# Patient Record
Sex: Female | Born: 1965 | Race: White | Hispanic: No | Marital: Married | Smoking: Never smoker
Health system: Southern US, Community
[De-identification: ages and names within clinical notes are randomized; demographics above are authoritative.]

## PROBLEM LIST (undated history)

## (undated) DIAGNOSIS — G4733 Obstructive sleep apnea (adult) (pediatric): Secondary | ICD-10-CM

## (undated) DIAGNOSIS — E039 Hypothyroidism, unspecified: Secondary | ICD-10-CM

## (undated) DIAGNOSIS — M313 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: Secondary | ICD-10-CM

## (undated) DIAGNOSIS — G8929 Other chronic pain: Secondary | ICD-10-CM

## (undated) DIAGNOSIS — G629 Polyneuropathy, unspecified: Secondary | ICD-10-CM

## (undated) DIAGNOSIS — R55 Syncope and collapse: Secondary | ICD-10-CM

## (undated) DIAGNOSIS — R053 Chronic cough: Secondary | ICD-10-CM

## (undated) DIAGNOSIS — R0600 Dyspnea, unspecified: Secondary | ICD-10-CM

## (undated) DIAGNOSIS — E876 Hypokalemia: Secondary | ICD-10-CM

## (undated) DIAGNOSIS — K219 Gastro-esophageal reflux disease without esophagitis: Secondary | ICD-10-CM

## (undated) DIAGNOSIS — Z978 Presence of other specified devices: Secondary | ICD-10-CM

## (undated) DIAGNOSIS — E119 Type 2 diabetes mellitus without complications: Secondary | ICD-10-CM

## (undated) DIAGNOSIS — I10 Essential (primary) hypertension: Secondary | ICD-10-CM

## (undated) DIAGNOSIS — M869 Osteomyelitis, unspecified: Secondary | ICD-10-CM

## (undated) DIAGNOSIS — M9080 Osteopathy in diseases classified elsewhere, unspecified site: Secondary | ICD-10-CM

## (undated) DIAGNOSIS — R05 Cough: Secondary | ICD-10-CM

## (undated) DIAGNOSIS — A5002 Early congenital syphilitic osteochondropathy: Secondary | ICD-10-CM

## (undated) HISTORY — PX: APPENDECTOMY: SHX54

## (undated) HISTORY — PX: CHOLECYSTECTOMY: SHX55

## (undated) HISTORY — PX: BACK SURGERY: SHX140

## (undated) HISTORY — PX: OTHER SURGICAL HISTORY: SHX169

## (undated) HISTORY — PX: VAGINAL HYSTERECTOMY: SUR661

---

## 1998-10-23 HISTORY — PX: HERNIA REPAIR: SHX51

## 1998-11-01 ENCOUNTER — Encounter: Payer: Self-pay | Admitting: *Deleted

## 1998-11-01 ENCOUNTER — Ambulatory Visit (HOSPITAL_COMMUNITY): Admission: RE | Admit: 1998-11-01 | Discharge: 1998-11-01 | Payer: Self-pay | Admitting: *Deleted

## 1998-11-08 ENCOUNTER — Ambulatory Visit (HOSPITAL_COMMUNITY): Admission: RE | Admit: 1998-11-08 | Discharge: 1998-11-08 | Payer: Self-pay | Admitting: *Deleted

## 1998-11-08 ENCOUNTER — Encounter: Payer: Self-pay | Admitting: *Deleted

## 2004-07-28 ENCOUNTER — Other Ambulatory Visit: Admission: RE | Admit: 2004-07-28 | Discharge: 2004-07-28 | Payer: Self-pay | Admitting: Family Medicine

## 2006-07-05 ENCOUNTER — Other Ambulatory Visit: Admission: RE | Admit: 2006-07-05 | Discharge: 2006-07-05 | Payer: Self-pay | Admitting: Family Medicine

## 2006-09-06 ENCOUNTER — Ambulatory Visit (HOSPITAL_COMMUNITY): Admission: RE | Admit: 2006-09-06 | Discharge: 2006-09-06 | Payer: Self-pay | Admitting: Obstetrics and Gynecology

## 2006-09-06 ENCOUNTER — Encounter (INDEPENDENT_AMBULATORY_CARE_PROVIDER_SITE_OTHER): Payer: Self-pay | Admitting: Specialist

## 2007-03-08 ENCOUNTER — Ambulatory Visit: Payer: Self-pay | Admitting: Pulmonary Disease

## 2007-03-08 ENCOUNTER — Ambulatory Visit: Payer: Self-pay | Admitting: Cardiology

## 2007-03-12 ENCOUNTER — Ambulatory Visit: Payer: Self-pay | Admitting: Pulmonary Disease

## 2007-03-13 ENCOUNTER — Encounter (INDEPENDENT_AMBULATORY_CARE_PROVIDER_SITE_OTHER): Payer: Self-pay | Admitting: Obstetrics and Gynecology

## 2007-03-13 ENCOUNTER — Ambulatory Visit (HOSPITAL_COMMUNITY): Admission: RE | Admit: 2007-03-13 | Discharge: 2007-03-14 | Payer: Self-pay | Admitting: Obstetrics and Gynecology

## 2007-10-24 DIAGNOSIS — M313 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: Secondary | ICD-10-CM

## 2007-10-24 HISTORY — DX: 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: M31.30

## 2008-01-09 ENCOUNTER — Ambulatory Visit (HOSPITAL_COMMUNITY): Admission: RE | Admit: 2008-01-09 | Discharge: 2008-01-09 | Payer: Self-pay | Admitting: *Deleted

## 2009-02-17 ENCOUNTER — Encounter: Admission: RE | Admit: 2009-02-17 | Discharge: 2009-02-17 | Payer: Self-pay | Admitting: Family Medicine

## 2009-07-20 ENCOUNTER — Encounter: Admission: RE | Admit: 2009-07-20 | Discharge: 2009-07-20 | Payer: Self-pay | Admitting: Family Medicine

## 2011-03-07 NOTE — Assessment & Plan Note (Signed)
[name]                             PULMONARY OFFICE NOTE   [name], [name]                          MRN:          [id number]  DATE:03/08/2007                            DOB:          1966-03-18    HISTORY OF PRESENT ILLNESS:  Patient is a very pleasant 45 year old  white female, whom I have been asked to see for persistent cough.  Patient states that she has had chronic cough for about two years in  varying severity.  She states that she coughs up a small amount of mucus  each day and feels that it occludes her throat and makes her very, very  hoarse.  Patient has had recent sinus surgery with Dr. Narda Bonds that  sounds like nasal septal reconstruction, but it is unclear whether she  had sinusitis at that time.  Those records are not available.  The  patient has been treated with multiple rounds of antibiotics, as well as  nasal spray, and really has not seen a big difference.  She feels that  she gets bronchitis approximately four to five times per year.  Patient denies postnasal drip and has rare reflux symptoms.  She denies  any chest symptoms, such as shortness of breath or tightness.  She  states that most of her problem is in her throat area.  She has no past  history of asthma and has had a chest x-ray at [facility]  two days ago that was totally within normal limits.   PAST MEDICAL HISTORY:  1. Significant for hypertension.  2. History of allergic rhinitis.  3. Status post sinus surgeries, unknown type.  4. Status post cholecystectomy.  5. Status post appendectomy.   CURRENT MEDICATIONS INCLUDE:  1. Micardis 40 mg daily.  2. Tessalon Perles p.r.n.   ALLERGIES:  The patient has no known drug allergies.   SOCIAL HISTORY:  She is married.  She has never smoked.  She works in  [employer].   FAMILY HISTORY:  Remarkable for her mother and father having heart  disease.   REVIEW OF SYSTEMS:   As per History of Present Illness.  Also see patient  intake form, documented on the chart.   PHYSICAL EXAM:  IN GENERAL:  She is an obese female, in no acute  distress.  Blood pressure 114/64, pulse 73, temperature is 97.8, weight is 202  pounds, O2 saturation on room air is 98%.  HEENT:  Pupils were equal, round and reactive to light and  accommodation.  Extraocular muscles are intact.  Nares are totally  occluded with purulence and crusting.  Oropharynx is clear.  NECK:  Supple without JVD or lymphadenopathy.  There is no palpable  thyromegaly.  CHEST:  Totally clear to auscultation.  CARDIAC EXAM:  Reveals regular rate and rhythm, no murmurs, rubs or  gallops.  ABDOMEN:  Soft, nontender, with good bowel sounds.  GENITAL EXAM/RECTAL EXAM/BREAST EXAM:  Not done and not indicated.  LOWER EXTREMITIES:  Without edema.  Pulses are intact distally.  NEUROLOGICALLY:  Alert and oriented, with no evidence  of motor deficits.   IMPRESSION:  Probable acute/chronic sinusitis.  The patient's history is  most consistent with this, and she does have purulence/crusting in her  nares.  Her chest is totally clear to auscultation and her chest x-ray  is clear.  I really do not think this is a pulmonary issue.   PLAN:  Limited CT scan of the sinuses to rule out acute/chronic  sinusitis.  If she does have this, she will probably need a prolonged  course of antibiotics of approximately three weeks, as well as  aggressive nasal hygiene.  The patient will follow up thereafter.     Barbaraann Share, MD,FCCP  Electronically Signed    [facility]/MedQ  DD: 03/08/2007  DT: 03/08/2007  Job #: [id number]   cc:   Holley Bouche, M.D.

## 2011-03-07 NOTE — H&P (Signed)
[name], [name]                 ACCOUNT NO.:  [id number]   MEDICAL RECORD NO.:  [id number]           PATIENT TYPE:   LOCATION:                                 FACILITY:   PHYSICIAN:  Charles A. Delcambre, MD    DATE OF BIRTH:   DATE OF ADMISSION:  03/13/2007  DATE OF DISCHARGE:                              HISTORY & PHYSICAL   This patient is to be admitted on May 21 to undergo transvaginal  hysterectomy, probably right oophorectomy, possible left oophorectomy  secondary to pelvic pain and menorrhagia. She gives informed consent,  accepts risk of infection, bleeding, bowel and bladder damage, blood  product risk including hepatitis and HIV exposure. All questions were  answered. We will proceed as outlined. She does not wish conversion to  an open procedure if we cannot get to the ovaries for proper evaluation.  She would prefer that we remove the right ovary with chronic right lower  quadrant pain, but mainly just to proceed with the vaginal procedure.   PAST MEDICAL HISTORY:  Hypertension.   PAST SURGICAL HISTORY:  1. Fatty tumors of leg.  2. Appendectomy.  3. Deviated sinus septum surgery.  4. Hernia surgery.  5. Hysteroscopy D&C.   MEDICATIONS:  1. Recently completed Levaquin and azithromycin, a course of Z-Pack      x2, for chronic cough.  2. Tussionex twice daily recently, not necessarily currently.  3. Micardis, dose not specified once a day.   REVIEW OF SYSTEMS:  Productive cough remains with minimal sputum, almost  like she gets a plug and then has to cough it out. She has taking  antibiotics as above and has seen Dr. Azucena Cecil with Dr. Tiburcio Pea, both at  [facility]. They thought that she possibly might have acid reflux  causing her cough. She has had this cough she states for many months and  had a negative chest x-ray.  She was given an inhaler of some sort, and  this did not help either. This cough is chronic in nature.   No fevers, chills, rashes, lesions,  headaches, dizziness. Some seasonal  allergies. Cough as noted above. No fever, chills, chest pain, shortness  of breath, wheezing, diarrhea, constipation, bleeding, melena,  hematochezia, urgency, frequency, dysuria, incontinence, hematuria,  galacturia, emotional changes.   ALLERGIES:  PENICILLIN REACTION NOT SPECIFIED.   SOCIAL HISTORY:  No tobacco, ethanol or drug use or STD exposure in the  past. She is now married and sexually active monogamously. Her husband  has a vasectomy.   FAMILY HISTORY:  Father age 63 in good health. Mother 78 recently died  approximately 2 months ago from sepsis, unknown etiology. Sister 26 with  MS. Brother 40, brother 65 in good health. Otherwise no family history  of breast, uterus, ovaries, cervix, or colon cancer or lymphoma,  coronary artery disease, stroke, diabetes, hypertension.   PHYSICAL EXAMINATION:  Blood pressure 110/80, respirations 16, pulse 88,  afebrile.  HEENT:  Grossly within normal limits.  NECK:  Supple without thyromegaly or adenopathy.  LUNGS:  Clear bilaterally.  BACK:  No CVAT. Vertebral  column nontender to palpation.  HEART:  Regular rate and rhythm without murmurs, rubs, or gallops.  BREASTS:  Symmetrical and large.  ABDOMEN:  Moderate obesity present. No hepatosplenomegaly or masses  noted.  PELVIC:  Normal external female genitalia. Bartholin, urethra and  Skene's within normal limits without discharge or lesions. Multiparous  cervix. Uterus is not enlarged. Descends with tenaculum to 1 to 2 and  the Valsalva to 1 to 2 cm from the introitus. She was somewhat tense  with the exam. Ovaries are not palpable secondary to the patient's body  habitus. I cannot detect any tenderness on bimanual examination.  RECTAL EXAM:  Not done. Anus and perineal body appear normal.   ASSESSMENT:  1. Menorrhagia.  2. Pelvic pain.   Hemoglobin on January 21, 2007, 12.4, but she has __________  passes large  clots. She did undergo  hysteroscopy and D&C with multiple polyps on  August 27, 2006 and has failed this therapy. Continues to have bleeding  as noted, and for this reason, we have proceeded with surgery.   PLAN:  N.p.o. past midnight the evening prior to surgery. Urine  pregnancy test, CBC, basic metabolic profile. As she is allergic to  penicillin, we will go ahead and preop with clindamycin and gentamycin  protocol. SCDs will be used to the OR. She will go ahead and take her  Micardis the morning prior to surgery with a sip of water, and all  questions were answered, and we will proceed as directed.      Charles A. Sydnee Cabal, MD  Electronically Signed     CAD/MEDQ  D:  03/04/2007  T:  03/05/2007  Job:  [id number]

## 2011-03-10 NOTE — H&P (Signed)
NAME:  [name], [name]                   ACCOUNT NO.:  [id number]   MEDICAL RECORD NO.:  [id number]          PATIENT TYPE:  AMB   LOCATION:  SDC                           FACILITY:  [facility]   PHYSICIAN:  Charles A. Delcambre, MDDATE OF BIRTH:  [birth year]   DATE OF ADMISSION:  02/06/2007  DATE OF DISCHARGE:                              HISTORY & PHYSICAL   The patient will be admitted on February 06, 2007, to undergo transvaginal  hysterectomy, probable right oophorectomy, possible left oophorectomy  secondary to pelvic pain and menorrhagia.  She gives informed consent,  accepts the risk of infection, bleeding, bowel or bladder damage, blood  product risk including hepatitis and HIV exposure.  All questions are  answered and we will proceed as outlined.  She does not wish conversion  to an open procedure if we cannot get to the ovaries for proper  evaluation.  She would prefer that we remove the right ovary with  chronic right lower quadrant pain but mainly just to proceed with the  vaginal procedure.   PAST MEDICAL HISTORY:  Hypertension.   PAST SURGICAL HISTORY:  1. Fatty tumors of the leg.  2. Appendectomy.  3. Deviated sinus septum surgery.  4. Hernia surgery.  5. Hysteroscopy D&C.   MEDICATIONS:  1. Levaquin 750 mg once a day for bronchitis, recently not currently.  2. Tussionex twice daily, recently not currently.  3. Micardis dose not specified once a day.   REVIEW OF SYSTEMS:  Productive cough with yellow sputum.  I have given  her a Z-Pak as well as some Tantum to take once a day until surgery.  Also given her Premarin 0.625 once a day for five days.  She will finish  the Provera 20 mg a day for two days, and then after the five days of  the Premarin go to Prempro 0.45/1.5 for 10 days leading up to surgery.   ALLERGIES:  PENICILLIN, REACTION NOT SPECIFIED.   SOCIAL HISTORY:  No tobacco, ethanol, drug use, or STD exposure in the  past.  She is married, sexually active  monogamously.  Her husband has a  vasectomy.   FAMILY HISTORY:  Father age 60, good health.  Mother 28, recently died  approximately 3 weeks ago of sepsis, unknown etiology.  Sister, age 40  with MS.  Brother 39, brother 49 good health.  Otherwise no family  history of breast, uterus, ovary, cervix, colon cancer, lymphoma,  coronary artery disease, stroke, diabetes, hypertension.   REVIEW OF SYSTEMS:  No fever, chills, rashes, lesions, headaches,  dizziness, some seasonal allergies are present.  She does have a cough  productive for the last week as noted above, yellow sputum.  No fever,  chills, no chest pain, no wheezing, no diarrhea, constipation, bleeding,  melena, hematochezia, urgency, frequency, dysuria, incontinence,  hematuria, no galactorrhea or emotional changes.   PHYSICAL EXAMINATION:  GENERAL:  Alert and oriented x3, no distress.  VITAL SIGNS:  Blood pressure 130/90, respirations 20, heart rate 80,  afebrile.  HEENT:  Grossly within normal limits.  NECK:  Supple without thyromegaly  or adenopathy.  LUNGS:  Clear bilaterally.  BACK:  No CVAT.  HEART:  Regular rate and rhythm without murmur, rub, or gallop.  BREASTS:  Symmetrical and large.  ABDOMEN:  Moderate obesity present.  No hepatosplenomegaly or masses  noted.  PELVIC:  Normal external female genitalia.  Bartholin, urethral, and  Skene within normal limits.  Vault without discharge or lesions.  Multiparous cervix.  Uterus is not enlarged.  Descends well with  tenaculum traction to 1-to-2-cm from the introitus.  She was somewhat  tense with the exam.  Ovaries are not palpable secondary to the  patient's body habitus.  I could detect no tenderness on bimanual  examination:  RECTAL:  Not done.  Anus and perineal body appear normal.   ASSESSMENT:  1. Menorrhagia.  2. Pelvic pain.   PLAN:  Transvaginal hysterectomy, probable right oophorectomy, possible  left oophorectomy.  All questions were answered.  She  accepts risks as  noted above.  Preoperative CBC, urine pregnancy test, and B-MET.  Two  grams Cytoxan and on call to the OR if penicillin allergy is minimal,  otherwise we will give clindamycin gentamicin and SCDs will be used to  the OR.  Questions were answered, and we will proceed as outlined.  She  will remain NPO past surgery.  The surgery is moved up from Mar 13, 2007  to February 06, 2007.  Marland Kitchen      Charles A. Sydnee Cabal, MD  Electronically Signed     CAD/MEDQ  D:  01/21/2007  T:  01/21/2007  Job:  [id number]

## 2011-03-10 NOTE — Op Note (Signed)
NAME:  [name], [name]                   ACCOUNT NO.:  [id number]   MEDICAL RECORD NO.:  [id number]          PATIENT TYPE:  AMB   LOCATION:  SDC                           FACILITY:  [facility]   PHYSICIAN:  Charles A. Delcambre, MDDATE OF BIRTH:  [birth year]   DATE OF PROCEDURE:  09/06/2006  DATE OF DISCHARGE:                                 OPERATIVE REPORT   PREOPERATIVE DIAGNOSIS:  Menorrhagia.   POSTOPERATIVE DIAGNOSIS:  Menorrhagia plus endometrial polyp.   OPERATION PERFORMED:  1. Hysteroscopy.  2. Dilation and curettage.  3. Paracervical block.   SURGEON:  Charles A. Delcambre, MD   ASSISTANT:  None.   ANESTHESIA:  General by the laryngeal route.   COMPLICATIONS:  None.   ESTIMATED BLOOD LOSS:  Less than 25 mL.   SPECIMENS:  1. Endometrial polyp.  2. Endometrial curettings to pathology.   OPERATIVE FINDINGS:  Small fundal endometrial polyp, shaggy endometrium,  otherwise, sorbitol loss 0 mL.   Sponge, needle and instrument counts were correct x2.   DESCRIPTION OF PROCEDURE:  The patient was taken to the operating room and  placed in supine position and anesthesia was induced without difficulty. She  was then placed in dorsal lithotomy position in universal stirrups and  sterile prep and drape was undertaken.  Weighted speculum was placed in the  vagina, anterior lip of the cervix was grasped with a single toothed  tenaculum.  Paracervical block was placed.  0.25% plain Marcaine divided  equally at 4 and 8 o'clock.  There was no evidence of intravascular location  of injection.  Sound was to 9 cm.  Hanks dilators were used to dilate up to  Hanks 16 without perforation.  5 mm hysteroscope was placed and there was no  evidence of perforation.  Hysteroscopic findings were noted above.  Polyp  forceps were used to remove the polyp without  difficulty and generalized curettage with small banjo curette yielded  moderate amount of tissue.  There was no evidence of perforation  during this  portion of the procedure as well or at any point.  All instruments were  removed. Hemostasis was adequate and the patient was taken to recovery with  physician in attendance.      Charles A. Sydnee Cabal, MD  Electronically Signed     CAD/MEDQ  D:  09/06/2006  T:  09/06/2006  Job:  [id number]

## 2011-03-10 NOTE — Op Note (Signed)
NAME:  [name], [name]                 ACCOUNT NO.:  [id number]   MEDICAL RECORD NO.:  [id number]          PATIENT TYPE:  INP   LOCATION:  9304                          FACILITY:  [facility]   PHYSICIAN:  Charles A. Delcambre, MDDATE OF BIRTH:  Jan 20, 1966   DATE OF PROCEDURE:  03/13/2007  DATE OF DISCHARGE:                               OPERATIVE REPORT   PREOPERATIVE DIAGNOSES:  1. Menorrhagia.  2. Pelvic pain.   POSTOPERATIVE DIAGNOSES:  1. Menorrhagia.  2. Pelvic pain.   PROCEDURES:  1. Transvaginal hysterectomy.  2. Right salpingo-oophorectomy.   SURGEON:  Charles A. Sydnee Cabal, MD.   ASSISTANT:  Gerald Leitz, MD.   COMPLICATIONS:  None.   ESTIMATED BLOOD LOSS:  50 mL.   ANESTHESIA:  General by the endotracheal route.   SPECIMENS:  Uterus, right tube and ovary to pathology.   INSTRUMENT:  Sponge and needle correct x2.   DESCRIPTION OF PROCEDURE:  The patient was taken to the operating room  and general anesthetic was induced without difficulty.  She was then  sterilely prepped and draped in dorsal lithotomy position.  Lahey clamps  were placed on the cervix.  Lidocaine 1% with epinephrine was injected,  a total of approximately 15 mL subcutaneously circumferentially around  the cervix.  A knife was used to make a scoring incision around the  cervix.  Bladder pillars were cut.  The bladder mucosa was taken off the  lower uterine segment.  RayTec was packed into this site to help with  further dissection without difficulty.  Peritoneum was entered with  Metzenbaum scissors.  Bowel was seen clearly.  There was no damage to  the bladder noted.  A posterior colpotomy was done, stretched and a long  weighted speculum was placed.  The uterosacral ligaments were taken  bilaterally with 0 Vicryl, transfixion stitches held.  Uterine vessels  were taken bilaterally with simple stitch and hemostasis was excellent.  Zero Vicryl was used on all sutures.  Remaining cardinal ligaments were  taken up either side, transfixed, and stitched with 0 Vicryl.  Hemostasis was excellent.  Clamps were across the uterine corner region  and the utero-ovarian pedicle on the left this was taken, pretied with 0  Vicryl, and then transfixed.  Fixation held.  Zero Vicryl was used to  close the peritoneal window with a transfixion stitch without  difficulty.  Hemostasis was excellent.  Right cornua region was cross  clamped, uterus was excised, a free tie was placed and then transfixed.  A stitch with 0 Vicryl was placed for good hemostasis.  Right ovary had  a corpus luteum bleeding very minimally.  This was cauterized with  electrocautery 40 watts coag without damage to surrounding tissue.  On  the right, again a 2-0 Vicryl transfixion stitched to close the  peritoneal window.  Kelly clamp was placed across the infundibulopelvic  pedicle, and the uterus and ovary were excised per preoperative  instructions and plan.  The free tie was placed, transfixed, a stitch  was placed.  Hemostasis was excellent.  All pedicles were visualized and  noted to be  of excellent hemostasis.  Cuff was enclosed with Richardson  angle sutures x2, 0-  Vicryl, running-locked suture of 0 Vicryl was used  to close the cuff.  Hemostasis was excellent.  Patient was extubated,  taken to recovery with physician in attendance, having tolerated the  procedure well.      Charles A. Sydnee Cabal, MD  Electronically Signed     CAD/MEDQ  D:  03/13/2007  T:  03/13/2007  Job:  [id number]

## 2011-03-24 DIAGNOSIS — G4730 Sleep apnea, unspecified: Secondary | ICD-10-CM | POA: Insufficient documentation

## 2011-03-24 DIAGNOSIS — M313 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: Secondary | ICD-10-CM | POA: Insufficient documentation

## 2011-03-24 DIAGNOSIS — J386 Stenosis of larynx: Secondary | ICD-10-CM | POA: Insufficient documentation

## 2011-03-24 DIAGNOSIS — R059 Cough, unspecified: Secondary | ICD-10-CM | POA: Insufficient documentation

## 2011-03-24 DIAGNOSIS — R05 Cough: Secondary | ICD-10-CM | POA: Insufficient documentation

## 2011-06-13 ENCOUNTER — Other Ambulatory Visit: Payer: Self-pay | Admitting: Dermatology

## 2011-07-25 DIAGNOSIS — J3489 Other specified disorders of nose and nasal sinuses: Secondary | ICD-10-CM | POA: Insufficient documentation

## 2011-08-30 DIAGNOSIS — J988 Other specified respiratory disorders: Secondary | ICD-10-CM | POA: Insufficient documentation

## 2011-08-30 DIAGNOSIS — J398 Other specified diseases of upper respiratory tract: Secondary | ICD-10-CM | POA: Insufficient documentation

## 2012-03-07 DIAGNOSIS — K219 Gastro-esophageal reflux disease without esophagitis: Secondary | ICD-10-CM | POA: Insufficient documentation

## 2013-02-20 HISTORY — PX: SEPTOPLASTY: SUR1290

## 2013-05-30 ENCOUNTER — Emergency Department (HOSPITAL_COMMUNITY): Payer: BC Managed Care – PPO

## 2013-05-30 ENCOUNTER — Encounter (HOSPITAL_COMMUNITY): Payer: Self-pay | Admitting: Emergency Medicine

## 2013-05-30 ENCOUNTER — Inpatient Hospital Stay (HOSPITAL_COMMUNITY)
Admission: EM | Admit: 2013-05-30 | Discharge: 2013-06-03 | DRG: 174 | Disposition: A | Discharge status: Home / Self Care | Payer: BC Managed Care – PPO | Attending: Internal Medicine | Admitting: Internal Medicine

## 2013-05-30 DIAGNOSIS — D72829 Elevated white blood cell count, unspecified: Secondary | ICD-10-CM

## 2013-05-30 DIAGNOSIS — R42 Dizziness and giddiness: Secondary | ICD-10-CM | POA: Diagnosis present

## 2013-05-30 DIAGNOSIS — R51 Headache: Secondary | ICD-10-CM | POA: Diagnosis not present

## 2013-05-30 DIAGNOSIS — E2740 Unspecified adrenocortical insufficiency: Secondary | ICD-10-CM | POA: Insufficient documentation

## 2013-05-30 DIAGNOSIS — Z79899 Other long term (current) drug therapy: Secondary | ICD-10-CM | POA: Insufficient documentation

## 2013-05-30 DIAGNOSIS — A5009 Other early congenital syphilis, symptomatic: Secondary | ICD-10-CM | POA: Diagnosis present

## 2013-05-30 DIAGNOSIS — K648 Other hemorrhoids: Secondary | ICD-10-CM | POA: Diagnosis present

## 2013-05-30 DIAGNOSIS — D649 Anemia, unspecified: Secondary | ICD-10-CM

## 2013-05-30 DIAGNOSIS — E2749 Other adrenocortical insufficiency: Secondary | ICD-10-CM | POA: Insufficient documentation

## 2013-05-30 DIAGNOSIS — W19XXXA Unspecified fall, initial encounter: Secondary | ICD-10-CM | POA: Insufficient documentation

## 2013-05-30 DIAGNOSIS — IMO0002 Reserved for concepts with insufficient information to code with codable children: Secondary | ICD-10-CM

## 2013-05-30 DIAGNOSIS — R55 Syncope and collapse: Secondary | ICD-10-CM | POA: Insufficient documentation

## 2013-05-30 DIAGNOSIS — A5002 Early congenital syphilitic osteochondropathy: Secondary | ICD-10-CM

## 2013-05-30 DIAGNOSIS — K625 Hemorrhage of anus and rectum: Secondary | ICD-10-CM

## 2013-05-30 DIAGNOSIS — I959 Hypotension, unspecified: Secondary | ICD-10-CM

## 2013-05-30 DIAGNOSIS — T380X5A Adverse effect of glucocorticoids and synthetic analogues, initial encounter: Secondary | ICD-10-CM | POA: Diagnosis present

## 2013-05-30 DIAGNOSIS — E876 Hypokalemia: Secondary | ICD-10-CM | POA: Insufficient documentation

## 2013-05-30 DIAGNOSIS — G4733 Obstructive sleep apnea (adult) (pediatric): Secondary | ICD-10-CM | POA: Diagnosis present

## 2013-05-30 DIAGNOSIS — G609 Hereditary and idiopathic neuropathy, unspecified: Secondary | ICD-10-CM | POA: Diagnosis present

## 2013-05-30 DIAGNOSIS — D638 Anemia in other chronic diseases classified elsewhere: Secondary | ICD-10-CM | POA: Diagnosis present

## 2013-05-30 DIAGNOSIS — I10 Essential (primary) hypertension: Secondary | ICD-10-CM | POA: Diagnosis present

## 2013-05-30 HISTORY — DX: Cough: R05

## 2013-05-30 HISTORY — DX: Chronic cough: R05.3

## 2013-05-30 HISTORY — DX: Type 2 diabetes mellitus without complications: E11.9

## 2013-05-30 HISTORY — DX: Polyneuropathy, unspecified: G62.9

## 2013-05-30 HISTORY — DX: Early congenital syphilitic osteochondropathy: M90.80

## 2013-05-30 HISTORY — DX: Early congenital syphilitic osteochondropathy: A50.02

## 2013-05-30 HISTORY — DX: Obstructive sleep apnea (adult) (pediatric): G47.33

## 2013-05-30 HISTORY — DX: Essential (primary) hypertension: I10

## 2013-05-30 HISTORY — DX: 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: M31.30

## 2013-05-30 LAB — CBC WITH DIFFERENTIAL/PLATELET
Basophils Relative: 0 % (ref 0–1)
Hemoglobin: 11.8 g/dL — ABNORMAL LOW (ref 12.0–15.0)
Lymphs Abs: 4.5 10*3/uL — ABNORMAL HIGH (ref 0.7–4.0)
Monocytes Relative: 5 % (ref 3–12)
Neutro Abs: 12.1 10*3/uL — ABNORMAL HIGH (ref 1.7–7.7)
Neutrophils Relative %: 68 % (ref 43–77)
RBC: 4.47 MIL/uL (ref 3.87–5.11)
WBC: 17.7 10*3/uL — ABNORMAL HIGH (ref 4.0–10.5)

## 2013-05-30 LAB — CBC
Platelets: 490 10*3/uL — ABNORMAL HIGH (ref 150–400)
RDW: 17.4 % — ABNORMAL HIGH (ref 11.5–15.5)
WBC: 14.2 10*3/uL — ABNORMAL HIGH (ref 4.0–10.5)

## 2013-05-30 LAB — COMPREHENSIVE METABOLIC PANEL
ALT: 21 U/L (ref 0–35)
Alkaline Phosphatase: 128 U/L — ABNORMAL HIGH (ref 39–117)
BUN: 11 mg/dL (ref 6–23)
CO2: 28 mEq/L (ref 19–32)
GFR calc Af Amer: 65 mL/min — ABNORMAL LOW (ref >90)
GFR calc non Af Amer: 56 mL/min — ABNORMAL LOW (ref >90)
Glucose, Bld: 103 mg/dL — ABNORMAL HIGH (ref 70–99)
Potassium: 3.1 mEq/L — ABNORMAL LOW (ref 3.5–5.1)
Sodium: 135 mEq/L (ref 135–145)
Total Bilirubin: 0.2 mg/dL — ABNORMAL LOW (ref 0.3–1.2)
Total Protein: 7.1 g/dL (ref 6.0–8.3)

## 2013-05-30 LAB — URINALYSIS, ROUTINE W REFLEX MICROSCOPIC
Glucose, UA: NEGATIVE mg/dL
Hgb urine dipstick: NEGATIVE
Specific Gravity, Urine: 1.007 (ref 1.005–1.030)
Urobilinogen, UA: 0.2 mg/dL (ref 0.0–1.0)

## 2013-05-30 LAB — OCCULT BLOOD, POC DEVICE: Fecal Occult Bld: POSITIVE — AB

## 2013-05-30 LAB — POCT PREGNANCY, URINE: Preg Test, Ur: POSITIVE — AB

## 2013-05-30 LAB — PROTIME-INR
INR: 1.01 (ref 0.00–1.49)
Prothrombin Time: 13.1 seconds (ref 11.6–15.2)

## 2013-05-30 MED ORDER — HYDROCORTISONE SOD SUCCINATE 100 MG IJ SOLR
100.0000 mg | Freq: Once | INTRAMUSCULAR | Status: AC
  Administered 2013-05-30: 100 mg via INTRAVENOUS
  Filled 2013-05-30: qty 2

## 2013-05-30 MED ORDER — ALBUTEROL SULFATE (5 MG/ML) 0.5% IN NEBU: 2.5000 mg | INHALATION_SOLUTION | RESPIRATORY_TRACT | Status: DC | PRN

## 2013-05-30 MED ORDER — PANTOPRAZOLE SODIUM 40 MG PO TBEC
40.0000 mg | DELAYED_RELEASE_TABLET | Freq: Every day | ORAL | Status: DC
  Administered 2013-05-30 – 2013-06-03 (×5): 40 mg via ORAL
  Filled 2013-05-30 (×5): qty 1

## 2013-05-30 MED ORDER — ZOLPIDEM TARTRATE 5 MG PO TABS
5.0000 mg | ORAL_TABLET | Freq: Every day | ORAL | Status: DC
  Administered 2013-05-30 – 2013-06-02 (×4): 5 mg via ORAL
  Filled 2013-05-30 (×4): qty 1

## 2013-05-30 MED ORDER — POTASSIUM CHLORIDE IN NACL 40-0.9 MEQ/L-% IV SOLN
INTRAVENOUS | Status: DC
  Administered 2013-05-30 – 2013-05-31 (×2): 125 mL/h via INTRAVENOUS
  Administered 2013-05-31: 01:00:00 via INTRAVENOUS
  Filled 2013-05-30 (×4): qty 1000

## 2013-05-30 MED ORDER — INSULIN ASPART 100 UNIT/ML ~~LOC~~ SOLN
0.0000 [IU] | Freq: Three times a day (TID) | SUBCUTANEOUS | Status: DC
  Administered 2013-05-31 – 2013-06-03 (×4): 1 [IU] via SUBCUTANEOUS

## 2013-05-30 MED ORDER — SODIUM CHLORIDE 0.9 % IV SOLN: INTRAVENOUS | Status: DC

## 2013-05-30 MED ORDER — SULFAMETHOXAZOLE-TMP DS 800-160 MG PO TABS
1.0000 | ORAL_TABLET | ORAL | Status: DC
  Administered 2013-06-02: 1 via ORAL
  Filled 2013-05-30: qty 1

## 2013-05-30 MED ORDER — ACETAMINOPHEN 650 MG RE SUPP: 650.0000 mg | Freq: Four times a day (QID) | RECTAL | Status: DC | PRN

## 2013-05-30 MED ORDER — POTASSIUM CHLORIDE CRYS ER 20 MEQ PO TBCR
40.0000 meq | EXTENDED_RELEASE_TABLET | Freq: Once | ORAL | Status: AC
  Administered 2013-05-30: 40 meq via ORAL
  Filled 2013-05-30: qty 2

## 2013-05-30 MED ORDER — HYDROCORTISONE SOD SUCCINATE 100 MG IJ SOLR
50.0000 mg | Freq: Three times a day (TID) | INTRAMUSCULAR | Status: DC
  Administered 2013-05-31 – 2013-06-01 (×5): 50 mg via INTRAVENOUS
  Filled 2013-05-30 (×8): qty 1

## 2013-05-30 MED ORDER — SODIUM CHLORIDE 0.9 % IJ SOLN
3.0000 mL | Freq: Two times a day (BID) | INTRAMUSCULAR | Status: DC
  Administered 2013-05-31 – 2013-06-03 (×5): 3 mL via INTRAVENOUS

## 2013-05-30 MED ORDER — ACETAMINOPHEN 325 MG PO TABS
650.0000 mg | ORAL_TABLET | Freq: Four times a day (QID) | ORAL | Status: DC | PRN
  Administered 2013-05-31 – 2013-06-02 (×5): 650 mg via ORAL
  Filled 2013-05-30 (×5): qty 2

## 2013-05-30 MED ORDER — IOHEXOL 300 MG/ML  SOLN
25.0000 mL | INTRAMUSCULAR | Status: DC | PRN
  Administered 2013-05-30: 25 mL via ORAL

## 2013-05-30 MED ORDER — IOHEXOL 300 MG/ML  SOLN
100.0000 mL | Freq: Once | INTRAMUSCULAR | Status: AC | PRN
  Administered 2013-05-30: 100 mL via INTRAVENOUS

## 2013-05-30 MED ORDER — DULOXETINE HCL 60 MG PO CPEP
60.0000 mg | ORAL_CAPSULE | Freq: Every day | ORAL | Status: DC
  Administered 2013-05-30: 60 mg via ORAL
  Filled 2013-05-30 (×2): qty 1

## 2013-05-30 MED ORDER — PAROXETINE HCL 20 MG PO TABS
20.0000 mg | ORAL_TABLET | Freq: Every morning | ORAL | Status: DC
  Administered 2013-05-31 – 2013-06-03 (×4): 20 mg via ORAL
  Filled 2013-05-30 (×4): qty 1

## 2013-05-30 MED ORDER — ACETAMINOPHEN 325 MG PO TABS
975.0000 mg | ORAL_TABLET | Freq: Once | ORAL | Status: AC
  Administered 2013-05-30: 975 mg via ORAL
  Filled 2013-05-30: qty 3

## 2013-05-30 MED ORDER — DULOXETINE HCL 60 MG PO CPEP: 60.0000 mg | ORAL_CAPSULE | Freq: Every day | ORAL | Status: DC

## 2013-05-30 MED ORDER — FOLIC ACID 1 MG PO TABS
1.0000 mg | ORAL_TABLET | Freq: Every day | ORAL | Status: DC
  Administered 2013-05-31 – 2013-06-03 (×4): 1 mg via ORAL
  Filled 2013-05-30 (×4): qty 1

## 2013-05-30 MED ORDER — ONDANSETRON HCL 4 MG PO TABS: 4.0000 mg | ORAL_TABLET | Freq: Four times a day (QID) | ORAL | Status: DC | PRN

## 2013-05-30 MED ORDER — SODIUM CHLORIDE 0.9 % IV BOLUS (SEPSIS)
2000.0000 mL | Freq: Once | INTRAVENOUS | Status: AC
  Administered 2013-05-30: 2000 mL via INTRAVENOUS

## 2013-05-30 MED ORDER — SODIUM CHLORIDE 0.9 % IV BOLUS (SEPSIS)
1000.0000 mL | Freq: Once | INTRAVENOUS | Status: AC
  Administered 2013-05-30: 1000 mL via INTRAVENOUS

## 2013-05-30 MED ORDER — ONDANSETRON HCL 4 MG/2ML IJ SOLN: 4.0000 mg | Freq: Four times a day (QID) | INTRAMUSCULAR | Status: DC | PRN

## 2013-05-30 NOTE — ED Notes (Signed)
Urine preg charted in error.

## 2013-05-30 NOTE — ED Notes (Signed)
Pt ambulated to restroom with no issues.  

## 2013-05-30 NOTE — ED Provider Notes (Signed)
CSN: [id number]     Arrival date & time 05/30/13  1023 History     First MD Initiated Contact with Patient 05/30/13 1026     No chief complaint on file.  (Consider location/radiation/quality/duration/timing/severity/associated sxs/prior Treatment) The history is provided by the patient and medical records. No language interpreter was used.    [name] is a(n) 47 y.o. female with a history of Wegener's granulomatosis who presents the emergency department chief complaint of rectal bleeding, weakness and joint pain.  Patient's primary care physician is Dr. Willa Rough at [facility].  The patient is followed at the [facility] clinic for her Wegner's granulomatosis.  She is on long-term steroid use and is currently using 5 mg daily.  Patient states that she has peripheral neuropathy of the feet bilaterally with constant burning pain.  She recently underwent I nerve biopsy at the [facility] clinic.  She is currently waiting on results.  She states that 3 weeks ago she was started on Topamax  for neuropathy.  He said that during the week she began having some intermittent rectal bleeding of bright red blood with her bowel movements.  Mostly on her toilet paper after wiping.  She also experienced dizziness, heart palpitations, myalgias and arthralgias.  She states is typical for her for her to carry her handbag she has difficulty raising her arms above her head to wash her hair.  She stopped taking the Topamax after one week but continues to have symptoms of rectal bleeding, weakness, dizziness, arthralgias and myalgias. Patient also complains of 3 weeks of productive cough with green phlegm.  She has a chronic cough and upper respiratory symptoms due to her [place] is however this is different and new. Patient states that this morning when she went to use the restroom she had increased bleeding at occurred not only on her toilet paper but also bled into the toilet bowl.  She estimates is approximately a  quarter cup of blood.  No clotting.  She has no known history of hemorrhoids, inflammatory bowel disease.  She denies pain with defecation.  The patient denies fevers but has had intermittent chills.  She states she has had soaking night sweats for the past year and has mentioned this to previous providers.  She denies unexplained weight loss.   No past medical history on file. No past surgical history on file. No family history on file. History  Substance Use Topics  . Smoking status: Not on file  . Smokeless tobacco: Not on file  . Alcohol Use: Not on file   OB History   No data available     Review of Systems  Constitutional: Positive for chills. Negative for fever and unexpected weight change.  HENT: Negative for trouble swallowing.   Eyes: Negative for visual disturbance.  Respiratory: Positive for cough. Negative for wheezing.   Gastrointestinal: Positive for anal bleeding. Negative for nausea, vomiting and abdominal pain.  Genitourinary: Negative for dysuria, hematuria and flank pain.  Musculoskeletal: Negative for myalgias and arthralgias.  Skin: Negative for rash and wound.  Neurological: Positive for seizures and weakness. Negative for tremors, syncope, facial asymmetry, speech difficulty, light-headedness and headaches.  All other systems reviewed and are negative.    Allergies  Review of patient's allergies indicates not on file.  Home Medications  No current outpatient prescriptions on file. There were no vitals taken for this visit. Physical Exam  Vitals reviewed. Constitutional: She is oriented to person, place, and time. She appears well-developed and well-nourished. No distress.  HENT:  Head: Normocephalic and atraumatic.  Eyes: Conjunctivae are normal. No scleral icterus.  Neck: Normal range of motion.  Cardiovascular: Normal rate, regular rhythm and normal heart sounds.  Exam reveals no gallop and no friction rub.   No murmur heard. Pulmonary/Chest:  Effort normal and breath sounds normal. No respiratory distress.  Abdominal: Soft. Bowel sounds are normal. She exhibits no distension and no mass. There is no tenderness. There is no guarding.  Genitourinary:  Digital Rectal Exam reveals sphincter with good tone. Non-thrombosed external hemorrhoids. No masses or fissures. Stool color is brown with no overt blood. She has pain to palpation on rectal exam at 9 o'clock   Neurological: She is alert and oriented to person, place, and time.  Skin: Skin is warm and dry. She is not diaphoretic.    ED Course   Procedures (including critical care time)  Labs Reviewed - No data to display No results found. 1. Rectal bleeding   2. Hypotension     Date: 05/30/2013  Rate: 85  Rhythm: normal sinus rhythm  QRS Axis: normal  Intervals: normal  ST/T Wave abnormalities: normal  Conduction Disutrbances: none  Narrative Interpretation:   Old EKG Reviewed: none available  ; MDM  11:45 AM BP 127/84  Pulse 85  Temp(Src) 98.5 F (36.9 C) (Oral)  Resp 20  Ht 5\' 6"  (1.676 m)  Wt 232 lb (105.235 kg)  BMI 37.46 kg/m2  SpO2 97% Patient with multiple coplaints, Hemoccult is positive. I did not feel any rectal mass.    1:06 PM Patient + orthostatic vs. CT pending. She will be given IV fluids.   3:14 PM BP 103/63  Pulse 77  Temp(Src) 98.5 F (36.9 C) (Oral)  Resp 19  Ht 5\' 6"  (1.676 m)  Wt 232 lb (105.235 kg)  BMI 37.46 kg/m2  SpO2 98% Patient given 2 L of fluid, she continues to be hypotensive. She has rectal bleeding. Patient is hypokalemic with diarrhea. She takes losartan and potassium daily and states that her pressures normally run in 140/90. She took her medication this morning. She has had diarrhea. The patient has been on prednisone for the past 5 years. Dropped from 10mg  to 5mg  1 month ago. I have discussed the case with Dr. Blinda Leatherwood. We have concern for adrenal crisis. I have spoken with Dr. Waymon Amato who will admit the patient.  He asks that I begin the patient on cortisone and asks that I consult GI regarding her rectal bleeding.      3:45 PM I spoke with Dr. Randa Evens who is on-call for gastroenterology.  He asks that we begin patient on PPI and he will consult on the patient.  Arthor Captain, PA-C 05/31/13 2141

## 2013-05-30 NOTE — Consult Note (Signed)
Lab Results  Component Value Date   HGB 10.3* 05/30/2013   HGB 11.8* 05/30/2013   HCT 31.9* 05/30/2013   HCT 36.3 05/30/2013   ALKPHOS 128* 05/30/2013   AST 20 05/30/2013   ALT 21 05/30/2013   Subjective:   HPI  The patient is a 47 year old female with a history of Wegener's disease. She was admitted to the hospital complaining of feeling weak and she was hypotensive in the emergency room. She has been on chronic steroid therapy. We are asked to see her in regards to rectal bleeding. She states that she has been having intermittent rectal bleeding of small amount over the past 3-4 weeks. She denies hemorrhoids and has never had a colonoscopy.  Review of Systems No complaints of chest pain or shortness of breath  Past Medical History  Diagnosis Date  . Chronic cough   . Wegner's disease (congenital syphilitic osteochondritis)   . Hypertension   . Diabetes mellitus without complication   . OSA (obstructive sleep apnea)   . Peripheral neuropathy    Past Surgical History  Procedure Laterality Date  . Cholecystectomy    . Appendectomy    . Vaginal hysterectomy    . Splenic aneurysm     History   Social History  . Marital Status: Married    Spouse Name: N/A    Number of Children: N/A  . Years of Education: N/A   Occupational History  . Not on file.   Social History Main Topics  . Smoking status: Never Smoker   . Smokeless tobacco: Never Used  . Alcohol Use: Yes  . Drug Use: No  . Sexually Active: Not on file   Other Topics Concern  . Not on file   Social History Narrative  . No narrative on file   family history includes Diabetes in her father and mother and Heart disease in her mother. Current facility-administered medications:0.9 % NaCl with KCl 40 mEq / L  infusion, , Intravenous, Continuous, Elease Etienne, MD, Last Rate: 125 mL/hr at 05/30/13 1751, 125 mL/hr at 05/30/13 1751;  acetaminophen (TYLENOL) suppository 650 mg, 650 mg, Rectal, Q6H PRN, Elease Etienne, MD;   acetaminophen (TYLENOL) tablet 650 mg, 650 mg, Oral, Q6H PRN, Elease Etienne, MD albuterol (PROVENTIL) (5 MG/ML) 0.5% nebulizer solution 2.5 mg, 2.5 mg, Nebulization, Q2H PRN, Elease Etienne, MD;  Melene Muller ON 05/31/2013] DULoxetine (CYMBALTA) DR capsule 60 mg, 60 mg, Oral, Daily, Elease Etienne, MD;  Melene Muller ON 05/31/2013] folic acid (FOLVITE) tablet 1 mg, 1 mg, Oral, Daily, Elease Etienne, MD;  hydrocortisone sodium succinate (SOLU-CORTEF) 100 mg/2 mL injection 50 mg, 50 mg, Intravenous, Q8H, Elease Etienne, MD [START ON 05/31/2013] insulin aspart (novoLOG) injection 0-9 Units, 0-9 Units, Subcutaneous, TID WC, Elease Etienne, MD;  ondansetron (ZOFRAN) injection 4 mg, 4 mg, Intravenous, Q6H PRN, Elease Etienne, MD;  ondansetron (ZOFRAN) tablet 4 mg, 4 mg, Oral, Q6H PRN, Elease Etienne, MD;  pantoprazole (PROTONIX) EC tablet 40 mg, 40 mg, Oral, Daily, Arthor Captain, PA-C, 40 mg at 05/30/13 1549 [START ON 05/31/2013] PARoxetine (PAXIL) tablet 20 mg, 20 mg, Oral, q morning - 10a, Anand D Hongalgi, MD;  sodium chloride 0.9 % injection 3 mL, 3 mL, Intravenous, Q12H, Elease Etienne, MD;  Melene Muller ON 06/02/2013] sulfamethoxazole-trimethoprim (BACTRIM DS) 800-160 MG per tablet 1 tablet, 1 tablet, Oral, Q M,W,F, Elease Etienne, MD;  zolpidem (AMBIEN) tablet 5 mg, 5 mg, Oral, QHS, Elease Etienne, MD Allergies  Allergen Reactions  . Penicillins     rash     Objective:     BP 107/68  Pulse 75  Temp(Src) 98 F (36.7 C) (Oral)  Resp 16  Ht 5\' 6"  (1.676 m)  Wt 108.364 kg (238 lb 14.4 oz)  BMI 38.58 kg/m2  SpO2 100% She is in no distress  Heart regular rhythm no murmurs  Lungs clear  Abdomen is soft and nontender  Laboratory No components found with this basename: d1      Assessment:     Intermittent rectal bleeding over the last few weeks.  I don't think the rectal bleeding as the cause of her underlying major symptoms.  She has never had a colonoscopy. I think it would be  reasonable to do one in the near future.  Need to make sure that her other medical problems are stable.      Plan:     We will follow.

## 2013-05-30 NOTE — H&P (Signed)
[facility] History and Physical  [name] WGN:[id number] DOB: May 15, 1966 DOA: 05/30/2013  Referring physician: EDP PCP: Dr. Maebelle Munroe [facility] Specialists:  Multiple at [facility] in [facility]  Chief Complaint: Rectal bleeding, dizziness, lightheadedness, falls and an episode of passing out.  HPI: [name] is a 47 y.o. female with history of Wegener's disease, on chronic prednisone for years and methotrexate, borderline DM, HTN, OSA being evaluated for CPAP, peripheral neuropathy, hysterectomy, cholecystectomy who presented to the [facility] ED on 05/30/2013 with complaints of worsening rectal bleeding, dizziness, lightheadedness and an episode of passing out. She states that approximately 4 weeks ago, she was started on Topamax for peripheral neuropathy after nerve biopsy (results pending). At the same time her prednisone dose was reduced from 10 mg to 5 mg daily. Approximately a week later, she started experiencing generalized weakness, dizziness and lightheadedness in the upright position, an episode of passing out times one at the outset. The symptoms have persisted and have even worsened. She feels unsteady in her gait and has fallen twice without LOC or injuries. She also stopped Topamax a week ago. At the same time, approximately 3 weeks ago, she started noticing streaks of blood on the toilet paper but no overt bleeding or melena. Over the last 1 week, she has noticed increase in bleeding with drops of blood in the toilet bowl which was the worst last night. She denies clots. She does complain of some rectal pain. She denies abdominal pain. She complains of nausea and vomiting associated with her dizziness and lightheadedness in upright position. No fever or chills. Appetite has slightly reduced. Due to these complaints, she presented to the ED where initial blood pressures were 84/53 mmHg, WBC 17.7 (chronically at 16), hemoglobin 11.8 (12.5 on 07/29/12), potassium 3.1 and CT abdomen  with contrast showing mild fatty infiltration of liver and left colonic diverticulosis without diverticulitis. Hospitalist admission requested.   Review of Systems: All systems reviewed and apart from history of presenting illness, are negative.  Past Medical History  Diagnosis Date  . Chronic cough   . Wegner's disease (congenital syphilitic osteochondritis)   . Hypertension   . Diabetes mellitus without complication   . OSA (obstructive sleep apnea)   . Peripheral neuropathy    Past Surgical History  Procedure Laterality Date  . Cholecystectomy    . Appendectomy    . Vaginal hysterectomy    . Splenic aneurysm     Social History:  reports that she has never smoked. She has never used smokeless tobacco. She reports that  drinks alcohol. She reports that she does not use illicit drugs. Married. Independent of activities of daily living. Occasionally drinks alcohol.  Allergies  Allergen Reactions  . Penicillins     rash    Family History  Problem Relation Age of Onset  . Diabetes Mother   . Heart disease Mother   . Diabetes Father     Prior to Admission medications   Medication Sig Start Date End Date Taking? Authorizing Provider  b complex vitamins tablet Take 1 tablet by mouth daily.   Yes Historical Provider, MD  Cyanocobalamin (VITAMIN B 12 PO) Take 1 tablet by mouth daily.   Yes Historical Provider, MD  DULoxetine (CYMBALTA) 60 MG capsule Take 60 mg by mouth 2 (two) times daily.   Yes Historical Provider, MD  esomeprazole (NEXIUM) 40 MG capsule Take 40 mg by mouth daily before breakfast.   Yes Historical Provider, MD  folic acid (FOLVITE) 1 MG  tablet Take 1 mg by mouth daily.   Yes Historical Provider, MD  hydrochlorothiazide (HYDRODIURIL) 25 MG tablet Take 25 mg by mouth daily.   Yes Historical Provider, MD  losartan (COZAAR) 100 MG tablet Take 100 mg by mouth daily.   Yes Historical Provider, MD  methotrexate (RHEUMATREX) 2.5 MG tablet Take 20 mg by mouth once a  week. Fridays. Caution:Chemotherapy. Protect from light.   Yes Historical Provider, MD  PARoxetine (PAXIL) 20 MG tablet Take 20 mg by mouth every morning.   Yes Historical Provider, MD  predniSONE (DELTASONE) 5 MG tablet Take 5 mg by mouth daily.   Yes Historical Provider, MD  sulfamethoxazole-trimethoprim (BACTRIM DS) 800-160 MG per tablet Take 1 tablet by mouth every Monday, Wednesday, and Friday.   Yes Historical Provider, MD  zolpidem (AMBIEN) 10 MG tablet Take 10 mg by mouth at bedtime.   Yes Historical Provider, MD   Physical Exam: Filed Vitals:   05/30/13 1429 05/30/13 1500 05/30/13 1515 05/30/13 1530  BP:  103/63  105/59  Pulse: 71 77 73   Temp:      TempSrc:      Resp: 17 19 16 12   Height:      Weight:      SpO2: 95% 98% 95%      General exam: Moderately built and obese female patient, lying comfortably supine on the gurney in no obvious distress.  Head, eyes and ENT: Nontraumatic and normocephalic. Pupils equally reacting to light and accommodation. Oral mucosa dry.  Neck: Supple. No JVD, carotid bruit or thyromegaly.  Lymphatics: No lymphadenopathy.  Respiratory system: Clear to auscultation. No increased work of breathing.  Cardiovascular system: S1 and S2 heard, RRR. No JVD, murmurs, gallops, clicks or pedal edema. Telemetry: Sinus rhythm.  Gastrointestinal system: Abdomen is nondistended, soft and nontender. Normal bowel sounds heard. No organomegaly or masses appreciated.  Central nervous system: Alert and oriented. No focal neurological deficits.  Extremities: Symmetric 5 x 5 power. Peripheral pulses symmetrically felt.  Skin: No rashes or acute findings.  Musculoskeletal system: Negative exam.  Psychiatry: Pleasant and cooperative.   Labs on Admission:  Basic Metabolic Panel:  Recent Labs Lab 05/30/13 1103  NA 135  K 3.1*  CL 95*  CO2 28  GLUCOSE 103*  BUN 11  CREATININE 1.14*  CALCIUM 9.4   Liver Function Tests:  Recent Labs Lab  05/30/13 1103  AST 20  ALT 21  ALKPHOS 128*  BILITOT 0.2*  PROT 7.1  ALBUMIN 3.5   No results found for this basename: LIPASE, AMYLASE,  in the last 168 hours No results found for this basename: AMMONIA,  in the last 168 hours CBC:  Recent Labs Lab 05/30/13 1037  WBC 17.7*  NEUTROABS 12.1*  HGB 11.8*  HCT 36.3  MCV 81.2  PLT 550*   Cardiac Enzymes: No results found for this basename: CKTOTAL, CKMB, CKMBINDEX, TROPONINI,  in the last 168 hours  BNP (last 3 results) No results found for this basename: PROBNP,  in the last 8760 hours CBG: No results found for this basename: GLUCAP,  in the last 168 hours  Radiological Exams on Admission: Dg Chest 2 View  05/30/2013   *RADIOLOGY REPORT*  Clinical Data: 47 year old female cough dizziness rectal bleeding chest pain shortness of breath lower extremity numbness.  History of Wegener's disease.  CHEST - 2 VIEW  Comparison: 07/20/2009 and earlier.  Findings: Stable low normal lung volumes.  Cardiac size and mediastinal contours are within normal limits.  Visualized  tracheal air column is within normal limits.  The lung parenchyma appears stable and clear.  No pneumothorax or effusion.  No confluent pulmonary opacity. No acute osseous abnormality identified. Bilateral upper quadrant surgical clips.  IMPRESSION: No acute cardiopulmonary abnormality.   Original Report Authenticated By: Erskine Speed, M.D.   Ct Abdomen Pelvis W Contrast  05/30/2013   *RADIOLOGY REPORT*  Clinical Data: Rectal pain, bright red blood per rectum.  CT ABDOMEN AND PELVIS WITH CONTRAST  Technique:  Multidetector CT imaging of the abdomen and pelvis was performed following the standard protocol during bolus administration of intravenous contrast.  Contrast: OMNIPAQUE IOHEXOL 300 MG/ML  SOLN  Comparison: None.  Findings: Lung bases are clear.  No effusions.  Heart is normal size.  Prior cholecystectomy.  Mild diffuse low density throughout the liver compatible with  fatty infiltration.  No focal abnormality. Surgical clips are noted in the splenic hilum.  There is an area of parenchymal loss/scarring in the spleen, presumably postoperative scarring.  Pancreas, adrenals and kidneys are normal.  Scattered descending colonic and sigmoid diverticulosis.  No active diverticulitis.  Small bowel is decompressed.  No free fluid, free air or adenopathy.  Uterus is surgically absent.  No adnexal masses.  Urinary bladder grossly unremarkable.  Appendix is not visualized, question prior appendectomy.  No inflammatory process.  No acute bony abnormality.  IMPRESSION: Mild diffuse fatty fixation of the liver.  Postoperative changes in the spleen and splenic hilum.  Prior cholecystectomy.  Prior hysterectomy.  Question prior appendectomy. Appendix not visualized.  Left colonic diverticulosis.  No active diverticulitis.   Original Report Authenticated By: Charlett Nose, M.D.    EKG: Independently reviewed. None seen on Epic-we'll order.  Assessment/Plan Principal Problem:   Bright red rectal bleeding Active Problems:   Wegner's disease (congenital syphilitic osteochondritis)   Adrenal insufficiency   Hypokalemia   Leukocytosis   Syncope   Falls   Chronic steroid use   Anemia   1. Rectal bleeding: Per EDP, rectal exam showed brown stools in no obvious blood but FOBT +. Patient does not see GI and has not had a colonoscopy done. DD: Diverticulosis, hemorrhoids, AVMs versus other etiology. Start on full liquids. [facility] consulted for further recommendations. 2. Possible adrenal insufficiency/hypotension: Secondary to acute illness, recent reduction in prednisone dosage. Patient bolused with IV fluids. Continue maintenance IV fluids for possible mild dehydration. Change temporarily to IV hydrocortisone. Monitor closely. Hold antihypertensive medications. 3. Hypokalemia: Replete and follow BMP in a.m. 4. Anemia: Likely chronic disease. Follow CBC closely and transfuse if  hemoglobin is less than 7 g/dL. 5. Leukocytosis: Chronic, likely secondary to steroids. Follow CBC in a.m. 6. Syncope: Possibly secondary to hypotension/orthostatic hypotension. Management per problem #2. Monitor clinically and if has persisting symptoms, may consider further evaluation. 7. Wegener's disease/chronic immunosuppression/prednisone and methotrexate: Changed to IV hydrocortisone. 8. Borderline DM: Place on sliding scale insulin. 9. Falls/dizziness: Likely secondary to problem #2. Management as above.    Code Status: Full Family Communication:  Discussed with spouse and daughter at bedside  Disposition Plan: Home when medically stable.   Time spent: 65 minutes  [facility] Pager [phone]  If 7PM-7AM, please contact night-coverage [email or web address] Password [facility] 05/30/2013, 4:54 PM

## 2013-05-30 NOTE — ED Notes (Signed)
Per pt - sts she was recently put on Topamax for nerve neuropathy, on july 17th. Pt sts soon after she started taking the medicine she felt achy in bones and bld in stool. Told her PCP and was told to stop taking the medicine, so she stopped about a week ago, but continued to have the bld in stool, body aches and dizziness. sts sometimes she feels like her heart is racing. Reports bld has mostly been on the toilet paper, bright red color, sometimes in the toilet bowl. Pt reports she has been having some diarrhea, not uncommon for her, watery but sometimes loose stools. Denies abd pain. Taking prednisone 5mg  daily. Pt in nad, resp e/u.

## 2013-05-30 NOTE — Progress Notes (Signed)
Pt arrived to unit via ED stretcher accompanied by NT and family. Pt is alert and oriented x 4, no cx pain at this time. Pt placed on tele box 6E22, CCMD notified. Pt denies large amounts of blood in stool, states usually only notes blood with wiping or on stool, describes as BRBPR. Pt endorses falling this AM at home, educated on fall risk and placed on bed alarm. Pt signed Fall Prevention safety plan, yellow armband and red socks applied. Will continue to monitor.

## 2013-05-30 NOTE — Progress Notes (Signed)
Spoke with [facility] in [place] where patient fills her medications. She does take both Paxil 20mg  daily and Duloxetine 60mg  daily. Spoke with Dr. Waymon Amato and he is aware.  Leasia Swann D. Everett Ehrler, PharmD Clinical Pharmacist Pager: [phone] 05/30/2013 6:12 PM

## 2013-05-31 LAB — CBC
Hemoglobin: 9.6 g/dL — ABNORMAL LOW (ref 12.0–15.0)
MCV: 82.3 fL (ref 78.0–100.0)
Platelets: 443 10*3/uL — ABNORMAL HIGH (ref 150–400)
RBC: 3.77 MIL/uL — ABNORMAL LOW (ref 3.87–5.11)
RBC: 3.5 MIL/uL — ABNORMAL LOW (ref 3.87–5.11)
RDW: 17.5 % — ABNORMAL HIGH (ref 11.5–15.5)
WBC: 12.3 10*3/uL — ABNORMAL HIGH (ref 4.0–10.5)
WBC: 14 10*3/uL — ABNORMAL HIGH (ref 4.0–10.5)

## 2013-05-31 LAB — GLUCOSE, CAPILLARY
Glucose-Capillary: 127 mg/dL — ABNORMAL HIGH (ref 70–99)
Glucose-Capillary: 122 mg/dL — ABNORMAL HIGH (ref 70–99)
Glucose-Capillary: 127 mg/dL — ABNORMAL HIGH (ref 70–99)
Glucose-Capillary: 116 mg/dL — ABNORMAL HIGH (ref 70–99)

## 2013-05-31 LAB — BASIC METABOLIC PANEL
Chloride: 108 mEq/L (ref 96–112)
GFR calc Af Amer: 76 mL/min — ABNORMAL LOW (ref >90)
GFR calc non Af Amer: 65 mL/min — ABNORMAL LOW (ref >90)
Potassium: 3.7 mEq/L (ref 3.5–5.1)

## 2013-05-31 LAB — TYPE AND SCREEN
ABO/RH(D): O POS
Antibody Screen: NEGATIVE

## 2013-05-31 LAB — ABO/RH: ABO/RH(D): O POS

## 2013-05-31 MED ORDER — OXYCODONE HCL 5 MG PO TABS
5.0000 mg | ORAL_TABLET | Freq: Once | ORAL | Status: AC
  Administered 2013-05-31: 5 mg via ORAL
  Filled 2013-05-31: qty 1

## 2013-05-31 MED ORDER — POTASSIUM CHLORIDE IN NACL 40-0.9 MEQ/L-% IV SOLN
INTRAVENOUS | Status: AC
  Administered 2013-05-31: 21:00:00 via INTRAVENOUS
  Filled 2013-05-31: qty 1000

## 2013-05-31 MED ORDER — DULOXETINE HCL 60 MG PO CPEP
60.0000 mg | ORAL_CAPSULE | Freq: Two times a day (BID) | ORAL | Status: DC
  Administered 2013-05-31 – 2013-06-03 (×7): 60 mg via ORAL
  Filled 2013-05-31 (×8): qty 1

## 2013-05-31 NOTE — Evaluation (Signed)
Physical Therapy Evaluation Patient Details Name: [name] MRN: [id number] DOB: Dec 04, 1965 Today's Date: 05/31/2013 Time: 2536-6440 PT Time Calculation (min): 34 min  PT Assessment / Plan / Recommendation History of Present Illness  [name] is a 47 y.o. 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 She states that approximately 4 weeks ago, she was started on Topamax for peripheral neuropathy after nerve biopsy (results pending). At the same time her prednisone dose was reduced from 10 mg to 5 mg daily. Approximately a week later, she started experiencing generalized weakness, dizziness and lightheadedness in the upright position, an episode of passing out times one at the outset. The symptoms have persisted and have even worsened. She feels unsteady in her gait and has fallen twice without LOC or injuries  Clinical Impression  Pt limited due to fatigue with SOB.   SOB currently at baseline due to Wegener's Disease.  Pt currently orthostatic in sitting.  RN aware.  Pt will benefit from acute PT services to improve overall mobility and prepare for safe d/c home.  Do not anticipate in post acute PT needs.     PT Assessment  Patient needs continued PT services    Follow Up Recommendations  No PT follow up;Supervision - Intermittent    Equipment Recommendations  Other (comment) (May benefit from shower chair for energy conservation)    Frequency Min 3X/week    Precautions / Restrictions Precautions Precautions: Fall Precaution Comments: orthostatic   Pertinent Vitals/Pain Orthostatic BPs  Supine 105/77  Sitting 81/44     Standing 104/70  At end of session in sitting 115/77          Mobility  Bed Mobility Bed Mobility: Supine to Sit Supine to Sit: 6: Modified independent (Device/Increase time) Details for Bed Mobility Assistance: Needs extra time Transfers Transfers: Sit to Stand;Stand to Sit Sit to Stand: 4: Min assist;4: Min guard;From bed;From toilet Stand to Sit: 4: Min assist;4: Min guard;To chair/3-in-1;To toilet Details for Transfer Assistance: Min (A) from lower surfaces with cues for hand placement Ambulation/Gait Ambulation/Gait Assistance: 4: Min assist;4: Min guard Ambulation Distance (Feet): 20 Feet Assistive device: 1 person hand held assist;None Ambulation/Gait Assistance Details: Occasional min (A) with HHA due to dizziness and fatigue.   Gait Pattern: Step-through pattern Gait velocity: decreased due dizziness Stairs: No     PT Diagnosis: Generalized weakness  PT Problem List: Decreased strength;Decreased activity tolerance;Decreased balance;Decreased mobility;Decreased knowledge of use of DME;Cardiopulmonary status limiting activity PT Treatment Interventions: DME instruction;Gait training;Stair training;Functional mobility training;Therapeutic activities;Therapeutic exercise;Balance training;Patient/family education     PT Goals(Current goals can be found in the care plan section) Acute Rehab PT Goals Patient Stated Goal: To figure out whats causing this bleeding PT Goal Formulation: With patient Time For Goal Achievement: 06/07/13 Potential to Achieve Goals: Good  Visit Information  Last PT Received On: 05/31/13 Assistance Needed: +1 History of Present Illness: [name] is a 47 y.o. female with history of Wegener's disease, on chronic prednisone for years and methotrexate, borderline DM, HTN, OSA being evaluated for CPAP, peripheral neuropathy, hysterectomy, cholecystectomy who presented to the [facility] ED on 05/30/2013 with complaints of worsening rectal bleeding, dizziness, lightheadedness and an episode of  passing out. She states that approximately 4 weeks ago, she  was started on Topamax for peripheral neuropathy after nerve biopsy (results pending). At the same time her prednisone dose was reduced from 10 mg to 5 mg daily. Approximately a week later, she started experiencing generalized weakness, dizziness and lightheadedness in the upright position, an episode of passing out times one at the outset. The symptoms have persisted and have even worsened. She feels unsteady in her gait and has fallen twice without LOC or injuries       Prior Functioning  Home Living Family/patient expects to be discharged to:: Private residence Living Arrangements: Spouse/significant other;Children Available Help at Discharge: Family Type of Home: House Home Access: Stairs to enter Secretary/administrator of Steps: 4 Entrance Stairs-Rails: None Home Layout: Two level;Able to live on main level with bedroom/bathroom Home Equipment: None Prior Function Level of Independence: Needs assistance Gait / Transfers Assistance Needed: supervision ~3-4 weeks due to dizziness ADL's / Homemaking Assistance Needed: Assistance getting in/out of shower for ~ 3-4 weeks Communication Communication: No difficulties Dominant Hand: Left    Cognition  Cognition Arousal/Alertness: Awake/alert Behavior During Therapy: WFL for tasks assessed/performed Overall Cognitive Status: Within Functional Limits for tasks assessed    Extremity/Trunk Assessment Lower Extremity Assessment Lower Extremity Assessment: Generalized weakness   Balance    End of Session PT - End of Session Equipment Utilized During Treatment: Gait belt Activity Tolerance: Patient limited by fatigue (limited due to dizziness) Patient left: in chair;with call bell/phone within reach Nurse Communication: Mobility status  GP     [name] 05/31/2013, 8:58 AM  Jake Shark, PT DPT [phone]

## 2013-05-31 NOTE — Progress Notes (Signed)
[facility] PROGRESS NOTE  [name] GNF:[id number] DOB: May 25, 1966 DOA: 05/30/2013 PCP: No PCP Per Patient  Brief narrative 47 y.o. female with history of Wegener's disease, on chronic prednisone for years and methotrexate, borderline DM, HTN, OSA being evaluated for CPAP, peripheral neuropathy, hysterectomy, cholecystectomy who presented to the [facility] ED on 05/30/2013 with complaints of worsening rectal bleeding, dizziness, lightheadedness and an episode of passing out. Her prednisone dose was recently reduced. In the ED she was orthostatic, WBC 17.7 (chronically at 16), hemoglobin 11.8 (12.5 on 07/29/12), potassium 3.1 and CT abdomen with contrast showing mild fatty infiltration of liver and left colonic diverticulosis without diverticulitis. Hospitalist admission requested.  Assessment/Plan: 1. Rectal bleeding: Per EDP, rectal exam showed brown stools & no obvious blood but FOBT +. Patient does not see GI and has not had a colonoscopy done. DD: Diverticulosis, hemorrhoids, AVMs versus other etiology. Start on full liquids. [facility] consultation and followup appreciated. Plans for colonoscopy 8/11. Mild blood on tissue wipe but did not look in bowl. 2. Possible adrenal insufficiency/orthostatic hypotension: Secondary to acute illness, recent reduction in prednisone dosage. Held antihypertensive medications. Patient was bolused with IV fluids and continued on maintenance IV fluids. She was placed on stress dose IV hydrocortisone. Orthostatic blood pressure changes have improved/resolved this morning. Reduce IV fluids and monitor closely. 3. Hypokalemia: Corrected 4. Anemia: Acute and chronic. Acute drop may be some from GI bleed and dilutional. Follow CBC every 12 hourly and transfuse if hemoglobin less than 7 g per DL. 5. Leukocytosis: Chronic, likely secondary to steroids. Better. 6. Syncope: Possibly secondary to hypotension/orthostatic hypotension. Management per problem #2. Still  has mild dizziness/lightheadedness-monitor with PT. 7. Wegener's disease/chronic immunosuppression/prednisone and methotrexate: Changed to IV hydrocortisone. 8. Borderline DM: Place on sliding scale insulin. 9. Falls/dizziness: Likely secondary to problem #2. Management as above.  Code Status: Full Family Communication: None Disposition Plan: Home in medically stable   Consultants:  [facility]  Procedures:  None  Antibiotics:  None   HPI/Subjective: Patient had 2 BMs overnight-smear of blood on toilet paper but did not look in the bowl. Still has mild dizziness and lightheadedness. States that she takes Cymbalta 60 mg by mouth twice a day chronically-verified by nurse.  Objective: Filed Vitals:   05/31/13 0800 05/31/13 1134 05/31/13 1135 05/31/13 1136  BP: 110/75 116/79 114/85 134/89  Pulse: 87 90 88 87  Temp: 98.2 F (36.8 C)     TempSrc: Oral     Resp: 16 16    Height:      Weight:      SpO2: 98%       Intake/Output Summary (Last 24 hours) at 05/31/13 1239 Last data filed at 05/31/13 0805  Gross per 24 hour  Intake 2483.75 ml  Output      0 ml  Net 2483.75 ml   Filed Weights   05/30/13 1053 05/30/13 1630 05/30/13 2126  Weight: 105.235 kg (232 lb) 108.364 kg (238 lb 14.4 oz) 108.364 kg (238 lb 14.4 oz)    Exam:   General exam: Comfortable. Sitting on chair.  Respiratory system: Clear. No increased work of breathing.  Cardiovascular system: S1 & S2 heard, RRR. No JVD, murmurs, gallops, clicks or pedal edema.  Gastrointestinal system: Abdomen is nondistended, soft and nontender. Normal bowel sounds heard.  Central nervous system: Alert and oriented. No focal neurological deficits.  Extremities: Symmetric 5 x 5 power.   Data Reviewed: Basic Metabolic Panel:  Recent Labs Lab 05/30/13 1103 05/31/13  0520  NA 135 141  K 3.1* 3.7  CL 95* 108  CO2 28 24  GLUCOSE 103* 118*  BUN 11 8  CREATININE 1.14* 1.01  CALCIUM 9.4 8.3*   Liver Function  Tests:  Recent Labs Lab 05/30/13 1103  AST 20  ALT 21  ALKPHOS 128*  BILITOT 0.2*  PROT 7.1  ALBUMIN 3.5   No results found for this basename: LIPASE, AMYLASE,  in the last 168 hours No results found for this basename: AMMONIA,  in the last 168 hours CBC:  Recent Labs Lab 05/30/13 1037 05/30/13 1747 05/31/13 0520  WBC 17.7* 14.2* 12.3*  NEUTROABS 12.1*  --   --   HGB 11.8* 10.3* 9.6*  HCT 36.3 31.9* 31.1*  MCV 81.2 81.4 82.5  PLT 550* 490* 473*   Cardiac Enzymes: No results found for this basename: CKTOTAL, CKMB, CKMBINDEX, TROPONINI,  in the last 168 hours BNP (last 3 results) No results found for this basename: PROBNP,  in the last 8760 hours CBG:  Recent Labs Lab 05/30/13 2130 05/31/13 0812 05/31/13 1205  GLUCAP 166* 127* 122*    No results found for this or any previous visit (from the past 240 hour(s)).   Studies: Dg Chest 2 View  05/30/2013   *RADIOLOGY REPORT*  Clinical Data: 47 year old female cough dizziness rectal bleeding chest pain shortness of breath lower extremity numbness.  History of Wegener's disease.  CHEST - 2 VIEW  Comparison: 07/20/2009 and earlier.  Findings: Stable low normal lung volumes.  Cardiac size and mediastinal contours are within normal limits.  Visualized tracheal air column is within normal limits.  The lung parenchyma appears stable and clear.  No pneumothorax or effusion.  No confluent pulmonary opacity. No acute osseous abnormality identified. Bilateral upper quadrant surgical clips.  IMPRESSION: No acute cardiopulmonary abnormality.   Original Report Authenticated By: Erskine Speed, M.D.   Ct Abdomen Pelvis W Contrast  05/30/2013   *RADIOLOGY REPORT*  Clinical Data: Rectal pain, bright red blood per rectum.  CT ABDOMEN AND PELVIS WITH CONTRAST  Technique:  Multidetector CT imaging of the abdomen and pelvis was performed following the standard protocol during bolus administration of intravenous contrast.  Contrast: OMNIPAQUE  IOHEXOL 300 MG/ML  SOLN  Comparison: None.  Findings: Lung bases are clear.  No effusions.  Heart is normal size.  Prior cholecystectomy.  Mild diffuse low density throughout the liver compatible with fatty infiltration.  No focal abnormality. Surgical clips are noted in the splenic hilum.  There is an area of parenchymal loss/scarring in the spleen, presumably postoperative scarring.  Pancreas, adrenals and kidneys are normal.  Scattered descending colonic and sigmoid diverticulosis.  No active diverticulitis.  Small bowel is decompressed.  No free fluid, free air or adenopathy.  Uterus is surgically absent.  No adnexal masses.  Urinary bladder grossly unremarkable.  Appendix is not visualized, question prior appendectomy.  No inflammatory process.  No acute bony abnormality.  IMPRESSION: Mild diffuse fatty fixation of the liver.  Postoperative changes in the spleen and splenic hilum.  Prior cholecystectomy.  Prior hysterectomy.  Question prior appendectomy. Appendix not visualized.  Left colonic diverticulosis.  No active diverticulitis.   Original Report Authenticated By: Charlett Nose, M.D.     Additional labs:   Scheduled Meds: . DULoxetine  60 mg Oral BID  . folic acid  1 mg Oral Daily  . hydrocortisone sod succinate (SOLU-CORTEF) inj  50 mg Intravenous Q8H  . insulin aspart  0-9 Units Subcutaneous TID  WC  . pantoprazole  40 mg Oral Daily  . PARoxetine  20 mg Oral q morning - 10a  . sodium chloride  3 mL Intravenous Q12H  . [START ON 06/02/2013] sulfamethoxazole-trimethoprim  1 tablet Oral Q M,W,F  . zolpidem  5 mg Oral QHS   Continuous Infusions: . 0.9 % NaCl with KCl 40 mEq / L 125 mL/hr (05/31/13 0829)    Principal Problem:   Bright red rectal bleeding Active Problems:   Wegner's disease (congenital syphilitic osteochondritis)   Adrenal insufficiency   Hypokalemia   Leukocytosis   Syncope   Falls   Chronic steroid use   Anemia    Time spent: 35  minutes    [facility]  [facility] Pager [phone].   If 8PM-8AM, please contact night-coverage at [email or web address], password [facility] 05/31/2013, 12:39 PM  LOS: 1 day

## 2013-05-31 NOTE — Progress Notes (Signed)
Feeling better. Discussed colonoscopy and we will plan to do on Monday

## 2013-06-01 ENCOUNTER — Inpatient Hospital Stay (HOSPITAL_COMMUNITY): Payer: BC Managed Care – PPO

## 2013-06-01 DIAGNOSIS — R51 Headache: Secondary | ICD-10-CM

## 2013-06-01 LAB — CBC
Hemoglobin: 9.1 g/dL — ABNORMAL LOW (ref 12.0–15.0)
MCH: 26.4 pg (ref 26.0–34.0)
MCHC: 31.9 g/dL (ref 30.0–36.0)
Platelets: 440 10*3/uL — ABNORMAL HIGH (ref 150–400)
RDW: 17.7 % — ABNORMAL HIGH (ref 11.5–15.5)

## 2013-06-01 LAB — GLUCOSE, CAPILLARY
Glucose-Capillary: 94 mg/dL (ref 70–99)
Glucose-Capillary: 113 mg/dL — ABNORMAL HIGH (ref 70–99)

## 2013-06-01 MED ORDER — PEG 3350-KCL-NA BICARB-NACL 420 G PO SOLR
4000.0000 mL | Freq: Once | ORAL | Status: AC
  Administered 2013-06-01: 4000 mL via ORAL
  Filled 2013-06-01: qty 4000

## 2013-06-01 MED ORDER — OXYCODONE HCL 5 MG PO TABS
10.0000 mg | ORAL_TABLET | Freq: Once | ORAL | Status: AC
  Administered 2013-06-01: 10 mg via ORAL
  Filled 2013-06-01: qty 2

## 2013-06-01 MED ORDER — HYDROCORTISONE SOD SUCCINATE 100 MG IJ SOLR
50.0000 mg | Freq: Two times a day (BID) | INTRAMUSCULAR | Status: DC
  Administered 2013-06-01 – 2013-06-03 (×4): 50 mg via INTRAVENOUS
  Filled 2013-06-01 (×6): qty 1

## 2013-06-01 NOTE — Progress Notes (Signed)
No complaints today. Will plan colonoscopy tomorrow.

## 2013-06-01 NOTE — Progress Notes (Signed)
[facility] PROGRESS NOTE  [name] AVW:[id number] DOB: 07/18/1966 DOA: 05/30/2013 PCP: No PCP Per Patient  Brief narrative 47 y.o. female with history of Wegener's disease, on chronic prednisone for years and methotrexate, borderline DM, HTN, OSA being evaluated for CPAP, peripheral neuropathy, hysterectomy, cholecystectomy who presented to the [facility] ED on 05/30/2013 with complaints of worsening rectal bleeding, dizziness, lightheadedness and an episode of passing out. Her prednisone dose was recently reduced. In the ED she was orthostatic, WBC 17.7 (chronically at 16), hemoglobin 11.8 (12.5 on 07/29/12), potassium 3.1 and CT abdomen with contrast showing mild fatty infiltration of liver and left colonic diverticulosis without diverticulitis. Hospitalist admission requested.  Assessment/Plan: 1. Rectal bleeding: Per EDP, rectal exam showed brown stools & no obvious blood but FOBT +. Patient does not see GI and has not had a colonoscopy done. DD: Diverticulosis, hemorrhoids, AVMs versus other etiology. Started on full liquids. [facility] consultation and followup appreciated. Plans for colonoscopy 8/11. Decreasing. Tiny smear on toilet paper yesterday.  2. Possible adrenal insufficiency/orthostatic hypotension: Secondary to acute illness, recent reduction in prednisone dosage. Held antihypertensive medications. Patient was bolused with IV fluids and continued on maintenance IV fluids. She was placed on stress dose IV hydrocortisone. Orthostatic blood pressure changes have improved/resolved this morning. DC IVF and patient will have to DC on higher that home dose of Prednisone. 3. Headache: states frequent headaches-x3 per week since left parietal bone graft of for nose surgery in May 2014. Symptoms not any worse. Given history of syncope x1-3 weeks ago and headaches, obtain CT head-no acute findings. 4. Hypokalemia: Corrected 5. Anemia: Acute and chronic. Acute drop may be some from GI  bleed and dilutional. Transfuse if hemoglobin less than 7 g per DL. Hemoglobin dropped to 9.1 g but has stabilized over the last 24 hours. Follow CBC in a.m. 6. Leukocytosis: Chronic, likely secondary to steroids. Better. 7. Syncope: Possibly secondary to hypotension/orthostatic hypotension. Management per problem #2. Dizziness and lightheadedness are improving. CT head negative. 8. Wegener's disease/chronic immunosuppression/prednisone and methotrexate: Changed to IV hydrocortisone. 9. Borderline DM: Place on sliding scale insulin. 10. Falls/dizziness: Likely secondary to problem #2. Management as above.  Code Status: Full Family Communication: None Disposition Plan: Possible discharge on 8/11 pending colonoscopy   Consultants:  [facility]  Procedures:  None  Antibiotics:  None   HPI/Subjective: Mild streak of blood on toilet paper yesterday but none since then. Dizziness and lightheadedness have improved by at least 25%. Complains of frequent headaches x3 per week since May. No headache this morning.  Objective: Filed Vitals:   05/31/13 1752 05/31/13 2100 06/01/13 0456 06/01/13 0900  BP: 128/61 106/61 117/81 116/62  Pulse: 76 74 75 80  Temp: 98.5 F (36.9 C) 98.3 F (36.8 C) 97.7 F (36.5 C) 98.4 F (36.9 C)  TempSrc: Oral Oral Oral Oral  Resp: 20 20 20 20   Height:      Weight:  105.5 kg (232 lb 9.4 oz)    SpO2: 98% 98% 100% 100%    Intake/Output Summary (Last 24 hours) at 06/01/13 1148 Last data filed at 06/01/13 0735  Gross per 24 hour  Intake   3950 ml  Output      2 ml  Net   3948 ml   Filed Weights   05/30/13 1630 05/30/13 2126 05/31/13 2100  Weight: 108.364 kg (238 lb 14.4 oz) 108.364 kg (238 lb 14.4 oz) 105.5 kg (232 lb 9.4 oz)    Exam:   General exam:  Comfortable. Sitting on chair.  Respiratory system: Clear. No increased work of breathing.  Cardiovascular system: S1 & S2 heard, RRR. No JVD, murmurs, gallops, clicks or pedal edema. Telemetry:  Sinus rhythm (will DC telemetry 8/10)  Gastrointestinal system: Abdomen is nondistended, soft and nontender. Normal bowel sounds heard.  Central nervous system: Alert and oriented. No focal neurological deficits.  Extremities: Symmetric 5 x 5 power.   Data Reviewed: Basic Metabolic Panel:  Recent Labs Lab 05/30/13 1103 05/31/13 0520  NA 135 141  K 3.1* 3.7  CL 95* 108  CO2 28 24  GLUCOSE 103* 118*  BUN 11 8  CREATININE 1.14* 1.01  CALCIUM 9.4 8.3*   Liver Function Tests:  Recent Labs Lab 05/30/13 1103  AST 20  ALT 21  ALKPHOS 128*  BILITOT 0.2*  PROT 7.1  ALBUMIN 3.5   No results found for this basename: LIPASE, AMYLASE,  in the last 168 hours No results found for this basename: AMMONIA,  in the last 168 hours CBC:  Recent Labs Lab 05/30/13 1037 05/30/13 1747 05/31/13 0520 05/31/13 1641 06/01/13 0452  WBC 17.7* 14.2* 12.3* 14.0* 11.0*  NEUTROABS 12.1*  --   --   --   --   HGB 11.8* 10.3* 9.6* 9.1* 9.1*  HCT 36.3 31.9* 31.1* 28.8* 28.5*  MCV 81.2 81.4 82.5 82.3 82.6  PLT 550* 490* 473* 443* 440*   Cardiac Enzymes: No results found for this basename: CKTOTAL, CKMB, CKMBINDEX, TROPONINI,  in the last 168 hours BNP (last 3 results) No results found for this basename: PROBNP,  in the last 8760 hours CBG:  Recent Labs Lab 05/31/13 0812 05/31/13 1205 05/31/13 1638 05/31/13 2232 06/01/13 0737  GLUCAP 127* 122* 127* 116* 121*    No results found for this or any previous visit (from the past 240 hour(s)).   Studies: Ct Head Wo Contrast  06/01/2013   *RADIOLOGY REPORT*  Clinical Data: Headaches  CT HEAD WITHOUT CONTRAST  Technique:  Contiguous axial images were obtained from the base of the skull through the vertex without contrast.  Comparison: None  Findings: Ventricle size is normal.  Negative for acute infarct, hemorrhage, or mass lesion.  No edema or midline shift.  Prior nasal bone surgery with reconstruction.  Perforated nasal septum.  Mild  mucosal edema in the paranasal sinuses.  Surgical clips in the left parietal soft tissues.  There has  been resection of a portion of the outer table of the  left parietal bone.  IMPRESSION: No acute intracranial abnormality.  Postsurgical changes.   Original Report Authenticated By: Janeece Riggers, M.D.   Ct Abdomen Pelvis W Contrast  05/30/2013   *RADIOLOGY REPORT*  Clinical Data: Rectal pain, bright red blood per rectum.  CT ABDOMEN AND PELVIS WITH CONTRAST  Technique:  Multidetector CT imaging of the abdomen and pelvis was performed following the standard protocol during bolus administration of intravenous contrast.  Contrast: OMNIPAQUE IOHEXOL 300 MG/ML  SOLN  Comparison: None.  Findings: Lung bases are clear.  No effusions.  Heart is normal size.  Prior cholecystectomy.  Mild diffuse low density throughout the liver compatible with fatty infiltration.  No focal abnormality. Surgical clips are noted in the splenic hilum.  There is an area of parenchymal loss/scarring in the spleen, presumably postoperative scarring.  Pancreas, adrenals and kidneys are normal.  Scattered descending colonic and sigmoid diverticulosis.  No active diverticulitis.  Small bowel is decompressed.  No free fluid, free air or adenopathy.  Uterus is surgically  absent.  No adnexal masses.  Urinary bladder grossly unremarkable.  Appendix is not visualized, question prior appendectomy.  No inflammatory process.  No acute bony abnormality.  IMPRESSION: Mild diffuse fatty fixation of the liver.  Postoperative changes in the spleen and splenic hilum.  Prior cholecystectomy.  Prior hysterectomy.  Question prior appendectomy. Appendix not visualized.  Left colonic diverticulosis.  No active diverticulitis.   Original Report Authenticated By: Charlett Nose, M.D.     Additional labs:   Scheduled Meds: . DULoxetine  60 mg Oral BID  . folic acid  1 mg Oral Daily  . hydrocortisone sod succinate (SOLU-CORTEF) inj  50 mg Intravenous Q8H  .  insulin aspart  0-9 Units Subcutaneous TID WC  . pantoprazole  40 mg Oral Daily  . PARoxetine  20 mg Oral q morning - 10a  . polyethylene glycol-electrolytes  4,000 mL Oral Once  . sodium chloride  3 mL Intravenous Q12H  . [START ON 06/02/2013] sulfamethoxazole-trimethoprim  1 tablet Oral Q M,W,F  . zolpidem  5 mg Oral QHS   Continuous Infusions:    Principal Problem:   Bright red rectal bleeding Active Problems:   Wegner's disease (congenital syphilitic osteochondritis)   Adrenal insufficiency   Hypokalemia   Leukocytosis   Syncope   Falls   Chronic steroid use   Anemia    Time spent: 25 minutes    [employer]  [facility] Pager [phone].   If 8PM-8AM, please contact night-coverage at [email or web address], password [facility] 06/01/2013, 11:48 AM  LOS: 2 days

## 2013-06-02 ENCOUNTER — Encounter (HOSPITAL_COMMUNITY): Payer: Self-pay

## 2013-06-02 ENCOUNTER — Encounter (HOSPITAL_COMMUNITY)
Admission: EM | Disposition: A | Discharge status: Home / Self Care | Payer: Self-pay | Source: Home / Self Care | Attending: Internal Medicine

## 2013-06-02 DIAGNOSIS — D72829 Elevated white blood cell count, unspecified: Secondary | ICD-10-CM

## 2013-06-02 HISTORY — PX: COLONOSCOPY: SHX5424

## 2013-06-02 LAB — CBC
MCH: 25.9 pg — ABNORMAL LOW (ref 26.0–34.0)
MCH: 26.4 pg (ref 26.0–34.0)
MCHC: 32.1 g/dL (ref 30.0–36.0)
MCV: 82.9 fL (ref 78.0–100.0)
MCV: 82.2 fL (ref 78.0–100.0)
Platelets: 409 10*3/uL — ABNORMAL HIGH (ref 150–400)
Platelets: 475 10*3/uL — ABNORMAL HIGH (ref 150–400)
RDW: 17.7 % — ABNORMAL HIGH (ref 11.5–15.5)
RDW: 17.7 % — ABNORMAL HIGH (ref 11.5–15.5)
WBC: 12.5 10*3/uL — ABNORMAL HIGH (ref 4.0–10.5)
WBC: 15.4 10*3/uL — ABNORMAL HIGH (ref 4.0–10.5)

## 2013-06-02 LAB — GLUCOSE, CAPILLARY: Glucose-Capillary: 105 mg/dL — ABNORMAL HIGH (ref 70–99)

## 2013-06-02 SURGERY — COLONOSCOPY
Anesthesia: Moderate Sedation

## 2013-06-02 MED ORDER — FENTANYL CITRATE 0.05 MG/ML IJ SOLN
INTRAMUSCULAR | Status: DC | PRN
  Administered 2013-06-02 (×4): 25 ug via INTRAVENOUS

## 2013-06-02 MED ORDER — MIDAZOLAM HCL 5 MG/ML IJ SOLN
INTRAMUSCULAR | Status: AC
  Filled 2013-06-02: qty 2

## 2013-06-02 MED ORDER — DIPHENHYDRAMINE HCL 50 MG/ML IJ SOLN
INTRAMUSCULAR | Status: DC | PRN
  Administered 2013-06-02: 25 mg via INTRAVENOUS

## 2013-06-02 MED ORDER — MIDAZOLAM HCL 5 MG/5ML IJ SOLN
INTRAMUSCULAR | Status: DC | PRN
  Administered 2013-06-02 (×2): 1 mg via INTRAVENOUS
  Administered 2013-06-02 (×3): 2 mg via INTRAVENOUS

## 2013-06-02 MED ORDER — HYDROCORTISONE ACETATE 25 MG RE SUPP
25.0000 mg | Freq: Two times a day (BID) | RECTAL | Status: DC
  Administered 2013-06-02 – 2013-06-03 (×2): 25 mg via RECTAL
  Filled 2013-06-02 (×4): qty 1

## 2013-06-02 MED ORDER — SODIUM CHLORIDE 0.9 % IV SOLN: INTRAVENOUS | Status: DC

## 2013-06-02 MED ORDER — DIPHENHYDRAMINE HCL 50 MG/ML IJ SOLN
INTRAMUSCULAR | Status: AC
  Filled 2013-06-02: qty 1

## 2013-06-02 MED ORDER — FENTANYL CITRATE 0.05 MG/ML IJ SOLN
INTRAMUSCULAR | Status: AC
  Filled 2013-06-02: qty 4

## 2013-06-02 NOTE — Op Note (Signed)
[facility] [address] [place], [address]   COLONOSCOPY PROCEDURE REPORT  PATIENT: Stacey, [name]  MR#: [id number] BIRTHDATE: 1965/12/29 , 47  yrs. old GENDER: Female ENDOSCOPIST: Wandalee Ferdinand, MD REFERRED BY: PROCEDURE DATE:  06/02/2013 PROCEDURE:   colonoscopy ASA CLASS:   3 INDICATIONS:intermittent rectal bleeding MEDICATIONS: fentanyl 100 mcg IV, Versed 8 mg IV, Benadryl 25 mg IV  DESCRIPTION OF PROCEDURE:   After the risks benefits and alternatives of the procedure were thoroughly explained, informed consent was obtained.  digital rectal exam was performed and no masses were felt, on inspection of the anal rectal area there was a very small pea sized prolapsing internal hemorrhoid which was easily reduced.        The Pentax Ped Colon [id number]  endoscope was introduced through the anus and advanced to the cecum     . No adverse events experienced.   The quality of the prep was good The instrument was then slowly withdrawn as the colon was fully examined.    The cecum and ascending colon were normal. The transverse colon was normal. The descending colon sigmoid and rectum were normal. On retroflexion small internal hemorrhoids were seen. There was no evidence of active bleeding.    The time to cecum=  .  Withdrawal time=  .  The scope was withdrawn and the procedure completed. COMPLICATIONS: There were no complications.  ENDOSCOPIC IMPRESSION:internal hemorrhoids otherwise normal colonoscopy to the cecum. No evidence of active bleeding.  RECOMMENDATIONS:I would recommend Anusol HC suppositories for treatment of her hemorrhoids. If bleeding were to continue she should see a surgeon to consider banding of the internal hemorrhoids. There is nothing else on this exam to explain bleeding.   eSigned:  Wandalee Ferdinand, MD 06/02/2013 2:07 PM   cc:

## 2013-06-02 NOTE — Plan of Care (Signed)
Problem: Acute Rehab PT Goals Goal: Pt Will Go Up/Down Stairs Outcome: Not Met (add Reason) Pt preferred to d/c from acute PT, did not attempt stairs, pt reports she will have assist home

## 2013-06-02 NOTE — Progress Notes (Signed)
[facility] PROGRESS NOTE  [name] WUJ:[id number] DOB: [birth year] DOA: 05/30/2013 PCP: No PCP Per Patient  Brief narrative 47 y.o. 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 Her prednisone dose was recently reduced. In the ED she was orthostatic, WBC 17.7 (chronically at 16), hemoglobin 11.8 (12.5 on 07/29/12), potassium 3.1 and CT abdomen with contrast showing mild fatty infiltration of liver and left colonic diverticulosis without diverticulitis. Hospitalist admission requested.  Assessment/Plan: 1. Rectal bleeding: Per EDP, rectal exam showed brown stools & no obvious blood but FOBT +. Patient does not see GI and has not had a colonoscopy done. DD: Diverticulosis, hemorrhoids, AVMs versus other etiology. Started on full liquids. [facility] consultation and followup appreciated. Colonoscopy showed internal hemorrhoids otherwise normal. Anusol HC suppositories. 2. Possible adrenal insufficiency/orthostatic hypotension: Secondary to acute illness, recent reduction in prednisone dosage. Held antihypertensive medications. Patient was bolused with IV fluids and continued on maintenance IV fluids. She was placed on stress dose IV hydrocortisone. Orthostatic blood pressure changes have improved/resolved this morning. DC IVF and patient will have to DC on higher that home dose of Prednisone. Symptoms continued to improve. 3. Headache: states frequent headaches-x3 per week since left parietal bone graft of for nose surgery in May 2014. Symptoms not any worse. Given history of syncope x1-3 weeks ago and headaches, obtain CT head-no acute findings. 4. Hypokalemia: Corrected 5. Anemia: Acute and  chronic. Acute drop may be some from GI bleed and dilutional. Transfuse if hemoglobin less than 7 g per DL. Hemoglobin dropped to 9.1 g but has stabilized over the last 24 hours. Hemoglobin had dropped to 8.4 this morning-may be a lab error because repeat hemoglobin is 10.5. Follow up CBC in a.m. 6. Leukocytosis: Chronic, likely secondary to steroids. Better. 7. Syncope: Possibly secondary to hypotension/orthostatic hypotension. Management per problem #2. Dizziness and lightheadedness are improving. CT head negative. 8. Wegener's disease/chronic immunosuppression/prednisone and methotrexate: Changed to IV hydrocortisone. 9. Borderline DM: Place on sliding scale insulin. 10. Falls/dizziness: Likely secondary to problem #2. Management as above.  Code Status: Full Family Communication: None Disposition Plan: Possible discharge on 8/12   Consultants:  [facility]  Procedures:  None  Antibiotics:  None   HPI/Subjective: Patient was seen in before colonoscopy. She stated that she had some blood in stools during bowel prep last night  Objective: Filed Vitals:   06/02/13 1415 06/02/13 1425 06/02/13 1430 06/02/13 1758  BP: 135/72 139/78  134/74  Pulse: 77 80 78 72  Temp:    98.6 F (37 C)  TempSrc:    Oral  Resp: 15 15 14 16   Height:      Weight:      SpO2: 97% 92% 94% 98%    Intake/Output Summary (Last 24 hours) at 06/02/13 1812 Last data filed at 06/02/13 0700  Gross per 24 hour  Intake    240 ml  Output      0 ml  Net    240 ml   Filed Weights   05/30/13 2126 05/31/13 2100 06/01/13 2029  Weight: 108.364 kg (238 lb 14.4 oz) 105.5 kg (232 lb 9.4 oz) 111.54 kg (245 lb 14.4 oz)    Exam:   General exam: Comfortable. Sitting on chair.  Respiratory system: Clear. No increased  work of breathing.  Cardiovascular system: S1 & S2 heard, RRR. No JVD, murmurs, gallops, clicks or pedal edema. Telemetry: Sinus rhythm (will DC telemetry 8/10)  Gastrointestinal system: Abdomen is  nondistended, soft and nontender. Normal bowel sounds heard.  Central nervous system: Alert and oriented. No focal neurological deficits.  Extremities: Symmetric 5 x 5 power.   Data Reviewed: Basic Metabolic Panel:  Recent Labs Lab 05/30/13 1103 05/31/13 0520  NA 135 141  K 3.1* 3.7  CL 95* 108  CO2 28 24  GLUCOSE 103* 118*  BUN 11 8  CREATININE 1.14* 1.01  CALCIUM 9.4 8.3*   Liver Function Tests:  Recent Labs Lab 05/30/13 1103  AST 20  ALT 21  ALKPHOS 128*  BILITOT 0.2*  PROT 7.1  ALBUMIN 3.5   No results found for this basename: LIPASE, AMYLASE,  in the last 168 hours No results found for this basename: AMMONIA,  in the last 168 hours CBC:  Recent Labs Lab 05/30/13 1037  05/31/13 0520 05/31/13 1641 06/01/13 0452 06/02/13 0525 06/02/13 1530  WBC 17.7*  < > 12.3* 14.0* 11.0* 12.5* 15.4*  NEUTROABS 12.1*  --   --   --   --   --   --   HGB 11.8*  < > 9.6* 9.1* 9.1* 8.3* 10.5*  HCT 36.3  < > 31.1* 28.8* 28.5* 26.6* 32.7*  MCV 81.2  < > 82.5 82.3 82.6 82.9 82.2  PLT 550*  < > 473* 443* 440* 409* 475*  < > = values in this interval not displayed. Cardiac Enzymes: No results found for this basename: CKTOTAL, CKMB, CKMBINDEX, TROPONINI,  in the last 168 hours BNP (last 3 results) No results found for this basename: PROBNP,  in the last 8760 hours CBG:  Recent Labs Lab 06/01/13 1606 06/01/13 2206 06/02/13 0757 06/02/13 1256 06/02/13 1756  GLUCAP 113* 86 84 91 102*    No results found for this or any previous visit (from the past 240 hour(s)).   Studies: Ct Head Wo Contrast  06/01/2013   *RADIOLOGY REPORT*  Clinical Data: Headaches  CT HEAD WITHOUT CONTRAST  Technique:  Contiguous axial images were obtained from the base of the skull through the vertex without contrast.  Comparison: None  Findings: Ventricle size is normal.  Negative for acute infarct, hemorrhage, or mass lesion.  No edema or midline shift.  Prior nasal bone surgery with  reconstruction.  Perforated nasal septum.  Mild mucosal edema in the paranasal sinuses.  Surgical clips in the left parietal soft tissues.  There has  been resection of a portion of the outer table of the  left parietal bone.  IMPRESSION: No acute intracranial abnormality.  Postsurgical changes.   Original Report Authenticated By: Janeece Riggers, M.D.     Additional labs:   Scheduled Meds: . DULoxetine  60 mg Oral BID  . folic acid  1 mg Oral Daily  . hydrocortisone  25 mg Rectal BID  . hydrocortisone sod succinate (SOLU-CORTEF) inj  50 mg Intravenous Q12H  . insulin aspart  0-9 Units Subcutaneous TID WC  . pantoprazole  40 mg Oral Daily  . PARoxetine  20 mg Oral q morning - 10a  . sodium chloride  3 mL Intravenous Q12H  . sulfamethoxazole-trimethoprim  1 tablet Oral Q M,W,F  . zolpidem  5 mg Oral QHS   Continuous Infusions:    Principal Problem:   Bright red rectal bleeding Active Problems:   Wegner's disease (congenital syphilitic osteochondritis)   Adrenal  insufficiency   Hypokalemia   Leukocytosis   Syncope   Falls   Chronic steroid use   Anemia   Headache(784.0)    Time spent: 25 minutes    [facility]  [facility] Pager [phone].   If 8PM-8AM, please contact night-coverage at [email or web address], password [facility] 06/02/2013, 6:12 PM  LOS: 3 days

## 2013-06-02 NOTE — Progress Notes (Signed)
Physical Therapy Treatment and Discharge from acute PT Patient Details Name: [name] MRN: [id number] DOB: [birth year] Today's Date: 06/02/2013 Time: 4098-1191 PT Time Calculation (min): 9 min  PT Assessment / Plan / Recommendation  History of Present Illness [name] is a 47 y.o. female with history of Wegener's disease, on chronic prednisone for years and methotrexate, borderline DM, HTN, OSA being evaluated for CPAP, peripheral neuropathy, hysterectomy, cholecystectomy who presented to the [facility] ED on 05/30/2013 with complaints of worsening rectal bleeding, dizziness, lightheadedness and an episode of passing out. She states that approximately 4 weeks ago, she was started on Topamax for peripheral neuropathy after nerve biopsy (results pending). At the same time her prednisone dose was reduced from 10 mg to 5 mg daily. Approximately a week later, she started experiencing generalized weakness, dizziness and lightheadedness in the upright position, an episode of passing out times one at the outset. The symptoms have persisted and have even worsened. She feels unsteady in her gait and has fallen twice without LOC or injuries   PT Comments   Pt ambulated in hallway with supervision due to slight lightheadedness however pt reports she feels she no longer needs acute PT at this time.  Pt states she will have assist at home however would be alone during the day.  Pt states overall dizziness much improved since admission and happy to not be orthostatic today.  Pt agreeable to ambulate with staff/family during hospitalization.  Pt reports colonoscopy today and hopeful for d/c home tomorrow pending "blood work."   Follow Up Recommendations  No PT follow up;Supervision - Intermittent     Does the patient have the potential to tolerate intense rehabilitation     Barriers to Discharge        Equipment Recommendations  Other (comment) (may benefit from shower chair)    Recommendations  for Other Services    Frequency     Progress towards PT Goals Progress towards PT goals: Goals met/education completed, patient discharged from PT  Plan Other (comment) (d/c per pt wishes)    Precautions / Restrictions Precautions Precautions: Fall Restrictions Weight Bearing Restrictions: No   Pertinent Vitals/Pain Orthostatics negative - placed in docflowsheets    Mobility  Bed Mobility Bed Mobility: Supine to Sit;Sit to Supine Supine to Sit: 7: Independent Sit to Supine: 7: Independent Transfers Transfers: Sit to Stand;Stand to Sit Sit to Stand: 6: Modified independent (Device/Increase time);From bed Stand to Sit: 6: Modified independent (Device/Increase time);To bed Ambulation/Gait Ambulation/Gait Assistance: 5: Supervision Ambulation Distance (Feet): 100 Feet Assistive device: None Ambulation/Gait Assistance Details: pt with occasional grazing of hand rail, reports slight lightheadedness however did not become worse and pt states much better since admission Gait Pattern: Step-through pattern Gait velocity: decreased due dizziness    Exercises     PT Diagnosis:    PT Problem List:   PT Treatment Interventions:     PT Goals (current goals can now be found in the care plan section)    Visit Information  Last PT Received On: 06/02/13 Assistance Needed: +1 History of Present Illness: [name] is a 47 y.o. female with history of Wegener's disease, on chronic prednisone for years and methotrexate, borderline DM, HTN, OSA being evaluated for CPAP, peripheral neuropathy, hysterectomy, cholecystectomy who presented to the [facility] ED on 05/30/2013 with complaints of worsening rectal bleeding, dizziness, lightheadedness and an episode of passing out. She states that approximately 4 weeks ago, she was started on Topamax for peripheral neuropathy after  nerve biopsy (results pending). At the same time her prednisone dose was reduced from 10 mg to 5 mg daily. Approximately a  week later, she started experiencing generalized weakness, dizziness and lightheadedness in the upright position, an episode of passing out times one at the outset. The symptoms have persisted and have even worsened. She feels unsteady in her gait and has fallen twice without LOC or injuries    Subjective Data      Cognition  Cognition Arousal/Alertness: Awake/alert Behavior During Therapy: WFL for tasks assessed/performed Overall Cognitive Status: Within Functional Limits for tasks assessed    Balance     End of Session PT - End of Session Activity Tolerance: Patient tolerated treatment well Patient left: in bed;with call bell/phone within reach;with bed alarm set   GP     [name],KATHrine E 06/02/2013, 11:00 AM Zenovia Jarred, PT, DPT 06/02/2013 Pager: [phone]

## 2013-06-03 ENCOUNTER — Encounter (HOSPITAL_COMMUNITY): Payer: Self-pay | Admitting: Gastroenterology

## 2013-06-03 DIAGNOSIS — R55 Syncope and collapse: Secondary | ICD-10-CM

## 2013-06-03 LAB — CBC
Hemoglobin: 9.2 g/dL — ABNORMAL LOW (ref 12.0–15.0)
Platelets: 455 10*3/uL — ABNORMAL HIGH (ref 150–400)
RBC: 3.52 MIL/uL — ABNORMAL LOW (ref 3.87–5.11)
WBC: 14.8 10*3/uL — ABNORMAL HIGH (ref 4.0–10.5)

## 2013-06-03 LAB — GLUCOSE, CAPILLARY
Glucose-Capillary: 87 mg/dL (ref 70–99)
Glucose-Capillary: 122 mg/dL — ABNORMAL HIGH (ref 70–99)

## 2013-06-03 MED ORDER — PREDNISONE 5 MG PO TABS: 10.0000 mg | ORAL_TABLET | Freq: Every day | ORAL | Status: DC

## 2013-06-03 MED ORDER — HYDROCORTISONE ACETATE 25 MG RE SUPP: 25.0000 mg | Freq: Two times a day (BID) | RECTAL | Status: DC

## 2013-06-03 NOTE — Progress Notes (Signed)
Discharged to home with husband. All belongings sent with patient. IV d/c'd. D/C'd via wheelchair. Discharge instructions gone over with patient and husband. No questions at this time.

## 2013-06-03 NOTE — Discharge Summary (Signed)
Physician Discharge Summary  Shirly Bartosiewicz JWJ:[id number] DOB: 17-Nov-1965 DOA: 05/30/2013  PCP: Dr. Maebelle Munroe Hix Specialists: in [facility], [place].  Admit date: 05/30/2013 Discharge date: 06/03/2013  Time spent: Greater than 30 minutes  Recommendations for Outpatient Follow-up:  1. Dr. Maebelle Munroe Hix, PCP in 1 week with repeat labs (CBC & BMP) 2. Specialists at [facility], [facility]   Discharge Diagnoses:  Principal Problem:   Bright red rectal bleeding Active Problems:   Wegner's disease (congenital syphilitic osteochondritis)   Adrenal insufficiency   Hypokalemia   Leukocytosis   Syncope   Falls   Chronic steroid use   Anemia   Headache(784.0)   Discharge Condition: Improved & Stable  Diet recommendation: Heart Healthy & diabetic diet.  Filed Weights   05/31/13 2100 06/01/13 2029 06/02/13 2028  Weight: 105.5 kg (232 lb 9.4 oz) 111.54 kg (245 lb 14.4 oz) 111.54 kg (245 lb 14.4 oz)    History of present illness:  47 y.o. female with history of Wegener's disease, on chronic prednisone for years and methotrexate, borderline DM, HTN, OSA being evaluated for CPAP, peripheral neuropathy, hysterectomy, cholecystectomy who presented to the [facility] ED on 05/30/2013 with complaints of worsening rectal bleeding, dizziness, lightheadedness and an episode of passing out. Her prednisone dose was recently reduced. In the ED she was orthostatic, WBC 17.7 (chronically at 16), hemoglobin 11.8 (12.5 on 07/29/12), potassium 3.1 and CT abdomen with contrast showing mild fatty infiltration of liver and left colonic diverticulosis without diverticulitis. Hospitalist admission requested.  Hospital Course:  1. Rectal bleeding: Per EDP, rectal exam showed brown stools & no obvious blood but FOBT +. Patient does not see GI and has not had a colonoscopy recently. DD were: Diverticulosis, hemorrhoids, AVMs versus other etiology. Started on full liquids. Gastroenterology was consulted.  Colonoscopy showed internal hemorrhoids otherwise normal. Anusol HC suppositories recommended. No further rectal bleed since yesterday. 2. Possible adrenal insufficiency/orthostatic hypotension: Secondary to acute illness, recent reduction in prednisone dosage. Held antihypertensive medications. Patient was treated with IV fluids. She was placed on stress dose IV hydrocortisone. Orthostatic blood pressure changes have resolved. Significantly improved. She is able to ambulate in the room without significant dizziness or lightheadedness. No sensations of passing out. Her prednisone dose will be increased from 5 > 10 mg daily. She has been counseled extensively regarding gradual transition from lying >sitting >standing >ambulating. Continue to hold Cozaar until follow up with PCP in 1 week. 3. Headache: states frequent headaches-x3 per week since left parietal bone graft of for nose surgery in May 2014. Symptoms not any worse. Given history of syncope x1-3 weeks ago and headaches, obtained CT head-no acute findings.  4. Hypokalemia: Corrected  5. Anemia: Acute on chronic. Acute drop may be some from GI bleed and dilutional. Hemoglobin stable at 9.2 g/dL.  6. Leukocytosis: Chronic, likely secondary to steroids.   7. Syncope: Possibly secondary to hypotension/orthostatic hypotension. Management per problem #2. Dizziness and lightheadedness significantly better. CT head negative.  8. Wegener's disease/chronic immunosuppression/prednisone and methotrexate: Increased prednisone to 10 mg daily and continue methotrexate. Outpatient followup with PCP and MDs at [facility], [place] 9. Borderline DM-diet controlled: Place on sliding scale insulin. Good inpatient control. 10. Falls/dizziness: Likely secondary to problem #2. Management as above. 11. Peripheral neuropathy: Outpatient followup 12. HTN: HCTZ and Cozaar had been held since admission secondary to hypotension. We'll resume HCTZ on discharge but continue to  hold Cozaar since blood pressures are mostly normal or on the softer side. This  can be resumed during OP follow up.  Procedures:  Colonoscopy 06/02/13  DESCRIPTION OF PROCEDURE: After the risks benefits and alternatives of the procedure were thoroughly explained, informed consent was obtained. digital rectal exam was performed and no masses were felt, on inspection of the anal rectal area there was a very small pea sized prolapsing internal hemorrhoid which was easily reduced. The Pentax Ped Colon [id number] endoscope was introduced through the anus and advanced to the cecum . No adverse events experienced. The quality of the prep was good The instrument was then slowly withdrawn as the colon was fully examined.   The cecum and ascending colon were normal. The transverse colon was normal. The descending colon sigmoid and rectum were normal. On retroflexion small internal hemorrhoids were seen. There was no evidence of active bleeding.  The scope was withdrawn and the procedure completed.   COMPLICATIONS: There were no complications.   ENDOSCOPIC IMPRESSION:internal hemorrhoids otherwise normal colonoscopy to the cecum. No evidence of active bleeding.   RECOMMENDATIONS: recommend Anusol HC suppositories for treatment of her hemorrhoids. If bleeding were to continue she should see a surgeon to consider banding of the internal hemorrhoids. There is nothing else on this exam to explain  bleeding.    Consultations:  GI  Discharge Exam:  Complaints: No further rectal bleeding since yesterday. Dizziness and lightheadedness have significantly improved. Able to ambulate in the room/to the bathroom without discomfort.   Filed Vitals:   06/02/13 1430 06/02/13 1758 06/02/13 2028 06/03/13 0553  BP:  134/74 107/61 123/76  Pulse: 78 72 84 77  Temp:  98.6 F (37 C) 98.5 F (36.9 C) 97.9 F (36.6 C)  TempSrc:  Oral Oral Oral  Resp: 14 16 16 16   Height:   5\' 6"  (1.676 m)   Weight:   111.54 kg (245  lb 14.4 oz)   SpO2: 94% 98% 100% 97%     General exam: Comfortable.  Respiratory system: Clear. No increased work of breathing.  Cardiovascular system: S1 and S2 heard, RRR. No JVD, murmurs or pedal edema.  Gastrointestinal system: Abdomen is nondistended, soft and nontender. Normal bowel sounds heard.  Central nervous system: Alert and oriented. No focal neurological deficits.  Extremities: Symmetric 5 x 5 power.  Discharge Instructions      Discharge Orders   Future Orders Complete By Expires     Call MD for:  extreme fatigue  As directed     Call MD for:  persistant dizziness or light-headedness  As directed     Call MD for:  severe uncontrolled pain  As directed     Call MD for:  As directed     Comments:      Rectal Bleeding.    Diet - low sodium heart healthy  As directed     Increase activity slowly  As directed         Medication List    STOP taking these medications       losartan 100 MG tablet  Commonly known as:  COZAAR      TAKE these medications       b complex vitamins tablet  Take 1 tablet by mouth daily.     DULoxetine 60 MG capsule  Commonly known as:  CYMBALTA  Take 60 mg by mouth 2 (two) times daily.     esomeprazole 40 MG capsule  Commonly known as:  NEXIUM  Take 40 mg by mouth daily before breakfast.     folic acid 1  MG tablet  Commonly known as:  FOLVITE  Take 1 mg by mouth daily.     hydrochlorothiazide 25 MG tablet  Commonly known as:  HYDRODIURIL  Take 25 mg by mouth daily.     hydrocortisone 25 MG suppository  Commonly known as:  ANUSOL-HC  Place 1 suppository (25 mg total) rectally 2 (two) times daily.     methotrexate 2.5 MG tablet  Commonly known as:  RHEUMATREX  Take 20 mg by mouth once a week. Fridays. Caution:Chemotherapy. Protect from light.     PARoxetine 20 MG tablet  Commonly known as:  PAXIL  Take 20 mg by mouth every morning.     predniSONE 5 MG tablet  Commonly known as:  DELTASONE  Take 2 tablets (10  mg total) by mouth daily.     sulfamethoxazole-trimethoprim 800-160 MG per tablet  Commonly known as:  BACTRIM DS  Take 1 tablet by mouth every Monday, Wednesday, and Friday.     VITAMIN B 12 PO  Take 1 tablet by mouth daily.     zolpidem 10 MG tablet  Commonly known as:  AMBIEN  Take 10 mg by mouth at bedtime.       Follow-up Information   Follow up with Dr. Maebelle Munroe Hix, PCP. Schedule an appointment as soon as possible for a visit in 1 week. (To be seen with repeat labs (CBC & BMP). Decision to resume Cozaar to be made at that visit.)       Schedule an appointment as soon as possible for a visit with MD's at [facility], [place].       The results of significant diagnostics from this hospitalization (including imaging, microbiology, ancillary and laboratory) are listed below for reference.    Significant Diagnostic Studies: Dg Chest 2 View  05/30/2013   *RADIOLOGY REPORT*  Clinical Data: 47 year old female cough dizziness rectal bleeding chest pain shortness of breath lower extremity numbness.  History of Wegener's disease.  CHEST - 2 VIEW  Comparison: 07/20/2009 and earlier.  Findings: Stable low normal lung volumes.  Cardiac size and mediastinal contours are within normal limits.  Visualized tracheal air column is within normal limits.  The lung parenchyma appears stable and clear.  No pneumothorax or effusion.  No confluent pulmonary opacity. No acute osseous abnormality identified. Bilateral upper quadrant surgical clips.  IMPRESSION: No acute cardiopulmonary abnormality.   Original Report Authenticated By: Erskine Speed, M.D.   Ct Head Wo Contrast  06/01/2013   *RADIOLOGY REPORT*  Clinical Data: Headaches  CT HEAD WITHOUT CONTRAST  Technique:  Contiguous axial images were obtained from the base of the skull through the vertex without contrast.  Comparison: None  Findings: Ventricle size is normal.  Negative for acute infarct, hemorrhage, or mass lesion.  No edema or midline  shift.  Prior nasal bone surgery with reconstruction.  Perforated nasal septum.  Mild mucosal edema in the paranasal sinuses.  Surgical clips in the left parietal soft tissues.  There has  been resection of a portion of the outer table of the  left parietal bone.  IMPRESSION: No acute intracranial abnormality.  Postsurgical changes.   Original Report Authenticated By: Janeece Riggers, M.D.   Ct Abdomen Pelvis W Contrast  05/30/2013   *RADIOLOGY REPORT*  Clinical Data: Rectal pain, bright red blood per rectum.  CT ABDOMEN AND PELVIS WITH CONTRAST  Technique:  Multidetector CT imaging of the abdomen and pelvis was performed following the standard protocol during bolus administration of intravenous contrast.  Contrast: OMNIPAQUE IOHEXOL 300 MG/ML  SOLN  Comparison: None.  Findings: Lung bases are clear.  No effusions.  Heart is normal size.  Prior cholecystectomy.  Mild diffuse low density throughout the liver compatible with fatty infiltration.  No focal abnormality. Surgical clips are noted in the splenic hilum.  There is an area of parenchymal loss/scarring in the spleen, presumably postoperative scarring.  Pancreas, adrenals and kidneys are normal.  Scattered descending colonic and sigmoid diverticulosis.  No active diverticulitis.  Small bowel is decompressed.  No free fluid, free air or adenopathy.  Uterus is surgically absent.  No adnexal masses.  Urinary bladder grossly unremarkable.  Appendix is not visualized, question prior appendectomy.  No inflammatory process.  No acute bony abnormality.  IMPRESSION: Mild diffuse fatty fixation of the liver.  Postoperative changes in the spleen and splenic hilum.  Prior cholecystectomy.  Prior hysterectomy.  Question prior appendectomy. Appendix not visualized.  Left colonic diverticulosis.  No active diverticulitis.   Original Report Authenticated By: Charlett Nose, M.D.    Microbiology: No results found for this or any previous visit (from the past 240 hour(s)).    Labs: Basic Metabolic Panel:  Recent Labs Lab 05/30/13 1103 05/31/13 0520  NA 135 141  K 3.1* 3.7  CL 95* 108  CO2 28 24  GLUCOSE 103* 118*  BUN 11 8  CREATININE 1.14* 1.01  CALCIUM 9.4 8.3*   Liver Function Tests:  Recent Labs Lab 05/30/13 1103  AST 20  ALT 21  ALKPHOS 128*  BILITOT 0.2*  PROT 7.1  ALBUMIN 3.5   No results found for this basename: LIPASE, AMYLASE,  in the last 168 hours No results found for this basename: AMMONIA,  in the last 168 hours CBC:  Recent Labs Lab 05/30/13 1037  05/31/13 1641 06/01/13 0452 06/02/13 0525 06/02/13 1530 06/03/13 0540  WBC 17.7*  < > 14.0* 11.0* 12.5* 15.4* 14.8*  NEUTROABS 12.1*  --   --   --   --   --   --   HGB 11.8*  < > 9.1* 9.1* 8.3* 10.5* 9.2*  HCT 36.3  < > 28.8* 28.5* 26.6* 32.7* 28.6*  MCV 81.2  < > 82.3 82.6 82.9 82.2 81.3  PLT 550*  < > 443* 440* 409* 475* 455*  < > = values in this interval not displayed. Cardiac Enzymes: No results found for this basename: CKTOTAL, CKMB, CKMBINDEX, TROPONINI,  in the last 168 hours BNP: BNP (last 3 results) No results found for this basename: PROBNP,  in the last 8760 hours CBG:  Recent Labs Lab 06/02/13 1256 06/02/13 1756 06/02/13 2032 06/03/13 0736 06/03/13 1141  GLUCAP 91 102* 105* 87 122*    Additional labs:  Positive urine pregnancy test was charted in error in patient's chart  FOBT +   Signed:  Yulieth Carrender  [facility] 06/03/2013, 1:44 PM

## 2013-06-10 NOTE — ED Provider Notes (Signed)
Medical screening examination/treatment/procedure(s) were performed by non-physician practitioner and as supervising physician I was immediately available for consultation/collaboration.\  Gilda Crease, MD 06/10/13 [phone]

## 2013-09-16 DIAGNOSIS — K602 Anal fissure, unspecified: Secondary | ICD-10-CM | POA: Insufficient documentation

## 2014-06-03 DIAGNOSIS — I959 Hypotension, unspecified: Secondary | ICD-10-CM | POA: Diagnosis present

## 2014-06-08 DIAGNOSIS — I498 Other specified cardiac arrhythmias: Secondary | ICD-10-CM | POA: Insufficient documentation

## 2014-06-08 DIAGNOSIS — D649 Anemia, unspecified: Secondary | ICD-10-CM | POA: Insufficient documentation

## 2014-06-08 DIAGNOSIS — I951 Orthostatic hypotension: Secondary | ICD-10-CM

## 2014-06-08 DIAGNOSIS — R000 Tachycardia, unspecified: Secondary | ICD-10-CM | POA: Insufficient documentation

## 2014-06-08 DIAGNOSIS — G90A Postural orthostatic tachycardia syndrome (POTS): Secondary | ICD-10-CM | POA: Insufficient documentation

## 2014-07-06 DIAGNOSIS — R55 Syncope and collapse: Secondary | ICD-10-CM | POA: Insufficient documentation

## 2014-07-20 DIAGNOSIS — E785 Hyperlipidemia, unspecified: Secondary | ICD-10-CM | POA: Insufficient documentation

## 2014-07-20 DIAGNOSIS — G589 Mononeuropathy, unspecified: Secondary | ICD-10-CM | POA: Insufficient documentation

## 2014-07-20 DIAGNOSIS — E669 Obesity, unspecified: Secondary | ICD-10-CM | POA: Insufficient documentation

## 2014-07-20 DIAGNOSIS — A5009 Other early congenital syphilis, symptomatic: Secondary | ICD-10-CM | POA: Insufficient documentation

## 2014-07-20 DIAGNOSIS — I10 Essential (primary) hypertension: Secondary | ICD-10-CM | POA: Insufficient documentation

## 2015-03-29 DIAGNOSIS — F4321 Adjustment disorder with depressed mood: Secondary | ICD-10-CM | POA: Insufficient documentation

## 2015-08-04 DIAGNOSIS — T85192A Other mechanical complication of implanted electronic neurostimulator (electrode) of spinal cord, initial encounter: Secondary | ICD-10-CM | POA: Insufficient documentation

## 2015-12-29 DIAGNOSIS — M313 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: Secondary | ICD-10-CM | POA: Insufficient documentation

## 2015-12-29 DIAGNOSIS — E661 Drug-induced obesity: Secondary | ICD-10-CM | POA: Insufficient documentation

## 2015-12-29 DIAGNOSIS — G63 Polyneuropathy in diseases classified elsewhere: Secondary | ICD-10-CM | POA: Insufficient documentation

## 2015-12-29 DIAGNOSIS — IMO0001 Reserved for inherently not codable concepts without codable children: Secondary | ICD-10-CM | POA: Insufficient documentation

## 2015-12-29 DIAGNOSIS — E785 Hyperlipidemia, unspecified: Secondary | ICD-10-CM | POA: Insufficient documentation

## 2015-12-29 DIAGNOSIS — G4733 Obstructive sleep apnea (adult) (pediatric): Secondary | ICD-10-CM | POA: Diagnosis present

## 2015-12-29 DIAGNOSIS — Z6835 Body mass index (BMI) 35.0-35.9, adult: Secondary | ICD-10-CM

## 2015-12-29 DIAGNOSIS — E782 Mixed hyperlipidemia: Secondary | ICD-10-CM | POA: Insufficient documentation

## 2015-12-29 DIAGNOSIS — F5104 Psychophysiologic insomnia: Secondary | ICD-10-CM | POA: Insufficient documentation

## 2016-01-11 DIAGNOSIS — G5793 Unspecified mononeuropathy of bilateral lower limbs: Secondary | ICD-10-CM | POA: Insufficient documentation

## 2016-01-16 DIAGNOSIS — G620 Drug-induced polyneuropathy: Secondary | ICD-10-CM | POA: Insufficient documentation

## 2016-01-16 DIAGNOSIS — T451X5A Adverse effect of antineoplastic and immunosuppressive drugs, initial encounter: Secondary | ICD-10-CM | POA: Insufficient documentation

## 2016-06-07 ENCOUNTER — Emergency Department (HOSPITAL_COMMUNITY): Payer: BC Managed Care – PPO

## 2016-06-07 ENCOUNTER — Emergency Department (HOSPITAL_COMMUNITY)
Admission: EM | Admit: 2016-06-07 | Discharge: 2016-06-08 | Disposition: A | Discharge status: Home / Self Care | Payer: BC Managed Care – PPO | Attending: Emergency Medicine | Admitting: Emergency Medicine

## 2016-06-07 ENCOUNTER — Encounter (HOSPITAL_COMMUNITY): Payer: Self-pay | Admitting: Emergency Medicine

## 2016-06-07 DIAGNOSIS — Z7952 Long term (current) use of systemic steroids: Secondary | ICD-10-CM | POA: Insufficient documentation

## 2016-06-07 DIAGNOSIS — G971 Other reaction to spinal and lumbar puncture: Secondary | ICD-10-CM | POA: Insufficient documentation

## 2016-06-07 DIAGNOSIS — I10 Essential (primary) hypertension: Secondary | ICD-10-CM | POA: Diagnosis not present

## 2016-06-07 DIAGNOSIS — Z79899 Other long term (current) drug therapy: Secondary | ICD-10-CM | POA: Diagnosis not present

## 2016-06-07 DIAGNOSIS — E1165 Type 2 diabetes mellitus with hyperglycemia: Secondary | ICD-10-CM | POA: Diagnosis not present

## 2016-06-07 DIAGNOSIS — R51 Headache: Secondary | ICD-10-CM | POA: Diagnosis present

## 2016-06-07 LAB — BASIC METABOLIC PANEL
Anion gap: 12 (ref 5–15)
BUN: 11 mg/dL (ref 6–20)
CALCIUM: 9.3 mg/dL (ref 8.9–10.3)
CO2: 23 mmol/L (ref 22–32)
CREATININE: 0.9 mg/dL (ref 0.44–1.00)
Chloride: 103 mmol/L (ref 101–111)
GFR calc Af Amer: >60 mL/min (ref >60)
GLUCOSE: 139 mg/dL — AB (ref 65–99)
Potassium: 3.7 mmol/L (ref 3.5–5.1)
SODIUM: 138 mmol/L (ref 135–145)

## 2016-06-07 LAB — CBC WITH DIFFERENTIAL/PLATELET
BASOS ABS: 0 10*3/uL (ref 0.0–0.1)
Basophils Relative: 0 %
EOS ABS: 0.4 10*3/uL (ref 0.0–0.7)
Eosinophils Relative: 3 %
HEMATOCRIT: 44.9 % (ref 36.0–46.0)
Hemoglobin: 14.8 g/dL (ref 12.0–15.0)
LYMPHS ABS: 6.3 10*3/uL — AB (ref 0.7–4.0)
Lymphocytes Relative: 45 %
MCH: 30.7 pg (ref 26.0–34.0)
MCHC: 33 g/dL (ref 30.0–36.0)
MCV: 93.2 fL (ref 78.0–100.0)
MONO ABS: 0.7 10*3/uL (ref 0.1–1.0)
Monocytes Relative: 5 %
NEUTROS ABS: 6.7 10*3/uL (ref 1.7–7.7)
Neutrophils Relative %: 47 %
PLATELETS: 450 10*3/uL — AB (ref 150–400)
RBC: 4.82 MIL/uL (ref 3.87–5.11)
RDW: 15.5 % (ref 11.5–15.5)
WBC: 14.1 10*3/uL — AB (ref 4.0–10.5)

## 2016-06-07 MED ORDER — SODIUM CHLORIDE 0.9 % IV BOLUS (SEPSIS)
1000.0000 mL | Freq: Once | INTRAVENOUS | Status: AC
  Administered 2016-06-07: 1000 mL via INTRAVENOUS

## 2016-06-07 MED ORDER — HYDROMORPHONE HCL 2 MG/ML IJ SOLN
2.0000 mg | Freq: Once | INTRAMUSCULAR | Status: AC
  Administered 2016-06-07: 2 mg via INTRAVENOUS
  Filled 2016-06-07: qty 1

## 2016-06-07 NOTE — ED Provider Notes (Signed)
[facility]-EMERGENCY DEPT Provider Note   CSN: [id number] Arrival date & time: 06/07/16  1843     History   Chief Complaint Chief Complaint  Patient presents with  . Migraine  . Back Pain    HPI [name] is a 50 y.o. female. Patient complains of severe bitemporal headache gradual onset August 4 after nerve stimulator was removed from her back for peripheral neuropathy. She also complains of pain between her shoulder blades for the past 2 days which is worse with changing positions. She denies fever. She does admit to active cough green sputum for the past 2 or 3 days. No shortness breath. No fever. No other associated symptoms. Admits to one episode of vomiting 2 days ago. No neck pain. Treated with oxycodone, without adequate pain relief. Determined to have a spinal headache at [facility]. Scheduled for blood patch in 5 days headache is worse with sitting up improved with lying supine. Severe in nature. No other associated symptoms no nausea present  HPI  Past Medical History:  Diagnosis Date  . Chronic cough   . Diabetes mellitus without complication (HCC)   . Hypertension   . OSA (obstructive sleep apnea)   . Peripheral neuropathy (HCC)   . Wegener's granulomatosis (HCC) 2009  . Wegner's disease (congenital syphilitic osteochondritis)     Patient Active Problem List   Diagnosis Date Noted  . Headache(784.0) 06/01/2013  . Adrenal insufficiency (HCC) 05/30/2013  . Hypokalemia 05/30/2013  . Leukocytosis 05/30/2013  . Syncope 05/30/2013  . Falls 05/30/2013  . Chronic steroid use 05/30/2013  . Anemia 05/30/2013  . Bright red rectal bleeding 05/30/2013  . Wegner's disease (congenital syphilitic osteochondritis)     Past Surgical History:  Procedure Laterality Date  . APPENDECTOMY    . BACK SURGERY    . CHOLECYSTECTOMY    . COLONOSCOPY N/A 06/02/2013   Procedure: COLONOSCOPY;  Surgeon: Graylin Shiver, MD;  Location: [facility] ENDOSCOPY;  Service: Endoscopy;  Laterality: N/A;  . HERNIA  REPAIR  2000  . SEPTOPLASTY  02/2013  . splenic aneurysm    . VAGINAL HYSTERECTOMY      OB History    No data available       Home Medications    Prior to Admission medications   Medication Sig Start Date End Date Taking? Authorizing Provider  b complex vitamins tablet Take 1 tablet by mouth daily.    Historical Provider, MD  Cyanocobalamin (VITAMIN B 12 PO) Take 1 tablet by mouth daily.    Historical Provider, MD  DULoxetine (CYMBALTA) 60 MG capsule Take 60 mg by mouth 2 (two) times daily.    Historical Provider, MD  esomeprazole (NEXIUM) 40 MG capsule Take 40 mg by mouth daily before breakfast.    Historical Provider, MD  folic acid (FOLVITE) 1 MG tablet Take 1 mg by mouth daily.    Historical Provider, MD  hydrochlorothiazide (HYDRODIURIL) 25 MG tablet Take 25 mg by mouth daily.    Historical Provider, MD  hydrocortisone (ANUSOL-HC) 25 MG suppository Place 1 suppository (25 mg total) rectally 2 (two) times daily. 06/03/13   Elease Etienne, MD  methotrexate (RHEUMATREX) 2.5 MG tablet Take 20 mg by mouth once a week. Fridays. Caution:Chemotherapy. Protect from light.    Historical Provider, MD  PARoxetine (PAXIL) 20 MG tablet Take 20 mg by mouth every morning.    Historical Provider, MD  predniSONE (DELTASONE) 5 MG tablet Take 2 tablets (10 mg total) by mouth daily. 06/03/13   Elease Etienne,  MD  sulfamethoxazole-trimethoprim (BACTRIM DS) 800-160 MG per tablet Take 1 tablet by mouth every Monday, Wednesday, and Friday.    Historical Provider, MD  zolpidem (AMBIEN) 10 MG tablet Take 10 mg by mouth at bedtime.    Historical Provider, MD    Family History Family History  Problem Relation Age of Onset  . Diabetes Mother   . Heart disease Mother   . Diabetes Father     Social History Social History  Substance Use Topics  . Smoking status: Never Smoker  . Smokeless tobacco: Never Used  . Alcohol use Yes     Comment: occasionally     Allergies   Penicillins   Review of  Systems Review of Systems  Constitutional: Negative.   Respiratory: Positive for cough.   Cardiovascular: Negative.   Gastrointestinal: Positive for vomiting.       Vomited 1. No nauseaat present  Musculoskeletal: Positive for back pain.       Pain between shoulder blades  Skin: Positive for wound.       Recent surgical wounds  Allergic/Immunologic: Positive for immunocompromised state.  Neurological: Positive for headaches.  Psychiatric/Behavioral: Negative.      Physical Exam Updated Vital Signs BP (!) 143/126 (BP Location: Right Arm)   Pulse 95   Temp 98.8 F (37.1 C) (Oral)   Resp 20   Ht 5' 6.5" (1.689 m)   Wt 215 lb (97.5 kg)   SpO2 98%   BMI 34.18 kg/m   Physical Exam  Constitutional: She is oriented to person, place, and time. She appears well-developed and well-nourished. She appears distressed.  Appears uncomfortable Glasgow Coma Score 15  HENT:  Head: Normocephalic and atraumatic.  Right Ear: External ear normal.  Left Ear: External ear normal.  Mouth/Throat: Oropharynx is clear and moist.  Eyes: Conjunctivae are normal. Pupils are equal, round, and reactive to light.  Neck: Neck supple. No tracheal deviation present. No thyromegaly present.  Cardiovascular: Normal rate and regular rhythm.   No murmur heard. Pulmonary/Chest: Effort normal and breath sounds normal.  Abdominal: Soft. Bowel sounds are normal. She exhibits no distension. There is no tenderness.  Musculoskeletal: Normal range of motion. She exhibits no edema or tenderness.  Entire spine nontender. She has pain at parathoracic area when she sits up from a supine position  Neurological: She is alert and oriented to person, place, and time. She has normal reflexes. No cranial nerve deficit. Coordination normal.  Glasgow Coma Score 15  Skin: Skin is warm and dry. Capillary refill takes less than 2 seconds. No rash noted.  All healing surgical wound over left lower quadrant left flank and paralumbar  area  Psychiatric: She has a normal mood and affect.  Nursing note and vitals reviewed.    ED Treatments / Results  Labs (all labs ordered are listed, but only abnormal results are displayed) Labs Reviewed - No data to display  EKG  EKG Interpretation None      Results for orders placed or performed during the hospital encounter of 06/07/16  Basic metabolic panel  Result Value Ref Range   Sodium 138 135 - 145 mmol/L   Potassium 3.7 3.5 - 5.1 mmol/L   Chloride 103 101 - 111 mmol/L   CO2 23 22 - 32 mmol/L   Glucose, Bld 139 (H) 65 - 99 mg/dL   BUN 11 6 - 20 mg/dL   Creatinine, Ser 1.610.90 0.44 - 1.00 mg/dL   Calcium 9.3 8.9 - 09.610.3 mg/dL   GFR  calc non Af Amer >60 >60 mL/min   GFR calc Af Amer >60 >60 mL/min   Anion gap 12 5 - 15  CBC with Differential/Platelet  Result Value Ref Range   WBC 14.1 (H) 4.0 - 10.5 K/uL   RBC 4.82 3.87 - 5.11 MIL/uL   Hemoglobin 14.8 12.0 - 15.0 g/dL   HCT 09.844.9 11.936.0 - 14.746.0 %   MCV 93.2 78.0 - 100.0 fL   MCH 30.7 26.0 - 34.0 pg   MCHC 33.0 30.0 - 36.0 g/dL   RDW 82.915.5 56.211.5 - 13.015.5 %   Platelets 450 (H) 150 - 400 K/uL   Neutrophils Relative % 47 %   Lymphocytes Relative 45 %   Monocytes Relative 5 %   Eosinophils Relative 3 %   Basophils Relative 0 %   Neutro Abs 6.7 1.7 - 7.7 K/uL   Lymphs Abs 6.3 (H) 0.7 - 4.0 K/uL   Monocytes Absolute 0.7 0.1 - 1.0 K/uL   Eosinophils Absolute 0.4 0.0 - 0.7 K/uL   Basophils Absolute 0.0 0.0 - 0.1 K/uL   WBC Morphology ABSOLUTE LYMPHOCYTOSIS    Dg Chest 2 View  Result Date: 06/07/2016 CLINICAL DATA:  Coughing up green sputum. EXAM: CHEST  2 VIEW COMPARISON:  05/30/2013. FINDINGS: Stable asymmetric elevation right hemidiaphragm. The lungs are clear wiithout focal pneumonia, edema, pneumothorax or pleural effusion. The cardiopericardial silhouette is within normal limits for size. The visualized bony structures of the thorax are intact. IMPRESSION: No active cardiopulmonary disease. Electronically Signed   By:  Kennith CenterEric  Mansell M.D.   On: 06/07/2016 21:45    Radiology No results found.  Procedures Procedures (including critical care time)  Medications Ordered in ED Medications  sodium chloride 0.9 % bolus 1,000 mL (not administered)  HYDROmorphone (DILAUDID) injection 2 mg (not administered)   12:20 AM pain not appreciably improved after treatment with intravenous fluids and intravenous hydromorphone. She does however feel ready to go home. I've discussed her case at length [facility]. She is alert and able to amulet without difficulty. Patient has a known spinal fluid leak. Dr.Qaori's office will call her in the morning to arrange to be seen on 06/09/2016.  Initial Impression / Assessment and Plan / ED Course  I have reviewed the triage vital signs and the nursing notes.There is no signs of encephalopathy. Strongly doubt pneumonia. Clear chest x-ray. No fever. Clear lung sounds. Normal respiratory rate and normal pulse oximetry   Pertinent labs & imaging results that were available during my care of the patient were reviewed by me and considered in my medical decision making (see chart for details).  Clinical Course     . Final Clinical Impressions(s) / ED Diagnoses  Diagnosis #1 headache #2 cough #3 hyperglycemia #4 elevated blood pressure Final diagnoses:  None    New Prescriptions New Prescriptions   No medications on file     Doug SouSam Kaili Castille, MD 06/08/16 [phone]

## 2016-06-07 NOTE — Progress Notes (Signed)
Patient listed as having [employer] insurance without a pcp.  EDCM spoke to patient at bedside.  Patient reports her pcp is NP Cherylann [facility]elheresa Anderson at [facility] [place].  EPIC updated.

## 2016-06-07 NOTE — ED Triage Notes (Signed)
Pt states she had [facility] Aug [facility]th [facility] by Dr Rhys MartiniQuardri  Pt states she had a stimulator removed and a pump placed  Pt states since the surgery she has had a spinal fluid leak causing her severe headaches and is scheduled for a blood patch on Monday  Pt states yesterday she started having pain in her back between her shoulder blades and has a productive cough with green sputum

## 2016-06-07 NOTE — ED Notes (Signed)
Pt transported to XR.  

## 2016-06-07 NOTE — ED Notes (Signed)
MD at bedside. 

## 2016-06-08 LAB — PATHOLOGIST SMEAR REVIEW

## 2016-06-08 NOTE — Discharge Instructions (Signed)
Dr. Iantha FallenQaori's office will call you tomorrow to arrange to be seen on Friday, August 18. Get your blood pressure recheck within the next one or 2 weeks. Today's was elevated at 165/105

## 2016-07-04 ENCOUNTER — Emergency Department (HOSPITAL_COMMUNITY): Payer: BC Managed Care – PPO

## 2016-07-04 ENCOUNTER — Inpatient Hospital Stay (HOSPITAL_COMMUNITY)
Admission: EM | Admit: 2016-07-04 | Discharge: 2016-07-14 | DRG: 603 | Disposition: A | Discharge status: Other Acute Inpatient Hospital | Payer: BC Managed Care – PPO | Attending: Internal Medicine | Admitting: Internal Medicine

## 2016-07-04 ENCOUNTER — Encounter (HOSPITAL_COMMUNITY): Payer: Self-pay | Admitting: Emergency Medicine

## 2016-07-04 DIAGNOSIS — N390 Urinary tract infection, site not specified: Secondary | ICD-10-CM | POA: Diagnosis present

## 2016-07-04 DIAGNOSIS — E2740 Unspecified adrenocortical insufficiency: Secondary | ICD-10-CM | POA: Diagnosis present

## 2016-07-04 DIAGNOSIS — A5002 Early congenital syphilitic osteochondropathy: Secondary | ICD-10-CM | POA: Diagnosis present

## 2016-07-04 DIAGNOSIS — Z9049 Acquired absence of other specified parts of digestive tract: Secondary | ICD-10-CM

## 2016-07-04 DIAGNOSIS — G4733 Obstructive sleep apnea (adult) (pediatric): Secondary | ICD-10-CM | POA: Diagnosis present

## 2016-07-04 DIAGNOSIS — Z9889 Other specified postprocedural states: Secondary | ICD-10-CM | POA: Diagnosis not present

## 2016-07-04 DIAGNOSIS — G620 Drug-induced polyneuropathy: Secondary | ICD-10-CM | POA: Diagnosis present

## 2016-07-04 DIAGNOSIS — Z8619 Personal history of other infectious and parasitic diseases: Secondary | ICD-10-CM

## 2016-07-04 DIAGNOSIS — I9589 Other hypotension: Secondary | ICD-10-CM

## 2016-07-04 DIAGNOSIS — R21 Rash and other nonspecific skin eruption: Secondary | ICD-10-CM | POA: Diagnosis present

## 2016-07-04 DIAGNOSIS — N183 Chronic kidney disease, stage 3 unspecified: Secondary | ICD-10-CM | POA: Diagnosis present

## 2016-07-04 DIAGNOSIS — E669 Obesity, unspecified: Secondary | ICD-10-CM | POA: Diagnosis present

## 2016-07-04 DIAGNOSIS — L02416 Cutaneous abscess of left lower limb: Principal | ICD-10-CM | POA: Diagnosis present

## 2016-07-04 DIAGNOSIS — L0293 Carbuncle, unspecified: Secondary | ICD-10-CM | POA: Diagnosis present

## 2016-07-04 DIAGNOSIS — R509 Fever, unspecified: Secondary | ICD-10-CM | POA: Diagnosis present

## 2016-07-04 DIAGNOSIS — T451X5A Adverse effect of antineoplastic and immunosuppressive drugs, initial encounter: Secondary | ICD-10-CM | POA: Diagnosis present

## 2016-07-04 DIAGNOSIS — E1122 Type 2 diabetes mellitus with diabetic chronic kidney disease: Secondary | ICD-10-CM | POA: Diagnosis present

## 2016-07-04 DIAGNOSIS — I951 Orthostatic hypotension: Secondary | ICD-10-CM | POA: Diagnosis not present

## 2016-07-04 DIAGNOSIS — Z6837 Body mass index (BMI) 37.0-37.9, adult: Secondary | ICD-10-CM

## 2016-07-04 DIAGNOSIS — Z833 Family history of diabetes mellitus: Secondary | ICD-10-CM

## 2016-07-04 DIAGNOSIS — I129 Hypertensive chronic kidney disease with stage 1 through stage 4 chronic kidney disease, or unspecified chronic kidney disease: Secondary | ICD-10-CM | POA: Diagnosis present

## 2016-07-04 DIAGNOSIS — M170 Bilateral primary osteoarthritis of knee: Secondary | ICD-10-CM | POA: Diagnosis present

## 2016-07-04 DIAGNOSIS — M313 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: Secondary | ICD-10-CM | POA: Diagnosis present

## 2016-07-04 DIAGNOSIS — G629 Polyneuropathy, unspecified: Secondary | ICD-10-CM

## 2016-07-04 DIAGNOSIS — G8929 Other chronic pain: Secondary | ICD-10-CM | POA: Diagnosis present

## 2016-07-04 DIAGNOSIS — Z79899 Other long term (current) drug therapy: Secondary | ICD-10-CM

## 2016-07-04 DIAGNOSIS — J312 Chronic pharyngitis: Secondary | ICD-10-CM | POA: Diagnosis present

## 2016-07-04 DIAGNOSIS — E876 Hypokalemia: Secondary | ICD-10-CM | POA: Diagnosis present

## 2016-07-04 DIAGNOSIS — B952 Enterococcus as the cause of diseases classified elsewhere: Secondary | ICD-10-CM | POA: Diagnosis present

## 2016-07-04 DIAGNOSIS — R042 Hemoptysis: Secondary | ICD-10-CM | POA: Diagnosis not present

## 2016-07-04 DIAGNOSIS — M25561 Pain in right knee: Secondary | ICD-10-CM

## 2016-07-04 DIAGNOSIS — Z87898 Personal history of other specified conditions: Secondary | ICD-10-CM

## 2016-07-04 DIAGNOSIS — I959 Hypotension, unspecified: Secondary | ICD-10-CM | POA: Diagnosis present

## 2016-07-04 DIAGNOSIS — Z886 Allergy status to analgesic agent status: Secondary | ICD-10-CM

## 2016-07-04 DIAGNOSIS — Z7952 Long term (current) use of systemic steroids: Secondary | ICD-10-CM

## 2016-07-04 DIAGNOSIS — Z978 Presence of other specified devices: Secondary | ICD-10-CM | POA: Diagnosis not present

## 2016-07-04 DIAGNOSIS — K5900 Constipation, unspecified: Secondary | ICD-10-CM | POA: Diagnosis present

## 2016-07-04 DIAGNOSIS — M9080 Osteopathy in diseases classified elsewhere, unspecified site: Secondary | ICD-10-CM

## 2016-07-04 DIAGNOSIS — Z8249 Family history of ischemic heart disease and other diseases of the circulatory system: Secondary | ICD-10-CM

## 2016-07-04 DIAGNOSIS — R040 Epistaxis: Secondary | ICD-10-CM | POA: Diagnosis not present

## 2016-07-04 DIAGNOSIS — Z880 Allergy status to penicillin: Secondary | ICD-10-CM

## 2016-07-04 DIAGNOSIS — D72829 Elevated white blood cell count, unspecified: Secondary | ICD-10-CM | POA: Diagnosis not present

## 2016-07-04 LAB — URINE MICROSCOPIC-ADD ON: RBC / HPF: NONE SEEN RBC/hpf (ref 0–5)

## 2016-07-04 LAB — CBC WITH DIFFERENTIAL/PLATELET
Basophils Absolute: 0.1 10*3/uL (ref 0.0–0.1)
Basophils Relative: 1 %
Eosinophils Absolute: 0 10*3/uL (ref 0.0–0.7)
Eosinophils Relative: 0 %
HCT: 44.3 % (ref 36.0–46.0)
HEMOGLOBIN: 14.4 g/dL (ref 12.0–15.0)
LYMPHS ABS: 3.8 10*3/uL (ref 0.7–4.0)
LYMPHS PCT: 50 %
MCH: 29.8 pg (ref 26.0–34.0)
MCHC: 32.5 g/dL (ref 30.0–36.0)
MCV: 91.7 fL (ref 78.0–100.0)
Monocytes Absolute: 0.3 10*3/uL (ref 0.1–1.0)
Monocytes Relative: 3 %
NEUTROS PCT: 46 %
Neutro Abs: 3.6 10*3/uL (ref 1.7–7.7)
Platelets: 303 10*3/uL (ref 150–400)
RBC: 4.83 MIL/uL (ref 3.87–5.11)
RDW: 15.2 % (ref 11.5–15.5)
WBC: 7.7 10*3/uL (ref 4.0–10.5)

## 2016-07-04 LAB — URINALYSIS, ROUTINE W REFLEX MICROSCOPIC
Bilirubin Urine: NEGATIVE
GLUCOSE, UA: NEGATIVE mg/dL
Hgb urine dipstick: NEGATIVE
KETONES UR: NEGATIVE mg/dL
Nitrite: NEGATIVE
PH: 6.5 (ref 5.0–8.0)
Protein, ur: NEGATIVE mg/dL
Specific Gravity, Urine: 1.02 (ref 1.005–1.030)

## 2016-07-04 LAB — BASIC METABOLIC PANEL
Anion gap: 8 (ref 5–15)
BUN: 15 mg/dL (ref 6–20)
CHLORIDE: 106 mmol/L (ref 101–111)
CO2: 21 mmol/L — ABNORMAL LOW (ref 22–32)
Calcium: 8.9 mg/dL (ref 8.9–10.3)
Creatinine, Ser: 1.39 mg/dL — ABNORMAL HIGH (ref 0.44–1.00)
GFR calc non Af Amer: 43 mL/min — ABNORMAL LOW (ref >60)
GFR, EST AFRICAN AMERICAN: 50 mL/min — AB (ref >60)
Glucose, Bld: 90 mg/dL (ref 65–99)
POTASSIUM: 3.7 mmol/L (ref 3.5–5.1)
SODIUM: 135 mmol/L (ref 135–145)

## 2016-07-04 LAB — I-STAT CG4 LACTIC ACID, ED: LACTIC ACID, VENOUS: 0.96 mmol/L (ref 0.5–1.9)

## 2016-07-04 MED ORDER — HYDROCODONE-ACETAMINOPHEN 5-325 MG PO TABS
1.0000 | ORAL_TABLET | ORAL | Status: DC | PRN
Start: 2016-07-04 — End: 2016-07-14
  Administered 2016-07-04: 1 via ORAL
  Administered 2016-07-05 – 2016-07-14 (×28): 2 via ORAL
  Filled 2016-07-04 (×23): qty 2
  Filled 2016-07-04: qty 1
  Filled 2016-07-04 (×6): qty 2

## 2016-07-04 MED ORDER — B COMPLEX PO TABS: 1.0000 | ORAL_TABLET | Freq: Every day | ORAL | Status: DC

## 2016-07-04 MED ORDER — MORPHINE SULFATE (PF) 4 MG/ML IV SOLN: 4.0000 mg | Freq: Once | INTRAVENOUS | Status: DC

## 2016-07-04 MED ORDER — TRAZODONE HCL 50 MG PO TABS
50.0000 mg | ORAL_TABLET | Freq: Every day | ORAL | Status: DC
  Administered 2016-07-04 – 2016-07-13 (×10): 50 mg via ORAL
  Filled 2016-07-04 (×10): qty 1

## 2016-07-04 MED ORDER — VITAMIN B-12 1000 MCG PO TABS
1000.0000 ug | ORAL_TABLET | Freq: Every day | ORAL | Status: DC
  Administered 2016-07-04 – 2016-07-14 (×11): 1000 ug via ORAL
  Filled 2016-07-04 (×11): qty 1

## 2016-07-04 MED ORDER — ONDANSETRON HCL 4 MG/2ML IJ SOLN: 4.0000 mg | Freq: Three times a day (TID) | INTRAMUSCULAR | Status: AC | PRN

## 2016-07-04 MED ORDER — ONDANSETRON HCL 4 MG/2ML IJ SOLN
4.0000 mg | Freq: Once | INTRAMUSCULAR | Status: AC
  Administered 2016-07-04: 4 mg via INTRAVENOUS
  Filled 2016-07-04: qty 2

## 2016-07-04 MED ORDER — PREDNISONE 5 MG PO TABS
5.0000 mg | ORAL_TABLET | Freq: Every day | ORAL | Status: DC
  Administered 2016-07-05 – 2016-07-13 (×9): 5 mg via ORAL
  Filled 2016-07-04 (×9): qty 1

## 2016-07-04 MED ORDER — LACOSAMIDE 50 MG PO TABS
100.0000 mg | ORAL_TABLET | Freq: Two times a day (BID) | ORAL | Status: DC
  Administered 2016-07-04 – 2016-07-14 (×20): 100 mg via ORAL
  Filled 2016-07-04 (×21): qty 2

## 2016-07-04 MED ORDER — FOLIC ACID 1 MG PO TABS
1.0000 mg | ORAL_TABLET | Freq: Every day | ORAL | Status: DC
  Administered 2016-07-04 – 2016-07-14 (×11): 1 mg via ORAL
  Filled 2016-07-04 (×11): qty 1

## 2016-07-04 MED ORDER — VITAMIN D (ERGOCALCIFEROL) 1.25 MG (50000 UNIT) PO CAPS
50000.0000 [IU] | ORAL_CAPSULE | ORAL | Status: DC
  Administered 2016-07-07 – 2016-07-14 (×2): 50000 [IU] via ORAL
  Filled 2016-07-04 (×2): qty 1

## 2016-07-04 MED ORDER — FENTANYL CITRATE (PF) 100 MCG/2ML IJ SOLN
50.0000 ug | Freq: Once | INTRAMUSCULAR | Status: AC
  Administered 2016-07-04: 50 ug via INTRAVENOUS
  Filled 2016-07-04: qty 2

## 2016-07-04 MED ORDER — METHOTREXATE SODIUM CHEMO INJECTION 250 MG/10ML: 25.0000 mg | INTRAMUSCULAR | Status: DC

## 2016-07-04 MED ORDER — VANCOMYCIN HCL 10 G IV SOLR: 1250.0000 mg | INTRAVENOUS | Status: DC

## 2016-07-04 MED ORDER — FLUTICASONE PROPIONATE 50 MCG/ACT NA SUSP
2.0000 | Freq: Every day | NASAL | Status: DC | PRN
  Administered 2016-07-06: 2 via NASAL
  Filled 2016-07-04: qty 16

## 2016-07-04 MED ORDER — GABAPENTIN 400 MG PO CAPS
1200.0000 mg | ORAL_CAPSULE | Freq: Every day | ORAL | Status: DC
  Administered 2016-07-05 – 2016-07-14 (×10): 1200 mg via ORAL
  Filled 2016-07-04 (×10): qty 3

## 2016-07-04 MED ORDER — TOPIRAMATE 25 MG PO TABS
50.0000 mg | ORAL_TABLET | Freq: Every day | ORAL | Status: DC
  Administered 2016-07-04 – 2016-07-13 (×10): 50 mg via ORAL
  Filled 2016-07-04 (×10): qty 2

## 2016-07-04 MED ORDER — B COMPLEX-C PO TABS
1.0000 | ORAL_TABLET | Freq: Every day | ORAL | Status: DC
  Administered 2016-07-04 – 2016-07-14 (×11): 1 via ORAL
  Filled 2016-07-04 (×11): qty 1

## 2016-07-04 MED ORDER — ACETAMINOPHEN 325 MG PO TABS
650.0000 mg | ORAL_TABLET | Freq: Four times a day (QID) | ORAL | Status: DC | PRN
  Administered 2016-07-04 – 2016-07-11 (×12): 650 mg via ORAL
  Filled 2016-07-04 (×12): qty 2

## 2016-07-04 MED ORDER — PANTOPRAZOLE SODIUM 40 MG PO TBEC
40.0000 mg | DELAYED_RELEASE_TABLET | Freq: Every day | ORAL | Status: DC
  Administered 2016-07-04 – 2016-07-06 (×3): 40 mg via ORAL
  Filled 2016-07-04 (×3): qty 1

## 2016-07-04 MED ORDER — SODIUM CHLORIDE 0.9 % IV BOLUS (SEPSIS)
1000.0000 mL | Freq: Once | INTRAVENOUS | Status: AC
  Administered 2016-07-04: 1000 mL via INTRAVENOUS

## 2016-07-04 MED ORDER — TRAMADOL HCL 50 MG PO TABS
50.0000 mg | ORAL_TABLET | Freq: Four times a day (QID) | ORAL | Status: DC | PRN
  Administered 2016-07-04: 100 mg via ORAL
  Administered 2016-07-06 – 2016-07-08 (×2): 50 mg via ORAL
  Filled 2016-07-04: qty 2
  Filled 2016-07-04 (×2): qty 1

## 2016-07-04 MED ORDER — SODIUM CHLORIDE 0.9 % IV SOLN
INTRAVENOUS | Status: DC
  Administered 2016-07-05 – 2016-07-07 (×4): via INTRAVENOUS

## 2016-07-04 MED ORDER — DULOXETINE HCL 30 MG PO CPEP
60.0000 mg | ORAL_CAPSULE | Freq: Two times a day (BID) | ORAL | Status: DC
  Administered 2016-07-04 – 2016-07-14 (×20): 60 mg via ORAL
  Filled 2016-07-04 (×20): qty 2

## 2016-07-04 MED ORDER — ALBUTEROL SULFATE (2.5 MG/3ML) 0.083% IN NEBU: 3.0000 mL | INHALATION_SOLUTION | Freq: Four times a day (QID) | RESPIRATORY_TRACT | Status: DC | PRN

## 2016-07-04 MED ORDER — FLUDROCORTISONE ACETATE 0.1 MG PO TABS
0.1000 mg | ORAL_TABLET | Freq: Every day | ORAL | Status: DC
  Administered 2016-07-04 – 2016-07-14 (×11): 0.1 mg via ORAL
  Filled 2016-07-04 (×11): qty 1

## 2016-07-04 MED ORDER — GABAPENTIN 400 MG PO CAPS
1600.0000 mg | ORAL_CAPSULE | Freq: Every day | ORAL | Status: DC
  Administered 2016-07-04 – 2016-07-13 (×10): 1600 mg via ORAL
  Filled 2016-07-04 (×10): qty 4

## 2016-07-04 MED ORDER — ACETAMINOPHEN 650 MG RE SUPP: 650.0000 mg | Freq: Four times a day (QID) | RECTAL | Status: DC | PRN

## 2016-07-04 MED ORDER — SODIUM CHLORIDE 0.9 % IV SOLN
1500.0000 mg | Freq: Once | INTRAVENOUS | Status: AC
  Administered 2016-07-04: 1500 mg via INTRAVENOUS
  Filled 2016-07-04: qty 1500

## 2016-07-04 MED ORDER — SODIUM CHLORIDE 0.9 % IV SOLN
INTRAVENOUS | Status: AC
  Administered 2016-07-04: 21:00:00 via INTRAVENOUS

## 2016-07-04 NOTE — ED Notes (Signed)
Pt requesting ultrasound IV. Charge RN asked to attempt ultrasound IV.

## 2016-07-04 NOTE — H&P (Signed)
History and Physical    [name] WUJ:[id number] DOB: [birth year] DOA: 07/04/2016  Referring MD/NP/PA: Dr. Tarri Abernethy  PCP: Dr. Dareen Piano    Outpatient Specialists: none   Patient coming from: home  Chief Complaint: fever, rash  HPI: [name] is a 50 y.o. 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 She has been seen by primary care physician who referred her to emergency room for evaluation. Patient reported that rash started initially on lower extremities and then spread to her trunk. She reports no exposure to ticks. No reports of IV drug abuse. She did not know what the maximum temperature was but her fever has been intermittent and worse over past 24 hours. No reports of chest pain or shortness of breath. No cough. No abdominal pain, nausea, vomiting or diarrhea.  ED Course: in ED, patient is hypotensive with blood pressure 82/61. She has received IV fluids which has temporarily normalized blood pressure but her current blood pressure is still low 91/57. She has generalized maculopapular rash over trunk and lower extremities. Infectious disease has seen the patient in consultation. They recommend starting vancomycin and obtaining blood cultures.  Review of Systems:  Constitutional: positive for fever, negative forchills, diaphoresis, activity change, appetite change and fatigue.  HENT: Negative for ear pain, nosebleeds, congestion, facial swelling, rhinorrhea, neck pain, neck stiffness and ear discharge.   Eyes: Negative for pain, discharge, redness, itching and visual disturbance.  Respiratory: Negative for cough, choking, chest tightness, shortness of breath, wheezing and stridor.   Cardiovascular: Negative for chest pain, palpitations and  leg swelling.  Gastrointestinal: Negative for abdominal distention.  Genitourinary: Negative for dysuria, urgency, frequency, hematuria, flank pain, decreased urine volume, difficulty urinating and dyspareunia.  Musculoskeletal: Negative for back pain, joint swelling, arthralgias and gait problem.  Neurological: Negative for dizziness, tremors, seizures, syncope, facial asymmetry, speech difficulty, weakness, light-headedness, numbness and headaches.  Hematological: Negative for adenopathy. Does not bruise/bleed easily.  Skin: Positive for generalized rash Psychiatric/Behavioral: Negative for hallucinations, behavioral problems, confusion, dysphoric mood, decreased concentration and agitation.   Past Medical History:  Diagnosis Date  . Chronic cough   . Diabetes mellitus without complication (HCC)   . Hypertension   . OSA (obstructive sleep apnea)   . Peripheral neuropathy (HCC)   . Wegener's granulomatosis (HCC) 2009  . Wegner's disease (congenital syphilitic osteochondritis)     Past Surgical History:  Procedure Laterality Date  . APPENDECTOMY    . BACK SURGERY    . CHOLECYSTECTOMY    . COLONOSCOPY N/A 06/02/2013   Procedure: COLONOSCOPY;  Surgeon: Graylin Shiver, MD;  Location: [facility] ENDOSCOPY;  Service: Endoscopy;  Laterality: N/A;  . HERNIA REPAIR  2000  . SEPTOPLASTY  02/2013  . spleen anuyism    . splenic aneurysm    . VAGINAL HYSTERECTOMY      Social history:  reports that she has never smoked. She has never used smokeless tobacco. She reports that she drinks alcohol. She reports that she does not use drugs.  Ambulation:ambulates without the assistance at baseline  Allergies  Allergen Reactions  . Motrin [Ibuprofen] Other (See Comments)    Pt is unable to take this due to kidney problems.    . Penicillins Rash and Other (See Comments)    Has patient had a PCN reaction causing immediate rash, facial/tongue/throat swelling,  SOB or lightheadedness with hypotension: No Has  patient had a PCN reaction causing severe rash involving mucus membranes or skin necrosis: No Has patient had a PCN reaction that required hospitalization No Has patient had a PCN reaction occurring within the last 10 years: No If all of the above answers are "NO", then may proceed with Cephalosporin use.    Family History  Problem Relation Age of Onset  . Diabetes Mother   . Heart disease Mother   . Diabetes Father     Prior to Admission medications   Medication Sig Start Date End Date Taking? Authorizing Provider  albuterol (PROVENTIL HFA;VENTOLIN HFA) 108 (90 Base) MCG/ACT inhaler Inhale 2 puffs into the lungs every 6 (six) hours as needed for wheezing or shortness of breath.   Yes Historical Provider, MD  b complex vitamins tablet Take 1 tablet by mouth daily.   Yes Historical Provider, MD  DULoxetine (CYMBALTA) 60 MG capsule Take 60 mg by mouth 2 (two) times daily.   Yes Historical Provider, MD  esomeprazole (NEXIUM) 40 MG capsule Take 40 mg by mouth daily.    Yes Historical Provider, MD  fludrocortisone (FLORINEF) 0.1 MG tablet Take 0.1 mg by mouth daily.   Yes Historical Provider, MD  fluticasone (FLONASE) 50 MCG/ACT nasal spray Place 2 sprays into both nostrils daily as needed for rhinitis.   Yes Historical Provider, MD  folic acid (FOLVITE) 1 MG tablet Take 1 mg by mouth daily.   Yes Historical Provider, MD  furosemide (LASIX) 40 MG tablet Take 40 mg by mouth daily as needed for edema.   Yes Historical Provider, MD  gabapentin (NEURONTIN) 300 MG capsule Take 1,200-1,600 mg by mouth 2 (two) times daily. Pt takes three capsules in the morning and four at night.   Yes Historical Provider, MD  hydrochlorothiazide (HYDRODIURIL) 25 MG tablet Take 25 mg by mouth daily.   Yes Historical Provider, MD  Lacosamide (VIMPAT) 100 MG TABS Take 100 mg by mouth 2 (two) times daily.   Yes Historical Provider, MD  lidocaine (LIDODERM) 5 % Place 1 patch onto the skin daily as needed (for pain). Remove  & Discard patch within 12 hours or as directed by MD   Yes Historical Provider, MD  losartan (COZAAR) 25 MG tablet Take 25 mg by mouth daily.   Yes Historical Provider, MD  methotrexate 250 MG/10ML injection Inject 25 mg into the vein once a week. Pt uses on Friday.   Yes Historical Provider, MD  nystatin (NYSTATIN) powder Apply 1 g topically 2 (two) times daily as needed (for irritation).   Yes Historical Provider, MD  nystatin-triamcinolone (MYCOLOG II) cream Apply 1 application topically 2 (two) times daily as needed (for irritation).   Yes Historical Provider, MD  pantoprazole (PROTONIX) 40 MG tablet Take 40 mg by mouth daily.   Yes Historical Provider, MD  potassium chloride SA (K-DUR,KLOR-CON) 20 MEQ tablet Take 20 mEq by mouth daily as needed (when taking Lasix).   Yes Historical Provider, MD  predniSONE (DELTASONE) 5 MG tablet Take 5 mg by mouth daily.   Yes Historical Provider, MD  sulfamethoxazole-trimethoprim (BACTRIM DS) 800-160 MG per tablet Take 1 tablet by mouth every Monday, Wednesday, and Friday.   Yes Historical Provider, MD  topiramate (TOPAMAX) 50 MG tablet Take 50 mg by mouth at bedtime.   Yes Historical Provider, MD  traMADol (ULTRAM) 50 MG tablet Take 50-100 mg by mouth every 6 (six) hours as needed for moderate pain.   Yes Historical  Provider, MD  traZODone (DESYREL) 50 MG tablet Take 50 mg by mouth at bedtime.   Yes Historical Provider, MD  vitamin B-12 (CYANOCOBALAMIN) 1000 MCG tablet Take 1,000 mcg by mouth daily.   Yes Historical Provider, MD  Vitamin D, Ergocalciferol, (DRISDOL) 50000 units CAPS capsule Take 50,000 Units by mouth every 7 (seven) days. Pt takes on Friday.   Yes Historical Provider, MD    Physical Exam: Vitals:   07/04/16 1444 07/04/16 1600 07/04/16 1608 07/04/16 1632  BP: 98/68 108/73 108/73 99/55  Pulse: 78 77 73 78  Resp: 17 20 22  (!) 28  Temp:      TempSrc:      SpO2: 96% 96% 97% 99%  Weight:      Height:        Constitutional: NAD, calm,  comfortable Vitals:   07/04/16 1444 07/04/16 1600 07/04/16 1608 07/04/16 1632  BP: 98/68 108/73 108/73 99/55  Pulse: 78 77 73 78  Resp: 17 20 22  (!) 28  Temp:      TempSrc:      SpO2: 96% 96% 97% 99%  Weight:      Height:       Eyes: PERRL, lids and conjunctivae normal ENMT: Mucous membranes are moist. Posterior pharynx clear of any exudate or lesions.Normal dentition.  Neck: normal, supple, no masses, no thyromegaly Respiratory: clear to auscultation bilaterally, no wheezing, no crackles. Normal respiratory effort. No accessory muscle use.  Cardiovascular: Regular rate and rhythm, no murmurs / rubs / gallops. No extremity edema. 2+ pedal pulses. No carotid bruits.  Abdomen: no tenderness, no masses palpated. No hepatosplenomegaly. Bowel sounds positive.  Musculoskeletal: no clubbing / cyanosis. No joint deformity upper and lower extremities. Good ROM, no contractures. Normal muscle tone.  Skin: generalized maculopapular rash over trunk, lower extremities. Skin is warm and dry Neurologic: CN 2-12 grossly intact. Sensation intact, DTR normal. Strength 5/5 in all 4.  Psychiatric: Normal judgment and insight. Alert and oriented x 3. Normal mood.   Labs on Admission: I have personally reviewed following labs and imaging studies  CBC:  Recent Labs Lab 07/04/16 1444  WBC 7.7  NEUTROABS 3.6  HGB 14.4  HCT 44.3  MCV 91.7  PLT 303   Basic Metabolic Panel:  Recent Labs Lab 07/04/16 1444  NA 135  K 3.7  CL 106  CO2 21*  GLUCOSE 90  BUN 15  CREATININE 1.39*  CALCIUM 8.9   GFR: Estimated Creatinine Clearance: 58.9 mL/min (by C-G formula based on SCr of 1.39 mg/dL). Liver Function Tests: No results for input(s): AST, ALT, ALKPHOS, BILITOT, PROT, ALBUMIN in the last 168 hours. No results for input(s): LIPASE, AMYLASE in the last 168 hours. No results for input(s): AMMONIA in the last 168 hours. Coagulation Profile: No results for input(s): INR, PROTIME in the last 168  hours. Cardiac Enzymes: No results for input(s): CKTOTAL, CKMB, CKMBINDEX, TROPONINI in the last 168 hours. BNP (last 3 results) No results for input(s): PROBNP in the last 8760 hours. HbA1C: No results for input(s): HGBA1C in the last 72 hours. CBG: No results for input(s): GLUCAP in the last 168 hours. Lipid Profile: No results for input(s): CHOL, HDL, LDLCALC, TRIG, CHOLHDL, LDLDIRECT in the last 72 hours. Thyroid Function Tests: No results for input(s): TSH, T4TOTAL, FREET4, T3FREE, THYROIDAB in the last 72 hours. Anemia Panel: No results for input(s): VITAMINB12, FOLATE, FERRITIN, TIBC, IRON, RETICCTPCT in the last 72 hours. Urine analysis:    Component Value Date/Time   COLORURINE YELLOW  07/04/2016 1400   APPEARANCEUR CLOUDY (A) 07/04/2016 1400   LABSPEC 1.020 07/04/2016 1400   PHURINE 6.5 07/04/2016 1400   GLUCOSEU NEGATIVE 07/04/2016 1400   HGBUR NEGATIVE 07/04/2016 1400   BILIRUBINUR NEGATIVE 07/04/2016 1400   KETONESUR NEGATIVE 07/04/2016 1400   PROTEINUR NEGATIVE 07/04/2016 1400   UROBILINOGEN 0.2 05/30/2013 1112   NITRITE NEGATIVE 07/04/2016 1400   LEUKOCYTESUR MODERATE (A) 07/04/2016 1400   Sepsis Labs: @LABRCNTIP (procalcitonin:4,lacticidven:4) )No results found for this or any previous visit (from the past 240 hour(s)).   Radiological Exams on Admission: Dg Chest 2 View  Result Date: 07/04/2016 CLINICAL DATA:  Cough.  Fevers.  Rash. EXAM: CHEST  2 VIEW COMPARISON:  06/07/2016 chest radiograph. FINDINGS: Stable cardiomediastinal silhouette with normal heart size. No pneumothorax. No pleural effusion. Lungs appear clear, with no acute consolidative airspace disease and no pulmonary edema. Minimal thoracic spondylosis. Cholecystectomy clips are seen in the right upper quadrant of the abdomen. IMPRESSION: No active cardiopulmonary disease. Electronically Signed   By: Delbert PhenixJason A Poff M.D.   On: 07/04/2016 14:44    EKG: Not done in ED  Assessment/Plan  Principal  Problem:   Fever / Generalized maculopapular rash / Hypotension - Patient presented with fever, generalized painful rash and hypotension. She she has required IV fluids on the admission which has temporarily normalize her blood pressure to 108/73 but her current BP again 91/57. Please note that the patient has history of adrenal insufficiency and is on Florinef as well as low dose prednisone - Presentation with fever and generalized rash is worrisome for acute infectious process, possible MRSA  - Seen by infectious disease who recommended vancomycin - Follow-up blood culture results  Active Problems:   Wegner's disease (congenital syphilitic osteochondritis) - Continue low-dose prednisone    Adrenal insufficiency (HCC) - Continue Florinef, Methotrexate    OSA (obstructive sleep apnea) - Stable respiratory status    Peripheral neuropathy (HCC) - Continue gabapentin    Essential hypertension - Blood pressure medications on hold due to soft blood pressure    CKD (chronic kidney disease) stage 3, GFR 30-59 ml/min - Baseline creatinine about 3 years ago is 1.14 - Creatinine on this admission is 1.3. We will hold off on Lasix as this could have contributed to worsening renal function - Follow-up BMP tomorrow morning    DVT prophylaxis: SCD's bilaterally  Code Status: full code  Family Communication: husband at the bedside   Disposition Plan: medical floor  Consults called: ID, Dr. Cliffton Astersampbell John Admission status: inpatient admission. Patient was hypotensive on the admission. She reported intermittent fevers and painful rash for last 4 days prior to this admission. She has also seen primary care physician who referred the patient for further evaluation to ED. Patient requires inpatient workup and treatment tincluding blood cultures, vancomycin. I anticipate she will require at least 2-3 days in the hospital specifically the time length for blood culture results. We consulted infectious  disease who also recognizes the importance of admission and inpatient evaluation.    Manson PasseyEVINE, Tynia Wiers MD [facility] Pager [phone]  If 7PM-7AM, please contact night-coverage [email or web address] Password [email or web address]  07/04/2016, 5:54 PM

## 2016-07-04 NOTE — ED Provider Notes (Signed)
[facility]-EMERGENCY DEPT Provider Note   CSN: [id number] Arrival date & time: 07/04/16  1115     History   Chief Complaint Chief Complaint  Patient presents with  . Rash    small red spots over whole body  . Fever    x3 days-intermittent    HPI [name] is a 50 y.o. female.  HPI   Pt with hx Wegener's disease, DM, HTN p/w fevers to 101, painful rash, myalgias, and arthralgias that began over the past 2-3 days.  The aching is in every joint.  Has chronic headaches that were initially improving but now coming back (more mild than her typical headaches).  She is lightheaded, vomited once this morning, constipated, increased chronic sore throat and cough, urinates a lot.  Had recent antibiotic treatment for bronchitis.  Is on daily prednisone 5mg .   No recent sick contacts, no recent travel.  Denies tick bites.  Denies sinus pressure, abdominal pain.    Past Medical History:  Diagnosis Date  . Chronic cough   . Diabetes mellitus without complication (HCC)   . Hypertension   . OSA (obstructive sleep apnea)   . Peripheral neuropathy (HCC)   . Wegener's granulomatosis (HCC) 2009  . Wegner's disease (congenital syphilitic osteochondritis)     Patient Active Problem List   Diagnosis Date Noted  . OSA (obstructive sleep apnea) 07/04/2016  . Peripheral neuropathy (HCC) 07/04/2016  . Fever 07/04/2016  . Hypotension 07/04/2016  . Rash 07/04/2016  . CKD (chronic kidney disease) stage 3, GFR 30-59 ml/min 07/04/2016  . Adrenal insufficiency (HCC) 05/30/2013  . Wegner's disease (congenital syphilitic osteochondritis)     Past Surgical History:  Procedure Laterality Date  . APPENDECTOMY    . BACK SURGERY    . CHOLECYSTECTOMY    . COLONOSCOPY N/A 06/02/2013   Procedure: COLONOSCOPY;  Surgeon: Graylin Shiver, MD;  Location: [name] ENDOSCOPY;  Service: Endoscopy;  Laterality: N/A;  . HERNIA REPAIR  2000  . SEPTOPLASTY  02/2013  . spleen anuyism    . splenic aneurysm    . VAGINAL  HYSTERECTOMY      OB History    No data available       Home Medications    Prior to Admission medications   Medication Sig Start Date End Date Taking? Authorizing Provider  albuterol (PROVENTIL HFA;VENTOLIN HFA) 108 (90 Base) MCG/ACT inhaler Inhale 2 puffs into the lungs every 6 (six) hours as needed for wheezing or shortness of breath.   Yes Historical Provider, MD  b complex vitamins tablet Take 1 tablet by mouth daily.   Yes Historical Provider, MD  DULoxetine (CYMBALTA) 60 MG capsule Take 60 mg by mouth 2 (two) times daily.   Yes Historical Provider, MD  esomeprazole (NEXIUM) 40 MG capsule Take 40 mg by mouth daily.    Yes Historical Provider, MD  fludrocortisone (FLORINEF) 0.1 MG tablet Take 0.1 mg by mouth daily.   Yes Historical Provider, MD  fluticasone (FLONASE) 50 MCG/ACT nasal spray Place 2 sprays into both nostrils daily as needed for rhinitis.   Yes Historical Provider, MD  folic acid (FOLVITE) 1 MG tablet Take 1 mg by mouth daily.   Yes Historical Provider, MD  furosemide (LASIX) 40 MG tablet Take 40 mg by mouth daily as needed for edema.   Yes Historical Provider, MD  gabapentin (NEURONTIN) 300 MG capsule Take 1,200-1,600 mg by mouth 2 (two) times daily. Pt takes three capsules in the morning and four at night.   Yes  Historical Provider, MD  hydrochlorothiazide (HYDRODIURIL) 25 MG tablet Take 25 mg by mouth daily.   Yes Historical Provider, MD  Lacosamide (VIMPAT) 100 MG TABS Take 100 mg by mouth 2 (two) times daily.   Yes Historical Provider, MD  lidocaine (LIDODERM) 5 % Place 1 patch onto the skin daily as needed (for pain). Remove & Discard patch within 12 hours or as directed by MD   Yes Historical Provider, MD  losartan (COZAAR) 25 MG tablet Take 25 mg by mouth daily.   Yes Historical Provider, MD  methotrexate 250 MG/10ML injection Inject 25 mg into the vein once a week. Pt uses on Friday.   Yes Historical Provider, MD  nystatin (NYSTATIN) powder Apply 1 g topically 2  (two) times daily as needed (for irritation).   Yes Historical Provider, MD  nystatin-triamcinolone (MYCOLOG II) cream Apply 1 application topically 2 (two) times daily as needed (for irritation).   Yes Historical Provider, MD  pantoprazole (PROTONIX) 40 MG tablet Take 40 mg by mouth daily.   Yes Historical Provider, MD  potassium chloride SA (K-DUR,KLOR-CON) 20 MEQ tablet Take 20 mEq by mouth daily as needed (when taking Lasix).   Yes Historical Provider, MD  predniSONE (DELTASONE) 5 MG tablet Take 5 mg by mouth daily.   Yes Historical Provider, MD  sulfamethoxazole-trimethoprim (BACTRIM DS) 800-160 MG per tablet Take 1 tablet by mouth every Monday, Wednesday, and Friday.   Yes Historical Provider, MD  topiramate (TOPAMAX) 50 MG tablet Take 50 mg by mouth at bedtime.   Yes Historical Provider, MD  traMADol (ULTRAM) 50 MG tablet Take 50-100 mg by mouth every 6 (six) hours as needed for moderate pain.   Yes Historical Provider, MD  traZODone (DESYREL) 50 MG tablet Take 50 mg by mouth at bedtime.   Yes Historical Provider, MD  vitamin B-12 (CYANOCOBALAMIN) 1000 MCG tablet Take 1,000 mcg by mouth daily.   Yes Historical Provider, MD  Vitamin D, Ergocalciferol, (DRISDOL) 50000 units CAPS capsule Take 50,000 Units by mouth every 7 (seven) days. Pt takes on Friday.   Yes Historical Provider, MD    Family History Family History  Problem Relation Age of Onset  . Diabetes Mother   . Heart disease Mother   . Diabetes Father     Social History Social History  Substance Use Topics  . Smoking status: Never Smoker  . Smokeless tobacco: Never Used  . Alcohol use Yes     Comment: occasionally     Allergies   Motrin [ibuprofen] and Penicillins   Review of Systems Review of Systems  All other systems reviewed and are negative.    Physical Exam Updated Vital Signs BP 121/71 (BP Location: Right Arm)   Pulse 72   Temp 100.2 F (37.9 C) (Oral)   Resp 18   Ht 5\' 6"  (1.676 m)   Wt 106.5 kg    SpO2 96%   BMI 37.90 kg/m   Physical Exam  Constitutional: She appears well-developed and well-nourished. No distress.  HENT:  Head: Normocephalic and atraumatic.  Neck: Neck supple.  Cardiovascular: Normal rate and regular rhythm.   Pulmonary/Chest: Effort normal and breath sounds normal. No respiratory distress. She has no wheezes. She has no rales.  Abdominal: Soft. She exhibits no distension. There is no tenderness. There is no rebound and no guarding.  Healing incision in LUQ where new pump was placed.  Induration vs pump palpated under healing incision.  No significant tenderness, no warmth.  Incision is clean, dry,  intact.    Musculoskeletal:  Healed incisions x 3 of lower back.  No erythema, edema, warmth, discharge, or tenderness   Neurological: She is alert.  Skin: She is not diaphoretic.  Oval-shaped light red scaled lesions over anterior chest, tender to palpation. Erythematous patch under skin fold of left breast, nontender, no discharge, fluctuance.  Left lateral thigh with small area of round erythema with mild induration, central scab, no fluctuance.    Psychiatric: She has a normal mood and affect. Her behavior is normal.  Nursing note and vitals reviewed.              ED Treatments / Results  Labs (all labs ordered are listed, but only abnormal results are displayed) Labs Reviewed  BASIC METABOLIC PANEL - Abnormal; Notable for the following:       Result Value   CO2 21 (*)    Creatinine, Ser 1.39 (*)    GFR calc non Af Amer 43 (*)    GFR calc Af Amer 50 (*)    All other components within normal limits  URINALYSIS, ROUTINE W REFLEX MICROSCOPIC (NOT AT [facility]) - Abnormal; Notable for the following:    APPearance CLOUDY (*)    Leukocytes, UA MODERATE (*)    All other components within normal limits  URINE MICROSCOPIC-ADD ON - Abnormal; Notable for the following:    Squamous Epithelial / LPF 6-30 (*)    Bacteria, UA FEW (*)    All other components  within normal limits  CULTURE, BLOOD (ROUTINE X 2)  CULTURE, BLOOD (ROUTINE X 2)  URINE CULTURE  CBC WITH DIFFERENTIAL/PLATELET  COMPREHENSIVE METABOLIC PANEL  CBC  I-STAT CG4 LACTIC ACID, ED    EKG  EKG Interpretation None       Radiology Dg Chest 2 View  Result Date: 07/04/2016 CLINICAL DATA:  Cough.  Fevers.  Rash. EXAM: CHEST  2 VIEW COMPARISON:  06/07/2016 chest radiograph. FINDINGS: Stable cardiomediastinal silhouette with normal heart size. No pneumothorax. No pleural effusion. Lungs appear clear, with no acute consolidative airspace disease and no pulmonary edema. Minimal thoracic spondylosis. Cholecystectomy clips are seen in the right upper quadrant of the abdomen. IMPRESSION: No active cardiopulmonary disease. Electronically Signed   By: Delbert Phenix M.D.   On: 07/04/2016 14:44    Procedures Procedures (including critical care time)  Medications Ordered in ED Medications  vancomycin (VANCOCIN) 1,250 mg in sodium chloride 0.9 % 250 mL IVPB (not administered)  0.9 %  sodium chloride infusion ( Intravenous New Bag/Given 07/04/16 2031)  ondansetron (ZOFRAN) injection 4 mg (not administered)  albuterol (PROVENTIL) (2.5 MG/3ML) 0.083% nebulizer solution 3 mL (not administered)  fludrocortisone (FLORINEF) tablet 0.1 mg (0.1 mg Oral Given 07/04/16 2030)  fluticasone (FLONASE) 50 MCG/ACT nasal spray 2 spray (not administered)  gabapentin (NEURONTIN) capsule 1,600 mg (not administered)  lacosamide (VIMPAT) tablet 100 mg (not administered)  methotrexate chemo injection 25 mg (not administered)  pantoprazole (PROTONIX) EC tablet 40 mg (40 mg Oral Given 07/04/16 2030)  predniSONE (DELTASONE) tablet 5 mg (not administered)  topiramate (TOPAMAX) tablet 50 mg (not administered)  traMADol (ULTRAM) tablet 50-100 mg (100 mg Oral Given 07/04/16 1900)  traZODone (DESYREL) tablet 50 mg (not administered)  vitamin B-12 (CYANOCOBALAMIN) tablet 1,000 mcg (1,000 mcg Oral Given 07/04/16 2034)    Vitamin D (Ergocalciferol) (DRISDOL) capsule 50,000 Units (not administered)  DULoxetine (CYMBALTA) DR capsule 60 mg (not administered)  folic acid (FOLVITE) tablet 1 mg (1 mg Oral Given 07/04/16 2030)  0.9 %  sodium chloride infusion (not administered)  acetaminophen (TYLENOL) tablet 650 mg (650 mg Oral Given 07/04/16 1900)    Or  acetaminophen (TYLENOL) suppository 650 mg ( Rectal See Alternative 07/04/16 1900)  gabapentin (NEURONTIN) capsule 1,200 mg (not administered)  B-complex with vitamin C tablet 1 tablet (1 tablet Oral Given 07/04/16 2030)  sodium chloride 0.9 % bolus 1,000 mL (0 mLs Intravenous Stopped 07/04/16 1616)  ondansetron (ZOFRAN) injection 4 mg (4 mg Intravenous Given 07/04/16 1444)  fentaNYL (SUBLIMAZE) injection 50 mcg (50 mcg Intravenous Given 07/04/16 1445)  vancomycin (VANCOCIN) 1,500 mg in sodium chloride 0.9 % 500 mL IVPB (1,500 mg Intravenous New Bag/Given 07/04/16 1703)     Initial Impression / Assessment and Plan / ED Course  I have reviewed the triage vital signs and the nursing notes.  Pertinent labs & imaging results that were available during my care of the patient were reviewed by me and considered in my medical decision making (see chart for details).  Clinical Course  Value Comment By Time  Culture, blood (single) w Reflex to ID Panel (Reviewed) Trixie Dredge, PA-C 09/12 2039    Pt with hx Wegener's, DM, on chronic prednisone p/w 3 days of fever, painful rash, myalgias, arthralgias.  Hypotensive in ED.   Labs remarkable for slight worsening of renal function.  UA appears contaminated with skin cells.  Pt also seen and examined by Dr Ethelda Chick who spoke with [facility].  Please see his note for further details.  Admitted to [facility], Dr Elisabeth Pigeon accepting.    Final Clinical Impressions(s) / ED Diagnoses   Final diagnoses:  Rash  Hypotension, unspecified hypotension type  History of fever    New Prescriptions Current  Discharge Medication List       Trixie Dredge, Cordelia Poche 07/04/16 2041    Doug Sou, MD 07/05/16 1102

## 2016-07-04 NOTE — Progress Notes (Signed)
Pharmacy Antibiotic Note  [name] is a 50 y.o. female admitted on 07/04/2016.  Pt was sent from PCP office- concern about elevated WBC, rash, intermittent fever. Pt has red blotches on whole body,  low BP. Pt is 5 weeks post op for removal of DRG stimulator.   Pharmacy has been consulted for vancomycin dosing for potential endocarditis.  Plan: Vancomycin 1500mg  IV x 1 then vancomcyin 1250mg  IV q24h Follow renal function, cultures, clinical course vanc trough at steady state  Height: 5\' 6"  (167.6 cm) Weight: 229 lb (103.9 kg) IBW/kg (Calculated) : 59.3  Temp (24hrs), Avg:98.8 F (37.1 C), Min:98.8 F (37.1 C), Max:98.8 F (37.1 C)   Recent Labs Lab 07/04/16 1444 07/04/16 1458  WBC 7.7  --   CREATININE 1.39*  --   LATICACIDVEN  --  0.96    Estimated Creatinine Clearance: 58.9 mL/min (by C-G formula based on SCr of 1.39 mg/dL).    Allergies  Allergen Reactions  . Motrin [Ibuprofen] Other (See Comments)    Pt is unable to take this due to kidney problems.    . Penicillins Rash and Other (See Comments)    Has patient had a PCN reaction causing immediate rash, facial/tongue/throat swelling, SOB or lightheadedness with hypotension: No Has patient had a PCN reaction causing severe rash involving mucus membranes or skin necrosis: No Has patient had a PCN reaction that required hospitalization No Has patient had a PCN reaction occurring within the last 10 years: No If all of the above answers are "NO", then may proceed with Cephalosporin use.    Antimicrobials this admission: 9/12 vancomycin >> Dose adjustments this admission:   Microbiology results: 9/12 BCx:  9/12 UCx:   Thank you for allowing pharmacy to be a part of this patient's care.  Arley PhenixEllen Arneda Sappington RPh 07/04/2016, 4:26 PM Pager [phone]

## 2016-07-04 NOTE — Consult Note (Signed)
[facility]    Date of Admission:  07/04/2016          Reason for Consult:  Rash, fever and hypotension    Referring Physician:  Dr. Remi Haggard  Principal Problem:   Rash Active Problems:   Fever   Hypotension   Acute renal insufficiency   Wegner's disease (congenital syphilitic osteochondritis)   Adrenal insufficiency (HCC)   HTN (hypertension)   OSA (obstructive sleep apnea)   Peripheral neuropathy (HCC)      Recommendations: 1.  Start vancomycin after blood cultures   Assessment:  I'm not sure what is causing her constellation of rash, fever and hypotension but I am concerned about the possibility of infection. 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  I will follow-up tomorrow morning.   HPI: [name] is a 50 y.o. 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  She has a chronic, painful peripheral neuropathy related to prior therapy for Wegener's. She recently had a new pain pump placed in her abdomen.Several weeks ago she developed bronchitis and was started on Omnicef by her family practitioner on 06/21/2016. She completed antibiotic therapy  Last week. Three days ago she began to notice a somewhat painful red rash that has spread and generalized.  One of the larger lesions on her left hip "busted" and drained some pus.  She has been having fever at home to as high as 101.5. She has been dizzy on standing. She had one episode of nausea and vomiting this morning.  She saw her family practitioner again this morning. She was hypotensive and was advised to come to emergency department for further evaluation.   Review of Systems: Review of Systems  Constitutional: Positive for chills, fever and  malaise/fatigue. Negative for diaphoresis and weight loss.  HENT: Positive for sore throat.   Respiratory: Positive for cough. Negative for sputum production and shortness of breath.   Cardiovascular: Negative for chest pain.  Gastrointestinal: Positive for constipation, nausea and vomiting. Negative for abdominal pain and diarrhea.  Genitourinary: Positive for dysuria. Negative for frequency.       Mild chronic dysuria.  Musculoskeletal: Positive for joint pain and myalgias.  Skin: Positive for rash. Negative for itching.  Neurological: Positive for sensory change and headaches. Negative for dizziness.  Psychiatric/Behavioral: Negative for depression and substance abuse. The patient is not nervous/anxious.     Past Medical History:  Diagnosis Date  . Chronic cough   . Diabetes mellitus without complication (HCC)   . Hypertension   . OSA (obstructive sleep apnea)   . Peripheral neuropathy (HCC)   . Wegener's granulomatosis (HCC) 2009  . Wegner's disease (congenital syphilitic osteochondritis)     Social History  Substance Use Topics  . Smoking status: Never Smoker  . Smokeless tobacco: Never Used  . Alcohol use Yes     Comment: occasionally    Family History  Problem Relation Age of Onset  . Diabetes Mother   . Heart disease Mother   . Diabetes Father    Allergies  Allergen Reactions  . Motrin [Ibuprofen] Other (See Comments)    Pt is unable to take this due to kidney problems.    . Penicillins Rash and Other (See  Comments)    Has patient had a PCN reaction causing immediate rash, facial/tongue/throat swelling, SOB or lightheadedness with hypotension: No Has patient had a PCN reaction causing severe rash involving mucus membranes or skin necrosis: No Has patient had a PCN reaction that required hospitalization No Has patient had a PCN reaction occurring within the last 10 years: No If all of the above answers are "NO", then may proceed with Cephalosporin use.     OBJECTIVE: Blood pressure 99/55, pulse 78, temperature 98.8 F (37.1 C), temperature source Oral, resp. rate (!) 28, height 5\' 6"  (1.676 m), weight 229 lb (103.9 kg), SpO2 99 %.  Physical Exam  Constitutional: She is oriented to person, place, and time.  She is alert and sitting up in bed in the emergency department.  HENT:  Mouth/Throat: No oropharyngeal exudate.  Eyes: Conjunctivae are normal.  Neck: Neck supple.  Cardiovascular: Normal rate and regular rhythm.   No murmur heard. Pulmonary/Chest: Effort normal and breath sounds normal. She has no wheezes. She has no rales.  Frequent loose cough during the exam.  Abdominal: Soft. There is no tenderness.  Healing surgical incision from recent pain pump placement in left upper quadrant.  Musculoskeletal: Normal range of motion. She exhibits no edema or tenderness.  Neurological: She is alert and oriented to person, place, and time.  Skin:  She has a diffuse erythematous, maculopapular rash. I cannot determine if these lesions are follicular based. The lesions are on her face chest abdomen and legs. She has one large lesion on her left hip which has central ulceration. There is no drainage.  Psychiatric: Mood and affect normal.    Lab Results Lab Results  Component Value Date   WBC 7.7 07/04/2016   HGB 14.4 07/04/2016   HCT 44.3 07/04/2016   MCV 91.7 07/04/2016   PLT 303 07/04/2016    Lab Results  Component Value Date   CREATININE 1.39 (H) 07/04/2016   BUN 15 07/04/2016   NA 135 07/04/2016   K 3.7 07/04/2016   CL 106 07/04/2016   CO2 21 (L) 07/04/2016    Lab Results  Component Value Date   ALT 21 05/30/2013   AST 20 05/30/2013   ALKPHOS 128 (H) 05/30/2013   BILITOT 0.2 (L) 05/30/2013     Microbiology: No results found for this or any previous visit (from the past 240 hour(s)).  Cliffton AstersJohn Laksh Hinners, MD [facility] [phone] pager   [phone] 07/04/2016, 5:09  PM

## 2016-07-04 NOTE — ED Notes (Signed)
PA at bedside.

## 2016-07-04 NOTE — ED Triage Notes (Signed)
Pt was sent from PCP office-Dr. Fannie Knee. [place]. PCP has concern about elevated WBC, rash, intermittent fever. Pt has red blotches on whole body.Also low BP. Pt is 5 weeks post op for removal of DRG stimulator.

## 2016-07-04 NOTE — ED Provider Notes (Signed)
Presents with diffuse rash which is painful onset 3 days ago, accompanied by fever temperature 101 yesterday. Last treated with Tylenol yesterday. Also complains of mild headache and diffuse myalgias. On exam alert, nontoxic HEENT exam no facial asymmetry neck supple no signs of meningitis lungs clear auscultation heart regular rate and rhythm abdomen obese, nontender all 4 extremities neurovascular intact skin there is a pinkish papular rash diffusely on trunk and abdomen extremities and face, mildly tender. She has a single lesion on left thigh which is 2 cm diameter which appears to be an abscess spontaneously drained. Case discussed with Dr. Elisabeth Pigeonevine who requests infectious disease consult. I consulted Dr. Orvan Falconerampbell from infectious disease who will evaluate patient in the ED. Dr. Orvan Falconerampbell evaluated patient in the ED and suggested admission, blood cultures, intravenous vancomycin. Dr. Elisabeth Pigeonevine consulted and will arrange for hospital admission   Doug SouSam Naphtali Zywicki, MD 07/04/16 1724

## 2016-07-04 NOTE — Progress Notes (Signed)
Spoke with Dr Ethelda ChickJacubowitz who is consulting Infectious disease to evaluate pt

## 2016-07-04 NOTE — ED Notes (Signed)
Hospitalist at bedside 

## 2016-07-04 NOTE — ED Notes (Signed)
Patient transported to X-ray 

## 2016-07-04 NOTE — Progress Notes (Signed)
ED CM consulted by Dr Elisabeth Pigeonevine after she states she spoke with EDP, Jacubowitz Reviewed clinicals in ED summary encounter with Dr Elisabeth Pigeonevine

## 2016-07-05 DIAGNOSIS — G4733 Obstructive sleep apnea (adult) (pediatric): Secondary | ICD-10-CM

## 2016-07-05 DIAGNOSIS — R21 Rash and other nonspecific skin eruption: Secondary | ICD-10-CM | POA: Diagnosis not present

## 2016-07-05 DIAGNOSIS — R509 Fever, unspecified: Secondary | ICD-10-CM | POA: Diagnosis not present

## 2016-07-05 LAB — COMPREHENSIVE METABOLIC PANEL
ALBUMIN: 3 g/dL — AB (ref 3.5–5.0)
ALK PHOS: 104 U/L (ref 38–126)
ALT: 30 U/L (ref 14–54)
ANION GAP: 6 (ref 5–15)
AST: 41 U/L (ref 15–41)
BUN: 10 mg/dL (ref 6–20)
CALCIUM: 8.3 mg/dL — AB (ref 8.9–10.3)
CHLORIDE: 113 mmol/L — AB (ref 101–111)
CO2: 21 mmol/L — AB (ref 22–32)
Creatinine, Ser: 1.09 mg/dL — ABNORMAL HIGH (ref 0.44–1.00)
GFR calc non Af Amer: 58 mL/min — ABNORMAL LOW (ref >60)
GLUCOSE: 100 mg/dL — AB (ref 65–99)
POTASSIUM: 4.2 mmol/L (ref 3.5–5.1)
SODIUM: 140 mmol/L (ref 135–145)
Total Bilirubin: 0.2 mg/dL — ABNORMAL LOW (ref 0.3–1.2)
Total Protein: 6 g/dL — ABNORMAL LOW (ref 6.5–8.1)

## 2016-07-05 LAB — CBC
HEMATOCRIT: 38.8 % (ref 36.0–46.0)
HEMOGLOBIN: 12.5 g/dL (ref 12.0–15.0)
MCH: 29.9 pg (ref 26.0–34.0)
MCHC: 32.2 g/dL (ref 30.0–36.0)
MCV: 92.8 fL (ref 78.0–100.0)
Platelets: 245 10*3/uL (ref 150–400)
RBC: 4.18 MIL/uL (ref 3.87–5.11)
RDW: 15.3 % (ref 11.5–15.5)
WBC: 6.6 10*3/uL (ref 4.0–10.5)

## 2016-07-05 LAB — GLUCOSE, CAPILLARY: Glucose-Capillary: 117 mg/dL — ABNORMAL HIGH (ref 65–99)

## 2016-07-05 MED ORDER — VANCOMYCIN HCL IN DEXTROSE 750-5 MG/150ML-% IV SOLN
750.0000 mg | Freq: Two times a day (BID) | INTRAVENOUS | Status: DC
  Administered 2016-07-05 – 2016-07-06 (×3): 750 mg via INTRAVENOUS
  Filled 2016-07-05 (×4): qty 150

## 2016-07-05 NOTE — Progress Notes (Signed)
PROGRESS NOTE    Stacey CromerLisa [name]  HQI:[id number]:[id number]  DOB: May 01, 1966  DOA: 07/04/2016 PCP: No PCP Per Patient  Hospital course: Stacey CromerLisa [name] is a 50 y.o. 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 She has been seen by primary care physician who referred her to emergency room for evaluation. Patient reported that rash started initially on lower extremities and then spread to her trunk. She reports no exposure to ticks. No reports of IV drug abuse. She did not know what the maximum temperature was but her fever has been intermittent and worse over past 24 hours. No reports of chest pain or shortness of breath. No cough. No abdominal pain, nausea, vomiting or diarrhea.  Assessment & Plan:     Fever / Generalized maculopapular rash / Hypotension - Patient presented with fever, generalized painful rash and hypotension. She she has required IV fluids on the admission which has temporarily normalize her blood pressure to 108/73 but her current BP again 91/57. Please note that the patient has history of adrenal insufficiency and is on Florinef as well as low dose prednisone - Presentation with fever and generalized rash is worrisome for acute infectious process, possible MRSA  - Seen by infectious disease who recommended vancomycin - Follow-up blood culture results: No growth to date    Wegner's disease (congenital syphilitic osteochondritis) - Continue low-dose prednisone  Small abscess left side  -feels fluctuant and tender with erythema - may need I&D, will see what ID team thinks about this    Adrenal insufficiency (HCC) - Continue Florinef, Methotrexate  Chronic Pain - Pt has implanted pain pump for intrathecal prialt administration (placed Aug 2017) by  Dr. Elberta LeatherwoodQadri ([facility])    OSA (obstructive sleep apnea) - Stable respiratory status    Peripheral neuropathy (HCC) - Continue gabapentin    Essential hypertension - Blood pressure medications on hold for now    CKD (chronic kidney disease) stage 3, GFR 30-59 ml/min - Baseline creatinine about 3 years ago is 1.14 - Creatinine on this admission is 1.3. We will hold off on Lasix as this could have contributed to worsening renal function   DVT prophylaxis: SCD's bilaterally  Code Status: full code  Family Communication: husband at the bedside   Disposition Plan: medical floor  Consults called: ID, Dr. Cliffton Astersampbell John Admission status: inpatient admission. Patient was hypotensive on the admission. She reported intermittent fevers and painful rash for last 4 days prior to this admission. She has also seen primary care physician who referred the patient for further evaluation to ED. Patient requires inpatient workup and treatment tincluding blood cultures, vancomycin. I anticipate she will require at least 2-3 days in the hospital specifically the time length for blood culture results. We consulted infectious disease who also recognizes the importance of admission and inpatient evaluation.   Antimicrobials: Anti-infectives    Start     Dose/Rate Route Frequency Ordered Stop   07/05/16 1600  vancomycin (VANCOCIN) 1,250 mg in sodium chloride 0.9 % 250 mL IVPB  Status:  Discontinued     1,250 mg 166.7 mL/hr over 90 Minutes Intravenous Every 24 hours 07/04/16 1627 07/05/16 0828   07/05/16 0900  vancomycin (VANCOCIN) IVPB 750 mg/150 ml premix     750 mg 150 mL/hr over 60 Minutes Intravenous Every 12 hours 07/05/16 0828  07/04/16 1630  vancomycin (VANCOCIN) 1,500 mg in sodium chloride 0.9 % 500 mL IVPB     1,500 mg 250 mL/hr over 120 Minutes Intravenous  Once 07/04/16 1618 07/04/16 1903         Subjective: Pt feels unchanged. Still having low grade fever.  Rash persists.  Carbuncle left  hip is painful.   Objective: Vitals:   07/04/16 1800 07/04/16 1838 07/04/16 2043 07/05/16 0518  BP: 113/77 121/71 101/63 122/77  Pulse: 77 72 78 76  Resp: 26 18 18 20   Temp:  100.2 F (37.9 C) 99.4 F (37.4 C) (!) 100.9 F (38.3 C)  TempSrc:  Oral Oral Oral  SpO2: 96% 96% 95% 91%  Weight:  106.5 kg (234 lb 12.6 oz)    Height:  5\' 6"  (1.676 m)      Intake/Output Summary (Last 24 hours) at 07/05/16 1209 Last data filed at 07/05/16 0800  Gross per 24 hour  Intake           1252.5 ml  Output                0 ml  Net           1252.5 ml   Filed Weights   07/04/16 1143 07/04/16 1234 07/04/16 1838  Weight: 103.9 kg (229 lb) 103.9 kg (229 lb) 106.5 kg (234 lb 12.6 oz)   Exam:  Eyes: PERRL, lids and conjunctivae normal ENMT: Mucous membranes are moist. Posterior pharynx clear of any exudate or lesions.Normal dentition.  Neck: normal, supple, no masses, no thyromegaly Respiratory: clear to auscultation bilaterally, no wheezing, no crackles. Normal respiratory effort. No accessory muscle use.  Cardiovascular: Regular rate and rhythm, no murmurs / rubs / gallops. No extremity edema. 2+ pedal pulses. No carotid bruits.  Abdomen: no tenderness, no masses palpated. No hepatosplenomegaly. Bowel sounds positive.  Musculoskeletal: no clubbing / cyanosis. No joint deformity upper and lower extremities. Good ROM, no contractures. Normal muscle tone.  Skin: sporadic macular rash over trunk, lower extremities. Skin is warm and dry. Small abscess 4 cm diameter fluctuant left side near hip erythematous, central scab noted, tender to palpation Neurologic: CN 2-12 grossly intact. Sensation intact, DTR normal. Strength 5/5 in all 4.  Psychiatric: Normal judgment and insight. Alert and oriented x 3. Normal mood.   Data Reviewed: Basic Metabolic Panel:  Recent Labs Lab 07/04/16 1444 07/05/16 0606  NA 135 140  K 3.7 4.2  CL 106 113*  CO2 21* 21*  GLUCOSE 90 100*  BUN 15 10  CREATININE 1.39*  1.09*  CALCIUM 8.9 8.3*   Liver Function Tests:  Recent Labs Lab 07/05/16 0606  AST 41  ALT 30  ALKPHOS 104  BILITOT 0.2*  PROT 6.0*  ALBUMIN 3.0*   No results for input(s): LIPASE, AMYLASE in the last 168 hours. No results for input(s): AMMONIA in the last 168 hours. CBC:  Recent Labs Lab 07/04/16 1444 07/05/16 0606  WBC 7.7 6.6  NEUTROABS 3.6  --   HGB 14.4 12.5  HCT 44.3 38.8  MCV 91.7 92.8  PLT 303 245   Cardiac Enzymes: No results for input(s): CKTOTAL, CKMB, CKMBINDEX, TROPONINI in the last 168 hours. CBG (last 3)   Recent Labs  07/05/16 0743  GLUCAP 117*   Recent Results (from the past 240 hour(s))  Blood culture (routine x 2)     Status: None (Preliminary result)   Collection Time: 07/04/16  2:39 PM  Result Value Ref Range Status  Specimen Description BLOOD LEFT HAND  Final   Special Requests   Final    BOTTLES DRAWN AEROBIC ONLY 5 CC Performed at [facility]    Culture PENDING  Incomplete   Report Status PENDING  Incomplete     Studies: Dg Chest 2 View  Result Date: 07/04/2016 CLINICAL DATA:  Cough.  Fevers.  Rash. EXAM: CHEST  2 VIEW COMPARISON:  06/07/2016 chest radiograph. FINDINGS: Stable cardiomediastinal silhouette with normal heart size. No pneumothorax. No pleural effusion. Lungs appear clear, with no acute consolidative airspace disease and no pulmonary edema. Minimal thoracic spondylosis. Cholecystectomy clips are seen in the right upper quadrant of the abdomen. IMPRESSION: No active cardiopulmonary disease. Electronically Signed   By: Delbert Phenix M.D.   On: 07/04/2016 14:44     Scheduled Meds: . B-complex with vitamin C  1 tablet Oral Daily  . DULoxetine  60 mg Oral BID  . fludrocortisone  0.1 mg Oral Daily  . folic acid  1 mg Oral Daily  . gabapentin  1,200 mg Oral Daily  . gabapentin  1,600 mg Oral QHS  . lacosamide  100 mg Oral BID  . [START ON 07/07/2016] methotrexate  25 mg Intravenous Weekly  . pantoprazole  40 mg  Oral Daily  . predniSONE  5 mg Oral Daily  . topiramate  50 mg Oral QHS  . traZODone  50 mg Oral QHS  . vancomycin  750 mg Intravenous Q12H  . vitamin B-12  1,000 mcg Oral Daily  . [START ON 07/07/2016] Vitamin D (Ergocalciferol)  50,000 Units Oral Q7 days   Continuous Infusions: . sodium chloride 75 mL/hr at 07/05/16 1610   Principal Problem:   Fever Active Problems:   Wegner's disease (congenital syphilitic osteochondritis)   Adrenal insufficiency (HCC)   OSA (obstructive sleep apnea)   Peripheral neuropathy (HCC)   Hypotension   Rash   CKD (chronic kidney disease) stage 3, GFR 30-59 ml/min  Time spent:   Standley Dakins, MD, FAAFP [facility] Pager [phone]  If 7PM-7AM, please contact night-coverage [email or web address] Password [email or web address] 07/05/2016, 12:09 PM    LOS: 1 day

## 2016-07-05 NOTE — Progress Notes (Signed)
[id number]/Aryeh Butterfield,BSN,RN3,CCM:  Aged to Dr. Laural BenesJohnson for status change from inpatient to observation per interqual glines.

## 2016-07-05 NOTE — Progress Notes (Signed)
Pharmacy Antibiotic Note  Stacey CromerLisa [name] is a 50 y.o. female admitted on 07/04/2016.  Pt was sent from PCP office d/t concern about elevated WBC, rash, intermittent fever, hypotension.  ID consulted for hx of recurrent Staph aureus infection, recent pain pump placement, recent Omnicef use.  Pharmacy has been consulted for vancomycin dosing for potential endocarditis.  Plan:  Vancomycin 1500mg  IV x 1 then 750 mg IV q12h  Measure Vanc trough at steady state.  Follow up renal fxn, culture results, and clinical course.   Height: 5\' 6"  (167.6 cm) Weight: 234 lb 12.6 oz (106.5 kg) IBW/kg (Calculated) : 59.3  Temp (24hrs), Avg:99.8 F (37.7 C), Min:98.8 F (37.1 C), Max:100.9 F (38.3 C)   Recent Labs Lab 07/04/16 1444 07/04/16 1458 07/05/16 0606  WBC 7.7  --  6.6  CREATININE 1.39*  --  1.09*  LATICACIDVEN  --  0.96  --     Estimated Creatinine Clearance: 76.2 mL/min (by C-G formula based on SCr of 1.09 mg/dL (H)).    Allergies  Allergen Reactions  . Motrin [Ibuprofen] Other (See Comments)    Pt is unable to take this due to kidney problems.    . Penicillins Rash and Other (See Comments)    Has patient had a PCN reaction causing immediate rash, facial/tongue/throat swelling, SOB or lightheadedness with hypotension: No Has patient had a PCN reaction causing severe rash involving mucus membranes or skin necrosis: No Has patient had a PCN reaction that required hospitalization No Has patient had a PCN reaction occurring within the last 10 years: No If all of the above answers are "NO", then may proceed with Cephalosporin use.    Antimicrobials this admission: 9/12 vancomycin >>  Dose adjustments this admission: 9/13 Vancomycin dose adjusted for decreased SCr  Microbiology results: 9/12 BCx:  9/12 UCx:   Thank you for allowing pharmacy to be a part of this patient's care.  [name] PharmD, BCPS Pager [phone] 07/05/2016 8:18 AM

## 2016-07-05 NOTE — Progress Notes (Signed)
[facility] for Infectious Disease    Date of Admission:  07/04/2016           Day 1 vancomycin  Principal Problem:   Fever Active Problems:   Hypotension   Rash   Wegner's disease (congenital syphilitic osteochondritis)   Adrenal insufficiency (HCC)   OSA (obstructive sleep apnea)   Peripheral neuropathy (HCC)   CKD (chronic kidney disease) stage 3, GFR 30-59 ml/min   . B-complex with vitamin C  1 tablet Oral Daily  . DULoxetine  60 mg Oral BID  . fludrocortisone  0.1 mg Oral Daily  . folic acid  1 mg Oral Daily  . gabapentin  1,200 mg Oral Daily  . gabapentin  1,600 mg Oral QHS  . lacosamide  100 mg Oral BID  . [START ON 07/07/2016] methotrexate  25 mg Intravenous Weekly  . pantoprazole  40 mg Oral Daily  . predniSONE  5 mg Oral Daily  . topiramate  50 mg Oral QHS  . traZODone  50 mg Oral QHS  . vancomycin  750 mg Intravenous Q12H  . vitamin B-12  1,000 mcg Oral Daily  . [START ON 07/07/2016] Vitamin D (Ergocalciferol)  50,000 Units Oral Q7 days    SUBJECTIVE: She is feeling about the same. She has noted some new skin lesions with some lesions on her palms and soles now. She is also having a little bit of irritation of her left eye. She continues to have myalgias and arthralgias.  Review of Systems: Review of Systems  Constitutional: Positive for chills, fever and malaise/fatigue. Negative for diaphoresis and weight loss.  HENT: Negative for sore throat.   Respiratory: Positive for cough. Negative for sputum production and shortness of breath.   Cardiovascular: Negative for chest pain.  Gastrointestinal: Negative for abdominal pain, diarrhea, nausea and vomiting.  Genitourinary: Negative for dysuria.  Musculoskeletal: Positive for joint pain and myalgias.  Skin: Positive for rash. Negative for itching.  Neurological: Positive for headaches.    Past Medical History:  Diagnosis Date  . Chronic cough   . Diabetes mellitus without complication ([place])     . Hypertension   . OSA (obstructive sleep apnea)   . Peripheral neuropathy ([place])   . Wegener's granulomatosis ([place]) 2009  . Wegner's disease (congenital syphilitic osteochondritis)     Social History  Substance Use Topics  . Smoking status: Never Smoker  . Smokeless tobacco: Never Used  . Alcohol use Yes     Comment: occasionally    Family History  Problem Relation Age of Onset  . Diabetes Mother   . Heart disease Mother   . Diabetes Father    Allergies  Allergen Reactions  . Motrin [Ibuprofen] Other (See Comments)    Pt is unable to take this due to kidney problems.    . Penicillins Rash and Other (See Comments)    Has patient had a PCN reaction causing immediate rash, facial/tongue/throat swelling, SOB or lightheadedness with hypotension: No Has patient had a PCN reaction causing severe rash involving mucus membranes or skin necrosis: No Has patient had a PCN reaction that required hospitalization No Has patient had a PCN reaction occurring within the last 10 years: No If all of the above answers are "NO", then may proceed with Cephalosporin use.    OBJECTIVE: Vitals:   07/04/16 1800 07/04/16 1838 07/04/16 2043 07/05/16 0518  BP: 113/77 121/71 101/63 122/77  Pulse: 77 72 78 76  Resp: 26 18  18 20  Temp:  100.2 F (37.9 C) 99.4 F (37.4 C) (!) 100.9 F (38.3 C)  TempSrc:  Oral Oral Oral  SpO2: 96% 96% 95% 91%  Weight:  234 lb 12.6 oz (106.5 kg)    Height:  5\' 6"  (1.676 m)     Body mass index is 37.9 kg/m.  Physical Exam  Constitutional: She is oriented to person, place, and time.  She is alert and resting quietly in bed. She is covered in blankets. She appears somewhat uncomfortable.  HENT:  Mouth/Throat: No oropharyngeal exudate.  Eyes:  She has some new, slight conjunctival redness on the left.  Cardiovascular: Normal rate and regular rhythm.   No murmur heard. Pulmonary/Chest: Effort normal and breath sounds normal. She has no wheezes. She has no  rales.  Abdominal: Soft. There is no tenderness.  Musculoskeletal: Normal range of motion. She exhibits no edema or tenderness.  Neurological: She is alert and oriented to person, place, and time.  Skin: Rash noted. There is erythema.  Her existing skin lesions are all clean and somewhat larger and more red. She has 1 lesion on her left palm and 2 new lesions on her right foot.  Psychiatric: Mood and affect normal.    Lab Results Lab Results  Component Value Date   WBC 6.6 07/05/2016   HGB 12.5 07/05/2016   HCT 38.8 07/05/2016   MCV 92.8 07/05/2016   PLT 245 07/05/2016    Lab Results  Component Value Date   CREATININE 1.09 (H) 07/05/2016   BUN 10 07/05/2016   NA 140 07/05/2016   K 4.2 07/05/2016   CL 113 (H) 07/05/2016   CO2 21 (L) 07/05/2016    Lab Results  Component Value Date   ALT 30 07/05/2016   AST 41 07/05/2016   ALKPHOS 104 07/05/2016   BILITOT 0.2 (L) 07/05/2016     Microbiology: Recent Results (from the past 240 hour(s))  Blood culture (routine x 2)     Status: None (Preliminary result)   Collection Time: 07/04/16  2:39 PM  Result Value Ref Range Status   Specimen Description BLOOD LEFT HAND  Final   Special Requests   Final    BOTTLES DRAWN AEROBIC ONLY 5 CC Performed at [facility]    Culture PENDING  Incomplete   Report Status PENDING  Incomplete     ASSESSMENT: Her hypotension has resolved but she continues to have some fevers and rash. Blood cultures are negative so far. I wonder if she may have a variant of erythema multiforme. She was recently treated for bronchitis and may have had mycoplasma infection. EM can be triggered by multiple infections and drugs. She just recently stopped her antibiotic for the bronchitis. If blood cultures remain negative I will consider stopping vancomycin tomorrow.  PLAN: 1. Continue vancomycin for now  Michel Bickers, MD [facility] for Infectious [place] [phone] pager    [phone] cell 07/05/2016, 1:47 PMPatient ID: [name], female   DOB: 01/02/1966, 50 y.o.   MRN: [id number]

## 2016-07-06 DIAGNOSIS — Z978 Presence of other specified devices: Secondary | ICD-10-CM

## 2016-07-06 LAB — GLUCOSE, CAPILLARY: Glucose-Capillary: 131 mg/dL — ABNORMAL HIGH (ref 65–99)

## 2016-07-06 LAB — CRYPTOCOCCAL ANTIGEN: Crypto Ag: NEGATIVE

## 2016-07-06 LAB — URINE CULTURE

## 2016-07-06 LAB — MRSA PCR SCREENING: MRSA BY PCR: NEGATIVE

## 2016-07-06 MED ORDER — CHLORHEXIDINE GLUCONATE CLOTH 2 % EX PADS
6.0000 | MEDICATED_PAD | Freq: Once | CUTANEOUS | Status: AC
  Administered 2016-07-07: 6 via TOPICAL

## 2016-07-06 MED ORDER — SODIUM CHLORIDE 0.9 % IV BOLUS (SEPSIS)
500.0000 mL | Freq: Once | INTRAVENOUS | Status: AC
  Administered 2016-07-06: 500 mL via INTRAVENOUS

## 2016-07-06 MED ORDER — CHLORHEXIDINE GLUCONATE CLOTH 2 % EX PADS
6.0000 | MEDICATED_PAD | Freq: Once | CUTANEOUS | Status: AC
  Administered 2016-07-06: 6 via TOPICAL

## 2016-07-06 NOTE — Anesthesia Preprocedure Evaluation (Addendum)
Anesthesia Evaluation  Patient identified by MRN, date of birth, ID band Patient awake    Reviewed: Allergy & Precautions, NPO status , Patient's Chart, lab work & pertinent test results  History of Anesthesia Complications Negative for: history of anesthetic complications  Airway Mallampati: II  TM Distance: >3 FB Neck ROM: Full    Dental no notable dental hx. (+) Dental Advisory Given   Pulmonary sleep apnea ,  weagners   Pulmonary exam normal breath sounds clear to auscultation       Cardiovascular hypertension, negative cardio ROS Normal cardiovascular exam Rhythm:Regular Rate:Normal     Neuro/Psych  Neuromuscular disease (peripheral neuropathy) negative psych ROS   GI/Hepatic negative GI ROS, Neg liver ROS,   Endo/Other  diabetesobesity  Renal/GU Renal disease  negative genitourinary   Musculoskeletal  (+) Arthritis ,   Abdominal   Peds negative pediatric ROS (+)  Hematology negative hematology ROS (+)   Anesthesia Other Findings   Reproductive/Obstetrics negative OB ROS                            Anesthesia Physical Anesthesia Plan  ASA: III  Anesthesia Plan: MAC   Post-op Pain Management:    Induction: Intravenous  Airway Management Planned: Nasal Cannula  Additional Equipment:   Intra-op Plan:   Post-operative Plan:   Informed Consent: I have reviewed the patients History and Physical, chart, labs and discussed the procedure including the risks, benefits and alternatives for the proposed anesthesia with the patient or authorized representative who has indicated his/her understanding and acceptance.   Dental advisory given  Plan Discussed with: CRNA  Anesthesia Plan Comments:        Anesthesia Quick Evaluation

## 2016-07-06 NOTE — Progress Notes (Signed)
PROGRESS NOTE   [name]  WUJ:[id number]  DOB: 29-Dec-1965  DOA: 07/04/2016 PCP: No PCP Per Patient  Hospital course: Stacey CromerLisa [name] is a 50 y.o. 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 She has been seen by primary care physician who referred her to emergency room for evaluation. Patient reported that rash started initially on lower extremities and then spread to her trunk. She reports no exposure to ticks. No reports of IV drug abuse. She did not know what the maximum temperature was but her fever has been intermittent and worse over past 24 hours. No reports of chest pain or shortness of breath. No cough.  No abdominal pain, nausea, vomiting or diarrhea.  Assessment & Plan:     Fever / Generalized maculopapular rash / Hypotension - Patient presented with fever, generalized painful rash and hypotension. She she has required IV fluids on the admission which has temporarily normalize her blood pressure to 108/73 but her current BP again 91/57.  Patient has history of adrenal insufficiency and is on Florinef as well as low dose prednisone, which will continue. - Presentation with fever and generalized rash is worrisome for acute infectious process, possible MRSA  - Seen by infectious disease who recommended vancomycin - Follow-up blood culture results: No growth to date - Urine culture: enterococcus positive - Hypotension - resolved now. - will ask general surgery for skin biopsy.      Wegner's disease (congenital syphilitic osteochondritis) - Continue low-dose prednisone  Small abscess left side  -feels fluctuant and tender with erythema - improving with vancomycin - requesting general surgery consult to evaluate     Adrenal insufficiency  (HCC) - Continue Florinef, Methotrexate  Enterococcus UTI  Pt currently on vancomycin, await further recs from ID  Chronic Pain - Pt has implanted pain pump for intrathecal prialt administration (placed Aug 2017) by Dr. Elberta [name] ([facility]) - I spoke with Dr [name] 9/14. He suggested considering MRI L spine and/or LP.  He is available to be contacted by cell phone [phone]    OSA (obstructive sleep apnea) - Stable respiratory status    Peripheral neuropathy (HCC) - Continue gabapentin    Essential hypertension - Blood pressure medications on hold for now    CKD (chronic kidney disease) stage 3, GFR 30-59 ml/min - Baseline creatinine about 3 years ago is 1.14 - Creatinine on this admission is 1.3.  We will hold off on Lasix as this could have contributed to worsening renal function   DVT prophylaxis: SCDs bilaterally  Code Status: full code  Family Communication: husband at the bedside   Disposition Plan: medical floor  Consults called: ID, Dr. Cliffton Astersampbell [name], general surgery called 9/14 Admission status: inpatient admission. Patient was hypotensive on the admission. She reported intermittent fevers and painful rash for last 4 days prior to this admission. She has also seen primary care physician who referred the patient for further evaluation to ED. Patient requires inpatient workup and treatment tincluding blood cultures, vancomycin. I anticipate she will require at least 2-3 days in the hospital specifically the time length for blood culture results. We consulted infectious disease who also recognizes the importance of admission and inpatient evaluation.   Antimicrobials: Anti-infectives    Start     Dose/Rate Route Frequency Ordered Stop   07/05/16 1600  vancomycin (VANCOCIN)  1,250 mg in sodium chloride 0.9 % 250 mL IVPB  Status:  Discontinued     1,250 mg 166.7 mL/hr over 90 Minutes Intravenous Every 24 hours 07/04/16 1627 07/05/16 0828   07/05/16 0900  vancomycin (VANCOCIN)  IVPB 750 mg/150 ml premix     750 mg 150 mL/hr over 60 Minutes Intravenous Every 12 hours 07/05/16 0828     07/04/16 1630  vancomycin (VANCOCIN) 1,500 mg in sodium chloride 0.9 % 500 mL IVPB     1,500 mg 250 mL/hr over 120 Minutes Intravenous  Once 07/04/16 1618 07/04/16 1903        Subjective: Pt says rash persists and having higher fevers. Carbuncle left hip is painful but much less fluctuant.   Objective: Vitals:   07/05/16 2125 07/06/16 0559 07/06/16 0853 07/06/16 1028  BP: 110/71 133/90    Pulse: 80 90    Resp: 18 20    Temp: (!) 100.5 F (38.1 C) (!) 102.4 F (39.1 C) (!) 101.9 F (38.8 C) (!) 102.1 F (38.9 C)  TempSrc: Oral Oral Oral Oral  SpO2: 96% 100%    Weight:      Height:        Intake/Output Summary (Last 24 hours) at 07/06/16 1130 Last data filed at 07/06/16 0800  Gross per 24 hour  Intake          2553.75 ml  Output                2 ml  Net          2551.75 ml   Filed Weights   07/04/16 1143 07/04/16 1234 07/04/16 1838  Weight: 103.9 kg (229 lb) 103.9 kg (229 lb) 106.5 kg (234 lb 12.6 oz)   Exam:  Eyes: PERRL, lids and conjunctivae normal ENMT: Mucous membranes are moist. Posterior pharynx clear of any exudate or lesions.Normal dentition.  Neck: normal, supple, no masses, no thyromegaly Respiratory: clear to auscultation bilaterally, no wheezing, no crackles. Normal respiratory effort. No accessory muscle use.  Cardiovascular: Regular rate and rhythm, no murmurs / rubs / gallops. No extremity edema. 2+ pedal pulses. No carotid bruits.  Abdomen: no tenderness, no masses palpated. No hepatosplenomegaly. Bowel sounds positive.  Musculoskeletal: no clubbing / cyanosis. No joint deformity upper and lower extremities. Good ROM, no contractures. Normal muscle tone.  Skin: sporadic macular rash over trunk, lower extremities. Skin is warm and dry. Small abscess 4 cm diameter fluctuant left side near hip erythematous, central scab noted, tender to  palpation Neurologic: CN 2-12 grossly intact. Sensation intact, DTR normal. Strength 5/5 in all 4.  Psychiatric: Normal judgment and insight. Alert and oriented x 3. Normal mood.   Data Reviewed: Basic Metabolic Panel:  Recent Labs Lab 07/04/16 1444 07/05/16 0606  NA 135 140  K 3.7 4.2  CL 106 113*  CO2 21* 21*  GLUCOSE 90 100*  BUN 15 10  CREATININE 1.39* 1.09*  CALCIUM 8.9 8.3*   Liver Function Tests:  Recent Labs Lab 07/05/16 0606  AST 41  ALT 30  ALKPHOS 104  BILITOT 0.2*  PROT 6.0*  ALBUMIN 3.0*   No results for input(s): LIPASE, AMYLASE in the last 168 hours. No results for input(s): AMMONIA in the last 168 hours. CBC:  Recent Labs Lab 07/04/16 1444 07/05/16 0606  WBC 7.7 6.6  NEUTROABS 3.6  --   HGB 14.4 12.5  HCT 44.3 38.8  MCV 91.7 92.8  PLT 303 245   Cardiac Enzymes: No results for input(s):  CKTOTAL, CKMB, CKMBINDEX, TROPONINI in the last 168 hours. CBG (last 3)   Recent Labs  07/05/16 0743 07/06/16 0746  GLUCAP 117* 131*   Recent Results (from the past 240 hour(s))  Urine culture     Status: Abnormal   Collection Time: 07/04/16  2:00 PM  Result Value Ref Range Status   Specimen Description URINE, CLEAN CATCH  Final   Special Requests NONE  Final   Culture 60,000 COLONIES/mL ENTEROCOCCUS FAECALIS (A)  Final   Report Status 07/06/2016 FINAL  Final   Organism ID, Bacteria ENTEROCOCCUS FAECALIS (A)  Final      Susceptibility   Enterococcus faecalis - MIC*    AMPICILLIN <=2 SENSITIVE Sensitive     LEVOFLOXACIN 0.5 SENSITIVE Sensitive     NITROFURANTOIN <=16 SENSITIVE Sensitive     VANCOMYCIN 1 SENSITIVE Sensitive     * 60,000 COLONIES/mL ENTEROCOCCUS FAECALIS  Blood culture (routine x 2)     Status: None (Preliminary result)   Collection Time: 07/04/16  2:39 PM  Result Value Ref Range Status   Specimen Description BLOOD LEFT HAND  Final   Special Requests BOTTLES DRAWN AEROBIC ONLY 5 CC  Final   Culture   Final    NO GROWTH < 24  HOURS Performed at [facility]    Report Status PENDING  Incomplete  Blood culture (routine x 2)     Status: None (Preliminary result)   Collection Time: 07/04/16  2:54 PM  Result Value Ref Range Status   Specimen Description BLOOD RIGHT ANTECUBITAL  Final   Special Requests BOTTLES DRAWN AEROBIC AND ANAEROBIC 5 CC EA  Final   Culture   Final    NO GROWTH < 24 HOURS Performed at [facility]    Report Status PENDING  Incomplete    Studies: Dg Chest 2 View  Result Date: 07/04/2016 CLINICAL DATA:  Cough.  Fevers.  Rash. EXAM: CHEST  2 VIEW COMPARISON:  06/07/2016 chest radiograph. FINDINGS: Stable cardiomediastinal silhouette with normal heart size. No pneumothorax. No pleural effusion. Lungs appear clear, with no acute consolidative airspace disease and no pulmonary edema. Minimal thoracic spondylosis. Cholecystectomy clips are seen in the right upper quadrant of the abdomen. IMPRESSION: No active cardiopulmonary disease. Electronically Signed   By: Delbert Phenix M.D.   On: 07/04/2016 14:44   Scheduled Meds: . B-complex with vitamin C  1 tablet Oral Daily  . DULoxetine  60 mg Oral BID  . fludrocortisone  0.1 mg Oral Daily  . folic acid  1 mg Oral Daily  . gabapentin  1,200 mg Oral Daily  . gabapentin  1,600 mg Oral QHS  . lacosamide  100 mg Oral BID  . pantoprazole  40 mg Oral Daily  . predniSONE  5 mg Oral Daily  . topiramate  50 mg Oral QHS  . traZODone  50 mg Oral QHS  . vancomycin  750 mg Intravenous Q12H  . vitamin B-12  1,000 mcg Oral Daily  . [START ON 07/07/2016] Vitamin D (Ergocalciferol)  50,000 Units Oral Q7 days   Continuous Infusions: . sodium chloride 75 mL/hr at 07/05/16 1734   Principal Problem:   Fever Active Problems:   Wegner's disease (congenital syphilitic osteochondritis)   Adrenal insufficiency (HCC)   OSA (obstructive sleep apnea)   Peripheral neuropathy (HCC)   Hypotension   Rash   CKD (chronic kidney disease) stage 3, GFR 30-59  ml/min  Time spent:   Standley Dakins, MD, FAAFP [facility] Pager [phone]  If 7PM-7AM, please contact night-coverage [email or web address] Password [email or web address] 07/06/2016, 11:30 AM    LOS: 1 day

## 2016-07-06 NOTE — Consult Note (Signed)
[facility] Surgery Consult Note  [name] 1965-12-12  [id number].    Requesting MD: Dr. 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 She was seen by her PCP who sent her to the ED. Patient states that she first noticed the rash on her chest, before it spread to her face and extremities. Associated symptoms include chills and arthralgias. She denies CP, SOB, abdominal pain, nausea, vomiting, diarrhea and weakness. She denies recent travel or tick bites. General surgery was asked to consult for possible biopsy of the rash, as well as I&D of a left hip abscess that was found incidentally during exam. She denies use of blood thinning medications.  Past surgeries include vaginal hysterectomy, splenic aneurysm repair, septoplasty, hernia repair, cholecystectomy, and appendectomy. 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   ROS: All systems reviewed and otherwise negative except for as above  Family History  Problem Relation Age of Onset  . Diabetes Mother   . Heart disease Mother   . Diabetes Father     Past Medical History:  Diagnosis Date  . Chronic cough   . Diabetes mellitus without complication ([employer])   . Hypertension   . OSA (obstructive sleep apnea)   . Peripheral neuropathy ([place])   . Wegener's granulomatosis ([place]) 2009  . Wegner's disease (congenital syphilitic osteochondritis)     Past Surgical History:  Procedure Laterality Date  . APPENDECTOMY    . BACK SURGERY    . CHOLECYSTECTOMY    . COLONOSCOPY N/A 06/02/2013   Procedure: COLONOSCOPY;  Surgeon: Wonda Horner, MD;  Location:  [facility] ENDOSCOPY;  Service: Endoscopy;  Laterality: N/A;  . HERNIA REPAIR  2000  . SEPTOPLASTY  02/2013  . spleen anuyism    . splenic aneurysm    . VAGINAL HYSTERECTOMY      Social History:  reports that she has never smoked. She has never used smokeless tobacco. She reports that she drinks alcohol. She reports that she does not use drugs.  Allergies:  Allergies  Allergen Reactions  . Motrin [Ibuprofen] Other (See Comments)    Pt is unable to take this due to kidney problems.    . Penicillins Rash and Other (See Comments)    Has patient had a PCN reaction causing immediate rash, facial/tongue/throat swelling, SOB or lightheadedness with hypotension: No Has patient had a PCN reaction causing severe rash involving mucus membranes or skin necrosis: No Has patient had a PCN reaction that required hospitalization No Has patient had a PCN reaction occurring within the last 10 years: No If all of the above answers are "NO", then may proceed with Cephalosporin use.    Medications Prior to Admission  Medication Sig Dispense Refill  . albuterol (PROVENTIL HFA;VENTOLIN HFA) 108 (90 Base) MCG/ACT inhaler Inhale 2 puffs into the lungs every 6 (six) hours as needed for wheezing or shortness of breath.    Marland Kitchen b complex vitamins tablet Take 1 tablet by mouth daily.    . DULoxetine (CYMBALTA) 60 MG capsule Take 60 mg by mouth 2 (two) times daily.    Marland Kitchen esomeprazole (NEXIUM) 40 MG capsule Take 40 mg by mouth daily.     Marland Kitchen  fludrocortisone (FLORINEF) 0.1 MG tablet Take 0.1 mg by mouth daily.    . fluticasone (FLONASE) 50 MCG/ACT nasal spray Place 2 sprays into both nostrils daily as needed for rhinitis.    . folic acid (FOLVITE) 1 MG tablet Take 1 mg by mouth daily.    . furosemide (LASIX) 40 MG tablet Take 40 mg by mouth daily as needed for edema.    . gabapentin (NEURONTIN) 300 MG capsule Take 1,200-1,600 mg by mouth 2 (two) times daily. Pt takes three capsules in the morning and four at night.    .  hydrochlorothiazide (HYDRODIURIL) 25 MG tablet Take 25 mg by mouth daily.    . Lacosamide (VIMPAT) 100 MG TABS Take 100 mg by mouth 2 (two) times daily.    Marland Kitchen lidocaine (LIDODERM) 5 % Place 1 patch onto the skin daily as needed (for pain). Remove & Discard patch within 12 hours or as directed by MD    . losartan (COZAAR) 25 MG tablet Take 25 mg by mouth daily.    . methotrexate 250 MG/10ML injection Inject 25 mg into the vein once a week. Pt uses on Friday.    . nystatin (NYSTATIN) powder Apply 1 g topically 2 (two) times daily as needed (for irritation).    . nystatin-triamcinolone (MYCOLOG II) cream Apply 1 application topically 2 (two) times daily as needed (for irritation).    . pantoprazole (PROTONIX) 40 MG tablet Take 40 mg by mouth daily.    . potassium chloride SA (K-DUR,KLOR-CON) 20 MEQ tablet Take 20 mEq by mouth daily as needed (when taking Lasix).    . predniSONE (DELTASONE) 5 MG tablet Take 5 mg by mouth daily.    Marland Kitchen sulfamethoxazole-trimethoprim (BACTRIM DS) 800-160 MG per tablet Take 1 tablet by mouth every Monday, Wednesday, and Friday.    . topiramate (TOPAMAX) 50 MG tablet Take 50 mg by mouth at bedtime.    . traMADol (ULTRAM) 50 MG tablet Take 50-100 mg by mouth every 6 (six) hours as needed for moderate pain.    . traZODone (DESYREL) 50 MG tablet Take 50 mg by mouth at bedtime.    . vitamin B-12 (CYANOCOBALAMIN) 1000 MCG tablet Take 1,000 mcg by mouth daily.    . Vitamin D, Ergocalciferol, (DRISDOL) 50000 units CAPS capsule Take 50,000 Units by mouth every 7 (seven) days. Pt takes on Friday.      Blood pressure 133/90, pulse 90, temperature (!) 102.1 F (38.9 C), temperature source Oral, resp. rate 20, height '5\' 6"'$  (1.676 m), weight 106.5 kg (234 lb 12.6 oz), SpO2 100 %. Physical Exam: General: pleasant,obese white female female who is laying in bed in NAD HEENT: head is normocephalic, atraumatic.  Sclera are noninjected.  PERRL. Maculopapular rash over cheeks. Heart: regular,  rate, and rhythm.  No obvious murmurs, gallops, or rubs noted.  Palpable pedal pulses bilaterally Lungs: CTAB, no wheezes, rhonchi, or rales noted.  Respiratory effort nonlabored Abd: soft, NT/ND, +BS, no masses, hernias, or organomegaly; recent LUQ surgical scar noted. MS: all 4 extremities are symmetrical with no cyanosis, clubbing, or edema. Skin: warm and dry with generalized maculopapular rash. Small left thigh/hip abscess 2-3 cm, not actively draining. Mildly tender. Psych: A&Ox3 with an appropriate affect. Neuro: normal speech  Results for orders placed or performed during the hospital encounter of 07/04/16 (from the past 48 hour(s))  Urinalysis, Routine w reflex microscopic     Status: Abnormal   Collection Time: 07/04/16  2:00 PM  Result Value Ref Range  Color, Urine YELLOW YELLOW   APPearance CLOUDY (A) CLEAR   Specific Gravity, Urine 1.020 1.005 - 1.030   pH 6.5 5.0 - 8.0   Glucose, UA NEGATIVE NEGATIVE mg/dL   Hgb urine dipstick NEGATIVE NEGATIVE   Bilirubin Urine NEGATIVE NEGATIVE   Ketones, ur NEGATIVE NEGATIVE mg/dL   Protein, ur NEGATIVE NEGATIVE mg/dL   Nitrite NEGATIVE NEGATIVE   Leukocytes, UA MODERATE (A) NEGATIVE  Urine culture     Status: Abnormal   Collection Time: 07/04/16  2:00 PM  Result Value Ref Range   Specimen Description URINE, CLEAN CATCH    Special Requests NONE    Culture 60,000 COLONIES/mL ENTEROCOCCUS FAECALIS (A)    Report Status 07/06/2016 FINAL    Organism ID, Bacteria ENTEROCOCCUS FAECALIS (A)       Susceptibility   Enterococcus faecalis - MIC*    AMPICILLIN <=2 SENSITIVE Sensitive     LEVOFLOXACIN 0.5 SENSITIVE Sensitive     NITROFURANTOIN <=16 SENSITIVE Sensitive     VANCOMYCIN 1 SENSITIVE Sensitive     * 60,000 COLONIES/mL ENTEROCOCCUS FAECALIS  Urine microscopic-add on     Status: Abnormal   Collection Time: 07/04/16  2:00 PM  Result Value Ref Range   Squamous Epithelial / LPF 6-30 (A) NONE SEEN   WBC, UA TOO NUMEROUS TO COUNT 0 -  5 WBC/hpf   RBC / HPF NONE SEEN 0 - 5 RBC/hpf   Bacteria, UA FEW (A) NONE SEEN  Blood culture (routine x 2)     Status: None (Preliminary result)   Collection Time: 07/04/16  2:39 PM  Result Value Ref Range   Specimen Description BLOOD LEFT HAND    Special Requests BOTTLES DRAWN AEROBIC ONLY 5 CC    Culture      NO GROWTH < 24 HOURS Performed at [facility]    Report Status PENDING   Basic metabolic panel     Status: Abnormal   Collection Time: 07/04/16  2:44 PM  Result Value Ref Range   Sodium 135 135 - 145 mmol/L   Potassium 3.7 3.5 - 5.1 mmol/L   Chloride 106 101 - 111 mmol/L   CO2 21 (L) 22 - 32 mmol/L   Glucose, Bld 90 65 - 99 mg/dL   BUN 15 6 - 20 mg/dL   Creatinine, Ser 1.39 (H) 0.44 - 1.00 mg/dL   Calcium 8.9 8.9 - 10.3 mg/dL   GFR calc non Af Amer 43 (L) >60 mL/min   GFR calc Af Amer 50 (L) >60 mL/min    Comment: (NOTE) The eGFR has been calculated using the CKD EPI equation. This calculation has not been validated in all clinical situations. eGFR's persistently <60 mL/min signify possible Chronic Kidney Disease.    Anion gap 8 5 - 15  CBC with Differential     Status: None   Collection Time: 07/04/16  2:44 PM  Result Value Ref Range   WBC 7.7 4.0 - 10.5 K/uL   RBC 4.83 3.87 - 5.11 MIL/uL   Hemoglobin 14.4 12.0 - 15.0 g/dL   HCT 44.3 36.0 - 46.0 %   MCV 91.7 78.0 - 100.0 fL   MCH 29.8 26.0 - 34.0 pg   MCHC 32.5 30.0 - 36.0 g/dL   RDW 15.2 11.5 - 15.5 %   Platelets 303 150 - 400 K/uL   Neutrophils Relative % 46 %   Neutro Abs 3.6 1.7 - 7.7 K/uL   Lymphocytes Relative 50 %   Lymphs Abs 3.8 0.7 -  4.0 K/uL   Monocytes Relative 3 %   Monocytes Absolute 0.3 0.1 - 1.0 K/uL   Eosinophils Relative 0 %   Eosinophils Absolute 0.0 0.0 - 0.7 K/uL   Basophils Relative 1 %   Basophils Absolute 0.1 0.0 - 0.1 K/uL  Blood culture (routine x 2)     Status: None (Preliminary result)   Collection Time: 07/04/16  2:54 PM  Result Value Ref Range   Specimen  Description BLOOD RIGHT ANTECUBITAL    Special Requests BOTTLES DRAWN AEROBIC AND ANAEROBIC 5 CC EA    Culture      NO GROWTH < 24 HOURS Performed at [facility]    Report Status PENDING   I-Stat CG4 Lactic Acid, ED     Status: None   Collection Time: 07/04/16  2:58 PM  Result Value Ref Range   Lactic Acid, Venous 0.96 0.5 - 1.9 mmol/L  Comprehensive metabolic panel     Status: Abnormal   Collection Time: 07/05/16  6:06 AM  Result Value Ref Range   Sodium 140 135 - 145 mmol/L   Potassium 4.2 3.5 - 5.1 mmol/L   Chloride 113 (H) 101 - 111 mmol/L   CO2 21 (L) 22 - 32 mmol/L   Glucose, Bld 100 (H) 65 - 99 mg/dL   BUN 10 6 - 20 mg/dL   Creatinine, Ser 1.09 (H) 0.44 - 1.00 mg/dL   Calcium 8.3 (L) 8.9 - 10.3 mg/dL   Total Protein 6.0 (L) 6.5 - 8.1 g/dL   Albumin 3.0 (L) 3.5 - 5.0 g/dL   AST 41 15 - 41 U/L   ALT 30 14 - 54 U/L   Alkaline Phosphatase 104 38 - 126 U/L   Total Bilirubin 0.2 (L) 0.3 - 1.2 mg/dL   GFR calc non Af Amer 58 (L) >60 mL/min   GFR calc Af Amer >60 >60 mL/min    Comment: (NOTE) The eGFR has been calculated using the CKD EPI equation. This calculation has not been validated in all clinical situations. eGFR's persistently <60 mL/min signify possible Chronic Kidney Disease.    Anion gap 6 5 - 15  CBC     Status: None   Collection Time: 07/05/16  6:06 AM  Result Value Ref Range   WBC 6.6 4.0 - 10.5 K/uL   RBC 4.18 3.87 - 5.11 MIL/uL   Hemoglobin 12.5 12.0 - 15.0 g/dL   HCT 38.8 36.0 - 46.0 %   MCV 92.8 78.0 - 100.0 fL   MCH 29.9 26.0 - 34.0 pg   MCHC 32.2 30.0 - 36.0 g/dL   RDW 15.3 11.5 - 15.5 %   Platelets 245 150 - 400 K/uL  Glucose, capillary     Status: Abnormal   Collection Time: 07/05/16  7:43 AM  Result Value Ref Range   Glucose-Capillary 117 (H) 65 - 99 mg/dL  Glucose, capillary     Status: Abnormal   Collection Time: 07/06/16  7:46 AM  Result Value Ref Range   Glucose-Capillary 131 (H) 65 - 99 mg/dL   Dg Chest 2 View  Result  Date: 07/04/2016 CLINICAL DATA:  Cough.  Fevers.  Rash. EXAM: CHEST  2 VIEW COMPARISON:  06/07/2016 chest radiograph. FINDINGS: Stable cardiomediastinal silhouette with normal heart size. No pneumothorax. No pleural effusion. Lungs appear clear, with no acute consolidative airspace disease and no pulmonary edema. Minimal thoracic spondylosis. Cholecystectomy clips are seen in the right upper quadrant of the abdomen. IMPRESSION: No active cardiopulmonary disease. Electronically Signed  By: Ilona Sorrel M.D.   On: 07/04/2016 14:44   Assessment/Plan Fever - Blood Cx pending, urine culture growing enterococcus - source unknown at this time Generalized maculopapular rash - excisional biopsy tomorrow 07/07/16 Wegner's disease - low dose prednisone Abscess left hip- improving on IV abx, unlikely the source of the patient's fever; I&D tomorrow 07/07/16 Adrenal insufficiency - Florinef, Methotrexate Enterococcus UTI Chronic pain OSA Chemotherapy induced peripheral neuropathy essential HTN CKD III  FEN: NPO after MN ID: Vancomycin; infectious disease has been consulted, awaiting further recommendations.  Plan: NPO after midnight. Dr. Dalbert Batman will perform I&D of left thigh abscess and excisional biopsy of left thigh lesion in the OR tomorrow.   Jill Alexanders, [name] 07/06/2016, 12:38 PM Pager: [phone] Consults: [phone] Mon-Fri 7:00 am-4:30 pm Sat-Sun 7:00 am-11:30 am

## 2016-07-06 NOTE — Progress Notes (Signed)
[facility]    Date of Admission:  07/04/2016           Day 2 vancomycin  Principal Problem:   Fever Active Problems:   Hypotension   Rash   Wegner's disease (congenital syphilitic osteochondritis)   Adrenal insufficiency (HCC)   OSA (obstructive sleep apnea)   Peripheral neuropathy (HCC)   CKD (chronic kidney disease) stage 3, GFR 30-59 ml/min   . B-complex with vitamin C  1 tablet Oral Daily  . DULoxetine  60 mg Oral BID  . fludrocortisone  0.1 mg Oral Daily  . folic acid  1 mg Oral Daily  . gabapentin  1,200 mg Oral Daily  . gabapentin  1,600 mg Oral QHS  . lacosamide  100 mg Oral BID  . pantoprazole  40 mg Oral Daily  . predniSONE  5 mg Oral Daily  . topiramate  50 mg Oral QHS  . traZODone  50 mg Oral QHS  . vitamin B-12  1,000 mcg Oral Daily  . [START ON 07/07/2016] Vitamin D (Ergocalciferol)  50,000 Units Oral Q7 days    SUBJECTIVE: She is not feeling any better. She has aching all over. She describes muscle aches and joint aches some headache and upper back pain. She says that after her new pain pump was placed recently she noticed a slight improvement in her neuropathic leg pain but started having these other pains. They began about one month ago, well before she had onset of her fever. When [facility] commented that she had noted some vesicles around her incision site and under her breast. There is no comment about seeing the vesicles on his exam. Since the pump was placed she has been receiving low-dose ziconotide. Through the pump. She did have to have a blood patch after the pump was placed for a CSF leak.   Review of Systems: Review of Systems  Constitutional: Positive for chills, fever and malaise/fatigue. Negative for diaphoresis and weight loss.  HENT: Negative for sore throat.   Respiratory: Positive for cough. Negative for sputum production and shortness of breath.   Cardiovascular:  Negative for chest pain.  Gastrointestinal: Negative for abdominal pain, diarrhea, nausea and vomiting.  Genitourinary: Negative for dysuria.  Musculoskeletal: Positive for joint pain and myalgias.  Skin: Positive for rash. Negative for itching.  Neurological: Positive for headaches.    Past Medical History:  Diagnosis Date  . Chronic cough   . Diabetes mellitus without complication (HCC)   . Hypertension   . OSA (obstructive sleep apnea)   . Peripheral neuropathy (HCC)   . Wegener's granulomatosis (HCC) 2009  . Wegner's disease (congenital syphilitic osteochondritis)     Social History  Substance Use Topics  . Smoking status: Never Smoker  . Smokeless tobacco: Never Used  . Alcohol use Yes     Comment: occasionally    Family History  Problem Relation Age of Onset  . Diabetes Mother   . Heart disease Mother   . Diabetes Father    Allergies  Allergen Reactions  . Motrin [Ibuprofen] Other (See Comments)    Pt is unable to take this due to kidney problems.    . Penicillins Rash and Other (See Comments)    Has patient had a PCN reaction causing immediate rash, facial/tongue/throat swelling, SOB or lightheadedness with hypotension: No Has patient had a PCN reaction causing severe rash involving mucus membranes or  skin necrosis: No Has patient had a PCN reaction that required hospitalization No Has patient had a PCN reaction occurring within the last 10 years: No If all of the above answers are "NO", then may proceed with Cephalosporin use.    OBJECTIVE: Vitals:   07/06/16 0559 07/06/16 0853 07/06/16 1028 07/06/16 1410  BP: 133/90   117/78  Pulse: 90   92  Resp: 20   20  Temp: (!) 102.4 F (39.1 C) (!) 101.9 F (38.8 C) (!) 102.1 F (38.9 C) (!) 101.4 F (38.6 C)  TempSrc: Oral Oral Oral Oral  SpO2: 100%   97%  Weight:      Height:       Body mass index is 37.9 kg/m.  Physical Exam  Constitutional: She is oriented to person, place, and time.  She is alert  and resting quietly in bed. She is covered in blankets. Her husband is at the bedside.  HENT:  Mouth/Throat: No oropharyngeal exudate.  Eyes: Conjunctivae are normal.  She has some new, slight conjunctival redness on the left.  Neck: Neck supple.  Cardiovascular: Normal rate and regular rhythm.   No murmur heard. Pulmonary/Chest: Effort normal and breath sounds normal. She has no wheezes. She has no rales.  Abdominal: Soft. There is no tenderness.  Musculoskeletal: Normal range of motion. She exhibits no edema or tenderness.  Neurological: She is alert and oriented to person, place, and time.  Skin: Rash noted. There is erythema.  The lesion on her left hip has doubled in size since admission. There is central ulceration with a surrounding rim of erythema but there is no fluctuance or drainage to suggest abscess. Her scattered erythematous, maculopapular lesions continue to progress slowly.  Psychiatric: Mood and affect normal.    Lab Results Lab Results  Component Value Date   WBC 6.6 07/05/2016   HGB 12.5 07/05/2016   HCT 38.8 07/05/2016   MCV 92.8 07/05/2016   PLT 245 07/05/2016    Lab Results  Component Value Date   CREATININE 1.09 (H) 07/05/2016   BUN 10 07/05/2016   NA 140 07/05/2016   K 4.2 07/05/2016   CL 113 (H) 07/05/2016   CO2 21 (L) 07/05/2016    Lab Results  Component Value Date   ALT 30 07/05/2016   AST 41 07/05/2016   ALKPHOS 104 07/05/2016   BILITOT 0.2 (L) 07/05/2016     Microbiology: Recent Results (from the past 240 hour(s))  Urine culture     Status: Abnormal   Collection Time: 07/04/16  2:00 PM  Result Value Ref Range Status   Specimen Description URINE, CLEAN CATCH  Final   Special Requests NONE  Final   Culture 60,000 COLONIES/mL ENTEROCOCCUS FAECALIS (A)  Final   Report Status 07/06/2016 FINAL  Final   Organism ID, Bacteria ENTEROCOCCUS FAECALIS (A)  Final      Susceptibility   Enterococcus faecalis - MIC*    AMPICILLIN <=2 SENSITIVE  Sensitive     LEVOFLOXACIN 0.5 SENSITIVE Sensitive     NITROFURANTOIN <=16 SENSITIVE Sensitive     VANCOMYCIN 1 SENSITIVE Sensitive     * 60,000 COLONIES/mL ENTEROCOCCUS FAECALIS  Blood culture (routine x 2)     Status: None (Preliminary result)   Collection Time: 07/04/16  2:39 PM  Result Value Ref Range Status   Specimen Description BLOOD LEFT HAND  Final   Special Requests BOTTLES DRAWN AEROBIC ONLY 5 CC  Final   Culture   Final    NO  GROWTH 2 DAYS Performed at [facility]    Report Status PENDING  Incomplete  Blood culture (routine x 2)     Status: None (Preliminary result)   Collection Time: 07/04/16  2:54 PM  Result Value Ref Range Status   Specimen Description BLOOD RIGHT ANTECUBITAL  Final   Special Requests BOTTLES DRAWN AEROBIC AND ANAEROBIC 5 CC EA  Final   Culture   Final    NO GROWTH 2 DAYS Performed at [facility]    Report Status PENDING  Incomplete     ASSESSMENT: She is no longer hypotensive but she continues to have fever and unexplained rash. I doubt seriously that the fever and rash are due to her new pain pump or the blood patch given that the fever and rash began weeks after those procedures and it would be difficult to explain the rash on the basis of meningitis/spine infection. I do not see fever and rash listed as an adverse reaction to ziconotide. She is immunosuppressed and on chronic steroids. I will check a serum cryptococcal antigen just in case he has disseminated cryptococcosis. We have requested a skin biopsy and Dr. Derrell Lolling will perform it for [place] tomorrow. I do not think that she has a left hip abscess that needs to be drained.  PLAN: 1. Stop vancomycin now 2. Serum cryptococcal antigen 3. Skin biopsy tomorrow  Cliffton Asters, MD [facility] [phone] pager   [phone] cell 07/06/2016, 4:28 PM

## 2016-07-07 ENCOUNTER — Inpatient Hospital Stay (HOSPITAL_COMMUNITY): Payer: BC Managed Care – PPO | Admitting: Anesthesiology

## 2016-07-07 ENCOUNTER — Encounter (HOSPITAL_COMMUNITY)
Admission: EM | Disposition: A | Discharge status: Other Acute Inpatient Hospital | Payer: Self-pay | Source: Home / Self Care | Attending: Family Medicine

## 2016-07-07 ENCOUNTER — Encounter (HOSPITAL_COMMUNITY): Payer: Self-pay | Admitting: Registered Nurse

## 2016-07-07 DIAGNOSIS — Z9889 Other specified postprocedural states: Secondary | ICD-10-CM

## 2016-07-07 DIAGNOSIS — R509 Fever, unspecified: Secondary | ICD-10-CM | POA: Diagnosis present

## 2016-07-07 HISTORY — PX: INCISION AND DRAINAGE ABSCESS: SHX5864

## 2016-07-07 LAB — CBC
HEMATOCRIT: 34.4 % — AB (ref 36.0–46.0)
Hemoglobin: 11.2 g/dL — ABNORMAL LOW (ref 12.0–15.0)
MCH: 29.2 pg (ref 26.0–34.0)
MCHC: 32.6 g/dL (ref 30.0–36.0)
MCV: 89.8 fL (ref 78.0–100.0)
Platelets: 236 10*3/uL (ref 150–400)
RBC: 3.83 MIL/uL — AB (ref 3.87–5.11)
RDW: 15.4 % (ref 11.5–15.5)
WBC: 9.2 10*3/uL (ref 4.0–10.5)

## 2016-07-07 LAB — BASIC METABOLIC PANEL
ANION GAP: 6 (ref 5–15)
BUN: 6 mg/dL (ref 6–20)
CHLORIDE: 109 mmol/L (ref 101–111)
CO2: 21 mmol/L — ABNORMAL LOW (ref 22–32)
Calcium: 7.9 mg/dL — ABNORMAL LOW (ref 8.9–10.3)
Creatinine, Ser: 1.03 mg/dL — ABNORMAL HIGH (ref 0.44–1.00)
GFR calc Af Amer: >60 mL/min (ref >60)
GLUCOSE: 100 mg/dL — AB (ref 65–99)
POTASSIUM: 3.3 mmol/L — AB (ref 3.5–5.1)
Sodium: 136 mmol/L (ref 135–145)

## 2016-07-07 SURGERY — INCISION AND DRAINAGE, ABSCESS
Anesthesia: General | Laterality: Left

## 2016-07-07 MED ORDER — MIDAZOLAM HCL 5 MG/5ML IJ SOLN
INTRAMUSCULAR | Status: DC | PRN
Start: 2016-07-07 — End: 2016-07-07
  Administered 2016-07-07: 2 mg via INTRAVENOUS

## 2016-07-07 MED ORDER — BUPIVACAINE-EPINEPHRINE 0.5% -1:200000 IJ SOLN
INTRAMUSCULAR | Status: AC
  Filled 2016-07-07: qty 1

## 2016-07-07 MED ORDER — BUPIVACAINE-EPINEPHRINE 0.5% -1:200000 IJ SOLN
INTRAMUSCULAR | Status: DC | PRN
  Administered 2016-07-07: 7 mL

## 2016-07-07 MED ORDER — LIDOCAINE 2% (20 MG/ML) 5 ML SYRINGE
INTRAMUSCULAR | Status: AC
  Filled 2016-07-07: qty 5

## 2016-07-07 MED ORDER — ENOXAPARIN SODIUM 40 MG/0.4ML ~~LOC~~ SOLN
40.0000 mg | SUBCUTANEOUS | Status: DC
  Administered 2016-07-08 – 2016-07-14 (×7): 40 mg via SUBCUTANEOUS
  Filled 2016-07-07 (×7): qty 0.4

## 2016-07-07 MED ORDER — HYDROMORPHONE HCL 1 MG/ML IJ SOLN
1.0000 mg | INTRAMUSCULAR | Status: DC | PRN
  Administered 2016-07-07 (×2): 1 mg via INTRAVENOUS
  Filled 2016-07-07 (×2): qty 1

## 2016-07-07 MED ORDER — LACTATED RINGERS IV SOLN
INTRAVENOUS | Status: DC
  Administered 2016-07-07: 11:00:00 via INTRAVENOUS

## 2016-07-07 MED ORDER — LACTATED RINGERS IV SOLN: INTRAVENOUS | Status: DC

## 2016-07-07 MED ORDER — 0.9 % SODIUM CHLORIDE (POUR BTL) OPTIME
TOPICAL | Status: DC | PRN
  Administered 2016-07-07: 1000 mL

## 2016-07-07 MED ORDER — PROPOFOL 10 MG/ML IV BOLUS
INTRAVENOUS | Status: DC | PRN
  Administered 2016-07-07: 20 mg via INTRAVENOUS

## 2016-07-07 MED ORDER — FENTANYL CITRATE (PF) 100 MCG/2ML IJ SOLN
25.0000 ug | INTRAMUSCULAR | Status: DC | PRN
  Administered 2016-07-07 (×2): 50 ug via INTRAVENOUS

## 2016-07-07 MED ORDER — DEXAMETHASONE SODIUM PHOSPHATE 10 MG/ML IJ SOLN
INTRAMUSCULAR | Status: AC
  Filled 2016-07-07: qty 1

## 2016-07-07 MED ORDER — LACTATED RINGERS IV SOLN
INTRAVENOUS | Status: DC | PRN
  Administered 2016-07-07: 09:00:00 via INTRAVENOUS

## 2016-07-07 MED ORDER — OXYCODONE HCL 5 MG PO TABS
5.0000 mg | ORAL_TABLET | ORAL | Status: DC | PRN
  Administered 2016-07-08: 10 mg via ORAL
  Administered 2016-07-08 (×2): 5 mg via ORAL
  Administered 2016-07-09 – 2016-07-10 (×5): 10 mg via ORAL
  Administered 2016-07-11: 5 mg via ORAL
  Administered 2016-07-11 – 2016-07-14 (×11): 10 mg via ORAL
  Filled 2016-07-07 (×5): qty 2
  Filled 2016-07-07: qty 1
  Filled 2016-07-07 (×9): qty 2
  Filled 2016-07-07: qty 1
  Filled 2016-07-07 (×4): qty 2

## 2016-07-07 MED ORDER — POTASSIUM CHLORIDE CRYS ER 20 MEQ PO TBCR
40.0000 meq | EXTENDED_RELEASE_TABLET | Freq: Once | ORAL | Status: AC
  Administered 2016-07-07: 40 meq via ORAL
  Filled 2016-07-07: qty 2

## 2016-07-07 MED ORDER — HYDROMORPHONE HCL 1 MG/ML IJ SOLN
1.0000 mg | Freq: Once | INTRAMUSCULAR | Status: AC
Start: 2016-07-07 — End: 2016-07-07
  Administered 2016-07-07: 1 mg via INTRAVENOUS
  Filled 2016-07-07: qty 1

## 2016-07-07 MED ORDER — FENTANYL CITRATE (PF) 100 MCG/2ML IJ SOLN
INTRAMUSCULAR | Status: DC | PRN
  Administered 2016-07-07: 100 ug via INTRAVENOUS

## 2016-07-07 MED ORDER — ACETAMINOPHEN 10 MG/ML IV SOLN
1000.0000 mg | Freq: Once | INTRAVENOUS | Status: AC
  Administered 2016-07-07: 1000 mg via INTRAVENOUS
  Filled 2016-07-07: qty 100

## 2016-07-07 MED ORDER — DEXAMETHASONE SODIUM PHOSPHATE 10 MG/ML IJ SOLN
INTRAMUSCULAR | Status: DC | PRN
  Administered 2016-07-07: 10 mg via INTRAVENOUS

## 2016-07-07 MED ORDER — MIDAZOLAM HCL 2 MG/2ML IJ SOLN
INTRAMUSCULAR | Status: AC
  Filled 2016-07-07: qty 2

## 2016-07-07 MED ORDER — PROPOFOL 10 MG/ML IV BOLUS
INTRAVENOUS | Status: AC
  Filled 2016-07-07: qty 20

## 2016-07-07 MED ORDER — ONDANSETRON HCL 4 MG/2ML IJ SOLN
INTRAMUSCULAR | Status: DC | PRN
  Administered 2016-07-07: 4 mg via INTRAVENOUS

## 2016-07-07 MED ORDER — ONDANSETRON HCL 4 MG/2ML IJ SOLN: 4.0000 mg | Freq: Once | INTRAMUSCULAR | Status: DC | PRN

## 2016-07-07 MED ORDER — SODIUM BICARBONATE 4 % IV SOLN
INTRAVENOUS | Status: AC
  Filled 2016-07-07: qty 5

## 2016-07-07 MED ORDER — FENTANYL CITRATE (PF) 100 MCG/2ML IJ SOLN
INTRAMUSCULAR | Status: AC
  Filled 2016-07-07: qty 2

## 2016-07-07 MED ORDER — ONDANSETRON HCL 4 MG/2ML IJ SOLN
INTRAMUSCULAR | Status: AC
  Filled 2016-07-07: qty 2

## 2016-07-07 MED ORDER — ONDANSETRON HCL 4 MG/2ML IJ SOLN: 4.0000 mg | Freq: Four times a day (QID) | INTRAMUSCULAR | Status: DC | PRN

## 2016-07-07 MED ORDER — ONDANSETRON 4 MG PO TBDP: 4.0000 mg | ORAL_TABLET | Freq: Four times a day (QID) | ORAL | Status: DC | PRN

## 2016-07-07 MED ORDER — SODIUM BICARBONATE 4 % IV SOLN
INTRAVENOUS | Status: DC | PRN
  Administered 2016-07-07: 1 mL via SUBCUTANEOUS

## 2016-07-07 MED ORDER — LIDOCAINE 2% (20 MG/ML) 5 ML SYRINGE
INTRAMUSCULAR | Status: DC | PRN
  Administered 2016-07-07: 100 mg via INTRAVENOUS

## 2016-07-07 MED ORDER — PANTOPRAZOLE SODIUM 40 MG PO TBEC
40.0000 mg | DELAYED_RELEASE_TABLET | Freq: Every day | ORAL | Status: DC
  Administered 2016-07-07 – 2016-07-14 (×8): 40 mg via ORAL
  Filled 2016-07-07 (×8): qty 1

## 2016-07-07 MED ORDER — PROPOFOL 500 MG/50ML IV EMUL
INTRAVENOUS | Status: DC | PRN
  Administered 2016-07-07: 50 ug/kg/min via INTRAVENOUS

## 2016-07-07 SURGICAL SUPPLY — 34 items
BLADE SURG 15 STRL LF DISP TIS (BLADE) ×1 IMPLANT
BLADE SURG 15 STRL SS (BLADE) ×1
BNDG GAUZE ELAST 4 BULKY (GAUZE/BANDAGES/DRESSINGS) IMPLANT
CHLORAPREP W/TINT 26ML (MISCELLANEOUS) ×2 IMPLANT
COVER SURGICAL LIGHT HANDLE (MISCELLANEOUS) ×2 IMPLANT
DECANTER SPIKE VIAL GLASS SM (MISCELLANEOUS) IMPLANT
DRAPE LAPAROSCOPIC ABDOMINAL (DRAPES) ×2 IMPLANT
DRSG PAD ABDOMINAL 8X10 ST (GAUZE/BANDAGES/DRESSINGS) IMPLANT
DRSG TEGADERM 4X4.75 (GAUZE/BANDAGES/DRESSINGS) ×2 IMPLANT
ELECT PENCIL ROCKER SW 15FT (MISCELLANEOUS) ×2 IMPLANT
ELECT REM PT RETURN 9FT ADLT (ELECTROSURGICAL) ×2
ELECTRODE REM PT RTRN 9FT ADLT (ELECTROSURGICAL) ×1 IMPLANT
GAUZE PACKING IODOFORM 1/4X15 (GAUZE/BANDAGES/DRESSINGS) ×2 IMPLANT
GAUZE SPONGE 2X2 8PLY STRL LF (GAUZE/BANDAGES/DRESSINGS) ×1 IMPLANT
GAUZE SPONGE 4X4 12PLY STRL (GAUZE/BANDAGES/DRESSINGS) IMPLANT
GLOVE EUDERMIC 7 POWDERFREE (GLOVE) ×2 IMPLANT
GOWN STRL REUS W/TWL XL LVL3 (GOWN DISPOSABLE) ×4 IMPLANT
KIT BASIN OR (CUSTOM PROCEDURE TRAY) ×2 IMPLANT
NEEDLE HYPO 25X1 1.5 SAFETY (NEEDLE) ×2 IMPLANT
PACK GENERAL/GYN (CUSTOM PROCEDURE TRAY) ×2 IMPLANT
SPONGE GAUZE 2X2 STER 10/PKG (GAUZE/BANDAGES/DRESSINGS) ×1
SPONGE LAP 18X18 X RAY DECT (DISPOSABLE) ×2 IMPLANT
SUT ETHIBOND 3 0 (SUTURE) ×2 IMPLANT
SUT MNCRL AB 4-0 PS2 18 (SUTURE) IMPLANT
SUT VIC AB 3-0 SH 27 (SUTURE)
SUT VIC AB 3-0 SH 27XBRD (SUTURE) IMPLANT
SWAB COLLECTION DEVICE MRSA (MISCELLANEOUS) IMPLANT
SWAB CULTURE ESWAB REG 1ML (MISCELLANEOUS) IMPLANT
SYR 20CC LL (SYRINGE) ×2 IMPLANT
SYR BULB IRRIGATION 50ML (SYRINGE) ×2 IMPLANT
TAPE CLOTH SURG 4X10 WHT LF (GAUZE/BANDAGES/DRESSINGS) ×2 IMPLANT
TOWEL OR 17X26 10 PK STRL BLUE (TOWEL DISPOSABLE) ×2 IMPLANT
TOWEL OR NON WOVEN STRL DISP B (DISPOSABLE) ×2 IMPLANT
YANKAUER SUCT BULB TIP NO VENT (SUCTIONS) ×2 IMPLANT

## 2016-07-07 NOTE — Progress Notes (Signed)
PROGRESS NOTE   [name]  ZOX:[id number]  DOB: 1966/07/27  DOA: 07/04/2016 PCP: No PCP Per Patient  Hospital course: [name] is a 50 y.o. female with medical history significant for Wagner's granulomatosis on low-dose prednisone, methotrexate, adrenal insufficiency on Florinef, hypertension who presented to [facility] long hospital for evaluation of intermittent fevers, generalized rash for past 4 days prior to this admission. She has been seen by primary care physician who referred her to emergency room for evaluation. Patient reported that rash started initially on lower extremities and then spread to her trunk. She reports no exposure to ticks. No reports of IV drug abuse. She did not know what the maximum temperature was but her fever has been intermittent and worse over past 24 hours. No reports of chest pain or shortness of breath. No cough.  No abdominal pain, nausea, vomiting or diarrhea.  Assessment & Plan:     Fever / Generalized maculopapular rash / Hypotension - Patient presented with fever, generalized painful rash and hypotension. She she has required IV fluids on the admission which has temporarily normalize her blood pressure to 108/73 but her current BP again 91/57.  Patient has history of adrenal insufficiency and is on Florinef as well as low dose prednisone, which will continue. - Presentation with fever and generalized rash is worrisome for acute infectious process, - Seen by infectious disease who recommended vancomycin, discontinued on 9/14.  - Follow-up blood culture results: No growth to date, repeat blood cultures done 9/14: NGTD - Urine culture: enterococcus positive sensitive to vancomycin - Hypotension - resolved now. - general surgery for skin biopsy and I&D small abscess done 9/15.      Wegner's disease (congenital syphilitic osteochondritis) - Continue low-dose prednisone  Small abscess left side  -s/p I&D in OR 9/15    Adrenal insufficiency (HCC) -  Continue Florinef, Methotrexate  Fever Unknown Cause - Blood cultures repeated 9/14  - Tylenol as needed  Enterococcus UTI  Pt was treated with vancomycin  Chronic Pain - Pt has implanted pain pump for intrathecal prialt administration (placed Aug 2017) by Dr. Elberta Leatherwood ([facility]) - I spoke with Dr Jeanice Lim 9/14. He suggested considering MRI L spine and/or LP.  He is available to be contacted by cell phone [phone]    OSA (obstructive sleep apnea) - Stable respiratory status    Peripheral neuropathy (HCC) - Continue gabapentin    Essential hypertension - Blood pressure medications on hold for now, BPs have been well controlled and stable.     CKD (chronic kidney disease) stage 3, GFR 30-59 ml/min - Baseline creatinine about 3 years ago is 1.14 - Creatinine on this admission is 1.3.  We will hold off on Lasix as this could have contributed to worsening renal function   DVT prophylaxis: SCDs bilaterally  Code Status: full code  Family Communication: husband at the bedside   Disposition Plan: medical floor  Consults called: ID, Dr. Cliffton Asters, general surgery called 9/14 Admission status: inpatient admission. Patient was hypotensive on the admission. She reported intermittent fevers and painful rash for last 4 days prior to this admission. She has also seen primary care physician who referred the patient for further evaluation to ED. Patient requires inpatient workup and treatment tincluding blood cultures, vancomycin. I anticipate she will require at least 2-3 days in the hospital specifically the time length for blood culture results. We consulted infectious disease who also recognizes the importance of admission and inpatient evaluation.   Antimicrobials: Anti-infectives  Start     Dose/Rate Route Frequency Ordered Stop   07/05/16 1600  vancomycin (VANCOCIN) 1,250 mg in sodium chloride 0.9 % 250 mL IVPB  Status:  Discontinued     1,250 mg 166.7 mL/hr over 90 Minutes  Intravenous Every 24 hours 07/04/16 1627 07/05/16 0828   07/05/16 0900  vancomycin (VANCOCIN) IVPB 750 mg/150 ml premix  Status:  Discontinued     750 mg 150 mL/hr over 60 Minutes Intravenous Every 12 hours 07/05/16 0828 07/06/16 1627   07/04/16 1630  vancomycin (VANCOCIN) 1,500 mg in sodium chloride 0.9 % 500 mL IVPB     1,500 mg 250 mL/hr over 120 Minutes Intravenous  Once 07/04/16 1618 07/04/16 1903        Subjective: Pt says rash persists and having higher fevers. Carbuncle left hip is painful but much less fluctuant.   Objective: Vitals:   07/07/16 1000 07/07/16 1012 07/07/16 1115 07/07/16 1219  BP: 99/65 102/61 111/68 110/65  Pulse: 75  74 79  Resp: 20 18 18 19   Temp:  99 F (37.2 C) 98.8 F (37.1 C) 98.2 F (36.8 C)  TempSrc:   Oral Oral  SpO2: 100% 95% 94% 98%  Weight:      Height:        Intake/Output Summary (Last 24 hours) at 07/07/16 1254 Last data filed at 07/07/16 2440  Gross per 24 hour  Intake             2125 ml  Output                0 ml  Net             2125 ml   Filed Weights   07/04/16 1143 07/04/16 1234 07/04/16 1838  Weight: 103.9 kg (229 lb) 103.9 kg (229 lb) 106.5 kg (234 lb 12.6 oz)   Exam:  Eyes: PERRL, lids and conjunctivae normal ENMT: Mucous membranes are moist. Posterior pharynx clear of any exudate or lesions.Normal dentition.  Neck: normal, supple, no masses, no thyromegaly Respiratory: clear to auscultation bilaterally, no wheezing, no crackles. Normal respiratory effort. No accessory muscle use.  Cardiovascular: Regular rate and rhythm, no murmurs / rubs / gallops. No extremity edema. 2+ pedal pulses. No carotid bruits.  Abdomen: no tenderness, no masses palpated. No hepatosplenomegaly. Bowel sounds positive.  Musculoskeletal: no clubbing / cyanosis. No joint deformity upper and lower extremities. Good ROM, no contractures. Normal muscle tone.  Skin: sporadic macular rash over trunk, lower extremities. Skin is warm and dry. Small  abscess 4 cm diameter fluctuant left side near hip erythematous, central scab noted, tender to palpation Neurologic: CN 2-12 grossly intact. Sensation intact, DTR normal. Strength 5/5 in all 4.  Psychiatric: Normal judgment and insight. Alert and oriented x 3. Normal mood.   Data Reviewed: Basic Metabolic Panel:  Recent Labs Lab 07/04/16 1444 07/05/16 0606 07/07/16 0539  NA 135 140 136  K 3.7 4.2 3.3*  CL 106 113* 109  CO2 21* 21* 21*  GLUCOSE 90 100* 100*  BUN 15 10 6   CREATININE 1.39* 1.09* 1.03*  CALCIUM 8.9 8.3* 7.9*   Liver Function Tests:  Recent Labs Lab 07/05/16 0606  AST 41  ALT 30  ALKPHOS 104  BILITOT 0.2*  PROT 6.0*  ALBUMIN 3.0*   No results for input(s): LIPASE, AMYLASE in the last 168 hours. No results for input(s): AMMONIA in the last 168 hours. CBC:  Recent Labs Lab 07/04/16 1444 07/05/16 0606 07/07/16 1130  WBC  7.7 6.6 9.2  NEUTROABS 3.6  --   --   HGB 14.4 12.5 11.2*  HCT 44.3 38.8 34.4*  MCV 91.7 92.8 89.8  PLT 303 245 236   Cardiac Enzymes: No results for input(s): CKTOTAL, CKMB, CKMBINDEX, TROPONINI in the last 168 hours. CBG (last 3)   Recent Labs  07/05/16 0743 07/06/16 0746  GLUCAP 117* 131*   Recent Results (from the past 240 hour(s))  Urine culture     Status: Abnormal   Collection Time: 07/04/16  2:00 PM  Result Value Ref Range Status   Specimen Description URINE, CLEAN CATCH  Final   Special Requests NONE  Final   Culture 60,000 COLONIES/mL ENTEROCOCCUS FAECALIS (A)  Final   Report Status 07/06/2016 FINAL  Final   Organism ID, Bacteria ENTEROCOCCUS FAECALIS (A)  Final      Susceptibility   Enterococcus faecalis - MIC*    AMPICILLIN <=2 SENSITIVE Sensitive     LEVOFLOXACIN 0.5 SENSITIVE Sensitive     NITROFURANTOIN <=16 SENSITIVE Sensitive     VANCOMYCIN 1 SENSITIVE Sensitive     * 60,000 COLONIES/mL ENTEROCOCCUS FAECALIS  Blood culture (routine x 2)     Status: None (Preliminary result)   Collection Time:  07/04/16  2:39 PM  Result Value Ref Range Status   Specimen Description BLOOD LEFT HAND  Final   Special Requests BOTTLES DRAWN AEROBIC ONLY 5 CC  Final   Culture   Final    NO GROWTH 2 DAYS Performed at [facility]    Report Status PENDING  Incomplete  Blood culture (routine x 2)     Status: None (Preliminary result)   Collection Time: 07/04/16  2:54 PM  Result Value Ref Range Status   Specimen Description BLOOD RIGHT ANTECUBITAL  Final   Special Requests BOTTLES DRAWN AEROBIC AND ANAEROBIC 5 CC EA  Final   Culture   Final    NO GROWTH 2 DAYS Performed at [facility]    Report Status PENDING  Incomplete  MRSA PCR Screening     Status: None   Collection Time: 07/06/16 10:07 PM  Result Value Ref Range Status   MRSA by PCR NEGATIVE NEGATIVE Final    Comment:        The GeneXpert MRSA Assay (FDA approved for NASAL specimens only), is one component of a comprehensive MRSA colonization surveillance program. It is not intended to diagnose MRSA infection nor to guide or monitor treatment for MRSA infections.     Studies: No results found. Scheduled Meds: . B-complex with vitamin C  1 tablet Oral Daily  . DULoxetine  60 mg Oral BID  . [START ON 07/08/2016] enoxaparin (LOVENOX) injection  40 mg Subcutaneous Q24H  . fentaNYL      . fludrocortisone  0.1 mg Oral Daily  . folic acid  1 mg Oral Daily  . gabapentin  1,200 mg Oral Daily  . gabapentin  1,600 mg Oral QHS  . lacosamide  100 mg Oral BID  . pantoprazole  40 mg Oral Daily  . predniSONE  5 mg Oral Daily  . topiramate  50 mg Oral QHS  . traZODone  50 mg Oral QHS  . vitamin B-12  1,000 mcg Oral Daily  . Vitamin D (Ergocalciferol)  50,000 Units Oral Q7 days   Continuous Infusions: . sodium chloride 75 mL/hr at 07/06/16 1231  . lactated ringers 100 mL/hr at 07/07/16 1049   Principal Problem:   Fever Active Problems:   Wegner's disease (  congenital syphilitic osteochondritis)   Adrenal insufficiency  (HCC)   OSA (obstructive sleep apnea)   Peripheral neuropathy (HCC)   Hypotension   Rash   CKD (chronic kidney disease) stage 3, GFR 30-59 ml/min   Fever of unknown origin  Time spent:   Standley Dakins, MD, FAAFP [facility] Pager [phone]  If 7PM-7AM, please contact night-coverage [email or web address] Password [email or web address] 07/07/2016, 12:54 PM    LOS: 2 days

## 2016-07-07 NOTE — Addendum Note (Signed)
Addendum  created 07/07/16 1156 by Jhonnie GarnerBeth M Elaine [name], CRNA   Charge Capture section accepted, Visit diagnoses modified

## 2016-07-07 NOTE — Progress Notes (Signed)
[facility]    Date of Admission:  07/04/2016             Principal Problem:   Fever Active Problems:   Hypotension   Rash   Wegner's disease (congenital syphilitic osteochondritis)   Adrenal insufficiency (HCC)   OSA (obstructive sleep apnea)   Peripheral neuropathy (HCC)   CKD (chronic kidney disease) stage 3, GFR 30-59 ml/min   Fever of unknown origin   . B-complex with vitamin C  1 tablet Oral Daily  . DULoxetine  60 mg Oral BID  . [START ON 07/08/2016] enoxaparin (LOVENOX) injection  40 mg Subcutaneous Q24H  . fentaNYL      . fludrocortisone  0.1 mg Oral Daily  . folic acid  1 mg Oral Daily  . gabapentin  1,200 mg Oral Daily  . gabapentin  1,600 mg Oral QHS  . lacosamide  100 mg Oral BID  . pantoprazole  40 mg Oral Daily  . predniSONE  5 mg Oral Daily  . topiramate  50 mg Oral QHS  . traZODone  50 mg Oral QHS  . vitamin B-12  1,000 mcg Oral Daily  . Vitamin D (Ergocalciferol)  50,000 Units Oral Q7 days    SUBJECTIVE: She has not feeling any better or worse. She has had sweats all day long associated with persistent high fevers. She underwent biopsy of 2 skin lesions on her left thigh today.  Review of Systems: Review of Systems  Constitutional: Positive for chills, diaphoresis, fever and malaise/fatigue. Negative for weight loss.  HENT: Negative for sore throat.   Eyes: Negative for redness.  Respiratory: Positive for cough. Negative for sputum production and shortness of breath.        She now believes that her cough is compatible with her chronic Wegener's associated cough.  Cardiovascular: Negative for chest pain.  Gastrointestinal: Negative for abdominal pain, diarrhea, nausea and vomiting.  Genitourinary: Negative for dysuria, frequency and urgency.  Musculoskeletal: Positive for joint pain and myalgias.  Skin: Positive for rash. Negative for itching.  Neurological: Positive for headaches.    Past Medical History:    Diagnosis Date  . Chronic cough   . Diabetes mellitus without complication (HCC)   . Hypertension   . OSA (obstructive sleep apnea)   . Peripheral neuropathy (HCC)   . Wegener's granulomatosis (HCC) 2009  . Wegner's disease (congenital syphilitic osteochondritis)     Social History  Substance Use Topics  . Smoking status: Never Smoker  . Smokeless tobacco: Never Used  . Alcohol use Yes     Comment: occasionally    Family History  Problem Relation Age of Onset  . Diabetes Mother   . Heart disease Mother   . Diabetes Father    Allergies  Allergen Reactions  . Motrin [Ibuprofen] Other (See Comments)    Pt is unable to take this due to kidney problems.    . Penicillins Rash and Other (See Comments)    Has patient had a PCN reaction causing immediate rash, facial/tongue/throat swelling, SOB or lightheadedness with hypotension: No Has patient had a PCN reaction causing severe rash involving mucus membranes or skin necrosis: No Has patient had a PCN reaction that required hospitalization No Has patient had a PCN reaction occurring within the last 10 years: No If all of the above answers are "NO", then may proceed with Cephalosporin use.    OBJECTIVE: Vitals:   07/07/16 1000 07/07/16 1012 07/07/16  1115 07/07/16 1219  BP: 99/65 102/61 111/68 110/65  Pulse: 75  74 79  Resp: 20 18 18 19   Temp:  99 F (37.2 C) 98.8 F (37.1 C) 98.2 F (36.8 C)  TempSrc:   Oral Oral  SpO2: 100% 95% 94% 98%  Weight:      Height:       Body mass index is 37.9 kg/m.  Physical Exam  Constitutional: She is oriented to person, place, and time.  She is alert and resting quietly in bed. She is covered in blankets. Her husband is at the bedside.  HENT:  Mouth/Throat: No oropharyngeal exudate.  Eyes: Conjunctivae are normal.  She has some new, slight conjunctival redness on the left.  Neck: Neck supple.  Cardiovascular: Normal rate and regular rhythm.   No murmur heard. Pulmonary/Chest:  Effort normal and breath sounds normal. She has no wheezes. She has no rales.  Abdominal: Soft. There is no tenderness.  Musculoskeletal: Normal range of motion. She exhibits no edema or tenderness.  Neurological: She is alert and oriented to person, place, and time.  Skin: Rash noted. There is erythema.  Psychiatric: Mood and affect normal.    Lab Results Lab Results  Component Value Date   WBC 9.2 07/07/2016   HGB 11.2 (L) 07/07/2016   HCT 34.4 (L) 07/07/2016   MCV 89.8 07/07/2016   PLT 236 07/07/2016    Lab Results  Component Value Date   CREATININE 1.03 (H) 07/07/2016   BUN 6 07/07/2016   NA 136 07/07/2016   K 3.3 (L) 07/07/2016   CL 109 07/07/2016   CO2 21 (L) 07/07/2016    Lab Results  Component Value Date   ALT 30 07/05/2016   AST 41 07/05/2016   ALKPHOS 104 07/05/2016   BILITOT 0.2 (L) 07/05/2016    Serum cryptococcal antigen 07/06/2016: Negative  Microbiology: Recent Results (from the past 240 hour(s))  Urine culture     Status: Abnormal   Collection Time: 07/04/16  2:00 PM  Result Value Ref Range Status   Specimen Description URINE, CLEAN CATCH  Final   Special Requests NONE  Final   Culture 60,000 COLONIES/mL ENTEROCOCCUS FAECALIS (A)  Final   Report Status 07/06/2016 FINAL  Final   Organism ID, Bacteria ENTEROCOCCUS FAECALIS (A)  Final      Susceptibility   Enterococcus faecalis - MIC*    AMPICILLIN <=2 SENSITIVE Sensitive     LEVOFLOXACIN 0.5 SENSITIVE Sensitive     NITROFURANTOIN <=16 SENSITIVE Sensitive     VANCOMYCIN 1 SENSITIVE Sensitive     * 60,000 COLONIES/mL ENTEROCOCCUS FAECALIS  Blood culture (routine x 2)     Status: None (Preliminary result)   Collection Time: 07/04/16  2:39 PM  Result Value Ref Range Status   Specimen Description BLOOD LEFT HAND  Final   Special Requests BOTTLES DRAWN AEROBIC ONLY 5 CC  Final   Culture   Final    NO GROWTH 2 DAYS Performed at [facility]    Report Status PENDING  Incomplete  Blood culture  (routine x 2)     Status: None (Preliminary result)   Collection Time: 07/04/16  2:54 PM  Result Value Ref Range Status   Specimen Description BLOOD RIGHT ANTECUBITAL  Final   Special Requests BOTTLES DRAWN AEROBIC AND ANAEROBIC 5 CC EA  Final   Culture   Final    NO GROWTH 2 DAYS Performed at [facility]    Report Status PENDING  Incomplete  MRSA PCR Screening     Status: None   Collection Time: 07/06/16 10:07 PM  Result Value Ref Range Status   MRSA by PCR NEGATIVE NEGATIVE Final    Comment:        The GeneXpert MRSA Assay (FDA approved for NASAL specimens only), is one component of a comprehensive MRSA colonization surveillance program. It is not intended to diagnose MRSA infection nor to guide or monitor treatment for MRSA infections.      ASSESSMENT: There is still no obvious cause for her fever and rash. Some of her lesions are fading but  It does appear that she has some new lesions.  She has some small lesions on her palms and soles. These do not have the typical target appearance of erythema multiforme. They are papular and somewhat tender to touch. This does not really look like secondary syphilis and she has no known risk factors but an RPR is pending. Hopefully skin biopsy will shed some light on what is going on.  PLAN: 1. Observe off of antibiotics 2. Await results of skin biopsy and RPR  Michel Bickers, MD [facility] [place] [phone] pager   [phone] cell

## 2016-07-07 NOTE — Progress Notes (Signed)
General Surgery:  No new problems overnight Continues to spike fevers up to 103 Does not look toxic.  Alert and pleasant  Plan unroofed small left hip abscess and excisional biopsy skin lesion left thigh in OR this morning  Unity Luepke M. Derrell LollingIngram, M.D., [facility]ACS [facility] General and Minimally invasive Surgery Breast and Colorectal Surgery Office:   [phone] Pager:   [phone]

## 2016-07-07 NOTE — Interval H&P Note (Signed)
History and Physical Interval Note:  07/07/2016 8:21 AM  [name]  has presented today for surgery, with the diagnosis of left hip abcess  The various methods of treatment have been discussed with the patient and family. After consideration of risks, benefits and other options for treatment, the patient has consented to  Procedure(s): INCISION AND DRAINAGE LEFT HIP  ABSCESS (Left),  excisional biopsy left thigh skin lesion as a surgical intervention .  The patient's history has been reviewed, patient examined, no change in status, stable  for surgery.  I have reviewed the patient's chart and labs.  Questions were answered to the patient's satisfaction.     Ernestene MentionINGRAM,Bodin Gorka M

## 2016-07-07 NOTE — Transfer of Care (Signed)
Immediate Anesthesia Transfer of Care Note  Patient: Stacey CromerLisa [name]  Procedure(s) Performed: Procedure(s): INCISION AND DRAINAGE LEFT HIP  ABSCESS (Left)  Patient Location: PACU  Anesthesia Type:MAC  Level of Consciousness:  sedated, patient cooperative and responds to stimulation  Airway & Oxygen Therapy:Patient Spontanous Breathing and Patient connected to face mask oxgen  Post-op Assessment:  Report given to PACU RN and Post -op Vital signs reviewed and stable  Post vital signs:  Reviewed and stable  Last Vitals:  Vitals:   07/07/16 0518 07/07/16 0737  BP: 111/62   Pulse: 94   Resp: 18   Temp: (!) 39.4 C (!) 39.2 C    Complications: No apparent anesthesia complications

## 2016-07-07 NOTE — H&P (View-Only) (Signed)
General Surgery:  No new problems overnight Continues to spike fevers up to 103 Does not look toxic.  Alert and pleasant  Plan unroofed small left hip abscess and excisional biopsy skin lesion left thigh in OR this morning  Smera Guyette M. Saphira Lahmann, M.D., FACS [facility] General and Minimally invasive Surgery Breast and Colorectal Surgery Office:   [phone] Pager:   [phone]   

## 2016-07-07 NOTE — Op Note (Signed)
Patient Name:           Stacey CromerLisa [name]   Date of Surgery:        07/07/2016  Pre op Diagnosis:      Fever of unknown origin, acute skin rash, abscess left hip  Post op Diagnosis:    Same  Procedure:                 Excision 1 cm skin lesion left distal thigh                                      Debridement abscess left hip  Surgeon:                     Angelia MouldHaywood M. Derrell LollingIngram, M.D., FACS  Assistant:                      OR staff  Operative Indications:   This is a 50 year old Caucasian female with history of Wegener's granulomatosis on low-dose prednisone and methotrexate, adrenal insufficiency on supplementation, hypertension.  She was recently admitted with fevers of 102 and 103 and acute skin rash for 4 days.  She is being managed by internal medicine infectious disease.  Source of infection has not been identified.  The infectious disease physician asked that I debrided the left hip abscess and excise one of the skin lesions for histology.  Exam reveals a flat patchy rash throughout the proximal thighs, thorax and face.  Does not look infected.  There is a tiny healing abscess of the left hip area laterally.  About 2.5 cm diameter.  Not draining and not fluctuant.  Small eschar on top.  We chose to excise and unroofed this is well  Operative Findings:       Skin lesions left lateral thigh as described above  Procedure in Detail:          The patient brought to the operating room.  She was sedated and monitored by the anesthesia department  The left anterolateral thigh was prepped and draped in a sterile fashion.  Surgical timeout was performed.  0.5% Marcaine with epinephrine and bicarbonate was used as local infiltration anesthetic for both lesions.     In the distal left lateral thigh identified the area of skin rash to 1 to excise.  I made a longitudinal elliptical incision about 1.75 cm long by about 1.25 cm wide.  I completely excise a skin lesion including the dermis and a little bit of  subcutaneous fat.  This was sent to do pathology.  Hemostasis excellent.  The skin was closed with 2 interrupted sutures of 3-0 nylon.     I then identified the proximal skin lesion which looks like a dried up abscess.  I made a longitudinal elliptical incision about 2.5 -3 cm long by about 1.5 cm wide.  I did not encounter any purulence.  It appeared that I got down to the base of the infection.  Specimen was sent to pathology.  Hemostasis excellent.  This wound was packed with iodoform gauze.  Both incisions were covered with dry gauze and Tegaderm.  The patient tolerated the procedure well was taken to PACU in stable condition.  EBL 5 mL.  Counts correct.  Complications none.     Angelia MouldHaywood M. Derrell LollingIngram, M.D., FACS General and Minimally Invasive Surgery Breast and Colorectal Surgery  07/07/2016 9:23 AM

## 2016-07-07 NOTE — Progress Notes (Signed)
Date:  July 07, 2016 Chart reviewed for concurrent status and case management needs. Will continue to follow the patient for status change:  Temp 103/iv abx/ positive urine for bacteria, awaiting bld cultures Discharge Planning: following for needs Expected discharge date: [id number] Marcelle SmilingRhonda Bowyn Mercier, BSN, [place]3, [place]CCM   [phone]

## 2016-07-07 NOTE — Anesthesia Postprocedure Evaluation (Signed)
Anesthesia Post Note  Patient: Stacey CromerLisa [name]  Procedure(s) Performed: Procedure(s) (LRB): DEBRIDMENT LEFT THIGH ABSCESS, EXISION ACUTE SKIN RASH LEFT THIGH(1CM LESION) (Left)  Patient location during evaluation: PACU Anesthesia Type: MAC Level of consciousness: awake and alert Pain management: pain level controlled Vital Signs Assessment: post-procedure vital signs reviewed and stable Respiratory status: spontaneous breathing, nonlabored ventilation, respiratory function stable and patient connected to nasal cannula oxygen Cardiovascular status: stable and blood pressure returned to baseline Anesthetic complications: no    Last Vitals:  Vitals:   07/07/16 0950 07/07/16 1000  BP:  99/65  Pulse: 73 75  Resp: (!) 22 20  Temp:      Last Pain:  Vitals:   07/07/16 0950  TempSrc:   PainSc: 4                  Madeleyn Schwimmer JENNETTE

## 2016-07-08 LAB — COMPREHENSIVE METABOLIC PANEL
ALBUMIN: 2.8 g/dL — AB (ref 3.5–5.0)
ALK PHOS: 136 U/L — AB (ref 38–126)
ALT: 47 U/L (ref 14–54)
ANION GAP: 6 (ref 5–15)
AST: 52 U/L — AB (ref 15–41)
BUN: 8 mg/dL (ref 6–20)
CALCIUM: 8.7 mg/dL — AB (ref 8.9–10.3)
CO2: 21 mmol/L — AB (ref 22–32)
Chloride: 115 mmol/L — ABNORMAL HIGH (ref 101–111)
Creatinine, Ser: 0.81 mg/dL (ref 0.44–1.00)
GFR calc Af Amer: >60 mL/min (ref >60)
GFR calc non Af Amer: >60 mL/min (ref >60)
Glucose, Bld: 136 mg/dL — ABNORMAL HIGH (ref 65–99)
Potassium: 3.5 mmol/L (ref 3.5–5.1)
SODIUM: 142 mmol/L (ref 135–145)
Total Bilirubin: 0.4 mg/dL (ref 0.3–1.2)
Total Protein: 6 g/dL — ABNORMAL LOW (ref 6.5–8.1)

## 2016-07-08 LAB — CBC WITH DIFFERENTIAL/PLATELET
BASOS ABS: 0.1 10*3/uL (ref 0.0–0.1)
Basophils Relative: 1 %
Eosinophils Absolute: 0 10*3/uL (ref 0.0–0.7)
Eosinophils Relative: 0 %
HCT: 34.1 % — ABNORMAL LOW (ref 36.0–46.0)
HEMOGLOBIN: 11.4 g/dL — AB (ref 12.0–15.0)
LYMPHS ABS: 3 10*3/uL (ref 0.7–4.0)
LYMPHS PCT: 40 %
MCH: 29.6 pg (ref 26.0–34.0)
MCHC: 33.4 g/dL (ref 30.0–36.0)
MCV: 88.6 fL (ref 78.0–100.0)
MONOS PCT: 6 %
Monocytes Absolute: 0.5 10*3/uL (ref 0.1–1.0)
NEUTROS PCT: 53 %
Neutro Abs: 4 10*3/uL (ref 1.7–7.7)
Platelets: 244 10*3/uL (ref 150–400)
RBC: 3.85 MIL/uL — AB (ref 3.87–5.11)
RDW: 15.4 % (ref 11.5–15.5)
WBC: 7.6 10*3/uL (ref 4.0–10.5)

## 2016-07-08 LAB — RPR: RPR: NONREACTIVE

## 2016-07-08 LAB — GLUCOSE, CAPILLARY: Glucose-Capillary: 111 mg/dL — ABNORMAL HIGH (ref 65–99)

## 2016-07-08 MED ORDER — HYDROMORPHONE HCL 1 MG/ML IJ SOLN
0.5000 mg | INTRAMUSCULAR | Status: DC | PRN
  Administered 2016-07-09 – 2016-07-10 (×2): 0.5 mg via INTRAVENOUS
  Filled 2016-07-08 (×2): qty 1

## 2016-07-08 NOTE — Progress Notes (Signed)
PROGRESS NOTE   [name]  NWG:[id number]  DOB: Mar 14, 1966  DOA: 07/04/2016 PCP: No PCP Per Patient  Hospital course: [name] is a 50 y.o. 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 She has been seen by primary care physician who referred her to emergency room for evaluation. Patient reported that rash started initially on lower extremities and then spread to her trunk. She reports no exposure to ticks. No reports of IV drug abuse. She did not know what the maximum temperature was but her fever has been intermittent and worse over past 24 hours. No reports of chest pain or shortness of breath. No cough.  No abdominal pain, nausea, vomiting or diarrhea.  Assessment & Plan:     Fever / Generalized maculopapular rash / Hypotension - Patient presented with fever, generalized painful rash and hypotension. She she has required IV fluids on the admission which has temporarily normalize her blood pressure to 108/73 but her current BP again 91/57.  Patient has history of adrenal insufficiency and is on Florinef as well as low dose prednisone, which will continue. - Presentation with fever and generalized rash is worrisome for acute infectious process, - Seen by infectious disease who recommended vancomycin, discontinued on 9/14.  - Follow-up blood culture results: No growth to date, repeat blood cultures done 9/14: NGTD - Urine culture: enterococcus positive sensitive to vancomycin - Hypotension - resolved now. - general surgery for skin biopsy and I&D small abscess done 9/15.   - will order incentive spirometry and ambulate today    Wegner's disease (congenital syphilitic osteochondritis) - Continue low-dose prednisone  Small abscess left side  -s/p I&D in OR 9/15    Adrenal insufficiency (HCC) - Continue Florinef, Methotrexate  Fever Unknown Cause - Blood cultures repeated 9/14 No growth to date, improving - Tylenol as needed  Enterococcus UTI  Pt was treated with vancomycin  Chronic Pain - Pt has implanted pain pump for intrathecal prialt administration (placed Aug 2017) by Dr. Elberta LeatherwoodQadri ([facility]) - I spoke with Dr Jeanice LimQuadri 9/14.   He is available to be contacted by cell phone [phone]    OSA (obstructive sleep apnea) - Stable respiratory status     Peripheral neuropathy (HCC) - Continue gabapentin    Essential hypertension - Blood pressure medications on hold for now, BPs have been well controlled and stable.     CKD (chronic kidney disease) stage 3, GFR 30-59 ml/min - Baseline creatinine about 3 years ago is 1.14 - Creatinine on this admission is 1.3.  We will hold off on Lasix as this could have contributed to worsening renal function   DVT prophylaxis: SCDs bilaterally  Code Status: full code  Family Communication: husband at the bedside   Disposition Plan: medical floor  Consults called: ID, Dr. Cliffton Astersampbell John, general surgery called 9/14 Admission status: inpatient admission. Patient was hypotensive on the admission. She reported intermittent fevers and painful rash for last 4 days prior to this admission. She has also seen primary care physician who referred the patient for further evaluation to ED. Patient requires inpatient workup and treatment tincluding blood cultures, vancomycin. I anticipate she will require at least 2-3 days in the hospital specifically the time length for blood culture results. We consulted infectious disease who also recognizes the importance of admission and inpatient evaluation.  Antimicrobials: Anti-infectives    Start     Dose/Rate Route Frequency Ordered Stop   07/05/16 1600  vancomycin (VANCOCIN) 1,250 mg in sodium chloride 0.9 % 250 mL IVPB  Status:  Discontinued     1,250  mg 166.7 mL/hr over 90 Minutes Intravenous Every 24 hours 07/04/16 1627 07/05/16 0828   07/05/16 0900  vancomycin (VANCOCIN) IVPB 750 mg/150 ml premix  Status:  Discontinued     750 mg 150 mL/hr over 60 Minutes Intravenous Every 12 hours 07/05/16 0828 07/06/16 1627   07/04/16 1630  vancomycin (VANCOCIN) 1,500 mg in sodium chloride 0.9 % 500 mL IVPB     1,500 mg 250 mL/hr over 120 Minutes Intravenous  Once 07/04/16 1618 07/04/16 1903      Subjective: Pt says fever getting better.   Objective: Vitals:   07/07/16 1219 07/07/16 2142 07/08/16 0524 07/08/16 1202  BP: 110/65 122/70 112/67   Pulse: 79 67 65   Resp: 19 19 19    Temp: 98.2 F (36.8 C) 97.5 F (36.4 C) 97.5 F (36.4 C) (!) 100.4 F (38 C)  TempSrc: Oral Oral Oral Oral  SpO2: 98% 97% 99%   Weight:      Height:        Intake/Output Summary (Last 24 hours) at 07/08/16 1228 Last data filed at 07/08/16 0745  Gross per 24 hour  Intake              875 ml  Output                0 ml  Net              875 ml   Filed Weights   07/04/16 1143 07/04/16 1234 07/04/16 1838  Weight: 103.9 kg (229 lb) 103.9 kg (229 lb) 106.5 kg (234 lb 12.6 oz)   Exam:  Eyes: PERRL, lids and conjunctivae normal ENMT: Mucous membranes are moist. Posterior pharynx clear of any exudate or lesions.Normal dentition.  Neck: normal, supple, no masses, no thyromegaly Respiratory: clear to auscultation bilaterally, no wheezing, no crackles. Normal respiratory effort. No accessory muscle use.  Cardiovascular: Regular rate and rhythm, no murmurs / rubs / gallops. No extremity edema. 2+ pedal pulses. No carotid bruits.  Abdomen: no tenderness, no masses palpated. No hepatosplenomegaly. Bowel sounds positive.  Musculoskeletal: no clubbing / cyanosis. No joint deformity upper and lower extremities. Good ROM, no contractures. Normal muscle tone.  Skin: sporadic macular rash over trunk, lower extremities. Skin is warm and dry. Small abscess 4 cm diameter  fluctuant left side near hip erythematous, central scab noted, tender to palpation Neurologic: CN 2-12 grossly intact. Sensation intact, DTR normal. Strength 5/5 in all 4.  Psychiatric: Normal judgment and insight. Alert and oriented x 3. Normal mood.   Data Reviewed: Basic Metabolic Panel:  Recent Labs Lab 07/04/16 1444 07/05/16 0606 07/07/16 0539 07/08/16 0534  NA 135 140 136 142  K 3.7 4.2 3.3* 3.5  CL 106 113* 109 115*  CO2 21* 21* 21* 21*  GLUCOSE 90 100* 100* 136*  BUN 15 10 6 8   CREATININE 1.39* 1.09* 1.03* 0.81  CALCIUM 8.9 8.3* 7.9* 8.7*   Liver Function Tests:  Recent Labs Lab 07/05/16 0606 07/08/16 0534  AST 41 52*  ALT 30 47  ALKPHOS 104 136*  BILITOT 0.2* 0.4  PROT 6.0* 6.0*  ALBUMIN 3.0* 2.8*   No results for input(s): LIPASE, AMYLASE in the last 168 hours. No results for input(s): AMMONIA in the last  168 hours. CBC:  Recent Labs Lab 07/04/16 1444 07/05/16 0606 07/07/16 1130 07/08/16 0534  WBC 7.7 6.6 9.2 7.6  NEUTROABS 3.6  --   --  4.0  HGB 14.4 12.5 11.2* 11.4*  HCT 44.3 38.8 34.4* 34.1*  MCV 91.7 92.8 89.8 88.6  PLT 303 245 236 244   Cardiac Enzymes: No results for input(s): CKTOTAL, CKMB, CKMBINDEX, TROPONINI in the last 168 hours. CBG (last 3)   Recent Labs  07/06/16 0746 07/08/16 0807  GLUCAP 131* 111*   Recent Results (from the past 240 hour(s))  Urine culture     Status: Abnormal   Collection Time: 07/04/16  2:00 PM  Result Value Ref Range Status   Specimen Description URINE, CLEAN CATCH  Final   Special Requests NONE  Final   Culture 60,000 COLONIES/mL ENTEROCOCCUS FAECALIS (A)  Final   Report Status 07/06/2016 FINAL  Final   Organism ID, Bacteria ENTEROCOCCUS FAECALIS (A)  Final      Susceptibility   Enterococcus faecalis - MIC*    AMPICILLIN <=2 SENSITIVE Sensitive     LEVOFLOXACIN 0.5 SENSITIVE Sensitive     NITROFURANTOIN <=16 SENSITIVE Sensitive     VANCOMYCIN 1 SENSITIVE Sensitive     * 60,000 COLONIES/mL  ENTEROCOCCUS FAECALIS  Blood culture (routine x 2)     Status: None (Preliminary result)   Collection Time: 07/04/16  2:39 PM  Result Value Ref Range Status   Specimen Description BLOOD LEFT HAND  Final   Special Requests BOTTLES DRAWN AEROBIC ONLY 5 CC  Final   Culture   Final    NO GROWTH 4 DAYS Performed at [facility]    Report Status PENDING  Incomplete  Blood culture (routine x 2)     Status: None (Preliminary result)   Collection Time: 07/04/16  2:54 PM  Result Value Ref Range Status   Specimen Description BLOOD RIGHT ANTECUBITAL  Final   Special Requests BOTTLES DRAWN AEROBIC AND ANAEROBIC 5 CC EA  Final   Culture   Final    NO GROWTH 4 DAYS Performed at [facility]    Report Status PENDING  Incomplete  MRSA PCR Screening     Status: None   Collection Time: 07/06/16 10:07 PM  Result Value Ref Range Status   MRSA by PCR NEGATIVE NEGATIVE Final    Comment:        The GeneXpert MRSA Assay (FDA approved for NASAL specimens only), is one component of a comprehensive MRSA colonization surveillance program. It is not intended to diagnose MRSA infection nor to guide or monitor treatment for MRSA infections.   Culture, blood (routine x 2)     Status: None (Preliminary result)   Collection Time: 07/07/16  7:50 AM  Result Value Ref Range Status   Specimen Description BLOOD RIGHT HAND  Final   Special Requests BOTTLES DRAWN AEROBIC ONLY 5CC  Final   Culture   Final    NO GROWTH 1 DAY Performed at [facility]    Report Status PENDING  Incomplete  Culture, blood (routine x 2)     Status: None (Preliminary result)   Collection Time: 07/07/16  7:50 AM  Result Value Ref Range Status   Specimen Description BLOOD RIGHT ARM  Final   Special Requests BOTTLES DRAWN AEROBIC ONLY 5CC  Final   Culture   Final    NO GROWTH 1 DAY Performed at [facility]    Report Status PENDING  Incomplete  Studies: No results found. Scheduled Meds: .  B-complex with vitamin C  1 tablet Oral Daily  . DULoxetine  60 mg Oral BID  . enoxaparin (LOVENOX) injection  40 mg Subcutaneous Q24H  . fludrocortisone  0.1 mg Oral Daily  . folic acid  1 mg Oral Daily  . gabapentin  1,200 mg Oral Daily  . gabapentin  1,600 mg Oral QHS  . lacosamide  100 mg Oral BID  . pantoprazole  40 mg Oral Daily  . predniSONE  5 mg Oral Daily  . topiramate  50 mg Oral QHS  . traZODone  50 mg Oral QHS  . vitamin B-12  1,000 mcg Oral Daily  . Vitamin D (Ergocalciferol)  50,000 Units Oral Q7 days   Continuous Infusions: . sodium chloride 75 mL/hr at 07/07/16 1906  . lactated ringers 100 mL/hr at 07/07/16 1710   Principal Problem:   Fever Active Problems:   Wegner's disease (congenital syphilitic osteochondritis)   Adrenal insufficiency (HCC)   OSA (obstructive sleep apnea)   Peripheral neuropathy (HCC)   Hypotension   Rash   CKD (chronic kidney disease) stage 3, GFR 30-59 ml/min   Fever of unknown origin  Time spent:   Standley Dakins, MD, FAAFP [facility] Pager [phone]  If 7PM-7AM, please contact night-coverage [email or web address] Password [email or web address] 07/08/2016, 12:28 PM    LOS: 3 days

## 2016-07-08 NOTE — Progress Notes (Signed)
Patient ID: Stacey CromerLisa Jefferys, female   DOB: [birth year], 50 y.o.   MRN: [id number] No complaints Open wound healthy, skin biopsy site clean without infection Dry dressing changes, (not much to pack) May shower Will follow up Monday unless needed sooner

## 2016-07-09 ENCOUNTER — Inpatient Hospital Stay (HOSPITAL_COMMUNITY): Payer: BC Managed Care – PPO

## 2016-07-09 ENCOUNTER — Encounter (HOSPITAL_COMMUNITY): Payer: Self-pay | Admitting: Radiology

## 2016-07-09 DIAGNOSIS — E876 Hypokalemia: Secondary | ICD-10-CM

## 2016-07-09 LAB — CBC
HEMATOCRIT: 35.4 % — AB (ref 36.0–46.0)
Hemoglobin: 11.3 g/dL — ABNORMAL LOW (ref 12.0–15.0)
MCH: 29.4 pg (ref 26.0–34.0)
MCHC: 31.9 g/dL (ref 30.0–36.0)
MCV: 92.2 fL (ref 78.0–100.0)
Platelets: 270 10*3/uL (ref 150–400)
RBC: 3.84 MIL/uL — AB (ref 3.87–5.11)
RDW: 16.1 % — ABNORMAL HIGH (ref 11.5–15.5)
WBC: 14.1 10*3/uL — AB (ref 4.0–10.5)

## 2016-07-09 LAB — CULTURE, BLOOD (ROUTINE X 2)
CULTURE: NO GROWTH
CULTURE: NO GROWTH

## 2016-07-09 LAB — BASIC METABOLIC PANEL
Anion gap: 7 (ref 5–15)
BUN: 8 mg/dL (ref 6–20)
CHLORIDE: 106 mmol/L (ref 101–111)
CO2: 23 mmol/L (ref 22–32)
Calcium: 8.1 mg/dL — ABNORMAL LOW (ref 8.9–10.3)
Creatinine, Ser: 1.05 mg/dL — ABNORMAL HIGH (ref 0.44–1.00)
GFR calc non Af Amer: >60 mL/min (ref >60)
Glucose, Bld: 110 mg/dL — ABNORMAL HIGH (ref 65–99)
POTASSIUM: 3.1 mmol/L — AB (ref 3.5–5.1)
SODIUM: 136 mmol/L (ref 135–145)

## 2016-07-09 LAB — DIFFERENTIAL
BASOS ABS: 0 10*3/uL (ref 0.0–0.1)
BLASTS: 0 %
Band Neutrophils: 0 %
Basophils Relative: 0 %
EOS ABS: 0 10*3/uL (ref 0.0–0.7)
Eosinophils Relative: 0 %
LYMPHS PCT: 57 %
Lymphs Abs: 8 10*3/uL — ABNORMAL HIGH (ref 0.7–4.0)
MONO ABS: 0.6 10*3/uL (ref 0.1–1.0)
MONOS PCT: 4 %
Metamyelocytes Relative: 0 %
Myelocytes: 0 %
Neutro Abs: 5.5 10*3/uL (ref 1.7–7.7)
Neutrophils Relative %: 39 %
Other: 0 %
PROMYELOCYTES ABS: 0 %
nRBC: 0 /100 WBC

## 2016-07-09 LAB — SEDIMENTATION RATE: Sed Rate: 30 mm/hr — ABNORMAL HIGH (ref 0–22)

## 2016-07-09 MED ORDER — POTASSIUM CHLORIDE CRYS ER 20 MEQ PO TBCR
40.0000 meq | EXTENDED_RELEASE_TABLET | Freq: Two times a day (BID) | ORAL | Status: AC
  Administered 2016-07-09 (×2): 40 meq via ORAL
  Filled 2016-07-09 (×2): qty 2

## 2016-07-09 MED ORDER — DM-GUAIFENESIN ER 30-600 MG PO TB12
1.0000 | ORAL_TABLET | Freq: Two times a day (BID) | ORAL | Status: DC | PRN
  Administered 2016-07-09: 1 via ORAL
  Filled 2016-07-09: qty 1

## 2016-07-09 MED ORDER — IOPAMIDOL (ISOVUE-300) INJECTION 61%
30.0000 mL | Freq: Once | INTRAVENOUS | Status: AC | PRN
  Administered 2016-07-09: 30 mL via ORAL

## 2016-07-09 MED ORDER — LIDOCAINE 5 % EX PTCH
1.0000 | MEDICATED_PATCH | CUTANEOUS | Status: DC
  Administered 2016-07-09: 1 via TRANSDERMAL
  Filled 2016-07-09 (×6): qty 1

## 2016-07-09 MED ORDER — IOPAMIDOL (ISOVUE-300) INJECTION 61%
100.0000 mL | Freq: Once | INTRAVENOUS | Status: AC | PRN
  Administered 2016-07-09: 100 mL via INTRAVENOUS

## 2016-07-09 NOTE — Progress Notes (Signed)
Temp still elevated, cool cloth given. Tylenol given at 2336pm. Will recheck again in 30 min and monitor closely. Patient alert and oriented. Still complains of pain, prn offered as ordered.

## 2016-07-09 NOTE — Progress Notes (Signed)
Notified internal medicine physician lab called and reported corrections on patient results labwork.  The results have a "c" in the result column for Neutrophils and Promyelocytes Absolute.

## 2016-07-09 NOTE — Progress Notes (Signed)
[facility]    Date of Admission:  07/04/2016   Total days of antibiotics 3           ID: [name] is a 50 y.o. female with  Hx of wegener's granulomatosis who is admitted for fever and new onset rash Principal Problem:   Fever Active Problems:   Wegner's disease (congenital syphilitic osteochondritis)   Adrenal insufficiency (HCC)   OSA (obstructive sleep apnea)   Peripheral neuropathy (HCC)   Hypotension   Rash   CKD (chronic kidney disease) stage 3, GFR 30-59 ml/min   Fever of unknown origin    Subjective: Briefly afebrile on Friday but worsening fevers on Saturday up to 102.7-103.1. worsening myalgia, arthralgias. She underwent CT of chest and abdomen which did not show pathology concerning for abscess. She is noticing some increase in right knee pain. Still having painful rash, improved on back but unchanged for rest of torso and extremities  Medications:  . B-complex with vitamin C  1 tablet Oral Daily  . DULoxetine  60 mg Oral BID  . enoxaparin (LOVENOX) injection  40 mg Subcutaneous Q24H  . fludrocortisone  0.1 mg Oral Daily  . folic acid  1 mg Oral Daily  . gabapentin  1,200 mg Oral Daily  . gabapentin  1,600 mg Oral QHS  . lacosamide  100 mg Oral BID  . pantoprazole  40 mg Oral Daily  . potassium chloride  40 mEq Oral BID  . predniSONE  5 mg Oral Daily  . topiramate  50 mg Oral QHS  . traZODone  50 mg Oral QHS  . vitamin B-12  1,000 mcg Oral Daily  . Vitamin D (Ergocalciferol)  50,000 Units Oral Q7 days    Objective: Vital signs in last 24 hours: Temp:  [101 F (38.3 C)-103.1 F (39.5 C)] 102.2 F (39 C) (09/17 0828) Pulse Rate:  [88-95] 95 (09/17 0551) Resp:  [20] 20 (09/17 0551) BP: (118-134)/(55-62) 134/62 (09/17 0551) SpO2:  [96 %-98 %] 96 % (09/17 0551) Physical Exam  Constitutional:  oriented to person, place, and time. appears well-developed and well-nourished. No distress.  HENT: [place]/AT, PERRLA, no scleral  icterus Mouth/Throat: Oropharynx is clear and moist. No oropharyngeal exudate.  Cardiovascular: Normal rate, regular rhythm and normal heart sounds. Exam reveals no gallop and no friction rub.  No murmur heard.  Pulmonary/Chest: Effort normal and breath sounds normal. No respiratory distress.  has no wheezes.  Neck = supple, no nuchal rigidity. Tense musculature  R> L Abdominal: Soft. Bowel sounds are normal.  exhibits no distension. There is no tenderness.  Lymphadenopathy: no cervical adenopathy. No axillary adenopathy Neurological: alert and oriented to person, place, and time.  Back: left flank incision is well healed no erythema Ext: right knee is warm > L knee though no erythema or effusion Skin: Skin is warm and dry. Scattered papules Psychiatric: a normal mood and affect.  behavior is normal.    Lab Results  Recent Labs  07/08/16 0534 07/09/16 1049 07/09/16 1514  WBC 7.6  --  14.1*  HGB 11.4*  --  11.3*  HCT 34.1*  --  35.4*  NA 142 136  --   K 3.5 3.1*  --   CL 115* 106  --   CO2 21* 23  --   BUN 8 8  --   CREATININE 0.81 1.05*  --    Liver Panel  Recent Labs  07/08/16 0534  PROT 6.0*  ALBUMIN 2.8*  AST 52*  ALT 47  ALKPHOS 136*  BILITOT 0.4   No results found for: ESRSEDRATE, POCTSEDRATE  Microbiology: 9/15 blood cx ngtd 9/17 blood cx pending Studies/Results: Ct Chest W Contrast  Result Date: 07/09/2016 CLINICAL DATA:  Fever of unknown etiology. Evaluate for source of infection EXAM: CT CHEST, ABDOMEN, AND PELVIS WITH CONTRAST TECHNIQUE: Multidetector CT imaging of the chest, abdomen and pelvis was performed following the standard protocol during bolus administration of intravenous contrast. CONTRAST:  30mL ISOVUE-300 IOPAMIDOL (ISOVUE-300) INJECTION 61%, ISOVUE-300 IOPAMIDOL (ISOVUE-300) INJECTION 61% COMPARISON:  CT chest 05/30/2013 FINDINGS: CT CHEST FINDINGS Cardiovascular: No pericardial fluid. No acute findings aorta great vessels. Pulmonary  arteries are grossly normal. Mediastinum/Nodes: No mediastinal hilar adenopathy. No axillary adenopathy. Esophagus normal. Lungs/Pleura: No pulmonary infiltrate or infarction. No pleural fluid. Airways are normal. Musculoskeletal: No aggressive osseous lesion. CT ABDOMEN AND PELVIS FINDINGS Hepatobiliary: No focal hepatic lesion. Postcholecystectomy. No biliary dilatation. Mild hepatic steatosis noted. Pancreas: Pancreas is normal. No ductal dilatation. No pancreatic inflammation. Spleen: Several large splenules.  Vascular clips the splenic hilum. Adrenals/urinary tract: Adrenal glands are normal. Kidneys enhance uniformly. No obstructive uropathy. Ureters bladder normal. Adrenals normal. Stomach/Bowel: Stomach, duodenum, small bowel cecum normal. Appendix not identified. Colon And rectosigmoid colon normal Vascular/Lymphatic: Abdominal aorta is normal caliber. There is no retroperitoneal or periportal lymphadenopathy. No pelvic lymphadenopathy. Reproductive: Post hysterectomy. Other: Anterior LEFT abdominal wall. Electrode leads extend to the paraspinal L3 vertebral body level and enter the central canal coursing cephalad. No evidence of abscess or inflammation associated with generator pack on lead. Musculoskeletal: No aggressive osseous lesion. IMPRESSION: Chest Impression: 1. No evidence of pulmonary infection. Abdomen / Pelvis Impression: 1. No evidence abscess or infection in abdomen or pelvis. 2. No pyelonephritis. 3. No evidence of infection associated with generator pack or leads. Electronically Signed   By: Genevive Bi M.D.   On: 07/09/2016 14:23   Ct Abdomen Pelvis W Contrast  Result Date: 07/09/2016 CLINICAL DATA:  Fever of unknown etiology. Evaluate for source of infection EXAM: CT CHEST, ABDOMEN, AND PELVIS WITH CONTRAST TECHNIQUE: Multidetector CT imaging of the chest, abdomen and pelvis was performed following the standard protocol during bolus administration of intravenous contrast.  CONTRAST:  30mL ISOVUE-300 IOPAMIDOL (ISOVUE-300) INJECTION 61%, ISOVUE-300 IOPAMIDOL (ISOVUE-300) INJECTION 61% COMPARISON:  CT chest 05/30/2013 FINDINGS: CT CHEST FINDINGS Cardiovascular: No pericardial fluid. No acute findings aorta great vessels. Pulmonary arteries are grossly normal. Mediastinum/Nodes: No mediastinal hilar adenopathy. No axillary adenopathy. Esophagus normal. Lungs/Pleura: No pulmonary infiltrate or infarction. No pleural fluid. Airways are normal. Musculoskeletal: No aggressive osseous lesion. CT ABDOMEN AND PELVIS FINDINGS Hepatobiliary: No focal hepatic lesion. Postcholecystectomy. No biliary dilatation. Mild hepatic steatosis noted. Pancreas: Pancreas is normal. No ductal dilatation. No pancreatic inflammation. Spleen: Several large splenules.  Vascular clips the splenic hilum. Adrenals/urinary tract: Adrenal glands are normal. Kidneys enhance uniformly. No obstructive uropathy. Ureters bladder normal. Adrenals normal. Stomach/Bowel: Stomach, duodenum, small bowel cecum normal. Appendix not identified. Colon And rectosigmoid colon normal Vascular/Lymphatic: Abdominal aorta is normal caliber. There is no retroperitoneal or periportal lymphadenopathy. No pelvic lymphadenopathy. Reproductive: Post hysterectomy. Other: Anterior LEFT abdominal wall. Electrode leads extend to the paraspinal L3 vertebral body level and enter the central canal coursing cephalad. No evidence of abscess or inflammation associated with generator pack on lead. Musculoskeletal: No aggressive osseous lesion. IMPRESSION: Chest Impression: 1. No evidence of pulmonary infection. Abdomen / Pelvis Impression: 1. No evidence abscess or infection in abdomen or pelvis. 2. No pyelonephritis. 3. No  evidence of infection associated with generator pack or leads. Electronically Signed   By: Genevive BiStewart  Edmunds M.D.   On: 07/09/2016 14:23   Dg Knee Complete 4 Views Right  Result Date: 07/09/2016 CLINICAL DATA:  Pain in the  right knee x 3 days. No known injury. EXAM: RIGHT KNEE - COMPLETE 4+ VIEW COMPARISON:  None. FINDINGS: No fracture of the proximal tibia or distal femur. Patella is normal. There is spurring of the superior and inferior aspect of the patella. No joint effusion. IMPRESSION: No fracture or dislocation.  Mild osteoarthritis of the patella. Electronically Signed   By: Genevive BiStewart  Edmunds M.D.   On: 07/09/2016 11:01     Assessment/Plan: Fever of unknown origin = high suspicion this is a flare of auto-immune process with cutaneous manifestation of wegener's granulomatosis. She underwent skin biopsy, read should be available tomorrow. She underwent CT scan that did now show any fluid collection to abdominal pain pump.   We will check C-anca, PR3-MPO to see if markedly elevated. Histopathologic features of cutaneous WG can include leukocytoclastic vasculitis; they also included acneiform perifollicular and dermal granulomatous inflammation and palisaded neutrophilic and granulomatous inflammation.  No need for abtx at this time  Right neck pain = maybe contributing to headache. Will try lidocaine patch  Right knee arthralgias = could be part of constellation of presentation  Headache = previously had post procedure headache in august which improved, now having recurrent headache. Maybe worsening with fevers.  Dr Orvan Falconercampbell to see tomorrow  Spent 35 min with family  Drue SecondSNIDER, [facility] Cell: [phone] Pager: [phone]  07/09/2016, 4:07 PM

## 2016-07-09 NOTE — Progress Notes (Signed)
Callback from Dr. Selena BattenKim. Advised of patient recent temps and he states blood cultures will be repeated and continue with Tylenol as ordered and current treatment. Patient advised of plan. Encouraged use of IS every hour while awake and patient verbalized understanding. Will continue to monitor.

## 2016-07-09 NOTE — Progress Notes (Signed)
Patient temp recheck 102.9 oral. Patient resting but complains of chills. Night MD notified that temp has not gone down despite Tylenol and cool cloth.

## 2016-07-09 NOTE — Progress Notes (Signed)
Patient temp 102.1 oral. Tylenol last given at 1842pm, ordered every 6 hours. Cool cloth offered, advised patient Tylenol will be given as soon as time for it. Patient continues to have several blankets on her and asks for warm blankets. Advised to remove some blankets since she has temp. Will monitor closely.

## 2016-07-09 NOTE — Progress Notes (Signed)
PROGRESS NOTE   Stacey CromerLisa [name]  GNF:[id number]  DOB: 25-Oct-1965  DOA: 07/04/2016 PCP: No PCP Per Patient  Hospital course: Stacey CromerLisa Diodato is a 50 y.o. 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 She has been seen by primary care physician who referred her to emergency room for evaluation. Patient reported that rash started initially on lower extremities and then spread to her trunk. She reports no exposure to ticks. No reports of IV drug abuse. She did not know what the maximum temperature was but her fever has been intermittent and worse over past 24 hours. No reports of chest pain or shortness of breath. No cough.  No abdominal pain, nausea, vomiting or diarrhea.  Assessment & Plan:     Fever / Generalized maculopapular rash / Hypotension - Patient presented with fever, generalized painful rash and hypotension. She she has required IV fluids on the admission which has temporarily normalize her blood pressure to 108/73 but her current BP again 91/57.  Patient has history of adrenal insufficiency and is on Florinef as well as low dose prednisone, which will continue. - Presentation with fever and generalized rash is worrisome for acute infectious process, - Seen by infectious disease who recommended vancomycin, discontinued on 9/14.  - Follow-up blood culture results: No growth to date, repeat blood cultures done 9/14: NGTD - Urine culture: enterococcus positive sensitive to vancomycin - Hypotension - resolved now. - general surgery for skin biopsy and I&D small abscess done 9/15.  Hopefully skin biopsy results will be available in 1-2 days from pathology.  - will order incentive spirometry and encourage ambulation  - I spoke with ID 9/17: Dr. Drue SecondSnider, will  order CT chest abd pelvis to rule out occult abscess; would consider MRI L spine but pt denies having headache, stiff neck or back pain.  - I ordered lyme testing, CRP, sed rate today - RPR and cryptococcol antigen testing was negative    Wegner's disease (congenital syphilitic osteochondritis) - Continue low-dose prednisone  -  Holding Methotrexate  Small abscess left side  -s/p I&D in OR 9/15    Adrenal insufficiency (HCC) - Continue Florinef  Fever Unknown Cause - Blood cultures repeated 9/14 No growth to date - Tylenol being given around the clock  Enterococcus UTI  Pt was treated with vancomycin  Hypokalemia - replacing orally, recheck in AM  Chronic Pain - Pt has implanted pain pump for intrathecal prialt administration (placed Aug 2017) by Dr. Elberta LeatherwoodQadri ([facility]) - I spoke with Dr Jeanice LimQuadri 9/14.   He is available to be contacted by cell phone [phone]  Right knee Pain - no edema or effusion seen on today's exam, but tender to palpation, check xray of right knee 9/17    OSA (obstructive sleep apnea) - Stable respiratory status     Peripheral neuropathy (HCC) - Continue gabapentin    Essential hypertension - Blood pressure medications on hold for now, BPs have been well controlled and stable.  She was hypotensive on admission but that has resolved.     CKD (chronic kidney disease) stage 3, GFR 30-59 ml/min - Baseline creatinine about 3 years ago is 1.14 - Creatinine on this admission is 1.3.  Holding home lasix for now as this may have contributed to worsening renal function   DVT prophylaxis: lovenox  Code Status:  full code  Family Communication: husband at the bedside   Disposition Plan: medical floor  Consults called: ID, Dr. Cliffton Asters, general surgery called 9/14  Antimicrobials: Anti-infectives    Start     Dose/Rate Route Frequency Ordered Stop   07/05/16 1600  vancomycin (VANCOCIN) 1,250 mg in sodium chloride 0.9 % 250 mL IVPB  Status:   Discontinued     1,250 mg 166.7 mL/hr over 90 Minutes Intravenous Every 24 hours 07/04/16 1627 07/05/16 0828   07/05/16 0900  vancomycin (VANCOCIN) IVPB 750 mg/150 ml premix  Status:  Discontinued     750 mg 150 mL/hr over 60 Minutes Intravenous Every 12 hours 07/05/16 0828 07/06/16 1627   07/04/16 1630  vancomycin (VANCOCIN) 1,500 mg in sodium chloride 0.9 % 500 mL IVPB     1,500 mg 250 mL/hr over 120 Minutes Intravenous  Once 07/04/16 1618 07/04/16 1903      Subjective: Pt says not slept well, fever persisted all night long and into the morning.  Also reports persistent right knee pain.    Objective: Vitals:   07/09/16 0300 07/09/16 0551 07/09/16 0655 07/09/16 0828  BP:  134/62    Pulse:  95    Resp:  20    Temp: (!) 102.7 F (39.3 C) (!) 103.1 F (39.5 C) (!) 103.1 F (39.5 C) (!) 102.2 F (39 C)  TempSrc: Oral Oral Oral Oral  SpO2:  96%    Weight:      Height:        Intake/Output Summary (Last 24 hours) at 07/09/16 0940 Last data filed at 07/09/16 0029  Gross per 24 hour  Intake              773 ml  Output                0 ml  Net              773 ml   Filed Weights   07/04/16 1143 07/04/16 1234 07/04/16 1838  Weight: 103.9 kg (229 lb) 103.9 kg (229 lb) 106.5 kg (234 lb 12.6 oz)   Exam:  Eyes: PERRL, lids and conjunctivae normal ENMT: Mucous membranes are moist. Posterior pharynx clear of any exudate or lesions.Normal dentition.  Neck: normal, supple, no masses, no thyromegaly Respiratory: clear to auscultation bilaterally, no wheezing, no crackles. Normal respiratory effort. No accessory muscle use.  Cardiovascular: Regular rate and rhythm, no murmurs / rubs / gallops. No extremity edema. 2+ pedal pulses. No carotid bruits.  Abdomen: no tenderness, no masses palpated. No hepatosplenomegaly. Bowel sounds positive.  Musculoskeletal: no clubbing / cyanosis. No joint deformity upper and lower extremities. Good ROM, no contractures. Normal muscle tone.  Skin:  sporadic macular rash over trunk, lower extremities. Skin is warm and dry. Small abscess 4 cm diameter fluctuant left side near hip erythematous, central scab noted, tender to palpation Neurologic: CN 2-12 grossly intact. Sensation intact, DTR normal. Strength 5/5 in all 4.  Psychiatric: Normal judgment and insight. Alert and oriented x 3. Normal mood.   Data Reviewed: Basic Metabolic Panel:  Recent Labs Lab 07/04/16 1444 07/05/16 0606 07/07/16 0539 07/08/16 0534  NA 135 140 136 142  K 3.7 4.2 3.3* 3.5  CL 106 113* 109 115*  CO2 21* 21* 21* 21*  GLUCOSE 90 100* 100* 136*  BUN 15 10 6 8   CREATININE 1.39* 1.09* 1.03* 0.81  CALCIUM 8.9 8.3* 7.9* 8.7*   Liver Function Tests:  Recent Labs Lab 07/05/16 0606  07/08/16 0534  AST 41 52*  ALT 30 47  ALKPHOS 104 136*  BILITOT 0.2* 0.4  PROT 6.0* 6.0*  ALBUMIN 3.0* 2.8*   No results for input(s): LIPASE, AMYLASE in the last 168 hours. No results for input(s): AMMONIA in the last 168 hours. CBC:  Recent Labs Lab 07/04/16 1444 07/05/16 0606 07/07/16 1130 07/08/16 0534  WBC 7.7 6.6 9.2 7.6  NEUTROABS 3.6  --   --  4.0  HGB 14.4 12.5 11.2* 11.4*  HCT 44.3 38.8 34.4* 34.1*  MCV 91.7 92.8 89.8 88.6  PLT 303 245 236 244   Cardiac Enzymes: No results for input(s): CKTOTAL, CKMB, CKMBINDEX, TROPONINI in the last 168 hours. CBG (last 3)   Recent Labs  07/08/16 0807  GLUCAP 111*   Recent Results (from the past 240 hour(s))  Urine culture     Status: Abnormal   Collection Time: 07/04/16  2:00 PM  Result Value Ref Range Status   Specimen Description URINE, CLEAN CATCH  Final   Special Requests NONE  Final   Culture 60,000 COLONIES/mL ENTEROCOCCUS FAECALIS (A)  Final   Report Status 07/06/2016 FINAL  Final   Organism ID, Bacteria ENTEROCOCCUS FAECALIS (A)  Final      Susceptibility   Enterococcus faecalis - MIC*    AMPICILLIN <=2 SENSITIVE Sensitive     LEVOFLOXACIN 0.5 SENSITIVE Sensitive     NITROFURANTOIN <=16  SENSITIVE Sensitive     VANCOMYCIN 1 SENSITIVE Sensitive     * 60,000 COLONIES/mL ENTEROCOCCUS FAECALIS  Blood culture (routine x 2)     Status: None (Preliminary result)   Collection Time: 07/04/16  2:39 PM  Result Value Ref Range Status   Specimen Description BLOOD LEFT HAND  Final   Special Requests BOTTLES DRAWN AEROBIC ONLY 5 CC  Final   Culture   Final    NO GROWTH 4 DAYS Performed at [facility]    Report Status PENDING  Incomplete  Blood culture (routine x 2)     Status: None (Preliminary result)   Collection Time: 07/04/16  2:54 PM  Result Value Ref Range Status   Specimen Description BLOOD RIGHT ANTECUBITAL  Final   Special Requests BOTTLES DRAWN AEROBIC AND ANAEROBIC 5 CC EA  Final   Culture   Final    NO GROWTH 4 DAYS Performed at [facility]    Report Status PENDING  Incomplete  MRSA PCR Screening     Status: None   Collection Time: 07/06/16 10:07 PM  Result Value Ref Range Status   MRSA by PCR NEGATIVE NEGATIVE Final    Comment:        The GeneXpert MRSA Assay (FDA approved for NASAL specimens only), is one component of a comprehensive MRSA colonization surveillance program. It is not intended to diagnose MRSA infection nor to guide or monitor treatment for MRSA infections.   Culture, blood (routine x 2)     Status: None (Preliminary result)   Collection Time: 07/07/16  7:50 AM  Result Value Ref Range Status   Specimen Description BLOOD RIGHT HAND  Final   Special Requests BOTTLES DRAWN AEROBIC ONLY 5CC  Final   Culture   Final    NO GROWTH 1 DAY Performed at [facility]    Report Status PENDING  Incomplete  Culture, blood (routine x 2)     Status: None (Preliminary result)   Collection Time: 07/07/16  7:50 AM  Result Value Ref Range Status   Specimen Description BLOOD  RIGHT ARM  Final   Special Requests BOTTLES DRAWN AEROBIC ONLY 5CC  Final   Culture   Final    NO GROWTH 1 DAY Performed at [facility]    Report  Status PENDING  Incomplete    Studies: No results found. Scheduled Meds: . B-complex with vitamin C  1 tablet Oral Daily  . DULoxetine  60 mg Oral BID  . enoxaparin (LOVENOX) injection  40 mg Subcutaneous Q24H  . fludrocortisone  0.1 mg Oral Daily  . folic acid  1 mg Oral Daily  . gabapentin  1,200 mg Oral Daily  . gabapentin  1,600 mg Oral QHS  . lacosamide  100 mg Oral BID  . pantoprazole  40 mg Oral Daily  . predniSONE  5 mg Oral Daily  . topiramate  50 mg Oral QHS  . traZODone  50 mg Oral QHS  . vitamin B-12  1,000 mcg Oral Daily  . Vitamin D (Ergocalciferol)  50,000 Units Oral Q7 days   Continuous Infusions: . sodium chloride 10 mL/hr at 07/08/16 1230  . lactated ringers 100 mL/hr at 07/07/16 1710   Principal Problem:   Fever Active Problems:   Wegner's disease (congenital syphilitic osteochondritis)   Adrenal insufficiency (HCC)   OSA (obstructive sleep apnea)   Peripheral neuropathy (HCC)   Hypotension   Rash   CKD (chronic kidney disease) stage 3, GFR 30-59 ml/min   Fever of unknown origin  Time spent:   Standley Dakins, MD, FAAFP [facility] Pager [phone]  If 7PM-7AM, please contact night-coverage [email or web address] Password [email or web address] 07/09/2016, 9:40 AM    LOS: 4 days

## 2016-07-09 NOTE — Progress Notes (Signed)
PT Cancellation Note  Patient Details Name: [name] MRN: [id number] DOB: 20-Nov-1965   Cancelled Treatment:    Reason Eval/Treat Not Completed: Patient at procedure or test/unavailable   Rada HayHill, Karen Elizabeth 07/09/2016, 12:06 PM

## 2016-07-10 DIAGNOSIS — M25561 Pain in right knee: Secondary | ICD-10-CM

## 2016-07-10 DIAGNOSIS — M79641 Pain in right hand: Secondary | ICD-10-CM

## 2016-07-10 DIAGNOSIS — D72829 Elevated white blood cell count, unspecified: Secondary | ICD-10-CM

## 2016-07-10 LAB — COMPREHENSIVE METABOLIC PANEL
ALT: 54 U/L (ref 14–54)
ANION GAP: 6 (ref 5–15)
AST: 70 U/L — ABNORMAL HIGH (ref 15–41)
Albumin: 2.5 g/dL — ABNORMAL LOW (ref 3.5–5.0)
Alkaline Phosphatase: 140 U/L — ABNORMAL HIGH (ref 38–126)
BUN: 9 mg/dL (ref 6–20)
CALCIUM: 8 mg/dL — AB (ref 8.9–10.3)
CHLORIDE: 109 mmol/L (ref 101–111)
CO2: 24 mmol/L (ref 22–32)
Creatinine, Ser: 0.88 mg/dL (ref 0.44–1.00)
Glucose, Bld: 119 mg/dL — ABNORMAL HIGH (ref 65–99)
Potassium: 3 mmol/L — ABNORMAL LOW (ref 3.5–5.1)
SODIUM: 139 mmol/L (ref 135–145)
Total Bilirubin: 0.3 mg/dL (ref 0.3–1.2)
Total Protein: 5.4 g/dL — ABNORMAL LOW (ref 6.5–8.1)

## 2016-07-10 LAB — MAGNESIUM: MAGNESIUM: 1.8 mg/dL (ref 1.7–2.4)

## 2016-07-10 LAB — CBC WITH DIFFERENTIAL/PLATELET
BASOS PCT: 0 %
Basophils Absolute: 0 10*3/uL (ref 0.0–0.1)
EOS ABS: 0.1 10*3/uL (ref 0.0–0.7)
Eosinophils Relative: 1 %
HCT: 31.8 % — ABNORMAL LOW (ref 36.0–46.0)
Hemoglobin: 10.5 g/dL — ABNORMAL LOW (ref 12.0–15.0)
LYMPHS ABS: 5.1 10*3/uL — AB (ref 0.7–4.0)
Lymphocytes Relative: 49 %
MCH: 29.7 pg (ref 26.0–34.0)
MCHC: 33 g/dL (ref 30.0–36.0)
MCV: 90.1 fL (ref 78.0–100.0)
MONO ABS: 0.3 10*3/uL (ref 0.1–1.0)
Monocytes Relative: 3 %
NEUTROS ABS: 4.9 10*3/uL (ref 1.7–7.7)
Neutrophils Relative %: 47 %
PLATELETS: 256 10*3/uL (ref 150–400)
RBC: 3.53 MIL/uL — ABNORMAL LOW (ref 3.87–5.11)
RDW: 16.1 % — ABNORMAL HIGH (ref 11.5–15.5)
WBC: 10.4 10*3/uL (ref 4.0–10.5)

## 2016-07-10 LAB — B. BURGDORFI ANTIBODIES

## 2016-07-10 LAB — C-REACTIVE PROTEIN: CRP: 8.8 mg/dL — ABNORMAL HIGH (ref <1.0)

## 2016-07-10 LAB — GLUCOSE, CAPILLARY: GLUCOSE-CAPILLARY: 87 mg/dL (ref 65–99)

## 2016-07-10 MED ORDER — POTASSIUM CHLORIDE CRYS ER 20 MEQ PO TBCR
30.0000 meq | EXTENDED_RELEASE_TABLET | Freq: Three times a day (TID) | ORAL | Status: AC
  Administered 2016-07-10 – 2016-07-11 (×6): 30 meq via ORAL
  Filled 2016-07-10 (×6): qty 1

## 2016-07-10 NOTE — Progress Notes (Signed)
[facility]    Date of Admission:  07/04/2016             Principal Problem:   Fever Active Problems:   Hypotension   Rash   Wegner's disease (congenital syphilitic osteochondritis)   Adrenal insufficiency (HCC)   OSA (obstructive sleep apnea)   Peripheral neuropathy (HCC)   CKD (chronic kidney disease) stage 3, GFR 30-59 ml/min   Fever of unknown origin   . B-complex with vitamin C  1 tablet Oral Daily  . DULoxetine  60 mg Oral BID  . enoxaparin (LOVENOX) injection  40 mg Subcutaneous Q24H  . fludrocortisone  0.1 mg Oral Daily  . folic acid  1 mg Oral Daily  . gabapentin  1,200 mg Oral Daily  . gabapentin  1,600 mg Oral QHS  . lacosamide  100 mg Oral BID  . lidocaine  1 patch Transdermal Q24H  . pantoprazole  40 mg Oral Daily  . potassium chloride  30 mEq Oral TID  . predniSONE  5 mg Oral Daily  . topiramate  50 mg Oral QHS  . traZODone  50 mg Oral QHS  . vitamin B-12  1,000 mcg Oral Daily  . Vitamin D (Ergocalciferol)  50,000 Units Oral Q7 days    SUBJECTIVE: She feels like her rash may be slightly better over the past few days. She continues to have high fevers. She is still aching all over but now notes more focal pain in her right hand and right knee.  Review of Systems: Review of Systems  Constitutional: Positive for chills, diaphoresis, fever and malaise/fatigue. Negative for weight loss.  HENT: Negative for sore throat.   Eyes: Negative for redness.  Respiratory: Negative for cough, sputum production and shortness of breath.        She now believes that her cough is compatible with her chronic Wegener's associated cough.  Cardiovascular: Negative for chest pain.  Gastrointestinal: Negative for abdominal pain, diarrhea, nausea and vomiting.  Genitourinary: Negative for dysuria, frequency and urgency.  Musculoskeletal: Positive for joint pain and myalgias.  Skin: Positive for rash. Negative for itching.  Neurological:  Positive for headaches.    Past Medical History:  Diagnosis Date  . Chronic cough   . Diabetes mellitus without complication (HCC)   . Hypertension   . OSA (obstructive sleep apnea)   . Peripheral neuropathy (HCC)   . Wegener's granulomatosis (HCC) 2009  . Wegner's disease (congenital syphilitic osteochondritis)     Social History  Substance Use Topics  . Smoking status: Never Smoker  . Smokeless tobacco: Never Used  . Alcohol use Yes     Comment: occasionally    Family History  Problem Relation Age of Onset  . Diabetes Mother   . Heart disease Mother   . Diabetes Father    Allergies  Allergen Reactions  . Motrin [Ibuprofen] Other (See Comments)    Pt is unable to take this due to kidney problems.    . Penicillins Rash and Other (See Comments)    Has patient had a PCN reaction causing immediate rash, facial/tongue/throat swelling, SOB or lightheadedness with hypotension: No Has patient had a PCN reaction causing severe rash involving mucus membranes or skin necrosis: No Has patient had a PCN reaction that required hospitalization No Has patient had a PCN reaction occurring within the last 10 years: No If all of the above answers are "NO", then may proceed with Cephalosporin use.  OBJECTIVE: Vitals:   07/10/16 0325 07/10/16 0536 07/10/16 0932 07/10/16 1125  BP:  102/70    Pulse:  76    Resp:  16    Temp: 99.6 F (37.6 C) 98.7 F (37.1 C) (!) 102.3 F (39.1 C) 100.3 F (37.9 C)  TempSrc: Oral Oral    SpO2:  96%    Weight:      Height:       Body mass index is 37.9 kg/m.  Physical Exam  Constitutional: She is oriented to person, place, and time.  She is covered in blankets.  HENT:  Mouth/Throat: No oropharyngeal exudate.  Eyes: Conjunctivae are normal.  She has some new, slight conjunctival redness on the left.  Neck: Neck supple.  Cardiovascular: Normal rate and regular rhythm.   No murmur heard. Pulmonary/Chest: Effort normal and breath sounds  normal. She has no wheezes. She has no rales.  Abdominal: Soft. There is no tenderness.  Musculoskeletal: Normal range of motion. She exhibits tenderness. She exhibits no edema.  No swelling or redness is noted in her right hand or right knee. She is currently febrile and warm to touch everywhere.  Neurological: She is alert and oriented to person, place, and time.  Skin: Rash noted. There is erythema.  Her diffuse maculopapular rash is a little less erythematous.  Psychiatric: Mood and affect normal.    Lab Results Lab Results  Component Value Date   WBC 10.4 07/10/2016   HGB 10.5 (L) 07/10/2016   HCT 31.8 (L) 07/10/2016   MCV 90.1 07/10/2016   PLT 256 07/10/2016    Lab Results  Component Value Date   CREATININE 0.88 07/10/2016   BUN 9 07/10/2016   NA 139 07/10/2016   K 3.0 (L) 07/10/2016   CL 109 07/10/2016   CO2 24 07/10/2016    Lab Results  Component Value Date   ALT 54 07/10/2016   AST 70 (H) 07/10/2016   ALKPHOS 140 (H) 07/10/2016   BILITOT 0.3 07/10/2016    Serum cryptococcal antigen 07/06/2016: Negative  Microbiology: Recent Results (from the past 240 hour(s))  Urine culture     Status: Abnormal   Collection Time: 07/04/16  2:00 PM  Result Value Ref Range Status   Specimen Description URINE, CLEAN CATCH  Final   Special Requests NONE  Final   Culture 60,000 COLONIES/mL ENTEROCOCCUS FAECALIS (A)  Final   Report Status 07/06/2016 FINAL  Final   Organism ID, Bacteria ENTEROCOCCUS FAECALIS (A)  Final      Susceptibility   Enterococcus faecalis - MIC*    AMPICILLIN <=2 SENSITIVE Sensitive     LEVOFLOXACIN 0.5 SENSITIVE Sensitive     NITROFURANTOIN <=16 SENSITIVE Sensitive     VANCOMYCIN 1 SENSITIVE Sensitive     * 60,000 COLONIES/mL ENTEROCOCCUS FAECALIS  Blood culture (routine x 2)     Status: None   Collection Time: 07/04/16  2:39 PM  Result Value Ref Range Status   Specimen Description BLOOD LEFT HAND  Final   Special Requests BOTTLES DRAWN AEROBIC ONLY  5 CC  Final   Culture   Final    NO GROWTH 5 DAYS Performed at [facility]    Report Status 07/09/2016 FINAL  Final  Blood culture (routine x 2)     Status: None   Collection Time: 07/04/16  2:54 PM  Result Value Ref Range Status   Specimen Description BLOOD RIGHT ANTECUBITAL  Final   Special Requests BOTTLES DRAWN AEROBIC AND ANAEROBIC 5 CC EA  Final   Culture   Final    NO GROWTH 5 DAYS [facility]    Report Status 07/09/2016 FINAL  Final  MRSA PCR Screening     Status: None   Collection Time: 07/06/16 10:07 PM  Result Value Ref Range Status   MRSA by PCR NEGATIVE NEGATIVE Final    Comment:        The GeneXpert MRSA Assay (FDA approved for NASAL specimens only), is one component of a comprehensive MRSA colonization surveillance program. It is not intended to diagnose MRSA infection nor to guide or monitor treatment for MRSA infections.   Culture, blood (routine x 2)     Status: None (Preliminary result)   Collection Time: 07/07/16  7:50 AM  Result Value Ref Range Status   Specimen Description BLOOD RIGHT HAND  Final   Special Requests BOTTLES DRAWN AEROBIC ONLY 5CC  Final   Culture   Final    NO GROWTH 2 DAYS [facility]    Report Status PENDING  Incomplete  Culture, blood (routine x 2)     Status: None (Preliminary result)   Collection Time: 07/07/16  7:50 AM  Result Value Ref Range Status   Specimen Description BLOOD RIGHT ARM  Final   Special Requests BOTTLES DRAWN AEROBIC ONLY 5CC  Final   Culture   Final    NO GROWTH 2 DAYS Performed at [facility]    Report Status PENDING  Incomplete     ASSESSMENT: The cause of her fever, rash and leukocytosis remains unclear. All blood cultures are negative. The results of her skin biopsy are pending.  PLAN: 1. Observe off of antibiotics 2. Await results of skin biopsy   Cliffton AstersJohn Jayshon Dommer, MD [facility] [phone] pager   [phone] cell Patient ID: Stacey CromerLisa [name], female   DOB: Aug 02, 1966, 50 y.o.   MRN: [id number]

## 2016-07-10 NOTE — Progress Notes (Signed)
PROGRESS NOTE   Xin Klawitter  MVH:[id number]  DOB: 27-Jun-1966  DOA: 07/04/2016 PCP: No PCP Per Patient  Hospital course: Malasha Kleppe is a 50 y.o. 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 She has been seen by primary care physician who referred her to emergency room for evaluation. Patient reported that rash started initially on lower extremities and then spread to her trunk. She reports no exposure to ticks. No reports of IV drug abuse. She did not know what the maximum temperature was but her fever has been intermittent and worse over past 24 hours. No reports of chest pain or shortness of breath. No cough.  No abdominal pain, nausea, vomiting or diarrhea.  Assessment & Plan:     Fever / Generalized maculopapular rash / Hypotension - Patient presented with fever, generalized painful rash and hypotension. She she has required IV fluids on the admission which has temporarily normalize her blood pressure to 108/73 but her current BP again 91/57.  Patient has history of adrenal insufficiency and is on Florinef as well as low dose prednisone, which will continue. - Presentation with fever and generalized rash was worrisome for acute infectious process, but no infection has been found to date - Seen by infectious disease received vancomycin x 3 days, discontinued on 9/14.  - Follow-up blood culture results: No growth to date, repeat blood cultures done 9/14: NGTD - Urine culture: enterococcus positive sensitive to vancomycin - Hypotension - resolved now. - general surgery for skin biopsy and I&D small abscess done 9/15.  Hopefully skin biopsy results will be available soon from pathology.  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    Adrenal insufficiency (HCC) - Continue Florinef, prednisone  Fever Unknown Cause - Blood cultures repeated 9/14 No growth to date - Tylenol being given around the clock  Enterococcus UTI  Pt was treated with vancomycin x 3 doses  Hypokalemia - replacing orally, recheck in AM, add magnesium test  Chronic Pain - Pt has implanted pain pump for intrathecal prialt administration (placed Aug 2017) by Dr. Elberta Leatherwood ([facility]) - I spoke with Dr Jeanice Lim 9/14.   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  She was hypotensive on admission but that has resolved.     CKD (chronic kidney disease) stage 3, GFR 30-59 ml/min - Baseline creatinine about 3 years ago is 1.14 - Creatinine on this admission is 1.3.  Holding home lasix for  now as this may have contributed to worsening renal function   DVT prophylaxis: lovenox  Code Status: full code  Family Communication: husband at the bedside   Disposition Plan: medical floor  Consults called: ID, Dr. Cliffton Asters, general surgery called 9/14  Antimicrobials: Anti-infectives    Start     Dose/Rate Route  Frequency Ordered Stop   07/05/16 1600  vancomycin (VANCOCIN) 1,250 mg in sodium chloride 0.9 % 250 mL IVPB  Status:  Discontinued     1,250 mg 166.7 mL/hr over 90 Minutes Intravenous Every 24 hours 07/04/16 1627 07/05/16 0828   07/05/16 0900  vancomycin (VANCOCIN) IVPB 750 mg/150 ml premix  Status:  Discontinued     750 mg 150 mL/hr over 60 Minutes Intravenous Every 12 hours 07/05/16 0828 07/06/16 1627   07/04/16 1630  vancomycin (VANCOCIN) 1,500 mg in sodium chloride 0.9 % 500 mL IVPB     1,500 mg 250 mL/hr over 120 Minutes Intravenous  Once 07/04/16 1618 07/04/16 1903     Subjective: Pt still having fever but less than yesterday  Objective: Vitals:   07/09/16 2102 07/10/16 0325 07/10/16 0536 07/10/16 0932  BP: 107/72  102/70   Pulse: 67  76   Resp: 16  16   Temp: 98.6 F (37 C) 99.6 F (37.6 C) 98.7 F (37.1 C) (!) 102.3 F (39.1 C)  TempSrc: Oral Oral Oral   SpO2: 98%  96%   Weight:      Height:        Intake/Output Summary (Last 24 hours) at 07/10/16 0942 Last data filed at 07/10/16 0100  Gross per 24 hour  Intake              360 ml  Output                0 ml  Net              360 ml   Filed Weights   07/04/16 1143 07/04/16 1234 07/04/16 1838  Weight: 103.9 kg (229 lb) 103.9 kg (229 lb) 106.5 kg (234 lb 12.6 oz)   Exam:  Eyes: PERRL, lids and conjunctivae normal ENMT: Mucous membranes are moist. Posterior pharynx clear of any exudate or lesions.Normal dentition.  Neck: normal, supple, no masses, no thyromegaly Respiratory: clear to auscultation bilaterally, no wheezing, no crackles. Normal respiratory effort. No accessory muscle use.  Cardiovascular: Regular rate and rhythm, no murmurs / rubs / gallops. No extremity edema. 2+ pedal pulses. No carotid bruits.  Abdomen: no tenderness, no masses palpated. No hepatosplenomegaly. Bowel sounds positive.  Musculoskeletal: no clubbing / cyanosis. No joint deformity upper and lower extremities. Good ROM, no  contractures. Normal muscle tone.  Skin: sporadic macular rash over trunk, lower extremities. Skin is warm and dry. Small abscess 4 cm diameter fluctuant left side near hip erythematous, central scab noted, tender to palpation Neurologic: CN 2-12 grossly intact. Sensation intact, DTR normal. Strength 5/5 in all 4.  Psychiatric: Normal judgment and insight. Alert and oriented x 3. Normal mood.   Data Reviewed: Basic Metabolic Panel:  Recent Labs Lab 07/05/16 0606 07/07/16 0539 07/08/16 0534 07/09/16 1049 07/10/16 0506  NA 140 136 142 136 139  K 4.2 3.3* 3.5 3.1* 3.0*  CL 113* 109 115* 106 109  CO2 21* 21* 21* 23 24  GLUCOSE 100* 100* 136* 110* 119*  BUN 10 6 8 8 9   CREATININE 1.09* 1.03* 0.81 1.05* 0.88  CALCIUM 8.3* 7.9* 8.7* 8.1* 8.0*  Liver Function Tests:  Recent Labs Lab 07/05/16 0606 07/08/16 0534 07/10/16 0506  AST 41 52* 70*  ALT 30 47 54  ALKPHOS 104 136* 140*  BILITOT 0.2* 0.4 0.3  PROT 6.0* 6.0* 5.4*  ALBUMIN 3.0* 2.8* 2.5*   No results for input(s): LIPASE, AMYLASE in the last 168 hours. No results for input(s): AMMONIA in the last 168 hours. CBC:  Recent Labs Lab 07/04/16 1444 07/05/16 0606 07/07/16 1130 07/08/16 0534 07/09/16 1514 07/10/16 0506  WBC 7.7 6.6 9.2 7.6 14.1* 10.4  NEUTROABS 3.6  --   --  4.0 5.5 4.9  HGB 14.4 12.5 11.2* 11.4* 11.3* 10.5*  HCT 44.3 38.8 34.4* 34.1* 35.4* 31.8*  MCV 91.7 92.8 89.8 88.6 92.2 90.1  PLT 303 245 236 244 270 256   Cardiac Enzymes: No results for input(s): CKTOTAL, CKMB, CKMBINDEX, TROPONINI in the last 168 hours. CBG (last 3)   Recent Labs  07/08/16 0807 07/10/16 0738  GLUCAP 111* 87   Recent Results (from the past 240 hour(s))  Urine culture     Status: Abnormal   Collection Time: 07/04/16  2:00 PM  Result Value Ref Range Status   Specimen Description URINE, CLEAN CATCH  Final   Special Requests NONE  Final   Culture 60,000 COLONIES/mL ENTEROCOCCUS FAECALIS (A)  Final   Report Status  07/06/2016 FINAL  Final   Organism ID, Bacteria ENTEROCOCCUS FAECALIS (A)  Final      Susceptibility   Enterococcus faecalis - MIC*    AMPICILLIN <=2 SENSITIVE Sensitive     LEVOFLOXACIN 0.5 SENSITIVE Sensitive     NITROFURANTOIN <=16 SENSITIVE Sensitive     VANCOMYCIN 1 SENSITIVE Sensitive     * 60,000 COLONIES/mL ENTEROCOCCUS FAECALIS  Blood culture (routine x 2)     Status: None   Collection Time: 07/04/16  2:39 PM  Result Value Ref Range Status   Specimen Description BLOOD LEFT HAND  Final   Special Requests BOTTLES DRAWN AEROBIC ONLY 5 CC  Final   Culture   Final    NO GROWTH 5 DAYS Performed at [facility]    Report Status 07/09/2016 FINAL  Final  Blood culture (routine x 2)     Status: None   Collection Time: 07/04/16  2:54 PM  Result Value Ref Range Status   Specimen Description BLOOD RIGHT ANTECUBITAL  Final   Special Requests BOTTLES DRAWN AEROBIC AND ANAEROBIC 5 CC EA  Final   Culture   Final    NO GROWTH 5 DAYS Performed at [facility]    Report Status 07/09/2016 FINAL  Final  MRSA PCR Screening     Status: None   Collection Time: 07/06/16 10:07 PM  Result Value Ref Range Status   MRSA by PCR NEGATIVE NEGATIVE Final    Comment:        The GeneXpert MRSA Assay (FDA approved for NASAL specimens only), is one component of a comprehensive MRSA colonization surveillance program. It is not intended to diagnose MRSA infection nor to guide or monitor treatment for MRSA infections.   Culture, blood (routine x 2)     Status: None (Preliminary result)   Collection Time: 07/07/16  7:50 AM  Result Value Ref Range Status   Specimen Description BLOOD RIGHT HAND  Final   Special Requests BOTTLES DRAWN AEROBIC ONLY 5CC  Final   Culture   Final    NO GROWTH 2 DAYS Performed at [facility]    Report Status PENDING  Incomplete  Culture, blood (routine x 2)     Status: None (Preliminary result)   Collection Time: 07/07/16  7:50 AM  Result Value  Ref Range Status   Specimen Description BLOOD RIGHT ARM  Final   Special Requests BOTTLES DRAWN AEROBIC ONLY 5CC  Final   Culture   Final    NO GROWTH 2 DAYS [facility]    Report Status PENDING  Incomplete    Studies: Ct Chest W Contrast  Result Date: 07/09/2016 CLINICAL DATA:  Fever of unknown etiology. Evaluate for source of infection EXAM: CT CHEST, ABDOMEN, AND PELVIS WITH CONTRAST TECHNIQUE: Multidetector CT imaging of the chest, abdomen and pelvis was performed following the standard protocol during bolus administration of intravenous contrast. CONTRAST:  30mL ISOVUE-300 IOPAMIDOL (ISOVUE-300) INJECTION 61%, 100mL ISOVUE-300 IOPAMIDOL (ISOVUE-300) INJECTION 61% COMPARISON:  CT chest 05/30/2013 FINDINGS: CT CHEST FINDINGS Cardiovascular: No pericardial fluid. No acute findings aorta great vessels. Pulmonary arteries are grossly normal. Mediastinum/Nodes: No mediastinal hilar adenopathy. No axillary adenopathy. Esophagus normal. Lungs/Pleura: No pulmonary infiltrate or infarction. No pleural fluid. Airways are normal. Musculoskeletal: No aggressive osseous lesion. CT ABDOMEN AND PELVIS FINDINGS Hepatobiliary: No focal hepatic lesion. Postcholecystectomy. No biliary dilatation. Mild hepatic steatosis noted. Pancreas: Pancreas is normal. No ductal dilatation. No pancreatic inflammation. Spleen: Several large splenules.  Vascular clips the splenic hilum. Adrenals/urinary tract: Adrenal glands are normal. Kidneys enhance uniformly. No obstructive uropathy. Ureters bladder normal. Adrenals normal. Stomach/Bowel: Stomach, duodenum, small bowel cecum normal. Appendix not identified. Colon And rectosigmoid colon normal Vascular/Lymphatic: Abdominal aorta is normal caliber. There is no retroperitoneal or periportal lymphadenopathy. No pelvic lymphadenopathy. Reproductive: Post hysterectomy. Other: Anterior LEFT abdominal wall. Electrode leads extend to the paraspinal L3 vertebral body  level and enter the central canal coursing cephalad. No evidence of abscess or inflammation associated with generator pack on lead. Musculoskeletal: No aggressive osseous lesion. IMPRESSION: Chest Impression: 1. No evidence of pulmonary infection. Abdomen / Pelvis Impression: 1. No evidence abscess or infection in abdomen or pelvis. 2. No pyelonephritis. 3. No evidence of infection associated with generator pack or leads. Electronically Signed   By: Genevive BiStewart  Edmunds M.D.   On: 07/09/2016 14:23   Ct Abdomen Pelvis W Contrast  Result Date: 07/09/2016 CLINICAL DATA:  Fever of unknown etiology. Evaluate for source of infection EXAM: CT CHEST, ABDOMEN, AND PELVIS WITH CONTRAST TECHNIQUE: Multidetector CT imaging of the chest, abdomen and pelvis was performed following the standard protocol during bolus administration of intravenous contrast. CONTRAST:  30mL ISOVUE-300 IOPAMIDOL (ISOVUE-300) INJECTION 61%, 100mL ISOVUE-300 IOPAMIDOL (ISOVUE-300) INJECTION 61% COMPARISON:  CT chest 05/30/2013 FINDINGS: CT CHEST FINDINGS Cardiovascular: No pericardial fluid. No acute findings aorta great vessels. Pulmonary arteries are grossly normal. Mediastinum/Nodes: No mediastinal hilar adenopathy. No axillary adenopathy. Esophagus normal. Lungs/Pleura: No pulmonary infiltrate or infarction. No pleural fluid. Airways are normal. Musculoskeletal: No aggressive osseous lesion. CT ABDOMEN AND PELVIS FINDINGS Hepatobiliary: No focal hepatic lesion. Postcholecystectomy. No biliary dilatation. Mild hepatic steatosis noted. Pancreas: Pancreas is normal. No ductal dilatation. No pancreatic inflammation. Spleen: Several large splenules.  Vascular clips the splenic hilum. Adrenals/urinary tract: Adrenal glands are normal. Kidneys enhance uniformly. No obstructive uropathy. Ureters bladder normal. Adrenals normal. Stomach/Bowel: Stomach, duodenum, small bowel cecum normal. Appendix not identified. Colon And rectosigmoid colon normal  Vascular/Lymphatic: Abdominal aorta is normal caliber. There is no retroperitoneal or periportal lymphadenopathy. No pelvic lymphadenopathy. Reproductive: Post hysterectomy. Other: Anterior LEFT abdominal wall. Electrode leads extend to the paraspinal L3 vertebral body level and enter  the central canal coursing cephalad. No evidence of abscess or inflammation associated with generator pack on lead. Musculoskeletal: No aggressive osseous lesion. IMPRESSION: Chest Impression: 1. No evidence of pulmonary infection. Abdomen / Pelvis Impression: 1. No evidence abscess or infection in abdomen or pelvis. 2. No pyelonephritis. 3. No evidence of infection associated with generator pack or leads. Electronically Signed   By: Genevive Bi M.D.   On: 07/09/2016 14:23   Dg Knee Complete 4 Views Right  Result Date: 07/09/2016 CLINICAL DATA:  Pain in the right knee x 3 days. No known injury. EXAM: RIGHT KNEE - COMPLETE 4+ VIEW COMPARISON:  None. FINDINGS: No fracture of the proximal tibia or distal femur. Patella is normal. There is spurring of the superior and inferior aspect of the patella. No joint effusion. IMPRESSION: No fracture or dislocation.  Mild osteoarthritis of the patella. Electronically Signed   By: Genevive Bi M.D.   On: 07/09/2016 11:01   Scheduled Meds: . B-complex with vitamin C  1 tablet Oral Daily  . DULoxetine  60 mg Oral BID  . enoxaparin (LOVENOX) injection  40 mg Subcutaneous Q24H  . fludrocortisone  0.1 mg Oral Daily  . folic acid  1 mg Oral Daily  . gabapentin  1,200 mg Oral Daily  . gabapentin  1,600 mg Oral QHS  . lacosamide  100 mg Oral BID  . lidocaine  1 patch Transdermal Q24H  . pantoprazole  40 mg Oral Daily  . potassium chloride  30 mEq Oral TID  . predniSONE  5 mg Oral Daily  . topiramate  50 mg Oral QHS  . traZODone  50 mg Oral QHS  . vitamin B-12  1,000 mcg Oral Daily  . Vitamin D (Ergocalciferol)  50,000 Units Oral Q7 days   Continuous Infusions: . sodium  chloride 10 mL/hr at 07/08/16 1230  . lactated ringers 100 mL/hr at 07/07/16 1710   Principal Problem:   Fever Active Problems:   Wegner's disease (congenital syphilitic osteochondritis)   Adrenal insufficiency (HCC)   OSA (obstructive sleep apnea)   Peripheral neuropathy (HCC)   Hypotension   Rash   CKD (chronic kidney disease) stage 3, GFR 30-59 ml/min   Fever of unknown origin  Time spent:   Standley Dakins, MD, FAAFP [facility] Pager [phone]  If 7PM-7AM, please contact night-coverage [email or web address] Password [email or web address] 07/10/2016, 9:42 AM    LOS: 5 days

## 2016-07-10 NOTE — Progress Notes (Signed)
Patient ID: Stacey CromerLisa [name], female   DOB: 08-05-1966, 50 y.o.   MRN: [id number] Both rle wounds healing fine, the superior one by secondary intention, the inferior has sutures. There is no infection. Path pending She can shower, sutures can come out next Monday, other wound can just be covered. Will check later this week if she is still here.

## 2016-07-10 NOTE — Progress Notes (Signed)
Date:  July 10, 2016 Chart reviewed for concurrent status and case management needs. Will continue to follow the patient for status change: urosepsis continues to run fevers with hypotension Discharge Planning: following for needs Expected discharge date: [id number] Marcelle SmilingRhonda Vale Mousseau, BSN, [place], [place]   [phone]

## 2016-07-10 NOTE — Progress Notes (Signed)
PT Cancellation Note  Patient Details Name: Stacey CromerLisa [name] MRN: [id number] DOB: 1966/07/01   Cancelled Treatment:     Pt declined to participate at this time. Will attempt to check back as schedule and pt allows. Thanks.    Rebeca AlertJannie Lucy Woolever, MPT Pager: [phone]

## 2016-07-11 LAB — CBC WITH DIFFERENTIAL/PLATELET
BASOS PCT: 0 %
Basophils Absolute: 0 10*3/uL (ref 0.0–0.1)
EOS ABS: 0 10*3/uL (ref 0.0–0.7)
Eosinophils Relative: 0 %
HCT: 33.1 % — ABNORMAL LOW (ref 36.0–46.0)
Hemoglobin: 10.8 g/dL — ABNORMAL LOW (ref 12.0–15.0)
Lymphocytes Relative: 53 %
Lymphs Abs: 5.9 10*3/uL — ABNORMAL HIGH (ref 0.7–4.0)
MCH: 29.5 pg (ref 26.0–34.0)
MCHC: 32.6 g/dL (ref 30.0–36.0)
MCV: 90.4 fL (ref 78.0–100.0)
MONO ABS: 0.6 10*3/uL (ref 0.1–1.0)
Monocytes Relative: 5 %
NEUTROS ABS: 4.7 10*3/uL (ref 1.7–7.7)
Neutrophils Relative %: 42 %
PLATELETS: 295 10*3/uL (ref 150–400)
RBC: 3.66 MIL/uL — ABNORMAL LOW (ref 3.87–5.11)
RDW: 15.9 % — ABNORMAL HIGH (ref 11.5–15.5)
WBC: 11.2 10*3/uL — ABNORMAL HIGH (ref 4.0–10.5)

## 2016-07-11 LAB — BASIC METABOLIC PANEL
Anion gap: 6 (ref 5–15)
BUN: 7 mg/dL (ref 6–20)
CALCIUM: 8.2 mg/dL — AB (ref 8.9–10.3)
CO2: 24 mmol/L (ref 22–32)
Chloride: 110 mmol/L (ref 101–111)
Creatinine, Ser: 0.86 mg/dL (ref 0.44–1.00)
GFR calc Af Amer: >60 mL/min (ref >60)
GLUCOSE: 101 mg/dL — AB (ref 65–99)
POTASSIUM: 3.5 mmol/L (ref 3.5–5.1)
Sodium: 140 mmol/L (ref 135–145)

## 2016-07-11 LAB — GLUCOSE, CAPILLARY: Glucose-Capillary: 94 mg/dL (ref 65–99)

## 2016-07-11 LAB — MPO/PR-3 (ANCA) ANTIBODIES: Myeloperoxidase Abs: <9 U/mL (ref 0.0–9.0)

## 2016-07-11 LAB — HIV ANTIBODY (ROUTINE TESTING W REFLEX): HIV SCREEN 4TH GENERATION: NONREACTIVE

## 2016-07-11 LAB — LYME DISEASE DNA BY PCR(BORRELIA BURG): Lyme Disease(B.burgdorferi)PCR: NEGATIVE

## 2016-07-11 NOTE — Progress Notes (Signed)
07/11/2016 11:10 AM  PROGRESS NOTE   [name]  ZOX:[id number]  DOB: 1966-09-14  DOA: 07/04/2016 PCP: No PCP Per Patient  Hospital course: [name] is a 50 y.o. 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 She has been seen by primary care physician who referred her to emergency room for evaluation. Patient reported that rash started initially on lower extremities and then spread to her trunk. She reports no exposure to ticks. No reports of IV drug abuse. She did not know what the maximum temperature was but her fever has been intermittent and worse over past 24 hours. No reports of chest pain or shortness of breath. No cough.  No abdominal pain, nausea, vomiting or diarrhea.  9/19: Patient and husband want to be transferred to [facility].    Assessment & Plan:     Fever / Generalized maculopapular rash / Hypotension - Patient presented with fever, generalized painful rash and hypotension. She she has required IV fluids on the admission which has temporarily normalize her blood pressure to 108/73 but her current BP again 91/57.  Patient has history of adrenal insufficiency and is on Florinef as well as low dose prednisone, which will continue. - Presentation with fever and generalized rash was worrisome for acute infectious process, but no infection has been found to date - Seen by infectious disease received vancomycin x 3 days, discontinued on 9/14.  - Follow-up blood culture results: No growth to date, repeat blood cultures done 9/14: NGTD - Hypotension - resolved now. - general surgery for skin biopsy and I&D small abscess done 9/15.   Skin Biopsy Results: Diagnosis 1. Skin , left distal thigh: BENIGN SQUAMOUS EPITHELIUM AND SUBMUCOSA WITH SCAR 2. 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    Adrenal insufficiency (HCC) - Continue Florinef, prednisone  Fever Unknown Cause - Blood cultures repeated 9/14 No growth to date - Tylenol being given around the clock  Enterococcus UTI  Pt was treated with vancomycin x 3 doses  Hypokalemia - replacing orally, recheck in AM, add magnesium test  Chronic Pain - Pt has implanted pain pump for intrathecal prialt administration (placed Aug 2017) by Dr. Elberta Leatherwood ([facility]) - I spoke with Dr Jeanice Lim 9/14.   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  She was hypotensive on admission but that has resolved.   CKD (chronic kidney disease) stage 3,  GFR 30-59 ml/min - Baseline creatinine about 3 years ago is 1.14 - Creatinine on this admission is 1.3.  Holding home lasix for now as this may have contributed to worsening renal function   DVT prophylaxis: lovenox  Code Status: full code  Family Communication: husband at  the bedside   Disposition Plan: I placed a call to [facility] to her rheumatologist Dr. Willette PaNancy Allen and to the transfer center, waiting to receive a return call Consults called: ID, Dr. Cliffton Astersampbell John, general surgery called 9/14  Antimicrobials: Anti-infectives    Start     Dose/Rate Route Frequency Ordered Stop   07/05/16 1600  vancomycin (VANCOCIN) 1,250 mg in sodium chloride 0.9 % 250 mL IVPB  Status:  Discontinued     1,250 mg 166.7 mL/hr over 90 Minutes Intravenous Every 24 hours 07/04/16 1627 07/05/16 0828   07/05/16 0900  vancomycin (VANCOCIN) IVPB 750 mg/150 ml premix  Status:  Discontinued     750 mg 150 mL/hr over 60 Minutes Intravenous Every 12 hours 07/05/16 0828 07/06/16 1627   07/04/16 1630  vancomycin (VANCOCIN) 1,500 mg in sodium chloride 0.9 % 500 mL IVPB     1,500 mg 250 mL/hr over 120 Minutes Intravenous  Once 07/04/16 1618 07/04/16 1903     Subjective: Pt and husband [facility] where she can be seen by her rheumatologist  Objective: Vitals:   07/10/16 1946 07/10/16 2118 07/11/16 0549 07/11/16 1000  BP:  110/67 109/70 113/70  Pulse:   79 78  Resp:  18 18 17   Temp: (!) 102.1 F (38.9 C) (!) 100.9 F (38.3 C) 99.9 F (37.7 C) 99.8 F (37.7 C)  TempSrc: Oral Oral Oral Oral  SpO2:  97% 97% 95%  Weight:   110.9 kg (244 lb 7.8 oz)   Height:        Intake/Output Summary (Last 24 hours) at 07/11/16 1110 Last data filed at 07/10/16 1850  Gross per 24 hour  Intake              240 ml  Output                0 ml  Net              240 ml   Filed Weights   07/04/16 1234 07/04/16 1838 07/11/16 0549  Weight: 103.9 kg (229 lb) 106.5 kg (234 lb 12.6 oz) 110.9 kg (244 lb 7.8 oz)   Exam:  Eyes: PERRL, lids and conjunctivae normal ENMT: Mucous membranes are moist. Posterior pharynx clear of any exudate or lesions.Normal dentition.  Neck: normal, supple, no masses, no thyromegaly Respiratory: clear to auscultation bilaterally, no wheezing, no  crackles. Normal respiratory effort. No accessory muscle use.  Cardiovascular: Regular rate and rhythm, no murmurs / rubs / gallops. No extremity edema. 2+ pedal pulses. No carotid bruits.  Abdomen: no tenderness, no masses palpated. No hepatosplenomegaly. Bowel sounds positive.  Musculoskeletal: no clubbing / cyanosis. No joint deformity upper and lower extremities. Good ROM, no contractures. Normal muscle tone.  Skin: sporadic macular rash over trunk, lower extremities. Skin is warm and dry. Small abscess 4 cm diameter fluctuant left side near hip erythematous, central scab noted, tender to palpation Neurologic: CN 2-12 grossly intact. Sensation intact, DTR normal. Strength 5/5 in all 4.  Psychiatric: Normal judgment and insight. Alert and oriented x 3. Normal mood.   Data Reviewed: Basic Metabolic Panel:  Recent Labs Lab 07/07/16 0539 07/08/16 0534 07/09/16 1049 07/10/16  0506 07/11/16 0537  NA 136 142 136 139 140  K 3.3* 3.5 3.1* 3.0* 3.5  CL 109 115* 106 109 110  CO2 21* 21* 23 24 24   GLUCOSE 100* 136* 110* 119* 101*  BUN 6 8 8 9 7   CREATININE 1.03* 0.81 1.05* 0.88 0.86  CALCIUM 7.9* 8.7* 8.1* 8.0* 8.2*  MG  --   --   --  1.8  --    Liver Function Tests:  Recent Labs Lab 07/05/16 0606 07/08/16 0534 07/10/16 0506  AST 41 52* 70*  ALT 30 47 54  ALKPHOS 104 136* 140*  BILITOT 0.2* 0.4 0.3  PROT 6.0* 6.0* 5.4*  ALBUMIN 3.0* 2.8* 2.5*   No results for input(s): LIPASE, AMYLASE in the last 168 hours. No results for input(s): AMMONIA in the last 168 hours. CBC:  Recent Labs Lab 07/04/16 1444  07/07/16 1130 07/08/16 0534 07/09/16 1514 07/10/16 0506 07/11/16 0537  WBC 7.7  < > 9.2 7.6 14.1* 10.4 11.2*  NEUTROABS 3.6  --   --  4.0 5.5 4.9 4.7  HGB 14.4  < > 11.2* 11.4* 11.3* 10.5* 10.8*  HCT 44.3  < > 34.4* 34.1* 35.4* 31.8* 33.1*  MCV 91.7  < > 89.8 88.6 92.2 90.1 90.4  PLT 303  < > 236 244 270 256 295  < > = values in this interval not displayed. Cardiac  Enzymes: No results for input(s): CKTOTAL, CKMB, CKMBINDEX, TROPONINI in the last 168 hours. CBG (last 3)   Recent Labs  07/10/16 0738 07/11/16 0746  GLUCAP 87 94   Recent Results (from the past 240 hour(s))  Urine culture     Status: Abnormal   Collection Time: 07/04/16  2:00 PM  Result Value Ref Range Status   Specimen Description URINE, CLEAN CATCH  Final   Special Requests NONE  Final   Culture 60,000 COLONIES/mL ENTEROCOCCUS FAECALIS (A)  Final   Report Status 07/06/2016 FINAL  Final   Organism ID, Bacteria ENTEROCOCCUS FAECALIS (A)  Final      Susceptibility   Enterococcus faecalis - MIC*    AMPICILLIN <=2 SENSITIVE Sensitive     LEVOFLOXACIN 0.5 SENSITIVE Sensitive     NITROFURANTOIN <=16 SENSITIVE Sensitive     VANCOMYCIN 1 SENSITIVE Sensitive     * 60,000 COLONIES/mL ENTEROCOCCUS FAECALIS  Blood culture (routine x 2)     Status: None   Collection Time: 07/04/16  2:39 PM  Result Value Ref Range Status   Specimen Description BLOOD LEFT HAND  Final   Special Requests BOTTLES DRAWN AEROBIC ONLY 5 CC  Final   Culture   Final    NO GROWTH 5 DAYS Performed at [facility]    Report Status 07/09/2016 FINAL  Final  Blood culture (routine x 2)     Status: None   Collection Time: 07/04/16  2:54 PM  Result Value Ref Range Status   Specimen Description BLOOD RIGHT ANTECUBITAL  Final   Special Requests BOTTLES DRAWN AEROBIC AND ANAEROBIC 5 CC EA  Final   Culture   Final    NO GROWTH 5 DAYS Performed at [facility]    Report Status 07/09/2016 FINAL  Final  MRSA PCR Screening     Status: None   Collection Time: 07/06/16 10:07 PM  Result Value Ref Range Status   MRSA by PCR NEGATIVE NEGATIVE Final    Comment:        The GeneXpert MRSA Assay (FDA approved for NASAL specimens only),  is one component of a comprehensive MRSA colonization surveillance program. It is not intended to diagnose MRSA infection nor to guide or monitor treatment for MRSA  infections.   Culture, blood (routine x 2)     Status: None (Preliminary result)   Collection Time: 07/07/16  7:50 AM  Result Value Ref Range Status   Specimen Description BLOOD RIGHT HAND  Final   Special Requests BOTTLES DRAWN AEROBIC ONLY 5CC  Final   Culture   Final    NO GROWTH 4 DAYS Performed at [facility]    Report Status PENDING  Incomplete  Culture, blood (routine x 2)     Status: None (Preliminary result)   Collection Time: 07/07/16  7:50 AM  Result Value Ref Range Status   Specimen Description BLOOD RIGHT ARM  Final   Special Requests BOTTLES DRAWN AEROBIC ONLY 5CC  Final   Culture   Final    NO GROWTH 4 DAYS Performed at [facility]    Report Status PENDING  Incomplete  Culture, blood (routine x 2)     Status: None (Preliminary result)   Collection Time: 07/09/16  2:18 AM  Result Value Ref Range Status   Specimen Description BLOOD LEFT ARM  Final   Special Requests IN PEDIATRIC BOTTLE 5CC  Final   Culture   Final    NO GROWTH 2 DAYS Performed at [facility]    Report Status PENDING  Incomplete  Culture, blood (routine x 2)     Status: None (Preliminary result)   Collection Time: 07/09/16  2:36 AM  Result Value Ref Range Status   Specimen Description BLOOD RIGHT ANTECUBITAL  Final   Special Requests BOTTLES DRAWN AEROBIC ONLY  Final   Culture   Final    NO GROWTH 2 DAYS Performed at [facility]    Report Status PENDING  Incomplete    Studies: Ct Chest W Contrast  Result Date: 07/09/2016 CLINICAL DATA:  Fever of unknown etiology. Evaluate for source of infection EXAM: CT CHEST, ABDOMEN, AND PELVIS WITH CONTRAST TECHNIQUE: Multidetector CT imaging of the chest, abdomen and pelvis was performed following the standard protocol during bolus administration of intravenous contrast. CONTRAST:  30mL ISOVUE-300 IOPAMIDOL (ISOVUE-300) INJECTION 61%, ISOVUE-300 IOPAMIDOL (ISOVUE-300) INJECTION 61% COMPARISON:  CT chest  05/30/2013 FINDINGS: CT CHEST FINDINGS Cardiovascular: No pericardial fluid. No acute findings aorta great vessels. Pulmonary arteries are grossly normal. Mediastinum/Nodes: No mediastinal hilar adenopathy. No axillary adenopathy. Esophagus normal. Lungs/Pleura: No pulmonary infiltrate or infarction. No pleural fluid. Airways are normal. Musculoskeletal: No aggressive osseous lesion. CT ABDOMEN AND PELVIS FINDINGS Hepatobiliary: No focal hepatic lesion. Postcholecystectomy. No biliary dilatation. Mild hepatic steatosis noted. Pancreas: Pancreas is normal. No ductal dilatation. No pancreatic inflammation. Spleen: Several large splenules.  Vascular clips the splenic hilum. Adrenals/urinary tract: Adrenal glands are normal. Kidneys enhance uniformly. No obstructive uropathy. Ureters bladder normal. Adrenals normal. Stomach/Bowel: Stomach, duodenum, small bowel cecum normal. Appendix not identified. Colon And rectosigmoid colon normal Vascular/Lymphatic: Abdominal aorta is normal caliber. There is no retroperitoneal or periportal lymphadenopathy. No pelvic lymphadenopathy. Reproductive: Post hysterectomy. Other: Anterior LEFT abdominal wall. Electrode leads extend to the paraspinal L3 vertebral body level and enter the central canal coursing cephalad. No evidence of abscess or inflammation associated with generator pack on lead. Musculoskeletal: No aggressive osseous lesion. IMPRESSION: Chest Impression: 1. No evidence of pulmonary infection. Abdomen / Pelvis Impression: 1. No evidence abscess or infection in abdomen or pelvis. 2. No pyelonephritis. 3. No  evidence of infection associated with generator pack or leads. Electronically Signed   By: Genevive Bi M.D.   On: 07/09/2016 14:23   Ct Abdomen Pelvis W Contrast  Result Date: 07/09/2016 CLINICAL DATA:  Fever of unknown etiology. Evaluate for source of infection EXAM: CT CHEST, ABDOMEN, AND PELVIS WITH CONTRAST TECHNIQUE: Multidetector CT imaging of the chest,  abdomen and pelvis was performed following the standard protocol during bolus administration of intravenous contrast. CONTRAST:  30mL ISOVUE-300 IOPAMIDOL (ISOVUE-300) INJECTION 61%, ISOVUE-300 IOPAMIDOL (ISOVUE-300) INJECTION 61% COMPARISON:  CT chest 05/30/2013 FINDINGS: CT CHEST FINDINGS Cardiovascular: No pericardial fluid. No acute findings aorta great vessels. Pulmonary arteries are grossly normal. Mediastinum/Nodes: No mediastinal hilar adenopathy. No axillary adenopathy. Esophagus normal. Lungs/Pleura: No pulmonary infiltrate or infarction. No pleural fluid. Airways are normal. Musculoskeletal: No aggressive osseous lesion. CT ABDOMEN AND PELVIS FINDINGS Hepatobiliary: No focal hepatic lesion. Postcholecystectomy. No biliary dilatation. Mild hepatic steatosis noted. Pancreas: Pancreas is normal. No ductal dilatation. No pancreatic inflammation. Spleen: Several large splenules.  Vascular clips the splenic hilum. Adrenals/urinary tract: Adrenal glands are normal. Kidneys enhance uniformly. No obstructive uropathy. Ureters bladder normal. Adrenals normal. Stomach/Bowel: Stomach, duodenum, small bowel cecum normal. Appendix not identified. Colon And rectosigmoid colon normal Vascular/Lymphatic: Abdominal aorta is normal caliber. There is no retroperitoneal or periportal lymphadenopathy. No pelvic lymphadenopathy. Reproductive: Post hysterectomy. Other: Anterior LEFT abdominal wall. Electrode leads extend to the paraspinal L3 vertebral body level and enter the central canal coursing cephalad. No evidence of abscess or inflammation associated with generator pack on lead. Musculoskeletal: No aggressive osseous lesion. IMPRESSION: Chest Impression: 1. No evidence of pulmonary infection. Abdomen / Pelvis Impression: 1. No evidence abscess or infection in abdomen or pelvis. 2. No pyelonephritis. 3. No evidence of infection associated with generator pack or leads. Electronically Signed   By: Genevive Bi M.D.    On: 07/09/2016 14:23   Scheduled Meds: . B-complex with vitamin C  1 tablet Oral Daily  . DULoxetine  60 mg Oral BID  . enoxaparin (LOVENOX) injection  40 mg Subcutaneous Q24H  . fludrocortisone  0.1 mg Oral Daily  . folic acid  1 mg Oral Daily  . gabapentin  1,200 mg Oral Daily  . gabapentin  1,600 mg Oral QHS  . lacosamide  100 mg Oral BID  . lidocaine  1 patch Transdermal Q24H  . pantoprazole  40 mg Oral Daily  . potassium chloride  30 mEq Oral TID  . predniSONE  5 mg Oral Daily  . topiramate  50 mg Oral QHS  . traZODone  50 mg Oral QHS  . vitamin B-12  1,000 mcg Oral Daily  . Vitamin D (Ergocalciferol)  50,000 Units Oral Q7 days   Continuous Infusions: . sodium chloride 10 mL/hr at 07/08/16 1230  . lactated ringers 100 mL/hr at 07/07/16 1710   Principal Problem:   Fever Active Problems:   Wegner's disease (congenital syphilitic osteochondritis)   Adrenal insufficiency (HCC)   OSA (obstructive sleep apnea)   Peripheral neuropathy (HCC)   Hypotension   Rash   CKD (chronic kidney disease) stage 3, GFR 30-59 ml/min   Fever of unknown origin  Time spent:   Standley Dakins, MD, FAAFP [facility] Pager [phone]  If 7PM-7AM, please contact night-coverage [email or web address] Password [email or web address] 07/11/2016, 11:10 AM    LOS: 6 days

## 2016-07-11 NOTE — Progress Notes (Signed)
07/11/2016 12:12 PM  I was able to speak with [facility] and with the hospitalist and they have accepted patient in transfer.  No beds available today but patient has been wait-listed and they will call and update us with bed assignment when available.  They will call the nursing station directly with updates on bed [facility].    Maryln Manuel. Vamsi Apfel, MD

## 2016-07-11 NOTE — Progress Notes (Signed)
PT Cancellation Note  Patient Details Name: Stacey CromerLisa Otani MRN: [id number] DOB: Dec 07, 1965   Cancelled Treatment:    Reason Eval/Treat Not Completed: PT screened, no needs identified, will sign off (pt denies needs; "moving around with my husband")   [facility],Mena Lienau 07/11/2016, 11:33 AM

## 2016-07-11 NOTE — Progress Notes (Signed)
         [facility]    Date of Admission:  07/04/2016     She continues to have unexplained fever and rash. Her skin biopsy showed only benign findings. Since admission she is developed progressive anemia, lymphocytosis and mild liver transaminases elevations. Although the rash would be unusual with a mononucleosis syndrome due to EBV or CMV I will check those serologies. [name] was sleeping and I did not wake her for an exam. I did speak with her husband. Transfer [facility] where her rheumatologist works is being arranged.         Cliffton AstersJohn Demonta Wombles, MD [facility] [phone] pager   [phone]

## 2016-07-12 DIAGNOSIS — R040 Epistaxis: Secondary | ICD-10-CM

## 2016-07-12 DIAGNOSIS — A5002 Early congenital syphilitic osteochondropathy: Secondary | ICD-10-CM

## 2016-07-12 DIAGNOSIS — R7982 Elevated C-reactive protein (CRP): Secondary | ICD-10-CM

## 2016-07-12 DIAGNOSIS — R700 Elevated erythrocyte sedimentation rate: Secondary | ICD-10-CM

## 2016-07-12 LAB — CULTURE, BLOOD (ROUTINE X 2)
CULTURE: NO GROWTH
CULTURE: NO GROWTH

## 2016-07-12 LAB — ANCA TITERS: C-ANCA: <1:20 {titer}

## 2016-07-12 LAB — GLUCOSE, CAPILLARY: Glucose-Capillary: 101 mg/dL — ABNORMAL HIGH (ref 65–99)

## 2016-07-12 MED ORDER — ACETAMINOPHEN 325 MG PO TABS: 650.0000 mg | ORAL_TABLET | Freq: Four times a day (QID) | ORAL | Status: DC | PRN

## 2016-07-12 MED ORDER — OXYCODONE HCL 5 MG PO TABS: 5.0000 mg | ORAL_TABLET | ORAL | 0 refills | Status: DC | PRN

## 2016-07-12 MED ORDER — HYDROCODONE-ACETAMINOPHEN 5-325 MG PO TABS: 1.0000 | ORAL_TABLET | ORAL | 0 refills | Status: DC | PRN

## 2016-07-12 MED ORDER — DM-GUAIFENESIN ER 30-600 MG PO TB12
1.0000 | ORAL_TABLET | Freq: Two times a day (BID) | ORAL | Status: DC | PRN
Start: 2016-07-12 — End: 2020-10-12

## 2016-07-12 NOTE — Progress Notes (Signed)
07/12/2016 12:48 PM  PROGRESS NOTE   Stacey CromerLisa [name]  ZOX:[id number]  DOB: 17-Mar-1966  DOA: 07/04/2016 PCP: No PCP Per Patient  Hospital course: Stacey CromerLisa [name] is a 50 y.o. 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 She has been seen by primary care physician who referred her to emergency room for evaluation. Patient reported that rash started initially on lower extremities and then spread to her trunk. She reports no exposure to ticks. No reports of IV drug abuse. She did not know what the maximum temperature was but her fever has been intermittent and worse over past 24 hours. No reports of chest pain or shortness of breath. No cough.  No abdominal pain, nausea, vomiting or diarrhea.  9/19: Patient and [facility]    Assessment & Plan:     Fever / Generalized maculopapular rash / Hypotension - Patient presented with fever, generalized painful rash and hypotension. She she has required IV fluids on the admission which has temporarily normalize her blood pressure to 108/73 but her current BP again 91/57.  Patient has history of adrenal insufficiency and is on Florinef as well as low dose prednisone, which will continue. - Presentation with fever and generalized rash was worrisome for acute infectious process, but no infection has been found to date - Seen by infectious disease received vancomycin x 3 days, discontinued on 9/14.  - Follow-up blood culture results: No growth to date, repeat blood cultures done 9/14: NGTD - Hypotension - resolved now. - general surgery for skin biopsy and I&D small abscess done 9/15.   Skin Biopsy Results: Diagnosis 1. Skin , left distal thigh: BENIGN SQUAMOUS EPITHELIUM AND SUBMUCOSA WITH SCAR 2. 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    Adrenal insufficiency (HCC) - Continue Florinef, prednisone  Fever Unknown Cause - Blood cultures repeated 9/14 No growth to date - Tylenol being given around the clock  Enterococcus UTI  Pt was treated with vancomycin x 3 doses  Hypokalemia - replacing orally, recheck in AM, add magnesium test  Chronic Pain - Pt has implanted pain pump for intrathecal prialt administration (placed Aug 2017) by Dr. Elberta LeatherwoodQadri ([facility]) -attending spoke with Dr Jeanice LimQuadri 9/14.   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  She was hypotensive on admission but that  has resolved.   CKD (chronic kidney disease) stage 3, GFR 30-59 ml/min - Baseline creatinine about 3 years ago is 1.14 - Creatinine on this admission is 1.3.  Holding home lasix for now as this may have contributed to worsening renal function     DVT prophylaxis: lovenox  Code Status: full code  Family Communication: husband at the bedside   Disposition Plan: await bed availability at [facility]  Antimicrobials: Anti-infectives    Start     Dose/Rate Route Frequency Ordered Stop   07/05/16 1600  vancomycin (VANCOCIN) 1,250 mg in sodium chloride 0.9 % 250 mL IVPB  Status:  Discontinued     1,250 mg 166.7 mL/hr over 90 Minutes Intravenous Every 24 hours 07/04/16 1627 07/05/16 0828   07/05/16 0900  vancomycin (VANCOCIN) IVPB 750 mg/150 ml premix  Status:  Discontinued     750 mg 150 mL/hr over 60 Minutes Intravenous Every 12 hours 07/05/16 0828 07/06/16 1627   07/04/16 1630  vancomycin (VANCOCIN) 1,500 mg in sodium chloride 0.9 % 500 mL IVPB     1,500 mg 250 mL/hr over 120 Minutes Intravenous  Once 07/04/16 1618 07/04/16 1903     Subjective: C/o nose bleed and hemptysis  Objective: Vitals:   07/11/16 2214 07/12/16 0248 07/12/16 0542 07/12/16 1056  BP: 116/62 (!) 101/59 105/60 118/77  Pulse: 80 75 80 78  Resp: 18 20 18 18   Temp: 100.2 F (37.9 C) 100 F (37.8 C) 99.2 F (37.3 C) 99.2 F (37.3 C)  TempSrc: Oral Oral Oral Oral  SpO2: 100% 100% 94% 96%  Weight:      Height:        Intake/Output Summary (Last 24 hours) at 07/12/16 1248 Last data filed at 07/12/16 1056  Gross per 24 hour  Intake              682 ml  Output                0 ml  Net              682 ml   Filed Weights   07/04/16 1234 07/04/16 1838 07/11/16 0549  Weight: 103.9 kg (229 lb) 106.5 kg (234 lb 12.6 oz) 110.9 kg (244 lb 7.8 oz)   Exam:  Respiratory: clear to auscultation bilaterally, no wheezing, no crackles. Normal respiratory effort. No accessory muscle use.  Cardiovascular: Regular rate and rhythm, no murmurs / rubs / gallops. No extremity edema. 2+ pedal pulses. No carotid bruits.  Abdomen: no tenderness, no masses palpated. No hepatosplenomegaly. Bowel sounds positive.  Musculoskeletal: no clubbing / cyanosis. No joint deformity upper  and lower extremities. Good ROM, no contractures. Normal muscle tone.  Skin: sporadic macular rash over trunk, lower extremities. Skin is warm and dry. Data Reviewed: Basic Metabolic Panel:  Recent Labs Lab 07/07/16 0539 07/08/16 0534 07/09/16 1049 07/10/16 0506 07/11/16 0537  NA 136 142 136 139 140  K 3.3* 3.5 3.1* 3.0* 3.5  CL 109 115* 106 109 110  CO2 21* 21* 23 24 24   GLUCOSE 100* 136* 110* 119* 101*  BUN 6 8 8 9 7   CREATININE 1.03* 0.81 1.05* 0.88 0.86  CALCIUM 7.9* 8.7* 8.1* 8.0* 8.2*  MG  --   --   --  1.8  --    Liver Function Tests:  Recent Labs Lab 07/08/16 0534 07/10/16 0506  AST 52* 70*  ALT 47 54  ALKPHOS 136* 140*  BILITOT 0.4 0.3  PROT 6.0* 5.4*  ALBUMIN  2.8* 2.5*   No results for input(s): LIPASE, AMYLASE in the last 168 hours. No results for input(s): AMMONIA in the last 168 hours. CBC:  Recent Labs Lab 07/07/16 1130 07/08/16 0534 07/09/16 1514 07/10/16 0506 07/11/16 0537  WBC 9.2 7.6 14.1* 10.4 11.2*  NEUTROABS  --  4.0 5.5 4.9 4.7  HGB 11.2* 11.4* 11.3* 10.5* 10.8*  HCT 34.4* 34.1* 35.4* 31.8* 33.1*  MCV 89.8 88.6 92.2 90.1 90.4  PLT 236 244 270 256 295   Cardiac Enzymes: No results for input(s): CKTOTAL, CKMB, CKMBINDEX, TROPONINI in the last 168 hours. CBG (last 3)   Recent Labs  07/10/16 0738 07/11/16 0746 07/12/16 0722  GLUCAP 87 94 101*   Recent Results (from the past 240 hour(s))  Urine culture     Status: Abnormal   Collection Time: 07/04/16  2:00 PM  Result Value Ref Range Status   Specimen Description URINE, CLEAN CATCH  Final   Special Requests NONE  Final   Culture 60,000 COLONIES/mL ENTEROCOCCUS FAECALIS (A)  Final   Report Status 07/06/2016 FINAL  Final   Organism ID, Bacteria ENTEROCOCCUS FAECALIS (A)  Final      Susceptibility   Enterococcus faecalis - MIC*    AMPICILLIN <=2 SENSITIVE Sensitive     LEVOFLOXACIN 0.5 SENSITIVE Sensitive     NITROFURANTOIN <=16 SENSITIVE Sensitive     VANCOMYCIN 1 SENSITIVE  Sensitive     * 60,000 COLONIES/mL ENTEROCOCCUS FAECALIS  Blood culture (routine x 2)     Status: None   Collection Time: 07/04/16  2:39 PM  Result Value Ref Range Status   Specimen Description BLOOD LEFT HAND  Final   Special Requests BOTTLES DRAWN AEROBIC ONLY 5 CC  Final   Culture   Final    NO GROWTH 5 DAYS Performed at [facility]    Report Status 07/09/2016 FINAL  Final  Blood culture (routine x 2)     Status: None   Collection Time: 07/04/16  2:54 PM  Result Value Ref Range Status   Specimen Description BLOOD RIGHT ANTECUBITAL  Final   Special Requests BOTTLES DRAWN AEROBIC AND ANAEROBIC 5 CC EA  Final   Culture   Final    NO GROWTH 5 DAYS Performed at [facility]    Report Status 07/09/2016 FINAL  Final  MRSA PCR Screening     Status: None   Collection Time: 07/06/16 10:07 PM  Result Value Ref Range Status   MRSA by PCR NEGATIVE NEGATIVE Final    Comment:        The GeneXpert MRSA Assay (FDA approved for NASAL specimens only), is one component of a comprehensive MRSA colonization surveillance program. It is not intended to diagnose MRSA infection nor to guide or monitor treatment for MRSA infections.   Culture, blood (routine x 2)     Status: None   Collection Time: 07/07/16  7:50 AM  Result Value Ref Range Status   Specimen Description BLOOD RIGHT HAND  Final   Special Requests BOTTLES DRAWN AEROBIC ONLY 5CC  Final   Culture   Final    NO GROWTH 5 DAYS Performed at [facility]    Report Status 07/12/2016 FINAL  Final  Culture, blood (routine x 2)     Status: None   Collection Time: 07/07/16  7:50 AM  Result Value Ref Range Status   Specimen Description BLOOD RIGHT ARM  Final   Special Requests BOTTLES DRAWN AEROBIC ONLY 5CC  Final  Culture   Final    NO GROWTH 5 DAYS Performed at [facility]    Report Status 07/12/2016 FINAL  Final  Culture, blood (routine x 2)     Status: None (Preliminary result)   Collection Time:  07/09/16  2:18 AM  Result Value Ref Range Status   Specimen Description BLOOD LEFT ARM  Final   Special Requests IN PEDIATRIC BOTTLE 5CC  Final   Culture   Final    NO GROWTH 3 DAYS Performed at [facility]    Report Status PENDING  Incomplete  Culture, blood (routine x 2)     Status: None (Preliminary result)   Collection Time: 07/09/16  2:36 AM  Result Value Ref Range Status   Specimen Description BLOOD RIGHT ANTECUBITAL  Final   Special Requests BOTTLES DRAWN AEROBIC ONLY  Final   Culture   Final    NO GROWTH 3 DAYS Performed at [facility]    Report Status PENDING  Incomplete    Studies: No results found. Scheduled Meds: . B-complex with vitamin C  1 tablet Oral Daily  . DULoxetine  60 mg Oral BID  . enoxaparin (LOVENOX) injection  40 mg Subcutaneous Q24H  . fludrocortisone  0.1 mg Oral Daily  . folic acid  1 mg Oral Daily  . gabapentin  1,200 mg Oral Daily  . gabapentin  1,600 mg Oral QHS  . lacosamide  100 mg Oral BID  . lidocaine  1 patch Transdermal Q24H  . pantoprazole  40 mg Oral Daily  . predniSONE  5 mg Oral Daily  . topiramate  50 mg Oral QHS  . traZODone  50 mg Oral QHS  . vitamin B-12  1,000 mcg Oral Daily  . Vitamin D (Ergocalciferol)  50,000 Units Oral Q7 days   Continuous Infusions: . sodium chloride 10 mL/hr at 07/08/16 1230  . lactated ringers 100 mL/hr at 07/07/16 1710   Principal Problem:   Fever Active Problems:   Wegner's disease (congenital syphilitic osteochondritis)   Adrenal insufficiency (HCC)   OSA (obstructive sleep apnea)   Peripheral neuropathy (HCC)   Hypotension   Rash   CKD (chronic kidney disease) stage 3, GFR 30-59 ml/min   Fever of unknown origin  Time spent:   Joseph Art, DO [facility] Pager [phone]  If 7PM-7AM, please contact night-coverage [email or web address] Password [email or web address] 07/12/2016, 12:48 PM    LOS: 7 days

## 2016-07-12 NOTE — Progress Notes (Signed)
[facility]    Date of Admission:  07/04/2016             Principal Problem:   Fever Active Problems:   Hypotension   Rash   Wegner's disease (congenital syphilitic osteochondritis)   Adrenal insufficiency (HCC)   OSA (obstructive sleep apnea)   Peripheral neuropathy (HCC)   CKD (chronic kidney disease) stage 3, GFR 30-59 ml/min   Fever of unknown origin   . B-complex with vitamin C  1 tablet Oral Daily  . DULoxetine  60 mg Oral BID  . enoxaparin (LOVENOX) injection  40 mg Subcutaneous Q24H  . fludrocortisone  0.1 mg Oral Daily  . folic acid  1 mg Oral Daily  . gabapentin  1,200 mg Oral Daily  . gabapentin  1,600 mg Oral QHS  . lacosamide  100 mg Oral BID  . lidocaine  1 patch Transdermal Q24H  . pantoprazole  40 mg Oral Daily  . predniSONE  5 mg Oral Daily  . topiramate  50 mg Oral QHS  . traZODone  50 mg Oral QHS  . vitamin B-12  1,000 mcg Oral Daily  . Vitamin D (Ergocalciferol)  50,000 Units Oral Q7 days    SUBJECTIVE: She is not feeling any better. She continues to see you areas of rash. These red, maculopapular spots are tender to touch. She has no sores in her mouth. Her eyes are feeling a little bit gritty. She continues to have a high fever associated with chills, sweats and headache. She has had some intermittent diarrhea recently associated with nausea. Her cough has worsened over the past 48 hours. She is now bringing up yellow-green sputum and has had 2 episodes yesterday of hemoptysis. She has also had some epistaxis. She tells me that she is concerned that her Wegener's may be reactivating. She tells me that she feels like she "needs another bronchoscopy".  Review of Systems: Review of Systems  Constitutional: Positive for chills, diaphoresis, fever and malaise/fatigue. Negative for weight loss.  HENT: Negative for sore throat.   Eyes: Negative for redness.  Respiratory: Positive for cough, hemoptysis, sputum production  and shortness of breath.        She now believes that her cough is compatible with her chronic Wegener's associated cough.  Cardiovascular: Negative for chest pain.  Gastrointestinal: Positive for diarrhea and nausea. Negative for abdominal pain and vomiting.  Genitourinary: Negative for dysuria, frequency and urgency.  Musculoskeletal: Positive for joint pain and myalgias.  Skin: Positive for rash. Negative for itching.  Neurological: Positive for headaches.       Her chronic, neuropathic leg pain has improved since she had her recent pain pump changed.    Past Medical History:  Diagnosis Date  . Chronic cough   . Diabetes mellitus without complication (HCC)   . Hypertension   . OSA (obstructive sleep apnea)   . Peripheral neuropathy (HCC)   . Wegener's granulomatosis (HCC) 2009  . Wegner's disease (congenital syphilitic osteochondritis)     Social History  Substance Use Topics  . Smoking status: Never Smoker  . Smokeless tobacco: Never Used  . Alcohol use Yes     Comment: occasionally    Family History  Problem Relation Age of Onset  . Diabetes Mother   . Heart disease Mother   . Diabetes Father    Allergies  Allergen Reactions  . Motrin [Ibuprofen] Other (See Comments)    Pt is unable  to take this due to kidney problems.    . Penicillins Rash and Other (See Comments)    Has patient had a PCN reaction causing immediate rash, facial/tongue/throat swelling, SOB or lightheadedness with hypotension: No Has patient had a PCN reaction causing severe rash involving mucus membranes or skin necrosis: No Has patient had a PCN reaction that required hospitalization No Has patient had a PCN reaction occurring within the last 10 years: No If all of the above answers are "NO", then may proceed with Cephalosporin use.    OBJECTIVE: Vitals:   07/11/16 2214 07/12/16 0248 07/12/16 0542 07/12/16 1056  BP: 116/62 (!) 101/59 105/60 118/77  Pulse: 80 75 80 78  Resp: 18 20 18 18     Temp: 100.2 F (37.9 C) 100 F (37.8 C) 99.2 F (37.3 C) 99.2 F (37.3 C)  TempSrc: Oral Oral Oral Oral  SpO2: 100% 100% 94% 96%  Weight:      Height:       Body mass index is 39.46 kg/m.  Physical Exam  Constitutional: She is oriented to person, place, and time.  She is in no distress but appears uncomfortable and slightly frustrated. Her husband is at the bedside.  HENT:  Mouth/Throat: No oropharyngeal exudate.  Eyes: Conjunctivae are normal.  She has some new, slight conjunctival redness on the left.  Neck: Neck supple.  Cardiovascular: Normal rate and regular rhythm.   No murmur heard. Pulmonary/Chest: Effort normal and breath sounds normal. She has no wheezes. She has no rales.  Abdominal: Soft. There is no tenderness.  Left upper quadrant pump site looks good with healing incision and no evidence of infection.  Musculoskeletal: Normal range of motion. She exhibits tenderness. She exhibits no edema.  Her right knee remains tender with palpation and range of motion but there is no redness, unusual warmth or swelling.  Neurological: She is alert and oriented to person, place, and time.  Skin: Rash noted. There is erythema.  Her maculopapular rash is a little less erythematous but a little more diffuse.  Psychiatric: Mood and affect normal.    Lab Results Lab Results  Component Value Date   WBC 11.2 (H) 07/11/2016   HGB 10.8 (L) 07/11/2016   HCT 33.1 (L) 07/11/2016   MCV 90.4 07/11/2016   PLT 295 07/11/2016    Lab Results  Component Value Date   CREATININE 0.86 07/11/2016   BUN 7 07/11/2016   NA 140 07/11/2016   K 3.5 07/11/2016   CL 110 07/11/2016   CO2 24 07/11/2016    Lab Results  Component Value Date   ALT 54 07/10/2016   AST 70 (H) 07/10/2016   ALKPHOS 140 (H) 07/10/2016   BILITOT 0.3 07/10/2016    Sed Rate (mm/hr)  Date Value  07/09/2016 30 (H)   CRP (mg/dL)  Date Value  91/47/8295 8.8 (H)   Serum cryptococcal antigen: Negative RPR:  Nonreactive Myeloperoxidase: Negative  Microbiology: Recent Results (from the past 240 hour(s))  Urine culture     Status: Abnormal   Collection Time: 07/04/16  2:00 PM  Result Value Ref Range Status   Specimen Description URINE, CLEAN CATCH  Final   Special Requests NONE  Final   Culture 60,000 COLONIES/mL ENTEROCOCCUS FAECALIS (A)  Final   Report Status 07/06/2016 FINAL  Final   Organism ID, Bacteria ENTEROCOCCUS FAECALIS (A)  Final      Susceptibility   Enterococcus faecalis - MIC*    AMPICILLIN <=2 SENSITIVE Sensitive  LEVOFLOXACIN 0.5 SENSITIVE Sensitive     NITROFURANTOIN <=16 SENSITIVE Sensitive     VANCOMYCIN 1 SENSITIVE Sensitive     * 60,000 COLONIES/mL ENTEROCOCCUS FAECALIS  Blood culture (routine x 2)     Status: None   Collection Time: 07/04/16  2:39 PM  Result Value Ref Range Status   Specimen Description BLOOD LEFT HAND  Final   Special Requests BOTTLES DRAWN AEROBIC ONLY 5 CC  Final   Culture   Final    NO GROWTH 5 DAYS Performed at [facility]    Report Status 07/09/2016 FINAL  Final  Blood culture (routine x 2)     Status: None   Collection Time: 07/04/16  2:54 PM  Result Value Ref Range Status   Specimen Description BLOOD RIGHT ANTECUBITAL  Final   Special Requests BOTTLES DRAWN AEROBIC AND ANAEROBIC 5 CC EA  Final   Culture   Final    NO GROWTH 5 DAYS Performed at [facility]    Report Status 07/09/2016 FINAL  Final  MRSA PCR Screening     Status: None   Collection Time: 07/06/16 10:07 PM  Result Value Ref Range Status   MRSA by PCR NEGATIVE NEGATIVE Final    Comment:        The GeneXpert MRSA Assay (FDA approved for NASAL specimens only), is one component of a comprehensive MRSA colonization surveillance program. It is not intended to diagnose MRSA infection nor to guide or monitor treatment for MRSA infections.   Culture, blood (routine x 2)     Status: None   Collection Time: 07/07/16  7:50 AM  Result Value Ref Range  Status   Specimen Description BLOOD RIGHT HAND  Final   Special Requests BOTTLES DRAWN AEROBIC ONLY 5CC  Final   Culture   Final    NO GROWTH 5 DAYS Performed at [facility]    Report Status 07/12/2016 FINAL  Final  Culture, blood (routine x 2)     Status: None   Collection Time: 07/07/16  7:50 AM  Result Value Ref Range Status   Specimen Description BLOOD RIGHT ARM  Final   Special Requests BOTTLES DRAWN AEROBIC ONLY 5CC  Final   Culture   Final    NO GROWTH 5 DAYS Performed at [facility]    Report Status 07/12/2016 FINAL  Final  Culture, blood (routine x 2)     Status: None (Preliminary result)   Collection Time: 07/09/16  2:18 AM  Result Value Ref Range Status   Specimen Description BLOOD LEFT ARM  Final   Special Requests IN PEDIATRIC BOTTLE 5CC  Final   Culture   Final    NO GROWTH 3 DAYS Performed at [facility]    Report Status PENDING  Incomplete  Culture, blood (routine x 2)     Status: None (Preliminary result)   Collection Time: 07/09/16  2:36 AM  Result Value Ref Range Status   Specimen Description BLOOD RIGHT ANTECUBITAL  Final   Special Requests BOTTLES DRAWN AEROBIC ONLY  Final   Culture   Final    NO GROWTH 3 DAYS Performed at [facility]    Report Status PENDING  Incomplete     ASSESSMENT: The cause of her fever, rash and leukocytosis remains unclear. All blood cultures are negative. The results of her skin biopsy showed nonspecific benign findings. She has developed recurrent epistaxis and possible hemoptysis (the blood in her sputum may be related to  the epistaxis rather than true hemoptysis). Her sedimentation rate and C-reactive protein are elevated but are quite nonspecific. Her myeloperoxidase was negative and ANCA is pending. She is concerned about reactivation of her Wegener's. She has never had rash or high, protracted fevers related to her Wegener's. I talked to her about the uncertainty over what is causing all  of this. We have chosen not to empirically put her on higher doses of steroids but this may need to be considered soon. [facility] is [facility] where she can be reevaluated by her rheumatologist.  PLAN: 1. Observe off of antibiotics 2. [name]   Cliffton AstersJohn Ossie Yebra, MD [facility] [phone] pager   [phone] cell

## 2016-07-12 NOTE — Discharge Summary (Signed)
Physician Discharge Summary  Stacey CromerLisa [name] [id number] DOB: January 06, 1966 DOA: 07/04/2016  PCP: No PCP Per Patient  Admit date: 07/04/2016 Discharge date: 07/12/2016   Recommendations for Outpatient Follow-Up:   1. To [facility]   Discharge Diagnosis:   Principal Problem:   Fever Active Problems:   Wegner's disease (congenital syphilitic osteochondritis)   Adrenal insufficiency (HCC)   OSA (obstructive sleep apnea)   Peripheral neuropathy (HCC)   Hypotension   Rash   CKD (chronic kidney disease) stage 3, GFR 30-59 ml/min   Fever of unknown origin   Discharge disposition:  [facility]  Discharge Condition: stable  Diet recommendation:   Regular.  Wound care: None.   History of Present Illness:   Stacey CromerLisa [name] is a 50 y.o. 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  She has a chronic, painful peripheral neuropathy related to prior therapy for Wegener's. She recently had a new pain pump placed in her abdomen.Several weeks ago she developed bronchitis and was started on Omnicef by her family practitioner on 06/21/2016. She completed antibiotic therapy  Last week. Three days ago she began to notice a somewhat painful red rash that has spread and generalized.  One of the larger lesions on her left hip "busted" and drained some pus.  She has been having fever at home to as high as 101.5. She has been dizzy on standing. She had one episode of nausea and vomiting this morning.  She saw her family practitioner again this morning. She was hypotensive and was advised to come to emergency department for further evaluation.  Hospital Course by Problem:   Fever / Generalized maculopapular rash / Hypotension - Patient presented with fever, generalized painful rash and hypotension.  - Patient has history of adrenal insufficiency and is on Florinef as well as low dose prednisone, which will continue: steroids discussed with Dr. Orvan Falconerampbell-- will hold for now until seen by  specialist - Presentation with fever and generalized rash was worrisome for acute infectious process, but no infection has been found to date - blood culture results: No growth to date, repeat blood cultures done 9/14: NGTD - general surgery for skin biopsy and I&D small abscess done 9/15.   Skin Biopsy Results: Diagnosis 1. Skin , left distal thigh: BENIGN SQUAMOUS EPITHELIUM AND SUBMUCOSA WITH SCAR 2. 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  Adrenal insufficiency (HCC) - Continue Florinef, prednisone  Fever Unknown Cause - Blood cultures repeated 9/14 No growth to date - Tylenol being given around the clock  Enterococcus UTI  Pt was treated with vancomycin x 3 doses  Hypokalemia - replacing orally, recheck in AM, add magnesium test  Chronic Pain - Pt has implanted pain pump for intrathecal prialt administration (placed Aug 2017) by Dr. Elberta LeatherwoodQadri ([facility]) -attending spoke with Dr Jeanice LimQuadri 9/14.   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  She was hypotensive on admission but that has resolved.   CKD (chronic kidney disease) stage 3, GFR 30-59 ml/min - Baseline creatinine about 3 years ago is 1.14 - Creatinine on this admission is 1.3.  Holding home lasix for now as this may have contributed to worsening renal function      Medical Consultants:    General surgery  ID   Discharge Exam:   Vitals:   07/12/16 0542 07/12/16 1056  BP: 105/60 118/77  Pulse: 80 78  Resp: 18 18  Temp: 99.2 F (37.3 C) 99.2 F (37.3 C)   Vitals:   07/11/16 2214 07/12/16 0248 07/12/16 0542 07/12/16 1056  BP: 116/62 (!) 101/59 105/60 118/77  Pulse: 80 75 80 78  Resp: 18 20 18 18   Temp: 100.2 F (37.9 C) 100 F (37.8 C) 99.2 F (37.3 C) 99.2 F (37.3 C)  TempSrc: Oral Oral Oral Oral  SpO2: 100% 100% 94% 96%  Weight:      Height:        Gen:  NAD Redness on left eye Lungs clear    The results of significant diagnostics from this hospitalization (including imaging, microbiology, ancillary and laboratory) are listed below for reference.     Procedures and Diagnostic Studies:   Dg Chest 2 View  Result Date: 07/04/2016 CLINICAL DATA:  Cough.  Fevers.  Rash. EXAM: CHEST  2 VIEW COMPARISON:  06/07/2016 chest radiograph. FINDINGS: Stable cardiomediastinal silhouette with normal heart size. No pneumothorax. No pleural effusion. Lungs appear clear, with no acute consolidative airspace disease and no pulmonary edema. Minimal thoracic spondylosis. Cholecystectomy clips are seen in the right upper quadrant of the abdomen. IMPRESSION: No active cardiopulmonary disease. Electronically Signed   By: Delbert Phenix M.D.   On: 07/04/2016 14:44     Labs:   Basic Metabolic Panel:  Recent Labs Lab 07/07/16 0539 07/08/16 0534 07/09/16 1049 07/10/16 0506 07/11/16 0537  NA 136 142 136 139 140  K  3.3* 3.5 3.1* 3.0* 3.5  CL 109 115* 106 109 110  CO2 21* 21* 23 24 24   GLUCOSE 100* 136* 110* 119* 101*  BUN 6 8 8 9 7   CREATININE 1.03* 0.81 1.05* 0.88 0.86  CALCIUM 7.9* 8.7* 8.1* 8.0* 8.2*  MG  --   --   --  1.8  --    GFR Estimated Creatinine Clearance: 98.7 mL/min (by C-G formula based on SCr of 0.86 mg/dL). Liver Function Tests:  Recent Labs Lab 07/08/16 0534 07/10/16 0506  AST 52* 70*  ALT 47 54  ALKPHOS 136* 140*  BILITOT 0.4 0.3  PROT 6.0* 5.4*  ALBUMIN 2.8* 2.5*   No results for input(s): LIPASE, AMYLASE in the last 168 hours. No results for input(s): AMMONIA in the last 168 hours. Coagulation profile No results for input(s): INR, PROTIME in the last 168 hours.  CBC:  Recent Labs Lab 07/07/16 1130 07/08/16 0534 07/09/16 1514 07/10/16 0506 07/11/16 0537  WBC 9.2 7.6 14.1* 10.4 11.2*  NEUTROABS  --  4.0 5.5 4.9 4.7  HGB 11.2* 11.4* 11.3* 10.5* 10.8*  HCT 34.4* 34.1* 35.4* 31.8* 33.1*  MCV 89.8 88.6 92.2 90.1 90.4  PLT 236 244 270 256 295   Cardiac Enzymes: No results for input(s): CKTOTAL, CKMB, CKMBINDEX, TROPONINI in the last 168 hours. BNP: Invalid input(s): POCBNP CBG:  Recent Labs Lab 07/06/16 0746 07/08/16 0807 07/10/16 0738 07/11/16 0746 07/12/16 0722  GLUCAP 131* 111* 87 94 101*   D-Dimer No results for input(s): DDIMER in the last 72 hours. Hgb A1c No results for input(s): HGBA1C in the last 72 hours. Lipid Profile No results for input(s): CHOL, HDL, LDLCALC, TRIG, CHOLHDL, LDLDIRECT in the last 72 hours. Thyroid function studies No results for input(s): TSH, T4TOTAL, T3FREE, THYROIDAB in the last 72 hours.  Invalid input(s): FREET3 Anemia work up No results for input(s): VITAMINB12, FOLATE, FERRITIN, TIBC, IRON, RETICCTPCT in the last 72 hours. Microbiology Recent Results (from the past 240 hour(s))  Urine culture     Status: Abnormal   Collection Time: 07/04/16  2:00 PM  Result Value Ref Range Status   Specimen  Description URINE, CLEAN CATCH  Final   Special Requests NONE  Final   Culture 60,000 COLONIES/mL ENTEROCOCCUS FAECALIS (A)  Final   Report Status 07/06/2016 FINAL  Final   Organism ID, Bacteria ENTEROCOCCUS FAECALIS (A)  Final      Susceptibility   Enterococcus faecalis - MIC*    AMPICILLIN <=2 SENSITIVE Sensitive     LEVOFLOXACIN 0.5 SENSITIVE Sensitive     NITROFURANTOIN <=16 SENSITIVE Sensitive     VANCOMYCIN 1 SENSITIVE Sensitive     * 60,000 COLONIES/mL ENTEROCOCCUS FAECALIS  Blood culture (routine x 2)     Status: None   Collection Time: 07/04/16  2:39 PM  Result Value Ref Range Status   Specimen Description BLOOD LEFT HAND  Final   Special Requests BOTTLES DRAWN AEROBIC ONLY 5 CC  Final   Culture   Final    NO GROWTH 5 DAYS Performed at [facility]    Report Status 07/09/2016 FINAL  Final  Blood culture (routine x 2)     Status: None   Collection Time: 07/04/16  2:54 PM  Result Value Ref Range Status   Specimen Description BLOOD RIGHT ANTECUBITAL  Final   Special Requests BOTTLES DRAWN AEROBIC AND ANAEROBIC 5 CC EA  Final   Culture   Final    NO GROWTH 5 DAYS Performed at [facility]    Report Status 07/09/2016 FINAL  Final  MRSA PCR Screening     Status: None   Collection Time: 07/06/16 10:07 PM  Result Value Ref Range Status   MRSA by PCR NEGATIVE NEGATIVE Final    Comment:        The GeneXpert MRSA Assay (FDA approved for NASAL specimens only), is one component of a comprehensive MRSA colonization surveillance program. It is not intended to diagnose MRSA infection nor to guide or monitor treatment for MRSA infections.   Culture, blood (routine x 2)     Status: None   Collection Time: 07/07/16  7:50 AM  Result Value Ref Range Status   Specimen Description BLOOD RIGHT HAND  Final   Special Requests BOTTLES DRAWN AEROBIC ONLY 5CC  Final   Culture   Final    NO GROWTH 5 DAYS Performed at [facility]    Report Status 07/12/2016  FINAL  Final  Culture, blood (routine x 2)     Status: None   Collection Time: 07/07/16  7:50 AM  Result Value Ref Range Status   Specimen Description BLOOD RIGHT  ARM  Final   Special Requests BOTTLES DRAWN AEROBIC ONLY 5CC  Final   Culture   Final    NO GROWTH 5 DAYS Performed at [facility]    Report Status 07/12/2016 FINAL  Final  Culture, blood (routine x 2)     Status: None (Preliminary result)   Collection Time: 07/09/16  2:18 AM  Result Value Ref Range Status   Specimen Description BLOOD LEFT ARM  Final   Special Requests IN PEDIATRIC BOTTLE 5CC  Final   Culture   Final    NO GROWTH 3 DAYS Performed at [facility]    Report Status PENDING  Incomplete  Culture, blood (routine x 2)     Status: None (Preliminary result)   Collection Time: 07/09/16  2:36 AM  Result Value Ref Range Status   Specimen Description BLOOD RIGHT ANTECUBITAL  Final   Special Requests BOTTLES DRAWN AEROBIC ONLY  Final   Culture   Final    NO GROWTH 3 DAYS Performed at [facility]    Report Status PENDING  Incomplete     Discharge Instructions:   Discharge Instructions    Discharge instructions    Complete by:  As directed    Transfer to [facility] for specialist care       Medication List    STOP taking these medications   esomeprazole 40 MG capsule Commonly known as:  NEXIUM   furosemide 40 MG tablet Commonly known as:  LASIX   hydrochlorothiazide 25 MG tablet Commonly known as:  HYDRODIURIL   losartan 25 MG tablet Commonly known as:  COZAAR   methotrexate 250 MG/10ML injection   nystatin powder Generic drug:  nystatin   nystatin-triamcinolone cream Commonly known as:  MYCOLOG II   potassium chloride SA 20 MEQ tablet Commonly known as:  K-DUR,KLOR-CON   sulfamethoxazole-trimethoprim 800-160 MG per tablet Commonly known as:  BACTRIM DS   traMADol 50 MG tablet Commonly known as:  ULTRAM     TAKE these medications   acetaminophen 325 MG  tablet Commonly known as:  TYLENOL Take 2 tablets (650 mg total) by mouth every 6 (six) hours as needed for mild pain (or Fever >/= 101).   albuterol 108 (90 Base) MCG/ACT inhaler Commonly known as:  PROVENTIL HFA;VENTOLIN HFA Inhale 2 puffs into the lungs every 6 (six) hours as needed for wheezing or shortness of breath.   b complex vitamins tablet Take 1 tablet by mouth daily.   dextromethorphan-guaiFENesin 30-600 MG 12hr tablet Commonly known as:  MUCINEX DM Take 1 tablet by mouth 2 (two) times daily as needed for cough.   DULoxetine 60 MG capsule Commonly known as:  CYMBALTA Take 60 mg by mouth 2 (two) times daily.   fludrocortisone 0.1 MG tablet Commonly known as:  FLORINEF Take 0.1 mg by mouth daily.   fluticasone 50 MCG/ACT nasal spray Commonly known as:  FLONASE Place 2 sprays into both nostrils daily as needed for rhinitis.   folic acid 1 MG tablet Commonly known as:  FOLVITE Take 1 mg by mouth daily.   gabapentin 300 MG capsule Commonly known as:  NEURONTIN Take 1,200-1,600 mg by mouth 2 (two) times daily. Pt takes three capsules in the morning and four at night.   HYDROcodone-acetaminophen 5-325 MG tablet Commonly known as:  NORCO/VICODIN Take 1-2 tablets by mouth every 4 (four) hours as needed for moderate pain or severe pain.   lidocaine 5 % Commonly known as:  LIDODERM Place 1  patch onto the skin daily as needed (for pain). Remove & Discard patch within 12 hours or as directed by MD   oxyCODONE 5 MG immediate release tablet Commonly known as:  Oxy IR/ROXICODONE Take 1-2 tablets (5-10 mg total) by mouth every 4 (four) hours as needed for moderate pain.   pantoprazole 40 MG tablet Commonly known as:  PROTONIX Take 40 mg by mouth daily.   predniSONE 5 MG tablet Commonly known as:  DELTASONE Take 5 mg by mouth daily.   topiramate 50 MG tablet Commonly known as:  TOPAMAX Take 50 mg by mouth at bedtime.   traZODone 50 MG tablet Commonly known as:   DESYREL Take 50 mg by mouth at bedtime.   VIMPAT 100 MG Tabs Generic drug:  Lacosamide Take 100 mg by mouth 2 (two) times daily.   vitamin B-12 1000 MCG tablet Commonly known as:  CYANOCOBALAMIN Take 1,000 mcg by mouth daily.   Vitamin D (Ergocalciferol) 50000 units Caps capsule Commonly known as:  DRISDOL Take 50,000 Units by mouth every 7 (seven) days. Pt takes on Friday.      Follow-up Information    Ernestene Mention, MD Follow up on 07/17/2016.   Specialty:  General Surgery Why:  for suture removal  Contact information: [address] [place] [address] [phone]            Time coordinating discharge: 35 min  Signed:  Vika Buske U Makensey Rego   [facility] 07/12/2016, 1:50 PM

## 2016-07-13 LAB — EPSTEIN-BARR VIRUS NUCLEAR ANTIGEN ANTIBODY, IGG: EBV NA IGG: 78.6 U/mL — AB (ref 0.0–17.9)

## 2016-07-13 LAB — CMV IGM: CMV IgM: <30 AU/mL (ref 0.0–29.9)

## 2016-07-13 LAB — EPSTEIN-BARR VIRUS EARLY D ANTIGEN ANTIBODY, IGG

## 2016-07-13 LAB — EPSTEIN-BARR VIRUS VCA, IGG: EBV VCA IgG: >600 U/mL — ABNORMAL HIGH (ref 0.0–17.9)

## 2016-07-13 LAB — EPSTEIN-BARR VIRUS VCA, IGM

## 2016-07-13 LAB — GLUCOSE, CAPILLARY: GLUCOSE-CAPILLARY: 118 mg/dL — AB (ref 65–99)

## 2016-07-13 MED ORDER — PREDNISONE 10 MG PO TABS
30.0000 mg | ORAL_TABLET | Freq: Every day | ORAL | Status: DC
  Administered 2016-07-13 – 2016-07-14 (×2): 30 mg via ORAL
  Filled 2016-07-13 (×2): qty 1

## 2016-07-13 NOTE — Progress Notes (Signed)
07/13/2016 2:33 PM  PROGRESS NOTE   [name]  VWU:[id number]  DOB: July 04, 1966  DOA: 07/04/2016 PCP: No PCP Per Patient  Hospital course: [name] is a 50 y.o. 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 She has been seen by primary care physician who referred her to emergency room for evaluation. Patient reported that rash started initially on lower extremities and then spread to her trunk. She reports no exposure to ticks. No reports of IV drug abuse. She did not know what the maximum temperature was but her fever has been intermittent and worse over past 24 hours. No reports of chest pain or shortness of breath. No cough.  No abdominal pain, nausea, vomiting or diarrhea.  9/19: Patient and husband want to be transferred to [facility].    Assessment & Plan:     Fever / Generalized maculopapular rash / Hypotension - Patient presented with fever, generalized painful rash and hypotension. She she has required IV fluids on the admission which has temporarily normalize her blood pressure to 108/73 but her current BP again 91/57.  Patient has history of adrenal insufficiency and is on Florinef as well as low dose prednisone, which will continue. - Presentation with fever and generalized rash was worrisome for acute infectious process, but no infection has been found to date - Seen by infectious disease received vancomycin x 3 days, discontinued on 9/14.  - Follow-up blood culture results: No growth to date, repeat blood cultures done 9/14: NGTD - Hypotension - resolved now. - general surgery for skin biopsy and I&D small abscess done 9/15.   Skin Biopsy Results: Diagnosis 1. Skin , left distal thigh: BENIGN SQUAMOUS EPITHELIUM AND SUBMUCOSA WITH SCAR 2. 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    Adrenal insufficiency (HCC) - Continue Florinef, prednisone  Fever Unknown Cause - Blood cultures repeated 9/14 No growth to date - Tylenol being given around the clock  Enterococcus UTI  Pt was treated with vancomycin x 3 doses  Hypokalemia - replacing orally, recheck in AM, add magnesium test  Chronic Pain - Pt has implanted pain pump for intrathecal prialt administration (placed Aug 2017) by Dr. Elberta Leatherwood ([facility]) -attending spoke with Dr Jeanice Lim 9/14.   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  She was hypotensive  on admission but that has resolved.   CKD (chronic kidney disease) stage 3, GFR 30-59 ml/min - Baseline creatinine about 3 years ago is 1.14 - Creatinine on this admission is 1.3.  Holding home lasix for now as this may have contributed to  worsening renal function   DVT prophylaxis: lovenox  Code Status: full code  Family Communication: husband at the bedside   Disposition Plan: await bed availability at [facility]  Antimicrobials: Anti-infectives    Start     Dose/Rate Route Frequency Ordered Stop   07/05/16 1600  vancomycin (VANCOCIN) 1,250 mg in sodium chloride 0.9 % 250 mL IVPB  Status:  Discontinued     1,250 mg 166.7 mL/hr over 90 Minutes Intravenous Every 24 hours 07/04/16 1627 07/05/16 0828   07/05/16 0900  vancomycin (VANCOCIN) IVPB 750 mg/150 ml premix  Status:  Discontinued     750 mg 150 mL/hr over 60 Minutes Intravenous Every 12 hours 07/05/16 0828 07/06/16 1627   07/04/16 1630  vancomycin (VANCOCIN) 1,500 mg in sodium chloride 0.9 % 500 mL IVPB     1,500 mg 250 mL/hr over 120 Minutes Intravenous  Once 07/04/16 1618 07/04/16 1903     Subjective: Frustrated that transfer is taking so long  Objective: Vitals:   07/12/16 1355 07/12/16 1833 07/12/16 2055 07/13/16 0534  BP: (!) 132/94 104/68 (!) 105/59 109/72  Pulse: 81 76 72 71  Resp: (!) 21 18 18 18   Temp: 99.1 F (37.3 C) 100.1 F (37.8 C) 98.4 F (36.9 C) 98 F (36.7 C)  TempSrc: Oral Oral Oral Oral  SpO2: 97% 93% 96% 99%  Weight:      Height:       No intake or output data in the 24 hours ending 07/13/16 1433 Filed Weights   07/04/16 1234 07/04/16 1838 07/11/16 0549  Weight: 103.9 kg (229 lb) 106.5 kg (234 lb 12.6 oz) 110.9 kg (244 lb 7.8 oz)   Exam:  Respiratory: clear to auscultation bilaterally, no wheezing, no crackles. Normal respiratory effort. No accessory muscle use.  Cardiovascular: Regular rate and rhythm, no murmurs / rubs / gallops. No extremity edema. 2+ pedal pulses. No carotid bruits.  Abdomen: no tenderness, no masses palpated. No hepatosplenomegaly. Bowel sounds positive.  Musculoskeletal: no clubbing / cyanosis. No joint deformity upper and lower extremities. Good ROM, no contractures. Normal muscle tone.  Skin: sporadic  macular rash over trunk, lower extremities. Skin is warm and dry.   Data Reviewed: Basic Metabolic Panel:  Recent Labs Lab 07/07/16 0539 07/08/16 0534 07/09/16 1049 07/10/16 0506 07/11/16 0537  NA 136 142 136 139 140  K 3.3* 3.5 3.1* 3.0* 3.5  CL 109 115* 106 109 110  CO2 21* 21* 23 24 24   GLUCOSE 100* 136* 110* 119* 101*  BUN 6 8 8 9 7   CREATININE 1.03* 0.81 1.05* 0.88 0.86  CALCIUM 7.9* 8.7* 8.1* 8.0* 8.2*  MG  --   --   --  1.8  --    Liver Function Tests:  Recent Labs Lab 07/08/16 0534 07/10/16 0506  AST 52* 70*  ALT 47 54  ALKPHOS 136* 140*  BILITOT 0.4 0.3  PROT 6.0* 5.4*  ALBUMIN 2.8* 2.5*   No results for input(s): LIPASE, AMYLASE in the last 168 hours. No results for input(s): AMMONIA in the last 168 hours. CBC:  Recent Labs Lab 07/07/16 1130 07/08/16 0534 07/09/16 1514 07/10/16 0506 07/11/16 0537  WBC 9.2 7.6 14.1* 10.4 11.2*  NEUTROABS  --  4.0 5.5  4.9 4.7  HGB 11.2* 11.4* 11.3* 10.5* 10.8*  HCT 34.4* 34.1* 35.4* 31.8* 33.1*  MCV 89.8 88.6 92.2 90.1 90.4  PLT 236 244 270 256 295   Cardiac Enzymes: No results for input(s): CKTOTAL, CKMB, CKMBINDEX, TROPONINI in the last 168 hours. CBG (last 3)   Recent Labs  07/11/16 0746 07/12/16 0722 07/13/16 0802  GLUCAP 94 101* 118*   Recent Results (from the past 240 hour(s))  Urine culture     Status: Abnormal   Collection Time: 07/04/16  2:00 PM  Result Value Ref Range Status   Specimen Description URINE, CLEAN CATCH  Final   Special Requests NONE  Final   Culture 60,000 COLONIES/mL ENTEROCOCCUS FAECALIS (A)  Final   Report Status 07/06/2016 FINAL  Final   Organism ID, Bacteria ENTEROCOCCUS FAECALIS (A)  Final      Susceptibility   Enterococcus faecalis - MIC*    AMPICILLIN <=2 SENSITIVE Sensitive     LEVOFLOXACIN 0.5 SENSITIVE Sensitive     NITROFURANTOIN <=16 SENSITIVE Sensitive     VANCOMYCIN 1 SENSITIVE Sensitive     * 60,000 COLONIES/mL ENTEROCOCCUS FAECALIS  Blood culture (routine  x 2)     Status: None   Collection Time: 07/04/16  2:39 PM  Result Value Ref Range Status   Specimen Description BLOOD LEFT HAND  Final   Special Requests BOTTLES DRAWN AEROBIC ONLY 5 CC  Final   Culture   Final    NO GROWTH 5 DAYS Performed at [facility]    Report Status 07/09/2016 FINAL  Final  Blood culture (routine x 2)     Status: None   Collection Time: 07/04/16  2:54 PM  Result Value Ref Range Status   Specimen Description BLOOD RIGHT ANTECUBITAL  Final   Special Requests BOTTLES DRAWN AEROBIC AND ANAEROBIC 5 CC EA  Final   Culture   Final    NO GROWTH 5 DAYS Performed at [facility]    Report Status 07/09/2016 FINAL  Final  MRSA PCR Screening     Status: None   Collection Time: 07/06/16 10:07 PM  Result Value Ref Range Status   MRSA by PCR NEGATIVE NEGATIVE Final    Comment:        The GeneXpert MRSA Assay (FDA approved for NASAL specimens only), is one component of a comprehensive MRSA colonization surveillance program. It is not intended to diagnose MRSA infection nor to guide or monitor treatment for MRSA infections.   Culture, blood (routine x 2)     Status: None   Collection Time: 07/07/16  7:50 AM  Result Value Ref Range Status   Specimen Description BLOOD RIGHT HAND  Final   Special Requests BOTTLES DRAWN AEROBIC ONLY 5CC  Final   Culture   Final    NO GROWTH 5 DAYS Performed at [facility]    Report Status 07/12/2016 FINAL  Final  Culture, blood (routine x 2)     Status: None   Collection Time: 07/07/16  7:50 AM  Result Value Ref Range Status   Specimen Description BLOOD RIGHT ARM  Final   Special Requests BOTTLES DRAWN AEROBIC ONLY 5CC  Final   Culture   Final    NO GROWTH 5 DAYS Performed at [facility]    Report Status 07/12/2016 FINAL  Final  Culture, blood (routine x 2)     Status: None (Preliminary result)   Collection Time: 07/09/16  2:18 AM  Result Value Ref Range Status  Specimen Description BLOOD  LEFT ARM  Final   Special Requests IN PEDIATRIC BOTTLE 5CC  Final   Culture   Final    NO GROWTH 3 DAYS Performed at [facility]    Report Status PENDING  Incomplete  Culture, blood (routine x 2)     Status: None (Preliminary result)   Collection Time: 07/09/16  2:36 AM  Result Value Ref Range Status   Specimen Description BLOOD RIGHT ANTECUBITAL  Final   Special Requests BOTTLES DRAWN AEROBIC ONLY 6ML  Final   Culture   Final    NO GROWTH 3 DAYS Performed at [facility]    Report Status PENDING  Incomplete    Studies: No results found. Scheduled Meds: . B-complex with vitamin C  1 tablet Oral Daily  . DULoxetine  60 mg Oral BID  . enoxaparin (LOVENOX) injection  40 mg Subcutaneous Q24H  . fludrocortisone  0.1 mg Oral Daily  . folic acid  1 mg Oral Daily  . gabapentin  1,200 mg Oral Daily  . gabapentin  1,600 mg Oral QHS  . lacosamide  100 mg Oral BID  . lidocaine  1 patch Transdermal Q24H  . pantoprazole  40 mg Oral Daily  . predniSONE  5 mg Oral Daily  . topiramate  50 mg Oral QHS  . traZODone  50 mg Oral QHS  . vitamin B-12  1,000 mcg Oral Daily  . Vitamin D (Ergocalciferol)  50,000 Units Oral Q7 days   Continuous Infusions: . sodium chloride 10 mL/hr at 07/08/16 1230  . lactated ringers 100 mL/hr at 07/07/16 1710   Principal Problem:   Fever Active Problems:   Wegner's disease (congenital syphilitic osteochondritis)   Adrenal insufficiency (HCC)   OSA (obstructive sleep apnea)   Peripheral neuropathy (HCC)   Hypotension   Rash   CKD (chronic kidney disease) stage 3, GFR 30-59 ml/min   Fever of unknown origin  Time spent:   Joseph ArtJESSICA U Pelagia Iacobucci, DO [facility] Pager [phone]  If 7PM-7AM, please contact night-coverage [email or web address] Password [email or web address] 07/13/2016, 2:33 PM    LOS: 8 days

## 2016-07-13 NOTE — Progress Notes (Signed)
Patient ID: Stacey CromerLisa [name], female   DOB: 08-08-1966, 50 y.o.   MRN: [id number] Wounds both clean, sutures can come out next week, continue dressing changes, we can see back when she returns as needed

## 2016-07-13 NOTE — Progress Notes (Signed)
[facility]    Date of Admission:  07/04/2016             Principal Problem:   Fever Active Problems:   Hypotension   Rash   Wegner's disease (congenital syphilitic osteochondritis)   Adrenal insufficiency (HCC)   OSA (obstructive sleep apnea)   Peripheral neuropathy (HCC)   CKD (chronic kidney disease) stage 3, GFR 30-59 ml/min   Fever of unknown origin   . B-complex with vitamin C  1 tablet Oral Daily  . DULoxetine  60 mg Oral BID  . enoxaparin (LOVENOX) injection  40 mg Subcutaneous Q24H  . fludrocortisone  0.1 mg Oral Daily  . folic acid  1 mg Oral Daily  . gabapentin  1,200 mg Oral Daily  . gabapentin  1,600 mg Oral QHS  . lacosamide  100 mg Oral BID  . lidocaine  1 patch Transdermal Q24H  . pantoprazole  40 mg Oral Daily  . predniSONE  5 mg Oral Daily  . topiramate  50 mg Oral QHS  . traZODone  50 mg Oral QHS  . vitamin B-12  1,000 mcg Oral Daily  . Vitamin D (Ergocalciferol)  50,000 Units Oral Q7 days    SUBJECTIVE: She states that she is feeling no better. She continues to ache all over and feels like the rash may be a little bit worse. She has felt feverish but has not had any recorded fevers in the past 24 hours.  Review of Systems: Review of Systems  Constitutional: Positive for chills, fever and malaise/fatigue. Negative for diaphoresis and weight loss.  HENT: Negative for nosebleeds and sore throat.   Eyes: Negative for redness.  Respiratory: Positive for cough and sputum production. Negative for hemoptysis and shortness of breath.        She now believes that her cough is compatible with her chronic Wegener's associated cough.  Cardiovascular: Negative for chest pain.  Gastrointestinal: Negative for abdominal pain, diarrhea, nausea and vomiting.  Genitourinary: Negative for dysuria, frequency and urgency.  Musculoskeletal: Positive for joint pain and myalgias.  Skin: Positive for rash. Negative for itching.    Neurological: Positive for headaches.       Her chronic, neuropathic leg pain has improved since she had her recent pain pump changed.    Past Medical History:  Diagnosis Date  . Chronic cough   . Diabetes mellitus without complication (HCC)   . Hypertension   . OSA (obstructive sleep apnea)   . Peripheral neuropathy (HCC)   . Wegener's granulomatosis (HCC) 2009  . Wegner's disease (congenital syphilitic osteochondritis)     Social History  Substance Use Topics  . Smoking status: Never Smoker  . Smokeless tobacco: Never Used  . Alcohol use Yes     Comment: occasionally    Family History  Problem Relation Age of Onset  . Diabetes Mother   . Heart disease Mother   . Diabetes Father    Allergies  Allergen Reactions  . Motrin [Ibuprofen] Other (See Comments)    Pt is unable to take this due to kidney problems.    . Penicillins Rash and Other (See Comments)    Has patient had a PCN reaction causing immediate rash, facial/tongue/throat swelling, SOB or lightheadedness with hypotension: No Has patient had a PCN reaction causing severe rash involving mucus membranes or skin necrosis: No Has patient had a PCN reaction that required hospitalization No Has patient had a PCN reaction  occurring within the last 10 years: No If all of the above answers are "NO", then may proceed with Cephalosporin use.    OBJECTIVE: Vitals:   07/12/16 1355 07/12/16 1833 07/12/16 2055 07/13/16 0534  BP: (!) 132/94 104/68 (!) 105/59 109/72  Pulse: 81 76 72 71  Resp: (!) 21 18 18 18   Temp: 99.1 F (37.3 C) 100.1 F (37.8 C) 98.4 F (36.9 C) 98 F (36.7 C)  TempSrc: Oral Oral Oral Oral  SpO2: 97% 93% 96% 99%  Weight:      Height:       Body mass index is 39.46 kg/m.  Physical Exam  Constitutional: She is oriented to person, place, and time.  She was sleeping but arouses easily.  HENT:  Mouth/Throat: No oropharyngeal exudate.  Eyes:  She has some conjunctival redness bilaterally.   Neck: Neck supple.  Cardiovascular: Normal rate and regular rhythm.   No murmur heard. Pulmonary/Chest: Effort normal and breath sounds normal. She has no wheezes. She has no rales.  Abdominal: Soft. There is no tenderness.  Left upper quadrant pump site looks good with healing incision and no evidence of infection.  Musculoskeletal: Normal range of motion. She exhibits tenderness. She exhibits no edema.  She has diffuse myalgias and arthralgias.  Neurological: She is alert and oriented to person, place, and time.  Skin: Rash noted. There is erythema.  I do not appreciate any change in her diffuse erythematous, maculopapular rash over the past 24 hours.  Psychiatric: Mood and affect normal.    Lab Results Lab Results  Component Value Date   WBC 11.2 (H) 07/11/2016   HGB 10.8 (L) 07/11/2016   HCT 33.1 (L) 07/11/2016   MCV 90.4 07/11/2016   PLT 295 07/11/2016    Lab Results  Component Value Date   CREATININE 0.86 07/11/2016   BUN 7 07/11/2016   NA 140 07/11/2016   K 3.5 07/11/2016   CL 110 07/11/2016   CO2 24 07/11/2016    Lab Results  Component Value Date   ALT 54 07/10/2016   AST 70 (H) 07/10/2016   ALKPHOS 140 (H) 07/10/2016   BILITOT 0.3 07/10/2016    Sed Rate (mm/hr)  Date Value  07/09/2016 30 (H)   CRP (mg/dL)  Date Value  81/19/1478 8.8 (H)   Serum cryptococcal antigen: Negative RPR: Nonreactive Myeloperoxidase: Negative EBV serology: Remote, and inactive infection CMV IgM: Negative  Microbiology: Recent Results (from the past 240 hour(s))  Urine culture     Status: Abnormal   Collection Time: 07/04/16  2:00 PM  Result Value Ref Range Status   Specimen Description URINE, CLEAN CATCH  Final   Special Requests NONE  Final   Culture 60,000 COLONIES/mL ENTEROCOCCUS FAECALIS (A)  Final   Report Status 07/06/2016 FINAL  Final   Organism ID, Bacteria ENTEROCOCCUS FAECALIS (A)  Final      Susceptibility   Enterococcus faecalis - MIC*    AMPICILLIN <=2  SENSITIVE Sensitive     LEVOFLOXACIN 0.5 SENSITIVE Sensitive     NITROFURANTOIN <=16 SENSITIVE Sensitive     VANCOMYCIN 1 SENSITIVE Sensitive     * 60,000 COLONIES/mL ENTEROCOCCUS FAECALIS  Blood culture (routine x 2)     Status: None   Collection Time: 07/04/16  2:39 PM  Result Value Ref Range Status   Specimen Description BLOOD LEFT HAND  Final   Special Requests BOTTLES DRAWN AEROBIC ONLY 5 CC  Final   Culture   Final    NO GROWTH  5 DAYS Performed at [facility]    Report Status 07/09/2016 FINAL  Final  Blood culture (routine x 2)     Status: None   Collection Time: 07/04/16  2:54 PM  Result Value Ref Range Status   Specimen Description BLOOD RIGHT ANTECUBITAL  Final   Special Requests BOTTLES DRAWN AEROBIC AND ANAEROBIC 5 CC EA  Final   Culture   Final    NO GROWTH 5 DAYS Performed at [facility]    Report Status 07/09/2016 FINAL  Final  MRSA PCR Screening     Status: None   Collection Time: 07/06/16 10:07 PM  Result Value Ref Range Status   MRSA by PCR NEGATIVE NEGATIVE Final    Comment:        The GeneXpert MRSA Assay (FDA approved for NASAL specimens only), is one component of a comprehensive MRSA colonization surveillance program. It is not intended to diagnose MRSA infection nor to guide or monitor treatment for MRSA infections.   Culture, blood (routine x 2)     Status: None   Collection Time: 07/07/16  7:50 AM  Result Value Ref Range Status   Specimen Description BLOOD RIGHT HAND  Final   Special Requests BOTTLES DRAWN AEROBIC ONLY 5CC  Final   Culture   Final    NO GROWTH 5 DAYS Performed at [facility]    Report Status 07/12/2016 FINAL  Final  Culture, blood (routine x 2)     Status: None   Collection Time: 07/07/16  7:50 AM  Result Value Ref Range Status   Specimen Description BLOOD RIGHT ARM  Final   Special Requests BOTTLES DRAWN AEROBIC ONLY 5CC  Final   Culture   Final    NO GROWTH 5 DAYS Performed at [facility]    Report Status 07/12/2016 FINAL  Final  Culture, blood (routine x 2)     Status: None (Preliminary result)   Collection Time: 07/09/16  2:18 AM  Result Value Ref Range Status   Specimen Description BLOOD LEFT ARM  Final   Special Requests IN PEDIATRIC BOTTLE 5CC  Final   Culture   Final    NO GROWTH 4 DAYS Performed at [facility]    Report Status PENDING  Incomplete  Culture, blood (routine x 2)     Status: None (Preliminary result)   Collection Time: 07/09/16  2:36 AM  Result Value Ref Range Status   Specimen Description BLOOD RIGHT ANTECUBITAL  Final   Special Requests BOTTLES DRAWN AEROBIC ONLY  Final   Culture   Final    NO GROWTH 4 DAYS Performed at [facility]    Report Status PENDING  Incomplete     ASSESSMENT: I still do not have any obvious explanation for her fever and rash. I discussed the case with her rheumatologist, Dr. Willette Pa, at [facility]. Although the rash is not typical of Wegner's, Dr. Freida Busman did agree with empirically increasing her prednisone. She suggested 30 mg daily. I discussed this with Ms. Muellner and she is in agreement.  PLAN: 1. Observe off of antibiotics 2. Increase prednisone to 30 mg daily  Cliffton Asters, MD [facility] [phone] pager   [phone] cell

## 2016-07-14 DIAGNOSIS — N183 Chronic kidney disease, stage 3 (moderate): Secondary | ICD-10-CM

## 2016-07-14 DIAGNOSIS — E2740 Unspecified adrenocortical insufficiency: Secondary | ICD-10-CM

## 2016-07-14 LAB — CREATININE, SERUM: CREATININE: 0.79 mg/dL (ref 0.44–1.00)

## 2016-07-14 LAB — CULTURE, BLOOD (ROUTINE X 2)
CULTURE: NO GROWTH
Culture: NO GROWTH

## 2016-07-14 LAB — GLUCOSE, CAPILLARY: Glucose-Capillary: 157 mg/dL — ABNORMAL HIGH (ref 65–99)

## 2016-07-14 MED ORDER — PREDNISONE 5 MG PO TABS: 30.0000 mg | ORAL_TABLET | Freq: Every day | ORAL | Status: DC

## 2016-07-14 NOTE — Progress Notes (Signed)
Called [facility] to arrange [facility] Rm [phone]. [facility] stated that she is 4th on the list.

## 2016-07-14 NOTE — Progress Notes (Signed)
[facility]    Date of Admission:  07/04/2016     Principal Problem:   Fever Active Problems:   Hypotension   Rash   Wegner's disease (congenital syphilitic osteochondritis)   Adrenal insufficiency (HCC)   OSA (obstructive sleep apnea)   Peripheral neuropathy (HCC)   CKD (chronic kidney disease) stage 3, GFR 30-59 ml/min   Fever of unknown origin   . B-complex with vitamin C  1 tablet Oral Daily  . DULoxetine  60 mg Oral BID  . enoxaparin (LOVENOX) injection  40 mg Subcutaneous Q24H  . fludrocortisone  0.1 mg Oral Daily  . folic acid  1 mg Oral Daily  . gabapentin  1,200 mg Oral Daily  . gabapentin  1,600 mg Oral QHS  . lacosamide  100 mg Oral BID  . lidocaine  1 patch Transdermal Q24H  . pantoprazole  40 mg Oral Daily  . predniSONE  30 mg Oral Q breakfast  . topiramate  50 mg Oral QHS  . traZODone  50 mg Oral QHS  . vitamin B-12  1,000 mcg Oral Daily  . Vitamin D (Ergocalciferol)  50,000 Units Oral Q7 days    SUBJECTIVE: She is feeling better today. She has not had any fever in over 24 hours. She believes her rash may be starting to get a little bit better. The aching pain in her hands and right knee have improved. She states "I can now make a fist".  Review of Systems: Review of Systems  Constitutional: Positive for diaphoresis and malaise/fatigue. Negative for chills, fever and weight loss.  HENT: Negative for sore throat.   Respiratory: Positive for cough. Negative for sputum production and shortness of breath.   Cardiovascular: Negative for chest pain.  Gastrointestinal: Negative for abdominal pain, diarrhea, nausea and vomiting.  Genitourinary: Negative for dysuria.  Musculoskeletal: Positive for joint pain and myalgias.  Skin: Positive for rash.  Neurological: Negative for headaches.    Past Medical History:  Diagnosis Date  . Chronic cough   . Diabetes mellitus without complication (HCC)   . Hypertension   . OSA  (obstructive sleep apnea)   . Peripheral neuropathy (HCC)   . Wegener's granulomatosis (HCC) 2009  . Wegner's disease (congenital syphilitic osteochondritis)     Social History  Substance Use Topics  . Smoking status: Never Smoker  . Smokeless tobacco: Never Used  . Alcohol use Yes     Comment: occasionally    Family History  Problem Relation Age of Onset  . Diabetes Mother   . Heart disease Mother   . Diabetes Father    Allergies  Allergen Reactions  . Motrin [Ibuprofen] Other (See Comments)    Pt is unable to take this due to kidney problems.    . Penicillins Rash and Other (See Comments)    Has patient had a PCN reaction causing immediate rash, facial/tongue/throat swelling, SOB or lightheadedness with hypotension: No Has patient had a PCN reaction causing severe rash involving mucus membranes or skin necrosis: No Has patient had a PCN reaction that required hospitalization No Has patient had a PCN reaction occurring within the last 10 years: No If all of the above answers are "NO", then may proceed with Cephalosporin use.    OBJECTIVE: Vitals:   07/13/16 1841 07/13/16 2145 07/14/16 0458 07/14/16 1347  BP: 101/63 115/66 107/68 (!) 96/50  Pulse: 77 72 64 74  Resp: 20 18 18 18   Temp: 98.7  F (37.1 C) 98.3 F (36.8 C) 97.6 F (36.4 C) 97.8 F (36.6 C)  TempSrc: Oral Oral Oral Oral  SpO2: 95% 94% 96% 96%  Weight:   231 lb 0.7 oz (104.8 kg)   Height:   5\' 6"  (1.676 m)    Body mass index is 37.29 kg/m.  Physical Exam  Constitutional: She is oriented to person, place, and time.  She looks much better today. She is sitting up in bed coming her hair. She is smiling and in good spirits.  HENT:  Mouth/Throat: No oropharyngeal exudate.  Cardiovascular: Normal rate and regular rhythm.   No murmur heard. Pulmonary/Chest: Effort normal and breath sounds normal.  Musculoskeletal: Normal range of motion. She exhibits no edema or tenderness.  Neurological: She is alert  and oriented to person, place, and time.  Skin: Rash noted.  Her rash looks a little bit better today. I do not see any new lesions.  Psychiatric: Mood and affect normal.    Lab Results Lab Results  Component Value Date   WBC 11.2 (H) 07/11/2016   HGB 10.8 (L) 07/11/2016   HCT 33.1 (L) 07/11/2016   MCV 90.4 07/11/2016   PLT 295 07/11/2016    Lab Results  Component Value Date   CREATININE 0.79 07/14/2016   BUN 7 07/11/2016   NA 140 07/11/2016   K 3.5 07/11/2016   CL 110 07/11/2016   CO2 24 07/11/2016    Lab Results  Component Value Date   ALT 54 07/10/2016   AST 70 (H) 07/10/2016   ALKPHOS 140 (H) 07/10/2016   BILITOT 0.3 07/10/2016    cANCA: <1:20 pANCA: 1:20  Microbiology: Recent Results (from the past 240 hour(s))  MRSA PCR Screening     Status: None   Collection Time: 07/06/16 10:07 PM  Result Value Ref Range Status   MRSA by PCR NEGATIVE NEGATIVE Final    Comment:        The GeneXpert MRSA Assay (FDA approved for NASAL specimens only), is one component of a comprehensive MRSA colonization surveillance program. It is not intended to diagnose MRSA infection nor to guide or monitor treatment for MRSA infections.   Culture, blood (routine x 2)     Status: None   Collection Time: 07/07/16  7:50 AM  Result Value Ref Range Status   Specimen Description BLOOD RIGHT HAND  Final   Special Requests BOTTLES DRAWN AEROBIC ONLY 5CC  Final   Culture   Final    NO GROWTH 5 DAYS Performed at [facility]    Report Status 07/12/2016 FINAL  Final  Culture, blood (routine x 2)     Status: None   Collection Time: 07/07/16  7:50 AM  Result Value Ref Range Status   Specimen Description BLOOD RIGHT ARM  Final   Special Requests BOTTLES DRAWN AEROBIC ONLY 5CC  Final   Culture   Final    NO GROWTH 5 DAYS Performed at [facility]    Report Status 07/12/2016 FINAL  Final  Culture, blood (routine x 2)     Status: None   Collection Time: 07/09/16  2:18 AM    Result Value Ref Range Status   Specimen Description BLOOD LEFT ARM  Final   Special Requests IN PEDIATRIC BOTTLE 5CC  Final   Culture   Final    NO GROWTH 5 DAYS Performed at [facility]    Report Status 07/14/2016 FINAL  Final  Culture, blood (routine x 2)  Status: None   Collection Time: 07/09/16  2:36 AM  Result Value Ref Range Status   Specimen Description BLOOD RIGHT ANTECUBITAL  Final   Special Requests BOTTLES DRAWN AEROBIC ONLY 6ML  Final   Culture   Final    NO GROWTH 5 DAYS [facility]    Report Status 07/14/2016 FINAL  Final     ASSESSMENT: She is improving for the first time in 2 weeks after an increase in her prednisone. Her pANCA is low level positive at 1:20. While I am not absolutely certain what has caused her recent fever and rash I suspect it is in some way related to her Wegener's vasculitis.  PLAN: 1. Continue prednisone 30 mg daily 2. [facility] 3. Please call if we can be of further assistance  Cliffton AstersJohn Abbigaile Rockman, MD [facility] [phone] pager   [phone] cell 07/14/2016, 4:39 PMPatient ID: Stacey CromerLisa [name], [name]   DOB: 05/28/1966, 50 y.o.   MRN: [id number]

## 2016-07-14 NOTE — Progress Notes (Signed)
Gave report to Lissa HoardSonia, RN at the [facility]. Left number if she had additional questions. Ninfa Lindenyan, May Manrique N, RN 6:21 PM

## 2016-07-14 NOTE — Discharge Summary (Signed)
Physician Discharge Summary  [name]:[id number] DOB: 05/10/1966 DOA: 07/04/2016  PCP: No PCP Per Patient  Admit date: 07/04/2016 Discharge date: 07/14/2016   Recommendations for Outpatient Follow-Up:   1. To [facility]   Discharge Diagnosis:   Principal Problem:   Fever Active Problems:   Wegner's disease (congenital syphilitic osteochondritis)   Adrenal insufficiency (HCC)   OSA (obstructive sleep apnea)   Peripheral neuropathy (HCC)   Hypotension   Rash   CKD (chronic kidney disease) stage 3, GFR 30-59 ml/min   Fever of unknown origin   Discharge disposition:  [facility]  Discharge Condition: stable  Diet recommendation:   Regular.  Wound care: None.   History of Present Illness:   [name] is a 50 y.o. 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  She has a chronic, painful peripheral neuropathy related to prior therapy for Wegener's. She recently had a new pain pump placed in her abdomen.Several weeks ago she developed bronchitis and was started on Omnicef by her family practitioner on 06/21/2016. She completed antibiotic therapy  Last week. Three days ago she began to notice a somewhat painful red rash that has spread and generalized.  One of the larger lesions on her left hip "busted" and drained some pus.  She has been having fever at home to as high as 101.5. She has been dizzy on standing. She had one episode of nausea and vomiting this morning.  She saw her family practitioner again this morning. She was hypotensive and was advised to come to emergency department for further evaluation.  Hospital Course by Problem:   Fever / Generalized maculopapular rash / Hypotension - Patient presented with fever, generalized painful rash and hypotension.  - Patient has history of adrenal insufficiency and is on Florinef as well as low dose prednisone, which will continue: steroids discussed with Dr. Orvan Falconer-- will hold for now until seen by  specialist - Presentation with fever and generalized rash was worrisome for acute infectious process, but no infection has been found to date - blood culture results: No growth to date, repeat blood cultures done 9/14: NGTD - general surgery for skin biopsy and I&D small abscess done 9/15.   Skin Biopsy Results: Diagnosis 1. Skin , left distal thigh: BENIGN SQUAMOUS EPITHELIUM AND SUBMUCOSA WITH SCAR 2. 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  Adrenal insufficiency (HCC) - Continue Florinef, prednisone increased to 30mg  PO daily by ID after talking with patient's rheumatologist  Fever Unknown Cause - Blood cultures repeated 9/14 No growth to date - Tylenol being given around the clock  Enterococcus UTI  Pt was treated with vancomycin x 3 doses  Hypokalemia - replacing orally, need to recheck in AM, add magnesium as well  Chronic Pain - Pt has implanted pain pump for intrathecal prialt administration (placed Aug 2017) by Dr. Elberta Leatherwood ([facility]) -attending spoke with  Dr Jeanice LimQuadri 9/14.   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 Knee feels better  today after steroids increased.  OSA (obstructive sleep apnea) - Stable respiratory status   Peripheral neuropathy (HCC) - Continue gabapentin  Essential hypertension - Blood pressure medications on hold for now, BPs have been well controlled and stable.  She was hypotensive on admission but that has resolved.   CKD (chronic kidney disease) stage 3, GFR 30-59 ml/min - Baseline creatinine about 3 years ago is 1.14 - Creatinine on this admission is 1.3.  Holding home lasix for now as this may have contributed to worsening renal function    Medical Consultants:    General surgery  ID   Discharge Exam:   Vitals:   07/14/16 0458 07/14/16 1347  BP: 107/68 (!) 96/50  Pulse: 64 74  Resp: 18 18  Temp: 97.6 F (36.4 C) 97.8 F (36.6 C)   Vitals:   07/13/16 1841 07/13/16 2145 07/14/16 0458 07/14/16 1347  BP: 101/63 115/66 107/68 (!) 96/50  Pulse: 77 72 64 74  Resp: 20 18 18 18   Temp: 98.7 F (37.1 C) 98.3 F (36.8 C) 97.6 F (36.4 C) 97.8 F (36.6 C)  TempSrc: Oral Oral Oral Oral  SpO2: 95% 94% 96% 96%  Weight:   104.8 kg (231 lb 0.7 oz)   Height:   5\' 6"  (1.676 m)     Gen:  NAD Redness on left eye Lungs clear    The results of significant diagnostics from this hospitalization (including imaging, microbiology, ancillary and laboratory) are listed below for reference.     Procedures and Diagnostic Studies:   Dg Chest 2 View  Result Date: 07/04/2016 CLINICAL DATA:  Cough.  Fevers.  Rash. EXAM: CHEST  2 VIEW COMPARISON:  06/07/2016 chest radiograph. FINDINGS: Stable cardiomediastinal silhouette with normal heart size. No pneumothorax. No pleural effusion. Lungs appear clear, with no acute consolidative airspace disease and no pulmonary edema. Minimal thoracic spondylosis. Cholecystectomy clips are seen in the right upper quadrant of the abdomen. IMPRESSION: No active cardiopulmonary disease. Electronically Signed   By: Delbert PhenixJason A Poff M.D.   On: 07/04/2016 14:44       Labs:   Basic Metabolic Panel:  Recent Labs Lab 07/08/16 0534 07/09/16 1049 07/10/16 0506 07/11/16 0537 07/14/16 0520  NA 142 136 139 140  --   K 3.5 3.1* 3.0* 3.5  --   CL 115* 106 109 110  --   CO2 21* 23 24 24   --   GLUCOSE 136* 110* 119* 101*  --   BUN 8 8 9 7   --   CREATININE 0.81 1.05* 0.88 0.86 0.79  CALCIUM 8.7* 8.1* 8.0* 8.2*  --   MG  --   --  1.8  --   --    GFR Estimated Creatinine Clearance: 102.9 mL/min (by C-G formula based on SCr of 0.79 mg/dL). Liver Function Tests:  Recent Labs Lab 07/08/16 0534 07/10/16 0506  AST 52* 70*  ALT 47 54  ALKPHOS 136* 140*  BILITOT 0.4 0.3  PROT 6.0* 5.4*  ALBUMIN 2.8* 2.5*   No results for input(s): LIPASE, AMYLASE in the last 168 hours. No results for input(s): AMMONIA in the last 168 hours. Coagulation profile No results for input(s): INR, PROTIME in the last 168 hours.  CBC:  Recent Labs Lab 07/08/16 0534 07/09/16 1514 07/10/16 0506 07/11/16 0537  WBC 7.6 14.1* 10.4 11.2*  NEUTROABS 4.0 5.5 4.9 4.7  HGB 11.4* 11.3* 10.5* 10.8*  HCT 34.1* 35.4* 31.8* 33.1*  MCV 88.6 92.2 90.1 90.4  PLT 244 270 256 295   Cardiac Enzymes: No results for input(s): CKTOTAL, CKMB, CKMBINDEX, TROPONINI in the last 168 hours. BNP: Invalid input(s): POCBNP CBG:  Recent Labs Lab 07/10/16 0738 07/11/16 0746 07/12/16 0722 07/13/16 0802 07/14/16 0722  GLUCAP 87 94 101* 118* 157*   D-Dimer No results for input(s): DDIMER in the last 72 hours. Hgb A1c No results for input(s): HGBA1C in the last 72 hours. Lipid Profile No results for input(s): CHOL, HDL, LDLCALC, TRIG, CHOLHDL, LDLDIRECT in the last 72 hours. Thyroid function studies No results for input(s): TSH, T4TOTAL, T3FREE, THYROIDAB in the last 72 hours.  Invalid input(s): FREET3 Anemia work up No results for input(s): VITAMINB12, FOLATE, FERRITIN, TIBC, IRON, RETICCTPCT in the last 72 hours. Microbiology Recent Results (from the past 240 hour(s))   MRSA PCR Screening     Status: None   Collection Time: 07/06/16 10:07 PM  Result Value Ref Range Status   MRSA by PCR NEGATIVE NEGATIVE Final    Comment:        The GeneXpert MRSA Assay (FDA approved for NASAL specimens only), is one component of a comprehensive MRSA colonization surveillance program. It is not intended to diagnose MRSA infection nor to guide or monitor treatment for MRSA infections.   Culture, blood (routine x 2)     Status: None   Collection Time: 07/07/16  7:50 AM  Result Value Ref Range Status   Specimen Description BLOOD RIGHT HAND  Final   Special Requests BOTTLES DRAWN AEROBIC ONLY 5CC  Final   Culture   Final    NO GROWTH 5 DAYS Performed at [facility]    Report Status 07/12/2016 FINAL  Final  Culture, blood (routine x 2)     Status: None   Collection Time: 07/07/16  7:50 AM  Result Value Ref Range Status   Specimen Description BLOOD RIGHT ARM  Final   Special Requests BOTTLES DRAWN AEROBIC ONLY 5CC  Final   Culture   Final    NO GROWTH 5 DAYS Performed at [facility]    Report Status 07/12/2016 FINAL  Final  Culture, blood (routine x 2)     Status: None (Preliminary result)   Collection Time: 07/09/16  2:18 AM  Result Value Ref Range Status   Specimen Description BLOOD LEFT ARM  Final   Special Requests IN PEDIATRIC BOTTLE 5CC  Final   Culture   Final    NO GROWTH 4 DAYS Performed at [facility]    Report Status PENDING  Incomplete  Culture, blood (routine x 2)     Status: None (Preliminary result)   Collection Time: 07/09/16  2:36 AM  Result Value Ref Range Status   Specimen Description BLOOD RIGHT ANTECUBITAL  Final   Special Requests BOTTLES DRAWN AEROBIC ONLY [id number] .[id number] .[id number] .[id number] .[id number] .[id number] .[id number] .[id number] .[id number] .[id number] .[id number] .[id number] .[id number] .[id number] .[id number] .[id number] .[id number] .[id number] .[id number]   Culture   Final    NO GROWTH 4 DAYS Performed at [facility]    Report Status PENDING  Incomplete     Discharge Instructions:   Discharge Instructions    Diet - low sodium heart healthy    Complete by:  As directed     Discharge instructions    Complete by:  As directed    Transfer to [facility] for  specialist care   Increase activity slowly    Complete by:  As directed        Medication List    STOP taking these medications   esomeprazole 40 MG capsule Commonly known as:  NEXIUM   furosemide 40 MG tablet Commonly known as:  LASIX   hydrochlorothiazide 25 MG tablet Commonly known as:  HYDRODIURIL   losartan 25 MG tablet Commonly known as:  COZAAR   methotrexate 250 MG/10ML injection   nystatin powder Generic drug:  nystatin   nystatin-triamcinolone cream Commonly known as:  MYCOLOG II   potassium chloride SA 20 MEQ tablet Commonly known as:  K-DUR,KLOR-CON   sulfamethoxazole-trimethoprim 800-160 MG per tablet Commonly known as:  BACTRIM DS   traMADol 50 MG tablet Commonly known as:  ULTRAM     TAKE these medications   acetaminophen 325 MG tablet Commonly known as:  TYLENOL Take 2 tablets (650 mg total) by mouth every 6 (six) hours as needed for mild pain (or Fever >/= 101).   albuterol 108 (90 Base) MCG/ACT inhaler Commonly known as:  PROVENTIL HFA;VENTOLIN HFA Inhale 2 puffs into the lungs every 6 (six) hours as needed for wheezing or shortness of breath.   b complex vitamins tablet Take 1 tablet by mouth daily.   dextromethorphan-guaiFENesin 30-600 MG 12hr tablet Commonly known as:  MUCINEX DM Take 1 tablet by mouth 2 (two) times daily as needed for cough.   DULoxetine 60 MG capsule Commonly known as:  CYMBALTA Take 60 mg by mouth 2 (two) times daily.   fludrocortisone 0.1 MG tablet Commonly known as:  FLORINEF Take 0.1 mg by mouth daily.   fluticasone 50 MCG/ACT nasal spray Commonly known as:  FLONASE Place 2 sprays into both nostrils daily as needed for rhinitis.   folic acid 1 MG tablet Commonly known as:  FOLVITE Take 1 mg by mouth daily.   gabapentin 300 MG capsule Commonly known as:  NEURONTIN Take 1,200-1,600 mg by mouth 2 (two) times daily. Pt takes  three capsules in the morning and four at night.   HYDROcodone-acetaminophen 5-325 MG tablet Commonly known as:  NORCO/VICODIN Take 1-2 tablets by mouth every 4 (four) hours as needed for moderate pain or severe pain.   lidocaine 5 % Commonly known as:  LIDODERM Place 1 patch onto the skin daily as needed (for pain). Remove & Discard patch within 12 hours or as directed by MD   oxyCODONE 5 MG immediate release tablet Commonly known as:  Oxy IR/ROXICODONE Take 1-2 tablets (5-10 mg total) by mouth every 4 (four) hours as needed for moderate pain.   pantoprazole 40 MG tablet Commonly known as:  PROTONIX Take 40 mg by mouth daily.   predniSONE 5 MG tablet Commonly known as:  DELTASONE Take 6 tablets (30 mg total) by mouth daily. What changed:  how much to take   topiramate 50 MG tablet Commonly known as:  TOPAMAX Take 50 mg by mouth at bedtime.   traZODone 50 MG tablet Commonly known as:  DESYREL Take 50 mg by mouth at bedtime.   VIMPAT 100 MG Tabs Generic drug:  Lacosamide Take 100 mg by mouth 2 (two) times daily.   vitamin B-12 1000 MCG tablet Commonly known as:  CYANOCOBALAMIN Take 1,000 mcg by mouth daily.   Vitamin D (Ergocalciferol) 50000 units Caps capsule Commonly known as:  DRISDOL Take 50,000 Units by mouth every 7 (seven) days. Pt takes on Friday.      Follow-up Information  Ernestene Mention, MD Follow up on 07/17/2016.   Specialty:  General Surgery Why:  for suture removal  Contact information: [address] [place] [address] [phone]            Time coordinating discharge: 35 min  Signed:  Breahna Boylen Vergie Living MD  [facility] 07/14/2016, 4:20 PM

## 2016-07-14 NOTE — Progress Notes (Signed)
07/14/2016 1:53 PM  PROGRESS NOTE   Stacey CromerLisa Neyens  WUJ:[id number]  DOB: February 02, 1966  DOA: 07/04/2016 PCP: No PCP Per Patient  Hospital course: Stacey CromerLisa Peregrina is a 50 y.o. 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 She has been seen by primary care physician who referred her to emergency room for evaluation. Patient reported that rash started initially on lower extremities and then spread to her trunk. She reports no exposure to ticks. No reports of IV drug abuse. She did not know what the maximum temperature was but her fever has been intermittent and worse over past 24 hours. No reports of chest pain or shortness of breath. No cough.  No abdominal pain, nausea, vomiting or diarrhea.  Patient and husband request [facility] and we are now awaiting bed. In the mean time, Prednisone was increased to 30mg  PO daily and patient has slightly improving rash and reduced joint pains today.  Assessment & Plan:     Fever / Generalized maculopapular rash / Hypotension - Patient presented with fever, generalized painful rash and hypotension. She she has required IV fluids on the admission which has temporarily normalize her blood pressure to 108/73 but her current BP again 91/57.  Patient has history of adrenal insufficiency and is on Florinef as well as low dose prednisone, which will continue. - Presentation with fever and generalized rash was worrisome for acute infectious process, but no infection has been found to date - Seen by infectious disease received vancomycin x 3 days, discontinued on 9/14.  - Follow-up blood culture results: No growth to date, repeat blood cultures done 9/14: NGTD - Hypotension - resolved now. - general surgery for skin biopsy and I&D  small abscess done 9/15.    Skin Biopsy Results: Diagnosis 1. Skin , left distal thigh: BENIGN SQUAMOUS EPITHELIUM AND SUBMUCOSA WITH SCAR 2. 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    Adrenal insufficiency (HCC) - Continue Florinef, prednisone  Fever Unknown Cause - Blood cultures repeated 9/14 No growth to date - Tylenol being given around the clock  Enterococcus UTI  Pt was treated with vancomycin x 3 doses  Hypokalemia - replaced, will check K and Mg with AM labs.  Chronic Pain - Pt has implanted pain pump for intrathecal prialt administration (placed Aug 2017) by Dr. Elberta LeatherwoodQadri ([facility]) -attending spoke with Dr Jeanice LimQuadri 9/14.   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  She was hypotensive on admission but that has resolved.   CKD (chronic kidney disease) stage 3,  GFR 30-59 ml/min - Baseline creatinine about 3 years ago is 1.14 - Creatinine on this admission is 1.3.  Holding home lasix for now as this may have contributed to worsening renal function   DVT prophylaxis: lovenox  Code Status: full code  Family Communication: husband at the bedside   Disposition Plan: await bed availability at [facility]  Antimicrobials: Anti-infectives    Start     Dose/Rate Route Frequency Ordered Stop   07/05/16 1600  vancomycin (VANCOCIN) 1,250 mg in sodium chloride 0.9 % 250 mL IVPB  Status:  Discontinued     1,250 mg 166.7 mL/hr over 90 Minutes Intravenous Every 24 hours 07/04/16 1627 07/05/16 0828   07/05/16 0900  vancomycin (VANCOCIN) IVPB 750 mg/150 ml premix  Status:  Discontinued     750 mg 150 mL/hr over 60 Minutes Intravenous Every 12 hours 07/05/16 0828 07/06/16 1627   07/04/16 1630  vancomycin (VANCOCIN) 1,500 mg in sodium chloride 0.9 % 500 mL IVPB     1,500 mg 250 mL/hr over 120 Minutes Intravenous  Once 07/04/16 1618 07/04/16 1903     Subjective: Feeling a little better today, she is pleasant and happy. Knee pain is better, rash is less bothersome.  Objective: Vitals:   07/13/16 1841 07/13/16 2145 07/14/16 0458 07/14/16 1347  BP: 101/63 115/66 107/68 (!) 96/50  Pulse: 77 72 64 74  Resp: 20 18 18 18   Temp: 98.7 F (37.1 C) 98.3 F (36.8 C) 97.6 F (36.4 C) 97.8 F (36.6 C)  TempSrc: Oral Oral Oral Oral  SpO2: 95% 94% 96% 96%  Weight:   104.8 kg (231 lb 0.7 oz)   Height:   5\' 6"  (1.676 m)     Intake/Output Summary (Last 24 hours) at 07/14/16 1353 Last data filed at 07/13/16 1800  Gross per 24 hour  Intake              360 ml  Output                0 ml  Net              360 ml   Filed Weights   07/04/16 1838 07/11/16 0549 07/14/16 0458  Weight: 106.5 kg (234 lb 12.6 oz) 110.9 kg (244 lb 7.8 oz) 104.8 kg (231 lb 0.7 oz)   Exam:  Respiratory: clear to auscultation bilaterally, no wheezing, no crackles. Normal respiratory effort.  No accessory muscle use.  Cardiovascular: Regular rate and rhythm, no murmurs / rubs / gallops. No extremity edema. 2+ pedal pulses. No carotid bruits.  Abdomen: no tenderness, no masses palpated. No hepatosplenomegaly. Bowel sounds positive.  Musculoskeletal: no clubbing / cyanosis. No joint deformity upper and lower extremities. Good ROM, no contractures. Normal muscle tone.  Skin: sporadic macular rash over trunk, lower extremities. Skin is warm and dry.   Data Reviewed: Basic Metabolic Panel:  Recent Labs Lab 07/08/16 0534 07/09/16 1049 07/10/16 0506 07/11/16 0537 07/14/16 0520  NA 142 136 139 140  --   K 3.5 3.1* 3.0* 3.5  --   CL 115* 106 109 110  --   CO2 21* 23 24 24   --   GLUCOSE 136* 110* 119* 101*  --   BUN 8 8 9  7  --   CREATININE 0.81 1.05* 0.88 0.86 0.79  CALCIUM 8.7* 8.1* 8.0* 8.2*  --   MG  --   --  1.8  --   --    Liver Function Tests:  Recent Labs Lab 07/08/16 0534 07/10/16 0506  AST 52* 70*  ALT 47 54  ALKPHOS 136* 140*  BILITOT 0.4 0.3  PROT 6.0* 5.4*  ALBUMIN 2.8* 2.5*   No results for input(s): LIPASE, AMYLASE in the last 168 hours. No results for input(s): AMMONIA in the last 168 hours. CBC:  Recent Labs Lab 07/08/16 0534 07/09/16 1514 07/10/16 0506 07/11/16 0537  WBC 7.6 14.1* 10.4 11.2*  NEUTROABS 4.0 5.5 4.9 4.7  HGB 11.4* 11.3* 10.5* 10.8*  HCT 34.1* 35.4* 31.8* 33.1*  MCV 88.6 92.2 90.1 90.4  PLT 244 270 256 295   Cardiac Enzymes: No results for input(s): CKTOTAL, CKMB, CKMBINDEX, TROPONINI in the last 168 hours. CBG (last 3)   Recent Labs  07/12/16 0722 07/13/16 0802 07/14/16 0722  GLUCAP 101* 118* 157*   Recent Results (from the past 240 hour(s))  Urine culture     Status: Abnormal   Collection Time: 07/04/16  2:00 PM  Result Value Ref Range Status   Specimen Description URINE, CLEAN CATCH  Final   Special Requests NONE  Final   Culture 60,000 COLONIES/mL ENTEROCOCCUS FAECALIS (A)  Final   Report Status  07/06/2016 FINAL  Final   Organism ID, Bacteria ENTEROCOCCUS FAECALIS (A)  Final      Susceptibility   Enterococcus faecalis - MIC*    AMPICILLIN <=2 SENSITIVE Sensitive     LEVOFLOXACIN 0.5 SENSITIVE Sensitive     NITROFURANTOIN <=16 SENSITIVE Sensitive     VANCOMYCIN 1 SENSITIVE Sensitive     * 60,000 COLONIES/mL ENTEROCOCCUS FAECALIS  Blood culture (routine x 2)     Status: None   Collection Time: 07/04/16  2:39 PM  Result Value Ref Range Status   Specimen Description BLOOD LEFT HAND  Final   Special Requests BOTTLES DRAWN AEROBIC ONLY 5 CC  Final   Culture   Final    NO GROWTH 5 DAYS Performed at [facility]    Report Status 07/09/2016 FINAL  Final  Blood culture (routine x 2)     Status: None   Collection Time: 07/04/16  2:54 PM  Result Value Ref Range Status   Specimen Description BLOOD RIGHT ANTECUBITAL  Final   Special Requests BOTTLES DRAWN AEROBIC AND ANAEROBIC 5 CC EA  Final   Culture   Final    NO GROWTH 5 DAYS Performed at [facility]    Report Status 07/09/2016 FINAL  Final  MRSA PCR Screening     Status: None   Collection Time: 07/06/16 10:07 PM  Result Value Ref Range Status   MRSA by PCR NEGATIVE NEGATIVE Final    Comment:        The GeneXpert MRSA Assay (FDA approved for NASAL specimens only), is one component of a comprehensive MRSA colonization surveillance program. It is not intended to diagnose MRSA infection nor to guide or monitor treatment for MRSA infections.   Culture, blood (routine x 2)     Status: None   Collection Time: 07/07/16  7:50 AM  Result Value Ref Range Status   Specimen Description BLOOD RIGHT HAND  Final   Special Requests BOTTLES DRAWN AEROBIC ONLY 5CC  Final   Culture   Final    NO GROWTH 5 DAYS Performed at  [facility]    Report Status 07/12/2016 FINAL  Final  Culture, blood (routine x 2)     Status: None   Collection Time: 07/07/16  7:50 AM  Result Value Ref Range Status   Specimen  Description BLOOD RIGHT ARM  Final   Special Requests BOTTLES DRAWN AEROBIC ONLY 5CC  Final   Culture   Final    NO GROWTH 5 DAYS Performed at [facility]    Report Status 07/12/2016 FINAL  Final  Culture, blood (routine x 2)     Status: None (Preliminary result)   Collection Time: 07/09/16  2:18 AM  Result Value Ref Range Status   Specimen Description BLOOD LEFT ARM  Final   Special Requests IN PEDIATRIC BOTTLE 5CC  Final   Culture   Final    NO GROWTH 4 DAYS Performed at [facility]    Report Status PENDING  Incomplete  Culture, blood (routine x 2)     Status: None (Preliminary result)   Collection Time: 07/09/16  2:36 AM  Result Value Ref Range Status   Specimen Description BLOOD RIGHT ANTECUBITAL  Final   Special Requests BOTTLES DRAWN AEROBIC ONLY  Final   Culture   Final    NO GROWTH 4 DAYS Performed at [facility]    Report Status PENDING  Incomplete    Studies: No results found. Scheduled Meds: . B-complex with vitamin C  1 tablet Oral Daily  . DULoxetine  60 mg Oral BID  . enoxaparin (LOVENOX) injection  40 mg Subcutaneous Q24H  . fludrocortisone  0.1 mg Oral Daily  . folic acid  1 mg Oral Daily  . gabapentin  1,200 mg Oral Daily  . gabapentin  1,600 mg Oral QHS  . lacosamide  100 mg Oral BID  . lidocaine  1 patch Transdermal Q24H  . pantoprazole  40 mg Oral Daily  . predniSONE  30 mg Oral Q breakfast  . topiramate  50 mg Oral QHS  . traZODone  50 mg Oral QHS  . vitamin B-12  1,000 mcg Oral Daily  . Vitamin D (Ergocalciferol)  50,000 Units Oral Q7 days   Continuous Infusions:   Principal Problem:   Fever Active Problems:   Wegner's disease (congenital syphilitic osteochondritis)   Adrenal insufficiency (HCC)   OSA (obstructive sleep apnea)   Peripheral neuropathy (HCC)   Hypotension   Rash   CKD (chronic kidney disease) stage 3, GFR 30-59 ml/min   Fever of unknown origin  Time spent: 24 minutes  Mir Vergie Living, MD [facility] Pager [phone]  If 7PM-7AM, please contact night-coverage [email or web address] Password [email or web address] 07/14/2016, 1:53 PM    LOS: 9 days

## 2016-07-15 DIAGNOSIS — R21 Rash and other nonspecific skin eruption: Secondary | ICD-10-CM | POA: Insufficient documentation

## 2016-07-15 DIAGNOSIS — R51 Headache: Secondary | ICD-10-CM

## 2016-07-15 DIAGNOSIS — M2550 Pain in unspecified joint: Secondary | ICD-10-CM | POA: Insufficient documentation

## 2016-07-15 DIAGNOSIS — R519 Headache, unspecified: Secondary | ICD-10-CM | POA: Insufficient documentation

## 2016-10-26 DIAGNOSIS — Z7952 Long term (current) use of systemic steroids: Secondary | ICD-10-CM | POA: Insufficient documentation

## 2016-10-26 DIAGNOSIS — R7301 Impaired fasting glucose: Secondary | ICD-10-CM | POA: Insufficient documentation

## 2016-12-19 DIAGNOSIS — R9389 Abnormal findings on diagnostic imaging of other specified body structures: Secondary | ICD-10-CM | POA: Insufficient documentation

## 2016-12-19 DIAGNOSIS — R1310 Dysphagia, unspecified: Secondary | ICD-10-CM | POA: Insufficient documentation

## 2016-12-19 DIAGNOSIS — R1319 Other dysphagia: Secondary | ICD-10-CM | POA: Insufficient documentation

## 2017-01-12 ENCOUNTER — Emergency Department (HOSPITAL_COMMUNITY)
Admission: EM | Admit: 2017-01-12 | Discharge: 2017-01-12 | Disposition: A | Discharge status: Home / Self Care | Payer: BC Managed Care – PPO | Attending: Emergency Medicine | Admitting: Emergency Medicine

## 2017-01-12 ENCOUNTER — Emergency Department (HOSPITAL_COMMUNITY): Payer: BC Managed Care – PPO

## 2017-01-12 DIAGNOSIS — R52 Pain, unspecified: Secondary | ICD-10-CM

## 2017-01-12 DIAGNOSIS — R112 Nausea with vomiting, unspecified: Secondary | ICD-10-CM | POA: Diagnosis not present

## 2017-01-12 DIAGNOSIS — N183 Chronic kidney disease, stage 3 (moderate): Secondary | ICD-10-CM | POA: Diagnosis not present

## 2017-01-12 DIAGNOSIS — E1122 Type 2 diabetes mellitus with diabetic chronic kidney disease: Secondary | ICD-10-CM | POA: Insufficient documentation

## 2017-01-12 DIAGNOSIS — I129 Hypertensive chronic kidney disease with stage 1 through stage 4 chronic kidney disease, or unspecified chronic kidney disease: Secondary | ICD-10-CM | POA: Insufficient documentation

## 2017-01-12 DIAGNOSIS — R197 Diarrhea, unspecified: Secondary | ICD-10-CM | POA: Diagnosis not present

## 2017-01-12 DIAGNOSIS — R509 Fever, unspecified: Secondary | ICD-10-CM | POA: Diagnosis present

## 2017-01-12 DIAGNOSIS — M25571 Pain in right ankle and joints of right foot: Secondary | ICD-10-CM

## 2017-01-12 DIAGNOSIS — Z79899 Other long term (current) drug therapy: Secondary | ICD-10-CM | POA: Diagnosis not present

## 2017-01-12 DIAGNOSIS — J111 Influenza due to unidentified influenza virus with other respiratory manifestations: Secondary | ICD-10-CM | POA: Diagnosis not present

## 2017-01-12 DIAGNOSIS — R69 Illness, unspecified: Secondary | ICD-10-CM

## 2017-01-12 LAB — URINALYSIS, ROUTINE W REFLEX MICROSCOPIC
Bilirubin Urine: NEGATIVE
GLUCOSE, UA: NEGATIVE mg/dL
Hgb urine dipstick: NEGATIVE
KETONES UR: NEGATIVE mg/dL
LEUKOCYTES UA: NEGATIVE
NITRITE: NEGATIVE
PROTEIN: NEGATIVE mg/dL
Specific Gravity, Urine: 1.012 (ref 1.005–1.030)
pH: 5 (ref 5.0–8.0)

## 2017-01-12 LAB — CLOSTRIDIUM DIFFICILE BY PCR: Toxigenic C. Difficile by PCR: NEGATIVE

## 2017-01-12 LAB — COMPREHENSIVE METABOLIC PANEL
ALT: 32 U/L (ref 14–54)
ANION GAP: 15 (ref 5–15)
AST: 69 U/L — ABNORMAL HIGH (ref 15–41)
Albumin: 2.9 g/dL — ABNORMAL LOW (ref 3.5–5.0)
Alkaline Phosphatase: 86 U/L (ref 38–126)
BUN: 11 mg/dL (ref 6–20)
CHLORIDE: 99 mmol/L — AB (ref 101–111)
CO2: 20 mmol/L — ABNORMAL LOW (ref 22–32)
CREATININE: 2.09 mg/dL — AB (ref 0.44–1.00)
Calcium: 8.6 mg/dL — ABNORMAL LOW (ref 8.9–10.3)
GFR, EST AFRICAN AMERICAN: 30 mL/min — AB (ref >60)
GFR, EST NON AFRICAN AMERICAN: 26 mL/min — AB (ref >60)
Glucose, Bld: 202 mg/dL — ABNORMAL HIGH (ref 65–99)
POTASSIUM: 2.8 mmol/L — AB (ref 3.5–5.1)
SODIUM: 134 mmol/L — AB (ref 135–145)
Total Bilirubin: 0.9 mg/dL (ref 0.3–1.2)
Total Protein: 6.2 g/dL — ABNORMAL LOW (ref 6.5–8.1)

## 2017-01-12 LAB — CBG MONITORING, ED: GLUCOSE-CAPILLARY: 151 mg/dL — AB (ref 65–99)

## 2017-01-12 LAB — I-STAT TROPONIN, ED: TROPONIN I, POC: 0.02 ng/mL (ref 0.00–0.08)

## 2017-01-12 LAB — C DIFFICILE QUICK SCREEN W PCR REFLEX
C DIFFICILE (CDIFF) TOXIN: NEGATIVE
C Diff antigen: POSITIVE — AB

## 2017-01-12 LAB — POC URINE PREG, ED: PREG TEST UR: NEGATIVE

## 2017-01-12 LAB — LIPASE, BLOOD

## 2017-01-12 MED ORDER — SODIUM CHLORIDE 0.9 % IV BOLUS (SEPSIS)
1000.0000 mL | Freq: Once | INTRAVENOUS | Status: AC
  Administered 2017-01-12: 1000 mL via INTRAVENOUS

## 2017-01-12 MED ORDER — SODIUM CHLORIDE 0.9 % IV SOLN
30.0000 meq | Freq: Once | INTRAVENOUS | Status: AC
  Administered 2017-01-12: 30 meq via INTRAVENOUS
  Filled 2017-01-12: qty 15

## 2017-01-12 MED ORDER — ONDANSETRON HCL 4 MG/2ML IJ SOLN
4.0000 mg | Freq: Once | INTRAMUSCULAR | Status: AC
  Administered 2017-01-12: 4 mg via INTRAVENOUS
  Filled 2017-01-12: qty 2

## 2017-01-12 NOTE — Progress Notes (Signed)
Orthopedic Tech Progress Note Patient Details:  Stacey CromerLisa [name] 1966-03-12 [id number]  Ortho Devices Type of Ortho Device: ASO, Crutches Ortho Device/Splint Location: RLE Ortho Device/Splint Interventions: Ordered, Application   Jennye MoccasinHughes, Isatu Macinnes Craig 01/12/2017, 7:24 PM

## 2017-01-12 NOTE — ED Provider Notes (Signed)
[facility]-EMERGENCY DEPT Provider Note   CSN: [id number] Arrival date & time: 01/12/17  1055   History   Chief Complaint Chief Complaint  Patient presents with  . Fall  . GI Problem    HPI Zadaya Cuadra is a 51 y.o. female with history of non-insulin-dependent type 2 diabetes mellitus, hypertension, peripheral neuropathy (intrathecal pump, Prialt), chronic pain, Wegner's Granulomatosis is brought into the ED by EMS after two syncopal episodes this morning. Patient reports fevers, nausea, abdominal pain, sore throat, increased nasal discharge, worsening wet sounding cough  3 days, started having nausea, vomiting and diarrhea last night after syncopal episodes.Patient states her diarrhea is uncontrollable, has been defecating on herself. No known sick contacts with flu. No recent exposure to suspicious foods. Last hospitalization August 2017.  Patient takes bactrim three times a week for Wegner's.  Patient states that she went to get water in the middle of the night and doesn't know what happened but remembers trying to stand up off the floor.  She noticed then her right ankle was hurting, she attributes her fall to rolling her ankle.  Patient's husband then tried to help her get up and patient "passed out" again in his arms, husband states this lasted a few seconds and it seemed like her knees buckled, but she came to in a few seconds.  No postictal confusion. Patient currently denies nausea, headache, blurred vision, focal weakness. No preceding chest pain, shortness of breath, dizziness, palpitations prior to fall. Patient reports right ankle pain with swelling.   HPI  Past Medical History:  Diagnosis Date  . Chronic cough   . Diabetes mellitus without complication (HCC)   . Hypertension   . OSA (obstructive sleep apnea)   . Peripheral neuropathy (HCC)   . Wegener's granulomatosis (HCC) 2009  . Wegner's disease (congenital syphilitic osteochondritis)     Patient Active Problem List   Diagnosis Date Noted  . Fever of unknown origin 07/07/2016  . OSA (obstructive sleep apnea) 07/04/2016  . Peripheral neuropathy (HCC) 07/04/2016  . Fever 07/04/2016  . Hypotension 07/04/2016  . Rash 07/04/2016  . CKD (chronic kidney disease) stage 3, GFR 30-59 ml/min 07/04/2016  . Adrenal insufficiency (HCC) 05/30/2013  . Wegner's disease (congenital syphilitic osteochondritis)     Past Surgical History:  Procedure Laterality Date  . APPENDECTOMY    . BACK SURGERY    . CHOLECYSTECTOMY    . COLONOSCOPY N/A 06/02/2013   Procedure: COLONOSCOPY;  Surgeon: Graylin Shiver, MD;  Location: [facility] ENDOSCOPY;  Service: Endoscopy;  Laterality: N/A;  . HERNIA REPAIR  2000  . INCISION AND DRAINAGE ABSCESS Left 07/07/2016   Procedure: DEBRIDMENT LEFT THIGH ABSCESS, EXISION ACUTE SKIN RASH LEFT THIGH(1CM LESION);  Surgeon: Claud Kelp, MD;  Location: [facility] ORS;  Service: General;  Laterality: Left;  . SEPTOPLASTY  02/2013  . spleen anuyism    . splenic aneurysm    . VAGINAL HYSTERECTOMY      OB History    No data available       Home Medications    Prior to Admission medications   Medication Sig Start Date End Date Taking? Authorizing Provider  acetaminophen (TYLENOL) 325 MG tablet Take 2 tablets (650 mg total) by mouth every 6 (six) hours as needed for mild pain (or Fever >/= 101). 07/12/16   Joseph Art, DO  albuterol (PROVENTIL HFA;VENTOLIN HFA) 108 (90 Base) MCG/ACT inhaler Inhale 2 puffs into the lungs every 6 (six) hours as needed for wheezing or shortness of breath.  Historical Provider, MD  b complex vitamins tablet Take 1 tablet by mouth daily.    Historical Provider, MD  dextromethorphan-guaiFENesin (MUCINEX DM) 30-600 MG 12hr tablet Take 1 tablet by mouth 2 (two) times daily as needed for cough. 07/12/16   Joseph Art, DO  DULoxetine (CYMBALTA) 60 MG capsule Take 60 mg by mouth 2 (two) times daily.    Historical Provider, MD  fludrocortisone (FLORINEF) 0.1 MG tablet Take 0.1 mg  by mouth daily.    Historical Provider, MD  fluticasone (FLONASE) 50 MCG/ACT nasal spray Place 2 sprays into both nostrils daily as needed for rhinitis.    Historical Provider, MD  folic acid (FOLVITE) 1 MG tablet Take 1 mg by mouth daily.    Historical Provider, MD  gabapentin (NEURONTIN) 300 MG capsule Take 1,200-1,600 mg by mouth 2 (two) times daily. Pt takes three capsules in the morning and four at night.    Historical Provider, MD  HYDROcodone-acetaminophen (NORCO/VICODIN) 5-325 MG tablet Take 1-2 tablets by mouth every 4 (four) hours as needed for moderate pain or severe pain. 07/12/16   Joseph Art, DO  Lacosamide (VIMPAT) 100 MG TABS Take 100 mg by mouth 2 (two) times daily.    Historical Provider, MD  lidocaine (LIDODERM) 5 % Place 1 patch onto the skin daily as needed (for pain). Remove & Discard patch within 12 hours or as directed by MD    Historical Provider, MD  oxyCODONE (OXY IR/ROXICODONE) 5 MG immediate release tablet Take 1-2 tablets (5-10 mg total) by mouth every 4 (four) hours as needed for moderate pain. 07/12/16   Joseph Art, DO  pantoprazole (PROTONIX) 40 MG tablet Take 40 mg by mouth daily.    Historical Provider, MD  predniSONE (DELTASONE) 5 MG tablet Take 6 tablets (30 mg total) by mouth daily. 07/14/16   Mir Vergie Living, MD  topiramate (TOPAMAX) 50 MG tablet Take 50 mg by mouth at bedtime.    Historical Provider, MD  traZODone (DESYREL) 50 MG tablet Take 50 mg by mouth at bedtime.    Historical Provider, MD  vitamin B-12 (CYANOCOBALAMIN) 1000 MCG tablet Take 1,000 mcg by mouth daily.    Historical Provider, MD  Vitamin D, Ergocalciferol, (DRISDOL) 50000 units CAPS capsule Take 50,000 Units by mouth every 7 (seven) days. Pt takes on Friday.    Historical Provider, MD    Family History Family History  Problem Relation Age of Onset  . Diabetes Mother   . Heart disease Mother   . Diabetes Father     Social History Social History  Substance Use Topics  .  Smoking status: Never Smoker  . Smokeless tobacco: Never Used  . Alcohol use Yes     Comment: occasionally     Allergies   Motrin [ibuprofen] and Penicillins   Review of Systems Review of Systems  Constitutional: Positive for appetite change, chills, diaphoresis and fever.  HENT: Positive for congestion, postnasal drip, rhinorrhea and sore throat.   Eyes: Negative for visual disturbance.  Respiratory: Positive for cough. Negative for choking, chest tightness and shortness of breath.   Cardiovascular: Negative for chest pain, palpitations and leg swelling.  Gastrointestinal: Positive for abdominal pain, diarrhea, nausea and vomiting. Negative for constipation.  Genitourinary: Negative for difficulty urinating, dysuria and hematuria.  Musculoskeletal: Positive for joint swelling (ankle). Negative for arthralgias.  Skin: Negative for rash.  Allergic/Immunologic: Positive for immunocompromised state.  Neurological: Positive for syncope. Negative for dizziness, seizures, weakness, light-headedness, numbness and headaches.  Hematological: Does not bruise/bleed easily.  Psychiatric/Behavioral: Negative.      Physical Exam Updated Vital Signs BP 99/66   Pulse 69   Temp 99.7 F (37.6 C) (Rectal)   Resp 20   SpO2 99%   Physical Exam  Constitutional: She is oriented to person, place, and time. She appears well-developed and well-nourished.  Clammy skin  HENT:  Head: Normocephalic and atraumatic.  Nose: Nose normal.  Mouth/Throat: Oropharynx is clear and moist. No oropharyngeal exudate.  Eyes: Conjunctivae and EOM are normal. Pupils are equal, round, and reactive to light.  Neck: Normal range of motion. Neck supple. No JVD present.  Cardiovascular: Normal rate, regular rhythm, normal heart sounds and intact distal pulses.   No murmur heard. Pulmonary/Chest: Effort normal and breath sounds normal. No respiratory distress. She has no wheezes. She has no rales. She exhibits no  tenderness.  Abdominal: Soft. Bowel sounds are normal. She exhibits no distension and no mass. There is tenderness. There is no rebound and no guarding.  Abdomen is diffusely tender  Musculoskeletal: Normal range of motion. She exhibits edema and tenderness. She exhibits no deformity.  Left ankle edema, tenderness without ecchymosis, erythema or warmth.  PositiveTalar tilt. Full passive ROM of ankle with minimal pain, no crepitus Patient able to bear weight in ED (2+ steps).   No bony tenderness over posterior aspect of lateral and medial malleoli, navicular bone or 5th metatarsal base.   Achilles tendon is non tender. Negative anterior and posterior drawer.  Negative syndesmosis squeeze test. Negative Thompson test.   Lymphadenopathy:    She has no cervical adenopathy.  Neurological: She is alert and oriented to person, place, and time. No sensory deficit.  Skin: Skin is warm and dry. Capillary refill takes less than 2 seconds.  Clammy to touch  Psychiatric: She has a normal mood and affect. Her behavior is normal. Judgment and thought content normal.  Nursing note and vitals reviewed.    ED Treatments / Results  Labs (all labs ordered are listed, but only abnormal results are displayed) Labs Reviewed  C DIFFICILE QUICK SCREEN W PCR REFLEX - Abnormal; Notable for the following:       Result Value   C Diff antigen POSITIVE (*)    All other components within normal limits  COMPREHENSIVE METABOLIC PANEL - Abnormal; Notable for the following:    Sodium 134 (*)    Potassium 2.8 (*)    Chloride 99 (*)    CO2 20 (*)    Glucose, Bld 202 (*)    Creatinine, Ser 2.09 (*)    Calcium 8.6 (*)    Total Protein 6.2 (*)    Albumin 2.9 (*)    AST 69 (*)    GFR calc non Af Amer 26 (*)    GFR calc Af Amer 30 (*)    All other components within normal limits  LIPASE, BLOOD - Abnormal; Notable for the following:    Lipase <10 (*)    All other components within normal limits  URINALYSIS,  ROUTINE W REFLEX MICROSCOPIC - Abnormal; Notable for the following:    APPearance HAZY (*)    All other components within normal limits  CBG MONITORING, ED - Abnormal; Notable for the following:    Glucose-Capillary 151 (*)    All other components within normal limits  CLOSTRIDIUM DIFFICILE BY PCR  Rosezena Sensor, ED  POC URINE PREG, ED    EKG  EKG Interpretation  Date/Time:  Friday January 12 2017 12:39:31  EDT Ventricular Rate:  82 PR Interval:    QRS Duration: 99 QT Interval:  474 QTC Calculation: 554 R Axis:   -44 Text Interpretation:  Sinus rhythm Left axis deviation Abnormal R-wave progression, early transition Borderline ST depression, anterior leads Prolonged QT interval Since last tracing QT has lengthened Confirmed by Effie Shy  MD, ELLIOTT [phone]) on 01/12/2017 5:56:02 PM       Radiology Dg Chest 2 View  Result Date: 01/12/2017 CLINICAL DATA:  Fever with nausea and cough EXAM: CHEST  2 VIEW COMPARISON:  Chest radiograph July 04, 2016 and chest CT July 09, 2016 FINDINGS: Lungs are clear. Heart size and pulmonary vascularity are normal. No adenopathy. No pneumothorax. No bone lesions. IMPRESSION: No edema or consolidation. Electronically Signed   By: Bretta Bang III M.D.   On: 01/12/2017 12:41   Dg Ankle Complete Right  Result Date: 01/12/2017 CLINICAL DATA:  Pain and swelling EXAM: RIGHT ANKLE - COMPLETE 3+ VIEW COMPARISON:  None. FINDINGS: Frontal, oblique, and lateral views were obtained. There is somewhat generalized soft tissue swelling. No evident fracture or joint effusion. Ankle mortise appears intact. No appreciable joint space narrowing. There are spurs arising from the posterior and inferior calcaneus. IMPRESSION: Soft tissue swelling, most notably in the dorsum of the foot. Calcaneal spurs. No joint space narrowing or erosion. No fracture. Ankle mortise appears intact. Electronically Signed   By: Bretta Bang III M.D.   On: 01/12/2017 12:40     Procedures Procedures (including critical care time)  Medications Ordered in ED Medications  sodium chloride 0.9 % bolus 1,000 mL (0 mLs Intravenous Stopped 01/12/17 1803)  ondansetron (ZOFRAN) injection 4 mg (4 mg Intravenous Given 01/12/17 1158)  sodium chloride 0.9 % bolus 1,000 mL (0 mLs Intravenous Stopped 01/12/17 1803)  potassium chloride 30 mEq in sodium chloride 0.9 % 265 mL (KCL MULTIRUN) IVPB (30 mEq Intravenous Given 01/12/17 1515)     Initial Impression / Assessment and Plan / ED Course  I have reviewed the triage vital signs and the nursing notes.  Pertinent labs & imaging results that were available during my care of the patient were reviewed by me and considered in my medical decision making (see chart for details).  Clinical Course as of Jan 13 1944  Fri Jan 12, 2017  1354 IMPRESSION: Soft tissue swelling, most notably in the dorsum of the foot. Calcaneal spurs. No joint space narrowing or erosion. No fracture. Ankle mortise appears intact. DG Ankle Complete Right [CG]  1355 FINDINGS: Lungs are clear. Heart size and pulmonary vascularity are normal. No adenopathy. No pneumothorax. No bone lesions.  IMPRESSION: No edema or consolidation. DG Chest 2 View [CG]  1355 Pulse Rate: 81 [CG]  1355 BP: 95/72 [CG]  1355 SpO2: 92 % [CG]  1710 Troponin i, poc: 0.02 [CG]  1711 Potassium: (!) 2.8 [CG]  1711 Creatinine: (!) 2.09 [CG]  1711 Specific Gravity, Urine: 1.012 [CG]  1711 Nitrite: NEGATIVE [CG]  1711 Leukocytes, UA: NEGATIVE [CG]  1711 Lipase: (!) <10 [CG]  1937 Sinus rhythm Left axis deviation Abnormal R-wave progression, early transition Borderline ST depression, anterior leads Prolonged QT interval Since last tracing QT has lengthened   [CG]    Clinical Course User Index [CG] Liberty Handy, PA-C   51 year old female presents with influenza-like symptoms and what sounds to be to two pre-syncopal/syncopal episodes without head trauma. History of  present illness also suspicious of C. difficile given her chronic use of Bactrim. Vital signs are reassuring in  the ED.  Patient does have history of hypokalemia and is not compliant with her potassium supplements. Her potassium today was 2.8. Creatinine up to 2.09 (within normal limits as September 2017). EKG showed prolonged QTC (in 500s) otherwise no ischemic changes or arrhythmias, previous EKG in September 2017 QTc was within normal limits, at that time K was within normal limits. Patient denied prodromal symptoms including chest pain, dizziness, shortness of breath, diaphoresis, palpitations before syncopal episodes.  She does tell me she has "cardiogenic syncope" but states it has been many years that this was a problem and states her syncope today was unlike her previous syncopal episodes. Patient cannot provide me details in terms of her cardiogenic syncope and I was not able to find note of this on chart.  Unclear.  Suspect hypokalemia contributing, possibly dehydration, vasovagal.  Less likely arrhythmias.  C.diff testing negative today.  Patient was given 2L IVF, zofran and K+ in ED.  Patient did not have any episodes of emesis or diarrhea while in ED.  She reported improvement in symptoms, asked for food which she tolerated without difficulty.  Ankle x-ray negative, will give brace and crutches with symptomatic tx.   Given the significant improvement of symptoms while in the ED, overall well-appearing, nontoxic appearance I consider patient safe for discharge at this point. I do not think that further emergent lab work or imaging is indicated at this time. I do not think the patient will benefit from hospital admission, not much more will be needed to be done for her as an inpatient at this time. Patient is reliable to follow-up and monitor her symptoms. I discussed QTC prolongation and hypokalemia and elevated creatinine with patient and her husband at bedside, I explained possible causes and  possible future complications of these abnormalities. I advised patient to follow-up with PCP on Monday to make an appointment for reevaluation. Patient will follow-up and get a repeat BMP and further workup for prolonged QTc interval. Strict ED return precautions given. Patient was eager to go home, states she feels much better.   Patient, ED treatment and discharge plan was discussed with supervising physician who is agreeable with plan.   Final Clinical Impressions(s) / ED Diagnoses   Final diagnoses:  Pain  Nausea vomiting and diarrhea  Acute right ankle pain  Influenza-like illness    New Prescriptions New Prescriptions   No medications on file     Liberty HandyClaudia J Kahleah Crass, PA-C 01/12/17 1945    Mancel BaleElliott Wentz, MD 01/13/17 [phone]

## 2017-01-12 NOTE — ED Notes (Signed)
Patient transported to X-ray 

## 2017-01-12 NOTE — ED Notes (Signed)
Ortho tech called to apply ankle brace and crutches.

## 2017-01-12 NOTE — Discharge Instructions (Signed)
Please resume taking your potassium supplements as prescribed.   Please wear your ankle brace and use crutches for the next 2 days to rest your right ankle.  After 2 days, begin doing light ankle range of motion exercises. Take tylenol as needed for inflammation and pain.    Your clostridium difficile testing was NEGATIVE today. No further treatment is indicated.  Your recent fevers, nausea, vomiting, diarrhea, increased nasal discharge are very suspicious of influenza.  Since your symptoms started >48 hours tamiflu is no longer indicated as it will have no effect after 48 hours.  Please monitor your symptoms, take tylenol for fever.  We recommend a re-evaluation by your primary care provider next week to ensure your symptoms are improving.  Drink plenty of water (>2L of water per day) to avoid dehydration.  Please ask your primary care provider to recheck your basic metabolic panel and EKG.  We recommend further work up for your prolonged QTc interval.  Follow up with your cardiologist or PCP for further work up next week.   Return to the ED if your symptoms worsen, you develop chest pain, shortness of breath, palpitations.

## 2017-03-09 DIAGNOSIS — N816 Rectocele: Secondary | ICD-10-CM | POA: Insufficient documentation

## 2017-03-09 DIAGNOSIS — R3911 Hesitancy of micturition: Secondary | ICD-10-CM | POA: Insufficient documentation

## 2017-05-24 ENCOUNTER — Other Ambulatory Visit: Payer: Self-pay | Admitting: Nurse Practitioner

## 2017-05-24 DIAGNOSIS — Z1231 Encounter for screening mammogram for malignant neoplasm of breast: Secondary | ICD-10-CM

## 2017-06-14 ENCOUNTER — Ambulatory Visit
Admission: RE | Admit: 2017-06-14 | Discharge: 2017-06-14 | Disposition: A | Discharge status: Home / Self Care | Payer: BC Managed Care – PPO | Source: Ambulatory Visit | Attending: Nurse Practitioner | Admitting: Nurse Practitioner

## 2017-06-14 DIAGNOSIS — Z1231 Encounter for screening mammogram for malignant neoplasm of breast: Secondary | ICD-10-CM

## 2017-08-02 DIAGNOSIS — Z978 Presence of other specified devices: Secondary | ICD-10-CM | POA: Insufficient documentation

## 2017-12-14 NOTE — Progress Notes (Signed)
Cardiology Office Note   Date:  12/17/2017   ID:  [name], DOB 1966/02/27, MRN [id number]  PCP:  Elizabeth PalauAnderson, [name], [name]  Cardiologist:   Peter SwazilandJordan, MD   Chief Complaint  Patient presents with  . Follow-up      History of Present Illness: [name] is a 52 y.o. 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 She has a history of DM, HTN, OSA, and Wegener's granulomatosis. She also has neuropathy with chronic pain. Prior cardiac work up includes a normal Echo in 2012 at the [facility]. She also had a tilt table test in October 2014 and has been treated for Neurocardiogenic syncope with chronic Florinef. In 2015 she had [facility] which was normal albeit with poor functional status.   She reports first having syncopal spells 6-8 years ago. Noted she would suddenly get lightheaded and pass out. After above testing she was placed on Florinef with fairly good response. One year ago she ran out of her Florinef and quit taking it. Since then she has done much worse. She passed out once and fractured her ankle. She also fell and injured her coccyx. She brings BP recordings which show tendency to low BP supine and marked drop with getting upright. BP will drop to 66 systolic. Feels in a mental fog at times. Has not being using support hose. She does have a history of sleep apnea but hasn't used CPAP. States she could not tolerate mask on her nose due to [facility] from [facility] but this was before she has reconstructive surgery. She is s/p chemotherapy for Wegeners and is on chronic steroids. No palpitations. Denies history but BS show she is prediabetic.     Past Medical History:  Diagnosis Date  . Chronic cough   . Diabetes mellitus without complication (HCC)   . Hypertension   . OSA (obstructive sleep apnea)   . Peripheral neuropathy   . Wegener's granulomatosis (HCC) 2009  . Wegner's disease (congenital syphilitic  osteochondritis)     Past Surgical History:  Procedure Laterality Date  . APPENDECTOMY    . BACK SURGERY    . CHOLECYSTECTOMY    . COLONOSCOPY N/A 06/02/2013   Procedure: COLONOSCOPY;  Surgeon: Graylin ShiverSalem F Ganem, MD;  [facility];  Service: Endoscopy;  Laterality: N/A;  . HERNIA REPAIR  2000  . INCISION AND DRAINAGE ABSCESS Left 07/07/2016   Procedure: DEBRIDMENT LEFT THIGH ABSCESS, EXISION ACUTE SKIN RASH LEFT THIGH(1CM LESION);  Surgeon: Claud KelpHaywood Ingram, MD;  Location: [facility] ORS;  Service: General;  Laterality: Left;  . SEPTOPLASTY  02/2013  . spleen anuyism    . splenic aneurysm    . VAGINAL HYSTERECTOMY       Current Outpatient Medications  Medication Sig Dispense Refill  . acetaminophen (TYLENOL) 325 MG tablet Take 2 tablets (650 mg total) by mouth every 6 (six) hours as needed for mild pain (or Fever >/= 101).    Marland Kitchen. albuterol (PROVENTIL HFA;VENTOLIN HFA) 108 (90 Base) MCG/ACT inhaler Inhale 2 puffs into the lungs every 6 (six) hours as needed for wheezing or shortness of breath.    Marland Kitchen. b complex vitamins tablet Take 1 tablet by mouth daily.    Marland Kitchen. dextromethorphan-guaiFENesin (MUCINEX DM) 30-600 MG 12hr tablet Take 1 tablet by mouth 2 (two) times daily as needed for cough.    . DULoxetine (CYMBALTA) 60 MG capsule Take 60 mg by mouth 2 (two) times daily.    .Marland Kitchen  fluticasone (FLONASE) 50 MCG/ACT nasal spray Place 2 sprays into both nostrils daily as needed for rhinitis.    . folic acid (FOLVITE) 1 MG tablet Take 1 mg by mouth daily.    Marland Kitchen gabapentin (NEURONTIN) 300 MG capsule Take 1,200-1,600 mg by mouth 2 (two) times daily. Pt takes three capsules in the morning and four at night.    . Lacosamide (VIMPAT) 100 MG TABS Take 100 mg by mouth 2 (two) times daily.    Marland Kitchen lidocaine (LIDODERM) 5 % Place 1 patch onto the skin daily as needed (for pain). Remove & Discard patch within 12 hours or as directed by MD    . Karlene Einstein 145 MCG CAPS capsule Take 145 mcg by mouth daily.  5  . methotrexate 50  MG/2ML injection Inject 1 mL into the skin.    Marland Kitchen metroNIDAZOLE (FLAGYL) 500 MG tablet 2 (two) times daily.  0  . pantoprazole (PROTONIX) 40 MG tablet Take 40 mg by mouth daily.    Marland Kitchen sulfamethoxazole-trimethoprim (BACTRIM,SEPTRA) 400-80 MG tablet TAKE 1 TABLET 3 TIMES A WEEK  3  . tizanidine (ZANAFLEX) 2 MG capsule TAKE ONE CAPSULE BY MOUTH 3 TIMES A DAY AS NEEDED FOR MUSCLE SPASMS    . topiramate (TOPAMAX) 50 MG tablet Take 50 mg by mouth at bedtime.    . traZODone (DESYREL) 50 MG tablet Take 50 mg by mouth at bedtime.    . VESICARE 10 MG tablet Take 10 mg by mouth daily.  0  . vitamin B-12 (CYANOCOBALAMIN) 1000 MCG tablet Take 1,000 mcg by mouth daily.    . Vitamin D, Ergocalciferol, (DRISDOL) 50000 units CAPS capsule Take 50,000 Units by mouth every 7 (seven) days. Pt takes on Friday.    . fludrocortisone (FLORINEF) 0.1 MG tablet Take 2 tablets (0.2 mg total) by mouth daily. 30 tablet 11   No current facility-administered medications for this visit.     Allergies:   Motrin [ibuprofen] and Penicillins    Social History:  The patient  reports that  has never smoked. she has never used smokeless tobacco. She reports that she drinks alcohol. She reports that she does not use drugs.   Family History:  The patient's family history includes Diabetes in her father and mother; Heart disease in her mother.    ROS:  Please see the history of present illness.   Otherwise, review of systems are positive for none.   All other systems are reviewed and negative.    PHYSICAL EXAM: VS:  BP 105/73   Pulse (!) 55  , BMI There is no height or weight on file to calculate BMI. GEN: Well nourished, obese, in no acute distress  HEENT: normal  Neck: no JVD, carotid bruits, or masses Cardiac: RRR; no murmurs, rubs, or gallops,no edema  Respiratory:  clear to auscultation bilaterally, normal work of breathing GI: soft, nontender, nondistended, + BS MS: no deformity or atrophy  Skin: warm and dry, no  rash Neuro:  Strength and sensation are intact Psych: euthymic mood, full affect   EKG:  EKG is ordered today. The ekg ordered today demonstrates NSR rate 55. Nonspecific ST- T wave abnormality in the anterior leads. I have personally reviewed and interpreted this study.    Recent Labs: 01/12/2017: ALT 32; BUN 11; Creatinine, Ser 2.09; Potassium 2.8; Sodium 134    Lipid Panel No results found for: CHOL, TRIG, HDL, CHOLHDL, VLDL, LDLCALC, LDLDIRECT    Wt Readings from Last 3 Encounters:  07/14/16 231 lb 0.7  oz (104.8 kg)  06/07/16 215 lb (97.5 kg)  06/02/13 245 lb 14.4 oz (111.5 kg)      Other studies Reviewed: Additional studies/ records that were reviewed today include: see H&P  Labs dated 05/09/17: potassium 3.3. Other CMET and CBC normal. Dated 12/21/16: cholesterol 234, triglycerides 342, HDL 35, LDL 131.    ASSESSMENT AND PLAN:  1.  Neurocardiogenic syncope with component of orthostatic hypotension as well. Prior evaluation as noted above. I do not feel additional work up needed at this time. We will resume pharmacologic therapy with Florinef 0.2 mg daily. She may be a candidate for midodrin as well. Discussed non pharmacologic therapy as well. Will start moderate support compression hose. Recommend she never lie flat and raise the head of her bed so she is on an incline. Possible consider abdominal binder. Instructed to liberalize sodium in diet and maintain good hydration.  2. OSA. Will arrange for overnight oximetry. If abnormal may need formal sleep study. 3. Obesity 4. Prediabetes.  5. Wegener's granulomatosis.  6. Peripheral neuropathy.   Current medicines are reviewed at length with the patient today.  The patient does not have concerns regarding medicines.  The following changes have been made:  Add Florinef 0.2 mg daily. See note above.   Labs/ tests ordered today include:   Orders Placed This Encounter  Procedures  . Compression stockings  . Pulse  oximetry, overnight  . EKG 12-Lead     Disposition:   FU with me in 3 months  Signed, Peter Swaziland, MD  12/17/2017 2:29 PM    [facility] [address], [place], [place], [address] Phone [phone], Fax [phone]

## 2017-12-17 ENCOUNTER — Encounter: Payer: Self-pay | Admitting: Cardiology

## 2017-12-17 ENCOUNTER — Ambulatory Visit: Payer: BC Managed Care – PPO | Admitting: Cardiology

## 2017-12-17 VITALS — BP 105/73 | HR 55

## 2017-12-17 DIAGNOSIS — R55 Syncope and collapse: Secondary | ICD-10-CM

## 2017-12-17 DIAGNOSIS — G4733 Obstructive sleep apnea (adult) (pediatric): Secondary | ICD-10-CM

## 2017-12-17 MED ORDER — FLUDROCORTISONE ACETATE 0.1 MG PO TABS: 0.2000 mg | ORAL_TABLET | Freq: Every day | ORAL | 11 refills | Status: DC

## 2017-12-17 NOTE — Patient Instructions (Addendum)
Wear support hose.  Sleep with your bed on an incline  We will resume Florinef 0.2 mg daily  We will follow up in a couple of months  You can liberalize your salt intake  We will schedule you for an at home sleep evaluation Over night oximetry

## 2017-12-20 ENCOUNTER — Telehealth: Payer: Self-pay | Admitting: Cardiology

## 2017-12-20 MED ORDER — MIDODRINE HCL 5 MG PO TABS: 5.0000 mg | ORAL_TABLET | Freq: Three times a day (TID) | ORAL | 6 refills | Status: DC

## 2017-12-20 NOTE — Telephone Encounter (Signed)
New Message   Pt c/o medication issue:  1. Name of Medication: fludrocortisone (FLORINEF) 0.1 MG tablet   2. How are you currently taking this medication (dosage and times per day)? 2 times a day   3. Are you having a reaction (difficulty breathing--STAT)? None   4. What is your medication issue? Patient husband Hessie Dienerlan is calling in reference to the new medication that was just prescribed. He states that the patient is not improving. . She is still weak and fatigue. Please call to discuss.

## 2017-12-20 NOTE — Telephone Encounter (Signed)
Spoke with pt, she continues to get low bp readings, 110/67 dropping to 60's when standing. She reports continuing to feel faint and weak. She is drinking plenty and eating salt. Discussed with the patient to exercise her legs and feet prior to getting up to help with some of the orthostatic symptoms. Patients huband is frustrated because she is not improving and wonders if he needs to take her to the hospital. Will forward for dr jordan's review and advise.

## 2017-12-20 NOTE — Telephone Encounter (Signed)
I would expect it to take more than a couple of days for the Florinef to work. We can go ahead and start midodrine 5 mg three times a day. Continue other recommendations.  Vidur Knust SwazilandJordan MD, [facility]

## 2017-12-20 NOTE — Telephone Encounter (Signed)
Returned call to patient's husband Dr.Jordan's recommendations given.Advised to call back if needed.

## 2017-12-24 ENCOUNTER — Telehealth: Payer: Self-pay | Admitting: Cardiology

## 2017-12-24 NOTE — Telephone Encounter (Signed)
Ashly calling with [employer], states that patient had an overnight oximetry test and qualifies for oxygen. Ashly needs some additional info to go with order so that patient can get set-up with oxygen.

## 2017-12-24 NOTE — Telephone Encounter (Signed)
New Message:    Caregiver called asking for overnight oxygen for patient stating that Dr. SwazilandJordan has done this before.

## 2017-12-24 NOTE — Telephone Encounter (Signed)
Returned call to [place]uley Bridge[place]ley with [employer] [facility], states they are unsure if overnight oximetry results were received but patients baseline was 90% at night, dropped to 85 %, and had a total of 53 mins below 88% which qualifies for overnight O2. States he will fax over order form as well as results again for Dr.Jordan to sign.  Advised I would make his nurse aware.

## 2017-12-24 NOTE — Telephone Encounter (Signed)
Left message for [name] to call

## 2018-01-02 ENCOUNTER — Telehealth: Payer: Self-pay | Admitting: Cardiology

## 2018-01-02 DIAGNOSIS — R7981 Abnormal blood-gas level: Secondary | ICD-10-CM

## 2018-01-02 NOTE — Telephone Encounter (Signed)
Carlisle BeersAshley w/ [employer] is calling to see if Dr. SwazilandJordan received the order and if he has signed it as of yet . Please call at  [phone]

## 2018-01-02 NOTE — Telephone Encounter (Signed)
I would increase midodrine to 10 mg tid. I would order an abdominal binder for her. I would like her to see Dr. Berton MountSteve Klein to get his opinion about her treatment. Please refer.  [name] SwazilandJordan MD, [facility]

## 2018-01-02 NOTE — Telephone Encounter (Signed)
[name] is calling because [name] Blood Pressure is not where it should be . Please call

## 2018-01-02 NOTE — Telephone Encounter (Addendum)
Husband concerned that BP is continuing to drop too low and wanted to know if there were any other treatment options. Patient is checking BP 1-2 times daily at home. Saw PCP today and had orthostatic BP monitoring: lying HR 56 & BP106/71; sitting HR 61 & BP 99/73; standing HR 64 & BP 76/54. Husband advised that concerns would be sent to her cardiologist.

## 2018-01-03 MED ORDER — MIDODRINE HCL 10 MG PO TABS: 10.0000 mg | ORAL_TABLET | Freq: Three times a day (TID) | ORAL | 6 refills | Status: DC

## 2018-01-03 NOTE — Telephone Encounter (Signed)
Returned call to patient's husband.Dr.Jordan's recommendations given.Advised she can purchase abdominal binder [facility], does not need a prescription.Scheduler will call back with appointment with Dr.Klein and sleep study appointment

## 2018-01-03 NOTE — Telephone Encounter (Signed)
Returned call to [place]ley with [employer].Dr.Jordan ordered a sleep study.  Called patient no answer.LMTC.

## 2018-01-04 ENCOUNTER — Telehealth: Payer: Self-pay | Admitting: *Deleted

## 2018-01-04 NOTE — Telephone Encounter (Signed)
Patient notified of sleep study scheduled for 01/20/18. Per [employer] sleep study does not need PA. reference ID: [id number] 01/04/18.

## 2018-01-15 ENCOUNTER — Encounter: Payer: Self-pay | Admitting: Internal Medicine

## 2018-01-20 ENCOUNTER — Ambulatory Visit (HOSPITAL_BASED_OUTPATIENT_CLINIC_OR_DEPARTMENT_OTHER): Payer: BC Managed Care – PPO | Attending: Cardiology | Admitting: Cardiovascular Disease

## 2018-01-20 VITALS — Ht 66.0 in | Wt 200.0 lb

## 2018-01-20 DIAGNOSIS — R0683 Snoring: Secondary | ICD-10-CM | POA: Diagnosis not present

## 2018-01-20 DIAGNOSIS — R0902 Hypoxemia: Secondary | ICD-10-CM | POA: Insufficient documentation

## 2018-01-20 DIAGNOSIS — R942 Abnormal results of pulmonary function studies: Secondary | ICD-10-CM | POA: Diagnosis present

## 2018-01-20 DIAGNOSIS — G4733 Obstructive sleep apnea (adult) (pediatric): Secondary | ICD-10-CM | POA: Diagnosis not present

## 2018-01-20 DIAGNOSIS — R7981 Abnormal blood-gas level: Secondary | ICD-10-CM

## 2018-01-25 ENCOUNTER — Encounter: Payer: Self-pay | Admitting: Internal Medicine

## 2018-01-25 ENCOUNTER — Encounter: Payer: Self-pay | Admitting: *Deleted

## 2018-01-25 ENCOUNTER — Ambulatory Visit (INDEPENDENT_AMBULATORY_CARE_PROVIDER_SITE_OTHER): Payer: BC Managed Care – PPO | Admitting: Internal Medicine

## 2018-01-25 VITALS — BP 107/70 | HR 73 | Ht 66.0 in | Wt 208.0 lb

## 2018-01-25 DIAGNOSIS — R55 Syncope and collapse: Secondary | ICD-10-CM | POA: Diagnosis not present

## 2018-01-25 DIAGNOSIS — G629 Polyneuropathy, unspecified: Secondary | ICD-10-CM | POA: Insufficient documentation

## 2018-01-25 NOTE — Patient Instructions (Addendum)
Medication Instructions:  Your physician recommends that you continue on your current medications as directed. Please refer to the Current Medication list given to you today.  Labwork: None ordered.  Testing/Procedures: None ordered.  Follow-Up:  02/08/2018 @ 9:40 am with Dr. Swaziland (already scheduled).  Your physician wants you to follow-up in: as needed with Dr. Ladona Ridgel.     Any Other Special Instructions Will Be Listed Below (If Applicable).  If you need a refill on your cardiac medications before your next appointment, please call your pharmacy.   Postural Orthostatic Tachycardia Syndrome Postural orthostatic tachycardia syndrome (POTS) is a group of symptoms that can occur when you stand up after lying down. POTS happens when less blood flows to the heart than normal after you stand up. The reduced blood flow to the heart makes the heart beat rapidly. POTS may be associated with another medical condition, or it may occur on its own. What are the causes? This cause of this condition is not known, but many conditions and diseases have been linked to it. What increases the risk? This condition is more likely to develop in:  Women 65-40 years old.  Women who are pregnant.  Women who are menstruating.  People who have certain conditions, including: ? A viral infection. ? An autoimmune disease. ? Anemia. ? Dehydration. ? Hyperthyroidism.  People who take certain medicines.  People who have had a major injury.  People who have had surgery.  What are the signs or symptoms? The most common symptom of this condition is lightheadedness after standing up from a lying down position. Other symptoms may include:  Feeling a rapid increase in the speed of the heartbeat (tachycardia) within 10 minutes of standing up.  Fainting.  Weakness.  Confusion.  Trembling.  Shortness of breath.  Sweating or flushing.  Headache.  Chest pain.  Breathing that is deeper and  faster than normal (hyperventilation).  Nausea.  Anxiety.  Symptoms may be worse in the morning, and they may be relieved by lying down. How is this diagnosed? This condition is diagnosed based on:  Your symptoms.  Your medical history.  A physical exam.  Measurements of your heart rate when you are lying down and after you stand up.  A measurement of your blood pressure. The measurement will be taken when you go from lying down to standing up.  Blood tests to measure hormones that change with blood pressure. The blood tests will be done when you are lying down and standing up.  You may have other tests to check whether you have a condition or disease that is linked to POTS. How is this treated? Treatment for this condition depends on how severe your symptoms are and whether you have any conditions or diseases that have been linked to POTS. Treatment may involve:  Treating any conditions or diseases that have been linked to POTS.  Drinking two glasses of water before getting up from a lying position.  Eating more salt (sodium).  Taking medicine to control blood pressure and heart rate (beta-blocker).  Taking medicines to control blood flow, blood pressure, or heart rate.  Avoiding certain medicines.  Starting an exercise program under the supervision of your health care provider.  Follow these instructions at home:  Eating and drinking  Drink enough fluid to keep your urine clear or pale yellow.  If told by your health care provider, drink two glasses of water before getting up from a lying position.  Follow instructions from your health  care provider about how much sodium you should eat.  Eat several small meals a day instead of a few large meals.  Avoid heavy meals. Medicines  Take over-the-counter and prescription medicines only as told by your health care provider.  Talk with your health care provider before starting any new medicines. Activity  Do an  aerobic exercise for 20 minutes a day, at least 3 days a week.  Ask your health care provider what kinds of exercise are safe for you. Contact a health care provider if:  Your symptoms do not improve after treatment.  Your symptoms get worse.  You develop new symptoms. Get help right away if:  You have chest pain.  You have difficulty breathing.  You have fainting episodes. This information is not intended to replace advice given to you by your health care provider. Make sure you discuss any questions you have with your health care provider. Document Released: 09/29/2002 Document Revised: 11/17/2015 Document Reviewed: 04/23/2015 Elsevier Interactive Patient Education  [employer]2018 [employer]

## 2018-01-25 NOTE — Progress Notes (Addendum)
HPI [name] is referred today by Dr. SwazilandJordan for ongoing evaluation and management of syncope. She is a pleasant middle aged woman who has a know diagnosis of neurally mediated syncope who has been on medical therapy with florinef and midodrine. She has a h/o stopping this medication but has started it back several weeks ago. The patient also has a h/o Wegeners' granulomatosis, pre-diabetes, and obesity. She thinks that she has passed out on several occaisions despite taking florinef and midodrine.  Allergies  Allergen Reactions  . Motrin [Ibuprofen] Other (See Comments)    Pt is unable to take this due to kidney problems.    . Penicillins Rash and Other (See Comments)    Has patient had a PCN reaction causing immediate rash, facial/tongue/throat swelling, SOB or lightheadedness with hypotension: No Has patient had a PCN reaction causing severe rash involving mucus membranes or skin necrosis: No Has patient had a PCN reaction that required hospitalization No Has patient had a PCN reaction occurring within the last 10 years: No If all of the above answers are "NO", then may proceed with Cephalosporin use.     Current Outpatient Medications  Medication Sig Dispense Refill  . furosemide (LASIX) 40 MG tablet Take 40 mg by mouth daily.    Marland Kitchen. acetaminophen (TYLENOL) 325 MG tablet Take 2 tablets (650 mg total) by mouth every 6 (six) hours as needed for mild pain (or Fever >/= 101).    Marland Kitchen. albuterol (PROVENTIL HFA;VENTOLIN HFA) 108 (90 Base) MCG/ACT inhaler Inhale 2 puffs into the lungs every 6 (six) hours as needed for wheezing or shortness of breath.    Marland Kitchen. b complex vitamins tablet Take 1 tablet by mouth daily.    . Calcium Carb-Cholecalciferol (CALCIUM CARBONATE-VITAMIN D3 PO) Take 1 tablet by mouth daily.    Marland Kitchen. dextromethorphan-guaiFENesin (MUCINEX DM) 30-600 MG 12hr tablet Take 1 tablet by mouth 2 (two) times daily as needed for cough.    . DULoxetine (CYMBALTA) 60 MG capsule Take 60 mg by  mouth 2 (two) times daily.    . fludrocortisone (FLORINEF) 0.1 MG tablet Take 2 tablets (0.2 mg total) by mouth daily. 30 tablet 11  . fluticasone (FLONASE) 50 MCG/ACT nasal spray Place 2 sprays into both nostrils daily as needed for rhinitis.    . folic acid (FOLVITE) 1 MG tablet Take 1 mg by mouth daily.    Marland Kitchen. gabapentin (NEURONTIN) 300 MG capsule Take 1,200-1,600 mg by mouth 2 (two) times daily. Pt takes three capsules in the morning and four at night.    . Lacosamide (VIMPAT) 100 MG TABS Take 100 mg by mouth 2 (two) times daily.    Marland Kitchen. lidocaine (LIDODERM) 5 % Place 1 patch onto the skin daily as needed (for pain). Remove & Discard patch within 12 hours or as directed by MD    . Karlene EinsteinLINZESS 145 MCG CAPS capsule Take 145 mcg by mouth daily.  5  . methotrexate 50 MG/2ML injection Inject 1 mL into the skin.    Marland Kitchen. metroNIDAZOLE (FLAGYL) 500 MG tablet 2 (two) times daily.  0  . midodrine (PROAMATINE) 10 MG tablet Take 1 tablet (10 mg total) by mouth 3 (three) times daily. 90 tablet 6  . pantoprazole (PROTONIX) 40 MG tablet Take 40 mg by mouth daily.    Marland Kitchen. sulfamethoxazole-trimethoprim (BACTRIM,SEPTRA) 400-80 MG tablet TAKE 1 TABLET 3 TIMES A WEEK  3  . tizanidine (ZANAFLEX) 2 MG capsule TAKE ONE CAPSULE BY MOUTH 3 TIMES A  DAY AS NEEDED FOR MUSCLE SPASMS    . topiramate (TOPAMAX) 50 MG tablet Take 50 mg by mouth at bedtime.    . traZODone (DESYREL) 50 MG tablet Take 50 mg by mouth at bedtime.    . VESICARE 10 MG tablet Take 10 mg by mouth daily.  0  . vitamin B-12 (CYANOCOBALAMIN) 1000 MCG tablet Take 1,000 mcg by mouth daily.    . Vitamin D, Ergocalciferol, (DRISDOL) 50000 units CAPS capsule Take 50,000 Units by mouth every 7 (seven) days. Pt takes on Friday.     No current facility-administered medications for this visit.      Past Medical History:  Diagnosis Date  . Chronic cough   . Diabetes mellitus without complication (HCC)   . Hypertension   . OSA (obstructive sleep apnea)   . Peripheral  neuropathy   . Wegener's granulomatosis (HCC) 2009  . Wegner's disease (congenital syphilitic osteochondritis)     ROS:   All systems reviewed and negative except as noted in the HPI.   Past Surgical History:  Procedure Laterality Date  . APPENDECTOMY    . BACK SURGERY    . CHOLECYSTECTOMY    . COLONOSCOPY N/A 06/02/2013   Procedure: COLONOSCOPY;  Surgeon: Graylin Shiver, MD;  Location: [facility];  Service: Endoscopy;  Laterality: N/A;  . HERNIA REPAIR  2000  . INCISION AND DRAINAGE ABSCESS Left 07/07/2016   Procedure: DEBRIDMENT LEFT THIGH ABSCESS, EXISION ACUTE SKIN RASH LEFT THIGH(1CM LESION);  Surgeon: Claud Kelp, MD;  Location: [facility];  Service: General;  Laterality: Left;  . SEPTOPLASTY  02/2013  . spleen anuyism    . splenic aneurysm    . VAGINAL HYSTERECTOMY       Family History  Problem Relation Age of Onset  . Diabetes Mother   . Heart disease Mother   . Diabetes Father      Social History   Socioeconomic History  . Marital status: Married    Spouse name: Not on file  . Number of children: Not on file  . Years of education: Not on file  . Highest education level: Not on file  Occupational History  . Not on file  Social Needs  . Financial resource strain: Not on file  . Food insecurity:    Worry: Not on file    Inability: Not on file  . Transportation needs:    Medical: Not on file    Non-medical: Not on file  Tobacco Use  . Smoking status: Never Smoker  . Smokeless tobacco: Never Used  Substance and Sexual Activity  . Alcohol use: Yes    Comment: occasionally  . Drug use: No  . Sexual activity: Not on file  Lifestyle  . Physical activity:    Days per week: Not on file    Minutes per session: Not on file  . Stress: Not on file  Relationships  . Social connections:    Talks on phone: Not on file    Gets together: Not on file    Attends religious service: Not on file    Active member of club or organization: Not on file    Attends  meetings of clubs or organizations: Not on file    Relationship status: Not on file  . Intimate partner violence:    Fear of current or ex partner: Not on file    Emotionally abused: Not on file    Physically abused: Not on file    Forced sexual activity: Not on  file  Other Topics Concern  . Not on file  Social History Narrative  . Not on file     Ht 5\' 6"  (1.676 m)   Wt 208 lb (94.3 kg)   BMI 33.57 kg/m    Orthostatic vitals demonstrate hypotension with pressures in the low 80's 2 min after standing  Physical Exam:  Well [place] yo woman, NAD HEENT: Unremarkable Neck:  No JVD, no thyromegally Lymphatics:  No adenopathy Back:  No CVA tenderness Lungs:  Clear with no wheezes HEART:  Regular rate rhythm, no murmurs, no rubs, no clicks Abd:  soft, positive bowel sounds, no organomegally, no rebound, no guarding Ext:  2 plus pulses, no edema, no cyanosis, no clubbing Skin:  No rashes no nodules Neuro:  CN II through XII intact, motor grossly intact  EKG - reviewed  Assess/Plan: 1. Autonomic dysfunction - she has fairly classic neurally mediated syncope. I have discussed the treatment options. She missed breakfast today and admits to missing meals. In addition she is on lasix. I asked her to only take lasix when her legs are swelling and to only take it once. She is encouraged to lie down when she feels a spell coming on. Finally, she is strongly encouraged to sit down when  2. Sleep apnea - She has undergone sleep study but the results are pending. 3. Obesity - I have encouraged her to eat salty foods, low in calories.

## 2018-01-27 ENCOUNTER — Encounter: Payer: Self-pay | Admitting: Internal Medicine

## 2018-02-07 NOTE — Progress Notes (Signed)
Cardiology Office Note   Date:  02/08/2018   ID:  Stacey CromerLisa [name], DOB 1966-09-24, MRN [id number]  PCP:  Stacey PalauAnderson, [name], [name]  Cardiologist:   Stacey Toops SwazilandJordan, MD   Chief Complaint  Patient presents with  . Dizziness      History of Present Illness: Stacey CromerLisa [name] is a 52 y.o. female who is seen for follow up of autonomic dysfunction/POTS. She has a history of DM, HTN, OSA, and Wegener's granulomatosis. She also has neuropathy with chronic pain. Prior cardiac work up includes a [facility]. She also had a tilt table test in October 2014 and has been treated for Neurocardiogenic syncope with chronic Florinef. [facility] which was [facility] albeit with poor functional status.   She does have a history of sleep apnea but hasn't used CPAP. States she could not tolerate mask on her nose due to [facility] but this was before she has reconstructive surgery. She is s/p chemotherapy for Wegeners and is on chronic steroids. No palpitations. Denies history but BS show she is prediabetic.   She was placed on Florinef with persistent symptoms. She was later added on midodrin. Symptoms have persisted and she was seen in consult by Dr. Sharrell KuGreg [name] on 01/25/18. It was felt that she had classic Neurocardiogenic syncope symptoms with autonomic dysfunction. Continue medical and lifestyle modification recommended. She did have a sleep study and this has not been read yet.  She states her symptoms are better. She has stopped taking lasix although she complains the swelling in her feet makes her neuropathy worse. She still has bad dizzy spells about twice a week and has to sit down. She is wearing "Spanx". Keeps head of bed elevated. Mental fogginess has improved. [facility] and potassium down to 2.6. Supplement increased.    Past Medical History:  Diagnosis Date  . Chronic cough   . Diabetes mellitus without complication (HCC)   .  Hypertension   . OSA (obstructive sleep apnea)   . Peripheral neuropathy   . Wegener's granulomatosis (HCC) 2009  . Wegner's disease (congenital syphilitic osteochondritis)     Past Surgical History:  Procedure Laterality Date  . APPENDECTOMY    . BACK SURGERY    . CHOLECYSTECTOMY    . COLONOSCOPY N/A 06/02/2013   Procedure: COLONOSCOPY;  Surgeon: Graylin ShiverSalem F Ganem, MD;  [facility];  Service: Endoscopy;  Laterality: N/A;  . HERNIA REPAIR  2000  . INCISION AND DRAINAGE ABSCESS Left 07/07/2016   Procedure: DEBRIDMENT LEFT THIGH ABSCESS, EXISION ACUTE SKIN RASH LEFT THIGH(1CM LESION);  Surgeon: Claud KelpHaywood Ingram, MD;  Location: [facility];  Service: General;  Laterality: Left;  . SEPTOPLASTY  02/2013  . spleen anuyism    . splenic aneurysm    . VAGINAL HYSTERECTOMY       Current Outpatient Medications  Medication Sig Dispense Refill  . acetaminophen (TYLENOL) 325 MG tablet Take 2 tablets (650 mg total) by mouth every 6 (six) hours as needed for mild pain (or Fever >/= 101).    Marland Kitchen. albuterol (PROVENTIL HFA;VENTOLIN HFA) 108 (90 Base) MCG/ACT inhaler Inhale 2 puffs into the lungs every 6 (six) hours as needed for wheezing or shortness of breath.    Marland Kitchen. b complex vitamins tablet Take 1 tablet by mouth daily.    . Calcium Carb-Cholecalciferol (CALCIUM CARBONATE-VITAMIN D3 PO) Take 1 tablet by mouth daily.    . clotrimazole [facility](MYCELEX)  10 MG troche Take 10 mg by mouth daily as needed.    . conjugated estrogens (PREMARIN) vaginal cream Place 1 Applicatorful vaginally 2 (two) times a week.    Marland Kitchen dextromethorphan-guaiFENesin (MUCINEX DM) 30-600 MG 12hr tablet Take 1 tablet by mouth 2 (two) times daily as needed for cough.    . DULoxetine (CYMBALTA) 60 MG capsule Take 60 mg by mouth 2 (two) times daily.    . ferrous sulfate 325 (65 FE) MG tablet Take 325 mg by mouth daily with breakfast.    . fludrocortisone (FLORINEF) 0.1 MG tablet Take 2 tablets (0.2 mg total) by mouth daily. 30 tablet 11  .  fluticasone (FLONASE) 50 MCG/ACT nasal spray Place 2 sprays into both nostrils daily as needed for rhinitis.    . folic acid (FOLVITE) 1 MG tablet Take 1 mg by mouth daily.    . furosemide (LASIX) 40 MG tablet Take 40 mg by mouth as directed.     . gabapentin (NEURONTIN) 300 MG capsule Take 1,200-1,600 mg by mouth 2 (two) times daily. Pt takes three capsules in the morning and four at night.    . lidocaine (LIDODERM) 5 % Place 1 patch onto the skin daily as needed (for pain). Remove & Discard patch within 12 hours or as directed by MD    . Karlene Einstein 145 MCG CAPS capsule Take 145 mcg by mouth daily.  5  . methotrexate 50 MG/2ML injection Inject 1 mL into the skin.    Marland Kitchen metroNIDAZOLE (FLAGYL) 500 MG tablet 500 mg as directed.   0  . midodrine (PROAMATINE) 10 MG tablet Take 1 tablet (10 mg total) by mouth 3 (three) times daily. 90 tablet 6  . montelukast (SINGULAIR) 10 MG tablet Take 10 mg by mouth daily.  3  . Mouthwash Compounding Base (MOUTH WASH-GP) LIQD Take by mouth. Lidocaine-Diphenhydramine-Aluminum-Magnesium-Simethicone    . mupirocin ointment (BACTROBAN) 2 % Place 1 application into the nose daily as needed.    . pantoprazole (PROTONIX) 40 MG tablet Take 40 mg by mouth daily.    . predniSONE (DELTASONE) 5 MG tablet Take 5 mg by mouth daily with breakfast.    . PRESCRIPTION MEDICATION by Intrathecal route as directed. ziconotide (PF) 25 mcg/mL, BUPivacaine (PF) 18 mg/mL 20 mL intrathecal pump refill    . sulfamethoxazole-trimethoprim (BACTRIM,SEPTRA) 400-80 MG tablet TAKE 1 TABLET 3 TIMES A WEEK  3  . tizanidine (ZANAFLEX) 2 MG capsule TAKE ONE CAPSULE BY MOUTH 3 TIMES A DAY AS NEEDED FOR MUSCLE SPASMS    . topiramate (TOPAMAX) 100 MG tablet Take 100 mg by mouth daily.    . traZODone (DESYREL) 50 MG tablet Take 50 mg by mouth at bedtime.    . valACYclovir (VALTREX) 500 MG tablet Take 1,000 mg by mouth 2 (two) times daily as needed.    . vitamin B-12 (CYANOCOBALAMIN) 1000 MCG tablet Take 1,000  mcg by mouth daily.    . Vitamin D, Ergocalciferol, (DRISDOL) 50000 units CAPS capsule Take 50,000 Units by mouth every 7 (seven) days. Pt takes on Friday.    Marland Kitchen ZICONOTIDE ACETATE IT by Intrathecal route every 3 (three) months.     No current facility-administered medications for this visit.     Allergies:   Motrin [ibuprofen] and Penicillins    Social History:  The patient  reports that she has never smoked. She has never used smokeless tobacco. She reports that she drinks alcohol. She reports that she does not use drugs.   Family History:  The patient's family history includes Diabetes in her father and mother; Heart disease in her mother.    ROS:  Please see the history of present illness.   Otherwise, review of systems are positive for none.   All other systems are reviewed and negative.    PHYSICAL EXAM: VS:  BP 104/74   Pulse 71   Ht 5\' 6"  (1.676 m)   Wt 210 lb 6.4 oz (95.4 kg)   SpO2 98%   BMI 33.96 kg/m  , BMI Body mass index is 33.96 kg/m. GENERAL:  Well appearing, obese WF in NAD HEENT:  PERRL, EOMI, sclera are clear. Oropharynx is clear. NECK:  No jugular venous distention, carotid upstroke brisk and symmetric, no bruits, no thyromegaly or adenopathy LUNGS:  Clear to auscultation bilaterally CHEST:  Unremarkable HEART:  RRR,  PMI not displaced or sustained,S1 and S2 within normal limits, no S3, no S4: no clicks, no rubs, no murmurs ABD:  Soft, nontender. BS +, no masses or bruits. No hepatomegaly, no splenomegaly EXT:  2 + pulses throughout, no edema, no cyanosis no clubbing SKIN:  Warm and dry.  No rashes NEURO:  Alert and oriented x 3. Cranial nerves II through XII intact. PSYCH:  Cognitively intact     Recent Labs: No results found for requested labs within last 8760 hours.    Lipid Panel No results found for: CHOL, TRIG, HDL, CHOLHDL, VLDL, LDLCALC, LDLDIRECT    Wt Readings from Last 3 Encounters:  02/08/18 210 lb 6.4 oz (95.4 kg)  01/25/18 208 lb  (94.3 kg)  01/20/18 200 lb (90.7 kg)      Other studies Reviewed: Additional studies/ records that were reviewed today include: see H&P  Labs dated 05/09/17: potassium 3.3. Other CMET and CBC normal. Dated 12/21/16: cholesterol 234, triglycerides 342, HDL 35, LDL 131.    ASSESSMENT AND PLAN:  1.  Neurocardiogenic syncope/ POTS. Classic symptoms/ findings. Continue Florinef and midodrin. Liberalize sodium intake and avoid diuretics. Continue support hose/abdominal binder. Will follow up in 6 months. 2. OSA. Sleep study results pending. 3. Obesity 4. Prediabetes.  5. Wegener's granulomatosis.  6. Peripheral neuropathy.   Disposition:   FU with me in 6 months  Signed, Jamee Pacholski Swaziland, MD  02/08/2018 9:56 AM    [facility] [address], [place], [place], [address] Phone [phone], Fax [phone]

## 2018-02-08 ENCOUNTER — Encounter: Payer: Self-pay | Admitting: Cardiology

## 2018-02-08 ENCOUNTER — Ambulatory Visit (INDEPENDENT_AMBULATORY_CARE_PROVIDER_SITE_OTHER): Payer: BC Managed Care – PPO | Admitting: Cardiology

## 2018-02-08 VITALS — BP 104/74 | HR 71 | Ht 66.0 in | Wt 210.4 lb

## 2018-02-08 DIAGNOSIS — I951 Orthostatic hypotension: Secondary | ICD-10-CM

## 2018-02-08 DIAGNOSIS — R55 Syncope and collapse: Secondary | ICD-10-CM

## 2018-02-08 DIAGNOSIS — I498 Other specified cardiac arrhythmias: Secondary | ICD-10-CM

## 2018-02-08 DIAGNOSIS — R000 Tachycardia, unspecified: Secondary | ICD-10-CM | POA: Diagnosis not present

## 2018-02-08 DIAGNOSIS — G90A Postural orthostatic tachycardia syndrome (POTS): Secondary | ICD-10-CM

## 2018-02-08 NOTE — Patient Instructions (Addendum)
We will follow up on your sleep study  Continue your current medical therapy

## 2018-02-12 ENCOUNTER — Encounter (HOSPITAL_BASED_OUTPATIENT_CLINIC_OR_DEPARTMENT_OTHER): Payer: Self-pay | Admitting: Cardiovascular Disease

## 2018-02-12 NOTE — Procedures (Signed)
Patient Name: [name], [name] Study Date: 01/20/2018 Gender: Female D.O.B: [birth year] Age (years): 52 Referring Provider: Peter M SwazilandJordan Height (inches): 66 Interpreting Physician: Nicki Guadalajarahomas Kelly MD, ABSM Weight (lbs): 200 RPSGT: Lowry RamMckinney, Takeya BMI: 32 MRN: [id number] Neck Size: 14.50  CLINICAL INFORMATION Sleep Study Type: NPSG  Indication for sleep study: Obesity, OSA, Re-Evaluation, Snoring  Epworth Sleepiness Score: 16  SLEEP STUDY TECHNIQUE As per the AASM Manual for the Scoring of Sleep and Associated Events v2.3 (April 2016) with a hypopnea requiring 4% desaturations.  The channels recorded and monitored were frontal, central and occipital EEG, electrooculogram (EOG), submentalis EMG (chin), nasal and oral airflow, thoracic and abdominal wall motion, anterior tibialis EMG, snore microphone, electrocardiogram, and pulse oximetry.  MEDICATIONS     acetaminophen (TYLENOL) 325 MG tablet         albuterol (PROVENTIL HFA;VENTOLIN HFA) 108 (90 Base) MCG/ACT inhaler         b complex vitamins tablet         Calcium Carb-Cholecalciferol (CALCIUM CARBONATE-VITAMIN D3 PO)         clotrimazole (MYCELEX) 10 MG troche         conjugated estrogens (PREMARIN) vaginal cream         dextromethorphan-guaiFENesin (MUCINEX DM) 30-600 MG 12hr tablet         DULoxetine (CYMBALTA) 60 MG capsule         ferrous sulfate 325 (65 FE) MG tablet         fludrocortisone (FLORINEF) 0.1 MG tablet         fluticasone (FLONASE) 50 MCG/ACT nasal spray         folic acid (FOLVITE) 1 MG tablet         furosemide (LASIX) 40 MG tablet         gabapentin (NEURONTIN) 300 MG capsule         lidocaine (LIDODERM) 5 %         LINZESS 145 MCG CAPS capsule         methotrexate 50 MG/2ML injection         metroNIDAZOLE (FLAGYL) 500 MG tablet         midodrine (PROAMATINE) 10 MG tablet         montelukast (SINGULAIR) 10 MG tablet         Mouthwash Compounding Base (MOUTH WASH-GP) LIQD         mupirocin  ointment (BACTROBAN) 2 %         pantoprazole (PROTONIX) 40 MG tablet         predniSONE (DELTASONE) 5 MG tablet         PRESCRIPTION MEDICATION         sulfamethoxazole-trimethoprim (BACTRIM,SEPTRA) 400-80 MG tablet         tizanidine (ZANAFLEX) 2 MG capsule         topiramate (TOPAMAX) 100 MG tablet         traZODone (DESYREL) 50 MG tablet         valACYclovir (VALTREX) 500 MG tablet         vitamin B-12 (CYANOCOBALAMIN) 1000 MCG tablet         Vitamin D, Ergocalciferol, (DRISDOL) 50000 units CAPS capsule         ZICONOTIDE ACETATE IT      Medications self-administered by patient taken the night of the study : GABAPENTIN, TIZANIDINE, PANTOPRAZOLE SODIUM, MONTELUKAST, TOPIRAMATE, DULOXETINE  SLEEP ARCHITECTURE The study was initiated at 11:01:29 PM and ended at 5:42:24 AM.  Sleep onset time was 13.7 minutes and the sleep efficiency was 92.7%%. The total sleep time was 371.5 minutes.  Stage REM latency was 211.5 minutes.  The patient spent 0.7%% of the night in stage N1 sleep, 70.5%% in stage N2 sleep, 17.5%% in stage N3 and 11.31% in REM.  Alpha intrusion was absent.  Supine sleep was 0.00%.  RESPIRATORY PARAMETERS The overall apnea/hypopnea index (AHI) was 9.5 per hour. The respiratory disturbance index (RDI) was 10.8 per hour. There were 3 total apneas, including 3 obstructive, 0 central and 0 mixed apneas. There were 56 hypopneas and 8 RERAs.  The AHI during Stage REM sleep was 5.7 per hour.  AHI while supine was N/A per hour.  The mean oxygen saturation was 91.7%. The minimum SpO2 during sleep was 83.0%.  Moderate snoring was noted during this study.  CARDIAC DATA The 2 lead EKG demonstrated sinus rhythm. The mean heart rate was 60.1 beats per minute. Other EKG findings include: None  LEG MOVEMENT DATA The total PLMS were 0 with a resulting PLMS index of 0.0. Associated arousal with leg movement index was 1.6 .  IMPRESSIONS - Mild obstructive sleep apnea with an AHI  9.5/h; RDI 10.8/h wiith absence of supine sleep. - No significant central sleep apnea occurred during this study (CAI = 0.0/h). - Moderate oxygen desaturation to a nadir of 83%. - The patient snored with moderate snoring volume. - No cardiac abnormalities were noted during this study. - Clinically significant periodic limb movements did not occur during sleep. No significant associated arousals.  DIAGNOSIS - Obstructive Sleep Apnea (327.23 [G47.33 ICD-10]) - Nocturnal Hypoxemia (327.26 [G47.36 ICD-10])  RECOMMENDATIONS - In this patient with significant cardiovascular comorbidities recommend therapeutic CPAP titration to determine optimal pressure required to alleviate sleep disordered breathing. - Efforts should be made to optimize nasal and oropharyngeal partency. - Avoid alcohol, sedatives and other CNS depressants that may worsen sleep apnea and disrupt normal sleep architecture. - Sleep hygiene should be reviewed to assess factors that may improve sleep quality. - Weight management and regular exercise should be initiated or continued if appropriate.  [Electronically signed] 02/12/2018 07:49 PM  Nicki Guadalajara MD, [facility], ABSM Diplomate, American Board of Sleep Medicine   NPI: [id number] [place] [facility] PH: [phone]   FX: [phone] ACCREDITED BY THE AMERICAN ACADEMY OF SLEEP MEDICINE

## 2018-02-13 ENCOUNTER — Other Ambulatory Visit: Payer: Self-pay | Admitting: Cardiovascular Disease

## 2018-02-13 ENCOUNTER — Telehealth: Payer: Self-pay | Admitting: *Deleted

## 2018-02-13 DIAGNOSIS — G4733 Obstructive sleep apnea (adult) (pediatric): Secondary | ICD-10-CM

## 2018-02-13 NOTE — Telephone Encounter (Signed)
Patient informed of sleep study results and recommendations. She voiced verbal understanding of information given to her asking why it took so long for her to receive her results. I apologized to her and explained the reading MD is not just the sleep MD, but also has regular cardiology patient's as well. Sometimes it may take a little longer to get to the sleep studies. I again apologized to patient. She was advised of her 03/07/18 CPAP titration appointment.

## 2018-02-13 NOTE — Progress Notes (Signed)
02/13/18 patient notified of sleep results and recommendations. CPAP titration scheduled 03/07/18. Patient aware.

## 2018-03-07 ENCOUNTER — Ambulatory Visit (HOSPITAL_BASED_OUTPATIENT_CLINIC_OR_DEPARTMENT_OTHER): Payer: BC Managed Care – PPO | Attending: Cardiovascular Disease | Admitting: Cardiovascular Disease

## 2018-03-07 VITALS — Ht 66.0 in | Wt 210.0 lb

## 2018-03-07 DIAGNOSIS — G4733 Obstructive sleep apnea (adult) (pediatric): Secondary | ICD-10-CM | POA: Diagnosis not present

## 2018-03-21 ENCOUNTER — Encounter (HOSPITAL_BASED_OUTPATIENT_CLINIC_OR_DEPARTMENT_OTHER): Payer: Self-pay | Admitting: Cardiovascular Disease

## 2018-03-21 ENCOUNTER — Telehealth: Payer: Self-pay | Admitting: *Deleted

## 2018-03-21 NOTE — Progress Notes (Signed)
Patient notified. CPAP order sent to [facility].

## 2018-03-21 NOTE — Procedures (Signed)
Patient Name: [name], [name] Date: 03/07/2018 Gender: Female D.O.B: 06/30/1966 Age (years): 62 Referring Provider: Nicki Guadalajara MD, ABSM Height (inches): 66 Interpreting Physician: Nicki Guadalajara MD, ABSM Weight (lbs): 200 RPSGT: Shelah Lewandowsky BMI: 32 MRN: [id number] Neck Size: 14.50  CLINICAL INFORMATION The patient is referred for a CPAP titration to treat sleep apnea.  Date of NPSG: 01/20/2018:  AHI 9.5/h; RDI 10.8/h;  Oxygen desaturation to 83%.  SLEEP STUDY TECHNIQUE As per the AASM Manual for the Scoring of Sleep and Associated Events v2.3 (April 2016) with a hypopnea requiring 4% desaturations.  The channels recorded and monitored were frontal, central and occipital EEG, electrooculogram (EOG), submentalis EMG (chin), nasal and oral airflow, thoracic and abdominal wall motion, anterior tibialis EMG, snore microphone, electrocardiogram, and pulse oximetry. Continuous positive airway pressure (CPAP) was initiated at the beginning of the study and titrated to treat sleep-disordered breathing.  MEDICATIONS     acetaminophen (TYLENOL) 325 MG tablet         albuterol (PROVENTIL HFA;VENTOLIN HFA) 108 (90 Base) MCG/ACT inhaler         b complex vitamins tablet         Calcium Carb-Cholecalciferol (CALCIUM CARBONATE-VITAMIN D3 PO)         clotrimazole (MYCELEX) 10 MG troche         conjugated estrogens (PREMARIN) vaginal cream         dextromethorphan-guaiFENesin (MUCINEX DM) 30-600 MG 12hr tablet         DULoxetine (CYMBALTA) 60 MG capsule         ferrous sulfate 325 (65 FE) MG tablet         fludrocortisone (FLORINEF) 0.1 MG tablet         fluticasone (FLONASE) 50 MCG/ACT nasal spray         folic acid (FOLVITE) 1 MG tablet         furosemide (LASIX) 40 MG tablet         gabapentin (NEURONTIN) 300 MG capsule         lidocaine (LIDODERM) 5 %         LINZESS 145 MCG CAPS capsule         methotrexate 50 MG/2ML injection         metroNIDAZOLE (FLAGYL) 500 MG tablet          midodrine (PROAMATINE) 10 MG tablet         montelukast (SINGULAIR) 10 MG tablet         Mouthwash Compounding Base (MOUTH WASH-GP) LIQD         mupirocin ointment (BACTROBAN) 2 %         pantoprazole (PROTONIX) 40 MG tablet         predniSONE (DELTASONE) 5 MG tablet         PRESCRIPTION MEDICATION         sulfamethoxazole-trimethoprim (BACTRIM,SEPTRA) 400-80 MG tablet         tizanidine (ZANAFLEX) 2 MG capsule         topiramate (TOPAMAX) 100 MG tablet         traZODone (DESYREL) 50 MG tablet         valACYclovir (VALTREX) 500 MG tablet         vitamin B-12 (CYANOCOBALAMIN) 1000 MCG tablet         Vitamin D, Ergocalciferol, (DRISDOL) 50000 units CAPS capsule         ZICONOTIDE ACETATE IT      Medications self-administered by patient  taken the night of the study : GABAPENTIN, PANTOPRAZOLE SODIUM, MONTELUKAST, TOPIRAMATE, DULOXETINE, NEXIUM, TRAZODONE, MIDODRINE  TECHNICIAN COMMENTS Comments added by technician: NONE  RESPIRATORY PARAMETERS Optimal PAP Pressure (cm): 7 AHI at Optimal Pressure (/hr): 0.2 Overall Minimal O2 (%): 87.0 Supine % at Optimal Pressure (%): 29 Minimal O2 at Optimal Pressure (%): 88.0   SLEEP ARCHITECTURE The study was initiated at 10:43:34 PM and ended at 4:54:54 AM.  Sleep onset time was 0.0 minutes and the sleep efficiency was 92.6%%. The total sleep time was 344 minutes.  The patient spent 1.9%% of the night in stage N1 sleep, 82.1%% in stage N2 sleep, 0.0%% in stage N3 and 15.99% in REM.Stage REM latency was 229.5 minutes  Wake after sleep onset was 27.3. Alpha intrusion was absent. Supine sleep was 34.87%.  CARDIAC DATA The 2 lead EKG demonstrated sinus rhythm. The mean heart rate was 54.5 beats per minute. Other EKG findings include: None.  LEG MOVEMENT DATA The total Periodic Limb Movements of Sleep (PLMS) were 0. The PLMS index was 0.0. A PLMS index of <15 is considered normal in adults.  IMPRESSIONS - CPAP was initiated at 5 cm and  was titrated to only 7 cwp.  AHI at 7 cm was 0.2 with oxygen nadir to 88%.  - Central sleep apnea was not noted during this titration (CAI = 0.0/h). - Mild oxygen desaturations to a nadir of 87% at 5 cwp. - The patient snored with moderate snoring volume during this titration study. - No cardiac abnormalities were observed during this study. - Clinically significant periodic limb movements were not noted during this study. Arousals associated with PLMs were rare.  DIAGNOSIS - Obstructive Sleep Apnea (327.23 [G47.33 ICD-10])  RECOMMENDATIONS - Recommend an initial trial of CPAP therapy at 8 cm H2O with heated humidification.  A Medium size Philips Respironics Full Face Mask Dreamwear mask was used for the titration. - Avoid alcohol, sedatives and other CNS depressants that may worsen sleep apnea and disrupt normal sleep architecture. - Sleep hygiene should be reviewed to assess factors that may improve sleep quality. - Weight management and regular exercise should be initiated or continued. - Recommen a download be obtained in 30 days and sleep clinic evaluation after 4 weeks of therapy   [Electronically signed] 03/21/2018 12:26 PM  Nicki Guadalajara MD, [facility], ABSM Diplomate, American Board of Sleep Medicine   NPI: [id number] [facility] PH: [phone]   FX: [phone] ACCREDITED BY THE AMERICAN ACADEMY OF SLEEP MEDICINE

## 2018-03-21 NOTE — Telephone Encounter (Signed)
Called patient to inform her that I will be sending order to [facility] for her to get set up on on CPAP therapy. She wanted me to let Dr Swaziland know that her blood pressure is dropping down low again. Dropping as low as 74/54. She is symptomatic. Having dizziness both sitting and standing. I asked if she is keeping hydrated with water and she replied yes. Message will be sent to Dr Swaziland and Elnita Maxwell for review and recommendations.

## 2018-03-22 ENCOUNTER — Telehealth: Payer: Self-pay | Admitting: Cardiology

## 2018-03-22 NOTE — Telephone Encounter (Signed)
Called patient and LVM to call back to schedule an appointment with the pharmacist to discuss Northera.

## 2018-03-22 NOTE — Telephone Encounter (Signed)
I would like [name] to see our pharmacist to see if she will qualify and be able to afford Northera (droxidopa). She has failed florinef and midodrine.  Peter SwazilandJordan MD, [facility]

## 2018-03-22 NOTE — Telephone Encounter (Signed)
Spoke to patient Dr.Jordan's recommendation given.Advised scheduler will call back to schedule appointment with pharmacist.

## 2018-04-02 ENCOUNTER — Telehealth: Payer: Self-pay | Admitting: *Deleted

## 2018-04-02 NOTE — Telephone Encounter (Signed)
Faxed CMN to [facility] for CPAP supplies ordered 03/22/18.

## 2018-04-17 ENCOUNTER — Ambulatory Visit (INDEPENDENT_AMBULATORY_CARE_PROVIDER_SITE_OTHER): Payer: BC Managed Care – PPO | Admitting: Pharmacist Clinician (PhC)/ Clinical Pharmacy Specialist

## 2018-04-17 ENCOUNTER — Encounter: Payer: Self-pay | Admitting: Pharmacist Clinician (PhC)/ Clinical Pharmacy Specialist

## 2018-04-17 DIAGNOSIS — R55 Syncope and collapse: Secondary | ICD-10-CM

## 2018-04-17 NOTE — Patient Instructions (Signed)
Start Northera 100 mg capsules - 1 capsule three times daily for the first 3-4 days.  Then increase to 2 capsules three times daily.  (You can increase to 2, 1, 1 then 2, 2, 1 then 2, 2, 2).   Work your way up to 3 capsules three times per day.    Continue to check your blood pressure at home 1-2 times per day.     HOW TO TAKE YOUR BLOOD PRESSURE: . Rest 5 minutes before taking your blood pressure. .  Don't smoke or drink caffeinated beverages for at least 30 minutes before. . Take your blood pressure before (not after) you eat. . Sit comfortably with your back supported and both feet on the floor (don't cross your legs). . Elevate your arm to heart level on a table or a desk. . Use the proper sized cuff. It should fit smoothly and snugly around your bare upper arm. There should be enough room to slip a fingertip under the cuff. The bottom edge of the cuff should be 1 inch above the crease of the elbow. . Ideally, take 3 measurements at one sitting and record the average.

## 2018-04-17 NOTE — Progress Notes (Signed)
04/18/2018 [name] 04-09-1966 [id number]   HPI:  Stacey CromerLisa [name] is a 52 y.o. female patient of Dr SwazilandJordan, with a PMH below who presents today for hypotension clinic evaluation.  Patient has a medical history significant for hyperlipidemia, POTS, neurocardiogenic syncope, CKD (stage 3), Wegner's disease, chemotherapy induced peripheral neuropathy, impaired glucose tolerance and obesity.    Patient reports history of hypotension for many years, with symptoms of dizziness, fatigue, lethargy and inability to focus or get thoughts out.  She has had multiple falls, including one last year that caused her to break her foot/ankle in two locations.  She notices the hypotension is more frequent in the mornings, although it does happen at any time of the day.   Currently she has an infusion pump with ziconitide.  This can have the potential to cause orthostatic hypotension and dizziness, however she has been using this for about a year and her symptoms have been ongoing for several years prior to initiation of the pump.   At this point it would not be feasible to stop the pump to see if symptoms improve, as it would negatively affect her quality of life.    Blood Pressure Goal:  130/80  Current Medications:  Fludrocortisone 0.2 mg qd - am  Midodrine 10 mg tid - 7am, 12:30-1pm, 10 pm   Social Hx:  No tobacco, occasional mixed drink; 2-3 cans of Dr. Reino KentPepper per day (12 oz cans)  Diet:  Eats plenty of salt to help with BP, causes her to retain fluid.  Has fluid pill for when retains too much  Exercise:  Unable to do much walking or physical activity due to peripheral neuropathy  Home BP readings:  Did not bring any home readings, but does recall her lowest reading of the past week was 66/42 high was 117 systolic  Labs:  4/09812/2019:  Na 140, K 3.8, Glu 115, BUN 8, SCr 1.02  Wt Readings from Last 3 Encounters:  03/07/18 210 lb (95.3 kg)  02/08/18 210 lb 6.4 oz (95.4 kg)  01/25/18 208 lb (94.3  kg)   BP Readings from Last 3 Encounters:  04/17/18 (!) 144/88  02/08/18 104/74  01/25/18 107/70   Pulse Readings from Last 3 Encounters:  04/17/18 (!) 58  02/08/18 71  01/25/18 73    Current Outpatient Medications  Medication Sig Dispense Refill  . acetaminophen (TYLENOL) 325 MG tablet Take 2 tablets (650 mg total) by mouth every 6 (six) hours as needed for mild pain (or Fever >/= 101).    Marland Kitchen. albuterol (PROVENTIL HFA;VENTOLIN HFA) 108 (90 Base) MCG/ACT inhaler Inhale 2 puffs into the lungs every 6 (six) hours as needed for wheezing or shortness of breath.    Marland Kitchen. b complex vitamins tablet Take 1 tablet by mouth daily.    . Calcium Carb-Cholecalciferol (CALCIUM CARBONATE-VITAMIN D3 PO) Take 1 tablet by mouth daily.    . clotrimazole (MYCELEX) 10 MG troche Take 10 mg by mouth daily as needed.    . conjugated estrogens (PREMARIN) vaginal cream Place 1 Applicatorful vaginally 2 (two) times a week.    Marland Kitchen. dextromethorphan-guaiFENesin (MUCINEX DM) 30-600 MG 12hr tablet Take 1 tablet by mouth 2 (two) times daily as needed for cough.    . DULoxetine (CYMBALTA) 60 MG capsule Take 60 mg by mouth 2 (two) times daily.    . ferrous sulfate 325 (65 FE) MG tablet Take 325 mg by mouth daily with breakfast.    . fludrocortisone (FLORINEF) 0.1 MG tablet  Take 2 tablets (0.2 mg total) by mouth daily. 30 tablet 11  . fluticasone (FLONASE) 50 MCG/ACT nasal spray Place 2 sprays into both nostrils daily as needed for rhinitis.    . folic acid (FOLVITE) 1 MG tablet Take 1 mg by mouth daily.    . furosemide (LASIX) 40 MG tablet Take 40 mg by mouth as directed.     . gabapentin (NEURONTIN) 300 MG capsule Take 1,200-1,600 mg by mouth 2 (two) times daily. Pt takes three capsules in the morning and four at night.    . lidocaine (LIDODERM) 5 % Place 1 patch onto the skin daily as needed (for pain). Remove & Discard patch within 12 hours or as directed by MD    . Karlene Einstein 145 MCG CAPS capsule Take 145 mcg by mouth daily.  5   . methotrexate 50 MG/2ML injection Inject 1 mL into the skin.    Marland Kitchen metroNIDAZOLE (FLAGYL) 500 MG tablet 500 mg as directed.   0  . midodrine (PROAMATINE) 10 MG tablet Take 1 tablet (10 mg total) by mouth 3 (three) times daily. 90 tablet 6  . montelukast (SINGULAIR) 10 MG tablet Take 10 mg by mouth daily.  3  . Mouthwash Compounding Base (MOUTH WASH-GP) LIQD Take by mouth. Lidocaine-Diphenhydramine-Aluminum-Magnesium-Simethicone    . mupirocin ointment (BACTROBAN) 2 % Place 1 application into the nose daily as needed.    . pantoprazole (PROTONIX) 40 MG tablet Take 40 mg by mouth daily.    . predniSONE (DELTASONE) 5 MG tablet Take 5 mg by mouth daily with breakfast.    . PRESCRIPTION MEDICATION by Intrathecal route as directed. ziconotide (PF) 25 mcg/mL, BUPivacaine (PF) 18 mg/mL 20 mL intrathecal pump refill    . sulfamethoxazole-trimethoprim (BACTRIM,SEPTRA) 400-80 MG tablet TAKE 1 TABLET 3 TIMES A WEEK  3  . tizanidine (ZANAFLEX) 2 MG capsule TAKE ONE CAPSULE BY MOUTH 3 TIMES A DAY AS NEEDED FOR MUSCLE SPASMS    . topiramate (TOPAMAX) 100 MG tablet Take 100 mg by mouth daily.    . traZODone (DESYREL) 50 MG tablet Take 50 mg by mouth at bedtime.    . valACYclovir (VALTREX) 500 MG tablet Take 1,000 mg by mouth 2 (two) times daily as needed.    . vitamin B-12 (CYANOCOBALAMIN) 1000 MCG tablet Take 1,000 mcg by mouth daily.    . Vitamin D, Ergocalciferol, (DRISDOL) 50000 units CAPS capsule Take 50,000 Units by mouth every 7 (seven) days. Pt takes on Friday.    Marland Kitchen ZICONOTIDE ACETATE IT by Intrathecal route every 3 (three) months.     No current facility-administered medications for this visit.     Allergies  Allergen Reactions  . Motrin [Ibuprofen] Other (See Comments)    Pt is unable to take this due to kidney problems.    . Penicillins Rash and Other (See Comments)    Has patient had a PCN reaction causing immediate rash, facial/tongue/throat swelling, SOB or lightheadedness with hypotension:  No Has patient had a PCN reaction causing severe rash involving mucus membranes or skin necrosis: No Has patient had a PCN reaction that required hospitalization No Has patient had a PCN reaction occurring within the last 10 years: No If all of the above answers are "NO", then may proceed with Cephalosporin use.    Past Medical History:  Diagnosis Date  . Chronic cough   . Diabetes mellitus without complication (HCC)   . Hypertension   . OSA (obstructive sleep apnea)   . Peripheral neuropathy   .  Wegener's granulomatosis (HCC) 2009  . Wegner's disease (congenital syphilitic osteochondritis)     Blood pressure (!) 144/88, pulse (!) 58.  Standing 112/72  Neurocardiogenic syncope Patient with neurocardiogenic syncope, POTS and peripheral neuropathy.  Despite being on fludricortisone 0.2 mg daily and midodrine 10 mg tid she continues to have orthostatic problems.  We are going to start her on Northera 100 mg tid and increase the dose every 3-4 days until she is at 300 mg tid.  At that time we can evaluate how she is doing and consider tapering down the fludrocortisone, as she has problems retaining fluid.     Phillips Hay PharmD CPP [facility] [address] [place], [place] [address] [phone]

## 2018-04-18 NOTE — Assessment & Plan Note (Signed)
Patient with neurocardiogenic syncope, POTS and peripheral neuropathy.  Despite being on fludricortisone 0.2 mg daily and midodrine 10 mg tid she continues to have orthostatic problems.  We are going to start her on Northera 100 mg tid and increase the dose every 3-4 days until she is at 300 mg tid.  At that time we can evaluate how she is doing and consider tapering down the fludrocortisone, as she has problems retaining fluid.

## 2018-04-23 ENCOUNTER — Other Ambulatory Visit: Payer: Self-pay | Admitting: Pharmacist Clinician (PhC)/ Clinical Pharmacy Specialist

## 2018-04-23 MED ORDER — DROXIDOPA 100 MG PO CAPS: ORAL_CAPSULE | ORAL | 0 refills | Status: DC

## 2018-04-26 ENCOUNTER — Telehealth: Payer: Self-pay | Admitting: Pharmacist Clinician (PhC)/ Clinical Pharmacy Specialist

## 2018-04-26 NOTE — Telephone Encounter (Signed)
Husband called.  Patient doing well with Northera, however has noticed muscle aches/pains in upper thighs over past week.  To the point where she cannot pull herself up without assistance.  Has to pull with her arms more and therefore they are becoming sore as well.    Advised husband to taper off Northera and keep appointment for Thursday.  If muscle problems resolve then we can re-challenge.  Husband voiced understanding

## 2018-05-02 ENCOUNTER — Ambulatory Visit (INDEPENDENT_AMBULATORY_CARE_PROVIDER_SITE_OTHER): Payer: BC Managed Care – PPO | Admitting: Pharmacist Clinician (PhC)/ Clinical Pharmacy Specialist

## 2018-05-02 ENCOUNTER — Encounter: Payer: Self-pay | Admitting: Pharmacist Clinician (PhC)/ Clinical Pharmacy Specialist

## 2018-05-02 DIAGNOSIS — R531 Weakness: Secondary | ICD-10-CM | POA: Insufficient documentation

## 2018-05-02 DIAGNOSIS — R55 Syncope and collapse: Secondary | ICD-10-CM

## 2018-05-02 NOTE — Patient Instructions (Signed)
Patient to call once she determines cause of muscle weakness/paralysis

## 2018-05-02 NOTE — Assessment & Plan Note (Signed)
Patient had done extremely well with the Northera, titrating up to 300 mg tid.  Unfortunately the muscular problem has caused us to put it temporarily on hold.   I stressed the need for her to [facility] and get in for evaluation as soon as possible.   Once she has a better picture as to that problem, she will call and we can re-challenge her with the Northera.  In the meantime I will contact the medical liaison for Northera to see if they have had any similar reports in the past.

## 2018-05-02 NOTE — Progress Notes (Signed)
05/02/2018 [name] [birth year] [id number]   HPI:  [name] is a 52 y.o. female patient of Dr Swaziland, with a PMH below who presents today for hypotension clinic evaluation.  Patient has a medical history significant for hyperlipidemia, POTS, neurocardiogenic syncope, CKD (stage 3), Wegner's disease, chemotherapy induced peripheral neuropathy, impaired glucose tolerance and obesity.    Patient reports history of hypotension for many years, with symptoms of dizziness, fatigue, lethargy and inability to focus or get thoughts out.  She has had multiple falls, including one last year that caused her to break her foot/ankle in two locations.  She notices the hypotension is more frequent in the mornings, although it does happen at any time of the day.   Currently she has an infusion pump with ziconitide.  This can have the potential to cause orthostatic hypotension and dizziness, however she has been using this for about a year and her symptoms have been ongoing for several years prior to initiation of the pump.   At this point it would not be feasible to stop the pump to see if symptoms improve, as it would negatively affect her quality of life.    When I saw her last month I started her on Northera and had her titrate the dose over about 10 days to 300 mg tid.  She reported doing well with it.  She felt better, had less dizziness and more energy.  Her home BP reading were in the 120 range regularly and she hIad less orthostasis.   Unfortunately after 5-6 days on the Northera she woke up with severe weakness in her upper legs.  It worsened over several days to the point where she felt that she could not support herself or even raise her knees when seated.  She went to her PCP who suggested she follow up with neurology.   She called here on the same day and we told her to stop the Northera.  This is not a side effect that is listed in the literature for Northera, but felt the need to stop medication  to be sure.    After that day patient reported that symptoms started to improve and after several days was feeling back to her baseline.   Then the symptoms reappeared yesterday afternoon.  She has not had any Northera in 4-5 days.    Blood Pressure Goal:  130/80  Current Medications:  Fludrocortisone 0.2 mg qd - am  Midodrine 10 mg tid - 7am, 12:30-1pm, 10 pm   Social Hx:  No tobacco, occasional mixed drink; 2-3 cans of Dr. Reino Kent per day (12 oz cans)  Diet:  Eats plenty of salt to help with BP, causes her to retain fluid.  Has fluid pill for when retains too much  Exercise:  Unable to do much walking or physical activity due to peripheral neuropathy  Home BP readings:  Did not bring other than this am: 117/84, 96/64/ 90/68  (supine, sitting, standing)  Labs:  11/2017:  Na 140, K 3.8, Glu 115, BUN 8, SCr 1.02  Wt Readings from Last 3 Encounters:  03/07/18 210 lb (95.3 kg)  02/08/18 210 lb 6.4 oz (95.4 kg)  01/25/18 208 lb (94.3 kg)   BP Readings from Last 3 Encounters:  05/02/18 98/66  04/17/18 (!) 144/88  02/08/18 104/74   Pulse Readings from Last 3 Encounters:  05/02/18 80  04/17/18 (!) 58  02/08/18 71    Current Outpatient Medications  Medication Sig Dispense Refill  .  acetaminophen (TYLENOL) 325 MG tablet Take 2 tablets (650 mg total) by mouth every 6 (six) hours as needed for mild pain (or Fever >/= 101).    Marland Kitchen albuterol (PROVENTIL HFA;VENTOLIN HFA) 108 (90 Base) MCG/ACT inhaler Inhale 2 puffs into the lungs every 6 (six) hours as needed for wheezing or shortness of breath.    Marland Kitchen b complex vitamins tablet Take 1 tablet by mouth daily.    . Calcium Carb-Cholecalciferol (CALCIUM CARBONATE-VITAMIN D3 PO) Take 1 tablet by mouth daily.    . clotrimazole (MYCELEX) 10 MG troche Take 10 mg by mouth daily as needed.    . conjugated estrogens (PREMARIN) vaginal cream Place 1 Applicatorful vaginally 2 (two) times a week.    Marland Kitchen dextromethorphan-guaiFENesin (MUCINEX DM) 30-600  MG 12hr tablet Take 1 tablet by mouth 2 (two) times daily as needed for cough.    . Droxidopa 100 MG CAPS Start with 1 capsule three times daily, increase to 2 capsules three times daily after 4 days, the 3 capsules three times daily after 4 more days 240 capsule 0  . DULoxetine (CYMBALTA) 60 MG capsule Take 60 mg by mouth 2 (two) times daily.    . ferrous sulfate 325 (65 FE) MG tablet Take 325 mg by mouth daily with breakfast.    . fludrocortisone (FLORINEF) 0.1 MG tablet Take 2 tablets (0.2 mg total) by mouth daily. 30 tablet 11  . fluticasone (FLONASE) 50 MCG/ACT nasal spray Place 2 sprays into both nostrils daily as needed for rhinitis.    . folic acid (FOLVITE) 1 MG tablet Take 1 mg by mouth daily.    . furosemide (LASIX) 40 MG tablet Take 40 mg by mouth as directed.     . gabapentin (NEURONTIN) 300 MG capsule Take 1,200-1,600 mg by mouth 2 (two) times daily. Pt takes three capsules in the morning and four at night.    . lidocaine (LIDODERM) 5 % Place 1 patch onto the skin daily as needed (for pain). Remove & Discard patch within 12 hours or as directed by MD    . Karlene Einstein 145 MCG CAPS capsule Take 145 mcg by mouth daily.  5  . methotrexate 50 MG/2ML injection Inject 1 mL into the skin.    Marland Kitchen metroNIDAZOLE (FLAGYL) 500 MG tablet 500 mg as directed.   0  . midodrine (PROAMATINE) 10 MG tablet Take 1 tablet (10 mg total) by mouth 3 (three) times daily. 90 tablet 6  . montelukast (SINGULAIR) 10 MG tablet Take 10 mg by mouth daily.  3  . Mouthwash Compounding Base (MOUTH WASH-GP) LIQD Take by mouth. Lidocaine-Diphenhydramine-Aluminum-Magnesium-Simethicone    . mupirocin ointment (BACTROBAN) 2 % Place 1 application into the nose daily as needed.    . pantoprazole (PROTONIX) 40 MG tablet Take 40 mg by mouth daily.    . predniSONE (DELTASONE) 5 MG tablet Take 5 mg by mouth daily with breakfast.    . PRESCRIPTION MEDICATION by Intrathecal route as directed. ziconotide (PF) 25 mcg/mL, BUPivacaine (PF) 18  mg/mL 20 mL intrathecal pump refill    . sulfamethoxazole-trimethoprim (BACTRIM,SEPTRA) 400-80 MG tablet TAKE 1 TABLET 3 TIMES A WEEK  3  . tizanidine (ZANAFLEX) 2 MG capsule TAKE ONE CAPSULE BY MOUTH 3 TIMES A DAY AS NEEDED FOR MUSCLE SPASMS    . topiramate (TOPAMAX) 100 MG tablet Take 100 mg by mouth daily.    . traZODone (DESYREL) 50 MG tablet Take 50 mg by mouth at bedtime.    . valACYclovir (VALTREX) 500 MG  tablet Take 1,000 mg by mouth 2 (two) times daily as needed.    . vitamin B-12 (CYANOCOBALAMIN) 1000 MCG tablet Take 1,000 mcg by mouth daily.    . Vitamin D, Ergocalciferol, (DRISDOL) 50000 units CAPS capsule Take 50,000 Units by mouth every 7 (seven) days. Pt takes on Friday.    Marland Kitchen. ZICONOTIDE ACETATE IT by Intrathecal route every 3 (three) months.     No current facility-administered medications for this visit.     Allergies  Allergen Reactions  . Motrin [Ibuprofen] Other (See Comments)    Pt is unable to take this due to kidney problems.    . Penicillins Rash and Other (See Comments)    Has patient had a PCN reaction causing immediate rash, facial/tongue/throat swelling, SOB or lightheadedness with hypotension: No Has patient had a PCN reaction causing severe rash involving mucus membranes or skin necrosis: No Has patient had a PCN reaction that required hospitalization No Has patient had a PCN reaction occurring within the last 10 years: No If all of the above answers are "NO", then may proceed with Cephalosporin use.    Past Medical History:  Diagnosis Date  . Chronic cough   . Diabetes mellitus without complication (HCC)   . Hypertension   . OSA (obstructive sleep apnea)   . Peripheral neuropathy   . Wegener's granulomatosis (HCC) 2009  . Wegner's disease (congenital syphilitic osteochondritis)     Blood pressure 98/66, pulse 80.    Neurocardiogenic syncope Patient had done extremely well with the Northera, titrating up to 300 mg tid.  Unfortunately the muscular  problem has caused us to put it temporarily on hold.   I stressed the need for her to [facility] and get in for evaluation as soon as possible.   Once she has a better picture as to that problem, she will call and we can re-challenge her with the Northera.  In the meantime I will contact the medical liaison for Northera to see if they have had any similar reports in the past.     Phillips HayKristin Alvstad PharmD CPP [facility] [address] [place], [place] [address] [phone]

## 2018-05-11 DIAGNOSIS — R739 Hyperglycemia, unspecified: Secondary | ICD-10-CM | POA: Insufficient documentation

## 2018-05-11 DIAGNOSIS — T380X5A Adverse effect of glucocorticoids and synthetic analogues, initial encounter: Secondary | ICD-10-CM | POA: Insufficient documentation

## 2018-05-13 ENCOUNTER — Telehealth: Payer: Self-pay | Admitting: Pharmacist Clinician (PhC)/ Clinical Pharmacy Specialist

## 2018-05-13 NOTE — Telephone Encounter (Signed)
Patient husband called, states that [facility] and she was hospitalized and treated for her weak leg issues.  Husband states she was given high dose steroids and they were happy with the physicians desire to work with the various specialties to keep her healthy.   They were told to have her contact us regarding re-starting the Northera.  Still have some of sample bottle left.    Advised they re-start today and take 1 capsule tid x 3 days, then 2 capsules tid x 3 days then 3 capsules tid.  [facility]ed to [facility].  Husband aware to call with any copay concerns.  Once we get her back on medication, will follow up with [facility] appointment

## 2018-05-31 DIAGNOSIS — I7782 Antineutrophilic cytoplasmic antibody (ANCA) vasculitis: Secondary | ICD-10-CM | POA: Insufficient documentation

## 2018-05-31 DIAGNOSIS — I776 Arteritis, unspecified: Secondary | ICD-10-CM | POA: Insufficient documentation

## 2018-06-03 ENCOUNTER — Other Ambulatory Visit: Payer: Self-pay | Admitting: Cardiology

## 2018-06-06 ENCOUNTER — Other Ambulatory Visit: Payer: Self-pay | Admitting: Cardiology

## 2018-06-06 NOTE — Telephone Encounter (Signed)
Rx request sent to pharmacy.  

## 2018-06-18 ENCOUNTER — Other Ambulatory Visit: Payer: Self-pay | Admitting: Pharmacist

## 2018-06-18 MED ORDER — DROXIDOPA 100 MG PO CAPS: ORAL_CAPSULE | ORAL | 0 refills | Status: DC

## 2018-06-18 NOTE — Telephone Encounter (Signed)
Verbal Rx given today ([facility]).  Northera 100mg  capsules - Take 2-3 capsules TID

## 2018-06-22 ENCOUNTER — Other Ambulatory Visit: Payer: Self-pay | Admitting: Cardiology

## 2018-06-25 ENCOUNTER — Telehealth: Payer: Self-pay | Admitting: Pharmacist Clinician (PhC)/ Clinical Pharmacy Specialist

## 2018-06-25 MED ORDER — DROXIDOPA 300 MG PO CAPS: 300.0000 mg | ORAL_CAPSULE | Freq: Three times a day (TID) | ORAL | 3 refills | Status: DC

## 2018-06-25 NOTE — Telephone Encounter (Signed)
Spoke with patient.  She is currently on Northera 300 mg tid and reports doing well.  No regular dizziness or elevated blood pressure.  Would like to continue at this dose, does not feel the need to increase.  Would like to switch to 300 mg capsule.

## 2018-07-18 ENCOUNTER — Other Ambulatory Visit: Payer: Self-pay | Admitting: Nurse Practitioner

## 2018-07-18 DIAGNOSIS — Z1231 Encounter for screening mammogram for malignant neoplasm of breast: Secondary | ICD-10-CM

## 2018-07-23 ENCOUNTER — Telehealth: Payer: Self-pay | Admitting: Cardiology

## 2018-07-23 NOTE — Telephone Encounter (Signed)
Patient added to schedule for Dr Swaziland 07/24/18

## 2018-07-23 NOTE — Telephone Encounter (Signed)
Patient added to Dr Swaziland schedule for 07/24/18

## 2018-07-24 ENCOUNTER — Ambulatory Visit (INDEPENDENT_AMBULATORY_CARE_PROVIDER_SITE_OTHER): Payer: BC Managed Care – PPO | Admitting: Cardiology

## 2018-07-24 ENCOUNTER — Encounter: Payer: Self-pay | Admitting: Cardiology

## 2018-07-24 VITALS — BP 115/68 | HR 74 | Ht 66.0 in | Wt 215.8 lb

## 2018-07-24 DIAGNOSIS — R000 Tachycardia, unspecified: Secondary | ICD-10-CM

## 2018-07-24 DIAGNOSIS — I498 Other specified cardiac arrhythmias: Secondary | ICD-10-CM

## 2018-07-24 DIAGNOSIS — G4733 Obstructive sleep apnea (adult) (pediatric): Secondary | ICD-10-CM

## 2018-07-24 DIAGNOSIS — R55 Syncope and collapse: Secondary | ICD-10-CM | POA: Diagnosis not present

## 2018-07-24 DIAGNOSIS — G90A Postural orthostatic tachycardia syndrome (POTS): Secondary | ICD-10-CM

## 2018-07-24 DIAGNOSIS — I951 Orthostatic hypotension: Secondary | ICD-10-CM | POA: Diagnosis not present

## 2018-07-24 NOTE — Patient Instructions (Addendum)
Continue your current therapy  I will see you in 4 months  

## 2018-07-24 NOTE — Progress Notes (Signed)
Cardiology Office Note   Date:  07/24/2018   ID:  Cheryln Balcom, DOB 14-Mar-1966, MRN [id number]  PCP:  Elizabeth Palau, [name]  Cardiologist:   Dot Splinter Sw[place]il[place], MD   Chief Complaint  Patient presents with  . Dizziness      History of Present Illness: [name] is a 52 y.o. female who is seen for follow up of autonomic dysfunction/POTS. She has a history of DM, HTN, OSA, and Wegener's granulomatosis. She also has neuropathy with chronic pain. Prior cardiac work up includes a normal Echo in 2012 at the [place] [facility]. She also had a tilt table test in October 2014 and has been treated for Neurocardiogenic syncope with chronic Florinef. In 2015 she had a stress Echo at [facility] which was normal albeit with poor functional status.   She does have a history of sleep apnea but hasn't used CPAP. States she could not tolerate mask on her nose due to damage from [facility] but this was before she has reconstructive surgery. She is s/p chemotherapy for Wegeners and is on chronic steroids. No palpitations. Denies history but BS show she is prediabetic.   She was placed on Florinef with persistent symptoms. She was later added on midodrin. Symptoms persisted and she was seen in consult by Dr. Sharrell Ku on 01/25/18. It was felt that she had classic Neurocardiogenic syncope symptoms with autonomic dysfunction. Continue medical and lifestyle modification recommended. She did have a sleep study demonstrating OSA and CPAP therapy recommended.  Since her last visit she was started on Northera for her autonomic dysfunction/POTS. Followed by our Pharm D. In July she experienced severe leg fatigue and weakness. This was not felt to be related to [facility] but this was held until her symptoms could be evaluated. She was hospitalized at [facility]. Results of her evaluation there reported by the patient was that this was related to a flair of her Wegeners disease. She was treated with high dose steroids and later  had 2 rounds of chemo with Rituxan.  Later Northera resumed and now on 300 mg tid. Steroids have been weaned down.  She also developed multiple skin lesions that were oozing. Cultures positive for staph infection and she is now on antibiotics.   She does feel her orthostatic symptoms are better on Northera. Still on Florinef and midodrine. Hasn't been able to use CPAP regularly since the mask makes her nose hurt (with Wegener's). Tends to fall asleep easily.     Past Medical History:  Diagnosis Date  . Chronic cough   . Diabetes mellitus without complication (HCC)   . Hypertension   . OSA (obstructive sleep apnea)   . Peripheral neuropathy   . Wegener's granulomatosis (HCC) 2009  . Wegner's disease (congenital syphilitic osteochondritis)     Past Surgical History:  Procedure Laterality Date  . APPENDECTOMY    . BACK SURGERY    . CHOLECYSTECTOMY    . COLONOSCOPY N/A 06/02/2013   Procedure: COLONOSCOPY;  Surgeon: Graylin Shiver, MD;  Location: [facility] ENDOSCOPY;  Service: Endoscopy;  Laterality: N/A;  . HERNIA REPAIR  2000  . INCISION AND DRAINAGE ABSCESS Left 07/07/2016   Procedure: DEBRIDMENT LEFT THIGH ABSCESS, EXISION ACUTE SKIN RASH LEFT THIGH(1CM LESION);  Surgeon: Claud Kelp, MD;  Location: [facility] ORS;  Service: General;  Laterality: Left;  . SEPTOPLASTY  02/2013  . spleen anuyism    . splenic aneurysm    . VAGINAL HYSTERECTOMY       Current Outpatient Medications  Medication  Sig Dispense Refill  . acetaminophen (TYLENOL) 325 MG tablet Take 2 tablets (650 mg total) by mouth every 6 (six) hours as needed for mild pain (or Fever >/= 101).    Marland Kitchen albuterol (PROVENTIL HFA;VENTOLIN HFA) 108 (90 Base) MCG/ACT inhaler Inhale 2 puffs into the lungs every 6 (six) hours as needed for wheezing or shortness of breath.    Marland Kitchen b complex vitamins tablet Take 1 tablet by mouth daily.    . Calcium Carb-Cholecalciferol (CALCIUM CARBONATE-VITAMIN D3 PO) Take 1 tablet by mouth daily.    . clotrimazole  (MYCELEX) 10 MG troche Take 10 mg by mouth daily as needed.    . conjugated estrogens (PREMARIN) vaginal cream Place 1 Applicatorful vaginally 2 (two) times a week.    Marland Kitchen dextromethorphan-guaiFENesin (MUCINEX DM) 30-600 MG 12hr tablet Take 1 tablet by mouth 2 (two) times daily as needed for cough.    . Droxidopa 300 MG CAPS Take 300 mg by mouth 3 (three) times daily. 270 capsule 3  . DULoxetine (CYMBALTA) 60 MG capsule Take 60 mg by mouth 2 (two) times daily.    . ferrous sulfate 325 (65 FE) MG tablet Take 325 mg by mouth daily with breakfast.    . fludrocortisone (FLORINEF) 0.1 MG tablet TAKE 2 TABLETS (0.2 MG TOTAL) BY MOUTH DAILY. 90 tablet 3  . fluticasone (FLONASE) 50 MCG/ACT nasal spray Place 2 sprays into both nostrils daily as needed for rhinitis.    . folic acid (FOLVITE) 1 MG tablet Take 1 mg by mouth daily.    . furosemide (LASIX) 40 MG tablet Take 40 mg by mouth as directed.     . gabapentin (NEURONTIN) 300 MG capsule Take 1,200-1,600 mg by mouth 2 (two) times daily. Pt takes three capsules in the morning and four at night.    . lidocaine (LIDODERM) 5 % Place 1 patch onto the skin daily as needed (for pain). Remove & Discard patch within 12 hours or as directed by MD    . Karlene Einstein 145 MCG CAPS capsule Take 145 mcg by mouth daily.  5  . methotrexate 50 MG/2ML injection Inject 1 mL into the skin.    Marland Kitchen metroNIDAZOLE (FLAGYL) 500 MG tablet 500 mg as directed.   0  . midodrine (PROAMATINE) 10 MG tablet Take 1 tablet (10 mg total) by mouth 3 (three) times daily. 90 tablet 6  . midodrine (PROAMATINE) 5 MG tablet TAKE 1 TABLET (5 MG TOTAL) BY MOUTH 3 (THREE) TIMES DAILY WITH MEALS. 270 tablet 2  . montelukast (SINGULAIR) 10 MG tablet Take 10 mg by mouth daily.  3  . Mouthwash Compounding Base (MOUTH WASH-GP) LIQD Take by mouth. Lidocaine-Diphenhydramine-Aluminum-Magnesium-Simethicone    . mupirocin ointment (BACTROBAN) 2 % Place 1 application into the nose daily as needed.    . pantoprazole  (PROTONIX) 40 MG tablet Take 40 mg by mouth daily.    . predniSONE (DELTASONE) 5 MG tablet Take 5 mg by mouth daily with breakfast.    . PRESCRIPTION MEDICATION by Intrathecal route as directed. ziconotide (PF) 25 mcg/mL, BUPivacaine (PF) 18 mg/mL 20 mL intrathecal pump refill    . sulfamethoxazole-trimethoprim (BACTRIM,SEPTRA) 400-80 MG tablet TAKE 1 TABLET 3 TIMES A WEEK  3  . tizanidine (ZANAFLEX) 2 MG capsule TAKE ONE CAPSULE BY MOUTH 3 TIMES A DAY AS NEEDED FOR MUSCLE SPASMS    . topiramate (TOPAMAX) 100 MG tablet Take 100 mg by mouth daily.    . traZODone (DESYREL) 50 MG tablet Take 50 mg by  mouth at bedtime.    . valACYclovir (VALTREX) 500 MG tablet Take 1,000 mg by mouth 2 (two) times daily as needed.    . vitamin B-12 (CYANOCOBALAMIN) 1000 MCG tablet Take 1,000 mcg by mouth daily.    . Vitamin D, Ergocalciferol, (DRISDOL) 50000 units CAPS capsule Take 50,000 Units by mouth every 7 (seven) days. Pt takes on Friday.    Marland Kitchen ZICONOTIDE ACETATE IT by Intrathecal route every 3 (three) months.     No current facility-administered medications for this visit.     Allergies:   Tapentadol; Ibuprofen; and Penicillins    Social History:  The patient  reports that she has never smoked. She has never used smokeless tobacco. She reports that she drinks alcohol. She reports that she does not use drugs.   Family History:  The patient's family history includes Diabetes in her father and mother; Heart disease in her mother.    ROS:  Please see the history of present illness.   Otherwise, review of systems are positive for none.   All other systems are reviewed and negative.    PHYSICAL EXAM: VS:  BP 115/68   Pulse 74   Ht 5\' 6"  (1.676 m)   Wt 215 lb 12.8 oz (97.9 kg)   BMI 34.83 kg/m  , BMI Body mass index is 34.83 kg/m. GENERAL:  Well appearing, obese WF in NAD HEENT:  PERRL, EOMI, sclera are clear. Oropharynx is clear. NECK:  No jugular venous distention, carotid upstroke brisk and  symmetric, no bruits, no thyromegaly or adenopathy LUNGS:  Clear to auscultation bilaterally CHEST:  Unremarkable HEART:  RRR,  PMI not displaced or sustained,S1 and S2 within normal limits, no S3, no S4: no clicks, no rubs, no murmurs ABD:  Soft, nontender. BS +, no masses or bruits. No hepatomegaly, no splenomegaly EXT:  2 + pulses throughout, no edema, no cyanosis no clubbing SKIN:  Warm and dry.  Multiple isolated unroofed pustular lesions.  NEURO:  Alert and oriented x 3. Cranial nerves II through XII intact. PSYCH:  Cognitively intact     Recent Labs: No results found for requested labs within last 8760 hours.    Lipid Panel No results found for: CHOL, TRIG, HDL, CHOLHDL, VLDL, LDLCALC, LDLDIRECT    Wt Readings from Last 3 Encounters:  07/24/18 215 lb 12.8 oz (97.9 kg)  03/07/18 210 lb (95.3 kg)  02/08/18 210 lb 6.4 oz (95.4 kg)      Other studies Reviewed: Additional studies/ records that were reviewed today include: see H&P  Labs dated 05/09/17: potassium 3.3. Other CMET and CBC normal. Dated 12/21/16: cholesterol 234, triglycerides 342, HDL 35, LDL 131.    ASSESSMENT AND PLAN:  1.  Neurocardiogenic syncope/ POTS. Classic symptoms/ findings. Continue Florinef and midodrin. Continue Northera. Clinically much better. We could consider tapering midodrine or Florinef but given the other issues she is now facing I would favor continuing current therapy.  Liberalize sodium intake and avoid diuretics. Continue support hose. Will follow up in 4 months. 2. OSA. Needs to see Dr. Tresa Endo to assess options for masks.  3. Obesity 4. Prediabetes.  5. Wegener's granulomatosis with flair. S/p high dose steroids and chemoRx.  6. Peripheral neuropathy. 7. Staph skin infection.   Disposition:   FU with me in 4 months  Signed, Mysti Haley Swaziland, MD  07/24/2018 2:16 PM    [facility] [address] [place], [place], [address] Phone [phone], Fax [phone]

## 2018-08-15 ENCOUNTER — Ambulatory Visit: Payer: BC Managed Care – PPO

## 2018-09-11 DIAGNOSIS — M545 Low back pain, unspecified: Secondary | ICD-10-CM | POA: Insufficient documentation

## 2018-09-11 DIAGNOSIS — T85610A Breakdown (mechanical) of epidural and subdural infusion catheter, initial encounter: Secondary | ICD-10-CM | POA: Insufficient documentation

## 2018-10-09 DIAGNOSIS — R1084 Generalized abdominal pain: Secondary | ICD-10-CM | POA: Insufficient documentation

## 2018-10-21 ENCOUNTER — Encounter

## 2018-10-21 ENCOUNTER — Ambulatory Visit: Payer: BC Managed Care – PPO | Admitting: Cardiology

## 2018-11-12 ENCOUNTER — Encounter: Payer: Self-pay | Admitting: Cardiology

## 2018-11-27 NOTE — Progress Notes (Deleted)
Cardiology Office Note   Date:  11/27/2018   ID:  Stacey CromerLisa [name], DOB 10-Jun-1966, MRN [id number]  PCP:  Elizabeth PalauAnderson, [name], [name]  Cardiologist:   Stacey Lukic SwazilandJordan, MD   No chief complaint on file.     History of Present Illness: [name] is a 53 y.o. female who is seen for follow up of autonomic dysfunction/POTS. She has a history of DM, HTN, OSA, and Wegener's granulomatosis. She also has neuropathy with chronic pain. Prior cardiac work up includes a normal Echo in 2012 at the [place] [facility]. She also had a tilt table test in October 2014 and has been treated for Neurocardiogenic syncope with chronic Florinef. In 2015 [facility] which was normal albeit with poor functional status.   She does have a history of sleep apnea but hasn't used CPAP. States she could not tolerate mask on her nose [facility] but this was before she has reconstructive surgery. She is s/p chemotherapy for Wegeners and is on chronic steroids. No palpitations. Denies history but BS show she is prediabetic.   She was placed on Florinef with persistent symptoms. She was later added on midodrin. Symptoms persisted and she was seen in consult by Dr. Sharrell KuGreg Taylor on 01/25/18. It was felt that she had classic Neurocardiogenic syncope symptoms with autonomic dysfunction. Continue medical and lifestyle modification recommended. She did have a sleep study demonstrating OSA and CPAP therapy recommended.  Since her last visit she was started on Northera for her autonomic dysfunction/POTS. Followed by our Pharm D. In July she experienced severe leg fatigue and weakness. This was not felt to be [facility] but this was held until her symptoms could be evaluated. [facility]. Results of her evaluation there reported by the patient was that this was related to a flair of her Wegeners disease. She was treated with high dose steroids and later had 2 rounds of chemo with  Rituxan.  Later Northera resumed and now on 300 mg tid. Steroids have been weaned down.  She also developed multiple skin lesions that were oozing. Cultures positive for staph infection and she is now on antibiotics.   She does feel her orthostatic symptoms are better on Northera. Still on Florinef and midodrine. Hasn't been able to use CPAP regularly since the mask makes her nose hurt (with Wegener's). Tends to fall asleep easily.     Past Medical History:  Diagnosis Date  . Chronic cough   . Diabetes mellitus without complication (HCC)   . Hypertension   . OSA (obstructive sleep apnea)   . Peripheral neuropathy   . Wegener's granulomatosis (HCC) 2009  . Wegner's disease (congenital syphilitic osteochondritis)     Past Surgical History:  Procedure Laterality Date  . APPENDECTOMY    . BACK SURGERY    . CHOLECYSTECTOMY    . COLONOSCOPY N/A 06/02/2013   Procedure: COLONOSCOPY;  Surgeon: Graylin ShiverSalem F Ganem, MD;  [facility];  Service: Endoscopy;  Laterality: N/A;  . HERNIA REPAIR  2000  . INCISION AND DRAINAGE ABSCESS Left 07/07/2016   Procedure: DEBRIDMENT LEFT THIGH ABSCESS, EXISION ACUTE SKIN RASH LEFT THIGH(1CM LESION);  Surgeon: Stacey KelpHaywood Ingram, MD;  Location: [facility] ORS;  Service: General;  Laterality: Left;  . SEPTOPLASTY  02/2013  . spleen anuyism    . splenic aneurysm    . VAGINAL HYSTERECTOMY       Current Outpatient Medications  Medication Sig Dispense Refill  .  acetaminophen (TYLENOL) 325 MG tablet Take 2 tablets (650 mg total) by mouth every 6 (six) hours as needed for mild pain (or Fever >/= 101).    Marland Kitchen. albuterol (PROVENTIL HFA;VENTOLIN HFA) 108 (90 Base) MCG/ACT inhaler Inhale 2 puffs into the lungs every 6 (six) hours as needed for wheezing or shortness of breath.    Marland Kitchen. b complex vitamins tablet Take 1 tablet by mouth daily.    . Calcium Carb-Cholecalciferol (CALCIUM CARBONATE-VITAMIN D3 PO) Take 1 tablet by mouth daily.    . clotrimazole (MYCELEX) 10 MG troche  Take 10 mg by mouth daily as needed.    . conjugated estrogens (PREMARIN) vaginal cream Place 1 Applicatorful vaginally 2 (two) times a week.    Marland Kitchen. dextromethorphan-guaiFENesin (MUCINEX DM) 30-600 MG 12hr tablet Take 1 tablet by mouth 2 (two) times daily as needed for cough.    . Droxidopa 300 MG CAPS Take 300 mg by mouth 3 (three) times daily. 270 capsule 3  . DULoxetine (CYMBALTA) 60 MG capsule Take 60 mg by mouth 2 (two) times daily.    . ferrous sulfate 325 (65 FE) MG tablet Take 325 mg by mouth daily with breakfast.    . fludrocortisone (FLORINEF) 0.1 MG tablet TAKE 2 TABLETS (0.2 MG TOTAL) BY MOUTH DAILY. 90 tablet 3  . fluticasone (FLONASE) 50 MCG/ACT nasal spray Place 2 sprays into both nostrils daily as needed for rhinitis.    . folic acid (FOLVITE) 1 MG tablet Take 1 mg by mouth daily.    . furosemide (LASIX) 40 MG tablet Take 40 mg by mouth as directed.     . gabapentin (NEURONTIN) 300 MG capsule Take 1,200-1,600 mg by mouth 2 (two) times daily. Pt takes three capsules in the morning and four at night.    . lidocaine (LIDODERM) 5 % Place 1 patch onto the skin daily as needed (for pain). Remove & Discard patch within 12 hours or as directed by MD    . Stacey EinsteinLINZESS 145 MCG CAPS capsule Take 145 mcg by mouth daily.  5  . methotrexate 50 MG/2ML injection Inject 1 mL into the skin.    Marland Kitchen. metroNIDAZOLE (FLAGYL) 500 MG tablet 500 mg as directed.   0  . midodrine (PROAMATINE) 10 MG tablet Take 1 tablet (10 mg total) by mouth 3 (three) times daily. 90 tablet 6  . midodrine (PROAMATINE) 5 MG tablet TAKE 1 TABLET (5 MG TOTAL) BY MOUTH 3 (THREE) TIMES DAILY WITH MEALS. 270 tablet 2  . montelukast (SINGULAIR) 10 MG tablet Take 10 mg by mouth daily.  3  . Mouthwash Compounding Base (MOUTH WASH-GP) LIQD Take by mouth. Lidocaine-Diphenhydramine-Aluminum-Magnesium-Simethicone    . mupirocin ointment (BACTROBAN) 2 % Place 1 application into the nose daily as needed.    . pantoprazole (PROTONIX) 40 MG tablet  Take 40 mg by mouth daily.    . predniSONE (DELTASONE) 5 MG tablet Take 5 mg by mouth daily with breakfast.    . PRESCRIPTION MEDICATION by Intrathecal route as directed. ziconotide (PF) 25 mcg/mL, BUPivacaine (PF) 18 mg/mL 20 mL intrathecal pump refill    . sulfamethoxazole-trimethoprim (BACTRIM,SEPTRA) 400-80 MG tablet TAKE 1 TABLET 3 TIMES A WEEK  3  . tizanidine (ZANAFLEX) 2 MG capsule TAKE ONE CAPSULE BY MOUTH 3 TIMES A DAY AS NEEDED FOR MUSCLE SPASMS    . topiramate (TOPAMAX) 100 MG tablet Take 100 mg by mouth daily.    . traZODone (DESYREL) 50 MG tablet Take 50 mg by mouth at bedtime.    .Marland Kitchen  valACYclovir (VALTREX) 500 MG tablet Take 1,000 mg by mouth 2 (two) times daily as needed.    . vitamin B-12 (CYANOCOBALAMIN) 1000 MCG tablet Take 1,000 mcg by mouth daily.    . Vitamin D, Ergocalciferol, (DRISDOL) 50000 units CAPS capsule Take 50,000 Units by mouth every 7 (seven) days. Pt takes on Friday.    Marland Kitchen ZICONOTIDE ACETATE IT by Intrathecal route every 3 (three) months.     No current facility-administered medications for this visit.     Allergies:   Tapentadol; Ibuprofen; and Penicillins    Social History:  The patient  reports that she has never smoked. She has never used smokeless tobacco. She reports current alcohol use. She reports that she does not use drugs.   Family History:  The patient's family history includes Diabetes in her father and mother; Heart disease in her mother.    ROS:  Please see the history of present illness.   Otherwise, review of systems are positive for none.   All other systems are reviewed and negative.    PHYSICAL EXAM: VS:  There were no vitals taken for this visit. , BMI There is no height or weight on file to calculate BMI. GENERAL:  Well appearing, obese WF in NAD HEENT:  PERRL, EOMI, sclera are clear. Oropharynx is clear. NECK:  No jugular venous distention, carotid upstroke brisk and symmetric, no bruits, no thyromegaly or adenopathy LUNGS:  Clear  to auscultation bilaterally CHEST:  Unremarkable HEART:  RRR,  PMI not displaced or sustained,S1 and S2 within normal limits, no S3, no S4: no clicks, no rubs, no murmurs ABD:  Soft, nontender. BS +, no masses or bruits. No hepatomegaly, no splenomegaly EXT:  2 + pulses throughout, no edema, no cyanosis no clubbing SKIN:  Warm and dry.  Multiple isolated unroofed pustular lesions.  NEURO:  Alert and oriented x 3. Cranial nerves II through XII intact. PSYCH:  Cognitively intact     Recent Labs: No results found for requested labs within last 8760 hours.    Lipid Panel No results found for: CHOL, TRIG, HDL, CHOLHDL, VLDL, LDLCALC, LDLDIRECT    Wt Readings from Last 3 Encounters:  07/24/18 215 lb 12.8 oz (97.9 kg)  03/07/18 210 lb (95.3 kg)  02/08/18 210 lb 6.4 oz (95.4 kg)      Other studies Reviewed: Additional studies/ records that were reviewed today include: see H&P  Labs dated 05/09/17: potassium 3.3. Other CMET and CBC normal. Dated 12/21/16: cholesterol 234, triglycerides 342, HDL 35, LDL 131.  Dated 10/08/18: Hgb 10.5, WBC 12. CRP 1.41, sed rate 26.  Dated 10/30/18: cholesterol 215, triglycerides 332, HDL 35, LDL 114. A1c 6.8%. potassium 3.2. other chemistries normal.    ASSESSMENT AND PLAN:  1.  Neurocardiogenic syncope/ POTS. Classic symptoms/ findings. Continue Florinef and midodrin. Continue Northera. Clinically much better. We could consider tapering midodrine or Florinef but given the other issues she is now facing I would favor continuing current therapy.  Liberalize sodium intake and avoid diuretics. Continue support hose. Will follow up in 4 months. 2. OSA. Needs to see Dr. Tresa Endo to assess options for masks.  3. Obesity 4. Prediabetes.  5. Wegener's granulomatosis with flair. S/p high dose steroids and chemoRx.  6. Peripheral neuropathy. 7. Staph skin infection.   Disposition:   FU with me in 4 months  Signed, Zailah Zagami Swaziland, MD  11/27/2018 9:53 AM    [facility] [address], [place], [place], [address] Phone [phone], Fax  [phone] 

## 2018-11-29 ENCOUNTER — Ambulatory Visit: Payer: BC Managed Care – PPO | Admitting: Cardiology

## 2018-12-02 ENCOUNTER — Encounter: Payer: Self-pay | Admitting: *Deleted

## 2018-12-05 ENCOUNTER — Other Ambulatory Visit: Payer: Self-pay | Admitting: Cardiology

## 2018-12-10 ENCOUNTER — Telehealth: Payer: Self-pay | Admitting: *Deleted

## 2018-12-10 NOTE — Telephone Encounter (Signed)
Patient calling to see if she can have samples of Northera.  Currently at [facility] we do not carry those samples.  I will forward to Dr. Swaziland and Ms. Cheryl for further assistance.

## 2018-12-10 NOTE — Telephone Encounter (Signed)
Spoke to patient advised we have Northera 100 mg.Advised to take 3 capsules three times a day.Stated she is trying to get patient assistance.

## 2019-01-03 MED ORDER — LEVOFLOXACIN 750 MG PO TABS
750.00 | ORAL_TABLET | ORAL | Status: DC
Start: 2019-01-03 — End: 2019-01-03

## 2019-01-03 MED ORDER — FERROUS SULFATE 325 (65 FE) MG PO TABS
325.00 | ORAL_TABLET | ORAL | Status: DC
Start: 2019-01-02 — End: 2019-01-03

## 2019-01-03 MED ORDER — FLUDROCORTISONE ACETATE 0.1 MG PO TABS
.10 | ORAL_TABLET | ORAL | Status: DC
Start: 2019-01-02 — End: 2019-01-03

## 2019-01-03 MED ORDER — GENERIC EXTERNAL MEDICATION
150.00 | Status: DC
Start: 2019-01-02 — End: 2019-01-03

## 2019-01-03 MED ORDER — INSULIN GLARGINE 100 UNIT/ML ~~LOC~~ SOLN
1.00 | SUBCUTANEOUS | Status: DC
Start: ? — End: 2019-01-03

## 2019-01-03 MED ORDER — GENERIC EXTERNAL MEDICATION
1.00 | Status: DC
Start: ? — End: 2019-01-03

## 2019-01-03 MED ORDER — PREDNISONE 20 MG PO TABS
60.00 | ORAL_TABLET | ORAL | Status: DC
Start: 2019-01-03 — End: 2019-01-03

## 2019-01-03 MED ORDER — PANTOPRAZOLE SODIUM 40 MG PO TBEC
40.00 | DELAYED_RELEASE_TABLET | ORAL | Status: DC
Start: 2019-01-02 — End: 2019-01-03

## 2019-01-03 MED ORDER — NITROGLYCERIN 0.4 MG SL SUBL
0.40 | SUBLINGUAL_TABLET | SUBLINGUAL | Status: DC
Start: ? — End: 2019-01-03

## 2019-01-03 MED ORDER — GENERIC EXTERNAL MEDICATION
4.00 | Status: DC
Start: ? — End: 2019-01-03

## 2019-01-03 MED ORDER — INSULIN GLARGINE 100 UNIT/ML ~~LOC~~ SOLN
1.00 | SUBCUTANEOUS | Status: DC
Start: 2019-01-02 — End: 2019-01-03

## 2019-01-03 MED ORDER — GENERIC EXTERNAL MEDICATION
10.00 | Status: DC
Start: ? — End: 2019-01-03

## 2019-01-03 MED ORDER — ALBUTEROL SULFATE (2.5 MG/3ML) 0.083% IN NEBU
2.50 | INHALATION_SOLUTION | RESPIRATORY_TRACT | Status: DC
Start: ? — End: 2019-01-03

## 2019-01-03 MED ORDER — HYDROCODONE-ACETAMINOPHEN 10-325 MG PO TABS
1.00 | ORAL_TABLET | ORAL | Status: DC
Start: ? — End: 2019-01-03

## 2019-01-03 MED ORDER — GABAPENTIN 400 MG PO CAPS
1200.00 | ORAL_CAPSULE | ORAL | Status: DC
Start: 2019-01-02 — End: 2019-01-03

## 2019-01-03 MED ORDER — SUCRALFATE 1 G PO TABS
1.00 | ORAL_TABLET | ORAL | Status: DC
Start: 2019-01-02 — End: 2019-01-03

## 2019-01-03 MED ORDER — ACETAMINOPHEN 325 MG PO TABS
650.00 | ORAL_TABLET | ORAL | Status: DC
Start: ? — End: 2019-01-03

## 2019-01-03 MED ORDER — MONTELUKAST SODIUM 10 MG PO TABS
10.00 | ORAL_TABLET | ORAL | Status: DC
Start: 2019-01-02 — End: 2019-01-03

## 2019-01-03 MED ORDER — GABAPENTIN 300 MG PO CAPS
900.00 | ORAL_CAPSULE | ORAL | Status: DC
Start: 2019-01-03 — End: 2019-01-03

## 2019-01-03 MED ORDER — HYDRALAZINE HCL 20 MG/ML IJ SOLN
10.00 | INTRAMUSCULAR | Status: DC
Start: ? — End: 2019-01-03

## 2019-01-03 MED ORDER — LINACLOTIDE 145 MCG PO CAPS
145.00 | ORAL_CAPSULE | ORAL | Status: DC
Start: 2019-01-03 — End: 2019-01-03

## 2019-01-03 MED ORDER — DROXIDOPA 300 MG PO CAPS
300.00 | ORAL_CAPSULE | ORAL | Status: DC
Start: 2019-01-02 — End: 2019-01-03

## 2019-01-03 MED ORDER — DM-GUAIFENESIN ER 30-600 MG PO TB12
2.00 | ORAL_TABLET | ORAL | Status: DC
Start: ? — End: 2019-01-03

## 2019-01-03 MED ORDER — FOLIC ACID 1 MG PO TABS
1.00 | ORAL_TABLET | ORAL | Status: DC
Start: 2019-01-02 — End: 2019-01-03

## 2019-01-03 MED ORDER — ENOXAPARIN SODIUM 40 MG/0.4ML ~~LOC~~ SOLN
40.00 | SUBCUTANEOUS | Status: DC
Start: 2019-01-03 — End: 2019-01-03

## 2019-01-03 MED ORDER — MORPHINE SULFATE (PF) 4 MG/ML IV SOLN
2.00 | INTRAVENOUS | Status: DC
Start: ? — End: 2019-01-03

## 2019-01-03 MED ORDER — GABAPENTIN 300 MG PO CAPS
600.00 | ORAL_CAPSULE | ORAL | Status: DC
Start: 2019-01-03 — End: 2019-01-03

## 2019-01-03 MED ORDER — DULOXETINE HCL 30 MG PO CPEP
120.00 | ORAL_CAPSULE | ORAL | Status: DC
Start: 2019-01-02 — End: 2019-01-03

## 2019-01-03 MED ORDER — ALUM & MAG HYDROXIDE-SIMETH 200-200-20 MG/5ML PO SUSP
30.00 | ORAL | Status: DC
Start: ? — End: 2019-01-03

## 2019-01-03 MED ORDER — TAPENTADOL HCL 50 MG PO TABS
100.00 | ORAL_TABLET | ORAL | Status: DC
Start: 2019-01-02 — End: 2019-01-03

## 2019-01-03 MED ORDER — POTASSIUM CHLORIDE CRYS ER 20 MEQ PO TBCR
20.00 | EXTENDED_RELEASE_TABLET | ORAL | Status: DC
Start: 2019-01-03 — End: 2019-01-03

## 2019-01-03 MED ORDER — BUDESONIDE-FORMOTEROL FUMARATE 160-4.5 MCG/ACT IN AERO
2.00 | INHALATION_SPRAY | RESPIRATORY_TRACT | Status: DC
Start: 2019-01-02 — End: 2019-01-03

## 2019-01-03 MED ORDER — GENERIC EXTERNAL MEDICATION
650.00 | Status: DC
Start: ? — End: 2019-01-03

## 2019-01-03 MED ORDER — INSULIN LISPRO 100 UNIT/ML ~~LOC~~ SOLN
1.00 | SUBCUTANEOUS | Status: DC
Start: 2019-01-02 — End: 2019-01-03

## 2019-01-03 MED ORDER — ACETAMINOPHEN 650 MG RE SUPP
650.00 | RECTAL | Status: DC
Start: ? — End: 2019-01-03

## 2019-01-03 MED ORDER — SULFAMETHOXAZOLE-TRIMETHOPRIM 400-80 MG PO TABS
1.00 | ORAL_TABLET | ORAL | Status: DC
Start: 2019-01-03 — End: 2019-01-03

## 2019-01-03 MED ORDER — SODIUM CHLORIDE 0.9 % IV SOLN
10.00 | INTRAVENOUS | Status: DC
Start: ? — End: 2019-01-03

## 2019-01-03 MED ORDER — INSULIN LISPRO 100 UNIT/ML ~~LOC~~ SOLN
1.00 | SUBCUTANEOUS | Status: DC
Start: ? — End: 2019-01-03

## 2019-01-10 ENCOUNTER — Telehealth: Payer: Self-pay

## 2019-01-10 NOTE — Telephone Encounter (Signed)
Returned call to patient left [name]'s advice on personal voice mail. 

## 2019-01-10 NOTE — Telephone Encounter (Signed)
Spoke to patient she rescheduled appointment with Dr.Jordan 01/13/19 due to Covid 19.Stated she is getting over pneumonia.She is not having any chest pain.Sob no worse.No swelling in lower leg and no syncope.Stated she needs to speak to pharmacist,she lost phone # she gave her to help with cost of Northera.Message sent to pharmacy.

## 2019-01-10 NOTE — Telephone Encounter (Signed)
Pharmacist out of the office until Monday. Please let patient know we will contact her ASAP.   For Northera financial support  patient may call [phone].

## 2019-01-14 ENCOUNTER — Ambulatory Visit: Payer: BC Managed Care – PPO | Admitting: Cardiology

## 2019-03-01 ENCOUNTER — Other Ambulatory Visit: Payer: Self-pay | Admitting: Cardiology

## 2019-03-21 NOTE — Progress Notes (Signed)
Virtual Visit via Video Note   This visit type was conducted due to national recommendations for restrictions regarding the COVID-19 Pandemic (e.g. social distancing) in an effort to limit this patient's exposure and mitigate transmission in our community.  Due to her co-morbid illnesses, this patient is at least at moderate risk for complications without adequate follow up.  This format is felt to be most appropriate for this patient at this time.  All issues noted in this document were discussed and addressed.  A limited physical exam was performed with this format.  Please refer to the patient's chart for her consent to telehealth for [facility].   Date:  03/25/2019   ID:  [name], DOB 1966/06/29, MRN [id number]  Patient Location: Home Provider Location: Office  PCP:  Elizabeth Palau, [name]  Cardiologist: Hisham Provence [place]il[place] MD Electrophysiologist:  None   Evaluation Performed:  Follow-Up Visit  Chief Complaint:  Orthostatic hypotension  History of Present Illness:    [name] is a 53 y.o. female seen for follow up of autonomic dysfunction/POTS. She has a history of DM, HTN, OSA, and Wegener's granulomatosis. She also has neuropathy with chronic pain. Prior cardiac work up includes a normal Echo in 2012 at the [place] [facility]. She also had a tilt table test in October 2014 and has been treated for Neurocardiogenic syncope with chronic Florinef. In 2015 she had a stress Echo at [facility] which was normal albeit with poor functional status.   She does have a history of sleep apnea but hasn't used CPAP. States she could not tolerate mask on her nose due to damage from [facility] but this was before she has reconstructive surgery. She is s/p chemotherapy for Wegeners and is on chronic steroids. No palpitations. Denies history but BS show she is prediabetic.   She was placed on Florinef with persistent symptoms. She was later added on midodrin. Symptoms persisted and she was seen in consult  by Dr. Sharrell Ku on 01/25/18. It was felt that she had classic Neurocardiogenic syncope symptoms with autonomic dysfunction. Continue medical and lifestyle modification recommended. She did have a sleep study demonstrating OSA and CPAP therapy recommended.  Since her last visit she was started on Northera for her autonomic dysfunction/POTS. Followed by our Pharm D. In July she experienced severe leg fatigue and weakness. This was not felt to be related to [facility] but this was held until her symptoms could be evaluated. She was hospitalized at [facility]. Results of her evaluation there reported by the patient was that this was related to a flair of her Wegeners disease. She was treated with high dose steroids and later had 2 rounds of chemo with Rituxan.  Later Northera resumed and now on 300 mg tid. Steroids have been weaned down. She has had difficulty with CPAP therapy due to her granulomatosis. When seen by pulmonary in February a trial of BIPAP was recommended.  She was admitted in March with respiratory failure felt to be related to granulomatosis/polyangiitis. She was treated with steroids. Also placed on antibiotics due to fever and extensive skin lesions.   She states she is not able to take the Northera as much as she should because cost is prohibitive. She states breathing is better. She still has multiple ulcerations on her skin that aren't going away. Scheduled to see Dermatology next month. She does complain of fluid retention. With the steroids weight is up 18 lbs. She is now on 10 mg daily of prednisone. She is tolerating BIPAP  well.   The patient does not have symptoms concerning for COVID-19 infection (fever, chills, cough, or new shortness of breath).    Past Medical History:  Diagnosis Date  . Chronic cough   . Diabetes mellitus without complication (HCC)   . Hypertension   . OSA (obstructive sleep apnea)   . Peripheral neuropathy   . Wegener's granulomatosis (HCC) 2009   . Wegner's disease (congenital syphilitic osteochondritis)    Past Surgical History:  Procedure Laterality Date  . APPENDECTOMY    . BACK SURGERY    . CHOLECYSTECTOMY    . COLONOSCOPY N/A 06/02/2013   Procedure: COLONOSCOPY;  Surgeon: Graylin Shiver, MD;  Location[facility] ENDOSCOPY;  Service: Endoscopy;  Laterality: N/A;  . HERNIA REPAIR  2000  . INCISION AND DRAINAGE ABSCESS Left 07/07/2016   Procedure: DEBRIDMENT LEFT THIGH ABSCESS, EXISION ACUTE SKIN RASH LEFT THIGH(1CM LESION);  Surgeon: Claud Kelp, MD;  Location: [facility] ORS;  Service: General;  Laterality: Left;  . SEPTOPLASTY  02/2013  . spleen anuyism    . splenic aneurysm    . VAGINAL HYSTERECTOMY       Current Meds  Medication Sig  . acetaminophen (TYLENOL) 325 MG tablet Take 2 tablets (650 mg total) by mouth every 6 (six) hours as needed for mild pain (or Fever >/= 101).  Marland Kitchen albuterol (PROVENTIL HFA;VENTOLIN HFA) 108 (90 Base) MCG/ACT inhaler Inhale 2 puffs into the lungs every 6 (six) hours as needed for wheezing or shortness of breath.  Marland Kitchen b complex vitamins tablet Take 1 tablet by mouth daily.  . Calcium Carb-Cholecalciferol (CALCIUM CARBONATE-VITAMIN D3 PO) Take 1 tablet by mouth daily.  . clotrimazole (MYCELEX) 10 MG troche Take 10 mg by mouth daily as needed.  . conjugated estrogens (PREMARIN) vaginal cream Place 1 Applicatorful vaginally 2 (two) times a week.  Marland Kitchen dextromethorphan-guaiFENesin (MUCINEX DM) 30-600 MG 12hr tablet Take 1 tablet by mouth 2 (two) times daily as needed for cough.  . Droxidopa 300 MG CAPS Take 300 mg by mouth 3 (three) times daily.  . DULoxetine (CYMBALTA) 60 MG capsule Take 60 mg by mouth 2 (two) times daily.  . ferrous sulfate 325 (65 FE) MG tablet Take 325 mg by mouth daily with breakfast.  . fludrocortisone (FLORINEF) 0.1 MG tablet TAKE 2 TABLETS BY MOUTH EVERY DAY  . fluticasone (FLONASE) 50 MCG/ACT nasal spray Place 2 sprays into both nostrils daily as needed for rhinitis.  . folic acid (FOLVITE)  1 MG tablet Take 1 mg by mouth daily.  . furosemide (LASIX) 40 MG tablet Take 40 mg by mouth as directed.   . lidocaine (LIDODERM) 5 % Place 1 patch onto the skin daily as needed (for pain). Remove & Discard patch within 12 hours or as directed by MD  . Karlene Einstein 145 MCG CAPS capsule Take 145 mcg by mouth daily.  . methotrexate 50 MG/2ML injection Inject 1 mL into the skin once a week.   . metroNIDAZOLE (FLAGYL) 500 MG tablet 500 mg as directed.   . montelukast (SINGULAIR) 10 MG tablet Take 10 mg by mouth daily.  . pantoprazole (PROTONIX) 40 MG tablet Take 40 mg by mouth daily.  . predniSONE (DELTASONE) 5 MG tablet Take 5 mg by mouth daily with breakfast.  . PRESCRIPTION MEDICATION by Intrathecal route as directed. ziconotide (PF) 25 mcg/mL, BUPivacaine (PF) 18 mg/mL 20 mL intrathecal pump refill  . sulfamethoxazole-trimethoprim (BACTRIM,SEPTRA) 400-80 MG tablet TAKE 1 TABLET 3 TIMES A WEEK  . tizanidine (ZANAFLEX) 2 MG capsule  TAKE ONE CAPSULE BY MOUTH 3 TIMES A DAY AS NEEDED FOR MUSCLE SPASMS  . topiramate (TOPAMAX) 100 MG tablet Take 100 mg by mouth daily.  . traZODone (DESYREL) 50 MG tablet Take 50 mg by mouth at bedtime.  . valACYclovir (VALTREX) 500 MG tablet Take 1,000 mg by mouth 2 (two) times daily as needed.  . vitamin B-12 (CYANOCOBALAMIN) 1000 MCG tablet Take 1,000 mcg by mouth daily.  . Vitamin D, Ergocalciferol, (DRISDOL) 50000 units CAPS capsule Take 50,000 Units by mouth every 7 (seven) days. Pt takes on Friday.  Marland Kitchen. ZICONOTIDE ACETATE IT by Intrathecal route every 3 (three) months.     Allergies:   Tapentadol; Ibuprofen; and Penicillins   Social History   Tobacco Use  . Smoking status: Never Smoker  . Smokeless tobacco: Never Used  Substance Use Topics  . Alcohol use: Yes    Comment: occasionally  . Drug use: No     Family Hx: The patient's family history includes Diabetes in her father and mother; Heart disease in her mother.  ROS:   Please see the history of  present illness.    She developed a kidney stone this weekend. Seeing Urology on Thursday. She is also being treated for an anal fissure. All other systems reviewed and are negative.   Prior CV studies:   The following studies were reviewed today:  none  Labs/Other Tests and Data Reviewed:    EKG:  No ECG reviewed.  Recent Labs: No results found for requested labs within last 8760 hours.   Recent Lipid Panel No results found for: CHOL, TRIG, HDL, CHOLHDL, LDLCALC, LDLDIRECT  Wt Readings from Last 3 Encounters:  03/25/19 233 lb (105.7 kg)  07/24/18 215 lb 12.8 oz (97.9 kg)  03/07/18 210 lb (95.3 kg)     Objective:    Vital Signs:  BP 132/82   Pulse 66   Ht 5\' 6"  (1.676 m)   Wt 233 lb (105.7 kg)   BMI 37.61 kg/m    VITAL SIGNS:  reviewed  General obese no distress HEENT. Sclera are clear Respirations unlabored. Skin with ulcerated lesions Neuro alert and oriented x 3. Nonfocal. Mood normal  ASSESSMENT & PLAN:    1.  Neurocardiogenic syncope/ POTS. Classic symptoms/ findings. Continue Florinef. She had an excellent clinical response to  Northera but now cost is prohibitive. Avoid diuretics. Continue support hose. We will check and see if there is any patient assistance available for the Northera. 2. OSA. now on BIPAP 3. Obesity. Significant weight gain related to steroids.  4. Prediabetes.  5. Wegener's granulomatosis with flair. S/p high dose steroids and chemoRx.  6. Peripheral neuropathy. 7. Skin ulceration. Follow up with dermatology 8. Kidney stone.   COVID-19 Education: The signs and symptoms of COVID-19 were discussed with the patient and how to seek care for testing (follow up with PCP or arrange E-visit).  The importance of social distancing was discussed today.  Time:   Today, I have spent 20 minutes with the patient with telehealth technology discussing the above problems.     Medication Adjustments/Labs and Tests Ordered: Current medicines are  reviewed at length with the patient today.  Concerns regarding medicines are outlined above.   Tests Ordered: No orders of the defined types were placed in this encounter.   Medication Changes: No orders of the defined types were placed in this encounter.   Disposition:  Follow up in 6 month(s)  Signed, Nayeli Calvert SwazilandJordan, MD  03/25/2019 11:58 AM  [facility]

## 2019-03-24 ENCOUNTER — Telehealth: Payer: Self-pay | Admitting: Cardiology

## 2019-03-24 NOTE — Telephone Encounter (Signed)
smartphone, pre-reg complete, mychart pending, verbal consent given 03/24/2019 MS

## 2019-03-25 ENCOUNTER — Telehealth (INDEPENDENT_AMBULATORY_CARE_PROVIDER_SITE_OTHER): Payer: BC Managed Care – PPO | Admitting: Cardiology

## 2019-03-25 ENCOUNTER — Encounter: Payer: Self-pay | Admitting: Cardiology

## 2019-03-25 VITALS — BP 132/82 | HR 66 | Ht 66.0 in | Wt 233.0 lb

## 2019-03-25 DIAGNOSIS — R000 Tachycardia, unspecified: Secondary | ICD-10-CM

## 2019-03-25 DIAGNOSIS — R55 Syncope and collapse: Secondary | ICD-10-CM

## 2019-03-25 DIAGNOSIS — I951 Orthostatic hypotension: Secondary | ICD-10-CM

## 2019-03-25 DIAGNOSIS — G4733 Obstructive sleep apnea (adult) (pediatric): Secondary | ICD-10-CM

## 2019-03-25 DIAGNOSIS — I498 Other specified cardiac arrhythmias: Secondary | ICD-10-CM

## 2019-03-25 DIAGNOSIS — G90A Postural orthostatic tachycardia syndrome (POTS): Secondary | ICD-10-CM

## 2019-03-25 NOTE — Patient Instructions (Signed)
Medication Instructions:  Continue same medications    Dr.Jordan sent a message to our pharmacy for help with [facility] Pharmacist will be giving you a call. If you need a refill on your cardiac medications before your next appointment, please call your pharmacy.   Lab work: None ordered   Testing/Procedures: None ordered  Follow-Up: At [facility], you and your health needs are our priority.  As part of our continuing mission to provide you with exceptional heart care, we have created designated Provider Care Teams.  These Care Teams include your primary Cardiologist (physician) and Advanced Practice Providers (APPs -  Physician Assistants and Nurse Practitioners) who all work together to provide you with the care you need, when you need it. . Schedule follow up appointment in 6 months    Call 3 months before to schedule

## 2019-05-29 ENCOUNTER — Other Ambulatory Visit: Payer: Self-pay | Admitting: Cardiology

## 2019-09-08 DIAGNOSIS — N1831 Chronic kidney disease, stage 3a: Secondary | ICD-10-CM | POA: Diagnosis present

## 2019-10-12 NOTE — Progress Notes (Signed)
Cardiology Office Note:    Date:  10/14/2019   ID:  [name], DOB 03/25/1966, MRN [id number]  PCP:  Vicenta Aly, [place]  Cardiologist:  Peter Martinique, MD  Electrophysiologist:  None   Referring MD: Vicenta Aly, [name]   Chief Complaint: follow-up of POTS  History of Present Illness:    [name] is a 53 y.o. female with a history of autonomic dysfunction/POTS/neurocardiogenic syncope, diabetes mellitus, obstructive sleep apnea unable to tolerate CPAP or BiPAP mask due to her granulomatosis, Wegener's granulomatosis, neuropathy with chronic pain, and CKD stage III who is followed by Dr. Martinique.   Patient has history of hypotension with many years with symptoms of dizziness, fatigue, lethargy, and inability to focus. She has had multiple falls with injuries. Prior cardiac work-up includes a normal Echo at [facility] in 2012. She also had a Tilt Table Test in 07/2013 and has been treated for neurocardiogenic syncope with chronic Florinef. In 2015, she had a normal stress Echo but with poor functional status at [facility]. Symptoms persisted despite Florinef; therefore, patient was also placed on Midodrine. She was seen by Dr. Lovena Le in 01/2018 and it was felt like she had classic neurocardiogenic syncope symptoms with autonomic dysfunction. Continued medical and lifestyle modification was recommended. Symptoms persisted on both Florinef and Midodrine so she was seen by our Pharm D in 03/2018 and started on Northera. This was briefly held after patient developed severe leg fatigue and weakness. It was later felt that this was due to a flair of her Wegener's disease and Northera was able to be resumed. She is currently on Northera 300mg  three times daily. Symptoms have been better since starting Northera.   She was admitted in 12/2018 with respiratory failure which was felt to be due to granulomatosis/polyangitis. She was treated with steroids. Also started on antibiotics due to fever and  extensive skin lesions. Echo during this admission showed LVEF of 65-70% with normal wall motion and diastolic function.  Patient was last seen by Dr. Martinique for a virtual visit in 03/2019 at which time patient reported she was not able to take Northera as much because of the cost. She also reported more fluid retention. Sometime prior to this visit it looks like her Midodrine was stopped. Her breathing had improved since recent hospitalization but still had multiple skin ulcerations that would not go away with plans to see Dermatology soon. At this visit, she reported tolerating BiPAP well for her sleep apnea. No medication changes were made and Dr Martinique was going to see if there was any patient assistance available for Northera. She was advised to follow-up in 6 months.  Since last office visit, she was admitted from 09/10/2019 to 09/15/2019 for bilateral lower toe infection and significant hypokalemia. Following discharge, patient was seen by Dr. 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 He was concerned that Florinef continues to put her at risk for hypokalemia even after lowering the dose. Therefore, Florinef was stopped.    Patient had telehealth visit with PCP (Dr. Ouida Sills) on 10/08/2019 at which time she reported generalized weakness that was worse than usual as well as some nausea/vomting. She also felt feverish and had some body sweats and chills. Patient was advised to go the the ED for further evaluation. COVID test negative. Potassium 2.3 and Magnesium was 1.2. She was treated with aggressive supplementation and weakness improved. She was felt to be stable for discharge with close follow-up.   Patient  states labs are followed closely by PCP. She states she has been referred to a Nephrologist and is hoping to see them in the next few weeks.   Patient presents today for follow-up. Here alone. She  states she has been doing OK from a lightheadedness/dizziness standpoint for the last several weeks. No recurrent syncope since the end of November. She continues to have palpitations with position changes or activity but these seem stable. She also notes some substernal burning sensation that radiates up her neck around meals. Pain is actually worse before meals but typically improves with over the counter reflux medications. Patient is not very active so patient is not sure if she has any exertional symptoms. No arm pain. She notes some shortness of breath with activity as well as some orthopnea. Patient has known sleep apnea but has not been able to tolerate CPAP or BiPAP masks. She is scheduled to see Pulmonology again next month. Patient does report a 15 lb weight gain in the last week. Seems like weight seems to fluctuate from around 200 lbs to around 230 lbs. At last in-office visit in 07/2018, patient weighed 215 lbs. She is 218 lbs today. She notes worsening lower extremity edema over the last week. She states this makes her neuropathy worse. She notes occasional bloody stools due to anal fissures but states she has seen someone for this. No other recent abnormal bleeding.   Past Medical History:  Diagnosis Date  . Chronic cough   . Diabetes mellitus without complication (HCC)   . Hypertension   . OSA (obstructive sleep apnea)   . Peripheral neuropathy   . Wegener's granulomatosis (HCC) 2009  . Wegner's disease (congenital syphilitic osteochondritis)     Past Surgical History:  Procedure Laterality Date  . APPENDECTOMY    . BACK SURGERY    . CHOLECYSTECTOMY    . COLONOSCOPY N/A 06/02/2013   Procedure: COLONOSCOPY;  Surgeon: Graylin Shiver, MD;  Location[facility] ENDOSCOPY;  Service: Endoscopy;  Laterality: N/A;  . HERNIA REPAIR  2000  . INCISION AND DRAINAGE ABSCESS Left 07/07/2016   Procedure: DEBRIDMENT LEFT THIGH ABSCESS, EXISION ACUTE SKIN RASH LEFT THIGH(1CM LESION);  Surgeon: Claud Kelp, MD;  Location: [facility] ORS;  Service: General;  Laterality: Left;  . SEPTOPLASTY  02/2013  . spleen anuyism    . splenic aneurysm    . VAGINAL HYSTERECTOMY      Current Medications: Current Meds  Medication Sig  . acetaminophen (TYLENOL) 325 MG tablet Take 2 tablets (650 mg total) by mouth every 6 (six) hours as needed for mild pain (or Fever >/= 101).  Marland Kitchen albuterol (PROVENTIL HFA;VENTOLIN HFA) 108 (90 Base) MCG/ACT inhaler Inhale 2 puffs into the lungs every 6 (six) hours as needed for wheezing or shortness of breath.  Marland Kitchen b complex vitamins tablet Take 1 tablet by mouth daily.  . Calcium Carb-Cholecalciferol (CALCIUM CARBONATE-VITAMIN D3 PO) Take 1 tablet by mouth daily.  Marland Kitchen dextromethorphan-guaiFENesin (MUCINEX DM) 30-600 MG 12hr tablet Take 1 tablet by mouth 2 (two) times daily as needed for cough.  . DULoxetine (CYMBALTA) 60 MG capsule Take 60 mg by mouth 2 (two) times daily.  . ferrous sulfate 325 (65 FE) MG tablet Take 325 mg by mouth daily with breakfast.  . fluticasone (FLONASE) 50 MCG/ACT nasal spray Place 2 sprays into both nostrils daily as needed for rhinitis.  . folic acid (FOLVITE) 1 MG tablet Take 1 mg by mouth daily.  Marland Kitchen gabapentin (NEURONTIN) 300 MG capsule Take 3  capsules (900 mg total) by mouth in the morning, 2 capsules (600 mg total) in the early afternoon, and 4 capsules (1200 mg total) in the evening.  . lidocaine (LIDODERM) 5 % Place 1 patch onto the skin daily as needed (for pain). Remove & Discard patch within 12 hours or as directed by MD  . LINZESS 145 MCG CAPS capsule Take 145 mcg by mouth daily.  Karlene Einstein. magnesium oxide (MAG-OX) 400 MG tablet Take 400 mg by mouth 2 (two) times daily.  . metFORMIN (GLUCOPHAGE) 500 MG tablet Take 500 mg by mouth 2 (two) times daily with a meal.  . methotrexate 50 MG/2ML injection Inject 1 mL into the skin once a week.   . montelukast (SINGULAIR) 10 MG tablet Take 10 mg by mouth daily.  . pantoprazole (PROTONIX) 40 MG tablet Take 1  tablet (40 mg total) by mouth daily.  . Potassium Chloride Crys ER (K-DUR PO) Take 40 mEq by mouth 3 (three) times daily.  . predniSONE (DELTASONE) 5 MG tablet Take 5 mg by mouth daily with breakfast.  . PRESCRIPTION MEDICATION by Intrathecal route as directed. ziconotide (PF) 25 mcg/mL, BUPivacaine (PF) 18 mg/mL 20 mL intrathecal pump refill  . sulfamethoxazole-trimethoprim (BACTRIM,SEPTRA) 400-80 MG tablet TAKE 1 TABLET 3 TIMES A WEEK  . tapentadol (NUCYNTA) 50 MG tablet Take 50 mg by mouth 2 (two) times daily.  . tizanidine (ZANAFLEX) 2 MG capsule TAKE ONE CAPSULE BY MOUTH 3 TIMES A DAY AS NEEDED FOR MUSCLE SPASMS  . topiramate (TOPAMAX) 100 MG tablet Take 100 mg by mouth daily.  . traZODone (DESYREL) 50 MG tablet Take 50 mg by mouth at bedtime.  . vitamin B-12 (CYANOCOBALAMIN) 1000 MCG tablet Take 1,000 mcg by mouth daily.  . Vitamin D, Ergocalciferol, (DRISDOL) 50000 units CAPS capsule Take 50,000 Units by mouth every 7 (seven) days. Pt takes on Friday.  Marland Kitchen. ZICONOTIDE ACETATE IT by Intrathecal route every 3 (three) months.  . [DISCONTINUED] pantoprazole (PROTONIX) 40 MG tablet Take 40 mg by mouth daily.     Allergies:   Tapentadol, Ibuprofen, and Penicillins   Social History   Socioeconomic History  . Marital status: Married    Spouse name: Not on file  . Number of children: Not on file  . Years of education: Not on file  . Highest education level: Not on file  Occupational History  . Not on file  Tobacco Use  . Smoking status: Never Smoker  . Smokeless tobacco: Never Used  Substance and Sexual Activity  . Alcohol use: Yes    Comment: occasionally  . Drug use: No  . Sexual activity: Not on file  Other Topics Concern  . Not on file  Social History Narrative  . Not on file   Social Determinants of Health   Financial Resource Strain:   . Difficulty of Paying Living Expenses: Not on file  Food Insecurity:   . Worried About Programme researcher, broadcasting/film/videounning Out of Food in the Last Year: Not on file   . Ran Out of Food in the Last Year: Not on file  Transportation Needs:   . Lack of Transportation (Medical): Not on file  . Lack of Transportation (Non-Medical): Not on file  Physical Activity:   . Days of Exercise per Week: Not on file  . Minutes of Exercise per Session: Not on file  Stress:   . Feeling of Stress : Not on file  Social Connections:   . Frequency of Communication with Friends and Family: Not on file  .  Frequency of Social Gatherings with Friends and Family: Not on file  . Attends Religious Services: Not on file  . Active Member of Clubs or Organizations: Not on file  . Attends Banker Meetings: Not on file  . Marital Status: Not on file     Family History: The patient's family history includes Diabetes in her father and mother; Heart disease in her mother.  ROS:   Please see the history of present illness.    All other systems reviewed and are negative.  EKGs/Labs/Other Studies Reviewed:    The following studies were reviewed today:  Echocardiogram 12/27/2018 ([facility]): Summary: The left ventricle is normal in size. Proximal septal thickening is noted. The left ventricular ejection fraction is normal (65-70%). The left ventricular wall motion is normal. The left ventricular diastolic function is normal. The right ventricle is grossly normal in size and function. The left and right atria are normal size. Estimation of right ventricular systolic pressure is not possible. The aortic valve is a normal trileaflet valve with no regurgitation. There is no pericardial effusion.  EKG:  EKG ordered today. EKG personally reviewed and demonstrates normal sinus rhythm, rate 74 bpm, with non-specific ST/T changes but no acute changes compared to prior tracings.  Recent Labs: No results found for requested labs within last 8760 hours.  Recent Lipid Panel No results found for: CHOL, TRIG, HDL, CHOLHDL, VLDL, LDLCALC, LDLDIRECT  Physical Exam:    Vital  Signs: BP 105/76 (Patient Position: Supine)   Pulse 74   Ht 5' 6.5" (1.689 m)   Wt 218 lb 6.4 oz (99.1 kg)   SpO2 95%   BMI 34.72 kg/m     Wt Readings from Last 3 Encounters:  10/14/19 218 lb 6.4 oz (99.1 kg)  03/25/19 233 lb (105.7 kg)  07/24/18 215 lb 12.8 oz (97.9 kg)     General: 53 y.o. female in no acute distress. HEENT: Normocephalic and atraumatic. Sclera clear. EOMs intact. Neck: Supple. No carotid bruits. No JVD appreciated. Heart: RRR. Distinct S1 and S2. No murmurs, gallops, or rubs. Radial pulses 2+ and equal bilaterally. Lungs: No increased work of breathing. Clear to ausculation bilaterally. No wheezes, rhonchi, or rales.  Abdomen: Soft, non-distended, and non-tender to palpation. Bowel sounds present. Extremities:  Mostly non-pitting edema of bilateral lower extremities. Skin: Warm and dry. Neuro: Alert and oriented x3. No focal deficits. Psych: Normal affect. Responds appropriately.  Assessment:    1. POTS (postural orthostatic tachycardia syndrome)   2. Neurocardiogenic syncope   3. Chest discomfort   4. Lower extremity edema   5. Granulomatosis with polyangiitis, unspecified whether renal involvement (HCC)   6. Obstructive sleep apnea   7. Type 2 diabetes mellitus with obesity (HCC)     Plan:    Neurocardiogenic Syncope/POTS  Patient had 2 syncopal episodes in November and was hospitalized [facility] for this. Found to be severely hypokalemic at that time. Patient previously on Florinef, Midodrine, and Northera. At somepoint Midodrine was discontinued (patient cannot remember exactly when or for what reason). Florinef recently stopped by Endocrinology due to concern for secondary/tertiary adrenal insufficiency and recurrent hypokalemia. Patient has not been taking Northera for a while due to cost but states her insurance will now cover it completely and would like to restart. Patient has actually been doing fairly well since recent admission. No recurrent  syncopal episodes and symptoms seem to be stable right now. BP 122/86 today while sitting and 105/76 while supine. Discussed with Pharmacy about restarting Northera.  They will re-submit pre-authorization and then restart. Will likely need to restart at 100 mg three times daily and titrate up. Continue compression stocking, staying hydrated, and salt liberation.   Chest Discomfort Patient reports some substernal chest burning that radiates up neck. She states pain seems to be worse before she eats but then improves with reflux medications. She states she is not very active so difficult to assess whether she is having any exertional symptoms. Patient would like refill of Protonix because she thinks this helps. Discussed need for possible stress test. Patient would like to try Protonix first. Will give refill today. Emphasized the importance of letting us know if chest discomfort does not improve with Protonix.   Lower Extremity Edema Patient reports worsening lower extremity edema recently as well as 15 lbs weight gain in the last week. She does have lower extremity edema on exam but otherwise does not appear significantly volume overloaded. Lungs clear. Would not recommend diuretics given neurocardiogenic syncope. Recommend compression stockings. Will need to carefully balance sodium intake - typically recommend salt liberation with neurocardiogenic syncope but may need to be careful if she continues to retain fluid with this. Patient has been referred to Nephrology and is hoping to see them in the next couple of weeks. I think this would be helpful.   Wegener's Granulomatosis  On Prednisone  daily. Managed by Rheumatology and Dermatology.  Obstructive Sleep Apnea She has not been able to tolerate the CPAP or BiPAP masks. Scheduled to see Pulmonology in January 2021.  Diabetes Mellitus  Managed by Endocrinology.  Disposition: Follow up in 3 months with Dr. Swaziland.   Medication Adjustments/Labs  and Tests Ordered: Current medicines are reviewed at length with the patient today.  Concerns regarding medicines are outlined above.  No orders of the defined types were placed in this encounter.  Meds ordered this encounter  Medications  . pantoprazole (PROTONIX) 40 MG tablet    Sig: Take 1 tablet (40 mg total) by mouth daily.    Dispense:  90 tablet    Refill:  3    Patient Instructions  Medication Instructions:  Marjie Skiff, PA has recommended making the following medication changes: 1. REFILL Protonix (Pantoprazole) - please let us know if the burning in your chest continues  *If you need a refill on your cardiac medications before your next appointment, please call your pharmacy*  Follow-Up: At [facility], you and your health needs are our priority.  As part of our continuing mission to provide you with exceptional heart care, we have created designated Provider Care Teams.  These Care Teams include your primary Cardiologist (physician) and Advanced Practice Providers (APPs -  Physician Assistants and Nurse Practitioners) who all work together to provide you with the care you need, when you need it.  Your next appointment:   3 month(s)  The format for your next appointment:   In Person  Provider:   You may see Peter Swaziland, MD or one of the following Advanced Practice Providers on your designated Care Team:    Azalee Course, PA-C  Micah Flesher, PA-C or   Judy Pimple, PA-C    Signed, Corrin Parker, [place]  10/14/2019 12:40 PM    [facility]

## 2019-10-14 ENCOUNTER — Ambulatory Visit: Payer: BC Managed Care – PPO | Admitting: Student

## 2019-10-14 ENCOUNTER — Encounter (INDEPENDENT_AMBULATORY_CARE_PROVIDER_SITE_OTHER): Payer: Self-pay

## 2019-10-14 ENCOUNTER — Other Ambulatory Visit: Payer: Self-pay

## 2019-10-14 ENCOUNTER — Encounter: Payer: Self-pay | Admitting: Physician Assistant

## 2019-10-14 ENCOUNTER — Other Ambulatory Visit: Payer: Self-pay | Admitting: Pharmacist Clinician (PhC)/ Clinical Pharmacy Specialist

## 2019-10-14 VITALS — BP 105/76 | HR 74 | Ht 66.5 in | Wt 218.4 lb

## 2019-10-14 DIAGNOSIS — R600 Localized edema: Secondary | ICD-10-CM | POA: Diagnosis not present

## 2019-10-14 DIAGNOSIS — I498 Other specified cardiac arrhythmias: Secondary | ICD-10-CM | POA: Diagnosis not present

## 2019-10-14 DIAGNOSIS — R0789 Other chest pain: Secondary | ICD-10-CM | POA: Diagnosis not present

## 2019-10-14 DIAGNOSIS — M313 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: Secondary | ICD-10-CM

## 2019-10-14 DIAGNOSIS — G90A Postural orthostatic tachycardia syndrome (POTS): Secondary | ICD-10-CM

## 2019-10-14 DIAGNOSIS — E669 Obesity, unspecified: Secondary | ICD-10-CM

## 2019-10-14 DIAGNOSIS — R55 Syncope and collapse: Secondary | ICD-10-CM

## 2019-10-14 DIAGNOSIS — G4733 Obstructive sleep apnea (adult) (pediatric): Secondary | ICD-10-CM

## 2019-10-14 DIAGNOSIS — E1169 Type 2 diabetes mellitus with other specified complication: Secondary | ICD-10-CM

## 2019-10-14 MED ORDER — PANTOPRAZOLE SODIUM 40 MG PO TBEC: 40.0000 mg | DELAYED_RELEASE_TABLET | Freq: Every day | ORAL | 3 refills | Status: DC

## 2019-10-14 MED ORDER — DROXIDOPA 100 MG PO CAPS: ORAL_CAPSULE | ORAL | 1 refills | Status: DC

## 2019-10-14 NOTE — Patient Instructions (Signed)
Medication Instructions:  Stacey Rives, PA has recommended making the following medication changes: 1. REFILL Protonix (Pantoprazole) - please let us know if the burning in your chest continues  *If you need a refill on your cardiac medications before your next appointment, please call your pharmacy*  Follow-Up: At [facility], you and your health needs are our priority.  As part of our continuing mission to provide you with exceptional heart care, we have created designated Provider Care Teams.  These Care Teams include your primary Cardiologist (physician) and Advanced Practice Providers (APPs -  Physician Assistants and Nurse Practitioners) who all work together to provide you with the care you need, when you need it.  Your next appointment:   3 month(s)  The format for your next appointment:   In Person  Provider:   You may see Peter Martinique, MD or one of the following Advanced Practice Providers on your designated Care Team:    Almyra Deforest, PA-C  Fabian Sharp, PA-C or   Roby Lofts, [place]

## 2019-10-15 ENCOUNTER — Telehealth: Payer: Self-pay | Admitting: Student

## 2019-10-15 NOTE — Telephone Encounter (Signed)
Spoke with [name] who recommended patient call Northera support number on website to see if they had any other options for patient assistance. Called and updated patient with this information. She states she would call them and let us know what she finds out.

## 2019-10-15 NOTE — Telephone Encounter (Signed)
These very spendy drugs are only filled thru specialty pharmacies, and her insurance requires it be filled at [facility].  Doesn't matter which site we send it to, she'll have to pay her outstanding bill.  Other specialty pharmacies won't be able to bill her insurance.

## 2019-10-15 NOTE — Telephone Encounter (Signed)
   Patient called our office requesting Northera be sent to another pharmacy. Northera was sent to [facility] in [place] after visit yesterday. However, patient received a call from [facility] and was told they would not refill any other medications until patient pays outstanding balance. Will route note to Tommy Medal, PharmD, to see if she can help with this.  Darreld Mclean, PA-C 10/15/2019 4:27 PM

## 2019-10-21 NOTE — Addendum Note (Signed)
Addended by: Venetia Maxon on: 10/21/2019 10:05 AM   Modules accepted: Orders

## 2020-01-07 NOTE — Progress Notes (Signed)
Virtual Visit via Telephone Note   This visit type was conducted due to national recommendations for restrictions regarding the COVID-19 Pandemic (e.g. social distancing) in an effort to limit this patient's exposure and mitigate transmission in our community.  Due to her co-morbid illnesses, this patient is at least at moderate risk for complications without adequate follow up.  This format is felt to be most appropriate for this patient at this time.  The patient did not have access to video technology/had technical difficulties with video requiring transitioning to audio format only (telephone).  All issues noted in this document were discussed and addressed.  No physical exam could be performed with this format.  Please refer to the patient's chart for her  consent to telehealth for [facility].   The patient was identified using 2 identifiers.  Date:  01/12/2020   ID:  Gregary Cromer, DOB 22-Oct-1966, MRN [id number]  Patient Location: Home Provider Location: Home  PCP:  Elizabeth Palau, [name]  Cardiologist:  Miracle Mongillo [place], MD  Electrophysiologist:  None   Evaluation Performed:  Follow-Up Visit  Chief Complaint:  Orthostatic hypotension/POTS  History of Present Illness:    [name] is a 54 y.o. female with a history of autonomic dysfunction/POTS/neurocardiogenic syncope, diabetes mellitus, obstructive sleep apnea unable to tolerate CPAP or BiPAP mask due to her granulomatosis, Wegener's granulomatosis, neuropathy with chronic pain, and CKD stage III.  Patient has history of hypotension with many years with symptoms of dizziness, fatigue, lethargy, and inability to focus. She has had multiple falls with injuries. Prior cardiac work-up includes a normal Echo at [facility] in 2012. She also had a Tilt Table Test in 07/2013 and has been treated for neurocardiogenic syncope with chronic Florinef. In 2015, she had a normal stress Echo but with poor functional status at [facility]. Symptoms  persisted despite Florinef; therefore, patient was also placed on Midodrine. She was seen by Dr. Ladona Ridgel in 01/2018 and it was felt like she had classic neurocardiogenic syncope symptoms with autonomic dysfunction. Continued medical and lifestyle modification was recommended. Symptoms persisted on both Florinef and Midodrine so she was seen by our Pharm D in 03/2018 and started on Northera. This was briefly held after patient developed severe leg fatigue and weakness. It was later felt that this was due to a flair of her Wegener's disease and Northera was able to be resumed.  Symptoms did improve significantly after starting Northera.   She was admitted in 12/2018 with respiratory failure which was felt to be due to granulomatosis/polyangitis. She was treated with steroids. Also started on antibiotics due to fever and extensive skin lesions. Echo during this admission showed LVEF of 65-70% with normal wall motion and diastolic function.  Patient was seen in 03/2019 at which time patient reported she was not able to take Northera due to cost. It was going to cost her $1600/month.  Sometime prior to this visit it looks like her Midodrine was stopped.   She was admitted from 09/10/2019 to 09/15/2019 for bilateral lower toe infection and significant hypokalemia. Following discharge, patient was seen by Dr. 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 He was concerned that Florinef continues to put her at risk for hypokalemia even after lowering the dose. Therefore, Florinef was stopped.    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  She does have recurrent severe hypokalemia.   She reports she still has symptoms of orthostatic dizziness and syncope. She will develop  lightheadedness and sweating. If she doesn't get a hold of something or sit down she will pass out. No injuries. She does note increased swelling in her legs over the past 3 weeks. Just recently went on a trip to [place] for her anniversary. Is not wearing support hose. Weight is stable and she has made a resolution to lose weight. Patient has known sleep apnea but has not been able to tolerate CPAP or BiPAP masks.  The patient does not have symptoms concerning for COVID-19 infection (fever, chills, cough, or new shortness of breath). She has had her first vaccine shot.    Past Medical History:  Diagnosis Date  . Chronic cough   . Diabetes mellitus without complication ([place])   . Hypertension   . OSA (obstructive sleep apnea)   . Peripheral neuropathy   . Wegener's granulomatosis ([place]) 2009  . Wegner's disease (congenital syphilitic osteochondritis)    Past Surgical History:  Procedure Laterality Date  . APPENDECTOMY    . BACK SURGERY    . CHOLECYSTECTOMY    . COLONOSCOPY N/A 06/02/2013   Procedure: COLONOSCOPY;  Surgeon: Wonda Horner, MD;  Location: [facility] ENDOSCOPY;  Service: Endoscopy;  Laterality: N/A;  . HERNIA REPAIR  2000  . INCISION AND DRAINAGE ABSCESS Left 07/07/2016   Procedure: DEBRIDMENT LEFT THIGH ABSCESS, EXISION ACUTE SKIN RASH LEFT THIGH(1CM LESION);  Surgeon: Fanny Skates, MD;  Location: [facility] ORS;  Service: General;  Laterality: Left;  . SEPTOPLASTY  02/2013  . spleen anuyism    . splenic aneurysm    . VAGINAL HYSTERECTOMY       Current Meds  Medication Sig  . acetaminophen (TYLENOL) 325 MG tablet Take 2 tablets (650 mg total) by mouth every 6 (six) hours as needed for mild pain (or Fever >/= 101).  Marland Kitchen albuterol (PROVENTIL HFA;VENTOLIN HFA) 108 (90 Base) MCG/ACT inhaler Inhale 2 puffs into the lungs every 6 (six) hours as needed for wheezing or shortness of breath.  Marland Kitchen b complex vitamins tablet Take 1 tablet by mouth daily.  . Calcium Carb-Cholecalciferol (CALCIUM  CARBONATE-VITAMIN D3 PO) Take 1 tablet by mouth daily.  Marland Kitchen dextromethorphan-guaiFENesin (MUCINEX DM) 30-600 MG 12hr tablet Take 1 tablet by mouth 2 (two) times daily as needed for cough.  . Droxidopa 100 MG CAPS Start with 1 capsule three times daily.  Increase by 1 capsule every 5-7 days until taking 3 capsules three times daily as directed  . DULoxetine (CYMBALTA) 60 MG capsule Take 60 mg by mouth 2 (two) times daily.  . famotidine (PEPCID) 20 MG tablet Take 20 mg daily  . ferrous sulfate 325 (65 FE) MG tablet Take 325 mg by mouth daily with breakfast.  . fludrocortisone (FLORINEF) 0.1 MG tablet TAKE 2 TABLETS BY MOUTH EVERY DAY  . fluticasone (FLONASE) 50 MCG/ACT nasal spray Place 2 sprays into both nostrils daily as needed for rhinitis.  . folic acid (FOLVITE) 1 MG tablet Take 1 mg by mouth daily.  . furosemide (LASIX) 40 MG tablet Take 40 mg by mouth as directed.   . gabapentin (NEURONTIN) 300 MG capsule Take 3 capsules (900 mg total) by mouth in the morning, 2 capsules (600 mg total) in the early afternoon, and 4 capsules (1200 mg total) in the evening.  . lidocaine (LIDODERM) 5 % Place 1 patch onto the skin daily as needed (for pain). Remove &  Discard patch within 12 hours or as directed by MD  . magnesium oxide (MAG-OX) 400 MG tablet Take 400 mg by mouth 2 (two) times daily.  . metFORMIN (GLUCOPHAGE) 500 MG tablet Take 500 mg by mouth 2 (two) times daily with a meal.  . methotrexate 50 MG/2ML injection Inject 1 mL into the skin once a week.   . montelukast (SINGULAIR) 10 MG tablet Take 10 mg by mouth daily.  . Potassium Chloride Crys ER (K-DUR PO) Take 40 mEq by mouth 3 (three) times daily.  . predniSONE (DELTASONE) 5 MG tablet Take 5 mg by mouth daily with breakfast.  . PRESCRIPTION MEDICATION by Intrathecal route as directed. ziconotide (PF) 25 mcg/mL, BUPivacaine (PF) 18 mg/mL 20 mL intrathecal pump refill  . sulfamethoxazole-trimethoprim (BACTRIM,SEPTRA) 400-80 MG tablet TAKE 1 TABLET 3  TIMES A WEEK  . tapentadol (NUCYNTA) 50 MG tablet Take 50 mg by mouth 2 (two) times daily.  . tizanidine (ZANAFLEX) 2 MG capsule TAKE ONE CAPSULE BY MOUTH 3 TIMES A DAY AS NEEDED FOR MUSCLE SPASMS  . topiramate (TOPAMAX) 100 MG tablet Take 100 mg by mouth daily.  . traZODone (DESYREL) 100 MG tablet Take 1 tablet (100 mg total) by mouth at bedtime.  . vitamin B-12 (CYANOCOBALAMIN) 1000 MCG tablet Take 1,000 mcg by mouth daily.  . Vitamin D, Ergocalciferol, (DRISDOL) 50000 units CAPS capsule Take 50,000 Units by mouth every 7 (seven) days. Pt takes on Friday.  Marland Kitchen ZICONOTIDE ACETATE IT by Intrathecal route every 3 (three) months.     Allergies:   Tapentadol, Ibuprofen, and Penicillins   Social History   Tobacco Use  . Smoking status: Never Smoker  . Smokeless tobacco: Never Used  Substance Use Topics  . Alcohol use: Yes    Comment: occasionally  . Drug use: No     Family Hx: The patient's family history includes Diabetes in her father and mother; Heart disease in her mother.  ROS:   Please see the history of present illness.    All other systems reviewed and are negative.   Prior CV studies:   The following studies were reviewed today:  Echo 12/27/18:Interpretation Summary A complete portable two-dimensional transthoracic echocardiogram with color flow Doppler and Spectral Doppler was performed. The study was technically difficult. There is no comparison study available.  The left ventricle is normal in size. Proximal septal thickening is noted. The left ventricular ejection fraction is normal (65-70%). The left ventricular wall motion is normal. The left ventricular diastolic function is normal. The right ventricle is grossly normal in size and function. The left and right atria are normal size. Estimation of right ventricular systolic pressure is not possible. The aortic valve is a normal trileaflet valve with no regurgitation. There is no pericardial effusion.   Labs/Other Tests and Data Reviewed:    EKG:  No ECG reviewed.  Recent Labs: No results found for requested labs within last 8760 hours.   Recent Lipid Panel No results found for: CHOL, TRIG, HDL, CHOLHDL, LDLCALC, LDLDIRECT  Wt Readings from Last 3 Encounters:  10/14/19 218 lb 6.4 oz (99.1 kg)  03/25/19 233 lb (105.7 kg)  07/24/18 215 lb 12.8 oz (97.9 kg)    Labs dated 01/01/20: potassium 3.4. sodium 145. Creatinine 0.93. glucose 101.  Objective:    Vital Signs:  There were no vitals taken for this visit.   VITAL SIGNS:  reviewed  ASSESSMENT & PLAN:    1.  Neurocardiogenic syncope/ POTS. Classic symptoms/ findings. Unfortunately unable to  take Florinef.  Northera resulted in significant clinical benefit but she is unable to afford it. Will check with Pharm D to see if she would qualify for any patient assistance.  Liberalize sodium intake and avoid diuretics. Encouraged her to wear compression hose daily.  2. OSA. difficulty wearing mask due to her granulomatosis. 3. Obesity- encourage weight loss.  4. Prediabetes.  5. Wegener's granulomatosis on chronic steroids and methotrexate. 6. Peripheral neuropathy.  COVID-19 Education: The signs and symptoms of COVID-19 were discussed with the patient and how to seek care for testing (follow up with PCP or arrange E-visit).  The importance of social distancing was discussed today.  Time:   Today, I have spent 15 minutes with the patient with telehealth technology discussing the above problems.     Medication Adjustments/Labs and Tests Ordered: Current medicines are reviewed at length with the patient today.  Concerns regarding medicines are outlined above.   Tests Ordered: No orders of the defined types were placed in this encounter.   Medication Changes: No orders of the defined types were placed in this encounter.   Follow Up:  In Person in 6 month(s)  Signed, Jonte Shiller [place], MD  01/12/2020 8:50 AM    [facility]

## 2020-01-12 ENCOUNTER — Ambulatory Visit: Payer: BC Managed Care – PPO | Admitting: Cardiology

## 2020-01-12 ENCOUNTER — Encounter: Payer: Self-pay | Admitting: Cardiology

## 2020-01-12 ENCOUNTER — Telehealth (INDEPENDENT_AMBULATORY_CARE_PROVIDER_SITE_OTHER): Payer: BC Managed Care – PPO | Admitting: Cardiology

## 2020-01-12 ENCOUNTER — Ambulatory Visit: Payer: BC Managed Care – PPO

## 2020-01-12 DIAGNOSIS — G4733 Obstructive sleep apnea (adult) (pediatric): Secondary | ICD-10-CM

## 2020-01-12 DIAGNOSIS — R55 Syncope and collapse: Secondary | ICD-10-CM | POA: Diagnosis not present

## 2020-01-12 DIAGNOSIS — G629 Polyneuropathy, unspecified: Secondary | ICD-10-CM

## 2020-01-12 DIAGNOSIS — E669 Obesity, unspecified: Secondary | ICD-10-CM | POA: Diagnosis not present

## 2020-01-12 DIAGNOSIS — R600 Localized edema: Secondary | ICD-10-CM

## 2020-01-12 DIAGNOSIS — R7303 Prediabetes: Secondary | ICD-10-CM

## 2020-01-12 DIAGNOSIS — I498 Other specified cardiac arrhythmias: Secondary | ICD-10-CM

## 2020-01-12 DIAGNOSIS — M313 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: Secondary | ICD-10-CM

## 2020-01-12 DIAGNOSIS — G90A Postural orthostatic tachycardia syndrome (POTS): Secondary | ICD-10-CM

## 2020-01-12 NOTE — Patient Instructions (Signed)
Medication Instructions:  Continue same medications *If you need a refill on your cardiac medications before your next appointment, please call your pharmacy*   Lab Work: None ordered   Testing/Procedures: None ordered   Follow-Up: At [facility], you and your health needs are our priority.  As part of our continuing mission to provide you with exceptional heart care, we have created designated Provider Care Teams.  These Care Teams include your primary Cardiologist (physician) and Advanced Practice Providers (APPs -  Physician Assistants and Nurse Practitioners) who all work together to provide you with the care you need, when you need it.  We recommend signing up for the patient portal called "MyChart".  Sign up information is provided on this After Visit Summary.  MyChart is used to connect with patients for Virtual Visits (Telemedicine).  Patients are able to view lab/test results, encounter notes, upcoming appointments, etc.  Non-urgent messages can be sent to your provider as well.   To learn more about what you can do with MyChart, go to [email or web address].    Your next appointment:  6 months     ( Call in June to schedule Sept appointment )   The format for your next appointment: Office     Provider:  Dr.Jordan  

## 2020-01-13 ENCOUNTER — Telehealth: Payer: Self-pay

## 2020-01-13 NOTE — Telephone Encounter (Signed)
Spoke to patient our pharmacist Belenda Cruise advised her to call Northera at [phone] for assistance with cost.

## 2020-04-02 ENCOUNTER — Other Ambulatory Visit: Payer: Self-pay | Admitting: Cardiology

## 2020-08-09 ENCOUNTER — Other Ambulatory Visit (HOSPITAL_COMMUNITY): Payer: Self-pay | Admitting: Orthopedic Surgery

## 2020-08-09 NOTE — Progress Notes (Signed)
Chart reviewed with Dr Hyacinth Meeker, pt OK to be done at [facility].

## 2020-08-10 ENCOUNTER — Other Ambulatory Visit (HOSPITAL_COMMUNITY)
Admission: RE | Admit: 2020-08-10 | Discharge: 2020-08-10 | Disposition: A | Discharge status: Home / Self Care | Payer: BC Managed Care – PPO | Source: Ambulatory Visit | Attending: Orthopedic Surgery | Admitting: Orthopedic Surgery

## 2020-08-10 ENCOUNTER — Encounter (HOSPITAL_BASED_OUTPATIENT_CLINIC_OR_DEPARTMENT_OTHER): Payer: Self-pay | Admitting: Orthopedic Surgery

## 2020-08-10 ENCOUNTER — Other Ambulatory Visit: Payer: Self-pay

## 2020-08-10 ENCOUNTER — Encounter (HOSPITAL_BASED_OUTPATIENT_CLINIC_OR_DEPARTMENT_OTHER)
Admission: RE | Admit: 2020-08-10 | Discharge: 2020-08-10 | Disposition: A | Discharge status: Home / Self Care | Payer: BC Managed Care – PPO | Source: Ambulatory Visit | Attending: Orthopedic Surgery | Admitting: Orthopedic Surgery

## 2020-08-10 DIAGNOSIS — Z01812 Encounter for preprocedural laboratory examination: Secondary | ICD-10-CM | POA: Insufficient documentation

## 2020-08-10 DIAGNOSIS — Z20822 Contact with and (suspected) exposure to covid-19: Secondary | ICD-10-CM | POA: Insufficient documentation

## 2020-08-10 LAB — BASIC METABOLIC PANEL
Anion gap: 11 (ref 5–15)
BUN: 22 mg/dL — ABNORMAL HIGH (ref 6–20)
CO2: 26 mmol/L (ref 22–32)
Calcium: 8.7 mg/dL — ABNORMAL LOW (ref 8.9–10.3)
Chloride: 107 mmol/L (ref 98–111)
Creatinine, Ser: 0.98 mg/dL (ref 0.44–1.00)
GFR, Estimated: >60 mL/min (ref >60)
Glucose, Bld: 112 mg/dL — ABNORMAL HIGH (ref 70–99)
Potassium: 3.6 mmol/L (ref 3.5–5.1)
Sodium: 144 mmol/L (ref 135–145)

## 2020-08-10 LAB — SARS CORONAVIRUS 2 (TAT 6-24 HRS): SARS Coronavirus 2: NEGATIVE

## 2020-08-10 NOTE — Progress Notes (Signed)

## 2020-08-12 ENCOUNTER — Ambulatory Visit (HOSPITAL_BASED_OUTPATIENT_CLINIC_OR_DEPARTMENT_OTHER): Payer: BC Managed Care – PPO | Admitting: Certified Registered"

## 2020-08-12 ENCOUNTER — Other Ambulatory Visit: Payer: Self-pay

## 2020-08-12 ENCOUNTER — Encounter (HOSPITAL_BASED_OUTPATIENT_CLINIC_OR_DEPARTMENT_OTHER)
Admission: RE | Disposition: A | Discharge status: Home / Self Care | Payer: Self-pay | Source: Home / Self Care | Attending: Orthopedic Surgery

## 2020-08-12 ENCOUNTER — Ambulatory Visit (HOSPITAL_BASED_OUTPATIENT_CLINIC_OR_DEPARTMENT_OTHER)
Admission: RE | Admit: 2020-08-12 | Discharge: 2020-08-12 | Disposition: A | Discharge status: Home / Self Care | Payer: BC Managed Care – PPO | Attending: Orthopedic Surgery | Admitting: Orthopedic Surgery

## 2020-08-12 DIAGNOSIS — L97519 Non-pressure chronic ulcer of other part of right foot with unspecified severity: Secondary | ICD-10-CM | POA: Diagnosis not present

## 2020-08-12 DIAGNOSIS — G4730 Sleep apnea, unspecified: Secondary | ICD-10-CM | POA: Insufficient documentation

## 2020-08-12 DIAGNOSIS — Z7984 Long term (current) use of oral hypoglycemic drugs: Secondary | ICD-10-CM | POA: Insufficient documentation

## 2020-08-12 DIAGNOSIS — G4733 Obstructive sleep apnea (adult) (pediatric): Secondary | ICD-10-CM | POA: Diagnosis not present

## 2020-08-12 DIAGNOSIS — I10 Essential (primary) hypertension: Secondary | ICD-10-CM | POA: Diagnosis not present

## 2020-08-12 DIAGNOSIS — Z79899 Other long term (current) drug therapy: Secondary | ICD-10-CM | POA: Diagnosis not present

## 2020-08-12 DIAGNOSIS — K219 Gastro-esophageal reflux disease without esophagitis: Secondary | ICD-10-CM | POA: Insufficient documentation

## 2020-08-12 DIAGNOSIS — M869 Osteomyelitis, unspecified: Secondary | ICD-10-CM | POA: Diagnosis not present

## 2020-08-12 DIAGNOSIS — G629 Polyneuropathy, unspecified: Secondary | ICD-10-CM | POA: Insufficient documentation

## 2020-08-12 DIAGNOSIS — L97526 Non-pressure chronic ulcer of other part of left foot with bone involvement without evidence of necrosis: Secondary | ICD-10-CM | POA: Diagnosis not present

## 2020-08-12 DIAGNOSIS — Z6832 Body mass index (BMI) 32.0-32.9, adult: Secondary | ICD-10-CM | POA: Insufficient documentation

## 2020-08-12 DIAGNOSIS — Z79891 Long term (current) use of opiate analgesic: Secondary | ICD-10-CM | POA: Insufficient documentation

## 2020-08-12 DIAGNOSIS — G8929 Other chronic pain: Secondary | ICD-10-CM | POA: Diagnosis not present

## 2020-08-12 DIAGNOSIS — Z7952 Long term (current) use of systemic steroids: Secondary | ICD-10-CM | POA: Insufficient documentation

## 2020-08-12 DIAGNOSIS — E1169 Type 2 diabetes mellitus with other specified complication: Secondary | ICD-10-CM | POA: Insufficient documentation

## 2020-08-12 DIAGNOSIS — E11621 Type 2 diabetes mellitus with foot ulcer: Secondary | ICD-10-CM | POA: Diagnosis not present

## 2020-08-12 DIAGNOSIS — E669 Obesity, unspecified: Secondary | ICD-10-CM | POA: Insufficient documentation

## 2020-08-12 DIAGNOSIS — L97516 Non-pressure chronic ulcer of other part of right foot with bone involvement without evidence of necrosis: Secondary | ICD-10-CM | POA: Diagnosis not present

## 2020-08-12 DIAGNOSIS — L97524 Non-pressure chronic ulcer of other part of left foot with necrosis of bone: Secondary | ICD-10-CM

## 2020-08-12 DIAGNOSIS — E08621 Diabetes mellitus due to underlying condition with foot ulcer: Secondary | ICD-10-CM

## 2020-08-12 HISTORY — PX: AMPUTATION TOE: SHX6595

## 2020-08-12 HISTORY — DX: Hypothyroidism, unspecified: E03.9

## 2020-08-12 HISTORY — DX: Other chronic pain: G89.29

## 2020-08-12 HISTORY — DX: Presence of other specified devices: Z97.8

## 2020-08-12 HISTORY — DX: Hypokalemia: E87.6

## 2020-08-12 HISTORY — DX: Dyspnea, unspecified: R06.00

## 2020-08-12 HISTORY — DX: Syncope and collapse: R55

## 2020-08-12 HISTORY — DX: Osteomyelitis, unspecified: M86.9

## 2020-08-12 HISTORY — DX: Gastro-esophageal reflux disease without esophagitis: K21.9

## 2020-08-12 LAB — GLUCOSE, CAPILLARY
Glucose-Capillary: 107 mg/dL — ABNORMAL HIGH (ref 70–99)
Glucose-Capillary: 138 mg/dL — ABNORMAL HIGH (ref 70–99)

## 2020-08-12 SURGERY — AMPUTATION, TOE
Anesthesia: General | Site: Toe | Laterality: Bilateral

## 2020-08-12 MED ORDER — SODIUM CHLORIDE 0.9 % IV SOLN: INTRAVENOUS | Status: DC

## 2020-08-12 MED ORDER — VANCOMYCIN HCL 500 MG IV SOLR
INTRAVENOUS | Status: DC | PRN
  Administered 2020-08-12: 500 mg via TOPICAL

## 2020-08-12 MED ORDER — DEXAMETHASONE SODIUM PHOSPHATE 10 MG/ML IJ SOLN
INTRAMUSCULAR | Status: DC | PRN
  Administered 2020-08-12: 10 mg via INTRAVENOUS

## 2020-08-12 MED ORDER — PHENYLEPHRINE HCL (PRESSORS) 10 MG/ML IV SOLN
INTRAVENOUS | Status: DC | PRN
  Administered 2020-08-12: 120 ug via INTRAVENOUS
  Administered 2020-08-12: 80 ug via INTRAVENOUS
  Administered 2020-08-12: 120 ug via INTRAVENOUS
  Administered 2020-08-12: 80 ug via INTRAVENOUS
  Administered 2020-08-12: 120 ug via INTRAVENOUS

## 2020-08-12 MED ORDER — FENTANYL CITRATE (PF) 100 MCG/2ML IJ SOLN
INTRAMUSCULAR | Status: AC
  Filled 2020-08-12: qty 2

## 2020-08-12 MED ORDER — ONDANSETRON HCL 4 MG/2ML IJ SOLN
INTRAMUSCULAR | Status: DC | PRN
  Administered 2020-08-12: 4 mg via INTRAVENOUS

## 2020-08-12 MED ORDER — PROPOFOL 10 MG/ML IV BOLUS
INTRAVENOUS | Status: DC | PRN
  Administered 2020-08-12: 150 mg via INTRAVENOUS

## 2020-08-12 MED ORDER — CEFAZOLIN SODIUM-DEXTROSE 2-4 GM/100ML-% IV SOLN
INTRAVENOUS | Status: AC
  Filled 2020-08-12: qty 100

## 2020-08-12 MED ORDER — MIDAZOLAM HCL 2 MG/2ML IJ SOLN
INTRAMUSCULAR | Status: DC | PRN
  Administered 2020-08-12: 2 mg via INTRAVENOUS

## 2020-08-12 MED ORDER — CEFAZOLIN SODIUM-DEXTROSE 2-3 GM-%(50ML) IV SOLR
INTRAVENOUS | Status: DC | PRN
  Administered 2020-08-12: 2 g via INTRAVENOUS

## 2020-08-12 MED ORDER — ONDANSETRON HCL 4 MG/2ML IJ SOLN
INTRAMUSCULAR | Status: AC
  Filled 2020-08-12: qty 2

## 2020-08-12 MED ORDER — PROPOFOL 10 MG/ML IV BOLUS
INTRAVENOUS | Status: AC
  Filled 2020-08-12: qty 20

## 2020-08-12 MED ORDER — HYDROMORPHONE HCL 1 MG/ML IJ SOLN
INTRAMUSCULAR | Status: AC
  Filled 2020-08-12: qty 0.5

## 2020-08-12 MED ORDER — HYDROMORPHONE HCL 1 MG/ML IJ SOLN
0.2500 mg | INTRAMUSCULAR | Status: DC | PRN
  Administered 2020-08-12: 0.5 mg via INTRAVENOUS

## 2020-08-12 MED ORDER — PROMETHAZINE HCL 25 MG/ML IJ SOLN: 6.2500 mg | INTRAMUSCULAR | Status: DC | PRN

## 2020-08-12 MED ORDER — LACTATED RINGERS IV SOLN: INTRAVENOUS | Status: DC | PRN

## 2020-08-12 MED ORDER — OXYCODONE HCL 5 MG/5ML PO SOLN: 5.0000 mg | Freq: Once | ORAL | Status: DC | PRN

## 2020-08-12 MED ORDER — OXYCODONE HCL 5 MG PO TABS: 5.0000 mg | ORAL_TABLET | Freq: Once | ORAL | Status: DC | PRN

## 2020-08-12 MED ORDER — BUPIVACAINE HCL (PF) 0.25 % IJ SOLN
INTRAMUSCULAR | Status: AC
  Filled 2020-08-12: qty 30

## 2020-08-12 MED ORDER — LACTATED RINGERS IV SOLN: INTRAVENOUS | Status: DC

## 2020-08-12 MED ORDER — MIDAZOLAM HCL 2 MG/2ML IJ SOLN
INTRAMUSCULAR | Status: AC
  Filled 2020-08-12: qty 2

## 2020-08-12 MED ORDER — CEFAZOLIN SODIUM-DEXTROSE 2-4 GM/100ML-% IV SOLN: 2.0000 g | INTRAVENOUS | Status: DC

## 2020-08-12 MED ORDER — AMISULPRIDE (ANTIEMETIC) 5 MG/2ML IV SOLN: 10.0000 mg | Freq: Once | INTRAVENOUS | Status: DC | PRN

## 2020-08-12 MED ORDER — DEXAMETHASONE SODIUM PHOSPHATE 10 MG/ML IJ SOLN
INTRAMUSCULAR | Status: AC
  Filled 2020-08-12: qty 1

## 2020-08-12 MED ORDER — FENTANYL CITRATE (PF) 100 MCG/2ML IJ SOLN
INTRAMUSCULAR | Status: DC | PRN
  Administered 2020-08-12: 50 ug via INTRAVENOUS

## 2020-08-12 MED ORDER — BUPIVACAINE HCL (PF) 0.5 % IJ SOLN
INTRAMUSCULAR | Status: AC
  Filled 2020-08-12: qty 30

## 2020-08-12 MED ORDER — BUPIVACAINE-EPINEPHRINE 0.5% -1:200000 IJ SOLN
INTRAMUSCULAR | Status: DC | PRN
  Administered 2020-08-12: 10 mL

## 2020-08-12 MED ORDER — EPHEDRINE SULFATE 50 MG/ML IJ SOLN
INTRAMUSCULAR | Status: DC | PRN
  Administered 2020-08-12 (×4): 15 mg via INTRAVENOUS

## 2020-08-12 SURGICAL SUPPLY — 62 items
APL PRP STRL LF DISP 70% ISPRP (MISCELLANEOUS)
BLADE AVERAGE 25X9 (BLADE) IMPLANT
BLADE OSC/SAG .038X5.5 CUT EDG (BLADE) IMPLANT
BLADE SURG 10 STRL SS (BLADE) IMPLANT
BLADE SURG 15 STRL LF DISP TIS (BLADE) ×2 IMPLANT
BLADE SURG 15 STRL SS (BLADE) ×4
BNDG CMPR 9X4 STRL LF SNTH (GAUZE/BANDAGES/DRESSINGS) ×1
BNDG COHESIVE 4X5 TAN STRL (GAUZE/BANDAGES/DRESSINGS) ×4 IMPLANT
BNDG CONFORM 3 STRL LF (GAUZE/BANDAGES/DRESSINGS) IMPLANT
BNDG ESMARK 4X9 LF (GAUZE/BANDAGES/DRESSINGS) ×2 IMPLANT
CHLORAPREP W/TINT 26 (MISCELLANEOUS) IMPLANT
COVER BACK TABLE 60X90IN (DRAPES) ×2 IMPLANT
COVER WAND RF STERILE (DRAPES) IMPLANT
CUFF TOURN SGL QUICK 24 (TOURNIQUET CUFF)
CUFF TRNQT CYL 24X4X16.5-23 (TOURNIQUET CUFF) IMPLANT
DECANTER SPIKE VIAL GLASS SM (MISCELLANEOUS) IMPLANT
DRAPE EXTREMITY T 121X128X90 (DISPOSABLE) ×4 IMPLANT
DRAPE SURG 17X23 STRL (DRAPES) IMPLANT
DRAPE U-SHAPE 47X51 STRL (DRAPES) ×4 IMPLANT
DRSG EMULSION OIL 3X3 NADH (GAUZE/BANDAGES/DRESSINGS) IMPLANT
DRSG MEPITEL 4X7.2 (GAUZE/BANDAGES/DRESSINGS) ×2 IMPLANT
DRSG PAD ABDOMINAL 8X10 ST (GAUZE/BANDAGES/DRESSINGS) IMPLANT
ELECT REM PT RETURN 9FT ADLT (ELECTROSURGICAL) ×2
ELECTRODE REM PT RTRN 9FT ADLT (ELECTROSURGICAL) ×1 IMPLANT
GAUZE SPONGE 4X4 12PLY STRL (GAUZE/BANDAGES/DRESSINGS) ×2 IMPLANT
GLOVE BIO SURGEON STRL SZ 6.5 (GLOVE) ×2 IMPLANT
GLOVE BIO SURGEON STRL SZ8 (GLOVE) ×2 IMPLANT
GLOVE BIOGEL PI IND STRL 8 (GLOVE) ×2 IMPLANT
GLOVE BIOGEL PI INDICATOR 8 (GLOVE) ×2
GLOVE ECLIPSE 8.0 STRL XLNG CF (GLOVE) ×2 IMPLANT
GLOVE SURG SS PI 6.5 STRL IVOR (GLOVE) ×2 IMPLANT
GOWN STRL REUS W/ TWL LRG LVL3 (GOWN DISPOSABLE) ×1 IMPLANT
GOWN STRL REUS W/ TWL XL LVL3 (GOWN DISPOSABLE) ×1 IMPLANT
GOWN STRL REUS W/TWL LRG LVL3 (GOWN DISPOSABLE) ×2
GOWN STRL REUS W/TWL XL LVL3 (GOWN DISPOSABLE) ×2
NDL SAFETY ECLIPSE 18X1.5 (NEEDLE) IMPLANT
NEEDLE HYPO 18GX1.5 SHARP (NEEDLE)
NEEDLE HYPO 25X1 1.5 SAFETY (NEEDLE) IMPLANT
NS IRRIG 1000ML POUR BTL (IV SOLUTION) ×2 IMPLANT
PACK BASIN DAY SURGERY FS (CUSTOM PROCEDURE TRAY) ×2 IMPLANT
PAD CAST 4YDX4 CTTN HI CHSV (CAST SUPPLIES) ×2 IMPLANT
PADDING CAST COTTON 4X4 STRL (CAST SUPPLIES) ×4
PENCIL SMOKE EVACUATOR (MISCELLANEOUS) ×2 IMPLANT
SANITIZER HAND PURELL 535ML FO (MISCELLANEOUS) ×2 IMPLANT
SHEET MEDIUM DRAPE 40X70 STRL (DRAPES) ×2 IMPLANT
SLEEVE SCD COMPRESS KNEE MED (MISCELLANEOUS) IMPLANT
SPONGE LAP 18X18 RF (DISPOSABLE) ×2 IMPLANT
STOCKINETTE 6  STRL (DRAPES) ×4
STOCKINETTE 6 STRL (DRAPES) ×2 IMPLANT
SUCTION FRAZIER HANDLE 10FR (MISCELLANEOUS)
SUCTION TUBE FRAZIER 10FR DISP (MISCELLANEOUS) IMPLANT
SUT ETHILON 2 0 FS 18 (SUTURE) ×4 IMPLANT
SUT ETHILON 2 0 FSLX (SUTURE) IMPLANT
SUT ETHILON 3 0 PS 1 (SUTURE) IMPLANT
SUT MNCRL AB 3-0 PS2 18 (SUTURE) IMPLANT
SWAB COLLECTION DEVICE MRSA (MISCELLANEOUS) IMPLANT
SWAB CULTURE ESWAB REG 1ML (MISCELLANEOUS) IMPLANT
SYR BULB EAR ULCER 3OZ GRN STR (SYRINGE) ×2 IMPLANT
SYR CONTROL 10ML LL (SYRINGE) ×2 IMPLANT
TOWEL GREEN STERILE FF (TOWEL DISPOSABLE) ×2 IMPLANT
TUBE CONNECTING 20X1/4 (TUBING) IMPLANT
UNDERPAD 30X36 HEAVY ABSORB (UNDERPADS AND DIAPERS) ×2 IMPLANT

## 2020-08-12 NOTE — Op Note (Signed)
08/12/2020  1:20 PM  PATIENT:  [name]  54 y.o. female  PRE-OPERATIVE DIAGNOSIS:  1.  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  Right third toe diabetic ulcer and osteomyelitis  POST-OPERATIVE DIAGNOSIS: Same  Procedure(s): 1.  Left first ray amputation 2.  Left third toe amputation through the MTP joint 3.  Right third toe amputation through the MTP joint  SURGEON:  Toni Arthurs, MD  ASSISTANT: None  ANESTHESIA:   General  EBL:  minimal   TOURNIQUET: Left: Less than 30 minutes with an ankle Esmarch; right: Less than 20 minutes with an ankle Esmarch  COMPLICATIONS:  None apparent  DISPOSITION:  Extubated, awake and stable to recovery.  INDICATION FOR PROCEDURE: The patient is a 54 year old female with a past medical history significant for Wegener's granulomatosis.  She is diabetic due to the use of high-dose steroids.  She has developed ulcers of her left hallux and third toe and her right third toe which have progressed to osteomyelitis.  She has failed nonoperative treatment to date and presents for amputation of the left hallux and third toe as well as the right third toe.  The risks and benefits of the alternative treatment options have been discussed in detail.  The patient wishes to proceed with surgery and specifically understands risks of bleeding, infection, nerve damage, blood clots, need for additional surgery, revision amputation and death.  PROCEDURE IN DETAIL: After preoperative consent was obtained the correct operative sites were identified, the patient was brought to the operating room and placed upon the operating table.  Preoperative antibiotics were administered.  General anesthesia was administered.  Surgical timeout was taken.  Both lower extremities were prepped and draped in standard sterile fashion.  The left foot was exsanguinated and a 4 inch Esmarch tourniquet wrapped around the ankle.  A racquet style incision was marked on  the skin around the third toe proximal from the area of necrosis.  The incision was made dissection was carried down through the subcutaneous tissues to the proximal phalanx.  Subperiosteal dissection was then carried to the MTP joint where the toe was disarticulated.  It was passed off the field.  There was no purulence or necrotic tissue evident.  The wound was irrigated copiously.  Neurovascular bundles were cauterized.  Vancomycin powder was sprinkled in the wound.  The wound was closed with nylon.  Attention was turned to the left hallux.  A racquet style incision was made around the base of the toe and dissection carried down to the proximal phalanx.  Subperiosteal dissection was then carried to the MTP joint where the toe was disarticulated.  The skin closure was too tight with the metatarsal head in place.  The head was then resected with a bone cutter.  The remaining soft tissues appeared healthy.  Neurovascular bundles were cauterized.  The wound was irrigated copiously and sprinkled with vancomycin powder.  The skin incision was closed with horizontal mattress sutures of 2-0 nylon.  Sterile dressings were applied followed by a compression wrap.  The tourniquet was released after application of the dressings.  Attention was turned to the right foot.  The foot was exsanguinated and a 4 inch Esmarch tourniquet wrapped around the ankle.  An incision was then made at the third toe proximal to the area of necrosis.  Subperiosteal dissection was then carried along the proximal phalanx and the toe was disarticulated at the MTP joint.  The remaining soft tissues  appeared healthy.  Neurovascular bundles were cauterized.  The wound was irrigated copiously and sprinkled with vancomycin powder.  The incision was closed with 2-0 nylon horizontal mattress sutures.  Sterile dressings were applied followed by a compression wrap.  The tourniquet was released after application of the dressings.  The patient was awakened  from anesthesia and transported to the recovery room in stable condition.   FOLLOW UP PLAN: Weightbearing as tolerated in flat postop shoes.  Follow-up in the office in 2 weeks for suture removal.  No indication for DVT prophylaxis in this ambulatory patient.

## 2020-08-12 NOTE — Transfer of Care (Signed)
Immediate Anesthesia Transfer of Care Note  Patient: [name]  Procedure(s) Performed: Right 3rd toe amputation; left hallux and 3rd toe amputation (Bilateral Toe)  Patient Location: PACU  Anesthesia Type:General  Level of Consciousness: awake, alert  and oriented  Airway & Oxygen Therapy: Patient Spontanous Breathing and Patient connected to face mask oxygen  Post-op Assessment: Report given to RN and Post -op Vital signs reviewed and stable  Post vital signs: Reviewed and stable  Last Vitals:  Vitals Value Taken Time  BP    Temp    Pulse 68 08/12/20 1318  Resp    SpO2 95 % 08/12/20 1318  Vitals shown include unvalidated device data.  Last Pain:  Vitals:   08/12/20 1016  TempSrc: Oral  PainSc: 7       Patients Stated Pain Goal: 3 (08/12/20 1016)  Complications: No complications documented.

## 2020-08-12 NOTE — Anesthesia Procedure Notes (Signed)
Procedure Name: LMA Insertion Performed by: Zarin Hagmann M, CRNA Pre-anesthesia Checklist: Patient identified, Emergency Drugs available, Suction available and Patient being monitored Patient Re-evaluated:Patient Re-evaluated prior to induction Oxygen Delivery Method: Circle system utilized Preoxygenation: Pre-oxygenation with 100% oxygen Induction Type: IV induction Ventilation: Mask ventilation without difficulty LMA: LMA inserted LMA Size: 4.0 Number of attempts: 1 Placement Confirmation: positive ETCO2 and CO2 detector Tube secured with: Tape Dental Injury: Teeth and Oropharynx as per pre-operative assessment        

## 2020-08-12 NOTE — H&P (Signed)
[name] is an 54 y.o. female.   Chief Complaint: Bilateral forefoot ulcers HPI: The patient is a 54 year old female with a past medical history significant for Wegener's granulomatosis.  She has developed ulcers of both feet in recent weeks.  They have progressed to the point of exposed bone.  Radiographs reveal osteomyelitis of the third toes bilaterally and the left hallux.  She has an ulcer at the right hallux but no exposed bone or evident osteomyelitis.  She presents now for right foot third toe amputation and left hallux and third toe amputation.  Past Medical History:  Diagnosis Date  . Chronic cough   . Chronic pain   . Diabetes mellitus without complication (HCC)   . Dyspnea   . GERD (gastroesophageal reflux disease)   . Hypokalemia   . Hypothyroidism   . Neurocardiogenic syncope   . OSA (obstructive sleep apnea)    does not use CPAP  . Osteomyelitis (HCC)    bilateral feet  . Peripheral neuropathy   . Presence of intrathecal pump    recieves Prialt/bupivicaine  . Wegener's granulomatosis 2009  . Wegner's disease (congenital syphilitic osteochondritis)     Past Surgical History:  Procedure Laterality Date  . APPENDECTOMY    . BACK SURGERY    . CHOLECYSTECTOMY    . COLONOSCOPY N/A 06/02/2013   Procedure: COLONOSCOPY;  Surgeon: Graylin Shiver, MD;  Location: [facility] ENDOSCOPY;  Service: Endoscopy;  Laterality: N/A;  . HERNIA REPAIR  2000  . INCISION AND DRAINAGE ABSCESS Left 07/07/2016   Procedure: DEBRIDMENT LEFT THIGH ABSCESS, EXISION ACUTE SKIN RASH LEFT THIGH(1CM LESION);  Surgeon: Claud Kelp, MD;  Location: [facility] ORS;  Service: General;  Laterality: Left;  . SEPTOPLASTY  02/2013  . spleen anuyism    . splenic aneurysm    . VAGINAL HYSTERECTOMY      Family History  Problem Relation Age of Onset  . Diabetes Mother   . Heart disease Mother   . Diabetes Father    Social History:  reports that she has never smoked. She has never used smokeless tobacco. She reports  current alcohol use. She reports that she does not use drugs.  Allergies:  Allergies  Allergen Reactions  . Ibuprofen Other (See Comments)    Pt is unable to take this due to kidney problems.   Other reaction(s): Other Not to take with other medications   . Penicillins Rash and Other (See Comments)    Has patient had a PCN reaction causing immediate rash, facial/tongue/throat swelling, SOB or lightheadedness with hypotension: No Has patient had a PCN reaction causing severe rash involving mucus membranes or skin necrosis: No Has patient had a PCN reaction that required hospitalization No Has patient had a PCN reaction occurring within the last 10 years: No If all of the above answers are "NO", then may proceed with Cephalosporin use.    Medications Prior to Admission  Medication Sig Dispense Refill  . acetaminophen (TYLENOL) 325 MG tablet Take 2 tablets (650 mg total) by mouth every 6 (six) hours as needed for mild pain (or Fever >/= 101).    Marland Kitchen b complex vitamins tablet Take 1 tablet by mouth daily.    . Calcium Carb-Cholecalciferol (CALCIUM CARBONATE-VITAMIN D3 PO) Take 1 tablet by mouth daily.    Marland Kitchen dextromethorphan-guaiFENesin (MUCINEX DM) 30-600 MG 12hr tablet Take 1 tablet by mouth 2 (two) times daily as needed for cough.    . DULoxetine (CYMBALTA) 60 MG capsule Take 60 mg by mouth 2 (  two) times daily.    . ferrous sulfate 325 (65 FE) MG tablet Take 325 mg by mouth daily with breakfast.    . fluticasone (FLONASE) 50 MCG/ACT nasal spray Place 2 sprays into both nostrils daily as needed for rhinitis.    . folic acid (FOLVITE) 1 MG tablet Take 1 mg by mouth daily.    Marland Kitchen gabapentin (NEURONTIN) 300 MG capsule Take 3 capsules (900 mg total) by mouth in the morning, 2 capsules (600 mg total) in the early afternoon, and 4 capsules (1200 mg total) in the evening.    . lidocaine (LIDODERM) 5 % Place 1 patch onto the skin daily as needed (for pain). Remove & Discard patch within 12 hours or as  directed by MD    . magnesium oxide (MAG-OX) 400 MG tablet Take 400 mg by mouth 2 (two) times daily.    . metFORMIN (GLUCOPHAGE) 500 MG tablet Take 500 mg by mouth 2 (two) times daily with a meal.    . methotrexate 50 MG/2ML injection Inject 1 mL into the skin once a week.     . montelukast (SINGULAIR) 10 MG tablet Take 10 mg by mouth daily.  3  . Potassium Chloride Crys ER (K-DUR PO) Take 40 mEq by mouth 3 (three) times daily.    . predniSONE (DELTASONE) 5 MG tablet Take 5 mg by mouth daily with breakfast.    . PRESCRIPTION MEDICATION by Intrathecal route as directed. ziconotide (PF) 25 mcg/mL, BUPivacaine (PF) 18 mg/mL 20 mL intrathecal pump refill    . tapentadol (NUCYNTA) 50 MG tablet Take 50 mg by mouth 2 (two) times daily.    Marland Kitchen topiramate (TOPAMAX) 100 MG tablet Take 100 mg by mouth daily.    . traZODone (DESYREL) 100 MG tablet Take 1 tablet (100 mg total) by mouth at bedtime.    . vitamin B-12 (CYANOCOBALAMIN) 1000 MCG tablet Take 1,000 mcg by mouth daily.    . Vitamin D, Ergocalciferol, (DRISDOL) 50000 units CAPS capsule Take 50,000 Units by mouth every 7 (seven) days. Pt takes on Friday.    Marland Kitchen albuterol (PROVENTIL HFA;VENTOLIN HFA) 108 (90 Base) MCG/ACT inhaler Inhale 2 puffs into the lungs every 6 (six) hours as needed for wheezing or shortness of breath.    . famotidine (PEPCID) 20 MG tablet Take 20 mg daily    . fludrocortisone (FLORINEF) 0.1 MG tablet TAKE 2 TABLETS BY MOUTH EVERY DAY. 180 tablet 0  . furosemide (LASIX) 40 MG tablet Take 40 mg by mouth as directed.     . sulfamethoxazole-trimethoprim (BACTRIM,SEPTRA) 400-80 MG tablet TAKE 1 TABLET 3 TIMES A WEEK  3  . tizanidine (ZANAFLEX) 2 MG capsule TAKE ONE CAPSULE BY MOUTH 3 TIMES A DAY AS NEEDED FOR MUSCLE SPASMS    . ZICONOTIDE ACETATE IT by Intrathecal route every 3 (three) months.      Results for orders placed or performed during the hospital encounter of 08/12/20 (from the past 48 hour(s))  Basic metabolic panel per  protocol     Status: Abnormal   Collection Time: 08/10/20 12:00 PM  Result Value Ref Range   Sodium 144 135 - 145 mmol/L   Potassium 3.6 3.5 - 5.1 mmol/L   Chloride 107 98 - 111 mmol/L   CO2 26 22 - 32 mmol/L   Glucose, Bld 112 (H) 70 - 99 mg/dL    Comment: Glucose reference range applies only to samples taken after fasting for at least 8 hours.   BUN 22 (H) 6 -  20 mg/dL   Creatinine, Ser 2.84 0.44 - 1.00 mg/dL   Calcium 8.7 (L) 8.9 - 10.3 mg/dL   GFR, Estimated >13 >24 mL/min   Anion gap 11 5 - 15    Comment: Performed at [facility], [address], [place], [place] [address]  Glucose, capillary     Status: Abnormal   Collection Time: 08-17-20 10:52 AM  Result Value Ref Range   Glucose-Capillary 107 (H) 70 - 99 mg/dL    Comment: Glucose reference range applies only to samples taken after fasting for at least 8 hours.   No results found.  Review of Systems no recent fever, chills, nausea, vomiting or changes in her appetite  Blood pressure 115/74, pulse 74, temperature 98.2 F (36.8 C), temperature source Oral, resp. rate 16, height 5\' 6"  (1.676 m), weight 91.3 kg, SpO2 100 %. Physical Exam  Well-nourished well-developed woman in no apparent distress.  Alert and oriented.  Normal mood and affect.  Gait is flatfoot flatfoot and postop shoes.  The right hallux has a dorsal ulcer but no exposed bone.  The right third toe is mummified distally with exposed flexor tendon and bone plantarly.  The adjacent toes have brisk capillary refill and intact sensibility to light touch.  The left hallux and third toe have exposed bone with mummification distally and plantarly.  Adjacent toes have brisk capillary refill and intact sensibility to light touch.  5 out of 5 strength in plantarflexion and dorsiflexion of her ankles and toes bilaterally.   Assessment/Plan Right third toe ulcer and osteomyelitis, left hallux and third toe ulcers and osteomyelitis -to the operating room today for right  third toe and left hallux and third toe amputations.  She understands the risks and benefits of the alternative treatment options and elect surgical treatment.  She specifically understands risks of bleeding, infection, nerve damage, need for revision amputation, involvement of adjacent digits and death.  , MD 2020-08-17, 11:59 AM

## 2020-08-12 NOTE — Discharge Instructions (Addendum)
Call your surgeon if you experience:   1.  Fever over 101.0. 2.  Inability to urinate. 3.  Nausea and/or vomiting. 4.  Extreme swelling or bruising at the surgical site. 5.  Continued bleeding from the incision. 6.  Increased pain, redness or drainage from the incision. 7.  Problems related to your pain medication. 8.  Any problems and/or Bennie Dallas, MD [facility]  Please read the following information regarding your care after surgery.  Medications  You only need a prescription for the narcotic pain medicine (ex. oxycodone, Percocet, Norco).  All of the other medicines listed below are available over the counter. X Aleve 2 pills twice a day for the first 3 days after surgery. X acetominophen (Tylenol) 650 mg every 4-6 hours as you need for minor to moderate pain X Nucynta as prescribed for severe pain  Narcotic pain medicine (ex. oxycodone, Percocet, Vicodin) will cause constipation.  To prevent this problem, take the following medicines while you are taking any pain medicine. ? docusate sodium (Colace) 100 mg twice a day ? senna (Senokot) 2 tablets twice a day  ? To help prevent blood clots, take a baby aspirin (81 mg) twice a day for two weeks after surgery.  You should also get up every hour while you are awake to move around.    Weight Bearing ? Bear weight when you are able on your operated leg or foot. X Bear weight only on your operated foot in the post-op shoe. ? Do not bear any weight on the operated leg or foot.  Cast / Splint / Dressing X Keep your splint, cast or dressing clean and dry.  Don't put anything (coat hanger, pencil, etc) down inside of it.  If it gets damp, use a hair dryer on the cool setting to dry it.  If it gets soaked, call the office to schedule an appointment for a cast change. ? Remove your dressing 3 days after surgery and cover the incisions with dry dressings.    After your dressing, cast or splint is removed; you may shower, but do not  soak or scrub the wound.  Allow the water to run over it, and then gently pat it dry.  Swelling It is normal for you to have swelling where you had surgery.  To reduce swelling and pain, keep your toes above your nose for at least 3 days after surgery.  It may be necessary to keep your foot or leg elevated for several weeks.  If it hurts, it should be elevated.  Follow Up Call my office at [phone] when you are discharged from the hospital or surgery center to schedule an appointment to be seen two weeks after surgery.  Call my office at [phone] if you develop a fever >101.5 F, nausea, vomiting, bleeding from the surgical site or severe pain.     Post Anesthesia Home Care Instructions  Activity: Get plenty of rest for the remainder of the day. A responsible individual must stay with you for 24 hours following the procedure.  For the next 24 hours, DO NOT: -Drive a car -Advertising copywriter -Drink alcoholic beverages -Take any medication unless instructed by your physician -Make any legal decisions or sign important papers.  Meals: Start with liquid foods such as gelatin or soup. Progress to regular foods as tolerated. Avoid greasy, spicy, heavy foods. If nausea and/or vomiting occur, drink only clear liquids until the nausea and/or vomiting subsides. Call your physician if vomiting continues.  Special Instructions/Symptoms: Your  throat may feel dry or sore from the anesthesia or the breathing tube placed in your throat during surgery. If this causes discomfort, gargle with warm salt water. The discomfort should disappear within 24 hours.  If you had a scopolamine patch placed behind your ear for the management of post- operative nausea and/or vomiting:  1. The medication in the patch is effective for 72 hours, after which it should be removed.  Wrap patch in a tissue and discard in the trash. Wash hands thoroughly with soap and water. 2. You may remove the patch earlier than 72  hours if you experience unpleasant side effects which may include dry mouth, dizziness or visual disturbances. 3. Avoid touching the patch. Wash your hands with soap and water after contact with the patch.

## 2020-08-12 NOTE — Anesthesia Preprocedure Evaluation (Signed)
Anesthesia Evaluation  Patient identified by MRN, date of birth, ID band Patient awake    Reviewed: Allergy & Precautions, NPO status , Patient's Chart, lab work & pertinent test results  History of Anesthesia Complications Negative for: history of anesthetic complications  Airway Mallampati: II  TM Distance: >3 FB Neck ROM: Full    Dental no notable dental hx. (+) Dental Advisory Given   Pulmonary shortness of breath, sleep apnea ,  weagners   Pulmonary exam normal breath sounds clear to auscultation       Cardiovascular hypertension, Pt. on medications negative cardio ROS Normal cardiovascular exam Rhythm:Regular Rate:Normal     Neuro/Psych  Headaches,  Neuromuscular disease (peripheral neuropathy) negative psych ROS   GI/Hepatic Neg liver ROS, GERD  ,  Endo/Other  diabetes, Type 2Hypothyroidism obesity  Renal/GU Renal disease  negative genitourinary   Musculoskeletal  (+) Arthritis , Osteoarthritis,    Abdominal (+) + obese,   Peds negative pediatric ROS (+)  Hematology negative hematology ROS (+)   Anesthesia Other Findings   Reproductive/Obstetrics negative OB ROS                             Anesthesia Physical  Anesthesia Plan  ASA: III  Anesthesia Plan: General   Post-op Pain Management:    Induction: Intravenous  PONV Risk Score and Plan: 3 and Ondansetron, Dexamethasone, Midazolam and Treatment may vary due to age or medical condition  Airway Management Planned: LMA  Additional Equipment:   Intra-op Plan:   Post-operative Plan: Extubation in OR  Informed Consent: I have reviewed the patients History and Physical, chart, labs and discussed the procedure including the risks, benefits and alternatives for the proposed anesthesia with the patient or authorized representative who has indicated his/her understanding and acceptance.     Dental advisory given  Plan  Discussed with: CRNA  Anesthesia Plan Comments:         Anesthesia Quick Evaluation

## 2020-08-13 ENCOUNTER — Encounter (HOSPITAL_BASED_OUTPATIENT_CLINIC_OR_DEPARTMENT_OTHER): Payer: Self-pay | Admitting: Orthopedic Surgery

## 2020-08-13 NOTE — Anesthesia Postprocedure Evaluation (Signed)
Anesthesia Post Note  Patient: [name]  Procedure(s) Performed: Right 3rd toe amputation; left hallux and 3rd toe amputation (Bilateral Toe)     Patient location during evaluation: PACU Anesthesia Type: General Level of consciousness: awake and alert Pain management: pain level controlled Vital Signs Assessment: post-procedure vital signs reviewed and stable Respiratory status: spontaneous breathing, nonlabored ventilation and respiratory function stable Cardiovascular status: blood pressure returned to baseline and stable Postop Assessment: no apparent nausea or vomiting Anesthetic complications: no   No complications documented.  Last Vitals:  Vitals:   08/12/20 1415 08/12/20 1530  BP: 101/73 110/65  Pulse: 65 70  Resp: 12 14  Temp:  36.7 C  SpO2: 95% 98%    Last Pain:  Vitals:   08/12/20 1530  TempSrc:   PainSc: 4                  Lowella Curb

## 2020-09-04 NOTE — Progress Notes (Signed)
Cardiology Office Note   Date:  09/06/2020   ID:  [name], DOB 1966-08-28, MRN [id number]  PCP:  Elizabeth Palau, [name]  Cardiologist:   Benton Tooker [place], MD   Chief Complaint  Patient presents with  . Loss of Consciousness      History of Present Illness: [name] is a 54 y.o. female who presents for follow up POTs. She has a history of autonomic dysfunction/POTS/neurocardiogenic syncope, diabetes mellitus, obstructive sleep apnea unable to tolerate CPAPor BiPAPmask due to her granulomatosis, Wegener's granulomatosis, neuropathy with chronic pain, and CKD stage III.  Patient has history of hypotension with many years with symptoms of dizziness, fatigue, lethargy, and inability to focus. She has had multiple falls with injuries.Prior cardiac work-up includes a normal Echo at [facility] in 2012. She also had a Tilt Table Test in 07/2013 and has been treated for neurocardiogenic syncope with chronic Florinef. In 2015, she had a normal stress Echo but with poor functional status at [facility]. Symptoms persisted despite Florinef; therefore, patient was also placed on Midodrine. She was seen by Dr. Ladona Ridgel in 01/2018 and it was felt like she had classic neurocardiogenic syncope symptoms with autonomic dysfunction. Continued medical and lifestyle modification was recommended.Symptoms persisted on both Florinef and Midodrine so she was seen by our Pharm D in 03/2018 and started on Northera. This was briefly held after patient developed severe leg fatigue and weakness. It was later felt that this was due to a flair of her Wegener's disease and Northera was able to be resumed.  Symptoms did improve significantly after starting Northera.   She was admitted in 12/2018 with respiratory failure which was felt to be due to granulomatosis/polyangitis. She was treated with steroids. Also started on antibiotics due to fever and extensive skin lesions. Echo during this admission showed LVEF of 65-70%  with normal wall motion and diastolic function.  Patient was seen in 03/2019 at which time patient reported she was not able to take Northera due to cost. It was going to cost her $1600/month. Sometime prior to this visit it looks like her Midodrine was stopped.  She was admitted from 09/10/2019 to 09/15/2019 for bilateral lower toe infection and significant hypokalemia. Following discharge, patient was seen by Dr. 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 He was concerned that Florinef continues to put her at risk for hypokalemia even after lowering the dose. Therefore, Florinef was stopped.  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   She does have recurrent severe hypokalemia.   She was admitted in October for  toe ulcers and osteomyelitis. Had multiple toe amputations. That is doing better but still has one toe that is tenuous.   Patient hasknownsleep apnea but has not been able to tolerate CPAP or BiPAP masks.  On follow up today she is doing well from a cardiac standpoint. She is on no therapy for orthostasis/POTS. Denies any significant dizziness or syncope.    Past Medical History:  Diagnosis Date  . Chronic cough   . Chronic pain   . Diabetes mellitus without complication (HCC)   . Dyspnea   . GERD (gastroesophageal reflux disease)   . Hypokalemia   . Hypothyroidism   . Neurocardiogenic syncope   . OSA (obstructive sleep apnea)    does not use CPAP  . Osteomyelitis (HCC)    bilateral feet  . Peripheral neuropathy   . Presence  of intrathecal pump    recieves Prialt/bupivicaine  . Wegener's granulomatosis 2009  . Wegner's disease (congenital syphilitic osteochondritis)     Past Surgical History:  Procedure Laterality Date  . AMPUTATION TOE Bilateral 08/12/2020   Procedure: Right 3rd toe  amputation; left hallux and 3rd toe amputation;  Surgeon: Toni ArthursHewitt, John, MD;  Location: [facility];  Service: Orthopedics;  Laterality: Bilateral;  45min  . APPENDECTOMY    . BACK SURGERY    . CHOLECYSTECTOMY    . COLONOSCOPY N/A 06/02/2013   Procedure: COLONOSCOPY;  Surgeon: Graylin ShiverSalem F Ganem, MD;  Location[facility];  Service: Endoscopy;  Laterality: N/A;  . HERNIA REPAIR  2000  . INCISION AND DRAINAGE ABSCESS Left 07/07/2016   Procedure: DEBRIDMENT LEFT THIGH ABSCESS, EXISION ACUTE SKIN RASH LEFT THIGH(1CM LESION);  Surgeon: Claud KelpHaywood Ingram, MD;  Location: [facility];  Service: General;  Laterality: Left;  . SEPTOPLASTY  02/2013  . spleen anuyism    . splenic aneurysm    . VAGINAL HYSTERECTOMY       Current Outpatient Medications  Medication Sig Dispense Refill  . acetaminophen (TYLENOL) 325 MG tablet Take 2 tablets (650 mg total) by mouth every 6 (six) hours as needed for mild pain (or Fever >/= 101).    Marland Kitchen. albuterol (PROVENTIL HFA;VENTOLIN HFA) 108 (90 Base) MCG/ACT inhaler Inhale 2 puffs into the lungs every 6 (six) hours as needed for wheezing or shortness of breath.    Marland Kitchen. b complex vitamins tablet Take 1 tablet by mouth daily.    . Calcium Carb-Cholecalciferol (CALCIUM CARBONATE-VITAMIN D3 PO) Take 1 tablet by mouth daily.    Marland Kitchen. dextromethorphan-guaiFENesin (MUCINEX DM) 30-600 MG 12hr tablet Take 1 tablet by mouth 2 (two) times daily as needed for cough.    . DULoxetine (CYMBALTA) 60 MG capsule Take 60 mg by mouth 2 (two) times daily.    . famotidine (PEPCID) 20 MG tablet Take 20 mg daily    . ferrous sulfate 325 (65 FE) MG tablet Take 325 mg by mouth daily with breakfast.    . fluticasone (FLONASE) 50 MCG/ACT nasal spray Place 2 sprays into both nostrils daily as needed for rhinitis.    . folic acid (FOLVITE) 1 MG tablet Take 1 mg by mouth daily.    . furosemide (LASIX) 40 MG tablet Take 40 mg by mouth as directed.     . gabapentin (NEURONTIN) 300 MG capsule Take 3 capsules  (900 mg total) by mouth in the morning, 2 capsules (600 mg total) in the early afternoon, and 4 capsules (1200 mg total) in the evening.    . lidocaine (LIDODERM) 5 % Place 1 patch onto the skin daily as needed (for pain). Remove & Discard patch within 12 hours or as directed by MD    . magnesium oxide (MAG-OX) 400 MG tablet Take 400 mg by mouth 2 (two) times daily.    . metFORMIN (GLUCOPHAGE) 500 MG tablet Take 500 mg by mouth 2 (two) times daily with a meal.    . methotrexate 50 MG/2ML injection Inject 1 mL into the skin once a week.     . montelukast (SINGULAIR) 10 MG tablet Take 10 mg by mouth daily.  3  . Potassium Chloride Crys ER (K-DUR PO) Take 40 mEq by mouth 3 (three) times daily.    . predniSONE (DELTASONE) 5 MG tablet Take 20 mg by mouth daily with breakfast.     . PRESCRIPTION MEDICATION by Intrathecal route as directed. ziconotide (PF) 25  mcg/mL, BUPivacaine (PF) 18 mg/mL 20 mL intrathecal pump refill    . sulfamethoxazole-trimethoprim (BACTRIM,SEPTRA) 400-80 MG tablet TAKE 1 TABLET 3 TIMES A WEEK  3  . tapentadol (NUCYNTA) 50 MG tablet Take 50 mg by mouth 2 (two) times daily.    . tizanidine (ZANAFLEX) 2 MG capsule TAKE ONE CAPSULE BY MOUTH 3 TIMES A DAY AS NEEDED FOR MUSCLE SPASMS    . topiramate (TOPAMAX) 100 MG tablet Take 100 mg by mouth daily.    . traZODone (DESYREL) 100 MG tablet Take 1 tablet (100 mg total) by mouth at bedtime.    . vitamin B-12 (CYANOCOBALAMIN) 1000 MCG tablet Take 1,000 mcg by mouth daily.    . Vitamin D, Ergocalciferol, (DRISDOL) 50000 units CAPS capsule Take 50,000 Units by mouth every 7 (seven) days. Pt takes on Friday.    Marland Kitchen ZICONOTIDE ACETATE IT by Intrathecal route every 3 (three) months.     No current facility-administered medications for this visit.    Allergies:   Ibuprofen and Penicillins    Social History:  The patient  reports that she has never smoked. She has never used smokeless tobacco. She reports current alcohol use. She reports that  she does not use drugs.   Family History:  The patient's family history includes Diabetes in her father and mother; Heart disease in her mother.    ROS:  Please see the history of present illness.   Otherwise, review of systems are positive for pain in great toe. Some intermittent leg swelling.   All other systems are reviewed and negative.    PHYSICAL EXAM: VS:  BP (!) 122/91   Pulse 76   Temp 97.9 F (36.6 C)   Ht 5\' 6"  (1.676 m)   Wt 204 lb 3.2 oz (92.6 kg)   SpO2 98%   BMI 32.96 kg/m  , BMI Body mass index is 32.96 kg/m. GEN: Well nourished, well developed, in no acute distress  HEENT: nasal deformation Neck: no JVD, carotid bruits, or masses Cardiac: RRR; soft 1/6 systolic murmur at the apex, rubs, or gallops,no edema  Respiratory:  clear to auscultation bilaterally, normal work of breathing GI: soft, nontender, nondistended, + BS MS: no deformity or atrophy  Skin: warm and dry, no rash Neuro:  Strength and sensation are intact Psych: euthymic mood, full affect   EKG:  EKG is ordered today. The ekg ordered today demonstrates NSR with normal Ecg. I have personally reviewed and interpreted this study.    Recent Labs: 08/10/2020: BUN 22; Creatinine, Ser 0.98; Potassium 3.6; Sodium 144    Lipid Panel No results found for: CHOL, TRIG, HDL, CHOLHDL, VLDL, LDLCALC, LDLDIRECT    Wt Readings from Last 3 Encounters:  09/06/20 204 lb 3.2 oz (92.6 kg)  08/12/20 201 lb 4.5 oz (91.3 kg)  10/14/19 218 lb 6.4 oz (99.1 kg)      Other studies Reviewed: Additional studies/ records that were reviewed today include: none   ASSESSMENT AND PLAN:  1.  Orthostatic hypotension/POTS. Clinically doing very well. Off Florinef. Northera worked well but she couldn't afford. Fortunately she is doing well.  2. Wegener's granulomatosis on chronic steroids and MTX  3. OSA intolerant to CPAP mask.  4. Obesity. She has lost 14 lbs.   5. Osteomyelitis of toes s/p  amputation.   Current medicines are reviewed at length with the patient today.  The patient does not have concerns regarding medicines.  The following changes have been made:  no change  Labs/ tests  ordered today include:  No orders of the defined types were placed in this encounter.    Disposition:   FU with me in 1 year  Signed, Labrina Lines [place], MD  09/06/2020 2:59 PM    [facility] [address] [place], [place], [address] Phone [phone], Fax [phone]

## 2020-09-06 ENCOUNTER — Ambulatory Visit (INDEPENDENT_AMBULATORY_CARE_PROVIDER_SITE_OTHER): Payer: BC Managed Care – PPO | Admitting: Cardiology

## 2020-09-06 ENCOUNTER — Other Ambulatory Visit: Payer: Self-pay

## 2020-09-06 ENCOUNTER — Encounter: Payer: Self-pay | Admitting: Cardiology

## 2020-09-06 VITALS — BP 122/91 | HR 76 | Temp 97.9°F | Ht 66.0 in | Wt 204.2 lb

## 2020-09-06 DIAGNOSIS — R55 Syncope and collapse: Secondary | ICD-10-CM | POA: Diagnosis not present

## 2020-09-06 DIAGNOSIS — G90A Postural orthostatic tachycardia syndrome (POTS): Secondary | ICD-10-CM

## 2020-09-06 DIAGNOSIS — G4733 Obstructive sleep apnea (adult) (pediatric): Secondary | ICD-10-CM | POA: Diagnosis not present

## 2020-09-06 DIAGNOSIS — I498 Other specified cardiac arrhythmias: Secondary | ICD-10-CM | POA: Diagnosis not present

## 2020-09-06 MED ORDER — FUROSEMIDE 40 MG PO TABS
40.0000 mg | ORAL_TABLET | ORAL | 3 refills | Status: DC
Start: 2020-09-06 — End: 2022-01-08

## 2020-09-15 ENCOUNTER — Other Ambulatory Visit: Payer: Self-pay

## 2020-09-15 ENCOUNTER — Encounter (HOSPITAL_BASED_OUTPATIENT_CLINIC_OR_DEPARTMENT_OTHER): Payer: BC Managed Care – PPO | Attending: Physician Assistant | Admitting: Physician Assistant

## 2020-09-15 DIAGNOSIS — L97522 Non-pressure chronic ulcer of other part of left foot with fat layer exposed: Secondary | ICD-10-CM | POA: Diagnosis present

## 2020-09-15 DIAGNOSIS — Z9221 Personal history of antineoplastic chemotherapy: Secondary | ICD-10-CM | POA: Insufficient documentation

## 2020-09-15 DIAGNOSIS — N189 Chronic kidney disease, unspecified: Secondary | ICD-10-CM | POA: Diagnosis not present

## 2020-09-15 DIAGNOSIS — L97812 Non-pressure chronic ulcer of other part of right lower leg with fat layer exposed: Secondary | ICD-10-CM | POA: Diagnosis not present

## 2020-09-15 DIAGNOSIS — L97512 Non-pressure chronic ulcer of other part of right foot with fat layer exposed: Secondary | ICD-10-CM | POA: Insufficient documentation

## 2020-09-15 DIAGNOSIS — M3131 Wegener's granulomatosis with renal involvement: Secondary | ICD-10-CM | POA: Insufficient documentation

## 2020-09-15 DIAGNOSIS — E11621 Type 2 diabetes mellitus with foot ulcer: Secondary | ICD-10-CM | POA: Diagnosis not present

## 2020-09-15 DIAGNOSIS — E1122 Type 2 diabetes mellitus with diabetic chronic kidney disease: Secondary | ICD-10-CM | POA: Insufficient documentation

## 2020-09-21 NOTE — Progress Notes (Signed)
Stacey, [name] ([id number]) Visit Report for 09/15/2020 Abuse/Suicide Risk Screen Details Patient Name: Date of Service: [name], [name] 09/15/2020 9:00 A M Medical Record Number: [id number] Patient Account Number: [id number] Date of Birth/Sex: Treating RN: 1966-06-07 (54 y.o. Female) Zandra Abts Primary Care Mikey Maffett: Elizabeth Palau Other Clinician: Referring Atiya Yera: Treating Geneve Kimpel/Extender: Duanne Limerick in Treatment: 0 Abuse/Suicide Risk Screen Items Answer ABUSE RISK SCREEN: Has anyone close to you tried to hurt or harm you recentlyo No Do you feel uncomfortable with anyone in your familyo No Has anyone forced you do things that you didnt want to doo No Electronic Signature(s) Signed: 09/21/2020 6:12:47 PM By: Zandra Abts RN, BSN Entered By: Zandra Abts on 09/15/2020 10:06:40 -------------------------------------------------------------------------------- Activities of Daily Living Details Patient Name: Date of Service: Stacey, [name] 09/15/2020 9:00 A M Medical Record Number: [id number] Patient Account Number: [id number] Date of Birth/Sex: Treating RN: 01-14-1966 (54 y.o. Female) Zandra Abts Primary Care Hellena Pridgen: Elizabeth Palau Other Clinician: Referring Revis Whalin: Treating Banjamin Stovall/Extender: Duanne Limerick in Treatment: 0 Activities of Daily Living Items Answer Activities of Daily Living (Please select one for each item) Drive Automobile Not Able T Medications ake Completely Able Use T elephone Completely Able Care for Appearance Completely Able Use T oilet Completely Able Bath / Shower Completely Able Dress Self Completely Able Feed Self Completely Able Walk Need Assistance Get In / Out Bed Completely Able Housework Need Assistance Prepare Meals Completely Able Handle Money Completely Able Shop for Self Need Assistance Electronic Signature(s) Signed: 09/21/2020 6:12:47 PM By: Zandra Abts RN,  BSN Entered By: Zandra Abts on 09/15/2020 10:07:18 -------------------------------------------------------------------------------- Education Screening Details Patient Name: Date of Service: [name], Emma-Lee 09/15/2020 9:00 A M Medical Record Number: [id number] Patient Account Number: [id number] Date of Birth/Sex: Treating RN: 1966/09/18 (54 y.o. Female) Zandra Abts Primary Care Zyshonne Malecha: Elizabeth Palau Other Clinician: Referring Verla Bryngelson: Treating Jessicaann Overbaugh/Extender: Duanne Limerick in Treatment: 0 Primary Learner Assessed: Patient Learning Preferences/Education Level/Primary Language Learning Preference: Explanation, Demonstration, Printed Material Highest Education Level: College or Above Preferred Language: English Cognitive Barrier Language Barrier: No Translator Needed: No Memory Deficit: No Emotional Barrier: No Cultural/Religious Beliefs Affecting Medical Care: No Physical Barrier Impaired Vision: No Impaired Hearing: No Decreased Hand dexterity: No Knowledge/Comprehension Knowledge Level: High Comprehension Level: High Ability to understand written instructions: High Ability to understand verbal instructions: High Motivation Anxiety Level: Calm Cooperation: Cooperative Education Importance: Acknowledges Need Interest in Health Problems: Asks Questions Perception: Coherent Willingness to Engage in Self-Management High Activities: Readiness to Engage in Self-Management High Activities: Electronic Signature(s) Signed: 09/21/2020 6:12:47 PM By: Zandra Abts RN, BSN Entered By: Zandra Abts on 09/15/2020 10:07:35 -------------------------------------------------------------------------------- Fall Risk Assessment Details Patient Name: Date of Service: [name], Stacey 09/15/2020 9:00 A M Medical Record Number: [id number] Patient Account Number: [id number] Date of Birth/Sex: Treating RN: 11/04/1965 (54 y.o. Female) Zandra Abts Primary Care Lilyrose Tanney: Elizabeth Palau Other Clinician: Referring Jervis Trapani: Treating Felipe Cabell/Extender: Duanne Limerick in Treatment: 0 Fall Risk Assessment Items Have you had 2 or more falls in the last 12 monthso 0 Yes Have you had any fall that resulted in injury in the last 12 monthso 0 No FALLS RISK SCREEN History of falling - immediate or within 3 months 0 No Secondary diagnosis (Do you have 2 or more medical diagnoseso) 15 Yes Ambulatory aid None/bed rest/wheelchair/nurse 0 Yes Crutches/cane/walker 0 No Furniture 0 No Intravenous therapy Access/Saline/Heparin Lock 0 No Gait/Transferring Normal/ bed rest/ wheelchair 0 No  Weak (short steps with or without shuffle, stooped but able to lift head while walking, may seek 10 Yes support from furniture) Impaired (short steps with shuffle, may have difficulty arising from chair, head down, impaired 0 No balance) Mental Status Oriented to own ability 0 Yes Electronic Signature(s) Signed: 09/21/2020 6:12:47 PM By: Zandra Abts RN, BSN Entered By: Zandra Abts on 09/15/2020 10:08:08 -------------------------------------------------------------------------------- Foot Assessment Details Patient Name: Date of Service: [name], [name] 09/15/2020 9:00 A M Medical Record Number: [id number] Patient Account Number: [id number] Date of Birth/Sex: Treating RN: 1966/06/06 (54 y.o. Female) Zandra Abts Primary Care Yenifer Saccente: Elizabeth Palau Other Clinician: Referring Allegra Cerniglia: Treating Kodi Guerrera/Extender: Duanne Limerick in Treatment: 0 Foot Assessment Items Site Locations + = Sensation present, - = Sensation absent, C = Callus, U = Ulcer R = Redness, W = Warmth, M = Maceration, PU = Pre-ulcerative lesion F = Fissure, S = Swelling, D = Dryness Assessment Right: Left: Other Deformity: No No Prior Foot Ulcer: No No Prior Amputation: No No Charcot Joint: No No Ambulatory  Status: Ambulatory With Help Assistance Device: Wheelchair Gait: Steady Electronic Signature(s) Signed: 09/21/2020 6:12:47 PM By: Zandra Abts RN, BSN Entered By: Zandra Abts on 09/15/2020 10:09:58 -------------------------------------------------------------------------------- Nutrition Risk Screening Details Patient Name: Date of Service: MALI, EPPARD 09/15/2020 9:00 A M Medical Record Number: [id number] Patient Account Number: [id number] Date of Birth/Sex: Treating RN: 1965-12-23 (54 y.o. Female) Zandra Abts Primary Care Haly Feher: Elizabeth Palau Other Clinician: Referring Coretha Creswell: Treating Shaylin Blatt/Extender: Duanne Limerick in Treatment: 0 Height (in): 66 Weight (lbs): 195 Body Mass Index (BMI): 31.5 Nutrition Risk Screening Items Score Screening NUTRITION RISK SCREEN: I have an illness or condition that made me change the kind and/or amount of food I eat 2 Yes I eat fewer than two meals per day 0 No I eat few fruits and vegetables, or milk products 0 No I have three or more drinks of beer, liquor or wine almost every day 0 No I have tooth or mouth problems that make it hard for me to eat 0 No I don't always have enough money to buy the food I need 0 No I eat alone most of the time 0 No I take three or more different prescribed or over-the-counter drugs a day 1 Yes Without wanting to, I have lost or gained 10 pounds in the last six months 0 No I am not always physically able to shop, cook and/or feed myself 2 Yes Nutrition Protocols Good Risk Protocol Moderate Risk Protocol 0 Provide education on nutrition High Risk Proctocol Risk Level: Moderate Risk Score: 5 Electronic Signature(s) Signed: 09/21/2020 6:12:47 PM By: Zandra Abts RN, BSN Entered By: Zandra Abts on 09/15/2020 10:08:17

## 2020-09-21 NOTE — Progress Notes (Signed)
[name], [name] ([id number]) Visit Report for 09/15/2020 Debridement Details Patient Name: Date of Service: [name], [name] 09/15/2020 9:00 A M Medical Record Number: [id number] Patient Account Number: [id number] Date of Birth/Sex: Treating RN: May 27, 1966 (54 y.o. Female) Zenaida Deed Primary Care Provider: Elizabeth Palau Other Clinician: Referring Provider: Treating Provider/Extender: Duanne Limerick in Treatment: 0 Debridement Performed for Assessment: Wound #1 Right,Lateral Foot Performed By: Little Ishikawa, RN Debridement Type: Chemical/Enzymatic/Mechanical Agent Used: Santyl Severity of Tissue Pre Debridement: Fat layer exposed Level of Consciousness (Pre-procedure): Awake and Alert Pre-procedure Verification/Time Out Yes - 10:51 Taken: Bleeding: None Response to Treatment: Procedure was tolerated well Level of Consciousness (Post- Awake and Alert procedure): Post Debridement Measurements of Total Wound Length: (cm) 2.5 Width: (cm) 2 Depth: (cm) 0.1 Volume: (cm) 0.393 Character of Wound/Ulcer Post Debridement: Requires Further Debridement Severity of Tissue Post Debridement: Fat layer exposed Post Procedure Diagnosis Same as Pre-procedure Electronic Signature(s) Signed: 09/15/2020 4:58:27 PM By: Zenaida Deed RN, BSN Signed: 09/15/2020 5:04:05 PM By: Lenda Kelp PA-C Entered By: Zenaida Deed on 09/15/2020 10:51:51 -------------------------------------------------------------------------------- Debridement Details Patient Name: Date of Service: [name], [name] 09/15/2020 9:00 A M Medical Record Number: [id number] Patient Account Number: [id number] Date of Birth/Sex: Treating RN: 02/04/1966 (54 y.o. Female) Zenaida Deed Primary Care Provider: Elizabeth Palau Other Clinician: Referring Provider: Treating Provider/Extender: Duanne Limerick in Treatment: 0 Debridement Performed for Assessment: Wound #4  Left,Lateral Foot Performed By: Little Ishikawa, RN Debridement Type: Chemical/Enzymatic/Mechanical Agent Used: Santyl Severity of Tissue Pre Debridement: Fat layer exposed Level of Consciousness (Pre-procedure): Awake and Alert Pre-procedure Verification/Time Out Yes - 10:51 Taken: Bleeding: None Response to Treatment: Procedure was tolerated well Level of Consciousness (Post- Awake and Alert Awake and Alert procedure): Post Debridement Measurements of Total Wound Length: (cm) 2.5 Width: (cm) 2.4 Depth: (cm) 0.1 Volume: (cm) 0.471 Character of Wound/Ulcer Post Debridement: Requires Further Debridement Severity of Tissue Post Debridement: Fat layer exposed Post Procedure Diagnosis Same as Pre-procedure Electronic Signature(s) Signed: 09/15/2020 4:58:27 PM By: Zenaida Deed RN, BSN Signed: 09/15/2020 5:04:05 PM By: Lenda Kelp PA-C Entered By: Zenaida Deed on 09/15/2020 10:52:30 -------------------------------------------------------------------------------- HPI Details Patient Name: Date of Service: [name], [name] 09/15/2020 9:00 A M Medical Record Number: [id number] Patient Account Number: [id number] Date of Birth/Sex: Treating RN: [birth year] (54 y.o. Female) Zenaida Deed Primary Care Provider: Elizabeth Palau Other Clinician: Referring Provider: Treating Provider/Extender: Duanne Limerick in Treatment: 0 History of Present Illness HPI Description: 09/15/2020 upon evaluation today patient appears to be doing somewhat poorly in regard to the wounds on her feet. She has a wound on the right lateral foot, right medial great toe, right dorsal great toe, left lateral foot, and right lateral lower leg. Currently based on what we are seeing the patient states that the worst issue is her right great toe. She dates that she wishes Dr. Victorino Dike had actually gone ahead and performed amputation at this site as well when he did the other toes they  have all healed well that is the right third toe, left great toe, and left third toe. All are completely healed and did excellent. Currently she has been transition into Cam boots for both feet previously she was using postop shoes but apparently this caused some rubbing on the lateral aspects of both feet and at the medial aspect of her right great toe which unfortunately has caused her a lot of discomfort and issues. She has been given mupirocin  and betamethasone for some of the small wounds that occur over her legs and arms frequently. 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 Otherwise she also does have diabetes mellitus type 2. Currently the patient is on methotrexate and also undergoes chemotherapy infusions every 6 months for the Wegener's granulomatosis currently her amputations were asked on August 12, 2020 that was with Dr. Victorino Dike. His physician assistant Jill Alexanders referred her to me today. Electronic Signature(s) Signed: 09/15/2020 4:41:45 PM By: Lenda Kelp PA-C Entered By: Lenda Kelp on 09/15/2020 16:41:44 -------------------------------------------------------------------------------- Physical Exam Details Patient Name: Date of Service: [name], [name] 09/15/2020 9:00 A M Medical Record Number: [id number] Patient Account Number: [id number] Date of Birth/Sex: Treating RN: June 04, 1966 (54 y.o. Female) Zenaida Deed Primary Care Provider: Elizabeth Palau Other Clinician: Referring Provider: Treating Provider/Extender: Duanne Limerick in Treatment: 0 Constitutional sitting or standing blood pressure is within target range for patient.. pulse regular and within target range for patient.Marland Kitchen respirations regular, non-labored and within target range for patient.Marland Kitchen temperature within target range for patient.. Well-nourished and  well-hydrated in no acute distress. Eyes conjunctiva clear no eyelid edema noted. pupils equal round and reactive to light and accommodation. Ears, Nose, Mouth, and Throat no gross abnormality of ear auricles or external auditory canals. normal hearing noted during conversation. mucus membranes moist. Respiratory normal breathing without difficulty. Cardiovascular 2+ dorsalis pedis/posterior tibialis pulses. no clubbing, cyanosis, significant edema, <3 sec cap refill. Musculoskeletal normal gait and posture. no significant deformity or arthritic changes, no loss or range of motion, no clubbing. Psychiatric this patient is able to make decisions and demonstrates good insight into disease process. Alert and Oriented x 3. pleasant and cooperative. Notes Upon inspection I did review patient's wounds currently and the lateral foot wounds bilaterally do seem to be problematic here although I do not see any signs that there is infection there is definitely signs of pressure and/or friction causing issues in this regard. I do believe Santyl would be an appropriate option for her to try to help to soften and loosen some of this up. I Minna likely send that into the pharmacy for her. With regard to blood flow she has ABIs of 1.2 bilaterally that seem to be doing excellent I see no issues in that regard. Her capillary refill also as well. Electronic Signature(s) Signed: 09/15/2020 4:42:38 PM By: Lenda Kelp PA-C Entered By: Lenda Kelp on 09/15/2020 16:42:37 -------------------------------------------------------------------------------- Physician Orders Details Patient Name: Date of Service: [name], [name] 09/15/2020 9:00 A M Medical Record Number: [id number] Patient Account Number: [id number] Date of Birth/Sex: Treating RN: 1966-09-17 (54 y.o. Female) Zenaida Deed Primary Care Provider: Elizabeth Palau Other Clinician: Referring Provider: Treating Provider/Extender: Duanne Limerick in Treatment: 0 Verbal / Phone Orders: No Diagnosis Coding ICD-10 Coding Code Description E11.621 Type 2 diabetes mellitus with foot ulcer L97.522 Non-pressure chronic ulcer of other part of left foot with fat layer exposed L97.512 Non-pressure chronic ulcer of other part of right foot with fat layer exposed L97.812 Non-pressure chronic ulcer of other part of right lower leg with fat layer exposed 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 Dressing Change Frequency Change dressing every day. - ALL wounds Wound Cleansing May shower and wash wound with soap and water. - use hibiclens soap to shower daily  for 1 week then weekly thereafter Primary Wound Dressing Wound #1 Right,Lateral Foot Santyl Ointment Wound #2 Right,Medial T Great oe Calcium Alginate with Silver Wound #3 Right T Great oe Calcium Alginate with Silver Wound #4 Left,Lateral Foot Santyl Ointment Wound #5 Right,Lateral Lower Leg Other: - mupirocin/betamethasone cream per dermatology Secondary Dressing Wound #1 Right,Lateral Foot Kerlix/Rolled Gauze Dry Gauze Wound #2 Right,Medial T Great oe Kerlix/Rolled Gauze Dry Gauze Wound #3 Right T Great oe Kerlix/Rolled Gauze Dry Gauze Wound #4 Left,Lateral Foot Kerlix/Rolled Gauze Dry Gauze Off-Loading Removable cast walker boot to: - cam boots both feet to ambulate Patient Medications llergies: ibuprofen, penicillin A Notifications Medication Indication Start End 09/15/2020 Santyl DOSE topical 250 unit/gram ointment - ointment topical Apply nickel thick daily to the wound bed and then cover with a dressing as directed in clinic x 30 days Electronic Signature(s) Signed: 09/15/2020 4:46:44 PM By: Lenda Kelp PA-C Entered By: Lenda Kelp on 09/15/2020  16:46:42 -------------------------------------------------------------------------------- Problem List Details Patient Name: Date of Service: [name], [name] 09/15/2020 9:00 A M Medical Record Number: [id number] Patient Account Number: [id number] Date of Birth/Sex: Treating RN: June 21, 1966 (54 y.o. Female) Zenaida Deed Primary Care Provider: Elizabeth Palau Other Clinician: Referring Provider: Treating Provider/Extender: Duanne Limerick in Treatment: 0 Active Problems ICD-10 Encounter Code Description Active Date MDM Diagnosis E11.621 Type 2 diabetes mellitus with foot ulcer 09/15/2020 No Yes L97.522 Non-pressure chronic ulcer of other part of left foot with fat layer exposed 09/15/2020 No Yes L97.512 Non-pressure chronic ulcer of other part of right foot with fat layer exposed 09/15/2020 No Yes L97.812 Non-pressure chronic ulcer of other part of right lower leg with fat layer 09/15/2020 No Yes exposed M31.31 Wegener's granulomatosis with renal involvement 09/15/2020 No Yes N18.9 Chronic kidney disease, unspecified 09/15/2020 No Yes Inactive Problems Resolved Problems Electronic Signature(s) Signed: 09/15/2020 10:44:01 AM By: Lenda Kelp PA-C Entered By: Lenda Kelp on 09/15/2020 10:44:00 -------------------------------------------------------------------------------- Progress Note Details Patient Name: Date of Service: [name], [name] 09/15/2020 9:00 A M Medical Record Number: [id number] Patient Account Number: [id number] Date of Birth/Sex: Treating RN: September 14, 1966 (54 y.o. Female) Zenaida Deed Primary Care Provider: Elizabeth Palau Other Clinician: Referring Provider: Treating Provider/Extender: Duanne Limerick in Treatment: 0 Subjective History of Present Illness (HPI) 09/15/2020 upon evaluation today patient appears to be doing somewhat poorly in regard to the wounds on her feet. She has a wound on the right  lateral foot, right medial great toe, right dorsal great toe, left lateral foot, and right lateral lower leg. Currently based on what we are seeing the patient states that the worst issue is her right great toe. She dates that she wishes Dr. Victorino Dike had actually gone ahead and performed amputation at this site as well when he did the other toes they have all healed well that is the right third toe, left great toe, and left third toe. All are completely healed and did excellent. Currently she has been transition into Cam boots for both feet previously she was using postop shoes but apparently this caused some rubbing on the lateral aspects of both feet and at the medial aspect of her right great toe which unfortunately has caused her a lot of discomfort and issues. She has been given mupirocin and betamethasone for some of the small wounds that occur over her legs and arms frequently. 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 This has caused some  kidney involvement apparently as well. Otherwise she also does have diabetes mellitus type 2. Currently the patient is on methotrexate and also undergoes chemotherapy infusions every 6 months for the Wegener's granulomatosis currently her amputations were asked on August 12, 2020 that was with Dr. Victorino Dike. His physician assistant Jill Alexanders referred her to me today. Patient History Information obtained from Patient. Allergies ibuprofen (Severity: Moderate), penicillin (Severity: Moderate, Reaction: rash) Family History Diabetes - Mother, Heart Disease - Mother, Kidney Disease - Father,Mother, No family history of Lung Disease, Seizures, Stroke, Thyroid Problems, Tuberculosis. Social History Never smoker, Marital Status - Married, Alcohol Use - Never, Drug Use - No History, Caffeine Use - Never. Medical History Hematologic/Lymphatic Patient has history of Anemia Respiratory Patient has history of  Sleep Apnea Cardiovascular Patient has history of Hypotension Endocrine Patient has history of Type II Diabetes - steroid induced Musculoskeletal Patient has history of Osteoarthritis Neurologic Patient has history of Neuropathy Oncologic Patient has history of Received Chemotherapy Medical A Surgical History Notes nd Cardiovascular Wegener's granulomatosis Genitourinary CKD stage 3, Adrenal Insufficiency Musculoskeletal Osteochondritis Review of Systems (ROS) Constitutional Symptoms (General Health) Denies complaints or symptoms of Fatigue, Fever, Chills, Marked Weight Change. Eyes Complains or has symptoms of Vision Changes - blurry vision. Denies complaints or symptoms of Dry Eyes, Glasses / Contacts. Ear/Nose/Mouth/Throat Denies complaints or symptoms of Chronic sinus problems or rhinitis. Gastrointestinal Denies complaints or symptoms of Frequent diarrhea, Nausea, Vomiting. Endocrine Denies complaints or symptoms of Heat/cold intolerance. Integumentary (Skin) Complains or has symptoms of Wounds - bilateral foot wounds. Objective Constitutional sitting or standing blood pressure is within target range for patient.. pulse regular and within target range for patient.Marland Kitchen respirations regular, non-labored and within target range for patient.Marland Kitchen temperature within target range for patient.. Well-nourished and well-hydrated in no acute distress. Vitals Time Taken: 9:36 AM, Height: 66 in, Source: Stated, Weight: 195 lbs, Source: Stated, BMI: 31.5, Temperature: 98.2 F, Pulse: 79 bpm, Respiratory Rate: 18 breaths/min, Blood Pressure: 100/70 mmHg. Eyes conjunctiva clear no eyelid edema noted. pupils equal round and reactive to light and accommodation. Ears, Nose, Mouth, and Throat no gross abnormality of ear auricles or external auditory canals. normal hearing noted during conversation. mucus membranes moist. Respiratory normal breathing without difficulty. Cardiovascular 2+  dorsalis pedis/posterior tibialis pulses. no clubbing, cyanosis, significant edema, Musculoskeletal normal gait and posture. no significant deformity or arthritic changes, no loss or range of motion, no clubbing. Psychiatric this patient is able to make decisions and demonstrates good insight into disease process. Alert and Oriented x 3. pleasant and cooperative. General Notes: Upon inspection I did review patient's wounds currently and the lateral foot wounds bilaterally do seem to be problematic here although I do not see any signs that there is infection there is definitely signs of pressure and/or friction causing issues in this regard. I do believe Santyl would be an appropriate option for her to try to help to soften and loosen some of this up. I Minna likely send that into the pharmacy for her. With regard to blood flow she has ABIs of 1.2 bilaterally that seem to be doing excellent I see no issues in that regard. Her capillary refill also as well. Integumentary (Hair, Skin) Wound #1 status is Open. Original cause of wound was Shear/Friction. The wound is located on the Right,Lateral Foot. The wound measures 2.5cm length x 2cm width x 0.1cm depth; 3.927cm^2 area and 0.393cm^3 volume. There is Fat Layer (Subcutaneous Tissue) exposed. There is no tunneling or undermining noted. There is  a medium amount of serosanguineous drainage noted. The wound margin is flat and intact. There is small (1-33%) red granulation within the wound bed. There is a large (67-100%) amount of necrotic tissue within the wound bed including Eschar and Adherent Slough. Wound #2 status is Open. Original cause of wound was Shear/Friction. The wound is located on the Right,Medial T Great. The wound measures 2.3cm length oe x 1.7cm width x 0.1cm depth; 3.071cm^2 area and 0.307cm^3 volume. There is Fat Layer (Subcutaneous Tissue) exposed. There is a medium amount of serosanguineous drainage noted. The wound margin is flat and  intact. There is large (67-100%) pink, pale granulation within the wound bed. There is a small (1- 33%) amount of necrotic tissue within the wound bed including Adherent Slough. Wound #3 status is Open. Original cause of wound was Trauma. The wound is located on the Right T Great. The wound measures 3.4cm length x 2.4cm width oe x 0.3cm depth; 6.409cm^2 area and 1.923cm^3 volume. There is Fat Layer (Subcutaneous Tissue) exposed. There is no tunneling or undermining noted. There is a medium amount of serosanguineous drainage noted. The wound margin is flat and intact. There is medium (34-66%) pink granulation within the wound bed. There is a medium (34-66%) amount of necrotic tissue within the wound bed including Adherent Slough. Wound #4 status is Open. Original cause of wound was Shear/Friction. The wound is located on the Left,Lateral Foot. The wound measures 2.5cm length x 2.4cm width x 0.1cm depth; 4.712cm^2 area and 0.471cm^3 volume. There is no tunneling or undermining noted. There is a small amount of serosanguineous drainage noted. The wound margin is flat and intact. There is no granulation within the wound bed. There is a large (67-100%) amount of necrotic tissue within the wound bed including Eschar. Wound #5 status is Open. Original cause of wound was Gradually Appeared. The wound is located on the Right,Lateral Lower Leg. The wound measures 0.6cm length x 0.5cm width x 0.1cm depth; 0.236cm^2 area and 0.024cm^3 volume. There is Fat Layer (Subcutaneous Tissue) exposed. There is no tunneling or undermining noted. There is a medium amount of serosanguineous drainage noted. The wound margin is flat and intact. There is medium (34-66%) red, pink granulation within the wound bed. There is a medium (34-66%) amount of necrotic tissue within the wound bed including Adherent Slough. 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 There was a Chemical/Enzymatic/Mechanical debridement performed by Yevonne Pax, RN.Marland Kitchen Agent used was [employer]. A time out was conducted at 10:51, prior to the start of the procedure. There was no bleeding. The procedure was tolerated well. Post Debridement Measurements: 2.5cm length x 2cm width x 0.1cm depth; 0.393cm^3 volume. Character of Wound/Ulcer Post Debridement requires further debridement. Severity of Tissue Post Debridement is: Fat layer exposed. Post procedure Diagnosis Wound #1: Same as Pre-Procedure Wound #4 Pre-procedure diagnosis of Wound #4 is a Diabetic Wound/Ulcer of the Lower Extremity located on the Left,Lateral Foot .Severity of Tissue Pre Debridement is: Fat layer exposed. There was a Chemical/Enzymatic/Mechanical debridement performed  by Yevonne PaxEpps, Carrie, RN.Marland Kitchen. Agent used was [employer]ant[employer]. A time out was conducted at 10:51, prior to the start of the procedure. There was no bleeding. The procedure was tolerated well. Post Debridement Measurements: 2.5cm length x 2.4cm width x 0.1cm depth; 0.471cm^3 volume. Character of Wound/Ulcer Post Debridement requires further debridement. Severity of Tissue Post Debridement is: Fat layer exposed. Post procedure Diagnosis Wound #4: Same as Pre-Procedure Plan Follow-up Appointments: Return Appointment in 1 week. Dressing Change Frequency: Change dressing every day. - ALL wounds Wound Cleansing: May shower and wash wound with soap and water. - use hibiclens soap to shower  daily for 1 week then weekly thereafter Primary Wound Dressing: Wound #1 Right,Lateral Foot: Santyl Ointment Wound #2 Right,Medial T Great: oe Calcium Alginate with Silver Wound #3 Right T Great: oe Calcium Alginate with Silver Wound #4 Left,Lateral Foot: Santyl Ointment Wound #5 Right,Lateral Lower Leg: Other: - mupirocin/betamethasone cream per dermatology Secondary Dressing: Wound #1 Right,Lateral Foot: Kerlix/Rolled Gauze Dry Gauze Wound #2 Right,Medial T Great: oe Kerlix/Rolled Gauze Dry Gauze Wound #3 Right T Great: oe Kerlix/Rolled Gauze Dry Gauze Wound #4 Left,Lateral Foot: Kerlix/Rolled Gauze Dry Gauze Off-Loading: Removable cast walker boot to: - cam boots both feet to ambulate The following medication(s) was prescribed: Santyl topical 250 unit/gram ointment ointment topical Apply nickel thick daily to the wound bed and then cover with a dressing as directed in clinic x 30 days starting 09/15/2020 1. Upon evaluation today I do believe that this patient would benefit from the use of Santyl for several of these wounds where she does have necrotic tissue noted. I did actually go ahead and [facility] The wounds were using the Santyl is on the right lateral foot and left lateral foot. 2. In regard to the other wound locations will use a silver alginate dressing for the toe ulcer at this time. I think that the best way to go. In regard to the other area on the leg I think that using the combination of the mupirocin along with the betamethasone prescribed by her dermatologist is appropriate. 3. With regard to pressure relief I will make sure she does not have any issues coming from the cam walker boots. Obviously we will keep a close eye on this but I think that with the appropriate dressings and close monitoring she should be okay. We will see patient back for reevaluation in 1 week here in the clinic. If anything worsens or  changes patient will contact our office for additional recommendations. Electronic Signature(s) Signed: 09/15/2020 4:48:03 PM By: Lenda KelpStone III, Naevia Unterreiner PA-C Entered By: Lenda KelpStone III, Shemuel Harkleroad on 09/15/2020 16:48:03 -------------------------------------------------------------------------------- HxROS Details Patient Name: Date of Service: [name], [name] 09/15/2020 9:00 A M Medical Record Number: [id number] Patient Account Number: [id number] Date of Birth/Sex: Treating RN: 07/28/1966 (54 y.o. Female) Zandra AbtsLynch, Shatara Primary Care Provider: Elizabeth PalauAnderson, [name] Other Clinician: Referring Provider: Treating Provider/Extender: Duanne LimerickStone III, Crystian Frith Anderson, [name] [name]: 0 Information Obtained From Patient Constitutional Symptoms (General Health) Complaints and Symptoms: Negative for: Fatigue; Fever; Chills; Marked Weight Change Eyes Complaints and Symptoms: Positive for: Vision Changes - blurry vision Negative for: Dry Eyes; Glasses / Contacts Ear/Nose/Mouth/Throat Complaints and Symptoms: Negative for: Chronic sinus problems or rhinitis Gastrointestinal Complaints and Symptoms: Negative for: Frequent diarrhea; Nausea; Vomiting Endocrine Complaints and Symptoms: Negative for: Heat/cold intolerance Medical History: Positive for: Type II Diabetes - steroid induced Integumentary (Skin) Complaints and Symptoms: Positive for: Wounds - bilateral foot wounds Hematologic/Lymphatic Medical History:  Positive for: Anemia Respiratory Medical History: Positive for: Sleep Apnea Cardiovascular Medical History: Positive for: Hypotension Past Medical History Notes: Wegener's granulomatosis Genitourinary Medical History: Past Medical History Notes: CKD stage 3, Adrenal Insufficiency Immunological Musculoskeletal Medical History: Positive for: Osteoarthritis Past Medical History Notes: Osteochondritis Neurologic Medical History: Positive for: Neuropathy Oncologic Medical  History: Positive for: Received Chemotherapy Immunizations Pneumococcal Vaccine: Received Pneumococcal Vaccination: Yes Implantable Devices Yes Family and Social History Diabetes: Yes - Mother; Heart Disease: Yes - Mother; Kidney Disease: Yes - Father,Mother; Lung Disease: No; Seizures: No; Stroke: No; Thyroid Problems: No; Tuberculosis: No; Never smoker; Marital Status - Married; Alcohol Use: Never; Drug Use: No History; Caffeine Use: Never; Financial Concerns: No; Food, Clothing or Shelter Needs: No; Support System Lacking: No; Transportation Concerns: No Electronic Signature(s) Signed: 09/15/2020 5:04:05 PM By: Lenda Kelp PA-C Signed: 09/21/2020 6:12:47 PM By: Zandra Abts RN, BSN Entered By: Zandra Abts on 09/15/2020 10:49:10 -------------------------------------------------------------------------------- SuperBill Details Patient Name: Date of Service: [name], [name] 09/15/2020 Medical Record Number: [id number] Patient Account Number: [id number] Date of Birth/Sex: Treating RN: 1966/06/24 (54 y.o. Female) Zenaida Deed Primary Care Provider: Elizabeth Palau Other Clinician: Referring Provider: Treating Provider/Extender: Duanne Limerick in Treatment: 0 Diagnosis Coding ICD-10 Codes Code Description [phone] Type 2 diabetes mellitus with foot ulcer L97.522 Non-pressure chronic ulcer of other part of left foot with fat layer exposed L97.512 Non-pressure chronic ulcer of other part of right foot with fat layer exposed L97.812 Non-pressure chronic ulcer of other part of right lower leg with fat layer exposed M31.31 Wegener's granulomatosis with renal involvement N18.9 Chronic kidney disease, unspecified Facility Procedures CPT4 Code: [id number] Description: 99213 - WOUND CARE VISIT-LEV 3 EST PT Modifier: 25 Quantity: 1 CPT4 Code: [id number] Description: 32992 - DEBRIDE W/O ANES NON SELECT Modifier: Quantity: 1 Physician Procedures : CPT4  Code Description Modifier [id number] 99204 - WC PHYS LEVEL 4 - NEW PT ICD-10 Diagnosis Description E11.621 Type 2 diabetes mellitus with foot ulcer L97.522 Non-pressure chronic ulcer of other part of left foot with fat layer exposed L97.512  Non-pressure chronic ulcer of other part of right foot with fat layer exposed L97.812 Non-pressure chronic ulcer of other part of right lower leg with fat layer exposed Quantity: 1 Electronic Signature(s) Signed: 09/15/2020 4:48:23 PM By: Lenda Kelp PA-C Entered By: Lenda Kelp on 09/15/2020 16:48:22

## 2020-09-21 NOTE — Progress Notes (Signed)
[name] ([id number]) Visit Report for 09/15/2020 Allergy List Details Patient Name: Date of Service: [name], [name] 09/15/2020 9:00 A M Medical Record Number: [id number] Patient Account Number: [id number] Date of Birth/Sex: Treating RN: 08-27-1966 (54 y.o. Female) [name] Primary Care [name]: [name] Other Clinician: Referring [name]: Treating [name]/Extender: [name] in Treatment: 0 Allergies Active Allergies ibuprofen Severity: Moderate penicillin Reaction: rash Severity: Moderate Allergy Notes Electronic Signature(s) Signed: 09/21/2020 6:12:47 PM By: Stacey Abts RN, BSN Entered By: [name] on 09/15/2020 09:37:53 -------------------------------------------------------------------------------- Arrival Information Details Patient Name: Date of Service: [name], [name] 09/15/2020 9:00 A M Medical Record Number: [id number] Patient Account Number: [id number] Date of Birth/Sex: Treating RN: 1965/12/27 (54 y.o. Female) [name] Primary Care Charlesetta Milliron: [name] Other Clinician: Referring Geneive Sandstrom: Treating Katiria Calame/Extender: [name] in Treatment: 0 Visit Information Patient Arrived: Wheel Chair Arrival Time: 09:35 Accompanied By: son Transfer Assistance: None Patient Identification Verified: Yes Secondary Verification Process Completed: Yes Patient Requires Transmission-Based Precautions: No Patient Has Alerts: No Electronic Signature(s) Signed: 09/21/2020 6:12:47 PM By: Stacey Abts RN, BSN Entered By: [name] on 09/15/2020 09:36:11 -------------------------------------------------------------------------------- Clinic Level of Care Assessment Details Patient Name: Date of Service: [name] 09/15/2020 9:00 A M Medical Record Number: [id number] Patient Account Number: [id number] Date of Birth/Sex: Treating RN: 15-Apr-1966 (54 y.o. Female) Stacey Deed Primary Care Reynaldo Rossman: [name] Other Clinician: Referring Malgorzata Albert: Treating Nyellie Yetter/Extender: [name] in Treatment: 0 Clinic Level of Care Assessment Items TOOL 1 Quantity Score []  - 0 Use when EandM and Procedure is performed on INITIAL visit ASSESSMENTS - Nursing Assessment / Reassessment X- 1 20 General Physical Exam (combine w/ comprehensive assessment (listed just below) when performed on new pt. evals) X- 1 25 Comprehensive Assessment (HX, ROS, Risk Assessments, Wounds Hx, etc.) ASSESSMENTS - Wound and Skin Assessment / Reassessment []  - 0 Dermatologic / Skin Assessment (not related to wound area) ASSESSMENTS - Ostomy and/or Continence Assessment and Care []  - 0 Incontinence Assessment and Management []  - 0 Ostomy Care Assessment and Management (repouching, etc.) PROCESS - Coordination of Care X - Simple Patient / Family Education for ongoing care 1 15 []  - 0 Complex (extensive) Patient / Family Education for ongoing care X- 1 10 Staff obtains , Records, T Results / Process Orders est []  - 0 Staff telephones HHA, Nursing Homes / Clarify orders / etc []  - 0 Routine Transfer to another Facility (non-emergent condition) []  - 0 Routine Hospital Admission (non-emergent condition) X- 1 15 New Admissions / / Ordering NPWT Apligraf, etc. , []  - 0 Emergency Hospital Admission (emergent condition) PROCESS - Special Needs []  - 0 Pediatric / Minor Patient Management []  - 0 Isolation Patient Management []  - 0 Hearing / Language / Visual special needs []  - 0 Assessment of Community assistance (transportation, D/C planning, etc.) []  - 0 Additional assistance / Altered mentation []  - 0 Support Surface(s) Assessment (bed, cushion, seat, etc.) INTERVENTIONS - Miscellaneous []  - 0 External ear exam []  - 0 Patient Transfer (multiple staff / / Similar devices) []  - 0 Simple Staple  / Suture removal (25 or less) []  - 0 Complex Staple / Suture removal (26 or more) []  - 0 Hypo/Hyperglycemic Management (do not check if billed separately) X- 1 15 Ankle / Brachial Index (ABI) - do not check if billed separately Has the patient been seen at the hospital within the last three years: Yes Total  Score: 100 Level Of Care: New/Established - Level 3 Electronic Signature(s) Signed: 09/15/2020 4:58:27 PM By: Stacey DeedBoehlein, Linda RN, BSN Signed: 09/15/2020 4:58:27 PM By: Stacey DeedBoehlein, Linda RN, BSN Entered By: [name] on 09/15/2020 10:49:58 -------------------------------------------------------------------------------- Encounter Discharge Information Details Patient Name: Date of Service: [name], [name] 09/15/2020 9:00 A M Medical Record Number: [id number] Patient Account Number: [id number] Date of Birth/Sex: Treating RN: [birth year] (54 y.o. Female) [name] Primary Care Artavis Cowie: Stacey PalauAnderson, [name] Other Clinician: Referring Arlesia Kiel: Treating [name] in Treatment: 0 Encounter Discharge Information Items Post Procedure Vitals Discharge Condition: Stable Temperature (F): 98.2 Ambulatory Status: Wheelchair Pulse (bpm): 79 Discharge Destination: Home Respiratory Rate (breaths/min): 18 Transportation: Private Auto Blood Pressure (mmHg): 100/70 Accompanied By: son Schedule Follow-up Appointment: Yes Clinical Summary of Care: Electronic Signature(s) Signed: 09/15/2020 4:29:16 PM By: [name] Entered By: [name] on 09/15/2020 11:18:26 -------------------------------------------------------------------------------- Lower Extremity Assessment Details Patient Name: Date of Service: [name], [name] 09/15/2020 9:00 A M Medical Record Number: [id number] Patient Account Number: [id number] Date of Birth/Sex: Treating RN: [birth year] (54 y.o. Female) Stacey AbtsLynch, [name] Primary Care [name]: Stacey PalauAnderson, [name] Other  Clinician: Referring Stacey Maciolek: Treating Danetra Glock/Extender: Stacey LimerickStone III, Hoyt [name] [name] in Treatment: 0 Edema Assessment Assessed: [Left: No] [Right: No] Edema: [Left: No] [Right: No] Calf Left: Right: Point of Measurement: 30 cm From Medial Instep 36 cm 36.5 cm Ankle Left: Right: Point of Measurement: 9 cm From Medial Instep 21 cm 21 cm Vascular Assessment Pulses: Dorsalis Pedis Palpable: [Left:Yes] [Right:Yes] Blood Pressure: Brachial: [Left:100] [Right:100] Ankle: [Left:Dorsalis Pedis: 120] [Right:Dorsalis Pedis: 120] Ankle Brachial Index: [Left:1.20] [Right:1.20] Electronic Signature(s) Signed: 09/21/2020 6:12:47 PM By: Stacey AbtsLynch, Shatara RN, BSN Entered By: Stacey AbtsLynch, [name] on 09/15/2020 10:10:53 -------------------------------------------------------------------------------- Multi-Disciplinary Care Plan Details Patient Name: Date of Service: Stacey FlashHA [name], [name] 09/15/2020 9:00 A M Medical Record Number: [id number] Patient Account Number: [id number] Date of Birth/Sex: Treating RN: [birth year] (54 y.o. Female) [name] Primary Care Amarion Portell: Stacey PalauAnderson, [name] Other Clinician: Referring Claire Bridge: Treating Donie Lemelin/Extender: Stacey LimerickStone III, Hoyt [name] [name] in Treatment: 0 Active Inactive Abuse / Safety / Falls / Self Care Management Nursing Diagnoses: Potential for falls Goals: Patient/caregiver will verbalize/demonstrate measures taken to prevent injury and/or falls Date Initiated: 09/15/2020 Target Resolution Date: 10/13/2020 Goal Status: Active Interventions: Assess fall risk on admission and as needed Assess impairment of mobility on admission and as needed per policy Notes: Nutrition Nursing Diagnoses: Impaired glucose control: actual or potential Potential for alteratiion in Nutrition/Potential for imbalanced nutrition Goals: Patient/caregiver will maintain therapeutic glucose control Date Initiated: 09/15/2020 Target Resolution Date:  10/13/2020 Goal Status: Active Interventions: Assess HgA1c results as ordered upon admission and as needed Provide education on elevated blood sugars and impact on wound healing Treatment Activities: Patient referred to Primary Care Physician for further nutritional evaluation : 09/15/2020 Notes: Wound/Skin Impairment Nursing Diagnoses: Impaired tissue integrity Knowledge deficit related to ulceration/compromised skin integrity Goals: Patient/caregiver will verbalize understanding of skin care regimen Date Initiated: 09/15/2020 Target Resolution Date: 10/13/2020 Goal Status: Active Ulcer/skin breakdown will have a volume reduction of 30% by week 4 Date Initiated: 09/15/2020 Target Resolution Date: 10/13/2020 Goal Status: Active Interventions: Assess patient/caregiver ability to obtain necessary supplies Assess patient/caregiver ability to perform ulcer/skin care regimen upon admission and as needed Assess ulceration(s) every visit Provide education on ulcer and skin care Treatment Activities: Skin care regimen initiated : 09/15/2020 Topical wound management initiated : 09/15/2020 Notes: Electronic Signature(s) Signed: 09/15/2020 4:58:27 PM By: Stacey DeedBoehlein, Linda RN, BSN Entered By: [name] on 09/15/2020 10:47:34 --------------------------------------------------------------------------------  Pain Assessment Details Patient Name: Date of Service: JODENE, POLYAK 09/15/2020 9:00 A M Medical Record Number: [id number] Patient Account Number: [id number] Date of Birth/Sex: Treating RN: August 09, 1966 (54 y.o. Female) [name] Primary Care Ayomikun Starling: [name] Other Clinician: Referring Kwesi Sangha: Treating Shilah Hefel/Extender: [name] in Treatment: 0 Active Problems Location of Pain Severity and Description of Pain Patient Has Paino No Site Locations Pain Management and Medication Current Pain Management: Electronic  Signature(s) Signed: 09/21/2020 6:12:47 PM By: Stacey Abts RN, BSN Entered By: [name] on 09/15/2020 10:18:49 -------------------------------------------------------------------------------- Patient/Caregiver Education Details Patient Name: Date of Service: Stacey Axe 11/24/2021andnbsp9:00 A M Medical Record Number: [id number] Patient Account Number: [id number] Date of Birth/Gender: Treating RN: [birth year] (54 y.o. Female) Stacey Deed Primary Care Physician: [name] Other Clinician: Referring Physician: Treating Physician/Extender: [name] in Treatment: 0 Education Assessment Education Provided To: Patient Education Topics Provided Elevated Blood Sugar/ Impact on Healing: Handouts: Elevated Blood Sugars: How Do They Affect Wound Healing Methods: Explain/Verbal, Printed Responses: Reinforcements needed, State content correctly [facility]: o Handouts: [facility] o Methods: Explain/Verbal, Printed Responses: Reinforcements needed, State content correctly Wound Debridement: Handouts: Wound Debridement Methods: Explain/Verbal, Printed Responses: Reinforcements needed, State content correctly Wound/Skin Impairment: Handouts: Caring for Your Ulcer, Skin Care Do's and Dont's Methods: Explain/Verbal, Printed Responses: Reinforcements needed, State content correctly Electronic Signature(s) Signed: 09/15/2020 4:58:27 PM By: Stacey Deed RN, BSN Entered By: Stacey Deed on 09/15/2020 10:48:31 -------------------------------------------------------------------------------- Wound Assessment Details Patient Name: Date of Service: [name], [name] 09/15/2020 9:00 A M Medical Record Number: [id number] Patient Account Number: [id number] Date of Birth/Sex: Treating RN: July 26, 1966 (54 y.o. Female) [name] Primary Care Ghazal Pevey: [name] Other Clinician: Referring  Ashleigh Arya: Treating Chandlar Guice/Extender: [name] in Treatment: 0 Wound Status Wound Number: 1 Primary Diabetic Wound/Ulcer of the Lower Extremity Etiology: Wound Location: Right, Lateral Foot Wound Open Wounding Event: Shear/Friction Status: Date Acquired: 08/23/2020 Comorbid Anemia, Sleep Apnea, Hypotension, Type II Diabetes, [name] Of Treatment: 0 History: Osteoarthritis, Neuropathy, Received Chemotherapy Clustered Wound: No Wound Measurements Length: (cm) 2.5 Width: (cm) 2 Depth: (cm) 0.1 Area: (cm) 3.927 Volume: (cm) 0.393 % Reduction in Area: 0% % Reduction in Volume: 0% Epithelialization: None Tunneling: No Undermining: No Wound Description Classification: Unable to visualize wound bed Wound Margin: Flat and Intact Exudate Amount: Medium Exudate Type: Serosanguineous Exudate Color: red, brown Foul Odor After Cleansing: No Slough/Fibrino Yes Wound Bed Granulation Amount: Small (1-33%) Exposed Structure Granulation Quality: Red Fascia Exposed: No Necrotic Amount: Large (67-100%) Fat Layer (Subcutaneous Tissue) Exposed: Yes Necrotic Quality: Eschar, Adherent Slough Tendon Exposed: No Muscle Exposed: No Joint Exposed: No Bone Exposed: No Treatment Notes Wound #1 (Right, Lateral Foot) 1. Cleanse With Wound Cleanser 3. Primary Dressing Applied Santyl 4. Secondary Dressing Dry Gauze Roll Gauze 5. Secured With [employer]) Signed: 09/21/2020 6:12:47 PM By: Stacey Abts RN, BSN Entered By: [name] on 09/15/2020 10:28:54 -------------------------------------------------------------------------------- Wound Assessment Details Patient Name: Date of Service: [name], [name] 09/15/2020 9:00 A M Medical Record Number: [id number] Patient Account Number: [id number] Date of Birth/Sex: Treating RN: Oct 16, 1966 (54 y.o. Female) [name] Primary Care Albaraa Swingle: [name] Other  Clinician: Referring Maryuri Warnke: Treating Cason Dabney/Extender: [name] in Treatment: 0 Wound Status Wound Number: 2 Primary Diabetic Wound/Ulcer of the Lower Extremity Etiology: Wound Location: Right, Medial T Great oe Wound Open Wounding Event: Shear/Friction Status: Date Acquired: 08/23/2020  Comorbid Anemia, Sleep Apnea, Hypotension, Type II Diabetes, [name] Of Treatment: 0 History: Osteoarthritis, Neuropathy, Received Chemotherapy Clustered Wound: No Wound Measurements Length: (cm) 2.3 Width: (cm) 1.7 Depth: (cm) 0.1 Area: (cm) 3.071 Volume: (cm) 0.307 % Reduction in Area: 0% % Reduction in Volume: 0% Wound Description Classification: Grade 2 Wound Margin: Flat and Intact Exudate Amount: Medium Exudate Type: Serosanguineous Exudate Color: red, brown Foul Odor After Cleansing: No Slough/Fibrino Yes Wound Bed Granulation Amount: Large (67-100%) Exposed Structure Granulation Quality: Pink, Pale Fascia Exposed: No Necrotic Amount: Small (1-33%) Fat Layer (Subcutaneous Tissue) Exposed: Yes Necrotic Quality: Adherent Slough Tendon Exposed: No Muscle Exposed: No Joint Exposed: No Bone Exposed: No Treatment Notes Wound #2 (Right, Medial Toe Great) 1. Cleanse With Wound Cleanser 3. Primary Dressing Applied Calcium Alginate Ag 4. Secondary Dressing Dry Gauze Roll Gauze 5. Secured With Medipore tape Notes explained the orders, dressings, frequency of change, and when to return to wound center. Electronic Signature(s) Signed: 09/21/2020 6:12:47 PM By: Stacey Abts RN, BSN Entered By: [name] on 09/15/2020 10:29:13 -------------------------------------------------------------------------------- Wound Assessment Details Patient Name: Date of Service: [name], [name] 09/15/2020 9:00 A M Medical Record Number: [id number] Patient Account Number: [id number] Date of Birth/Sex: Treating RN: 1966/04/26 (54 y.o. Female) [name] Primary Care Patte Winkel: [name] Other Clinician: Referring Meosha Castanon: Treating Hennessey Cantrell/Extender: [name] in Treatment: 0 Wound Status Wound Number: 3 Primary Diabetic Wound/Ulcer of the Lower Extremity Etiology: Wound Location: Right T Great oe Wound Open Wounding Event: Trauma Status: Date Acquired: 10/23/2017 Comorbid Anemia, Sleep Apnea, Hypotension, Type II Diabetes, [name] Of Treatment: 0 History: Osteoarthritis, Neuropathy, Received Chemotherapy Clustered Wound: No Wound Measurements Length: (cm) 3.4 Width: (cm) 2.4 Depth: (cm) 0.3 Area: (cm) 6.409 Volume: (cm) 1.923 % Reduction in Area: 0% % Reduction in Volume: 0% Epithelialization: None Tunneling: No Undermining: No Wound Description Classification: Grade 2 Wound Margin: Flat and Intact Exudate Amount: Medium Exudate Type: Serosanguineous Exudate Color: red, brown Foul Odor After Cleansing: No Slough/Fibrino Yes Wound Bed Granulation Amount: Medium (34-66%) Exposed Structure Granulation Quality: Pink Fascia Exposed: No Necrotic Amount: Medium (34-66%) Fat Layer (Subcutaneous Tissue) Exposed: Yes Necrotic Quality: Adherent Slough Tendon Exposed: No Muscle Exposed: No Joint Exposed: No Bone Exposed: No Treatment Notes Wound #3 (Right Toe Great) 1. Cleanse With Wound Cleanser 3. Primary Dressing Applied Calcium Alginate Ag 4. Secondary Dressing Dry Gauze Roll Gauze 5. Secured With Medipore tape Notes explained the orders, dressings, frequency of change, and when to return to wound center. Electronic Signature(s) Signed: 09/21/2020 6:12:47 PM By: Stacey Abts RN, BSN Entered By: [name] on 09/15/2020 10:29:41 -------------------------------------------------------------------------------- Wound Assessment Details Patient Name: Date of Service: [name], [name] 09/15/2020 9:00 A M Medical Record Number: [id number] Patient Account Number:  [id number] Date of Birth/Sex: Treating RN: [birth year] (54 y.o. Female) [name] Primary Care Yarelli Decelles: [name] Other Clinician: Referring Hamed Debella: Treating Rorey Bisson/Extender: [name] in Treatment: 0 Wound Status Wound Number: 4 Primary Diabetic Wound/Ulcer of the Lower Extremity Etiology: Wound Location: Left, Lateral Foot Wound Open Wounding Event: Shear/Friction Status: Date Acquired: 08/23/2020 Comorbid Anemia, Sleep Apnea, Hypotension, Type II Diabetes, [name] Of Treatment: 0 History: Osteoarthritis, Neuropathy, Received Chemotherapy Clustered Wound: No Wound Measurements Length: (cm) 2.5 Width: (cm) 2.4 Depth: (cm) 0.1 Area: (cm) 4.712 Volume: (cm) 0.471 % Reduction in Area: 0% % Reduction in Volume: 0% Epithelialization: None Tunneling: No Undermining: No Wound Description Classification: Unable to visualize wound bed Wound Margin: Flat and Intact Exudate Amount: Small  Exudate Type: Serosanguineous Exudate Color: red, brown Foul Odor After Cleansing: No Slough/Fibrino Yes Wound Bed Granulation Amount: None Present (0%) Exposed Structure Necrotic Amount: Large (67-100%) Fascia Exposed: No Necrotic Quality: Eschar Fat Layer (Subcutaneous Tissue) Exposed: No Tendon Exposed: No Muscle Exposed: No Joint Exposed: No Bone Exposed: No Treatment Notes Wound #4 (Left, Lateral Foot) 1. Cleanse With Wound Cleanser 3. Primary Dressing Applied Santyl 4. Secondary Dressing Dry Gauze Roll Gauze 5. Secured With [employer]) Signed: 09/21/2020 6:12:47 PM By: Stacey Abts RN, BSN Entered By: [name] on 09/15/2020 10:30:02 -------------------------------------------------------------------------------- Wound Assessment Details Patient Name: Date of Service: [name], [name] 09/15/2020 9:00 A M Medical Record Number: [id number] Patient Account Number: [id number] Date of  Birth/Sex: Treating RN: 1966/02/18 (54 y.o. Female) [name] Primary Care Sheelah Ritacco: [name] Other Clinician: Referring Kayli Beal: Treating Jahne Krukowski/Extender: [name] in Treatment: 0 Wound Status Wound Number: 5 Primary Lesion Etiology: Wound Location: Right, Lateral Lower Leg Wound Open Wounding Event: Gradually Appeared Status: Date Acquired: 08/23/2020 Comorbid Anemia, Sleep Apnea, Hypotension, Osteoarthritis, Neuropathy, [name] Of Treatment: 0 History: Received Chemotherapy Clustered Wound: No Wound Measurements Length: (cm) 0.6 Width: (cm) 0.5 Depth: (cm) 0.1 Area: (cm) 0.236 Volume: (cm) 0.024 % Reduction in Area: % Reduction in Volume: Epithelialization: None Tunneling: No Undermining: No Wound Description Classification: Full Thickness Without Exposed Support Structures Wound Margin: Flat and Intact Exudate Amount: Medium Exudate Type: Serosanguineous Exudate Color: red, brown Foul Odor After Cleansing: No Slough/Fibrino Yes Wound Bed Granulation Amount: Medium (34-66%) Exposed Structure Granulation Quality: Red, Pink Fascia Exposed: No Necrotic Amount: Medium (34-66%) Fat Layer (Subcutaneous Tissue) Exposed: Yes Necrotic Quality: Adherent Slough Tendon Exposed: No Muscle Exposed: No Joint Exposed: No Bone Exposed: No Treatment Notes Wound #5 (Right, Lateral Lower Leg) 1. Cleanse With Wound Cleanser 3. Primary Dressing Applied Other primary dressing (specifiy in notes) 4. Secondary Dressing Dry Gauze Roll Gauze 5. Secured With [employer] tape Notes bactroban ointment as directed by PA . Electronic Signature(s) Signed: 09/21/2020 6:12:47 PM By: Stacey Abts RN, BSN Entered By: [name] on 09/15/2020 10:18:29 -------------------------------------------------------------------------------- Vitals Details Patient Name: Date of Service: [name], [name] 09/15/2020 9:00 A M Medical Record Number:  [id number] Patient Account Number: [id number] Date of Birth/Sex: Treating RN: May 30, 1966 (55 y.o. Female) [name] Primary Care January Bergthold: [name] Other Clinician: Referring Mirah Nevins: Treating Kam Kushnir/Extender: [name] in Treatment: 0 Vital Signs Time Taken: 09:36 Temperature (F): 98.2 Height (in): 66 Pulse (bpm): 79 Source: Stated Respiratory Rate (breaths/min): 18 Weight (lbs): 195 Blood Pressure (mmHg): 100/70 Source: Stated Reference Range: 80 - 120 mg / dl Body Mass Index (BMI): 31.5 Electronic Signature(s) Signed: 09/21/2020 6:12:47 PM By: Stacey Abts RN, BSN Entered By: [name] on 09/15/2020 09:36:40

## 2020-09-22 ENCOUNTER — Encounter (HOSPITAL_BASED_OUTPATIENT_CLINIC_OR_DEPARTMENT_OTHER): Payer: BC Managed Care – PPO | Attending: Physician Assistant | Admitting: Physician Assistant

## 2020-09-22 ENCOUNTER — Other Ambulatory Visit: Payer: Self-pay

## 2020-09-22 DIAGNOSIS — L97522 Non-pressure chronic ulcer of other part of left foot with fat layer exposed: Secondary | ICD-10-CM | POA: Insufficient documentation

## 2020-09-22 DIAGNOSIS — B9562 Methicillin resistant Staphylococcus aureus infection as the cause of diseases classified elsewhere: Secondary | ICD-10-CM | POA: Diagnosis not present

## 2020-09-22 DIAGNOSIS — N189 Chronic kidney disease, unspecified: Secondary | ICD-10-CM | POA: Insufficient documentation

## 2020-09-22 DIAGNOSIS — E1122 Type 2 diabetes mellitus with diabetic chronic kidney disease: Secondary | ICD-10-CM | POA: Diagnosis not present

## 2020-09-22 DIAGNOSIS — Z7984 Long term (current) use of oral hypoglycemic drugs: Secondary | ICD-10-CM | POA: Insufficient documentation

## 2020-09-22 DIAGNOSIS — E11621 Type 2 diabetes mellitus with foot ulcer: Secondary | ICD-10-CM | POA: Diagnosis not present

## 2020-09-22 DIAGNOSIS — L97812 Non-pressure chronic ulcer of other part of right lower leg with fat layer exposed: Secondary | ICD-10-CM | POA: Insufficient documentation

## 2020-09-22 DIAGNOSIS — M3131 Wegener's granulomatosis with renal involvement: Secondary | ICD-10-CM | POA: Insufficient documentation

## 2020-09-22 DIAGNOSIS — Z89422 Acquired absence of other left toe(s): Secondary | ICD-10-CM | POA: Diagnosis not present

## 2020-09-22 DIAGNOSIS — Z89421 Acquired absence of other right toe(s): Secondary | ICD-10-CM | POA: Diagnosis not present

## 2020-09-22 DIAGNOSIS — L97512 Non-pressure chronic ulcer of other part of right foot with fat layer exposed: Secondary | ICD-10-CM | POA: Insufficient documentation

## 2020-09-22 NOTE — Progress Notes (Addendum)
Stacey, [name] ([id number]) Visit Report for 09/22/2020 Chief Complaint Document Details Patient Name: Date of Service: [name], [name] 09/22/2020 11:00 A M Medical Record Number: [id number] Patient Account Number: [id number] Date of Birth/Sex: Treating RN: 1966-03-03 (54 y.o. Female) [name] Primary Care Provider: Elizabeth Palau Other Clinician: Referring Provider: Treating Provider/Extender: Doristine Counter in Treatment: 1 Information Obtained from: Patient Chief Complaint Bilateral LE Ulcers Electronic Signature(s) Signed: 09/22/2020 11:41:12 AM By: Lenda Kelp PA-C Entered By: Lenda Kelp on 09/22/2020 11:41:12 -------------------------------------------------------------------------------- Debridement Details Patient Name: Date of Service: Stacey, [name] 09/22/2020 11:00 A M Medical Record Number: [id number] Patient Account Number: [id number] Date of Birth/Sex: Treating RN: 1966/04/19 (54 y.o. Female) Fonnie Mu Primary Care Provider: Elizabeth Palau Other Clinician: Referring Provider: Treating Provider/Extender: Maryan Char [name]: 1 Debridement Performed for Assessment: Wound #4 Left,Lateral Foot Performed By: Physician Lenda Kelp, PA Debridement Type: Debridement Severity of Tissue Pre Debridement: Fat layer exposed Level of Consciousness (Pre-procedure): Awake and Alert Pre-procedure Verification/Time Out Yes - 12:45 Taken: Start Time: 01:24 Pain Control: Other : Benzocaine T Area Debrided (L x W): otal 3 (cm) x 2.5 (cm) = 7.5 (cm) Tissue and other material debrided: Non-Viable, Slough, Subcutaneous, Slough Level: Skin/Subcutaneous Tissue Debridement Description: Excisional Instrument: Curette Bleeding: None End Time: 12:46 Procedural Pain: 0 Post Procedural Pain: 0 Response to Treatment: Procedure was tolerated well Level of Consciousness (Post- Awake and Alert procedure): Post  Debridement Measurements of Total Wound Length: (cm) 3 Width: (cm) 2.5 Depth: (cm) 0.1 Volume: (cm) 0.589 Character of Wound/Ulcer Post Debridement: Improved Severity of Tissue Post Debridement: Fat layer exposed Post Procedure Diagnosis Same as Pre-procedure Electronic Signature(s) Signed: 09/22/2020 4:34:22 PM By: Lenda Kelp PA-C Signed: 09/23/2020 5:27:06 PM By: Fonnie Mu RN Entered By: Fonnie Mu on 09/22/2020 12:48:26 -------------------------------------------------------------------------------- Debridement Details Patient Name: Date of Service: Stacey Flash, [name] 09/22/2020 11:00 A M Medical Record Number: [id number] Patient Account Number: [id number] Date of Birth/Sex: Treating RN: Jan 21, 1966 (53 y.o. Female) Fonnie Mu Primary Care Provider: Elizabeth Palau Other Clinician: Referring Provider: Treating Provider/Extender: Maryan Char [name]: 1 Debridement Performed for Assessment: Wound #1 Right,Lateral Foot Performed By: Physician Lenda Kelp, PA Debridement Type: Chemical/Enzymatic/Mechanical Agent Used: Santyl Severity of Tissue Pre Debridement: Fat layer exposed Level of Consciousness (Pre-procedure): Awake and Alert Pre-procedure Verification/Time Out Yes - 12:45 Taken: Start Time: 12:45 Pain Control: Other : Benzocaine Instrument: Curette Bleeding: None End Time: 12:46 Procedural Pain: 0 Post Procedural Pain: 0 Response to Treatment: Procedure was tolerated well Level of Consciousness (Post- Awake and Alert procedure): Post Debridement Measurements of Total Wound Length: (cm) 1.6 Width: (cm) 0.8 Depth: (cm) 0.1 Volume: (cm) 0.101 Character of Wound/Ulcer Post Debridement: Improved Severity of Tissue Post Debridement: Fat layer exposed Post Procedure Diagnosis Same as Pre-procedure Electronic Signature(s) Signed: 09/22/2020 4:34:22 PM By: Lenda Kelp PA-C Signed: 09/23/2020 5:27:06 PM By:  Fonnie Mu RN Entered By: Fonnie Mu on 09/22/2020 12:50:18 -------------------------------------------------------------------------------- Debridement Details Patient Name: Date of Service: Stacey Flash, [name] 09/22/2020 11:00 A M Medical Record Number: [id number] Patient Account Number: [id number] Date of Birth/Sex: Treating RN: 1966-04-03 (54 y.o. Female) [name] Primary Care Provider: Elizabeth Palau Other Clinician: Referring Provider: Treating Provider/Extender: Doristine Counter in Treatment: 1 Debridement Performed for Assessment: Wound #3 Right T [place] Performed By: Clinician Stacey Deed, RN Debridement Type: Chemical/Enzymatic/Mechanical Agent Used: gauze Severity of Tissue Pre Debridement: Fat layer exposed Level of  Consciousness (Pre-procedure): Awake and Alert Pre-procedure Verification/Time Out Yes - 12:45 Taken: Start Time: 01:24 Pain Control: Other : Benzocaine Instrument: Curette Bleeding: None End Time: 12:46 Procedural Pain: 0 Post Procedural Pain: 0 Response to Treatment: Procedure was tolerated well Level of Consciousness (Post- Awake and Alert procedure): Post Debridement Measurements of Total Wound Length: (cm) 2.4 Width: (cm) 3.4 Depth: (cm) 0.3 Volume: (cm) 1.923 Character of Wound/Ulcer Post Debridement: Improved Severity of Tissue Post Debridement: Fat layer exposed Post Procedure Diagnosis Same as Pre-procedure Electronic Signature(s) Signed: 09/22/2020 4:34:22 PM By: Lenda Kelp PA-C Signed: 09/22/2020 5:21:24 PM By: Stacey Deed RN, BSN Entered By: [name] on 09/22/2020 14:17:54 -------------------------------------------------------------------------------- HPI Details Patient Name: Date of Service: Stacey Flash, [name] 09/22/2020 11:00 A M Medical Record Number: [id number] Patient Account Number: [id number] Date of Birth/Sex: Treating RN: [birth year] (54 y.o. Female) [name] Primary Care Provider: Elizabeth Palau Other Clinician: Referring Provider: Treating Provider/Extender: Maryan Char [name]: 1 History of Present Illness HPI Description: 09/15/2020 upon evaluation today patient appears to be doing somewhat poorly in regard to the wounds on her feet. She has a wound on the right lateral foot, right medial great toe, right dorsal great toe, left lateral foot, and right lateral lower leg. Currently based on what we are seeing the patient states that the worst issue is her right great toe. She dates that she wishes Dr. Victorino Dike had actually gone ahead and performed amputation at this site as well when he did the other toes they have all healed well that is the right third toe, left great toe, and left third toe. All are completely healed and did excellent. Currently she has been transition into Cam boots for both feet previously she was using postop shoes but apparently this caused some rubbing on the lateral aspects of both feet and at the medial aspect of her right great toe which unfortunately has caused her a lot of discomfort and issues. She has been given mupirocin and betamethasone for some of the small wounds that occur over her legs and arms frequently. 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 Otherwise she also does have diabetes mellitus type 2. Currently the patient is on methotrexate and also undergoes chemotherapy infusions every 6 months for the Wegener's granulomatosis currently her amputations were asked on August 12, 2020 that was with Dr. Victorino Dike. His physician assistant Jill Alexanders referred her to me today. 09/22/2020 upon evaluation today patient appears to be making good progress with the Santyl at this point. I am extremely pleased with where things stand today and I do believe him to be able to  debride some of the left lateral foot wound which we have not been able to do as the eschar was too thick. I think this is doing much better currently. Electronic Signature(s) Signed: 09/22/2020 1:22:58 PM By: Lenda Kelp PA-C Entered By: Lenda Kelp on 09/22/2020 13:22:58 -------------------------------------------------------------------------------- Physical Exam Details Patient Name: Date of Service: LOREEN, BANKSON 09/22/2020 11:00 A M Medical Record Number: [id number] Patient Account Number: [id number] Date of Birth/Sex: Treating RN: 1966/04/12 (54 y.o. Female) [name] Primary Care Provider: Elizabeth Palau Other Clinician: Referring Provider: Treating Provider/Extender: Maryan Char [name]: 1 Constitutional Well-nourished and well-hydrated in no acute distress. Respiratory normal breathing without difficulty. Psychiatric this patient is able to make decisions and demonstrates good insight  into disease process. Alert and Oriented x 3. pleasant and cooperative. Notes I did actually perform sharp debridement today to clear away some of the necrotic debris from the surface of the wound on the left lateral foot. She tolerated that today without complication post debridement the wound bed appears to be doing much better which is great news. Electronic Signature(s) Signed: 09/22/2020 1:23:20 PM By: Lenda Kelp PA-C Entered By: Lenda Kelp on 09/22/2020 13:23:19 -------------------------------------------------------------------------------- Physician Orders Details Patient Name: Date of Service: JAELA, YEPEZ 09/22/2020 11:00 A M Medical Record Number: [id number] Patient Account Number: [id number] Date of Birth/Sex: Treating RN: [birth year] (54 y.o. Female) Fonnie Mu Primary Care Provider: Elizabeth Palau Other Clinician: Referring Provider: Treating Provider/Extender: Doristine Counter in Treatment:  1 Verbal / Phone Orders: No Diagnosis Coding ICD-10 Coding Code Description E11.621 Type 2 diabetes mellitus with foot ulcer L97.522 Non-pressure chronic ulcer of other part of left foot with fat layer exposed L97.512 Non-pressure chronic ulcer of other part of right foot with fat layer exposed L97.812 Non-pressure chronic ulcer of other part of right lower leg with fat layer exposed 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 Dressing Change Frequency Change dressing every day. - ALL wounds Wound Cleansing May shower and wash wound with soap and water. - use hibiclens soap to shower daily for 1 week then weekly thereafter Primary Wound Dressing Wound #1 Right,Lateral Foot Santyl Ointment Wound #2 Right,Medial T Great oe Calcium Alginate with Silver Wound #3 Right T Great oe Calcium Alginate with Silver Wound #4 Left,Lateral Foot Santyl Ointment Wound #5 Right,Lateral Lower Leg Other: - mupirocin/betamethasone cream per dermatology Secondary Dressing Wound #1 Right,Lateral Foot Kerlix/Rolled Gauze Dry Gauze Wound #2 Right,Medial T Great oe Kerlix/Rolled Gauze Dry Gauze Wound #3 Right T Great oe Kerlix/Rolled Gauze Dry Gauze Wound #4 Left,Lateral Foot Kerlix/Rolled Gauze Dry Gauze Off-Loading Removable cast walker boot to: - cam boots both feet to ambulate Electronic Signature(s) Signed: 09/22/2020 4:34:22 PM By: Lenda Kelp PA-C Signed: 09/23/2020 5:27:06 PM By: Fonnie Mu RN Entered By: Fonnie Mu on 09/22/2020 12:49:07 -------------------------------------------------------------------------------- Problem List Details Patient Name: Date of Service: Stacey Flash, Renea 09/22/2020 11:00 A M Medical Record Number: [id number] Patient Account Number: [id number] Date of Birth/Sex: Treating RN: 06/16/1966 (54 y.o. Female) [name] Primary Care  Provider: Elizabeth Palau Other Clinician: Referring Provider: Treating Provider/Extender: Doristine Counter in Treatment: 1 Active Problems ICD-10 Encounter Code Description Active Date MDM Diagnosis E11.621 Type 2 diabetes mellitus with foot ulcer 09/15/2020 No Yes L97.522 Non-pressure chronic ulcer of other part of left foot with fat layer exposed 09/15/2020 No Yes L97.512 Non-pressure chronic ulcer of other part of right foot with fat layer exposed 09/15/2020 No Yes L97.812 Non-pressure chronic ulcer of other part of right lower leg with fat layer 09/15/2020 No Yes exposed M31.31 Wegener's granulomatosis with renal involvement 09/15/2020 No Yes N18.9 Chronic kidney disease, unspecified 09/15/2020 No Yes Inactive Problems Resolved Problems Electronic Signature(s) Signed: 09/22/2020 11:40:42 AM By: Lenda Kelp PA-C Entered By: Lenda Kelp on 09/22/2020 11:40:41 -------------------------------------------------------------------------------- Progress Note Details Patient Name: Date of Service: HA JAMICIA, HAALAND 09/22/2020 11:00 A M Medical Record Number: [id number] Patient Account Number: [id number] Date of Birth/Sex: Treating RN: [birth year] (54 y.o. Female) [name] Primary Care Provider: Elizabeth Palau Other Clinician: Referring Provider: Treating Provider/Extender: Doristine Counter in Treatment: 1 Subjective Chief Complaint Information obtained  from Patient Bilateral LE Ulcers History of Present Illness (HPI) 09/15/2020 upon evaluation today patient appears to be doing somewhat poorly in regard to the wounds on her feet. She has a wound on the right lateral foot, right medial great toe, right dorsal great toe, left lateral foot, and right lateral lower leg. Currently based on what we are seeing the patient states that the worst issue is her right great toe. She dates that she wishes Dr. Victorino Dike had actually gone ahead and  performed amputation at this site as well when he did the other toes they have all healed well that is the right third toe, left great toe, and left third toe. All are completely healed and did excellent. Currently she has been transition into Cam boots for both feet previously she was using postop shoes but apparently this caused some rubbing on the lateral aspects of both feet and at the medial aspect of her right great toe which unfortunately has caused her a lot of discomfort and issues. She has been given mupirocin and betamethasone for some of the small wounds that occur over her legs and arms frequently. 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 Otherwise she also does have diabetes mellitus type 2. Currently the patient is on methotrexate and also undergoes chemotherapy infusions every 6 months for the Wegener's granulomatosis currently her amputations were asked on August 12, 2020 that was with Dr. Victorino Dike. His physician assistant Jill Alexanders referred her to me today. 09/22/2020 upon evaluation today patient appears to be making good progress with the Santyl at this point. I am extremely pleased with where things stand today and I do believe him to be able to debride some of the left lateral foot wound which we have not been able to do as the eschar was too thick. I think this is doing much better currently. Objective Constitutional Well-nourished and well-hydrated in no acute distress. Vitals Time Taken: 11:30 AM, Height: 66 in, Weight: 195 lbs, BMI: 31.5, Temperature: 98.4 F, Pulse: 79 bpm, Respiratory Rate: 16 breaths/min, Blood Pressure: 106/68 mmHg. Respiratory normal breathing without difficulty. Psychiatric this patient is able to make decisions and demonstrates good insight into disease process. Alert and Oriented x 3. pleasant and cooperative. General Notes: I  did actually perform sharp debridement today to clear away some of the necrotic debris from the surface of the wound on the left lateral foot. She tolerated that today without complication post debridement the wound bed appears to be doing much better which is great news. Integumentary (Hair, Skin) Wound #1 status is Open. Original cause of wound was Shear/Friction. The wound is located on the Right,Lateral Foot. The wound measures 1.6cm length x 0.8cm width x 0.1cm depth; 1.005cm^2 area and 0.101cm^3 volume. There is Fat Layer (Subcutaneous Tissue) exposed. There is no tunneling or undermining noted. There is a medium amount of serosanguineous drainage noted. The wound margin is flat and intact. There is large (67-100%) red granulation within the wound bed. There is a small (1-33%) amount of necrotic tissue within the wound bed including Adherent Slough. Wound #2 status is Open. Original cause of wound was Shear/Friction. The wound is located on the Right,Medial T Great. The wound measures 2.2cm length oe x 1.7cm width x 0.1cm depth; 2.937cm^2 area and 0.294cm^3 volume. There is Fat Layer (Subcutaneous Tissue) exposed. There is no tunneling or undermining noted. There is a medium amount  of serosanguineous drainage noted. The wound margin is flat and intact. There is large (67-100%) pink, pale granulation within the wound bed. There is a small (1-33%) amount of necrotic tissue within the wound bed including Adherent Slough. Wound #3 status is Open. Original cause of wound was Trauma. The wound is located on the Right T Great. The wound measures 2.4cm length x 3.4cm width oe x 0.3cm depth; 6.409cm^2 area and 1.923cm^3 volume. There is Fat Layer (Subcutaneous Tissue) exposed. There is no tunneling noted. There is a medium amount of serosanguineous drainage noted. The wound margin is epibole. There is large (67-100%) pink granulation within the wound bed. There is a small (1- 33%) amount of necrotic  tissue within the wound bed including Adherent Slough. Wound #4 status is Open. Original cause of wound was Shear/Friction. The wound is located on the Left,Lateral Foot. The wound measures 3cm length x 2.5cm width x 0.1cm depth; 5.89cm^2 area and 0.589cm^3 volume. There is no tunneling or undermining noted. There is a medium amount of serosanguineous drainage noted. The wound margin is flat and intact. There is small (1-33%) pink granulation within the wound bed. There is a large (67-100%) amount of necrotic tissue within the wound bed including Adherent Slough. Wound #5 status is Open. Original cause of wound was Gradually Appeared. The wound is located on the Right,Lateral Lower Leg. The wound measures 0.2cm length x 0.2cm width x 0.1cm depth; 0.031cm^2 area and 0.003cm^3 volume. There is Fat Layer (Subcutaneous Tissue) exposed. There is no tunneling or undermining noted. There is a medium amount of serosanguineous drainage noted. The wound margin is flat and intact. There is large (67-100%) red, pink granulation within the wound bed. There is no necrotic tissue within the wound bed. 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 There was a Chemical/Enzymatic/Mechanical debridement performed by Lenda Kelp, PA. With the following instrument(s): Curette to remove Non-Viable tissue/material. Material removed includes Subcutaneous Tissue and Slough and  after achieving pain control using Other (Benzocaine). Agent used was [employer]. A time out was conducted at 12:45, prior to the start of the procedure. There was no bleeding. The procedure was tolerated well with a pain level of 0 throughout and a pain level of 0 following the procedure. Post Debridement Measurements: 1.6cm length x 0.8cm width x 0.1cm depth; 0.101cm^3 volume. Character of Wound/Ulcer Post Debridement is improved. Severity of Tissue Post Debridement is: Fat layer exposed. Post procedure Diagnosis Wound #1: Same as Pre-Procedure Wound #4 Pre-procedure diagnosis of Wound #4 is a Diabetic Wound/Ulcer of the Lower Extremity located on the Left,Lateral Foot .Severity of Tissue Pre Debridement is: Fat layer exposed. There was a Excisional Skin/Subcutaneous Tissue Debridement with a total area of 7.5 sq cm performed by Lenda Kelp, PA. With the following instrument(s): Curette to remove Non-Viable tissue/material. Material removed includes Subcutaneous Tissue and Slough and after achieving pain control using Other (Benzocaine). No specimens were taken. A time out was conducted at 12:45, prior to the start of the procedure. There was  no bleeding. The procedure was tolerated well with a pain level of 0 throughout and a pain level of 0 following the procedure. Post Debridement Measurements: 3cm length x 2.5cm width x 0.1cm depth; 0.589cm^3 volume. Character of Wound/Ulcer Post Debridement is improved. Severity of Tissue Post Debridement is: Fat layer exposed. Post procedure Diagnosis Wound #4: Same as Pre-Procedure Plan Follow-up Appointments: Return Appointment in 1 week. Dressing Change Frequency: Change dressing every day. - ALL wounds Wound Cleansing: May shower and wash wound with soap and water. - use hibiclens soap to shower daily for 1 week then weekly thereafter Primary Wound Dressing: Wound #1 Right,Lateral Foot: Santyl Ointment Wound #2 Right,Medial T Great: oe Calcium  Alginate with Silver Wound #3 Right T Great: oe Calcium Alginate with Silver Wound #4 Left,Lateral Foot: Santyl Ointment Wound #5 Right,Lateral Lower Leg: Other: - mupirocin/betamethasone cream per dermatology Secondary Dressing: Wound #1 Right,Lateral Foot: Kerlix/Rolled Gauze Dry Gauze Wound #2 Right,Medial T Great: oe Kerlix/Rolled Gauze Dry Gauze Wound #3 Right T Great: oe Kerlix/Rolled Gauze Dry Gauze Wound #4 Left,Lateral Foot: Kerlix/Rolled Gauze Dry Gauze Off-Loading: Removable cast walker boot to: - cam boots both feet to ambulate 1. I would recommend currently that we continue with the Santyl which I feel like is doing a good job for the patient. That she used on the lateral feet bilaterally. 2. I am also can recommend we stick with the silver alginate dressing for the great toe which I think is also doing better. 3. I am also can recommend the patient continue to elevate her legs to help with edema control. We will see patient back for reevaluation in 1 week here in the clinic. If anything worsens or changes patient will contact our office for additional recommendations. Electronic Signature(s) Signed: 09/22/2020 1:23:54 PM By: Lenda Kelp PA-C Entered By: Lenda Kelp on 09/22/2020 13:23:54 -------------------------------------------------------------------------------- SuperBill Details Patient Name: Date of Service: MARIADEL, MRUK 09/22/2020 Medical Record Number: [id number] Patient Account Number: [id number] Date of Birth/Sex: Treating RN: Feb 16, 1966 (54 y.o. Female) Fonnie Mu Primary Care Provider: Elizabeth Palau Other Clinician: Referring Provider: Treating Provider/Extender: Maryan Char [name]: 1 Diagnosis Coding ICD-10 Codes Code Description [id number] Type 2 diabetes mellitus with foot ulcer L97.522 Non-pressure chronic ulcer of other part of left foot with fat layer exposed L97.512 Non-pressure chronic ulcer  of other part of right foot with fat layer exposed L97.812 Non-pressure chronic ulcer of other part of right lower leg with fat layer exposed M31.31 Wegener's granulomatosis with renal involvement N18.9 Chronic kidney disease, unspecified Facility Procedures CPT4 Code: 33825053 Description: 97673 - DEBRIDE W/O ANES NON SELECT Modifier: 59 Quantity: 1 CPT4 Code: [id number] Description: 11042 - DEB SUBQ TISSUE 20 SQ CM/< ICD-10 Diagnosis Description L97.522 Non-pressure chronic ulcer of other part of left foot with fat layer exposed Modifier: Quantity: 1 Physician Procedures : CPT4 Code Description Modifier [id number] - WC PHYS LEVEL 4 - EST PT 25 ICD-10 Diagnosis Description E11.621 Type 2 diabetes mellitus with foot ulcer L97.522 Non-pressure chronic ulcer of other part of left foot with fat layer exposed L97.512  Non-pressure chronic ulcer of other part of right foot with fat layer exposed L97.812 Non-pressure chronic ulcer of other part of right lower leg with fat layer exposed Quantity: 1 : [id number] 11042 - WC PHYS SUBQ TISS 20 SQ CM ICD-10 Diagnosis Description L97.522 Non-pressure chronic ulcer of other part of left foot with fat layer exposed Quantity: 1 Electronic Signature(s) Signed: 09/22/2020 1:24:10 PM By:  Linwood DibblesStone III, Blayne Garlick PA-C Entered By: Lenda KelpStone III, Nathon Stefanski on 09/22/2020 13:24:09

## 2020-09-23 NOTE — Progress Notes (Signed)
HOLLAND, KOTTER ([id number]) Visit Report for 09/22/2020 Arrival Information Details Patient Name: Date of Service: [name], [name] 09/22/2020 11:00 A M Medical Record Number: [id number] Patient Account Number: [id number] Date of Birth/Sex: Treating RN: 04-08-1966 (54 y.o. Female) Elesa Hacker, [place] Primary Care Tylee Yum: Elizabeth Palau Other Clinician: Referring Iley Deignan: Treating Dandra Velardi/Extender: Doristine Counter in Treatment: 1 Visit Information History Since Last Visit Added or deleted any medications: No Patient Arrived: Wheel Chair Any new allergies or adverse reactions: No Arrival Time: 11:30 Had a fall or experienced change in No Accompanied By: self activities of daily living that may affect Transfer Assistance: None risk of falls: Patient Identification Verified: Yes Signs or symptoms of abuse/neglect since last visito No Secondary Verification Process Completed: Yes Hospitalized since last visit: No Patient Requires Transmission-Based Precautions: No Implantable device outside of the clinic excluding No Patient Has Alerts: No cellular tissue based products placed in the center since last visit: Has Dressing in Place as Prescribed: Yes Pain Present Now: Yes Electronic Signature(s) Signed: 09/22/2020 5:34:45 PM By: [name] Entered By: [name] on 09/22/2020 11:48:26 -------------------------------------------------------------------------------- Encounter Discharge Information Details Patient Name: Date of Service: [name], [name] 09/22/2020 11:00 A M Medical Record Number: [id number] Patient Account Number: [id number] Date of Birth/Sex: Treating RN: [birth year] (54 y.o. Female) Yevonne Pax Primary Care Machael Raine: Elizabeth Palau Other Clinician: Referring Dhillon Comun[place]: Treating Lacoya Wilbanks/Extender: Doristine Counter in Treatment: 1 Encounter Discharge Information Items Post Procedure Vitals Discharge Condition: Stable Temperature  (F): 98.4 Ambulatory Status: Wheelchair Pulse (bpm): 79 Discharge Destination: Home Respiratory Rate (breaths/min): 16 Transportation: Private Auto Blood Pressure (mmHg): 106/68 Accompanied By: father Schedule Follow-up Appointment: Yes Clinical Summary of Care: Patient Declined Electronic Signature(s) Signed: 09/22/2020 4:46:53 PM By: Yevonne Pax RN Entered By: Yevonne Pax on 09/22/2020 13:14:18 -------------------------------------------------------------------------------- Lower Extremity Assessment Details Patient Name: Date of Service: [name], [name] 09/22/2020 11:00 A M Medical Record Number: [id number] Patient Account Number: [id number] Date of Birth/Sex: Treating RN: 03/29/1966 (54 y.o. Female) [name] Primary Care Jarrett Chicoine: Elizabeth Palau Other Clinician: Referring Irene Mitcham: Treating Eulogia Dismore/Extender: Maryan Char [name]: 1 Edema Assessment Assessed: [Left: No] [Right: No] Edema: [Left: No] [Right: No] Calf Left: Right: Point of Measurement: 30 cm From Medial Instep 39 cm 39 cm Ankle Left: Right: Point of Measurement: 9 cm From Medial Instep 23 cm 25 cm Vascular Assessment Pulses: Dorsalis Pedis Palpable: [Left:Yes] [Right:Yes] Electronic Signature(s) Signed: 09/22/2020 5:34:45 PM By: [name] Entered By: [name] on 09/22/2020 11:49:29 -------------------------------------------------------------------------------- Multi-Disciplinary Care Plan Details Patient Name: Date of Service: [name], [name] 09/22/2020 11:00 A M Medical Record Number: [id number] Patient Account Number: [id number] Date of Birth/Sex: Treating RN: [birth year] (54 y.o. Female) Fonnie Mu Primary Care Brandye Inthavong: Elizabeth Palau Other Clinician: Referring Chrystina Naff: Treating Sahiti Joswick/Extender: Doristine Counter in Treatment: 1 Active Inactive Abuse / Safety / Falls / Self Care Management Nursing Diagnoses: Potential  for falls Goals: Patient/caregiver will verbalize/demonstrate measures taken to prevent injury and/or falls Date Initiated: 09/15/2020 Target Resolution Date: 10/13/2020 Goal Status: Active Interventions: Assess fall risk on admission and as needed Assess impairment of mobility on admission and as needed per policy Notes: Nutrition Nursing Diagnoses: Impaired glucose control: actual or potential Potential for alteratiion in Nutrition/Potential for imbalanced nutrition Goals: Patient/caregiver will maintain therapeutic glucose control Date Initiated: 09/15/2020 Target Resolution Date: 10/13/2020 Goal Status: Active Interventions: Assess HgA1c results as ordered upon admission and as needed Provide education on elevated blood sugars and impact on  wound healing Treatment Activities: Patient referred to Primary Care Physician for further nutritional evaluation : 09/15/2020 Notes: Wound/Skin Impairment Nursing Diagnoses: Impaired tissue integrity Knowledge deficit related to ulceration/compromised skin integrity Goals: Patient/caregiver will verbalize understanding of skin care regimen Date Initiated: 09/15/2020 Target Resolution Date: 10/13/2020 Goal Status: Active Ulcer/skin breakdown will have a volume reduction of 30% by week 4 Date Initiated: 09/15/2020 Target Resolution Date: 10/13/2020 Goal Status: Active Interventions: Assess patient/caregiver ability to obtain necessary supplies Assess patient/caregiver ability to perform ulcer/skin care regimen upon admission and as needed Assess ulceration(s) every visit Provide education on ulcer and skin care Treatment Activities: Skin care regimen initiated : 09/15/2020 Topical wound management initiated : 09/15/2020 Notes: Electronic Signature(s) Signed: 09/22/2020 11:59:54 AM By: Fonnie Mu RN Entered By: Fonnie Mu on 09/22/2020  11:59:53 -------------------------------------------------------------------------------- Pain Assessment Details Patient Name: Date of Service: [name], [name] 09/22/2020 11:00 A M Medical Record Number: [id number] Patient Account Number: [id number] Date of Birth/Sex: Treating RN: 05/09/1966 (54 y.o. Female) [name] Primary Care Raynelle Fujikawa: Elizabeth Palau Other Clinician: Referring Lynzi Meulemans: Treating Javi Bollman/Extender: Maryan Char [name]: 1 Active Problems Location of Pain Severity and Description of Pain Patient Has Paino Yes Site Locations Pain Location: Pain Location: Generalized Pain, Pain in Ulcers Rate the pain. Current Pain Level: 5 Worst Pain Level: 9 Least Pain Level: 0 Tolerable Pain Level: 8 Character of Pain Describe the Pain: Aching, Burning Pain Management and Medication Current Pain Management: Medication: No Cold Application: No Rest: No Massage: No Activity: No T.E.N.S.: No Heat Application: No Leg drop or elevation: No Is the Current Pain Management Adequate: Adequate How does your wound impact your activities of daily livingo Sleep: No Bathing: No Appetite: No Relationship With Others: No Bladder Continence: No Emotions: No Bowel Continence: No Work: No Toileting: No Drive: No Dressing: No Hobbies: No Electronic Signature(s) Signed: 09/22/2020 5:34:45 PM By: [name] Entered By: [name] on 09/22/2020 11:49:03 -------------------------------------------------------------------------------- Patient/Caregiver Education Details Patient Name: Date of Service: [name], [name] 12/1/2021andnbsp11:00 A M Medical Record Number: [id number] Patient Account Number: [id number] Date of Birth/Gender: Treating RN: Feb 28, 1966 (54 y.o. Female) Fonnie Mu Primary Care Physician: Elizabeth Palau Other Clinician: Referring Physician: Treating Physician/Extender: Doristine Counter in  Treatment: 1 Education Assessment Education Provided To: Patient Education Topics Provided Elevated Blood Sugar/ Impact on Healing: Methods: Explain/Verbal Responses: Reinforcements needed Wound/Skin Impairment: Methods: Explain/Verbal Responses: Reinforcements needed Electronic Signature(s) Signed: 09/23/2020 5:27:06 PM By: Fonnie Mu RN Entered By: Fonnie Mu on 09/22/2020 12:00:19 -------------------------------------------------------------------------------- Wound Assessment Details Patient Name: Date of Service: [name], [name] 09/22/2020 11:00 A M Medical Record Number: [id number] Patient Account Number: [id number] Date of Birth/Sex: Treating RN: 04-Jul-1966 (54 y.o. Female) [name] Primary Care Ledora Delker: Elizabeth Palau Other Clinician: Referring Jaelen Gellerman: Treating Meyer Arora/Extender: Maryan Char [name]: 1 Wound Status Wound Number: 1 Primary Diabetic Wound/Ulcer of the Lower Extremity Etiology: Wound Location: Right, Lateral Foot Wound Open Wounding Event: Shear/Friction Status: Date Acquired: 08/23/2020 Comorbid Anemia, Sleep Apnea, Hypotension, Type II Diabetes, Weeks Of Treatment: 1 History: Osteoarthritis, Neuropathy, Received Chemotherapy Clustered Wound: No Wound Measurements Length: (cm) 1.6 Width: (cm) 0.8 Depth: (cm) 0.1 Area: (cm) 1.005 Volume: (cm) 0.101 % Reduction in Area: 74.4% % Reduction in Volume: 74.3% Epithelialization: Medium (34-66%) Tunneling: No Undermining: No Wound Description Classification: Unable to visualize wound bed Wound Margin: Flat and Intact Exudate Amount: Medium Exudate Type: Serosanguineous Exudate Color: red, brown Foul Odor After Cleansing: No Slough/Fibrino Yes Wound Bed  Granulation Amount: Large (67-100%) Exposed Structure Granulation Quality: Red Fascia Exposed: No Necrotic Amount: Small (1-33%) Fat Layer (Subcutaneous Tissue) Exposed: Yes Necrotic Quality:  Adherent Slough Tendon Exposed: No Muscle Exposed: No Joint Exposed: No Bone Exposed: No Treatment Notes Wound #1 (Right, Lateral Foot) 1. Cleanse With Wound Cleanser 3. Primary Dressing Applied Santyl 4. Secondary Dressing Dry Gauze Roll Gauze 5. Secured With Secretary/administratorTape Electronic Signature(s) Signed: 09/22/2020 5:34:45 PM By: Shawn Stalleaton, Bobbi Entered By: Shawn Stalleaton, Bobbi on 09/22/2020 11:51:19 -------------------------------------------------------------------------------- Wound Assessment Details Patient Name: Date of Service: [name], [name] 09/22/2020 11:00 A M Medical Record Number: [id number] Patient Account Number: [id number] Date of Birth/Sex: Treating RN: [birth year] (54 y.o. Female) Shawn Stalleaton, Bobbi Primary Care Deonta Bomberger: Elizabeth PalauAnderson, [name] Other Clinician: Referring Jesilyn Easom: Treating Aswad Wandrey/Extender: Maryan CharStone III, Hoyt Ollis, Justin [name]: 1 Wound Status Wound Number: 2 Primary Diabetic Wound/Ulcer of the Lower Extremity Etiology: Wound Location: Right, Medial T Great oe Wound Open Wounding Event: Shear/Friction Status: Date Acquired: 08/23/2020 Comorbid Anemia, Sleep Apnea, Hypotension, Type II Diabetes, Weeks Of Treatment: 1 History: Osteoarthritis, Neuropathy, Received Chemotherapy Clustered Wound: No Wound Measurements Length: (cm) 2.2 Width: (cm) 1.7 Depth: (cm) 0.1 Area: (cm) 2.937 Volume: (cm) 0.294 % Reduction in Area: 4.4% % Reduction in Volume: 4.2% Epithelialization: Small (1-33%) Tunneling: No Undermining: No Wound Description Classification: Grade 2 Wound Margin: Flat and Intact Exudate Amount: Medium Exudate Type: Serosanguineous Exudate Color: red, brown Foul Odor After Cleansing: No Slough/Fibrino Yes Wound Bed Granulation Amount: Large (67-100%) Exposed Structure Granulation Quality: Pink, Pale Fascia Exposed: No Necrotic Amount: Small (1-33%) Fat Layer (Subcutaneous Tissue) Exposed: Yes Necrotic Quality: Adherent Slough Tendon  Exposed: No Muscle Exposed: No Joint Exposed: No Bone Exposed: No Treatment Notes Wound #2 (Right, Medial Toe Great) 1. Cleanse With Wound Cleanser 3. Primary Dressing Applied Calcium Alginate Ag 4. Secondary Dressing Dry Gauze 5. Secured With Tape Notes [employer] officernetting Electronic Signature(s) Signed: 09/22/2020 5:34:45 PM By: Shawn Stalleaton, Bobbi Entered By: Shawn Stalleaton, Bobbi on 09/22/2020 11:51:43 -------------------------------------------------------------------------------- Wound Assessment Details Patient Name: Date of Service: [name], [name] 09/22/2020 11:00 A M Medical Record Number: [id number] Patient Account Number: [id number] Date of Birth/Sex: Treating RN: [birth year] (54 y.o. Female) Shawn Stalleaton, Bobbi Primary Care Calleigh Lafontant: Elizabeth PalauAnderson, [name] Other Clinician: Referring Everest Brod: Treating Soul Hackman/Extender: Maryan CharStone III, Hoyt Ollis, Justin [name]: 1 Wound Status Wound Number: 3 Primary Diabetic Wound/Ulcer of the Lower Extremity Etiology: Wound Location: Right T Great oe Wound Open Wounding Event: Trauma Status: Date Acquired: 10/23/2017 Comorbid Anemia, Sleep Apnea, Hypotension, Type II Diabetes, Weeks Of Treatment: 1 History: Osteoarthritis, Neuropathy, Received Chemotherapy Clustered Wound: No Wound Measurements Length: (cm) 2.4 Width: (cm) 3.4 Depth: (cm) 0.3 Area: (cm) 6.409 Volume: (cm) 1.923 % Reduction in Area: 0% % Reduction in Volume: 0% Epithelialization: None Tunneling: No Wound Description Classification: Grade 2 Wound Margin: Epibole Exudate Amount: Medium Exudate Type: Serosanguineous Exudate Color: red, brown Foul Odor After Cleansing: No Slough/Fibrino Yes Wound Bed Granulation Amount: Large (67-100%) Exposed Structure Granulation Quality: Pink Fascia Exposed: No Necrotic Amount: Small (1-33%) Fat Layer (Subcutaneous Tissue) Exposed: Yes Necrotic Quality: Adherent Slough Tendon Exposed: No Muscle Exposed: No Joint Exposed: No Bone  Exposed: No Treatment Notes Wound #3 (Right Toe Great) 1. Cleanse With Wound Cleanser 3. Primary Dressing Applied Calcium Alginate Ag 4. Secondary Dressing Dry Gauze 5. Secured With Tape Notes [employer] officernetting Electronic Signature(s) Signed: 09/22/2020 5:34:45 PM By: Shawn Stalleaton, Bobbi Entered By: Shawn Stalleaton, Bobbi on 09/22/2020 11:52:15 -------------------------------------------------------------------------------- Wound Assessment Details Patient Name: Date of Service: [name], [name] 09/22/2020 11:00 A M Medical  Record Number: [id number] Patient Account Number: [id number] Date of Birth/Sex: Treating RN: 01/29/1966 (54 y.o. Female) [name] Primary Care Nickey Canedo: Elizabeth Palau Other Clinician: Referring Joory Gough: Treating Kailand Seda/Extender: Maryan Char [name]: 1 Wound Status Wound Number: 4 Primary Diabetic Wound/Ulcer of the Lower Extremity Etiology: Wound Location: Left, Lateral Foot Wound Open Wounding Event: Shear/Friction Status: Date Acquired: 08/23/2020 Comorbid Anemia, Sleep Apnea, Hypotension, Type II Diabetes, Weeks Of Treatment: 1 History: Osteoarthritis, Neuropathy, Received Chemotherapy Clustered Wound: No Wound Measurements Length: (cm) 3 Width: (cm) 2.5 Depth: (cm) 0.1 Area: (cm) 5.89 Volume: (cm) 0.589 % Reduction in Area: -25% % Reduction in Volume: -25.1% Epithelialization: Small (1-33%) Tunneling: No Undermining: No Wound Description Classification: Grade 2 Wound Margin: Flat and Intact Exudate Amount: Medium Exudate Type: Serosanguineous Exudate Color: red, brown Foul Odor After Cleansing: No Slough/Fibrino Yes Wound Bed Granulation Amount: Small (1-33%) Exposed Structure Granulation Quality: Pink Fascia Exposed: No Necrotic Amount: Large (67-100%) Fat Layer (Subcutaneous Tissue) Exposed: No Necrotic Quality: Adherent Slough Tendon Exposed: No Muscle Exposed: No Joint Exposed: No Bone Exposed: No Treatment  Notes Wound #4 (Left, Lateral Foot) 1. Cleanse With Wound Cleanser 3. Primary Dressing Applied Santyl 4. Secondary Dressing Dry Gauze Roll Gauze 5. Secured With Secretary/administrator) Signed: 09/22/2020 5:34:45 PM By: [name] Entered By: [name] on 09/22/2020 11:49:55 -------------------------------------------------------------------------------- Wound Assessment Details Patient Name: Date of Service: [name], [name] 09/22/2020 11:00 A M Medical Record Number: [id number] Patient Account Number: [id number] Date of Birth/Sex: Treating RN: 1966-01-24 (54 y.o. Female) [name] Primary Care Shelby Anderle: Elizabeth Palau Other Clinician: Referring Antoria Lanza: Treating Christiona Siddique/Extender: Maryan Char [name]: 1 Wound Status Wound Number: 5 Primary Lesion Etiology: Wound Location: Right, Lateral Lower Leg Wound Open Wounding Event: Gradually Appeared Status: Date Acquired: 08/23/2020 Comorbid Anemia, Sleep Apnea, Hypotension, Type II Diabetes, Weeks Of Treatment: 1 History: Osteoarthritis, Neuropathy, Received Chemotherapy Clustered Wound: No Wound Measurements Length: (cm) 0.2 Width: (cm) 0.2 Depth: (cm) 0.1 Area: (cm) 0.031 Volume: (cm) 0.003 % Reduction in Area: 86.9% % Reduction in Volume: 87.5% Epithelialization: Large (67-100%) Tunneling: No Undermining: No Wound Description Classification: Full Thickness Without Exposed Support Structures Wound Margin: Flat and Intact Exudate Amount: Medium Exudate Type: Serosanguineous Exudate Color: red, brown Foul Odor After Cleansing: No Slough/Fibrino No Wound Bed Granulation Amount: Large (67-100%) Exposed Structure Granulation Quality: Red, Pink Fascia Exposed: No Necrotic Amount: None Present (0%) Fat Layer (Subcutaneous Tissue) Exposed: Yes Tendon Exposed: No Muscle Exposed: No Joint Exposed: No Bone Exposed: No Treatment Notes Wound #5 (Right, Lateral Lower  Leg) Notes bactroban ointment as directed by PA . Electronic Signature(s) Signed: 09/22/2020 5:34:45 PM By: [name] Entered By: [name] on 09/22/2020 11:53:05 -------------------------------------------------------------------------------- Vitals Details Patient Name: Date of Service: [name], [name] 09/22/2020 11:00 A M Medical Record Number: [id number] Patient Account Number: [id number] Date of Birth/Sex: Treating RN: March 26, 1966 (54 y.o. Female) [name] Primary Care Reagen Goates: Elizabeth Palau Other Clinician: Referring Ford Peddie: Treating Theron Cumbie/Extender: Maryan Char [name]: 1 Vital Signs Time Taken: 11:30 Temperature (F): 98.4 Height (in): 66 Pulse (bpm): 79 Weight (lbs): 195 Respiratory Rate (breaths/min): 16 Body Mass Index (BMI): 31.5 Blood Pressure (mmHg): 106/68 Reference Range: 80 - 120 mg / dl Electronic Signature(s) Signed: 09/22/2020 5:34:45 PM By: [name] Entered By: [name] on 09/22/2020 11:48:42

## 2020-09-29 ENCOUNTER — Other Ambulatory Visit: Payer: Self-pay

## 2020-09-29 ENCOUNTER — Encounter (HOSPITAL_BASED_OUTPATIENT_CLINIC_OR_DEPARTMENT_OTHER): Payer: BC Managed Care – PPO | Admitting: Physician Assistant

## 2020-09-29 DIAGNOSIS — E11621 Type 2 diabetes mellitus with foot ulcer: Secondary | ICD-10-CM | POA: Diagnosis not present

## 2020-09-29 NOTE — Progress Notes (Signed)
Stacey, [name] ([id number]) Visit Report for 09/29/2020 Chief Complaint Document Details Patient Name: Date of Service: [name], [name] 09/29/2020 8:00 A M Medical Record Number: [id number] Patient Account Number: [id number] Date of Birth/Sex: Treating RN: 02/14/1966 (54 y.o. [name] Primary Care Provider: Elizabeth Palau Other Clinician: Referring Provider: Treating Provider/Extender: Duanne Limerick in Treatment: 2 Information Obtained from: Patient Chief Complaint Bilateral LE Ulcers Electronic Signature(s) Signed: 09/29/2020 8:27:17 AM By: Lenda Kelp PA-C Entered By: Lenda Kelp on 09/29/2020 08:27:17 -------------------------------------------------------------------------------- HPI Details Patient Name: Date of Service: [name], Milyn 09/29/2020 8:00 A M Medical Record Number: [id number] Patient Account Number: [id number] Date of Birth/Sex: Treating RN: 1966/02/02 (54 y.o. [name] Primary Care Provider: Elizabeth Palau Other Clinician: Referring Provider: Treating Provider/Extender: Duanne Limerick in Treatment: 2 History of Present Illness HPI Description: 09/15/2020 upon evaluation today patient appears to be doing somewhat poorly in regard to the wounds on her feet. She has a wound on the right lateral foot, right medial great toe, right dorsal great toe, left lateral foot, and right lateral lower leg. Currently based on what we are seeing the patient states that the worst issue is her right great toe. She dates that she wishes Dr. Victorino Dike had actually gone ahead and performed amputation at this site as well when he did the other toes they have all healed well that is the right third toe, left great toe, and left third toe. All are completely healed and did excellent. Currently she has been transition into Cam boots for both feet previously she was using postop shoes but apparently this caused some rubbing  on the lateral aspects of both feet and at the medial aspect of her right great toe which unfortunately has caused her a lot of discomfort and issues. She has been given mupirocin and betamethasone for some of the small wounds that occur over her legs and arms frequently. 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 Otherwise she also does have diabetes mellitus type 2. Currently the patient is on methotrexate and also undergoes chemotherapy infusions every 6 months for the Wegener's granulomatosis currently her amputations were asked on August 12, 2020 that was with Dr. Victorino Dike. His physician assistant Jill Alexanders referred her to me today. 09/22/2020 upon evaluation today patient appears to be making good progress with the Santyl at this point. I am extremely pleased with where things stand today and I do believe him to be able to debride some of the left lateral foot wound which we have not been able to do as the eschar was too thick. I think this is doing much better currently. 09/29/2020 upon evaluation today patient appears to be doing decently well in regard to her ulcers on her feet. I do believe the Santyl has been of benefit. With that being said I am concerned about the fact that she continues to have wounds over her arms and legs bilaterally which seem to continually be an issue. Fortunately there is no signs of active infection at this time but again I am not really sure where these are coming from. She is seeing dermatology they had suggested this was pyoderma but that seems kind of unusual based on how they seem to come up. She has been using a mix of steroid and mupirocin over these areas. Electronic Signature(s) Signed: 09/29/2020 9:17:11 AM By:  Linwood Dibbles, Nataliee Shurtz PA-C Entered By: Lenda Kelp on 09/29/2020  09:17:11 -------------------------------------------------------------------------------- Physical Exam Details Patient Name: Date of Service: [name], Stacey 09/29/2020 8:00 A M Medical Record Number: [id number] Patient Account Number: [id number] Date of Birth/Sex: Treating RN: 09-03-1966 (54 y.o. [name] Primary Care Provider: Elizabeth Palau Other Clinician: Referring Provider: Treating Provider/Extender: Duanne Limerick in Treatment: 2 Constitutional Well-nourished and well-hydrated in no acute distress. Respiratory normal breathing without difficulty. Psychiatric this patient is able to make decisions and demonstrates good insight into disease process. Alert and Oriented x 3. pleasant and cooperative. Notes On inspection patient's wounds currently showed signs of good granulation epithelization there was some slough noted but fortunately nothing too significant at this point. Fortunately she seems to be making decent progress. Electronic Signature(s) Signed: 09/29/2020 9:17:34 AM By: Lenda Kelp PA-C Entered By: Lenda Kelp on 09/29/2020 09:17:34 -------------------------------------------------------------------------------- Physician Orders Details Patient Name: Date of Service: Cheryl [name], Stacey 09/29/2020 8:00 A M Medical Record Number: [id number] Patient Account Number: [id number] Date of Birth/Sex: Treating RN: Jul 11, 1966 (54 y.o. [name], [name] Primary Care Provider: Elizabeth Palau Other Clinician: Referring Provider: Treating Provider/Extender: Duanne Limerick in Treatment: 2 Verbal / Phone Orders: No Diagnosis Coding ICD-10 Coding Code Description E11.621 Type 2 diabetes mellitus with foot ulcer L97.522 Non-pressure chronic ulcer of other part of left foot with fat layer exposed L97.512 Non-pressure chronic ulcer of other part of right foot with fat layer exposed L97.812 Non-pressure chronic ulcer of  other part of right lower leg with fat layer exposed M31.31 Wegener's granulomatosis with renal involvement N18.9 Chronic kidney disease, unspecified Follow-up Appointments Return Appointment in 2 weeks. Bathing/ Shower/ Hygiene May shower and wash wound with soap and water. - shower with hibiclens soap weekly Off-Loading Removable cast walker boot to: - Cam boot both feet to ambulate Wound Treatment Wound #1 - Foot Wound Laterality: Right, Lateral Cleanser: Soap and Water 1 x Per Day/30 Days Discharge Instructions: May shower and wash wound with dial antibacterial soap and water prior to dressing change. Prim Dressing: Santyl Ointment 1 x Per Day/30 Days ary Discharge Instructions: Apply nickel thick amount to wound bed as instructed Secondary Dressing: Woven Gauze Sponge, Non-Sterile 4x4 in 1 x Per Day/30 Days Discharge Instructions: Apply over primary dressing as directed. Secured With: [employer], 4.5x3.1 (in/yd) 1 x Per Day/30 Days Discharge Instructions: Secure with Kerlix as directed. Wound #2 - T Great oe Wound Laterality: Right, Medial Cleanser: Soap and Water Discharge Instructions: May shower and wash wound with dial antibacterial soap and water prior to dressing change. Prim Dressing: KerraCel Ag Gelling Fiber Dressing, 2x2 in (silver alginate) ary Discharge Instructions: Apply silver alginate to wound bed as instructed Secondary Dressing: Woven Gauze Sponges 2x2 in Discharge Instructions: Apply over primary dressing as directed. Secured With: Insurance underwriter, Sterile 2x75 (in/in) Discharge Instructions: Secure with stretch gauze as directed. Wound #3 - T Great oe Wound Laterality: Right Cleanser: Soap and Water Discharge Instructions: May shower and wash wound with dial antibacterial soap and water prior to dressing change. Prim Dressing: KerraCel Ag Gelling Fiber Dressing, 2x2 in (silver alginate) ary Discharge Instructions: Apply silver  alginate to wound bed as instructed Secondary Dressing: Woven Gauze Sponges 2x2 in Discharge Instructions: Apply over primary dressing as directed. Secured With: Insurance underwriter, Sterile 2x75 (in/in) Discharge Instructions: Secure with stretch gauze as directed. Wound #4 - Foot Wound Laterality: Left, Lateral Cleanser: Soap  and Water 1 x Per Day/30 Days Discharge Instructions: May shower and wash wound with dial antibacterial soap and water prior to dressing change. Prim Dressing: Santyl Ointment 1 x Per Day/30 Days ary Discharge Instructions: Apply nickel thick amount to wound bed as instructed Secondary Dressing: Woven Gauze Sponge, Non-Sterile 4x4 in 1 x Per Day/30 Days Discharge Instructions: Apply over primary dressing as directed. Secured With: [employer], 4.5x3.1 (in/yd) 1 x Per Day/30 Days Discharge Instructions: Secure with Kerlix as directed. Wound #5 - Lower Leg Wound Laterality: Right, Lateral Cleanser: Soap and Water 1 x Per Day/30 Days Discharge Instructions: May shower and wash wound with dial antibacterial soap and water prior to dressing change. Topical: Mupirocin Ointment 1 x Per Day/30 Days Discharge Instructions: Apply Mupirocin (Bactroban) as instructed Topical: Clobetasol Cream 1 x Per Day/30 Days Discharge Instructions: mixed with mupirocin Wound #6 - Lower Leg Wound Laterality: Left, Lateral Cleanser: Soap and Water 1 x Per Day/30 Days Discharge Instructions: May shower and wash wound with dial antibacterial soap and water prior to dressing change. Topical: Mupirocin Ointment 1 x Per Day/30 Days Discharge Instructions: Apply Mupirocin (Bactroban) as instructed Topical: Clobetasol Cream 1 x Per Day/30 Days Discharge Instructions: mixed with mupirocin Wound #7 - Lower Leg Wound Laterality: Left, Proximal Cleanser: Soap and Water 1 x Per Day/30 Days Discharge Instructions: May shower and wash wound with dial antibacterial soap and water  prior to dressing change. Topical: Mupirocin Ointment 1 x Per Day/30 Days Discharge Instructions: Apply Mupirocin (Bactroban) as instructed Topical: Clobetasol Cream 1 x Per Day/30 Days Discharge Instructions: mixed with mupirocin Wound #8 - Forearm Wound Laterality: Right Cleanser: Soap and Water 1 x Per Day/30 Days Discharge Instructions: May shower and wash wound with dial antibacterial soap and water prior to dressing change. Topical: Mupirocin Ointment 1 x Per Day/30 Days Discharge Instructions: Apply Mupirocin (Bactroban) as instructed Topical: Clobetasol Cream 1 x Per Day/30 Days Discharge Instructions: mixed with mupirocin Secondary Dressing: ComfortFoam Border, 3x3 in (silicone border) (DME) (Generic) 1 x Per Day/30 Days Discharge Instructions: Apply over primary dressing as directed. Consults Dermatology - recurring atypical lesions to extremities [facility], [place] (if available) - (ICD10 M31.31 - Wegener's granulomatosis with renal involvement) Electronic Signature(s) Signed: 09/29/2020 4:01:55 PM By: Lenda Kelp PA-C Signed: 09/29/2020 6:12:31 PM By: Zenaida Deed RN, BSN Entered By: Zenaida Deed on 09/29/2020 13:43:43 Prescription 09/29/2020 -------------------------------------------------------------------------------- Skip Estimable PA Patient Name: Provider: 28-Feb-1966 [id number] Date of Birth: NPI#Sherral Hammers Sex: DEA #: [phone] Phone #: License #: Eligha Bridegroom [facility] Patient Address: [address] [name], [place] [address] [address] [place], [place] [address] [phone] Allergies ibuprofen; penicillin Provider's Orders Dermatology - ICD10: M31.31 - recurring atypical lesions to extremities - [facility] D[facility], [place] (if available) Hand Signature: Date(s): Electronic Signature(s) Signed: 09/29/2020 4:01:55 PM By: Lenda Kelp PA-C Signed: 09/29/2020 6:12:31 PM By:  Zenaida Deed RN, BSN Entered By: Zenaida Deed on 09/29/2020 13:43:45 -------------------------------------------------------------------------------- Problem List Details Patient Name: Date of Service: Cheryl [name], Venna 09/29/2020 8:00 A M Medical Record Number: [id number] Patient Account Number: [id number] Date of Birth/Sex: Treating RN: 1966/03/25 (54 y.o. [name] Primary Care Provider: Elizabeth Palau Other Clinician: Referring Provider: Treating Provider/Extender: Duanne Limerick in Treatment: 2 Active Problems ICD-10 Encounter Code Description Active Date MDM Diagnosis E11.621 Type 2 diabetes mellitus with foot ulcer 09/15/2020 No Yes L97.522 Non-pressure chronic ulcer of other part of  left foot with fat layer exposed 09/15/2020 No Yes L97.512 Non-pressure chronic ulcer of other part of right foot with fat layer exposed 09/15/2020 No Yes L97.812 Non-pressure chronic ulcer of other part of right lower leg with fat layer 09/15/2020 No Yes exposed M31.31 Wegener's granulomatosis with renal involvement 09/15/2020 No Yes N18.9 Chronic kidney disease, unspecified 09/15/2020 No Yes Inactive Problems Resolved Problems Electronic Signature(s) Signed: 09/29/2020 8:27:11 AM By: Lenda Kelp PA-C Entered By: Lenda Kelp on 09/29/2020 08:27:10 -------------------------------------------------------------------------------- Progress Note Details Patient Name: Date of Service: [name], Doaa 09/29/2020 8:00 A M Medical Record Number: [id number] Patient Account Number: [id number] Date of Birth/Sex: Treating RN: [birth year] (54 y.o. [name] Primary Care Provider: Elizabeth Palau Other Clinician: Referring Provider: Treating Provider/Extender: Duanne Limerick in Treatment: 2 Subjective Chief Complaint Information obtained from Patient Bilateral LE Ulcers History of Present Illness (HPI) 09/15/2020 upon evaluation  today patient appears to be doing somewhat poorly in regard to the wounds on her feet. She has a wound on the right lateral foot, right medial great toe, right dorsal great toe, left lateral foot, and right lateral lower leg. Currently based on what we are seeing the patient states that the worst issue is her right great toe. She dates that she wishes Dr. Victorino Dike had actually gone ahead and performed amputation at this site as well when he did the other toes they have all healed well that is the right third toe, left great toe, and left third toe. All are completely healed and did excellent. Currently she has been transition into Cam boots for both feet previously she was using postop shoes but apparently this caused some rubbing on the lateral aspects of both feet and at the medial aspect of her right great toe which unfortunately has caused her a lot of discomfort and issues. She has been given mupirocin and betamethasone for some of the small wounds that occur over her legs and arms frequently. 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 Otherwise she also does have diabetes mellitus type 2. Currently the patient is on methotrexate and also undergoes chemotherapy infusions every 6 months for the Wegener's granulomatosis currently her amputations were asked on August 12, 2020 that was with Dr. Victorino Dike. His physician assistant Jill Alexanders referred her to me today. 09/22/2020 upon evaluation today patient appears to be making good progress with the Santyl at this point. I am extremely pleased with where things stand today and I do believe him to be able to debride some of the left lateral foot wound which we have not been able to do as the eschar was too thick. I think this is doing much better currently. 09/29/2020 upon evaluation today patient appears to be doing decently well in regard to  her ulcers on her feet. I do believe the Santyl has been of benefit. With that being said I am concerned about the fact that she continues to have wounds over her arms and legs bilaterally which seem to continually be an issue. Fortunately there is no signs of active infection at this time but again I am not really sure where these are coming from. She is seeing dermatology they had suggested this was pyoderma but that seems kind of unusual based on how they seem to come up. She has been using a mix of steroid and mupirocin over these areas. Objective Constitutional  Well-nourished and well-hydrated in no acute distress. Vitals Time Taken: 8:04 AM, Height: 66 in, Weight: 195 lbs, BMI: 31.5, Temperature: 98.4 F, Pulse: 74 bpm, Respiratory Rate: 18 breaths/min, Blood Pressure: 95/64 mmHg. Respiratory normal breathing without difficulty. Psychiatric this patient is able to make decisions and demonstrates good insight into disease process. Alert and Oriented x 3. pleasant and cooperative. General Notes: On inspection patient's wounds currently showed signs of good granulation epithelization there was some slough noted but fortunately nothing too significant at this point. Fortunately she seems to be making decent progress. Integumentary (Hair, Skin) Wound #1 status is Open. Original cause of wound was Shear/Friction. The wound is located on the Right,Lateral Foot. The wound measures 1.7cm length x 0.9cm width x 0.1cm depth; 1.202cm^2 area and 0.12cm^3 volume. There is Fat Layer (Subcutaneous Tissue) exposed. There is no tunneling or undermining noted. There is a medium amount of serosanguineous drainage noted. The wound margin is flat and intact. There is large (67-100%) red granulation within the wound bed. There is a small (1-33%) amount of necrotic tissue within the wound bed including Adherent Slough. Wound #2 status is Open. Original cause of wound was Shear/Friction. The wound is located on  the Right,Medial T Great. The wound measures 2.1cm length oe x 2cm width x 0.1cm depth; 3.299cm^2 area and 0.33cm^3 volume. There is Fat Layer (Subcutaneous Tissue) exposed. There is no tunneling or undermining noted. There is a medium amount of serosanguineous drainage noted. The wound margin is flat and intact. There is large (67-100%) pink, pale granulation within the wound bed. There is a small (1-33%) amount of necrotic tissue within the wound bed including Adherent Slough. Wound #3 status is Open. Original cause of wound was Trauma. The wound is located on the Right T Great. The wound measures 3cm length x 3cm width x oe 0.3cm depth; 7.069cm^2 area and 2.121cm^3 volume. There is Fat Layer (Subcutaneous Tissue) exposed. There is no tunneling or undermining noted. There is a medium amount of serosanguineous drainage noted. The wound margin is epibole. There is large (67-100%) pink granulation within the wound bed. There is a small (1-33%) amount of necrotic tissue within the wound bed including Adherent Slough. Wound #4 status is Open. Original cause of wound was Shear/Friction. The wound is located on the Left,Lateral Foot. The wound measures 2.5cm length x 2.3cm width x 0.2cm depth; 4.516cm^2 area and 0.903cm^3 volume. There is no tunneling or undermining noted. There is a medium amount of serosanguineous drainage noted. The wound margin is flat and intact. There is small (1-33%) pink granulation within the wound bed. There is a large (67-100%) amount of necrotic tissue within the wound bed including Adherent Slough. Wound #5 status is Open. Original cause of wound was Gradually Appeared. The wound is located on the Right,Lateral Lower Leg. The wound measures 1.7cm length x 0.4cm width x 0.1cm depth; 0.534cm^2 area and 0.053cm^3 volume. There is Fat Layer (Subcutaneous Tissue) exposed. There is no tunneling or undermining noted. There is a medium amount of serosanguineous drainage noted. The  wound margin is flat and intact. There is large (67-100%) red, pink granulation within the wound bed. There is no necrotic tissue within the wound bed. Wound #6 status is Open. Original cause of wound was Gradually Appeared. The wound is located on the Left,Lateral Lower Leg. The wound measures 1.4cm length x 1cm width x 0.1cm depth; 1.1cm^2 area and 0.11cm^3 volume. There is no tunneling or undermining noted. There is a medium amount of serosanguineous drainage  noted. The wound margin is distinct with the outline attached to the wound base. There is large (67-100%) red, pink granulation within the wound bed. There is a small (1-33%) amount of necrotic tissue within the wound bed including Adherent Slough. Wound #7 status is Open. Original cause of wound was Gradually Appeared. The wound is located on the Left,Proximal Lower Leg. The wound measures 1cm length x 1cm width x 0.1cm depth; 0.785cm^2 area and 0.079cm^3 volume. There is no tunneling or undermining noted. There is a medium amount of serosanguineous drainage noted. The wound margin is distinct with the outline attached to the wound base. There is large (67-100%) red, pink granulation within the wound bed. There is a small (1-33%) amount of necrotic tissue within the wound bed including Adherent Slough. Wound #8 status is Open. Original cause of wound was Gradually Appeared. The wound is located on the Right Forearm. The wound measures 1cm length x 1.4cm width x 0.1cm depth; 1.1cm^2 area and 0.11cm^3 volume. There is no tunneling or undermining noted. There is a medium amount of serosanguineous drainage noted. The wound margin is distinct with the outline attached to the wound base. There is large (67-100%) red, pink granulation within the wound bed. There is a small (1-33%) amount of necrotic tissue within the wound bed including Adherent Slough. 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 Bathing/ Shower/ Hygiene: May shower and wash wound with soap and water. - shower with hibiclens soap weekly Off-Loading: Removable cast walker boot to: - Cam boot both feet to ambulate Consults ordered were: Dermatology - recurring atypical` lesions to extremities [facility] Dermatology, [place] (if available) WOUND #1: - Foot Wound Laterality: Right, Lateral Cleanser: Soap and Water 1 x Per Day/30 Days Discharge Instructions: May shower and wash wound with dial antibacterial soap and water prior to dressing change. Prim Dressing: Santyl Ointment 1 x Per Day/30 Days ary Discharge Instructions: Apply nickel thick amount to wound bed as instructed Secondary Dressing: Woven Gauze Sponge, Non-Sterile 4x4 in 1 x Per Day/30 Days Discharge Instructions: Apply over primary dressing as directed. Secured With: [employer], 4.5x3.1 (in/yd) 1 x Per Day/30 Days Discharge Instructions: Secure with Kerlix as directed. WOUND #2: - T Great Wound Laterality: Right, Medial oe Cleanser: Soap and Water Discharge Instructions: May shower and wash wound with dial antibacterial soap and water prior to dressing change. Prim Dressing: KerraCel Ag Gelling Fiber Dressing, 2x2 in (silver alginate) ary Discharge Instructions: Apply silver alginate to wound bed as instructed Secondary Dressing: Woven Gauze Sponges 2x2 in Discharge Instructions: Apply over primary dressing as directed. Secured With: Insurance underwriter, Sterile 2x75 (in/in) Discharge Instructions: Secure with stretch gauze as directed. WOUND #3: - T Great Wound  Laterality: Right oe Cleanser: Soap and Water Discharge Instructions: May shower and wash wound with dial antibacterial soap and water prior to dressing change. Prim Dressing: KerraCel Ag Gelling Fiber Dressing, 2x2 in (silver alginate) ary Discharge Instructions: Apply silver alginate to wound bed as instructed Secondary Dressing: Woven Gauze Sponges 2x2 in Discharge Instructions: Apply over primary dressing as directed. Secured With: Insurance underwriter, Sterile 2x75 (in/in) Discharge  Instructions: Secure with stretch gauze as directed. WOUND #4: - Foot Wound Laterality: Left, Lateral Cleanser: Soap and Water 1 x Per Day/30 Days Discharge Instructions: May shower and wash wound with dial antibacterial soap and water prior to dressing change. Prim Dressing: Santyl Ointment 1 x Per Day/30 Days ary Discharge Instructions: Apply nickel thick amount to wound bed as instructed Secondary Dressing: Woven Gauze Sponge, Non-Sterile 4x4 in 1 x Per Day/30 Days Discharge Instructions: Apply over primary dressing as directed. Secured With: [employer], 4.5x3.1 (in/yd) 1 x Per Day/30 Days Discharge Instructions: Secure with Kerlix as directed. WOUND #5: - Lower Leg Wound Laterality: Right, Lateral Cleanser: Soap and Water 1 x Per Day/30 Days Discharge Instructions: May shower and wash wound with dial antibacterial soap and water prior to dressing change. Topical: Mupirocin Ointment 1 x Per Day/30 Days Discharge Instructions: Apply Mupirocin (Bactroban) as instructed Topical: Clobetasol Cream 1 x Per Day/30 Days Discharge Instructions: mixed with mupirocin WOUND #6: - Lower Leg Wound Laterality: Left, Lateral Cleanser: Soap and Water 1 x Per Day/30 Days Discharge Instructions: May shower and wash wound with dial antibacterial soap and water prior to dressing change. Topical: Mupirocin Ointment 1 x Per Day/30 Days Discharge Instructions: Apply Mupirocin (Bactroban) as  instructed Topical: Clobetasol Cream 1 x Per Day/30 Days Discharge Instructions: mixed with mupirocin WOUND #7: - Lower Leg Wound Laterality: Left, Proximal Cleanser: Soap and Water 1 x Per Day/30 Days Discharge Instructions: May shower and wash wound with dial antibacterial soap and water prior to dressing change. Topical: Mupirocin Ointment 1 x Per Day/30 Days Discharge Instructions: Apply Mupirocin (Bactroban) as instructed Topical: Clobetasol Cream 1 x Per Day/30 Days Discharge Instructions: mixed with mupirocin WOUND #8: - Forearm Wound Laterality: Right Cleanser: Soap and Water 1 x Per Day/30 Days Discharge Instructions: May shower and wash wound with dial antibacterial soap and water prior to dressing change. Topical: Mupirocin Ointment 1 x Per Day/30 Days Discharge Instructions: Apply Mupirocin (Bactroban) as instructed Topical: Clobetasol Cream 1 x Per Day/30 Days Discharge Instructions: mixed with mupirocin 1. I would recommend currently that we continue with the wound care measures as before specifically with regard to the [facility] for her right and left lateral foot and right medial foot we are using alginate. For the right great toe we are also using alginate at this time. At the other locations I am just going to have her utilize her mix of Bactroban ointment and steroid cream which she seems to have done well with in general. 2. I would recommend the patient continue to cover the areas with roll gauze to secure in place as I feel like this is appropriate and will help her as well pad the areas on her feet. 3. She should continue to use the cam walker boots. 4. For the atypical wounds over her bilateral arms and lower extremities I would recommend that we have her see [facility] dermatology to see what they have to say in this regard. Really we do not have a firm diagnosis currently and I think we really need 1 to know where to go from here. We will see patient back for reevaluation in  1 week here in the clinic. If anything worsens or changes patient will contact our office for additional recommendations. Electronic Signature(s) Signed: 09/29/2020 9:19:26 AM By: Lenda Kelp PA-C Entered By: Lenda Kelp on 09/29/2020 09:19:26 -------------------------------------------------------------------------------- SuperBill Details Patient Name: Date of Service: CLOE, SOCKWELL 09/29/2020 Medical Record Number: [id number] Patient Account Number:  [id number] Date of Birth/Sex: Treating RN: [birth year] (54 y.o. 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 ulcer of other part of right lower leg with fat layer exposed M31.31 Wegener's granulomatosis with renal involvement N18.9 Chronic kidney disease, unspecified Facility Procedures CPT4 Code: [id number] Description: [phone] - WOUND CARE VISIT-LEV 5 EST PT Modifier: Quantity: 1 Physician Procedures : CPT4 Code Description Modifier [id number] - WC PHYS LEVEL 4 - EST PT ICD-10 Diagnosis Description E11.621 Type 2 diabetes mellitus with foot ulcer L97.522 Non-pressure chronic ulcer of other part of left foot with fat layer exposed L97.512  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

## 2020-09-30 NOTE — Progress Notes (Signed)
[name], [name] ([id number]) Visit Report for 09/29/2020 Arrival Information Details Patient Name: Date of Service: [name], [name] 09/29/2020 8:00 A M Medical Record Number: [id number] Patient Account Number: [id number] Date of Birth/Sex: Treating RN: 02/21/1966 (54 y.o. [name], [name] Primary Care [name]: [name] Other Clinician: Referring [name]: Treating [name]/Extender: [name] in Treatment: 2 Visit Information History Since Last Visit Added or deleted any medications: No Patient Arrived: Wheel Chair Any new allergies or adverse reactions: No Arrival Time: 08:02 Had a fall or experienced change in No Accompanied By: family activities of daily living that may affect Transfer Assistance: None risk of falls: Patient Identification Verified: Yes Signs or symptoms of abuse/neglect since last visito No Secondary Verification Process Completed: Yes Hospitalized since last visit: No Patient Requires Transmission-Based Precautions: No Implantable device outside of the clinic excluding No Patient Has Alerts: No cellular tissue based products placed in the center since last visit: Has Dressing in Place as Prescribed: Yes Pain Present Now: Yes Electronic Signature(s) Signed: 09/30/2020 3:27:39 PM By: Stacey Mu RN Entered By: [name] on 09/29/2020 08:04:10 -------------------------------------------------------------------------------- Clinic Level of Care Assessment Details Patient Name: Date of Service: [name], [name] 09/29/2020 8:00 A M Medical Record Number: [id number] Patient Account Number: [id number] Date of Birth/Sex: Treating RN: 12/11/1965 (54 y.o. [name] Primary Care [name]: [name] Other Clinician: Referring [name]: Treating [name]/Extender: [name] in Treatment: 2 Clinic Level of Care Assessment Items TOOL 4 Quantity Score []  - 0 Use when only an  EandM is performed on FOLLOW-UP visit ASSESSMENTS - Nursing Assessment / Reassessment X- 1 10 Reassessment of Co-morbidities (includes updates in patient status) X- 1 5 Reassessment of Adherence to Treatment Plan ASSESSMENTS - Wound and Skin A ssessment / Reassessment []  - 0 Simple Wound Assessment / Reassessment - one wound X- 8 5 Complex Wound Assessment / Reassessment - multiple wounds []  - 0 Dermatologic / Skin Assessment (not related to wound area) ASSESSMENTS - Focused Assessment X- 1 5 Circumferential Edema Measurements - multi extremities []  - 0 Nutritional Assessment / Counseling / Intervention X- 1 5 Lower Extremity Assessment (monofilament, tuning fork, pulses) []  - 0 Peripheral Arterial Disease Assessment (using hand held doppler) ASSESSMENTS - Ostomy and/or Continence Assessment and Care []  - 0 Incontinence Assessment and Management []  - 0 Ostomy Care Assessment and Management (repouching, etc.) PROCESS - Coordination of Care X - Simple Patient / Family Education for ongoing care 1 15 []  - 0 Complex (extensive) Patient / Family Education for ongoing care X- 1 10 Staff obtains , Records, T Results / Process Orders est []  - 0 Staff telephones HHA, Nursing Homes / Clarify orders / etc []  - 0 Routine Transfer to another Facility (non-emergent condition) []  - 0 Routine Hospital Admission (non-emergent condition) []  - 0 New Admissions / / Ordering NPWT Apligraf, etc. , []  - 0 Emergency Hospital Admission (emergent condition) X- 1 10 Simple Discharge Coordination []  - 0 Complex (extensive) Discharge Coordination PROCESS - Special Needs []  - 0 Pediatric / Minor Patient Management []  - 0 Isolation Patient Management []  - 0 Hearing / Language / Visual special needs []  - 0 Assessment of Community assistance (transportation, D/C planning, etc.) []  - 0 Additional assistance / Altered mentation []  - 0 Support Surface(s)  Assessment (bed, cushion, seat, etc.) INTERVENTIONS - Wound Cleansing / Measurement []  - 0 Simple Wound Cleansing - one wound X- 8 5 Complex Wound Cleansing - multiple wounds X-  1 5 Wound Imaging (photographs - any number of wounds)  - 0 Wound Tracing (instead of photographs)  - 0 Simple Wound Measurement - one wound X- 8 5 Complex Wound Measurement - multiple wounds INTERVENTIONS - Wound Dressings X - Small Wound Dressing one or multiple wounds 8 10  - 0 Medium Wound Dressing one or multiple wounds  - 0 Large Wound Dressing one or multiple wounds X- 1 5 Application of Medications - topical  - 0 Application of Medications - injection INTERVENTIONS - Miscellaneous  - 0 External ear exam  - 0 Specimen Collection (cultures, biopsies, blood, body fluids, etc.)  - 0 Specimen(s) / Culture(s) sent or taken to Lab for analysis  - 0 Patient Transfer (multiple staff / Nurse, adult / Similar devices)  - 0 Simple Staple / Suture removal (25 or less)  - 0 Complex Staple / Suture removal (26 or more)  - 0 Hypo / Hyperglycemic Management (close monitor of Blood Glucose)  - 0 Ankle / Brachial Index (ABI) - do not check if billed separately X- 1 5 Vital Signs Has the patient been seen at the hospital within the last three years: Yes Total Score: 275 Level Of Care: New/Established - Level 5 Electronic Signature(s) Signed: 09/29/2020 6:12:31 PM By: Stacey Deed RN, BSN Entered By: [name] on 09/29/2020 09:00:38 -------------------------------------------------------------------------------- Encounter Discharge Information Details Patient Name: Date of Service: [name], [name] 09/29/2020 8:00 A M Medical Record Number: [id number] Patient Account Number: [id number] Date of Birth/Sex: Treating RN: 1966/07/08 (54 y.o. [name], [name] Primary Care Lamonta Cypress: [name] Other Clinician: Referring Tyteanna Ost: Treating [name]/Extender: [name] in Treatment: 2 Encounter Discharge Information Items Discharge Condition: Stable Ambulatory Status: Wheelchair Discharge Destination: Home Transportation: Private Auto Accompanied By: father Schedule Follow-up Appointment: Yes Clinical Summary of Care: Electronic Signature(s) Signed: 09/29/2020 5:17:43 PM By: [name] Entered By: [name] on 09/29/2020 10:41:09 -------------------------------------------------------------------------------- Lower Extremity Assessment Details Patient Name: Date of Service: [name], [name] 09/29/2020 8:00 A M Medical Record Number: [id number] Patient Account Number: [id number] Date of Birth/Sex: Treating RN: 1966-01-10 (54 y.o. [name], [name] Primary Care Keon Pender: [name] Other Clinician: Referring Westley Blass: Treating Yesenia Fontenette/Extender: [name] in Treatment: 2 Edema Assessment Assessed: [Left: No] [Right: No] Edema: [Left: No] [Right: No] Calf Left: Right: Point of Measurement: 30 cm From Medial Instep 37 cm 38 cm Ankle Left: Right: Point of Measurement: 9 cm From Medial Instep 22 cm 21.5 cm Vascular Assessment Pulses: Dorsalis Pedis Palpable: [Left:Yes] [Right:Yes] Posterior Tibial Palpable: [Left:Yes] [Right:Yes] Electronic Signature(s) Signed: 09/30/2020 3:27:39 PM By: Stacey Mu RN Entered By: [name] on 09/29/2020 08:09:05 -------------------------------------------------------------------------------- Multi-Disciplinary Care Plan Details Patient Name: Date of Service: [name], [name] 09/29/2020 8:00 A M Medical Record Number: [id number] Patient Account Number: [id number] Date of Birth/Sex: Treating RN: 23-Jun-1966 (54 y.o. [name] Primary Care Oswald Pott: [name] Other Clinician: Referring Criselda Starke: Treating Krystine Pabst/Extender: [name] in Treatment: 2 Active Inactive Abuse / Safety /  Falls / Self Care Management Nursing Diagnoses: Potential for falls Goals: Patient/caregiver will verbalize/demonstrate measures taken to prevent injury and/or falls Date Initiated: 09/15/2020 Target Resolution Date: 10/13/2020 Goal Status: Active Interventions: Assess fall risk on admission and as needed Assess impairment of mobility on admission and as needed per policy Notes: Nutrition Nursing Diagnoses: Impaired glucose control: actual or potential Potential for alteratiion in Nutrition/Potential for imbalanced nutrition Goals: Patient/caregiver will maintain therapeutic glucose control Date Initiated: 09/15/2020 Target  Resolution Date: 10/13/2020 Goal Status: Active Interventions: Assess HgA1c results as ordered upon admission and as needed Provide education on elevated blood sugars and impact on wound healing Treatment Activities: Patient referred to Primary Care Physician for further nutritional evaluation : 09/15/2020 Notes: Wound/Skin Impairment Nursing Diagnoses: Impaired tissue integrity Knowledge deficit related to ulceration/compromised skin integrity Goals: Patient/caregiver will verbalize understanding of skin care regimen Date Initiated: 09/15/2020 Target Resolution Date: 10/13/2020 Goal Status: Active Ulcer/skin breakdown will have a volume reduction of 30% by week 4 Date Initiated: 09/15/2020 Target Resolution Date: 10/13/2020 Goal Status: Active Interventions: Assess patient/caregiver ability to obtain necessary supplies Assess patient/caregiver ability to perform ulcer/skin care regimen upon admission and as needed Assess ulceration(s) every visit Provide education on ulcer and skin care Treatment Activities: Skin care regimen initiated : 09/15/2020 Topical wound management initiated : 09/15/2020 Notes: Electronic Signature(s) Signed: 09/29/2020 6:12:31 PM By: Stacey Deed RN, BSN Entered By: [name] on 09/29/2020  08:04:36 -------------------------------------------------------------------------------- Pain Assessment Details Patient Name: Date of Service: [name], [name] 09/29/2020 8:00 A M Medical Record Number: [id number] Patient Account Number: [id number] Date of Birth/Sex: Treating RN: [birth year] (54 y.o. [name], [name] Primary Care Harvie Morua: [name] Other Clinician: Referring Elliette Seabolt: Treating Davis Ambrosini/Extender: [name] in Treatment: 2 Active Problems Location of Pain Severity and Description of Pain Patient Has Paino Yes Site Locations Pain Location: Pain in Ulcers With Dressing Change: No Duration of the Pain. Constant / Intermittento Constant Rate the pain. Current Pain Level: 7 Worst Pain Level: 10 Least Pain Level: 0 Tolerable Pain Level: 7 Character of Pain Describe the Pain: Aching Pain Management and Medication Current Pain Management: Medication: Yes Cold Application: No Rest: Yes Massage: No Activity: No T.E.N.S.: No Heat Application: No Leg drop or elevation: No Is the Current Pain Management Adequate: Adequate How does your wound impact your activities of daily livingo Sleep: No Bathing: No Appetite: No Relationship With Others: No Bladder Continence: No Emotions: No Bowel Continence: No Work: No Toileting: No Drive: No Dressing: No Hobbies: No Electronic Signature(s) Signed: 09/30/2020 3:27:39 PM By: Stacey Mu RN Entered By: [name] on 09/29/2020 08:05:12 -------------------------------------------------------------------------------- Patient/Caregiver Education Details Patient Name: Date of Service: [name], [name] 12/8/2021andnbsp8:00 A M Medical Record Number: [id number] Patient Account Number: [id number] Date of Birth/Gender: Treating RN: [birth year] (54 y.o. [name] Primary Care Physician: [name] Other Clinician: Referring Physician: Treating  Physician/Extender: [name] in Treatment: 2 Education Assessment Education Provided To: Patient Education Topics Provided Elevated Blood Sugar/ Impact on Healing: Methods: Explain/Verbal Responses: Reinforcements needed, State content correctly Offloading: Methods: Explain/Verbal Responses: Reinforcements needed, State content correctly Wound/Skin Impairment: Methods: Explain/Verbal Responses: Reinforcements needed, State content correctly Electronic Signature(s) Signed: 09/29/2020 6:12:31 PM By: Stacey Deed RN, BSN Entered By: [name] on 09/29/2020 08:05:06 -------------------------------------------------------------------------------- Wound Assessment Details Patient Name: Date of Service: [name], [name] 09/29/2020 8:00 A M Medical Record Number: [id number] Patient Account Number: [id number] Date of Birth/Sex: Treating RN: [birth year] (54 y.o. [name], [name] Primary Care Ellamay Fors: [name] Other Clinician: Referring Lymon Kidney: Treating Norina Cowper/Extender: [name] in Treatment: 2 Wound Status Wound Number: 1 Primary Diabetic Wound/Ulcer of the Lower Extremity Etiology: Wound Location: Right, Lateral Foot Wound Open Wounding Event: Shear/Friction Status: Status: Date Acquired: 08/23/2020 Comorbid Anemia, Sleep Apnea, Hypotension, Type II Diabetes, Weeks Of Treatment: 2 History: Osteoarthritis, Neuropathy, Received Chemotherapy Clustered Wound: No Photos Photo Uploaded By: Benjaman Kindler on 09/30/2020 13:36:30 Wound Measurements Length: (cm) 1.7  Width: (cm) 0.9 Depth: (cm) 0.1 Area: (cm) 1.202 Volume: (cm) 0.12 % Reduction in Area: 69.4% % Reduction in Volume: 69.5% Epithelialization: Medium (34-66%) Tunneling: No Undermining: No Wound Description Classification: Unable to visualize wound bed Wound Margin: Flat and Intact Exudate Amount: Medium Exudate Type:  Serosanguineous Exudate Color: red, brown Foul Odor After Cleansing: No Slough/Fibrino Yes Wound Bed Granulation Amount: Large (67-100%) Exposed Structure Granulation Quality: Red Fascia Exposed: No Necrotic Amount: Small (1-33%) Fat Layer (Subcutaneous Tissue) Exposed: Yes Necrotic Quality: Adherent Slough Tendon Exposed: No Muscle Exposed: No Joint Exposed: No Bone Exposed: No Treatment Notes Wound #1 (Foot) Wound Laterality: Right, Lateral Cleanser Soap and Water Discharge Instruction: May shower and wash wound with dial antibacterial soap and water prior to dressing change. Peri-Wound Care Topical Primary Dressing Santyl Ointment Discharge Instruction: Apply nickel thick amount to wound bed as instructed Secondary Dressing Woven Gauze Sponge, Non-Sterile 4x4 in Discharge Instruction: Apply over primary dressing as directed. Secured With [employer], 4.5x3.1 (in/yd) Discharge Instruction: Secure with Kerlix as directed. Compression Wrap Compression Stockings Add-Ons Electronic Signature(s) Signed: 09/29/2020 8:32:13 AM By: Stacey Mu RN Entered By: [name] on 09/29/2020 08:32:13 -------------------------------------------------------------------------------- Wound Assessment Details Patient Name: Date of Service: [name], [name] 09/29/2020 8:00 A M Medical Record Number: [id number] Patient Account Number: [id number] Date of Birth/Sex: Treating RN: May 03, 1966 (54 y.o. [name], [name] Primary Care Keyandra Swenson: [name] Other Clinician: Referring Ramin Zoll: Treating Madisson Kulaga/Extender: [name] in Treatment: 2 Wound Status Wound Number: 2 Primary Diabetic Wound/Ulcer of the Lower Extremity Etiology: Wound Location: Right, Medial T Great oe Wound Open Wounding Event: Shear/Friction Status: Date Acquired: 08/23/2020 Comorbid Anemia, Sleep Apnea, Hypotension, Type II Diabetes, Weeks Of Treatment:  2 History: Osteoarthritis, Neuropathy, Received Chemotherapy Clustered Wound: No Photos Photo Uploaded By: Benjaman Kindler on 09/30/2020 13:34:50 Wound Measurements Length: (cm) 2.1 Width: (cm) 2 Depth: (cm) 0.1 Area: (cm) 3.299 Volume: (cm) 0.33 % Reduction in Area: -7.4% % Reduction in Volume: -7.5% Epithelialization: Small (1-33%) Tunneling: No Undermining: No Wound Description Classification: Grade 2 Wound Margin: Flat and Intact Exudate Amount: Medium Exudate Type: Serosanguineous Exudate Color: red, brown Foul Odor After Cleansing: No Slough/Fibrino Yes Wound Bed Granulation Amount: Large (67-100%) Exposed Structure Granulation Quality: Pink, Pale Fascia Exposed: No Necrotic Amount: Small (1-33%) Fat Layer (Subcutaneous Tissue) Exposed: Yes Necrotic Quality: Adherent Slough Tendon Exposed: No Muscle Exposed: No Joint Exposed: No Bone Exposed: No Treatment Notes Wound #2 (Toe Great) Wound Laterality: Right, Medial Cleanser Soap and Water Discharge Instruction: May shower and wash wound with dial antibacterial soap and water prior to dressing change. Peri-Wound Care Topical Primary Dressing KerraCel Ag Gelling Fiber Dressing, 2x2 in (silver alginate) Discharge Instruction: Apply silver alginate to wound bed as instructed Secondary Dressing Woven Gauze Sponges 2x2 in Discharge Instruction: Apply over primary dressing as directed. Secured With Conforming Stretch Gauze Bandage, Sterile 2x75 (in/in) Discharge Instruction: Secure with stretch gauze as directed. Compression Wrap Compression Stockings Add-Ons Electronic Signature(s) Signed: 09/29/2020 8:31:36 AM By: Stacey Mu RN Entered By: [name] on 09/29/2020 08:31:36 -------------------------------------------------------------------------------- Wound Assessment Details Patient Name: Date of Service: [name], [name] 09/29/2020 8:00 A M Medical Record Number: [id number] Patient Account  Number: [id number] Date of Birth/Sex: Treating RN: 1965-11-11 (54 y.o. [name], [name] Primary Care Shai Mckenzie: [name] Other Clinician: Referring Shakisha Abend: Treating Praise Dolecki/Extender: [name] in Treatment: 2 Wound Status Wound Number: 3 Primary Diabetic Wound/Ulcer of the Lower Extremity Etiology: Wound Location: Right T Great oe  Wound Open Wounding Event: Trauma Status: Date Acquired: 10/23/2017 Comorbid Anemia, Sleep Apnea, Hypotension, Type II Diabetes, Weeks Of Treatment: 2 History: Osteoarthritis, Neuropathy, Received Chemotherapy Clustered Wound: No Photos Photo Uploaded By: Benjaman Kindler on 09/30/2020 13:34:51 Wound Measurements Length: (cm) 3 Width: (cm) 3 Depth: (cm) 0.3 Area: (cm) 7.069 Volume: (cm) 2.121 % Reduction in Area: -10.3% % Reduction in Volume: -10.3% Epithelialization: None Tunneling: No Undermining: No Wound Description Classification: Grade 2 Wound Margin: Epibole Exudate Amount: Medium Exudate Type: Serosanguineous Exudate Color: red, brown Foul Odor After Cleansing: No Slough/Fibrino Yes Wound Bed Granulation Amount: Large (67-100%) Exposed Structure Granulation Quality: Pink Fascia Exposed: No Necrotic Amount: Small (1-33%) Fat Layer (Subcutaneous Tissue) Exposed: Yes Necrotic Quality: Adherent Slough Tendon Exposed: No Muscle Exposed: No Joint Exposed: No Bone Exposed: No Treatment Notes Wound #3 (Toe Great) Wound Laterality: Right Cleanser Soap and Water Discharge Instruction: May shower and wash wound with dial antibacterial soap and water prior to dressing change. Peri-Wound Care Topical Primary Dressing KerraCel Ag Gelling Fiber Dressing, 2x2 in (silver alginate) Discharge Instruction: Apply silver alginate to wound bed as instructed Secondary Dressing Woven Gauze Sponges 2x2 in Discharge Instruction: Apply over primary dressing as directed. Secured With Conforming Stretch  Gauze Bandage, Sterile 2x75 (in/in) Discharge Instruction: Secure with stretch gauze as directed. Compression Wrap Compression Stockings Add-Ons Electronic Signature(s) Signed: 09/29/2020 8:30:59 AM By: Stacey Mu RN Entered By: [name] on 09/29/2020 08:30:59 -------------------------------------------------------------------------------- Wound Assessment Details Patient Name: Date of Service: [name], [name] 09/29/2020 8:00 A M Medical Record Number: [id number] Patient Account Number: [id number] Date of Birth/Sex: Treating RN: November 11, 1965 (54 y.o. [name], [name] Primary Care Donnavan Covault: [name] Other Clinician: Referring Lesha Jager: Treating Kearie Mennen/Extender: [name] in Treatment: 2 Wound Status Wound Number: 4 Primary Diabetic Wound/Ulcer of the Lower Extremity Etiology: Wound Location: Left, Lateral Foot Wound Open Wounding Event: Shear/Friction Status: Date Acquired: 08/23/2020 Comorbid Anemia, Sleep Apnea, Hypotension, Type II Diabetes, Weeks Of Treatment: 2 History: Osteoarthritis, Neuropathy, Received Chemotherapy Clustered Wound: No Photos Photo Uploaded By: Benjaman Kindler on 09/30/2020 13:35:29 Wound Measurements Length: (cm) 2.5 Width: (cm) 2.3 Depth: (cm) 0.2 Area: (cm) 4.516 Volume: (cm) 0.903 % Reduction in Area: 4.2% % Reduction in Volume: -91.7% Epithelialization: Small (1-33%) Tunneling: No Undermining: No Wound Description Classification: Grade 2 Wound Margin: Flat and Intact Exudate Amount: Medium Exudate Type: Serosanguineous Exudate Color: red, brown Foul Odor After Cleansing: No Slough/Fibrino Yes Wound Bed Granulation Amount: Small (1-33%) Exposed Structure Granulation Quality: Pink Fascia Exposed: No Necrotic Amount: Large (67-100%) Fat Layer (Subcutaneous Tissue) Exposed: No Necrotic Quality: Adherent Slough Tendon Exposed: No Muscle Exposed: No Joint Exposed: No Bone  Exposed: No Treatment Notes Wound #4 (Foot) Wound Laterality: Left, Lateral Cleanser Soap and Water Discharge Instruction: May shower and wash wound with dial antibacterial soap and water prior to dressing change. Peri-Wound Care Topical Primary Dressing Santyl Ointment Discharge Instruction: Apply nickel thick amount to wound bed as instructed Secondary Dressing Woven Gauze Sponge, Non-Sterile 4x4 in Discharge Instruction: Apply over primary dressing as directed. Secured With [employer], 4.5x3.1 (in/yd) Discharge Instruction: Secure with Kerlix as directed. Compression Wrap Compression Stockings Add-Ons Electronic Signature(s) Signed: 09/29/2020 8:30:30 AM By: Stacey Mu RN Entered By: [name] on 09/29/2020 08:30:29 -------------------------------------------------------------------------------- Wound Assessment Details Patient Name: Date of Service: [name], [name] 09/29/2020 8:00 A M Medical Record Number: [id number] Patient Account Number: [id number] Date of Birth/Sex: Treating RN: 1966/09/05 (54 y.o. [name], [name] Primary Care Janelli Welling: [name] Other Clinician:  Referring Aliani Caccavale: Treating Holt Woolbright/Extender: Stacey LimerickStone III, Hoyt Anderson, [name] [name]: 2 Wound Status Wound Number: 5 Primary Lesion Etiology: Wound Location: Right, Lateral Lower Leg Wound Open Wounding Event: Gradually Appeared Status: Date Acquired: 08/23/2020 Comorbid Anemia, Sleep Apnea, Hypotension, Type II Diabetes, Weeks Of Treatment: 2 History: Osteoarthritis, Neuropathy, Received Chemotherapy Clustered Wound: No Photos Photo Uploaded By: Benjaman KindlerJones, Dedrick on 09/30/2020 13:35:58 Wound Measurements Length: (cm) 1.7 Width: (cm) 0.4 Depth: (cm) 0.1 Area: (cm) 0.534 Volume: (cm) 0.053 % Reduction in Area: -126.3% % Reduction in Volume: -120.8% Epithelialization: Large (67-100%) Tunneling: No Undermining: No Wound  Description Classification: Full Thickness Without Exposed Support Structures Wound Margin: Flat and Intact Exudate Amount: Medium Exudate Type: Serosanguineous Exudate Color: red, brown Foul Odor After Cleansing: No Slough/Fibrino No Wound Bed Granulation Amount: Large (67-100%) Exposed Structure Granulation Quality: Red, Pink Fascia Exposed: No Necrotic Amount: None Present (0%) Fat Layer (Subcutaneous Tissue) Exposed: Yes Tendon Exposed: No Muscle Exposed: No Joint Exposed: No Bone Exposed: No Treatment Notes Wound #5 (Lower Leg) Wound Laterality: Right, Lateral Cleanser Soap and Water Discharge Instruction: May shower and wash wound with dial antibacterial soap and water prior to dressing change. Peri-Wound Care Topical Mupirocin Ointment Discharge Instruction: Apply Mupirocin (Bactroban) as instructed Clobetasol Cream Discharge Instruction: mixed with mupirocin Primary Dressing Secondary Dressing Secured With Compression Wrap Compression Stockings Add-Ons Electronic Signature(s) Signed: 09/29/2020 8:29:39 AM By: Stacey MuBreedlove, [name] RN Entered By: Stacey MuBreedlove, [name] on 09/29/2020 08:29:39 -------------------------------------------------------------------------------- Wound Assessment Details Patient Name: Date of Service: Stacey FlashHA [name], [name] 09/29/2020 8:00 A M Medical Record Number: [id number] Patient Account Number: [id number] Date of Birth/Sex: Treating RN: 06-21-1966 (54 y.o. Stacey RowanF) Breedlove, [name] Primary Care Annlee Glandon: Stacey PalauAnderson, [name] Other Clinician: Referring Madelin Weseman: Treating Javarious Elsayed/Extender: Stacey LimerickStone III, Hoyt Anderson, [name] [name]: 2 Wound Status Wound Number: 6 Primary Lesion Etiology: Wound Location: Left, Lateral Lower Leg Wound Open Wounding Event: Gradually Appeared Status: Date Acquired: 09/24/2020 Comorbid Anemia, Sleep Apnea, Hypotension, Type II Diabetes, Weeks Of Treatment: 0 History: Osteoarthritis, Neuropathy, Received  Chemotherapy Clustered Wound: No Photos Photo Uploaded By: Benjaman KindlerJones, Dedrick on 09/30/2020 13:35:59 Wound Measurements Length: (cm) 1.4 Width: (cm) 1 Depth: (cm) 0.1 Area: (cm) 1.1 Volume: (cm) 0.11 % Reduction in Area: % Reduction in Volume: Epithelialization: Small (1-33%) Tunneling: No Undermining: No Wound Description Classification: Partial Thickness Wound Margin: Distinct, outline attached Exudate Amount: Medium Exudate Type: Serosanguineous Exudate Color: red, brown Foul Odor After Cleansing: No Slough/Fibrino Yes Wound Bed Granulation Amount: Large (67-100%) Exposed Structure Granulation Quality: Red, Pink Fascia Exposed: No Necrotic Amount: Small (1-33%) Fat Layer (Subcutaneous Tissue) Exposed: No Necrotic Quality: Adherent Slough Tendon Exposed: No Muscle Exposed: No Joint Exposed: No Bone Exposed: No Treatment Notes Wound #6 (Lower Leg) Wound Laterality: Left, Lateral Cleanser Soap and Water Discharge Instruction: May shower and wash wound with dial antibacterial soap and water prior to dressing change. Peri-Wound Care Topical Mupirocin Ointment Discharge Instruction: Apply Mupirocin (Bactroban) as instructed Clobetasol Cream Discharge Instruction: mixed with mupirocin Primary Dressing Secondary Dressing Secured With Compression Wrap Compression Stockings Add-Ons Electronic Signature(s) Signed: 09/29/2020 8:34:12 AM By: Stacey MuBreedlove, [name] RN Entered By: Stacey MuBreedlove, [name] on 09/29/2020 08:34:11 -------------------------------------------------------------------------------- Wound Assessment Details Patient Name: Date of Service: Stacey FlashHA [name], Lexii 09/29/2020 8:00 A M Medical Record Number: [id number] Patient Account Number: [id number] Date of Birth/Sex: Treating RN: 06-21-1966 (54 y.o. Stacey RowanF) Breedlove, [name] Primary Care Demere Dotzler: Stacey PalauAnderson, [name] Other Clinician: Referring Brevyn Ring: Treating Kamy Poinsett/Extender: Stacey LimerickStone III, Hoyt Anderson, [name] Weeks in  Treatment: 2 Wound Status Wound Number: 7 Primary Lesion Etiology: Wound  Location: Left, Proximal Lower Leg Wound Open Wounding Event: Gradually Appeared Status: Date Acquired: 09/24/2020 Comorbid Anemia, Sleep Apnea, Hypotension, Type II Diabetes, Weeks Of Treatment: 0 History: Osteoarthritis, Neuropathy, Received Chemotherapy Clustered Wound: No Photos Photo Uploaded By: Benjaman Kindler on 09/30/2020 13:35:29 Wound Measurements Length: (cm) 1 Width: (cm) 1 Depth: (cm) 0.1 Area: (cm) 0.785 Volume: (cm) 0.079 % Reduction in Area: % Reduction in Volume: Epithelialization: Small (1-33%) Tunneling: No Undermining: No Wound Description Classification: Partial Thickness Wound Margin: Distinct, outline attached Exudate Amount: Medium Exudate Type: Serosanguineous Exudate Color: red, brown Foul Odor After Cleansing: No Slough/Fibrino Yes Wound Bed Granulation Amount: Large (67-100%) Exposed Structure Granulation Quality: Red, Pink Fascia Exposed: No Necrotic Amount: Small (1-33%) Fat Layer (Subcutaneous Tissue) Exposed: No Necrotic Quality: Adherent Slough Tendon Exposed: No Muscle Exposed: No Joint Exposed: No Bone Exposed: No Treatment Notes Wound #7 (Lower Leg) Wound Laterality: Left, Proximal Cleanser Soap and Water Discharge Instruction: May shower and wash wound with dial antibacterial soap and water prior to dressing change. Peri-Wound Care Topical Mupirocin Ointment Discharge Instruction: Apply Mupirocin (Bactroban) as instructed Clobetasol Cream Discharge Instruction: mixed with mupirocin Primary Dressing Secondary Dressing Secured With Compression Wrap Compression Stockings Add-Ons Electronic Signature(s) Signed: 09/29/2020 8:35:41 AM By: Stacey Mu RN Entered By: [name] on 09/29/2020 08:35:40 -------------------------------------------------------------------------------- Wound Assessment Details Patient Name: Date of  Service: [name], [name] 09/29/2020 8:00 A M Medical Record Number: [id number] Patient Account Number: [id number] Date of Birth/Sex: Treating RN: [birth year] (54 y.o. [name], [name] Primary Care Brigid Vandekamp: [name] Other Clinician: Referring Jaelle Campanile: Treating Delfina [name]/Extender: [name] in Treatment: 2 Wound Status Wound Number: 8 Primary Lesion Etiology: Wound Location: Right Forearm Wound Open Wounding Event: Gradually Appeared Status: Date Acquired: 09/24/2020 Comorbid Anemia, Sleep Apnea, Hypotension, Type II Diabetes, Weeks Of Treatment: 0 History: Osteoarthritis, Neuropathy, Received Chemotherapy Clustered Wound: No Photos Photo Uploaded By: Benjaman Kindler on 09/30/2020 13:36:15 Wound Measurements Length: (cm) 1 Width: (cm) 1.4 Depth: (cm) 0.1 Area: (cm) 1.1 Volume: (cm) 0.11 % Reduction in Area: % Reduction in Volume: Epithelialization: Small (1-33%) Tunneling: No Undermining: No Wound Description Classification: Partial Thickness Wound Margin: Distinct, outline attached Exudate Amount: Medium Exudate Type: Serosanguineous Exudate Color: red, brown Foul Odor After Cleansing: No Slough/Fibrino Yes Wound Bed Granulation Amount: Large (67-100%) Exposed Structure Granulation Quality: Red, Pink Fascia Exposed: No Necrotic Amount: Small (1-33%) Fat Layer (Subcutaneous Tissue) Exposed: No Necrotic Quality: Adherent Slough Tendon Exposed: No Muscle Exposed: No Joint Exposed: No Bone Exposed: No Treatment Notes Wound #8 (Forearm) Wound Laterality: Right Cleanser Soap and Water Discharge Instruction: May shower and wash wound with dial antibacterial soap and water prior to dressing change. Peri-Wound Care Topical Mupirocin Ointment Discharge Instruction: Apply Mupirocin (Bactroban) as instructed Clobetasol Cream Discharge Instruction: mixed with mupirocin Primary Dressing Secondary Dressing Secured  With Compression Wrap Compression Stockings Add-Ons Electronic Signature(s) Signed: 09/29/2020 8:36:56 AM By: Stacey Mu RN Entered By: [name] on 09/29/2020 08:36:56 -------------------------------------------------------------------------------- Vitals Details Patient Name: Date of Service: [name], [name] 09/29/2020 8:00 A M Medical Record Number: [id number] Patient Account Number: [id number] Date of Birth/Sex: Treating RN: Jun 23, 1966 (54 y.o. [name], [name] Primary Care Nikaela Coyne: [name] Other Clinician: Referring Madalyn Legner: Treating Onur Mori/Extender: [name] in Treatment: 2 Vital Signs Time Taken: 08:04 Temperature (F): 98.4 Height (in): 66 Pulse (bpm): 74 Weight (lbs): 195 Respiratory Rate (breaths/min): 18 Body Mass Index (BMI): 31.5 Blood Pressure (mmHg): 95/64 Reference Range: 80 - 120 mg / dl Electronic Signature(s)  Signed: 09/30/2020 3:27:39 PM By: Stacey Mu RN Entered By: [name] on 09/29/2020 08:04:36

## 2020-10-08 ENCOUNTER — Encounter (HOSPITAL_BASED_OUTPATIENT_CLINIC_OR_DEPARTMENT_OTHER): Payer: BC Managed Care – PPO | Admitting: Internal Medicine

## 2020-10-08 ENCOUNTER — Other Ambulatory Visit (HOSPITAL_COMMUNITY): Payer: Self-pay | Admitting: Internal Medicine

## 2020-10-08 ENCOUNTER — Other Ambulatory Visit (HOSPITAL_BASED_OUTPATIENT_CLINIC_OR_DEPARTMENT_OTHER): Payer: Self-pay | Admitting: Internal Medicine

## 2020-10-08 ENCOUNTER — Other Ambulatory Visit (HOSPITAL_COMMUNITY)
Admission: RE | Admit: 2020-10-08 | Discharge: 2020-10-08 | Disposition: A | Discharge status: Home / Self Care | Payer: BC Managed Care – PPO | Source: Other Acute Inpatient Hospital | Attending: Internal Medicine | Admitting: Internal Medicine

## 2020-10-08 ENCOUNTER — Ambulatory Visit (HOSPITAL_COMMUNITY)
Admission: RE | Admit: 2020-10-08 | Discharge: 2020-10-08 | Disposition: A | Discharge status: Home / Self Care | Payer: BC Managed Care – PPO | Source: Ambulatory Visit | Attending: Internal Medicine | Admitting: Internal Medicine

## 2020-10-08 ENCOUNTER — Other Ambulatory Visit: Payer: Self-pay

## 2020-10-08 DIAGNOSIS — E11621 Type 2 diabetes mellitus with foot ulcer: Secondary | ICD-10-CM

## 2020-10-08 DIAGNOSIS — E1169 Type 2 diabetes mellitus with other specified complication: Secondary | ICD-10-CM | POA: Insufficient documentation

## 2020-10-08 DIAGNOSIS — M869 Osteomyelitis, unspecified: Secondary | ICD-10-CM | POA: Insufficient documentation

## 2020-10-08 DIAGNOSIS — L97509 Non-pressure chronic ulcer of other part of unspecified foot with unspecified severity: Secondary | ICD-10-CM | POA: Diagnosis present

## 2020-10-08 NOTE — Progress Notes (Addendum)
ALYSHIA, KERNAN ([id number]) Visit Report for 10/08/2020 Arrival Information Details Patient Name: Date of Service: [name], [name] 10/08/2020 9:30 A M Medical Record Number: [id number] Patient Account Number: [id number] Date of Birth/Sex: Treating RN: 01-18-1966 (54 y.o. Freddy Finner Primary Care Letesha Klecker: Elizabeth Palau Other Clinician: Referring Khylon Davies: Treating Lura Falor/Extender: Tyson Babinski in Treatment: 3 Visit Information History Since Last Visit All ordered tests and consults were completed: No Patient Arrived: Ambulatory Added or deleted any medications: No Arrival Time: 09:50 Any new allergies or adverse reactions: No Accompanied By: self Had a fall or experienced change in No Transfer Assistance: None activities of daily living that may affect Patient Identification Verified: Yes risk of falls: Secondary Verification Process Completed: Yes Signs or symptoms of abuse/neglect since last visito No Patient Requires Transmission-Based Precautions: No Hospitalized since last visit: No Patient Has Alerts: No Implantable device outside of the clinic excluding No cellular tissue based products placed in the center since last visit: Has Dressing in Place as Prescribed: Yes Pain Present Now: Yes Electronic Signature(s) Signed: 10/08/2020 5:23:39 PM By: Yevonne Pax RN Entered By: Yevonne Pax on 10/08/2020 09:51:15 -------------------------------------------------------------------------------- Lower Extremity Assessment Details Patient Name: Date of Service: [name], [name] 10/08/2020 9:30 A M Medical Record Number: [id number] Patient Account Number: [id number] Date of Birth/Sex: Treating RN: 1966-03-13 (54 y.o. Freddy Finner Primary Care Markan Cazarez: Elizabeth Palau Other Clinician: Referring Roe Wilner: Treating Geovanie Winnett/Extender: Tyson Babinski in Treatment: 3 Edema Assessment Assessed: Kyra Searles: No] [Right: No] Edema:  [Left: No] [Right: No] Calf Left: Right: Point of Measurement: 30 cm From Medial Instep 37 cm 37 cm Ankle Left: Right: Point of Measurement: 9 cm From Medial Instep 23 cm 23 cm Electronic Signature(s) Signed: 10/08/2020 5:23:39 PM By: Yevonne Pax RN Entered By: Yevonne Pax on 10/08/2020 10:00:02 -------------------------------------------------------------------------------- Multi Wound Chart Details Patient Name: Date of Service: [name] 10/08/2020 9:30 A M Medical Record Number: [id number] Patient Account Number: [id number] Date of Birth/Sex: Treating RN: Feb 26, 1966 (54 y.o. F) Primary Care Makih Stefanko: Elizabeth Palau Other Clinician: Referring Ananda Caya: Treating Merel Santoli/Extender: Tyson Babinski in Treatment: 3 Vital Signs Height(in): 66 Pulse(bpm): 78 Weight(lbs): 195 Blood Pressure(mmHg): 104/70 Body Mass Index(BMI): 31 Temperature(F): 98.4 Respiratory Rate(breaths/min): 18 Photos: [4:No Photos Left, Lateral Foot] [N/A:N/A N/A] Wound Location: [4:Shear/Friction] [N/A:N/A] Wounding Event: [4:Diabetic Wound/Ulcer of the Lower] [N/A:N/A] Primary Etiology: [4:Extremity Anemia, Sleep Apnea, Hypotension, N/A] Comorbid History: [4:Type II Diabetes, Osteoarthritis, Neuropathy, Received Chemotherapy 08/23/2020] [N/A:N/A] Date Acquired: [4:3] [N/A:N/A] Weeks of Treatment: [4:Open] [N/A:N/A] Wound Status: [4:3x2.5x1.6] [N/A:N/A] Measurements L x W x D (cm) [4:5.89] [N/A:N/A] A (cm) : rea [4:9.425] [N/A:N/A] Volume (cm) : [4:-25.00%] [N/A:N/A] % Reduction in A rea: [4:-1901.10%] [N/A:N/A] % Reduction in Volume: [4:Grade 2] [N/A:N/A] Classification: [4:Medium] [N/A:N/A] Exudate A mount: [4:Serosanguineous] [N/A:N/A] Exudate Type: [4:red, brown] [N/A:N/A] Exudate Color: [4:Flat and Intact] [N/A:N/A] Wound Margin: [4:Small (1-33%)] [N/A:N/A] Granulation A mount: [4:Pink] [N/A:N/A] Granulation Quality: [4:Large (67-100%)] [N/A:N/A] Necrotic A  mount: [4:Fat Layer (Subcutaneous Tissue): Yes N/A] Exposed Structures: [4:Bone: Yes Fascia: No Tendon: No Muscle: No Joint: No None] [N/A:N/A] Treatment Notes Electronic Signature(s) Signed: 10/08/2020 4:58:57 PM By: Baltazar Najjar MD Entered By: Baltazar Najjar on 10/08/2020 10:38:56 -------------------------------------------------------------------------------- Multi-Disciplinary Care Plan Details Patient Name: Date of Service: [name], [name] 10/08/2020 9:30 A M Medical Record Number: [id number] Patient Account Number: [id number] Date of Birth/Sex: Treating RN: [birth year] (54 y.o. [name] Primary Care Odile Veloso: Elizabeth Palau Other Clinician: Referring Kedron Uno: Treating Ariam Mol/Extender: Tyson Babinski in  Treatment: 3 Active Inactive Abuse / Safety / Falls / Self Care Management Nursing Diagnoses: Potential for falls Goals: Patient/caregiver will verbalize/demonstrate measures taken to prevent injury and/or falls Date Initiated: 09/15/2020 Target Resolution Date: 10/13/2020 Goal Status: Active Interventions: Assess fall risk on admission and as needed Assess impairment of mobility on admission and as needed per policy Notes: Nutrition Nursing Diagnoses: Impaired glucose control: actual or potential Potential for alteratiion in Nutrition/Potential for imbalanced nutrition Goals: Patient/caregiver will maintain therapeutic glucose control Date Initiated: 09/15/2020 Target Resolution Date: 10/13/2020 Goal Status: Active Interventions: Assess HgA1c results as ordered upon admission and as needed Provide education on elevated blood sugars and impact on wound healing Treatment Activities: Patient referred to Primary Care Physician for further nutritional evaluation : 09/15/2020 Notes: Wound/Skin Impairment Nursing Diagnoses: Impaired tissue integrity Knowledge deficit related to ulceration/compromised skin  integrity Goals: Patient/caregiver will verbalize understanding of skin care regimen Date Initiated: 09/15/2020 Target Resolution Date: 10/13/2020 Goal Status: Active Ulcer/skin breakdown will have a volume reduction of 30% by week 4 Date Initiated: 09/15/2020 Target Resolution Date: 10/13/2020 Goal Status: Active Interventions: Assess patient/caregiver ability to obtain necessary supplies Assess patient/caregiver ability to perform ulcer/skin care regimen upon admission and as needed Assess ulceration(s) every visit Provide education on ulcer and skin care Treatment Activities: Skin care regimen initiated : 09/15/2020 Topical wound management initiated : 09/15/2020 Notes: Electronic Signature(s) Signed: 10/08/2020 5:46:53 PM By: Zandra Abts RN, BSN Entered By: Zandra Abts on 10/08/2020 17:25:53 -------------------------------------------------------------------------------- Pain Assessment Details Patient Name: Date of Service: [name], [name] 10/08/2020 9:30 A M Medical Record Number: [id number] Patient Account Number: [id number] Date of Birth/Sex: Treating RN: 03-30-1966 (54 y.o. Freddy Finner Primary Care Thamar Holik: Elizabeth Palau Other Clinician: Referring Sohail Capraro: Treating Alleyah Twombly/Extender: Tyson Babinski in Treatment: 3 Active Problems Location of Pain Severity and Description of Pain Patient Has Paino Yes Site Locations With Dressing Change: Yes Duration of the Pain. Constant / Intermittento Constant Rate the pain. Current Pain Level: 8 Worst Pain Level: 10 Least Pain Level: 6 Character of Pain Describe the Pain: Aching, Burning, Shooting, Throbbing Pain Management and Medication Current Pain Management: Medication: Yes Cold Application: No Rest: Yes Massage: No Activity: No T.E.N.S.: No Heat Application: No Leg drop or elevation: No Is the Current Pain Management Adequate: Inadequate How does your wound impact your  activities of daily livingo Sleep: Yes Bathing: No Appetite: Yes Relationship With Others: No Bladder Continence: No Emotions: No Bowel Continence: No Work: No Toileting: No Drive: No Dressing: No Hobbies: No Electronic Signature(s) Signed: 10/08/2020 5:23:39 PM By: Yevonne Pax RN Entered By: Yevonne Pax on 10/08/2020 09:52:30 -------------------------------------------------------------------------------- Patient/Caregiver Education Details Patient Name: Date of Service: [name], [name] 12/17/2021andnbsp9:30 A M Medical Record Number: [id number] Patient Account Number: [id number] Date of Birth/Gender: Treating RN: 1966/08/26 (54 y.o. [name] Primary Care Physician: Elizabeth Palau Other Clinician: Referring Physician: Treating Physician/Extender: Tyson Babinski in Treatment: 3 Education Assessment Education Provided To: Patient Education Topics Provided Wound/Skin Impairment: Methods: Explain/Verbal Responses: State content correctly Electronic Signature(s) Signed: 10/08/2020 5:46:53 PM By: Zandra Abts RN, BSN Entered By: Zandra Abts on 10/08/2020 17:26:05 -------------------------------------------------------------------------------- Wound Assessment Details Patient Name: Date of Service: Stacey, [name] 10/08/2020 9:30 A M Medical Record Number: [id number] Patient Account Number: [id number] Date of Birth/Sex: Treating RN: 1966/02/13 (54 y.o. Freddy Finner Primary Care Marilin Kofman: Elizabeth Palau Other Clinician: Referring Asuna Peth: Treating Colsen Modi/Extender: Tyson Babinski in Treatment: 3 Wound Status Wound Number: 4 Primary Diabetic Wound/Ulcer  of the Lower Extremity Etiology: Wound Location: Left, Lateral Foot Wound Open Wounding Event: Shear/Friction Status: Date Acquired: 08/23/2020 Comorbid Anemia, Sleep Apnea, Hypotension, Type II Diabetes, Weeks Of Treatment: 3 History:  Osteoarthritis, Neuropathy, Received Chemotherapy Clustered Wound: No Wound Measurements Length: (cm) 3 Width: (cm) 2.5 Depth: (cm) 1.6 Area: (cm) 5.89 Volume: (cm) 9.425 % Reduction in Area: -25% % Reduction in Volume: -1901.1% Epithelialization: None Tunneling: No Undermining: No Wound Description Classification: Grade 2 Wound Margin: Flat and Intact Exudate Amount: Medium Exudate Type: Serosanguineous Exudate Color: red, brown Foul Odor After Cleansing: No Slough/Fibrino Yes Wound Bed Granulation Amount: Small (1-33%) Exposed Structure Granulation Quality: Pink Fascia Exposed: No Necrotic Amount: Large (67-100%) Fat Layer (Subcutaneous Tissue) Exposed: Yes Necrotic Quality: Adherent Slough Tendon Exposed: No Muscle Exposed: No Joint Exposed: No Bone Exposed: Yes Electronic Signature(s) Signed: 10/08/2020 5:23:39 PM By: Yevonne Pax RN Entered By: Yevonne Pax on 10/08/2020 10:01:39 -------------------------------------------------------------------------------- Vitals Details Patient Name: Date of Service: [name], [name] 10/08/2020 9:30 A M Medical Record Number: [id number] Patient Account Number: [id number] Date of Birth/Sex: Treating RN: 06-06-1966 (53 y.o. Freddy Finner Primary Care Nikolas Casher: Elizabeth Palau Other Clinician: Referring Hermenegildo Clausen: Treating Kaitland Lewellyn/Extender: Tyson Babinski in Treatment: 3 Vital Signs Time Taken: 09:51 Temperature (F): 98.4 Height (in): 66 Pulse (bpm): 78 Weight (lbs): 195 Respiratory Rate (breaths/min): 18 Body Mass Index (BMI): 31.5 Blood Pressure (mmHg): 104/70 Reference Range: 80 - 120 mg / dl Electronic Signature(s) Signed: 10/08/2020 5:23:39 PM By: Yevonne Pax RN Entered By: Yevonne Pax on 10/08/2020 09:51:39

## 2020-10-08 NOTE — Progress Notes (Signed)
[name] ([id number]) Visit Report for 10/08/2020 Debridement Details Patient Name: Date of Service: [name], [name] 10/08/2020 9:30 A M Medical Record Number: [id number] Patient Account Number: [id number] Date of Birth/Sex: Treating RN: 10-19-1966 (54 y.o. F) Primary Care Provider: Elizabeth Palau Other Clinician: Referring Provider: Treating Provider/Extender: Tyson Babinski in Treatment: 3 Debridement Performed for Assessment: Wound #4 Left,Lateral Foot Performed By: Physician Maxwell Caul., MD Debridement Type: Debridement Severity of Tissue Pre Debridement: Bone involvement without necrosis Level of Consciousness (Pre-procedure): Awake and Alert Pre-procedure Verification/Time Out Yes - 10:35 Taken: Start Time: 10:35 Pain Control: Lidocaine Injectable : 1 T Area Debrided (L x W): otal 1 (cm) x 1 (cm) = 1 (cm) Tissue and other material debrided: Non-Viable, Bone Level: Skin/Subcutaneous Tissue/Muscle/Bone Debridement Description: Excisional Instrument: Rongeur Specimen: Tissue Culture Number of Specimens T aken: 1 Bleeding: Moderate Hemostasis Achieved: Silver Nitrate End Time: 10:36 Procedural Pain: 0 Post Procedural Pain: 0 Response to Treatment: Procedure was tolerated well Level of Consciousness (Post- Awake and Alert procedure): Post Debridement Measurements of Total Wound Length: (cm) 3 Width: (cm) 2.5 Depth: (cm) 1.6 Volume: (cm) 9.425 Character of Wound/Ulcer Post Debridement: Stable Severity of Tissue Post Debridement: Bone involvement without necrosis Post Procedure Diagnosis Same as Pre-procedure Electronic Signature(s) Signed: 10/08/2020 4:58:57 PM By: Baltazar Najjar MD Entered By: Baltazar Najjar on 10/08/2020 10:44:51 -------------------------------------------------------------------------------- HPI Details Patient Name: Date of Service: [name], [name] 10/08/2020 9:30 A M Medical Record Number:  [id number] Patient Account Number: [id number] Date of Birth/Sex: Treating RN: 06-Nov-1965 (54 y.o. F) Primary Care Provider: Elizabeth Palau Other Clinician: Referring Provider: Treating Provider/Extender: Tyson Babinski in Treatment: 3 History of Present Illness HPI Description: 09/15/2020 upon evaluation today patient appears to be doing somewhat poorly in regard to the wounds on her feet. She has a wound on the right lateral foot, right medial great toe, right dorsal great toe, left lateral foot, and right lateral lower leg. Currently based on what we are seeing the patient states that the worst issue is her right great toe. She dates that she wishes Dr. Victorino Dike had actually gone ahead and performed amputation at this site as well when he did the other toes they have all healed well that is the right third toe, left great toe, and left third toe. All are completely healed and did excellent. Currently she has been transition into Cam boots for both feet previously she was using postop shoes but apparently this caused some rubbing on the lateral aspects of both feet and at the medial aspect of her right great toe which unfortunately has caused her a lot of discomfort and issues. She has been given mupirocin and betamethasone for some of the small wounds that occur over her legs and arms frequently. 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 Otherwise she also does have diabetes mellitus type 2. Currently the patient is on methotrexate and also undergoes chemotherapy infusions every 6 months for the Wegener's granulomatosis currently her amputations were asked on August 12, 2020 that was with Dr. Victorino Dike. His physician assistant Jill Alexanders referred her to me today. 09/22/2020 upon evaluation today patient appears to be making good progress with the Santyl at  this point. I am extremely pleased with where things stand today and I do believe him to be able to debride some of the left lateral foot wound which we have not been  able to do as the eschar was too thick. I think this is doing much better currently. 09/29/2020 upon evaluation today patient appears to be doing decently well in regard to her ulcers on her feet. I do believe the Santyl has been of benefit. With that being said I am concerned about the fact that she continues to have wounds over her arms and legs bilaterally which seem to continually be an issue. Fortunately there is no signs of active infection at this time but again I am not really sure where these are coming from. She is seeing dermatology they had suggested this was pyoderma but that seems kind of unusual based on how they seem to come up. She has been using a mix of steroid and mupirocin over these areas. 12/17; this is a patient called in urgently to be seen. She was apparently in the shower washing the wound on the left lateral fifth metatarsal head and noticed a hole present. She also complained of increasing pain.No systemic signs or symptoms. 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 She says her wound started from friction with Cam boots that she had after amputation of three toes two on the left and one on the right Electronic Signature(s) Signed: 10/08/2020 4:58:57 PM By: Baltazar Najjar MD Entered By: Baltazar Najjar on 10/08/2020 10:41:08 -------------------------------------------------------------------------------- Physical Exam Details Patient Name: Date of Service: [name], [name] 10/08/2020 9:30 A M Medical Record Number: [id number] Patient Account Number: [id number] Date of Birth/Sex: Treating RN: 12-Jan-1966 (54 y.o. F) Primary Care Provider: Elizabeth Palau Other Clinician: Referring Provider: Treating Provider/Extender: Tyson Babinski in Treatment: 3 Constitutional Sitting or standing Blood Pressure is within target range for patient.. Pulse regular and within target range for patient.Marland Kitchen Respirations regular, non-labored and within target range.. Temperature is normal and within the target range for the patient.Marland Kitchen Appears in no distress. Cardiovascular Pulses are palpable on the left. Notes Wound exam; the area she is concerned about is on the left lateral fifth metatarsal head. In the middle of this wound there is a substantial opening that goes right down to necrotic bone. There is no purulent drainage however this has deep undermining. I used rongeurs to obtain a piece of bone for CandS and pathology. There is no erythema around the wound no palpable tenderness Electronic Signature(s) Signed: 10/08/2020 4:58:57 PM By: Baltazar Najjar MD Entered By: Baltazar Najjar on 10/08/2020 10:42:42 -------------------------------------------------------------------------------- Physician Orders Details Patient Name: Date of Service: [name], Latoyia 10/08/2020 9:30 A M Medical Record Number: [id number] Patient Account Number: [id number] Date of Birth/Sex: Treating RN: [birth year] (54 y.o. [name] Primary Care Provider: Other Clinician: Elizabeth Palau Referring Provider: Treating Provider/Extender: Tyson Babinski in Treatment: 3 Verbal / Phone Orders: No Diagnosis Coding ICD-10 Coding Code Description E11.621 Type 2 diabetes mellitus with foot ulcer L97.522 Non-pressure chronic ulcer of other part of left foot with fat layer exposed L97.512 Non-pressure chronic ulcer of other part of right foot with fat layer exposed L97.812 Non-pressure chronic ulcer of other part of right lower leg with fat layer exposed M31.31 Wegener's granulomatosis with renal involvement N18.9 Chronic kidney disease, unspecified Follow-up Appointments ppointment in 1 week. - Keep appt for 12/22 with  Leonard Schwartz Return A Bathing/ Shower/ Hygiene May shower and wash wound with soap and water. - shower with hibiclens soap weekly Off-Loading Removable cast walker boot to: - Cam boot both feet to ambulate Wound Treatment Wound #4 -  Foot Wound Laterality: Left, Lateral Cleanser: Soap and Water 1 x Per Day/30 Days Discharge Instructions: May shower and wash wound with dial antibacterial soap and water prior to dressing change. Prim Dressing: KerraCel Ag Gelling Fiber Dressing, 2x2 in (silver alginate) 1 x Per Day/30 Days ary Discharge Instructions: Apply silver alginate to wound bed as instructed Secondary Dressing: Woven Gauze Sponge, Non-Sterile 4x4 in 1 x Per Day/30 Days Discharge Instructions: Apply over primary dressing as directed. Secured With: [employer], 4.5x3.1 (in/yd) 1 x Per Day/30 Days Discharge Instructions: Secure with Kerlix as directed. Laboratory naerobe culture (MICRO) - Bone culture left lateral foot - (ICD10 L97.522 - Non-pressure chronic Bacteria identified in Unspecified specimen by A ulcer of other part of left foot with fat layer exposed) LOINC Code: 635-3 Convenience Name: Anerobic culture Bacteria identified in Tissue by Biopsy culture (MICRO) - Bone left lateral foot - (ICD10 L97.522 - Non-pressure chronic ulcer of other part of left foot with fat layer exposed) LOINC Code: 16109-6 Convenience Name: Biopsy specimen culture Radiology X-ray, left foot, complete view - Non healing ulcer on left lateral foot - (ICD10 E11.621 - Type 2 diabetes mellitus with foot ulcer) Electronic Signature(s) Signed: 10/08/2020 4:58:57 PM By: Baltazar Najjar MD Signed: 10/08/2020 5:46:53 PM By: Zandra Abts RN, BSN Entered By: Zandra Abts on 10/08/2020 12:50:44 Prescription 10/08/2020 -------------------------------------------------------------------------------- Clelia Schaumann MD Patient Name: Provider: 07/19/1966 [id number] Date of Birth: NPI#Jerral Ralph Sex: DEA #: [phone] [id number] Phone #: License #: Eligha Bridegroom [facility] Patient Address: [address] [place], [place] [address] [address] [place], [place] [address] [phone] Allergies ibuprofen; penicillin Provider's Orders X-ray, left foot, complete view - ICD10: E11.621 - Non healing ulcer on left lateral foot Hand Signature: Date(s): Electronic Signature(s) Signed: 10/08/2020 4:58:57 PM By: Baltazar Najjar MD Signed: 10/08/2020 5:46:53 PM By: Zandra Abts RN, BSN Entered By: Zandra Abts on 10/08/2020 12:50:45 -------------------------------------------------------------------------------- Problem List Details Patient Name: Date of Service: CALLEIGH, [name] 10/08/2020 9:30 A M Medical Record Number: [id number] Patient Account Number: [id number] Date of Birth/Sex: Treating RN: [birth year] (54 y.o. [name] Primary Care Provider: Elizabeth Palau Other Clinician: Referring Provider: Treating Provider/Extender: Tyson Babinski in Treatment: 3 Active Problems ICD-10 Encounter Code Description Active Date MDM Diagnosis E11.621 Type 2 diabetes mellitus with foot ulcer 09/15/2020 No Yes L97.524 Non-pressure chronic ulcer of other part of left foot with necrosis of bone 10/08/2020 No Yes L97.512 Non-pressure chronic ulcer of other part of right foot with fat layer exposed 09/15/2020 No Yes L97.812 Non-pressure chronic ulcer of other part of right lower leg with fat layer 09/15/2020 No Yes exposed M31.31 Wegener's granulomatosis with renal involvement 09/15/2020 No Yes N18.9 Chronic kidney disease, unspecified 09/15/2020 No Yes Inactive Problems Resolved Problems Electronic Signature(s) Signed: 10/08/2020 4:58:57 PM By: Baltazar Najjar MD Entered By: Baltazar Najjar on 10/08/2020 10:38:13 -------------------------------------------------------------------------------- Progress  Note Details Patient Name: Date of Service: [name], Marigene 10/08/2020 9:30 A M Medical Record Number: [id number] Patient Account Number: [id number] Date of Birth/Sex: Treating RN: 1966/02/09 (54 y.o. F) Primary Care Provider: Elizabeth Palau Other Clinician: Referring Provider: Treating Provider/Extender: Tyson Babinski in Treatment: 3 Subjective History of Present Illness (HPI) 09/15/2020 upon evaluation today patient appears to be doing somewhat poorly in regard to the wounds on her feet. She has a wound on the right lateral foot, right medial great toe, right dorsal great toe, left lateral foot, and right lateral  lower leg. Currently based on what we are seeing the patient states that the worst issue is her right great toe. She dates that she wishes Dr. Victorino DikeHewitt had actually gone ahead and performed amputation at this site as well when he did the other toes they have all healed well that is the right third toe, left great toe, and left third toe. All are completely healed and did excellent. Currently she has been transition into Cam boots for both feet previously she was using postop shoes but apparently this caused some rubbing on the lateral aspects of both feet and at the medial aspect of her right great toe which unfortunately has caused her a lot of discomfort and issues. She has been given mupirocin and betamethasone for some of the small wounds that occur over her legs and arms frequently. 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 Otherwise she also does have diabetes mellitus type 2. Currently the patient is on methotrexate and also undergoes chemotherapy infusions every 6 months for the Wegener's granulomatosis currently her amputations were asked on August 12, 2020 that was with Dr. Victorino DikeHewitt. His physician assistant Jill AlexandersJustin referred her to me  today. 09/22/2020 upon evaluation today patient appears to be making good progress with the Santyl at this point. I am extremely pleased with where things stand today and I do believe him to be able to debride some of the left lateral foot wound which we have not been able to do as the eschar was too thick. I think this is doing much better currently. 09/29/2020 upon evaluation today patient appears to be doing decently well in regard to her ulcers on her feet. I do believe the Santyl has been of benefit. With that being said I am concerned about the fact that she continues to have wounds over her arms and legs bilaterally which seem to continually be an issue. Fortunately there is no signs of active infection at this time but again I am not really sure where these are coming from. She is seeing dermatology they had suggested this was pyoderma but that seems kind of unusual based on how they seem to come up. She has been using a mix of steroid and mupirocin over these areas. 12/17; this is a patient called in urgently to be seen. She was apparently in the shower washing the wound on the left lateral fifth metatarsal head and noticed a hole present. She also complained of increasing pain.No systemic signs or symptoms. 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 Pulse regular and within target range for patient.Marland Kitchen. Respirations regular, non-labored and within target range.. Temperature is normal and within the target range for the patient.Marland Kitchen. Appears in no distress. Vitals Time Taken: 9:51 AM, Height: 66 in, Weight: 195 lbs, BMI: 31.5, Temperature: 98.4 F, Pulse: 78 bpm, Respiratory Rate: 18 breaths/min, Blood Pressure: 104/70  mmHg. Cardiovascular Pulses are palpable on the left. General Notes: Wound exam; the area she is concerned about is on the left lateral fifth metatarsal head. In the middle of this wound there is a substantial opening that goes right down to necrotic bone. There  is no purulent drainage however this has deep undermining. I used rongeurs to obtain a piece of bone for CandS and pathology. There is no erythema around the wound no palpable tenderness Integumentary (Hair, Skin) Wound #4 status is Open. Original cause of wound was Shear/Friction. The wound is located on the Left,Lateral Foot. The wound measures 3cm length x 2.5cm width x 1.6cm depth; 5.89cm^2 area and 9.425cm^3 volume. There is bone and Fat Layer (Subcutaneous Tissue) exposed. There is no tunneling or undermining noted. There is a medium amount of serosanguineous drainage noted. The wound margin is flat and intact. There is small (1-33%) pink granulation within the wound bed. There is a large (67-100%) amount of necrotic tissue within the wound bed including Adherent Slough. 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 There was a Excisional Skin/Subcutaneous Tissue/Muscle/Bone Debridement with a total area of 1 sq cm performed by Maxwell Caul., MD. With the following instrument(s): Rongeur to remove Non-Viable tissue/material. Material removed includes Bone after achieving  pain control using Lidocaine Injectable: 1%. 1 specimen was taken by a Tissue Culture and sent to the lab per facility protocol. A time out was conducted at 10:35, prior to the start of the procedure. A Moderate amount of bleeding was controlled with Silver Nitrate. The procedure was tolerated well with a pain level of 0 throughout and a pain level of 0 following the procedure. Post Debridement Measurements: 3cm length x 2.5cm width x 1.6cm depth; 9.425cm^3 volume. Character of Wound/Ulcer Post Debridement is stable. Severity of Tissue Post Debridement is: Bone involvement without necrosis. Post procedure Diagnosis Wound #4: Same as Pre-Procedure Plan Follow-up Appointments: Return Appointment in 1 week. - Keep appt for 12/22 with [facility] Shower/ Hygiene: May shower and wash wound with soap and water. - shower with hibiclens soap weekly Off-Loading: Removable cast walker boot to: - Cam boot both feet to ambulate Laboratory ordered were: Anerobic culture - Bone culture left lateral foot, Biopsy specimen culture - Bone left lateral foot Radiology ordered were: X-ray, left foot, complete view - Non healing ulcer on left lateral foot WOUND #4: - Foot Wound Laterality: Left, Lateral Cleanser: Soap and Water 1 x Per Day/30 Days Discharge Instructions: May shower and wash wound with dial antibacterial soap and water prior to dressing change. Prim Dressing: KerraCel Ag Gelling Fiber Dressing, 2x2 in (silver alginate) 1 x Per Day/30 Days ary Discharge Instructions: Apply silver alginate to wound bed as instructed Secondary Dressing: Woven Gauze Sponge, Non-Sterile 4x4 in 1 x Per Day/30 Days Discharge Instructions: Apply over primary dressing as directed. Secured With: [employer], 4.5x3.1 (in/yd) 1 x Per Day/30 Days Discharge Instructions: Secure with Kerlix as directed. 1. Left lateral foot specimens of bone obtained for culture and pathology no empiric antibiotics for now 2. I have  ordered a MRI without contrast on the left foot 3. This is a very worrisome wound I am not quite sure why this deteriorated so much but underlying infection has to be in the top of  this list here. 4. This will put her at risk of further surgery although I did not discuss that with her today. 5. I have asked her to keep off this area 6. Were going to put silver alginate packed into the open hole and over the surface of the wound on this side. The area on the right plantar toe I only glance that but it looks quite healthy I think she is using Santyl here 7. 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 She is on immunosuppressive's including prednisone 20 methotrexate. Electronic Signature(s) Signed: 10/08/2020 4:58:57 PM By: Baltazar Najjar MD Signed: 10/08/2020 5:46:53 PM By: Zandra Abts RN, BSN Entered By: Zandra Abts on 10/08/2020 12:52:22 -------------------------------------------------------------------------------- SuperBill Details Patient Name: Date of Service: CEAIRA, ERNSTER 10/08/2020 Medical Record Number: [id number] Patient Account Number: [id number] Date of Birth/Sex: Treating RN: 03-Jan-1966 (54 y.o. F) Primary Care Provider: Elizabeth Palau Other Clinician: Referring Provider: Treating Provider/Extender: Tyson Babinski in Treatment: 3 Diagnosis Coding ICD-10 Codes Code Description [phone] Type 2 diabetes mellitus with foot ulcer L97.524 Non-pressure chronic ulcer of other part of left foot with necrosis of bone L97.512 Non-pressure chronic ulcer of other part of right foot with fat layer exposed L97.812 Non-pressure chronic ulcer of other part of right lower leg with fat layer exposed M31.31 Wegener's granulomatosis with renal involvement N18.9 Chronic kidney disease, unspecified Facility Procedures CPT4 Code: [id number] Description: 11044 - DEB BONE 20 SQ CM/< ICD-10 Diagnosis Description L97.524 Non-pressure  chronic ulcer of other part of left foot with necrosis of bo Modifier: ne Quantity: 1 Physician Procedures CPT4: Description Modifier Code [id number] Debridement; bone (includes epidermis, dermis, subQ tissue, muscle and/or fascia, if performed) 1st 20 sqcm or less ICD-10 Diagnosis Description L97.524 Non-pressure chronic ulcer of other part of left foot with  necrosis of bone Quantity: 1 Electronic Signature(s) Signed: 10/08/2020 4:58:57 PM By: Baltazar Najjar MD Entered By: Baltazar Najjar on 10/08/2020 10:45:45

## 2020-10-12 ENCOUNTER — Other Ambulatory Visit (HOSPITAL_COMMUNITY): Payer: Self-pay | Admitting: Orthopedic Surgery

## 2020-10-13 ENCOUNTER — Other Ambulatory Visit: Payer: Self-pay

## 2020-10-13 ENCOUNTER — Encounter (HOSPITAL_BASED_OUTPATIENT_CLINIC_OR_DEPARTMENT_OTHER): Payer: BC Managed Care – PPO | Admitting: Physician Assistant

## 2020-10-13 ENCOUNTER — Encounter (HOSPITAL_BASED_OUTPATIENT_CLINIC_OR_DEPARTMENT_OTHER): Payer: Self-pay | Admitting: Orthopedic Surgery

## 2020-10-13 DIAGNOSIS — E11621 Type 2 diabetes mellitus with foot ulcer: Secondary | ICD-10-CM | POA: Diagnosis not present

## 2020-10-13 NOTE — Progress Notes (Signed)
[name] ([id number]) Visit Report for 10/13/2020 Arrival Information Details Patient Name: Date of Service: [name], [name] 10/13/2020 2:45 PM Medical Record Number: [id number] Patient Account Number: [id number] Date of Birth/Sex: Treating RN: [birth year] (54 y.o. [name], [name] Primary Care Vania Rosero: Elizabeth Palau Other Clinician: Referring Sneha Willig: Treating Felix Pratt/Extender: Duanne Limerick in Treatment: 4 Visit Information History Since Last Visit Added or deleted any medications: No Patient Arrived: Wheel Chair Any new allergies or adverse reactions: No Arrival Time: 15:29 Had a fall or experienced change in No Accompanied By: dad activities of daily living that may affect Transfer Assistance: None risk of falls: Patient Identification Verified: Yes Signs or symptoms of abuse/neglect since last visito No Secondary Verification Process Completed: Yes Hospitalized since last visit: No Patient Requires Transmission-Based Precautions: No Implantable device outside of the clinic excluding No Patient Has Alerts: No cellular tissue based products placed in the center since last visit: Has Dressing in Place as Prescribed: Yes Pain Present Now: Yes Electronic Signature(s) Signed: 10/13/2020 5:16:00 PM By: Fonnie Mu RN Entered By: Fonnie Mu on 10/13/2020 15:30:18 -------------------------------------------------------------------------------- Clinic Level of Care Assessment Details Patient Name: Date of Service: [name] 10/13/2020 2:45 PM Medical Record Number: [id number] Patient Account Number: [id number] Date of Birth/Sex: Treating RN: 09-07-1966 (54 y.o. [name] Primary Care Marquisa Salih: Elizabeth Palau Other Clinician: Referring Carleah Yablonski: Treating Lindi Abram/Extender: Duanne Limerick in Treatment: 4 Clinic Level of Care Assessment Items TOOL 4 Quantity Score X- 1 0 Use when only an  EandM is performed on FOLLOW-UP visit ASSESSMENTS - Nursing Assessment / Reassessment X- 1 10 Reassessment of Co-morbidities (includes updates in patient status) X- 1 5 Reassessment of Adherence to Treatment Plan ASSESSMENTS - Wound and Skin A ssessment / Reassessment  - 0 Simple Wound Assessment / Reassessment - one wound X- 7 5 ComplBERENICE, OEHLERTAssessment / Reassessment - multiple wounds  - 0 Dermatologic / Skin Assessment (not related to wound area) ASSESSMENTS - Focused Assessment  - 0 Circumferential Edema Measurements - multi extremities  - 0 Nutritional Assessment / Counseling / Intervention X- 1 5 Lower Extremity Assessment (monofilament, tuning fork, pulses)  - 0 Peripheral Arterial Disease Assessment (using hand held doppler) ASSESSMENTS - Ostomy and/or Continence Assessment and Care  - 0 Incontinence Assessment and Management  - 0 Ostomy Care Assessment and Management (repouching, etc.) PROCESS - Coordination of Care X - Simple Patient / Family Education for ongoing care 1 15  - 0 Complex (extensive) Patient / Family Education for ongoing care X- 1 10 Staff obtains Chiropractor, Records, T Results / Process Orders est  - 0 Staff telephones HHA, Nursing Homes / Clarify orders / etc  - 0 Routine Transfer to another Facility (non-emergent condition)  - 0 Routine Hospital Admission (non-emergent condition)  - 0 New Admissions / Manufacturing engineer / Ordering NPWT Apligraf, etc. ,  - 0 Emergency Hospital Admission (emergent condition) X- 1 10 Simple Discharge Coordination  - 0 Complex (extensive) Discharge Coordination PROCESS - Special Needs  - 0 Pediatric / Minor Patient Management  - 0 Isolation Patient Management  - 0 Hearing / Language / Visual special needs  - 0 Assessment of Community assistance (transportation, D/C planning, etc.)  - 0 Additional assistance / Altered mentation  - 0 Support Surface(s)  Assessment (bed, cushion, seat, etc.) INTERVENTIONS - Wound Cleansing / Measurement  - 0 Simple Wound Cleansing - one wound X- 7 5 Complex Wound Cleansing - multiple wounds X- 1 5  Wound Imaging (photographs - any number of wounds) []  - 0 Wound Tracing (instead of photographs) []  - 0 Simple Wound Measurement - one wound X- 7 5 Complex Wound Measurement - multiple wounds INTERVENTIONS - Wound Dressings []  - 0 Small Wound Dressing one or multiple wounds []  - 0 Medium Wound Dressing one or multiple wounds X- 1 20 Large Wound Dressing one or multiple wounds []  - 0 Application of Medications - topical []  - 0 Application of Medications - injection INTERVENTIONS - Miscellaneous []  - 0 External ear exam []  - 0 Specimen Collection (cultures, biopsies, blood, body fluids, etc.) []  - 0 Specimen(s) / Culture(s) sent or taken to Lab for analysis []  - 0 Patient Transfer (multiple staff / / Similar devices) []  - 0 Simple Staple / Suture removal (25 or less) []  - 0 Complex Staple / Suture removal (26 or more) []  - 0 Hypo / Hyperglycemic Management (close monitor of Blood Glucose) []  - 0 Ankle / Brachial Index (ABI) - do not check if billed separately X- 1 5 Vital Signs Has the patient been seen at the hospital within the last three years: Yes Total Score: 190 Level Of Care: New/Established - Level 5 Electronic Signature(s) Signed: 10/13/2020 7:00:57 PM By: RN, BSN Entered By: on 10/13/2020 17:56:35 -------------------------------------------------------------------------------- Encounter Discharge Information Details Patient Name: Date of Service: [name] 10/13/2020 2:45 PM Medical Record Number: Patient Account Number: Date of Birth/Sex: Treating RN: 1966-01-26 (54 y.o. Primary Care Shandricka Monroy: Other Clinician: Referring Gesselle Fitzsimons: Treating Bryce Cheever/Extender: in Treatment: 4 Encounter Discharge Information Items Discharge Condition: Stable Ambulatory Status: Wheelchair Discharge Destination: Home Transportation: Private Auto Accompanied By: family member Schedule Follow-up Appointment: Yes Clinical Summary of Care: Patient Declined Electronic Signature(s) Signed: 10/13/2020 5:14:06 PM By: 10/15/2020 RN Entered By: Zandra Abts on 10/13/2020 16:59:46 -------------------------------------------------------------------------------- Lower Extremity Assessment Details Patient Name: Date of Service: BETTINA, WARN 10/13/2020 2:45 PM Medical Record Number: 10/15/2020 Patient Account Number: [id number] Date of Birth/Sex: Treating RN: 30-Jun-1966 (54 y.o. 57, [name] Primary Care Analiza Cowger: Freddy Finner Other Clinician: Referring Rudi Bunyard: Treating Cale Bethard/Extender: Elizabeth Palau in Treatment: 4 Edema Assessment Assessed: Duanne Limerick: Yes] 10/15/2020: Yes] Edema: [Left: No] [Right: No] Calf Left: Right: Point of Measurement: 30 cm From Medial Instep 40 cm 37.4 cm Ankle Left: Right: Point of Measurement: 9 cm From Medial Instep 23 cm 23 cm Knee To Floor Left: Right: From Medial Instep 41 cm 41 cm Vascular Assessment Pulses: Dorsalis Pedis Palpable: [Left:Yes] [Right:Yes] Posterior Tibial Palpable: [Left:Yes] [Right:Yes] Electronic Signature(s) Signed: 10/13/2020 5:16:00 PM By: Yevonne Pax RN Entered By: 10/15/2020 on 10/13/2020 15:36:17 -------------------------------------------------------------------------------- Multi-Disciplinary Care Plan Details Patient Name: Date of Service: PRESLEA, RHODUS 10/13/2020 2:45 PM Medical Record Number: [id number] Patient Account Number: 12/20/1965 Date of Birth/Sex: Treating RN: 05/27/1966 (54 y.o. Elizabeth Palau Primary Care Raihan Kimmel: Duanne Limerick Other Clinician: Referring Manasseh Pittsley: Treating Demeshia Sherburne/Extender:  Kyra Searles in Treatment: 4 Active Inactive Wound/Skin Impairment Nursing Diagnoses: Impaired tissue integrity Knowledge deficit related to ulceration/compromised skin integrity Goals: Patient/caregiver will verbalize understanding of skin care regimen Date Initiated: 09/15/2020 Target Resolution Date: 11/12/2020 Goal Status: Active Ulcer/skin breakdown will have a volume reduction of 30% by week 4 Date Initiated: 09/15/2020 Date Inactivated: 10/13/2020 Target Resolution Date: 10/13/2020 Goal Status: Unmet Unmet Reason: infection Interventions: Assess patient/caregiver ability to obtain necessary supplies Assess patient/caregiver ability to perform  ulcer/skin care regimen upon admission and as needed Assess ulceration(s) every visit Provide education on ulcer and skin care Treatment Activities: Skin care regimen initiated : 09/15/2020 Topical wound management initiated : 09/15/2020 Notes: Electronic Signature(s) Signed: 10/13/2020 7:00:57 PM By: Zandra Abts RN, BSN Entered By: Zandra Abts on 10/13/2020 17:55:55 -------------------------------------------------------------------------------- Pain Assessment Details Patient Name: Date of Service: EOLA, WALDREP 10/13/2020 2:45 PM Medical Record Number: [id number] Patient Account Number: [id number] Date of Birth/Sex: Treating RN: February 14, 1966 (54 y.o. [name], [name] Primary Care Johnasia Liese: Elizabeth Palau Other Clinician: Referring Grabiela Wohlford: Treating Bethann Qualley/Extender: Duanne Limerick in Treatment: 4 Active Problems Location of Pain Severity and Description of Pain Patient Has Paino Yes Site Locations Pain Location: Pain in Ulcers With Dressing Change: Yes Duration of the Pain. Constant / Intermittento Intermittent Rate the pain. Current Pain Level: 7 Worst Pain Level: 10 Least Pain Level: 0 Tolerable Pain Level: 7 Character of Pain Describe the Pain:  Aching Pain Management and Medication Current Pain Management: Medication: Yes Cold Application: No Rest: Yes Massage: No Activity: No T.E.N.S.: No Heat Application: No Leg drop or elevation: No Is the Current Pain Management Adequate: Adequate How does your wound impact your activities of daily livingo Sleep: No Bathing: No Appetite: No Relationship With Others: No Bladder Continence: No Emotions: No Bowel Continence: No Work: No Toileting: No Drive: No Dressing: No Hobbies: No Electronic Signature(s) Signed: 10/13/2020 5:16:00 PM By: Fonnie Mu RN Entered By: Fonnie Mu on 10/13/2020 15:31:35 -------------------------------------------------------------------------------- Patient/Caregiver Education Details Patient Name: Date of Service: [name] 12/22/2021andnbsp2:45 PM Medical Record Number: [id number] Patient Account Number: [id number] Date of Birth/Gender: Treating RN: [birth year] (54 y.o. [name] Primary Care Physician: Elizabeth Palau Other Clinician: Referring Physician: Treating Physician/Extender: Duanne Limerick in Treatment: 4 Education Assessment Education Provided To: Patient Education Topics Provided Wound/Skin Impairment: Methods: Explain/Verbal Responses: State content correctly Electronic Signature(s) Signed: 10/13/2020 7:00:57 PM By: Zandra Abts RN, BSN Entered By: Zandra Abts on 10/13/2020 17:56:04 -------------------------------------------------------------------------------- Wound Assessment Details Patient Name: Date of Service: CAGNEY, STEENSON 10/13/2020 2:45 PM Medical Record Number: [id number] Patient Account Number: [id number] Date of Birth/Sex: Treating RN: 07-Apr-1966 (54 y.o. [name], [name] Primary Care Adra Shepler: Elizabeth Palau Other Clinician: Referring Cornelious Bartolucci: Treating Daisuke Bailey/Extender: Duanne Limerick in Treatment: 4 Wound  Status Wound Number: 1 Primary Diabetic Wound/Ulcer of the Lower Extremity Etiology: Wound Location: Right, Lateral Foot Wound Open Wounding Event: Shear/Friction Status: Date Acquired: 08/23/2020 Comorbid Anemia, Sleep Apnea, Hypotension, Type II Diabetes, Weeks Of Treatment: 4 History: Osteoarthritis, Neuropathy, Received Chemotherapy Clustered Wound: No Wound Measurements Length: (cm) 1 Width: (cm) 0.4 Depth: (cm) 0.1 Area: (cm) 0.314 Volume: (cm) 0.031 % Reduction in Area: 92% % Reduction in Volume: 92.1% Epithelialization: Medium (34-66%) Tunneling: No Undermining: No Wound Description Classification: Grade 2 Wound Margin: Flat and Intact Exudate Amount: Medium Exudate Type: Serosanguineous Exudate Color: red, brown Foul Odor After Cleansing: No Slough/Fibrino Yes Wound Bed Granulation Amount: Large (67-100%) Exposed Structure Granulation Quality: Red Fascia Exposed: No Necrotic Amount: Small (1-33%) Fat Layer (Subcutaneous Tissue) Exposed: Yes Necrotic Quality: Adherent Slough Tendon Exposed: No Muscle Exposed: No Joint Exposed: No Bone Exposed: No Treatment Notes Wound #1 (Foot) Wound Laterality: Right, Lateral Cleanser Soap and Water Discharge Instruction: May shower and wash wound with dial antibacterial soap and water prior to dressing change. Peri-Wound Care Topical Primary Dressing KerraCel Ag Gelling Fiber Dressing, 2x2 in (silver alginate) Discharge Instruction: Apply silver alginate to wound bed as  instructed Secondary Dressing Woven Gauze Sponge, Non-Sterile 4x4 in Discharge Instruction: Apply over primary dressing as directed. Secured With [employer], 4.5x3.1 (in/yd) Discharge Instruction: Secure with Kerlix as directed. Paper Tape, 2x10 (in/yd) Discharge Instruction: Secure dressing with tape as directed. Compression Wrap Compression Stockings Add-Ons Electronic Signature(s) Signed: 10/13/2020 5:16:00 PM By: Fonnie Mu  RN Entered By: Fonnie Mu on 10/13/2020 15:55:24 -------------------------------------------------------------------------------- Wound Assessment Details Patient Name: Date of Service: FATIM, VANDERSCHAAF 10/13/2020 2:45 PM Medical Record Number: [id number] Patient Account Number: [id number] Date of Birth/Sex: Treating RN: Jan 26, 1966 (54 y.o. [name], [name] Primary Care Deklin Bieler: Elizabeth Palau Other Clinician: Referring Bruna Dills: Treating Gladies Sofranko/Extender: Duanne Limerick in Treatment: 4 Wound Status Wound Number: 2 Primary Diabetic Wound/Ulcer of the Lower Extremity Etiology: Wound Location: Right, Plantar T Great oe Wound Open Wounding Event: Shear/Friction Status: Date Acquired: 08/23/2020 Comorbid Anemia, Sleep Apnea, Hypotension, Type II Diabetes, Weeks Of Treatment: 4 History: Osteoarthritis, Neuropathy, Received Chemotherapy Clustered Wound: No Wound Measurements Length: (cm) 0.8 Width: (cm) 1.6 Depth: (cm) 0.1 Area: (cm) 1.005 Volume: (cm) 0.101 % Reduction in Area: 67.3% % Reduction in Volume: 67.1% Epithelialization: Small (1-33%) Tunneling: No Undermining: No Wound Description Classification: Grade 2 Wound Margin: Flat and Intact Exudate Amount: Medium Exudate Type: Serosanguineous Exudate Color: red, brown Foul Odor After Cleansing: No Slough/Fibrino Yes Wound Bed Granulation Amount: Large (67-100%) Exposed Structure Granulation Quality: Pink, Pale Fascia Exposed: No Necrotic Amount: Small (1-33%) Fat Layer (Subcutaneous Tissue) Exposed: Yes Necrotic Quality: Adherent Slough Tendon Exposed: No Muscle Exposed: No Joint Exposed: No Bone Exposed: No Treatment Notes Wound #2 (Toe Great) Wound Laterality: Plantar, Right Cleanser Soap and Water Discharge Instruction: May shower and wash wound with dial antibacterial soap and water prior to dressing change. Peri-Wound Care Topical Primary Dressing KerraCel Ag  Gelling Fiber Dressing, 2x2 in (silver alginate) Discharge Instruction: Apply silver alginate to wound bed as instructed Secondary Dressing Woven Gauze Sponges 2x2 in Discharge Instruction: Apply over primary dressing as directed. Secured With Conforming Stretch Gauze Bandage, Sterile 2x75 (in/in) Discharge Instruction: Secure with stretch gauze as directed. Compression Wrap Compression Stockings Add-Ons Electronic Signature(s) Signed: 10/13/2020 5:16:00 PM By: Fonnie Mu RN Entered By: Fonnie Mu on 10/13/2020 15:56:55 -------------------------------------------------------------------------------- Wound Assessment Details Patient Name: Date of Service: LAIKLYNN, RACZYNSKI 10/13/2020 2:45 PM Medical Record Number: [id number] Patient Account Number: [id number] Date of Birth/Sex: Treating RN: 1966/04/10 (54 y.o. [name], [name] Primary Care Denelda Akerley: Elizabeth Palau Other Clinician: Referring Kendall Arnell: Treating Tachina Spoonemore/Extender: Duanne Limerick in Treatment: 4 Wound Status Wound Number: 3 Primary Diabetic Wound/Ulcer of the Lower Extremity Etiology: Wound Location: Right T Great oe Wound Open Wounding Event: Trauma Status: Date Acquired: 10/23/2017 Comorbid Anemia, Sleep Apnea, Hypotension, Type II Diabetes, Weeks Of Treatment: 4 History: Osteoarthritis, Neuropathy, Received Chemotherapy Clustered Wound: No Wound Measurements Length: (cm) 0.6 Width: (cm) 4.5 Depth: (cm) 0.3 Area: (cm) 2.121 Volume: (cm) 0.636 % Reduction in Area: 66.9% % Reduction in Volume: 66.9% Epithelialization: None Tunneling: No Undermining: No Wound Description Classification: Grade 2 Wound Margin: Epibole Exudate Amount: Medium Exudate Type: Serosanguineous Exudate Color: red, brown Foul Odor After Cleansing: No Slough/Fibrino Yes Wound Bed Granulation Amount: Large (67-100%) Exposed Structure Granulation Quality: Pink Fascia Exposed:  No Necrotic Amount: Small (1-33%) Fat Layer (Subcutaneous Tissue) Exposed: Yes Necrotic Quality: Adherent Slough Tendon Exposed: No Muscle Exposed: No Joint Exposed: No Bone Exposed: No Treatment Notes Wound #3 (Toe Great) Wound Laterality: Right Cleanser Soap and Water Discharge Instruction: May shower and  wash wound with dial antibacterial soap and water prior to dressing change. Peri-Wound Care Topical Primary Dressing KerraCel Ag Gelling Fiber Dressing, 2x2 in (silver alginate) Discharge Instruction: Apply silver alginate to wound bed as instructed Secondary Dressing Woven Gauze Sponges 2x2 in Discharge Instruction: Apply over primary dressing as directed. Secured With Conforming Stretch Gauze Bandage, Sterile 2x75 (in/in) Discharge Instruction: Secure with stretch gauze as directed. Compression Wrap Compression Stockings Add-Ons Electronic Signature(s) Signed: 10/13/2020 5:16:00 PM By: Fonnie Mu RN Entered By: Fonnie Mu on 10/13/2020 15:53:39 -------------------------------------------------------------------------------- Wound Assessment Details Patient Name: Date of Service: CLOA, BUSHONG 10/13/2020 2:45 PM Medical Record Number: [id number] Patient Account Number: [id number] Date of Birth/Sex: Treating RN: 1966-10-16 (54 y.o. [name], [name] Primary Care Cathi Hazan: Elizabeth Palau Other Clinician: Referring Lunden Stieber: Treating Randell Teare/Extender: Duanne Limerick in Treatment: 4 Wound Status Wound Number: 4 Primary Diabetic Wound/Ulcer of the Lower Extremity Etiology: Wound Location: Left, Lateral Foot Wound Open Wounding Event: Shear/Friction Status: Date Acquired: 08/23/2020 Comorbid Anemia, Sleep Apnea, Hypotension, Type II Diabetes, Weeks Of Treatment: 4 History: Osteoarthritis, Neuropathy, Received Chemotherapy Clustered Wound: No Wound Measurements Length: (cm) 5 Width: (cm) 4.3 Depth: (cm) 1.6 Area: (cm)  16.886 Volume: (cm) 27.018 % Reduction in Area: -258.4% % Reduction in Volume: -5636.3% Epithelialization: None Tunneling: Yes Position (o'clock): 10 Maximum Distance: (cm) 1.1 Undermining: No Wound Description Classification: Grade 3 Wagner Verification: X-Ray Wound Margin: Flat and Intact Exudate Amount: Large Exudate Type: Serosanguineous Exudate Color: red, brown Foul Odor After Cleansing: No Slough/Fibrino Yes Wound Bed Granulation Amount: Small (1-33%) Exposed Structure Granulation Quality: Pink Fascia Exposed: No Necrotic Amount: Large (67-100%) Fat Layer (Subcutaneous Tissue) Exposed: Yes Necrotic Quality: Adherent Slough Tendon Exposed: No Muscle Exposed: No Joint Exposed: No Bone Exposed: Yes Treatment Notes Wound #4 (Foot) Wound Laterality: Left, Lateral Cleanser Soap and Water Discharge Instruction: May shower and wash wound with dial antibacterial soap and water prior to dressing change. Peri-Wound Care Topical Primary Dressing KerraCel Ag Gelling Fiber Dressing, 4x5 in (silver alginate) Discharge Instruction: Apply silver alginate to wound bed as instructed Secondary Dressing Woven Gauze Sponge, Non-Sterile 4x4 in Discharge Instruction: Apply over primary dressing as directed. Secured With [employer], 4.5x3.1 (in/yd) Discharge Instruction: Secure with Kerlix as directed. Paper Tape, 2x10 (in/yd) Discharge Instruction: Secure dressing with tape as directed. Compression Wrap Compression Stockings Add-Ons Electronic Signature(s) Signed: 10/13/2020 5:16:00 PM By: Fonnie Mu RN Entered By: Fonnie Mu on 10/13/2020 15:51:54 -------------------------------------------------------------------------------- Wound Assessment Details Patient Name: Date of Service: MALISSIE, MUSGRAVE 10/13/2020 2:45 PM Medical Record Number: [id number] Patient Account Number: [id number] Date of Birth/Sex: Treating RN: 1966-08-26 (54 y.o. [name],  [name] Primary Care Leafy Motsinger: Elizabeth Palau Other Clinician: Referring Brytani Voth: Treating Tywan Siever/Extender: Duanne Limerick in Treatment: 4 Wound Status Wound Number: 5 Primary Lesion Etiology: Wound Location: Right, Lateral Lower Leg Wound Healed - Epithelialized Wounding Event: Gradually Appeared Status: Date Acquired: 08/23/2020 Date Acquired: 08/23/2020 Comorbid Anemia, Sleep Apnea, Hypotension, Type II Diabetes, Weeks Of Treatment: 4 History: Osteoarthritis, Neuropathy, Received Chemotherapy Clustered Wound: No Wound Measurements Length: (cm) Width: (cm) Depth: (cm) Area: (cm) Volume: (cm) 0 % Reduction in Area: 100% 0 % Reduction in Volume: 100% 0 Epithelialization: Large (67-100%) 0 Tunneling: No 0 Undermining: No Wound Description Classification: Full Thickness Without Exposed Support Structures Wound Margin: Flat and Intact Exudate Amount: None Present Foul Odor After Cleansing: No Slough/Fibrino No Wound Bed Granulation Amount: None Present (0%) Exposed Structure Necrotic Amount: None Present (0%) Fascia Exposed: No  Fat Layer (Subcutaneous Tissue) Exposed: No Tendon Exposed: No Muscle Exposed: No Joint Exposed: No Bone Exposed: No Electronic Signature(s) Signed: 10/13/2020 5:16:00 PM By: Fonnie MuBreedlove, [name] RN Signed: 10/13/2020 7:00:57 PM By: Zandra AbtsLynch, Shatara RN, BSN Entered By: Zandra AbtsLynch, Shatara on 10/13/2020 16:30:20 -------------------------------------------------------------------------------- Wound Assessment Details Patient Name: Date of Service: Glendon AxeHA TLEY, Evellyn 10/13/2020 2:45 PM Medical Record Number: [id number] Patient Account Number: [id number] Date of Birth/Sex: Treating RN: 1965/12/22 (54 y.o. Ardis RowanF) Breedlove, [name] Primary Care Anayely Constantine: Elizabeth PalauAnderson, [name] Other Clinician: Referring Luvia Orzechowski: Treating Dorcas Melito/Extender: Duanne LimerickStone III, Hoyt Anderson, [name] [name]: 4 Wound Status Wound Number: 6 Primary  Etiology: Lesion Wound Location: Left, Lateral Lower Leg Wound Status: Open Wounding Event: Gradually Appeared Date Acquired: 09/24/2020 Weeks Of Treatment: 2 Clustered Wound: No Wound Measurements Length: (cm) 0.1 Width: (cm) 0.1 Depth: (cm) 0.1 Area: (cm) 0.008 Volume: (cm) 0.001 % Reduction in Area: 99.3% % Reduction in Volume: 99.1% Wound Description Classification: Partial Thickness Treatment Notes Wound #6 (Lower Leg) Wound Laterality: Left, Lateral Cleanser Soap and Water Discharge Instruction: May shower and wash wound with dial antibacterial soap and water prior to dressing change. Peri-Wound Care Topical Mupirocin Ointment Discharge Instruction: Apply Mupirocin (Bactroban) as instructed Clobetasol Cream Discharge Instruction: mixed with mupirocin Primary Dressing Secondary Dressing Secured With Compression Wrap Compression Stockings Add-Ons Electronic Signature(s) Signed: 10/13/2020 5:16:00 PM By: Fonnie MuBreedlove, [name] RN Entered By: Fonnie MuBreedlove, [name] on 10/13/2020 15:50:20 -------------------------------------------------------------------------------- Wound Assessment Details Patient Name: Date of Service: Glendon AxeHA TLEY, Keonia 10/13/2020 2:45 PM Medical Record Number: [id number] Patient Account Number: [id number] Date of Birth/Sex: Treating RN: 1965/12/22 (54 y.o. Ardis RowanF) Breedlove, [name] Primary Care Vicenta Olds: Elizabeth PalauAnderson, [name] Other Clinician: Referring Nola Botkins: Treating Sherrika Weakland/Extender: Duanne LimerickStone III, Hoyt Anderson, [name] [name]: 4 Wound Status Wound Number: 7 Primary Etiology: Lesion Wound Location: Left, Proximal Lower Leg Wound Status: Open Wounding Event: Gradually Appeared Date Acquired: 09/24/2020 Weeks Of Treatment: 2 Clustered Wound: No Wound Measurements Length: (cm) 0.1 Width: (cm) 0.1 Depth: (cm) 0.1 Area: (cm) 0.008 Volume: (cm) 0.001 % Reduction in Area: 99% % Reduction in Volume: 98.7% Wound Description Classification:  Partial Thickness Treatment Notes Wound #7 (Lower Leg) Wound Laterality: Left, Proximal Cleanser Soap and Water Discharge Instruction: May shower and wash wound with dial antibacterial soap and water prior to dressing change. Peri-Wound Care Topical Mupirocin Ointment Discharge Instruction: Apply Mupirocin (Bactroban) as instructed Clobetasol Cream Discharge Instruction: mixed with mupirocin Primary Dressing Secondary Dressing Secured With Compression Wrap Compression Stockings Add-Ons Electronic Signature(s) Signed: 10/13/2020 5:16:00 PM By: Fonnie MuBreedlove, [name] RN Entered By: Fonnie MuBreedlove, [name] on 10/13/2020 15:50:20 -------------------------------------------------------------------------------- Wound Assessment Details Patient Name: Date of Service: Glendon AxeHA TLEY, Katey 10/13/2020 2:45 PM Medical Record Number: [id number] Patient Account Number: [id number] Date of Birth/Sex: Treating RN: 1965/12/22 (54 y.o. Ardis RowanF) Breedlove, [name] Primary Care Koua Deeg: Elizabeth PalauAnderson, [name] Other Clinician: Referring Nile Dorning: Treating Raji Glinski/Extender: Duanne LimerickStone III, Hoyt Anderson, [name] [name]: 4 Wound Status Wound Number: 8 Primary Lesion Etiology: Wound Location: Right Forearm Wound Healed - Epithelialized Wounding Event: Gradually Appeared Status: Date Acquired: 09/24/2020 Comorbid Anemia, Sleep Apnea, Hypotension, Type II Diabetes, Weeks Of Treatment: 2 History: Osteoarthritis, Neuropathy, Received Chemotherapy Clustered Wound: No Wound Measurements Length: (cm) Width: (cm) Depth: (cm) Area: (cm) Volume: (cm) 0 % Reduction in Area: 100% 0 % Reduction in Volume: 100% 0 Epithelialization: Large (67-100%) 0 Tunneling: No 0 Undermining: No Wound Description Classification: Partial Thickness Wound Margin: Distinct, outline attached Exudate Amount: None Present Foul Odor After Cleansing: No Slough/Fibrino No Wound Bed Granulation Amount: None Present (0%) Exposed  Structure Necrotic  Amount: None Present (0%) Fascia Exposed: No Fat Layer (Subcutaneous Tissue) Exposed: No Tendon Exposed: No Muscle Exposed: No Joint Exposed: No Bone Exposed: No Electronic Signature(s) Signed: 10/13/2020 5:16:00 PM By: Fonnie Mu RN Signed: 10/13/2020 7:00:57 PM By: Zandra Abts RN, BSN Entered By: Zandra Abts on 10/13/2020 16:33:07 -------------------------------------------------------------------------------- Wound Assessment Details Patient Name: Date of Service: JAWANDA, PASSEY 10/13/2020 2:45 PM Medical Record Number: [id number] Patient Account Number: [id number] Date of Birth/Sex: Treating RN: 07-Dec-1965 (54 y.o. [name], [name] Primary Care Dmari Schubring: Elizabeth Palau Other Clinician: Referring Sukhraj Esquivias: Treating Yianna Tersigni/Extender: Duanne Limerick in Treatment: 4 Wound Status Wound Number: 9 Primary Diabetic Wound/Ulcer of the Lower Extremity Etiology: Wound Location: Left T Fourth oe Wound Open Wounding Event: Gradually Appeared Status: Date Acquired: 10/08/2020 Comorbid Anemia, Sleep Apnea, Hypotension, Type II Diabetes, Weeks Of Treatment: 0 History: Osteoarthritis, Neuropathy, Received Chemotherapy Clustered Wound: No Wound Measurements Length: (cm) 1.7 Width: (cm) 0.8 Depth: (cm) 0.1 Area: (cm) 1.068 Volume: (cm) 0.107 % Reduction in Area: 0% % Reduction in Volume: 0% Epithelialization: None Tunneling: No Undermining: No Wound Description Classification: Unable to visualize wound bed Wound Margin: Distinct, outline attached Exudate Amount: Medium Exudate Type: Serosanguineous Exudate Color: red, brown Foul Odor After Cleansing: No Slough/Fibrino Yes Wound Bed Granulation Amount: Small (1-33%) Exposed Structure Granulation Quality: Red Fascia Exposed: No Necrotic Amount: Large (67-100%) Fat Layer (Subcutaneous Tissue) Exposed: No Necrotic Quality: Eschar, Adherent Slough Tendon  Exposed: No Muscle Exposed: No Joint Exposed: No Bone Exposed: No Treatment Notes Wound #9 (Toe Fourth) Wound Laterality: Left Cleanser Soap and Water Discharge Instruction: May shower and wash wound with dial antibacterial soap and water prior to dressing change. Peri-Wound Care Topical Mupirocin Ointment Discharge Instruction: Apply Mupirocin (Bactroban) as instructed Clobetasol Cream Discharge Instruction: mixed with mupirocin Primary Dressing Secondary Dressing Secured With Compression Wrap Compression Stockings Add-Ons Electronic Signature(s) Signed: 10/13/2020 5:16:00 PM By: Fonnie Mu RN Entered By: Fonnie Mu on 10/13/2020 16:00:22 -------------------------------------------------------------------------------- Vitals Details Patient Name: Date of Service: [name], Grethel 10/13/2020 2:45 PM Medical Record Number: [id number] Patient Account Number: [id number] Date of Birth/Sex: Treating RN: 31-Jul-1966 (54 y.o. [name], [name] Primary Care Oshae Simmering: Elizabeth Palau Other Clinician: Referring Devanie Galanti: Treating Layonna Dobie/Extender: Duanne Limerick in Treatment: 4 Vital Signs Time Taken: 15:30 Temperature (F): 98.4 Height (in): 66 Pulse (bpm): 84 Weight (lbs): 195 Respiratory Rate (breaths/min): 18 Body Mass Index (BMI): 31.5 Blood Pressure (mmHg): 93/64 Capillary Blood Glucose (mg/dl): 782 Reference Range: 80 - 120 mg / dl Electronic Signature(s) Signed: 10/13/2020 5:16:00 PM By: Fonnie Mu RN Entered By: Fonnie Mu on 10/13/2020 15:31:06

## 2020-10-14 ENCOUNTER — Telehealth: Payer: Self-pay

## 2020-10-14 ENCOUNTER — Ambulatory Visit: Payer: BC Managed Care – PPO | Admitting: Internal Medicine

## 2020-10-14 ENCOUNTER — Encounter: Payer: Self-pay | Admitting: Internal Medicine

## 2020-10-14 VITALS — BP 118/81 | HR 77 | Temp 98.0°F | Wt 195.0 lb

## 2020-10-14 DIAGNOSIS — E1169 Type 2 diabetes mellitus with other specified complication: Secondary | ICD-10-CM

## 2020-10-14 DIAGNOSIS — I776 Arteritis, unspecified: Secondary | ICD-10-CM | POA: Diagnosis not present

## 2020-10-14 DIAGNOSIS — I7782 Antineutrophilic cytoplasmic antibody (ANCA) vasculitis: Secondary | ICD-10-CM

## 2020-10-14 DIAGNOSIS — M869 Osteomyelitis, unspecified: Secondary | ICD-10-CM

## 2020-10-14 LAB — AEROBIC/ANAEROBIC CULTURE W GRAM STAIN (SURGICAL/DEEP WOUND)

## 2020-10-14 MED ORDER — CIPROFLOXACIN HCL 500 MG PO TABS: 500.0000 mg | ORAL_TABLET | Freq: Two times a day (BID) | ORAL | 0 refills | Status: DC

## 2020-10-14 NOTE — Patient Instructions (Signed)
We'll see about setting you up with dalbavancin 1500 mg iv once   You'll also need to take ciprofloxacin 500 mg by mouth twice a day  You can continue bactrim prophylaxis   On 12/30 if you have amputation of the left forefoot, you'll not need the ciprofloxacin or more dalbavancin   Please have infectious disease see you when you have the amputation admission to make sure the surgery is what we discussed today ("transmetatarsal amputation") and if that the case no further need for antibiotics  If this surgical plan falls through, please see me again as soon as you know about this   Thank you

## 2020-10-14 NOTE — Progress Notes (Signed)
JAKKI, DOUGHTY ([id number]) Visit Report for 10/13/2020 Chief Complaint Document Details Patient Name: Date of Service: [name], [name] 10/13/2020 2:45 PM Medical Record Number: [id number] Patient Account Number: [id number] Date of Birth/Sex: Treating RN: 12-01-1965 (54 y.o. [name] Primary Care Provider: Elizabeth Palau Other Clinician: Referring Provider: Treating Provider/Extender: Duanne Limerick in Treatment: 4 Information Obtained from: Patient Chief Complaint Bilateral LE Ulcers Electronic Signature(s) Signed: 10/13/2020 3:04:39 PM By: Lenda Kelp PA-C Entered By: Lenda Kelp on 10/13/2020 15:04:39 -------------------------------------------------------------------------------- HPI Details Patient Name: Date of Service: [name], [name] 10/13/2020 2:45 PM Medical Record Number: [id number] Patient Account Number: [id number] Date of Birth/Sex: Treating RN: [birth year] (54 y.o. [name] Primary Care Provider: Elizabeth Palau Other Clinician: Referring Provider: Treating Provider/Extender: Duanne Limerick in Treatment: 4 History of Present Illness HPI Description: 09/15/2020 upon evaluation today patient appears to be doing somewhat poorly in regard to the wounds on her feet. She has a wound on the right lateral foot, right medial great toe, right dorsal great toe, left lateral foot, and right lateral lower leg. Currently based on what we are seeing the patient states that the worst issue is her right great toe. She dates that she wishes Dr. Victorino Dike had actually gone ahead and performed amputation at this site as well when he did the other toes they have all healed well that is the right third toe, left great toe, and left third toe. All are completely healed and did excellent. Currently she has been transition into Cam boots for both feet previously she was using postop shoes but apparently this caused some rubbing  on the lateral aspects of both feet and at the medial aspect of her right great toe which unfortunately has caused her a lot of discomfort and issues. She has been given mupirocin and betamethasone for some of the small wounds that occur over her legs and arms frequently. 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 Otherwise she also does have diabetes mellitus type 2. Currently the patient is on methotrexate and also undergoes chemotherapy infusions every 6 months for the Wegener's granulomatosis currently her amputations were asked on August 12, 2020 that was with Dr. Victorino Dike. His physician assistant Jill Alexanders referred her to me today. 09/22/2020 upon evaluation today patient appears to be making good progress with the Santyl at this point. I am extremely pleased with where things stand today and I do believe him to be able to debride some of the left lateral foot wound which we have not been able to do as the eschar was too thick. I think this is doing much better currently. 09/29/2020 upon evaluation today patient appears to be doing decently well in regard to her ulcers on her feet. I do believe the Santyl has been of benefit. With that being said I am concerned about the fact that she continues to have wounds over her arms and legs bilaterally which seem to continually be an issue. Fortunately there is no signs of active infection at this time but again I am not really sure where these are coming from. She is seeing dermatology they had suggested this was pyoderma but that seems kind of unusual based on how they seem to come up. She has been using a mix of steroid and mupirocin over these areas. 12/17; this is a patient called in urgently to  be seen. She was apparently in the shower washing the wound on the left lateral fifth metatarsal head and noticed a hole present. She also  complained of increasing pain.No systemic signs or symptoms. 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 She says her wound started from friction with Cam boots that she had after amputation of three toes two on the left and one on the right 10/13/2020 upon evaluation today patient appears to be doing really about the same in regard to her feet. She is scheduled for amputation surgery on the 30th of this month December 2021. 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 With that being said she does seem somewhat a little down about everything going on obviously she was hopeful to try to get the wound is healed things just have not progressed as we were hoping. Fortunately there is no signs of active infection at this time. No fevers, chills, nausea, vomiting, or diarrhea. 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 Electronic Signature(s) Signed: 10/13/2020 5:26:24 PM By: Lenda Kelp PA-C Entered By: Lenda Kelp on 10/13/2020 09:47:09 -------------------------------------------------------------------------------- Physical Exam Details Patient Name: Date of Service: [name], [name] 10/13/2020 2:45 PM Medical Record Number: [id number] Patient Account Number: [id number] Date of Birth/Sex: Treating RN: 12/22/1965 (54 y.o. [name] Primary Care Provider: Elizabeth Palau Other Clinician: Referring Provider: Treating Provider/Extender: Duanne Limerick in Treatment: 4 Constitutional Well-nourished and well-hydrated in no acute distress. Respiratory normal breathing without difficulty. Psychiatric this patient is able to make decisions and demonstrates good insight into disease process. Alert and Oriented x 3.  pleasant and cooperative. Notes Upon inspection patient's wounds did not appear to be doing nearly as well as they were in the past. I think she is still making very poor progression and she does have extensive osteomyelitis in the left foot and the right great toe is also problematic she will be undergoing surgery on the 30th of this month. Electronic Signature(s) Signed: 10/13/2020 5:26:43 PM By: Lenda Kelp PA-C Entered By: Lenda Kelp on 10/13/2020 17:26:42 -------------------------------------------------------------------------------- Physician Orders Details Patient Name: Date of Service: [name], [name] 10/13/2020 2:45 PM Medical Record Number: [id number] Patient Account Number: [id number] Date of Birth/Sex: Treating RN: Aug 21, 1966 (54 y.o. [name] Primary Care Provider: Elizabeth Palau Other Clinician: Referring Provider: Treating Provider/Extender: Duanne Limerick in Treatment: 4 Verbal / Phone Orders: No Diagnosis Coding ICD-10 Coding Code Description E11.621 Type 2 diabetes mellitus with foot ulcer L97.524 Non-pressure chronic ulcer of other part of left foot with necrosis of bone L97.512 Non-pressure chronic ulcer of other part of right foot with fat layer exposed L97.812 Non-pressure chronic ulcer of other part of right lower leg with fat layer exposed M31.31 Wegener's granulomatosis with renal involvement N18.9 Chronic kidney disease, unspecified Follow-up Appointments Return appointment in 3 weeks. Bathing/ Shower/ Hygiene May shower and wash wound with soap and water. - shower with hibiclens soap weekly Off-Loading Removable cast walker boot to: - Cam boot both feet to ambulate Wound Treatment Wound #1 - Foot Wound Laterality: Right, Lateral Cleanser: Soap and Water 1 x Per Day/30 Days Discharge Instructions: May shower and wash wound with dial antibacterial soap and water prior to  dressing change. Prim Dressing: KerraCel  Ag Gelling Fiber Dressing, 2x2 in (silver alginate) (DME) (Generic) 1 x Per Day/30 Days ary Discharge Instructions: Apply silver alginate to wound bed as instructed Secondary Dressing: Woven Gauze Sponge, Non-Sterile 4x4 in (DME) (Generic) 1 x Per Day/30 Days Discharge Instructions: Apply over primary dressing as directed. Secured With: [employer], 4.5x3.1 (in/yd) (DME) (Generic) 1 x Per Day/30 Days Discharge Instructions: Secure with Kerlix as directed. Secured With: Paper Tape, 2x10 (in/yd) (DME) (Generic) 1 x Per Day/30 Days Discharge Instructions: Secure dressing with tape as directed. Wound #2 - T Great oe Wound Laterality: Plantar, Right Cleanser: Soap and Water Discharge Instructions: May shower and wash wound with dial antibacterial soap and water prior to dressing change. Prim Dressing: KerraCel Ag Gelling Fiber Dressing, 2x2 in (silver alginate) ary Discharge Instructions: Apply silver alginate to wound bed as instructed Secondary Dressing: Woven Gauze Sponges 2x2 in Discharge Instructions: Apply over primary dressing as directed. Secured With: Insurance underwriter, Sterile 2x75 (in/in) Discharge Instructions: Secure with stretch gauze as directed. Wound #3 - T Great oe Wound Laterality: Right Cleanser: Soap and Water 1 x Per Day/30 Days Discharge Instructions: May shower and wash wound with dial antibacterial soap and water prior to dressing change. Prim Dressing: KerraCel Ag Gelling Fiber Dressing, 2x2 in (silver alginate) (DME) (Generic) 1 x Per Day/30 Days ary Discharge Instructions: Apply silver alginate to wound bed as instructed Secondary Dressing: Woven Gauze Sponges 2x2 in (DME) (Generic) 1 x Per Day/30 Days Discharge Instructions: Apply over primary dressing as directed. Secured With: Insurance underwriter, Sterile 2x75 (in/in) (DME) (Generic) 1 x Per Day/30 Days Discharge Instructions: Secure with stretch gauze as directed. Wound #4  - Foot Wound Laterality: Left, Lateral Cleanser: Soap and Water 1 x Per Day/30 Days Discharge Instructions: May shower and wash wound with dial antibacterial soap and water prior to dressing change. Prim Dressing: KerraCel Ag Gelling Fiber Dressing, 4x5 in (silver alginate) (DME) (Generic) 1 x Per Day/30 Days ary Discharge Instructions: Apply silver alginate to wound bed as instructed Secondary Dressing: Woven Gauze Sponge, Non-Sterile 4x4 in (DME) (Generic) 1 x Per Day/30 Days Discharge Instructions: Apply over primary dressing as directed. Secured With: [employer], 4.5x3.1 (in/yd) (DME) (Generic) 1 x Per Day/30 Days Discharge Instructions: Secure with Kerlix as directed. Secured With: Paper Tape, 2x10 (in/yd) (DME) (Generic) 1 x Per Day/30 Days Discharge Instructions: Secure dressing with tape as directed. Wound #6 - Lower Leg Wound Laterality: Left, Lateral Cleanser: Soap and Water 1 x Per Day/30 Days Discharge Instructions: May shower and wash wound with dial antibacterial soap and water prior to dressing change. Topical: Mupirocin Ointment 1 x Per Day/30 Days Discharge Instructions: Apply Mupirocin (Bactroban) as instructed Topical: Clobetasol Cream 1 x Per Day/30 Days Discharge Instructions: mixed with mupirocin Wound #7 - Lower Leg Wound Laterality: Left, Proximal Cleanser: Soap and Water 1 x Per Day/30 Days Discharge Instructions: May shower and wash wound with dial antibacterial soap and water prior to dressing change. Topical: Mupirocin Ointment 1 x Per Day/30 Days Discharge Instructions: Apply Mupirocin (Bactroban) as instructed Topical: Clobetasol Cream 1 x Per Day/30 Days Discharge Instructions: mixed with mupirocin Wound #9 - T Fourth oe Wound Laterality: Left Cleanser: Soap and Water 1 x Per Day/30 Days Discharge Instructions: May shower and wash wound with dial antibacterial soap and water prior to dressing change. Topical: Mupirocin Ointment 1 x Per Day/30  Days Discharge Instructions: Apply Mupirocin (Bactroban) as instructed Topical: Clobetasol  Cream 1 x Per Day/30 Days Discharge Instructions: mixed with mupirocin Electronic Signature(s) Signed: 10/13/2020 5:13:59 PM By: Lenda Kelp PA-C Signed: 10/13/2020 7:00:57 PM By: Zandra Abts RN, BSN Entered By: Zandra Abts on 10/13/2020 17:10:50 -------------------------------------------------------------------------------- Problem List Details Patient Name: Date of Service: [name], [name] 10/13/2020 2:45 PM Medical Record Number: [id number] Patient Account Number: [id number] Date of Birth/Sex: Treating RN: 07-24-1966 (54 y.o. Dorthula Perfect, Emeterio Reeve Primary Care Provider: Elizabeth Palau Other Clinician: Referring Provider: Treating Provider/Extender: Duanne Limerick in Treatment: 4 Active Problems ICD-10 Encounter Code Description Active Date MDM Diagnosis E11.621 Type 2 diabetes mellitus with foot ulcer 09/15/2020 No Yes L97.524 Non-pressure chronic ulcer of other part of left foot with necrosis of bone 10/08/2020 No Yes L97.512 Non-pressure chronic ulcer of other part of right foot with fat layer exposed 09/15/2020 No Yes L97.812 Non-pressure chronic ulcer of other part of right lower leg with fat layer 09/15/2020 No Yes exposed M31.31 Wegener's granulomatosis with renal involvement 09/15/2020 No Yes N18.9 Chronic kidney disease, unspecified 09/15/2020 No Yes Inactive Problems Resolved Problems Electronic Signature(s) Signed: 10/13/2020 3:04:26 PM By: Lenda Kelp PA-C Entered By: Lenda Kelp on 10/13/2020 15:04:26 -------------------------------------------------------------------------------- Progress Note Details Patient Name: Date of Service: [name], [name] 10/13/2020 2:45 PM Medical Record Number: [id number] Patient Account Number: [id number] Date of Birth/Sex: Treating RN: [birth year] (54 y.o. [name] Primary Care Provider:  Elizabeth Palau Other Clinician: Referring Provider: Treating Provider/Extender: Duanne Limerick in Treatment: 4 Subjective Chief Complaint Information obtained from Patient Bilateral LE Ulcers History of Present Illness (HPI) 09/15/2020 upon evaluation today patient appears to be doing somewhat poorly in regard to the wounds on her feet. She has a wound on the right lateral foot, right medial great toe, right dorsal great toe, left lateral foot, and right lateral lower leg. Currently based on what we are seeing the patient states that the worst issue is her right great toe. She dates that she wishes Dr. Victorino Dike had actually gone ahead and performed amputation at this site as well when he did the other toes they have all healed well that is the right third toe, left great toe, and left third toe. All are completely healed and did excellent. Currently she has been transition into Cam boots for both feet previously she was using postop shoes but apparently this caused some rubbing on the lateral aspects of both feet and at the medial aspect of her right great toe which unfortunately has caused her a lot of discomfort and issues. She has been given mupirocin and betamethasone for some of the small wounds that occur over her legs and arms frequently. 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 Otherwise she also does have diabetes mellitus type 2. Currently the patient is on methotrexate and also undergoes chemotherapy infusions every 6 months for the Wegener's granulomatosis currently her amputations were asked on August 12, 2020 that was with Dr. Victorino Dike. His physician assistant Jill Alexanders referred her to me today. 09/22/2020 upon evaluation today patient appears to be making good progress with the Santyl at this point. I am extremely pleased with where things  stand today and I do believe him to be able to debride some of the left lateral foot wound which we have not been able to do as the eschar was too thick. I think this is doing much better  currently. 09/29/2020 upon evaluation today patient appears to be doing decently well in regard to her ulcers on her feet. I do believe the Santyl has been of benefit. With that being said I am concerned about the fact that she continues to have wounds over her arms and legs bilaterally which seem to continually be an issue. Fortunately there is no signs of active infection at this time but again I am not really sure where these are coming from. She is seeing dermatology they had suggested this was pyoderma but that seems kind of unusual based on how they seem to come up. She has been using a mix of steroid and mupirocin over these areas. 12/17; this is a patient called in urgently to be seen. She was apparently in the shower washing the wound on the left lateral fifth metatarsal head and noticed a hole present. She also complained of increasing pain.No systemic signs or symptoms. 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 She says her wound started from friction with Cam boots that she had after amputation of three toes two on the left and one on the right 10/13/2020 upon evaluation today patient appears to be doing really about the same in regard to her feet. She is scheduled for amputation surgery on the 30th of this month December 2021. 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 With that being said she does seem somewhat a little down about everything going on obviously she was hopeful to try to get the wound is healed things just have not progressed as we were hoping. Fortunately there is no signs of active infection at this time. No fevers, chills, nausea, vomiting, or diarrhea. She did  have evidence of MRSA on culture but again right now I think that really something that should be handled by her infectious disease doctor who she will be seeing tomorrow. Objective Constitutional Well-nourished and well-hydrated in no acute distress. Vitals Time Taken: 3:30 PM, Height: 66 in, Weight: 195 lbs, BMI: 31.5, Temperature: 98.4 F, Pulse: 84 bpm, Respiratory Rate: 18 breaths/min, Blood Pressure: 93/64 mmHg, Capillary Blood Glucose: 120 mg/dl. Respiratory normal breathing without difficulty. Psychiatric this patient is able to make decisions and demonstrates good insight into disease process. Alert and Oriented x 3. pleasant and cooperative. General Notes: Upon inspection patient's wounds did not appear to be doing nearly as well as they were in the past. I think she is still making very poor progression and she does have extensive osteomyelitis in the left foot and the right great toe is also problematic she will be undergoing surgery on the 30th of this month. Integumentary (Hair, Skin) Wound #1 status is Open. Original cause of wound was Shear/Friction. The wound is located on the Right,Lateral Foot. The wound measures 1cm length x 0.4cm width x 0.1cm depth; 0.314cm^2 area and 0.031cm^3 volume. There is Fat Layer (Subcutaneous Tissue) exposed. There is no tunneling or undermining noted. There is a medium amount of serosanguineous drainage noted. The wound margin is flat and intact. There is large (67-100%) red granulation within the wound bed. There is a small (1-33%) amount of necrotic tissue within the wound bed including Adherent Slough. Wound #2 status is Open. Original cause of wound was Shear/Friction. The wound is located on the [employer]. The wound measures 0.8cm length oe x 1.6cm width x 0.1cm depth; 1.005cm^2 area and 0.101cm^3 volume.  There is Fat Layer (Subcutaneous Tissue) exposed. There is no tunneling or undermining noted. There is a medium amount of  serosanguineous drainage noted. The wound margin is flat and intact. There is large (67-100%) pink, pale granulation within the wound bed. There is a small (1-33%) amount of necrotic tissue within the wound bed including Adherent Slough. Wound #3 status is Open. Original cause of wound was Trauma. The wound is located on the Right T Great. The wound measures 0.6cm length x 4.5cm width oe x 0.3cm depth; 2.121cm^2 area and 0.636cm^3 volume. There is Fat Layer (Subcutaneous Tissue) exposed. There is no tunneling or undermining noted. There is a medium amount of serosanguineous drainage noted. The wound margin is epibole. There is large (67-100%) pink granulation within the wound bed. There is a small (1-33%) amount of necrotic tissue within the wound bed including Adherent Slough. Wound #4 status is Open. Original cause of wound was Shear/Friction. The wound is located on the Left,Lateral Foot. The wound measures 5cm length x 4.3cm width x 1.6cm depth; 16.886cm^2 area and 27.018cm^3 volume. There is bone and Fat Layer (Subcutaneous Tissue) exposed. There is no undermining noted, however, there is tunneling at 10:00 with a maximum distance of 1.1cm. There is a large amount of serosanguineous drainage noted. The wound margin is flat and intact. There is small (1-33%) pink granulation within the wound bed. There is a large (67-100%) amount of necrotic tissue within the wound bed including Adherent Slough. Wound #5 status is Healed - Epithelialized. Original cause of wound was Gradually Appeared. The wound is located on the Right,Lateral Lower Leg. The wound measures 0cm length x 0cm width x 0cm depth; 0cm^2 area and 0cm^3 volume. There is no tunneling or undermining noted. There is a none present amount of drainage noted. The wound margin is flat and intact. There is no granulation within the wound bed. There is no necrotic tissue within the wound bed. Wound #6 status is Open. Original cause of wound was  Gradually Appeared. The wound is located on the Left,Lateral Lower Leg. The wound measures 0.1cm length x 0.1cm width x 0.1cm depth; 0.008cm^2 area and 0.001cm^3 volume. Wound #7 status is Open. Original cause of wound was Gradually Appeared. The wound is located on the Left,Proximal Lower Leg. The wound measures 0.1cm length x 0.1cm width x 0.1cm depth; 0.008cm^2 area and 0.001cm^3 volume. Wound #8 status is Healed - Epithelialized. Original cause of wound was Gradually Appeared. The wound is located on the Right Forearm. The wound measures 0cm length x 0cm width x 0cm depth; 0cm^2 area and 0cm^3 volume. There is no tunneling or undermining noted. There is a none present amount of drainage noted. The wound margin is distinct with the outline attached to the wound base. There is no granulation within the wound bed. There is no necrotic tissue within the wound bed. Wound #9 status is Open. Original cause of wound was Gradually Appeared. The wound is located on the Left T Fourth. The wound measures 1.7cm length x oe 0.8cm width x 0.1cm depth; 1.068cm^2 area and 0.107cm^3 volume. There is no tunneling or undermining noted. There is a medium amount of serosanguineous drainage noted. The wound margin is distinct with the outline attached to the wound base. There is small (1-33%) red granulation within the wound bed. There is a large (67-100%) amount of necrotic tissue within the wound bed including Eschar and Adherent Slough. 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 Bathing/ Shower/  Hygiene: May shower and wash wound with soap and water. - shower with hibiclens soap weekly Off-Loading: Removable cast walker boot to: - Cam boot both feet to ambulate WOUND #1: - Foot Wound Laterality: Right, Lateral Cleanser: Soap and Water 1 x Per Day/30 Days Discharge Instructions: May shower and wash wound with dial antibacterial soap and water prior to dressing change. Prim Dressing: KerraCel Ag Gelling Fiber Dressing, 2x2 in (silver alginate) (DME) (Generic) 1 x Per Day/30 Days ary Discharge Instructions: Apply silver alginate to wound bed as instructed Secondary Dressing: Woven Gauze Sponge, Non-Sterile 4x4 in (DME) (Generic) 1 x Per Day/30 Days Discharge Instructions: Apply over primary dressing as directed. Secured With: [employer] Sterile, 4.5x3.1 (in/yd) (DME) (Generic) 1 x Per Day/30 Days Discharge Instructions: Secure with Kerlix as directed. Secured With: Paper T ape, 2x10 (in/yd) (DME) (Generic) 1 x Per Day/30 Days Discharge Instructions: Secure dressing with tape as directed. WOUND #2: - T Great Wound Laterality: Plantar, Right oe Cleanser: Soap and Water Discharge Instructions: May shower and wash wound with dial antibacterial soap and water prior to dressing change. Prim Dressing: KerraCel Ag Gelling Fiber Dressing, 2x2 in (silver alginate) ary Discharge Instructions: Apply silver alginate to wound bed as instructed Secondary Dressing: Woven Gauze Sponges 2x2 in Discharge Instructions: Apply over primary dressing as directed. Secured With: Insurance underwriterConforming Stretch Gauze Bandage, Sterile 2x75 (in/in) Discharge Instructions: Secure with stretch gauze as directed. WOUND #3: - T Great Wound Laterality: Right oe Cleanser: Soap and Water 1 x Per Day/30 Days Discharge Instructions: May shower and wash wound with dial antibacterial soap and water prior to dressing change. Prim Dressing: KerraCel Ag Gelling Fiber Dressing, 2x2 in (silver alginate) (DME) (Generic) 1 x Per Day/30  Days ary Discharge Instructions: Apply silver alginate to wound bed as instructed Secondary Dressing: Woven Gauze Sponges 2x2 in (DME) (Generic) 1 x Per Day/30 Days Discharge Instructions: Apply over primary dressing as directed. Secured With: Insurance underwriterConforming Stretch Gauze Bandage, Sterile 2x75 (in/in) (DME) (Generic) 1 x Per Day/30 Days Discharge Instructions: Secure with stretch gauze as directed. WOUND #4: - Foot Wound Laterality: Left, Lateral Cleanser: Soap and Water 1 x Per Day/30 Days Discharge Instructions: May shower and wash wound with dial antibacterial soap and water prior to dressing change. Prim Dressing: KerraCel Ag Gelling Fiber Dressing, 4x5 in (silver alginate) (DME) (Generic) 1 x Per Day/30 Days ary Discharge Instructions: Apply silver alginate to wound bed as instructed Secondary Dressing: Woven Gauze Sponge, Non-Sterile 4x4 in (DME) (Generic) 1 x Per Day/30 Days Discharge Instructions: Apply over primary dressing as directed. Secured With: [employer] Roll Sterile, 4.5x3.1 (in/yd) (DME) (Generic) 1 x Per Day/30 Days Discharge Instructions: Secure with Kerlix as directed. Secured With: Paper T ape, 2x10 (in/yd) (DME) (Generic) 1 x Per Day/30 Days Discharge Instructions: Secure dressing with tape as directed. WOUND #6: - Lower Leg Wound Laterality: Left, Lateral Cleanser: Soap and Water 1 x Per Day/30 Days Discharge Instructions: May shower and wash wound with dial antibacterial soap and water prior to dressing change. Topical: Mupirocin Ointment 1 x Per Day/30 Days Discharge Instructions: Apply Mupirocin (Bactroban) as instructed Topical: Clobetasol Cream 1 x Per Day/30 Days Discharge Instructions: mixed  with mupirocin WOUND #7: - Lower Leg Wound Laterality: Left, Proximal Cleanser: Soap and Water 1 x Per Day/30 Days Discharge Instructions: May shower and wash wound with dial antibacterial soap and water prior to dressing change. Topical: Mupirocin Ointment 1 x Per Day/30  Days Discharge Instructions: Apply Mupirocin (Bactroban) as instructed Topical: Clobetasol Cream 1 x Per Day/30 Days Discharge Instructions: mixed with mupirocin WOUND #9: - T Fourth Wound Laterality: Left oe Cleanser: Soap and Water 1 x Per Day/30 Days Discharge Instructions: May shower and wash wound with dial antibacterial soap and water prior to dressing change. Topical: Mupirocin Ointment 1 x Per Day/30 Days Discharge Instructions: Apply Mupirocin (Bactroban) as instructed Topical: Clobetasol Cream 1 x Per Day/30 Days Discharge Instructions: mixed with mupirocin 1. Would recommend currently that we have the patient discuss with infectious disease tomorrow what antibiotics to start for her. I think that is good to be the best way to go we did discuss the possibility of a linezolid that she does have some interactions with the trazodone and some of her other current medications unfortunately. 2. With regard to the dressings we will continue with silver alginate pretty much across the board I think that is the best way to go currently. 3. I am also can recommend that the patient continue to monitor for any signs of worsening infection if she has any issues in interim between now when she has the surgery she should contact Dr. Victorino Dike ASAP or go to the hospital if she cannot get in touch with him such as on the weekend or otherwise. We will see patient back for reevaluation in 3 weeks here in the clinic. If anything worsens or changes patient will contact our office for additional recommendations. Electronic Signature(s) Signed: 10/13/2020 5:27:30 PM By: Lenda Kelp PA-C Entered By: Lenda Kelp on 10/13/2020 17:27:29 -------------------------------------------------------------------------------- SuperBill Details Patient Name: Date of Service: [name], [name] 10/13/2020 Medical Record Number: [id number] Patient Account Number: [id number] Date of Birth/Sex: Treating RN: 1966-08-27  (54 y.o. [name] Primary Care Provider: Elizabeth Palau Other Clinician: Referring Provider: Treating Provider/Extender: Duanne Limerick in Treatment: 4 Diagnosis Coding ICD-10 Codes Code Description [phone] Type 2 diabetes mellitus with foot ulcer L97.524 Non-pressure chronic ulcer of other part of left foot with necrosis of bone L97.512 Non-pressure chronic ulcer of other part of right foot with fat layer exposed L97.812 Non-pressure chronic ulcer of other part of right lower leg with fat layer exposed M31.31 Wegener's granulomatosis with renal involvement N18.9 Chronic kidney disease, unspecified Facility Procedures CPT4 Code: [id number] Description: [phone] - WOUND CARE VISIT-LEV 5 EST PT Modifier: Quantity: 1 Physician Procedures : CPT4 Code Description Modifier [id number] - WC PHYS LEVEL 3 - EST PT ICD-10 Diagnosis Description E11.621 Type 2 diabetes mellitus with foot ulcer L97.524 Non-pressure chronic ulcer of other part of left foot with necrosis of bone L97.512  Non-pressure chronic ulcer of other part of right foot with fat layer exposed L97.812 Non-pressure chronic ulcer of other part of right lower leg with fat layer exposed Quantity: 1 Electronic Signature(s) Signed: 10/13/2020 7:00:57 PM By: Zandra Abts RN, BSN Signed: 10/14/2020 4:47:28 PM By: Lenda Kelp PA-C Previous Signature: 10/13/2020 5:27:43 PM Version By: Lenda Kelp PA-C Entered By: Zandra Abts on 10/13/2020 17:56:41

## 2020-10-14 NOTE — Progress Notes (Signed)
[facility]  Patient Active Problem List   Diagnosis Date Noted  . Generalized abdominal pain 10/09/2018  . Midline low back pain 09/11/2018  . Malfunction of intrathecal infusion pump 09/11/2018  . 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 Steroid-induced hyperglycemia 05/11/2018  . Weakness 05/02/2018  . Morbid obesity ([place]) 01/25/2018  . Neuropathy 01/25/2018  . Presence of intrathecal pump 08/02/2017  . Rectocele 03/09/2017  . Urinary hesitancy 03/09/2017  . Abnormal finding on imaging 12/19/2016  . Esophageal dysphagia 12/19/2016  . Current chronic use of systemic steroids 10/26/2016  . IFG (impaired fasting glucose) 10/26/2016  . Arthralgia of multiple joints 07/15/2016  . Headache 07/15/2016  . Maculopapular rash, generalized 07/15/2016  . Peripheral neuropathy 07/04/2016  . Rash 07/04/2016  . CKD (chronic kidney disease) stage 3, GFR 30-59 ml/min (HCC) 07/04/2016  . 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 Neuropathic pain of both feet 01/11/2016  . OSA (obstructive sleep apnea) 12/29/2015  . Chronic insomnia 12/29/2015  . Mixed hyperlipidemia 12/29/2015  . Obesity, Class II, BMI 35-39.9, with comorbidity 12/29/2015  . Polyneuropathy associated with underlying disease ([place]) 12/29/2015  . Wegener's granulomatosis (granulomatosis with polyangiitis) 12/29/2015  . Class 2 drug-induced obesity with serious comorbidity and body mass index (BMI) of 35.0 to 35.9 in adult 12/29/2015  . Other mechanical complication of implanted electronic neurostimulator of spinal cord electrode (lead), initial encounter ([place]) 08/04/2015  . Adjustment disorder with depressed mood 03/29/2015  . Hyperlipidemia 07/20/2014  . Mononeuritis 07/20/2014  . Obesity (BMI 30-39.9) 07/20/2014  . Essential hypertension 07/20/2014  . 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 Neurocardiogenic syncope 07/06/2014  . Chronic anemia  06/08/2014  . POTS (postural orthostatic tachycardia syndrome) 06/08/2014  . Tachycardia 06/08/2014  . Hypotension 06/03/2014  . Anal fissure 09/16/2013  . Adrenal insufficiency ([place]) 05/30/2013  . Encounter for long-term (current) use of high-risk medication 05/30/2013  . Fall 05/30/2013  . Hemorrhage of rectum and anus 05/30/2013  . Hypokalemia 05/30/2013  . Leukocytosis 05/30/2013  . Other long term (current) drug therapy 05/30/2013  . Syncope and collapse 05/30/2013  . 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[place]) 05/30/2013  . 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[place]) 05/30/2013  . Wegner's disease (congenital syphilitic osteochondritis)   . Gastroesophageal reflux disease without esophagitis 03/07/2012  . Other diseases of trachea and bronchus 08/30/2011  . 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 Atrophy of nasal turbinates 07/25/2011  . Nasal septal perforation 07/25/2011  . Cough 03/24/2011  . Limited granulomatosis with polyangiitis ([place]) 03/24/2011  . 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 Sleep apnea 03/24/2011      Subjective:    Patient ID: [name], female    DOB: 10/17/1966, 54 y.o.   MRN: [id number]  Chief Complaint  Patient presents with  . New Patient (Initial Visit)    HPI:  [name] is a 54 y.o. female wegner, referred here by ?dr Dellia Nims for left foot open wound and imaging suggestion of OM  Hx via patient and review of epic including care everywhere  From dr Lollie Sails of [facility] on 10/12/20 Patient has wegner/anca-associated vasculitis Nonsmoker Taking 20 mg prednisone chronically along with methotrexate and rituximab She is on bactrim ds tiw prophylaxis Xray left foot 12/17 showed bony destructie changes head of 1st mt, base of 3rd prox phalanx, head/neck of 5th mt concerning for OM. Suspected fx left 5th mt. Plan on holding rituximab given concerning changes of left foot"  She also has recurrent ulcers in  bilateral LE, upper back, lower abd area. 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 She endorsed that the toes would get blister/red and then became black/dry and start gradually falling off  She denies hx raynauds; nonsmoker. No history per rheum note of CREST syndrome  She presented today for imaging suggestion of left foot om. But had an ulcer for several weeks left lateral foot not improving with wound care.  She sees dr Dellia Nims of wound care and podiatry as well  Podiatry is planning on tma (per her report) of the left foot on 12/30  She has a recent bedside left foot chronic wound tissue/bone cx 12/17 that grew pseudomonas and mrsa. She was sent to id clinic for medication management  She denies fever, chill She has some increased pain/swelling in the chronic wound on the left foot She is not taking other abx except bactrim prophy    Allergies  Allergen Reactions  . Ibuprofen Other (See Comments)    Pt is unable to take this due to kidney problems.   Other reaction(s): Other Not to take with other medications   . Penicillins Rash and Other (See Comments)    Has patient had a PCN reaction causing immediate rash, facial/tongue/throat swelling, SOB or lightheadedness with hypotension: No Has patient had a PCN reaction causing severe rash involving mucus membranes or skin necrosis: No Has patient had a PCN reaction that required hospitalization No Has patient had a PCN reaction occurring within the last 10 years: No If all of the above answers are "NO", then may proceed with Cephalosporin use.      Outpatient Medications Prior to Visit  Medication Sig Dispense Refill  . acetaminophen (TYLENOL) 325 MG tablet Take 2 tablets (650 mg total) by mouth every 6 (six) hours as needed for mild pain (or Fever >/= 101).    Marland Kitchen albuterol (PROVENTIL HFA;VENTOLIN HFA) 108 (90 Base) MCG/ACT inhaler Inhale 2 puffs into the  lungs every 6 (six) hours as needed for wheezing or shortness of breath.    Marland Kitchen b complex vitamins tablet Take 1 tablet by mouth daily.    . Calcium Carb-Cholecalciferol (CALCIUM CARBONATE-VITAMIN D3 PO) Take 1 tablet by mouth daily.    . DULoxetine (CYMBALTA) 60 MG capsule Take 60 mg by mouth 2 (two) times daily.    . famotidine (PEPCID) 20 MG tablet Take 20 mg daily    . ferrous sulfate 325 (65 FE) MG tablet Take 325 mg by mouth daily with breakfast.    . fluticasone (FLONASE) 50 MCG/ACT nasal spray Place 2 sprays into both nostrils daily as needed for rhinitis.    . folic acid (FOLVITE) 1 MG tablet Take 1 mg by mouth daily.    . furosemide (LASIX) 40 MG tablet Take 1 tablet (40 mg total) by mouth as directed. 90 tablet 3  . gabapentin (NEURONTIN) 300 MG capsule Take 3 capsules (900 mg total) by mouth in the morning, 2 capsules (600 mg total) in the early afternoon, and 4 capsules (1200 mg total) in the evening.    . lidocaine (LIDODERM) 5 % Place 1 patch onto the skin daily as needed (for pain). Remove & Discard patch within 12 hours or as directed by MD    . magnesium oxide (MAG-OX) 400 MG tablet Take 400 mg by mouth 2 (two) times daily.    . metFORMIN (GLUCOPHAGE)  500 MG tablet Take 500 mg by mouth 2 (two) times daily with a meal.    . methotrexate 50 MG/2ML injection Inject 1 mL into the skin once a week.     . montelukast (SINGULAIR) 10 MG tablet Take 10 mg by mouth daily.  3  . Potassium Chloride Crys ER (K-DUR PO) Take 40 mEq by mouth 3 (three) times daily.    . predniSONE (DELTASONE) 5 MG tablet Take 20 mg by mouth daily with breakfast.     . PRESCRIPTION MEDICATION by Intrathecal route as directed. ziconotide (PF) 25 mcg/mL, BUPivacaine (PF) 18 mg/mL 20 mL intrathecal pump refill    . sulfamethoxazole-trimethoprim (BACTRIM,SEPTRA) 400-80 MG tablet TAKE 1 TABLET 3 TIMES A WEEK  3  . tapentadol (NUCYNTA) 50 MG tablet Take 50 mg by mouth 2 (two) times daily.    . tizanidine (ZANAFLEX) 2 MG  capsule TAKE ONE CAPSULE BY MOUTH 3 TIMES A DAY AS NEEDED FOR MUSCLE SPASMS    . topiramate (TOPAMAX) 100 MG tablet Take 100 mg by mouth daily.    . traZODone (DESYREL) 100 MG tablet Take 1 tablet (100 mg total) by mouth at bedtime.    . vitamin B-12 (CYANOCOBALAMIN) 1000 MCG tablet Take 1,000 mcg by mouth daily.    . Vitamin D, Ergocalciferol, (DRISDOL) 50000 units CAPS capsule Take 50,000 Units by mouth every 7 (seven) days. Pt takes on Friday.    Marland Kitchen ZICONOTIDE ACETATE IT by Intrathecal route every 3 (three) months.     No facility-administered medications prior to visit.     Past Medical History:  Diagnosis Date  . Chronic cough   . Chronic pain   . Diabetes mellitus without complication ([place])   . Dyspnea   . GERD (gastroesophageal reflux disease)   . Hypokalemia   . Hypothyroidism   . Neurocardiogenic syncope   . OSA (obstructive sleep apnea)    does not use CPAP  . Osteomyelitis ([place])    bilateral feet  . Peripheral neuropathy   . Presence of intrathecal pump    recieves Prialt/bupivicaine  . Wegener's granulomatosis 2009  . Wegner's disease (congenital syphilitic osteochondritis)       Past Surgical History:  Procedure Laterality Date  . AMPUTATION TOE Bilateral 08/12/2020   Procedure: Right 3rd toe amputation; left hallux and 3rd toe amputation;  Surgeon: Wylene Simmer, MD;  Location: [facility];  Service: Orthopedics;  Laterality: Bilateral;  75min  . APPENDECTOMY    . BACK SURGERY    . CHOLECYSTECTOMY    . COLONOSCOPY N/A 06/02/2013   Procedure: COLONOSCOPY;  Surgeon: Wonda Horner, MD;  Location: [facility] ENDOSCOPY;  Service: Endoscopy;  Laterality: N/A;  . HERNIA REPAIR  2000  . INCISION AND DRAINAGE ABSCESS Left 07/07/2016   Procedure: DEBRIDMENT LEFT THIGH ABSCESS, EXISION ACUTE SKIN RASH LEFT THIGH(1CM LESION);  Surgeon: Fanny Skates, MD;  Location: [facility] ORS;  Service: General;  Laterality: Left;  . SEPTOPLASTY  02/2013  . spleen anuyism    . splenic  aneurysm    . VAGINAL HYSTERECTOMY        Family History  Problem Relation Age of Onset  . Diabetes Mother   . Heart disease Mother   . Diabetes Father       Social History   Socioeconomic History  . Marital status: Married    Spouse name: Not on file  . Number of children: Not on file  . Years of education: Not on file  . Highest education  level: Not on file  Occupational History  . Not on file  Tobacco Use  . Smoking status: Never Smoker  . Smokeless tobacco: Never Used  Vaping Use  . Vaping Use: Never used  Substance and Sexual Activity  . Alcohol use: Yes    Comment: occasionally  . Drug use: No  . Sexual activity: Yes    Birth control/protection: Surgical  Other Topics Concern  . Not on file  Social History Narrative  . Not on file   Social Determinants of Health   Financial Resource Strain: Not on file  Food Insecurity: Not on file  Transportation Needs: Not on file  Physical Activity: Not on file  Stress: Not on file  Social Connections: Not on file  Intimate Partner Violence: Not on file      Review of Systems   other ros negative  Objective:    BP 118/81   Pulse 77   Temp 98 F (36.7 C) (Oral)   Wt 195 lb (88.5 kg)   BMI 31.47 kg/m  Nursing note and vital signs reviewed.  Physical Exam Here in wheelchair, no distress, conversant Heent: moon facie, normocephalic otherwise, per, conj clear, eomi Neck supple cv rrr no mrg Lungs clear; normal respiratory effort abd s/nt Ext no edema msk old right 3rd toe amputation, left first and 3rd toe amputation. An open wound left foot laterally able to see the metatarsal; there is slight blister/echymosis surrounding the wound but no gross swelling/tenderness/redness or purulence Skin multiple atrophic scar on bilateral le, upper back, lower abd; no sclerodactyly Neuro cn2-12 intact, strength symmetric Psych alert/oriented        Labs: 12/21 esr 37; crp 50 12/21 cbc 17/12/538; cr 0.97;  lft wnl; alb 3.5  Micro: 12/17 left foot wound cx mrsa (R bactrim/tetra; S clinda), pseudomonas aeruginosa (pan sensitive)  Serology: 08/2020 quant gold negative, hep c negative; hep b sAg/cAb negative  Imaging: 12/17 xr left foot I personally reviewed xray and concurred age indeterminate osseous destruction along digits mentioned below; no swelling/air 1. Previous first and third digit amputation. Bony destructive changes at the head of the first metatarsal, in the region of base of third proximal phalanx, and involving the head and neck of the fifth metatarsal, concerning for osteomyelitis. Suspected small erosion at the lateral base of second proximal phalanx also suspicious for osteomyelitis. 2. Suspected pathologic fracture involving the neck of the fifth metatarsal.   Assessment & Plan:   Patient Active Problem List   Diagnosis Date Noted  . Generalized abdominal pain 10/09/2018  . Midline low back pain 09/11/2018  . Malfunction of intrathecal infusion pump 09/11/2018  . 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 Steroid-induced hyperglycemia 05/11/2018  . Weakness 05/02/2018  . Morbid obesity ([place]) 01/25/2018  . Neuropathy 01/25/2018  . Presence of intrathecal pump 08/02/2017  . Rectocele 03/09/2017  . Urinary hesitancy 03/09/2017  . Abnormal finding on imaging 12/19/2016  . Esophageal dysphagia 12/19/2016  . Current chronic use of systemic steroids 10/26/2016  . IFG (impaired fasting glucose) 10/26/2016  . Arthralgia of multiple joints 07/15/2016  . Headache 07/15/2016  . Maculopapular rash, generalized 07/15/2016  . Peripheral neuropathy 07/04/2016  . Rash 07/04/2016  . CKD (chronic kidney disease) stage 3, GFR 30-59 ml/min (HCC) 07/04/2016  . 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 Neuropathic pain of both feet 01/11/2016  . OSA (obstructive sleep apnea) 12/29/2015  . Chronic insomnia 12/29/2015  . Mixed hyperlipidemia 12/29/2015   . Obesity, Class II, BMI 35-39.9,  with comorbidity 12/29/2015  . Polyneuropathy associated with underlying disease ([place]) 12/29/2015  . Wegener's granulomatosis (granulomatosis with polyangiitis) 12/29/2015  . Class 2 drug-induced obesity with serious comorbidity and body mass index (BMI) of 35.0 to 35.9 in adult 12/29/2015  . Other mechanical complication of implanted electronic neurostimulator of spinal cord electrode (lead), initial encounter ([place]) 08/04/2015  . Adjustment disorder with depressed mood 03/29/2015  . Hyperlipidemia 07/20/2014  . Mononeuritis 07/20/2014  . Obesity (BMI 30-39.9) 07/20/2014  . Essential hypertension 07/20/2014  . 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 Neurocardiogenic syncope 07/06/2014  . Chronic anemia 06/08/2014  . POTS (postural orthostatic tachycardia syndrome) 06/08/2014  . Tachycardia 06/08/2014  . Hypotension 06/03/2014  . Anal fissure 09/16/2013  . Adrenal insufficiency ([place]) 05/30/2013  . Encounter for long-term (current) use of high-risk medication 05/30/2013  . Fall 05/30/2013  . Hemorrhage of rectum and anus 05/30/2013  . Hypokalemia 05/30/2013  . Leukocytosis 05/30/2013  . Other long term (current) drug therapy 05/30/2013  . Syncope and collapse 05/30/2013  . 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[place]) 05/30/2013  . 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[place]) 05/30/2013  . Wegner's disease (congenital syphilitic osteochondritis)   . Gastroesophageal reflux disease without esophagitis 03/07/2012  . Other diseases of trachea and bronchus 08/30/2011  . 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 Atrophy of nasal turbinates 07/25/2011  . Nasal septal perforation 07/25/2011  . Cough 03/24/2011  . Limited granulomatosis with polyangiitis ([place]) 03/24/2011  . 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   Relevant  Orders   C-reactive protein   CBC w/Diff   Comp Met (CMET)    Other Visit Diagnoses    Type 2 diabetes mellitus with other specified complication, unspecified whether long term insulin use (HCC)       Relevant Orders   C-reactive protein   CBC w/Diff   Comp Met (CMET)   Osteomyelitis, unspecified site, unspecified type ([place])       Relevant Orders   C-reactive protein   CBC w/Diff   Comp Met (CMET)      54 yo female vasculitis chronic on immunosuppression here for imaging om changes left foot in several areas along with chronic open sore left lateral foot that is confirmed on bone/tissue cx for om with presence of mrsa/pseudomonas  Interestingly she has had what seems to be described and dry gangrene of her toes with partial amputation  She is a nonsmoker and doesn't have hx of crest syndrome per rheumatology  She also has recurrent ulcer/sores of her legs/upper back/lower abdomen in the distribution of hands reach; I query if pathogenic scratching/self traumatizing. She mentioned she was told they could be pyoderma gangrenosum but the appearance and hx don't suggest so. I advised husband to notice if she scratches frequently and consider wearing mitts at night when she sleep  With regard to the xray om changes, the culture of tissue/bone does suggest she could have om. On exam today 12/23 there is some blistering surrounding the left foot lateral open wound; her crp/wbc rather high. Although lack of fever (on immunosuppression), I would start her on abx now awaiting tma  As tma is a week from now, I do not want to place a picc. It would be reasonable based on the microbiology to give her a dose of dalbavancin 1500 mg and oral cipro until the tma, at which time  if there is no deep infection on surgery can stop abx   -discuss with our nursing team to help arrange dalabavancin outpatient infusion once $RemoveBefor'1500mg'[id number]$  by early next week -start cipro 500 mg po bid -can continue her bactrim ds tiw  for prophylaxis of pjp  -discuss with her to ask for id input at the time of surgery to determine if we can stop abx after surgery  I spend 60 minutes reviewing case, and >50% of the time counseling/educating/discussing treatment plan with patient  Follow-up: No follow-ups on file.      Jabier Mutton, [facility] [place] -- -- pager   [phone] cell 10/14/2020, 2:57 PM

## 2020-10-14 NOTE — Telephone Encounter (Signed)
I called short stay to schedule patient for IV Dalbavancin 1500 mg once. No answer and I left a message to have them call our office back. Patient is requesting her appointment be early morning 10/18/20, patient will be having a pre op COVID test on the same day as well at 10:25 am and will have to quarantine after her test. Orders have also been faxed to short stay.  Cecile Gillispie T Pricilla Loveless

## 2020-10-15 LAB — CBC WITH DIFFERENTIAL/PLATELET
Absolute Monocytes: 740 cells/uL (ref 200–950)
Basophils Absolute: 52 cells/uL (ref 0–200)
Basophils Relative: 0.3 %
Eosinophils Absolute: 34 cells/uL (ref 15–500)
Eosinophils Relative: 0.2 %
HCT: 36.5 % (ref 35.0–45.0)
Hemoglobin: 11.5 g/dL — ABNORMAL LOW (ref 11.7–15.5)
Lymphs Abs: 4283 cells/uL — ABNORMAL HIGH (ref 850–3900)
MCH: 25.7 pg — ABNORMAL LOW (ref 27.0–33.0)
MCHC: 31.5 g/dL — ABNORMAL LOW (ref 32.0–36.0)
MCV: 81.7 fL (ref 80.0–100.0)
MPV: 9.6 fL (ref 7.5–12.5)
Monocytes Relative: 4.3 %
Neutro Abs: 12092 cells/uL — ABNORMAL HIGH (ref 1500–7800)
Neutrophils Relative %: 70.3 %
Platelets: 521 10*3/uL — ABNORMAL HIGH (ref 140–400)
RBC: 4.47 10*6/uL (ref 3.80–5.10)
RDW: 16.9 % — ABNORMAL HIGH (ref 11.0–15.0)
Total Lymphocyte: 24.9 %
WBC: 17.2 10*3/uL — ABNORMAL HIGH (ref 3.8–10.8)

## 2020-10-15 LAB — COMPREHENSIVE METABOLIC PANEL
AG Ratio: 1.6 (calc) (ref 1.0–2.5)
ALT: 14 U/L (ref 6–29)
AST: 11 U/L (ref 10–35)
Albumin: 3.7 g/dL (ref 3.6–5.1)
Alkaline phosphatase (APISO): 120 U/L (ref 37–153)
BUN: 19 mg/dL (ref 7–25)
CO2: 27 mmol/L (ref 20–32)
Calcium: 9.5 mg/dL (ref 8.6–10.4)
Chloride: 100 mmol/L (ref 98–110)
Creat: 0.96 mg/dL (ref 0.50–1.05)
Globulin: 2.3 g/dL (calc) (ref 1.9–3.7)
Glucose, Bld: 123 mg/dL — ABNORMAL HIGH (ref 65–99)
Potassium: 4.3 mmol/L (ref 3.5–5.3)
Sodium: 141 mmol/L (ref 135–146)
Total Bilirubin: 0.2 mg/dL (ref 0.2–1.2)
Total Protein: 6 g/dL — ABNORMAL LOW (ref 6.1–8.1)

## 2020-10-15 LAB — C-REACTIVE PROTEIN: CRP: 53.8 mg/L — ABNORMAL HIGH (ref <8.0)

## 2020-10-18 ENCOUNTER — Other Ambulatory Visit (HOSPITAL_COMMUNITY)
Admission: RE | Admit: 2020-10-18 | Discharge: 2020-10-18 | Disposition: A | Discharge status: Home / Self Care | Payer: BC Managed Care – PPO | Source: Ambulatory Visit

## 2020-10-18 ENCOUNTER — Encounter (HOSPITAL_BASED_OUTPATIENT_CLINIC_OR_DEPARTMENT_OTHER)
Admission: RE | Admit: 2020-10-18 | Discharge: 2020-10-18 | Disposition: A | Discharge status: Home / Self Care | Payer: BC Managed Care – PPO | Source: Ambulatory Visit | Attending: Orthopedic Surgery | Admitting: Orthopedic Surgery

## 2020-10-18 DIAGNOSIS — Z7952 Long term (current) use of systemic steroids: Secondary | ICD-10-CM | POA: Diagnosis not present

## 2020-10-18 DIAGNOSIS — M313 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: Secondary | ICD-10-CM | POA: Diagnosis not present

## 2020-10-18 DIAGNOSIS — E1169 Type 2 diabetes mellitus with other specified complication: Secondary | ICD-10-CM | POA: Diagnosis not present

## 2020-10-18 DIAGNOSIS — M869 Osteomyelitis, unspecified: Secondary | ICD-10-CM | POA: Diagnosis not present

## 2020-10-18 DIAGNOSIS — Z833 Family history of diabetes mellitus: Secondary | ICD-10-CM | POA: Diagnosis not present

## 2020-10-18 DIAGNOSIS — Z880 Allergy status to penicillin: Secondary | ICD-10-CM | POA: Diagnosis not present

## 2020-10-18 DIAGNOSIS — Z7984 Long term (current) use of oral hypoglycemic drugs: Secondary | ICD-10-CM | POA: Diagnosis not present

## 2020-10-18 DIAGNOSIS — Z20822 Contact with and (suspected) exposure to covid-19: Secondary | ICD-10-CM | POA: Diagnosis not present

## 2020-10-18 DIAGNOSIS — E11621 Type 2 diabetes mellitus with foot ulcer: Secondary | ICD-10-CM | POA: Diagnosis not present

## 2020-10-18 DIAGNOSIS — Z01812 Encounter for preprocedural laboratory examination: Secondary | ICD-10-CM | POA: Insufficient documentation

## 2020-10-18 DIAGNOSIS — L97519 Non-pressure chronic ulcer of other part of right foot with unspecified severity: Secondary | ICD-10-CM | POA: Diagnosis not present

## 2020-10-18 DIAGNOSIS — Z79899 Other long term (current) drug therapy: Secondary | ICD-10-CM | POA: Diagnosis not present

## 2020-10-18 DIAGNOSIS — Z886 Allergy status to analgesic agent status: Secondary | ICD-10-CM | POA: Diagnosis not present

## 2020-10-18 LAB — BASIC METABOLIC PANEL
Anion gap: 9 (ref 5–15)
BUN: 14 mg/dL (ref 6–20)
CO2: 28 mmol/L (ref 22–32)
Calcium: 8.7 mg/dL — ABNORMAL LOW (ref 8.9–10.3)
Chloride: 106 mmol/L (ref 98–111)
Creatinine, Ser: 1.21 mg/dL — ABNORMAL HIGH (ref 0.44–1.00)
GFR, Estimated: 53 mL/min — ABNORMAL LOW (ref >60)
Glucose, Bld: 99 mg/dL (ref 70–99)
Potassium: 3.9 mmol/L (ref 3.5–5.1)
Sodium: 143 mmol/L (ref 135–145)

## 2020-10-18 NOTE — Progress Notes (Signed)

## 2020-10-18 NOTE — Telephone Encounter (Signed)
[facility] is waiting to hear something back from [facility] to see if they can go out to see the patient before 10/21/20 and do the IV infusion. Would you like to continue to wait to see if [name] can go out to see the patient or proceed with linezolid? [name]

## 2020-10-18 NOTE — Telephone Encounter (Signed)
I spoke with [place] with [facility] and they are unable to get the patient in today for IV infusion before her COVID test. I have spoke with [facility] with [facility] and she is going to try to get the patient in for IV home infusion before her surgery that is scheduled on 10/21/20.  Patient has been informed that someone from [facility] will be reaching out to her. Simar Pothier T Pricilla Loveless

## 2020-10-18 NOTE — Telephone Encounter (Signed)
Received return call from [facility] with [facility] stating that [facility] will be going to the patient' s home on 10/20/20 to do the IV infusion for Dalbvancin . Patient has been informed and verbalized understanding. Maurisio Ruddy T Pricilla Loveless

## 2020-10-19 ENCOUNTER — Encounter (HOSPITAL_BASED_OUTPATIENT_CLINIC_OR_DEPARTMENT_OTHER): Payer: BC Managed Care – PPO | Admitting: Internal Medicine

## 2020-10-19 LAB — SARS CORONAVIRUS 2 (TAT 6-24 HRS): SARS Coronavirus 2: NEGATIVE

## 2020-10-20 ENCOUNTER — Encounter (HOSPITAL_BASED_OUTPATIENT_CLINIC_OR_DEPARTMENT_OTHER): Payer: Self-pay | Admitting: Orthopedic Surgery

## 2020-10-20 NOTE — Telephone Encounter (Signed)
Mary with [facility] called stating they were unable to get an IV started on the patient after trying 3 times and Dr. Renold Don has been informed. Dr. Renold Don states he is ok with IV not being started today.  Tekeshia Klahr T Pricilla Loveless

## 2020-10-20 NOTE — Anesthesia Preprocedure Evaluation (Addendum)
Anesthesia Evaluation  Patient identified by MRN, date of birth, ID band Patient awake    Reviewed: Allergy & Precautions, NPO status , Patient's Chart, lab work & pertinent test results  History of Anesthesia Complications Negative for: history of anesthetic complications  Airway Mallampati: III  TM Distance: >3 FB Neck ROM: Full    Dental no notable dental hx. (+) Teeth Intact   Pulmonary shortness of breath and with exertion, sleep apnea ,  weagners   Pulmonary exam normal breath sounds clear to auscultation       Cardiovascular hypertension, Pt. on medications + Peripheral Vascular Disease   Rhythm:Regular Rate:Normal - Systolic murmurs EKG 09/06/20 NSR, normal   Neuro/Psych  Headaches, PSYCHIATRIC DISORDERS Depression Chronic pain syndrome Intrathecal pump- malfunctioning Peripheral neuropathy  Neuromuscular disease (peripheral neuropathy)    GI/Hepatic Neg liver ROS, GERD  Medicated,  Endo/Other  diabetes, Well Controlled, Type 2Hypothyroidism Obesity Adrenal insufficiency Impaired fasting glucose  Renal/GU Renal disease  negative genitourinary   Musculoskeletal  (+) Arthritis , Osteoarthritis,  Wegner's Granulomatosus   Abdominal (+) + obese,   Peds negative pediatric ROS (+)  Hematology negative hematology ROS (+) anemia ,   Anesthesia Other Findings   Reproductive/Obstetrics negative OB ROS Hx/o congenital syphilis                          Anesthesia Physical  Anesthesia Plan  ASA: III  Anesthesia Plan: General   Post-op Pain Management:    Induction: Intravenous  PONV Risk Score and Plan: 3 and Ondansetron, Dexamethasone, Midazolam and Treatment may vary due to age or medical condition  Airway Management Planned: LMA  Additional Equipment:   Intra-op Plan:   Post-operative Plan: Extubation in OR  Informed Consent: I have reviewed the patients History and  Physical, chart, labs and discussed the procedure including the risks, benefits and alternatives for the proposed anesthesia with the patient or authorized representative who has indicated his/her understanding and acceptance.     Dental advisory given  Plan Discussed with: CRNA and Anesthesiologist  Anesthesia Plan Comments:        Anesthesia Quick Evaluation

## 2020-10-21 ENCOUNTER — Ambulatory Visit (HOSPITAL_BASED_OUTPATIENT_CLINIC_OR_DEPARTMENT_OTHER)
Admission: RE | Admit: 2020-10-21 | Discharge: 2020-10-21 | Disposition: A | Discharge status: Home / Self Care | Payer: BC Managed Care – PPO | Source: Ambulatory Visit | Attending: Orthopedic Surgery | Admitting: Orthopedic Surgery

## 2020-10-21 ENCOUNTER — Other Ambulatory Visit: Payer: Self-pay

## 2020-10-21 ENCOUNTER — Encounter (HOSPITAL_BASED_OUTPATIENT_CLINIC_OR_DEPARTMENT_OTHER): Payer: Self-pay | Admitting: Orthopedic Surgery

## 2020-10-21 ENCOUNTER — Telehealth: Payer: Self-pay | Admitting: *Deleted

## 2020-10-21 ENCOUNTER — Encounter (HOSPITAL_BASED_OUTPATIENT_CLINIC_OR_DEPARTMENT_OTHER)
Admission: RE | Disposition: A | Discharge status: Home / Self Care | Payer: Self-pay | Source: Ambulatory Visit | Attending: Orthopedic Surgery

## 2020-10-21 ENCOUNTER — Ambulatory Visit (HOSPITAL_BASED_OUTPATIENT_CLINIC_OR_DEPARTMENT_OTHER): Payer: BC Managed Care – PPO | Admitting: Certified Registered"

## 2020-10-21 DIAGNOSIS — L97519 Non-pressure chronic ulcer of other part of right foot with unspecified severity: Secondary | ICD-10-CM | POA: Insufficient documentation

## 2020-10-21 DIAGNOSIS — Z833 Family history of diabetes mellitus: Secondary | ICD-10-CM | POA: Insufficient documentation

## 2020-10-21 DIAGNOSIS — E1169 Type 2 diabetes mellitus with other specified complication: Secondary | ICD-10-CM | POA: Insufficient documentation

## 2020-10-21 DIAGNOSIS — Z7984 Long term (current) use of oral hypoglycemic drugs: Secondary | ICD-10-CM | POA: Insufficient documentation

## 2020-10-21 DIAGNOSIS — Z79899 Other long term (current) drug therapy: Secondary | ICD-10-CM | POA: Insufficient documentation

## 2020-10-21 DIAGNOSIS — Z20822 Contact with and (suspected) exposure to covid-19: Secondary | ICD-10-CM | POA: Insufficient documentation

## 2020-10-21 DIAGNOSIS — Z886 Allergy status to analgesic agent status: Secondary | ICD-10-CM | POA: Insufficient documentation

## 2020-10-21 DIAGNOSIS — M313 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: Secondary | ICD-10-CM | POA: Insufficient documentation

## 2020-10-21 DIAGNOSIS — L97524 Non-pressure chronic ulcer of other part of left foot with necrosis of bone: Secondary | ICD-10-CM

## 2020-10-21 DIAGNOSIS — M869 Osteomyelitis, unspecified: Secondary | ICD-10-CM

## 2020-10-21 DIAGNOSIS — Z880 Allergy status to penicillin: Secondary | ICD-10-CM | POA: Insufficient documentation

## 2020-10-21 DIAGNOSIS — E11621 Type 2 diabetes mellitus with foot ulcer: Secondary | ICD-10-CM | POA: Insufficient documentation

## 2020-10-21 DIAGNOSIS — Z7952 Long term (current) use of systemic steroids: Secondary | ICD-10-CM | POA: Insufficient documentation

## 2020-10-21 HISTORY — PX: TRANSMETATARSAL AMPUTATION: SHX6197

## 2020-10-21 HISTORY — PX: AMPUTATION: SHX166

## 2020-10-21 LAB — GLUCOSE, CAPILLARY
Glucose-Capillary: 106 mg/dL — ABNORMAL HIGH (ref 70–99)
Glucose-Capillary: 115 mg/dL — ABNORMAL HIGH (ref 70–99)

## 2020-10-21 SURGERY — AMPUTATION, FOOT, TRANSMETATARSAL
Anesthesia: General | Site: Foot | Laterality: Right

## 2020-10-21 MED ORDER — OXYCODONE HCL 5 MG/5ML PO SOLN: 5.0000 mg | Freq: Once | ORAL | Status: AC | PRN

## 2020-10-21 MED ORDER — FENTANYL CITRATE (PF) 100 MCG/2ML IJ SOLN
INTRAMUSCULAR | Status: AC
  Filled 2020-10-21: qty 2

## 2020-10-21 MED ORDER — PROPOFOL 10 MG/ML IV BOLUS
INTRAVENOUS | Status: AC
  Filled 2020-10-21: qty 20

## 2020-10-21 MED ORDER — HYDROMORPHONE HCL 1 MG/ML IJ SOLN
INTRAMUSCULAR | Status: AC
  Filled 2020-10-21: qty 0.5

## 2020-10-21 MED ORDER — MIDAZOLAM HCL 2 MG/2ML IJ SOLN
INTRAMUSCULAR | Status: AC
  Filled 2020-10-21: qty 2

## 2020-10-21 MED ORDER — FENTANYL CITRATE (PF) 100 MCG/2ML IJ SOLN
INTRAMUSCULAR | Status: DC | PRN
  Administered 2020-10-21 (×2): 25 ug via INTRAVENOUS

## 2020-10-21 MED ORDER — OXYCODONE HCL 5 MG PO TABS
ORAL_TABLET | ORAL | Status: AC
  Filled 2020-10-21: qty 1

## 2020-10-21 MED ORDER — EPHEDRINE SULFATE 50 MG/ML IJ SOLN
INTRAMUSCULAR | Status: DC | PRN
  Administered 2020-10-21 (×2): 10 mg via INTRAVENOUS
  Administered 2020-10-21: 15 mg via INTRAVENOUS

## 2020-10-21 MED ORDER — PHENYLEPHRINE 40 MCG/ML (10ML) SYRINGE FOR IV PUSH (FOR BLOOD PRESSURE SUPPORT)
PREFILLED_SYRINGE | INTRAVENOUS | Status: AC
  Filled 2020-10-21: qty 10

## 2020-10-21 MED ORDER — PROPOFOL 10 MG/ML IV BOLUS
INTRAVENOUS | Status: DC | PRN
  Administered 2020-10-21: 140 mg via INTRAVENOUS

## 2020-10-21 MED ORDER — ONDANSETRON HCL 4 MG/2ML IJ SOLN
INTRAMUSCULAR | Status: AC
  Filled 2020-10-21: qty 2

## 2020-10-21 MED ORDER — SODIUM CHLORIDE 0.9 % IV SOLN: INTRAVENOUS | Status: DC

## 2020-10-21 MED ORDER — 0.9 % SODIUM CHLORIDE (POUR BTL) OPTIME
TOPICAL | Status: DC | PRN
  Administered 2020-10-21: 300 mL

## 2020-10-21 MED ORDER — OXYCODONE HCL 5 MG PO TABS
5.0000 mg | ORAL_TABLET | Freq: Once | ORAL | Status: AC | PRN
  Administered 2020-10-21: 5 mg via ORAL

## 2020-10-21 MED ORDER — BUPIVACAINE-EPINEPHRINE (PF) 0.5% -1:200000 IJ SOLN
INTRAMUSCULAR | Status: AC
  Filled 2020-10-21: qty 60

## 2020-10-21 MED ORDER — DEXAMETHASONE SODIUM PHOSPHATE 10 MG/ML IJ SOLN
INTRAMUSCULAR | Status: AC
  Filled 2020-10-21: qty 1

## 2020-10-21 MED ORDER — HYDROMORPHONE HCL 1 MG/ML IJ SOLN
0.2500 mg | INTRAMUSCULAR | Status: DC | PRN
  Administered 2020-10-21: 0.25 mg via INTRAVENOUS

## 2020-10-21 MED ORDER — PHENYLEPHRINE HCL (PRESSORS) 10 MG/ML IV SOLN
INTRAVENOUS | Status: DC | PRN
  Administered 2020-10-21: 40 ug via INTRAVENOUS
  Administered 2020-10-21 (×2): 80 ug via INTRAVENOUS
  Administered 2020-10-21 (×2): 40 ug via INTRAVENOUS
  Administered 2020-10-21: 80 ug via INTRAVENOUS

## 2020-10-21 MED ORDER — LACTATED RINGERS IV SOLN: INTRAVENOUS | Status: DC

## 2020-10-21 MED ORDER — HYDROCORTISONE NA SUCCINATE PF 100 MG IJ SOLR
INTRAMUSCULAR | Status: AC
  Filled 2020-10-21: qty 2

## 2020-10-21 MED ORDER — CEFAZOLIN SODIUM-DEXTROSE 2-4 GM/100ML-% IV SOLN
INTRAVENOUS | Status: AC
  Filled 2020-10-21: qty 100

## 2020-10-21 MED ORDER — ONDANSETRON HCL 4 MG/2ML IJ SOLN
4.0000 mg | Freq: Once | INTRAMUSCULAR | Status: AC | PRN
  Administered 2020-10-21: 4 mg via INTRAVENOUS

## 2020-10-21 MED ORDER — BUPIVACAINE-EPINEPHRINE 0.5% -1:200000 IJ SOLN
INTRAMUSCULAR | Status: DC | PRN
  Administered 2020-10-21: 20 mL

## 2020-10-21 MED ORDER — VANCOMYCIN HCL 500 MG IV SOLR
INTRAVENOUS | Status: AC
  Filled 2020-10-21: qty 2000

## 2020-10-21 MED ORDER — MIDAZOLAM HCL 5 MG/5ML IJ SOLN
INTRAMUSCULAR | Status: DC | PRN
  Administered 2020-10-21: 2 mg via INTRAVENOUS

## 2020-10-21 MED ORDER — EPHEDRINE 5 MG/ML INJ
INTRAVENOUS | Status: AC
  Filled 2020-10-21: qty 10

## 2020-10-21 MED ORDER — LIDOCAINE HCL 2 % IJ SOLN
INTRAMUSCULAR | Status: AC
  Filled 2020-10-21: qty 40

## 2020-10-21 MED ORDER — HYDROCORTISONE NA SUCCINATE PF 100 MG IJ SOLR
INTRAMUSCULAR | Status: DC | PRN
  Administered 2020-10-21: 50 mg via INTRAVENOUS

## 2020-10-21 MED ORDER — OXYCODONE HCL 5 MG PO TABS: 5.0000 mg | ORAL_TABLET | Freq: Four times a day (QID) | ORAL | 0 refills | Status: AC | PRN

## 2020-10-21 MED ORDER — LIDOCAINE HCL (CARDIAC) PF 100 MG/5ML IV SOSY
PREFILLED_SYRINGE | INTRAVENOUS | Status: DC | PRN
  Administered 2020-10-21: 60 mg via INTRAVENOUS

## 2020-10-21 MED ORDER — LIDOCAINE 2% (20 MG/ML) 5 ML SYRINGE
INTRAMUSCULAR | Status: AC
  Filled 2020-10-21: qty 5

## 2020-10-21 MED ORDER — CEFAZOLIN SODIUM-DEXTROSE 2-4 GM/100ML-% IV SOLN
2.0000 g | INTRAVENOUS | Status: AC
  Administered 2020-10-21: 2 g via INTRAVENOUS

## 2020-10-21 MED ORDER — VANCOMYCIN HCL 500 MG IV SOLR
INTRAVENOUS | Status: DC | PRN
  Administered 2020-10-21: 500 mg via TOPICAL

## 2020-10-21 SURGICAL SUPPLY — 69 items
APL PRP STRL LF DISP 70% ISPRP (MISCELLANEOUS)
BLADE AVERAGE 25X9 (BLADE) IMPLANT
BLADE MICRO SAGITTAL (BLADE) ×3 IMPLANT
BLADE OSC/SAG .038X5.5 CUT EDG (BLADE) IMPLANT
BLADE SURG 10 STRL SS (BLADE) ×6 IMPLANT
BLADE SURG 15 STRL LF DISP TIS (BLADE) ×4 IMPLANT
BLADE SURG 15 STRL SS (BLADE) ×6
BNDG CMPR 9X4 STRL LF SNTH (GAUZE/BANDAGES/DRESSINGS) ×2
BNDG COHESIVE 4X5 TAN STRL (GAUZE/BANDAGES/DRESSINGS) ×3 IMPLANT
BNDG CONFORM 3 STRL LF (GAUZE/BANDAGES/DRESSINGS) ×6 IMPLANT
BNDG ELASTIC 4X5.8 VLCR STR LF (GAUZE/BANDAGES/DRESSINGS) ×3 IMPLANT
BNDG ESMARK 4X9 LF (GAUZE/BANDAGES/DRESSINGS) ×3 IMPLANT
BOOT STEPPER DURA SM (SOFTGOODS) ×3 IMPLANT
BRUSH SCRUB EZ PLAIN DRY (MISCELLANEOUS) ×6 IMPLANT
CHLORAPREP W/TINT 26 (MISCELLANEOUS) IMPLANT
COVER BACK TABLE 60X90IN (DRAPES) ×3 IMPLANT
COVER WAND RF STERILE (DRAPES) IMPLANT
DECANTER SPIKE VIAL GLASS SM (MISCELLANEOUS) IMPLANT
DRAPE BILATERAL LIMB T (DRAPES) ×3 IMPLANT
DRAPE EXTREMITY T 121X128X90 (DISPOSABLE) IMPLANT
DRAPE OEC MINIVIEW 54X84 (DRAPES) IMPLANT
DRAPE SURG 17X23 STRL (DRAPES) IMPLANT
DRAPE U-SHAPE 47X51 STRL (DRAPES) ×6 IMPLANT
DRSG MEPITEL 4X7.2 (GAUZE/BANDAGES/DRESSINGS) ×6 IMPLANT
DRSG PAD ABDOMINAL 8X10 ST (GAUZE/BANDAGES/DRESSINGS) ×6 IMPLANT
ELECT REM PT RETURN 9FT ADLT (ELECTROSURGICAL) ×3
ELECTRODE REM PT RTRN 9FT ADLT (ELECTROSURGICAL) ×2 IMPLANT
GAUZE SPONGE 4X4 12PLY STRL (GAUZE/BANDAGES/DRESSINGS) ×6 IMPLANT
GLOVE BIO SURGEON STRL SZ8 (GLOVE) ×6 IMPLANT
GLOVE BIOGEL PI IND STRL 7.0 (GLOVE) ×2 IMPLANT
GLOVE BIOGEL PI INDICATOR 7.0 (GLOVE) ×1
GLOVE ECLIPSE 8.0 STRL XLNG CF (GLOVE) ×3 IMPLANT
GLOVE SRG 8 PF TXTR STRL LF DI (GLOVE) ×4 IMPLANT
GLOVE SURG SS PI 7.0 STRL IVOR (GLOVE) ×9 IMPLANT
GLOVE SURG UNDER POLY LF SZ7 (GLOVE) ×3 IMPLANT
GLOVE SURG UNDER POLY LF SZ8 (GLOVE) ×6
GOWN STRL REUS W/ TWL LRG LVL3 (GOWN DISPOSABLE) ×2 IMPLANT
GOWN STRL REUS W/ TWL XL LVL3 (GOWN DISPOSABLE) ×4 IMPLANT
GOWN STRL REUS W/TWL LRG LVL3 (GOWN DISPOSABLE) ×3
GOWN STRL REUS W/TWL XL LVL3 (GOWN DISPOSABLE) ×6
NDL SAFETY ECLIPSE 18X1.5 (NEEDLE) IMPLANT
NEEDLE HYPO 18GX1.5 SHARP (NEEDLE)
NEEDLE HYPO 25X1 1.5 SAFETY (NEEDLE) ×3 IMPLANT
NS IRRIG 1000ML POUR BTL (IV SOLUTION) ×3 IMPLANT
PACK BASIN DAY SURGERY FS (CUSTOM PROCEDURE TRAY) ×3 IMPLANT
PAD CAST 4YDX4 CTTN HI CHSV (CAST SUPPLIES) ×4 IMPLANT
PADDING CAST COTTON 4X4 STRL (CAST SUPPLIES) ×6
PENCIL SMOKE EVACUATOR (MISCELLANEOUS) ×3 IMPLANT
SANITIZER HAND PURELL 535ML FO (MISCELLANEOUS) IMPLANT
SHEET MEDIUM DRAPE 40X70 STRL (DRAPES) ×3 IMPLANT
SLEEVE SCD COMPRESS KNEE MED (MISCELLANEOUS) IMPLANT
SPONGE LAP 18X18 RF (DISPOSABLE) ×6 IMPLANT
STOCKINETTE 6  STRL (DRAPES) ×6
STOCKINETTE 6 STRL (DRAPES) ×4 IMPLANT
SUCTION FRAZIER HANDLE 10FR (MISCELLANEOUS) ×3
SUCTION TUBE FRAZIER 10FR DISP (MISCELLANEOUS) ×2 IMPLANT
SUT ETHILON 2 0 FS 18 (SUTURE) ×12 IMPLANT
SUT ETHILON 2 0 FSLX (SUTURE) IMPLANT
SUT ETHILON 3 0 PS 1 (SUTURE) IMPLANT
SUT MNCRL AB 3-0 PS2 18 (SUTURE) IMPLANT
SUT PDS AB 0 CT 36 (SUTURE) IMPLANT
SUT PDS AB 2-0 CT2 27 (SUTURE) IMPLANT
SWAB COLLECTION DEVICE MRSA (MISCELLANEOUS) IMPLANT
SYR BULB EAR ULCER 3OZ GRN STR (SYRINGE) ×3 IMPLANT
SYR CONTROL 10ML LL (SYRINGE) ×3 IMPLANT
TOWEL GREEN STERILE FF (TOWEL DISPOSABLE) ×6 IMPLANT
TRAY DSU PREP LF (CUSTOM PROCEDURE TRAY) ×3 IMPLANT
TUBE CONNECTING 20X1/4 (TUBING) ×3 IMPLANT
UNDERPAD 30X36 HEAVY ABSORB (UNDERPADS AND DIAPERS) ×6 IMPLANT

## 2020-10-21 NOTE — Discharge Instructions (Signed)
Adin Lariccia, MD [facility]  Please read the following information regarding your care after surgery.  Medications  You only need a prescription for the narcotic pain medicine (ex. oxycodone, Percocet, Norco).  All of the other medicines listed below are available over the counter. ? Aleve 2 pills twice a day for the first 3 days after surgery. X acetominophen (Tylenol) 650 mg every 4-6 hours as you need for minor to moderate pain ? oxycodone as prescribed for severe pain  Narcotic pain medicine (ex. oxycodone, Percocet, Vicodin) will cause constipation.  To prevent this problem, take the following medicines while you are taking any pain medicine. ? docusate sodium (Colace) 100 mg twice a day ? senna (Senokot) 2 tablets twice a day  ? To help prevent blood clots, take a baby aspirin (81 mg) twice a day for two weeks after surgery.  You should also get up every hour while you are awake to move around.    Weight Bearing ? Bear weight when you are able on your operated leg or foot. X Bear weight only on your operated foot in the post-op shoe. ? Do not bear any weight on the operated leg or foot.  Cast / Splint / Dressing X Keep your splint, cast or dressing clean and dry.  Don't put anything (coat hanger, pencil, etc) down inside of it.  If it gets damp, use a hair dryer on the cool setting to dry it.  If it gets soaked, call the office to schedule an appointment for a cast change. ? Remove your dressing 3 days after surgery and cover the incisions with dry dressings.    After your dressing, cast or splint is removed; you may shower, but do not soak or scrub the wound.  Allow the water to run over it, and then gently pat it dry.  Swelling It is normal for you to have swelling where you had surgery.  To reduce swelling and pain, keep your toes above your nose for at least 3 days after surgery.  It may be necessary to keep your foot or leg elevated for several weeks.  If it hurts, it should be  elevated.  Follow Up Call my office at [phone] when you are discharged from the hospital or surgery center to schedule an appointment to be seen two weeks after surgery.  Call my office at [phone] if you develop a fever >101.5 F, nausea, vomiting, bleeding from the surgical site or severe pain.     Post Anesthesia Home Care Instructions  Activity: Get plenty of rest for the remainder of the day. A responsible individual must stay with you for 24 hours following the procedure.  For the next 24 hours, DO NOT: -Drive a car -Operate machinery -Drink alcoholic beverages -Take any medication unless instructed by your physician -Make any legal decisions or sign important papers.  Meals: Start with liquid foods such as gelatin or soup. Progress to regular foods as tolerated. Avoid greasy, spicy, heavy foods. If nausea and/or vomiting occur, drink only clear liquids until the nausea and/or vomiting subsides. Call your physician if vomiting continues.  Special Instructions/Symptoms: Your throat may feel dry or sore from the anesthesia or the breathing tube placed in your throat during surgery. If this causes discomfort, gargle with warm salt water. The discomfort should disappear within 24 hours.  If you had a scopolamine patch placed behind your ear for the management of post- operative nausea and/or vomiting:  1. The medication in the patch is   effective for 72 hours, after which it should be removed.  Wrap patch in a tissue and discard in the trash. Wash hands thoroughly with soap and water. 2. You may remove the patch earlier than 72 hours if you experience unpleasant side effects which may include dry mouth, dizziness or visual disturbances. 3. Avoid touching the patch. Wash your hands with soap and water after contact with the patch.     

## 2020-10-21 NOTE — Telephone Encounter (Signed)
Drug-drug interaction between Cymbalta and Linezolid - please advise. Patient does have cipro through 11/04/20. Clinic will be closed 12/31.  Andree Coss, RN

## 2020-10-21 NOTE — Telephone Encounter (Signed)
If she is in the hospital, please have her seen by the id team.... They can review what is done and see if ahe needa further abx....  If i was to base everything from her outpatient result previosulys i would give her 1 week linezolid 600 mg bid and cipro 500 mg bid

## 2020-10-21 NOTE — Anesthesia Procedure Notes (Signed)
Procedure Name: LMA Insertion Date/Time: 10/21/2020 7:36 AM Performed by: Lauralyn Primes, CRNA Pre-anesthesia Checklist: Patient identified, Emergency Drugs available, Suction available and Patient being monitored Patient Re-evaluated:Patient Re-evaluated prior to induction Oxygen Delivery Method: Circle system utilized Preoxygenation: Pre-oxygenation with 100% oxygen Induction Type: IV induction Ventilation: Mask ventilation without difficulty LMA: LMA inserted LMA Size: 4.0 Number of attempts: 1 Airway Equipment and Method: Bite block Placement Confirmation: positive ETCO2 Tube secured with: Tape Dental Injury: Teeth and Oropharynx as per pre-operative assessment

## 2020-10-21 NOTE — Telephone Encounter (Signed)
Home health unable to get IV access to infuse dalbavancin prior to surgery. Patient's husband Hessie Diener brought the iv medication with them to surgery, but surgical team unable to administer outside medication during procedure. Hessie Diener brought it home and placed it back in the refrigerator. She has been on oral cipro prescribed by Dr Renold Don and her preventative bactrim ds three times weekly. Please advise if home health should go back out and attempt another IV for infusion or if this antibiotic is no longer needed.  Andree Coss, RN

## 2020-10-21 NOTE — Transfer of Care (Signed)
Immediate Anesthesia Transfer of Care Note  Patient: [name]  Procedure(s) Performed: Left transmetatarsal amputation (Bilateral Foot) Right hallux amputation (Right Foot)  Patient Location: PACU  Anesthesia Type:General  Level of Consciousness: drowsy  Airway & Oxygen Therapy: Patient Spontanous Breathing and Patient connected to face mask oxygen  Post-op Assessment: Report given to RN and Post -op Vital signs reviewed and stable  Post vital signs: Reviewed and stable  Last Vitals:  Vitals Value Taken Time  BP 91/57 10/21/20 0839  Temp    Pulse 72 10/21/20 0844  Resp 18 10/21/20 0844  SpO2 96 % 10/21/20 0844  Vitals shown include unvalidated device data.  Last Pain:  Vitals:   10/21/20 0654  TempSrc: Oral  PainSc: 5          Complications: No complications documented.

## 2020-10-21 NOTE — H&P (Signed)
[name] is an 54 y.o. female.   Chief Complaint:  bilat forefoot ulcers HPI: The patient is a 54 year old woman with a past medical history significant for Wegener's granulomatosis and diabetes.  She is about 2-1/2 months out from right third toe amputation and left hallux and third toe amputations.  She has developed a new ulcer at the right hallux unrelated to her previous right third toe amputation as well as a lateral forefoot ulcer on the left unrelated to her previous left hallux and third toe amputations.  These are new diagnoses in the postoperative period.  She presents today for left transmetatarsal amputation and right hallux amputation.  Past Medical History:  Diagnosis Date  . Chronic cough   . Chronic pain   . Diabetes mellitus without complication (HCC)   . Dyspnea   . GERD (gastroesophageal reflux disease)   . Hypokalemia   . Hypothyroidism   . Neurocardiogenic syncope   . OSA (obstructive sleep apnea)    does not use CPAP  . Osteomyelitis (HCC)    bilateral feet  . Peripheral neuropathy   . Presence of intrathecal pump    recieves Prialt/bupivicaine  . Wegener's granulomatosis 2009  . Wegner's disease (congenital syphilitic osteochondritis)     Past Surgical History:  Procedure Laterality Date  . AMPUTATION TOE Bilateral 08/12/2020   Procedure: Right 3rd toe amputation; left hallux and 3rd toe amputation;  Surgeon: Toni Arthurs, MD;  Location: [facility];  Service: Orthopedics;  Laterality: Bilateral;   . APPENDECTOMY    . BACK SURGERY    . CHOLECYSTECTOMY    . COLONOSCOPY N/A 06/02/2013   Procedure: COLONOSCOPY;  Surgeon: Graylin Shiver, MD;  Location: [name];  Service: Endoscopy;  Laterality: N/A;  . HERNIA REPAIR  2000  . INCISION AND DRAINAGE ABSCESS Left 07/07/2016   Procedure: DEBRIDMENT LEFT THIGH ABSCESS, EXISION ACUTE SKIN RASH LEFT THIGH(1CM LESION);  Surgeon: Claud Kelp, MD;  Location: [facility];  Service: General;   Laterality: Left;  . SEPTOPLASTY  02/2013  . spleen anuyism    . splenic aneurysm    . VAGINAL HYSTERECTOMY      Family History  Problem Relation Age of Onset  . Diabetes Mother   . Heart disease Mother   . Diabetes Father    Social History:  reports that she has never smoked. She has never used smokeless tobacco. She reports current alcohol use. She reports that she does not use drugs.  Allergies:  Allergies  Allergen Reactions  . Ibuprofen Other (See Comments)    Pt is unable to take this due to kidney problems.   Other reaction(s): Other Not to take with other medications   . Penicillins Rash and Other (See Comments)    Has patient had a PCN reaction causing immediate rash, facial/tongue/throat swelling, SOB or lightheadedness with hypotension: No Has patient had a PCN reaction causing severe rash involving mucus membranes or skin necrosis: No Has patient had a PCN reaction that required hospitalization No Has patient had a PCN reaction occurring within the last 10 years: No If all of the above answers are "NO", then may proceed with Cephalosporin use.    Medications Prior to Admission  Medication Sig Dispense Refill  . acetaminophen (TYLENOL) 325 MG tablet Take 2 tablets (650 mg total) by mouth every 6 (six) hours as needed for mild pain (or Fever >/= 101).    Marland Kitchen albuterol (PROVENTIL HFA;VENTOLIN HFA) 108 (90 Base) MCG/ACT inhaler Inhale 2 puffs  into the lungs every 6 (six) hours as needed for wheezing or shortness of breath.    Marland Kitchen b complex vitamins tablet Take 1 tablet by mouth daily.    . Calcium Carb-Cholecalciferol (CALCIUM CARBONATE-VITAMIN D3 PO) Take 1 tablet by mouth daily.    . ciprofloxacin (CIPRO) 500 MG tablet Take 1 tablet (500 mg total) by mouth in the morning and at bedtime for 21 days. 42 tablet 0  . DULoxetine (CYMBALTA) 60 MG capsule Take 60 mg by mouth 2 (two) times daily.    . famotidine (PEPCID) 20 MG tablet Take 20 mg daily    . ferrous sulfate 325 (65  FE) MG tablet Take 325 mg by mouth daily with breakfast.    . fluticasone (FLONASE) 50 MCG/ACT nasal spray Place 2 sprays into both nostrils daily as needed for rhinitis.    . folic acid (FOLVITE) 1 MG tablet Take 1 mg by mouth daily.    . furosemide (LASIX) 40 MG tablet Take 1 tablet (40 mg total) by mouth as directed. 90 tablet 3  . gabapentin (NEURONTIN) 300 MG capsule Take 3 capsules (900 mg total) by mouth in the morning, 2 capsules (600 mg total) in the early afternoon, and 4 capsules (1200 mg total) in the evening.    . lidocaine (LIDODERM) 5 % Place 1 patch onto the skin daily as needed (for pain). Remove & Discard patch within 12 hours or as directed by MD    . magnesium oxide (MAG-OX) 400 MG tablet Take 400 mg by mouth 2 (two) times daily.    . metFORMIN (GLUCOPHAGE) 500 MG tablet Take 500 mg by mouth 2 (two) times daily with a meal.    . methotrexate 50 MG/2ML injection Inject 1 mL into the skin once a week.     . montelukast (SINGULAIR) 10 MG tablet Take 10 mg by mouth daily.  3  . Potassium Chloride Crys ER (K-DUR PO) Take 40 mEq by mouth 3 (three) times daily.    . predniSONE (DELTASONE) 5 MG tablet Take 20 mg by mouth daily with breakfast.     . sulfamethoxazole-trimethoprim (BACTRIM,SEPTRA) 400-80 MG tablet TAKE 1 TABLET 3 TIMES A WEEK  3  . tapentadol (NUCYNTA) 50 MG tablet Take 50 mg by mouth 2 (two) times daily.    . tizanidine (ZANAFLEX) 2 MG capsule TAKE ONE CAPSULE BY MOUTH 3 TIMES A DAY AS NEEDED FOR MUSCLE SPASMS    . topiramate (TOPAMAX) 100 MG tablet Take 100 mg by mouth daily.    . traZODone (DESYREL) 100 MG tablet Take 1 tablet (100 mg total) by mouth at bedtime.    . vitamin B-12 (CYANOCOBALAMIN) 1000 MCG tablet Take 1,000 mcg by mouth daily.    . Vitamin D, Ergocalciferol, (DRISDOL) 50000 units CAPS capsule Take 50,000 Units by mouth every 7 (seven) days. Pt takes on Friday.    Marland Kitchen PRESCRIPTION MEDICATION by Intrathecal route as directed. ziconotide (PF) 25 mcg/mL,  BUPivacaine (PF) 18 mg/mL 20 mL intrathecal pump refill    . ZICONOTIDE ACETATE IT by Intrathecal route every 3 (three) months.      Results for orders placed or performed during the hospital encounter of 10/21/20 (from the past 48 hour(s))  Glucose, capillary     Status: Abnormal   Collection Time: 10/21/20  6:50 AM  Result Value Ref Range   Glucose-Capillary 106 (H) 70 - 99 mg/dL    Comment: Glucose reference range applies only to samples taken after fasting for at least  8 hours.   No results found.  Review of Systems no recent fever, chills, nausea, vomiting or changes in her appetite.  Blood pressure 133/80, pulse 76, temperature (S) (!) 101.7 F (38.7 C), temperature source Oral, resp. rate 19, height 5\' 6"  (1.676 m), weight 91.3 kg, SpO2 97 %. Physical Exam  Well-nourished well-developed woman in no apparent distress.  Alert and oriented x4.  Normal mood and affect.  Gait is flatfoot flatfoot bilaterally.  The left foot has a lateral forefoot ulcer along the fifth ray.  There is bone exposed in this area.  Pulses are palpable in the foot.  Intact sensibility to light touch around hindfoot and ankle.  Heel cord is slightly tight.  The right hallux has an ulcer dorsally.  There is no evident granulation tissue.  There is fibrinous exudate.  No evidence of cellulitis in either foot.  Assessment/Plan Nonhealing ulcers of the right hallux and the left fifth ray.  To the operating room today for left transmetatarsal versus fifth ray amputation and right hallux amputation.  The risks and benefits of the alternative treatment options have been discussed in detail.  The patient wishes to proceed with surgery and specifically understands risks of bleeding, infection, nerve damage, blood clots, need for additional surgery, amputation and death.   , MD 11/20/2020, 7:00 AM

## 2020-10-21 NOTE — Op Note (Signed)
10/21/2020  8:47 AM  PATIENT:  [name]  54 y.o. female  PRE-OPERATIVE DIAGNOSIS: 1.  Left 5th ray osteomyelitis and diabetic ulcer      2.  Short left achilles tendon      3.  R hallux nonhealing diabetic ulcer  POST-OPERATIVE DIAGNOSIS:  Same  Procedure(s): 1.  Percutaneous left achilles tendon lengthening 2.  Left transmetatarsal amputation 3.  Right hallux amputation through the proximal phalanx  SURGEON:  Toni Arthurs, MD  ASSISTANT: none  ANESTHESIA:   General  EBL:  minimal   TOURNIQUET:   Total Tourniquet Time Documented: Thigh (Left) - 29 minutes Total: Thigh (Left) - 29 minutes  Leg (Right) - 11 minutes Total: Leg (Right) - 11 minutes  COMPLICATIONS:  None apparent  DISPOSITION:  Extubated, awake and stable to recovery.  INDICATION FOR PROCEDURE: The patient is a 54 year old female with a past medical history significant for diabetes and Wegener's granulomatosis.  She has a large ulcer at the lateral left forefoot with underlying osteomyelitis that has not healed despite extensive wound care.  She also has a nonhealing ulcer of the right hallux that has again not healed with extensive wound care.  She presents now for operative treatment of these nonhealing diabetic foot ulcers and osteomyelitis.  The risks and benefits of the alternative treatment options have been discussed in detail.  The patient wishes to proceed with surgery and specifically understands risks of bleeding, infection, nerve damage, blood clots, need for additional surgery, amputation and death.  PROCEDURE IN DETAIL: After preoperative consent was obtained and the correct operative site was identified, the patient was brought to the operating room and placed upon the operating table.  General anesthesia was induced as well as IV antibiotics administered.  Surgical timeout was taken.  Both lower extremities were prepped and draped in standard sterile fashion.  The left lower extremity was elevated  and the thigh tourniquet inflated to 250 mmHg.  A fishmouth incision was marked around the forefoot taking care to completely excise the ulcer laterally.  The incision was then made and sharp dissection carried down through the subcutaneous tissues to the level of the bone.  Subperiosteal dissection was then carried dorsally along the metatarsals.  An oscillating saw was used to cut through all 5 metatarsals resecting all distal bone.  The plantar flap was then contoured sharply.  The forefoot was passed off the field.  The wound was irrigated copiously.  Vascular structures were cauterized.  The wound was sprinkled with vancomycin powder.  The incision was closed with simple and horizontal mattress sutures of 2-0 Vicryl.  The plantar flap was rotated laterally to fill in the defect from the ulcer.  The wounds were dressed in sterile fashion, and the tourniquet was released.  Attention was turned to the right foot.  A fishmouth incision was made around the base of the proximal phalanx of the right hallux.  The incision was made and sharp dissection was carried down through the subcutaneous tissues.  The proximal phalanx was then cut with an oscillating saw beveling the cut appropriately.  The wound was irrigated copiously.  Vascular structures were cauterized.  Vancomycin was sprinkled in the wound.  The incision was closed with simple and horizontal mattress sutures of 2-0 nylon.  Sterile dressings were applied followed by a compression wrap.  Both wounds were infiltrated with Marcaine with epinephrine for postoperative pain control.  Patient was awakened from anesthesia and transported to the recovery room in stable condition.  FOLLOW UP PLAN: Weightbearing as tolerated on the left lower extremity in a cam boot and on the right in a flat postop shoe.  Follow-up in the office in 2 weeks for suture removal.  Continue antibiotics per primary care.

## 2020-10-21 NOTE — Anesthesia Postprocedure Evaluation (Signed)
Anesthesia Post Note  Patient: [name]  Procedure(s) Performed: Left transmetatarsal amputation (Bilateral Foot) Right hallux amputation (Right Foot)     Patient location during evaluation: PACU Anesthesia Type: General Level of consciousness: awake and alert and oriented Pain management: pain level controlled Vital Signs Assessment: post-procedure vital signs reviewed and stable Respiratory status: spontaneous breathing, nonlabored ventilation and respiratory function stable Cardiovascular status: blood pressure returned to baseline and stable Postop Assessment: no apparent nausea or vomiting Anesthetic complications: no   No complications documented.  Last Vitals:  Vitals:   10/21/20 0900 10/21/20 0915  BP: 94/61 99/65  Pulse: 71 70  Resp: 16 17  Temp:    SpO2: 94% 98%    Last Pain:  Vitals:   10/21/20 0900  TempSrc:   PainSc: Asleep                 Callen Vancuren A.

## 2020-10-22 NOTE — Telephone Encounter (Signed)
Called and let her know should be ok to take. Incidence is low and readily reversible within hours and she really has no alternative outside of that and IV  She is agreeable and has no question. 7 day cipro/linezolid  Advised that she can follow up with [place] in around 7 days.   Thanks all for your help

## 2020-10-25 ENCOUNTER — Encounter (HOSPITAL_BASED_OUTPATIENT_CLINIC_OR_DEPARTMENT_OTHER): Payer: Self-pay | Admitting: Orthopedic Surgery

## 2020-10-26 MED ORDER — LINEZOLID 600 MG PO TABS: 600.0000 mg | ORAL_TABLET | Freq: Two times a day (BID) | ORAL | 0 refills | Status: DC

## 2020-10-26 NOTE — Addendum Note (Signed)
Addended by: Andree Coss on: 10/26/2020 12:42 PM   Modules accepted: Orders

## 2020-10-26 NOTE — Addendum Note (Signed)
Addended by: Rutha Bouchard T on: 10/26/2020 04:07 PM   Modules accepted: Orders

## 2020-10-28 NOTE — Telephone Encounter (Signed)
Scheduled patient for follow up 1/19, 1:45. She states she is still on bedrest from surgery. She reports a cough with fever (up to 102) that started last week after her covid screen on 12/27. She still has the cough, is asking for cough syrup. Her PCP denied a refill on the cough syrup with codeine, as she still has post-op pain medication. She asked if Dr Renold Don would consider prescribing this cough syrup for her - RN advised we defer to primary care for acute needs like this.  Since patient is on bedrest and does not want to leave the house, RN asked her to see if they can find an at-home Covid 19 swab to see if she is covid positive.  Will follow to schedule pre-visit labs based on covid result and her mobility status.

## 2020-10-28 NOTE — Telephone Encounter (Signed)
Relayed Dr Orlando Penner advice to patient.

## 2020-10-31 ENCOUNTER — Telehealth: Payer: Self-pay | Admitting: Student

## 2020-10-31 NOTE — Telephone Encounter (Addendum)
     Patient's husband called the after hours line reporting she has been having hypotension over the past few days (history of this with known POTS and orthostatic hypotension). She had surgery on 12/30 and she has felt weak since so difficult for him to say if symptoms have acutely worsened. BP was at 96/68 on 1/8, at 90/89 yesterday and at 79/53 this afternoon. I asked him to recheck it while on the phone and BP was improved to 100/73. We discussed increasing her salt and fluid intake. May require Midodrine again in the future pending her BP trend (intolerant to Florinef in the past secondary to variable K+ levels). I encouraged them to keep a BP log and report back if readings remain soft. I also recommended they reach out to her surgeon if her overall weakness does not improve as she may require a more prompt evaluation. Will route to Dr. [place] as an Lorain Childes.   Signed, Ellsworth Lennox, PA-C 10/31/2020, 2:11 PM Pager: [phone]

## 2020-11-01 ENCOUNTER — Emergency Department (HOSPITAL_COMMUNITY): Payer: BC Managed Care – PPO

## 2020-11-01 ENCOUNTER — Encounter (HOSPITAL_COMMUNITY): Payer: Self-pay | Admitting: Emergency Medicine

## 2020-11-01 ENCOUNTER — Other Ambulatory Visit: Payer: Self-pay

## 2020-11-01 ENCOUNTER — Inpatient Hospital Stay (HOSPITAL_COMMUNITY)
Admission: EM | Admit: 2020-11-01 | Discharge: 2020-11-03 | DRG: 314 | Disposition: A | Discharge status: Home / Self Care | Payer: BC Managed Care – PPO | Attending: Internal Medicine | Admitting: Internal Medicine

## 2020-11-01 ENCOUNTER — Telehealth: Payer: Self-pay

## 2020-11-01 DIAGNOSIS — Z7952 Long term (current) use of systemic steroids: Secondary | ICD-10-CM

## 2020-11-01 DIAGNOSIS — Z833 Family history of diabetes mellitus: Secondary | ICD-10-CM

## 2020-11-01 DIAGNOSIS — I10 Essential (primary) hypertension: Secondary | ICD-10-CM | POA: Diagnosis present

## 2020-11-01 DIAGNOSIS — Z89422 Acquired absence of other left toe(s): Secondary | ICD-10-CM

## 2020-11-01 DIAGNOSIS — E2740 Unspecified adrenocortical insufficiency: Secondary | ICD-10-CM | POA: Diagnosis present

## 2020-11-01 DIAGNOSIS — Z79899 Other long term (current) drug therapy: Secondary | ICD-10-CM

## 2020-11-01 DIAGNOSIS — E860 Dehydration: Secondary | ICD-10-CM

## 2020-11-01 DIAGNOSIS — E876 Hypokalemia: Secondary | ICD-10-CM | POA: Diagnosis present

## 2020-11-01 DIAGNOSIS — I959 Hypotension, unspecified: Principal | ICD-10-CM | POA: Diagnosis present

## 2020-11-01 DIAGNOSIS — Z792 Long term (current) use of antibiotics: Secondary | ICD-10-CM

## 2020-11-01 DIAGNOSIS — A0839 Other viral enteritis: Secondary | ICD-10-CM | POA: Diagnosis present

## 2020-11-01 DIAGNOSIS — I498 Other specified cardiac arrhythmias: Secondary | ICD-10-CM | POA: Diagnosis present

## 2020-11-01 DIAGNOSIS — G4733 Obstructive sleep apnea (adult) (pediatric): Secondary | ICD-10-CM | POA: Diagnosis present

## 2020-11-01 DIAGNOSIS — R197 Diarrhea, unspecified: Secondary | ICD-10-CM

## 2020-11-01 DIAGNOSIS — N179 Acute kidney failure, unspecified: Secondary | ICD-10-CM | POA: Diagnosis present

## 2020-11-01 DIAGNOSIS — E669 Obesity, unspecified: Secondary | ICD-10-CM | POA: Diagnosis present

## 2020-11-01 DIAGNOSIS — K219 Gastro-esophageal reflux disease without esophagitis: Secondary | ICD-10-CM | POA: Diagnosis present

## 2020-11-01 DIAGNOSIS — Z7984 Long term (current) use of oral hypoglycemic drugs: Secondary | ICD-10-CM

## 2020-11-01 DIAGNOSIS — Z880 Allergy status to penicillin: Secondary | ICD-10-CM | POA: Diagnosis not present

## 2020-11-01 DIAGNOSIS — M313 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: Secondary | ICD-10-CM | POA: Diagnosis present

## 2020-11-01 DIAGNOSIS — Z886 Allergy status to analgesic agent status: Secondary | ICD-10-CM | POA: Diagnosis not present

## 2020-11-01 DIAGNOSIS — Z6832 Body mass index (BMI) 32.0-32.9, adult: Secondary | ICD-10-CM

## 2020-11-01 DIAGNOSIS — M869 Osteomyelitis, unspecified: Secondary | ICD-10-CM | POA: Diagnosis present

## 2020-11-01 DIAGNOSIS — I9589 Other hypotension: Secondary | ICD-10-CM | POA: Diagnosis not present

## 2020-11-01 DIAGNOSIS — U071 COVID-19: Secondary | ICD-10-CM

## 2020-11-01 DIAGNOSIS — Z89421 Acquired absence of other right toe(s): Secondary | ICD-10-CM

## 2020-11-01 DIAGNOSIS — E1142 Type 2 diabetes mellitus with diabetic polyneuropathy: Secondary | ICD-10-CM | POA: Diagnosis present

## 2020-11-01 DIAGNOSIS — E782 Mixed hyperlipidemia: Secondary | ICD-10-CM | POA: Diagnosis present

## 2020-11-01 DIAGNOSIS — E1169 Type 2 diabetes mellitus with other specified complication: Secondary | ICD-10-CM | POA: Diagnosis present

## 2020-11-01 DIAGNOSIS — E039 Hypothyroidism, unspecified: Secondary | ICD-10-CM | POA: Diagnosis present

## 2020-11-01 DIAGNOSIS — G8929 Other chronic pain: Secondary | ICD-10-CM | POA: Diagnosis present

## 2020-11-01 DIAGNOSIS — Z8249 Family history of ischemic heart disease and other diseases of the circulatory system: Secondary | ICD-10-CM

## 2020-11-01 LAB — CBC WITH DIFFERENTIAL/PLATELET
Abs Immature Granulocytes: 0.26 10*3/uL — ABNORMAL HIGH (ref 0.00–0.07)
Basophils Absolute: 0.1 10*3/uL (ref 0.0–0.1)
Basophils Relative: 1 %
Eosinophils Absolute: 0 10*3/uL (ref 0.0–0.5)
Eosinophils Relative: 1 %
HCT: 42 % (ref 36.0–46.0)
Hemoglobin: 12.2 g/dL (ref 12.0–15.0)
Immature Granulocytes: 3 %
Lymphocytes Relative: 22 %
Lymphs Abs: 1.8 10*3/uL (ref 0.7–4.0)
MCH: 25.4 pg — ABNORMAL LOW (ref 26.0–34.0)
MCHC: 29 g/dL — ABNORMAL LOW (ref 30.0–36.0)
MCV: 87.3 fL (ref 80.0–100.0)
Monocytes Absolute: 0.5 10*3/uL (ref 0.1–1.0)
Monocytes Relative: 6 %
Neutro Abs: 5.6 10*3/uL (ref 1.7–7.7)
Neutrophils Relative %: 67 %
Platelets: 368 10*3/uL (ref 150–400)
RBC: 4.81 MIL/uL (ref 3.87–5.11)
RDW: 19.2 % — ABNORMAL HIGH (ref 11.5–15.5)
WBC: 8.2 10*3/uL (ref 4.0–10.5)
nRBC: 0 % (ref 0.0–0.2)

## 2020-11-01 LAB — COMPREHENSIVE METABOLIC PANEL
ALT: 34 U/L (ref 0–44)
AST: 61 U/L — ABNORMAL HIGH (ref 15–41)
Albumin: 2.5 g/dL — ABNORMAL LOW (ref 3.5–5.0)
Alkaline Phosphatase: 102 U/L (ref 38–126)
Anion gap: 10 (ref 5–15)
BUN: 22 mg/dL — ABNORMAL HIGH (ref 6–20)
CO2: 22 mmol/L (ref 22–32)
Calcium: 8.7 mg/dL — ABNORMAL LOW (ref 8.9–10.3)
Chloride: 106 mmol/L (ref 98–111)
Creatinine, Ser: 1.28 mg/dL — ABNORMAL HIGH (ref 0.44–1.00)
GFR, Estimated: 50 mL/min — ABNORMAL LOW (ref >60)
Glucose, Bld: 110 mg/dL — ABNORMAL HIGH (ref 70–99)
Potassium: 3.3 mmol/L — ABNORMAL LOW (ref 3.5–5.1)
Sodium: 138 mmol/L (ref 135–145)
Total Bilirubin: 0.6 mg/dL (ref 0.3–1.2)
Total Protein: 5.8 g/dL — ABNORMAL LOW (ref 6.5–8.1)

## 2020-11-01 LAB — URINALYSIS, ROUTINE W REFLEX MICROSCOPIC
Bilirubin Urine: NEGATIVE
Glucose, UA: NEGATIVE mg/dL
Hgb urine dipstick: NEGATIVE
Ketones, ur: NEGATIVE mg/dL
Nitrite: NEGATIVE
Protein, ur: NEGATIVE mg/dL
Specific Gravity, Urine: 1.006 (ref 1.005–1.030)
pH: 6 (ref 5.0–8.0)

## 2020-11-01 LAB — LACTIC ACID, PLASMA
Lactic Acid, Venous: 2.2 mmol/L (ref 0.5–1.9)
Lactic Acid, Venous: 1.7 mmol/L (ref 0.5–1.9)

## 2020-11-01 LAB — PROTIME-INR
INR: 1 (ref 0.8–1.2)
Prothrombin Time: 12.7 seconds (ref 11.4–15.2)

## 2020-11-01 LAB — MAGNESIUM: Magnesium: 1.9 mg/dL (ref 1.7–2.4)

## 2020-11-01 LAB — C-REACTIVE PROTEIN: CRP: 4.8 mg/dL — ABNORMAL HIGH (ref <1.0)

## 2020-11-01 LAB — CBG MONITORING, ED: Glucose-Capillary: 87 mg/dL (ref 70–99)

## 2020-11-01 LAB — RESP PANEL BY RT-PCR (FLU A&B, COVID) ARPGX2
Influenza A by PCR: NEGATIVE
Influenza B by PCR: NEGATIVE
SARS Coronavirus 2 by RT PCR: POSITIVE — AB

## 2020-11-01 LAB — PROCALCITONIN: Procalcitonin: <0.1 ng/mL

## 2020-11-01 LAB — FERRITIN: Ferritin: 141 ng/mL (ref 11–307)

## 2020-11-01 LAB — APTT: aPTT: 30 seconds (ref 24–36)

## 2020-11-01 MED ORDER — VANCOMYCIN HCL 2000 MG/400ML IV SOLN
2000.0000 mg | Freq: Once | INTRAVENOUS | Status: AC
  Administered 2020-11-01: 2000 mg via INTRAVENOUS
  Filled 2020-11-01: qty 400

## 2020-11-01 MED ORDER — LACTATED RINGERS IV SOLN: INTRAVENOUS | Status: AC

## 2020-11-01 MED ORDER — ENOXAPARIN SODIUM 40 MG/0.4ML ~~LOC~~ SOLN
40.0000 mg | SUBCUTANEOUS | Status: DC
  Administered 2020-11-01 – 2020-11-02 (×2): 40 mg via SUBCUTANEOUS
  Filled 2020-11-01 (×2): qty 0.4

## 2020-11-01 MED ORDER — VANCOMYCIN HCL IN DEXTROSE 1-5 GM/200ML-% IV SOLN
1000.0000 mg | Freq: Once | INTRAVENOUS | Status: DC
  Filled 2020-11-01: qty 200

## 2020-11-01 MED ORDER — HYDROCORTISONE NA SUCCINATE PF 100 MG IJ SOLR
50.0000 mg | Freq: Three times a day (TID) | INTRAMUSCULAR | Status: DC
  Administered 2020-11-01 – 2020-11-02 (×3): 50 mg via INTRAVENOUS
  Filled 2020-11-01 (×3): qty 2

## 2020-11-01 MED ORDER — INSULIN ASPART 100 UNIT/ML ~~LOC~~ SOLN: 0.0000 [IU] | Freq: Every day | SUBCUTANEOUS | Status: DC

## 2020-11-01 MED ORDER — HYDROCOD POLST-CPM POLST ER 10-8 MG/5ML PO SUER: 5.0000 mL | Freq: Two times a day (BID) | ORAL | Status: DC | PRN

## 2020-11-01 MED ORDER — ALBUTEROL SULFATE (2.5 MG/3ML) 0.083% IN NEBU: 2.5000 mg | INHALATION_SOLUTION | Freq: Four times a day (QID) | RESPIRATORY_TRACT | Status: DC | PRN

## 2020-11-01 MED ORDER — MONTELUKAST SODIUM 10 MG PO TABS
10.0000 mg | ORAL_TABLET | Freq: Every day | ORAL | Status: DC
  Administered 2020-11-01 – 2020-11-03 (×3): 10 mg via ORAL
  Filled 2020-11-01 (×3): qty 1

## 2020-11-01 MED ORDER — VANCOMYCIN HCL IN DEXTROSE 1-5 GM/200ML-% IV SOLN: 1000.0000 mg | Freq: Two times a day (BID) | INTRAVENOUS | Status: DC

## 2020-11-01 MED ORDER — ACETAMINOPHEN 325 MG PO TABS
650.0000 mg | ORAL_TABLET | Freq: Four times a day (QID) | ORAL | Status: DC | PRN
  Administered 2020-11-02: 650 mg via ORAL
  Filled 2020-11-01: qty 2

## 2020-11-01 MED ORDER — DULOXETINE HCL 60 MG PO CPEP
60.0000 mg | ORAL_CAPSULE | Freq: Two times a day (BID) | ORAL | Status: DC
  Administered 2020-11-01 – 2020-11-03 (×4): 60 mg via ORAL
  Filled 2020-11-01 (×2): qty 2
  Filled 2020-11-01 (×2): qty 1

## 2020-11-01 MED ORDER — ALBUTEROL SULFATE HFA 108 (90 BASE) MCG/ACT IN AERS: 2.0000 | INHALATION_SPRAY | Freq: Four times a day (QID) | RESPIRATORY_TRACT | Status: DC | PRN

## 2020-11-01 MED ORDER — ACETAMINOPHEN 650 MG RE SUPP: 650.0000 mg | Freq: Four times a day (QID) | RECTAL | Status: DC | PRN

## 2020-11-01 MED ORDER — GABAPENTIN 300 MG PO CAPS
900.0000 mg | ORAL_CAPSULE | Freq: Every day | ORAL | Status: DC
  Administered 2020-11-02 – 2020-11-03 (×2): 900 mg via ORAL
  Filled 2020-11-01 (×2): qty 3

## 2020-11-01 MED ORDER — SODIUM CHLORIDE 0.9 % IV BOLUS
3000.0000 mL | Freq: Once | INTRAVENOUS | Status: AC
  Administered 2020-11-01: 3000 mL via INTRAVENOUS

## 2020-11-01 MED ORDER — SODIUM CHLORIDE 0.9 % IV SOLN
2.0000 g | Freq: Once | INTRAVENOUS | Status: DC
  Filled 2020-11-01 (×2): qty 2

## 2020-11-01 MED ORDER — TOPIRAMATE 100 MG PO TABS
100.0000 mg | ORAL_TABLET | Freq: Every day | ORAL | Status: DC
  Administered 2020-11-01 – 2020-11-03 (×3): 100 mg via ORAL
  Filled 2020-11-01: qty 4
  Filled 2020-11-01: qty 1
  Filled 2020-11-01: qty 4

## 2020-11-01 MED ORDER — ONDANSETRON HCL 4 MG PO TABS: 4.0000 mg | ORAL_TABLET | Freq: Four times a day (QID) | ORAL | Status: DC | PRN

## 2020-11-01 MED ORDER — ONDANSETRON HCL 4 MG/2ML IJ SOLN: 4.0000 mg | Freq: Four times a day (QID) | INTRAMUSCULAR | Status: DC | PRN

## 2020-11-01 MED ORDER — GABAPENTIN 300 MG PO CAPS
600.0000 mg | ORAL_CAPSULE | Freq: Every day | ORAL | Status: DC
  Administered 2020-11-02: 600 mg via ORAL
  Filled 2020-11-01: qty 2

## 2020-11-01 MED ORDER — TRAZODONE HCL 50 MG PO TABS
100.0000 mg | ORAL_TABLET | Freq: Every day | ORAL | Status: DC
  Administered 2020-11-01 – 2020-11-02 (×2): 100 mg via ORAL
  Filled 2020-11-01 (×2): qty 2

## 2020-11-01 MED ORDER — LACTATED RINGERS IV SOLN: INTRAVENOUS | Status: DC

## 2020-11-01 MED ORDER — TAPENTADOL HCL 50 MG PO TABS
50.0000 mg | ORAL_TABLET | Freq: Two times a day (BID) | ORAL | Status: DC
Start: 2020-11-01 — End: 2020-11-02
  Filled 2020-11-01: qty 1

## 2020-11-01 MED ORDER — SODIUM CHLORIDE 0.9 % IV SOLN
2.0000 g | Freq: Three times a day (TID) | INTRAVENOUS | Status: DC
  Administered 2020-11-01: 2 g via INTRAVENOUS
  Filled 2020-11-01: qty 2

## 2020-11-01 MED ORDER — GABAPENTIN 400 MG PO CAPS
1200.0000 mg | ORAL_CAPSULE | Freq: Every day | ORAL | Status: DC
  Administered 2020-11-01 – 2020-11-02 (×2): 1200 mg via ORAL
  Filled 2020-11-01 (×2): qty 3

## 2020-11-01 MED ORDER — INSULIN ASPART 100 UNIT/ML ~~LOC~~ SOLN
0.0000 [IU] | Freq: Three times a day (TID) | SUBCUTANEOUS | Status: DC
  Administered 2020-11-02: 2 [IU] via SUBCUTANEOUS
  Filled 2020-11-01: qty 1

## 2020-11-01 MED ORDER — POTASSIUM CHLORIDE CRYS ER 20 MEQ PO TBCR
40.0000 meq | EXTENDED_RELEASE_TABLET | Freq: Once | ORAL | Status: AC
  Administered 2020-11-01: 40 meq via ORAL
  Filled 2020-11-01: qty 2

## 2020-11-01 NOTE — Telephone Encounter (Signed)
Patient's husband called back and states that EMS came to the house and ruled out a stroke. He states that EMS advised him not to take the patient to the ED due to risk of COVID exposure and long wait times. Husband is asking if home health nurse can come out to draw labs. Will route to provider for recommendation.   Sandie Ano, RN

## 2020-11-01 NOTE — H&P (Signed)
History and Physical    [name] UKG:[id number] DOB: October 15, 1966 DOA: 11/01/2020  PCP: Elizabeth Palau, [name]  Patient coming from: Home  I have personally briefly reviewed patient's old medical records in [facility]  Chief Complaint: Diarrhea  HPI: [name] is a 55 y.o. 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  She has been taking ciprofloxacin and linezolid.  Patient reports over the past week she has been having frequent loose stools, up to 5 a day.  She had nausea, but only began throwing up yesterday.  She also reports having fever.  She has had cough which is nonproductive.  She also feels she sharply more short of breath on exertion.  She has been dizzy on standing.  She has some epigastric abdominal discomfort.  This is worse when she tries to eat anything.  She does experience a lot of heartburn.  ED Course: On arrival to the emergency room, she is noted to be hypotensive with systolic blood pressure in the 60s.  She is bolused 3 L IV fluids per sepsis protocol with improvement of blood pressure.  She received intravenous antibiotics.  COVID-19 test found be positive.  Chest x-ray did not show any evidence of pneumonia.  Mild elevation of creatinine.  She was referred for admission  Review of Systems:Review of Systems  Constitutional: Positive for chills and fever.  HENT: Positive for congestion. Negative for sore throat.   Eyes: Negative for blurred vision and double vision.  Respiratory: Positive for cough and shortness of breath.   Cardiovascular: Negative for chest pain and leg swelling.  Gastrointestinal: Positive for abdominal pain, diarrhea, heartburn, nausea and vomiting.  Genitourinary: Negative for dysuria.  Neurological: Positive for dizziness and weakness. Negative for focal weakness.        Past Medical History:  Diagnosis Date  . Chronic cough   . Chronic pain   . Diabetes mellitus without complication (HCC)   . Dyspnea   . GERD (gastroesophageal reflux disease)   . Hypokalemia   . Hypothyroidism   . Neurocardiogenic syncope   . OSA (obstructive sleep apnea)    does not use CPAP  . Osteomyelitis (HCC)    bilateral feet  . Peripheral neuropathy   . Presence of intrathecal pump    recieves Prialt/bupivicaine  . Wegener's granulomatosis 2009  . Wegner's disease (congenital syphilitic osteochondritis)     Past Surgical History:  Procedure Laterality Date  . AMPUTATION Right 10/21/2020   Procedure: Right hallux amputation;  Surgeon: Toni Arthurs, MD;  Location: [facility];  Service: Orthopedics;  Laterality: Right;  . AMPUTATION TOE Bilateral 08/12/2020   Procedure: Right 3rd toe amputation; left hallux and 3rd toe amputation;  Surgeon: Toni Arthurs, MD;  Location: [facility];  Service: Orthopedics;  Laterality: Bilateral;   . APPENDECTOMY    . BACK SURGERY    . CHOLECYSTECTOMY    . COLONOSCOPY N/A 06/02/2013   Procedure: COLONOSCOPY;  Surgeon: Graylin Shiver, MD;  Location: [facility];  Service: Endoscopy;  Laterality: N/A;  . HERNIA REPAIR  2000  . INCISION AND DRAINAGE ABSCESS Left 07/07/2016   Procedure: DEBRIDMENT LEFT THIGH ABSCESS, EXISION ACUTE SKIN RASH LEFT THIGH(1CM LESION);  Surgeon: Claud Kelp, MD;  Location: [facility];  Service: General;  Laterality: Left;  . SEPTOPLASTY  02/2013  . spleen anuyism    . splenic aneurysm    .  TRANSMETATARSAL AMPUTATION Bilateral 10/21/2020   Procedure: Left transmetatarsal amputation;  Surgeon: Toni ArthursHewitt, John, MD;  Location: [facility];  Service: Orthopedics;  Laterality: Bilateral;  . VAGINAL HYSTERECTOMY      Social History:  reports that she has never smoked. She has never used smokeless tobacco. She reports current alcohol use. She reports that she does  not use drugs.  Allergies  Allergen Reactions  . Ibuprofen Other (See Comments)    Pt is unable to take this due to kidney problems.   Other reaction(s): Other Not to take with other medications   . Penicillins Rash and Other (See Comments)    Has patient had a PCN reaction causing immediate rash, facial/tongue/throat swelling, SOB or lightheadedness with hypotension: No Has patient had a PCN reaction causing severe rash involving mucus membranes or skin necrosis: No Has patient had a PCN reaction that required hospitalization No Has patient had a PCN reaction occurring within the last 10 years: No If all of the above answers are "NO", then may proceed with Cephalosporin use.    Family History  Problem Relation Age of Onset  . Diabetes Mother   . Heart disease Mother   . Diabetes Father     Prior to Admission medications   Medication Sig Start Date End Date Taking? Authorizing Provider  acetaminophen (TYLENOL) 325 MG tablet Take 2 tablets (650 mg total) by mouth every 6 (six) hours as needed for mild pain (or Fever >/= 101). 07/12/16  Yes Vann, Jessica U, DO  albuterol (PROVENTIL HFA;VENTOLIN HFA) 108 (90 Base) MCG/ACT inhaler Inhale 2 puffs into the lungs every 6 (six) hours as needed for wheezing or shortness of breath.   Yes [provider]  b complex vitamins tablet Take 1 tablet by mouth daily.   Yes [provider]  Calcium Carb-Cholecalciferol (CALCIUM CARBONATE-VITAMIN D3 PO) Take 1 tablet by mouth daily.   Yes [provider]  ciprofloxacin (CIPRO) 500 MG tablet Take 1 tablet (500 mg total) by mouth in the morning and at bedtime for 21 days. 10/14/20 11/04/20 Yes Vu, Tonita Phoenixrung T, MD  DULoxetine (CYMBALTA) 60 MG capsule Take 60 mg by mouth 2 (two) times daily.   Yes [provider]  famotidine (PEPCID) 20 MG tablet Take 20 mg daily 01/12/20  Yes [place], Peter M, MD  ferrous sulfate 325 (65 FE) MG tablet Take 325 mg by mouth daily with breakfast.    Yes [provider]  fluticasone (FLONASE) 50 MCG/ACT nasal spray Place 2 sprays into both nostrils daily as needed for rhinitis.   Yes [provider]  folic acid (FOLVITE) 1 MG tablet Take 2 mg by mouth daily.   Yes [provider]  furosemide (LASIX) 40 MG tablet Take 1 tablet (40 mg total) by mouth as directed. 09/06/20  Yes [place], Peter M, MD  gabapentin (NEURONTIN) 300 MG capsule Take 600-900 mg by mouth See admin instructions. Take 3 capsules (900 mg total) by mouth in the morning, 2 capsules (600 mg total) in the early afternoon, and 4 capsules (1200 mg total) in the evening.   Yes [provider]  lidocaine (LIDODERM) 5 % Place 1 patch onto the skin daily as needed (for pain). Remove & Discard patch within 12 hours or as directed by MD   Yes [provider]  magnesium oxide (MAG-OX) 400 MG tablet Take 400 mg by mouth 2 (two) times daily.   Yes [provider]  metFORMIN (GLUCOPHAGE) 500 MG tablet Take  500 mg by mouth 2 (two) times daily with a meal.   Yes [provider]  methotrexate 50 MG/2ML injection Inject 1 mL into the skin once a week.    Yes [provider]  montelukast (SINGULAIR) 10 MG tablet Take 10 mg by mouth daily. 12/30/17  Yes [provider]  Potassium Chloride Crys ER (K-DUR PO) Take 40 mEq by mouth 3 (three) times daily.   Yes [provider]  tapentadol (NUCYNTA) 50 MG tablet Take 50 mg by mouth 2 (two) times daily.   Yes [provider]  tizanidine (ZANAFLEX) 2 MG capsule Take 2 mg by mouth 3 (three) times daily as needed for muscle spasms. 05/10/17  Yes [provider]  topiramate (TOPAMAX) 100 MG tablet Take 100 mg by mouth daily. 01/14/18  Yes [provider]  traZODone (DESYREL) 100 MG tablet Take 1 tablet (100 mg total) by mouth at bedtime. 01/12/20  Yes [place], Peter M, MD  vitamin B-12 (CYANOCOBALAMIN) 1000 MCG tablet Take 1,000 mcg by mouth daily.    Yes [provider]  Vitamin D, Ergocalciferol, (DRISDOL) 50000 units CAPS capsule Take 50,000 Units by mouth every 7 (seven) days. Pt takes on Friday.   Yes [provider]  ZICONOTIDE ACETATE IT by Intrathecal route every 3 (three) months. Unknown dose   Yes [provider]  linezolid (ZYVOX) 600 MG tablet Take 1 tablet (600 mg total) by mouth 2 (two) times daily for 7 days. Patient not taking: No sig reported 10/26/20 11/02/20  Rutha Bouchard T, MD    Physical Exam: Vitals:   11/01/20 1830 11/01/20 1835 11/01/20 1900 11/01/20 1930  BP: 103/77  96/71 109/71  Pulse: (!) 51  61 61  Resp: (!) 23  18 16   Temp:      TempSrc:      SpO2:  100% 100% 98%  Weight:      Height:        Constitutional: NAD, calm, comfortable Eyes: PERRL, lids and conjunctivae normal ENMT: Mucous membranes are moist. Posterior pharynx clear of any exudate or lesions.Normal dentition.  Neck: normal, supple, no masses, no thyromegaly Respiratory: clear to auscultation bilaterally, no wheezing, no crackles. Normal respiratory effort. No accessory muscle use.  Cardiovascular: Regular rate and rhythm, no murmurs / rubs / gallops. No extremity edema. 2+ pedal pulses. No carotid bruits.  Abdomen: no tenderness, no masses palpated. No hepatosplenomegaly. Bowel sounds positive.  Musculoskeletal: Left transmetatarsal amputation, right great toe amputation, all incision sites appear to be clean without signs of infection Skin: no rashes, lesions, ulcers. No induration Neurologic: CN 2-12 grossly intact. Sensation intact, DTR normal. Strength 5/5 in all 4.  Psychiatric: Normal judgment and insight. Alert and oriented x 3. Normal mood.    Labs on Admission: I have personally reviewed following labs and imaging studies  CBC: Recent Labs  Lab 11/01/20 1152  WBC 8.2  NEUTROABS 5.6  HGB 12.2  HCT 42.0  MCV 87.3  PLT 368   Basic Metabolic Panel: Recent Labs  Lab 11/01/20 1152  NA 138  K 3.3*   CL 106  CO2 22  GLUCOSE 110*  BUN 22*  CREATININE 1.28*  CALCIUM 8.7*   GFR: Estimated Creatinine Clearance: 57.2 mL/min (A) (by C-G formula based on SCr of 1.28 mg/dL (H)). Liver Function Tests: Recent Labs  Lab 11/01/20 1152  AST 61*  ALT 34  ALKPHOS 102  BILITOT 0.6  PROT 5.8*  ALBUMIN 2.5*   No results for input(s):  LIPASE, AMYLASE in the last 168 hours. No results for input(s): AMMONIA in the last 168 hours. Coagulation Profile: Recent Labs  Lab 11/01/20 1152  INR 1.0   Cardiac Enzymes: No results for input(s): CKTOTAL, CKMB, CKMBINDEX, TROPONINI in the last 168 hours. BNP (last 3 results) No results for input(s): PROBNP in the last 8760 hours. HbA1C: No results for input(s): HGBA1C in the last 72 hours. CBG: No results for input(s): GLUCAP in the last 168 hours. Lipid Profile: No results for input(s): CHOL, HDL, LDLCALC, TRIG, CHOLHDL, LDLDIRECT in the last 72 hours. Thyroid Function Tests: No results for input(s): TSH, T4TOTAL, FREET4, T3FREE, THYROIDAB in the last 72 hours. Anemia Panel: No results for input(s): VITAMINB12, FOLATE, FERRITIN, TIBC, IRON, RETICCTPCT in the last 72 hours. Urine analysis:    Component Value Date/Time   COLORURINE STRAW (A) 11/01/2020 1325   APPEARANCEUR CLEAR 11/01/2020 1325   LABSPEC 1.006 11/01/2020 1325   PHURINE 6.0 11/01/2020 1325   GLUCOSEU NEGATIVE 11/01/2020 1325   HGBUR NEGATIVE 11/01/2020 1325   BILIRUBINUR NEGATIVE 11/01/2020 1325   KETONESUR NEGATIVE 11/01/2020 1325   PROTEINUR NEGATIVE 11/01/2020 1325   UROBILINOGEN 0.2 05/30/2013 1112   NITRITE NEGATIVE 11/01/2020 1325   LEUKOCYTESUR TRACE (A) 11/01/2020 1325    Radiological Exams on Admission: CT Head Wo Contrast  Result Date: 11/01/2020 CLINICAL DATA:  Headache over the last week EXAM: CT HEAD WITHOUT CONTRAST TECHNIQUE: Contiguous axial images were obtained from the base of the skull through the vertex without intravenous contrast. COMPARISON:   06/01/2013 FINDINGS: Brain: The brainstem and cerebellum are normal. Are there is an approximately 2 cm region of newly seen low-attenuation in the right frontal white matter. This was not visible in 2014. Question other subtle areas of white matter low-density in both hemispheres. No evidence of mass lesion or mass effect, hemorrhage, hydrocephalus or extra-axial collection. Vascular: No abnormal vascular finding. Skull: Normal Sinuses/Orbits: Clear/normal Other: None IMPRESSION: 2 cm region of newly seen low-attenuation in the right frontal white matter. This was not visible in 2014. Question other subtle areas of white matter low-density in both hemispheres. These findings are nonspecific, but could relate to chronic small-vessel ischemic change. The possibility of demyelinating disease is also possible. White matter mass lesion not completely excluded. MRI of the brain with and without contrast would be suggested for further evaluation. Electronically Signed   By: Paulina FusiMark  Shogry M.D.   On: 11/01/2020 13:03   DG Chest Port 1 View  Result Date: 11/01/2020 CLINICAL DATA:  Cough with confusion EXAM: PORTABLE CHEST 1 VIEW COMPARISON:  January 12, 2017 FINDINGS: Lungs are clear. The heart size and pulmonary vascularity are normal. No adenopathy. No bone lesions. IMPRESSION: Lungs clear.  Cardiac silhouette normal. Electronically Signed   By: Bretta BangWilliam  Woodruff III M.D.   On: 11/01/2020 13:49    EKG: Independently reviewed.  Poor quality tracing with a lot of artifact, appears to be sinus rhythm without acute changes  Assessment/Plan Active Problems:   Adrenal insufficiency (HCC)   OSA (obstructive sleep apnea)   Hypotension   Obesity (BMI 30-39.9)   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)   COVID-19 virus infection   Diarrhea   Dehydration     Hypotension -Sepsis ruled out -Suspect that her low blood pressure is related to volume depletion from dehydration -She is responding to IV  fluids -She does not have any leukocytosis or fever at this time -Continue IV hydration -She may have also have a component of adrenal insufficiency  and will be started on stress dose steroids  COVID-19 infection -Patient did test positive for COVID-19 -Chest x-ray does not show any evidence of pneumonia -She is on minimal oxygen -Check inflammatory markers -It appears that her symptoms have been present for more than 7 days, would not prescribe remdesivir  Diarrhea -Possibly related to COVID-19 -Since she has been on chronic antibiotics, will need to check C. Difficile -We will also check GI pathogen panel  Wegener's granulomatosis -On chronic prednisone therapy at 10 mg/day -She has been started on stress dose steroids for now  Acute kidney injury -Suspect is related to dehydration -She she has good urine output -Recheck labs in a.m.  Hypokalemia -Replace -Check magnesium  DVT prophylaxis: lovenox Code Status: full code  Family Communication: dicussed with patient  Disposition Plan: discharge home once hemodynamics have stabilized  Consults called:   Admission status: inpatient, telemetry   Erick Blinks MD [facility]   If 7PM-7AM, please contact night-coverage [email or web address]   11/01/2020, 8:02 PM

## 2020-11-01 NOTE — Telephone Encounter (Signed)
Received call from patient's husband stating that the patient is experiencing severe diarrhea and dehydration. Patient took imodium last night, but husband is unsure if it has helped. He says her blood pressures have been running 100/70 and 90/50 at home. Husband states she feels very weak and lethargic with difficulty raising left arm. He says patient is hallucinating and having difficulty verbalizing. RN advised patient's husband that these symptoms are very concerning and that he should call EMS to transport her to the hospital. RN emphasized possibility of stroke or other neurological event. Patient's husband states he will call EMS to transport the patient   Sandie Ano, RN

## 2020-11-01 NOTE — ED Triage Notes (Signed)
Pt to the ED with c/o dehydration, extremity pain and confusion.

## 2020-11-01 NOTE — Progress Notes (Signed)
Pharmacy Antibiotic Note  [name] is a 55 y.o. female admitted on 11/01/2020 with unknown source.  Pharmacy has been consulted for Vancomycin and cefepime dosing.  Plan: Vancomycin 2000mg  loading dose, then 1000mg  IV every 12 hours.  Goal trough 15-20 mcg/mL.  Cefepime 2gm IV q12h F/U cxs and clinical progress Monitor V/S, labs and levels as indicated  Height: 5\' 6"  (167.6 cm) Weight: 91.3 kg (201 lb 4.5 oz) IBW/kg (Calculated) : 59.3  Temp (24hrs), Avg:98.2 F (36.8 C), Min:98.2 F (36.8 C), Max:98.2 F (36.8 C)  Recent Labs  Lab 11/01/20 1152  WBC 8.2    Estimated Creatinine Clearance: 60.5 mL/min (A) (by C-G formula based on SCr of 1.21 mg/dL (H)).    Allergies  Allergen Reactions  . Ibuprofen Other (See Comments)    Pt is unable to take this due to kidney problems.   Other reaction(s): Other Not to take with other medications   . Penicillins Rash and Other (See Comments)    Has patient had a PCN reaction causing immediate rash, facial/tongue/throat swelling, SOB or lightheadedness with hypotension: No Has patient had a PCN reaction causing severe rash involving mucus membranes or skin necrosis: No Has patient had a PCN reaction that required hospitalization No Has patient had a PCN reaction occurring within the last 10 years: No If all of the above answers are "NO", then may proceed with Cephalosporin use.    Antimicrobials this admission: Vancomycin 1/10>>  Cefepime 1/10 >>   Dose adjustments this admission: prn  Microbiology results: 1/10 1/10 UCx: pending  MRSA PCR:   Thank you for allowing pharmacy to be a part of this patient's care.  12/30/20, BS 3/10, [id number] Clinical Pharmacist Pager [phone] 11/01/2020 12:39 PM

## 2020-11-01 NOTE — ED Triage Notes (Signed)
Pt brought to ED via [facility] for generalized weakness. Pt is also worried about dehydration per EMS. Pt took her chronic pain medication PTA to ED.

## 2020-11-01 NOTE — ED Notes (Signed)
Pt sats are 92% on RA and 97% on 2L. MD aware.

## 2020-11-01 NOTE — Progress Notes (Signed)
Following for code sepsis 

## 2020-11-01 NOTE — ED Provider Notes (Signed)
[facility] EMERGENCY DEPARTMENT Provider Note   CSN: [id number] Arrival date & time: 11/01/20  1039     History Chief Complaint  Patient presents with  . Hypotension    [name] is a 55 y.o. female.  Patient has diabetes along with osteomyelitis with recent amputation of toes.  Patient also has a history of pots disease and her husband states that she has been having diarrhea for a number days.  She was on antibiotics for the osteomyelitis.  He states that she has been more confused than normal and her blood pressures been running really low  The history is provided by the patient and medical records. No language interpreter was used.  Weakness Severity:  Moderate Onset quality:  Sudden Timing:  Constant Progression:  Worsening Chronicity:  Recurrent Context: not alcohol use   Relieved by:  Nothing Worsened by:  Nothing Ineffective treatments:  None tried Associated symptoms: no abdominal pain, no chest pain, no cough, no diarrhea, no frequency, no headaches and no seizures        Past Medical History:  Diagnosis Date  . Chronic cough   . Chronic pain   . Diabetes mellitus without complication (HCC)   . Dyspnea   . GERD (gastroesophageal reflux disease)   . Hypokalemia   . Hypothyroidism   . Neurocardiogenic syncope   . OSA (obstructive sleep apnea)    does not use CPAP  . Osteomyelitis (HCC)    bilateral feet  . Peripheral neuropathy   . Presence of intrathecal pump    recieves Prialt/bupivicaine  . Wegener's granulomatosis 2009  . Wegner's disease (congenital syphilitic osteochondritis)     Patient Active Problem List   Diagnosis Date Noted  . Generalized abdominal pain 10/09/2018  . Midline low back pain 09/11/2018  . Malfunction of intrathecal infusion pump 09/11/2018  . 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 Steroid-induced hyperglycemia 05/11/2018  . Weakness 05/02/2018  . Morbid obesity (HCC) 01/25/2018  . Neuropathy 01/25/2018  .  Presence of intrathecal pump 08/02/2017  . Rectocele 03/09/2017  . Urinary hesitancy 03/09/2017  . Abnormal finding on imaging 12/19/2016  . Esophageal dysphagia 12/19/2016  . Current chronic use of systemic steroids 10/26/2016  . IFG (impaired fasting glucose) 10/26/2016  . Arthralgia of multiple joints 07/15/2016  . Headache 07/15/2016  . Maculopapular rash, generalized 07/15/2016  . Peripheral neuropathy 07/04/2016  . Rash 07/04/2016  . CKD (chronic kidney disease) stage 3, GFR 30-59 ml/min (HCC) 07/04/2016  . 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 Neuropathic pain of both feet 01/11/2016  . OSA (obstructive sleep apnea) 12/29/2015  . Chronic insomnia 12/29/2015  . Mixed hyperlipidemia 12/29/2015  . Obesity, Class II, BMI 35-39.9, with comorbidity 12/29/2015  . Polyneuropathy associated with underlying disease (HCC) 12/29/2015  . Wegener's granulomatosis (granulomatosis with polyangiitis) 12/29/2015  . Class 2 drug-induced obesity with serious comorbidity and body mass index (BMI) of 35.0 to 35.9 in adult 12/29/2015  . Other mechanical complication of implanted electronic neurostimulator of spinal cord electrode (lead), initial encounter (HCC) 08/04/2015  . Adjustment disorder with depressed mood 03/29/2015  . Hyperlipidemia 07/20/2014  . Mononeuritis 07/20/2014  . Obesity (BMI 30-39.9) 07/20/2014  . Essential hypertension 07/20/2014  . 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 Neurocardiogenic syncope 07/06/2014  . Chronic anemia 06/08/2014  . POTS (postural orthostatic tachycardia syndrome) 06/08/2014  . Tachycardia 06/08/2014  . Hypotension 06/03/2014  . Anal fissure 09/16/2013  . Adrenal insufficiency (HCC) 05/30/2013  . Encounter for long-term (current) use  of high-risk medication 05/30/2013  . Fall 05/30/2013  . Hemorrhage of rectum and anus 05/30/2013  . Hypokalemia 05/30/2013  . Leukocytosis 05/30/2013  . Other long term  (current) drug therapy 05/30/2013  . Syncope and collapse 05/30/2013  . 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/30/2013  . 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/2013  . Wegner's disease (congenital syphilitic osteochondritis)   . Gastroesophageal reflux disease without esophagitis 03/07/2012  . Other diseases of trachea and bronchus 08/30/2011  . 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 Atrophy of nasal turbinates 07/25/2011  . Nasal septal perforation 07/25/2011  . Cough 03/24/2011  . 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  . 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 Sleep apnea 03/24/2011    Past Surgical History:  Procedure Laterality Date  . AMPUTATION Right 10/21/2020   Procedure: Right hallux amputation;  Surgeon: Toni ArthursHewitt, John, MD;  Location: [facility];  Service: Orthopedics;  Laterality: Right;  . AMPUTATION TOE Bilateral 08/12/2020   Procedure: Right 3rd toe amputation; left hallux and 3rd toe amputation;  Surgeon: Toni ArthursHewitt, John, MD;  Location: [facility];  Service: Orthopedics;  Laterality: Bilateral;  45min  . APPENDECTOMY    . BACK SURGERY    . CHOLECYSTECTOMY    . COLONOSCOPY N/A 06/02/2013   Procedure: COLONOSCOPY;  Surgeon: Graylin ShiverSalem F Ganem, MD;  [facility];  Service: Endoscopy;  Laterality: N/A;  . HERNIA REPAIR  2000  . INCISION AND DRAINAGE ABSCESS Left 07/07/2016   Procedure: DEBRIDMENT LEFT THIGH ABSCESS, EXISION ACUTE SKIN RASH LEFT THIGH(1CM LESION);  Surgeon: Claud KelpHaywood Ingram, MD;  Location: [facility];  Service: General;  Laterality: Left;  . SEPTOPLASTY  02/2013  . spleen anuyism    . splenic aneurysm    . TRANSMETATARSAL AMPUTATION Bilateral 10/21/2020   Procedure: Left transmetatarsal amputation;  Surgeon: Toni ArthursHewitt, John, MD;  Location: [facility];  Service: Orthopedics;  Laterality: Bilateral;  . VAGINAL HYSTERECTOMY       OB History   No obstetric history on file.     Family History  Problem Relation  Age of Onset  . Diabetes Mother   . Heart disease Mother   . Diabetes Father     Social History   Tobacco Use  . Smoking status: Never Smoker  . Smokeless tobacco: Never Used  Vaping Use  . Vaping Use: Never used  Substance Use Topics  . Alcohol use: Yes    Comment: occasionally  . Drug use: No    Home Medications Prior to Admission medications   Medication Sig Start Date End Date Taking? Authorizing Provider  acetaminophen (TYLENOL) 325 MG tablet Take 2 tablets (650 mg total) by mouth every 6 (six) hours as needed for mild pain (or Fever >/= 101). 07/12/16    ArtVann, Jessica U, DO  albuterol (PROVENTIL HFA;VENTOLIN HFA) 108 (90 Base) MCG/ACT inhaler Inhale 2 puffs into the lungs every 6 (six) hours as needed for wheezing or shortness of breath.    [provider]  b complex vitamins tablet Take 1 tablet by mouth daily.    [provider]  Calcium Carb-Cholecalciferol (CALCIUM CARBONATE-VITAMIN D3 PO) Take 1 tablet by mouth daily.    [provider]  ciprofloxacin (CIPRO) 500 MG tablet Take 1 tablet (500 mg total) by mouth in the morning and at bedtime for 21 days. 10/14/20 11/04/20  Vu, Gershon Musselrung T, MD  DULoxetine (CYMBALTA) 60 MG capsule Take 60 mg by mouth 2 (two) times daily.    [provider]  famotidine (PEPCID) 20  MG tablet Take 20 mg daily 01/12/20   [place], Peter M, MD  ferrous sulfate 325 (65 FE) MG tablet Take 325 mg by mouth daily with breakfast.    [provider]  fluticasone (FLONASE) 50 MCG/ACT nasal spray Place 2 sprays into both nostrils daily as needed for rhinitis.    [provider]  folic acid (FOLVITE) 1 MG tablet Take 1 mg by mouth daily.    [provider]  furosemide (LASIX) 40 MG tablet Take 1 tablet (40 mg total) by mouth as directed. 09/06/20   [place], Peter M, MD  gabapentin (NEURONTIN) 300 MG capsule Take 3 capsules (900 mg total) by mouth in the morning, 2 capsules (600 mg total) in the early  afternoon, and 4 capsules (1200 mg total) in the evening.    [provider]  lidocaine (LIDODERM) 5 % Place 1 patch onto the skin daily as needed (for pain). Remove & Discard patch within 12 hours or as directed by MD    [provider]  linezolid (ZYVOX) 600 MG tablet Take 1 tablet (600 mg total) by mouth 2 (two) times daily for 7 days. 10/26/20 11/02/20  Vu, Gershon Mussel T, MD  magnesium oxide (MAG-OX) 400 MG tablet Take 400 mg by mouth 2 (two) times daily.    [provider]  metFORMIN (GLUCOPHAGE) 500 MG tablet Take 500 mg by mouth 2 (two) times daily with a meal.    [provider]  methotrexate 50 MG/2ML injection Inject 1 mL into the skin once a week.     [provider]  montelukast (SINGULAIR) 10 MG tablet Take 10 mg by mouth daily. 12/30/17   [provider]  Potassium Chloride Crys ER (K-DUR PO) Take 40 mEq by mouth 3 (three) times daily.    [provider]  predniSONE (DELTASONE) 5 MG tablet Take 20 mg by mouth daily with breakfast.     [provider]  PRESCRIPTION MEDICATION by Intrathecal route as directed. ziconotide (PF) 25 mcg/mL, BUPivacaine (PF) 18 mg/mL 20 mL intrathecal pump refill 01/29/18   [provider]  sulfamethoxazole-trimethoprim (BACTRIM,SEPTRA) 400-80 MG tablet TAKE 1 TABLET 3 TIMES A WEEK 11/05/17   [provider]  tapentadol (NUCYNTA) 50 MG tablet Take 50 mg by mouth 2 (two) times daily.    [provider]  tizanidine (ZANAFLEX) 2 MG capsule TAKE ONE CAPSULE BY MOUTH 3 TIMES A DAY AS NEEDED FOR MUSCLE SPASMS 05/10/17   [provider]  topiramate (TOPAMAX) 100 MG tablet Take 100 mg by mouth daily. 01/14/18   [provider]  traZODone (DESYREL) 100 MG tablet Take 1 tablet (100 mg total) by mouth at bedtime. 01/12/20   [place], Peter M, MD  vitamin B-12 (CYANOCOBALAMIN) 1000 MCG tablet Take 1,000 mcg by mouth daily.    [provider]  Vitamin D,  Ergocalciferol, (DRISDOL) 50000 units CAPS capsule Take 50,000 Units by mouth every 7 (seven) days. Pt takes on Friday.    [provider]  ZICONOTIDE ACETATE IT by Intrathecal route every 3 (three) months.    [provider]    Allergies    Ibuprofen and Penicillins  Review of Systems   Review of Systems  Constitutional: Negative for appetite change and fatigue.  HENT: Negative for congestion, ear discharge and sinus pressure.   Eyes: Negative for discharge.  Respiratory: Negative for cough.   Cardiovascular: Negative for chest pain.  Gastrointestinal: Negative for abdominal pain and diarrhea.  Genitourinary: Negative for frequency  and hematuria.  Musculoskeletal: Negative for back pain.  Skin: Negative for rash.  Neurological: Positive for weakness. Negative for seizures and headaches.  Psychiatric/Behavioral: Negative for hallucinations.    Physical Exam Updated Vital Signs BP 94/65   Pulse 60   Temp 98.2 F (36.8 C) (Oral)   Resp 13   Ht 5\' 6"  (1.676 m)   Wt 91.3 kg   SpO2 97%   BMI 32.49 kg/m   Physical Exam Vitals reviewed.  Constitutional:      Appearance: She is well-developed.  HENT:     Head: Normocephalic.     Nose: Nose normal.  Eyes:     General: No scleral icterus.    Extraocular Movements: EOM normal.     Conjunctiva/sclera: Conjunctivae normal.  Neck:     Thyroid: No thyromegaly.  Cardiovascular:     Rate and Rhythm: Normal rate and regular rhythm.     Heart sounds: No murmur heard. No friction rub. No gallop.   Pulmonary:     Breath sounds: No stridor. No wheezing or rales.  Chest:     Chest wall: No tenderness.  Abdominal:     General: There is no distension.     Tenderness: There is no abdominal tenderness. There is no rebound.  Musculoskeletal:        General: No edema. Normal range of motion.     Cervical back: Neck supple.  Lymphadenopathy:     Cervical: No cervical adenopathy.  Skin:    Findings: No erythema or  rash.  Neurological:     Mental Status: She is alert and oriented to person, place, and time.     Motor: No abnormal muscle tone.     Coordination: Coordination normal.  Psychiatric:        Mood and Affect: Mood and affect normal.        Behavior: Behavior normal.     ED Results / Procedures / Treatments   Labs (all labs ordered are listed, but only abnormal results are displayed) Labs Reviewed  RESP PANEL BY RT-PCR (FLU A&B, COVID) ARPGX2 - Abnormal; Notable for the following components:      Result Value   SARS Coronavirus 2 by RT PCR POSITIVE (*)    All other components within normal limits  LACTIC ACID, PLASMA - Abnormal; Notable for the following components:   Lactic Acid, Venous 2.2 (*)    All other components within normal limits  COMPREHENSIVE METABOLIC PANEL - Abnormal; Notable for the following components:   Potassium 3.3 (*)    Glucose, Bld 110 (*)    BUN 22 (*)    Creatinine, Ser 1.28 (*)    Calcium 8.7 (*)    Total Protein 5.8 (*)    Albumin 2.5 (*)    AST 61 (*)    GFR, Estimated 50 (*)    All other components within normal limits  CBC WITH DIFFERENTIAL/PLATELET - Abnormal; Notable for the following components:   MCH 25.4 (*)    MCHC 29.0 (*)    RDW 19.2 (*)    Abs Immature Granulocytes 0.26 (*)    All other components within normal limits  CULTURE, BLOOD (ROUTINE X 2)  CULTURE, BLOOD (ROUTINE X 2)  URINE CULTURE  LACTIC ACID, PLASMA  PROTIME-INR  APTT  URINALYSIS, ROUTINE W REFLEX MICROSCOPIC  POC URINE PREG, ED    EKG None  Radiology CT Head Wo Contrast  Result Date: 11/01/2020 CLINICAL DATA:  Headache over the last week EXAM: CT HEAD  WITHOUT CONTRAST TECHNIQUE: Contiguous axial images were obtained from the base of the skull through the vertex without intravenous contrast. COMPARISON:  06/01/2013 FINDINGS: Brain: The brainstem and cerebellum are normal. Are there is an approximately 2 cm region of newly seen low-attenuation in the right frontal  white matter. This was not visible in 2014. Question other subtle areas of white matter low-density in both hemispheres. No evidence of mass lesion or mass effect, hemorrhage, hydrocephalus or extra-axial collection. Vascular: No abnormal vascular finding. Skull: Normal Sinuses/Orbits: Clear/normal Other: None IMPRESSION: 2 cm region of newly seen low-attenuation in the right frontal white matter. This was not visible in 2014. Question other subtle areas of white matter low-density in both hemispheres. These findings are nonspecific, but could relate to chronic small-vessel ischemic change. The possibility of demyelinating disease is also possible. White matter mass lesion not completely excluded. MRI of the brain with and without contrast would be suggested for further evaluation. Electronically Signed   By: Paulina FusiMark  Shogry M.D.   On: 11/01/2020 13:03   DG Chest Port 1 View  Result Date: 11/01/2020 CLINICAL DATA:  Cough with confusion EXAM: PORTABLE CHEST 1 VIEW COMPARISON:  January 12, 2017 FINDINGS: Lungs are clear. The heart size and pulmonary vascularity are normal. No adenopathy. No bone lesions. IMPRESSION: Lungs clear.  Cardiac silhouette normal. Electronically Signed   By: Bretta BangWilliam  Woodruff III M.D.   On: 11/01/2020 13:49    Procedures Procedures (including critical care time)  Medications Ordered in ED Medications  lactated ringers infusion (has no administration in time range)  vancomycin (VANCOREADY) IVPB 2000 mg/400 mL (2,000 mg Intravenous New Bag/Given 11/01/20 1324)    Followed by  vancomycin (VANCOCIN) IVPB 1000 mg/200 mL premix (has no administration in time range)  ceFEPIme (MAXIPIME) 2 g in sodium chloride 0.9 % 100 mL IVPB (0 g Intravenous Stopped 11/01/20 1452)  sodium chloride 0.9 % bolus 3,000 mL (3,000 mLs Intravenous New Bag/Given 11/01/20 1249)    ED Course  I have reviewed the triage vital signs and the nursing notes.  Pertinent labs & imaging results that were available  during my care of the patient were reviewed by me and considered in my medical decision making (see chart for details).    CRITICAL CARE Performed by: Bethann BerkshireJoseph Haydyn Girvan Total critical care time:45 minutes Critical care time was exclusive of separately billable procedures and treating other patients. Critical care was necessary to treat or prevent imminent or life-threatening deterioration. Critical care was time spent personally by me on the following activities: development of treatment plan with patient and/or surrogate as well as nursing, discussions with consultants, evaluation of patient's response to treatment, examination of patient, obtaining history from patient or surrogate, ordering and performing treatments and interventions, ordering and review of laboratory studies, ordering and review of radiographic studies, pulse oximetry and re-evaluation of patient's condition. ` MDM Rules/Calculators/A&P                          Patient with dehydration and hypotension from diarrhea.  She responded some to fluids.  Patient also has positive COVID she will be admitted for continued hypotension.  Initially septic protocol was started but no obvious source of infection has been found Final Clinical Impression(s) / ED Diagnoses Final diagnoses:  Dehydration  COVID    Rx / DC Orders ED Discharge Orders    None       Bethann BerkshireZammit, Kennette Cuthrell, MD 11/01/20 [id number]

## 2020-11-01 NOTE — Progress Notes (Signed)
Notified bedside nurse of need to draw repeat lactic acid. 

## 2020-11-01 NOTE — Telephone Encounter (Signed)
Patient's husband called back and states that the patient feels sick enough that she believes she needs to go to the emergency department. Patient's husband states that they are taking her to ED now.   Sandie Ano, RN

## 2020-11-02 ENCOUNTER — Other Ambulatory Visit: Payer: Self-pay | Admitting: Internal Medicine

## 2020-11-02 DIAGNOSIS — E860 Dehydration: Secondary | ICD-10-CM | POA: Diagnosis not present

## 2020-11-02 DIAGNOSIS — R197 Diarrhea, unspecified: Secondary | ICD-10-CM | POA: Diagnosis not present

## 2020-11-02 DIAGNOSIS — U071 COVID-19: Secondary | ICD-10-CM | POA: Diagnosis not present

## 2020-11-02 DIAGNOSIS — E2740 Unspecified adrenocortical insufficiency: Secondary | ICD-10-CM | POA: Diagnosis not present

## 2020-11-02 DIAGNOSIS — I9589 Other hypotension: Secondary | ICD-10-CM

## 2020-11-02 LAB — HIV ANTIBODY (ROUTINE TESTING W REFLEX): HIV Screen 4th Generation wRfx: NONREACTIVE

## 2020-11-02 LAB — COMPREHENSIVE METABOLIC PANEL
ALT: 29 U/L (ref 0–44)
AST: 38 U/L (ref 15–41)
Albumin: 2.3 g/dL — ABNORMAL LOW (ref 3.5–5.0)
Alkaline Phosphatase: 103 U/L (ref 38–126)
Anion gap: 8 (ref 5–15)
BUN: 18 mg/dL (ref 6–20)
CO2: 21 mmol/L — ABNORMAL LOW (ref 22–32)
Calcium: 7.8 mg/dL — ABNORMAL LOW (ref 8.9–10.3)
Chloride: 115 mmol/L — ABNORMAL HIGH (ref 98–111)
Creatinine, Ser: 0.85 mg/dL (ref 0.44–1.00)
GFR, Estimated: >60 mL/min (ref >60)
Glucose, Bld: 120 mg/dL — ABNORMAL HIGH (ref 70–99)
Potassium: 3.6 mmol/L (ref 3.5–5.1)
Sodium: 144 mmol/L (ref 135–145)
Total Bilirubin: 0.6 mg/dL (ref 0.3–1.2)
Total Protein: 5.1 g/dL — ABNORMAL LOW (ref 6.5–8.1)

## 2020-11-02 LAB — HEMOGLOBIN A1C
Hgb A1c MFr Bld: 6.6 % — ABNORMAL HIGH (ref 4.8–5.6)
Mean Plasma Glucose: 142.72 mg/dL

## 2020-11-02 LAB — CBC
HCT: 39.1 % (ref 36.0–46.0)
Hemoglobin: 11.3 g/dL — ABNORMAL LOW (ref 12.0–15.0)
MCH: 25.3 pg — ABNORMAL LOW (ref 26.0–34.0)
MCHC: 28.9 g/dL — ABNORMAL LOW (ref 30.0–36.0)
MCV: 87.5 fL (ref 80.0–100.0)
Platelets: 334 10*3/uL (ref 150–400)
RBC: 4.47 MIL/uL (ref 3.87–5.11)
RDW: 19.2 % — ABNORMAL HIGH (ref 11.5–15.5)
WBC: 4.5 10*3/uL (ref 4.0–10.5)
nRBC: 0 % (ref 0.0–0.2)

## 2020-11-02 LAB — CBG MONITORING, ED
Glucose-Capillary: 92 mg/dL (ref 70–99)
Glucose-Capillary: 109 mg/dL — ABNORMAL HIGH (ref 70–99)
Glucose-Capillary: 128 mg/dL — ABNORMAL HIGH (ref 70–99)

## 2020-11-02 LAB — PROCALCITONIN: Procalcitonin: <0.1 ng/mL

## 2020-11-02 LAB — GLUCOSE, CAPILLARY: Glucose-Capillary: 142 mg/dL — ABNORMAL HIGH (ref 70–99)

## 2020-11-02 MED ORDER — TAPENTADOL HCL ER 50 MG PO TB12
50.0000 mg | ORAL_TABLET | Freq: Two times a day (BID) | ORAL | Status: DC
  Administered 2020-11-02 – 2020-11-03 (×3): 50 mg via ORAL
  Filled 2020-11-02 (×5): qty 1

## 2020-11-02 MED ORDER — PREDNISONE 10 MG PO TABS
10.0000 mg | ORAL_TABLET | Freq: Every day | ORAL | Status: DC
  Administered 2020-11-03: 10 mg via ORAL
  Filled 2020-11-02: qty 1

## 2020-11-02 NOTE — TOC Initial Note (Signed)
Transition of Care [facility]) - Initial/Assessment Note    Patient Details  Name: [name] MRN: [id number] Date of Birth: 02/09/1966  [facility] CM/SW Contact:    Annice Needy, LCSW Phone Number: 11/02/2020, 4:06 PM  Clinical Narrative:                 Patient from home with spouse. Admitted COVID+. PT recommended for SNF. SNF discussed. Patient wants to go home with HHPT. [facility] is accepting of patient's insurance. Referral made.   Expected Discharge Plan: Home w Home Health Services Barriers to Discharge: Continued Medical Work up   Patient Goals and CMS Choice Patient states their goals for this hospitalization and ongoing recovery are:: home and back to baseline      Expected Discharge Plan and Services Expected Discharge Plan: Home w Home Health Services     Post Acute Care Choice: Home Health Living arrangements for the past 2 months: Single Family Home                           HH Arranged: PT HH Agency: [facility] Date [facility] Agency Contacted: 11/02/20 Time HH Agency Contacted: [id number] Representative spoke with at [facility] Agency: Kandee Keen  Prior Living Arrangements/Services Living arrangements for the past 2 months: Single Family Home Lives with:: Spouse Patient language and need for interpreter reviewed:: Yes Do you feel safe going back to the place where you live?: Yes      Need for Family Participation in Patient Care: Yes (Comment) Care giver support system in place?: Yes (comment)   Criminal Activity/Legal Involvement Pertinent to Current Situation/Hospitalization: No - Comment as needed  Activities of Daily Living      Permission Sought/Granted                  Emotional Assessment Appearance:: Appears stated age   Affect (typically observed): Appropriate Orientation: : Oriented to Self,Oriented to Place,Oriented to  Time,Oriented to Situation Alcohol / Substance Use: Not Applicable Psych Involvement: No  (comment)  Admission diagnosis:  Hypotension [I95.9] Patient Active Problem List   Diagnosis Date Noted  . COVID-19 virus infection 11/01/2020  . Diarrhea 11/01/2020  . Dehydration 11/01/2020  . Generalized abdominal pain 10/09/2018  . Midline low back pain 09/11/2018  . Malfunction of intrathecal infusion pump 09/11/2018  . 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 Steroid-induced hyperglycemia 05/11/2018  . Weakness 05/02/2018  . Morbid obesity (HCC) 01/25/2018  . Neuropathy 01/25/2018  . Presence of intrathecal pump 08/02/2017  . Rectocele 03/09/2017  . Urinary hesitancy 03/09/2017  . Abnormal finding on imaging 12/19/2016  . Esophageal dysphagia 12/19/2016  . Current chronic use of systemic steroids 10/26/2016  . IFG (impaired fasting glucose) 10/26/2016  . Arthralgia of multiple joints 07/15/2016  . Headache 07/15/2016  . Maculopapular rash, generalized 07/15/2016  . Peripheral neuropathy 07/04/2016  . Rash 07/04/2016  . CKD (chronic kidney disease) stage 3, GFR 30-59 ml/min (HCC) 07/04/2016  . 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 Neuropathic pain of both feet 01/11/2016  . OSA (obstructive sleep apnea) 12/29/2015  . Chronic insomnia 12/29/2015  . Mixed hyperlipidemia 12/29/2015  . Obesity, Class II, BMI 35-39.9, with comorbidity 12/29/2015  . Polyneuropathy associated with underlying disease (HCC) 12/29/2015  . 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) 12/29/2015  . Class 2 drug-induced obesity with serious comorbidity and body mass index (BMI) of 35.0 to 35.9 in adult 12/29/2015  . Other mechanical complication  of implanted electronic neurostimulator of spinal cord electrode (lead), initial encounter (HCC) 08/04/2015  . Adjustment disorder with depressed mood 03/29/2015  . Hyperlipidemia 07/20/2014  . Mononeuritis 07/20/2014  . Obesity (BMI 30-39.9) 07/20/2014  . Essential hypertension 07/20/2014  . Early congenital  syphilis with symptoms 07/20/2014  . Neurocardiogenic syncope 07/06/2014  . Chronic anemia 06/08/2014  . POTS (postural orthostatic tachycardia syndrome) 06/08/2014  . Tachycardia 06/08/2014  . Hypotension 06/03/2014  . Anal fissure 09/16/2013  . Adrenal insufficiency (HCC) 05/30/2013  . Encounter for long-term (current) use of high-risk medication 05/30/2013  . Fall 05/30/2013  . Hemorrhage of rectum and anus 05/30/2013  . Hypokalemia 05/30/2013  . Leukocytosis 05/30/2013  . Other long term (current) drug therapy 05/30/2013  . Syncope and collapse 05/30/2013  . 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/30/2013  . 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/2013  . Wegner's disease (congenital syphilitic osteochondritis)   . Gastroesophageal reflux disease without esophagitis 03/07/2012  . Other diseases of trachea and bronchus 08/30/2011  . 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 Atrophy of nasal turbinates 07/25/2011  . Nasal septal perforation 07/25/2011  . Cough 03/24/2011  . 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  . 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 Sleep apnea 03/24/2011   PCP:  Elizabeth Palau, [name] Pharmacy:   [facility] [place], [place] - [phone] [place] [address] Phone: [phone] Fax: [phone]     Social Determinants of Health (SDOH) Interventions    Readmission Risk Interventions No flowsheet data found.

## 2020-11-02 NOTE — Evaluation (Signed)
Physical Therapy Evaluation Patient Details Name: [name] MRN: [id number] DOB: August 04, 1966 Today's Date: 11/02/2020   History of Present Illness  [name] is a 55 y.o. 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  She has been taking ciprofloxacin and linezolid.  Patient reports over the past week she has been having frequent loose stools, up to 5 a day.  She had nausea, but only began throwing up yesterday.  She also reports having fever.  She has had cough which is nonproductive.  She also feels she sharply more short of breath on exertion.  She has been dizzy on standing.  She has some epigastric abdominal discomfort.  This is worse when she tries to eat anything.  She does experience a lot of heartburn.    Clinical Impression  Patient demonstrates good return for supine to sitting and sit to supine in bed, c/o mild lightheadedness upon sitting up at bedside, limited to a few side steps at bedside before requesting to sit due to c/o fatigue and increasing left foot pain.  Patient will benefit from continued physical therapy in hospital and recommended venue below to increase strength, balance, endurance for safe ADLs and gait.    Follow Up Recommendations SNF;Supervision for mobility/OOB;Supervision - Intermittent    Equipment Recommendations  None recommended by PT    Recommendations for Other Services       Precautions / Restrictions Precautions Precautions: Fall Restrictions Weight Bearing Restrictions: No      Mobility  Bed Mobility Overal bed mobility: Modified Independent             General bed mobility comments: slightly labored movement    Transfers Overall transfer level: Needs assistance Equipment used: Rolling walker (2 wheeled) Transfers: Sit to/from Stand Sit to Stand: Min  assist         General transfer comment: increased time, labored movement  Ambulation/Gait Ambulation/Gait assistance: Min assist Gait Distance (Feet): 4 Feet Assistive device: Rolling walker (2 wheeled) Gait Pattern/deviations: Decreased step length - right;Decreased step length - left;Decreased stance time - left;Decreased stride length Gait velocity: decreased   General Gait Details: limited to 4-5 slow labored usteady side steps before having to sit due to c/o fatigue and left foot pain  Stairs            Wheelchair Mobility    Modified Rankin (Stroke Patients Only)       Balance Overall balance assessment: Needs assistance Sitting-balance support: Feet supported;No upper extremity supported Sitting balance-Leahy Scale: Good Sitting balance - Comments: seated at EOB   Standing balance support: During functional activity;Bilateral upper extremity supported Standing balance-Leahy Scale: Fair Standing balance comment: using RW                             Pertinent Vitals/Pain Pain Assessment: Faces Faces Pain Scale: Hurts little more Pain Location: left foot Pain Descriptors / Indicators: Sore;Discomfort;Guarding;Grimacing Pain Intervention(s): Limited activity within patient's tolerance;Monitored during session;Repositioned    Home Living Family/patient expects to be discharged to:: Private residence Living Arrangements: Spouse/significant other Available Help at Discharge: Family Type of Home: House Home Access: Stairs to enter Entrance Stairs-Rails: Right Entrance Stairs-Number of Steps: 2 Home Layout: One level Home Equipment: Tub bench;Grab bars - tub/shower;Wheelchair - Technical sales engineer - 2 wheels      Prior Function Level of Independence: Independent  with assistive device(s)         Comments: ambulate short household distances using RW, uses electric scooter for longer distances     Hand Dominance         Extremity/Trunk Assessment   Upper Extremity Assessment Upper Extremity Assessment: Generalized weakness    Lower Extremity Assessment Lower Extremity Assessment: Generalized weakness    Cervical / Trunk Assessment Cervical / Trunk Assessment: Normal  Communication   Communication: No difficulties  Cognition Arousal/Alertness: Awake/alert Behavior During Therapy: WFL for tasks assessed/performed Overall Cognitive Status: Within Functional Limits for tasks assessed                                        General Comments      Exercises     Assessment/Plan    PT Assessment Patient needs continued PT services  PT Problem List Decreased strength;Decreased activity tolerance;Decreased balance;Decreased mobility       PT Treatment Interventions DME instruction;Gait training;Stair training;Functional mobility training;Therapeutic activities;Therapeutic exercise;Balance training;Patient/family education    PT Goals (Current goals can be found in the Care Plan section)  Acute Rehab PT Goals Patient Stated Goal: return home with family to assist PT Goal Formulation: With patient Time For Goal Achievement: 11/16/20 Potential to Achieve Goals: Good    Frequency Min 3X/week   Barriers to discharge        Co-evaluation               AM-PAC PT "6 Clicks" Mobility  Outcome Measure Help needed turning from your back to your side while in a flat bed without using bedrails?: None Help needed moving from lying on your back to sitting on the side of a flat bed without using bedrails?: None Help needed moving to and from a bed to a chair (including a wheelchair)?: A Lot Help needed standing up from a chair using your arms (e.g., wheelchair or bedside chair)?: A Lot Help needed to walk in hospital room?: A Lot Help needed climbing 3-5 steps with a railing? : A Lot 6 Click Score: 16    End of Session   Activity Tolerance: Patient tolerated treatment  well;Patient limited by fatigue;Patient limited by pain Patient left: in bed;with call bell/phone within reach Nurse Communication: Mobility status PT Visit Diagnosis: Unsteadiness on feet (R26.81);Other abnormalities of gait and mobility (R26.89);Muscle weakness (generalized) (M62.81)    Time: 0258-5277 PT Time Calculation (min) (ACUTE ONLY): 30 min   Charges:   PT Evaluation $PT Eval Moderate Complexity: 1 Mod PT Treatments $Therapeutic Activity: 23-37 mins        11:25 AM, 11/02/20 Ocie Bob, MPT Physical Therapist with [facility] [phone] office [phone] mobile phone

## 2020-11-02 NOTE — Progress Notes (Signed)
Dr Victorino Dike sent me a message presuming he had clean margin with the bone.   Reflecting on what I see in terms of tissue appearance of the left foot during our visit, she doesn't  Have much if at all cellulitic change  Base on these combination, it is reasonable to have her take 1 weeks of cipro/linezolid post-op  I am going for shorter duration as she potentially has some side effect to abx that is beign investigated (see our triage nurse notes)

## 2020-11-02 NOTE — ED Notes (Signed)
Spoke with pts. Husband Hessie Diener. He is wishing pt would go to rehab. He is going to speak with wife and attempt to convince pt. To go. Husband is also asking if an orthopedic doctor could see his wife since she will be unable to see her's on Thursday.

## 2020-11-02 NOTE — ED Notes (Signed)
Pt feet wrapped and dressed

## 2020-11-02 NOTE — ED Notes (Signed)
Attempted report x1. 

## 2020-11-02 NOTE — Progress Notes (Signed)
PROGRESS NOTE    Stacey CromerLisa Kott  WJX:[id number] DOB: Apr 03, 1966 DOA: 11/01/2020 PCP: Elizabeth PalauAnderson, [name], [name]    Brief Narrative:  [name] is a 55 y.o. 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  She has been taking ciprofloxacin and linezolid.  Patient reports over the past week she has been having frequent loose stools, up to 5 a day.  She had nausea, but only began throwing up yesterday.  She also reports having fever.  She has had cough which is nonproductive.  She also feels she sharply more short of breath on exertion.  She has been dizzy on standing.  She has some epigastric abdominal discomfort.  This is worse when she tries to eat anything.  She does experience a lot of heartburn.   Assessment & Plan:   Active Problems:   Adrenal insufficiency (HCC)   OSA (obstructive sleep apnea)   Hypotension   Obesity (BMI 30-39.9)   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)   COVID-19 virus infection   Diarrhea   Dehydration   Hypotension -Sepsis ruled out -Suspect that her low blood pressure is related to volume depletion from persistent diarrhea -Blood pressures have improved with IV hydration -She does not have any leukocytosis or fever at this time -She may have also have a component of adrenal insufficiency and was started on stress dose steroids -Since blood pressures are better, she will be transition back to her maintenance dose of prednisone  COVID-19 infection -Patient did test positive for COVID-19 -Chest x-ray does not show any evidence of pneumonia -She is on minimal oxygen -Check inflammatory markers -It appears that her symptoms have been present for more than 7 days, would not prescribe remdesivir  Diarrhea -Possibly related to COVID-19 -Patient continues to have loose stools  today, unfortunately stool samples were not sent by staff. -Asked staff that next time she has a bowel movement, the stool samples will need to be sent -We will discontinue IV fluids and check frequency of stools -I am concerned if she keeps having loose stools, she may become dehydrated again, continue to monitor  Recent left transmetatarsal amputation due to osteomyelitis -She is chronically on ciprofloxacin and linezolid -Discussed with her primary infectious disease MD who recommended holding these antibiotics until she can follow-up with him  Wegener's granulomatosis -On chronic prednisone therapy at 10 mg/day  Acute kidney injury -Suspect is related to dehydration -She she has good urine output -Improved with IV fluids  Hypokalemia -Replace   DVT prophylaxis: enoxaparin (LOVENOX) injection 40 mg Start: 11/01/20 2100  Code Status: Full code Family Communication: discussed with husband 1/11 Disposition Plan: Status is: Inpatient  Remains inpatient appropriate because:Ongoing diagnostic testing needed not appropriate for outpatient work up   Dispo: The patient is from: Home              Anticipated d/c is to: Home              Anticipated d/c date is: 1 day              Patient currently is not medically stable to d/c.    Consultants:     Procedures:     Antimicrobials:       Subjective: Staff reports continued loose stools today.  P.o. intake has been poor.  Blood pressures are better today.  Objective: Vitals:   11/02/20 1430 11/02/20 1500  11/02/20 1630 11/02/20 1700  BP: 130/88 120/85 122/82 131/89  Pulse: 78 72 70   Resp: (!) 21 18 18 20   Temp:      TempSrc:      SpO2: 98% 97% 98%   Weight:      Height:        Intake/Output Summary (Last 24 hours) at 11/02/2020 1758 Last data filed at 11/02/2020 1502 Gross per 24 hour  Intake 1528.59 ml  Output --  Net 1528.59 ml   Filed Weights   11/01/20 1140  Weight: 91.3 kg     Examination:  General exam: Appears calm and comfortable  Respiratory system: Clear to auscultation. Respiratory effort normal. Cardiovascular system: S1 & S2 heard, RRR. No JVD, murmurs, rubs, gallops or clicks. No pedal edema. Gastrointestinal system: Abdomen is nondistended, soft and nontender. No organomegaly or masses felt. Normal bowel sounds heard. Central nervous system: Alert and oriented. No focal neurological deficits. Extremities: left TMA with healing incision, right great toe amputation with healing incision Skin: No rashes, lesions or ulcers Psychiatry: Judgement and insight appear normal. Mood & affect appropriate.     Data Reviewed: I have personally reviewed following labs and imaging studies  CBC: Recent Labs  Lab 11/01/20 1152 11/02/20 0528  WBC 8.2 4.5  NEUTROABS 5.6  --   HGB 12.2 11.3*  HCT 42.0 39.1  MCV 87.3 87.5  PLT 368 334   Basic Metabolic Panel: Recent Labs  Lab 11/01/20 1152 11/02/20 0528  NA 138 144  K 3.3* 3.6  CL 106 115*  CO2 22 21*  GLUCOSE 110* 120*  BUN 22* 18  CREATININE 1.28* 0.85  CALCIUM 8.7* 7.8*  MG 1.9  --    GFR: Estimated Creatinine Clearance: 86.1 mL/min (by C-G formula based on SCr of 0.85 mg/dL). Liver Function Tests: Recent Labs  Lab 11/01/20 1152 11/02/20 0528  AST 61* 38  ALT 34 29  ALKPHOS 102 103  BILITOT 0.6 0.6  PROT 5.8* 5.1*  ALBUMIN 2.5* 2.3*   No results for input(s): LIPASE, AMYLASE in the last 168 hours. No results for input(s): AMMONIA in the last 168 hours. Coagulation Profile: Recent Labs  Lab 11/01/20 1152  INR 1.0   Cardiac Enzymes: No results for input(s): CKTOTAL, CKMB, CKMBINDEX, TROPONINI in the last 168 hours. BNP (last 3 results) No results for input(s): PROBNP in the last 8760 hours. HbA1C: Recent Labs    11/01/20 1152  HGBA1C 6.6*   CBG: Recent Labs  Lab 11/01/20 2039 11/02/20 0759 11/02/20 1321 11/02/20 1722  GLUCAP 87 92 109* 128*   Lipid Profile: No  results for input(s): CHOL, HDL, LDLCALC, TRIG, CHOLHDL, LDLDIRECT in the last 72 hours. Thyroid Function Tests: No results for input(s): TSH, T4TOTAL, FREET4, T3FREE, THYROIDAB in the last 72 hours. Anemia Panel: Recent Labs    11/01/20 1152  FERRITIN 141   Sepsis Labs: Recent Labs  Lab 11/01/20 1152 11/01/20 1240 11/01/20 1400 11/02/20 0528  PROCALCITON <0.10  --   --  <0.10  LATICACIDVEN  --  2.2* 1.7  --     Recent Results (from the past 240 hour(s))  Resp Panel by RT-PCR (Flu A&B, Covid) Nasopharyngeal Swab     Status: Abnormal   Collection Time: 11/01/20 12:15 PM   Specimen: Nasopharyngeal Swab; Nasopharyngeal(NP) swabs in vial transport medium  Result Value Ref Range Status   SARS Coronavirus 2 by RT PCR POSITIVE (A) NEGATIVE Final    Comment: RESULT CALLED TO, READ BACK BY AND  VERIFIED WITH: DR. ZAMMIT @1437  11/01/2020 KAY (NOTE) SARS-CoV-2 target nucleic acids are DETECTED.  The SARS-CoV-2 RNA is generally detectable in upper respiratory specimens during the acute phase of infection. Positive results are indicative of the presence of the identified virus, but do not rule out bacterial infection or co-infection with other pathogens not detected by the test. Clinical correlation with patient history and other diagnostic information is necessary to determine patient infection status. The expected result is Negative.  Fact Sheet for Patients: 12/30/2020  Fact Sheet for Healthcare Providers: BloggerCourse.com  This test is not yet approved or cleared by the [employer] FDA and  has been authorized for detection and/or diagnosis of SARS-CoV-2 by FDA under an Emergency Use Authorization (EUA).  This EUA will remain in effect (meaning this test can be u sed) for the duration of  the COVID-19 declaration under Section 564(b)(1) of the Act, 21 U.S.C. section 360bbb-3(b)(1), unless the authorization  is terminated or revoked sooner.     Influenza A by PCR NEGATIVE NEGATIVE Final   Influenza B by PCR NEGATIVE NEGATIVE Final    Comment: (NOTE) The Xpert Xpress SARS-CoV-2/FLU/RSV plus assay is intended as an aid in the diagnosis of influenza from Nasopharyngeal swab specimens and should not be used as a sole basis for treatment. Nasal washings and aspirates are unacceptable for Xpert Xpress SARS-CoV-2/FLU/RSV testing.  Fact Sheet for Patients: [place]  Fact Sheet for Healthcare Providers: BloggerCourse.com  This test is not yet approved or cleared by the [employer] FDA and has been authorized for detection and/or diagnosis of SARS-CoV-2 by FDA under an Emergency Use Authorization (EUA). This EUA will remain in effect (meaning this test can be used) for the duration of the COVID-19 declaration under Section 564(b)(1) of the Act, 21 U.S.C. section 360bbb-3(b)(1), unless the authorization is terminated or revoked.  Performed at [facility], [address] [place], [place]   Blood Culture (routine x 2)     Status: None (Preliminary result)   Collection Time: 11/01/20 12:28 PM   Specimen: BLOOD  Result Value Ref Range Status   Specimen Description BLOOD BLOOD RIGHT WRIST  Final   Special Requests   Final    Blood Culture adequate volume BOTTLES DRAWN AEROBIC AND ANAEROBIC   Culture   Final    NO GROWTH < 24 HOURS Performed at [facility], [address], [place], [place]    Report Status PENDING  Incomplete  Blood Culture (routine x 2)     Status: None (Preliminary result)   Collection Time: 11/01/20 12:42 PM   Specimen: BLOOD  Result Value Ref Range Status   Specimen Description BLOOD BLOOD LEFT HAND  Final   Special Requests   Final    Blood Culture results may not be optimal due to an inadequate volume of blood received in culture bottles BOTTLES DRAWN AEROBIC AND ANAEROBIC   Culture    Final    NO GROWTH < 24 HOURS Performed at [facility], [address], [place], [place]    Report Status PENDING  Incomplete         Radiology Studies: CT Head Wo Contrast  Result Date: 11/01/2020 CLINICAL DATA:  Headache over the last week EXAM: CT HEAD WITHOUT CONTRAST TECHNIQUE: Contiguous axial images were obtained from the base of the skull through the vertex without intravenous contrast. COMPARISON:  06/01/2013 FINDINGS: Brain: The brainstem and cerebellum are normal. Are there is an approximately 2 cm region of newly seen  low-attenuation in the right frontal white matter. This was not visible in 2014. Question other subtle areas of white matter low-density in both hemispheres. No evidence of mass lesion or mass effect, hemorrhage, hydrocephalus or extra-axial collection. Vascular: No abnormal vascular finding. Skull: Normal Sinuses/Orbits: Clear/normal Other: None IMPRESSION: 2 cm region of newly seen low-attenuation in the right frontal white matter. This was not visible in 2014. Question other subtle areas of white matter low-density in both hemispheres. These findings are nonspecific, but could relate to chronic small-vessel ischemic change. The possibility of demyelinating disease is also possible. White matter mass lesion not completely excluded. MRI of the brain with and without contrast would be suggested for further evaluation. Electronically Signed   By: Paulina Fusi M.D.   On: 11/01/2020 13:03   DG Chest Port 1 View  Result Date: 11/01/2020 CLINICAL DATA:  Cough with confusion EXAM: PORTABLE CHEST 1 VIEW COMPARISON:  January 12, 2017 FINDINGS: Lungs are clear. The heart size and pulmonary vascularity are normal. No adenopathy. No bone lesions. IMPRESSION: Lungs clear.  Cardiac silhouette normal. Electronically Signed   By: Bretta Bang III M.D.   On: 11/01/2020 13:49        Scheduled Meds: . DULoxetine  60 mg Oral BID  . enoxaparin (LOVENOX) injection  40 mg  Subcutaneous Q24H  . gabapentin  1,200 mg Oral QHS  . gabapentin  600 mg Oral q1600  . gabapentin  900 mg Oral Q breakfast  . insulin aspart  0-15 Units Subcutaneous TID WC  . insulin aspart  0-5 Units Subcutaneous QHS  . montelukast  10 mg Oral Daily  . [START ON 11/03/2020] predniSONE  10 mg Oral Q breakfast  . tapentadol  50 mg Oral Q12H  . topiramate  100 mg Oral Daily  . traZODone  100 mg Oral QHS   Continuous Infusions:   LOS: 1 day    Time spent:    Erick Blinks, MD [facility]   If 7PM-7AM, please contact night-coverage [email or web address]  11/02/2020, 5:58 PM

## 2020-11-02 NOTE — Plan of Care (Signed)
  Problem: Acute Rehab PT Goals(only PT should resolve) Goal: Pt Will Go Supine/Side To Sit Outcome: Progressing Flowsheets (Taken 11/02/2020 1127) Pt will go Supine/Side to Sit:  Independently  with modified independence Goal: Patient Will Transfer Sit To/From Stand Outcome: Progressing Flowsheets (Taken 11/02/2020 1127) Patient will transfer sit to/from stand: with supervision Goal: Pt Will Transfer Bed To Chair/Chair To Bed Outcome: Progressing Flowsheets (Taken 11/02/2020 1127) Pt will Transfer Bed to Chair/Chair to Bed: with supervision Goal: Pt Will Ambulate Outcome: Progressing Flowsheets (Taken 11/02/2020 1127) Pt will Ambulate:  25 feet  with min guard assist  with rolling walker   11:27 AM, 11/02/20 Ocie Bob, MPT Physical Therapist with [facility] [phone] office [phone] mobile phone

## 2020-11-03 ENCOUNTER — Encounter (HOSPITAL_BASED_OUTPATIENT_CLINIC_OR_DEPARTMENT_OTHER): Payer: BC Managed Care – PPO | Admitting: Physician Assistant

## 2020-11-03 DIAGNOSIS — I9589 Other hypotension: Secondary | ICD-10-CM | POA: Diagnosis not present

## 2020-11-03 DIAGNOSIS — E669 Obesity, unspecified: Secondary | ICD-10-CM

## 2020-11-03 DIAGNOSIS — E860 Dehydration: Secondary | ICD-10-CM | POA: Diagnosis not present

## 2020-11-03 DIAGNOSIS — U071 COVID-19: Secondary | ICD-10-CM | POA: Diagnosis not present

## 2020-11-03 LAB — CBC
HCT: 36.3 % (ref 36.0–46.0)
Hemoglobin: 10.7 g/dL — ABNORMAL LOW (ref 12.0–15.0)
MCH: 25.1 pg — ABNORMAL LOW (ref 26.0–34.0)
MCHC: 29.5 g/dL — ABNORMAL LOW (ref 30.0–36.0)
MCV: 85.2 fL (ref 80.0–100.0)
Platelets: 321 10*3/uL (ref 150–400)
RBC: 4.26 MIL/uL (ref 3.87–5.11)
RDW: 19 % — ABNORMAL HIGH (ref 11.5–15.5)
WBC: 6.7 10*3/uL (ref 4.0–10.5)
nRBC: 0 % (ref 0.0–0.2)

## 2020-11-03 LAB — GLUCOSE, CAPILLARY
Glucose-Capillary: 53 mg/dL — ABNORMAL LOW (ref 70–99)
Glucose-Capillary: 71 mg/dL (ref 70–99)
Glucose-Capillary: 137 mg/dL — ABNORMAL HIGH (ref 70–99)

## 2020-11-03 LAB — BASIC METABOLIC PANEL
Anion gap: 8 (ref 5–15)
BUN: 14 mg/dL (ref 6–20)
CO2: 24 mmol/L (ref 22–32)
Calcium: 8.1 mg/dL — ABNORMAL LOW (ref 8.9–10.3)
Chloride: 114 mmol/L — ABNORMAL HIGH (ref 98–111)
Creatinine, Ser: 0.73 mg/dL (ref 0.44–1.00)
GFR, Estimated: >60 mL/min (ref >60)
Glucose, Bld: 69 mg/dL — ABNORMAL LOW (ref 70–99)
Potassium: 2.8 mmol/L — ABNORMAL LOW (ref 3.5–5.1)
Sodium: 146 mmol/L — ABNORMAL HIGH (ref 135–145)

## 2020-11-03 LAB — URINE CULTURE: Culture: NO GROWTH

## 2020-11-03 MED ORDER — POTASSIUM CHLORIDE CRYS ER 20 MEQ PO TBCR
40.0000 meq | EXTENDED_RELEASE_TABLET | Freq: Once | ORAL | Status: AC
  Administered 2020-11-03: 40 meq via ORAL
  Filled 2020-11-03: qty 2

## 2020-11-03 MED ORDER — INSULIN ASPART 100 UNIT/ML ~~LOC~~ SOLN
0.0000 [IU] | Freq: Three times a day (TID) | SUBCUTANEOUS | Status: DC
  Administered 2020-11-03: 1 [IU] via SUBCUTANEOUS

## 2020-11-03 NOTE — Discharge Summary (Signed)
Physician Discharge Summary  Stacey CromerLisa [name] AVW:[id number] DOB: [birth year] DOA: 11/01/2020  PCP: Elizabeth PalauAnderson, [name], [name]  Admit date: 11/01/2020 Discharge date: 11/03/2020  Admitted From: Home Disposition:  Home   Recommendations for Outpatient Follow-up:  1. Follow up with PCP in 1-2 weeks 2. 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 She has been taking ciprofloxacin and linezolid. Patient reports over the past week she has been having frequent loose stools, up to 5 a day. She had nausea, but only began throwing up yesterday. She also reports having fever. She has had cough which is nonproductive. She also feels she sharply more short of breath on exertion. She has been dizzy on standing. She has some epigastric abdominal discomfort. This is worse when she tries to eat anything. She does experience a lot of heartburn.  Discharge Diagnoses:  Hypotension -Sepsis ruled out -Suspect that her low blood pressure is related to volume depletion from persistent diarrhea -Blood pressures have improved with IV hydration -She does not have any leukocytosis or fever at this time -She may have also have a component of adrenal insufficiency and was started on stress dose steroids which were weaned down with continued hemodynamic stability -Since blood pressures are better, she will be transition back to her maintenance dose of prednisone 10 mg daily -BP remained stable off IVF and back on homed dose prednisone  COVID-19 infection -Patient did test positive for COVID-19 -Chest x-ray does not show  any evidence of pneumonia -She is on minimal oxygen>>RA at time of dc -Check inflammatory markers--not significantly elevated -she has been vaccinated -It appears that her symptoms have been present for more than 7 days, would not prescribe remdesivir  Diarrhea -related to COVID-19 enteritis -Patient continues to have loose stools although significantly decreased, unfortunately stool samples were not sent by staff. 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 Kerry [name] Discussed with her primary infectious disease MD who recommended holding these antibiotics until she can follow-up with him  Wegener's granulomatosis -On chronic prednisone therapy at 10 mg/day  Acute kidney injury - related to dehydration -baseline creatinine 0.7-0.8 -serum creatinine peaked 1.3 -She she has good urine output -Improved with IV fluids  Hypokalemia -Replace Discharge Instructions   Allergies as of 11/03/2020      Reactions   Ibuprofen Other (See Comments)   Pt is unable to take this due to kidney problems.   Other reaction(s): Other Not to take with other medications   Penicillins Rash, Other (See Comments)   Has patient had a PCN reaction causing immediate rash, facial/tongue/throat swelling, SOB or lightheadedness with hypotension: No Has patient had a PCN reaction causing severe rash involving mucus membranes or skin necrosis: No Has patient had a PCN reaction that required hospitalization No Has patient had a PCN reaction occurring within the last 10 years: No If all of the above answers are "NO", then may proceed with Cephalosporin use.  Medication List    STOP taking these medications    ciprofloxacin 500 MG tablet Commonly known as: CIPRO   linezolid 600 MG tablet Commonly known as: ZYVOX     TAKE these medications   acetaminophen 325 MG tablet Commonly known as: TYLENOL Take 2 tablets (650 mg total) by mouth every 6 (six) hours as needed for mild pain (or Fever >/= 101).   albuterol 108 (90 Base) MCG/ACT inhaler Commonly known as: VENTOLIN HFA Inhale 2 puffs into the lungs every 6 (six) hours as needed for wheezing or shortness of breath.   b complex vitamins tablet Take 1 tablet by mouth daily.   CALCIUM CARBONATE-VITAMIN D3 PO Take 1 tablet by mouth daily.   DULoxetine 60 MG capsule Commonly known as: CYMBALTA Take 60 mg by mouth 2 (two) times daily.   famotidine 20 MG tablet Commonly known as: PEPCID Take 20 mg daily   ferrous sulfate 325 (65 FE) MG tablet Take 325 mg by mouth daily with breakfast.   fluticasone 50 MCG/ACT nasal spray Commonly known as: FLONASE Place 2 sprays into both nostrils daily as needed for rhinitis.   folic acid 1 MG tablet Commonly known as: FOLVITE Take 2 mg by mouth daily.   furosemide 40 MG tablet Commonly known as: LASIX Take 1 tablet (40 mg total) by mouth as directed.   gabapentin 300 MG capsule Commonly known as: NEURONTIN Take 600-900 mg by mouth See admin instructions. Take 3 capsules (900 mg total) by mouth in the morning, 2 capsules (600 mg total) in the early afternoon, and 4 capsules (1200 mg total) in the evening.   K-DUR PO Take 40 mEq by mouth 3 (three) times daily.   lidocaine 5 % Commonly known as: LIDODERM Place 1 patch onto the skin daily as needed (for pain). Remove & Discard patch within 12 hours or as directed by MD   magnesium oxide 400 MG tablet Commonly known as: MAG-OX Take 400 mg by mouth 2 (two) times daily.   metFORMIN 500 MG tablet Commonly known as: GLUCOPHAGE Take 500 mg by mouth 2 (two) times daily with a meal.   methotrexate 50 MG/2ML injection Inject 1 mL into the  skin once a week.   montelukast 10 MG tablet Commonly known as: SINGULAIR Take 10 mg by mouth daily.   tapentadol 50 MG tablet Commonly known as: NUCYNTA Take 50 mg by mouth 2 (two) times daily.   tizanidine 2 MG capsule Commonly known as: ZANAFLEX Take 2 mg by mouth 3 (three) times daily as needed for muscle spasms.   topiramate 100 MG tablet Commonly known as: TOPAMAX Take 100 mg by mouth daily.   traZODone 100 MG tablet Commonly known as: DESYREL Take 1 tablet (100 mg total) by mouth at bedtime.   vitamin B-12 1000 MCG tablet Commonly known as: CYANOCOBALAMIN Take 1,000 mcg by mouth daily.   Vitamin D (Ergocalciferol) 1.25 MG (50000 UNIT) Caps capsule Commonly known as: DRISDOL Take 50,000 Units by mouth every 7 (seven) days. Pt takes on Friday.   ZICONOTIDE ACETATE IT by Intrathecal route every 3 (three) months. Unknown dose       Allergies  Allergen Reactions  . Ibuprofen Other (See Comments)    Pt is unable to take this due to kidney problems.   Other reaction(s): Other Not to take with other medications   . Penicillins Rash and Other (See Comments)    Has patient had a PCN reaction causing immediate rash, facial/tongue/throat swelling, SOB  or lightheadedness with hypotension: No Has patient had a PCN reaction causing severe rash involving mucus membranes or skin necrosis: No Has patient had a PCN reaction that required hospitalization No Has patient had a PCN reaction occurring within the last 10 years: No If all of the above answers are "NO", then may proceed with Cephalosporin use.    Consultations:  none   Procedures/Studies: CT Head Wo Contrast  Result Date: 11/01/2020 CLINICAL DATA:  Headache over the last week EXAM: CT HEAD WITHOUT CONTRAST TECHNIQUE: Contiguous axial images were obtained from the base of the skull through the vertex without intravenous contrast. COMPARISON:  06/01/2013 FINDINGS: Brain: The brainstem and cerebellum are normal.  Are there is an approximately 2 cm region of newly seen low-attenuation in the right frontal white matter. This was not visible in 2014. Question other subtle areas of white matter low-density in both hemispheres. No evidence of mass lesion or mass effect, hemorrhage, hydrocephalus or extra-axial collection. Vascular: No abnormal vascular finding. Skull: Normal Sinuses/Orbits: Clear/normal Other: None IMPRESSION: 2 cm region of newly seen low-attenuation in the right frontal white matter. This was not visible in 2014. Question other subtle areas of white matter low-density in both hemispheres. These findings are nonspecific, but could relate to chronic small-vessel ischemic change. The possibility of demyelinating disease is also possible. White matter mass lesion not completely excluded. MRI of the brain with and without contrast would be suggested for further evaluation. Electronically Signed   By: Paulina Fusi M.D.   On: 11/01/2020 13:03   DG Chest Port 1 View  Result Date: 11/01/2020 CLINICAL DATA:  Cough with confusion EXAM: PORTABLE CHEST 1 VIEW COMPARISON:  January 12, 2017 FINDINGS: Lungs are clear. The heart size and pulmonary vascularity are normal. No adenopathy. No bone lesions. IMPRESSION: Lungs clear.  Cardiac silhouette normal. Electronically Signed   By: Bretta Bang III M.D.   On: 11/01/2020 13:49   DG Foot Complete Left  Result Date: 10/08/2020 CLINICAL DATA:  Diabetic foot ulcer EXAM: LEFT FOOT - COMPLETE 3+ VIEW COMPARISON:  None. FINDINGS: Diffuse soft tissue swelling. Previous partial amputation is a of the first and third digits. Bony destructive change involving the head of the first metatarsal. Bony fragments at the location of the third proximal phalanx base. Irregular lucency involving distal shaft and head of fifth metatarsal. Probable pathologic fracture through the neck of the fifth metatarsal. Small erosion lateral base of the second proximal phalanx. Radiopaque material or  soft tissue calcification adjacent to the fifth metatarsal. Small plantar calcaneal spur. IMPRESSION: 1. Previous first and third digit amputation. Bony destructive changes at the head of the first metatarsal, in the region of base of third proximal phalanx, and involving the head and neck of the fifth metatarsal, concerning for osteomyelitis. Suspected small erosion at the lateral base of second proximal phalanx also suspicious for osteomyelitis. 2. Suspected pathologic fracture involving the neck of the fifth metatarsal. These results will be called to the ordering clinician or representative by the Radiologist Assistant, and communication documented in the PACS or [employer]. Electronically Signed   By: Jasmine Pang M.D.   On: 10/08/2020 23:00         Discharge Exam: Vitals:   11/02/20 2100 11/03/20 0400  BP: 129/80 131/78  Pulse: 83 84  Resp: 17 14  Temp: 98.2 F (36.8 C) 98.4 F (36.9 C)  SpO2: 97% 97%   Vitals:   11/02/20 1700 11/02/20 1856 11/02/20 2100 11/03/20 0400  BP: 131/89 Marland Kitchen)  141/83 129/80 131/78  Pulse:  67 83 84  Resp: 20 17 17 14   Temp:  99.1 F (37.3 C) 98.2 F (36.8 C) 98.4 F (36.9 C)  TempSrc:  Oral Oral Oral  SpO2:  98% 97% 97%  Weight:      Height:        General: Pt is alert, awake, not in acute distress Cardiovascular: RRR, S1/S2 +, no rubs, no gallops Respiratory: CTA bilaterally, no wheezing, no rhonchi Abdominal: Soft, NT, ND, bowel sounds + Extremities: no edema, no cyanosis   The results of significant diagnostics from this hospitalization (including imaging, microbiology, ancillary and laboratory) are listed below for reference.    Significant Diagnostic Studies: CT Head Wo Contrast  Result Date: 11/01/2020 CLINICAL DATA:  Headache over the last week EXAM: CT HEAD WITHOUT CONTRAST TECHNIQUE: Contiguous axial images were obtained from the base of the skull through the vertex without intravenous contrast. COMPARISON:  06/01/2013  FINDINGS: Brain: The brainstem and cerebellum are normal. Are there is an approximately 2 cm region of newly seen low-attenuation in the right frontal white matter. This was not visible in 2014. Question other subtle areas of white matter low-density in both hemispheres. No evidence of mass lesion or mass effect, hemorrhage, hydrocephalus or extra-axial collection. Vascular: No abnormal vascular finding. Skull: Normal Sinuses/Orbits: Clear/normal Other: None IMPRESSION: 2 cm region of newly seen low-attenuation in the right frontal white matter. This was not visible in 2014. Question other subtle areas of white matter low-density in both hemispheres. These findings are nonspecific, but could relate to chronic small-vessel ischemic change. The possibility of demyelinating disease is also possible. White matter mass lesion not completely excluded. MRI of the brain with and without contrast would be suggested for further evaluation. Electronically Signed   By: Paulina Fusi M.D.   On: 11/01/2020 13:03   DG Chest Port 1 View  Result Date: 11/01/2020 CLINICAL DATA:  Cough with confusion EXAM: PORTABLE CHEST 1 VIEW COMPARISON:  January 12, 2017 FINDINGS: Lungs are clear. The heart size and pulmonary vascularity are normal. No adenopathy. No bone lesions. IMPRESSION: Lungs clear.  Cardiac silhouette normal. Electronically Signed   By: Bretta Bang III M.D.   On: 11/01/2020 13:49   DG Foot Complete Left  Result Date: 10/08/2020 CLINICAL DATA:  Diabetic foot ulcer EXAM: LEFT FOOT - COMPLETE 3+ VIEW COMPARISON:  None. FINDINGS: Diffuse soft tissue swelling. Previous partial amputation is a of the first and third digits. Bony destructive change involving the head of the first metatarsal. Bony fragments at the location of the third proximal phalanx base. Irregular lucency involving distal shaft and head of fifth metatarsal. Probable pathologic fracture through the neck of the fifth metatarsal. Small erosion lateral  base of the second proximal phalanx. Radiopaque material or soft tissue calcification adjacent to the fifth metatarsal. Small plantar calcaneal spur. IMPRESSION: 1. Previous first and third digit amputation. Bony destructive changes at the head of the first metatarsal, in the region of base of third proximal phalanx, and involving the head and neck of the fifth metatarsal, concerning for osteomyelitis. Suspected small erosion at the lateral base of second proximal phalanx also suspicious for osteomyelitis. 2. Suspected pathologic fracture involving the neck of the fifth metatarsal. These results will be called to the ordering clinician or representative by the Radiologist Assistant, and communication documented in the PACS or [employer]. Electronically Signed   By: Jasmine Pang M.D.   On: 10/08/2020 23:00     Microbiology: Recent  Results (from the past 240 hour(s))  Urine culture     Status: None   Collection Time: 11/01/20 12:13 PM   Specimen: In/Out Cath Urine  Result Value Ref Range Status   Specimen Description   Final    IN/OUT CATH URINE Performed at [facility], [address] [place], [place] [address]    Special Requests   Final    NONE Performed at [facility], [address] [place], [place] [address]    Culture   Final    NO GROWTH Performed at [facility], [address] [place], [place] [address]    Report Status 11/03/2020 FINAL  Final  Resp Panel by RT-PCR (Flu A&B, Covid) Nasopharyngeal Swab     Status: Abnormal   Collection Time: 11/01/20 12:15 PM   Specimen: Nasopharyngeal Swab; Nasopharyngeal(NP) swabs in vial transport medium  Result Value Ref Range Status   SARS Coronavirus 2 by RT PCR POSITIVE (A) NEGATIVE Final    Comment: RESULT CALLED TO, READ BACK BY AND VERIFIED WITH: DR. ZAMMIT @1437  11/01/2020 KAY (NOTE) SARS-CoV-2 target nucleic acids are DETECTED.  The SARS-CoV-2 RNA is generally detectable in upper respiratory specimens during the  acute phase of infection. Positive results are indicative of the presence of the identified virus, but do not rule out bacterial infection or co-infection with other pathogens not detected by the test. Clinical correlation with patient history and other diagnostic information is necessary to determine patient infection status. The expected result is Negative.  Fact Sheet for Patients: 12/30/2020  Fact Sheet for Healthcare Providers: BloggerCourse.com  This test is not yet approved or cleared by the [employer] FDA and  has been authorized for detection and/or diagnosis of SARS-CoV-2 by FDA under an Emergency Use Authorization (EUA).  This EUA will remain in effect (meaning this test can be u sed) for the duration of  the COVID-19 declaration under Section 564(b)(1) of the Act, 21 U.S.C. section 360bbb-3(b)(1), unless the authorization is terminated or revoked sooner.     Influenza A by PCR NEGATIVE NEGATIVE Final   Influenza B by PCR NEGATIVE NEGATIVE Final    Comment: (NOTE) The Xpert Xpress SARS-CoV-2/FLU/RSV plus assay is intended as an aid in the diagnosis of influenza from Nasopharyngeal swab specimens and should not be used as a sole basis for treatment. Nasal washings and aspirates are unacceptable for Xpert Xpress SARS-CoV-2/FLU/RSV testing.  Fact Sheet for Patients: [place]  Fact Sheet for Healthcare Providers: BloggerCourse.com  This test is not yet approved or cleared by the [employer] FDA and has been authorized for detection and/or diagnosis of SARS-CoV-2 by FDA under an Emergency Use Authorization (EUA). This EUA will remain in effect (meaning this test can be used) for the duration of the COVID-19 declaration under Section 564(b)(1) of the Act, 21 U.S.C. section 360bbb-3(b)(1), unless the authorization is terminated or revoked.  Performed at  [facility], [address] [place], [place]   Blood Culture (routine x 2)     Status: None (Preliminary result)   Collection Time: 11/01/20 12:28 PM   Specimen: BLOOD  Result Value Ref Range Status   Specimen Description BLOOD BLOOD RIGHT WRIST  Final   Special Requests   Final    Blood Culture adequate volume BOTTLES DRAWN AEROBIC AND ANAEROBIC   Culture   Final    NO GROWTH 2 DAYS Performed at [facility], [address] [place], [place]    Report Status PENDING  Incomplete  Blood Culture (routine x 2)     Status: None (Preliminary result)   Collection Time: 11/01/20 12:42 PM   Specimen: BLOOD  Result Value Ref Range Status   Specimen Description BLOOD BLOOD LEFT HAND  Final   Special Requests   Final    Blood Culture results may not be optimal due to an inadequate volume of blood received in culture bottles BOTTLES DRAWN AEROBIC AND ANAEROBIC   Culture   Final    NO GROWTH 2 DAYS Performed at [facility], [address] [place], [place] [address]    Report Status PENDING  Incomplete     Labs: Basic Metabolic Panel: Recent Labs  Lab 11/01/20 1152 11/02/20 0528 11/03/20 0653  NA 138 144 146*  K 3.3* 3.6 2.8*  CL 106 115* 114*  CO2 22 21* 24  GLUCOSE 110* 120* 69*  BUN 22* 18 14  CREATININE 1.28* 0.85 0.73  CALCIUM 8.7* 7.8* 8.1*  MG 1.9  --   --    Liver Function Tests: Recent Labs  Lab 11/01/20 1152 11/02/20 0528  AST 61* 38  ALT 34 29  ALKPHOS 102 103  BILITOT 0.6 0.6  PROT 5.8* 5.1*  ALBUMIN 2.5* 2.3*   No results for input(s): LIPASE, AMYLASE in the last 168 hours. No results for input(s): AMMONIA in the last 168 hours. CBC: Recent Labs  Lab 11/01/20 1152 11/02/20 0528 11/03/20 0653  WBC 8.2 4.5 6.7  NEUTROABS 5.6  --   --   HGB 12.2 11.3* 10.7*  HCT 42.0 39.1 36.3  MCV 87.3 87.5 85.2  PLT 368 334 321   Cardiac Enzymes: No results for input(s): CKTOTAL, CKMB, CKMBINDEX, TROPONINI in the last 168  hours. BNP: Invalid input(s): POCBNP CBG: Recent Labs  Lab 11/02/20 1722 11/02/20 2145 11/03/20 0737 11/03/20 0802 11/03/20 1106  GLUCAP 128* 142* 53* 71 137*    Time coordinating discharge:  36 minutes  Signed:  Catarina Hartshorn, DO [facility] Pager: [phone] 11/03/2020, 11:48 AM

## 2020-11-03 NOTE — Progress Notes (Signed)
Hypoglycemic Event  CBG:  53  Treatment: 4 oz juice   Symptoms: none   Follow-up CBG: 15 minutes later CBG- 73  TimePossible Reasons for Event: Fasting glucose   Comments/MD notified Tat, Onalee Hua, MD  notified     Demetrio Lapping

## 2020-11-03 NOTE — Progress Notes (Signed)
IV removed clan dry and intact. discharge instructions explained, patient expressed understanding.pt and belongings taken to main entrance.

## 2020-11-05 ENCOUNTER — Telehealth: Payer: Self-pay

## 2020-11-05 NOTE — Telephone Encounter (Signed)
Patient's husband called stating he has not heard anything from [facility] in regards to PT for the patient. I have reached out to [facility] and patient is scheduled for start of care tomorrow 11/06/20.  Frances Furbish will be reaching out to the husband today to let him know.  I have spoke to the husband as well to advise him that he will be receiving a call from [place].  Tilly Pernice T Pricilla Loveless

## 2020-11-06 LAB — CULTURE, BLOOD (ROUTINE X 2)
Culture: NO GROWTH
Culture: NO GROWTH
Special Requests: ADEQUATE

## 2020-11-10 ENCOUNTER — Ambulatory Visit (INDEPENDENT_AMBULATORY_CARE_PROVIDER_SITE_OTHER): Payer: BC Managed Care – PPO | Admitting: Internal Medicine

## 2020-11-10 ENCOUNTER — Encounter: Payer: Self-pay | Admitting: Internal Medicine

## 2020-11-10 ENCOUNTER — Other Ambulatory Visit: Payer: Self-pay

## 2020-11-10 VITALS — BP 94/74 | HR 102 | Temp 98.9°F

## 2020-11-10 DIAGNOSIS — M313 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: Secondary | ICD-10-CM | POA: Diagnosis not present

## 2020-11-10 DIAGNOSIS — M869 Osteomyelitis, unspecified: Secondary | ICD-10-CM | POA: Diagnosis not present

## 2020-11-10 DIAGNOSIS — U071 COVID-19: Secondary | ICD-10-CM | POA: Diagnosis not present

## 2020-11-10 DIAGNOSIS — D849 Immunodeficiency, unspecified: Secondary | ICD-10-CM | POA: Diagnosis not present

## 2020-11-10 NOTE — Patient Instructions (Addendum)
Your left foot wound looks very good, now a week off antibiotics. The incision is not fully healed. Please stay off your foot as much as possible while it is healing  If fever, chill, pain, swelling, redness, pus please give our clinic a call  Will get a set of baseline labs today. Follow up with your podiatrist. No need to follow up with me unless the incision gets infected.   I agree with your rheumatologist about delaying the immunosuppression until at least 2 weeks from now   Please go to [facility] to get robitussin with codeine for your cough. I can't put in prescription through my computer

## 2020-11-10 NOTE — Progress Notes (Signed)
[facility]  Patient Active Problem List   Diagnosis Date Noted  . COVID-19 virus infection 11/01/2020  . Diarrhea 11/01/2020  . Dehydration 11/01/2020  . Generalized abdominal pain 10/09/2018  . Midline low back pain 09/11/2018  . Malfunction of intrathecal infusion pump 09/11/2018  . 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 Steroid-induced hyperglycemia 05/11/2018  . Weakness 05/02/2018  . Morbid obesity (HCC) 01/25/2018  . Neuropathy 01/25/2018  . Presence of intrathecal pump 08/02/2017  . Rectocele 03/09/2017  . Urinary hesitancy 03/09/2017  . Abnormal finding on imaging 12/19/2016  . Esophageal dysphagia 12/19/2016  . Current chronic use of systemic steroids 10/26/2016  . IFG (impaired fasting glucose) 10/26/2016  . Arthralgia of multiple joints 07/15/2016  . Headache 07/15/2016  . Maculopapular rash, generalized 07/15/2016  . Peripheral neuropathy 07/04/2016  . Rash 07/04/2016  . CKD (chronic kidney disease) stage 3, GFR 30-59 ml/min (HCC) 07/04/2016  . 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 Neuropathic pain of both feet 01/11/2016  . OSA (obstructive sleep apnea) 12/29/2015  . Chronic insomnia 12/29/2015  . Mixed hyperlipidemia 12/29/2015  . Obesity, Class II, BMI 35-39.9, with comorbidity 12/29/2015  . Polyneuropathy associated with underlying disease (HCC) 12/29/2015  . 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) 12/29/2015  . Class 2 drug-induced obesity with serious comorbidity and body mass index (BMI) of 35.0 to 35.9 in adult 12/29/2015  . Other mechanical complication of implanted electronic neurostimulator of spinal cord electrode (lead), initial encounter (HCC) 08/04/2015  . Adjustment disorder with depressed mood 03/29/2015  . Hyperlipidemia 07/20/2014  . Mononeuritis 07/20/2014  . Obesity (BMI 30-39.9) 07/20/2014  . Essential hypertension 07/20/2014  . Early congenital  syphilis with symptoms 07/20/2014  . Neurocardiogenic syncope 07/06/2014  . Chronic anemia 06/08/2014  . POTS (postural orthostatic tachycardia syndrome) 06/08/2014  . Tachycardia 06/08/2014  . Hypotension 06/03/2014  . Anal fissure 09/16/2013  . Adrenal insufficiency (HCC) 05/30/2013  . Encounter for long-term (current) use of high-risk medication 05/30/2013  . Fall 05/30/2013  . Hemorrhage of rectum and anus 05/30/2013  . Hypokalemia 05/30/2013  . Leukocytosis 05/30/2013  . Other long term (current) drug therapy 05/30/2013  . Syncope and collapse 05/30/2013  . 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/30/2013  . 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/2013  . Wegner's disease (congenital syphilitic osteochondritis)   . Gastroesophageal reflux disease without esophagitis 03/07/2012  . Other diseases of trachea and bronchus 08/30/2011  . 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 Atrophy of nasal turbinates 07/25/2011  . Nasal septal perforation 07/25/2011  . Cough 03/24/2011  . 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  . 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 Sleep apnea 03/24/2011      Subjective:    Patient ID: [name], female    DOB: Jul 13, 1966, 55 y.o.   MRN: [id number]  No chief complaint on file.   HPI:  [name] is a 55 y.o. 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 I inbox messaged him via epic and he relates that he had performed the transection rather proximally and believed he had good margin on the left foot tma I had asked for hh to give her dalbavancin prior to that but unable to. She was given rx of  cipro and was taking 1 prior to week prior surgery. Ultimately she received linezolid started on 1/04. She  started reporting gi sx and diarrhea along with uri sx and was admitted 1/10-1/09/2021 dx'ed with mild covid infection along with dehydration. She wasn't getting tested for cdiff  I also spoke with her rheumatologist who wants to give her a dose of rituximab soon for her RA. She has a lot of shoulders/elbows, but not so much wrists/ankle pain  She still have cough from covid and asking for antitussive with codeine  Diarrhea is about 4-5 times a day. Not worse  Background: --------------------  Hx via patient and review of epic including care everywhere  From dr Talbert CageMuzaffar of [facility] rheumatology on 10/12/20 Patient has wegner/anca-associated vasculitis Nonsmoker Taking 20 mg prednisone chronically along with methotrexate and rituximab She is on bactrim ds tiw prophylaxis Xray left foot 12/17 showed bony destructie changes head of 1st mt, base of 3rd prox phalanx, head/neck of 5th mt concerning for OM. Suspected fx left 5th mt. Plan on holding rituximab given concerning changes of left foot"  She also has recurrent ulcers in bilateral LE, upper back, lower abd area. 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 She endorsed that the toes would get blister/red and then became black/dry and start gradually falling off  She denies hx raynauds; nonsmoker. No history per rheum note of CREST syndrome  She presented today for imaging suggestion of left foot om. But had an ulcer for several weeks left lateral foot not improving with wound care.  She sees dr Leanord Hawkingobson of wound care and podiatry as well  Podiatry is planning on tma (per her report) of the left foot on 12/30  She has a recent bedside left foot chronic wound tissue/bone cx 12/17 that grew pseudomonas and mrsa. She was sent to [facility] clinic for medication management  She denies fever, chill She has some increased  pain/swelling in the chronic wound on the left foot She is not taking other abx except bactrim prophy    Allergies  Allergen Reactions  . Ibuprofen Other (See Comments)    Pt is unable to take this due to kidney problems.   Other reaction(s): Other Not to take with other medications   . Penicillins Rash and Other (See Comments)    Has patient had a PCN reaction causing immediate rash, facial/tongue/throat swelling, SOB or lightheadedness with hypotension: No Has patient had a PCN reaction causing severe rash involving mucus membranes or skin necrosis: No Has patient had a PCN reaction that required hospitalization No Has patient had a PCN reaction occurring within the last 10 years: No If all of the above answers are "NO", then may proceed with Cephalosporin use.      Outpatient Medications Prior to Visit  Medication Sig Dispense Refill  . acetaminophen (TYLENOL) 325 MG tablet Take 2 tablets (650 mg total) by mouth every 6 (six) hours as needed for mild pain (or Fever >/= 101).    Marland Kitchen. albuterol (PROVENTIL HFA;VENTOLIN HFA) 108 (90 Base) MCG/ACT inhaler Inhale 2 puffs into the lungs every 6 (six) hours as needed for wheezing or shortness of breath.    Marland Kitchen. b complex vitamins tablet Take 1 tablet by mouth daily.    . Calcium Carb-Cholecalciferol (CALCIUM CARBONATE-VITAMIN D3 PO) Take 1 tablet by mouth daily.    . DULoxetine (CYMBALTA) 60 MG  capsule Take 60 mg by mouth 2 (two) times daily.    . famotidine (PEPCID) 20 MG tablet Take 20 mg daily    . ferrous sulfate 325 (65 FE) MG tablet Take 325 mg by mouth daily with breakfast.    . fluticasone (FLONASE) 50 MCG/ACT nasal spray Place 2 sprays into both nostrils daily as needed for rhinitis.    . folic acid (FOLVITE) 1 MG tablet Take 2 mg by mouth daily.    . furosemide (LASIX) 40 MG tablet Take 1 tablet (40 mg total) by mouth as directed. 90 tablet 3  . gabapentin (NEURONTIN) 300 MG capsule Take 600-900 mg by mouth See admin instructions.  Take 3 capsules (900 mg total) by mouth in the morning, 2 capsules (600 mg total) in the early afternoon, and 4 capsules (1200 mg total) in the evening.    . lidocaine (LIDODERM) 5 % Place 1 patch onto the skin daily as needed (for pain). Remove & Discard patch within 12 hours or as directed by MD    . magnesium oxide (MAG-OX) 400 MG tablet Take 400 mg by mouth 2 (two) times daily.    . metFORMIN (GLUCOPHAGE) 500 MG tablet Take 500 mg by mouth 2 (two) times daily with a meal.    . methotrexate 50 MG/2ML injection Inject 1 mL into the skin once a week.     . montelukast (SINGULAIR) 10 MG tablet Take 10 mg by mouth daily.  3  . Potassium Chloride Crys ER (K-DUR PO) Take 40 mEq by mouth 3 (three) times daily.    . tapentadol (NUCYNTA) 50 MG tablet Take 50 mg by mouth 2 (two) times daily.    . tizanidine (ZANAFLEX) 2 MG capsule Take 2 mg by mouth 3 (three) times daily as needed for muscle spasms.    Marland Kitchen topiramate (TOPAMAX) 100 MG tablet Take 100 mg by mouth daily.    . traZODone (DESYREL) 100 MG tablet Take 1 tablet (100 mg total) by mouth at bedtime.    . vitamin B-12 (CYANOCOBALAMIN) 1000 MCG tablet Take 1,000 mcg by mouth daily.    . Vitamin D, Ergocalciferol, (DRISDOL) 50000 units CAPS capsule Take 50,000 Units by mouth every 7 (seven) days. Pt takes on Friday.    Marland Kitchen ZICONOTIDE ACETATE IT by Intrathecal route every 3 (three) months. Unknown dose     No facility-administered medications prior to visit.     Past Medical History:  Diagnosis Date  . Chronic cough   . Chronic pain   . Diabetes mellitus without complication (HCC)   . Dyspnea   . GERD (gastroesophageal reflux disease)   . Hypokalemia   . Hypothyroidism   . Neurocardiogenic syncope   . OSA (obstructive sleep apnea)    does not use CPAP  . Osteomyelitis (HCC)    bilateral feet  . Peripheral neuropathy   . Presence of intrathecal pump    recieves Prialt/bupivicaine  . Wegener's granulomatosis 2009  . Wegner's disease  (congenital syphilitic osteochondritis)       Past Surgical History:  Procedure Laterality Date  . AMPUTATION Right 10/21/2020   Procedure: Right hallux amputation;  Surgeon: Toni Arthurs, MD;  Location: [facility];  Service: Orthopedics;  Laterality: Right;  . AMPUTATION TOE Bilateral 08/12/2020   Procedure: Right 3rd toe amputation; left hallux and 3rd toe amputation;  Surgeon: Toni Arthurs, MD;  Location: [facility];  Service: Orthopedics;  Laterality: Bilateral;   . APPENDECTOMY    .  BACK SURGERY    . CHOLECYSTECTOMY    . COLONOSCOPY N/A 06/02/2013   Procedure: COLONOSCOPY;  Surgeon: Graylin Shiver, MD;  Location: [facility];  Service: Endoscopy;  Laterality: N/A;  . HERNIA REPAIR  2000  . INCISION AND DRAINAGE ABSCESS Left 07/07/2016   Procedure: DEBRIDMENT LEFT THIGH ABSCESS, EXISION ACUTE SKIN RASH LEFT THIGH(1CM LESION);  Surgeon: Claud Kelp, MD;  Location: [facility];  Service: General;  Laterality: Left;  . SEPTOPLASTY  02/2013  . spleen anuyism    . splenic aneurysm    . TRANSMETATARSAL AMPUTATION Bilateral 10/21/2020   Procedure: Left transmetatarsal amputation;  Surgeon: Toni Arthurs, MD;  Location: [facility];  Service: Orthopedics;  Laterality: Bilateral;  . VAGINAL HYSTERECTOMY        Family History  Problem Relation Age of Onset  . Diabetes Mother   . Heart disease Mother   . Diabetes Father       Social History   Socioeconomic History  . Marital status: Married    Spouse name: Not on file  . Number of children: Not on file  . Years of education: Not on file  . Highest education level: Not on file  Occupational History  . Not on file  Tobacco Use  . Smoking status: Never Smoker  . Smokeless tobacco: Never Used  Vaping Use  . Vaping Use: Never used  Substance and Sexual Activity  . Alcohol use: Yes    Comment: occasionally  . Drug use: No  . Sexual activity: Yes    Birth control/protection:  Surgical  Other Topics Concern  . Not on file  Social History Narrative  . Not on file   Social Determinants of Health   Financial Resource Strain: Not on file  Food Insecurity: Not on file  Transportation Needs: Not on file  Physical Activity: Not on file  Stress: Not on file  Social Connections: Not on file  Intimate Partner Violence: Not on file      Review of Systems   other ros negative  Objective:    There were no vitals taken for this visit. Nursing note and vital signs reviewed.  Physical Exam Here in wheelchair, no distress, conversant Heent: moon facie, normocephalic otherwise, per, conj clear, eomi Neck supple cv rrr no mrg Lungs clear; normal respiratory effort abd s/nt Ext no edema msk s/p left foot tma; the incision is mostly intact, there is a lateral part of about 1 inch that still haven't closed; there is no purulence/eschar or swelling or erythema or tenderness Psych alert/oriented        Labs: 11/03/20 cr 0.7; cbc 7/11/321  Crp: 01/10 4.8 12/23  54   Micro: 12/17 left foot wound cx mrsa (R bactrim/tetra; S clinda), pseudomonas aeruginosa (pan sensitive)  Serology: 08/2020 quant gold negative, hep c negative; hep b sAg/cAb negative  Imaging:    Assessment & Plan:     Problem List Items Addressed This Visit      Cardiovascular and Mediastinum   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)   Relevant Orders   Comprehensive metabolic panel   C-reactive protein   CBC    Other Visit Diagnoses    Immunosuppressed status (HCC)    -  Primary   Relevant Orders   Comprehensive metabolic panel   C-reactive protein   CBC   Osteomyelitis, unspecified site, unspecified type (HCC)       Relevant Orders   Comprehensive metabolic panel   C-reactive protein  CBC   COVID-19       Relevant Orders   Comprehensive metabolic panel   C-reactive protein   CBC      55 yo female vasculitis chronic on immunosuppression  here for imaging om changes left foot in several areas along with chronic open sore left lateral foot that is confirmed on bone/tissue cx for om with presence of mrsa/pseudomonas  She is a nonsmoker and doesn't have hx of crest syndrome per rheumatology  She also has recurrent ulcer/sores of her legs/upper back/lower abdomen in the distribution of hands reach; I query if pathogenic scratching/self traumatizing. She mentioned she was told they could be pyoderma gangrenosum but the appearance and hx don't suggest so. I advised husband to notice if she scratches frequently and consider wearing mitts at night when she sleep  With regard to the xray om changes, the culture of tissue/bone does suggest she could have om. On exam today 12/23 there is some blistering surrounding the left foot lateral open wound; her crp/wbc rather high. Although lack of fever (on immunosuppression), I would start her on abx now awaiting tma  As tma is a week from now, I do not want to place a picc. It would be reasonable based on the microbiology to give her a dose of dalbavancin 1500 mg and oral cipro until the tma, at which time if there is no deep infection on surgery can stop abx  -------------- 11/10/20 id f/u assessment Doing very well from OM/wound standpoint since tma left foot and right foot hallux amputation at prox phalanx on 12/30. She is off oral abx on 1/10. So far mainly sx related to covid infection but that is improving. Her wound exam today shows no evidence ongoing infection. crp on 1/10 is also normal, much improved than just prior to surgery   -continue off abx -f/u as needed if swelling, fever, chill, redness/pain, dehisced wound -agree with holding rituximab for another 2 weeks   I spend 25 minutes reviewing case, recent admissions records, and >50% of the time counseling/educating/discussing treatment plan with patient  Follow-up: No follow-ups on file.      Raymondo Band, MD [facility] -- -- pager   [phone] cell 11/10/2020, 1:52 PM

## 2020-11-11 LAB — C-REACTIVE PROTEIN: CRP: 6.5 mg/L (ref <8.0)

## 2020-11-24 ENCOUNTER — Other Ambulatory Visit: Payer: Self-pay

## 2020-11-24 ENCOUNTER — Encounter (HOSPITAL_BASED_OUTPATIENT_CLINIC_OR_DEPARTMENT_OTHER): Payer: BC Managed Care – PPO | Attending: Physician Assistant | Admitting: Physician Assistant

## 2020-11-24 DIAGNOSIS — L97122 Non-pressure chronic ulcer of left 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: Secondary | ICD-10-CM | POA: Diagnosis not present

## 2020-11-24 DIAGNOSIS — Z7984 Long term (current) use of oral hypoglycemic drugs: Secondary | ICD-10-CM | POA: Insufficient documentation

## 2020-11-24 DIAGNOSIS — M3131 Wegener's granulomatosis with renal involvement: Secondary | ICD-10-CM | POA: Diagnosis not present

## 2020-11-24 DIAGNOSIS — L97812 Non-pressure chronic ulcer of other part of right lower leg with fat layer exposed: Secondary | ICD-10-CM | POA: Diagnosis not present

## 2020-11-24 DIAGNOSIS — E1140 Type 2 diabetes mellitus with diabetic neuropathy, unspecified: Secondary | ICD-10-CM | POA: Diagnosis not present

## 2020-11-24 DIAGNOSIS — L97822 Non-pressure chronic ulcer of other part of left lower leg with fat layer exposed: Secondary | ICD-10-CM | POA: Insufficient documentation

## 2020-11-24 DIAGNOSIS — E1151 Type 2 diabetes mellitus with diabetic peripheral angiopathy without gangrene: Secondary | ICD-10-CM | POA: Diagnosis not present

## 2020-11-24 DIAGNOSIS — L97512 Non-pressure chronic ulcer of other part of right foot with fat layer exposed: Secondary | ICD-10-CM | POA: Diagnosis not present

## 2020-11-24 DIAGNOSIS — E11621 Type 2 diabetes mellitus with foot ulcer: Secondary | ICD-10-CM | POA: Insufficient documentation

## 2020-11-24 DIAGNOSIS — E1122 Type 2 diabetes mellitus with diabetic chronic kidney disease: Secondary | ICD-10-CM | POA: Insufficient documentation

## 2020-11-24 DIAGNOSIS — N189 Chronic kidney disease, unspecified: Secondary | ICD-10-CM | POA: Insufficient documentation

## 2020-11-24 NOTE — Progress Notes (Addendum)
PERLE, BRICKHOUSE ([id number]) Visit Report for 11/24/2020 Chief Complaint Document Details Patient Name: Date of Service: [name], [name] 11/24/2020 10:15 A M Medical Record Number: [id number] Patient Account Number: [id number] Date of Birth/Sex: Treating RN: 10/28/1965 (55 y.o. [name] Primary Care Provider: Elizabeth Palau Other Clinician: Referring Provider: Treating Provider/Extender: Duanne Limerick in Treatment: 10 Information Obtained from: Patient Chief Complaint Bilateral LE Ulcers Electronic Signature(s) Signed: 11/24/2020 9:57:43 AM By: Lenda Kelp PA-C Entered By: Lenda Kelp on 11/24/2020 09:57:43 -------------------------------------------------------------------------------- HPI Details Patient Name: Date of Service: [name], [name] 11/24/2020 10:15 A M Medical Record Number: [id number] Patient Account Number: [id number] Date of Birth/Sex: Treating RN: 1966/05/31 (55 y.o. [name] Primary Care Provider: Elizabeth Palau Other Clinician: Referring Provider: Treating Provider/Extender: Duanne Limerick in Treatment: 10 History of Present Illness HPI Description: 09/15/2020 upon evaluation today patient appears to be doing somewhat poorly in regard to the wounds on her feet. She has a wound on the right lateral foot, right medial great toe, right dorsal great toe, left lateral foot, and right lateral lower leg. Currently based on what we are seeing the patient states that the worst issue is her right great toe. She dates that she wishes Dr. Victorino Dike had actually gone ahead and performed amputation at this site as well when he did the other toes they have all healed well that is the right third toe, left great toe, and left third toe. All are completely healed and did excellent. Currently she has been transition into Cam boots for both feet previously she was using postop shoes but apparently this caused some rubbing  on the lateral aspects of both feet and at the medial aspect of her right great toe which unfortunately has caused her a lot of discomfort and issues. She has been given mupirocin and betamethasone for some of the small wounds that occur over her legs and arms frequently. 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 Otherwise she also does have diabetes mellitus type 2. Currently the patient is on methotrexate and also undergoes chemotherapy infusions every 6 months for the Wegener's granulomatosis currently her amputations were asked on August 12, 2020 that was with Dr. Victorino Dike. His physician assistant Jill Alexanders referred her to me today. 09/22/2020 upon evaluation today patient appears to be making good progress with the Santyl at this point. I am extremely pleased with where things stand today and I do believe him to be able to debride some of the left lateral foot wound which we have not been able to do as the eschar was too thick. I think this is doing much better currently. 09/29/2020 upon evaluation today patient appears to be doing decently well in regard to her ulcers on her feet. I do believe the Santyl has been of benefit. With that being said I am concerned about the fact that she continues to have wounds over her arms and legs bilaterally which seem to continually be an issue. Fortunately there is no signs of active infection at this time but again I am not really sure where these are coming from. She is seeing dermatology they had suggested this was pyoderma but that seems kind of unusual based on how they seem to come up. She has been using a mix of steroid and mupirocin over these areas. 12/17; this is a patient called in  urgently to be seen. She was apparently in the shower washing the wound on the left lateral fifth metatarsal head and noticed a hole present. She also  complained of increasing pain.No systemic signs or symptoms. 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 She says her wound started from friction with Cam boots that she had after amputation of three toes two on the left and one on the right 10/13/2020 upon evaluation today patient appears to be doing really about the same in regard to her feet. She is scheduled for amputation surgery on the 30th of this month December 2021. 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 With that being said she does seem somewhat a little down about everything going on obviously she was hopeful to try to get the wound is healed things just have not progressed as we were hoping. Fortunately there is no signs of active infection at this time. No fevers, chills, nausea, vomiting, or diarrhea. 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 11/24/2020 patient presents for reevaluation here in our clinic after having had surgery with Dr. Victorino Dike on 10/21/2020. 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 The patient also had percutaneous left Achilles tendon lengthening. Subsequently she has done fairly well with regard to these surgeries. The right great toe has healed nicely the left transmetatarsal amputation has mostly healed although there is a small area that looks like it may be attempting to dehisce. With that being said Dr. Victorino Dike still has the sutures in place I did not do anything with this wound today though the patient wanted me to look at it. I explained that I do think Dr. Victorino Dike needs to remove the sutures when he thinks that is appropriate and subsequently if we need to help with trying to get it healed if there is  a small dehiscence I will be more than happy to aid in that regard. All this was discussed with the patient during the office visit today. Electronic Signature(s) Signed: 11/24/2020 11:20:33 AM By: Lenda Kelp PA-C Entered By: Lenda Kelp on 11/24/2020 11:20:33 -------------------------------------------------------------------------------- Physical Exam Details Patient Name: Date of Service: [name], [name] 11/24/2020 10:15 A M Medical Record Number: [id number] Patient Account Number: [id number] Date of Birth/Sex: Treating RN: 1965-11-28 (55 y.o. [name] Primary Care Provider: Elizabeth Palau Other Clinician: Referring Provider: Treating Provider/Extender: Duanne Limerick in Treatment: 10 Constitutional Well-nourished and well-hydrated in no acute distress. Respiratory normal breathing without difficulty. Psychiatric this patient is able to make decisions and demonstrates good insight into disease process. Alert and Oriented x 3. pleasant and cooperative. Notes Upon inspection patient's wounds again all appear to be the small sores that come up and then go away almost as quickly as they arrived over the lower extremities bilaterally. She actually has an appointment with dermatology next week on the seventh in order to see what they think of this. Again this is a longstanding issue that has been quite a problem for her. We have been using a combination of mupirocin and clobetasol which does seem to help. Electronic Signature(s) Signed: 11/24/2020 11:21:03 AM By: Lenda Kelp PA-C Entered By: Lenda Kelp on 11/24/2020 11:21:03 --------------------------------------------------------------------------------  Physician Orders Details Patient Name: Date of Service: [name], [name] 11/24/2020 10:15 A M Medical Record Number: [id number] Patient Account Number: [id number] Date of Birth/Sex: Treating RN: [birth year] (55 y.o. 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 Bathing/ Shower/ Hygiene May shower and wash wound with soap and water. - shower with hibiclens soap weekly Off-Loading Removable cast walker boot to: - Cam boot left foot to ambulate per orthopedics Non Wound Condition pply the following to affected area as directed: - dry gauze secured with kerlix left transmet amputation site per orthopedics A Wound Treatment Wound #1 - Foot Wound Laterality: Right, Lateral Cleanser: Soap and Water 1 x Per Day/30 Days Discharge Instructions: May shower and wash wound with dial antibacterial soap and water prior to dressing change. Topical: Mupirocin Ointment 1 x Per Day/30 Days Discharge Instructions: Apply Mupirocin (Bactroban) as instructed Secondary Dressing: ComfortFoam Border, 2x2 in (silicone border) 1 x Per Day/30 Days Discharge Instructions: Apply over primary dressing or bandaid Wound #10 - Upper Leg Wound Laterality: Left, Lateral Cleanser: Soap and Water 1 x Per Day/30 Days Discharge Instructions: May shower and wash wound with dial antibacterial soap and water prior to dressing change. Topical: Mupirocin Ointment 1 x Per Day/30 Days Discharge Instructions: Apply Mupirocin (Bactroban)  as instructed Topical: Clobetasol Cream 1 x Per Day/30 Days Discharge Instructions: mixed with mupirocin Secondary Dressing: ComfortFoam Border, 2x2 in (silicone border) 1 x Per Day/30 Days Discharge Instructions: Apply over primary dressing as directed. Wound #11 - Lower Leg Wound Laterality: Right, Medial Cleanser: Soap and Water 1 x Per Day/30 Days Discharge Instructions: May shower and wash wound with dial antibacterial soap and water prior to dressing change. Topical: Mupirocin Ointment 1 x Per Day/30 Days Discharge Instructions: Apply Mupirocin (Bactroban) as instructed Topical: Clobetasol Cream 1 x Per Day/30 Days Discharge Instructions: mixed with mupirocin Secondary Dressing: ComfortFoam Border, 2x2 in (silicone border) 1 x Per Day/30 Days Discharge Instructions: Apply over primary dressing as directed. Wound #7 - Lower Leg Wound Laterality: Left, Proximal Cleanser: Soap and Water 1 x Per Day/30 Days Discharge Instructions: May shower and wash wound with dial antibacterial soap and water prior to dressing change. Topical: Mupirocin Ointment 1 x Per Day/30 Days Discharge Instructions: Apply Mupirocin (Bactroban) as instructed Topical: Clobetasol Cream 1 x Per Day/30 Days Discharge Instructions: mixed with mupirocin Secondary Dressing: ComfortFoam Border, 2x2 in (silicone border) 1 x Per Day/30 Days Discharge Instructions: Apply over primary dressing as directed. Patient Medications llergies: ibuprofen, penicillin A Notifications Medication Indication Start End 11/24/2020 clobetasol DOSE 0 - topical 0.025 % cream - cream topical mixed with mupirocin and applied to the open wounds on the legs until healed 11/24/2020 mupirocin DOSE topical 2 % ointment - ointment topical mixed with clobetasol and applied daily to open wounds on the legs as directed in clinic until healed Electronic Signature(s) Signed: 11/24/2020 11:13:52 AM  By: Lenda Kelp PA-C Entered By: Lenda Kelp on  11/24/2020 11:13:50 -------------------------------------------------------------------------------- Problem List Details Patient Name: Date of Service: [name], [name] 11/24/2020 10:15 A M Medical Record Number: [id number] Patient Account Number: [id number] Date of Birth/Sex: Treating RN: 22-Jul-1966 (55 y.o. Billy [place], Linda Primary Care Provider: Elizabeth Palau Other Clinician: Referring Provider: Treating Provider/Extender: Duanne Limerick in Treatment: 10 Active Problems ICD-10 Encounter Code Description Active Date MDM Diagnosis E11.621 Type 2 diabetes mellitus with foot ulcer 09/15/2020 No Yes L97.512 Non-pressure chronic ulcer of other part of right foot with fat layer exposed 09/15/2020 No Yes L97.812 Non-pressure chronic ulcer of other part of right lower leg with fat layer 09/15/2020 No Yes exposed L97.122 Non-pressure chronic ulcer of left 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 10/08/2020 No Yes L97.822 Non-pressure chronic ulcer of other part of left lower leg with fat layer exposed2/11/2020 No Yes M31.31 Wegener's granulomatosis with renal involvement 09/15/2020 No Yes N18.9 Chronic kidney disease, unspecified 09/15/2020 No Yes Inactive Problems Resolved Problems Electronic Signature(s) Signed: 11/24/2020 10:52:12 AM By: Lenda Kelp PA-C Previous Signature: 11/24/2020 9:57:32 AM Version By: Lenda Kelp PA-C Entered By: Lenda Kelp on 11/24/2020 10:52:11 -------------------------------------------------------------------------------- Progress Note Details Patient Name: Date of Service: [name], [name] 11/24/2020 10:15 A M Medical Record Number: [id number] Patient Account Number: [id number] Date of Birth/Sex: Treating RN: Oct 09, 1966 (55 y.o. [name] Primary Care Provider: Elizabeth Palau Other Clinician: Referring Provider: Treating Provider/Extender: Duanne Limerick in Treatment: 10 Subjective Chief  Complaint Information obtained from Patient Bilateral LE Ulcers History of Present Illness (HPI) 09/15/2020 upon evaluation today patient appears to be doing somewhat poorly in regard to the wounds on her feet. She has a wound on the right lateral foot, right medial great toe, right dorsal great toe, left lateral foot, and right lateral lower leg. Currently based on what we are seeing the patient states that the worst issue is her right great toe. She dates that she wishes Dr. Victorino Dike had actually gone ahead and performed amputation at this site as well when he did the other toes they have all healed well that is the right third toe, left great toe, and left third toe. All are completely healed and did excellent. Currently she has been transition into Cam boots for both feet previously she was using postop shoes but apparently this caused some rubbing on the lateral aspects of both feet and at the medial aspect of her right great toe which unfortunately has caused her a lot of discomfort and issues. She has been given mupirocin and betamethasone for some of the small wounds that occur over her legs and arms frequently. 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 Otherwise she also does have diabetes mellitus type 2. Currently the patient is on methotrexate and also undergoes chemotherapy infusions every 6 months for the Wegener's granulomatosis currently her amputations were asked on August 12, 2020 that was with Dr. Victorino Dike. His physician assistant Jill Alexanders referred her to me today. 09/22/2020 upon evaluation today patient appears to be making good progress with the Santyl at this point. I am extremely pleased with where things stand today and I do believe him to be able to debride some of the left lateral foot wound which we have not been able to do as the eschar was too thick. I  think this is  doing much better currently. 09/29/2020 upon evaluation today patient appears to be doing decently well in regard to her ulcers on her feet. I do believe the Santyl has been of benefit. With that being said I am concerned about the fact that she continues to have wounds over her arms and legs bilaterally which seem to continually be an issue. Fortunately there is no signs of active infection at this time but again I am not really sure where these are coming from. She is seeing dermatology they had suggested this was pyoderma but that seems kind of unusual based on how they seem to come up. She has been using a mix of steroid and mupirocin over these areas. 12/17; this is a patient called in urgently to be seen. She was apparently in the shower washing the wound on the left lateral fifth metatarsal head and noticed a hole present. She also complained of increasing pain.No systemic signs or symptoms. 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 She says her wound started from friction with Cam boots that she had after amputation of three toes two on the left and one on the right 10/13/2020 upon evaluation today patient appears to be doing really about the same in regard to her feet. She is scheduled for amputation surgery on the 30th of this month December 2021. 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 With that being said she does seem somewhat a little down about everything going on obviously she was hopeful to try to get the wound is healed things just have not progressed as we were hoping. Fortunately there is no signs of active infection at this time. No fevers, chills, nausea, vomiting, or diarrhea. She did have evidence of MRSA on culture but again right now I think that really something that should be handled by her infectious disease doctor who she will be  seeing tomorrow. 11/24/2020 patient presents for reevaluation here in our clinic after having had surgery with Dr. Victorino Dike on 10/21/2020. 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 The patient also had percutaneous left Achilles tendon lengthening. Subsequently she has done fairly well with regard to these surgeries. The right great toe has healed nicely the left transmetatarsal amputation has mostly healed although there is a small area that looks like it may be attempting to dehisce. With that being said Dr. Victorino Dike still has the sutures in place I did not do anything with this wound today though the patient wanted me to look at it. I explained that I do think Dr. Victorino Dike needs to remove the sutures when he thinks that is appropriate and subsequently if we need to help with trying to get it healed if there is a small dehiscence I will be more than happy to aid in that regard. All this was discussed with the patient during the office visit today. Objective Constitutional Well-nourished and well-hydrated in no acute distress. Vitals Time Taken: 10:11 AM, Height: 66 in, Weight: 195 lbs, BMI: 31.5, Temperature: 98.9 F, Pulse: 109 bpm, Respiratory Rate: 18 breaths/min, Blood Pressure: 92/68 mmHg. Respiratory normal breathing without difficulty. Psychiatric this patient is able to make decisions and demonstrates good insight into disease process. Alert and Oriented x 3. pleasant and cooperative. General Notes: Upon inspection patient's wounds again all appear to be the small sores that come up and then go  away almost as quickly as they arrived over the lower extremities bilaterally. She actually has an appointment with dermatology next week on the seventh in order to see what they think of this. Again this is a longstanding issue that has been quite a problem for her. We have been using a combination of mupirocin and clobetasol which does seem to  help. Integumentary (Hair, Skin) Wound #1 status is Open. Original cause of wound was Shear/Friction. The wound is located on the Right,Lateral Foot. The wound measures 0.3cm length x 0.3cm width x 0.1cm depth; 0.071cm^2 area and 0.007cm^3 volume. There is Fat Layer (Subcutaneous Tissue) exposed. There is no tunneling or undermining noted. There is a medium amount of serosanguineous drainage noted. The wound margin is flat and intact. There is large (67-100%) pink, pale granulation within the wound bed. There is no necrotic tissue within the wound bed. Wound #10 status is Open. Original cause of wound was Gradually Appeared. The wound is located on the Left,Lateral Upper Leg. The wound measures 1.2cm length x 0.4cm width x 0.1cm depth; 0.377cm^2 area and 0.038cm^3 volume. There is Fat Layer (Subcutaneous Tissue) exposed. There is no tunneling or undermining noted. There is a small amount of serosanguineous drainage noted. The wound margin is flat and intact. There is small (1-33%) pink granulation within the wound bed. There is a large (67-100%) amount of necrotic tissue within the wound bed including Adherent Slough. Wound #11 status is Open. Original cause of wound was Gradually Appeared. The wound is located on the Right,Medial Lower Leg. The wound measures 0.3cm length x 0.4cm width x 0.1cm depth; 0.094cm^2 area and 0.009cm^3 volume. There is Fat Layer (Subcutaneous Tissue) exposed. There is no tunneling or undermining noted. There is a small amount of serosanguineous drainage noted. The wound margin is flat and intact. There is medium (34-66%) pink granulation within the wound bed. There is a medium (34-66%) amount of necrotic tissue within the wound bed including Adherent Slough. Wound #3 status is Converted. Original cause of wound was Trauma. The wound is located on the Right T Great. The wound measures 0cm length x 0cm oe width x 0cm depth; 0cm^2 area and 0cm^3 volume. Wound #6 status is  Healed - Epithelialized. Original cause of wound was Gradually Appeared. The wound is located on the Left,Lateral Lower Leg. The wound measures 0cm length x 0cm width x 0cm depth; 0cm^2 area and 0cm^3 volume. There is no tunneling or undermining noted. There is a none present amount of drainage noted. There is no granulation within the wound bed. There is no necrotic tissue within the wound bed. Wound #7 status is Open. Original cause of wound was Gradually Appeared. The wound is located on the Left,Proximal Lower Leg. The wound measures 0.4cm length x 0.4cm width x 0.1cm depth; 0.126cm^2 area and 0.013cm^3 volume. There is Fat Layer (Subcutaneous Tissue) exposed. There is no tunneling or undermining noted. There is a medium amount of serosanguineous drainage noted. The wound margin is flat and intact. There is large (67-100%) red granulation within the wound bed. There is no necrotic tissue within the wound bed. Wound #9 status is Converted. Original cause of wound was Gradually Appeared. The wound is located on the Left T Fourth. The wound measures 0cm oe length x 0cm width x 0cm depth; 0cm^2 area and 0cm^3 volume. 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 Bathing/ Shower/ Hygiene: May shower and wash wound with soap and water. - shower with hibiclens soap weekly Off-Loading: Removable cast walker boot to: - Cam boot left foot to ambulate per orthopedics Non Wound Condition: Apply the following to affected area  as directed: - dry gauze secured with kerlix left transmet amputation site per orthopedics The following medication(s) was prescribed: clobetasol topical 0.025 % cream 0 cream topical mixed with mupirocin and applied to the open wounds on the legs until healed starting 11/24/2020 mupirocin topical 2 % ointment ointment topical mixed with clobetasol and applied daily to open wounds on the legs as directed in clinic until healed starting 11/24/2020 WOUND #1: - Foot Wound Laterality: Right, Lateral Cleanser: Soap and Water 1 x Per Day/30 Days Discharge Instructions: May shower and wash wound with dial antibacterial soap and water prior to dressing change. Topical: Mupirocin Ointment 1 x Per Day/30 Days Discharge Instructions: Apply Mupirocin (Bactroban) as instructed Secondary Dressing: ComfortFoam Border, 2x2 in (silicone border) 1 x Per Day/30 Days Discharge Instructions: Apply over primary dressing or bandaid WOUND #10: - Upper Leg Wound Laterality: Left, Lateral Cleanser: Soap and Water 1 x Per Day/30 Days Discharge Instructions: May shower and wash wound with dial antibacterial soap and water prior to dressing change. Topical: Mupirocin Ointment 1 x Per Day/30 Days Discharge Instructions: Apply Mupirocin (Bactroban) as instructed Topical: Clobetasol Cream 1 x Per Day/30 Days Discharge Instructions: mixed with mupirocin Secondary Dressing: ComfortFoam Border, 2x2 in (silicone border) 1 x Per Day/30 Days Discharge Instructions: Apply over primary dressing as directed. WOUND #11: - Lower Leg Wound Laterality: Right, Medial Cleanser: Soap and Water 1 x Per Day/30 Days Discharge Instructions: May shower and wash wound with dial antibacterial soap and water prior to dressing change. Topical: Mupirocin Ointment 1 x Per Day/30 Days Discharge Instructions: Apply Mupirocin (Bactroban) as instructed Topical: Clobetasol Cream 1 x Per Day/30 Days Discharge Instructions: mixed with mupirocin Secondary  Dressing: ComfortFoam Border, 2x2 in (silicone border) 1 x Per Day/30 Days Discharge Instructions: Apply over primary dressing as directed. WOUND #7: - Lower Leg Wound Laterality: Left, Proximal Cleanser: Soap and Water 1 x Per Day/30 Days Discharge Instructions: May shower and wash wound with dial antibacterial soap and water prior to dressing change. Topical: Mupirocin Ointment 1 x Per Day/30 Days Discharge Instructions: Apply Mupirocin (Bactroban) as instructed Topical: Clobetasol Cream 1 x Per Day/30 Days Discharge Instructions: mixed with mupirocin Secondary Dressing: ComfortFoam Border, 2x2 in (silicone border) 1 x Per Day/30 Days Discharge Instructions: Apply over primary dressing as directed. 1. Would recommend currently that she continue with the mupirocin and clobetasol to the open wounds of the lower extremities bilaterally. I think this is the best way to go. 2. In regard to the right foot I do think that the lateral foot area just a little bit of mupirocin is all she really needs in a Band-Aid here obviously I think this is almost healed. 3. 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 Victorino Dike. I will be more than happy to help if we need to help with getting the area to granulate in  after the sutures are removed if there is indeed a dehiscence. Nonetheless I explained that for now would recommend that she follow-up with Dr. Laverta Baltimore recommendations and follow-up with him in regard to call if she feels like that is necessary in order to see what he recommends at this point. Also in regard to antibiotic she inquired if there was anything else I recommended in that regard. With that being said I explained that that would be something I would defer to infectious disease. Right now as of the reviewed note that I did look over from 11/10/2020 the patient should not be on any antibiotics according to Dr.  Renold Don. If that needs to be changed I would recommend she contact infectious disease to discuss those options. We will see patient back for reevaluation in 3 weeks here in the clinic. If anything worsens or changes patient will contact our office for additional recommendations.. Electronic Signature(s) Signed: 11/24/2020 11:22:39 AM By: Lenda Kelp PA-C Entered By: Lenda Kelp on 11/24/2020 11:22:39 -------------------------------------------------------------------------------- SuperBill Details Patient Name: Date of Service: [name], [name] 11/24/2020 Medical Record Number: [id number] Patient Account Number: [id number] Date of Birth/Sex: Treating RN: 11-11-1965 (55 y.o. 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 chronic ulcer of left 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 L97.822 Non-pressure chronic ulcer of other part of left lower leg with fat layer exposed M31.31 Wegener's granulomatosis with renal involvement N18.9 Chronic kidney disease, unspecified Facility Procedures CPT4 Code: [id number] Description: [id number] - WOUND CARE VISIT-LEV 5 EST PT Modifier: Quantity: 1 Physician Procedures : CPT4 Code Description Modifier [id number] - WC PHYS LEVEL 4 - EST PT ICD-10 Diagnosis Description E11.621 Type 2 diabetes mellitus with foot ulcer L97.512 Non-pressure chronic ulcer of other part of right foot with fat layer exposed L97.812  Non-pressure chronic ulcer of other part of right lower leg with fat layer exposed L97.122 Non-pressure chronic ulcer of left thigh with fat  layer exposed Quantity: 1 Electronic Signature(s) Signed: 11/24/2020 11:22:55 AM By: Lenda Kelp PA-C Entered By: Lenda Kelp on 11/24/2020 11:22:55

## 2020-11-29 NOTE — Progress Notes (Signed)
ALIZON, SCHMELING ([id number]) Visit Report for 11/24/2020 Arrival Information Details Patient Name: Date of Service: [name], [name] 11/24/2020 10:15 A M Medical Record Number: [id number] Patient Account Number: [id number] Date of Birth/Sex: Treating RN: [birth year] (55 y.o. [name] Primary Care Cortney Beissel: Elizabeth Palau Other Clinician: Referring Miko Markwood: Treating Niyanna Asch/Extender: Duanne Limerick in Treatment: 10 Visit Information History Since Last Visit Added or deleted any medications: No Patient Arrived: Wheel Chair Any new allergies or adverse reactions: No Arrival Time: 10:10 Had a fall or experienced change in No Accompanied By: friend activities of daily living that may affect Transfer Assistance: None risk of falls: Patient Identification Verified: Yes Signs or symptoms of abuse/neglect since last visito No Secondary Verification Process Completed: Yes Hospitalized since last visit: No Patient Requires Transmission-Based Precautions: No Implantable device outside of the clinic excluding No Patient Has Alerts: No cellular tissue based products placed in the center since last visit: Has Dressing in Place as Prescribed: Yes Pain Present Now: Yes Electronic Signature(s) Signed: 11/29/2020 9:06:40 AM By: Karl Ito Entered By: Karl Ito on 11/24/2020 10:11:00 -------------------------------------------------------------------------------- Clinic Level of Care Assessment Details Patient Name: Date of Service: SEBASTIAN, DZIK 11/24/2020 10:15 A M Medical Record Number: [id number] Patient Account Number: [id number] Date of Birth/Sex: Treating RN: 10/22/1966 (55 y.o. [name] Primary Care Terrance Lanahan: Elizabeth Palau Other Clinician: Referring Alcus Bradly: Treating Anner Baity/Extender: Duanne Limerick in Treatment: 10 Clinic Level of Care Assessment Items TOOL 4 Quantity Score []  - 0 Use when only an EandM is  performed on FOLLOW-UP visit ASSESSMENTS - Nursing Assessment / Reassessment X- 1 10 Reassessment of Co-morbidities (includes updates in patient status) X- 1 5 Reassessment of Adherence to Treatment Plan ASSESSMENTS - Wound and Skin A ssessment / Reassessment []  - 0 Simple Wound Assessment / Reassessment - one wound X- 4 5 Complex Wound Assessment / Reassessment - multiple wounds []  - 0 Dermatologic / Skin Assessment (not related to wound area) ASSESSMENTS - Focused Assessment []  - 0 Circumferential Edema Measurements - multi extremities []  - 0 Nutritional Assessment / Counseling / Intervention X- 1 5 Lower Extremity Assessment (monofilament, tuning fork, pulses) []  - 0 Peripheral Arterial Disease Assessment (using hand held doppler) ASSESSMENTS - Ostomy and/or Continence Assessment and Care []  - 0 Incontinence Assessment and Management []  - 0 Ostomy Care Assessment and Management (repouching, etc.) PROCESS - Coordination of Care X - Simple Patient / Family Education for ongoing care 1 15 []  - 0 Complex (extensive) Patient / Family Education for ongoing care X- 1 10 Staff obtains , Records, T Results / Process Orders est []  - 0 Staff telephones HHA, Nursing Homes / Clarify orders / etc []  - 0 Routine Transfer to another Facility (non-emergent condition) []  - 0 Routine Hospital Admission (non-emergent condition) []  - 0 New Admissions / / Ordering NPWT Apligraf, etc. , []  - 0 Emergency Hospital Admission (emergent condition) X- 1 10 Simple Discharge Coordination []  - 0 Complex (extensive) Discharge Coordination PROCESS - Special Needs []  - 0 Pediatric / Minor Patient Management []  - 0 Isolation Patient Management []  - 0 Hearing / Language / Visual special needs []  - 0 Assessment of Community assistance (transportation, D/C planning, etc.) []  - 0 Additional assistance / Altered mentation []  - 0 Support Surface(s) Assessment  (bed, cushion, seat, etc.) INTERVENTIONS - Wound Cleansing / Measurement []  - 0 Simple Wound Cleansing - one wound X- 4 5 Complex Wound Cleansing - multiple wounds X- 1  5 Wound Imaging (photographs - any number of wounds) []  - 0 Wound Tracing (instead of photographs) []  - 0 Simple Wound Measurement - one wound X- 4 5 Complex Wound Measurement - multiple wounds INTERVENTIONS - Wound Dressings X - Small Wound Dressing one or multiple wounds 4 10 []  - 0 Medium Wound Dressing one or multiple wounds []  - 0 Large Wound Dressing one or multiple wounds X- 1 5 Application of Medications - topical []  - 0 Application of Medications - injection INTERVENTIONS - Miscellaneous []  - 0 External ear exam []  - 0 Specimen Collection (cultures, biopsies, blood, body fluids, etc.) []  - 0 Specimen(s) / Culture(s) sent or taken to Lab for analysis []  - 0 Patient Transfer (multiple staff / Nurse, adult / Similar devices) []  - 0 Simple Staple / Suture removal (25 or less) []  - 0 Complex Staple / Suture removal (26 or more) []  - 0 Hypo / Hyperglycemic Management (close monitor of Blood Glucose) []  - 0 Ankle / Brachial Index (ABI) - do not check if billed separately X- 1 5 Vital Signs Has the patient been seen at the hospital within the last three years: Yes Total Score: 170 Level Of Care: New/Established - Level 5 Electronic Signature(s) Signed: 11/24/2020 5:50:42 PM By: Zenaida Deed RN, BSN Entered By: Zenaida Deed on 11/24/2020 10:57:02 -------------------------------------------------------------------------------- Encounter Discharge Information Details Patient Name: Date of Service: RAGINA, GORDILLO 11/24/2020 10:15 A M Medical Record Number: [id number] Patient Account Number: [id number] Date of Birth/Sex: Treating RN: [birth year] (55 y.o. [name], [name] Primary Care Neetu Carrozza: Elizabeth Palau Other Clinician: Referring Mariellen Blaney: Treating Kishana Battey/Extender: Duanne Limerick in Treatment: 10 Encounter Discharge Information Items Discharge Condition: Stable Ambulatory Status: Wheelchair Discharge Destination: Home Transportation: Private Auto Accompanied By: self Schedule Follow-up Appointment: Yes Clinical Summary of Care: Patient Declined Electronic Signature(s) Signed: 11/29/2020 9:09:54 AM By: Fonnie Mu RN Entered By: Fonnie Mu on 11/24/2020 12:09:12 -------------------------------------------------------------------------------- Lower Extremity Assessment Details Patient Name: Date of Service: BRYANNAH, GOGUE 11/24/2020 10:15 A M Medical Record Number: [id number] Patient Account Number: [id number] Date of Birth/Sex: Treating RN: 10-29-1965 (55 y.o. [name] Primary Care Akhil Piscopo: Elizabeth Palau Other Clinician: Referring Jennifr Gaeta: Treating Randall Colden/Extender: Duanne Limerick in Treatment: 10 Edema Assessment Assessed: Kyra Searles: No] Franne Forts: No] Edema: [Left: No] [Right: No] Calf Left: Right: Point of Measurement: 30 cm From Medial Instep 32.5 cm 35 cm Ankle Left: Right: Point of Measurement: 9 cm From Medial Instep 21 cm 21 cm Vascular Assessment Pulses: Dorsalis Pedis Palpable: [Left:Yes] [Right:Yes] Electronic Signature(s) Signed: 11/24/2020 5:46:35 PM By: Zandra Abts RN, BSN Entered By: Zandra Abts on 11/24/2020 10:30:18 -------------------------------------------------------------------------------- Multi-Disciplinary Care Plan Details Patient Name: Date of Service: ALLIA, HIEGEL 11/24/2020 10:15 A M Medical Record Number: [id number] Patient Account Number: [id number] Date of Birth/Sex: Treating RN: August 10, 1966 (55 y.o. [name] Primary Care Arrington Yohe: Elizabeth Palau Other Clinician: Referring Tymira Horkey: Treating Caston Coopersmith/Extender: Duanne Limerick in Treatment: 10 Active Inactive Wound/Skin Impairment Nursing  Diagnoses: Impaired tissue integrity Knowledge deficit related to ulceration/compromised skin integrity Goals: Patient/caregiver will verbalize understanding of skin care regimen Date Initiated: 09/15/2020 Target Resolution Date: 12/22/2020 Goal Status: Active Ulcer/skin breakdown will have a volume reduction of 30% by week 4 Date Initiated: 09/15/2020 Date Inactivated: 10/13/2020 Target Resolution Date: 10/13/2020 Goal Status: Unmet Unmet Reason: infection Interventions: Assess patient/caregiver ability to obtain necessary supplies Assess patient/caregiver ability to perform ulcer/skin care regimen upon admission and as needed Assess ulceration(s) every visit  Provide education on ulcer and skin care Treatment Activities: Skin care regimen initiated : 09/15/2020 Topical wound management initiated : 09/15/2020 Notes: Electronic Signature(s) Signed: 11/24/2020 5:50:42 PM By: Zenaida Deed RN, BSN Entered By: Zenaida Deed on 11/24/2020 10:55:02 -------------------------------------------------------------------------------- Pain Assessment Details Patient Name: Date of Service: CHRISTAL, LAGERSTROM 11/24/2020 10:15 A M Medical Record Number: [id number] Patient Account Number: [id number] Date of Birth/Sex: Treating RN: 04-10-1966 (55 y.o. [name] Primary Care Madisin Hasan: Elizabeth Palau Other Clinician: Referring Jarreau Callanan: Treating Tyah Acord/Extender: Duanne Limerick in Treatment: 10 Active Problems Location of Pain Severity and Description of Pain Patient Has Paino Yes Site Locations Rate the pain. Current Pain Level: 6 Pain Management and Medication Current Pain Management: Electronic Signature(s) Signed: 11/24/2020 5:50:42 PM By: Zenaida Deed RN, BSN Signed: 11/29/2020 9:06:40 AM By: Karl Ito Entered By: Karl Ito on 11/24/2020  10:11:47 -------------------------------------------------------------------------------- Patient/Caregiver Education Details Patient Name: Date of Service: [name] 2/2/2022andnbsp10:15 A M Medical Record Number: [id number] Patient Account Number: [id number] Date of Birth/Gender: Treating RN: 1966-09-07 (55 y.o. [name] Primary Care Physician: Elizabeth Palau Other Clinician: Referring Physician: Treating Physician/Extender: Duanne Limerick in Treatment: 10 Education Assessment Education Provided To: Patient Education Topics Provided Wound/Skin Impairment: Methods: Explain/Verbal Responses: Reinforcements needed, State content correctly [employer]) Signed: 11/24/2020 5:50:42 PM By: Zenaida Deed RN, BSN Entered By: Zenaida Deed on 11/24/2020 10:55:29 -------------------------------------------------------------------------------- Wound Assessment Details Patient Name: Date of Service: KIRBIE, STODGHILL 11/24/2020 10:15 A M Medical Record Number: [id number] Patient Account Number: [id number] Date of Birth/Sex: Treating RN: April 22, 1966 (55 y.o. [name] Primary Care Posey Petrik: Elizabeth Palau Other Clinician: Referring Jamorris Ndiaye: Treating Anniyah Mood/Extender: Duanne Limerick in Treatment: 10 Wound Status Wound Number: 1 Primary Diabetic Wound/Ulcer of the Lower Extremity Etiology: Wound Location: Right, Lateral Foot Wound Open Wounding Event: Shear/Friction Status: Date Acquired: 08/23/2020 Comorbid Anemia, Sleep Apnea, Hypotension, Type II Diabetes, Weeks Of Treatment: 10 History: Osteoarthritis, Neuropathy, Received Chemotherapy Clustered Wound: No Photos Photo Uploaded By: Benjaman Kindler on 11/26/2020 14:25:02 Wound Measurements Length: (cm) 0.3 Width: (cm) 0.3 Depth: (cm) 0.1 Area: (cm) 0.071 Volume: (cm) 0.007 % Reduction in Area: 98.2% % Reduction in Volume:  98.2% Epithelialization: Large (67-100%) Tunneling: No Undermining: No Wound Description Classification: Grade 2 Wound Margin: Flat and Intact Exudate Amount: Medium Exudate Type: Serosanguineous Exudate Color: red, brown Foul Odor After Cleansing: No Slough/Fibrino Yes Wound Bed Granulation Amount: Large (67-100%) Exposed Structure Granulation Quality: Pink, Pale Fascia Exposed: No Necrotic Amount: None Present (0%) Fat Layer (Subcutaneous Tissue) Exposed: Yes Tendon Exposed: No Muscle Exposed: No Joint Exposed: No Bone Exposed: No Treatment Notes Wound #1 (Foot) Wound Laterality: Right, Lateral Cleanser Soap and Water Discharge Instruction: May shower and wash wound with dial antibacterial soap and water prior to dressing change. Peri-Wound Care Topical Mupirocin Ointment Discharge Instruction: Apply Mupirocin (Bactroban) as instructed Primary Dressing Secondary Dressing ComfortFoam Border, 2x2 in (silicone border) Discharge Instruction: Apply over primary dressing or bandaid Secured With Compression Wrap Compression Stockings Add-Ons Electronic Signature(s) Signed: 11/24/2020 5:46:35 PM By: Zandra Abts RN, BSN Entered By: Zandra Abts on 11/24/2020 10:27:23 -------------------------------------------------------------------------------- Wound Assessment Details Patient Name: Date of Service: KITZIA, CAMUS 11/24/2020 10:15 A M Medical Record Number: [id number] Patient Account Number: [id number] Date of Birth/Sex: Treating RN: 03-29-1966 (55 y.o. [name] Primary Care Morgan Rennert: Elizabeth Palau Other Clinician: Referring Johannes Everage: Treating Janki Dike/Extender: Duanne Limerick in Treatment: 10 Wound Status Wound Number: 10 Primary Atypical  Etiology: Wound Location: Left, Lateral Upper Leg Wound Open Wounding Event: Gradually Appeared Status: Date Acquired: 11/24/2020 Comorbid Anemia, Sleep Apnea, Hypotension, Type II  Diabetes, Weeks Of Treatment: 0 History: Osteoarthritis, Neuropathy, Received Chemotherapy Clustered Wound: No Photos Photo Uploaded By: Benjaman Kindler on 11/26/2020 14:37:35 Wound Measurements Length: (cm) 1.2 Width: (cm) 0.4 Depth: (cm) 0.1 Area: (cm) 0.377 Volume: (cm) 0.038 % Reduction in Area: % Reduction in Volume: Epithelialization: None Tunneling: No Undermining: No Wound Description Classification: Full Thickness Without Exposed Support Structures Wound Margin: Flat and Intact Exudate Amount: Small Exudate Type: Serosanguineous Exudate Color: red, brown Foul Odor After Cleansing: No Slough/Fibrino Yes Wound Bed Granulation Amount: Small (1-33%) Exposed Structure Granulation Quality: Pink Fascia Exposed: No Necrotic Amount: Large (67-100%) Fat Layer (Subcutaneous Tissue) Exposed: Yes Necrotic Quality: Adherent Slough Tendon Exposed: No Muscle Exposed: No Joint Exposed: No Bone Exposed: No Treatment Notes Wound #10 (Upper Leg) Wound Laterality: Left, Lateral Cleanser Soap and Water Discharge Instruction: May shower and wash wound with dial antibacterial soap and water prior to dressing change. Peri-Wound Care Topical Mupirocin Ointment Discharge Instruction: Apply Mupirocin (Bactroban) as instructed Clobetasol Cream Discharge Instruction: mixed with mupirocin Primary Dressing Secondary Dressing ComfortFoam Border, 2x2 in (silicone border) Discharge Instruction: Apply over primary dressing as directed. Secured With Compression Wrap Compression Stockings Facilities manager) Signed: 11/24/2020 5:50:42 PM By: Zenaida Deed RN, BSN Signed: 11/29/2020 9:06:40 AM By: Karl Ito Entered By: Karl Ito on 11/24/2020 10:22:22 -------------------------------------------------------------------------------- Wound Assessment Details Patient Name: Date of Service: KALYSTA, KNEISLEY 11/24/2020 10:15 A M Medical Record Number:  [id number] Patient Account Number: [id number] Date of Birth/Sex: Treating RN: 04/25/1966 (55 y.o. Billy Coast, Linda Primary Care Agnieszka Newhouse: Elizabeth Palau Other Clinician: Referring Amelia Burgard: Treating Luvern Mischke/Extender: Duanne Limerick in Treatment: 10 Wound Status Wound Number: 11 Primary Atypical Etiology: Wound Location: Right, Medial Lower Leg Wound Open Wounding Event: Gradually Appeared Status: Date Acquired: 11/24/2020 Comorbid Anemia, Sleep Apnea, Hypotension, Type II Diabetes, Weeks Of Treatment: 0 History: Osteoarthritis, Neuropathy, Received Chemotherapy Clustered Wound: No Photos Photo Uploaded By: Benjaman Kindler on 11/26/2020 14:37:36 Wound Measurements Length: (cm) 0.3 Width: (cm) 0.4 Depth: (cm) 0.1 Area: (cm) 0.094 Volume: (cm) 0.009 % Reduction in Area: % Reduction in Volume: Epithelialization: None Tunneling: No Undermining: No Wound Description Classification: Full Thickness Without Exposed Support Structu Wound Margin: Flat and Intact Exudate Amount: Small Exudate Type: Serosanguineous Exudate Color: red, brown res Foul Odor After Cleansing: No Slough/Fibrino Yes Wound Bed Granulation Amount: Medium (34-66%) Exposed Structure Granulation Quality: Pink Fascia Exposed: No Necrotic Amount: Medium (34-66%) Fat Layer (Subcutaneous Tissue) Exposed: Yes Necrotic Quality: Adherent Slough Tendon Exposed: No Muscle Exposed: No Joint Exposed: No Bone Exposed: No Treatment Notes Wound #11 (Lower Leg) Wound Laterality: Right, Medial Cleanser Soap and Water Discharge Instruction: May shower and wash wound with dial antibacterial soap and water prior to dressing change. Peri-Wound Care Topical Mupirocin Ointment Discharge Instruction: Apply Mupirocin (Bactroban) as instructed Clobetasol Cream Discharge Instruction: mixed with mupirocin Primary Dressing Secondary Dressing ComfortFoam Border, 2x2 in (silicone  border) Discharge Instruction: Apply over primary dressing as directed. Secured With Compression Wrap Compression Stockings Facilities manager) Signed: 11/24/2020 5:50:42 PM By: Zenaida Deed RN, BSN Signed: 11/29/2020 9:06:40 AM By: Karl Ito Entered By: Karl Ito on 11/24/2020 10:23:25 -------------------------------------------------------------------------------- Wound Assessment Details Patient Name: Date of Service: NARE, GASPARI 11/24/2020 10:15 A M Medical Record Number: [id number] Patient Account Number: [id number] Date of Birth/Sex: Treating RN: 11/14/1965 (55 y.o. [name] Primary Care Briann Sarchet:  Elizabeth Palau Other Clinician: Referring Makail Watling: Treating Morey Andonian/Extender: Duanne Limerick in Treatment: 10 Wound Status Wound Number: 2 Primary Diabetic Wound/Ulcer of the Lower Extremity Etiology: Wound Location: Right, Plantar T Great oe Wound Amputation Wounding Event: Shear/Friction Status: Date Acquired: 08/23/2020 Outcome Toe/Ray Weeks Of Treatment: 10 Level: Clustered Wound: No Comorbid Anemia, Sleep Apnea, Hypotension, Type II Diabetes, History: Osteoarthritis, Neuropathy, Received Chemotherapy Photos Wound Measurements % Reduction in Area: % Reduction in Volume: Epithelialization: Small (1-33%) Wound Description Classification: Grade 2 Wound Margin: Flat and Intact Exudate Amount: Medium Exudate Type: Serosanguineous Exudate Color: red, brown Foul Odor After Cleansing: No Slough/Fibrino Yes Wound Bed Granulation Amount: Large (67-100%) Exposed Structure Granulation Quality: Pink, Pale Fascia Exposed: No Necrotic Amount: Small (1-33%) Fat Layer (Subcutaneous Tissue) Exposed: Yes Necrotic Quality: Adherent Slough Tendon Exposed: No Muscle Exposed: No Joint Exposed: No Bone Exposed: No Electronic Signature(s) Signed: 11/26/2020 4:00:21 PM By: Benjaman Kindler EMT/HBOT/SD Signed: 11/26/2020  4:47:51 PM By: Zenaida Deed RN, BSN Entered By: Benjaman Kindler on 11/26/2020 14:36:04 -------------------------------------------------------------------------------- Wound Assessment Details Patient Name: Date of Service: NEESHA, LANGTON 11/24/2020 10:15 A M Medical Record Number: [id number] Patient Account Number: [id number] Date of Birth/Sex: Treating RN: Dec 31, 1965 (55 y.o. [name] Primary Care Darivs Lunden: Elizabeth Palau Other Clinician: Referring Kelsee Preslar: Treating Klint Lezcano/Extender: Duanne Limerick in Treatment: 10 Wound Status Wound Number: 3 Primary Etiology: Diabetic Wound/Ulcer of the Lower Extremity Wound Location: Right T Great oe Wound Status: Converted Wounding Event: Trauma Date Acquired: 10/23/2017 Weeks Of Treatment: 10 Clustered Wound: No Photos Photo Uploaded By: Benjaman Kindler on 11/26/2020 14:25:58 Wound Measurements Length: (cm) 0 Width: (cm) 0 Depth: (cm) 0 Area: (cm) 0 Volume: (cm) 0 % Reduction in Area: 100% % Reduction in Volume: 100% Wound Description Classification: Grade 2 Treatment Notes Wound #3 (Toe Great) Wound Laterality: Right Cleanser Peri-Wound Care Topical Primary Dressing Secondary Dressing Secured With Compression Wrap Compression Stockings Add-Ons Electronic Signature(s) Signed: 11/24/2020 5:46:35 PM By: Zandra Abts RN, BSN Entered By: Zandra Abts on 11/24/2020 10:29:14 -------------------------------------------------------------------------------- Wound Assessment Details Patient Name: Date of Service: TELICIA, HODGKISS 11/24/2020 10:15 A M Medical Record Number: [id number] Patient Account Number: [id number] Date of Birth/Sex: Treating RN: 1966/07/22 (55 y.o. [name] Primary Care Lawyer Washabaugh: Elizabeth Palau Other Clinician: Referring Ashok Sawaya: Treating Kenyada Dosch/Extender: Duanne Limerick in Treatment: 10 Wound Status Wound Number: 6 Primary  Lesion Etiology: Wound Location: Left, Lateral Lower Leg Wound Healed - Epithelialized Wounding Event: Gradually Appeared Status: Date Acquired: 09/24/2020 Comorbid Anemia, Sleep Apnea, Hypotension, Type II Diabetes, Weeks Of Treatment: 8 History: Osteoarthritis, Neuropathy, Received Chemotherapy Clustered Wound: No Photos Photo Uploaded By: Benjaman Kindler on 11/26/2020 14:37:13 Wound Measurements Length: (cm) Width: (cm) Depth: (cm) Area: (cm) Volume: (cm) 0 % Reduction in Area: 100% 0 % Reduction in Volume: 100% 0 Epithelialization: Large (67-100%) 0 Tunneling: No 0 Undermining: No Wound Description Classification: Partial Thickness Exudate Amount: None Present Foul Odor After Cleansing: No Slough/Fibrino No Wound Bed Granulation Amount: None Present (0%) Exposed Structure Necrotic Amount: None Present (0%) Fascia Exposed: No Fat Layer (Subcutaneous Tissue) Exposed: No Tendon Exposed: No Muscle Exposed: No Joint Exposed: No Bone Exposed: No Electronic Signature(s) Signed: 11/24/2020 5:46:35 PM By: Zandra Abts RN, BSN Entered By: Zandra Abts on 11/24/2020 10:27:39 -------------------------------------------------------------------------------- Wound Assessment Details Patient Name: Date of Service: ALEGRA, ROST 11/24/2020 10:15 A M Medical Record Number: [id number] Patient Account Number: [id number] Date of Birth/Sex: Treating RN: [birth year] (55 y.o. Dorthula Perfect, Emeterio Reeve  Primary Care Ann-Marie Kluge: Elizabeth PalauAnderson, [name] Other Clinician: Referring Loni Delbridge: Treating Monteen Toops/Extender: Duanne LimerickStone III, Hoyt Anderson, [name] [name]: 10 Wound Status Wound Number: 7 Primary Lesion Etiology: Wound Location: Left, Proximal Lower Leg Wound Open Wounding Event: Gradually Appeared Status: Date Acquired: 09/24/2020 Comorbid Anemia, Sleep Apnea, Hypotension, Type II Diabetes, Weeks Of Treatment: 8 History: Osteoarthritis, Neuropathy, Received Chemotherapy Clustered  Wound: No Photos Photo Uploaded By: Benjaman KindlerJones, Dedrick on 11/26/2020 14:37:13 Wound Measurements Length: (cm) 0.4 Width: (cm) 0.4 Depth: (cm) 0.1 Area: (cm) 0.126 Volume: (cm) 0.013 % Reduction in Area: 83.9% % Reduction in Volume: 83.5% Epithelialization: Medium (34-66%) Tunneling: No Undermining: No Wound Description Classification: Partial Thickness Wound Margin: Flat and Intact Exudate Amount: Medium Exudate Type: Serosanguineous Exudate Color: red, brown Foul Odor After Cleansing: No Slough/Fibrino No Wound Bed Granulation Amount: Large (67-100%) Exposed Structure Granulation Quality: Red Fascia Exposed: No Necrotic Amount: None Present (0%) Fat Layer (Subcutaneous Tissue) Exposed: Yes Tendon Exposed: No Muscle Exposed: No Joint Exposed: No Bone Exposed: No Treatment Notes Wound #7 (Lower Leg) Wound Laterality: Left, Proximal Cleanser Soap and Water Discharge Instruction: May shower and wash wound with dial antibacterial soap and water prior to dressing change. Peri-Wound Care Topical Mupirocin Ointment Discharge Instruction: Apply Mupirocin (Bactroban) as instructed Clobetasol Cream Discharge Instruction: mixed with mupirocin Primary Dressing Secondary Dressing ComfortFoam Border, 2x2 in (silicone border) Discharge Instruction: Apply over primary dressing as directed. Secured With Compression Wrap Compression Stockings Facilities managerAdd-Ons Electronic Signature(s) Signed: 11/24/2020 5:46:35 PM By: Zandra AbtsLynch, Shatara RN, BSN Entered By: Zandra AbtsLynch, Shatara on 11/24/2020 10:28:14 -------------------------------------------------------------------------------- Wound Assessment Details Patient Name: Date of Service: Glendon AxeHA TLEY, Dustee 11/24/2020 10:15 A M Medical Record Number: [id number] Patient Account Number: [id number] Date of Birth/Sex: Treating RN: January 23, 1966 (55 y.o. Wynelle LinkF) Lynch, Shatara Primary Care Laurier Jasperson: Elizabeth PalauAnderson, [name] Other Clinician: Referring Raissa Dam: Treating  Kallee Nam/Extender: Duanne LimerickStone III, Hoyt Anderson, [name] [name]: 10 Wound Status Wound Number: 9 Primary Etiology: Diabetic Wound/Ulcer of the Lower Extremity Wound Location: Left T Fourth oe Wound Status: Converted Wounding Event: Gradually Appeared Date Acquired: 10/08/2020 Weeks Of Treatment: 6 Clustered Wound: No Wound Measurements Length: (cm) Width: (cm) Depth: (cm) Area: (cm) Volume: (cm) 0 % Reduction in Area: 100% 0 % Reduction in Volume: 100% 0 0 0 Wound Description Classification: Unable to visualize wound bed Treatment Notes Wound #9 (Toe Fourth) Wound Laterality: Left Cleanser Peri-Wound Care Topical Primary Dressing Secondary Dressing Secured With Compression Wrap Compression Stockings Add-Ons Electronic Signature(s) Signed: 11/24/2020 5:46:35 PM By: Zandra AbtsLynch, Shatara RN, BSN Entered By: Zandra AbtsLynch, Shatara on 11/24/2020 10:25:45 -------------------------------------------------------------------------------- Vitals Details Patient Name: Date of Service: Cheryl FlashHA TLEY, Jadeyn 11/24/2020 10:15 A M Medical Record Number: [id number] Patient Account Number: [id number] Date of Birth/Sex: Treating RN: January 23, 1966 (55 y.o. Tommye StandardF) Boehlein, Linda Primary Care Meliah Appleman: Elizabeth PalauAnderson, [name] Other Clinician: Referring Bawi Lakins: Treating Emilianna Barlowe/Extender: Duanne LimerickStone III, Hoyt Anderson, [name] [name]: 10 Vital Signs Time Taken: 10:11 Temperature (F): 98.9 Height (in): 66 Pulse (bpm): 109 Weight (lbs): 195 Respiratory Rate (breaths/min): 18 Body Mass Index (BMI): 31.5 Blood Pressure (mmHg): 92/68 Reference Range: 80 - 120 mg / dl Electronic Signature(s) Signed: 11/29/2020 9:06:40 AM By: Karl Itoawkins, Destiny Entered By: Karl Itoawkins, Destiny on 11/24/2020 10:11:28

## 2020-12-15 ENCOUNTER — Encounter (HOSPITAL_BASED_OUTPATIENT_CLINIC_OR_DEPARTMENT_OTHER): Payer: BC Managed Care – PPO | Admitting: Physician Assistant

## 2021-01-12 ENCOUNTER — Other Ambulatory Visit: Payer: Self-pay

## 2021-01-12 ENCOUNTER — Encounter (HOSPITAL_BASED_OUTPATIENT_CLINIC_OR_DEPARTMENT_OTHER): Payer: BC Managed Care – PPO | Attending: Physician Assistant | Admitting: Physician Assistant

## 2021-01-12 DIAGNOSIS — W860XXA Exposure to domestic wiring and appliances, initial encounter: Secondary | ICD-10-CM | POA: Diagnosis not present

## 2021-01-12 DIAGNOSIS — L97812 Non-pressure chronic ulcer of other part of right lower leg with fat layer exposed: Secondary | ICD-10-CM | POA: Insufficient documentation

## 2021-01-12 DIAGNOSIS — E0922 Drug or chemical induced diabetes mellitus with diabetic chronic kidney disease: Secondary | ICD-10-CM | POA: Diagnosis not present

## 2021-01-12 DIAGNOSIS — L97922 Non-pressure chronic ulcer of unspecified part of left lower leg with fat layer exposed: Secondary | ICD-10-CM | POA: Diagnosis not present

## 2021-01-12 DIAGNOSIS — Z89422 Acquired absence of other left toe(s): Secondary | ICD-10-CM | POA: Insufficient documentation

## 2021-01-12 DIAGNOSIS — E09622 Drug or chemical induced diabetes mellitus with other skin ulcer: Secondary | ICD-10-CM | POA: Diagnosis not present

## 2021-01-12 DIAGNOSIS — E09621 Drug or chemical induced diabetes mellitus with foot ulcer: Secondary | ICD-10-CM | POA: Diagnosis not present

## 2021-01-12 DIAGNOSIS — L97122 Non-pressure chronic ulcer of left 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: Secondary | ICD-10-CM | POA: Insufficient documentation

## 2021-01-12 DIAGNOSIS — L97822 Non-pressure chronic ulcer of other part of left lower leg with fat layer exposed: Secondary | ICD-10-CM | POA: Insufficient documentation

## 2021-01-12 DIAGNOSIS — N189 Chronic kidney disease, unspecified: Secondary | ICD-10-CM | POA: Insufficient documentation

## 2021-01-12 DIAGNOSIS — L97512 Non-pressure chronic ulcer of other part of right foot with fat layer exposed: Secondary | ICD-10-CM | POA: Insufficient documentation

## 2021-01-12 DIAGNOSIS — T24311A Burn of third degree of right thigh, initial encounter: Secondary | ICD-10-CM | POA: Diagnosis not present

## 2021-01-12 DIAGNOSIS — M3131 Wegener's granulomatosis with renal involvement: Secondary | ICD-10-CM | POA: Insufficient documentation

## 2021-01-12 DIAGNOSIS — L97819 Non-pressure chronic ulcer of other part of right lower leg with unspecified severity: Secondary | ICD-10-CM | POA: Diagnosis present

## 2021-01-12 NOTE — Progress Notes (Addendum)
DERISHA, FUNDERBURKE ([id number]) Visit Report for 01/12/2021 Chief Complaint Document Details Patient Name: Date of Service: [name], [name] 01/12/2021 10:30 A M Medical Record Number: [id number] Patient Account Number: [id number] Date of Birth/Sex: Treating RN: 07/07/1966 (55 y.o. [name] Primary Care Provider: Elizabeth Palau Other Clinician: Referring Provider: Treating Provider/Extender: Duanne Limerick in Treatment: 17 Information Obtained from: Patient Chief Complaint Bilateral LE Ulcers Electronic Signature(s) Signed: 01/12/2021 10:54:05 AM By: Lenda Kelp PA-C Entered By: Lenda Kelp on 01/12/2021 10:54:05 -------------------------------------------------------------------------------- HPI Details Patient Name: Date of Service: [name], [name] 01/12/2021 10:30 A M Medical Record Number: [id number] Patient Account Number: [id number] Date of Birth/Sex: Treating RN: 09-Dec-1965 (55 y.o. [name] Primary Care Provider: Elizabeth Palau Other Clinician: Referring Provider: Treating Provider/Extender: Duanne Limerick in Treatment: 17 History of Present Illness HPI Description: 09/15/2020 upon evaluation today patient appears to be doing somewhat poorly in regard to the wounds on her feet. She has a wound on the right lateral foot, right medial great toe, right dorsal great toe, left lateral foot, and right lateral lower leg. Currently based on what we are seeing the patient states that the worst issue is her right great toe. She dates that she wishes Dr. Victorino Dike had actually gone ahead and performed amputation at this site as well when he did the other toes they have all healed well that is the right third toe, left great toe, and left third toe. All are completely healed and did excellent. Currently she has been transition into Cam boots for both feet previously she was using postop shoes but apparently this caused some  rubbing on the lateral aspects of both feet and at the medial aspect of her right great toe which unfortunately has caused her a lot of discomfort and issues. She has been given mupirocin and betamethasone for some of the small wounds that occur over her legs and arms frequently. 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 Otherwise she also does have diabetes mellitus type 2. Currently the patient is on methotrexate and also undergoes chemotherapy infusions every 6 months for the Wegener's granulomatosis currently her amputations were asked on August 12, 2020 that was with Dr. Victorino Dike. His physician assistant Jill Alexanders referred her to me today. 09/22/2020 upon evaluation today patient appears to be making good progress with the Santyl at this point. I am extremely pleased with where things stand today and I do believe him to be able to debride some of the left lateral foot wound which we have not been able to do as the eschar was too thick. I think this is doing much better currently. 09/29/2020 upon evaluation today patient appears to be doing decently well in regard to her ulcers on her feet. I do believe the Santyl has been of benefit. With that being said I am concerned about the fact that she continues to have wounds over her arms and legs bilaterally which seem to continually be an issue. Fortunately there is no signs of active infection at this time but again I am not really sure where these are coming from. She is seeing dermatology they had suggested this was pyoderma but that seems kind of unusual based on how they seem to come up. She has been using a mix of steroid and mupirocin over these areas. 12/17; this is a patient called in  urgently to be seen. She was apparently in the shower washing the wound on the left lateral fifth metatarsal head and noticed a hole present. She  also complained of increasing pain.No systemic signs or symptoms. 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 She says her wound started from friction with Cam boots that she had after amputation of three toes two on the left and one on the right 10/13/2020 upon evaluation today patient appears to be doing really about the same in regard to her feet. She is scheduled for amputation surgery on the 30th of this month December 2021. 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 With that being said she does seem somewhat a little down about everything going on obviously she was hopeful to try to get the wound is healed things just have not progressed as we were hoping. Fortunately there is no signs of active infection at this time. No fevers, chills, nausea, vomiting, or diarrhea. 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 11/24/2020 patient presents for reevaluation here in our clinic after having had surgery with Dr. Victorino Dike on 10/21/2020. 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 The patient also had percutaneous left Achilles tendon lengthening. Subsequently she has done fairly well with regard to these surgeries. The right great toe has healed nicely the left transmetatarsal amputation has mostly healed although there is a small area that looks like it may be attempting to dehisce. With that being said Dr. Victorino Dike still has the sutures in place I did not do anything with this wound today though the patient wanted me to look at it. I explained that I do think Dr. Victorino Dike needs to remove the sutures when he thinks that is appropriate and subsequently if we need to help with trying to get it healed if  there is a small dehiscence I will be more than happy to aid in that regard. All this was discussed with the patient during the office visit today. 01/12/2021 upon evaluation today patient appears to be doing actually fairly well considering the last time I saw her things were much more significant. Fortunately there does not appear to be any evidence of infection at any site. Mainly the open areas that she has right now is a burn which is 1/3 degree burn on her right thigh which occurred as a result of a curling iron injury. This is more recent and just the past several days. Fortunately there does not appear to be any evidence of infection right now which is great news. I do believe we need to try to help clean this up I think Santyl would do quite well. She does tell me that she needs to have treatment for her Wegener's granulomatosis. They have been holding off on that however due to the fact of the concerned about her healing. At this point I think if she needs to have the treatment then from a rheumatology standpoint if they feel that is necessary than she would just have to proceed with that and will have to do what we can as far as helping with healing is concerned. Electronic Signature(s) Signed: 01/12/2021 11:46:19 AM By: Lenda Kelp PA-C Entered By: Lenda Kelp  on 01/12/2021 11:46:19 -------------------------------------------------------------------------------- Dressings and/or debridement of burns; small Details Patient Name: Date of Service: [name], [name] 01/12/2021 10:30 A M Medical Record Number: [id number] Patient Account Number: [id number] Date of Birth/Sex: Treating RN: [birth year] (55 y.o. [name] Primary Care Provider: Elizabeth Palau Other Clinician: Referring Provider: Treating Provider/Extender: Duanne Limerick in Treatment: 17 Procedure Performed for: Wound #12 Right Upper Leg Performed By: Physician Lenda Kelp, PA Post  Procedure Diagnosis Same as Pre-procedure Notes santyl and foam border Electronic Signature(s) Signed: 01/12/2021 5:29:52 PM By: Lenda Kelp PA-C Signed: 01/12/2021 5:47:10 PM By: Zenaida Deed RN, BSN Entered By: Zenaida Deed on 01/12/2021 11:44:59 -------------------------------------------------------------------------------- Physical Exam Details Patient Name: Date of Service: [name], [name] 01/12/2021 10:30 A M Medical Record Number: [id number] Patient Account Number: [id number] Date of Birth/Sex: Treating RN: 06-28-1966 (55 y.o. [name] Primary Care Provider: Elizabeth Palau Other Clinician: Referring Provider: Treating Provider/Extender: Duanne Limerick in Treatment: 17 Constitutional Well-nourished and well-hydrated in no acute distress. Respiratory normal breathing without difficulty. Psychiatric this patient is able to make decisions and demonstrates good insight into disease process. Alert and Oriented x 3. pleasant and cooperative. Notes Upon inspection patient's wound bed actually showed signs of good granulation and epithelization at this point. There does not appear to be any evidence of infection at this time which is great news. I feel like everything is doing well except for the burn on the thigh which is actually the probably only location that is can require any debridement I really think Santyl is appropriate here. In regard to the left lateral foot and the transmetatarsal amputation site line this is a little bit of dehiscence that is a little bit deeper fortunately there is no signs of infection I Ernie Hew see about what we can do in that regard. Electronic Signature(s) Signed: 01/12/2021 11:46:56 AM By: Lenda Kelp PA-C Entered By: Lenda Kelp on 01/12/2021 11:46:56 -------------------------------------------------------------------------------- Physician Orders Details Patient Name: Date of Service: LAWANDA, HOLZHEIMER  01/12/2021 10:30 A M Medical Record Number: [id number] Patient Account Number: [id number] Date of Birth/Sex: Treating RN: 25-Jul-1966 (55 y.o. Billy Coast, Linda Primary Care Provider: Elizabeth Palau Other Clinician: Referring Provider: Treating Provider/Extender: Duanne Limerick in Treatment: [phone] Verbal / Phone Orders: No Diagnosis Coding ICD-10 Coding Code Description E11.621 Type 2 diabetes mellitus with foot ulcer L97.512 Non-pressure chronic ulcer of other part of right foot with fat layer exposed L97.812 Non-pressure chronic ulcer of other part of right lower leg with fat layer exposed L97.122 Non-pressure chronic ulcer of left 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 L97.822 Non-pressure chronic ulcer of other part of left lower leg with fat layer exposed 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 Bathing/ Shower/ Hygiene May shower and wash wound with soap and water. - shower with hibiclens soap weekly Home Health New wound care orders this week; continue Home Health for wound care. May utilize formulary equivalent dressing for wound treatment orders unless otherwise specified. Other Home Health Orders/Instructions: - [facility] Wound Treatment Wound #1 - Foot Wound Laterality: Right, Lateral Cleanser: Soap and Water 3 x Per Week/30 Days Discharge Instructions: May shower and wash wound with dial antibacterial soap and water prior to dressing change. Topical: Mupirocin Ointment 3 x Per Week/30 Days Discharge Instructions: Apply Mupirocin (Bactroban) as instructed Secondary Dressing: ComfortFoam Border, 2x2 in (silicone border) 3 x Per Week/30 Days  Discharge Instructions: Apply over primary dressing or bandaid to protect Wound #11 - Lower Leg Wound Laterality: Right, Medial Cleanser: Soap and Water 1 x Per Day/30 Days Discharge Instructions: May shower and wash  wound with dial antibacterial soap and water prior to dressing change. Topical: Mupirocin Ointment 1 x Per Day/30 Days Discharge Instructions: Apply Mupirocin (Bactroban) mixed with triamcinolone cream to wound bed Topical: Triamcinolone 1 x Per Day/30 Days Discharge Instructions: Apply Triamcinolone as directed Secondary Dressing: ComfortFoam Border, 2x2 in (silicone border) 1 x Per Day/30 Days Discharge Instructions: Apply over primary dressing as directed. Wound #12 - Upper Leg Wound Laterality: Right Prim Dressing: Santyl Ointment (Home Health) 1 x Per Day/30 Days ary Discharge Instructions: Apply nickel thick amount to wound bed as instructed Secondary Dressing: Woven Gauze Sponge, Non-Sterile 4x4 in (Home Health) 1 x Per Day/30 Days Discharge Instructions: moisten with saline and place over santyl Secondary Dressing: ComfortFoam Border, 4x4 in (silicone border) (Home Health) 1 x Per Day/30 Days Discharge Instructions: Apply over primary dressing as directed. Wound #13 - Amputation Site - Transmetatarsal Wound Laterality: Left Prim Dressing: Promogran Prisma Matrix, 4.34 (sq in) (silver collagen) 3 x Per Week/30 Days ary Discharge Instructions: Moisten collagen with saline to base of wound Secondary Dressing: Woven Gauze Sponge, Non-Sterile 4x4 in 3 x Per Week/30 Days Discharge Instructions: Apply over primary dressing as directed. Secondary Dressing: Woven Gauze Sponges 2x2 in (Home Health) 3 x Per Week/30 Days Discharge Instructions: moisten with saline and pack into wound behind collagen Secured With: Kerlix Roll Sterile, 4.5x3.1 (in/yd) (Home Health) 3 x Per Week/30 Days Discharge Instructions: Secure with Kerlix as directed. Patient Medications llergies: ibuprofen, penicillin A Notifications Medication Indication Start End 01/12/2021 Santyl DOSE topical 250 unit/gram ointment - ointment topical Apply nickel thick daily to the wound bed and then cover with a dressing as  directed in clinic x 30 days Electronic Signature(s) Signed: 01/12/2021 11:51:36 AM By: Lenda Kelp PA-C Entered By: Lenda Kelp on 01/12/2021 11:51:35 -------------------------------------------------------------------------------- Problem List Details Patient Name: Date of Service: [name], [name] 01/12/2021 10:30 A M Medical Record Number: [id number] Patient Account Number: [id number] Date of Birth/Sex: Treating RN: 1966/01/26 (55 y.o. Billy Coast, Linda Primary Care Provider: Elizabeth Palau Other Clinician: Referring Provider: Treating Provider/Extender: Duanne Limerick in Treatment: 17 Active Problems ICD-10 Encounter Code Description Active Date MDM Diagnosis E11.621 Type 2 diabetes mellitus with foot ulcer 09/15/2020 No Yes T24.311A Burn of third degree of right thigh, initial encounter 01/12/2021 No Yes L97.512 Non-pressure chronic ulcer of other part of right foot with fat layer exposed 09/15/2020 No Yes L97.812 Non-pressure chronic ulcer of other part of right lower leg with fat layer 09/15/2020 No Yes exposed L97.122 Non-pressure chronic ulcer of left 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 10/08/2020 No Yes L97.822 Non-pressure chronic ulcer of other part of left lower leg with fat layer exposed2/11/2020 No Yes M31.31 Wegener's granulomatosis with renal involvement 09/15/2020 No Yes N18.9 Chronic kidney disease, unspecified 09/15/2020 No Yes Inactive Problems Resolved Problems Electronic Signature(s) Signed: 01/12/2021 11:45:37 AM By: Lenda Kelp PA-C Previous Signature: 01/12/2021 10:53:59 AM Version By: Lenda Kelp PA-C Entered By: Lenda Kelp on 01/12/2021 11:45:37 -------------------------------------------------------------------------------- Progress Note Details Patient Name: Date of Service: [name], Winona 01/12/2021 10:30 A M Medical Record Number: [id number] Patient Account Number: [id number] Date of Birth/Sex: Treating  RN: 08-Aug-1966 (55 y.o. [name] Primary Care Provider: Elizabeth Palau Other Clinician: Referring Provider: Treating Provider/Extender: Levonne Hubert,  Rupert Stacks in Treatment: 17 Subjective Chief Complaint Information obtained from Patient Bilateral LE Ulcers History of Present Illness (HPI) 09/15/2020 upon evaluation today patient appears to be doing somewhat poorly in regard to the wounds on her feet. She has a wound on the right lateral foot, right medial great toe, right dorsal great toe, left lateral foot, and right lateral lower leg. Currently based on what we are seeing the patient states that the worst issue is her right great toe. She dates that she wishes Dr. Victorino Dike had actually gone ahead and performed amputation at this site as well when he did the other toes they have all healed well that is the right third toe, left great toe, and left third toe. All are completely healed and did excellent. Currently she has been transition into Cam boots for both feet previously she was using postop shoes but apparently this caused some rubbing on the lateral aspects of both feet and at the medial aspect of her right great toe which unfortunately has caused her a lot of discomfort and issues. She has been given mupirocin and betamethasone for some of the small wounds that occur over her legs and arms frequently. 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 Otherwise she also does have diabetes mellitus type 2. Currently the patient is on methotrexate and also undergoes chemotherapy infusions every 6 months for the Wegener's granulomatosis currently her amputations were asked on August 12, 2020 that was with Dr. Victorino Dike. His physician assistant Jill Alexanders referred her to me today. 09/22/2020 upon evaluation today patient appears to be making good progress with  the Santyl at this point. I am extremely pleased with where things stand today and I do believe him to be able to debride some of the left lateral foot wound which we have not been able to do as the eschar was too thick. I think this is doing much better currently. 09/29/2020 upon evaluation today patient appears to be doing decently well in regard to her ulcers on her feet. I do believe the Santyl has been of benefit. With that being said I am concerned about the fact that she continues to have wounds over her arms and legs bilaterally which seem to continually be an issue. Fortunately there is no signs of active infection at this time but again I am not really sure where these are coming from. She is seeing dermatology they had suggested this was pyoderma but that seems kind of unusual based on how they seem to come up. She has been using a mix of steroid and mupirocin over these areas. 12/17; this is a patient called in urgently to be seen. She was apparently in the shower washing the wound on the left lateral fifth metatarsal head and noticed a hole present. She also complained of increasing pain.No systemic signs or symptoms. 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 She says her wound started from friction with Cam boots that she had after amputation of three toes two on the left and one on the right 10/13/2020 upon evaluation today patient appears to be doing really about the same in regard to her feet. She is scheduled for amputation surgery on the 30th of this month December 2021. 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 With  that being said she does seem somewhat a little down about everything going on obviously she was hopeful to try to get the wound is healed things just have not progressed as we were hoping. Fortunately there is no signs of active infection at this time.  No fevers, chills, nausea, vomiting, or diarrhea. 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 11/24/2020 patient presents for reevaluation here in our clinic after having had surgery with Dr. Victorino Dike on 10/21/2020. 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 The patient also had percutaneous left Achilles tendon lengthening. Subsequently she has done fairly well with regard to these surgeries. The right great toe has healed nicely the left transmetatarsal amputation has mostly healed although there is a small area that looks like it may be attempting to dehisce. With that being said Dr. Victorino Dike still has the sutures in place I did not do anything with this wound today though the patient wanted me to look at it. I explained that I do think Dr. Victorino Dike needs to remove the sutures when he thinks that is appropriate and subsequently if we need to help with trying to get it healed if there is a small dehiscence I will be more than happy to aid in that regard. All this was discussed with the patient during the office visit today. 01/12/2021 upon evaluation today patient appears to be doing actually fairly well considering the last time I saw her things were much more significant. Fortunately there does not appear to be any evidence of infection at any site. Mainly the open areas that she has right now is a burn which is 1/3 degree burn on her right thigh which occurred as a result of a curling iron injury. This is more recent and just the past several days. Fortunately there does not appear to be any evidence of infection right now which is great news. I do believe we need to try to help clean this up I think Santyl would do quite well. She does tell me that she needs to have treatment for her Wegener's granulomatosis. They  have been holding off on that however due to the fact of the concerned about her healing. At this point I think if she needs to have the treatment then from a rheumatology standpoint if they feel that is necessary than she would just have to proceed with that and will have to do what we can as far as helping with healing is concerned. Objective Constitutional Well-nourished and well-hydrated in no acute distress. Vitals Time Taken: 11:00 AM, Height: 66 in, Weight: 195 lbs, BMI: 31.5, Temperature: 98.3 F, Pulse: 62 bpm, Respiratory Rate: 18 breaths/min, Blood Pressure: 115/79 mmHg, Capillary Blood Glucose: 109 mg/dl. Respiratory normal breathing without difficulty. Psychiatric this patient is able to make decisions and demonstrates good insight into disease process. Alert and Oriented x 3. pleasant and cooperative. General Notes: Upon inspection patient's wound bed actually showed signs of good granulation and epithelization at this point. There does not appear to be any evidence of infection at this time which is great news. I feel like everything is doing well except for the burn on the thigh which is actually the probably only location that is can require any debridement I really think Santyl is appropriate here. In regard to the left lateral foot and  the transmetatarsal amputation site line this is a little bit of dehiscence that is a little bit deeper fortunately there is no signs of infection I Ernie HewMinna see about what we can do in that regard. Integumentary (Hair, Skin) Wound #1 status is Open. Original cause of wound was Shear/Friction. The date acquired was: 08/23/2020. The wound has been in treatment 17 weeks. The wound is located on the Right,Lateral Foot. The wound measures 0cm length x 0cm width x 0cm depth; 0cm^2 area and 0cm^3 volume. There is Fat Layer (Subcutaneous Tissue) exposed. There is no tunneling or undermining noted. There is a small amount of serosanguineous drainage noted.  The wound margin is flat and intact. There is large (67-100%) red granulation within the wound bed. There is no necrotic tissue within the wound bed. Wound #10 status is Healed - Epithelialized. Original cause of wound was Gradually Appeared. The date acquired was: 11/24/2020. The wound has been in treatment 7 weeks. The wound is located on the Left,Lateral Upper Leg. The wound measures 0cm length x 0cm width x 0cm depth; 0cm^2 area and 0cm^3 volume. Wound #11 status is Open. Original cause of wound was Gradually Appeared. The date acquired was: 11/24/2020. The wound has been in treatment 7 weeks. The wound is located on the Right,Medial Lower Leg. The wound measures 0.4cm length x 0.3cm width x 0.1cm depth; 0.094cm^2 area and 0.009cm^3 volume. There is Fat Layer (Subcutaneous Tissue) exposed. There is no tunneling or

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
## 2021-01-19 ENCOUNTER — Encounter (HOSPITAL_BASED_OUTPATIENT_CLINIC_OR_DEPARTMENT_OTHER): Payer: BC Managed Care – PPO | Admitting: Physician Assistant

## 2021-01-19 ENCOUNTER — Other Ambulatory Visit: Payer: Self-pay

## 2021-01-19 DIAGNOSIS — E09621 Drug or chemical induced diabetes mellitus with foot ulcer: Secondary | ICD-10-CM | POA: Diagnosis not present

## 2021-01-19 NOTE — Progress Notes (Addendum)
JAYELYN, BARNO ([id number]) Visit Report for 01/19/2021 Chief Complaint Document Details Patient Name: Date of Service: MISKI, FELDPAUSCH 01/19/2021 12:45 PM Medical Record Number: [id number] Patient Account Number: [id number] Date of Birth/Sex: Treating RN: [birth year] (55 y.o. [name] Primary Care Provider: Elizabeth Palau Other Clinician: Referring Provider: Treating Provider/Extender: Duanne Limerick in Treatment: 18 Information Obtained from: Patient Chief Complaint Bilateral LE Ulcers Electronic Signature(s) Signed: 01/19/2021 1:11:57 PM By: Lenda Kelp PA-C Entered By: Lenda Kelp on 01/19/2021 13:11:56 -------------------------------------------------------------------------------- HPI Details Patient Name: Date of Service: SKYLEY, GRANDMAISON 01/19/2021 12:45 PM Medical Record Number: [id number] Patient Account Number: [id number] Date of Birth/Sex: Treating RN: 11-07-1965 (55 y.o. [name] Primary Care Provider: Elizabeth Palau Other Clinician: Referring Provider: Treating Provider/Extender: Duanne Limerick in Treatment: 18 History of Present Illness HPI Description: 09/15/2020 upon evaluation today patient appears to be doing somewhat poorly in regard to the wounds on her feet. She has a wound on the right lateral foot, right medial great toe, right dorsal great toe, left lateral foot, and right lateral lower leg. Currently based on what we are seeing the patient states that the worst issue is her right great toe. She dates that she wishes Dr. Victorino Dike had actually gone ahead and performed amputation at this site as well when he did the other toes they have all healed well that is the right third toe, left great toe, and left third toe. All are completely healed and did excellent. Currently she has been transition into Cam boots for both feet previously she was using postop shoes but apparently this caused some rubbing  on the lateral aspects of both feet and at the medial aspect of her right great toe which unfortunately has caused her a lot of discomfort and issues. She has been given mupirocin and betamethasone for some of the small wounds that occur over her legs and arms frequently. 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 Otherwise she also does have diabetes mellitus type 2. Currently the patient is on methotrexate and also undergoes chemotherapy infusions every 6 months for the Wegener's granulomatosis currently her amputations were asked on August 12, 2020 that was with Dr. Victorino Dike. His physician assistant Jill Alexanders referred her to me today. 09/22/2020 upon evaluation today patient appears to be making good progress with the Santyl at this point. I am extremely pleased with where things stand today and I do believe him to be able to debride some of the left lateral foot wound which we have not been able to do as the eschar was too thick. I think this is doing much better currently. 09/29/2020 upon evaluation today patient appears to be doing decently well in regard to her ulcers on her feet. I do believe the Santyl has been of benefit. With that being said I am concerned about the fact that she continues to have wounds over her arms and legs bilaterally which seem to continually be an issue. Fortunately there is no signs of active infection at this time but again I am not really sure where these are coming from. She is seeing dermatology they had suggested this was pyoderma but that seems kind of unusual based on how they seem to come up. She has been using a mix of steroid and mupirocin over these areas. 12/17; this is a patient called in urgently to  be seen. She was apparently in the shower washing the wound on the left lateral fifth metatarsal head and noticed a hole present. She also  complained of increasing pain.No systemic signs or symptoms. 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 She says her wound started from friction with Cam boots that she had after amputation of three toes two on the left and one on the right 10/13/2020 upon evaluation today patient appears to be doing really about the same in regard to her feet. She is scheduled for amputation surgery on the 30th of this month December 2021. 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 With that being said she does seem somewhat a little down about everything going on obviously she was hopeful to try to get the wound is healed things just have not progressed as we were hoping. Fortunately there is no signs of active infection at this time. No fevers, chills, nausea, vomiting, or diarrhea. 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 11/24/2020 patient presents for reevaluation here in our clinic after having had surgery with Dr. Victorino Dike on 10/21/2020. 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 The patient also had percutaneous left Achilles tendon lengthening. Subsequently she has done fairly well with regard to these surgeries. The right great toe has healed nicely the left transmetatarsal amputation has mostly healed although there is a small area that looks like it may be attempting to dehisce. With that being said Dr. Victorino Dike still has the sutures in place I did not do anything with this wound today though the patient wanted me to look at it. I explained that I do think Dr. Victorino Dike needs to remove the sutures when he thinks that is appropriate and subsequently if we need to help with trying to get it healed if there is  a small dehiscence I will be more than happy to aid in that regard. All this was discussed with the patient during the office visit today. 01/12/2021 upon evaluation today patient appears to be doing actually fairly well considering the last time I saw her things were much more significant. Fortunately there does not appear to be any evidence of infection at any site. Mainly the open areas that she has right now is a burn which is 1/3 degree burn on her right thigh which occurred as a result of a curling iron injury. This is more recent and just the past several days. Fortunately there does not appear to be any evidence of infection right now which is great news. I do believe we need to try to help clean this up I think Santyl would do quite well. She does tell me that she needs to have treatment for her Wegener's granulomatosis. They have been holding off on that however due to the fact of the concerned about her healing. At this point I think if she needs to have the treatment then from a rheumatology standpoint if they feel that is necessary than she would just have to proceed with that and will have to do what we can as far as helping with healing is concerned. 01/19/2021 on evaluation today patient appears to be doing decently well in regard to her wounds. The [facility]  is loosening up the necrotic tissue on the surface of the burn on her thigh. Fortunately that does not appear to be infected. 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 Electronic Signature(s) Signed: 01/19/2021 2:17:51 PM By: Lenda Kelp PA-C Entered By: Lenda Kelp on 01/19/2021 14:17:50 -------------------------------------------------------------------------------- Dressings and/or debridement of burns; small Details Patient Name: Date of Service: CAILEIGH, CANCHE 01/19/2021 12:45 PM Medical Record Number:  [id number] Patient Account Number: [id number] Date of Birth/Sex: Treating RN: 1966/03/24 (55 y.o. [name] Primary Care Provider: Elizabeth Palau Other Clinician: Referring Provider: Treating Provider/Extender: Duanne Limerick in Treatment: 18 Procedure Performed for: Wound #12 Right Upper Leg Performed By: Physician Lenda Kelp, PA Post Procedure Diagnosis Same as Pre-procedure Notes using #3 curette and santyl Electronic Signature(s) Signed: 01/19/2021 5:20:30 PM By: Lenda Kelp PA-C Signed: 01/19/2021 5:50:20 PM By: Zenaida Deed RN, BSN Entered By: Zenaida Deed on 01/19/2021 14:06:54 -------------------------------------------------------------------------------- Physical Exam Details Patient Name: Date of Service: JALINE, PINCOCK 01/19/2021 12:45 PM Medical Record Number: [id number] Patient Account Number: [id number] Date of Birth/Sex: Treating RN: 1965-11-05 (55 y.o. [name] Primary Care Provider: Elizabeth Palau Other Clinician: Referring Provider: Treating Provider/Extender: Duanne Limerick in Treatment: 18 Constitutional Well-nourished and well-hydrated in no acute distress. Respiratory normal breathing without difficulty. Psychiatric this patient is able to make decisions and demonstrates good insight into disease process. Alert and Oriented x 3. pleasant and cooperative. Notes Patient's wound bed showed signs of good granulation and epithelization in regard to the transmetatarsal amputation site. I do not see any signs of infection and again I think that this is just can take time to granulate in. Again her rheumatologist is somewhat concerned about whether or not they can get her back on the treatment for her Wegener's. With that being said I really have not told her that she could not do that in fact my conversation with her last week was that if they felt she absolutely needed to do it even  if it slowed down her ability to heal that would still be the best thing for her. Again I am not 100% sure how much this is really going on prevent her from healing as it stands anyway. Neither wound however appears to be infected. Electronic Signature(s) Signed: 01/19/2021 2:18:43 PM By: Lenda Kelp PA-C Entered By: Lenda Kelp on 01/19/2021 14:18:42 -------------------------------------------------------------------------------- Physician Orders Details Patient Name: Date of Service: ANGLE, DIRUSSO 01/19/2021 12:45 PM Medical Record Number: [id number] Patient Account Number: [id number] Date of Birth/Sex: Treating RN: Feb 22, 1966 (55 y.o. Billy Coast, Linda Primary Care Provider: Elizabeth Palau Other Clinician: Referring Provider: Treating Provider/Extender: Duanne Limerick in Treatment: [phone] Verbal / Phone Orders: No Diagnosis Coding ICD-10 Coding Code Description E11.621 Type 2 diabetes mellitus with foot ulcer T24.311A Burn of third degree of right thigh, initial encounter L97.512 Non-pressure chronic ulcer of other part of right foot with fat layer exposed L97.812 Non-pressure chronic ulcer of other part of right lower leg with fat layer exposed L97.122 Non-pressure chronic ulcer of left 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 L97.822 Non-pressure chronic ulcer of other part of left lower leg with fat layer exposed 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 Bathing/ Shower/ Hygiene May shower and wash  wound with soap and water. - shower with hibiclens soap weekly Home Health No change in wound care orders this week; continue Home Health for wound care. May utilize formulary equivalent dressing for wound treatment orders unless otherwise specified. Other Home Health Orders/Instructions: - [facility] Wound Treatment Wound #12 - Upper Leg Wound Laterality:  Right Prim Dressing: Santyl Ointment (Home Health) 1 x Per Day/30 Days ary Discharge Instructions: Apply nickel thick amount to wound bed as instructed Secondary Dressing: Woven Gauze Sponge, Non-Sterile 4x4 in (Home Health) 1 x Per Day/30 Days Discharge Instructions: moisten with saline and place over santyl Secondary Dressing: ComfortFoam Border, 4x4 in (silicone border) (Home Health) 1 x Per Day/30 Days Discharge Instructions: Apply over primary dressing as directed. Wound #13 - Amputation Site - Transmetatarsal Wound Laterality: Left Prim Dressing: Promogran Prisma Matrix, 4.34 (sq in) (silver collagen) ary 3 x Per Week/30 Days Discharge Instructions: Moisten collagen with saline to base of wound Secondary Dressing: Woven Gauze Sponge, Non-Sterile 4x4 in 3 x Per Week/30 Days Discharge Instructions: Apply over primary dressing as directed. Secondary Dressing: Woven Gauze Sponges 2x2 in (Home Health) 3 x Per Week/30 Days Discharge Instructions: moisten with saline and pack into wound behind collagen Secured With: Kerlix Roll Sterile, 4.5x3.1 (in/yd) (Home Health) 3 x Per Week/30 Days Discharge Instructions: Secure with Kerlix as directed. Electronic Signature(s) Signed: 01/19/2021 5:20:30 PM By: Lenda Kelp PA-C Signed: 01/19/2021 5:50:20 PM By: Zenaida Deed RN, BSN Entered By: Zenaida Deed on 01/19/2021 14:10:35 -------------------------------------------------------------------------------- Problem List Details Patient Name: Date of Service: LEDONNA, DORMER 01/19/2021 12:45 PM Medical Record Number: [id number] Patient Account Number: [id number] Date of Birth/Sex: Treating RN: 10-24-1965 (55 y.o. Billy [place], Linda Primary Care Provider: Elizabeth Palau Other Clinician: Referring Provider: Treating Provider/Extender: Duanne Limerick in Treatment: 18 Active Problems ICD-10 Encounter Code Description Active Date MDM Diagnosis E11.621 Type 2  diabetes mellitus with foot ulcer 09/15/2020 No Yes T24.311A Burn of third degree of right thigh, initial encounter 01/12/2021 No Yes L97.512 Non-pressure chronic ulcer of other part of right foot with fat layer exposed 09/15/2020 No Yes L97.812 Non-pressure chronic ulcer of other part of right lower leg with fat layer 09/15/2020 No Yes exposed L97.122 Non-pressure chronic ulcer of left 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 10/08/2020 No Yes L97.822 Non-pressure chronic ulcer of other part of left lower leg with fat layer exposed2/11/2020 No Yes M31.31 Wegener's granulomatosis with renal involvement 09/15/2020 No Yes N18.9 Chronic kidney disease, unspecified 09/15/2020 No Yes Inactive Problems Resolved Problems Electronic Signature(s) Signed: 01/19/2021 1:11:47 PM By: Lenda Kelp PA-C Entered By: Lenda Kelp on 01/19/2021 13:11:47 -------------------------------------------------------------------------------- Progress Note Details Patient Name: Date of Service: ELIANI, LECLERE 01/19/2021 12:45 PM Medical Record Number: [id number] Patient Account Number: [id number] Date of Birth/Sex: Treating RN: 05-01-1966 (55 y.o. [name] Primary Care Provider: Elizabeth Palau Other Clinician: Referring Provider: Treating Provider/Extender: Duanne Limerick in Treatment: 18 Subjective Chief Complaint Information obtained from Patient Bilateral LE Ulcers History of Present Illness (HPI) 09/15/2020 upon evaluation today patient appears to be doing somewhat poorly in regard to the wounds on her feet. She has a wound on the right lateral foot, right medial great toe, right dorsal great toe, left lateral foot, and right lateral lower leg. Currently based on what we are seeing the patient states that the worst issue is her right great toe. She dates that she wishes Dr. Victorino Dike had actually gone ahead and performed amputation at this site  as well when he did the other toes they  have all healed well that is the right third toe, left great toe, and left third toe. All are completely healed and did excellent. Currently she has been transition into Cam boots for both feet previously she was using postop shoes but apparently this caused some rubbing on the lateral aspects of both feet and at the medial aspect of her right great toe which unfortunately has caused her a lot of discomfort and issues. She has been given mupirocin and betamethasone for some of the small wounds that occur over her legs and arms frequently. 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 Otherwise she also does have diabetes mellitus type 2. Currently the patient is on methotrexate and also undergoes chemotherapy infusions every 6 months for the Wegener's granulomatosis currently her amputations were asked on August 12, 2020 that was with Dr. Victorino Dike. His physician assistant Jill Alexanders referred her to me today. 09/22/2020 upon evaluation today patient appears to be making good progress with the Santyl at this point. I am extremely pleased with where things stand today and I do believe him to be able to debride some of the left lateral foot wound which we have not been able to do as the eschar was too thick. I think this is doing much better currently. 09/29/2020 upon evaluation today patient appears to be doing decently well in regard to her ulcers on her feet. I do believe the Santyl has been of benefit. With that being said I am concerned about the fact that she continues to have wounds over her arms and legs bilaterally which seem to continually be an issue. Fortunately there is no signs of active infection at this time but again I am not really sure where these are coming from. She is seeing dermatology they had suggested this was pyoderma but that seems kind of unusual based on how they seem  to come up. She has been using a mix of steroid and mupirocin over these areas. 12/17; this is a patient called in urgently to be seen. She was apparently in the shower washing the wound on the left lateral fifth metatarsal head and noticed a hole present. She also complained of increasing pain.No systemic signs or symptoms. 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 She says her wound started from friction with Cam boots that she had after amputation of three toes two on the left and one on the right 10/13/2020 upon evaluation today patient appears to be doing really about the same in regard to her feet. She is scheduled for amputation surgery on the 30th of this month December 2021. 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 With that being said she does seem somewhat a little down about everything going on obviously she was hopeful to try to get the wound is healed things just have not progressed as we were hoping. Fortunately there is no signs of active infection at this time. No fevers, chills, nausea, vomiting, or diarrhea. 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 11/24/2020 patient presents for reevaluation here in our clinic after having had surgery with  Dr. Victorino Dike on 10/21/2020. 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 The patient also had percutaneous left Achilles tendon lengthening. Subsequently she has done fairly well with regard to these surgeries. The right great toe has healed nicely the left transmetatarsal amputation has mostly healed although there is a small area that looks like it may be attempting to dehisce. With that being said Dr. Victorino Dike still has the sutures in place  I did not do anything with this wound today though the patient wanted me to look at it. I explained that I do think Dr. Victorino Dike needs to remove the sutures when he thinks that is appropriate and subsequently if we need to help with trying to get it healed if there is a small dehiscence I will be more than happy to aid in that regard. All this was discussed with the patient during the office visit today. 01/12/2021 upon evaluation today patient appears to be doing actually fairly well considering the last time I saw her things were much more significant. Fortunately there does not appear to be any evidence of infection at any site. Mainly the open areas that she has right now is a burn which is 1/3 degree burn on her right thigh which occurred as a result of a curling iron injury. This is more recent and just the past several days. Fortunately there does not appear to be any evidence of infection right now which is great news. I do believe we need to try to help clean this up I think Santyl would do quite well. She does tell me that she needs to have treatment for her Wegener's granulomatosis. They have been holding off on that however due to the fact of the concerned about her healing. At this point I think if she needs to have the treatment then from a rheumatology standpoint if they feel that is necessary than she would just have to proceed with that and will have to do what we can as far as helping with healing is concerned. 01/19/2021 on evaluation today patient appears to be doing decently well in regard to her wounds. The Santyl is loosening up the necrotic tissue on the surface of the burn on her thigh. Fortunately that does not appear to be infected. 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 Objective Constitutional Well-nourished and well-hydrated in no acute distress. Vitals  Time Taken: 1:19 PM, Height: 66 in, Weight: 195 lbs, BMI: 31.5, Temperature: 98.6 F, Pulse: 78 bpm, Respiratory Rate: 16 breaths/min, Blood Pressure: 100/69 mmHg. Respiratory normal breathing without difficulty. Psychiatric this patient is able to make decisions and demonstrates good insight into disease process. Alert and Oriented x 3. pleasant and cooperative. General Notes: Patient's wound bed showed signs of good granulation and epithelization in regard to the transmetatarsal amputation site. I do not see any signs of infection and again I think that this is just can take time to granulate in. Again her rheumatologist is somewhat concerned about whether or not they can get her back on the treatment for her Wegener's. With that being said I really have not told her that she could not do that in fact my conversation with her last week was that if they felt she absolutely needed to do it even if it slowed down her ability to heal that would still be  the best thing for her. Again I am not 100% sure how much this is really going on prevent her from healing as it stands anyway. Neither wound however appears to be infected. Integumentary (Hair, Skin) Wound #1 status is Healed - Epithelialized. Original cause of wound was Shear/Friction. The date acquired was: 08/23/2020. The wound has been in treatment 18 weeks. The wound is located on the Right,Lateral Foot. The wound measures 0cm length x 0cm width x 0cm depth; 0cm^2 area and 0cm^3 volume. There is no tunneling or undermining noted. There is a none present amount of drainage noted. The wound margin is flat and intact. There is no granulation within the wound bed. There is no necrotic tissue within the wound bed. Wound #11 status is Healed - Epithelialized. Original cause of wound was Gradually Appeared. The date acquired was: 11/24/2020. The wound has been in treatment 8 weeks. The wound is located on the Right,Medial Lower Leg. The wound measures 0cm  length x 0cm width x 0cm depth; 0cm^2 area and 0cm^3 volume. There is no tunneling or undermining noted. There is a none present amount of drainage noted. The wound margin is flat and intact. There is no granulation within the wound bed. There is no necrotic tissue within the wound bed. Wound #12 status is Open. Original cause of wound was Thermal Burn. The date acquired was: 12/29/2020. The wound has been in treatment 1 weeks. The wound is located on the Right Upper Leg. The wound measures 3.2cm length x 4.8cm width x 0.1cm depth; 12.064cm^2 area and 1.206cm^3 volume. There is no tunneling or undermining noted. There is a small amount of serous drainage noted. The wound margin is distinct with the outline attached to the wound base. There is no granulation within the wound bed. There is a large (67-100%) amount of necrotic tissue within the wound bed including Adherent Slough. Wound #13 status is Open. Original cause of wound was Surgical Injury. The date acquired was: 10/11/2020. The wound has been in treatment 1 weeks. 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 The wound measures 0.2cm length x 1.5cm width x 0.7cm depth; 0.236cm^2 area and 0.165cm^3 volume. There is Fat Layer (Subcutaneous Tissue) exposed. There is undermining starting at 5:00 and ending at 9:00 with a maximum distance of 0.5cm. There is a medium amount of serosanguineous drainage noted. The wound margin is thickened. There is large (67-100%) pink granulation within the wound bed. There is no necrotic tissue within the wound bed. 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 An Dressings and/or debridement of burns; small procedure was performed by Lenda Kelp, PA. Post procedure Diagnosis Wound #12: Same as Pre-Procedure Notes: using #3 curette and santyl Plan Follow-up Appointments: Return Appointment in 1 week. Bathing/ Shower/ Hygiene: May shower and wash wound with soap and water. - shower with hibiclens soap weekly Home Health: No change in wound care orders this week; continue Home Health for  wound care. May utilize formulary equivalent dressing for wound treatment orders unless otherwise specified. Other Home Health Orders/Instructions: [facility] WOUND #12: - Upper Leg Wound Laterality: Right Prim Dressing: Santyl Ointment (Home Health) 1 x Per Day/30 Days ary Discharge Instructions: Apply nickel thick amount to wound bed as instructed Secondary Dressing: Woven Gauze Sponge, Non-Sterile 4x4 in (Home Health) 1 x Per Day/30 Days Discharge Instructions: moisten with saline and place over santyl Secondary Dressing: ComfortFoam Border, 4x4 in (silicone border) (Home Health) 1 x Per Day/30 Days Discharge Instructions: Apply over primary dressing as directed. WOUND #13: - Amputation Site - Transmetatarsal Wound Laterality: Left Prim Dressing: Promogran Prisma Matrix, 4.34 (sq in) (silver collagen) 3 x Per Week/30 Days ary Discharge Instructions: Moisten collagen with saline to base of wound Secondary Dressing: Woven Gauze Sponge, Non-Sterile 4x4 in 3 x Per Week/30 Days Discharge Instructions: Apply over primary dressing as directed. Secondary Dressing: Woven Gauze Sponges 2x2 in (Home Health) 3 x Per Week/30 Days Discharge Instructions: moisten with saline and pack into wound behind collagen Secured  With: Kerlix Roll Sterile, 4.5x3.1 (in/yd) (Home Health) 3 x Per Week/30 Days Discharge Instructions: Secure with Kerlix as directed. 1. Would recommend currently that we continue with the collagen for the transmetatarsal amputation site and she is in agreement with that plan. They are packing a little bit of gauze and behind to hold the collagen down into place. 2. With regard to the burn on her thigh that is something that I think the Melburn Popper is doing a good job with. We did debride that today to try to clear away some of the necrotic debris on the surface I was not able to completely clean it but I do believe what we did is going to allow for more appropriate use of the Santyl to penetrate the deeper layers and hopefully get this cleaner faster. We will see patient back for reevaluation in 1 week here in the clinic. If anything worsens or changes patient will contact our office for additional recommendations. 01/19/2021 I did at around 4:30 PM in the afternoon contact Dr. Talbert Cage who is the patient's rheumatologist in order to discuss the situation whether or not she can proceed with the treatment for the Wegener's. Again she does not have any evidence of infection or osteomyelitis specifically which I discussed with him as well and he feels that it is okay for her to go forward as do I. At worst case scenario she may delay in healing which I did discuss with her today as well but nonetheless I think she needs to do which she has to as far as her medical condition is concerned he is in agreement with that plan. Therefore they can go forward with the infusion. Electronic Signature(s) Signed: 01/19/2021 4:34:36 PM By: Lenda Kelp PA-C Previous Signature: 01/19/2021 2:19:26 PM Version By: Lenda Kelp PA-C Entered By: Lenda Kelp on 01/19/2021 16:34:35 -------------------------------------------------------------------------------- SuperBill Details Patient Name: Date of Service: ERIKAH, THUMM 01/19/2021 Medical Record Number: [id number] Patient Account Number: [id number] Date of Birth/Sex: Treating RN: 06-08-1966 (55 y.o. Billy Coast, Linda Primary Care Provider: Elizabeth Palau Other Clinician: Referring Provider: Treating Provider/Extender: Duanne Limerick in Treatment: 18 Diagnosis Coding ICD-10 Codes Code Description E11.621 Type 2 diabetes mellitus with foot ulcer T24.311A Burn of third degree of right thigh, initial encounter L97.512 Non-pressure chronic ulcer of other part of right foot with fat layer exposed L97.812 Non-pressure chronic ulcer of other part of right lower  leg with fat layer exposed L97.122 Non-pressure chronic ulcer of left 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 L97.822 Non-pressure chronic ulcer of other part of left lower leg with fat layer exposed M31.31 Wegener's granulomatosis with renal involvement N18.9 Chronic kidney disease, unspecified Facility Procedures CPT4 Code: [id number] Description: 16020 - BURN DRSG W/O ANESTH-SM ICD-10 Diagnosis Description T24.311A Burn of third degree of right thigh, initial encounter Modifier: Quantity: 1 Physician Procedures : CPT4 Code Description Modifier [id number] - WC PHYS LEVEL 3 - EST PT 25 ICD-10 Diagnosis Description E11.621 Type 2 diabetes mellitus with foot ulcer T24.311A Burn of third degree of right thigh, initial encounter L97.512 Non-pressure chronic ulcer  of other part of right foot with fat layer exposed L97.812 Non-pressure chronic ulcer of other part of right lower leg with fat layer exposed Quantity: 1 : [id number] 16020 - WC PHYS DRESS/DEBRID SM,<5% TOT BODY SURF ICD-10 Diagnosis Description T24.311A Burn of third degree of right thigh, initial encounter Quantity: 1 Electronic Signature(s) Signed: 01/19/2021 2:21:00 PM By: Lenda KelpStone III, Promiss Labarbera PA-C Entered By: Lenda KelpStone III, Vardaan Depascale on 01/19/2021 14:20:59

## 2021-01-20 NOTE — Progress Notes (Signed)
[name] ([id number]) Visit Report for 01/19/2021 Arrival Information Details Patient Name: Date of Service: [name], [name] 01/19/2021 12:45 PM Medical Record Number: [id number] Patient Account Number: [id number] Date of Birth/Sex: Treating RN: 04-26-1966 (55 y.o. [name], [name] Primary Care Shene Maxfield: Elizabeth Palau Other Clinician: Referring Adellyn Capek: Treating Canaan Prue/Extender: Duanne Limerick in Treatment: 18 Visit Information History Since Last Visit Added or deleted any medications: No Patient Arrived: Ambulatory Any new allergies or adverse reactions: No Arrival Time: 13:17 Had a fall or experienced change in No Accompanied By: family member activities of daily living that may affect Transfer Assistance: None risk of falls: Patient Identification Verified: Yes Signs or symptoms of abuse/neglect since last visito No Secondary Verification Process Completed: Yes Hospitalized since last visit: No Patient Requires Transmission-Based Precautions: No Implantable device outside of the clinic excluding No Patient Has Alerts: No cellular tissue based products placed in the center since last visit: Has Dressing in Place as Prescribed: Yes Pain Present Now: Yes Electronic Signature(s) Signed: 01/20/2021 5:46:28 PM By: Zandra Abts RN, BSN Entered By: Zandra Abts on 01/19/2021 13:19:24 -------------------------------------------------------------------------------- Lower Extremity Assessment Details Patient Name: Date of Service: [name] 01/19/2021 12:45 PM Medical Record Number: [id number] Patient Account Number: [id number] Date of Birth/Sex: Treating RN: 1966/04/12 (55 y.o. [name] Primary Care Ringo Sherod: Elizabeth Palau Other Clinician: Referring Taiki Buckwalter: Treating [place] Masaki/Extender: Duanne Limerick in Treatment: 18 Edema Assessment Assessed: Kyra Searles: No] Franne Forts: No] Edema: [Left: No] [Right:  No] Calf Left: Right: Point of Measurement: 30 cm From Medial Instep 37 cm 37.5 cm Ankle Left: Right: Point of Measurement: 9 cm From Medial Instep 21.4 cm 22.5 cm Vascular Assessment Pulses: Dorsalis Pedis Palpable: [Left:Yes] Electronic Signature(s) Signed: 01/20/2021 5:46:28 PM By: Zandra Abts RN, BSN Entered By: Zandra Abts on 01/19/2021 13:30:13 -------------------------------------------------------------------------------- Multi-Disciplinary Care Plan Details Patient Name: Date of Service: [name], [name] 01/19/2021 12:45 PM Medical Record Number: [id number] Patient Account Number: [id number] Date of Birth/Sex: Treating RN: [birth year] (55 y.o. [name] Primary Care Zayyan Mullen: Elizabeth Palau Other Clinician: Referring Federick Levene: Treating Jennier Schissler/Extender: Duanne Limerick in Treatment: 18 Multidisciplinary Care Plan reviewed with physician Active Inactive Wound/Skin Impairment Nursing Diagnoses: Impaired tissue integrity Knowledge deficit related to ulceration/compromised skin integrity Goals: Patient/caregiver will verbalize understanding of skin care regimen Date Initiated: 09/15/2020 Target Resolution Date: 02/09/2021 Goal Status: Active Ulcer/skin breakdown will have a volume reduction of 30% by week 4 Date Initiated: 09/15/2020 Date Inactivated: 10/13/2020 Target Resolution Date: 10/13/2020 Goal Status: Unmet Unmet Reason: infection Interventions: Assess patient/caregiver ability to obtain necessary supplies Assess patient/caregiver ability to perform ulcer/skin care regimen upon admission and as needed Assess ulceration(s) every visit Provide education on ulcer and skin care Treatment Activities: Skin care regimen initiated : 09/15/2020 Topical wound management initiated : 09/15/2020 Notes: Electronic Signature(s) Signed: 01/19/2021 5:50:20 PM By: Zenaida Deed RN, BSN Entered By: Zenaida Deed on 01/19/2021  13:52:42 -------------------------------------------------------------------------------- Pain Assessment Details Patient Name: Date of Service: [name], [name] 01/19/2021 12:45 PM Medical Record Number: [id number] Patient Account Number: [id number] Date of Birth/Sex: Treating RN: Jan 20, 1966 (55 y.o. [name] Primary Care Macai Sisneros: Elizabeth Palau Other Clinician: Referring Tiana Sivertson: Treating Janes Colegrove/Extender: Duanne Limerick in Treatment: 18 Active Problems Location of Pain Severity and Description of Pain Patient Has Paino Yes Site Locations Pain Location: Generalized Pain With Dressing Change: Yes Duration of the Pain. Constant / Intermittento Intermittent Rate the pain. Current Pain Level: 4 Character of Pain Describe  the Pain: Dull Pain Management and Medication Current Pain Management: Medication: No Cold Application: No Rest: No Massage: No Activity: No T.E.N.S.: No Heat Application: No Leg drop or elevation: No Is the Current Pain Management Adequate: Inadequate How does your wound impact your activities of daily livingo Sleep: No Bathing: No Appetite: No Relationship With Others: No Bladder Continence: No Emotions: No Bowel Continence: No Work: No Toileting: No Drive: No Dressing: No Hobbies: No Electronic Signature(s) Signed: 01/20/2021 5:46:28 PM By: Zandra Abts RN, BSN Entered By: Zandra Abts on 01/19/2021 13:20:11 -------------------------------------------------------------------------------- Patient/Caregiver Education Details Patient Name: Date of Service: [name] 3/30/2022andnbsp12:45 PM Medical Record Number: [id number] Patient Account Number: [id number] Date of Birth/Gender: Treating RN: [birth year] (55 y.o. [name] Primary Care Physician: Elizabeth Palau Other Clinician: Referring Physician: Treating Physician/Extender: Duanne Limerick in Treatment:  18 Education Assessment Education Provided To: Patient Education Topics Provided Wound/Skin Impairment: Methods: Explain/Verbal Responses: Reinforcements needed, State content correctly [employer]) Signed: 01/19/2021 5:50:20 PM By: Zenaida Deed RN, BSN Entered By: Zenaida Deed on 01/19/2021 14:05:10 -------------------------------------------------------------------------------- Wound Assessment Details Patient Name: Date of Service: [name], [name] 01/19/2021 12:45 PM Medical Record Number: [id number] Patient Account Number: [id number] Date of Birth/Sex: Treating RN: 15-May-1966 (55 y.o. [name] Primary Care Alvon Nygaard: Elizabeth Palau Other Clinician: Referring Gor Vestal: Treating Keina Mutch/Extender: Duanne Limerick in Treatment: 18 Wound Status Wound Number: 1 Primary Diabetic Wound/Ulcer of the Lower Extremity Etiology: Wound Location: Right, Lateral Foot Wound Healed - Epithelialized Wounding Event: Shear/Friction Status: Date Acquired: 08/23/2020 Comorbid Anemia, Sleep Apnea, Hypotension, Type II Diabetes, Weeks Of Treatment: 18 History: Osteoarthritis, Neuropathy, Received Chemotherapy Clustered Wound: No Wound Measurements Length: (cm) Width: (cm) Depth: (cm) Area: (cm) Volume: (cm) 0 % Reduction in Area: 100% 0 % Reduction in Volume: 100% 0 Epithelialization: Large (67-100%) 0 Tunneling: No 0 Undermining: No Wound Description Classification: Grade 2 Wound Margin: Flat and Intact Exudate Amount: None Present Foul Odor After Cleansing: No Slough/Fibrino No Wound Bed Granulation Amount: None Present (0%) Exposed Structure Necrotic Amount: None Present (0%) Fascia Exposed: No Fat Layer (Subcutaneous Tissue) Exposed: No Tendon Exposed: No Muscle Exposed: No Joint Exposed: No Bone Exposed: No Electronic Signature(s) Signed: 01/20/2021 5:46:28 PM By: Zandra Abts RN, BSN Entered By: Zandra Abts on  01/19/2021 13:32:06 -------------------------------------------------------------------------------- Wound Assessment Details Patient Name: Date of Service: KAMRI, [name] 01/19/2021 12:45 PM Medical Record Number: [id number] Patient Account Number: [id number] Date of Birth/Sex: Treating RN: 1966/06/12 (55 y.o. [name] Primary Care Beatriz Quintela: Elizabeth Palau Other Clinician: Referring Brytnee Bechler: Treating Kery Haltiwanger/Extender: Duanne Limerick in Treatment: 18 Wound Status Wound Number: 11 Primary Atypical Etiology: Wound Location: Right, Medial Lower Leg Wound Healed - Epithelialized Wounding Event: Gradually Appeared Status: Date Acquired: 11/24/2020 Comorbid Anemia, Sleep Apnea, Hypotension, Type II Diabetes, Weeks Of Treatment: 8 History: Osteoarthritis, Neuropathy, Received Chemotherapy Clustered Wound: No Wound Measurements Length: (cm) Width: (cm) Depth: (cm) Area: (cm) Volume: (cm) 0 % Reduction in Area: 100% 0 % Reduction in Volume: 100% 0 Epithelialization: Large (67-100%) 0 Tunneling: No 0 Undermining: No Wound Description Classification: Full Thickness Without Exposed Support Structures Wound Margin: Flat and Intact Exudate Amount: None Present Foul Odor After Cleansing: No Slough/Fibrino No Wound Bed Granulation Amount: None Present (0%) Exposed Structure Necrotic Amount: None Present (0%) Fascia Exposed: No Fat Layer (Subcutaneous Tissue) Exposed: No Tendon Exposed: No Muscle Exposed: No Joint Exposed: No Bone Exposed: No Electronic Signature(s) Signed: 01/20/2021 5:46:28 PM By: Zandra Abts  RN, BSN Entered By: Zandra Abts on 01/19/2021 13:32:28 -------------------------------------------------------------------------------- Wound Assessment Details Patient Name: Date of Service: SARINA, ROBLETO 01/19/2021 12:45 PM Medical Record Number: [id number] Patient Account Number: [id number] Date of Birth/Sex: Treating  RN: 1966-07-30 (55 y.o. [name] Primary Care Oluwadamilare Tobler: Elizabeth Palau Other Clinician: Referring Deward Sebek: Treating Quindell Shere/Extender: Duanne Limerick in Treatment: 18 Wound Status Wound Number: 12 Primary 3rd degree Burn Etiology: Wound Location: Right Upper Leg Wound Open Wounding Event: Thermal Burn Status: Date Acquired: 12/29/2020 Comorbid Anemia, Sleep Apnea, Hypotension, Type II Diabetes, Weeks Of Treatment: 1 History: Osteoarthritis, Neuropathy, Received Chemotherapy Clustered Wound: No Photos Wound Measurements Length: (cm) 3.2 Width: (cm) 4.8 Depth: (cm) 0.1 Area: (cm) 12.064 Volume: (cm) 1.206 % Reduction in Area: 3% % Reduction in Volume: 51.5% Epithelialization: None Tunneling: No Undermining: No Wound Description Classification: Unclassifiable Wound Margin: Distinct, outline attached Exudate Amount: Small Exudate Type: Serous Exudate Color: amber Foul Odor After Cleansing: No Slough/Fibrino Yes Wound Bed Granulation Amount: None Present (0%) Exposed Structure Necrotic Amount: Large (67-100%) Fascia Exposed: No Necrotic Quality: Adherent Slough Fat Layer (Subcutaneous Tissue) Exposed: No Tendon Exposed: No Muscle Exposed: No Joint Exposed: No Bone Exposed: No Electronic Signature(s) Signed: 01/19/2021 5:25:01 PM By: Karl Ito Signed: 01/20/2021 5:46:28 PM By: Zandra Abts RN, BSN Entered By: Karl Ito on 01/19/2021 17:21:01 -------------------------------------------------------------------------------- Wound Assessment Details Patient Name: Date of Service: [name], [name] 01/19/2021 12:45 PM Medical Record Number: [id number] Patient Account Number: [id number] Date of Birth/Sex: Treating RN: June 19, 1966 (55 y.o. [name] Primary Care Macauley Mossberg: Elizabeth Palau Other Clinician: Referring Adler Alton: Treating Loray Akard/Extender: Duanne Limerick in Treatment: 18 Wound  Status Wound Number: 13 Primary Open Surgical Wound Etiology: Wound Location: Left Amputation Site - Transmetatarsal Wound Open Wounding Event: Surgical Injury Status: Date Acquired: 10/11/2020 Comorbid Anemia, Sleep Apnea, Hypotension, Type II Diabetes, Weeks Of Treatment: 1 History: Osteoarthritis, Neuropathy, Received Chemotherapy Clustered Wound: No Photos Wound Measurements Length: (cm) 0.2 Width: (cm) 1.5 Depth: (cm) 0.7 Area: (cm) 0.236 Volume: (cm) 0.165 % Reduction in Area: 31.8% % Reduction in Volume: 40.2% Epithelialization: None Undermining: Yes Starting Position (o'clock): 5 Ending Position (o'clock): 9 Maximum Distance: (cm) 0.5 Wound Description Classification: Full Thickness Without Exposed Support Structures Wound Margin: Thickened Exudate Amount: Medium Exudate Type: Serosanguineous Exudate Color: red, brown Foul Odor After Cleansing: No Slough/Fibrino No Wound Bed Granulation Amount: Large (67-100%) Exposed Structure Granulation Quality: Pink Fascia Exposed: No Necrotic Amount: None Present (0%) Fat Layer (Subcutaneous Tissue) Exposed: Yes Tendon Exposed: No Muscle Exposed: No Joint Exposed: No Bone Exposed: No Electronic Signature(s) Signed: 01/19/2021 5:25:01 PM By: Karl Ito Signed: 01/20/2021 5:46:28 PM By: Zandra Abts RN, BSN Entered By: Karl Ito on 01/19/2021 17:21:26 -------------------------------------------------------------------------------- Vitals Details Patient Name: Date of Service: HANSINI, [name] 01/19/2021 12:45 PM Medical Record Number: [id number] Patient Account Number: [id number] Date of Birth/Sex: Treating RN: January 21, 1966 (55 y.o. [name] Primary Care Irbin Fines: Elizabeth Palau Other Clinician: Referring Ellee Wawrzyniak: Treating Rayburn Mundis/Extender: Duanne Limerick in Treatment: 18 Vital Signs Time Taken: 13:19 Temperature (F): 98.6 Height (in): 66 Pulse (bpm):  78 Weight (lbs): 195 Respiratory Rate (breaths/min): 16 Body Mass Index (BMI): 31.5 Blood Pressure (mmHg): 100/69 Reference Range: 80 - 120 mg / dl Electronic Signature(s) Signed: 01/20/2021 5:46:28 PM By: Zandra Abts RN, BSN Entered By: Zandra Abts on 01/19/2021 13:19:50

## 2021-01-26 ENCOUNTER — Other Ambulatory Visit: Payer: Self-pay

## 2021-01-26 ENCOUNTER — Encounter (HOSPITAL_BASED_OUTPATIENT_CLINIC_OR_DEPARTMENT_OTHER): Payer: BC Managed Care – PPO | Attending: Physician Assistant | Admitting: Physician Assistant

## 2021-01-26 DIAGNOSIS — T24311A Burn of third degree of right thigh, initial encounter: Secondary | ICD-10-CM | POA: Insufficient documentation

## 2021-01-26 DIAGNOSIS — M3131 Wegener's granulomatosis with renal involvement: Secondary | ICD-10-CM | POA: Insufficient documentation

## 2021-01-26 DIAGNOSIS — L97822 Non-pressure chronic ulcer of other part of left lower leg with fat layer exposed: Secondary | ICD-10-CM | POA: Diagnosis not present

## 2021-01-26 DIAGNOSIS — L97122 Non-pressure chronic ulcer of left 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: Secondary | ICD-10-CM | POA: Diagnosis not present

## 2021-01-26 DIAGNOSIS — W860XXA Exposure to domestic wiring and appliances, initial encounter: Secondary | ICD-10-CM | POA: Insufficient documentation

## 2021-01-26 DIAGNOSIS — L97512 Non-pressure chronic ulcer of other part of right foot with fat layer exposed: Secondary | ICD-10-CM | POA: Diagnosis not present

## 2021-01-26 DIAGNOSIS — N183 Chronic kidney disease, stage 3 unspecified: Secondary | ICD-10-CM | POA: Diagnosis not present

## 2021-01-26 DIAGNOSIS — Z89411 Acquired absence of right great toe: Secondary | ICD-10-CM | POA: Diagnosis not present

## 2021-01-26 DIAGNOSIS — E1122 Type 2 diabetes mellitus with diabetic chronic kidney disease: Secondary | ICD-10-CM | POA: Diagnosis not present

## 2021-01-26 DIAGNOSIS — Z89432 Acquired absence of left foot: Secondary | ICD-10-CM | POA: Insufficient documentation

## 2021-01-26 DIAGNOSIS — E11621 Type 2 diabetes mellitus with foot ulcer: Secondary | ICD-10-CM | POA: Insufficient documentation

## 2021-01-26 DIAGNOSIS — L97812 Non-pressure chronic ulcer of other part of right lower leg with fat layer exposed: Secondary | ICD-10-CM | POA: Diagnosis not present

## 2021-01-26 DIAGNOSIS — L97819 Non-pressure chronic ulcer of other part of right lower leg with unspecified severity: Secondary | ICD-10-CM | POA: Diagnosis present

## 2021-01-26 NOTE — Progress Notes (Addendum)
Gregary CromerHATLEY, Tinia ([id number]) Visit Report for 01/26/2021 Chief Complaint Document Details Patient Name: Date of Service: Glendon AxeHA TLEY, Jonisha 01/26/2021 9:45 A M Medical Record Number: [id number] Patient Account Number: [id number] Date of Birth/Sex: Treating RN: December 27, 1965 (55 y.o. Tommye StandardF) Boehlein, Linda Primary Care Provider: Elizabeth PalauAnderson, [name] Other Clinician: Referring Provider: Treating Provider/Extender: Duanne LimerickStone III, Maris Abascal Anderson, [name] [name]: 19 Information Obtained from: Patient Chief Complaint Bilateral LE Ulcers Electronic Signature(s) Signed: 01/26/2021 9:31:04 AM By: Lenda KelpStone III, Akaya Proffit PA-C Entered By: Lenda KelpStone III, Uziel Covault on 01/26/2021 09:31:04 -------------------------------------------------------------------------------- HPI Details Patient Name: Date of Service: [name], Nekeya 01/26/2021 9:45 A M Medical Record Number: [id number] Patient Account Number: [id number] Date of Birth/Sex: Treating RN: December 27, 1965 (55 y.o. Tommye StandardF) Boehlein, Linda Primary Care Provider: Elizabeth PalauAnderson, [name] Other Clinician: Referring Provider: Treating Provider/Extender: Duanne LimerickStone III, Stephaine Breshears Anderson, [name] [name]: 19 History of Present Illness HPI Description: 09/15/2020 upon evaluation today patient appears to be doing somewhat poorly in regard to the wounds on her feet. She has a wound on the right lateral foot, right medial great toe, right dorsal great toe, left lateral foot, and right lateral lower leg. Currently based on what we are seeing the patient states that the worst issue is her right great toe. She dates that she wishes Dr. Victorino DikeHewitt had actually gone ahead and performed amputation at this site as well when he did the other toes they have all healed well that is the right third toe, left great toe, and left third toe. All are completely healed and did excellent. Currently she has been transition into Cam boots for both feet previously she was using postop shoes but apparently this caused some rubbing on  the lateral aspects of both feet and at the medial aspect of her right great toe which unfortunately has caused her a lot of discomfort and issues. She has been given mupirocin and betamethasone for some of the small wounds that occur over her legs and arms frequently. 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 Otherwise she also does have diabetes mellitus type 2. Currently the patient is on methotrexate and also undergoes chemotherapy infusions every 6 months for the Wegener's granulomatosis currently her amputations were asked on August 12, 2020 that was with Dr. Victorino DikeHewitt. His physician assistant Jill AlexandersJustin referred her to me today. 09/22/2020 upon evaluation today patient appears to be making good progress with the Santyl at this point. I am extremely pleased with where things stand today and I do believe him to be able to debride some of the left lateral foot wound which we have not been able to do as the eschar was too thick. I think this is doing much better currently. 09/29/2020 upon evaluation today patient appears to be doing decently well in regard to her ulcers on her feet. I do believe the Santyl has been of benefit. With that being said I am concerned about the fact that she continues to have wounds over her arms and legs bilaterally which seem to continually be an issue. Fortunately there is no signs of active infection at this time but again I am not really sure where these are coming from. She is seeing dermatology they had suggested this was pyoderma but that seems kind of unusual based on how they seem to come up. She has been using a mix of steroid and mupirocin over these areas. 12/17; this is a patient called in  urgently to be seen. She was apparently in the shower washing the wound on the left lateral fifth metatarsal head and noticed a hole present. She also  complained of increasing pain.No systemic signs or symptoms. 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 She says her wound started from friction with Cam boots that she had after amputation of three toes two on the left and one on the right 10/13/2020 upon evaluation today patient appears to be doing really about the same in regard to her feet. She is scheduled for amputation surgery on the 30th of this month December 2021. 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 With that being said she does seem somewhat a little down about everything going on obviously she was hopeful to try to get the wound is healed things just have not progressed as we were hoping. Fortunately there is no signs of active infection at this time. No fevers, chills, nausea, vomiting, or diarrhea. 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 11/24/2020 patient presents for reevaluation here in our clinic after having had surgery with Dr. Victorino Dike on 10/21/2020. 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 The patient also had percutaneous left Achilles tendon lengthening. Subsequently she has done fairly well with regard to these surgeries. The right great toe has healed nicely the left transmetatarsal amputation has mostly healed although there is a small area that looks like it may be attempting to dehisce. With that being said Dr. Victorino Dike still has the sutures in place I did not do anything with this wound today though the patient wanted me to look at it. I explained that I do think Dr. Victorino Dike needs to remove the sutures when he thinks that is appropriate and subsequently if we need to help with trying to get it healed if there is  a small dehiscence I will be more than happy to aid in that regard. All this was discussed with the patient during the office visit today. 01/12/2021 upon evaluation today patient appears to be doing actually fairly well considering the last time I saw her things were much more significant. Fortunately there does not appear to be any evidence of infection at any site. Mainly the open areas that she has right now is a burn which is 1/3 degree burn on her right thigh which occurred as a result of a curling iron injury. This is more recent and just the past several days. Fortunately there does not appear to be any evidence of infection right now which is great news. I do believe we need to try to help clean this up I think Santyl would do quite well. She does tell me that she needs to have treatment for her Wegener's granulomatosis. They have been holding off on that however due to the fact of the concerned about her healing. At this point I think if she needs to have the treatment then from a rheumatology standpoint if they feel that is necessary than she would just have to proceed with that and will have to do what we can as far as helping with healing is concerned. 01/19/2021 on evaluation today patient appears to be doing decently well in regard to her wounds.  The Santyl is loosening up the necrotic tissue on the surface of the burn on her thigh. Fortunately that does not appear to be infected. 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 01/26/2021 upon evaluation today patient appears to be doing well in regard to her amputation site on the left. This is a left foot transmetatarsal region. With that being said unfortunately her toe on the right foot is showing signs of being more erythematous and swollen today. I think that we can probably need to put her on a stronger oral antibiotic she  has been taking Bactrim 3 times a week but obviously that is not doing the job. Electronic Signature(s) Signed: 01/26/2021 11:06:39 AM By: Lenda Kelp PA-C Entered By: Lenda Kelp on 01/26/2021 11:06:39 -------------------------------------------------------------------------------- Dressings and/or debridement of burns; small Details Patient Name: Date of Service: KIDA, DIGIULIO 01/26/2021 9:45 A M Medical Record Number: [id number] Patient Account Number: [id number] Date of Birth/Sex: Treating RN: Apr 04, 1966 (55 y.o. [name] Primary Care Provider: Elizabeth Palau Other Clinician: Referring Provider: Treating Provider/Extender: Duanne Limerick in Treatment: 19 Procedure Performed for: Wound #12 Right Upper Leg Performed By: Physician Lenda Kelp, PA Post Procedure Diagnosis Same as Pre-procedure Notes sharply debrided with #3 curette then applied santyl Electronic Signature(s) Signed: 01/26/2021 12:46:28 PM By: Lenda Kelp PA-C Signed: 01/26/2021 5:59:57 PM By: Zenaida Deed RN, BSN Entered By: Zenaida Deed on 01/26/2021 11:00:27 -------------------------------------------------------------------------------- Physical Exam Details Patient Name: Date of Service: ALANII, RAMER 01/26/2021 9:45 A M Medical Record Number: [id number] Patient Account Number: [id number] Date of Birth/Sex: Treating RN: 11-17-1965 (55 y.o. [name] Primary Care Provider: Elizabeth Palau Other Clinician: Referring Provider: Treating Provider/Extender: Duanne Limerick in Treatment: 19 Constitutional Well-nourished and well-hydrated in no acute distress. Respiratory normal breathing without difficulty. Psychiatric this patient is able to make decisions and demonstrates good insight into disease process. Alert and Oriented x 3. pleasant and cooperative. Notes Upon inspection patient's wound bed actually showed signs of good  granulation and epithelization in regard to the transmetatarsal amputation site. Her burn location on the thigh is doing better though there is still some slough noted I did have to perform a burn debridement today using sharp instrumentation in order to clear away the necrotic debris or leads to some degree this to let the Santyl continue to work she tolerated that today without complication. Electronic Signature(s) Signed: 01/26/2021 11:07:09 AM By: Lenda Kelp PA-C Entered By: Lenda Kelp on 01/26/2021 11:07:09 -------------------------------------------------------------------------------- Physician Orders Details Patient Name: Date of Service: LAMESHA, TIBBITS 01/26/2021 9:45 A M Medical Record Number: [id number] Patient Account Number: [id number] Date of Birth/Sex: Treating RN: Dec 01, 1965 (55 y.o. [place], Linda Primary Care Provider: Elizabeth Palau Other Clinician: Referring Provider: Treating Provider/Extender: Duanne Limerick in Treatment: [phone] Verbal / Phone Orders: No Diagnosis Coding ICD-10 Coding Code Description E11.621 Type 2 diabetes mellitus with foot ulcer T24.311A Burn of third degree of right thigh, initial encounter L97.512 Non-pressure chronic ulcer of other part of right foot with fat layer exposed L97.812 Non-pressure chronic ulcer of other part of right lower leg with fat layer exposed L97.122 Non-pressure chronic ulcer of left 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 L97.822 Non-pressure chronic ulcer of other part of left lower leg with fat layer exposed M31.31 Wegener's granulomatosis with  renal involvement N18.9 Chronic kidney disease, unspecified Follow-up Appointments Return Appointment in 1 week. Bathing/ Shower/ Hygiene May shower and wash wound with soap and water. - shower with hibiclens soap weekly Non Wound Condition Other Non Wound Condition Orders/Instructions: - go to emergency room if right toe and foot redness worsens, fever,  chills or increased pain Home Health No change in wound care orders this week; continue Home Health for wound care. May utilize formulary equivalent dressing for wound treatment orders unless otherwise specified. Other Home Health Orders/Instructions: - [facility] Wound Treatment Wound #12 - Upper Leg Wound Laterality: Right Prim Dressing: Santyl Ointment 1 x Per Day/30 Days ary Discharge Instructions: Apply nickel thick amount to wound bed as instructed Secondary Dressing: Woven Gauze Sponge, Non-Sterile 4x4 in (DME) (Generic) 1 x Per Day/30 Days Discharge Instructions: moisten with saline and place over santyl Secondary Dressing: ComfortFoam Border, 4x4 in (silicone border) (DME) (Generic) 1 x Per Day/30 Days Discharge Instructions: Apply over primary dressing as directed. Wound #13 - Amputation Site - Transmetatarsal Wound Laterality: Left Prim Dressing: Promogran Prisma Matrix, 4.34 (sq in) (silver collagen) (DME) (Dispense As Written) 3 x Per Week/30 Days ary Discharge Instructions: Moisten collagen with saline to base of wound Secondary Dressing: Woven Gauze Sponge, Non-Sterile 4x4 in (DME) (Generic) 3 x Per Week/30 Days Discharge Instructions: Apply over primary dressing as directed. Secondary Dressing: Woven Gauze Sponges 2x2 in (DME) (Generic) 3 x Per Week/30 Days Discharge Instructions: moisten with saline and pack into wound behind collagen Secured With: Kerlix Roll Sterile, 4.5x3.1 (in/yd) (DME) (Generic) 3 x Per Week/30 Days Discharge Instructions: Secure with Kerlix as directed. Secured With: 73M Medipore H Soft Cloth Surgical Tape, 2x2 (in/yd) 3 x Per Week/30 Days Discharge Instructions: Secure dressing with tape as directed. Wound #14 - T Second oe Wound Laterality: Right Topical: Mupirocin Ointment 1 x Per Day/15 Days Discharge Instructions: Apply Mupirocin (Bactroban) as instructed Secondary Dressing: Woven Gauze Sponges 2x2 in (DME) (Generic) 1 x Per Day/15 Days Discharge  Instructions: Apply over primary dressing as directed. Secured With: Insurance underwriter, Sterile 2x75 (in/in) (DME) (Generic) 1 x Per Day/15 Days Discharge Instructions: Secure with stretch gauze as directed. Wound #15 - Shoulder Wound Laterality: Right, Posterior Topical: Triamcinolone 1 x Per Day/15 Days Discharge Instructions: Apply Triamcinolone as directed Secondary Dressing: Bordered Gauze, 2x2 in (DME) (Generic) 1 x Per Day/15 Days Discharge Instructions: Apply over primary dressing as directed. Patient Medications llergies: ibuprofen, penicillin A Notifications Medication Indication Start End 01/26/2021 doxycycline hyclate DOSE 1 - oral 100 mg capsule - 1 capsule oral taken 2 times per day for 14 days. do not take iron or magnesium with this medication Electronic Signature(s) Signed: 01/26/2021 11:19:17 AM By: Lenda Kelp PA-C Entered By: Lenda Kelp on 01/26/2021 11:19:15 -------------------------------------------------------------------------------- Problem List Details Patient Name: Date of Service: CHLOE, FLIS 01/26/2021 9:45 A M Medical Record Number: [id number] Patient Account Number: [id number] Date of Birth/Sex: Treating RN: 01/29/1966 (55 y.o. [name] Primary Care Provider: Elizabeth Palau Other Clinician: Referring Provider: Treating Provider/Extender: Duanne Limerick in Treatment: 19 Active Problems ICD-10 Encounter Code Description Active Date MDM Diagnosis E11.621 Type 2 diabetes mellitus with foot ulcer 09/15/2020 No Yes T24.311A Burn of third degree of right thigh, initial encounter 01/12/2021 No Yes L97.512 Non-pressure chronic ulcer of other part of right foot with fat layer exposed 09/15/2020 No Yes L97.812 Non-pressure chronic ulcer of other part of right lower leg with fat layer 09/15/2020 No Yes  exposed L97.122 Non-pressure chronic ulcer of left 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 10/08/2020 No  Yes L97.822 Non-pressure chronic ulcer of other part of left lower leg with fat layer exposed2/11/2020 No Yes M31.31 Wegener's granulomatosis with renal involvement 09/15/2020 No Yes N18.9 Chronic kidney disease, unspecified 09/15/2020 No Yes Inactive Problems Resolved Problems Electronic Signature(s) Signed: 01/26/2021 9:30:58 AM By: Lenda Kelp PA-C Entered By: Lenda Kelp on 01/26/2021 09:30:58 -------------------------------------------------------------------------------- Progress Note Details Patient Name: Date of Service: HA HEATHERLY, STENNER 01/26/2021 9:45 A M Medical Record Number: [id number] Patient Account Number: [id number] Date of Birth/Sex: Treating RN: [birth year] (55 y.o. [name] Primary Care Provider: Elizabeth Palau Other Clinician: Referring Provider: Treating Provider/Extender: Duanne Limerick in Treatment: 19 Subjective Chief Complaint Information obtained from Patient Bilateral LE Ulcers History of Present Illness (HPI) 09/15/2020 upon evaluation today patient appears to be doing somewhat poorly in regard to the wounds on her feet. She has a wound on the right lateral foot, right medial great toe, right dorsal great toe, left lateral foot, and right lateral lower leg. Currently based on what we are seeing the patient states that the worst issue is her right great toe. She dates that she wishes Dr. Victorino Dike had actually gone ahead and performed amputation at this site as well when he did the other toes they have all healed well that is the right third toe, left great toe, and left third toe. All are completely healed and did excellent. Currently she has been transition into Cam boots for both feet previously she was using postop shoes but apparently this caused some rubbing on the lateral aspects of both feet and at the medial aspect of her right great toe which unfortunately has caused her a lot of discomfort and issues. She has been  given mupirocin and betamethasone for some of the small wounds that occur over her legs and arms frequently. 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 Otherwise she also does have diabetes mellitus type 2. Currently the patient is on methotrexate and also undergoes chemotherapy infusions every 6 months for the Wegener's granulomatosis currently her amputations were asked on August 12, 2020 that was with Dr. Victorino Dike. His physician assistant Jill Alexanders referred her to me today. 09/22/2020 upon evaluation today patient appears to be making good progress with the Santyl at this point. I am extremely pleased with where things stand today and I do believe him to be able to debride some of the left lateral foot wound which we have not been able to do as the eschar was too thick. I think this is doing much better currently. 09/29/2020 upon evaluation today patient appears to be doing decently well in regard to her ulcers on her feet. I do believe the Santyl has been of benefit. With that being said I am concerned about the fact that she continues to have wounds over her arms and legs bilaterally which seem to continually be an issue. Fortunately there is no signs of active infection at this time but again I am not really sure where these are coming from. She is seeing dermatology they had suggested this was pyoderma but that seems kind of unusual based on how they seem to come up. She has been using a mix of steroid and mupirocin over these areas. 12/17; this is a patient called in urgently to be seen. She was apparently  in the shower washing the wound on the left lateral fifth metatarsal head and noticed a hole present. She also complained of increasing pain.No systemic signs or symptoms. 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 She says her  wound started from friction with Cam boots that she had after amputation of three toes two on the left and one on the right 10/13/2020 upon evaluation today patient appears to be doing really about the same in regard to her feet. She is scheduled for amputation surgery on the 30th of this month December 2021. 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 With that being said she does seem somewhat a little down about everything going on obviously she was hopeful to try to get the wound is healed things just have not progressed as we were hoping. Fortunately there is no signs of active infection at this time. No fevers, chills, nausea, vomiting, or diarrhea. 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 11/24/2020 patient presents for reevaluation here in our clinic after having had surgery with Dr. Victorino Dike on 10/21/2020. 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 The patient also had percutaneous left Achilles tendon lengthening. Subsequently she has done fairly well with regard to these surgeries. The right great toe has healed nicely the left transmetatarsal amputation has mostly healed although there is a small area that looks like it may be attempting to dehisce. With that being said Dr. Victorino Dike still has the sutures in place I did not do anything with this wound today though the patient wanted me to look at it. I explained that I do think Dr. Victorino Dike needs to remove the sutures when he thinks that is appropriate and subsequently if we need to help with trying to get it healed if there is a small dehiscence I will be more than happy to aid in that regard. All this was discussed with the patient during the office visit today. 01/12/2021 upon evaluation  today patient appears to be doing actually fairly well considering the last time I saw her things were much more significant. Fortunately there does not appear to be any evidence of infection at any site. Mainly the open areas that she has right now is a burn which is 1/3 degree burn on her right thigh which occurred as a result of a curling iron injury. This is more recent and just the past several days. Fortunately there does not appear to be any evidence of infection right now which is great news. I do believe we need to try to help clean this up I think Santyl would do quite well. She does tell me that she needs to have treatment for her Wegener's granulomatosis. They have been holding off on that however due to the fact of the concerned about her healing. At this point I think if she needs to have the treatment then from a rheumatology standpoint if they feel that is necessary than she would just have to proceed with that and will have to do what we can as far as helping with healing is concerned. 01/19/2021 on evaluation today patient appears to be doing decently well in regard to her wounds. The Santyl is loosening up the necrotic  tissue on the surface of the burn on her thigh. Fortunately that does not appear to be infected. 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 01/26/2021 upon evaluation today patient appears to be doing well in regard to her amputation site on the left. This is a left foot transmetatarsal region. With that being said unfortunately her toe on the right foot is showing signs of being more erythematous and swollen today. I think that we can probably need to put her on a stronger oral antibiotic she has been taking Bactrim 3 times a week but obviously that is not doing the job. Objective Constitutional Well-nourished and well-hydrated in no acute  distress. Vitals Time Taken: 9:55 AM, Height: 66 in, Weight: 195 lbs, BMI: 31.5, Temperature: 98.5 F, Pulse: 80 bpm, Respiratory Rate: 16 breaths/min, Blood Pressure: 119/78 mmHg. Respiratory normal breathing without difficulty. Psychiatric this patient is able to make decisions and demonstrates good insight into disease process. Alert and Oriented x 3. pleasant and cooperative. General Notes: Upon inspection patient's wound bed actually showed signs of good granulation and epithelization in regard to the transmetatarsal amputation site. Her burn location on the thigh is doing better though there is still some slough noted I did have to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

## 2021-01-27 NOTE — Progress Notes (Signed)
[name], [name] ([id number]) Visit Report for 01/26/2021 Arrival Information Details Patient Name: Date of Service: [name], [name] 01/26/2021 9:45 A M Medical Record Number: [id number] Patient Account Number: [id number] Date of Birth/Sex: Treating RN: 05-Dec-1965 (55 y.o. [name] Primary Care [name]: [name] Other Clinician: Referring [name]: Treating [name]/Extender: [name] in Treatment: 19 Visit Information History Since Last Visit Added or deleted any medications: No Patient Arrived: Ambulatory Any new allergies or adverse reactions: No Arrival Time: 09:55 Had a fall or experienced change in No Accompanied By: father activities of daily living that may affect Transfer Assistance: None risk of falls: Patient Identification Verified: Yes Signs or symptoms of abuse/neglect since last visito No Secondary Verification Process Completed: Yes Hospitalized since last visit: No Patient Requires Transmission-Based Precautions: No Implantable device outside of the clinic excluding No Patient Has Alerts: No cellular tissue based products placed in the center since last visit: Has Dressing in Place as Prescribed: Yes Pain Present Now: Yes Electronic Signature(s) Signed: 01/27/2021 4:58:23 PM By: [name] Entered By: [name] on 01/26/2021 09:55:53 -------------------------------------------------------------------------------- Encounter Discharge Information Details Patient Name: Date of Service: [name], [name] 01/26/2021 9:45 A M Medical Record Number: [id number] Patient Account Number: [id number] Date of Birth/Sex: Treating RN: 1966/09/09 (55 y.o. [name], [name] Primary Care [name]: [name] Other Clinician: Referring Annastasia Haskins: Treating [name]/Extender: [name] in Treatment: 19 Encounter Discharge Information Items Discharge Condition: Stable Ambulatory Status:  Ambulatory Discharge Destination: Home Transportation: Private Auto Accompanied By: self Schedule Follow-up Appointment: Yes Clinical Summary of Care: Patient Declined Electronic Signature(s) Signed: 01/26/2021 5:28:50 PM By: Stacey Mu RN Entered By: [name] on 01/26/2021 11:31:36 -------------------------------------------------------------------------------- Lower Extremity Assessment Details Patient Name: Date of Service: [name], [name] 01/26/2021 9:45 A M Medical Record Number: [id number] Patient Account Number: [id number] Date of Birth/Sex: Treating RN: 03/09/1966 (55 y.o. [name] Primary Care [name]: [name] Other Clinician: Referring [name]: Treating [name]/Extender: [name] in Treatment: 19 Edema Assessment Assessed: [name]: No] [name]: No] Edema: [Left: No] [Right: No] Calf Left: Right: Point of Measurement: 30 cm From Medial Instep 37 cm 37 cm Ankle Left: Right: Point of Measurement: 9 cm From Medial Instep 21 cm 22 cm Vascular Assessment Pulses: Dorsalis Pedis Palpable: [Left:Yes] [Right:Yes] Electronic Signature(s) Signed: 01/26/2021 5:44:43 PM By: Stacey Abts RN, BSN Entered By: [name] on 01/26/2021 10:11:43 -------------------------------------------------------------------------------- Multi-Disciplinary Care Plan Details Patient Name: Date of Service: [name], [name] 01/26/2021 9:45 A M Medical Record Number: [id number] Patient Account Number: [id number] Date of Birth/Sex: Treating RN: [birth year] (55 y.o. [name] Primary Care Kaliel Bolds: [name] Other Clinician: Referring [name]: Treating [name]/Extender: [name] in Treatment: 19 Multidisciplinary Care Plan reviewed with physician Active Inactive Wound/Skin Impairment Nursing Diagnoses: Impaired tissue integrity Knowledge deficit related to ulceration/compromised skin  integrity Goals: Patient/caregiver will verbalize understanding of skin care regimen Date Initiated: 09/15/2020 Target Resolution Date: 02/09/2021 Goal Status: Active Ulcer/skin breakdown will have a volume reduction of 30% by week 4 Date Initiated: 09/15/2020 Date Inactivated: 10/13/2020 Target Resolution Date: 10/13/2020 Goal Status: Unmet Unmet Reason: infection Interventions: Assess patient/caregiver ability to obtain necessary supplies Assess patient/caregiver ability to perform ulcer/skin care regimen upon admission and as needed Assess ulceration(s) every visit Provide education on ulcer and skin care Treatment Activities: Skin care regimen initiated : 09/15/2020 Topical wound management initiated : 09/15/2020 Notes: Electronic Signature(s) Signed: 01/26/2021 5:59:57 PM By: Stacey Deed RN, BSN Entered  By: [name] on 01/26/2021 10:56:05 -------------------------------------------------------------------------------- Pain Assessment Details Patient Name: Date of Service: [name], [name] 01/26/2021 9:45 A M Medical Record Number: [id number] Patient Account Number: [id number] Date of Birth/Sex: Treating RN: 1966-05-19 (55 y.o. [name] Primary Care [name]: [name] Other Clinician: Referring [name]: Treating [name]/Extender: [name] in Treatment: 19 Active Problems Location of Pain Severity and Description of Pain Patient Has Paino Yes Site Locations Rate the pain. Current Pain Level: 5 Pain Management and Medication Current Pain Management: Electronic Signature(s) Signed: 01/26/2021 5:59:57 PM By: Stacey Deed RN, BSN Signed: 01/27/2021 4:58:23 PM By: [name] Entered By: [name] on 01/26/2021 09:56:19 -------------------------------------------------------------------------------- Patient/Caregiver Education Details Patient Name: Date of Service: [name], [name] 4/6/2022andnbsp9:45 A  M Medical Record Number: [id number] Patient Account Number: [id number] Date of Birth/Gender: Treating RN: Mar 10, 1966 (55 y.o. [name] Primary Care Physician: [name] Other Clinician: Referring Physician: Treating Physician/Extender: [name] in Treatment: 19 Education Assessment Education Provided To: Patient Education Topics Provided Infection: Methods: Explain/Verbal Responses: Reinforcements needed, State content correctly Wound/Skin Impairment: Methods: Explain/Verbal Responses: Reinforcements needed, State content correctly Electronic Signature(s) Signed: 01/26/2021 5:59:57 PM By: Stacey Deed RN, BSN Entered By: [name] on 01/26/2021 10:56:25 -------------------------------------------------------------------------------- Wound Assessment Details Patient Name: Date of Service: [name], [name] 01/26/2021 9:45 A M Medical Record Number: [id number] Patient Account Number: [id number] Date of Birth/Sex: Treating RN: 11/01/1965 (55 y.o. 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 Etiology: Wound Location: Right Upper Leg Wound Open Wounding Event: Thermal Burn Status: Date Acquired: 12/29/2020 Comorbid Anemia, Sleep Apnea, Hypotension, Type II Diabetes, Weeks Of Treatment: 2 History: Osteoarthritis, Neuropathy, Received Chemotherapy Clustered Wound: No Photos Wound Measurements Length: (cm) 3 Width: (cm) 5 Depth: (cm) 0.1 Area: (cm) 11.781 Volume: (cm) 1.178 % Reduction in Area: 5.3% % Reduction in Volume: 52.7% Epithelialization: Small (1-33%) Tunneling: No Undermining: No Wound Description Classification: Full Thickness Without Exposed Support Structures Wound Margin: Distinct, outline attached Exudate Amount:  Medium Exudate Type: Serous Exudate Color: amber Foul Odor After Cleansing: No Slough/Fibrino Yes Wound Bed Granulation Amount: Small (1-33%) Exposed Structure Granulation Quality: Red Fascia Exposed: No Necrotic Amount: Large (67-100%) Fat Layer (Subcutaneous Tissue) Exposed: Yes Necrotic Quality: Adherent Slough Tendon Exposed: No Muscle Exposed: No Joint Exposed: No Bone Exposed: No Treatment Notes Wound #12 (Upper Leg) Wound Laterality: Right Cleanser Peri-Wound Care Topical Primary Dressing Santyl Ointment Discharge Instruction: Apply nickel thick amount to wound bed as instructed Secondary Dressing Woven Gauze Sponge, Non-Sterile 4x4 in Discharge Instruction: moisten with saline and place over santyl ComfortFoam Border, 4x4 in (silicone border) Discharge Instruction: Apply over primary dressing as directed. Secured With Compression Wrap Compression Stockings Facilities manager) Signed: 01/26/2021 5:44:43 PM By: Stacey Abts RN, BSN Signed: 01/27/2021 4:58:23 PM By: [name] Entered By: [name] on 01/26/2021 15:58:27 -------------------------------------------------------------------------------- Wound Assessment Details Patient Name: Date of Service: [name], [name] 01/26/2021 9:45 A M Medical Record Number: [id number] Patient Account Number: [id number] Date of Birth/Sex: Treating RN: [birth year] (55 y.o. 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 Wound Open Wounding Event: Surgical Injury Status: Date Acquired: 10/11/2020 Comorbid Anemia, Sleep Apnea,  Hypotension, Type II Diabetes, Weeks Of Treatment: 2 History: Osteoarthritis, Neuropathy, Received Chemotherapy Clustered  Wound: No Photos Wound Measurements Length: (cm) 0.2 Width: (cm) 1.5 Depth: (cm) 0.7 Area: (cm) 0.236 Volume: (cm) 0.165 % Reduction in Area: 31.8% % Reduction in Volume: 40.2% Epithelialization: None Tunneling: No Undermining: No Wound Description Classification: Full Thickness Without Exposed Support Structu Wound Margin: Thickened Exudate Amount: Medium Exudate Type: Serosanguineous Exudate Color: red, brown res Foul Odor After Cleansing: No Slough/Fibrino No Wound Bed Granulation Amount: Large (67-100%) Exposed Structure Granulation Quality: Pink Fascia Exposed: No Necrotic Amount: None Present (0%) Fat Layer (Subcutaneous Tissue) Exposed: Yes Tendon Exposed: No Muscle Exposed: No Joint Exposed: No Bone Exposed: No Treatment Notes Wound #13 (Amputation Site - Transmetatarsal) Wound Laterality: Left Cleanser Peri-Wound Care Topical Primary Dressing Promogran Prisma Matrix, 4.34 (sq in) (silver collagen) Discharge Instruction: Moisten collagen with saline to base of wound Secondary Dressing Woven Gauze Sponge, Non-Sterile 4x4 in Discharge Instruction: Apply over primary dressing as directed. Woven Gauze Sponges 2x2 in Discharge Instruction: moisten with saline and pack into wound behind collagen Secured With [employer], 4.5x3.1 (in/yd) Discharge Instruction: Secure with Kerlix as directed. 59M Medipore H Soft Cloth Surgical T ape, 2x2 (in/yd) Discharge Instruction: Secure dressing with tape as directed. Compression Wrap Compression Stockings Add-Ons Electronic Signature(s) Signed: 01/26/2021 5:59:57 PM By: Stacey Deed RN, BSN Signed: 01/27/2021 4:58:23 PM By: [name] Entered By: [name] on 01/26/2021 15:58:05 -------------------------------------------------------------------------------- Wound Assessment Details Patient Name: Date of Service: [name], [name] 01/26/2021 9:45 A M Medical Record Number: [id number] Patient Account  Number: [id number] Date of Birth/Sex: Treating RN: [birth year] (55 y.o. Billy Coast, Linda Primary Care [place] Ciavarella: [name] Other Clinician: Referring Shuan Statzer: Treating Brandii Lakey/Extender: [name] in Treatment: 19 Wound Status Wound Number: 14 Primary Diabetic Wound/Ulcer of the Lower Extremity Etiology: Wound Location: Right T Second oe Wound Open Wounding Event: Trauma Status: Date Acquired: 01/17/2021 Comorbid Anemia, Sleep Apnea, Hypotension, Type II Diabetes, Weeks Of Treatment: 0 History: Osteoarthritis, Neuropathy, Received Chemotherapy Clustered Wound: No Photos Wound Measurements Length: (cm) 1.2 Width: (cm) 2 Depth: (cm) 0.1 Area: (cm) 1.885 Volume: (cm) 0.188 % Reduction in Area: 0% % Reduction in Volume: 0% Epithelialization: None Tunneling: No Undermining: No Wound Description Classification: Grade 1 Wound Margin: Flat and Intact Exudate Amount: Medium Exudate Type: Serosanguineous Exudate Color: red, brown Foul Odor After Cleansing: No Slough/Fibrino No Wound Bed Granulation Amount: Large (67-100%) Exposed Structure Granulation Quality: Pink Fascia Exposed: No Necrotic Amount: None Present (0%) Fat Layer (Subcutaneous Tissue) Exposed: Yes Tendon Exposed: No Muscle Exposed: No Joint Exposed: No Bone Exposed: No Treatment Notes Wound #14 (Toe Second) Wound Laterality: Right Cleanser Peri-Wound Care Topical Mupirocin Ointment Discharge Instruction: Apply Mupirocin (Bactroban) as instructed Primary Dressing Secondary Dressing Woven Gauze Sponges 2x2 in Discharge Instruction: Apply over primary dressing as directed. Secured With Conforming Stretch Gauze Bandage, Sterile 2x75 (in/in) Discharge Instruction: Secure with stretch gauze as directed. Compression Wrap Compression Stockings Add-Ons Electronic Signature(s) Signed: 01/26/2021 5:59:57 PM By: Stacey Deed RN, BSN Signed: 01/27/2021 4:58:23 PM By:  [name] Entered By: [name] on 01/26/2021 15:57:44 -------------------------------------------------------------------------------- Wound Assessment Details Patient Name: Date of Service: [name], [name] 01/26/2021 9:45 A M Medical Record Number: [id number] Patient Account Number: [id number] Date of Birth/Sex: Treating RN: 12/15/1965 (55 y.o. [name] Primary Care Bintou Lafata: [name] Other Clinician: Referring Barbie Croston: Treating Alexsis Kathman/Extender: [name] in Treatment: 19 Wound Status Wound Number: 15 Primary Skin Tear Etiology:  Wound Location: Right, Posterior Shoulder Wound Open Wounding Event: Gradually Appeared Status: Date Acquired: 01/25/2021 Comorbid Anemia, Sleep Apnea, Hypotension, Type II Diabetes, Weeks Of Treatment: 0 History: Osteoarthritis, Neuropathy, Received Chemotherapy Clustered Wound: No Photos Wound Measurements Length: (cm) 1.4 Width: (cm) 0.5 Depth: (cm) 0.1 Area: (cm) 0.55 Volume: (cm) 0.055 % Reduction in Area: 0% % Reduction in Volume: 0% Epithelialization: None Tunneling: No Undermining: No Wound Description Classification: Full Thickness Without Exposed Support Structures Wound Margin: Flat and Intact Exudate Amount: Medium Exudate Type: Serosanguineous Exudate Color: red, brown Foul Odor After Cleansing: No Slough/Fibrino No Wound Bed Granulation Amount: Large (67-100%) Exposed Structure Granulation Quality: Pink Fascia Exposed: No Necrotic Amount: None Present (0%) Fat Layer (Subcutaneous Tissue) Exposed: Yes Tendon Exposed: No Muscle Exposed: No Joint Exposed: No Bone Exposed: No Treatment Notes Wound #15 (Shoulder) Wound Laterality: Right, Posterior Cleanser Peri-Wound Care Topical Triamcinolone Discharge Instruction: Apply Triamcinolone as directed Primary Dressing Secondary Dressing Bordered Gauze, 2x2 in Discharge Instruction: Apply over primary dressing  as directed. Secured With Compression Wrap Compression Stockings Facilities managerAdd-Ons Electronic Signature(s) Signed: 01/26/2021 5:59:57 PM By: Stacey DeedBoehlein, Linda RN, BSN Signed: 01/27/2021 4:58:23 PM By: Stacey Itoawkins, Destiny Entered By: Stacey Itoawkins, Destiny on 01/26/2021 15:58:48 -------------------------------------------------------------------------------- Vitals Details Patient Name: Date of Service: Stacey FlashHA TLEY, [name] 01/26/2021 9:45 A M Medical Record Number: [id number] Patient Account Number: [id number] Date of Birth/Sex: Treating RN: 10-15-1966 (55 y.o. Stacey StandardF) Boehlein, Linda Primary Care Conny Moening: Stacey PalauAnderson, [name] Other Clinician: Referring Armstrong Creasy: Treating Ernesteen Mihalic/Extender: Stacey LimerickStone III, Hoyt Anderson, [name] [name]: 19 Vital Signs Time Taken: 09:55 Temperature (F): 98.5 Height (in): 66 Pulse (bpm): 80 Weight (lbs): 195 Respiratory Rate (breaths/min): 16 Body Mass Index (BMI): 31.5 Blood Pressure (mmHg): 119/78 Reference Range: 80 - 120 mg / dl Electronic Signature(s) Signed: 01/27/2021 4:58:23 PM By: Stacey Itoawkins, Destiny Entered By: Stacey Itoawkins, Destiny on 01/26/2021 09:56:07

## 2021-01-31 ENCOUNTER — Emergency Department (HOSPITAL_COMMUNITY): Payer: BC Managed Care – PPO

## 2021-01-31 ENCOUNTER — Emergency Department (HOSPITAL_COMMUNITY)
Admission: EM | Admit: 2021-01-31 | Discharge: 2021-01-31 | Disposition: A | Discharge status: Home / Self Care | Payer: BC Managed Care – PPO | Attending: Emergency Medicine | Admitting: Emergency Medicine

## 2021-01-31 ENCOUNTER — Encounter (HOSPITAL_COMMUNITY): Payer: Self-pay | Admitting: Emergency Medicine

## 2021-01-31 ENCOUNTER — Other Ambulatory Visit: Payer: Self-pay

## 2021-01-31 DIAGNOSIS — L089 Local infection of the skin and subcutaneous tissue, unspecified: Secondary | ICD-10-CM | POA: Diagnosis not present

## 2021-01-31 DIAGNOSIS — E039 Hypothyroidism, unspecified: Secondary | ICD-10-CM | POA: Diagnosis not present

## 2021-01-31 DIAGNOSIS — Z79899 Other long term (current) drug therapy: Secondary | ICD-10-CM | POA: Diagnosis not present

## 2021-01-31 DIAGNOSIS — Z89422 Acquired absence of other left toe(s): Secondary | ICD-10-CM | POA: Diagnosis not present

## 2021-01-31 DIAGNOSIS — I129 Hypertensive chronic kidney disease with stage 1 through stage 4 chronic kidney disease, or unspecified chronic kidney disease: Secondary | ICD-10-CM | POA: Diagnosis not present

## 2021-01-31 DIAGNOSIS — Z89421 Acquired absence of other right toe(s): Secondary | ICD-10-CM | POA: Diagnosis not present

## 2021-01-31 DIAGNOSIS — N183 Chronic kidney disease, stage 3 unspecified: Secondary | ICD-10-CM | POA: Insufficient documentation

## 2021-01-31 DIAGNOSIS — Z8616 Personal history of COVID-19: Secondary | ICD-10-CM | POA: Diagnosis not present

## 2021-01-31 DIAGNOSIS — E1122 Type 2 diabetes mellitus with diabetic chronic kidney disease: Secondary | ICD-10-CM | POA: Insufficient documentation

## 2021-01-31 DIAGNOSIS — M79674 Pain in right toe(s): Secondary | ICD-10-CM | POA: Diagnosis present

## 2021-01-31 LAB — BASIC METABOLIC PANEL
Anion gap: 10 (ref 5–15)
BUN: 18 mg/dL (ref 6–20)
CO2: 29 mmol/L (ref 22–32)
Calcium: 9 mg/dL (ref 8.9–10.3)
Chloride: 102 mmol/L (ref 98–111)
Creatinine, Ser: 1.05 mg/dL — ABNORMAL HIGH (ref 0.44–1.00)
GFR, Estimated: >60 mL/min (ref >60)
Glucose, Bld: 93 mg/dL (ref 70–99)
Potassium: 3.3 mmol/L — ABNORMAL LOW (ref 3.5–5.1)
Sodium: 141 mmol/L (ref 135–145)

## 2021-01-31 LAB — CBC WITH DIFFERENTIAL/PLATELET
Basophils Absolute: 0 10*3/uL (ref 0.0–0.1)
Basophils Relative: 0 %
Eosinophils Absolute: 0.6 10*3/uL — ABNORMAL HIGH (ref 0.0–0.5)
Eosinophils Relative: 5 %
HCT: 34.6 % — ABNORMAL LOW (ref 36.0–46.0)
Hemoglobin: 10.3 g/dL — ABNORMAL LOW (ref 12.0–15.0)
Lymphocytes Relative: 44 %
Lymphs Abs: 5.5 10*3/uL — ABNORMAL HIGH (ref 0.7–4.0)
MCH: 26.6 pg (ref 26.0–34.0)
MCHC: 29.8 g/dL — ABNORMAL LOW (ref 30.0–36.0)
MCV: 89.4 fL (ref 80.0–100.0)
Monocytes Absolute: 0.5 10*3/uL (ref 0.1–1.0)
Monocytes Relative: 4 %
Neutro Abs: 5.9 10*3/uL (ref 1.7–7.7)
Neutrophils Relative %: 47 %
Platelets: 369 10*3/uL (ref 150–400)
RBC: 3.87 MIL/uL (ref 3.87–5.11)
RDW: 18.2 % — ABNORMAL HIGH (ref 11.5–15.5)
WBC: 12.5 10*3/uL — ABNORMAL HIGH (ref 4.0–10.5)
nRBC: 0 % (ref 0.0–0.2)

## 2021-01-31 MED ORDER — CLINDAMYCIN PHOSPHATE 600 MG/50ML IV SOLN
600.0000 mg | Freq: Once | INTRAVENOUS | Status: AC
  Administered 2021-01-31: 600 mg via INTRAVENOUS
  Filled 2021-01-31: qty 50

## 2021-01-31 MED ORDER — HYDROCODONE-ACETAMINOPHEN 7.5-325 MG PO TABS
1.0000 | ORAL_TABLET | Freq: Once | ORAL | Status: AC
Start: 2021-01-31 — End: 2021-01-31
  Administered 2021-01-31: 1 via ORAL

## 2021-01-31 MED ORDER — CLINDAMYCIN HCL 150 MG PO CAPS: 450.0000 mg | ORAL_CAPSULE | Freq: Three times a day (TID) | ORAL | 0 refills | Status: AC

## 2021-01-31 MED ORDER — MORPHINE SULFATE (PF) 4 MG/ML IV SOLN: 4.0000 mg | Freq: Once | INTRAVENOUS | Status: DC

## 2021-01-31 NOTE — ED Triage Notes (Signed)
Pt states 2 weeks ago she hit her foot on a scale and became red and swollen last Wednesday, she states she seen her wound care dr and was given antibiotics and was told if it gets worse to come to ED to get IV antibiotics.

## 2021-01-31 NOTE — Discharge Instructions (Addendum)
You have been seen and discharged from the emergency department.  Take antibiotic as prescribed for toe infection.  Follow-up with your primary provider for reevaluation and further care. Take home medications as prescribed. If you have any worsening symptoms, high fevers, worsening swelling/redness of the foot or further concerns for health please return to an emergency department for further evaluation.

## 2021-01-31 NOTE — ED Provider Notes (Signed)
[facility] EMERGENCY DEPARTMENT Provider Note   CSN: [id number] Arrival date & time: 01/31/21  8675     History Chief Complaint  Patient presents with  . Foot Pain    [name] is a 55 y.o. female.  HPI   55 year old female with past medical history of DM, hypothyroidism, peripheral neuropathy with osteomyelitis status post toe amputations who currently follows at wound care presents with concern for right toe pain.  Patient states a little over week ago she stubbed her right second toe when walking.  Wound care has been monitoring the area, they mention if the toe gets more painful or red that she would need to go to the ER to be evaluated for infection/antibiotics.  Patient states when she woke up this morning that the toe seemed more swollen, red with some redness up the foot.  She has some raw skin at the tip of the toe but denies any other wound, states that the toenail fell off a couple days ago.  She denies any fevers.  Past Medical History:  Diagnosis Date  . Chronic cough   . Chronic pain   . Diabetes mellitus without complication (HCC)   . Dyspnea   . GERD (gastroesophageal reflux disease)   . Hypokalemia   . Hypothyroidism   . Neurocardiogenic syncope   . OSA (obstructive sleep apnea)    does not use CPAP  . Osteomyelitis (HCC)    bilateral feet  . Peripheral neuropathy   . Presence of intrathecal pump    recieves Prialt/bupivicaine  . Wegener's granulomatosis 2009  . Wegner's disease (congenital syphilitic osteochondritis)     Patient Active Problem List   Diagnosis Date Noted  . COVID-19 virus infection 11/01/2020  . Diarrhea 11/01/2020  . Dehydration 11/01/2020  . Generalized abdominal pain 10/09/2018  . Midline low back pain 09/11/2018  . Malfunction of intrathecal infusion pump 09/11/2018  . 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 Steroid-induced hyperglycemia 05/11/2018  . Weakness 05/02/2018  . Morbid obesity (HCC) 01/25/2018  .  Neuropathy 01/25/2018  . Presence of intrathecal pump 08/02/2017  . Rectocele 03/09/2017  . Urinary hesitancy 03/09/2017  . Abnormal finding on imaging 12/19/2016  . Esophageal dysphagia 12/19/2016  . Current chronic use of systemic steroids 10/26/2016  . IFG (impaired fasting glucose) 10/26/2016  . Arthralgia of multiple joints 07/15/2016  . Headache 07/15/2016  . Maculopapular rash, generalized 07/15/2016  . Peripheral neuropathy 07/04/2016  . Rash 07/04/2016  . CKD (chronic kidney disease) stage 3, GFR 30-59 ml/min (HCC) 07/04/2016  . 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 Neuropathic pain of both feet 01/11/2016  . OSA (obstructive sleep apnea) 12/29/2015  . Chronic insomnia 12/29/2015  . Mixed hyperlipidemia 12/29/2015  . Obesity, Class II, BMI 35-39.9, with comorbidity 12/29/2015  . Polyneuropathy associated with underlying disease (HCC) 12/29/2015  . 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) 12/29/2015  . Class 2 drug-induced obesity with serious comorbidity and body mass index (BMI) of 35.0 to 35.9 in adult 12/29/2015  . Other mechanical complication of implanted electronic neurostimulator of spinal cord electrode (lead), initial encounter (HCC) 08/04/2015  . Adjustment disorder with depressed mood 03/29/2015  . Hyperlipidemia 07/20/2014  . Mononeuritis 07/20/2014  . Obesity (BMI 30-39.9) 07/20/2014  . Essential hypertension 07/20/2014  . 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 Neurocardiogenic syncope 07/06/2014  . Chronic anemia 06/08/2014  . POTS (postural orthostatic tachycardia syndrome) 06/08/2014  . Tachycardia 06/08/2014  . Hypotension 06/03/2014  . Anal fissure 09/16/2013  .  Adrenal insufficiency (HCC) 05/30/2013  . Encounter for long-term (current) use of high-risk medication 05/30/2013  . Fall 05/30/2013  . Hemorrhage of rectum and anus 05/30/2013  . Hypokalemia 05/30/2013  . Leukocytosis 05/30/2013  .  Other long term (current) drug therapy 05/30/2013  . Syncope and collapse 05/30/2013  . 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/30/2013  . 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/2013  . Wegner's disease (congenital syphilitic osteochondritis)   . Gastroesophageal reflux disease without esophagitis 03/07/2012  . Other diseases of trachea and bronchus 08/30/2011  . 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 Atrophy of nasal turbinates 07/25/2011  . Nasal septal perforation 07/25/2011  . Cough 03/24/2011  . 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  . 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 Sleep apnea 03/24/2011    Past Surgical History:  Procedure Laterality Date  . AMPUTATION Right 10/21/2020   Procedure: Right hallux amputation;  Surgeon: Toni Arthurs, MD;  Location: [facility];  Service: Orthopedics;  Laterality: Right;  . AMPUTATION TOE Bilateral 08/12/2020   Procedure: Right 3rd toe amputation; left hallux and 3rd toe amputation;  Surgeon: Toni Arthurs, MD;  Location: [facility];  Service: Orthopedics;  Laterality: Bilateral;   . APPENDECTOMY    . BACK SURGERY    . CHOLECYSTECTOMY    . COLONOSCOPY N/A 06/02/2013   Procedure: COLONOSCOPY;  Surgeon: Graylin Shiver, MD;  Location: [facility];  Service: Endoscopy;  Laterality: N/A;  . HERNIA REPAIR  2000  . INCISION AND DRAINAGE ABSCESS Left 07/07/2016   Procedure: DEBRIDMENT LEFT THIGH ABSCESS, EXISION ACUTE SKIN RASH LEFT THIGH(1CM LESION);  Surgeon: Claud Kelp, MD;  Location: [facility];  Service: General;  Laterality: Left;  . SEPTOPLASTY  02/2013  . spleen anuyism    . splenic aneurysm    . TRANSMETATARSAL AMPUTATION Bilateral 10/21/2020   Procedure: Left transmetatarsal amputation;  Surgeon: Toni Arthurs, MD;  Location: [facility];  Service: Orthopedics;  Laterality: Bilateral;  . VAGINAL HYSTERECTOMY       OB History   No obstetric history on file.     Family History   Problem Relation Age of Onset  . Diabetes Mother   . Heart disease Mother   . Diabetes Father     Social History   Tobacco Use  . Smoking status: Never Smoker  . Smokeless tobacco: Never Used  Vaping Use  . Vaping Use: Never used  Substance Use Topics  . Alcohol use: Yes    Comment: occasionally  . Drug use: No    Home Medications Prior to Admission medications   Medication Sig Start Date End Date Taking? Authorizing Provider  acetaminophen (TYLENOL) 325 MG tablet Take 2 tablets (650 mg total) by mouth every 6 (six) hours as needed for mild pain (or Fever >/= 101). 07/12/16  Yes Vann, Jessica U, DO  albuterol (PROVENTIL HFA;VENTOLIN HFA) 108 (90 Base) MCG/ACT inhaler Inhale 2 puffs into the lungs every 6 (six) hours as needed for wheezing or shortness of breath.   Yes [provider]  b complex vitamins tablet Take 1 tablet by mouth daily.   Yes [provider]  Calcium Carb-Cholecalciferol (CALCIUM CARBONATE-VITAMIN D3 PO) Take 1 tablet by mouth daily.   Yes [provider]  DULoxetine (CYMBALTA) 60 MG capsule Take 60 mg by mouth 2 (two) times daily.   Yes [provider]  famotidine (PEPCID) 20 MG tablet Take 20 mg daily 01/12/20  Yes [place], Peter M, MD  ferrous sulfate 325 (65 FE)  MG tablet Take 325 mg by mouth daily with breakfast.   Yes [provider]  fluticasone (FLONASE) 50 MCG/ACT nasal spray Place 2 sprays into both nostrils daily as needed for rhinitis.   Yes [provider]  folic acid (FOLVITE) 1 MG tablet Take 2 mg by mouth daily.   Yes [provider]  furosemide (LASIX) 40 MG tablet Take 1 tablet (40 mg total) by mouth as directed. 09/06/20  Yes [place], Peter M, MD  gabapentin (NEURONTIN) 300 MG capsule Take 600-900 mg by mouth See admin instructions. Take 3 capsules (900 mg total) by mouth in the morning, 2 capsules (600 mg total) in the early afternoon, and 4 capsules (1200 mg total) in the evening.    Yes [provider]  lidocaine (LIDODERM) 5 % Place 1 patch onto the skin daily as needed (for pain). Remove & Discard patch within 12 hours or as directed by MD   Yes [provider]  magnesium oxide (MAG-OX) 400 MG tablet Take 400 mg by mouth 2 (two) times daily.   Yes [provider]  montelukast (SINGULAIR) 10 MG tablet Take 10 mg by mouth daily. 12/30/17  Yes [provider]  Potassium Chloride Crys ER (K-DUR PO) Take 40 mEq by mouth 3 (three) times daily.   Yes [provider]  tapentadol (NUCYNTA) 50 MG tablet Take 50 mg by mouth 2 (two) times daily.   Yes [provider]  tizanidine (ZANAFLEX) 2 MG capsule Take 2 mg by mouth 3 (three) times daily as needed for muscle spasms. 05/10/17  Yes [provider]  topiramate (TOPAMAX) 100 MG tablet Take 100 mg by mouth daily. 01/14/18  Yes [provider]  traZODone (DESYREL) 100 MG tablet Take 1 tablet (100 mg total) by mouth at bedtime. 01/12/20  Yes [place], Peter M, MD  vitamin B-12 (CYANOCOBALAMIN) 1000 MCG tablet Take 1,000 mcg by mouth daily.   Yes [provider]  Vitamin D, Ergocalciferol, (DRISDOL) 50000 units CAPS capsule Take 50,000 Units by mouth every 7 (seven) days. Pt takes on Friday.   Yes [provider]  ZICONOTIDE ACETATE IT by Intrathecal route every 3 (three) months. Unknown dose   Yes [provider]  metFORMIN (GLUCOPHAGE) 500 MG tablet Take 500 mg by mouth 2 (two) times daily with a meal. Patient not taking: Reported on 01/31/2021    [provider]  methotrexate 50 MG/2ML injection Inject 1 mL into the skin once a week.     [provider]  predniSONE (DELTASONE) 5 MG tablet Take 7.5 mg by mouth daily. 12/25/20   [provider]    Allergies    Ibuprofen and Penicillins  Review of Systems   Review of Systems  Constitutional: Negative for chills and fever.  HENT: Negative for congestion.   Eyes:  Negative for visual disturbance.  Respiratory: Negative for shortness of breath.   Cardiovascular: Negative for chest pain.  Gastrointestinal: Negative for abdominal pain, diarrhea and vomiting.  Genitourinary: Negative for dysuria.  Musculoskeletal:       Right second toe injury/redness/swelling/pain  Skin: Negative for rash.  Neurological: Negative for headaches.    Physical Exam Updated Vital Signs BP 118/80 (BP Location: Right Arm)   Pulse 67   Temp 98.4 F (36.9 C) (Oral)   Resp 18   Ht 5\' 6"  (1.676 m)   Wt 86.2 kg   SpO2 99%   BMI 30.67 kg/m   Physical Exam Vitals and nursing note reviewed.  Constitutional:      Appearance: Normal appearance.  HENT:     Head: Normocephalic.     Mouth/Throat:     Mouth: Mucous membranes are moist.  Cardiovascular:     Rate and Rhythm: Normal rate.  Pulmonary:     Effort: Pulmonary effort is normal. No respiratory distress.  Abdominal:     Palpations: Abdomen is soft.     Tenderness: There is no abdominal tenderness.  Musculoskeletal:     Comments: Multiple toe amputations, the right second toe is erythematous, slightly swollen, tender to touch, there is minimal redness past the toe on the dorsum of the foot, no tracking cellulitis/fluctuance/induration, raw skin of the tip of the right second toe with mild bleeding  Skin:    General: Skin is warm.  Neurological:     Mental Status: She is alert and oriented to person, place, and time. Mental status is at baseline.  Psychiatric:        Mood and Affect: Mood normal.     ED Results / Procedures / Treatments   Labs (all labs ordered are listed, but only abnormal results are displayed) Labs Reviewed  CBC WITH DIFFERENTIAL/PLATELET  BASIC METABOLIC PANEL    EKG None  Radiology DG Toe 3rd Right  Result Date: 01/31/2021 CLINICAL DATA:  Right third toe pain after injury 2 weeks ago. EXAM: RIGHT THIRD TOE COMPARISON:  None. FINDINGS: Most of the first toe and all of the third  toe have been surgically amputated. No fracture or dislocation is seen involving the second toe. Remaining joint spaces are intact. IMPRESSION: Status post surgical amputation of the first and third toes. No acute abnormality is seen involving the second toe. Electronically Signed   By: Lupita RaiderJames  Green Jr M.D.   On: 01/31/2021 10:30    Procedures Procedures   Medications Ordered in ED Medications  morphine 4 MG/ML injection 4 mg (has no administration in time range)    ED Course  I have reviewed the triage vital signs and the nursing notes.  Pertinent labs & imaging results that were available during my care of the patient were reviewed by me and considered in my medical decision making (see chart for details).    MDM Rules/Calculators/A&P                          55 year old female presents the emergency department with right second toe redness/infection.  She is afebrile on arrival.  Blood work shows a mild leukocytosis, x-ray shows no fracture or signs of osteomyelitis.  No signs of Sirs/sepsis.  Patient has history of osteomyelitis in the past with multiple toe rotations, she is diabetic and high risk in terms of infection.  For this reason patient was given an IV dose of antibiotics and will be placed on oral antibiotics.  The toe has been appropriately dressed.  Given strict return to ED precautions in regards to cellulitis/tracking infection.  Patient will be discharged and treated as an outpatient.  Discharge plan and strict return to ED precautions discussed, patient verbalizes understanding and agreement.  Final Clinical Impression(s) / ED Diagnoses Final diagnoses:  None    Rx / DC Orders ED Discharge Orders    None       Rozelle LoganHorton, Haydn Cush M, DO 01/31/21 1516

## 2021-01-31 NOTE — ED Notes (Signed)
MD at bedside. 

## 2021-01-31 NOTE — ED Notes (Signed)
Introduced self to pt. Pt resting comfortably on stretcher, RR even and nonlabored. VS updated. Bandage to right toe dry and intact. Pt denies any additional needs at this time. Stretcher low and locked, call bell in reach.

## 2021-02-02 ENCOUNTER — Other Ambulatory Visit: Payer: Self-pay

## 2021-02-02 ENCOUNTER — Encounter (HOSPITAL_BASED_OUTPATIENT_CLINIC_OR_DEPARTMENT_OTHER): Payer: BC Managed Care – PPO | Admitting: Physician Assistant

## 2021-02-02 DIAGNOSIS — E11621 Type 2 diabetes mellitus with foot ulcer: Secondary | ICD-10-CM | POA: Diagnosis not present

## 2021-02-02 NOTE — Progress Notes (Addendum)
MADDI, COLLAR ([id number]) Visit Report for 02/02/2021 Chief Complaint Document Details Patient Name: Date of Service: [name], BASARA 02/02/2021 10:15 A M Medical Record Number: [id number] Patient Account Number: [id number] Date of Birth/Sex: Treating RN: 1966/07/11 (55 y.o. Elam Dutch Primary Care Provider: Anastasia Pall Other Clinician: Referring Provider: Treating Provider/Extender: Harriette Bouillon in Treatment: 20 Information Obtained from: Patient Chief Complaint Bilateral LE Ulcers Electronic Signature(s) Signed: 02/02/2021 11:02:19 AM By: Worthy Keeler PA-C Entered By: Worthy Keeler on 02/02/2021 11:02:18 -------------------------------------------------------------------------------- HPI Details Patient Name: Date of Service: [name], GOTSCHALL 02/02/2021 10:15 A M Medical Record Number: [id number] Patient Account Number: [id number] Date of Birth/Sex: Treating RN: 1966/05/17 (55 y.o. Elam Dutch Primary Care Provider: Anastasia Pall Other Clinician: Referring Provider: Treating Provider/Extender: Harriette Bouillon in Treatment: 20 History of Present Illness HPI Description: 09/15/2020 upon evaluation today patient appears to be doing somewhat poorly in regard to the wounds on her feet. She has a wound on the right lateral foot, right medial great toe, right dorsal great toe, left lateral foot, and right lateral lower leg. Currently based on what we are seeing the patient states that the worst issue is her right great toe. She dates that she wishes Dr. Doran Durand had actually gone ahead and performed amputation at this site as well when he did the other toes they have all healed well that is the right third toe, left great toe, and left third toe. All are completely healed and did excellent. Currently she has been transition into Cam boots for both feet previously she was using postop shoes but apparently this caused some  rubbing on the lateral aspects of both feet and at the medial aspect of her right great toe which unfortunately has caused her a lot of discomfort and issues. She has been given mupirocin and betamethasone for some of the small wounds that occur over her legs and arms frequently. 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 Otherwise she also does have diabetes mellitus type 2. Currently the patient is on methotrexate and also undergoes chemotherapy infusions every 6 months for the Wegener's granulomatosis currently her amputations were asked on August 12, 2020 that was with Dr. Doran Durand. His physician assistant Larkin Ina referred her to me today. 09/22/2020 upon evaluation today patient appears to be making good progress with the Santyl at this point. I am extremely pleased with where things stand today and I do believe him to be able to debride some of the left lateral foot wound which we have not been able to do as the eschar was too thick. I think this is doing much better currently. 09/29/2020 upon evaluation today patient appears to be doing decently well in regard to her ulcers on her feet. I do believe the Santyl has been of benefit. With that being said I am concerned about the fact that she continues to have wounds over her arms and legs bilaterally which seem to continually be an issue. Fortunately there is no signs of active infection at this time but again I am not really sure where these are coming from. She is seeing dermatology they had suggested this was pyoderma but that seems kind of unusual based on how they seem to come up. She has been using a mix of steroid and mupirocin over these areas. 12/17; this is a patient called in  urgently to be seen. She was apparently in the shower washing the wound on the left lateral fifth metatarsal head and noticed a hole present. She  also complained of increasing pain.No systemic signs or symptoms. 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 She says her wound started from friction with Cam boots that she had after amputation of three toes two on the left and one on the right 10/13/2020 upon evaluation today patient appears to be doing really about the same in regard to her feet. She is scheduled for amputation surgery on the 30th of this month December 2021. 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 With that being said she does seem somewhat a little down about everything going on obviously she was hopeful to try to get the wound is healed things just have not progressed as we were hoping. Fortunately there is no signs of active infection at this time. No fevers, chills, nausea, vomiting, or diarrhea. 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 11/24/2020 patient presents for reevaluation here in our clinic after having had surgery with Dr. Doran Durand on 10/21/2020. 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 The patient also had percutaneous left Achilles tendon lengthening. Subsequently she has done fairly well with regard to these surgeries. The right great toe has healed nicely the left transmetatarsal amputation has mostly healed although there is a small area that looks like it may be attempting to dehisce. With that being said Dr. Doran Durand still has the sutures in place I did not do anything with this wound today though the patient wanted me to look at it. I explained that I do think Dr. Doran Durand needs to remove the sutures when he thinks that is appropriate and subsequently if we need to help with trying to get it healed if  there is a small dehiscence I will be more than happy to aid in that regard. All this was discussed with the patient during the office visit today. 01/12/2021 upon evaluation today patient appears to be doing actually fairly well considering the last time I saw her things were much more significant. Fortunately there does not appear to be any evidence of infection at any site. Mainly the open areas that she has right now is a burn which is 1/3 degree burn on her right thigh which occurred as a result of a curling iron injury. This is more recent and just the past several days. Fortunately there does not appear to be any evidence of infection right now which is great news. I do believe we need to try to help clean this up I think Santyl would do quite well. She does tell me that she needs to have treatment for her Wegener's granulomatosis. They have been holding off on that however due to the fact of the concerned about her healing. At this point I think if she needs to have the treatment then from a rheumatology standpoint if they feel that is necessary than she would just have to proceed with that and will have to do what we can as far as helping with healing is concerned. 01/19/2021 on evaluation today patient appears to be doing decently well in regard to her wounds.  The Santyl is loosening up the necrotic tissue on the surface of the burn on her thigh. Fortunately that does not appear to be infected. 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 01/26/2021 upon evaluation today patient appears to be doing well in regard to her amputation site on the left. This is a left foot transmetatarsal region. With that being said unfortunately her toe on the right foot is showing signs of being more erythematous and swollen today. I think that we can probably need to put her on a stronger oral  antibiotic she has been taking Bactrim 3 times a week but obviously that is not doing the job. 02/02/2021 upon evaluation today patient appears to be doing well with regard to her wounds in general. I feel like everything is actually showing signs of some good improvement. She did go to the ER on Monday due to her toe she received IV antibiotics and then they switched her to clindamycin from the doxycycline that had her own she has not picked that up yet that should be today. Nonetheless I think that is fine for her to switch over to clindamycin I see no problems in that regard. With that being said the patient continues to experience some discomfort in regard to the toe but think it looks nearly as bad as what it did previous. Electronic Signature(s) Signed: 02/02/2021 1:06:41 PM By: Worthy Keeler PA-C Entered By: Worthy Keeler on 02/02/2021 13:06:41 -------------------------------------------------------------------------------- Dressings and/or debridement of burns; small Details Patient Name: Date of Service: LEINANI, [name] 02/02/2021 10:15 A M Medical Record Number: [id number] Patient Account Number: [id number] Date of Birth/Sex: Treating RN: [birth year] (55 y.o. Elam Dutch Primary Care Provider: Anastasia Pall Other Clinician: Referring Provider: Treating Provider/Extender: Harriette Bouillon in Treatment: 20 Procedure Performed for: Wound #12 Right Upper Leg Performed By: Physician Worthy Keeler, PA Post Procedure Diagnosis Same as Pre-procedure Notes santyl Electronic Signature(s) Signed: 02/02/2021 5:03:59 PM By: Worthy Keeler PA-C Signed: 02/02/2021 5:38:32 PM By: Baruch Gouty RN, BSN Entered By: Baruch Gouty on 02/02/2021 11:38:35 -------------------------------------------------------------------------------- Physical Exam Details Patient Name: Date of Service: [name], LOTTMAN 02/02/2021 10:15 A M Medical Record Number: [id number] Patient  Account Number: [id number] Date of Birth/Sex: Treating RN: [birth year] (55 y.o. Elam Dutch Primary Care Provider: Anastasia Pall Other Clinician: Referring Provider: Treating Provider/Extender: Harriette Bouillon in Treatment: 35 Constitutional Well-nourished and well-hydrated in no acute distress. Respiratory normal breathing without difficulty. Psychiatric this patient is able to make decisions and demonstrates good insight into disease process. Alert and Oriented x 3. pleasant and cooperative. Notes Upon inspection patient's wound bed actually showed signs of pretty good granulation epithelization across the board her toe on the right foot which is showing signs of erythema where she had the nail that was coming off has completely come off at this point and subsequently the redness though still there is not really seem to drain at this point which is great. Electronic Signature(s) Signed: 02/02/2021 1:07:03 PM By: Worthy Keeler PA-C Entered By: Worthy Keeler on 02/02/2021 13:07:03 -------------------------------------------------------------------------------- Physician Orders Details Patient Name: Date of Service: LAILANIE, HASLEY 02/02/2021 10:15 A M Medical Record Number: [id number] Patient Account Number: [id number] Date of Birth/Sex: Treating RN: Aug 05, 1966 (55 y.o. Elam Dutch Primary Care Provider: Anastasia Pall Other Clinician: Referring Provider:  Treating Provider/Extender: Harriette Bouillon in Treatment: 20 Verbal / Phone Orders: No Diagnosis Coding ICD-10 Coding Code Description E11.621 Type 2 diabetes mellitus with foot ulcer T24.311A Burn of third degree of right thigh, initial encounter L97.512 Non-pressure chronic ulcer of other part of right foot with fat layer exposed L97.812 Non-pressure chronic ulcer of other part of right lower leg with fat layer exposed L97.122 Non-pressure chronic ulcer of left  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 L97.822 Non-pressure chronic ulcer of other part of left lower leg with fat layer exposed 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 Bathing/ Shower/ Hygiene May shower and wash wound with soap and water. - shower with hibiclens soap weekly Non Wound Condition Other Non Wound Condition Orders/Instructions: - go to emergency room if right toe and foot redness worsens, fever, chills or increased pain Home Health New wound care orders this week; continue Home Health for wound care. May utilize formulary equivalent dressing for wound treatment orders unless otherwise specified. - silver alginate to right 2nd toe Other Home Health Orders/Instructions: - [facility] Wound Treatment Wound #12 - Upper Leg Wound Laterality: Right Prim Dressing: Santyl Ointment 1 x Per Day/30 Days ary Discharge Instructions: Apply nickel thick amount to wound bed as instructed Secondary Dressing: Woven Gauze Sponge, Non-Sterile 4x4 in (Generic) 1 x Per Day/30 Days Discharge Instructions: moisten with saline and place over santyl Secondary Dressing: ComfortFoam Border, 4x4 in (silicone border) (Generic) 1 x Per Day/30 Days Discharge Instructions: Apply over primary dressing as directed. Wound #13 - Amputation Site - Transmetatarsal Wound Laterality: Left Prim Dressing: Hydrofera Blue Classic Foam, 2x2 in (DME) (Generic) 3 x Per Week/30 Days ary Discharge Instructions: Moisten with saline prior to applying to wound bed, Tuck into tunnel at 9 o'clock Secondary Dressing: Woven Gauze Sponge, Non-Sterile 4x4 in (Generic) 3 x Per Week/30 Days Discharge Instructions: Apply over primary dressing as directed. Secondary Dressing: Woven Gauze Sponges 2x2 in (Generic) 3 x Per Week/30 Days Discharge Instructions: moisten with saline and pack into wound behind collagen Secured With: Coban  Self-Adherent Wrap 4x5 (in/yd) (DME) (Generic) 3 x Per Week/30 Days Discharge Instructions: Secure with Coban over kerlix. Secured With: [employer], 4.5x3.1 (in/yd) (Generic) 3 x Per Week/30 Days Discharge Instructions: Secure with Kerlix as directed. Secured With: 67M Medipore H Soft Cloth Surgical Tape, 2x2 (in/yd) (DME) (Generic) 3 x Per Week/30 Days Discharge Instructions: Secure dressing with tape as directed. Wound #14 - T Second oe Wound Laterality: Right Prim Dressing: KerraCel Ag Gelling Fiber Dressing, 2x2 in (silver alginate) (DME) (Generic) 1 x Per Day/15 Days ary Discharge Instructions: Apply silver alginate to wound bed as instructed Secondary Dressing: Woven Gauze Sponges 2x2 in (Generic) 1 x Per Day/15 Days Discharge Instructions: Apply over primary dressing as directed. Secured With: Child psychotherapist, Sterile 2x75 (in/in) (Generic) 1 x Per Day/15 Days Discharge Instructions: Secure with stretch gauze as directed. Wound #15 - Shoulder Wound Laterality: Right, Posterior Topical: Mupirocin Ointment 1 x Per Day/15 Days Discharge Instructions: Apply Mupirocin (Bactroban) mixed with TCA cream Topical: Triamcinolone 1 x Per Day/15 Days Discharge Instructions: Apply Triamcinolone as directed Secondary Dressing: Bordered Gauze, 2x2 in (Generic) 1 x Per PNT/61 Days Discharge Instructions: Apply over primary dressing as directed. [employer] Flow Cytometry Due to Concern for Circulating Plasma Cells - Flow Cytometry Due to Concern for Circulating Plasma Cells recommended by Dr. Farris Has in the pathology lab. Requires this  further evaluation. - (ICD10 M31.31 - Wegener's granulomatosis with renal involvement) Electronic Signature(s) Signed: 02/03/2021 1:00:11 PM By: Worthy Keeler PA-C Signed: 02/03/2021 1:41:00 PM By: Baruch Gouty RN, BSN Previous Signature: 02/02/2021 5:03:59 PM Version By: Worthy Keeler PA-C Entered By: Baruch Gouty on 02/03/2021  12:53:25 Prescription 02/02/2021 -------------------------------------------------------------------------------- Evalina Field PA Patient Name: Provider: 1966-08-10 [id number] Date of Birth: NPI#[id number] Primus Sex: DEA #: [phone] Phone #: License #: [place] Patient Address: [address] [place], [place] [address] [place], [place] [address] [phone] Allergies ibuprofen; penicillin Provider's Orders Flow Cytometry Due to Concern for Circulating Plasma Cells - ICD10: M31.31 - Flow Cytometry Due to Concern for Circulating Plasma Cells recommended by Dr. Farris Has in the pathology lab. Requires this further evaluation. Hand Signature: Date(s): Electronic Signature(s) Signed: 02/03/2021 1:00:11 PM By: Worthy Keeler PA-C Signed: 02/03/2021 1:41:00 PM By: Baruch Gouty RN, BSN Previous Signature: 02/02/2021 5:03:59 PM Version By: Worthy Keeler PA-C Entered By: Baruch Gouty on 02/03/2021 12:53:26 -------------------------------------------------------------------------------- Problem List Details Patient Name: Date of Service: [name], [name] 02/02/2021 10:15 A M Medical Record Number: [id number] Patient Account Number: [id number] Date of Birth/Sex: Treating RN: 1966/07/13 (55 y.o. Elam Dutch Primary Care Provider: Anastasia Pall Other Clinician: Referring Provider: Treating Provider/Extender: Harriette Bouillon in Treatment: 20 Active Problems ICD-10 Encounter Code Description Active Date MDM Diagnosis E11.621 Type 2 diabetes mellitus with foot ulcer 09/15/2020 No Yes T24.311A Burn of third degree of right thigh, initial encounter 01/12/2021 No Yes L97.512 Non-pressure chronic ulcer of other part of right foot with fat layer exposed 09/15/2020 No Yes L97.812 Non-pressure chronic ulcer of other part of right lower leg with fat layer 09/15/2020 No  Yes exposed L97.122 Non-pressure chronic ulcer of left 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 10/08/2020 No Yes L97.822 Non-pressure chronic ulcer of other part of left lower leg with fat layer exposed2/11/2020 No Yes M31.31 Wegener's granulomatosis with renal involvement 09/15/2020 No Yes N18.9 Chronic kidney disease, unspecified 09/15/2020 No Yes Inactive Problems Resolved Problems Electronic Signature(s) Signed: 02/02/2021 11:02:11 AM By: Worthy Keeler PA-C Entered By: Worthy Keeler on 02/02/2021 11:02:10 -------------------------------------------------------------------------------- Progress Note Details Patient Name: Date of Service: [name], [name] 02/02/2021 10:15 A M Medical Record Number: [id number] Patient Account Number: [id number] Date of Birth/Sex: Treating RN: 08-12-1966 (55 y.o. Elam Dutch Primary Care Provider: Anastasia Pall Other Clinician: Referring Provider: Treating Provider/Extender: Harriette Bouillon in Treatment: 20 Subjective Chief Complaint Information obtained from Patient Bilateral LE Ulcers History of Present Illness (HPI) 09/15/2020 upon evaluation today patient appears to be doing somewhat poorly in regard to the wounds on her feet. She has a wound on the right lateral foot, right medial great toe, right dorsal great toe, left lateral foot, and right lateral lower leg. Currently based on what we are seeing the patient states that the worst issue is her right great toe. She dates that she wishes Dr. Doran Durand had actually gone ahead and performed amputation at this site as well when he did the other toes they have all healed well that is the right third toe, left great toe, and left third toe. All are completely healed and did excellent. Currently she has been transition into Cam boots for both feet previously she was using postop shoes but apparently this caused some rubbing on the lateral aspects of both feet and at the medial aspect of  her right great toe which unfortunately has caused her a lot of discomfort and issues. She has been given mupirocin and betamethasone for some of the small wounds that occur over her legs and arms frequently. 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 Otherwise she also does have diabetes mellitus type 2. Currently the patient is on methotrexate and also undergoes chemotherapy infusions every 6 months for the Wegener's granulomatosis currently her amputations were asked on August 12, 2020 that was with Dr. Doran Durand. His physician assistant Larkin Ina referred her to me today. 09/22/2020 upon evaluation today patient appears to be making good progress with the Santyl at this point. I am extremely pleased with where things stand today and I do believe him to be able to debride some of the left lateral foot wound which we have not been able to do as the eschar was too thick. I think this is doing much better currently. 09/29/2020 upon evaluation today patient appears to be doing decently well in regard to her ulcers on her feet. I do believe the Santyl has been of benefit. With that being said I am concerned about the fact that she continues to have wounds over her arms and legs bilaterally which seem to continually be an issue. Fortunately there is no signs of active infection at this time but again I am not really sure where these are coming from. She is seeing dermatology they had suggested this was pyoderma but that seems kind of unusual based on how they seem to come up. She has been using a mix of steroid and mupirocin over these areas. 12/17; this is a patient called in urgently to be seen. She was apparently in the shower washing the wound on the left lateral fifth metatarsal head and noticed a hole present. She also complained of increasing pain.No systemic signs or symptoms. The  patient is a steroid-induced diabetic on Metformin she does not have an arterial issue. She says her wound started from friction with Cam boots that she had after amputation of three toes two on the left and one on the right 10/13/2020 upon evaluation today patient appears to be doing really about the same in regard to her feet. She is scheduled for amputation surgery on the 30th of this month December 2021. 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 With that being said she does seem somewhat a little down about everything going on obviously she was hopeful to try to get the wound is healed things just have not progressed as we were hoping. Fortunately there is no signs of active infection at this time. No fevers, chills, nausea, vomiting, or diarrhea. 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 11/24/2020 patient presents for reevaluation here in our clinic after having had surgery with Dr. Doran Durand on 10/21/2020. 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 The patient also had percutaneous left Achilles tendon lengthening. Subsequently she has done fairly well with regard to these surgeries. The right great toe has healed nicely the left transmetatarsal amputation has mostly healed although there is a small area that looks like it may be attempting to dehisce. With that being  said Dr. Doran Durand still has the sutures in place I did not do anything with this wound today though the patient wanted me to look at it. I explained that I do think Dr. Doran Durand needs to remove the sutures when he thinks that is appropriate and subsequently if we need to help with trying to get it healed if there is a small dehiscence I will be more than happy to aid in that  regard. All this was discussed with the patient during the office visit today. 01/12/2021 upon evaluation today patient appears to be doing actually fairly well considering the last time I saw her things were much more significant. Fortunately there does not appear to be any evidence of infection at any site. Mainly the open areas that she has right now is a burn which is 1/3 degree burn on her right thigh which occurred as a result of a curling iron injury. This is more recent and just the past several days. Fortunately there does not appear to be any evidence of infection right now which is great news. I do believe we need to try to help clean this up I think Santyl would do quite well. She does tell me that she needs to have treatment for her Wegener's granulomatosis. They have been holding off on that however due to the fact of the concerned about her healing. At this point I think if she needs to have the treatment then from a rheumatology standpoint if they feel that is necessary than she would just have to proceed with that and will have to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

## 2021-02-03 LAB — PATHOLOGIST SMEAR REVIEW

## 2021-02-03 NOTE — Progress Notes (Signed)
JAYE, SAAL ([id number]) Visit Report for 02/02/2021 Arrival Information Details Patient Name: Date of Service: SLOKA, VOLANTE 02/02/2021 10:15 A M Medical Record Number: [id number] Patient Account Number: [id number] Date of Birth/Sex: Treating RN: 08-25-1966 (55 y.o. [name] Primary Care Kirbi Farrugia: Antony Haste Other Clinician: Referring Manjot Hinks: Treating Jaquon Gingerich/Extender: Duanne Limerick in Treatment: 20 Visit Information History Since Last Visit Added or deleted any medications: No Patient Arrived: Wheel Chair Any new allergies or adverse reactions: No Arrival Time: 10:49 Had a fall or experienced change in No Accompanied By: father activities of daily living that may affect Transfer Assistance: None risk of falls: Patient Identification Verified: Yes Signs or symptoms of abuse/neglect since last visito No Secondary Verification Process Completed: Yes Hospitalized since last visit: No Patient Requires Transmission-Based Precautions: No Implantable device outside of the clinic excluding No Patient Has Alerts: No cellular tissue based products placed in the center since last visit: Has Dressing in Place as Prescribed: Yes Pain Present Now: Yes Electronic Signature(s) Signed: 02/02/2021 5:13:29 PM By: Zandra Abts RN, BSN Entered By: Zandra Abts on 02/02/2021 10:49:48 -------------------------------------------------------------------------------- Encounter Discharge Information Details Patient Name: Date of Service: [name], Malajah 02/02/2021 10:15 A M Medical Record Number: [id number] Patient Account Number: [id number] Date of Birth/Sex: Treating RN: 1965-10-29 (55 y.o. [name], [name] Primary Care Cassandria Drew: Antony Haste Other Clinician: Referring Shenoa Hattabaugh: Treating Ambar Raphael/Extender: Duanne Limerick in Treatment: 20 Encounter Discharge Information Items Discharge Condition: Stable Ambulatory Status:  Wheelchair Discharge Destination: Home Transportation: Private Auto Accompanied By: self Schedule Follow-up Appointment: Yes Clinical Summary of Care: Patient Declined Electronic Signature(s) Signed: 02/03/2021 5:45:48 PM By: Fonnie Mu RN Entered By: Fonnie Mu on 02/02/2021 13:47:57 -------------------------------------------------------------------------------- Lower Extremity Assessment Details Patient Name: Date of Service: TALLIA, MOEHRING 02/02/2021 10:15 A M Medical Record Number: [id number] Patient Account Number: [id number] Date of Birth/Sex: Treating RN: June 25, 1966 (55 y.o. [name] Primary Care Andriea Hasegawa: Antony Haste Other Clinician: Referring [place] Mckeough: Treating Faisal Stradling/Extender: Duanne Limerick in Treatment: 20 Edema Assessment Assessed: Kyra Searles: No] Franne Forts: No] Edema: [Left: No] [Right: No] Calf Left: Right: Point of Measurement: 30 cm From Medial Instep 35 cm 36 cm Ankle Left: Right: Point of Measurement: 9 cm From Medial Instep 22 cm 22.5 cm Vascular Assessment Pulses: Dorsalis Pedis Palpable: [Left:Yes] [Right:Yes] Electronic Signature(s) Signed: 02/02/2021 5:13:29 PM By: Zandra Abts RN, BSN Entered By: Zandra Abts on 02/02/2021 10:59:12 -------------------------------------------------------------------------------- Multi-Disciplinary Care Plan Details Patient Name: Date of Service: TANISA, LAGACE 02/02/2021 10:15 A M Medical Record Number: [id number] Patient Account Number: [id number] Date of Birth/Sex: Treating RN: 1966-10-02 (55 y.o. [name] Primary Care Gatsby Chismar: Antony Haste Other Clinician: Referring Shady Bradish: Treating Dereonna Lensing/Extender: Duanne Limerick in Treatment: 20 Multidisciplinary Care Plan reviewed with physician Active Inactive Wound/Skin Impairment Nursing Diagnoses: Impaired tissue integrity Knowledge deficit related to ulceration/compromised  skin integrity Goals: Patient/caregiver will verbalize understanding of skin care regimen Date Initiated: 09/15/2020 Target Resolution Date: 02/09/2021 Goal Status: Active Ulcer/skin breakdown will have a volume reduction of 30% by week 4 Date Initiated: 09/15/2020 Date Inactivated: 10/13/2020 Target Resolution Date: 10/13/2020 Goal Status: Unmet Unmet Reason: infection Interventions: Assess patient/caregiver ability to obtain necessary supplies Assess patient/caregiver ability to perform ulcer/skin care regimen upon admission and as needed Assess ulceration(s) every visit Provide education on ulcer and skin care Treatment Activities: Skin care regimen initiated : 09/15/2020 Topical wound management initiated : 09/15/2020 Notes: Electronic Signature(s) Signed: 02/02/2021 5:38:32 PM By: Zenaida Deed  RN, BSN Entered By: Zenaida DeedBoehlein, Linda on 02/02/2021 11:31:41 -------------------------------------------------------------------------------- Pain Assessment Details Patient Name: Date of Service: Glendon AxeHA TLEY, Dyana 02/02/2021 10:15 A M Medical Record Number: [id number] Patient Account Number: [id number] Date of Birth/Sex: Treating RN: November 03, 1965 (55 y.o. Wynelle LinkF) Lynch, Shatara Primary Care Kaydence Baba: Antony HasteBadger, Michael Other Clinician: Referring Aneisha Skyles: Treating Medhansh Brinkmeier/Extender: Duanne LimerickStone III, Hoyt Anderson, [name] [name]: 20 Active Problems Location of Pain Severity and Description of Pain Patient Has Paino Yes Site Locations Pain Location: Generalized Pain Duration of the Pain. Constant / Intermittento Constant Rate the pain. Current Pain Level: 5 Character of Pain Describe the Pain: Throbbing Pain Management and Medication Current Pain Management: Medication: Yes Cold Application: No Rest: No Massage: No Activity: No T.E.N.S.: No Heat Application: No Leg drop or elevation: No Is the Current Pain Management Adequate: Adequate How does your wound impact your  activities of daily livingo Sleep: No Bathing: No Appetite: No Relationship With Others: No Bladder Continence: No Emotions: No Bowel Continence: No Work: No Toileting: No Drive: No Dressing: No Hobbies: No Electronic Signature(s) Signed: 02/02/2021 5:13:29 PM By: Zandra AbtsLynch, Shatara RN, BSN Entered By: Zandra AbtsLynch, Shatara on 02/02/2021 10:50:59 -------------------------------------------------------------------------------- Patient/Caregiver Education Details Patient Name: Date of Service: Cheryl FlashHA TLEY, Dennie 4/13/2022andnbsp10:15 A M Medical Record Number: [id number] Patient Account Number: [id number] Date of Birth/Gender: Treating RN: November 03, 1965 (55 y.o. Tommye StandardF) Boehlein, Linda Primary Care Physician: Antony HasteBadger, Michael Other Clinician: Referring Physician: Treating Physician/Extender: Duanne LimerickStone III, Hoyt Anderson, [name] [name]: 20 Education Assessment Education Provided To: Patient Education Topics Provided Tissue Oxygenation: Methods: Explain/Verbal Responses: Reinforcements needed, State content correctly Wound/Skin Impairment: Methods: Explain/Verbal Responses: Reinforcements needed, State content correctly Electronic Signature(s) Signed: 02/02/2021 5:38:32 PM By: Zenaida DeedBoehlein, Linda RN, BSN Entered By: Zenaida DeedBoehlein, Linda on 02/02/2021 11:32:08 -------------------------------------------------------------------------------- Wound Assessment Details Patient Name: Date of Service: Glendon AxeHA TLEY, Jini 02/02/2021 10:15 A M Medical Record Number: [id number] Patient Account Number: [id number] Date of Birth/Sex: Treating RN: November 03, 1965 (55 y.o. 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 Etiology: Wound Location: Right Upper Leg Wound Open Wounding Event: Thermal Burn Status: Date Acquired:  12/29/2020 Comorbid Anemia, Sleep Apnea, Hypotension, Type II Diabetes, Weeks Of Treatment: 3 History: Osteoarthritis, Neuropathy, Received Chemotherapy Clustered Wound: No Photos Wound Measurements Length: (cm) 2.4 Width: (cm) 4.2 Depth: (cm) 0.1 Area: (cm) 7.917 Volume: (cm) 0.792 % Reduction in Area: 36.4% % Reduction in Volume: 68.2% Epithelialization: Small (1-33%) Tunneling: No Undermining: No Wound Description Classification: Full Thickness Without Exposed Support Structures Wound Margin: Distinct, outline attached Exudate Amount: Medium Exudate Type: Serous Exudate Color: amber Foul Odor After Cleansing: No Slough/Fibrino Yes Wound Bed Granulation Amount: Small (1-33%) Exposed Structure Granulation Quality: Red Fascia Exposed: No Necrotic Amount: Large (67-100%) Fat Layer (Subcutaneous Tissue) Exposed: Yes Necrotic Quality: Adherent Slough Tendon Exposed: No Muscle Exposed: No Joint Exposed: No Bone Exposed: No Treatment Notes Wound #12 (Upper Leg) Wound Laterality: Right Cleanser Peri-Wound Care Topical Primary Dressing Santyl Ointment Discharge Instruction: Apply nickel thick amount to wound bed as instructed Secondary Dressing Woven Gauze Sponge, Non-Sterile 4x4 in Discharge Instruction: moisten with saline and place over santyl ComfortFoam Border, 4x4 in (silicone border) Discharge Instruction: Apply over primary dressing as directed. Secured With Compression Wrap Compression Stockings Facilities managerAdd-Ons Electronic Signature(s) Signed: 02/02/2021 4:36:07 PM By: Karl Itoawkins, Destiny Signed: 02/02/2021 5:13:29 PM By: Zandra AbtsLynch, Shatara RN, BSN Entered By: Karl Itoawkins, Destiny on 02/02/2021 16:01:12 -------------------------------------------------------------------------------- Wound Assessment Details  Patient Name: Date of Service: CISSY, GALBREATH 02/02/2021 10:15 A M Medical Record Number: [id number] Patient Account Number: [id number] Date of Birth/Sex: Treating  RN: [birth year] (55 y.o. 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 Wound Open Wounding Event: Surgical Injury Status: Date Acquired: 10/11/2020 Comorbid Anemia, Sleep Apnea, Hypotension, Type II Diabetes, Weeks Of Treatment: 3 History: Osteoarthritis, Neuropathy, Received Chemotherapy Clustered Wound: No Photos Wound Measurements Length: (cm) 0.2 Width: (cm) 1.1 Depth: (cm) 0.7 Area: (cm) 0.173 Volume: (cm) 0.121 % Reduction in Area: 50% % Reduction in Volume: 56.2% Epithelialization: None Tunneling: No Undermining: Yes Starting Position (o'clock): 12 Ending Position (o'clock): 12 Maximum Distance: (cm) 0.4 Wound Description Classification: Full Thickness Without Exposed Support Structures Wound Margin: Thickened Exudate Amount: Medium Exudate Type: Serosanguineous Exudate Color: red, brown Foul Odor After Cleansing: No Slough/Fibrino No Wound Bed Granulation Amount: Large (67-100%) Exposed Structure Granulation Quality: Pink Fascia Exposed: No Necrotic Amount: None Present (0%) Fat Layer (Subcutaneous Tissue) Exposed: Yes Tendon Exposed: No Muscle Exposed: No Joint Exposed: No Bone Exposed: No Treatment Notes Wound #13 (Amputation Site - Transmetatarsal) Wound Laterality: Left Cleanser Peri-Wound Care Topical Primary Dressing Hydrofera Blue Classic Foam, 2x2 in Discharge Instruction: Moisten with saline prior to applying to wound bed, Tuck into tunnel at 9 o'clock Secondary Dressing Woven Gauze Sponge, Non-Sterile 4x4 in Discharge Instruction: Apply over primary dressing as directed. Woven Gauze Sponges 2x2 in Discharge Instruction: moisten with saline and pack into wound behind collagen Secured  With Coban Self-Adherent Wrap 4x5 (in/yd) Discharge Instruction: Secure with Coban over kerlix. Kerlix Roll Sterile, 4.5x3.1 (in/yd) Discharge Instruction: Secure with Kerlix as directed. 78M Medipore H Soft Cloth Surgical T ape, 2x2 (in/yd) Discharge Instruction: Secure dressing with tape as directed. Compression Wrap Compression Stockings Add-Ons Electronic Signature(s) Signed: 02/02/2021 4:36:07 PM By: Karl Ito Signed: 02/02/2021 5:13:29 PM By: Zandra Abts RN, BSN Entered By: Karl Ito on 02/02/2021 16:01:57 -------------------------------------------------------------------------------- Wound Assessment Details Patient Name: Date of Service: KERIANNE, GURR 02/02/2021 10:15 A M Medical Record Number: [id number] Patient Account Number: [id number] Date of Birth/Sex: Treating RN: [birth year] (55 y.o. [name] Primary Care Hurshell Dino: Antony Haste Other Clinician: Referring Nikolas Casher: Treating Nickola Lenig/Extender: Duanne Limerick in Treatment: 20 Wound Status Wound Number: 14 Primary Diabetic Wound/Ulcer of the Lower Extremity Etiology: Wound Location: Right T Second oe Wound Open Wounding Event: Trauma Status: Date Acquired: 01/17/2021 Comorbid Anemia, Sleep Apnea, Hypotension, Type II Diabetes, Weeks Of Treatment: 1 History: Osteoarthritis, Neuropathy, Received Chemotherapy Clustered Wound: No Photos Wound Measurements Length: (cm) 1.5 Width: (cm) 2.5 Depth: (cm) 0.1 Area: (cm) 2.945 Volume: (cm) 0.295 Wound Description Classification: Grade 1 Wound Margin: Flat and Intact Exudate Amount: Medium Exudate Type: Serosanguineous Exudate Color: red, brown Foul Odor After Cleansing: Slough/Fibrino % Reduction in Area: -56.2% % Reduction in Volume: -56.9% Epithelialization: None Tunneling: No Undermining: No No No Wound Bed Granulation Amount: Large (67-100%) Exposed Structure Granulation Quality: Pink Fascia  Exposed: No Necrotic Amount: None Present (0%) Fat Layer (Subcutaneous Tissue) Exposed: Yes Tendon Exposed: No Muscle Exposed: No Joint Exposed: No Bone Exposed: No Treatment Notes Wound #14 (Toe Second) Wound Laterality: Right Cleanser Peri-Wound Care Topical Primary Dressing KerraCel Ag Gelling Fiber Dressing, 2x2 in (silver alginate) Discharge Instruction: Apply silver alginate to wound bed as instructed Secondary Dressing Woven Gauze Sponges 2x2  in Discharge Instruction: Apply over primary dressing as directed. Secured With Conforming Stretch Gauze Bandage, Sterile 2x75 (in/in) Discharge Instruction: Secure with stretch gauze as directed. Compression Wrap Compression Stockings Add-Ons Electronic Signature(s) Signed: 02/02/2021 4:36:07 PM By: Karl Ito Signed: 02/02/2021 5:13:29 PM By: Zandra Abts RN, BSN Entered By: Karl Ito on 02/02/2021 16:01:35 -------------------------------------------------------------------------------- Wound Assessment Details Patient Name: Date of Service: NABILA, ALBARRACIN 02/02/2021 10:15 A M Medical Record Number: [id number] Patient Account Number: [id number] Date of Birth/Sex: Treating RN: May 30, 1966 (55 y.o. [name] Primary Care Courtnei Ruddell: Antony Haste Other Clinician: Referring Chanele Douglas: Treating Cha Gomillion/Extender: Duanne Limerick in Treatment: 20 Wound Status Wound Number: 15 Primary Skin Tear Etiology: Wound Location: Right, Posterior Shoulder Wound Open Wounding Event: Gradually Appeared Status: Date Acquired: 01/25/2021 Comorbid Anemia, Sleep Apnea, Hypotension, Type II Diabetes, Weeks Of Treatment: 1 History: Osteoarthritis, Neuropathy, Received Chemotherapy Clustered Wound: No Photos Wound Measurements Length: (cm) 1.3 Width: (cm) 0.5 Depth: (cm) 0.1 Area: (cm) 0.511 Volume: (cm) 0.051 % Reduction in Area: 7.1% % Reduction in Volume: 7.3% Epithelialization: Small  (1-33%) Tunneling: No Undermining: No Wound Description Classification: Full Thickness Without Exposed Support Structures Wound Margin: Flat and Intact Exudate Amount: Medium Exudate Type: Serosanguineous Exudate Color: red, brown Foul Odor After Cleansing: No Slough/Fibrino No Wound Bed Granulation Amount: Large (67-100%) Exposed Structure Granulation Quality: Red, Pink Fascia Exposed: No Necrotic Amount: None Present (0%) Fat Layer (Subcutaneous Tissue) Exposed: Yes Tendon Exposed: No Muscle Exposed: No Joint Exposed: No Bone Exposed: No Treatment Notes Wound #15 (Shoulder) Wound Laterality: Right, Posterior Cleanser Peri-Wound Care Topical Mupirocin Ointment Discharge Instruction: Apply Mupirocin (Bactroban) mixed with TCA cream Triamcinolone Discharge Instruction: Apply Triamcinolone as directed Primary Dressing Secondary Dressing Bordered Gauze, 2x2 in Discharge Instruction: Apply over primary dressing as directed. Secured With Compression Wrap Compression Stockings Facilities manager) Signed: 02/02/2021 4:36:07 PM By: Karl Ito Signed: 02/02/2021 5:13:29 PM By: Zandra Abts RN, BSN Entered By: Karl Ito on 02/02/2021 16:00:48 -------------------------------------------------------------------------------- Vitals Details Patient Name: Date of Service: [name], Sharayah 02/02/2021 10:15 A M Medical Record Number: [id number] Patient Account Number: [id number] Date of Birth/Sex: Treating RN: 1965-12-30 (55 y.o. [name] Primary Care Rosangela Fehrenbach: Antony Haste Other Clinician: Referring Laquia Rosano: Treating Gustave Lindeman/Extender: Duanne Limerick in Treatment: 20 Vital Signs Time Taken: 10:50 Temperature (F): 98.4 Height (in): 66 Pulse (bpm): 71 Weight (lbs): 195 Respiratory Rate (breaths/min): 16 Body Mass Index (BMI): 31.5 Blood Pressure (mmHg): 107/73 Reference Range: 80 - 120 mg / dl Electronic  Signature(s) Signed: 02/02/2021 5:13:29 PM By: Zandra Abts RN, BSN Entered By: Zandra Abts on 02/02/2021 10:53:40

## 2021-02-09 ENCOUNTER — Other Ambulatory Visit (HOSPITAL_COMMUNITY)
Admission: RE | Admit: 2021-02-09 | Discharge: 2021-02-09 | Disposition: A | Discharge status: Home / Self Care | Payer: BC Managed Care – PPO | Source: Other Acute Inpatient Hospital | Attending: Physician Assistant | Admitting: Physician Assistant

## 2021-02-09 ENCOUNTER — Encounter (HOSPITAL_BASED_OUTPATIENT_CLINIC_OR_DEPARTMENT_OTHER): Payer: BC Managed Care – PPO | Admitting: Internal Medicine

## 2021-02-09 ENCOUNTER — Other Ambulatory Visit: Payer: Self-pay

## 2021-02-09 DIAGNOSIS — L97822 Non-pressure chronic ulcer of other part of left lower leg with fat layer exposed: Secondary | ICD-10-CM

## 2021-02-09 DIAGNOSIS — L97512 Non-pressure chronic ulcer of other part of right foot with fat layer exposed: Secondary | ICD-10-CM | POA: Diagnosis not present

## 2021-02-09 DIAGNOSIS — M3131 Wegener's granulomatosis with renal involvement: Secondary | ICD-10-CM | POA: Diagnosis present

## 2021-02-09 DIAGNOSIS — D72822 Plasmacytosis: Secondary | ICD-10-CM | POA: Diagnosis not present

## 2021-02-09 DIAGNOSIS — E11621 Type 2 diabetes mellitus with foot ulcer: Secondary | ICD-10-CM | POA: Diagnosis not present

## 2021-02-09 DIAGNOSIS — T24311A Burn of third degree of right thigh, initial encounter: Secondary | ICD-10-CM

## 2021-02-09 LAB — GLUCOSE, CAPILLARY: Glucose-Capillary: 104 mg/dL — ABNORMAL HIGH (ref 70–99)

## 2021-02-09 NOTE — Progress Notes (Signed)
[name], [name] ([id number]) Visit Report for 02/09/2021 Chief Complaint Document Details Patient Name: Date of Service: [name], [name] 02/09/2021 12:45 PM Medical Record Number: [id number] Patient Account Number: [id number] Date of Birth/Sex: Treating RN: 02/20/1966 (55 y.o. [name] Primary Care Provider: Antony Haste Other Clinician: Referring Provider: Treating Provider/Extender: Avelina Laine in Treatment: 21 Information Obtained from: Patient Chief Complaint Bilateral LE Ulcers Electronic Signature(s) Signed: 02/09/2021 4:58:19 PM By: Geralyn Corwin DO Entered By: Geralyn Corwin on 02/09/2021 14:02:53 -------------------------------------------------------------------------------- Debridement Details Patient Name: Date of Service: [name], [name] 02/09/2021 12:45 PM Medical Record Number: [id number] Patient Account Number: [id number] Date of Birth/Sex: Treating RN: Nov 13, 1965 (55 y.o. [name] Primary Care Provider: Antony Haste Other Clinician: Referring Provider: Treating Provider/Extender: Avelina Laine in Treatment: 21 Debridement Performed for Assessment: Wound #13 Left Amputation Site - Transmetatarsal Performed By: Physician Geralyn Corwin, DO Debridement Type: Debridement Level of Consciousness (Pre-procedure): Awake and Alert Pre-procedure Verification/Time Out Yes - 13:15 Taken: Start Time: 13:25 Pain Control: Other : benzocaine 20% spray T Area Debrided (L x W): otal 0.7 (cm) x 1.4 (cm) = 0.98 (cm) Tissue and other material debrided: Viable, Non-Viable, Callus, Slough, Subcutaneous, Skin: Epidermis, Slough Level: Skin/Subcutaneous Tissue Debridement Description: Excisional Instrument: Curette Bleeding: Minimum Hemostasis Achieved: Pressure End Time: 13:32 Procedural Pain: 0 Post Procedural Pain: 0 Response to Treatment: Procedure was tolerated well Level of Consciousness (Post-  Awake and Alert procedure): Post Debridement Measurements of Total Wound Length: (cm) 0.7 Width: (cm) 1.4 Depth: (cm) 0.7 Volume: (cm) 0.539 Character of Wound/Ulcer Post Debridement: Improved Post Procedure Diagnosis Same as Pre-procedure Electronic Signature(s) Signed: 02/09/2021 4:58:19 PM By: Geralyn Corwin DO Signed: 02/09/2021 6:20:01 PM By: Zenaida Deed RN, BSN Entered By: Zenaida Deed on 02/09/2021 13:33:39 -------------------------------------------------------------------------------- HPI Details Patient Name: Date of Service: [name], [name] 02/09/2021 12:45 PM Medical Record Number: [id number] Patient Account Number: [id number] Date of Birth/Sex: Treating RN: [birth year] (55 y.o. [name] Primary Care Provider: Antony Haste Other Clinician: Referring Provider: Treating Provider/Extender: Avelina Laine in Treatment: 21 History of Present Illness HPI Description: 09/15/2020 upon evaluation today patient appears to be doing somewhat poorly in regard to the wounds on her feet. She has a wound on the right lateral foot, right medial great toe, right dorsal great toe, left lateral foot, and right lateral lower leg. Currently based on what we are seeing the patient states that the worst issue is her right great toe. She dates that she wishes Dr. Victorino Dike had actually gone ahead and performed amputation at this site as well when he did the other toes they have all healed well that is the right third toe, left great toe, and left third toe. All are completely healed and did excellent. Currently she has been transition into Cam boots for both feet previously she was using postop shoes but apparently this caused some rubbing on the lateral aspects of both feet and at the medial aspect of her right great toe which unfortunately has caused her a lot of discomfort and issues. She has been given mupirocin and betamethasone for some of the small  wounds that occur over her legs and arms frequently. 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 Otherwise she also does have diabetes mellitus type 2. Currently the patient is on methotrexate and also undergoes chemotherapy infusions every 6 months for the [facility]  granulomatosis currently her amputations were asked on August 12, 2020 that was with Dr. Victorino Dike. His physician assistant Jill Alexanders referred her to me today. 09/22/2020 upon evaluation today patient appears to be making good progress with the Santyl at this point. I am extremely pleased with where things stand today and I do believe him to be able to debride some of the left lateral foot wound which we have not been able to do as the eschar was too thick. I think this is doing much better currently. 09/29/2020 upon evaluation today patient appears to be doing decently well in regard to her ulcers on her feet. I do believe the Santyl has been of benefit. With that being said I am concerned about the fact that she continues to have wounds over her arms and legs bilaterally which seem to continually be an issue. Fortunately there is no signs of active infection at this time but again I am not really sure where these are coming from. She is seeing dermatology they had suggested this was pyoderma but that seems kind of unusual based on how they seem to come up. She has been using a mix of steroid and mupirocin over these areas. 12/17; this is a patient called in urgently to be seen. She was apparently in the shower washing the wound on the left lateral fifth metatarsal head and noticed a hole present. She also complained of increasing pain.No systemic signs or symptoms. 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 She says her wound started from friction with Cam boots that she had  after amputation of three toes two on the left and one on the right 10/13/2020 upon evaluation today patient appears to be doing really about the same in regard to her feet. She is scheduled for amputation surgery on the 30th of this month December 2021. 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 With that being said she does seem somewhat a little down about everything going on obviously she was hopeful to try to get the wound is healed things just have not progressed as we were hoping. Fortunately there is no signs of active infection at this time. No fevers, chills, nausea, vomiting, or diarrhea. 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 11/24/2020 patient presents for reevaluation here in our clinic after having had surgery with Dr. Victorino Dike on 10/21/2020. 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 The patient also had percutaneous left Achilles tendon lengthening. Subsequently she has done fairly well with regard to these surgeries. The right great toe has healed nicely the left transmetatarsal amputation has mostly healed although there is a small area that looks like it may be attempting to dehisce. With that being said Dr. Victorino Dike still has the sutures in place I did not do anything with this wound today though the patient wanted me to look at it. I explained that I do think Dr. Victorino Dike needs to remove the sutures when he thinks that is appropriate and subsequently if we need to help with trying to get it healed if there is a small dehiscence I will be more than happy to aid in that regard. All this was discussed with the patient during the office visit  today. 01/12/2021 upon evaluation today patient appears to be doing actually fairly well  considering the last time I saw her things were much more significant. Fortunately there does not appear to be any evidence of infection at any site. Mainly the open areas that she has right now is a burn which is 1/3 degree burn on her right thigh which occurred as a result of a curling iron injury. This is more recent and just the past several days. Fortunately there does not appear to be any evidence of infection right now which is great news. I do believe we need to try to help clean this up I think Santyl would do quite well. She does tell me that she needs to have treatment for her Wegener's granulomatosis. They have been holding off on that however due to the fact of the concerned about her healing. At this point I think if she needs to have the treatment then from a rheumatology standpoint if they feel that is necessary than she would just have to proceed with that and will have to do what we can as far as helping with healing is concerned. 01/19/2021 on evaluation today patient appears to be doing decently well in regard to her wounds. The Santyl is loosening up the necrotic tissue on the surface of the burn on her thigh. Fortunately that does not appear to be infected. 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 01/26/2021 upon evaluation today patient appears to be doing well in regard to her amputation site on the left. This is a left foot transmetatarsal region. With that being said unfortunately her toe on the right foot is showing signs of being more erythematous and swollen today. I think that we can probably need to put her on a stronger oral antibiotic she has been taking Bactrim 3 times a week but obviously that is not doing the job. 02/02/2021 upon evaluation today patient appears to be doing well with regard to her wounds in general. I feel like everything is actually  showing signs of some good improvement. She did go to the ER on Monday due to her toe she received IV antibiotics and then they switched her to clindamycin from the doxycycline that had her own she has not picked that up yet that should be today. Nonetheless I think that is fine for her to switch over to clindamycin I see no problems in that regard. With that being said the patient continues to experience some discomfort in regard to the toe but think it looks nearly as bad as what it did previous. 02/09/2021 patient presents for her 1 week follow-up. She has been using Hydrofera Blue to her transmetatarsal wound dehiscence site. She has been using silver alginate to her right second toe and Santyl to her upper right thigh wound burn. She reports stability in her wound sites and has no questions or concerns today. Electronic Signature(s) Signed: 02/09/2021 4:58:19 PM By: Geralyn Corwin DO Entered By: Geralyn Corwin on 02/09/2021 14:03:44 -------------------------------------------------------------------------------- Dressings and/or debridement of burns; small Details Patient Name: Date of Service: [name], [name] 02/09/2021 12:45 PM Medical Record Number: [id number] Patient Account Number: [id number] Date of Birth/Sex: Treating RN: Sep 17, 1966 (55 y.o. [name] Primary Care Provider: Antony Haste Other Clinician: Referring Provider: Treating Provider/Extender: Avelina Laine in Treatment: 21 Procedure Performed for: Wound #12 Right  Upper Leg Performed By: Physician Geralyn Corwin, DO Post Procedure Diagnosis Same as Pre-procedure Notes using #3 curette and santyl Electronic Signature(s) Signed: 02/09/2021 4:58:19 PM By: Geralyn Corwin DO Signed: 02/09/2021 6:20:01 PM By: Zenaida Deed RN, BSN Entered By: Zenaida Deed on 02/09/2021 13:26:12 -------------------------------------------------------------------------------- Physical Exam  Details Patient Name: Date of Service: [name], [name] 02/09/2021 12:45 PM Medical Record Number: [id number] Patient Account Number: [id number] Date of Birth/Sex: Treating RN: 1966-09-02 (55 y.o. [name] Primary Care Provider: Antony Haste Other Clinician: Referring Provider: Treating Provider/Extender: Avelina Laine in Treatment: 21 Constitutional respirations regular, non-labored and within target range for patient.. Cardiovascular 2+ dorsalis pedis/posterior tibialis pulses. Psychiatric pleasant and cooperative. Notes Left transmetatarsal wound site: Granulation tissue present, callus surrounding wound bed. Right second toe: Erythematous although stable from previous clinic visits. No purulent drainage noted or increased warmth. Right upper thigh wound: Sloughing throughout with budding granulation tissue. No signs of infection to any wound Electronic Signature(s) Signed: 02/09/2021 4:58:19 PM By: Geralyn Corwin DO Entered By: Geralyn Corwin on 02/09/2021 14:05:15 -------------------------------------------------------------------------------- Physician Orders Details Patient Name: Date of Service: [name], [name] 02/09/2021 12:45 PM Medical Record Number: [id number] Patient Account Number: [id number] Date of Birth/Sex: Treating RN: [birth year] (55 y.o. [name] Primary Care Provider: Antony Haste Other Clinician: Referring Provider: Treating Provider/Extender: Avelina Laine in Treatment: 21 Verbal / Phone Orders: No Diagnosis Coding ICD-10 Coding Code Description E11.621 Type 2 diabetes mellitus with foot ulcer T24.311A Burn of third degree of right thigh, initial encounter L97.512 Non-pressure chronic ulcer of other part of right foot with fat layer exposed L97.812 Non-pressure chronic ulcer of other part of right lower leg with fat layer exposed L97.122 Non-pressure chronic ulcer of left thigh  with fat layer exposed L97.822 Non-pressure chronic ulcer of other part of left lower leg with fat layer exposed M31.31 Wegener's granulomatosis with renal involvement N18.9 Chronic kidney disease, unspecified Follow-up Appointments Return A ppointment in 1 week. Other: - get lab work done [employer] May shower and wash wound with soap and water. - shower with hibiclens soap weekly Non Wound Condition Other Non Wound Condition Orders/Instructions: - go to emergency room if right toe and foot redness worsens, fever, chills or increased pain Home Health No change in wound care orders this week; continue Home Health for wound care. May utilize formulary equivalent dressing for wound treatment orders unless otherwise specified. Other Home Health Orders/Instructions: - [facility] Electronic Signature(s) Signed: 02/09/2021 4:58:19 PM By: Geralyn Corwin DO Entered By: Geralyn Corwin on 02/09/2021 14:05:28 -------------------------------------------------------------------------------- Problem List Details Patient Name: Date of Service: [name], [name] 02/09/2021 12:45 PM Medical Record Number: [id number] Patient Account Number: [id number] Date of Birth/Sex: Treating RN: 07-13-1966 (55 y.o. [name] Primary Care Provider: Antony Haste Other Clinician: Referring Provider: Treating Provider/Extender: Avelina Laine in Treatment: 21 Active Problems ICD-10 Encounter Code Description Active Date MDM Diagnosis E11.621 Type 2 diabetes mellitus with foot ulcer 09/15/2020 No Yes T24.311A Burn of third degree of right thigh, initial encounter 01/12/2021 No Yes L97.512 Non-pressure chronic ulcer of other part of right foot with fat layer exposed 09/15/2020 No Yes L97.812 Non-pressure chronic ulcer of other part of right lower leg with fat layer 09/15/2020 No Yes exposed L97.122 Non-pressure chronic ulcer of left thigh with fat layer  exposed 10/08/2020 No Yes L97.822 Non-pressure chronic ulcer of other part of left lower leg with fat layer exposed2/11/2020 No Yes M31.31 Wegener's granulomatosis with  renal involvement 09/15/2020 No Yes N18.9 Chronic kidney disease, unspecified 09/15/2020 No Yes Inactive Problems Resolved Problems Electronic Signature(s) Signed: 02/09/2021 4:58:19 PM By: Geralyn Corwin DO Entered By: Geralyn Corwin on 02/09/2021 14:02:41 -------------------------------------------------------------------------------- Progress Note Details Patient Name: Date of Service: [name], [name] 02/09/2021 12:45 PM Medical Record Number: [id number] Patient Account Number: [id number] Date of Birth/Sex: Treating RN: 1966/06/07 (55 y.o. [name] Primary Care Provider: Antony Haste Other Clinician: Referring Provider: Treating Provider/Extender: Avelina Laine in Treatment: 21 Subjective Chief Complaint Information obtained from Patient Bilateral LE Ulcers History of Present Illness (HPI) 09/15/2020 upon evaluation today patient appears to be doing somewhat poorly in regard to the wounds on her feet. She has a wound on the right lateral foot, right medial great toe, right dorsal great toe, left lateral foot, and right lateral lower leg. Currently based on what we are seeing the patient states that the worst issue is her right great toe. She dates that she wishes Dr. Victorino Dike had actually gone ahead and performed amputation at this site as well when he did the other toes they have all healed well that is the right third toe, left great toe, and left third toe. All are completely healed and did excellent. Currently she has been transition into Cam boots for both feet previously she was using postop shoes but apparently this caused some rubbing on the lateral aspects of both feet and at the medial aspect of her right great toe which unfortunately has caused her a lot of discomfort  and issues. She has been given mupirocin and betamethasone for some of the small wounds that occur over her legs and arms frequently. 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 Otherwise she also does have diabetes mellitus type 2. Currently the patient is on methotrexate and also undergoes chemotherapy infusions every 6 months for the Wegener's granulomatosis currently her amputations were asked on August 12, 2020 that was with Dr. Victorino Dike. His physician assistant Jill Alexanders referred her to me today. 09/22/2020 upon evaluation today patient appears to be making good progress with the Santyl at this point. I am extremely pleased with where things stand today and I do believe him to be able to debride some of the left lateral foot wound which we have not been able to do as the eschar was too thick. I think this is doing much better currently. 09/29/2020 upon evaluation today patient appears to be doing decently well in regard to her ulcers on her feet. I do believe the Santyl has been of benefit. With that being said I am concerned about the fact that she continues to have wounds over her arms and legs bilaterally which seem to continually be an issue. Fortunately there is no signs of active infection at this time but again I am not really sure where these are coming from. She is seeing dermatology they had suggested this was pyoderma but that seems kind of unusual based on how they seem to come up. She has been using a mix of steroid and mupirocin over these areas. 12/17; this is a patient called in urgently to be seen. She was apparently in the shower washing the wound on the left lateral fifth metatarsal head and noticed a hole present. She also complained of increasing pain.No systemic signs or symptoms. 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 She says her wound started from friction with Cam boots that she had after amputation of three toes two on the left and one on the right 10/13/2020 upon evaluation today patient appears to be doing really about the same in regard to her feet. She is scheduled for amputation surgery on the 30th of this month December 2021. 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 With that being said she does seem somewhat a little down about everything going on obviously she was hopeful to try to get the wound is healed things just have not progressed as we were hoping. Fortunately there is no signs of active infection at this time. No fevers, chills, nausea, vomiting, or diarrhea. 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 11/24/2020 patient presents for reevaluation here in our clinic after having had surgery with Dr. Victorino Dike on 10/21/2020. 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 The patient also had percutaneous left Achilles tendon lengthening. Subsequently she has done fairly well with regard to these surgeries. The right great toe has healed nicely the left transmetatarsal amputation has mostly healed although there is a small area that looks like it may be attempting to dehisce. With that being said Dr. Victorino Dike still has the sutures in place I did not do anything with this wound today though the patient wanted me to look at it. I explained that I do think Dr. Victorino Dike needs to remove the sutures when he thinks that is appropriate and subsequently if we need to help with trying to get it healed if there is a small dehiscence I will be more than happy to aid in that regard. All this was discussed with the patient during the office visit  today. 01/12/2021 upon evaluation today patient appears to be doing actually fairly well considering the last time I saw her things were much more significant. Fortunately there does not appear to be any evidence of infection at any site. Mainly the open areas that she has right now is a burn which is 1/3 degree burn on her right thigh which occurred as a result of a curling iron injury. This is more recent and just the past several days. Fortunately there does not appear to be any evidence of infection right now which is great news. I do believe we need to try to help clean this up I think Santyl would do quite well. She does tell me that she needs to have treatment for her Wegener's granulomatosis. They have been holding off on that however due to the fact of the concerned about her healing. At this point I think if she needs to have the treatment then from a rheumatology standpoint if they feel that is necessary than she would just have to proceed with that and will have to do what we can as far as helping with healing is concerned. 01/19/2021 on evaluation today patient appears to be doing decently well in regard to her wounds. The Santyl is loosening up the necrotic tissue on the surface of the burn on her thigh. Fortunately that does not appear to be infected. In regard to the transmetatarsal amputation site this is healing nicely it still does have some depth but I feel like the collagen is  still the best way to go. 01/26/2021 upon evaluation today patient appears to be doing well in regard to her amputation site on the left. This is a left foot transmetatarsal region. With that being said unfortunately her toe on the right foot is showing signs of being more erythematous and swollen today. I think that we can probably need to put her on a stronger oral antibiotic she has been taking Bactrim 3 times a week but obviously that is not doing the job. 02/02/2021 upon evaluation today patient appears to be  doing well with regard to her wounds in general. I feel like everything is actually showing signs of some good improvement. She did go to the ER on Monday due to her toe she received IV antibiotics and then they switched her to clindamycin from the doxycycline that had her own she has not picked that up yet that should be today. Nonetheless I think that is fine for her to switch over to clindamycin I see no problems in that regard. With that being said the patient continues to experience some discomfort in regard to the toe but think it looks nearly as bad as what it did previous. 02/09/2021 patient presents for her 1 week follow-up. She has been using Hydrofera Blue to her transmetatarsal wound dehiscence site. She has been using silver alginate to her right second toe and Santyl to her upper right thigh wound burn. She reports stability in her wound sites and has no questions or concerns today. Patient History Information obtained from Patient. Family History Diabetes - Mother, Heart Disease - Mother, Kidney Disease - Father,Mother, No family history of Lung Disease, Seizures, Stroke, Thyroid Problems, Tuberculosis. Social History Never smoker, Marital Status - Married, Alcohol Use - Never, Drug Use - No History, Caffeine Use - Never. Medical History Hematologic/Lymphatic Patient has history of Anemia Respiratory Patient has history of Sleep Apnea Cardiovascular Patient has history of Hypotension Endocrine Patient has history of Type II Diabetes - steroid induced Musculoskeletal Patient has history of Osteoarthritis Neurologic Patient has history of Neuropathy Oncologic Patient has history of Received Chemotherapy Medical A Surgical History Notes nd Cardiovascular Wegener's granulomatosis Genitourinary CKD stage 3, Adrenal Insufficiency Musculoskeletal Osteochondritis Objective Constitutional respirations regular, non-labored and within target range for patient.. Vitals Time  Taken: 12:49 PM, Height: 66 in, Weight: 195 lbs, BMI: 31.5, Temperature: 98 F, Pulse: 69 bpm, Respiratory Rate: 17 breaths/min, Blood Pressure: 82/61 mmHg, Capillary Blood Glucose: 82 mg/dl. Cardiovascular 2+ dorsalis pedis/posterior tibialis pulses. Psychiatric pleasant and cooperative. General Notes: Left transmetatarsal wound site: Granulation tissue present, callus surrounding wound bed. Right second toe: Erythematous although stable from previous clinic visits. No purulent drainage noted or increased warmth. Right upper thigh wound: Sloughing throughout with budding granulation tissue. No signs of infection to any wound Integumentary (Hair, Skin) Wound #12 status is Open. Original cause of wound was Thermal Burn. The date acquired was: 12/29/2020. The wound has been in treatment 4 weeks. The wound is located on the Right Upper Leg. The wound measures 2.4cm length x 4.3cm width x 0.1cm depth; 8.105cm^2 area and 0.811cm^3 volume. There is Fat Layer (Subcutaneous Tissue) exposed. There is no tunneling or undermining noted. There is a medium amount of serous drainage noted. The wound margin is distinct with the outline attached to the wound base. There is small (1-33%) red granulation within the wound bed. There is a large (67-100%) amount of necrotic tissue within the wound bed including Adherent Slough. Wound #13 status is Open. Original cause  of wound was Surgical Injury. The date acquired was: 10/11/2020. The wound has been in treatment 4 weeks. 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 The wound measures 0.7cm length x 1.4cm width x 0.7cm depth; 0.77cm^2 area and 0.539cm^3 volume. There is Fat Layer (Subcutaneous Tissue) exposed. There is no tunneling or undermining noted. There is a medium amount of serosanguineous drainage noted. The wound margin is thickened. There is large (67-100%) pink granulation within the wound bed. There is a small (1-33%) amount of  necrotic tissue within the wound bed including Adherent Slough. Wound #14 status is Open. Original cause of wound was Trauma. The date acquired was: 01/17/2021. The wound has been in treatment 2 weeks. The wound is located on the Right T Second. The wound measures 1.2cm length x 2.4cm width x 0.1cm depth; 2.262cm^2 area and 0.226cm^3 volume. There is Fat Layer oe (Subcutaneous Tissue) exposed. There is no tunneling or undermining noted. There is a medium amount of serosanguineous drainage noted. The wound margin is flat and intact. There is large (67-100%) pink granulation within the wound bed. There is no necrotic tissue within the wound bed. Wound #15 status is Open. Original cause of wound was Gradually Appeared. The date acquired was: 01/25/2021. The wound has been in treatment 2 weeks. The wound is located on the Right,Posterior Shoulder. The wound measures 0cm length x 0cm width x 0cm depth; 0cm^2 area and 0cm^3 volume. There is no tunneling or undermining noted. There is a none present amount of drainage noted. The wound margin is flat and intact. There is no granulation within the wound bed. There is no necrotic tissue within the wound bed. 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 The left transmetatarsal wound dehiscence site has unchanged for several weeks now. She is using Hydrofera Blue and I would like to switch to silver alginate. I would also like to see if snap would be  covered by her insurance. This might help with her wound healing. The right second toe appears stable we will continue silver alginate. Her burn site to her right upper thigh is well-healing. Recommended continuing Santyl. Patient had blood work on 4/11 in the ED. A blood smear was obtained that showed increased plasmacytoid lymphocytes. They recommended flow cytometry. These results were called into our office although we did not order the test. These results were discussed with patient. Leonard Schwartz, [place] in our office ordered the flow cytometry and patient is going to obtain this blood work. I recommended she follow-up with her primary doctor. Procedures Wound #13 Pre-procedure diagnosis of Wound #13 is an Open Surgical Wound located on the Left Amputation Site - Transmetatarsal . There was a Excisional Skin/Subcutaneous Tissue Debridement with a total area of 0.98 sq cm performed by Geralyn Corwin, DO. With the following instrument(s): Curette to remove Viable and Non-Viable tissue/material. Material removed includes Callus, Subcutaneous Tissue, Slough, and Skin: Epidermis after achieving pain control using Other (benzocaine 20% spray). No specimens were taken. A time out was conducted at 13:15, prior to the  start of the procedure. A Minimum amount of bleeding was controlled with Pressure. The procedure was tolerated well with a pain level of 0 throughout and a pain level of 0 following the procedure. Post Debridement Measurements: 0.7cm length x 1.4cm width x 0.7cm depth; 0.539cm^3 volume. Character of Wound/Ulcer Post Debridement is improved. Post procedure Diagnosis Wound #13: Same as Pre-Procedure Wound #12 Pre-procedure diagnosis of Wound #12 is a 3rd degree Burn located on the Right Upper Leg . An Dressings and/or debridement of burns; small procedure was performed by Geralyn Corwin, DO. Post procedure Diagnosis Wound #12: Same as Pre-Procedure Notes: using #3 curette and  santyl Plan Follow-up Appointments: Return Appointment in 1 week. Other: - get lab work done [employer]: May shower and wash wound with soap and water. - shower with hibiclens soap weekly Non Wound Condition: Other Non Wound Condition Orders/Instructions: - go to emergency room if right toe and foot redness worsens, fever, chills or increased pain Home Health: No change in wound care orders this week; continue Home Health for wound care. May utilize formulary equivalent dressing for wound treatment orders unless otherwise specified. Other Home Health Orders/Instructions: - [facility] 1. Silver alginate to the transmetatarsal surgical dehiscence wound site every other day 2. Santyl to her right upper thigh 3. Silver alginate to the right second toe 4. Follow-up with primary care physician for blood test done in the ED on 4/11 Electronic Signature(s) Signed: 02/09/2021 4:58:19 PM By: Geralyn Corwin DO Entered By: Geralyn Corwin on 02/09/2021 14:23:45 -------------------------------------------------------------------------------- HxROS Details Patient Name: Date of Service: [name], [name] 02/09/2021 12:45 PM Medical Record Number: [id number] Patient Account Number: [id number] Date of Birth/Sex: Treating RN: 1966-04-30 (55 y.o. [name] Primary Care Provider: Antony Haste Other Clinician: Referring Provider: Treating Provider/Extender: Avelina Laine in Treatment: 21 Information Obtained From Patient Hematologic/Lymphatic Medical History: Positive for: Anemia Respiratory Medical History: Positive for: Sleep Apnea Cardiovascular Medical History: Positive for: Hypotension Past Medical History Notes: Wegener's granulomatosis Endocrine Medical History: Positive for: Type II Diabetes - steroid induced Genitourinary Medical History: Past Medical History Notes: CKD stage 3, Adrenal Insufficiency Musculoskeletal Medical  History: Positive for: Osteoarthritis Past Medical History Notes: Osteochondritis Neurologic Medical History: Positive for: Neuropathy Oncologic Medical History: Positive for: Received Chemotherapy Immunizations Pneumococcal Vaccine: Received Pneumococcal Vaccination: Yes Implantable Devices Yes Family and Social History Diabetes: Yes - Mother; Heart Disease: Yes - Mother; Kidney Disease: Yes - Father,Mother; Lung Disease: No; Seizures: No; Stroke: No; Thyroid Problems: No; Tuberculosis: No; Never smoker; Marital Status - Married; Alcohol Use: Never; Drug Use: No History; Caffeine Use: Never; Financial Concerns: No; Food, Clothing or Shelter Needs: No; Support System Lacking: No; Transportation Concerns: No Electronic Signature(s) Signed: 02/09/2021 4:58:19 PM By: Geralyn Corwin DO Signed: 02/09/2021 6:20:01 PM By: Zenaida Deed RN, BSN Entered By: Geralyn Corwin on 02/09/2021 14:03:50 -------------------------------------------------------------------------------- SuperBill Details Patient Name: Date of Service: [name], [name] 02/09/2021 Medical Record Number: [id number] Patient Account Number: [id number] Date of Birth/Sex: Treating RN: [birth year] (55 y.o. [name] Primary Care Provider: Antony Haste Other Clinician: Referring Provider: Treating Provider/Extender: Avelina Laine in Treatment: 21 Diagnosis Coding ICD-10 Codes Code Description [phone] Type 2 diabetes mellitus with foot ulcer T24.311A Burn of third degree of right thigh, initial encounter L97.512 Non-pressure chronic ulcer of other part of right foot with fat layer exposed L97.812 Non-pressure chronic ulcer of other part of right lower leg with fat layer exposed L97.122 Non-pressure chronic ulcer of left thigh with fat  layer exposed L97.822 Non-pressure chronic ulcer of other part of left lower leg with fat layer exposed M31.31 Wegener's granulomatosis with renal  involvement N18.9 Chronic kidney disease, unspecified Facility Procedures CPT4 Code: [id number] Description: 11042 - DEB SUBQ TISSUE 20 SQ CM/< ICD-10 Diagnosis Description L97.822 Non-pressure chronic ulcer of other part of left lower leg with fat layer expo Modifier: sed Quantity: 1 CPT4 Code: [id number] Description: 16020 - BURN DRSG W/O ANESTH-SM ICD-10 Diagnosis Description T24.311A Burn of third degree of right thigh, initial encounter Modifier: 59 Quantity: 1 Electronic Signature(s) Signed: 02/09/2021 4:58:19 PM By: Geralyn CorwinHoffman, Kanaan Kagawa DO Entered By: Geralyn CorwinHoffman, Aaria Happ on 02/09/2021 14:25:28

## 2021-02-10 LAB — SURGICAL PATHOLOGY

## 2021-02-10 NOTE — Progress Notes (Signed)
[name], [name] ([id number]) Visit Report for 02/09/2021 Arrival Information Details Patient Name: Date of Service: [name], [name] 02/09/2021 12:45 PM Medical Record Number: [id number] Patient Account Number: [id number] Date of Birth/Sex: Treating RN: [birth year] (55 y.o. [name] Primary Care Deverick Pruss: Antony Haste Other Clinician: Referring Shon Mansouri: Treating Jeric Slagel/Extender: Avelina Laine in Treatment: 21 Visit Information History Since Last Visit Added or deleted any medications: No Patient Arrived: Ambulatory Any new allergies or adverse reactions: No Arrival Time: 12:45 Had a fall or experienced change in No Accompanied By: alone activities of daily living that may affect Transfer Assistance: None risk of falls: Patient Identification Verified: Yes Signs or symptoms of abuse/neglect since last visito No Secondary Verification Process Completed: Yes Hospitalized since last visit: No Patient Requires Transmission-Based Precautions: No Implantable device outside of the clinic excluding No Patient Has Alerts: No cellular tissue based products placed in the center since last visit: Has Dressing in Place as Prescribed: Yes Pain Present Now: Yes Electronic Signature(s) Signed: 02/10/2021 5:27:18 PM By: Zandra Abts RN, BSN Entered By: Zandra Abts on 02/09/2021 12:45:49 -------------------------------------------------------------------------------- Lower Extremity Assessment Details Patient Name: Date of Service: [name], [name] 02/09/2021 12:45 PM Medical Record Number: [id number] Patient Account Number: [id number] Date of Birth/Sex: Treating RN: 1966/01/03 (55 y.o. [name] Primary Care Safiyah Cisney: Antony Haste Other Clinician: Referring Ceanna Wareing: Treating Trameka Dorough/Extender: Avelina Laine in Treatment: 21 Edema Assessment Assessed: Kyra Searles: No] Franne Forts: No] Edema: [Left: No] [Right: No] Calf Left:  Right: Point of Measurement: 30 cm From Medial Instep 35 cm 36 cm Ankle Left: Right: Point of Measurement: 9 cm From Medial Instep 22 cm 22.5 cm Vascular Assessment Pulses: Dorsalis Pedis Palpable: [Right:Yes] Electronic Signature(s) Signed: 02/10/2021 5:27:18 PM By: Zandra Abts RN, BSN Entered By: Zandra Abts on 02/09/2021 12:46:56 -------------------------------------------------------------------------------- Multi Wound Chart Details Patient Name: Date of Service: [name], [name] 02/09/2021 12:45 PM Medical Record Number: [id number] Patient Account Number: [id number] Date of Birth/Sex: Treating RN: 12/12/1965 (55 y.o. [name] Primary Care Tykwon Fera: Antony Haste Other Clinician: Referring Malena Timpone: Treating Nevaeh Casillas/Extender: Avelina Laine in Treatment: 21 Vital Signs Height(in): 66 Capillary Blood Glucose(mg/dl): 82 Weight(lbs): 840 Pulse(bpm): 69 Body Mass Index(BMI): 31 Blood Pressure(mmHg): 82/61 Temperature(F): 98 Respiratory Rate(breaths/min): 17 Photos: [12:No Photos Right Upper Leg] [13:No Photos Left Amputation Site - Transmetatarsal Right T Second] [14:No Photos oe] Wound Location: [12:Thermal Burn] [13:Surgical Injury] [14:Trauma] Wounding Event: [12:3rd degree Burn] [13:Open Surgical Wound] [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] Primary Etiology: [12:Anemia, Sleep Apnea, Hypotension, Anemia, Sleep Apnea, Hypotension, Anemia, Sleep Apnea, Hypotension,] [14:Extremity] Comorbid History: [12:Type II Diabetes, Osteoarthritis, Neuropathy, Received Chemotherapy Neuropathy, Received Chemotherapy Neuropathy, Received Chemotherapy 12/29/2020] [13:Type II Diabetes, Osteoarthritis, 10/11/2020] [14:Type II Diabetes,  Osteoarthritis, 01/17/2021] Date Acquired: [12:4] [13:4] [14:2] Weeks of Treatment: [12:Open] [13:Open] [14:Open] Wound Status: [12:2.4x4.3x0.1] [13:0.7x1.4x0.7] [14:1.2x2.4x0.1] Measurements L x W x D (cm) [12:8.105]  [13:0.77] [14:2.262] A (cm) : rea [12:0.811] [13:0.539] [14:0.226] Volume (cm) : [12:34.90%] [13:-122.50%] [14:-20.00%] % Reduction in A [12:rea: 67.40%] [13:-95.30%] [14:-20.20%] % Reduction in Volume: [12:Full Thickness Without Exposed] [13:Full Thickness Without Exposed] [14:Grade 1] Classification: [12:Support Structures Medium] [13:Support Structures Medium] [14:Medium] Exudate A mount: [12:Serous] [13:Serosanguineous] [14:Serosanguineous] Exudate Type: [12:amber] [13:red, brown] [14:red, brown] Exudate Color: [12:Distinct, outline attached] [13:Thickened] [14:Flat and Intact] Wound Margin: [12:Small (1-33%)] [13:Large (67-100%)] [14:Large (67-100%)] Granulation A mount: [12:Red] [13:Pink] [14:Pink] Granulation Quality: [12:Large (67-100%)] [13:Small (1-33%)] [14:None Present (0%)] Necrotic A mount: [12:Fat Layer (Subcutaneous Tissue): Yes Fat Layer (Subcutaneous Tissue): Yes Fat Layer (Subcutaneous  Tissue): Yes] Exposed Structures: [12:Fascia: No Tendon: No Muscle: No Joint: No Bone: No Small (1-33%)] [13:Fascia: No Tendon: No Muscle: No Joint: No Bone: No None] [14:Fascia: No Tendon: No Muscle: No Joint: No Bone: No Small (1-33%)] Epithelialization: [12:N/A] [13:Debridement - Excisional] [14:N/A] Debridement: Pre-procedure Verification/Time Out N/A [13:13:15] [14:N/A] Taken: [12:N/A] [13:Other] [14:N/A] Pain Control: [12:N/A] [13:Callus, Subcutaneous, Slough] [14:N/A] Tissue Debrided: [12:N/A] [13:Skin/Subcutaneous Tissue] [14:N/A] Level: [12:N/A] [13:0.98] [14:N/A] Debridement A (sq cm): [12:rea N/A] [13:Curette] [14:N/A] Instrument: [12:N/A] [13:Minimum] [14:N/A] Bleeding: [12:N/A] [13:Pressure] [14:N/A] Hemostasis A chieved: [12:N/A] [13:0] [14:N/A] Procedural Pain: [12:N/A] [13:0] [14:N/A] Post Procedural Pain: [12:N/A] [13:Procedure was tolerated well] [14:N/A] Debridement Treatment Response: [12:N/A] [13:0.7x1.4x0.7] [14:N/A] Post Debridement Measurements L x W x D (cm)  [12:N/A] [13:0.539] [14:N/A] Post Debridement Volume: (cm) [12:Dressings and/or debridement of] [13:Debridement] [14:N/A] Procedures Performed: [12:burns; small] [13:15] [14:N/A N/A] Photos: [12:No Photos Right, Posterior Shoulder] [13:N/A N/A] [14:N/A N/A] Wound Location: [12:Gradually Appeared] [13:N/A] [14:N/A] Wounding Event: [12:Skin T ear] [13:N/A] [14:N/A] Primary Etiology: [12:Anemia, Sleep Apnea, Hypotension,] [13:N/A] [14:N/A] Comorbid History: [12:Type II Diabetes, Osteoarthritis, Neuropathy, Received Chemotherapy 01/25/2021] [13:N/A] [14:N/A] Date A cquired: [12:2] [13:N/A] [14:N/A] Weeks of Treatment: [12:Open] [13:N/A] [14:N/A] Wound Status: [12:0x0x0] [13:N/A] [14:N/A] Measurements L x W x D (cm) [12:0] [13:N/A] [14:N/A] A (cm) : rea [12:0] [13:N/A] [14:N/A] Volume (cm) : [12:100.00%] [13:N/A] [14:N/A] % Reduction in A [12:rea: 100.00%] [13:N/A] [14:N/A] % Reduction in Volume: [12:Full Thickness Without Exposed] [13:N/A] [14:N/A] Classification: [12:Support Structures None Present] [13:N/A] [14:N/A] Exudate A mount: [12:N/A] [13:N/A] [14:N/A] Exudate Type: [12:N/A] [13:N/A] [14:N/A] Exudate Color: [12:Flat and Intact] [13:N/A] [14:N/A] Wound Margin: [12:None Present (0%)] [13:N/A] [14:N/A] Granulation A mount: [12:N/A] [13:N/A] [14:N/A] Granulation Quality: [12:None Present (0%)] [13:N/A] [14:N/A] Necrotic A mount: [12:Fascia: No] [13:N/A] [14:N/A] Exposed Structures: [12:Fat Layer (Subcutaneous Tissue): No Tendon: No Muscle: No Joint: No Bone: No Large (67-100%)] [13:N/A] [14:N/A] Epithelialization: [12:N/A] [13:N/A] [14:N/A] Debridement: [12:N/A] [13:N/A] [14:N/A] Pain Control: [12:N/A] [13:N/A] [14:N/A] Tissue Debrided: [12:N/A] [13:N/A] [14:N/A] Level: [12:N/A] [13:N/A] [14:N/A] Debridement A (sq cm): [12:rea N/A] [13:N/A] [14:N/A] Instrument: [12:N/A] [13:N/A] [14:N/A] Bleeding: [12:N/A] [13:N/A] [14:N/A] Hemostasis A chieved: [12:N/A] [13:N/A]  [14:N/A] Procedural Pain: [12:N/A] [13:N/A] [14:N/A] Post Procedural Pain: Debridement Treatment Response: N/A [13:N/A] [14:N/A] Post Debridement Measurements L x N/A [13:N/A] [14:N/A] W x D (cm) [12:N/A] [13:N/A] [14:N/A] Post Debridement Volume: (cm) [12:N/A] [13:N/A] [14:N/A] Treatment Notes Electronic Signature(s) Signed: 02/09/2021 4:58:19 PM By: Geralyn Corwin DO Signed: 02/09/2021 6:20:01 PM By: Zenaida Deed RN, BSN Entered By: Geralyn Corwin on 02/09/2021 14:02:46 -------------------------------------------------------------------------------- Multi-Disciplinary Care Plan Details Patient Name: Date of Service: [name], [name] 02/09/2021 12:45 PM Medical Record Number: [id number] Patient Account Number: [id number] Date of Birth/Sex: Treating RN: Nov 13, 1965 (55 y.o. [name] Primary Care Rossana Molchan: Antony Haste Other Clinician: Referring Asa Baudoin: Treating Jahmeer Porche/Extender: Avelina Laine in Treatment: 21 Multidisciplinary Care Plan reviewed with physician Active Inactive Wound/Skin Impairment Nursing Diagnoses: Impaired tissue integrity Knowledge deficit related to ulceration/compromised skin integrity Goals: Patient/caregiver will verbalize understanding of skin care regimen Date Initiated: 09/15/2020 Target Resolution Date: 03/09/2021 Goal Status: Active Ulcer/skin breakdown will have a volume reduction of 30% by week 4 Date Initiated: 09/15/2020 Date Inactivated: 10/13/2020 Target Resolution Date: 10/13/2020 Goal Status: Unmet Unmet Reason: infection Interventions: Assess patient/caregiver ability to obtain necessary supplies Assess patient/caregiver ability to perform ulcer/skin care regimen upon admission and as needed Assess ulceration(s) every visit Provide education on ulcer and skin care Treatment Activities: Skin care regimen initiated : 09/15/2020 Topical wound management initiated :  09/15/2020 Notes: Electronic Signature(s) Signed:  02/09/2021 6:20:01 PM By: Zenaida Deed RN, BSN Entered By: Zenaida Deed on 02/09/2021 12:40:31 -------------------------------------------------------------------------------- Pain Assessment Details Patient Name: Date of Service: [name], [name] 02/09/2021 12:45 PM Medical Record Number: [id number] Patient Account Number: [id number] Date of Birth/Sex: Treating RN: [birth year] (55 y.o. [name] Primary Care Sameria Morss: Antony Haste Other Clinician: Referring Traeson Dusza: Treating Tedd Cottrill/Extender: Avelina Laine in Treatment: 21 Active Problems Location of Pain Severity and Description of Pain Patient Has Paino No Site Locations Pain Management and Medication Current Pain Management: Electronic Signature(s) Signed: 02/10/2021 5:27:18 PM By: Zandra Abts RN, BSN Entered By: Zandra Abts on 02/09/2021 12:46:07 -------------------------------------------------------------------------------- Patient/Caregiver Education Details Patient Name: Date of Service: [name], [name] 4/20/2022andnbsp12:45 PM Medical Record Number: [id number] Patient Account Number: [id number] Date of Birth/Gender: Treating RN: [birth year] (55 y.o. [name] Primary Care Physician: Antony Haste Other Clinician: Referring Physician: Treating Physician/Extender: Avelina Laine in Treatment: 21 Education Assessment Education Provided To: Patient Education Topics Provided Wound/Skin Impairment: Methods: Explain/Verbal Responses: Reinforcements needed, State content correctly [employer]) Signed: 02/09/2021 6:20:01 PM By: Zenaida Deed RN, BSN Entered By: Zenaida Deed on 02/09/2021 12:41:12 -------------------------------------------------------------------------------- Wound Assessment Details Patient Name: Date of Service: [name], [name] 02/09/2021 12:45 PM Medical  Record Number: [id number] Patient Account Number: [id number] Date of Birth/Sex: Treating RN: 02-26-1966 (55 y.o. [name] Primary Care Casimiro Lienhard: Antony Haste Other Clinician: Referring Erendida Wrenn: Treating Feleshia Zundel/Extender: Avelina Laine in Treatment: 21 Wound Status Wound Number: 12 Primary 3rd degree Burn Etiology: Wound Location: Right Upper Leg Wound Open Wounding Event: Thermal Burn Status: Date Acquired: 12/29/2020 Comorbid Anemia, Sleep Apnea, Hypotension, Type II Diabetes, Weeks Of Treatment: 4 History: Osteoarthritis, Neuropathy, Received Chemotherapy Clustered Wound: No Photos Wound Measurements Length: (cm) 2.4 Width: (cm) 4.3 Depth: (cm) 0.1 Area: (cm) 8.105 Volume: (cm) 0.811 % Reduction in Area: 34.9% % Reduction in Volume: 67.4% Epithelialization: Small (1-33%) Tunneling: No Undermining: No Wound Description Classification: Full Thickness Without Exposed Support Structures Wound Margin: Distinct, outline attached Exudate Amount: Medium Exudate Type: Serous Exudate Color: amber Foul Odor After Cleansing: No Slough/Fibrino Yes Wound Bed Granulation Amount: Small (1-33%) Exposed Structure Granulation Quality: Red Fascia Exposed: No Necrotic Amount: Large (67-100%) Fat Layer (Subcutaneous Tissue) Exposed: Yes Necrotic Quality: Adherent Slough Tendon Exposed: No Muscle Exposed: No Joint Exposed: No Bone Exposed: No Electronic Signature(s) Signed: 02/09/2021 4:38:38 PM By: Karl Ito Signed: 02/10/2021 5:27:18 PM By: Zandra Abts RN, BSN Entered By: Karl Ito on 02/09/2021 16:38:13 -------------------------------------------------------------------------------- Wound Assessment Details Patient Name: Date of Service: [name], [name] 02/09/2021 12:45 PM Medical Record Number: [id number] Patient Account Number: [id number] Date of Birth/Sex: Treating RN: 09-Nov-1965 (55 y.o. [name] Primary Care  Zuleyka Kloc: Antony Haste Other Clinician: Referring Giavonna Pflum: Treating Janyra Barillas/Extender: Avelina Laine in Treatment: 21 Wound Status Wound Number: 13 Primary Open Surgical Wound Etiology: Wound Location: Left Amputation Site - Transmetatarsal Wound Open Wounding Event: Surgical Injury Status: Date Acquired: 10/11/2020 Comorbid Anemia, Sleep Apnea, Hypotension, Type II Diabetes, Weeks Of Treatment: 4 History: Osteoarthritis, Neuropathy, Received Chemotherapy Clustered Wound: No Photos Wound Measurements Length: (cm) 0.7 Width: (cm) 1.4 Depth: (cm) 0.7 Area: (cm) 0.77 Volume: (cm) 0.539 % Reduction in Area: -122.5% % Reduction in Volume: -95.3% Epithelialization: None Tunneling: No Undermining: No Wound Description Classification: Full Thickness Without Exposed Support Structures Wound Margin: Thickened Exudate Amount: Medium Exudate Type: Serosanguineous Exudate Color: red, brown Foul Odor After Cleansing: No Slough/Fibrino No Wound Bed Granulation Amount: Large (67-100%) Exposed Structure Granulation  Quality: Pink Fascia Exposed: No Necrotic Amount: Small (1-33%) Fat Layer (Subcutaneous Tissue) Exposed: Yes Necrotic Quality: Adherent Slough Tendon Exposed: No Muscle Exposed: No Joint Exposed: No Bone Exposed: No Electronic Signature(s) Signed: 02/09/2021 4:38:38 PM By: Karl Itoawkins, Destiny Signed: 02/10/2021 5:27:18 PM By: Zandra AbtsLynch, Shatara RN, BSN Entered By: Karl Itoawkins, Destiny on 02/09/2021 16:37:33 -------------------------------------------------------------------------------- Wound Assessment Details Patient Name: Date of Service: [name], [name] 02/09/2021 12:45 PM Medical Record Number: [id number] Patient Account Number: [id number] Date of Birth/Sex: Treating RN: 01-Oct-1966 (55 y.o. Wynelle LinkF) Lynch, Shatara Primary Care Shamarra Warda: Antony HasteBadger, Michael Other Clinician: Referring Griselda Bramblett: Treating Harvy Riera/Extender: Avelina LaineHoffman, Jessica Badger,  Michael [name]: 21 Wound Status Wound Number: 14 Primary Diabetic Wound/Ulcer of the Lower Extremity Etiology: Wound Location: Right T Second oe Wound Open Wounding Event: Trauma Status: Date Acquired: 01/17/2021 Comorbid Anemia, Sleep Apnea, Hypotension, Type II Diabetes, Weeks Of Treatment: 2 History: Osteoarthritis, Neuropathy, Received Chemotherapy Clustered Wound: No Photos Wound Measurements Length: (cm) 1.2 Width: (cm) 2.4 Depth: (cm) 0.1 Area: (cm) 2.262 Volume: (cm) 0.226 % Reduction in Area: -20% % Reduction in Volume: -20.2% Epithelialization: Small (1-33%) Tunneling: No Undermining: No Wound Description Classification: Grade 1 Wound Margin: Flat and Intact Exudate Amount: Medium Exudate Type: Serosanguineous Exudate Color: red, brown Foul Odor After Cleansing: No Slough/Fibrino No Wound Bed Granulation Amount: Large (67-100%) Exposed Structure Granulation Quality: Pink Fascia Exposed: No Necrotic Amount: None Present (0%) Fat Layer (Subcutaneous Tissue) Exposed: Yes Tendon Exposed: No Muscle Exposed: No Joint Exposed: No Bone Exposed: No Electronic Signature(s) Signed: 02/09/2021 4:38:38 PM By: Karl Itoawkins, Destiny Signed: 02/10/2021 5:27:18 PM By: Zandra AbtsLynch, Shatara RN, BSN Entered By: Karl Itoawkins, Destiny on 02/09/2021 16:37:53 -------------------------------------------------------------------------------- Wound Assessment Details Patient Name: Date of Service: [name], [name] 02/09/2021 12:45 PM Medical Record Number: [id number] Patient Account Number: [id number] Date of Birth/Sex: Treating RN: 01-Oct-1966 (55 y.o. Wynelle LinkF) Lynch, Shatara Primary Care Labresha Mellor: Antony HasteBadger, Michael Other Clinician: Referring Teague Goynes: Treating Yanuel Tagg/Extender: Avelina LaineHoffman, Jessica Badger, Michael [name]: 21 Wound Status Wound Number: 15 Primary Skin Tear Etiology: Wound Location: Right, Posterior Shoulder Wound Open Wounding Event: Gradually  Appeared Status: Date Acquired: 01/25/2021 Comorbid Anemia, Sleep Apnea, Hypotension, Type II Diabetes, Weeks Of Treatment: 2 History: Osteoarthritis, Neuropathy, Received Chemotherapy Clustered Wound: No Wound Measurements Length: (cm) Width: (cm) Depth: (cm) Area: (cm) Volume: (cm) Wound Description Classification: Full Thickness Without Exposed Support Structu Wound Margin: Flat and Intact Exudate Amount: None Present Foul Odor After Cleansing: Slough/Fibrino 0 % Reduction in Area: 100% 0 % Reduction in Volume: 100% 0 Epithelialization: Large (67-100%) 0 Tunneling: No 0 Undermining: No res No No Wound Bed Granulation Amount: None Present (0%) Exposed Structure Necrotic Amount: None Present (0%) Fascia Exposed: No Fat Layer (Subcutaneous Tissue) Exposed: No Tendon Exposed: No Muscle Exposed: No Joint Exposed: No Bone Exposed: No Electronic Signature(s) Signed: 02/10/2021 5:27:18 PM By: Zandra AbtsLynch, Shatara RN, BSN Entered By: Zandra AbtsLynch, Shatara on 02/09/2021 13:01:39 -------------------------------------------------------------------------------- Vitals Details Patient Name: Date of Service: [name], Milyn 02/09/2021 12:45 PM Medical Record Number: [id number] Patient Account Number: [id number] Date of Birth/Sex: Treating RN: 01-Oct-1966 (55 y.o. Wynelle LinkF) Lynch, Shatara Primary Care Reid Nawrot: Antony HasteBadger, Michael Other Clinician: Referring Adriel Kessen: Treating Memory Heinrichs/Extender: Avelina LaineHoffman, Jessica Badger, Michael [name]: 21 Vital Signs Time Taken: 12:49 Temperature (F): 98 Height (in): 66 Pulse (bpm): 69 Weight (lbs): 195 Respiratory Rate (breaths/min): 17 Body Mass Index (BMI): 31.5 Blood Pressure (mmHg): 82/61 Capillary Blood Glucose (mg/dl): 82 Reference Range: 80 - 120 mg / dl Electronic Signature(s) Signed: 02/10/2021 5:27:18 PM By: Zandra AbtsLynch, Shatara RN, BSN Entered  By: Zandra Abts on 02/09/2021 12:57:31

## 2021-02-23 ENCOUNTER — Encounter (HOSPITAL_BASED_OUTPATIENT_CLINIC_OR_DEPARTMENT_OTHER): Payer: BC Managed Care – PPO | Attending: Physician Assistant | Admitting: Physician Assistant

## 2021-02-23 ENCOUNTER — Other Ambulatory Visit: Payer: Self-pay

## 2021-02-23 DIAGNOSIS — N189 Chronic kidney disease, unspecified: Secondary | ICD-10-CM | POA: Diagnosis not present

## 2021-02-23 DIAGNOSIS — L97522 Non-pressure chronic ulcer of other part of left foot with fat layer exposed: Secondary | ICD-10-CM | POA: Diagnosis not present

## 2021-02-23 DIAGNOSIS — L97512 Non-pressure chronic ulcer of other part of right foot with fat layer exposed: Secondary | ICD-10-CM | POA: Insufficient documentation

## 2021-02-23 DIAGNOSIS — Z880 Allergy status to penicillin: Secondary | ICD-10-CM | POA: Insufficient documentation

## 2021-02-23 DIAGNOSIS — E1140 Type 2 diabetes mellitus with diabetic neuropathy, unspecified: Secondary | ICD-10-CM | POA: Diagnosis not present

## 2021-02-23 DIAGNOSIS — M3131 Wegener's granulomatosis with renal involvement: Secondary | ICD-10-CM | POA: Insufficient documentation

## 2021-02-23 DIAGNOSIS — Z7984 Long term (current) use of oral hypoglycemic drugs: Secondary | ICD-10-CM | POA: Diagnosis not present

## 2021-02-23 DIAGNOSIS — E11621 Type 2 diabetes mellitus with foot ulcer: Secondary | ICD-10-CM | POA: Diagnosis present

## 2021-02-23 DIAGNOSIS — I129 Hypertensive chronic kidney disease with stage 1 through stage 4 chronic kidney disease, or unspecified chronic kidney disease: Secondary | ICD-10-CM | POA: Diagnosis not present

## 2021-02-23 DIAGNOSIS — Z886 Allergy status to analgesic agent status: Secondary | ICD-10-CM | POA: Diagnosis not present

## 2021-02-23 DIAGNOSIS — E1122 Type 2 diabetes mellitus with diabetic chronic kidney disease: Secondary | ICD-10-CM | POA: Diagnosis not present

## 2021-02-23 DIAGNOSIS — L97122 Non-pressure chronic ulcer of left 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: Secondary | ICD-10-CM | POA: Diagnosis not present

## 2021-02-23 NOTE — Progress Notes (Addendum)
[name] ([id number]) Visit Report for 02/23/2021 Chief Complaint Document Details Patient Name: Date of Service: [name], [name] 02/23/2021 12:30 PM Medical Record Number: [id number] Patient Account Number: [id number] Date of Birth/Sex: Treating RN: Dec 02, 1965 (55 y.o. [name] Primary Care Provider: Antony [name] Other Clinician: Referring Provider: Treating Provider/Extender: [name] in Treatment: 23 Information Obtained from: Patient Chief Complaint Bilateral LE Ulcers and bilateral foot ulcers Electronic Signature(s) Signed: 02/23/2021 1:33:16 PM By: Stacey Kelp PA-C Previous Signature: 02/23/2021 12:46:24 PM Version By: Stacey Kelp PA-C Entered By: [name] on 02/23/2021 13:33:16 -------------------------------------------------------------------------------- Debridement Details Patient Name: Date of Service: [name] 02/23/2021 12:30 PM Medical Record Number: [id number] Patient Account Number: [id number] Date of Birth/Sex: Treating RN: 1966/06/18 (54 y.o. [name] Primary Care Provider: Antony [name] Other Clinician: Referring Provider: Treating Provider/Extender: [name] in Treatment: 23 Debridement Performed for Assessment: Wound #13 Left Amputation Site - Transmetatarsal Performed By: Physician Stacey Kelp, PA Debridement Type: Debridement Level of Consciousness (Pre-procedure): Awake and Alert Pre-procedure Verification/Time Out Yes - 13:15 Taken: Start Time: 13:15 Pain Control: Other : Benzocaine 20% T Area Debrided (L x W): otal 0.4 (cm) x 1 (cm) = 0.4 (cm) Tissue and other material debrided: Viable, Non-Viable, Callus, Slough, Skin: Epidermis, Slough Level: Skin/Epidermis Debridement Description: Selective/Open Wound Instrument: Curette Bleeding: Minimum Hemostasis Achieved: Pressure End Time: 13:19 Procedural Pain: 0 Post Procedural Pain: 0 Response to  Treatment: Procedure was tolerated well Level of Consciousness (Post- Awake and Alert procedure): Post Debridement Measurements of Total Wound Length: (cm) 0.4 Width: (cm) 1 Depth: (cm) 0.8 Volume: (cm) 0.251 Character of Wound/Ulcer Post Debridement: Improved Post Procedure Diagnosis Same as Pre-procedure Electronic Signature(s) Signed: 02/23/2021 6:39:46 PM By: Stacey Kelp PA-C Signed: 02/23/2021 7:02:06 PM By: Stacey Deed RN, BSN Entered By: [name] on 02/23/2021 13:18:49 -------------------------------------------------------------------------------- HPI Details Patient Name: Date of Service: [name], [name] 02/23/2021 12:30 PM Medical Record Number: [id number] Patient Account Number: [id number] Date of Birth/Sex: Treating RN: 15-Jun-1966 (55 y.o. [name] Primary Care Provider: Antony [name] Other Clinician: Referring Provider: Treating Provider/Extender: [name] in Treatment: 23 History of Present Illness HPI Description: 09/15/2020 upon evaluation today patient appears to be doing somewhat poorly in regard to the wounds on her feet. She has a wound on the right lateral foot, right medial great toe, right dorsal great toe, left lateral foot, and right lateral lower leg. Currently based on what we are seeing the patient states that the worst issue is her right great toe. She dates that she wishes [name] had actually gone ahead and performed amputation at this site as well when he did the other toes they have all healed well that is the right third toe, left great toe, and left third toe. All are completely healed and did excellent. Currently she has been transition into Cam boots for both feet previously she was using postop shoes but apparently this caused some rubbing on the lateral aspects of both feet and at the medial aspect of her right great toe which unfortunately has caused her a lot of discomfort and issues. She has  been given mupirocin and betamethasone for some of the small wounds that occur over her legs and arms frequently. 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 Otherwise she also does have diabetes  mellitus type 2. Currently the patient is on methotrexate and also undergoes chemotherapy infusions every 6 months for the Wegener's granulomatosis currently her amputations were asked on August 12, 2020 that was with [name]. His physician assistant [name] referred her to me today. 09/22/2020 upon evaluation today patient appears to be making good progress with the Santyl at this point. I am extremely pleased with where things stand today and I do believe him to be able to debride some of the left lateral foot wound which we have not been able to do as the eschar was too thick. I think this is doing much better currently. 09/29/2020 upon evaluation today patient appears to be doing decently well in regard to her ulcers on her feet. I do believe the Santyl has been of benefit. With that being said I am concerned about the fact that she continues to have wounds over her arms and legs bilaterally which seem to continually be an issue. Fortunately there is no signs of active infection at this time but again I am not really sure where these are coming from. She is seeing dermatology they had suggested this was pyoderma but that seems kind of unusual based on how they seem to come up. She has been using a mix of steroid and mupirocin over these areas. 12/17; this is a patient called in urgently to be seen. She was apparently in the shower washing the wound on the left lateral fifth metatarsal head and noticed a hole present. She also complained of increasing pain.No systemic signs or symptoms. 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 She says  her wound started from friction with Cam boots that she had after amputation of three toes two on the left and one on the right 10/13/2020 upon evaluation today patient appears to be doing really about the same in regard to her feet. She is scheduled for amputation surgery on the 30th of this month December 2021. 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 With that being said she does seem somewhat a little down about everything going on obviously she was hopeful to try to get the wound is healed things just have not progressed as we were hoping. Fortunately there is no signs of active infection at this time. No fevers, chills, nausea, vomiting, or diarrhea. 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 11/24/2020 patient presents for reevaluation here in our clinic after having had surgery with [name] on 10/21/2020. 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 The patient also had percutaneous left Achilles tendon lengthening. Subsequently she has done fairly well with regard to these surgeries. The right great toe has healed nicely the left transmetatarsal amputation has mostly healed although there is a small area that looks like it may be attempting to dehisce. With that being said [name] still has the sutures in place I did not do anything with this wound today though the patient wanted me to look at it. I explained that I do think [name] needs to remove the sutures when he thinks that is appropriate and subsequently if we need to help with trying to get it healed if there is a small dehiscence I will  be more than happy to aid in that regard. All this was discussed with the patient during the office visit today. 01/12/2021 upon  evaluation today patient appears to be doing actually fairly well considering the last time I saw her things were much more significant. Fortunately there does not appear to be any evidence of infection at any site. Mainly the open areas that she has right now is a burn which is 1/3 degree burn on her right thigh which occurred as a result of a curling iron injury. This is more recent and just the past several days. Fortunately there does not appear to be any evidence of infection right now which is great news. I do believe we need to try to help clean this up I think Santyl would do quite well. She does tell me that she needs to have treatment for her Wegener's granulomatosis. They have been holding off on that however due to the fact of the concerned about her healing. At this point I think if she needs to have the treatment then from a rheumatology standpoint if they feel that is necessary than she would just have to proceed with that and will have to do what we can as far as helping with healing is concerned. 01/19/2021 on evaluation today patient appears to be doing decently well in regard to her wounds. The Santyl is loosening up the necrotic tissue on the surface of the burn on her thigh. Fortunately that does not appear to be infected. 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 01/26/2021 upon evaluation today patient appears to be doing well in regard to her amputation site on the left. This is a left foot transmetatarsal region. With that being said unfortunately her toe on the right foot is showing signs of being more erythematous and swollen today. I think that we can probably need to put her on a stronger oral antibiotic she has been taking Bactrim 3 times a week but obviously that is not doing the job. 02/02/2021 upon evaluation today patient appears to be doing well with regard  to her wounds in general. I feel like everything is actually showing signs of some good improvement. She did go to the ER on Monday due to her toe she received IV antibiotics and then they switched her to clindamycin from the doxycycline that had her own she has not picked that up yet that should be today. Nonetheless I think that is fine for her to switch over to clindamycin I see no problems in that regard. With that being said the patient continues to experience some discomfort in regard to the toe but think it looks nearly as bad as what it did previous. 02/09/2021 patient presents for her 1 week follow-up. She has been using Hydrofera Blue to her transmetatarsal wound dehiscence site. She has been using silver alginate to her right second toe and Santyl to her upper right thigh wound burn. She reports stability in her wound sites and has no questions or concerns today. 02/23/2021 on evaluation today patient appears to be doing decently well in regard to her wounds on the left lateral foot as well as the right thigh. Both are making good signs of excellent progression. With that being said I do think that the biggest issue I see is getting the wound on the lateral left foot to improve as  far as depth is concerned and I think that we can speed this up with the use of a snap VAC. I believe that this will be the ideal treatment at this point. With that being said the second toe on the right foot is what is most concerning at this point. Unfortunately she has a lot going on here as far as that toe is concerned. It is draining and unfortunately does not seem to be getting much better despite mupirocin we been using. I think you might want to switch to gentamicin to see if that will do any better for her. Electronic Signature(s) Signed: 02/23/2021 1:36:49 PM By: Stacey Kelp PA-C Previous Signature: 02/23/2021 12:59:25 PM Version By: Stacey Kelp PA-C Entered By: [name] on 02/23/2021  13:36:48 -------------------------------------------------------------------------------- Dressings and/or debridement of burns; small Details Patient Name: Date of Service: [name], [name] 02/23/2021 12:30 PM Medical Record Number: [id number] Patient Account Number: [id number] Date of Birth/Sex: Treating RN: [birth year] (55 y.o. [name] Primary Care Provider: Antony [name] Other Clinician: Referring Provider: Treating Provider/Extender: [name] in Treatment: 23 Procedure Performed for: Wound #12 Right Upper Leg Performed By: Physician Stacey Kelp, PA Post Procedure Diagnosis Same as Pre-procedure Notes debrided using #3 curette, santyl applied Electronic Signature(s) Signed: 02/23/2021 6:39:46 PM By: Stacey Kelp PA-C Signed: 02/23/2021 7:02:06 PM By: Stacey Deed RN, BSN Entered By: [name] on 02/23/2021 13:30:40 -------------------------------------------------------------------------------- Physical Exam Details Patient Name: Date of Service: [name], [name] 02/23/2021 12:30 PM Medical Record Number: [id number] Patient Account Number: [id number] Date of Birth/Sex: Treating RN: [birth year] (55 y.o. [name] Primary Care Provider: Antony [name] Other Clinician: Referring Provider: Treating Provider/Extender: [name] in Treatment: 23 Constitutional Obese and well-hydrated in no acute distress. Respiratory normal breathing without difficulty. Psychiatric this patient is able to make decisions and demonstrates good insight into disease process. Alert and Oriented x 3. pleasant and cooperative. Notes Upon inspection patient's wound bed actually showed signs of good granulation epithelization at this point. There does not appear to be any evidence of infection in regard to the left foot as well as the right thigh. I did perform sharp debridement at both locations to clear away some of the  necrotic debris and slough from the surface of the wound down to good subcutaneous tissue she tolerated that without complication. With regard to the right second toe this is more concerning honestly I am afraid that this is not headed in the proper direction. She has been on oral antibiotics as well as topical mupirocin. Can probably switch her to topical gentamicin. Electronic Signature(s) Signed: 02/23/2021 1:37:24 PM By: Stacey Kelp PA-C Entered By: [name] on 02/23/2021 13:37:24 -------------------------------------------------------------------------------- Physician Orders Details Patient Name: Date of Service: [name], [name] 02/23/2021 12:30 PM Medical Record Number: [id number] Patient Account Number: [id number] Date of Birth/Sex: Treating RN: [birth year] (55 y.o. [name] Primary Care Provider: Antony [name] Other Clinician: Referring Provider: Treating Provider/Extender: [name] in Treatment: 23 Verbal / Phone Orders: No Diagnosis Coding ICD-10 Coding Code Description E11.621 Type 2 diabetes mellitus with foot ulcer T24.311A Burn of third degree of right thigh, initial encounter L97.512 Non-pressure chronic ulcer of other part of right foot with fat layer exposed L97.812 Non-pressure chronic ulcer of other part of right lower leg with fat layer exposed L97.122 Non-pressure chronic ulcer of left 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 L97.822 Non-pressure chronic ulcer of  other part of left lower leg with fat layer exposed 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 Bathing/ Shower/ Hygiene May shower and wash wound with soap and water. - shower with hibiclens soap weekly Non Wound Condition Other Non Wound Condition Orders/Instructions: - go to emergency room if right toe and foot redness worsens, fever, chills or increased  pain Home Health No change in wound care orders this week; continue Home Health for wound care. May utilize formulary equivalent dressing for wound treatment orders unless otherwise specified. - home health to order 2 in conforming rolled gauze Other Home Health Orders/Instructions: - [facility] Wound Treatment Wound #12 - Upper Leg Wound Laterality: Right Prim Dressing: Santyl Ointment (Home Health) 1 x Per Day/30 Days ary Discharge Instructions: Apply nickel thick amount to wound bed as instructed Secondary Dressing: Zetuvit Plus 4x4 in (Home Health) 1 x Per Day/30 Days Discharge Instructions: Apply over primary dressing as directed. Wound #13 - Amputation Site - Transmetatarsal Wound Laterality: Left Cleanser: Normal Saline (Home Health) 1 x Per Day/30 Days Discharge Instructions: Cleanse the wound with Normal Saline prior to applying a clean dressing using gauze sponges, not tissue or cotton balls. Prim Dressing: KerraCel Ag Gelling Fiber Dressing, 2x2 in (silver alginate) (Home Health) 1 x Per Day/30 Days ary Discharge Instructions: Apply silver alginate to wound bed as instructed Secondary Dressing: Woven Gauze Sponges 2x2 in (Home Health) 1 x Per Day/30 Days Discharge Instructions: Apply over primary dressing or may use foam border Secured With: Conforming Stretch Gauze Bandage, Sterile 2x75 (in/in) (Home Health) 1 x Per Day/30 Days Discharge Instructions: Secure with stretch gauze as directed. Wound #14 - T Second oe Wound Laterality: Right Topical: Gentamicin (Home Health) 1 x Per Day/15 Days Discharge Instructions: thin layer on wound bed with dressing changes Prim Dressing: KerraCel Ag Gelling Fiber Dressing, 2x2 in (silver alginate) (Home Health) 1 x Per Day/15 Days ary Discharge Instructions: Apply silver alginate to wound bed as instructed Secondary Dressing: Woven Gauze Sponges 2x2 in 1 x Per Day/15 Days Discharge Instructions: Apply over primary dressing as  directed. Secured With: Insurance underwriter, Sterile 2x75 (in/in) (Home Health) 1 x Per Day/15 Days Discharge Instructions: Secure with stretch gauze as directed. Secured With: Paper Tape, 2x10 (in/yd) 1 x Per Day/15 Days Discharge Instructions: Secure dressing with tape as directed. Patient Medications llergies: ibuprofen, penicillin A Notifications Medication Indication Start End prior to debridement 02/23/2021 benzocaine DOSE topical 20 % aerosol - aerosol topical 02/23/2021 gentamicin DOSE topical 0.1 % cream - cream topical Applied 1 time a day and a thin film to the open wound of the right second toe followed by application of the dressing Electronic Signature(s) Signed: 02/23/2021 1:40:07 PM By: Stacey Kelp PA-C Entered By: [name] on 02/23/2021 13:40:06 -------------------------------------------------------------------------------- Problem List Details Patient Name: Date of Service: [name], [name] 02/23/2021 12:30 PM Medical Record Number: [id number] Patient Account Number: [id number] Date of Birth/Sex: Treating RN: 1965-11-02 (55 y.o. [name] Primary Care Provider: Antony [name] Other Clinician: Referring Provider: Treating Provider/Extender: [name] in Treatment: 23 Active Problems ICD-10 Encounter Code Description Active Date MDM Diagnosis E11.621 Type 2 diabetes mellitus with foot ulcer 09/15/2020 No Yes L97.522 Non-pressure chronic ulcer of other part of left foot with fat layer exposed 02/23/2021 No Yes T24.311A Burn of third degree of right thigh, initial encounter 01/12/2021 No Yes L97.122 Non-pressure chronic ulcer of left thigh  with fat layer exposed 10/08/2020 No Yes L97.512 Non-pressure chronic ulcer of other part of right foot with fat layer exposed 09/15/2020 No Yes M31.31 Wegener's granulomatosis with renal involvement 09/15/2020 No Yes N18.9 Chronic kidney disease, unspecified 09/15/2020 No  Yes Inactive Problems Resolved Problems ICD-10 Code Description Active Date Resolved Date L97.812 Non-pressure chronic ulcer of other part of right lower leg with fat layer exposed 09/15/2020 09/15/2020 V37.106 Non-pressure chronic ulcer of other part of left lower leg with fat layer exposed 11/24/2020 11/24/2020 Electronic Signature(s) Signed: 02/23/2021 1:32:55 PM By: Stacey Kelp PA-C Previous Signature: 02/23/2021 12:46:19 PM Version By: Stacey Kelp PA-C Entered By: [name] on 02/23/2021 13:32:55 -------------------------------------------------------------------------------- Progress Note Details Patient Name: Date of Service: [name], [name] 02/23/2021 12:30 PM Medical Record Number: [id number] Patient Account Number: [id number] Date of Birth/Sex: Treating RN: Apr 01, 1966 (55 y.o. [name] Primary Care Provider: Antony [name] Other Clinician: Referring Provider: Treating Provider/Extender: [name] in Treatment: 23 Subjective Chief Complaint Information obtained from Patient Bilateral LE Ulcers and bilateral foot ulcers History of Present Illness (HPI) 09/15/2020 upon evaluation today patient appears to be doing somewhat poorly in regard to the wounds on her feet. She has a wound on the right lateral foot, right medial great toe, right dorsal great toe, left lateral foot, and right lateral lower leg. Currently based on what we are seeing the patient states that the worst issue is her right great toe. She dates that she wishes [name] had actually gone ahead and performed amputation at this site as well when he did the other toes they have all healed well that is the right third toe, left great toe, and left third toe. All are completely healed and did excellent. Currently she has been transition into Cam boots for both feet previously she was using postop shoes but apparently this caused some rubbing on the lateral aspects of both feet  and at the medial aspect of her right great toe which unfortunately has caused her a lot of discomfort and issues. She has been given mupirocin and betamethasone for some of the small wounds that occur over her legs and arms frequently. 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 Otherwise she also does have diabetes mellitus type 2. Currently the patient is on methotrexate and also undergoes chemotherapy infusions every 6 months for the Wegener's granulomatosis currently her amputations were asked on August 12, 2020 that was with [name]. His physician assistant [name] referred her to me today. 09/22/2020 upon evaluation today patient appears to be making good progress with the Santyl at this point. I am extremely pleased with where things stand today and I do believe him to be able to debride some of the left lateral foot wound which we have not been able to do as the eschar was too thick. I think this is doing much better currently. 09/29/2020 upon evaluation today patient appears to be doing decently well in regard to her ulcers on her feet. I do believe the Santyl has been of benefit. With that being said I am concerned about the fact that she continues to have wounds over her arms and legs bilaterally which seem to continually be an issue. Fortunately there is no signs of active infection at this time but again I am not really sure where these are coming from. She is seeing dermatology  they had suggested this was pyoderma but that seems kind of unusual based on how they seem to come up. She has been using a mix of steroid and mupirocin over these areas. 12/17; this is a patient called in urgently to be seen. She was apparently in the shower washing the wound on the left lateral fifth metatarsal head and noticed a hole present. She also complained of increasing pain.No  systemic signs or symptoms. 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 She says her wound started from friction with Cam boots that she had after amputation of three toes two on the left and one on the right 10/13/2020 upon evaluation today patient appears to be doing really about the same in regard to her feet. She is scheduled for amputation surgery on the 30th of this month December 2021. 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 With that being said she does seem somewhat a little down about everything going on obviously she was hopeful to try to get the wound is healed things just have not progressed as we were hoping. Fortunately there is no signs of active infection at this time. No fevers, chills, nausea, vomiting, or diarrhea. 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 11/24/2020 patient presents for reevaluation here in our clinic after having had surgery with [name] on 10/21/2020. 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 The patient also had percutaneous left Achilles tendon lengthening. Subsequently she has done fairly well with regard to these surgeries. The right great toe has healed nicely the left transmetatarsal amputation has mostly healed although there is a small area that looks like it may be attempting to dehisce. With that being said [name] still has the sutures in place I did not do anything with this wound today though the patient wanted me to look at it. I explained that I do think [name] needs to remove the sutures when he thinks that is appropriate and subsequently if we need to help with trying to get it healed if there is a small dehiscence I will be  more than happy to aid in that regard. All this was discussed with the patient during the office visit today. 01/12/2021 upon evaluation today patient appears to be doing actually fairly well considering the last time I saw her things were much more significant. Fortunately there does not appear to be any evidence of infection at any site. Mainly the open areas that she has right now is a burn which is 1/3 degree burn on her right thigh which occurred as a result of a curling iron injury. This is more recent and just the past several days. Fortunately there does not appear to be any evidence of infection right now which is great news. I do believe we need to try to help clean this up I think Santyl would do quite well. She does tell me that she needs to have treatment for her Wegener's granulomatosis. They have been holding off on that however due to the fact of the concerned about her healing. At this point I think if she needs to have the treatment then from a rheumatology standpoint if they feel that is necessary than  she would just have to proceed with that and will have to do what we can as far as helping with healing is concerned. 01/19/2021 on evaluation today patient appears to be doing decently well in regard to her wounds. The Santyl is loosening up the necrotic tissue on the surface of the burn on her thigh. Fortunately that does not appear to be infected. 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 01/26/2021 upon evaluation today patient appears to be doing well in regard to her amputation site on the left. This is a left foot transmetatarsal region. With that being said unfortunately her toe on the right foot is showing signs of being more erythematous and swollen today. I think that we can probably need to put her on a stronger oral antibiotic she has been taking Bactrim 3  times a week but obviously that is not doing the job. 02/02/2021 upon evaluation today patient appears to be doing well with regard to her wounds in general. I feel like everything is actually showing signs of some good improvement. She did go to the ER on Monday due to her toe she received IV antibiotics and then they switched her to clindamycin from the doxycycline that had her own she has not picked that up yet that should be today. Nonetheless I think that is fine for her to switch over to clindamycin I see no problems in that regard. With that being said the patient continues to experience some discomfort in regard to the toe but think it looks nearly as bad as what it did previous. 02/09/2021 patient presents for her 1 week follow-up. She has been using Hydrofera Blue to her transmetatarsal wound dehiscence site. She has been using silver alginate to her right second toe and Santyl to her upper right thigh wound burn. She reports stability in her wound sites and has no questions or concerns today. 02/23/2021 on evaluation today patient appears to be doing decently well in regard to her wounds on the left lateral foot as well as the right thigh. Both are making good signs of excellent progression. With that being said I do think that the biggest issue I see is getting the wound on the lateral left foot to improve as far as depth is concerned and I think that we can speed this up with the use of a snap VAC. I believe that this will be the ideal treatment at this point. With that being said the second toe on the right foot is what is most concerning at this point. Unfortunately she has a lot going on here as far as that toe is concerned. It is draining and unfortunately does not seem to be getting much better despite mupirocin we been using. I think you might want to switch to gentamicin to see if that will do any better for her. Objective Constitutional Obese and well-hydrated in no acute  distress. Vitals Time Taken: 12:39 PM, Height: 66 in, Weight: 195 lbs, BMI: 31.5, Temperature: 98.2 F, Pulse: 77 bpm, Respiratory Rate: 18 breaths/min, Blood Pressure: 101/71 mmHg, Capillary Blood Glucose: 106 mg/dl. Respiratory normal breathing without difficulty. Psychiatric this patient is able to make decisions and demonstrates good insight into disease process. Alert and Oriented x 3. pleasant and cooperative. General Notes: Upon inspection patient's wound bed actually showed signs of good granulation epithelization at this point. There does  not appear to be any evidence of infection in regard to the left foot as well as the right thigh. I did perform sharp debridement at both locations to clear away some of the necrotic debris and slough from the surface of the wound down to good subcutaneous tissue she tolerated that without complication. With regard to the right second toe this is more concerning honestly I am afraid that this is not headed in the proper direction. She has been on oral antibiotics as well as topical mupirocin. Can probably switch her to topical gentamicin. Integumentary (Hair, Skin) Wound #12 status is Open. Original cause of wound was Thermal Burn. The date acquired was: 12/29/2020. The wound has been in treatment 6 weeks. The wound is located on the Right Upper Leg. The wound measures 1.1cm length x 3cm width x 0.2cm depth; 2.592cm^2 area and 0.518cm^3 volume. There is Fat Layer (Subcutaneous Tissue) exposed. There is no tunneling or undermining noted. There is a medium amount of serous drainage noted. The wound margin is distinct with the outline attached to the wound base. There is medium (34-66%) red granulation within the wound bed. There is a medium (34-66%) amount of necrotic tissue within the wound bed including Adherent Slough. Wound #13 status is Open. Original cause of wound was Surgical Injury. The date acquired was: 10/11/2020. The wound has been in treatment  6 weeks. 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 The wound measures 0.4cm length x 1cm width x 0.8cm depth; 0.314cm^2 area and 0.251cm^3 volume. There is Fat Layer (Subcutaneous Tissue) exposed. There is no undermining noted, however, there is tunneling at 9:00 with a maximum distance of 1cm. There is a medium amount of serosanguineous drainage noted. The wound margin is thickened. There is large (67-100%) pink granulation within the wound bed. There is a small (1-33%) amount of necrotic tissue within the wound bed including Adherent Slough. Wound #14 status is Open. Original cause of wound was Trauma. The date acquired was: 01/17/2021. The wound has been in treatment 4 weeks. The wound is located on the Right T Second. The wound measures 1.2cm length x 1.3cm width x 0.1cm depth; 1.225cm^2 area and 0.123cm^3 volume. There is Fat Layer oe (Subcutaneous Tissue) exposed. There is no tunneling or undermining noted. There is a medium amount of serosanguineous drainage noted. The wound margin is flat and intact. There is large (67-100%) pink granulation within the wound bed. There is no necrotic tissue within the wound bed. 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 There was a Selective/Open Wound Skin/Epidermis Debridement with a total area of 0.4 sq cm performed by Stacey Kelp, PA. With the following instrument(s): Curette to remove Viable  and Non-Viable tissue/material. Material removed includes Callus, Slough, and Skin: Epidermis after achieving pain control using Other (Benzocaine 20%). No specimens were taken. A time out was conducted at 13:15, prior to the start of the procedure. A Minimum amount of bleeding was controlled with Pressure. The procedure was tolerated well with a pain level of  0 throughout and a pain level of 0 following the procedure. Post Debridement Measurements: 0.4cm length x 1cm width x 0.8cm depth; 0.251cm^3 volume. Character of Wound/Ulcer Post Debridement is improved. Post procedure Diagnosis Wound #13: Same as Pre-Procedure Wound #12 Pre-procedure diagnosis of Wound #12 is a 3rd degree Burn located on the Right Upper Leg . An Dressings and/or debridement of burns; small procedure was performed by Stacey Kelp, PA. Post procedure Diagnosis Wound #12: Same as Pre-Procedure Notes: debrided using #3 curette, santyl applied Plan Follow-up Appointments: Return Appointment in 1 week. Bathing/ Shower/ Hygiene: May shower and wash wound with soap and water. - shower with hibiclens soap weekly Non Wound Condition: Other Non Wound Condition Orders/Instructions: - go to emergency room if right toe and foot redness worsens, fever, chills or increased pain Home Health: No change in wound care orders this week; continue Home Health for wound care. May utilize formulary equivalent dressing for wound treatment orders unless otherwise specified. - home health to order 2 in conforming rolled gauze Other Home Health Orders/Instructions: [facility] The following medication(s) was prescribed: benzocaine topical 20 % aerosol aerosol topical for prior to debridement was prescribed at facility gentamicin topical 0.1 % cream cream topical Applied 1 time a day and a thin film to the open wound of the right second toe followed by application of the dressing starting 02/23/2021 WOUND #12: - Upper Leg Wound Laterality:  Right Prim Dressing: Santyl Ointment (Home Health) 1 x Per Day/30 Days ary Discharge Instructions: Apply nickel thick amount to wound bed as instructed Secondary Dressing: Zetuvit Plus 4x4 in (Home Health) 1 x Per Day/30 Days Discharge Instructions: Apply over primary dressing as directed. WOUND #13: - Amputation Site - Transmetatarsal Wound Laterality: Left Cleanser: Normal Saline (Home Health) 1 x Per Day/30 Days Discharge Instructions: Cleanse the wound with Normal Saline prior to applying a clean dressing using gauze sponges, not tissue or cotton balls. Prim Dressing: KerraCel Ag Gelling Fiber Dressing, 2x2 in (silver alginate) (Home Health) 1 x Per Day/30 Days ary Discharge Instructions: Apply silver alginate to wound bed as instructed Secondary Dressing: Woven Gauze Sponges 2x2 in (Home Health) 1 x Per Day/30 Days Discharge Instructions: Apply over primary dressing or may use foam border Secured With: Conforming Stretch Gauze Bandage, Sterile 2x75 (in/in) (Home Health) 1 x Per Day/30 Days Discharge Instructions: Secure with stretch gauze as directed. WOUND #14: - T Second Wound Laterality: Right oe Topical: Gentamicin (Home Health) 1 x Per Day/15 Days Discharge Instructions: thin layer on wound bed with dressing changes Prim Dressing: KerraCel Ag Gelling Fiber Dressing, 2x2 in (silver alginate) (Home Health) 1 x Per Day/15 Days ary Discharge Instructions: Apply silver alginate to wound bed as instructed Secondary Dressing: Woven Gauze Sponges 2x2 in 1 x Per Day/15 Days Discharge Instructions: Apply over primary dressing as directed. Secured With: Insurance underwriter, Sterile 2x75 (in/in) (Home Health) 1 x Per Day/15 Days Discharge Instructions: Secure with stretch gauze as directed. Secured With: Paper Tape, 2x10 (in/yd) 1 x Per Day/15 Days Discharge Instructions: Secure dressing with tape as directed. 1. Would recommend currently that for the left lateral foot which  is a surgical ulceration I do believe that a snap VAC would be an ideal treatment for her here. I think that will help this to granulate in faster and get her done as far as that wound is concerned with the wound center much more rapidly. Subsequently this should be done for weekly changes here in the clinic.  2. I am also can recommend that we continue with the Santyl for the right thigh that seems to be doing an excellent job. 3. I am also going to switch to gentamicin cream topically for the right second toe. Following the application of the gentamicin cream she will then actually apply the silver alginate dressing. This should be once a day. We will see patient back for reevaluation in 1 week here in the clinic. If anything worsens or changes patient will contact our office for additional recommendations. Electronic Signature(s) Signed: 02/23/2021 1:40:15 PM By: Stacey Kelp PA-C Entered By: [name] on 02/23/2021 13:40:15 -------------------------------------------------------------------------------- SuperBill Details Patient Name: Date of Service: [name], [name] 02/23/2021 Medical Record Number: [id number] Patient Account Number: [id number] Date of Birth/Sex: Treating RN: [birth year] (55 y.o. [name] Primary Care Provider: Antony [name] Other Clinician: Referring Provider: Treating Provider/Extender: [name] in Treatment: 23 Diagnosis Coding ICD-10 Codes Code Description E11.621 Type 2 diabetes mellitus with foot ulcer L97.522 Non-pressure chronic ulcer of other part of left foot with fat layer exposed T24.311A Burn of third degree of right thigh, initial encounter L97.122 Non-pressure chronic ulcer of left 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 L97.512 Non-pressure chronic ulcer of other part of right foot with fat layer exposed M31.31 Wegener's granulomatosis with renal involvement N18.9 Chronic kidney disease, unspecified Facility  Procedures CPT4 Code: [id number] Description: 11042 - DEB SUBQ TISSUE 20 SQ CM/< ICD-10 Diagnosis Description L97.522 Non-pressure chronic ulcer of other part of left foot with fat layer exposed Modifier: Quantity: 1 CPT4 Code: [id number] Description: 16020 - BURN DRSG W/O ANESTH-SM ICD-10 Diagnosis Description T24.311A Burn of third degree of right thigh, initial encounter Modifier: Quantity: 1 Physician Procedures : CPT4 Code Description Modifier [id number] - WC PHYS LEVEL 4 - EST PT 25 ICD-10 Diagnosis Description E11.621 Type 2 diabetes mellitus with foot ulcer L97.522 Non-pressure chronic ulcer of other part of left foot with fat layer exposed T24.311A Burn  of third degree of right thigh, initial encounter L97.512 Non-pressure chronic ulcer of other part of right foot with fat layer exposed Quantity: 1 : [id number] 11042 - WC PHYS SUBQ TISS 20 SQ CM 1 ICD-10 Diagnosis Description L97.522 Non-pressure chronic ulcer of other part of left foot with fat layer exposed Quantity: : [id number] 16020 - WC PHYS DRESS/DEBRID SM,<5% TOT BODY SURF 1 ICD-10 Diagnosis Description T24.311A Burn of third degree of right thigh, initial encounter Quantity: Electronic Signature(s) Signed: 02/23/2021 1:42:03 PM By: Stacey Kelp PA-C Entered By: [name] on 02/23/2021 13:42:02

## 2021-02-25 NOTE — Progress Notes (Signed)
PRIM, MORACE ([id number]) Visit Report for 02/23/2021 Arrival Information Details Patient Name: Date of Service: [name] 02/23/2021 12:30 PM Medical Record Number: [id number] Patient Account Number: [id number] Date of Birth/Sex: Treating RN: February 12, 1966 (55 y.o. [name], [name] Primary Care Laurence Folz: Antony Haste Other Clinician: Referring Benedict Kue: Treating Chevelle Durr/Extender: Nanetta Batty in Treatment: 23 Visit Information History Since Last Visit Added or deleted any medications: No Patient Arrived: Wheel Chair Any new allergies or adverse reactions: No Arrival Time: 12:38 Had a fall or experienced change in No Accompanied By: family member activities of daily living that may affect Transfer Assistance: None risk of falls: Patient Identification Verified: Yes Signs or symptoms of abuse/neglect since last visito No Secondary Verification Process Completed: Yes Hospitalized since last visit: No Patient Requires Transmission-Based Precautions: No Implantable device outside of the clinic excluding No Patient Has Alerts: No cellular tissue based products placed in the center since last visit: Has Dressing in Place as Prescribed: Yes Pain Present Now: No Electronic Signature(s) Signed: 02/23/2021 6:54:10 PM By: [name] Entered By: [name] on 02/23/2021 12:46:41 -------------------------------------------------------------------------------- Encounter Discharge Information Details Patient Name: Date of Service: [name] 02/23/2021 12:30 PM Medical Record Number: [id number] Patient Account Number: [id number] Date of Birth/Sex: Treating RN: 13-Aug-1966 (55 y.o. [name], [name] Primary Care Terriyah Westra: Antony Haste Other Clinician: Referring Zaydah Nawabi: Treating Iann Rodier/Extender: Nanetta Batty in Treatment: 23 Encounter Discharge Information Items Post Procedure Vitals Discharge Condition: Stable Temperature  (F): 98.2 Ambulatory Status: Wheelchair Pulse (bpm): 77 Discharge Destination: Home Respiratory Rate (breaths/min): 18 Transportation: Private Auto Blood Pressure (mmHg): 101/71 Accompanied By: husband Schedule Follow-up Appointment: Yes Clinical Summary of Care: Patient Declined Electronic Signature(s) Signed: 02/25/2021 3:14:58 PM By: Fonnie Mu RN Entered By: Fonnie Mu on 02/23/2021 14:32:22 -------------------------------------------------------------------------------- Lower Extremity Assessment Details Patient Name: Date of Service: [name], [name] 02/23/2021 12:30 PM Medical Record Number: [id number] Patient Account Number: [id number] Date of Birth/Sex: Treating RN: 07/02/1966 (55 y.o. Stacey Silence Primary Care Josi Roediger: Antony Haste Other Clinician: Referring Maelle Sheaffer: Treating Rollen Selders/Extender: Nanetta Batty in Treatment: 23 Edema Assessment Assessed: [name]: Yes] Franne Forts: Yes] Edema: [Left: No] [Right: No] Calf Left: Right: Point of Measurement: 30 cm From Medial Instep 38 cm 38.5 cm Ankle Left: Right: Point of Measurement: 9 cm From Medial Instep 22.5 cm 22.5 cm Electronic Signature(s) Signed: 02/23/2021 6:54:10 PM By: [name] Entered By: [name] on 02/23/2021 12:47:44 -------------------------------------------------------------------------------- Multi-Disciplinary Care Plan Details Patient Name: Date of Service: [name], [name] 02/23/2021 12:30 PM Medical Record Number: [id number] Patient Account Number: [id number] Date of Birth/Sex: Treating RN: 1966/02/09 (55 y.o. [name] Primary Care Kaine Mcquillen: Antony Haste Other Clinician: Referring Katiejo Gilroy: Treating Orlyn Odonoghue/Extender: Nanetta Batty in Treatment: 23 Multidisciplinary Care Plan reviewed with physician Active Inactive Wound/Skin Impairment Nursing Diagnoses: Impaired tissue integrity Knowledge deficit related to  ulceration/compromised skin integrity Goals: Patient/caregiver will verbalize understanding of skin care regimen Date Initiated: 09/15/2020 Target Resolution Date: 03/09/2021 Goal Status: Active Ulcer/skin breakdown will have a volume reduction of 30% by week 4 Date Initiated: 09/15/2020 Date Inactivated: 10/13/2020 Target Resolution Date: 10/13/2020 Goal Status: Unmet Unmet Reason: infection Interventions: Assess patient/caregiver ability to obtain necessary supplies Assess patient/caregiver ability to perform ulcer/skin care regimen upon admission and as needed Assess ulceration(s) every visit Provide education on ulcer and skin care Treatment Activities: Skin care regimen initiated : 09/15/2020 Topical wound management initiated : 09/15/2020 Notes: Electronic Signature(s) Signed: 02/23/2021 7:02:06 PM By:  Zenaida DeedBoehlein, Linda RN, BSN Entered By: Zenaida DeedBoehlein, Linda on 02/23/2021 13:11:07 -------------------------------------------------------------------------------- Pain Assessment Details Patient Name: Date of Service: [name], [name] 02/23/2021 12:30 PM Medical Record Number: [id number] Patient Account Number: [id number] Date of Birth/Sex: Treating RN: [birth year] (55 y.o. [name]) Deaton, Bobbi Primary Care Orlando Devereux: Antony HasteBadger, Michael Other Clinician: Referring Allie Ousley: Treating Anothony Bursch/Extender: Nanetta BattyStone III, Hoyt Badger, Michael [name]: 23 Active Problems Location of Pain Severity and Description of Pain Patient Has Paino No Site Locations Rate the pain. Current Pain Level: 0 Pain Management and Medication Current Pain Management: Medication: No Cold Application: No Rest: No Massage: No Activity: No T.E.N.S.: No Heat Application: No Leg drop or elevation: No Is the Current Pain Management Adequate: Adequate How does your wound impact your activities of daily livingo Sleep: No Bathing: No Appetite: No Relationship With Others: No Bladder Continence: No Emotions:  No Bowel Continence: No Work: No Toileting: No Drive: No Dressing: No Hobbies: No Notes pain in right thigh at times. Electronic Signature(s) Signed: 02/23/2021 6:54:10 PM By: Shawn Stalleaton, Bobbi Entered By: Shawn Stalleaton, Bobbi on 02/23/2021 12:47:25 -------------------------------------------------------------------------------- Patient/Caregiver Education Details Patient Name: Date of Service: [name], [name] 5/4/2022andnbsp12:30 PM Medical Record Number: [id number] Patient Account Number: [id number] Date of Birth/Gender: Treating RN: [birth year] (55 y.o. Stacey StandardF) Boehlein, Linda Primary Care Physician: Antony HasteBadger, Michael Other Clinician: Referring Physician: Treating Physician/Extender: Nanetta BattyStone III, Hoyt Badger, Michael [name]: 23 Education Assessment Education Provided To: Patient Education Topics Provided Offloading: Methods: Explain/Verbal Responses: Reinforcements needed, State content correctly Wound/Skin Impairment: Electronic Signature(s) Signed: 02/23/2021 7:02:06 PM By: Zenaida DeedBoehlein, Linda RN, BSN Entered By: Zenaida DeedBoehlein, Linda on 02/23/2021 13:11:39 -------------------------------------------------------------------------------- Wound Assessment Details Patient Name: Date of Service: [name], [name] 02/23/2021 12:30 PM Medical Record Number: [id number] Patient Account Number: [id number] Date of Birth/Sex: Treating RN: [birth year] (55 y.o. 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 Etiology: Wound Location: Right Upper Leg Wound Open Wounding Event: Thermal Burn Status: Date Acquired: 12/29/2020 Comorbid Anemia, Sleep Apnea, Hypotension, Type II Diabetes, Weeks Of Treatment: 6 History: Osteoarthritis, Neuropathy, Received Chemotherapy Clustered Wound: No Photos Wound Measurements Length:  (cm) 1.1 Width: (cm) 3 Depth: (cm) 0.2 Area: (cm) 2.592 Volume: (cm) 0.518 % Reduction in Area: 79.2% % Reduction in Volume: 79.2% Epithelialization: Medium (34-66%) Tunneling: No Undermining: No Wound Description Classification: Full Thickness Without Exposed Support Structures Wound Margin: Distinct, outline attached Exudate Amount: Medium Exudate Type: Serous Exudate Color: amber Foul Odor After Cleansing: No Slough/Fibrino Yes Wound Bed Granulation Amount: Medium (34-66%) Exposed Structure Granulation Quality: Red Fascia Exposed: No Necrotic Amount: Medium (34-66%) Fat Layer (Subcutaneous Tissue) Exposed: Yes Necrotic Quality: Adherent Slough Tendon Exposed: No Muscle Exposed: No Joint Exposed: No Bone Exposed: No Treatment Notes Wound #12 (Upper Leg) Wound Laterality: Right Cleanser Peri-Wound Care Topical Primary Dressing Santyl Ointment Discharge Instruction: Apply nickel thick amount to wound bed as instructed Secondary Dressing Zetuvit Plus 4x4 in Discharge Instruction: Apply over primary dressing as directed. Secured With Compression Wrap Compression Stockings Facilities managerAdd-Ons Electronic Signature(s) Signed: 02/23/2021 4:55:09 PM By: Karl Itoawkins, Destiny Signed: 02/23/2021 6:54:10 PM By: Shawn Stalleaton, Bobbi Entered By: Karl Itoawkins, Destiny on 02/23/2021 16:46:01 -------------------------------------------------------------------------------- Wound Assessment Details Patient Name: Date of Service: [name], Nance 02/23/2021 12:30 PM Medical Record Number: [id number] Patient Account Number: [id number] Date of Birth/Sex: Treating RN: [birth year] (55 y.o. 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 Wound Open Wounding Event:  Surgical Injury Status: Date Acquired: 10/11/2020 Comorbid Anemia, Sleep Apnea, Hypotension, Type II Diabetes, Weeks Of Treatment: 6 History: Osteoarthritis, Neuropathy, Received Chemotherapy History: Osteoarthritis, Neuropathy, Received Chemotherapy Clustered Wound: No Photos Wound Measurements Length: (cm) 0.4 Width: (cm) 1 Depth: (cm) 0.8 Area: (cm) 0.314 Volume: (cm) 0.251 % Reduction in Area: 9.2% % Reduction in Volume: 9.1% Epithelialization: None Tunneling: Yes Position (o'clock): 9 Maximum Distance: (cm) 1 Undermining: No Wound Description Classification: Full Thickness Without Exposed Support Structures Wound Margin: Thickened Exudate Amount: Medium Exudate Type: Serosanguineous Exudate Color: red, brown Foul Odor After Cleansing: No Slough/Fibrino Yes Wound Bed Granulation Amount: Large (67-100%) Exposed Structure Granulation Quality: Pink Fascia Exposed: No Necrotic Amount: Small (1-33%) Fat Layer (Subcutaneous Tissue) Exposed: Yes Necrotic Quality: Adherent Slough Tendon Exposed: No Muscle Exposed: No Joint Exposed: No Bone Exposed: No Treatment Notes Wound #13 (Amputation Site - Transmetatarsal) Wound Laterality: Left Cleanser Normal Saline Discharge Instruction: Cleanse the wound with Normal Saline prior to applying a clean dressing using gauze sponges, not tissue or cotton balls. Peri-Wound Care Topical Primary Dressing KerraCel Ag Gelling Fiber Dressing, 2x2 in (silver alginate) Discharge Instruction: Apply silver alginate to wound bed as instructed Secondary Dressing Woven Gauze Sponges 2x2 in Discharge Instruction: Apply over primary dressing or may use foam border Secured With Conforming Stretch Gauze Bandage, Sterile 2x75 (in/in) Discharge Instruction: Secure with stretch gauze as directed. Compression Wrap Compression Stockings Add-Ons Electronic Signature(s) Signed: 02/23/2021 4:55:09 PM By: Karl Ito Signed: 02/23/2021 6:54:10  PM By: [name] Entered By: Karl Ito on 02/23/2021 16:46:18 -------------------------------------------------------------------------------- Wound Assessment Details Patient Name: Date of Service: [name], [name] 02/23/2021 12:30 PM Medical Record Number: [id number] Patient Account Number: [id number] Date of Birth/Sex: Treating RN: 01-18-1966 (55 y.o. [name], [name] Primary Care Nkechi Linehan: Antony Haste Other Clinician: Referring Brayla Pat: Treating Atharv Barriere/Extender: Nanetta Batty in Treatment: 23 Wound Status Wound Number: 14 Primary Diabetic Wound/Ulcer of the Lower Extremity Etiology: Wound Location: Right T Second oe Wound Open Wounding Event: Trauma Status: Date Acquired: 01/17/2021 Comorbid Anemia, Sleep Apnea, Hypotension, Type II Diabetes, Weeks Of Treatment: 4 History: Osteoarthritis, Neuropathy, Received Chemotherapy Clustered Wound: No Photos Wound Measurements Length: (cm) 1.2 Width: (cm) 1.3 Depth: (cm) 0.1 Area: (cm) 1.225 Volume: (cm) 0.123 % Reduction in Area: 35% % Reduction in Volume: 34.6% Epithelialization: Medium (34-66%) Tunneling: No Undermining: No Wound Description Classification: Grade 1 Wound Margin: Flat and Intact Exudate Amount: Medium Exudate Type: Serosanguineous Exudate Color: red, brown Foul Odor After Cleansing: No Slough/Fibrino No Wound Bed Granulation Amount: Large (67-100%) Exposed Structure Granulation Quality: Pink Fascia Exposed: No Necrotic Amount: None Present (0%) Fat Layer (Subcutaneous Tissue) Exposed: Yes Tendon Exposed: No Muscle Exposed: No Joint Exposed: No Bone Exposed: No Treatment Notes Wound #14 (Toe Second) Wound Laterality: Right Cleanser Peri-Wound Care Topical Gentamicin Discharge Instruction: thin layer on wound bed with dressing changes Primary Dressing KerraCel Ag Gelling Fiber Dressing, 2x2 in (silver alginate) Discharge Instruction: Apply silver  alginate to wound bed as instructed Secondary Dressing Woven Gauze Sponges 2x2 in Discharge Instruction: Apply over primary dressing as directed. Secured With Conforming Stretch Gauze Bandage, Sterile 2x75 (in/in) Discharge Instruction: Secure with stretch gauze as directed. Paper Tape, 2x10 (in/yd) Discharge Instruction: Secure dressing with tape as directed. Compression Wrap Compression Stockings Add-Ons Electronic Signature(s) Signed: 02/23/2021 4:55:09 PM By: Karl Ito Signed:  02/23/2021 6:54:10 PM By: [name] Entered By: Karl Ito on 02/23/2021 16:45:44 -------------------------------------------------------------------------------- Vitals Details Patient Name: Date of Service: [name], [name] 02/23/2021 12:30 PM Medical Record Number: [id number] Patient Account Number: [id number] Date of Birth/Sex: Treating RN: [birth year] (55 y.o. [name], [name] Primary Care Janaiyah Blackard: Antony Haste Other Clinician: Referring Jakeem Grape: Treating Lora Chavers/Extender: Nanetta Batty in Treatment: 23 Vital Signs Time Taken: 12:39 Temperature (F): 98.2 Height (in): 66 Pulse (bpm): 77 Weight (lbs): 195 Respiratory Rate (breaths/min): 18 Body Mass Index (BMI): 31.5 Blood Pressure (mmHg): 101/71 Capillary Blood Glucose (mg/dl): 272 Reference Range: 80 - 120 mg / dl Electronic Signature(s) Signed: 02/23/2021 6:54:10 PM By: [name] Entered By: [name] on 02/23/2021 12:47:06

## 2021-03-02 ENCOUNTER — Encounter (HOSPITAL_BASED_OUTPATIENT_CLINIC_OR_DEPARTMENT_OTHER): Payer: BC Managed Care – PPO | Admitting: Physician Assistant

## 2021-03-02 ENCOUNTER — Other Ambulatory Visit: Payer: Self-pay

## 2021-03-02 DIAGNOSIS — E11621 Type 2 diabetes mellitus with foot ulcer: Secondary | ICD-10-CM | POA: Diagnosis not present

## 2021-03-02 NOTE — Progress Notes (Addendum)
[name], [name] ([id number]) Visit Report for 03/02/2021 Chief Complaint Document Details Patient Name: Date of Service: [name], [name] 03/02/2021 3:45 PM Medical Record Number: [id number] Patient Account Number: [id number] Date of Birth/Sex: Treating RN: 24-Apr-1966 (55 y.o. [name] Primary Care Provider: Antony Haste Other Clinician: Referring Provider: Treating Provider/Extender: Nanetta Batty in Treatment: 24 Information Obtained from: Patient Chief Complaint Bilateral LE Ulcers and bilateral foot ulcers Electronic Signature(s) Signed: 03/02/2021 4:25:16 PM By: Lenda Kelp PA-C Entered By: Lenda Kelp on 03/02/2021 16:25:16 -------------------------------------------------------------------------------- Debridement Details Patient Name: Date of Service: [name], [name] 03/02/2021 3:45 PM Medical Record Number: [id number] Patient Account Number: [id number] Date of Birth/Sex: Treating RN: Dec 02, 1965 (55 y.o. [name] Primary Care Provider: Antony Haste Other Clinician: Referring Provider: Treating Provider/Extender: Nanetta Batty in Treatment: 24 Debridement Performed for Assessment: Wound #12 Right Upper Leg Performed By: Clinician Fonnie Mu, RN Debridement Type: Chemical/Enzymatic/Mechanical Agent Used: Santyl Level of Consciousness (Pre-procedure): Awake and Alert Pre-procedure Verification/Time Out No Taken: Bleeding: None Response to Treatment: Procedure was tolerated well Level of Consciousness (Post- Awake and Alert procedure): Post Debridement Measurements of Total Wound Length: (cm) 1 Width: (cm) 3 Depth: (cm) 0.1 Volume: (cm) 0.236 Character of Wound/Ulcer Post Debridement: Requires Further Debridement Post Procedure Diagnosis Same as Pre-procedure Electronic Signature(s) Signed: 03/02/2021 5:37:47 PM By: Lenda Kelp PA-C Signed: 03/02/2021 6:13:52 PM By: Zenaida Deed RN,  BSN Entered By: Zenaida Deed on 03/02/2021 16:49:30 -------------------------------------------------------------------------------- HPI Details Patient Name: Date of Service: [name], [name] 03/02/2021 3:45 PM Medical Record Number: [id number] Patient Account Number: [id number] Date of Birth/Sex: Treating RN: August 23, 1966 (55 y.o. [name] Primary Care Provider: Antony Haste Other Clinician: Referring Provider: Treating Provider/Extender: Nanetta Batty in Treatment: 24 History of Present Illness HPI Description: 09/15/2020 upon evaluation today patient appears to be doing somewhat poorly in regard to the wounds on her feet. She has a wound on the right lateral foot, right medial great toe, right dorsal great toe, left lateral foot, and right lateral lower leg. Currently based on what we are seeing the patient states that the worst issue is her right great toe. She dates that she wishes Dr. Victorino Dike had actually gone ahead and performed amputation at this site as well when he did the other toes they have all healed well that is the right third toe, left great toe, and left third toe. All are completely healed and did excellent. Currently she has been transition into Cam boots for both feet previously she was using postop shoes but apparently this caused some rubbing on the lateral aspects of both feet and at the medial aspect of her right great toe which unfortunately has caused her a lot of discomfort and issues. She has been given mupirocin and betamethasone for some of the small wounds that occur over her legs and arms frequently. 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 Otherwise she also does have diabetes mellitus type 2. Currently the patient is on methotrexate and also undergoes chemotherapy infusions every 6 months for the Wegener's  granulomatosis currently her amputations were asked on August 12, 2020 that was with Dr. Victorino Dike. His physician assistant Jill Alexanders referred her to me today. 09/22/2020 upon evaluation today patient appears to be making good progress with the Santyl at this point. I am extremely pleased with where things  stand today and I do believe him to be able to debride some of the left lateral foot wound which we have not been able to do as the eschar was too thick. I think this is doing much better currently. 09/29/2020 upon evaluation today patient appears to be doing decently well in regard to her ulcers on her feet. I do believe the Santyl has been of benefit. With that being said I am concerned about the fact that she continues to have wounds over her arms and legs bilaterally which seem to continually be an issue. Fortunately there is no signs of active infection at this time but again I am not really sure where these are coming from. She is seeing dermatology they had suggested this was pyoderma but that seems kind of unusual based on how they seem to come up. She has been using a mix of steroid and mupirocin over these areas. 12/17; this is a patient called in urgently to be seen. She was apparently in the shower washing the wound on the left lateral fifth metatarsal head and noticed a hole present. She also complained of increasing pain.No systemic signs or symptoms. 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 She says her wound started from friction with Cam boots that she had after amputation of three toes two on the left and one on the right 10/13/2020 upon evaluation today patient appears to be doing really about the same in regard to her feet. She is scheduled for amputation surgery on the 30th of this month December 2021. 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 With that being said  she does seem somewhat a little down about everything going on obviously she was hopeful to try to get the wound is healed things just have not progressed as we were hoping. Fortunately there is no signs of active infection at this time. No fevers, chills, nausea, vomiting, or diarrhea. 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 11/24/2020 patient presents for reevaluation here in our clinic after having had surgery with Dr. Victorino Dike on 10/21/2020. 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 The patient also had percutaneous left Achilles tendon lengthening. Subsequently she has done fairly well with regard to these surgeries. The right great toe has healed nicely the left transmetatarsal amputation has mostly healed although there is a small area that looks like it may be attempting to dehisce. With that being said Dr. Victorino Dike still has the sutures in place I did not do anything with this wound today though the patient wanted me to look at it. I explained that I do think Dr. Victorino Dike needs to remove the sutures when he thinks that is appropriate and subsequently if we need to help with trying to get it healed if there is a small dehiscence I will be more than happy to aid in that regard. All this was discussed with the patient during the office visit today. 01/12/2021 upon evaluation today patient appears to be doing actually fairly well considering the last time I saw her things were much more significant. Fortunately there does not appear to be any evidence of infection at any site. Mainly the open areas that she has right now  is a burn which is 1/3 degree burn on her right thigh which occurred as a result of a curling iron injury. This is more recent and just the past several days. Fortunately there does not appear to  be any evidence of infection right now which is great news. I do believe we need to try to help clean this up I think Santyl would do quite well. She does tell me that she needs to have treatment for her Wegener's granulomatosis. They have been holding off on that however due to the fact of the concerned about her healing. At this point I think if she needs to have the treatment then from a rheumatology standpoint if they feel that is necessary than she would just have to proceed with that and will have to do what we can as far as helping with healing is concerned. 01/19/2021 on evaluation today patient appears to be doing decently well in regard to her wounds. The Santyl is loosening up the necrotic tissue on the surface of the burn on her thigh. Fortunately that does not appear to be infected. 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 01/26/2021 upon evaluation today patient appears to be doing well in regard to her amputation site on the left. This is a left foot transmetatarsal region. With that being said unfortunately her toe on the right foot is showing signs of being more erythematous and swollen today. I think that we can probably need to put her on a stronger oral antibiotic she has been taking Bactrim 3 times a week but obviously that is not doing the job. 02/02/2021 upon evaluation today patient appears to be doing well with regard to her wounds in general. I feel like everything is actually showing signs of some good improvement. She did go to the ER on Monday due to her toe she received IV antibiotics and then they switched her to clindamycin from the doxycycline that had her own she has not picked that up yet that should be today. Nonetheless I think that is fine for her to switch over to clindamycin I see no problems in that regard. With that being said the patient continues  to experience some discomfort in regard to the toe but think it looks nearly as bad as what it did previous. 02/09/2021 patient presents for her 1 week follow-up. She has been using Hydrofera Blue to her transmetatarsal wound dehiscence site. She has been using silver alginate to her right second toe and Santyl to her upper right thigh wound burn. She reports stability in her wound sites and has no questions or concerns today. 02/23/2021 on evaluation today patient appears to be doing decently well in regard to her wounds on the left lateral foot as well as the right thigh. Both are making good signs of excellent progression. With that being said I do think that the biggest issue I see is getting the wound on the lateral left foot to improve as far as depth is concerned and I think that we can speed this up with the use of a snap VAC. I believe that this will be the ideal treatment at this point. With that being said the second toe on the right foot is what is most concerning at this point. Unfortunately she has a lot going on here as far as that toe is concerned. It  is draining and unfortunately does not seem to be getting much better despite mupirocin we been using. I think you might want to switch to gentamicin to see if that will do any better for her. 03/02/2021 upon evaluation today patient appears to be doing excellent in regard to her wound over the lateral foot I feel like this is actually starting really feeling nicely. Unfortunately she was denied for the snap VAC but hopefully will on a good path that we will need to get this healed pretty quickly. With regard to her toe on the right foot unfortunately this is still giving her a lot of trouble still erythematous she has been using the topical gentamicin but this has not really taken care of this completely in regard to her burn the Melburn Popper is doing great. Electronic Signature(s) Signed: 03/02/2021 4:55:32 PM By: Lenda Kelp PA-C Entered  By: Lenda Kelp on 03/02/2021 16:55:32 -------------------------------------------------------------------------------- Physical Exam Details Patient Name: Date of Service: [name], [name] 03/02/2021 3:45 PM Medical Record Number: [id number] Patient Account Number: [id number] Date of Birth/Sex: Treating RN: July 29, 1966 (55 y.o. [name] Primary Care Provider: Antony Haste Other Clinician: Referring Provider: Treating Provider/Extender: Nanetta Batty in Treatment: 24 Constitutional Well-nourished and well-hydrated in no acute distress. Respiratory normal breathing without difficulty. Psychiatric this patient is able to make decisions and demonstrates good insight into disease process. Alert and Oriented x 3. pleasant and cooperative. Notes Upon inspection patient's wound bed actually showed signs of good granulation epithelization at this point. There does not appear to be any signs of infection which is great news and overall very pleased with where things stand today. With that being said the right toe which is erythematous and swollen does appear to be the biggest concern currently. Electronic Signature(s) Signed: 03/02/2021 4:56:01 PM By: Lenda Kelp PA-C Entered By: Lenda Kelp on 03/02/2021 16:56:01 -------------------------------------------------------------------------------- Physician Orders Details Patient Name: Date of Service: [name], [name] 03/02/2021 3:45 PM Medical Record Number: [id number] Patient Account Number: [id number] Date of Birth/Sex: Treating RN: Mar 11, 1966 (55 y.o. [name] Primary Care Provider: Antony Haste Other Clinician: Referring Provider: Treating Provider/Extender: Nanetta Batty in Treatment: 24 Verbal / Phone Orders: No Diagnosis Coding ICD-10 Coding Code Description E11.621 Type 2 diabetes mellitus with foot ulcer L97.522 Non-pressure chronic ulcer of other part of  left foot with fat layer exposed T24.311A Burn of third degree of right thigh, initial encounter L97.122 Non-pressure chronic ulcer of left 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 L97.512 Non-pressure chronic ulcer of other part of right foot with fat layer exposed 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 Bathing/ Shower/ Hygiene May shower and wash wound with soap and water. - shower with hibiclens soap weekly Non Wound Condition Other Non Wound Condition Orders/Instructions: - go to emergency room if right toe and foot redness worsens, fever, chills or increased pain Home Health New wound care orders this week; continue Home Health for wound care. May utilize formulary equivalent dressing for wound treatment orders unless otherwise specified. - change to silver collagen on left transmet wound Other Home Health Orders/Instructions: - [facility] Wound Treatment Wound #12 - Upper Leg Wound Laterality: Right Prim Dressing: Santyl Ointment (Home Health) 1 x Per Day/30 Days ary Discharge Instructions: Apply nickel thick amount to wound bed as instructed Secondary Dressing: Zetuvit Plus 4x4 in (Home Health) 1 x Per Day/30 Days Discharge Instructions: Apply  over primary dressing as directed. Wound #13 - Amputation Site - Transmetatarsal Wound Laterality: Left Cleanser: Normal Saline (Home Health) 1 x Per Day/30 Days Discharge Instructions: Cleanse the wound with Normal Saline prior to applying a clean dressing using gauze sponges, not tissue or cotton balls. Prim Dressing: Promogran Prisma Matrix, 4.34 (sq in) (silver collagen) (Home Health) 1 x Per Day/30 Days ary Discharge Instructions: Moisten collagen with saline or hydrogel Secondary Dressing: Woven Gauze Sponges 2x2 in (Home Health) 1 x Per Day/30 Days Discharge Instructions: Apply over primary dressing or may use foam border Secondary  Dressing: Drawtex 4x4 in (Home Health) 1 x Per Day/30 Days Discharge Instructions: Apply over primary dressing, cut to fit inside wound edges to hold collage at base of wound Secured With: Conforming Stretch Gauze Bandage, Sterile 2x75 (in/in) (Home Health) 1 x Per Day/30 Days Discharge Instructions: Secure with stretch gauze as directed. Wound #14 - T Second oe Wound Laterality: Right Topical: Gentamicin (Home Health) 1 x Per Day/15 Days Discharge Instructions: thin layer on wound bed with dressing changes Prim Dressing: KerraCel Ag Gelling Fiber Dressing, 2x2 in (silver alginate) (Home Health) 1 x Per Day/15 Days ary Discharge Instructions: Apply silver alginate to wound bed as instructed Secondary Dressing: Woven Gauze Sponges 2x2 in 1 x Per Day/15 Days Discharge Instructions: Apply over primary dressing as directed. Secured With: Insurance underwriter, Sterile 2x75 (in/in) (Home Health) 1 x Per Day/15 Days Discharge Instructions: Secure with stretch gauze as directed. Secured With: Paper Tape, 2x10 (in/yd) 1 x Per Day/15 Days Discharge Instructions: Secure dressing with tape as directed. Wound #16 - Lower Leg Wound Laterality: Right, Anterior Prim Dressing: Promogran Prisma Matrix, 4.34 (sq in) (silver collagen) ary Discharge Instructions: Moisten collagen with saline or hydrogel Secondary Dressing: Zetuvit Plus 4x4 in Discharge Instructions: Apply over primary dressing as directed. Patient Medications llergies: ibuprofen, penicillin A Notifications Medication Indication Start End 03/02/2021 Bactrim DS DOSE 1 - oral 800 mg-160 mg tablet - 1 tablet oral taken 2 times per day for 14 days. Do not take potassium while on this medication Electronic Signature(s) Signed: 03/02/2021 4:57:54 PM By: Lenda Kelp PA-C Entered By: Lenda Kelp on 03/02/2021 16:57:53 -------------------------------------------------------------------------------- Problem List Details Patient  Name: Date of Service: [name], [name] 03/02/2021 3:45 PM Medical Record Number: [id number] Patient Account Number: [id number] Date of Birth/Sex: Treating RN: [birth year] (55 y.o. [name] Primary Care Provider: Antony Haste Other Clinician: Referring Provider: Treating Provider/Extender: Nanetta Batty in Treatment: 24 Active Problems ICD-10 Encounter Code Description Active Date MDM Diagnosis E11.621 Type 2 diabetes mellitus with foot ulcer 09/15/2020 No Yes L97.522 Non-pressure chronic ulcer of other part of left foot with fat layer exposed 02/23/2021 No Yes T24.311A Burn of third degree of right thigh, initial encounter 01/12/2021 No Yes L97.122 Non-pressure chronic ulcer of left 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 10/08/2020 No Yes L97.512 Non-pressure chronic ulcer of other part of right foot with fat layer exposed 09/15/2020 No Yes M31.31 Wegener's granulomatosis with renal involvement 09/15/2020 No Yes N18.9 Chronic kidney disease, unspecified 09/15/2020 No Yes Inactive Problems Resolved Problems ICD-10 Code Description Active Date Resolved Date L97.812 Non-pressure chronic ulcer of other part of right lower leg with fat layer exposed 09/15/2020 09/15/2020 G81.856 Non-pressure chronic ulcer of other part of left lower leg with fat layer exposed 11/24/2020 11/24/2020 Electronic Signature(s) Signed: 03/02/2021 4:25:10 PM By: Lenda Kelp PA-C Entered By: Lenda Kelp on 03/02/2021 16:25:10 -------------------------------------------------------------------------------- Progress Note Details Patient  Name: Date of Service: [name], [name] 03/02/2021 3:45 PM Medical Record Number: [id number] Patient Account Number: [id number] Date of Birth/Sex: Treating RN: 12-07-1965 (55 y.o. [name] Primary Care Provider: Antony Haste Other Clinician: Referring Provider: Treating Provider/Extender: Nanetta Batty in Treatment:  24 Subjective Chief Complaint Information obtained from Patient Bilateral LE Ulcers and bilateral foot ulcers History of Present Illness (HPI) 09/15/2020 upon evaluation today patient appears to be doing somewhat poorly in regard to the wounds on her feet. She has a wound on the right lateral foot, right medial great toe, right dorsal great toe, left lateral foot, and right lateral lower leg. Currently based on what we are seeing the patient states that the worst issue is her right great toe. She dates that she wishes Dr. Victorino Dike had actually gone ahead and performed amputation at this site as well when he did the other toes they have all healed well that is the right third toe, left great toe, and left third toe. All are completely healed and did excellent. Currently she has been transition into Cam boots for both feet previously she was using postop shoes but apparently this caused some rubbing on the lateral aspects of both feet and at the medial aspect of her right great toe which unfortunately has caused her a lot of discomfort and issues. She has been given mupirocin and betamethasone for some of the small wounds that occur over her legs and arms frequently. 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 Otherwise she also does have diabetes mellitus type 2. Currently the patient is on methotrexate and also undergoes chemotherapy infusions every 6 months for the Wegener's granulomatosis currently her amputations were asked on August 12, 2020 that was with Dr. Victorino Dike. His physician assistant Jill Alexanders referred her to me today. 09/22/2020 upon evaluation today patient appears to be making good progress with the Santyl at this point. I am extremely pleased with where things stand today and I do believe him to be able to debride some of the left lateral foot wound which we have not  been able to do as the eschar was too thick. I think this is doing much better currently. 09/29/2020 upon evaluation today patient appears to be doing decently well in regard to her ulcers on her feet. I do believe the Santyl has been of benefit. With that being said I am concerned about the fact that she continues to have wounds over her arms and legs bilaterally which seem to continually be an issue. Fortunately there is no signs of active infection at this time but again I am not really sure where these are coming from. She is seeing dermatology they had suggested this was pyoderma but that seems kind of unusual based on how they seem to come up. She has been using a mix of steroid and mupirocin over these areas. 12/17; this is a patient called in urgently to be seen. She was apparently in the shower washing the wound on the left lateral fifth metatarsal head and noticed a hole present. She also complained of increasing pain.No systemic signs or symptoms. 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 She says her wound started from friction with Cam boots that she had after amputation of three toes two on the left and one on the right 10/13/2020 upon evaluation today  patient appears to be doing really about the same in regard to her feet. She is scheduled for amputation surgery on the 30th of this month December 2021. 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 With that being said she does seem somewhat a little down about everything going on obviously she was hopeful to try to get the wound is healed things just have not progressed as we were hoping. Fortunately there is no signs of active infection at this time. No fevers, chills, nausea, vomiting, or diarrhea. She did have evidence of MRSA on culture but again right now I think that really something that should be handled by her  infectious disease doctor who she will be seeing tomorrow. 11/24/2020 patient presents for reevaluation here in our clinic after having had surgery with Dr. Victorino DikeHewitt on 10/21/2020. 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 The patient also had percutaneous left Achilles tendon lengthening. Subsequently she has done fairly well with regard to these surgeries. The right great toe has healed nicely the left transmetatarsal amputation has mostly healed although there is a small area that looks like it may be attempting to dehisce. With that being said Dr. Victorino DikeHewitt still has the sutures in place I did not do anything with this wound today though the patient wanted me to look at it. I explained that I do think Dr. Victorino DikeHewitt needs to remove the sutures when he thinks that is appropriate and subsequently if we need to help with trying to get it healed if there is a small dehiscence I will be more than happy to aid in that regard. All this was discussed with the patient during the office visit today. 01/12/2021 upon evaluation today patient appears to be doing actually fairly well considering the last time I saw her things were much more significant. Fortunately there does not appear to be any evidence of infection at any site. Mainly the open areas that she has right now is a burn which is 1/3 degree burn on her right thigh which occurred as a result of a curling iron injury. This is more recent and just the past several days. Fortunately there does not appear to be any evidence of infection right now which is great news. I do believe we need to try to help clean this up I think Santyl would do quite well. She does tell me that she needs to have treatment for her Wegener's granulomatosis. They have been holding off on that however due to the fact of the concerned about her healing. At this point I think if she needs to have the treatment then from a rheumatology  standpoint if they feel that is necessary than she would just have to proceed with that and will have to do what we can as far as helping with healing is concerned. 01/19/2021 on evaluation today patient appears to be doing decently well in regard to her wounds. The Santyl is loosening up the necrotic tissue on the surface of the burn on her thigh. Fortunately that does not appear to be infected. 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 01/26/2021 upon evaluation today patient appears to be doing well in regard to her amputation site on the left. This is a left foot transmetatarsal region.  With that being said unfortunately her toe on the right foot is showing signs of being more erythematous and swollen today. I think that we can probably need to put her on a stronger oral antibiotic she has been taking Bactrim 3 times a week but obviously that is not doing the job. 02/02/2021 upon evaluation today patient appears to be doing well with regard to her wounds in general. I feel like everything is actually showing signs of some good improvement. She did go to the ER on Monday due to her toe she received IV antibiotics and then they switched her to clindamycin from the doxycycline that had her own she has not picked that up yet that should be today. Nonetheless I think that is fine for her to switch over to clindamycin I see no problems in that regard. With that being said the patient continues to experience some discomfort in regard to the toe but think it looks nearly as bad as what it did previous. 02/09/2021 patient presents for her 1 week follow-up. She has been using Hydrofera Blue to her transmetatarsal wound dehiscence site. She has been using silver alginate to her right second toe and Santyl to her upper right thigh wound burn. She reports stability in her wound sites and has no  questions or concerns today. 02/23/2021 on evaluation today patient appears to be doing decently well in regard to her wounds on the left lateral foot as well as the right thigh. Both are making good signs of excellent progression. With that being said I do think that the biggest issue I see is getting the wound on the lateral left foot to improve as far as depth is concerned and I think that we can speed this up with the use of a snap VAC. I believe that this will be the ideal treatment at this point. With that being said the second toe on the right foot is what is most concerning at this point. Unfortunately she has a lot going on here as far as that toe is concerned. It is draining and unfortunately does not seem to be getting much better despite mupirocin we been using. I think you might want to switch to gentamicin to see if that will do any better for her. 03/02/2021 upon evaluation today patient appears to be doing excellent in regard to her wound over the lateral foot I feel like this is actually starting really feeling nicely. Unfortunately she was denied for the snap VAC but hopefully will on a good path that we will need to get this healed pretty quickly. With regard to her toe on the right foot unfortunately this is still giving her a lot of trouble still erythematous she has been using the topical gentamicin but this has not really taken care of this completely in regard to her burn the Melburn Popper is doing great. Objective Constitutional Well-nourished and well-hydrated in no acute distress. Vitals Time Taken: 3:55 PM, Height: 66 in, Weight: 195 lbs, BMI: 31.5, Temperature: 98.7 F, Pulse: 78 bpm, Respiratory Rate: 16 breaths/min, Blood Pressure: 91/65 mmHg, Capillary Blood Glucose: 107 mg/dl. Respiratory normal breathing without difficulty. Psychiatric this patient is able to make decisions and demonstrates good insight into disease process. Alert and Oriented x 3. pleasant and  cooperative. General Notes: Upon inspection patient's wound bed actually showed signs of good granulation epithelization at this point. There does not appear to be any signs of infection which is great news and overall very pleased with where things  stand today. With that being said the right toe which is erythematous and swollen does appear to be the biggest concern currently. Integumentary (Hair, Skin) Wound #12 status is Open. Original cause of wound was Thermal Burn. The date acquired was: 12/29/2020. The wound has been in treatment 7 weeks. The wound is located on the Right Upper Leg. The wound measures 1cm length x 3cm width x 0.3cm depth; 2.356cm^2 area and 0.707cm^3 volume. There is Fat Layer (Subcutaneous Tissue) exposed. There is no tunneling or undermining noted. There is a medium amount of serous drainage noted. The wound margin is distinct with the outline attached to the wound base. There is medium (34-66%) red granulation within the wound bed. There is a medium (34-66%) amount of necrotic tissue within the wound bed including Adherent Slough. Wound #13 status is Open. Original cause of wound was Surgical Injury. The date acquired was: 10/11/2020. The wound has been in treatment 7 weeks. 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 The wound measures 1.2cm length x 0.4cm width x 0.6cm depth; 0.377cm^2 area and 0.226cm^3 volume. There is Fat Layer (Subcutaneous Tissue) exposed. There is no undermining noted, however, there is tunneling at 3:00 with a maximum distance of 0.6cm. There is a medium amount of serosanguineous drainage noted. The wound margin is thickened. There is large (67-100%) pink granulation within the wound bed. There is a small (1-33%) amount of necrotic tissue within the wound bed including Adherent Slough. Wound #14 status is Open. Original cause of wound was Trauma. The date acquired was: 01/17/2021. The wound has been in treatment 5 weeks. The  wound is located on the Right T Second. The wound measures 2cm length x 2.2cm width x 0.1cm depth; 3.456cm^2 area and 0.346cm^3 volume. There is Fat Layer oe (Subcutaneous Tissue) exposed. There is no tunneling or undermining noted. There is a medium amount of serosanguineous drainage noted. The wound margin is flat and intact. There is large (67-100%) pink granulation within the wound bed. There is no necrotic tissue within the wound bed. Wound #16 status is Open. Original cause of wound was Gradually Appeared. The date acquired was: 03/02/2021. The wound is located on the Right,Anterior Lower Leg. The wound measures 1cm length x 0.9cm width x 0.1cm depth; 0.707cm^2 area and 0.071cm^3 volume. There is Fat Layer (Subcutaneous Tissue) exposed. There is no tunneling or undermining noted. There is a small amount of serosanguineous drainage noted. The wound margin is distinct with the outline attached to the wound base. There is large (67-100%) pink granulation within the wound bed. There is no necrotic tissue within the wound bed. 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 There was a Chemical/Enzymatic/Mechanical debridement performed by Fonnie Mu, RN.Marland Kitchen Agent used was [employer]. There was no bleeding. The procedure was tolerated well. Post Debridement Measurements: 1cm length x 3cm width x 0.1cm depth; 0.236cm^3 volume. Character of Wound/Ulcer Post Debridement requires further debridement. Post  procedure Diagnosis Wound #12: Same as Pre-Procedure Plan Follow-up Appointments: Return Appointment in 1 week. Bathing/ Shower/ Hygiene: May  shower and wash wound with soap and water. - shower with hibiclens soap weekly Non Wound Condition: Other Non Wound Condition Orders/Instructions: - go to emergency room if right toe and foot redness worsens, fever, chills or increased pain Home Health: New wound care orders this week; continue Home Health for wound care. May utilize formulary equivalent dressing for wound treatment orders unless otherwise specified. - change to silver collagen on left transmet wound Other Home Health Orders/Instructions: Frances Furbish The following medication(s) was prescribed: Bactrim DS oral 800 mg-160 mg tablet 1 1 tablet oral taken 2 times per day for 14 days. Do not take potassium while on this medication starting 03/02/2021 WOUND #12: - Upper Leg Wound Laterality: Right Prim Dressing: Santyl Ointment (Home Health) 1 x Per Day/30 Days ary Discharge Instructions: Apply nickel thick amount to wound bed as instructed Secondary Dressing: Zetuvit Plus 4x4 in (Home Health) 1 x Per Day/30 Days Discharge Instructions: Apply over primary dressing as directed. WOUND #13: - Amputation Site - Transmetatarsal Wound Laterality: Left Cleanser: Normal Saline (Home Health) 1 x Per Day/30 Days Discharge Instructions: Cleanse the wound with Normal Saline prior to applying a clean dressing using gauze sponges, not tissue or cotton balls. Prim Dressing: Promogran Prisma Matrix, 4.34 (sq in) (silver collagen) (Home Health) 1 x Per Day/30 Days ary Discharge Instructions: Moisten collagen with saline or hydrogel Secondary Dressing: Woven Gauze Sponges 2x2 in (Home Health) 1 x Per Day/30 Days Discharge Instructions: Apply over primary dressing or may use foam border Secondary Dressing: Drawtex 4x4 in (Home Health) 1 x Per Day/30 Days Discharge Instructions: Apply over primary dressing, cut  to fit inside wound edges to hold collage at base of wound Secured With: Conforming Stretch Gauze Bandage, Sterile 2x75 (in/in) (Home Health) 1 x Per Day/30 Days Discharge Instructions: Secure with stretch gauze as directed. WOUND #14: - T Second Wound Laterality: Right oe Topical: Gentamicin (Home Health) 1 x Per Day/15 Days Discharge Instructions: thin layer on wound bed with dressing changes Prim Dressing: KerraCel Ag Gelling Fiber Dressing, 2x2 in (silver alginate) (Home Health) 1 x Per Day/15 Days ary Discharge Instructions: Apply silver alginate to wound bed as instructed Secondary Dressing: Woven Gauze Sponges 2x2 in 1 x Per Day/15 Days Discharge Instructions: Apply over primary dressing as directed. Secured With: Insurance underwriter, Sterile 2x75 (in/in) (Home Health) 1 x Per Day/15 Days Discharge Instructions: Secure with stretch gauze as directed. Secured With: Paper T ape, 2x10 (in/yd) 1 x Per Day/15 Days Discharge Instructions: Secure dressing with tape as directed. WOUND #16: - Lower Leg Wound Laterality: Right, Anterior Prim Dressing: Promogran Prisma Matrix, 4.34 (sq in) (silver collagen) ary Discharge Instructions: Moisten collagen with saline or hydrogel Secondary Dressing: Zetuvit Plus 4x4 in Discharge Instructions: Apply over primary dressing as directed. 1. Would recommend currently that we go ahead and initiate treatment with a initiation of an antibiotic I would recommend likely Bactrim DS being the best option based on what I am seeing as a good broad-spectrum antibiotic. The patient is in agreement with that plan. 2. I am also can recommend that we continue with the Santyl for the burn on her thigh this is doing quite well. 3. I am also going to suggest that the patient continue to monitor for any signs of worsening in general regard to her left lateral foot that seems to be doing quite well we can switch to collagen followed by drawtex at this time. 4.  With the toe on the right foot  we will continue with the gentamicin I think this is good as anything at this point. We will see patient back for reevaluation in 1 week here in the clinic. If anything worsens or changes patient will contact our office for additional recommendations. Electronic Signature(s) Signed: 03/02/2021 4:58:29 PM By: Lenda Kelp PA-C Entered By: Lenda Kelp on 03/02/2021 16:58:28 -------------------------------------------------------------------------------- SuperBill Details Patient Name: Date of Service: [name], [name] 03/02/2021 Medical Record Number: [id number] Patient Account Number: [id number] Date of Birth/Sex: Treating RN: [birth year] (55 y.o. [name] Primary Care Provider: Antony Haste Other Clinician: Referring Provider: Treating Provider/Extender: Nanetta Batty in Treatment: 24 Diagnosis Coding ICD-10 Codes Code Description [phone] Type 2 diabetes mellitus with foot ulcer L97.522 Non-pressure chronic ulcer of other part of left foot with fat layer exposed T24.311A Burn of third degree of right thigh, initial encounter L97.122 Non-pressure chronic ulcer of left 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 L97.512 Non-pressure chronic ulcer of other part of right foot with fat layer exposed M31.31 Wegener's granulomatosis with renal involvement N18.9 Chronic kidney disease, unspecified Facility Procedures CPT4 Code: [id number] Description: [phone] - DEBRIDE W/O ANES NON SELECT Modifier: Quantity: 1 Physician Procedures : CPT4 Code Description Modifier [id number] - WC PHYS LEVEL 4 - EST PT ICD-10 Diagnosis Description E11.621 Type 2 diabetes mellitus with foot ulcer L97.522 Non-pressure chronic ulcer of other part of left foot with fat layer exposed T24.311A Burn of  third degree of right thigh, initial encounter L97.122 Non-pressure chronic ulcer of left 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 Quantity: 1 Electronic  Signature(s) Signed: 03/02/2021 4:59:49 PM By: Lenda Kelp PA-C Entered By: Lenda Kelp on 03/02/2021 16:59:49

## 2021-03-02 NOTE — Progress Notes (Signed)
NASHA, DISS ([id number]) Visit Report for 03/02/2021 Arrival Information Details Patient Name: Date of Service: [name], [name] 03/02/2021 3:45 PM Medical Record Number: [id number] Patient Account Number: [id number] Date of Birth/Sex: Treating RN: 1966-07-18 (55 y.o. [name], [name] Primary Care Shavonta Gossen: Antony Haste Other Clinician: Referring Kana Reimann: Treating Kashaun Bebo/Extender: Nanetta Batty in Treatment: 24 Visit Information History Since Last Visit Added or deleted any medications: No Patient Arrived: Wheel Chair Any new allergies or adverse reactions: No Arrival Time: 15:55 Had a fall or experienced change in No Accompanied By: husband activities of daily living that may affect Transfer Assistance: None risk of falls: Patient Identification Verified: Yes Signs or symptoms of abuse/neglect since last visito No Secondary Verification Process Completed: Yes Hospitalized since last visit: No Patient Requires Transmission-Based Precautions: No Implantable device outside of the clinic excluding No Patient Has Alerts: No cellular tissue based products placed in the center since last visit: Has Dressing in Place as Prescribed: Yes Pain Present Now: No Electronic Signature(s) Signed: 03/02/2021 5:30:57 PM By: [name] Entered By: [name] on 03/02/2021 16:10:51 -------------------------------------------------------------------------------- Encounter Discharge Information Details Patient Name: Date of Service: [name], [name] 03/02/2021 3:45 PM Medical Record Number: [id number] Patient Account Number: [id number] Date of Birth/Sex: Treating RN: 03-10-1966 (55 y.o. [name], [name] Primary Care Temesgen Weightman: Antony Haste Other Clinician: Referring Wileen Duncanson: Treating Yalda Herd/Extender: Nanetta Batty in Treatment: 24 Encounter Discharge Information Items Post Procedure Vitals Discharge Condition: Stable Temperature (F):  97.8 Ambulatory Status: Wheelchair Pulse (bpm): 74 Discharge Destination: Home Respiratory Rate (breaths/min): 17 Transportation: Private Auto Blood Pressure (mmHg): 127/74 Accompanied By: husband Schedule Follow-up Appointment: Yes Clinical Summary of Care: Patient Declined Electronic Signature(s) Signed: 03/02/2021 5:14:45 PM By: Fonnie Mu RN Entered By: Fonnie Mu on 03/02/2021 16:59:47 -------------------------------------------------------------------------------- Lower Extremity Assessment Details Patient Name: Date of Service: [name], [name] 03/02/2021 3:45 PM Medical Record Number: [id number] Patient Account Number: [id number] Date of Birth/Sex: Treating RN: 08-03-1966 (55 y.o. [name] Primary Care Ariona Deschene: Antony Haste Other Clinician: Referring Refugio Mcconico: Treating Aaronjames Kelsay/Extender: Nanetta Batty in Treatment: 24 Edema Assessment Assessed: Kyra Searles: Yes] Franne Forts: Yes] Edema: [Left: No] [Right: No] Calf Left: Right: Point of Measurement: 30 cm From Medial Instep 39.5 cm 39.5 cm Ankle Left: Right: Point of Measurement: 9 cm From Medial Instep 23 cm 23 cm Vascular Assessment Pulses: Dorsalis Pedis Palpable: [Left:Yes] [Right:Yes] Electronic Signature(s) Signed: 03/02/2021 5:30:57 PM By: [name] Entered By: [name] on 03/02/2021 16:13:26 -------------------------------------------------------------------------------- Multi-Disciplinary Care Plan Details Patient Name: Date of Service: [name], [name] 03/02/2021 3:45 PM Medical Record Number: [id number] Patient Account Number: [id number] Date of Birth/Sex: Treating RN: 08-17-1966 (55 y.o. [name] Primary Care Sydna Brodowski: Antony Haste Other Clinician: Referring Chanci Ojala: Treating Lilybelle Mayeda/Extender: Nanetta Batty in Treatment: 24 Multidisciplinary Care Plan reviewed with physician Active Inactive Wound/Skin  Impairment Nursing Diagnoses: Impaired tissue integrity Knowledge deficit related to ulceration/compromised skin integrity Goals: Patient/caregiver will verbalize understanding of skin care regimen Date Initiated: 09/15/2020 Target Resolution Date: 03/09/2021 Goal Status: Active Ulcer/skin breakdown will have a volume reduction of 30% by week 4 Date Initiated: 09/15/2020 Date Inactivated: 10/13/2020 Target Resolution Date: 10/13/2020 Goal Status: Unmet Unmet Reason: infection Interventions: Assess patient/caregiver ability to obtain necessary supplies Assess patient/caregiver ability to perform ulcer/skin care regimen upon admission and as needed Assess ulceration(s) every visit Provide education on ulcer and skin care Treatment Activities: Skin care regimen initiated : 09/15/2020 Topical wound management initiated : 09/15/2020 Notes:  Electronic Signature(s) Signed: 03/02/2021 6:13:52 PM By: Zenaida DeedBoehlein, Linda RN, BSN Entered By: Zenaida DeedBoehlein, Linda on 03/02/2021 16:37:35 -------------------------------------------------------------------------------- Pain Assessment Details Patient Name: Date of Service: [name], [name] 03/02/2021 3:45 PM Medical Record Number: [id number] Patient Account Number: [id number] Date of Birth/Sex: Treating RN: 1965-11-29 (55 y.o. Arta SilenceF) Deaton, Bobbi Primary Care Sriman Tally: Antony HasteBadger, Michael Other Clinician: Referring Mykael Batz: Treating Clifford Coudriet/Extender: Nanetta BattyStone III, Hoyt Badger, Michael [name]: 24 Active Problems Location of Pain Severity and Description of Pain Patient Has Paino No Site Locations Rate the pain. Current Pain Level: 0 Pain Management and Medication Current Pain Management: Medication: No Cold Application: No Rest: No Massage: No Activity: No T.E.N.S.: No Heat Application: No Leg drop or elevation: No Is the Current Pain Management Adequate: Adequate How does your wound impact your activities of daily livingo Sleep:  No Bathing: No Appetite: No Relationship With Others: No Bladder Continence: No Emotions: No Bowel Continence: No Work: No Toileting: No Drive: No Dressing: No Hobbies: No Electronic Signature(s) Signed: 03/02/2021 5:30:57 PM By: Shawn Stalleaton, Bobbi Entered By: Shawn Stalleaton, Bobbi on 03/02/2021 16:12:21 -------------------------------------------------------------------------------- Patient/Caregiver Education Details Patient Name: Date of Service: [name], [name] 5/11/2022andnbsp3:45 PM Medical Record Number: [id number] Patient Account Number: [id number] Date of Birth/Gender: Treating RN: 1965-11-29 (55 y.o. Tommye StandardF) Boehlein, Linda Primary Care Physician: Antony HasteBadger, Michael Other Clinician: Referring Physician: Treating Physician/Extender: Nanetta BattyStone III, Hoyt Badger, Michael [name]: 24 Education Assessment Education Provided To: Patient Education Topics Provided Venous: Wound/Skin Impairment: Methods: Explain/Verbal Responses: Reinforcements needed, State content correctly [employer]-Finch CompanyElectronic Signature(s) Signed: 03/02/2021 6:13:52 PM By: Zenaida DeedBoehlein, Linda RN, BSN Entered By: Zenaida DeedBoehlein, Linda on 03/02/2021 16:38:02 -------------------------------------------------------------------------------- Wound Assessment Details Patient Name: Date of Service: [name], [name] 03/02/2021 3:45 PM Medical Record Number: [id number] Patient Account Number: [id number] Date of Birth/Sex: Treating RN: 1965-11-29 (55 y.o. Arta SilenceF) Deaton, Bobbi Primary Care Aylana Hirschfeld: Antony HasteBadger, Michael Other Clinician: Referring Valleri Hendricksen: Treating Tristian Bouska/Extender: Nanetta BattyStone III, Hoyt Badger, Michael [name]: 24 Wound Status Wound Number: 12 Primary 3rd degree Burn Etiology: Wound Location: Right Upper Leg Wound Open Wounding Event: Thermal Burn Status: Date Acquired: 12/29/2020 Comorbid Anemia, Sleep Apnea, Hypotension, Type II Diabetes, Weeks Of Treatment: 7 History: Osteoarthritis, Neuropathy, Received  Chemotherapy Clustered Wound: No Photos Wound Measurements Length: (cm) 1 Width: (cm) 3 Depth: (cm) 0.3 Area: (cm) 2.356 Volume: (cm) 0.707 % Reduction in Area: 81.1% % Reduction in Volume: 71.6% Epithelialization: Medium (34-66%) Tunneling: No Undermining: No Wound Description Classification: Full Thickness Without Exposed Support Structures Wound Margin: Distinct, outline attached Exudate Amount: Medium Exudate Type: Serous Exudate Color: amber Foul Odor After Cleansing: No Slough/Fibrino Yes Wound Bed Granulation Amount: Medium (34-66%) Exposed Structure Granulation Quality: Red Fascia Exposed: No Necrotic Amount: Medium (34-66%) Fat Layer (Subcutaneous Tissue) Exposed: Yes Necrotic Quality: Adherent Slough Tendon Exposed: No Muscle Exposed: No Joint Exposed: No Bone Exposed: No Treatment Notes Wound #12 (Upper Leg) Wound Laterality: Right Cleanser Peri-Wound Care Topical Primary Dressing Santyl Ointment Discharge Instruction: Apply nickel thick amount to wound bed as instructed Secondary Dressing Zetuvit Plus 4x4 in Discharge Instruction: Apply over primary dressing as directed. Secured With Compression Wrap Compression Stockings Facilities managerAdd-Ons Electronic Signature(s) Signed: 03/02/2021 4:50:41 PM By: Karl Itoawkins, Destiny Signed: 03/02/2021 5:30:57 PM By: Shawn Stalleaton, Bobbi Entered By: Karl Itoawkins, Destiny on 03/02/2021 16:28:30 -------------------------------------------------------------------------------- Wound Assessment Details Patient Name: Date of Service: [name], Maura 03/02/2021 3:45 PM Medical Record Number: [id number] Patient Account Number: [id number] Date of Birth/Sex: Treating RN: 1965-11-29 (55 y.o. Arta SilenceF) Deaton, Bobbi Primary Care Alia Parsley: Antony HasteBadger, Michael Other Clinician: Referring Edwardine Deschepper: Treating Illiana Losurdo/Extender: Linwood DibblesStone III,  Ozzie Hoyle, Casimiro Needle [name]: 24 Wound Status Wound Number: 13 Primary Open Surgical Wound Etiology: Wound  Location: Left Amputation Site - Transmetatarsal Wound Open Wounding Event: Surgical Injury Status: Date Acquired: 10/11/2020 Comorbid Anemia, Sleep Apnea, Hypotension, Type II Diabetes, Weeks Of Treatment: 7 History: Osteoarthritis, Neuropathy, Received Chemotherapy Clustered Wound: No Photos Wound Measurements Length: (cm) 1.2 Width: (cm) 0.4 Depth: (cm) 0.6 Area: (cm) 0.377 Volume: (cm) 0.226 % Reduction in Area: -9% % Reduction in Volume: 18.1% Epithelialization: None Tunneling: Yes Position (o'clock): 3 Maximum Distance: (cm) 0.6 Undermining: No Wound Description Classification: Full Thickness Without Exposed Support Structures Wound Margin: Thickened Exudate Amount: Medium Exudate Type: Serosanguineous Exudate Color: red, brown Foul Odor After Cleansing: No Slough/Fibrino Yes Wound Bed Granulation Amount: Large (67-100%) Exposed Structure Granulation Quality: Pink Fascia Exposed: No Necrotic Amount: Small (1-33%) Fat Layer (Subcutaneous Tissue) Exposed: Yes Necrotic Quality: Adherent Slough Tendon Exposed: No Muscle Exposed: No Joint Exposed: No Bone Exposed: No Treatment Notes Wound #13 (Amputation Site - Transmetatarsal) Wound Laterality: Left Cleanser Normal Saline Discharge Instruction: Cleanse the wound with Normal Saline prior to applying a clean dressing using gauze sponges, not tissue or cotton balls. Peri-Wound Care Topical Primary Dressing Promogran Prisma Matrix, 4.34 (sq in) (silver collagen) Discharge Instruction: Moisten collagen with saline or hydrogel Secondary Dressing Drawtex 4x4 in Discharge Instruction: Apply over primary dressing, cut to fit inside wound edges to hold collage at base of wound Woven Gauze Sponges 2x2 in Discharge Instruction: Apply over primary dressing or may use foam border Secured With Conforming Stretch Gauze Bandage, Sterile 2x75 (in/in) Discharge Instruction: Secure with stretch gauze as  directed. Compression Wrap Compression Stockings Add-Ons Electronic Signature(s) Signed: 03/02/2021 4:50:41 PM By: Karl Ito Signed: 03/02/2021 5:30:57 PM By: [name] Entered By: Karl Ito on 03/02/2021 16:29:21 -------------------------------------------------------------------------------- Wound Assessment Details Patient Name: Date of Service: [name], [name] 03/02/2021 3:45 PM Medical Record Number: [id number] Patient Account Number: [id number] Date of Birth/Sex: Treating RN: [birth year] (54 y.o. [name] Primary Care Jovonta Levit: Antony Haste Other Clinician: Referring Tivis Wherry: Treating Nitin Mckowen/Extender: Nanetta Batty in Treatment: 24 Wound Status Wound Number: 14 Primary Diabetic Wound/Ulcer of the Lower Extremity Etiology: Wound Location: Right T Second oe Wound Open Wounding Event: Trauma Status: Date Acquired: 01/17/2021 Comorbid Anemia, Sleep Apnea, Hypotension, Type II Diabetes, Weeks Of Treatment: 5 History: Osteoarthritis, Neuropathy, Received Chemotherapy Clustered Wound: No Photos Wound Measurements Length: (cm) 2 Width: (cm) 2.2 Depth: (cm) 0.1 Area: (cm) 3.456 Volume: (cm) 0.346 % Reduction in Area: -83.3% % Reduction in Volume: -84% Epithelialization: Medium (34-66%) Tunneling: No Undermining: No Wound Description Classification: Grade 1 Wound Margin: Flat and Intact Exudate Amount: Medium Exudate Type: Serosanguineous Exudate Color: red, brown Foul Odor After Cleansing: No Slough/Fibrino No Wound Bed Granulation Amount: Large (67-100%) Exposed Structure Granulation Quality: Pink Fascia Exposed: No Necrotic Amount: None Present (0%) Fat Layer (Subcutaneous Tissue) Exposed: Yes Tendon Exposed: No Muscle Exposed: No Joint Exposed: No Bone Exposed: No Treatment Notes Wound #14 (Toe Second) Wound Laterality: Right Cleanser Peri-Wound Care Topical Gentamicin Discharge Instruction: thin  layer on wound bed with dressing changes Primary Dressing KerraCel Ag Gelling Fiber Dressing, 2x2 in (silver alginate) Discharge Instruction: Apply silver alginate to wound bed as instructed Secondary Dressing Woven Gauze Sponges 2x2 in Discharge Instruction: Apply over primary dressing as directed. Secured With Conforming Stretch Gauze Bandage, Sterile 2x75 (in/in) Discharge Instruction: Secure with stretch gauze as directed. Paper Tape, 2x10 (in/yd) Discharge Instruction: Secure dressing with tape as directed. Compression  Wrap Compression Stockings Add-Ons Electronic Signature(s) Signed: 03/02/2021 4:50:41 PM By: Karl Ito Signed: 03/02/2021 5:30:57 PM By: [name] Entered By: Karl Ito on 03/02/2021 16:28:56 -------------------------------------------------------------------------------- Wound Assessment Details Patient Name: Date of Service: [name], [name] 03/02/2021 3:45 PM Medical Record Number: [id number] Patient Account Number: [id number] Date of Birth/Sex: Treating RN: 06-19-1966 (55 y.o. [name], [name] Primary Care Naylani Bradner: Antony Haste Other Clinician: Referring Selma Mink: Treating Mikhaila Roh/Extender: Nanetta Batty in Treatment: 24 Wound Status Wound Number: 16 Primary Diabetic Wound/Ulcer of the Lower Extremity Etiology: Wound Location: Right, Anterior Lower Leg Wound Open Wounding Event: Gradually Appeared Status: Date Acquired: 03/02/2021 Comorbid Anemia, Sleep Apnea, Hypotension, Type II Diabetes, Weeks Of Treatment: 0 History: Osteoarthritis, Neuropathy, Received Chemotherapy Clustered Wound: No Photos Wound Measurements Length: (cm) 1 Width: (cm) 0.9 Depth: (cm) 0.1 Area: (cm) 0.707 Volume: (cm) 0.071 % Reduction in Area: 0% % Reduction in Volume: 0% Epithelialization: None Tunneling: No Undermining: No Wound Description Classification: Grade 1 Wound Margin: Distinct, outline attached Exudate  Amount: Small Exudate Type: Serosanguineous Exudate Color: red, brown Foul Odor After Cleansing: No Slough/Fibrino No Wound Bed Granulation Amount: Large (67-100%) Exposed Structure Granulation Quality: Pink Fascia Exposed: No Necrotic Amount: None Present (0%) Fat Layer (Subcutaneous Tissue) Exposed: Yes Tendon Exposed: No Muscle Exposed: No Joint Exposed: No Bone Exposed: No Treatment Notes Wound #16 (Lower Leg) Wound Laterality: Right, Anterior Cleanser Peri-Wound Care Topical Primary Dressing Promogran Prisma Matrix, 4.34 (sq in) (silver collagen) Discharge Instruction: Moisten collagen with saline or hydrogel Secondary Dressing Zetuvit Plus 4x4 in Discharge Instruction: Apply over primary dressing as directed. Secured With Compression Wrap Compression Stockings Facilities manager) Signed: 03/02/2021 4:50:41 PM By: Karl Ito Signed: 03/02/2021 5:30:57 PM By: [name] Entered By: Karl Ito on 03/02/2021 16:29:44 -------------------------------------------------------------------------------- Vitals Details Patient Name: Date of Service: [name], [name] 03/02/2021 3:45 PM Medical Record Number: [id number] Patient Account Number: [id number] Date of Birth/Sex: Treating RN: 1966/03/06 (55 y.o. [name], [name] Primary Care Calyn Rubi: Antony Haste Other Clinician: Referring Annjeanette Sarwar: Treating Michal Strzelecki/Extender: Nanetta Batty in Treatment: 24 Vital Signs Time Taken: 15:55 Temperature (F): 98.7 Height (in): 66 Pulse (bpm): 78 Weight (lbs): 195 Respiratory Rate (breaths/min): 16 Body Mass Index (BMI): 31.5 Blood Pressure (mmHg): 91/65 Capillary Blood Glucose (mg/dl): 875 Reference Range: 80 - 120 mg / dl Electronic Signature(s) Signed: 03/02/2021 5:30:57 PM By: [name] Entered By: [name] on 03/02/2021 16:11:10

## 2021-03-03 ENCOUNTER — Encounter (HOSPITAL_BASED_OUTPATIENT_CLINIC_OR_DEPARTMENT_OTHER): Payer: BC Managed Care – PPO | Admitting: Internal Medicine

## 2021-03-09 ENCOUNTER — Other Ambulatory Visit (HOSPITAL_COMMUNITY): Payer: Self-pay | Admitting: Physician Assistant

## 2021-03-09 ENCOUNTER — Other Ambulatory Visit: Payer: Self-pay

## 2021-03-09 ENCOUNTER — Other Ambulatory Visit: Payer: Self-pay | Admitting: Physician Assistant

## 2021-03-09 ENCOUNTER — Encounter (HOSPITAL_BASED_OUTPATIENT_CLINIC_OR_DEPARTMENT_OTHER): Payer: BC Managed Care – PPO | Admitting: Physician Assistant

## 2021-03-09 DIAGNOSIS — L97512 Non-pressure chronic ulcer of other part of right foot with fat layer exposed: Secondary | ICD-10-CM

## 2021-03-09 DIAGNOSIS — E11621 Type 2 diabetes mellitus with foot ulcer: Secondary | ICD-10-CM | POA: Diagnosis not present

## 2021-03-09 NOTE — Progress Notes (Addendum)
[name], [name] ([id number]) Visit Report for 03/09/2021 Arrival Information Details Patient Name: Date of Service: [name], [name] 03/09/2021 10:30 A M Medical Record Number: [id number] Patient Account Number: [id number] Date of Birth/Sex: Treating RN: Nov 27, 1965 (55 y.o. [name] Primary Care Zubair Lofton: Antony Haste Other Clinician: Referring Govind Furey: Treating Sharla Tankard/Extender: Nanetta Batty in Treatment: 25 Visit Information History Since Last Visit Added or deleted any medications: No Patient Arrived: Ambulatory Any new allergies or adverse reactions: No Arrival Time: 11:10 Had a fall or experienced change in No Accompanied By: father activities of daily living that may affect Transfer Assistance: None risk of falls: Patient Identification Verified: Yes Signs or symptoms of abuse/neglect since last visito No Secondary Verification Process Completed: Yes Hospitalized since last visit: No Patient Requires Transmission-Based Precautions: No Implantable device outside of the clinic excluding No Patient Has Alerts: No cellular tissue based products placed in the center since last visit: Has Dressing in Place as Prescribed: Yes Pain Present Now: Yes Electronic Signature(s) Signed: 03/09/2021 6:30:22 PM By: Zandra Abts RN, BSN Entered By: Zandra Abts on 03/09/2021 11:10:49 -------------------------------------------------------------------------------- Encounter Discharge Information Details Patient Name: Date of Service: [name], [name] 03/09/2021 10:30 A M Medical Record Number: [id number] Patient Account Number: [id number] Date of Birth/Sex: Treating RN: 22-Aug-1966 (55 y.o. [name], [name] Primary Care Alylah Blakney: Antony Haste Other Clinician: Referring Satori Krabill: Treating Aarika Moon/Extender: Nanetta Batty in Treatment: 25 Encounter Discharge Information Items Discharge Condition: Stable Ambulatory Status:  Wheelchair Discharge Destination: Home Transportation: Private Auto Accompanied By: husband Schedule Follow-up Appointment: Yes Clinical Summary of Care: Patient Declined Electronic Signature(s) Signed: 03/09/2021 6:38:21 PM By: Fonnie Mu RN Entered By: Fonnie Mu on 03/09/2021 11:56:43 -------------------------------------------------------------------------------- Lower Extremity Assessment Details Patient Name: Date of Service: [name], [name] 03/09/2021 10:30 A M Medical Record Number: [id number] Patient Account Number: [id number] Date of Birth/Sex: Treating RN: 1966-05-19 (55 y.o. [name] Primary Care [name]: Antony Haste Other Clinician: Referring Aerie Donica: Treating Tejon Gracie/Extender: Nanetta Batty in Treatment: 25 Edema Assessment Assessed: Kyra Searles: No] Franne Forts: No] Edema: [Left: No] [Right: No] Calf Left: Right: Point of Measurement: 30 cm From Medial Instep 39 cm 39.5 cm Ankle Left: Right: Point of Measurement: 9 cm From Medial Instep 23 cm 23 cm Vascular Assessment Pulses: Dorsalis Pedis Palpable: [Left:Yes] [Right:Yes] Electronic Signature(s) Signed: 03/09/2021 6:30:22 PM By: Zandra Abts RN, BSN Entered By: Zandra Abts on 03/09/2021 11:20:35 -------------------------------------------------------------------------------- Multi-Disciplinary Care Plan Details Patient Name: Date of Service: [name], [name] 03/09/2021 10:30 A M Medical Record Number: [id number] Patient Account Number: [id number] Date of Birth/Sex: Treating RN: [birth year] (55 y.o. [name] Primary Care Gaynel Schaafsma: Antony Haste Other Clinician: Referring Joshlynn Alfonzo: Treating Ori Trejos/Extender: Nanetta Batty in Treatment: 25 Multidisciplinary Care Plan reviewed with physician Active Inactive Wound/Skin Impairment Nursing Diagnoses: Impaired tissue integrity Knowledge deficit related to ulceration/compromised  skin integrity Goals: Patient/caregiver will verbalize understanding of skin care regimen Date Initiated: 09/15/2020 Target Resolution Date: 04/06/2021 Goal Status: Active Ulcer/skin breakdown will have a volume reduction of 30% by week 4 Date Initiated: 09/15/2020 Date Inactivated: 10/13/2020 Target Resolution Date: 10/13/2020 Goal Status: Unmet Unmet Reason: infection Interventions: Assess patient/caregiver ability to obtain necessary supplies Assess patient/caregiver ability to perform ulcer/skin care regimen upon admission and as needed Assess ulceration(s) every visit Provide education on ulcer and skin care Treatment Activities: Skin care regimen initiated : 09/15/2020 Topical wound management initiated : 09/15/2020 Notes: Electronic Signature(s) Signed: 03/09/2021 6:10:17 PM By: Zenaida Deed RN,  BSN Entered By: Zenaida DeedBoehlein, Linda on 03/09/2021 11:31:54 -------------------------------------------------------------------------------- Pain Assessment Details Patient Name: Date of Service: [name], [name] 03/09/2021 10:30 A M Medical Record Number: [id number] Patient Account Number: [id number] Date of Birth/Sex: Treating RN: 12-03-1965 (55 y.o. Wynelle LinkF) Lynch, Shatara Primary Care Cheron Coryell: Antony HasteBadger, Michael Other Clinician: Referring Kahlen Morais: Treating Brier Reid/Extender: Nanetta BattyStone III, Hoyt Badger, Michael [name]: 25 Active Problems Location of Pain Severity and Description of Pain Patient Has Paino Yes Site Locations Pain Location: Generalized Pain With Dressing Change: Yes Duration of the Pain. Constant / Intermittento Intermittent Rate the pain. Current Pain Level: 4 Character of Pain Describe the Pain: Dull Pain Management and Medication Current Pain Management: Medication: Yes Cold Application: No Rest: No Massage: No Activity: No T.E.N.S.: No Heat Application: No Leg drop or elevation: No Is the Current Pain Management Adequate: Adequate How does your  wound impact your activities of daily livingo Sleep: No Bathing: No Appetite: No Relationship With Others: No Bladder Continence: No Emotions: No Bowel Continence: No Work: No Toileting: No Drive: No Dressing: No Hobbies: No Psychologist, prison and probation serviceslectronic Signature(s) Signed: 03/09/2021 6:30:22 PM By: Zandra AbtsLynch, Shatara RN, BSN Entered By: Zandra AbtsLynch, Shatara on 03/09/2021 11:12:17 -------------------------------------------------------------------------------- Patient/Caregiver Education Details Patient Name: Date of Service: Stacey FlashHA [name], [name] 5/18/2022andnbsp10:30 A M Medical Record Number: [id number] Patient Account Number: [id number] Date of Birth/Gender: Treating RN: 12-03-1965 (55 y.o. Tommye StandardF) Boehlein, Linda Primary Care Physician: Antony HasteBadger, Michael Other Clinician: Referring Physician: Treating Physician/Extender: Nanetta BattyStone III, Hoyt Badger, Michael [name]: 25 Education Assessment Education Provided To: Patient Education Topics Provided Infection: Methods: Explain/Verbal Responses: Reinforcements needed, State content correctly Wound/Skin Impairment: Methods: Explain/Verbal Responses: Reinforcements needed, State content correctly Electronic Signature(s) Signed: 03/09/2021 6:10:17 PM By: Zenaida DeedBoehlein, Linda RN, BSN Entered By: Zenaida DeedBoehlein, Linda on 03/09/2021 11:32:16 -------------------------------------------------------------------------------- Wound Assessment Details Patient Name: Date of Service: [name], [name] 03/09/2021 10:30 A M Medical Record Number: [id number] Patient Account Number: [id number] Date of Birth/Sex: Treating RN: 12-03-1965 (55 y.o. 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 Etiology: Wound Location: Right Upper Leg Wound Open Wounding Event: Thermal Burn Status: Date Acquired:  12/29/2020 Comorbid Anemia, Sleep Apnea, Hypotension, Type II Diabetes, Weeks Of Treatment: 8 History: Osteoarthritis, Neuropathy, Received Chemotherapy Clustered Wound: No Photos Wound Measurements Length: (cm) 1 Width: (cm) 2.6 Depth: (cm) 0.2 Area: (cm) 2.042 Volume: (cm) 0.408 % Reduction in Area: 83.6% % Reduction in Volume: 83.6% Epithelialization: Medium (34-66%) Tunneling: No Undermining: No Wound Description Classification: Full Thickness Without Exposed Support Structures Wound Margin: Distinct, outline attached Exudate Amount: Medium Exudate Type: Serous Exudate Color: amber Foul Odor After Cleansing: No Slough/Fibrino Yes Wound Bed Granulation Amount: Small (1-33%) Exposed Structure Granulation Quality: Pink Fascia Exposed: No Necrotic Amount: Large (67-100%) Fat Layer (Subcutaneous Tissue) Exposed: Yes Necrotic Quality: Adherent Slough Tendon Exposed: No Muscle Exposed: No Joint Exposed: No Bone Exposed: No Treatment Notes Wound #12 (Upper Leg) Wound Laterality: Right Cleanser Peri-Wound Care Topical Primary Dressing Santyl Ointment Discharge Instruction: Apply nickel thick amount to wound bed as instructed Secondary Dressing Woven Gauze Sponges 2x2 in Discharge Instruction: Apply over primary dressing as directed. Secured With Tegaderm Transparent Film Dressing 4x4.75 (in/in) Discharge Instruction: Secure dressing with Tegaderm as directed. Compression Wrap Compression Stockings Add-Ons Electronic Signature(s) Signed: 03/10/2021 4:57:25 PM By: Karl Itoawkins, Destiny Signed: 03/14/2021 5:27:53 PM By: Zandra AbtsLynch, Shatara RN, BSN Previous Signature: 03/09/2021 6:30:22 PM Version By: Zandra AbtsLynch, Shatara RN, BSN  Entered By: Karl Ito on 03/10/2021 08:52:34 -------------------------------------------------------------------------------- Wound Assessment Details Patient Name: Date of Service: [name], [name] 03/09/2021 10:30 A M Medical Record Number:  [id number] Patient Account Number: [id number] Date of Birth/Sex: Treating RN: 05/27/1966 (55 y.o. [name] Primary Care Maejor Erven: Antony Haste Other Clinician: Referring Rip Hawes: Treating Yarlin Breisch/Extender: Nanetta Batty in Treatment: 25 Wound Status Wound Number: 13 Primary Open Surgical Wound Etiology: Wound Location: Left Amputation Site - Transmetatarsal Wound Open Wounding Event: Surgical Injury Status: Date Acquired: 10/11/2020 Comorbid Anemia, Sleep Apnea, Hypotension, Type II Diabetes, Weeks Of Treatment: 8 History: Osteoarthritis, Neuropathy, Received Chemotherapy Clustered Wound: No Photos Wound Measurements Length: (cm) 0.3 Width: (cm) 0.9 Depth: (cm) 0.8 Area: (cm) 0.212 Volume: (cm) 0.17 % Reduction in Area: 38.7% % Reduction in Volume: 38.4% Epithelialization: None Tunneling: No Undermining: No Wound Description Classification: Full Thickness Without Exposed Support Structures Wound Margin: Thickened Exudate Amount: Medium Exudate Type: Serosanguineous Exudate Color: red, brown Foul Odor After Cleansing: No Slough/Fibrino Yes Wound Bed Granulation Amount: Large (67-100%) Exposed Structure Granulation Quality: Pink Fascia Exposed: No Necrotic Amount: Small (1-33%) Fat Layer (Subcutaneous Tissue) Exposed: Yes Necrotic Quality: Adherent Slough Tendon Exposed: No Muscle Exposed: No Joint Exposed: No Bone Exposed: No Treatment Notes Wound #13 (Amputation Site - Transmetatarsal) Wound Laterality: Left Cleanser Normal Saline Discharge Instruction: Cleanse the wound with Normal Saline prior to applying a clean dressing using gauze sponges, not tissue or cotton balls. Peri-Wound Care Topical Primary Dressing Promogran Prisma Matrix, 4.34 (sq in) (silver collagen) Discharge Instruction: Moisten collagen with saline or hydrogel Secondary Dressing Drawtex 4x4 in Discharge Instruction: Apply over primary dressing,  cut to fit inside wound edges to hold collage at base of wound Woven Gauze Sponges 2x2 in Discharge Instruction: Apply over primary dressing or may use foam border Secured With Conforming Stretch Gauze Bandage, Sterile 2x75 (in/in) Discharge Instruction: Secure with stretch gauze as directed. Compression Wrap Compression Stockings Add-Ons Electronic Signature(s) Signed: 03/10/2021 4:57:25 PM By: Karl Ito Signed: 03/14/2021 5:27:53 PM By: Zandra Abts RN, BSN Previous Signature: 03/09/2021 6:30:22 PM Version By: Zandra Abts RN, BSN Entered By: Karl Ito on 03/10/2021 08:51:06 -------------------------------------------------------------------------------- Wound Assessment Details Patient Name: Date of Service: [name], [name] 03/09/2021 10:30 A M Medical Record Number: [id number] Patient Account Number: [id number] Date of Birth/Sex: Treating RN: 1966/06/27 (55 y.o. [name] Primary Care Bentleigh Stankus: Antony Haste Other Clinician: Referring Antanisha Mohs: Treating Amiah Frohlich/Extender: Nanetta Batty in Treatment: 25 Wound Status Wound Number: 14 Primary Diabetic Wound/Ulcer of the Lower Extremity Etiology: Wound Location: Right T Second oe Wound Open Wounding Event: Trauma Status: Date Acquired: 01/17/2021 Comorbid Anemia, Sleep Apnea, Hypotension, Type II Diabetes, Weeks Of Treatment: 6 History: Osteoarthritis, Neuropathy, Received Chemotherapy Clustered Wound: No Photos Wound Measurements Length: (cm) 2 Width: (cm) 1.8 Depth: (cm) 0.2 Area: (cm) 2.827 Volume: (cm) 0.565 % Reduction in Area: -50% % Reduction in Volume: -200.5% Epithelialization: Medium (34-66%) Tunneling: No Undermining: No Wound Description Classification: Grade 1 Wound Margin: Flat and Intact Exudate Amount: Medium Exudate Type: Serosanguineous Exudate Color: red, brown Foul Odor After Cleansing: No Slough/Fibrino No Wound Bed Granulation Amount:  Large (67-100%) Exposed Structure Granulation Quality: Red, Pink Fascia Exposed: No Necrotic Amount: None Present (0%) Fat Layer (Subcutaneous Tissue) Exposed: Yes Tendon Exposed: No Muscle Exposed: No Joint Exposed: No Bone Exposed: No Treatment Notes Wound #14 (Toe Second) Wound Laterality: Right Cleanser Peri-Wound Care Topical Gentamicin Discharge Instruction: thin layer on wound bed with dressing changes Primary Dressing KerraCel  Ag Gelling Fiber Dressing, 2x2 in (silver alginate) Discharge Instruction: Apply silver alginate to wound bed as instructed Secondary Dressing Woven Gauze Sponges 2x2 in Discharge Instruction: Apply over primary dressing as directed. Secured With Conforming Stretch Gauze Bandage, Sterile 2x75 (in/in) Discharge Instruction: Secure with stretch gauze as directed. Paper Tape, 2x10 (in/yd) Discharge Instruction: Secure dressing with tape as directed. Compression Wrap Compression Stockings Add-Ons Electronic Signature(s) Signed: 03/10/2021 4:57:25 PM By: Karl Ito Signed: 03/14/2021 5:27:53 PM By: Zandra Abts RN, BSN Previous Signature: 03/09/2021 6:30:22 PM Version By: Zandra Abts RN, BSN Entered By: Karl Ito on 03/10/2021 08:51:33 -------------------------------------------------------------------------------- Wound Assessment Details Patient Name: Date of Service: [name], [name] 03/09/2021 10:30 A M Medical Record Number: [id number] Patient Account Number: [id number] Date of Birth/Sex: Treating RN: June 26, 1966 (55 y.o. [name] Primary Care Kiko Ripp: Antony Haste Other Clinician: Referring Annastacia Duba: Treating Momoka Stringfield/Extender: Nanetta Batty in Treatment: 25 Wound Status Wound Number: 16 Primary Diabetic Wound/Ulcer of the Lower Extremity Etiology: Wound Location: Right, Anterior Lower Leg Wound Open Wounding Event: Gradually Appeared Status: Date Acquired: 03/02/2021 Comorbid Anemia,  Sleep Apnea, Hypotension, Type II Diabetes, Weeks Of Treatment: 1 History: Osteoarthritis, Neuropathy, Received Chemotherapy Clustered Wound: No Photos Wound Measurements Length: (cm) 0.8 Width: (cm) 0.8 Depth: (cm) 0.1 Area: (cm) 0.503 Volume: (cm) 0.05 % Reduction in Area: 28.9% % Reduction in Volume: 29.6% Epithelialization: Small (1-33%) Tunneling: No Undermining: No Wound Description Classification: Grade 1 Wound Margin: Distinct, outline attached Exudate Amount: Small Exudate Type: Serosanguineous Exudate Color: red, brown Foul Odor After Cleansing: No Slough/Fibrino Yes Wound Bed Granulation Amount: Small (1-33%) Exposed Structure Granulation Quality: Pink Fascia Exposed: No Necrotic Amount: Large (67-100%) Fat Layer (Subcutaneous Tissue) Exposed: Yes Necrotic Quality: Adherent Slough Tendon Exposed: No Muscle Exposed: No Joint Exposed: No Bone Exposed: No Treatment Notes Wound #16 (Lower Leg) Wound Laterality: Right, Anterior Cleanser Peri-Wound Care Topical Primary Dressing Promogran Prisma Matrix, 4.34 (sq in) (silver collagen) Discharge Instruction: Moisten collagen with saline or hydrogel Secondary Dressing Zetuvit Plus 4x4 in Discharge Instruction: Apply over primary dressing as directed. Secured With Compression Wrap Compression Stockings Facilities manager) Signed: 03/10/2021 4:57:25 PM By: Karl Ito Signed: 03/14/2021 5:27:53 PM By: Zandra Abts RN, BSN Previous Signature: 03/09/2021 6:30:22 PM Version By: Zandra Abts RN, BSN Entered By: Karl Ito on 03/10/2021 08:52:14 -------------------------------------------------------------------------------- Vitals Details Patient Name: Date of Service: [name], [name] 03/09/2021 10:30 A M Medical Record Number: [id number] Patient Account Number: [id number] Date of Birth/Sex: Treating RN: 04/11/1966 (55 y.o. [name] Primary Care Shay Jhaveri: Antony Haste Other  Clinician: Referring Shahrukh Pasch: Treating Jomari Bartnik/Extender: Nanetta Batty in Treatment: 25 Vital Signs Time Taken: 11:11 Temperature (F): 98.0 Height (in): 66 Pulse (bpm): 78 Weight (lbs): 195 Respiratory Rate (breaths/min): 16 Body Mass Index (BMI): 31.5 Blood Pressure (mmHg): 97/65 Capillary Blood Glucose (mg/dl): 983 Reference Range: 80 - 120 mg / dl Notes glucose per pt report Electronic Signature(s) Signed: 03/09/2021 6:30:22 PM By: Zandra Abts RN, BSN Entered By: Zandra Abts on 03/09/2021 11:12:02

## 2021-03-09 NOTE — Progress Notes (Addendum)
[name] ([id number]) Visit Report for 03/09/2021 Chief Complaint Document Details Patient Name: Date of Service: [name], [name] 03/09/2021 10:30 A M Medical Record Number: [id number] Patient Account Number: [id number] Date of Birth/Sex: Treating RN: 1966-03-10 (55 y.o. [name] Primary Care Provider: Antony [name] Other Clinician: Referring Provider: Treating Provider/Extender: [name] in Treatment: 25 Information Obtained from: Patient Chief Complaint Bilateral LE Ulcers and bilateral foot ulcers Electronic Signature(s) Signed: 03/09/2021 11:29:19 AM By: [name] Entered By: [name] on 03/09/2021 11:29:18 -------------------------------------------------------------------------------- HPI Details Patient Name: Date of Service: [name], [name] 03/09/2021 10:30 A M Medical Record Number: [id number] Patient Account Number: [id number] Date of Birth/Sex: Treating RN: Oct 24, 1965 (55 y.o. [name] Primary Care Provider: Antony [name] Other Clinician: Referring Provider: Treating Provider/Extender: [name] in Treatment: 25 History of Present Illness HPI Description: 09/15/2020 upon evaluation [name] patient appears to be doing somewhat poorly in regard to the wounds on her feet. She has a wound on the right lateral foot, right medial great toe, right dorsal great toe, left lateral foot, and right lateral lower leg. Currently based on what we are seeing the patient states that the worst issue is her right great toe. She dates that she wishes [name] had actually gone ahead and performed amputation at this site as well when he did the other toes they have all healed well that is the right third toe, left great toe, and left third toe. All are completely healed and did excellent. Currently she has been transition into Cam boots for both feet previously she was using postop shoes but apparently  this caused some rubbing on the lateral aspects of both feet and at the medial aspect of her right great toe which unfortunately has caused her a lot of discomfort and issues. She has been given mupirocin and betamethasone for some of the small wounds that occur over her legs and arms frequently. 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 Otherwise she also does have diabetes mellitus type 2. Currently the patient is on methotrexate and also undergoes chemotherapy infusions every 6 months for the Wegener's granulomatosis currently her amputations were asked on August 12, 2020 that was with [name]. His physician assistant [name]. 09/22/2020 upon evaluation [name] patient appears to be making good progress with the Santyl at this point. I am extremely pleased with where things stand [name] and I do believe him to be able to debride some of the left lateral foot wound which we have not been able to do as the eschar was too thick. I think this is doing much better currently. 09/29/2020 upon evaluation [name] patient appears to be doing decently well in regard to her ulcers on her feet. I do believe the Santyl has been of benefit. With that being said I am concerned about the fact that she continues to have wounds over her arms and legs bilaterally which seem to continually be an issue. Fortunately there is no signs of active infection at this time but again I am not really sure where these are coming from. She is seeing dermatology they had suggested this was pyoderma but that seems kind of unusual based on how they seem to come up. She has been using a mix of steroid and mupirocin over these areas. 12/17; this is  a patient called in urgently to be seen. She was apparently in the shower washing the wound on the left lateral fifth metatarsal head and noticed  a hole present. She also complained of increasing pain.No systemic signs or symptoms. 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 She says her wound started from friction with Cam boots that she had after amputation of three toes two on the left and one on the right 10/13/2020 upon evaluation [name] patient appears to be doing really about the same in regard to her feet. She is scheduled for amputation surgery on the 30th of this month December 2021. 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 With that being said she does seem somewhat a little down about everything going on obviously she was hopeful to try to get the wound is healed things just have not progressed as we were hoping. Fortunately there is no signs of active infection at this time. No fevers, chills, nausea, vomiting, or diarrhea. 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 11/24/2020 patient presents for reevaluation here in our clinic after having had surgery with [name] on 10/21/2020. 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 The patient also had percutaneous left Achilles tendon lengthening. Subsequently she has done fairly well with regard to these surgeries. The right great toe has healed nicely the left transmetatarsal amputation has mostly healed although there is a small area that looks like it may be attempting to dehisce. With that being said [name] still has the sutures in place I did not do anything with this wound [name] though the patient wanted me to look at it. I explained that I do think [name] needs to remove the sutures when he thinks that is appropriate and subsequently if we need to help with trying to  get it healed if there is a small dehiscence I will be more than happy to aid in that regard. All this was discussed with the patient during the office visit [name]. 01/12/2021 upon evaluation [name] patient appears to be doing actually fairly well considering the last time I saw her things were much more significant. Fortunately there does not appear to be any evidence of infection at any site. Mainly the open areas that she has right now is a burn which is 1/3 degree burn on her right thigh which occurred as a result of a curling iron injury. This is more recent and just the past several days. Fortunately there does not appear to be any evidence of infection right now which is great news. I do believe we need to try to help clean this up I think Santyl would do quite well. She does tell me that she needs to have treatment for her Wegener's granulomatosis. They have been holding off on that however due to the fact of the concerned about her healing. At this point I think if she needs to have the treatment then from a rheumatology standpoint if they feel that is necessary than she would just have to proceed with that and will have to do what we can as far as helping with healing is concerned. 01/19/2021 on evaluation [name] patient appears to be doing decently well in  regard to her wounds. The Santyl is loosening up the necrotic tissue on the surface of the burn on her thigh. Fortunately that does not appear to be infected. 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 01/26/2021 upon evaluation [name] patient appears to be doing well in regard to her amputation site on the left. This is a left foot transmetatarsal region. With that being said unfortunately her toe on the right foot is showing signs of being more erythematous and swollen [name]. I think that we can probably need to put her on a  stronger oral antibiotic she has been taking Bactrim 3 times a week but obviously that is not doing the job. 02/02/2021 upon evaluation [name] patient appears to be doing well with regard to her wounds in general. I feel like everything is actually showing signs of some good improvement. She did go to the ER on Monday due to her toe she received IV antibiotics and then they switched her to clindamycin from the doxycycline that had her own she has not picked that up yet that should be [name]. Nonetheless I think that is fine for her to switch over to clindamycin I see no problems in that regard. With that being said the patient continues to experience some discomfort in regard to the toe but think it looks nearly as bad as what it did previous. 02/09/2021 patient presents for her 1 week follow-up. She has been using Hydrofera Blue to her transmetatarsal wound dehiscence site. She has been using silver alginate to her right second toe and Santyl to her upper right thigh wound burn. She reports stability in her wound sites and has no questions or concerns [name]. 02/23/2021 on evaluation [name] patient appears to be doing decently well in regard to her wounds on the left lateral foot as well as the right thigh. Both are making good signs of excellent progression. With that being said I do think that the biggest issue I see is getting the wound on the lateral left foot to improve as far as depth is concerned and I think that we can speed this up with the use of a snap VAC. I believe that this will be the ideal treatment at this point. With that being said the second toe on the right foot is what is most concerning at this point. Unfortunately she has a lot going on here as far as that toe is concerned. It is draining and unfortunately does not seem to be getting much better despite mupirocin we been using. I think you might want to switch to gentamicin to see if that will do any better for her. 03/02/2021 upon  evaluation [name] patient appears to be doing excellent in regard to her wound over the lateral foot I feel like this is actually starting really feeling nicely. Unfortunately she was denied for the snap VAC but hopefully will on a good path that we will need to get this healed pretty quickly. With regard to her toe on the right foot unfortunately this is still giving her a lot of trouble still erythematous she has been using the topical gentamicin but this has not really taken care of this completely in regard to her burn the Melburn Popper is doing great. 03/09/2021 upon evaluation [name] patient in general seems to be doing better for the most part across the board. With that being  said the wound Got is that her toe on the right foot that has been infected still does not appear to be doing excellent at this point at all unfortunately. There does not appear to be signs of active infection systemically which is great news overall very pleased with where things stand [name]. No fevers, chills, nausea, vomiting, or diarrhea. Electronic Signature(s) Signed: 03/09/2021 12:15:27 PM By: [name] Entered By: [name] on 03/09/2021 12:15:27 -------------------------------------------------------------------------------- Dressings and/or debridement of burns; small Details Patient Name: Date of Service: [name], [name] 03/09/2021 10:30 A M Medical Record Number: [id number] Patient Account Number: [id number] Date of Birth/Sex: Treating RN: Nov 26, 1965 (55 y.o. [name] Primary Care Provider: Antony [name] Other Clinician: Referring Provider: Treating Provider/Extender: [name] in Treatment: 25 Procedure Performed for: Wound #12 Right Upper Leg Performed By: Physician Stacey Kelp, PA Post Procedure Diagnosis Same as Pre-procedure Notes using santyl Electronic Signature(s) Signed: 03/09/2021 1:22:22 PM By: [name] Signed: 03/09/2021 6:10:17  PM By: Zenaida Deed RN, BSN Entered By: Zenaida Deed on 03/09/2021 11:37:33 -------------------------------------------------------------------------------- Physical Exam Details Patient Name: Date of Service: [name], [name] 03/09/2021 10:30 A M Medical Record Number: [id number] Patient Account Number: [id number] Date of Birth/Sex: Treating RN: 1966-04-27 (55 y.o. [name] Primary Care Provider: Antony [name] Other Clinician: Referring Provider: Treating Provider/Extender: [name] in Treatment: 25 Constitutional Well-nourished and well-hydrated in no acute distress. Respiratory normal breathing without difficulty. Psychiatric this patient is able to make decisions and demonstrates good insight into disease process. Alert and Oriented x 3. pleasant and cooperative. Notes Upon inspection patient's wound bed actually showed signs of good granulation epithelization at this point. I do think that overall she is doing excellent with the 1 caveat being her right foot second toe which is still showing signs of erythema warmth and swelling I am concerned that this unfortunately just does not appear to be improving the way I would like to see it improved at this point. Electronic Signature(s) Signed: 03/09/2021 12:15:41 PM By: [name] Entered By: [name] on 03/09/2021 12:15:41 -------------------------------------------------------------------------------- Physician Orders Details Patient Name: Date of Service: [name], [name] 03/09/2021 10:30 A M Medical Record Number: [id number] Patient Account Number: [id number] Date of Birth/Sex: Treating RN: 1966-06-09 (55 y.o. [name] Primary Care Provider: Antony [name] Other Clinician: Referring Provider: Treating Provider/Extender: [name] in Treatment: 25 Verbal / Phone Orders: No Diagnosis Coding ICD-10 Coding Code Description E11.621 Type  2 diabetes mellitus with foot ulcer L97.522 Non-pressure chronic ulcer of other part of left foot with fat layer exposed T24.311A Burn of third degree of right thigh, initial encounter L97.122 Non-pressure chronic ulcer of left 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 L97.512 Non-pressure chronic ulcer of other part of right foot with fat layer exposed M31.31 Wegener's granulomatosis with renal involvement N18.9 Chronic kidney disease, unspecified Follow-up Appointments ppointment in 2 weeks. - with Leonard Schwartz Return A Bathing/ Shower/ Hygiene May shower and wash wound with soap and water. - shower with hibiclens soap weekly Non Wound Condition Other Non Wound Condition Orders/Instructions: - go to emergency room if right toe and foot redness worsens, fever, chills or increased pain Home Health No change in wound care orders this week; continue Home Health for wound care. May utilize formulary equivalent dressing for wound treatment orders unless otherwise specified. Other Home Health Orders/Instructions: - [facility] Wound #12 - Upper Leg  Wound Laterality: Right Prim Dressing: Santyl Ointment (Home Health) 1 x Per Day/30 Days ary Discharge Instructions: Apply nickel thick amount to wound bed as instructed Secondary Dressing: Woven Gauze Sponges 2x2 in (Home Health) 1 x Per Day/30 Days Discharge Instructions: Apply over primary dressing as directed. Secured With: Ecologist Dressing 4x4.75 (in/in) (Home Health) 1 x Per Day/30 Days Discharge Instructions: Secure dressing with Tegaderm as directed. Wound #13 - Amputation Site - Transmetatarsal Wound Laterality: Left Cleanser: Normal Saline (Home Health) 1 x Per Day/30 Days Discharge Instructions: Cleanse the wound with Normal Saline prior to applying a clean dressing using gauze sponges, not tissue or cotton balls. Prim Dressing: Promogran Prisma Matrix, 4.34 (sq in) (silver collagen) (Home Health) 1 x Per Day/30  Days ary Discharge Instructions: Moisten collagen with saline or hydrogel Secondary Dressing: Woven Gauze Sponges 2x2 in (Home Health) 1 x Per Day/30 Days Discharge Instructions: Apply over primary dressing or may use foam border Secondary Dressing: Drawtex 4x4 in (Home Health) 1 x Per Day/30 Days Discharge Instructions: Apply over primary dressing, cut to fit inside wound edges to hold collage at base of wound Secured With: Conforming Stretch Gauze Bandage, Sterile 2x75 (in/in) (Home Health) 1 x Per Day/30 Days Discharge Instructions: Secure with stretch gauze as directed. Wound #14 - T Second oe Wound Laterality: Right Topical: Gentamicin (Home Health) 1 x Per Day/15 Days Discharge Instructions: thin layer on wound bed with dressing changes Prim Dressing: KerraCel Ag Gelling Fiber Dressing, 2x2 in (silver alginate) (Home Health) 1 x Per Day/15 Days ary Discharge Instructions: Apply silver alginate to wound bed as instructed Secondary Dressing: Woven Gauze Sponges 2x2 in 1 x Per Day/15 Days Discharge Instructions: Apply over primary dressing as directed. Secured With: Insurance underwriter, Sterile 2x75 (in/in) (Home Health) 1 x Per Day/15 Days Discharge Instructions: Secure with stretch gauze as directed. Secured With: Paper Tape, 2x10 (in/yd) 1 x Per Day/15 Days Discharge Instructions: Secure dressing with tape as directed. Wound #16 - Lower Leg Wound Laterality: Right, Anterior Prim Dressing: Promogran Prisma Matrix, 4.34 (sq in) (silver collagen) ary Discharge Instructions: Moisten collagen with saline or hydrogel Secondary Dressing: Zetuvit Plus 4x4 in Discharge Instructions: Apply over primary dressing as directed. Radiology MRI, lower extremity with/without contast right foot - diabetic foot ulcer right great toe, pain, r/o osteomyelitis CPT - (ICD10 L97.512 - Non- pressure chronic ulcer of other part of right foot with fat layer exposed) Electronic  Signature(s) Signed: 03/09/2021 1:22:22 PM By: [name] Signed: 03/09/2021 6:10:17 PM By: Zenaida Deed RN, BSN Entered By: Zenaida Deed on 03/09/2021 11:43:01 Prescription 03/09/2021 -------------------------------------------------------------------------------- Skip Estimable PA Patient Name: Provider: 22-Jan-1966 [id number] Date of Birth: NPI#Sherral Hammers Sex: DEA #: [phone] Phone #: License #: Eligha Bridegroom [facility] Patient Address: [address] [place], [place] [address] [address] [place], [place] [address] [phone] Allergies ibuprofen; penicillin Provider's Orders MRI, lower extremity with/without contast right foot - ICD10: L97.512 - diabetic foot ulcer right great toe, pain, r/o osteomyelitis CPT Hand Signature: Date(s): Electronic Signature(s) Signed: 03/09/2021 1:22:22 PM By: [name] Signed: 03/09/2021 6:10:17 PM By: Zenaida Deed RN, BSN Entered By: Zenaida Deed on 03/09/2021 11:43:02 -------------------------------------------------------------------------------- Problem List Details Patient Name: Date of Service: [name], [name] 03/09/2021 10:30 A M Medical Record Number: [id number] Patient Account Number: [id number] Date of Birth/Sex: Treating RN: 1966/10/11 (55 y.o. [name] Primary Care Provider: Antony [name] Other Clinician:  Referring Provider: Treating Provider/Extender: Stacey BattyStone III, Traci Gafford Badger, Michael [name]: 25 Active Problems ICD-10 Encounter Code Description Active Date MDM Diagnosis E11.621 Type 2 diabetes mellitus with foot ulcer 09/15/2020 No Yes L97.522 Non-pressure chronic ulcer of other part of left foot with fat layer exposed 02/23/2021 No Yes T24.311A Burn of third degree of right thigh, initial encounter 01/12/2021 No Yes L97.122 Non-pressure chronic ulcer of left 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 10/08/2020 No Yes L97.512  Non-pressure chronic ulcer of other part of right foot with fat layer exposed 09/15/2020 No Yes M31.31 Wegener's granulomatosis with renal involvement 09/15/2020 No Yes N18.9 Chronic kidney disease, unspecified 09/15/2020 No Yes Inactive Problems Resolved Problems ICD-10 Code Description Active Date Resolved Date L97.812 Non-pressure chronic ulcer of other part of right lower leg with fat layer exposed 09/15/2020 09/15/2020 Z61.09697.822 Non-pressure chronic ulcer of other part of left lower leg with fat layer exposed 11/24/2020 11/24/2020 Electronic Signature(s) Signed: 03/09/2021 11:29:13 AM By: Stacey KelpStone III, Michaelia Beilfuss [name] Entered By: Stacey KelpStone III, Foxx Klarich on 03/09/2021 11:29:12 -------------------------------------------------------------------------------- Progress Note Details Patient Name: Date of Service: [name], [name] 03/09/2021 10:30 A M Medical Record Number: [id number] Patient Account Number: [id number] Date of Birth/Sex: Treating RN: 1966/10/15 (55 y.o. Stacey StandardF) Boehlein, Linda Primary Care Provider: Antony HasteBadger, Michael Other Clinician: Referring Provider: Treating Provider/Extender: Stacey BattyStone III, Sumie Remsen Badger, Michael [name]: 25 Subjective Chief Complaint Information obtained from Patient Bilateral LE Ulcers and bilateral foot ulcers History of Present Illness (HPI) 09/15/2020 upon evaluation [name] patient appears to be doing somewhat poorly in regard to the wounds on her feet. She has a wound on the right lateral foot, right medial great toe, right dorsal great toe, left lateral foot, and right lateral lower leg. Currently based on what we are seeing the patient states that the worst issue is her right great toe. She dates that she wishes Dr. Victorino DikeHewitt had actually gone ahead and performed amputation at this site as well when he did the other toes they have all healed well that is the right third toe, left great toe, and left third toe. All are completely healed and did excellent. Currently she has  been transition into Cam boots for both feet previously she was using postop shoes but apparently this caused some rubbing on the lateral aspects of both feet and at the medial aspect of her right great toe which unfortunately has caused her a lot of discomfort and issues. She has been given mupirocin and betamethasone for some of the small wounds that occur over her legs and arms frequently. 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 Otherwise she also does have diabetes mellitus type 2. Currently the patient is on methotrexate and also undergoes chemotherapy infusions every 6 months for the Wegener's granulomatosis currently her amputations were asked on August 12, 2020 that was with Dr. Victorino DikeHewitt. His physician assistant Stacey AlexandersJustin referred her to me [name]. 09/22/2020 upon evaluation [name] patient appears to be making good progress with the Santyl at this point. I am extremely pleased with where things stand [name] and I do believe him to be able to debride some of the left lateral foot wound which we have not been able to do as the eschar was too thick. I think this is doing much better currently. 09/29/2020 upon evaluation [name] patient appears to be doing decently well in regard to her ulcers on her feet. I do believe the  Santyl has been of benefit. With that being said I am concerned about the fact that she continues to have wounds over her arms and legs bilaterally which seem to continually be an issue. Fortunately there is no signs of active infection at this time but again I am not really sure where these are coming from. She is seeing dermatology they had suggested this was pyoderma but that seems kind of unusual based on how they seem to come up. She has been using a mix of steroid and mupirocin over these areas. 12/17; this is a patient called in urgently to be seen. She was  apparently in the shower washing the wound on the left lateral fifth metatarsal head and noticed a hole present. She also complained of increasing pain.No systemic signs or symptoms. 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 She says her wound started from friction with Cam boots that she had after amputation of three toes two on the left and one on the right 10/13/2020 upon evaluation [name] patient appears to be doing really about the same in regard to her feet. She is scheduled for amputation surgery on the 30th of this month December 2021. 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

## 2021-03-11 ENCOUNTER — Other Ambulatory Visit (HOSPITAL_COMMUNITY): Payer: Self-pay | Admitting: Family Medicine

## 2021-03-11 DIAGNOSIS — Z008 Encounter for other general examination: Secondary | ICD-10-CM

## 2021-03-13 ENCOUNTER — Ambulatory Visit (HOSPITAL_COMMUNITY)
Admission: RE | Admit: 2021-03-13 | Discharge: 2021-03-13 | Disposition: A | Discharge status: Home / Self Care | Payer: BC Managed Care – PPO | Source: Ambulatory Visit | Attending: Family Medicine | Admitting: Family Medicine

## 2021-03-13 ENCOUNTER — Other Ambulatory Visit: Payer: Self-pay

## 2021-03-13 DIAGNOSIS — Z008 Encounter for other general examination: Secondary | ICD-10-CM

## 2021-03-17 ENCOUNTER — Other Ambulatory Visit: Payer: Self-pay | Admitting: Physician Assistant

## 2021-03-17 ENCOUNTER — Other Ambulatory Visit (HOSPITAL_COMMUNITY): Payer: Self-pay | Admitting: Physician Assistant

## 2021-03-17 DIAGNOSIS — L97519 Non-pressure chronic ulcer of other part of right foot with unspecified severity: Secondary | ICD-10-CM

## 2021-03-17 DIAGNOSIS — M79674 Pain in right toe(s): Secondary | ICD-10-CM

## 2021-03-23 ENCOUNTER — Encounter (HOSPITAL_BASED_OUTPATIENT_CLINIC_OR_DEPARTMENT_OTHER): Payer: BC Managed Care – PPO | Admitting: Physician Assistant

## 2021-03-24 ENCOUNTER — Inpatient Hospital Stay (HOSPITAL_COMMUNITY): Payer: BC Managed Care – PPO | Admitting: Certified Registered"

## 2021-03-24 ENCOUNTER — Encounter (HOSPITAL_COMMUNITY)
Admission: AD | Disposition: A | Discharge status: Home Health | Payer: Self-pay | Source: Other Acute Inpatient Hospital | Attending: Internal Medicine

## 2021-03-24 ENCOUNTER — Inpatient Hospital Stay (HOSPITAL_COMMUNITY)
Admission: AD | Admit: 2021-03-24 | Discharge: 2021-03-30 | DRG: 617 | Disposition: A | Discharge status: Home Health | Payer: BC Managed Care – PPO | Source: Other Acute Inpatient Hospital | Attending: Internal Medicine | Admitting: Internal Medicine

## 2021-03-24 ENCOUNTER — Encounter (HOSPITAL_COMMUNITY): Payer: Self-pay | Admitting: Internal Medicine

## 2021-03-24 DIAGNOSIS — K219 Gastro-esophageal reflux disease without esophagitis: Secondary | ICD-10-CM | POA: Diagnosis present

## 2021-03-24 DIAGNOSIS — D849 Immunodeficiency, unspecified: Secondary | ICD-10-CM | POA: Diagnosis present

## 2021-03-24 DIAGNOSIS — M86172 Other acute osteomyelitis, left ankle and foot: Secondary | ICD-10-CM | POA: Diagnosis present

## 2021-03-24 DIAGNOSIS — N1831 Chronic kidney disease, stage 3a: Secondary | ICD-10-CM | POA: Diagnosis present

## 2021-03-24 DIAGNOSIS — M86671 Other chronic osteomyelitis, right ankle and foot: Secondary | ICD-10-CM | POA: Diagnosis present

## 2021-03-24 DIAGNOSIS — R197 Diarrhea, unspecified: Secondary | ICD-10-CM | POA: Diagnosis present

## 2021-03-24 DIAGNOSIS — Z8249 Family history of ischemic heart disease and other diseases of the circulatory system: Secondary | ICD-10-CM

## 2021-03-24 DIAGNOSIS — M313 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: Secondary | ICD-10-CM | POA: Diagnosis present

## 2021-03-24 DIAGNOSIS — M86672 Other chronic osteomyelitis, left ankle and foot: Secondary | ICD-10-CM | POA: Diagnosis present

## 2021-03-24 DIAGNOSIS — E11621 Type 2 diabetes mellitus with foot ulcer: Secondary | ICD-10-CM | POA: Diagnosis present

## 2021-03-24 DIAGNOSIS — I776 Arteritis, unspecified: Secondary | ICD-10-CM

## 2021-03-24 DIAGNOSIS — Z7952 Long term (current) use of systemic steroids: Secondary | ICD-10-CM

## 2021-03-24 DIAGNOSIS — Z20822 Contact with and (suspected) exposure to covid-19: Secondary | ICD-10-CM | POA: Diagnosis present

## 2021-03-24 DIAGNOSIS — Z89421 Acquired absence of other right toe(s): Secondary | ICD-10-CM

## 2021-03-24 DIAGNOSIS — E1169 Type 2 diabetes mellitus with other specified complication: Principal | ICD-10-CM | POA: Diagnosis present

## 2021-03-24 DIAGNOSIS — Z89422 Acquired absence of other left toe(s): Secondary | ICD-10-CM

## 2021-03-24 DIAGNOSIS — D72829 Elevated white blood cell count, unspecified: Secondary | ICD-10-CM | POA: Diagnosis not present

## 2021-03-24 DIAGNOSIS — I959 Hypotension, unspecified: Secondary | ICD-10-CM | POA: Diagnosis present

## 2021-03-24 DIAGNOSIS — Z886 Allergy status to analgesic agent status: Secondary | ICD-10-CM

## 2021-03-24 DIAGNOSIS — J3489 Other specified disorders of nose and nasal sinuses: Secondary | ICD-10-CM

## 2021-03-24 DIAGNOSIS — G8929 Other chronic pain: Secondary | ICD-10-CM | POA: Diagnosis present

## 2021-03-24 DIAGNOSIS — E1142 Type 2 diabetes mellitus with diabetic polyneuropathy: Secondary | ICD-10-CM | POA: Diagnosis present

## 2021-03-24 DIAGNOSIS — L97419 Non-pressure chronic ulcer of right heel and midfoot with unspecified severity: Secondary | ICD-10-CM | POA: Diagnosis present

## 2021-03-24 DIAGNOSIS — N390 Urinary tract infection, site not specified: Secondary | ICD-10-CM | POA: Diagnosis present

## 2021-03-24 DIAGNOSIS — E039 Hypothyroidism, unspecified: Secondary | ICD-10-CM | POA: Diagnosis present

## 2021-03-24 DIAGNOSIS — N183 Chronic kidney disease, stage 3 unspecified: Secondary | ICD-10-CM | POA: Diagnosis not present

## 2021-03-24 DIAGNOSIS — Z9119 Patient's noncompliance with other medical treatment and regimen: Secondary | ICD-10-CM

## 2021-03-24 DIAGNOSIS — M86171 Other acute osteomyelitis, right ankle and foot: Secondary | ICD-10-CM | POA: Diagnosis present

## 2021-03-24 DIAGNOSIS — M869 Osteomyelitis, unspecified: Secondary | ICD-10-CM | POA: Diagnosis not present

## 2021-03-24 DIAGNOSIS — I7782 Antineutrophilic cytoplasmic antibody (ANCA) vasculitis: Secondary | ICD-10-CM

## 2021-03-24 DIAGNOSIS — E876 Hypokalemia: Secondary | ICD-10-CM | POA: Diagnosis not present

## 2021-03-24 DIAGNOSIS — Z79899 Other long term (current) drug therapy: Secondary | ICD-10-CM

## 2021-03-24 DIAGNOSIS — E1122 Type 2 diabetes mellitus with diabetic chronic kidney disease: Secondary | ICD-10-CM | POA: Diagnosis present

## 2021-03-24 DIAGNOSIS — G4733 Obstructive sleep apnea (adult) (pediatric): Secondary | ICD-10-CM | POA: Diagnosis present

## 2021-03-24 DIAGNOSIS — E669 Obesity, unspecified: Secondary | ICD-10-CM | POA: Diagnosis present

## 2021-03-24 DIAGNOSIS — Z6832 Body mass index (BMI) 32.0-32.9, adult: Secondary | ICD-10-CM | POA: Diagnosis not present

## 2021-03-24 DIAGNOSIS — D649 Anemia, unspecified: Secondary | ICD-10-CM | POA: Diagnosis not present

## 2021-03-24 DIAGNOSIS — Z833 Family history of diabetes mellitus: Secondary | ICD-10-CM

## 2021-03-24 DIAGNOSIS — Z7984 Long term (current) use of oral hypoglycemic drugs: Secondary | ICD-10-CM

## 2021-03-24 DIAGNOSIS — I7789 Other specified disorders of arteries and arterioles: Secondary | ICD-10-CM | POA: Diagnosis present

## 2021-03-24 DIAGNOSIS — D631 Anemia in chronic kidney disease: Secondary | ICD-10-CM | POA: Diagnosis present

## 2021-03-24 DIAGNOSIS — Z880 Allergy status to penicillin: Secondary | ICD-10-CM

## 2021-03-24 DIAGNOSIS — M868X7 Other osteomyelitis, ankle and foot: Secondary | ICD-10-CM | POA: Diagnosis present

## 2021-03-24 DIAGNOSIS — D500 Iron deficiency anemia secondary to blood loss (chronic): Secondary | ICD-10-CM | POA: Diagnosis not present

## 2021-03-24 HISTORY — PX: TRANSMETATARSAL AMPUTATION: SHX6197

## 2021-03-24 HISTORY — PX: AMPUTATION TOE: SHX6595

## 2021-03-24 LAB — COMPREHENSIVE METABOLIC PANEL
ALT: 16 U/L (ref 0–44)
AST: 22 U/L (ref 15–41)
Albumin: 2.8 g/dL — ABNORMAL LOW (ref 3.5–5.0)
Alkaline Phosphatase: 60 U/L (ref 38–126)
Anion gap: 4 — ABNORMAL LOW (ref 5–15)
BUN: 7 mg/dL (ref 6–20)
CO2: 21 mmol/L — ABNORMAL LOW (ref 22–32)
Calcium: 7.9 mg/dL — ABNORMAL LOW (ref 8.9–10.3)
Chloride: 115 mmol/L — ABNORMAL HIGH (ref 98–111)
Creatinine, Ser: 0.97 mg/dL (ref 0.44–1.00)
GFR, Estimated: >60 mL/min (ref >60)
Glucose, Bld: 162 mg/dL — ABNORMAL HIGH (ref 70–99)
Potassium: 3.1 mmol/L — ABNORMAL LOW (ref 3.5–5.1)
Sodium: 140 mmol/L (ref 135–145)
Total Bilirubin: 0.7 mg/dL (ref 0.3–1.2)
Total Protein: 4.9 g/dL — ABNORMAL LOW (ref 6.5–8.1)

## 2021-03-24 LAB — C-REACTIVE PROTEIN: CRP: 1.1 mg/dL — ABNORMAL HIGH (ref <1.0)

## 2021-03-24 LAB — SURGICAL PCR SCREEN
MRSA, PCR: NEGATIVE
Staphylococcus aureus: NEGATIVE

## 2021-03-24 LAB — CBC WITH DIFFERENTIAL/PLATELET
Abs Immature Granulocytes: 0.09 10*3/uL — ABNORMAL HIGH (ref 0.00–0.07)
Basophils Absolute: 0.1 10*3/uL (ref 0.0–0.1)
Basophils Relative: 1 %
Eosinophils Absolute: 0.3 10*3/uL (ref 0.0–0.5)
Eosinophils Relative: 2 %
HCT: 30.1 % — ABNORMAL LOW (ref 36.0–46.0)
Hemoglobin: 9.1 g/dL — ABNORMAL LOW (ref 12.0–15.0)
Immature Granulocytes: 1 %
Lymphocytes Relative: 17 %
Lymphs Abs: 2.1 10*3/uL (ref 0.7–4.0)
MCH: 26.7 pg (ref 26.0–34.0)
MCHC: 30.2 g/dL (ref 30.0–36.0)
MCV: 88.3 fL (ref 80.0–100.0)
Monocytes Absolute: 1 10*3/uL (ref 0.1–1.0)
Monocytes Relative: 8 %
Neutro Abs: 8.7 10*3/uL — ABNORMAL HIGH (ref 1.7–7.7)
Neutrophils Relative %: 71 %
Platelets: 337 10*3/uL (ref 150–400)
RBC: 3.41 MIL/uL — ABNORMAL LOW (ref 3.87–5.11)
RDW: 17.3 % — ABNORMAL HIGH (ref 11.5–15.5)
WBC: 12.1 10*3/uL — ABNORMAL HIGH (ref 4.0–10.5)
nRBC: 0 % (ref 0.0–0.2)

## 2021-03-24 LAB — GLUCOSE, CAPILLARY
Glucose-Capillary: 110 mg/dL — ABNORMAL HIGH (ref 70–99)
Glucose-Capillary: 134 mg/dL — ABNORMAL HIGH (ref 70–99)
Glucose-Capillary: 127 mg/dL — ABNORMAL HIGH (ref 70–99)
Glucose-Capillary: 145 mg/dL — ABNORMAL HIGH (ref 70–99)

## 2021-03-24 LAB — PREALBUMIN: Prealbumin: 20.4 mg/dL (ref 18–38)

## 2021-03-24 LAB — SEDIMENTATION RATE: Sed Rate: 28 mm/hr — ABNORMAL HIGH (ref 0–22)

## 2021-03-24 LAB — SARS CORONAVIRUS 2 (TAT 6-24 HRS): SARS Coronavirus 2: NEGATIVE

## 2021-03-24 SURGERY — AMPUTATION, FOOT, TRANSMETATARSAL
Anesthesia: General | Site: Toe | Laterality: Right

## 2021-03-24 MED ORDER — METRONIDAZOLE 500 MG/100ML IV SOLN
500.0000 mg | Freq: Three times a day (TID) | INTRAVENOUS | Status: DC
  Administered 2021-03-24 – 2021-03-28 (×11): 500 mg via INTRAVENOUS
  Filled 2021-03-24 (×12): qty 100

## 2021-03-24 MED ORDER — PHENYLEPHRINE 40 MCG/ML (10ML) SYRINGE FOR IV PUSH (FOR BLOOD PRESSURE SUPPORT)
PREFILLED_SYRINGE | INTRAVENOUS | Status: AC
  Filled 2021-03-24: qty 10

## 2021-03-24 MED ORDER — INSULIN ASPART 100 UNIT/ML IJ SOLN: 0.0000 [IU] | Freq: Every day | INTRAMUSCULAR | Status: DC

## 2021-03-24 MED ORDER — FUROSEMIDE 40 MG PO TABS
40.0000 mg | ORAL_TABLET | Freq: Every day | ORAL | Status: DC
  Administered 2021-03-25 – 2021-03-30 (×6): 40 mg via ORAL
  Filled 2021-03-24 (×7): qty 1

## 2021-03-24 MED ORDER — SODIUM CHLORIDE 0.9 % IV SOLN
INTRAVENOUS | Status: DC | PRN
  Administered 2021-03-24: 250 mL via INTRAVENOUS
  Administered 2021-03-25: 500 mL via INTRAVENOUS

## 2021-03-24 MED ORDER — ONDANSETRON HCL 4 MG/2ML IJ SOLN
INTRAMUSCULAR | Status: AC
  Filled 2021-03-24: qty 2

## 2021-03-24 MED ORDER — PREDNISONE 5 MG PO TABS
7.5000 mg | ORAL_TABLET | Freq: Every day | ORAL | Status: DC
  Administered 2021-03-25 – 2021-03-30 (×6): 7.5 mg via ORAL
  Filled 2021-03-24 (×6): qty 2

## 2021-03-24 MED ORDER — METOCLOPRAMIDE HCL 5 MG/ML IJ SOLN
5.0000 mg | Freq: Three times a day (TID) | INTRAMUSCULAR | Status: DC | PRN
Start: 2021-03-24 — End: 2021-03-30

## 2021-03-24 MED ORDER — MAGNESIUM OXIDE -MG SUPPLEMENT 400 (240 MG) MG PO TABS
400.0000 mg | ORAL_TABLET | Freq: Two times a day (BID) | ORAL | Status: DC
  Administered 2021-03-24 – 2021-03-30 (×12): 400 mg via ORAL
  Filled 2021-03-24 (×12): qty 1

## 2021-03-24 MED ORDER — ROCURONIUM BROMIDE 10 MG/ML (PF) SYRINGE
PREFILLED_SYRINGE | INTRAVENOUS | Status: AC
  Filled 2021-03-24: qty 10

## 2021-03-24 MED ORDER — PROPOFOL 10 MG/ML IV BOLUS
INTRAVENOUS | Status: DC | PRN
  Administered 2021-03-24: 140 mg via INTRAVENOUS

## 2021-03-24 MED ORDER — ONDANSETRON HCL 4 MG PO TABS: 4.0000 mg | ORAL_TABLET | Freq: Four times a day (QID) | ORAL | Status: DC | PRN

## 2021-03-24 MED ORDER — CALCIUM GLUCONATE-NACL 2-0.675 GM/100ML-% IV SOLN
2.0000 g | Freq: Once | INTRAVENOUS | Status: AC
  Administered 2021-03-25: 2000 mg via INTRAVENOUS
  Filled 2021-03-24 (×2): qty 100

## 2021-03-24 MED ORDER — LIDOCAINE 2% (20 MG/ML) 5 ML SYRINGE
INTRAMUSCULAR | Status: AC
  Filled 2021-03-24: qty 5

## 2021-03-24 MED ORDER — SUCCINYLCHOLINE CHLORIDE 200 MG/10ML IV SOSY
PREFILLED_SYRINGE | INTRAVENOUS | Status: DC | PRN
  Administered 2021-03-24: 120 mg via INTRAVENOUS

## 2021-03-24 MED ORDER — ACETAMINOPHEN 325 MG PO TABS
650.0000 mg | ORAL_TABLET | Freq: Four times a day (QID) | ORAL | Status: DC | PRN
  Administered 2021-03-25 – 2021-03-27 (×4): 650 mg via ORAL
  Filled 2021-03-24 (×4): qty 2

## 2021-03-24 MED ORDER — POTASSIUM CHLORIDE CRYS ER 20 MEQ PO TBCR
40.0000 meq | EXTENDED_RELEASE_TABLET | ORAL | Status: DC
  Administered 2021-03-25 – 2021-03-29 (×3): 40 meq via ORAL
  Filled 2021-03-24 (×5): qty 2

## 2021-03-24 MED ORDER — METOCLOPRAMIDE HCL 10 MG PO TABS: 5.0000 mg | ORAL_TABLET | Freq: Three times a day (TID) | ORAL | Status: DC | PRN

## 2021-03-24 MED ORDER — MONTELUKAST SODIUM 10 MG PO TABS
10.0000 mg | ORAL_TABLET | Freq: Every day | ORAL | Status: DC
  Administered 2021-03-24 – 2021-03-29 (×5): 10 mg via ORAL
  Filled 2021-03-24 (×6): qty 1

## 2021-03-24 MED ORDER — GABAPENTIN 300 MG PO CAPS: 600.0000 mg | ORAL_CAPSULE | ORAL | Status: DC

## 2021-03-24 MED ORDER — TIZANIDINE HCL 2 MG PO TABS
2.0000 mg | ORAL_TABLET | Freq: Two times a day (BID) | ORAL | Status: DC | PRN
  Filled 2021-03-24: qty 1

## 2021-03-24 MED ORDER — TRAZODONE HCL 100 MG PO TABS
100.0000 mg | ORAL_TABLET | Freq: Every day | ORAL | Status: DC
  Administered 2021-03-24 – 2021-03-29 (×6): 100 mg via ORAL
  Filled 2021-03-24 (×6): qty 1

## 2021-03-24 MED ORDER — OXYCODONE HCL 5 MG PO TABS
5.0000 mg | ORAL_TABLET | ORAL | Status: DC | PRN
  Administered 2021-03-28: 5 mg via ORAL
  Filled 2021-03-24: qty 2

## 2021-03-24 MED ORDER — TOPIRAMATE 100 MG PO TABS
100.0000 mg | ORAL_TABLET | Freq: Every day | ORAL | Status: DC
  Administered 2021-03-24 – 2021-03-30 (×7): 100 mg via ORAL
  Filled 2021-03-24 (×7): qty 1

## 2021-03-24 MED ORDER — ONDANSETRON HCL 4 MG/2ML IJ SOLN: 4.0000 mg | Freq: Four times a day (QID) | INTRAMUSCULAR | Status: DC | PRN

## 2021-03-24 MED ORDER — ROCURONIUM BROMIDE 10 MG/ML (PF) SYRINGE
PREFILLED_SYRINGE | INTRAVENOUS | Status: DC | PRN
  Administered 2021-03-24: 50 mg via INTRAVENOUS

## 2021-03-24 MED ORDER — SODIUM CHLORIDE 0.9 % IV SOLN
2.0000 g | INTRAVENOUS | Status: DC
  Administered 2021-03-24 – 2021-03-28 (×5): 2 g via INTRAVENOUS
  Filled 2021-03-24: qty 2
  Filled 2021-03-24: qty 20
  Filled 2021-03-24: qty 2
  Filled 2021-03-24: qty 20
  Filled 2021-03-24: qty 2

## 2021-03-24 MED ORDER — HYDROMORPHONE HCL 1 MG/ML IJ SOLN
0.5000 mg | INTRAMUSCULAR | Status: DC | PRN
  Administered 2021-03-24 – 2021-03-28 (×5): 1 mg via INTRAVENOUS
  Filled 2021-03-24 (×5): qty 1

## 2021-03-24 MED ORDER — FENTANYL CITRATE (PF) 100 MCG/2ML IJ SOLN
INTRAMUSCULAR | Status: AC
  Filled 2021-03-24: qty 2

## 2021-03-24 MED ORDER — FENTANYL CITRATE (PF) 250 MCG/5ML IJ SOLN
INTRAMUSCULAR | Status: AC
  Filled 2021-03-24: qty 5

## 2021-03-24 MED ORDER — DULOXETINE HCL 60 MG PO CPEP
60.0000 mg | ORAL_CAPSULE | Freq: Two times a day (BID) | ORAL | Status: DC
  Administered 2021-03-24 – 2021-03-30 (×12): 60 mg via ORAL
  Filled 2021-03-24 (×12): qty 1

## 2021-03-24 MED ORDER — LACTATED RINGERS IV SOLN: INTRAVENOUS | Status: DC | PRN

## 2021-03-24 MED ORDER — SUGAMMADEX SODIUM 200 MG/2ML IV SOLN
INTRAVENOUS | Status: DC | PRN
  Administered 2021-03-24: 200 mg via INTRAVENOUS

## 2021-03-24 MED ORDER — BACITRACIN ZINC 500 UNIT/GM EX OINT
TOPICAL_OINTMENT | CUTANEOUS | Status: AC
  Filled 2021-03-24: qty 28.35

## 2021-03-24 MED ORDER — ENOXAPARIN SODIUM 40 MG/0.4ML IJ SOSY: 40.0000 mg | PREFILLED_SYRINGE | INTRAMUSCULAR | Status: DC

## 2021-03-24 MED ORDER — VANCOMYCIN HCL IN DEXTROSE 1-5 GM/200ML-% IV SOLN: 1000.0000 mg | INTRAVENOUS | Status: AC

## 2021-03-24 MED ORDER — CHLORHEXIDINE GLUCONATE 4 % EX LIQD: 60.0000 mL | Freq: Once | CUTANEOUS | Status: DC

## 2021-03-24 MED ORDER — CEFAZOLIN SODIUM-DEXTROSE 2-3 GM-%(50ML) IV SOLR
INTRAVENOUS | Status: DC | PRN
  Administered 2021-03-24: 2 g via INTRAVENOUS

## 2021-03-24 MED ORDER — POTASSIUM CHLORIDE CRYS ER 20 MEQ PO TBCR: 20.0000 meq | EXTENDED_RELEASE_TABLET | Freq: Every day | ORAL | Status: DC | PRN

## 2021-03-24 MED ORDER — VANCOMYCIN HCL IN DEXTROSE 1-5 GM/200ML-% IV SOLN
INTRAVENOUS | Status: AC
  Administered 2021-03-24: 1000 mg via INTRAVENOUS
  Filled 2021-03-24: qty 200

## 2021-03-24 MED ORDER — VANCOMYCIN HCL 500 MG IV SOLR
INTRAVENOUS | Status: DC | PRN
  Administered 2021-03-24 (×2): 500 mg via TOPICAL

## 2021-03-24 MED ORDER — POTASSIUM CHLORIDE CRYS ER 20 MEQ PO TBCR
40.0000 meq | EXTENDED_RELEASE_TABLET | ORAL | Status: AC
  Administered 2021-03-24: 40 meq via ORAL
  Filled 2021-03-24: qty 2

## 2021-03-24 MED ORDER — OXYCODONE HCL 5 MG PO TABS
10.0000 mg | ORAL_TABLET | ORAL | Status: DC | PRN
  Administered 2021-03-24 – 2021-03-25 (×4): 15 mg via ORAL
  Administered 2021-03-26: 10 mg via ORAL
  Administered 2021-03-26 (×2): 15 mg via ORAL
  Administered 2021-03-26: 10 mg via ORAL
  Administered 2021-03-27 – 2021-03-28 (×6): 15 mg via ORAL
  Administered 2021-03-28: 10 mg via ORAL
  Administered 2021-03-29 (×4): 15 mg via ORAL
  Administered 2021-03-30 (×2): 10 mg via ORAL
  Administered 2021-03-30: 15 mg via ORAL
  Administered 2021-03-30: 10 mg via ORAL
  Filled 2021-03-24: qty 2
  Filled 2021-03-24 (×5): qty 3
  Filled 2021-03-24: qty 2
  Filled 2021-03-24 (×8): qty 3
  Filled 2021-03-24: qty 2
  Filled 2021-03-24 (×4): qty 3
  Filled 2021-03-24 (×3): qty 2
  Filled 2021-03-24: qty 3

## 2021-03-24 MED ORDER — CHLORHEXIDINE GLUCONATE 0.12 % MT SOLN
OROMUCOSAL | Status: AC
  Administered 2021-03-24: 15 mL
  Filled 2021-03-24: qty 15

## 2021-03-24 MED ORDER — SODIUM CHLORIDE 0.9 % IR SOLN
Status: DC | PRN
  Administered 2021-03-24: 1000 mL

## 2021-03-24 MED ORDER — ACETAMINOPHEN 650 MG RE SUPP: 650.0000 mg | Freq: Four times a day (QID) | RECTAL | Status: DC | PRN

## 2021-03-24 MED ORDER — PROPOFOL 10 MG/ML IV BOLUS
INTRAVENOUS | Status: AC
  Filled 2021-03-24: qty 20

## 2021-03-24 MED ORDER — EPHEDRINE 5 MG/ML INJ
INTRAVENOUS | Status: AC
  Filled 2021-03-24: qty 10

## 2021-03-24 MED ORDER — GABAPENTIN 400 MG PO CAPS
1200.0000 mg | ORAL_CAPSULE | Freq: Every evening | ORAL | Status: DC
  Administered 2021-03-24 – 2021-03-29 (×6): 1200 mg via ORAL
  Filled 2021-03-24 (×6): qty 3

## 2021-03-24 MED ORDER — FENTANYL CITRATE (PF) 250 MCG/5ML IJ SOLN
INTRAMUSCULAR | Status: DC | PRN
  Administered 2021-03-24: 25 ug via INTRAVENOUS
  Administered 2021-03-24 (×2): 50 ug via INTRAVENOUS
  Administered 2021-03-24: 25 ug via INTRAVENOUS

## 2021-03-24 MED ORDER — FENTANYL CITRATE (PF) 100 MCG/2ML IJ SOLN
25.0000 ug | INTRAMUSCULAR | Status: DC | PRN
  Administered 2021-03-24: 50 ug via INTRAVENOUS

## 2021-03-24 MED ORDER — KCL IN DEXTROSE-NACL 20-5-0.45 MEQ/L-%-% IV SOLN
INTRAVENOUS | Status: DC
  Filled 2021-03-24: qty 1000

## 2021-03-24 MED ORDER — FOLIC ACID 1 MG PO TABS
2.0000 mg | ORAL_TABLET | Freq: Every day | ORAL | Status: DC
  Administered 2021-03-25 – 2021-03-30 (×6): 2 mg via ORAL
  Filled 2021-03-24 (×6): qty 2

## 2021-03-24 MED ORDER — POVIDONE-IODINE 10 % EX SWAB
2.0000 "application " | Freq: Once | CUTANEOUS | Status: AC
  Administered 2021-03-24: 2 via TOPICAL

## 2021-03-24 MED ORDER — VANCOMYCIN HCL 1750 MG/350ML IV SOLN
1750.0000 mg | Freq: Once | INTRAVENOUS | Status: DC
  Filled 2021-03-24: qty 350

## 2021-03-24 MED ORDER — TAPENTADOL HCL 50 MG PO TABS
75.0000 mg | ORAL_TABLET | Freq: Four times a day (QID) | ORAL | Status: DC
Start: 2021-03-24 — End: 2021-03-30
  Administered 2021-03-24 – 2021-03-30 (×22): 75 mg via ORAL
  Filled 2021-03-24 (×23): qty 2

## 2021-03-24 MED ORDER — VANCOMYCIN HCL 1000 MG IV SOLR
INTRAVENOUS | Status: AC
  Filled 2021-03-24: qty 1000

## 2021-03-24 MED ORDER — LIDOCAINE 5 % EX PTCH: 1.0000 | MEDICATED_PATCH | Freq: Every day | CUTANEOUS | Status: DC | PRN

## 2021-03-24 MED ORDER — VANCOMYCIN HCL 1250 MG/250ML IV SOLN: 1250.0000 mg | INTRAVENOUS | Status: DC

## 2021-03-24 MED ORDER — AMISULPRIDE (ANTIEMETIC) 5 MG/2ML IV SOLN: 10.0000 mg | Freq: Once | INTRAVENOUS | Status: DC | PRN

## 2021-03-24 MED ORDER — PHENYLEPHRINE 40 MCG/ML (10ML) SYRINGE FOR IV PUSH (FOR BLOOD PRESSURE SUPPORT)
PREFILLED_SYRINGE | INTRAVENOUS | Status: DC | PRN
  Administered 2021-03-24: 40 ug via INTRAVENOUS

## 2021-03-24 MED ORDER — SUCCINYLCHOLINE CHLORIDE 200 MG/10ML IV SOSY
PREFILLED_SYRINGE | INTRAVENOUS | Status: AC
  Filled 2021-03-24: qty 10

## 2021-03-24 MED ORDER — INSULIN ASPART 100 UNIT/ML IJ SOLN: 0.0000 [IU] | Freq: Three times a day (TID) | INTRAMUSCULAR | Status: DC

## 2021-03-24 MED ORDER — MAGNESIUM SULFATE 2 GM/50ML IV SOLN: 2.0000 g | Freq: Every day | INTRAVENOUS | Status: DC | PRN

## 2021-03-24 MED ORDER — DOCUSATE SODIUM 100 MG PO CAPS
100.0000 mg | ORAL_CAPSULE | Freq: Two times a day (BID) | ORAL | Status: DC
  Administered 2021-03-24 – 2021-03-30 (×10): 100 mg via ORAL
  Filled 2021-03-24 (×11): qty 1

## 2021-03-24 MED ORDER — MUPIROCIN 2 % EX OINT
TOPICAL_OINTMENT | Freq: Every day | CUTANEOUS | Status: DC
  Administered 2021-03-27: 1 via TOPICAL
  Filled 2021-03-24: qty 22

## 2021-03-24 MED ORDER — FAMOTIDINE 20 MG PO TABS
20.0000 mg | ORAL_TABLET | Freq: Every day | ORAL | Status: DC
  Administered 2021-03-25 – 2021-03-30 (×6): 20 mg via ORAL
  Filled 2021-03-24 (×6): qty 1

## 2021-03-24 MED ORDER — GABAPENTIN 300 MG PO CAPS
600.0000 mg | ORAL_CAPSULE | Freq: Every day | ORAL | Status: DC
  Administered 2021-03-25 – 2021-03-30 (×6): 600 mg via ORAL
  Filled 2021-03-24 (×6): qty 2

## 2021-03-24 MED ORDER — GABAPENTIN 300 MG PO CAPS
900.0000 mg | ORAL_CAPSULE | Freq: Every day | ORAL | Status: DC
  Administered 2021-03-25 – 2021-03-30 (×6): 900 mg via ORAL
  Filled 2021-03-24 (×6): qty 3

## 2021-03-24 MED ORDER — LIDOCAINE 2% (20 MG/ML) 5 ML SYRINGE
INTRAMUSCULAR | Status: DC | PRN
  Administered 2021-03-24: 100 mg via INTRAVENOUS

## 2021-03-24 SURGICAL SUPPLY — 68 items
APL PRP STRL LF DISP 70% ISPRP (MISCELLANEOUS) ×2
BANDAGE ESMARK 6X9 LF (GAUZE/BANDAGES/DRESSINGS) IMPLANT
BLADE SAW RECIP 87.9 MT (BLADE) ×4 IMPLANT
BNDG CMPR 9X4 STRL LF SNTH (GAUZE/BANDAGES/DRESSINGS) ×2
BNDG CMPR 9X6 STRL LF SNTH (GAUZE/BANDAGES/DRESSINGS)
BNDG COHESIVE 4X5 TAN STRL (GAUZE/BANDAGES/DRESSINGS) ×8 IMPLANT
BNDG COHESIVE 6X5 TAN STRL LF (GAUZE/BANDAGES/DRESSINGS) ×4 IMPLANT
BNDG ELASTIC 6X5.8 VLCR STR LF (GAUZE/BANDAGES/DRESSINGS) IMPLANT
BNDG ESMARK 4X9 LF (GAUZE/BANDAGES/DRESSINGS) ×4 IMPLANT
BNDG ESMARK 6X9 LF (GAUZE/BANDAGES/DRESSINGS)
BNDG GAUZE ELAST 4 BULKY (GAUZE/BANDAGES/DRESSINGS) ×16 IMPLANT
BOOT STEPPER DURA MED (SOFTGOODS) ×4 IMPLANT
CANISTER SUCT 3000ML PPV (MISCELLANEOUS) ×4 IMPLANT
CHLORAPREP W/TINT 26 (MISCELLANEOUS) ×4 IMPLANT
COVER SURGICAL LIGHT HANDLE (MISCELLANEOUS) ×4 IMPLANT
COVER WAND RF STERILE (DRAPES) ×4 IMPLANT
CUFF TOURN SGL QUICK 34 (TOURNIQUET CUFF) ×4
CUFF TOURN SGL QUICK 42 (TOURNIQUET CUFF) IMPLANT
CUFF TRNQT CYL 34X4.125X (TOURNIQUET CUFF) ×2 IMPLANT
DRAPE INCISE IOBAN 66X45 STRL (DRAPES) ×4 IMPLANT
DRAPE U-SHAPE 47X51 STRL (DRAPES) ×8 IMPLANT
DRSG ADAPTIC 3X8 NADH LF (GAUZE/BANDAGES/DRESSINGS) ×4 IMPLANT
DRSG MEPITEL 4X7.2 (GAUZE/BANDAGES/DRESSINGS) ×8 IMPLANT
DRSG PAD ABDOMINAL 8X10 ST (GAUZE/BANDAGES/DRESSINGS) ×8 IMPLANT
ELECT CAUTERY BLADE 6.4 (BLADE) IMPLANT
ELECT REM PT RETURN 9FT ADLT (ELECTROSURGICAL) ×4
ELECTRODE REM PT RTRN 9FT ADLT (ELECTROSURGICAL) ×2 IMPLANT
EVACUATOR 1/8 PVC DRAIN (DRAIN) IMPLANT
GAUZE SPONGE 4X4 12PLY STRL (GAUZE/BANDAGES/DRESSINGS) ×4 IMPLANT
GAUZE SPONGE 4X4 12PLY STRL LF (GAUZE/BANDAGES/DRESSINGS) ×8 IMPLANT
GLOVE BIO SURGEON STRL SZ8 (GLOVE) ×8 IMPLANT
GLOVE ECLIPSE 8.0 STRL XLNG CF (GLOVE) ×8 IMPLANT
GLOVE SRG 8 PF TXTR STRL LF DI (GLOVE) ×4 IMPLANT
GLOVE SURG UNDER POLY LF SZ8 (GLOVE) ×8
GOWN STRL REUS W/ TWL LRG LVL3 (GOWN DISPOSABLE) ×2 IMPLANT
GOWN STRL REUS W/ TWL XL LVL3 (GOWN DISPOSABLE) ×4 IMPLANT
GOWN STRL REUS W/TWL LRG LVL3 (GOWN DISPOSABLE) ×4
GOWN STRL REUS W/TWL XL LVL3 (GOWN DISPOSABLE) ×8
KIT BASIN OR (CUSTOM PROCEDURE TRAY) ×4 IMPLANT
KIT TURNOVER KIT B (KITS) ×4 IMPLANT
MANIFOLD NEPTUNE II (INSTRUMENTS) ×4 IMPLANT
NS IRRIG 1000ML POUR BTL (IV SOLUTION) ×4 IMPLANT
PACK ORTHO EXTREMITY (CUSTOM PROCEDURE TRAY) ×4 IMPLANT
PAD ARMBOARD 7.5X6 YLW CONV (MISCELLANEOUS) ×8 IMPLANT
PAD CAST 4YDX4 CTTN HI CHSV (CAST SUPPLIES) ×2 IMPLANT
PADDING CAST COTTON 4X4 STRL (CAST SUPPLIES) ×4
PADDING CAST COTTON 6X4 STRL (CAST SUPPLIES) ×4 IMPLANT
SPONGE LAP 18X18 RF (DISPOSABLE) ×4 IMPLANT
STAPLER VISISTAT 35W (STAPLE) IMPLANT
STOCKINETTE IMPERVIOUS LG (DRAPES) IMPLANT
SUCTION FRAZIER HANDLE 10FR (MISCELLANEOUS) ×4
SUCTION TUBE FRAZIER 10FR DISP (MISCELLANEOUS) ×2 IMPLANT
SUT ETHILON 2 0 FS 18 (SUTURE) IMPLANT
SUT ETHILON 2 0 PSLX (SUTURE) ×8 IMPLANT
SUT ETHILON 3 0 PS 1 (SUTURE) ×4 IMPLANT
SUT MNCRL AB 3-0 PS2 18 (SUTURE) ×4 IMPLANT
SUT PDS AB 1 CT  36 (SUTURE) ×8
SUT PDS AB 1 CT 36 (SUTURE) ×4 IMPLANT
SUT SILK 2 0 (SUTURE) ×4
SUT SILK 2-0 18XBRD TIE 12 (SUTURE) ×2 IMPLANT
SWAB COLLECTION DEVICE MRSA (MISCELLANEOUS) IMPLANT
SWAB CULTURE ESWAB REG 1ML (MISCELLANEOUS) IMPLANT
TOWEL GREEN STERILE (TOWEL DISPOSABLE) ×4 IMPLANT
TOWEL GREEN STERILE FF (TOWEL DISPOSABLE) ×4 IMPLANT
TUBE CONNECTING 12'X1/4 (SUCTIONS) ×1
TUBE CONNECTING 12X1/4 (SUCTIONS) ×3 IMPLANT
UNDERPAD 30X36 HEAVY ABSORB (UNDERPADS AND DIAPERS) ×4 IMPLANT
WATER STERILE IRR 1000ML POUR (IV SOLUTION) ×4 IMPLANT

## 2021-03-24 NOTE — Progress Notes (Signed)
RT note. Patient placed on auto cpap 20/6 w/ stable VS. RT will continue to monitor

## 2021-03-24 NOTE — Consult Note (Addendum)
Reason for Consult:Bilateral foot pain Referring Physician: Madelyn Flavors Time called: 1124 Time at bedside: 1229   [name] is an 55 y.o. female.  HPI: [name] was admitted to a [facility] hospital with hypotension and AMS a few days ago. As part of her workup she underwent MRI of both feet 2/2 ulcerations and was found to have osteo in the 2nd toe of the right foot and in the 5th MT remnant on the left. She was transferred to [facility] for definitive treatment. She has had ongoing problems with foot ulcerations with multiple amputations. The right foot problem occurred when she stubbed her toe on a scale and just wouldn't heal. She is tired of dealing with this and wonders if a prophylactic TMT amputation on the right would be reasonable.  Past Medical History:  Diagnosis Date  . Chronic cough   . Chronic pain   . Diabetes mellitus without complication (HCC)   . Dyspnea   . GERD (gastroesophageal reflux disease)   . Hypokalemia   . Hypothyroidism   . Neurocardiogenic syncope   . OSA (obstructive sleep apnea)    does not use CPAP  . Osteomyelitis (HCC)    bilateral feet  . Peripheral neuropathy   . Presence of intrathecal pump    recieves Prialt/bupivicaine  . Wegener's granulomatosis 2009  . Wegner's disease (congenital syphilitic osteochondritis)     Past Surgical History:  Procedure Laterality Date  . AMPUTATION Right 10/21/2020   Procedure: Right hallux amputation;  Surgeon: Toni Arthurs, MD;  Location: [facility];  Service: Orthopedics;  Laterality: Right;  . AMPUTATION TOE Bilateral 08/12/2020   Procedure: Right 3rd toe amputation; left hallux and 3rd toe amputation;  Surgeon: Toni Arthurs, MD;  Location: [facility];  Service: Orthopedics;  Laterality: Bilateral;   . APPENDECTOMY    . BACK SURGERY    . CHOLECYSTECTOMY    . COLONOSCOPY N/A 06/02/2013   Procedure: COLONOSCOPY;  Surgeon: Graylin Shiver, MD;  Location: [facility];  Service:  Endoscopy;  Laterality: N/A;  . HERNIA REPAIR  2000  . INCISION AND DRAINAGE ABSCESS Left 07/07/2016   Procedure: DEBRIDMENT LEFT THIGH ABSCESS, EXISION ACUTE SKIN RASH LEFT THIGH(1CM LESION);  Surgeon: Claud Kelp, MD;  Location: [facility];  Service: General;  Laterality: Left;  . SEPTOPLASTY  02/2013  . spleen anuyism    . splenic aneurysm    . TRANSMETATARSAL AMPUTATION Bilateral 10/21/2020   Procedure: Left transmetatarsal amputation;  Surgeon: Toni Arthurs, MD;  Location: [facility];  Service: Orthopedics;  Laterality: Bilateral;  . VAGINAL HYSTERECTOMY      Family History  Problem Relation Age of Onset  . Diabetes Mother   . Heart disease Mother   . Diabetes Father     Social History:  reports that she has never smoked. She has never used smokeless tobacco. She reports current alcohol use. She reports that she does not use drugs.  Allergies:  Allergies  Allergen Reactions  . Ibuprofen Other (See Comments)    Pt is unable to take this due to kidney problems.   Other reaction(s): Other Not to take with other medications   . Penicillins Rash and Other (See Comments)    Has patient had a PCN reaction causing immediate rash, facial/tongue/throat swelling, SOB or lightheadedness with hypotension: No Has patient had a PCN reaction causing severe rash involving mucus membranes or skin necrosis: No Has patient had a PCN reaction that required hospitalization No Has patient had  a PCN reaction occurring within the last 10 years: No If all of the above answers are "NO", then may proceed with Cephalosporin use.    Medications: I have reviewed the patient's current medications.  Results for orders placed or performed during the hospital encounter of 03/24/21 (from the past 48 hour(s))  CBC WITH DIFFERENTIAL     Status: Abnormal   Collection Time: 03/24/21 12:07 PM  Result Value Ref Range   WBC 12.1 (H) 4.0 - 10.5 K/uL   RBC 3.41 (L) 3.87 - 5.11 MIL/uL   Hemoglobin  9.1 (L) 12.0 - 15.0 g/dL   HCT 50.5 (L) 39.7 - 67.3 %   MCV 88.3 80.0 - 100.0 fL   MCH 26.7 26.0 - 34.0 pg   MCHC 30.2 30.0 - 36.0 g/dL   RDW 41.9 (H) 37.9 - 02.4 %   Platelets 337 150 - 400 K/uL   nRBC 0.0 0.0 - 0.2 %   Neutrophils Relative % 71 %   Neutro Abs 8.7 (H) 1.7 - 7.7 K/uL   Lymphocytes Relative 17 %   Lymphs Abs 2.1 0.7 - 4.0 K/uL   Monocytes Relative 8 %   Monocytes Absolute 1.0 0.1 - 1.0 K/uL   Eosinophils Relative 2 %   Eosinophils Absolute 0.3 0.0 - 0.5 K/uL   Basophils Relative 1 %   Basophils Absolute 0.1 0.0 - 0.1 K/uL   Immature Granulocytes 1 %   Abs Immature Granulocytes 0.09 (H) 0.00 - 0.07 K/uL    Comment: Performed at [facility], [address] [place], [place] [address]    No results found.  Review of Systems  Constitutional: Negative for chills, diaphoresis and fever.  HENT: Negative for ear discharge, ear pain, hearing loss and tinnitus.   Eyes: Negative for photophobia and pain.  Respiratory: Negative for cough and shortness of breath.   Cardiovascular: Negative for chest pain.  Gastrointestinal: Negative for abdominal pain, nausea and vomiting.  Genitourinary: Negative for dysuria, flank pain, frequency and urgency.  Musculoskeletal: Positive for arthralgias (Bilateral feet). Negative for back pain, myalgias and neck pain.  Neurological: Negative for dizziness and headaches.  Hematological: Does not bruise/bleed easily.  Psychiatric/Behavioral: The patient is not nervous/anxious.    Blood pressure 105/70, pulse 64, temperature 98.6 F (37 C), temperature source Oral, resp. rate 16, height 5\' 6"  (1.676 m), weight 91.6 kg, SpO2 97 %. Physical Exam Constitutional:      General: She is not in acute distress.    Appearance: She is well-developed. She is not diaphoretic.  HENT:     Head: Normocephalic and atraumatic.  Eyes:     General: No scleral icterus.       Right eye: No discharge.        Left eye: No discharge.      Conjunctiva/sclera: Conjunctivae normal.  Cardiovascular:     Rate and Rhythm: Normal rate and regular rhythm.  Pulmonary:     Effort: Pulmonary effort is normal. No respiratory distress.  Musculoskeletal:     Cervical back: Normal range of motion.  Feet:     Comments: Right foot: 2nd toe with distal fusiform edema, ulceration, mod TTP. S/p mult toe amputations. 2+ DP/PT.  Left foot: s/p TMT amputation, ulceration lateral forefoot, 2+ DP/PT Skin:    General: Skin is warm and dry.  Neurological:     Mental Status: She is alert.  Psychiatric:        Behavior: Behavior normal.     Assessment/Plan: Bilateral foot  osteo -- Currently scheduled for toe amputation on right and revision TMT amputation on left by Dr. Victorino Dike today. Please keep NPO. 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  Patient is seen and examined.  I agree with the history and physical exam findings documented above.  The patient is a 55 year old female with multiple medical problems including diabetes and chronic high-dose steroids.  She has had the hallux and third toe amputated on the right foot previously.  She has a nonhealing ulcer at the tip of the second toe.  On the left foot she has a history of transmetatarsal amputation with delayed healing of the lateral portion of the wound.  She was recently admitted at [facility] in [place].  MRIs of both feet reveals right second toe osteomyelitis and osteomyelitis of the fifth metatarsal base on the left.  We discussed treatment options today in detail.  At a minimum she needs second toe amputation on the  right.  She asks about proceeding to a transmetatarsal amputation on the right since she will only have fourth and fifth toes left and is worried about injuring them and creating new ulcers.  On the left she will need revision of the lateral part of the transmetatarsal amputation with debridement of the fifth metatarsal.  She understands the risks and benefits of the alternative treatment options and elect surgical treatment.The risks and benefits of the alternative treatment options have been discussed in detail.  The patient wishes to proceed with surgery and specifically understands risks of bleeding, infection, nerve damage, blood clots, need for additional surgery, amputation and death. We will plan on right transmetatarsal amputation and heel cord lengthening as well as revision left transmetatarsal amputation.

## 2021-03-24 NOTE — H&P (Addendum)
History and Physical    [name] ZOX:[id number] DOB: 1966/08/20 DOA: 03/24/2021  Referring MD/NP/PA: Whitney PostJared Segal, MD PCP: Eartha InchBadger, Michael C, MD  [facility]  Chief Complaint: Infection of feet  I have personally briefly reviewed patient's old medical records in [facility]   HPI: Stacey CromerLisa Younce is a 55 y.o. 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  She had been trying to get her early scheduled Rituxan infusion for treatment of her Wegener's granulomatosis 2 days ago when she was noted to be lethargic and acutely altered.  Patient makes it seem like this happens from time to time and is not out of the norm.  However, patient was noted to have soft blood pressures with initial ABG with a pH of 7.2 , PCO2 49.9, and PCO2 76.8.  Symptoms were thought to be multifactorial in nature secondary to multiple high risk medications in addition to patient not using CPAP at night.  She was started on CPAP nightly with improvement in patient's mental status.  Urinalysis was concerning for infection and patient had reported some concerns for dysuria.  She had been initially started on Rocephin IV.  During her initial evaluation she was also noted to have a left lateral foot stump and right second toe wound for which MRI was obtained.  MRI revealed osteomyelitis of the right second toe distal phalanx with nonspecific subcutaneous edema, and osteomyelitis of the left fifth metatarsal remaining base with adjacent ulcer with surrounding nonspecific edema.  Metronidazole was added to her medication regimen.  Dr. Victorino DikeHewitt of orthopedics was  [facility] and agreed to see in consultation.  [facility] called to admit.  ED Course: As seen above  Review of Systems  Constitutional: Negative for malaise/fatigue.  Eyes: Negative for photophobia and pain.  Respiratory: Negative for cough and shortness of breath.   Cardiovascular: Positive for leg swelling. Negative for chest pain.  Gastrointestinal: Negative for abdominal pain, nausea and vomiting.  Musculoskeletal: Positive for joint pain.  Neurological: Negative for focal weakness and loss of consciousness.  Psychiatric/Behavioral: Negative for substance abuse.  All other systems reviewed and are negative.   Past Medical History:  Diagnosis Date  . Chronic cough   . Chronic pain   . Diabetes mellitus without complication (HCC)   . Dyspnea   . GERD (gastroesophageal reflux disease)   . Hypokalemia   . Hypothyroidism   . Neurocardiogenic syncope   . OSA (obstructive sleep apnea)    does not use CPAP  . Osteomyelitis (HCC)    bilateral feet  . Peripheral neuropathy   . Presence of intrathecal pump    recieves Prialt/bupivicaine  . Wegener's granulomatosis 2009  . Wegner's disease (congenital syphilitic osteochondritis)     Past Surgical History:  Procedure Laterality Date  . AMPUTATION Right 10/21/2020   Procedure: Right hallux amputation;  Surgeon: Toni ArthursHewitt, John, MD;  Location: [facility];  Service: Orthopedics;  Laterality: Right;  . AMPUTATION TOE Bilateral 08/12/2020   Procedure: Right 3rd toe amputation; left hallux and 3rd toe amputation;  Surgeon: Toni ArthursHewitt, John, MD;  Location: [facility];  Service: Orthopedics;  Laterality: Bilateral;  45min  .  APPENDECTOMY    . BACK SURGERY    . CHOLECYSTECTOMY    . COLONOSCOPY N/A 06/02/2013   Procedure: COLONOSCOPY;  Surgeon: Graylin Shiver, MD;  Location: [facility];  Service: Endoscopy;  Laterality: N/A;  . HERNIA REPAIR  2000  . INCISION AND DRAINAGE ABSCESS Left 07/07/2016    Procedure: DEBRIDMENT LEFT THIGH ABSCESS, EXISION ACUTE SKIN RASH LEFT THIGH(1CM LESION);  Surgeon: Claud Kelp, MD;  Location: [facility];  Service: General;  Laterality: Left;  . SEPTOPLASTY  02/2013  . spleen anuyism    . splenic aneurysm    . TRANSMETATARSAL AMPUTATION Bilateral 10/21/2020   Procedure: Left transmetatarsal amputation;  Surgeon: Toni Arthurs, MD;  Location: [facility];  Service: Orthopedics;  Laterality: Bilateral;  . VAGINAL HYSTERECTOMY       reports that she has never smoked. She has never used smokeless tobacco. She reports current alcohol use. She reports that she does not use drugs.  Allergies  Allergen Reactions  . Ibuprofen Other (See Comments)    Pt is unable to take this due to kidney problems.   Other reaction(s): Other Not to take with other medications   . Penicillins Rash and Other (See Comments)    Has patient had a PCN reaction causing immediate rash, facial/tongue/throat swelling, SOB or lightheadedness with hypotension: No Has patient had a PCN reaction causing severe rash involving mucus membranes or skin necrosis: No Has patient had a PCN reaction that required hospitalization No Has patient had a PCN reaction occurring within the last 10 years: No If all of the above answers are "NO", then may proceed with Cephalosporin use.    Family History  Problem Relation Age of Onset  . Diabetes Mother   . Heart disease Mother   . Diabetes Father     Prior to Admission medications   Medication Sig Start Date End Date Taking? Authorizing Provider  acetaminophen (TYLENOL) 325 MG tablet Take 2 tablets (650 mg total) by mouth every 6 (six) hours as needed for mild pain (or Fever >/= 101). 07/12/16   Joseph Art, DO  albuterol (PROVENTIL HFA;VENTOLIN HFA) 108 (90 Base) MCG/ACT inhaler Inhale 2 puffs into the lungs every 6 (six) hours as needed for wheezing or shortness of breath.    [provider]  b complex vitamins tablet Take  1 tablet by mouth daily.    [provider]  Calcium Carb-Cholecalciferol (CALCIUM CARBONATE-VITAMIN D3 PO) Take 1 tablet by mouth daily.    [provider]  DULoxetine (CYMBALTA) 60 MG capsule Take 60 mg by mouth 2 (two) times daily.    [provider]  famotidine (PEPCID) 20 MG tablet Take 20 mg daily 01/12/20   [place], Peter M, MD  ferrous sulfate 325 (65 FE) MG tablet Take 325 mg by mouth daily with breakfast.    [provider]  fluticasone (FLONASE) 50 MCG/ACT nasal spray Place 2 sprays into both nostrils daily as needed for rhinitis.    [provider]  folic acid (FOLVITE) 1 MG tablet Take 2 mg by mouth daily.    [provider]  furosemide (LASIX) 40 MG tablet Take 1 tablet (40 mg total) by mouth as directed. 09/06/20   [place], Peter M, MD  gabapentin (NEURONTIN) 300 MG capsule Take 600-900 mg by mouth See admin instructions. Take 3 capsules (900 mg total) by mouth in the morning, 2 capsules (600 mg total) in the early afternoon, and 4 capsules (1200 mg total) in the evening.  [provider]  lidocaine (LIDODERM) 5 % Place 1 patch onto the skin daily as needed (for pain). Remove & Discard patch within 12 hours or as directed by MD    [provider]  magnesium oxide (MAG-OX) 400 MG tablet Take 400 mg by mouth 2 (two) times daily.    [provider]  metFORMIN (GLUCOPHAGE) 500 MG tablet Take 500 mg by mouth 2 (two) times daily with a meal. Patient not taking: Reported on 01/31/2021    [provider]  methotrexate 50 MG/2ML injection Inject 1 mL into the skin once a week.     [provider]  montelukast (SINGULAIR) 10 MG tablet Take 10 mg by mouth daily. 12/30/17   [provider]  Potassium Chloride Crys ER (K-DUR PO) Take 40 mEq by mouth 3 (three) times daily.    [provider]  predniSONE (DELTASONE) 5 MG tablet Take 7.5 mg by mouth daily. 12/25/20   [provider]   tapentadol (NUCYNTA) 50 MG tablet Take 50 mg by mouth 2 (two) times daily.    [provider]  tizanidine (ZANAFLEX) 2 MG capsule Take 2 mg by mouth 3 (three) times daily as needed for muscle spasms. 05/10/17   [provider]  topiramate (TOPAMAX) 100 MG tablet Take 100 mg by mouth daily. 01/14/18   [provider]  traZODone (DESYREL) 100 MG tablet Take 1 tablet (100 mg total) by mouth at bedtime. 01/12/20   [place], Peter M, MD  vitamin B-12 (CYANOCOBALAMIN) 1000 MCG tablet Take 1,000 mcg by mouth daily.    [provider]  Vitamin D, Ergocalciferol, (DRISDOL) 50000 units CAPS capsule Take 50,000 Units by mouth every 7 (seven) days. Pt takes on Friday.    [provider]  ZICONOTIDE ACETATE IT by Intrathecal route every 3 (three) months. Unknown dose    [provider]    Physical Exam:  Constitutional: NAD, calm, comfortable Vitals:   03/24/21 1038  BP: 105/70  Pulse: 64  Resp: 16  Temp: 98.6 F (37 C)  TempSrc: Oral  SpO2: 97%   Eyes: PERRL, lids and conjunctivae normal ENMT: Mucous membranes are moist. Posterior pharynx clear of any exudate or lesions.Normal dentition.  Neck: normal, supple, no masses, no thyromegaly Respiratory: clear to auscultation bilaterally, no wheezing, no crackles. Normal respiratory effort. No accessory muscle use.  Cardiovascular: Regular rate and rhythm, no murmurs / rubs / gallops. No extremity edema. 2+ pedal pulses. No carotid bruits.  Abdomen: no tenderness, no masses palpated. No hepatosplenomegaly. Bowel sounds positive.  Musculoskeletal: no clubbing / cyanosis. No joint deformity upper and lower extremities. Good ROM, no contractures. Normal muscle tone.  Skin: no rashes, lesions, ulcers. No induration Neurologic: CN 2-12 grossly intact. Sensation intact, DTR normal. Strength 5/5 in all 4.  Psychiatric: Normal judgment and insight. Alert and oriented x 3. Normal mood.     Labs on Admission:  I have personally reviewed following labs and imaging studies  CBC: No results for input(s): WBC, NEUTROABS, HGB, HCT, MCV, PLT in the last 168 hours. Basic Metabolic Panel: No results for input(s): NA, K, CL, CO2, GLUCOSE, BUN, CREATININE, CALCIUM, MG, PHOS in the last 168 hours. GFR: CrCl cannot be calculated (Patient's most recent lab result is older than the maximum 21 days allowed.). Liver Function Tests: No results for input(s): AST, ALT, ALKPHOS, BILITOT, PROT, ALBUMIN in the last 168 hours. No results for input(s): LIPASE, AMYLASE in the last 168 hours. No results for input(s):  AMMONIA in the last 168 hours. Coagulation Profile: No results for input(s): INR, PROTIME in the last 168 hours. Cardiac Enzymes: No results for input(s): CKTOTAL, CKMB, CKMBINDEX, TROPONINI in the last 168 hours. BNP (last 3 results) No results for input(s): PROBNP in the last 8760 hours. HbA1C: No results for input(s): HGBA1C in the last 72 hours. CBG: No results for input(s): GLUCAP in the last 168 hours. Lipid Profile: No results for input(s): CHOL, HDL, LDLCALC, TRIG, CHOLHDL, LDLDIRECT in the last 72 hours. Thyroid Function Tests: No results for input(s): TSH, T4TOTAL, FREET4, T3FREE, THYROIDAB in the last 72 hours. Anemia Panel: No results for input(s): VITAMINB12, FOLATE, FERRITIN, TIBC, IRON, RETICCTPCT in the last 72 hours. Urine analysis:    Component Value Date/Time   COLORURINE STRAW (A) 11/01/2020 1325   APPEARANCEUR CLEAR 11/01/2020 1325   LABSPEC 1.006 11/01/2020 1325   PHURINE 6.0 11/01/2020 1325   GLUCOSEU NEGATIVE 11/01/2020 1325   HGBUR NEGATIVE 11/01/2020 1325   BILIRUBINUR NEGATIVE 11/01/2020 1325   KETONESUR NEGATIVE 11/01/2020 1325   PROTEINUR NEGATIVE 11/01/2020 1325   UROBILINOGEN 0.2 05/30/2013 1112   NITRITE NEGATIVE 11/01/2020 1325   LEUKOCYTESUR TRACE (A) 11/01/2020 1325   Sepsis Labs: No results found for this or any previous visit (from the past 240 hour(s)).    Radiological Exams on Admission: No results found.  Assessment/Plan  Osteomyelitis: Acute on chronic.  Patient found to have osteomyelitis of the left fifth remaining metatarsal head as well as the right second toe at the outside facility.  Patient had been on Rocephin already to treat a urinary tract infection and appears metronidazole was added on.  Orthopedics plan on taking patient to surgery today for likely amputation. -Admit to a medical telemetry bed -Lower extremity wound order set utilized   -N.p.o. -Continue empiric antibiotics of metronidazole, and Rocephin  -Orthopedics consulted, will follow-up for further recommendations -Recheck CBC tomorrow morning  Urinary tract infection: Present on admission at outside facility.  On 5/31 with positive nitrites, positive leukocyte esterase, 20-35 WBCs per high-power field, and 3+ bacteria.  Patient has been started on Rocephin IV on 5/31. -Continue Rocephin IV  Leukocytosis: Acute.  WBC elevated at 12.1.  Suspect secondary to above -Recheck CBC in a.m.  Diabetes mellitus type 2: At home patient only on metformin 500 mg twice daily.  Blood sugars have been well controlled here. -Hypoglycemic protocol -Follow-up hemoglobin A1c -Hold metformin -CBGs before every meal and at bedtime  -Consider starting patient on sliding scale if needed  Normocytic anemia: Acute on chronic.  Hemoglobin 9.1 g/dL on admission. -Recheck CBC in a.m.  Wegener's granulomatosis  ANCA associated vasculitis: Patient is on Rituxan infusions, but was unable to get her last infusion scheduled on 5/31 due to confusion.  Home regimen includes prednisone 7.5 mg daily, methotrexate, Rituxan infusions.  She is followed in the outpatient setting by Dr. Judie Petit of rheumatology at [facility]. -Continue prednisone and folate acid  Hypokalemia: Acute.  Initial potassium 3.1. -Give potassium chloride 40 p.o. -Continue to monitor and replace as needed  Chronic  pain -Continue Nucynta, gabapentin and Cymbalta  OSA: Patient had not been compliant with CPAP at home. -Continue CPAP at night -Needs outpatient sleep study referral  GERD -Continue Pepcid  Obesity: BMI 32.59 kg/m  DVT prophylaxis: lovenox  Code Status: Full Family Communication: Family updated at bedside Disposition Plan: To be determined Consults called: Orthopedics Admission status: Inpatient require more than 2 midnight stay  Clydie Braun MD [facility]  If 7PM-7AM, please contact night-coverage   03/24/2021, 10:44 AM

## 2021-03-24 NOTE — Progress Notes (Signed)
Orthopedic Tech Progress Note Patient Details:  [name] [birth year] [id number]  Ortho Devices Type of Ortho Device: CAM walker Ortho Device/Splint Interventions: Ordered   Post Interventions Patient Tolerated: Other (comment) Instructions Provided: Other (comment)   Michelle Piper 03/24/2021, 4:36 PM

## 2021-03-24 NOTE — Progress Notes (Signed)
Orthopedic Tech Progress Note Patient Details:  [name] Mar 08, 1966 [id number]  Ortho Devices Type of Ortho Device: Postop shoe/boot Ortho Device/Splint Location: LLE Ortho Device/Splint Interventions: Ordered   Post Interventions Patient Tolerated: Other (comment) Instructions Provided: Other (comment)   Michelle Piper 03/24/2021, 6:15 PM

## 2021-03-24 NOTE — Anesthesia Procedure Notes (Signed)
Procedure Name: Intubation Date/Time: 03/24/2021 3:59 PM Performed by: Lynnell Chad, CRNA Pre-anesthesia Checklist: Patient identified, Emergency Drugs available, Suction available and Patient being monitored Patient Re-evaluated:Patient Re-evaluated prior to induction Oxygen Delivery Method: Circle System Utilized Preoxygenation: Pre-oxygenation with 100% oxygen Induction Type: IV induction Ventilation: Mask ventilation without difficulty Laryngoscope Size: Miller and 2 Grade View: Grade I Tube type: Oral Tube size: 7.0 mm Number of attempts: 1 Airway Equipment and Method: Stylet and Oral airway Placement Confirmation: ETT inserted through vocal cords under direct vision,  positive ETCO2 and breath sounds checked- equal and bilateral Secured at: 21 cm Tube secured with: Tape Dental Injury: Teeth and Oropharynx as per pre-operative assessment

## 2021-03-24 NOTE — Progress Notes (Signed)
Pt admitted to 3W AxOx4, VS wnL and as per flow. Pt oriented to 3W processes. Pt familiar with the [facility] system. MD paged. Awaiting orders. All questions and concerns addressed. Call bell placed within reach, will continue to monitor and maintain safety.

## 2021-03-24 NOTE — Anesthesia Preprocedure Evaluation (Addendum)
Anesthesia Evaluation  Patient identified by MRN, date of birth, ID band Patient awake    Reviewed: Allergy & Precautions, NPO status , Patient's Chart, lab work & pertinent test results  History of Anesthesia Complications Negative for: history of anesthetic complications  Airway Mallampati: III  TM Distance: >3 FB Neck ROM: Full    Dental no notable dental hx. (+) Teeth Intact   Pulmonary shortness of breath and with exertion, sleep apnea ,  weagners   Pulmonary exam normal breath sounds clear to auscultation       Cardiovascular hypertension, Pt. on medications + Peripheral Vascular Disease   Rhythm:Regular Rate:Normal - Systolic murmurs EKG 09/06/20 NSR, normal   Neuro/Psych  Headaches, PSYCHIATRIC DISORDERS Depression Chronic pain syndrome Intrathecal pump- malfunctioning Peripheral neuropathy  Neuromuscular disease (peripheral neuropathy)    GI/Hepatic Neg liver ROS, GERD  Medicated,  Endo/Other  diabetes, Well Controlled, Type 2Hypothyroidism Obesity Adrenal insufficiency Impaired fasting glucose  Renal/GU Renal disease  negative genitourinary   Musculoskeletal  (+) Arthritis , Osteoarthritis,  Wegner's Granulomatosus   Abdominal (+) + obese,   Peds negative pediatric ROS (+)  Hematology negative hematology ROS (+) anemia ,   Anesthesia Other Findings   Reproductive/Obstetrics negative OB ROS Hx/o congenital syphilis                             Anesthesia Physical  Anesthesia Plan  ASA: III  Anesthesia Plan: General   Post-op Pain Management:    Induction: Intravenous  PONV Risk Score and Plan: 3 and Ondansetron, Dexamethasone, Midazolam and Treatment may vary due to age or medical condition  Airway Management Planned: Oral ETT  Additional Equipment:   Intra-op Plan:   Post-operative Plan: Extubation in OR  Informed Consent: I have reviewed the patients  History and Physical, chart, labs and discussed the procedure including the risks, benefits and alternatives for the proposed anesthesia with the patient or authorized representative who has indicated his/her understanding and acceptance.     Dental advisory given  Plan Discussed with: CRNA and Anesthesiologist  Anesthesia Plan Comments:         Anesthesia Quick Evaluation

## 2021-03-24 NOTE — Progress Notes (Signed)
Pt reports to have had blueberry pancakes and eggs for breakfast today at 0800. Dr. Glade Stanford notified - surgery to be delayed until 1600. OR desk notified. No further orders.

## 2021-03-24 NOTE — Op Note (Signed)
03/24/2021  5:26 PM  PATIENT:  [name]  55 y.o. female  PRE-OPERATIVE DIAGNOSIS: 1.  Left lateral midfoot diabetic ulcer with osteomyelitis      2.  Right 2nd toe diabetic ulcer with osteomyelitis  POST-OPERATIVE DIAGNOSIS:  1.  Left lateral midfoot diabetic ulcer with osteomyelitis      2.  Right 2nd toe diabetic ulcer with osteomyelitis      3.  Short right achilles tendon  Procedure(s): 1.  Right achilles tendon percutaneous lengthening   2.  Right foot transmetatarsal amputation   3.  Left 5th MT ray amputation  SURGEON:  Toni Arthurs, MD  ASSISTANT: Alfredo Martinez, PA-C  ANESTHESIA:   General, regional  EBL:  minimal   TOURNIQUET:   Total Tourniquet Time Documented: Thigh (Right) - 19 minutes Total: Thigh (Right) - 19 minutes Left ankle esmarch for 10 min  COMPLICATIONS:  None apparent  DISPOSITION:  Extubated, awake and stable to recovery.  INDICATION FOR PROCEDURE: The patient is a 55 year old female with a past medical history significant for Wegener's granulomatosis requiring high-dose steroids and diabetes.  She is known to me from previous right hallux and third toe amputations and left transmetatarsal amputation.  She was recently admitted to [facility] in [place] with altered mental status.  She was found to have an urinary tract infection as well as right second toe and left foot diabetic ulcers.  MRIs were obtained revealing osteomyelitis of the fifth metatarsal of the left foot and of the second toe on the right foot.  She presents now for surgical treatment of both feet.  She requests transmetatarsal amputation on the right in lieu of second toe amputation since she will only have 2 remaining toes.  She will also need excision of her left lateral midfoot ulcer and fifth ray amputation.  The risks and benefits of the alternative treatment options have been discussed in detail.  The patient wishes to proceed with surgery and specifically understands risks of  bleeding, infection, nerve damage, blood clots, need for additional surgery, amputation and death.  PROCEDURE IN DETAIL: After preoperative consent was obtained and the correct operative sites were identified, the patient was brought to the operating room and placed supine on the operating table.  General anesthesia was induced.  Preoperative antibiotics were administered.  Surgical timeout was taken.  Both lower extremities were prepped and draped in standard sterile fashion.  The right lower extremity was addressed first.  It was exsanguinated and a thigh tourniquet inflated to 300 mmHg.  The heel cord was noted to be tight.  The decision was made to proceed with percutaneous Achilles tendon lengthening.  A triple hemisection tenotomy was then performed with a #15 scalpel.  The ankle would then dorsiflex 20 degrees with the knee extended.    A fishmouth incision was made around the forefoot just proximal from the metatarsal heads.  Dissection was carried sharply down through the subcutaneous tissues to the metatarsals.  Subperiosteal dissection was carried dorsally elevating a full-thickness flap.  The oscillating saw was used to cut through the 5 metatarsal shafts beveling the cuts appropriately.  The plantar soft tissues were then divided, and the forefoot was passed off the field.  The vascular structures were cauterized.  The wound was irrigated copiously.  Cut surfaces of bone were smoothed.  Vancomycin powder was sprinkled in the wound.  The incision was then closed with simple and horizontal mattress sutures of 3-0 nylon.  Sterile dressings were applied followed by a  compression wrap.  The tourniquet was released after application of the dressings.  Attention was then turned to the left lower extremity.  The foot was exsanguinated and a 4 inch Esmarch tourniquet wrapped around the ankle.  The lateral ulcer was identified.  An ellipsoid incision was made around the ulcer excising it entirely.  The  incision was extended proximally and distally.  The wound was sharply excised down to its base.  There was no extension to the adjacent metatarsal bone.  The fifth metatarsal was then exposed subperiosteally.  A rondure was used to debride the bone back to the metaphyseal flare.  The bone appeared generally healthy.  The wound was irrigated copiously.  Deep tissue was sent as specimens to microbiology for aerobic and anaerobic culture.  Vancomycin powder were sprinkled in the wound.  The incision was closed with simple and horizontal mattress sutures of 3-0 nylon.  Sterile dressings were applied followed by compression wrap.  The tourniquet was released after application of the dressings.  The patient was awakened from anesthesia and transported to the recovery room in stable condition.  FOLLOW UP PLAN: Weightbearing as tolerated on the right foot in the cam boot.  Weightbearing as tolerated on the left foot in a flat postop shoe.  Physical therapy for gait training with a walker.  Continue IV antibiotics pending culture results.

## 2021-03-24 NOTE — Transfer of Care (Signed)
Immediate Anesthesia Transfer of Care Note  Patient: [name]  Procedure(s) Performed: TRANSMETATARSAL AMPUTATION (Left Toe) AMPUTATION TOE (Right Toe)  Patient Location: PACU  Anesthesia Type:General  Level of Consciousness: awake, alert  and patient cooperative  Airway & Oxygen Therapy: Patient Spontanous Breathing  Post-op Assessment: Report given to RN and Post -op Vital signs reviewed and stable  Post vital signs: Reviewed and stable  Last Vitals:  Vitals Value Taken Time  BP 131/75 03/24/21 1711  Temp    Pulse 76 03/24/21 1715  Resp 23 03/24/21 1715  SpO2 96 % 03/24/21 1715  Vitals shown include unvalidated device data.  Last Pain:  Vitals:   03/24/21 1300  TempSrc: Oral  PainSc:       Patients Stated Pain Goal: 0 (03/24/21 1038)  Complications: No complications documented.

## 2021-03-24 NOTE — Anesthesia Postprocedure Evaluation (Signed)
Anesthesia Post Note  Patient: [name]  Procedure(s) Performed: TRANSMETATARSAL AMPUTATION (Left Toe) AMPUTATION TOE (Right Toe)     Patient location during evaluation: PACU Anesthesia Type: General Level of consciousness: awake and alert Pain management: pain level controlled Vital Signs Assessment: post-procedure vital signs reviewed and stable Respiratory status: spontaneous breathing, nonlabored ventilation, respiratory function stable and patient connected to nasal cannula oxygen Cardiovascular status: blood pressure returned to baseline and stable Postop Assessment: no apparent nausea or vomiting Anesthetic complications: no   No complications documented.  Last Vitals:  Vitals:   03/24/21 1801 03/24/21 1935  BP: 111/67 108/63  Pulse: 69 68  Resp: 16 16  Temp: 36.8 C 36.7 C  SpO2: 94% 95%    Last Pain:  Vitals:   03/24/21 1935  TempSrc: Oral  PainSc: 7                  Cecile Hearing

## 2021-03-24 NOTE — Progress Notes (Signed)
Pharmacy Antibiotic Note  [name] is a 55 y.o. female admitted on 03/24/2021 with suspected diabetic foot infection.  Pharmacy has been consulted for Vancomycin  dosing.  Plan: Vancomycin 1750 mg iv x 1 then 1250 Q 24 Also on Ceftriaxone and Metronidazole Follow up Scr, progress, cultures  Height: 5\' 6"  (167.6 cm) Weight: 91.6 kg (201 lb 15.1 oz) IBW/kg (Calculated) : 59.3  Temp (24hrs), Avg:98.6 F (37 C), Min:98.6 F (37 C), Max:98.6 F (37 C)  No results for input(s): WBC, CREATININE, LATICACIDVEN, VANCOTROUGH, VANCOPEAK, VANCORANDOM, GENTTROUGH, GENTPEAK, GENTRANDOM, TOBRATROUGH, TOBRAPEAK, TOBRARND, AMIKACINPEAK, AMIKACINTROU, AMIKACIN in the last 168 hours.  CrCl cannot be calculated (Patient's most recent lab result is older than the maximum 21 days allowed.).    Allergies  Allergen Reactions  . Ibuprofen Other (See Comments)    Pt is unable to take this due to kidney problems.   Other reaction(s): Other Not to take with other medications   . Penicillins Rash and Other (See Comments)    Has patient had a PCN reaction causing immediate rash, facial/tongue/throat swelling, SOB or lightheadedness with hypotension: No Has patient had a PCN reaction causing severe rash involving mucus membranes or skin necrosis: No Has patient had a PCN reaction that required hospitalization No Has patient had a PCN reaction occurring within the last 10 years: No If all of the above answers are "NO", then may proceed with Cephalosporin use.     Thank you for allowing pharmacy to be a part of this patient's care.  Stacey, [name] 03/24/2021 12:14 PM

## 2021-03-24 NOTE — Consult Note (Signed)
WOC Nurse Consult Note: Reason for Consult:Chronic, nonhealing wounds to bilateral arms and lower legs.  Unclear etiology, seen at wound care center.  Last visit 03/09/21. Is going to surgery today for transmetatarsal amputation, to include osteomyelitis in left lateral foot, previous TMA to this limb.  In addition, she is having an amputation of right third metatarsal today due to chronic osteomyelitis. Has a nonhealing burn to right thigh as well.  Wound type: See above.   Pressure Injury POA: A Measurement: Right thigh:  1.5 cm x 1 cm x 0.2 cm  Left lateral foot:  0.3 cm opening, distal end of amputation site that never closed.  Nonintact lesions to bilateral arms and legs 1 cm round.  Wound bed:red and moist Drainage (amount, consistency, odor) minimal serosanguinous  No odor.  Periwound:intact  Dressing procedure/placement/frequency: Cleanse wounds to bilateral arms and legs and right thigh with NS and pat dry.  Apply mupirocin ointment to open wounds and cover with silicone foam dressings.  Re-apply ointment daily, change foam every three days and PRN soilage  Silicone foam dressings to left foot and right toe for now until surgery later today.  Will not follow at this time.  Please re-consult if needed.  Maple Hudson MSN, RN, [name]-BC CWON Wound, Ostomy, Continence Nurse Pager [phone]

## 2021-03-25 ENCOUNTER — Encounter (HOSPITAL_COMMUNITY): Payer: Self-pay | Admitting: Orthopedic Surgery

## 2021-03-25 DIAGNOSIS — N1831 Chronic kidney disease, stage 3a: Secondary | ICD-10-CM

## 2021-03-25 LAB — CBC
HCT: 27.1 % — ABNORMAL LOW (ref 36.0–46.0)
Hemoglobin: 8.2 g/dL — ABNORMAL LOW (ref 12.0–15.0)
MCH: 26.8 pg (ref 26.0–34.0)
MCHC: 30.3 g/dL (ref 30.0–36.0)
MCV: 88.6 fL (ref 80.0–100.0)
Platelets: 312 10*3/uL (ref 150–400)
RBC: 3.06 MIL/uL — ABNORMAL LOW (ref 3.87–5.11)
RDW: 17 % — ABNORMAL HIGH (ref 11.5–15.5)
WBC: 11.3 10*3/uL — ABNORMAL HIGH (ref 4.0–10.5)
nRBC: 0 % (ref 0.0–0.2)

## 2021-03-25 LAB — GLUCOSE, CAPILLARY
Glucose-Capillary: 91 mg/dL (ref 70–99)
Glucose-Capillary: 95 mg/dL (ref 70–99)
Glucose-Capillary: 149 mg/dL — ABNORMAL HIGH (ref 70–99)
Glucose-Capillary: 129 mg/dL — ABNORMAL HIGH (ref 70–99)

## 2021-03-25 LAB — BASIC METABOLIC PANEL
Anion gap: 3 — ABNORMAL LOW (ref 5–15)
BUN: 6 mg/dL (ref 6–20)
CO2: 23 mmol/L (ref 22–32)
Calcium: 8.1 mg/dL — ABNORMAL LOW (ref 8.9–10.3)
Chloride: 115 mmol/L — ABNORMAL HIGH (ref 98–111)
Creatinine, Ser: 0.86 mg/dL (ref 0.44–1.00)
GFR, Estimated: >60 mL/min (ref >60)
Glucose, Bld: 109 mg/dL — ABNORMAL HIGH (ref 70–99)
Potassium: 3.7 mmol/L (ref 3.5–5.1)
Sodium: 141 mmol/L (ref 135–145)

## 2021-03-25 LAB — HEMOGLOBIN A1C
Hgb A1c MFr Bld: 5.9 % — ABNORMAL HIGH (ref 4.8–5.6)
Mean Plasma Glucose: 123 mg/dL

## 2021-03-25 MED ORDER — SODIUM CHLORIDE 0.9 % IV BOLUS
500.0000 mL | Freq: Once | INTRAVENOUS | Status: AC
  Administered 2021-03-25: 500 mL via INTRAVENOUS

## 2021-03-25 MED ORDER — PANTOPRAZOLE SODIUM 40 MG PO TBEC
40.0000 mg | DELAYED_RELEASE_TABLET | Freq: Every day | ORAL | Status: DC
  Administered 2021-03-25 – 2021-03-30 (×6): 40 mg via ORAL
  Filled 2021-03-25 (×6): qty 1

## 2021-03-25 MED ORDER — OXYCODONE HCL 5 MG PO TABS: 5.0000 mg | ORAL_TABLET | ORAL | 0 refills | Status: AC | PRN

## 2021-03-25 MED ORDER — SENNA 8.6 MG PO TABS: 2.0000 | ORAL_TABLET | Freq: Two times a day (BID) | ORAL | 0 refills | Status: DC

## 2021-03-25 MED ORDER — BACID PO TABS
2.0000 | ORAL_TABLET | Freq: Three times a day (TID) | ORAL | Status: DC
  Filled 2021-03-25 (×2): qty 2

## 2021-03-25 MED ORDER — DOCUSATE SODIUM 100 MG PO CAPS: 100.0000 mg | ORAL_CAPSULE | Freq: Two times a day (BID) | ORAL | 0 refills | Status: DC

## 2021-03-25 MED ORDER — SODIUM CHLORIDE 0.9 % IV SOLN: INTRAVENOUS | Status: AC

## 2021-03-25 MED ORDER — RISAQUAD PO CAPS
2.0000 | ORAL_CAPSULE | Freq: Three times a day (TID) | ORAL | Status: DC
  Administered 2021-03-25 – 2021-03-30 (×17): 2 via ORAL
  Filled 2021-03-25 (×18): qty 2

## 2021-03-25 NOTE — Progress Notes (Signed)
OT Cancellation Note  Patient Details Name: Audery Wassenaar MRN: [id number] DOB: 1966/10/11   Cancelled Treatment:    Reason Eval/Treat Not Completed: Other (comment) (Per RN request, hold as BP low. Will return as schedule allows.)  Khushi Zupko M Margret Moat Egidio Lofgren MSOT, OTR/L Acute Rehab Pager: [phone] Office: [phone] 03/25/2021, 9:43 AM

## 2021-03-25 NOTE — Progress Notes (Signed)
PT Cancellation Note  Patient Details Name: [name] MRN: [id number] DOB: [birth year]   Cancelled Treatment:    Reason Eval/Treat Not Completed: Patient not medically ready. Per RN, patient has low BP and would like for PT to hold until she can get more fluids. Will re-attempt later.    Juancarlos Crescenzo 03/25/2021, 9:51 AM

## 2021-03-25 NOTE — Progress Notes (Addendum)
PROGRESS NOTE  [name] QIO:[id number] DOB: [birth year] DOA: 03/24/2021 PCP: Eartha Inch, MD   LOS: 1 day   Brief narrative:  [name] is a 55 y.o. 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  She had been trying to get her early scheduled Rituxan infusion for treatment of her Wegener's granulomatosis 2 days ago when she was noted to be lethargic and acutely altered.  In the ED initial ABG showed pH of 7.2 , PCO2 49.9, and PCO2 76.8.  Symptoms were thought to be multifactorial in nature secondary to multiple high risk medications in addition to patient not using CPAP at night.  She was started on CPAP nightly with improvement in patient's mental status.  Urinalysis was concerning for infection and patient had reported some concerns for dysuria.  She had been initially started on Rocephin IV.  During her initial evaluation she was also noted to have a left lateral foot stump and right second toe wound for which MRI was obtained.  MRI revealed osteomyelitis of the right second toe distal phalanx with nonspecific subcutaneous edema, and osteomyelitis of the left fifth metatarsal remaining base with adjacent ulcer with surrounding nonspecific edema.  Metronidazole was added to her medication regimen.  Dr. Victorino Dike of orthopedics was consulted and patient was transferred to [facility] for further evaluation and treatment..  Assessment/Plan:  Principal Problem:   Osteomyelitis (HCC) Active Problems:   OSA (obstructive sleep apnea)   CKD (chronic kidney disease) stage 3, GFR 30-59 ml/min (HCC)   Normocytic anemia   Obesity (BMI 30-39.9)   ANCA-associated  vasculitis (HCC)   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)   Chronic pain  Acute on chronic osteomyelitis:  Patient was found to have osteomyelitis of the left fifth  metatarsal head as well as right second toe at the outside facility.  Currently on Rocephin and metronidazole.  Status post transmetatarsal amputation.  Continue continue empiric antibiotics at this time.  Orthopedics on board.   Urinary tract infection: Present on admission at outside facility.  On 5/31 patient had abnormal urinalysis.  Continue IV Rocephin, started on 531.  Leukocytosis: Mild secondary to osteomyelitis.  Diabetes mellitus type 2:  Latest hemoglobin A1c of 5.9.  Metformin on hold.  Continue sliding scale insulin Accu-Cheks diabetic diet.  Normocytic anemia: No bleeding.  Likely anemia of chronic disease as well.  Closely monitor.  Wegener's granulomatosis  ANCA associated vasculitis: Patient is on Rituxan infusions, but was unable to get her last infusion scheduled on 5/31 due to confusion.  Home regimen includes prednisone 7.5 mg daily, methotrexate, Rituxan infusions.  She is followed in the outpatient setting by Dr. Judie Petit of rheumatology at [facility].  At present we will continue with prednisone and folic acid.  Hypokalemia:  Improved with replacement.  Chronic pain On Nucynta, gabapentin and Cymbalta.  We will continue with that  OSA:   Noncompliance.  Has been using it.  Continue CPAP.  Will need sleep study referral  GERD On Pepcid  Mild diarrhea epigastric discomfort.  Likely antibiotic associated.  Add Protonix.  Add probiotic.  Dizziness, hypotension postural.  Likely volume depletion.  Continue with IV fluid hydration.  We will give a  normal saline bolus as well.  Obesity: BMI 32.59 kg/m.  Would benefit from lifestyle modification and weight loss  DVT prophylaxis: SCD's Start: 03/24/21 2024 SCDs Start: 03/24/21 1813    Code Status: Full code  Family  Communication: None  Status is: Inpatient  Remains inpatient appropriate because:IV treatments appropriate due to intensity of illness or inability to take PO and Inpatient level of care appropriate due to severity of illness   Dispo: The patient is from: Home              Anticipated d/c is to: Home              Patient currently is not medically stable to d/c.   Difficult to place patient No   Consultants:  Orthopedics  Procedures:  Right Achilles tendon percutaneous lengthening, right foot trans-metatarsal amputation, left fifth metatarsal ray amputation on 03/24/2021 by Dr. Arnoldo Hooker  Anti-infectives:  Marland Kitchen Vancomycin, Rocephin, Metro  Anti-infectives (From admission, onward)   Start     Dose/Rate Route Frequency Ordered Stop   03/25/21 1200  vancomycin (VANCOREADY) IVPB 1250 mg/250 mL  Status:  Discontinued        1,250 mg 166.7 mL/hr over 90 Minutes Intravenous Every 24 hours 03/24/21 1216 03/24/21 1249   03/25/21 0600  vancomycin (VANCOCIN) IVPB 1000 mg/200 mL premix        1,000 mg 200 mL/hr over 60 Minutes Intravenous On call to O.R. 03/24/21 1459 03/24/21 1608   03/24/21 1625  vancomycin (VANCOCIN) powder  Status:  Discontinued          As needed 03/24/21 1626 03/24/21 1708   03/24/21 1215  cefTRIAXone (ROCEPHIN) 2 g in sodium chloride 0.9 % 100 mL IVPB        2 g 200 mL/hr over 30 Minutes Intravenous Every 24 hours 03/24/21 1126     03/24/21 1215  metroNIDAZOLE (FLAGYL) IVPB 500 mg        500 mg 100 mL/hr over 60 Minutes Intravenous Every 8 hours 03/24/21 1126     03/24/21 1215  vancomycin (VANCOREADY) IVPB 1750 mg/350 mL  Status:  Discontinued        1,750 mg 175 mL/hr over 120 Minutes Intravenous  Once 03/24/21 1128 03/24/21 1249      Subjective: Today, patient was seen and examined at bedside.  Patient complains of moderate pain over the foot.  Some loose stools.  Complains of mild epigastric discomfort.  Nursing staff reported that the patient was  hypotensive patient however denies any chest pain shortness of breath but dizziness on sitting up.  Objective: Vitals:   03/25/21 0347 03/25/21 0905  BP: (!) 88/54 (!) 78/56  Pulse: 60 64  Resp:  12  Temp:  97.8 F (36.6 C)  SpO2:  92%    Intake/Output Summary (Last 24 hours) at 03/25/2021 1104 Last data filed at 03/25/2021 0401 Gross per 24 hour  Intake 1640.16 ml  Output --  Net 1640.16 ml   Filed Weights   03/24/21 1038  Weight: 91.6 kg   Body mass index is 32.59 kg/m.   Physical Exam: GENERAL: Patient is alert awake and oriented. Not in obvious distress.  Obese HENT: No scleral pallor or icterus. Pupils equally reactive to light. Oral mucosa is moist NECK: is supple, no gross swelling noted. CHEST: Clear to auscultation. No crackles or wheezes.  Diminished breath sounds bilaterally. CVS: S1 and S2 heard, no murmur. Regular rate and rhythm.  ABDOMEN: Soft, non-tender, bowel sounds are present.  Mild epigastric discomfort. EXTREMITIES: No edema.  Bilateral foot wrapped. CNS: Cranial nerves are intact. No focal motor deficits. SKIN: warm and dry, bilateral foot on wraps.  Data Review: I have personally reviewed the following laboratory data and studies,  CBC: Recent Labs  Lab 03/24/21 1207 03/25/21 0058  WBC 12.1* 11.3*  NEUTROABS 8.7*  --   HGB 9.1* 8.2*  HCT 30.1* 27.1*  MCV 88.3 88.6  PLT 337 312   Basic Metabolic Panel: Recent Labs  Lab 03/24/21 1207 03/25/21 0058  NA 140 141  K 3.1* 3.7  CL 115* 115*  CO2 21* 23  GLUCOSE 162* 109*  BUN 7 6  CREATININE 0.97 0.86  CALCIUM 7.9* 8.1*   Liver Function Tests: Recent Labs  Lab 03/24/21 1207  AST 22  ALT 16  ALKPHOS 60  BILITOT 0.7  PROT 4.9*  ALBUMIN 2.8*   No results for input(s): LIPASE, AMYLASE in the last 168 hours. No results for input(s): AMMONIA in the last 168 hours. Cardiac Enzymes: No results for input(s): CKTOTAL, CKMB, CKMBINDEX, TROPONINI in the last 168 hours. BNP (last 3  results) No results for input(s): BNP in the last 8760 hours.  ProBNP (last 3 results) No results for input(s): PROBNP in the last 8760 hours.  CBG: Recent Labs  Lab 03/24/21 1305 03/24/21 1452 03/24/21 1711 03/24/21 2108 03/25/21 0609  GLUCAP 110* 134* 127* 145* 91   Recent Results (from the past 240 hour(s))  SARS CORONAVIRUS 2 (TAT 6-24 HRS) Nasopharyngeal Nasopharyngeal Swab     Status: None   Collection Time: 03/24/21 12:37 PM   Specimen: Nasopharyngeal Swab  Result Value Ref Range Status   SARS Coronavirus 2 NEGATIVE NEGATIVE Final    Comment: (NOTE) SARS-CoV-2 target nucleic acids are NOT DETECTED.  The SARS-CoV-2 RNA is generally detectable in upper and lower respiratory specimens during the acute phase of infection. Negative results do not preclude SARS-CoV-2 infection, do not rule out co-infections with other pathogens, and should not be used as the sole basis for treatment or other patient management decisions. Negative results must be combined with clinical observations, patient history, and epidemiological information. The expected result is Negative.  Fact Sheet for Patients: HairSlick.no  Fact Sheet for Healthcare Providers: quierodirigir.com  This test is not yet approved or cleared by the [place] FDA and  has been authorized for detection and/or diagnosis of SARS-CoV-2 by FDA under an Emergency Use Authorization (EUA). This EUA will remain  in effect (meaning this test can be used) for the duration of the COVID-19 declaration under Se ction 564(b)(1) of the Act, 21 U.S.C. section 360bbb-3(b)(1), unless the authorization is terminated or revoked sooner.  Performed at [facility], [address], [place], [place] [address]   Surgical pcr screen     Status: None   Collection Time: 03/24/21  4:23 PM   Specimen: Nasal Mucosa; Nasal Swab  Result Value Ref Range Status   MRSA, PCR  NEGATIVE NEGATIVE Final   Staphylococcus aureus NEGATIVE NEGATIVE Final    Comment: (NOTE) The Xpert SA Assay (FDA approved for NASAL specimens in patients 67 years of age and older), is one component of a comprehensive surveillance program. It is not intended to diagnose infection nor to guide or monitor treatment. Performed at [facility], [address], [place], [place] [address]      Studies: No results found.    Joycelyn Das, MD  [facility] 03/25/2021  If 7PM-7AM, please contact night-coverage

## 2021-03-25 NOTE — Progress Notes (Signed)
Subjective: 1 Day Post-Op Procedure(s) (LRB): TRANSMETATARSAL AMPUTATION (Left) AMPUTATION TOE (Right)  Patient reports pain as mild to moderate.  Denies fever, chills, N/V, CP, SOB.  Tolerating POs well.  Admits to flatus.  Patient had a hard time keeping her eyes open when I saw her this morning.  Objective:   VITALS:  Temp:  [97.1 F (36.2 C)-98.9 F (37.2 C)] 98.4 F (36.9 C) (06/03 0334) Pulse Rate:  [58-84] 60 (06/03 0347) Resp:  [12-19] 19 (06/03 0334) BP: (81-131)/(48-80) 88/54 (06/03 0347) SpO2:  [94 %-100 %] 96 % (06/03 0334) Weight:  [91.6 kg] 91.6 kg (06/02 1038)  General: WDWN patient in NAD. Psych:  Appropriate mood and affect. Neuro:  A&O x 3, Moving all extremities, sensation intact to light touch HEENT:  EOMs intact Chest:  Even non-labored respirations Skin: Dressings C/D/I, no rashes or lesions Extremities: warm/dry, no visible edema, erythema or echymosis.  No lymphadenopathy. Pulses: Popliteus 2+ MSK:  ROM: TKE, MMT: able to perform quad set, (-) Homan's    LABS Recent Labs    03/24/21 1207 03/25/21 0058  HGB 9.1* 8.2*  WBC 12.1* 11.3*  PLT 337 312   Recent Labs    03/24/21 1207 03/25/21 0058  NA 140 141  K 3.1* 3.7  CL 115* 115*  CO2 21* 23  BUN 7 6  CREATININE 0.97 0.86  GLUCOSE 162* 109*   No results for input(s): LABPT, INR in the last 72 hours.   Assessment/Plan: 1 Day Post-Op Procedure(s) (LRB): TRANSMETATARSAL AMPUTATION (Left) AMPUTATION TOE (Right)  WBAT R LE in CAM boot with walker WBAT L LE in post-op shoe. Up with therapy Follow cultures.  Patient may require IV ABX upon D/C Disp:  Likely home D/C script for oxycodone sent to patient's outpatient pharmacy.Prior to prescribing the oxycodone I reviewed the patient's narcotic medical record in the PMP Aware database. Plan for 2 week outpatient post-op visit.  Alfredo Martinez PA-C [facility] Office:  [phone]

## 2021-03-25 NOTE — Progress Notes (Signed)
PT Cancellation Note  Patient Details Name: [name] MRN: [id number] DOB: 09-06-1966   Cancelled Treatment:    Reason Eval/Treat Not Completed: Patient not medically ready. BP has remained very low this pm. Will continue to hold and attempt Evaluation tomorrow as appropriate.    Roxi Hlavaty 03/25/2021, 2:20 PM

## 2021-03-25 NOTE — Plan of Care (Signed)

## 2021-03-25 NOTE — Discharge Instructions (Signed)
Toni Arthurs, MD [facility]  Please read the following information regarding your care after surgery.  Medications  You only need a prescription for the narcotic pain medicine (ex. oxycodone, Percocet, Norco).  All of the other medicines listed below are available over the counter. X Aleve 2 pills twice a day for the first 3 days after surgery. X acetominophen (Tylenol) 650 mg every 4-6 hours as you need for minor to moderate pain X oxycodone as prescribed for severe pain  Narcotic pain medicine (ex. oxycodone, Percocet, Vicodin) will cause constipation.  To prevent this problem, take the following medicines while you are taking any pain medicine. X docusate sodium (Colace) 100 mg twice a day X senna (Senokot) 2 tablets twice a day  Weight Bearing X Bear weight only on your operated foot in the post-op shoe on the Left and CAM boot on the Right with walker.   Cast / Splint / Dressing X Keep your splint, cast or dressing clean and dry.  Don't put anything (coat hanger, pencil, etc) down inside of it.  If it gets damp, use a hair dryer on the cool setting to dry it.  If it gets soaked, call the office to schedule an appointment for a cast change.   After your dressing, cast or splint is removed; you may shower, but do not soak or scrub the wound.  Allow the water to run over it, and then gently pat it dry.  Swelling It is normal for you to have swelling where you had surgery.  To reduce swelling and pain, keep your toes above your nose for at least 3 days after surgery.  It may be necessary to keep your foot or leg elevated for several weeks.  If it hurts, it should be elevated.  Follow Up Call my office at [phone] when you are discharged from the hospital or surgery center to schedule an appointment to be seen two weeks after surgery.  Call my office at [phone] if you develop a fever >101.5 F, nausea, vomiting, bleeding from the surgical site or severe pain.

## 2021-03-26 LAB — CBC
HCT: 27.4 % — ABNORMAL LOW (ref 36.0–46.0)
Hemoglobin: 8.1 g/dL — ABNORMAL LOW (ref 12.0–15.0)
MCH: 26.7 pg (ref 26.0–34.0)
MCHC: 29.6 g/dL — ABNORMAL LOW (ref 30.0–36.0)
MCV: 90.4 fL (ref 80.0–100.0)
Platelets: 321 10*3/uL (ref 150–400)
RBC: 3.03 MIL/uL — ABNORMAL LOW (ref 3.87–5.11)
RDW: 17.2 % — ABNORMAL HIGH (ref 11.5–15.5)
WBC: 12.2 10*3/uL — ABNORMAL HIGH (ref 4.0–10.5)
nRBC: 0 % (ref 0.0–0.2)

## 2021-03-26 LAB — COMPREHENSIVE METABOLIC PANEL
ALT: 14 U/L (ref 0–44)
AST: 18 U/L (ref 15–41)
Albumin: 2.6 g/dL — ABNORMAL LOW (ref 3.5–5.0)
Alkaline Phosphatase: 61 U/L (ref 38–126)
Anion gap: 5 (ref 5–15)
BUN: 5 mg/dL — ABNORMAL LOW (ref 6–20)
CO2: 24 mmol/L (ref 22–32)
Calcium: 7.6 mg/dL — ABNORMAL LOW (ref 8.9–10.3)
Chloride: 111 mmol/L (ref 98–111)
Creatinine, Ser: 0.92 mg/dL (ref 0.44–1.00)
GFR, Estimated: >60 mL/min (ref >60)
Glucose, Bld: 91 mg/dL (ref 70–99)
Potassium: 4.1 mmol/L (ref 3.5–5.1)
Sodium: 140 mmol/L (ref 135–145)
Total Bilirubin: 0.5 mg/dL (ref 0.3–1.2)
Total Protein: 4.8 g/dL — ABNORMAL LOW (ref 6.5–8.1)

## 2021-03-26 LAB — PHOSPHORUS: Phosphorus: 2.5 mg/dL (ref 2.5–4.6)

## 2021-03-26 LAB — GLUCOSE, CAPILLARY
Glucose-Capillary: 109 mg/dL — ABNORMAL HIGH (ref 70–99)
Glucose-Capillary: 152 mg/dL — ABNORMAL HIGH (ref 70–99)
Glucose-Capillary: 176 mg/dL — ABNORMAL HIGH (ref 70–99)
Glucose-Capillary: 102 mg/dL — ABNORMAL HIGH (ref 70–99)
Glucose-Capillary: 160 mg/dL — ABNORMAL HIGH (ref 70–99)

## 2021-03-26 LAB — MAGNESIUM: Magnesium: 2.1 mg/dL (ref 1.7–2.4)

## 2021-03-26 LAB — CALCIUM, IONIZED: Calcium, Ionized, Serum: 4.8 mg/dL (ref 4.5–5.6)

## 2021-03-26 MED ORDER — INSULIN ASPART 100 UNIT/ML IJ SOLN
0.0000 [IU] | Freq: Three times a day (TID) | INTRAMUSCULAR | Status: DC
  Administered 2021-03-27 – 2021-03-28 (×2): 1 [IU] via SUBCUTANEOUS

## 2021-03-26 NOTE — Plan of Care (Signed)

## 2021-03-26 NOTE — Progress Notes (Signed)
PROGRESS NOTE  Stacey CromerLisa Pollett ZOX:[id number] DOB: March 26, 1966 DOA: 03/24/2021 PCP: Eartha InchBadger, Michael C, MD   LOS: 2 days   Brief narrative:  [name] is a 55 y.o. 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  She had been trying to get her early scheduled Rituxan infusion for treatment of her Wegener's granulomatosis 2 days ago when she was noted to be lethargic and acutely altered.  In the ED initial ABG showed pH of 7.2 , PCO2 49.9, and PCO2 76.8.  Symptoms were thought to be multifactorial in nature secondary to multiple high risk medications in addition to patient not using CPAP at night.  She was started on CPAP nightly with improvement in patient's mental status.  Urinalysis was concerning for infection and patient had reported some concerns for dysuria.  She had been initially started on Rocephin IV.  During her initial evaluation she was also noted to have a left lateral foot stump and right second toe wound for which MRI was obtained.  MRI revealed osteomyelitis of the right second toe distal phalanx with nonspecific subcutaneous edema, and osteomyelitis of the left fifth metatarsal remaining base with adjacent ulcer with surrounding nonspecific edema.  Metronidazole was added to her medication regimen.  Dr. Victorino DikeHewitt of orthopedics was consulted and [facility] and [facility].  Patient underwent amputation and is currently on broad-spectrum antibiotics awaiting for blood cultures.  Assessment/Plan:  Principal Problem:   Osteomyelitis (HCC) Active Problems:   OSA (obstructive sleep apnea)   CKD (chronic kidney  disease) stage 3, GFR 30-59 ml/min (HCC)   Normocytic anemia   Obesity (BMI 30-39.9)   ANCA-associated vasculitis (HCC)   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)   Chronic pain  Acute on chronic osteomyelitis:  Patient was found to have osteomyelitis of the left fifth  metatarsal head as well as right second toe at the outside facility.  Currently on Rocephin and metronidazole.  Status post transmetatarsal amputation.  Continue continue empiric antibiotics at this time.  Orthopedics on board.  Follow-up bone culture.  Urinary tract infection: Present on admission at outside facility.  On 5/31 patient had abnormal urinalysis.  Continue IV Rocephin, started on 5/31.  Leukocytosis: Mild secondary to osteomyelitis.  At this time WBC of 12 point  Diabetes mellitus type 2:  Latest hemoglobin A1c of 5.9.  Metformin on hold.  Continue sliding scale insulin, Accu-Cheks, diabetic diet.  Latest POC glucose of 109  Normocytic anemia: No bleeding.  Likely anemia of chronic disease as well.  Closely monitor.  Latest hemoglobin of 8.1.  No need for blood transfusion.  Wegener's granulomatosis, ANCA associated vasculitis: Patient is on Rituxan infusions, but was unable to get her last infusion scheduled on 5/31 due to confusion.  Home regimen includes prednisone 7.5 mg daily, methotrexate, Rituxan infusions.  She is followed in the outpatient setting by  [facility].  Will continue with prednisone and folic acid.  Hypokalemia:  Improved with replacement.  Latest potassium of 4.1  Chronic pain On Nucynta, gabapentin and Cymbalta.  We will continue with that  OSA:   Noncompliance.   Continue CPAP while in the hospital..  Will need  sleep study as outpatient.  GERD On Pepcid  Mild diarrhea epigastric discomfort.  Likely antibiotic associated. Protonix, probiotic.  Mild diarrhea today.  Discussed about Imodium he does not want to take yet.   Dizziness,  hypotension postural.  Likely volume depletion.  Continue with IV fluid hydration.  Received normal saline bolus yesterday blood pressure is slightly improved today.  We will get orthostatic blood pressure while ambulating.  Obesity: BMI 32.59 kg/m.  Would benefit from lifestyle modification and weight loss on a ongoing basis.  DVT prophylaxis: SCD's Start: 03/24/21 2024 SCDs Start: 03/24/21 1813    Code Status: Full code  Family Communication: None  Status is: Inpatient  Remains inpatient appropriate because:IV treatments appropriate due to intensity of illness or inability to take PO and Inpatient level of care appropriate due to severity of illness   Dispo: The patient is from: Home              Anticipated d/c is to: Home, await PT evaluation              Patient currently is not medically stable to d/c.   Difficult to place patient No   Consultants:  Orthopedics  Procedures:  Right Achilles tendon percutaneous lengthening, right foot trans-metatarsal amputation, left fifth metatarsal ray amputation on 03/24/2021   Anti-infectives:  Marland Kitchen Vancomycin, Rocephin, Metro  Anti-infectives (From admission, onward)   Start     Dose/Rate Route Frequency Ordered Stop   03/25/21 1200  vancomycin (VANCOREADY) IVPB 1250 mg/250 mL  Status:  Discontinued        1,250 mg 166.7 mL/hr over 90 Minutes Intravenous Every 24 hours 03/24/21 1216 03/24/21 1249   03/25/21 0600  vancomycin (VANCOCIN) IVPB 1000 mg/200 mL premix        1,000 mg 200 mL/hr over 60 Minutes Intravenous On call to O.R. 03/24/21 1459 03/24/21 1608   03/24/21 1625  vancomycin (VANCOCIN) powder  Status:  Discontinued          As needed 03/24/21 1626 03/24/21 1708   03/24/21 1215  cefTRIAXone (ROCEPHIN) 2 g in sodium chloride 0.9 % 100 mL IVPB        2 g 200 mL/hr over 30 Minutes Intravenous Every 24 hours 03/24/21 1126     03/24/21 1215  metroNIDAZOLE (FLAGYL) IVPB 500 mg        500 mg 100 mL/hr over 60 Minutes  Intravenous Every 8 hours 03/24/21 1126     03/24/21 1215  vancomycin (VANCOREADY) IVPB 1750 mg/350 mL  Status:  Discontinued        1,750 mg 175 mL/hr over 120 Minutes Intravenous  Once 03/24/21 1128 03/24/21 1249     Subjective: Today, patient was seen and examined at bedside.  Patient complains of mild epigastric discomfort.  No nausea or vomiting.  Has had loose stools.  Objective: Vitals:   03/26/21 0400 03/26/21 0751  BP: (!) 88/55 101/65  Pulse: (!) 58 66  Resp: 17 18  Temp: 98.8 F (37.1 C) 98.4 F (36.9 C)  SpO2: 94% 94%    Intake/Output Summary (Last 24 hours) at 03/26/2021 0752 Last data filed at 03/26/2021 0700 Gross per 24 hour  Intake 3186.42 ml  Output 2650 ml  Net 536.42 ml   Filed Weights   03/24/21 1038  Weight: 91.6 kg   Body mass index is 32.59 kg/m.   Physical Exam: GENERAL: Patient is alert awake and oriented. Not in obvious distress.,  Mildly obese HENT: No scleral pallor or icterus.  Pupils equally reactive to light. Oral mucosa is moist NECK: is supple, no gross swelling noted. CHEST: Clear to auscultation. No crackles or wheezes.  Diminished breath sounds bilaterally. CVS: S1 and S2 heard, no murmur. Regular rate and rhythm.  ABDOMEN: Soft, non-tender, bowel sounds are present. EXTREMITIES: No edema.  Bilateral foot wrapped with dressing.. CNS: Cranial nerves are intact. No focal motor deficits. SKIN: warm and dry, bilateral foot on wraps.  Data Review: I have personally reviewed the following laboratory data and studies,  CBC: Recent Labs  Lab 03/24/21 1207 03/25/21 0058 03/26/21 0254  WBC 12.1* 11.3* 12.2*  NEUTROABS 8.7*  --   --   HGB 9.1* 8.2* 8.1*  HCT 30.1* 27.1* 27.4*  MCV 88.3 88.6 90.4  PLT 337 312 321   Basic Metabolic Panel: Recent Labs  Lab 03/24/21 1207 03/25/21 0058 03/26/21 0254  NA 140 141 140  K 3.1* 3.7 4.1  CL 115* 115* 111  CO2 21* 23 24  GLUCOSE 162* 109* 91  BUN 7 6 5*  CREATININE 0.97 0.86 0.92   CALCIUM 7.9* 8.1* 7.6*  MG  --   --  2.1  PHOS  --   --  2.5   Liver Function Tests: Recent Labs  Lab 03/24/21 1207 03/26/21 0254  AST 22 18  ALT 16 14  ALKPHOS 60 61  BILITOT 0.7 0.5  PROT 4.9* 4.8*  ALBUMIN 2.8* 2.6*   No results for input(s): LIPASE, AMYLASE in the last 168 hours. No results for input(s): AMMONIA in the last 168 hours. Cardiac Enzymes: No results for input(s): CKTOTAL, CKMB, CKMBINDEX, TROPONINI in the last 168 hours. BNP (last 3 results) No results for input(s): BNP in the last 8760 hours.  ProBNP (last 3 results) No results for input(s): PROBNP in the last 8760 hours.  CBG: Recent Labs  Lab 03/25/21 0609 03/25/21 1145 03/25/21 1705 03/25/21 2158 03/26/21 0603  GLUCAP 91 95 149* 129* 109*   Recent Results (from the past 240 hour(s))  SARS CORONAVIRUS 2 (TAT 6-24 HRS) Nasopharyngeal Nasopharyngeal Swab     Status: None   Collection Time: 03/24/21 12:37 PM   Specimen: Nasopharyngeal Swab  Result Value Ref Range Status   SARS Coronavirus 2 NEGATIVE NEGATIVE Final    Comment: (NOTE) SARS-CoV-2 target nucleic acids are NOT DETECTED.  The SARS-CoV-2 RNA is generally detectable in upper and lower respiratory specimens during the acute phase of infection. Negative results do not preclude SARS-CoV-2 infection, do not rule out co-infections with other pathogens, and should not be used as the sole basis for treatment or other patient management decisions. Negative results must be combined with clinical observations, patient history, and epidemiological information. The expected result is Negative.  Fact Sheet for Patients: HairSlick.no  Fact Sheet for Healthcare Providers: quierodirigir.com  This test is not yet approved or cleared by the [place] FDA and  has been authorized for detection and/or diagnosis of SARS-CoV-2 by FDA under an Emergency Use Authorization (EUA). This EUA will  remain  in effect (meaning this test can be used) for the duration of the COVID-19 declaration under Se ction 564(b)(1) of the Act, 21 U.S.C. section 360bbb-3(b)(1), unless the authorization is terminated or revoked sooner.  Performed at [facility], [address] [place], [place] [address]   Surgical pcr screen     Status: None   Collection Time: 03/24/21  4:23 PM   Specimen: Nasal Mucosa; Nasal Swab  Result Value Ref Range Status   MRSA,  PCR NEGATIVE NEGATIVE Final   Staphylococcus aureus NEGATIVE NEGATIVE Final    Comment: (NOTE) The Xpert SA Assay (FDA approved for NASAL specimens in patients 15 years of age and older), is one component of a comprehensive surveillance program. It is not intended to diagnose infection nor to guide or monitor treatment. Performed at [facility], [address] [place], [place] [address]   Aerobic/Anaerobic Culture w Gram Stain (surgical/deep wound)     Status: None (Preliminary result)   Collection Time: 03/24/21  4:43 PM   Specimen: Soft Tissue, Other  Result Value Ref Range Status   Specimen Description TISSUE LEFT FOOT  Final   Special Requests DEEP CULTURE SWAB  Final   Gram Stain   Final    NO WBC SEEN NO ORGANISMS SEEN Performed at [facility], [address], [place], [place] [address]    Culture PENDING  Incomplete   Report Status PENDING  Incomplete     Studies: No results found.    Joycelyn Das, MD  [facility] 03/26/2021  If 7PM-7AM, please contact night-coverage

## 2021-03-26 NOTE — Evaluation (Signed)
Occupational Therapy Evaluation Patient Details Name: [name] MRN: [id number] DOB: 1965-12-12 Today's Date: 03/26/2021    History of Present Illness 55 y.o. 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   Clinical Impression   Patient admitted for the above diagnosis and procedure.  PTA she lives with her spouse, who works, but can provide assist as needed after work.  She will have a few friends alternating staying with her during the day to assist with meals and help where needed.  She self reports independence with ADL and basic home management.  She walked with a RW in the home, and used the scooter for longer distances.  Barriers are listed below.  Currently, she is needing up to min Guard for basic mobility and ADL completion at sit/stand level.  OT will follow in the acute setting to maximize functional status prior to returning home.      Follow Up Recommendations  No OT follow up    Equipment Recommendations  None recommended by OT    Recommendations for Other Services       Precautions / Restrictions Precautions Precautions: Fall Restrictions Weight Bearing Restrictions: Yes RLE Weight Bearing: Weight bearing as tolerated (cam walker) LLE Weight Bearing: Weight bearing as tolerated (post op shoe) Other Position/Activity Restrictions: had patient lead with RW, L LE then R LE.  Patient states L post op shoe feels too big, and less stable.      Mobility Bed Mobility Overal bed mobility: Needs Assistance Bed Mobility: Supine to Sit;Sit to Supine     Supine to sit: Supervision;HOB elevated Sit to supine: Supervision;HOB elevated     Patient  Response: Cooperative  Transfers Overall transfer level: Needs assistance   Transfers: Sit to/from [employer] Transfers Sit to Stand: Min guard Stand pivot transfers: Min guard            Balance Overall balance assessment: Needs assistance Sitting-balance support: Feet supported;Single extremity supported Sitting balance-Leahy Scale: Good     Standing balance support: Bilateral upper extremity supported Standing balance-Leahy Scale: Poor Standing balance comment: needs RW for support                           ADL either performed or assessed with clinical judgement   ADL Overall ADL's : Needs assistance/impaired     Grooming: Wash/dry hands;Supervision/safety;Standing       Lower Body Bathing: Sit to/from stand;Min guard       Lower Body Dressing: Minimal assistance;Sitting/lateral leans   Toilet Transfer: RW;Min guard   Toileting- Clothing Manipulation and Hygiene: Sitting/lateral lean;Supervision/safety       Functional mobility during ADLs: Min guard;Rolling walker       Vision Baseline Vision/History: Wears glasses Wears Glasses: Reading only Patient Visual Report: No change from baseline       Perception     Praxis      Pertinent Vitals/Pain Pain Assessment: No/denies pain     Hand Dominance Right   Extremity/Trunk Assessment Upper Extremity Assessment Upper Extremity Assessment: Overall WFL for tasks assessed   Lower Extremity Assessment Lower Extremity Assessment: Defer to PT evaluation   Cervical / Trunk Assessment Cervical / Trunk Assessment: Normal  Communication Communication Communication: No difficulties   Cognition Arousal/Alertness: Awake/alert Behavior During Therapy: WFL for tasks assessed/performed Overall Cognitive Status: Within Functional Limits for tasks assessed                                                      Home Living Family/patient expects to be discharged  to:: Private residence Living Arrangements: Spouse/significant other Available Help at Discharge: Family Type of Home: House Home Access: Stairs to enter Secretary/administrator of Steps: 2 Entrance Stairs-Rails: Right Home Layout: One level     Bathroom Shower/Tub: Chief Strategy Officer: Handicapped height Bathroom Accessibility: Yes How Accessible: Accessible via walker Home Equipment: Tub bench;Grab bars - tub/shower;Wheelchair - Technical sales engineer - 2 wheels;Hand held shower head;Shower seat   Additional Comments: adjustable bed      Prior Functioning/Environment Level of Independence: Independent with assistive device(s)        Comments: ambulate short household distances using RW, uses electric scooter for longer distances, needed no assist with bathig and dressing.        OT Problem List: Impaired balance (sitting and/or standing);Decreased activity tolerance      OT Treatment/Interventions: Self-care/ADL training;DME and/or AE instruction;Balance training;Therapeutic activities    OT Goals(Current goals can be found in the care plan section) Acute Rehab OT Goals Patient Stated Goal: Return home and recover OT Goal Formulation: With patient Time For Goal Achievement: 04/09/21 Potential to Achieve Goals: Good ADL Goals Pt Will Perform Grooming: with modified independence;sitting;standing Pt Will Perform Lower Body Bathing: with modified independence;sit to/from stand Pt Will Perform Lower Body Dressing: with modified independence;sit to/from stand Pt Will Transfer to Toilet: with modified independence;ambulating;regular height toilet Pt Will Perform Toileting - Clothing Manipulation and hygiene: with modified independence;sit to/from stand  OT Frequency: Min 2X/week   Barriers to D/C:    none noted       Co-evaluation              AM-PAC OT "6 Clicks" Daily Activity     Outcome Measure Help from another person eating meals?:  None Help from another person taking care of personal grooming?: None Help from another person toileting, which includes using toliet, bedpan, or urinal?: A Little Help from another person bathing (including washing, rinsing, drying)?: A Little Help from another person to put on and taking off regular upper body clothing?: A Little Help from another person to put on and taking off regular lower body clothing?: A Little 6 Click Score: 20   End of Session Equipment Utilized During Treatment: Gait belt;Rolling walker Nurse Communication: Mobility status  Activity Tolerance: Patient tolerated treatment well Patient left: in bed;with call bell/phone within reach  OT Visit Diagnosis: Unsteadiness on feet (R26.81)                Time: 0737-1062 OT Time Calculation (min): 17 min Charges:  OT General Charges $OT Visit: 1 Visit OT Evaluation $OT Eval Moderate Complexity: 1 Mod  03/26/2021  [name], [name]  Acute Rehabilitation Services  Office:  [phone]   [name] 03/26/2021, 2:14 PM

## 2021-03-26 NOTE — Progress Notes (Signed)
Subjective: 2 Days Post-Op Procedure(s) (LRB): TRANSMETATARSAL AMPUTATION (Left) AMPUTATION TOE (Right) Patient reports pain as moderate.    Objective: Vital signs in last 24 hours: Temp:  [97.8 F (36.6 C)-98.8 F (37.1 C)] 98.4 F (36.9 C) (06/04 0751) Pulse Rate:  [57-73] 66 (06/04 0751) Resp:  [12-19] 18 (06/04 0751) BP: (78-111)/(44-73) 101/65 (06/04 0751) SpO2:  [92 %-98 %] 94 % (06/04 0751)  Intake/Output from previous day: 06/03 0701 - 06/04 0700 In: 3186.4 [P.O.:948; I.V.:2038.4; IV Piggyback:200] Out: 2650 [Urine:2650] Intake/Output this shift: No intake/output data recorded.  Recent Labs    03/24/21 1207 03/25/21 0058 03/26/21 0254  HGB 9.1* 8.2* 8.1*   Recent Labs    03/25/21 0058 03/26/21 0254  WBC 11.3* 12.2*  RBC 3.06* 3.03*  HCT 27.1* 27.4*  PLT 312 321   Recent Labs    03/25/21 0058 03/26/21 0254  NA 141 140  K 3.7 4.1  CL 115* 111  CO2 23 24  BUN 6 5*  CREATININE 0.86 0.92  GLUCOSE 109* 91  CALCIUM 8.1* 7.6*   No results for input(s): LABPT, INR in the last 72 hours.  Bandages intact bilateral feet; No drainage   Assessment/Plan: 2 Days Post-Op Procedure(s) (LRB): TRANSMETATARSAL AMPUTATION (Left) AMPUTATION TOE (Right) Up with therapy  Should be able to participate with PT today    [name] 03/26/2021, 8:37 AM

## 2021-03-26 NOTE — Evaluation (Signed)
Physical Therapy Evaluation Patient Details Name: [name] MRN: [id number] DOB: 03/09/1966 Today's Date: 03/26/2021   History of Present Illness  The pt is a 55 yo female presenting 6/2 due to bilateral foot infections. 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    Clinical Impression  Pt in bed upon arrival of PT, agreeable to evaluation at this time. Prior to admission the pt was independent with household distances without AD (pt does report some furniture walking) or use of motor scooter for community mobility. The pt lives in an accessible home with her husband who can provide assist as needed upon d/c. The pt now presents with minor limitations in functional mobility, stability, and activity tolerance due to above dx and pain, and will continue to benefit from skilled PT to address these deficits. The pt was able to complete all bed mobility with supervision for safety, but no need for assist at this time. She was then able to complete multiple sit-stand transfers and stand-pivot transfers with use of RW to steady, but again without physical assist. The pt reports she is limited from further mobility by significant increase in bilateral feet (surgical sites) with wt bearing, but demos good static standing stability. The pt will continue to benefit from skilled PT to address progression of gait, dynamic stability, and establish HEP prior to anticipated d/c home.      Follow Up Recommendations No PT follow up;Supervision for mobility/OOB    Equipment Recommendations  None recommended by PT    Recommendations for Other Services       Precautions / Restrictions Precautions Precautions: Fall Required Braces or Orthoses: Other Brace Other Brace: R cam walker boot, L  post-op shoe Restrictions Weight Bearing Restrictions: Yes RLE Weight Bearing: Weight bearing as tolerated LLE Weight Bearing: Weight bearing as tolerated Other Position/Activity Restrictions: had patient lead with RW, L LE then R LE.  Patient states L post op shoe feels too big, and less stable.      Mobility  Bed Mobility Overal bed mobility: Needs Assistance Bed Mobility: Supine to Sit;Sit to Supine     Supine to sit: Supervision Sit to supine: Supervision   General bed mobility comments: supervision from flat bed, pt able to move in bed and adjust without assist    Transfers Overall transfer level: Needs assistance Equipment used: Rolling walker (2 wheeled) Transfers: Sit to/from [employer] Sit to Stand: Min guard Stand pivot transfers: Min guard       General transfer comment: minG with use of RW. completed x2 stand-pivot in session. pt limited by pain  Ambulation/Gait Ambulation/Gait assistance: Min guard Gait Distance (Feet): 4 Feet Assistive device: Rolling walker (2 wheeled) Gait Pattern/deviations: Decreased stride length;Step-to pattern Gait velocity: decreased Gait velocity interpretation: <1.31 ft/sec, indicative of household ambulator General Gait Details: small steps with increased relaince on UE support due to pain. no LOB or evidence of instability      Balance Overall balance assessment: Needs assistance;History of Falls (pt reports 3-4 falls in last 6 months, mostly with mobility at night) Sitting-balance support: Feet supported;Single extremity supported Sitting balance-Leahy Scale: Good     Standing balance support: Bilateral upper extremity supported Standing balance-Leahy Scale: Poor Standing balance comment: needs RW for support  Pertinent Vitals/Pain Pain Assessment: 0-10 Pain Score: 5  Pain Location: bilateral feet, incision Pain Descriptors / Indicators: Discomfort;Operative  site guarding;Sore Pain Intervention(s): Limited activity within patient's tolerance;Monitored during session;Repositioned    Home Living Family/patient expects to be discharged to:: Private residence Living Arrangements: Spouse/significant other Available Help at Discharge: Family Type of Home: House Home Access: Stairs to enter Entrance Stairs-Rails: Right Entrance Stairs-Number of Steps: 2 Home Layout: One level Home Equipment: Tub bench;Grab bars - tub/shower;Wheelchair - Technical sales engineer - 2 wheels;Hand held shower head;Shower seat Additional Comments: adjustable bed    Prior Function Level of Independence: Independent with assistive device(s)         Comments: ambulate short household distances using RW, uses electric scooter for longer distances, needed no assist with bathig and dressing.     Hand Dominance   Dominant Hand: Right    Extremity/Trunk Assessment   Upper Extremity Assessment Upper Extremity Assessment: Defer to OT evaluation;Overall WFL for tasks assessed    Lower Extremity Assessment Lower Extremity Assessment: Overall WFL for tasks assessed;RLE deficits/detail;LLE deficits/detail RLE Deficits / Details: grossly functional strength, WBAT in cam walker boot (limited by pain) RLE: Unable to fully assess due to pain RLE Sensation: WNL RLE Coordination: WNL LLE Deficits / Details: grossly functional strength, WBAT in post-op shoe. limited by pain LLE: Unable to fully assess due to pain LLE Sensation: WNL LLE Coordination: WNL    Cervical / Trunk Assessment Cervical / Trunk Assessment: Normal  Communication   Communication: No difficulties  Cognition Arousal/Alertness: Awake/alert Behavior During Therapy: WFL for tasks assessed/performed Overall Cognitive Status: Within Functional Limits for tasks assessed                                 General Comments: pt calm and pleasant. able to follow all instructions and demo  good understanding of safety      General Comments General comments (skin integrity, edema, etc.): bilateral feet in bandages    Exercises     Assessment/Plan    PT Assessment Patient needs continued PT services  PT Problem List Decreased activity tolerance;Decreased balance;Decreased mobility;Decreased coordination;Pain       PT Treatment Interventions DME instruction;Gait training;Functional mobility training;Therapeutic activities;Therapeutic exercise;Balance training;Patient/family education    PT Goals (Current goals can be found in the Care Plan section)  Acute Rehab PT Goals Patient Stated Goal: Return home and recover PT Goal Formulation: With patient Time For Goal Achievement: 04/09/21 Potential to Achieve Goals: Good    Frequency Min 3X/week    AM-PAC PT "6 Clicks" Mobility  Outcome Measure Help needed turning from your back to your side while in a flat bed without using bedrails?: A Little Help needed moving from lying on your back to sitting on the side of a flat bed without using bedrails?: A Little Help needed moving to and from a bed to a chair (including a wheelchair)?: A Little Help needed standing up from a chair using your arms (e.g., wheelchair or bedside chair)?: A Little Help needed to walk in hospital room?: A Little Help needed climbing 3-5 steps with a railing? : A Little 6 Click Score: 18    End of Session Equipment Utilized During Treatment:  (R cam walker boot, L post-op shoe) Activity Tolerance: Patient tolerated treatment well;Patient limited by pain Patient left: in bed;with call bell/phone within reach Nurse Communication: Mobility status PT Visit Diagnosis: Other abnormalities of gait and mobility (R26.89);Pain Pain -  Right/Left: Right (and Left) Pain - part of body: Ankle and joints of foot    Time: 1401-1426 PT Time Calculation (min) (ACUTE ONLY): 25 min   Charges:   PT Evaluation $PT Eval Low Complexity: 1 Low PT  Treatments $Therapeutic Activity: 8-22 mins        Rolm Baptise, PT, DPT   Acute Rehabilitation Department Pager #: [phone]  Gaetana Michaelis 03/26/2021, 4:13 PM

## 2021-03-27 LAB — GLUCOSE, CAPILLARY
Glucose-Capillary: 87 mg/dL (ref 70–99)
Glucose-Capillary: 127 mg/dL — ABNORMAL HIGH (ref 70–99)
Glucose-Capillary: 172 mg/dL — ABNORMAL HIGH (ref 70–99)
Glucose-Capillary: 159 mg/dL — ABNORMAL HIGH (ref 70–99)

## 2021-03-27 LAB — BASIC METABOLIC PANEL
Anion gap: 3 — ABNORMAL LOW (ref 5–15)
BUN: 7 mg/dL (ref 6–20)
CO2: 27 mmol/L (ref 22–32)
Calcium: 8 mg/dL — ABNORMAL LOW (ref 8.9–10.3)
Chloride: 111 mmol/L (ref 98–111)
Creatinine, Ser: 0.85 mg/dL (ref 0.44–1.00)
GFR, Estimated: >60 mL/min (ref >60)
Glucose, Bld: 100 mg/dL — ABNORMAL HIGH (ref 70–99)
Potassium: 3.8 mmol/L (ref 3.5–5.1)
Sodium: 141 mmol/L (ref 135–145)

## 2021-03-27 LAB — CBC
HCT: 28.7 % — ABNORMAL LOW (ref 36.0–46.0)
Hemoglobin: 8.5 g/dL — ABNORMAL LOW (ref 12.0–15.0)
MCH: 26.4 pg (ref 26.0–34.0)
MCHC: 29.6 g/dL — ABNORMAL LOW (ref 30.0–36.0)
MCV: 89.1 fL (ref 80.0–100.0)
Platelets: 314 10*3/uL (ref 150–400)
RBC: 3.22 MIL/uL — ABNORMAL LOW (ref 3.87–5.11)
RDW: 17 % — ABNORMAL HIGH (ref 11.5–15.5)
WBC: 10.1 10*3/uL (ref 4.0–10.5)
nRBC: 0 % (ref 0.0–0.2)

## 2021-03-27 NOTE — Plan of Care (Signed)
  Problem: Clinical Measurements: Goal: Respiratory complications will improve Outcome: Progressing   Problem: Nutrition: Goal: Adequate nutrition will be maintained Outcome: Progressing   Problem: Pain Managment: Goal: General experience of comfort will improve Outcome: Progressing   Problem: Safety: Goal: Ability to remain free from injury will improve Outcome: Progressing   Problem: Skin Integrity: Goal: Risk for impaired skin integrity will decrease Outcome: Progressing   

## 2021-03-27 NOTE — Progress Notes (Signed)
PROGRESS NOTE  Stacey CromerLisa Andreas [id number] DOB: November 06, 1965 DOA: 03/24/2021 PCP: Eartha InchBadger, Michael C, MD   LOS: 3 days   Brief narrative:  [name] is a 55 y.o. 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  She had been trying to get her early scheduled Rituxan infusion for treatment of her Wegener's granulomatosis 2 days ago when she was noted to be lethargic and acutely altered.  In the ED initial ABG showed pH of 7.2 , PCO2 49.9, and PCO2 76.8.  Symptoms were thought to be multifactorial in nature secondary to multiple high risk medications in addition to patient not using CPAP at night.  She was started on CPAP nightly with improvement in patient's mental status.  Urinalysis was concerning for infection and patient had reported some concerns for dysuria.  She had been initially started on Rocephin IV.  During her initial evaluation she was also noted to have a left lateral foot stump and right second toe wound for which MRI was obtained.  MRI revealed osteomyelitis of the right second toe distal phalanx with nonspecific subcutaneous edema, and osteomyelitis of the left fifth metatarsal remaining base with adjacent ulcer with surrounding nonspecific edema.  Metronidazole was added to her medication regimen.  Dr. Victorino DikeHewitt of orthopedics was consulted and [facility] was [facility] and [facility].  Patient underwent amputation and is currently on broad-spectrum antibiotics awaiting for blood cultures and orthopedic recommendations.  Assessment/Plan:  Principal Problem:   Osteomyelitis (HCC) Active Problems:   OSA (obstructive sleep  apnea)   CKD (chronic kidney disease) stage 3, GFR 30-59 ml/min (HCC)   Normocytic anemia   Obesity (BMI 30-39.9)   ANCA-associated vasculitis (HCC)   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)   Chronic pain  Acute on chronic osteomyelitis:  Patient was found to have osteomyelitis of the left fifth  metatarsal head as well as right second toe at the outside facility.  Currently on Rocephin and metronidazole.  Status post transmetatarsal amputation by orthopedics.  Continue continue empiric antibiotics at this time.  Orthopedics on board.  Follow-up bone culture no growth so far..  Urinary tract infection: Present on admission at outside facility.  On 5/31 patient had abnormal urinalysis.  Continue IV Rocephin, started on 5/31.  Leukocytosis: Mild secondary to osteomyelitis.  Leukocytosis has trended down.  Point  Diabetes mellitus type 2:  Latest hemoglobin A1c of 5.9.  Metformin on hold.  Continue sliding scale insulin, Accu-Cheks, diabetic diet.  Latest POC glucose of 87  Normocytic anemia: No bleeding.  Likely anemia of chronic disease as well.  Closely monitor.  Latest hemoglobin of 8.5.  No need for blood transfusion.  Wegener's granulomatosis, ANCA associated vasculitis: Patient is on Rituxan infusions, but was unable to get her last infusion scheduled on 5/31 due to confusion.  Home regimen includes prednisone 7.5 mg daily, methotrexate, Rituxan infusions.  She is followed in the outpatient setting by  [facility]ology [facility].  Will continue with prednisone and folic acid.  Hypokalemia:  Improved with replacement.  Latest potassium of 3.8  Chronic pain On Nucynta, gabapentin and Cymbalta.  We will continue with that  OSA:   Noncompliance.   Continue  CPAP while in the hospital..  Will need sleep study as outpatient.  GERD On Pepcid  Mild diarrhea, epigastric discomfort.  Likely antibiotic associated. Protonix, probiotic.  Still few episodes of  diarrhea..  Discussed about Imodium he does not want to take yet.  Dizziness, hypotension postural.  Likely volume depletion.  Continue with IV fluid hydration. We will get orthostatic blood pressure while ambulating.  Obesity: BMI 32.59 kg/m.  Would benefit from lifestyle modification and weight loss on a ongoing basis.  DVT prophylaxis: SCD's Start: 03/24/21 2024 SCDs Start: 03/24/21 1813    Code Status: Full code  Family Communication: None  Status is: Inpatient  Remains inpatient appropriate because:IV treatments appropriate due to intensity of illness or inability to take PO and Inpatient level of care appropriate due to severity of illness, IV antibiotics,   Dispo: The patient is from: Home              Anticipated d/c is to: Home, patient did not require any special physical therapy needs              Patient currently is not medically stable to d/c.   Difficult to place patient No   Consultants:  Orthopedics  Procedures:  Right Achilles tendon percutaneous lengthening, right foot trans-metatarsal amputation, left fifth metatarsal ray amputation on 03/24/2021   Anti-infectives:  Marland Kitchen Vancomycin, Rocephin, Metro IV  Anti-infectives (From admission, onward)   Start     Dose/Rate Route Frequency Ordered Stop   03/25/21 1200  vancomycin (VANCOREADY) IVPB 1250 mg/250 mL  Status:  Discontinued        1,250 mg 166.7 mL/hr over 90 Minutes Intravenous Every 24 hours 03/24/21 1216 03/24/21 1249   03/25/21 0600  vancomycin (VANCOCIN) IVPB 1000 mg/200 mL premix        1,000 mg 200 mL/hr over 60 Minutes Intravenous On call to O.R. 03/24/21 1459 03/24/21 1608   03/24/21 1625  vancomycin (VANCOCIN) powder  Status:  Discontinued          As needed 03/24/21 1626 03/24/21 1708   03/24/21 1215  cefTRIAXone (ROCEPHIN) 2 g in sodium chloride 0.9 % 100 mL IVPB        2 g 200 mL/hr over 30 Minutes Intravenous Every 24 hours 03/24/21 1126     03/24/21 1215  metroNIDAZOLE (FLAGYL) IVPB  500 mg        500 mg 100 mL/hr over 60 Minutes Intravenous Every 8 hours 03/24/21 1126     03/24/21 1215  vancomycin (VANCOREADY) IVPB 1750 mg/350 mL  Status:  Discontinued        1,750 mg 175 mL/hr over 120 Minutes Intravenous  Once 03/24/21 1128 03/24/21 1249     Subjective: Today, patient was seen and examined at bedside.  Complains of mild epigastric discomfort.  Has mild loose stools.  No abdominal pain fever or chills.  Objective: Vitals:   03/27/21 0420 03/27/21 0841  BP: 102/78 130/81  Pulse: 62   Resp: 19 17  Temp: 98 F (36.7 C) 98.2 F (36.8 C)  SpO2: 97% 97%    Intake/Output Summary (Last 24 hours) at 03/27/2021 1108 Last data filed at 03/27/2021 0242 Gross per 24 hour  Intake 240 ml  Output 2000 ml  Net -1760 ml   Filed Weights   03/24/21 1038  Weight: 91.6 kg   Body mass index is 32.59 kg/m.   Physical Exam: General: Obese,, not in obvious distress HENT:   No scleral pallor or icterus noted. Oral  mucosa is moist.  Chest:  Clear breath sounds.  Diminished breath sounds bilaterally. No crackles or wheezes.  CVS: S1 &S2 heard. No murmur.  Regular rate and rhythm. Abdomen: Soft, mild epigastric discomfort, nondistended.  Bowel sounds are heard.   Extremities: No cyanosis, clubbing or edema.  Peripheral pulses are palpable.  Bilateral foot on wraps Psych: Alert, awake and oriented, normal mood CNS:  No cranial nerve deficits.  Power equal in all extremities.   Skin: Warm and dry.  Bilateral foot on wraps.   Data Review: I have personally reviewed the following laboratory data and studies,  CBC: Recent Labs  Lab 03/24/21 1207 03/25/21 0058 03/26/21 0254 03/27/21 0133  WBC 12.1* 11.3* 12.2* 10.1  NEUTROABS 8.7*  --   --   --   HGB 9.1* 8.2* 8.1* 8.5*  HCT 30.1* 27.1* 27.4* 28.7*  MCV 88.3 88.6 90.4 89.1  PLT 337 312 321 314   Basic Metabolic Panel: Recent Labs  Lab 03/24/21 1207 03/25/21 0058 03/26/21 0254 03/27/21 0133  NA 140 141 140 141   K 3.1* 3.7 4.1 3.8  CL 115* 115* 111 111  CO2 21* 23 24 27   GLUCOSE 162* 109* 91 100*  BUN 7 6 5* 7  CREATININE 0.97 0.86 0.92 0.85  CALCIUM 7.9* 8.1* 7.6* 8.0*  MG  --   --  2.1  --   PHOS  --   --  2.5  --    Liver Function Tests: Recent Labs  Lab 03/24/21 1207 03/26/21 0254  AST 22 18  ALT 16 14  ALKPHOS 60 61  BILITOT 0.7 0.5  PROT 4.9* 4.8*  ALBUMIN 2.8* 2.6*   No results for input(s): LIPASE, AMYLASE in the last 168 hours. No results for input(s): AMMONIA in the last 168 hours. Cardiac Enzymes: No results for input(s): CKTOTAL, CKMB, CKMBINDEX, TROPONINI in the last 168 hours. BNP (last 3 results) No results for input(s): BNP in the last 8760 hours.  ProBNP (last 3 results) No results for input(s): PROBNP in the last 8760 hours.  CBG: Recent Labs  Lab 03/26/21 1212 03/26/21 1637 03/26/21 1856 03/26/21 2119 03/27/21 0635  GLUCAP 152* 176* 102* 160* 87   Recent Results (from the past 240 hour(s))  SARS CORONAVIRUS 2 (TAT 6-24 HRS) Nasopharyngeal Nasopharyngeal Swab     Status: None   Collection Time: 03/24/21 12:37 PM   Specimen: Nasopharyngeal Swab  Result Value Ref Range Status   SARS Coronavirus 2 NEGATIVE NEGATIVE Final    Comment: (NOTE) SARS-CoV-2 target nucleic acids are NOT DETECTED.  The SARS-CoV-2 RNA is generally detectable in upper and lower respiratory specimens during the acute phase of infection. Negative results do not preclude SARS-CoV-2 infection, do not rule out co-infections with other pathogens, and should not be used as the sole basis for treatment or other patient management decisions. Negative results must be combined with clinical observations, patient history, and epidemiological information. The expected result is Negative.  Fact Sheet for Patients: 05/24/21  Fact Sheet for Healthcare Providers: HairSlick.no  This test is not yet approved or cleared by the  [employer]ir[employer]ir.com FDA and  has been authorized for detection and/or diagnosis of SARS-CoV-2 by FDA under an Emergency Use Authorization (EUA). This EUA will remain  in effect (meaning this test can be used) for the duration of the COVID-19 declaration under Se ction 564(b)(1) of the Act, 21 U.S.C. section 360bbb-3(b)(1), unless the authorization is terminated or revoked sooner.  Performed at [facility],  [address] [place], [place] [address]   Surgical pcr screen     Status: None   Collection Time: 03/24/21  4:23 PM   Specimen: Nasal Mucosa; Nasal Swab  Result Value Ref Range Status   MRSA, PCR NEGATIVE NEGATIVE Final   Staphylococcus aureus NEGATIVE NEGATIVE Final    Comment: (NOTE) The Xpert SA Assay (FDA approved for NASAL specimens in patients 41 years of age and older), is one component of a comprehensive surveillance program. It is not intended to diagnose infection nor to guide or monitor treatment. Performed at [facility], [address], [place], [place] [address]   Aerobic/Anaerobic Culture w Gram Stain (surgical/deep wound)     Status: None (Preliminary result)   Collection Time: 03/24/21  4:43 PM   Specimen: Soft Tissue, Other  Result Value Ref Range Status   Specimen Description TISSUE LEFT FOOT  Final   Special Requests DEEP CULTURE SWAB  Final   Gram Stain NO WBC SEEN NO ORGANISMS SEEN   Final   Culture   Final    CULTURE REINCUBATED FOR BETTER GROWTH Performed at [facility], [address] [place], [place] [address]    Report Status PENDING  Incomplete     Studies: No results found.    Joycelyn Das, MD  [facility] 03/27/2021  If 7PM-7AM, please contact night-coverage

## 2021-03-28 ENCOUNTER — Inpatient Hospital Stay: Payer: Self-pay

## 2021-03-28 DIAGNOSIS — G4733 Obstructive sleep apnea (adult) (pediatric): Secondary | ICD-10-CM

## 2021-03-28 DIAGNOSIS — M313 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: Secondary | ICD-10-CM

## 2021-03-28 DIAGNOSIS — M869 Osteomyelitis, unspecified: Secondary | ICD-10-CM

## 2021-03-28 DIAGNOSIS — G8929 Other chronic pain: Secondary | ICD-10-CM

## 2021-03-28 DIAGNOSIS — E669 Obesity, unspecified: Secondary | ICD-10-CM

## 2021-03-28 DIAGNOSIS — N183 Chronic kidney disease, stage 3 unspecified: Secondary | ICD-10-CM

## 2021-03-28 DIAGNOSIS — D500 Iron deficiency anemia secondary to blood loss (chronic): Secondary | ICD-10-CM

## 2021-03-28 LAB — CBC
HCT: 29.2 % — ABNORMAL LOW (ref 36.0–46.0)
Hemoglobin: 8.6 g/dL — ABNORMAL LOW (ref 12.0–15.0)
MCH: 26.1 pg (ref 26.0–34.0)
MCHC: 29.5 g/dL — ABNORMAL LOW (ref 30.0–36.0)
MCV: 88.5 fL (ref 80.0–100.0)
Platelets: 329 10*3/uL (ref 150–400)
RBC: 3.3 MIL/uL — ABNORMAL LOW (ref 3.87–5.11)
RDW: 17.1 % — ABNORMAL HIGH (ref 11.5–15.5)
WBC: 9.5 10*3/uL (ref 4.0–10.5)
nRBC: 0 % (ref 0.0–0.2)

## 2021-03-28 LAB — BASIC METABOLIC PANEL
Anion gap: 4 — ABNORMAL LOW (ref 5–15)
BUN: 9 mg/dL (ref 6–20)
CO2: 26 mmol/L (ref 22–32)
Calcium: 8.4 mg/dL — ABNORMAL LOW (ref 8.9–10.3)
Chloride: 112 mmol/L — ABNORMAL HIGH (ref 98–111)
Creatinine, Ser: 1.01 mg/dL — ABNORMAL HIGH (ref 0.44–1.00)
GFR, Estimated: >60 mL/min (ref >60)
Glucose, Bld: 88 mg/dL (ref 70–99)
Potassium: 3.9 mmol/L (ref 3.5–5.1)
Sodium: 142 mmol/L (ref 135–145)

## 2021-03-28 LAB — GLUCOSE, CAPILLARY
Glucose-Capillary: 89 mg/dL (ref 70–99)
Glucose-Capillary: 93 mg/dL (ref 70–99)
Glucose-Capillary: 155 mg/dL — ABNORMAL HIGH (ref 70–99)
Glucose-Capillary: 142 mg/dL — ABNORMAL HIGH (ref 70–99)
Glucose-Capillary: 110 mg/dL — ABNORMAL HIGH (ref 70–99)

## 2021-03-28 LAB — MAGNESIUM: Magnesium: 2 mg/dL (ref 1.7–2.4)

## 2021-03-28 LAB — BRAIN NATRIURETIC PEPTIDE: B Natriuretic Peptide: 98.5 pg/mL (ref 0.0–100.0)

## 2021-03-28 MED ORDER — VANCOMYCIN HCL 1500 MG/300ML IV SOLN
1500.0000 mg | Freq: Once | INTRAVENOUS | Status: AC
  Administered 2021-03-28: 1500 mg via INTRAVENOUS
  Filled 2021-03-28: qty 300

## 2021-03-28 MED ORDER — FLUCONAZOLE 200 MG PO TABS
400.0000 mg | ORAL_TABLET | Freq: Every day | ORAL | Status: DC
  Administered 2021-03-28 – 2021-03-30 (×3): 400 mg via ORAL
  Filled 2021-03-28 (×3): qty 2

## 2021-03-28 MED ORDER — VANCOMYCIN HCL 1250 MG/250ML IV SOLN
1250.0000 mg | INTRAVENOUS | Status: DC
  Filled 2021-03-28: qty 250

## 2021-03-28 NOTE — Progress Notes (Signed)
Pharmacy Antibiotic Note  [name] is a 55 y.o. female admitted on 03/24/2021 with osteomyelitis of the left fifth metatarsal head as well as the right second toe. She is s/p transmetatarsal amputation on the left and toe amputation on the right 6/2. There is concern for residual infection of the left foot. Deep tissue cultures are showing enterococcus and candida albicans. Pharmacy has been consulted for vancomycin dosing. Scr up 1.01 today with CrCl ~ 70 ml/min. WBC WNL and patient afebrile. Noted last dose of Vancomycin was on 6/2.   Plan: Vancomycin 1500 mg x 1 then  Vancomycin 1250 mg every 24 hours  Estimated AUC 512 with trough ~ 11.  Monitor culture data and renal function closely   Height: 5\' 6"  (167.6 cm) Weight: 91.6 kg (201 lb 15.1 oz) IBW/kg (Calculated) : 59.3  Temp (24hrs), Avg:98.3 F (36.8 C), Min:97.9 F (36.6 C), Max:98.7 F (37.1 C)  Recent Labs  Lab 03/24/21 1207 03/25/21 0058 03/26/21 0254 03/27/21 0133 03/28/21 0511  WBC 12.1* 11.3* 12.2* 10.1 9.5  CREATININE 0.97 0.86 0.92 0.85 1.01*    Estimated Creatinine Clearance: 71.7 mL/min (A) (by C-G formula based on SCr of 1.01 mg/dL (H)).    Allergies  Allergen Reactions  . Ibuprofen Other (See Comments)    Pt is unable to take this due to kidney problems.     . Penicillins Rash and Other (See Comments)    Has patient had a PCN reaction causing immediate rash, facial/tongue/throat swelling, SOB or lightheadedness with hypotension: No Has patient had a PCN reaction causing severe rash involving mucus membranes or skin necrosis: No Has patient had a PCN reaction that required hospitalization No Has patient had a PCN reaction occurring within the last 10 years: No If all of the above answers are "NO", then may proceed with Cephalosporin use.      Thank you for allowing pharmacy to be a part of this patient's care.  05/28/21, PharmD, BCPS, BCIDP Infectious Diseases Clinical Pharmacist Phone:  [phone] 03/28/2021 12:45 PM

## 2021-03-28 NOTE — Progress Notes (Signed)
Penicillin Allergy Assessment   Patient history of penicillin or beta lactam allergy: [name] reports that she experienced her penicillin allergy as a child  (40-50 years ago). She reports that the allergy was a rash with no swelling or shortness of breath. She did not require medical care at the time of the allergy. This was a low risk allergy.   I spoke with [name] and she is willing to try a penicillin based antibiotic in the hospital pending susceptibilities from her culture.   Sharin Mons, PharmD, BCPS, BCIDP Infectious Diseases Clinical Pharmacist Phone: [phone] 03/28/2021 2:32 PM

## 2021-03-28 NOTE — Progress Notes (Addendum)
OT Cancellation Note  Patient Details Name: Sarafina Puthoff MRN: [id number] DOB: 1966-01-03   Cancelled Treatment:    Reason Eval/Treat Not Completed: Patient declined, no reason specified;Other (comment) pt requested to eat breakfast prior to OT session, will check back as time allows.  Returned around 10 AM for OT session with pt reporting she had just worked with PT declining OT session, will continue efforts as time allows.   Pollyann Glen K., COTA/L Acute Rehabilitation Services [phone]   Barron Schmid 03/28/2021, 9:02 AM

## 2021-03-28 NOTE — Consult Note (Signed)
[facility]    Date of Admission:  03/24/2021                         Total days of antibiotics 5                                                                                                                                                   Reason for Consult: Osteomyelitis of bilateral feet                             Referring Provider: Joycelyn Das, MD Primary Care Provider: Antony Haste, MD  Assessment: Patient was consulted for Candida albicans and Enterococcus faecalis grew on left surgical wound.  Patient has had osteomyelitis in the Dec 2021 status post left TMA and right 1st and 3rd toes amputation.  Wound culture at that time grew MRSA and Pseudomonas which she was treated with dalbavancin and oral ciprofloxacin.  Patient was then admitted to [facility] on 5/31 for altered mental status, found to have osteomyelitis of the right 2nd and left 5th metatarsal.  She underwent right TMA and left 5th metatarsal amputation at [facility] by Dr. Victorino Dike.  Left surgical wound grew Candida albicans and Enterococcus faecalis.  The culture was reintubated for better growth.  I have spoken to Dr. Victorino Dike.  He has tried to debride the wound all the way to the healthy bone but it was difficult to be sure if all the infected bone was excised.  We agree on a 6 weeks course of antibiotics given unclear margin excision and immunocompromised state.  We will add vancomycin for E faecalis and oral fluconazole for Candida albicans.  Will stop Flagyl given no anaerobes isolated in the culture.  Patient is on Rocephin for UTI, in which 5-day course should be sufficient.  Plan: 1. Stop Flagyl and Rocephin 2. Start vancomycin and oral fluconazole.  Anticipate 6 weeks treatment course 3. 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 acidophilus  2 capsule Oral TID  . docusate sodium  100 mg Oral BID  . DULoxetine  60 mg Oral BID  . famotidine  20 mg Oral Daily  . fluconazole  400 mg Oral Daily  . folic acid  2 mg Oral Daily  . furosemide  40 mg Oral Daily  . gabapentin  900 mg Oral Daily   And  .  gabapentin  600 mg Oral Daily   And  . gabapentin  1,200 mg Oral QPM  . insulin aspart  0-6 Units Subcutaneous TID WC  . magnesium oxide  400 mg Oral BID  . montelukast  10 mg Oral QHS  . mupirocin ointment   Topical Daily  . pantoprazole  40 mg Oral Daily  . potassium chloride  40 mEq Oral QODAY  . predniSONE  7.5 mg Oral Q breakfast  . tapentadol  75 mg Oral QID  . topiramate  100 mg Oral Daily  . traZODone  100 mg Oral QHS   Continuous Infusions: . sodium chloride Stopped (03/25/21 0401)  . magnesium sulfate bolus IVPB    . [START ON 03/29/2021] vancomycin    . vancomycin 1,500 mg (03/28/21 1423)   PRN Meds:.sodium chloride, acetaminophen **OR** acetaminophen, HYDROmorphone (DILAUDID) injection, lidocaine, magnesium sulfate bolus IVPB, metoCLOPramide **OR** metoCLOPramide (REGLAN) injection, ondansetron **OR** ondansetron (ZOFRAN) IV, oxyCODONE, oxyCODONE, potassium chloride, tiZANidine  HPI: [name] is a 55 y.o. female with past medical history of [place] on chronic steroids, ANCA vasculitis, CKD 3A, steroid-induced diabetes, history of osteomyelitis status post left TMA and right first and third toe amputation in December 2021.  Patient states that she has had ingrown toenails for 2 years but never healed which led to her amputation.  States that after the surgery, she was wearing a wrong size boots that irritate her foot and causes bone blister.  See that she follows with wound care regularly.  Patient is  currently feeling well.  She endorses fever on admission but has resolved.  She reports dysuria on admission.   Review of Systems: Review of Systems  Constitutional: Negative for chills and fever.  Genitourinary: Positive for dysuria.  Skin: Positive for rash.    Past Medical History:  Diagnosis Date  . Chronic cough   . Chronic pain   . Diabetes mellitus without complication (HCC)   . Dyspnea   . GERD (gastroesophageal reflux disease)   . Hypokalemia   . Hypothyroidism   . Neurocardiogenic syncope   . OSA (obstructive sleep apnea)    does not use CPAP  . Osteomyelitis (HCC)    bilateral feet  . Peripheral neuropathy   . Presence of intrathecal pump    recieves Prialt/bupivicaine  . Wegener's granulomatosis 2009  . Wegner's disease (congenital syphilitic osteochondritis)     Social History   Tobacco Use  . Smoking status: Never Smoker  . Smokeless tobacco: Never Used  Vaping Use  . Vaping Use: Never used  Substance Use Topics  . Alcohol use: Yes    Comment: occasionally  . Drug use: No    Family History  Problem Relation Age of Onset  . Diabetes Mother   . Heart disease Mother   . Diabetes Father    Allergies  Allergen Reactions  . Ibuprofen Other (See Comments)    Pt is unable to take this due to kidney problems.     . Penicillins Rash and Other (See Comments)    Has patient had a PCN reaction causing immediate rash, facial/tongue/throat swelling, SOB or lightheadedness with hypotension: No Has patient had a PCN reaction causing severe rash involving mucus membranes or skin necrosis: No Has patient had a PCN reaction that required hospitalization No Has patient had a PCN reaction occurring within the last 10 years: No If all of the above answers are "NO", then may proceed with Cephalosporin use.    OBJECTIVE: Blood pressure 120/79, pulse 69,  temperature 98.7 F (37.1 C), temperature source Oral, resp. rate 18, height 5\' 6"  (1.676 m), weight 91.6 kg,  SpO2 99 %.  Physical Exam Physical Exam Constitutional:      General: She is not in acute distress.    Appearance: She is not ill-appearing or toxic-appearing.  HENT:     Head: Normocephalic.  Eyes:     General: Scleral icterus present.        Right eye: Discharge present.        Left eye: Discharge present.    Conjunctiva/sclera: Conjunctivae normal.  Cardiovascular:     Rate and Rhythm: Normal rate and regular rhythm.     Heart sounds: Normal heart sounds.     Comments: No LE edema bilaterally Pulmonary:     Effort: Pulmonary effort is normal. No respiratory distress.  Musculoskeletal:     Cervical back: Normal range of motion and neck supple.     Comments: Status post bilateral TMA.  Bandaged of bilateral foot appears clean.  Bilateral legs are warm to touch, no pain to palpation.  Skin:    Comments: Scatter excoriation marks noted bilateral legs and arms.  Neurological:     Mental Status: She is alert.  Psychiatric:        Mood and Affect: Mood normal.        Thought Content: Thought content normal.        Judgment: Judgment normal.   Lab Results Lab Results  Component Value Date   WBC 9.5 03/28/2021   HGB 8.6 (L) 03/28/2021   HCT 29.2 (L) 03/28/2021   MCV 88.5 03/28/2021   PLT 329 03/28/2021    Lab Results  Component Value Date   CREATININE 1.01 (H) 03/28/2021   BUN 9 03/28/2021   NA 142 03/28/2021   K 3.9 03/28/2021   CL 112 (H) 03/28/2021   CO2 26 03/28/2021    Lab Results  Component Value Date   ALT 14 03/26/2021   AST 18 03/26/2021   ALKPHOS 61 03/26/2021   BILITOT 0.5 03/26/2021     Microbiology: Recent Results (from the past 240 hour(s))  SARS CORONAVIRUS 2 (TAT 6-24 HRS) Nasopharyngeal Nasopharyngeal Swab     Status: None   Collection Time: 03/24/21 12:37 PM   Specimen: Nasopharyngeal Swab  Result Value Ref Range Status   SARS Coronavirus 2 NEGATIVE NEGATIVE Final    Comment: (NOTE) SARS-CoV-2 target nucleic acids are NOT DETECTED.  The  SARS-CoV-2 RNA is generally detectable in upper and lower respiratory specimens during the acute phase of infection. Negative results do not preclude SARS-CoV-2 infection, do not rule out co-infections with other pathogens, and should not be used as the sole basis for treatment or other patient management decisions. Negative results must be combined with clinical observations, patient history, and epidemiological information. The expected result is Negative.  Fact Sheet for Patients: 05/24/21  Fact Sheet for Healthcare Providers: HairSlick.no  This test is not yet approved or cleared by the [place] FDA and  has been authorized for detection and/or diagnosis of SARS-CoV-2 by FDA under an Emergency Use Authorization (EUA). This EUA will remain  in effect (meaning this test can be used) for the duration of the COVID-19 declaration under Se ction 564(b)(1) of the Act, 21 U.S.C. section 360bbb-3(b)(1), unless the authorization is terminated or revoked sooner.  Performed at [facility], [address], [place], [place]   Surgical pcr screen     Status: None   Collection  Time: 03/24/21  4:23 PM   Specimen: Nasal Mucosa; Nasal Swab  Result Value Ref Range Status   MRSA, PCR NEGATIVE NEGATIVE Final   Staphylococcus aureus NEGATIVE NEGATIVE Final    Comment: (NOTE) The Xpert SA Assay (FDA approved for NASAL specimens in patients 55 years of age and older), is one component of a comprehensive surveillance program. It is not intended to diagnose infection nor to guide or monitor treatment. Performed at [facility], [address] [place], [place] [address]   Aerobic/Anaerobic Culture w Gram Stain (surgical/deep wound)     Status: None (Preliminary result)   Collection Time: 03/24/21  4:43 PM   Specimen: Soft Tissue, Other  Result Value Ref Range Status   Specimen Description TISSUE LEFT FOOT   Final   Special Requests DEEP CULTURE SWAB  Final   Gram Stain   Final    NO WBC SEEN NO ORGANISMS SEEN Performed at [facility], [address] [place], [place] [address]    Culture   Final    RARE CANDIDA ALBICANS RARE ENTEROCOCCUS FAECALIS SUSCEPTIBILITIES TO FOLLOW NO ANAEROBES ISOLATED; CULTURE IN PROGRESS FOR 5 DAYS    Report Status PENDING  Incomplete    Doran StablerQuan  Philicia Heyne, [facility] [phone] pager   [phone] cell 03/28/2021, 4:00 PM

## 2021-03-28 NOTE — Progress Notes (Signed)
Subjective: 4 Days Post-Op Procedure(s) (LRB): TRANSMETATARSAL AMPUTATION (Left) AMPUTATION TOE (Right)  Objective: Vital signs in last 24 hours: Temp:  [97.9 F (36.6 C)-98.7 F (37.1 C)] 98.4 F (36.9 C) (06/06 1119) Pulse Rate:  [63-69] 69 (06/06 1119) Resp:  [16-18] 18 (06/06 1119) BP: (89-140)/(57-86) 89/69 (06/06 1119) SpO2:  [96 %-98 %] 97 % (06/06 1119)  Intake/Output from previous day: 06/05 0701 - 06/06 0700 In: 240 [P.O.:240] Out: 2800 [Urine:2800] Intake/Output this shift: Total I/O In: -  Out: 500 [Urine:500]  Recent Labs    03/26/21 0254 03/27/21 0133 03/28/21 0511  HGB 8.1* 8.5* 8.6*   Recent Labs    03/27/21 0133 03/28/21 0511  WBC 10.1 9.5  RBC 3.22* 3.30*  HCT 28.7* 29.2*  PLT 314 329   Recent Labs    03/27/21 0133 03/28/21 0511  NA 141 142  K 3.8 3.9  CL 111 112*  CO2 27 26  BUN 7 9  CREATININE 0.85 1.01*  GLUCOSE 100* 88  CALCIUM 8.0* 8.4*   No results for input(s): LABPT, INR in the last 72 hours.  Cultures results show e faecilis and candida.  Sensitivities pending.   Assessment/Plan: 4 Days Post-Op Procedure(s) (LRB): TRANSMETATARSAL AMPUTATION (Left) AMPUTATION TOE (Right)  Spoke with Dr. Cyndie Chime with ID regarding surgical findings at the left foot.  Given immune compromise I'd recommend more aggressive abx management.  OK to continue wbat in cam boot on right and post op shoe on left.   Toni Arthurs 03/28/2021, 11:57 AM

## 2021-03-28 NOTE — Progress Notes (Deleted)
[facility]    Date of Admission:  03/24/2021   Total days of antibiotics 5               Reason for Consult: Osteomyelitis of bilateral feet    Referring Provider: Joycelyn DasLaxman Pokhrel, MD Primary Care Provider: Antony HasteMichael Badger, MD  Assessment: Patient was consulted for Candida albicans and Enterococcus faecalis grew on left surgical wound.  Patient has had osteomyelitis in the Dec 2021 status post left TMA and right 1st and 3rd toes amputation.  Wound culture at that time grew MRSA and Pseudomonas which she was treated with dalbavancin and oral ciprofloxacin.  Patient was then [facility] on [facility] for altered mental status, found to have osteomyelitis of the right 2nd and left 5th metatarsal.  She underwent right TMA and left 5th metatarsal [facility] by Dr. Victorino DikeHewitt.  Left surgical wound grew Candida albicans and Enterococcus faecalis.  The culture was reintubated for better growth.  I have spoken to Dr. Victorino DikeHewitt.  He has tried to debride the wound all the way to the healthy bone but it was difficult to be sure if all the infected bone was excised.  We agree on a 6 weeks course of antibiotics given unclear margin excision and immunocompromised state.  We will add vancomycin for E faecalis and oral fluconazole for Candida albicans.  Will stop Flagyl given no anaerobes isolated in the culture.  Patient is on Rocephin for UTI, in which 5-day course should be sufficient.  Plan: 1. Stop Flagyl and Rocephin 2. Start vancomycin and oral fluconazole.  Anticipate 6 weeks treatment course 3. 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 acidophilus  2 capsule Oral TID  . docusate sodium  100 mg Oral BID  . DULoxetine  60 mg Oral BID  . famotidine  20 mg Oral Daily  . fluconazole  400 mg Oral Daily  . folic acid  2 mg Oral Daily  . furosemide  40 mg Oral Daily  . gabapentin  900 mg Oral Daily   And  . gabapentin  600 mg Oral Daily   And  . gabapentin  1,200 mg Oral QPM  . insulin aspart  0-6 Units Subcutaneous TID WC  . magnesium oxide  400 mg Oral BID  . montelukast  10 mg Oral QHS  . mupirocin ointment   Topical Daily  . pantoprazole  40 mg Oral Daily  . potassium chloride  40 mEq Oral QODAY  . predniSONE  7.5 mg Oral Q breakfast  . tapentadol  75 mg Oral QID  . topiramate  100 mg Oral Daily  . traZODone  100 mg Oral QHS   Continuous Infusions: . sodium chloride Stopped (03/25/21 0401)  . magnesium sulfate bolus IVPB     PRN Meds:.sodium chloride, acetaminophen **OR** acetaminophen, HYDROmorphone (DILAUDID) injection, lidocaine, magnesium sulfate bolus IVPB, metoCLOPramide **OR** metoCLOPramide (REGLAN) injection, ondansetron **OR** ondansetron (ZOFRAN) IV, oxyCODONE, oxyCODONE, potassium chloride, tiZANidine  HPI: [name] is a 55 y.o. female with past medical history of [place] on chronic steroids, ANCA vasculitis, CKD 3A, steroid-induced diabetes, history  of osteomyelitis status post left TMA and right first and third toe amputation in December 2021.  Patient states that she has had ingrown toenails for 2 years but never healed which led to her amputation.  States that after the surgery, she was wearing a wrong size boots that irritate her foot and causes bone blister.  See that she follows with wound care regularly.  Patient is currently feeling well.  She endorses fever on admission but has resolved.  She reports dysuria on admission.   Review of Systems: Review of Systems  Constitutional: Negative for chills and fever.  Genitourinary: Positive for dysuria.  Skin: Positive  for rash.    Past Medical History:  Diagnosis Date  . Chronic cough   . Chronic pain   . Diabetes mellitus without complication (HCC)   . Dyspnea   . GERD (gastroesophageal reflux disease)   . Hypokalemia   . Hypothyroidism   . Neurocardiogenic syncope   . OSA (obstructive sleep apnea)    does not use CPAP  . Osteomyelitis (HCC)    bilateral feet  . Peripheral neuropathy   . Presence of intrathecal pump    recieves Prialt/bupivicaine  . Wegener's granulomatosis 2009  . Wegner's disease (congenital syphilitic osteochondritis)     Social History   Tobacco Use  . Smoking status: Never Smoker  . Smokeless tobacco: Never Used  Vaping Use  . Vaping Use: Never used  Substance Use Topics  . Alcohol use: Yes    Comment: occasionally  . Drug use: No    Family History  Problem Relation Age of Onset  . Diabetes Mother   . Heart disease Mother   . Diabetes Father    Allergies  Allergen Reactions  . Ibuprofen Other (See Comments)    Pt is unable to take this due to kidney problems.     . Penicillins Rash and Other (See Comments)    Has patient had a PCN reaction causing immediate rash, facial/tongue/throat swelling, SOB or lightheadedness with hypotension: No Has patient had a PCN reaction causing severe rash involving mucus membranes or skin necrosis: No Has patient had a PCN reaction that required hospitalization No Has patient had a PCN reaction occurring within the last 10 years: No If all of the above answers are "NO", then may proceed with Cephalosporin use.    OBJECTIVE: Blood pressure (!) 89/69, pulse 69, temperature 98.4 F (36.9 C), temperature source Oral, resp. rate 18, height 5\' 6"  (1.676 m), weight 91.6 kg, SpO2 97 %.  Physical Exam Constitutional:      General: She is not in acute distress.    Appearance: She is not ill-appearing or toxic-appearing.  HENT:     Head: Normocephalic.  Eyes:     General: Scleral icterus present.        Right eye:  Discharge present.        Left eye: Discharge present.    Conjunctiva/sclera: Conjunctivae normal.  Cardiovascular:     Rate and Rhythm: Normal rate and regular rhythm.     Heart sounds: Normal heart sounds.     Comments: No LE edema bilaterally Pulmonary:     Effort: Pulmonary effort is normal. No respiratory distress.  Musculoskeletal:     Cervical back: Normal range of motion and neck supple.     Comments: Status post bilateral TMA.  Bandaged of bilateral foot appears clean.  Bilateral legs are warm to touch, no pain to palpation.  Skin:    Comments: Scatter excoriation  marks noted bilateral legs and arms.  Neurological:     Mental Status: She is alert.  Psychiatric:        Mood and Affect: Mood normal.        Thought Content: Thought content normal.        Judgment: Judgment normal.     Lab Results Lab Results  Component Value Date   WBC 9.5 03/28/2021   HGB 8.6 (L) 03/28/2021   HCT 29.2 (L) 03/28/2021   MCV 88.5 03/28/2021   PLT 329 03/28/2021    Lab Results  Component Value Date   CREATININE 1.01 (H) 03/28/2021   BUN 9 03/28/2021   NA 142 03/28/2021   K 3.9 03/28/2021   CL 112 (H) 03/28/2021   CO2 26 03/28/2021    Lab Results  Component Value Date   ALT 14 03/26/2021   AST 18 03/26/2021   ALKPHOS 61 03/26/2021   BILITOT 0.5 03/26/2021     Microbiology: Recent Results (from the past 240 hour(s))  SARS CORONAVIRUS 2 (TAT 6-24 HRS) Nasopharyngeal Nasopharyngeal Swab     Status: None   Collection Time: 03/24/21 12:37 PM   Specimen: Nasopharyngeal Swab  Result Value Ref Range Status   SARS Coronavirus 2 NEGATIVE NEGATIVE Final    Comment: (NOTE) SARS-CoV-2 target nucleic acids are NOT DETECTED.  The SARS-CoV-2 RNA is generally detectable in upper and lower respiratory specimens during the acute phase of infection. Negative results do not preclude SARS-CoV-2 infection, do not rule out co-infections with other pathogens, and should not be used as the sole  basis for treatment or other patient management decisions. Negative results must be combined with clinical observations, patient history, and epidemiological information. The expected result is Negative.  Fact Sheet for Patients: HairSlick.no  Fact Sheet for Healthcare Providers: quierodirigir.com  This test is not yet approved or cleared by the [place] FDA and  has been authorized for detection and/or diagnosis of SARS-CoV-2 by FDA under an Emergency Use Authorization (EUA). This EUA will remain  in effect (meaning this test can be used) for the duration of the COVID-19 declaration under Se ction 564(b)(1) of the Act, 21 U.S.C. section 360bbb-3(b)(1), unless the authorization is terminated or revoked sooner.  Performed at [facility], [address], [place], [place] [address]   Surgical pcr screen     Status: None   Collection Time: 03/24/21  4:23 PM   Specimen: Nasal Mucosa; Nasal Swab  Result Value Ref Range Status   MRSA, PCR NEGATIVE NEGATIVE Final   Staphylococcus aureus NEGATIVE NEGATIVE Final    Comment: (NOTE) The Xpert SA Assay (FDA approved for NASAL specimens in patients 30 years of age and older), is one component of a comprehensive surveillance program. It is not intended to diagnose infection nor to guide or monitor treatment. Performed at [facility], [address] [place], [place] [address]   Aerobic/Anaerobic Culture w Gram Stain (surgical/deep wound)     Status: None (Preliminary result)   Collection Time: 03/24/21  4:43 PM   Specimen: Soft Tissue, Other  Result Value Ref Range Status   Specimen Description TISSUE LEFT FOOT  Final   Special Requests DEEP CULTURE SWAB  Final   Gram Stain   Final    NO WBC SEEN NO ORGANISMS SEEN Performed at [facility], [address], [place], [place] [address]    Culture   Final    RARE CANDIDA ALBICANS RARE ENTEROCOCCUS  FAECALIS SUSCEPTIBILITIES  TO FOLLOW NO ANAEROBES ISOLATED; CULTURE IN PROGRESS FOR 5 DAYS    Report Status PENDING  Incomplete    Doran Stabler, [facility] [phone] pager   [phone] cell 03/28/2021, 12:08 PM

## 2021-03-28 NOTE — Progress Notes (Signed)
PROGRESS NOTE  [name] UGQ:[id number] DOB: 1966-07-21 DOA: 03/24/2021 PCP: Eartha Inch, MD   LOS: 4 days   Brief narrative:  [name] is a 55 y.o. 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  She had been trying to get her early scheduled Rituxan infusion for treatment of her Wegener's granulomatosis 2 days ago when she was noted to be lethargic and acutely altered.  In the ED initial ABG showed pH of 7.2 , PCO2 49.9, and PCO2 76.8.  Symptoms were thought to be multifactorial in nature secondary to multiple high risk medications in addition to patient not using CPAP at night.  She was started on CPAP nightly with improvement in patient's mental status.  Urinalysis was concerning for infection and patient had reported some concerns for dysuria.  She had been initially started on Rocephin IV.  During her initial evaluation she was also noted to have a left lateral foot stump and right second toe wound for which MRI was obtained.  MRI revealed osteomyelitis of the right second toe distal phalanx with nonspecific subcutaneous edema, and osteomyelitis of the left fifth metatarsal remaining base with adjacent ulcer with surrounding nonspecific edema.  Metronidazole was added to her medication regimen.  Dr. Victorino Dike of orthopedics was consulted and patient was transferred to [facility] and [facility] for further evaluation and treatment.  Patient underwent amputation and is currently on broad-spectrum antibiotics awaiting for blood cultures.  ID has been consulted and orthopedics on board as well.  Assessment/Plan:  Principal Problem:   Osteomyelitis (HCC) Active Problems:   OSA  (obstructive sleep apnea)   CKD (chronic kidney disease) stage 3, GFR 30-59 ml/min (HCC)   Normocytic anemia   Obesity (BMI 30-39.9)   ANCA-associated vasculitis (HCC)   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)   Chronic pain  Acute on chronic osteomyelitis:  Patient was found to have osteomyelitis of the left fifth  metatarsal head as well as right second toe at the outside facility.  Currently on Rocephin and metronidazole.  Status post transmetatarsal amputation by orthopedics.  Continue continue empiric antibiotics at this time.  Orthopedics on board.  Follow-up bone culture no growth so far.  Spoke with orthopedics and recommended infectious disease consultation recommendation..  Urinary tract infection: Present on admission at outside facility.  On 5/31 patient had abnormal urinalysis.  Received IV Rocephin, started on 5/31.  Leukocytosis: Mild secondary to osteomyelitis.  Normalized  Diabetes mellitus type 2:  Latest hemoglobin A1c of 5.9.  Metformin on hold.  Continue sliding scale insulin, Accu-Cheks, diabetic diet.  Latest POC glucose of 155.  Normocytic anemia: No bleeding.  Likely anemia of chronic disease as well.  Closely monitor.  Latest hemoglobin of 8.6.  No need for blood transfusion.  Wegener's granulomatosis, ANCA associated vasculitis: Patient is on Rituxan infusions, but was unable to get her last infusion scheduled on 5/31 due to confusion.  Home regimen includes prednisone 7.5 mg daily, methotrexate, Rituxan infusions.  She is followed in the outpatient setting by  rheumatology at [facility].  Continue prednisone and folic acid.  Hypokalemia:  Resolved with replacement.  Magnesium level of 2.0.  Chronic pain On Nucynta, gabapentin and Cymbalta.  Chronically on  it.  We will continue with that  OSA:   Noncompliance.   Continue CPAP while in the hospital..  Will benefit from sleep study as outpatient.  GERD On Pepcid  Mild diarrhea,  epigastric discomfort.  Likely antibiotic associated diarrhea.  Continue Protonix, probiotic.  Looks like patient has chronic diarrhea.  Has been started on Imodium.    Dizziness, hypotension postural.  Likely volume depletion.  Improved.  Received IV fluid hydration.  Obesity: BMI 32.59 kg/m.  Would benefit from lifestyle modification and weight loss on a ongoing basis.  DVT prophylaxis: SCD's Start: 03/24/21 2024 SCDs Start: 03/24/21 1813    Code Status: Full code  Family Communication: None  Status is: Inpatient  Remains inpatient appropriate because:IV treatments appropriate due to intensity of illness or inability to take PO and Inpatient level of care appropriate due to severity of illness, IV antibiotics,   Dispo: The patient is from: Home              Anticipated d/c is to: Home, patient did not require any special physical therapy needs              Patient currently is not medically stable to d/c.   Difficult to place patient No   Consultants:  Orthopedics  Procedures:  Right Achilles tendon percutaneous lengthening, right foot trans-metatarsal amputation, left fifth metatarsal ray amputation on 03/24/2021   Anti-infectives:  Marland Kitchen. Vancomycin, Rocephin, Metro IV  Anti-infectives (From admission, onward)   Start     Dose/Rate Route Frequency Ordered Stop   03/29/21 1400  vancomycin (VANCOREADY) IVPB 1250 mg/250 mL        1,250 mg 166.7 mL/hr over 90 Minutes Intravenous Every 24 hours 03/28/21 1254     03/28/21 1400  vancomycin (VANCOREADY) IVPB 1500 mg/300 mL        1,500 mg 150 mL/hr over 120 Minutes Intravenous  Once 03/28/21 1254     03/28/21 1300  fluconazole (DIFLUCAN) tablet 400 mg        400 mg Oral Daily 03/28/21 1207     03/25/21 1200  vancomycin (VANCOREADY) IVPB 1250 mg/250 mL  Status:  Discontinued        1,250 mg 166.7 mL/hr over 90 Minutes Intravenous Every 24 hours 03/24/21 1216 03/24/21 1249   03/25/21 0600  vancomycin (VANCOCIN) IVPB 1000  mg/200 mL premix        1,000 mg 200 mL/hr over 60 Minutes Intravenous On call to O.R. 03/24/21 1459 03/24/21 1608   03/24/21 1625  vancomycin (VANCOCIN) powder  Status:  Discontinued          As needed 03/24/21 1626 03/24/21 1708   03/24/21 1215  cefTRIAXone (ROCEPHIN) 2 g in sodium chloride 0.9 % 100 mL IVPB  Status:  Discontinued        2 g 200 mL/hr over 30 Minutes Intravenous Every 24 hours 03/24/21 1126 03/28/21 1207   03/24/21 1215  metroNIDAZOLE (FLAGYL) IVPB 500 mg  Status:  Discontinued        500 mg 100 mL/hr over 60 Minutes Intravenous Every 8 hours 03/24/21 1126 03/28/21 1207   03/24/21 1215  vancomycin (VANCOREADY) IVPB 1750 mg/350 mL  Status:  Discontinued        1,750 mg 175 mL/hr over 120 Minutes Intravenous  Once 03/24/21 1128 03/24/21 1249     Subjective: Today, patient was seen and examined at bedside.  Feels okay.  Has chronic diarrhea.  Denies any nausea vomiting fever chills  Objective: Vitals:  03/28/21 1104 03/28/21 1119  BP:  (!) 89/69  Pulse:  69  Resp:  18  Temp:  98.4 F (36.9 C)  SpO2: 97% 97%    Intake/Output Summary (Last 24 hours) at 03/28/2021 1539 Last data filed at 03/28/2021 0811 Gross per 24 hour  Intake 240 ml  Output 3300 ml  Net -3060 ml   Filed Weights   03/24/21 1038  Weight: 91.6 kg   Body mass index is 32.59 kg/m.   Physical Exam: General: Obese, alert awake and communicative.   HENT:   No scleral pallor or icterus noted. Oral mucosa is moist.  Chest:  Clear breath sounds.  Diminished breath sounds bilaterally. No crackles or wheezes.  CVS: S1 &S2 heard. No murmur.  Regular rate and rhythm. Abdomen: Soft, non tender, nondistended.  Bowel sounds are heard.   Extremities: No cyanosis, clubbing or edema.  Peripheral pulses are palpable.  Bilateral foot on wraps Psych: Alert, awake and oriented, normal mood CNS:  No cranial nerve deficits.  Power equal in all extremities.   Skin: Warm and dry.  Bilateral foot on  wraps.   Data Review: I have personally reviewed the following laboratory data and studies,  CBC: Recent Labs  Lab 03/24/21 1207 03/25/21 0058 03/26/21 0254 03/27/21 0133 03/28/21 0511  WBC 12.1* 11.3* 12.2* 10.1 9.5  NEUTROABS 8.7*  --   --   --   --   HGB 9.1* 8.2* 8.1* 8.5* 8.6*  HCT 30.1* 27.1* 27.4* 28.7* 29.2*  MCV 88.3 88.6 90.4 89.1 88.5  PLT 337 312 321 314 329   Basic Metabolic Panel: Recent Labs  Lab 03/24/21 1207 03/25/21 0058 03/26/21 0254 03/27/21 0133 03/28/21 0511  NA 140 141 140 141 142  K 3.1* 3.7 4.1 3.8 3.9  CL 115* 115* 111 111 112*  CO2 21* 23 24 27 26   GLUCOSE 162* 109* 91 100* 88  BUN 7 6 5* 7 9  CREATININE 0.97 0.86 0.92 0.85 1.01*  CALCIUM 7.9* 8.1* 7.6* 8.0* 8.4*  MG  --   --  2.1  --  2.0  PHOS  --   --  2.5  --   --    Liver Function Tests: Recent Labs  Lab 03/24/21 1207 03/26/21 0254  AST 22 18  ALT 16 14  ALKPHOS 60 61  BILITOT 0.7 0.5  PROT 4.9* 4.8*  ALBUMIN 2.8* 2.6*   No results for input(s): LIPASE, AMYLASE in the last 168 hours. No results for input(s): AMMONIA in the last 168 hours. Cardiac Enzymes: No results for input(s): CKTOTAL, CKMB, CKMBINDEX, TROPONINI in the last 168 hours. BNP (last 3 results) No results for input(s): BNP in the last 8760 hours.  ProBNP (last 3 results) No results for input(s): PROBNP in the last 8760 hours.  CBG: Recent Labs  Lab 03/27/21 1624 03/27/21 2119 03/28/21 0610 03/28/21 0738 03/28/21 1146  GLUCAP 172* 159* 89 93 155*   Recent Results (from the past 240 hour(s))  SARS CORONAVIRUS 2 (TAT 6-24 HRS) Nasopharyngeal Nasopharyngeal Swab     Status: None   Collection Time: 03/24/21 12:37 PM   Specimen: Nasopharyngeal Swab  Result Value Ref Range Status   SARS Coronavirus 2 NEGATIVE NEGATIVE Final    Comment: (NOTE) SARS-CoV-2 target nucleic acids are NOT DETECTED.  The SARS-CoV-2 RNA is generally detectable in upper and lower respiratory specimens during the acute phase  of infection. Negative results do not preclude SARS-CoV-2 infection, do not rule out co-infections with other pathogens,  and should not be used as the sole basis for treatment or other patient management decisions. Negative results must be combined with clinical observations, patient history, and epidemiological information. The expected result is Negative.  Fact Sheet for Patients: HairSlick.no  Fact Sheet for Healthcare Providers: quierodirigir.com  This test is not yet approved or cleared by the [place] FDA and  has been authorized for detection and/or diagnosis of SARS-CoV-2 by FDA under an Emergency Use Authorization (EUA). This EUA will remain  in effect (meaning this test can be used) for the duration of the COVID-19 declaration under Se ction 564(b)(1) of the Act, 21 U.S.C. section 360bbb-3(b)(1), unless the authorization is terminated or revoked sooner.  Performed at [facility], [address], [place], [place] [address]   Surgical pcr screen     Status: None   Collection Time: 03/24/21  4:23 PM   Specimen: Nasal Mucosa; Nasal Swab  Result Value Ref Range Status   MRSA, PCR NEGATIVE NEGATIVE Final   Staphylococcus aureus NEGATIVE NEGATIVE Final    Comment: (NOTE) The Xpert SA Assay (FDA approved for NASAL specimens in patients 47 years of age and older), is one component of a comprehensive surveillance program. It is not intended to diagnose infection nor to guide or monitor treatment. Performed at [facility], [address] [place], [place] [address]   Aerobic/Anaerobic Culture w Gram Stain (surgical/deep wound)     Status: None (Preliminary result)   Collection Time: 03/24/21  4:43 PM   Specimen: Soft Tissue, Other  Result Value Ref Range Status   Specimen Description TISSUE LEFT FOOT  Final   Special Requests DEEP CULTURE SWAB  Final   Gram Stain   Final    NO WBC SEEN NO ORGANISMS  SEEN Performed at [facility], [address] [place], [place] [address]    Culture   Final    RARE CANDIDA ALBICANS RARE ENTEROCOCCUS FAECALIS SUSCEPTIBILITIES TO FOLLOW NO ANAEROBES ISOLATED; CULTURE IN PROGRESS FOR 5 DAYS    Report Status PENDING  Incomplete     Studies: No results found.    Joycelyn Das, MD  [facility] 03/28/2021  If 7PM-7AM, please contact night-coverage

## 2021-03-28 NOTE — Progress Notes (Signed)
Physical Therapy Treatment Patient Details Name: [name] MRN: [id number] DOB: 14-Mar-1966 Today's Date: 03/28/2021    History of Present Illness The pt is a 55 yo female presenting 6/2 due to bilateral foot infections. 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    PT Comments    Pt progressing towards goals. Able to increased gait distance, but still limited by pain. Discussed use of WC at home to help with ease of mobility and pain management. Pt reports family could bump her up steps in [facility] as they have had to do before. Current recommendations appropriate. Will continue to follow acutely.     Follow Up Recommendations  No PT follow up;Supervision for mobility/OOB     Equipment Recommendations  None recommended by PT    Recommendations for Other Services       Precautions / Restrictions Precautions Precautions: Fall Required Braces or Orthoses: Other Brace Other Brace: R cam walker boot, L post-op shoe Restrictions Weight Bearing Restrictions: Yes RLE Weight Bearing: Weight bearing as tolerated LLE Weight Bearing: Weight bearing as tolerated Other Position/Activity Restrictions: RLE WBAT in CAM walker and LLE in post op shoe    Mobility  Bed Mobility Overal bed mobility: Modified Independent                  Transfers Overall transfer level: Needs assistance Equipment used: Rolling walker (2 wheeled) Transfers: Sit to/from Stand Sit to Stand: Min guard         General transfer comment: Min guard for safety  Ambulation/Gait Ambulation/Gait assistance: Min guard Gait Distance (Feet): 15 Feet Assistive device: Rolling walker (2 wheeled) Gait Pattern/deviations: Decreased stride length;Step-to pattern;Ataxic Gait velocity:  decreased   General Gait Details: very small steps with increased relaince on UE support due to pain. no LOB or evidence of instability. Cues for sequencing using RW.   Stairs             Wheelchair Mobility    Modified Rankin (Stroke Patients Only)       Balance Overall balance assessment: Needs assistance;History of Falls Sitting-balance support: Feet supported Sitting balance-Leahy Scale: Good     Standing balance support: Bilateral upper extremity supported Standing balance-Leahy Scale: Poor Standing balance comment: needs RW for support                            Cognition Arousal/Alertness: Awake/alert Behavior During Therapy: WFL for tasks assessed/performed Overall Cognitive Status: Within Functional Limits for tasks assessed                                 General Comments: pt calm and pleasant. able to follow all instructions and demo good understanding of safety      Exercises General Exercises - Lower Extremity Heel Slides: AROM;Both;10 reps;Supine    General Comments General comments (skin integrity, edema, etc.): Discussed using WC to help with pain management.      Pertinent Vitals/Pain Pain Assessment: Faces Faces Pain Scale: Hurts even more Pain Location: bilateral feet, incision Pain Descriptors / Indicators: Discomfort;Operative site guarding;Sore Pain Intervention(s): Limited activity within patient's tolerance;Monitored during session;Repositioned    Home Living  Prior Function            PT Goals (current goals can now be found in the care plan section) Acute Rehab PT Goals Patient Stated Goal: Return home and recover PT Goal Formulation: With patient Time For Goal Achievement: 04/09/21 Potential to Achieve Goals: Good Progress towards PT goals: Progressing toward goals    Frequency    Min 3X/week      PT Plan Current plan remains appropriate    Co-evaluation               AM-PAC PT "6 Clicks" Mobility   Outcome Measure  Help needed turning from your back to your side while in a flat bed without using bedrails?: None Help needed moving from lying on your back to sitting on the side of a flat bed without using bedrails?: None Help needed moving to and from a bed to a chair (including a wheelchair)?: A Little Help needed standing up from a chair using your arms (e.g., wheelchair or bedside chair)?: A Little Help needed to walk in hospital room?: A Little Help needed climbing 3-5 steps with a railing? : A Little 6 Click Score: 20    End of Session Equipment Utilized During Treatment: Gait belt (R cam walker boot, L post-op shoe) Activity Tolerance: Patient tolerated treatment well;Patient limited by pain Patient left: with call bell/phone within reach;in chair Nurse Communication: Mobility status PT Visit Diagnosis: Other abnormalities of gait and mobility (R26.89);Pain Pain - Right/Left: Right (and Left) Pain - part of body: Ankle and joints of foot     Time: 0981-1914 PT Time Calculation (min) (ACUTE ONLY): 25 min  Charges:  $Gait Training: 8-22 mins $Therapeutic Activity: 8-22 mins                     [name], DPT  Acute Rehabilitation Services  Pager: [phone] Office: [phone]    [name] 03/28/2021, 10:34 AM

## 2021-03-29 LAB — CBC
HCT: 31.3 % — ABNORMAL LOW (ref 36.0–46.0)
Hemoglobin: 9.1 g/dL — ABNORMAL LOW (ref 12.0–15.0)
MCH: 25.9 pg — ABNORMAL LOW (ref 26.0–34.0)
MCHC: 29.1 g/dL — ABNORMAL LOW (ref 30.0–36.0)
MCV: 88.9 fL (ref 80.0–100.0)
Platelets: 341 10*3/uL (ref 150–400)
RBC: 3.52 MIL/uL — ABNORMAL LOW (ref 3.87–5.11)
RDW: 17.2 % — ABNORMAL HIGH (ref 11.5–15.5)
WBC: 11.9 10*3/uL — ABNORMAL HIGH (ref 4.0–10.5)
nRBC: 0 % (ref 0.0–0.2)

## 2021-03-29 LAB — GLUCOSE, CAPILLARY
Glucose-Capillary: 95 mg/dL (ref 70–99)
Glucose-Capillary: 121 mg/dL — ABNORMAL HIGH (ref 70–99)
Glucose-Capillary: 150 mg/dL — ABNORMAL HIGH (ref 70–99)
Glucose-Capillary: 135 mg/dL — ABNORMAL HIGH (ref 70–99)

## 2021-03-29 LAB — MAGNESIUM: Magnesium: 2.2 mg/dL (ref 1.7–2.4)

## 2021-03-29 MED ORDER — SODIUM CHLORIDE 0.9 % IV SOLN
2.0000 g | INTRAVENOUS | Status: DC
  Administered 2021-03-29 – 2021-03-30 (×7): 2 g via INTRAVENOUS
  Filled 2021-03-29 (×4): qty 2000
  Filled 2021-03-29: qty 2
  Filled 2021-03-29 (×6): qty 2000

## 2021-03-29 MED ORDER — FLUCONAZOLE 200 MG PO TABS: 400.0000 mg | ORAL_TABLET | Freq: Every day | ORAL | 0 refills | Status: AC

## 2021-03-29 MED ORDER — AMPICILLIN IV (FOR PTA / DISCHARGE USE ONLY): 12.0000 g | INTRAVENOUS | 0 refills | Status: DC

## 2021-03-29 MED ORDER — CHLORHEXIDINE GLUCONATE CLOTH 2 % EX PADS
6.0000 | MEDICATED_PAD | Freq: Every day | CUTANEOUS | Status: DC
  Administered 2021-03-29 – 2021-03-30 (×2): 6 via TOPICAL

## 2021-03-29 MED ORDER — SODIUM CHLORIDE 0.9% FLUSH
10.0000 mL | Freq: Two times a day (BID) | INTRAVENOUS | Status: DC
  Administered 2021-03-29 – 2021-03-30 (×3): 10 mL

## 2021-03-29 MED ORDER — SODIUM CHLORIDE 0.9% FLUSH: 10.0000 mL | INTRAVENOUS | Status: DC | PRN

## 2021-03-29 NOTE — Progress Notes (Signed)
Peripherally Inserted Central Catheter Placement  The IV Nurse has discussed with the patient and/or persons authorized to consent for the patient, the purpose of this procedure and the potential benefits and risks involved with this procedure.  The benefits include less needle sticks, lab draws from the catheter, and the patient may be discharged home with the catheter. Risks include, but not limited to, infection, bleeding, blood clot (thrombus formation), and puncture of an artery; nerve damage and irregular heartbeat and possibility to perform a PICC exchange if needed/ordered by physician.  Alternatives to this procedure were also discussed.  Bard Power PICC patient education guide, fact sheet on infection prevention and patient information card has been provided to patient /or left at bedside.    PICC Placement Documentation  PICC Single Lumen 03/29/21 Right Basilic 38 cm 0 cm (Active)  Indication for Insertion or Continuance of Line Prolonged intravenous therapies 03/29/21 0950  Exposed Catheter (cm) 0 cm 03/29/21 0950  Site Assessment Clean;Dry;Intact 03/29/21 0950  Line Status Flushed;Saline locked;Blood return noted 03/29/21 0950  Dressing Type Transparent 03/29/21 0950  Dressing Status Clean;Dry;Intact 03/29/21 0950  Antimicrobial disc in place? Yes 03/29/21 0950  Dressing Intervention New dressing;Other (Comment) 03/29/21 0950  Dressing Change Due 04/05/21 03/29/21 0950       Reginia Forts Albarece 03/29/2021, 9:51 AM

## 2021-03-29 NOTE — Plan of Care (Signed)

## 2021-03-29 NOTE — Progress Notes (Signed)
Physical Therapy Treatment Patient Details Name: [name] MRN: [id number] DOB: April 08, 1966 Today's Date: 03/29/2021    History of Present Illness The pt is a 55 yo female presenting 6/2 due to bilateral foot infections. 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    PT Comments    Patient received in bed, agrees to PT session. Requests to use bathroom prior. Patient is mod independent with bed mobility. Supervision for sit to stand and min guard for pivoting to [facility] and then ambulation of 12 feet. Patient pain limited. Balance is good and she has good safety awareness. Patient will continue to benefit from skilled PT while here to improve independence.       Follow Up Recommendations  No PT follow up;Supervision for mobility/OOB     Equipment Recommendations  None recommended by PT    Recommendations for Other Services       Precautions / Restrictions Precautions Precautions: Fall Required Braces or Orthoses: Other Brace Other Brace: R cam walker boot, L post-op shoe Restrictions Weight Bearing Restrictions: Yes RLE Weight Bearing: Weight bearing as tolerated LLE Weight Bearing: Weight bearing as tolerated Other Position/Activity Restrictions: RLE WBAT in CAM walker and LLE in post op shoe    Mobility  Bed Mobility Overal bed mobility: Modified Independent Bed Mobility: Supine to Sit     Supine to sit: Modified independent (Device/Increase time)          Transfers Overall transfer level: Needs assistance Equipment used: Rolling walker (2 wheeled) Transfers: Sit to/from [employer] Sit to Stand: Supervision Stand pivot transfers: Supervision       General transfer comment: supervision for  safety  Ambulation/Gait Ambulation/Gait assistance: Min guard Gait Distance (Feet): 12 Feet Assistive device: Rolling walker (2 wheeled) Gait Pattern/deviations: Decreased stride length;Decreased step length - right;Decreased step length - left Gait velocity: decreased   General Gait Details: very small steps with increased relaince on UE support due to pain. no LOB or evidence of instability.   Stairs             Wheelchair Mobility    Modified Rankin (Stroke Patients Only)       Balance Overall balance assessment: Needs assistance;History of Falls Sitting-balance support: Feet supported Sitting balance-Leahy Scale: Good     Standing balance support: Bilateral upper extremity supported;During functional activity Standing balance-Leahy Scale: Fair Standing balance comment: needs RW for support                            Cognition Arousal/Alertness: Awake/alert Behavior During Therapy: WFL for tasks assessed/performed Overall Cognitive Status: Within Functional Limits for tasks assessed                                 General Comments: pt calm and pleasant. able to follow all instructions and demo good understanding of safety      Exercises      General Comments        Pertinent Vitals/Pain Pain Assessment: 0-10 Pain Score: 7  Pain Location: bilateral feet, incision Pain Descriptors / Indicators: Discomfort;Operative site guarding;Sore Pain Intervention(s): Limited activity within patient's tolerance;Monitored during session;Repositioned    Home Living  Prior Function            PT Goals (current goals can now be found in the care plan section) Acute Rehab PT Goals Patient Stated Goal: Return home and recover PT Goal Formulation: With patient Time For Goal Achievement: 04/09/21 Potential to Achieve Goals: Good Progress towards PT goals: Progressing toward goals    Frequency    Min  3X/week      PT Plan Current plan remains appropriate    Co-evaluation              AM-PAC PT "6 Clicks" Mobility   Outcome Measure  Help needed turning from your back to your side while in a flat bed without using bedrails?: None Help needed moving from lying on your back to sitting on the side of a flat bed without using bedrails?: None Help needed moving to and from a bed to a chair (including a wheelchair)?: A Little Help needed standing up from a chair using your arms (e.g., wheelchair or bedside chair)?: A Little Help needed to walk in hospital room?: A Little Help needed climbing 3-5 steps with a railing? : A Little 6 Click Score: 20    End of Session Equipment Utilized During Treatment: Gait belt Activity Tolerance: Patient tolerated treatment well;Patient limited by pain Patient left: with call bell/phone within reach;in chair Nurse Communication: Mobility status PT Visit Diagnosis: Other abnormalities of gait and mobility (R26.89);Pain;Difficulty in walking, not elsewhere classified (R26.2) Pain - Right/Left:  (bilateral) Pain - part of body: Ankle and joints of foot     Time: 1497-0263 PT Time Calculation (min) (ACUTE ONLY): 18 min  Charges:  $Gait Training: 8-22 mins                     [employer], PT, GCS 03/29/21,12:22 PM

## 2021-03-29 NOTE — Progress Notes (Signed)
PROGRESS NOTE  [name] OYD:[id number] DOB: 1965/11/26 DOA: 03/24/2021 PCP: Eartha Inch, MD   LOS: 5 days   Brief narrative:  [name] is a 55 y.o. 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  She had been trying to get her early scheduled Rituxan infusion for treatment of her Wegener's granulomatosis 2 days ago when she was noted to be lethargic and acutely altered.  In the ED initial ABG showed pH of 7.2 , PCO2 49.9, and PCO2 76.8.  Symptoms were thought to be multifactorial in nature secondary to multiple high risk medications in addition to patient not using CPAP at night.  She was started on CPAP nightly with improvement in patient's mental status.  Urinalysis was concerning for infection and patient had reported some concerns for dysuria.  She had been initially started on Rocephin IV.  During her initial evaluation she was also noted to have a left lateral foot stump and right second toe wound for which MRI was obtained.  MRI revealed osteomyelitis of the right second toe distal phalanx with nonspecific subcutaneous edema, and osteomyelitis of the left fifth metatarsal remaining base with adjacent ulcer with surrounding nonspecific edema.  Metronidazole was added to her medication regimen.  Dr. Victorino Dike of orthopedics was consulted and patient was transferred to [facility] and [facility] for further evaluation and treatment.  Assessment/Plan:  Principal Problem:   Osteomyelitis (HCC) Active Problems:   OSA (obstructive sleep apnea)   CKD (chronic kidney disease) stage 3, GFR 30-59 ml/min (HCC)   Normocytic anemia   Obesity (BMI 30-39.9)   ANCA-associated  vasculitis (HCC)   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)   Chronic pain  Acute on chronic osteomyelitis:  Patient was found to have osteomyelitis of the left fifth  metatarsal head as well as right second toe at the outside facility.  This has been since 2 ampicillin 2 g every 4 hours at this time.  Status post transmetatarsal amputation by orthopedics.  Bone culture with rare Candida and Enterococcus.  ID recommended fluconazole and ampicillin on discharge.  Will arrange for home health.    Urinary tract infection: Present on admission at outside facility.  On 5/31 patient had abnormal urinalysis.  Completed course of antibiotic.  Leukocytosis: Mild secondary to osteomyelitis.  Mild.  WBC of 11.9.  Diabetes mellitus type 2:  Latest hemoglobin A1c of 5.9.  Metformin on hold.  Continue sliding scale insulin, Accu-Cheks, diabetic diet.  Latest POC glucose of 121.  Will resume metformin on discharge.  Normocytic anemia: No bleeding.  Likely anemia of chronic disease as well.  Closely monitor.  Latest hemoglobin of 9.1..  No need for blood transfusion.  Wegener's granulomatosis, ANCA associated vasculitis: Patient is on Rituxan infusions, but was unable to get her last infusion scheduled on 5/31 due to confusion.  Home regimen includes prednisone 7.5 mg daily, methotrexate, Rituxan infusions.  She is followed in the outpatient setting by  rheumatology at [facility].  Continue prednisone and folic acid.  Hypokalemia:  Resolved with replacement.  Magnesium level of 2.0.  Chronic pain On Nucynta, gabapentin and Cymbalta.  Chronically on it.  We will continue with that  OSA:   Noncompliance.  Continue CPAP while in the hospital..  Will benefit from sleep study as outpatient.  GERD On Pepcid  Mild diarrhea, epigastric discomfort.  Likely antibiotic associated diarrhea.  Continue Protonix, probiotic.  Looks like patient has chronic diarrhea.  Has been started on  Imodium.  Advised her to follow-up with her primary care physician and get a GI referral for chronic diarrhea.  Dizziness, hypotension postural.  Resolved at this time.  Obesity: BMI 32.59 kg/m.  Would benefit from lifestyle modification and weight loss on a ongoing basis.  DVT prophylaxis: SCD's Start: 03/24/21 2024 SCDs Start: 03/24/21 1813   Code Status: Full code  Family Communication: Spoke with the patient's spouse on the phone  Status is: Inpatient  Remains inpatient appropriate because:IV treatments appropriate due to intensity of illness or inability to take PO and Inpatient level of care appropriate due to severity of illness, IV antibiotics,   Dispo: The patient is from: Home              Anticipated d/c is to: Home, patient did not require any special physical therapy needs, likely tomorrow.              Patient currently is not medically stable to d/c.   Difficult to place patient No   Consultants:  Orthopedics  Procedures:  Right Achilles tendon percutaneous lengthening, right foot trans-metatarsal amputation, left fifth metatarsal ray amputation on 03/24/2021   Anti-infectives:  Marland Kitchen. Ampicillin IV. Marland Kitchen. Fluconazole  Anti-infectives (From admission, onward)   Start     Dose/Rate Route Frequency Ordered Stop   03/30/21 0000  fluconazole (DIFLUCAN) 200 MG tablet        400 mg Oral Daily 03/29/21 1508 05/08/21 2359   03/29/21 1600  ampicillin (OMNIPEN) 2 g in sodium chloride 0.9 % 100 mL IVPB        2 g 300 mL/hr over 20 Minutes Intravenous Every 4 hours 03/29/21 1435     03/29/21 1400  vancomycin (VANCOREADY) IVPB 1250 mg/250 mL  Status:  Discontinued        1,250 mg 166.7 mL/hr over 90 Minutes Intravenous Every 24 hours 03/28/21 1254 03/29/21 1353   03/29/21 0000  ampicillin IVPB        12 g Intravenous Every 24 hours 03/29/21 1508 05/08/21 2359   03/28/21 1400  vancomycin (VANCOREADY) IVPB 1500 mg/300 mL        1,500 mg 150 mL/hr over 120 Minutes Intravenous   Once 03/28/21 1254 03/28/21 1623   03/28/21 1300  fluconazole (DIFLUCAN) tablet 400 mg        400 mg Oral Daily 03/28/21 1207     03/25/21 1200  vancomycin (VANCOREADY) IVPB 1250 mg/250 mL  Status:  Discontinued        1,250 mg 166.7 mL/hr over 90 Minutes Intravenous Every 24 hours 03/24/21 1216 03/24/21 1249   03/25/21 0600  vancomycin (VANCOCIN) IVPB 1000 mg/200 mL premix        1,000 mg 200 mL/hr over 60 Minutes Intravenous On call to O.R. 03/24/21 1459 03/24/21 1608   03/24/21 1625  vancomycin (VANCOCIN) powder  Status:  Discontinued          As needed 03/24/21 1626 03/24/21 1708   03/24/21 1215  cefTRIAXone (ROCEPHIN) 2 g in sodium chloride 0.9 % 100 mL IVPB  Status:  Discontinued        2 g 200 mL/hr over 30 Minutes Intravenous Every 24 hours 03/24/21 1126 03/28/21 1207   03/24/21 1215  metroNIDAZOLE (FLAGYL)  IVPB 500 mg  Status:  Discontinued        500 mg 100 mL/hr over 60 Minutes Intravenous Every 8 hours 03/24/21 1126 03/28/21 1207   03/24/21 1215  vancomycin (VANCOREADY) IVPB 1750 mg/350 mL  Status:  Discontinued        1,750 mg 175 mL/hr over 120 Minutes Intravenous  Once 03/24/21 1128 03/24/21 1249     Subjective: Today, patient was seen and examined at bedside.  Denies any nausea vomiting fever chills or rigor.  Objective: Vitals:   03/29/21 0806 03/29/21 1130  BP: 96/72 114/68  Pulse: 71 66  Resp: 16 16  Temp: 98.4 F (36.9 C) 98.2 F (36.8 C)  SpO2: 96% 94%    Intake/Output Summary (Last 24 hours) at 03/29/2021 1523 Last data filed at 03/29/2021 1300 Gross per 24 hour  Intake 240 ml  Output 2730 ml  Net -2490 ml   Filed Weights   03/24/21 1038  Weight: 91.6 kg   Body mass index is 32.59 kg/m.   Physical Exam: General: Obese built, not in obvious distress HENT:   No scleral pallor or icterus noted. Oral mucosa is moist.  Chest:  Clear breath sounds.  Diminished breath sounds bilaterally. No crackles or wheezes.  CVS: S1 &S2 heard. No murmur.  Regular  rate and rhythm. Abdomen: Soft, nonspecific discomfort on palpation., nondistended.  Bowel sounds are heard.   Extremities: Bilateral foot unwrapped.  Psych: Alert, awake and oriented, normal mood CNS:  No cranial nerve deficits.  Power equal in all extremities.   Skin: Warm and dry.  Bilateral foot on wraps.  Data Review: I have personally reviewed the following laboratory data and studies,  CBC: Recent Labs  Lab 03/24/21 1207 03/25/21 0058 03/26/21 0254 03/27/21 0133 03/28/21 0511 03/29/21 0507  WBC 12.1* 11.3* 12.2* 10.1 9.5 11.9*  NEUTROABS 8.7*  --   --   --   --   --   HGB 9.1* 8.2* 8.1* 8.5* 8.6* 9.1*  HCT 30.1* 27.1* 27.4* 28.7* 29.2* 31.3*  MCV 88.3 88.6 90.4 89.1 88.5 88.9  PLT 337 312 321 314 329 341   Basic Metabolic Panel: Recent Labs  Lab 03/24/21 1207 03/25/21 0058 03/26/21 0254 03/27/21 0133 03/28/21 0511 03/29/21 0507  NA 140 141 140 141 142  --   K 3.1* 3.7 4.1 3.8 3.9  --   CL 115* 115* 111 111 112*  --   CO2 21* 23 24 27 26   --   GLUCOSE 162* 109* 91 100* 88  --   BUN 7 6 5* 7 9  --   CREATININE 0.97 0.86 0.92 0.85 1.01*  --   CALCIUM 7.9* 8.1* 7.6* 8.0* 8.4*  --   MG  --   --  2.1  --  2.0 2.2  PHOS  --   --  2.5  --   --   --    Liver Function Tests: Recent Labs  Lab 03/24/21 1207 03/26/21 0254  AST 22 18  ALT 16 14  ALKPHOS 60 61  BILITOT 0.7 0.5  PROT 4.9* 4.8*  ALBUMIN 2.8* 2.6*   No results for input(s): LIPASE, AMYLASE in the last 168 hours. No results for input(s): AMMONIA in the last 168 hours. Cardiac Enzymes: No results for input(s): CKTOTAL, CKMB, CKMBINDEX, TROPONINI in the last 168 hours. BNP (last 3 results) Recent Labs    03/28/21 1638  BNP 98.5    ProBNP (last 3 results) No results for input(s): PROBNP in  the last 8760 hours.  CBG: Recent Labs  Lab 03/28/21 1146 03/28/21 1600 03/28/21 2149 03/29/21 0647 03/29/21 1140  GLUCAP 155* 142* 110* 95 121*   Recent Results (from the past 240 hour(s))  SARS  CORONAVIRUS 2 (TAT 6-24 HRS) Nasopharyngeal Nasopharyngeal Swab     Status: None   Collection Time: 03/24/21 12:37 PM   Specimen: Nasopharyngeal Swab  Result Value Ref Range Status   SARS Coronavirus 2 NEGATIVE NEGATIVE Final    Comment: (NOTE) SARS-CoV-2 target nucleic acids are NOT DETECTED.  The SARS-CoV-2 RNA is generally detectable in upper and lower respiratory specimens during the acute phase of infection. Negative results do not preclude SARS-CoV-2 infection, do not rule out co-infections with other pathogens, and should not be used as the sole basis for treatment or other patient management decisions. Negative results must be combined with clinical observations, patient history, and epidemiological information. The expected result is Negative.  Fact Sheet for Patients: HairSlick.no  Fact Sheet for Healthcare Providers: quierodirigir.com  This test is not yet approved or cleared by the [place] FDA and  has been authorized for detection and/or diagnosis of SARS-CoV-2 by FDA under an Emergency Use Authorization (EUA). This EUA will remain  in effect (meaning this test can be used) for the duration of the COVID-19 declaration under Se ction 564(b)(1) of the Act, 21 U.S.C. section 360bbb-3(b)(1), unless the authorization is terminated or revoked sooner.  Performed at [facility], [address], [place], [place] [address]   Surgical pcr screen     Status: None   Collection Time: 03/24/21  4:23 PM   Specimen: Nasal Mucosa; Nasal Swab  Result Value Ref Range Status   MRSA, PCR NEGATIVE NEGATIVE Final   Staphylococcus aureus NEGATIVE NEGATIVE Final    Comment: (NOTE) The Xpert SA Assay (FDA approved for NASAL specimens in patients 40 years of age and older), is one component of a comprehensive surveillance program. It is not intended to diagnose infection nor to guide or monitor treatment. Performed at  [facility], [address], [place], [place] [address]   Aerobic/Anaerobic Culture w Gram Stain (surgical/deep wound)     Status: None (Preliminary result)   Collection Time: 03/24/21  4:43 PM   Specimen: Soft Tissue, Other  Result Value Ref Range Status   Specimen Description TISSUE LEFT FOOT  Final   Special Requests DEEP CULTURE SWAB  Final   Gram Stain   Final    NO WBC SEEN NO ORGANISMS SEEN Performed at [facility], [address] [place], [place] [address]    Culture   Final    RARE CANDIDA ALBICANS RARE ENTEROCOCCUS FAECALIS NO ANAEROBES ISOLATED; CULTURE IN PROGRESS FOR 5 DAYS    Report Status PENDING  Incomplete   Organism ID, Bacteria ENTEROCOCCUS FAECALIS  Final      Susceptibility   Enterococcus faecalis - MIC*    AMPICILLIN <=2 SENSITIVE Sensitive     VANCOMYCIN 1 SENSITIVE Sensitive     * RARE ENTEROCOCCUS FAECALIS     Studies: [place] EKG SITE RITE  Result Date: 03/28/2021 If Site Rite image not attached, placement could not be confirmed due to current cardiac rhythm.     Joycelyn Das, MD  [facility] 03/29/2021  If 7PM-7AM, please contact night-coverage

## 2021-03-29 NOTE — Progress Notes (Addendum)
[facility] for Infectious Disease   Reason for visit: Follow up on osteomyelitis  Interval History: Enterococcus sensitvities noted, ampicillin sensitive.  WBC 11.9, remains afebrile. No complaints.    day 6 total antibiotics [place] 2 fluconazole  Physical Exam: Constitutional:  Vitals:   03/29/21 0806 03/29/21 1130  BP: 96/72 114/68  Pulse: 71 66  Resp: 16 16  Temp: 98.4 F (36.9 C) 98.2 F (36.8 C)  SpO2: 96% 94%   patient appears in NAD Respiratory: Normal respiratory effort; CTA B Cardiovascular: RRR GI: soft, nt, nd MS: bilateral feet wrapped  Review of Systems: Constitutional: negative for fevers and chills Gastrointestinal: negative for diarrhea Integument/breast: negative for rash  Lab Results  Component Value Date   WBC 11.9 (H) 03/29/2021   HGB 9.1 (L) 03/29/2021   HCT 31.3 (L) 03/29/2021   MCV 88.9 03/29/2021   PLT 341 03/29/2021    Lab Results  Component Value Date   CREATININE 1.01 (H) 03/28/2021   BUN 9 03/28/2021   NA 142 03/28/2021   K 3.9 03/28/2021   CL 112 (H) 03/28/2021   CO2 26 03/28/2021    Lab Results  Component Value Date   ALT 14 03/26/2021   AST 18 03/26/2021   ALKPHOS 61 03/26/2021     Microbiology: Recent Results (from the past 240 hour(s))  SARS CORONAVIRUS 2 (TAT 6-24 HRS) Nasopharyngeal Nasopharyngeal Swab     Status: None   Collection Time: 03/24/21 12:37 PM   Specimen: Nasopharyngeal Swab  Result Value Ref Range Status   SARS Coronavirus 2 NEGATIVE NEGATIVE Final    Comment: (NOTE) SARS-CoV-2 target nucleic acids are NOT DETECTED.  The SARS-CoV-2 RNA is generally detectable in upper and lower respiratory specimens during the acute phase of infection. Negative results do not preclude SARS-CoV-2 infection, do not rule out co-infections with other pathogens, and should not be used as the sole basis for treatment or other patient management decisions. Negative results must be combined with clinical  observations, patient history, and epidemiological information. The expected result is Negative.  Fact Sheet for Patients: SugarRoll.be  Fact Sheet for Healthcare Providers: https://www.woods-mathews.com/  This test is not yet approved or cleared by the [place] FDA and  has been authorized for detection and/or diagnosis of SARS-CoV-2 by FDA under an Emergency Use Authorization (EUA). This EUA will remain  in effect (meaning this test can be used) for the duration of the COVID-19 declaration under Se ction 564(b)(1) of the Act, 21 U.S.C. section 360bbb-3(b)(1), unless the authorization is terminated or revoked sooner.  Performed at [facility], [place] [address] [place], [place] [address]   Surgical pcr screen     Status: None   Collection Time: 03/24/21  4:23 PM   Specimen: Nasal Mucosa; Nasal Swab  Result Value Ref Range Status   MRSA, PCR NEGATIVE NEGATIVE Final   Staphylococcus aureus NEGATIVE NEGATIVE Final    Comment: (NOTE) The Xpert SA Assay (FDA approved for NASAL specimens in patients 31 years of age and older), is one component of a comprehensive surveillance program. It is not intended to diagnose infection nor to guide or monitor treatment. Performed at [facility], [place] [address], [place], [place] [address]   Aerobic/Anaerobic Culture w Gram Stain (surgical/deep wound)     Status: None (Preliminary result)   Collection Time: 03/24/21  4:43 PM   Specimen: Soft Tissue, Other  Result Value Ref Range Status   Specimen Description TISSUE LEFT FOOT  Final   Special Requests DEEP CULTURE SWAB  Final   Gram Stain   Final    NO WBC SEEN NO ORGANISMS SEEN Performed at [facility], [address], [place], [place] [address]    Culture   Final    RARE CANDIDA ALBICANS RARE ENTEROCOCCUS FAECALIS NO ANAEROBES ISOLATED; CULTURE IN PROGRESS FOR 5 DAYS    Report Status PENDING  Incomplete   Organism ID,  Bacteria ENTEROCOCCUS FAECALIS  Final      Susceptibility   Enterococcus faecalis - MIC*    AMPICILLIN <=2 SENSITIVE Sensitive     VANCOMYCIN 1 SENSITIVE Sensitive     * RARE ENTEROCOCCUS FAECALIS    Impression/Plan:  1. Osteomyelitis - s/p debridement and amputation of both feet but some concern of residual infection on the left per Dr. Doran Durand.  Culture intraoperative with Enterococcus and C albicans.   Sensitivities noted and is ampicillin sensitive.   Will change to ampicillin and she will continue with oral fluconazole.  Plan for 6 weeks opat  2.  Access - picc line has been placed.  3.  Medication monitoring - labs per home health.    4.  Penicillin allergy history - low risk allergy.  Will challenge.    Diagnosis: Left foot osteomyelitis  Culture Result: Enterococcus and C albicans  Allergies  Allergen Reactions  . Ibuprofen Other (See Comments)    Pt is unable to take this due to kidney problems.     . Penicillins Rash and Other (See Comments)    Has patient had a PCN reaction causing immediate rash, facial/tongue/throat swelling, SOB or lightheadedness with hypotension: No Has patient had a PCN reaction causing severe rash involving mucus membranes or skin necrosis: No Has patient had a PCN reaction that required hospitalization No Has patient had a PCN reaction occurring within the last 10 years: No If all of the above answers are "NO", then may proceed with Cephalosporin use.    OPAT Orders Discharge antibiotics to be given via PICC line Discharge antibiotics: ampicillin and oral fluconazole 400 mg po daily Per pharmacy protocol yes Duration: 6 weeks End Date: 05/08/21  [facility] Care Per Protocol: yes  Home health RN for IV administration and teaching; PICC line care and labs.    Labs weekly while on IV antibiotics: _x_ CBC with differential __ BMP _x_ CMP _x_ CRP _x_ ESR __ Vancomycin trough __ CK  _x_ Please pull PIC at completion of IV  antibiotics __ Please leave PIC in place until doctor has seen patient or been notified  Fax weekly labs to [phone]  Clinic Follow Up Appt: 04/22/21 with Dr. Linus Salmons  @ 2pm

## 2021-03-29 NOTE — Progress Notes (Addendum)
PHARMACY CONSULT NOTE FOR:  OUTPATIENT  PARENTERAL ANTIBIOTIC THERAPY (OPAT)  Indication: Left foot osteomyelitis  Regimen: Ampicillin 12 gm every 24 hours as a continuous infusion  End date: 05/08/21  Noted patient [place] the beach [place]18 -6[place]25 where it would be hard to do ampicillin. During this time period we will do Daptomycin 600 mg every 24 hours and then we will switch her back to ampicillin.   IV antibiotic discharge orders are pended. To discharging provider:  please sign these orders via discharge navigator,  Select New Orders & click on the button choice - Manage This Unsigned Work.     Thank you for allowing pharmacy to be a part of this patient's care.  Sharin Mons, PharmD, BCPS, BCIDP Infectious Diseases Clinical Pharmacist Phone: [phone] 03/29/2021, 2:37 PM

## 2021-03-29 NOTE — TOC Initial Note (Signed)
Transition of Care [facility]) - Initial/Assessment Note    Patient Details  Name: [name] MRN: [id number] Date of Birth: [birth year]  Transition of Care [facility]) CM/SW Contact:    Pollie Friar, RN Phone Number: 03/29/2021, 11:54 AM  Clinical Narrative:                 Plan is for d/c home tomorrow with IV abx. CM met with the patient and she has PICC line in place. CM inquired about [facility] agency for the [facility] RN and she has used [facility] in the past and would like to use them again. Cory with [facility] aware.  Pam with [facility] also consulted for the IV abx. She will meet with the patient and spouse to provide needed education.  Pt states she has all needed DME at home and her father provides needed transportation. TOC following.    Barriers to Discharge: Continued Medical Work up   Patient Goals and CMS Choice   CMS Medicare.gov Compare Post Acute Care list provided to:: Patient Choice offered to / list presented to : Patient  Expected Discharge Plan and Services                                     HH Arranged: RN [facility] Agency: [place] Date [place]: 03/29/21   Representative spoke with at [place]: Cory/ Pam  Prior Living Arrangements/Services                       Activities of Daily Living      Permission Sought/Granted                  Emotional Assessment              Admission diagnosis:  Osteomyelitis of right foot [facility]) [M86.9] Patient Active Problem List   Diagnosis Date Noted  . Osteomyelitis ([place]) 03/24/2021  . Chronic pain 03/24/2021  . COVID-19 virus infection 11/01/2020  . Diarrhea 11/01/2020  . Dehydration 11/01/2020  . Generalized abdominal pain 10/09/2018  . Midline low back pain 09/11/2018  . Malfunction of intrathecal infusion pump 09/11/2018  . ANCA-associated vasculitis ([place]) 05/31/2018  . Steroid-induced hyperglycemia 05/11/2018  . Weakness 05/02/2018  . Morbid obesity ([place]) 01/25/2018   . Neuropathy 01/25/2018  . Presence of intrathecal pump 08/02/2017  . Rectocele 03/09/2017  . Urinary hesitancy 03/09/2017  . Abnormal finding on imaging 12/19/2016  . Esophageal dysphagia 12/19/2016  . Current chronic use of systemic steroids 10/26/2016  . IFG (impaired fasting glucose) 10/26/2016  . Arthralgia of multiple joints 07/15/2016  . Headache 07/15/2016  . Maculopapular rash, generalized 07/15/2016  . Peripheral neuropathy 07/04/2016  . Rash 07/04/2016  . CKD (chronic kidney disease) stage 3, GFR 30-59 ml/min (HCC) 07/04/2016  . 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 Neuropathic pain of both feet 01/11/2016  . OSA (obstructive sleep apnea) 12/29/2015  . Chronic insomnia 12/29/2015  . Mixed hyperlipidemia 12/29/2015  . Obesity, Class II, BMI 35-39.9, with comorbidity 12/29/2015  . Polyneuropathy associated with underlying disease ([place]) 12/29/2015  . 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 ([place]) 12/29/2015  . Class 2 drug-induced obesity with serious comorbidity and body mass index (BMI) of 35.0 to 35.9 in adult 12/29/2015  . Other mechanical complication of implanted electronic neurostimulator of spinal cord electrode (lead), initial encounter ([name]) 08/04/2015  . Adjustment disorder with depressed mood  03/29/2015  . Hyperlipidemia 07/20/2014  . Mononeuritis 07/20/2014  . Obesity (BMI 30-39.9) 07/20/2014  . Essential hypertension 07/20/2014  . 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 Neurocardiogenic syncope 07/06/2014  . Normocytic anemia 06/08/2014  . POTS (postural orthostatic tachycardia syndrome) 06/08/2014  . Tachycardia 06/08/2014  . Hypotension 06/03/2014  . Anal fissure 09/16/2013  . Adrenal insufficiency ([place]) 05/30/2013  . Encounter for long-term (current) use of high-risk medication 05/30/2013  . Fall 05/30/2013  . Hemorrhage of rectum and anus 05/30/2013  . Hypokalemia 05/30/2013  . Leukocytosis  05/30/2013  . Other long term (current) drug therapy 05/30/2013  . Syncope and collapse 05/30/2013  . 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[place]) 05/30/2013  . 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 Wegner's disease (congenital syphilitic osteochondritis)   . Gastroesophageal reflux disease without esophagitis 03/07/2012  . Other diseases of trachea and bronchus 08/30/2011  . 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 Atrophy of nasal turbinates 07/25/2011  . Nasal septal perforation 07/25/2011  . Cough 03/24/2011  . Limited granulomatosis with polyangiitis ([place]) 03/24/2011  . 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

## 2021-03-30 ENCOUNTER — Encounter (HOSPITAL_BASED_OUTPATIENT_CLINIC_OR_DEPARTMENT_OTHER): Payer: BC Managed Care – PPO | Admitting: Physician Assistant

## 2021-03-30 LAB — GLUCOSE, CAPILLARY
Glucose-Capillary: 96 mg/dL (ref 70–99)
Glucose-Capillary: 112 mg/dL — ABNORMAL HIGH (ref 70–99)
Glucose-Capillary: 144 mg/dL — ABNORMAL HIGH (ref 70–99)

## 2021-03-30 LAB — AEROBIC/ANAEROBIC CULTURE W GRAM STAIN (SURGICAL/DEEP WOUND): Gram Stain: NONE SEEN

## 2021-03-30 LAB — CK: Total CK: 40 U/L (ref 38–234)

## 2021-03-30 MED ORDER — SODIUM CHLORIDE 0.9 % IV SOLN
8.0000 mg/kg | Freq: Once | INTRAVENOUS | Status: AC
  Administered 2021-03-30: 600 mg via INTRAVENOUS
  Filled 2021-03-30: qty 12

## 2021-03-30 MED ORDER — RISAQUAD PO CAPS: 2.0000 | ORAL_CAPSULE | Freq: Three times a day (TID) | ORAL | 0 refills | Status: AC

## 2021-03-30 MED ORDER — DAPTOMYCIN IV (FOR PTA / DISCHARGE USE ONLY): 600.0000 mg | INTRAVENOUS | 0 refills | Status: AC

## 2021-03-30 NOTE — TOC Transition Note (Signed)
Transition of Care [facility]) - CM/SW Discharge Note   Patient Details  Name: [name] MRN: [id number] Date of Birth: 06-14-1966  Transition of Care [facility]) CM/SW Contact:  Kermit Balo, RN Phone Number: 03/30/2021, 11:21 AM   Clinical Narrative:    Patient is discharging home today with IV antibiotics through [facility]. [facility] to provide [facility] RN. Pt will be out of town June 18-25th and medication adjustments are being made for this. 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  Pt to be connected to her ampicillin prior to d/c home. Spouse to provide transport home later today.    Final next level of care: Home w Home Health Services Barriers to Discharge: Continued Medical Work up   Patient Goals and CMS Choice   CMS Medicare.gov Compare Post Acute Care list provided to:: Patient Choice offered to / list presented to : Patient  Discharge Placement                       Discharge Plan and Services                          HH Arranged: RN [facility] Agency: [facility] Date [facility] Agency Contacted: 03/29/21   Representative spoke with at [facility] Agency: Cory/ Pam  Social Determinants of Health (SDOH) Interventions     Readmission Risk Interventions No flowsheet data found.

## 2021-03-30 NOTE — Discharge Summary (Signed)
Physician Discharge Summary  [name] WGY:[id number] DOB: [birth year] DOA: 03/24/2021  PCP: Chesley Noon, MD  Admit date: 03/24/2021 Discharge date: 03/30/2021  Admitted From: Home  Discharge disposition: Home with [facility]  Recommendations for Outpatient Follow-Up:   Follow up with your primary care provider in one week.  Check CBC, BMP, magnesium in the next visit  OK to continue weight bearing in cam boot on right and post op shoe on left.  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  Diet recommendation: Low sodium, heart healthy.  Carbohydrate-modified.  Wound care: None.  Code status: Full.   History of Present Illness:   [name] is a 55 y.o. 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  She had been trying to get her early scheduled Rituxan infusion for treatment of her Wegener's granulomatosis 2 days ago when she was noted to be lethargic and acutely altered.  In the ED initial ABG showed pH of 7.2 , PCO2 49.9, and PCO2 76.8.  Symptoms were thought to be multifactorial in nature secondary to multiple high risk medications in addition to patient not using CPAP at night.  She was started on CPAP nightly with improvement in patient's mental status.  Urinalysis was concerning for infection and patient had reported some concerns  for dysuria.  She had been initially started on Rocephin IV.  During her initial evaluation she was also noted to have a left lateral foot stump and right second toe wound for which MRI was obtained.  MRI revealed osteomyelitis of the right second toe distal phalanx with nonspecific subcutaneous edema, and osteomyelitis of the left fifth metatarsal remaining base with adjacent ulcer with surrounding nonspecific edema.  Metronidazole was added to her medication regimen.  Dr. Doran Durand of orthopedics was consulted and patient was transferred to [facility] and [facility] for further evaluation and treatment.   Hospital Course:   Following conditions were addressed during hospitalization as listed below,  Acute on chronic osteomyelitis:  Patient was found to have osteomyelitis of the left fifth  metatarsal head as well as right second toe at the outside facility.  Patient was seen by orthopedics while in the hospital and underwent transmetatarsal amputation by orthopedics.  Bone culture with rare Candida and Enterococcus.  ID recommended fluconazole, daptomycin and ampicillin on discharge.    Home health will be arranged on discharge.   Urinary tract infection:  Completed course of antibiotic   Leukocytosis: Mild secondary to osteomyelitis.  Mild.  WBC of 11.9.   Diabetes mellitus type 2:  Latest hemoglobin A1c of 5.9.  . Will resume metformin on discharge.   Normocytic anemia:  No bleeding.  Likely anemia of chronic disease.   Latest hemoglobin of 9.1.  Will need outpatient monitoring   Wegener's granulomatosis, ANCA associated vasculitis: Patient is on Rituxan infusions, but was unable to get her last infusion scheduled on 5/31 due to  confusion.  Home regimen includes prednisone 7.5 mg daily, methotrexate, Rituxan infusions.  She is followed in the outpatient setting by  rheumatology at [facility].  Continue prednisone and folic acid.    Hypokalemia:  Resolved with replacement.  Latest potassium was 3.9.  Magnesium level of 2.0.   Chronic pain On Nucynta, gabapentin and Cymbalta.  Chronically on it.  We will continue with that   OSA:   Noncompliance.   Will resume CPAP at home with home health services.  GERD On Pepcid   Mild diarrhea, epigastric discomfort.  Has chronic diarrhea.  Advised outpatient follow-up.  Dizziness, hypotension postural.  Resolved at this time.   Obesity: BMI 32.59 kg/m.  Would benefit from lifestyle modification and weight loss on a ongoing basis.  Disposition.  At this time, patient is stable for disposition home with outpatient follow-up.  Follow-up with orthopedics and PCP.  Medical Consultants:   Orthopedics  Procedures:    Right Achilles tendon percutaneous lengthening, right foot trans-metatarsal amputation, left fifth metatarsal ray amputation on 03/24/2021   Subjective:   Today, patient was seen and examined at bedside.  Denies any nausea, vomiting, fever, chills or rigor.  Discharge Exam:   Vitals:   03/30/21 0411 03/30/21 0724  BP: (!) 86/58 117/73  Pulse: 63 65  Resp: 17 18  Temp: (!) 97.5 F (36.4 C) 98.2 F (36.8 C)  SpO2: 99% 94%   Vitals:   03/29/21 1938 03/29/21 2356 03/30/21 0411 03/30/21 0724  BP: 106/76 99/64 (!) 86/58 117/73  Pulse: 63 67 63 65  Resp: $Remo'18 19 17 18  'NjnaM$ Temp: 99.2 F (37.3 C) 99 F (37.2 C) (!) 97.5 F (36.4 C) 98.2 F (36.8 C)  TempSrc: Oral Oral Oral Oral  SpO2: 92% 95% 99% 94%  Weight:      Height:        General: Alert awake, not in obvious distress, obese HENT: pupils equally reacting to light,  No scleral pallor or icterus noted. Oral mucosa is moist.  Chest:  Clear breath sounds.  Diminished breath sounds bilaterally. No crackles or wheezes.  CVS: S1 &S2 heard. No murmur.  Regular rate and rhythm. Abdomen: Soft, nontender, nondistended.  Bowel sounds are heard.   Extremities:  Bilateral foot wrapped. Psych: Alert, awake and oriented, normal mood CNS:  No cranial nerve deficits.  Power equal  in all extremities.   Skin: Warm and dry.  Bilateral foot on wraps  The results of significant diagnostics from this hospitalization (including imaging, microbiology, ancillary and laboratory) are listed below for reference.     Diagnostic Studies:   No results found.   Labs:   Basic Metabolic Panel: Recent Labs  Lab 03/24/21 1207 03/25/21 0058 03/26/21 0254 03/27/21 0133 03/28/21 0511 03/29/21 0507  NA 140 141 140 141 142  --   K 3.1* 3.7 4.1 3.8 3.9  --   CL 115* 115* 111 111 112*  --   CO2 21* $Remov'23 24 27 26  'RMguRm$ --   GLUCOSE 162* 109* 91 100* 88  --   BUN 7 6 5* 7 9  --   CREATININE 0.97 0.86 0.92 0.85 1.01*  --   CALCIUM 7.9* 8.1* 7.6* 8.0* 8.4*  --   MG  --   --  2.1  --  2.0 2.2  PHOS  --   --  2.5  --   --   --    GFR Estimated Creatinine Clearance: 71.7 mL/min (A) (by C-G formula based on  SCr of 1.01 mg/dL (H)). Liver Function Tests: Recent Labs  Lab 03/24/21 1207 03/26/21 0254  AST 22 18  ALT 16 14  ALKPHOS 60 61  BILITOT 0.7 0.5  PROT 4.9* 4.8*  ALBUMIN 2.8* 2.6*   No results for input(s): LIPASE, AMYLASE in the last 168 hours. No results for input(s): AMMONIA in the last 168 hours. Coagulation profile No results for input(s): INR, PROTIME in the last 168 hours.  CBC: Recent Labs  Lab 03/24/21 1207 03/25/21 0058 03/26/21 0254 03/27/21 0133 03/28/21 0511 03/29/21 0507  WBC 12.1* 11.3* 12.2* 10.1 9.5 11.9*  NEUTROABS 8.7*  --   --   --   --   --   HGB 9.1* 8.2* 8.1* 8.5* 8.6* 9.1*  HCT 30.1* 27.1* 27.4* 28.7* 29.2* 31.3*  MCV 88.3 88.6 90.4 89.1 88.5 88.9  PLT 337 312 321 314 329 341   Cardiac Enzymes: No results for input(s): CKTOTAL, CKMB, CKMBINDEX, TROPONINI in the last 168 hours. BNP: Invalid input(s): POCBNP CBG: Recent Labs  Lab 03/29/21 0647 03/29/21 1140 03/29/21 1633 03/29/21 2105 03/30/21 0619  GLUCAP 95 121* 150* 135* 96   D-Dimer No results for input(s): DDIMER in the last 72 hours. Hgb A1c No results for input(s):  HGBA1C in the last 72 hours. Lipid Profile No results for input(s): CHOL, HDL, LDLCALC, TRIG, CHOLHDL, LDLDIRECT in the last 72 hours. Thyroid function studies No results for input(s): TSH, T4TOTAL, T3FREE, THYROIDAB in the last 72 hours.  Invalid input(s): FREET3 Anemia work up No results for input(s): VITAMINB12, FOLATE, FERRITIN, TIBC, IRON, RETICCTPCT in the last 72 hours. Microbiology Recent Results (from the past 240 hour(s))  SARS CORONAVIRUS 2 (TAT 6-24 HRS) Nasopharyngeal Nasopharyngeal Swab     Status: None   Collection Time: 03/24/21 12:37 PM   Specimen: Nasopharyngeal Swab  Result Value Ref Range Status   SARS Coronavirus 2 NEGATIVE NEGATIVE Final    Comment: (NOTE) SARS-CoV-2 target nucleic acids are NOT DETECTED.  The SARS-CoV-2 RNA is generally detectable in upper and lower respiratory specimens during the acute phase of infection. Negative results do not preclude SARS-CoV-2 infection, do not rule out co-infections with other pathogens, and should not be used as the sole basis for treatment or other patient management decisions. Negative results must be combined with clinical observations, patient history, and epidemiological information. The expected result is Negative.  Fact Sheet for Patients: SugarRoll.be  Fact Sheet for Healthcare Providers: https://www.woods-mathews.com/  This test is not yet approved or cleared by the [place] FDA and  has been authorized for detection and/or diagnosis of SARS-CoV-2 by FDA under an Emergency Use Authorization (EUA). This EUA will remain  in effect (meaning this test can be used) for the duration of the COVID-19 declaration under Se ction 564(b)(1) of the Act, 21 U.S.C. section 360bbb-3(b)(1), unless the authorization is terminated or revoked sooner.  Performed at [facility], [place] [address] [place], [place] [address]   Surgical pcr screen     Status: None    Collection Time: 03/24/21  4:23 PM   Specimen: Nasal Mucosa; Nasal Swab  Result Value Ref Range Status   MRSA, PCR NEGATIVE NEGATIVE Final   Staphylococcus aureus NEGATIVE NEGATIVE Final    Comment: (NOTE) The Xpert SA Assay (FDA approved for NASAL specimens in patients 59 years of age and older), is one component of a comprehensive surveillance program. It is not intended to diagnose infection nor to guide or monitor treatment. Performed at [facility], [address] [place], [place] [address]   Aerobic/Anaerobic Culture w Gram Stain (surgical/deep wound)     Status: None (Preliminary result)   Collection Time: 03/24/21  4:43 PM   Specimen: Soft Tissue, Other  Result Value Ref Range Status   Specimen Description TISSUE LEFT FOOT  Final   Special Requests DEEP CULTURE SWAB  Final   Gram Stain   Final    NO WBC SEEN NO ORGANISMS SEEN Performed at [facility], [place] [address] [place], [place] [address]    Culture   Final    RARE CANDIDA ALBICANS RARE ENTEROCOCCUS FAECALIS NO ANAEROBES ISOLATED; CULTURE IN PROGRESS FOR 5 DAYS    Report Status PENDING  Incomplete   Organism ID, Bacteria ENTEROCOCCUS FAECALIS  Final      Susceptibility   Enterococcus faecalis - MIC*    AMPICILLIN <=2 SENSITIVE Sensitive     VANCOMYCIN 1 SENSITIVE Sensitive     * RARE ENTEROCOCCUS FAECALIS     Discharge Instructions:   Discharge Instructions     [facility] pharmacist to adjust dose for Vancomycin, Aminoglycosides and other anti-infective therapies as requested by physician.   Complete by: As directed    [facility] pharmacist to adjust dose for Vancomycin, Aminoglycosides and other anti-infective therapies as requested by physician.   Complete by: As directed    [facility] to provide Cath Flo 2mg    Complete by: As directed    Administer for PICC line occlusion and as ordered by physician for other access device issues.   [facility] to provide Cath Flo 2mg    Complete by: As directed    Administer for PICC line occlusion and as ordered by physician for other access device issues.   Anaphylaxis Kit: Provided to treat any anaphylactic reaction to the medication being provided to the patient if First Dose or when requested by physician   Complete by: As directed    Epinephrine 1mg /ml vial / amp: Administer 0.3mg  (0.23ml) subcutaneously once for moderate to severe anaphylaxis, nurse to call physician and pharmacy when reaction occurs and call 911 if needed for immediate care   Diphenhydramine 50mg /ml IV vial: Administer 25-50mg  IV/IM PRN for first dose reaction, rash, itching, mild reaction, nurse to call physician and pharmacy when reaction occurs   Sodium Chloride 0.9% NS 537ml IV: Administer if needed for hypovolemic blood pressure drop or as ordered by physician after call to physician with anaphylactic reaction   Anaphylaxis Kit: Provided to treat any anaphylactic reaction to the medication being provided to the patient if First Dose or when requested by physician   Complete by: As directed    Epinephrine 1mg /ml vial / amp: Administer 0.3mg  (0.38ml) subcutaneously once for moderate to severe anaphylaxis, nurse to call physician and pharmacy when reaction occurs and call 911 if needed for immediate care   Diphenhydramine 50mg /ml IV vial: Administer 25-50mg  IV/IM PRN for first dose reaction, rash, itching, mild reaction, nurse to call physician and pharmacy when reaction occurs   Sodium Chloride 0.9% NS 564ml IV: Administer if needed for hypovolemic blood pressure drop or as ordered by physician after call to physician with anaphylactic reaction   Change dressing on IV access line weekly and PRN   Complete by: As directed    Change dressing on IV access line weekly and PRN   Complete by: As directed    Diet - low sodium heart healthy   Complete by: As directed  Diet Carb Modified   Complete by: As directed     Discharge instructions   Complete by: As directed    Continue antibiotics as per the home health.  Follow-up with orthopedics for surgical follow-up in 2 weeks.   Discharge wound care:   Complete by: As directed    Reinforce dressing   Flush IV access with Sodium Chloride 0.9% and Heparin 10 units/ml or 100 units/ml   Complete by: As directed    Flush IV access with Sodium Chloride 0.9% and Heparin 10 units/ml or 100 units/ml   Complete by: As directed    Home infusion instructions - [facility]   Complete by: As directed    Instructions: Flush IV access with Sodium Chloride 0.9% and Heparin 10units/ml or 100units/ml   Change dressing on IV access line: Weekly and PRN   Instructions Cath Flo $Remove'2mg'VPDPTkw$ : Administer for PICC Line occlusion and as ordered by physician for other access device   [facility] pharmacist to adjust dose for: Vancomycin, Aminoglycosides and other anti-infective therapies as requested by physician   Home infusion instructions - [facility]   Complete by: As directed    Instructions: Flush IV access with Sodium Chloride 0.9% and Heparin 10units/ml or 100units/ml   Change dressing on IV access line: Weekly and PRN   Instructions Cath Flo $Remove'2mg'EyvANTY$ : Administer for PICC Line occlusion and as ordered by physician for other access device   [facility] pharmacist to adjust dose for: Vancomycin, Aminoglycosides and other anti-infective therapies as requested by physician   Increase activity slowly   Complete by: As directed    Method of administration may be changed at the discretion of home infusion pharmacist based upon assessment of the patient and/or caregiver's ability to self-administer the medication ordered   Complete by: As directed    Method of administration may be changed at the discretion of home infusion pharmacist based upon assessment of the patient and/or caregiver's ability to self-administer the medication ordered   Complete  by: As directed       Allergies as of 03/30/2021       Reactions   Ibuprofen Other (See Comments)   Pt is unable to take this due to kidney problems.          Medication List     TAKE these medications    acetaminophen 325 MG tablet Commonly known as: TYLENOL Take 2 tablets (650 mg total) by mouth every 6 (six) hours as needed for mild pain (or Fever >/= 101).   acidophilus Caps capsule Take 2 capsules by mouth 3 (three) times daily.   albuterol 108 (90 Base) MCG/ACT inhaler Commonly known as: VENTOLIN HFA Inhale 2 puffs into the lungs every 6 (six) hours as needed for wheezing or shortness of breath.   ampicillin  IVPB Inject 12 g into the vein daily. As a continuous infusion. Indication:  Left foot osteomyelitis  First Dose: Yes Last Day of Therapy:  05/08/21 Labs - Once weekly:  CBC/D and BMP, Labs - Every other week:  ESR and CRP Method of administration: Ambulatory Pump (Continuous Infusion) Method of administration may be changed at the discretion of home infusion pharmacist based upon assessment of the patient and/or caregiver's ability to self-administer the medication ordered.   b complex vitamins tablet Take 1 tablet by mouth daily.   CALCIUM CARBONATE-VITAMIN D3 PO Take 1 tablet by mouth daily.   daptomycin  IVPB Commonly known as: CUBICIN Inject 600 mg into  the vein daily for 7 days. Indication: Left foot osteomyelitis  First Dose: Yes Last Day of Therapy:  To be used while patient at the beach 04/09/21- 04/16/21. Otherwise use ampicillin Labs - Once weekly:  CBC/D, BMP, and CPK Labs - Every other week:  ESR and CRP Method of administration: IV Push Method of administration may be changed at the discretion of home infusion pharmacist based upon assessment of the patient and/or caregiver's ability to self-administer the medication ordered. Start taking on: April 09, 2021   docusate sodium 100 MG capsule Commonly known as: Colace Take 1 capsule (100 mg  total) by mouth 2 (two) times daily. While taking narcotic pain medicine.   DULoxetine 60 MG capsule Commonly known as: CYMBALTA Take 60 mg by mouth 2 (two) times daily.   famotidine 20 MG tablet Commonly known as: PEPCID Take 20 mg by mouth daily.   fluconazole 200 MG tablet Commonly known as: DIFLUCAN Take 2 tablets (400 mg total) by mouth daily. Treat through 05/08/21.   folic acid 1 MG tablet Commonly known as: FOLVITE Take 2 mg by mouth daily.   furosemide 40 MG tablet Commonly known as: LASIX Take 1 tablet (40 mg total) by mouth as directed. What changed: when to take this   gabapentin 300 MG capsule Commonly known as: NEURONTIN Take 600-900 mg by mouth See admin instructions. Take 3 capsules (900 mg total) by mouth in the morning, 2 capsules (600 mg total) in the early afternoon, and 4 capsules (1200 mg total) in the evening.   K-DUR PO Take 40 mEq by mouth every other day.   lidocaine 5 % Commonly known as: LIDODERM Place 1 patch onto the skin daily as needed (for pain). Remove & Discard patch within 12 hours or as directed by MD   magnesium oxide 400 MG tablet Commonly known as: MAG-OX Take 400 mg by mouth 2 (two) times daily.   METHOTREXATE SODIUM IJ Inject 25 mg into the skin every Sunday.   montelukast 10 MG tablet Commonly known as: SINGULAIR Take 10 mg by mouth daily.   Nucynta 75 MG tablet Generic drug: tapentadol HCl Take 75 mg by mouth 4 (four) times daily.   predniSONE 5 MG tablet Commonly known as: DELTASONE Take 7.5 mg by mouth daily.   senna 8.6 MG Tabs tablet Commonly known as: SENOKOT Take 2 tablets (17.2 mg total) by mouth 2 (two) times daily.   tizanidine 2 MG capsule Commonly known as: ZANAFLEX Take 2 mg by mouth 3 (three) times daily as needed for muscle spasms.   topiramate 100 MG tablet Commonly known as: TOPAMAX Take 100 mg by mouth daily.   traZODone 100 MG tablet Commonly known as: DESYREL Take 1 tablet (100 mg total)  by mouth at bedtime.   vitamin B-12 1000 MCG tablet Commonly known as: CYANOCOBALAMIN Take 1,000 mcg by mouth daily.   Vitamin D (Ergocalciferol) 1.25 MG (50000 UNIT) Caps capsule Commonly known as: DRISDOL Take 50,000 Units by mouth every Friday.       ASK your doctor about these medications    oxyCODONE 5 MG immediate release tablet Commonly known as: Roxicodone Take 1 tablet (5 mg total) by mouth every 4 (four) hours as needed for up to 3 days for moderate pain or severe pain. Ask about: Should I take this medication?               Discharge Care Instructions  (From admission, onward)  Start     Ordered   03/30/21 0000  Discharge wound care:       Comments: Reinforce dressing   03/30/21 0955   03/30/21 0000  Change dressing on IV access line weekly and PRN  (Home infusion instructions - [facility] )        03/30/21 0845   03/29/21 0000  Change dressing on IV access line weekly and PRN  (Home infusion instructions - [facility] )        03/29/21 1508            Follow-up Information     Wylene Simmer, MD. Schedule an appointment as soon as possible for a visit in 2 week(s).   Specialty: Orthopedic Surgery Contact information: [address] [place] [address] [phone]         [facility], [facility] Follow up.   Specialty: [place] Why: the home health agency will contact you for the first home visit Contact information: [place] [address] [phone]                  Time coordinating discharge: 39 minutes  Signed:  Andrea Ferrer  [facility] 03/30/2021, 9:55 AM

## 2021-04-04 ENCOUNTER — Encounter: Payer: Self-pay | Admitting: Family Medicine

## 2021-04-20 ENCOUNTER — Telehealth: Payer: Self-pay

## 2021-04-20 NOTE — Telephone Encounter (Signed)
[name] from [facility] called to report incident reported to them by patient. Patient D/C Ampicillin for 9 days and [facility] sent her Daptomycin to take while on vacation(beach)  for  9 days. Monday PM she started back on the Ampicillin and experienced 101 fever later in the evening as well as dizziness and clammy skin. She felt faint. Temp went down to 99.0 yesterday and today is WNL. Patient also had diarrhea yesterday. All symptoms have gone away today but [facility] wanted to report this and if this is more than a reaction to the switch of medications, we will call them back or reach out to patient.

## 2021-04-22 ENCOUNTER — Other Ambulatory Visit: Payer: Self-pay

## 2021-04-22 ENCOUNTER — Ambulatory Visit (INDEPENDENT_AMBULATORY_CARE_PROVIDER_SITE_OTHER): Payer: BC Managed Care – PPO | Admitting: Internal Medicine

## 2021-04-22 ENCOUNTER — Encounter: Payer: Self-pay | Admitting: Internal Medicine

## 2021-04-22 VITALS — BP 90/73 | HR 73 | Temp 98.0°F

## 2021-04-22 DIAGNOSIS — Z452 Encounter for adjustment and management of vascular access device: Secondary | ICD-10-CM | POA: Diagnosis not present

## 2021-04-22 DIAGNOSIS — M869 Osteomyelitis, unspecified: Secondary | ICD-10-CM

## 2021-04-22 DIAGNOSIS — R509 Fever, unspecified: Secondary | ICD-10-CM

## 2021-04-22 MED ORDER — AMOXICILLIN 500 MG PO CAPS: 500.0000 mg | ORAL_CAPSULE | Freq: Three times a day (TID) | ORAL | 0 refills | Status: DC

## 2021-04-22 NOTE — Assessment & Plan Note (Signed)
Bilateral osteomyelitis with concern for residual infection on the left foot.  Her inflammatory markers are normal, her wound is healing well and nearly closed and no concerns.  With her current fever and erythema at the picc line, I will transition her to oral therapy.  She is scheduled to continue antibiotics through 7/17 and will have her continue with amoxicillin orally until then at this point. She will follow up with me in 2 weeks.

## 2021-04-22 NOTE — Assessment & Plan Note (Signed)
I am concerned with the picc line issue with some erythema so I will have the picc line removed today and transition her to oral amoxicillin with the fluconazole to go through 7/17.  If fever persists despite line removal, she will seek care in the ED over the weekend or call on Tuesday for reevaluation.  No other localizing symptoms.

## 2021-04-22 NOTE — Progress Notes (Signed)
   Subjective:    Patient ID: [name], female    DOB: Mar 11, 1966, 55 y.o.   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  Underwent right TMA and left 5th metatarsal amputation and after discussion with Dr. Doran Durand, it is not certain if all the infected bone removed on the left and based on cultures which grew pansensitive Enterococcus and C albicans.  She was placed on ampicillin and oral fluconazole and here for follow up.  She did get daptomycin during a trip to the beach and back on ampicillin about 1 week.  Since starting, she has noted fever, chills and weakness with fever up to 101, nearly daily.  Some erythema noted on the picc line.  Her most recent ESR and CRP both wnl. She has no rash, no new/worsening diarrhea.     Review of Systems  Constitutional:  Positive for activity change, appetite change, chills, fatigue and fever.  Gastrointestinal:  Negative for diarrhea and nausea.      Objective:   Physical Exam Eyes:     General: No scleral icterus. Cardiovascular:     Rate and Rhythm: Normal rate and regular rhythm.  Pulmonary:     Effort: Pulmonary effort is normal.  Neurological:     Mental Status: She is alert.  Psychiatric:        Mood and Affect: Mood normal.    SH: no tobacco      Assessment & Plan:

## 2021-04-22 NOTE — Progress Notes (Signed)
Per verbal order from Dr Luciana Axe, 38cm cm Single Lumen Peripherally Inserted Central Catheter removed from right basilic, tip intact. No sutures present. RN confirmed length per chart. Dressing was clean and dry. Petroleum dressing applied. Pt advised no heavy lifting with this arm, leave dressing for 24 hours and call the office or seek emergent care if dressing becomes soaked with blood or swelling or sharp pain presents. Patient verbalized understanding and agreement.  Patient's questions answered to their satisfaction. Patient tolerated procedure well, RN walked patient to check out. Pharmacy notified.  Adeja Sarratt Loyola Mast, RN

## 2021-04-22 NOTE — Assessment & Plan Note (Signed)
As mentioned above, with fever and chills will go ahead and remove the picc line, which was done here in clinic.

## 2021-05-10 ENCOUNTER — Encounter: Payer: Self-pay | Admitting: Internal Medicine

## 2021-05-10 ENCOUNTER — Other Ambulatory Visit: Payer: Self-pay

## 2021-05-10 ENCOUNTER — Ambulatory Visit (INDEPENDENT_AMBULATORY_CARE_PROVIDER_SITE_OTHER): Payer: BC Managed Care – PPO | Admitting: Internal Medicine

## 2021-05-10 VITALS — BP 95/61 | HR 77 | Temp 98.1°F

## 2021-05-10 DIAGNOSIS — R509 Fever, unspecified: Secondary | ICD-10-CM

## 2021-05-10 DIAGNOSIS — M869 Osteomyelitis, unspecified: Secondary | ICD-10-CM

## 2021-05-10 DIAGNOSIS — Z5181 Encounter for therapeutic drug level monitoring: Secondary | ICD-10-CM | POA: Diagnosis not present

## 2021-05-10 NOTE — Assessment & Plan Note (Signed)
Well-healed and clinically no concerns.  I will check inflammatory markers as well and if reassuring, will not need follow up.

## 2021-05-10 NOTE — Assessment & Plan Note (Signed)
No measured fever now with removal of the picc line.  Some night sweats but no significant concerns, no weight loss.

## 2021-05-10 NOTE — Progress Notes (Signed)
   Subjective:    Patient ID: [name], female    DOB: 1966/07/15, 55 y.o.   MRN: [id number]  HPI Here for follow up of osteomyelitis. She required right TMA and left 5th metatarsal amputation with concern for residual infection of the left 5th metatarsal, managed by Dr. Victorino Dike.  Culture with Enterococcus and C albicans and was on ampicillin and oral fluconazole.  She was having issues with fever so last visit I transitioned her to oral amoxicillin and she completed that and fluconazole 2 days ago.  She is having some night sweats but has not measured any fever.  Right foot continues to hurt.  She complained of some perioral rash after starting the amoxicillin.     Review of Systems  Constitutional:  Negative for chills, fever and unexpected weight change.       Sweats some nights  Gastrointestinal:  Negative for diarrhea and nausea.  Skin:  Negative for rash.      Objective:   Physical Exam Eyes:     General: No scleral icterus. Musculoskeletal:     Comments: Left foot with closed incision area, no warmth, no erythema; small area of dried scab at the site of the 5th metatarsal  Right foot with closed incision area, no warmth, no erythema  Neurological:     General: No focal deficit present.     Mental Status: She is alert.  Psychiatric:        Mood and Affect: Mood normal.   SH: no tobacco       Assessment & Plan:

## 2021-05-10 NOTE — Assessment & Plan Note (Signed)
I will check a cmp and CBC on her antibiotics

## 2021-05-11 ENCOUNTER — Emergency Department (HOSPITAL_COMMUNITY): Payer: BC Managed Care – PPO

## 2021-05-11 ENCOUNTER — Emergency Department (HOSPITAL_COMMUNITY)
Admission: EM | Admit: 2021-05-11 | Discharge: 2021-05-11 | Disposition: A | Discharge status: Home / Self Care | Payer: BC Managed Care – PPO | Attending: Emergency Medicine | Admitting: Emergency Medicine

## 2021-05-11 DIAGNOSIS — N183 Chronic kidney disease, stage 3 unspecified: Secondary | ICD-10-CM | POA: Diagnosis not present

## 2021-05-11 DIAGNOSIS — E039 Hypothyroidism, unspecified: Secondary | ICD-10-CM | POA: Diagnosis not present

## 2021-05-11 DIAGNOSIS — M25552 Pain in left hip: Secondary | ICD-10-CM | POA: Diagnosis present

## 2021-05-11 DIAGNOSIS — Z89422 Acquired absence of other left toe(s): Secondary | ICD-10-CM | POA: Diagnosis not present

## 2021-05-11 DIAGNOSIS — M1610 Unilateral primary osteoarthritis, unspecified hip: Secondary | ICD-10-CM

## 2021-05-11 DIAGNOSIS — I129 Hypertensive chronic kidney disease with stage 1 through stage 4 chronic kidney disease, or unspecified chronic kidney disease: Secondary | ICD-10-CM | POA: Diagnosis not present

## 2021-05-11 DIAGNOSIS — Z89421 Acquired absence of other right toe(s): Secondary | ICD-10-CM | POA: Insufficient documentation

## 2021-05-11 DIAGNOSIS — Z8616 Personal history of COVID-19: Secondary | ICD-10-CM | POA: Insufficient documentation

## 2021-05-11 LAB — CBC WITH DIFFERENTIAL/PLATELET
Absolute Monocytes: 719 cells/uL (ref 200–950)
Basophils Absolute: 46 cells/uL (ref 0–200)
Basophils Relative: 0.5 %
Eosinophils Absolute: 346 cells/uL (ref 15–500)
Eosinophils Relative: 3.8 %
HCT: 29 % — ABNORMAL LOW (ref 35.0–45.0)
Hemoglobin: 8.8 g/dL — ABNORMAL LOW (ref 11.7–15.5)
Lymphs Abs: 3649 cells/uL (ref 850–3900)
MCH: 23.9 pg — ABNORMAL LOW (ref 27.0–33.0)
MCHC: 30.3 g/dL — ABNORMAL LOW (ref 32.0–36.0)
MCV: 78.8 fL — ABNORMAL LOW (ref 80.0–100.0)
MPV: 9.9 fL (ref 7.5–12.5)
Monocytes Relative: 7.9 %
Neutro Abs: 4341 cells/uL (ref 1500–7800)
Neutrophils Relative %: 47.7 %
Platelets: 327 10*3/uL (ref 140–400)
RBC: 3.68 10*6/uL — ABNORMAL LOW (ref 3.80–5.10)
RDW: 16.3 % — ABNORMAL HIGH (ref 11.0–15.0)
Total Lymphocyte: 40.1 %
WBC: 9.1 10*3/uL (ref 3.8–10.8)

## 2021-05-11 LAB — COMPREHENSIVE METABOLIC PANEL
AG Ratio: 1.7 (calc) (ref 1.0–2.5)
ALT: 11 U/L (ref 6–29)
AST: 18 U/L (ref 10–35)
Albumin: 3.4 g/dL — ABNORMAL LOW (ref 3.6–5.1)
Alkaline phosphatase (APISO): 63 U/L (ref 37–153)
BUN: 9 mg/dL (ref 7–25)
CO2: 23 mmol/L (ref 20–32)
Calcium: 8.3 mg/dL — ABNORMAL LOW (ref 8.6–10.4)
Chloride: 112 mmol/L — ABNORMAL HIGH (ref 98–110)
Creat: 1.03 mg/dL (ref 0.50–1.03)
Globulin: 2 g/dL (calc) (ref 1.9–3.7)
Glucose, Bld: 95 mg/dL (ref 65–99)
Potassium: 4.4 mmol/L (ref 3.5–5.3)
Sodium: 142 mmol/L (ref 135–146)
Total Bilirubin: 0.2 mg/dL (ref 0.2–1.2)
Total Protein: 5.4 g/dL — ABNORMAL LOW (ref 6.1–8.1)

## 2021-05-11 LAB — SEDIMENTATION RATE: Sed Rate: 22 mm/h (ref 0–30)

## 2021-05-11 MED ORDER — OXYCODONE-ACETAMINOPHEN 5-325 MG PO TABS: 1.0000 | ORAL_TABLET | Freq: Four times a day (QID) | ORAL | 0 refills | Status: DC | PRN

## 2021-05-11 MED ORDER — PREDNISONE 20 MG PO TABS: 20.0000 mg | ORAL_TABLET | Freq: Two times a day (BID) | ORAL | 0 refills | Status: DC

## 2021-05-11 MED ORDER — OXYCODONE-ACETAMINOPHEN 5-325 MG PO TABS
1.0000 | ORAL_TABLET | Freq: Once | ORAL | Status: AC
Start: 2021-05-11 — End: 2021-05-11
  Administered 2021-05-11: 1 via ORAL
  Filled 2021-05-11: qty 1

## 2021-05-11 NOTE — ED Notes (Signed)
Ambulated pt with help from RN in room and a walker

## 2021-05-11 NOTE — ED Notes (Signed)
Pt was able to ambulate approx. 10 feet total. Gait was steady but she states she continues to have 10/10 pain when ambulated. Reports that her pain feels like something is pulling from left hip to knee.

## 2021-05-11 NOTE — ED Triage Notes (Addendum)
Rcems for severe left hip and leg pain started this morning. Denies falls or injury. Husband wouldn't let her take and pain meds.   Pt BP is always soft   Had all toes removed in June r/t osteomyelitis.

## 2021-05-11 NOTE — ED Notes (Signed)
Took pt to the bathroom via wheelchair , took pt back to room and help back to bed

## 2021-05-11 NOTE — Discharge Instructions (Addendum)
We are giving you a prescription for prednisone, to use for short period of time to treat the inflammation from a suspected arthritis of your left hip.  This can cause your blood sugar to rise.  Since your diabetes, watch your carbohydrate intake very closely and drink plenty of water to help keep your blood sugar level.  See your medical doctor for a blood sugar check in 1 or 2 weeks.

## 2021-05-11 NOTE — ED Notes (Signed)
Tried to ambulate pt with a walker pt  stated she could not at this time .

## 2021-05-11 NOTE — ED Notes (Signed)
Changed out for a new urine canister and purewick

## 2021-05-11 NOTE — ED Provider Notes (Signed)
[facility] EMERGENCY DEPARTMENT Provider Note   CSN: [id number] Arrival date & time: 05/11/21  2595     History No chief complaint on file.   [name] is a 55 y.o. female.  HPI She presents for evaluation of left hip pain which started suddenly today without trauma.  She states the pain is in her left hip and left upper leg region.  She denies fever, chills, nausea or vomiting.  She is recovering from osteomyelitis of her foot, which has been successfully treated.  She saw her ID doctor yesterday and is off antibiotics at this time.  She does not try to take anything for the pain this morning.  She denies fever, chills, nausea or vomiting.  There are no other known active modifying factors.    Past Medical History:  Diagnosis Date   Chronic cough    Chronic pain    Diabetes mellitus without complication (HCC)    Dyspnea    GERD (gastroesophageal reflux disease)    Hypokalemia    Hypothyroidism    Neurocardiogenic syncope    OSA (obstructive sleep apnea)    does not use CPAP   Osteomyelitis (HCC)    bilateral feet   Peripheral neuropathy    Presence of intrathecal pump    recieves Prialt/bupivicaine   Wegener's granulomatosis 2009   Wegner's disease (congenital syphilitic osteochondritis)     Patient Active Problem List   Diagnosis Date Noted   Medication monitoring encounter 05/10/2021   Fever and chills 04/22/2021   Osteomyelitis (HCC) 03/24/2021   Chronic pain 03/24/2021   COVID-19 virus infection 11/01/2020   Diarrhea 11/01/2020   Dehydration 11/01/2020   Generalized abdominal pain 10/09/2018   Midline low back pain 09/11/2018   Malfunction of intrathecal infusion pump 09/11/2018   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   Steroid-induced hyperglycemia 05/11/2018   Weakness 05/02/2018   Morbid obesity (HCC) 01/25/2018   Neuropathy 01/25/2018   Presence of intrathecal pump 08/02/2017   Rectocele 03/09/2017   Urinary hesitancy 03/09/2017    Abnormal finding on imaging 12/19/2016   Esophageal dysphagia 12/19/2016   Current chronic use of systemic steroids 10/26/2016   IFG (impaired fasting glucose) 10/26/2016   Arthralgia of multiple joints 07/15/2016   Headache 07/15/2016   Maculopapular rash, generalized 07/15/2016   Peripheral neuropathy 07/04/2016   Rash 07/04/2016   CKD (chronic kidney disease) stage 3, GFR 30-59 ml/min (HCC) 07/04/2016   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   Neuropathic pain of both feet 01/11/2016   OSA (obstructive sleep apnea) 12/29/2015   Chronic insomnia 12/29/2015   Mixed hyperlipidemia 12/29/2015   Obesity, Class II, BMI 35-39.9, with comorbidity 12/29/2015   Polyneuropathy associated with underlying disease (HCC) 12/29/2015   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) 12/29/2015   Class 2 drug-induced obesity with serious comorbidity and body mass index (BMI) of 35.0 to 35.9 in adult 12/29/2015   Other mechanical complication of implanted electronic neurostimulator of spinal cord electrode (lead), initial encounter (HCC) 08/04/2015   Adjustment disorder with depressed mood 03/29/2015   Hyperlipidemia 07/20/2014   Mononeuritis 07/20/2014   Obesity (BMI 30-39.9) 07/20/2014   Essential hypertension 07/20/2014   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   Neurocardiogenic syncope 07/06/2014   Normocytic anemia 06/08/2014   POTS (postural orthostatic tachycardia syndrome) 06/08/2014   Hypotension 06/03/2014   Anal fissure 09/16/2013   Adrenal insufficiency (HCC) 05/30/2013   Encounter for long-term (current) use of high-risk medication 05/30/2013   Fall 05/30/2013   Hemorrhage  of rectum and anus 05/30/2013   Other long term (current) drug therapy 05/30/2013   Syncope and collapse 05/30/2013   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/30/2013   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/2013   Wegner's disease (congenital syphilitic osteochondritis)     Gastroesophageal reflux disease without esophagitis 03/07/2012   Other diseases of trachea and bronchus 08/30/2011   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   Atrophy of nasal turbinates 07/25/2011   Nasal septal perforation 07/25/2011   Cough 03/24/2011   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   Subglottic stenosis 03/24/2011    Past Surgical History:  Procedure Laterality Date   AMPUTATION Right 10/21/2020   Procedure: Right hallux amputation;  Surgeon: Toni ArthursHewitt, John, MD;  Location: [facility];  Service: Orthopedics;  Laterality: Right;   AMPUTATION TOE Bilateral 08/12/2020   Procedure: Right 3rd toe amputation; left hallux and 3rd toe amputation;  Surgeon: Toni ArthursHewitt, John, MD;  Location: [facility];  Service: Orthopedics;  Laterality: Bilateral;  45min   AMPUTATION TOE Right 03/24/2021   Procedure: AMPUTATION TOE;  Surgeon: Toni ArthursHewitt, John, MD;  Location: [facility];  Service: Orthopedics;  Laterality: Right;   APPENDECTOMY     BACK SURGERY     CHOLECYSTECTOMY     COLONOSCOPY N/A 06/02/2013   Procedure: COLONOSCOPY;  Surgeon: Graylin ShiverSalem F Ganem, MD;  [name];  Service: Endoscopy;  Laterality: N/A;   HERNIA REPAIR  2000   INCISION AND DRAINAGE ABSCESS Left 07/07/2016   Procedure: DEBRIDMENT LEFT THIGH ABSCESS, EXISION ACUTE SKIN RASH LEFT THIGH(1CM LESION);  Surgeon: Claud KelpHaywood Ingram, MD;  Location: [facility];  Service: General;  Laterality: Left;   SEPTOPLASTY  02/2013   spleen anuyism     splenic aneurysm     TRANSMETATARSAL AMPUTATION Bilateral 10/21/2020   Procedure: Left transmetatarsal amputation;  Surgeon: Toni ArthursHewitt, John, MD;  Location: [facility];  Service: Orthopedics;  Laterality: Bilateral;   TRANSMETATARSAL AMPUTATION Left 03/24/2021   Procedure: TRANSMETATARSAL AMPUTATION;  Surgeon: Toni ArthursHewitt, John, MD;  [facility];  Service: Orthopedics;  Laterality: Left;   VAGINAL HYSTERECTOMY       OB History   No obstetric history on  file.     Family History  Problem Relation Age of Onset   Diabetes Mother    Heart disease Mother    Diabetes Father     Social History   Tobacco Use   Smoking status: Never   Smokeless tobacco: Never  Vaping Use   Vaping Use: Never used  Substance Use Topics   Alcohol use: Yes    Comment: occasionally   Drug use: No    Home Medications Prior to Admission medications   Medication Sig Start Date End Date Taking? Authorizing Provider  oxyCODONE-acetaminophen (PERCOCET/ROXICET) 5-325 MG tablet Take 1 tablet by mouth every 6 (six) hours as needed for severe pain. 05/11/21  Yes Mancel BaleWentz, Christinamarie Tall, MD  predniSONE (DELTASONE) 20 MG tablet Take 1 tablet (20 mg total) by mouth 2 (two) times daily. 05/11/21  Yes Mancel BaleWentz, Nacole Fluhr, MD  acetaminophen (TYLENOL) 325 MG tablet Take 2 tablets (650 mg total) by mouth every 6 (six) hours as needed for mild pain (or Fever >/= 101). 07/12/16   Joseph ArtVann, Jessica U, DO  albuterol (PROVENTIL HFA;VENTOLIN HFA) 108 (90 Base) MCG/ACT inhaler Inhale 2 puffs into the lungs every 6 (six) hours as needed for wheezing or shortness of breath.    [provider]  amoxicillin (AMOXIL) 500 MG capsule Take 1 capsule (500 mg total) by mouth 3 (  three) times daily. Patient not taking: Reported on 05/10/2021 04/22/21   Gardiner Barefoot, MD  b complex vitamins tablet Take 1 tablet by mouth daily.    [provider]  Calcium Carb-Cholecalciferol (CALCIUM CARBONATE-VITAMIN D3 PO) Take 1 tablet by mouth daily.    [provider]  docusate sodium (COLACE) 100 MG capsule Take 1 capsule (100 mg total) by mouth 2 (two) times daily. While taking narcotic pain medicine. 03/25/21   Jacinta Shoe, PA-C  DULoxetine (CYMBALTA) 60 MG capsule Take 60 mg by mouth 2 (two) times daily.    [provider]  famotidine (PEPCID) 20 MG tablet Take 20 mg by mouth daily. 01/12/20   [place], Peter M, MD  folic acid (FOLVITE) 1 MG tablet Take 2 mg by mouth daily.    [provider]  furosemide (LASIX) 40 MG tablet Take 1 tablet (40 mg total) by mouth as directed. Patient taking differently: Take 40 mg by mouth daily. 09/06/20   [place], Peter M, MD  gabapentin (NEURONTIN) 300 MG capsule Take 600-900 mg by mouth See admin instructions. Take 3 capsules (900 mg total) by mouth in the morning, 2 capsules (600 mg total) in the early afternoon, and 4 capsules (1200 mg total) in the evening.    [provider]  lidocaine (LIDODERM) 5 % Place 1 patch onto the skin daily as needed (for pain). Remove & Discard patch within 12 hours or as directed by MD    [provider]  magnesium oxide (MAG-OX) 400 MG tablet Take 400 mg by mouth 2 (two) times daily.    [provider]  METHOTREXATE SODIUM IJ Inject 25 mg into the skin every Sunday.    [provider]  montelukast (SINGULAIR) 10 MG tablet Take 10 mg by mouth daily. 12/30/17   [provider]  NUCYNTA 75 MG tablet Take 75 mg by mouth 4 (four) times daily. 03/18/21   [provider]  oxyCODONE (OXY IR/ROXICODONE) 5 MG immediate release tablet Take 5 mg by mouth 3 (three) times daily as needed. 04/07/21   [provider]  Potassium Chloride Crys ER (K-DUR PO) Take 40 mEq by mouth every other day.    [provider]  senna (SENOKOT) 8.6 MG TABS tablet Take 2 tablets (17.2 mg total) by mouth 2 (two) times daily. 03/25/21   Jacinta Shoe, PA-C  tizanidine (ZANAFLEX) 2 MG capsule Take 2 mg by mouth 3 (three) times daily as needed for muscle spasms. 05/10/17   [provider]  topiramate (TOPAMAX) 100 MG tablet Take 100 mg by mouth daily. 01/14/18   [provider]  traZODone (DESYREL) 100 MG tablet Take 1 tablet (100 mg total) by mouth at bedtime. 01/12/20   [place], Peter M, MD  vitamin B-12 (CYANOCOBALAMIN) 1000 MCG tablet Take 1,000 mcg by mouth daily.    [provider]  Vitamin D, Ergocalciferol, (DRISDOL) 50000 units CAPS capsule  Take 50,000 Units by mouth every Friday.    [provider]    Allergies    Ibuprofen and Amoxicillin  Review of Systems   Review of Systems  All other systems reviewed and are negative.  Physical Exam Updated Vital Signs BP 107/74   Pulse 65   Temp 98.6 F (37 C)   Resp 18   Ht 5\' 6"  (1.676 m)   Wt 91.6 kg   SpO2 98%   BMI 32.59 kg/m   Physical Exam Vitals and nursing note reviewed.  Constitutional:  Appearance: She is well-developed.  HENT:     Head: Normocephalic and atraumatic.  Eyes:     Conjunctiva/sclera: Conjunctivae normal.     Pupils: Pupils are equal, round, and reactive to light.  Neck:     Trachea: Phonation normal.  Cardiovascular:     Rate and Rhythm: Normal rate.  Pulmonary:     Effort: Pulmonary effort is normal.  Chest:     Chest wall: No tenderness.  Abdominal:     General: There is no distension.  Musculoskeletal:     Cervical back: Normal range of motion and neck supple.     Comments: Tender left hip without deformity.  She resists movement of the left hip secondary to pain.  Remainder of the lower extremities are nontender to palpation.  She has bilateral forefoot amputations.  Skin:    General: Skin is warm and dry.  Neurological:     Mental Status: She is alert and oriented to person, place, and time.     Motor: No abnormal muscle tone.  Psychiatric:        Mood and Affect: Mood normal.        Behavior: Behavior normal.        Thought Content: Thought content normal.        Judgment: Judgment normal.    ED Results / Procedures / Treatments   Labs (all labs ordered are listed, but only abnormal results are displayed) Labs Reviewed - No data to display  EKG None  Radiology DG Hip Unilat With Pelvis 2-3 Views Left  Result Date: 05/11/2021 CLINICAL DATA:  Severe left hip pain. EXAM: DG HIP (WITH OR WITHOUT PELVIS) 2-3V LEFT COMPARISON:  No recent. FINDINGS: Diffuse osteopenia. Degenerative changes lumbar spine and  both hips. No acute bony or joint abnormality. No evidence of fracture dislocation. IMPRESSION: Diffuse osteopenia degenerative change. No acute abnormality. Left hip is intact. Electronically Signed   By: Maisie Fus  Register   On: 05/11/2021 08:06    Procedures Procedures   Medications Ordered in ED Medications  oxyCODONE-acetaminophen (PERCOCET/ROXICET) 5-325 MG per tablet 1 tablet (1 tablet Oral Given 05/11/21 0807)    ED Course  I have reviewed the triage vital signs and the nursing notes.  Pertinent labs & imaging results that were available during my care of the patient were reviewed by me and considered in my medical decision making (see chart for details).    MDM Rules/Calculators/A&P                            Patient Vitals for the past 24 hrs:  BP Temp Pulse Resp SpO2 Height Weight  05/11/21 1253 107/74 -- 65 18 98 % -- --  05/11/21 1200 102/61 -- 66 -- 94 % -- --  05/11/21 1118 126/71 -- 70 18 100 % -- --  05/11/21 1000 (!) 101/54 -- 61 18 100 % -- --  05/11/21 0945 -- -- 61 -- 98 % -- --  05/11/21 0930 92/69 -- 60 -- 95 % -- --  05/11/21 0915 -- -- 61 -- 97 % -- --  05/11/21 0900 -- -- 60 -- (!) 87 % -- --  05/11/21 0846 100/71 -- 62 18 98 % -- --  05/11/21 0845 -- -- 62 -- 100 % -- --  05/11/21 0830 100/71 -- -- -- -- -- --  05/11/21 0800 (!) 105/55 -- 62 -- 93 % -- --  05/11/21 0745 -- -- 67 --  98 % -- --  05/11/21 0730 99/63 -- 60 -- 100 % -- --  05/11/21 0715 -- -- 63 -- 99 % -- --  05/11/21 0700 93/67 -- 66 -- 94 % -- --  05/11/21 0648 -- -- 73 -- 95 % -- --  05/11/21 0647 94/67 98.6 F (37 C) 73 19 96 % -- --  05/11/21 0646 -- -- -- -- -- 5\' 6"  (1.676 m) 91.6 kg    1:17 PM Reevaluation with update and discussion. After initial assessment and treatment, an updated evaluation reveals she was able to ambulate 10 feet weight on her left leg.  Findings discussed with the patient and all questions were answered.   3:10 PM-patient's husband  returned to pick her up and take her.  I discussed the findings with her and him.  All questions answered  Medical Decision Making:  This patient is presenting for evaluation of left hip pain, which does require a range of treatment options, and is a complaint that involves a moderate risk of morbidity and mortality. The differential diagnoses include lumbar radiculopathy, hip fracture, arthritis, various bony deformities. I decided to review old records, and in summary patient with complicated recent history including osteoarthritis, with bilateral partial foot amputations.  She has been cleared recently by ID.  She has a history of back pain treated by her PCP.  She has stage III kidney disease, obstructive sleep apnea, chronic use of steroids, neuropathy and obesity..  I obtained additional historical information from husband at the bedside.    Critical Interventions-clinical evaluation, medication treatment, radiography, observation and reassessment  After These Interventions, the Patient was reevaluated and was found with probable source of pain being arthritis of the left hip.  This appears uncomplicated.  No evidence for lumbar myelopathy or significant ongoing evidence for lumbar radiculopathy.  Patient is able to ambulate, albeit with pain.  No indication for hospitalization or advanced imaging at this time.  CRITICAL CARE-no Performed by: Mancel Bale  Nursing Notes Reviewed/ Care Coordinated Applicable Imaging Reviewed Interpretation of Laboratory Data incorporated into ED treatment  The patient appears reasonably screened and/or stabilized for discharge and I doubt any other medical condition or other [facility] requiring further screening, evaluation, or treatment in the ED at this time prior to discharge.  Plan: Home Medications-continue usual; Home Treatments-gradual advance activity; return here if the recommended treatment, does not improve the symptoms; Recommended follow  up-orthopedic follow-up for further care and treatment as needed     Final Clinical Impression(s) / ED Diagnoses Final diagnoses:  Left hip pain  Arthritis, hip    Rx / DC Orders ED Discharge Orders          Ordered    oxyCODONE-acetaminophen (PERCOCET/ROXICET) 5-325 MG tablet  Every 6 hours PRN        05/11/21 1323    predniSONE (DELTASONE) 20 MG tablet  2 times daily        05/11/21 1325             05/13/21, MD 05/12/21 1604

## 2021-06-01 ENCOUNTER — Ambulatory Visit (INDEPENDENT_AMBULATORY_CARE_PROVIDER_SITE_OTHER): Payer: BC Managed Care – PPO | Admitting: Infectious Disease

## 2021-06-01 ENCOUNTER — Other Ambulatory Visit: Payer: Self-pay

## 2021-06-01 ENCOUNTER — Encounter: Payer: Self-pay | Admitting: Infectious Disease

## 2021-06-01 VITALS — BP 100/67 | HR 91 | Temp 97.5°F | Resp 16 | Ht 66.0 in | Wt 200.0 lb

## 2021-06-01 DIAGNOSIS — E2749 Other adrenocortical insufficiency: Secondary | ICD-10-CM

## 2021-06-01 DIAGNOSIS — M25559 Pain in unspecified hip: Secondary | ICD-10-CM

## 2021-06-01 DIAGNOSIS — M86472 Chronic osteomyelitis with draining sinus, left ankle and foot: Secondary | ICD-10-CM | POA: Diagnosis not present

## 2021-06-01 DIAGNOSIS — M313 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: Secondary | ICD-10-CM

## 2021-06-01 DIAGNOSIS — R21 Rash and other nonspecific skin eruption: Secondary | ICD-10-CM

## 2021-06-01 DIAGNOSIS — M25532 Pain in left wrist: Secondary | ICD-10-CM

## 2021-06-01 HISTORY — DX: Pain in left wrist: M25.532

## 2021-06-01 MED ORDER — AMOXICILLIN-POT CLAVULANATE 875-125 MG PO TABS: 1.0000 | ORAL_TABLET | Freq: Two times a day (BID) | ORAL | 2 refills | Status: DC

## 2021-06-01 MED ORDER — FLUCONAZOLE 200 MG PO TABS: 400.0000 mg | ORAL_TABLET | Freq: Every day | ORAL | 2 refills | Status: DC

## 2021-06-01 NOTE — Progress Notes (Signed)
Subjective:  Chief complaint: Pain at her left amputation site with drainage also has symptoms of fevers chills and nausea and finally left hip pain and left wrist pain   Patient ID: Gregary CromerLisa Wilk, female    DOB: November 08, 1965, 55 y.o.   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 Victorino DikeHewitt on 24 March 2021.  Her operative cultures yielded ampicillin sensitive Enterococcus faecalis and Candida albicans.  The patient was placed on high-dose IV ampicillin along with 400 mg of fluconazole.  Is followed closely by my partner Dr. Luciana Axeomer and on 19 July she seems to be doing quite well and her osteomyelitis appear to be cured.  She was taken off IV antibiotics and fluconazole was stopped.  The following day she had developed severe left hip pain which brought her to the emergency department.  Plain films of the hip were unrevealing and she was given a corticosteroid taper.  Since then in the last 2 weeks she has developed some drainage from her left amputation site laterally where she had prior osteomyelitis she tells me.  She also has had subjective fevers nausea malaise and is anxious that her infection is returned.  She has some left wrist pain as well but there is not much evidence for infection on exam there.  She also is complaining of ulcers which she is tells me she has been told are due to her Wegener's.  She is currently taking prednisone and methotrexate for her Wegener's and [facility].  Past Medical History:  Diagnosis Date   Chronic cough    Chronic pain    Diabetes  mellitus without complication (HCC)    Dyspnea    GERD (gastroesophageal reflux disease)    Hypokalemia    Hypothyroidism    Neurocardiogenic syncope    OSA (obstructive sleep apnea)    does not use CPAP   Osteomyelitis (HCC)    bilateral feet   Peripheral neuropathy    Presence of intrathecal pump    recieves Prialt/bupivicaine   Wegener's granulomatosis 2009   Wegner's disease (congenital syphilitic osteochondritis)     Past Surgical History:  Procedure Laterality Date   AMPUTATION Right 10/21/2020   Procedure: Right hallux amputation;  Surgeon: Toni ArthursHewitt, John, MD;  Location: [facility];  Service: Orthopedics;  Laterality: Right;   AMPUTATION TOE Bilateral 08/12/2020   Procedure: Right 3rd toe amputation; left hallux and 3rd toe amputation;  Surgeon: Toni ArthursHewitt, John, MD;  Location: [facility];  Service: Orthopedics;  Laterality: Bilateral;  45min   AMPUTATION TOE Right 03/24/2021   Procedure: AMPUTATION TOE;  Surgeon: Toni ArthursHewitt, John, MD;  Location: [facility] OR;  Service: Orthopedics;  Laterality: Right;   APPENDECTOMY     BACK SURGERY     CHOLECYSTECTOMY     COLONOSCOPY N/A 06/02/2013   Procedure: COLONOSCOPY;  Surgeon: Graylin ShiverSalem F Ganem, MD;  Location: [facility];  Service: Endoscopy;  Laterality: N/A;   HERNIA REPAIR  2000   INCISION AND DRAINAGE ABSCESS Left 07/07/2016   Procedure: DEBRIDMENT LEFT THIGH ABSCESS, EXISION ACUTE SKIN RASH LEFT THIGH(1CM LESION);  Surgeon: Claud KelpHaywood Ingram, MD;  Location: [facility];  Service: General;  Laterality: Left;  SEPTOPLASTY  02/2013   spleen anuyism     splenic aneurysm     TRANSMETATARSAL AMPUTATION Bilateral 10/21/2020   Procedure: Left transmetatarsal amputation;  Surgeon: Toni Arthurs, MD;  Location: [facility];  Service: Orthopedics;  Laterality: Bilateral;   TRANSMETATARSAL AMPUTATION Left 03/24/2021   Procedure: TRANSMETATARSAL AMPUTATION;  Surgeon: Toni Arthurs, MD;  Location: [facility] OR;  Service: Orthopedics;   Laterality: Left;   VAGINAL HYSTERECTOMY      Family History  Problem Relation Age of Onset   Diabetes Mother    Heart disease Mother    Diabetes Father       Social History   Socioeconomic History   Marital status: Married    Spouse name: Not on file   Number of children: Not on file   Years of education: Not on file   Highest education level: Not on file  Occupational History   Not on file  Tobacco Use   Smoking status: Never   Smokeless tobacco: Never  Vaping Use   Vaping Use: Never used  Substance and Sexual Activity   Alcohol use: Yes    Comment: occasionally   Drug use: No   Sexual activity: Yes    Birth control/protection: Surgical  Other Topics Concern   Not on file  Social History Narrative   Not on file   Social Determinants of Health   Financial Resource Strain: Not on file  Food Insecurity: Not on file  Transportation Needs: Not on file  Physical Activity: Not on file  Stress: Not on file  Social Connections: Not on file    Allergies  Allergen Reactions   Ibuprofen Other (See Comments)    Pt is unable to take this due to kidney problems.      Amoxicillin Rash     Current Outpatient Medications:    acetaminophen (TYLENOL) 325 MG tablet, Take 2 tablets (650 mg total) by mouth every 6 (six) hours as needed for mild pain (or Fever >/= 101)., Disp: , Rfl:    albuterol (PROVENTIL HFA;VENTOLIN HFA) 108 (90 Base) MCG/ACT inhaler, Inhale 2 puffs into the lungs every 6 (six) hours as needed for wheezing or shortness of breath., Disp: , Rfl:    b complex vitamins tablet, Take 1 tablet by mouth daily., Disp: , Rfl:    Calcium Carb-Cholecalciferol (CALCIUM CARBONATE-VITAMIN D3 PO), Take 1 tablet by mouth daily., Disp: , Rfl:    docusate sodium (COLACE) 100 MG capsule, Take 1 capsule (100 mg total) by mouth 2 (two) times daily. While taking narcotic pain medicine., Disp: 30 capsule, Rfl: 0   DULoxetine (CYMBALTA) 60 MG capsule, Take 60 mg by mouth 2 (two)  times daily., Disp: , Rfl:    famotidine (PEPCID) 20 MG tablet, Take 20 mg by mouth daily., Disp: , Rfl:    folic acid (FOLVITE) 1 MG tablet, Take 2 mg by mouth daily., Disp: , Rfl:    furosemide (LASIX) 40 MG tablet, Take 1 tablet (40 mg total) by mouth as directed. (Patient taking differently: Take 40 mg by mouth daily.), Disp: 90 tablet, Rfl: 3   gabapentin (NEURONTIN) 300 MG capsule, Take 600-900 mg by mouth See admin instructions. Take 3 capsules (900 mg total) by mouth in the morning, 2 capsules (600 mg total) in the early afternoon, and 4 capsules (1200 mg total) in the evening., Disp: , Rfl:    lidocaine (LIDODERM) 5 %, Place 1 patch onto the skin daily as needed (for pain). Remove & Discard patch  within 12 hours or as directed by MD, Disp: , Rfl:    magnesium oxide (MAG-OX) 400 MG tablet, Take 400 mg by mouth 2 (two) times daily., Disp: , Rfl:    METHOTREXATE SODIUM IJ, Inject 25 mg into the skin every Sunday., Disp: , Rfl:    montelukast (SINGULAIR) 10 MG tablet, Take 10 mg by mouth daily., Disp: , Rfl: 3   oxyCODONE (OXY IR/ROXICODONE) 5 MG immediate release tablet, Take 5 mg by mouth 3 (three) times daily as needed., Disp: , Rfl:    Potassium Chloride Crys ER (K-DUR PO), Take 40 mEq by mouth every other day., Disp: , Rfl:    predniSONE (DELTASONE) 20 MG tablet, Take 1 tablet (20 mg total) by mouth 2 (two) times daily., Disp: 10 tablet, Rfl: 0   senna (SENOKOT) 8.6 MG TABS tablet, Take 2 tablets (17.2 mg total) by mouth 2 (two) times daily., Disp: 30 tablet, Rfl: 0   tizanidine (ZANAFLEX) 2 MG capsule, Take 2 mg by mouth 3 (three) times daily as needed for muscle spasms., Disp: , Rfl:    topiramate (TOPAMAX) 100 MG tablet, Take 100 mg by mouth daily., Disp: , Rfl:    traZODone (DESYREL) 100 MG tablet, Take 1 tablet (100 mg total) by mouth at bedtime., Disp:  , Rfl:    vitamin B-12 (CYANOCOBALAMIN) 1000 MCG tablet, Take 1,000 mcg by mouth daily., Disp: , Rfl:    Vitamin D, Ergocalciferol,  (DRISDOL) 50000 units CAPS capsule, Take 50,000 Units by mouth every Friday., Disp: , Rfl:    NUCYNTA 75 MG tablet, Take 75 mg by mouth 4 (four) times daily. (Patient not taking: Reported on 06/01/2021), Disp: , Rfl:    oxyCODONE-acetaminophen (PERCOCET/ROXICET) 5-325 MG tablet, Take 1 tablet by mouth every 6 (six) hours as needed for severe pain. (Patient not taking: Reported on 06/01/2021), Disp: 10 tablet, Rfl: 0   Review of Systems  Constitutional:  Positive for fatigue and fever. Negative for chills.  HENT:  Negative for congestion and sore throat.   Eyes:  Negative for photophobia.  Respiratory:  Negative for cough, shortness of breath and wheezing.   Cardiovascular:  Negative for chest pain, palpitations and leg swelling.  Gastrointestinal:  Positive for nausea. Negative for abdominal pain, blood in stool, constipation, diarrhea and vomiting.  Genitourinary:  Negative for dysuria, flank pain and hematuria.  Musculoskeletal:  Negative for back pain and myalgias.  Skin:  Positive for wound. Negative for rash.  Neurological:  Negative for dizziness, weakness and headaches.  Hematological:  Does not bruise/bleed easily.  Psychiatric/Behavioral:  Negative for agitation, behavioral problems, confusion, decreased concentration, dysphoric mood, hallucinations and suicidal ideas. The patient is nervous/anxious.       Objective:   Physical Exam Constitutional:      General: She is not in acute distress.    Appearance: She is not diaphoretic.  HENT:     Head: Normocephalic and atraumatic.     Right Ear: External ear normal.     Left Ear: External ear normal.     Nose: Nose normal.     Mouth/Throat:     Pharynx: No oropharyngeal exudate.  Eyes:     General: No scleral icterus.       Right eye: No discharge.        Left eye: No discharge.     Extraocular Movements: Extraocular movements intact.     Conjunctiva/sclera: Conjunctivae normal.  Cardiovascular:     Rate and Rhythm: Normal  rate and regular  rhythm.     Heart sounds:    Friction rub present.  Pulmonary:     Effort: Pulmonary effort is normal. No respiratory distress.     Breath sounds: No wheezing.  Abdominal:     General: There is no distension.     Palpations: Abdomen is soft.     Tenderness: There is no rebound.  Musculoskeletal:        General: Normal range of motion.     Left wrist: No swelling, deformity or effusion.     Cervical back: Normal range of motion and neck supple.     Left hip: Tenderness present. No deformity or crepitus. Normal range of motion. Normal strength.  Lymphadenopathy:     Cervical: No cervical adenopathy.  Skin:    General: Skin is warm and dry.     Coloration: Skin is pale. Skin is not jaundiced.     Findings: No erythema, lesion or rash.  Neurological:     General: No focal deficit present.     Mental Status: She is alert and oriented to person, place, and time.  Psychiatric:        Mood and Affect: Mood normal.        Behavior: Behavior normal.        Thought Content: Thought content normal.        Judgment: Judgment normal.    Left foot with area where she says drainage is happening from June 01, 2021:    Right transmetatarsal amputation site June 01, 2021:       Cutaneous lesions or she has some ulcerations thought to be manifestations of her Wegener's vasculitis otitis June 01, 2021:       Assessment & Plan:   New drainage at site where she had osteomyelitis: I am concerned that she may have lingering infection in fact deep bone infection.  In particular the fact that she had a fungus involved with her osteomyelitis is disconcerting. Candida does not typically get "mopped" up with antibiotics very easily  And recheck a sed rate and CRP understanding that interpreting these labs may difficult in context of Wegener's.  We will also check a metabolic panel with GFR and a CBC with differential.  I am resending and fluconazole 40 mg/day and  Augmentin 875 125 twice daily with a month supply and refills.  She will be seeing Dr. Victorino Dike next week.  I have scheduled her to come back to clinic in 1 month's time (when I offered to schedule her with Dr Luciana Axe she requested if I could see her in a month--which I said I would be happy to do and I was sure that Dr Luciana Axe would not in any way take offense  Left hip pain: Plain films were unrevealing but the degree of pain she was experiencing is disconcerting I am ordering an MRI with contrast to evaluate this area as well.  Wegener's granulomatosis: She certainly needs to avoid potent immunosuppressive drugs I noted the interaction from the Augmentin potentially increasing methotrexate levels and instructed her to get in touch with her rheumatologist to see if any dose adjustments or methotrexate need to be to made  In: Not clear what is causing this but it does not appear to be due to infection there is no evidence clinically of a septic joint.  I spent more than 42  minutes with the patient including  face to face counseling of the patient personally reviewing plain films of her hip performed in the ER  on 20 July, reviewing her microbiological data from the operating room on March 24, 2021, review of her inflammatory markers over time, review of hospital and clinic records  preparation for the visit and during the visit and in coordination of her care.

## 2021-06-02 LAB — COMPLETE METABOLIC PANEL WITH GFR
AG Ratio: 1.9 (calc) (ref 1.0–2.5)
ALT: 11 U/L (ref 6–29)
AST: 12 U/L (ref 10–35)
Albumin: 3.7 g/dL (ref 3.6–5.1)
Alkaline phosphatase (APISO): 75 U/L (ref 37–153)
BUN/Creatinine Ratio: 10 (calc) (ref 6–22)
BUN: 10 mg/dL (ref 7–25)
CO2: 25 mmol/L (ref 20–32)
Calcium: 9.3 mg/dL (ref 8.6–10.4)
Chloride: 110 mmol/L (ref 98–110)
Creat: 1.04 mg/dL — ABNORMAL HIGH (ref 0.50–1.03)
Globulin: 2 g/dL (calc) (ref 1.9–3.7)
Glucose, Bld: 130 mg/dL — ABNORMAL HIGH (ref 65–99)
Potassium: 4.5 mmol/L (ref 3.5–5.3)
Sodium: 142 mmol/L (ref 135–146)
Total Bilirubin: 0.2 mg/dL (ref 0.2–1.2)
Total Protein: 5.7 g/dL — ABNORMAL LOW (ref 6.1–8.1)
eGFR: 63 mL/min/{1.73_m2} (ref >=60)

## 2021-06-02 LAB — CBC WITH DIFFERENTIAL/PLATELET
Absolute Monocytes: 759 cells/uL (ref 200–950)
Basophils Absolute: 66 cells/uL (ref 0–200)
Basophils Relative: 0.6 %
Eosinophils Absolute: 396 cells/uL (ref 15–500)
Eosinophils Relative: 3.6 %
HCT: 32.2 % — ABNORMAL LOW (ref 35.0–45.0)
Hemoglobin: 9.5 g/dL — ABNORMAL LOW (ref 11.7–15.5)
Lymphs Abs: 4565 cells/uL — ABNORMAL HIGH (ref 850–3900)
MCH: 23.9 pg — ABNORMAL LOW (ref 27.0–33.0)
MCHC: 29.5 g/dL — ABNORMAL LOW (ref 32.0–36.0)
MCV: 80.9 fL (ref 80.0–100.0)
MPV: 9.9 fL (ref 7.5–12.5)
Monocytes Relative: 6.9 %
Neutro Abs: 5214 cells/uL (ref 1500–7800)
Neutrophils Relative %: 47.4 %
Platelets: 367 10*3/uL (ref 140–400)
RBC: 3.98 10*6/uL (ref 3.80–5.10)
RDW: 16.8 % — ABNORMAL HIGH (ref 11.0–15.0)
Total Lymphocyte: 41.5 %
WBC: 11 10*3/uL — ABNORMAL HIGH (ref 3.8–10.8)

## 2021-06-02 LAB — C-REACTIVE PROTEIN: CRP: 4 mg/L (ref <8.0)

## 2021-06-02 LAB — SEDIMENTATION RATE: Sed Rate: 14 mm/h (ref 0–30)

## 2021-06-05 ENCOUNTER — Other Ambulatory Visit: Payer: Self-pay

## 2021-06-05 ENCOUNTER — Ambulatory Visit (HOSPITAL_COMMUNITY)
Admission: RE | Admit: 2021-06-05 | Discharge: 2021-06-05 | Disposition: A | Discharge status: Home / Self Care | Payer: BC Managed Care – PPO | Source: Ambulatory Visit | Attending: Infectious Disease | Admitting: Infectious Disease

## 2021-06-05 DIAGNOSIS — M25559 Pain in unspecified hip: Secondary | ICD-10-CM

## 2021-06-05 DIAGNOSIS — M86472 Chronic osteomyelitis with draining sinus, left ankle and foot: Secondary | ICD-10-CM | POA: Diagnosis present

## 2021-06-05 MED ORDER — GADOBUTROL 1 MMOL/ML IV SOLN
10.0000 mL | Freq: Once | INTRAVENOUS | Status: AC | PRN
  Administered 2021-06-05: 10 mL via INTRAVENOUS

## 2021-06-07 ENCOUNTER — Telehealth: Payer: Self-pay

## 2021-06-07 NOTE — Telephone Encounter (Signed)
Please call patient to go over MRI results ordered by Dr Daiva Eves. She called [facility] and I advised that I will have [facility] triage call her back after the provider reviews results.

## 2021-06-07 NOTE — Telephone Encounter (Signed)
-----   Message from Randall Hiss, MD sent at 06/06/2021  1:28 PM EDT ----- Patient with what looks like possible infection with fluid in the gluteus medius. I have sent text to Dr. Victorino Dike. Patient is to see him this week. If she does not end up w surgery need to consider IR guided drainage for culture and continued antibiotics (she is on fluconazole and augmentin presently) ----- Message ----- From: Interface, Rad Results In Sent: 06/06/2021   1:07 PM EDT To: Randall Hiss, MD

## 2021-06-07 NOTE — Telephone Encounter (Signed)
Advised patient [name] would like for her to remain off of antibiotics for right now. Patient verbalized understanding, she is wondering if she needs to follow up sooner.   Sandie Ano, RN

## 2021-06-07 NOTE — Telephone Encounter (Signed)
Spoke with patient, relayed MRI results per Dr. Daiva Eves that there is possible infection with fluid in the gluteus medius.   Patient states she saw Dr. Victorino Dike yesterday and will follow up with him again in two weeks.   She says she has developed a rash from the Augmentin. Denies wheezing, shortness of breath and has stopped taking the Augmentin. She would like an alternate medication. Will route to provider.   Sandie Ano, RN

## 2021-06-09 NOTE — Progress Notes (Signed)
I talked with Charlann Boxer and he reviewed the MRI with me.  She's not a good surgical candidate.  He agreed that IR was the best bet to have it aspirated.  The tear at the gluteus medius is of no consequence.  Since you have access to OP Epic, could you pls try to order?  Thx, JDH

## 2021-06-10 ENCOUNTER — Other Ambulatory Visit: Payer: Self-pay | Admitting: Infectious Disease

## 2021-06-10 DIAGNOSIS — M60009 Infective myositis, unspecified site: Secondary | ICD-10-CM

## 2021-06-13 ENCOUNTER — Telehealth: Payer: Self-pay

## 2021-06-13 NOTE — Telephone Encounter (Signed)
[name] at [facility] IR is waiting to hear back from the radiologist and will reach out to [place] for scheduling.   Sandie Ano, RN

## 2021-06-13 NOTE — Telephone Encounter (Signed)
-----   Message from Randall Hiss, MD sent at 06/13/2021  8:39 AM EDT ----- I also received note from her Rheumatologist who has stopped her methotrexate for now. SHe was asking about surgery but as Dr Victorino Dike mentioned she is not deemed a good surgical candidate ----- Message ----- From: Rosanna Randy, RN Sent: 06/10/2021   9:40 AM EDT To: Randall Hiss, MD, Toni Arthurs, MD, #  I've reached out to IR to see about getting this scheduled. Awaiting response.  ----- Message ----- From: Daiva Eves, Lisette Grinder, MD Sent: 06/10/2021   8:43 AM EDT To: Toni Arthurs, MD, [facility] Triage Nurse Pool  Sounds good Jonny Ruiz I put an order in epic for IR to evaluate her for aspirate for aerobic anaerobic culture.  Can we let her know? I am  ccing RN triage at my clinic ----- Message ----- From: Toni Arthurs, MD Sent: 06/09/2021   3:45 PM EDT To: Randall Hiss, MD  I talked with Charlann Boxer and he reviewed the MRI with me.  She's not a good surgical candidate.  He agreed that IR was the best bet to have it aspirated.  The tear at the gluteus medius is of no consequence.  Since you have access to OP Epic, could you pls  try to order?  Thx, JDH

## 2021-06-14 NOTE — Telephone Encounter (Signed)
RN contacted [name] with Radiology to follow up about aspiration. She hasn't heard back from radiologist yet about appointment.   [name] Loyola Mast, RN

## 2021-06-15 ENCOUNTER — Encounter (HOSPITAL_COMMUNITY): Payer: Self-pay | Admitting: Radiology

## 2021-06-15 ENCOUNTER — Telehealth (HOSPITAL_COMMUNITY): Payer: Self-pay

## 2021-06-15 NOTE — Telephone Encounter (Signed)
-----   Message from Sterling Big, MD sent at 06/14/2021  4:10 PM EDT ----- Regarding: RE: Aspiration Approved for attempted US guided aspiration.  Highly unlikely there will be anything to aspirate, but we can look with [place].    HKM   ----- Message ----- From: Anderson Malta Sent: 06/14/2021   3:42 PM EDT To: Ir Procedure Requests Subject: FW: Aspiration                                  ----- Message ----- From: Anderson Malta Sent: 06/13/2021  10:42 AM EDT To: Ir Procedure Requests Subject: Aspiration                                     Procedure: please evaluate for IR guided aspirate of abscess for aerobic anaerobic cultures  Diagnosis: Pyomyositis  Ordering: Dr. Paulette Blanch Dam [phone] (Infectious Disease)  Imaging: MR hip done 06/05/21  Please review.   Thanks,  Fara Boros

## 2021-06-15 NOTE — Progress Notes (Signed)
Patient Name  Jorden, Mahl Legal Sex  Female DOB  06/20/1966 SSN  [id number] Address  [address]  [place] [address] Phone  [phone] (Home)  [phone] (Mobile) *Preferred*    FW: Aspiration Received: Today Altamese Dilling M      Previous Messages   ----- Message -----  From: Sterling Big, MD  Sent: 06/14/2021   4:11 PM EDT  To: Anderson Malta, Ir Procedure Requests  Subject: RE: Aspiration                                 Approved for attempted US guided aspiration.  Highly unlikely there will be anything to aspirate, but we can look with [place].     HKM    ----- Message -----  From: Anderson Malta  Sent: 06/14/2021   3:42 PM EDT  To: Ir Procedure Requests  Subject: FW: Aspiration                                  ----- Message -----  From: Anderson Malta  Sent: 06/13/2021  10:42 AM EDT  To: Ir Procedure Requests  Subject: Aspiration                                     Procedure: please evaluate for IR guided aspirate of abscess for aerobic anaerobic cultures   Diagnosis: Pyomyositis   Ordering: Dr. Paulette Blanch Dam [phone] (Infectious Disease)   Imaging: MR hip done 06/05/21   Please review.   Thanks,  Fara Boros

## 2021-06-17 ENCOUNTER — Other Ambulatory Visit: Payer: Self-pay | Admitting: Radiology

## 2021-06-20 ENCOUNTER — Other Ambulatory Visit: Payer: Self-pay

## 2021-06-20 ENCOUNTER — Ambulatory Visit (HOSPITAL_COMMUNITY)
Admission: RE | Admit: 2021-06-20 | Discharge: 2021-06-20 | Disposition: A | Discharge status: Home / Self Care | Payer: BC Managed Care – PPO | Source: Ambulatory Visit | Attending: Infectious Disease | Admitting: Infectious Disease

## 2021-06-20 DIAGNOSIS — M60009 Infective myositis, unspecified site: Secondary | ICD-10-CM | POA: Insufficient documentation

## 2021-06-20 LAB — GLUCOSE, CAPILLARY: Glucose-Capillary: 125 mg/dL — ABNORMAL HIGH (ref 70–99)

## 2021-06-20 MED ORDER — FENTANYL CITRATE (PF) 100 MCG/2ML IJ SOLN
INTRAMUSCULAR | Status: AC | PRN
  Administered 2021-06-20: 50 ug via INTRAVENOUS

## 2021-06-20 MED ORDER — MIDAZOLAM HCL 2 MG/2ML IJ SOLN
INTRAMUSCULAR | Status: AC
  Filled 2021-06-20: qty 4

## 2021-06-20 MED ORDER — LIDOCAINE HCL 1 % IJ SOLN
INTRAMUSCULAR | Status: AC
  Filled 2021-06-20: qty 20

## 2021-06-20 MED ORDER — MIDAZOLAM HCL 2 MG/2ML IJ SOLN
INTRAMUSCULAR | Status: AC | PRN
  Administered 2021-06-20: 1 mg via INTRAVENOUS

## 2021-06-20 MED ORDER — FENTANYL CITRATE (PF) 100 MCG/2ML IJ SOLN
INTRAMUSCULAR | Status: AC
  Filled 2021-06-20: qty 2

## 2021-06-20 MED ORDER — SODIUM CHLORIDE 0.9 % IV SOLN: INTRAVENOUS | Status: DC

## 2021-06-20 NOTE — Procedures (Signed)
Vascular and Interventional Radiology Procedure Note  Patient: [name] DOB: 1966/10/11 Medical Record Number: [id number] Note Date/Time: 06/20/21 2:10 PM   Performing Physician: Roanna Banning, MD Assistant(s): None  Diagnosis: Left Hip peri-articular collection  Procedure: ASPIRATION OF LEFT HIP PERIARTICULAR INTRAMUSCULAR COLLECTION  Anesthesia: Conscious Sedation Complications: None Estimated Blood Loss:  0 mL Specimens: Sent for Aerobe Culture and Anerobe Culture  Findings:  Successful Ultrasound-guided Left peri-articular intramuscular collection aspiration.   See detailed procedure note with images in PACS. The patient tolerated the procedure well without incident or complication and was returned to Recovery in stable condition.    Roanna Banning, MD Vascular and Interventional Radiology [facility] Radiology   Pager. [phone] Clinic. [phone]

## 2021-06-20 NOTE — H&P (Signed)
Chief Complaint: Concern for septic arthritis. Request is for hip aspiration  Referring Physician(s): Zenaida Niece Dam,Cornelius N  Supervising Physician: Roanna Banning  Patient Status: [facility] [place] - Out-pt  History of Present Illness: [name] is a 55 y.o. female 55 y.o. female outpatient. History of DM, GERD,  peripheral neuropathy, osteomyelitis s/p left  transmetatarsal amputation. MR hip from 8.14.22 reads Rim enhancing intramuscular collection along the outer aspect of the gluteus minimus and medius muscles measuring 1.5 x 1.0 cm in the axial dimension and 6.2 cm in craniocaudal extent. team is requesting an aspiration of the gluteus minims abscess for further evaluation of septic arthritis.  Currently without any significant complaints. Patient alert and laying in bed, calm and comfortable. Endorses left sided hip pain. Denies any fevers, headache, chest pain, SOB, cough, abdominal pain, nausea, vomiting or bleeding. Return precautions and treatment recommendations and follow-up discussed with the patient who is agreeable with the plan.  Past Medical History:  Diagnosis Date   Chronic cough    Chronic pain    Diabetes mellitus without complication (HCC)    Dyspnea    GERD (gastroesophageal reflux disease)    Hypokalemia    Hypothyroidism    Left wrist pain 06/01/2021   Neurocardiogenic syncope    OSA (obstructive sleep apnea)    does not use CPAP   Osteomyelitis (HCC)    bilateral feet   Peripheral neuropathy    Presence of intrathecal pump    recieves Prialt/bupivicaine   Wegener's granulomatosis 2009   Wegner's disease (congenital syphilitic osteochondritis)     Past Surgical History:  Procedure Laterality Date   AMPUTATION Right 10/21/2020   Procedure: Right hallux amputation;  Surgeon: Toni Arthurs, MD;  Location: [facility];  Service: Orthopedics;  Laterality: Right;   AMPUTATION TOE Bilateral 08/12/2020   Procedure: Right 3rd toe amputation; left hallux  and 3rd toe amputation;  Surgeon: Toni Arthurs, MD;  Location: [facility];  Service: Orthopedics;  Laterality: Bilateral;    AMPUTATION TOE Right 03/24/2021   Procedure: AMPUTATION TOE;  Surgeon: Toni Arthurs, MD;  Location: [facility] OR;  Service: Orthopedics;  Laterality: Right;   APPENDECTOMY     BACK SURGERY     CHOLECYSTECTOMY     COLONOSCOPY N/A 06/02/2013   Procedure: COLONOSCOPY;  Surgeon: Graylin Shiver, MD;  Location: [facility];  Service: Endoscopy;  Laterality: N/A;   HERNIA REPAIR  2000   INCISION AND DRAINAGE ABSCESS Left 07/07/2016   Procedure: DEBRIDMENT LEFT THIGH ABSCESS, EXISION ACUTE SKIN RASH LEFT THIGH(1CM LESION);  Surgeon: Claud Kelp, MD;  Location: [facility];  Service: General;  Laterality: Left;   SEPTOPLASTY  02/2013   spleen anuyism     splenic aneurysm     TRANSMETATARSAL AMPUTATION Bilateral 10/21/2020   Procedure: Left transmetatarsal amputation;  Surgeon: Toni Arthurs, MD;  Location: [facility];  Service: Orthopedics;  Laterality: Bilateral;   TRANSMETATARSAL AMPUTATION Left 03/24/2021   Procedure: TRANSMETATARSAL AMPUTATION;  Surgeon: Toni Arthurs, MD;  Location: [facility] OR;  Service: Orthopedics;  Laterality: Left;   VAGINAL HYSTERECTOMY      Allergies: Ibuprofen  Medications: Prior to Admission medications   Medication Sig Start Date End Date Taking? Authorizing Provider  acetaminophen (TYLENOL) 325 MG tablet Take 2 tablets (650 mg total) by mouth every 6 (six) hours as needed for mild pain (or Fever >/= 101). 07/12/16   Joseph Art, DO  albuterol (PROVENTIL HFA;VENTOLIN HFA) 108 (90 Base) MCG/ACT inhaler Inhale 2 puffs  into the lungs every 6 (six) hours as needed for wheezing or shortness of breath.    [provider]  amoxicillin-clavulanate (AUGMENTIN) 875-125 MG tablet Take 1 tablet by mouth 2 (two) times daily. 06/01/21   Randall Hiss, MD  b complex vitamins tablet Take 1 tablet by mouth daily.    [provider]  Calcium Carb-Cholecalciferol (CALCIUM CARBONATE-VITAMIN D3 PO) Take 1 tablet by mouth daily.    [provider]  docusate sodium (COLACE) 100 MG capsule Take 1 capsule (100 mg total) by mouth 2 (two) times daily. While taking narcotic pain medicine. 03/25/21   Jacinta Shoe, PA-C  DULoxetine (CYMBALTA) 60 MG capsule Take 60 mg by mouth 2 (two) times daily.    [provider]  famotidine (PEPCID) 20 MG tablet Take 20 mg by mouth daily. 01/12/20   [place], Peter M, MD  fluconazole (DIFLUCAN) 200 MG tablet Take 2 tablets (400 mg total) by mouth daily. 06/01/21   Randall Hiss, MD  folic acid (FOLVITE) 1 MG tablet Take 2 mg by mouth daily.    [provider]  furosemide (LASIX) 40 MG tablet Take 1 tablet (40 mg total) by mouth as directed. Patient taking differently: Take 40 mg by mouth daily. 09/06/20   [place], Peter M, MD  gabapentin (NEURONTIN) 300 MG capsule Take 600-900 mg by mouth See admin instructions. Take 3 capsules (900 mg total) by mouth in the morning, 2 capsules (600 mg total) in the early afternoon, and 4 capsules (1200 mg total) in the evening.    [provider]  lidocaine (LIDODERM) 5 % Place 1 patch onto the skin daily as needed (for pain). Remove & Discard patch within 12 hours or as directed by MD    [provider]  magnesium oxide (MAG-OX) 400 MG tablet Take 400 mg by mouth 2 (two) times daily.    [provider]  METHOTREXATE SODIUM IJ Inject 25 mg into the skin every Sunday.    [provider]  montelukast (SINGULAIR) 10 MG tablet Take 10 mg by mouth daily. 12/30/17   [provider]  NUCYNTA 75 MG tablet Take 75 mg by mouth 4 (four) times daily. Patient not taking: Reported on 06/01/2021 03/18/21   [provider]  oxyCODONE (OXY IR/ROXICODONE) 5 MG immediate release tablet Take 5 mg by mouth 3 (three) times daily as needed. 04/07/21   [provider]   oxyCODONE-acetaminophen (PERCOCET/ROXICET) 5-325 MG tablet Take 1 tablet by mouth every 6 (six) hours as needed for severe pain. Patient not taking: Reported on 06/01/2021 05/11/21   Mancel Bale, MD  Potassium Chloride Crys ER (K-DUR PO) Take 40 mEq by mouth every other day.    [provider]  predniSONE (DELTASONE) 20 MG tablet Take 1 tablet (20 mg total) by mouth 2 (two) times daily. 05/11/21   Mancel Bale, MD  senna (SENOKOT) 8.6 MG TABS tablet Take 2 tablets (17.2 mg total) by mouth 2 (two) times daily. 03/25/21   Jacinta Shoe, PA-C  tizanidine (ZANAFLEX) 2 MG capsule Take 2 mg by mouth 3 (three) times daily as needed for muscle spasms. 05/10/17   [provider]  topiramate (TOPAMAX) 100 MG tablet Take 100 mg by mouth daily. 01/14/18   [provider]  traZODone (DESYREL) 100 MG tablet Take 1 tablet (100 mg total) by mouth at bedtime. 01/12/20   [place], Peter M, MD  vitamin B-12 (CYANOCOBALAMIN) 1000 MCG tablet Take 1,000 mcg  by mouth daily.    [provider]  Vitamin D, Ergocalciferol, (DRISDOL) 50000 units CAPS capsule Take 50,000 Units by mouth every Friday.    [provider]     Family History  Problem Relation Age of Onset   Diabetes Mother    Heart disease Mother    Diabetes Father     Social History   Socioeconomic History   Marital status: Married    Spouse name: Not on file   Number of children: Not on file   Years of education: Not on file   Highest education level: Not on file  Occupational History   Not on file  Tobacco Use   Smoking status: Never   Smokeless tobacco: Never  Vaping Use   Vaping Use: Never used  Substance and Sexual Activity   Alcohol use: Yes    Comment: occasionally   Drug use: No   Sexual activity: Yes    Birth control/protection: Surgical  Other Topics Concern   Not on file  Social History Narrative   Not on file   Social Determinants of Health   Financial Resource Strain: Not on  file  Food Insecurity: Not on file  Transportation Needs: Not on file  Physical Activity: Not on file  Stress: Not on file  Social Connections: Not on file    Review of Systems: A 12 point ROS discussed and pertinent positives are indicated in the HPI above.  All other systems are negative.  Review of Systems  Constitutional:  Negative for fatigue and fever.  HENT:  Negative for congestion.   Respiratory:  Negative for cough and shortness of breath.   Gastrointestinal:  Negative for abdominal pain, diarrhea, nausea and vomiting.  Musculoskeletal:  Positive for arthralgias (left hip).  Skin:  Positive for wound ((s) on bilateral lower extermities.).   Vital Signs: There were no vitals taken for this visit.  Physical Exam Vitals and nursing note reviewed.  Constitutional:      Appearance: She is well-developed.  HENT:     Head: Normocephalic and atraumatic.  Eyes:     Conjunctiva/sclera: Conjunctivae normal.  Cardiovascular:     Rate and Rhythm: Regular rhythm.     Heart sounds: Normal heart sounds.  Pulmonary:     Effort: Pulmonary effort is normal.     Breath sounds: Normal breath sounds.  Musculoskeletal:     Cervical back: Normal range of motion.  Neurological:     Mental Status: She is alert and oriented to person, place, and time.    Imaging: MR HIP LEFT W WO CONTRAST  Result Date: 06/06/2021 CLINICAL DATA:  Septic arthritis suspected, hip, no prior imaging EXAM: MRI OF THE LEFT HIP WITHOUT AND WITH CONTRAST TECHNIQUE: Multiplanar, multisequence MR imaging was performed both before and after administration of intravenous contrast. CONTRAST:  35mL GADAVIST GADOBUTROL 1 MMOL/ML IV SOLN COMPARISON:  Hip radiograph 05/11/2021 FINDINGS: Bones: There is no evidence of acute fracture, dislocation or avascular necrosis of the left hip. There is incidentally noted in low-grade AVN of the right hip without collapse, partially visualized. No focal bone lesion. The visualized  sacroiliac joints and symphysis pubis appear normal. Articular cartilage and labrum Articular cartilage:  Mild chondrosis. Labrum: Degenerative superior labral tearing. Joint or bursal effusion Joint effusion: No significant hip joint effusion. Bursae: No focal periarticular fluid collection. Muscles and tendons Muscles and tendons: There is a rim enhancing intramuscular collection along the outer aspect of the gluteus minimus/medius muscles, measuring 1.5 x 1.2  cm in the axial dimension and 6.2 cm in craniocaudal extent, extending to the greater trochanter (axial postcontrast image 10, coronal postcontrast image 6). There is adjacent intramuscular edema and enhancement. Low-grade partial tearing of the distal gluteus medius tendon at the greater trochanter. Tendinosis with possible low-grade partial tearing of the proximal left hamstring tendons.The adductors are intact. There is intramuscular edema at the insertion of the vastus lateralis muscle. Other findings Miscellaneous: The visualized internal pelvic contents appear unremarkable. IMPRESSION: Rim enhancing intramuscular collection along the outer aspect of the gluteus minimus and medius muscles measuring 1.5 x 1.0 cm in the axial dimension and 6.2 cm in craniocaudal extent. Adjacent intramuscular edema enhancement. Reactive edema/inflammatory change within the proximal vastus lateralis. Low-grade partial tearing of the distal gluteus medius tendon at the greater trochanter. Findings are concerning for infectious myositis and/or myonecrosis. No evidence of septic arthritis. Mild left hip osteoarthritis with degenerative superior labral tearing. 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 These results will be called to the ordering clinician or representative by the Radiologist Assistant, and communication documented in the PACS or [employer] Dashboard. Electronically Signed   By: Caprice RenshawJacob  Kahn M.D.   On:  06/06/2021 13:04   MR FOOT LEFT W WO CONTRAST  Result Date: 06/06/2021 CLINICAL DATA:  Osteomyelitis, foot EXAM: MRI OF THE LEFT FOREFOOT WITHOUT AND WITH CONTRAST TECHNIQUE: Multiplanar, multisequence MR imaging of the left foot was performed both before and after administration of intravenous contrast. CONTRAST:  10mL GADAVIST GADOBUTROL 1 MMOL/ML IV SOLN COMPARISON:  Radiograph 10/08/2020 FINDINGS: Bones/Joint/Cartilage Postsurgical changes of transmetatarsal amputation. There are no findings to suggest osteomyelitis. There is no acute fracture. Ligaments The ATFL is intact. The calcaneofibular ligament intact. The PTFL is intact. The anterior posterior tibiofibular ligaments are intact. The deltoid and superomedial spring ligament are intact. Muscles and Tendons Diffuse muscle atrophy. The Achilles tendon demonstrates tendinosis and high-grade, nearly full-thickness split tearing just above the insertion. Soft tissues Generalized soft tissue swelling of the forefoot. There is no evidence of soft tissue abscess. IMPRESSION: Postsurgical changes of transmetatarsal amputation. No evidence of osteomyelitis or soft tissue abscess. Generalized soft tissue swelling of the forefoot. Tendinosis and high-grade, nearly full-thickness split tearing of the Achilles tendon just above the insertion on the calcaneus. Electronically Signed   By: Caprice RenshawJacob  Kahn M.D.   On: 06/06/2021 12:38    Labs:  CBC: Recent Labs    03/28/21 0511 03/29/21 0507 05/10/21 1009 06/01/21 1523  WBC 9.5 11.9* 9.1 11.0*  HGB 8.6* 9.1* 8.8* 9.5*  HCT 29.2* 31.3* 29.0* 32.2*  PLT 329 341 327 367    COAGS: Recent Labs    11/01/20 1152  INR 1.0  APTT 30    BMP: Recent Labs    03/25/21 0058 03/26/21 0254 03/27/21 0133 03/28/21 0511 05/10/21 1009 06/01/21 1523  NA 141 140 141 142 142 142  K 3.7 4.1 3.8 3.9 4.4 4.5  CL 115* 111 111 112* 112* 110  CO2 23 24 27 26 23 25   GLUCOSE 109* 91 100* 88 95 130*  BUN 6 5* 7 9 9 10    CALCIUM 8.1* 7.6* 8.0* 8.4* 8.3* 9.3  CREATININE 0.86 0.92 0.85 1.01* 1.03 1.04*  GFRNONAA >60 >60 >60 >60  --   --     LIVER FUNCTION TESTS: Recent Labs    11/01/20 1152 11/02/20 0528 03/24/21 1207 03/26/21 0254 05/10/21 1009 06/01/21 1523  BILITOT 0.6 0.6 0.7 0.5 0.2 0.2  AST 61*  38 22 18 18 12   ALT 34 29 16 14 11 11   ALKPHOS 102 103 60 61  --   --   PROT 5.8* 5.1* 4.9* 4.8* 5.4* 5.7*  ALBUMIN 2.5* 2.3* 2.8* 2.6*  --   --     Assessment and Plan:  55 y.o. female outpatient. History of DM, GERD,  peripheral neuropathy, osteomyelitis s/p left  transmetatarsal amputation. MR hip from 8.14.22 reads Rim enhancing intramuscular collection along the outer aspect of the gluteus minimus and medius muscles measuring 1.5 x 1.0 cm in the axial dimension and 6.2 cm in craniocaudal extent. team is requesting an aspiration of the gluteus minims abscess for further evaluation of septic arthritis.  Labs from 8.10.22 shows WBC 11.0All other and medications are within acceptable parameters. allergies include ibuprofen. Patient has been NPO since midnight.   Risks and benefits of hip aspiration was discussed with the patient and/or patient's family including, but not limited to bleeding, infection, damage to adjacent structures or low yield requiring additional tests.  All of the questions were answered and there is agreement to proceed.  Consent signed and in chart.   Thank you for this interesting consult.  I greatly enjoyed meeting Graceanna Theissen and look forward to participating in their care.  A copy of this report was sent to the requesting provider on this date.  Electronically Signed: 10.10.22, NP 06/20/2021, 12:33 PM   I spent a total of  30 Minutes   in face to face in clinical consultation, greater than 50% of which was counseling/coordinating care for hip aspiration

## 2021-06-20 NOTE — Discharge Instructions (Signed)
Urgent needs - Interventional Radiology on call MD [phone]  Wound - May remove dressing and shower in 24 to 48 hours.  Keep site clean and dry.  Replace with bandaid as needed.  Do not submerge in tub or water until site healing well. If closed with glue, glue will flake off on its own.    

## 2021-06-21 ENCOUNTER — Telehealth: Payer: Self-pay

## 2021-06-21 NOTE — Telephone Encounter (Signed)
Patient calling requesting results of  [place] left hip aspiration.   Preliminary results are in epic.   Routing to provider to review. Valarie Cones

## 2021-06-21 NOTE — Telephone Encounter (Signed)
Patient made aware  final culture results still currently pending.

## 2021-06-26 LAB — AEROBIC/ANAEROBIC CULTURE W GRAM STAIN (SURGICAL/DEEP WOUND): Culture: NO GROWTH

## 2021-07-01 ENCOUNTER — Ambulatory Visit (INDEPENDENT_AMBULATORY_CARE_PROVIDER_SITE_OTHER): Payer: BC Managed Care – PPO | Admitting: Infectious Disease

## 2021-07-01 ENCOUNTER — Encounter: Payer: Self-pay | Admitting: Infectious Disease

## 2021-07-01 ENCOUNTER — Other Ambulatory Visit: Payer: Self-pay

## 2021-07-01 VITALS — BP 122/80 | HR 63 | Temp 97.5°F | Resp 16 | Ht 66.0 in | Wt 200.0 lb

## 2021-07-01 DIAGNOSIS — I10 Essential (primary) hypertension: Secondary | ICD-10-CM | POA: Diagnosis not present

## 2021-07-01 DIAGNOSIS — A5002 Early congenital syphilitic osteochondropathy: Secondary | ICD-10-CM

## 2021-07-01 DIAGNOSIS — M609 Myositis, unspecified: Secondary | ICD-10-CM | POA: Insufficient documentation

## 2021-07-01 DIAGNOSIS — R6883 Chills (without fever): Secondary | ICD-10-CM

## 2021-07-01 DIAGNOSIS — M869 Osteomyelitis, unspecified: Secondary | ICD-10-CM | POA: Diagnosis not present

## 2021-07-01 DIAGNOSIS — M6289 Other specified disorders of muscle: Secondary | ICD-10-CM

## 2021-07-01 DIAGNOSIS — M313 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: Secondary | ICD-10-CM | POA: Diagnosis not present

## 2021-07-01 DIAGNOSIS — M9080 Osteopathy in diseases classified elsewhere, unspecified site: Secondary | ICD-10-CM

## 2021-07-01 DIAGNOSIS — S76012A Strain of muscle, fascia and tendon of left hip, initial encounter: Secondary | ICD-10-CM

## 2021-07-01 DIAGNOSIS — R109 Unspecified abdominal pain: Secondary | ICD-10-CM

## 2021-07-01 DIAGNOSIS — S76019A Strain of muscle, fascia and tendon of unspecified hip, initial encounter: Secondary | ICD-10-CM

## 2021-07-01 DIAGNOSIS — I96 Gangrene, not elsewhere classified: Secondary | ICD-10-CM

## 2021-07-01 HISTORY — DX: Strain of muscle, fascia and tendon of unspecified hip, initial encounter: S76.019A

## 2021-07-01 HISTORY — DX: Other specified disorders of muscle: M62.89

## 2021-07-01 HISTORY — DX: Gangrene, not elsewhere classified: I96

## 2021-07-01 NOTE — Progress Notes (Signed)
Subjective:  Chief complaint: Diffuse rash also still with pain in her hip but more so she is bothered by skin peeling at her amputation sites   Patient ID: [name], female    DOB: Jan 12, 1966, 55 y.o.   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 Victorino [name] on 24 March 2021.  Her operative cultures yielded ampicillin sensitive Enterococcus faecalis and Candida albicans.  The patient was placed on high-dose IV ampicillin along with 400 mg of fluconazole.  She was followed closely by my partner Dr. Luciana [name] and on 19 July she seems to be doing quite well and her osteomyelitis appear to be cured.  She was taken off IV antibiotics and fluconazole was stopped.  The following day she had developed severe left hip pain which brought her to the emergency department.  Plain films of the hip were unrevealing and she was given a corticosteroid taper. In the ensuing 2 weeks she has developed some drainage from her left amputation site laterally where she had prior osteomyelitis she tells me.  She also has had subjective fevers nausea malaise and is anxious that her infection is returned.  She has some left wrist pain as well but there is not much evidence for infection on exam there  I was concerned that she still had residual infection in her left foot and I restarted her on Augmentin and fluconazole.  I checked inflammatory markers which were actually normal.  I obtained an MRI of the left foot which I have reviewed and showed no evidence of osteomyelitis.  We also obtained an MRI of the left hip.  This did not  show evidence of septic arthritis which I was worried about.  It did show a rim-enhancing intramuscular collection deep to the gluteus minimus and medius muscles that was 1.5 x 1 cm and 6.2 cm in craniocaudal dimensions.  There was some adjacent muscular edema.  Radiology felt this could be either due to an infectious myositis versus a myonecrosis.  We had her the films reviewed by Dr. Victorino [name] and Dr. Charlann [name] who found no need for intervention surgically.  We took her off antibiotics and had interventional radiology aspirate the site and sent for culture cultures did not yield any organism.  She still has hip pain there but not with any worsening of pain she says she has chills and at times diaphoresis but she has not had measured fevers.  She also has left lower abdominal pain which comes and goes.  She is bothered by skin peeling at the sites of her amputation.  She is not currently not on medications for Wegener's and she is develop further skin lesions that sound likely to be due to her Wegener's.    Past Medical History:  Diagnosis Date   Chronic cough    Chronic pain    Diabetes mellitus without complication (HCC)    Dyspnea    GERD (gastroesophageal reflux disease)    Hypokalemia    Hypothyroidism    Left wrist pain 06/01/2021   Neurocardiogenic syncope    OSA (obstructive sleep apnea)    does not use CPAP   Osteomyelitis (HCC)    bilateral feet  Peripheral neuropathy    Presence of intrathecal pump    recieves Prialt/bupivicaine   Wegener's granulomatosis 2009   Wegner's disease (congenital syphilitic osteochondritis)     Past Surgical History:  Procedure Laterality Date   AMPUTATION Right 10/21/2020   Procedure: Right hallux amputation;  Surgeon: Stacey Arthurs, MD;  Location: [facility];  Service: Orthopedics;  Laterality: Right;   AMPUTATION TOE Bilateral 08/12/2020   Procedure: Right 3rd toe amputation; left hallux and 3rd toe amputation;  Surgeon: Stacey Arthurs, MD;  Location: [facility];  Service: Orthopedics;  Laterality: Bilateral;    AMPUTATION TOE Right 03/24/2021   Procedure: AMPUTATION TOE;  Surgeon: Stacey Arthurs, MD;  Location: [facility] OR;  Service: Orthopedics;  Laterality: Right;   APPENDECTOMY     BACK SURGERY     CHOLECYSTECTOMY     COLONOSCOPY N/A 06/02/2013   Procedure: COLONOSCOPY;  Surgeon: Graylin Shiver, MD;  Location: [facility];  Service: Endoscopy;  Laterality: N/A;   HERNIA REPAIR  2000   INCISION AND DRAINAGE ABSCESS Left 07/07/2016   Procedure: DEBRIDMENT LEFT THIGH ABSCESS, EXISION ACUTE SKIN RASH LEFT THIGH(1CM LESION);  Surgeon: Claud Kelp, MD;  Location: [facility];  Service: General;  Laterality: Left;   SEPTOPLASTY  02/2013   spleen anuyism     splenic aneurysm     TRANSMETATARSAL AMPUTATION Bilateral 10/21/2020   Procedure: Left transmetatarsal amputation;  Surgeon: Stacey Arthurs, MD;  Location: [facility];  Service: Orthopedics;  Laterality: Bilateral;   TRANSMETATARSAL AMPUTATION Left 03/24/2021   Procedure: TRANSMETATARSAL AMPUTATION;  Surgeon: Stacey Arthurs, MD;  Location: [facility] OR;  Service: Orthopedics;  Laterality: Left;   VAGINAL HYSTERECTOMY      Family History  Problem Relation Age of Onset   Diabetes Mother    Heart disease Mother    Diabetes Father       Social History   Socioeconomic History   Marital status: Married    Spouse name: Not on file   Number of children: Not on file   Years of education: Not on file   Highest education level: Not on file  Occupational History   Not on file  Tobacco Use   Smoking status: Never   Smokeless tobacco: Never  Vaping Use   Vaping Use: Never used  Substance and Sexual Activity   Alcohol use: Yes    Comment: occasionally   Drug use: No   Sexual activity: Yes    Birth control/protection: Surgical  Other Topics Concern   Not on file  Social History Narrative   Not on file   Social Determinants of Health    Financial Resource Strain: Not on file  Food Insecurity: Not on file  Transportation Needs: Not on file  Physical Activity: Not on file  Stress: Not on file  Social Connections: Not on file    Allergies  Allergen Reactions   Ibuprofen Other (See Comments)    Pt is unable to take this due to kidney problems.        Current Outpatient Medications:    acetaminophen (TYLENOL) 325 MG tablet, Take 2 tablets (650 mg total) by mouth every 6 (six) hours as needed for mild pain (or Fever >/= 101)., Disp: , Rfl:    albuterol (PROVENTIL HFA;VENTOLIN HFA) 108 (90 Base) MCG/ACT inhaler, Inhale 2 puffs into the lungs every 6 (six) hours as needed for wheezing or shortness of breath., Disp: , Rfl:    amoxicillin-clavulanate (AUGMENTIN) 875-125 MG tablet, Take  1 tablet by mouth 2 (two) times daily., Disp: 60 tablet, Rfl: 2   b complex vitamins tablet, Take 1 tablet by mouth daily., Disp: , Rfl:    Calcium Carb-Cholecalciferol (CALCIUM CARBONATE-VITAMIN D3 PO), Take 1 tablet by mouth daily., Disp: , Rfl:    docusate sodium (COLACE) 100 MG capsule, Take 1 capsule (100 mg total) by mouth 2 (two) times daily. While taking narcotic pain medicine., Disp: 30 capsule, Rfl: 0   DULoxetine (CYMBALTA) 60 MG capsule, Take 60 mg by mouth 2 (two) times daily., Disp: , Rfl:    famotidine (PEPCID) 20 MG tablet, Take 20 mg by mouth daily., Disp: , Rfl:    fluconazole (DIFLUCAN) 200 MG tablet, Take 2 tablets (400 mg total) by mouth daily., Disp: 60 tablet, Rfl: 2   folic acid (FOLVITE) 1 MG tablet, Take 2 mg by mouth daily., Disp: , Rfl:    furosemide (LASIX) 40 MG tablet, Take 1 tablet (40 mg total) by mouth as directed. (Patient taking differently: Take 40 mg by mouth daily.), Disp: 90 tablet, Rfl: 3   gabapentin (NEURONTIN) 300 MG capsule, Take 600-900 mg by mouth See admin instructions. Take 3 capsules (900 mg total) by mouth in the morning, 2 capsules (600 mg total) in the early afternoon, and 4 capsules (1200 mg  total) in the evening., Disp: , Rfl:    lidocaine (LIDODERM) 5 %, Place 1 patch onto the skin daily as needed (for pain). Remove & Discard patch within 12 hours or as directed by MD, Disp: , Rfl:    magnesium oxide (MAG-OX) 400 MG tablet, Take 400 mg by mouth 2 (two) times daily., Disp: , Rfl:    METHOTREXATE SODIUM IJ, Inject 25 mg into the skin every Sunday., Disp: , Rfl:    montelukast (SINGULAIR) 10 MG tablet, Take 10 mg by mouth daily., Disp: , Rfl: 3   NUCYNTA 75 MG tablet, Take 75 mg by mouth 4 (four) times daily. (Patient not taking: Reported on 06/01/2021), Disp: , Rfl:    oxyCODONE (OXY IR/ROXICODONE) 5 MG immediate release tablet, Take 5 mg by mouth 3 (three) times daily as needed., Disp: , Rfl:    oxyCODONE-acetaminophen (PERCOCET/ROXICET) 5-325 MG tablet, Take 1 tablet by mouth every 6 (six) hours as needed for severe pain. (Patient not taking: Reported on 06/01/2021), Disp: 10 tablet, Rfl: 0   Potassium Chloride Crys ER (K-DUR PO), Take 40 mEq by mouth every other day., Disp: , Rfl:    predniSONE (DELTASONE) 20 MG tablet, Take 1 tablet (20 mg total) by mouth 2 (two) times daily., Disp: 10 tablet, Rfl: 0   senna (SENOKOT) 8.6 MG TABS tablet, Take 2 tablets (17.2 mg total) by mouth 2 (two) times daily., Disp: 30 tablet, Rfl: 0   tizanidine (ZANAFLEX) 2 MG capsule, Take 2 mg by mouth 3 (three) times daily as needed for muscle spasms., Disp: , Rfl:    topiramate (TOPAMAX) 100 MG tablet, Take 100 mg by mouth daily., Disp: , Rfl:    traZODone (DESYREL) 100 MG tablet, Take 1 tablet (100 mg total) by mouth at bedtime., Disp:  , Rfl:    vitamin B-12 (CYANOCOBALAMIN) 1000 MCG tablet, Take 1,000 mcg by mouth daily., Disp: , Rfl:    Vitamin D, Ergocalciferol, (DRISDOL) 50000 units CAPS capsule, Take 50,000 Units by mouth every Friday., Disp: , Rfl:    Review of Systems  Constitutional:  Positive for chills, diaphoresis and fatigue. Negative for activity change, appetite change, fever and  unexpected weight  change.  HENT:  Negative for congestion, rhinorrhea, sinus pressure, sneezing, sore throat and trouble swallowing.   Eyes:  Negative for photophobia and visual disturbance.  Respiratory:  Negative for cough, chest tightness, shortness of breath, wheezing and stridor.   Cardiovascular:  Negative for chest pain, palpitations and leg swelling.  Gastrointestinal:  Negative for abdominal distention, abdominal pain, anal bleeding, blood in stool, constipation, diarrhea, nausea and vomiting.  Genitourinary:  Negative for difficulty urinating, dysuria, flank pain and hematuria.  Musculoskeletal:  Positive for myalgias. Negative for arthralgias, back pain, gait problem and joint swelling.  Skin:  Positive for rash and wound. Negative for color change and pallor.  Neurological:  Negative for dizziness, tremors, weakness and light-headedness.  Hematological:  Negative for adenopathy. Does not bruise/bleed easily.  Psychiatric/Behavioral:  Negative for agitation, behavioral problems, confusion, decreased concentration, dysphoric mood and sleep disturbance.       Objective:   Physical Exam Constitutional:      General: She is not in acute distress.    Appearance: Normal appearance. She is well-developed. She is not ill-appearing or diaphoretic.  HENT:     Head: Normocephalic and atraumatic.     Right Ear: Hearing and external ear normal.     Left Ear: Hearing and external ear normal.     Nose: No nasal deformity or rhinorrhea.  Eyes:     General: No scleral icterus.    Conjunctiva/sclera: Conjunctivae normal.     Right eye: Right conjunctiva is not injected.     Left eye: Left conjunctiva is not injected.     Pupils: Pupils are equal, round, and reactive to light.  Neck:     Vascular: No JVD.  Cardiovascular:     Rate and Rhythm: Normal rate and regular rhythm.     Heart sounds: S1 normal and S2 normal.  Pulmonary:     Effort: No respiratory distress.     Breath sounds: No  wheezing or rales.  Abdominal:     General: Bowel sounds are normal. There is no distension.     Palpations: Abdomen is soft.     Tenderness: There is no abdominal tenderness.  Musculoskeletal:        General: Normal range of motion.     Right shoulder: Normal.     Left shoulder: Normal.     Cervical back: Normal range of motion and neck supple.     Right hip: Normal.     Left hip: Normal.     Right knee: Normal.     Left knee: Normal.  Lymphadenopathy:     Head:     Right side of head: No submandibular, preauricular or posterior auricular adenopathy.     Left side of head: No submandibular, preauricular or posterior auricular adenopathy.     Cervical: No cervical adenopathy.     Right cervical: No superficial or deep cervical adenopathy.    Left cervical: No superficial or deep cervical adenopathy.  Skin:    General: Skin is warm and dry.     Coloration: Skin is not pale.     Findings: Rash present. No abrasion, bruising, ecchymosis, erythema or lesion.     Nails: There is no clubbing.  Neurological:     Mental Status: She is alert and oriented to person, place, and time.     Sensory: No sensory deficit.     Coordination: Coordination normal.     Gait: Gait normal.  Psychiatric:        Attention and  Perception: Attention normal. She is attentive.        Mood and Affect: Mood is anxious.        Speech: Speech normal.        Behavior: Behavior normal. Behavior is cooperative.        Thought Content: Thought content normal.        Cognition and Memory: Cognition and memory normal.        Judgment: Judgment normal.    Left foot with area where she says drainage was  happening from June 01, 2021:    Left foot today on July 01, 2021:      Right transmetatarsal amputation site June 01, 2021:     Right site 07/01/2021:     Cutaneous lesions or she has some ulcerations thought to be manifestations of her Wegener's vasculitis otitis June 01, 2021:    Lesions  July 01, 2021:             Assessment & Plan:   History of transmetatarsal amputations due to osteomyelitis: I have personally reviewed MRI which showed no evidence of osteomyelitis in her left foot where she said she was having drainage.  Not sure about what the significance is of the skin peeling but I do not find evidence for overt infection we will recheck CBC with differential CMP sed rate CRP today.  Will monitor off antibiotics  Left hip pain: I have personally reviewed MRI of hip which disclosed a muscle tear of the gluteus medius and a entry of muscular fluid collection this was aspirated and sent for culture which I have reviewed and did not yield an organism while she was off antibiotics.  Clinically this is not worsened.  Certainly this could either be a myonecrosis with a hematoma or it could be a myositis but I would think she would have worsened off antibiotics.  We will continue to monitor.  Wegener's: It seems to me that she should reinitiate some therapy for her Wegener's particular given how bothersome her skin lesions are I will defer to her rheumatologist for this.  Left lower quadrant pain: Not clear what is causing this she has had a history of gallstone pancreatitis and status postcholecystectomy.  We will check a CMP today with lipase.  Hypertension: well controlled  Vitals:   07/01/21 0848  BP: 122/80  Pulse: 63  Resp: 16  Temp: (!) 97.5 F (36.4 C)  SpO2: 99%

## 2021-07-02 LAB — COMPLETE METABOLIC PANEL WITH GFR
AG Ratio: 1.8 (calc) (ref 1.0–2.5)
ALT: 10 U/L (ref 6–29)
AST: 13 U/L (ref 10–35)
Albumin: 3.6 g/dL (ref 3.6–5.1)
Alkaline phosphatase (APISO): 67 U/L (ref 37–153)
BUN/Creatinine Ratio: 8 (calc) (ref 6–22)
BUN: 9 mg/dL (ref 7–25)
CO2: 25 mmol/L (ref 20–32)
Calcium: 9 mg/dL (ref 8.6–10.4)
Chloride: 111 mmol/L — ABNORMAL HIGH (ref 98–110)
Creat: 1.07 mg/dL — ABNORMAL HIGH (ref 0.50–1.03)
Globulin: 2 g/dL (calc) (ref 1.9–3.7)
Glucose, Bld: 93 mg/dL (ref 65–99)
Potassium: 4.4 mmol/L (ref 3.5–5.3)
Sodium: 145 mmol/L (ref 135–146)
Total Bilirubin: 0.2 mg/dL (ref 0.2–1.2)
Total Protein: 5.6 g/dL — ABNORMAL LOW (ref 6.1–8.1)
eGFR: 61 mL/min/{1.73_m2} (ref >=60)

## 2021-07-02 LAB — SEDIMENTATION RATE: Sed Rate: 31 mm/h — ABNORMAL HIGH (ref 0–30)

## 2021-07-02 LAB — CBC WITH DIFFERENTIAL/PLATELET
Absolute Monocytes: 566 cells/uL (ref 200–950)
Basophils Absolute: 61 cells/uL (ref 0–200)
Basophils Relative: 0.7 %
Eosinophils Absolute: 339 cells/uL (ref 15–500)
Eosinophils Relative: 3.9 %
HCT: 32.4 % — ABNORMAL LOW (ref 35.0–45.0)
Hemoglobin: 9.6 g/dL — ABNORMAL LOW (ref 11.7–15.5)
Lymphs Abs: 4628 cells/uL — ABNORMAL HIGH (ref 850–3900)
MCH: 23.8 pg — ABNORMAL LOW (ref 27.0–33.0)
MCHC: 29.6 g/dL — ABNORMAL LOW (ref 32.0–36.0)
MCV: 80.4 fL (ref 80.0–100.0)
MPV: 9.8 fL (ref 7.5–12.5)
Monocytes Relative: 6.5 %
Neutro Abs: 3106 cells/uL (ref 1500–7800)
Neutrophils Relative %: 35.7 %
Platelets: 404 10*3/uL — ABNORMAL HIGH (ref 140–400)
RBC: 4.03 10*6/uL (ref 3.80–5.10)
RDW: 17.2 % — ABNORMAL HIGH (ref 11.0–15.0)
Total Lymphocyte: 53.2 %
WBC: 8.7 10*3/uL (ref 3.8–10.8)

## 2021-07-02 LAB — C-REACTIVE PROTEIN: CRP: 23.8 mg/L — ABNORMAL HIGH (ref <8.0)

## 2021-07-02 LAB — LIPASE: Lipase: 25 U/L (ref 7–60)

## 2021-07-14 ENCOUNTER — Encounter (HOSPITAL_BASED_OUTPATIENT_CLINIC_OR_DEPARTMENT_OTHER): Payer: BC Managed Care – PPO | Admitting: Internal Medicine

## 2021-07-26 ENCOUNTER — Emergency Department (HOSPITAL_COMMUNITY)
Admission: EM | Admit: 2021-07-26 | Discharge: 2021-07-26 | Disposition: A | Discharge status: Home / Self Care | Payer: BC Managed Care – PPO | Attending: Emergency Medicine | Admitting: Emergency Medicine

## 2021-07-26 ENCOUNTER — Other Ambulatory Visit: Payer: Self-pay

## 2021-07-26 ENCOUNTER — Encounter (HOSPITAL_COMMUNITY): Payer: Self-pay

## 2021-07-26 DIAGNOSIS — Z8616 Personal history of COVID-19: Secondary | ICD-10-CM | POA: Insufficient documentation

## 2021-07-26 DIAGNOSIS — R197 Diarrhea, unspecified: Secondary | ICD-10-CM | POA: Diagnosis not present

## 2021-07-26 DIAGNOSIS — D72829 Elevated white blood cell count, unspecified: Secondary | ICD-10-CM | POA: Insufficient documentation

## 2021-07-26 DIAGNOSIS — R5381 Other malaise: Secondary | ICD-10-CM | POA: Diagnosis not present

## 2021-07-26 DIAGNOSIS — R109 Unspecified abdominal pain: Secondary | ICD-10-CM | POA: Insufficient documentation

## 2021-07-26 DIAGNOSIS — N183 Chronic kidney disease, stage 3 unspecified: Secondary | ICD-10-CM | POA: Insufficient documentation

## 2021-07-26 DIAGNOSIS — K921 Melena: Secondary | ICD-10-CM | POA: Diagnosis not present

## 2021-07-26 DIAGNOSIS — I959 Hypotension, unspecified: Secondary | ICD-10-CM | POA: Insufficient documentation

## 2021-07-26 DIAGNOSIS — R001 Bradycardia, unspecified: Secondary | ICD-10-CM | POA: Diagnosis not present

## 2021-07-26 DIAGNOSIS — Z79899 Other long term (current) drug therapy: Secondary | ICD-10-CM | POA: Insufficient documentation

## 2021-07-26 DIAGNOSIS — E039 Hypothyroidism, unspecified: Secondary | ICD-10-CM | POA: Diagnosis not present

## 2021-07-26 DIAGNOSIS — I129 Hypertensive chronic kidney disease with stage 1 through stage 4 chronic kidney disease, or unspecified chronic kidney disease: Secondary | ICD-10-CM | POA: Insufficient documentation

## 2021-07-26 DIAGNOSIS — E1122 Type 2 diabetes mellitus with diabetic chronic kidney disease: Secondary | ICD-10-CM | POA: Insufficient documentation

## 2021-07-26 LAB — CBC WITH DIFFERENTIAL/PLATELET
Abs Immature Granulocytes: 0.08 10*3/uL — ABNORMAL HIGH (ref 0.00–0.07)
Basophils Absolute: 0.1 10*3/uL (ref 0.0–0.1)
Basophils Relative: 1 %
Eosinophils Absolute: 0.3 10*3/uL (ref 0.0–0.5)
Eosinophils Relative: 2 %
HCT: 32.9 % — ABNORMAL LOW (ref 36.0–46.0)
Hemoglobin: 9.5 g/dL — ABNORMAL LOW (ref 12.0–15.0)
Immature Granulocytes: 1 %
Lymphocytes Relative: 30 %
Lymphs Abs: 3.4 10*3/uL (ref 0.7–4.0)
MCH: 24.9 pg — ABNORMAL LOW (ref 26.0–34.0)
MCHC: 28.9 g/dL — ABNORMAL LOW (ref 30.0–36.0)
MCV: 86.1 fL (ref 80.0–100.0)
Monocytes Absolute: 0.5 10*3/uL (ref 0.1–1.0)
Monocytes Relative: 4 %
Neutro Abs: 6.9 10*3/uL (ref 1.7–7.7)
Neutrophils Relative %: 62 %
Platelets: 277 10*3/uL (ref 150–400)
RBC: 3.82 MIL/uL — ABNORMAL LOW (ref 3.87–5.11)
RDW: 19.1 % — ABNORMAL HIGH (ref 11.5–15.5)
WBC: 11.1 10*3/uL — ABNORMAL HIGH (ref 4.0–10.5)
nRBC: 0 % (ref 0.0–0.2)

## 2021-07-26 LAB — COMPREHENSIVE METABOLIC PANEL
ALT: 22 U/L (ref 0–44)
AST: 25 U/L (ref 15–41)
Albumin: 3.5 g/dL (ref 3.5–5.0)
Alkaline Phosphatase: 67 U/L (ref 38–126)
Anion gap: 7 (ref 5–15)
BUN: 13 mg/dL (ref 6–20)
CO2: 24 mmol/L (ref 22–32)
Calcium: 8.9 mg/dL (ref 8.9–10.3)
Chloride: 109 mmol/L (ref 98–111)
Creatinine, Ser: 1.25 mg/dL — ABNORMAL HIGH (ref 0.44–1.00)
GFR, Estimated: 51 mL/min — ABNORMAL LOW (ref >60)
Glucose, Bld: 136 mg/dL — ABNORMAL HIGH (ref 70–99)
Potassium: 3.6 mmol/L (ref 3.5–5.1)
Sodium: 140 mmol/L (ref 135–145)
Total Bilirubin: 0.4 mg/dL (ref 0.3–1.2)
Total Protein: 6.2 g/dL — ABNORMAL LOW (ref 6.5–8.1)

## 2021-07-26 LAB — TYPE AND SCREEN
ABO/RH(D): O POS
Antibody Screen: NEGATIVE

## 2021-07-26 MED ORDER — SODIUM CHLORIDE 0.9 % IV BOLUS
1000.0000 mL | Freq: Once | INTRAVENOUS | Status: AC
  Administered 2021-07-26: 1000 mL via INTRAVENOUS

## 2021-07-26 MED ORDER — HYDROCORTISONE SOD SUC (PF) 100 MG IJ SOLR
100.0000 mg | Freq: Once | INTRAMUSCULAR | Status: AC
  Administered 2021-07-26: 100 mg via INTRAVENOUS
  Filled 2021-07-26: qty 2

## 2021-07-26 NOTE — ED Triage Notes (Signed)
Pt. Arrives via POV. Pt was seen by their PCP. Pt. Was advised by their PCP to come to the ED for hypotension and a low HR.

## 2021-07-26 NOTE — ED Provider Notes (Signed)
[name] EMERGENCY DEPARTMENT Provider Note   CSN: [id number] Arrival date & time: 07/26/21  1315     History Chief Complaint  Patient presents with   Hypotension    [name] is a 55 y.o. female.  HPI She presents for evaluation of abdominal pain with diarrhea, and hematochezia, ongoing for a week.  She has "chronic diarrhea," and states today she saw her GI doctor who directed her to come here.  Per the EMR, her blood pressure at 1105 this morning was 86/58 with a heart rate of 40.  There is no detailed note regarding the evaluation, there today. Husband reports that she has chronic diarrhea, multiple times each day, and that today she went to see her GI doctor for her usual appointment.  They became concerned about the low heart rate and blood pressure so sent her here.  She took her usual medications this morning including her steroids.  She has not had any recent vomiting, fever, focal weakness.  Her husband states that she is "always tired.  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  There are no other known active modifying factors    Past Medical History:  Diagnosis Date   Chronic cough    Chronic pain    Diabetes mellitus without complication (HCC)    Dyspnea    GERD (gastroesophageal reflux disease)    Hypokalemia    Hypothyroidism    Left wrist pain 06/01/2021   Myonecrosis (HCC) 07/01/2021   Neurocardiogenic syncope    OSA (obstructive sleep apnea)    does not use CPAP   Osteomyelitis (HCC)    bilateral feet   Peripheral neuropathy    Presence of intrathecal pump    recieves Prialt/bupivicaine   Tear of gluteus medius tendon 07/01/2021   Wegener's granulomatosis 2009   Wegner's disease (congenital syphilitic osteochondritis)     Patient Active Problem List   Diagnosis Date Noted   Myonecrosis (HCC) 07/01/2021   Tear of gluteus medius tendon 07/01/2021   Left wrist pain 06/01/2021   Medication  monitoring encounter 05/10/2021   Fever and chills 04/22/2021   Osteomyelitis (HCC) 03/24/2021   Chronic pain 03/24/2021   COVID-19 virus infection 11/01/2020   Diarrhea 11/01/2020   Dehydration 11/01/2020   Generalized abdominal pain 10/09/2018   Midline low back pain 09/11/2018   Malfunction of intrathecal infusion pump 09/11/2018   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   Steroid-induced hyperglycemia 05/11/2018   Weakness 05/02/2018   Morbid obesity (HCC) 01/25/2018   Neuropathy 01/25/2018   Presence of intrathecal pump 08/02/2017   Rectocele 03/09/2017   Urinary hesitancy 03/09/2017   Abnormal finding on imaging 12/19/2016   Esophageal dysphagia 12/19/2016   Current chronic use of systemic steroids 10/26/2016   IFG (impaired fasting glucose) 10/26/2016   Arthralgia of multiple joints 07/15/2016   Headache 07/15/2016   Maculopapular rash, generalized 07/15/2016   Peripheral neuropathy 07/04/2016   Rash 07/04/2016   CKD (chronic kidney disease) stage 3, GFR 30-59 ml/min (HCC) 07/04/2016   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   Neuropathic pain of both feet 01/11/2016   OSA (obstructive sleep apnea) 12/29/2015   Chronic insomnia 12/29/2015   Mixed hyperlipidemia 12/29/2015   Obesity, Class II, BMI 35-39.9, with comorbidity 12/29/2015   Polyneuropathy associated with underlying disease (HCC) 12/29/2015   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) 12/29/2015   Class 2 drug-induced obesity with serious comorbidity and body mass index (BMI) of 35.0  to 35.9 in adult 12/29/2015   Other mechanical complication of implanted electronic neurostimulator of spinal cord electrode (lead), initial encounter (HCC) 08/04/2015   Adjustment disorder with depressed mood 03/29/2015   Hyperlipidemia 07/20/2014   Mononeuritis 07/20/2014   Obesity (BMI 30-39.9) 07/20/2014   Essential hypertension 07/20/2014   Neurocardiogenic syncope 07/06/2014   Normocytic  anemia 06/08/2014   POTS (postural orthostatic tachycardia syndrome) 06/08/2014   Hypotension 06/03/2014   Anal fissure 09/16/2013   Adrenal insufficiency (HCC) 05/30/2013   Encounter for long-term (current) use of high-risk medication 05/30/2013   Fall 05/30/2013   Hemorrhage of rectum and anus 05/30/2013   Other long term (current) drug therapy 05/30/2013   Syncope and collapse 05/30/2013   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/30/2013   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/2013   Congenital syphilitic osteochondritis    Gastroesophageal reflux disease without esophagitis 03/07/2012   Other diseases of trachea and bronchus 08/30/2011   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   Atrophy of nasal turbinates 07/25/2011   Nasal septal perforation 07/25/2011   Cough 03/24/2011   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   Subglottic stenosis 03/24/2011    Past Surgical History:  Procedure Laterality Date   AMPUTATION Right 10/21/2020   Procedure: Right hallux amputation;  Surgeon: Toni Arthurs, MD;  Location: [facility];  Service: Orthopedics;  Laterality: Right;   AMPUTATION TOE Bilateral 08/12/2020   Procedure: Right 3rd toe amputation; left hallux and 3rd toe amputation;  Surgeon: Toni Arthurs, MD;  Location: [facility];  Service: Orthopedics;  Laterality: Bilateral;    AMPUTATION TOE Right 03/24/2021   Procedure: AMPUTATION TOE;  Surgeon: Toni Arthurs, MD;  Location: [facility] OR;  Service: Orthopedics;  Laterality: Right;   APPENDECTOMY     BACK SURGERY     CHOLECYSTECTOMY     COLONOSCOPY N/A 06/02/2013   Procedure: COLONOSCOPY;  Surgeon: Graylin Shiver, MD;  Location: [facility];  Service: Endoscopy;  Laterality: N/A;   HERNIA REPAIR  2000   INCISION AND DRAINAGE ABSCESS Left 07/07/2016   Procedure: DEBRIDMENT LEFT THIGH ABSCESS, EXISION ACUTE SKIN RASH LEFT THIGH(1CM LESION);  Surgeon: Claud Kelp, MD;  Location: [facility];  Service: General;   Laterality: Left;   SEPTOPLASTY  02/2013   spleen anuyism     splenic aneurysm     TRANSMETATARSAL AMPUTATION Bilateral 10/21/2020   Procedure: Left transmetatarsal amputation;  Surgeon: Toni Arthurs, MD;  Location: [facility];  Service: Orthopedics;  Laterality: Bilateral;   TRANSMETATARSAL AMPUTATION Left 03/24/2021   Procedure: TRANSMETATARSAL AMPUTATION;  Surgeon: Toni Arthurs, MD;  Location: [facility] OR;  Service: Orthopedics;  Laterality: Left;   VAGINAL HYSTERECTOMY       OB History   No obstetric history on file.     Family History  Problem Relation Age of Onset   Diabetes Mother    Heart disease Mother    Diabetes Father     Social History   Tobacco Use   Smoking status: Never   Smokeless tobacco: Never  Vaping Use   Vaping Use: Never used  Substance Use Topics   Alcohol use: Yes    Comment: occasionally   Drug use: No    Home Medications Prior to Admission medications   Medication Sig Start Date End Date Taking? Authorizing Provider  acetaminophen (TYLENOL) 325 MG tablet Take 2 tablets (650 mg total) by mouth every 6 (six) hours as needed for mild pain (or Fever >/= 101). 07/12/16  Yes Joseph Art, DO  albuterol (PROVENTIL HFA;VENTOLIN HFA) 108 (90 Base) MCG/ACT inhaler Inhale 2 puffs into the lungs every 6 (six) hours as needed for wheezing or shortness of breath.   Yes [provider]  b complex vitamins tablet Take 1 tablet by mouth daily.   Yes [provider]  Calcium Carb-Cholecalciferol (CALCIUM CARBONATE-VITAMIN D3 PO) Take 1 tablet by mouth daily.   Yes [provider]  docusate sodium (COLACE) 100 MG capsule Take 1 capsule (100 mg total) by mouth 2 (two) times daily. While taking narcotic pain medicine. 03/25/21  Yes Jacinta Shoe, PA-C  DULoxetine (CYMBALTA) 60 MG capsule Take 60 mg by mouth daily.   Yes [provider]  famotidine (PEPCID) 20 MG tablet Take 20 mg by mouth daily. 01/12/20  Yes [place],  Peter M, MD  folic acid (FOLVITE) 1 MG tablet Take 2 mg by mouth daily.   Yes [provider]  furosemide (LASIX) 40 MG tablet Take 1 tablet (40 mg total) by mouth as directed. Patient taking differently: Take 40 mg by mouth daily. 09/06/20  Yes [place], Peter M, MD  gabapentin (NEURONTIN) 300 MG capsule Take 600-900 mg by mouth See admin instructions. Take 3 capsules (900 mg total) by mouth in the morning, 2 capsule and 4 capsules (1200 mg total) in the evening.   Yes [provider]  lidocaine (LIDODERM) 5 % Place 1 patch onto the skin daily as needed (for pain). Remove & Discard patch within 12 hours or as directed by MD   Yes [provider]  METHOTREXATE SODIUM IJ Inject 25 mg into the skin every Sunday.   Yes [provider]  oxyCODONE (OXY IR/ROXICODONE) 5 MG immediate release tablet Take 5 mg by mouth 3 (three) times daily as needed. 04/07/21  Yes [provider]  propranolol (INDERAL) 40 MG tablet Take 40 mg by mouth daily. 07/21/21  Yes [provider]  sulfamethoxazole-trimethoprim (BACTRIM) 400-80 MG tablet Take 1 tablet by mouth 3 (three) times a week. Monday,Wednesday and Friday 07/13/21  Yes [provider]  tizanidine (ZANAFLEX) 2 MG capsule Take 2 mg by mouth at bedtime. 05/10/17  Yes [provider]  topiramate (TOPAMAX) 100 MG tablet Take 100 mg by mouth daily. 01/14/18  Yes [provider]  traZODone (DESYREL) 100 MG tablet Take 1 tablet (100 mg total) by mouth at bedtime. 01/12/20  Yes [place], Peter M, MD  vitamin B-12 (CYANOCOBALAMIN) 1000 MCG tablet Take 1,000 mcg by mouth daily.   Yes [provider]  Vitamin D, Ergocalciferol, (DRISDOL) 50000 units CAPS capsule Take 50,000 Units by mouth every Friday.   Yes [provider]  amoxicillin-clavulanate (AUGMENTIN) 875-125 MG tablet Take 1 tablet by mouth 2 (two) times daily. Patient not taking: No sig reported 06/01/21   Daiva Eves, Lisette Grinder, MD  calcium carbonate (OSCAL) 1500 (600 Ca) MG TABS tablet Take by mouth. Patient not taking: Reported on 07/26/2021    [provider]  DULoxetine (CYMBALTA) 60 MG capsule Take by mouth. Patient not taking: Reported on 07/26/2021    [provider]  fluconazole (DIFLUCAN) 200 MG tablet Take 2 tablets (400 mg total) by mouth daily. Patient not taking: Reported on 07/26/2021 06/01/21   Daiva Eves, Lisette Grinder, MD  Lifitegrast Benay Spice) 5 % SOLN Place 1 drop into both eyes 2 (two) times daily. Patient not taking: Reported on 07/26/2021 05/18/21   [provider]  magnesium oxide (MAG-OX) 400 MG tablet Take 400 mg by mouth 2 (two)  times daily. Patient not taking: Reported on 07/26/2021    [provider]  montelukast (SINGULAIR) 10 MG tablet Take 10 mg by mouth daily. Patient not taking: Reported on 07/26/2021 12/30/17   [provider]  NUCYNTA 75 MG tablet Take 75 mg by mouth 4 (four) times daily. Patient not taking: No sig reported 03/18/21   [provider]  oxyCODONE-acetaminophen (PERCOCET/ROXICET) 5-325 MG tablet Take 1 tablet by mouth every 6 (six) hours as needed for severe pain. Patient not taking: No sig reported 05/11/21   Mancel Bale, MD  pantoprazole (PROTONIX) 40 MG tablet Take 40 mg by mouth daily. Patient not taking: Reported on 07/26/2021 07/01/21   [provider]  Potassium Chloride Crys ER (K-DUR PO) Take 40 mEq by mouth every other day. Patient not taking: Reported on 07/26/2021    [provider]  predniSONE (DELTASONE) 20 MG tablet Take 1 tablet (20 mg total) by mouth 2 (two) times daily. Patient taking differently: Take 5 mg by mouth daily. 05/11/21   Mancel Bale, MD  senna (SENOKOT) 8.6 MG TABS tablet Take 2 tablets (17.2 mg total) by mouth 2 (two) times daily. Patient not taking: Reported on 07/26/2021 03/25/21   Jacinta Shoe, PA-C  tiZANidine (ZANAFLEX) 2 MG tablet Take 2 mg by mouth 3 (three) times  daily as needed. Patient not taking: Reported on 07/26/2021 06/07/21   [provider]    Allergies    Ibuprofen and Penicillins  Review of Systems   Review of Systems  All other systems reviewed and are negative.  Physical Exam Updated Vital Signs BP 112/60   Pulse 62   Temp 97.7 F (36.5 C) (Oral)   Resp (!) 22   Ht  (1.676 m)   Wt 86.2 kg   SpO2 97%   BMI 30.67 kg/m   Physical Exam Vitals and nursing note reviewed.  Constitutional:      General: She is not in acute distress.    Appearance: She is well-developed. She is not ill-appearing, toxic-appearing or diaphoretic.  HENT:     Head: Normocephalic and atraumatic.     Right Ear: External ear normal.     Left Ear: External ear normal.     Nose: No congestion.     Mouth/Throat:     Pharynx: No oropharyngeal exudate.  Eyes:     Conjunctiva/sclera: Conjunctivae normal.     Pupils: Pupils are equal, round, and reactive to light.  Neck:     Trachea: Phonation normal.  Cardiovascular:     Rate and Rhythm: Normal rate and regular rhythm.     Heart sounds: Normal heart sounds.  Pulmonary:     Effort: Pulmonary effort is normal. No respiratory distress.     Breath sounds: Normal breath sounds. No stridor.  Abdominal:     General: There is no distension.     Palpations: Abdomen is soft.     Tenderness: There is no abdominal tenderness.  Musculoskeletal:        General: Normal range of motion.     Cervical back: Normal range of motion and neck supple.  Skin:    General: Skin is warm and dry.  Neurological:     Mental Status: She is alert and oriented to person, place, and time.     Cranial Nerves: No cranial nerve deficit.     Sensory: No sensory deficit.     Motor: No abnormal muscle tone.     Coordination: Coordination normal.  Psychiatric:  Mood and Affect: Mood normal.        Behavior: Behavior normal.        Thought Content: Thought content normal.        Judgment: Judgment normal.     ED Results / Procedures / Treatments   Labs (all labs ordered are listed, but only abnormal results are displayed) Labs Reviewed  CBC WITH DIFFERENTIAL/PLATELET - Abnormal; Notable for the following components:      Result Value   WBC 11.1 (*)    RBC 3.82 (*)    Hemoglobin 9.5 (*)    HCT 32.9 (*)    MCH 24.9 (*)    MCHC 28.9 (*)    RDW 19.1 (*)    Abs Immature Granulocytes 0.08 (*)    All other components within normal limits  COMPREHENSIVE METABOLIC PANEL - Abnormal; Notable for the following components:   Glucose, Bld 136 (*)    Creatinine, Ser 1.25 (*)    Total Protein 6.2 (*)    GFR, Estimated 51 (*)    All other components within normal limits  TYPE AND SCREEN    EKG EKG Interpretation  Date/Time:  Tuesday July 26 2021 16:59:53 EDT Ventricular Rate:  45 PR Interval:  191 QRS Duration: 102 QT Interval:  480 QTC Calculation: 416 R Axis:   -9 Text Interpretation: Sinus bradycardia Atrial premature complex Abnormal R-wave progression, early transition Confirmed by Pricilla Loveless [phone]) on 07/27/2021 9:29:41 AM    Date: 07/26/2021  Rate: 45  Rhythm: sinus bradycardia  QRS Axis: normal  PR and QT Intervals: normal  ST/T Wave abnormalities: normal  PR and QRS Conduction Disutrbances:none   Radiology No results found.  Procedures .Critical Care Performed by: Mancel Bale, MD Authorized by: Mancel Bale, MD   Critical care provider statement:    Critical care time (minutes):  35   Critical care start time:  07/26/2021 3:28 PM   Critical care end time:  07/26/2021 9:00 PM   Critical care time was exclusive of:  Separately billable procedures and treating other patients   Critical care was necessary to treat or prevent imminent or life-threatening deterioration of the following conditions:  Circulatory failure   Critical care was time spent personally by me on the following activities:  Blood draw for specimens, development of treatment plan with  patient or surrogate, discussions with consultants, evaluation of patient's response to treatment, examination of patient, obtaining history from patient or surrogate, ordering and performing treatments and interventions, ordering and review of laboratory studies, pulse oximetry, re-evaluation of patient's condition, review of old charts and ordering and review of radiographic studies   Medications Ordered in ED Medications  sodium chloride 0.9 % bolus 1,000 mL (0 mLs Intravenous Stopped 07/26/21 1836)  hydrocortisone sodium succinate (SOLU-CORTEF) 100 MG injection 100 mg (100 mg Intravenous Given 07/26/21 1708)    ED Course  I have reviewed the triage vital signs and the nursing notes.  Pertinent labs & imaging results that were available during my care of the patient were reviewed by me and considered in my medical decision making (see chart for details).    MDM Rules/Calculators/A&P                            Patient Vitals for the past 24 hrs:  BP Temp Temp src Pulse Resp SpO2 Height Weight  07/26/21 2000 112/60 -- -- 62 (!) 22 97 % -- --  07/26/21 1930 (!) 115/53 -- -- Marland Kitchen  57 16 98 % -- --  07/26/21 1900 (!) 145/77 -- -- (!) 56 (!) 22 100 % -- --  07/26/21 1830 117/66 -- -- (!) 51 13 98 % -- --  07/26/21 1800 103/65 -- -- (!) 55 18 100 % -- --  07/26/21 1659 111/80 97.7 F (36.5 C) Oral (!) 51 14 100 % -- --  07/26/21 1624 107/74 97.9 F (36.6 C) Oral (!) 45 15 100 % -- --  07/26/21 1355 (!) 96/56 98.1 F (36.7 C) Oral (!) 44 15 100 % 5\' 6"  (1.676 m) 86.2 kg    8:15 PM- reevaluation with update and discussion. After initial assessment and treatment, an updated evaluation reveals she is alert and comfortable.  Blood pressure and heart rate both normalized.  Findings discussed with patient and her husband, all questions were answered.   Medical Decision Making:  This patient is presenting for evaluation of hypotension and bradycardia, which does require a range of  treatment options, and is a complaint that involves a high risk of morbidity and mortality. The differential diagnoses include volume depletion, complications of adrenal insufficiency, acute illness including infection and metabolic disorder. I decided to review old records, and in summary middle-aged female with adrenal insufficiency, and chronic diarrhea, presenting after brief evaluation from her GI office.  She has chronic pain, diabetes, sleep apnea..  I did not require additional historical information from anyone.  Clinical Laboratory Tests Ordered, included CBC and Metabolic panel. Review indicates normal except glucose high, creatinine high, total protein low, white count high, hemoglobin low.   Critical Interventions-clinical evaluation, laboratory testing, IV fluids, medication treatment, observation and reevaluation  After Interventions, the Patient was reevaluated and was found improved, near baseline.  Possible mild AKI with slightly bumped creatinine which has been treated with IV fluids here.  Patient stable for discharge.  She has ongoing chronic symptoms that can be managed as an outpatient.  There is no indication for hospitalization at this time.  After treatment with hydrocortisone status improved and she is not vomiting.  CRITICAL CARE- yes Performed by: Mancel Bale  Nursing Notes Reviewed/ Care Coordinated Applicable Imaging Reviewed Interpretation of Laboratory Data incorporated into ED treatment  The patient appears reasonably screened and/or stabilized for discharge and I doubt any other medical condition or other [facility] requiring further screening, evaluation, or treatment in the ED at this time prior to discharge.  Plan: Home Medications-continue usual; Home Treatments-regular diet, fluids and activity; return here if the recommended treatment, does not improve the symptoms; Recommended follow up-PCP follow-up 1 week and as needed     Final Clinical Impression(s)  / ED Diagnoses Final diagnoses:  Malaise  Hypotension, unspecified hypotension type  Bradycardia    Rx / DC Orders ED Discharge Orders     None        [facility], MD 07/27/21 1110

## 2021-07-26 NOTE — Discharge Instructions (Addendum)
The testing today is reassuring.  Your heart rate and blood pressure both improved.  Continue taking your usual medicines.  Make sure you are eating and drinking well to support your vital signs being better.  Follow-up with your doctors as currently planned and needed

## 2021-07-26 NOTE — ED Notes (Addendum)
Pt. States they just recently took amoxicillin and had a rash around their mouth. Pt. States they believe penicillin should be marked as an allergy. Pt. States they have had blood in their stool since yesterday. Pt. States it has been bright red blood.

## 2021-08-01 DIAGNOSIS — Z89429 Acquired absence of other toe(s), unspecified side: Secondary | ICD-10-CM | POA: Insufficient documentation

## 2021-08-05 ENCOUNTER — Ambulatory Visit (HOSPITAL_COMMUNITY): Payer: BC Managed Care – PPO | Admitting: Physical Therapy

## 2021-08-10 ENCOUNTER — Ambulatory Visit (HOSPITAL_COMMUNITY): Payer: BC Managed Care – PPO

## 2021-08-15 ENCOUNTER — Ambulatory Visit (HOSPITAL_COMMUNITY): Payer: BC Managed Care – PPO | Attending: Nurse Practitioner | Admitting: Physical Therapy

## 2021-08-15 DIAGNOSIS — S91301A Unspecified open wound, right foot, initial encounter: Secondary | ICD-10-CM | POA: Insufficient documentation

## 2021-08-15 DIAGNOSIS — S91302A Unspecified open wound, left foot, initial encounter: Secondary | ICD-10-CM | POA: Insufficient documentation

## 2021-08-15 DIAGNOSIS — R262 Difficulty in walking, not elsewhere classified: Secondary | ICD-10-CM | POA: Insufficient documentation

## 2021-08-18 ENCOUNTER — Encounter (HOSPITAL_COMMUNITY): Payer: Self-pay | Admitting: Physical Therapy

## 2021-08-18 ENCOUNTER — Other Ambulatory Visit: Payer: Self-pay

## 2021-08-18 ENCOUNTER — Ambulatory Visit (HOSPITAL_COMMUNITY): Payer: BC Managed Care – PPO | Admitting: Physical Therapy

## 2021-08-18 DIAGNOSIS — R262 Difficulty in walking, not elsewhere classified: Secondary | ICD-10-CM

## 2021-08-18 DIAGNOSIS — S91302A Unspecified open wound, left foot, initial encounter: Secondary | ICD-10-CM | POA: Diagnosis present

## 2021-08-18 DIAGNOSIS — S91301A Unspecified open wound, right foot, initial encounter: Secondary | ICD-10-CM | POA: Diagnosis present

## 2021-08-18 NOTE — Therapy (Signed)
[facility] [address] [place], [place], [address] Phone: [phone]   Fax:  [phone]  Wound Care Evaluation  Patient Details  Name: Chandria Rookstool MRN: [id number] Date of Birth: Sep 01, 1966 Referring Provider (PT): Aleatha Borer   Encounter Date: 08/18/2021   PT End of Session - 08/18/21 1035     Visit Number 1    Number of Visits 16    Date for PT Re-Evaluation 10/13/21    Authorization Type [employer] (no auth, No VL)    Progress Note Due on Visit 10    PT Start Time 0950    PT Stop Time 1030    PT Time Calculation (min) 40 min    Activity Tolerance Patient tolerated treatment well    Behavior During Therapy [facility] for tasks assessed/performed             Past Medical History:  Diagnosis Date   Chronic cough    Chronic pain    Diabetes mellitus without complication (HCC)    Dyspnea    GERD (gastroesophageal reflux disease)    Hypokalemia    Hypothyroidism    Left wrist pain 06/01/2021   Myonecrosis (HCC) 07/01/2021   Neurocardiogenic syncope    OSA (obstructive sleep apnea)    does not use CPAP   Osteomyelitis (HCC)    bilateral feet   Peripheral neuropathy    Presence of intrathecal pump    recieves Prialt/bupivicaine   Tear of gluteus medius tendon 07/01/2021   Wegener's granulomatosis 2009   Wegner's disease (congenital syphilitic osteochondritis)     Past Surgical History:  Procedure Laterality Date   AMPUTATION Right 10/21/2020   Procedure: Right hallux amputation;  Surgeon: Toni Arthurs, MD;  Location: [facility];  Service: Orthopedics;  Laterality: Right;   AMPUTATION TOE Bilateral 08/12/2020   Procedure: Right 3rd toe amputation; left hallux and 3rd toe amputation;  Surgeon: Toni Arthurs, MD;  Location: [facility];  Service: Orthopedics;  Laterality: Bilateral;    AMPUTATION TOE Right 03/24/2021   Procedure: AMPUTATION TOE;  Surgeon: Toni Arthurs, MD;  Location: [facility] OR;   Service: Orthopedics;  Laterality: Right;   APPENDECTOMY     BACK SURGERY     CHOLECYSTECTOMY     COLONOSCOPY N/A 06/02/2013   Procedure: COLONOSCOPY;  Surgeon: Graylin Shiver, MD;  Location: [facility] END[facility];  Service: Endoscopy;  Laterality: N/A;   HERNIA REPAIR  2000   INCISION AND DRAINAGE ABSCESS Left 07/07/2016   Procedure: DEBRIDMENT LEFT THIGH ABSCESS, EXISION ACUTE SKIN RASH LEFT THIGH(1CM LESION);  Surgeon: Claud Kelp, MD;  Location: [facility];  Service: General;  Laterality: Left;   SEPTOPLASTY  02/2013   spleen anuyism     splenic aneurysm     TRANSMETATARSAL AMPUTATION Bilateral 10/21/2020   Procedure: Left transmetatarsal amputation;  Surgeon: Toni Arthurs, MD;  Location: [facility];  Service: Orthopedics;  Laterality: Bilateral;   TRANSMETATARSAL AMPUTATION Left 03/24/2021   Procedure: TRANSMETATARSAL AMPUTATION;  Surgeon: Toni Arthurs, MD;  Location: [facility] OR;  Service: Orthopedics;  Laterality: Left;   VAGINAL HYSTERECTOMY      There were no vitals filed for this visit.     [facility] PT Assessment - 08/18/21 0001       Assessment   Medical Diagnosis Wound of L foot    Referring Provider (PT) Aleatha Borer             Wound Therapy - 08/18/21 0001  Subjective Patient is a 55 y.o. female who presents to physical therapy with referral for wound of L foot. She has history of foot wounds and toe amputations. She has had these wounds for several months and they hurt to walk. They have not healed but she has kept them clean.    Patient and Family Stated Goals wounds to heal    Date of Onset 05/23/21    Prior Treatments none    Pain Scale Faces    Faces Pain Scale Hurts even more    Pain Location Foot    Pain Orientation Right;Left    Pain Descriptors / Indicators Aching    Evaluation and Treatment Procedures Explained to Patient/Family Yes    Evaluation and Treatment Procedures agreed to    Wound Properties Date First Assessed: 08/18/21 Time First  Assessed: 1000 Location: Foot Location Orientation: Anterior;Left;Lateral Wound Description (Comments): lateral foot wound Present on Admission: Yes   Wound Image Images linked: 1    Dressing Type Gauze (Comment)    Dressing Changed Changed    Dressing Status Old drainage    Dressing Change Frequency PRN    Site / Wound Assessment Granulation tissue;Yellow    % Wound base Red or Granulating 50%    % Wound base Yellow/Fibrinous Exudate 50%    Peri-wound Assessment --   callus   Wound Length (cm) 3 cm    Wound Width (cm) 3.6 cm    Wound Surface Area (cm^2) 10.8 cm^2    Margins Attached edges (approximated)    Drainage Amount Scant    Drainage Description Serous    Treatment Cleansed;Debridement (Selective)    Wound Properties Date First Assessed: 08/18/21 Time First Assessed: 1000 Location: Pretibial Location Orientation: Distal;Left Wound Description (Comments): anterior shin wound Present on Admission: Yes   Wound Image Images linked: 1    Dressing Type Gauze (Comment)    Dressing Changed Changed    Dressing Status Old drainage    Dressing Change Frequency PRN    Site / Wound Assessment Granulation tissue;Yellow    % Wound base Red or Granulating 75%    % Wound base Yellow/Fibrinous Exudate 25%    Wound Length (cm) 0.6 cm    Wound Width (cm) 0.4 cm    Wound Surface Area (cm^2) 0.24 cm^2    Margins Attached edges (approximated)    Drainage Amount Scant    Drainage Description Serous    Treatment Cleansed;Debridement (Selective)    Selective Debridement - Location Lateral foot wounds    Selective Debridement - Tools Used Forceps;Scissors    Selective Debridement - Tissue Removed devitilized tissue/callus    Wound Therapy - Clinical Statement Patient is a 55 y.o. female who presents to physical therapy with referral for wound of L foot. Patient with wounds on RLE also, filled out and sent referral form for all wounds. Patient with several LLE wounds with worst being lateral foot  wound. She also as 1 spot anterior shin and top of foot likely from wrapping too tight. Debrided devitalized tissue and callus LLE wounds with emphasis on lateral foot. Dressed all wounds with xeroform and wrapped with kerlix and #5 netting. Patient will benefit from skilled physical therapy in order to promote wound healing.    Wound Therapy - Functional Problem List walking, standing    Factors Delaying/Impairing Wound Healing Altered sensation;Diabetes Mellitus;Immobility;Multiple medical problems    Hydrotherapy Plan Debridement;Dressing change;Patient/family education;Pulsatile lavage with suction    Wound Therapy - Frequency 2X / week  Wound Plan measure RLE wounds, debride and dressing changes to promote healing    Dressing  vaseline to bilateral feet, xeroform to all wounds, kerlix, #5 netting                   Objective measurements completed on examination: See above findings.             PT Education - 08/18/21 0948     Education Details Patient educated on exam findings, POC, changing dressings if soiled    Person(s) Educated Patient    Methods Explanation    Comprehension Verbalized understanding              PT Short Term Goals - 08/18/21 1042       PT SHORT TERM GOAL #1   Title Patient will demonstrate at least 50% reduction in wound size to reduce risk of infection.    Time 4    Period Weeks    Status New    Target Date 09/15/21               PT Long Term Goals - 08/18/21 1043       PT LONG TERM GOAL #1   Title Patient wound to be healed.    Time 8    Period Weeks    Status New    Target Date 10/13/21      PT LONG TERM GOAL #2   Title Patient will verbalize proper skin care techniques.    Time 8    Period Weeks    Status New    Target Date 10/13/21                  Plan - 08/18/21 1037     Clinical Impression Statement see above    Personal Factors and Comorbidities Comorbidity 3+;Time since onset of  injury/illness/exacerbation    Comorbidities neuropathy, DM, chronic pain, hx weounds, hx toe amputations, osteomyelitis, Wegner's granulomatosis    Examination-Activity Limitations Locomotion Level;Bathing;Transfers;Squat;Stairs;Stand;Hygiene/Grooming    Examination-Participation Restrictions Cleaning;Community Activity;Shop    Stability/Clinical Decision Making Evolving/Moderate complexity    Clinical Decision Making Moderate    Rehab Potential Fair    PT Frequency 2x / week    PT Duration 8 weeks    PT Treatment/Interventions ADLs/Self Care Home Management;Gait training;Stair training;Functional mobility training;Therapeutic activities;Therapeutic exercise;Balance training;DME Instruction;Manual techniques;Manual lymph drainage;Compression bandaging;Taping;Neuromuscular re-education;Patient/family education    PT Next Visit Plan dressing and debride to promote wound healing    Consulted and Agree with Plan of Care Patient             Patient will benefit from skilled therapeutic intervention in order to improve the following deficits and impairments:  Abnormal gait, Difficulty walking, Decreased activity tolerance, Pain, Decreased skin integrity, Decreased mobility  Visit Diagnosis: Difficulty in walking, not elsewhere classified  Open wound of left foot, initial encounter  Wound of right foot    Problem List Patient Active Problem List   Diagnosis Date Noted   Myonecrosis (HCC) 07/01/2021   Tear of gluteus medius tendon 07/01/2021   Left wrist pain 06/01/2021   Medication monitoring encounter 05/10/2021   Fever and chills 04/22/2021   Osteomyelitis (HCC) 03/24/2021   Chronic pain 03/24/2021   COVID-19 virus infection 11/01/2020   Diarrhea 11/01/2020   Dehydration 11/01/2020   Generalized abdominal pain 10/09/2018   Midline low back pain 09/11/2018   Malfunction of intrathecal infusion pump 09/11/2018   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   Steroid-induced  hyperglycemia 05/11/2018  Weakness 05/02/2018   Morbid obesity (HCC) 01/25/2018   Neuropathy 01/25/2018   Presence of intrathecal pump 08/02/2017   Rectocele 03/09/2017   Urinary hesitancy 03/09/2017   Abnormal finding on imaging 12/19/2016   Esophageal dysphagia 12/19/2016   Current chronic use of systemic steroids 10/26/2016   IFG (impaired fasting glucose) 10/26/2016   Arthralgia of multiple joints 07/15/2016   Headache 07/15/2016   Maculopapular rash, generalized 07/15/2016   Peripheral neuropathy 07/04/2016   Rash 07/04/2016   CKD (chronic kidney disease) stage 3, GFR 30-59 ml/min (HCC) 07/04/2016   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   Neuropathic pain of both feet 01/11/2016   OSA (obstructive sleep apnea) 12/29/2015   Chronic insomnia 12/29/2015   Mixed hyperlipidemia 12/29/2015   Obesity, Class II, BMI 35-39.9, with comorbidity 12/29/2015   Polyneuropathy associated with underlying disease (HCC) 12/29/2015   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) 12/29/2015   Class 2 drug-induced obesity with serious comorbidity and body mass index (BMI) of 35.0 to 35.9 in adult 12/29/2015   Other mechanical complication of implanted electronic neurostimulator of spinal cord electrode (lead), initial encounter (HCC) 08/04/2015   Adjustment disorder with depressed mood 03/29/2015   Hyperlipidemia 07/20/2014   Mononeuritis 07/20/2014   Obesity (BMI 30-39.9) 07/20/2014   Essential hypertension 07/20/2014   Neurocardiogenic syncope 07/06/2014   Normocytic anemia 06/08/2014   POTS (postural orthostatic tachycardia syndrome) 06/08/2014   Hypotension 06/03/2014   Anal fissure 09/16/2013   Adrenal insufficiency (HCC) 05/30/2013   Encounter for long-term (current) use of high-risk medication 05/30/2013   Fall 05/30/2013   Hemorrhage of rectum and anus 05/30/2013   Other long term (current) drug therapy 05/30/2013   Syncope and collapse 05/30/2013    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/30/2013   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/2013   Congenital syphilitic osteochondritis    Gastroesophageal reflux disease without esophagitis 03/07/2012   Other diseases of trachea and bronchus 08/30/2011   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

## 2021-08-23 ENCOUNTER — Ambulatory Visit (HOSPITAL_COMMUNITY): Payer: BC Managed Care – PPO | Attending: Nurse Practitioner | Admitting: Physical Therapy

## 2021-08-23 ENCOUNTER — Encounter (HOSPITAL_COMMUNITY): Payer: Self-pay

## 2021-08-23 ENCOUNTER — Telehealth (HOSPITAL_COMMUNITY): Payer: Self-pay | Admitting: Physical Therapy

## 2021-08-23 DIAGNOSIS — S91302A Unspecified open wound, left foot, initial encounter: Secondary | ICD-10-CM | POA: Insufficient documentation

## 2021-08-23 DIAGNOSIS — S91301A Unspecified open wound, right foot, initial encounter: Secondary | ICD-10-CM | POA: Insufficient documentation

## 2021-08-23 DIAGNOSIS — R262 Difficulty in walking, not elsewhere classified: Secondary | ICD-10-CM | POA: Insufficient documentation

## 2021-08-23 NOTE — Telephone Encounter (Signed)
Called pt re first no show.  Pt states that she tried to cancel the appointment on the phone tree.  Therapist reminded pt that her next appointment is on Nov. 3rd at 2:45.  Virgina Organ, PT CLT [phone]

## 2021-08-25 ENCOUNTER — Other Ambulatory Visit: Payer: Self-pay

## 2021-08-25 ENCOUNTER — Encounter (HOSPITAL_COMMUNITY): Payer: Self-pay | Admitting: Physical Therapy

## 2021-08-25 ENCOUNTER — Ambulatory Visit (HOSPITAL_COMMUNITY): Payer: BC Managed Care – PPO | Admitting: Physical Therapy

## 2021-08-25 DIAGNOSIS — R262 Difficulty in walking, not elsewhere classified: Secondary | ICD-10-CM | POA: Diagnosis present

## 2021-08-25 DIAGNOSIS — S91302A Unspecified open wound, left foot, initial encounter: Secondary | ICD-10-CM

## 2021-08-25 DIAGNOSIS — S91301A Unspecified open wound, right foot, initial encounter: Secondary | ICD-10-CM

## 2021-08-25 NOTE — Therapy (Signed)
[place], [place], [address] Phone: [phone]   Fax:  [phone]  Wound Care Therapy  Patient Details  Name: [name] MRN: [id number] Date of Birth: Dec 13, 1965 Referring Provider (PT): Fonnie Jarvis   Encounter Date: 08/25/2021   PT End of Session - 08/25/21 1444     Visit Number 2    Number of Visits 16    Date for PT Re-Evaluation 10/13/21    Authorization Type BCBS (no auth, No VL)    Progress Note Due on Visit 10    PT Start Time 1445    PT Stop Time 1525    PT Time Calculation (min) 40 min    Activity Tolerance Patient tolerated treatment well    Behavior During Therapy [facility] for tasks assessed/performed             Past Medical History:  Diagnosis Date   Chronic cough    Chronic pain    Diabetes mellitus without complication (HCC)    Dyspnea    GERD (gastroesophageal reflux disease)    Hypokalemia    Hypothyroidism    Left wrist pain 06/01/2021   Myonecrosis ([place]) 07/01/2021   Neurocardiogenic syncope    OSA (obstructive sleep apnea)    does not use CPAP   Osteomyelitis ([place])    bilateral feet   Peripheral neuropathy    Presence of intrathecal pump    recieves Prialt/bupivicaine   Tear of gluteus medius tendon 07/01/2021   Wegener's granulomatosis 2009   Wegner's disease (congenital syphilitic osteochondritis)     Past Surgical History:  Procedure Laterality Date   AMPUTATION Right 10/21/2020   Procedure: Right hallux amputation;  Surgeon: Wylene Simmer, MD;  Location: [place];  Service: Orthopedics;  Laterality: Right;   AMPUTATION TOE Bilateral 08/12/2020   Procedure: Right 3rd toe amputation; left hallux and 3rd toe amputation;  Surgeon: Wylene Simmer, MD;  Location: [place];  Service: Orthopedics;  Laterality: Bilateral;  67min   AMPUTATION TOE Right 03/24/2021   Procedure: AMPUTATION TOE;  Surgeon: Wylene Simmer, MD;  Location: [place];  Service:  Orthopedics;  Laterality: Right;   APPENDECTOMY     BACK SURGERY     CHOLECYSTECTOMY     COLONOSCOPY N/A 06/02/2013   Procedure: COLONOSCOPY;  Surgeon: Wonda Horner, MD;  Location: [facility];  Service: Endoscopy;  Laterality: N/A;   HERNIA REPAIR  2000   INCISION AND DRAINAGE ABSCESS Left 07/07/2016   Procedure: DEBRIDMENT LEFT THIGH ABSCESS, EXISION ACUTE SKIN RASH LEFT THIGH(1CM LESION);  Surgeon: Fanny Skates, MD;  Location: [facility];  Service: General;  Laterality: Left;   SEPTOPLASTY  02/2013   spleen anuyism     splenic aneurysm     TRANSMETATARSAL AMPUTATION Bilateral 10/21/2020   Procedure: Left transmetatarsal amputation;  Surgeon: Wylene Simmer, MD;  Location: [place];  Service: Orthopedics;  Laterality: Bilateral;   TRANSMETATARSAL AMPUTATION Left 03/24/2021   Procedure: TRANSMETATARSAL AMPUTATION;  Surgeon: Wylene Simmer, MD;  Location: [place];  Service: Orthopedics;  Laterality: Left;   VAGINAL HYSTERECTOMY      There were no vitals filed for this visit.               Wound Therapy - 08/25/21 0001     Subjective She has been changing dressings every 3 days    Patient and Family Stated Goals wounds to heal    Date of Onset 05/23/21    Prior  Treatments none    Faces Pain Scale Hurts whole lot    Pain Location Foot    Pain Orientation Right;Left    Pain Descriptors / Indicators Aching    Evaluation and Treatment Procedures Explained to Patient/Family Yes    Evaluation and Treatment Procedures agreed to    Wound Properties Date First Assessed: 08/25/21 Time First Assessed: 1500 Location: Foot Location Orientation: Anterior;Right Wound Description (Comments): Lateral and plantar wound Present on Admission: No   Wound Image Images linked: 1    Dressing Type Gauze (Comment)    Dressing Changed Changed    Dressing Status Old drainage    Dressing Change Frequency PRN    Site / Wound Assessment Granulation tissue    % Wound base Red or Granulating  100%    Drainage Amount Minimal    Drainage Description Serosanguineous    Treatment Cleansed;Debridement (Selective)    Wound Properties Date First Assessed: 08/25/21 Time First Assessed: 1500 Location: Ankle Location Orientation: Anterior;Right Wound Description (Comments): R dorsal foot Present on Admission: Yes   Wound Image Images linked: 1    Dressing Type Gauze (Comment)    Dressing Changed Changed    Dressing Status Old drainage    Dressing Change Frequency PRN    Site / Wound Assessment Granulation tissue;Yellow    % Wound base Red or Granulating 25%    % Wound base Yellow/Fibrinous Exudate 75%    Margins Unattached edges (unapproximated)    Drainage Amount Minimal    Drainage Description Serosanguineous    Treatment Cleansed;Debridement (Selective)    Wound Properties Date First Assessed: 08/18/21 Time First Assessed: 1000 Location: Foot Location Orientation: Anterior;Left;Lateral Wound Description (Comments): lateral foot wound Present on Admission: Yes   Wound Image Images linked: 1    Dressing Type Gauze (Comment)    Dressing Changed Changed    Dressing Status Old drainage    Dressing Change Frequency PRN    Site / Wound Assessment Granulation tissue    % Wound base Red or Granulating 100%    Margins Epibole (rolled edges)    Drainage Amount Scant    Drainage Description Serous    Treatment Cleansed;Debridement (Selective)    Wound Properties Date First Assessed: 08/18/21 Time First Assessed: 1000 Location: Pretibial Location Orientation: Distal;Left Wound Description (Comments): anterior shin wound Present on Admission: Yes   Wound Image Images linked: 1    Dressing Type Gauze (Comment)    Dressing Changed Changed    Dressing Change Frequency PRN    Site / Wound Assessment Granulation tissue    % Wound base Red or Granulating 75%    % Wound base Yellow/Fibrinous Exudate 25%    Margins Unattached edges (unapproximated)    Drainage Amount Minimal    Drainage  Description Serosanguineous    Treatment Cleansed;Debridement (Selective)    Selective Debridement - Location wound beds and periwound    Selective Debridement - Tools Used Forceps;Scissors    Selective Debridement - Tissue Removed devitilized tissue/callus    Wound Therapy - Clinical Statement Patient L foot wound appears to be healing well. Patient with new wound R Lateral/plantar foot which was mostly granulation tissue and previous dorsal wound which is mostly slough. Got new order for RLE. Cleansed and debrided all wounds and dressed with xeroform, kerlix and netting. Gave patient xeroform for if she has to change dressings this weekend to redress wounds.    Wound Therapy - Functional Problem List walking, standing    Factors Delaying/Impairing Wound Healing Altered  sensation;Diabetes Mellitus;Immobility;Multiple medical problems    Hydrotherapy Plan Debridement;Dressing change;Patient/family education;Pulsatile lavage with suction    Wound Therapy - Frequency 2X / week    Wound Plan measure RLE wounds, debride and dressing changes to promote healing; f/u regarding how to get wound care supplies for home use    Dressing  vaseline to bilateral feet, xeroform to all wounds, kerlix, #5 netting                       PT Short Term Goals - 08/18/21 1042       PT SHORT TERM GOAL #1   Title Patient will demonstrate at least 50% reduction in wound size to reduce risk of infection.    Time 4    Period Weeks    Status New    Target Date 09/15/21               PT Long Term Goals - 08/18/21 1043       PT LONG TERM GOAL #1   Title Patient wound to be healed.    Time 8    Period Weeks    Status New    Target Date 10/13/21      PT LONG TERM GOAL #2   Title Patient will verbalize proper skin care techniques.    Time 8    Period Weeks    Status New    Target Date 10/13/21                   Plan - 08/25/21 1445     Clinical Impression Statement see above     Personal Factors and Comorbidities Comorbidity 3+;Time since onset of injury/illness/exacerbation    Comorbidities neuropathy, DM, chronic pain, hx weounds, hx toe amputations, osteomyelitis, Wegner's granulomatosis    Examination-Activity Limitations Locomotion Level;Bathing;Transfers;Squat;Stairs;Stand;Hygiene/Grooming    Examination-Participation Restrictions Cleaning;Community Activity;Shop    Stability/Clinical Decision Making Evolving/Moderate complexity    Rehab Potential Fair    PT Frequency 2x / week    PT Duration 8 weeks    PT Treatment/Interventions ADLs/Self Care Home Management;Gait training;Stair training;Functional mobility training;Therapeutic activities;Therapeutic exercise;Balance training;DME Instruction;Manual techniques;Manual lymph drainage;Compression bandaging;Taping;Neuromuscular re-education;Patient/family education    PT Next Visit Plan dressing and debride to promote wound healing    Consulted and Agree with Plan of Care Patient             Patient will benefit from skilled therapeutic intervention in order to improve the following deficits and impairments:  Abnormal gait, Difficulty walking, Decreased activity tolerance, Pain, Decreased skin integrity, Decreased mobility  Visit Diagnosis: Difficulty in walking, not elsewhere classified  Open wound of left foot, initial encounter  Wound of right foot     Problem List Patient Active Problem List   Diagnosis Date Noted   Myonecrosis (HCC) 07/01/2021   Tear of gluteus medius tendon 07/01/2021   Left wrist pain 06/01/2021   Medication monitoring encounter 05/10/2021   Fever and chills 04/22/2021   Osteomyelitis (HCC) 03/24/2021   Chronic pain 03/24/2021   COVID-19 virus infection 11/01/2020   Diarrhea 11/01/2020   Dehydration 11/01/2020   Generalized abdominal pain 10/09/2018   Midline low back pain 09/11/2018   Malfunction of intrathecal infusion pump 09/11/2018   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   Steroid-induced hyperglycemia 05/11/2018   Weakness 05/02/2018   Morbid obesity (HCC) 01/25/2018   Neuropathy 01/25/2018   Presence of intrathecal pump 08/02/2017   Rectocele 03/09/2017   Urinary hesitancy 03/09/2017   Abnormal finding  on imaging 12/19/2016   Esophageal dysphagia 12/19/2016   Current chronic use of systemic steroids 10/26/2016   IFG (impaired fasting glucose) 10/26/2016   Arthralgia of multiple joints 07/15/2016   Headache 07/15/2016   Maculopapular rash, generalized 07/15/2016   Peripheral neuropathy 07/04/2016   Rash 07/04/2016   CKD (chronic kidney disease) stage 3, GFR 30-59 ml/min (HCC) 07/04/2016   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   Neuropathic pain of both feet 01/11/2016   OSA (obstructive sleep apnea) 12/29/2015   Chronic insomnia 12/29/2015   Mixed hyperlipidemia 12/29/2015   Obesity, Class II, BMI 35-39.9, with comorbidity 12/29/2015   Polyneuropathy associated with underlying disease ([place]) 12/29/2015   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 ([place]) 12/29/2015   Class 2 drug-induced obesity with serious comorbidity and body mass index (BMI) of 35.0 to 35.9 in adult 12/29/2015   Other mechanical complication of implanted electronic neurostimulator of spinal cord electrode (lead), initial encounter ([place]) 08/04/2015   Adjustment disorder with depressed mood 03/29/2015   Hyperlipidemia 07/20/2014   Mononeuritis 07/20/2014   Obesity (BMI 30-39.9) 07/20/2014   Essential hypertension 07/20/2014   Neurocardiogenic syncope 07/06/2014   Normocytic anemia 06/08/2014   POTS (postural orthostatic tachycardia syndrome) 06/08/2014   Hypotension 06/03/2014   Anal fissure 09/16/2013   Adrenal insufficiency ([place]) 05/30/2013   Encounter for long-term (current) use of high-risk medication 05/30/2013   Fall 05/30/2013   Hemorrhage of rectum and anus 05/30/2013   Other long term (current) drug therapy 05/30/2013    Syncope and collapse 05/30/2013   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[place]) 05/30/2013   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[place]) 05/30/2013   Congenital syphilitic osteochondritis    Gastroesophageal reflux disease without esophagitis 03/07/2012   Other diseases of trachea and bronchus 08/30/2011   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

## 2021-08-30 ENCOUNTER — Ambulatory Visit (HOSPITAL_COMMUNITY): Payer: BC Managed Care – PPO

## 2021-08-30 ENCOUNTER — Encounter (HOSPITAL_COMMUNITY): Payer: Self-pay

## 2021-08-30 ENCOUNTER — Other Ambulatory Visit: Payer: Self-pay

## 2021-08-30 DIAGNOSIS — R262 Difficulty in walking, not elsewhere classified: Secondary | ICD-10-CM | POA: Diagnosis not present

## 2021-08-30 DIAGNOSIS — S91302A Unspecified open wound, left foot, initial encounter: Secondary | ICD-10-CM

## 2021-08-30 DIAGNOSIS — S91301A Unspecified open wound, right foot, initial encounter: Secondary | ICD-10-CM

## 2021-08-30 NOTE — Therapy (Signed)
[facility] [address] [place], [place], [address] Phone: [phone]   Fax:  [phone]  Wound Care Therapy  Patient Details  Name: [name] MRN: [id number] Date of Birth: July 01, 1966 Referring Provider (PT): Aleatha Borer   Encounter Date: 08/30/2021    Past Medical History:  Diagnosis Date   Chronic cough    Chronic pain    Diabetes mellitus without complication (HCC)    Dyspnea    GERD (gastroesophageal reflux disease)    Hypokalemia    Hypothyroidism    Left wrist pain 06/01/2021   Myonecrosis (HCC) 07/01/2021   Neurocardiogenic syncope    OSA (obstructive sleep apnea)    does not use CPAP   Osteomyelitis (HCC)    bilateral feet   Peripheral neuropathy    Presence of intrathecal pump    recieves Prialt/bupivicaine   Tear of gluteus medius tendon 07/01/2021   Wegener's granulomatosis 2009   Wegner's disease (congenital syphilitic osteochondritis)     Past Surgical History:  Procedure Laterality Date   AMPUTATION Right 10/21/2020   Procedure: Right hallux amputation;  Surgeon: Toni Arthurs, MD;  Location: [facility];  Service: Orthopedics;  Laterality: Right;   AMPUTATION TOE Bilateral 08/12/2020   Procedure: Right 3rd toe amputation; left hallux and 3rd toe amputation;  Surgeon: Toni Arthurs, MD;  Location: [facility];  Service: Orthopedics;  Laterality: Bilateral;    AMPUTATION TOE Right 03/24/2021   Procedure: AMPUTATION TOE;  Surgeon: Toni Arthurs, MD;  Location: [facility];  Service: Orthopedics;  Laterality: Right;   APPENDECTOMY     BACK SURGERY     CHOLECYSTECTOMY     COLONOSCOPY N/A 06/02/2013   Procedure: COLONOSCOPY;  Surgeon: Graylin Shiver, MD;  Location: [facility];  Service: Endoscopy;  Laterality: N/A;   HERNIA REPAIR  2000   INCISION AND DRAINAGE ABSCESS Left 07/07/2016   Procedure: DEBRIDMENT LEFT THIGH ABSCESS, EXISION ACUTE SKIN RASH LEFT THIGH(1CM LESION);  Surgeon:  Claud Kelp, MD;  [facility];  Service: General;  Laterality: Left;   SEPTOPLASTY  02/2013   spleen anuyism     splenic aneurysm     TRANSMETATARSAL AMPUTATION Bilateral 10/21/2020   Procedure: Left transmetatarsal amputation;  Surgeon: Toni Arthurs, MD;  Location: [facility];  Service: Orthopedics;  Laterality: Bilateral;   TRANSMETATARSAL AMPUTATION Left 03/24/2021   Procedure: TRANSMETATARSAL AMPUTATION;  Surgeon: Toni Arthurs, MD;  Location: [facility];  Service: Orthopedics;  Laterality: Left;   VAGINAL HYSTERECTOMY      There were no vitals filed for this visit.    Subjective Assessment - 08/30/21 1151     Subjective Pt stated she has constant pain BLE, burning pain scale 6/10.    Currently in Pain? Yes    Pain Location Foot    Pain Orientation Right;Left    Pain Descriptors / Indicators Burning;Tingling    Pain Frequency Constant                       Wound Therapy - 08/30/21 1151     Subjective Pt stated she has constant pain BLE, burning pain scale 6/10.    Patient and Family Stated Goals wounds to heal    Date of Onset 05/23/21    Prior Treatments none    Pain Scale 0-10    Pain Score 6     Evaluation and Treatment Procedures Explained to Patient/Family Yes    Evaluation and Treatment Procedures  agreed to    Wound Properties Date First Assessed: 08/25/21 Time First Assessed: 1500 Location: Foot Location Orientation: Anterior;Right Wound Description (Comments): Lateral and plantar wound Present on Admission: No   Wound Image Images linked: 1    Dressing Type Impregnated gauze (bismuth);Gauze (Comment)   xeroform, kerlix, coban   Dressing Changed Changed    Dressing Status Old drainage    Dressing Change Frequency PRN    Site / Wound Assessment Granulation tissue    % Wound base Red or Granulating 100%    Wound Length (cm) 0.3 cm   distal not lateral   Wound Width (cm) 0.3 cm   distal not lateral wound   Wound Surface Area (cm^2) 0.09  cm^2    Drainage Amount Minimal    Drainage Description Serosanguineous    Treatment Cleansed    Wound Properties Date First Assessed: 08/25/21 Time First Assessed: 1500 Location: Ankle Location Orientation: Anterior;Right Wound Description (Comments): R dorsal foot Present on Admission: Yes   Wound Image Images linked: 1    Dressing Type Impregnated gauze (bismuth);Gauze (Comment)   xeroform, kerlix, coban   Dressing Changed Changed    Dressing Status Old drainage    Dressing Change Frequency PRN    Site / Wound Assessment Granulation tissue    % Wound base Red or Granulating 30%    % Wound base Yellow/Fibrinous Exudate 70%    Wound Length (cm) 1.1 cm    Wound Width (cm) 1 cm    Wound Surface Area (cm^2) 1.1 cm^2    Margins Unattached edges (unapproximated)    Drainage Amount Minimal    Drainage Description Serosanguineous    Treatment Cleansed;Debridement (Selective)    Wound Properties Date First Assessed: 08/18/21 Time First Assessed: 1000 Location: Foot Location Orientation: Anterior;Left;Lateral Wound Description (Comments): lateral foot wound Present on Admission: Yes   Wound Image Images linked: 1    Dressing Type Impregnated gauze (bismuth);Gauze (Comment)   xeroform, kerlix, coban   Dressing Changed Changed    Dressing Status Old drainage    Dressing Change Frequency PRN    Site / Wound Assessment Granulation tissue    % Wound base Red or Granulating 100%    Peri-wound Assessment --   callous   Margins Epibole (rolled edges)    Drainage Amount Scant    Drainage Description Serous    Treatment Cleansed;Debridement (Selective)    Wound Properties Date First Assessed: 08/18/21 Time First Assessed: 1000 Location: Pretibial Location Orientation: Distal;Left Wound Description (Comments): anterior shin wound Present on Admission: Yes   Wound Image Images linked: 1    Dressing Type Impregnated gauze (bismuth);Gauze (Comment)   xeroform, kerlix, coban   Dressing Changed Changed     Dressing Change Frequency PRN    Site / Wound Assessment Granulation tissue    % Wound base Red or Granulating 75%    % Wound base Yellow/Fibrinous Exudate 25%    Wound Length (cm) 1 cm    Wound Width (cm) 0.7 cm    Wound Surface Area (cm^2) 0.7 cm^2    Margins Unattached edges (unapproximated)    Drainage Amount Scant    Drainage Description Serous    Treatment Cleansed;Debridement (Selective)    Selective Debridement - Location wound beds and periwound    Selective Debridement - Tools Used Forceps;Scissors    Selective Debridement - Tissue Removed devitilized tissue/callus    Wound Therapy - Clinical Statement Pt arrived with dressing on Bil feet, upper wound on Lt LE exposed.  Pt educated on importance of keeping wounds covered to reduce risk of bacteria getting into wound and needs to keep wounds moist with verbalized understanding.  Selective debridement complete to address slough in wound bed and dry skin perimeter to promote healing.  Improved granulation tissues noted Lt foot following debridement.  Continues wiht xeroform, kerlix and coban, encouraged pt to continue to cover upper leg if does change dressings at home.    Wound Therapy - Functional Problem List walking, standing    Factors Delaying/Impairing Wound Healing Altered sensation;Diabetes Mellitus;Immobility;Multiple medical problems    Hydrotherapy Plan Debridement;Dressing change;Patient/family education;Pulsatile lavage with suction    Wound Therapy - Frequency 2X / week    Wound Plan measure RLE wounds, debride and dressing changes to promote healing; f/u regarding how to get wound care supplies for home use    Dressing  vaseline to bilateral feet, xeroform to all wounds, kerlix, coban, #5 netting                       PT Short Term Goals - 08/18/21 1042       PT SHORT TERM GOAL #1   Title Patient will demonstrate at least 50% reduction in wound size to reduce risk of infection.    Time 4    Period  Weeks    Status New    Target Date 09/15/21               PT Long Term Goals - 08/18/21 1043       PT LONG TERM GOAL #1   Title Patient wound to be healed.    Time 8    Period Weeks    Status New    Target Date 10/13/21      PT LONG TERM GOAL #2   Title Patient will verbalize proper skin care techniques.    Time 8    Period Weeks    Status New    Target Date 10/13/21                    Patient will benefit from skilled therapeutic intervention in order to improve the following deficits and impairments:     Visit Diagnosis: Difficulty in walking, not elsewhere classified  Wound of right foot  Open wound of left foot, initial encounter     Problem List Patient Active Problem List   Diagnosis Date Noted   Myonecrosis ([place]) 07/01/2021   Tear of gluteus medius tendon 07/01/2021   Left wrist pain 06/01/2021   Medication monitoring encounter 05/10/2021   Fever and chills 04/22/2021   Osteomyelitis ([place]) 03/24/2021   Chronic pain 03/24/2021   COVID-19 virus infection 11/01/2020   Diarrhea 11/01/2020   Dehydration 11/01/2020   Generalized abdominal pain 10/09/2018   Midline low back pain 09/11/2018   Malfunction of intrathecal infusion pump 09/11/2018   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   Steroid-induced hyperglycemia 05/11/2018   Weakness 05/02/2018   Morbid obesity ([place]) 01/25/2018   Neuropathy 01/25/2018   Presence of intrathecal pump 08/02/2017   Rectocele 03/09/2017   Urinary hesitancy 03/09/2017   Abnormal finding on imaging 12/19/2016   Esophageal dysphagia 12/19/2016   Current chronic use of systemic steroids 10/26/2016   IFG (impaired fasting glucose) 10/26/2016   Arthralgia of multiple joints 07/15/2016   Headache 07/15/2016   Maculopapular rash, generalized 07/15/2016   Peripheral neuropathy 07/04/2016   Rash 07/04/2016   CKD (chronic kidney disease) stage 3, GFR 30-59 ml/min (HCC) 07/04/2016  Chemotherapy-induced  peripheral neuropathy (HCC) 01/16/2016   Neuropathic pain of both feet 01/11/2016   OSA (obstructive sleep apnea) 12/29/2015   Chronic insomnia 12/29/2015   Mixed hyperlipidemia 12/29/2015   Obesity, Class II, BMI 35-39.9, with comorbidity 12/29/2015   Polyneuropathy associated with underlying disease ([place]) 12/29/2015   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 ([place]) 12/29/2015   Class 2 drug-induced obesity with serious comorbidity and body mass index (BMI) of 35.0 to 35.9 in adult 12/29/2015   Other mechanical complication of implanted electronic neurostimulator of spinal cord electrode (lead), initial encounter ([place]) 08/04/2015   Adjustment disorder with depressed mood 03/29/2015   Hyperlipidemia 07/20/2014   Mononeuritis 07/20/2014   Obesity (BMI 30-39.9) 07/20/2014   Essential hypertension 07/20/2014   Neurocardiogenic syncope 07/06/2014   Normocytic anemia 06/08/2014   POTS (postural orthostatic tachycardia syndrome) 06/08/2014   Hypotension 06/03/2014   Anal fissure 09/16/2013   Adrenal insufficiency ([place]) 05/30/2013   Encounter for long-term (current) use of high-risk medication 05/30/2013   Fall 05/30/2013   Hemorrhage of rectum and anus 05/30/2013   Other long term (current) drug therapy 05/30/2013   Syncope and collapse 05/30/2013   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[place]) 05/30/2013   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[place]) 05/30/2013   Congenital syphilitic osteochondritis    Gastroesophageal reflux disease without esophagitis 03/07/2012   Other diseases of trachea and bronchus 08/30/2011   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

## 2021-09-01 ENCOUNTER — Other Ambulatory Visit: Payer: Self-pay

## 2021-09-01 ENCOUNTER — Ambulatory Visit (HOSPITAL_COMMUNITY): Payer: BC Managed Care – PPO | Admitting: Physical Therapy

## 2021-09-01 DIAGNOSIS — R262 Difficulty in walking, not elsewhere classified: Secondary | ICD-10-CM | POA: Diagnosis not present

## 2021-09-01 DIAGNOSIS — S91302A Unspecified open wound, left foot, initial encounter: Secondary | ICD-10-CM

## 2021-09-01 DIAGNOSIS — S91301A Unspecified open wound, right foot, initial encounter: Secondary | ICD-10-CM

## 2021-09-01 LAB — FLOW CYTOMETRY

## 2021-09-01 NOTE — Therapy (Signed)
[place], [place], [address] Phone: [phone]   Fax:  [phone]  Wound Care Therapy  Patient Details  Name: [name] MRN: [id number] Date of Birth: 08-05-1966 Referring Provider (PT): Fonnie Jarvis   Encounter Date: 09/01/2021   PT End of Session - 09/01/21 1435     Visit Number 3    Number of Visits 16    Date for PT Re-Evaluation 10/13/21    Authorization Type BCBS (no auth, No VL)    Progress Note Due on Visit 10    PT Start Time 1321    PT Stop Time 1405    PT Time Calculation (min) 44 min    Activity Tolerance Patient tolerated treatment well    Behavior During Therapy [facility] for tasks assessed/performed             Past Medical History:  Diagnosis Date   Chronic cough    Chronic pain    Diabetes mellitus without complication (HCC)    Dyspnea    GERD (gastroesophageal reflux disease)    Hypokalemia    Hypothyroidism    Left wrist pain 06/01/2021   Myonecrosis ([place]) 07/01/2021   Neurocardiogenic syncope    OSA (obstructive sleep apnea)    does not use CPAP   Osteomyelitis ([place])    bilateral feet   Peripheral neuropathy    Presence of intrathecal pump    recieves Prialt/bupivicaine   Tear of gluteus medius tendon 07/01/2021   Wegener's granulomatosis 2009   Wegner's disease (congenital syphilitic osteochondritis)     Past Surgical History:  Procedure Laterality Date   AMPUTATION Right 10/21/2020   Procedure: Right hallux amputation;  Surgeon: Wylene Simmer, MD;  Location: [place];  Service: Orthopedics;  Laterality: Right;   AMPUTATION TOE Bilateral 08/12/2020   Procedure: Right 3rd toe amputation; left hallux and 3rd toe amputation;  Surgeon: Wylene Simmer, MD;  Location: [place];  Service: Orthopedics;  Laterality: Bilateral;  7min   AMPUTATION TOE Right 03/24/2021   Procedure: AMPUTATION TOE;  Surgeon: Wylene Simmer, MD;  Location: [place];   Service: Orthopedics;  Laterality: Right;   APPENDECTOMY     BACK SURGERY     CHOLECYSTECTOMY     COLONOSCOPY N/A 06/02/2013   Procedure: COLONOSCOPY;  Surgeon: Wonda Horner, MD;  Location: [facility];  Service: Endoscopy;  Laterality: N/A;   HERNIA REPAIR  2000   INCISION AND DRAINAGE ABSCESS Left 07/07/2016   Procedure: DEBRIDMENT LEFT THIGH ABSCESS, EXISION ACUTE SKIN RASH LEFT THIGH(1CM LESION);  Surgeon: Fanny Skates, MD;  Location: [facility];  Service: General;  Laterality: Left;   SEPTOPLASTY  02/2013   spleen anuyism     splenic aneurysm     TRANSMETATARSAL AMPUTATION Bilateral 10/21/2020   Procedure: Left transmetatarsal amputation;  Surgeon: Wylene Simmer, MD;  Location: [place];  Service: Orthopedics;  Laterality: Bilateral;   TRANSMETATARSAL AMPUTATION Left 03/24/2021   Procedure: TRANSMETATARSAL AMPUTATION;  Surgeon: Wylene Simmer, MD;  Location: [place];  Service: Orthopedics;  Laterality: Left;   VAGINAL HYSTERECTOMY      There were no vitals filed for this visit.               Wound Therapy - 09/01/21 1427     Subjective pt states she is doing better and not having any pain currently.  Dressings intact.    Patient and Family Stated Goals wounds to heal    Date  of Onset 05/23/21    Prior Treatments none    Pain Scale 0-10    Pain Score 0-No pain    Evaluation and Treatment Procedures Explained to Patient/Family Yes    Evaluation and Treatment Procedures agreed to    Wound Properties Date First Assessed: 08/25/21 Time First Assessed: 1500 Location: Foot Location Orientation: Anterior;Right Wound Description (Comments): Lateral and plantar wound Present on Admission: No   Dressing Type Impregnated gauze (bismuth)    Dressing Changed Changed    Dressing Status Intact    Dressing Change Frequency PRN    Site / Wound Assessment Granulation tissue    % Wound base Red or Granulating 100%    Drainage Amount Scant    Drainage Description  Serosanguineous    Treatment Cleansed;Debridement (Selective)    Wound Properties Date First Assessed: 08/25/21 Time First Assessed: 1500 Location: Ankle Location Orientation: Anterior;Right Wound Description (Comments): R dorsal foot Present on Admission: Yes   Dressing Type Impregnated gauze (bismuth)    Dressing Changed Changed    Dressing Status Old drainage    Dressing Change Frequency PRN    Site / Wound Assessment Granulation tissue    % Wound base Red or Granulating 40%    % Wound base Yellow/Fibrinous Exudate 60%    Margins Attached edges (approximated)    Drainage Amount Scant    Drainage Description Serosanguineous    Treatment Cleansed;Debridement (Selective)    Wound Properties Date First Assessed: 08/18/21 Time First Assessed: 1000 Location: Foot Location Orientation: Anterior;Left;Lateral Wound Description (Comments): lateral foot wound Present on Admission: Yes   Dressing Type Impregnated gauze (bismuth)    Dressing Changed Changed    Dressing Status Old drainage    Dressing Change Frequency PRN    Site / Wound Assessment Granulation tissue    % Wound base Red or Granulating 100%    Peri-wound Assessment Black    Margins Epibole (rolled edges)    Drainage Amount Scant    Treatment Cleansed;Debridement (Selective)    Wound Properties Date First Assessed: 08/18/21 Time First Assessed: 1000 Location: Pretibial Location Orientation: Distal;Left Wound Description (Comments): anterior shin wound Present on Admission: Yes   Dressing Type Impregnated gauze (bismuth)    Dressing Changed Changed    Dressing Status Clean;Intact;Dry    Site / Wound Assessment Granulation tissue    % Wound base Red or Granulating 85%    % Wound base Yellow/Fibrinous Exudate 15%    Margins Unattached edges (unapproximated)    Drainage Amount Scant    Drainage Description Serous    Treatment Cleansed;Debridement (Selective)    Selective Debridement - Location wound beds and periwound     Selective Debridement - Tools Used Forceps;Scissors    Selective Debridement - Tissue Removed devitilized tissue/callus    Wound Therapy - Clinical Statement dressings intactk, however reports she takes them off to bathe and spouse states she sometimes "picks:" at her wounds.  Educated on bacteria under nails/on skin and encouraged not to do this.  Educated on cast protectors to place over LE's when bathing and not to change bandages as wounds heal better when not disturbed.  Pt/spoouse verbalized understanding.  Overall improving with increased approximation and granulation.  Moisturized LE's well following cleansing and continued dressing with use of xeroform.    Wound Therapy - Functional Problem List walking, standing    Factors Delaying/Impairing Wound Healing Altered sensation;Diabetes Mellitus;Immobility;Multiple medical problems    Hydrotherapy Plan Debridement;Dressing change;Patient/family education;Pulsatile lavage with suction    Wound Therapy -  Frequency 2X / week    Wound Plan measure RLE wounds weekly, debride and dressing changes to promote healing; f/u regarding how to get wound care supplies for home use    Dressing  vaseline to bilateral feet, xeroform to all wounds, kerlix, coban, #5 netting                       PT Short Term Goals - 08/18/21 1042       PT SHORT TERM GOAL #1   Title Patient will demonstrate at least 50% reduction in wound size to reduce risk of infection.    Time 4    Period Weeks    Status New    Target Date 09/15/21               PT Long Term Goals - 08/18/21 1043       PT LONG TERM GOAL #1   Title Patient wound to be healed.    Time 8    Period Weeks    Status New    Target Date 10/13/21      PT LONG TERM GOAL #2   Title Patient will verbalize proper skin care techniques.    Time 8    Period Weeks    Status New    Target Date 10/13/21                    Patient will benefit from skilled therapeutic  intervention in order to improve the following deficits and impairments:     Visit Diagnosis: Difficulty in walking, not elsewhere classified  Wound of right foot  Open wound of left foot, initial encounter     Problem List Patient Active Problem List   Diagnosis Date Noted   Myonecrosis ([place]) 07/01/2021   Tear of gluteus medius tendon 07/01/2021   Left wrist pain 06/01/2021   Medication monitoring encounter 05/10/2021   Fever and chills 04/22/2021   Osteomyelitis ([place]) 03/24/2021   Chronic pain 03/24/2021   COVID-19 virus infection 11/01/2020   Diarrhea 11/01/2020   Dehydration 11/01/2020   Generalized abdominal pain 10/09/2018   Midline low back pain 09/11/2018   Malfunction of intrathecal infusion pump 09/11/2018   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   Steroid-induced hyperglycemia 05/11/2018   Weakness 05/02/2018   Morbid obesity ([place]) 01/25/2018   Neuropathy 01/25/2018   Presence of intrathecal pump 08/02/2017   Rectocele 03/09/2017   Urinary hesitancy 03/09/2017   Abnormal finding on imaging 12/19/2016   Esophageal dysphagia 12/19/2016   Current chronic use of systemic steroids 10/26/2016   IFG (impaired fasting glucose) 10/26/2016   Arthralgia of multiple joints 07/15/2016   Headache 07/15/2016   Maculopapular rash, generalized 07/15/2016   Peripheral neuropathy 07/04/2016   Rash 07/04/2016   CKD (chronic kidney disease) stage 3, GFR 30-59 ml/min (HCC) 07/04/2016   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   Neuropathic pain of both feet 01/11/2016   OSA (obstructive sleep apnea) 12/29/2015   Chronic insomnia 12/29/2015   Mixed hyperlipidemia 12/29/2015   Obesity, Class II, BMI 35-39.9, with comorbidity 12/29/2015   Polyneuropathy associated with underlying disease ([place]) 12/29/2015   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 ([place]) 12/29/2015   Class 2 drug-induced obesity with serious comorbidity and body mass index (BMI) of  35.0 to 35.9 in adult 12/29/2015   Other mechanical complication of implanted electronic neurostimulator of spinal cord electrode (lead), initial encounter ([place]) 08/04/2015   Adjustment disorder with depressed mood 03/29/2015   Hyperlipidemia 07/20/2014  Mononeuritis 07/20/2014   Obesity (BMI 30-39.9) 07/20/2014   Essential hypertension 07/20/2014   Neurocardiogenic syncope 07/06/2014   Normocytic anemia 06/08/2014   POTS (postural orthostatic tachycardia syndrome) 06/08/2014   Hypotension 06/03/2014   Anal fissure 09/16/2013   Adrenal insufficiency (HCC) 05/30/2013   Encounter for long-term (current) use of high-risk medication 05/30/2013   Fall 05/30/2013   Hemorrhage of rectum and anus 05/30/2013   Other long term (current) drug therapy 05/30/2013   Syncope and collapse 05/30/2013   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/30/2013   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/2013   Congenital syphilitic osteochondritis    Gastroesophageal reflux disease without esophagitis 03/07/2012   Other diseases of trachea and bronchus 08/30/2011   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

## 2021-09-02 ENCOUNTER — Ambulatory Visit: Payer: BC Managed Care – PPO | Admitting: Infectious Disease

## 2021-09-06 ENCOUNTER — Ambulatory Visit (HOSPITAL_COMMUNITY): Payer: BC Managed Care – PPO | Admitting: Physical Therapy

## 2021-09-06 ENCOUNTER — Other Ambulatory Visit: Payer: Self-pay

## 2021-09-06 DIAGNOSIS — R262 Difficulty in walking, not elsewhere classified: Secondary | ICD-10-CM

## 2021-09-06 DIAGNOSIS — S91302A Unspecified open wound, left foot, initial encounter: Secondary | ICD-10-CM

## 2021-09-06 DIAGNOSIS — S91301A Unspecified open wound, right foot, initial encounter: Secondary | ICD-10-CM

## 2021-09-06 NOTE — Therapy (Signed)
[facility] [address] [place], [place], [address] Phone: [phone]   Fax:  [phone]  Wound Care Therapy  Patient Details  Name: [name] MRN: [id number] Date of Birth: Nov 20, 1965 Referring Provider (PT): Aleatha Borer   Encounter Date: 09/06/2021   PT End of Session - 09/06/21 1422     Visit Number 4    Number of Visits 16    Date for PT Re-Evaluation 10/13/21    Authorization Type BCBS (no auth, No VL)    Progress Note Due on Visit 10    PT Start Time 1050    PT Stop Time 1130    PT Time Calculation (min) 40 min    Activity Tolerance Patient tolerated treatment well    Behavior During Therapy [facility] for tasks assessed/performed             Past Medical History:  Diagnosis Date   Chronic cough    Chronic pain    Diabetes mellitus without complication (HCC)    Dyspnea    GERD (gastroesophageal reflux disease)    Hypokalemia    Hypothyroidism    Left wrist pain 06/01/2021   Myonecrosis (HCC) 07/01/2021   Neurocardiogenic syncope    OSA (obstructive sleep apnea)    does not use CPAP   Osteomyelitis (HCC)    bilateral feet   Peripheral neuropathy    Presence of intrathecal pump    recieves Prialt/bupivicaine   Tear of gluteus medius tendon 07/01/2021   Wegener's granulomatosis 2009   Wegner's disease (congenital syphilitic osteochondritis)     Past Surgical History:  Procedure Laterality Date   AMPUTATION Right 10/21/2020   Procedure: Right hallux amputation;  Surgeon: Toni Arthurs, MD;  Location: [facility];  Service: Orthopedics;  Laterality: Right;   AMPUTATION TOE Bilateral 08/12/2020   Procedure: Right 3rd toe amputation; left hallux and 3rd toe amputation;  Surgeon: Toni Arthurs, MD;  Location: [facility];  Service: Orthopedics;  Laterality: Bilateral;    AMPUTATION TOE Right 03/24/2021   Procedure: AMPUTATION TOE;  Surgeon: Toni Arthurs, MD;  Location: [facility];   Service: Orthopedics;  Laterality: Right;   APPENDECTOMY     BACK SURGERY     CHOLECYSTECTOMY     COLONOSCOPY N/A 06/02/2013   Procedure: COLONOSCOPY;  Surgeon: Graylin Shiver, MD;  Location: [facility];  Service: Endoscopy;  Laterality: N/A;   HERNIA REPAIR  2000   INCISION AND DRAINAGE ABSCESS Left 07/07/2016   Procedure: DEBRIDMENT LEFT THIGH ABSCESS, EXISION ACUTE SKIN RASH LEFT THIGH(1CM LESION);  Surgeon: Claud Kelp, MD;  [facility];  Service: General;  Laterality: Left;   SEPTOPLASTY  02/2013   spleen anuyism     splenic aneurysm     TRANSMETATARSAL AMPUTATION Bilateral 10/21/2020   Procedure: Left transmetatarsal amputation;  Surgeon: Toni Arthurs, MD;  Location: [facility];  Service: Orthopedics;  Laterality: Bilateral;   TRANSMETATARSAL AMPUTATION Left 03/24/2021   Procedure: TRANSMETATARSAL AMPUTATION;  Surgeon: Toni Arthurs, MD;  Location: [facility];  Service: Orthopedics;  Laterality: Left;   VAGINAL HYSTERECTOMY      There were no vitals filed for this visit.               Wound Therapy - 09/06/21 0001     Subjective Pt has no pain    Patient and Family Stated Goals wounds to heal    Date of Onset 05/23/21    Prior Treatments none  Pain Scale 0-10    Pain Score 0-No pain    Pain Location Foot    Pain Orientation Right;Left    Evaluation and Treatment Procedures Explained to Patient/Family Yes    Evaluation and Treatment Procedures agreed to    Wound Properties Date First Assessed: 08/25/21 Time First Assessed: 1500 Location: Foot Location Orientation: Anterior;Right Wound Description (Comments): Lateral and plantar wound Present on Admission: No Final Assessment Date: 09/06/21 Final Assessment Time: 1100   Wound Properties Date First Assessed: 08/25/21 Time First Assessed: 1500 Location: Ankle Location Orientation: Anterior;Right Wound Description (Comments): R dorsal foot Present on Admission: Yes   Dressing Type Impregnated gauze  (bismuth)    Dressing Changed Changed    Dressing Status Old drainage    Dressing Change Frequency PRN    Site / Wound Assessment Granulation tissue    % Wound base Red or Granulating 50%    % Wound base Yellow/Fibrinous Exudate 50%    Margins Epibole (rolled edges)    Drainage Amount Scant    Drainage Description Serous    Treatment Cleansed;Debridement (Selective)    Wound Properties Date First Assessed: 08/18/21 Time First Assessed: 1000 Location: Foot Location Orientation: Anterior;Left;Lateral Wound Description (Comments): lateral foot wound Present on Admission: Yes   Dressing Type Impregnated gauze (bismuth)    Dressing Changed Changed    Dressing Status Old drainage    Dressing Change Frequency PRN    Site / Wound Assessment Granulation tissue    % Wound base Red or Granulating 100%    Peri-wound Assessment Erythema (blanchable)    Margins Epibole (rolled edges)    Drainage Amount Scant    Drainage Description Serous    Treatment Cleansed;Debridement (Selective)    Wound Properties Date First Assessed: 08/18/21 Time First Assessed: 1000 Location: Pretibial Location Orientation: Distal;Left Wound Description (Comments): anterior shin wound Present on Admission: Yes   Dressing Type Impregnated gauze (bismuth)    Dressing Changed Changed    Site / Wound Assessment Granulation tissue    % Wound base Red or Granulating 85%    % Wound base Yellow/Fibrinous Exudate 15%    Margins Attached edges (approximated)    Drainage Amount Scant    Drainage Description Serous    Treatment Cleansed;Debridement (Selective)    Selective Debridement - Location wound beds and periwound    Selective Debridement - Tools Used Forceps;Scissors    Selective Debridement - Tissue Removed devitiaized tissue/slough    Wound Therapy - Clinical Statement dressing intact but has significant dog hair on them.  Explained to keep socks on to keep dressings cleaner.  Pt states that she has not had an ABI or  tests for her circulation.  Therapist called referring MD office and requested ABI at [facility] which they were agreeable; pt should be getting a call to have the test set up this week.    Wound Therapy - Functional Problem List walking, standing    Factors Delaying/Impairing Wound Healing Altered sensation;Diabetes Mellitus;Immobility;Multiple medical problems    Hydrotherapy Plan Debridement;Dressing change;Patient/family education;Pulsatile lavage with suction    Wound Therapy - Frequency 2X / week    Wound Plan measure RLE wounds weekly, debride and dressing changes to promote healing; f/u regarding how to get wound care supplies for home use    Dressing  vaseline to bilateral feet, xeroform to all wounds, kerlix,  #8 netting   no use of coban until ABI shows no arterial blockage.  PT Short Term Goals - 08/18/21 1042       PT SHORT TERM GOAL #1   Title Patient will demonstrate at least 50% reduction in wound size to reduce risk of infection.    Time 4    Period Weeks    Status New    Target Date 09/15/21               PT Long Term Goals - 08/18/21 1043       PT LONG TERM GOAL #1   Title Patient wound to be healed.    Time 8    Period Weeks    Status New    Target Date 10/13/21      PT LONG TERM GOAL #2   Title Patient will verbalize proper skin care techniques.    Time 8    Period Weeks    Status New    Target Date 10/13/21                   Plan - 09/06/21 1422     Clinical Impression Statement see above    Personal Factors and Comorbidities Comorbidity 3+;Time since onset of injury/illness/exacerbation    Comorbidities neuropathy, DM, chronic pain, hx weounds, hx toe amputations, osteomyelitis, Wegner's granulomatosis    Examination-Activity Limitations Locomotion Level;Bathing;Transfers;Squat;Stairs;Stand;Hygiene/Grooming    Examination-Participation Restrictions Cleaning;Community Activity;Shop     Stability/Clinical Decision Making Evolving/Moderate complexity    Rehab Potential Fair    PT Frequency 2x / week    PT Duration 8 weeks    PT Treatment/Interventions ADLs/Self Care Home Management;Gait training;Stair training;Functional mobility training;Therapeutic activities;Therapeutic exercise;Balance training;DME Instruction;Manual techniques;Manual lymph drainage;Compression bandaging;Taping;Neuromuscular re-education;Patient/family education    PT Next Visit Plan dressing and debride to promote wound healing    Consulted and Agree with Plan of Care Patient             Patient will benefit from skilled therapeutic intervention in order to improve the following deficits and impairments:  Abnormal gait, Difficulty walking, Decreased activity tolerance, Pain, Decreased skin integrity, Decreased mobility  Visit Diagnosis: Difficulty in walking, not elsewhere classified  Open wound of left foot, initial encounter  Wound of right foot     Problem List Patient Active Problem List   Diagnosis Date Noted   Myonecrosis ([place]) 07/01/2021   Tear of gluteus medius tendon 07/01/2021   Left wrist pain 06/01/2021   Medication monitoring encounter 05/10/2021   Fever and chills 04/22/2021   Osteomyelitis ([place]) 03/24/2021   Chronic pain 03/24/2021   COVID-19 virus infection 11/01/2020   Diarrhea 11/01/2020   Dehydration 11/01/2020   Generalized abdominal pain 10/09/2018   Midline low back pain 09/11/2018   Malfunction of intrathecal infusion pump 09/11/2018   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   Steroid-induced hyperglycemia 05/11/2018   Weakness 05/02/2018   Morbid obesity ([place]) 01/25/2018   Neuropathy 01/25/2018   Presence of intrathecal pump 08/02/2017   Rectocele 03/09/2017   Urinary hesitancy 03/09/2017   Abnormal finding on imaging 12/19/2016   Esophageal dysphagia 12/19/2016   Current chronic use of systemic steroids 10/26/2016   IFG (impaired fasting glucose)  10/26/2016   Arthralgia of multiple joints 07/15/2016   Headache 07/15/2016   Maculopapular rash, generalized 07/15/2016   Peripheral neuropathy 07/04/2016   Rash 07/04/2016   CKD (chronic kidney disease) stage 3, GFR 30-59 ml/min (HCC) 07/04/2016   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   Neuropathic pain of both feet 01/11/2016   OSA (obstructive sleep apnea) 12/29/2015   Chronic insomnia  12/29/2015   Mixed hyperlipidemia 12/29/2015   Obesity, Class II, BMI 35-39.9, with comorbidity 12/29/2015   Polyneuropathy associated with underlying disease (HCC) 12/29/2015   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) 12/29/2015   Class 2 drug-induced obesity with serious comorbidity and body mass index (BMI) of 35.0 to 35.9 in adult 12/29/2015   Other mechanical complication of implanted electronic neurostimulator of spinal cord electrode (lead), initial encounter (HCC) 08/04/2015   Adjustment disorder with depressed mood 03/29/2015   Hyperlipidemia 07/20/2014   Mononeuritis 07/20/2014   Obesity (BMI 30-39.9) 07/20/2014   Essential hypertension 07/20/2014   Neurocardiogenic syncope 07/06/2014   Normocytic anemia 06/08/2014   POTS (postural orthostatic tachycardia syndrome) 06/08/2014   Hypotension 06/03/2014   Anal fissure 09/16/2013   Adrenal insufficiency (HCC) 05/30/2013   Encounter for long-term (current) use of high-risk medication 05/30/2013   Fall 05/30/2013   Hemorrhage of rectum and anus 05/30/2013   Other long term (current) drug therapy 05/30/2013   Syncope and collapse 05/30/2013   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/30/2013   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/2013   Congenital syphilitic osteochondritis    Gastroesophageal reflux disease without esophagitis 03/07/2012   Other diseases of trachea and bronchus 08/30/2011   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 [place], [place], [address] Phone: [phone]   Fax:  [phone]  Name: [name] MRN: [id number] Date of Birth: 12-13-1965

## 2021-09-08 ENCOUNTER — Other Ambulatory Visit: Payer: Self-pay

## 2021-09-08 ENCOUNTER — Encounter (HOSPITAL_COMMUNITY): Payer: Self-pay | Admitting: Physical Therapy

## 2021-09-08 ENCOUNTER — Ambulatory Visit (HOSPITAL_COMMUNITY)
Admission: RE | Admit: 2021-09-08 | Discharge: 2021-09-08 | Disposition: A | Discharge status: Home / Self Care | Payer: BC Managed Care – PPO | Source: Ambulatory Visit | Attending: Nurse Practitioner | Admitting: Nurse Practitioner

## 2021-09-08 ENCOUNTER — Ambulatory Visit (HOSPITAL_COMMUNITY): Payer: BC Managed Care – PPO | Admitting: Physical Therapy

## 2021-09-08 ENCOUNTER — Other Ambulatory Visit (HOSPITAL_COMMUNITY)
Admission: RE | Admit: 2021-09-08 | Discharge: 2021-09-08 | Disposition: A | Discharge status: Home / Self Care | Payer: BC Managed Care – PPO | Source: Ambulatory Visit | Attending: Nurse Practitioner | Admitting: Nurse Practitioner

## 2021-09-08 ENCOUNTER — Other Ambulatory Visit (HOSPITAL_COMMUNITY): Payer: Self-pay | Admitting: Nurse Practitioner

## 2021-09-08 DIAGNOSIS — B9689 Other specified bacterial agents as the cause of diseases classified elsewhere: Secondary | ICD-10-CM | POA: Diagnosis present

## 2021-09-08 DIAGNOSIS — R262 Difficulty in walking, not elsewhere classified: Secondary | ICD-10-CM

## 2021-09-08 DIAGNOSIS — J208 Acute bronchitis due to other specified organisms: Secondary | ICD-10-CM | POA: Insufficient documentation

## 2021-09-08 DIAGNOSIS — S91302A Unspecified open wound, left foot, initial encounter: Secondary | ICD-10-CM

## 2021-09-08 DIAGNOSIS — S91301A Unspecified open wound, right foot, initial encounter: Secondary | ICD-10-CM

## 2021-09-08 LAB — CBC WITH DIFFERENTIAL/PLATELET
Abs Immature Granulocytes: 0.08 10*3/uL — ABNORMAL HIGH (ref 0.00–0.07)
Basophils Absolute: 0.1 10*3/uL (ref 0.0–0.1)
Basophils Relative: 0 %
Eosinophils Absolute: 0.2 10*3/uL (ref 0.0–0.5)
Eosinophils Relative: 2 %
HCT: 37.4 % (ref 36.0–46.0)
Hemoglobin: 11.3 g/dL — ABNORMAL LOW (ref 12.0–15.0)
Immature Granulocytes: 1 %
Lymphocytes Relative: 33 %
Lymphs Abs: 4.6 10*3/uL — ABNORMAL HIGH (ref 0.7–4.0)
MCH: 25.3 pg — ABNORMAL LOW (ref 26.0–34.0)
MCHC: 30.2 g/dL (ref 30.0–36.0)
MCV: 83.7 fL (ref 80.0–100.0)
Monocytes Absolute: 1.1 10*3/uL — ABNORMAL HIGH (ref 0.1–1.0)
Monocytes Relative: 8 %
Neutro Abs: 7.9 10*3/uL — ABNORMAL HIGH (ref 1.7–7.7)
Neutrophils Relative %: 56 %
Platelets: 471 10*3/uL — ABNORMAL HIGH (ref 150–400)
RBC: 4.47 MIL/uL (ref 3.87–5.11)
RDW: 18.6 % — ABNORMAL HIGH (ref 11.5–15.5)
WBC: 14 10*3/uL — ABNORMAL HIGH (ref 4.0–10.5)
nRBC: 0 % (ref 0.0–0.2)

## 2021-09-08 LAB — COMPREHENSIVE METABOLIC PANEL
ALT: 21 U/L (ref 0–44)
AST: 17 U/L (ref 15–41)
Albumin: 3.4 g/dL — ABNORMAL LOW (ref 3.5–5.0)
Alkaline Phosphatase: 78 U/L (ref 38–126)
Anion gap: 9 (ref 5–15)
BUN: 13 mg/dL (ref 6–20)
CO2: 25 mmol/L (ref 22–32)
Calcium: 9 mg/dL (ref 8.9–10.3)
Chloride: 104 mmol/L (ref 98–111)
Creatinine, Ser: 1.2 mg/dL — ABNORMAL HIGH (ref 0.44–1.00)
GFR, Estimated: 53 mL/min — ABNORMAL LOW (ref >60)
Glucose, Bld: 100 mg/dL — ABNORMAL HIGH (ref 70–99)
Potassium: 3.1 mmol/L — ABNORMAL LOW (ref 3.5–5.1)
Sodium: 138 mmol/L (ref 135–145)
Total Bilirubin: 0.7 mg/dL (ref 0.3–1.2)
Total Protein: 6.4 g/dL — ABNORMAL LOW (ref 6.5–8.1)

## 2021-09-08 NOTE — Therapy (Signed)
[facility], [place], [address] Phone: [phone]   Fax:  [phone]  Wound Care Therapy  Patient Details  Name: [name] MRN: [id number] Date of Birth: May 01, 1966 Referring Provider (PT): Fonnie Jarvis   Encounter Date: 09/08/2021   PT End of Session - 09/08/21 1217     Visit Number 5    Number of Visits 16    Date for PT Re-Evaluation 10/13/21    Authorization Type BCBS (no auth, No VL)    Progress Note Due on Visit 10    PT Start Time 1132    PT Stop Time 1215    PT Time Calculation (min) 43 min    Activity Tolerance Patient tolerated treatment well    Behavior During Therapy [facility] for tasks assessed/performed             Past Medical History:  Diagnosis Date   Chronic cough    Chronic pain    Diabetes mellitus without complication (HCC)    Dyspnea    GERD (gastroesophageal reflux disease)    Hypokalemia    Hypothyroidism    Left wrist pain 06/01/2021   Myonecrosis ([place]) 07/01/2021   Neurocardiogenic syncope    OSA (obstructive sleep apnea)    does not use CPAP   Osteomyelitis ([place])    bilateral feet   Peripheral neuropathy    Presence of intrathecal pump    recieves Prialt/bupivicaine   Tear of gluteus medius tendon 07/01/2021   Wegener's granulomatosis 2009   Wegner's disease (congenital syphilitic osteochondritis)     Past Surgical History:  Procedure Laterality Date   AMPUTATION Right 10/21/2020   Procedure: Right hallux amputation;  Surgeon: Wylene Simmer, MD;  Location: [place];  Service: Orthopedics;  Laterality: Right;   AMPUTATION TOE Bilateral 08/12/2020   Procedure: Right 3rd toe amputation; left hallux and 3rd toe amputation;  Surgeon: Wylene Simmer, MD;  Location: [place];  Service: Orthopedics;  Laterality: Bilateral;  55min   AMPUTATION TOE Right 03/24/2021   Procedure: AMPUTATION TOE;  Surgeon: Wylene Simmer, MD;  Location: [place];   Service: Orthopedics;  Laterality: Right;   APPENDECTOMY     BACK SURGERY     CHOLECYSTECTOMY     COLONOSCOPY N/A 06/02/2013   Procedure: COLONOSCOPY;  Surgeon: Wonda Horner, MD;  Location: [facility];  Service: Endoscopy;  Laterality: N/A;   HERNIA REPAIR  2000   INCISION AND DRAINAGE ABSCESS Left 07/07/2016   Procedure: DEBRIDMENT LEFT THIGH ABSCESS, EXISION ACUTE SKIN RASH LEFT THIGH(1CM LESION);  Surgeon: Fanny Skates, MD;  Location: [facility];  Service: General;  Laterality: Left;   SEPTOPLASTY  02/2013   spleen anuyism     splenic aneurysm     TRANSMETATARSAL AMPUTATION Bilateral 10/21/2020   Procedure: Left transmetatarsal amputation;  Surgeon: Wylene Simmer, MD;  Location: [place];  Service: Orthopedics;  Laterality: Bilateral;   TRANSMETATARSAL AMPUTATION Left 03/24/2021   Procedure: TRANSMETATARSAL AMPUTATION;  Surgeon: Wylene Simmer, MD;  Location: [place];  Service: Orthopedics;  Laterality: Left;   VAGINAL HYSTERECTOMY      There were no vitals filed for this visit.               Wound Therapy - 09/08/21 0001     Subjective Pt has no pain. Has left the dressings on.    Patient and Family Stated Goals wounds to heal    Date of Onset 05/23/21  Prior Treatments none    Pain Score 0-No pain    Evaluation and Treatment Procedures Explained to Patient/Family Yes    Evaluation and Treatment Procedures agreed to    Wound Properties Date First Assessed: 08/25/21 Time First Assessed: 1500 Location: Ankle Location Orientation: Anterior;Right Wound Description (Comments): R dorsal foot Present on Admission: Yes   Dressing Type Impregnated gauze (bismuth)    Dressing Changed Changed    Dressing Status Old drainage    Dressing Change Frequency PRN    Site / Wound Assessment Granulation tissue    % Wound base Red or Granulating 60%    % Wound base Yellow/Fibrinous Exudate 40%    Margins Attached edges (approximated)    Drainage Amount Scant    Drainage  Description Serous    Treatment Cleansed;Debridement (Selective)    Wound Properties Date First Assessed: 08/18/21 Time First Assessed: 1000 Location: Foot Location Orientation: Anterior;Left;Lateral Wound Description (Comments): lateral foot wound Present on Admission: Yes   Dressing Type Impregnated gauze (bismuth)    Dressing Changed Changed    Dressing Status Old drainage    Dressing Change Frequency PRN    Site / Wound Assessment Granulation tissue    % Wound base Red or Granulating 100%    Margins Epibole (rolled edges)    Drainage Amount Scant    Drainage Description Serous    Treatment Cleansed;Debridement (Selective)    Wound Properties Date First Assessed: 08/18/21 Time First Assessed: 1000 Location: Pretibial Location Orientation: Distal;Left Wound Description (Comments): anterior shin wound Present on Admission: Yes   Dressing Type Impregnated gauze (bismuth)    Dressing Changed Changed    Site / Wound Assessment Granulation tissue    % Wound base Red or Granulating 85%    % Wound base Yellow/Fibrinous Exudate 15%    Margins Attached edges (approximated)    Drainage Amount Scant    Drainage Description Serous    Treatment Cleansed;Debridement (Selective)    Selective Debridement - Location wound beds and periwound    Selective Debridement - Tools Used Forceps;Scissors    Selective Debridement - Tissue Removed devitiaized tissue/slough    Wound Therapy - Clinical Statement Patient arrives with dressings intact with some drainage from L lateral foot. Wounds appear to be healing well with small spot left at distal RLE of all granulation below callus. R dorsal wound with removable slough. L shin wound with small scab able to be removed with minimal drainage following. Patient tolerates debridement well and continued with same dressings. Patient has not heard back about ABI yet.    Wound Therapy - Functional Problem List walking, standing    Factors Delaying/Impairing Wound Healing  Altered sensation;Diabetes Mellitus;Immobility;Multiple medical problems    Hydrotherapy Plan Debridement;Dressing change;Patient/family education;Pulsatile lavage with suction    Wound Therapy - Frequency 2X / week    Wound Plan measure RLE wounds weekly, debride and dressing changes to promote healing; f/u regarding how to get wound care supplies for home use    Dressing  vaseline to bilateral feet, xeroform to all wounds, kerlix,  #8 netting   no use of coban until ABI shows no arterial blockage.                      PT Short Term Goals - 08/18/21 1042       PT SHORT TERM GOAL #1   Title Patient will demonstrate at least 50% reduction in wound size to reduce risk of infection.    Time 4  Period Weeks    Status New    Target Date 09/15/21               PT Long Term Goals - 08/18/21 1043       PT LONG TERM GOAL #1   Title Patient wound to be healed.    Time 8    Period Weeks    Status New    Target Date 10/13/21      PT LONG TERM GOAL #2   Title Patient will verbalize proper skin care techniques.    Time 8    Period Weeks    Status New    Target Date 10/13/21                   Plan - 09/08/21 1217     Clinical Impression Statement see above    Personal Factors and Comorbidities Comorbidity 3+;Time since onset of injury/illness/exacerbation    Comorbidities neuropathy, DM, chronic pain, hx weounds, hx toe amputations, osteomyelitis, Wegner's granulomatosis    Examination-Activity Limitations Locomotion Level;Bathing;Transfers;Squat;Stairs;Stand;Hygiene/Grooming    Examination-Participation Restrictions Cleaning;Community Activity;Shop    Stability/Clinical Decision Making Evolving/Moderate complexity    Rehab Potential Fair    PT Frequency 2x / week    PT Duration 8 weeks    PT Treatment/Interventions ADLs/Self Care Home Management;Gait training;Stair training;Functional mobility training;Therapeutic activities;Therapeutic  exercise;Balance training;DME Instruction;Manual techniques;Manual lymph drainage;Compression bandaging;Taping;Neuromuscular re-education;Patient/family education    PT Next Visit Plan dressing and debride to promote wound healing    Consulted and Agree with Plan of Care Patient             Patient will benefit from skilled therapeutic intervention in order to improve the following deficits and impairments:  Abnormal gait, Difficulty walking, Decreased activity tolerance, Pain, Decreased skin integrity, Decreased mobility  Visit Diagnosis: Difficulty in walking, not elsewhere classified  Open wound of left foot, initial encounter  Wound of right foot     Problem List Patient Active Problem List   Diagnosis Date Noted   Myonecrosis ([place]) 07/01/2021   Tear of gluteus medius tendon 07/01/2021   Left wrist pain 06/01/2021   Medication monitoring encounter 05/10/2021   Fever and chills 04/22/2021   Osteomyelitis ([place]) 03/24/2021   Chronic pain 03/24/2021   COVID-19 virus infection 11/01/2020   Diarrhea 11/01/2020   Dehydration 11/01/2020   Generalized abdominal pain 10/09/2018   Midline low back pain 09/11/2018   Malfunction of intrathecal infusion pump 09/11/2018   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   Steroid-induced hyperglycemia 05/11/2018   Weakness 05/02/2018   Morbid obesity ([place]) 01/25/2018   Neuropathy 01/25/2018   Presence of intrathecal pump 08/02/2017   Rectocele 03/09/2017   Urinary hesitancy 03/09/2017   Abnormal finding on imaging 12/19/2016   Esophageal dysphagia 12/19/2016   Current chronic use of systemic steroids 10/26/2016   IFG (impaired fasting glucose) 10/26/2016   Arthralgia of multiple joints 07/15/2016   Headache 07/15/2016   Maculopapular rash, generalized 07/15/2016   Peripheral neuropathy 07/04/2016   Rash 07/04/2016   CKD (chronic kidney disease) stage 3, GFR 30-59 ml/min (HCC) 07/04/2016   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   Neuropathic pain of both feet 01/11/2016   OSA (obstructive sleep apnea) 12/29/2015   Chronic insomnia 12/29/2015   Mixed hyperlipidemia 12/29/2015   Obesity, Class II, BMI 35-39.9, with comorbidity 12/29/2015   Polyneuropathy associated with underlying disease ([place]) 12/29/2015   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 ([place]) 12/29/2015   Class 2 drug-induced obesity with serious comorbidity and  body mass index (BMI) of 35.0 to 35.9 in adult 12/29/2015   Other mechanical complication of implanted electronic neurostimulator of spinal cord electrode (lead), initial encounter (HCC) 08/04/2015   Adjustment disorder with depressed mood 03/29/2015   Hyperlipidemia 07/20/2014   Mononeuritis 07/20/2014   Obesity (BMI 30-39.9) 07/20/2014   Essential hypertension 07/20/2014   Neurocardiogenic syncope 07/06/2014   Normocytic anemia 06/08/2014   POTS (postural orthostatic tachycardia syndrome) 06/08/2014   Hypotension 06/03/2014   Anal fissure 09/16/2013   Adrenal insufficiency (HCC) 05/30/2013   Encounter for long-term (current) use of high-risk medication 05/30/2013   Fall 05/30/2013   Hemorrhage of rectum and anus 05/30/2013   Other long term (current) drug therapy 05/30/2013   Syncope and collapse 05/30/2013   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/30/2013   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/2013   Congenital syphilitic osteochondritis    Gastroesophageal reflux disease without esophagitis 03/07/2012   Other diseases of trachea and bronchus 08/30/2011   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  [place], [place], [address] Phone: [phone]   Fax:  [phone]  Name: Mikhaela Zaugg MRN: [id number] Date of Birth: [birth year]

## 2021-09-12 ENCOUNTER — Other Ambulatory Visit: Payer: Self-pay

## 2021-09-12 ENCOUNTER — Encounter (HOSPITAL_COMMUNITY): Payer: Self-pay | Admitting: Physical Therapy

## 2021-09-12 ENCOUNTER — Other Ambulatory Visit: Payer: Self-pay | Admitting: Infectious Disease

## 2021-09-12 ENCOUNTER — Ambulatory Visit (HOSPITAL_COMMUNITY): Payer: BC Managed Care – PPO | Admitting: Physical Therapy

## 2021-09-12 DIAGNOSIS — S91302A Unspecified open wound, left foot, initial encounter: Secondary | ICD-10-CM

## 2021-09-12 DIAGNOSIS — S91301A Unspecified open wound, right foot, initial encounter: Secondary | ICD-10-CM

## 2021-09-12 DIAGNOSIS — R262 Difficulty in walking, not elsewhere classified: Secondary | ICD-10-CM | POA: Diagnosis not present

## 2021-09-12 NOTE — Therapy (Signed)
[facility] [address] [place], [place], [address] Phone: [phone]   Fax:  [phone]  Wound Care Therapy  Patient Details  Name: [name] MRN: [id number] Date of Birth: 1966/06/22 Referring Provider (PT): Aleatha Borer   Encounter Date: 09/12/2021   PT End of Session - 09/12/21 1047     Visit Number 6    Number of Visits 16    Date for PT Re-Evaluation 10/13/21    Authorization Type BCBS (no auth, No VL)    Progress Note Due on Visit 10    PT Start Time 1047    PT Stop Time 1120    PT Time Calculation (min) 33 min    Activity Tolerance Patient tolerated treatment well    Behavior During Therapy [facility] for tasks assessed/performed             Past Medical History:  Diagnosis Date   Chronic cough    Chronic pain    Diabetes mellitus without complication (HCC)    Dyspnea    GERD (gastroesophageal reflux disease)    Hypokalemia    Hypothyroidism    Left wrist pain 06/01/2021   Myonecrosis (HCC) 07/01/2021   Neurocardiogenic syncope    OSA (obstructive sleep apnea)    does not use CPAP   Osteomyelitis (HCC)    bilateral feet   Peripheral neuropathy    Presence of intrathecal pump    recieves Prialt/bupivicaine   Tear of gluteus medius tendon 07/01/2021   Wegener's granulomatosis 2009   Wegner's disease (congenital syphilitic osteochondritis)     Past Surgical History:  Procedure Laterality Date   AMPUTATION Right 10/21/2020   Procedure: Right hallux amputation;  Surgeon: Toni Arthurs, MD;  Location: [facility];  Service: Orthopedics;  Laterality: Right;   AMPUTATION TOE Bilateral 08/12/2020   Procedure: Right 3rd toe amputation; left hallux and 3rd toe amputation;  Surgeon: Toni Arthurs, MD;  Location: [facility];  Service: Orthopedics;  Laterality: Bilateral;    AMPUTATION TOE Right 03/24/2021   Procedure: AMPUTATION TOE;  Surgeon: Toni Arthurs, MD;  Location: [facility];   Service: Orthopedics;  Laterality: Right;   APPENDECTOMY     BACK SURGERY     CHOLECYSTECTOMY     COLONOSCOPY N/A 06/02/2013   Procedure: COLONOSCOPY;  Surgeon: Graylin Shiver, MD;  Location: [facility];  Service: Endoscopy;  Laterality: N/A;   HERNIA REPAIR  2000   INCISION AND DRAINAGE ABSCESS Left 07/07/2016   Procedure: DEBRIDMENT LEFT THIGH ABSCESS, EXISION ACUTE SKIN RASH LEFT THIGH(1CM LESION);  Surgeon: Claud Kelp, MD;  Location: [facility];  Service: General;  Laterality: Left;   SEPTOPLASTY  02/2013   spleen anuyism     splenic aneurysm     TRANSMETATARSAL AMPUTATION Bilateral 10/21/2020   Procedure: Left transmetatarsal amputation;  Surgeon: Toni Arthurs, MD;  Location: [facility];  Service: Orthopedics;  Laterality: Bilateral;   TRANSMETATARSAL AMPUTATION Left 03/24/2021   Procedure: TRANSMETATARSAL AMPUTATION;  Surgeon: Toni Arthurs, MD;  Location: [facility];  Service: Orthopedics;  Laterality: Left;   VAGINAL HYSTERECTOMY      There were no vitals filed for this visit.               Wound Therapy - 09/12/21 0001     Subjective Her neuropathy is bothering her today    Patient and Family Stated Goals wounds to heal    Date of Onset 05/23/21    Prior Treatments none  Pain Score 0-No pain    Evaluation and Treatment Procedures Explained to Patient/Family Yes    Evaluation and Treatment Procedures agreed to    Wound Properties Date First Assessed: 08/25/21 Time First Assessed: 1500 Location: Ankle Location Orientation: Anterior;Right Wound Description (Comments): R dorsal foot Present on Admission: Yes   Dressing Type Impregnated gauze (bismuth)    Dressing Changed Changed    Dressing Status Old drainage    Dressing Change Frequency PRN    Site / Wound Assessment Granulation tissue    % Wound base Red or Granulating 75%    % Wound base Yellow/Fibrinous Exudate 25%    Margins Attached edges (approximated)    Drainage Amount Scant    Drainage  Description Serous    Treatment Cleansed;Debridement (Selective)    Wound Properties Date First Assessed: 08/18/21 Time First Assessed: 1000 Location: Foot Location Orientation: Anterior;Left;Lateral Wound Description (Comments): lateral foot wound Present on Admission: Yes   Dressing Type Impregnated gauze (bismuth)    Dressing Changed Changed    Dressing Status Old drainage    Dressing Change Frequency PRN    Site / Wound Assessment Granulation tissue    % Wound base Red or Granulating 100%    Margins Epibole (rolled edges)    Drainage Amount Minimal    Drainage Description Serosanguineous    Treatment Cleansed;Debridement (Selective)    Wound Properties Date First Assessed: 08/18/21 Time First Assessed: 1000 Location: Pretibial Location Orientation: Distal;Left Wound Description (Comments): anterior shin wound Present on Admission: Yes   Dressing Type Impregnated gauze (bismuth)    Dressing Changed Changed    Dressing Status Dry    Site / Wound Assessment Granulation tissue    % Wound base Red or Granulating 100%    Margins Attached edges (approximated)    Drainage Amount Scant    Drainage Description Serous    Treatment Cleansed;Debridement (Selective)    Selective Debridement - Location wound beds and periwound    Selective Debridement - Tools Used Forceps;Scalpel    Selective Debridement - Tissue Removed devitiaized tissue/slough    Wound Therapy - Clinical Statement Wounds appear to be healing well with increase in granulation tissue noted after removal of scabs/slough. Patient tolerates debridement well. Patient educated on leaving dressings on and only changing if soiled.    Wound Therapy - Functional Problem List walking, standing    Factors Delaying/Impairing Wound Healing Altered sensation;Diabetes Mellitus;Immobility;Multiple medical problems    Hydrotherapy Plan Debridement;Dressing change;Patient/family education;Pulsatile lavage with suction    Wound Therapy - Frequency  2X / week    Wound Plan debride and dressing changes to promote healing; f/u regarding how to get wound care supplies for home use    Dressing  vaseline to bilateral feet, xeroform to all wounds, kerlix,  #8 netting   no use of coban until ABI shows no arterial blockage.                      PT Short Term Goals - 08/18/21 1042       PT SHORT TERM GOAL #1   Title Patient will demonstrate at least 50% reduction in wound size to reduce risk of infection.    Time 4    Period Weeks    Status New    Target Date 09/15/21               PT Long Term Goals - 08/18/21 1043       PT LONG TERM GOAL #1  Title Patient wound to be healed.    Time 8    Period Weeks    Status New    Target Date 10/13/21      PT LONG TERM GOAL #2   Title Patient will verbalize proper skin care techniques.    Time 8    Period Weeks    Status New    Target Date 10/13/21                   Plan - 09/12/21 1047     Clinical Impression Statement see above    Personal Factors and Comorbidities Comorbidity 3+;Time since onset of injury/illness/exacerbation    Comorbidities neuropathy, DM, chronic pain, hx weounds, hx toe amputations, osteomyelitis, Wegner's granulomatosis    Examination-Activity Limitations Locomotion Level;Bathing;Transfers;Squat;Stairs;Stand;Hygiene/Grooming    Examination-Participation Restrictions Cleaning;Community Activity;Shop    Stability/Clinical Decision Making Evolving/Moderate complexity    Rehab Potential Fair    PT Frequency 2x / week    PT Duration 8 weeks    PT Treatment/Interventions ADLs/Self Care Home Management;Gait training;Stair training;Functional mobility training;Therapeutic activities;Therapeutic exercise;Balance training;DME Instruction;Manual techniques;Manual lymph drainage;Compression bandaging;Taping;Neuromuscular re-education;Patient/family education    PT Next Visit Plan dressing and debride to promote wound healing    Consulted and  Agree with Plan of Care Patient             Patient will benefit from skilled therapeutic intervention in order to improve the following deficits and impairments:  Abnormal gait, Difficulty walking, Decreased activity tolerance, Pain, Decreased skin integrity, Decreased mobility  Visit Diagnosis: Difficulty in walking, not elsewhere classified  Open wound of left foot, initial encounter  Wound of right foot     Problem List Patient Active Problem List   Diagnosis Date Noted   Myonecrosis (HCC) 07/01/2021   Tear of gluteus medius tendon 07/01/2021   Left wrist pain 06/01/2021   Medication monitoring encounter 05/10/2021   Fever and chills 04/22/2021   Osteomyelitis (HCC) 03/24/2021   Chronic pain 03/24/2021   COVID-19 virus infection 11/01/2020   Diarrhea 11/01/2020   Dehydration 11/01/2020   Generalized abdominal pain 10/09/2018   Midline low back pain 09/11/2018   Malfunction of intrathecal infusion pump 09/11/2018   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   Steroid-induced hyperglycemia 05/11/2018   Weakness 05/02/2018   Morbid obesity (HCC) 01/25/2018   Neuropathy 01/25/2018   Presence of intrathecal pump 08/02/2017   Rectocele 03/09/2017   Urinary hesitancy 03/09/2017   Abnormal finding on imaging 12/19/2016   Esophageal dysphagia 12/19/2016   Current chronic use of systemic steroids 10/26/2016   IFG (impaired fasting glucose) 10/26/2016   Arthralgia of multiple joints 07/15/2016   Headache 07/15/2016   Maculopapular rash, generalized 07/15/2016   Peripheral neuropathy 07/04/2016   Rash 07/04/2016   CKD (chronic kidney disease) stage 3, GFR 30-59 ml/min (HCC) 07/04/2016   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   Neuropathic pain of both feet 01/11/2016   OSA (obstructive sleep apnea) 12/29/2015   Chronic insomnia 12/29/2015   Mixed hyperlipidemia 12/29/2015   Obesity, Class II, BMI 35-39.9, with comorbidity 12/29/2015   Polyneuropathy  associated with underlying disease (HCC) 12/29/2015   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) 12/29/2015   Class 2 drug-induced obesity with serious comorbidity and body mass index (BMI) of 35.0 to 35.9 in adult 12/29/2015   Other mechanical complication of implanted electronic neurostimulator of spinal cord electrode (lead), initial encounter (HCC) 08/04/2015   Adjustment disorder with depressed mood 03/29/2015   Hyperlipidemia 07/20/2014   Mononeuritis 07/20/2014  Obesity (BMI 30-39.9) 07/20/2014   Essential hypertension 07/20/2014   Neurocardiogenic syncope 07/06/2014   Normocytic anemia 06/08/2014   POTS (postural orthostatic tachycardia syndrome) 06/08/2014   Hypotension 06/03/2014   Anal fissure 09/16/2013   Adrenal insufficiency ([place]) 05/30/2013   Encounter for long-term (current) use of high-risk medication 05/30/2013   Fall 05/30/2013   Hemorrhage of rectum and anus 05/30/2013   Other long term (current) drug therapy 05/30/2013   Syncope and collapse 05/30/2013   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[place]) 05/30/2013   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[place]) 05/30/2013   Congenital syphilitic osteochondritis    Gastroesophageal reflux disease without esophagitis 03/07/2012   Other diseases of trachea and bronchus 08/30/2011   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

## 2021-09-14 ENCOUNTER — Ambulatory Visit (HOSPITAL_COMMUNITY): Payer: BC Managed Care – PPO | Admitting: Physical Therapy

## 2021-09-14 ENCOUNTER — Other Ambulatory Visit (HOSPITAL_COMMUNITY): Payer: Self-pay | Admitting: Nurse Practitioner

## 2021-09-14 DIAGNOSIS — R0989 Other specified symptoms and signs involving the circulatory and respiratory systems: Secondary | ICD-10-CM

## 2021-09-20 ENCOUNTER — Other Ambulatory Visit: Payer: Self-pay

## 2021-09-20 ENCOUNTER — Ambulatory Visit (HOSPITAL_COMMUNITY): Payer: BC Managed Care – PPO | Admitting: Physical Therapy

## 2021-09-20 DIAGNOSIS — S91301A Unspecified open wound, right foot, initial encounter: Secondary | ICD-10-CM

## 2021-09-20 DIAGNOSIS — R262 Difficulty in walking, not elsewhere classified: Secondary | ICD-10-CM

## 2021-09-20 DIAGNOSIS — S91302A Unspecified open wound, left foot, initial encounter: Secondary | ICD-10-CM

## 2021-09-20 NOTE — Therapy (Signed)
[facility] [address] [place], [place], [address] Phone: [phone]   Fax:  [phone]  Wound Care Therapy  Patient Details  Name: [name] MRN: [id number] Date of Birth: Jan 19, 1966 Referring Provider (PT): Aleatha Borer   Encounter Date: 09/20/2021   PT End of Session - 09/20/21 1159     Visit Number 7    Number of Visits 16    Date for PT Re-Evaluation 10/13/21    Authorization Type BCBS (no auth, No VL)    Progress Note Due on Visit 10    PT Start Time 1055    PT Stop Time 1130    PT Time Calculation (min) 35 min    Activity Tolerance Patient tolerated treatment well    Behavior During Therapy [facility] for tasks assessed/performed             Past Medical History:  Diagnosis Date   Chronic cough    Chronic pain    Diabetes mellitus without complication (HCC)    Dyspnea    GERD (gastroesophageal reflux disease)    Hypokalemia    Hypothyroidism    Left wrist pain 06/01/2021   Myonecrosis (HCC) 07/01/2021   Neurocardiogenic syncope    OSA (obstructive sleep apnea)    does not use CPAP   Osteomyelitis (HCC)    bilateral feet   Peripheral neuropathy    Presence of intrathecal pump    recieves Prialt/bupivicaine   Tear of gluteus medius tendon 07/01/2021   Wegener's granulomatosis 2009   Wegner's disease (congenital syphilitic osteochondritis)     Past Surgical History:  Procedure Laterality Date   AMPUTATION Right 10/21/2020   Procedure: Right hallux amputation;  Surgeon: Toni Arthurs, MD;  Location: [facility];  Service: Orthopedics;  Laterality: Right;   AMPUTATION TOE Bilateral 08/12/2020   Procedure: Right 3rd toe amputation; left hallux and 3rd toe amputation;  Surgeon: Toni Arthurs, MD;  Location: [facility];  Service: Orthopedics;  Laterality: Bilateral;    AMPUTATION TOE Right 03/24/2021   Procedure: AMPUTATION TOE;  Surgeon: Toni Arthurs, MD;  Location: [facility];   Service: Orthopedics;  Laterality: Right;   APPENDECTOMY     BACK SURGERY     CHOLECYSTECTOMY     COLONOSCOPY N/A 06/02/2013   Procedure: COLONOSCOPY;  Surgeon: Graylin Shiver, MD;  Location: [facility];  Service: Endoscopy;  Laterality: N/A;   HERNIA REPAIR  2000   INCISION AND DRAINAGE ABSCESS Left 07/07/2016   Procedure: DEBRIDMENT LEFT THIGH ABSCESS, EXISION ACUTE SKIN RASH LEFT THIGH(1CM LESION);  Surgeon: Claud Kelp, MD;  Location: [facility];  Service: General;  Laterality: Left;   SEPTOPLASTY  02/2013   spleen anuyism     splenic aneurysm     TRANSMETATARSAL AMPUTATION Bilateral 10/21/2020   Procedure: Left transmetatarsal amputation;  Surgeon: Toni Arthurs, MD;  Location: [facility];  Service: Orthopedics;  Laterality: Bilateral;   TRANSMETATARSAL AMPUTATION Left 03/24/2021   Procedure: TRANSMETATARSAL AMPUTATION;  Surgeon: Toni Arthurs, MD;  Location: [facility] OR;  Service: Orthopedics;  Laterality: Left;   VAGINAL HYSTERECTOMY      There were no vitals filed for this visit.               Wound Therapy - 09/20/21 1146     Subjective pt states she had to change her bandages but overall feeling better.    Patient and Family Stated Goals wounds to heal    Date of Onset 05/23/21  Prior Treatments none    Pain Scale 0-10    Pain Score 0-No pain    Evaluation and Treatment Procedures Explained to Patient/Family Yes    Evaluation and Treatment Procedures agreed to    Wound Properties Date First Assessed: 08/25/21 Time First Assessed: 1500 Location: Ankle Location Orientation: Anterior;Right Wound Description (Comments): R dorsal foot Present on Admission: Yes   Dressing Type Impregnated gauze (bismuth)    Dressing Changed Changed    Dressing Status Old drainage    Dressing Change Frequency PRN    Site / Wound Assessment Granulation tissue    % Wound base Red or Granulating 100%    Margins Attached edges (approximated)    Drainage Amount None    Treatment  Debridement (Selective);Cleansed    Wound Properties Date First Assessed: 08/18/21 Time First Assessed: 1000 Location: Foot Location Orientation: Anterior;Left;Lateral Wound Description (Comments): lateral foot wound Present on Admission: Yes   Dressing Type Impregnated gauze (bismuth)    Dressing Changed Changed    Dressing Status Old drainage    Dressing Change Frequency PRN    Site / Wound Assessment Granulation tissue    % Wound base Red or Granulating 100%    Margins Epibole (rolled edges)    Drainage Amount Minimal    Drainage Description Serosanguineous    Treatment Cleansed;Debridement (Selective)    Wound Properties Date First Assessed: 08/18/21 Time First Assessed: 1000 Location: Pretibial Location Orientation: Distal;Left Wound Description (Comments): anterior shin wound Present on Admission: Yes   Dressing Type Impregnated gauze (bismuth)    Dressing Changed Changed    Dressing Status Dry    Site / Wound Assessment Granulation tissue    % Wound base Red or Granulating 100%    Margins Attached edges (approximated)    Drainage Amount None    Treatment Cleansed;Debridement (Selective)    Selective Debridement - Location wound beds and periwound    Selective Debridement - Tools Used Forceps;Scalpel    Selective Debridement - Tissue Removed devitiaized tissue/slough    Wound Therapy - Clinical Statement No wounds remain on Rt LE, only dry skin distal stump and raw area in dorsal crease.  Moisturized well and placed xeroform over area just to keep moist.  Anticipate full healing of Rt next visit.  Lt foot wound is also healing nicely and remains 100% granulated. Multiple areas along Lt anterior LE also healing and 100% granulation    Wound Therapy - Functional Problem List walking, standing    Factors Delaying/Impairing Wound Healing Altered sensation;Diabetes Mellitus;Immobility;Multiple medical problems    Hydrotherapy Plan Debridement;Dressing change;Patient/family  education;Pulsatile lavage with suction    Wound Therapy - Frequency --   decrease to 1X week trial (pt will change at home if needed)   Wound Plan debride and dressing changes to promote healing; f/u regarding how to get wound care supplies for home use    Dressing  vaseline to bilateral feet, xeroform to all wounds, kerlix,  #3 netting   no use of coban until ABI shows no arterial blockage.                      PT Short Term Goals - 08/18/21 1042       PT SHORT TERM GOAL #1   Title Patient will demonstrate at least 50% reduction in wound size to reduce risk of infection.    Time 4    Period Weeks    Status New    Target Date 09/15/21  PT Long Term Goals - 08/18/21 1043       PT LONG TERM GOAL #1   Title Patient wound to be healed.    Time 8    Period Weeks    Status New    Target Date 10/13/21      PT LONG TERM GOAL #2   Title Patient will verbalize proper skin care techniques.    Time 8    Period Weeks    Status New    Target Date 10/13/21                    Patient will benefit from skilled therapeutic intervention in order to improve the following deficits and impairments:     Visit Diagnosis: Difficulty in walking, not elsewhere classified  Wound of right foot  Open wound of left foot, initial encounter     Problem List Patient Active Problem List   Diagnosis Date Noted   Myonecrosis ([place]) 07/01/2021   Tear of gluteus medius tendon 07/01/2021   Left wrist pain 06/01/2021   Medication monitoring encounter 05/10/2021   Fever and chills 04/22/2021   Osteomyelitis ([place]) 03/24/2021   Chronic pain 03/24/2021   COVID-19 virus infection 11/01/2020   Diarrhea 11/01/2020   Dehydration 11/01/2020   Generalized abdominal pain 10/09/2018   Midline low back pain 09/11/2018   Malfunction of intrathecal infusion pump 09/11/2018   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   Steroid-induced hyperglycemia 05/11/2018    Weakness 05/02/2018   Morbid obesity ([place]) 01/25/2018   Neuropathy 01/25/2018   Presence of intrathecal pump 08/02/2017   Rectocele 03/09/2017   Urinary hesitancy 03/09/2017   Abnormal finding on imaging 12/19/2016   Esophageal dysphagia 12/19/2016   Current chronic use of systemic steroids 10/26/2016   IFG (impaired fasting glucose) 10/26/2016   Arthralgia of multiple joints 07/15/2016   Headache 07/15/2016   Maculopapular rash, generalized 07/15/2016   Peripheral neuropathy 07/04/2016   Rash 07/04/2016   CKD (chronic kidney disease) stage 3, GFR 30-59 ml/min (HCC) 07/04/2016   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   Neuropathic pain of both feet 01/11/2016   OSA (obstructive sleep apnea) 12/29/2015   Chronic insomnia 12/29/2015   Mixed hyperlipidemia 12/29/2015   Obesity, Class II, BMI 35-39.9, with comorbidity 12/29/2015   Polyneuropathy associated with underlying disease ([place]) 12/29/2015   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 ([place]) 12/29/2015   Class 2 drug-induced obesity with serious comorbidity and body mass index (BMI) of 35.0 to 35.9 in adult 12/29/2015   Other mechanical complication of implanted electronic neurostimulator of spinal cord electrode (lead), initial encounter ([place]) 08/04/2015   Adjustment disorder with depressed mood 03/29/2015   Hyperlipidemia 07/20/2014   Mononeuritis 07/20/2014   Obesity (BMI 30-39.9) 07/20/2014   Essential hypertension 07/20/2014   Neurocardiogenic syncope 07/06/2014   Normocytic anemia 06/08/2014   POTS (postural orthostatic tachycardia syndrome) 06/08/2014   Hypotension 06/03/2014   Anal fissure 09/16/2013   Adrenal insufficiency ([place]) 05/30/2013   Encounter for long-term (current) use of high-risk medication 05/30/2013   Fall 05/30/2013   Hemorrhage of rectum and anus 05/30/2013   Other long term (current) drug therapy 05/30/2013   Syncope and collapse 05/30/2013   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 [place], [place], [address] Phone: [phone]   Fax:  [phone]  Name: Naesha Yeung MRN: [id number] Date of Birth: 1966-02-28

## 2021-09-22 ENCOUNTER — Ambulatory Visit (HOSPITAL_COMMUNITY): Payer: BC Managed Care – PPO | Attending: Nurse Practitioner | Admitting: Physical Therapy

## 2021-09-22 DIAGNOSIS — S91301A Unspecified open wound, right foot, initial encounter: Secondary | ICD-10-CM | POA: Insufficient documentation

## 2021-09-22 DIAGNOSIS — R262 Difficulty in walking, not elsewhere classified: Secondary | ICD-10-CM | POA: Insufficient documentation

## 2021-09-22 DIAGNOSIS — S91302A Unspecified open wound, left foot, initial encounter: Secondary | ICD-10-CM | POA: Insufficient documentation

## 2021-09-26 ENCOUNTER — Ambulatory Visit (HOSPITAL_COMMUNITY)
Admission: RE | Admit: 2021-09-26 | Discharge: 2021-09-26 | Disposition: A | Discharge status: Home / Self Care | Payer: BC Managed Care – PPO | Source: Ambulatory Visit | Attending: Nurse Practitioner | Admitting: Nurse Practitioner

## 2021-09-26 DIAGNOSIS — R0989 Other specified symptoms and signs involving the circulatory and respiratory systems: Secondary | ICD-10-CM | POA: Insufficient documentation

## 2021-09-26 NOTE — Progress Notes (Signed)
VASCULAR LAB    ABIs have been performed.  See CV proc for preliminary results.   Libbie Bartley, RVT 09/26/2021, 10:47 AM

## 2021-09-27 ENCOUNTER — Ambulatory Visit (HOSPITAL_COMMUNITY): Payer: BC Managed Care – PPO | Admitting: Physical Therapy

## 2021-09-27 ENCOUNTER — Other Ambulatory Visit: Payer: Self-pay

## 2021-09-27 DIAGNOSIS — S91302A Unspecified open wound, left foot, initial encounter: Secondary | ICD-10-CM

## 2021-09-27 DIAGNOSIS — R262 Difficulty in walking, not elsewhere classified: Secondary | ICD-10-CM

## 2021-09-27 DIAGNOSIS — S91301A Unspecified open wound, right foot, initial encounter: Secondary | ICD-10-CM

## 2021-09-27 NOTE — Therapy (Signed)
[facility] [place], [place], [address] Phone: [phone]   Fax:  [phone]  Wound Care Therapy  Patient Details  Name: [name] MRN: [id number] Date of Birth: 1966/02/27 Referring Provider (PT): Fonnie Jarvis   Encounter Date: 09/27/2021   PT End of Session - 09/27/21 1105     Visit Number 8    Number of Visits 16    Date for PT Re-Evaluation 10/13/21    Authorization Type BCBS (no auth, No VL)    Progress Note Due on Visit 10    PT Start Time 0925    PT Stop Time 1000    PT Time Calculation (min) 35 min    Activity Tolerance Patient tolerated treatment well    Behavior During Therapy [facility] for tasks assessed/performed             Past Medical History:  Diagnosis Date   Chronic cough    Chronic pain    Diabetes mellitus without complication (HCC)    Dyspnea    GERD (gastroesophageal reflux disease)    Hypokalemia    Hypothyroidism    Left wrist pain 06/01/2021   Myonecrosis ([place]) 07/01/2021   Neurocardiogenic syncope    OSA (obstructive sleep apnea)    does not use CPAP   Osteomyelitis ([place])    bilateral feet   Peripheral neuropathy    Presence of intrathecal pump    recieves Prialt/bupivicaine   Tear of gluteus medius tendon 07/01/2021   Wegener's granulomatosis 2009   Wegner's disease (congenital syphilitic osteochondritis)     Past Surgical History:  Procedure Laterality Date   AMPUTATION Right 10/21/2020   Procedure: Right hallux amputation;  Surgeon: Wylene Simmer, MD;  Location: [place] [facility];  Service: Orthopedics;  Laterality: Right;   AMPUTATION TOE Bilateral 08/12/2020   Procedure: Right 3rd toe amputation; left hallux and 3rd toe amputation;  Surgeon: Wylene Simmer, MD;  Location: [place];  Service: Orthopedics;  Laterality: Bilateral;  108min   AMPUTATION TOE Right 03/24/2021   Procedure: AMPUTATION TOE;  Surgeon: Wylene Simmer, MD;  Location: [place];  Service:  Orthopedics;  Laterality: Right;   APPENDECTOMY     BACK SURGERY     CHOLECYSTECTOMY     COLONOSCOPY N/A 06/02/2013   Procedure: COLONOSCOPY;  Surgeon: Wonda Horner, MD;  Location: [facility];  Service: Endoscopy;  Laterality: N/A;   HERNIA REPAIR  2000   INCISION AND DRAINAGE ABSCESS Left 07/07/2016   Procedure: DEBRIDMENT LEFT THIGH ABSCESS, EXISION ACUTE SKIN RASH LEFT THIGH(1CM LESION);  Surgeon: Fanny Skates, MD;  Location: [facility];  Service: General;  Laterality: Left;   SEPTOPLASTY  02/2013   spleen anuyism     splenic aneurysm     TRANSMETATARSAL AMPUTATION Bilateral 10/21/2020   Procedure: Left transmetatarsal amputation;  Surgeon: Wylene Simmer, MD;  Location: [place];  Service: Orthopedics;  Laterality: Bilateral;   TRANSMETATARSAL AMPUTATION Left 03/24/2021   Procedure: TRANSMETATARSAL AMPUTATION;  Surgeon: Wylene Simmer, MD;  Location: [place];  Service: Orthopedics;  Laterality: Left;   VAGINAL HYSTERECTOMY      There were no vitals filed for this visit.               Wound Therapy - 09/27/21 1043     Subjective pt states she walked across her floor without shoes or dressings on and her Lt foot started bleeding.  States "i messed up"  Pt without pain or complaints other  than ongoing aches/pains in LE's not related to wounds.    Patient and Family Stated Goals wounds to heal    Date of Onset 05/23/21    Prior Treatments none    Pain Scale 0-10    Pain Score 0-No pain    Evaluation and Treatment Procedures Explained to Patient/Family Yes    Evaluation and Treatment Procedures agreed to    Wound Properties Date First Assessed: 08/25/21 Time First Assessed: 1500 Location: Ankle Location Orientation: Anterior;Right Wound Description (Comments): R dorsal foot Present on Admission: Yes   Wound Image Images linked: 1    Treatment Other (Comment)   no further treatment needed   Wound Properties Date First Assessed: 08/18/21 Time First Assessed: 1000  Location: Foot Location Orientation: Anterior;Left;Lateral Wound Description (Comments): lateral foot wound Present on Admission: Yes   Wound Image Images linked: 1    Dressing Type Impregnated gauze (bismuth)    Dressing Changed Changed    Dressing Status Old drainage    Dressing Change Frequency PRN    Site / Wound Assessment Granulation tissue    % Wound base Red or Granulating 100%    Wound Length (cm) 1.5 cm   was 3cm   Wound Width (cm) 1 cm   was 3.6cm   Wound Depth (cm) 0 cm    Wound Volume (cm^3) 0 cm^3    Wound Surface Area (cm^2) 1.5 cm^2    Margins Attached edges (approximated)    Drainage Amount Scant    Drainage Description Serosanguineous    Treatment Cleansed;Debridement (Selective)    Wound Properties Date First Assessed: 08/18/21 Time First Assessed: 1000 Location: Pretibial Location Orientation: Distal;Left Wound Description (Comments): anterior shin wound Present on Admission: Yes   Treatment Other (Comment)   no treatment needed, healed   Selective Debridement - Location Lt lateral foot only    Selective Debridement - Tools Used Forceps    Selective Debridement - Tissue Removed devitiaized tissue/slough    Wound Therapy - Clinical Statement All wounds on Rt foot and Lt LE now healed.   Only Lt lateral wound remains which is 100% granulated and approximating well.  Continued to use xeroform after debriding away devitalized tissue.  Decreased to use of 2X2 and medipore tape as well.    Wound Therapy - Functional Problem List walking, standing    Factors Delaying/Impairing Wound Healing Altered sensation;Diabetes Mellitus;Immobility;Multiple medical problems    Hydrotherapy Plan Debridement;Dressing change;Patient/family education;Pulsatile lavage with suction    Wound Therapy - Frequency --   decrease to 1X week trial (pt will change at home if needed)   Wound Plan debride and dressing changes to promote healing; decreased to 1X week due to wounds are nearly healed.     Dressing  vaseline to bilateral feet, Lt lateral foot:  xeroform, 2X2 and medipore tape   no use of coban until ABI shows no arterial blockage.                    PT Education - 09/27/21 1057     Education Details Pt with questions on self debridement; instructed NOT to complete this as supplies should be sterile and left to therapists to determine what tissue is viable.  Instructed to continue dressing with xeroform, gauze and medipore.  Instructed to always walk with something on her feet due to neuropathy.    Person(s) Educated Patient;Spouse    Methods Explanation    Comprehension Verbalized understanding  PT Short Term Goals - 08/18/21 1042       PT SHORT TERM GOAL #1   Title Patient will demonstrate at least 50% reduction in wound size to reduce risk of infection.    Time 4    Period Weeks    Status New    Target Date 09/15/21               PT Long Term Goals - 08/18/21 1043       PT LONG TERM GOAL #1   Title Patient wound to be healed.    Time 8    Period Weeks    Status New    Target Date 10/13/21      PT LONG TERM GOAL #2   Title Patient will verbalize proper skin care techniques.    Time 8    Period Weeks    Status New    Target Date 10/13/21                    Patient will benefit from skilled therapeutic intervention in order to improve the following deficits and impairments:     Visit Diagnosis: Difficulty in walking, not elsewhere classified  Wound of right foot  Open wound of left foot, initial encounter     Problem List Patient Active Problem List   Diagnosis Date Noted   Myonecrosis (HCC) 07/01/2021   Tear of gluteus medius tendon 07/01/2021   Left wrist pain 06/01/2021   Medication monitoring encounter 05/10/2021   Fever and chills 04/22/2021   Osteomyelitis (HCC) 03/24/2021   Chronic pain 03/24/2021   COVID-19 virus infection 11/01/2020   Diarrhea 11/01/2020   Dehydration 11/01/2020    Generalized abdominal pain 10/09/2018   Midline low back pain 09/11/2018   Malfunction of intrathecal infusion pump 09/11/2018   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   Steroid-induced hyperglycemia 05/11/2018   Weakness 05/02/2018   Morbid obesity (HCC) 01/25/2018   Neuropathy 01/25/2018   Presence of intrathecal pump 08/02/2017   Rectocele 03/09/2017   Urinary hesitancy 03/09/2017   Abnormal finding on imaging 12/19/2016   Esophageal dysphagia 12/19/2016   Current chronic use of systemic steroids 10/26/2016   IFG (impaired fasting glucose) 10/26/2016   Arthralgia of multiple joints 07/15/2016   Headache 07/15/2016   Maculopapular rash, generalized 07/15/2016   Peripheral neuropathy 07/04/2016   Rash 07/04/2016   CKD (chronic kidney disease) stage 3, GFR 30-59 ml/min (HCC) 07/04/2016   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   Neuropathic pain of both feet 01/11/2016   OSA (obstructive sleep apnea) 12/29/2015   Chronic insomnia 12/29/2015   Mixed hyperlipidemia 12/29/2015   Obesity, Class II, BMI 35-39.9, with comorbidity 12/29/2015   Polyneuropathy associated with underlying disease (HCC) 12/29/2015   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) 12/29/2015   Class 2 drug-induced obesity with serious comorbidity and body mass index (BMI) of 35.0 to 35.9 in adult 12/29/2015   Other mechanical complication of implanted electronic neurostimulator of spinal cord electrode (lead), initial encounter (HCC) 08/04/2015   Adjustment disorder with depressed mood 03/29/2015   Hyperlipidemia 07/20/2014   Mononeuritis 07/20/2014   Obesity (BMI 30-39.9) 07/20/2014   Essential hypertension 07/20/2014   Neurocardiogenic syncope 07/06/2014   Normocytic anemia 06/08/2014   POTS (postural orthostatic tachycardia syndrome) 06/08/2014   Hypotension 06/03/2014   Anal fissure 09/16/2013   Adrenal insufficiency (HCC) 05/30/2013   Encounter for long-term (current) use  of high-risk medication 05/30/2013   Fall 05/30/2013   Hemorrhage of rectum and anus  05/30/2013   Other long term (current) drug therapy 05/30/2013   Syncope and collapse 05/30/2013   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/30/2013   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/30/2013   Congenital syphilitic osteochondritis    Gastroesophageal reflux disease without esophagitis 03/07/2012   Other diseases of trachea and bronchus 08/30/2011   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

## 2021-09-29 ENCOUNTER — Ambulatory Visit (HOSPITAL_COMMUNITY): Payer: BC Managed Care – PPO

## 2021-10-04 ENCOUNTER — Ambulatory Visit (HOSPITAL_COMMUNITY): Payer: BC Managed Care – PPO | Admitting: Physical Therapy

## 2021-10-04 ENCOUNTER — Other Ambulatory Visit: Payer: Self-pay

## 2021-10-04 DIAGNOSIS — S91301A Unspecified open wound, right foot, initial encounter: Secondary | ICD-10-CM

## 2021-10-04 DIAGNOSIS — R262 Difficulty in walking, not elsewhere classified: Secondary | ICD-10-CM | POA: Diagnosis not present

## 2021-10-04 DIAGNOSIS — S91302A Unspecified open wound, left foot, initial encounter: Secondary | ICD-10-CM

## 2021-10-04 NOTE — Therapy (Signed)
[facility] [address] [place], [place], [address] Phone: [phone]   Fax:  [phone]  Wound Care Therapy  Patient Details  Name: [name] MRN: [id number] Date of Birth: 1965/12/17 Referring Provider (PT): Aleatha Borer   Encounter Date: 10/04/2021   PT End of Session - 10/04/21 1055     Visit Number 9    Number of Visits 16    Date for PT Re-Evaluation 10/13/21    Authorization Type BCBS (no auth, No VL)    Progress Note Due on Visit 10    PT Start Time 0920    PT Stop Time 0950    PT Time Calculation (min) 30 min    Activity Tolerance Patient tolerated treatment well    Behavior During Therapy [facility] for tasks assessed/performed             Past Medical History:  Diagnosis Date   Chronic cough    Chronic pain    Diabetes mellitus without complication (HCC)    Dyspnea    GERD (gastroesophageal reflux disease)    Hypokalemia    Hypothyroidism    Left wrist pain 06/01/2021   Myonecrosis (HCC) 07/01/2021   Neurocardiogenic syncope    OSA (obstructive sleep apnea)    does not use CPAP   Osteomyelitis (HCC)    bilateral feet   Peripheral neuropathy    Presence of intrathecal pump    recieves Prialt/bupivicaine   Tear of gluteus medius tendon 07/01/2021   Wegener's granulomatosis 2009   Wegner's disease (congenital syphilitic osteochondritis)     Past Surgical History:  Procedure Laterality Date   AMPUTATION Right 10/21/2020   Procedure: Right hallux amputation;  Surgeon: Toni Arthurs, MD;  Location: [facility];  Service: Orthopedics;  Laterality: Right;   AMPUTATION TOE Bilateral 08/12/2020   Procedure: Right 3rd toe amputation; left hallux and 3rd toe amputation;  Surgeon: Toni Arthurs, MD;  Location: [facility];  Service: Orthopedics;  Laterality: Bilateral;    AMPUTATION TOE Right 03/24/2021   Procedure: AMPUTATION TOE;  Surgeon: Toni Arthurs, MD;  Location: [facility];   Service: Orthopedics;  Laterality: Right;   APPENDECTOMY     BACK SURGERY     CHOLECYSTECTOMY     COLONOSCOPY N/A 06/02/2013   Procedure: COLONOSCOPY;  Surgeon: Graylin Shiver, MD;  Location: [facility];  Service: Endoscopy;  Laterality: N/A;   HERNIA REPAIR  2000   INCISION AND DRAINAGE ABSCESS Left 07/07/2016   Procedure: DEBRIDMENT LEFT THIGH ABSCESS, EXISION ACUTE SKIN RASH LEFT THIGH(1CM LESION);  Surgeon: Claud Kelp, MD;  Location: [facility];  Service: General;  Laterality: Left;   SEPTOPLASTY  02/2013   spleen anuyism     splenic aneurysm     TRANSMETATARSAL AMPUTATION Bilateral 10/21/2020   Procedure: Left transmetatarsal amputation;  Surgeon: Toni Arthurs, MD;  Location: [facility];  Service: Orthopedics;  Laterality: Bilateral;   TRANSMETATARSAL AMPUTATION Left 03/24/2021   Procedure: TRANSMETATARSAL AMPUTATION;  Surgeon: Toni Arthurs, MD;  Location: [facility];  Service: Orthopedics;  Laterality: Left;   VAGINAL HYSTERECTOMY      There were no vitals filed for this visit.               Wound Therapy - 10/04/21 0001     Subjective pt states it is better.    Patient and Family Stated Goals wounds to heal    Date of Onset 05/23/21    Prior Treatments none  Pain Scale 0-10    Pain Score 0-No pain    Evaluation and Treatment Procedures Explained to Patient/Family Yes    Evaluation and Treatment Procedures agreed to    Wound Properties Date First Assessed: 08/18/21 Time First Assessed: 1000 Location: Foot Location Orientation: Anterior;Left;Lateral Wound Description (Comments): lateral foot wound Present on Admission: Yes   Dressing Type Impregnated gauze (bismuth)    Dressing Changed Changed    Dressing Status Old drainage    Dressing Change Frequency PRN    Site / Wound Assessment Granulation tissue    % Wound base Red or Granulating 100%    Wound Length (cm) 0.8 cm    Wound Width (cm) 0.3 cm    Wound Depth (cm) 0 cm    Wound Volume (cm^3) 0 cm^3     Wound Surface Area (cm^2) 0.24 cm^2    Margins Attached edges (approximated)    Drainage Amount Scant    Drainage Description Serous    Treatment Cleansed;Debridement (Selective)    Selective Debridement - Location Lt lateral foot only    Selective Debridement - Tools Used Forceps    Selective Debridement - Tissue Removed devitiaized tissue/slough    Wound Therapy - Clinical Statement Wound measured with drastic reduction.  Good approximation and scant drainage of woundbed.  Anticipate full closure within the next couple of weeks.continued with xeroform, 2X2 and medipore    Wound Therapy - Functional Problem List walking, standing    Factors Delaying/Impairing Wound Healing Altered sensation;Diabetes Mellitus;Immobility;Multiple medical problems    Hydrotherapy Plan Debridement;Dressing change;Patient/family education;Pulsatile lavage with suction    Wound Therapy - Frequency --   1X week   Wound Plan debride and dressing changes to promote healing; decreased to 1X week due to wounds are nearly healed.    Dressing  vaseline to bilateral feet, Lt lateral foot:  xeroform, 2X2 and medipore tape                       PT Short Term Goals - 08/18/21 1042       PT SHORT TERM GOAL #1   Title Patient will demonstrate at least 50% reduction in wound size to reduce risk of infection.    Time 4    Period Weeks    Status New    Target Date 09/15/21               PT Long Term Goals - 08/18/21 1043       PT LONG TERM GOAL #1   Title Patient wound to be healed.    Time 8    Period Weeks    Status New    Target Date 10/13/21      PT LONG TERM GOAL #2   Title Patient will verbalize proper skin care techniques.    Time 8    Period Weeks    Status New    Target Date 10/13/21                    Patient will benefit from skilled therapeutic intervention in order to improve the following deficits and impairments:     Visit Diagnosis: Difficulty in walking,  not elsewhere classified  Wound of right foot  Open wound of left foot, initial encounter     Problem List Patient Active Problem List   Diagnosis Date Noted   Myonecrosis (HCC) 07/01/2021   Tear of gluteus medius tendon 07/01/2021   Left wrist pain 06/01/2021  Medication monitoring encounter 05/10/2021   Fever and chills 04/22/2021   Osteomyelitis (HCC) 03/24/2021   Chronic pain 03/24/2021   COVID-19 virus infection 11/01/2020   Diarrhea 11/01/2020   Dehydration 11/01/2020   Generalized abdominal pain 10/09/2018   Midline low back pain 09/11/2018   Malfunction of intrathecal infusion pump 09/11/2018   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   Steroid-induced hyperglycemia 05/11/2018   Weakness 05/02/2018   Morbid obesity ([place]) 01/25/2018   Neuropathy 01/25/2018   Presence of intrathecal pump 08/02/2017   Rectocele 03/09/2017   Urinary hesitancy 03/09/2017   Abnormal finding on imaging 12/19/2016   Esophageal dysphagia 12/19/2016   Current chronic use of systemic steroids 10/26/2016   IFG (impaired fasting glucose) 10/26/2016   Arthralgia of multiple joints 07/15/2016   Headache 07/15/2016   Maculopapular rash, generalized 07/15/2016   Peripheral neuropathy 07/04/2016   Rash 07/04/2016   CKD (chronic kidney disease) stage 3, GFR 30-59 ml/min (HCC) 07/04/2016   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   Neuropathic pain of both feet 01/11/2016   OSA (obstructive sleep apnea) 12/29/2015   Chronic insomnia 12/29/2015   Mixed hyperlipidemia 12/29/2015   Obesity, Class II, BMI 35-39.9, with comorbidity 12/29/2015   Polyneuropathy associated with underlying disease ([place]) 12/29/2015   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 ([place]) 12/29/2015   Class 2 drug-induced obesity with serious comorbidity and body mass index (BMI) of 35.0 to 35.9 in adult 12/29/2015   Other mechanical complication of implanted electronic neurostimulator of spinal cord  electrode (lead), initial encounter ([place]) 08/04/2015   Adjustment disorder with depressed mood 03/29/2015   Hyperlipidemia 07/20/2014   Mononeuritis 07/20/2014   Obesity (BMI 30-39.9) 07/20/2014   Essential hypertension 07/20/2014   Neurocardiogenic syncope 07/06/2014   Normocytic anemia 06/08/2014   POTS (postural orthostatic tachycardia syndrome) 06/08/2014   Hypotension 06/03/2014   Anal fissure 09/16/2013   Adrenal insufficiency ([place]) 05/30/2013   Encounter for long-term (current) use of high-risk medication 05/30/2013   Fall 05/30/2013   Hemorrhage of rectum and anus 05/30/2013   Other long term (current) drug therapy 05/30/2013   Syncope and collapse 05/30/2013   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[place]) 05/30/2013   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 [place], [place], [address] Phone: [phone]   Fax:  [phone]  Name: Amrit Tilmon MRN: [id number] Date of Birth: November 22, 1965

## 2021-10-06 ENCOUNTER — Ambulatory Visit (HOSPITAL_COMMUNITY): Payer: BC Managed Care – PPO | Admitting: Physical Therapy

## 2021-10-11 ENCOUNTER — Other Ambulatory Visit: Payer: Self-pay

## 2021-10-11 ENCOUNTER — Ambulatory Visit (HOSPITAL_COMMUNITY): Payer: BC Managed Care – PPO | Admitting: Physical Therapy

## 2021-10-11 DIAGNOSIS — R262 Difficulty in walking, not elsewhere classified: Secondary | ICD-10-CM

## 2021-10-11 DIAGNOSIS — S91302A Unspecified open wound, left foot, initial encounter: Secondary | ICD-10-CM

## 2021-10-11 DIAGNOSIS — S91301A Unspecified open wound, right foot, initial encounter: Secondary | ICD-10-CM

## 2021-10-11 NOTE — Therapy (Addendum)
[place], [place], [address] Phone: [phone]   Fax:  [phone]  Wound Care Therapy/Discharge Summary  Patient Details  Name: [name] MRN: [id number] Date of Birth: 1966-04-02 Referring Provider (PT): Fonnie Jarvis   Encounter Date: 10/11/2021  PHYSICAL THERAPY DISCHARGE SUMMARY  Visits from Start of Care: 10  Current functional level related to goals / functional outcomes: Patient has met 1/1 short term goals and 1/2 long term goals with wound being almost healed that this point and ability to verbalize skin/wound care techniques.   Remaining deficits: Small lateral L foot wound remaining   Education / Equipment: Xeroform, wound/skin care techniques   Patient agrees to discharge. Patient goals were partially met. Patient is being discharged due to  Patient wanting to transition to self care of wound with spouse.  2:35 PM, 10/11/21 Mearl Latin PT, DPT Physical Therapist at [facility]    PT End of Session - 10/11/21 1251     Visit Number 10    Number of Visits 16    Date for PT Re-Evaluation 10/13/21    Authorization Type BCBS (no auth, No VL)    Progress Note Due on Visit 10    PT Start Time 1050    PT Stop Time 1120    PT Time Calculation (min) 30 min    Activity Tolerance Patient tolerated treatment well    Behavior During Therapy [facility] for tasks assessed/performed             Past Medical History:  Diagnosis Date   Chronic cough    Chronic pain    Diabetes mellitus without complication (HCC)    Dyspnea    GERD (gastroesophageal reflux disease)    Hypokalemia    Hypothyroidism    Left wrist pain 06/01/2021   Myonecrosis ([place]) 07/01/2021   Neurocardiogenic syncope    OSA (obstructive sleep apnea)    does not use CPAP   Osteomyelitis (HCC)    bilateral feet   Peripheral neuropathy    Presence of intrathecal pump    recieves Prialt/bupivicaine    Tear of gluteus medius tendon 07/01/2021   Wegener's granulomatosis 2009   Wegner's disease (congenital syphilitic osteochondritis)     Past Surgical History:  Procedure Laterality Date   AMPUTATION Right 10/21/2020   Procedure: Right hallux amputation;  Surgeon: Wylene Simmer, MD;  Location: [place];  Service: Orthopedics;  Laterality: Right;   AMPUTATION TOE Bilateral 08/12/2020   Procedure: Right 3rd toe amputation; left hallux and 3rd toe amputation;  Surgeon: Wylene Simmer, MD;  Location: [place];  Service: Orthopedics;  Laterality: Bilateral;  46min   AMPUTATION TOE Right 03/24/2021   Procedure: AMPUTATION TOE;  Surgeon: Wylene Simmer, MD;  Location: [place];  Service: Orthopedics;  Laterality: Right;   APPENDECTOMY     BACK SURGERY     CHOLECYSTECTOMY     COLONOSCOPY N/A 06/02/2013   Procedure: COLONOSCOPY;  Surgeon: Wonda Horner, MD;  Location: [facility];  Service: Endoscopy;  Laterality: N/A;   HERNIA REPAIR  2000   INCISION AND DRAINAGE ABSCESS Left 07/07/2016   Procedure: DEBRIDMENT LEFT THIGH ABSCESS, EXISION ACUTE SKIN RASH LEFT THIGH(1CM LESION);  Surgeon: Fanny Skates, MD;  Location: [facility];  Service: General;  Laterality: Left;   SEPTOPLASTY  02/2013   spleen anuyism     splenic aneurysm     TRANSMETATARSAL AMPUTATION Bilateral 10/21/2020   Procedure: Left  transmetatarsal amputation;  Surgeon: Wylene Simmer, MD;  Location: [place];  Service: Orthopedics;  Laterality: Bilateral;   TRANSMETATARSAL AMPUTATION Left 03/24/2021   Procedure: TRANSMETATARSAL AMPUTATION;  Surgeon: Wylene Simmer, MD;  Location: [place];  Service: Orthopedics;  Laterality: Left;   VAGINAL HYSTERECTOMY      There were no vitals filed for this visit.               Wound Therapy - 10/11/21 1120     Subjective Pt and spouse report they have been changing dressings and taking care of the wound.  STates it is healing.    Patient and Family  Stated Goals wounds to heal    Date of Onset 05/23/21    Prior Treatments none    Pain Scale 0-10    Pain Score 0-No pain    Evaluation and Treatment Procedures Explained to Patient/Family Yes    Evaluation and Treatment Procedures agreed to    Wound Properties Date First Assessed: 08/18/21 Time First Assessed: 1000 Location: Foot Location Orientation: Anterior;Left;Lateral Wound Description (Comments): lateral foot wound Present on Admission: Yes   Wound Image Images linked: 1    Dressing Type Impregnated gauze (bismuth)    Dressing Changed Changed    Dressing Status Old drainage    Dressing Change Frequency PRN    Site / Wound Assessment Granulation tissue    % Wound base Red or Granulating 95%    Wound Length (cm) 0.8 cm    Wound Width (cm) 0.2 cm    Wound Depth (cm) 0 cm    Wound Volume (cm^3) 0 cm^3    Wound Surface Area (cm^2) 0.16 cm^2    Margins Attached edges (approximated)    Drainage Amount Scant    Drainage Description Serous    Treatment Cleansed;Debridement (Selective)    Selective Debridement - Location Lt lateral foot only    Selective Debridement - Tools Used Forceps    Selective Debridement - Tissue Removed devitiaized tissue/slough    Wound Therapy - Clinical Statement Wound measured and photographed.  continues to heal well and with patient and spouse taking care of wound.  Pt and spouse feel they continue to do this without skilled care at this time.  Instructed to keep it covered and only wear shoes that are not restrictive to area.  Pt given wound bag with additional xeroform to use on wound.  Pt is ready for discharge at this time.    Wound Therapy - Functional Problem List walking, standing    Factors Delaying/Impairing Wound Healing Altered sensation;Diabetes Mellitus;Immobility;Multiple medical problems    Hydrotherapy Plan Debridement;Dressing change;Patient/family education;Pulsatile lavage with suction    Wound Therapy - Frequency --   1X week   Wound  Plan discharge to self care.  Pateint and spouse in agreement.    Dressing  vaseline to bilateral feet, Lt lateral foot:  xeroform, 2X2 and medipore tape                       PT Short Term Goals - 08/18/21 1042       PT SHORT TERM GOAL #1   Title Patient will demonstrate at least 50% reduction in wound size to reduce risk of infection.    Time 4    Period Weeks    Status New    Target Date 09/15/21               PT Long Term Goals - 08/18/21 1043  PT LONG TERM GOAL #1   Title Patient wound to be healed.    Time 8    Period Weeks    Status New    Target Date 10/13/21      PT LONG TERM GOAL #2   Title Patient will verbalize proper skin care techniques.    Time 8    Period Weeks    Status New    Target Date 10/13/21                    Patient will benefit from skilled therapeutic intervention in order to improve the following deficits and impairments:     Visit Diagnosis: Difficulty in walking, not elsewhere classified  Open wound of left foot, initial encounter  Wound of right foot     Problem List Patient Active Problem List   Diagnosis Date Noted   Myonecrosis (HCC) 07/01/2021   Tear of gluteus medius tendon 07/01/2021   Left wrist pain 06/01/2021   Medication monitoring encounter 05/10/2021   Fever and chills 04/22/2021   Osteomyelitis (HCC) 03/24/2021   Chronic pain 03/24/2021   COVID-19 virus infection 11/01/2020   Diarrhea 11/01/2020   Dehydration 11/01/2020   Generalized abdominal pain 10/09/2018   Midline low back pain 09/11/2018   Malfunction of intrathecal infusion pump 09/11/2018   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   Steroid-induced hyperglycemia 05/11/2018   Weakness 05/02/2018   Morbid obesity (HCC) 01/25/2018   Neuropathy 01/25/2018   Presence of intrathecal pump 08/02/2017   Rectocele 03/09/2017   Urinary hesitancy 03/09/2017   Abnormal finding on imaging 12/19/2016   Esophageal dysphagia  12/19/2016   Current chronic use of systemic steroids 10/26/2016   IFG (impaired fasting glucose) 10/26/2016   Arthralgia of multiple joints 07/15/2016   Headache 07/15/2016   Maculopapular rash, generalized 07/15/2016   Peripheral neuropathy 07/04/2016   Rash 07/04/2016   CKD (chronic kidney disease) stage 3, GFR 30-59 ml/min (HCC) 07/04/2016   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   Neuropathic pain of both feet 01/11/2016   OSA (obstructive sleep apnea) 12/29/2015   Chronic insomnia 12/29/2015   Mixed hyperlipidemia 12/29/2015   Obesity, Class II, BMI 35-39.9, with comorbidity 12/29/2015   Polyneuropathy associated with underlying disease (HCC) 12/29/2015   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) 12/29/2015   Class 2 drug-induced obesity with serious comorbidity and body mass index (BMI) of 35.0 to 35.9 in adult 12/29/2015   Other mechanical complication of implanted electronic neurostimulator of spinal cord electrode (lead), initial encounter (HCC) 08/04/2015   Adjustment disorder with depressed mood 03/29/2015   Hyperlipidemia 07/20/2014   Mononeuritis 07/20/2014   Obesity (BMI 30-39.9) 07/20/2014   Essential hypertension 07/20/2014   Neurocardiogenic syncope 07/06/2014   Normocytic anemia 06/08/2014   POTS (postural orthostatic tachycardia syndrome) 06/08/2014   Hypotension 06/03/2014   Anal fissure 09/16/2013   Adrenal insufficiency (HCC) 05/30/2013   Encounter for long-term (current) use of high-risk medication 05/30/2013   Fall 05/30/2013   Hemorrhage of rectum and anus 05/30/2013   Other long term (current) drug therapy 05/30/2013   Syncope and collapse 05/30/2013   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/30/2013   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/2013   Congenital syphilitic osteochondritis    Gastroesophageal reflux disease without esophagitis 03/07/2012   Other diseases of trachea and bronchus 08/30/2011   Stenosis of  trachea 08/30/2011   Atrophy of nasal turbinates 07/25/2011   Nasal septal perforation 07/25/2011   Cough 03/24/2011   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 [place], [place], [address] Phone: [phone]   Fax:  [phone]  Name: [name] MRN: [id number] Date of Birth: 08/12/1966

## 2021-10-13 ENCOUNTER — Ambulatory Visit (HOSPITAL_COMMUNITY): Payer: BC Managed Care – PPO | Admitting: Physical Therapy

## 2021-11-19 ENCOUNTER — Emergency Department (HOSPITAL_COMMUNITY): Payer: BC Managed Care – PPO

## 2021-11-19 ENCOUNTER — Emergency Department (HOSPITAL_COMMUNITY)
Admission: EM | Admit: 2021-11-19 | Discharge: 2021-11-19 | Disposition: A | Discharge status: Home / Self Care | Payer: BC Managed Care – PPO | Attending: Emergency Medicine | Admitting: Emergency Medicine

## 2021-11-19 ENCOUNTER — Other Ambulatory Visit: Payer: Self-pay

## 2021-11-19 ENCOUNTER — Encounter (HOSPITAL_COMMUNITY): Payer: Self-pay | Admitting: Emergency Medicine

## 2021-11-19 DIAGNOSIS — E119 Type 2 diabetes mellitus without complications: Secondary | ICD-10-CM | POA: Diagnosis not present

## 2021-11-19 DIAGNOSIS — U071 COVID-19: Secondary | ICD-10-CM | POA: Diagnosis not present

## 2021-11-19 DIAGNOSIS — R509 Fever, unspecified: Secondary | ICD-10-CM | POA: Diagnosis present

## 2021-11-19 DIAGNOSIS — N3000 Acute cystitis without hematuria: Secondary | ICD-10-CM | POA: Diagnosis not present

## 2021-11-19 LAB — URINALYSIS, ROUTINE W REFLEX MICROSCOPIC
Bilirubin Urine: NEGATIVE
Glucose, UA: NEGATIVE mg/dL
Hgb urine dipstick: NEGATIVE
Ketones, ur: NEGATIVE mg/dL
Nitrite: POSITIVE — AB
Protein, ur: NEGATIVE mg/dL
Specific Gravity, Urine: <1.005 — ABNORMAL LOW (ref 1.005–1.030)
pH: 6 (ref 5.0–8.0)

## 2021-11-19 LAB — CBC WITH DIFFERENTIAL/PLATELET
Abs Immature Granulocytes: 0.1 10*3/uL — ABNORMAL HIGH (ref 0.00–0.07)
Basophils Absolute: 0 10*3/uL (ref 0.0–0.1)
Basophils Relative: 0 %
Eosinophils Absolute: 0 10*3/uL (ref 0.0–0.5)
Eosinophils Relative: 0 %
HCT: 35.1 % — ABNORMAL LOW (ref 36.0–46.0)
Hemoglobin: 10.4 g/dL — ABNORMAL LOW (ref 12.0–15.0)
Immature Granulocytes: 1 %
Lymphocytes Relative: 21 %
Lymphs Abs: 1.9 10*3/uL (ref 0.7–4.0)
MCH: 25.9 pg — ABNORMAL LOW (ref 26.0–34.0)
MCHC: 29.6 g/dL — ABNORMAL LOW (ref 30.0–36.0)
MCV: 87.5 fL (ref 80.0–100.0)
Monocytes Absolute: 0.4 10*3/uL (ref 0.1–1.0)
Monocytes Relative: 5 %
Neutro Abs: 6.5 10*3/uL (ref 1.7–7.7)
Neutrophils Relative %: 73 %
Platelets: 371 10*3/uL (ref 150–400)
RBC: 4.01 MIL/uL (ref 3.87–5.11)
RDW: 19.2 % — ABNORMAL HIGH (ref 11.5–15.5)
WBC: 9 10*3/uL (ref 4.0–10.5)
nRBC: 0 % (ref 0.0–0.2)

## 2021-11-19 LAB — COMPREHENSIVE METABOLIC PANEL
ALT: 19 U/L (ref 0–44)
AST: 30 U/L (ref 15–41)
Albumin: 3.5 g/dL (ref 3.5–5.0)
Alkaline Phosphatase: 116 U/L (ref 38–126)
Anion gap: 10 (ref 5–15)
BUN: 10 mg/dL (ref 6–20)
CO2: 20 mmol/L — ABNORMAL LOW (ref 22–32)
Calcium: 9.1 mg/dL (ref 8.9–10.3)
Chloride: 106 mmol/L (ref 98–111)
Creatinine, Ser: 1.05 mg/dL — ABNORMAL HIGH (ref 0.44–1.00)
GFR, Estimated: >60 mL/min (ref >60)
Glucose, Bld: 97 mg/dL (ref 70–99)
Potassium: 4.5 mmol/L (ref 3.5–5.1)
Sodium: 136 mmol/L (ref 135–145)
Total Bilirubin: 0.5 mg/dL (ref 0.3–1.2)
Total Protein: 7.4 g/dL (ref 6.5–8.1)

## 2021-11-19 LAB — URINALYSIS, MICROSCOPIC (REFLEX)

## 2021-11-19 LAB — RESP PANEL BY RT-PCR (FLU A&B, COVID) ARPGX2
Influenza A by PCR: NEGATIVE
Influenza B by PCR: NEGATIVE
SARS Coronavirus 2 by RT PCR: POSITIVE — AB

## 2021-11-19 MED ORDER — SODIUM CHLORIDE 0.9 % IV SOLN: INTRAVENOUS | Status: DC

## 2021-11-19 MED ORDER — SODIUM CHLORIDE 0.9 % IV SOLN
1.0000 g | Freq: Once | INTRAVENOUS | Status: AC
  Administered 2021-11-19: 1 g via INTRAVENOUS
  Filled 2021-11-19: qty 10

## 2021-11-19 MED ORDER — CEPHALEXIN 500 MG PO CAPS: 500.0000 mg | ORAL_CAPSULE | Freq: Four times a day (QID) | ORAL | 0 refills | Status: DC

## 2021-11-19 MED ORDER — SODIUM CHLORIDE 0.9 % IV BOLUS
500.0000 mL | Freq: Once | INTRAVENOUS | Status: AC
  Administered 2021-11-19: 500 mL via INTRAVENOUS

## 2021-11-19 MED ORDER — ALBUTEROL SULFATE HFA 108 (90 BASE) MCG/ACT IN AERS
2.0000 | INHALATION_SPRAY | Freq: Four times a day (QID) | RESPIRATORY_TRACT | Status: DC
  Administered 2021-11-19: 2 via RESPIRATORY_TRACT
  Filled 2021-11-19: qty 6.7

## 2021-11-19 NOTE — Discharge Instructions (Addendum)
Heart antibiotic Keflex for the urinary tract infection which seems to be new.  COVID test was positive so most likely her lung findings are related to the COVID.  Return for any significant problems with breathing.  Oxygen saturations here are 98%.  Chest x-ray showed multifocal pneumonia which was suspicious for a viral type pneumonia and since you have been on multiple courses of antibiotics its most likely the cause of the chest x-ray findings that is a virus is the cause.  Urine has been sent for culture.  Take the antibiotic Keflex as directed for that.  Make an appointment follow-up with your doctor.  Return for any new or worse symptoms.  Also use Mucinex DM get it over-the-counter to help with the cough and the congestion.  And use your albuterol inhaler 2 puffs every 6 hours for the next 7 days.

## 2021-11-19 NOTE — ED Provider Notes (Signed)
[facility] Provider Note   CSN: [id number] Arrival date & time: 11/19/21  1435     History  No chief complaint on file.   [name] is a 56 y.o. female.  Patient has not been feeling well since December 24.  But in this past week seem to get worse.  Her doctor started her back on Levaquin antibiotic.  Patient has not had any recent COVID testing.  Has not had any recent chest x-rays.  Patient is currently taking the the Levaquin.  Apparently is been on antibiotics before.  Patient's main complaints is feeling weak dizzy fever cough.  No true room spinning.  Patient states at home she is had a fever to 100 101.  Temp here 99.  Oxygen sats 98% heart rate 80 respiration 17 blood pressure 102/73.   Past medical history significant for Wegener's disease.  Diabetes.  Wegener's granulomatosis dyspnea hypothyroidism chronic cough.  Chronic pain.  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  Also right helix amputation.      Home Medications Prior to Admission medications   Medication Sig Start Date End Date Taking? Authorizing Provider  acetaminophen (TYLENOL) 325 MG tablet Take 2 tablets (650 mg total) by mouth every 6 (six) hours as needed for mild pain (or Fever >/= 101). 07/12/16  Yes Vann, Jessica U, DO  albuterol (PROVENTIL HFA;VENTOLIN HFA) 108 (90 Base) MCG/ACT inhaler Inhale 2 puffs into the lungs every 6 (six) hours as needed for wheezing or shortness of breath.   Yes [provider]  amitriptyline (ELAVIL) 50 MG tablet Take 50 mg by mouth at bedtime. 11/04/21  Yes [provider]  b complex vitamins tablet Take 1 tablet by mouth daily.   Yes [provider]  Calcium Carb-Cholecalciferol (CALCIUM CARBONATE-VITAMIN D3 PO) Take 1 tablet by mouth daily.   Yes [provider]  cephALEXin (KEFLEX) 500 MG capsule Take 1  capsule (500 mg total) by mouth 4 (four) times daily. 11/19/21  Yes Fredia Sorrow, MD  docusate sodium (COLACE) 100 MG capsule Take 1 capsule (100 mg total) by mouth 2 (two) times daily. While taking narcotic pain medicine. 03/25/21  Yes Corky Sing, PA-C  DULoxetine (CYMBALTA) 60 MG capsule Take 60 mg by mouth daily.   Yes [provider]  famotidine (PEPCID) 20 MG tablet Take 20 mg by mouth daily. 01/12/20  Yes Martinique, Peter M, MD  folic acid (FOLVITE) 1 MG tablet Take 2 mg by mouth daily.   Yes [provider]  furosemide (LASIX) 40 MG tablet Take 1 tablet (40 mg total) by mouth as directed. Patient taking differently: Take 40 mg by mouth daily. 09/06/20  Yes Martinique, Peter M, MD  gabapentin (NEURONTIN) 300 MG capsule Take 600-900 mg by mouth See admin instructions. Take 3 capsules (900 mg total) by mouth in the morning, 2 capsule and 4 capsules (1200 mg total) in the evening.   Yes [provider]  levofloxacin (LEVAQUIN) 500 MG tablet Take 500 mg by mouth daily. 11/15/21  Yes [provider]  lidocaine (LIDODERM) 5 % Place 1 patch onto the skin daily as needed (for pain). Remove & Discard patch within 12 hours or as directed by MD   Yes [provider]  linaclotide Rolan Lipa) 145 MCG CAPS capsule  09/21/21  Yes [provider]  magnesium oxide (MAG-OX) 400 MG tablet Take 400 mg by mouth daily.   Yes  [provider]  Melatonin 10 MG TABS Take 1 tablet by mouth at bedtime as needed (sleep).   Yes [provider]  METHOTREXATE SODIUM IJ Inject 25 mg into the skin every Sunday.   Yes [provider]  naloxone (NARCAN) nasal spray 4 mg/0.1 mL Place into the nose. 12/11/19  Yes [provider]  oxyCODONE (OXY IR/ROXICODONE) 5 MG immediate release tablet Take 5 mg by mouth 3 (three) times daily as needed. 04/07/21  Yes [provider]  pantoprazole (PROTONIX) 40 MG tablet Take 40 mg by mouth daily.  07/01/21  Yes [provider]  Potassium Chloride Crys ER (K-DUR PO) Take 40 mEq by mouth every other day.   Yes [provider]  predniSONE (DELTASONE) 20 MG tablet Take 1 tablet (20 mg total) by mouth 2 (two) times daily. Patient taking differently: Take 5 mg by mouth daily. 05/11/21  Yes Daleen Bo, MD  promethazine-dextromethorphan (PROMETHAZINE-DM) 6.25-15 MG/5ML syrup Take 5-10 mLs by mouth at bedtime as needed for cough. 11/07/21  Yes [provider]  propranolol (INDERAL) 40 MG tablet Take 40 mg by mouth daily. 07/21/21  Yes [provider]  rosuvastatin (CRESTOR) 10 MG tablet Take 10 mg by mouth at bedtime. 10/25/21  Yes [provider]  senna (SENOKOT) 8.6 MG TABS tablet Take 2 tablets (17.2 mg total) by mouth 2 (two) times daily. 03/25/21  Yes Corky Sing, PA-C  sulfamethoxazole-trimethoprim (BACTRIM) 400-80 MG tablet Take 1 tablet by mouth 3 (three) times a week. Monday,Wednesday and Friday 07/13/21  Yes [provider]  tizanidine (ZANAFLEX) 2 MG capsule Take 2 mg by mouth at bedtime. 05/10/17  Yes [provider]  topiramate (TOPAMAX) 100 MG tablet Take 100 mg by mouth daily. 01/14/18  Yes [provider]  vitamin B-12 (CYANOCOBALAMIN) 1000 MCG tablet Take 1,000 mcg by mouth daily.   Yes [provider]  Vitamin D, Ergocalciferol, (DRISDOL) 50000 units CAPS capsule Take 50,000 Units by mouth every Friday.   Yes [provider]  amoxicillin-clavulanate (AUGMENTIN) 875-125 MG tablet Take 1 tablet by mouth 2 (two) times daily. Patient not taking: Reported on 07/01/2021 06/01/21   Tommy Medal, Lavell Islam, MD  fluconazole (DIFLUCAN) 200 MG tablet Take 2 tablets (400 mg total) by mouth daily. Patient not taking: Reported on 07/26/2021 06/01/21   Tommy Medal, Lavell Islam, MD  oxyCODONE-acetaminophen (PERCOCET/ROXICET) 5-325 MG tablet Take 1 tablet by mouth every 6 (six) hours as needed for severe pain. Patient not  taking: Reported on 07/26/2021 05/11/21   Daleen Bo, MD  traZODone (DESYREL) 100 MG tablet Take 1 tablet (100 mg total) by mouth at bedtime. Patient not taking: Reported on 11/19/2021 01/12/20   Martinique, Peter M, MD      Allergies    Ibuprofen and Penicillins    Review of Systems   Review of Systems  Constitutional:  Positive for fatigue and fever. Negative for chills.  HENT:  Positive for congestion. Negative for ear pain and sore throat.   Eyes:  Negative for pain and visual disturbance.  Respiratory:  Positive for shortness of breath. Negative for cough.   Cardiovascular:  Negative for chest pain and palpitations.  Gastrointestinal:  Negative for abdominal pain and vomiting.  Genitourinary:  Negative for dysuria and hematuria.  Musculoskeletal:  Negative for arthralgias and back pain.  Skin:  Negative for color change and rash.  Neurological:  Positive for dizziness and weakness. Negative for seizures and syncope.  All other systems reviewed and  are negative.  Physical Exam Updated Vital Signs BP 122/77    Pulse 80    Temp 99.1 F (37.3 C) (Oral)    Resp (!) 24    Ht 1.676 m (5\' 6" )    Wt 84.8 kg    SpO2 97%    BMI 30.18 kg/m  Physical Exam Vitals and nursing note reviewed.  Constitutional:      General: She is not in acute distress.    Appearance: Normal appearance. She is well-developed.  HENT:     Head: Normocephalic and atraumatic.     Mouth/Throat:     Mouth: Mucous membranes are dry.  Eyes:     Extraocular Movements: Extraocular movements intact.     Conjunctiva/sclera: Conjunctivae normal.     Pupils: Pupils are equal, round, and reactive to light.  Cardiovascular:     Rate and Rhythm: Normal rate and regular rhythm.     Heart sounds: No murmur heard. Pulmonary:     Effort: Pulmonary effort is normal. No respiratory distress.     Breath sounds: Normal breath sounds. No wheezing, rhonchi or rales.  Abdominal:     Palpations: Abdomen is soft.     Tenderness:  There is no abdominal tenderness.  Musculoskeletal:        General: No swelling.     Cervical back: Normal range of motion and neck supple.  Skin:    General: Skin is warm and dry.     Capillary Refill: Capillary refill takes less than 2 seconds.  Neurological:     General: No focal deficit present.     Mental Status: She is alert and oriented to person, place, and time.  Psychiatric:        Mood and Affect: Mood normal.    ED Results / Procedures / Treatments   Labs (all labs ordered are listed, but only abnormal results are displayed) Labs Reviewed  RESP PANEL BY RT-PCR (FLU A&B, COVID) ARPGX2 - Abnormal; Notable for the following components:      Result Value   SARS Coronavirus 2 by RT PCR POSITIVE (*)    All other components within normal limits  CBC WITH DIFFERENTIAL/PLATELET - Abnormal; Notable for the following components:   Hemoglobin 10.4 (*)    HCT 35.1 (*)    MCH 25.9 (*)    MCHC 29.6 (*)    RDW 19.2 (*)    Abs Immature Granulocytes 0.10 (*)    All other components within normal limits  COMPREHENSIVE METABOLIC PANEL - Abnormal; Notable for the following components:   CO2 20 (*)    Creatinine, Ser 1.05 (*)    All other components within normal limits  URINALYSIS, ROUTINE W REFLEX MICROSCOPIC - Abnormal; Notable for the following components:   Specific Gravity, Urine <1.005 (*)    Nitrite POSITIVE (*)    Leukocytes,Ua LARGE (*)    All other components within normal limits  URINALYSIS, MICROSCOPIC (REFLEX) - Abnormal; Notable for the following components:   Bacteria, UA FEW (*)    All other components within normal limits  URINE CULTURE    EKG EKG Interpretation  Date/Time:  Saturday November 19 2021 15:42:30 EST Ventricular Rate:  74 PR Interval:  133 QRS Duration: 91 QT Interval:  372 QTC Calculation: 413 R Axis:   16 Text Interpretation: Sinus rhythm Low voltage, precordial leads Abnormal R-wave progression, early transition Borderline repolarization  abnormality No significant change since last tracing Confirmed by Fredia Sorrow [phone]) on 11/19/2021 3:51:56 PM  Radiology  DG Chest Port 1 View  Result Date: 11/19/2021 CLINICAL DATA:  Short of breath and chest congestion for over a month. EXAM: PORTABLE CHEST 1 VIEW COMPARISON:  09/08/2021 and older studies. FINDINGS: Cardiac silhouette is normal in size. No mediastinal or hilar masses. Subtle focal areas of hazy airspace opacity noted in the peripheral left mid to upper lung and in the right mid lung, new from the prior exam. Remainder of the lungs is clear. No convincing pleural effusion or pneumothorax. Skeletal structures are grossly intact. IMPRESSION: 1. Small subtle areas of hazy airspace opacity in the left mid and upper lung and right mid lung suspicious for multifocal pneumonia. Electronically Signed   By: Lajean Manes M.D.   On: 11/19/2021 15:30    Procedures Procedures    Medications Ordered in ED Medications  0.9 %  sodium chloride infusion (0 mLs Intravenous Stopped 11/19/21 2055)  cefTRIAXone (ROCEPHIN) 1 g in sodium chloride 0.9 % 100 mL IVPB (has no administration in time range)  sodium chloride 0.9 % bolus 500 mL (0 mLs Intravenous Stopped 11/19/21 1703)    ED Course/ Medical Decision Making/ A&P                           Medical Decision Making Amount and/or Complexity of Data Reviewed Labs: ordered. Radiology: ordered.  Risk Prescription drug management.   Chest x-ray seems to be consistent with a multifocal pneumonia.  COVID and influenza testing still pending.  Complete metabolic panel normal kidney function GFR greater than 60.  Clinically patient appears dehydrated with her tongue being very dry.  No leukocytosis.  Hemoglobin 10.4.  Labs overall reassuring.  As suspected COVID test is positive.  Most likely the cause of the multifocal pneumonia.  Would explain why multiple courses of antibiotics and currently the Levaquin treatment is not making her feel any  better.  Urinalysis does raise concerns for urinary tract infection.  There is 21-50 white cells nitrite positive.  We will send urine for culture.  We will switch antibiotic to Keflex for the UTI and then further antibiotic treatment will be based on the urine culture.  Patient will follow up with primary care provider.   Final Clinical Impression(s) / ED Diagnoses Final diagnoses:  COVID  Acute cystitis without hematuria    Rx / DC Orders ED Discharge Orders          Ordered    cephALEXin (KEFLEX) 500 MG capsule  4 times daily        11/19/21 2114              Fredia Sorrow, MD 11/19/21 2116

## 2021-11-19 NOTE — ED Triage Notes (Signed)
Pt c/o uri x 1 month. States she has been weak and dizzy today. Pt has chronic cough.Fever 100-101 x 1week. Denies covid exposure. On 3rd round of abx. Hx of wegener's and low potassium

## 2021-11-21 LAB — URINE CULTURE: Culture: <10000 — AB

## 2021-11-26 NOTE — Progress Notes (Signed)
Cardiology Office Note   Date:  11/28/2021   ID:  [name], DOB [birth year], MRN [id number]  PCP:  Chesley Noon, MD  Cardiologist:   Devere Brem Martinique, MD   Chief Complaint  Patient presents with   Near Syncope      History of Present Illness: [name] is a 56 y.o. female who presents for follow up POTs. Last seen by me in November 2021. She has a history of autonomic dysfunction/POTS/neurocardiogenic syncope, diabetes mellitus, obstructive sleep apnea unable to tolerate CPAP or BiPAP mask due to her granulomatosis, Wegener's granulomatosis, neuropathy with chronic pain, and CKD stage III.   Patient has history of hypotension with many years with symptoms of dizziness, fatigue, lethargy, and inability to focus. She has had multiple falls with injuries. Prior cardiac work-up includes a normal Echo at [facility] in 2012. She also had a Tilt Table Test in 07/2013 and has been treated for neurocardiogenic syncope with chronic Florinef. In 2015, she had a normal stress Echo but with poor functional status at [facility]. Symptoms persisted despite Florinef; therefore, patient was also placed on Midodrine. She was seen by Dr. Lovena Le in 01/2018 and it was felt like she had classic neurocardiogenic syncope symptoms with autonomic dysfunction. Continued medical and lifestyle modification was recommended. Symptoms persisted on both Florinef and Midodrine so she was seen by our Pharm D in 03/2018 and started on Northera. This was briefly held after patient developed severe leg fatigue and weakness. It was later felt that this was due to a flair of her Wegener's disease and Northera was able to be resumed.  Symptoms did improve significantly after starting Northera.    She was admitted in 12/2018 with respiratory failure which was felt to be due to granulomatosis/polyangitis. She was treated with steroids. Also started on antibiotics due to fever and extensive skin lesions. Echo during this admission  showed LVEF of 65-70% with normal wall motion and diastolic function.   Patient was seen in 03/2019 at which time patient reported she was not able to take Northera due to cost. It was going to cost her $1600/month.  Sometime prior to this visit it looks like her Midodrine was stopped.    She was admitted from 09/10/2019 to 09/15/2019 for bilateral lower toe infection and significant hypokalemia. Following discharge, patient was seen by Dr. 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 He was concerned that Florinef continues to put her at risk for hypokalemia even after lowering the dose. Therefore, Florinef was stopped.     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    She does have recurrent severe hypokalemia.   She was admitted in October 2021  for  toe ulcers and osteomyelitis. Had multiple toe amputations. In December 2021 she had left transmetatarsal amputations and right hallux amputation.    Patient has known sleep apnea but has not been able to tolerate CPAP or BiPAP masks.  She was admitted in Jan 2022 with Covid and persistent diarrhea.   In June 2022 she had recurrent osteomyelitis of her feet and had Right foot transmetatarsal amputation,  Left 5th MT ray amputation. LE arterial dopplers were normal in Dec 2022.   On follow up today she is doing well from a cardiac standpoint. She is on no therapy for orthostasis/POTS. She has had 2 episode of near syncope but did not pass out. Does have warning.  Is going to see Neurology for assessment of her sleep apnea. She does complain of severe neuropathic pain in her feet. Based on lab work in Dec she is now on Crestor. (Of note CT in 2017 showed no coronary or aortic calcification).    Past Medical History:  Diagnosis Date   Chronic cough    Chronic pain    Diabetes mellitus  without complication (HCC)    Dyspnea    GERD (gastroesophageal reflux disease)    Hypokalemia    Hypothyroidism    Left wrist pain 06/01/2021   Myonecrosis ([place]) 07/01/2021   Neurocardiogenic syncope    OSA (obstructive sleep apnea)    does not use CPAP   Osteomyelitis (HCC)    bilateral feet   Peripheral neuropathy    Presence of intrathecal pump    recieves Prialt/bupivicaine   Tear of gluteus medius tendon 07/01/2021   Wegener's granulomatosis 2009   Wegner's disease (congenital syphilitic osteochondritis)     Past Surgical History:  Procedure Laterality Date   AMPUTATION Right 10/21/2020   Procedure: Right hallux amputation;  Surgeon: Wylene Simmer, MD;  Location: [place];  Service: Orthopedics;  Laterality: Right;   AMPUTATION TOE Bilateral 08/12/2020   Procedure: Right 3rd toe amputation; left hallux and 3rd toe amputation;  Surgeon: Wylene Simmer, MD;  Location: [place];  Service: Orthopedics;  Laterality: Bilateral;  78min   AMPUTATION TOE Right 03/24/2021   Procedure: AMPUTATION TOE;  Surgeon: Wylene Simmer, MD;  Location: [place];  Service: Orthopedics;  Laterality: Right;   APPENDECTOMY     BACK SURGERY     CHOLECYSTECTOMY     COLONOSCOPY N/A 06/02/2013   Procedure: COLONOSCOPY;  Surgeon: Wonda Horner, MD;  Location: [facility] ENDOSCOPY;  Service: Endoscopy;  Laterality: N/A;   HERNIA REPAIR  2000   INCISION AND DRAINAGE ABSCESS Left 07/07/2016   Procedure: DEBRIDMENT LEFT THIGH ABSCESS, EXISION ACUTE SKIN RASH LEFT THIGH(1CM LESION);  Surgeon: Fanny Skates, MD;  Location: [facility];  Service: General;  Laterality: Left;   SEPTOPLASTY  02/2013   spleen anuyism     splenic aneurysm     TRANSMETATARSAL AMPUTATION Bilateral 10/21/2020   Procedure: Left transmetatarsal amputation;  Surgeon: Wylene Simmer, MD;  Location: [place];  Service: Orthopedics;  Laterality: Bilateral;   TRANSMETATARSAL AMPUTATION Left 03/24/2021   Procedure:  TRANSMETATARSAL AMPUTATION;  Surgeon: Wylene Simmer, MD;  Location: [place];  Service: Orthopedics;  Laterality: Left;   VAGINAL HYSTERECTOMY       Current Outpatient Medications  Medication Sig Dispense Refill   acetaminophen (TYLENOL) 325 MG tablet Take 2 tablets (650 mg total) by mouth every 6 (six) hours as needed for mild pain (or Fever >/= 101).     albuterol (PROVENTIL HFA;VENTOLIN HFA) 108 (90 Base) MCG/ACT inhaler Inhale 2 puffs into the lungs every 6 (six) hours as needed for wheezing or shortness of breath.     amitriptyline (ELAVIL) 50 MG tablet Take 50 mg by mouth at bedtime.     amoxicillin-clavulanate (AUGMENTIN) 875-125 MG tablet Take 1 tablet by mouth 2 (two) times daily. 60 tablet 2   b complex vitamins tablet Take 1 tablet by mouth daily.     Calcium Carb-Cholecalciferol (CALCIUM CARBONATE-VITAMIN D3 PO) Take 1 tablet by mouth daily.     cephALEXin (KEFLEX) 500 MG capsule Take 1 capsule (500 mg total) by mouth 4 (four) times daily. 28 capsule 0   docusate sodium (COLACE) 100 MG capsule Take 1  capsule (100 mg total) by mouth 2 (two) times daily. While taking narcotic pain medicine. 30 capsule 0   DULoxetine (CYMBALTA) 60 MG capsule Take 60 mg by mouth daily.     famotidine (PEPCID) 20 MG tablet Take 20 mg by mouth daily.     fluconazole (DIFLUCAN) 200 MG tablet Take 2 tablets (400 mg total) by mouth daily. 60 tablet 2   folic acid (FOLVITE) 1 MG tablet Take 2 mg by mouth daily.     furosemide (LASIX) 40 MG tablet Take 1 tablet (40 mg total) by mouth as directed. (Patient taking differently: Take 40 mg by mouth daily.) 90 tablet 3   gabapentin (NEURONTIN) 300 MG capsule Take 600-900 mg by mouth See admin instructions. Take 3 capsules (900 mg total) by mouth in the morning, 2 capsule and 4 capsules (1200 mg total) in the evening.     levofloxacin (LEVAQUIN) 500 MG tablet Take 500 mg by mouth daily.     lidocaine (LIDODERM) 5 % Place 1 patch onto the skin daily as needed (for pain).  Remove & Discard patch within 12 hours or as directed by MD     linaclotide (LINZESS) 145 MCG CAPS capsule      magnesium oxide (MAG-OX) 400 MG tablet Take 400 mg by mouth daily.     Melatonin 10 MG TABS Take 1 tablet by mouth at bedtime as needed (sleep).     METHOTREXATE SODIUM IJ Inject 25 mg into the skin every Sunday.     naloxone (NARCAN) nasal spray 4 mg/0.1 mL Place into the nose.     oxyCODONE (OXY IR/ROXICODONE) 5 MG immediate release tablet Take 5 mg by mouth 3 (three) times daily as needed.     oxyCODONE-acetaminophen (PERCOCET/ROXICET) 5-325 MG tablet Take 1 tablet by mouth every 6 (six) hours as needed for severe pain. 10 tablet 0   pantoprazole (PROTONIX) 40 MG tablet Take 40 mg by mouth daily.     Potassium Chloride Crys ER (K-DUR PO) Take 40 mEq by mouth every other day.     predniSONE (DELTASONE) 20 MG tablet Take 1 tablet (20 mg total) by mouth 2 (two) times daily. (Patient taking differently: Take 5 mg by mouth daily.) 10 tablet 0   promethazine-dextromethorphan (PROMETHAZINE-DM) 6.25-15 MG/5ML syrup Take 5-10 mLs by mouth at bedtime as needed for cough.     propranolol (INDERAL) 40 MG tablet Take 40 mg by mouth daily.     rosuvastatin (CRESTOR) 10 MG tablet Take 10 mg by mouth at bedtime.     senna (SENOKOT) 8.6 MG TABS tablet Take 2 tablets (17.2 mg total) by mouth 2 (two) times daily. 30 tablet 0   sulfamethoxazole-trimethoprim (BACTRIM) 400-80 MG tablet Take 1 tablet by mouth 3 (three) times a week. Monday,Wednesday and Friday     tizanidine (ZANAFLEX) 2 MG capsule Take 2 mg by mouth at bedtime.     topiramate (TOPAMAX) 100 MG tablet Take 100 mg by mouth daily.     traZODone (DESYREL) 100 MG tablet Take 100 mg by mouth at bedtime.     vitamin B-12 (CYANOCOBALAMIN) 1000 MCG tablet Take 1,000 mcg by mouth daily.     Vitamin D, Ergocalciferol, (DRISDOL) 50000 units CAPS capsule Take 50,000 Units by mouth every Friday.     No current facility-administered medications for  this visit.    Allergies:   Ibuprofen and Penicillins    Social History:  The patient  reports that she has never smoked. She has never used  smokeless tobacco. She reports current alcohol use. She reports that she does not use drugs.   Family History:  The patient's family history includes Diabetes in her father and mother; Heart disease in her mother.    ROS:  Please see the history of present illness.   Otherwise, review of systems are positive for pain in great toe. Some intermittent leg swelling.   All other systems are reviewed and negative.    PHYSICAL EXAM: VS:  BP 106/74    Pulse 80    Ht 5\' 6"  (1.676 m)    Wt 190 lb 9.6 oz (86.5 kg)    SpO2 96%    BMI 30.76 kg/m  , BMI Body mass index is 30.76 kg/m. GEN: Well nourished, well developed, in no acute distress  HEENT: nasal deformation Neck: no JVD, carotid bruits, or masses Cardiac: RRR; soft 1/6 systolic murmur at the apex, rubs, or gallops,no edema  Respiratory:  clear to auscultation bilaterally, normal work of breathing GI: soft, nontender, nondistended, + BS MS: no deformity or atrophy  Skin: warm and dry, no rash Neuro:  Strength and sensation are intact Psych: euthymic mood, full affect   EKG:  EKG is not ordered today.     Recent Labs: 03/28/2021: B Natriuretic Peptide 98.5 03/29/2021: Magnesium 2.2 11/19/2021: ALT 19; BUN 10; Creatinine, Ser 1.05; Hemoglobin 10.4; Platelets 371; Potassium 4.5; Sodium 136    Lipid Panel No results found for: CHOL, TRIG, HDL, CHOLHDL, VLDL, LDLCALC, LDLDIRECT    Wt Readings from Last 3 Encounters:  11/28/21 190 lb 9.6 oz (86.5 kg)  11/19/21 187 lb (84.8 kg)  07/26/21 190 lb (86.2 kg)      Other studies Reviewed: Additional studies/ records that were reviewed today include: none   ASSESSMENT AND PLAN:  1.  Orthostatic hypotension/POTS. Clinically doing very well. Off Florinef and midodrine. Infrequent episodes of near syncope. She does have warning.  Northera worked  well but she couldn't afford. Fortunately she is doing well.  2. Wegener's granulomatosis on chronic steroids and MTX  3. OSA intolerant to CPAP mask. Follow up with Neuro  4. Obesity.   5. Osteomyelitis of toes s/p multiple ampuations  6. HLD now on Crestor. Follow up labs with PCP   Current medicines are reviewed at length with the patient today.  The patient does not have concerns regarding medicines.  The following changes have been made:  no change  Labs/ tests ordered today include:  No orders of the defined types were placed in this encounter.    Disposition:   FU with me in 1 year  Signed, Earnie Rockhold Martinique, MD  11/28/2021 4:21 PM    [facility] [address], [place], [place], [address] Phone [phone], Fax [phone]

## 2021-11-28 ENCOUNTER — Other Ambulatory Visit: Payer: Self-pay

## 2021-11-28 ENCOUNTER — Encounter: Payer: Self-pay | Admitting: Cardiology

## 2021-11-28 ENCOUNTER — Ambulatory Visit: Payer: BC Managed Care – PPO | Admitting: Cardiology

## 2021-11-28 VITALS — BP 106/74 | HR 80 | Ht 66.0 in | Wt 190.6 lb

## 2021-11-28 DIAGNOSIS — G90A Postural orthostatic tachycardia syndrome (POTS): Secondary | ICD-10-CM | POA: Diagnosis not present

## 2021-11-28 DIAGNOSIS — G4733 Obstructive sleep apnea (adult) (pediatric): Secondary | ICD-10-CM

## 2021-11-28 DIAGNOSIS — E782 Mixed hyperlipidemia: Secondary | ICD-10-CM | POA: Diagnosis not present

## 2021-12-09 ENCOUNTER — Other Ambulatory Visit (HOSPITAL_COMMUNITY): Payer: Self-pay | Admitting: Internal Medicine

## 2021-12-09 ENCOUNTER — Other Ambulatory Visit: Payer: Self-pay | Admitting: Internal Medicine

## 2021-12-09 DIAGNOSIS — R059 Cough, unspecified: Secondary | ICD-10-CM

## 2021-12-20 ENCOUNTER — Other Ambulatory Visit: Payer: Self-pay

## 2021-12-20 ENCOUNTER — Ambulatory Visit (HOSPITAL_COMMUNITY)
Admission: RE | Admit: 2021-12-20 | Discharge: 2021-12-20 | Disposition: A | Discharge status: Home / Self Care | Payer: BC Managed Care – PPO | Source: Ambulatory Visit | Attending: Internal Medicine | Admitting: Internal Medicine

## 2021-12-20 DIAGNOSIS — R059 Cough, unspecified: Secondary | ICD-10-CM | POA: Diagnosis present

## 2021-12-25 IMAGING — DX DG TOE 3RD 2+V*R*
3 series · 3 of 3 positions shown · non-contrast
Comparison: None.

CLINICAL DATA: Right third toe pain after injury 2 weeks ago.

EXAM:
RIGHT THIRD TOE

[toe ap]
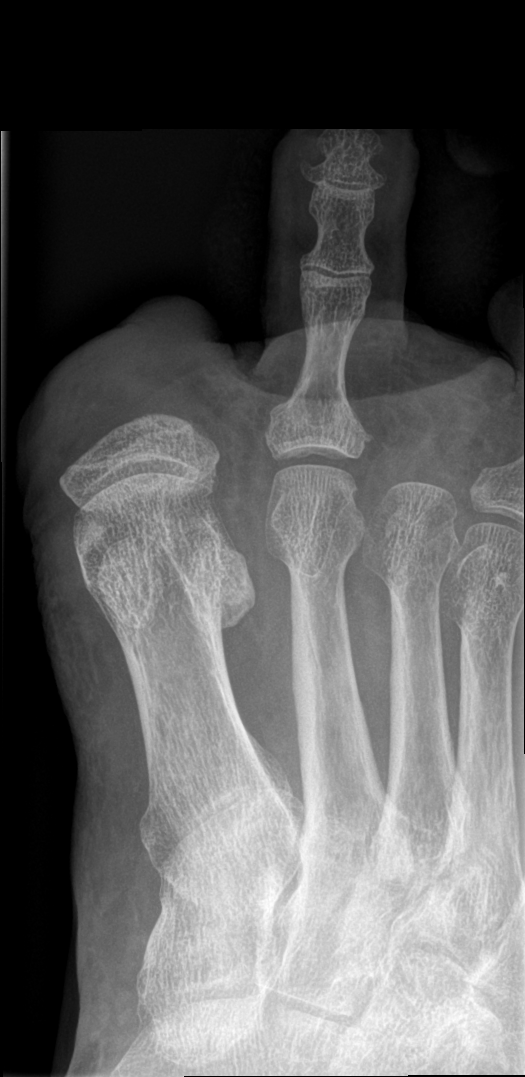

[toe obl]
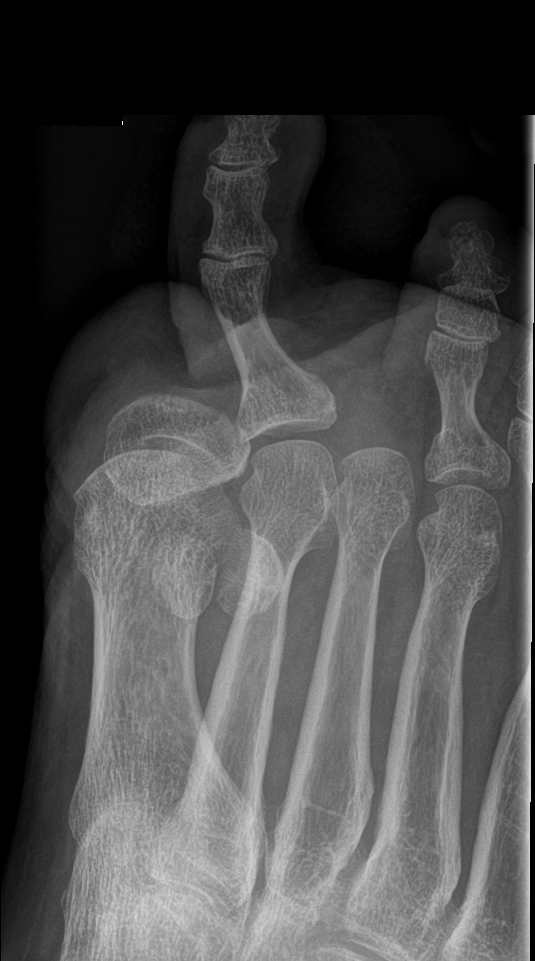

[toe lat]
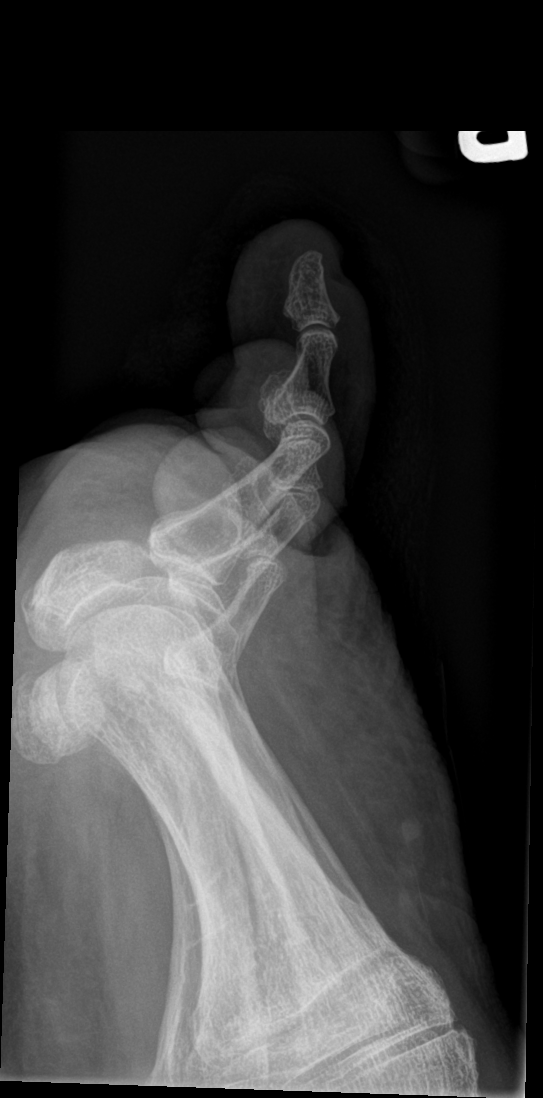

[3 of 3 positions shown; findings below may reference images not displayed]

FINDINGS: Most of the first toe and all of the third toe have been surgically
amputated. No fracture or dislocation is seen involving the second
toe. Remaining joint spaces are intact.
IMPRESSION: Status post surgical amputation of the first and third toes. No
acute abnormality is seen involving the second toe.

## 2022-01-04 ENCOUNTER — Emergency Department (HOSPITAL_COMMUNITY): Payer: BC Managed Care – PPO

## 2022-01-04 ENCOUNTER — Other Ambulatory Visit: Payer: Self-pay

## 2022-01-04 ENCOUNTER — Inpatient Hospital Stay (HOSPITAL_COMMUNITY)
Admission: EM | Admit: 2022-01-04 | Discharge: 2022-01-08 | DRG: 689 | Disposition: A | Discharge status: Home Health | Payer: BC Managed Care – PPO | Attending: Internal Medicine | Admitting: Internal Medicine

## 2022-01-04 ENCOUNTER — Encounter (HOSPITAL_COMMUNITY): Payer: Self-pay | Admitting: Pharmacy Technician

## 2022-01-04 ENCOUNTER — Observation Stay (HOSPITAL_COMMUNITY): Payer: BC Managed Care – PPO

## 2022-01-04 DIAGNOSIS — R52 Pain, unspecified: Secondary | ICD-10-CM

## 2022-01-04 DIAGNOSIS — R0602 Shortness of breath: Secondary | ICD-10-CM

## 2022-01-04 DIAGNOSIS — I10 Essential (primary) hypertension: Secondary | ICD-10-CM | POA: Diagnosis present

## 2022-01-04 DIAGNOSIS — Z978 Presence of other specified devices: Secondary | ICD-10-CM | POA: Diagnosis not present

## 2022-01-04 DIAGNOSIS — J189 Pneumonia, unspecified organism: Secondary | ICD-10-CM | POA: Diagnosis present

## 2022-01-04 DIAGNOSIS — R4182 Altered mental status, unspecified: Secondary | ICD-10-CM | POA: Diagnosis not present

## 2022-01-04 DIAGNOSIS — E1140 Type 2 diabetes mellitus with diabetic neuropathy, unspecified: Secondary | ICD-10-CM | POA: Diagnosis present

## 2022-01-04 DIAGNOSIS — I129 Hypertensive chronic kidney disease with stage 1 through stage 4 chronic kidney disease, or unspecified chronic kidney disease: Secondary | ICD-10-CM | POA: Diagnosis present

## 2022-01-04 DIAGNOSIS — G8929 Other chronic pain: Secondary | ICD-10-CM | POA: Diagnosis present

## 2022-01-04 DIAGNOSIS — M313 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: Secondary | ICD-10-CM | POA: Diagnosis not present

## 2022-01-04 DIAGNOSIS — Z79899 Other long term (current) drug therapy: Secondary | ICD-10-CM

## 2022-01-04 DIAGNOSIS — E039 Hypothyroidism, unspecified: Secondary | ICD-10-CM | POA: Diagnosis present

## 2022-01-04 DIAGNOSIS — Z7952 Long term (current) use of systemic steroids: Secondary | ICD-10-CM

## 2022-01-04 DIAGNOSIS — B9620 Unspecified Escherichia coli [E. coli] as the cause of diseases classified elsewhere: Secondary | ICD-10-CM | POA: Diagnosis present

## 2022-01-04 DIAGNOSIS — Z8249 Family history of ischemic heart disease and other diseases of the circulatory system: Secondary | ICD-10-CM

## 2022-01-04 DIAGNOSIS — Z6832 Body mass index (BMI) 32.0-32.9, adult: Secondary | ICD-10-CM

## 2022-01-04 DIAGNOSIS — J181 Lobar pneumonia, unspecified organism: Secondary | ICD-10-CM

## 2022-01-04 DIAGNOSIS — R059 Cough, unspecified: Secondary | ICD-10-CM

## 2022-01-04 DIAGNOSIS — E2749 Other adrenocortical insufficiency: Secondary | ICD-10-CM | POA: Diagnosis present

## 2022-01-04 DIAGNOSIS — K219 Gastro-esophageal reflux disease without esophagitis: Secondary | ICD-10-CM | POA: Diagnosis present

## 2022-01-04 DIAGNOSIS — G620 Drug-induced polyneuropathy: Secondary | ICD-10-CM | POA: Diagnosis present

## 2022-01-04 DIAGNOSIS — N1831 Chronic kidney disease, stage 3a: Secondary | ICD-10-CM | POA: Diagnosis present

## 2022-01-04 DIAGNOSIS — Z833 Family history of diabetes mellitus: Secondary | ICD-10-CM

## 2022-01-04 DIAGNOSIS — R443 Hallucinations, unspecified: Secondary | ICD-10-CM | POA: Diagnosis present

## 2022-01-04 DIAGNOSIS — N3000 Acute cystitis without hematuria: Secondary | ICD-10-CM | POA: Diagnosis not present

## 2022-01-04 DIAGNOSIS — M67912 Unspecified disorder of synovium and tendon, left shoulder: Secondary | ICD-10-CM

## 2022-01-04 DIAGNOSIS — N390 Urinary tract infection, site not specified: Secondary | ICD-10-CM

## 2022-01-04 DIAGNOSIS — G4733 Obstructive sleep apnea (adult) (pediatric): Secondary | ICD-10-CM | POA: Diagnosis present

## 2022-01-04 DIAGNOSIS — T402X5A Adverse effect of other opioids, initial encounter: Secondary | ICD-10-CM | POA: Diagnosis present

## 2022-01-04 DIAGNOSIS — E669 Obesity, unspecified: Secondary | ICD-10-CM | POA: Diagnosis present

## 2022-01-04 DIAGNOSIS — Z20822 Contact with and (suspected) exposure to covid-19: Secondary | ICD-10-CM | POA: Diagnosis present

## 2022-01-04 DIAGNOSIS — E1122 Type 2 diabetes mellitus with diabetic chronic kidney disease: Secondary | ICD-10-CM | POA: Diagnosis present

## 2022-01-04 DIAGNOSIS — D6489 Other specified anemias: Secondary | ICD-10-CM | POA: Diagnosis present

## 2022-01-04 DIAGNOSIS — R509 Fever, unspecified: Principal | ICD-10-CM

## 2022-01-04 DIAGNOSIS — T451X5A Adverse effect of antineoplastic and immunosuppressive drugs, initial encounter: Secondary | ICD-10-CM | POA: Diagnosis present

## 2022-01-04 DIAGNOSIS — J3489 Other specified disorders of nose and nasal sinuses: Secondary | ICD-10-CM | POA: Diagnosis present

## 2022-01-04 LAB — CBC WITH DIFFERENTIAL/PLATELET
Abs Immature Granulocytes: 0.13 10*3/uL — ABNORMAL HIGH (ref 0.00–0.07)
Basophils Absolute: 0.1 10*3/uL (ref 0.0–0.1)
Basophils Relative: 0 %
Eosinophils Absolute: 0.3 10*3/uL (ref 0.0–0.5)
Eosinophils Relative: 2 %
HCT: 35 % — ABNORMAL LOW (ref 36.0–46.0)
Hemoglobin: 10.7 g/dL — ABNORMAL LOW (ref 12.0–15.0)
Immature Granulocytes: 1 %
Lymphocytes Relative: 33 %
Lymphs Abs: 6.4 10*3/uL — ABNORMAL HIGH (ref 0.7–4.0)
MCH: 26.2 pg (ref 26.0–34.0)
MCHC: 30.6 g/dL (ref 30.0–36.0)
MCV: 85.8 fL (ref 80.0–100.0)
Monocytes Absolute: 0.7 10*3/uL (ref 0.1–1.0)
Monocytes Relative: 4 %
Neutro Abs: 11.9 10*3/uL — ABNORMAL HIGH (ref 1.7–7.7)
Neutrophils Relative %: 60 %
Platelets: 380 10*3/uL (ref 150–400)
RBC: 4.08 MIL/uL (ref 3.87–5.11)
RDW: 17.8 % — ABNORMAL HIGH (ref 11.5–15.5)
WBC: 19.6 10*3/uL — ABNORMAL HIGH (ref 4.0–10.5)
nRBC: 0.2 % (ref 0.0–0.2)

## 2022-01-04 LAB — URINALYSIS, ROUTINE W REFLEX MICROSCOPIC
Bilirubin Urine: NEGATIVE
Glucose, UA: NEGATIVE mg/dL
Hgb urine dipstick: NEGATIVE
Ketones, ur: NEGATIVE mg/dL
Nitrite: POSITIVE — AB
Protein, ur: NEGATIVE mg/dL
Specific Gravity, Urine: 1.015 (ref 1.005–1.030)
WBC, UA: >50 WBC/hpf — ABNORMAL HIGH (ref 0–5)
pH: 6 (ref 5.0–8.0)

## 2022-01-04 LAB — BRAIN NATRIURETIC PEPTIDE: B Natriuretic Peptide: 124.9 pg/mL — ABNORMAL HIGH (ref 0.0–100.0)

## 2022-01-04 LAB — COMPREHENSIVE METABOLIC PANEL
ALT: 27 U/L (ref 0–44)
AST: 28 U/L (ref 15–41)
Albumin: 2.9 g/dL — ABNORMAL LOW (ref 3.5–5.0)
Alkaline Phosphatase: 104 U/L (ref 38–126)
Anion gap: 12 (ref 5–15)
BUN: 14 mg/dL (ref 6–20)
CO2: 18 mmol/L — ABNORMAL LOW (ref 22–32)
Calcium: 8.6 mg/dL — ABNORMAL LOW (ref 8.9–10.3)
Chloride: 106 mmol/L (ref 98–111)
Creatinine, Ser: 1.1 mg/dL — ABNORMAL HIGH (ref 0.44–1.00)
GFR, Estimated: 59 mL/min — ABNORMAL LOW (ref >60)
Glucose, Bld: 80 mg/dL (ref 70–99)
Potassium: 4.1 mmol/L (ref 3.5–5.1)
Sodium: 136 mmol/L (ref 135–145)
Total Bilirubin: 0.4 mg/dL (ref 0.3–1.2)
Total Protein: 5.9 g/dL — ABNORMAL LOW (ref 6.5–8.1)

## 2022-01-04 LAB — BLOOD GAS, VENOUS
Acid-base deficit: 3.3 mmol/L — ABNORMAL HIGH (ref 0.0–2.0)
Bicarbonate: 21.2 mmol/L (ref 20.0–28.0)
Drawn by: 46089
O2 Saturation: 99.1 %
Patient temperature: 39
pCO2, Ven: 38 mmHg — ABNORMAL LOW (ref 44–60)
pH, Ven: 7.36 (ref 7.25–7.43)
pO2, Ven: 154 mmHg — ABNORMAL HIGH (ref 32–45)

## 2022-01-04 LAB — RESP PANEL BY RT-PCR (FLU A&B, COVID) ARPGX2
Influenza A by PCR: NEGATIVE
Influenza B by PCR: NEGATIVE
SARS Coronavirus 2 by RT PCR: NEGATIVE

## 2022-01-04 LAB — MRSA NEXT GEN BY PCR, NASAL: MRSA by PCR Next Gen: DETECTED — AB

## 2022-01-04 LAB — LACTIC ACID, PLASMA: Lactic Acid, Venous: 1.2 mmol/L (ref 0.5–1.9)

## 2022-01-04 MED ORDER — SODIUM CHLORIDE 0.9 % IV SOLN: 1.0000 g | INTRAVENOUS | Status: DC

## 2022-01-04 MED ORDER — VANCOMYCIN HCL 1500 MG/300ML IV SOLN
1500.0000 mg | Freq: Once | INTRAVENOUS | Status: AC
  Administered 2022-01-05: 1500 mg via INTRAVENOUS
  Filled 2022-01-04: qty 300

## 2022-01-04 MED ORDER — TAPENTADOL HCL 50 MG PO TABS
150.0000 mg | ORAL_TABLET | Freq: Once | ORAL | Status: DC
Start: 2022-01-04 — End: 2022-01-08
  Filled 2022-01-04: qty 2

## 2022-01-04 MED ORDER — SODIUM CHLORIDE 0.9 % IV SOLN
2.0000 g | Freq: Three times a day (TID) | INTRAVENOUS | Status: DC
  Administered 2022-01-04 – 2022-01-07 (×9): 2 g via INTRAVENOUS
  Filled 2022-01-04 (×8): qty 2

## 2022-01-04 MED ORDER — AZITHROMYCIN 500 MG PO TABS
500.0000 mg | ORAL_TABLET | Freq: Every day | ORAL | Status: DC
  Administered 2022-01-05 – 2022-01-07 (×3): 500 mg via ORAL
  Filled 2022-01-04 (×3): qty 1

## 2022-01-04 MED ORDER — SODIUM CHLORIDE 0.9 % IV SOLN: 2.0000 g | Freq: Three times a day (TID) | INTRAVENOUS | Status: DC

## 2022-01-04 MED ORDER — VANCOMYCIN HCL 750 MG/150ML IV SOLN: 750.0000 mg | Freq: Two times a day (BID) | INTRAVENOUS | Status: DC

## 2022-01-04 MED ORDER — GABAPENTIN 300 MG PO CAPS
1200.0000 mg | ORAL_CAPSULE | Freq: Once | ORAL | Status: AC
  Administered 2022-01-04: 1200 mg via ORAL
  Filled 2022-01-04: qty 4

## 2022-01-04 MED ORDER — VANCOMYCIN HCL 1500 MG/300ML IV SOLN
1500.0000 mg | Freq: Once | INTRAVENOUS | Status: DC
  Filled 2022-01-04: qty 300

## 2022-01-04 MED ORDER — VANCOMYCIN HCL 750 MG/150ML IV SOLN
750.0000 mg | Freq: Two times a day (BID) | INTRAVENOUS | Status: DC
  Administered 2022-01-05 – 2022-01-07 (×5): 750 mg via INTRAVENOUS
  Filled 2022-01-04 (×6): qty 150

## 2022-01-04 MED ORDER — ACETAMINOPHEN 500 MG PO TABS
1000.0000 mg | ORAL_TABLET | Freq: Once | ORAL | Status: AC
  Administered 2022-01-04: 1000 mg via ORAL
  Filled 2022-01-04: qty 2

## 2022-01-04 NOTE — Progress Notes (Signed)
Pharmacy Antibiotic Note ? ?[name] is a 56 y.o. female admitted on 01/04/2022 presenting with SOB, cough, concern for pna.  Pharmacy has been consulted for vancomycin and cefepime dosing. ? ?Plan: ?Vancomycin 1500 mg IV x 1, then 750 mg IV q 12h (eAUC 508, Goal AUC 400-550, SCr 1.1) ?Cefepime 2g IV q 8h ?Add MRSA PCR ?Monitor renal function, Cx/PCR to narrow ?Vancomycin levels as needed ? ?  ? ?Temp (24hrs), Avg:102.9 ?F (39.4 ?C), Min:102.9 ?F (39.4 ?C), Max:102.9 ?F (39.4 ?C) ? ?Recent Labs  ?Lab 01/04/22 ?1708  ?WBC 19.6*  ?CREATININE 1.10*  ?  ?CrCl cannot be calculated (Unknown ideal weight.).   ? ?Allergies  ?Allergen Reactions  ? Ibuprofen Other (See Comments)  ?  Pt is unable to take this due to kidney problems.   ?  ? Penicillins Rash and Other (See Comments)  ?  Has patient had a PCN reaction causing immediate rash, facial/tongue/throat swelling, SOB or lightheadedness with hypotension: No ?Has patient had a PCN reaction causing severe rash involving mucus membranes or skin necrosis: No ?Has patient had a PCN reaction that required hospitalization No ?Has patient had a PCN reaction occurring within the last 10 years: No ?If all of the above answers are "NO", then may proceed with Cephalosporin use.  ? ? ?Bertis Ruddy, PharmD ?Clinical Pharmacist ?ED Pharmacist Phone # [phone] ?01/04/2022 7:06 PM ? ? ?

## 2022-01-04 NOTE — Assessment & Plan Note (Addendum)
Continue to monitor BMP. ?

## 2022-01-04 NOTE — Assessment & Plan Note (Addendum)
Continue Protonix °

## 2022-01-04 NOTE — Assessment & Plan Note (Signed)
Chronic. 

## 2022-01-04 NOTE — Assessment & Plan Note (Addendum)
Continue propanolol 

## 2022-01-04 NOTE — Progress Notes (Signed)
? ?  CT chest shows RUL infiltrate. Pt not coughing up any blood. Add zithromax. ? ?Kristopher Oppenheim, DO ?[facility] ? ?

## 2022-01-04 NOTE — Assessment & Plan Note (Signed)
Chronic.  Patient on Nucynta, gabapentin, Cymbalta, Zanaflex, Elavil, Topamax, trazodone. ?

## 2022-01-04 NOTE — Assessment & Plan Note (Addendum)
Intrathecal pump dose was increased by 15% on the recent clinical visit in February 2023.  OxyContin was discontinued and patient was placed her back on Nucynta.  Now with increased lethargy and hallucination. Will decrease Nucynta dose to 100 mg at night..  Patient to follow-up with [facility] pain clinic. ?

## 2022-01-04 NOTE — ED Triage Notes (Signed)
Pt here with reports of shob and coughing up a lot of phlegm for several weeks. Pt family member states she is hallucinating intermittently. Pt due for bronchoscopy on Monday. Pt also with decreased movement to L upper arm "for some time". Pt febrile in triage. Pt also complaining of some back pain intermittently.  ?

## 2022-01-04 NOTE — ED Provider Triage Note (Addendum)
Emergency Medicine Provider Triage Evaluation Note ? ?[name] , a 56 y.o. female  was evaluated in triage.  Pt complains of shortness of breath that is been ongoing since Christmas.  According to significant other at the bedside, he reports patient has had worsening shortness of breath in the last few months.  She also was COVID-positive last month, admitted for this at [facility].  She was scheduled to have a bronchoscopy by her pulmonologist this upcoming Monday, however due to worsening shortness of breath, some hallucinations she was brought in to be evaluated.  According to husband whenever she lays flat, there is a lot of gurgling, he feels like she is unable to breathe.  Patient also has underlying Wegener's, per rheumatologist was also instructed to be seen in the emergency department today.None smoker, on no blood thinners, no prior hx of blood clots.  ? ?Review of Systems  ?Positive: SOB, FEVER, productive cough ?Negative: Chest pain, leg swelling ? ?Physical Exam  ?BP 120/70   Pulse 91   Temp (!) 102.9 ?F (39.4 ?C) (Oral)   Resp 16   SpO2 99%  ?Gen:   Awake, some tachypnea present ?Resp:  Normal effort  ?MSK:   Moves extremities without difficulty  ?Other:  BL amputation to forefoot, wheelchair bound.  ? ?Medical Decision Making  ?Medically screening exam initiated at 4:57 PM.  Appropriate orders placed.  Ivi Griffith was informed that the remainder of the evaluation will be completed by another provider, this initial triage assessment does not replace that evaluation, and the importance of remaining in the ED until their evaluation is complete. ? ? ?  ?Claude Manges, PA-C ?01/04/22 1659 ? ?  ?Claude Manges, PA-C ?01/04/22 1659 ? ?

## 2022-01-04 NOTE — Assessment & Plan Note (Signed)
Stable

## 2022-01-04 NOTE — Assessment & Plan Note (Addendum)
CT of the chest done 01/04/2022 showed interval debridement of multifocal bilateral groundglass airspace disease with greatest consolidation in the right upper lobe.  Pt established with [facility]ologist in [place]. Has bronchoscopy setup for this coming Monday. Offered to refer patient to [facility] but pt and husband want to keep current pulmonologist for now. ?

## 2022-01-04 NOTE — Subjective & Objective (Signed)
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  Most pressing on her dysuria and fever for last 2 to 3 days.  Patient also also been having hallucinations and decreased/altered mental status at night for approximately 2 months.  She is also been having increased cough. ? ?Patient is on prednisone 5 mg a day, methotrexate weekly, Bactrim 3 times a week for her Wegener's. ? ?She has been recently seeing [facility] pulmonology in [place] and has a bronchoscopy set up for this coming Monday. ? ?She has had 2 to 3 days of dysuria.  Fevers intermittently but was noted to be febrile to 102 in the ER today. ? ?As far as her altered mental status, patient was seen by [facility] pain clinic in early February 2023.  At that time her intrathecal pain pump which contains Prialt was increased by 10%.  Her oxycodone was also stopped and she was restarted on Nucynta 75 mg in the morning, 75 mg in the afternoon, 150 mg at bedtime. ? ?Since that medication change, the husband has noticed the patient been increasingly somnolent at night after she goes to bed.  She takes the 150 mg of Nucynta he goes to bed.  He frequently finds patient sitting up in bed but has passed out leaning forward with her head on the bed.  He also noticed the patient hallucinating at night claiming that she needs to get out of bed and leave the house. ? ?In the ER, temperature 102.9 on arrival, heart rate 91, blood pressure 120/70, satting 99% on room air. ? ?Labs showed sodium 136, bicarb of 18, BUN of 14, creatinine 1.1 ? ?White count 19.6, hemoglobin 10.7, platelets 380. ? ?UA showed positive nitrates, positive leukocyte esterase,  greater than 50 white cells, many bacteria. ? ?Chest x-ray demonstrated some patchy right upper lobe airspace disease. ? ?However her high-resolution chest CT from 12/20/2021 showed postinflammatory versus postinfectious volume loss in the right middle lobe and right lower lobe with elevation hemidiaphragm.  There is also groundglass opacity in the right apex. ? ?Due to the patient's constellation of symptoms, [facility] contacted for admission. ?

## 2022-01-04 NOTE — Assessment & Plan Note (Addendum)
Continue prednisone 5 mg daily for Wegener's granulomatosis. ?

## 2022-01-04 NOTE — Assessment & Plan Note (Signed)
Stable. Does not wear CPAP at night. ?

## 2022-01-04 NOTE — Assessment & Plan Note (Addendum)
Likely left rotator cuff dysfunction vs tear.  Will need outpatient orthopedic follow-up. ?

## 2022-01-04 NOTE — Assessment & Plan Note (Addendum)
Continue prednisone 5mg daily

## 2022-01-04 NOTE — ED Provider Notes (Signed)
?[facility] ?Provider Note ? ? ?CSN: [id number] ?Arrival date & time: 01/04/22  1619 ? ?  ? ?History ? ?Chief Complaint  ?Patient presents with  ? Shortness of Breath  ? ? ?Stacey CromerLisa Philippi is a 56 y.o. female. ? ?56 year old female with prior medical history as detailed below presents for evaluation.  Patient reports worsening shortness of breath over the last 3 to 4 days.  Patient reports fevers at home.  Patient is on weekly methotrexate and daily prednisone for treatment of previously diagnosed Wegener's granulomatosis. ? ?Patient reports worsening cough, productive of sputum. ? ?Patient reports that she takes intermittent Bactrim. ? ?She complains of diffuse body aches.  She complains of increased urinary frequency. ? ?The history is provided by the patient and medical records.  ?Shortness of Breath ?Severity:  Moderate ?Onset quality:  Gradual ?Duration:  3 days ?Timing:  Constant ?Progression:  Worsening ?Chronicity:  New ?Context: activity   ?Associated symptoms: cough and fever   ? ?  ? ?Home Medications ?Prior to Admission medications   ?Medication Sig Start Date End Date Taking? Authorizing Provider  ?acetaminophen (TYLENOL) 325 MG tablet Take 2 tablets (650 mg total) by mouth every 6 (six) hours as needed for mild pain (or Fever >/= 101). 07/12/16   Joseph ArtVann, Jessica U, DO  ?albuterol (PROVENTIL HFA;VENTOLIN HFA) 108 (90 Base) MCG/ACT inhaler Inhale 2 puffs into the lungs every 6 (six) hours as needed for wheezing or shortness of breath.    [provider]  ?amitriptyline (ELAVIL) 50 MG tablet Take 50 mg by mouth at bedtime. 11/04/21   [provider]  ?amoxicillin-clavulanate (AUGMENTIN) 875-125 MG tablet Take 1 tablet by mouth 2 (two) times daily. 06/01/21   Randall HissVan Dam, Cornelius N, MD  ?b complex vitamins tablet Take 1 tablet by mouth daily.    [provider]  ?Calcium Carb-Cholecalciferol (CALCIUM CARBONATE-VITAMIN D3 PO) Take 1 tablet by mouth  daily.    [provider]  ?cephALEXin (KEFLEX) 500 MG capsule Take 1 capsule (500 mg total) by mouth 4 (four) times daily. 11/19/21   Vanetta MuldersZackowski, Scott, MD  ?docusate sodium (COLACE) 100 MG capsule Take 1 capsule (100 mg total) by mouth 2 (two) times daily. While taking narcotic pain medicine. 03/25/21   Jacinta Shoellis, Justin Pike, PA-C  ?DULoxetine (CYMBALTA) 60 MG capsule Take 60 mg by mouth daily.    [provider]  ?famotidine (PEPCID) 20 MG tablet Take 20 mg by mouth daily. 01/12/20   [place], Daleon Willinger M, MD  ?fluconazole (DIFLUCAN) 200 MG tablet Take 2 tablets (400 mg total) by mouth daily. 06/01/21   Randall HissVan Dam, Cornelius N, MD  ?folic acid (FOLVITE) 1 MG tablet Take 2 mg by mouth daily.    [provider]  ?furosemide (LASIX) 40 MG tablet Take 1 tablet (40 mg total) by mouth as directed. ?Patient taking differently: Take 40 mg by mouth daily. 09/06/20   [place]Jordan, Logan Vegh M, MD  ?gabapentin (NEURONTIN) 300 MG capsule Take 600-900 mg by mouth See admin instructions. Take 3 capsules (900 mg total) by mouth in the morning, 2 capsule and 4 capsules (1200 mg total) in the evening.    [provider]  ?levofloxacin (LEVAQUIN) 500 MG tablet Take 500 mg by mouth daily. 11/15/21   [provider]  ?lidocaine (LIDODERM) 5 % Place 1 patch onto the skin daily as needed (for pain). Remove & Discard patch within 12 hours or as directed by MD    [provider]  ?  linaclotide (LINZESS) 145 MCG CAPS capsule  09/21/21   [provider]  ?magnesium oxide (MAG-OX) 400 MG tablet Take 400 mg by mouth daily.    [provider]  ?Melatonin 10 MG TABS Take 1 tablet by mouth at bedtime as needed (sleep).    [provider]  ?METHOTREXATE SODIUM IJ Inject 25 mg into the skin every Sunday.    [provider]  ?naloxone (NARCAN) nasal spray 4 mg/0.1 mL Place into the nose. 12/11/19   [provider]  ?oxyCODONE (OXY IR/ROXICODONE) 5 MG immediate release tablet Take  5 mg by mouth 3 (three) times daily as needed. 04/07/21   [provider]  ?oxyCODONE-acetaminophen (PERCOCET/ROXICET) 5-325 MG tablet Take 1 tablet by mouth every 6 (six) hours as needed for severe pain. 05/11/21   Mancel Bale, MD  ?pantoprazole (PROTONIX) 40 MG tablet Take 40 mg by mouth daily. 07/01/21   [provider]  ?Potassium Chloride Crys ER (K-DUR PO) Take 40 mEq by mouth every other day.    [provider]  ?predniSONE (DELTASONE) 20 MG tablet Take 1 tablet (20 mg total) by mouth 2 (two) times daily. ?Patient taking differently: Take 5 mg by mouth daily. 05/11/21   Mancel Bale, MD  ?promethazine-dextromethorphan (PROMETHAZINE-DM) 6.25-15 MG/5ML syrup Take 5-10 mLs by mouth at bedtime as needed for cough. 11/07/21   [provider]  ?propranolol (INDERAL) 40 MG tablet Take 40 mg by mouth daily. 07/21/21   [provider]  ?rosuvastatin (CRESTOR) 10 MG tablet Take 10 mg by mouth at bedtime. 10/25/21   [provider]  ?senna (SENOKOT) 8.6 MG TABS tablet Take 2 tablets (17.2 mg total) by mouth 2 (two) times daily. 03/25/21   Jacinta Shoe, PA-C  ?sulfamethoxazole-trimethoprim (BACTRIM) 400-80 MG tablet Take 1 tablet by mouth 3 (three) times a week. Monday,Wednesday and Friday 07/13/21   [provider]  ?tizanidine (ZANAFLEX) 2 MG capsule Take 2 mg by mouth at bedtime. 05/10/17   [provider]  ?topiramate (TOPAMAX) 100 MG tablet Take 100 mg by mouth daily. 01/14/18   [provider]  ?traZODone (DESYREL) 100 MG tablet Take 100 mg by mouth at bedtime. 01/12/20   [place], Kerstyn Coryell M, MD  ?vitamin B-12 (CYANOCOBALAMIN) 1000 MCG tablet Take 1,000 mcg by mouth daily.    [provider]  ?Vitamin D, Ergocalciferol, (DRISDOL) 50000 units CAPS capsule Take 50,000 Units by mouth every Friday.    [provider]  ?   ? ?Allergies    ?Ibuprofen and Penicillins   ? ?Review of Systems   ?Review of Systems  ?Constitutional:   Positive for fever.  ?Respiratory:  Positive for cough and shortness of breath.   ?All other systems reviewed and are negative. ? ?Physical Exam ?Updated Vital Signs ?BP 111/68   Pulse 92   Temp (!) 102.9 ?F (39.4 ?C) (Oral)   Resp (!) 24   SpO2 97%  ?Physical Exam ?Vitals and nursing note reviewed.  ?Constitutional:   ?   General: She is not in acute distress. ?   Appearance: Normal appearance. She is well-developed.  ?HENT:  ?   Head: Normocephalic and atraumatic.  ?Eyes:  ?   Conjunctiva/sclera: Conjunctivae normal.  ?   Pupils: Pupils are equal, round, and reactive to light.  ?Cardiovascular:  ?   Rate and Rhythm: Normal rate and regular rhythm.  ?   Heart sounds: Normal heart sounds.  ?Pulmonary:  ?   Effort: Pulmonary effort is normal. No  respiratory distress.  ?   Breath sounds: Examination of the right-lower field reveals decreased breath sounds. Examination of the left-lower field reveals decreased breath sounds. Decreased breath sounds present.  ?Abdominal:  ?   General: There is no distension.  ?   Palpations: Abdomen is soft.  ?   Tenderness: There is no abdominal tenderness.  ?Musculoskeletal:     ?   General: No deformity. Normal range of motion.  ?   Cervical back: Normal range of motion and neck supple.  ?Skin: ?   General: Skin is warm and dry.  ?Neurological:  ?   General: No focal deficit present.  ?   Mental Status: She is alert and oriented to person, place, and time.  ? ? ?ED Results / Procedures / Treatments   ?Labs ?(all labs ordered are listed, but only abnormal results are displayed) ?Labs Reviewed  ?CBC WITH DIFFERENTIAL/PLATELET - Abnormal; Notable for the following components:  ?    Result Value  ? WBC 19.6 (*)   ? Hemoglobin 10.7 (*)   ? HCT 35.0 (*)   ? RDW 17.8 (*)   ? Neutro Abs 11.9 (*)   ? Lymphs Abs 6.4 (*)   ? Abs Immature Granulocytes 0.13 (*)   ? All other components within normal limits  ?COMPREHENSIVE METABOLIC PANEL - Abnormal; Notable for the following components:  ?  CO2 18 (*)   ? Creatinine, Ser 1.10 (*)   ? Calcium 8.6 (*)   ? Total Protein 5.9 (*)   ? Albumin 2.9 (*)   ? GFR, Estimated 59 (*)   ? All other components within normal limits  ?URINALYSIS, ROUTINE W REFLEX MICR

## 2022-01-04 NOTE — Assessment & Plan Note (Addendum)
Decreased Nucynta bedtime dose to 100 mg at bedtime.  Continue with 75 mg in the morning and at lunchtime. ?

## 2022-01-04 NOTE — H&P (Signed)
?History and Physical  ? ? ?Anaih Brander KGM:[id number] DOB: Jan 09, 1966 DOA: 01/04/2022 ? ?DOS: the patient was seen and examined on 01/04/2022 ? ?PCP: Eartha Inch, MD  ? ?Patient coming from: Home ? ?I have personally briefly reviewed patient's old medical records in [facility] ? 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  Most pressing on her dysuria and fever for last 2 to 3 days.  Patient also also been having hallucinations and decreased/altered mental status at night for approximately 2 months.  She is also been having increased cough. ? ?Patient is on prednisone 5 mg a day, methotrexate weekly, Bactrim 3 times a week for her Wegener's. ? ?She has been recently seeing [facility] pulmonology in [place] and has a bronchoscopy set up for this coming Monday. ? ?She has had 2 to 3 days of dysuria.  Fevers intermittently but was noted to be febrile to 102 in the ER today. ? ?As far as her altered mental status, patient was seen by [facility] pain clinic in early February 2023.  At that time her intrathecal pain pump which contains Prialt was increased by 10%.  Her oxycodone was also stopped and she was restarted on Nucynta 75 mg in the morning, 75 mg in the afternoon, 150 mg at bedtime. ? ?Since that medication change, the husband has noticed the patient been increasingly somnolent at night after she goes to bed.  She takes the 150 mg of Nucynta he goes to bed.  He frequently finds patient sitting up in bed but has passed out leaning forward with her head on the bed.  He also noticed the patient hallucinating at night claiming that she needs to get out of bed and leave the  house. ? ?In the ER, temperature 102.9 on arrival, heart rate 91, blood pressure 120/70, satting 99% on room air. ? ?Labs showed sodium 136, bicarb of 18, BUN of 14, creatinine 1.1 ? ?White count 19.6, hemoglobin 10.7, platelets 380. ? ?UA showed positive nitrates, positive leukocyte esterase, greater than 50 white cells, many bacteria. ? ?Chest x-ray demonstrated some patchy right upper lobe airspace disease. ? ?However her high-resolution chest CT from 12/20/2021 showed postinflammatory versus postinfectious volume loss in the right middle lobe and right lower lobe with elevation hemidiaphragm.  There is also groundglass opacity in the right apex. ? ?Due to the patient's constellation of symptoms, [facility] hospitalist contacted for admission.  ? ?ED Course: febrile on admission. Not hypoxic. WBC 19. ??RUL infiltrate on CXR. UA positive for LE, nitrates, bacteria.  ? ?Review of Systems:  ?Review of Systems  ?Constitutional:  Positive for fever.  ?     Fever today  ?HENT: Negative.    ?Eyes: Negative.   ?Respiratory:  Positive for cough and sputum production.   ?     Chronic cough, sputum production for 2-3 months  ?Cardiovascular: Negative.   ?Gastrointestinal: Negative.   ?Genitourinary:  Positive for dysuria.  ?     Dysuria for 2-3 days  ?Musculoskeletal:  Positive for back pain and myalgias.  ?Skin: Negative.   ?Neurological:   ?     Chronic neuropathy ? ?Husband states pt with visual hallucinations, stupor at nighttime for last 4-6 weeks  ?All other systems reviewed and are  negative. ? ?Past Medical History:  ?Diagnosis Date  ? Chronic cough   ? Chronic pain   ? Diabetes mellitus without complication (HCC)   ? Dyspnea   ? GERD (gastroesophageal reflux disease)   ? Hypokalemia   ? Hypothyroidism   ? Left wrist pain 06/01/2021  ? Myonecrosis (HCC) 07/01/2021  ? Neurocardiogenic syncope   ? OSA (obstructive sleep apnea)   ? does not use CPAP  ? Osteomyelitis (HCC)   ? bilateral feet  ? Peripheral neuropathy   ? Presence  of intrathecal pump   ? recieves Prialt/bupivicaine  ? Tear of gluteus medius tendon 07/01/2021  ? Wegener's granulomatosis 2009  ? Wegner's disease (congenital syphilitic osteochondritis)   ? ? ?Past Surgical History:  ?Procedure Laterality Date  ? AMPUTATION Right 10/21/2020  ? Procedure: Right hallux amputation;  Surgeon: Toni ArthursHewitt, John, MD;  Location: [facility];  Service: Orthopedics;  Laterality: Right;  ? AMPUTATION TOE Bilateral 08/12/2020  ? Procedure: Right 3rd toe amputation; left hallux and 3rd toe amputation;  Surgeon: Toni ArthursHewitt, John, MD;  Location: [facility];  Service: Orthopedics;  Laterality: Bilateral;  45min  ? AMPUTATION TOE Right 03/24/2021  ? Procedure: AMPUTATION TOE;  Surgeon: Toni ArthursHewitt, John, MD;  [facility];  Service: Orthopedics;  Laterality: Right;  ? APPENDECTOMY    ? BACK SURGERY    ? CHOLECYSTECTOMY    ? COLONOSCOPY N/A 06/02/2013  ? Procedure: COLONOSCOPY;  Surgeon: Graylin ShiverSalem F Ganem, MD;  [facility];  Service: Endoscopy;  Laterality: N/A;  ? HERNIA REPAIR  2000  ? INCISION AND DRAINAGE ABSCESS Left 07/07/2016  ? Procedure: DEBRIDMENT LEFT THIGH ABSCESS, EXISION ACUTE SKIN RASH LEFT THIGH(1CM LESION);  Surgeon: Claud KelpHaywood Ingram, MD;  Location: [facility];  Service: General;  Laterality: Left;  ? SEPTOPLASTY  02/2013  ? spleen anuyism    ? splenic aneurysm    ? TRANSMETATARSAL AMPUTATION Bilateral 10/21/2020  ? Procedure: Left transmetatarsal amputation;  Surgeon: Toni ArthursHewitt, John, MD;  Location: [facility];  Service: Orthopedics;  Laterality: Bilateral;  ? TRANSMETATARSAL AMPUTATION Left 03/24/2021  ? Procedure: TRANSMETATARSAL AMPUTATION;  Surgeon: Toni ArthursHewitt, John, MD;  [facility];  Service: Orthopedics;  Laterality: Left;  ? VAGINAL HYSTERECTOMY    ? ? ? reports that she has never smoked. She has never used smokeless tobacco. She reports current alcohol use. She reports that she does not use drugs. ? ?Allergies  ?Allergen Reactions  ? Ibuprofen  Other (See Comments)  ?  Pt is unable to take this due to kidney problems.   ?  ? Penicillins Rash and Other (See Comments)  ?  Has patient had a PCN reaction causing immediate rash, facial/tongue/throat swelling, SOB or lightheadedness with hypotension: No ?Has patient had a PCN reaction causing severe rash involving mucus membranes or skin necrosis: No ?Has patient had a PCN reaction that required hospitalization No ?Has patient had a PCN reaction occurring within the last 10 years: No ?If all of the above answers are "NO", then may proceed with Cephalosporin use.  ? ? ?Family History  ?Problem Relation Age of Onset  ? Diabetes Mother   ? Heart disease Mother   ? Diabetes Father   ? ? ?Prior to Admission medications   ?Medication Sig Start Date End Date Taking? Authorizing Provider  ?acetaminophen (TYLENOL) 325 MG tablet Take 2 tablets (650 mg total) by mouth every 6 (six) hours as needed for mild pain (or Fever >/= 101). 07/12/16  Yes Marlin CanaryVann, Jessica  U, DO  ?albuterol (PROVENTIL HFA;VENTOLIN HFA) 108 (90 Base) MCG/ACT inhaler Inhale 2 puffs into the lungs every 6 (six) hours as needed for wheezing or shortness of breath.   Yes [provider]  ?amitriptyline (ELAVIL) 50 MG tablet Take 50 mg by mouth at bedtime. 11/04/21  Yes [provider]  ?Calcium Carb-Cholecalciferol (CALCIUM CARBONATE-VITAMIN D3 PO) Take 1 tablet by mouth daily.   Yes [provider]  ?DULoxetine (CYMBALTA) 60 MG capsule Take 60 mg by mouth daily.   Yes [provider]  ?folic acid (FOLVITE) 1 MG tablet Take 2 mg by mouth daily.   Yes [provider]  ?gabapentin (NEURONTIN) 300 MG capsule Take 600-900 mg by mouth See admin instructions. Take 3 capsules (900 mg total) by mouth in the morning, 2 capsule and 4 capsules (1200 mg total) in the evening.   Yes [provider]  ?lidocaine (LIDODERM) 5 % Place 1 patch onto the skin daily as needed (for pain). Remove & Discard patch within 12 hours or  as directed by MD   Yes [provider]  ?linaclotide Karlene Einstein) 145 MCG CAPS capsule  09/21/21  Yes [provider]  ?Melatonin 10 MG TABS Take 1 tablet by mouth at bedtime as needed (sleep).

## 2022-01-05 DIAGNOSIS — N3000 Acute cystitis without hematuria: Secondary | ICD-10-CM | POA: Diagnosis present

## 2022-01-05 DIAGNOSIS — E2749 Other adrenocortical insufficiency: Secondary | ICD-10-CM | POA: Diagnosis present

## 2022-01-05 DIAGNOSIS — J3489 Other specified disorders of nose and nasal sinuses: Secondary | ICD-10-CM | POA: Diagnosis present

## 2022-01-05 DIAGNOSIS — G4733 Obstructive sleep apnea (adult) (pediatric): Secondary | ICD-10-CM | POA: Diagnosis present

## 2022-01-05 DIAGNOSIS — E1140 Type 2 diabetes mellitus with diabetic neuropathy, unspecified: Secondary | ICD-10-CM | POA: Diagnosis present

## 2022-01-05 DIAGNOSIS — Z79899 Other long term (current) drug therapy: Secondary | ICD-10-CM | POA: Diagnosis not present

## 2022-01-05 DIAGNOSIS — G8929 Other chronic pain: Secondary | ICD-10-CM | POA: Diagnosis present

## 2022-01-05 DIAGNOSIS — Z20822 Contact with and (suspected) exposure to covid-19: Secondary | ICD-10-CM | POA: Diagnosis present

## 2022-01-05 DIAGNOSIS — R4182 Altered mental status, unspecified: Secondary | ICD-10-CM | POA: Diagnosis present

## 2022-01-05 DIAGNOSIS — R443 Hallucinations, unspecified: Secondary | ICD-10-CM | POA: Diagnosis present

## 2022-01-05 DIAGNOSIS — Z833 Family history of diabetes mellitus: Secondary | ICD-10-CM | POA: Diagnosis not present

## 2022-01-05 DIAGNOSIS — E1122 Type 2 diabetes mellitus with diabetic chronic kidney disease: Secondary | ICD-10-CM | POA: Diagnosis present

## 2022-01-05 DIAGNOSIS — Z8249 Family history of ischemic heart disease and other diseases of the circulatory system: Secondary | ICD-10-CM | POA: Diagnosis not present

## 2022-01-05 DIAGNOSIS — J189 Pneumonia, unspecified organism: Secondary | ICD-10-CM | POA: Diagnosis present

## 2022-01-05 DIAGNOSIS — M313 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: Secondary | ICD-10-CM | POA: Diagnosis present

## 2022-01-05 DIAGNOSIS — I129 Hypertensive chronic kidney disease with stage 1 through stage 4 chronic kidney disease, or unspecified chronic kidney disease: Secondary | ICD-10-CM | POA: Diagnosis present

## 2022-01-05 DIAGNOSIS — T402X5A Adverse effect of other opioids, initial encounter: Secondary | ICD-10-CM | POA: Diagnosis present

## 2022-01-05 DIAGNOSIS — Z7952 Long term (current) use of systemic steroids: Secondary | ICD-10-CM | POA: Diagnosis not present

## 2022-01-05 DIAGNOSIS — E039 Hypothyroidism, unspecified: Secondary | ICD-10-CM | POA: Diagnosis present

## 2022-01-05 DIAGNOSIS — D6489 Other specified anemias: Secondary | ICD-10-CM | POA: Diagnosis present

## 2022-01-05 DIAGNOSIS — B9620 Unspecified Escherichia coli [E. coli] as the cause of diseases classified elsewhere: Secondary | ICD-10-CM | POA: Diagnosis present

## 2022-01-05 DIAGNOSIS — K219 Gastro-esophageal reflux disease without esophagitis: Secondary | ICD-10-CM | POA: Diagnosis present

## 2022-01-05 DIAGNOSIS — N1831 Chronic kidney disease, stage 3a: Secondary | ICD-10-CM | POA: Diagnosis present

## 2022-01-05 DIAGNOSIS — G620 Drug-induced polyneuropathy: Secondary | ICD-10-CM | POA: Diagnosis present

## 2022-01-05 DIAGNOSIS — E669 Obesity, unspecified: Secondary | ICD-10-CM | POA: Diagnosis present

## 2022-01-05 LAB — CBC WITH DIFFERENTIAL/PLATELET
Abs Immature Granulocytes: 0.13 10*3/uL — ABNORMAL HIGH (ref 0.00–0.07)
Basophils Absolute: 0.1 10*3/uL (ref 0.0–0.1)
Basophils Relative: 0 %
Eosinophils Absolute: 0.5 10*3/uL (ref 0.0–0.5)
Eosinophils Relative: 3 %
HCT: 32.9 % — ABNORMAL LOW (ref 36.0–46.0)
Hemoglobin: 9.8 g/dL — ABNORMAL LOW (ref 12.0–15.0)
Immature Granulocytes: 1 %
Lymphocytes Relative: 34 %
Lymphs Abs: 5.9 10*3/uL — ABNORMAL HIGH (ref 0.7–4.0)
MCH: 25.9 pg — ABNORMAL LOW (ref 26.0–34.0)
MCHC: 29.8 g/dL — ABNORMAL LOW (ref 30.0–36.0)
MCV: 86.8 fL (ref 80.0–100.0)
Monocytes Absolute: 0.7 10*3/uL (ref 0.1–1.0)
Monocytes Relative: 4 %
Neutro Abs: 9.9 10*3/uL — ABNORMAL HIGH (ref 1.7–7.7)
Neutrophils Relative %: 58 %
Platelets: 351 10*3/uL (ref 150–400)
RBC: 3.79 MIL/uL — ABNORMAL LOW (ref 3.87–5.11)
RDW: 18 % — ABNORMAL HIGH (ref 11.5–15.5)
WBC: 17.2 10*3/uL — ABNORMAL HIGH (ref 4.0–10.5)
nRBC: 0 % (ref 0.0–0.2)

## 2022-01-05 LAB — COMPREHENSIVE METABOLIC PANEL
ALT: 24 U/L (ref 0–44)
AST: 22 U/L (ref 15–41)
Albumin: 2.7 g/dL — ABNORMAL LOW (ref 3.5–5.0)
Alkaline Phosphatase: 95 U/L (ref 38–126)
Anion gap: 15 (ref 5–15)
BUN: 13 mg/dL (ref 6–20)
CO2: 20 mmol/L — ABNORMAL LOW (ref 22–32)
Calcium: 8.4 mg/dL — ABNORMAL LOW (ref 8.9–10.3)
Chloride: 108 mmol/L (ref 98–111)
Creatinine, Ser: 1.02 mg/dL — ABNORMAL HIGH (ref 0.44–1.00)
GFR, Estimated: >60 mL/min (ref >60)
Glucose, Bld: 101 mg/dL — ABNORMAL HIGH (ref 70–99)
Potassium: 3.6 mmol/L (ref 3.5–5.1)
Sodium: 143 mmol/L (ref 135–145)
Total Bilirubin: 0.5 mg/dL (ref 0.3–1.2)
Total Protein: 5.4 g/dL — ABNORMAL LOW (ref 6.5–8.1)

## 2022-01-05 LAB — MAGNESIUM: Magnesium: 2.1 mg/dL (ref 1.7–2.4)

## 2022-01-05 LAB — HIV ANTIBODY (ROUTINE TESTING W REFLEX): HIV Screen 4th Generation wRfx: NONREACTIVE

## 2022-01-05 LAB — LACTIC ACID, PLASMA: Lactic Acid, Venous: 1.8 mmol/L (ref 0.5–1.9)

## 2022-01-05 MED ORDER — TIZANIDINE HCL 4 MG PO TABS
2.0000 mg | ORAL_TABLET | Freq: Every day | ORAL | Status: DC
  Administered 2022-01-05 – 2022-01-07 (×3): 2 mg via ORAL
  Filled 2022-01-05 (×3): qty 1

## 2022-01-05 MED ORDER — ACETAMINOPHEN 325 MG PO TABS
650.0000 mg | ORAL_TABLET | Freq: Four times a day (QID) | ORAL | Status: DC | PRN
  Administered 2022-01-06 – 2022-01-08 (×5): 650 mg via ORAL
  Filled 2022-01-05 (×5): qty 2

## 2022-01-05 MED ORDER — DULOXETINE HCL 60 MG PO CPEP
60.0000 mg | ORAL_CAPSULE | Freq: Every day | ORAL | Status: DC
Start: 2022-01-05 — End: 2022-01-08
  Administered 2022-01-06 – 2022-01-08 (×3): 60 mg via ORAL
  Filled 2022-01-05 (×3): qty 1

## 2022-01-05 MED ORDER — HYDROCOD POLI-CHLORPHE POLI ER 10-8 MG/5ML PO SUER
5.0000 mL | Freq: Two times a day (BID) | ORAL | Status: DC
  Administered 2022-01-05 – 2022-01-08 (×7): 5 mL via ORAL
  Filled 2022-01-05 (×7): qty 5

## 2022-01-05 MED ORDER — ACETAMINOPHEN 650 MG RE SUPP: 650.0000 mg | Freq: Four times a day (QID) | RECTAL | Status: DC | PRN

## 2022-01-05 MED ORDER — ROSUVASTATIN CALCIUM 5 MG PO TABS
10.0000 mg | ORAL_TABLET | Freq: Every day | ORAL | Status: DC
  Administered 2022-01-05 – 2022-01-07 (×3): 10 mg via ORAL
  Filled 2022-01-05 (×3): qty 2

## 2022-01-05 MED ORDER — GABAPENTIN 400 MG PO CAPS
1200.0000 mg | ORAL_CAPSULE | Freq: Every day | ORAL | Status: DC
  Administered 2022-01-05 – 2022-01-07 (×3): 1200 mg via ORAL
  Filled 2022-01-05 (×3): qty 3

## 2022-01-05 MED ORDER — MELATONIN 5 MG PO TABS: 10.0000 mg | ORAL_TABLET | Freq: Every evening | ORAL | Status: DC | PRN

## 2022-01-05 MED ORDER — SULFAMETHOXAZOLE-TRIMETHOPRIM 400-80 MG PO TABS
1.0000 | ORAL_TABLET | ORAL | Status: DC
  Administered 2022-01-06: 1 via ORAL
  Filled 2022-01-05: qty 1

## 2022-01-05 MED ORDER — TOPIRAMATE 25 MG PO TABS
100.0000 mg | ORAL_TABLET | Freq: Every day | ORAL | Status: DC
  Administered 2022-01-06 – 2022-01-08 (×3): 100 mg via ORAL
  Filled 2022-01-05 (×3): qty 4

## 2022-01-05 MED ORDER — LINACLOTIDE 145 MCG PO CAPS
145.0000 ug | ORAL_CAPSULE | Freq: Every day | ORAL | Status: DC
  Administered 2022-01-05 – 2022-01-08 (×4): 145 ug via ORAL
  Filled 2022-01-05 (×5): qty 1

## 2022-01-05 MED ORDER — GABAPENTIN 300 MG PO CAPS
900.0000 mg | ORAL_CAPSULE | Freq: Every day | ORAL | Status: DC
  Administered 2022-01-05 – 2022-01-08 (×4): 900 mg via ORAL
  Filled 2022-01-05 (×4): qty 3

## 2022-01-05 MED ORDER — AMITRIPTYLINE HCL 50 MG PO TABS
50.0000 mg | ORAL_TABLET | Freq: Every day | ORAL | Status: DC
  Administered 2022-01-05 – 2022-01-07 (×3): 50 mg via ORAL
  Filled 2022-01-05 (×3): qty 1

## 2022-01-05 MED ORDER — ENOXAPARIN SODIUM 40 MG/0.4ML IJ SOSY
40.0000 mg | PREFILLED_SYRINGE | INTRAMUSCULAR | Status: DC
  Administered 2022-01-05 – 2022-01-08 (×4): 40 mg via SUBCUTANEOUS
  Filled 2022-01-05 (×4): qty 0.4

## 2022-01-05 MED ORDER — TAPENTADOL HCL 50 MG PO TABS
100.0000 mg | ORAL_TABLET | Freq: Every day | ORAL | Status: DC
  Administered 2022-01-05 – 2022-01-07 (×3): 100 mg via ORAL
  Filled 2022-01-05 (×3): qty 2

## 2022-01-05 MED ORDER — TAPENTADOL HCL 50 MG PO TABS
75.0000 mg | ORAL_TABLET | Freq: Two times a day (BID) | ORAL | Status: DC
Start: 2022-01-05 — End: 2022-01-08
  Administered 2022-01-05 – 2022-01-08 (×7): 75 mg via ORAL
  Filled 2022-01-05 (×7): qty 2

## 2022-01-05 MED ORDER — TRAZODONE HCL 50 MG PO TABS
100.0000 mg | ORAL_TABLET | Freq: Every day | ORAL | Status: DC
  Administered 2022-01-05 – 2022-01-07 (×3): 100 mg via ORAL
  Filled 2022-01-05 (×3): qty 2

## 2022-01-05 MED ORDER — PREDNISONE 5 MG PO TABS
5.0000 mg | ORAL_TABLET | Freq: Every day | ORAL | Status: DC
  Administered 2022-01-05 – 2022-01-08 (×4): 5 mg via ORAL
  Filled 2022-01-05 (×4): qty 1

## 2022-01-05 MED ORDER — PANTOPRAZOLE SODIUM 40 MG PO TBEC
40.0000 mg | DELAYED_RELEASE_TABLET | Freq: Every day | ORAL | Status: DC
  Administered 2022-01-05 – 2022-01-08 (×4): 40 mg via ORAL
  Filled 2022-01-05 (×4): qty 1

## 2022-01-05 MED ORDER — ONDANSETRON HCL 4 MG PO TABS: 4.0000 mg | ORAL_TABLET | Freq: Four times a day (QID) | ORAL | Status: DC | PRN

## 2022-01-05 MED ORDER — ONDANSETRON HCL 4 MG/2ML IJ SOLN: 4.0000 mg | Freq: Four times a day (QID) | INTRAMUSCULAR | Status: DC | PRN

## 2022-01-05 MED ORDER — GABAPENTIN 300 MG PO CAPS
600.0000 mg | ORAL_CAPSULE | Freq: Every day | ORAL | Status: DC
  Administered 2022-01-05 – 2022-01-07 (×3): 600 mg via ORAL
  Filled 2022-01-05 (×3): qty 2

## 2022-01-05 MED ORDER — PROPRANOLOL HCL 40 MG PO TABS
40.0000 mg | ORAL_TABLET | Freq: Every day | ORAL | Status: DC
  Administered 2022-01-07 – 2022-01-08 (×2): 40 mg via ORAL
  Filled 2022-01-05 (×4): qty 1

## 2022-01-05 NOTE — Assessment & Plan Note (Addendum)
History of Wegener's granulomatosis.  Supposed to follow-up with [facility] pulmonary as outpatient for bronchoscopy.  Continue antibiotic with vancomycin and cefepime., supportive care.  I have encouraged her to follow-up with pulmonary at [facility] to keep this appointment. ?

## 2022-01-05 NOTE — Assessment & Plan Note (Addendum)
UA abnormal.  On antibiotic.  Was febrile with  leukocytosis on presentation.  Follow blood culture, urine culture ?

## 2022-01-05 NOTE — Hospital Course (Signed)
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  Patient is on prednisone and methotrexate and Bactrim for her Wegener's disease. She has been recently seeing [facility] pul[facility]ology in [place] and has a bronchoscopy set up for this coming Monday.  Patient has been having some altered mental status for a few months. patient was seen by [facility] pain clinic in early February 2023.  At that time her intrathecal pain pump which contains Prialt was increased by 10%.  Her oxycodone was also stopped and she was restarted on Nucynta 75 mg in the morning, 75 mg in the afternoon, 150 mg at bedtime.Since that medication change, the husband has noticed the patient been increasingly somnolent at night after she goes to bed.  ? ?In the ED, patient was febrile.  WBC at 19.6.  Marland KitchenUA showed positive nitrates, positive leukocyte esterase, greater than 50 white cells, many bacteria.  Chest x-ray demonstrated some patchy right upper lobe airspace disease.  High-resolution CT from 12/20/2021 showed postinflammatory versus postinfectious volume loss in the right middle lobe and right lower lobe with elevation hemidiaphragm.  There is also groundglass opacity in the right apex. Due to the patient's constellation of symptoms, [facility] hospitalist contacted for admission.  ?

## 2022-01-05 NOTE — Progress Notes (Signed)
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  Patient is on prednisone and methotrexate and Bactrim for her Wegener's disease. She has been recently seeing [facility] in [place] and has a bronchoscopy set up for this coming Monday.  Patient has been having some altered mental status for a few months. patient was seen by [facility] in early February 2023.  At that time her intrathecal pain pump which contains Prialt was increased by 10%.  Her oxycodone was also stopped and she was restarted on Nucynta 75 mg in the morning, 75 mg in the afternoon, 150 mg at bedtime.Since that medication change, the husband has noticed the patient been increasingly somnolent at night after she goes to bed.   In the ED, patient was febrile.  WBC at 19.6.  Marland KitchenUA showed positive nitrates, positive leukocyte esterase, greater than 50 white cells, many bacteria.  Chest x-ray demonstrated some patchy right upper lobe airspace disease.  High-resolution CT from 12/20/2021 showed postinflammatory versus postinfectious volume loss in the right middle lobe and right lower lobe with elevation hemidiaphragm.  There is also groundglass opacity in the right apex. Due to the patient's constellation of symptoms, [facility] contacted for admission.      Assessment and Plan: * Acute cystitis without hematuria UA abnormal.  On antibiotic.  Was febrile with  leukocytosis on presentation.  Follow blood culture, urine culture  Right upper lobe pneumonia History of Wegener's  granulomatosis.  Supposed to follow-up with [facility] as outpatient for bronchoscopy.  Continue antibiotic with vancomycin and cefepime., supportive care.  I have encouraged her to follow-up with pulmonary at [facility] to keep this appointment.  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  Pt established with [facility] in [place]. Has bronchoscopy setup for this coming Monday. Offered to refer patient to [facility] but pt and husband want to keep current pulmonologist for now.  Presence of intrathecal pump Intrathecal pump dose was increased by 15% on the recent clinical visit in February 2023.  OxyContin was discontinued and patient was placed her back on Nucynta.  Now with increased lethargy and hallucination. Will decrease Nucynta dose to 100 mg at night..  Patient to follow-up with [facility].  Dysfunction of left rotator cuff Likely left rotator cuff dysfunction vs tear.  Will need outpatient orthopedic follow-up.  Stage 3a chronic kidney disease (HCC) Continue to monitor BMP.  Secondary adrenal insufficiency (HCC) Continue prednisone 5 mg daily for Wegener's granulomatosis.  Chronic pain Decreased Nucynta bedtime dose to 100 mg at bedtime.  Continue with 75 mg in the morning and at lunchtime.  Chemotherapy-induced peripheral neuropathy (HCC) Chronic.  Patient on Nucynta, gabapentin, Cymbalta, Zanaflex, Elavil, Topamax, trazodone.  Essential hypertension Continue propanolol.  Nasal septal perforation Chronic.  Gastroesophageal reflux disease without esophagitis Continue Protonix.  Current chronic use of systemic steroids Continue prednisone 5 mg daily.  OSA (obstructive sleep apnea) Stable. Does not wear CPAP at night.     DVT prophylaxis: enoxaparin (LOVENOX)  injection  40 mg Start: 01/05/22 1000 SCDs Start: 01/05/22 0040   Code Status:     Code Status: Full Code  Disposition: home   Status is: Observation  The patient will require care spanning > 2 midnights and should be moved to inpatient because: IV antibiotics, need for further monitoring, follow cultures,   Family Communication: Spoke with the patient at bedside  Consultants:  None  Procedures:  None  Antimicrobials:  Bactrim DS, vancomycin, azithromycin, cefepime  Anti-infectives (From admission, onward)    Start     Dose/Rate Route Frequency Ordered Stop   01/06/22 0900  sulfamethoxazole-trimethoprim (BACTRIM) 400-80 MG per tablet 1 tablet       Note to Pharmacy: Monday,Wednesday and Friday     1 tablet Oral Once per day on Mon Wed Fri 01/05/22 0040     01/05/22 1000  vancomycin (VANCOREADY) IVPB 750 mg/150 mL       See Hyperspace for full Linked Orders Report.   750 mg 150 mL/hr over 60 Minutes Intravenous Every 12 hours 01/04/22 2147     01/05/22 1000  azithromycin (ZITHROMAX) tablet 500 mg        500 mg Oral Daily 01/04/22 2324     01/05/22 0900  vancomycin (VANCOREADY) IVPB 750 mg/150 mL  Status:  Discontinued        750 mg 150 mL/hr over 60 Minutes Intravenous Every 12 hours 01/04/22 1908 01/04/22 2052   01/04/22 2200  ceFEPIme (MAXIPIME) 2 g in sodium chloride 0.9 % 100 mL IVPB  Status:  Discontinued        2 g 200 mL/hr over 30 Minutes Intravenous Every 8 hours 01/04/22 1903 01/04/22 2052   01/04/22 2200  vancomycin (VANCOREADY) IVPB 1500 mg/300 mL       See Hyperspace for full Linked Orders Report.   1,500 mg 150 mL/hr over 120 Minutes Intravenous  Once 01/04/22 2147 01/05/22 0210   01/04/22 2200  ceFEPIme (MAXIPIME) 2 g in sodium chloride 0.9 % 100 mL IVPB        2 g 200 mL/hr over 30 Minutes Intravenous Every 8 hours 01/04/22 2147     01/04/22 2130  vancomycin (VANCOREADY) IVPB 1500 mg/300 mL  Status:  Discontinued        1,500 mg 150 mL/hr over 120  Minutes Intravenous  Once 01/04/22 2124 01/04/22 2150   01/04/22 2100  cefTRIAXone (ROCEPHIN) 1 g in sodium chloride 0.9 % 100 mL IVPB  Status:  Discontinued        1 g 200 mL/hr over 30 Minutes Intravenous Every 24 hours 01/04/22 2053 01/04/22 2147   01/04/22 1915  vancomycin (VANCOREADY) IVPB 1500 mg/300 mL  Status:  Discontinued        1,500 mg 150 mL/hr over 120 Minutes Intravenous  Once 01/04/22 1903 01/04/22 2052        Subjective: Today, patient was seen and examined at bedside.  Patient complains of shortness of breath little better today but has dysuria urgency.  Complains of productive cough with greenish phlegm production.  Objective: Vitals:   01/04/22 2354 01/05/22 0045 01/05/22 0520 01/05/22 0736  BP:  100/64 130/74 (!) 109/54  Pulse:  72 87 80  Resp:  20  17  Temp: 99 F (37.2 C) 98.7 F (37.1 C)  99.6 F (37.6 C)  TempSrc: Oral Oral  Oral  SpO2:  95% 99% 99%  Weight:  92.2 kg    Height:  5\' 6"  (1.676 m)      Intake/Output  Summary (Last 24 hours) at 01/05/2022 0917 Last data filed at 01/05/2022 1914 Gross per 24 hour  Intake 100 ml  Output --  Net 100 ml   Filed Weights   01/05/22 0045  Weight: 92.2 kg   Body mass index is 32.81 kg/m.   Physical Examination:  General: Obese built, not in obvious distress HENT:   No scleral pallor or icterus noted. Oral mucosa is moist.  Chest:   Diminished breath sounds bilaterally.  Coarse breath sounds noted. CVS: S1 &S2 heard. No murmur.  Regular rate and rhythm. Abdomen: Soft, nontender, nondistended.  Bowel sounds are heard.  Left lower quadrant pump. Extremities: No cyanosis, clubbing or edema.  Peripheral pulses are palpable.  Bilateral transmetatarsal amputation to Psych: Alert, awake and oriented, normal mood CNS:  No cranial nerve deficits.  Power equal in all extremities.   Skin: Warm and dry.  No rashes noted.  Data Reviewed:   CBC: Recent Labs  Lab 01/04/22 1708 01/05/22 0242  WBC 19.6* 17.2*   NEUTROABS 11.9* 9.9*  HGB 10.7* 9.8*  HCT 35.0* 32.9*  MCV 85.8 86.8  PLT 380 351    Basic Metabolic Panel: Recent Labs  Lab 01/04/22 1708 01/05/22 0242  NA 136 143  K 4.1 3.6  CL 106 108  CO2 18* 20*  GLUCOSE 80 101*  BUN 14 13  CREATININE 1.10* 1.02*  CALCIUM 8.6* 8.4*  MG  --  2.1    Liver Function Tests: Recent Labs  Lab 01/04/22 1708 01/05/22 0242  AST 28 22  ALT 27 24  ALKPHOS 104 95  BILITOT 0.4 0.5  PROT 5.9* 5.4*  ALBUMIN 2.9* 2.7*     Radiology Studies: DG Chest 2 View  Result Date: 01/04/2022 CLINICAL DATA:  Shortness of breath.  Cough. EXAM: CHEST - 2 VIEW COMPARISON:  11/11/2021 FINDINGS: Low lung volumes. Patchy airspace disease identified suprahilar right upper lobe. Left lung clear. No pulmonary edema or pleural effusion. The cardiopericardial silhouette is within normal limits for size. The visualized bony structures of the thorax are unremarkable. IMPRESSION: Patchy right upper lobe airspace opacity suggesting pneumonia. Electronically Signed   By: Kennith Center M.D.   On: 01/04/2022 18:14   CT Chest Wo Contrast  Result Date: 01/04/2022 CLINICAL DATA:  Shortness of breath for 3 months, recent COVID positive, history of granulomatosis with polyangiitis EXAM: CT CHEST WITHOUT CONTRAST TECHNIQUE: Multidetector CT imaging of the chest was performed following the standard protocol without IV contrast. RADIATION DOSE REDUCTION: This exam was performed according to the departmental dose-optimization program which includes automated exposure control, adjustment of the mA and/or kV according to patient size and/or use of iterative reconstruction technique. COMPARISON:  12/20/2021 FINDINGS: Cardiovascular: Limited unenhanced imaging of the heart and great vessels demonstrates no pericardial effusion. Normal caliber of the thoracic aorta. Minimal atherosclerosis of the aorta and coronary vasculature. Mediastinum/Nodes: No enlarged mediastinal or axillary lymph  nodes. Thyroid gland, trachea, and esophagus demonstrate no significant findings. Lungs/Pleura: There is multifocal bilateral ground-glass airspace disease greatest in the right upper lobe. While this could reflect multifocal bronchopneumonia, pulmonary hemorrhage should be considered in a patient with a history of granulomatosis with polyangiitis. Improvement in the bandlike atelectasis and scarring at the right lung base since prior study. No effusion or pneumothorax. Central airways are patent. Mild right upper lobe bronchial wall thickening. Upper Abdomen: No acute abnormality. Musculoskeletal: No acute or destructive bony lesions. Reconstructed images demonstrate no additional findings. IMPRESSION: 1. Interval development of multifocal bilateral ground-glass  airspace disease, with greatest consolidation in the right upper lobe. While the findings could reflect multifocal bronchopneumonia, pulmonary hemorrhage should be considered in a patient with a history of granulomatosis with polyangiitis (Wegener's). 2. Mild right upper lobe bronchial wall thickening. 3.  Aortic Atherosclerosis (ICD10-I70.0). Electronically Signed   By: Sharlet Salina M.D.   On: 01/04/2022 22:09      LOS: 0 days    Joycelyn Das, MD [facility] 01/05/2022, 9:17 AM

## 2022-01-06 ENCOUNTER — Inpatient Hospital Stay (HOSPITAL_COMMUNITY): Payer: BC Managed Care – PPO

## 2022-01-06 DIAGNOSIS — N3000 Acute cystitis without hematuria: Secondary | ICD-10-CM | POA: Diagnosis not present

## 2022-01-06 LAB — BASIC METABOLIC PANEL
Anion gap: 7 (ref 5–15)
BUN: 12 mg/dL (ref 6–20)
CO2: 22 mmol/L (ref 22–32)
Calcium: 8.1 mg/dL — ABNORMAL LOW (ref 8.9–10.3)
Chloride: 109 mmol/L (ref 98–111)
Creatinine, Ser: 1.04 mg/dL — ABNORMAL HIGH (ref 0.44–1.00)
GFR, Estimated: >60 mL/min (ref >60)
Glucose, Bld: 123 mg/dL — ABNORMAL HIGH (ref 70–99)
Potassium: 3.8 mmol/L (ref 3.5–5.1)
Sodium: 138 mmol/L (ref 135–145)

## 2022-01-06 LAB — CBC
HCT: 28.4 % — ABNORMAL LOW (ref 36.0–46.0)
Hemoglobin: 8.6 g/dL — ABNORMAL LOW (ref 12.0–15.0)
MCH: 26.1 pg (ref 26.0–34.0)
MCHC: 30.3 g/dL (ref 30.0–36.0)
MCV: 86.3 fL (ref 80.0–100.0)
Platelets: 313 10*3/uL (ref 150–400)
RBC: 3.29 MIL/uL — ABNORMAL LOW (ref 3.87–5.11)
RDW: 18 % — ABNORMAL HIGH (ref 11.5–15.5)
WBC: 12.7 10*3/uL — ABNORMAL HIGH (ref 4.0–10.5)
nRBC: 0 % (ref 0.0–0.2)

## 2022-01-06 LAB — LEGIONELLA PNEUMOPHILA SEROGP 1 UR AG: L. pneumophila Serogp 1 Ur Ag: NEGATIVE

## 2022-01-06 LAB — STREP PNEUMONIAE URINARY ANTIGEN: Strep Pneumo Urinary Antigen: NEGATIVE

## 2022-01-06 LAB — MAGNESIUM: Magnesium: 2.2 mg/dL (ref 1.7–2.4)

## 2022-01-06 MED ORDER — CHLORHEXIDINE GLUCONATE CLOTH 2 % EX PADS
6.0000 | MEDICATED_PAD | Freq: Every day | CUTANEOUS | Status: DC
  Administered 2022-01-06 – 2022-01-08 (×3): 6 via TOPICAL

## 2022-01-06 NOTE — Evaluation (Signed)
Physical Therapy Evaluation / discharge ?Patient Details ?Name: [name] ?MRN: [id number] ?DOB: [birth year] ?Today's Date: 01/06/2022 ? ?History of Present Illness ? 56 yo admitted 3/15 with AMS with acute cystitis and PNA. PMhx: Wegener's granulomatosis and vasculitis, chronic pain, HTn, neuropathy, DM, CKD, hypothyroidism  ?Clinical Impression ? PT very pleasant reporting AMS since pain medication adjustment only at night and currently demonstrating appropriate conversation and cognition. Pt reports mainly using power scooter for mobility due to multiple bil foot surgeries and wounds with prolonged healing time and limited walking due to this. Pt able to walk with HHA with good stability at baseline functional level. Pt reports left shoulder pain and weakness x 1 week after waking up from sleeping. Pt with AC joint point tenderness as well as inability to actively internally rotate or flex/abduct shoulder beyond 30 degrees. Pt with possible impingement vs pathology and encouraged outpatient follow up with OT made aware. No further acute needs with pt aware and agreeable, will sign off.  ?   ? ?Recommendations for follow up therapy are one component of a multi-disciplinary discharge planning process, led by the attending physician.  Recommendations may be updated based on patient status, additional functional criteria and insurance authorization. ? ?Follow Up Recommendations Outpatient PT ? ?  ?Assistance Recommended at Discharge PRN  ?Patient can return home with the following ? Assistance with cooking/housework;A little help with bathing/dressing/bathroom ? ?  ?Equipment Recommendations None recommended by PT  ?Recommendations for Other Services ?    ?  ?Functional Status Assessment Patient has not had a recent decline in their functional status  ? ?  ?Precautions / Restrictions Precautions ?Precautions: Other (comment) ?Precaution Comments: bil transmet  ? ?  ? ?Mobility ? Bed Mobility ?Overal bed mobility:  Modified Independent ?  ?  ?  ?  ?  ?  ?  ?  ? ?Transfers ?Overall transfer level: Modified independent ?  ?  ?  ?  ?  ?  ?  ?  ?  ?  ? ?Ambulation/Gait ?Ambulation/Gait assistance: Min assist ?Gait Distance (Feet): 200 Feet ?Assistive device: 1 person hand held assist ?Gait Pattern/deviations: Step-through pattern, Decreased stride length ?  ?Gait velocity interpretation: >2.62 ft/sec, indicative of community ambulatory ?  ?General Gait Details: pt with min A of single UE support without cane present. Pt with good balance and stability despite bil transmet. Pt walked in hall without SOB at baseline functional level ? ?Stairs ?  ?  ?  ?  ?  ? ?Wheelchair Mobility ?  ? ?Modified Rankin (Stroke Patients Only) ?  ? ?  ? ?Balance Overall balance assessment: Mild deficits observed, not formally tested ?  ?  ?  ?  ?  ?  ?  ?  ?  ?  ?  ?  ?  ?  ?  ?  ?  ?  ?   ? ? ? ?Pertinent Vitals/Pain Pain Assessment ?Pain Assessment: No/denies pain  ? ? ?Home Living Family/patient expects to be discharged to:: Private residence ?Living Arrangements: Spouse/significant other ?Available Help at Discharge: Family;Available 24 hours/day ?Type of Home: House ?Home Access: Stairs to enter ?Entrance Stairs-Rails: None ?Entrance Stairs-Number of Steps: 2 ?  ?Home Layout: One level ?Home Equipment: Engineer, maintenance (IT) (2 wheels);BSC/3in1;Shower seat;Cane - single point;Other (comment) ?Additional Comments: adjustable bed  ?  ?Prior Function   ?  ?  ?  ?  ?  ?  ?Mobility Comments: walks short distances with cane or furniture walking and scooter  for longer distances ?ADLs Comments: ADLs can be done on her own but spouse helps with bathing particularly for last week with left shoulder weakness, spouse does the homemaking ?  ? ? ?Hand Dominance  ?   ? ?  ?Extremity/Trunk Assessment  ? Upper Extremity Assessment ?Upper Extremity Assessment: LUE deficits/detail ?LUE Deficits / Details: AROm shoulder flexion and Abduction grossly 30  degrees, PROM WFL, Pt unable to internally rotate shoulder and pain with empty and full can test as well as AC joint point tenderness ?  ? ?Lower Extremity Assessment ?Lower Extremity Assessment: Overall WFL for tasks assessed (bil transmet) ?  ? ?Cervical / Trunk Assessment ?Cervical / Trunk Assessment: Normal  ?Communication  ? Communication: No difficulties  ?Cognition Arousal/Alertness: Awake/alert ?Behavior During Therapy: [facility] for tasks assessed/performed ?Overall Cognitive Status: Within Functional Limits for tasks assessed ?  ?  ?  ?  ?  ?  ?  ?  ?  ?  ?  ?  ?  ?  ?  ?  ?  ?  ?  ? ?  ?General Comments   ? ?  ?Exercises    ? ?Assessment/Plan  ?  ?PT Assessment All further PT needs can be met in the next venue of care  ?PT Problem List Decreased range of motion ? ?   ?  ?PT Treatment Interventions     ? ?PT Goals (Current goals can be found in the Care Plan section)  ?Acute Rehab PT Goals ?PT Goal Formulation: All assessment and education complete, DC therapy ? ?  ?Frequency   ?  ? ? ?Co-evaluation   ?  ?  ?  ?  ? ? ?  ?AM-PAC PT "6 Clicks" Mobility  ?Outcome Measure Help needed turning from your back to your side while in a flat bed without using bedrails?: None ?Help needed moving from lying on your back to sitting on the side of a flat bed without using bedrails?: None ?Help needed moving to and from a bed to a chair (including a wheelchair)?: None ?Help needed standing up from a chair using your arms (e.g., wheelchair or bedside chair)?: None ?Help needed to walk in hospital room?: None ?Help needed climbing 3-5 steps with a railing? : None ?6 Click Score: 24 ? ?  ?End of Session   ?Activity Tolerance: Patient tolerated treatment well ?Patient left: in chair;with call bell/phone within reach ?Nurse Communication: Mobility status ?PT Visit Diagnosis: Muscle weakness (generalized) (M62.81) ?  ? ?Time: 1751-0258 ?PT Time Calculation (min) (ACUTE ONLY): 34 min ? ? ?Charges:   PT Evaluation ?$PT Eval Moderate  Complexity: 1 Mod ?  ?  ?   ? ? ?Cameran Pettey P, PT ?Acute Rehabilitation Services ?Pager: [phone] ?Office: [phone] ? ? ?Phillips Goulette B Claryce Friel ?01/06/2022, 1:49 PM ? ?

## 2022-01-06 NOTE — Progress Notes (Signed)
?PROGRESS NOTE ? ? ? ?[name]  [id number] DOB: Oct 22, 1966 DOA: 01/04/2022 ?PCP: Chesley Noon, MD ? ? 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  Patient is on prednisone and methotrexate and Bactrim for her Wegener's disease. She has been recently seeing [facility] in [place] and has a bronchoscopy set up for this coming Monday.  Patient has been having some altered mental status for a few months. patient was seen by [facility] in early February 2023.  At that time her intrathecal pain pump which contains Prialt was increased by 10%.  Her oxycodone was also stopped and she was restarted on Nucynta 75 mg in the morning, 75 mg in the afternoon, 150 mg at bedtime.Since that medication change, the husband has noticed the patient been increasingly somnolent at night after she goes to bed.  ?  ?In the ED, patient was febrile.  WBC at 19.6.  Marland KitchenUA showed positive nitrates, positive leukocyte esterase, greater than 50 white cells, many bacteria.  Chest x-ray demonstrated some patchy right upper lobe airspace disease.  High-resolution CT from 12/20/2021 showed postinflammatory versus postinfectious volume loss in the right middle lobe and right lower lobe with elevation hemidiaphragm.  There is also groundglass opacity in the right apex. Due to the patient's constellation of symptoms, [facility] hospitalist contacted for admission.   ?  ? ?Assessment & Plan: ?  ?Acute cystitis without hematuria ?-UA abnormal.   ?-On Vanco and cefepime.   ?-She is afebrile.  Leukocytosis improved from 7.2-12.7 ?- Follow blood culture, urine culture ?  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 ?-Has an appointment  with [facility] as outpatient for bronchoscopy on Monday ?-Continue antibiotic with vancomycin and cefepime., supportive care.   ?  ?Presence of intrathecal pump ?-Intrathecal pump dose was increased by 15% on the recent clinical visit in February 2023.  OxyContin was discontinued and patient was placed her back on Nucynta.  Now with increased lethargy and hallucination. decrease Nucynta dose to 100 mg at night..  ?- Patient to follow-up with [facility]. ?  ?Dysfunction of left rotator cuff ?-Likely left rotator cuff dysfunction vs tear.   ?-Patient continues to have difficulty raising her left arm.  X-ray of left shoulder obtained which resulted negative for any acute findings. ?-PT/OT consulted. ?-Will need outpatient orthopedic follow-up. ?  ?Stage 3a chronic kidney disease ([place]) ?-Continue to monitor BMP. ?  ?Secondary adrenal insufficiency (HCC) ?-Continue prednisone 5 mg daily for Wegener's granulomatosis. ?  ?Chemotherapy-induced peripheral neuropathy ([place]) ?-Chronic.  Patient on Nucynta, gabapentin, Cymbalta, Zanaflex, Elavil, Topamax, trazodone. ?  ?Essential hypertension ?-Continue propanolol. ?  ?Nasal septal perforation ?-underwent surgery at [facility] ?  ?Gastroesophageal reflux disease without esophagitis ?-Continue Protonix. ? ?Normocytic anemia: H&H dropped from 10.7-8.6 ?-Later H&H closely. ? ?DVT prophylaxis: Lovenox ?Code Status: Full code ?Family Communication:  None present at bedside.  Plan of care discussed with patient in length and she verbalized understanding and agreed with it. ?Disposition Plan: Likely home ? ?Consultants:  ?None ? ?Procedures:  ?None ? ?Antimicrobials:  ?Vancomycin ?Cefepime ?Azithromycin ? ?Status  is: Inpatient ? ? ? ? ?Subjective: ?Patient seen and examined.  She reports that she feels same as compared to yesterday.  She continues to have cough however denies dysuria.  No fever overnight.  She is aware that she has an appointment with pulmonology for bronchoscopy on Monday. ?She is complaining of left shoulder pain since 1 week, unable to raise left arm.  Concerned about rotator cuff issues and requested for MRI.  No recent trauma or fall. ? ?Objective: ?Vitals:  ? 01/05/22 1412 01/05/22 2112 01/06/22 0421 01/06/22 0905  ?BP: 109/76 113/66 102/62 100/67  ?Pulse: 88 62 69 79  ?Resp: 18 16    ?Temp: 98.6 ?F (37 ?C) 98.2 ?F (36.8 ?C)    ?TempSrc: Oral Oral    ?SpO2: 95% 99% 96%   ?Weight:      ?Height:      ? ? ?Intake/Output Summary (Last 24 hours) at 01/06/2022 1302 ?Last data filed at 01/06/2022 0344 ?Gross per 24 hour  ?Intake 840 ml  ?Output --  ?Net 840 ml  ? ?Filed Weights  ? 01/05/22 0045  ?Weight: 92.2 kg  ? ? ?Examination: ? ?General exam: Appears calm and comfortable, on room air, communicating well ?Respiratory system: Decreased breath sounds on the bases.  Diffuse coarse breath sounds ?cardiovascular system: S1 & S2 heard, RRR. No JVD, murmurs, rubs, gallops or clicks. No pedal edema. ?Gastrointestinal system: Abdomen is nondistended, soft and nontender. No organomegaly or masses felt. Normal bowel sounds heard. ?Central nervous system: Alert and oriented. No focal neurological deficits. ?Extremities: Bilateral transmetatarsal toe amputation  ?skin: No rashes, lesions or ulcers ?Psychiatry: Judgement and insight appear normal. Mood & affect appropriate.  ? ? ?Data Reviewed: I have personally reviewed following labs and imaging studies ? ?CBC: ?Recent Labs  ?Lab 01/04/22 ?1708 01/05/22 ?0242 01/06/22 ?0252  ?WBC 19.6* 17.2* 12.7*  ?NEUTROABS 11.9* 9.9*  --   ?HGB 10.7* 9.8* 8.6*  ?HCT 35.0* 32.9* 28.4*  ?MCV 85.8 86.8 86.3  ?PLT 380 351 313  ? ?Basic Metabolic Panel: ?Recent Labs  ?Lab 01/04/22 ?1708  01/05/22 ?0242 01/06/22 ?0252  ?NA 136 143 138  ?K 4.1 3.6 3.8  ?CL 106 108 109  ?CO2 18* 20* 22  ?GLUCOSE 80 101* 123*  ?BUN 14 13 12   ?CREATININE 1.10* 1.02* 1.04*  ?CALCIUM 8.6* 8.4* 8.1*  ?MG  --  2.1 2.2  ? ?GFR: ?Estimated Creatinine Clearance: 69.1 mL/min (A) (by C-G formula based on SCr of 1.04 mg/dL (H)). ?Liver Function Tests: ?Recent Labs  ?Lab 01/04/22 ?1708 01/05/22 ?0242  ?AST 28 22  ?ALT 27 24  ?ALKPHOS 104 95  ?BILITOT 0.4 0.5  ?PROT 5.9* 5.4*  ?ALBUMIN 2.9* 2.7*  ? ?No results for input(s): LIPASE, AMYLASE in the last 168 hours. ?No results for input(s): AMMONIA in the last 168 hours. ?Coagulation Profile: ?No results for input(s): INR, PROTIME in the last 168 hours. ?Cardiac Enzymes: ?No results for input(s): CKTOTAL, CKMB, CKMBINDEX, TROPONINI in the last 168 hours. ?BNP (last 3 results) ?No results for input(s): PROBNP in the last 8760 hours. ?HbA1C: ?No results for input(s): HGBA1C in the last 72 hours. ?CBG: ?No results for input(s): GLUCAP in the last 168 hours. ?Lipid Profile: ?No results for input(s): CHOL, HDL, LDLCALC, TRIG, CHOLHDL, LDLDIRECT in the last 72 hours. ?Thyroid Function Tests: ?No results for input(s): TSH, T4TOTAL, FREET4, T3FREE, THYROIDAB in the last 72 hours. ?Anemia Panel: ?No results for input(s): VITAMINB12, FOLATE, FERRITIN, TIBC, IRON, RETICCTPCT in the last 72 hours. ?  Sepsis Labs: ?Recent Labs  ?Lab 01/04/22 ?1932 01/05/22 ?0242  ?LATICACIDVEN 1.2 1.8  ? ? ?Recent Results (from the past 240 hour(s))  ?MRSA Next Gen by PCR, Nasal     Status: Abnormal  ? Collection Time: 01/04/22  7:03 PM  ? Specimen: Nasal Mucosa; Nasal Swab  ?Result Value Ref Range Status  ? MRSA by PCR Next Gen DETECTED (A) NOT DETECTED Final  ?  Comment: RESULT CALLED TO, READ BACK BY AND VERIFIED WITH: ? RN Donnelly Stager [phone] @2151  FH ?(NOTE) ?The GeneXpert MRSA Assay (FDA approved for NASAL specimens only), ?is one component of a comprehensive MRSA colonization surveillance ?program. It is not  intended to diagnose MRSA infection nor to guide ?or monitor treatment for MRSA infections. ?Test performance is not FDA approved in patients less than 2 years ?old. ?Performed at [facility], [place] [address] [place]

## 2022-01-07 ENCOUNTER — Other Ambulatory Visit: Payer: Self-pay

## 2022-01-07 DIAGNOSIS — N3000 Acute cystitis without hematuria: Secondary | ICD-10-CM | POA: Diagnosis not present

## 2022-01-07 LAB — CBC
HCT: 28.1 % — ABNORMAL LOW (ref 36.0–46.0)
Hemoglobin: 8.1 g/dL — ABNORMAL LOW (ref 12.0–15.0)
MCH: 25.6 pg — ABNORMAL LOW (ref 26.0–34.0)
MCHC: 28.8 g/dL — ABNORMAL LOW (ref 30.0–36.0)
MCV: 88.6 fL (ref 80.0–100.0)
Platelets: 281 10*3/uL (ref 150–400)
RBC: 3.17 MIL/uL — ABNORMAL LOW (ref 3.87–5.11)
RDW: 18 % — ABNORMAL HIGH (ref 11.5–15.5)
WBC: 9.1 10*3/uL (ref 4.0–10.5)
nRBC: 0 % (ref 0.0–0.2)

## 2022-01-07 LAB — BASIC METABOLIC PANEL
Anion gap: 8 (ref 5–15)
BUN: 12 mg/dL (ref 6–20)
CO2: 19 mmol/L — ABNORMAL LOW (ref 22–32)
Calcium: 8.1 mg/dL — ABNORMAL LOW (ref 8.9–10.3)
Chloride: 111 mmol/L (ref 98–111)
Creatinine, Ser: 1.13 mg/dL — ABNORMAL HIGH (ref 0.44–1.00)
GFR, Estimated: 57 mL/min — ABNORMAL LOW (ref >60)
Glucose, Bld: 124 mg/dL — ABNORMAL HIGH (ref 70–99)
Potassium: 3.9 mmol/L (ref 3.5–5.1)
Sodium: 138 mmol/L (ref 135–145)

## 2022-01-07 MED ORDER — MELOXICAM 7.5 MG PO TABS
7.5000 mg | ORAL_TABLET | Freq: Every day | ORAL | Status: DC
  Administered 2022-01-07: 7.5 mg via ORAL
  Filled 2022-01-07 (×2): qty 1

## 2022-01-07 MED ORDER — POLYETHYLENE GLYCOL 3350 17 G PO PACK
17.0000 g | PACK | Freq: Every day | ORAL | Status: DC
  Administered 2022-01-07 – 2022-01-08 (×2): 17 g via ORAL
  Filled 2022-01-07 (×2): qty 1

## 2022-01-07 MED ORDER — DOCUSATE SODIUM 100 MG PO CAPS
100.0000 mg | ORAL_CAPSULE | Freq: Every day | ORAL | Status: DC
  Administered 2022-01-07 – 2022-01-08 (×2): 100 mg via ORAL
  Filled 2022-01-07 (×2): qty 1

## 2022-01-07 MED ORDER — SODIUM CHLORIDE 0.9 % IV SOLN
1.0000 g | INTRAVENOUS | Status: DC
  Administered 2022-01-07: 1 g via INTRAVENOUS
  Filled 2022-01-07: qty 10

## 2022-01-07 NOTE — Progress Notes (Signed)
?PROGRESS NOTE ? ? ? ?[name]  [id number] DOB: [birth year] DOA: 01/04/2022 ?PCP: Chesley Noon, MD ? ? 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  Patient is on prednisone and methotrexate and Bactrim for her Wegener's disease. She has been recently seeing [facility] in [place] and has a bronchoscopy set up for this coming Monday.  Patient has been having some altered mental status for a few months. patient was seen by [facility] in early February 2023.  At that time her intrathecal pain pump which contains Prialt was increased by 10%.  Her oxycodone was also stopped and she was restarted on Nucynta 75 mg in the morning, 75 mg in the afternoon, 150 mg at bedtime.Since that medication change, the husband has noticed the patient been increasingly somnolent at night after she goes to bed.  ?  ?In the ED, patient was febrile.  WBC at 19.6.  Marland KitchenUA showed positive nitrates, positive leukocyte esterase, greater than 50 white cells, many bacteria.  Chest x-ray demonstrated some patchy right upper lobe airspace disease.  High-resolution CT from 12/20/2021 showed postinflammatory versus postinfectious volume loss in the right middle lobe and right lower lobe with elevation hemidiaphragm.  There is also groundglass opacity in the right apex. Due to the patient's constellation of symptoms, [facility] hospitalist contacted for admission.   ?  ? ?Assessment & Plan: ?  ?Acute cystitis without hematuria ?-UA abnormal.   ?-She is afebrile.  Leukocytosis improved from 7.2-12.7-9.1 ?-Blood culture negative till date.  Urine culture is positive for gram-negative rods  susceptibilities pending ?-On Vanco and cefepime.   ?-DC vancomycin and cefepime.  Start on Rocephin until susceptibilities are back ?  ?Right upper lobe pneumonia ?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 ([place]) ?-CT of the chest done 01/04/2022 showed interval debridement of multifocal bilateral groundglass airspace disease with greatest consolidation in the right upper lobe. ?-Has an appointment  with [facility] as outpatient for bronchoscopy on Monday ?-Finished azithromycin for 3 days.  DC azithromycin and vancomycin ?  ?Presence of intrathecal pump ?Hallucinations secondary to opioids ?-Intrathecal pump dose was increased by 15% on the recent clinical visit in February 2023.  OxyContin was discontinued and patient was placed her back on Nucynta.   ?-Had increased lethargy and hallucination. decrease Nucynta dose to 100 mg at night-her symptoms has improved now. ?- Patient to follow-up with [facility]. ?  ?Dysfunction of left rotator cuff ?-Likely left rotator cuff dysfunction vs tear.   ?-X-ray negative for any acute findings.  OT recommended outpatient OT follow-up ?-Patient is scheduled with [facility] end of this month for further evaluation and management of her left shoulder pain. ?  ?Stage 3a chronic kidney disease ([place]) ?-Creatinine slightly trended up from 1.04-1.13 ?- continue to monitor BMP. ?  ?Secondary adrenal insufficiency (HCC) ?-Continue prednisone 5 mg daily for Wegener's granulomatosis. ?  ?Chemotherapy-induced peripheral neuropathy ([place]) ?-Chronic.  Patient on Nucynta, gabapentin, Cymbalta, Zanaflex, Elavil, Topamax, trazodone. ?  ?Essential hypertension ?-Continue propanolol. ?  ?Nasal septal perforation ?-underwent surgery at [facility] ?  ?Gastroesophageal reflux disease without esophagitis ?-Continue Protonix. ? ?Normocytic anemia: H&H dropped from 10.7-8.6 ?-Monitor closely ? ?DVT prophylaxis: Lovenox ?Code Status: Full code ?Family Communication:  None  present at bedside.  Plan of care discussed with patient in length and she verbalized understanding and agreed with it. ?Discussed plan of care with patient's husband on the phone and he verbalized understanding. ?Disposition Plan: Likely home tomorrow ? ?Consultants:  ?None ? ?Procedures:  ?None ? ?Antimicrobials:  ?Vancomycin ?Cefepime ?Azithromycin ? ?Status is: Inpatient ? ? ? ? ?Subjective: ?Patient seen and examined.  Reports she feels better overall.  Breathing has improved.  Slept well last night.  No fever, chills, nausea or vomiting.  Continues to have left shoulder pain ? ?Her husband mentioned that patient has an appointment with [facility] next month. ? ?Objective: ?Vitals:  ? 01/06/22 2121 01/07/22 0414 01/07/22 0450 01/07/22 0914  ?BP: (!) 112/56 101/61  123/85  ?Pulse: 74 66  71  ?Resp: 16 18  18   ?Temp: 98.5 ?F (36.9 ?C) 98.3 ?F (36.8 ?C)  98.4 ?F (36.9 ?C)  ?TempSrc: Oral Oral  Oral  ?SpO2: 96% 91% 96% 100%  ?Weight:      ?Height:      ? ? ?Intake/Output Summary (Last 24 hours) at 01/07/2022 1441 ?Last data filed at 01/06/2022 1500 ?Gross per 24 hour  ?Intake 350 ml  ?Output --  ?Net 350 ml  ? ? ?Filed Weights  ? 01/05/22 0045  ?Weight: 92.2 kg  ? ? ?Examination: ? ?General exam: Appears calm and comfortable, on room air, communicating well, eating breakfast ?Respiratory system: Decreased breath sounds on the bases.  No wheezing, rhonchi or crackles  ?cardiovascular system: S1 & S2 heard, RRR. No JVD, murmurs, rubs, gallops or clicks. No pedal edema. ?Gastrointestinal system: Abdomen is nondistended, soft and nontender. No organomegaly or masses felt. Normal bowel sounds heard. ?Central nervous system: Alert and oriented. No focal neurological deficits. ?Extremities: Bilateral transmetatarsal toe amputation  ?skin: No rashes, lesions or ulcers ?Psychiatry: Judgement and insight appear normal. Mood & affect appropriate.  ? ? ?Data Reviewed: I have personally reviewed following labs and imaging  studies ? ?CBC: ?Recent Labs  ?Lab 01/04/22 ?1708 01/05/22 ?0242 01/06/22 ?0252 01/07/22 ?0309  ?WBC 19.6* 17.2* 12.7* 9.1  ?NEUTROABS 11.9* 9.9*  --   --   ?HGB 10.7* 9.8* 8.6* 8.1*  ?HCT 35.0* 32.9* 28.4* 28.1*  ?MCV 85.8 86.8 86.3 88.6  ?PLT 380 351 313 281  ? ? ?Basic Metabolic Panel: ?Recent Labs  ?Lab 01/04/22 ?1708 01/05/22 ?0242 01/06/22 ?0252 01/07/22 ?0309  ?NA 136 143 138 138  ?K 4.1 3.6 3.8 3.9  ?CL 106 108 109 111  ?CO2 18* 20* 22 19*  ?GLUCOSE 80 101* 123* 124*  ?BUN 14 13 12 12   ?CREATININE 1.10* 1.02* 1.04* 1.13*  ?CALCIUM 8.6* 8.4* 8.1* 8.1*  ?MG  --  2.1 2.2  --   ? ? ?GFR: ?Estimated Creatinine Clearance: 63.6 mL/min (A) (by C-G formula based on SCr of 1.13 mg/dL (H)). ?Liver Function Tests: ?Recent Labs  ?Lab 01/04/22 ?1708 01/05/22 ?0242  ?AST 28 22  ?ALT 27 24  ?ALKPHOS 104 95  ?BILITOT 0.4 0.5  ?PROT 5.9* 5.4*  ?ALBUMIN 2.9* 2.7*  ? ? ?No results for input(s): LIPASE, AMYLASE in the last 168 hours. ?No results for input(s): AMMONIA in the last 168 hours. ?Coagulation Profile: ?No results for input(s): INR, PROTIME in the last 168 hours. ?Cardiac Enzymes: ?No results for input(s): CKTOTAL, CKMB, CKMBINDEX, TROPONINI in the last 168 hours. ?BNP (last 3 results) ?No results for input(s): PROBNP in the last 8760 hours. ?HbA1C: ?No results for input(s): HGBA1C in the last 72 hours. ?CBG: ?No results for  input(s): GLUCAP in the last 168 hours. ?Lipid Profile: ?No results for input(s): CHOL, HDL, LDLCALC, TRIG, CHOLHDL, LDLDIRECT in the last 72 hours. ?Thyroid Function Tests: ?No results for input(s): TSH, T4TOTAL, FREET4, T3FREE, THYROIDAB in the last 72 hours. ?Anemia Panel: ?No results for input(s): VITAMINB12, FOLATE, FERRITIN, TIBC, IRON, RETICCTPCT in the last 72 hours. ?Sepsis Labs: ?Recent Labs  ?Lab 01/04/22 ?1932 01/05/22 ?0242  ?LATICACIDVEN 1.2 1.8  ? ? ? ?Recent Results (from the past 240 hour(s))  ?MRSA Next Gen by PCR, Nasal     Status: Abnormal  ? Collection Time: 01/04/22  7:03 PM  ?  Specimen: Nasal Mucosa; Nasal Swab  ?Result Value Ref Range Status  ? MRSA by PCR Next Gen DETECTED (A) NOT DETECTED Final  ?  Comment: RESULT CALLED TO, READ BACK BY AND VERIFIED WITH: ? RN Donnelly Stager [phone] @2151  FH ?(NOTE) ?

## 2022-01-07 NOTE — Evaluation (Signed)
Occupational Therapy Evaluation ?Patient Details ?Name: [name] ?MRN: [id number] ?DOB: 01/23/1966 ?Today's Date: 01/07/2022 ? ? ?History of Present Illness 56 yo admitted 3/15 with AMS with acute cystitis and PNA. PMhx: Wegener's granulomatosis and vasculitis, chronic pain, HTn, neuropathy, DM, CKD, hypothyroidism  ? ?Clinical Impression ?  ?Patient lives at home with spouse and prior to onset of L shoulder pain ~1 week ago patient perform self care tasks without assistance. Patient reports spouse has been helping more due to limited L UE use. Patient with limited AROM shoulder flexion and abduction to ~30 degrees. With towel slides patient able to tolerate ~60 degrees of flexion and 45 abduction. With support at elbow patient able to bring hand to mouth to feed herself, reports she has been using pillow to prop and assist with this. Educated patient in compensatory strategies for UB self care tasks as well as gentle AAROM within pain tolerance. Encourage follow up with outpatient therapy, currently the only imaging patient has had DG of shoulder reports negative for fracture or dislocation, will likely need further imaging. Acute OT to follow.  ?   ? ?Recommendations for follow up therapy are one component of a multi-disciplinary discharge planning process, led by the attending physician.  Recommendations may be updated based on patient status, additional functional criteria and insurance authorization.  ? ?Follow Up Recommendations ? Outpatient OT  ?  ?Assistance Recommended at Discharge Intermittent Supervision/Assistance  ?Patient can return home with the following A little help with bathing/dressing/bathroom ? ?  ?Functional Status Assessment ? Patient has had a recent decline in their functional status and demonstrates the ability to make significant improvements in function in a reasonable and predictable amount of time.  ?Equipment Recommendations ? None recommended by OT  ?  ?   ?Precautions / Restrictions  Precautions ?Precautions: Other (comment) ?Precaution Comments: bil transmet ?Restrictions ?Weight Bearing Restrictions: No  ? ?  ? ?Mobility Bed Mobility ?Overal bed mobility: Modified Independent ?  ?  ?  ?  ?  ?  ?  ?  ? ? ?  ? ?  ?Balance Overall balance assessment: Mild deficits observed, not formally tested ?  ?  ?  ?  ?  ?  ?  ?  ?  ?  ?  ?  ?  ?  ?  ?  ?  ?  ?   ? ?ADL either performed or assessed with clinical judgement  ? ?ADL Overall ADL's : Needs assistance/impaired ?Eating/Feeding: Set up;Sitting;Bed level ?Eating/Feeding Details (indicate cue type and reason): Patient is able to bring hand to mouth with support at elbow. Patient reports she has been placing pillow under L elbow in order to eat and putting food close in front of her ?Grooming: Set up;Sitting;Bed level ?  ?Upper Body Bathing: Minimal assistance;Sitting ?Upper Body Bathing Details (indicate cue type and reason): Instructed patient in dangle method for UB bathing due to limited shoulder function ?Lower Body Bathing: Min guard;Sit to/from stand ?  ?Upper Body Dressing : Minimal assistance;Sitting;Standing ?Upper Body Dressing Details (indicate cue type and reason): Instruct patient in dangle method as well as dressing L UE first and undressing last to increase ease of dressing. Patient verbalize understanding ?Lower Body Dressing: Min guard;Sit to/from stand;Sitting/lateral leans ?  ?Toilet Transfer: Min guard;Ambulation ?Toilet Transfer Details (indicate cue type and reason): Patient was able to power up to standing and ambulate along side of the bed at min G level for safety ?Toileting- Clothing Manipulation and Hygiene: Min guard;Sitting/lateral lean;Sit  to/from stand ?  ?  ?  ?Functional mobility during ADLs: Min guard ?General ADL Comments: Patient instructed in compensatory strategies in order to maximize independence with self care tasks.  ? ? ? ? ?Pertinent Vitals/Pain Pain Assessment ?Pain Assessment: Faces ?Faces Pain Scale: Hurts  even more ?Pain Location: L shoulder with mobility ?Pain Descriptors / Indicators: Sore, Guarding, Grimacing ?Pain Intervention(s): Monitored during session, Limited activity within patient's tolerance  ? ? ? ?Hand Dominance Left ?  ?Extremity/Trunk Assessment Upper Extremity Assessment ?Upper Extremity Assessment: LUE deficits/detail ?LUE Deficits / Details: AROM shoulder flexion and abduction ~30 degrees, with AAROM patient can tolerate ~60 degrees of shoulder flexion and 45 abduction (towel slides) elbow flexion/extension grossly intact. Patient reporting most pain at front of shoulder AC joint ?LUE: Unable to fully assess due to pain ?  ?Lower Extremity Assessment ?Lower Extremity Assessment: Defer to PT evaluation ?  ?Cervical / Trunk Assessment ?Cervical / Trunk Assessment: Normal ?  ?Communication Communication ?Communication: No difficulties ?  ?Cognition Arousal/Alertness: Awake/alert ?Behavior During Therapy: [facility] for tasks assessed/performed ?Overall Cognitive Status: Within Functional Limits for tasks assessed ?  ?  ?  ?  ?  ?  ?  ?  ?  ?  ?  ?  ?  ?  ?  ?  ?  ?  ?  ?   ?Exercises Exercises: Other exercises ?Other Exercises ?Other Exercises: Instructed patient in gentle AAROM for L shoulder and to perform within pain tolerance. Also encourage patient to continue AROM at elbow, wrist and hand to prevent UE stiffness ?  ?   ? ? ?Home Living Family/patient expects to be discharged to:: Private residence ?Living Arrangements: Spouse/significant other ?Available Help at Discharge: Family;Available 24 hours/day ?Type of Home: House ?Home Access: Stairs to enter ?Entrance Stairs-Number of Steps: 2 ?Entrance Stairs-Rails: None ?Home Layout: One level ?  ?  ?Bathroom Shower/Tub: Tub/shower unit ?  ?Bathroom Toilet: Handicapped height ?  ?  ?Home Equipment: Insurance risk surveyor (2 wheels);BSC/3in1;Shower seat;Cane - single point;Other (comment) ?  ?Additional Comments: adjustable bed ?  ? ?  ?Prior  Functioning/Environment Prior Level of Function : Independent/Modified Independent ?  ?  ?  ?  ?  ?  ?Mobility Comments: walks short distances with cane or furniture walking and scooter for longer distances ?ADLs Comments: ADLs can be done on her own but spouse helps with bathing particularly for last week with left shoulder weakness, spouse does the homemaking ?  ? ?  ?  ?OT Problem List: Pain;Impaired UE functional use ?  ?   ?OT Treatment/Interventions: Self-care/ADL training;DME and/or AE instruction;Therapeutic activities;Patient/family education  ?  ?OT Goals(Current goals can be found in the care plan section) Acute Rehab OT Goals ?Patient Stated Goal: Be able to use L arm ?OT Goal Formulation: With patient ?Time For Goal Achievement: 01/21/22 ?Potential to Achieve Goals: Good  ?OT Frequency: Min 2X/week ?  ? ?   ?AM-PAC OT "6 Clicks" Daily Activity     ?Outcome Measure Help from another person eating meals?: A Little ?Help from another person taking care of personal grooming?: A Little ?Help from another person toileting, which includes using toliet, bedpan, or urinal?: A Little ?Help from another person bathing (including washing, rinsing, drying)?: A Little ?Help from another person to put on and taking off regular upper body clothing?: A Little ?Help from another person to put on and taking off regular lower body clothing?: A Little ?6 Click Score: 18 ?  ?End of Session  Nurse Communication: Mobility status ? ?Activity Tolerance: Patient tolerated treatment well ?Patient left: in bed;with call bell/phone within reach ? ?OT Visit Diagnosis: Pain ?Pain - Right/Left: Left ?Pain - part of body: Shoulder  ?              ?Time: [id number] ?OT Time Calculation (min): 25 min ?Charges:  OT General Charges ?$OT Visit: 1 Visit ?OT Evaluation ?$OT Eval Low Complexity: 1 Low ?OT Treatments ?$Self Care/Home Management : 8-22 mins ? ?Marlyce HugeEmily Lenin Kuhnle OT ?OT pager: [phone] ? ?Carmelia RollerEmily K Jameica Couts ?01/07/2022, 2:23 PM ?

## 2022-01-08 DIAGNOSIS — N3000 Acute cystitis without hematuria: Secondary | ICD-10-CM | POA: Diagnosis not present

## 2022-01-08 LAB — BASIC METABOLIC PANEL
Anion gap: 9 (ref 5–15)
BUN: 10 mg/dL (ref 6–20)
CO2: 20 mmol/L — ABNORMAL LOW (ref 22–32)
Calcium: 8.6 mg/dL — ABNORMAL LOW (ref 8.9–10.3)
Chloride: 111 mmol/L (ref 98–111)
Creatinine, Ser: 0.93 mg/dL (ref 0.44–1.00)
GFR, Estimated: >60 mL/min (ref >60)
Glucose, Bld: 109 mg/dL — ABNORMAL HIGH (ref 70–99)
Potassium: 4.1 mmol/L (ref 3.5–5.1)
Sodium: 140 mmol/L (ref 135–145)

## 2022-01-08 LAB — CBC
HCT: 30.4 % — ABNORMAL LOW (ref 36.0–46.0)
Hemoglobin: 9.1 g/dL — ABNORMAL LOW (ref 12.0–15.0)
MCH: 25.9 pg — ABNORMAL LOW (ref 26.0–34.0)
MCHC: 29.9 g/dL — ABNORMAL LOW (ref 30.0–36.0)
MCV: 86.4 fL (ref 80.0–100.0)
Platelets: 287 10*3/uL (ref 150–400)
RBC: 3.52 MIL/uL — ABNORMAL LOW (ref 3.87–5.11)
RDW: 18.1 % — ABNORMAL HIGH (ref 11.5–15.5)
WBC: 8.7 10*3/uL (ref 4.0–10.5)
nRBC: 0 % (ref 0.0–0.2)

## 2022-01-08 LAB — URINE CULTURE: Culture: >=100000 — AB

## 2022-01-08 MED ORDER — NITROFURANTOIN MONOHYD MACRO 100 MG PO CAPS: 100.0000 mg | ORAL_CAPSULE | Freq: Two times a day (BID) | ORAL | 0 refills | Status: AC

## 2022-01-08 NOTE — Discharge Summary (Signed)
Physician Discharge Summary  ?[name] VZD:[id number] DOB: 06/25/1966 DOA: 01/04/2022 ? ?PCP: Chesley Noon, MD ? ?Admit date: 01/04/2022 ?Discharge date: 01/08/2022 ? ?Admitted From: home ?Disposition:  home ? ?Recommendations for Outpatient Follow-up:  ?Follow up with PCP in 1-2 weeks ?Please obtain BMP/CBC in one week ?Take Macrobid 100 mg twice a day for 2 more days for your UTI ?Follow-up with your pulmonology tomorrow as a scheduled for bronchoscopy ?Continue your current medications ?Follow-up with [facility] for left shoulder pain on 01/17/2022 ? ?Home Health: Yes ?Equipment/Devices: None ?Discharge Condition: Stable ?CODE STATUS: Full code ?Diet recommendation: Low-sodium diet ? ?Brief/Interim Summary: ? 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  Patient is on prednisone and methotrexate and Bactrim for her Wegener's disease. She has been recently seeing [facility] in [place] and has a bronchoscopy set up for this coming Monday.  Patient has been having some altered mental status for a few months. patient was seen by [facility] in early February 2023.  At that time her intrathecal pain pump which contains Prialt was increased by 10%.  Her oxycodone was also stopped and she was restarted on Nucynta 75 mg in the morning, 75 mg in the afternoon, 150 mg at bedtime.Since that medication change, the husband has noticed the patient been increasingly somnolent at night after she goes to bed.  ?  ?In the ED, patient was febrile.  WBC at 19.6.  Marland KitchenUA showed positive nitrates, positive leukocyte esterase, greater than 50 white cells, many bacteria.  Chest x-ray demonstrated some  patchy right upper lobe airspace disease.  High-resolution CT from 12/20/2021 showed postinflammatory versus postinfectious volume loss in the right middle lobe and right lower lobe with elevation hemidiaphragm.  There is also groundglass opacity in the right apex. Due to the patient's constellation of symptoms, [facility] hospitalist contacted for admission.   ? ?E. coli UTI: ?-UA abnormal.   ?-She is afebrile.  Leukocytosis improved from 7.2-12.7-9.1 ?-Patient started on vancomycin and cefepime in ED.  Later cefepime was switched to Rocephin on 3/18. ?-Blood culture negative till date.  Urine culture is positive for E. coli sensitive to Macrobid.  ?-Patient received total 3 dose of IV antibiotics while in the hospital. ?-Urine culture susceptibilities are back we will discharge patient on Macrobid 100 mg twice a day for 2 more days to finish total 5 days of antibiotics. ? ?Right upper lobe pneumonia ?-CT of the chest done 01/04/2022 showed interval debridement of multifocal bilateral groundglass airspace disease with greatest consolidation in the right upper lobe. ?-Has an appointment  with [facility] as outpatient for bronchoscopy on Monday 01/09/2022 at 10 AM ?-Completed azithromycin for 3 days.   ?-Remained on room air. ?  ?Presence of intrathecal pump ?Hallucinations secondary to opioids ?-Intrathecal pump dose was increased by 15% on the recent clinical visit in February 2023.  OxyContin was discontinued and patient was placed her back on Nucynta.   ?-Had increased lethargy and hallucination.  ?-decreased Nucynta dose to 100 mg at night-her symptoms has improved now. ?-Advised patient's husband to take Nucynta 75 mg twice daily for the pain at home until follow-up with [facility] clinic this week. ?  ?Dysfunction of left rotator cuff ?-Likely left rotator cuff dysfunction vs tear.   ?-X-ray  negative for any acute findings.  OT recommended outpatient OT follow-up ?-Patient is scheduled with [facility] on  01/17/2022 for further evaluation and management of her left shoulder pain. ?-PT recommended no outpatient PT.  OT recommended outpatient OT.  TOC consulted to arrange outpatient OT for the patient ?  ?Stage 3a chronic kidney disease ([place]) ?-Creatinine 0.93, GFR more than 60 on the day of discharge ?  ?Secondary adrenal insufficiency ([facility]) ?-Continue prednisone 5 mg daily for Wegener's granulomatosis. ?  ?Chemotherapy-induced peripheral neuropathy ([place]) ?-Chronic.  Patient on Nucynta, gabapentin, Cymbalta, Zanaflex, Elavil, Topamax, trazodone. ?  ?Essential hypertension ?-Remained stable.  Continued propanolol. ?  ?Nasal septal perforation ?-underwent surgery at [facility] ?  ?Gastroesophageal reflux disease without esophagitis ?-Continued Protonix. ?  ?Normocytic anemia:  ?-H&H between 9-10.  No signs of active bleeding ? ?I called patient's husband and discussed plan of care and he verbalized understanding. ?  ?Discharge Diagnoses:  ?E. 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 ? ?Discharge Instructions ? ?Discharge Instructions   ? ? Increase activity slowly   Complete by: As directed ?  ? ?  ? ?Allergies as of 01/08/2022   ? ?   Reactions  ? Ibuprofen Other (See Comments)  ? Pt is unable to take this due to kidney problems.    ? Penicillins Rash, Other (See Comments)  ? Has patient had a PCN reaction causing immediate rash, facial/tongue/throat swelling, SOB or lightheadedness with hypotension: No ?Has patient had a PCN reaction causing severe rash involving mucus membranes or skin necrosis: No ?Has patient had a PCN reaction that required hospitalization No ?Has patient had a PCN reaction  occurring within the last 10 years: No ?If all of the above answers are "NO", then may proceed with Cephalosporin use.  ? ?  ? ?  ?Medication List  ?  ? ?STOP taking these medications   ? ?furosemide 40 MG tablet ?Commonly known as: LASIX ?  ?oxyCODONE 5 MG immediate release tablet ?Commonly known as: Oxy IR/ROXICODONE ?  ?oxyCODONE-acetaminophen 5-325 MG tablet ?Commonly known as: PERCOCET/ROXICET ?  ? ?  ? ?TAKE these medications   ? ?acetaminophen 325 MG tablet ?Commonly known as: TYLENOL ?Take 2 tablets (650 mg total) by mouth every 6 (six) hours as needed for mild pain (or Fever >/= 101). ?  ?albuterol 108 (90 Base) MCG/ACT inhaler ?Commonly known as: VENTOLIN HFA ?Inhale 2 puffs into the lungs every 6 (six) hours as needed for wheezing or shortness of breath. ?  ?amitriptyline 50 MG tablet ?Commonly known as: ELAVIL ?Take 50 mg by mouth at bedtime. ?  ?CALCIUM CARBONATE-VITAMIN D3 PO ?Take 1 tablet by mouth daily. ?  ?DULoxetine 60 MG capsule ?Commonly known as: CYMBALTA ?Take 60 mg by mouth daily. ?  ?folic acid 1 MG tablet ?Commonly known as: FOLVITE ?Take 2 mg by mouth daily. ?  ?gabapentin 300 MG capsule ?Commonly known as: NEURONTIN ?Take 600-1,200 mg by mouth See admin instructions. Take 3 (900 mg total) capsules by mouth in the morning, 2 (600 mg) capsule in the afternoon and 4 (1200 mg) capsules in the evening. ?  ?K-DUR PO ?Take 40 mEq by mouth every other day. ?  ?lidocaine 5 % ?Commonly known as: LIDODERM ?Place 1 patch onto the skin daily as needed (for pain).  Remove & Discard patch within 12 hours or as directed by MD ?  ?linaclotide 145 MCG Caps capsule ?Commonly known as: LINZESS ?Take 145 mcg by mouth daily before breakfast. ?  ?Melatonin 10 MG Tabs ?Take 10 mg by mouth at bedtime as needed (sleep). ?  ?METHOTREXATE SODIUM IJ ?Inject 25 mg into the skin every Sunday. ?  ?naloxone 4 MG/0.1ML Liqd nasal spray kit ?Commonly known as: NARCAN ?Place 0.4 mg into the nose once. ?  ?nitrofurantoin  (macrocrystal-monohydrate) 100 MG capsule ?Commonly known as: Macrobid ?Take 1 capsule (100 mg total) by mouth 2 (two) times daily for 2 days. ?  ?pantoprazole 40 MG tablet ?Commonly known as: PROTONIX ?Take 40 mg by mo

## 2022-01-09 LAB — CULTURE, BLOOD (ROUTINE X 2)
Culture: NO GROWTH
Culture: NO GROWTH
Special Requests: ADEQUATE

## 2022-02-06 ENCOUNTER — Other Ambulatory Visit (HOSPITAL_COMMUNITY): Payer: Self-pay | Admitting: Physician Assistant

## 2022-02-06 DIAGNOSIS — M25571 Pain in right ankle and joints of right foot: Secondary | ICD-10-CM

## 2022-02-07 ENCOUNTER — Ambulatory Visit (HOSPITAL_COMMUNITY)
Admission: RE | Admit: 2022-02-07 | Discharge: 2022-02-07 | Disposition: A | Discharge status: Home / Self Care | Payer: BC Managed Care – PPO | Source: Ambulatory Visit | Attending: Physician Assistant | Admitting: Physician Assistant

## 2022-02-07 DIAGNOSIS — M25571 Pain in right ankle and joints of right foot: Secondary | ICD-10-CM

## 2022-02-19 ENCOUNTER — Emergency Department (HOSPITAL_COMMUNITY): Payer: BC Managed Care – PPO

## 2022-02-19 ENCOUNTER — Inpatient Hospital Stay (HOSPITAL_COMMUNITY)
Admission: EM | Admit: 2022-02-19 | Discharge: 2022-02-23 | DRG: 556 | Disposition: A | Discharge status: Home Health | Payer: BC Managed Care – PPO | Attending: Internal Medicine | Admitting: Internal Medicine

## 2022-02-19 ENCOUNTER — Observation Stay (HOSPITAL_COMMUNITY): Payer: BC Managed Care – PPO

## 2022-02-19 ENCOUNTER — Encounter (HOSPITAL_COMMUNITY): Payer: Self-pay

## 2022-02-19 ENCOUNTER — Other Ambulatory Visit: Payer: Self-pay

## 2022-02-19 DIAGNOSIS — E1142 Type 2 diabetes mellitus with diabetic polyneuropathy: Secondary | ICD-10-CM | POA: Diagnosis present

## 2022-02-19 DIAGNOSIS — Z89422 Acquired absence of other left toe(s): Secondary | ICD-10-CM

## 2022-02-19 DIAGNOSIS — I10 Essential (primary) hypertension: Secondary | ICD-10-CM | POA: Diagnosis present

## 2022-02-19 DIAGNOSIS — E039 Hypothyroidism, unspecified: Secondary | ICD-10-CM | POA: Diagnosis present

## 2022-02-19 DIAGNOSIS — B9562 Methicillin resistant Staphylococcus aureus infection as the cause of diseases classified elsewhere: Secondary | ICD-10-CM | POA: Diagnosis present

## 2022-02-19 DIAGNOSIS — M1611 Unilateral primary osteoarthritis, right hip: Secondary | ICD-10-CM | POA: Diagnosis not present

## 2022-02-19 DIAGNOSIS — N179 Acute kidney failure, unspecified: Secondary | ICD-10-CM | POA: Diagnosis present

## 2022-02-19 DIAGNOSIS — E669 Obesity, unspecified: Secondary | ICD-10-CM | POA: Diagnosis present

## 2022-02-19 DIAGNOSIS — G8929 Other chronic pain: Secondary | ICD-10-CM | POA: Diagnosis present

## 2022-02-19 DIAGNOSIS — Z886 Allergy status to analgesic agent status: Secondary | ICD-10-CM

## 2022-02-19 DIAGNOSIS — R053 Chronic cough: Secondary | ICD-10-CM | POA: Diagnosis present

## 2022-02-19 DIAGNOSIS — M313 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: Secondary | ICD-10-CM | POA: Diagnosis not present

## 2022-02-19 DIAGNOSIS — Z880 Allergy status to penicillin: Secondary | ICD-10-CM

## 2022-02-19 DIAGNOSIS — Z8249 Family history of ischemic heart disease and other diseases of the circulatory system: Secondary | ICD-10-CM

## 2022-02-19 DIAGNOSIS — E785 Hyperlipidemia, unspecified: Secondary | ICD-10-CM | POA: Diagnosis present

## 2022-02-19 DIAGNOSIS — M25559 Pain in unspecified hip: Secondary | ICD-10-CM | POA: Diagnosis present

## 2022-02-19 DIAGNOSIS — K219 Gastro-esophageal reflux disease without esophagitis: Secondary | ICD-10-CM | POA: Diagnosis present

## 2022-02-19 DIAGNOSIS — Z79899 Other long term (current) drug therapy: Secondary | ICD-10-CM

## 2022-02-19 DIAGNOSIS — F32A Depression, unspecified: Secondary | ICD-10-CM | POA: Diagnosis present

## 2022-02-19 DIAGNOSIS — I959 Hypotension, unspecified: Secondary | ICD-10-CM | POA: Diagnosis present

## 2022-02-19 DIAGNOSIS — M609 Myositis, unspecified: Secondary | ICD-10-CM

## 2022-02-19 DIAGNOSIS — Z6835 Body mass index (BMI) 35.0-35.9, adult: Secondary | ICD-10-CM

## 2022-02-19 DIAGNOSIS — W19XXXA Unspecified fall, initial encounter: Secondary | ICD-10-CM | POA: Diagnosis present

## 2022-02-19 DIAGNOSIS — Z7952 Long term (current) use of systemic steroids: Secondary | ICD-10-CM

## 2022-02-19 DIAGNOSIS — A419 Sepsis, unspecified organism: Principal | ICD-10-CM

## 2022-02-19 DIAGNOSIS — M87851 Other osteonecrosis, right femur: Secondary | ICD-10-CM | POA: Diagnosis present

## 2022-02-19 DIAGNOSIS — Z20822 Contact with and (suspected) exposure to covid-19: Secondary | ICD-10-CM | POA: Diagnosis present

## 2022-02-19 DIAGNOSIS — Z89421 Acquired absence of other right toe(s): Secondary | ICD-10-CM

## 2022-02-19 DIAGNOSIS — M25551 Pain in right hip: Secondary | ICD-10-CM | POA: Diagnosis not present

## 2022-02-19 DIAGNOSIS — M6088 Other myositis, other site: Principal | ICD-10-CM | POA: Diagnosis present

## 2022-02-19 DIAGNOSIS — S76011A Strain of muscle, fascia and tendon of right hip, initial encounter: Secondary | ICD-10-CM | POA: Diagnosis present

## 2022-02-19 DIAGNOSIS — Z833 Family history of diabetes mellitus: Secondary | ICD-10-CM

## 2022-02-19 LAB — URINALYSIS, ROUTINE W REFLEX MICROSCOPIC
Bilirubin Urine: NEGATIVE
Glucose, UA: NEGATIVE mg/dL
Ketones, ur: NEGATIVE mg/dL
Leukocytes,Ua: NEGATIVE
Nitrite: NEGATIVE
Protein, ur: NEGATIVE mg/dL
Specific Gravity, Urine: 1.005 (ref 1.005–1.030)
pH: 6 (ref 5.0–8.0)

## 2022-02-19 LAB — COMPREHENSIVE METABOLIC PANEL
ALT: 34 U/L (ref 0–44)
AST: 85 U/L — ABNORMAL HIGH (ref 15–41)
Albumin: 3.2 g/dL — ABNORMAL LOW (ref 3.5–5.0)
Alkaline Phosphatase: 108 U/L (ref 38–126)
Anion gap: 11 (ref 5–15)
BUN: 22 mg/dL — ABNORMAL HIGH (ref 6–20)
CO2: 22 mmol/L (ref 22–32)
Calcium: 8.3 mg/dL — ABNORMAL LOW (ref 8.9–10.3)
Chloride: 102 mmol/L (ref 98–111)
Creatinine, Ser: 1.39 mg/dL — ABNORMAL HIGH (ref 0.44–1.00)
GFR, Estimated: 45 mL/min — ABNORMAL LOW (ref >60)
Glucose, Bld: 146 mg/dL — ABNORMAL HIGH (ref 70–99)
Potassium: 4 mmol/L (ref 3.5–5.1)
Sodium: 135 mmol/L (ref 135–145)
Total Bilirubin: 0.5 mg/dL (ref 0.3–1.2)
Total Protein: 6.4 g/dL — ABNORMAL LOW (ref 6.5–8.1)

## 2022-02-19 LAB — APTT: aPTT: 26 seconds (ref 24–36)

## 2022-02-19 LAB — CBC WITH DIFFERENTIAL/PLATELET
Abs Immature Granulocytes: 0.36 10*3/uL — ABNORMAL HIGH (ref 0.00–0.07)
Basophils Absolute: 0.1 10*3/uL (ref 0.0–0.1)
Basophils Relative: 0 %
Eosinophils Absolute: 0.1 10*3/uL (ref 0.0–0.5)
Eosinophils Relative: 1 %
HCT: 34.1 % — ABNORMAL LOW (ref 36.0–46.0)
Hemoglobin: 10.1 g/dL — ABNORMAL LOW (ref 12.0–15.0)
Immature Granulocytes: 2 %
Lymphocytes Relative: 17 %
Lymphs Abs: 3.7 10*3/uL (ref 0.7–4.0)
MCH: 24.6 pg — ABNORMAL LOW (ref 26.0–34.0)
MCHC: 29.6 g/dL — ABNORMAL LOW (ref 30.0–36.0)
MCV: 83.2 fL (ref 80.0–100.0)
Monocytes Absolute: 0.9 10*3/uL (ref 0.1–1.0)
Monocytes Relative: 4 %
Neutro Abs: 16.7 10*3/uL — ABNORMAL HIGH (ref 1.7–7.7)
Neutrophils Relative %: 76 %
Platelets: 355 10*3/uL (ref 150–400)
RBC: 4.1 MIL/uL (ref 3.87–5.11)
RDW: 18.1 % — ABNORMAL HIGH (ref 11.5–15.5)
WBC: 21.8 10*3/uL — ABNORMAL HIGH (ref 4.0–10.5)
nRBC: 0 % (ref 0.0–0.2)

## 2022-02-19 LAB — RESP PANEL BY RT-PCR (FLU A&B, COVID) ARPGX2
Influenza A by PCR: NEGATIVE
Influenza B by PCR: NEGATIVE
SARS Coronavirus 2 by RT PCR: NEGATIVE

## 2022-02-19 LAB — MRSA NEXT GEN BY PCR, NASAL: MRSA by PCR Next Gen: DETECTED — AB

## 2022-02-19 LAB — GLUCOSE, CAPILLARY: Glucose-Capillary: 266 mg/dL — ABNORMAL HIGH (ref 70–99)

## 2022-02-19 LAB — POC URINE PREG, ED: Preg Test, Ur: NEGATIVE

## 2022-02-19 LAB — PROTIME-INR
INR: 1 (ref 0.8–1.2)
Prothrombin Time: 13.4 seconds (ref 11.4–15.2)

## 2022-02-19 LAB — LACTIC ACID, PLASMA
Lactic Acid, Venous: 2.2 mmol/L (ref 0.5–1.9)
Lactic Acid, Venous: 1.2 mmol/L (ref 0.5–1.9)

## 2022-02-19 MED ORDER — TOPIRAMATE 100 MG PO TABS
100.0000 mg | ORAL_TABLET | Freq: Every day | ORAL | Status: DC
  Administered 2022-02-20 – 2022-02-23 (×4): 100 mg via ORAL
  Filled 2022-02-19 (×5): qty 1

## 2022-02-19 MED ORDER — ENOXAPARIN SODIUM 40 MG/0.4ML IJ SOSY
40.0000 mg | PREFILLED_SYRINGE | INTRAMUSCULAR | Status: DC
  Administered 2022-02-19 – 2022-02-22 (×4): 40 mg via SUBCUTANEOUS
  Filled 2022-02-19 (×4): qty 0.4

## 2022-02-19 MED ORDER — HYDROCORTISONE SOD SUC (PF) 100 MG IJ SOLR
100.0000 mg | Freq: Once | INTRAMUSCULAR | Status: AC
  Administered 2022-02-19: 100 mg via INTRAVENOUS
  Filled 2022-02-19: qty 2

## 2022-02-19 MED ORDER — VITAMIN B-12 1000 MCG PO TABS
1000.0000 ug | ORAL_TABLET | Freq: Every day | ORAL | Status: DC
  Administered 2022-02-20 – 2022-02-23 (×4): 1000 ug via ORAL
  Filled 2022-02-19 (×5): qty 1

## 2022-02-19 MED ORDER — GABAPENTIN 300 MG PO CAPS
600.0000 mg | ORAL_CAPSULE | Freq: Every day | ORAL | Status: DC
  Administered 2022-02-20 – 2022-02-22 (×3): 600 mg via ORAL
  Filled 2022-02-19 (×3): qty 2

## 2022-02-19 MED ORDER — GABAPENTIN 300 MG PO CAPS
900.0000 mg | ORAL_CAPSULE | Freq: Every day | ORAL | Status: DC
  Administered 2022-02-20 – 2022-02-23 (×4): 900 mg via ORAL
  Filled 2022-02-19 (×4): qty 3

## 2022-02-19 MED ORDER — MUPIROCIN 2 % EX OINT
1.0000 "application " | TOPICAL_OINTMENT | Freq: Two times a day (BID) | CUTANEOUS | Status: DC
  Administered 2022-02-19 – 2022-02-23 (×8): 1 via NASAL
  Filled 2022-02-19 (×2): qty 22

## 2022-02-19 MED ORDER — SODIUM CHLORIDE 0.9 % IV BOLUS
2000.0000 mL | Freq: Once | INTRAVENOUS | Status: AC
Start: 2022-02-19 — End: 2022-02-19
  Administered 2022-02-19: 2000 mL via INTRAVENOUS

## 2022-02-19 MED ORDER — FENTANYL CITRATE PF 50 MCG/ML IJ SOSY
50.0000 ug | PREFILLED_SYRINGE | Freq: Once | INTRAMUSCULAR | Status: AC
  Administered 2022-02-19: 50 ug via INTRAVENOUS
  Filled 2022-02-19: qty 1

## 2022-02-19 MED ORDER — VANCOMYCIN HCL IN DEXTROSE 1-5 GM/200ML-% IV SOLN: 1000.0000 mg | Freq: Once | INTRAVENOUS | Status: DC

## 2022-02-19 MED ORDER — HYDROCORTISONE SOD SUC (PF) 100 MG IJ SOLR
75.0000 mg | Freq: Two times a day (BID) | INTRAMUSCULAR | Status: DC
  Administered 2022-02-19 – 2022-02-22 (×5): 75 mg via INTRAVENOUS
  Filled 2022-02-19 (×5): qty 2

## 2022-02-19 MED ORDER — TAPENTADOL HCL ER 50 MG PO TB12: 150.0000 mg | ORAL_TABLET | Freq: Two times a day (BID) | ORAL | Status: DC

## 2022-02-19 MED ORDER — ONDANSETRON HCL 4 MG/2ML IJ SOLN: 4.0000 mg | Freq: Four times a day (QID) | INTRAMUSCULAR | Status: DC | PRN

## 2022-02-19 MED ORDER — GABAPENTIN 400 MG PO CAPS
1200.0000 mg | ORAL_CAPSULE | Freq: Every day | ORAL | Status: DC
  Administered 2022-02-19 – 2022-02-22 (×4): 1200 mg via ORAL
  Filled 2022-02-19 (×4): qty 3

## 2022-02-19 MED ORDER — ONDANSETRON HCL 4 MG PO TABS: 4.0000 mg | ORAL_TABLET | Freq: Four times a day (QID) | ORAL | Status: DC | PRN

## 2022-02-19 MED ORDER — PREDNISONE 10 MG PO TABS: 5.0000 mg | ORAL_TABLET | Freq: Every day | ORAL | Status: DC

## 2022-02-19 MED ORDER — TIZANIDINE HCL 2 MG PO TABS
2.0000 mg | ORAL_TABLET | Freq: Every day | ORAL | Status: DC
  Administered 2022-02-19 – 2022-02-22 (×4): 2 mg via ORAL
  Filled 2022-02-19 (×4): qty 1

## 2022-02-19 MED ORDER — PANTOPRAZOLE SODIUM 40 MG PO TBEC
40.0000 mg | DELAYED_RELEASE_TABLET | Freq: Every day | ORAL | Status: DC
Start: 2022-02-20 — End: 2022-02-23
  Administered 2022-02-20 – 2022-02-23 (×4): 40 mg via ORAL
  Filled 2022-02-19 (×5): qty 1

## 2022-02-19 MED ORDER — DULOXETINE HCL 60 MG PO CPEP
60.0000 mg | ORAL_CAPSULE | Freq: Every day | ORAL | Status: DC
  Administered 2022-02-20 – 2022-02-23 (×4): 60 mg via ORAL
  Filled 2022-02-19 (×5): qty 1

## 2022-02-19 MED ORDER — ALBUTEROL SULFATE (2.5 MG/3ML) 0.083% IN NEBU: 3.0000 mL | INHALATION_SOLUTION | Freq: Four times a day (QID) | RESPIRATORY_TRACT | Status: DC | PRN

## 2022-02-19 MED ORDER — TAPENTADOL HCL 75 MG PO TABS: 75.0000 mg | ORAL_TABLET | Freq: Three times a day (TID) | ORAL | Status: DC

## 2022-02-19 MED ORDER — SODIUM CHLORIDE 0.9 % IV SOLN
2.0000 g | Freq: Two times a day (BID) | INTRAVENOUS | Status: DC
  Administered 2022-02-20: 2 g via INTRAVENOUS
  Filled 2022-02-19: qty 12.5

## 2022-02-19 MED ORDER — PROPRANOLOL HCL 20 MG PO TABS: 40.0000 mg | ORAL_TABLET | Freq: Every day | ORAL | Status: DC

## 2022-02-19 MED ORDER — HYDROCORTISONE SOD SUC (PF) 100 MG IJ SOLR: 50.0000 mg | Freq: Three times a day (TID) | INTRAMUSCULAR | Status: DC

## 2022-02-19 MED ORDER — AMITRIPTYLINE HCL 25 MG PO TABS
50.0000 mg | ORAL_TABLET | Freq: Every day | ORAL | Status: DC
  Administered 2022-02-19 – 2022-02-22 (×4): 50 mg via ORAL
  Filled 2022-02-19 (×4): qty 2

## 2022-02-19 MED ORDER — ACETAMINOPHEN 325 MG PO TABS
650.0000 mg | ORAL_TABLET | Freq: Four times a day (QID) | ORAL | Status: DC | PRN
  Administered 2022-02-20: 650 mg via ORAL
  Filled 2022-02-19: qty 2

## 2022-02-19 MED ORDER — VANCOMYCIN HCL 1250 MG/250ML IV SOLN: 1250.0000 mg | INTRAVENOUS | Status: DC

## 2022-02-19 MED ORDER — TRAZODONE HCL 50 MG PO TABS
100.0000 mg | ORAL_TABLET | Freq: Every day | ORAL | Status: DC
  Administered 2022-02-19 – 2022-02-22 (×4): 100 mg via ORAL
  Filled 2022-02-19 (×4): qty 2

## 2022-02-19 MED ORDER — ACETAMINOPHEN 325 MG PO TABS
650.0000 mg | ORAL_TABLET | Freq: Once | ORAL | Status: AC
  Administered 2022-02-19: 650 mg via ORAL
  Filled 2022-02-19: qty 2

## 2022-02-19 MED ORDER — HYDROMORPHONE HCL 1 MG/ML IJ SOLN
0.5000 mg | Freq: Once | INTRAMUSCULAR | Status: AC
  Administered 2022-02-19: 0.5 mg via INTRAVENOUS
  Filled 2022-02-19: qty 0.5

## 2022-02-19 MED ORDER — SENNA 8.6 MG PO TABS
2.0000 | ORAL_TABLET | Freq: Two times a day (BID) | ORAL | Status: DC
  Administered 2022-02-19 – 2022-02-23 (×8): 17.2 mg via ORAL
  Filled 2022-02-19 (×9): qty 2

## 2022-02-19 MED ORDER — ACETAMINOPHEN 325 MG PO TABS: 650.0000 mg | ORAL_TABLET | Freq: Four times a day (QID) | ORAL | Status: DC | PRN

## 2022-02-19 MED ORDER — SODIUM CHLORIDE 0.9 % IV SOLN
INTRAVENOUS | Status: DC
Start: 2022-02-19 — End: 2022-02-20

## 2022-02-19 MED ORDER — GABAPENTIN 300 MG PO CAPS: 600.0000 mg | ORAL_CAPSULE | ORAL | Status: DC

## 2022-02-19 MED ORDER — SULFAMETHOXAZOLE-TRIMETHOPRIM 400-80 MG PO TABS
1.0000 | ORAL_TABLET | ORAL | Status: DC
  Administered 2022-02-20 – 2022-02-22 (×2): 1 via ORAL
  Filled 2022-02-19 (×3): qty 1

## 2022-02-19 MED ORDER — TAPENTADOL HCL ER 50 MG PO TB12
150.0000 mg | ORAL_TABLET | Freq: Two times a day (BID) | ORAL | Status: DC
  Administered 2022-02-19 – 2022-02-23 (×8): 150 mg via ORAL
  Filled 2022-02-19 (×9): qty 3

## 2022-02-19 MED ORDER — TAPENTADOL HCL 75 MG PO TABS: 75.0000 mg | ORAL_TABLET | Freq: Two times a day (BID) | ORAL | Status: DC

## 2022-02-19 MED ORDER — ACETAMINOPHEN 650 MG RE SUPP: 650.0000 mg | Freq: Four times a day (QID) | RECTAL | Status: DC | PRN

## 2022-02-19 MED ORDER — SODIUM CHLORIDE 0.9 % IV SOLN: 2.0000 g | Freq: Once | INTRAVENOUS | Status: DC

## 2022-02-19 MED ORDER — LINACLOTIDE 145 MCG PO CAPS
145.0000 ug | ORAL_CAPSULE | Freq: Every day | ORAL | Status: DC
  Administered 2022-02-20 – 2022-02-23 (×4): 145 ug via ORAL
  Filled 2022-02-19 (×5): qty 1

## 2022-02-19 MED ORDER — CHLORHEXIDINE GLUCONATE CLOTH 2 % EX PADS
6.0000 | MEDICATED_PAD | Freq: Every day | CUTANEOUS | Status: DC
  Administered 2022-02-21 – 2022-02-23 (×3): 6 via TOPICAL

## 2022-02-19 MED ORDER — MELATONIN 3 MG PO TABS
9.0000 mg | ORAL_TABLET | Freq: Every evening | ORAL | Status: DC | PRN
  Administered 2022-02-19: 9 mg via ORAL
  Filled 2022-02-19: qty 3

## 2022-02-19 MED ORDER — ROSUVASTATIN CALCIUM 20 MG PO TABS
10.0000 mg | ORAL_TABLET | Freq: Every day | ORAL | Status: DC
  Administered 2022-02-19 – 2022-02-21 (×3): 10 mg via ORAL
  Filled 2022-02-19 (×3): qty 1

## 2022-02-19 MED ORDER — TAPENTADOL HCL 75 MG PO TABS: 150.0000 mg | ORAL_TABLET | Freq: Every day | ORAL | Status: DC

## 2022-02-19 MED ORDER — METRONIDAZOLE 500 MG/100ML IV SOLN
500.0000 mg | Freq: Once | INTRAVENOUS | Status: AC
  Administered 2022-02-19: 500 mg via INTRAVENOUS
  Filled 2022-02-19: qty 100

## 2022-02-19 MED ORDER — NALOXONE HCL 4 MG/0.1ML NA LIQD: 0.4000 mg | Freq: Once | NASAL | Status: DC

## 2022-02-19 MED ORDER — VANCOMYCIN HCL 1500 MG/300ML IV SOLN
1500.0000 mg | Freq: Once | INTRAVENOUS | Status: AC
  Administered 2022-02-19: 1500 mg via INTRAVENOUS
  Filled 2022-02-19: qty 300

## 2022-02-19 MED ORDER — SODIUM CHLORIDE 0.9 % IV SOLN
2.0000 g | Freq: Once | INTRAVENOUS | Status: AC
  Administered 2022-02-19: 2 g via INTRAVENOUS
  Filled 2022-02-19: qty 12.5

## 2022-02-19 MED ORDER — FOLIC ACID 1 MG PO TABS
2.0000 mg | ORAL_TABLET | Freq: Every day | ORAL | Status: DC
  Administered 2022-02-20 – 2022-02-23 (×4): 2 mg via ORAL
  Filled 2022-02-19 (×5): qty 2

## 2022-02-19 NOTE — Assessment & Plan Note (Addendum)
Patient had rituximab infusion last week. ?Continue close monitoring.  ?Ruling out systemic infection in the setting of immunosuppression.  ? ?Continue prophylactic bactrim three times per week.  ?

## 2022-02-19 NOTE — Assessment & Plan Note (Signed)
Continue with amitriptyline and duloxetine.  ?Continue trazodone and topiramate.  ?

## 2022-02-19 NOTE — ED Notes (Signed)
EDPA into room, at BS.  

## 2022-02-19 NOTE — Assessment & Plan Note (Addendum)
Hold on propranolol for now due to risk of worsening hypotension.  ? ?Dyslipidemia. Continue with statin therapy.  ?

## 2022-02-19 NOTE — Assessment & Plan Note (Signed)
Continue with antiacid therapy.  ?

## 2022-02-19 NOTE — ED Notes (Signed)
Xray finished at [facility] EDPA at [facility].  ?

## 2022-02-19 NOTE — ED Notes (Signed)
Family at [facility]. Xray at [facility]. Pt alert, NAD, calm, interactive.  ?

## 2022-02-19 NOTE — Progress Notes (Signed)
Date and time results received: 02/19/22 2244 ? ?Test: MRSA PCR ?Critical Value: Positive ? ?Name of Provider Notified: Mauricio Annett Gula, MD ? ?Orders Received? Or Actions Taken?: Orders Received - See Orders for details ?

## 2022-02-19 NOTE — Assessment & Plan Note (Signed)
Calculated BMI is 35,04.  ?

## 2022-02-19 NOTE — ED Triage Notes (Signed)
Patient states that she woke this am with right hip pain. States that she can hardly walk and denies any injury.  ?

## 2022-02-19 NOTE — H&P (Signed)
?History and Physical  ? ? ?Patient: Halston Fairclough YSA:[id number] DOB: 05-Mar-1966 ?DOA: 02/19/2022 ?DOS: the patient was seen and examined on 02/19/2022 ?PCP: Eartha Inch, MD  ?Patient coming from: Home ? ?Chief Complaint:  ?Chief Complaint  ?Patient presents with  ? Hip Pain  ? ?HPI: Nonna Renninger is a 56 y.o. female with medical history significant of Wegener's granulomatosis who presented with severe and acute right hip pain. Patient reported bing at her baseline until this am when she was not able to get out of the bed due to severe right hip pain. Worse with movement and no improving factors, associated with subjective fever but no diaphoresis. Because of severe symptoms she came to the hospital for further evaluation.  ?Her Wegener's granulomatosis has been uncontrolled with worsening upper airway symptoms, she follows with [facility] and has been restarted on Rituximab first dose on 02/16/22.  ?About 1 month ago she had right shoulder pain, that also occurred after waking up, it slowly resolved by itself, and was associated with right upper extremity paresthesias.  ?   ?She has bilateral metatarsal amputation.  ?On further questioning she also has right ankle pain but no right knee pain.  ?Poor oral intake for the last 2 to 3 weeks due to upper respiratory symptoms.  ? ?Review of Systems: As mentioned in the history of present illness. All other systems reviewed and are negative. ?Past Medical History:  ?Diagnosis Date  ? Chronic cough   ? Chronic pain   ? Diabetes mellitus without complication (HCC)   ? Dyspnea   ? GERD (gastroesophageal reflux disease)   ? Hypokalemia   ? Hypothyroidism   ? Left wrist pain 06/01/2021  ? Myonecrosis (HCC) 07/01/2021  ? Neurocardiogenic syncope   ? OSA (obstructive sleep apnea)   ? does not use CPAP  ? Osteomyelitis (HCC)   ? bilateral feet  ? Peripheral neuropathy   ? Presence of intrathecal pump   ? recieves Prialt/bupivicaine  ? Tear of gluteus medius  tendon 07/01/2021  ? Wegener's granulomatosis 2009  ? Wegner's disease (congenital syphilitic osteochondritis)   ? ?Past Surgical History:  ?Procedure Laterality Date  ? AMPUTATION Right 10/21/2020  ? Procedure: Right hallux amputation;  Surgeon: Toni Arthurs, MD;  Location: [facility];  Service: Orthopedics;  Laterality: Right;  ? AMPUTATION TOE Bilateral 08/12/2020  ? Procedure: Right 3rd toe amputation; left hallux and 3rd toe amputation;  Surgeon: Toni Arthurs, MD;  Location: [facility];  Service: Orthopedics;  Laterality: Bilateral;   ? AMPUTATION TOE Right 03/24/2021  ? Procedure: AMPUTATION TOE;  Surgeon: Toni Arthurs, MD;  Location: [facility];  Service: Orthopedics;  Laterality: Right;  ? APPENDECTOMY    ? BACK SURGERY    ? CHOLECYSTECTOMY    ? COLONOSCOPY N/A 06/02/2013  ? Procedure: COLONOSCOPY;  Surgeon: Graylin Shiver, MD;  Location: [facility];  Service: Endoscopy;  Laterality: N/A;  ? HERNIA REPAIR  2000  ? INCISION AND DRAINAGE ABSCESS Left 07/07/2016  ? Procedure: DEBRIDMENT LEFT THIGH ABSCESS, EXISION ACUTE SKIN RASH LEFT THIGH(1CM LESION);  Surgeon: Claud Kelp, MD;  Location: [facility];  Service: General;  Laterality: Left;  ? SEPTOPLASTY  02/2013  ? spleen anuyism    ? splenic aneurysm    ? TRANSMETATARSAL AMPUTATION Bilateral 10/21/2020  ? Procedure: Left transmetatarsal amputation;  Surgeon: Toni Arthurs, MD;  Location: [facility];  Service: Orthopedics;  Laterality: Bilateral;  ? TRANSMETATARSAL AMPUTATION Left 03/24/2021  ?  Procedure: TRANSMETATARSAL AMPUTATION;  Surgeon: Toni Arthurs, MD;  Location: [facility] OR;  Service: Orthopedics;  Laterality: Left;  ? VAGINAL HYSTERECTOMY    ? ?Social History:  reports that she has never smoked. She has never used smokeless tobacco. She reports current alcohol use. She reports that she does not use drugs. ? ?Allergies  ?Allergen Reactions  ? Ibuprofen Other (See Comments)  ?  Pt is unable to take this due to kidney  problems.   ?  ? Penicillins Rash and Other (See Comments)  ?  Has patient had a PCN reaction causing immediate rash, facial/tongue/throat swelling, SOB or lightheadedness with hypotension: No ?Has patient had a PCN reaction causing severe rash involving mucus membranes or skin necrosis: No ?Has patient had a PCN reaction that required hospitalization No ?Has patient had a PCN reaction occurring within the last 10 years: No ?If all of the above answers are "NO", then may proceed with Cephalosporin use.  ? ? ?Family History  ?Problem Relation Age of Onset  ? Diabetes Mother   ? Heart disease Mother   ? Diabetes Father   ? ? ?Prior to Admission medications   ?Medication Sig Start Date End Date Taking? Authorizing Provider  ?acetaminophen (TYLENOL) 325 MG tablet Take 2 tablets (650 mg total) by mouth every 6 (six) hours as needed for mild pain (or Fever >/= 101). 07/12/16  Yes Joseph Art, DO  ?albuterol (PROVENTIL HFA;VENTOLIN HFA) 108 (90 Base) MCG/ACT inhaler Inhale 2 puffs into the lungs every 6 (six) hours as needed for wheezing or shortness of breath.   Yes [provider]  ?amitriptyline (ELAVIL) 50 MG tablet Take 50 mg by mouth at bedtime. 11/04/21  Yes [provider]  ?Calcium Carb-Cholecalciferol (CALCIUM CARBONATE-VITAMIN D3 PO) Take 1 tablet by mouth daily.   Yes [provider]  ?DULoxetine (CYMBALTA) 60 MG capsule Take 60 mg by mouth daily.   Yes [provider]  ?folic acid (FOLVITE) 1 MG tablet Take 2 mg by mouth daily.   Yes [provider]  ?gabapentin (NEURONTIN) 300 MG capsule Take 600-1,200 mg by mouth See admin instructions. Take 3 (900 mg total) capsules by mouth in the morning, 2 (600 mg) capsule in the afternoon and 4 (1200 mg) capsules in the evening.   Yes [provider]  ?lidocaine (LIDODERM) 5 % Place 1 patch onto the skin daily as needed (for pain). Remove & Discard patch within 12 hours or as directed by MD   Yes [provider]  ?linaclotide (LINZESS) 145 MCG CAPS capsule Take 145 mcg by mouth daily before breakfast. 09/21/21  Yes [provider]  ?Melatonin 10 MG TABS Take 10 mg by mouth at bedtime as needed (sleep).   Yes [provider]  ?METHOTREXATE SODIUM IJ Inject 25 mg into the skin every Sunday.   Yes [provider]  ?naloxone (NARCAN) nasal spray 4 mg/0.1 mL Place 0.4 mg into the nose once. 12/11/19  Yes [provider]  ?pantoprazole (PROTONIX) 40 MG tablet Take 40 mg by mouth daily. 07/01/21  Yes [provider]  ?Potassium Chloride Crys ER (K-DUR PO) Take 40 mEq by mouth every other day.   Yes [provider]  ?predniSONE (DELTASONE) 20 MG tablet Take 1 tablet (20 mg total) by mouth 2 (two) times daily. ?Patient taking differently: Take 5 mg by mouth daily. 05/11/21  Yes Mancel Bale, MD  ?propranolol (INDERAL) 40 MG tablet Take 40 mg by mouth daily. 07/21/21  Yes  [provider]  ?rosuvastatin (CRESTOR) 10 MG tablet Take 10 mg by mouth at bedtime. 10/25/21  Yes [provider]  ?senna (SENOKOT) 8.6 MG TABS tablet Take 2 tablets (17.2 mg total) by mouth 2 (two) times daily. 03/25/21  Yes Jacinta Shoellis, Justin Pike, PA-C  ?sulfamethoxazole-trimethoprim (BACTRIM) 400-80 MG tablet Take 1 tablet by mouth 3 (three) times a week. Monday,Wednesday and Friday 07/13/21  Yes [provider]  ?tapentadol HCl (NUCYNTA) 75 MG tablet Take 75 mg by mouth. Take 1 tablet in the morning, 1 tablet in the afternoon and 2 tablets at bedtime   Yes [provider]  ?tizanidine (ZANAFLEX) 2 MG capsule Take 2 mg by mouth at bedtime. 05/10/17  Yes [provider]  ?topiramate (TOPAMAX) 100 MG tablet Take 100 mg by mouth daily. 01/14/18  Yes [provider]  ?traZODone (DESYREL) 100 MG tablet Take 100 mg by mouth at bedtime. 01/12/20  Yes [place], Peter M, MD  ?vitamin B-12 (CYANOCOBALAMIN) 1000 MCG tablet Take 1,000 mcg by mouth daily.   Yes  [provider]  ?Vitamin D, Ergocalciferol, (DRISDOL) 50000 units CAPS capsule Take 50,000 Units by mouth every Friday.   Yes [provider]  ?promethazine-dextromethorphan (PROMETHAZINE-DM) 6.25-15 MG/5ML

## 2022-02-19 NOTE — ED Notes (Signed)
Report called to [place], RN on 300 ?

## 2022-02-19 NOTE — ED Notes (Signed)
Pt placed on purewick 

## 2022-02-19 NOTE — Assessment & Plan Note (Signed)
Continue with levothyroxine  

## 2022-02-19 NOTE — Assessment & Plan Note (Addendum)
-  AKI-resolved ?Likely pre -renal associated with poor oral intake and ?Less likely sepsis.Marland Kitchen  ?Sepsis was ruled out ?Continue isotonic IV fluids with normal saline at 100 ml per hr ?Follow up renal function in am, avoid hypotension and nephrotoxic medications.  ?

## 2022-02-19 NOTE — Assessment & Plan Note (Addendum)
-  On admission patient was hypotensive, had fever-in the face of immunocompromised--IV antibiotics was initiated ?Septic arthritis has been ruled out ? ? ?Plan to continue antibiotic therapy with vancomycin and cefepime. ?Continue fluids resuscitation with isotonic saline at 100 ml per hr.  ?Stress dose steroids with hydrocortisone 50 mg q8 hrs.  ?Follow up on blood cultures ... If negative by 48-72 hours antibiotics will be discontinued ? ?Right hip [id number]: ?High-grade partial right gluteus minimus tendon tear at the greater ?trochanter with likely muscle tear component and significant ?intramuscular edema. Low-grade partial right gluteus medius tendon ?tear at the greater trochanter. Reactive intramuscular edema in the ?proximal right vastus lateralis. ?? 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 No articular surface collapse. ?? ?Mild right hip osteoarthritis with degenerative anterior superior ?labral tearing and posterosuperior labral fraying. ? ?-Orthopedic Dr. Aline Brochure consulted, discussed in detail ?He is concerned for myonecrosis in the setting of diabetes and history of Wegener's disease -immunocompromise ?-Continue PT, as needed analgesics, biotics for now ? ?

## 2022-02-19 NOTE — ED Provider Notes (Signed)
?[facility] ?Provider Note ? ? ?CSN: [id number] ?Arrival date & time: 02/19/22  1249 ? ?  ? ?History ?Chief Complaint  ?Patient presents with  ? Hip Pain  ? ? ?[name] is a 56 y.o. 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  This started this morning.  She states pain is excruciating.  Worse with any movement.  Patient denies any injury.  She also states that she has been waking up with night sweats which is not a new problem going on for some time.  She has also been battling pneumonia since January.  Last finished antibiotics roughly 1 to 2 weeks ago.  Patient still having productive cough with green sputum.  She denies abdominal pain, nausea, vomiting, diarrhea, urinary complaints.  Left hip does not hurt. ? ? ?Hip Pain ? ? ?  ? ?Home Medications ?Prior to Admission medications   ?Medication Sig Start Date End Date Taking? Authorizing Provider  ?acetaminophen (TYLENOL) 325 MG tablet Take 2 tablets (650 mg total) by mouth every 6 (six) hours as needed for mild pain (or Fever >/= 101). 07/12/16  Yes Joseph Art, DO  ?albuterol (PROVENTIL HFA;VENTOLIN HFA) 108 (90 Base) MCG/ACT inhaler Inhale 2 puffs into the lungs every 6 (six) hours as needed for wheezing or shortness of breath.   Yes [provider]  ?amitriptyline (ELAVIL) 50 MG tablet Take 50 mg by mouth at bedtime. 11/04/21  Yes [provider]  ?Calcium Carb-Cholecalciferol (CALCIUM CARBONATE-VITAMIN D3 PO) Take 1 tablet by mouth daily.   Yes [provider]  ?DULoxetine (CYMBALTA) 60 MG capsule Take 60 mg by mouth daily.   Yes [provider]  ?folic acid (FOLVITE) 1 MG tablet Take 2 mg by mouth daily.   Yes [provider]  ?gabapentin (NEURONTIN) 300 MG  capsule Take 600-1,200 mg by mouth See admin instructions. Take 3 (900 mg total) capsules by mouth in the morning, 2 (600 mg) capsule in the afternoon and 4 (1200 mg) capsules in the evening.   Yes [provider]  ?lidocaine (LIDODERM) 5 % Place 1 patch onto the skin daily as needed (for pain). Remove & Discard patch within 12 hours or as directed by MD   Yes [provider]  ?linaclotide (LINZESS) 145 MCG CAPS capsule Take 145 mcg by mouth daily before breakfast. 09/21/21  Yes [provider]  ?Melatonin 10 MG TABS Take 10 mg by mouth at bedtime as needed (sleep).   Yes [provider]  ?METHOTREXATE SODIUM IJ Inject 25 mg into the skin every Sunday.   Yes [provider]  ?naloxone (NARCAN) nasal spray 4 mg/0.1 mL Place 0.4 mg into the nose once. 12/11/19  Yes [provider]  ?pantoprazole (PROTONIX) 40 MG tablet Take 40 mg by mouth daily. 07/01/21  Yes [provider]  ?Potassium Chloride Crys ER (K-DUR PO) Take 40 mEq by mouth every other day.   Yes [provider]  ?predniSONE (DELTASONE) 20 MG tablet Take 1 tablet (20 mg total) by mouth 2 (two) times daily. ?Patient taking differently: Take 5 mg by mouth daily. 05/11/21  Yes Mancel Bale, MD  ?propranolol (INDERAL) 40 MG tablet Take 40 mg by mouth daily. 07/21/21  Yes [provider]  ?rosuvastatin (CRESTOR) 10 MG tablet Take 10 mg by  mouth at bedtime. 10/25/21  Yes [provider]  ?senna (SENOKOT) 8.6 MG TABS tablet Take 2 tablets (17.2 mg total) by mouth 2 (two) times daily. 03/25/21  Yes Jacinta Shoellis, Justin Pike, PA-C  ?sulfamethoxazole-trimethoprim (BACTRIM) 400-80 MG tablet Take 1 tablet by mouth 3 (three) times a week. Monday,Wednesday and Friday 07/13/21  Yes [provider]  ?tapentadol HCl (NUCYNTA) 75 MG tablet Take 75 mg by mouth. Take 1 tablet in the morning, 1 tablet in the afternoon and 2 tablets at bedtime   Yes [provider]  ?tizanidine  (ZANAFLEX) 2 MG capsule Take 2 mg by mouth at bedtime. 05/10/17  Yes [provider]  ?topiramate (TOPAMAX) 100 MG tablet Take 100 mg by mouth daily. 01/14/18  Yes [provider]  ?traZODone (DESYREL) 100 MG tablet Take 100 mg by mouth at bedtime. 01/12/20  Yes [place], Peter M, MD  ?vitamin B-12 (CYANOCOBALAMIN) 1000 MCG tablet Take 1,000 mcg by mouth daily.   Yes [provider]  ?Vitamin D, Ergocalciferol, (DRISDOL) 50000 units CAPS capsule Take 50,000 Units by mouth every Friday.   Yes [provider]  ?promethazine-dextromethorphan (PROMETHAZINE-DM) 6.25-15 MG/5ML syrup Take 5-10 mLs by mouth at bedtime as needed for cough. ?Patient not taking: Reported on 01/04/2022 11/07/21   [provider]  ?   ? ?Allergies    ?Ibuprofen and Penicillins   ? ?Review of Systems   ?Review of Systems  ?All other systems reviewed and are negative. ? ?Physical Exam ?Updated Vital Signs ?BP 111/77   Pulse 75   Temp 99.6 ?F (37.6 ?C) (Oral)   Resp 18   Ht 5\' 6"  (1.676 m)   Wt 88.5 kg   SpO2 97%   BMI 31.47 kg/m?  ?Physical Exam ?Vitals and nursing note reviewed.  ?Constitutional:   ?   General: She is not in acute distress. ?   Appearance: Normal appearance.  ?HENT:  ?   Head: Normocephalic and atraumatic.  ?Eyes:  ?   General:     ?   Right eye: No discharge.     ?   Left eye: No discharge.  ?Cardiovascular:  ?   Comments: Regular rate and rhythm.  S1/S2 are distinct without any evidence of murmur, rubs, or gallops.  Radial pulses are 2+ bilaterally.  Dorsalis pedis pulses are 2+ bilaterally.  No evidence of pedal edema. ?Pulmonary:  ?   Comments: Clear to auscultation bilaterally.  Normal effort.  No respiratory distress.  No evidence of wheezes, rales, or rhonchi heard throughout. ?Abdominal:  ?   General: Abdomen is flat. Bowel sounds are normal. There is no distension.  ?   Tenderness: There is no abdominal tenderness. There is no guarding or rebound.  ?Musculoskeletal:  ?    Cervical back: Neck supple.  ?   Comments: Right hip is tender to palpation.  Limited range of motion hip secondary to pain.  Left hip has normal range of motion and is nontender to palpation.  Patient has all 5 metatarsals amputated on both feet.  There is a small wound that does not appear purulent and without surrounding erythema on the right foot.  ?Skin: ?   General: Skin is warm and dry.  ?   Findings: No rash.  ?   Comments: There is no evidence of rash, erythema, or warmth over the right hip.  No evidence of skin breakdown or ulceration.  ?Neurological:  ?   General: No focal deficit present.  ?   Mental  Status: She is alert.  ?Psychiatric:     ?   Mood and Affect: Mood normal.     ?   Behavior: Behavior normal.  ? ? ?ED Results / Procedures / Treatments   ?Labs ?(all labs ordered are listed, but only abnormal results are displayed) ?Labs Reviewed  ?LACTIC ACID, PLASMA - Abnormal; Notable for the following components:  ?    Result Value  ? Lactic Acid, Venous 2.2 (*)   ? All other components within normal limits  ?COMPREHENSIVE METABOLIC PANEL - Abnormal; Notable for the following components:  ? Glucose, Bld 146 (*)   ? BUN 22 (*)   ? Creatinine, Ser 1.39 (*)   ? Calcium 8.3 (*)   ? Total Protein 6.4 (*)   ? Albumin 3.2 (*)   ? AST 85 (*)   ? GFR, Estimated 45 (*)   ? All other components within normal limits  ?CBC WITH DIFFERENTIAL/PLATELET - Abnormal; Notable for the following components:  ? WBC 21.8 (*)   ? Hemoglobin 10.1 (*)   ? HCT 34.1 (*)   ? MCH 24.6 (*)   ? MCHC 29.6 (*)   ? RDW 18.1 (*)   ? Neutro Abs 16.7 (*)   ? Abs Immature Granulocytes 0.36 (*)   ? All other components within normal limits  ?URINALYSIS, ROUTINE W REFLEX MICROSCOPIC - Abnormal; Notable for the following components:  ? Hgb urine dipstick SMALL (*)   ? Bacteria, UA RARE (*)   ? All other components within normal limits  ?RESP PANEL BY RT-PCR (FLU A&B, COVID) [id number]  ?CULTURE, BLOOD (ROUTINE X 2)  ?CULTURE, BLOOD (ROUTINE X 2)   ?LACTIC ACID, PLASMA  ?PROTIME-INR  ?APTT  ?POC URINE PREG, ED  ?POC URINE PREG, ED  ? ? ?EKG ?None ? ?Radiology ?DG Chest Port 1 View ? ?Result Date: 02/19/2022 ?CLINICAL DATA:  Awoke with RIGHT hip pain EXAM: PORTABLE CHEST

## 2022-02-19 NOTE — Sepsis Progress Note (Signed)
Elink following code sepsis °

## 2022-02-19 NOTE — Progress Notes (Signed)
Pharmacy Antibiotic Note ? ?[name] is a 56 y.o. female admitted on 02/19/2022 with  unknown source .  Pharmacy has been consulted for Vancomycin and cefepime dosing. ? ?Plan: ?Vancomycin 1500 mg IV loading dose, then 1250mg  IV  Q 24 hrs. Goal AUC 400-550. ?Expected AUC: 497 ?SCr used: 1.39  ?Cefepime 2gm IV q12h ?F/U cxs and clinical progress ?Monitor V/S, labs, and levels as indicated ? ?Height: 5\' 6"  (167.6 cm) ?Weight: 88.5 kg (195 lb) ?IBW/kg (Calculated) : 59.3 ? ?Temp (24hrs), Avg:100.4 ?F (38 ?C), Min:100.4 ?F (38 ?C), Max:100.4 ?F (38 ?C) ? ?Recent Labs  ?Lab 02/19/22 ?1306  ?WBC 21.8*  ?CREATININE 1.39*  ?LATICACIDVEN 2.2*  ?  ?Estimated Creatinine Clearance: 50.7 mL/min (A) (by C-G formula based on SCr of 1.39 mg/dL (H)).   ? ?Allergies  ?Allergen Reactions  ? Ibuprofen Other (See Comments)  ?  Pt is unable to take this due to kidney problems.   ?  ? Penicillins Rash and Other (See Comments)  ?  Has patient had a PCN reaction causing immediate rash, facial/tongue/throat swelling, SOB or lightheadedness with hypotension: No ?Has patient had a PCN reaction causing severe rash involving mucus membranes or skin necrosis: No ?Has patient had a PCN reaction that required hospitalization No ?Has patient had a PCN reaction occurring within the last 10 years: No ?If all of the above answers are "NO", then may proceed with Cephalosporin use.  ? ? ?Antimicrobials this admission: ?Vancomycin 4/30 >>  ?cefepime 4/30 >>  ?Flagyl 4/30>> ? ?Microbiology results: ?4/30 BCx: pending ?4/30 UCx: pending  ? MRSA PCR:  ? ?Thank you for allowing pharmacy to be a part of this patient?s care. ? ?5/30, BS Pharm D, BCPS ?Clinical Pharmacist ?02/19/2022 1:54 PM ? ?

## 2022-02-20 ENCOUNTER — Observation Stay (HOSPITAL_COMMUNITY): Payer: BC Managed Care – PPO

## 2022-02-20 DIAGNOSIS — M25559 Pain in unspecified hip: Secondary | ICD-10-CM | POA: Diagnosis present

## 2022-02-20 DIAGNOSIS — G8929 Other chronic pain: Secondary | ICD-10-CM | POA: Diagnosis present

## 2022-02-20 DIAGNOSIS — S76011A Strain of muscle, fascia and tendon of right hip, initial encounter: Secondary | ICD-10-CM | POA: Diagnosis present

## 2022-02-20 DIAGNOSIS — E039 Hypothyroidism, unspecified: Secondary | ICD-10-CM | POA: Diagnosis present

## 2022-02-20 DIAGNOSIS — M313 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: Secondary | ICD-10-CM | POA: Diagnosis present

## 2022-02-20 DIAGNOSIS — E669 Obesity, unspecified: Secondary | ICD-10-CM | POA: Diagnosis present

## 2022-02-20 DIAGNOSIS — N179 Acute kidney failure, unspecified: Secondary | ICD-10-CM | POA: Diagnosis present

## 2022-02-20 DIAGNOSIS — Z8249 Family history of ischemic heart disease and other diseases of the circulatory system: Secondary | ICD-10-CM | POA: Diagnosis not present

## 2022-02-20 DIAGNOSIS — Z20822 Contact with and (suspected) exposure to covid-19: Secondary | ICD-10-CM | POA: Diagnosis present

## 2022-02-20 DIAGNOSIS — I10 Essential (primary) hypertension: Secondary | ICD-10-CM | POA: Diagnosis present

## 2022-02-20 DIAGNOSIS — M87851 Other osteonecrosis, right femur: Secondary | ICD-10-CM | POA: Diagnosis present

## 2022-02-20 DIAGNOSIS — M1611 Unilateral primary osteoarthritis, right hip: Secondary | ICD-10-CM | POA: Diagnosis present

## 2022-02-20 DIAGNOSIS — E1142 Type 2 diabetes mellitus with diabetic polyneuropathy: Secondary | ICD-10-CM | POA: Diagnosis present

## 2022-02-20 DIAGNOSIS — Z89422 Acquired absence of other left toe(s): Secondary | ICD-10-CM | POA: Diagnosis not present

## 2022-02-20 DIAGNOSIS — Z6835 Body mass index (BMI) 35.0-35.9, adult: Secondary | ICD-10-CM | POA: Diagnosis not present

## 2022-02-20 DIAGNOSIS — M6088 Other myositis, other site: Secondary | ICD-10-CM | POA: Diagnosis present

## 2022-02-20 DIAGNOSIS — Z880 Allergy status to penicillin: Secondary | ICD-10-CM | POA: Diagnosis not present

## 2022-02-20 DIAGNOSIS — Z89421 Acquired absence of other right toe(s): Secondary | ICD-10-CM | POA: Diagnosis not present

## 2022-02-20 DIAGNOSIS — I959 Hypotension, unspecified: Secondary | ICD-10-CM | POA: Diagnosis present

## 2022-02-20 DIAGNOSIS — M609 Myositis, unspecified: Secondary | ICD-10-CM | POA: Diagnosis not present

## 2022-02-20 DIAGNOSIS — B9562 Methicillin resistant Staphylococcus aureus infection as the cause of diseases classified elsewhere: Secondary | ICD-10-CM | POA: Diagnosis present

## 2022-02-20 DIAGNOSIS — W19XXXA Unspecified fall, initial encounter: Secondary | ICD-10-CM | POA: Diagnosis present

## 2022-02-20 DIAGNOSIS — Z886 Allergy status to analgesic agent status: Secondary | ICD-10-CM | POA: Diagnosis not present

## 2022-02-20 DIAGNOSIS — E785 Hyperlipidemia, unspecified: Secondary | ICD-10-CM | POA: Diagnosis present

## 2022-02-20 DIAGNOSIS — K219 Gastro-esophageal reflux disease without esophagitis: Secondary | ICD-10-CM | POA: Diagnosis present

## 2022-02-20 DIAGNOSIS — M25551 Pain in right hip: Secondary | ICD-10-CM | POA: Diagnosis present

## 2022-02-20 DIAGNOSIS — Z833 Family history of diabetes mellitus: Secondary | ICD-10-CM | POA: Diagnosis not present

## 2022-02-20 DIAGNOSIS — F32A Depression, unspecified: Secondary | ICD-10-CM | POA: Diagnosis present

## 2022-02-20 LAB — BASIC METABOLIC PANEL
Anion gap: 4 — ABNORMAL LOW (ref 5–15)
BUN: 20 mg/dL (ref 6–20)
CO2: 24 mmol/L (ref 22–32)
Calcium: 7.9 mg/dL — ABNORMAL LOW (ref 8.9–10.3)
Chloride: 112 mmol/L — ABNORMAL HIGH (ref 98–111)
Creatinine, Ser: 0.89 mg/dL (ref 0.44–1.00)
GFR, Estimated: >60 mL/min (ref >60)
Glucose, Bld: 215 mg/dL — ABNORMAL HIGH (ref 70–99)
Potassium: 4.1 mmol/L (ref 3.5–5.1)
Sodium: 140 mmol/L (ref 135–145)

## 2022-02-20 LAB — CBC
HCT: 30 % — ABNORMAL LOW (ref 36.0–46.0)
Hemoglobin: 8.6 g/dL — ABNORMAL LOW (ref 12.0–15.0)
MCH: 24.2 pg — ABNORMAL LOW (ref 26.0–34.0)
MCHC: 28.7 g/dL — ABNORMAL LOW (ref 30.0–36.0)
MCV: 84.3 fL (ref 80.0–100.0)
Platelets: 296 10*3/uL (ref 150–400)
RBC: 3.56 MIL/uL — ABNORMAL LOW (ref 3.87–5.11)
RDW: 17.4 % — ABNORMAL HIGH (ref 11.5–15.5)
WBC: 15.3 10*3/uL — ABNORMAL HIGH (ref 4.0–10.5)
nRBC: 0 % (ref 0.0–0.2)

## 2022-02-20 MED ORDER — SODIUM CHLORIDE 0.9 % IV SOLN
2.0000 g | Freq: Three times a day (TID) | INTRAVENOUS | Status: DC
  Administered 2022-02-20 – 2022-02-22 (×5): 2 g via INTRAVENOUS
  Filled 2022-02-20 (×6): qty 12.5

## 2022-02-20 MED ORDER — SODIUM CHLORIDE 0.9 % IV SOLN: INTRAVENOUS | Status: AC

## 2022-02-20 MED ORDER — TRAMADOL HCL 50 MG PO TABS
50.0000 mg | ORAL_TABLET | Freq: Two times a day (BID) | ORAL | Status: DC
  Administered 2022-02-20 – 2022-02-23 (×7): 50 mg via ORAL
  Filled 2022-02-20 (×8): qty 1

## 2022-02-20 MED ORDER — OXYCODONE HCL 5 MG PO TABS
10.0000 mg | ORAL_TABLET | ORAL | Status: DC | PRN
  Administered 2022-02-20 – 2022-02-22 (×5): 10 mg via ORAL
  Filled 2022-02-20 (×5): qty 2

## 2022-02-20 NOTE — Progress Notes (Signed)
Patient ID: [name], female   DOB: [birth year], 57 y.o.   MRN: [id number] ?Outside information from [facility] last week from care everywhere: ? ?56 year old female with a history of Wegener's granulomatosis, adrenal insufficiency, anemia, chronic kidney disease, diabetes, attention, failure with hypoxia, sleep apnea, bilateral midfoot amputation presents to the ER chief complaint of fall. Patient states that she was going to get food prior to a chemotherapy treatment today. States that she stepped off of a sidewalk ledge and lost her balance. States that she fell onto her buttocks, struck the back of her head and injured her left ankle. No LOC, denies blood thinners. She is complaining of a mild headache, buttocks pain and left ankle pain.  ? ?Today MRI shows edema muscles of right hip  ? ?To me this is postraumatic edema from injury 2 weeks ago, unlikely acute tear now. More likely edema from 2 weeks ago. Whats concerning is that she s immunocompromised and a high risk for surgical intervention here at [facility]. This is an extremely high risk patient especially if there a re signs of sepsis systemically  ? ?With her history of diabetes it would not be surprising if she progressed to myonecrosis.  ? ?I would transfer her asap in case she worsens and needs surgery  ?

## 2022-02-20 NOTE — Progress Notes (Signed)
?PROGRESS NOTE ? ? ? ?Patient: [name]                            PCP: Chesley Noon, MD                    ?DOB: 1966/05/26            DOA: 02/19/2022 ?[id number]             DOS: 02/20/2022, 11:07 AM ? ? LOS: 0 days  ? ?Date of Service: The patient was seen and examined on 02/20/2022 ? ?Subjective:  ? ?The patient was seen and examined this morning. ?Stable at this time. ?Still complaining of right hip pain, with all range of motion including weightbearing ?Negative for any hematoma.  Mild edema.  ? ?Brief Narrative:  ? ? ?[name] is a 56 y.o. female with medical history significant of Wegener's granulomatosis who presented with severe and acute right hip pain. Patient reported bing at her baseline until this am when she was not able to get out of the bed due to severe right hip pain. Worse with movement and no improving factors, associated with subjective fever but no diaphoresis. Because of severe symptoms she came to the hospital for further evaluation.  ?Her Wegener's granulomatosis has been uncontrolled with worsening upper airway symptoms, she follows with [facility] and has been restarted on Rituximab first dose on 02/16/22.  ?About 1 month ago she had right shoulder pain, that also occurred after waking up, it slowly resolved by itself, and was associated with right upper extremity paresthesias.  ?   ?She has bilateral metatarsal amputation.  On further questioning she also has right ankle pain but no right knee pain.  ?Poor oral intake for the last 2 to 3 weeks due to upper respiratory symptoms.  ?  ? ? ? ?Assessment & Plan:  ? ?Principal Problem: ?  Arthritis of right hip ?Active Problems: ?  AKI (acute kidney injury) ([place]) ?  Granulomatosis with polyangiitis ([place]) ?  GERD (gastroesophageal reflux disease) ?  Hypothyroidism ?  Depression ?  Essential hypertension ?  Class 2 obesity ? ? ? ? ?Assessment and Plan: ?* Arthritis of right hip ?Patient with fever and hypotension on  admission, medical immunosuppression, need to rule out infectious process. ?Severe sepsis on admission. ? ?Plan to continue antibiotic therapy with vancomycin and cefepime. ?Continue fluids resuscitation with isotonic saline at 100 ml per hr.  ?Stress dose steroids with hydrocortisone 50 mg q8 hrs.  ?Follow up on blood cultures. ?Right hip [id number]: ?High-grade partial right gluteus minimus tendon tear at the greater ?trochanter with likely muscle tear component and significant ?intramuscular edema. Low-grade partial right gluteus medius tendon ?tear at the greater trochanter. Reactive intramuscular edema in the ?proximal right vastus lateralis. ?  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 No articular surface collapse. ?  ?Mild right hip osteoarthritis with degenerative anterior superior ?labral tearing and posterosuperior labral fraying. ? ?-Orthopedic Dr. Aline Brochure consulted, appreciate input ?Continue PT, as needed analgesics ? ? ?AKI (acute kidney injury) ([place]) ?Likely pre -renal associated with poor oral intake and ?Less likely sepsis.Marland Kitchen  ?Sepsis was ruled out ?Continue isotonic IV fluids with normal saline at 100 ml per hr ?Follow up renal function in am, avoid hypotension and nephrotoxic medications.  ? ?Granulomatosis with polyangiitis ([place]) ?Patient had rituximab infusion last week. ?Continue close monitoring.  ?Ruling  out systemic infection in the setting of immunosuppression.  ? ?Continue prophylactic bactrim three times per week.  ? ?GERD (gastroesophageal reflux disease) ?Continue with antiacid therapy.  ? ?Hypothyroidism ?Continue with levothyroxine  ? ?Depression ?Continue with amitriptyline and duloxetine.  ?Continue trazodone and topiramate.  ? ?Essential hypertension ?Hold on propranolol for now due to risk of worsening hypotension.  ? ?Dyslipidemia. Continue with statin therapy.  ? ?Class 2 obesity ?Calculated BMI is 35,04.  ? ? ? ? ?Cultures; ?Blood Cultures x 2  >> NGT ? ? ?---------------------------------------------------------------------------------------------------------------- ? ?DVT prophylaxis:  ?enoxaparin (LOVENOX) injection 40 mg Start: 02/19/22 2200 ?SCDs Start: 02/19/22 1948 ? ? ?Code Status:   Code Status: Full Code ? ?Family Communication: Discussed with her husband on the phone at the bedside ?The above findings and plan of care has been discussed with patient (and family)  in detail,  ?they expressed understanding and agreement of above. ?-Advance care planning has been discussed.  ? ?Admission status:   ?Status is: Observation ?The patient remains OBS appropriate and will d/c before 2 midnights. ? ? ? ? ?Procedures:  ? ?No admission procedures for hospital encounter. ? ? ?Antimicrobials:  ?Anti-infectives (From admission, onward)  ? ? Start     Dose/Rate Route Frequency Ordered Stop  ? 02/20/22 1500  vancomycin (VANCOREADY) IVPB 1250 mg/250 mL       ? 1,250 mg ?166.7 mL/hr over 90 Minutes Intravenous Every 24 hours 02/19/22 1402    ? 02/20/22 0900  sulfamethoxazole-trimethoprim (BACTRIM) 400-80 MG per tablet 1 tablet       ?Note to Pharmacy: Monday,Wednesday and Friday    ? 1 tablet Oral Once per day on Mon Wed Fri 02/19/22 1947    ? 02/20/22 0200  ceFEPIme (MAXIPIME) 2 g in sodium chloride 0.9 % 100 mL IVPB       ? 2 g ?200 mL/hr over 30 Minutes Intravenous Every 12 hours 02/19/22 1402    ? 02/19/22 1400  vancomycin (VANCOREADY) IVPB 1500 mg/300 mL       ? 1,500 mg ?150 mL/hr over 120 Minutes Intravenous  Once 02/19/22 1329 02/19/22 1734  ? 02/19/22 1330  aztreonam (AZACTAM) 2 g in sodium chloride 0.9 % 100 mL IVPB  Status:  Discontinued       ? 2 g ?200 mL/hr over 30 Minutes Intravenous  Once 02/19/22 1323 02/19/22 1329  ? 02/19/22 1330  metroNIDAZOLE (FLAGYL) IVPB 500 mg       ? 500 mg ?100 mL/hr over 60 Minutes Intravenous  Once 02/19/22 1323 02/19/22 1514  ? 02/19/22 1330  vancomycin (VANCOCIN) IVPB 1000 mg/200 mL premix  Status:  Discontinued        ? 1,000 mg ?200 mL/hr over 60 Minutes Intravenous  Once 02/19/22 1323 02/19/22 1329  ? 02/19/22 1330  ceFEPIme (MAXIPIME) 2 g in sodium chloride 0.9 % 100 mL IVPB       ? 2 g ?200 mL/hr over 30 Minutes Intravenous  Once 02/19/22 1329 02/19/22 1430  ? ?  ? ? ? ?Medication:  ? amitriptyline  50 mg Oral QHS  ? Chlorhexidine Gluconate Cloth  6 each Topical Daily  ? DULoxetine  60 mg Oral Daily  ? enoxaparin (LOVENOX) injection  40 mg Subcutaneous [id number]  ? folic acid  2 mg Oral Daily  ? gabapentin  900 mg Oral QAC breakfast  ? And  ? gabapentin  600 mg Oral Q1400  ? And  ? gabapentin  1,200 mg Oral QHS  ? hydrocortisone  sod succinate (SOLU-CORTEF) inj  75 mg Intravenous Q12H  ? linaclotide  145 mcg Oral QAC breakfast  ? mupirocin ointment  1 application. Nasal BID  ? pantoprazole  40 mg Oral Daily  ? rosuvastatin  10 mg Oral QHS  ? senna  2 tablet Oral BID  ? sulfamethoxazole-trimethoprim  1 tablet Oral Once per day on Mon Wed Fri  ? tapentadol  150 mg Oral Q12H  ? tiZANidine  2 mg Oral QHS  ? topiramate  100 mg Oral Daily  ? traZODone  100 mg Oral QHS  ? vitamin B-12  1,000 mcg Oral Daily  ? ? ?acetaminophen **OR** acetaminophen, albuterol, melatonin, ondansetron **OR** ondansetron (ZOFRAN) IV ? ? ?Objective:  ? ?Vitals:  ? 02/19/22 1901 02/19/22 1940 02/19/22 2326 02/20/22 0544  ?BP:  123/80 100/64 (!) 101/59  ?Pulse:  74 63 70  ?Resp:  20 16 20   ?Temp: 99.8 ?F (37.7 ?C) 97.8 ?F (36.6 ?C) 97.7 ?F (36.5 ?C) 98.5 ?F (36.9 ?C)  ?TempSrc: Oral Oral Oral   ?SpO2:  99% 96% 94%  ?Weight:  92.6 kg    ?Height:  5\' 4"  (1.626 m)    ? ? ?Intake/Output Summary (Last 24 hours) at 02/20/2022 1107 ?Last data filed at 02/20/2022 0600 ?Gross per 24 hour  ?Intake 3955.88 ml  ?Output 1000 ml  ?Net 2955.88 ml  ? ?Filed Weights  ? 02/19/22 1253 02/19/22 1940  ?Weight: 88.5 kg 92.6 kg  ? ? ? ?Examination:  ? ?Physical Exam  ?Constitution:  Alert, cooperative, no distress,  Appears calm and comfortable  ?Psychiatric:   Normal and stable mood and  affect, cognition intact,   ?HEENT:        Normocephalic, PERRL, otherwise with in Normal limits  ?Chest:         Chest symmetric ?Cardio vascular:  S1/S2, RRR, No murmure, No Rubs or Gallops  ?pulm

## 2022-02-20 NOTE — Hospital Course (Signed)
?  [name] is a 56 y.o. female with medical history significant of Wegener's granulomatosis who presented with severe and acute right hip pain. Patient reported bing at her baseline until this am when she was not able to get out of the bed due to severe right hip pain. Worse with movement and no improving factors, associated with subjective fever but no diaphoresis. Because of severe symptoms she came to the hospital for further evaluation.  ?Her Wegener's granulomatosis has been uncontrolled with worsening upper airway symptoms, she follows with [facility] and has been restarted on Rituximab first dose on 02/16/22.  ?About 1 month ago she had right shoulder pain, that also occurred after waking up, it slowly resolved by itself, and was associated with right upper extremity paresthesias.  ?   ?She has bilateral metatarsal amputation.  On further questioning she also has right ankle pain but no right knee pain.  ?Poor oral intake for the last 2 to 3 weeks due to upper respiratory symptoms.  ?  ?

## 2022-02-20 NOTE — Progress Notes (Signed)
Pt arrived from the ED to room 336 per stretcher with husband. Pt A&Ox4 with 1L [place]. Vital signs taken. Tele monitor placed. Purewick attached. Pt oriented to room, unit and safety precautions, verbalized understanding. Pain assessed and addressed. Will continue to monitor pt.  ?

## 2022-02-20 NOTE — TOC Initial Note (Signed)
Transition of Care (TOC) - Initial/Assessment Note  ? ? ?Patient Details  ?Name: [name] ?MRN: [id number] ?Date of Birth: October 22, 1966 ? ?Transition of Care (TOC) CM/SW Contact:    ?Iona Beard, LCSWA ?Phone Number: ?02/20/2022, 1:50 PM ? ?Clinical Narrative:                 ?Pt is high risk for readmission. CSW spoke with pt to complete assessment. Pt states that she lives with her husband. Pt states that she is independent at times but does need assistance about 50% of the time. CSW states that she is unable to drive. Pt states that she has had [place] services in the past. CSW spoke to [place] with [facility] who is able to accept the [facility] PT referral. TOC to request that MD place [facility] orders. Pt states that she has a scooter, walker, and wheelchair to use when needed. TOC to follow.  ? ?Expected Discharge Plan: [place] ?Barriers to Discharge: Continued Medical Work up ? ? ?Patient Goals and CMS Choice ?Patient states their goals for this hospitalization and ongoing recovery are:: Home with HH ?CMS Medicare.gov Compare Post Acute Care list provided to:: Patient ?Choice offered to / list presented to : Patient ? ?Expected Discharge Plan and Services ?Expected Discharge Plan: [place] ?In-house Referral: Clinical Social Work ?Discharge Planning Services: CM Consult ?Post Acute Care Choice: Home Health ?Living arrangements for the past 2 months: [place] ?                ?  ?  ?  ?  ?  ?HH Arranged: PT ?[place] Agency: [place] ?Date HH Agency Contacted: 02/20/22 ?  ?  ? ?Prior Living Arrangements/Services ?Living arrangements for the past 2 months: [place] ?Lives with:: Spouse ?Patient language and need for interpreter reviewed:: Yes ?Do you feel safe going back to the place where you live?: Yes      ?Need for Family Participation in Patient Care: Yes (Comment) ?Care giver support system in place?: Yes (comment) ?Current home services: DME (scooter, walker,  wheelchair) ?Criminal Activity/Legal Involvement Pertinent to Current Situation/Hospitalization: No - Comment as needed ? ?Activities of Daily Living ?Home Assistive Devices/Equipment: Eyeglasses, Other (Comment) (scooter) ?ADL Screening (condition at time of admission) ?Patient's cognitive ability adequate to safely complete daily activities?: Yes ?Is the patient deaf or have difficulty hearing?: No ?Does the patient have difficulty seeing, even when wearing glasses/contacts?: No ?Does the patient have difficulty concentrating, remembering, or making decisions?: No ?Patient able to express need for assistance with ADLs?: Yes ?Does the patient have difficulty dressing or bathing?: No ?Independently performs ADLs?: Yes (appropriate for developmental age) ?Does the patient have difficulty walking or climbing stairs?: Yes ?Weakness of Legs: Both ?Weakness of Arms/Hands: Left ? ?Permission Sought/Granted ?  ?  ?   ?   ?   ?   ? ?Emotional Assessment ?Appearance:: Appears stated age ?Attitude/Demeanor/Rapport: Engaged ?Affect (typically observed): Accepting ?Orientation: : Oriented to Self, Oriented to Place, Oriented to  Time, Oriented to Situation ?Alcohol / Substance Use: Not Applicable ?Psych Involvement: No (comment) ? ?Admission diagnosis:  Hip pain [M25.559] ?Sepsis with acute organ dysfunction, due to unspecified organism, unspecified type, unspecified whether septic shock present ([place]) [A41.9, R65.20] ?Patient Active Problem List  ? Diagnosis Date Noted  ? Hip pain 02/20/2022  ? Arthritis of right hip 02/19/2022  ? Hypothyroidism   ? AKI (acute kidney injury) ([place])   ?  Class 2 obesity   ? Depression   ? Essential hypertension   ? Acute cystitis without hematuria 01/04/2022  ? Dysfunction of left rotator cuff 01/04/2022  ? Right upper lobe pneumonia 01/04/2022  ? Acquired absence of other toe(s), unspecified side ([place]) 08/01/2021  ? Myonecrosis ([place]) 07/01/2021  ? Tear of gluteus medius tendon 07/01/2021  ? Left  wrist pain 06/01/2021  ? Medication monitoring encounter 05/10/2021  ? Chronic pain 03/24/2021  ? Stage 3a chronic kidney disease ([place]) 09/08/2019  ? 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 Steroid-induced hyperglycemia 05/11/2018  ? Morbid obesity ([place]) 01/25/2018  ? Neuropathy 01/25/2018  ? Presence of intrathecal pump 08/02/2017  ? Rectocele 03/09/2017  ? Abnormal finding on imaging 12/19/2016  ? Esophageal dysphagia 12/19/2016  ? Current chronic use of systemic steroids 10/26/2016  ? IFG (impaired fasting glucose) 10/26/2016  ? Arthralgia of multiple joints 07/15/2016  ? Headache 07/15/2016  ? Peripheral neuropathy 07/04/2016  ? 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 Neuropathic pain of both feet 01/11/2016  ? OSA (obstructive sleep apnea) 12/29/2015  ? Chronic insomnia 12/29/2015  ? Mixed hyperlipidemia 12/29/2015  ? Obesity, Class II, BMI 35-39.9, with comorbidity 12/29/2015  ? Polyneuropathy associated with underlying disease ([place]) 12/29/2015  ? 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 ([place]) 12/29/2015  ? Class 2 drug-induced obesity with serious comorbidity and body mass index (BMI) of 35.0 to 35.9 in adult 12/29/2015  ? Other mechanical complication of implanted electronic neurostimulator of spinal cord electrode (lead), initial encounter ([place]) 08/04/2015  ? Adjustment disorder with depressed mood 03/29/2015  ? 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 Obesity (BMI 30-39.9) 07/20/2014  ? Neurocardiogenic syncope 07/06/2014  ? Normocytic anemia 06/08/2014  ? POTS (postural orthostatic tachycardia syndrome) 06/08/2014  ? Encounter for long-term (current) use of high-risk medication 05/30/2013  ? Fall 05/30/2013  ? Other long term (current) drug therapy 05/30/2013  ? 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[place]) 05/30/2013  ? 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 Congenital syphilitic osteochondritis   ? GERD (gastroesophageal reflux  disease) 03/07/2012  ? Other diseases of trachea and bronchus 08/30/2011  ? 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 Atrophy of nasal turbinates 07/25/2011  ? Nasal septal perforation 07/25/2011  ? Cough 03/24/2011  ? Limited granulomatosis with polyangiitis ([place]) 03/24/2011  ? 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 Granulomatosis with polyangiitis ([place]) 2009  ? ?PCP:  Chesley Noon, MD ?Pharmacy:   ?[facility], [address] ?[address] ?[place] [address] ?Phone: [phone] Fax: [phone] ? ?[id number] [place] ?[place] ?[place] [address] ?Phone: [phone] Fax: [phone] ? ? ? ? ?Social Determinants of Health (SDOH) Interventions ?  ? ?Readmission Risk Interventions ? ?  02/20/2022  ?  1:49 PM  ?Readmission Risk Prevention Plan  ?Transportation Screening Complete  ?[place] or Home Care Consult Complete  ?Social Work Consult for [place] Planning/Counseling Complete  ?Palliative Care Screening Not Applicable  ?Medication Review Press photographer) Complete  ? ? ? ?

## 2022-02-20 NOTE — Progress Notes (Signed)
Inpatient Diabetes Program Recommendations ? ?AACE/ADA: New Consensus Statement on Inpatient Glycemic Control  ? ?Target Ranges:  Prepandial:   less than 140 mg/dL ?     Peak postprandial:   less than 180 mg/dL (1-2 hours) ?     Critically ill patients:  140 - 180 mg/dL  ? ? Latest Reference Range & Units 02/19/22 13:06 02/20/22 05:05  ?Glucose 70 - 99 mg/dL 146 (H) 215 (H)  ? ?Review of Glycemic Control ? ?Diabetes history: DM2 ?Outpatient Diabetes medications: None ?Current orders for Inpatient glycemic control: Solucortef 75 mg Q12H ? ?Inpatient Diabetes Program Recommendations:   ? ?Insulin: Please consider ordering Novolog 0-15 units TID with meals and Novolog 0-5 units QHS. ? ?Thanks, ?Barnie Alderman, RN, MSN, CDE ?Diabetes Coordinator ?Inpatient Diabetes Program ?[phone] (Team Pager from 8am to 5pm) ? ? ? ?

## 2022-02-21 DIAGNOSIS — M1611 Unilateral primary osteoarthritis, right hip: Secondary | ICD-10-CM | POA: Diagnosis not present

## 2022-02-21 LAB — CK TOTAL AND CKMB (NOT AT ARMC)
CK, MB: 8.4 ng/mL — ABNORMAL HIGH (ref 0.5–5.0)
Relative Index: 1 (ref 0.0–2.5)
Total CK: 868 U/L — ABNORMAL HIGH (ref 38–234)

## 2022-02-21 LAB — EXPECTORATED SPUTUM ASSESSMENT W GRAM STAIN, RFLX TO RESP C

## 2022-02-21 NOTE — Plan of Care (Signed)
?  Problem: Acute Rehab PT Goals(only PT should resolve) ?Goal: Pt Will Go Supine/Side To Sit ?Outcome: Progressing ?Flowsheets (Taken 02/21/2022 1437) ?Pt will go Supine/Side to Sit: ? with modified independence ? Independently ?Goal: Patient Will Transfer Sit To/From Stand ?Outcome: Progressing ?Flowsheets (Taken 02/21/2022 1437) ?Patient will transfer sit to/from stand: ? with modified independence ? with supervision ?Goal: Pt Will Transfer Bed To Chair/Chair To Bed ?Outcome: Progressing ?Flowsheets (Taken 02/21/2022 1437) ?Pt will Transfer Bed to Chair/Chair to Bed: ? with modified independence ? with supervision ?Goal: Pt Will Ambulate ?Outcome: Progressing ?Flowsheets (Taken 02/21/2022 1437) ?Pt will Ambulate: ? 25 feet ? with min guard assist ? with minimal assist ? with rolling walker ?  ?2:38 PM, 02/21/22 ?Ocie Bob, MPT ?Physical Therapist with [place] ?[facility] ?[phone] office ?[phone] mobile phone ? ?

## 2022-02-21 NOTE — Plan of Care (Signed)
  Problem: Education: Goal: Knowledge of General Education information will improve Description: Including pain rating scale, medication(s)/side effects and non-pharmacologic comfort measures Outcome: Progressing   Problem: Clinical Measurements: Goal: Will remain free from infection Outcome: Progressing   

## 2022-02-21 NOTE — Plan of Care (Signed)
?  Problem: Acute Rehab OT Goals (only OT should resolve) ?Goal: Pt. Will Perform Grooming ?Flowsheets (Taken 02/21/2022 1137) ?Pt Will Perform Grooming: ? with modified independence ? standing ?Goal: Pt. Will Perform Lower Body Dressing ?Flowsheets (Taken 02/21/2022 1137) ?Pt Will Perform Lower Body Dressing: ? with modified independence ? with supervision ? sitting/lateral leans ?Goal: Pt. Will Transfer To Toilet ?Flowsheets (Taken 02/21/2022 1137) ?Pt Will Transfer to Toilet: ? with modified independence ? stand pivot transfer ?Goal: Pt/Caregiver Will Perform Home Exercise Program ?Flowsheets (Taken 02/21/2022 1137) ?Pt/caregiver will Perform Home Exercise Program: ? Increased ROM ? Increased strength ? Right Upper extremity ? Left upper extremity ? With minimal assist ? [place] Postlewait OT, MOT ? ?

## 2022-02-21 NOTE — Progress Notes (Signed)
Patient ID: [name], female   DOB: 12/14/1965, 56 y.o.   MRN: [id number] ? ?Rec therapy no surgery  ? ?And again this faciklty is not equipped to handle this type of patient  ?

## 2022-02-21 NOTE — Progress Notes (Signed)
?PROGRESS NOTE ? ? ? ?Patient: [name]                            PCP: Chesley Noon, MD                    ?DOB: 10-05-1966            DOA: 02/19/2022 ?[id number]             DOS: 02/21/2022, 1:49 PM ? ? LOS: 1 day  ? ?Date of Service: The patient was seen and examined on 02/21/2022 ? ?Subjective:  ? ?The patient was seen and examined this morning, stable no acute distress still complaining of extensive right hip pain with all range of motion including weightbearing. ? ?Afebrile, normotensive, improved leukocytosis ? ?Was seen and evaluated by Dr. Aline Brochure ? ?Discussed MRI findings with the patient and her husband. ? ?Brief Narrative:  ? ? ?Paiten [name] is a 56 y.o. female with medical history significant of Wegener's granulomatosis who presented with severe and acute right hip pain. Patient reported bing at her baseline until this am when she was not able to get out of the bed due to severe right hip pain. Worse with movement and no improving factors, associated with subjective fever but no diaphoresis. Because of severe symptoms she came to the hospital for further evaluation.  ?Her Wegener's granulomatosis has been uncontrolled with worsening upper airway symptoms, she follows with [facility] and has been restarted on Rituximab first dose on 02/16/22.  ?About 1 month ago she had right shoulder pain, that also occurred after waking up, it slowly resolved by itself, and was associated with right upper extremity paresthesias.  ?   ?She has bilateral metatarsal amputation.  On further questioning she also has right ankle pain but no right knee pain.  ?Poor oral intake for the last 2 to 3 weeks due to upper respiratory symptoms.  ?  ? ? ? ?Assessment & Plan:  ? ?Principal Problem: ?  Arthritis of right hip ?Active Problems: ?  AKI (acute kidney injury) ([place]) ?  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  GERD (gastroesophageal reflux disease) ?  Hypothyroidism ?  Depression ?  Essential  hypertension ?  Class 2 obesity ?  Hip pain ? ? ? ? ?Assessment and Plan: ?* Arthritis of right hip ?Patient with fever and hypotension on admission, medical immunosuppression, need to rule out infectious process. ?Severe sepsis on admission. ? ?Plan to continue antibiotic therapy with vancomycin and cefepime. ?Continue fluids resuscitation with isotonic saline at 100 ml per hr.  ?Stress dose steroids with hydrocortisone 50 mg q8 hrs.  ?Follow up on blood cultures. ?Right hip [id number]: ?High-grade partial right gluteus minimus tendon tear at the greater ?trochanter with likely muscle tear component and significant ?intramuscular edema. Low-grade partial right gluteus medius tendon ?tear at the greater trochanter. Reactive intramuscular edema in the ?proximal right vastus lateralis. ?  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 No articular surface collapse. ?  ?Mild right hip osteoarthritis with degenerative anterior superior ?labral tearing and posterosuperior labral fraying. ? ?-Orthopedic Dr. Aline Brochure consulted, appreciate input ?Continue PT, as needed analgesics ? ? ?AKI (acute kidney injury) ([place]) ?Likely pre -renal associated with poor oral intake and ?Less likely sepsis.Marland Kitchen  ?Sepsis was ruled out ?Continue isotonic IV fluids with normal saline at 100 ml per hr ?Follow up renal function in am, avoid hypotension  and nephrotoxic medications.  ? 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 ?Continue close monitoring.  ?Ruling out systemic infection in the setting of immunosuppression.  ? ?Continue prophylactic bactrim three times per week.  ? ?GERD (gastroesophageal reflux disease) ?Continue with antiacid therapy.  ? ?Hypothyroidism ?Continue with levothyroxine  ? ?Depression ?Continue with amitriptyline and duloxetine.  ?Continue trazodone and topiramate.  ? ?Essential hypertension ?Hold on propranolol for now due to risk of worsening  hypotension.  ? ?Dyslipidemia. Continue with statin therapy.  ? ?Class 2 obesity ?Calculated BMI is 35,04.  ? ? ? ? ?Cultures; ?Blood Cultures x 2 >> NGT ? ? ?---------------------------------------------------------------------------------------------------------------- ? ?DVT prophylaxis:  ?enoxaparin (LOVENOX) injection 40 mg Start: 02/19/22 2200 ?SCDs Start: 02/19/22 1948 ? ? ?Code Status:   Code Status: Full Code ? ?Family Communication: Discussed with her husband on the phone at the bedside ?The above findings and plan of care has been discussed with patient (and family)  in detail,  ?they expressed understanding and agreement of above. ?-Advance care planning has been discussed.  ? ?Disposition: ?If remains stable, no growth on cultures, antibiotics may be discontinued, in Am. ?-likely to be discharged home with home health in a.m. ? ? ?admission status:   ?Status is: Observation ?The patient remains OBS appropriate and will d/c before 2 midnights. ? ? ? ? ?Procedures:  ? ?No admission procedures for hospital encounter. ? ? ?Antimicrobials:  ?Anti-infectives (From admission, onward)  ? ? Start     Dose/Rate Route Frequency Ordered Stop  ? 02/20/22 1500  vancomycin (VANCOREADY) IVPB 1250 mg/250 mL  Status:  Discontinued       ? 1,250 mg ?166.7 mL/hr over 90 Minutes Intravenous Every 24 hours 02/19/22 1402 02/20/22 1108  ? 02/20/22 1400  ceFEPIme (MAXIPIME) 2 g in sodium chloride 0.9 % 100 mL IVPB       ? 2 g ?200 mL/hr over 30 Minutes Intravenous Every 8 hours 02/20/22 1221    ? 02/20/22 0900  sulfamethoxazole-trimethoprim (BACTRIM) 400-80 MG per tablet 1 tablet       ?Note to Pharmacy: Monday,Wednesday and Friday    ? 1 tablet Oral Once per day on Mon Wed Fri 02/19/22 1947    ? 02/20/22 0200  ceFEPIme (MAXIPIME) 2 g in sodium chloride 0.9 % 100 mL IVPB  Status:  Discontinued       ? 2 g ?200 mL/hr over 30 Minutes Intravenous Every 12 hours 02/19/22 1402 02/20/22 1221  ? 02/19/22 1400  vancomycin (VANCOREADY)  IVPB 1500 mg/300 mL       ? 1,500 mg ?150 mL/hr over 120 Minutes Intravenous  Once 02/19/22 1329 02/19/22 1734  ? 02/19/22 1330  aztreonam (AZACTAM) 2 g in sodium chloride 0.9 % 100 mL IVPB  Status:  Discontinued       ? 2 g ?200 mL/hr over 30 Minutes Intravenous  Once 02/19/22 1323 02/19/22 1329  ? 02/19/22 1330  metroNIDAZOLE (FLAGYL) IVPB 500 mg       ? 500 mg ?100 mL/hr over 60 Minutes Intravenous  Once 02/19/22 1323 02/19/22 1514  ? 02/19/22 1330  vancomycin (VANCOCIN) IVPB 1000 mg/200 mL premix  Status:  Discontinued       ? 1,000 mg ?200 mL/hr over 60 Minutes Intravenous  Once 02/19/22 1323 02/19/22 1329  ? 02/19/22 1330  ceFEPIme (MAXIPIME) 2 g in sodium chloride 0.9 % 100 mL IVPB       ? 2 g ?200 mL/hr over 30 Minutes Intravenous  Once 02/19/22 1329 02/19/22 1430  ? ?  ? ? ? ?Medication:  ? amitriptyline  50 mg Oral QHS  ? Chlorhexidine Gluconate Cloth  6 each Topical Daily  ? DULoxetine  60 mg Oral Daily  ? enoxaparin (LOVENOX) injection  40 mg Subcutaneous [id number]  ? folic acid  2 mg Oral Daily  ? gabapentin  900 mg Oral QAC breakfast  ? And  ? gabapentin  600 mg Oral Q1400  ? And  ? gabapentin  1,200 mg Oral QHS  ? hydrocortisone sod succinate (SOLU-CORTEF) inj  75 mg Intravenous Q12H  ? linaclotide  145 mcg Oral QAC breakfast  ? mupirocin ointment  1 application. Nasal BID  ? pantoprazole  40 mg Oral Daily  ? rosuvastatin  10 mg Oral QHS  ? senna  2 tablet Oral BID  ? sulfamethoxazole-trimethoprim  1 tablet Oral Once per day on Mon Wed Fri  ? tapentadol  150 mg Oral Q12H  ? tiZANidine  2 mg Oral QHS  ? topiramate  100 mg Oral Daily  ? traMADol  50 mg Oral Q12H  ? traZODone  100 mg Oral QHS  ? vitamin B-12  1,000 mcg Oral Daily  ? ? ?acetaminophen **OR** acetaminophen, albuterol, melatonin, ondansetron **OR** ondansetron (ZOFRAN) IV, oxyCODONE ? ? ?Objective:  ? ?Vitals:  ? 02/20/22 0544 02/20/22 1608 02/20/22 2229 02/21/22 0604  ?BP: (!) 101/59 103/64 (!) 107/97 139/86  ?Pulse: 70 66 74 70  ?Resp: 20 20 18  18   ?Temp: 98.5 ?F (36.9 ?C) 98.1 ?F (36.7 ?C) 98.1 ?F (36.7 ?C) 98.1 ?F (36.7 ?C)  ?TempSrc:    Oral  ?SpO2: 94% 94% 98% 95%  ?Weight:      ?Height:      ? ? ?Intake/Output Summary (Last 24 hours) at 02/21/2022 134

## 2022-02-21 NOTE — Evaluation (Signed)
Physical Therapy Evaluation ?Patient Details ?Name: [name] ?MRN: [id number] ?DOB: 1966-07-24 ?Today's Date: 02/21/2022 ? ?History of Present Illness ? Linzy Laury is a 56 y.o. female with medical history significant of Wegener's granulomatosis who presented with severe and acute right hip pain. Patient reported bing at her baseline until this am when she was not able to get out of the bed due to severe right hip pain. Worse with movement and no improving factors, associated with subjective fever but no diaphoresis. Because of severe symptoms she came to the hospital for further evaluation.   Her Wegener's granulomatosis has been uncontrolled with worsening upper airway symptoms, she follows with [facility] and has been restarted on Rituximab first dose on 02/16/22.   About 1 month ago she had right shoulder pain, that also occurred after waking up, it slowly resolved by itself, and was associated with right upper extremity paresthesias.       She has bilateral metatarsal amputation.   On further questioning she also has right ankle pain but no right knee pain.   Poor oral intake for the last 2 to 3 weeks due to upper respiratory symptoms. ?  ?Clinical Impression ? Patient functioning near baseline for functional mobility and gait other than mostly limited due to increasing right hip pain when taking steps at bedside.  Patient demonstrates good return for sitting up at bedside and completing step pivot transfers leaning on armrest of chair without loss of balance with increased time.  Patient tolerated sitting up in chair after therapy - RN notified.  Patient will benefit from continued skilled physical therapy in hospital and recommended venue below to increase strength, balance, endurance for safe ADLs and gait.  ? ?   ?   ? ?Recommendations for follow up therapy are one component of a multi-disciplinary discharge planning process, led by the attending physician.  Recommendations may be updated  based on patient status, additional functional criteria and insurance authorization. ? ?Follow Up Recommendations Home health PT ? ?  ?Assistance Recommended at Discharge Set up Supervision/Assistance  ?Patient can return home with the following ? A little help with walking and/or transfers;A little help with bathing/dressing/bathroom;Help with stairs or ramp for entrance;Assistance with cooking/housework ? ?  ?Equipment Recommendations None recommended by PT  ?Recommendations for Other Services ?    ?  ?Functional Status Assessment Patient has had a recent decline in their functional status and demonstrates the ability to make significant improvements in function in a reasonable and predictable amount of time.  ? ?  ?Precautions / Restrictions Precautions ?Precautions: Fall ?Restrictions ?Weight Bearing Restrictions: No  ? ?  ? ?Mobility ? Bed Mobility ?Overal bed mobility: Modified Independent ?  ?  ?  ?  ?  ?  ?  ?  ? ?Transfers ?Overall transfer level: Needs assistance ?  ?Transfers: Sit to/from Stand, Bed to chair/wheelchair/BSC ?Sit to Stand: Min guard ?  ?Step pivot transfers: Min guard ?  ?  ?  ?General transfer comment: as per OT notes ?  ? ?Ambulation/Gait ?Ambulation/Gait assistance: Min assist, Mod assist ?Gait Distance (Feet): 5 Feet ?Assistive device: Rolling walker (2 wheels) ?Gait Pattern/deviations: Decreased step length - right, Decreased step length - left, Decreased stride length ?Gait velocity: decreased ?  ?  ?General Gait Details: limited to a few steps at bedside before having to turn around due to increasing right hip pain and generalized weakness using RW ? ?Stairs ?  ?  ?  ?  ?  ? ?  Wheelchair Mobility ?  ? ?Modified Rankin (Stroke Patients Only) ?  ? ?  ? ?Balance Overall balance assessment: Needs assistance ?Sitting-balance support: Feet supported, No upper extremity supported ?Sitting balance-Leahy Scale: Good ?Sitting balance - Comments: seated EOB ?  ?Standing balance support:  Bilateral upper extremity supported, During functional activity ?Standing balance-Leahy Scale: Fair ?Standing balance comment: using RW ?  ?  ?  ?  ?  ?  ?  ?  ?  ?  ?  ?   ? ? ? ?Pertinent Vitals/Pain Pain Assessment ?Pain Assessment: 0-10 ?Pain Score: 6  ?Pain Location: R hip. ?Pain Descriptors / Indicators: Aching, Other (Comment) ?Pain Intervention(s): Monitored during session, Limited activity within patient's tolerance, Repositioned  ? ? ?Home Living Family/patient expects to be discharged to:: Private residence ?Living Arrangements: Spouse/significant other ?Available Help at Discharge: Family;Available PRN/intermittently ?Type of Home: House ?Home Access: Stairs to enter ?Entrance Stairs-Rails: None ?Entrance Stairs-Number of Steps: 2 ?  ?Home Layout: One level ?Home Equipment: Insurance risk surveyor (2 wheels);BSC/3in1;Cane - single point;Tub bench;Grab bars - tub/shower ?Additional Comments: adjustable bed  ?  ?Prior Function Prior Level of Function : Needs assist ?  ?  ?  ?Physical Assist : Mobility (physical);ADLs (physical) ?Mobility (physical): Transfers;Gait;Stairs;Bed mobility ?ADLs (physical): Dressing;Toileting;IADLs;Bathing ?Mobility Comments: walks short distances with furniture walking and scooter for longer distances; primarily using scotter in house recently ?ADLs Comments: Assisted for bathing and dressing. Pt able to complete toileting without assist. Grooming and feeding are independent. Assisted for IADL's by husband. ?  ? ? ?Hand Dominance  ? Dominant Hand: Left ? ?  ?Extremity/Trunk Assessment  ? Upper Extremity Assessment ?Upper Extremity Assessment: Defer to OT evaluation ?RUE Deficits / Details: Generalized weakness. ?RUE Coordination: WNL ?LUE Deficits / Details: 2+/5 shoulder MMT; gernaly weak for other motions. Pt reports numbness in digits. ?LUE Sensation: decreased light touch (digits) ?LUE Coordination: decreased gross motor ?  ? ?Lower Extremity Assessment ?Lower  Extremity Assessment: Generalized weakness ?  ? ?Cervical / Trunk Assessment ?Cervical / Trunk Assessment: Normal  ?Communication  ? Communication: No difficulties  ?Cognition Arousal/Alertness: Awake/alert ?Behavior During Therapy: [facility] for tasks assessed/performed ?Overall Cognitive Status: Within Functional Limits for tasks assessed ?  ?  ?  ?  ?  ?  ?  ?  ?  ?  ?  ?  ?  ?  ?  ?  ?  ?  ?  ? ?  ?General Comments   ? ?  ?Exercises    ? ?Assessment/Plan  ?  ?PT Assessment Patient needs continued PT services  ?PT Problem List Decreased strength;Decreased activity tolerance;Decreased balance;Decreased mobility ? ?   ?  ?PT Treatment Interventions DME instruction;Gait training;Stair training;Functional mobility training;Therapeutic activities;Therapeutic exercise;Patient/family education;Balance training   ? ?PT Goals (Current goals can be found in the Care Plan section)  ?Acute Rehab PT Goals ?Patient Stated Goal: return home with family to assist ?PT Goal Formulation: With patient ?Time For Goal Achievement: 02/28/22 ?Potential to Achieve Goals: Good ? ?  ?Frequency Min 3X/week ?  ? ? ?Co-evaluation PT/OT/SLP Co-Evaluation/Treatment: Yes ?Reason for Co-Treatment: To address functional/ADL transfers ?PT goals addressed during session: Mobility/safety with mobility;Balance;Proper use of DME ?OT goals addressed during session: ADL's and self-care ?  ? ? ?  ?AM-PAC PT "6 Clicks" Mobility  ?Outcome Measure Help needed turning from your back to your side while in a flat bed without using bedrails?: None ?Help needed moving from lying on your back to sitting on the side of  a flat bed without using bedrails?: None ?Help needed moving to and from a bed to a chair (including a wheelchair)?: A Little ?Help needed standing up from a chair using your arms (e.g., wheelchair or bedside chair)?: A Little ?Help needed to walk in hospital room?: A Little ?Help needed climbing 3-5 steps with a railing? : A Lot ?6 Click Score: 19 ? ?   ?End of Session   ?Activity Tolerance: Patient tolerated treatment well;Patient limited by fatigue ?Patient left: in chair;with call bell/phone within reach ?Nurse Communication: Mobility status ?PT Visit Diagnosis

## 2022-02-21 NOTE — Evaluation (Addendum)
Occupational Therapy Evaluation ?Patient Details ?Name: [name] ?MRN: [id number] ?DOB: 1966-08-24 ?Today's Date: 02/21/2022 ? ? ?History of Present Illness Courtney Bellizzi is a 56 y.o. female with medical history significant of Wegener's granulomatosis who presented with severe and acute right hip pain. Patient reported bing at her baseline until this am when she was not able to get out of the bed due to severe right hip pain. Worse with movement and no improving factors, associated with subjective fever but no diaphoresis. Because of severe symptoms she came to the hospital for further evaluation.   Her Wegener's granulomatosis has been uncontrolled with worsening upper airway symptoms, she follows with [facility] and has been restarted on Rituximab first dose on 02/16/22.   About 1 month ago she had right shoulder pain, that also occurred after waking up, it slowly resolved by itself, and was associated with right upper extremity paresthesias.       She has bilateral metatarsal amputation.   On further questioning she also has right ankle pain but no right knee pain.   Poor oral intake for the last 2 to 3 weeks due to upper respiratory symptoms. (Per MD)  ? ?Clinical Impression ?  ?Pt agreeable to OT and PT co-evaluation. Pt reports pain in R hip which increased with mobility during session. Pt assisted some for bathing and dressing at baseline. This date pt was assisted to don socks while in bed but demonstrated bed mobility without physical assist. Pt able to transfer to chair without AD while leaning on chair with min G assist. Pt amble to take a few steps in the room with RW and fair balance. Pt is generally weak with increased weakness and mobility issues with L shoulder A/ROM. Pt would benefit from home health therapy as long as family is able to provide support enough to check in on pt throughout the day. Pt will benefit from continued OT in the hospital and recommended venue below to  increase strength, balance, and endurance for safe ADL's.  ? ?  ?   ? ?Recommendations for follow up therapy are one component of a multi-disciplinary discharge planning process, led by the attending physician.  Recommendations may be updated based on patient status, additional functional criteria and insurance authorization.  ? ?Follow Up Recommendations ? Home health OT  ?  ?Assistance Recommended at Discharge Intermittent Supervision/Assistance  ?Patient can return home with the following A little help with walking and/or transfers;A little help with bathing/dressing/bathroom;Assistance with cooking/housework;Assist for transportation;Help with stairs or ramp for entrance ? ?  ?Functional Status Assessment ? Patient has had a recent decline in their functional status and demonstrates the ability to make significant improvements in function in a reasonable and predictable amount of time.  ?Equipment Recommendations ? None recommended by OT  ?  ?   ? ? ?  ?Precautions / Restrictions Precautions ?Precautions: Fall ?Restrictions ?Weight Bearing Restrictions: No  ? ?  ? ?Mobility Bed Mobility ?Overal bed mobility: Modified Independent ?  ?  ?  ?  ?  ?  ?General bed mobility comments: mild labored movement ?  ? ?Transfers ?Overall transfer level: Needs assistance ?  ?Transfers: Sit to/from Stand, Bed to chair/wheelchair/BSC ?Sit to Stand: Min guard ?  ?  ?Step pivot transfers: Min guard ?  ?  ?General transfer comment: EOB to chair without AD with pt leaning on chair. RW Used for ambulation in room for a few feet. ?  ? ?  ?Balance Overall balance assessment: Needs  assistance ?Sitting-balance support: No upper extremity supported, Feet supported ?Sitting balance-Leahy Scale: Good ?Sitting balance - Comments: seated EOB ?  ?Standing balance support: Bilateral upper extremity supported, During functional activity ?Standing balance-Leahy Scale: Fair ?Standing balance comment: using RW ?  ?  ?  ?  ?  ?  ?  ?  ?  ?  ?  ?    ? ?ADL either performed or assessed with clinical judgement  ? ?ADL Overall ADL's : Needs assistance/impaired ?  ?  ?Grooming: Standing;Min guard ?  ?  ?  ?  ?  ?Upper Body Dressing : Modified independent;Sitting ?  ?Lower Body Dressing: Maximal assistance;Bed level ?Lower Body Dressing Details (indicate cue type and reason): Assited to don socks in bed today. ?Toilet Transfer: Min guard;Stand-pivot ?Toilet Transfer Details (indicate cue type and reason): Simulated via EOB to chair. ?Toileting- Clothing Manipulation and Hygiene: Min guard;Sitting/lateral lean;Sit to/from stand ?  ?  ?  ?Functional mobility during ADLs: Min guard;Rolling walker (2 wheels) ?General ADL Comments: Pt assisted for bathing and dressing at baseline. Pt needing using or RW for standing.  ? ? ? ?Vision Baseline Vision/History: 1 Wears glasses ?Ability to See in Adequate Light: 1 Impaired ?Patient Visual Report: No change from baseline ?Vision Assessment?: No apparent visual deficits  ?   ?   ?  ?   ?  ? ?Pertinent Vitals/Pain Pain Assessment ?Pain Assessment: 0-10 ?Pain Score: 6  (At start of session.) ?Pain Location: R hip. ?Pain Descriptors / Indicators: Aching, Other (Comment) (intense) ?Pain Intervention(s): Limited activity within patient's tolerance, Monitored during session, Repositioned  ? ? ? ?Hand Dominance Left ?  ?Extremity/Trunk Assessment Upper Extremity Assessment ?Upper Extremity Assessment: RUE deficits/detail;LUE deficits/detail ?RUE Deficits / Details: Generalized weakness. ?RUE Coordination: WNL ?LUE Deficits / Details: 2+/5 shoulder MMT; WFL P/ROM;  gernaly weak for other motions. Pt reports numbness in digits. ?LUE Sensation: decreased light touch (digits) ?LUE Coordination: decreased gross motor ?  ?Lower Extremity Assessment ?Lower Extremity Assessment: Defer to PT evaluation ?  ?Cervical / Trunk Assessment ?Cervical / Trunk Assessment: Normal ?  ?Communication Communication ?Communication: No difficulties ?   ?Cognition Arousal/Alertness: Awake/alert ?Behavior During Therapy: [facility] for tasks assessed/performed ?Overall Cognitive Status: Within Functional Limits for tasks assessed ?  ?  ?  ?  ?  ?  ?  ?  ?  ?  ?  ?  ?  ?  ?  ?  ?  ?  ?  ?   ? ?  ?   ?  ?    ? ? ?Home Living Family/patient expects to be discharged to:: Private residence ?Living Arrangements: Spouse/significant other ?Available Help at Discharge: Family;Available PRN/intermittently ?Type of Home: House ?Home Access: Stairs to enter ?Entrance Stairs-Number of Steps: 2 ?Entrance Stairs-Rails: None ?Home Layout: One level ?  ?  ?Bathroom Shower/Tub: Tub/shower unit ?  ?Bathroom Toilet: Standard (Places BSC over toilet.) ?Bathroom Accessibility: Yes ?How Accessible: Accessible via walker ?Home Equipment: Insurance risk surveyor (2 wheels);BSC/3in1;Cane - single point;Tub bench;Grab bars - tub/shower ?  ?Additional Comments: adjustable bed ?  ? ?  ?Prior Functioning/Environment Prior Level of Function : Needs assist ?  ?  ?  ?Physical Assist : Mobility (physical);ADLs (physical) ?Mobility (physical): Transfers;Gait;Stairs ?ADLs (physical): Dressing;Toileting;IADLs;Bathing ?Mobility Comments: walks short distances with furniture walking and scooter for longer distances; primarily using scotter in house recently ?ADLs Comments: Assisted for bathing and dressing. Pt able to complete toileting without assist. Grooming and feeding are independent. Assisted for IADL's  by husband. ?  ? ?  ?  ?OT Problem List: Decreased strength;Decreased range of motion;Decreased activity tolerance;Impaired balance (sitting and/or standing);Decreased coordination;Pain ?  ?   ?OT Treatment/Interventions: Self-care/ADL training;Therapeutic exercise;Therapeutic activities;Patient/family education;Balance training  ?  ?OT Goals(Current goals can be found in the care plan section) Acute Rehab OT Goals ?Patient Stated Goal: return home ?OT Goal Formulation: With patient ?Time For Goal  Achievement: 03/07/22 ?Potential to Achieve Goals: Good  ?OT Frequency: Min 1X/week ?  ? ?Co-evaluation PT/OT/SLP Co-Evaluation/Treatment: Yes ?Reason for Co-Treatment: To address functional/ADL transfers ?  ?OT [place]

## 2022-02-21 NOTE — Consult Note (Addendum)
Reason for Consult: Painful right hip ?Referring Physician: Deatra James, MD ? ?[name] is an 56 y.o. female.  ? ? ?56 year old female with diabetes hypothyroidism history of myonecrosis obstructive sleep apnea bilateral feet with osteomyelitis status post transmetatarsal amputations peripheral neuropathy Wegener's granulomatosis peripheral neuropathy presence of intrathecal pump status post back surgery presents with a second bout of pain in her hip at this time its the right last year it was the left.  She had a similar presentation last year with myositis seen on MRI ? ?This time she fell 2 weeks ago got up walked had some pain but was able to walk to her normal activities until 2 weeks later and presented on the day of admission with inability to ambulate severe right hip pain ? ?She did get an MRI and plain films ? ?Her MRI shows avascular necrosis stable ? ?Myositis gluteus medius gluteus minimus, redness tear but more likely to be fluid from the myositis ? ?Arthritis right hip ? ?As Dr. Alvan Dame stated last year this patient is not a good elective surgical candidate for hip replacement ? ?The myositis/can be managed nonoperatively as long as she does not remain febrile or septic. ? ? ?Past Medical History:  ?Diagnosis Date  ? Chronic cough   ? Chronic pain   ? Diabetes mellitus without complication ([place])   ? Dyspnea   ? GERD (gastroesophageal reflux disease)   ? Hypokalemia   ? Hypothyroidism   ? Left wrist pain 06/01/2021  ? Myonecrosis ([place]) 07/01/2021  ? Neurocardiogenic syncope   ? OSA (obstructive sleep apnea)   ? does not use CPAP  ? Osteomyelitis ([place])   ? bilateral feet  ? Peripheral neuropathy   ? Presence of intrathecal pump   ? recieves Prialt/bupivicaine  ? Tear of gluteus medius tendon 07/01/2021  ? Wegener's granulomatosis 2009  ? Wegner's disease (congenital syphilitic osteochondritis)   ? ? ?Past Surgical History:  ?Procedure Laterality Date  ? AMPUTATION Right 10/21/2020  ? Procedure: Right  hallux amputation;  Surgeon: Wylene Simmer, MD;  Location: [place];  Service: Orthopedics;  Laterality: Right;  ? AMPUTATION TOE Bilateral 08/12/2020  ? Procedure: Right 3rd toe amputation; left hallux and 3rd toe amputation;  Surgeon: Wylene Simmer, MD;  Location: [place];  Service: Orthopedics;  Laterality: Bilateral;  53min  ? AMPUTATION TOE Right 03/24/2021  ? Procedure: AMPUTATION TOE;  Surgeon: Wylene Simmer, MD;  Location: [place];  Service: Orthopedics;  Laterality: Right;  ? APPENDECTOMY    ? BACK SURGERY    ? CHOLECYSTECTOMY    ? COLONOSCOPY N/A 06/02/2013  ? Procedure: COLONOSCOPY;  Surgeon: Wonda Horner, MD;  Location: [facility] ENDOSCOPY;  Service: Endoscopy;  Laterality: N/A;  ? HERNIA REPAIR  2000  ? INCISION AND DRAINAGE ABSCESS Left 07/07/2016  ? Procedure: DEBRIDMENT LEFT THIGH ABSCESS, EXISION ACUTE SKIN RASH LEFT THIGH(1CM LESION);  Surgeon: Fanny Skates, MD;  Location: [facility];  Service: General;  Laterality: Left;  ? SEPTOPLASTY  02/2013  ? spleen anuyism    ? splenic aneurysm    ? TRANSMETATARSAL AMPUTATION Bilateral 10/21/2020  ? Procedure: Left transmetatarsal amputation;  Surgeon: Wylene Simmer, MD;  Location: [place];  Service: Orthopedics;  Laterality: Bilateral;  ? TRANSMETATARSAL AMPUTATION Left 03/24/2021  ? Procedure: TRANSMETATARSAL AMPUTATION;  Surgeon: Wylene Simmer, MD;  Location: [place];  Service: Orthopedics;  Laterality: Left;  ? VAGINAL HYSTERECTOMY    ? ? ?Family History  ?Problem Relation Age of  Onset  ? Diabetes Mother   ? Heart disease Mother   ? Diabetes Father   ? ? ?Social History:  reports that she has never smoked. She has never used smokeless tobacco. She reports current alcohol use. She reports that she does not use drugs. ? ?Allergies:  ?Allergies  ?Allergen Reactions  ? Ibuprofen Other (See Comments)  ?  Pt is unable to take this due to kidney problems.   ?  ? Penicillins Rash and Other (See Comments)  ?  Has patient had a PCN  reaction causing immediate rash, facial/tongue/throat swelling, SOB or lightheadedness with hypotension: No ?Has patient had a PCN reaction causing severe rash involving mucus membranes or skin necrosis: No ?Has patient had a PCN reaction that required hospitalization No ?Has patient had a PCN reaction occurring within the last 10 years: No ?If all of the above answers are "NO", then may proceed with Cephalosporin use.  ? ? ?Medications:  ?Meds ordered this encounter  ?Medications  ? sodium chloride 0.9 % bolus 2,000 mL  ? acetaminophen (TYLENOL) tablet 650 mg  ? hydrocortisone sodium succinate (SOLU-CORTEF) 100 MG injection 100 mg  ?  IV hydrocortisone will be converted to either a q8h or q12h frequency with the same total daily dose (TDD).  ?Ordered Dose: 1 to 200 mg TDD; convert to: TDD div q12h.  ?Ordered Dose: 201 to 300 mg TDD; convert to: TDD div q8h.  ?Ordered Dose: >300 mg TDD; DAW.  ? DISCONTD: aztreonam (AZACTAM) 2 g in sodium chloride 0.9 % 100 mL IVPB  ?  Order Specific Question:   Antibiotic Indication:  ?  Answer:   Other Indication (list below)  ?  Order Specific Question:   Other Indication:  ?  Answer:   Unknown source  ? metroNIDAZOLE (FLAGYL) IVPB 500 mg  ?  Order Specific Question:   Antibiotic Indication:  ?  Answer:   Other Indication (list below)  ?  Order Specific Question:   Other Indication:  ?  Answer:   Unknown source  ? DISCONTD: vancomycin (VANCOCIN) IVPB 1000 mg/200 mL premix  ?  Order Specific Question:   Indication:  ?  Answer:   Other Indication (list below)  ?  Order Specific Question:   Other Indication:  ?  Answer:   Unknown source  ? ceFEPIme (MAXIPIME) 2 g in sodium chloride 0.9 % 100 mL IVPB  ?  Order Specific Question:   Antibiotic Indication:  ?  Answer:   Other Indication (list below)  ?  Order Specific Question:   Other Indication:  ?  Answer:   unknown source  ? vancomycin (VANCOREADY) IVPB 1500 mg/300 mL  ?  Order Specific Question:   Indication:  ?  Answer:   Other  Indication (list below)  ?  Order Specific Question:   Other Indication:  ?  Answer:   Unknown source  ? fentaNYL (SUBLIMAZE) injection 50 mcg  ? DISCONTD: ceFEPIme (MAXIPIME) 2 g in sodium chloride 0.9 % 100 mL IVPB  ?  Order Specific Question:   Antibiotic Indication:  ?  Answer:   Other Indication (list below)  ?  Order Specific Question:   Other Indication:  ?  Answer:   unknown source  ? DISCONTD: vancomycin (VANCOREADY) IVPB 1250 mg/250 mL  ?  Order Specific Question:   Indication:  ?  Answer:   Other Indication (list below)  ?  Order Specific Question:   Other Indication:  ?  Answer:   unknown  source  ? HYDROmorphone (DILAUDID) injection 0.5 mg  ? DISCONTD: acetaminophen (TYLENOL) tablet 650 mg  ? DISCONTD: tapentadol HCl (NUCYNTA) tablet 75 mg  ?  Take 1 tablet in the morning, 1 tablet in the afternoon and 2 tablets at bedtime    ? sulfamethoxazole-trimethoprim (BACTRIM) 400-80 MG per tablet 1 tablet  ?  Monday,Wednesday and Friday    ? DISCONTD: propranolol (INDERAL) tablet 40 mg  ? rosuvastatin (CRESTOR) tablet 10 mg  ? amitriptyline (ELAVIL) tablet 50 mg  ? DULoxetine (CYMBALTA) DR capsule 60 mg  ? traZODone (DESYREL) tablet 100 mg  ? DISCONTD: predniSONE (DELTASONE) tablet 5 mg  ? linaclotide (LINZESS) capsule 145 mcg  ? pantoprazole (PROTONIX) EC tablet 40 mg  ? senna (SENOKOT) tablet 123XX123 mg  ? folic acid (FOLVITE) tablet 2 mg  ? vitamin B-12 (CYANOCOBALAMIN) tablet 1,000 mcg  ? melatonin tablet 9 mg  ? DISCONTD: naloxone (NARCAN) nasal spray 4 mg/0.1 mL  ? DISCONTD: gabapentin (NEURONTIN) capsule 600-1,200 mg  ?  Take 3 (900 mg total) capsules by mouth in the morning, 2 (600 mg) capsule in the afternoon and 4 (1200 mg) capsules in the evening.    ? tiZANidine (ZANAFLEX) tablet 2 mg  ? topiramate (TOPAMAX) tablet 100 mg  ? albuterol (PROVENTIL) (2.5 MG/3ML) 0.083% nebulizer solution 3 mL  ? enoxaparin (LOVENOX) injection 40 mg  ? OR Linked Order Group  ?  acetaminophen (TYLENOL) tablet 650 mg  ?   acetaminophen (TYLENOL) suppository 650 mg  ? OR Linked Order Group  ?  ondansetron (ZOFRAN) tablet 4 mg  ?  ondansetron (ZOFRAN) injection 4 mg  ? DISCONTD: tapentadol HCl (NUCYNTA) tablet 75 mg  ? DISCONTD: t

## 2022-02-22 DIAGNOSIS — M609 Myositis, unspecified: Secondary | ICD-10-CM

## 2022-02-22 LAB — CK TOTAL AND CKMB (NOT AT ARMC)
CK, MB: 4.2 ng/mL (ref 0.5–5.0)
Relative Index: 1 (ref 0.0–2.5)
Total CK: 422 U/L — ABNORMAL HIGH (ref 38–234)

## 2022-02-22 LAB — CBC
HCT: 29.1 % — ABNORMAL LOW (ref 36.0–46.0)
Hemoglobin: 8.2 g/dL — ABNORMAL LOW (ref 12.0–15.0)
MCH: 24.2 pg — ABNORMAL LOW (ref 26.0–34.0)
MCHC: 28.2 g/dL — ABNORMAL LOW (ref 30.0–36.0)
MCV: 85.8 fL (ref 80.0–100.0)
Platelets: 304 10*3/uL (ref 150–400)
RBC: 3.39 MIL/uL — ABNORMAL LOW (ref 3.87–5.11)
RDW: 17.5 % — ABNORMAL HIGH (ref 11.5–15.5)
WBC: 11.5 10*3/uL — ABNORMAL HIGH (ref 4.0–10.5)
nRBC: 0 % (ref 0.0–0.2)

## 2022-02-22 MED ORDER — CEFAZOLIN SODIUM-DEXTROSE 2-4 GM/100ML-% IV SOLN
2.0000 g | Freq: Three times a day (TID) | INTRAVENOUS | Status: DC
  Administered 2022-02-22 – 2022-02-23 (×3): 2 g via INTRAVENOUS
  Filled 2022-02-22 (×3): qty 100

## 2022-02-22 MED ORDER — HYDROCORTISONE SOD SUC (PF) 100 MG IJ SOLR: 50.0000 mg | Freq: Two times a day (BID) | INTRAMUSCULAR | Status: DC

## 2022-02-22 MED ORDER — METHYLPREDNISOLONE SODIUM SUCC 40 MG IJ SOLR
40.0000 mg | Freq: Two times a day (BID) | INTRAMUSCULAR | Status: DC
  Administered 2022-02-23 (×2): 40 mg via INTRAVENOUS
  Filled 2022-02-22 (×2): qty 1

## 2022-02-22 MED ORDER — METHYLPREDNISOLONE SODIUM SUCC 40 MG IJ SOLR: 40.0000 mg | Freq: Two times a day (BID) | INTRAMUSCULAR | Status: DC

## 2022-02-22 NOTE — Progress Notes (Signed)
?PROGRESS NOTE ? ? ? ?Stacey CromerLisa [name]  ZOX:[id number] DOB: 04/07/1966 DOA: 02/19/2022 ?PCP: Eartha InchBadger, Michael C, MD  ? ?  ?Brief Narrative:  ?Stacey CromerLisa [name] is a 56 y.o. female with medical history significant of Wegener's granulomatosis who presented with severe and acute right hip pain. Patient reported being at her baseline until this am when she was not able to get out of the bed due to severe right hip pain. Worse with movement and no improving factors, associated with subjective fever but no diaphoresis. Because of severe symptoms she came to the hospital for further evaluation.  ? ?Her Wegener's granulomatosis has been uncontrolled with worsening upper airway symptoms, she follows with [facility] and has been restarted on Rituximab first dose on 02/16/22.  ? ?About 1 month ago she had right shoulder pain, that also occurred after waking up, it slowly resolved by itself, and was associated with right upper extremity paresthesias.  ?   ?It was initially thought that patient had partial right gluteus medius tendon tear which was shown on MRI.  Orthopedic surgery was consulted who felt that her clinical symptoms were indicative of myositis rather than a tear.  He has recommended physical therapy. ? ?New events last 24 hours / Subjective: ?Patient states that she continues to have some right-sided hip pain but improving.  Was able to walk with a walker yesterday.  Hoping to be able to go home tomorrow, she has family who will stay with her starting Thursday afternoon. ? ?Assessment & Plan: ?  ?Principal Problem: ?  Myositis of right lower extremity ?Active Problems: ?  Arthritis of right hip ?  AKI (acute kidney injury) (HCC) ?  Granulomatosis with polyangiitis (HCC) ?  GERD (gastroesophageal reflux disease) ?  Hypothyroidism ?  Depression ?  Essential hypertension ?  Class 2 obesity ?  HLD (hyperlipidemia) ?  Hip pain ? ? ?Myositis, right gluteus medius  ?-Appreciate orthopedic surgery ?-Wean  steroids ?-PT OT recommending home health ?-Cefepime --> Cefazolin  ? ?AKI ?-Resolved ? ?Granulomatosis with polyangiitis ?-Rituximab as outpatient ?-Prophylactic Bactrim ?-Followed by [facility], rheumatology ? ?Depression ?-Amitriptyline, duloxetine, trazodone, topiramate ? ?Hyperlipidemia ?-Crestor  ? ?Obesity ?-Estimated body mass index is 35.04 kg/m? as calculated from the following: ?  Height as of this encounter: 5\' 4"  (1.626 m). ?  Weight as of this encounter: 92.6 kg. ? ? ?DVT prophylaxis:  ?enoxaparin (LOVENOX) injection 40 mg Start: 02/19/22 2200 ?SCDs Start: 02/19/22 1948 ? ?Code Status: Full ?Family Communication: none at bedside  ?Disposition Plan:  ?Status is: Inpatient ?Remains inpatient appropriate because: IV steroids and antibiotics  ? ? ?Antimicrobials:  ?Anti-infectives (From admission, onward)  ? ? Start     Dose/Rate Route Frequency Ordered Stop  ? 02/20/22 1500  vancomycin (VANCOREADY) IVPB 1250 mg/250 mL  Status:  Discontinued       ? 1,250 mg ?166.7 mL/hr over 90 Minutes Intravenous Every 24 hours 02/19/22 1402 02/20/22 1108  ? 02/20/22 1400  ceFEPIme (MAXIPIME) 2 g in sodium chloride 0.9 % 100 mL IVPB  Status:  Discontinued       ? 2 g ?200 mL/hr over 30 Minutes Intravenous Every 8 hours 02/20/22 1221 02/22/22 1230  ? 02/20/22 0900  sulfamethoxazole-trimethoprim (BACTRIM) 400-80 MG per tablet 1 tablet       ?Note to Pharmacy: Monday,Wednesday and Friday    ? 1 tablet Oral Once per day on Mon Wed Fri 02/19/22 1947    ? 02/20/22 0200  ceFEPIme (MAXIPIME) 2 g in sodium  chloride 0.9 % 100 mL IVPB  Status:  Discontinued       ? 2 g ?200 mL/hr over 30 Minutes Intravenous Every 12 hours 02/19/22 1402 02/20/22 1221  ? 02/19/22 1400  vancomycin (VANCOREADY) IVPB 1500 mg/300 mL       ? 1,500 mg ?150 mL/hr over 120 Minutes Intravenous  Once 02/19/22 1329 02/19/22 1734  ? 02/19/22 1330  aztreonam (AZACTAM) 2 g in sodium chloride 0.9 % 100 mL IVPB  Status:  Discontinued       ? 2 g ?200 mL/hr  over 30 Minutes Intravenous  Once 02/19/22 1323 02/19/22 1329  ? 02/19/22 1330  metroNIDAZOLE (FLAGYL) IVPB 500 mg       ? 500 mg ?100 mL/hr over 60 Minutes Intravenous  Once 02/19/22 1323 02/19/22 1514  ? 02/19/22 1330  vancomycin (VANCOCIN) IVPB 1000 mg/200 mL premix  Status:  Discontinued       ? 1,000 mg ?200 mL/hr over 60 Minutes Intravenous  Once 02/19/22 1323 02/19/22 1329  ? 02/19/22 1330  ceFEPIme (MAXIPIME) 2 g in sodium chloride 0.9 % 100 mL IVPB       ? 2 g ?200 mL/hr over 30 Minutes Intravenous  Once 02/19/22 1329 02/19/22 1430  ? ?  ? ? ? ?Objective: ?Vitals:  ? 02/21/22 0604 02/21/22 1400 02/21/22 2126 02/22/22 0520  ?BP: 139/86 129/72 122/78 (!) 145/81  ?Pulse: 70 76 70 62  ?Resp: 18 18 20 19   ?Temp: 98.1 ?F (36.7 ?C) 98.6 ?F (37 ?C) 97.8 ?F (36.6 ?C) 98 ?F (36.7 ?C)  ?TempSrc: Oral Oral Oral   ?SpO2: 95% 95% 93% 96%  ?Weight:      ?Height:      ? ? ?Intake/Output Summary (Last 24 hours) at 02/22/2022 1232 ?Last data filed at 02/22/2022 04/24/2022 ?Gross per 24 hour  ?Intake 480 ml  ?Output 700 ml  ?Net -220 ml  ? ?Filed Weights  ? 02/19/22 1253 02/19/22 1940  ?Weight: 88.5 kg 92.6 kg  ? ? ?Examination:  ?General exam: Appears calm and comfortable  ?Respiratory system: Clear to auscultation. Respiratory effort normal. No respiratory distress. No conversational dyspnea.  ?Cardiovascular system: S1 & S2 heard, RRR. No murmurs. No pedal edema. ?Gastrointestinal system: Abdomen is nondistended, soft and nontender. Normal bowel sounds heard. ?Central nervous system: Alert and oriented. No focal neurological deficits. Speech clear.  ?Extremities: Symmetric in appearance, TTP right hip  ?Skin: No rashes, lesions or ulcers on exposed skin  ?Psychiatry: Judgement and insight appear normal. Mood & affect appropriate.  ? ?Data Reviewed: I have personally reviewed following labs and imaging studies ? ?CBC: ?Recent Labs  ?Lab 02/19/22 ?1306 02/20/22 ?0505 02/22/22 ?04/24/22  ?WBC 21.8* 15.3* 11.5*  ?NEUTROABS 16.7*  --   --    ?HGB 10.1* 8.6* 8.2*  ?HCT 34.1* 30.0* 29.1*  ?MCV 83.2 84.3 85.8  ?PLT 355 296 304  ? ?Basic Metabolic Panel: ?Recent Labs  ?Lab 02/19/22 ?1306 02/20/22 ?0505  ?NA 135 140  ?K 4.0 4.1  ?CL 102 112*  ?CO2 22 24  ?GLUCOSE 146* 215*  ?BUN 22* 20  ?CREATININE 1.39* 0.89  ?CALCIUM 8.3* 7.9*  ? ?GFR: ?Estimated Creatinine Clearance: 77.9 mL/min (by C-G formula based on SCr of 0.89 mg/dL). ?Liver Function Tests: ?Recent Labs  ?Lab 02/19/22 ?1306  ?AST 85*  ?ALT 34  ?ALKPHOS 108  ?BILITOT 0.5  ?PROT 6.4*  ?ALBUMIN 3.2*  ? ?No results for input(s): LIPASE, AMYLASE in the last 168 hours. ?No results for input(s):  AMMONIA in the last 168 hours. ?Coagulation Profile: ?Recent Labs  ?Lab 02/19/22 ?1306  ?INR 1.0  ? ?Cardiac Enzymes: ?Recent Labs  ?Lab 02/21/22 ?1034  ?CKTOTAL 868*  ?CKMB 8.4*  ? ?BNP (last 3 results) ?No results for input(s): PROBNP in the last 8760 hours. ?HbA1C: ?No results for input(s): HGBA1C in the last 72 hours. ?CBG: ?Recent Labs  ?Lab 02/19/22 ?2338  ?GLUCAP 266*  ? ?Lipid Profile: ?No results for input(s): CHOL, HDL, LDLCALC, TRIG, CHOLHDL, LDLDIRECT in the last 72 hours. ?Thyroid Function Tests: ?No results for input(s): TSH, T4TOTAL, FREET4, T3FREE, THYROIDAB in the last 72 hours. ?Anemia Panel: ?No results for input(s): VITAMINB12, FOLATE, FERRITIN, TIBC, IRON, RETICCTPCT in the last 72 hours. ?Sepsis Labs: ?Recent Labs  ?Lab 02/19/22 ?1306 02/19/22 ?1511  ?LATICACIDVEN 2.2* 1.2  ? ? ?Recent Results (from the past 240 hour(s))  ?Blood Culture (routine x 2)     Status: None (Preliminary result)  ? Collection Time: 02/19/22  1:10 PM  ? Specimen: BLOOD  ?Result Value Ref Range Status  ? Specimen Description BLOOD LEFT ANTECUBITAL  Final  ? Special Requests   Final  ?  BOTTLES DRAWN AEROBIC AND ANAEROBIC Blood Culture adequate volume  ? Culture   Final  ?  NO GROWTH 1 DAY ?Performed at [facility], [address] [place], [place] [address] ?  ? Report Status PENDING  Incomplete  ?Blood Culture (routine  x 2)     Status: None (Preliminary result)  ? Collection Time: 02/19/22  1:12 PM  ? Specimen: BLOOD  ?Result Value Ref Range Status  ? Specimen Description BLOOD BLOOD RIGHT FOREARM  Final  ? Special Requests   Fi

## 2022-02-22 NOTE — Progress Notes (Signed)
Pharmacy Antibiotic Note ? ?[name] is a 56 y.o. female admitted on 02/19/2022 with  unknown source .  Pharmacy has been consulted for cefepime dosing. ? ?Plan: ?Cefepime 2gm IV q12h ?F/U cxs and clinical progress ?Monitor V/S, labs, and levels as indicated ? ?Height: 5\' 4"  (162.6 cm) ?Weight: 92.6 kg (204 lb 2.3 oz) ?IBW/kg (Calculated) : 54.7 ? ?Temp (24hrs), Avg:98.1 ?F (36.7 ?C), Min:97.8 ?F (36.6 ?C), Max:98.6 ?F (37 ?C) ? ?Recent Labs  ?Lab 02/19/22 ?1306 02/19/22 ?1511 02/20/22 ?0505 02/22/22 ?04/24/22  ?WBC 21.8*  --  15.3* 11.5*  ?CREATININE 1.39*  --  0.89  --   ?LATICACIDVEN 2.2* 1.2  --   --   ? ?  ?Estimated Creatinine Clearance: 77.9 mL/min (by C-G formula based on SCr of 0.89 mg/dL).   ? ?Allergies  ?Allergen Reactions  ? Ibuprofen Other (See Comments)  ?  Pt is unable to take this due to kidney problems.   ?  ? Penicillins Rash and Other (See Comments)  ?  Has patient had a PCN reaction causing immediate rash, facial/tongue/throat swelling, SOB or lightheadedness with hypotension: No ?Has patient had a PCN reaction causing severe rash involving mucus membranes or skin necrosis: No ?Has patient had a PCN reaction that required hospitalization No ?Has patient had a PCN reaction occurring within the last 10 years: No ?If all of the above answers are "NO", then may proceed with Cephalosporin use.  ? ? ?Antimicrobials this admission: ?Vancomycin 4/30 x 1 ?cefepime 4/30 >> ?Flagyl 4/30 x 1  ? ?Microbiology results: ?4/30 BCx: pending ?5/2 Bcx: NGTD  ?4/30 UCx: pending  ?4/30 MRSA PCR: Positive  ? ?Thank you for allowing pharmacy to be a part of this patient?s care. ? ?5/30, PharmD, MBA ?Clinical Pharmacist ? ?02/22/2022 8:56 AM ? ?

## 2022-02-22 NOTE — Progress Notes (Signed)
Pharmacy Antibiotic Note ? ?[name] a 56 y.o. female admitted on 02/22/2022 with  myositis .  Pharmacy has been consulted for cefazolin dosing. ? ?Plan: ?Cefazolin 2gm iv q8h ? ?Medical History: ?Past Medical History:  ?Diagnosis Date  ? Chronic cough   ? Chronic pain   ? Diabetes mellitus without complication (HCC)   ? Dyspnea   ? GERD (gastroesophageal reflux disease)   ? Hypokalemia   ? Hypothyroidism   ? Left wrist pain 06/01/2021  ? Myonecrosis (HCC) 07/01/2021  ? Neurocardiogenic syncope   ? OSA (obstructive sleep apnea)   ? does not use CPAP  ? Osteomyelitis (HCC)   ? bilateral feet  ? Peripheral neuropathy   ? Presence of intrathecal pump   ? recieves Prialt/bupivicaine  ? Tear of gluteus medius tendon 07/01/2021  ? Wegener's granulomatosis 2009  ? Wegner's disease (congenital syphilitic osteochondritis)   ? ? ?Allergies:  ?Allergies  ?Allergen Reactions  ? Ibuprofen Other (See Comments)  ?  Pt is unable to take this due to kidney problems.   ?  ? Penicillins Rash and Other (See Comments)  ?  Has patient had a PCN reaction causing immediate rash, facial/tongue/throat swelling, SOB or lightheadedness with hypotension: No ?Has patient had a PCN reaction causing severe rash involving mucus membranes or skin necrosis: No ?Has patient had a PCN reaction that required hospitalization No ?Has patient had a PCN reaction occurring within the last 10 years: No ?If all of the above answers are "NO", then may proceed with Cephalosporin use.  ? ? ?Filed Weights  ? 02/19/22 1253 02/19/22 1940  ?Weight: 88.5 kg (195 lb) 92.6 kg (204 lb 2.3 oz)  ? ? ? ?  Latest Ref Rng & Units 02/22/2022  ?  5:55 AM 02/20/2022  ?  5:05 AM 02/19/2022  ?  1:06 PM  ?CBC  ?WBC 4.0 - 10.5 K/uL 11.5   15.3   21.8    ?Hemoglobin 12.0 - 15.0 g/dL 8.2   8.6   33.8    ?Hematocrit 36.0 - 46.0 % 29.1   30.0   34.1    ?Platelets 150 - 400 K/uL 304   296   355    ? ? ? ?Estimated Creatinine Clearance: 77.9 mL/min (by C-G formula based on SCr of 0.89  mg/dL). ? ?Antibiotics Given (last 72 hours)   ? ? Date/Time Action Medication Dose Rate  ? 02/19/22 1348 New Bag/Given  ? vancomycin (VANCOREADY) IVPB 1500 mg/300 mL 1,500 mg 150 mL/hr  ? 02/19/22 1350 New Bag/Given  ? ceFEPIme (MAXIPIME) 2 g in sodium chloride 0.9 % 100 mL IVPB 2 g 200 mL/hr  ? 02/19/22 1352 New Bag/Given  ? metroNIDAZOLE (FLAGYL) IVPB 500 mg 500 mg 100 mL/hr  ? 02/20/22 0152 New Bag/Given  ? ceFEPIme (MAXIPIME) 2 g in sodium chloride 0.9 % 100 mL IVPB 2 g 200 mL/hr  ? 02/20/22 0936 Given  ? sulfamethoxazole-trimethoprim (BACTRIM) 400-80 MG per tablet 1 tablet 1 tablet   ? 02/20/22 1238 New Bag/Given  ? ceFEPIme (MAXIPIME) 2 g in sodium chloride 0.9 % 100 mL IVPB 2 g 200 mL/hr  ? 02/20/22 2159 New Bag/Given  ? ceFEPIme (MAXIPIME) 2 g in sodium chloride 0.9 % 100 mL IVPB 2 g 200 mL/hr  ? 02/21/22 1456 New Bag/Given  ? ceFEPIme (MAXIPIME) 2 g in sodium chloride 0.9 % 100 mL IVPB 2 g 200 mL/hr  ? 02/21/22 2155 New Bag/Given  ? ceFEPIme (MAXIPIME) 2 g in sodium chloride  0.9 % 100 mL IVPB 2 g 200 mL/hr  ? 02/22/22 5456 New Bag/Given  ? ceFEPIme (MAXIPIME) 2 g in sodium chloride 0.9 % 100 mL IVPB 2 g 200 mL/hr  ? 02/22/22 0945 Given  ? sulfamethoxazole-trimethoprim (BACTRIM) 400-80 MG per tablet 1 tablet 1 tablet   ? ?  ? ? ?Antimicrobials this admission: ? ?Cefazolin 02/22/2022  >>  ?Cefepime 4/30 >> 5/3 ?Vancomycin  4/30 x 1  ?Metronidazole 4/30 x 1  ?Sulfamethoxazole/trimethoprim 5/1>> ? ?Microbiology results: ?02/19/2022  BCx: NGTD ?02/21/2022  Resp Panel: negative  ?02/19/2022  MRSA PCR: positive  ? ?Thank you for allowing pharmacy to be a part of this patient?s care. ? ?Luan Pulling, PharmD ?Clinical Pharmacist ? ? ?

## 2022-02-23 DIAGNOSIS — M609 Myositis, unspecified: Secondary | ICD-10-CM | POA: Diagnosis not present

## 2022-02-23 LAB — CULTURE, RESPIRATORY W GRAM STAIN: Gram Stain: NONE SEEN

## 2022-02-23 MED ORDER — OXYCODONE HCL 10 MG PO TABS: 10.0000 mg | ORAL_TABLET | ORAL | 0 refills | Status: DC | PRN

## 2022-02-23 MED ORDER — CLINDAMYCIN HCL 300 MG PO CAPS: 300.0000 mg | ORAL_CAPSULE | Freq: Three times a day (TID) | ORAL | 0 refills | Status: AC

## 2022-02-23 MED ORDER — PREDNISONE 10 MG PO TABS: ORAL_TABLET | ORAL | 0 refills | Status: DC

## 2022-02-23 NOTE — Discharge Summary (Signed)
Physician Discharge Summary  ?[name] VOJ:[id number] DOB: 01/15/1966 DOA: 02/19/2022 ? ?PCP: Chesley Noon, MD ? ?Admit date: 02/19/2022 ?Discharge date: 02/23/2022 ? ?Admitted From: Home ?Disposition:  Home with home health  ? ?Recommendations for Outpatient Follow-up:  ?Follow up with PCP in 1 week ?Follow up with orthopedic surgery as needed ? ?Discharge Condition: Stable, improved ?CODE STATUS: Full code ?Diet recommendation:  ?Diet Orders (From admission, onward)  ? ?  Start     Ordered  ? 02/19/22 1948  Diet regular Room service appropriate? Yes; Fluid consistency: Thin  Diet effective now       ?Question Answer Comment  ?Room service appropriate? Yes   ?Fluid consistency: Thin   ?  ? 02/19/22 1947  ? ?  ?  ? ?  ? ?Brief/Interim Summary: ?[name] is a 56 y.o. female with medical history significant of Wegener's granulomatosis who presented with severe and acute right hip pain. Patient reported being at her baseline until this am when she was not able to get out of the bed due to severe right hip pain. Worse with movement and no improving factors, associated with subjective fever but no diaphoresis. Because of severe symptoms she came to the hospital for further evaluation.  ?  ?Her Wegener's granulomatosis has been uncontrolled with worsening upper airway symptoms, she follows with [facility] and has been restarted on Rituximab first dose on 02/16/22.  ?  ?About 1 month ago she had right shoulder pain, that also occurred after waking up, it slowly resolved by itself, and was associated with right upper extremity paresthesias.  ?   ?It was initially thought that patient had partial right gluteus medius tendon tear which was shown on MRI.  Orthopedic surgery was consulted who felt that her clinical symptoms were indicative of myositis rather than a tear.  He has recommended physical therapy. ? ?Discharge Diagnoses:  ? ?Principal Problem: ?  Myositis of right lower extremity ?Active  Problems: ?  Arthritis of right hip ?  AKI (acute kidney injury) ([place]) ?  Granulomatosis with polyangiitis ([place]) ?  GERD (gastroesophageal reflux disease) ?  Hypothyroidism ?  Depression ?  Essential hypertension ?  Class 2 obesity ?  HLD (hyperlipidemia) ?  Hip pain ? ?Myositis, right gluteus medius  ?-Appreciate orthopedic surgery ?-Wean steroids ?-PT OT recommending home health ?-Cefepime --> Cefazolin --> Clindamycin (will cover pyomyositis as well as MRSA as below)  ?  ?AKI ?-Resolved ?  ?Granulomatosis with polyangiitis ?-Rituximab as outpatient ?-Prophylactic Bactrim ?-Followed by [facility], rheumatology ?  ?Depression ?-Amitriptyline, duloxetine, trazodone, topiramate ?  ?Hyperlipidemia ?-Crestor  ?  ?Obesity ?-Estimated body mass index is 35.04 kg/m? as calculated from the following: ?  Height as of this encounter: $RemoveBeforeD'5\' 4"'xXerBkBeQdxhiz$  (1.626 m). ?  Weight as of this encounter: 92.6 kg. ? ?MRSA in sputum ?-?Significance. CXR showed improved RUL opacity ?-Followed by Pulmonology outpatient for chronic cough, had bronch 01/09/22 which showed normal flora and candida (contamination) ?-Blood cultures negative  ?-Clindamycin as above ? ?Discharge Instructions ? ?Discharge Instructions   ? ? Call MD for:  difficulty breathing, headache or visual disturbances   Complete by: As directed ?  ? Call MD for:  extreme fatigue   Complete by: As directed ?  ? Call MD for:  hives   Complete by: As directed ?  ? Call MD for:  persistant dizziness or light-headedness   Complete by: As directed ?  ? Call MD for:  persistant nausea and vomiting  Complete by: As directed ?  ? Call MD for:  severe uncontrolled pain   Complete by: As directed ?  ? Call MD for:  temperature >100.4   Complete by: As directed ?  ? Discharge instructions   Complete by: As directed ?  ? You were cared for by a hospitalist during your hospital stay. If you have any questions about your discharge medications or the care you received while you were in the  hospital after you are discharged, you can call the unit and ask to speak with the hospitalist on call if the hospitalist that took care of you is not available. Once you are discharged, your primary care physician will handle any further medical issues. Please note that NO REFILLS for any discharge medications will be authorized once you are discharged, as it is imperative that you return to your primary care physician (or establish a relationship with a primary care physician if you do not have one) for your aftercare needs so that they can reassess your need for medications and monitor your lab values.  ? Increase activity slowly   Complete by: As directed ?  ? ?  ? ?Allergies as of 02/23/2022   ? ?   Reactions  ? Ibuprofen Other (See Comments)  ? Pt is unable to take this due to kidney problems.    ? Penicillins Rash, Other (See Comments)  ? Has patient had a PCN reaction causing immediate rash, facial/tongue/throat swelling, SOB or lightheadedness with hypotension: No ?Has patient had a PCN reaction causing severe rash involving mucus membranes or skin necrosis: No ?Has patient had a PCN reaction that required hospitalization No ?Has patient had a PCN reaction occurring within the last 10 years: No ?If all of the above answers are "NO", then may proceed with Cephalosporin use.  ? ?  ? ?  ?Medication List  ?  ? ?STOP taking these medications   ? ?promethazine-dextromethorphan 6.25-15 MG/5ML syrup ?Commonly known as: PROMETHAZINE-DM ?  ? ?  ? ?TAKE these medications   ? ?acetaminophen 325 MG tablet ?Commonly known as: TYLENOL ?Take 2 tablets (650 mg total) by mouth every 6 (six) hours as needed for mild pain (or Fever >/= 101). ?  ?albuterol 108 (90 Base) MCG/ACT inhaler ?Commonly known as: VENTOLIN HFA ?Inhale 2 puffs into the lungs every 6 (six) hours as needed for wheezing or shortness of breath. ?  ?amitriptyline 50 MG tablet ?Commonly known as: ELAVIL ?Take 50 mg by mouth at bedtime. ?  ?CALCIUM  CARBONATE-VITAMIN D3 PO ?Take 1 tablet by mouth daily. ?  ?clindamycin 300 MG capsule ?Commonly known as: CLEOCIN ?Take 1 capsule (300 mg total) by mouth 3 (three) times daily for 10 days. ?  ?DULoxetine 60 MG capsule ?Commonly known as: CYMBALTA ?Take 60 mg by mouth daily. ?  ?folic acid 1 MG tablet ?Commonly known as: FOLVITE ?Take 2 mg by mouth daily. ?  ?gabapentin 300 MG capsule ?Commonly known as: NEURONTIN ?Take 600-1,200 mg by mouth See admin instructions. Take 3 (900 mg total) capsules by mouth in the morning, 2 (600 mg) capsule in the afternoon and 4 (1200 mg) capsules in the evening. ?  ?K-DUR PO ?Take 40 mEq by mouth every other day. ?  ?lidocaine 5 % ?Commonly known as: LIDODERM ?Place 1 patch onto the skin daily as needed (for pain). Remove & Discard patch within 12 hours or as directed by MD ?  ?linaclotide 145 MCG Caps capsule ?Commonly known as: LINZESS ?Take  145 mcg by mouth daily before breakfast. ?  ?Melatonin 10 MG Tabs ?Take 10 mg by mouth at bedtime as needed (sleep). ?  ?METHOTREXATE SODIUM IJ ?Inject 25 mg into the skin every Sunday. ?  ?naloxone 4 MG/0.1ML Liqd nasal spray kit ?Commonly known as: NARCAN ?Place 0.4 mg into the nose once. ?  ?Oxycodone HCl 10 MG Tabs ?Take 1 tablet (10 mg total) by mouth every 4 (four) hours as needed for severe pain or breakthrough pain. ?  ?pantoprazole 40 MG tablet ?Commonly known as: PROTONIX ?Take 40 mg by mouth daily. ?  ?predniSONE 10 MG tablet ?Commonly known as: DELTASONE ?Take 4 tabs ($Remov'40mg'HaPzUc$ ) daily for 2 days, then reduce to your home dose of $Remov'5mg'MCMcGl$  daily ?What changed:  ?medication strength ?how much to take ?how to take this ?when to take this ?additional instructions ?  ?propranolol 40 MG tablet ?Commonly known as: INDERAL ?Take 40 mg by mouth daily. ?  ?rosuvastatin 10 MG tablet ?Commonly known as: CRESTOR ?Take 10 mg by mouth at bedtime. ?  ?senna 8.6 MG Tabs tablet ?Commonly known as: SENOKOT ?Take 2 tablets (17.2 mg total) by mouth 2 (two) times  daily. ?  ?sulfamethoxazole-trimethoprim 400-80 MG tablet ?Commonly known as: BACTRIM ?Take 1 tablet by mouth 3 (three) times a week. Monday,Wednesday and Friday ?  ?tapentadol HCl 75 MG tablet ?Commonly known as: NUCYNTA ?T

## 2022-02-23 NOTE — TOC Transition Note (Signed)
Transition of Care (TOC) - CM/SW Discharge Note ? ? ?Patient Details  ?Name: [name] ?MRN: [id number] ?Date of Birth: 20-Jun-1966 ? ?Transition of Care (TOC) CM/SW Contact:  ?Elliot Gault, LCSW ?Phone Number: ?02/23/2022, 11:45 AM ? ? ?Clinical Narrative:    ? ?Pt stable for dc home with [facility] today per MD. Updated Kandee Keen at [facility] information added to pt's AVS.  ? ?There are no other TOC needs identified for dc. ? ?Final next level of care: Home w Home Health Services ?Barriers to Discharge: Barriers Resolved ? ? ?Patient Goals and CMS Choice ?Patient states their goals for this hospitalization and ongoing recovery are:: Home with HH ?CMS Medicare.gov Compare Post Acute Care list provided to:: Patient ?Choice offered to / list presented to : Patient ? ?Discharge Placement ?  ?           ?  ?  ?  ?  ? ?Discharge Plan and Services ?In-house Referral: Clinical Social Work ?Discharge Planning Services: CM Consult ?Post Acute Care Choice: Home Health          ?  ?  ?  ?  ?  ?HH Arranged: PT ?HH Agency: [facility] ?Date HH Agency Contacted: 02/20/22 ?  ?  ? ?Social Determinants of Health (SDOH) Interventions ?  ? ? ?Readmission Risk Interventions ? ?  02/20/2022  ?  1:49 PM  ?Readmission Risk Prevention Plan  ?Transportation Screening Complete  ?HRI or Home Care Consult Complete  ?Social Work Consult for Recovery Care Planning/Counseling Complete  ?Palliative Care Screening Not Applicable  ?Medication Review Oceanographer) Complete  ? ? ? ? ? ?

## 2022-02-24 LAB — CULTURE, BLOOD (ROUTINE X 2)
Culture: NO GROWTH
Culture: NO GROWTH
Special Requests: ADEQUATE

## 2022-02-24 LAB — CYTOLOGY - NON PAP

## 2022-03-07 ENCOUNTER — Ambulatory Visit (HOSPITAL_COMMUNITY): Payer: BC Managed Care – PPO | Attending: Orthopedic Surgery | Admitting: Physical Therapy

## 2022-03-26 ENCOUNTER — Inpatient Hospital Stay (HOSPITAL_COMMUNITY)
Admission: EM | Admit: 2022-03-26 | Discharge: 2022-04-05 | DRG: 853 | Disposition: A | Discharge status: Home Health | Payer: BC Managed Care – PPO | Attending: Internal Medicine | Admitting: Internal Medicine

## 2022-03-26 ENCOUNTER — Emergency Department (HOSPITAL_COMMUNITY): Payer: BC Managed Care – PPO

## 2022-03-26 ENCOUNTER — Encounter (HOSPITAL_COMMUNITY): Payer: Self-pay | Admitting: Emergency Medicine

## 2022-03-26 ENCOUNTER — Other Ambulatory Visit: Payer: Self-pay

## 2022-03-26 DIAGNOSIS — D509 Iron deficiency anemia, unspecified: Secondary | ICD-10-CM | POA: Diagnosis present

## 2022-03-26 DIAGNOSIS — M7989 Other specified soft tissue disorders: Secondary | ICD-10-CM | POA: Diagnosis not present

## 2022-03-26 DIAGNOSIS — T380X5A Adverse effect of glucocorticoids and synthetic analogues, initial encounter: Secondary | ICD-10-CM | POA: Diagnosis present

## 2022-03-26 DIAGNOSIS — E273 Drug-induced adrenocortical insufficiency: Secondary | ICD-10-CM | POA: Diagnosis present

## 2022-03-26 DIAGNOSIS — Z6835 Body mass index (BMI) 35.0-35.9, adult: Secondary | ICD-10-CM | POA: Diagnosis not present

## 2022-03-26 DIAGNOSIS — G8929 Other chronic pain: Secondary | ICD-10-CM | POA: Diagnosis present

## 2022-03-26 DIAGNOSIS — N1831 Chronic kidney disease, stage 3a: Secondary | ICD-10-CM

## 2022-03-26 DIAGNOSIS — I2699 Other pulmonary embolism without acute cor pulmonale: Secondary | ICD-10-CM

## 2022-03-26 DIAGNOSIS — R2243 Localized swelling, mass and lump, lower limb, bilateral: Secondary | ICD-10-CM | POA: Diagnosis not present

## 2022-03-26 DIAGNOSIS — E2749 Other adrenocortical insufficiency: Secondary | ICD-10-CM | POA: Diagnosis not present

## 2022-03-26 DIAGNOSIS — I129 Hypertensive chronic kidney disease with stage 1 through stage 4 chronic kidney disease, or unspecified chronic kidney disease: Secondary | ICD-10-CM | POA: Diagnosis present

## 2022-03-26 DIAGNOSIS — E669 Obesity, unspecified: Secondary | ICD-10-CM

## 2022-03-26 DIAGNOSIS — M313 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: Secondary | ICD-10-CM | POA: Diagnosis present

## 2022-03-26 DIAGNOSIS — E66812 Obesity, class 2: Secondary | ICD-10-CM | POA: Diagnosis present

## 2022-03-26 DIAGNOSIS — E1142 Type 2 diabetes mellitus with diabetic polyneuropathy: Secondary | ICD-10-CM | POA: Diagnosis present

## 2022-03-26 DIAGNOSIS — J189 Pneumonia, unspecified organism: Principal | ICD-10-CM

## 2022-03-26 DIAGNOSIS — E039 Hypothyroidism, unspecified: Secondary | ICD-10-CM | POA: Diagnosis present

## 2022-03-26 DIAGNOSIS — G928 Other toxic encephalopathy: Secondary | ICD-10-CM | POA: Diagnosis present

## 2022-03-26 DIAGNOSIS — Z79899 Other long term (current) drug therapy: Secondary | ICD-10-CM

## 2022-03-26 DIAGNOSIS — D849 Immunodeficiency, unspecified: Secondary | ICD-10-CM | POA: Diagnosis present

## 2022-03-26 DIAGNOSIS — Z880 Allergy status to penicillin: Secondary | ICD-10-CM | POA: Diagnosis not present

## 2022-03-26 DIAGNOSIS — Z8249 Family history of ischemic heart disease and other diseases of the circulatory system: Secondary | ICD-10-CM

## 2022-03-26 DIAGNOSIS — T451X5A Adverse effect of antineoplastic and immunosuppressive drugs, initial encounter: Secondary | ICD-10-CM

## 2022-03-26 DIAGNOSIS — R7989 Other specified abnormal findings of blood chemistry: Secondary | ICD-10-CM | POA: Diagnosis not present

## 2022-03-26 DIAGNOSIS — E1165 Type 2 diabetes mellitus with hyperglycemia: Secondary | ICD-10-CM | POA: Diagnosis present

## 2022-03-26 DIAGNOSIS — E1122 Type 2 diabetes mellitus with diabetic chronic kidney disease: Secondary | ICD-10-CM | POA: Diagnosis present

## 2022-03-26 DIAGNOSIS — G9341 Metabolic encephalopathy: Secondary | ICD-10-CM | POA: Diagnosis not present

## 2022-03-26 DIAGNOSIS — Y929 Unspecified place or not applicable: Secondary | ICD-10-CM | POA: Diagnosis not present

## 2022-03-26 DIAGNOSIS — Z89421 Acquired absence of other right toe(s): Secondary | ICD-10-CM

## 2022-03-26 DIAGNOSIS — R0902 Hypoxemia: Secondary | ICD-10-CM | POA: Diagnosis present

## 2022-03-26 DIAGNOSIS — R6520 Severe sepsis without septic shock: Secondary | ICD-10-CM

## 2022-03-26 DIAGNOSIS — F1120 Opioid dependence, uncomplicated: Secondary | ICD-10-CM | POA: Diagnosis present

## 2022-03-26 DIAGNOSIS — Z8701 Personal history of pneumonia (recurrent): Secondary | ICD-10-CM | POA: Diagnosis not present

## 2022-03-26 DIAGNOSIS — J181 Lobar pneumonia, unspecified organism: Secondary | ICD-10-CM

## 2022-03-26 DIAGNOSIS — R319 Hematuria, unspecified: Secondary | ICD-10-CM

## 2022-03-26 DIAGNOSIS — R4182 Altered mental status, unspecified: Secondary | ICD-10-CM

## 2022-03-26 DIAGNOSIS — A419 Sepsis, unspecified organism: Secondary | ICD-10-CM | POA: Diagnosis not present

## 2022-03-26 DIAGNOSIS — E11621 Type 2 diabetes mellitus with foot ulcer: Secondary | ICD-10-CM | POA: Diagnosis present

## 2022-03-26 DIAGNOSIS — Z7984 Long term (current) use of oral hypoglycemic drugs: Secondary | ICD-10-CM

## 2022-03-26 DIAGNOSIS — Z20822 Contact with and (suspected) exposure to covid-19: Secondary | ICD-10-CM | POA: Diagnosis present

## 2022-03-26 DIAGNOSIS — K219 Gastro-esophageal reflux disease without esophagitis: Secondary | ICD-10-CM | POA: Diagnosis present

## 2022-03-26 DIAGNOSIS — G620 Drug-induced polyneuropathy: Secondary | ICD-10-CM | POA: Diagnosis present

## 2022-03-26 DIAGNOSIS — L97519 Non-pressure chronic ulcer of other part of right foot with unspecified severity: Secondary | ICD-10-CM | POA: Diagnosis present

## 2022-03-26 DIAGNOSIS — Z89422 Acquired absence of other left toe(s): Secondary | ICD-10-CM

## 2022-03-26 DIAGNOSIS — Z978 Presence of other specified devices: Secondary | ICD-10-CM | POA: Diagnosis not present

## 2022-03-26 DIAGNOSIS — Y95 Nosocomial condition: Secondary | ICD-10-CM | POA: Diagnosis present

## 2022-03-26 DIAGNOSIS — E785 Hyperlipidemia, unspecified: Secondary | ICD-10-CM | POA: Diagnosis present

## 2022-03-26 DIAGNOSIS — G4733 Obstructive sleep apnea (adult) (pediatric): Secondary | ICD-10-CM | POA: Diagnosis present

## 2022-03-26 DIAGNOSIS — I10 Essential (primary) hypertension: Secondary | ICD-10-CM | POA: Diagnosis not present

## 2022-03-26 DIAGNOSIS — Z833 Family history of diabetes mellitus: Secondary | ICD-10-CM

## 2022-03-26 DIAGNOSIS — Z7952 Long term (current) use of systemic steroids: Secondary | ICD-10-CM | POA: Diagnosis not present

## 2022-03-26 DIAGNOSIS — N390 Urinary tract infection, site not specified: Secondary | ICD-10-CM | POA: Diagnosis present

## 2022-03-26 DIAGNOSIS — B379 Candidiasis, unspecified: Secondary | ICD-10-CM | POA: Diagnosis present

## 2022-03-26 LAB — CBC WITH DIFFERENTIAL/PLATELET
Abs Immature Granulocytes: 0.21 10*3/uL — ABNORMAL HIGH (ref 0.00–0.07)
Basophils Absolute: 0 10*3/uL (ref 0.0–0.1)
Basophils Relative: 0 %
Eosinophils Absolute: 0.1 10*3/uL (ref 0.0–0.5)
Eosinophils Relative: 1 %
HCT: 33.4 % — ABNORMAL LOW (ref 36.0–46.0)
Hemoglobin: 9.8 g/dL — ABNORMAL LOW (ref 12.0–15.0)
Immature Granulocytes: 2 %
Lymphocytes Relative: 20 %
Lymphs Abs: 2.6 10*3/uL (ref 0.7–4.0)
MCH: 25.6 pg — ABNORMAL LOW (ref 26.0–34.0)
MCHC: 29.3 g/dL — ABNORMAL LOW (ref 30.0–36.0)
MCV: 87.2 fL (ref 80.0–100.0)
Monocytes Absolute: 0.8 10*3/uL (ref 0.1–1.0)
Monocytes Relative: 6 %
Neutro Abs: 9.6 10*3/uL — ABNORMAL HIGH (ref 1.7–7.7)
Neutrophils Relative %: 71 %
Platelets: 247 10*3/uL (ref 150–400)
RBC: 3.83 MIL/uL — ABNORMAL LOW (ref 3.87–5.11)
RDW: 22.2 % — ABNORMAL HIGH (ref 11.5–15.5)
WBC: 13.3 10*3/uL — ABNORMAL HIGH (ref 4.0–10.5)
nRBC: 0.5 % — ABNORMAL HIGH (ref 0.0–0.2)

## 2022-03-26 LAB — COMPREHENSIVE METABOLIC PANEL
ALT: 18 U/L (ref 0–44)
AST: 27 U/L (ref 15–41)
Albumin: 2.6 g/dL — ABNORMAL LOW (ref 3.5–5.0)
Alkaline Phosphatase: 87 U/L (ref 38–126)
Anion gap: 5 (ref 5–15)
BUN: 12 mg/dL (ref 6–20)
CO2: 22 mmol/L (ref 22–32)
Calcium: 7.9 mg/dL — ABNORMAL LOW (ref 8.9–10.3)
Chloride: 114 mmol/L — ABNORMAL HIGH (ref 98–111)
Creatinine, Ser: 1.11 mg/dL — ABNORMAL HIGH (ref 0.44–1.00)
GFR, Estimated: 58 mL/min — ABNORMAL LOW (ref >60)
Glucose, Bld: 140 mg/dL — ABNORMAL HIGH (ref 70–99)
Potassium: 3.6 mmol/L (ref 3.5–5.1)
Sodium: 141 mmol/L (ref 135–145)
Total Bilirubin: 0.4 mg/dL (ref 0.3–1.2)
Total Protein: 5.1 g/dL — ABNORMAL LOW (ref 6.5–8.1)

## 2022-03-26 LAB — URINALYSIS, ROUTINE W REFLEX MICROSCOPIC
Bilirubin Urine: NEGATIVE
Glucose, UA: NEGATIVE mg/dL
Hgb urine dipstick: NEGATIVE
Ketones, ur: NEGATIVE mg/dL
Nitrite: POSITIVE — AB
Protein, ur: NEGATIVE mg/dL
Specific Gravity, Urine: 1.006 (ref 1.005–1.030)
WBC, UA: >50 WBC/hpf — ABNORMAL HIGH (ref 0–5)
pH: 6 (ref 5.0–8.0)

## 2022-03-26 LAB — CBG MONITORING, ED: Glucose-Capillary: 137 mg/dL — ABNORMAL HIGH (ref 70–99)

## 2022-03-26 LAB — PROTIME-INR
INR: 1.1 (ref 0.8–1.2)
Prothrombin Time: 14.1 seconds (ref 11.4–15.2)

## 2022-03-26 LAB — BLOOD GAS, VENOUS
Acid-base deficit: 4.8 mmol/L — ABNORMAL HIGH (ref 0.0–2.0)
Bicarbonate: 21.6 mmol/L (ref 20.0–28.0)
Drawn by: 7012
O2 Saturation: 68.5 %
Patient temperature: 39.7
pCO2, Ven: 50 mmHg (ref 44–60)
pH, Ven: 7.26 (ref 7.25–7.43)
pO2, Ven: 51 mmHg — ABNORMAL HIGH (ref 32–45)

## 2022-03-26 LAB — MRSA NEXT GEN BY PCR, NASAL: MRSA by PCR Next Gen: NOT DETECTED

## 2022-03-26 LAB — GLUCOSE, CAPILLARY
Glucose-Capillary: 218 mg/dL — ABNORMAL HIGH (ref 70–99)
Glucose-Capillary: 227 mg/dL — ABNORMAL HIGH (ref 70–99)

## 2022-03-26 LAB — HEMOGLOBIN A1C
Hgb A1c MFr Bld: 8.6 % — ABNORMAL HIGH (ref 4.8–5.6)
Mean Plasma Glucose: 200.12 mg/dL

## 2022-03-26 LAB — RESP PANEL BY RT-PCR (FLU A&B, COVID) ARPGX2
Influenza A by PCR: NEGATIVE
Influenza B by PCR: NEGATIVE
SARS Coronavirus 2 by RT PCR: NEGATIVE

## 2022-03-26 LAB — LACTIC ACID, PLASMA
Lactic Acid, Venous: 2 mmol/L (ref 0.5–1.9)
Lactic Acid, Venous: 1.3 mmol/L (ref 0.5–1.9)

## 2022-03-26 LAB — APTT: aPTT: 28 seconds (ref 24–36)

## 2022-03-26 LAB — BRAIN NATRIURETIC PEPTIDE: B Natriuretic Peptide: 243 pg/mL — ABNORMAL HIGH (ref 0.0–100.0)

## 2022-03-26 MED ORDER — IPRATROPIUM-ALBUTEROL 0.5-2.5 (3) MG/3ML IN SOLN
3.0000 mL | Freq: Four times a day (QID) | RESPIRATORY_TRACT | Status: DC
  Administered 2022-03-26 – 2022-03-28 (×8): 3 mL via RESPIRATORY_TRACT
  Filled 2022-03-26 (×8): qty 3

## 2022-03-26 MED ORDER — LINACLOTIDE 145 MCG PO CAPS
145.0000 ug | ORAL_CAPSULE | Freq: Every day | ORAL | Status: DC
  Administered 2022-03-27 – 2022-04-05 (×10): 145 ug via ORAL
  Filled 2022-03-26 (×11): qty 1

## 2022-03-26 MED ORDER — ACETAMINOPHEN 325 MG PO TABS
650.0000 mg | ORAL_TABLET | Freq: Four times a day (QID) | ORAL | Status: DC | PRN
  Administered 2022-03-31 – 2022-04-05 (×6): 650 mg via ORAL
  Filled 2022-03-26 (×6): qty 2

## 2022-03-26 MED ORDER — SULFAMETHOXAZOLE-TRIMETHOPRIM 400-80 MG PO TABS
1.0000 | ORAL_TABLET | ORAL | Status: DC
  Administered 2022-03-27 – 2022-04-05 (×5): 1 via ORAL
  Filled 2022-03-26 (×6): qty 1

## 2022-03-26 MED ORDER — VANCOMYCIN HCL 2000 MG/400ML IV SOLN
2000.0000 mg | Freq: Once | INTRAVENOUS | Status: AC
Start: 2022-03-26 — End: 2022-03-26
  Administered 2022-03-26: 2000 mg via INTRAVENOUS
  Filled 2022-03-26: qty 400

## 2022-03-26 MED ORDER — SODIUM CHLORIDE 0.9 % IV SOLN
2.0000 g | Freq: Once | INTRAVENOUS | Status: AC
  Administered 2022-03-26: 2 g via INTRAVENOUS
  Filled 2022-03-26: qty 12.5

## 2022-03-26 MED ORDER — FOLIC ACID 1 MG PO TABS
2.0000 mg | ORAL_TABLET | Freq: Every day | ORAL | Status: DC
  Administered 2022-03-27 – 2022-04-05 (×10): 2 mg via ORAL
  Filled 2022-03-26 (×10): qty 2

## 2022-03-26 MED ORDER — VANCOMYCIN HCL 1250 MG/250ML IV SOLN: 1250.0000 mg | Freq: Two times a day (BID) | INTRAVENOUS | Status: DC

## 2022-03-26 MED ORDER — SODIUM CHLORIDE 0.9 % IV BOLUS
3000.0000 mL | Freq: Once | INTRAVENOUS | Status: AC
  Administered 2022-03-26 (×3): 1000 mL via INTRAVENOUS

## 2022-03-26 MED ORDER — SODIUM CHLORIDE 0.9 % IV SOLN: INTRAVENOUS | Status: DC

## 2022-03-26 MED ORDER — VANCOMYCIN HCL IN DEXTROSE 1-5 GM/200ML-% IV SOLN
1000.0000 mg | Freq: Once | INTRAVENOUS | Status: DC
  Filled 2022-03-26: qty 200

## 2022-03-26 MED ORDER — METHYLPREDNISOLONE SODIUM SUCC 125 MG IJ SOLR
125.0000 mg | Freq: Once | INTRAMUSCULAR | Status: AC
  Administered 2022-03-26: 125 mg via INTRAVENOUS
  Filled 2022-03-26: qty 2

## 2022-03-26 MED ORDER — ROSUVASTATIN CALCIUM 5 MG PO TABS
10.0000 mg | ORAL_TABLET | Freq: Every day | ORAL | Status: DC
  Administered 2022-03-26 – 2022-04-04 (×10): 10 mg via ORAL
  Filled 2022-03-26: qty 1
  Filled 2022-03-26: qty 2
  Filled 2022-03-26: qty 1
  Filled 2022-03-26 (×3): qty 2
  Filled 2022-03-26: qty 1
  Filled 2022-03-26 (×3): qty 2

## 2022-03-26 MED ORDER — PANTOPRAZOLE SODIUM 40 MG PO TBEC
40.0000 mg | DELAYED_RELEASE_TABLET | Freq: Every day | ORAL | Status: DC
  Administered 2022-03-27 – 2022-04-05 (×10): 40 mg via ORAL
  Filled 2022-03-26 (×10): qty 1

## 2022-03-26 MED ORDER — ONDANSETRON HCL 4 MG/2ML IJ SOLN: 4.0000 mg | Freq: Four times a day (QID) | INTRAMUSCULAR | Status: DC | PRN

## 2022-03-26 MED ORDER — ACETAMINOPHEN 650 MG RE SUPP: 650.0000 mg | Freq: Four times a day (QID) | RECTAL | Status: DC | PRN

## 2022-03-26 MED ORDER — SODIUM CHLORIDE 0.9 % IV SOLN
2.0000 g | Freq: Once | INTRAVENOUS | Status: DC
  Filled 2022-03-26: qty 10

## 2022-03-26 MED ORDER — VANCOMYCIN HCL 1250 MG/250ML IV SOLN
1250.0000 mg | Freq: Two times a day (BID) | INTRAVENOUS | Status: DC
Start: 2022-03-27 — End: 2022-03-27
  Administered 2022-03-26: 1250 mg via INTRAVENOUS
  Filled 2022-03-26: qty 250

## 2022-03-26 MED ORDER — TAPENTADOL HCL 75 MG PO TABS
75.0000 mg | ORAL_TABLET | Freq: Three times a day (TID) | ORAL | Status: DC | PRN
Start: 2022-03-26 — End: 2022-03-26

## 2022-03-26 MED ORDER — SODIUM CHLORIDE 0.9 % IV SOLN: 2.0000 g | Freq: Three times a day (TID) | INTRAVENOUS | Status: DC

## 2022-03-26 MED ORDER — BUDESONIDE 0.5 MG/2ML IN SUSP
0.5000 mg | Freq: Two times a day (BID) | RESPIRATORY_TRACT | Status: DC
  Administered 2022-03-26 – 2022-04-05 (×18): 0.5 mg via RESPIRATORY_TRACT
  Filled 2022-03-26 (×21): qty 2

## 2022-03-26 MED ORDER — TAPENTADOL HCL ER 50 MG PO TB12
50.0000 mg | ORAL_TABLET | Freq: Four times a day (QID) | ORAL | Status: DC | PRN
  Administered 2022-03-26 – 2022-03-28 (×5): 50 mg via ORAL
  Filled 2022-03-26 (×6): qty 1

## 2022-03-26 MED ORDER — TAPENTADOL HCL 50 MG PO TABS: 50.0000 mg | ORAL_TABLET | Freq: Three times a day (TID) | ORAL | Status: DC | PRN

## 2022-03-26 MED ORDER — ONDANSETRON HCL 4 MG PO TABS: 4.0000 mg | ORAL_TABLET | Freq: Four times a day (QID) | ORAL | Status: DC | PRN

## 2022-03-26 MED ORDER — VITAMIN B-12 1000 MCG PO TABS
1000.0000 ug | ORAL_TABLET | Freq: Every day | ORAL | Status: DC
  Administered 2022-03-27 – 2022-04-05 (×10): 1000 ug via ORAL
  Filled 2022-03-26 (×10): qty 1

## 2022-03-26 MED ORDER — INSULIN ASPART 100 UNIT/ML IJ SOLN
0.0000 [IU] | Freq: Three times a day (TID) | INTRAMUSCULAR | Status: DC
  Administered 2022-03-26: 3 [IU] via SUBCUTANEOUS
  Administered 2022-03-27: 1 [IU] via SUBCUTANEOUS
  Administered 2022-03-27: 2 [IU] via SUBCUTANEOUS
  Administered 2022-03-28: 5 [IU] via SUBCUTANEOUS
  Administered 2022-03-29: 3 [IU] via SUBCUTANEOUS
  Administered 2022-03-29: 2 [IU] via SUBCUTANEOUS
  Administered 2022-03-30: 9 [IU] via SUBCUTANEOUS
  Administered 2022-03-31 (×2): 2 [IU] via SUBCUTANEOUS
  Administered 2022-04-01: 5 [IU] via SUBCUTANEOUS
  Administered 2022-04-01: 3 [IU] via SUBCUTANEOUS
  Administered 2022-04-02: 5 [IU] via SUBCUTANEOUS
  Administered 2022-04-02: 2 [IU] via SUBCUTANEOUS
  Administered 2022-04-03: 3 [IU] via SUBCUTANEOUS
  Administered 2022-04-03: 5 [IU] via SUBCUTANEOUS
  Administered 2022-04-04: 9 [IU] via SUBCUTANEOUS
  Administered 2022-04-04 – 2022-04-05 (×2): 2 [IU] via SUBCUTANEOUS
  Administered 2022-04-05: 5 [IU] via SUBCUTANEOUS

## 2022-03-26 MED ORDER — DULOXETINE HCL 60 MG PO CPEP
60.0000 mg | ORAL_CAPSULE | Freq: Every day | ORAL | Status: DC
  Administered 2022-03-27 – 2022-03-28 (×2): 60 mg via ORAL
  Filled 2022-03-26 (×2): qty 1

## 2022-03-26 MED ORDER — SODIUM CHLORIDE 0.9 % IV SOLN
1.0000 g | Freq: Three times a day (TID) | INTRAVENOUS | Status: DC
Start: 2022-03-26 — End: 2022-04-01
  Administered 2022-03-26 – 2022-04-01 (×19): 1 g via INTRAVENOUS
  Filled 2022-03-26 (×23): qty 20

## 2022-03-26 MED ORDER — ENOXAPARIN SODIUM 40 MG/0.4ML IJ SOSY
40.0000 mg | PREFILLED_SYRINGE | INTRAMUSCULAR | Status: DC
  Administered 2022-03-26 – 2022-03-27 (×2): 40 mg via SUBCUTANEOUS
  Filled 2022-03-26 (×2): qty 0.4

## 2022-03-26 MED ORDER — METHYLPREDNISOLONE SODIUM SUCC 40 MG IJ SOLR
40.0000 mg | Freq: Every day | INTRAMUSCULAR | Status: DC
  Administered 2022-03-27 – 2022-03-28 (×2): 40 mg via INTRAVENOUS
  Filled 2022-03-26 (×2): qty 1

## 2022-03-26 MED ORDER — NALOXONE HCL 0.4 MG/ML IJ SOLN
INTRAMUSCULAR | Status: AC
  Filled 2022-03-26: qty 1

## 2022-03-26 MED ORDER — CHLORHEXIDINE GLUCONATE CLOTH 2 % EX PADS
6.0000 | MEDICATED_PAD | Freq: Every day | CUTANEOUS | Status: DC
  Administered 2022-03-27 – 2022-04-05 (×9): 6 via TOPICAL

## 2022-03-26 MED ORDER — NALOXONE HCL 2 MG/2ML IJ SOSY
2.0000 mg | PREFILLED_SYRINGE | INTRAMUSCULAR | Status: DC | PRN
  Administered 2022-03-26 (×2): 2 mg via INTRAVENOUS
  Filled 2022-03-26 (×2): qty 2

## 2022-03-26 NOTE — Assessment & Plan Note (Signed)
Secondary to sepsis in the setting of chronic opioid use Further work-up if no improvement

## 2022-03-26 NOTE — ED Triage Notes (Signed)
Pt to the ED via [facility] from home with altered mental status. When EMS first got to the patient's home she was confused , but able to give her name. She was hot to the touch with a recent history of pneumonia.  Pt arrived to the ED unable to give registration her name. The patient's husband had to be called for information. When the patient was placed in the room she became more unresponsive and was place on a NR with oxygen saturation in the 70's.  Patient was noted to have hypotension and her husband reports a possible UTI.

## 2022-03-26 NOTE — Sepsis Progress Note (Signed)
Elink following code sepsis °

## 2022-03-26 NOTE — Assessment & Plan Note (Signed)
Patient follows with rheumatology at [facility], [place], Dr. Earleen Newport She is on methotrexate every Sunday Rituximab currently on hold secondary to recent sepsis She is chronically on prednisone 5 mg daily On IV stress steroids during hospitalization>>wean to po prednisone --requesting pulmonary consult

## 2022-03-26 NOTE — Assessment & Plan Note (Signed)
Baseline creatinine 0.8-1.1 Monitor with serial BMP

## 2022-03-26 NOTE — ED Provider Notes (Addendum)
[facility] EMERGENCY DEPARTMENT Provider Note   CSN: [id number] Arrival date & time: 03/26/22  [id number]     History  Chief Complaint  Patient presents with   Altered Mental Status    [name] is a 56 y.o. female.  Altered mental status.  Patient presents with 103.5 feet, patient has a history of bronchospasm  The history is provided by the patient. No language interpreter was used.  Altered Mental Status Presenting symptoms: confusion   Severity:  Moderate Most recent episode:  Today Episode history:  Continuous Timing:  Constant Progression:  Worsening Chronicity:  New Context: not alcohol use   Associated symptoms: no abdominal pain        Home Medications Prior to Admission medications   Medication Sig Start Date End Date Taking? Authorizing Provider  acetaminophen (TYLENOL) 325 MG tablet Take 2 tablets (650 mg total) by mouth every 6 (six) hours as needed for mild pain (or Fever >/= 101). 07/12/16  Yes Vann, Jessica U, DO  albuterol (PROVENTIL HFA;VENTOLIN HFA) 108 (90 Base) MCG/ACT inhaler Inhale 2 puffs into the lungs every 6 (six) hours as needed for wheezing or shortness of breath.    [provider]  amitriptyline (ELAVIL) 50 MG tablet Take 50 mg by mouth at bedtime. 11/04/21   [provider]  Calcium Carb-Cholecalciferol (CALCIUM CARBONATE-VITAMIN D3 PO) Take 1 tablet by mouth daily.    [provider]  DULoxetine (CYMBALTA) 60 MG capsule Take 60 mg by mouth daily.    [provider]  folic acid (FOLVITE) 1 MG tablet Take 2 mg by mouth daily.    [provider]  gabapentin (NEURONTIN) 300 MG capsule Take 600-1,200 mg by mouth See admin instructions. Take 3 (900 mg total) capsules by mouth in the morning, 2 (600 mg) capsule in the afternoon and 4 (1200 mg) capsules in the evening.    [provider]  lidocaine (LIDODERM) 5 % Place 1 patch onto the skin daily as needed (for pain). Remove & Discard patch within 12  hours or as directed by MD    [provider]  linaclotide Rolan Lipa) 145 MCG CAPS capsule Take 145 mcg by mouth daily before breakfast. 09/21/21   [provider]  Melatonin 10 MG TABS Take 10 mg by mouth at bedtime as needed (sleep).    [provider]  METHOTREXATE SODIUM IJ Inject 25 mg into the skin every Sunday.    [provider]  naloxone [name]) nasal spray 4 mg/0.1 mL Place 0.4 mg into the nose once. 12/11/19   [provider]  oxyCODONE 10 MG TABS Take 1 tablet (10 mg total) by mouth every 4 (four) hours as needed for severe pain or breakthrough pain. 02/23/22   Dessa Phi, DO  pantoprazole (PROTONIX) 40 MG tablet Take 40 mg by mouth daily. 07/01/21   [provider]  Potassium Chloride Crys ER (K-DUR PO) Take 40 mEq by mouth every other day.    [provider]  predniSONE (DELTASONE) 10 MG tablet Take 4 tabs (40mg ) daily for 2 days, then reduce to your home dose of 5mg  daily 02/23/22   Dessa Phi, DO  propranolol (INDERAL) 40 MG tablet Take 40 mg by mouth daily. 07/21/21   [provider]  rosuvastatin (CRESTOR) 10 MG tablet Take 10 mg by mouth at bedtime. 10/25/21   [provider]  senna (SENOKOT) 8.6 MG TABS tablet Take 2 tablets (17.2 mg total) by mouth 2 (two) times daily. 03/25/21  Corky Sing, PA-C  sulfamethoxazole-trimethoprim (BACTRIM) 400-80 MG tablet Take 1 tablet by mouth 3 (three) times a week. Monday,Wednesday and Friday 07/13/21   [provider]  tapentadol HCl (NUCYNTA) 75 MG tablet Take 75 mg by mouth. Take 1 tablet in the morning, 1 tablet in the afternoon and 2 tablets at bedtime    [provider]  tizanidine (ZANAFLEX) 2 MG capsule Take 2 mg by mouth at bedtime. 05/10/17   [provider]  topiramate (TOPAMAX) 100 MG tablet Take 100 mg by mouth daily. 01/14/18   [provider]  traZODone (DESYREL) 100 MG tablet Take 100 mg by mouth at bedtime.  01/12/20   Martinique, Peter M, MD  vitamin B-12 (CYANOCOBALAMIN) 1000 MCG tablet Take 1,000 mcg by mouth daily.    [provider]  Vitamin D, Ergocalciferol, (DRISDOL) 50000 units CAPS capsule Take 50,000 Units by mouth every Friday.    [provider]      Allergies    Ibuprofen and Penicillins    Review of Systems   Review of Systems  Unable to perform ROS: Mental status change  Gastrointestinal:  Negative for abdominal pain.  Psychiatric/Behavioral:  Positive for confusion.     Physical Exam Updated Vital Signs BP (!) 97/55   Pulse 74   Temp (!) 100.6 F (38.1 C) (Bladder)   Resp (!) 24   Ht 5\' 4"  (1.626 m)   Wt 92.6 kg   SpO2 93%   BMI 35.04 kg/m  Physical Exam Vitals and nursing note reviewed.  Constitutional:      Appearance: She is well-developed.  HENT:     Head: Normocephalic.     Nose: Nose normal.  Eyes:     General: No scleral icterus.    Conjunctiva/sclera: Conjunctivae normal.  Neck:     Thyroid: No thyromegaly.  Cardiovascular:     Rate and Rhythm: Normal rate and regular rhythm.     Heart sounds: No murmur heard.    No friction rub. No gallop.  Pulmonary:     Breath sounds: No stridor. No wheezing or rales.  Chest:     Chest wall: No tenderness.  Abdominal:     General: There is no distension.     Tenderness: There is no abdominal tenderness. There is no rebound.  Musculoskeletal:        General: Normal range of motion.     Cervical back: Neck supple.  Lymphadenopathy:     Cervical: No cervical adenopathy.  Skin:    Findings: No erythema or rash.  Neurological:     Mental Status: She is oriented to person, place, and time.     Motor: No abnormal muscle tone.     Coordination: Coordination normal.  Psychiatric:        Behavior: Behavior normal.     ED Results / Procedures / Treatments   Labs (all labs ordered are listed, but only abnormal results are displayed) Labs Reviewed  LACTIC ACID, PLASMA - Abnormal; Notable  for the following components:      Result Value   Lactic Acid, Venous 2.0 (*)    All other components within normal limits  COMPREHENSIVE METABOLIC PANEL - Abnormal; Notable for the following components:   Chloride 114 (*)    Glucose, Bld 140 (*)    Creatinine, Ser 1.11 (*)    Calcium 7.9 (*)    Total Protein 5.1 (*)    Albumin 2.6 (*)    GFR, Estimated 58 (*)  All other components within normal limits  CBC WITH DIFFERENTIAL/PLATELET - Abnormal; Notable for the following components:   WBC 13.3 (*)    RBC 3.83 (*)    Hemoglobin 9.8 (*)    HCT 33.4 (*)    MCH 25.6 (*)    MCHC 29.3 (*)    RDW 22.2 (*)    nRBC 0.5 (*)    Neutro Abs 9.6 (*)    Abs Immature Granulocytes 0.21 (*)    All other components within normal limits  URINALYSIS, ROUTINE W REFLEX MICROSCOPIC - Abnormal; Notable for the following components:   Nitrite POSITIVE (*)    Leukocytes,Ua MODERATE (*)    WBC, UA >50 (*)    Bacteria, UA MANY (*)    All other components within normal limits  BRAIN NATRIURETIC PEPTIDE - Abnormal; Notable for the following components:   B Natriuretic Peptide 243.0 (*)    All other components within normal limits  BLOOD GAS, VENOUS - Abnormal; Notable for the following components:   pO2, Ven 51 (*)    Acid-base deficit 4.8 (*)    All other components within normal limits  CBG MONITORING, ED - Abnormal; Notable for the following components:   Glucose-Capillary 137 (*)    All other components within normal limits  CULTURE, BLOOD (ROUTINE X 2)  RESP PANEL BY RT-PCR (FLU A&B, COVID) ARPGX2  CULTURE, BLOOD (ROUTINE X 2)  URINE CULTURE  MRSA NEXT GEN BY PCR, NASAL  LACTIC ACID, PLASMA  PROTIME-INR  APTT  POC URINE PREG, ED    EKG None  Radiology CT Head Wo Contrast  Result Date: 03/26/2022 CLINICAL DATA:  Altered mental status.  Recent stroke. EXAM: CT HEAD WITHOUT CONTRAST TECHNIQUE: Contiguous axial images were obtained from the base of the skull through the vertex without  intravenous contrast. RADIATION DOSE REDUCTION: This exam was performed according to the departmental dose-optimization program which includes automated exposure control, adjustment of the mA and/or kV according to patient size and/or use of iterative reconstruction technique. COMPARISON:  11/01/2020 FINDINGS: Brain: No evidence of intracranial hemorrhage, acute infarction, hydrocephalus, extra-axial collection, or mass lesion/mass effect. 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 Vascular:  No hyperdense vessel or other acute findings. Skull: No evidence of fracture or other significant bone abnormality. Sinuses/Orbits:  No acute findings. Other: None. IMPRESSION: No acute intracranial abnormality. Electronically Signed   By: Marlaine Hind M.D.   On: 03/26/2022 11:42   DG Chest Port 1 View  Result Date: 03/26/2022 CLINICAL DATA:  Sepsis. EXAM: PORTABLE CHEST 1 VIEW COMPARISON:  February 19, 2022 FINDINGS: There is infiltrate in the right mid lung. There may be infiltrate in the medial left upper lung. No pneumothorax. No other acute interval changes. The study is limited however due to portable technique. IMPRESSION: The study is limited due to low volume portable technique. However, there is an infiltrate in the right mid lung consistent with for pneumonia given history. There may be a more subtle smaller infiltrate in the medial left upper lung. Electronically Signed   By: Dorise Bullion III M.D.   On: 03/26/2022 10:22    Procedures Procedures    Medications Ordered in ED Medications  naloxone [facility]) injection 2 mg (2 mg Intravenous Given 03/26/22 1211)  0.9 %  sodium chloride infusion ( Intravenous New Bag/Given 03/26/22 1231)  sodium chloride 0.9 % bolus 3,000 mL (0 mLs Intravenous Stopped 03/26/22 1226)  methylPREDNISolone sodium succinate (SOLU-MEDROL) 125  mg/2 mL injection 125 mg (125 mg Intravenous Given 03/26/22 1008)  ceFEPIme  (MAXIPIME) 2 g in sodium chloride 0.9 % 100 mL IVPB (0 g Intravenous Stopped 03/26/22 1145)  vancomycin (VANCOREADY) IVPB 2000 mg/400 mL (2,000 mg Intravenous New Bag/Given 03/26/22 1056)    ED Course/ Medical Decision Making/ A&P  CRITICAL CARE Performed by: Milton Ferguson Total critical care time: 45 minutes Critical care time was exclusive of separately billable procedures and treating other patients. Critical care was necessary to treat or prevent imminent or life-threatening deterioration. Critical care was time spent personally by me on the following activities: development of treatment plan with patient and/or surrogate as well as nursing, discussions with consultants, evaluation of patient's response to treatment, examination of patient, obtaining history from patient or surrogate, ordering and performing treatments and interventions, ordering and review of laboratory studies, ordering and review of radiographic studies, pulse oximetry and re-evaluation of patient's condition.                          Medical Decision Making Amount and/or Complexity of Data Reviewed Labs: ordered. Radiology: ordered. ECG/medicine tests: ordered.  Risk Prescription drug management. Decision regarding hospitalization.    This patient presents to the ED for concern of fever, this involves an extensive number of treatment options, and is a complaint that carries with it a high risk of complications and morbidity.  The differential diagnosis includes sepsis   Co morbidities that complicate the patient evaluation  Bronchospasm   Additional history obtained:  Additional history obtained from family External records from outside source obtained and reviewed including hospital records   Lab Tests:  I Ordered, and personally interpreted labs.  The pertinent results include: White count 13,000   Imaging Studies ordered:  I ordered imaging studies including chest x-ray I independently visualized  and interpreted imaging which showed pneumonia I agree with the radiologist interpretation   Cardiac Monitoring: / EKG:  The patient was maintained on a cardiac monitor.  I personally viewed and interpreted the cardiac monitored which showed an underlying rhythm of: Sinus tach   Consultations Obtained:  I requested consultation with the hospital,  and discussed lab and imaging findings as well as pertinent plan - they recommend: Admit   Problem List / ED Course / Critical interventions / Medication management  Pneumonia I ordered medication including antibiotics for pneumonia Reevaluation of the patient after these medicines showed that the patient stayed the same I have reviewed the patients home medicines and have made adjustments as needed   Social Determinants of Health:  None   Test / Admission - Considered:  None   Patient with sepsis and pneumonia patient will be admitted        Final Clinical Impression(s) / ED Diagnoses Final diagnoses:  Community acquired pneumonia of right upper lobe of lung  Altered mental status, unspecified altered mental status type    Rx / DC Orders ED Discharge Orders     None         Milton Ferguson, MD 03/26/22 1300    Milton Ferguson, MD 05/18/22 1745

## 2022-03-26 NOTE — Plan of Care (Signed)

## 2022-03-26 NOTE — Assessment & Plan Note (Signed)
Patient has intrathecal pump with bupivacaine and ziconotide,  --Last refill 03/02/2022

## 2022-03-26 NOTE — Assessment & Plan Note (Signed)
BMI 35.04 Lifestyle modification

## 2022-03-26 NOTE — Assessment & Plan Note (Addendum)
Suspect a component of aspiration pneumonitis Continue vancomycin continue Merrem MRSA screen neg, but had MRSA in sputum on 02/21/22 --6/6>>give lasix 40 mg IV x 1 --6/6>>stop IVF

## 2022-03-26 NOTE — Assessment & Plan Note (Signed)
Patient has intrathecal pump with bupivacaine and ziconotide,  --Last refill 03/02/2022 PDMP reviewed -- Patient receives Nucynta 75 mg, #120; last refill 03/17/2022 -- Patient is also on oxycodone 10 mg; #30, last refilled 02/23/2022 Judicious opioids given acute metabolic encephalopathy

## 2022-03-26 NOTE — Assessment & Plan Note (Signed)
Secondary to pneumonia and UTI Chest x-ray shows multilobar infiltrates UA shows >50 WBC Continue vancomycin Start Merrem Lactic acid peaked at 2.0 Check PCT Continue IV fluids

## 2022-03-26 NOTE — Assessment & Plan Note (Addendum)
Patient is on gabapentin, Cymbalta, amitriptyline topiramate, trazodone

## 2022-03-26 NOTE — ED Notes (Signed)
Lab drawing blood for second set of cultures

## 2022-03-26 NOTE — H&P (Signed)
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  The patient is unable to provide any history at this time secondary to her encephalopathy.  History is obtained from review of the medical record and speaking with the patient's spouse at the bedside.  According to the patient's spouse, the patient normally has episodes of "on and off confusion" for at least the past year.  He states that the patient was on a day trip with her son to [facility] on 03/25/2022.  Apparently, she had an episode of poor responsiveness after which she was back to her usual baseline.  He states that this has been normal for her for many months.  However, he has noted at the same time that she has been a bit more confused in the past week.  However when she woke up on the morning of 03/26/2022 the patient was more confused and agitated than usual.  The spouse took the patient's temperature with a transcutaneous thermometer, and it was 104.0 F at home.  He states that she has not been on any new medications.  There is been no history of vomiting, diarrhea, worsening shortness of breath.  However she has a chronic cough.  She did have a small amount of hemoptysis on 03/22/2022.  The patient has chronic pain and has an intrathecal pump infusing bupivacaine/ziconitide which was recently refilled on 03/02/2022 at [facility] pain clinic.  He states that the settings have not been changed.  He also states that the patient does not take more opioid  medications than prescribed.  There is been no history of headaches, tonic-clonic activity, or syncope. Notably, the patient was recently hospitalized from 02/19/2022 to 02/23/2022 secondary to myositis of the gluteus medius.  She was discharged home with clindamycin and a steroid taper.  Spouse states that her hip pain has gradually improved since discharge from the hospital, and she is able to ambulate. In the ED, the patient had a temperature up to 103.5 F.  She was initially hypotensive with a blood pressure of 70/46.  Patient was given 3 L normal saline with improvement of her blood pressure of 97/55.  BMP showed sodium 141, potassium 3.6, bicarbonate 22, serum creatinine 1.11.  LFTs were unremarkable.  WBC 13.3, hemoglobin 9.8, platelets 247,000.  Lactic acid 2.0.  VBG showed 7.26/50/51/21 on room air.  CT of the brain was negative for any acute findings.  Chest x-ray showed bilateral infiltrates in the left upper lobe, right upper lobe, right middle lobe.  The patient was started on vancomycin and cefepime.   Review of Systems: As mentioned in the history of present illness. All other systems reviewed and are negative. Past Medical History:  Diagnosis Date   Chronic cough    Chronic pain    Diabetes mellitus without complication (HCC)    Dyspnea    GERD (gastroesophageal reflux disease)    Hypokalemia    Hypothyroidism    Left wrist pain 06/01/2021   Myonecrosis ([place]) 07/01/2021   Neurocardiogenic syncope    OSA (obstructive sleep apnea)  does not use CPAP   Osteomyelitis (HCC)    bilateral feet   Peripheral neuropathy    Presence of intrathecal pump    recieves Prialt/bupivicaine   Tear of gluteus medius tendon 07/01/2021   Wegener's granulomatosis 2009   Wegner's disease (congenital syphilitic osteochondritis)    Past Surgical History:  Procedure Laterality Date   AMPUTATION Right 10/21/2020   Procedure: Right hallux amputation;  Surgeon: Toni Arthurs, MD;  Location: [facility];  Service: Orthopedics;  Laterality: Right;   AMPUTATION TOE Bilateral 08/12/2020   Procedure: Right 3rd toe amputation; left hallux and 3rd toe amputation;  Surgeon: Toni Arthurs, MD;  Location: [facility];  Service: Orthopedics;  Laterality: Bilateral;    AMPUTATION TOE Right 03/24/2021   Procedure: AMPUTATION TOE;  Surgeon: Toni Arthurs, MD;  Location: [facility];  Service: Orthopedics;  Laterality: Right;   APPENDECTOMY     BACK SURGERY     CHOLECYSTECTOMY     COLONOSCOPY N/A 06/02/2013   Procedure: COLONOSCOPY;  Surgeon: Graylin Shiver, MD;  Location: [facility];  Service: Endoscopy;  Laterality: N/A;   HERNIA REPAIR  2000   INCISION AND DRAINAGE ABSCESS Left 07/07/2016   Procedure: DEBRIDMENT LEFT THIGH ABSCESS, EXISION ACUTE SKIN RASH LEFT THIGH(1CM LESION);  Surgeon: Claud Kelp, MD;  Location: [facility];  Service: General;  Laterality: Left;   SEPTOPLASTY  02/2013   spleen anuyism     splenic aneurysm     TRANSMETATARSAL AMPUTATION Bilateral 10/21/2020   Procedure: Left transmetatarsal amputation;  Surgeon: Toni Arthurs, MD;  Location: [facility];  Service: Orthopedics;  Laterality: Bilateral;   TRANSMETATARSAL AMPUTATION Left 03/24/2021   Procedure: TRANSMETATARSAL AMPUTATION;  Surgeon: Toni Arthurs, MD;  Location: [facility] OR;  Service: Orthopedics;  Laterality: Left;   VAGINAL HYSTERECTOMY     Social History:  reports that she has never smoked. She has never used smokeless tobacco. She reports current alcohol use. She reports that she does not use drugs.  Allergies  Allergen Reactions   Ibuprofen Other (See Comments)    Pt is unable to take this due to kidney problems.      Penicillins Rash and Other (See Comments)    Has patient had a PCN reaction causing immediate rash, facial/tongue/throat swelling, SOB or lightheadedness with hypotension: No Has patient had a PCN reaction causing severe rash involving mucus membranes or skin necrosis:  No Has patient had a PCN reaction that required hospitalization No Has patient had a PCN reaction occurring within the last 10 years: No If all of the above answers are "NO", then may proceed with Cephalosporin use.    Family History  Problem Relation Age of Onset   Diabetes Mother    Heart disease Mother    Diabetes Father     Prior to Admission medications   Medication Sig Start Date End Date Taking? Authorizing Provider  acetaminophen (TYLENOL) 325 MG tablet Take 2 tablets (650 mg total) by mouth every 6 (six) hours as needed for mild pain (or Fever >/= 101). 07/12/16  Yes Vann, Jessica U, DO  albuterol (PROVENTIL HFA;VENTOLIN HFA) 108 (90 Base) MCG/ACT inhaler Inhale 2 puffs into the lungs every 6 (six) hours as needed for wheezing or shortness of breath.   Yes [provider]  amitriptyline (ELAVIL) 50 MG tablet Take 50 mg by mouth at bedtime. 11/04/21  Yes [provider]  Calcium Carb-Cholecalciferol (CALCIUM CARBONATE-VITAMIN D3 PO) Take 1 tablet by mouth daily.  Yes [provider]  DULoxetine (CYMBALTA) 60 MG capsule Take 60 mg by mouth daily.   Yes [provider]  folic acid (FOLVITE) 1 MG tablet Take 2 mg by mouth daily.   Yes [provider]  gabapentin (NEURONTIN) 300 MG capsule Take 600-1,200 mg by mouth See admin instructions. Take 3 (900 mg total) capsules by mouth in the morning, 2 (600 mg) capsule in the afternoon and 4 (1200 mg) capsules in the evening.   Yes [provider]  lidocaine (LIDODERM) 5 % Place 1 patch onto the skin daily as needed (for pain). Remove & Discard patch within 12 hours or as directed by MD   Yes [provider]  linaclotide (LINZESS) 145 MCG CAPS capsule Take 145 mcg by mouth daily before breakfast. 09/21/21  Yes [provider]  Melatonin 10 MG TABS Take 10 mg by mouth at bedtime as needed (sleep).   Yes [provider]  metFORMIN (GLUCOPHAGE-XR) 500 MG 24 hr tablet  Take 500 mg by mouth daily with breakfast. 03/13/22  Yes [provider]  methocarbamol (ROBAXIN) 500 MG tablet Take 500 mg by mouth every 6 (six) hours as needed for muscle spasms. 02/02/22  Yes [provider]  METHOTREXATE SODIUM IJ Inject 25 mg into the skin every Sunday.   Yes [provider]  naloxone (NARCAN) nasal spray 4 mg/0.1 mL Place 0.4 mg into the nose once. 12/11/19  Yes [provider]  nystatin (MYCOSTATIN) 100000 UNIT/ML suspension Use as directed 5 mLs in the mouth or throat 4 (four) times daily.   Yes [provider]  pantoprazole (PROTONIX) 40 MG tablet Take 40 mg by mouth daily. 07/01/21  Yes [provider]  Potassium Chloride Crys ER (K-DUR PO) Take 40 mEq by mouth every other day.   Yes [provider]  predniSONE (DELTASONE) 10 MG tablet Take 4 tabs (40mg ) daily for 2 days, then reduce to your home dose of 5mg  daily Patient taking differently: Take 5 mg by mouth daily with breakfast. 02/23/22  Yes Dessa Phi, DO  rosuvastatin (CRESTOR) 10 MG tablet Take 10 mg by mouth at bedtime. 10/25/21  Yes [provider]  sulfamethoxazole-trimethoprim (BACTRIM) 400-80 MG tablet Take 1 tablet by mouth 3 (three) times a week. Monday,Wednesday and Friday 07/13/21  Yes [provider]  tapentadol HCl (NUCYNTA) 75 MG tablet Take 75 mg by mouth. Take 1 tablet in the morning, 1 tablet in the afternoon and 2 tablets at bedtime   Yes [provider]  topiramate (TOPAMAX) 100 MG tablet Take 100 mg by mouth daily. 01/14/18  Yes [provider]  vitamin B-12 (CYANOCOBALAMIN) 1000 MCG tablet Take 1,000 mcg by mouth daily.   Yes [provider]  Vitamin D, Ergocalciferol, (DRISDOL) 50000 units CAPS capsule Take 50,000 Units by mouth every Friday.   Yes [provider]  montelukast (SINGULAIR) 10 MG tablet Take 10 mg by mouth at bedtime. Patient not taking: Reported on 03/26/2022 07/19/20    [provider]  oxyCODONE 10 MG TABS Take 1 tablet (10 mg total) by mouth every 4 (four) hours as needed for severe pain or breakthrough pain. Patient not taking: Reported on 03/26/2022 02/23/22   Dessa Phi, DO  senna (SENOKOT) 8.6 MG TABS tablet Take 2 tablets (17.2 mg total) by mouth 2 (two) times daily. Patient not taking: Reported on 03/26/2022 03/25/21   Corky Sing, PA-C  traZODone (DESYREL) 100 MG tablet Take 100 mg by mouth at  bedtime. Patient not taking: Reported on 03/26/2022 01/12/20   [place], Peter M, MD    Physical Exam: Vitals:   03/26/22 1030 03/26/22 1135 03/26/22 1200 03/26/22 1213  BP: (!) 101/48 (!) 90/49 (!) 97/55   Pulse: 78 76 74   Resp: 17 (!) 25 (!) 24   Temp:    (!) 100.6 F (38.1 C)  TempSrc:    Bladder  SpO2: 96% 93% 93%   Weight:      Height:       GENERAL:  A&O x 1, NAD, well developed, cooperative, follows commands HEENT: [place]/AT, No thrush, No icterus, No oral ulcers Neck:  No neck mass, No meningismus, soft, supple CV: RRR, no S3, no S4, no rub, no JVD Lungs:  bilateral rales.  Bibasilar wheeze Abd: soft/NT +BS, nondistended Ext: No edema, no lymphangitis, no cyanosis, no rashes Neuro:  CN II-XII intact, strength 4/5 in RUE, RLE, strength 4/5 LUE, LLE; sensation intact bilateral; no dysmetria; babinski equivocal  Data Reviewed: Data reviewed in history  Assessment and Plan: * Severe sepsis ([place]) Secondary to pneumonia and UTI Chest x-ray shows multilobar infiltrates UA shows >50 WBC Continue vancomycin Start Merrem Lactic acid peaked at 2.0 Check PCT Continue IV fluids  Presence of intrathecal pump Patient has intrathecal pump with bupivacaine and ziconotide,  --Last refill 03/02/2022  Stage 3a chronic kidney disease (HCC) Baseline creatinine 0.8-1.1 Monitor with serial BMP  Secondary adrenal insufficiency (HCC) She is chronically on prednisone 5 mg daily Methylprednisolone 125 mg IV given in the ED  Chemotherapy-induced  peripheral neuropathy ([place]) Patient is on gabapentin, Cymbalta, amitriptyline topiramate, trazodone  UTI (urinary tract infection) UA > 50 WBC -Continue empiric vancomycin and Merrem pending culture data  Lobar pneumonia ([place]) Suspect a component of aspiration pneumonitis Continue vancomycin Start Merrem MRSA screen Urine Legionella antigen  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 Allean Found She is on methotrexate every Sunday Rituximab currently on hold secondary to recent sepsis She is chronically on prednisone 5 mg daily  Class 2 obesity BMI 35.04 Lifestyle modification  Acute metabolic encephalopathy Secondary to sepsis in the setting of chronic opioid use Further work-up if no improvement  Opioid dependence ([place]) Patient has intrathecal pump with bupivacaine and ziconotide,  --Last refill 03/02/2022 PDMP reviewed -- Patient receives Nucynta 75 mg, #120; last refill 03/17/2022 -- Patient is also on oxycodone 10 mg; #30, last refilled 02/23/2022 Judicious opioids given acute metabolic encephalopathy      Advance Care Planning: FULL  Consults: none  Family Communication: spouse at bedside  Severity of Illness: The appropriate patient status for this patient is INPATIENT. Inpatient status is judged to be reasonable and necessary in order to provide the required intensity of service to ensure the patient's safety. The patient's presenting symptoms, physical exam findings, and initial radiographic and laboratory data in the context of their chronic comorbidities is felt to place them at high risk for further clinical deterioration. Furthermore, it is not anticipated that the patient will be medically stable for discharge from the hospital within 2 midnights of admission.   * I certify that at the point of admission it is my clinical judgment that the patient will require inpatient hospital care spanning beyond 2 midnights from  the point of admission due to high intensity of service, high risk for further deterioration and high frequency of surveillance required.*  Author: Orson Eva, MD 03/26/2022 1:06 PM  For on call review [email or web address].

## 2022-03-26 NOTE — ED Notes (Signed)
Pt was given 1000 mg of tylenol in route.

## 2022-03-26 NOTE — ED Notes (Signed)
Patient transported to CT 

## 2022-03-26 NOTE — Assessment & Plan Note (Signed)
She is chronically on prednisone 5 mg daily Methylprednisolone 125 mg IV given in the ED, then 40 mg IV daily --wean to prednisone

## 2022-03-26 NOTE — Progress Notes (Addendum)
Pharmacy Antibiotic Note  [name] a 56 y.o. female admitted on 03/26/2022 with pneumonia.  Pharmacy has been consulted for vancomycin and cefepime dosing.  Plan: Vancomycin 1250mg  IV every 12 hours.  Goal trough 15-20 mcg/mL. Cefepime 2gm IV every 8 hours.  Medical History: Past Medical History:  Diagnosis Date   Chronic cough    Chronic pain    Diabetes mellitus without complication (HCC)    Dyspnea    GERD (gastroesophageal reflux disease)    Hypokalemia    Hypothyroidism    Left wrist pain 06/01/2021   Myonecrosis (HCC) 07/01/2021   Neurocardiogenic syncope    OSA (obstructive sleep apnea)    does not use CPAP   Osteomyelitis (HCC)    bilateral feet   Peripheral neuropathy    Presence of intrathecal pump    recieves Prialt/bupivicaine   Tear of gluteus medius tendon 07/01/2021   Wegener's granulomatosis 2009   Wegner's disease (congenital syphilitic osteochondritis)     Allergies:  Allergies  Allergen Reactions   Ibuprofen Other (See Comments)    Pt is unable to take this due to kidney problems.      Penicillins Rash and Other (See Comments)    Has patient had a PCN reaction causing immediate rash, facial/tongue/throat swelling, SOB or lightheadedness with hypotension: No Has patient had a PCN reaction causing severe rash involving mucus membranes or skin necrosis: No Has patient had a PCN reaction that required hospitalization No Has patient had a PCN reaction occurring within the last 10 years: No If all of the above answers are "NO", then may proceed with Cephalosporin use.    Filed Weights   03/26/22 1000  Weight: 92.6 kg (204 lb 2.3 oz)       Latest Ref Rng & Units 03/26/2022   10:07 AM 02/22/2022    5:55 AM 02/20/2022    5:05 AM  CBC  WBC 4.0 - 10.5 K/uL 13.3   11.5   15.3    Hemoglobin 12.0 - 15.0 g/dL 9.8   8.2   8.6    Hematocrit 36.0 - 46.0 % 33.4   29.1   30.0    Platelets 150 - 400 K/uL 247   304   296       Estimated Creatinine Clearance: 62.4  mL/min (A) (by C-G formula based on SCr of 1.11 mg/dL (H)).  Antibiotics Given (last 72 hours)     Date/Time Action Medication Dose Rate   03/26/22 1050 New Bag/Given   ceFEPIme (MAXIPIME) 2 g in sodium chloride 0.9 % 100 mL IVPB 2 g 200 mL/hr   03/26/22 1056 New Bag/Given   vancomycin (VANCOREADY) IVPB 2000 mg/400 mL 2,000 mg 200 mL/hr       Antimicrobials this admission:  Cefepime 03/26/2022  >>  vancomycin 03/26/2022  >>   Microbiology results: 03/26/2022  BCx: sent 03/26/2022  UCx: sent 03/26/2022  Resp Panel: sent  03/26/2022  MRSA PCR: sent  Thank you for allowing pharmacy to be a part of this patient's care.  05/26/2022, PharmD Clinical Pharmacist

## 2022-03-26 NOTE — Assessment & Plan Note (Signed)
UA > 50 WBC -culture = Ecoli -continue merrem

## 2022-03-26 NOTE — Hospital Course (Signed)
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  The patient is unable to provide any history at this time secondary to her encephalopathy.  History is obtained from review of the medical record and speaking with the patient's spouse at the bedside.  According to the patient's spouse, the patient normally has episodes of "on and off confusion" for at least the past year.  He states that the patient was on a day trip with her son to [facility] on 03/25/2022.  Apparently, she had an episode of poor responsiveness after which she was back to her usual baseline.  He states that this has been normal for her for many months.  However, he has noted at the same time that she has been a bit more confused in the past week.  However when she woke up on the morning of 03/26/2022 the patient was more confused and agitated than usual.  The spouse took the patient's temperature with a transcutaneous thermometer, and it was 104.0 F at home.  He states that she has not been on any new medications.  There is been no history of vomiting, diarrhea, worsening shortness of breath.  However she has a chronic cough.  She did have a small amount of hemoptysis on 03/22/2022.  The patient has chronic pain and has an intrathecal pump infusing bupivacaine/ziconitide which was recently refilled on 03/02/2022 at [facility] pain clinic.  He states that the settings have not been changed.  He also states that the patient does not take more opioid medications than prescribed.  There is been no history of headaches, tonic-clonic activity, or syncope. Notably, the patient was recently hospitalized from 02/19/2022 to 02/23/2022 secondary to myositis of the gluteus medius.  She was discharged home with clindamycin and a steroid taper.  Spouse states that her hip  pain has gradually improved since discharge from the hospital, and she is able to ambulate. In the ED, the patient had a temperature up to 103.5 F.  She was initially hypotensive with a blood pressure of 70/46.  Patient was given 3 L normal saline with improvement of her blood pressure of 97/55.  BMP showed sodium 141, potassium 3.6, bicarbonate 22, serum creatinine 1.11.  LFTs were unremarkable.  WBC 13.3, hemoglobin 9.8, platelets 247,000.  Lactic acid 2.0.  VBG showed 7.26/50/51/21 on room air.  CT of the brain was negative for any acute findings.  Chest x-ray showed bilateral infiltrates in the left upper lobe, right upper lobe, right middle lobe.  The patient was started on vancomycin and cefepime.

## 2022-03-27 ENCOUNTER — Inpatient Hospital Stay (HOSPITAL_COMMUNITY): Payer: BC Managed Care – PPO

## 2022-03-27 ENCOUNTER — Encounter (HOSPITAL_COMMUNITY): Payer: Self-pay | Admitting: Radiology

## 2022-03-27 DIAGNOSIS — G9341 Metabolic encephalopathy: Secondary | ICD-10-CM | POA: Diagnosis not present

## 2022-03-27 DIAGNOSIS — E669 Obesity, unspecified: Secondary | ICD-10-CM | POA: Diagnosis not present

## 2022-03-27 DIAGNOSIS — M313 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: Secondary | ICD-10-CM | POA: Diagnosis not present

## 2022-03-27 DIAGNOSIS — A419 Sepsis, unspecified organism: Secondary | ICD-10-CM | POA: Diagnosis not present

## 2022-03-27 LAB — COMPREHENSIVE METABOLIC PANEL
ALT: 17 U/L (ref 0–44)
AST: 23 U/L (ref 15–41)
Albumin: 2.4 g/dL — ABNORMAL LOW (ref 3.5–5.0)
Alkaline Phosphatase: 67 U/L (ref 38–126)
Anion gap: 1 — ABNORMAL LOW (ref 5–15)
BUN: 14 mg/dL (ref 6–20)
CO2: 20 mmol/L — ABNORMAL LOW (ref 22–32)
Calcium: 7.2 mg/dL — ABNORMAL LOW (ref 8.9–10.3)
Chloride: 122 mmol/L — ABNORMAL HIGH (ref 98–111)
Creatinine, Ser: 0.86 mg/dL (ref 0.44–1.00)
GFR, Estimated: >60 mL/min (ref >60)
Glucose, Bld: 118 mg/dL — ABNORMAL HIGH (ref 70–99)
Potassium: 3.8 mmol/L (ref 3.5–5.1)
Sodium: 143 mmol/L (ref 135–145)
Total Bilirubin: 0.6 mg/dL (ref 0.3–1.2)
Total Protein: 4.7 g/dL — ABNORMAL LOW (ref 6.5–8.1)

## 2022-03-27 LAB — CBC
HCT: 29.4 % — ABNORMAL LOW (ref 36.0–46.0)
Hemoglobin: 8.5 g/dL — ABNORMAL LOW (ref 12.0–15.0)
MCH: 25.6 pg — ABNORMAL LOW (ref 26.0–34.0)
MCHC: 28.9 g/dL — ABNORMAL LOW (ref 30.0–36.0)
MCV: 88.6 fL (ref 80.0–100.0)
Platelets: 210 10*3/uL (ref 150–400)
RBC: 3.32 MIL/uL — ABNORMAL LOW (ref 3.87–5.11)
RDW: 22.3 % — ABNORMAL HIGH (ref 11.5–15.5)
WBC: 17 10*3/uL — ABNORMAL HIGH (ref 4.0–10.5)
nRBC: 0 % (ref 0.0–0.2)

## 2022-03-27 LAB — GLUCOSE, CAPILLARY
Glucose-Capillary: 84 mg/dL (ref 70–99)
Glucose-Capillary: 135 mg/dL — ABNORMAL HIGH (ref 70–99)
Glucose-Capillary: 184 mg/dL — ABNORMAL HIGH (ref 70–99)
Glucose-Capillary: 197 mg/dL — ABNORMAL HIGH (ref 70–99)

## 2022-03-27 LAB — HEPARIN LEVEL (UNFRACTIONATED): Heparin Unfractionated: >1.1 IU/mL — ABNORMAL HIGH (ref 0.30–0.70)

## 2022-03-27 MED ORDER — HEPARIN (PORCINE) 25000 UT/250ML-% IV SOLN
1300.0000 [IU]/h | INTRAVENOUS | Status: DC
Start: 2022-03-27 — End: 2022-03-27
  Administered 2022-03-27: 1300 [IU]/h via INTRAVENOUS
  Filled 2022-03-27: qty 250

## 2022-03-27 MED ORDER — GABAPENTIN 300 MG PO CAPS
600.0000 mg | ORAL_CAPSULE | Freq: Every day | ORAL | Status: DC
  Administered 2022-03-27 – 2022-03-28 (×2): 600 mg via ORAL
  Filled 2022-03-27 (×2): qty 2

## 2022-03-27 MED ORDER — GABAPENTIN 300 MG PO CAPS
900.0000 mg | ORAL_CAPSULE | Freq: Every morning | ORAL | Status: DC
  Administered 2022-03-28 – 2022-03-29 (×2): 900 mg via ORAL
  Filled 2022-03-27 (×2): qty 3

## 2022-03-27 MED ORDER — VANCOMYCIN HCL 1750 MG/350ML IV SOLN
1750.0000 mg | INTRAVENOUS | Status: DC
  Administered 2022-03-27 – 2022-03-28 (×2): 1750 mg via INTRAVENOUS
  Filled 2022-03-27 (×2): qty 350

## 2022-03-27 MED ORDER — IOHEXOL 300 MG/ML  SOLN
100.0000 mL | Freq: Once | INTRAMUSCULAR | Status: AC | PRN
  Administered 2022-03-27: 75 mL via INTRAVENOUS

## 2022-03-27 MED ORDER — GABAPENTIN 400 MG PO CAPS
1200.0000 mg | ORAL_CAPSULE | Freq: Every day | ORAL | Status: DC
  Administered 2022-03-27 – 2022-03-28 (×2): 1200 mg via ORAL
  Filled 2022-03-27 (×2): qty 3

## 2022-03-27 MED ORDER — AMITRIPTYLINE HCL 50 MG PO TABS
50.0000 mg | ORAL_TABLET | Freq: Every day | ORAL | Status: DC
  Administered 2022-03-27 – 2022-04-04 (×9): 50 mg via ORAL
  Filled 2022-03-27 (×3): qty 1
  Filled 2022-03-27 (×2): qty 2
  Filled 2022-03-27 (×4): qty 1

## 2022-03-27 MED ORDER — HEPARIN BOLUS VIA INFUSION
4000.0000 [IU] | Freq: Once | INTRAVENOUS | Status: AC
  Administered 2022-03-27: 4000 [IU] via INTRAVENOUS
  Filled 2022-03-27: qty 4000

## 2022-03-27 MED ORDER — HEPARIN (PORCINE) 25000 UT/250ML-% IV SOLN
1100.0000 [IU]/h | INTRAVENOUS | Status: AC
  Administered 2022-03-28: 1100 [IU]/h via INTRAVENOUS
  Administered 2022-03-28 – 2022-03-29 (×2): 900 [IU]/h via INTRAVENOUS
  Filled 2022-03-27 (×2): qty 250

## 2022-03-27 NOTE — TOC Initial Note (Signed)
Transition of Care [facility]) - Initial/Assessment Note    Patient Details  Name: [name] MRN: [id number] Date of Birth: [birth year]  Transition of Care [facility] CM/SW Contact:    Salome Arnt, [place] Phone Number: 03/27/2022, 10:19 AM  Clinical Narrative:  Pt admitted with severe sepsis. Assessment completed due to high risk readmission score. Pt reports she lives with her husband and is fairly independent with ADLs. She primarily uses a cane to ambulate, but also has a scooter if needed. Pt plans to return home when medically stable. No needs reported at this time. TOC received consult for home health/DME. Per MD, no needs. Will re-consult if needed.                  Expected Discharge Plan: Home/Self Care Barriers to Discharge: Continued Medical Work up   Patient Goals and CMS Choice Patient states their goals for this hospitalization and ongoing recovery are:: return home   Choice offered to / list presented to : Patient  Expected Discharge Plan and Services Expected Discharge Plan: Home/Self Care In-house Referral: Clinical Social Work     Living arrangements for the past 2 months: Single Family Home                                      Prior Living Arrangements/Services Living arrangements for the past 2 months: Single Family Home Lives with:: Spouse Patient language and need for interpreter reviewed:: Yes Do you feel safe going back to the place where you live?: Yes      Need for Family Participation in Patient Care: No (Comment)   Current home services: DME (Cane, walker, scooter) Criminal Activity/Legal Involvement Pertinent to Current Situation/Hospitalization: No - Comment as needed  Activities of Daily Living Home Assistive Devices/Equipment: Cane (specify quad or straight), Eyeglasses, Environmental consultant (specify type), Wheelchair, Other (Comment) (Intrathecal device- [employer]) ADL Screening (condition at time of admission) Patient's cognitive ability  adequate to safely complete daily activities?: Yes Is the patient deaf or have difficulty hearing?: No Does the patient have difficulty seeing, even when wearing glasses/contacts?: No Does the patient have difficulty concentrating, remembering, or making decisions?: No Patient able to express need for assistance with ADLs?: Yes Does the patient have difficulty dressing or bathing?: No Independently performs ADLs?: Yes (appropriate for developmental age) Does the patient have difficulty walking or climbing stairs?: Yes Weakness of Legs: Both Weakness of Arms/Hands: Both  Permission Sought/Granted                  Emotional Assessment     Affect (typically observed): Appropriate Orientation: : Oriented to Self, Oriented to Place, Oriented to  Time, Oriented to Situation Alcohol / Substance Use: Not Applicable Psych Involvement: No (comment)  Admission diagnosis:  Severe sepsis ([place]) [A41.9, R65.20] Altered mental status, unspecified altered mental status type [R41.82] Community acquired pneumonia of right upper lobe of lung [J18.9] Patient Active Problem List   Diagnosis Date Noted   Severe sepsis ([place]) 03/26/2022   UTI (urinary tract infection) 03/26/2022   Opioid dependence ([place]) XX123456   Acute metabolic encephalopathy XX123456   Hip pain 02/20/2022   Arthritis of right hip 02/19/2022   Hypothyroidism    AKI (acute kidney injury) ([place])    Class 2 obesity    Depression    Essential hypertension    Acute cystitis without hematuria 01/04/2022   Dysfunction of left rotator cuff 01/04/2022  Lobar pneumonia ([place]) 01/04/2022   Acquired absence of other toe(s), unspecified side ([place]) 08/01/2021   Myositis of right lower extremity 07/01/2021   Tear of gluteus medius tendon 07/01/2021   Left wrist pain 06/01/2021   Medication monitoring encounter 05/10/2021   Chronic pain 03/24/2021   Stage 3a chronic kidney disease ([place]) 09/08/2019   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   Steroid-induced hyperglycemia 05/11/2018   Morbid obesity ([place]) 01/25/2018   Neuropathy 01/25/2018   Presence of intrathecal pump 08/02/2017   Rectocele 03/09/2017   Abnormal finding on imaging 12/19/2016   Esophageal dysphagia 12/19/2016   Current chronic use of systemic steroids 10/26/2016   IFG (impaired fasting glucose) 10/26/2016   Arthralgia of multiple joints 07/15/2016   Headache 07/15/2016   Peripheral neuropathy 07/04/2016   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   Neuropathic pain of both feet 01/11/2016   OSA (obstructive sleep apnea) 12/29/2015   Chronic insomnia 12/29/2015   HLD (hyperlipidemia) 12/29/2015   Obesity, Class II, BMI 35-39.9, with comorbidity 12/29/2015   Polyneuropathy associated with underlying disease ([place]) 12/29/2015   Class 2 drug-induced obesity with serious comorbidity and body mass index (BMI) of 35.0 to 35.9 in adult 12/29/2015   Other mechanical complication of implanted electronic neurostimulator of spinal cord electrode (lead), initial encounter ([place]) 08/04/2015   Adjustment disorder with depressed mood 03/29/2015   Mononeuritis 07/20/2014   Obesity (BMI 30-39.9) 07/20/2014   Neurocardiogenic syncope 07/06/2014   Normocytic anemia 06/08/2014   POTS (postural orthostatic tachycardia syndrome) 06/08/2014   Encounter for long-term (current) use of high-risk medication 05/30/2013   Fall 05/30/2013   Other long term (current) drug therapy 05/30/2013   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[place]) 05/30/2013   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   Congenital syphilitic osteochondritis    GERD (gastroesophageal reflux disease) 03/07/2012   Other diseases of trachea and bronchus 08/30/2011   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   Atrophy of nasal turbinates 07/25/2011   Nasal septal perforation 07/25/2011   Cough 03/24/2011   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[place]) 03/24/2011   Subglottic stenosis 03/24/2011   Granulomatosis with polyangiitis ([place]) 2009   PCP:  Chesley Noon, MD Pharmacy:   [place], [place] [id number] [place] [id number] Phone: [phone] Fax: [phone]     Social Determinants of Health (SDOH) Interventions    Readmission Risk Interventions    03/27/2022   10:12 AM 02/20/2022    1:49 PM  Readmission Risk Prevention Plan  Transportation Screening Complete Complete  HRI or [place]  Complete  Social Work Consult for [place] Planning/Counseling  Complete  Palliative Care Screening  Not Applicable  Medication Review Press photographer) Complete Complete  HRI or [place] Complete   SW Recovery Care/Counseling Consult Complete   Palliative Care Screening Not [place] Not Applicable

## 2022-03-27 NOTE — Progress Notes (Signed)
ANTICOAGULATION CONSULT NOTE - Initial Consult  Pharmacy Consult for heparin Indication: pulmonary embolus  Allergies  Allergen Reactions   Ibuprofen Other (See Comments)    Pt is unable to take this due to kidney problems.      Penicillins Rash and Other (See Comments)    Has patient had a PCN reaction causing immediate rash, facial/tongue/throat swelling, SOB or lightheadedness with hypotension: No Has patient had a PCN reaction causing severe rash involving mucus membranes or skin necrosis: No Has patient had a PCN reaction that required hospitalization No Has patient had a PCN reaction occurring within the last 10 years: No If all of the above answers are "NO", then may proceed with Cephalosporin use.    Patient Measurements: Height: 5\' 4"  (162.6 cm) Weight: 95 kg (209 lb 7 oz) IBW/kg (Calculated) : 54.7 Heparin Dosing Weight: 76 kg  Vital Signs: Temp: 98.3 F (36.8 C) (06/05 1124) Temp Source: Oral (06/05 1124) BP: 130/73 (06/05 1428) Pulse Rate: 94 (06/05 1428)  Labs: Recent Labs    03/26/22 1007 03/27/22 0535  HGB 9.8* 8.5*  HCT 33.4* 29.4*  PLT 247 210  APTT 28  --   LABPROT 14.1  --   INR 1.1  --   CREATININE 1.11* 0.86    Estimated Creatinine Clearance: 81.6 mL/min (by C-G formula based on SCr of 0.86 mg/dL).   Medical History: Past Medical History:  Diagnosis Date   Chronic cough    Chronic pain    Diabetes mellitus without complication (HCC)    Dyspnea    GERD (gastroesophageal reflux disease)    Hypokalemia    Hypothyroidism    Left wrist pain 06/01/2021   Myonecrosis (HCC) 07/01/2021   Neurocardiogenic syncope    OSA (obstructive sleep apnea)    does not use CPAP   Osteomyelitis (HCC)    bilateral feet   Peripheral neuropathy    Presence of intrathecal pump    recieves Prialt/bupivicaine   Tear of gluteus medius tendon 07/01/2021   Wegener's granulomatosis 2009   Wegner's disease (congenital syphilitic osteochondritis)     Medications:   Medications Prior to Admission  Medication Sig Dispense Refill Last Dose   acetaminophen (TYLENOL) 325 MG tablet Take 2 tablets (650 mg total) by mouth every 6 (six) hours as needed for mild pain (or Fever >/= 101).   unknown   albuterol (PROVENTIL HFA;VENTOLIN HFA) 108 (90 Base) MCG/ACT inhaler Inhale 2 puffs into the lungs every 6 (six) hours as needed for wheezing or shortness of breath.   unknown   amitriptyline (ELAVIL) 50 MG tablet Take 50 mg by mouth at bedtime.   03/25/2022   Calcium Carb-Cholecalciferol (CALCIUM CARBONATE-VITAMIN D3 PO) Take 1 tablet by mouth daily.   unknown   DULoxetine (CYMBALTA) 60 MG capsule Take 60 mg by mouth daily.   03/26/2022   folic acid (FOLVITE) 1 MG tablet Take 2 mg by mouth daily.   unknown   gabapentin (NEURONTIN) 300 MG capsule Take 600-1,200 mg by mouth See admin instructions. Take 3 (900 mg total) capsules by mouth in the morning, 2 (600 mg) capsule in the afternoon and 4 (1200 mg) capsules in the evening.   03/26/2022   lidocaine (LIDODERM) 5 % Place 1 patch onto the skin daily as needed (for pain). Remove & Discard patch within 12 hours or as directed by MD   03/25/2022   linaclotide (LINZESS) 145 MCG CAPS capsule Take 145 mcg by mouth daily before breakfast.   03/26/2022  Melatonin 10 MG TABS Take 10 mg by mouth at bedtime as needed (sleep).   03/25/2022   metFORMIN (GLUCOPHAGE-XR) 500 MG 24 hr tablet Take 500 mg by mouth daily with breakfast.   03/26/2022   methocarbamol (ROBAXIN) 500 MG tablet Take 500 mg by mouth every 6 (six) hours as needed for muscle spasms.   03/25/2022   METHOTREXATE SODIUM IJ Inject 25 mg into the skin every Sunday.   03/19/2022   naloxone (NARCAN) nasal spray 4 mg/0.1 mL Place 0.4 mg into the nose once.   unknown   nystatin (MYCOSTATIN) 100000 UNIT/ML suspension Use as directed 5 mLs in the mouth or throat 4 (four) times daily.   Past Week   pantoprazole (PROTONIX) 40 MG tablet Take 40 mg by mouth daily.   03/26/2022   Potassium Chloride  Crys ER (K-DUR PO) Take 40 mEq by mouth every other day.   unknown   predniSONE (DELTASONE) 10 MG tablet Take 4 tabs (40mg ) daily for 2 days, then reduce to your home dose of 5mg  daily (Patient taking differently: Take 5 mg by mouth daily with breakfast.) 30 tablet 0 03/26/2022   rosuvastatin (CRESTOR) 10 MG tablet Take 10 mg by mouth at bedtime.   03/25/2022   sulfamethoxazole-trimethoprim (BACTRIM) 400-80 MG tablet Take 1 tablet by mouth 3 (three) times a week. Monday,Wednesday and Friday   03/24/2022   tapentadol HCl (NUCYNTA) 75 MG tablet Take 75 mg by mouth. Take 1 tablet in the morning, 1 tablet in the afternoon and 2 tablets at bedtime   03/26/2022   topiramate (TOPAMAX) 100 MG tablet Take 100 mg by mouth daily.   03/26/2022   vitamin B-12 (CYANOCOBALAMIN) 1000 MCG tablet Take 1,000 mcg by mouth daily.   unknown   Vitamin D, Ergocalciferol, (DRISDOL) 50000 units CAPS capsule Take 50,000 Units by mouth every Friday.   03/24/2022   oxyCODONE 10 MG TABS Take 1 tablet (10 mg total) by mouth every 4 (four) hours as needed for severe pain or breakthrough pain. (Patient not taking: Reported on 03/26/2022) 30 tablet 0 Not Taking   senna (SENOKOT) 8.6 MG TABS tablet Take 2 tablets (17.2 mg total) by mouth 2 (two) times daily. (Patient not taking: Reported on 03/26/2022) 30 tablet 0 Not Taking    Assessment: Pharmacy consulted to dose heparin in patient with pulmonary embolism, confirmed by CT.  Patient is not on anticoagulation prior to admission but received prophylaxis lovenox 6/5 1419.  Goal of Therapy:  Heparin level 0.3-0.7 units/ml Monitor platelets by anticoagulation protocol: Yes   Plan:  Give 4000 units bolus x 1 Start heparin infusion at 1300 units/hr Check anti-Xa level in 6 hours and daily while on heparin Continue to monitor H&H and platelets  Margot Ables, PharmD Clinical Pharmacist 03/27/2022 3:18 PM

## 2022-03-27 NOTE — Progress Notes (Signed)
Spoke with radiology, Dr. Tyron Russell CTA chest--two small right lung blood clots --start IV heparin --Echo ordered --spouse and patient updated  DTat

## 2022-03-27 NOTE — Progress Notes (Signed)
ANTICOAGULATION CONSULT NOTE   Pharmacy Consult for heparin Indication: pulmonary embolus  Allergies  Allergen Reactions   Ibuprofen Other (See Comments)    Pt is unable to take this due to kidney problems.      Penicillins Rash and Other (See Comments)    Has patient had a PCN reaction causing immediate rash, facial/tongue/throat swelling, SOB or lightheadedness with hypotension: No Has patient had a PCN reaction causing severe rash involving mucus membranes or skin necrosis: No Has patient had a PCN reaction that required hospitalization No Has patient had a PCN reaction occurring within the last 10 years: No If all of the above answers are "NO", then may proceed with Cephalosporin use.    Patient Measurements: Height: 5\' 4"  (162.6 cm) Weight: 95 kg (209 lb 7 oz) IBW/kg (Calculated) : 54.7 Heparin Dosing Weight: 76 kg  Vital Signs: Temp: 98.1 F (36.7 C) (06/05 2106) Temp Source: Oral (06/05 2106) BP: 164/90 (06/05 1729) Pulse Rate: 83 (06/05 1900)  Labs: Recent Labs    03/26/22 1007 03/27/22 0535 03/27/22 2222  HGB 9.8* 8.5*  --   HCT 33.4* 29.4*  --   PLT 247 210  --   APTT 28  --   --   LABPROT 14.1  --   --   INR 1.1  --   --   HEPARINUNFRC  --   --  >1.10*  CREATININE 1.11* 0.86  --      Estimated Creatinine Clearance: 81.6 mL/min (by C-G formula based on SCr of 0.86 mg/dL).   Medical History: Past Medical History:  Diagnosis Date   Chronic cough    Chronic pain    Diabetes mellitus without complication (HCC)    Dyspnea    GERD (gastroesophageal reflux disease)    Hypokalemia    Hypothyroidism    Left wrist pain 06/01/2021   Myonecrosis (HCC) 07/01/2021   Neurocardiogenic syncope    OSA (obstructive sleep apnea)    does not use CPAP   Osteomyelitis (HCC)    bilateral feet   Peripheral neuropathy    Presence of intrathecal pump    recieves Prialt/bupivicaine   Tear of gluteus medius tendon 07/01/2021   Wegener's granulomatosis 2009   Wegner's  disease (congenital syphilitic osteochondritis)     Medications:  Medications Prior to Admission  Medication Sig Dispense Refill Last Dose   acetaminophen (TYLENOL) 325 MG tablet Take 2 tablets (650 mg total) by mouth every 6 (six) hours as needed for mild pain (or Fever >/= 101).   unknown   albuterol (PROVENTIL HFA;VENTOLIN HFA) 108 (90 Base) MCG/ACT inhaler Inhale 2 puffs into the lungs every 6 (six) hours as needed for wheezing or shortness of breath.   unknown   amitriptyline (ELAVIL) 50 MG tablet Take 50 mg by mouth at bedtime.   03/25/2022   Calcium Carb-Cholecalciferol (CALCIUM CARBONATE-VITAMIN D3 PO) Take 1 tablet by mouth daily.   unknown   DULoxetine (CYMBALTA) 60 MG capsule Take 60 mg by mouth daily.   03/26/2022   folic acid (FOLVITE) 1 MG tablet Take 2 mg by mouth daily.   unknown   gabapentin (NEURONTIN) 300 MG capsule Take 600-1,200 mg by mouth See admin instructions. Take 3 (900 mg total) capsules by mouth in the morning, 2 (600 mg) capsule in the afternoon and 4 (1200 mg) capsules in the evening.   03/26/2022   lidocaine (LIDODERM) 5 % Place 1 patch onto the skin daily as needed (for pain). Remove & Discard patch within  12 hours or as directed by MD   03/25/2022   linaclotide (LINZESS) 145 MCG CAPS capsule Take 145 mcg by mouth daily before breakfast.   03/26/2022   Melatonin 10 MG TABS Take 10 mg by mouth at bedtime as needed (sleep).   03/25/2022   metFORMIN (GLUCOPHAGE-XR) 500 MG 24 hr tablet Take 500 mg by mouth daily with breakfast.   03/26/2022   methocarbamol (ROBAXIN) 500 MG tablet Take 500 mg by mouth every 6 (six) hours as needed for muscle spasms.   03/25/2022   METHOTREXATE SODIUM IJ Inject 25 mg into the skin every Sunday.   03/19/2022   naloxone (NARCAN) nasal spray 4 mg/0.1 mL Place 0.4 mg into the nose once.   unknown   nystatin (MYCOSTATIN) 100000 UNIT/ML suspension Use as directed 5 mLs in the mouth or throat 4 (four) times daily.   Past Week   pantoprazole (PROTONIX) 40 MG  tablet Take 40 mg by mouth daily.   03/26/2022   Potassium Chloride Crys ER (K-DUR PO) Take 40 mEq by mouth every other day.   unknown   predniSONE (DELTASONE) 10 MG tablet Take 4 tabs (40mg ) daily for 2 days, then reduce to your home dose of 5mg  daily (Patient taking differently: Take 5 mg by mouth daily with breakfast.) 30 tablet 0 03/26/2022   rosuvastatin (CRESTOR) 10 MG tablet Take 10 mg by mouth at bedtime.   03/25/2022   sulfamethoxazole-trimethoprim (BACTRIM) 400-80 MG tablet Take 1 tablet by mouth 3 (three) times a week. Monday,Wednesday and Friday   03/24/2022   tapentadol HCl (NUCYNTA) 75 MG tablet Take 75 mg by mouth. Take 1 tablet in the morning, 1 tablet in the afternoon and 2 tablets at bedtime   03/26/2022   topiramate (TOPAMAX) 100 MG tablet Take 100 mg by mouth daily.   03/26/2022   vitamin B-12 (CYANOCOBALAMIN) 1000 MCG tablet Take 1,000 mcg by mouth daily.   unknown   Vitamin D, Ergocalciferol, (DRISDOL) 50000 units CAPS capsule Take 50,000 Units by mouth every Friday.   03/24/2022   oxyCODONE 10 MG TABS Take 1 tablet (10 mg total) by mouth every 4 (four) hours as needed for severe pain or breakthrough pain. (Patient not taking: Reported on 03/26/2022) 30 tablet 0 Not Taking   senna (SENOKOT) 8.6 MG TABS tablet Take 2 tablets (17.2 mg total) by mouth 2 (two) times daily. (Patient not taking: Reported on 03/26/2022) 30 tablet 0 Not Taking    Assessment: Pharmacy consulted to dose heparin in patient with pulmonary embolism, confirmed by CT.  Patient is not on anticoagulation prior to admission but received prophylaxis lovenox 6/5 1419.  6/5 PM update:  Heparin level is elevated  Goal of Therapy:  Heparin level 0.3-0.7 units/ml Monitor platelets by anticoagulation protocol: Yes   Plan:  Hold heparin x 1 hr Re-start heparin drip at 1100 units/hr  Re-check heparin level in 8 hours  05/26/2022, PharmD, BCPS Clinical Pharmacist Phone: [phone]

## 2022-03-27 NOTE — Progress Notes (Signed)
Pt taken to ct. NAD

## 2022-03-27 NOTE — Progress Notes (Signed)
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  The patient is unable to provide any history at this time secondary to her encephalopathy.  History is obtained from review of the medical record and speaking with the patient's spouse at the bedside.  According to the patient's spouse, the patient normally has episodes of "on and off confusion" for at least the past year.  He states that the patient was on a day trip with her son to [facility] on 03/25/2022.  Apparently, she had an episode of poor responsiveness after which she was back to her usual baseline.  He states that this has been normal for her for many months.  However, he has noted at the same time that she has been a bit more confused in the past week.  However when she woke up on the morning of 03/26/2022 the patient was more confused and agitated than usual.  The spouse took the patient's temperature with a transcutaneous thermometer, and it was 104.0 F at home.  He states that she has not been on any new medications.  There is been no history of vomiting, diarrhea, worsening shortness of breath.  However she has a chronic cough.  She did have a small amount of hemoptysis on 03/22/2022.  The patient has chronic pain and has an intrathecal pump infusing bupivacaine/ziconitide which was recently refilled on 03/02/2022 at [facility] pain clinic.  He states that the settings have not been changed.  He also states that the patient does not take more opioid medications than prescribed.  There is been no history of headaches, tonic-clonic activity, or syncope. Notably, the patient was recently hospitalized from 02/19/2022 to 02/23/2022  secondary to myositis of the gluteus medius.  She was discharged home with clindamycin and a steroid taper.  Spouse states that her hip pain has gradually improved since discharge from the hospital, and she is able to ambulate. In the ED, the patient had a temperature up to 103.5 F.  She was initially hypotensive with a blood pressure of 70/46.  Patient was given 3 L normal saline with improvement of her blood pressure of 97/55.  BMP showed sodium 141, potassium 3.6, bicarbonate 22, serum creatinine 1.11.  LFTs were unremarkable.  WBC 13.3, hemoglobin 9.8, platelets 247,000.  Lactic acid 2.0.  VBG showed 7.26/50/51/21 on room air.  CT of the brain was negative for any acute findings.  Chest x-ray showed bilateral infiltrates in the left upper lobe, right upper lobe, right middle lobe.  The patient was started on vancomycin and cefepime.     Assessment and Plan: * Severe sepsis ([place]) Secondary to pneumonia and UTI Chest x-ray shows multilobar infiltrates UA shows >50 WBC Continue vancomycin Continue Merrem Lactic acid peaked at 2.0 Check PCT .032>>0.74 Continue IV fluids  Presence of intrathecal pump Patient has intrathecal pump with bupivacaine and ziconotide,  --Last refill 03/02/2022  Stage 3a chronic kidney disease (HCC) Baseline creatinine 0.8-1.1 Monitor with serial BMP  Secondary adrenal insufficiency (HCC) She is chronically on prednisone 5 mg daily Methylprednisolone 125 mg IV given in the ED  Chemotherapy-induced peripheral neuropathy ([place]) Patient is on gabapentin, Cymbalta, amitriptyline topiramate, trazodone  UTI (urinary tract infection) UA > 50 WBC -  Continue empiric vancomycin and Merrem pending culture data  Lobar pneumonia ([place]) Suspect a component of aspiration pneumonitis Continue vancomycin Start Merrem MRSA screen neg, but had MRSA in sputum on 02/21/22  Granulomatosis with polyangiitis [facility]) Patient follows with rheumatology at [facility], Dr. Allean Found She  is on methotrexate every Sunday Rituximab currently on hold secondary to recent sepsis She is chronically on prednisone 5 mg daily On IV stress steroids during hospitalization  Class 2 obesity BMI 35.04 Lifestyle modification  Acute metabolic encephalopathy Secondary to sepsis in the setting of chronic opioid use 03/27/22--mental status back to baseline  Opioid dependence ([place]) Patient has intrathecal pump with bupivacaine and ziconotide,  --Last refill 03/02/2022 PDMP reviewed -- Patient receives Nucynta 75 mg, #120; last refill 03/17/2022 -- Patient is also on oxycodone 10 mg; #30, last refilled 02/23/2022      Family Communication:   spouse updated 6/5  Consultants:  none  Code Status:  FULL  DVT Prophylaxis:  [place] Lovenox   Procedures: As Listed in Progress Note Above  Antibiotics: Vanco 6/4>> Merrem 6/4>>  Subjective: Patient states sob is feeling better.  Denies n/v/d, abd pain, cp.  Has some hemoptysis   Objective: Vitals:   03/27/22 1100 03/27/22 1124 03/27/22 1300 03/27/22 1323  BP: (!) 156/80  (!) 148/79   Pulse: 99   80  Resp: 19  (!) 21 (!) 28  Temp:  98.3 F (36.8 C)    TempSrc:  Oral    SpO2: 92%   94%  Weight:      Height:        Intake/Output Summary (Last 24 hours) at 03/27/2022 1347 Last data filed at 03/27/2022 1000 Gross per 24 hour  Intake 4876.35 ml  Output 4900 ml  Net -23.65 ml   Weight change:  Exam:  General:  Pt is alert, follows commands appropriately, not in acute distress HEENT: No icterus, No thrush, No neck mass, [place]/AT Cardiovascular: RRR, S1/S2, no rubs, no gallops Respiratory: bilateral scattered rales. No wheeze Abdomen: Soft/+BS, non tender, non distended, no guarding Extremities: No edema, No lymphangitis, No petechiae, No rashes, no synovitis   Data Reviewed: I have personally reviewed following labs and imaging studies Basic Metabolic Panel: Recent Labs  Lab 03/26/22 1007 03/27/22 0535  NA 141 143  K 3.6  3.8  CL 114* 122*  CO2 22 20*  GLUCOSE 140* 118*  BUN 12 14  CREATININE 1.11* 0.86  CALCIUM 7.9* 7.2*   Liver Function Tests: Recent Labs  Lab 03/26/22 1007 03/27/22 0535  AST 27 23  ALT 18 17  ALKPHOS 87 67  BILITOT 0.4 0.6  PROT 5.1* 4.7*  ALBUMIN 2.6* 2.4*   No results for input(s): LIPASE, AMYLASE in the last 168 hours. No results for input(s): AMMONIA in the last 168 hours. Coagulation Profile: Recent Labs  Lab 03/26/22 1007  INR 1.1   CBC: Recent Labs  Lab 03/26/22 1007 03/27/22 0535  WBC 13.3* 17.0*  NEUTROABS 9.6*  --   HGB 9.8* 8.5*  HCT 33.4* 29.4*  MCV 87.2 88.6  PLT 247 210   Cardiac Enzymes: No results for input(s): CKTOTAL, CKMB, CKMBINDEX, TROPONINI in the last 168 hours. BNP: Invalid input(s): POCBNP CBG: Recent Labs  Lab 03/26/22 0937 03/26/22 1631 03/26/22 2119 03/27/22 0732 03/27/22 1123  GLUCAP 137* 218* 227* 84 135*   HbA1C: Recent Labs    03/26/22 1027  HGBA1C 8.6*   Urine analysis:    Component Value Date/Time   COLORURINE YELLOW 03/26/2022 1214  APPEARANCEUR CLEAR 03/26/2022 1214   LABSPEC 1.006 03/26/2022 1214   PHURINE 6.0 03/26/2022 [place] 03/26/2022 1214   HGBUR NEGATIVE 03/26/2022 [place] 03/26/2022 1214   KETONESUR NEGATIVE 03/26/2022 1214   PROTEINUR NEGATIVE 03/26/2022 1214   UROBILINOGEN 0.2 05/30/2013 1112   NITRITE POSITIVE (A) 03/26/2022 1214   LEUKOCYTESUR MODERATE (A) 03/26/2022 1214   Sepsis Labs: @LABRCNTIP (procalcitonin:4,lacticidven:4) ) Recent Results (from the past 240 hour(s))  Blood Culture (routine x 2)     Status: None (Preliminary result)   Collection Time: 03/26/22 10:07 AM   Specimen: BLOOD LEFT FOREARM  Result Value Ref Range Status   Specimen Description BLOOD LEFT FOREARM  Final   Special Requests Blood Culture adequate volume  Final   Culture   Final    NO GROWTH < 24 HOURS Performed at [facility], [address] [place], [place]  [address]    Report Status PENDING  Incomplete  Blood Culture (routine x 2)     Status: None (Preliminary result)   Collection Time: 03/26/22 10:27 AM   Specimen: Left Antecubital; Blood  Result Value Ref Range Status   Specimen Description   Final    LEFT ANTECUBITAL BOTTLES DRAWN AEROBIC AND ANAEROBIC   Special Requests Blood Culture adequate volume  Final   Culture   Final    NO GROWTH < 24 HOURS Performed at [facility], [address], [place], [place] [address]    Report Status PENDING  Incomplete  Resp Panel by RT-PCR (Flu A&B, Covid) Anterior Nasal Swab     Status: None   Collection Time: 03/26/22 12:19 PM   Specimen: Anterior Nasal Swab  Result Value Ref Range Status   SARS Coronavirus 2 by RT PCR NEGATIVE NEGATIVE Final    Comment: (NOTE) SARS-CoV-2 target nucleic acids are NOT DETECTED.  The SARS-CoV-2 RNA is generally detectable in upper respiratory specimens during the acute phase of infection. The lowest concentration of SARS-CoV-2 viral copies this assay can detect is 138 copies/mL. A negative result does not preclude SARS-Cov-2 infection and should not be used as the sole basis for treatment or other patient management decisions. A negative result may occur with  improper specimen collection/handling, submission of specimen other than nasopharyngeal swab, presence of viral mutation(s) within the areas targeted by this assay, and inadequate number of viral copies(<138 copies/mL). A negative result must be combined with clinical observations, patient history, and epidemiological information. The expected result is Negative.  Fact Sheet for Patients:  EntrepreneurPulse.com.au  Fact Sheet for Healthcare Providers:  IncredibleEmployment.be  This test is no t yet approved or cleared by the [place] FDA and  has been authorized for detection and/or diagnosis of SARS-CoV-2 by FDA under an Emergency Use Authorization (EUA). This  EUA will remain  in effect (meaning this test can be used) for the duration of the COVID-19 declaration under Section 564(b)(1) of the Act, 21 U.S.C.section 360bbb-3(b)(1), unless the authorization is terminated  or revoked sooner.       Influenza A by PCR NEGATIVE NEGATIVE Final   Influenza B by PCR NEGATIVE NEGATIVE Final    Comment: (NOTE) The Xpert Xpress SARS-CoV-2/FLU/RSV plus assay is intended as an aid in the diagnosis of influenza from Nasopharyngeal swab specimens and should not be used as a sole basis for treatment. Nasal washings and aspirates are unacceptable for Xpert Xpress SARS-CoV-2/FLU/RSV testing.  Fact Sheet for Patients: EntrepreneurPulse.com.au  Fact Sheet for Healthcare Providers: IncredibleEmployment.be  This test is  not yet approved or cleared by the [place] FDA and has been authorized for detection and/or diagnosis of SARS-CoV-2 by FDA under an Emergency Use Authorization (EUA). This EUA will remain in effect (meaning this test can be used) for the duration of the COVID-19 declaration under Section 564(b)(1) of the Act, 21 U.S.C. section 360bbb-3(b)(1), unless the authorization is terminated or revoked.  Performed at [facility], [address] [place], [place] [address]   MRSA Next Gen by PCR, Nasal     Status: None   Collection Time: 03/26/22  2:10 PM   Specimen: Nasal Mucosa; Nasal Swab  Result Value Ref Range Status   MRSA by PCR Next Gen NOT DETECTED NOT DETECTED Final    Comment: (NOTE) The GeneXpert MRSA Assay (FDA approved for NASAL specimens only), is one component of a comprehensive MRSA colonization surveillance program. It is not intended to diagnose MRSA infection nor to guide or monitor treatment for MRSA infections. Test performance is not FDA approved in patients less than 56 years old. Performed at [facility], [address] [place], [place] [address]      Scheduled Meds:   amitriptyline  50 mg Oral QHS   budesonide (PULMICORT) nebulizer solution  0.5 mg Nebulization BID   Chlorhexidine Gluconate Cloth  6 each Topical Daily   DULoxetine  60 mg Oral Daily   enoxaparin (LOVENOX) injection  40 mg Subcutaneous Q24H   folic acid  2 mg Oral Daily   gabapentin  1,200 mg Oral QHS   gabapentin  600 mg Oral Q lunch   [START ON 03/28/2022] gabapentin  900 mg Oral q AM   insulin aspart  0-9 Units Subcutaneous TID WC   ipratropium-albuterol  3 mL Nebulization Q6H   linaclotide  145 mcg Oral QAC breakfast   methylPREDNISolone (SOLU-MEDROL) injection  40 mg Intravenous Daily   pantoprazole  40 mg Oral Daily   rosuvastatin  10 mg Oral QHS   sulfamethoxazole-trimethoprim  1 tablet Oral Once per day on Mon Wed Fri   vitamin B-12  1,000 mcg Oral Daily   Continuous Infusions:  sodium chloride 125 mL/hr at 03/27/22 0554   meropenem (MERREM) IV 1 g (03/27/22 1311)   vancomycin      Procedures/Studies: CT Head Wo Contrast  Result Date: 03/26/2022 CLINICAL DATA:  Altered mental status.  Recent stroke. EXAM: CT HEAD WITHOUT CONTRAST TECHNIQUE: Contiguous axial images were obtained from the base of the skull through the vertex without intravenous contrast. RADIATION DOSE REDUCTION: This exam was performed according to the departmental dose-optimization program which includes automated exposure control, adjustment of the mA and/or kV according to patient size and/or use of iterative reconstruction technique. COMPARISON:  11/01/2020 FINDINGS: Brain: No evidence of intracranial hemorrhage, acute infarction, hydrocephalus, extra-axial collection, or mass lesion/mass effect. 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 Vascular:  No hyperdense vessel or other acute findings. Skull: No evidence of fracture or other significant bone abnormality. Sinuses/Orbits:  No acute findings. Other: None. IMPRESSION: No acute  intracranial abnormality. Electronically Signed   By: Danae OrleansJohn A Stahl M.D.   On: 03/26/2022 11:42   DG Chest Port 1 View  Result Date: 03/26/2022 CLINICAL DATA:  Sepsis. EXAM: PORTABLE CHEST 1 VIEW COMPARISON:  February 19, 2022 FINDINGS: There is infiltrate in the right mid lung. There may be infiltrate in the medial left upper lung. No pneumothorax. No other acute interval changes. The study is limited however due to  portable technique. IMPRESSION: The study is limited due to low volume portable technique. However, there is an infiltrate in the right mid lung consistent with for pneumonia given history. There may be a more subtle smaller infiltrate in the medial left upper lung. Electronically Signed   By: Dorise Bullion III M.D.   On: 03/26/2022 10:22    Orson Eva, DO  [facility]  If 7PM-7AM, please contact night-coverage [email or web address] Password [email or web address] 03/27/2022, 1:47 PM   LOS: 1 day

## 2022-03-28 ENCOUNTER — Inpatient Hospital Stay (HOSPITAL_COMMUNITY): Payer: BC Managed Care – PPO

## 2022-03-28 DIAGNOSIS — J189 Pneumonia, unspecified organism: Secondary | ICD-10-CM | POA: Diagnosis not present

## 2022-03-28 DIAGNOSIS — I10 Essential (primary) hypertension: Secondary | ICD-10-CM

## 2022-03-28 DIAGNOSIS — M313 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: Secondary | ICD-10-CM | POA: Diagnosis not present

## 2022-03-28 DIAGNOSIS — G9341 Metabolic encephalopathy: Secondary | ICD-10-CM | POA: Diagnosis not present

## 2022-03-28 DIAGNOSIS — I2699 Other pulmonary embolism without acute cor pulmonale: Secondary | ICD-10-CM

## 2022-03-28 DIAGNOSIS — A419 Sepsis, unspecified organism: Secondary | ICD-10-CM | POA: Diagnosis not present

## 2022-03-28 DIAGNOSIS — G620 Drug-induced polyneuropathy: Secondary | ICD-10-CM | POA: Diagnosis not present

## 2022-03-28 LAB — ECHOCARDIOGRAM COMPLETE
AR max vel: 2.38 cm2
AV Area VTI: 2.22 cm2
AV Area mean vel: 2.3 cm2
AV Mean grad: 5 mmHg
AV Peak grad: 10.8 mmHg
Ao pk vel: 1.64 m/s
Area-P 1/2: 3.68 cm2
Calc EF: 56 %
Height: 64 in
MV VTI: 2.13 cm2
S' Lateral: 3.5 cm
Single Plane A2C EF: 59.5 %
Single Plane A4C EF: 51.8 %
Weight: 3382.74 oz

## 2022-03-28 LAB — HEPARIN LEVEL (UNFRACTIONATED)
Heparin Unfractionated: 0.94 IU/mL — ABNORMAL HIGH (ref 0.30–0.70)
Heparin Unfractionated: 0.63 IU/mL (ref 0.30–0.70)
Heparin Unfractionated: 0.62 IU/mL (ref 0.30–0.70)

## 2022-03-28 LAB — BASIC METABOLIC PANEL
Anion gap: <3 — ABNORMAL LOW (ref 5–15)
BUN: 11 mg/dL (ref 6–20)
CO2: 18 mmol/L — ABNORMAL LOW (ref 22–32)
Calcium: 7.3 mg/dL — ABNORMAL LOW (ref 8.9–10.3)
Chloride: 121 mmol/L — ABNORMAL HIGH (ref 98–111)
Creatinine, Ser: 0.69 mg/dL (ref 0.44–1.00)
GFR, Estimated: >60 mL/min (ref >60)
Glucose, Bld: 148 mg/dL — ABNORMAL HIGH (ref 70–99)
Potassium: 3.1 mmol/L — ABNORMAL LOW (ref 3.5–5.1)
Sodium: 141 mmol/L (ref 135–145)

## 2022-03-28 LAB — CBC
HCT: 26.6 % — ABNORMAL LOW (ref 36.0–46.0)
Hemoglobin: 7.8 g/dL — ABNORMAL LOW (ref 12.0–15.0)
MCH: 25.7 pg — ABNORMAL LOW (ref 26.0–34.0)
MCHC: 29.3 g/dL — ABNORMAL LOW (ref 30.0–36.0)
MCV: 87.5 fL (ref 80.0–100.0)
Platelets: 228 10*3/uL (ref 150–400)
RBC: 3.04 MIL/uL — ABNORMAL LOW (ref 3.87–5.11)
RDW: 22.4 % — ABNORMAL HIGH (ref 11.5–15.5)
WBC: 13.8 10*3/uL — ABNORMAL HIGH (ref 4.0–10.5)
nRBC: 0.1 % (ref 0.0–0.2)

## 2022-03-28 LAB — GLUCOSE, CAPILLARY
Glucose-Capillary: 114 mg/dL — ABNORMAL HIGH (ref 70–99)
Glucose-Capillary: 94 mg/dL (ref 70–99)
Glucose-Capillary: 262 mg/dL — ABNORMAL HIGH (ref 70–99)
Glucose-Capillary: 210 mg/dL — ABNORMAL HIGH (ref 70–99)

## 2022-03-28 LAB — TROPONIN I (HIGH SENSITIVITY)
Troponin I (High Sensitivity): 8 ng/L (ref <18)
Troponin I (High Sensitivity): 10 ng/L (ref <18)

## 2022-03-28 LAB — MAGNESIUM: Magnesium: 2 mg/dL (ref 1.7–2.4)

## 2022-03-28 MED ORDER — IPRATROPIUM-ALBUTEROL 0.5-2.5 (3) MG/3ML IN SOLN: 3.0000 mL | RESPIRATORY_TRACT | Status: DC | PRN

## 2022-03-28 MED ORDER — POTASSIUM CHLORIDE CRYS ER 20 MEQ PO TBCR
40.0000 meq | EXTENDED_RELEASE_TABLET | Freq: Once | ORAL | Status: AC
  Administered 2022-03-28: 40 meq via ORAL
  Filled 2022-03-28: qty 2

## 2022-03-28 MED ORDER — IPRATROPIUM-ALBUTEROL 0.5-2.5 (3) MG/3ML IN SOLN
3.0000 mL | Freq: Three times a day (TID) | RESPIRATORY_TRACT | Status: DC
  Administered 2022-03-29: 3 mL via RESPIRATORY_TRACT
  Filled 2022-03-28: qty 3

## 2022-03-28 MED ORDER — FUROSEMIDE 10 MG/ML IJ SOLN
40.0000 mg | Freq: Once | INTRAMUSCULAR | Status: AC
  Administered 2022-03-28: 40 mg via INTRAVENOUS
  Filled 2022-03-28: qty 4

## 2022-03-28 MED ORDER — PREDNISONE 20 MG PO TABS
20.0000 mg | ORAL_TABLET | Freq: Every day | ORAL | Status: DC
  Administered 2022-03-29 – 2022-04-05 (×8): 20 mg via ORAL
  Filled 2022-03-28 (×8): qty 1

## 2022-03-28 NOTE — Progress Notes (Signed)
*  PRELIMINARY RESULTS* Echocardiogram 2D Echocardiogram has been performed.  Carolyne Fiscal 03/28/2022, 9:21 AM

## 2022-03-28 NOTE — Progress Notes (Signed)
ANTICOAGULATION CONSULT NOTE  Pharmacy Consult for heparin Indication: pulmonary embolus  Allergies  Allergen Reactions   Ibuprofen Other (See Comments)    Pt is unable to take this due to kidney problems.      Penicillins Rash and Other (See Comments)    Has patient had a PCN reaction causing immediate rash, facial/tongue/throat swelling, SOB or lightheadedness with hypotension: No Has patient had a PCN reaction causing severe rash involving mucus membranes or skin necrosis: No Has patient had a PCN reaction that required hospitalization No Has patient had a PCN reaction occurring within the last 10 years: No If all of the above answers are "NO", then may proceed with Cephalosporin use.    Patient Measurements: Height: 5\' 4"  (162.6 cm) Weight: 95.9 kg (211 lb 6.7 oz) IBW/kg (Calculated) : 54.7 Heparin Dosing Weight: 76 kg  Vital Signs: Temp: 99.4 F (37.4 C) (06/06 0951) Temp Source: Oral (06/06 0755) BP: 157/98 (06/06 1144) Pulse Rate: 91 (06/06 1144)  Labs: Recent Labs    03/26/22 1007 03/27/22 0535 03/27/22 2222 03/28/22 0402 03/28/22 0753 03/28/22 1317 03/28/22 1545  HGB 9.8* 8.5*  --  7.8*  --   --   --   HCT 33.4* 29.4*  --  26.6*  --   --   --   PLT 247 210  --  228  --   --   --   APTT 28  --   --   --   --   --   --   LABPROT 14.1  --   --   --   --   --   --   INR 1.1  --   --   --   --   --   --   HEPARINUNFRC  --   --  >1.10*  --  0.94*  --  0.63  CREATININE 1.11* 0.86  --  0.69  --   --   --   TROPONINIHS  --   --   --   --  8 10  --      Estimated Creatinine Clearance: 88.3 mL/min (by C-G formula based on SCr of 0.69 mg/dL).   Medical History: Past Medical History:  Diagnosis Date   Chronic cough    Chronic pain    Diabetes mellitus without complication (HCC)    Dyspnea    GERD (gastroesophageal reflux disease)    Hypokalemia    Hypothyroidism    Left wrist pain 06/01/2021   Myonecrosis (HCC) 07/01/2021   Neurocardiogenic syncope    OSA  (obstructive sleep apnea)    does not use CPAP   Osteomyelitis (HCC)    bilateral feet   Peripheral neuropathy    Presence of intrathecal pump    recieves Prialt/bupivicaine   Tear of gluteus medius tendon 07/01/2021   Wegener's granulomatosis 2009   Wegner's disease (congenital syphilitic osteochondritis)     Medications:  Medications Prior to Admission  Medication Sig Dispense Refill Last Dose   acetaminophen (TYLENOL) 325 MG tablet Take 2 tablets (650 mg total) by mouth every 6 (six) hours as needed for mild pain (or Fever >/= 101).   unknown   albuterol (PROVENTIL HFA;VENTOLIN HFA) 108 (90 Base) MCG/ACT inhaler Inhale 2 puffs into the lungs every 6 (six) hours as needed for wheezing or shortness of breath.   unknown   amitriptyline (ELAVIL) 50 MG tablet Take 50 mg by mouth at bedtime.   03/25/2022   Calcium Carb-Cholecalciferol (CALCIUM CARBONATE-VITAMIN  D3 PO) Take 1 tablet by mouth daily.   unknown   DULoxetine (CYMBALTA) 60 MG capsule Take 60 mg by mouth daily.   03/26/2022   folic acid (FOLVITE) 1 MG tablet Take 2 mg by mouth daily.   unknown   gabapentin (NEURONTIN) 300 MG capsule Take 600-1,200 mg by mouth See admin instructions. Take 3 (900 mg total) capsules by mouth in the morning, 2 (600 mg) capsule in the afternoon and 4 (1200 mg) capsules in the evening.   03/26/2022   lidocaine (LIDODERM) 5 % Place 1 patch onto the skin daily as needed (for pain). Remove & Discard patch within 12 hours or as directed by MD   03/25/2022   linaclotide (LINZESS) 145 MCG CAPS capsule Take 145 mcg by mouth daily before breakfast.   03/26/2022   Melatonin 10 MG TABS Take 10 mg by mouth at bedtime as needed (sleep).   03/25/2022   metFORMIN (GLUCOPHAGE-XR) 500 MG 24 hr tablet Take 500 mg by mouth daily with breakfast.   03/26/2022   methocarbamol (ROBAXIN) 500 MG tablet Take 500 mg by mouth every 6 (six) hours as needed for muscle spasms.   03/25/2022   METHOTREXATE SODIUM IJ Inject 25 mg into the skin every  Sunday.   03/19/2022   naloxone (NARCAN) nasal spray 4 mg/0.1 mL Place 0.4 mg into the nose once.   unknown   nystatin (MYCOSTATIN) 100000 UNIT/ML suspension Use as directed 5 mLs in the mouth or throat 4 (four) times daily.   Past Week   pantoprazole (PROTONIX) 40 MG tablet Take 40 mg by mouth daily.   03/26/2022   Potassium Chloride Crys ER (K-DUR PO) Take 40 mEq by mouth every other day.   unknown   predniSONE (DELTASONE) 10 MG tablet Take 4 tabs (40mg ) daily for 2 days, then reduce to your home dose of 5mg  daily (Patient taking differently: Take 5 mg by mouth daily with breakfast.) 30 tablet 0 03/26/2022   rosuvastatin (CRESTOR) 10 MG tablet Take 10 mg by mouth at bedtime.   03/25/2022   sulfamethoxazole-trimethoprim (BACTRIM) 400-80 MG tablet Take 1 tablet by mouth 3 (three) times a week. Monday,Wednesday and Friday   03/24/2022   tapentadol HCl (NUCYNTA) 75 MG tablet Take 75 mg by mouth. Take 1 tablet in the morning, 1 tablet in the afternoon and 2 tablets at bedtime   03/26/2022   topiramate (TOPAMAX) 100 MG tablet Take 100 mg by mouth daily.   03/26/2022   vitamin B-12 (CYANOCOBALAMIN) 1000 MCG tablet Take 1,000 mcg by mouth daily.   unknown   Vitamin D, Ergocalciferol, (DRISDOL) 50000 units CAPS capsule Take 50,000 Units by mouth every Friday.   03/24/2022   oxyCODONE 10 MG TABS Take 1 tablet (10 mg total) by mouth every 4 (four) hours as needed for severe pain or breakthrough pain. (Patient not taking: Reported on 03/26/2022) 30 tablet 0 Not Taking   senna (SENOKOT) 8.6 MG TABS tablet Take 2 tablets (17.2 mg total) by mouth 2 (two) times daily. (Patient not taking: Reported on 03/26/2022) 30 tablet 0 Not Taking    Assessment: Pharmacy consulted to dose heparin in patient with pulmonary embolism, confirmed by CT.  Patient is not on anticoagulation prior to admission but received prophylaxis lovenox 6/5 1419.  HL 0.63- therapeutic  Hgb 19.8 >8.5  > 7.8  Goal of Therapy:  Heparin level 0.3-0.7  units/ml Monitor platelets by anticoagulation protocol: Yes   Plan:  Continue heparin infusion at 900 units/hr. Check  anti-Xa level in 6 hours and daily Continue to monitor H&H and platelets.   Judeth CornfieldSteven Habiba Treloar, PharmD Clinical Pharmacist 03/28/2022 4:32 PM

## 2022-03-28 NOTE — Plan of Care (Signed)
  Problem: Acute Rehab PT Goals(only PT should resolve) Goal: Pt Will Go Supine/Side To Sit Outcome: Progressing Flowsheets (Taken 03/28/2022 1230) Pt will go Supine/Side to Sit: with modified independence Goal: Patient Will Transfer Sit To/From Stand Outcome: Progressing Flowsheets (Taken 03/28/2022 1230) Patient will transfer sit to/from stand:  with modified independence  with supervision Goal: Pt Will Transfer Bed To Chair/Chair To Bed Outcome: Progressing Flowsheets (Taken 03/28/2022 1230) Pt will Transfer Bed to Chair/Chair to Bed:  with supervision  with modified independence Goal: Pt Will Perform Standing Balance Or Pre-Gait Outcome: Progressing Flowsheets (Taken 03/28/2022 1230) Pt will perform standing balance or pre-gait:  1-2 min  with no UE support  with Supervision  with min guard assist Goal: Pt Will Ambulate Outcome: Progressing Flowsheets (Taken 03/28/2022 1230) Pt will Ambulate:  75 feet  with cane  with min guard assist  with minimal assist   12:31 PM, 03/28/22 Arther Abbott, S/PT

## 2022-03-28 NOTE — Progress Notes (Addendum)
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  The patient is unable to provide any history at this time secondary to her encephalopathy.  History is obtained from review of the medical record and speaking with the patient's spouse at the bedside.  According to the patient's spouse, the patient normally has episodes of "on and off confusion" for at least the past year.  He states that the patient was on a day trip with her son to [facility] on 03/25/2022.  Apparently, she had an episode of poor responsiveness after which she was back to her usual baseline.  He states that this has been normal for her for many months.  However, he has noted at the same time that she has been a bit more confused in the past week.  However when she woke up on the morning of 03/26/2022 the patient was more confused and agitated than usual.  The spouse took the patient's temperature with a transcutaneous thermometer, and it was 104.0 F at home.  He states that she has not been on any new medications.  There is been no history of vomiting, diarrhea, worsening shortness of breath.  However she has a chronic cough.  She did have a small amount of hemoptysis on 03/22/2022.  The patient has chronic pain and has an intrathecal pump infusing bupivacaine/ziconitide which was recently refilled on 03/02/2022 at [facility] pain clinic.  He states that the settings have not been changed.  He also states that the patient does not take more opioid medications than prescribed.  There is been no history of headaches, tonic-clonic activity, or syncope. Notably, the patient was recently hospitalized from 02/19/2022 to 02/23/2022  secondary to myositis of the gluteus medius.  She was discharged home with clindamycin and a steroid taper.  Spouse states that her hip pain has gradually improved since discharge from the hospital, and she is able to ambulate. In the ED, the patient had a temperature up to 103.5 F.  She was initially hypotensive with a blood pressure of 70/46.  Patient was given 3 L normal saline with improvement of her blood pressure of 97/55.  BMP showed sodium 141, potassium 3.6, bicarbonate 22, serum creatinine 1.11.  LFTs were unremarkable.  WBC 13.3, hemoglobin 9.8, platelets 247,000.  Lactic acid 2.0.  VBG showed 7.26/50/51/21 on room air.  CT of the brain was negative for any acute findings.  Chest x-ray showed bilateral infiltrates in the left upper lobe, right upper lobe, right middle lobe.  The patient was started on vancomycin and cefepime.    Assessment and Plan: * Severe sepsis ([place]) Secondary to pneumonia and UTI Chest x-ray shows multilobar infiltrates UA shows >50 WBC Continue vancomycin Continue Merrem Lactic acid peaked at 2.0 Check PCT .032>>0.74>>0.50 Continue IV fluids>>saline lock Sepsis physiology resolved --6/6>>give lasix 40 mg IV x 1 --6/6>>stop IVF  Presence of intrathecal pump Patient has intrathecal pump with bupivacaine and ziconotide,  --Last refill 03/02/2022  Stage 3a chronic kidney disease (HCC) Baseline creatinine 0.8-1.1 Monitor with serial BMP  Secondary adrenal insufficiency ([place]) She is chronically on prednisone 5 mg daily Methylprednisolone 125 mg IV given in the ED, then 40 mg IV daily --wean to prednisone  Chemotherapy-induced  peripheral neuropathy ([place]) Patient is on gabapentin, Cymbalta, amitriptyline topiramate, trazodone  UTI (urinary tract infection) UA > 50 WBC -culture = Ecoli -continue merrem  Lobar pneumonia ([place]) Suspect a component of aspiration pneumonitis Continue vancomycin continue Merrem MRSA screen neg, but had MRSA in sputum on  02/21/22 --6/6>>give lasix 40 mg IV x 1 --6/6>>stop IVF  Granulomatosis with polyangiitis [facility]) Patient follows with rheumatology at [facility], Dr. Allean Found She is on methotrexate every Sunday Rituximab currently on hold secondary to recent sepsis She is chronically on prednisone 5 mg daily On IV stress steroids during hospitalization>>wean to po prednisone --requesting pulmonary consult  Class 2 obesity BMI 35.04 Lifestyle modification  Acute pulmonary embolus ([place]) 6/5 CTA chest--Small pulmonary emboli in RIGHT lung --started IV heparin --continues to have small volume hemoptysis --Echo EF 55%, no RV strain --requesting pulmonary consult  Acute metabolic encephalopathy Secondary to sepsis in the setting of chronic opioid use 03/27/22--mental status back to baseline  Opioid dependence ([place]) Patient has intrathecal pump with bupivacaine and ziconotide,  --Last refill 03/02/2022 PDMP reviewed -- Patient receives Nucynta 75 mg, #120; last refill 03/17/2022 -- Patient is also on oxycodone 10 mg; #30, last refilled 02/23/2022      Family Communication:   spouse updated 6/6  Consultants:  pulmonary  Code Status:  FULL   DVT Prophylaxis:  IV Heparin   Procedures: As Listed in Progress Note Above  Antibiotics: Vanco 6/4>> Merrem 6/4>>    Subjective: Patient complains of chest pain and sob.  Denies n/v/d, abd pain, f/c  Objective: Vitals:   03/28/22 0951 03/28/22 1059 03/28/22 1144 03/28/22 1308  BP: (!) 152/99 (!) 149/90 (!) 157/98   Pulse: 86 89 91   Resp: 18 20 (!) 24   Temp: 99.4 F (37.4 C)     TempSrc:      SpO2: 96% 100% 98% 99%  Weight:      Height:        Intake/Output Summary (Last 24 hours) at 03/28/2022 1356 Last data filed at 03/28/2022 0900 Gross per 24 hour  Intake 2347.14 ml  Output 3 ml  Net 2344.14 ml   Weight change: 3.3 kg Exam:  General:  Pt is alert, follows commands appropriately, not in acute distress HEENT: No icterus, No thrush,  No neck mass, [place]/AT Cardiovascular: RRR, S1/S2, no rubs, no gallops Respiratory: bilateral rales.  No wheeze Abdomen: Soft/+BS, non tender, non distended, no guarding Extremities: trace LE edema, No lymphangitis, No petechiae, No rashes, no synovitis   Data Reviewed: I have personally reviewed following labs and imaging studies Basic Metabolic Panel: Recent Labs  Lab 03/26/22 1007 03/27/22 0535 03/28/22 0402  NA 141 143 141  K 3.6 3.8 3.1*  CL 114* 122* 121*  CO2 22 20* 18*  GLUCOSE 140* 118* 148*  BUN 12 14 11   CREATININE 1.11* 0.86 0.69  CALCIUM 7.9* 7.2* 7.3*  MG  --   --  2.0   Liver Function Tests: Recent Labs  Lab 03/26/22 1007 03/27/22 0535  AST 27 23  ALT 18 17  ALKPHOS 87 67  BILITOT 0.4 0.6  PROT 5.1* 4.7*  ALBUMIN 2.6* 2.4*   No results for input(s): LIPASE, AMYLASE in the last 168 hours. No results for input(s): AMMONIA in the last 168 hours. Coagulation Profile: Recent Labs  Lab 03/26/22 1007  INR 1.1   CBC: Recent Labs  Lab 03/26/22 1007 03/27/22 0535 03/28/22 0402  WBC 13.3* 17.0* 13.8*  NEUTROABS 9.6*  --   --  HGB 9.8* 8.5* 7.8*  HCT 33.4* 29.4* 26.6*  MCV 87.2 88.6 87.5  PLT 247 210 228   Cardiac Enzymes: No results for input(s): CKTOTAL, CKMB, CKMBINDEX, TROPONINI in the last 168 hours. BNP: Invalid input(s): POCBNP CBG: Recent Labs  Lab 03/27/22 1123 03/27/22 1621 03/27/22 2104 03/28/22 0722 03/28/22 1102  GLUCAP 135* 184* 197* 114* 94   HbA1C: Recent Labs    03/26/22 1027  HGBA1C 8.6*   Urine analysis:    Component Value Date/Time   COLORURINE YELLOW 03/26/2022 [place] 03/26/2022 1214   LABSPEC 1.006 03/26/2022 1214   PHURINE 6.0 03/26/2022 1214   GLUCOSEU NEGATIVE 03/26/2022 1214   HGBUR NEGATIVE 03/26/2022 1214   BILIRUBINUR NEGATIVE 03/26/2022 1214   KETONESUR NEGATIVE 03/26/2022 1214   PROTEINUR NEGATIVE 03/26/2022 1214   UROBILINOGEN 0.2 05/30/2013 1112   NITRITE POSITIVE (A)  03/26/2022 1214   LEUKOCYTESUR MODERATE (A) 03/26/2022 1214   Sepsis Labs: @LABRCNTIP (procalcitonin:4,lacticidven:4) ) Recent Results (from the past 240 hour(s))  Blood Culture (routine x 2)     Status: None (Preliminary result)   Collection Time: 03/26/22 10:07 AM   Specimen: BLOOD LEFT FOREARM  Result Value Ref Range Status   Specimen Description BLOOD LEFT FOREARM  Final   Special Requests Blood Culture adequate volume  Final   Culture   Final    NO GROWTH 2 DAYS Performed at [facility], [address] [place], [place] [address]    Report Status PENDING  Incomplete  Blood Culture (routine x 2)     Status: None (Preliminary result)   Collection Time: 03/26/22 10:27 AM   Specimen: Left Antecubital; Blood  Result Value Ref Range Status   Specimen Description   Final    LEFT ANTECUBITAL BOTTLES DRAWN AEROBIC AND ANAEROBIC   Special Requests Blood Culture adequate volume  Final   Culture   Final    NO GROWTH 2 DAYS Performed at [facility], [address], [place], [place] [address]    Report Status PENDING  Incomplete  Urine Culture     Status: Abnormal (Preliminary result)   Collection Time: 03/26/22 12:14 PM   Specimen: Urine, Catheterized  Result Value Ref Range Status   Specimen Description   Final    URINE, CATHETERIZED Performed at [facility], [address] [place], [place] [address]    Special Requests   Final    NONE Performed at [facility], [address] [place], [place] [address]    Culture (A)  Final    40,000 COLONIES/mL ESCHERICHIA COLI CULTURE REINCUBATED FOR BETTER GROWTH Performed at [facility], [place] [address], [place],  [address]    Report Status PENDING  Incomplete   Organism ID, Bacteria ESCHERICHIA COLI (A)  Final      Susceptibility   Escherichia coli - MIC*    AMPICILLIN >=32 RESISTANT Resistant     CEFAZOLIN >=64 RESISTANT Resistant     CEFEPIME <=0.12 SENSITIVE Sensitive     CEFTRIAXONE 32 RESISTANT Resistant      CIPROFLOXACIN >=4 RESISTANT Resistant     GENTAMICIN <=1 SENSITIVE Sensitive     IMIPENEM 0.5 SENSITIVE Sensitive     NITROFURANTOIN <=16 SENSITIVE Sensitive     TRIMETH/SULFA >=320 RESISTANT Resistant     AMPICILLIN/SULBACTAM >=32 RESISTANT Resistant     PIP/TAZO >=128 RESISTANT Resistant     * 40,000 COLONIES/mL ESCHERICHIA COLI  Resp Panel by RT-PCR (Flu A&B, Covid) Anterior Nasal Swab     Status: None  Collection Time: 03/26/22 12:19 PM   Specimen: Anterior Nasal Swab  Result Value Ref Range Status   SARS Coronavirus 2 by RT PCR NEGATIVE NEGATIVE Final    Comment: (NOTE) SARS-CoV-2 target nucleic acids are NOT DETECTED.  The SARS-CoV-2 RNA is generally detectable in upper respiratory specimens during the acute phase of infection. The lowest concentration of SARS-CoV-2 viral copies this assay can detect is 138 copies/mL. A negative result does not preclude SARS-Cov-2 infection and should not be used as the sole basis for treatment or other patient management decisions. A negative result may occur with  improper specimen collection/handling, submission of specimen other than nasopharyngeal swab, presence of viral mutation(s) within the areas targeted by this assay, and inadequate number of viral copies(<138 copies/mL). A negative result must be combined with clinical observations, patient history, and epidemiological information. The expected result is Negative.  Fact Sheet for Patients:  EntrepreneurPulse.com.au  Fact Sheet for Healthcare Providers:  IncredibleEmployment.be  This test is no t yet approved or cleared by the [place] FDA and  has been authorized for detection and/or diagnosis of SARS-CoV-2 by FDA under an Emergency Use Authorization (EUA). This EUA will remain  in effect (meaning this test can be used) for the duration of the COVID-19 declaration under Section 564(b)(1) of the Act, 21 U.S.C.section 360bbb-3(b)(1),  unless the authorization is terminated  or revoked sooner.       Influenza A by PCR NEGATIVE NEGATIVE Final   Influenza B by PCR NEGATIVE NEGATIVE Final    Comment: (NOTE) The Xpert Xpress SARS-CoV-2/FLU/RSV plus assay is intended as an aid in the diagnosis of influenza from Nasopharyngeal swab specimens and should not be used as a sole basis for treatment. Nasal washings and aspirates are unacceptable for Xpert Xpress SARS-CoV-2/FLU/RSV testing.  Fact Sheet for Patients: EntrepreneurPulse.com.au  Fact Sheet for Healthcare Providers: IncredibleEmployment.be  This test is not yet approved or cleared by the [place] FDA and has been authorized for detection and/or diagnosis of SARS-CoV-2 by FDA under an Emergency Use Authorization (EUA). This EUA will remain in effect (meaning this test can be used) for the duration of the COVID-19 declaration under Section 564(b)(1) of the Act, 21 U.S.C. section 360bbb-3(b)(1), unless the authorization is terminated or revoked.  Performed at [facility], [address], [place], [place] [address]   MRSA Next Gen by PCR, Nasal     Status: None   Collection Time: 03/26/22  2:10 PM   Specimen: Nasal Mucosa; Nasal Swab  Result Value Ref Range Status   MRSA by PCR Next Gen NOT DETECTED NOT DETECTED Final    Comment: (NOTE) The GeneXpert MRSA Assay (FDA approved for NASAL specimens only), is one component of a comprehensive MRSA colonization surveillance program. It is not intended to diagnose MRSA infection nor to guide or monitor treatment for MRSA infections. Test performance is not FDA approved in patients less than 10 years old. Performed at [facility], [address] [place], [place] [address]      Scheduled Meds:  amitriptyline  50 mg Oral QHS   budesonide (PULMICORT) nebulizer solution  0.5 mg Nebulization BID   Chlorhexidine Gluconate Cloth  6 each Topical Daily   DULoxetine  60 mg Oral  Daily   folic acid  2 mg Oral Daily   furosemide  40 mg Intravenous Once   gabapentin  1,200 mg Oral QHS   gabapentin  600 mg Oral Q lunch   gabapentin  900 mg Oral q AM  insulin aspart  0-9 Units Subcutaneous TID WC   ipratropium-albuterol  3 mL Nebulization Q6H   linaclotide  145 mcg Oral QAC breakfast   pantoprazole  40 mg Oral Daily   [START ON 03/29/2022] predniSONE  20 mg Oral Q breakfast   rosuvastatin  10 mg Oral QHS   sulfamethoxazole-trimethoprim  1 tablet Oral Once per day on Mon Wed Fri   vitamin B-12  1,000 mcg Oral Daily   Continuous Infusions:  heparin 900 Units/hr (03/28/22 1116)   meropenem (MERREM) IV 1 g (03/28/22 0559)   vancomycin 1,750 mg (03/27/22 2301)    Procedures/Studies: CT Head Wo Contrast  Result Date: 03/26/2022 CLINICAL DATA:  Altered mental status.  Recent stroke. EXAM: CT HEAD WITHOUT CONTRAST TECHNIQUE: Contiguous axial images were obtained from the base of the skull through the vertex without intravenous contrast. RADIATION DOSE REDUCTION: This exam was performed according to the departmental dose-optimization program which includes automated exposure control, adjustment of the mA and/or kV according to patient size and/or use of iterative reconstruction technique. COMPARISON:  11/01/2020 FINDINGS: Brain: No evidence of intracranial hemorrhage, acute infarction, hydrocephalus, extra-axial collection, or mass lesion/mass effect. 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 Vascular:  No hyperdense vessel or other acute findings. Skull: No evidence of fracture or other significant bone abnormality. Sinuses/Orbits:  No acute findings. Other: None. IMPRESSION: No acute intracranial abnormality. Electronically Signed   By: Marlaine Hind M.D.   On: 03/26/2022 11:42   CT Angio Chest Pulmonary Embolism (PE) W or WO Contrast  Result Date: 03/27/2022 CLINICAL DATA:  Chest pain and shortness of  breath for several weeks, recent pneumonia EXAM: CT ANGIOGRAPHY CHEST WITH CONTRAST TECHNIQUE: Multidetector CT imaging of the chest was performed using the standard protocol during bolus administration of intravenous contrast. Multiplanar CT image reconstructions and MIPs were obtained to evaluate the vascular anatomy. RADIATION DOSE REDUCTION: This exam was performed according to the departmental dose-optimization program which includes automated exposure control, adjustment of the mA and/or kV according to patient size and/or use of iterative reconstruction technique. CONTRAST:  56mL OMNIPAQUE IOHEXOL 300 MG/ML  SOLN IV COMPARISON:  CT chest 01/04/2022 FINDINGS: Cardiovascular: Aorta normal caliber without aneurysm or dissection. Proximal great vessels unremarkable. Heart normal appearance. No pericardial effusion. Pulmonary arteries adequately opacified. Two small filling defects are identified consistent with pulmonary embolism, in RIGHT lower lobe image 214 and in RIGHT middle lobe image 160. No additional emboli. Mediastinum/Nodes: Esophagus unremarkable. Few normal sized mediastinal lymph nodes. No thoracic adenopathy. Base of cervical region normal appearance. Lungs/Pleura: Small BILATERAL pleural effusions. BILATERAL pulmonary infiltrates, greatest in RIGHT upper lobe. No pneumothorax or mass. Upper Abdomen: Gallbladder surgically absent. Splenic deformity question related to prior infarct or surgery. Musculoskeletal: No acute osseous findings. Review of the MIP images confirms the above findings. IMPRESSION: Small pulmonary emboli in RIGHT lung. Small BILATERAL pleural effusions. Patchy BILATERAL pulmonary infiltrates consistent with multifocal pneumonia greatest in RIGHT upper lobe. Critical Value/emergent results were called by telephone at the time of interpretation on 03/27/2022 at 1510 hrs to provider Yaviel Kloster MD, who verbally acknowledged these results. Electronically Signed   By: Lavonia Dana M.D.    On: 03/27/2022 15:13   DG Chest Port 1 View  Result Date: 03/26/2022 CLINICAL DATA:  Sepsis. EXAM: PORTABLE CHEST 1 VIEW COMPARISON:  February 19, 2022 FINDINGS: There is infiltrate in the right mid lung. There may be infiltrate in the medial left upper lung.  No pneumothorax. No other acute interval changes. The study is limited however due to portable technique. IMPRESSION: The study is limited due to low volume portable technique. However, there is an infiltrate in the right mid lung consistent with for pneumonia given history. There may be a more subtle smaller infiltrate in the medial left upper lung. Electronically Signed   By: Dorise Bullion III M.D.   On: 03/26/2022 10:22   ECHOCARDIOGRAM COMPLETE  Result Date: 03/28/2022    ECHOCARDIOGRAM REPORT   Patient Name:   Muskan Peckenpaugh Date of Exam: 03/28/2022 Medical Rec #:  [id number]   Height:       64.0 in Accession #:    [id number]  Weight:       211.4 lb Date of Birth:  1965-11-12   BSA:          2.003 m Patient Age:    72 years    BP:           121/84 mmHg Patient Gender: F           HR:           86 bpm. Exam Location:  [facility] Procedure: 2D Echo, Cardiac Doppler and Color Doppler Indications:    Pulmonary embolus  History:        Patient has no prior history of Echocardiogram examinations.                 Risk Factors:Hypertension and Dyslipidemia.  Sonographer:    Wenda Low Referring Phys: [phone] Joreen Swearingin IMPRESSIONS  1. Left ventricular ejection fraction, by estimation, is 55%. The left ventricle has normal function. The left ventricle has no regional wall motion abnormalities. Left ventricular diastolic parameters were normal.  2. Right ventricular systolic function is normal. The right ventricular size is normal. There is normal pulmonary artery systolic pressure.  3. The pericardial effusion is anterior to the right ventricle.  4. The mitral valve is abnormal. Trivial mitral valve regurgitation. No evidence of mitral stenosis.  5. The aortic  valve is tricuspid. There is mild calcification of the aortic valve. Aortic valve regurgitation is not visualized. Aortic valve sclerosis is present, with no evidence of aortic valve stenosis.  6. The inferior vena cava is normal in size with greater than 50% respiratory variability, suggesting right atrial pressure of 3 mmHg. FINDINGS  Left Ventricle: Left ventricular ejection fraction, by estimation, is 55%. The left ventricle has normal function. The left ventricle has no regional wall motion abnormalities. The left ventricular internal cavity size was normal in size. There is no left ventricular hypertrophy. Left ventricular diastolic parameters were normal. Right Ventricle: The right ventricular size is normal. No increase in right ventricular wall thickness. Right ventricular systolic function is normal. There is normal pulmonary artery systolic pressure. The tricuspid regurgitant velocity is 1.76 m/s, and  with an assumed right atrial pressure of 3 mmHg, the estimated right ventricular systolic pressure is 123XX123 mmHg. Left Atrium: Left atrial size was normal in size. Right Atrium: Right atrial size was normal in size. Pericardium: Trivial pericardial effusion is present. The pericardial effusion is anterior to the right ventricle. Mitral Valve: The mitral valve is abnormal. There is mild thickening of the mitral valve leaflet(s). There is mild calcification of the mitral valve leaflet(s). Trivial mitral valve regurgitation. No evidence of mitral valve stenosis. MV peak gradient, 6.5 mmHg. The mean mitral valve gradient is 3.0 mmHg. Tricuspid Valve: The tricuspid valve is normal in structure. Tricuspid valve regurgitation is  trivial. No evidence of tricuspid stenosis. Aortic Valve: The aortic valve is tricuspid. There is mild calcification of the aortic valve. Aortic valve regurgitation is not visualized. Aortic valve sclerosis is present, with no evidence of aortic valve stenosis. Aortic valve mean gradient  measures 5.0 mmHg. Aortic valve peak gradient measures 10.8 mmHg. Aortic valve area, by VTI measures 2.22 cm. Pulmonic Valve: The pulmonic valve was normal in structure. Pulmonic valve regurgitation is not visualized. No evidence of pulmonic stenosis. Aorta: The aortic root is normal in size and structure. Venous: The inferior vena cava is normal in size with greater than 50% respiratory variability, suggesting right atrial pressure of 3 mmHg. IAS/Shunts: No atrial level shunt detected by color flow Doppler.  LEFT VENTRICLE PLAX 2D LVIDd:         4.90 cm     Diastology LVIDs:         3.50 cm     LV e' medial:    7.72 cm/s LV PW:         1.00 cm     LV E/e' medial:  15.2 LV IVS:        0.90 cm     LV e' lateral:   14.60 cm/s LVOT diam:     2.00 cm     LV E/e' lateral: 8.0 LV SV:         76 LV SV Index:   38 LVOT Area:     3.14 cm  LV Volumes (MOD) LV vol d, MOD A2C: 60.2 ml LV vol d, MOD A4C: 49.4 ml LV vol s, MOD A2C: 24.4 ml LV vol s, MOD A4C: 23.8 ml LV SV MOD A2C:     35.8 ml LV SV MOD A4C:     49.4 ml LV SV MOD BP:      30.8 ml RIGHT VENTRICLE RV Basal diam:  3.15 cm RV Mid diam:    2.80 cm RV S prime:     13.40 cm/s TAPSE (M-mode): 2.3 cm LEFT ATRIUM             Index        RIGHT ATRIUM           Index LA diam:        3.70 cm 1.85 cm/m   RA Area:     15.50 cm LA Vol (A2C):   46.0 ml 22.96 ml/m  RA Volume:   37.70 ml  18.82 ml/m LA Vol (A4C):   50.9 ml 25.41 ml/m LA Biplane Vol: 50.3 ml 25.11 ml/m  AORTIC VALVE                     PULMONIC VALVE AV Area (Vmax):    2.38 cm      PV Vmax:       0.90 m/s AV Area (Vmean):   2.30 cm      PV Peak grad:  3.2 mmHg AV Area (VTI):     2.22 cm AV Vmax:           164.00 cm/s AV Vmean:          107.000 cm/s AV VTI:            0.344 m AV Peak Grad:      10.8 mmHg AV Mean Grad:      5.0 mmHg LVOT Vmax:         124.00 cm/s LVOT Vmean:        78.500 cm/s LVOT VTI:  0.243 m LVOT/AV VTI ratio: 0.71  AORTA Ao Root diam: 2.90 cm MITRAL VALVE                 TRICUSPID VALVE MV Area (PHT): 3.68 cm     TR Peak grad:   12.4 mmHg MV Area VTI:   2.13 cm     TR Vmax:        176.00 cm/s MV Peak grad:  6.5 mmHg MV Mean grad:  3.0 mmHg     SHUNTS MV Vmax:       1.27 m/s     Systemic VTI:  0.24 m MV Vmean:      77.6 cm/s    Systemic Diam: 2.00 cm MV Decel Time: 206 msec MV E velocity: 117.00 cm/s MV A velocity: 89.50 cm/s MV E/A ratio:  1.31 Jenkins Rouge MD Electronically signed by Jenkins Rouge MD Signature Date/Time: 03/28/2022/12:20:15 PM    Final     Orson Eva, DO  [facility]  If 7PM-7AM, please contact night-coverage [email or web address] Password [email or web address] 03/28/2022, 1:56 PM   LOS: 2 days

## 2022-03-28 NOTE — Assessment & Plan Note (Signed)
6/5 CTA chest--Small pulmonary emboli in RIGHT lung --started IV heparin --continues to have small volume hemoptysis --Echo EF 55%, no RV strain --requesting pulmonary consult

## 2022-03-28 NOTE — Progress Notes (Signed)
Called to pt's room by staff, arrived to find RT in room administering nebulizer treatment and obtaining EKG. Staff in room state pt got up to go to bathroom and became extremely SOB with exertion, continues to c/o chest pain. Pt also coughed up what looks like a marble sized blood clot. MD Tat notified of above, orders received. Chest remains clear to CTA, pt currently able to carry on conversation with staff members in the room. States breathing is better since neb tx finished. Pt currently on O2 @ 3 lpm [place] per RT, current SaO2 98%, RR 24/min. Other VS stable.

## 2022-03-28 NOTE — Progress Notes (Addendum)
ANTICOAGULATION CONSULT NOTE  Pharmacy Consult for heparin Indication: pulmonary embolus  Allergies  Allergen Reactions   Ibuprofen Other (See Comments)    Pt is unable to take this due to kidney problems.      Penicillins Rash and Other (See Comments)    Has patient had a PCN reaction causing immediate rash, facial/tongue/throat swelling, SOB or lightheadedness with hypotension: No Has patient had a PCN reaction causing severe rash involving mucus membranes or skin necrosis: No Has patient had a PCN reaction that required hospitalization No Has patient had a PCN reaction occurring within the last 10 years: No If all of the above answers are "NO", then may proceed with Cephalosporin use.    Patient Measurements: Height: 5\' 4"  (162.6 cm) Weight: 95.9 kg (211 lb 6.7 oz) IBW/kg (Calculated) : 54.7 Heparin Dosing Weight: 76 kg  Vital Signs: Temp: 98.6 F (37 C) (06/06 0755) Temp Source: Oral (06/06 0755) BP: 121/84 (06/06 0407)  Labs: Recent Labs    03/26/22 1007 03/27/22 0535 03/27/22 2222 03/28/22 0402 03/28/22 0753  HGB 9.8* 8.5*  --  7.8*  --   HCT 33.4* 29.4*  --  26.6*  --   PLT 247 210  --  228  --   APTT 28  --   --   --   --   LABPROT 14.1  --   --   --   --   INR 1.1  --   --   --   --   HEPARINUNFRC  --   --  >1.10*  --  0.94*  CREATININE 1.11* 0.86  --  0.69  --      Estimated Creatinine Clearance: 88.3 mL/min (by C-G formula based on SCr of 0.69 mg/dL).   Medical History: Past Medical History:  Diagnosis Date   Chronic cough    Chronic pain    Diabetes mellitus without complication (HCC)    Dyspnea    GERD (gastroesophageal reflux disease)    Hypokalemia    Hypothyroidism    Left wrist pain 06/01/2021   Myonecrosis (HCC) 07/01/2021   Neurocardiogenic syncope    OSA (obstructive sleep apnea)    does not use CPAP   Osteomyelitis (HCC)    bilateral feet   Peripheral neuropathy    Presence of intrathecal pump    recieves Prialt/bupivicaine    Tear of gluteus medius tendon 07/01/2021   Wegener's granulomatosis 2009   Wegner's disease (congenital syphilitic osteochondritis)     Medications:  Medications Prior to Admission  Medication Sig Dispense Refill Last Dose   acetaminophen (TYLENOL) 325 MG tablet Take 2 tablets (650 mg total) by mouth every 6 (six) hours as needed for mild pain (or Fever >/= 101).   unknown   albuterol (PROVENTIL HFA;VENTOLIN HFA) 108 (90 Base) MCG/ACT inhaler Inhale 2 puffs into the lungs every 6 (six) hours as needed for wheezing or shortness of breath.   unknown   amitriptyline (ELAVIL) 50 MG tablet Take 50 mg by mouth at bedtime.   03/25/2022   Calcium Carb-Cholecalciferol (CALCIUM CARBONATE-VITAMIN D3 PO) Take 1 tablet by mouth daily.   unknown   DULoxetine (CYMBALTA) 60 MG capsule Take 60 mg by mouth daily.   03/26/2022   folic acid (FOLVITE) 1 MG tablet Take 2 mg by mouth daily.   unknown   gabapentin (NEURONTIN) 300 MG capsule Take 600-1,200 mg by mouth See admin instructions. Take 3 (900 mg total) capsules by mouth in the morning, 2 (600 mg) capsule  in the afternoon and 4 (1200 mg) capsules in the evening.   03/26/2022   lidocaine (LIDODERM) 5 % Place 1 patch onto the skin daily as needed (for pain). Remove & Discard patch within 12 hours or as directed by MD   03/25/2022   linaclotide (LINZESS) 145 MCG CAPS capsule Take 145 mcg by mouth daily before breakfast.   03/26/2022   Melatonin 10 MG TABS Take 10 mg by mouth at bedtime as needed (sleep).   03/25/2022   metFORMIN (GLUCOPHAGE-XR) 500 MG 24 hr tablet Take 500 mg by mouth daily with breakfast.   03/26/2022   methocarbamol (ROBAXIN) 500 MG tablet Take 500 mg by mouth every 6 (six) hours as needed for muscle spasms.   03/25/2022   METHOTREXATE SODIUM IJ Inject 25 mg into the skin every Sunday.   03/19/2022   naloxone (NARCAN) nasal spray 4 mg/0.1 mL Place 0.4 mg into the nose once.   unknown   nystatin (MYCOSTATIN) 100000 UNIT/ML suspension Use as directed 5 mLs in the  mouth or throat 4 (four) times daily.   Past Week   pantoprazole (PROTONIX) 40 MG tablet Take 40 mg by mouth daily.   03/26/2022   Potassium Chloride Crys ER (K-DUR PO) Take 40 mEq by mouth every other day.   unknown   predniSONE (DELTASONE) 10 MG tablet Take 4 tabs (40mg ) daily for 2 days, then reduce to your home dose of 5mg  daily (Patient taking differently: Take 5 mg by mouth daily with breakfast.) 30 tablet 0 03/26/2022   rosuvastatin (CRESTOR) 10 MG tablet Take 10 mg by mouth at bedtime.   03/25/2022   sulfamethoxazole-trimethoprim (BACTRIM) 400-80 MG tablet Take 1 tablet by mouth 3 (three) times a week. Monday,Wednesday and Friday   03/24/2022   tapentadol HCl (NUCYNTA) 75 MG tablet Take 75 mg by mouth. Take 1 tablet in the morning, 1 tablet in the afternoon and 2 tablets at bedtime   03/26/2022   topiramate (TOPAMAX) 100 MG tablet Take 100 mg by mouth daily.   03/26/2022   vitamin B-12 (CYANOCOBALAMIN) 1000 MCG tablet Take 1,000 mcg by mouth daily.   unknown   Vitamin D, Ergocalciferol, (DRISDOL) 50000 units CAPS capsule Take 50,000 Units by mouth every Friday.   03/24/2022   oxyCODONE 10 MG TABS Take 1 tablet (10 mg total) by mouth every 4 (four) hours as needed for severe pain or breakthrough pain. (Patient not taking: Reported on 03/26/2022) 30 tablet 0 Not Taking   senna (SENOKOT) 8.6 MG TABS tablet Take 2 tablets (17.2 mg total) by mouth 2 (two) times daily. (Patient not taking: Reported on 03/26/2022) 30 tablet 0 Not Taking    Assessment: Pharmacy consulted to dose heparin in patient with pulmonary embolism, confirmed by CT.  Patient is not on anticoagulation prior to admission but received prophylaxis lovenox 6/5 1419.  HL 0.94- supratherapeutic  Hgb 19.8 >8.5  > 7.8  Goal of Therapy:  Heparin level 0.3-0.7 units/ml Monitor platelets by anticoagulation protocol: Yes   Plan:  Decrease heparin infusion to 900 units/hr. Check anti-Xa level in 6 hours and daily Continue to monitor H&H and  platelets.   05/26/2022, PharmD Clinical Pharmacist 03/28/2022 8:56 AM

## 2022-03-28 NOTE — Consult Note (Signed)
[facility]   Patient name: [name] Admit date: 03/26/2022  DOB: [birth year] LOS: 2  MRN: [id number] Consult date: 03/28/2022  Referring provider: Dr. Carles Collet, [facility] CC: Hemoptysis    History:  56 yo female was brought to [facility] ER with AMS.  SpO2 in 70's on room air.  She was noted to have fever 104F, and hypotensive.  She was started on antibiotics for pneumonia and UTI.  CT chest showed small PEs.  She was started on heparin infusion.  She had persistent hemoptysis.  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  She had sputum culture from 02/21/22 that grew MRSA.    He had recent hospitalization for myositis (02/19/22 to 02/23/22) and treated with clindamycin and steroid taper.    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  She is followed by Dr. Valentino Nose with rheumatology and Dr. Dionne Milo with pulmonary.  Past medical history:  Chronic pain, DM type 2, GERD, Hypothyroidism, OSA, Osteomyelitis, Neuropathy, Steroid induced adrenal insufficiency, COVID 19 infection January 2022  Significant events:  6/04 Admit, start ABx 6/05 start heparin gtt  Studies:  CT angio chest 03/27/22 >> 2 small filling defects in RLL and RML, small b/l effusions, b/l infiltrates Echo 03/28/22 >> EF 55%   Micro:  COVID/Flu 6/04 >> negative Blood 6/04 >> Urine 6/04 >> E coli   Lines:     Antibiotics:  Cefepime 6/04  Meropenem 6/04 >> Vancomycin 6/04 >>   Consults:      Interim history:  She was diagnosed with Wegener's about 15 yrs ago.  She was having recurrent sinus and lower respiratory infections that wouldn't respond to therapy.  She ultimately was seen by ENT and had nasal biopsy showing vasculitis.  She has been seen previously at [facility], [facility] clinic and [place].  More recently has been followed by specialists in [place].  She had pneumonia in December.   She gets better with therapy in the hospital with antibiotics.  About 2 weeks after being discharged she gets recurrent symptoms of pneumonia.  She also gets episodes of lethargy, and this is associated with headache and twitching.  Vital signs:  BP (!) 157/98 (BP Location: Left Arm)   Pulse 91   Temp 99.4 F (37.4 C)   Resp (!) 24   Ht _0  (1.626 m)   Wt 95.9 kg   SpO2 99%   BMI 36.29 kg/m   Intake/output:  I/O last 3 completed shifts: In: 3395.4 [I.V.:2595.4; IV Piggyback:800] Out: 2428 [Urine:2427; Stool:1]   Physical exam:   General - alert Eyes - pupils reactive ENT - no sinus tenderness, no stridor Cardiac - regular rate/rhythm, no murmur Chest - equal breath sounds b/l, no wheezing or rales Abdomen - soft, non tender, + bowel sounds Extremities - no cyanosis, clubbing, or edema, b/l TMA Skin - no rashes Neuro - normal strength, moves extremities, follows commands Psych - normal mood and behavior   Best practice:   DVT - heparin gtt SUP - protonix Nutrition - carb modified    Discussion:  She has 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  She has recurrent episodes of pneumonia since December 2022.  She had bronchoscopy in March 2023 with sputum AFB growing Mycobacterium gordonae.  It is uncertain whether this is causing infection that gets partially treated with course of antibiotics while in  hospital, or colonization.  She also had MRSA in sputum culture from May 2023.  She also has sleep disordered breathing and is on chronic opiate medication.  She has episodes of lethargy with possible myoclonus which could be seen with hypercapnia.    Assessment/plan:   Recurrent pneumonia. - continue current antibiotics - she would benefit from consultation with infectious disease  - she might need repeat bronchoscopy for BAL to repeat AFB culture  Pulmonary embolism. - these are  relatively small and not likely contributing significantly to respiratory symptoms - continue heparin gtt for now  Sleep disordered breathing. - will get ABG on room air in the AM of 03/29/22  Granulomatosis with polyangiitis. - continue prednisone, bactrim - methotrexate and rituximab on hold - she would like to set up new rheumatology and pulmonary team after hospital discharge  Chronic pain with chronic opiate medication use. CKD 3a. Chemotherapy induced neuropathy. - per primary team  D/w Dr. Carles Collet.  Will likely need to transfer to [place] for formal infectious disease consult and then arrange for bronchoscopy if needed.  Resolved hospital problems:    Goals of care/Family discussions:  Code status: full  Update her husband by phone.  Labs:      Latest Ref Rng & Units 03/28/2022    4:02 AM 03/27/2022    5:35 AM 03/26/2022   10:07 AM  CMP  Glucose 70 - 99 mg/dL 148   118   140    BUN 6 - 20 mg/dL _0 Creatinine 0.44 - 1.00 mg/dL 0.69   0.86   1.11    Sodium 135 - 145 mmol/L 141   143   141    Potassium 3.5 - 5.1 mmol/L 3.1   3.8   3.6    Chloride 98 - 111 mmol/L 121   122   114    CO2 22 - 32 mmol/L _1 Calcium 8.9 - 10.3 mg/dL 7.3   7.2   7.9    Total Protein 6.5 - 8.1 g/dL  4.7   5.1    Total Bilirubin 0.3 - 1.2 mg/dL  0.6   0.4    Alkaline Phos 38 - 126 U/L  67   87    AST 15 - 41 U/L  23   27    ALT 0 - 44 U/L  17   18         Latest Ref Rng & Units 03/28/2022    4:02 AM 03/27/2022    5:35 AM 03/26/2022   10:07 AM  CBC  WBC 4.0 - 10.5 K/uL 13.8   17.0   13.3    Hemoglobin 12.0 - 15.0 g/dL 7.8   8.5   9.8    Hematocrit 36.0 - 46.0 % 26.6   29.4   33.4    Platelets 150 - 400 K/uL 228   210   247      ABG    Component Value Date/Time   HCO3 21.6 03/26/2022 1027   ACIDBASEDEF 4.8 (H) 03/26/2022 1027   O2SAT 68.5 03/26/2022 1027    CBG (last 3)  Recent Labs    03/27/22 2104 03/28/22 0722 03/28/22 1102  GLUCAP 197* 114* 94      Past surgical history:  She  has a past surgical history that includes Cholecystectomy; Appendectomy; Vaginal hysterectomy; splenic aneurysm; Hernia repair (2000); Septoplasty (02/2013); Colonoscopy (N/A, 06/02/2013);  Back surgery; spleen anuyism; Incision and drainage abscess (Left, 07/07/2016); Amputation toe (Bilateral, 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  Social history:  She  reports that she has never smoked. She has never used smokeless tobacco. She reports current alcohol use. She reports that she does not use drugs.   Review of systems:  Reviewed and negative  Family history:  Her family history includes Diabetes in her father and mother; Heart disease in her mother.    Medications:   No current facility-administered medications on file prior to encounter.   Current Outpatient Medications on File Prior to Encounter  Medication Sig   acetaminophen (TYLENOL) 325 MG tablet Take 2 tablets (650 mg total) by mouth every 6 (six) hours as needed for mild pain (or Fever >/= 101).   albuterol (PROVENTIL HFA;VENTOLIN HFA) 108 (90 Base) MCG/ACT inhaler Inhale 2 puffs into the lungs every 6 (six) hours as needed for wheezing or shortness of breath.   amitriptyline (ELAVIL) 50 MG tablet Take 50 mg by mouth at bedtime.   Calcium Carb-Cholecalciferol (CALCIUM CARBONATE-VITAMIN D3 PO) Take 1 tablet by mouth daily.   DULoxetine (CYMBALTA) 60 MG capsule Take 60 mg by mouth daily.   folic acid (FOLVITE) 1 MG tablet Take 2 mg by mouth daily.   gabapentin (NEURONTIN) 300 MG capsule Take 600-1,200 mg by mouth See admin instructions. Take 3 (900 mg total) capsules by mouth in the morning, 2 (600 mg) capsule in the afternoon and 4 (1200 mg) capsules in the evening.   lidocaine (LIDODERM) 5 % Place 1 patch onto the skin daily as needed (for pain). Remove &  Discard patch within 12 hours or as directed by MD   linaclotide (LINZESS) 145 MCG CAPS capsule Take 145 mcg by mouth daily before breakfast.   Melatonin 10 MG TABS Take 10 mg by mouth at bedtime as needed (sleep).   metFORMIN (GLUCOPHAGE-XR) 500 MG 24 hr tablet Take 500 mg by mouth daily with breakfast.   methocarbamol (ROBAXIN) 500 MG tablet Take 500 mg by mouth every 6 (six) hours as needed for muscle spasms.   METHOTREXATE SODIUM IJ Inject 25 mg into the skin every Sunday.   naloxone (NARCAN) nasal spray 4 mg/0.1 mL Place 0.4 mg into the nose once.   nystatin (MYCOSTATIN) 100000 UNIT/ML suspension Use as directed 5 mLs in the mouth or throat 4 (four) times daily.   pantoprazole (PROTONIX) 40 MG tablet Take 40 mg by mouth daily.   Potassium Chloride Crys ER (K-DUR PO) Take 40 mEq by mouth every other day.   predniSONE (DELTASONE) 10 MG tablet Take 4 tabs (65m) daily for 2 days, then reduce to your home dose of 564mdaily (Patient taking differently: Take 5 mg by mouth daily with breakfast.)   rosuvastatin (CRESTOR) 10 MG tablet Take 10 mg by mouth at bedtime.   sulfamethoxazole-trimethoprim (BACTRIM) 400-80 MG tablet Take 1 tablet by mouth 3 (three) times a week. Monday,Wednesday and Friday   tapentadol HCl (NUCYNTA) 75 MG tablet Take 75 mg by mouth. Take 1 tablet in the morning, 1 tablet in the afternoon and 2 tablets at bedtime   topiramate (TOPAMAX) 100 MG tablet Take 100 mg by mouth daily.   vitamin B-12 (CYANOCOBALAMIN) 1000 MCG tablet Take 1,000 mcg by mouth daily.   Vitamin D, Ergocalciferol, (DRISDOL) 50000 units CAPS capsule Take 50,000 Units by mouth every Friday.   oxyCODONE 10 MG TABS Take 1 tablet (  10 mg total) by mouth every 4 (four) hours as needed for severe pain or breakthrough pain. (Patient not taking: Reported on 03/26/2022)   senna (SENOKOT) 8.6 MG TABS tablet Take 2 tablets (17.2 mg total) by mouth 2 (two) times daily. (Patient not taking: Reported on 03/26/2022)      Signature:  Chesley Mires, MD [place] Pager - [phone] 03/28/2022, 5:38 PM

## 2022-03-28 NOTE — Progress Notes (Signed)
Pt called out for assistance, states increased SOB. Arrived to room to find pt talking on phone with family member. Pt states feels very SOB, but not any worse than prior to transfer from ICU. Able to carry on full conversation. Skin warm and dry, color pale. Chest clear to auscultation, HR regular. VSS. SaO2 100% on room air. Pt very anxious, states she doesn't feel that she should have been moved out of ICU today. Pt then states that she has  chest pain, describes as stabbing, radiates through to back, worse with deep breath, movement and cough.  States has had same pain off and on since prior to admission. MD Tat notified via AMION of current pt condition and complaint.

## 2022-03-28 NOTE — Evaluation (Signed)
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  The patient is unable to provide any history at this time secondary to her encephalopathy.  History is obtained from review of the medical record and speaking with the patient's spouse at the bedside.  According to the patient's spouse, the patient normally has episodes of "on and off confusion" for at least the past year.  He states that the patient was on a day trip with her son to [facility] on 03/25/2022.  Apparently, she had an episode of poor responsiveness after which she was back to her usual baseline.  He states that this has been normal for her for many months.  However, he has noted at the same time that she has been a bit more confused in the past week.  However when she woke up on the morning of 03/26/2022 the patient was more confused and agitated than usual.  The spouse took the patient's temperature with a transcutaneous thermometer, and it was 104.0 F at home.  He states that she has not been on any new medications.  There is been no history of vomiting, diarrhea, worsening shortness of breath.  However she has a chronic cough.  She did have a small amount of hemoptysis on 03/22/2022.  The patient has chronic pain and has an intrathecal pump infusing bupivacaine/ziconitide which was recently refilled on 03/02/2022 at [facility] pain clinic.  He states that the settings have not been changed.  He also states that the patient does not take more opioid medications than prescribed.  There is been no history of headaches, tonic-clonic activity, or syncope.  Notably, the patient was recently  hospitalized from 02/19/2022 to 02/23/2022 secondary to myositis of the gluteus medius.  She was discharged home with clindamycin and a steroid taper.  Spouse states that her hip pain has gradually improved since discharge from the hospital, and she is able to ambulate.   Clinical Impression  Patient is modified independent with bed mobility demonstrating mild labored movement along with increased time needed to complete task. Demonstrates good trunk control with scooting to EOB. Patient is supervision assist with transfers with no AD utilized with appropriate strength observed. Patient does demonstrate mild balance deficits impacting transfer ability. Patient was able to ambulate in the hallway for 45 feet with min guard/assist along with HHA provided by PT for stability and balance. Patient demonstrates decreased gait speed primarily due to secondary musculoskeletal limitations of B feet impacting balancing ability with weight shifting during ambulation.  Patient had mild gait deviations but demonstrated appropriate strength and coordination during gait. Patient was primarily limited due to SOB and fatigue. Vitals were assessed after gait assessment in which SpO2 was recorded to be at 97% while patient sitting up in chair. Nursing staff notified of mobility status. Patient will benefit from continued skilled physical therapy in hospital and recommended venue below to increase strength, balance, endurance for safe ADLs and gait.      Recommendations for follow up therapy are one component of a multi-disciplinary discharge planning process, led by the attending physician.  Recommendations may be updated based on patient status, additional functional criteria and insurance authorization.  Follow Up Recommendations Home health PT    Assistance Recommended  at Discharge PRN  Patient can return home with the following  A little help with walking and/or transfers;Help with stairs or ramp for entrance;A little  help with bathing/dressing/bathroom;Assist for transportation    Equipment Recommendations None recommended by PT  Recommendations for Other Services       Functional Status Assessment Patient has had a recent decline in their functional status and demonstrates the ability to make significant improvements in function in a reasonable and predictable amount of time.     Precautions / Restrictions Precautions Precautions: Fall Restrictions Weight Bearing Restrictions: No      Mobility  Bed Mobility Overal bed mobility: Modified Independent             General bed mobility comments: Patient able to perform supine to sit with modified independence with mild labored movement and extended time needed.    Transfers Overall transfer level: Needs assistance Equipment used: None Transfers: Sit to/from Stand Sit to Stand: Supervision           General transfer comment: Patient was able to perform sit to stand with supervision assist and no AD needed. Patient performed transfer with appropriate strength with mild balance deficits observed    Ambulation/Gait Ambulation/Gait assistance: Min guard, Min assist Gait Distance (Feet): 45 Feet Assistive device: 1 person hand held assist Gait Pattern/deviations: Step-to pattern, Decreased step length - left, Decreased step length - right, Decreased stride length Gait velocity: decreased     General Gait Details: Patient was able to ambulate for 56 feet with min guard/assist with HHA provided by PT for stability and balancewith. Patient demonstrates a slow gait pattern due to secondary musculoskeletal limitations of the feet but gait pattern is generally WNL with appropriate strength and coordination observed. Patient was primiarly limited by SOB and fatigue. Vitals assessed after.  Stairs            Wheelchair Mobility    Modified Rankin (Stroke Patients Only)       Balance Overall balance assessment: Needs  assistance Sitting-balance support: Bilateral upper extremity supported, Feet supported Sitting balance-Leahy Scale: Good Sitting balance - Comments: Patient able to sit EOB with UE support   Standing balance support: Bilateral upper extremity supported, During functional activity Standing balance-Leahy Scale: Fair Standing balance comment: Patient able to balance with no AD but demonstrates minor balance deficits secondary to B midfoot amputation.                             Pertinent Vitals/Pain Pain Assessment Pain Assessment: 0-10 Pain Score: 7  Pain Location: B feet, back of R knee Pain Descriptors / Indicators: Shooting, Burning Pain Intervention(s): Limited activity within patient's tolerance, Monitored during session, Repositioned    Home Living Family/patient expects to be discharged to:: Private residence Living Arrangements: Spouse/significant other Available Help at Discharge: Family;Available PRN/intermittently Type of Home: House Home Access: Stairs to enter Entrance Stairs-Rails: None Entrance Stairs-Number of Steps: 2   Home Layout: One level Home Equipment: Engineer, maintenance (IT) (2 wheels);BSC/3in1;Cane - single point;Tub bench;Grab bars - tub/shower      Prior Function Prior Level of Function : Needs assist       Physical Assist : Mobility (physical);ADLs (physical) Mobility (physical): Transfers;Gait;Stairs;Bed mobility ADLs (physical): Dressing;Toileting;IADLs;Bathing Mobility Comments: Patient is able to ambulate short distances at home with use of RW and cane but primarily uses scooter for longer distances and is the main AD used. Patient is a limited community ambulator  with use of scooter. Patient is not driving. ADLs Comments: Assisted for bathing and dressing. Pt able to complete toileting without assist. Grooming and feeding are independent. Assisted for IADL's by husband.     Hand Dominance   Dominant Hand: Left     Extremity/Trunk Assessment   Upper Extremity Assessment Upper Extremity Assessment: Overall WFL for tasks assessed    Lower Extremity Assessment Lower Extremity Assessment: Overall WFL for tasks assessed    Cervical / Trunk Assessment Cervical / Trunk Assessment: Normal  Communication   Communication: No difficulties  Cognition Arousal/Alertness: Awake/alert Behavior During Therapy: WFL for tasks assessed/performed Overall Cognitive Status: Within Functional Limits for tasks assessed                                          General Comments      Exercises     Assessment/Plan    PT Assessment Patient needs continued PT services;All further PT needs can be met in the next venue of care  PT Problem List Decreased strength;Decreased activity tolerance;Decreased balance;Decreased mobility;Decreased coordination       PT Treatment Interventions DME instruction;Gait training;Functional mobility training;Therapeutic activities;Therapeutic exercise;Balance training    PT Goals (Current goals can be found in the Care Plan section)  Acute Rehab PT Goals Patient Stated Goal: return home PT Goal Formulation: With patient Time For Goal Achievement: 04/04/22 Potential to Achieve Goals: Good    Frequency Min 3X/week     Co-evaluation               AM-PAC PT "6 Clicks" Mobility  Outcome Measure Help needed turning from your back to your side while in a flat bed without using bedrails?: None Help needed moving from lying on your back to sitting on the side of a flat bed without using bedrails?: None Help needed moving to and from a bed to a chair (including a wheelchair)?: A Little Help needed standing up from a chair using your arms (e.g., wheelchair or bedside chair)?: A Little Help needed to walk in hospital room?: A Little Help needed climbing 3-5 steps with a railing? : A Little 6 Click Score: 20    End of Session   Activity Tolerance: Patient  tolerated treatment well;Patient limited by fatigue;No increased pain Patient left: in chair;with call bell/phone within reach Nurse Communication: Mobility status PT Visit Diagnosis: Unsteadiness on feet (R26.81);Other abnormalities of gait and mobility (R26.89);Muscle weakness (generalized) (M62.81)    Time: 5997-7414 PT Time Calculation (min) (ACUTE ONLY): 16 min   Charges:   PT Evaluation $PT Eval Low Complexity: 1 Low PT Treatments $Therapeutic Activity: 8-22 mins        12:29 PM, 03/28/22 Lestine Box, S/PT

## 2022-03-29 ENCOUNTER — Inpatient Hospital Stay (HOSPITAL_COMMUNITY): Payer: BC Managed Care – PPO

## 2022-03-29 DIAGNOSIS — J189 Pneumonia, unspecified organism: Secondary | ICD-10-CM | POA: Diagnosis not present

## 2022-03-29 DIAGNOSIS — A419 Sepsis, unspecified organism: Secondary | ICD-10-CM | POA: Diagnosis not present

## 2022-03-29 DIAGNOSIS — R6520 Severe sepsis without septic shock: Secondary | ICD-10-CM | POA: Diagnosis not present

## 2022-03-29 LAB — CBC
HCT: 31.1 % — ABNORMAL LOW (ref 36.0–46.0)
HCT: 28.1 % — ABNORMAL LOW (ref 36.0–46.0)
Hemoglobin: 9.2 g/dL — ABNORMAL LOW (ref 12.0–15.0)
Hemoglobin: 8.2 g/dL — ABNORMAL LOW (ref 12.0–15.0)
MCH: 25.5 pg — ABNORMAL LOW (ref 26.0–34.0)
MCH: 25.2 pg — ABNORMAL LOW (ref 26.0–34.0)
MCHC: 29.6 g/dL — ABNORMAL LOW (ref 30.0–36.0)
MCHC: 29.2 g/dL — ABNORMAL LOW (ref 30.0–36.0)
MCV: 86.1 fL (ref 80.0–100.0)
MCV: 86.2 fL (ref 80.0–100.0)
Platelets: 272 10*3/uL (ref 150–400)
Platelets: 246 10*3/uL (ref 150–400)
RBC: 3.61 MIL/uL — ABNORMAL LOW (ref 3.87–5.11)
RBC: 3.26 MIL/uL — ABNORMAL LOW (ref 3.87–5.11)
RDW: 22.5 % — ABNORMAL HIGH (ref 11.5–15.5)
RDW: 22.5 % — ABNORMAL HIGH (ref 11.5–15.5)
WBC: 11 10*3/uL — ABNORMAL HIGH (ref 4.0–10.5)
WBC: 8.9 10*3/uL (ref 4.0–10.5)
nRBC: 0.2 % (ref 0.0–0.2)
nRBC: 0.3 % — ABNORMAL HIGH (ref 0.0–0.2)

## 2022-03-29 LAB — URINE CULTURE: Culture: 40000 — AB

## 2022-03-29 LAB — COMPREHENSIVE METABOLIC PANEL
ALT: 17 U/L (ref 0–44)
AST: 19 U/L (ref 15–41)
Albumin: 2.4 g/dL — ABNORMAL LOW (ref 3.5–5.0)
Alkaline Phosphatase: 69 U/L (ref 38–126)
Anion gap: 9 (ref 5–15)
BUN: 7 mg/dL (ref 6–20)
CO2: 21 mmol/L — ABNORMAL LOW (ref 22–32)
Calcium: 8 mg/dL — ABNORMAL LOW (ref 8.9–10.3)
Chloride: 110 mmol/L (ref 98–111)
Creatinine, Ser: 0.81 mg/dL (ref 0.44–1.00)
GFR, Estimated: >60 mL/min (ref >60)
Glucose, Bld: 124 mg/dL — ABNORMAL HIGH (ref 70–99)
Potassium: 3.6 mmol/L (ref 3.5–5.1)
Sodium: 140 mmol/L (ref 135–145)
Total Bilirubin: 0.5 mg/dL (ref 0.3–1.2)
Total Protein: 5 g/dL — ABNORMAL LOW (ref 6.5–8.1)

## 2022-03-29 LAB — TYPE AND SCREEN
ABO/RH(D): O POS
Antibody Screen: NEGATIVE

## 2022-03-29 LAB — GLUCOSE, CAPILLARY
Glucose-Capillary: 112 mg/dL — ABNORMAL HIGH (ref 70–99)
Glucose-Capillary: 163 mg/dL — ABNORMAL HIGH (ref 70–99)
Glucose-Capillary: 225 mg/dL — ABNORMAL HIGH (ref 70–99)
Glucose-Capillary: 144 mg/dL — ABNORMAL HIGH (ref 70–99)

## 2022-03-29 LAB — IRON AND TIBC
Iron: 38 ug/dL (ref 28–170)
Saturation Ratios: 16 % (ref 10.4–31.8)
TIBC: 235 ug/dL — ABNORMAL LOW (ref 250–450)
UIBC: 197 ug/dL

## 2022-03-29 LAB — RETICULOCYTES
Immature Retic Fract: 41.1 % — ABNORMAL HIGH (ref 2.3–15.9)
RBC.: 3.26 MIL/uL — ABNORMAL LOW (ref 3.87–5.11)
Retic Count, Absolute: 92.9 10*3/uL (ref 19.0–186.0)
Retic Ct Pct: 2.9 % (ref 0.4–3.1)

## 2022-03-29 LAB — MAGNESIUM: Magnesium: 1.9 mg/dL (ref 1.7–2.4)

## 2022-03-29 LAB — MRSA NEXT GEN BY PCR, NASAL: MRSA by PCR Next Gen: NOT DETECTED

## 2022-03-29 LAB — FOLATE: Folate: 26.2 ng/mL (ref >5.9)

## 2022-03-29 LAB — HEPARIN LEVEL (UNFRACTIONATED): Heparin Unfractionated: 0.4 IU/mL (ref 0.30–0.70)

## 2022-03-29 LAB — FERRITIN: Ferritin: 520 ng/mL — ABNORMAL HIGH (ref 11–307)

## 2022-03-29 LAB — C-REACTIVE PROTEIN: CRP: 5.1 mg/dL — ABNORMAL HIGH (ref <1.0)

## 2022-03-29 LAB — VITAMIN B12: Vitamin B-12: 1980 pg/mL — ABNORMAL HIGH (ref 180–914)

## 2022-03-29 MED ORDER — GABAPENTIN 300 MG PO CAPS
600.0000 mg | ORAL_CAPSULE | Freq: Three times a day (TID) | ORAL | Status: DC
  Administered 2022-03-29 – 2022-04-05 (×21): 600 mg via ORAL
  Filled 2022-03-29 (×21): qty 2

## 2022-03-29 MED ORDER — GABAPENTIN 300 MG PO CAPS
300.0000 mg | ORAL_CAPSULE | Freq: Three times a day (TID) | ORAL | Status: DC
Start: 2022-03-29 — End: 2022-03-29

## 2022-03-29 MED ORDER — VANCOMYCIN HCL 1250 MG/250ML IV SOLN
1250.0000 mg | INTRAVENOUS | Status: DC
  Administered 2022-03-29 – 2022-03-31 (×3): 1250 mg via INTRAVENOUS
  Filled 2022-03-29 (×3): qty 250

## 2022-03-29 MED ORDER — DULOXETINE HCL 30 MG PO CPEP
30.0000 mg | ORAL_CAPSULE | Freq: Every day | ORAL | Status: DC
  Administered 2022-03-30 – 2022-04-05 (×7): 30 mg via ORAL
  Filled 2022-03-29 (×7): qty 1

## 2022-03-29 MED ORDER — IPRATROPIUM-ALBUTEROL 0.5-2.5 (3) MG/3ML IN SOLN
3.0000 mL | Freq: Two times a day (BID) | RESPIRATORY_TRACT | Status: DC
  Administered 2022-03-29 – 2022-04-05 (×12): 3 mL via RESPIRATORY_TRACT
  Filled 2022-03-29 (×15): qty 3

## 2022-03-29 MED ORDER — TAPENTADOL HCL 50 MG PO TABS
50.0000 mg | ORAL_TABLET | Freq: Two times a day (BID) | ORAL | Status: DC | PRN
  Administered 2022-03-31 – 2022-04-04 (×8): 50 mg via ORAL
  Filled 2022-03-29 (×8): qty 1

## 2022-03-29 NOTE — Evaluation (Signed)
Clinical/Bedside Swallow Evaluation Patient Details  Name: Reka Wist MRN: [id number] Date of Birth: 10/20/1966  Today's Date: 03/29/2022 Time: SLP Start Time (ACUTE ONLY): 1200 SLP Stop Time (ACUTE ONLY): 1220 SLP Time Calculation (min) (ACUTE ONLY): 20 min  Past Medical History:  Past Medical History:  Diagnosis Date   Chronic cough    Chronic pain    Diabetes mellitus without complication (HCC)    Dyspnea    GERD (gastroesophageal reflux disease)    Hypokalemia    Hypothyroidism    Left wrist pain 06/01/2021   Myonecrosis (HCC) 07/01/2021   Neurocardiogenic syncope    OSA (obstructive sleep apnea)    does not use CPAP   Osteomyelitis (HCC)    bilateral feet   Peripheral neuropathy    Presence of intrathecal pump    recieves Prialt/bupivicaine   Tear of gluteus medius tendon 07/01/2021   Wegener's granulomatosis 2009   Wegner's disease (congenital syphilitic osteochondritis)    Past Surgical History:  Past Surgical History:  Procedure Laterality Date   AMPUTATION Right 10/21/2020   Procedure: Right hallux amputation;  Surgeon: Toni Arthurs, MD;  Location: [facility];  Service: Orthopedics;  Laterality: Right;   AMPUTATION TOE Bilateral 08/12/2020   Procedure: Right 3rd toe amputation; left hallux and 3rd toe amputation;  Surgeon: Toni Arthurs, MD;  Location: [facility];  Service: Orthopedics;  Laterality: Bilateral;    AMPUTATION TOE Right 03/24/2021   Procedure: AMPUTATION TOE;  Surgeon: Toni Arthurs, MD;  Location: [facility] OR;  Service: Orthopedics;  Laterality: Right;   APPENDECTOMY     BACK SURGERY     CHOLECYSTECTOMY     COLONOSCOPY N/A 06/02/2013   Procedure: COLONOSCOPY;  Surgeon: Graylin Shiver, MD;  Location: [facility];  Service: Endoscopy;  Laterality: N/A;   HERNIA REPAIR  2000   INCISION AND DRAINAGE ABSCESS Left 07/07/2016   Procedure: DEBRIDMENT LEFT THIGH ABSCESS, EXISION ACUTE SKIN RASH LEFT THIGH(1CM LESION);  Surgeon: Claud Kelp, MD;  Location: [facility];  Service: General;  Laterality: Left;   SEPTOPLASTY  02/2013   spleen anuyism     splenic aneurysm     TRANSMETATARSAL AMPUTATION Bilateral 10/21/2020   Procedure: Left transmetatarsal amputation;  Surgeon: Toni Arthurs, MD;  Location: [facility];  Service: Orthopedics;  Laterality: Bilateral;   TRANSMETATARSAL AMPUTATION Left 03/24/2021   Procedure: TRANSMETATARSAL AMPUTATION;  Surgeon: Toni Arthurs, MD;  Location[facility];  Service: Orthopedics;  Laterality: Left;   VAGINAL HYSTERECTOMY     HPI:  56 yo female was brought to [facility] ER with AMS.  SpO2 in 70's on room air.  She was noted to have fever 104F, and hypotensive.  She was started on antibiotics for pneumonia and UTI.  CT chest showed small PEs.  She was started on heparin infusion.  She had persistent hemoptysis.  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  She had sputum culture from 02/21/22 that grew MRSA.  Night time aspiration suspected.    Assessment / Plan / Recommendation  Clinical Impression  Pt demonstrates no overt signs of aspiration. She does verbalize frequent globus and regurgitation with meats as well as a history of esophageal stricture. Pt may benefit from GI assessment. Additionally, pt could improve her oral hygiene routine to reduce bacterial load and improve her sleeping position to reduce possiblity of night time reflux. Pt repeated back education. SLP reported to MD. No SLP f/u needed at this time. Education complete. SLP Visit  Diagnosis: Dysphagia, unspecified (R13.10)    Aspiration Risk  Moderate aspiration risk    Diet Recommendation Thin liquid;Dysphagia 3 (Mech soft)   Liquid Administration via: Cup;Straw Medication Administration: Whole meds with liquid Supervision: Patient able to self feed Compensations: Slow rate;Small sips/bites    Other  Recommendations Recommended Consults: Consider GI evaluation    Recommendations for follow up  therapy are one component of a multi-disciplinary discharge planning process, led by the attending physician.  Recommendations may be updated based on patient status, additional functional criteria and insurance authorization.  Follow up Recommendations No SLP follow up      Assistance Recommended at Discharge    Functional Status Assessment    Frequency and Duration            Prognosis        Swallow Study   General HPI: 56 yo female was brought to [facility] ER with AMS.  SpO2 in 70's on room air.  She was noted to have fever 104F, and hypotensive.  She was started on antibiotics for pneumonia and UTI.  CT chest showed small PEs.  She was started on heparin infusion.  She had persistent hemoptysis.  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  She had sputum culture from 02/21/22 that grew MRSA.  Night time aspiration suspected. Type of Study: Bedside Swallow Evaluation Diet Prior to this Study: Dysphagia 3 (soft);Thin liquids Temperature Spikes Noted: No Respiratory Status: Nasal cannula History of Recent Intubation: No Behavior/Cognition: Alert;Cooperative;Pleasant mood Oral Cavity Assessment: Within Functional Limits Oral Care Completed by SLP: No Oral Cavity - Dentition: Adequate natural dentition Self-Feeding Abilities: Able to feed self Patient Positioning: Upright in bed Baseline Vocal Quality: Normal Volitional Cough: Strong    Oral/Motor/Sensory Function Overall Oral Motor/Sensory Function: Within functional limits   Ice Chips     Thin Liquid Thin Liquid: Within functional limits    Nectar Thick Nectar Thick Liquid: Not tested   Honey Thick Honey Thick Liquid: Not tested   Puree Puree: Within functional limits   Solid     Solid: Within functional limits      Rickia [place], Riley Nearing 03/29/2022,1:56 PM

## 2022-03-29 NOTE — Consult Note (Signed)
[facility]  Total days of antibiotics 4/meropenem/bactrim/vanco               Reason for Consult: atypical pneumonia/ ? NTM pulmonary infection   Referring Physician: singh  Principal Problem:   Severe sepsis ([place]) Active Problems:   Presence of intrathecal pump   Chemotherapy-induced peripheral neuropathy ([place])   Secondary adrenal insufficiency (HCC)   Stage 3a chronic kidney disease (HCC)   Lobar pneumonia (HCC)   Granulomatosis with polyangiitis (HCC)   Class 2 obesity   UTI (urinary tract infection)   Opioid dependence ([place])   Acute metabolic encephalopathy   Acute pulmonary embolus (HCC)    HPI: [name] is a 56 y.o. 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 She did undergo bronchoscopy in march 20th by her pulmonologist cx grew candida initially, then m.gordonae identified on may 9th. She was last hospitalized in early may from MRSA pneumonia. She is readmitted from having AMS in the setting of high fever of 104F in the ED, also found to be hypotensive, and hypoxic. Work up thus far showing chest CT with small PEs thus on heparin drip. Chest CT also shows bilateral patchy/ground glass areas of involvement. No cavitary lesions.    Past Medical History:  Diagnosis Date   Chronic cough    Chronic pain    Diabetes mellitus without complication (HCC)    Dyspnea    GERD (gastroesophageal reflux disease)    Hypokalemia    Hypothyroidism    Left wrist pain 06/01/2021   Myonecrosis ([place] 07/01/2021   Neurocardiogenic syncope    OSA (obstructive sleep apnea)    does not use CPAP   Osteomyelitis (HCC)    bilateral feet   Peripheral neuropathy    Presence of intrathecal pump    recieves Prialt/bupivicaine   Tear of gluteus medius tendon 07/01/2021    Wegener's granulomatosis 2009   Wegner's disease (congenital syphilitic osteochondritis)     Allergies:  Allergies  Allergen Reactions   Ibuprofen Other (See Comments)    Pt is unable to take this due to kidney problems.      Penicillins Rash and Other (See Comments)    Has patient had a PCN reaction causing immediate rash, facial/tongue/throat swelling, SOB or lightheadedness with hypotension: No Has patient had a PCN reaction causing severe rash involving mucus membranes or skin necrosis: No Has patient had a PCN reaction that required hospitalization No Has patient had a PCN reaction occurring within the last 10 years: No If all of the above answers are "NO", then may proceed with Cephalosporin use.    Current antibiotics:   MEDICATIONS:  amitriptyline  50 mg Oral QHS   budesonide (PULMICORT) nebulizer solution  0.5 mg Nebulization BID   Chlorhexidine Gluconate Cloth  6 each Topical Daily   [START ON 03/30/2022] DULoxetine  30 mg Oral Daily   folic acid  2 mg Oral Daily   gabapentin  600 mg Oral TID   insulin aspart  0-9 Units Subcutaneous TID WC   ipratropium-albuterol  3 mL Nebulization BID   linaclotide  145 mcg Oral QAC breakfast   pantoprazole  40 mg Oral Daily   predniSONE  20 mg Oral Q breakfast   rosuvastatin  10 mg Oral QHS   sulfamethoxazole-trimethoprim  1 tablet Oral  Once per day on Mon Wed Fri   vitamin B-12  1,000 mcg Oral Daily    Social History   Tobacco Use   Smoking status: Never   Smokeless tobacco: Never  Vaping Use   Vaping Use: Never used  Substance Use Topics   Alcohol use: Yes    Comment: occasionally   Drug use: No    Family History  Problem Relation Age of Onset   Diabetes Mother    Heart disease Mother    Diabetes Father     Review of Systems  Constitutional: Negative for fever, chills, diaphoresis, activity change, appetite change, fatigue and unexpected weight change.  HENT: Negative for congestion, sore throat, rhinorrhea,  sneezing, trouble swallowing and sinus pressure.  Eyes: Negative for photophobia and visual disturbance.  Respiratory: productive for cough, chest tightness, shortness of breath, wheezing and stridor.  Cardiovascular: Negative for chest pain, palpitations and leg swelling.  Gastrointestinal: Negative for nausea, vomiting, abdominal pain, diarrhea, constipation, blood in stool, abdominal distention and anal bleeding.  Genitourinary: Negative for dysuria, hematuria, flank pain and difficulty urinating.  Musculoskeletal: Negative for myalgias, back pain, joint swelling, arthralgias and gait problem.  Skin: Negative for color change, pallor, rash and wound.  Neurological: Negative for dizziness, tremors, weakness and light-headedness. But occasional syncopal like events once a week Hematological: Negative for adenopathy. Does not bruise/bleed easily.  Psychiatric/Behavioral: Negative for behavioral problems, confusion, sleep disturbance, dysphoric mood, decreased concentration and agitation.    OBJECTIVE: Temp:  [97.6 F (36.4 C)-98.1 F (36.7 C)] 98.1 F (36.7 C) (06/07 1221) Pulse Rate:  [55-80] 78 (06/07 1221) Resp:  [16-18] 18 (06/07 1221) BP: (115-145)/(67-99) 140/99 (06/07 1221) SpO2:  [93 %-99 %] 98 % (06/07 1221) Weight:  [91.4 kg] 91.4 kg (06/06 2319) Physical Exam  Constitutional:  oriented to person, place, and time. appears well-developed and well-nourished. No distress.  HENT: [place]/AT, PERRLA, no scleral icterus Mouth/Throat: Oropharynx is clear and moist. No oropharyngeal exudate.  Cardiovascular: Normal rate, regular rhythm and normal heart sounds. Exam reveals no gallop and no friction rub.  No murmur heard.  Pulmonary/Chest: Effort normal and breath sounds normal. No respiratory distress.  has no wheezes.  Neck = supple, no nuchal rigidity Abdominal: Soft. Bowel sounds are normal.  exhibits no distension. There is no tenderness.  Lymphadenopathy: no cervical adenopathy. No  axillary adenopathy Neurological: alert and oriented to person, place, and time.  Skin: Skin is warm and dry. No rash noted. No erythema. Small ulcer to right TM site Psychiatric: a normal mood and affect.  behavior is normal.    LABS: Results for orders placed or performed during the hospital encounter of 03/26/22 (from the past 48 hour(s))  Glucose, capillary     Status: Abnormal   Collection Time: 03/27/22  4:21 PM  Result Value Ref Range   Glucose-Capillary 184 (H) 70 - 99 mg/dL    Comment: Glucose reference range applies only to samples taken after fasting for at least 8 hours.  Glucose, capillary     Status: Abnormal   Collection Time: 03/27/22  9:04 PM  Result Value Ref Range   Glucose-Capillary 197 (H) 70 - 99 mg/dL    Comment: Glucose reference range applies only to samples taken after fasting for at least 8 hours.  Heparin level (unfractionated)     Status: Abnormal   Collection Time: 03/27/22 10:22 PM  Result Value Ref Range   Heparin Unfractionated >1.10 (H) 0.30 - 0.70 IU/mL    Comment: (NOTE) The  clinical reportable range upper limit is being lowered to >1.10 to align with the FDA approved guidance for the current laboratory assay.  If heparin results are below expected values, and patient dosage has  been confirmed, suggest follow up testing of antithrombin III levels. Performed at [facility], [address], [place], [place] [address]   Procalcitonin     Status: None   Collection Time: 03/28/22  4:02 AM  Result Value Ref Range   Procalcitonin 0.50 ng/mL    Comment:        Interpretation: PCT (Procalcitonin) <= 0.5 ng/mL: Systemic infection (sepsis) is not likely. Local bacterial infection is possible. (NOTE)       Sepsis PCT Algorithm           Lower Respiratory Tract                                      Infection PCT Algorithm    ----------------------------     ----------------------------         PCT < 0.25 ng/mL                PCT < 0.10 ng/mL           Strongly encourage             Strongly discourage   discontinuation of antibiotics    initiation of antibiotics    ----------------------------     -----------------------------       PCT 0.25 - 0.50 ng/mL            PCT 0.10 - 0.25 ng/mL               OR       >80% decrease in PCT            Discourage initiation of                                            antibiotics      Encourage discontinuation           of antibiotics    ----------------------------     -----------------------------         PCT >= 0.50 ng/mL              PCT 0.26 - 0.50 ng/mL               AND        <80% decrease in PCT             Encourage initiation of                                             antibiotics       Encourage continuation           of antibiotics    ----------------------------     -----------------------------        PCT >= 0.50 ng/mL                  PCT > 0.50 ng/mL               AND         increase in  PCT                  Strongly encourage                                      initiation of antibiotics    Strongly encourage escalation           of antibiotics                                     -----------------------------                                           PCT <= 0.25 ng/mL                                                 OR                                        > 80% decrease in PCT                                      Discontinue / Do not initiate                                             antibiotics  Performed at [facility], [address] [place], [place] [address]   CBC     Status: Abnormal   Collection Time: 03/28/22  4:02 AM  Result Value Ref Range   WBC 13.8 (H) 4.0 - 10.5 K/uL   RBC 3.04 (L) 3.87 - 5.11 MIL/uL   Hemoglobin 7.8 (L) 12.0 - 15.0 g/dL   HCT 26.6 (L) 36.0 - 46.0 %   MCV 87.5 80.0 - 100.0 fL   MCH 25.7 (L) 26.0 - 34.0 pg   MCHC 29.3 (L) 30.0 - 36.0 g/dL   RDW 22.4 (H) 11.5 - 15.5 %   Platelets 228 150 - 400 K/uL   nRBC 0.1 0.0 - 0.2 %     Comment: Performed at [facility], [address] [place], [place] [address]  Basic metabolic panel     Status: Abnormal   Collection Time: 03/28/22  4:02 AM  Result Value Ref Range   Sodium 141 135 - 145 mmol/L   Potassium 3.1 (L) 3.5 - 5.1 mmol/L   Chloride 121 (H) 98 - 111 mmol/L   CO2 18 (L) 22 - 32 mmol/L   Glucose, Bld 148 (H) 70 - 99 mg/dL    Comment: Glucose reference range applies only to samples taken after fasting for at least 8 hours.   BUN 11 6 - 20 mg/dL   Creatinine, Ser 0.69 0.44 - 1.00 mg/dL   Calcium 7.3 (L) 8.9 - 10.3 mg/dL   GFR, Estimated >60 >60 mL/min  Comment: (NOTE) Calculated using the CKD-EPI Creatinine Equation (2021)    Anion gap <3 (L) 5 - 15    Comment: Electrolytes repeated to confirm. Performed at [facility], [address] [place], [place] [address]   Magnesium     Status: None   Collection Time: 03/28/22  4:02 AM  Result Value Ref Range   Magnesium 2.0 1.7 - 2.4 mg/dL    Comment: Performed at [facility], [address], [place], [place] [address]  Glucose, capillary     Status: Abnormal   Collection Time: 03/28/22  7:22 AM  Result Value Ref Range   Glucose-Capillary 114 (H) 70 - 99 mg/dL    Comment: Glucose reference range applies only to samples taken after fasting for at least 8 hours.  Heparin level (unfractionated)     Status: Abnormal   Collection Time: 03/28/22  7:53 AM  Result Value Ref Range   Heparin Unfractionated 0.94 (H) 0.30 - 0.70 IU/mL    Comment: (NOTE) The clinical reportable range upper limit is being lowered to >1.10 to align with the FDA approved guidance for the current laboratory assay.  If heparin results are below expected values, and patient dosage has  been confirmed, suggest follow up testing of antithrombin III levels. Performed at [facility], [address], [place], [place] [address]   Troponin I (High Sensitivity)     Status: None   Collection Time: 03/28/22  7:53 AM  Result Value Ref Range    Troponin I (High Sensitivity) 8 <18 ng/L    Comment: (NOTE) Elevated high sensitivity troponin I (hsTnI) values and significant  changes across serial measurements may suggest ACS but many other  chronic and acute conditions are known to elevate hsTnI results.  Refer to the "Links" section for chest pain algorithms and additional  guidance. Performed at [facility], [address] [place], [place] [address]   Glucose, capillary     Status: None   Collection Time: 03/28/22 11:02 AM  Result Value Ref Range   Glucose-Capillary 94 70 - 99 mg/dL    Comment: Glucose reference range applies only to samples taken after fasting for at least 8 hours.  Troponin I (High Sensitivity)     Status: None   Collection Time: 03/28/22  1:17 PM  Result Value Ref Range   Troponin I (High Sensitivity) 10 <18 ng/L    Comment: (NOTE) Elevated high sensitivity troponin I (hsTnI) values and significant  changes across serial measurements may suggest ACS but many other  chronic and acute conditions are known to elevate hsTnI results.  Refer to the "Links" section for chest pain algorithms and additional  guidance. Performed at [facility], [address] [place], [place] [address]   Heparin level (unfractionated)     Status: None   Collection Time: 03/28/22  3:45 PM  Result Value Ref Range   Heparin Unfractionated 0.63 0.30 - 0.70 IU/mL    Comment: (NOTE) The clinical reportable range upper limit is being lowered to >1.10 to align with the FDA approved guidance for the current laboratory assay.  If heparin results are below expected values, and patient dosage has  been confirmed, suggest follow up testing of antithrombin III levels. Performed at [facility], [address], [place], [place] [address]   Glucose, capillary     Status: Abnormal   Collection Time: 03/28/22  4:04 PM  Result Value Ref Range   Glucose-Capillary 262 (H) 70 - 99 mg/dL    Comment: Glucose reference  range applies only to  samples taken after fasting for at least 8 hours.  Glucose, capillary     Status: Abnormal   Collection Time: 03/28/22  8:12 PM  Result Value Ref Range   Glucose-Capillary 210 (H) 70 - 99 mg/dL    Comment: Glucose reference range applies only to samples taken after fasting for at least 8 hours.  Heparin level (unfractionated)     Status: None   Collection Time: 03/28/22 10:18 PM  Result Value Ref Range   Heparin Unfractionated 0.62 0.30 - 0.70 IU/mL    Comment: (NOTE) The clinical reportable range upper limit is being lowered to >1.10 to align with the FDA approved guidance for the current laboratory assay.  If heparin results are below expected values, and patient dosage has  been confirmed, suggest follow up testing of antithrombin III levels. Performed at [facility], [address], [place], [place] [address]   CBC     Status: Abnormal   Collection Time: 03/29/22  1:42 AM  Result Value Ref Range   WBC 11.0 (H) 4.0 - 10.5 K/uL   RBC 3.61 (L) 3.87 - 5.11 MIL/uL   Hemoglobin 9.2 (L) 12.0 - 15.0 g/dL   HCT 31.1 (L) 36.0 - 46.0 %   MCV 86.1 80.0 - 100.0 fL   MCH 25.5 (L) 26.0 - 34.0 pg   MCHC 29.6 (L) 30.0 - 36.0 g/dL   RDW 22.5 (H) 11.5 - 15.5 %   Platelets 272 150 - 400 K/uL   nRBC 0.2 0.0 - 0.2 %    Comment: Performed at [facility], [place] [address], [place], [place] [address]  Heparin level (unfractionated)     Status: None   Collection Time: 03/29/22  1:42 AM  Result Value Ref Range   Heparin Unfractionated 0.40 0.30 - 0.70 IU/mL    Comment: (NOTE) The clinical reportable range upper limit is being lowered to >1.10 to align with the FDA approved guidance for the current laboratory assay.  If heparin results are below expected values, and patient dosage has  been confirmed, suggest follow up testing of antithrombin III levels. Performed at [facility], [place] [address], [place], [place] [address]   CBC     Status: Abnormal   Collection Time: 03/29/22   6:22 AM  Result Value Ref Range   WBC 8.9 4.0 - 10.5 K/uL   RBC 3.26 (L) 3.87 - 5.11 MIL/uL   Hemoglobin 8.2 (L) 12.0 - 15.0 g/dL   HCT 28.1 (L) 36.0 - 46.0 %   MCV 86.2 80.0 - 100.0 fL   MCH 25.2 (L) 26.0 - 34.0 pg   MCHC 29.2 (L) 30.0 - 36.0 g/dL   RDW 22.5 (H) 11.5 - 15.5 %   Platelets 246 150 - 400 K/uL   nRBC 0.3 (H) 0.0 - 0.2 %    Comment: Performed at [facility], [place] [address] [place] [address] [place], [place] [address]  C-reactive protein     Status: Abnormal   Collection Time: 03/29/22  6:22 AM  Result Value Ref Range   CRP 5.1 (H) <1.0 mg/dL    Comment: Performed at [facility] [address] [place], [place] [address]  Comprehensive metabolic panel     Status: Abnormal   Collection Time: 03/29/22  6:22 AM  Result Value Ref Range   Sodium 140 135 - 145 mmol/L   Potassium 3.6 3.5 - 5.1 mmol/L   Chloride 110 98 - 111 mmol/L  CO2 21 (L) 22 - 32 mmol/L   Glucose, Bld 124 (H) 70 - 99 mg/dL    Comment: Glucose reference range applies only to samples taken after fasting for at least 8 hours.   BUN 7 6 - 20 mg/dL   Creatinine, Ser 0.81 0.44 - 1.00 mg/dL   Calcium 8.0 (L) 8.9 - 10.3 mg/dL   Total Protein 5.0 (L) 6.5 - 8.1 g/dL   Albumin 2.4 (L) 3.5 - 5.0 g/dL   AST 19 15 - 41 U/L   ALT 17 0 - 44 U/L   Alkaline Phosphatase 69 38 - 126 U/L   Total Bilirubin 0.5 0.3 - 1.2 mg/dL   GFR, Estimated >60 >60 mL/min    Comment: (NOTE) Calculated using the CKD-EPI Creatinine Equation (2021)    Anion gap 9 5 - 15    Comment: Performed at [facility], [place] [address], [place], [place] [address]  Magnesium     Status: None   Collection Time: 03/29/22  6:22 AM  Result Value Ref Range   Magnesium 1.9 1.7 - 2.4 mg/dL    Comment: Performed at [facility] [address] [place], [place] [address]  Type and screen     Status: None   Collection Time: 03/29/22  6:22 AM  Result Value Ref Range   ABO/RH(D) O POS    Antibody Screen NEG    Sample Expiration       04/01/2022,2359 Performed at [facility], [place] [address], [place], [place] [address]   Vitamin B12     Status: Abnormal   Collection Time: 03/29/22  6:22 AM  Result Value Ref Range   Vitamin B-12 1,980 (H) 180 - 914 pg/mL    Comment: (NOTE) This assay is not validated for testing neonatal or myeloproliferative syndrome specimens for Vitamin B12 levels. Performed at [facility], [place] [address], [place], [place] [address]   Iron and TIBC     Status: Abnormal   Collection Time: 03/29/22  6:22 AM  Result Value Ref Range   Iron 38 28 - 170 ug/dL   TIBC 235 (L) 250 - 450 ug/dL   Saturation Ratios 16 10.4 - 31.8 %   UIBC 197 ug/dL    Comment: Performed at [facility], [place] [address], [place], [place] [address]  Ferritin     Status: Abnormal   Collection Time: 03/29/22  6:22 AM  Result Value Ref Range   Ferritin 520 (H) 11 - 307 ng/mL    Comment: Performed at [facility], [place] [address], [place], [place] [address]  Reticulocytes     Status: Abnormal   Collection Time: 03/29/22  6:22 AM  Result Value Ref Range   Retic Ct Pct 2.9 0.4 - 3.1 %   RBC. 3.26 (L) 3.87 - 5.11 MIL/uL   Retic Count, Absolute 92.9 19.0 - 186.0 K/uL   Immature Retic Fract 41.1 (H) 2.3 - 15.9 %    Comment: Performed at [facility] [address], [place], [place] [address]  Folate     Status: None   Collection Time: 03/29/22  6:22 AM  Result Value Ref Range   Folate 26.2 >5.9 ng/mL    Comment: RESULTS CONFIRMED BY MANUAL DILUTION Performed at [facility], [place] [address] [place], [place] [address]   MRSA Next Gen by PCR, Nasal     Status: None   Collection Time: 03/29/22  6:34 AM  Specimen: Nasal Mucosa; Nasal Swab  Result Value Ref Range   MRSA by PCR Next Gen NOT DETECTED NOT DETECTED    Comment: (NOTE) The GeneXpert MRSA Assay (FDA approved for NASAL specimens only), is one component of a comprehensive MRSA colonization surveillance program. It is not intended  to diagnose MRSA infection nor to guide or monitor treatment for MRSA infections. Test performance is not FDA approved in patients less than 61 years old. Performed at [facility], [place] [address], [place], [place] [address]   Glucose, capillary     Status: Abnormal   Collection Time: 03/29/22  8:00 AM  Result Value Ref Range   Glucose-Capillary 112 (H) 70 - 99 mg/dL    Comment: Glucose reference range applies only to samples taken after fasting for at least 8 hours.  Glucose, capillary     Status: Abnormal   Collection Time: 03/29/22 12:16 PM  Result Value Ref Range   Glucose-Capillary 163 (H) 70 - 99 mg/dL    Comment: Glucose reference range applies only to samples taken after fasting for at least 8 hours.    MICRO: 6/4 urine cx 40,000 e.coli 6/4 blood cx ngtd ---------------- 5/2 respiratory cx MRSA IMAGING: DG Chest Port 1 View  Result Date: 03/29/2022 CLINICAL DATA:  Shortness of breath and cough. EXAM: PORTABLE CHEST 1 VIEW COMPARISON:  03/26/2022 FINDINGS: Stable cardiomediastinal contours. Lungs are suboptimally inflated. No signs of pleural effusion or edema. Persistent asymmetric airspace opacification within the right upper lobe. IMPRESSION: Persistent asymmetric airspace opacification within the right upper lobe compatible with pneumonia. Electronically Signed   By: Kerby Moors M.D.   On: 03/29/2022 06:43   ECHOCARDIOGRAM COMPLETE  Result Date: 03/28/2022    ECHOCARDIOGRAM REPORT   Patient Name:   [name] Date of Exam: 03/28/2022 Medical Rec #:  [id number]   Height:       64.0 in Accession #:    [id number]  Weight:       211.4 lb Date of Birth:  17-Jul-1966   BSA:          2.003 m Patient Age:    56 years    BP:           121/84 mmHg Patient Gender: F           HR:           86 bpm. Exam Location:  [facility] Procedure: 2D Echo, Cardiac Doppler and Color Doppler Indications:    Pulmonary embolus  History:        Patient has no prior history of Echocardiogram  examinations.                 Risk Factors:Hypertension and Dyslipidemia.  Sonographer:    Wenda Low Referring Phys: [phone] DAVID TAT IMPRESSIONS  1. Left ventricular ejection fraction, by estimation, is 55%. The left ventricle has normal function. The left ventricle has no regional wall motion abnormalities. Left ventricular diastolic parameters were normal.  2. Right ventricular systolic function is normal. The right ventricular size is normal. There is normal pulmonary artery systolic pressure.  3. The pericardial effusion is anterior to the right ventricle.  4. The mitral valve is abnormal. Trivial mitral valve regurgitation. No evidence of mitral stenosis.  5. The aortic valve is tricuspid. There is mild calcification of the aortic valve. Aortic valve regurgitation is not visualized. Aortic valve sclerosis is present, with no evidence of aortic valve stenosis.  6. The inferior vena cava is normal  in size with greater than 50% respiratory variability, suggesting right atrial pressure of 3 mmHg. FINDINGS  Left Ventricle: Left ventricular ejection fraction, by estimation, is 55%. The left ventricle has normal function. The left ventricle has no regional wall motion abnormalities. The left ventricular internal cavity size was normal in size. There is no left ventricular hypertrophy. Left ventricular diastolic parameters were normal. Right Ventricle: The right ventricular size is normal. No increase in right ventricular wall thickness. Right ventricular systolic function is normal. There is normal pulmonary artery systolic pressure. The tricuspid regurgitant velocity is 1.76 m/s, and  with an assumed right atrial pressure of 3 mmHg, the estimated right ventricular systolic pressure is 60.4 mmHg. Left Atrium: Left atrial size was normal in size. Right Atrium: Right atrial size was normal in size. Pericardium: Trivial pericardial effusion is present. The pericardial effusion is anterior to the right ventricle.  Mitral Valve: The mitral valve is abnormal. There is mild thickening of the mitral valve leaflet(s). There is mild calcification of the mitral valve leaflet(s). Trivial mitral valve regurgitation. No evidence of mitral valve stenosis. MV peak gradient, 6.5 mmHg. The mean mitral valve gradient is 3.0 mmHg. Tricuspid Valve: The tricuspid valve is normal in structure. Tricuspid valve regurgitation is trivial. No evidence of tricuspid stenosis. Aortic Valve: The aortic valve is tricuspid. There is mild calcification of the aortic valve. Aortic valve regurgitation is not visualized. Aortic valve sclerosis is present, with no evidence of aortic valve stenosis. Aortic valve mean gradient measures 5.0 mmHg. Aortic valve peak gradient measures 10.8 mmHg. Aortic valve area, by VTI measures 2.22 cm. Pulmonic Valve: The pulmonic valve was normal in structure. Pulmonic valve regurgitation is not visualized. No evidence of pulmonic stenosis. Aorta: The aortic root is normal in size and structure. Venous: The inferior vena cava is normal in size with greater than 50% respiratory variability, suggesting right atrial pressure of 3 mmHg. IAS/Shunts: No atrial level shunt detected by color flow Doppler.  LEFT VENTRICLE PLAX 2D LVIDd:         4.90 cm     Diastology LVIDs:         3.50 cm     LV e' medial:    7.72 cm/s LV PW:         1.00 cm     LV E/e' medial:  15.2 LV IVS:        0.90 cm     LV e' lateral:   14.60 cm/s LVOT diam:     2.00 cm     LV E/e' lateral: 8.0 LV SV:         76 LV SV Index:   38 LVOT Area:     3.14 cm  LV Volumes (MOD) LV vol d, MOD A2C: 60.2 ml LV vol d, MOD A4C: 49.4 ml LV vol s, MOD A2C: 24.4 ml LV vol s, MOD A4C: 23.8 ml LV SV MOD A2C:     35.8 ml LV SV MOD A4C:     49.4 ml LV SV MOD BP:      30.8 ml RIGHT VENTRICLE RV Basal diam:  3.15 cm RV Mid diam:    2.80 cm RV S prime:     13.40 cm/s TAPSE (M-mode): 2.3 cm LEFT ATRIUM             Index        RIGHT ATRIUM           Index LA diam:  3.70 cm 1.85  cm/m   RA Area:     15.50 cm LA Vol (A2C):   46.0 ml 22.96 ml/m  RA Volume:   37.70 ml  18.82 ml/m LA Vol (A4C):   50.9 ml 25.41 ml/m LA Biplane Vol: 50.3 ml 25.11 ml/m  AORTIC VALVE                     PULMONIC VALVE AV Area (Vmax):    2.38 cm      PV Vmax:       0.90 m/s AV Area (Vmean):   2.30 cm      PV Peak grad:  3.2 mmHg AV Area (VTI):     2.22 cm AV Vmax:           164.00 cm/s AV Vmean:          107.000 cm/s AV VTI:            0.344 m AV Peak Grad:      10.8 mmHg AV Mean Grad:      5.0 mmHg LVOT Vmax:         124.00 cm/s LVOT Vmean:        78.500 cm/s LVOT VTI:          0.243 m LVOT/AV VTI ratio: 0.71  AORTA Ao Root diam: 2.90 cm MITRAL VALVE                TRICUSPID VALVE MV Area (PHT): 3.68 cm     TR Peak grad:   12.4 mmHg MV Area VTI:   2.13 cm     TR Vmax:        176.00 cm/s MV Peak grad:  6.5 mmHg MV Mean grad:  3.0 mmHg     SHUNTS MV Vmax:       1.27 m/s     Systemic VTI:  0.24 m MV Vmean:      77.6 cm/s    Systemic Diam: 2.00 cm MV Decel Time: 206 msec MV E velocity: 117.00 cm/s MV A velocity: 89.50 cm/s MV E/A ratio:  1.31 Jenkins Rouge MD Electronically signed by Jenkins Rouge MD Signature Date/Time: 03/28/2022/12:20:15 PM    Final     HISTORICAL MICRO/IMAGING  Assessment/Plan:  56yo F immunocompromised host with recurrent pneumonia. Unclear if atypical infection vs. Wegener's flare or methotrexate SE. 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 Continue to monitor  Altered mental status spells = if continues may need eeg, vs. Brain imaging (but she has spinal stimulator- unsure if compatible)  Asymptomatic bactiuria = would not recommend treating/ though it would be covered given she is currently on broad  spectrum abtx  Wegener's = continue on pred 31m daily. May need to increase if felt to have adrenal insufficiency.

## 2022-03-29 NOTE — Plan of Care (Signed)
  Problem: Education: Goal: Knowledge of General Education information will improve Description: Including pain rating scale, medication(s)/side effects and non-pharmacologic comfort measures Outcome: Progressing   Problem: Health Behavior/Discharge Planning: Goal: Ability to manage health-related needs will improve Outcome: Progressing   Problem: Clinical Measurements: Goal: Ability to maintain clinical measurements within normal limits will improve Outcome: Progressing Goal: Will remain free from infection Outcome: Progressing Goal: Diagnostic test results will improve Outcome: Progressing Goal: Respiratory complications will improve Outcome: Progressing Goal: Cardiovascular complication will be avoided Outcome: Progressing   Problem: Activity: Goal: Risk for activity intolerance will decrease Outcome: Progressing   Problem: Nutrition: Goal: Adequate nutrition will be maintained Outcome: Progressing   Problem: Coping: Goal: Level of anxiety will decrease Outcome: Progressing   Problem: Elimination: Goal: Will not experience complications related to bowel motility Outcome: Progressing Goal: Will not experience complications related to urinary retention Outcome: Progressing   Problem: Pain Managment: Goal: General experience of comfort will improve Outcome: Progressing   Problem: Safety: Goal: Ability to remain free from injury will improve Outcome: Progressing   Problem: Education: Goal: Ability to describe self-care measures that may prevent or decrease complications (Diabetes Survival Skills Education) will improve Outcome: Progressing Goal: Individualized Educational Video(s) Outcome: Progressing

## 2022-03-29 NOTE — H&P (View-Only) (Signed)
Pulmonary and Critical Care Medicine   Patient name: [name] Admit date: 03/26/2022  DOB: 23-Nov-1965 LOS: 3  MRN: [id number] Consult date: 03/28/2022  Referring provider: Dr. Carles Collet, [facility] CC: Hemoptysis    History:  56 yo female was brought to [place] ER with AMS.  SpO2 in 70's on room air.  She was noted to have fever 104F, and hypotensive.  She was started on antibiotics for pneumonia and UTI.  CT chest showed small PEs.  She was started on heparin infusion.  She had persistent hemoptysis.  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  She had sputum culture from 02/21/22 that grew MRSA.    He had recent hospitalization for myositis (02/19/22 to 02/23/22) and treated with clindamycin and steroid taper.    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  She is followed by Dr. Valentino Nose with rheumatology and Dr. Dionne Milo with pulmonary.  Past medical history:  Chronic pain, DM type 2, GERD, Hypothyroidism, OSA, Osteomyelitis, Neuropathy, Steroid induced adrenal insufficiency, COVID 19 infection January 2022  Significant events:  6/04 Admit, start ABx 6/05 start heparin gtt 03/28/2022 transferred to [facility]  Studies:  CT angio chest 03/27/22 >> 2 small filling defects in RLL and RML, small b/l effusions, b/l infiltrates Echo 03/28/22 >> EF 55%   Micro:  COVID/Flu 6/04 >> negative Blood 6/04 >> Urine 6/04 >> E coli   Lines:     Antibiotics:  Cefepime 6/04  Meropenem 6/04 >> Vancomycin 6/04 >>   Consults:      Interim history:  She was diagnosed with Wegener's about 15 yrs ago.  She was having recurrent sinus and lower respiratory infections that wouldn't respond to therapy.  She ultimately was seen by ENT and had nasal biopsy showing vasculitis.  She has been seen previously at [facility], [facility] clinic and [place].  More recently has been followed by specialists in  [place].  She had pneumonia in December.  She gets better with therapy in the hospital with antibiotics.  About 2 weeks after being discharged she gets recurrent symptoms of pneumonia.  She also gets episodes of lethargy, and this is associated with headache and twitching. I suspect she may be aspirating during the night due to sedation with narcotics  Vital signs:  BP (!) 159/73 (BP Location: Right Arm)   Pulse 66   Temp 99.9 F (37.7 C) (Oral)   Resp 19   Ht _0  (1.626 m)   Wt 91.4 kg   SpO2 94%   BMI 34.59 kg/m   Intake/output:  I/O last 3 completed shifts: In: 1480.2 [P.O.:720; I.V.:760.2] Out: 3 [Urine:2; Stool:1]   Physical exam:  General: Obese female who is somewhat strange affect HEENT: MM pink/moist voice is very hoarse Neuro: Follows commands but difficult to understand due to her CV: Heart sounds regular PULM: Coarse rhonchi bilaterally   GI: soft, bsx4 active  GU: Extremities: Bilateral transmetatarsal amputations well-healed   Skin: no rashes or lesions    Best practice:   DVT - heparin gtt SUP - protonix Nutrition - carb modified    Discussion:  She has 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  She has recurrent episodes of pneumonia since December 2022.  She had bronchoscopy in March 2023 with sputum AFB growing Mycobacterium gordonae.  It is uncertain whether this is causing infection that gets partially treated  with course of antibiotics while in hospital, or colonization.  She also had MRSA in sputum culture from May 2023.  She also has sleep disordered breathing and is on chronic opiate medication.  She has episodes of lethargy with possible myoclonus which could be seen with hypercapnia.    Assessment/plan:   Recurrent pneumonia. Continue current antimicrobial therapy Consider infectious disease consult Questionable need for bronchoscopy for BAL with repeat  AFB culture Note she is having some sputum production chunks of blood tinged   Pulmonary embolism. Is related obese relatively small most likely impacting respiratory status Continue heparin for now    Sleep disordered breathing. Consider ABG on 03/30/2022 in a.m.  Granulomatosis with polyangiitis. Continue prednisone and Bactrim Methotrexate on hold Questionable set up with new rheumatologist and pulmonary team    Chronic pain with chronic opiate medication use. CKD 3a. Chemotherapy induced neuropathy. Per primary team  Transferred to [facility] 03/28/2022 for infectious disease formal consult  Resolved hospital problems:    Goals of care/Family discussions:  Code status: full    Labs:      Latest Ref Rng & Units 03/29/2022    6:22 AM 03/28/2022    4:02 AM 03/27/2022    5:35 AM  CMP  Glucose 70 - 99 mg/dL 124   148   118    BUN 6 - 20 mg/dL _0 Creatinine 0.44 - 1.00 mg/dL 0.81   0.69   0.86    Sodium 135 - 145 mmol/L 140   141   143    Potassium 3.5 - 5.1 mmol/L 3.6   3.1   3.8    Chloride 98 - 111 mmol/L 110   121   122    CO2 22 - 32 mmol/L _1 Calcium 8.9 - 10.3 mg/dL 8.0   7.3   7.2    Total Protein 6.5 - 8.1 g/dL 5.0    4.7    Total Bilirubin 0.3 - 1.2 mg/dL 0.5    0.6    Alkaline Phos 38 - 126 U/L 69    67    AST 15 - 41 U/L 19    23    ALT 0 - 44 U/L 17    17         Latest Ref Rng & Units 03/29/2022    6:22 AM 03/29/2022    1:42 AM 03/28/2022    4:02 AM  CBC  WBC 4.0 - 10.5 K/uL 8.9   11.0   13.8    Hemoglobin 12.0 - 15.0 g/dL 8.2   9.2   7.8    Hematocrit 36.0 - 46.0 % 28.1   31.1   26.6    Platelets 150 - 400 K/uL 246   272   228      ABG    Component Value Date/Time   HCO3 21.6 03/26/2022 1027   ACIDBASEDEF 4.8 (H) 03/26/2022 1027   O2SAT 68.5 03/26/2022 1027    CBG (last 3)  Recent Labs    03/28/22 1604 03/28/22 2012 03/29/22 0800  GLUCAP 262* 210* 112*     Past surgical history:  She  has a past  surgical history that includes Cholecystectomy; Appendectomy; Vaginal hysterectomy; splenic aneurysm; Hernia repair (2000); Septoplasty (02/2013); Colonoscopy (N/A, 06/02/2013); Back surgery; spleen anuyism; Incision and drainage abscess (Left, 07/07/2016); Amputation toe (Bilateral, 08/12/2020); Transmetatarsal amputation (Bilateral, 10/21/2020); Amputation (Right, 10/21/2020); Transmetatarsal  amputation (Left, 03/24/2021); and Amputation toe (Right, 03/24/2021).  Social history:  She  reports that she has never smoked. She has never used smokeless tobacco. She reports current alcohol use. She reports that she does not use drugs.   Review of systems:  Reviewed and negative  Family history:  Her family history includes Diabetes in her father and mother; Heart disease in her mother.    Medications:   No current facility-administered medications on file prior to encounter.   Current Outpatient Medications on File Prior to Encounter  Medication Sig   acetaminophen (TYLENOL) 325 MG tablet Take 2 tablets (650 mg total) by mouth every 6 (six) hours as needed for mild pain (or Fever >/= 101).   albuterol (PROVENTIL HFA;VENTOLIN HFA) 108 (90 Base) MCG/ACT inhaler Inhale 2 puffs into the lungs every 6 (six) hours as needed for wheezing or shortness of breath.   amitriptyline (ELAVIL) 50 MG tablet Take 50 mg by mouth at bedtime.   Calcium Carb-Cholecalciferol (CALCIUM CARBONATE-VITAMIN D3 PO) Take 1 tablet by mouth daily.   DULoxetine (CYMBALTA) 60 MG capsule Take 60 mg by mouth daily.   folic acid (FOLVITE) 1 MG tablet Take 2 mg by mouth daily.   gabapentin (NEURONTIN) 300 MG capsule Take 600-1,200 mg by mouth See admin instructions. Take 3 (900 mg total) capsules by mouth in the morning, 2 (600 mg) capsule in the afternoon and 4 (1200 mg) capsules in the evening.   lidocaine (LIDODERM) 5 % Place 1 patch onto the skin daily as needed (for pain). Remove & Discard patch within 12 hours or as directed by MD    linaclotide (LINZESS) 145 MCG CAPS capsule Take 145 mcg by mouth daily before breakfast.   Melatonin 10 MG TABS Take 10 mg by mouth at bedtime as needed (sleep).   metFORMIN (GLUCOPHAGE-XR) 500 MG 24 hr tablet Take 500 mg by mouth daily with breakfast.   methocarbamol (ROBAXIN) 500 MG tablet Take 500 mg by mouth every 6 (six) hours as needed for muscle spasms.   METHOTREXATE SODIUM IJ Inject 25 mg into the skin every Sunday.   naloxone (NARCAN) nasal spray 4 mg/0.1 mL Place 0.4 mg into the nose once.   nystatin (MYCOSTATIN) 100000 UNIT/ML suspension Use as directed 5 mLs in the mouth or throat 4 (four) times daily.   pantoprazole (PROTONIX) 40 MG tablet Take 40 mg by mouth daily.   Potassium Chloride Crys ER (K-DUR PO) Take 40 mEq by mouth every other day.   predniSONE (DELTASONE) 10 MG tablet Take 4 tabs (53m) daily for 2 days, then reduce to your home dose of 568mdaily (Patient taking differently: Take 5 mg by mouth daily with breakfast.)   rosuvastatin (CRESTOR) 10 MG tablet Take 10 mg by mouth at bedtime.   sulfamethoxazole-trimethoprim (BACTRIM) 400-80 MG tablet Take 1 tablet by mouth 3 (three) times a week. Monday,Wednesday and Friday   tapentadol HCl (NUCYNTA) 75 MG tablet Take 75 mg by mouth. Take 1 tablet in the morning, 1 tablet in the afternoon and 2 tablets at bedtime   topiramate (TOPAMAX) 100 MG tablet Take 100 mg by mouth daily.   vitamin B-12 (CYANOCOBALAMIN) 1000 MCG tablet Take 1,000 mcg by mouth daily.   Vitamin D, Ergocalciferol, (DRISDOL) 50000 units CAPS capsule Take 50,000 Units by mouth every Friday.   oxyCODONE 10 MG TABS Take 1 tablet (10 mg total) by mouth every 4 (four) hours as needed for severe pain or breakthrough pain. (Patient not taking: Reported on  03/26/2022)   senna (SENOKOT) 8.6 MG TABS tablet Take 2 tablets (17.2 mg total) by mouth 2 (two) times daily. (Patient not taking: Reported on 03/26/2022)     Signature:  Richardson Landry Rini Moffit ACNP Acute Care Nurse  Practitioner [place] Please consult Amion 03/29/2022, 8:52 AM

## 2022-03-29 NOTE — Progress Notes (Signed)
RT instruc6ted patient on the use of a flutter valve. Patient is very sleepy but did follow instructions.

## 2022-03-29 NOTE — Progress Notes (Signed)
Physical Therapy Treatment Patient Details Name: [name] MRN: [id number] DOB: 11/09/1965 Today's Date: 03/29/2022   History of Present Illness Pt is a 56 yo female presenting with AMS and was admitted on 03/26/22 with severe sepsis secondary to a UTI and pneumonia. Diagnosed with an acute PE on 6/5. PMH includes Wegener's disease, stage 3 CKD, DM, HTN, chronic pain with an intrathecal pump, OSA, and bilateral mid-foot amputations with peripheral neuropathy. Hospitalized 4/3-5/4 for myositis.    PT Comments    Patient progressing well towards PT goals. Session focused on exercise and gait progression. Pt reports pain in bil feet from her neuropathy due to not having the right medication here in the hospital. Tolerated there ex sitting EOB, not able to tolerate standing exercise due to reports of pulling a muscle in her right hamstring and glute muscle recently. Noted to have 2/4 DOE with activity but VSS on RA. Encouraged increasing activity while in the hospital. Discharge recommendation updated to no follow up PT needed at home as pt seems to be functioning close to baseline. Will continue to follow to prevent further deconditioning and weakness and improve overall baseline balance deficits.    Recommendations for follow up therapy are one component of a multi-disciplinary discharge planning process, led by the attending physician.  Recommendations may be updated based on patient status, additional functional criteria and insurance authorization.  Follow Up Recommendations  No PT follow up     Assistance Recommended at Discharge PRN  Patient can return home with the following A little help with walking and/or transfers;Help with stairs or ramp for entrance;A little help with bathing/dressing/bathroom;Assist for transportation   Equipment Recommendations  None recommended by PT    Recommendations for Other Services       Precautions / Restrictions Precautions Precautions:  Fall Restrictions Weight Bearing Restrictions: No     Mobility  Bed Mobility Overal bed mobility: Modified Independent             General bed mobility comments: No assist needed, increased time.    Transfers Overall transfer level: Needs assistance Equipment used: None Transfers: Sit to/from Stand Sit to Stand: Supervision           General transfer comment: Supervision for safety. Stood from EOB x4.    Ambulation/Gait Ambulation/Gait assistance: Min guard, Min assist Gait Distance (Feet): 80 Feet Assistive device: None Gait Pattern/deviations: Decreased step length - left, Decreased step length - right, Decreased stride length, Wide base of support Gait velocity: decreased     General Gait Details: Slow, mostly steady gait with a few instances of mild instability but no overt LOB. Shortened step lengths bilaterally. 2/4 DOE noted. VSS on RA.   Stairs             Wheelchair Mobility    Modified Rankin (Stroke Patients Only)       Balance Overall balance assessment: Needs assistance Sitting-balance support: Feet supported, No upper extremity supported Sitting balance-Leahy Scale: Good     Standing balance support: During functional activity Standing balance-Leahy Scale: Fair Standing balance comment: Patient able to balance with no AD but demonstrates minor balance deficits secondary to B midfoot amputation.                            Cognition Arousal/Alertness: Awake/alert Behavior During Therapy: WFL for tasks assessed/performed Overall Cognitive Status: Within Functional Limits for tasks assessed  Exercises General Exercises - Lower Extremity Ankle Circles/Pumps: AROM, Both, 10 reps, Supine Quad Sets: AROM, Both, 5 reps, Supine Long Arc Quad: Strengthening, Both, 10 reps, Seated Hip Flexion/Marching: Strengthening, Both, 10 reps, Seated    General Comments General  comments (skin integrity, edema, etc.): VSS on RA.      Pertinent Vitals/Pain Pain Assessment Pain Assessment: Faces Faces Pain Scale: Hurts even more Pain Location: Bil feet, back of Rt knee Pain Descriptors / Indicators: Shooting, Burning, Sore Pain Intervention(s): Monitored during session, Repositioned    Home Living                          Prior Function            PT Goals (current goals can now be found in the care plan section) Progress towards PT goals: Progressing toward goals    Frequency    Min 3X/week      PT Plan Discharge plan needs to be updated    Co-evaluation              AM-PAC PT "6 Clicks" Mobility   Outcome Measure  Help needed turning from your back to your side while in a flat bed without using bedrails?: None Help needed moving from lying on your back to sitting on the side of a flat bed without using bedrails?: None Help needed moving to and from a bed to a chair (including a wheelchair)?: A Little Help needed standing up from a chair using your arms (e.g., wheelchair or bedside chair)?: A Little Help needed to walk in hospital room?: A Little Help needed climbing 3-5 steps with a railing? : A Little 6 Click Score: 20    End of Session Equipment Utilized During Treatment: Gait belt Activity Tolerance: Patient tolerated treatment well Patient left: in bed;with call bell/phone within reach;with bed alarm set Nurse Communication: Mobility status PT Visit Diagnosis: Unsteadiness on feet (R26.81);Other abnormalities of gait and mobility (R26.89);Muscle weakness (generalized) (M62.81)     Time: [id number] PT Time Calculation (min) (ACUTE ONLY): 18 min  Charges:  $Therapeutic Activity: 8-22 mins                     Marisa Severin, PT, DPT Acute Rehabilitation Services Secure chat preferred Office [place] 03/29/2022, 5:03 PM

## 2022-03-29 NOTE — Plan of Care (Signed)
  Problem: Education: Goal: Knowledge of General Education information will improve Description: Including pain rating scale, medication(s)/side effects and non-pharmacologic comfort measures Outcome: Progressing   Problem: Clinical Measurements: Goal: Ability to maintain clinical measurements within normal limits will improve Outcome: Progressing   Problem: Clinical Measurements: Goal: Respiratory complications will improve Outcome: Progressing   Problem: Coping: Goal: Level of anxiety will decrease Outcome: Progressing   Problem: Elimination: Goal: Will not experience complications related to bowel motility Outcome: Progressing   Problem: Pain Managment: Goal: General experience of comfort will improve Outcome: Progressing   Problem: Skin Integrity: Goal: Risk for impaired skin integrity will decrease Outcome: Progressing

## 2022-03-29 NOTE — Progress Notes (Signed)
Pharmacy Antibiotic Note  [name] is a 56 y.o. female admitted on 03/26/2022 with pneumonia.  Pharmacy has been consulted for vancomycin dosing.  Plan: Adjusting dose based on renal function, weight, and AUC dosing. Vancomycin 1250 mg IV Q 24 hrs. Goal AUC 400-550. Expected AUC: 456 SCr used: 0.81 Monitor clinical progress, cultures/sensitivities, renal function, abx plan, Vancomycin levels as indicated   Height: 5\' 4"  (162.6 cm) Weight: 91.4 kg (201 lb 8 oz) IBW/kg (Calculated) : 54.7  Temp (24hrs), Avg:98.1 F (36.7 C), Min:97.6 F (36.4 C), Max:99.4 F (37.4 C)  Recent Labs  Lab 03/26/22 1007 03/26/22 1156 03/27/22 0535 03/28/22 0402 03/29/22 0142 03/29/22 0622  WBC 13.3*  --  17.0* 13.8* 11.0* 8.9  CREATININE 1.11*  --  0.86 0.69  --  0.81  LATICACIDVEN 2.0* 1.3  --   --   --   --     Estimated Creatinine Clearance: 85 mL/min (by C-G formula based on SCr of 0.81 mg/dL).    Allergies  Allergen Reactions   Ibuprofen Other (See Comments)    Pt is unable to take this due to kidney problems.      Penicillins Rash and Other (See Comments)    Has patient had a PCN reaction causing immediate rash, facial/tongue/throat swelling, SOB or lightheadedness with hypotension: No Has patient had a PCN reaction causing severe rash involving mucus membranes or skin necrosis: No Has patient had a PCN reaction that required hospitalization No Has patient had a PCN reaction occurring within the last 10 years: No If all of the above answers are "NO", then may proceed with Cephalosporin use.    Antimicrobials this admission: PTA bactrim TIW 6/4 cefepime x 1 6/4 vanc >>  6/4 meropenem >>   Dose adjustments this admission: Vancomycin: 6/7 adjusted based on AUC dosing 6/5 adjusted from 1250 q12 to 1750 q24  Microbiology results: 6/4 BCx: ng x 3d 6/4 UCx: E.coli 40,000 col/mL  6/4 COV/Flu: megative  6/7 MRSA PCR: negative   Thank you for allowing 8/7 to participate in this  patients care. [place], PharmD 03/29/2022 9:52 AM  **Pharmacist phone directory can be found on [email or web address] listed under [facility]**

## 2022-03-29 NOTE — Evaluation (Signed)
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  The patient is unable to provide any history at this time secondary to her encephalopathy.  History is obtained from review of the medical record and speaking with the patient's spouse at the bedside.  According to the patient's spouse, the patient normally has episodes of "on and off confusion" for at least the past year.  He states that the patient [place] [facility].  Apparently, she had an episode of poor responsiveness after which she was back to her usual baseline.  He states that this has been normal for her for many months.  However, he has noted at the same time that she has been a bit more confused in the past week.  However when she woke up on the morning of 03/26/2022 the patient was more confused and agitated than usual.  The spouse took the patient's temperature with a transcutaneous thermometer, and it was 104.0 F at home.  He states that she has not been on any new medications.  There is been no history of vomiting, diarrhea, worsening shortness of breath.  However she has a chronic cough.  She did have a small amount of hemoptysis on 03/22/2022.  The patient has chronic pain and has an intrathecal pump infusing bupivacaine/ziconitide which was recently [facility].  He states that the settings have not been changed.  He also states that the patient does not take more opioid medications than prescribed.  There is been no history of headaches, tonic-clonic activity, or syncope.  Notably, the patient was recently  hospitalized from 02/19/2022 to 02/23/2022 secondary to myositis of the gluteus medius.  She was discharged home with clindamycin and a steroid taper.  Spouse states that her hip pain has gradually improved since discharge from the hospital, and she is able to ambulate.   Clinical Impression   Patient admitted with the above diagnosis.  She is very close to her baseline.  Mild LOB noted, and she would reach out for objects in her environment.  At home, her spouse completes all iADL, but the patient will heat items up in the microwave.  She typically just walks short distances at home, but does use a scooter depending on neuropathic foot pain.  She having no confusion, and feels back to herself.  Initially she will have 24 hour assist as needed from family and friends.  No further OT needs in the acute setting, and no post acute OT is anticipated.  The patient just completed HH OT and PT, and does not believe she will need additional OT at home.         Recommendations for follow up therapy are one component of a multi-disciplinary discharge planning process, led by the attending physician.  Recommendations may be updated based on patient status, additional functional criteria and insurance authorization.   Follow Up Recommendations  No OT follow up    Assistance Recommended at Discharge Intermittent Supervision/Assistance  Patient can return home with the following      Functional Status Assessment  Patient has not had a recent decline in their functional status  Equipment Recommendations  None recommended  by OT    Recommendations for Other Services       Precautions / Restrictions Precautions Precautions: Fall Restrictions Weight Bearing Restrictions: No      Mobility Bed Mobility Overal bed mobility: Modified Independent                  Transfers Overall transfer level: Needs assistance Equipment used: None Transfers: Sit to/from Stand, Bed to chair/wheelchair/BSC Sit  to Stand: Supervision     Step pivot transfers: Supervision     General transfer comment: mild LOB noted, but able to recover      Balance   Sitting-balance support: Bilateral upper extremity supported, Feet supported Sitting balance-Leahy Scale: Normal     Standing balance support: No upper extremity supported Standing balance-Leahy Scale: Fair                             ADL either performed or assessed with clinical judgement   ADL Overall ADL's : At baseline                                             Vision Baseline Vision/History: 1 Wears glasses Patient Visual Report: No change from baseline       Perception Perception Perception: Within Functional Limits   Praxis Praxis Praxis: Intact    Pertinent Vitals/Pain Pain Assessment Pain Assessment: 0-10 Pain Score: 5  Pain Location: B feet, back of R knee Pain Descriptors / Indicators: Shooting, Burning Pain Intervention(s): Monitored during session     Hand Dominance Left   Extremity/Trunk Assessment Upper Extremity Assessment Upper Extremity Assessment: Overall WFL for tasks assessed   Lower Extremity Assessment Lower Extremity Assessment: Defer to PT evaluation   Cervical / Trunk Assessment Cervical / Trunk Assessment: Normal   Communication Communication Communication: No difficulties   Cognition Arousal/Alertness: Awake/alert Behavior During Therapy: WFL for tasks assessed/performed Overall Cognitive Status: Within Functional Limits for tasks assessed                                       General Comments   VSS on RA    Exercises     Shoulder Instructions      Home Living Family/patient expects to be discharged to:: Private residence Living Arrangements: Spouse/significant other Available Help at Discharge: Family;Available PRN/intermittently Type of Home: House Home Access: Stairs to enter [employer] of Steps: 2 Entrance  Stairs-Rails: None Home Layout: One level     Bathroom Shower/Tub: Tub/shower unit;Walk-in shower   Bathroom Toilet: Standard Bathroom Accessibility: Yes How Accessible: Accessible via walker Home Equipment: Insurance risk surveyor (2 wheels);BSC/3in1;Cane - single point;Tub bench;Grab bars - tub/shower          Prior Functioning/Environment Prior Level of Function : Needs assist             Mobility Comments: Patient is able to ambulate short distances at home with use of RW and cane but primarily uses scooter for longer distances and is the main AD used. Patient is a limited community ambulator with use of scooter. Patient is not driving. ADLs Comments: Assisted for bathing and dressing. Pt able to complete toileting without assist. Grooming and feeding are independent. Assisted for IADL's by husband.  OT Problem List: Impaired balance (sitting and/or standing);Pain      OT Treatment/Interventions:      OT Goals(Current goals can be found in the care plan section) Acute Rehab OT Goals Patient Stated Goal: hopefully home tomorrow OT Goal Formulation: With patient Time For Goal Achievement: 04/03/22 Potential to Achieve Goals: Good  OT Frequency:      Co-evaluation              AM-PAC OT "6 Clicks" Daily Activity     Outcome Measure Help from another person eating meals?: None Help from another person taking care of personal grooming?: None Help from another person toileting, which includes using toliet, bedpan, or urinal?: A Little Help from another person bathing (including washing, rinsing, drying)?: A Little Help from another person to put on and taking off regular upper body clothing?: None Help from another person to put on and taking off regular lower body clothing?: A Little 6 Click Score: 21   End of Session Nurse Communication: Mobility status  Activity Tolerance: Patient tolerated treatment well Patient left: in bed;with  family/visitor present;with call bell/phone within reach  OT Visit Diagnosis: Unsteadiness on feet (R26.81);Pain Pain - Right/Left: Left Pain - part of body: Ankle and joints of foot                Time: 9381-8299 OT Time Calculation (min): 21 min Charges:  OT General Charges $OT Visit: 1 Visit OT Evaluation $OT Eval Moderate Complexity: 1 Mod  03/29/2022  RP, OTR/L  Acute Rehabilitation Services  Office:  [phone]   Suzanna Obey 03/29/2022, 12:40 PM

## 2022-03-29 NOTE — Progress Notes (Signed)
ANTICOAGULATION CONSULT NOTE  Pharmacy Consult for heparin Indication: pulmonary embolus  Allergies  Allergen Reactions   Ibuprofen Other (See Comments)    Pt is unable to take this due to kidney problems.      Penicillins Rash and Other (See Comments)    Has patient had a PCN reaction causing immediate rash, facial/tongue/throat swelling, SOB or lightheadedness with hypotension: No Has patient had a PCN reaction causing severe rash involving mucus membranes or skin necrosis: No Has patient had a PCN reaction that required hospitalization No Has patient had a PCN reaction occurring within the last 10 years: No If all of the above answers are "NO", then may proceed with Cephalosporin use.    Patient Measurements: Height: 5\' 4"  (162.6 cm) Weight: 91.4 kg (201 lb 8 oz) IBW/kg (Calculated) : 54.7 Heparin Dosing Weight: 76 kg  Vital Signs: Temp: 97.9 F (36.6 C) (06/06 2319) Temp Source: Oral (06/06 2319) BP: 145/89 (06/06 2319) Pulse Rate: 76 (06/06 2319)  Labs: Recent Labs    03/26/22 1007 03/27/22 0535 03/27/22 2222 03/28/22 0402 03/28/22 0753 03/28/22 1317 03/28/22 1545 03/28/22 2218  HGB 9.8* 8.5*  --  7.8*  --   --   --   --   HCT 33.4* 29.4*  --  26.6*  --   --   --   --   PLT 247 210  --  228  --   --   --   --   APTT 28  --   --   --   --   --   --   --   LABPROT 14.1  --   --   --   --   --   --   --   INR 1.1  --   --   --   --   --   --   --   HEPARINUNFRC  --   --    < >  --  0.94*  --  0.63 0.62  CREATININE 1.11* 0.86  --  0.69  --   --   --   --   TROPONINIHS  --   --   --   --  8 10  --   --    < > = values in this interval not displayed.     Estimated Creatinine Clearance: 86 mL/min (by C-G formula based on SCr of 0.69 mg/dL).   Medical History: Past Medical History:  Diagnosis Date   Chronic cough    Chronic pain    Diabetes mellitus without complication (HCC)    Dyspnea    GERD (gastroesophageal reflux disease)    Hypokalemia     Hypothyroidism    Left wrist pain 06/01/2021   Myonecrosis (HCC) 07/01/2021   Neurocardiogenic syncope    OSA (obstructive sleep apnea)    does not use CPAP   Osteomyelitis (HCC)    bilateral feet   Peripheral neuropathy    Presence of intrathecal pump    recieves Prialt/bupivicaine   Tear of gluteus medius tendon 07/01/2021   Wegener's granulomatosis 2009   Wegner's disease (congenital syphilitic osteochondritis)     Medications:  Medications Prior to Admission  Medication Sig Dispense Refill Last Dose   acetaminophen (TYLENOL) 325 MG tablet Take 2 tablets (650 mg total) by mouth every 6 (six) hours as needed for mild pain (or Fever >/= 101).   unknown   albuterol (PROVENTIL HFA;VENTOLIN HFA) 108 (90 Base) MCG/ACT inhaler Inhale 2 puffs  into the lungs every 6 (six) hours as needed for wheezing or shortness of breath.   unknown   amitriptyline (ELAVIL) 50 MG tablet Take 50 mg by mouth at bedtime.   03/25/2022   Calcium Carb-Cholecalciferol (CALCIUM CARBONATE-VITAMIN D3 PO) Take 1 tablet by mouth daily.   unknown   DULoxetine (CYMBALTA) 60 MG capsule Take 60 mg by mouth daily.   03/26/2022   folic acid (FOLVITE) 1 MG tablet Take 2 mg by mouth daily.   unknown   gabapentin (NEURONTIN) 300 MG capsule Take 600-1,200 mg by mouth See admin instructions. Take 3 (900 mg total) capsules by mouth in the morning, 2 (600 mg) capsule in the afternoon and 4 (1200 mg) capsules in the evening.   03/26/2022   lidocaine (LIDODERM) 5 % Place 1 patch onto the skin daily as needed (for pain). Remove & Discard patch within 12 hours or as directed by MD   03/25/2022   linaclotide (LINZESS) 145 MCG CAPS capsule Take 145 mcg by mouth daily before breakfast.   03/26/2022   Melatonin 10 MG TABS Take 10 mg by mouth at bedtime as needed (sleep).   03/25/2022   metFORMIN (GLUCOPHAGE-XR) 500 MG 24 hr tablet Take 500 mg by mouth daily with breakfast.   03/26/2022   methocarbamol (ROBAXIN) 500 MG tablet Take 500 mg by mouth every 6 (six)  hours as needed for muscle spasms.   03/25/2022   METHOTREXATE SODIUM IJ Inject 25 mg into the skin every Sunday.   03/19/2022   naloxone (NARCAN) nasal spray 4 mg/0.1 mL Place 0.4 mg into the nose once.   unknown   nystatin (MYCOSTATIN) 100000 UNIT/ML suspension Use as directed 5 mLs in the mouth or throat 4 (four) times daily.   Past Week   pantoprazole (PROTONIX) 40 MG tablet Take 40 mg by mouth daily.   03/26/2022   Potassium Chloride Crys ER (K-DUR PO) Take 40 mEq by mouth every other day.   unknown   predniSONE (DELTASONE) 10 MG tablet Take 4 tabs (40mg ) daily for 2 days, then reduce to your home dose of 5mg  daily (Patient taking differently: Take 5 mg by mouth daily with breakfast.) 30 tablet 0 03/26/2022   rosuvastatin (CRESTOR) 10 MG tablet Take 10 mg by mouth at bedtime.   03/25/2022   sulfamethoxazole-trimethoprim (BACTRIM) 400-80 MG tablet Take 1 tablet by mouth 3 (three) times a week. Monday,Wednesday and Friday   03/24/2022   tapentadol HCl (NUCYNTA) 75 MG tablet Take 75 mg by mouth. Take 1 tablet in the morning, 1 tablet in the afternoon and 2 tablets at bedtime   03/26/2022   topiramate (TOPAMAX) 100 MG tablet Take 100 mg by mouth daily.   03/26/2022   vitamin B-12 (CYANOCOBALAMIN) 1000 MCG tablet Take 1,000 mcg by mouth daily.   unknown   Vitamin D, Ergocalciferol, (DRISDOL) 50000 units CAPS capsule Take 50,000 Units by mouth every Friday.   03/24/2022   oxyCODONE 10 MG TABS Take 1 tablet (10 mg total) by mouth every 4 (four) hours as needed for severe pain or breakthrough pain. (Patient not taking: Reported on 03/26/2022) 30 tablet 0 Not Taking   senna (SENOKOT) 8.6 MG TABS tablet Take 2 tablets (17.2 mg total) by mouth 2 (two) times daily. (Patient not taking: Reported on 03/26/2022) 30 tablet 0 Not Taking    Assessment: Pharmacy consulted to dose heparin in patient with pulmonary embolism, confirmed by CT.  Patient is not on anticoagulation prior to admission but received prophylaxis  lovenox 6/5  1419.  6/7 AM update:  Heparin level therapeutic x 2  Goal of Therapy:  Heparin level 0.3-0.7 units/ml Monitor platelets by anticoagulation protocol: Yes   Plan:  Continue heparin infusion at 900 units/hr. Daily CBC and heparin level  Abran DukeJames Tatelyn Vanhecke, PharmD, BCPS Clinical Pharmacist Phone: [phone]

## 2022-03-29 NOTE — Progress Notes (Signed)
[facility] requesting Nucynta 75 mg po quick release.

## 2022-03-29 NOTE — Progress Notes (Signed)
[facility] for heparin Indication: pulmonary embolus  Allergies  Allergen Reactions   Ibuprofen Other (See Comments)    Pt is unable to take this due to kidney problems.      Penicillins Rash and Other (See Comments)    Has patient had a PCN reaction causing immediate rash, facial/tongue/throat swelling, SOB or lightheadedness with hypotension: No Has patient had a PCN reaction causing severe rash involving mucus membranes or skin necrosis: No Has patient had a PCN reaction that required hospitalization No Has patient had a PCN reaction occurring within the last 10 years: No If all of the above answers are "NO", then may proceed with Cephalosporin use.    Patient Measurements: Height: 5\' 4"  (162.6 cm) Weight: 91.4 kg (201 lb 8 oz) IBW/kg (Calculated) : 54.7 Heparin Dosing Weight: 76 kg  Vital Signs: Temp: 99.9 F (37.7 C) (06/07 0803) Temp Source: Oral (06/07 0803) BP: 159/73 (06/07 0803) Pulse Rate: 66 (06/07 0803)  Labs: Recent Labs    03/26/22 1007 03/27/22 0535 03/27/22 2222 03/28/22 0402 03/28/22 0753 03/28/22 1317 03/28/22 1545 03/28/22 2218 03/29/22 0142 03/29/22 0622  HGB 9.8* 8.5*  --  7.8*  --   --   --   --  9.2* 8.2*  HCT 33.4* 29.4*  --  26.6*  --   --   --   --  31.1* 28.1*  PLT 247 210  --  228  --   --   --   --  272 246  APTT 28  --   --   --   --   --   --   --   --   --   LABPROT 14.1  --   --   --   --   --   --   --   --   --   INR 1.1  --   --   --   --   --   --   --   --   --   HEPARINUNFRC  --   --    < >  --  0.94*  --  0.63 0.62 0.40  --   CREATININE 1.11* 0.86  --  0.69  --   --   --   --   --  0.81  TROPONINIHS  --   --   --   --  8 10  --   --   --   --    < > = values in this interval not displayed.     Estimated Creatinine Clearance: 85 mL/min (by C-G formula based on SCr of 0.81 mg/dL).   Medical History: Past Medical History:  Diagnosis Date   Chronic cough    Chronic pain    Diabetes  mellitus without complication (HCC)    Dyspnea    GERD (gastroesophageal reflux disease)    Hypokalemia    Hypothyroidism    Left wrist pain 06/01/2021   Myonecrosis ([place]) 07/01/2021   Neurocardiogenic syncope    OSA (obstructive sleep apnea)    does not use CPAP   Osteomyelitis (HCC)    bilateral feet   Peripheral neuropathy    Presence of intrathecal pump    recieves Prialt/bupivicaine   Tear of gluteus medius tendon 07/01/2021   Wegener's granulomatosis 2009   Wegner's disease (congenital syphilitic osteochondritis)     Medications:  Medications Prior to Admission  Medication Sig Dispense Refill Last Dose   acetaminophen (  TYLENOL) 325 MG tablet Take 2 tablets (650 mg total) by mouth every 6 (six) hours as needed for mild pain (or Fever >/= 101).   unknown   albuterol (PROVENTIL HFA;VENTOLIN HFA) 108 (90 Base) MCG/ACT inhaler Inhale 2 puffs into the lungs every 6 (six) hours as needed for wheezing or shortness of breath.   unknown   amitriptyline (ELAVIL) 50 MG tablet Take 50 mg by mouth at bedtime.   03/25/2022   Calcium Carb-Cholecalciferol (CALCIUM CARBONATE-VITAMIN D3 PO) Take 1 tablet by mouth daily.   unknown   DULoxetine (CYMBALTA) 60 MG capsule Take 60 mg by mouth daily.   [id number]   folic acid (FOLVITE) 1 MG tablet Take 2 mg by mouth daily.   unknown   gabapentin (NEURONTIN) 300 MG capsule Take 600-1,200 mg by mouth See admin instructions. Take 3 (900 mg total) capsules by mouth in the morning, 2 (600 mg) capsule in the afternoon and 4 (1200 mg) capsules in the evening.   03/26/2022   lidocaine (LIDODERM) 5 % Place 1 patch onto the skin daily as needed (for pain). Remove & Discard patch within 12 hours or as directed by MD   03/25/2022   linaclotide (LINZESS) 145 MCG CAPS capsule Take 145 mcg by mouth daily before breakfast.   03/26/2022   Melatonin 10 MG TABS Take 10 mg by mouth at bedtime as needed (sleep).   03/25/2022   metFORMIN (GLUCOPHAGE-XR) 500 MG 24 hr tablet Take 500 mg by  mouth daily with breakfast.   03/26/2022   methocarbamol (ROBAXIN) 500 MG tablet Take 500 mg by mouth every 6 (six) hours as needed for muscle spasms.   03/25/2022   METHOTREXATE SODIUM IJ Inject 25 mg into the skin every Sunday.   03/19/2022   naloxone (NARCAN) nasal spray 4 mg/0.1 mL Place 0.4 mg into the nose once.   unknown   nystatin (MYCOSTATIN) 100000 UNIT/ML suspension Use as directed 5 mLs in the mouth or throat 4 (four) times daily.   Past Week   pantoprazole (PROTONIX) 40 MG tablet Take 40 mg by mouth daily.   03/26/2022   Potassium Chloride Crys ER (K-DUR PO) Take 40 mEq by mouth every other day.   unknown   predniSONE (DELTASONE) 10 MG tablet Take 4 tabs (40mg ) daily for 2 days, then reduce to your home dose of 5mg  daily (Patient taking differently: Take 5 mg by mouth daily with breakfast.) 30 tablet 0 03/26/2022   rosuvastatin (CRESTOR) 10 MG tablet Take 10 mg by mouth at bedtime.   03/25/2022   sulfamethoxazole-trimethoprim (BACTRIM) 400-80 MG tablet Take 1 tablet by mouth 3 (three) times a week. Monday,Wednesday and Friday   03/24/2022   tapentadol HCl (NUCYNTA) 75 MG tablet Take 75 mg by mouth. Take 1 tablet in the morning, 1 tablet in the afternoon and 2 tablets at bedtime   03/26/2022   topiramate (TOPAMAX) 100 MG tablet Take 100 mg by mouth daily.   03/26/2022   vitamin B-12 (CYANOCOBALAMIN) 1000 MCG tablet Take 1,000 mcg by mouth daily.   unknown   Vitamin D, Ergocalciferol, (DRISDOL) 50000 units CAPS capsule Take 50,000 Units by mouth every Friday.   03/24/2022   oxyCODONE 10 MG TABS Take 1 tablet (10 mg total) by mouth every 4 (four) hours as needed for severe pain or breakthrough pain. (Patient not taking: Reported on 03/26/2022) 30 tablet 0 Not Taking   senna (SENOKOT) 8.6 MG TABS tablet Take 2 tablets (17.2 mg total) by mouth 2 (two)  times daily. (Patient not taking: Reported on 03/26/2022) 30 tablet 0 Not Taking    Assessment: Pharmacy consulted to dose heparin in patient with pulmonary  embolism, confirmed by CT.  Patient is not on anticoagulation prior to admission. Last dose lovenox 40 mg on 6/5 1419.  Heparin level remains therapeutic at 0.4. hgb and plt stable. No new bleeding noted.  Goal of Therapy:  Heparin level 0.3-0.7 units/ml Monitor platelets by anticoagulation protocol: Yes   Plan:  Continue heparin infusion at 900 units/hr. Daily CBC and heparin level F/u transition to oral anticoagulation   Thank you for allowing [place] to participate in this patients care. Jens Som, PharmD 03/29/2022 9:33 AM  **Pharmacist phone directory can be found on [email or web address] listed under [place]**

## 2022-03-29 NOTE — Progress Notes (Addendum)
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  Diagnosed with sepsis due to pneumonia and transferred to [facility] for further care.   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  We will also have speech therapy evaluate the patient to rule out ongoing aspiration.  Monitor final cultures.  Acute pulmonary embolus (HCC) - 6/5 CTA chest--Small pulmonary emboli in RIGHT lung, stable echocardiogram with EF 55% and no RV strain.  Currently on heparin drip.  Likely can be switched to oral anticoagulation if pulmonary decides she is safe in the light of stable bronchoscopy. Will check Leg [place].  Granulomatosis with polyangiitis [facility]) - Patient follows with rheumatology at [facility], Dr. Allean Found, She is on methotrexate every Sunday, Rituximab currently on hold secondary to recent sepsis. She is chronically on prednisone 5 mg daily currently on 20 mg daily.  Monitor closely .  Chronic iron deficiency anemia.  Check anemia panel, PPI, outpatient age-appropriate work-up by PCP.  Monitor. Agreeable for transfusion if needed.  Toxic and metabolic encephalopathy.  Due to combination of #1 above and high-dose narcotics and multiple sedatives being on  board.  She has been counseled not to overuse narcotics as risk of respiratory failure and intubation is high, minimize offending medications, as needed Narcan, head CT is negative and she has no focal deficits.  Opioid dependence ([place])  - Patient has intrathecal pump with bupivacaine and ziconotide, Last refill 03/02/2022, Patient receives Nucynta 75 mg, #120; last refill 03/17/2022,  Patient is also on oxycodone 10 mg; #30, last refilled 02/23/2022.   Chemotherapy-induced peripheral neuropathy ([place]) - Patient is on gabapentin, Cymbalta, amitriptyline topiramate, trazodone, medications adjusted to reduce encephalopathy and sedation.  Presence of intrathecal pump - Patient has intrathecal pump with bupivacaine and ziconotide, Last refill 03/02/2022.  Stage 3a chronic kidney disease (HCC) Baseline creatinine 0.8-1.1, Monitor with serial BMP.  UTI (urinary tract infection) - UA > 50 WBC, culture  E.coli, continue merrem on 03/29/2022 then per ID.  Class 2 obesity- BMI 35.04, with PCP for weight loss    Family Communication:     Husband Antony Haste [phone] on 03/29/2022 at 11:11 AM  Consultants:  Pulmonary, ID  Code Status:  FULL   DVT Prophylaxis:  IV Heparin   Procedures:  Leg [place] -   CTA - Small pulmonary emboli in RIGHT lung. Small BILATERAL pleural effusions. Patchy BILATERAL pulmonary infiltrates consistent with multifocal pneumonia greatest in RIGHT upper lobe.  TTE - 1. Left ventricular ejection fraction, by estimation, is 55%. The left ventricle has normal function. The left ventricle has no regional wall motion abnormalities. Left ventricular diastolic parameters were normal.  2. Right ventricular systolic function is normal. The right ventricular size is normal. There is normal pulmonary artery systolic pressure.  3. The pericardial effusion is anterior to the right  ventricle.  4. The mitral valve is abnormal. Trivial mitral valve regurgitation. No evidence of mitral stenosis.  5. The  aortic valve is tricuspid. There is mild calcification of the aortic valve. Aortic valve regurgitation is not visualized. Aortic valve sclerosis is present, with no evidence of aortic valve stenosis.  6. The inferior vena cava is normal in size with greater than 50% respiratory variability, suggesting right atrial pressure of 3 mmHg.   Subjective:  Patient in bed, sleeping, arousable but overall sleepy, in no distress, denies any headache chest or abdominal pain, mild cough but currently not short of breath.  Objective: Vitals:   03/28/22 2319 03/29/22 0444 03/29/22 0724 03/29/22 0932  BP: (!) 145/89 126/90  (!) 131/94  Pulse:  70 (!) 55 73  Resp:  18 16 18   Temp: 97.9 F (36.6 C) 97.6 F (36.4 C)  97.8 F (36.6 C)  TempSrc: Oral Oral  Oral  SpO2:  94% 93% 96%  Weight: 91.4 kg     Height:        Intake/Output Summary (Last 24 hours) at 03/29/2022 1108 Last data filed at 03/28/2022 2042 Gross per 24 hour  Intake 682.9 ml  Output --  Net 682.9 ml   Weight change: -4.5 kg  Exam:  Somnolent but easily arousable, No new F.N deficits, Normal affect [place].AT,PERRAL Supple Neck, No JVD,   Symmetrical Chest wall movement, Good air movement bilaterally, few bibasilar crackles RRR,No Gallops, Rubs or new Murmurs,  +ve B.Sounds, Abd Soft, No tenderness,   Bilateral transmetatarsal amputations   Data Reviewed:  Recent Labs  Lab 03/26/22 1007 03/27/22 0535 03/28/22 0402 03/29/22 0142 03/29/22 0622  WBC 13.3* 17.0* 13.8* 11.0* 8.9  HGB 9.8* 8.5* 7.8* 9.2* 8.2*  HCT 33.4* 29.4* 26.6* 31.1* 28.1*  PLT 247 210 228 272 246  MCV 87.2 88.6 87.5 86.1 86.2  MCH 25.6* 25.6* 25.7* 25.5* 25.2*  MCHC 29.3* 28.9* 29.3* 29.6* 29.2*  RDW 22.2* 22.3* 22.4* 22.5* 22.5*  LYMPHSABS 2.6  --   --   --   --   MONOABS 0.8  --   --   --   --   EOSABS 0.1  --   --   --   --   BASOSABS 0.0  --   --   --   --     Recent Labs  Lab 03/26/22 1005 03/26/22 1007 03/26/22 1027 03/26/22 1156  03/27/22 0535 03/28/22 0402 03/29/22 0622  NA  --  141  --   --  143 141 140  K  --  3.6  --   --  3.8 3.1* 3.6  CL  --  114*  --   --  122* 121* 110  CO2  --  22  --   --  20* 18* 21*  GLUCOSE  --  140*  --   --  118* 148* 124*  BUN  --  12  --   --  14 11 7   CREATININE  --  1.11*  --   --  0.86 0.69 0.81  CALCIUM  --  7.9*  --   --  7.2* 7.3* 8.0*  AST  --  27  --   --  23  --  19  ALT  --  18  --   --  17  --  17  ALKPHOS  --  87  --   --  67  --  69  BILITOT  --  0.4  --   --  0.6  --  0.5  ALBUMIN  --  2.6*  --   --  2.4*  --  2.4*  MG  --   --   --   --   --  2.0 1.9  CRP  --   --   --   --   --   --  5.1*  PROCALCITON  --   --  0.32  --  0.74 0.50  --   LATICACIDVEN  --  2.0*  --  1.3  --   --   --   INR  --  1.1  --   --   --   --   --   HGBA1C  --   --  8.6*  --   --   --   --   BNP 243.0*  --   --   --   --   --   --     Scheduled Meds:  amitriptyline  50 mg Oral QHS   budesonide (PULMICORT) nebulizer solution  0.5 mg Nebulization BID   Chlorhexidine Gluconate Cloth  6 each Topical Daily   [START ON 03/30/2022] DULoxetine  30 mg Oral Daily   folic acid  2 mg Oral Daily   gabapentin  300 mg Oral TID   insulin aspart  0-9 Units Subcutaneous TID WC   ipratropium-albuterol  3 mL Nebulization BID   linaclotide  145 mcg Oral QAC breakfast   pantoprazole  40 mg Oral Daily   predniSONE  20 mg Oral Q breakfast   rosuvastatin  10 mg Oral QHS   sulfamethoxazole-trimethoprim  1 tablet Oral Once per day on Mon Wed Fri   vitamin B-12  1,000 mcg Oral Daily   Continuous Infusions:  heparin 900 Units/hr (03/28/22 2042)   meropenem (MERREM) IV 1 g (03/29/22 [id number])   vancomycin      Procedures/Studies: CT Head Wo Contrast  Result Date: 03/26/2022 CLINICAL DATA:  Altered mental status.  Recent stroke. EXAM: CT HEAD WITHOUT CONTRAST TECHNIQUE: Contiguous axial images were obtained from the base of the skull through the vertex without intravenous contrast. RADIATION DOSE REDUCTION:  This exam was performed according to the departmental dose-optimization program which includes automated exposure control, adjustment of the mA and/or kV according to patient size and/or use of iterative reconstruction technique. COMPARISON:  11/01/2020 FINDINGS: Brain: No evidence of intracranial hemorrhage, acute infarction, hydrocephalus, extra-axial collection, or mass lesion/mass effect. 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 Vascular:  No hyperdense vessel or other acute findings. Skull: No evidence of fracture or other significant bone abnormality. Sinuses/Orbits:  No acute findings. Other: None. IMPRESSION: No acute intracranial abnormality. Electronically Signed   By: Marlaine Hind M.D.   On: 03/26/2022 11:42   CT Angio Chest Pulmonary Embolism (PE) W or WO Contrast  Result Date: 03/27/2022 CLINICAL DATA:  Chest pain and shortness of breath for several weeks, recent pneumonia EXAM: CT ANGIOGRAPHY CHEST WITH CONTRAST TECHNIQUE: Multidetector CT imaging of the chest was performed using the standard protocol during bolus administration of intravenous contrast. Multiplanar CT image reconstructions and MIPs were obtained to evaluate the vascular anatomy. RADIATION DOSE REDUCTION: This exam was performed according to the departmental dose-optimization program which includes automated exposure control, adjustment of the mA and/or kV according to patient size and/or use of iterative reconstruction technique. CONTRAST:  58mL OMNIPAQUE IOHEXOL 300 MG/ML  SOLN IV COMPARISON:  CT chest 01/04/2022 FINDINGS: Cardiovascular: Aorta  normal caliber without aneurysm or dissection. Proximal great vessels unremarkable. Heart normal appearance. No pericardial effusion. Pulmonary arteries adequately opacified. Two small filling defects are identified consistent with pulmonary embolism, in RIGHT lower lobe image 214 and in RIGHT middle lobe image 160.  No additional emboli. Mediastinum/Nodes: Esophagus unremarkable. Few normal sized mediastinal lymph nodes. No thoracic adenopathy. Base of cervical region normal appearance. Lungs/Pleura: Small BILATERAL pleural effusions. BILATERAL pulmonary infiltrates, greatest in RIGHT upper lobe. No pneumothorax or mass. Upper Abdomen: Gallbladder surgically absent. Splenic deformity question related to prior infarct or surgery. Musculoskeletal: No acute osseous findings. Review of the MIP images confirms the above findings. IMPRESSION: Small pulmonary emboli in RIGHT lung. Small BILATERAL pleural effusions. Patchy BILATERAL pulmonary infiltrates consistent with multifocal pneumonia greatest in RIGHT upper lobe. Critical Value/emergent results were called by telephone at the time of interpretation on 03/27/2022 at 1510 hrs to provider DAVID TAT MD, who verbally acknowledged these results. Electronically Signed   By: Lavonia Dana M.D.   On: 03/27/2022 15:13   DG Chest Port 1 View  Result Date: 03/29/2022 CLINICAL DATA:  Shortness of breath and cough. EXAM: PORTABLE CHEST 1 VIEW COMPARISON:  03/26/2022 FINDINGS: Stable cardiomediastinal contours. Lungs are suboptimally inflated. No signs of pleural effusion or edema. Persistent asymmetric airspace opacification within the right upper lobe. IMPRESSION: Persistent asymmetric airspace opacification within the right upper lobe compatible with pneumonia. Electronically Signed   By: Kerby Moors M.D.   On: 03/29/2022 06:43   DG Chest Port 1 View  Result Date: 03/26/2022 CLINICAL DATA:  Sepsis. EXAM: PORTABLE CHEST 1 VIEW COMPARISON:  February 19, 2022 FINDINGS: There is infiltrate in the right mid lung. There may be infiltrate in the medial left upper lung. No pneumothorax. No other acute interval changes. The study is limited however due to portable technique. IMPRESSION: The study is limited due to low volume portable technique. However, there is an infiltrate in the right mid lung  consistent with for pneumonia given history. There may be a more subtle smaller infiltrate in the medial left upper lung. Electronically Signed   By: Dorise Bullion III M.D.   On: 03/26/2022 10:22   ECHOCARDIOGRAM COMPLETE  Result Date: 03/28/2022    ECHOCARDIOGRAM REPORT   Patient Name:   Elim Jane Date of Exam: 03/28/2022 Medical Rec #:  [id number]   Height:       64.0 in Accession #:    [id number]  Weight:       211.4 lb Date of Birth:  10-08-1966   BSA:          2.003 m Patient Age:    38 years    BP:           121/84 mmHg Patient Gender: F           HR:           86 bpm. Exam Location:  [facility] Procedure: 2D Echo, Cardiac Doppler and Color Doppler Indications:    Pulmonary embolus  History:        Patient has no prior history of Echocardiogram examinations.                 Risk Factors:Hypertension and Dyslipidemia.  Sonographer:    Wenda Low Referring Phys: [phone] DAVID TAT IMPRESSIONS  1. Left ventricular ejection fraction, by estimation, is 55%. The left ventricle has normal function. The left ventricle has no regional wall motion abnormalities. Left ventricular diastolic parameters were normal.  2. Right ventricular systolic  function is normal. The right ventricular size is normal. There is normal pulmonary artery systolic pressure.  3. The pericardial effusion is anterior to the right ventricle.  4. The mitral valve is abnormal. Trivial mitral valve regurgitation. No evidence of mitral stenosis.  5. The aortic valve is tricuspid. There is mild calcification of the aortic valve. Aortic valve regurgitation is not visualized. Aortic valve sclerosis is present, with no evidence of aortic valve stenosis.  6. The inferior vena cava is normal in size with greater than 50% respiratory variability, suggesting right atrial pressure of 3 mmHg. FINDINGS  Left Ventricle: Left ventricular ejection fraction, by estimation, is 55%. The left ventricle has normal function. The left ventricle has no regional wall  motion abnormalities. The left ventricular internal cavity size was normal in size. There is no left ventricular hypertrophy. Left ventricular diastolic parameters were normal. Right Ventricle: The right ventricular size is normal. No increase in right ventricular wall thickness. Right ventricular systolic function is normal. There is normal pulmonary artery systolic pressure. The tricuspid regurgitant velocity is 1.76 m/s, and  with an assumed right atrial pressure of 3 mmHg, the estimated right ventricular systolic pressure is 123XX123 mmHg. Left Atrium: Left atrial size was normal in size. Right Atrium: Right atrial size was normal in size. Pericardium: Trivial pericardial effusion is present. The pericardial effusion is anterior to the right ventricle. Mitral Valve: The mitral valve is abnormal. There is mild thickening of the mitral valve leaflet(s). There is mild calcification of the mitral valve leaflet(s). Trivial mitral valve regurgitation. No evidence of mitral valve stenosis. MV peak gradient, 6.5 mmHg. The mean mitral valve gradient is 3.0 mmHg. Tricuspid Valve: The tricuspid valve is normal in structure. Tricuspid valve regurgitation is trivial. No evidence of tricuspid stenosis. Aortic Valve: The aortic valve is tricuspid. There is mild calcification of the aortic valve. Aortic valve regurgitation is not visualized. Aortic valve sclerosis is present, with no evidence of aortic valve stenosis. Aortic valve mean gradient measures 5.0 mmHg. Aortic valve peak gradient measures 10.8 mmHg. Aortic valve area, by VTI measures 2.22 cm. Pulmonic Valve: The pulmonic valve was normal in structure. Pulmonic valve regurgitation is not visualized. No evidence of pulmonic stenosis. Aorta: The aortic root is normal in size and structure. Venous: The inferior vena cava is normal in size with greater than 50% respiratory variability, suggesting right atrial pressure of 3 mmHg. IAS/Shunts: No atrial level shunt detected by  color flow Doppler.  LEFT VENTRICLE PLAX 2D LVIDd:         4.90 cm     Diastology LVIDs:         3.50 cm     LV e' medial:    7.72 cm/s LV PW:         1.00 cm     LV E/e' medial:  15.2 LV IVS:        0.90 cm     LV e' lateral:   14.60 cm/s LVOT diam:     2.00 cm     LV E/e' lateral: 8.0 LV SV:         76 LV SV Index:   38 LVOT Area:     3.14 cm  LV Volumes (MOD) LV vol d, MOD A2C: 60.2 ml LV vol d, MOD A4C: 49.4 ml LV vol s, MOD A2C: 24.4 ml LV vol s, MOD A4C: 23.8 ml LV SV MOD A2C:     35.8 ml LV SV MOD A4C:  49.4 ml LV SV MOD BP:      30.8 ml RIGHT VENTRICLE RV Basal diam:  3.15 cm RV Mid diam:    2.80 cm RV S prime:     13.40 cm/s TAPSE (M-mode): 2.3 cm LEFT ATRIUM             Index        RIGHT ATRIUM           Index LA diam:        3.70 cm 1.85 cm/m   RA Area:     15.50 cm LA Vol (A2C):   46.0 ml 22.96 ml/m  RA Volume:   37.70 ml  18.82 ml/m LA Vol (A4C):   50.9 ml 25.41 ml/m LA Biplane Vol: 50.3 ml 25.11 ml/m  AORTIC VALVE                     PULMONIC VALVE AV Area (Vmax):    2.38 cm      PV Vmax:       0.90 m/s AV Area (Vmean):   2.30 cm      PV Peak grad:  3.2 mmHg AV Area (VTI):     2.22 cm AV Vmax:           164.00 cm/s AV Vmean:          107.000 cm/s AV VTI:            0.344 m AV Peak Grad:      10.8 mmHg AV Mean Grad:      5.0 mmHg LVOT Vmax:         124.00 cm/s LVOT Vmean:        78.500 cm/s LVOT VTI:          0.243 m LVOT/AV VTI ratio: 0.71  AORTA Ao Root diam: 2.90 cm MITRAL VALVE                TRICUSPID VALVE MV Area (PHT): 3.68 cm     TR Peak grad:   12.4 mmHg MV Area VTI:   2.13 cm     TR Vmax:        176.00 cm/s MV Peak grad:  6.5 mmHg MV Mean grad:  3.0 mmHg     SHUNTS MV Vmax:       1.27 m/s     Systemic VTI:  0.24 m MV Vmean:      77.6 cm/s    Systemic Diam: 2.00 cm MV Decel Time: 206 msec MV E velocity: 117.00 cm/s MV A velocity: 89.50 cm/s MV E/A ratio:  1.31 Jenkins Rouge MD Electronically signed by Jenkins Rouge MD Signature Date/Time: 03/28/2022/12:20:15 PM    Final      LOS:  3 days   Signature  Lala Lund M.D on 03/29/2022 at 11:08 AM   -  To page go to [email or web address]

## 2022-03-29 NOTE — Progress Notes (Signed)
Pulmonary and Critical Care Medicine   Patient name: [name] Admit date: 03/26/2022  DOB: 23-Nov-1965 LOS: 3  MRN: [id number] Consult date: 03/28/2022  Referring provider: Dr. Carles Collet, [facility] CC: Hemoptysis    History:  56 yo female was brought to [place] ER with AMS.  SpO2 in 70's on room air.  She was noted to have fever 104F, and hypotensive.  She was started on antibiotics for pneumonia and UTI.  CT chest showed small PEs.  She was started on heparin infusion.  She had persistent hemoptysis.  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  She had sputum culture from 02/21/22 that grew MRSA.    He had recent hospitalization for myositis (02/19/22 to 02/23/22) and treated with clindamycin and steroid taper.    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  She is followed by Dr. Valentino Nose with rheumatology and Dr. Dionne Milo with pulmonary.  Past medical history:  Chronic pain, DM type 2, GERD, Hypothyroidism, OSA, Osteomyelitis, Neuropathy, Steroid induced adrenal insufficiency, COVID 19 infection January 2022  Significant events:  6/04 Admit, start ABx 6/05 start heparin gtt 03/28/2022 transferred to [facility]  Studies:  CT angio chest 03/27/22 >> 2 small filling defects in RLL and RML, small b/l effusions, b/l infiltrates Echo 03/28/22 >> EF 55%   Micro:  COVID/Flu 6/04 >> negative Blood 6/04 >> Urine 6/04 >> E coli   Lines:     Antibiotics:  Cefepime 6/04  Meropenem 6/04 >> Vancomycin 6/04 >>   Consults:      Interim history:  She was diagnosed with Wegener's about 15 yrs ago.  She was having recurrent sinus and lower respiratory infections that wouldn't respond to therapy.  She ultimately was seen by ENT and had nasal biopsy showing vasculitis.  She has been seen previously at [facility], [facility] clinic and [place].  More recently has been followed by specialists in  [place].  She had pneumonia in December.  She gets better with therapy in the hospital with antibiotics.  About 2 weeks after being discharged she gets recurrent symptoms of pneumonia.  She also gets episodes of lethargy, and this is associated with headache and twitching. I suspect she may be aspirating during the night due to sedation with narcotics  Vital signs:  BP (!) 159/73 (BP Location: Right Arm)   Pulse 66   Temp 99.9 F (37.7 C) (Oral)   Resp 19   Ht _0  (1.626 m)   Wt 91.4 kg   SpO2 94%   BMI 34.59 kg/m   Intake/output:  I/O last 3 completed shifts: In: 1480.2 [P.O.:720; I.V.:760.2] Out: 3 [Urine:2; Stool:1]   Physical exam:  General: Obese female who is somewhat strange affect HEENT: MM pink/moist voice is very hoarse Neuro: Follows commands but difficult to understand due to her CV: Heart sounds regular PULM: Coarse rhonchi bilaterally   GI: soft, bsx4 active  GU: Extremities: Bilateral transmetatarsal amputations well-healed   Skin: no rashes or lesions    Best practice:   DVT - heparin gtt SUP - protonix Nutrition - carb modified    Discussion:  She has 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  She has recurrent episodes of pneumonia since December 2022.  She had bronchoscopy in March 2023 with sputum AFB growing Mycobacterium gordonae.  It is uncertain whether this is causing infection that gets partially treated  with course of antibiotics while in hospital, or colonization.  She also had MRSA in sputum culture from May 2023.  She also has sleep disordered breathing and is on chronic opiate medication.  She has episodes of lethargy with possible myoclonus which could be seen with hypercapnia.    Assessment/plan:   Recurrent pneumonia. Continue current antimicrobial therapy Consider infectious disease consult Questionable need for bronchoscopy for BAL with repeat  AFB culture Note she is having some sputum production chunks of blood tinged   Pulmonary embolism. Is related obese relatively small most likely impacting respiratory status Continue heparin for now    Sleep disordered breathing. Consider ABG on 03/30/2022 in a.m.  Granulomatosis with polyangiitis. Continue prednisone and Bactrim Methotrexate on hold Questionable set up with new rheumatologist and pulmonary team    Chronic pain with chronic opiate medication use. CKD 3a. Chemotherapy induced neuropathy. Per primary team  Transferred to [facility] 03/28/2022 for infectious disease formal consult  Resolved hospital problems:    Goals of care/Family discussions:  Code status: full    Labs:      Latest Ref Rng & Units 03/29/2022    6:22 AM 03/28/2022    4:02 AM 03/27/2022    5:35 AM  CMP  Glucose 70 - 99 mg/dL 124   148   118    BUN 6 - 20 mg/dL _0 Creatinine 0.44 - 1.00 mg/dL 0.81   0.69   0.86    Sodium 135 - 145 mmol/L 140   141   143    Potassium 3.5 - 5.1 mmol/L 3.6   3.1   3.8    Chloride 98 - 111 mmol/L 110   121   122    CO2 22 - 32 mmol/L _1 Calcium 8.9 - 10.3 mg/dL 8.0   7.3   7.2    Total Protein 6.5 - 8.1 g/dL 5.0    4.7    Total Bilirubin 0.3 - 1.2 mg/dL 0.5    0.6    Alkaline Phos 38 - 126 U/L 69    67    AST 15 - 41 U/L 19    23    ALT 0 - 44 U/L 17    17         Latest Ref Rng & Units 03/29/2022    6:22 AM 03/29/2022    1:42 AM 03/28/2022    4:02 AM  CBC  WBC 4.0 - 10.5 K/uL 8.9   11.0   13.8    Hemoglobin 12.0 - 15.0 g/dL 8.2   9.2   7.8    Hematocrit 36.0 - 46.0 % 28.1   31.1   26.6    Platelets 150 - 400 K/uL 246   272   228      ABG    Component Value Date/Time   HCO3 21.6 03/26/2022 1027   ACIDBASEDEF 4.8 (H) 03/26/2022 1027   O2SAT 68.5 03/26/2022 1027    CBG (last 3)  Recent Labs    03/28/22 1604 03/28/22 2012 03/29/22 0800  GLUCAP 262* 210* 112*     Past surgical history:  She  has a past  surgical history that includes Cholecystectomy; Appendectomy; Vaginal hysterectomy; splenic aneurysm; Hernia repair (2000); Septoplasty (02/2013); Colonoscopy (N/A, 06/02/2013); Back surgery; spleen anuyism; Incision and drainage abscess (Left, 07/07/2016); Amputation toe (Bilateral, 08/12/2020); Transmetatarsal amputation (Bilateral, 10/21/2020); Amputation (Right, 10/21/2020); Transmetatarsal  amputation (Left, 03/24/2021); and Amputation toe (Right, 03/24/2021).  Social history:  She  reports that she has never smoked. She has never used smokeless tobacco. She reports current alcohol use. She reports that she does not use drugs.   Review of systems:  Reviewed and negative  Family history:  Her family history includes Diabetes in her father and mother; Heart disease in her mother.    Medications:   No current facility-administered medications on file prior to encounter.   Current Outpatient Medications on File Prior to Encounter  Medication Sig   acetaminophen (TYLENOL) 325 MG tablet Take 2 tablets (650 mg total) by mouth every 6 (six) hours as needed for mild pain (or Fever >/= 101).   albuterol (PROVENTIL HFA;VENTOLIN HFA) 108 (90 Base) MCG/ACT inhaler Inhale 2 puffs into the lungs every 6 (six) hours as needed for wheezing or shortness of breath.   amitriptyline (ELAVIL) 50 MG tablet Take 50 mg by mouth at bedtime.   Calcium Carb-Cholecalciferol (CALCIUM CARBONATE-VITAMIN D3 PO) Take 1 tablet by mouth daily.   DULoxetine (CYMBALTA) 60 MG capsule Take 60 mg by mouth daily.   folic acid (FOLVITE) 1 MG tablet Take 2 mg by mouth daily.   gabapentin (NEURONTIN) 300 MG capsule Take 600-1,200 mg by mouth See admin instructions. Take 3 (900 mg total) capsules by mouth in the morning, 2 (600 mg) capsule in the afternoon and 4 (1200 mg) capsules in the evening.   lidocaine (LIDODERM) 5 % Place 1 patch onto the skin daily as needed (for pain). Remove & Discard patch within 12 hours or as directed by MD    linaclotide (LINZESS) 145 MCG CAPS capsule Take 145 mcg by mouth daily before breakfast.   Melatonin 10 MG TABS Take 10 mg by mouth at bedtime as needed (sleep).   metFORMIN (GLUCOPHAGE-XR) 500 MG 24 hr tablet Take 500 mg by mouth daily with breakfast.   methocarbamol (ROBAXIN) 500 MG tablet Take 500 mg by mouth every 6 (six) hours as needed for muscle spasms.   METHOTREXATE SODIUM IJ Inject 25 mg into the skin every Sunday.   naloxone (NARCAN) nasal spray 4 mg/0.1 mL Place 0.4 mg into the nose once.   nystatin (MYCOSTATIN) 100000 UNIT/ML suspension Use as directed 5 mLs in the mouth or throat 4 (four) times daily.   pantoprazole (PROTONIX) 40 MG tablet Take 40 mg by mouth daily.   Potassium Chloride Crys ER (K-DUR PO) Take 40 mEq by mouth every other day.   predniSONE (DELTASONE) 10 MG tablet Take 4 tabs (53m) daily for 2 days, then reduce to your home dose of 568mdaily (Patient taking differently: Take 5 mg by mouth daily with breakfast.)   rosuvastatin (CRESTOR) 10 MG tablet Take 10 mg by mouth at bedtime.   sulfamethoxazole-trimethoprim (BACTRIM) 400-80 MG tablet Take 1 tablet by mouth 3 (three) times a week. Monday,Wednesday and Friday   tapentadol HCl (NUCYNTA) 75 MG tablet Take 75 mg by mouth. Take 1 tablet in the morning, 1 tablet in the afternoon and 2 tablets at bedtime   topiramate (TOPAMAX) 100 MG tablet Take 100 mg by mouth daily.   vitamin B-12 (CYANOCOBALAMIN) 1000 MCG tablet Take 1,000 mcg by mouth daily.   Vitamin D, Ergocalciferol, (DRISDOL) 50000 units CAPS capsule Take 50,000 Units by mouth every Friday.   oxyCODONE 10 MG TABS Take 1 tablet (10 mg total) by mouth every 4 (four) hours as needed for severe pain or breakthrough pain. (Patient not taking: Reported on  03/26/2022)   senna (SENOKOT) 8.6 MG TABS tablet Take 2 tablets (17.2 mg total) by mouth 2 (two) times daily. (Patient not taking: Reported on 03/26/2022)     Signature:  Richardson Landry Jamol Ginyard ACNP Acute Care Nurse  Practitioner [place] Please consult Amion 03/29/2022, 8:52 AM

## 2022-03-30 ENCOUNTER — Encounter (HOSPITAL_COMMUNITY): Payer: Self-pay | Admitting: Internal Medicine

## 2022-03-30 ENCOUNTER — Inpatient Hospital Stay (HOSPITAL_COMMUNITY): Payer: BC Managed Care – PPO | Admitting: Certified Registered Nurse Anesthetist

## 2022-03-30 ENCOUNTER — Inpatient Hospital Stay (HOSPITAL_COMMUNITY): Payer: BC Managed Care – PPO

## 2022-03-30 ENCOUNTER — Encounter (HOSPITAL_COMMUNITY)
Admission: EM | Disposition: A | Discharge status: Home Health | Payer: Self-pay | Source: Home / Self Care | Attending: Internal Medicine

## 2022-03-30 DIAGNOSIS — R7989 Other specified abnormal findings of blood chemistry: Secondary | ICD-10-CM | POA: Diagnosis not present

## 2022-03-30 DIAGNOSIS — T451X5A Adverse effect of antineoplastic and immunosuppressive drugs, initial encounter: Secondary | ICD-10-CM | POA: Diagnosis not present

## 2022-03-30 DIAGNOSIS — I2699 Other pulmonary embolism without acute cor pulmonale: Secondary | ICD-10-CM

## 2022-03-30 DIAGNOSIS — R6520 Severe sepsis without septic shock: Secondary | ICD-10-CM | POA: Diagnosis not present

## 2022-03-30 DIAGNOSIS — R2243 Localized swelling, mass and lump, lower limb, bilateral: Secondary | ICD-10-CM

## 2022-03-30 DIAGNOSIS — M313 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: Secondary | ICD-10-CM | POA: Diagnosis not present

## 2022-03-30 DIAGNOSIS — M7989 Other specified soft tissue disorders: Secondary | ICD-10-CM

## 2022-03-30 DIAGNOSIS — A419 Sepsis, unspecified organism: Secondary | ICD-10-CM | POA: Diagnosis not present

## 2022-03-30 DIAGNOSIS — Z8701 Personal history of pneumonia (recurrent): Secondary | ICD-10-CM | POA: Diagnosis not present

## 2022-03-30 HISTORY — PX: BRONCHIAL WASHINGS: SHX5105

## 2022-03-30 HISTORY — PX: BIOPSY: SHX5522

## 2022-03-30 HISTORY — PX: VIDEO BRONCHOSCOPY: SHX5072

## 2022-03-30 LAB — COMPREHENSIVE METABOLIC PANEL
ALT: 19 U/L (ref 0–44)
AST: 18 U/L (ref 15–41)
Albumin: 2.4 g/dL — ABNORMAL LOW (ref 3.5–5.0)
Alkaline Phosphatase: 80 U/L (ref 38–126)
Anion gap: 7 (ref 5–15)
BUN: 8 mg/dL (ref 6–20)
CO2: 20 mmol/L — ABNORMAL LOW (ref 22–32)
Calcium: 8.2 mg/dL — ABNORMAL LOW (ref 8.9–10.3)
Chloride: 112 mmol/L — ABNORMAL HIGH (ref 98–111)
Creatinine, Ser: 0.93 mg/dL (ref 0.44–1.00)
GFR, Estimated: >60 mL/min (ref >60)
Glucose, Bld: 166 mg/dL — ABNORMAL HIGH (ref 70–99)
Potassium: 3.5 mmol/L (ref 3.5–5.1)
Sodium: 139 mmol/L (ref 135–145)
Total Bilirubin: 0.4 mg/dL (ref 0.3–1.2)
Total Protein: 4.9 g/dL — ABNORMAL LOW (ref 6.5–8.1)

## 2022-03-30 LAB — CBC WITH DIFFERENTIAL/PLATELET
Abs Immature Granulocytes: 0.3 10*3/uL — ABNORMAL HIGH (ref 0.00–0.07)
Basophils Absolute: 0 10*3/uL (ref 0.0–0.1)
Basophils Relative: 0 %
Eosinophils Absolute: 0 10*3/uL (ref 0.0–0.5)
Eosinophils Relative: 0 %
HCT: 30.9 % — ABNORMAL LOW (ref 36.0–46.0)
Hemoglobin: 9 g/dL — ABNORMAL LOW (ref 12.0–15.0)
Lymphocytes Relative: 14 %
Lymphs Abs: 1.4 10*3/uL (ref 0.7–4.0)
MCH: 25.5 pg — ABNORMAL LOW (ref 26.0–34.0)
MCHC: 29.1 g/dL — ABNORMAL LOW (ref 30.0–36.0)
MCV: 87.5 fL (ref 80.0–100.0)
Metamyelocytes Relative: 2 %
Monocytes Absolute: 0.5 10*3/uL (ref 0.1–1.0)
Monocytes Relative: 5 %
Myelocytes: 1 %
Neutro Abs: 7.6 10*3/uL (ref 1.7–7.7)
Neutrophils Relative %: 78 %
Platelets: 287 10*3/uL (ref 150–400)
RBC: 3.53 MIL/uL — ABNORMAL LOW (ref 3.87–5.11)
RDW: 22.5 % — ABNORMAL HIGH (ref 11.5–15.5)
WBC: 9.7 10*3/uL (ref 4.0–10.5)
nRBC: 0.7 % — ABNORMAL HIGH (ref 0.0–0.2)
nRBC: 1 /100 WBC — ABNORMAL HIGH

## 2022-03-30 LAB — GLUCOSE, CAPILLARY
Glucose-Capillary: 98 mg/dL (ref 70–99)
Glucose-Capillary: 91 mg/dL (ref 70–99)
Glucose-Capillary: 363 mg/dL — ABNORMAL HIGH (ref 70–99)
Glucose-Capillary: 256 mg/dL — ABNORMAL HIGH (ref 70–99)

## 2022-03-30 LAB — BODY FLUID CELL COUNT WITH DIFFERENTIAL
Eos, Fluid: 8 %
Lymphs, Fluid: 14 %
Monocyte-Macrophage-Serous Fluid: 22 % — ABNORMAL LOW (ref 50–90)
Neutrophil Count, Fluid: 56 % — ABNORMAL HIGH (ref 0–25)
Total Nucleated Cell Count, Fluid: 1290 cu mm — ABNORMAL HIGH (ref 0–1000)

## 2022-03-30 LAB — MAGNESIUM: Magnesium: 2 mg/dL (ref 1.7–2.4)

## 2022-03-30 LAB — HEPARIN LEVEL (UNFRACTIONATED)
Heparin Unfractionated: 0.17 IU/mL — ABNORMAL LOW (ref 0.30–0.70)
Heparin Unfractionated: 0.26 IU/mL — ABNORMAL LOW (ref 0.30–0.70)

## 2022-03-30 LAB — C-REACTIVE PROTEIN: CRP: 3.3 mg/dL — ABNORMAL HIGH (ref <1.0)

## 2022-03-30 LAB — BRAIN NATRIURETIC PEPTIDE: B Natriuretic Peptide: 112.8 pg/mL — ABNORMAL HIGH (ref 0.0–100.0)

## 2022-03-30 SURGERY — BRONCHOSCOPY, WITH FLUOROSCOPY
Anesthesia: General | Laterality: Right

## 2022-03-30 MED ORDER — FENTANYL CITRATE (PF) 250 MCG/5ML IJ SOLN
INTRAMUSCULAR | Status: DC | PRN
Start: 2022-03-30 — End: 2022-03-30
  Administered 2022-03-30 (×2): 50 ug via INTRAVENOUS

## 2022-03-30 MED ORDER — HEPARIN (PORCINE) 25000 UT/250ML-% IV SOLN
1100.0000 [IU]/h | INTRAVENOUS | Status: DC
Start: 2022-03-30 — End: 2022-03-30
  Filled 2022-03-30: qty 250

## 2022-03-30 MED ORDER — ROCURONIUM BROMIDE 10 MG/ML (PF) SYRINGE
PREFILLED_SYRINGE | INTRAVENOUS | Status: DC | PRN
  Administered 2022-03-30: 100 mg via INTRAVENOUS

## 2022-03-30 MED ORDER — HEPARIN BOLUS VIA INFUSION
2000.0000 [IU] | Freq: Once | INTRAVENOUS | Status: AC
  Administered 2022-03-30: 2000 [IU] via INTRAVENOUS
  Filled 2022-03-30: qty 2000

## 2022-03-30 MED ORDER — POTASSIUM CHLORIDE 20 MEQ PO PACK: 40.0000 meq | PACK | Freq: Once | ORAL | Status: DC

## 2022-03-30 MED ORDER — SUGAMMADEX SODIUM 200 MG/2ML IV SOLN
INTRAVENOUS | Status: DC | PRN
  Administered 2022-03-30: 400 mg via INTRAVENOUS

## 2022-03-30 MED ORDER — PROPOFOL 500 MG/50ML IV EMUL
INTRAVENOUS | Status: DC | PRN
  Administered 2022-03-30: 150 ug/kg/min via INTRAVENOUS

## 2022-03-30 MED ORDER — FENTANYL CITRATE (PF) 100 MCG/2ML IJ SOLN
INTRAMUSCULAR | Status: AC
  Filled 2022-03-30: qty 2

## 2022-03-30 MED ORDER — ONDANSETRON HCL 4 MG/2ML IJ SOLN
INTRAMUSCULAR | Status: DC | PRN
  Administered 2022-03-30: 4 mg via INTRAVENOUS

## 2022-03-30 MED ORDER — LACTATED RINGERS IV SOLN
INTRAVENOUS | Status: AC | PRN
  Administered 2022-03-30: 1000 mL via INTRAVENOUS

## 2022-03-30 MED ORDER — POTASSIUM CHLORIDE 20 MEQ PO PACK
40.0000 meq | PACK | Freq: Once | ORAL | Status: AC
  Administered 2022-03-30: 40 meq via ORAL
  Filled 2022-03-30: qty 2

## 2022-03-30 MED ORDER — LIDOCAINE 2% (20 MG/ML) 5 ML SYRINGE
INTRAMUSCULAR | Status: DC | PRN
  Administered 2022-03-30: 100 mg via INTRAVENOUS

## 2022-03-30 MED ORDER — DEXAMETHASONE SODIUM PHOSPHATE 10 MG/ML IJ SOLN
INTRAMUSCULAR | Status: DC | PRN
  Administered 2022-03-30: 10 mg via INTRAVENOUS

## 2022-03-30 MED ORDER — PROPOFOL 10 MG/ML IV BOLUS
INTRAVENOUS | Status: DC | PRN
  Administered 2022-03-30: 200 mg via INTRAVENOUS

## 2022-03-30 MED ORDER — HEPARIN (PORCINE) 25000 UT/250ML-% IV SOLN
1300.0000 [IU]/h | INTRAVENOUS | Status: DC
  Filled 2022-03-30: qty 250

## 2022-03-30 MED ORDER — HEPARIN (PORCINE) 25000 UT/250ML-% IV SOLN: 1100.0000 [IU]/h | INTRAVENOUS | Status: DC

## 2022-03-30 NOTE — Progress Notes (Signed)
[facility]    Date of Admission:  03/26/2022   Total days of antibiotics 4/meropenem and vanco and bactrim TIW           ID: [name] is a 56 y.o. female with  wegener's with recurrent pneumonia Principal Problem:   Severe sepsis (HCC) Active Problems:   Presence of intrathecal pump   Chemotherapy-induced peripheral neuropathy (HCC)   Secondary adrenal insufficiency (HCC)   Stage 3a chronic kidney disease (HCC)   Lobar pneumonia (HCC)   Granulomatosis with polyangiitis (HCC)   Class 2 obesity   UTI (urinary tract infection)   Opioid dependence (HCC)   Acute metabolic encephalopathy   Acute pulmonary embolus (HCC)    Subjective: Tolerated bronchoscopy without problems. Afebrile. Still some mild cough  ROS: otherwise negative  Medications:   amitriptyline  50 mg Oral QHS   budesonide (PULMICORT) nebulizer solution  0.5 mg Nebulization BID   Chlorhexidine Gluconate Cloth  6 each Topical Daily   DULoxetine  30 mg Oral Daily   folic acid  2 mg Oral Daily   gabapentin  600 mg Oral TID   insulin aspart  0-9 Units Subcutaneous TID WC   ipratropium-albuterol  3 mL Nebulization BID   linaclotide  145 mcg Oral QAC breakfast   pantoprazole  40 mg Oral Daily   predniSONE  20 mg Oral Q breakfast   rosuvastatin  10 mg Oral QHS   sulfamethoxazole-trimethoprim  1 tablet Oral Once per day on Mon Wed Fri   vitamin B-12  1,000 mcg Oral Daily    Objective: Vital signs in last 24 hours: Temp:  [97.8 F (36.6 C)-100.3 F (37.9 C)] 99 F (37.2 C) (06/08 1343) Pulse Rate:  [79-99] 86 (06/08 1343) Resp:  [14-25] 18 (06/08 1343) BP: (114-145)/(61-88) 124/80 (06/08 1343) SpO2:  [92 %-100 %] 94 % (06/08 1343) Weight:  [91.4 kg] 91.4 kg (06/08 1040) Physical Exam  Constitutional:  oriented to person, place, and time. appears well-developed and well-nourished. No distress.  HENT: [place]/AT, PERRLA, no scleral icterus Mouth/Throat: Oropharynx is clear and  moist. No oropharyngeal exudate.  Ext: bilateral TMA, right foot ulcer unchanged Skin: Skin is warm and dry. No rash noted. No erythema.  Psychiatric: a normal mood and affect.  behavior is normal.    Lab Results Recent Labs    03/29/22 0622 03/30/22 0040  WBC 8.9 9.7  HGB 8.2* 9.0*  HCT 28.1* 30.9*  NA 140 139  K 3.6 3.5  CL 110 112*  CO2 21* 20*  BUN 7 8  CREATININE 0.81 0.93   Liver Panel Recent Labs    03/29/22 0622 03/30/22 0040  PROT 5.0* 4.9*  ALBUMIN 2.4* 2.4*  AST 19 18  ALT 17 19  ALKPHOS 69 80  BILITOT 0.5 0.4   Sedimentation Rate No results for input(s): "ESRSEDRATE" in the last 72 hours. C-Reactive Protein Recent Labs    03/29/22 0622 03/30/22 0040  CRP 5.1* 3.3*    Microbiology: Reviewed.  Studies/Results: DG CHEST PORT 1 VIEW  Result Date: 03/30/2022 CLINICAL DATA:  Status post right lung biopsy EXAM: PORTABLE CHEST 1 VIEW COMPARISON:  03/29/2022 FINDINGS: Unchanged AP portable chest radiograph with subtle right greater than left upper lobe heterogeneous airspace opacities and probable small, layering bilateral pleural effusions. No new airspace opacity. No pneumothorax. Mild cardiomegaly. The visualized skeletal structures are unremarkable. IMPRESSION: Unchanged AP portable chest radiograph with subtle right greater than left upper lobe heterogeneous airspace opacities and  probable small, layering bilateral pleural effusions. No new airspace opacity. No pneumothorax following biopsy. Electronically Signed   By: Delanna Ahmadi M.D.   On: 03/30/2022 13:24   DG ESOPHAGUS W SINGLE CM (SOL OR THIN BA)  Result Date: 03/30/2022 CLINICAL DATA:  Patient complaining of frequent globus sensation, regurgitation, and history of esophageal stricture. EXAM: ESOPHAGUS/BARIUM SWALLOW/TABLET STUDY TECHNIQUE: Single contrast examination was performed using thin liquid barium. This exam was performed by Gareth Eagle, PA-C, and was supervised and interpreted by Dr. Fabiola Backer. FLUOROSCOPY: Radiation Exposure Index (as provided by the fluoroscopic device): 25.8 mGy Kerma COMPARISON:  CT chest dated March 27, 2022. FINDINGS: Study was limited secondary to patient's mobility issues and inability to stand. Swallowing: No aspiration seen. Esophagus: Smooth focal fixed narrowing of the distal esophagus just superior to the GE junction. No mucosal irregularity or defect. Esophageal motility: Mild dysmotility manifested by intermittent tertiary contractions. Hiatal Hernia: None. Gastroesophageal reflux: None visualized. Ingested 80mm barium tablet: Became stuck and did not pass beyond the gastroesophageal junction despite giving water and barium. Other: None. IMPRESSION: 1. Short-segment stricture of the distal esophagus just superior to the GE junction which did not allow passage of a barium tablet. This is likely reflux related, although no reflux was elicited on today's study. 2. Mild esophageal dysmotility. Electronically Signed   By: Titus Dubin M.D.   On: 03/30/2022 09:27   DG Chest Port 1 View  Result Date: 03/29/2022 CLINICAL DATA:  Shortness of breath and cough. EXAM: PORTABLE CHEST 1 VIEW COMPARISON:  03/26/2022 FINDINGS: Stable cardiomediastinal contours. Lungs are suboptimally inflated. No signs of pleural effusion or edema. Persistent asymmetric airspace opacification within the right upper lobe. IMPRESSION: Persistent asymmetric airspace opacification within the right upper lobe compatible with pneumonia. Electronically Signed   By: Kerby Moors M.D.   On: 03/29/2022 06:43     Assessment/Plan: Immunocompromised host with atypical pneumonia vs. Wegener's flare = continue on vancomycin and meropenem for the time being. Will follow culture.   Pcp proph = continue on tiw bactrim (may need to consider treatment doses of bactrim if we think nocardia is higher on the list of differential for pneumonia. For the time being await to get results from  bronchoscopy)  Wegener's = continue with current regimen on pred 20 mg. We will try to find referral at [facility] center for [facility].    [facility] Pager: [phone]  03/30/2022, 3:55 PM

## 2022-03-30 NOTE — Progress Notes (Signed)
ANTICOAGULATION CONSULT NOTE  Pharmacy Consult for heparin Indication: pulmonary embolus  Allergies  Allergen Reactions   Ibuprofen Other (See Comments)    Pt is unable to take this due to kidney problems.      Penicillins Rash and Other (See Comments)    Has patient had a PCN reaction causing immediate rash, facial/tongue/throat swelling, SOB or lightheadedness with hypotension: No Has patient had a PCN reaction causing severe rash involving mucus membranes or skin necrosis: No Has patient had a PCN reaction that required hospitalization No Has patient had a PCN reaction occurring within the last 10 years: No If all of the above answers are "NO", then may proceed with Cephalosporin use.    Patient Measurements: Height: 5\' 4"  (162.6 cm) Weight: 91.4 kg (201 lb 8 oz) IBW/kg (Calculated) : 54.7 Heparin Dosing Weight: 76 kg  Vital Signs: Temp: 98.4 F (36.9 C) (06/07 2355) Temp Source: Oral (06/07 2355) BP: 131/61 (06/07 2355) Pulse Rate: 82 (06/07 2355)  Labs: Recent Labs    03/28/22 0402 03/28/22 0753 03/28/22 1317 03/28/22 1545 03/28/22 2218 03/29/22 0142 03/29/22 0622 03/30/22 0040  HGB 7.8*  --   --   --   --  9.2* 8.2* 9.0*  HCT 26.6*  --   --   --   --  31.1* 28.1* 30.9*  PLT 228  --   --   --   --  272 246 287  HEPARINUNFRC  --  0.94*  --    < > 0.62 0.40  --  0.17*  CREATININE 0.69  --   --   --   --   --  0.81 0.93  TROPONINIHS  --  8 10  --   --   --   --   --    < > = values in this interval not displayed.     Estimated Creatinine Clearance: 74 mL/min (by C-G formula based on SCr of 0.93 mg/dL).   Medical History: Past Medical History:  Diagnosis Date   Chronic cough    Chronic pain    Diabetes mellitus without complication (HCC)    Dyspnea    GERD (gastroesophageal reflux disease)    Hypokalemia    Hypothyroidism    Left wrist pain 06/01/2021   Myonecrosis (HCC) 07/01/2021   Neurocardiogenic syncope    OSA (obstructive sleep apnea)    does  not use CPAP   Osteomyelitis (HCC)    bilateral feet   Peripheral neuropathy    Presence of intrathecal pump    recieves Prialt/bupivicaine   Tear of gluteus medius tendon 07/01/2021   Wegener's granulomatosis 2009   Wegner's disease (congenital syphilitic osteochondritis)     Medications:  Medications Prior to Admission  Medication Sig Dispense Refill Last Dose   acetaminophen (TYLENOL) 325 MG tablet Take 2 tablets (650 mg total) by mouth every 6 (six) hours as needed for mild pain (or Fever >/= 101).   unknown   albuterol (PROVENTIL HFA;VENTOLIN HFA) 108 (90 Base) MCG/ACT inhaler Inhale 2 puffs into the lungs every 6 (six) hours as needed for wheezing or shortness of breath.   unknown   amitriptyline (ELAVIL) 50 MG tablet Take 50 mg by mouth at bedtime.   03/25/2022   Calcium Carb-Cholecalciferol (CALCIUM CARBONATE-VITAMIN D3 PO) Take 1 tablet by mouth daily.   unknown   DULoxetine (CYMBALTA) 60 MG capsule Take 60 mg by mouth daily.   03/26/2022   folic acid (FOLVITE) 1 MG tablet Take 2 mg by  mouth daily.   unknown   gabapentin (NEURONTIN) 300 MG capsule Take 600-1,200 mg by mouth See admin instructions. Take 3 (900 mg total) capsules by mouth in the morning, 2 (600 mg) capsule in the afternoon and 4 (1200 mg) capsules in the evening.   03/26/2022   lidocaine (LIDODERM) 5 % Place 1 patch onto the skin daily as needed (for pain). Remove & Discard patch within 12 hours or as directed by MD   03/25/2022   linaclotide (LINZESS) 145 MCG CAPS capsule Take 145 mcg by mouth daily before breakfast.   03/26/2022   Melatonin 10 MG TABS Take 10 mg by mouth at bedtime as needed (sleep).   03/25/2022   metFORMIN (GLUCOPHAGE-XR) 500 MG 24 hr tablet Take 500 mg by mouth daily with breakfast.   03/26/2022   methocarbamol (ROBAXIN) 500 MG tablet Take 500 mg by mouth every 6 (six) hours as needed for muscle spasms.   03/25/2022   METHOTREXATE SODIUM IJ Inject 25 mg into the skin every Sunday.   03/19/2022   naloxone (NARCAN)  nasal spray 4 mg/0.1 mL Place 0.4 mg into the nose once.   unknown   nystatin (MYCOSTATIN) 100000 UNIT/ML suspension Use as directed 5 mLs in the mouth or throat 4 (four) times daily.   Past Week   pantoprazole (PROTONIX) 40 MG tablet Take 40 mg by mouth daily.   03/26/2022   Potassium Chloride Crys ER (K-DUR PO) Take 40 mEq by mouth every other day.   unknown   predniSONE (DELTASONE) 10 MG tablet Take 4 tabs (40mg ) daily for 2 days, then reduce to your home dose of 5mg  daily (Patient taking differently: Take 5 mg by mouth daily with breakfast.) 30 tablet 0 03/26/2022   rosuvastatin (CRESTOR) 10 MG tablet Take 10 mg by mouth at bedtime.   03/25/2022   sulfamethoxazole-trimethoprim (BACTRIM) 400-80 MG tablet Take 1 tablet by mouth 3 (three) times a week. Monday,Wednesday and Friday   03/24/2022   tapentadol HCl (NUCYNTA) 75 MG tablet Take 75 mg by mouth. Take 1 tablet in the morning, 1 tablet in the afternoon and 2 tablets at bedtime   03/26/2022   topiramate (TOPAMAX) 100 MG tablet Take 100 mg by mouth daily.   03/26/2022   vitamin B-12 (CYANOCOBALAMIN) 1000 MCG tablet Take 1,000 mcg by mouth daily.   unknown   Vitamin D, Ergocalciferol, (DRISDOL) 50000 units CAPS capsule Take 50,000 Units by mouth every Friday.   03/24/2022   oxyCODONE 10 MG TABS Take 1 tablet (10 mg total) by mouth every 4 (four) hours as needed for severe pain or breakthrough pain. (Patient not taking: Reported on 03/26/2022) 30 tablet 0 Not Taking   senna (SENOKOT) 8.6 MG TABS tablet Take 2 tablets (17.2 mg total) by mouth 2 (two) times daily. (Patient not taking: Reported on 03/26/2022) 30 tablet 0 Not Taking    Assessment: Pharmacy consulted to dose heparin in patient with pulmonary embolism, confirmed by CT.  Patient is not on anticoagulation prior to admission. Last dose lovenox 40 mg on 6/5 1419.  Heparin level below goal 0.17 on 900 units/hr. Previously therapeutic on this rate, RN confirmed no issues with infusion or over bleeding. hgb  and plt stable.   Goal of Therapy:  Heparin level 0.3-0.7 units/ml Monitor platelets by anticoagulation protocol: Yes   Plan:  Give IV heparin bolus 2000 units x1 Increase heparin infusion (~3 units/kg/hr) to 1100 units/hr. Daily CBC and heparin level F/u transition to oral anticoagulation  Ruben Im, PharmD Clinical Pharmacist 03/30/2022 3:17 AM Please check AMION for all [facility] Pharmacy numbers

## 2022-03-30 NOTE — Interval H&P Note (Signed)
History and Physical Interval Note:  03/30/2022 8:35 AM  [name]  has presented today for surgery, with the diagnosis of pneumonia.  The various methods of treatment have been discussed with the patient and family. After consideration of risks, benefits and other options for treatment, the patient has consented to  Procedure(s) with comments: VIDEO BRONCHOSCOPY WITH FLUORO (Right) - atypical pneumonia as a surgical intervention.  The patient's history has been reviewed, patient examined, no change in status, stable for surgery.  I have reviewed the patient's chart and labs.  Questions were answered to the patient's satisfaction.     Lanah Steines A Bienvenido Proehl

## 2022-03-30 NOTE — Transfer of Care (Signed)
Immediate Anesthesia Transfer of Care Note  Patient: [name]  Procedure(s) Performed: VIDEO BRONCHOSCOPY WITH FLUORO (Right) BRONCHIAL WASHINGS BIOPSY  Patient Location: PACU  Anesthesia Type:General  Level of Consciousness: drowsy  Airway & Oxygen Therapy: Patient Spontanous Breathing and Patient connected to face mask oxygen  Post-op Assessment: Report given to RN and Post -op Vital signs reviewed and stable  Post vital signs: Reviewed and stable  Last Vitals:  Vitals Value Taken Time  BP 124/81 03/30/22 1245  Temp    Pulse 99 03/30/22 1254  Resp 16 03/30/22 1254  SpO2 94 % 03/30/22 1254  Vitals shown include unvalidated device data.  Last Pain:  Vitals:   03/30/22 1040  TempSrc: Temporal  PainSc: 8       Patients Stated Pain Goal: 2 (03/28/22 0743)  Complications: No notable events documented.

## 2022-03-30 NOTE — Op Note (Signed)
Bronchoscopy Procedure Note  Date of Operation: 03/30/2022  Pre-op Diagnosis: immunocompromised pneumonia  Post-op Diagnosis: same  Surgeon: Laurin Coder  Assistants:   Anesthesia: General endotracheal anesthesia  Operation: Flexible fiberoptic bronchoscopy, diagnostic BAL, biopsies  Findings: Thick mucoid secretions in the airway bilaterally  Specimen: Bronchoalveolar lavage right upper lobe, tissue biopsy right upper lobe  Estimated Blood Loss: less than 50   Drains: none   Complications: None  Indications and History: The patient is a 56 y.o. female with immunocompromise pneumonia.  The risks, benefits, complications, treatment options and expected outcomes were discussed with the patient.  The possibilities of reaction to medication, pulmonary aspiration, perforation of a viscus, bleeding, failure to diagnose a condition and creating a complication requiring transfusion or operation were discussed with the patient who freely signed the consent.    Description of Procedure: The patient was seen in the Holding Room and the site of surgery properly noted/marked.  The patient was taken to endoscopy suite, identified as [name] and the procedure verified as Flexible Fiberoptic Bronchoscopy.  A Time Out was held and the above information confirmed.   After the induction of topical nasopharyngeal anesthesia, the patient was positioned supine and the bronchoscope was passed through the endotracheal tube. The scope was then passed into the trachea.  Careful inspection of the tracheal lumen was accomplished. The scope was sequentially passed into the left main and then left upper and lower bronchi and segmental bronchi.  Airway looked normal, no samples taken on the left side The scope was then withdrawn and advanced into the right main bronchus and then into the RUL, RML, and RLL bronchi and segmental bronchi.  Bronchoalveolar lavage was done and there was 60 cc of blood-tinged  specimen-obtained, sent for analysis, transbronchial lung biopsies right upper lobe obtained  Endobronchial findings: Thick mucoid secretions in the airway Trachea: Normal mucosa Carina: Normal mucosa Right main bronchus: Normal mucosa Right upper lobe bronchus: Normal mucosa Right upper lobe bronchus: Normal mucosa Right upper lobe bronchus: Normal mucosa Left main bronchus: Normal mucosa Left upper lobe bronchus: Normal mucosa Left lower lobe bronchus: Normal mucosa  Bronchoalveolar lavage left upper lobe sent for analysis, Gram stain and cultures, fungal cultures, AFB, Aspergillus stains, PCP stains, cultures to be held for nocardia and actinomyces Histology-tissue to be sent for surgical path and tissue culture for fungus  The Patient was taken to the Endoscopy Recovery area in satisfactory condition.  Attestation: I performed the procedure.  Mychelle Kendra A Mahari Strahm

## 2022-03-30 NOTE — Progress Notes (Signed)
Tolerated bronchoscopy well  Serum Fungitell and Aspergillus requested

## 2022-03-30 NOTE — Progress Notes (Signed)
ANTICOAGULATION CONSULT NOTE  Pharmacy Consult for heparin Indication: pulmonary embolus  Allergies  Allergen Reactions   Ibuprofen Other (See Comments)    Pt is unable to take this due to kidney problems.      Penicillins Rash and Other (See Comments)    Has patient had a PCN reaction causing immediate rash, facial/tongue/throat swelling, SOB or lightheadedness with hypotension: No Has patient had a PCN reaction causing severe rash involving mucus membranes or skin necrosis: No Has patient had a PCN reaction that required hospitalization No Has patient had a PCN reaction occurring within the last 10 years: No If all of the above answers are "NO", then may proceed with Cephalosporin use.    Patient Measurements: Height: 5\' 4"  (162.6 cm) Weight: 91.4 kg (201 lb 8 oz) IBW/kg (Calculated) : 54.7 Heparin Dosing Weight: 76 kg  Vital Signs: Temp: 98.9 F (37.2 C) (06/08 2011) Temp Source: Oral (06/08 2011) BP: 111/72 (06/08 2011) Pulse Rate: 70 (06/08 2011)  Labs: Recent Labs    03/28/22 0402 03/28/22 0753 03/28/22 1317 03/28/22 1545 03/29/22 0142 03/29/22 0622 03/30/22 0040 03/30/22 2026  HGB 7.8*  --   --   --  9.2* 8.2* 9.0*  --   HCT 26.6*  --   --   --  31.1* 28.1* 30.9*  --   PLT 228  --   --   --  272 246 287  --   HEPARINUNFRC  --  0.94*  --    < > 0.40  --  0.17* 0.26*  CREATININE 0.69  --   --   --   --  0.81 0.93  --   TROPONINIHS  --  8 10  --   --   --   --   --    < > = values in this interval not displayed.     Estimated Creatinine Clearance: 74 mL/min (by C-G formula based on SCr of 0.93 mg/dL).   Medical History: Past Medical History:  Diagnosis Date   Chronic cough    Chronic pain    Diabetes mellitus without complication (HCC)    Dyspnea    GERD (gastroesophageal reflux disease)    Hypokalemia    Hypothyroidism    Left wrist pain 06/01/2021   Myonecrosis (HCC) 07/01/2021   Neurocardiogenic syncope    OSA (obstructive sleep apnea)    does  not use CPAP   Osteomyelitis (HCC)    bilateral feet   Peripheral neuropathy    Presence of intrathecal pump    recieves Prialt/bupivicaine   Tear of gluteus medius tendon 07/01/2021   Wegener's granulomatosis 2009   Wegner's disease (congenital syphilitic osteochondritis)     Medications:  Medications Prior to Admission  Medication Sig Dispense Refill Last Dose   acetaminophen (TYLENOL) 325 MG tablet Take 2 tablets (650 mg total) by mouth every 6 (six) hours as needed for mild pain (or Fever >/= 101).   unknown   albuterol (PROVENTIL HFA;VENTOLIN HFA) 108 (90 Base) MCG/ACT inhaler Inhale 2 puffs into the lungs every 6 (six) hours as needed for wheezing or shortness of breath.   unknown   amitriptyline (ELAVIL) 50 MG tablet Take 50 mg by mouth at bedtime.   03/25/2022   Calcium Carb-Cholecalciferol (CALCIUM CARBONATE-VITAMIN D3 PO) Take 1 tablet by mouth daily.   unknown   DULoxetine (CYMBALTA) 60 MG capsule Take 60 mg by mouth daily.   03/26/2022   folic acid (FOLVITE) 1 MG tablet Take 2 mg by  mouth daily.   unknown   gabapentin (NEURONTIN) 300 MG capsule Take 600-1,200 mg by mouth See admin instructions. Take 3 (900 mg total) capsules by mouth in the morning, 2 (600 mg) capsule in the afternoon and 4 (1200 mg) capsules in the evening.   03/26/2022   lidocaine (LIDODERM) 5 % Place 1 patch onto the skin daily as needed (for pain). Remove & Discard patch within 12 hours or as directed by MD   03/25/2022   linaclotide (LINZESS) 145 MCG CAPS capsule Take 145 mcg by mouth daily before breakfast.   03/26/2022   Melatonin 10 MG TABS Take 10 mg by mouth at bedtime as needed (sleep).   03/25/2022   metFORMIN (GLUCOPHAGE-XR) 500 MG 24 hr tablet Take 500 mg by mouth daily with breakfast.   03/26/2022   methocarbamol (ROBAXIN) 500 MG tablet Take 500 mg by mouth every 6 (six) hours as needed for muscle spasms.   03/25/2022   METHOTREXATE SODIUM IJ Inject 25 mg into the skin every Sunday.   03/19/2022   naloxone (NARCAN)  nasal spray 4 mg/0.1 mL Place 0.4 mg into the nose once.   unknown   nystatin (MYCOSTATIN) 100000 UNIT/ML suspension Use as directed 5 mLs in the mouth or throat 4 (four) times daily.   Past Week   pantoprazole (PROTONIX) 40 MG tablet Take 40 mg by mouth daily.   03/26/2022   Potassium Chloride Crys ER (K-DUR PO) Take 40 mEq by mouth every other day.   unknown   predniSONE (DELTASONE) 10 MG tablet Take 4 tabs (40mg ) daily for 2 days, then reduce to your home dose of 5mg  daily (Patient taking differently: Take 5 mg by mouth daily with breakfast.) 30 tablet 0 03/26/2022   rosuvastatin (CRESTOR) 10 MG tablet Take 10 mg by mouth at bedtime.   03/25/2022   sulfamethoxazole-trimethoprim (BACTRIM) 400-80 MG tablet Take 1 tablet by mouth 3 (three) times a week. Monday,Wednesday and Friday   03/24/2022   tapentadol HCl (NUCYNTA) 75 MG tablet Take 75 mg by mouth. Take 1 tablet in the morning, 1 tablet in the afternoon and 2 tablets at bedtime   03/26/2022   topiramate (TOPAMAX) 100 MG tablet Take 100 mg by mouth daily.   03/26/2022   vitamin B-12 (CYANOCOBALAMIN) 1000 MCG tablet Take 1,000 mcg by mouth daily.   unknown   Vitamin D, Ergocalciferol, (DRISDOL) 50000 units CAPS capsule Take 50,000 Units by mouth every Friday.   03/24/2022   oxyCODONE 10 MG TABS Take 1 tablet (10 mg total) by mouth every 4 (four) hours as needed for severe pain or breakthrough pain. (Patient not taking: Reported on 03/26/2022) 30 tablet 0 Not Taking   senna (SENOKOT) 8.6 MG TABS tablet Take 2 tablets (17.2 mg total) by mouth 2 (two) times daily. (Patient not taking: Reported on 03/26/2022) 30 tablet 0 Not Taking    Assessment: Pharmacy consulted to dose heparin in patient with pulmonary embolism, confirmed by CT.  Patient is not on anticoagulation prior to admission. Last dose lovenox 40 mg on 6/5 1419.  Heparin level came back slightly subtherapeutic tonight. We will increase rate and check level in AM.    Goal of Therapy:  Heparin level  0.3-0.7 units/ml Monitor platelets by anticoagulation protocol: Yes   Plan:   Increase heparin infusion 1300 units/hr. Daily CBC and heparin level F/u transition to oral anticoagulation   05/26/2022, PharmD, BCIDP, AAHIVP, CPP Infectious Disease Pharmacist 03/30/2022 9:32 PM

## 2022-03-30 NOTE — Anesthesia Procedure Notes (Signed)
Procedure Name: Intubation Date/Time: 03/30/2022 12:15 PM  Performed by: Griffin Dakin, CRNAPre-anesthesia Checklist: Patient identified, Emergency Drugs available, Suction available and Patient being monitored Patient Re-evaluated:Patient Re-evaluated prior to induction Oxygen Delivery Method: Circle system utilized Preoxygenation: Pre-oxygenation with 100% oxygen Induction Type: IV induction Ventilation: Mask ventilation without difficulty Laryngoscope Size: Mac and 4 Grade View: Grade II Tube type: Oral Tube size: 8.0 mm Number of attempts: 1 Airway Equipment and Method: Stylet and Oral airway Placement Confirmation: ETT inserted through vocal cords under direct vision, positive ETCO2 and breath sounds checked- equal and bilateral Secured at: 21 cm Tube secured with: Tape Dental Injury: Teeth and Oropharynx as per pre-operative assessment  Comments: Attempt to place 8.5 ETT but unable to pass through cords due to size, 8.0 ETT able to pass without incident.

## 2022-03-30 NOTE — Progress Notes (Signed)
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  Diagnosed with sepsis due to pneumonia and transferred to [facility] for further care.   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  Monitor final cultures, speech therapy following, there could also be element of chronic microaspiration due to oversedation from her home medications, speech therapy on board.  Patient counseled along with husband on avoiding over sedation.  Acute pulmonary embolus (HCC) - 6/5 CTA chest--Small pulmonary emboli in RIGHT lung, stable echocardiogram with EF 55% and no RV strain.  Currently on heparin drip.  Likely can be switched to oral anticoagulation if pulmonary decides she is safe in the light of stable bronchoscopy. Will check Leg [place].  Granulomatosis with polyangiitis [facility]) - Patient follows with rheumatology at [facility], Dr. Allean Found, She is on methotrexate every Sunday, Rituximab currently on hold secondary to recent sepsis. She is chronically on prednisone 5 mg daily currently on 20 mg daily.  Monitor closely .  Chronic iron deficiency anemia.  Check anemia panel, PPI, outpatient age-appropriate work-up by PCP.  Monitor. Agreeable for transfusion if  needed.  Toxic and metabolic encephalopathy.  Due to combination of #1 above and high-dose narcotics and multiple sedatives being on board.  She has been counseled not to overuse narcotics as risk of respiratory failure and intubation is high, minimize offending medications, as needed Narcan, head CT is negative and she has no focal deficits.  Mentation much improved on 03/30/2022 with adjusted medications.  Opioid dependence ([place])  - Patient has intrathecal pump with bupivacaine and ziconotide, Last refill 03/02/2022, Patient receives Nucynta 75 mg, #120; last refill 03/17/2022,  Patient is also on oxycodone 10 mg; #30, last refilled 02/23/2022.   Chemotherapy-induced peripheral neuropathy ([place]) - Patient is on gabapentin, Cymbalta, amitriptyline topiramate, trazodone, medications adjusted to reduce encephalopathy and sedation.  Presence of intrathecal pump - Patient has intrathecal pump with bupivacaine and ziconotide, Last refill 03/02/2022.  Stage 3a chronic kidney disease (HCC) Baseline creatinine 0.8-1.1, Monitor with serial BMP.  UTI (urinary tract infection) - UA > 50 WBC, culture  E.coli, continue merrem on 03/29/2022 then per ID.  Class 2 obesity- BMI 35.04, with PCP for weight loss    Family Communication:     Husband Antony Haste [phone] on 03/29/2022 at 11:11 AM, 03/30/2022  Consultants:  Pulmonary, ID  Code Status:  FULL   DVT Prophylaxis:  IV Heparin   Procedures:  Bronchoscopy 03/30/2022.    Leg [place] -   CTA - Small pulmonary emboli in RIGHT lung. Small BILATERAL pleural effusions. Patchy BILATERAL pulmonary infiltrates consistent with multifocal pneumonia greatest in RIGHT upper lobe.  TTE - 1. Left ventricular ejection fraction, by estimation, is 55%. The left ventricle has normal function. The left ventricle has no regional wall motion abnormalities. Left ventricular diastolic parameters  were normal.  2. Right ventricular systolic function is normal. The right ventricular size is  normal. There is normal pulmonary artery systolic pressure.  3. The pericardial effusion is anterior to the right ventricle.  4. The mitral valve is abnormal. Trivial mitral valve regurgitation. No evidence of mitral stenosis.  5. The aortic valve is tricuspid. There is mild calcification of the aortic valve. Aortic valve regurgitation is not visualized. Aortic valve sclerosis is present, with no evidence of aortic valve stenosis.  6. The inferior vena cava is normal in size with greater than 50% respiratory variability, suggesting right atrial pressure of 3 mmHg.   Subjective:  Patient in bed comfortable looking at the hospital menu, awake alert, no headache chest or abdominal pain, overall feels a whole lot better.  Objective: Vitals:   03/29/22 2355 03/30/22 0401 03/30/22 0722 03/30/22 0904  BP: 131/61 (!) 142/83 (!) 145/88   Pulse: 82 79 82 84  Resp: 19 18 17 18   Temp: 98.4 F (36.9 C) 98.4 F (36.9 C) 100.3 F (37.9 C)   TempSrc: Oral Axillary Oral   SpO2: 92% 92% 100% 95%  Weight:      Height:        Intake/Output Summary (Last 24 hours) at 03/30/2022 0917 Last data filed at 03/30/2022 0600 Gross per 24 hour  Intake 1042.16 ml  Output 3 ml  Net 1039.16 ml   Weight change:   Exam:  Awake Alert, No new F.N deficits, Normal affect [place].AT,PERRAL Supple Neck, No JVD,   Symmetrical Chest wall movement, Good air movement bilaterally, CTAB RRR,No Gallops, Rubs or new Murmurs,  +ve B.Sounds, Abd Soft, No tenderness,   Bilateral transmetatarsal amputations   Data Reviewed:  Recent Labs  Lab 03/26/22 1007 03/27/22 0535 03/28/22 0402 03/29/22 0142 03/29/22 0622 03/30/22 0040  WBC 13.3* 17.0* 13.8* 11.0* 8.9 9.7  HGB 9.8* 8.5* 7.8* 9.2* 8.2* 9.0*  HCT 33.4* 29.4* 26.6* 31.1* 28.1* 30.9*  PLT 247 210 228 272 246 287  MCV 87.2 88.6 87.5 86.1 86.2 87.5  MCH 25.6* 25.6* 25.7* 25.5* 25.2* 25.5*  MCHC 29.3* 28.9* 29.3* 29.6* 29.2* 29.1*  RDW 22.2* 22.3* 22.4* 22.5* 22.5*  22.5*  LYMPHSABS 2.6  --   --   --   --  1.4  MONOABS 0.8  --   --   --   --  0.5  EOSABS 0.1  --   --   --   --  0.0  BASOSABS 0.0  --   --   --   --  0.0    Recent Labs  Lab 03/26/22 1005 03/26/22 1007 03/26/22 1027 03/26/22 1156 03/27/22 0535 03/28/22 0402 03/29/22 0622 03/30/22 0040  NA  --  141  --   --  143 141 140 139  K  --  3.6  --   --  3.8 3.1* 3.6 3.5  CL  --  114*  --   --  122* 121* 110 112*  CO2  --  22  --   --  20* 18* 21* 20*  GLUCOSE  --  140*  --   --  118* 148* 124* 166*  BUN  --  12  --   --  14 11 7 8   CREATININE  --  1.11*  --   --  0.86 0.69 0.81 0.93  CALCIUM  --  7.9*  --   --  7.2* 7.3* 8.0* 8.2*  AST  --  27  --   --  23  --  19 18  ALT  --  18  --   --  17  --  17 19  ALKPHOS  --  87  --   --  67  --  69 80  BILITOT  --  0.4  --   --  0.6  --  0.5 0.4  ALBUMIN  --  2.6*  --   --  2.4*  --  2.4* 2.4*  MG  --   --   --   --   --  2.0 1.9 2.0  CRP  --   --   --   --   --   --  5.1* 3.3*  PROCALCITON  --   --  0.32  --  0.74 0.50  --   --   LATICACIDVEN  --  2.0*  --  1.3  --   --   --   --   INR  --  1.1  --   --   --   --   --   --   HGBA1C  --   --  8.6*  --   --   --   --   --   BNP 243.0*  --   --   --   --   --   --  112.8*    Scheduled Meds:  amitriptyline  50 mg Oral QHS   budesonide (PULMICORT) nebulizer solution  0.5 mg Nebulization BID   Chlorhexidine Gluconate Cloth  6 each Topical Daily   DULoxetine  30 mg Oral Daily   folic acid  2 mg Oral Daily   gabapentin  600 mg Oral TID   insulin aspart  0-9 Units Subcutaneous TID WC   ipratropium-albuterol  3 mL Nebulization BID   linaclotide  145 mcg Oral QAC breakfast   pantoprazole  40 mg Oral Daily   potassium chloride  40 mEq Oral Once   predniSONE  20 mg Oral Q breakfast   rosuvastatin  10 mg Oral QHS   sulfamethoxazole-trimethoprim  1 tablet Oral Once per day on Mon Wed Fri   vitamin B-12  1,000 mcg Oral Daily   Continuous Infusions:  heparin 1,100 Units/hr (03/30/22 0600)    meropenem (MERREM) IV 200 mL/hr at 03/30/22 0600   vancomycin Stopped (03/30/22 0042)    Procedures/Studies: DG Chest Port 1 View  Result Date: 03/29/2022 CLINICAL DATA:  Shortness of breath and cough. EXAM: PORTABLE CHEST 1 VIEW COMPARISON:  03/26/2022 FINDINGS: Stable cardiomediastinal contours. Lungs are suboptimally inflated. No signs of pleural effusion or edema. Persistent asymmetric airspace opacification within the right upper lobe. IMPRESSION: Persistent asymmetric airspace opacification within the right upper lobe compatible with pneumonia. Electronically Signed   By: Kerby Moors M.D.   On: 03/29/2022 06:43   ECHOCARDIOGRAM COMPLETE  Result Date: 03/28/2022    ECHOCARDIOGRAM REPORT   Patient Name:   Makayia Astarita Date of Exam: 03/28/2022 Medical Rec #:  [id number]   Height:       64.0 in Accession #:    [id number]  Weight:       211.4 lb Date of Birth:  04/15/1966   BSA:          2.003 m Patient Age:    83 years    BP:           121/84 mmHg Patient Gender: F           HR:  86 bpm. Exam Location:  [facility] Procedure: 2D Echo, Cardiac Doppler and Color Doppler Indications:    Pulmonary embolus  History:        Patient has no prior history of Echocardiogram examinations.                 Risk Factors:Hypertension and Dyslipidemia.  Sonographer:    Wenda Low Referring Phys: [phone] DAVID TAT IMPRESSIONS  1. Left ventricular ejection fraction, by estimation, is 55%. The left ventricle has normal function. The left ventricle has no regional wall motion abnormalities. Left ventricular diastolic parameters were normal.  2. Right ventricular systolic function is normal. The right ventricular size is normal. There is normal pulmonary artery systolic pressure.  3. The pericardial effusion is anterior to the right ventricle.  4. The mitral valve is abnormal. Trivial mitral valve regurgitation. No evidence of mitral stenosis.  5. The aortic valve is tricuspid. There is mild calcification of the  aortic valve. Aortic valve regurgitation is not visualized. Aortic valve sclerosis is present, with no evidence of aortic valve stenosis.  6. The inferior vena cava is normal in size with greater than 50% respiratory variability, suggesting right atrial pressure of 3 mmHg. FINDINGS  Left Ventricle: Left ventricular ejection fraction, by estimation, is 55%. The left ventricle has normal function. The left ventricle has no regional wall motion abnormalities. The left ventricular internal cavity size was normal in size. There is no left ventricular hypertrophy. Left ventricular diastolic parameters were normal. Right Ventricle: The right ventricular size is normal. No increase in right ventricular wall thickness. Right ventricular systolic function is normal. There is normal pulmonary artery systolic pressure. The tricuspid regurgitant velocity is 1.76 m/s, and  with an assumed right atrial pressure of 3 mmHg, the estimated right ventricular systolic pressure is 123XX123 mmHg. Left Atrium: Left atrial size was normal in size. Right Atrium: Right atrial size was normal in size. Pericardium: Trivial pericardial effusion is present. The pericardial effusion is anterior to the right ventricle. Mitral Valve: The mitral valve is abnormal. There is mild thickening of the mitral valve leaflet(s). There is mild calcification of the mitral valve leaflet(s). Trivial mitral valve regurgitation. No evidence of mitral valve stenosis. MV peak gradient, 6.5 mmHg. The mean mitral valve gradient is 3.0 mmHg. Tricuspid Valve: The tricuspid valve is normal in structure. Tricuspid valve regurgitation is trivial. No evidence of tricuspid stenosis. Aortic Valve: The aortic valve is tricuspid. There is mild calcification of the aortic valve. Aortic valve regurgitation is not visualized. Aortic valve sclerosis is present, with no evidence of aortic valve stenosis. Aortic valve mean gradient measures 5.0 mmHg. Aortic valve peak gradient measures  10.8 mmHg. Aortic valve area, by VTI measures 2.22 cm. Pulmonic Valve: The pulmonic valve was normal in structure. Pulmonic valve regurgitation is not visualized. No evidence of pulmonic stenosis. Aorta: The aortic root is normal in size and structure. Venous: The inferior vena cava is normal in size with greater than 50% respiratory variability, suggesting right atrial pressure of 3 mmHg. IAS/Shunts: No atrial level shunt detected by color flow Doppler.  LEFT VENTRICLE PLAX 2D LVIDd:         4.90 cm     Diastology LVIDs:         3.50 cm     LV e' medial:    7.72 cm/s LV PW:         1.00 cm     LV E/e' medial:  15.2 LV IVS:  0.90 cm     LV e' lateral:   14.60 cm/s LVOT diam:     2.00 cm     LV E/e' lateral: 8.0 LV SV:         76 LV SV Index:   38 LVOT Area:     3.14 cm  LV Volumes (MOD) LV vol d, MOD A2C: 60.2 ml LV vol d, MOD A4C: 49.4 ml LV vol s, MOD A2C: 24.4 ml LV vol s, MOD A4C: 23.8 ml LV SV MOD A2C:     35.8 ml LV SV MOD A4C:     49.4 ml LV SV MOD BP:      30.8 ml RIGHT VENTRICLE RV Basal diam:  3.15 cm RV Mid diam:    2.80 cm RV S prime:     13.40 cm/s TAPSE (M-mode): 2.3 cm LEFT ATRIUM             Index        RIGHT ATRIUM           Index LA diam:        3.70 cm 1.85 cm/m   RA Area:     15.50 cm LA Vol (A2C):   46.0 ml 22.96 ml/m  RA Volume:   37.70 ml  18.82 ml/m LA Vol (A4C):   50.9 ml 25.41 ml/m LA Biplane Vol: 50.3 ml 25.11 ml/m  AORTIC VALVE                     PULMONIC VALVE AV Area (Vmax):    2.38 cm      PV Vmax:       0.90 m/s AV Area (Vmean):   2.30 cm      PV Peak grad:  3.2 mmHg AV Area (VTI):     2.22 cm AV Vmax:           164.00 cm/s AV Vmean:          107.000 cm/s AV VTI:            0.344 m AV Peak Grad:      10.8 mmHg AV Mean Grad:      5.0 mmHg LVOT Vmax:         124.00 cm/s LVOT Vmean:        78.500 cm/s LVOT VTI:          0.243 m LVOT/AV VTI ratio: 0.71  AORTA Ao Root diam: 2.90 cm MITRAL VALVE                TRICUSPID VALVE MV Area (PHT): 3.68 cm     TR Peak grad:    12.4 mmHg MV Area VTI:   2.13 cm     TR Vmax:        176.00 cm/s MV Peak grad:  6.5 mmHg MV Mean grad:  3.0 mmHg     SHUNTS MV Vmax:       1.27 m/s     Systemic VTI:  0.24 m MV Vmean:      77.6 cm/s    Systemic Diam: 2.00 cm MV Decel Time: 206 msec MV E velocity: 117.00 cm/s MV A velocity: 89.50 cm/s MV E/A ratio:  1.31 Jenkins Rouge MD Electronically signed by Jenkins Rouge MD Signature Date/Time: 03/28/2022/12:20:15 PM    Final    CT Angio Chest Pulmonary Embolism (PE) W or WO Contrast  Result Date: 03/27/2022 CLINICAL DATA:  Chest pain and shortness of breath for several weeks, recent  pneumonia EXAM: CT ANGIOGRAPHY CHEST WITH CONTRAST TECHNIQUE: Multidetector CT imaging of the chest was performed using the standard protocol during bolus administration of intravenous contrast. Multiplanar CT image reconstructions and MIPs were obtained to evaluate the vascular anatomy. RADIATION DOSE REDUCTION: This exam was performed according to the departmental dose-optimization program which includes automated exposure control, adjustment of the mA and/or kV according to patient size and/or use of iterative reconstruction technique. CONTRAST:  32mL OMNIPAQUE IOHEXOL 300 MG/ML  SOLN IV COMPARISON:  CT chest 01/04/2022 FINDINGS: Cardiovascular: Aorta normal caliber without aneurysm or dissection. Proximal great vessels unremarkable. Heart normal appearance. No pericardial effusion. Pulmonary arteries adequately opacified. Two small filling defects are identified consistent with pulmonary embolism, in RIGHT lower lobe image 214 and in RIGHT middle lobe image 160. No additional emboli. Mediastinum/Nodes: Esophagus unremarkable. Few normal sized mediastinal lymph nodes. No thoracic adenopathy. Base of cervical region normal appearance. Lungs/Pleura: Small BILATERAL pleural effusions. BILATERAL pulmonary infiltrates, greatest in RIGHT upper lobe. No pneumothorax or mass. Upper Abdomen: Gallbladder surgically absent. Splenic  deformity question related to prior infarct or surgery. Musculoskeletal: No acute osseous findings. Review of the MIP images confirms the above findings. IMPRESSION: Small pulmonary emboli in RIGHT lung. Small BILATERAL pleural effusions. Patchy BILATERAL pulmonary infiltrates consistent with multifocal pneumonia greatest in RIGHT upper lobe. Critical Value/emergent results were called by telephone at the time of interpretation on 03/27/2022 at 1510 hrs to provider DAVID TAT MD, who verbally acknowledged these results. Electronically Signed   By: Lavonia Dana M.D.   On: 03/27/2022 15:13   CT Head Wo Contrast  Result Date: 03/26/2022 CLINICAL DATA:  Altered mental status.  Recent stroke. EXAM: CT HEAD WITHOUT CONTRAST TECHNIQUE: Contiguous axial images were obtained from the base of the skull through the vertex without intravenous contrast. RADIATION DOSE REDUCTION: This exam was performed according to the departmental dose-optimization program which includes automated exposure control, adjustment of the mA and/or kV according to patient size and/or use of iterative reconstruction technique. COMPARISON:  11/01/2020 FINDINGS: Brain: No evidence of intracranial hemorrhage, acute infarction, hydrocephalus, extra-axial collection, or mass lesion/mass effect. 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 Vascular:  No hyperdense vessel or other acute findings. Skull: No evidence of fracture or other significant bone abnormality. Sinuses/Orbits:  No acute findings. Other: None. IMPRESSION: No acute intracranial abnormality. Electronically Signed   By: Marlaine Hind M.D.   On: 03/26/2022 11:42   DG Chest Port 1 View  Result Date: 03/26/2022 CLINICAL DATA:  Sepsis. EXAM: PORTABLE CHEST 1 VIEW COMPARISON:  February 19, 2022 FINDINGS: There is infiltrate in the right mid lung. There may be infiltrate in the medial left upper lung. No pneumothorax. No other  acute interval changes. The study is limited however due to portable technique. IMPRESSION: The study is limited due to low volume portable technique. However, there is an infiltrate in the right mid lung consistent with for pneumonia given history. There may be a more subtle smaller infiltrate in the medial left upper lung. Electronically Signed   By: Dorise Bullion III M.D.   On: 03/26/2022 10:22     LOS: 4 days   Signature  Lala Lund M.D on 03/30/2022 at 9:17 AM   -  To page go to [email or web address]

## 2022-03-30 NOTE — Op Note (Signed)
[facility] Cardiopulmonary Patient Name: [name] Date: 03/30/2022 MRN: [id number] Attending MD: Laurin Coder MD, MD Date of Birth: 1966/01/31 CSN: Finalized Age: 56 Admit Type: Inpatient Gender: Female Procedure:             Bronchoscopy Indications:           Immune compromised with bacterial pneumonia Providers:             Zakarie Sturdivant A. Ander Slade MD, MD, Jaci Carrel, RN, Frazier Richards, Technician Referring MD:           Medicines:             General Anesthesia, See the Anesthesia note for                         documentation of the administered medications Complications:         No immediate complications Estimated Blood Loss:  Estimated blood loss was minimal. Procedure:             Pre-Anesthesia Assessment:                        - The risks and benefits of the procedure and the                         sedation options and risks were discussed with the                         patient. All questions were answered and informed                         consent was obtained.                        After obtaining informed consent, the bronchoscope was                         passed under direct vision. Throughout the procedure,                         the patient's blood pressure, pulse, and oxygen                         saturations were monitored continuously. the [id number]                         ([id number]) Olympus bronchoscope was introduced through                         the mouth, via the endotracheal tube (the patient was                         intubated for the procedure) and advanced to the                         tracheobronchial tree of both lungs. The procedure was  accomplished without difficulty. The patient tolerated                         the procedure well. The procedure was accomplished                         without difficulty. The patient tolerated the                          procedure well. Scope In: Scope Out: Findings:      Bronchoalveolar lavage was performed in the right upper lobe of the lung       and sent for cell count and differential and bacterial, AFB and fungal       analysis. 160 mL of fluid were instilled. 60 mL were returned. The       return was blood-tinged. Mucous plugs were present in the return fluid.      Transbronchial biopsies of an area of infiltration were performed in the       right upper lobe using forceps and sent for histopathology examination.       Biopsy of lung tissue was obtained. Four biopsy passes were performed.       Four biopsy samples were obtained. Impression:            - Immune compromised with bacterial pneumonia                        - Bronchoalveolar lavage was performed. Moderate Sedation:      An independent trained observer was present and continuously monitored       the patient. Recommendation:        - Await BAL and biopsy results. Procedure Code(s):     --- Professional ---                        [phone], Bronchoscopy, rigid or flexible, including                         fluoroscopic guidance, when performed; with                         transbronchial lung biopsy(s), single lobe                        03546, Bronchoscopy, rigid or flexible, including                         fluoroscopic guidance, when performed; with bronchial                         alveolar lavage Diagnosis Code(s):     --- Professional ---                        D84.9, Immunodeficiency, unspecified                        J15.9, Unspecified bacterial pneumonia CPT copyright 2019 American Medical Association. All rights reserved. The codes documented in this report are preliminary and upon coder review may  be revised to meet current compliance requirements. Sherrilyn Rist, MD Laurin Coder MD, MD 03/30/2022 1:02:27 PM This report has  been signed electronically. Number of Addenda: 0

## 2022-03-30 NOTE — Progress Notes (Signed)
Patient encourage to use incentive spirometer

## 2022-03-30 NOTE — Anesthesia Preprocedure Evaluation (Addendum)
Anesthesia Evaluation  Patient identified by MRN, date of birth, ID band Patient awake    Reviewed: Allergy & Precautions, NPO status , Patient's Chart, lab work & pertinent test results  Airway Mallampati: III  TM Distance: >3 FB Neck ROM: Full    Dental no notable dental hx. (+) Dental Advisory Given   Pulmonary sleep apnea and Continuous Positive Airway Pressure Ventilation , pneumonia,  Wegeners granulomatosis   breath sounds clear to auscultation       Cardiovascular hypertension,  Rhythm:Regular Rate:Normal     Neuro/Psych  Neuromuscular disease    GI/Hepatic Neg liver ROS, GERD  ,  Endo/Other  diabetes, Type 2Hypothyroidism   Renal/GU Renal disease     Musculoskeletal  (+) Arthritis ,   Abdominal   Peds  Hematology  (+) Blood dyscrasia, anemia ,   Anesthesia Other Findings   Reproductive/Obstetrics                            Anesthesia Physical Anesthesia Plan  ASA: 3  Anesthesia Plan: General   Post-op Pain Management: Minimal or no pain anticipated and Tylenol PO (pre-op)*   Induction: Intravenous  PONV Risk Score and Plan: 3 and Dexamethasone and Treatment may vary due to age or medical condition  Airway Management Planned: Oral ETT  Additional Equipment: None  Intra-op Plan:   Post-operative Plan: Extubation in OR  Informed Consent: I have reviewed the patients History and Physical, chart, labs and discussed the procedure including the risks, benefits and alternatives for the proposed anesthesia with the patient or authorized representative who has indicated his/her understanding and acceptance.     Dental advisory given  Plan Discussed with: CRNA and Anesthesiologist  Anesthesia Plan Comments:        Anesthesia Quick Evaluation

## 2022-03-30 NOTE — Progress Notes (Signed)
Bilateral venous duplex study completed. Please see CV Proc for preliminary results.  [name]  Severiano Utsey BS, RVT 03/30/2022 4:32 PM

## 2022-03-30 NOTE — Anesthesia Postprocedure Evaluation (Signed)
Anesthesia Post Note  Patient: [name]  Procedure(s) Performed: VIDEO BRONCHOSCOPY WITH FLUORO (Right) BRONCHIAL WASHINGS BIOPSY     Patient location during evaluation: PACU Anesthesia Type: General Level of consciousness: sedated Pain management: pain level controlled Vital Signs Assessment: post-procedure vital signs reviewed and stable Respiratory status: spontaneous breathing and respiratory function stable Cardiovascular status: stable Postop Assessment: no apparent nausea or vomiting Anesthetic complications: no   No notable events documented.  Last Vitals:  Vitals:   03/30/22 1330 03/30/22 1343  BP: 119/81 124/80  Pulse: 85 86  Resp: 19 18  Temp: 37.4 C 37.2 C  SpO2: 98% 94%    Last Pain:  Vitals:   03/30/22 1343  TempSrc: Oral  PainSc: 7                  Shaquina Gillham DANIEL

## 2022-03-31 ENCOUNTER — Inpatient Hospital Stay: Payer: Self-pay

## 2022-03-31 ENCOUNTER — Other Ambulatory Visit (HOSPITAL_COMMUNITY): Payer: Self-pay

## 2022-03-31 ENCOUNTER — Encounter (HOSPITAL_COMMUNITY): Payer: Self-pay | Admitting: Pulmonary Disease

## 2022-03-31 DIAGNOSIS — R6520 Severe sepsis without septic shock: Secondary | ICD-10-CM | POA: Diagnosis not present

## 2022-03-31 DIAGNOSIS — M313 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: Secondary | ICD-10-CM | POA: Diagnosis not present

## 2022-03-31 DIAGNOSIS — A419 Sepsis, unspecified organism: Secondary | ICD-10-CM | POA: Diagnosis not present

## 2022-03-31 DIAGNOSIS — Z8701 Personal history of pneumonia (recurrent): Secondary | ICD-10-CM | POA: Diagnosis not present

## 2022-03-31 LAB — CULTURE, BLOOD (ROUTINE X 2)
Culture: NO GROWTH
Culture: NO GROWTH
Special Requests: ADEQUATE
Special Requests: ADEQUATE

## 2022-03-31 LAB — CBC WITH DIFFERENTIAL/PLATELET
Abs Immature Granulocytes: 0.64 10*3/uL — ABNORMAL HIGH (ref 0.00–0.07)
Basophils Absolute: 0.1 10*3/uL (ref 0.0–0.1)
Basophils Relative: 1 %
Eosinophils Absolute: 0 10*3/uL (ref 0.0–0.5)
Eosinophils Relative: 0 %
HCT: 33.4 % — ABNORMAL LOW (ref 36.0–46.0)
Hemoglobin: 9.7 g/dL — ABNORMAL LOW (ref 12.0–15.0)
Immature Granulocytes: 10 %
Lymphocytes Relative: 26 %
Lymphs Abs: 1.6 10*3/uL (ref 0.7–4.0)
MCH: 25.2 pg — ABNORMAL LOW (ref 26.0–34.0)
MCHC: 29 g/dL — ABNORMAL LOW (ref 30.0–36.0)
MCV: 86.8 fL (ref 80.0–100.0)
Monocytes Absolute: 0.4 10*3/uL (ref 0.1–1.0)
Monocytes Relative: 6 %
Neutro Abs: 3.7 10*3/uL (ref 1.7–7.7)
Neutrophils Relative %: 57 %
Platelets: 310 10*3/uL (ref 150–400)
RBC: 3.85 MIL/uL — ABNORMAL LOW (ref 3.87–5.11)
RDW: 22.6 % — ABNORMAL HIGH (ref 11.5–15.5)
Smear Review: NORMAL
WBC: 6.3 10*3/uL (ref 4.0–10.5)
nRBC: 0.3 % — ABNORMAL HIGH (ref 0.0–0.2)

## 2022-03-31 LAB — BRAIN NATRIURETIC PEPTIDE: B Natriuretic Peptide: 41.9 pg/mL (ref 0.0–100.0)

## 2022-03-31 LAB — COMPREHENSIVE METABOLIC PANEL
ALT: 19 U/L (ref 0–44)
AST: 18 U/L (ref 15–41)
Albumin: 2.6 g/dL — ABNORMAL LOW (ref 3.5–5.0)
Alkaline Phosphatase: 73 U/L (ref 38–126)
Anion gap: 9 (ref 5–15)
BUN: 10 mg/dL (ref 6–20)
CO2: 22 mmol/L (ref 22–32)
Calcium: 8.8 mg/dL — ABNORMAL LOW (ref 8.9–10.3)
Chloride: 111 mmol/L (ref 98–111)
Creatinine, Ser: 0.76 mg/dL (ref 0.44–1.00)
GFR, Estimated: >60 mL/min (ref >60)
Glucose, Bld: 145 mg/dL — ABNORMAL HIGH (ref 70–99)
Potassium: 3.5 mmol/L (ref 3.5–5.1)
Sodium: 142 mmol/L (ref 135–145)
Total Bilirubin: 0.3 mg/dL (ref 0.3–1.2)
Total Protein: 5.3 g/dL — ABNORMAL LOW (ref 6.5–8.1)

## 2022-03-31 LAB — CYTOLOGY - NON PAP

## 2022-03-31 LAB — ACID FAST SMEAR (AFB, MYCOBACTERIA): Acid Fast Smear: NEGATIVE

## 2022-03-31 LAB — GLUCOSE, CAPILLARY
Glucose-Capillary: 166 mg/dL — ABNORMAL HIGH (ref 70–99)
Glucose-Capillary: 127 mg/dL — ABNORMAL HIGH (ref 70–99)
Glucose-Capillary: 198 mg/dL — ABNORMAL HIGH (ref 70–99)
Glucose-Capillary: 205 mg/dL — ABNORMAL HIGH (ref 70–99)

## 2022-03-31 LAB — MAGNESIUM: Magnesium: 2.4 mg/dL (ref 1.7–2.4)

## 2022-03-31 LAB — C-REACTIVE PROTEIN: CRP: 2.7 mg/dL — ABNORMAL HIGH (ref <1.0)

## 2022-03-31 LAB — HEPARIN LEVEL (UNFRACTIONATED): Heparin Unfractionated: 0.55 IU/mL (ref 0.30–0.70)

## 2022-03-31 MED ORDER — SODIUM CHLORIDE 0.9% FLUSH
10.0000 mL | Freq: Two times a day (BID) | INTRAVENOUS | Status: DC
  Administered 2022-03-31 – 2022-04-04 (×6): 10 mL

## 2022-03-31 MED ORDER — APIXABAN 5 MG PO TABS: 5.0000 mg | ORAL_TABLET | Freq: Two times a day (BID) | ORAL | Status: DC

## 2022-03-31 MED ORDER — APIXABAN 5 MG PO TABS
10.0000 mg | ORAL_TABLET | Freq: Two times a day (BID) | ORAL | Status: DC
  Administered 2022-03-31 – 2022-04-05 (×11): 10 mg via ORAL
  Filled 2022-03-31 (×11): qty 2

## 2022-03-31 MED ORDER — POTASSIUM CHLORIDE CRYS ER 20 MEQ PO TBCR
40.0000 meq | EXTENDED_RELEASE_TABLET | Freq: Once | ORAL | Status: AC
  Administered 2022-03-31: 40 meq via ORAL
  Filled 2022-03-31: qty 2

## 2022-03-31 MED ORDER — SODIUM CHLORIDE 0.9% FLUSH
10.0000 mL | INTRAVENOUS | Status: DC | PRN
  Administered 2022-04-05: 10 mL

## 2022-03-31 NOTE — TOC Progression Note (Signed)
Transition of Care (TOC) - Progression Note  Donn Pierini RN, BSN Transitions of Care Unit 4E- RN Case Manager See Treatment Team for direct phone #    Patient Details  Name: [name] MRN: [id number] Date of Birth: [birth year]  Transition of Care [facility]) CM/SW Contact  Zenda Alpers, Lenn Sink, RN Phone Number: 03/31/2022, 1:56 PM  Clinical Narrative:    Received msg from charge RN- spouse called and requesting assistance in finding a New PCP- would like one in the [facility] network. Also note HH and DME orders have been placed.   CM went to room and spoke with pt (dad also present at the bedside). Patient confirmed she would like a new PCP and one in the [facility] if possible. She would also like one in the [place] area if possible, open to [place] is no PCP in [place].  Pt also requesting assistance in finding a Rheumatologist.   Confirmed pt as needed DME at home including RW- no DME needs noted at this time. Family will transport home.   Request sent to [facility] CMA for PCP appointment assistance.  New PCP appointment found with [facility] for June 16 at 11:00.  Pt provided info on appointment as well as info to call office prior to appointment.  Pt also provided list of Rheumatologist in [facility] as well as list of Family Medicine providers in [place] should she want to change appointment that has been made.  Also provided pt with [facility] Website and how to search for providers for future use.   Discussed with pt HH needs- pt was recently active with [facility] for [facility], choice offered Per CMS guidelines, pt voiced she would like to stay with [facility] services again.   Call made to [facility] for [facility] referral- referral has been accepted.   Expected Discharge Plan: Home w Home Health Services Barriers to Discharge: Continued Medical Work up  Expected Discharge Plan and Services Expected Discharge Plan: Home w Home Health Services In-house Referral: Clinical  Social Work Discharge Planning Services: CM Consult Post Acute Care Choice: Home Health, Durable Medical Equipment Living arrangements for the past 2 months: Single Family Home                 DME Arranged: Walker rolling (pt confirmed she already has one) DME Agency: NA       HH Arranged: RN, PT, Nurse's Aide, Speech Therapy HH Agency: [facility] Date [facility] Agency Contacted: 03/31/22 Time HH Agency Contacted: 1355 Representative spoke with at [facility] Agency: Kandee Keen   Social Determinants of Health (SDOH) Interventions    Readmission Risk Interventions    03/27/2022   10:12 AM 02/20/2022    1:49 PM  Readmission Risk Prevention Plan  Transportation Screening Complete Complete  HRI or Home Care Consult  Complete  Social Work Consult for Recovery Care Planning/Counseling  Complete  Palliative Care Screening  Not Applicable  Medication Review Oceanographer) Complete Complete  HRI or Home Care Consult Complete   SW Recovery Care/Counseling Consult Complete   Palliative Care Screening Not Applicable   Skilled Nursing Facility Not Applicable

## 2022-03-31 NOTE — Progress Notes (Signed)
Peripherally Inserted Central Catheter Placement  The IV Nurse has discussed with the patient and/or persons authorized to consent for the patient, the purpose of this procedure and the potential benefits and risks involved with this procedure.  The benefits include less needle sticks, lab draws from the catheter, and the patient may be discharged home with the catheter. Risks include, but not limited to, infection, bleeding, blood clot (thrombus formation), and puncture of an artery; nerve damage and irregular heartbeat and possibility to perform a PICC exchange if needed/ordered by physician.  Alternatives to this procedure were also discussed.  Bard Power PICC patient education guide, fact sheet on infection prevention and patient information card has been provided to patient /or left at bedside.    PICC Placement Documentation  PICC Single Lumen 03/31/22 Right Cephalic 38 cm 0 cm (Active)  Indication for Insertion or Continuance of Line Home intravenous therapies (PICC only) 03/31/22 2110  Exposed Catheter (cm) 0 cm 03/31/22 2110  Site Assessment Clean, Dry, Intact 03/31/22 2110  Line Status Flushed;Saline locked;Blood return noted 03/31/22 2110  Dressing Type Transparent;Securing device 03/31/22 2110  Dressing Status Antimicrobial disc in place;Clean, Dry, Intact 03/31/22 2110  Dressing Intervention New dressing 03/31/22 2110  Dressing Change Due 04/07/22 03/31/22 2110       Curt Jews 03/31/2022, 9:35 PM

## 2022-03-31 NOTE — TOC Benefit Eligibility Note (Signed)
Patient Product/process development scientist completed.    The patient is currently admitted and upon discharge could be taking Eliquis 5 mg tablets.  The current 30 day co-pay is, $0.00.   The patient is insured through [employer]    [name], CPhT Pharmacy Patient Advocate Specialist [facility] Patient Advocate Team Direct Number: [phone]  Fax: [phone]

## 2022-03-31 NOTE — Progress Notes (Signed)
NAME:  [name], MRN:  [id number], DOB:  02/12/1966, LOS: 5 ADMISSION DATE:  03/26/2022, CONSULTATION DATE:  03/28/2022 REFERRING MD:  Dr Candiss Norse, CHIEF COMPLAINT:  hemoptysis   History of Present Illness:  56 yo female was brought to [facility] ER with AMS.  SpO2 in 70's on room air.  She was noted to have fever 104F, and hypotensive.  She was started on antibiotics for pneumonia and UTI.  CT chest showed small PEs.  She was started on heparin infusion.  She had persistent hemoptysis.  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  She had sputum culture from 02/21/22 that grew MRSA.     He had recent hospitalization for myositis (02/19/22 to 02/23/22) and treated with clindamycin and steroid taper.     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  She is followed by Dr. Valentino Nose with rheumatology and Dr. Dionne Milo with pulmonary.  Pertinent  Medical History   Past Medical History:  Diagnosis Date   Chronic cough    Chronic pain    Diabetes mellitus without complication (HCC)    Dyspnea    GERD (gastroesophageal reflux disease)    Hypokalemia    Hypothyroidism    Left wrist pain 06/01/2021   Myonecrosis ([place]) 07/01/2021   Neurocardiogenic syncope    OSA (obstructive sleep apnea)    does not use CPAP   Osteomyelitis (HCC)    bilateral feet   Peripheral neuropathy    Presence of intrathecal pump    recieves Prialt/bupivicaine   Tear of gluteus medius tendon 07/01/2021   Wegener's granulomatosis 2009   Wegner's disease (congenital syphilitic osteochondritis)      Significant Hospital Events: Including procedures, antibiotic start and stop dates in addition to other pertinent events   Bronchoscopy 03/30/2022   Interim History / Subjective:  Awake alert interactive Denies significant complaints at present Still has a cough, not really bringing up much secretions presently  Objective   Blood pressure 138/70, pulse  60, temperature 98.6 F (37 C), temperature source Oral, resp. rate 18, height 5\' 4"  (1.626 m), weight 91.4 kg, SpO2 99 %.        Intake/Output Summary (Last 24 hours) at 03/31/2022 0900 Last data filed at 03/31/2022 0000 Gross per 24 hour  Intake 344.89 ml  Output --  Net 344.89 ml   Filed Weights   03/28/22 0411 03/28/22 2319 03/30/22 1040  Weight: 95.9 kg 91.4 kg 91.4 kg    Examination: General: Middle-age lady, does not appear to be in distress at rest HENT: Moist oral mucosa Lungs: Few rales at the bases Cardiovascular: S1-S2 appreciated Abdomen: Bowel sounds appreciated Extremities: No clubbing, no edema Neuro: Alert and oriented x3 GU: [facility] Problem list     Assessment & Plan:  Recurrent pneumonia -On antibiotic therapy -Concern for immunocompromised pneumonia -S/p bronchoscopy-no organisms identified at present, rare budding yeast -Today will be day 6 of meropenem and vancomycin -Continues on Bactrim  History of Wegener's -Was on methotrexate, rituximab and prednisone -Currently on prednisone, Bactrim -Methotrexate on hold  Did have a pneumonia December 2022 Reportedly grew Mycobacterium gordonae from a bronchoscopy in March 2023  Pulmonary embolism -Was on heparin -Been transitioned to Eliquis  History of chronic pain 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 -Medications being adjusted -There was concern with lethargy  Type 2 diabetes GERD Hypothyroidism Steroid-induced adrenal insufficiency  Stage IIIa  chronic kidney disease Class II obesity  Follow-up on bronchoscopic sampling -Cultures requested to be held for nocardia, actinomyces -Ordered serum Fungitell    Best Practice (right click and "Reselect all SmartList Selections" daily)   Diet/type: Regular consistency (see orders) DVT prophylaxis: systemic heparin GI prophylaxis: PPI Lines: N/A Foley:  N/A Code Status:   full code Last date of multidisciplinary goals of care discussion [per primary]  Labs   CBC: Recent Labs  Lab 03/26/22 1007 03/27/22 0535 03/28/22 0402 03/29/22 0142 03/29/22 0622 03/30/22 0040 03/31/22 0751  WBC 13.3*   < > 13.8* 11.0* 8.9 9.7 6.3  NEUTROABS 9.6*  --   --   --   --  7.6 PENDING  HGB 9.8*   < > 7.8* 9.2* 8.2* 9.0* 9.7*  HCT 33.4*   < > 26.6* 31.1* 28.1* 30.9* 33.4*  MCV 87.2   < > 87.5 86.1 86.2 87.5 86.8  PLT 247   < > 228 272 246 287 310   < > = values in this interval not displayed.    Basic Metabolic Panel: Recent Labs  Lab 03/27/22 0535 03/28/22 0402 03/29/22 0622 03/30/22 0040 03/31/22 0751  NA 143 141 140 139 142  K 3.8 3.1* 3.6 3.5 3.5  CL 122* 121* 110 112* 111  CO2 20* 18* 21* 20* 22  GLUCOSE 118* 148* 124* 166* 145*  BUN 14 11 7 8 10   CREATININE 0.86 0.69 0.81 0.93 0.76  CALCIUM 7.2* 7.3* 8.0* 8.2* 8.8*  MG  --  2.0 1.9 2.0 2.4   GFR: Estimated Creatinine Clearance: 86 mL/min (by C-G formula based on SCr of 0.76 mg/dL). Recent Labs  Lab 03/26/22 1007 03/26/22 1027 03/26/22 1156 03/27/22 0535 03/28/22 0402 03/29/22 0142 03/29/22 0622 03/30/22 0040 03/31/22 0751  PROCALCITON  --  0.32  --  0.74 0.50  --   --   --   --   WBC 13.3*  --   --  17.0* 13.8* 11.0* 8.9 9.7 6.3  LATICACIDVEN 2.0*  --  1.3  --   --   --   --   --   --     Liver Function Tests: Recent Labs  Lab 03/26/22 1007 03/27/22 0535 03/29/22 0622 03/30/22 0040 03/31/22 0751  AST 27 23 19 18 18   ALT 18 17 17 19 19   ALKPHOS 87 67 69 80 73  BILITOT 0.4 0.6 0.5 0.4 0.3  PROT 5.1* 4.7* 5.0* 4.9* 5.3*  ALBUMIN 2.6* 2.4* 2.4* 2.4* 2.6*   No results for input(s): "LIPASE", "AMYLASE" in the last 168 hours. No results for input(s): "AMMONIA" in the last 168 hours.  ABG    Component Value Date/Time   HCO3 21.6 03/26/2022 1027   ACIDBASEDEF 4.8 (H) 03/26/2022 1027   O2SAT 68.5 03/26/2022 1027     Coagulation Profile: Recent Labs  Lab 03/26/22 1007  INR  1.1    Cardiac Enzymes: No results for input(s): "CKTOTAL", "CKMB", "CKMBINDEX", "TROPONINI" in the last 168 hours.  HbA1C: Hgb A1c MFr Bld  Date/Time Value Ref Range Status  03/26/2022 10:27 AM 8.6 (H) 4.8 - 5.6 % Final    Comment:    (NOTE) Pre diabetes:          5.7%-6.4%  Diabetes:              >6.4%  Glycemic control for   <7.0% adults with diabetes   03/24/2021 12:07 PM 5.9 (H) 4.8 - 5.6 % Final    Comment:    (  NOTE)         Prediabetes: 5.7 - 6.4         Diabetes: >6.4         Glycemic control for adults with diabetes: <7.0     CBG: Recent Labs  Lab 03/30/22 0612 03/30/22 1250 03/30/22 1649 03/30/22 2115 03/31/22 0622  GLUCAP 98 91 363* 256* 166*    Review of Systems:   Feels overall better  Past Medical History:  She,  has a past medical history of Chronic cough, Chronic pain, Diabetes mellitus without complication ([place]), Dyspnea, GERD (gastroesophageal reflux disease), Hypokalemia, Hypothyroidism, Left wrist pain (06/01/2021), Myonecrosis ([place]) (07/01/2021), Neurocardiogenic syncope, OSA (obstructive sleep apnea), Osteomyelitis ([place]), Peripheral neuropathy, Presence of intrathecal pump, Tear of gluteus medius tendon (07/01/2021), Wegener's granulomatosis (2009), and Wegner's disease (congenital syphilitic osteochondritis).   Surgical History:   Past Surgical History:  Procedure Laterality Date   AMPUTATION Right 10/21/2020   Procedure: Right hallux amputation;  Surgeon: Wylene Simmer, MD;  Location: [facility];  Service: Orthopedics;  Laterality: Right;   AMPUTATION TOE Bilateral 08/12/2020   Procedure: Right 3rd toe amputation; left hallux and 3rd toe amputation;  Surgeon: Wylene Simmer, MD;  Location: [place];  Service: Orthopedics;  Laterality: Bilateral;  51min   AMPUTATION TOE Right 03/24/2021   Procedure: AMPUTATION TOE;  Surgeon: Wylene Simmer, MD;  Location: [place];  Service: Orthopedics;  Laterality: Right;   APPENDECTOMY      BACK SURGERY     CHOLECYSTECTOMY     COLONOSCOPY N/A 06/02/2013   Procedure: COLONOSCOPY;  Surgeon: Wonda Horner, MD;  Location: [facility] ENDOSCOPY;  Service: Endoscopy;  Laterality: N/A;   HERNIA REPAIR  2000   INCISION AND DRAINAGE ABSCESS Left 07/07/2016   Procedure: DEBRIDMENT LEFT THIGH ABSCESS, EXISION ACUTE SKIN RASH LEFT THIGH(1CM LESION);  Surgeon: Fanny Skates, MD;  Location: [facility];  Service: General;  Laterality: Left;   SEPTOPLASTY  02/2013   spleen anuyism     splenic aneurysm     TRANSMETATARSAL AMPUTATION Bilateral 10/21/2020   Procedure: Left transmetatarsal amputation;  Surgeon: Wylene Simmer, MD;  Location: [place];  Service: Orthopedics;  Laterality: Bilateral;   TRANSMETATARSAL AMPUTATION Left 03/24/2021   Procedure: TRANSMETATARSAL AMPUTATION;  Surgeon: Wylene Simmer, MD;  Location: [facility];  Service: Orthopedics;  Laterality: Left;   VAGINAL HYSTERECTOMY       Social History:   reports that she has never smoked. She has never used smokeless tobacco. She reports current alcohol use. She reports that she does not use drugs.   Family History:  Her family history includes Diabetes in her father and mother; Heart disease in her mother.   Allergies Allergies  Allergen Reactions   Ibuprofen Other (See Comments)    Pt is unable to take this due to kidney problems.      Penicillins Rash and Other (See Comments)    Has patient had a PCN reaction causing immediate rash, facial/tongue/throat swelling, SOB or lightheadedness with hypotension: No Has patient had a PCN reaction causing severe rash involving mucus membranes or skin necrosis: No Has patient had a PCN reaction that required hospitalization No Has patient had a PCN reaction occurring within the last 10 years: No If all of the above answers are "NO", then may proceed with Cephalosporin use.     Home Medications  Prior to Admission medications   Medication Sig Start Date End Date Taking?  Authorizing Provider  acetaminophen (TYLENOL) 325 MG tablet  Take 2 tablets (650 mg total) by mouth every 6 (six) hours as needed for mild pain (or Fever >/= 101). 07/12/16  Yes Vann, Jessica U, DO  albuterol (PROVENTIL HFA;VENTOLIN HFA) 108 (90 Base) MCG/ACT inhaler Inhale 2 puffs into the lungs every 6 (six) hours as needed for wheezing or shortness of breath.   Yes [provider]  amitriptyline (ELAVIL) 50 MG tablet Take 50 mg by mouth at bedtime. 11/04/21  Yes [provider]  Calcium Carb-Cholecalciferol (CALCIUM CARBONATE-VITAMIN D3 PO) Take 1 tablet by mouth daily.   Yes [provider]  DULoxetine (CYMBALTA) 60 MG capsule Take 60 mg by mouth daily.   Yes [provider]  folic acid (FOLVITE) 1 MG tablet Take 2 mg by mouth daily.   Yes [provider]  gabapentin (NEURONTIN) 300 MG capsule Take 600-1,200 mg by mouth See admin instructions. Take 3 (900 mg total) capsules by mouth in the morning, 2 (600 mg) capsule in the afternoon and 4 (1200 mg) capsules in the evening.   Yes [provider]  lidocaine (LIDODERM) 5 % Place 1 patch onto the skin daily as needed (for pain). Remove & Discard patch within 12 hours or as directed by MD   Yes [provider]  linaclotide (LINZESS) 145 MCG CAPS capsule Take 145 mcg by mouth daily before breakfast. 09/21/21  Yes [provider]  Melatonin 10 MG TABS Take 10 mg by mouth at bedtime as needed (sleep).   Yes [provider]  metFORMIN (GLUCOPHAGE-XR) 500 MG 24 hr tablet Take 500 mg by mouth daily with breakfast. 03/13/22  Yes [provider]  methocarbamol (ROBAXIN) 500 MG tablet Take 500 mg by mouth every 6 (six) hours as needed for muscle spasms. 02/02/22  Yes [provider]  METHOTREXATE SODIUM IJ Inject 25 mg into the skin every Sunday.   Yes [provider]  naloxone (NARCAN) nasal spray 4 mg/0.1 mL Place 0.4 mg into the nose once. 12/11/19  Yes  [provider]  nystatin (MYCOSTATIN) 100000 UNIT/ML suspension Use as directed 5 mLs in the mouth or throat 4 (four) times daily.   Yes [provider]  pantoprazole (PROTONIX) 40 MG tablet Take 40 mg by mouth daily. 07/01/21  Yes [provider]  Potassium Chloride Crys ER (K-DUR PO) Take 40 mEq by mouth every other day.   Yes [provider]  predniSONE (DELTASONE) 10 MG tablet Take 4 tabs (40mg ) daily for 2 days, then reduce to your home dose of 5mg  daily Patient taking differently: Take 5 mg by mouth daily with breakfast. 02/23/22  Yes Dessa Phi, DO  rosuvastatin (CRESTOR) 10 MG tablet Take 10 mg by mouth at bedtime. 10/25/21  Yes [provider]  sulfamethoxazole-trimethoprim (BACTRIM) 400-80 MG tablet Take 1 tablet by mouth 3 (three) times a week. Monday,Wednesday and Friday 07/13/21  Yes [provider]  tapentadol HCl (NUCYNTA) 75 MG tablet Take 75 mg by mouth. Take 1 tablet in the morning, 1 tablet in the afternoon and 2 tablets at bedtime   Yes [provider]  topiramate (TOPAMAX) 100 MG tablet Take 100 mg by mouth daily. 01/14/18  Yes [provider]  vitamin B-12 (CYANOCOBALAMIN) 1000 MCG tablet Take 1,000 mcg by mouth daily.   Yes [provider]  Vitamin D, Ergocalciferol, (DRISDOL) 50000 units CAPS capsule Take 50,000 Units by mouth every Friday.   Yes [provider]  oxyCODONE 10 MG TABS Take 1 tablet (10 mg total)  by mouth every 4 (four) hours as needed for severe pain or breakthrough pain. Patient not taking: Reported on 03/26/2022 02/23/22   Dessa Phi, DO  senna (SENOKOT) 8.6 MG TABS tablet Take 2 tablets (17.2 mg total) by mouth 2 (two) times daily. Patient not taking: Reported on 03/26/2022 03/25/21   Corky Sing, PA-C    Sherrilyn Rist, MD [place] [facility] Pager: See Shea Evans

## 2022-03-31 NOTE — Progress Notes (Signed)
ANTICOAGULATION CONSULT NOTE  Pharmacy Consult for transition from heparin infusion to apixaban Indication: pulmonary embolus  Allergies  Allergen Reactions   Ibuprofen Other (See Comments)    Pt is unable to take this due to kidney problems.      Penicillins Rash and Other (See Comments)    Has patient had a PCN reaction causing immediate rash, facial/tongue/throat swelling, SOB or lightheadedness with hypotension: No Has patient had a PCN reaction causing severe rash involving mucus membranes or skin necrosis: No Has patient had a PCN reaction that required hospitalization No Has patient had a PCN reaction occurring within the last 10 years: No If all of the above answers are "NO", then may proceed with Cephalosporin use.    Patient Measurements: Height: 5\' 4"  (162.6 cm) Weight: 91.4 kg (201 lb 8 oz) IBW/kg (Calculated) : 54.7 Heparin Dosing Weight: 76 kg  Vital Signs: Temp: 98.6 F (37 C) (06/09 0507) Temp Source: Oral (06/09 0507) BP: 138/70 (06/09 0507) Pulse Rate: 60 (06/09 0507)  Labs: Recent Labs    03/28/22 1317 03/28/22 1545 03/29/22 0622 03/30/22 0040 03/30/22 2026 03/31/22 0751  HGB  --    < > 8.2* 9.0*  --  9.7*  HCT  --    < > 28.1* 30.9*  --  33.4*  PLT  --    < > 246 287  --  310  HEPARINUNFRC  --    < >  --  0.17* 0.26* 0.55  CREATININE  --   --  0.81 0.93  --   --   TROPONINIHS 10  --   --   --   --   --    < > = values in this interval not displayed.     Estimated Creatinine Clearance: 74 mL/min (by C-G formula based on SCr of 0.93 mg/dL).   Medical History: Past Medical History:  Diagnosis Date   Chronic cough    Chronic pain    Diabetes mellitus without complication (HCC)    Dyspnea    GERD (gastroesophageal reflux disease)    Hypokalemia    Hypothyroidism    Left wrist pain 06/01/2021   Myonecrosis (HCC) 07/01/2021   Neurocardiogenic syncope    OSA (obstructive sleep apnea)    does not use CPAP   Osteomyelitis (HCC)    bilateral  feet   Peripheral neuropathy    Presence of intrathecal pump    recieves Prialt/bupivicaine   Tear of gluteus medius tendon 07/01/2021   Wegener's granulomatosis 2009   Wegner's disease (congenital syphilitic osteochondritis)     Medications:  Medications Prior to Admission  Medication Sig Dispense Refill Last Dose   acetaminophen (TYLENOL) 325 MG tablet Take 2 tablets (650 mg total) by mouth every 6 (six) hours as needed for mild pain (or Fever >/= 101).   unknown   albuterol (PROVENTIL HFA;VENTOLIN HFA) 108 (90 Base) MCG/ACT inhaler Inhale 2 puffs into the lungs every 6 (six) hours as needed for wheezing or shortness of breath.   unknown   amitriptyline (ELAVIL) 50 MG tablet Take 50 mg by mouth at bedtime.   03/25/2022   Calcium Carb-Cholecalciferol (CALCIUM CARBONATE-VITAMIN D3 PO) Take 1 tablet by mouth daily.   unknown   DULoxetine (CYMBALTA) 60 MG capsule Take 60 mg by mouth daily.   03/26/2022   folic acid (FOLVITE) 1 MG tablet Take 2 mg by mouth daily.   unknown   gabapentin (NEURONTIN) 300 MG capsule Take 600-1,200 mg by mouth See admin instructions.  Take 3 (900 mg total) capsules by mouth in the morning, 2 (600 mg) capsule in the afternoon and 4 (1200 mg) capsules in the evening.   03/26/2022   lidocaine (LIDODERM) 5 % Place 1 patch onto the skin daily as needed (for pain). Remove & Discard patch within 12 hours or as directed by MD   03/25/2022   linaclotide (LINZESS) 145 MCG CAPS capsule Take 145 mcg by mouth daily before breakfast.   03/26/2022   Melatonin 10 MG TABS Take 10 mg by mouth at bedtime as needed (sleep).   03/25/2022   metFORMIN (GLUCOPHAGE-XR) 500 MG 24 hr tablet Take 500 mg by mouth daily with breakfast.   03/26/2022   methocarbamol (ROBAXIN) 500 MG tablet Take 500 mg by mouth every 6 (six) hours as needed for muscle spasms.   03/25/2022   METHOTREXATE SODIUM IJ Inject 25 mg into the skin every Sunday.   03/19/2022   naloxone (NARCAN) nasal spray 4 mg/0.1 mL Place 0.4 mg into the  nose once.   unknown   nystatin (MYCOSTATIN) 100000 UNIT/ML suspension Use as directed 5 mLs in the mouth or throat 4 (four) times daily.   Past Week   pantoprazole (PROTONIX) 40 MG tablet Take 40 mg by mouth daily.   03/26/2022   Potassium Chloride Crys ER (K-DUR PO) Take 40 mEq by mouth every other day.   unknown   predniSONE (DELTASONE) 10 MG tablet Take 4 tabs (40mg ) daily for 2 days, then reduce to your home dose of 5mg  daily (Patient taking differently: Take 5 mg by mouth daily with breakfast.) 30 tablet 0 03/26/2022   rosuvastatin (CRESTOR) 10 MG tablet Take 10 mg by mouth at bedtime.   03/25/2022   sulfamethoxazole-trimethoprim (BACTRIM) 400-80 MG tablet Take 1 tablet by mouth 3 (three) times a week. Monday,Wednesday and Friday   03/24/2022   tapentadol HCl (NUCYNTA) 75 MG tablet Take 75 mg by mouth. Take 1 tablet in the morning, 1 tablet in the afternoon and 2 tablets at bedtime   03/26/2022   topiramate (TOPAMAX) 100 MG tablet Take 100 mg by mouth daily.   03/26/2022   vitamin B-12 (CYANOCOBALAMIN) 1000 MCG tablet Take 1,000 mcg by mouth daily.   unknown   Vitamin D, Ergocalciferol, (DRISDOL) 50000 units CAPS capsule Take 50,000 Units by mouth every Friday.   03/24/2022   oxyCODONE 10 MG TABS Take 1 tablet (10 mg total) by mouth every 4 (four) hours as needed for severe pain or breakthrough pain. (Patient not taking: Reported on 03/26/2022) 30 tablet 0 Not Taking   senna (SENOKOT) 8.6 MG TABS tablet Take 2 tablets (17.2 mg total) by mouth 2 (two) times daily. (Patient not taking: Reported on 03/26/2022) 30 tablet 0 Not Taking    Assessment: Pharmacy consulted to discontinue heparin infusion and begin apixaban therapy for DVT    Goal of Therapy:  Monitor platelets by anticoagulation protocol: Yes   Plan:  Stop heparin infusion Begin apixaban 10 mg po bid x7 days On day 8 (6/16) begin apixaban 5 mg po bid   Chevonne Bostrom BS, PharmD, BCPS Clinical Pharmacist 03/31/2022 8:28 AM  Contact:  [phone] after 3 PM  "Be curious, not judgmental..." -05/31/2022

## 2022-03-31 NOTE — Progress Notes (Signed)
Patient requested RN contact MD about dosage of Neurontin being increased to 1200 TID which was her home dose. RN noted patient was drowsy and dropping fork while eating. On call MD notified of request and patient status. No orders to increase dose due to metabolic encephalopathy. Patient has not complained of pain and has been sleeping throughout night. Brynda Rim, RN

## 2022-03-31 NOTE — Progress Notes (Signed)
[facility]    Date of Admission:  03/26/2022   Total days of antibiotics 6/ vanco & meropenem           ID: [name] is a 56 y.o. female with   Principal Problem:   Severe sepsis ([place]) Active Problems:   Presence of intrathecal pump   Chemotherapy-induced peripheral neuropathy (HCC)   Secondary adrenal insufficiency (HCC)   Stage 3a chronic kidney disease (HCC)   Lobar pneumonia (HCC)   Granulomatosis with polyangiitis (HCC)   Class 2 obesity   UTI (urinary tract infection)   Opioid dependence ([place])   Acute metabolic encephalopathy   Acute pulmonary embolus (HCC)    Subjective: Afebrile, still having some blood tinged sputum with her productive cough  Medications:   amitriptyline  50 mg Oral QHS   apixaban  10 mg Oral BID   Followed by   Derrill Memo ON 04/07/2022] apixaban  5 mg Oral BID   budesonide (PULMICORT) nebulizer solution  0.5 mg Nebulization BID   Chlorhexidine Gluconate Cloth  6 each Topical Daily   DULoxetine  30 mg Oral Daily   folic acid  2 mg Oral Daily   gabapentin  600 mg Oral TID   insulin aspart  0-9 Units Subcutaneous TID WC   ipratropium-albuterol  3 mL Nebulization BID   linaclotide  145 mcg Oral QAC breakfast   pantoprazole  40 mg Oral Daily   predniSONE  20 mg Oral Q breakfast   rosuvastatin  10 mg Oral QHS   sulfamethoxazole-trimethoprim  1 tablet Oral Once per day on Mon Wed Fri   vitamin B-12  1,000 mcg Oral Daily    Objective: Vital signs in last 24 hours: Temp:  [97.9 F (36.6 C)-98.9 F (37.2 C)] 98.1 F (36.7 C) (06/09 1627) Pulse Rate:  [60-94] 73 (06/09 1627) Resp:  [15-18] 15 (06/09 1627) BP: (94-138)/(70-80) 132/79 (06/09 1627) SpO2:  [90 %-99 %] 97 % (06/09 1627)  Physical Exam  Constitutional:  oriented to person, place, and time. appears well-developed and well-nourished. No distress.  HENT: [place]/AT, PERRLA, no scleral icterus Mouth/Throat: Oropharynx is clear and moist. No oropharyngeal  exudate.  Cardiovascular: Normal rate, regular rhythm and normal heart sounds. Exam reveals no gallop and no friction rub.  No murmur heard.  Pulmonary/Chest: Effort normal and breath sounds normal. No respiratory distress.  has no wheezes.  Neck = supple, no nuchal rigidity Abdominal: Soft. Bowel sounds are normal.  exhibits no distension. There is no tenderness.  Lymphadenopathy: no cervical adenopathy. No axillary adenopathy Neurological: alert and oriented to person, place, and time.  Skin: Skin is warm and dry. No rash noted. No erythema.  Psychiatric: a normal mood and affect.  behavior is normal.    Lab Results Recent Labs    03/30/22 0040 03/31/22 0751  WBC 9.7 6.3  HGB 9.0* 9.7*  HCT 30.9* 33.4*  NA 139 142  K 3.5 3.5  CL 112* 111  CO2 20* 22  BUN 8 10  CREATININE 0.93 0.76   Liver Panel Recent Labs    03/30/22 0040 03/31/22 0751  PROT 4.9* 5.3*  ALBUMIN 2.4* 2.6*  AST 18 18  ALT 19 19  ALKPHOS 80 73  BILITOT 0.4 0.3   Sedimentation Rate No results for input(s): "ESRSEDRATE" in the last 72 hours. C-Reactive Protein Recent Labs    03/30/22 0040 03/31/22 0751  CRP 3.3* 2.7*    Microbiology: pending Studies/Results: VAS [place] LOWER EXTREMITY VENOUS (  DVT)  Result Date: 03/30/2022  Lower Venous DVT Study Patient Name:  DURENE DODGE  Date of Exam:   03/30/2022 Medical Rec #: [id number]    Accession #:    [id number] Date of Birth: 12/23/1965    Patient Gender: F Patient Age:   11 years Exam Location:  [facility] Procedure:      VAS [place] LOWER EXTREMITY VENOUS (DVT) Referring Phys: Deno Etienne [facility] --------------------------------------------------------------------------------  Indications: Swelling, pulmonary embolism, and Elevated D-dimer.  Performing Technologist: Bobetta Lime  Examination Guidelines: A complete evaluation includes B-mode imaging, spectral Doppler, color Doppler, and power Doppler as needed of all accessible portions of each vessel. Bilateral  testing is considered an integral part of a complete examination. Limited examinations for reoccurring indications may be performed as noted. The reflux portion of the exam is performed with the patient in reverse Trendelenburg.  +---------+---------------+---------+-----------+----------+--------------+ RIGHT    CompressibilityPhasicitySpontaneityPropertiesThrombus Aging +---------+---------------+---------+-----------+----------+--------------+ CFV      Full           Yes      Yes                                 +---------+---------------+---------+-----------+----------+--------------+ SFJ      Full                                                        +---------+---------------+---------+-----------+----------+--------------+ FV Prox  Full                                                        +---------+---------------+---------+-----------+----------+--------------+ FV Mid   Full                                                        +---------+---------------+---------+-----------+----------+--------------+ FV DistalFull                                                        +---------+---------------+---------+-----------+----------+--------------+ PFV      Full                                                        +---------+---------------+---------+-----------+----------+--------------+ POP      Full           Yes      Yes                                 +---------+---------------+---------+-----------+----------+--------------+ PTV      Full                                                        +---------+---------------+---------+-----------+----------+--------------+  PERO     Full                                                        +---------+---------------+---------+-----------+----------+--------------+   +---------+---------------+---------+-----------+----------+--------------+ LEFT      CompressibilityPhasicitySpontaneityPropertiesThrombus Aging +---------+---------------+---------+-----------+----------+--------------+ CFV      Full           Yes      Yes                                 +---------+---------------+---------+-----------+----------+--------------+ SFJ      Full                                                        +---------+---------------+---------+-----------+----------+--------------+ FV Prox  Full                                                        +---------+---------------+---------+-----------+----------+--------------+ FV Mid   Full                                                        +---------+---------------+---------+-----------+----------+--------------+ FV DistalFull                                                        +---------+---------------+---------+-----------+----------+--------------+ PFV      Full                                                        +---------+---------------+---------+-----------+----------+--------------+ POP      Full           Yes      Yes                                 +---------+---------------+---------+-----------+----------+--------------+ PTV      Full                                                        +---------+---------------+---------+-----------+----------+--------------+ PERO     Full                                                        +---------+---------------+---------+-----------+----------+--------------+       Summary: RIGHT: - There is no evidence of deep vein thrombosis in the lower extremity.  - No cystic structure found in the popliteal fossa.  LEFT: - There is no evidence of deep vein thrombosis in the lower extremity.  - No cystic structure found in the popliteal fossa.  *See table(s) above for measurements and observations. Electronically signed by Monica Martinez MD on 03/30/2022 at 4:47:59 PM.    Final    DG CHEST PORT 1  VIEW  Result Date: 03/30/2022 CLINICAL DATA:  Status post right lung biopsy EXAM: PORTABLE CHEST 1 VIEW COMPARISON:  03/29/2022 FINDINGS: Unchanged AP portable chest radiograph with subtle right greater than left upper lobe heterogeneous airspace opacities and probable small, layering bilateral pleural effusions. No new airspace opacity. No pneumothorax. Mild cardiomegaly. The visualized skeletal structures are unremarkable. IMPRESSION: Unchanged AP portable chest radiograph with subtle right greater than left upper lobe heterogeneous airspace opacities and probable small, layering bilateral pleural effusions. No new airspace opacity. No pneumothorax following biopsy. Electronically Signed   By: Delanna Ahmadi M.D.   On: 03/30/2022 13:24   DG ESOPHAGUS W SINGLE CM (SOL OR THIN BA)  Result Date: 03/30/2022 CLINICAL DATA:  Patient complaining of frequent globus sensation, regurgitation, and history of esophageal stricture. EXAM: ESOPHAGUS/BARIUM SWALLOW/TABLET STUDY TECHNIQUE: Single contrast examination was performed using thin liquid barium. This exam was performed by Gareth Eagle, PA-C, and was supervised and interpreted by Dr. Fabiola Backer. FLUOROSCOPY: Radiation Exposure Index (as provided by the fluoroscopic device): 25.8 mGy Kerma COMPARISON:  CT chest dated March 27, 2022. FINDINGS: Study was limited secondary to patient's mobility issues and inability to stand. Swallowing: No aspiration seen. Esophagus: Smooth focal fixed narrowing of the distal esophagus just superior to the GE junction. No mucosal irregularity or defect. Esophageal motility: Mild dysmotility manifested by intermittent tertiary contractions. Hiatal Hernia: None. Gastroesophageal reflux: None visualized. Ingested 21m barium tablet: Became stuck and did not pass beyond the gastroesophageal junction despite giving water and barium. Other: None. IMPRESSION: 1. Short-segment stricture of the distal esophagus just superior to the GE junction  which did not allow passage of a barium tablet. This is likely reflux related, although no reflux was elicited on today's study. 2. Mild esophageal dysmotility. Electronically Signed   By: WTitus DubinM.D.   On: 03/30/2022 09:27     Assessment/Plan: Recurrent pneumonia = continue on vancomycin plus meropenem. Will follow up on BAL cultures. Will treat for extended course with 14 days of iv abtx. Will place picc line orders.  Wegener's disease = on higher dose steroids plus oi proph with bactrim. Will need to follow up on tissue biopsy path  High risk medication/therapeutic drug monitoring = continue to monitor cr and vanco levels  [facility] Pager: [phone]  03/31/2022, 5:31 PM

## 2022-03-31 NOTE — Progress Notes (Signed)
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  Diagnosed with sepsis due to pneumonia and transferred to [facility] for further care.   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  Monitor final cultures, speech therapy following, there could also be element of chronic microaspiration due to oversedation from her home medications, speech therapy on board.  Patient counseled along with husband on avoiding over sedation.  Acute pulmonary embolus (HCC) - 6/5 CTA chest--Small pulmonary emboli in RIGHT lung, stable echocardiogram with EF 55% and no RV strain.  Now placed on oral Eliquis unremarkable lower extremity venous duplex.  Granulomatosis with polyangiitis [facility]) - Patient follows with rheumatology at [facility], Dr. Allean Found, She is on methotrexate every Sunday, Rituximab currently on hold secondary to recent sepsis. She is chronically on prednisone 5 mg daily currently on 20 mg daily.  Monitor closely .  Chronic iron deficiency anemia.  Check anemia panel, PPI, outpatient age-appropriate work-up by PCP.  Monitor. Agreeable for transfusion if needed.  Toxic and metabolic encephalopathy.  Due to combination of #1 above and high-dose  narcotics and multiple sedatives being on board.  She has been counseled not to overuse narcotics as risk of respiratory failure and intubation is high, minimize offending medications, as needed Narcan, head CT is negative and she has no focal deficits.  Mentation much improved on 03/30/2022 with adjusted medications.  Opioid dependence ([place])  - Patient has intrathecal pump with bupivacaine and ziconotide, Last refill 03/02/2022, Patient receives Nucynta 75 mg, #120; last refill 03/17/2022,  Patient is also on oxycodone 10 mg; #30, last refilled 02/23/2022.   Chemotherapy-induced peripheral neuropathy ([place]) - Patient is on gabapentin, Cymbalta, amitriptyline topiramate, trazodone, medications adjusted to reduce encephalopathy and sedation.  Presence of intrathecal pump - Patient has intrathecal pump with bupivacaine and ziconotide, Last refill 03/02/2022.  Stage 3a chronic kidney disease (HCC) Baseline creatinine 0.8-1.1, Monitor with serial BMP.  UTI (urinary tract infection) - UA > 50 WBC, culture  E.coli, continue merrem on 03/29/2022 then per ID.  Class 2 obesity- BMI 35.04, with PCP for weight loss    Family Communication:     Husband Antony Haste [phone] on 03/29/2022 at 11:11 AM, 03/30/2022, 03/31/2022  Consultants:  Pulmonary, ID  Code Status:  FULL   DVT Prophylaxis:  IV Heparin    Procedures:  Bronchoscopy 03/30/2022.    Leg [place] - no DVT.   CTA - Small pulmonary emboli in RIGHT lung. Small BILATERAL pleural effusions. Patchy BILATERAL pulmonary infiltrates consistent with multifocal pneumonia greatest in RIGHT upper lobe.  TTE - 1. Left ventricular ejection fraction, by estimation, is 55%. The left ventricle has normal function. The left ventricle has no regional wall motion abnormalities. Left ventricular diastolic parameters were normal.  2. Right ventricular systolic function is normal. The right ventricular size  is normal. There is normal pulmonary artery systolic pressure.  3. The  pericardial effusion is anterior to the right ventricle.  4. The mitral valve is abnormal. Trivial mitral valve regurgitation. No evidence of mitral stenosis.  5. The aortic valve is tricuspid. There is mild calcification of the aortic valve. Aortic valve regurgitation is not visualized. Aortic valve sclerosis is present, with no evidence of aortic valve stenosis.  6. The inferior vena cava is normal in size with greater than 50% respiratory variability, suggesting right atrial pressure of 3 mmHg.   Subjective:  Patient in bed, appears comfortable, denies any headache, no fever, no chest pain or pressure, no shortness of breath , no abdominal pain. No focal weakness.  Objective: Vitals:   03/31/22 0016 03/31/22 0507 03/31/22 0823 03/31/22 0919  BP: 130/79 138/70  106/80  Pulse: 65 60  94  Resp: 18 18  17   Temp: 98.5 F (36.9 C) 98.6 F (37 C)  97.9 F (36.6 C)  TempSrc: Oral Oral  Oral  SpO2: 90% 98% 99% 97%  Weight:      Height:        Intake/Output Summary (Last 24 hours) at 03/31/2022 1142 Last data filed at 03/31/2022 0000 Gross per 24 hour  Intake 344.89 ml  Output --  Net 344.89 ml   Weight change:   Exam:  Awake Alert, No new F.N deficits, Normal affect [place].AT,PERRAL Supple Neck, No JVD,   Symmetrical Chest wall movement, Good air movement bilaterally, CTAB RRR,No Gallops, Rubs or new Murmurs,  +ve B.Sounds, Abd Soft, No tenderness,   Bilateral transmetatarsal amputations   Data Reviewed:  Recent Labs  Lab 03/26/22 1007 03/27/22 0535 03/28/22 0402 03/29/22 0142 03/29/22 0622 03/30/22 0040 03/31/22 0751  WBC 13.3*   < > 13.8* 11.0* 8.9 9.7 6.3  HGB 9.8*   < > 7.8* 9.2* 8.2* 9.0* 9.7*  HCT 33.4*   < > 26.6* 31.1* 28.1* 30.9* 33.4*  PLT 247   < > 228 272 246 287 310  MCV 87.2   < > 87.5 86.1 86.2 87.5 86.8  MCH 25.6*   < > 25.7* 25.5* 25.2* 25.5* 25.2*  MCHC 29.3*   < > 29.3* 29.6* 29.2* 29.1* 29.0*  RDW 22.2*   < > 22.4* 22.5* 22.5* 22.5* 22.6*  LYMPHSABS  2.6  --   --   --   --  1.4 1.6  MONOABS 0.8  --   --   --   --  0.5 0.4  EOSABS 0.1  --   --   --   --  0.0 0.0  BASOSABS 0.0  --   --   --   --  0.0 0.1   < > = values in this interval not displayed.    Recent Labs  Lab 03/26/22 1005 03/26/22 1007 03/26/22 1007 03/26/22 1027 03/26/22 1156 03/27/22 0535 03/28/22 0402 03/29/22 0622 03/30/22 0040 03/31/22 0751  NA  --  141   < >  --   --  143 141 140 139 142  K  --  3.6   < >  --   --  3.8 3.1* 3.6 3.5 3.5  CL  --  114*   < >  --   --  122* 121* 110 112* 111  CO2  --  22   < >  --   --  20* 18* 21* 20* 22  GLUCOSE  --  140*   < >  --   --  118* 148* 124* 166* 145*  BUN  --  12   < >  --   --  14 11 7 8 10   CREATININE  --  1.11*   < >  --   --  0.86 0.69 0.81 0.93 0.76  CALCIUM  --  7.9*   < >  --   --  7.2* 7.3* 8.0* 8.2* 8.8*  AST  --  27  --   --   --  23  --  19 18 18   ALT  --  18  --   --   --  17  --  17 19 19   ALKPHOS  --  87  --   --   --  67  --  69 80 73  BILITOT  --  0.4  --   --   --  0.6  --  0.5 0.4 0.3  ALBUMIN  --  2.6*  --   --   --  2.4*  --  2.4* 2.4* 2.6*  MG  --   --   --   --   --   --  2.0 1.9 2.0 2.4  CRP  --   --   --   --   --   --   --  5.1* 3.3* 2.7*  PROCALCITON  --   --   --  0.32  --  0.74 0.50  --   --   --   LATICACIDVEN  --  2.0*  --   --  1.3  --   --   --   --   --   INR  --  1.1  --   --   --   --   --   --   --   --   HGBA1C  --   --   --  8.6*  --   --   --   --   --   --   BNP 243.0*  --   --   --   --   --   --   --  112.8* 41.9   < > = values in this interval not displayed.    Scheduled Meds:  amitriptyline  50 mg Oral QHS   apixaban  10 mg Oral BID   Followed by   Derrill Memo ON 04/07/2022] apixaban  5 mg Oral BID   budesonide (PULMICORT) nebulizer solution  0.5 mg Nebulization BID   Chlorhexidine Gluconate Cloth  6 each Topical Daily   DULoxetine  30 mg Oral Daily   folic acid  2 mg Oral Daily   gabapentin  600 mg Oral TID   insulin aspart  0-9 Units Subcutaneous TID WC    ipratropium-albuterol  3 mL Nebulization BID   linaclotide  145 mcg Oral QAC breakfast   pantoprazole  40 mg Oral Daily   potassium chloride  40 mEq Oral Once   predniSONE  20 mg Oral Q breakfast   rosuvastatin  10 mg Oral QHS   sulfamethoxazole-trimethoprim  1 tablet Oral Once per day on Mon Wed Fri   vitamin B-12  1,000 mcg Oral Daily   Continuous Infusions:  meropenem (MERREM) IV 1 g (03/31/22 0519)   vancomycin 1,250 mg (03/31/22 0000)    Procedures/Studies: VAS [place] LOWER EXTREMITY VENOUS (DVT)  Result Date: 03/30/2022  Lower Venous DVT Study Patient Name:  [name]  Date of Exam:   03/30/2022 Medical Rec #: [id number]  Accession #:    [id number] Date of Birth: June 23, 1966    Patient Gender: F Patient Age:   49 years Exam Location:  [facility] Procedure:      VAS [place] LOWER EXTREMITY VENOUS (DVT) Referring Phys: Deno Etienne [facility] --------------------------------------------------------------------------------  Indications: Swelling, pulmonary embolism, and Elevated D-dimer.  Performing Technologist: Bobetta Lime  Examination Guidelines: A complete evaluation includes B-mode imaging, spectral Doppler, color Doppler, and power Doppler as needed of all accessible portions of each vessel. Bilateral testing is considered an integral part of a complete examination. Limited examinations for reoccurring indications may be performed as noted. The reflux portion of the exam is performed with the patient in reverse Trendelenburg.  +---------+---------------+---------+-----------+----------+--------------+ RIGHT    CompressibilityPhasicitySpontaneityPropertiesThrombus Aging +---------+---------------+---------+-----------+----------+--------------+ CFV      Full           Yes      Yes                                 +---------+---------------+---------+-----------+----------+--------------+ SFJ      Full                                                         +---------+---------------+---------+-----------+----------+--------------+ FV Prox  Full                                                        +---------+---------------+---------+-----------+----------+--------------+ FV Mid   Full                                                        +---------+---------------+---------+-----------+----------+--------------+ FV DistalFull                                                        +---------+---------------+---------+-----------+----------+--------------+ PFV      Full                                                        +---------+---------------+---------+-----------+----------+--------------+ POP      Full           Yes      Yes                                 +---------+---------------+---------+-----------+----------+--------------+ PTV      Full                                                        +---------+---------------+---------+-----------+----------+--------------+  PERO     Full                                                        +---------+---------------+---------+-----------+----------+--------------+   +---------+---------------+---------+-----------+----------+--------------+ LEFT     CompressibilityPhasicitySpontaneityPropertiesThrombus Aging +---------+---------------+---------+-----------+----------+--------------+ CFV      Full           Yes      Yes                                 +---------+---------------+---------+-----------+----------+--------------+ SFJ      Full                                                        +---------+---------------+---------+-----------+----------+--------------+ FV Prox  Full                                                        +---------+---------------+---------+-----------+----------+--------------+ FV Mid   Full                                                         +---------+---------------+---------+-----------+----------+--------------+ FV DistalFull                                                        +---------+---------------+---------+-----------+----------+--------------+ PFV      Full                                                        +---------+---------------+---------+-----------+----------+--------------+ POP      Full           Yes      Yes                                 +---------+---------------+---------+-----------+----------+--------------+ PTV      Full                                                        +---------+---------------+---------+-----------+----------+--------------+ PERO     Full                                                        +---------+---------------+---------+-----------+----------+--------------+  Summary: RIGHT: - There is no evidence of deep vein thrombosis in the lower extremity.  - No cystic structure found in the popliteal fossa.  LEFT: - There is no evidence of deep vein thrombosis in the lower extremity.  - No cystic structure found in the popliteal fossa.  *See table(s) above for measurements and observations. Electronically signed by Monica Martinez MD on 03/30/2022 at 4:47:59 PM.    Final    DG CHEST PORT 1 VIEW  Result Date: 03/30/2022 CLINICAL DATA:  Status post right lung biopsy EXAM: PORTABLE CHEST 1 VIEW COMPARISON:  03/29/2022 FINDINGS: Unchanged AP portable chest radiograph with subtle right greater than left upper lobe heterogeneous airspace opacities and probable small, layering bilateral pleural effusions. No new airspace opacity. No pneumothorax. Mild cardiomegaly. The visualized skeletal structures are unremarkable. IMPRESSION: Unchanged AP portable chest radiograph with subtle right greater than left upper lobe heterogeneous airspace opacities and probable small, layering bilateral pleural effusions. No new airspace opacity. No pneumothorax following biopsy.  Electronically Signed   By: Delanna Ahmadi M.D.   On: 03/30/2022 13:24   DG ESOPHAGUS W SINGLE CM (SOL OR THIN BA)  Result Date: 03/30/2022 CLINICAL DATA:  Patient complaining of frequent globus sensation, regurgitation, and history of esophageal stricture. EXAM: ESOPHAGUS/BARIUM SWALLOW/TABLET STUDY TECHNIQUE: Single contrast examination was performed using thin liquid barium. This exam was performed by Gareth Eagle, PA-C, and was supervised and interpreted by Dr. Fabiola Backer. FLUOROSCOPY: Radiation Exposure Index (as provided by the fluoroscopic device): 25.8 mGy Kerma COMPARISON:  CT chest dated March 27, 2022. FINDINGS: Study was limited secondary to patient's mobility issues and inability to stand. Swallowing: No aspiration seen. Esophagus: Smooth focal fixed narrowing of the distal esophagus just superior to the GE junction. No mucosal irregularity or defect. Esophageal motility: Mild dysmotility manifested by intermittent tertiary contractions. Hiatal Hernia: None. Gastroesophageal reflux: None visualized. Ingested 18mm barium tablet: Became stuck and did not pass beyond the gastroesophageal junction despite giving water and barium. Other: None. IMPRESSION: 1. Short-segment stricture of the distal esophagus just superior to the GE junction which did not allow passage of a barium tablet. This is likely reflux related, although no reflux was elicited on today's study. 2. Mild esophageal dysmotility. Electronically Signed   By: Titus Dubin M.D.   On: 03/30/2022 09:27   DG Chest Port 1 View  Result Date: 03/29/2022 CLINICAL DATA:  Shortness of breath and cough. EXAM: PORTABLE CHEST 1 VIEW COMPARISON:  03/26/2022 FINDINGS: Stable cardiomediastinal contours. Lungs are suboptimally inflated. No signs of pleural effusion or edema. Persistent asymmetric airspace opacification within the right upper lobe. IMPRESSION: Persistent asymmetric airspace opacification within the right upper lobe compatible with  pneumonia. Electronically Signed   By: Kerby Moors M.D.   On: 03/29/2022 06:43   ECHOCARDIOGRAM COMPLETE  Result Date: 03/28/2022    ECHOCARDIOGRAM REPORT   Patient Name:   Ndeye Rocks Date of Exam: 03/28/2022 Medical Rec #:  [id number]   Height:       64.0 in Accession #:    [id number]  Weight:       211.4 lb Date of Birth:  01-25-1966   BSA:          2.003 m Patient Age:    54 years    BP:           121/84 mmHg Patient Gender: F           HR:           86 bpm.  Exam Location:  [facility] Procedure: 2D Echo, Cardiac Doppler and Color Doppler Indications:    Pulmonary embolus  History:        Patient has no prior history of Echocardiogram examinations.                 Risk Factors:Hypertension and Dyslipidemia.  Sonographer:    Wenda Low Referring Phys: [id number] DAVID TAT IMPRESSIONS  1. Left ventricular ejection fraction, by estimation, is 55%. The left ventricle has normal function. The left ventricle has no regional wall motion abnormalities. Left ventricular diastolic parameters were normal.  2. Right ventricular systolic function is normal. The right ventricular size is normal. There is normal pulmonary artery systolic pressure.  3. The pericardial effusion is anterior to the right ventricle.  4. The mitral valve is abnormal. Trivial mitral valve regurgitation. No evidence of mitral stenosis.  5. The aortic valve is tricuspid. There is mild calcification of the aortic valve. Aortic valve regurgitation is not visualized. Aortic valve sclerosis is present, with no evidence of aortic valve stenosis.  6. The inferior vena cava is normal in size with greater than 50% respiratory variability, suggesting right atrial pressure of 3 mmHg. FINDINGS  Left Ventricle: Left ventricular ejection fraction, by estimation, is 55%. The left ventricle has normal function. The left ventricle has no regional wall motion abnormalities. The left ventricular internal cavity size was normal in size. There is no left ventricular  hypertrophy. Left ventricular diastolic parameters were normal. Right Ventricle: The right ventricular size is normal. No increase in right ventricular wall thickness. Right ventricular systolic function is normal. There is normal pulmonary artery systolic pressure. The tricuspid regurgitant velocity is 1.76 m/s, and  with an assumed right atrial pressure of 3 mmHg, the estimated right ventricular systolic pressure is 123XX123 mmHg. Left Atrium: Left atrial size was normal in size. Right Atrium: Right atrial size was normal in size. Pericardium: Trivial pericardial effusion is present. The pericardial effusion is anterior to the right ventricle. Mitral Valve: The mitral valve is abnormal. There is mild thickening of the mitral valve leaflet(s). There is mild calcification of the mitral valve leaflet(s). Trivial mitral valve regurgitation. No evidence of mitral valve stenosis. MV peak gradient, 6.5 mmHg. The mean mitral valve gradient is 3.0 mmHg. Tricuspid Valve: The tricuspid valve is normal in structure. Tricuspid valve regurgitation is trivial. No evidence of tricuspid stenosis. Aortic Valve: The aortic valve is tricuspid. There is mild calcification of the aortic valve. Aortic valve regurgitation is not visualized. Aortic valve sclerosis is present, with no evidence of aortic valve stenosis. Aortic valve mean gradient measures 5.0 mmHg. Aortic valve peak gradient measures 10.8 mmHg. Aortic valve area, by VTI measures 2.22 cm. Pulmonic Valve: The pulmonic valve was normal in structure. Pulmonic valve regurgitation is not visualized. No evidence of pulmonic stenosis. Aorta: The aortic root is normal in size and structure. Venous: The inferior vena cava is normal in size with greater than 50% respiratory variability, suggesting right atrial pressure of 3 mmHg. IAS/Shunts: No atrial level shunt detected by color flow Doppler.  LEFT VENTRICLE PLAX 2D LVIDd:         4.90 cm     Diastology LVIDs:         3.50 cm     LV e'  medial:    7.72 cm/s LV PW:         1.00 cm     LV E/e' medial:  15.2 LV IVS:  0.90 cm     LV e' lateral:   14.60 cm/s LVOT diam:     2.00 cm     LV E/e' lateral: 8.0 LV SV:         76 LV SV Index:   38 LVOT Area:     3.14 cm  LV Volumes (MOD) LV vol d, MOD A2C: 60.2 ml LV vol d, MOD A4C: 49.4 ml LV vol s, MOD A2C: 24.4 ml LV vol s, MOD A4C: 23.8 ml LV SV MOD A2C:     35.8 ml LV SV MOD A4C:     49.4 ml LV SV MOD BP:      30.8 ml RIGHT VENTRICLE RV Basal diam:  3.15 cm RV Mid diam:    2.80 cm RV S prime:     13.40 cm/s TAPSE (M-mode): 2.3 cm LEFT ATRIUM             Index        RIGHT ATRIUM           Index LA diam:        3.70 cm 1.85 cm/m   RA Area:     15.50 cm LA Vol (A2C):   46.0 ml 22.96 ml/m  RA Volume:   37.70 ml  18.82 ml/m LA Vol (A4C):   50.9 ml 25.41 ml/m LA Biplane Vol: 50.3 ml 25.11 ml/m  AORTIC VALVE                     PULMONIC VALVE AV Area (Vmax):    2.38 cm      PV Vmax:       0.90 m/s AV Area (Vmean):   2.30 cm      PV Peak grad:  3.2 mmHg AV Area (VTI):     2.22 cm AV Vmax:           164.00 cm/s AV Vmean:          107.000 cm/s AV VTI:            0.344 m AV Peak Grad:      10.8 mmHg AV Mean Grad:      5.0 mmHg LVOT Vmax:         124.00 cm/s LVOT Vmean:        78.500 cm/s LVOT VTI:          0.243 m LVOT/AV VTI ratio: 0.71  AORTA Ao Root diam: 2.90 cm MITRAL VALVE                TRICUSPID VALVE MV Area (PHT): 3.68 cm     TR Peak grad:   12.4 mmHg MV Area VTI:   2.13 cm     TR Vmax:        176.00 cm/s MV Peak grad:  6.5 mmHg MV Mean grad:  3.0 mmHg     SHUNTS MV Vmax:       1.27 m/s     Systemic VTI:  0.24 m MV Vmean:      77.6 cm/s    Systemic Diam: 2.00 cm MV Decel Time: 206 msec MV E velocity: 117.00 cm/s MV A velocity: 89.50 cm/s MV E/A ratio:  1.31 Jenkins Rouge MD Electronically signed by Jenkins Rouge MD Signature Date/Time: 03/28/2022/12:20:15 PM    Final    CT Angio Chest Pulmonary Embolism (PE) W or WO Contrast  Result Date: 03/27/2022 CLINICAL DATA:  Chest pain and  shortness of breath for several weeks, recent  pneumonia EXAM: CT ANGIOGRAPHY CHEST WITH CONTRAST TECHNIQUE: Multidetector CT imaging of the chest was performed using the standard protocol during bolus administration of intravenous contrast. Multiplanar CT image reconstructions and MIPs were obtained to evaluate the vascular anatomy. RADIATION DOSE REDUCTION: This exam was performed according to the departmental dose-optimization program which includes automated exposure control, adjustment of the mA and/or kV according to patient size and/or use of iterative reconstruction technique. CONTRAST:  83mL OMNIPAQUE IOHEXOL 300 MG/ML  SOLN IV COMPARISON:  CT chest 01/04/2022 FINDINGS: Cardiovascular: Aorta normal caliber without aneurysm or dissection. Proximal great vessels unremarkable. Heart normal appearance. No pericardial effusion. Pulmonary arteries adequately opacified. Two small filling defects are identified consistent with pulmonary embolism, in RIGHT lower lobe image 214 and in RIGHT middle lobe image 160. No additional emboli. Mediastinum/Nodes: Esophagus unremarkable. Few normal sized mediastinal lymph nodes. No thoracic adenopathy. Base of cervical region normal appearance. Lungs/Pleura: Small BILATERAL pleural effusions. BILATERAL pulmonary infiltrates, greatest in RIGHT upper lobe. No pneumothorax or mass. Upper Abdomen: Gallbladder surgically absent. Splenic deformity question related to prior infarct or surgery. Musculoskeletal: No acute osseous findings. Review of the MIP images confirms the above findings. IMPRESSION: Small pulmonary emboli in RIGHT lung. Small BILATERAL pleural effusions. Patchy BILATERAL pulmonary infiltrates consistent with multifocal pneumonia greatest in RIGHT upper lobe. Critical Value/emergent results were called by telephone at the time of interpretation on 03/27/2022 at 1510 hrs to provider DAVID TAT MD, who verbally acknowledged these results. Electronically Signed   By: Lavonia Dana M.D.   On: 03/27/2022 15:13   CT Head Wo Contrast  Result Date: 03/26/2022 CLINICAL DATA:  Altered mental status.  Recent stroke. EXAM: CT HEAD WITHOUT CONTRAST TECHNIQUE: Contiguous axial images were obtained from the base of the skull through the vertex without intravenous contrast. RADIATION DOSE REDUCTION: This exam was performed according to the departmental dose-optimization program which includes automated exposure control, adjustment of the mA and/or kV according to patient size and/or use of iterative reconstruction technique. COMPARISON:  11/01/2020 FINDINGS: Brain: No evidence of intracranial hemorrhage, acute infarction, hydrocephalus, extra-axial collection, or mass lesion/mass effect. 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 Vascular:  No hyperdense vessel or other acute findings. Skull: No evidence of fracture or other significant bone abnormality. Sinuses/Orbits:  No acute findings. Other: None. IMPRESSION: No acute intracranial abnormality. Electronically Signed   By: Marlaine Hind M.D.   On: 03/26/2022 11:42   DG Chest Port 1 View  Result Date: 03/26/2022 CLINICAL DATA:  Sepsis. EXAM: PORTABLE CHEST 1 VIEW COMPARISON:  February 19, 2022 FINDINGS: There is infiltrate in the right mid lung. There may be infiltrate in the medial left upper lung. No pneumothorax. No other acute interval changes. The study is limited however due to portable technique. IMPRESSION: The study is limited due to low volume portable technique. However, there is an infiltrate in the right mid lung consistent with for pneumonia given history. There may be a more subtle smaller infiltrate in the medial left upper lung. Electronically Signed   By: Dorise Bullion III M.D.   On: 03/26/2022 10:22     LOS: 5 days   Signature  Lala Lund M.D on 03/31/2022 at 11:42 AM   -  To page go to [email or web address]

## 2022-03-31 NOTE — Progress Notes (Addendum)
Physical Therapy Treatment & Discharge Patient Details Name: [name] MRN: [id number] DOB: [birth year] Today's Date: 03/31/2022   History of Present Illness Pt is a 56 yo female admitted 03/26/22 with AMS; workup for severe sepsis, UTI, PNA. Chest CTA 6/5 with acute PE. S/p bronchoscopy 6/8. PMH includes Wegener's disease, CKD 3, DM, HTN, chronic pain with an intrathecal pump, OSA, bilateral mid-foot amputations, peripheral neuropathy.   PT Comments    Pt progressing with mobility. Pt demonstrates improving activity tolerance and stability; independent with transfers and ambulation. Reviewed educ re activity recommendations; encouraged more frequent hallway ambulation and incentive spirometer use. Pt has met short-term acute PT goals, reports no further questions or concerns. Will d/c acute PT.  SpO2 97% on RA, HR 107 with ambulation    Recommendations for follow up therapy are one component of a multi-disciplinary discharge planning process, led by the attending physician.  Recommendations may be updated based on patient status, additional functional criteria and insurance authorization.  Follow Up Recommendations  No PT follow up     Assistance Recommended at Discharge PRN  Patient can return home with the following Assistance with cooking/housework;Assist for transportation   Equipment Recommendations  None recommended by PT    Recommendations for Other Services       Precautions / Restrictions Precautions Precautions: Fall;Other (comment) Precaution Comments: h/o bilateral transmet amputations Restrictions Weight Bearing Restrictions: No     Mobility  Bed Mobility Overal bed mobility: Independent                  Transfers Overall transfer level: Independent Equipment used: None Transfers: Sit to/from Stand                  Ambulation/Gait Ambulation/Gait assistance: Independent Social research officer, government (Feet): 280 Feet Assistive device: None, IV Pole Gait  Pattern/deviations: Step-through pattern, Decreased stride length Gait velocity: Decreased     General Gait Details: slwo, steady gait indep with and without pushing IV pole; demonstrates good line management; no DOE noted, pt demonstrates good activity pacing   Stairs             Wheelchair Mobility    Modified Rankin (Stroke Patients Only)       Balance Overall balance assessment: No apparent balance deficits (not formally assessed) Sitting-balance support: No upper extremity supported Sitting balance-Leahy Scale: Good     Standing balance support: During functional activity, No upper extremity supported Standing balance-Leahy Scale: Good                              Cognition Arousal/Alertness: Awake/alert Behavior During Therapy: WFL for tasks assessed/performed Overall Cognitive Status: Within Functional Limits for tasks assessed                                          Exercises Other Exercises Other Exercises: incentive spirometer x5 - pt pulling ~1750 mL    General Comments General comments (skin integrity, edema, etc.): SpO2 97% on RA, HR 107. pt's father present      Pertinent Vitals/Pain Pain Assessment Pain Assessment: Faces Faces Pain Scale: Hurts a little bit Pain Location: RLE Pain Descriptors / Indicators: Sore Pain Intervention(s): Monitored during session    Home Living  Prior Function            PT Goals (current goals can now be found in the care plan section) Progress towards PT goals: Goals met/education completed, patient discharged from PT    Frequency    Min 3X/week      PT Plan Current plan remains appropriate    Co-evaluation              AM-PAC PT "6 Clicks" Mobility   Outcome Measure  Help needed turning from your back to your side while in a flat bed without using bedrails?: None Help needed moving from lying on your back to sitting on the  side of a flat bed without using bedrails?: None Help needed moving to and from a bed to a chair (including a wheelchair)?: None Help needed standing up from a chair using your arms (e.g., wheelchair or bedside chair)?: None Help needed to walk in hospital room?: None Help needed climbing 3-5 steps with a railing? : A Little 6 Click Score: 23    End of Session   Activity Tolerance: Patient tolerated treatment well Patient left: in bed;with call bell/phone within reach;with family/visitor present Nurse Communication: Mobility status PT Visit Diagnosis: Unsteadiness on feet (R26.81);Other abnormalities of gait and mobility (R26.89);Muscle weakness (generalized) (M62.81)     Time: 5397-6734 PT Time Calculation (min) (ACUTE ONLY): 13 min  Charges:  $Therapeutic Exercise: 8-22 mins                     Mabeline Caras, PT, DPT Acute Rehabilitation Services  Pager [phone] Office [phone]  Derry Lory 03/31/2022, 2:39 PM

## 2022-03-31 NOTE — Discharge Instructions (Addendum)
Information on my medicine - ELIQUIS (apixaban)  This medication education was reviewed with me or my healthcare representative as part of my discharge preparation.  Why was Eliquis prescribed for you? Eliquis was prescribed to treat blood clots that may have been found in the veins of your legs (deep vein thrombosis) or in your lungs (pulmonary embolism) and to reduce the risk of them occurring again.  What do You need to know about Eliquis ? The starting dose is 10 mg (two 5 mg tablets) taken TWICE daily for the FIRST SEVEN (7) DAYS, then on  04/07/2022  the dose is reduced to ONE 5 mg tablet taken TWICE daily.  Eliquis may be taken with or without food.   Try to take the dose about the same time in the morning and in the evening. If you have difficulty swallowing the tablet whole please discuss with your pharmacist how to take the medication safely.  Take Eliquis exactly as prescribed and DO NOT stop taking Eliquis without talking to the doctor who prescribed the medication.  Stopping may increase your risk of developing a new blood clot.  Refill your prescription before you run out.  After discharge, you should have regular check-up appointments with your healthcare provider that is prescribing your Eliquis.    What do you do if you miss a dose? If a dose of ELIQUIS is not taken at the scheduled time, take it as soon as possible on the same day and twice-daily administration should be resumed. The dose should not be doubled to make up for a missed dose.  Important Safety Information A possible side effect of Eliquis is bleeding. You should call your healthcare provider right away if you experience any of the following: Bleeding from an injury or your nose that does not stop. Unusual colored urine (red or dark brown) or unusual colored stools (red or black). Unusual bruising for unknown reasons. A serious fall or if you hit your head (even if there is no bleeding).  Some  medicines may interact with Eliquis and might increase your risk of bleeding or clotting while on Eliquis. To help avoid this, consult your healthcare provider or pharmacist prior to using any new prescription or non-prescription medications, including herbals, vitamins, non-steroidal anti-inflammatory drugs (NSAIDs) and supplements.  This website has more information on Eliquis (apixaban): http://www.eliquis.com/eliquis/home  *saline nasal spray. 2 sprays each nostril as needed to prevent nasal passage way dryness and bleeding. May use afrin nasal spray (2-3 drops on a cotton ball partially inserted into nostril- leave a portion external to nare for easy removal) as needed for nose bleeds*

## 2022-04-01 DIAGNOSIS — M313 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: Secondary | ICD-10-CM | POA: Diagnosis not present

## 2022-04-01 DIAGNOSIS — Z8701 Personal history of pneumonia (recurrent): Secondary | ICD-10-CM | POA: Diagnosis not present

## 2022-04-01 DIAGNOSIS — R6520 Severe sepsis without septic shock: Secondary | ICD-10-CM | POA: Diagnosis not present

## 2022-04-01 DIAGNOSIS — A419 Sepsis, unspecified organism: Secondary | ICD-10-CM | POA: Diagnosis not present

## 2022-04-01 LAB — BRAIN NATRIURETIC PEPTIDE: B Natriuretic Peptide: 52.3 pg/mL (ref 0.0–100.0)

## 2022-04-01 LAB — COMPREHENSIVE METABOLIC PANEL
ALT: 22 U/L (ref 0–44)
AST: 20 U/L (ref 15–41)
Albumin: 2.6 g/dL — ABNORMAL LOW (ref 3.5–5.0)
Alkaline Phosphatase: 78 U/L (ref 38–126)
Anion gap: 7 (ref 5–15)
BUN: 13 mg/dL (ref 6–20)
CO2: 23 mmol/L (ref 22–32)
Calcium: 8.6 mg/dL — ABNORMAL LOW (ref 8.9–10.3)
Chloride: 111 mmol/L (ref 98–111)
Creatinine, Ser: 0.81 mg/dL (ref 0.44–1.00)
GFR, Estimated: >60 mL/min (ref >60)
Glucose, Bld: 192 mg/dL — ABNORMAL HIGH (ref 70–99)
Potassium: 4 mmol/L (ref 3.5–5.1)
Sodium: 141 mmol/L (ref 135–145)
Total Bilirubin: 0.4 mg/dL (ref 0.3–1.2)
Total Protein: 5.3 g/dL — ABNORMAL LOW (ref 6.5–8.1)

## 2022-04-01 LAB — FUNGITELL, SERUM: Fungitell Result: <31 pg/mL (ref <80)

## 2022-04-01 LAB — GLUCOSE, CAPILLARY
Glucose-Capillary: 124 mg/dL — ABNORMAL HIGH (ref 70–99)
Glucose-Capillary: 56 mg/dL — ABNORMAL LOW (ref 70–99)
Glucose-Capillary: 206 mg/dL — ABNORMAL HIGH (ref 70–99)
Glucose-Capillary: 258 mg/dL — ABNORMAL HIGH (ref 70–99)
Glucose-Capillary: 277 mg/dL — ABNORMAL HIGH (ref 70–99)
Glucose-Capillary: 211 mg/dL — ABNORMAL HIGH (ref 70–99)

## 2022-04-01 LAB — CBC WITH DIFFERENTIAL/PLATELET
Abs Immature Granulocytes: 0.5 10*3/uL — ABNORMAL HIGH (ref 0.00–0.07)
Basophils Absolute: 0 10*3/uL (ref 0.0–0.1)
Basophils Relative: 0 %
Eosinophils Absolute: 0 10*3/uL (ref 0.0–0.5)
Eosinophils Relative: 0 %
HCT: 32.4 % — ABNORMAL LOW (ref 36.0–46.0)
Hemoglobin: 9.8 g/dL — ABNORMAL LOW (ref 12.0–15.0)
Lymphocytes Relative: 25 %
Lymphs Abs: 1.9 10*3/uL (ref 0.7–4.0)
MCH: 26.1 pg (ref 26.0–34.0)
MCHC: 30.2 g/dL (ref 30.0–36.0)
MCV: 86.4 fL (ref 80.0–100.0)
Metamyelocytes Relative: 3 %
Monocytes Absolute: 0.2 10*3/uL (ref 0.1–1.0)
Monocytes Relative: 3 %
Myelocytes: 3 %
Neutro Abs: 5.1 10*3/uL (ref 1.7–7.7)
Neutrophils Relative %: 66 %
Platelets: 314 10*3/uL (ref 150–400)
RBC: 3.75 MIL/uL — ABNORMAL LOW (ref 3.87–5.11)
RDW: 22.9 % — ABNORMAL HIGH (ref 11.5–15.5)
WBC: 7.7 10*3/uL (ref 4.0–10.5)
nRBC: 0 /100 WBC
nRBC: 0.3 % — ABNORMAL HIGH (ref 0.0–0.2)

## 2022-04-01 LAB — C-REACTIVE PROTEIN: CRP: 1.4 mg/dL — ABNORMAL HIGH (ref <1.0)

## 2022-04-01 LAB — SEDIMENTATION RATE: Sed Rate: 60 mm/hr — ABNORMAL HIGH (ref 0–22)

## 2022-04-01 LAB — MAGNESIUM: Magnesium: 2.2 mg/dL (ref 1.7–2.4)

## 2022-04-01 MED ORDER — VANCOMYCIN HCL 1250 MG/250ML IV SOLN
1250.0000 mg | INTRAVENOUS | Status: DC
Start: 2022-04-01 — End: 2022-04-05
  Administered 2022-04-01 – 2022-04-04 (×4): 1250 mg via INTRAVENOUS
  Filled 2022-04-01 (×5): qty 250

## 2022-04-01 MED ORDER — SODIUM CHLORIDE 0.9 % IV SOLN
100.0000 mg | INTRAVENOUS | Status: DC
  Administered 2022-04-01: 100 mg via INTRAVENOUS
  Filled 2022-04-01 (×3): qty 5

## 2022-04-01 NOTE — Progress Notes (Signed)
NAME:  [name], MRN:  [id number], DOB:  1965/12/26, LOS: 6 ADMISSION DATE:  03/26/2022, CONSULTATION DATE:  03/28/2022 REFERRING MD:  Dr Candiss Norse, CHIEF COMPLAINT:  hemoptysis   History of Present Illness:  56 yo female was brought to [facility] ER with AMS.  SpO2 in 70's on room air.  She was noted to have fever 104F, and hypotensive.  She was started on antibiotics for pneumonia and UTI.  CT chest showed small PEs.  She was started on heparin infusion.  She had persistent hemoptysis.  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  She had sputum culture from 02/21/22 that grew MRSA.     He had recent hospitalization for myositis (02/19/22 to 02/23/22) and treated with clindamycin and steroid taper.     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  She is followed by Dr. Valentino Nose with rheumatology and Dr. Dionne Milo with pulmonary.  Pertinent  Medical History   Past Medical History:  Diagnosis Date   Chronic cough    Chronic pain    Diabetes mellitus without complication (HCC)    Dyspnea    GERD (gastroesophageal reflux disease)    Hypokalemia    Hypothyroidism    Left wrist pain 06/01/2021   Myonecrosis ([place]) 07/01/2021   Neurocardiogenic syncope    OSA (obstructive sleep apnea)    does not use CPAP   Osteomyelitis (HCC)    bilateral feet   Peripheral neuropathy    Presence of intrathecal pump    recieves Prialt/bupivicaine   Tear of gluteus medius tendon 07/01/2021   Wegener's granulomatosis 2009   Wegner's disease (congenital syphilitic osteochondritis)      Significant Hospital Events: Including procedures, antibiotic start and stop dates in addition to other pertinent events   Bronchoscopy 03/30/2022   Interim History / Subjective:   No respiratory complaints.  Complains of neuropathic pain On room air  Objective   Blood pressure (!) 88/67, pulse (!) 105, temperature 99.4 F (37.4 C), temperature source  Oral, resp. rate 16, height _0  (1.626 m), weight 91.4 kg, SpO2 95 %.    FiO2 (%):  [21 %] 21 %   Intake/Output Summary (Last 24 hours) at 04/01/2022 1440 Last data filed at 03/31/2022 2153 Gross per 24 hour  Intake 10 ml  Output --  Net 10 ml   Filed Weights   03/28/22 0411 03/28/22 2319 03/30/22 1040  Weight: 95.9 kg 91.4 kg 91.4 kg    Examination: Gen:      Mild distress due to pain HEENT:  EOMI, sclera anicteric Neck:     No masses; no thyromegaly Lungs:    Clear to auscultation bilaterally; normal respiratory effort CV:         Regular rate and rhythm; no murmurs Abd:      + bowel sounds; soft, non-tender; no palpable masses, no distension Ext:    No edema; adequate peripheral perfusion Skin:      Warm and dry; no rash Neuro: alert and oriented x 3 Psych: normal mood and affect   BAL 6/8 now showing significant Candida growth  Resolved Hospital Problem list     Assessment & Plan:  Recurrent pneumonia -On antibiotic therapy -Concern for immunocompromised pneumonia -S/p bronchoscopy BAL showing Candida.  Will discuss with ID if we need to treat -Cultures requested to be held for nocardia, actinomyces -Pending serum Fungitell -Continue meropenem and vancomycin -Continues on Bactrim  Reportedly grew Mycobacterium  gordonae from a bronchoscopy in March 2023.  Will await AFB cultures from BAL.  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 Management per primary team  Type 2 diabetes GERD Hypothyroidism Steroid-induced adrenal insufficiency  Stage IIIa chronic kidney disease Class II obesity   Best Practice (right click and "Reselect all SmartList Selections" daily)   Diet/type: Regular  consistency (see orders) DVT prophylaxis: systemic heparin GI prophylaxis: PPI Lines: N/A Foley:  N/A Code Status:  full code Last date of multidisciplinary goals of care discussion [per primary]  Signature:   Marshell Garfinkel MD [facility] See Amion for pager  If no response to pager , please call [phone] until 7pm After 7:00 pm call Elink  [phone] 04/01/2022, 2:40 PM

## 2022-04-01 NOTE — Progress Notes (Signed)
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  Diagnosed with sepsis due to pneumonia and transferred to [facility] for further care.   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  Monitor final cultures, speech therapy following, there could also be element of chronic microaspiration due to oversedation from her home medications, speech therapy on board.  Patient counseled along with husband on avoiding over sedation.  Now has received right arm PICC line on 03/31/2022 plan is to discharge home with IV antibiotics, ID will specify course and duration soon.  Likely discharge on Monday if stable  Acute pulmonary embolus ([place]) - 6/5 CTA chest--Small pulmonary emboli in RIGHT lung, stable echocardiogram with EF 55% and no RV strain.  Now placed on oral Eliquis unremarkable lower extremity venous duplex.  Granulomatosis with polyangiitis [facility]) - Patient follows with [facility]ology at [facility], Dr. Allean Found, She is on methotrexate every Sunday, Rituximab currently on hold secondary to recent sepsis. She is chronically on prednisone 5 mg daily currently on 20 mg daily.  Monitor closely .  Chronic iron deficiency anemia.  Check anemia panel,  PPI, outpatient age-appropriate work-up by PCP.  Monitor. Agreeable for transfusion if needed.  Toxic and metabolic encephalopathy.  Due to combination of #1 above and high-dose narcotics and multiple sedatives being on board.  She has been counseled not to overuse narcotics as risk of respiratory failure and intubation is high, minimize offending medications, as needed Narcan, head CT is negative and she has no focal deficits.  Mentation much improved on 03/30/2022 with adjusted medications.  Opioid dependence ([place])  - Patient has intrathecal pump with bupivacaine and ziconotide, Last refill 03/02/2022, Patient receives Nucynta 75 mg, #120; last refill 03/17/2022,  Patient is also on oxycodone 10 mg; #30, last refilled 02/23/2022.   Chemotherapy-induced peripheral neuropathy ([place]) - Patient is on gabapentin, Cymbalta, amitriptyline topiramate, trazodone, medications adjusted to reduce encephalopathy and sedation.  Presence of intrathecal pump - Patient has intrathecal pump with bupivacaine and ziconotide, Last refill 03/02/2022.  Stage 3a chronic kidney disease (HCC) Baseline creatinine 0.8-1.1, Monitor with serial BMP.  UTI (urinary tract infection) - UA > 50 WBC, culture  E.coli, continue merrem on 03/29/2022 then per ID.  Class 2 obesity- BMI 35.04, with PCP for weight loss    Family Communication:     Husband Antony Haste [phone] on 03/29/2022 at 11:11 AM, 03/30/2022, 03/31/2022  Consultants:  Pulmonary, ID  Code Status:  FULL   DVT Prophylaxis:  IV Heparin    Procedures:   Right arm PICC line placed 03/31/2022.    Bronchoscopy 03/30/2022.    Leg [place] - no DVT.   CTA - Small pulmonary emboli in RIGHT lung. Small BILATERAL pleural effusions. Patchy BILATERAL pulmonary infiltrates consistent with multifocal pneumonia greatest in RIGHT upper lobe.  TTE -  1. Left ventricular ejection fraction, by estimation, is 55%. The left ventricle has normal function. The left ventricle has no regional wall  motion abnormalities. Left ventricular diastolic parameters were normal.  2. Right ventricular systolic function is normal. The right ventricular size is normal. There is normal pulmonary artery systolic pressure.  3. The pericardial effusion is anterior to the right ventricle.  4. The mitral valve is abnormal. Trivial mitral valve regurgitation. No evidence of mitral stenosis.  5. The aortic valve is tricuspid. There is mild calcification of the aortic valve. Aortic valve regurgitation is not visualized. Aortic valve sclerosis is present, with no evidence of aortic valve stenosis.  6. The inferior vena cava is normal in size with greater than 50% respiratory variability, suggesting right atrial pressure of 3 mmHg.   Subjective:  Patient in bed, appears comfortable, denies any headache, no fever, no chest pain or pressure, no shortness of breath , no abdominal pain. No focal weakness.  Objective: Vitals:   04/01/22 0439 04/01/22 0822 04/01/22 0825 04/01/22 0829  BP: (!) 141/95   138/84  Pulse: 69   78  Resp: 18   16  Temp: 98.2 F (36.8 C)   99.9 F (37.7 C)  TempSrc: Oral   Oral  SpO2: 97% 99% 100% 96%  Weight:      Height:        Intake/Output Summary (Last 24 hours) at 04/01/2022 1046 Last data filed at 03/31/2022 2153 Gross per 24 hour  Intake 10 ml  Output --  Net 10 ml   Weight change:   Exam:  Awake Alert, No new F.N deficits, Normal affect [place].AT,PERRAL Supple Neck, No JVD,   Symmetrical Chest wall movement, Good air movement bilaterally, CTAB RRR,No Gallops, Rubs or new Murmurs,  +ve B.Sounds, Abd Soft, No tenderness,   Bilateral transmetatarsal amputations, right arm PICC line in place   Data Reviewed:  Recent Labs  Lab 03/26/22 1007 03/27/22 0535 03/29/22 0142 03/29/22 0622 03/30/22 0040 03/31/22 0751 04/01/22 0345  WBC 13.3*   < > 11.0* 8.9 9.7 6.3 7.7  HGB 9.8*   < > 9.2* 8.2* 9.0* 9.7* 9.8*  HCT 33.4*   < > 31.1* 28.1* 30.9* 33.4* 32.4*  PLT 247   < >  272 246 287 310 314  MCV 87.2   < > 86.1 86.2 87.5 86.8 86.4  MCH 25.6*   < > 25.5* 25.2* 25.5* 25.2* 26.1  MCHC 29.3*   < > 29.6* 29.2* 29.1* 29.0* 30.2  RDW 22.2*   < > 22.5* 22.5* 22.5* 22.6* 22.9*  LYMPHSABS 2.6  --   --   --  1.4 1.6 1.9  MONOABS 0.8  --   --   --  0.5 0.4 0.2  EOSABS 0.1  --   --   --  0.0 0.0 0.0  BASOSABS 0.0  --   --   --  0.0 0.1 0.0   < > = values in this interval not displayed.    Recent Labs  Lab 03/26/22 1005 03/26/22 1007 03/26/22 1007 03/26/22 1027 03/26/22 1156 03/27/22 0535 03/28/22 0402 03/29/22 0622 03/30/22 0040 03/31/22 0751 04/01/22 0345  NA  --  141   < >  --   --  143 141 140 139 142 141  K  --  3.6   < >  --   --  3.8 3.1* 3.6 3.5 3.5 4.0  CL  --  114*   < >  --   --  122* 121* 110 112* 111 111  CO2  --  22   < >  --   --  20* 18* 21* 20* 22 23  GLUCOSE  --  140*   < >  --   --  118* 148* 124* 166* 145* 192*  BUN  --  12   < >  --   --  14 11 7 8 10 13   CREATININE  --  1.11*   < >  --   --  0.86 0.69 0.81 0.93 0.76 0.81  CALCIUM  --  7.9*   < >  --   --  7.2* 7.3* 8.0* 8.2* 8.8* 8.6*  AST  --  27   < >  --   --  23  --  19 18 18 20   ALT  --  18   < >  --   --  17  --  17 19 19 22   ALKPHOS  --  87   < >  --   --  67  --  69 80 73 78  BILITOT  --  0.4   < >  --   --  0.6  --  0.5 0.4 0.3 0.4  ALBUMIN  --  2.6*   < >  --   --  2.4*  --  2.4* 2.4* 2.6* 2.6*  MG  --   --   --   --   --   --  2.0 1.9 2.0 2.4 2.2  CRP  --   --   --   --   --   --   --  5.1* 3.3* 2.7* 1.4*  PROCALCITON  --   --   --  0.32  --  0.74 0.50  --   --   --   --   LATICACIDVEN  --  2.0*  --   --  1.3  --   --   --   --   --   --   INR  --  1.1  --   --   --   --   --   --   --   --   --   HGBA1C  --   --   --  8.6*  --   --   --   --   --   --   --   BNP 243.0*  --   --   --   --   --   --   --  112.8* 41.9 52.3   < > = values in this interval not displayed.    Scheduled Meds:  amitriptyline  50 mg Oral QHS   apixaban  10 mg Oral BID   Followed by    Derrill Memo ON 04/07/2022] apixaban  5 mg Oral BID   budesonide (PULMICORT) nebulizer solution  0.5 mg Nebulization BID   Chlorhexidine Gluconate Cloth  6 each Topical Daily   DULoxetine  30 mg Oral Daily   folic acid  2 mg Oral Daily   gabapentin  600 mg Oral TID   insulin aspart  0-9 Units Subcutaneous TID WC   ipratropium-albuterol  3 mL Nebulization BID   linaclotide  145 mcg Oral QAC breakfast   pantoprazole  40 mg Oral Daily   predniSONE  20 mg Oral Q breakfast   rosuvastatin  10 mg Oral QHS   sodium chloride flush  10-40 mL Intracatheter Q12H   sulfamethoxazole-trimethoprim  1 tablet Oral Once per day on Mon Wed Fri   vitamin B-12  1,000 mcg Oral Daily   Continuous Infusions:  meropenem (MERREM) IV 1 g (04/01/22 0622)   vancomycin      Procedures/Studies: [place] EKG SITE RITE  Result Date: 03/31/2022 If Site Rite image not attached, placement could not be confirmed due to current cardiac rhythm.  VAS [place] LOWER EXTREMITY VENOUS (DVT)  Result Date: 03/30/2022  Lower Venous DVT Study Patient Name:  MERRIE MONTECINOS  Date of Exam:   03/30/2022 Medical Rec #: [id number]    Accession #:    [id number] Date of Birth: Aug 29, 1966    Patient Gender: F Patient Age:   64 years Exam Location:  [facility] Procedure:      VAS [place] LOWER EXTREMITY VENOUS (DVT) Referring Phys: Deno Etienne [facility] --------------------------------------------------------------------------------  Indications: Swelling, pulmonary embolism, and Elevated D-dimer.  Performing Technologist: Bobetta Lime  Examination Guidelines: A complete evaluation includes B-mode imaging, spectral Doppler, color Doppler, and power Doppler as needed of all accessible portions of each vessel. Bilateral testing is considered an integral part of a complete examination. Limited examinations for reoccurring indications may be performed as noted. The reflux portion of the exam is performed with the patient in reverse Trendelenburg.   +---------+---------------+---------+-----------+----------+--------------+ RIGHT    CompressibilityPhasicitySpontaneityPropertiesThrombus Aging +---------+---------------+---------+-----------+----------+--------------+ CFV      Full           Yes      Yes                                 +---------+---------------+---------+-----------+----------+--------------+ SFJ      Full                                                        +---------+---------------+---------+-----------+----------+--------------+ FV Prox  Full                                                        +---------+---------------+---------+-----------+----------+--------------+ FV Mid   Full                                                        +---------+---------------+---------+-----------+----------+--------------+ FV DistalFull                                                        +---------+---------------+---------+-----------+----------+--------------+ PFV      Full                                                        +---------+---------------+---------+-----------+----------+--------------+ POP      Full  Yes      Yes                                 +---------+---------------+---------+-----------+----------+--------------+ PTV      Full                                                        +---------+---------------+---------+-----------+----------+--------------+ PERO     Full                                                        +---------+---------------+---------+-----------+----------+--------------+   +---------+---------------+---------+-----------+----------+--------------+ LEFT     CompressibilityPhasicitySpontaneityPropertiesThrombus Aging +---------+---------------+---------+-----------+----------+--------------+ CFV      Full           Yes      Yes                                  +---------+---------------+---------+-----------+----------+--------------+ SFJ      Full                                                        +---------+---------------+---------+-----------+----------+--------------+ FV Prox  Full                                                        +---------+---------------+---------+-----------+----------+--------------+ FV Mid   Full                                                        +---------+---------------+---------+-----------+----------+--------------+ FV DistalFull                                                        +---------+---------------+---------+-----------+----------+--------------+ PFV      Full                                                        +---------+---------------+---------+-----------+----------+--------------+ POP      Full           Yes      Yes                                 +---------+---------------+---------+-----------+----------+--------------+ PTV  Full                                                        +---------+---------------+---------+-----------+----------+--------------+ PERO     Full                                                        +---------+---------------+---------+-----------+----------+--------------+     Summary: RIGHT: - There is no evidence of deep vein thrombosis in the lower extremity.  - No cystic structure found in the popliteal fossa.  LEFT: - There is no evidence of deep vein thrombosis in the lower extremity.  - No cystic structure found in the popliteal fossa.  *See table(s) above for measurements and observations. Electronically signed by Sherald Hess MD on 03/30/2022 at 4:47:59 PM.    Final    DG CHEST PORT 1 VIEW  Result Date: 03/30/2022 CLINICAL DATA:  Status post right lung biopsy EXAM: PORTABLE CHEST 1 VIEW COMPARISON:  03/29/2022 FINDINGS: Unchanged AP portable chest radiograph with subtle right greater than left upper  lobe heterogeneous airspace opacities and probable small, layering bilateral pleural effusions. No new airspace opacity. No pneumothorax. Mild cardiomegaly. The visualized skeletal structures are unremarkable. IMPRESSION: Unchanged AP portable chest radiograph with subtle right greater than left upper lobe heterogeneous airspace opacities and probable small, layering bilateral pleural effusions. No new airspace opacity. No pneumothorax following biopsy. Electronically Signed   By: Jearld Lesch M.D.   On: 03/30/2022 13:24   DG ESOPHAGUS W SINGLE CM (SOL OR THIN BA)  Result Date: 03/30/2022 CLINICAL DATA:  Patient complaining of frequent globus sensation, regurgitation, and history of esophageal stricture. EXAM: ESOPHAGUS/BARIUM SWALLOW/TABLET STUDY TECHNIQUE: Single contrast examination was performed using thin liquid barium. This exam was performed by Corrin Parker, PA-C, and was supervised and interpreted by Dr. Malachi Pro. FLUOROSCOPY: Radiation Exposure Index (as provided by the fluoroscopic device): 25.8 mGy Kerma COMPARISON:  CT chest dated March 27, 2022. FINDINGS: Study was limited secondary to patient's mobility issues and inability to stand. Swallowing: No aspiration seen. Esophagus: Smooth focal fixed narrowing of the distal esophagus just superior to the GE junction. No mucosal irregularity or defect. Esophageal motility: Mild dysmotility manifested by intermittent tertiary contractions. Hiatal Hernia: None. Gastroesophageal reflux: None visualized. Ingested 46mm barium tablet: Became stuck and did not pass beyond the gastroesophageal junction despite giving water and barium. Other: None. IMPRESSION: 1. Short-segment stricture of the distal esophagus just superior to the GE junction which did not allow passage of a barium tablet. This is likely reflux related, although no reflux was elicited on today's study. 2. Mild esophageal dysmotility. Electronically Signed   By: Obie Dredge M.D.   On:  03/30/2022 09:27   DG Chest Port 1 View  Result Date: 03/29/2022 CLINICAL DATA:  Shortness of breath and cough. EXAM: PORTABLE CHEST 1 VIEW COMPARISON:  03/26/2022 FINDINGS: Stable cardiomediastinal contours. Lungs are suboptimally inflated. No signs of pleural effusion or edema. Persistent asymmetric airspace opacification within the right upper lobe. IMPRESSION: Persistent asymmetric airspace opacification within the right upper lobe compatible with pneumonia. Electronically Signed   By: Signa Kell M.D.   On: 03/29/2022 06:43  ECHOCARDIOGRAM COMPLETE  Result Date: 03/28/2022    ECHOCARDIOGRAM REPORT   Patient Name:   Danecia Noon Date of Exam: 03/28/2022 Medical Rec #:  [id number]   Height:       64.0 in Accession #:    [id number]  Weight:       211.4 lb Date of Birth:  1965-12-09   BSA:          2.003 m Patient Age:    36 years    BP:           121/84 mmHg Patient Gender: F           HR:           86 bpm. Exam Location:  [facility] Procedure: 2D Echo, Cardiac Doppler and Color Doppler Indications:    Pulmonary embolus  History:        Patient has no prior history of Echocardiogram examinations.                 Risk Factors:Hypertension and Dyslipidemia.  Sonographer:    Wenda Low Referring Phys: [phone] DAVID TAT IMPRESSIONS  1. Left ventricular ejection fraction, by estimation, is 55%. The left ventricle has normal function. The left ventricle has no regional wall motion abnormalities. Left ventricular diastolic parameters were normal.  2. Right ventricular systolic function is normal. The right ventricular size is normal. There is normal pulmonary artery systolic pressure.  3. The pericardial effusion is anterior to the right ventricle.  4. The mitral valve is abnormal. Trivial mitral valve regurgitation. No evidence of mitral stenosis.  5. The aortic valve is tricuspid. There is mild calcification of the aortic valve. Aortic valve regurgitation is not visualized. Aortic valve sclerosis is present,  with no evidence of aortic valve stenosis.  6. The inferior vena cava is normal in size with greater than 50% respiratory variability, suggesting right atrial pressure of 3 mmHg. FINDINGS  Left Ventricle: Left ventricular ejection fraction, by estimation, is 55%. The left ventricle has normal function. The left ventricle has no regional wall motion abnormalities. The left ventricular internal cavity size was normal in size. There is no left ventricular hypertrophy. Left ventricular diastolic parameters were normal. Right Ventricle: The right ventricular size is normal. No increase in right ventricular wall thickness. Right ventricular systolic function is normal. There is normal pulmonary artery systolic pressure. The tricuspid regurgitant velocity is 1.76 m/s, and  with an assumed right atrial pressure of 3 mmHg, the estimated right ventricular systolic pressure is 123XX123 mmHg. Left Atrium: Left atrial size was normal in size. Right Atrium: Right atrial size was normal in size. Pericardium: Trivial pericardial effusion is present. The pericardial effusion is anterior to the right ventricle. Mitral Valve: The mitral valve is abnormal. There is mild thickening of the mitral valve leaflet(s). There is mild calcification of the mitral valve leaflet(s). Trivial mitral valve regurgitation. No evidence of mitral valve stenosis. MV peak gradient, 6.5 mmHg. The mean mitral valve gradient is 3.0 mmHg. Tricuspid Valve: The tricuspid valve is normal in structure. Tricuspid valve regurgitation is trivial. No evidence of tricuspid stenosis. Aortic Valve: The aortic valve is tricuspid. There is mild calcification of the aortic valve. Aortic valve regurgitation is not visualized. Aortic valve sclerosis is present, with no evidence of aortic valve stenosis. Aortic valve mean gradient measures 5.0 mmHg. Aortic valve peak gradient measures 10.8 mmHg. Aortic valve area, by VTI measures 2.22 cm. Pulmonic Valve: The pulmonic valve was  normal in structure. Pulmonic valve regurgitation  is not visualized. No evidence of pulmonic stenosis. Aorta: The aortic root is normal in size and structure. Venous: The inferior vena cava is normal in size with greater than 50% respiratory variability, suggesting right atrial pressure of 3 mmHg. IAS/Shunts: No atrial level shunt detected by color flow Doppler.  LEFT VENTRICLE PLAX 2D LVIDd:         4.90 cm     Diastology LVIDs:         3.50 cm     LV e' medial:    7.72 cm/s LV PW:         1.00 cm     LV E/e' medial:  15.2 LV IVS:        0.90 cm     LV e' lateral:   14.60 cm/s LVOT diam:     2.00 cm     LV E/e' lateral: 8.0 LV SV:         76 LV SV Index:   38 LVOT Area:     3.14 cm  LV Volumes (MOD) LV vol d, MOD A2C: 60.2 ml LV vol d, MOD A4C: 49.4 ml LV vol s, MOD A2C: 24.4 ml LV vol s, MOD A4C: 23.8 ml LV SV MOD A2C:     35.8 ml LV SV MOD A4C:     49.4 ml LV SV MOD BP:      30.8 ml RIGHT VENTRICLE RV Basal diam:  3.15 cm RV Mid diam:    2.80 cm RV S prime:     13.40 cm/s TAPSE (M-mode): 2.3 cm LEFT ATRIUM             Index        RIGHT ATRIUM           Index LA diam:        3.70 cm 1.85 cm/m   RA Area:     15.50 cm LA Vol (A2C):   46.0 ml 22.96 ml/m  RA Volume:   37.70 ml  18.82 ml/m LA Vol (A4C):   50.9 ml 25.41 ml/m LA Biplane Vol: 50.3 ml 25.11 ml/m  AORTIC VALVE                     PULMONIC VALVE AV Area (Vmax):    2.38 cm      PV Vmax:       0.90 m/s AV Area (Vmean):   2.30 cm      PV Peak grad:  3.2 mmHg AV Area (VTI):     2.22 cm AV Vmax:           164.00 cm/s AV Vmean:          107.000 cm/s AV VTI:            0.344 m AV Peak Grad:      10.8 mmHg AV Mean Grad:      5.0 mmHg LVOT Vmax:         124.00 cm/s LVOT Vmean:        78.500 cm/s LVOT VTI:          0.243 m LVOT/AV VTI ratio: 0.71  AORTA Ao Root diam: 2.90 cm MITRAL VALVE                TRICUSPID VALVE MV Area (PHT): 3.68 cm     TR Peak grad:   12.4 mmHg MV Area VTI:   2.13 cm     TR Vmax:  176.00 cm/s MV Peak grad:  6.5 mmHg MV Mean  grad:  3.0 mmHg     SHUNTS MV Vmax:       1.27 m/s     Systemic VTI:  0.24 m MV Vmean:      77.6 cm/s    Systemic Diam: 2.00 cm MV Decel Time: 206 msec MV E velocity: 117.00 cm/s MV A velocity: 89.50 cm/s MV E/A ratio:  1.31 Jenkins Rouge MD Electronically signed by Jenkins Rouge MD Signature Date/Time: 03/28/2022/12:20:15 PM    Final    CT Angio Chest Pulmonary Embolism (PE) W or WO Contrast  Result Date: 03/27/2022 CLINICAL DATA:  Chest pain and shortness of breath for several weeks, recent pneumonia EXAM: CT ANGIOGRAPHY CHEST WITH CONTRAST TECHNIQUE: Multidetector CT imaging of the chest was performed using the standard protocol during bolus administration of intravenous contrast. Multiplanar CT image reconstructions and MIPs were obtained to evaluate the vascular anatomy. RADIATION DOSE REDUCTION: This exam was performed according to the departmental dose-optimization program which includes automated exposure control, adjustment of the mA and/or kV according to patient size and/or use of iterative reconstruction technique. CONTRAST:  64mL OMNIPAQUE IOHEXOL 300 MG/ML  SOLN IV COMPARISON:  CT chest 01/04/2022 FINDINGS: Cardiovascular: Aorta normal caliber without aneurysm or dissection. Proximal great vessels unremarkable. Heart normal appearance. No pericardial effusion. Pulmonary arteries adequately opacified. Two small filling defects are identified consistent with pulmonary embolism, in RIGHT lower lobe image 214 and in RIGHT middle lobe image 160. No additional emboli. Mediastinum/Nodes: Esophagus unremarkable. Few normal sized mediastinal lymph nodes. No thoracic adenopathy. Base of cervical region normal appearance. Lungs/Pleura: Small BILATERAL pleural effusions. BILATERAL pulmonary infiltrates, greatest in RIGHT upper lobe. No pneumothorax or mass. Upper Abdomen: Gallbladder surgically absent. Splenic deformity question related to prior infarct or surgery. Musculoskeletal: No acute osseous findings. Review  of the MIP images confirms the above findings. IMPRESSION: Small pulmonary emboli in RIGHT lung. Small BILATERAL pleural effusions. Patchy BILATERAL pulmonary infiltrates consistent with multifocal pneumonia greatest in RIGHT upper lobe. Critical Value/emergent results were called by telephone at the time of interpretation on 03/27/2022 at 1510 hrs to provider DAVID TAT MD, who verbally acknowledged these results. Electronically Signed   By: Lavonia Dana M.D.   On: 03/27/2022 15:13   CT Head Wo Contrast  Result Date: 03/26/2022 CLINICAL DATA:  Altered mental status.  Recent stroke. EXAM: CT HEAD WITHOUT CONTRAST TECHNIQUE: Contiguous axial images were obtained from the base of the skull through the vertex without intravenous contrast. RADIATION DOSE REDUCTION: This exam was performed according to the departmental dose-optimization program which includes automated exposure control, adjustment of the mA and/or kV according to patient size and/or use of iterative reconstruction technique. COMPARISON:  11/01/2020 FINDINGS: Brain: No evidence of intracranial hemorrhage, acute infarction, hydrocephalus, extra-axial collection, or mass lesion/mass effect. 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 Vascular:  No hyperdense vessel or other acute findings. Skull: No evidence of fracture or other significant bone abnormality. Sinuses/Orbits:  No acute findings. Other: None. IMPRESSION: No acute intracranial abnormality. Electronically Signed   By: Marlaine Hind M.D.   On: 03/26/2022 11:42   DG Chest Port 1 View  Result Date: 03/26/2022 CLINICAL DATA:  Sepsis. EXAM: PORTABLE CHEST 1 VIEW COMPARISON:  February 19, 2022 FINDINGS: There is infiltrate in the right mid lung. There may be infiltrate in the medial left upper lung. No pneumothorax. No other acute interval changes. The study is limited however  due to portable technique. IMPRESSION: The study is  limited due to low volume portable technique. However, there is an infiltrate in the right mid lung consistent with for pneumonia given history. There may be a more subtle smaller infiltrate in the medial left upper lung. Electronically Signed   By: Dorise Bullion III M.D.   On: 03/26/2022 10:22     LOS: 6 days   Signature  Lala Lund M.D on 04/01/2022 at 10:46 AM   -  To page go to [email or web address]

## 2022-04-01 NOTE — Progress Notes (Addendum)
[facility] for Infectious Disease    Date of Admission:  03/26/2022   Total days of antibiotics 7           ID: [name] is a 56 y.o. 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 Febrile to 101.7F this afternoon  Medications:   amitriptyline  50 mg Oral QHS   apixaban  10 mg Oral BID   Followed by   Derrill Memo ON 04/07/2022] apixaban  5 mg Oral BID   budesonide (PULMICORT) nebulizer solution  0.5 mg Nebulization BID   Chlorhexidine Gluconate Cloth  6 each Topical Daily   DULoxetine  30 mg Oral Daily   folic acid  2 mg Oral Daily   gabapentin  600 mg Oral TID   insulin aspart  0-9 Units Subcutaneous TID WC   ipratropium-albuterol  3 mL Nebulization BID   linaclotide  145 mcg Oral QAC breakfast   pantoprazole  40 mg Oral Daily   predniSONE  20 mg Oral Q breakfast   rosuvastatin  10 mg Oral QHS   sodium chloride flush  10-40 mL Intracatheter Q12H   sulfamethoxazole-trimethoprim  1 tablet Oral Once per day on Mon Wed Fri   vitamin B-12  1,000 mcg Oral Daily    Objective: Vital signs in last 24 hours: Temp:  [98.1 F (36.7 C)-101.9 F (38.8 C)] 99 F (37.2 C) (06/10 1555) Pulse Rate:  [55-105] 101 (06/10 1555) Resp:  [13-18] 13 (06/10 1555) BP: (88-159)/(67-96) 122/89 (06/10 1555) SpO2:  [95 %-100 %] 97 % (06/10 1555) FiO2 (%):  [21 %] 21 % (06/10 1740)  Physical Exam  Constitutional:  oriented to person, place, and time.  appears well-developed and well-nourished. Tearful due to pain HENT: [place]/AT, PERRLA, no scleral icterus Mouth/Throat: Oropharynx is clear and moist. No oropharyngeal exudate.  Cardiovascular: Normal rate, regular rhythm and normal heart sounds. Exam reveals no gallop and no friction rub.  No murmur heard.  Pulmonary/Chest: Effort normal and breath sounds normal. No respiratory distress.  has no wheezes.  Neck = supple, no nuchal rigidity Abdominal: Soft. Bowel sounds are normal.  exhibits no distension. There is no tenderness.  Lymphadenopathy: no cervical adenopathy. No axillary adenopathy Neurological: alert and oriented to person, place, and time.  Skin: Skin is warm and dry. No rash noted. No erythema. Small eschar along incision line of right TMA. Left TMA is well healed Psychiatric: tearful   Lab Results Recent Labs    03/31/22 0751 04/01/22 0345  WBC 6.3 7.7  HGB 9.7* 9.8*  HCT 33.4* 32.4*  NA 142 141  K 3.5 4.0  CL 111 111  CO2 22 23  BUN 10 13  CREATININE 0.76 0.81   Liver Panel Recent Labs    03/31/22 0751 04/01/22 0345  PROT 5.3* 5.3*  ALBUMIN 2.6* 2.6*  AST 18 20  ALT 19 22  ALKPHOS 73 78  BILITOT 0.3 0.4   Sedimentation Rate Recent Labs    04/01/22 0345  ESRSEDRATE 60*   C-Reactive Protein Recent Labs    03/31/22 0751 04/01/22 0345  CRP 2.7* 1.4*    Microbiology: BAL -showing 100,000 c.glabrata, and c.albicans Cytologys - yeast Studies/Results: [place] EKG SITE RITE  Result Date: 03/31/2022 If Site Rite image not attached, placement could not be confirmed due to current cardiac rhythm.    Immunocompromised host with recurrent pneumonia"  Will start on micafungin and stop meropenem since day 7 and no further gram negative organism found. Continue on vancomycin for the time being. Will follow up on fungitell and pneumocystis stain  Oi proph of bactrim TIW  [facility] for Infectious Diseases Pager:  [phone]  04/01/2022, 5:26 PM

## 2022-04-02 DIAGNOSIS — M313 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: Secondary | ICD-10-CM | POA: Diagnosis not present

## 2022-04-02 DIAGNOSIS — Z8701 Personal history of pneumonia (recurrent): Secondary | ICD-10-CM | POA: Diagnosis not present

## 2022-04-02 DIAGNOSIS — R6520 Severe sepsis without septic shock: Secondary | ICD-10-CM | POA: Diagnosis not present

## 2022-04-02 DIAGNOSIS — A419 Sepsis, unspecified organism: Secondary | ICD-10-CM | POA: Diagnosis not present

## 2022-04-02 LAB — VANCOMYCIN, PEAK: Vancomycin Pk: 41 ug/mL — ABNORMAL HIGH (ref 30–40)

## 2022-04-02 LAB — CBC WITH DIFFERENTIAL/PLATELET
Abs Immature Granulocytes: 0.4 10*3/uL — ABNORMAL HIGH (ref 0.00–0.07)
Basophils Absolute: 0 10*3/uL (ref 0.0–0.1)
Basophils Relative: 0 %
Eosinophils Absolute: 0 10*3/uL (ref 0.0–0.5)
Eosinophils Relative: 0 %
HCT: 33.5 % — ABNORMAL LOW (ref 36.0–46.0)
Hemoglobin: 10.3 g/dL — ABNORMAL LOW (ref 12.0–15.0)
Lymphocytes Relative: 29 %
Lymphs Abs: 2.6 10*3/uL (ref 0.7–4.0)
MCH: 26 pg (ref 26.0–34.0)
MCHC: 30.7 g/dL (ref 30.0–36.0)
MCV: 84.6 fL (ref 80.0–100.0)
Monocytes Absolute: 0 10*3/uL — ABNORMAL LOW (ref 0.1–1.0)
Monocytes Relative: 0 %
Myelocytes: 4 %
Neutro Abs: 6 10*3/uL (ref 1.7–7.7)
Neutrophils Relative %: 67 %
Platelets: 307 10*3/uL (ref 150–400)
RBC: 3.96 MIL/uL (ref 3.87–5.11)
RDW: 22.5 % — ABNORMAL HIGH (ref 11.5–15.5)
WBC: 9 10*3/uL (ref 4.0–10.5)
nRBC: 0.6 % — ABNORMAL HIGH (ref 0.0–0.2)
nRBC: 1 /100 WBC — ABNORMAL HIGH

## 2022-04-02 LAB — COMPREHENSIVE METABOLIC PANEL
ALT: 20 U/L (ref 0–44)
AST: 15 U/L (ref 15–41)
Albumin: 2.6 g/dL — ABNORMAL LOW (ref 3.5–5.0)
Alkaline Phosphatase: 74 U/L (ref 38–126)
Anion gap: 8 (ref 5–15)
BUN: 13 mg/dL (ref 6–20)
CO2: 24 mmol/L (ref 22–32)
Calcium: 8.6 mg/dL — ABNORMAL LOW (ref 8.9–10.3)
Chloride: 106 mmol/L (ref 98–111)
Creatinine, Ser: 0.89 mg/dL (ref 0.44–1.00)
GFR, Estimated: >60 mL/min (ref >60)
Glucose, Bld: 110 mg/dL — ABNORMAL HIGH (ref 70–99)
Potassium: 3.4 mmol/L — ABNORMAL LOW (ref 3.5–5.1)
Sodium: 138 mmol/L (ref 135–145)
Total Bilirubin: 0.5 mg/dL (ref 0.3–1.2)
Total Protein: 5.2 g/dL — ABNORMAL LOW (ref 6.5–8.1)

## 2022-04-02 LAB — MAGNESIUM: Magnesium: 2 mg/dL (ref 1.7–2.4)

## 2022-04-02 LAB — GLUCOSE, CAPILLARY
Glucose-Capillary: 98 mg/dL (ref 70–99)
Glucose-Capillary: 138 mg/dL — ABNORMAL HIGH (ref 70–99)
Glucose-Capillary: 267 mg/dL — ABNORMAL HIGH (ref 70–99)
Glucose-Capillary: 309 mg/dL — ABNORMAL HIGH (ref 70–99)

## 2022-04-02 LAB — BRAIN NATRIURETIC PEPTIDE: B Natriuretic Peptide: 50.8 pg/mL (ref 0.0–100.0)

## 2022-04-02 LAB — C-REACTIVE PROTEIN: CRP: 2.2 mg/dL — ABNORMAL HIGH (ref <1.0)

## 2022-04-02 MED ORDER — POTASSIUM CHLORIDE CRYS ER 20 MEQ PO TBCR
40.0000 meq | EXTENDED_RELEASE_TABLET | Freq: Once | ORAL | Status: AC
  Administered 2022-04-02: 40 meq via ORAL
  Filled 2022-04-02: qty 2

## 2022-04-02 MED ORDER — POTASSIUM CHLORIDE CRYS ER 20 MEQ PO TBCR
20.0000 meq | EXTENDED_RELEASE_TABLET | Freq: Once | ORAL | Status: AC
  Administered 2022-04-02: 20 meq via ORAL
  Filled 2022-04-02: qty 1

## 2022-04-02 NOTE — Progress Notes (Signed)
PHARMACY CONSULT NOTE FOR:  OUTPATIENT  PARENTERAL ANTIBIOTIC THERAPY (OPAT)  Indication: PNA in an immunocompromised host Regimen: Vanc 1250mg  IV Q24H End date: 04/08/22  IV antibiotic discharge orders are pended. To discharging provider:  please sign these orders via discharge navigator,  Select New Orders & click on the button choice - Manage This Unsigned Work.     Thank you for allowing pharmacy to be a part of this patient's care.  Trypp Heckmann D. 04/10/22, PharmD, BCPS, BCCCP 04/02/2022, 3:45 PM

## 2022-04-02 NOTE — Progress Notes (Addendum)
[name] for Infectious Disease    Date of Admission:  03/26/2022   Total days of antibiotics 8   ID: [name] is a 56 y.o. female with   Principal Problem:   Severe sepsis ([place]) Active Problems:   Presence of intrathecal pump   Chemotherapy-induced peripheral neuropathy (HCC)   Secondary adrenal insufficiency (HCC)   Stage 3a chronic kidney disease (HCC)   Lobar pneumonia (HCC)   Granulomatosis with polyangiitis (HCC)   Class 2 obesity   UTI (urinary tract infection)   Opioid dependence ([place])   Acute metabolic encephalopathy   Acute pulmonary embolus (HCC)    Subjective: Having better pain control. Had isolated fever yesterday but remains afebrile this morning. The patient remains improved from respiratory standpoint Medications:   amitriptyline  50 mg Oral QHS   apixaban  10 mg Oral BID   Followed by   Derrill Memo ON 04/07/2022] apixaban  5 mg Oral BID   budesonide (PULMICORT) nebulizer solution  0.5 mg Nebulization BID   Chlorhexidine Gluconate Cloth  6 each Topical Daily   DULoxetine  30 mg Oral Daily   folic acid  2 mg Oral Daily   gabapentin  600 mg Oral TID   insulin aspart  0-9 Units Subcutaneous TID WC   ipratropium-albuterol  3 mL Nebulization BID   linaclotide  145 mcg Oral QAC breakfast   pantoprazole  40 mg Oral Daily   predniSONE  20 mg Oral Q breakfast   rosuvastatin  10 mg Oral QHS   sodium chloride flush  10-40 mL Intracatheter Q12H   sulfamethoxazole-trimethoprim  1 tablet Oral Once per day on Mon Wed Fri   vitamin B-12  1,000 mcg Oral Daily    Objective: Vital signs in last 24 hours: Temp:  [98.8 F (37.1 C)-100.2 F (37.9 C)] 98.8 F (37.1 C) (06/11 1200) Pulse Rate:  [80-101] 90 (06/11 1200) Resp:  [13-20] 18 (06/11 1200) BP: (104-122)/(71-90) 104/71 (06/11 1200) SpO2:  [92 %-97 %] 96 % (06/11 1200) Physical Exam  Constitutional:  oriented to person, place, and time. appears well-developed and well-nourished. No distress.  HENT:  [place]/AT, PERRLA, no scleral icterus Mouth/Throat: Oropharynx is clear and moist. No oropharyngeal exudate.  Cardiovascular: Normal rate, regular rhythm and normal heart sounds. Exam reveals no gallop and no friction rub.  No murmur heard.  Pulmonary/Chest: Effort normal and breath sounds normal. No respiratory distress.  has no wheezes.  Neck = supple, no nuchal rigidity Abdominal: Soft. Bowel sounds are normal.  exhibits no distension. There is no tenderness.  Lymphadenopathy: no cervical adenopathy. No axillary adenopathy Neurological: alert and oriented to person, place, and time.  Skin: Skin is warm and dry. No rash noted. No erythema.  Psychiatric: a normal mood and affect.  behavior is normal.    Lab Results Recent Labs    04/01/22 0345 04/02/22 0415  WBC 7.7 9.0  HGB 9.8* 10.3*  HCT 32.4* 33.5*  NA 141 138  K 4.0 3.4*  CL 111 106  CO2 23 24  BUN 13 13  CREATININE 0.81 0.89   Liver Panel Recent Labs    04/01/22 0345 04/02/22 0415  PROT 5.3* 5.2*  ALBUMIN 2.6* 2.6*  AST 20 15  ALT 22 20  ALKPHOS 78 74  BILITOT 0.4 0.5   Sedimentation Rate Recent Labs    04/01/22 0345  ESRSEDRATE 60*   C-Reactive Protein Recent Labs    04/01/22 0345 04/02/22 0415  CRP 1.4* 2.2*  Microbiology: Fungitell negative 6/8 BAL fungal cx - c.glabrata and c.albicans Other cultures pending Studies/Results: [place] EKG SITE RITE  Result Date: 03/31/2022 If Site Rite image not attached, placement could not be confirmed due to current cardiac rhythm.    Assessment/Plan: Recurrent pneumonia = concern for undertreated /atypical pneumonia. She was started empirically on broad spectrum abtx however did not have bronchoscopy until day 4 of iv abtx which may impact results. Secondly, tissue culture was only sent for fungal culture. I have asked micro to add on aerobic and afb culture to tissue culture.micro to follow upon setting up culture/update. Currently on day 8 of iv vancomycin.  Recommend to finish out 14 days of iv vancomycin, then will pull line should there be no further need for it. We will follow up on her micro culture as an outpatient. Patient having recurrent aspiration could also explain some of findings.  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 Will follow repeat specimens to see if re-isolated  Less likely a flare of wegeners since recently had infusion 3 weeks ago and previous to that had 1st dose of rituximab on 02/16/22.  Candida isolated in BAL culture = in the setting of fungitell being negative, suspect she is colonized but not necessarily needs antifungal treatment at this time  Peripheral neuropathy = defer to primary team for management.  Wegener's = currently on regimen of methotrexate, prednisone and rituximab. Current pred 24m daily would go ahead and start taper  Oi proph = continue on pjp/nocardia prophylaxis with bactrim DS TIW.  Case discussed with drs. Mannan and singh. ------------------------------- Opat below: Diagnosis: pneumonia  Culture Result: pending. Recent MRSA  Allergies  Allergen Reactions   Ibuprofen Other (See Comments)    Pt is unable to take this due to kidney problems.      Penicillins Rash and Other (See Comments)    Has patient had a PCN reaction causing immediate rash, facial/tongue/throat swelling, SOB or lightheadedness with hypotension: No Has patient had a PCN reaction causing severe rash involving mucus membranes or skin necrosis: No Has patient had a PCN reaction that required hospitalization No Has patient had a PCN reaction occurring within the last 10 years: No If all of the above answers are "NO", then may proceed with Cephalosporin use.    OPAT Orders Discharge antibiotics to be given via PICC line Discharge antibiotics: Per pharmacy protocol --vanco Aim for Vancomycin trough 15-20 or AUC 400-550 (unless otherwise  indicated) Duration: 14 days include what has been given at hospital End Date: Last dose on June 17th  PLindsayPer Protocol:  Home health RN for IV administration and teaching; PICC line care and labs.    Labs weekly while on IV antibiotics: _x_ CBC with differential _x_ BMP  _x_ Vancomycin trough   __ Please leave PIC in place until doctor has seen patient or been notified  Fax weekly labs to ([phone] Clinic Follow Up Appt: 6[facility]19 [facility] Pager: [phone]  04/02/2022, 1:49 PM

## 2022-04-02 NOTE — Progress Notes (Signed)
Pharmacy Antibiotic Note  [name] is a 56 y.o. 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  Pharmacy has been consulted for vancomycin dosing.  Followed by ID.   Scr stable since level adjustment on 6/7. WBC 7.7, afebrile.   Plan: Continue Vancomycin 1250 mg IV Q 24 hrs. Goal AUC 400-550. (Expected AUC: 456, SCr used: 0.81) Monitor clinical progress, cultures/sensitivities, renal function, abx plan F/u vancomycin level ~6/14 if still on therapy Micafungin per 100 mg x1 MD (plan for d/c Monday with OPAT per prior conversations)   Height: _0  (162.6 cm) Weight: 91.4 kg (201 lb 8 oz) IBW/kg (Calculated) : 54.7  Temp (24hrs), Avg:99.7 F (37.6 C), Min:99 F (37.2 C), Max:101.9 F (38.8 C)  Recent Labs  Lab 03/26/22 1007 03/26/22 1156 03/27/22 0535 03/29/22 0622 03/30/22 0040 03/31/22 0751 04/01/22 0345 04/02/22 0415  WBC 13.3*  --    < > 8.9 9.7 6.3 7.7 9.0  CREATININE 1.11*  --    < > 0.81 0.93 0.76 0.81 0.89  LATICACIDVEN 2.0* 1.3  --   --   --   --   --   --    < > = values in this interval not displayed.     Estimated Creatinine Clearance: 77.3 mL/min (by C-G formula based on SCr of 0.89 mg/dL).    Allergies  Allergen Reactions   Ibuprofen Other (See Comments)    Pt is unable to take this due to kidney problems.      Penicillins Rash and Other (See Comments)    Has patient had a PCN reaction causing immediate rash, facial/tongue/throat swelling, SOB or lightheadedness with hypotension: No Has patient had a PCN reaction causing severe rash involving mucus membranes or skin necrosis: No Has patient had a PCN reaction that required hospitalization No Has patient had a PCN reaction occurring within the last 10 years: No If all of the above answers are "NO", then may proceed with Cephalosporin use.    Antimicrobials this admission: PTA bactrim TIW 6/4 cefepime x 1 6/4 vanc >>  6/4  meropenem >> 6/10 6/10 micafungin>>   Dose adjustments this admission: Vancomycin: 6/7 adjusted based on AUC dosing 6/5 adjusted from 1250 q12 to 1750 q24  Microbiology results: 6/4 BCx: ng x 3d 6/4 UCx: E.coli 40,000 col/mL  6/4 COV/Flu: megative  6/7 MRSA PCR: negative 6/8 Pneumocystis smear: sent 6/8 Acid fast culture: sent 6/8 BAL: > 100k candida glabrata  Thank you for allowing [place] to participate in this patients care.   Cathrine Muster, PharmD PGY2 Cardiology Pharmacy Resident 04/02/2022  9:51 AM  Please check [email or web address] for unit-specific pharmacy phone numbers.

## 2022-04-02 NOTE — Progress Notes (Signed)
NAME:  [name], MRN:  [id number], DOB:  09-Feb-1966, LOS: 7 ADMISSION DATE:  03/26/2022, CONSULTATION DATE:  03/28/2022 REFERRING MD:  Dr Candiss Norse, CHIEF COMPLAINT:  hemoptysis   History of Present Illness:  56 yo female was brought to [facility] ER with AMS.  SpO2 in 70's on room air.  She was noted to have fever 104F, and hypotensive.  She was started on antibiotics for pneumonia and UTI.  CT chest showed small PEs.  She was started on heparin infusion.  She had persistent hemoptysis.  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  She had sputum culture from 02/21/22 that grew MRSA.     He had recent hospitalization for myositis (02/19/22 to 02/23/22) and treated with clindamycin and steroid taper.     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  She is followed by Dr. Valentino Nose with rheumatology and Dr. Dionne Milo with pulmonary.  Pertinent  Medical History   Past Medical History:  Diagnosis Date   Chronic cough    Chronic pain    Diabetes mellitus without complication (HCC)    Dyspnea    GERD (gastroesophageal reflux disease)    Hypokalemia    Hypothyroidism    Left wrist pain 06/01/2021   Myonecrosis ([place]) 07/01/2021   Neurocardiogenic syncope    OSA (obstructive sleep apnea)    does not use CPAP   Osteomyelitis (HCC)    bilateral feet   Peripheral neuropathy    Presence of intrathecal pump    recieves Prialt/bupivicaine   Tear of gluteus medius tendon 07/01/2021   Wegener's granulomatosis 2009   Wegner's disease (congenital syphilitic osteochondritis)      Significant Hospital Events: Including procedures, antibiotic start and stop dates in addition to other pertinent events   Bronchoscopy 03/30/2022  Interim History / Subjective:   No complaints, on room air  Objective   Blood pressure 104/71, pulse 90, temperature 98.8 F (37.1 C), temperature source Oral, resp. rate 18, height _0  (1.626 m), weight 91.4  kg, SpO2 96 %.        Intake/Output Summary (Last 24 hours) at 04/02/2022 1318 Last data filed at 04/02/2022 1202 Gross per 24 hour  Intake 250 ml  Output 800 ml  Net -550 ml   Filed Weights   03/28/22 0411 03/28/22 2319 03/30/22 1040  Weight: 95.9 kg 91.4 kg 91.4 kg    Examination: Gen:      No acute distress HEENT:  EOMI, sclera anicteric Neck:     No masses; no thyromegaly Lungs:    Clear to auscultation bilaterally; normal respiratory effort CV:         Regular rate and rhythm; no murmurs Abd:      + bowel sounds; soft, non-tender; no palpable masses, no distension Ext:    No edema; adequate peripheral perfusion Skin:      Warm and dry; no rash Neuro: alert and oriented x 3 Psych: normal mood and affect    Resolved Hospital Problem list     Assessment & Plan:  Recurrent pneumonia -Concern for immunocompromised pneumonia -S/p bronchoscopy BAL showing Candida.  But Fungitell is negative so suspicion for invasive fungal infection is low. -Cultures requested to be held for nocardia, actinomyces -PJP stain is still pending -On vancomycin.  She has finished 7 days of meropenem -Continues on Bactrim for prophylaxis - Reportedly grew Mycobacterium gordonae from a bronchoscopy in March 2023.  Will await AFB cultures  from BAL.  Discussed with Dr. Baxter Flattery from [place].  Will be reasonable to stop micafungin as Fungitell is negative.  Antibiotics for 14 days given immunocompromise status.  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 Management per primary team  Type 2 diabetes GERD Hypothyroidism Steroid-induced adrenal insufficiency  Stage IIIa chronic kidney disease Class II obesity   Best Practice (right click and "Reselect all SmartList Selections" daily)   Diet/type: Regular consistency (see orders) DVT prophylaxis: systemic heparin GI prophylaxis: PPI Lines: N/A Foley:  N/A Code Status:  full code Last date of multidisciplinary goals of care discussion [per primary]  Signature:   Marshell Garfinkel MD [facility] See Amion for pager  If no response to pager , please call [phone] until 7pm After 7:00 pm call Elink  [phone] 04/02/2022, 1:18 PM

## 2022-04-02 NOTE — Progress Notes (Signed)
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  Diagnosed with sepsis due to pneumonia and transferred to [facility] for further care.   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  Monitor final cultures, so far appears that she is colonized with Candida, Fungitell assay is negative, case discussed with ID will finish IV antibiotics and micafungin here and likely discharge her on oral medications if needed on 04/03/2022.  Speech therapy following, there could also be element of chronic microaspiration due to oversedation from her home medications, speech therapy on board.  Patient counseled along with husband on avoiding over sedation.     Acute pulmonary embolus (HCC) - 6/5 CTA chest--Small pulmonary emboli in RIGHT lung, stable echocardiogram with EF 55% and no RV strain.  Now placed on oral Eliquis unremarkable lower extremity venous duplex.  Granulomatosis with polyangiitis [facility]) - Patient follows with rheumatology at [facility], Dr. Allean Found, She is on methotrexate every Sunday, Rituximab currently on hold secondary to recent sepsis. She is chronically on prednisone 5 mg daily currently on 20 mg daily.  Monitor closely  .  Chronic iron deficiency anemia.  Check anemia panel, PPI, outpatient age-appropriate work-up by PCP.  Monitor. Agreeable for transfusion if needed.  Toxic and metabolic encephalopathy.  Due to combination of #1 above and high-dose narcotics and multiple sedatives being on board.  She has been counseled not to overuse narcotics as risk of respiratory failure and intubation is high, minimize offending medications, as needed Narcan, head CT is negative and she has no focal deficits.  Mentation much improved on 03/30/2022 with adjusted medications.  Opioid dependence ([place])  - Patient has intrathecal pump with bupivacaine and ziconotide, Last refill 03/02/2022, Patient receives Nucynta 75 mg, #120; last refill 03/17/2022,  Patient is also on oxycodone 10 mg; #30, last refilled 02/23/2022.   Chemotherapy-induced peripheral neuropathy ([place]) - Patient is on gabapentin, Cymbalta, amitriptyline topiramate, trazodone, medications adjusted to reduce encephalopathy and sedation.  Presence of intrathecal pump - Patient has intrathecal pump with bupivacaine and ziconotide, Last refill 03/02/2022.  Stage 3a chronic kidney disease (HCC) Baseline creatinine 0.8-1.1, Monitor with serial BMP.  UTI (urinary tract infection) - UA > 50 WBC, culture  E.coli, continue merrem on 03/29/2022 then per ID.  Class 2 obesity- BMI 35.04, with PCP for weight loss    Family Communication:     Husband Antony Haste [phone] on 03/29/2022 at 11:11 AM, 03/30/2022, 03/31/2022  Consultants:  Pulmonary, ID  Code Status:  FULL   DVT Prophylaxis:  IV Heparin    Procedures:   Right arm PICC line placed 03/31/2022.    Bronchoscopy 03/30/2022.    Leg [place] - no DVT.   CTA - Small pulmonary emboli in RIGHT lung. Small BILATERAL pleural effusions. Patchy BILATERAL pulmonary infiltrates consistent with multifocal pneumonia  greatest in RIGHT upper lobe.  TTE - 1. Left ventricular ejection fraction, by estimation, is 55%. The left ventricle has  normal function. The left ventricle has no regional wall motion abnormalities. Left ventricular diastolic parameters were normal.  2. Right ventricular systolic function is normal. The right ventricular size is normal. There is normal pulmonary artery systolic pressure.  3. The pericardial effusion is anterior to the right ventricle.  4. The mitral valve is abnormal. Trivial mitral valve regurgitation. No evidence of mitral stenosis.  5. The aortic valve is tricuspid. There is mild calcification of the aortic valve. Aortic valve regurgitation is not visualized. Aortic valve sclerosis is present, with no evidence of aortic valve stenosis.  6. The inferior vena cava is normal in size with greater than 50% respiratory variability, suggesting right atrial pressure of 3 mmHg.   Subjective:  Patient in bed, appears comfortable, denies any headache, no fever, no chest pain or pressure, no shortness of breath , no abdominal pain. No new focal weakness.   Objective: Vitals:   04/01/22 2309 04/02/22 0357 04/02/22 0732 04/02/22 0755  BP: 120/80 122/76  110/78  Pulse: 80 83  87  Resp: 20 17    Temp: 99.7 F (37.6 C) 99 F (37.2 C)  99.2 F (37.3 C)  TempSrc: Oral Oral  Axillary  SpO2: 97% 92% 96% 96%  Weight:      Height:        Intake/Output Summary (Last 24 hours) at 04/02/2022 0857 Last data filed at 04/02/2022 0757 Gross per 24 hour  Intake 0 ml  Output 800 ml  Net -800 ml   Weight change:   Exam:  Awake Alert, No new F.N deficits, Normal affect Boyd.AT,PERRAL Supple Neck, No JVD,   Symmetrical Chest wall movement, Good air movement bilaterally, CTAB RRR,No Gallops, Rubs or new Murmurs,  +ve B.Sounds, Abd Soft, No tenderness,   Bilateral transmetatarsal amputations, right arm PICC line in place   Data Reviewed:  Recent Labs  Lab 03/26/22 1007 03/27/22 0535 03/29/22 0622 03/30/22 0040 03/31/22 0751 04/01/22 0345 04/02/22 0415  WBC 13.3*   < > 8.9 9.7 6.3 7.7 9.0  HGB 9.8*   <  > 8.2* 9.0* 9.7* 9.8* 10.3*  HCT 33.4*   < > 28.1* 30.9* 33.4* 32.4* 33.5*  PLT 247   < > 246 287 310 314 307  MCV 87.2   < > 86.2 87.5 86.8 86.4 84.6  MCH 25.6*   < > 25.2* 25.5* 25.2* 26.1 26.0  MCHC 29.3*   < > 29.2* 29.1* 29.0* 30.2 30.7  RDW 22.2*   < > 22.5* 22.5* 22.6* 22.9* 22.5*  LYMPHSABS 2.6  --   --  1.4 1.6 1.9 2.6  MONOABS 0.8  --   --  0.5 0.4 0.2 0.0*  EOSABS 0.1  --   --  0.0 0.0 0.0 0.0  BASOSABS 0.0  --   --  0.0 0.1 0.0 0.0   < > = values in this interval not displayed.    Recent Labs  Lab 03/26/22 1005 03/26/22 1007 03/26/22 1007 03/26/22 1027 03/26/22 1156 03/27/22 0535 03/27/22 0535 03/28/22 0402 03/29/22 0622 03/30/22 0040 03/31/22 0751 04/01/22 0345 04/02/22 0415  NA  --  141   < >  --   --  143  --  141 140 139 142 141 138  K  --  3.6   < >  --   --  3.8  --  3.1* 3.6 3.5 3.5  4.0 3.4*  CL  --  114*   < >  --   --  122*  --  121* 110 112* 111 111 106  CO2  --  22   < >  --   --  20*  --  18* 21* 20* 22 23 24   GLUCOSE  --  140*   < >  --   --  118*  --  148* 124* 166* 145* 192* 110*  BUN  --  12   < >  --   --  14  --  11 7 8 10 13 13   CREATININE  --  1.11*   < >  --   --  0.86  --  0.69 0.81 0.93 0.76 0.81 0.89  CALCIUM  --  7.9*   < >  --   --  7.2*  --  7.3* 8.0* 8.2* 8.8* 8.6* 8.6*  AST  --  27   < >  --   --  23  --   --  19 18 18 20 15   ALT  --  18   < >  --   --  17  --   --  17 19 19 22 20   ALKPHOS  --  87   < >  --   --  67  --   --  69 80 73 78 74  BILITOT  --  0.4   < >  --   --  0.6  --   --  0.5 0.4 0.3 0.4 0.5  ALBUMIN  --  2.6*   < >  --   --  2.4*  --   --  2.4* 2.4* 2.6* 2.6* 2.6*  MG  --   --   --   --   --   --    < > 2.0 1.9 2.0 2.4 2.2 2.0  CRP  --   --   --   --   --   --   --   --  5.1* 3.3* 2.7* 1.4* 2.2*  PROCALCITON  --   --   --  0.32  --  0.74  --  0.50  --   --   --   --   --   LATICACIDVEN  --  2.0*  --   --  1.3  --   --   --   --   --   --   --   --   INR  --  1.1  --   --   --   --   --   --   --   --   --   --    --   HGBA1C  --   --   --  8.6*  --   --   --   --   --   --   --   --   --   BNP 243.0*  --   --   --   --   --   --   --   --  112.8* 41.9 52.3 50.8   < > = values in this interval not displayed.    Scheduled Meds:  amitriptyline  50 mg Oral QHS   apixaban  10 mg Oral BID   Followed by   Derrill Memo ON 04/07/2022] apixaban  5 mg Oral BID   budesonide (PULMICORT) nebulizer solution  0.5 mg Nebulization BID  Chlorhexidine Gluconate Cloth  6 each Topical Daily   DULoxetine  30 mg Oral Daily   folic acid  2 mg Oral Daily   gabapentin  600 mg Oral TID   insulin aspart  0-9 Units Subcutaneous TID WC   ipratropium-albuterol  3 mL Nebulization BID   linaclotide  145 mcg Oral QAC breakfast   pantoprazole  40 mg Oral Daily   potassium chloride  20 mEq Oral Once   predniSONE  20 mg Oral Q breakfast   rosuvastatin  10 mg Oral QHS   sodium chloride flush  10-40 mL Intracatheter Q12H   sulfamethoxazole-trimethoprim  1 tablet Oral Once per day on Mon Wed Fri   vitamin B-12  1,000 mcg Oral Daily   Continuous Infusions:  micafungin (MYCAMINE) 100 mg in sodium chloride 0.9 % 100 mL IVPB 100 mg (04/01/22 2055)   vancomycin 1,250 mg (04/01/22 2204)    Procedures/Studies: [place] EKG SITE RITE  Result Date: 03/31/2022 If Site Rite image not attached, placement could not be confirmed due to current cardiac rhythm.  VAS [place] LOWER EXTREMITY VENOUS (DVT)  Result Date: 03/30/2022  Lower Venous DVT Study Patient Name:  [name]  Date of Exam:   03/30/2022 Medical Rec #: [id number]    Accession #:    [id number] Date of Birth: 04-29-1966    Patient Gender: F Patient Age:   36 years Exam Location:  [facility] Procedure:      VAS [place] LOWER EXTREMITY VENOUS (DVT) Referring Phys: Deno Etienne [facility] --------------------------------------------------------------------------------  Indications: Swelling, pulmonary embolism, and Elevated D-dimer.  Performing Technologist: Bobetta Lime  Examination Guidelines: A complete  evaluation includes B-mode imaging, spectral Doppler, color Doppler, and power Doppler as needed of all accessible portions of each vessel. Bilateral testing is considered an integral part of a complete examination. Limited examinations for reoccurring indications may be performed as noted. The reflux portion of the exam is performed with the patient in reverse Trendelenburg.  +---------+---------------+---------+-----------+----------+--------------+ RIGHT    CompressibilityPhasicitySpontaneityPropertiesThrombus Aging +---------+---------------+---------+-----------+----------+--------------+ CFV      Full           Yes      Yes                                 +---------+---------------+---------+-----------+----------+--------------+ SFJ      Full                                                        +---------+---------------+---------+-----------+----------+--------------+ FV Prox  Full                                                        +---------+---------------+---------+-----------+----------+--------------+ FV Mid   Full                                                        +---------+---------------+---------+-----------+----------+--------------+ FV DistalFull                                                        +---------+---------------+---------+-----------+----------+--------------+  PFV      Full                                                        +---------+---------------+---------+-----------+----------+--------------+ POP      Full           Yes      Yes                                 +---------+---------------+---------+-----------+----------+--------------+ PTV      Full                                                        +---------+---------------+---------+-----------+----------+--------------+ PERO     Full                                                         +---------+---------------+---------+-----------+----------+--------------+   +---------+---------------+---------+-----------+----------+--------------+ LEFT     CompressibilityPhasicitySpontaneityPropertiesThrombus Aging +---------+---------------+---------+-----------+----------+--------------+ CFV      Full           Yes      Yes                                 +---------+---------------+---------+-----------+----------+--------------+ SFJ      Full                                                        +---------+---------------+---------+-----------+----------+--------------+ FV Prox  Full                                                        +---------+---------------+---------+-----------+----------+--------------+ FV Mid   Full                                                        +---------+---------------+---------+-----------+----------+--------------+ FV DistalFull                                                        +---------+---------------+---------+-----------+----------+--------------+ PFV      Full                                                        +---------+---------------+---------+-----------+----------+--------------+  POP      Full           Yes      Yes                                 +---------+---------------+---------+-----------+----------+--------------+ PTV      Full                                                        +---------+---------------+---------+-----------+----------+--------------+ PERO     Full                                                        +---------+---------------+---------+-----------+----------+--------------+     Summary: RIGHT: - There is no evidence of deep vein thrombosis in the lower extremity.  - No cystic structure found in the popliteal fossa.  LEFT: - There is no evidence of deep vein thrombosis in the lower extremity.  - No cystic structure found in the popliteal fossa.   *See table(s) above for measurements and observations. Electronically signed by Monica Martinez MD on 03/30/2022 at 4:47:59 PM.    Final    DG CHEST PORT 1 VIEW  Result Date: 03/30/2022 CLINICAL DATA:  Status post right lung biopsy EXAM: PORTABLE CHEST 1 VIEW COMPARISON:  03/29/2022 FINDINGS: Unchanged AP portable chest radiograph with subtle right greater than left upper lobe heterogeneous airspace opacities and probable small, layering bilateral pleural effusions. No new airspace opacity. No pneumothorax. Mild cardiomegaly. The visualized skeletal structures are unremarkable. IMPRESSION: Unchanged AP portable chest radiograph with subtle right greater than left upper lobe heterogeneous airspace opacities and probable small, layering bilateral pleural effusions. No new airspace opacity. No pneumothorax following biopsy. Electronically Signed   By: Delanna Ahmadi M.D.   On: 03/30/2022 13:24   DG ESOPHAGUS W SINGLE CM (SOL OR THIN BA)  Result Date: 03/30/2022 CLINICAL DATA:  Patient complaining of frequent globus sensation, regurgitation, and history of esophageal stricture. EXAM: ESOPHAGUS/BARIUM SWALLOW/TABLET STUDY TECHNIQUE: Single contrast examination was performed using thin liquid barium. This exam was performed by Gareth Eagle, PA-C, and was supervised and interpreted by Dr. Fabiola Backer. FLUOROSCOPY: Radiation Exposure Index (as provided by the fluoroscopic device): 25.8 mGy Kerma COMPARISON:  CT chest dated March 27, 2022. FINDINGS: Study was limited secondary to patient's mobility issues and inability to stand. Swallowing: No aspiration seen. Esophagus: Smooth focal fixed narrowing of the distal esophagus just superior to the GE junction. No mucosal irregularity or defect. Esophageal motility: Mild dysmotility manifested by intermittent tertiary contractions. Hiatal Hernia: None. Gastroesophageal reflux: None visualized. Ingested 44mm barium tablet: Became stuck and did not pass beyond the  gastroesophageal junction despite giving water and barium. Other: None. IMPRESSION: 1. Short-segment stricture of the distal esophagus just superior to the GE junction which did not allow passage of a barium tablet. This is likely reflux related, although no reflux was elicited on today's study. 2. Mild esophageal dysmotility. Electronically Signed   By: Titus Dubin M.D.   On: 03/30/2022 09:27   DG Chest Port 1 View  Result Date: 03/29/2022 CLINICAL DATA:  Shortness of breath  and cough. EXAM: PORTABLE CHEST 1 VIEW COMPARISON:  03/26/2022 FINDINGS: Stable cardiomediastinal contours. Lungs are suboptimally inflated. No signs of pleural effusion or edema. Persistent asymmetric airspace opacification within the right upper lobe. IMPRESSION: Persistent asymmetric airspace opacification within the right upper lobe compatible with pneumonia. Electronically Signed   By: Kerby Moors M.D.   On: 03/29/2022 06:43   ECHOCARDIOGRAM COMPLETE  Result Date: 03/28/2022    ECHOCARDIOGRAM REPORT   Patient Name:   [name] Date of Exam: 03/28/2022 Medical Rec #:  [id number]   Height:       64.0 in Accession #:    [id number]  Weight:       211.4 lb Date of Birth:  01-03-1966   BSA:          2.003 m Patient Age:    5 years    BP:           121/84 mmHg Patient Gender: F           HR:           86 bpm. Exam Location:  [facility] Procedure: 2D Echo, Cardiac Doppler and Color Doppler Indications:    Pulmonary embolus  History:        Patient has no prior history of Echocardiogram examinations.                 Risk Factors:Hypertension and Dyslipidemia.  Sonographer:    Wenda Low Referring Phys: [phone] DAVID TAT IMPRESSIONS  1. Left ventricular ejection fraction, by estimation, is 55%. The left ventricle has normal function. The left ventricle has no regional wall motion abnormalities. Left ventricular diastolic parameters were normal.  2. Right ventricular systolic function is normal. The right ventricular size is normal.  There is normal pulmonary artery systolic pressure.  3. The pericardial effusion is anterior to the right ventricle.  4. The mitral valve is abnormal. Trivial mitral valve regurgitation. No evidence of mitral stenosis.  5. The aortic valve is tricuspid. There is mild calcification of the aortic valve. Aortic valve regurgitation is not visualized. Aortic valve sclerosis is present, with no evidence of aortic valve stenosis.  6. The inferior vena cava is normal in size with greater than 50% respiratory variability, suggesting right atrial pressure of 3 mmHg. FINDINGS  Left Ventricle: Left ventricular ejection fraction, by estimation, is 55%. The left ventricle has normal function. The left ventricle has no regional wall motion abnormalities. The left ventricular internal cavity size was normal in size. There is no left ventricular hypertrophy. Left ventricular diastolic parameters were normal. Right Ventricle: The right ventricular size is normal. No increase in right ventricular wall thickness. Right ventricular systolic function is normal. There is normal pulmonary artery systolic pressure. The tricuspid regurgitant velocity is 1.76 m/s, and  with an assumed right atrial pressure of 3 mmHg, the estimated right ventricular systolic pressure is 123XX123 mmHg. Left Atrium: Left atrial size was normal in size. Right Atrium: Right atrial size was normal in size. Pericardium: Trivial pericardial effusion is present. The pericardial effusion is anterior to the right ventricle. Mitral Valve: The mitral valve is abnormal. There is mild thickening of the mitral valve leaflet(s). There is mild calcification of the mitral valve leaflet(s). Trivial mitral valve regurgitation. No evidence of mitral valve stenosis. MV peak gradient, 6.5 mmHg. The mean mitral valve gradient is 3.0 mmHg. Tricuspid Valve: The tricuspid valve is normal in structure. Tricuspid valve regurgitation is trivial. No evidence of tricuspid stenosis. Aortic Valve:  The aortic  valve is tricuspid. There is mild calcification of the aortic valve. Aortic valve regurgitation is not visualized. Aortic valve sclerosis is present, with no evidence of aortic valve stenosis. Aortic valve mean gradient measures 5.0 mmHg. Aortic valve peak gradient measures 10.8 mmHg. Aortic valve area, by VTI measures 2.22 cm. Pulmonic Valve: The pulmonic valve was normal in structure. Pulmonic valve regurgitation is not visualized. No evidence of pulmonic stenosis. Aorta: The aortic root is normal in size and structure. Venous: The inferior vena cava is normal in size with greater than 50% respiratory variability, suggesting right atrial pressure of 3 mmHg. IAS/Shunts: No atrial level shunt detected by color flow Doppler.  LEFT VENTRICLE PLAX 2D LVIDd:         4.90 cm     Diastology LVIDs:         3.50 cm     LV e' medial:    7.72 cm/s LV PW:         1.00 cm     LV E/e' medial:  15.2 LV IVS:        0.90 cm     LV e' lateral:   14.60 cm/s LVOT diam:     2.00 cm     LV E/e' lateral: 8.0 LV SV:         76 LV SV Index:   38 LVOT Area:     3.14 cm  LV Volumes (MOD) LV vol d, MOD A2C: 60.2 ml LV vol d, MOD A4C: 49.4 ml LV vol s, MOD A2C: 24.4 ml LV vol s, MOD A4C: 23.8 ml LV SV MOD A2C:     35.8 ml LV SV MOD A4C:     49.4 ml LV SV MOD BP:      30.8 ml RIGHT VENTRICLE RV Basal diam:  3.15 cm RV Mid diam:    2.80 cm RV S prime:     13.40 cm/s TAPSE (M-mode): 2.3 cm LEFT ATRIUM             Index        RIGHT ATRIUM           Index LA diam:        3.70 cm 1.85 cm/m   RA Area:     15.50 cm LA Vol (A2C):   46.0 ml 22.96 ml/m  RA Volume:   37.70 ml  18.82 ml/m LA Vol (A4C):   50.9 ml 25.41 ml/m LA Biplane Vol: 50.3 ml 25.11 ml/m  AORTIC VALVE                     PULMONIC VALVE AV Area (Vmax):    2.38 cm      PV Vmax:       0.90 m/s AV Area (Vmean):   2.30 cm      PV Peak grad:  3.2 mmHg AV Area (VTI):     2.22 cm AV Vmax:           164.00 cm/s AV Vmean:          107.000 cm/s AV VTI:            0.344 m AV  Peak Grad:      10.8 mmHg AV Mean Grad:      5.0 mmHg LVOT Vmax:         124.00 cm/s LVOT Vmean:        78.500 cm/s LVOT VTI:          0.243 m  LVOT/AV VTI ratio: 0.71  AORTA Ao Root diam: 2.90 cm MITRAL VALVE                TRICUSPID VALVE MV Area (PHT): 3.68 cm     TR Peak grad:   12.4 mmHg MV Area VTI:   2.13 cm     TR Vmax:        176.00 cm/s MV Peak grad:  6.5 mmHg MV Mean grad:  3.0 mmHg     SHUNTS MV Vmax:       1.27 m/s     Systemic VTI:  0.24 m MV Vmean:      77.6 cm/s    Systemic Diam: 2.00 cm MV Decel Time: 206 msec MV E velocity: 117.00 cm/s MV A velocity: 89.50 cm/s MV E/A ratio:  1.31 Jenkins Rouge MD Electronically signed by Jenkins Rouge MD Signature Date/Time: 03/28/2022/12:20:15 PM    Final    CT Angio Chest Pulmonary Embolism (PE) W or WO Contrast  Result Date: 03/27/2022 CLINICAL DATA:  Chest pain and shortness of breath for several weeks, recent pneumonia EXAM: CT ANGIOGRAPHY CHEST WITH CONTRAST TECHNIQUE: Multidetector CT imaging of the chest was performed using the standard protocol during bolus administration of intravenous contrast. Multiplanar CT image reconstructions and MIPs were obtained to evaluate the vascular anatomy. RADIATION DOSE REDUCTION: This exam was performed according to the departmental dose-optimization program which includes automated exposure control, adjustment of the mA and/or kV according to patient size and/or use of iterative reconstruction technique. CONTRAST:  55mL OMNIPAQUE IOHEXOL 300 MG/ML  SOLN IV COMPARISON:  CT chest 01/04/2022 FINDINGS: Cardiovascular: Aorta normal caliber without aneurysm or dissection. Proximal great vessels unremarkable. Heart normal appearance. No pericardial effusion. Pulmonary arteries adequately opacified. Two small filling defects are identified consistent with pulmonary embolism, in RIGHT lower lobe image 214 and in RIGHT middle lobe image 160. No additional emboli. Mediastinum/Nodes: Esophagus unremarkable. Few normal sized  mediastinal lymph nodes. No thoracic adenopathy. Base of cervical region normal appearance. Lungs/Pleura: Small BILATERAL pleural effusions. BILATERAL pulmonary infiltrates, greatest in RIGHT upper lobe. No pneumothorax or mass. Upper Abdomen: Gallbladder surgically absent. Splenic deformity question related to prior infarct or surgery. Musculoskeletal: No acute osseous findings. Review of the MIP images confirms the above findings. IMPRESSION: Small pulmonary emboli in RIGHT lung. Small BILATERAL pleural effusions. Patchy BILATERAL pulmonary infiltrates consistent with multifocal pneumonia greatest in RIGHT upper lobe. Critical Value/emergent results were called by telephone at the time of interpretation on 03/27/2022 at 1510 hrs to provider DAVID TAT MD, who verbally acknowledged these results. Electronically Signed   By: Lavonia Dana M.D.   On: 03/27/2022 15:13   CT Head Wo Contrast  Result Date: 03/26/2022 CLINICAL DATA:  Altered mental status.  Recent stroke. EXAM: CT HEAD WITHOUT CONTRAST TECHNIQUE: Contiguous axial images were obtained from the base of the skull through the vertex without intravenous contrast. RADIATION DOSE REDUCTION: This exam was performed according to the departmental dose-optimization program which includes automated exposure control, adjustment of the mA and/or kV according to patient size and/or use of iterative reconstruction technique. COMPARISON:  11/01/2020 FINDINGS: Brain: No evidence of intracranial hemorrhage, acute infarction, hydrocephalus, extra-axial collection, or mass lesion/mass effect. 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 Vascular:  No hyperdense vessel or other acute findings. Skull: No evidence of fracture or other significant bone abnormality. Sinuses/Orbits:  No acute findings. Other: None. IMPRESSION: No acute intracranial abnormality. Electronically Signed   By:  Marlaine Hind M.D.   On:  03/26/2022 11:42   DG Chest Port 1 View  Result Date: 03/26/2022 CLINICAL DATA:  Sepsis. EXAM: PORTABLE CHEST 1 VIEW COMPARISON:  February 19, 2022 FINDINGS: There is infiltrate in the right mid lung. There may be infiltrate in the medial left upper lung. No pneumothorax. No other acute interval changes. The study is limited however due to portable technique. IMPRESSION: The study is limited due to low volume portable technique. However, there is an infiltrate in the right mid lung consistent with for pneumonia given history. There may be a more subtle smaller infiltrate in the medial left upper lung. Electronically Signed   By: Dorise Bullion III M.D.   On: 03/26/2022 10:22     LOS: 7 days   Signature  Lala Lund M.D on 04/02/2022 at 8:57 AM   -  To page go to [email or web address]

## 2022-04-02 NOTE — Plan of Care (Signed)

## 2022-04-03 ENCOUNTER — Inpatient Hospital Stay (HOSPITAL_COMMUNITY): Payer: BC Managed Care – PPO

## 2022-04-03 DIAGNOSIS — Z8701 Personal history of pneumonia (recurrent): Secondary | ICD-10-CM | POA: Diagnosis not present

## 2022-04-03 DIAGNOSIS — J189 Pneumonia, unspecified organism: Secondary | ICD-10-CM | POA: Diagnosis not present

## 2022-04-03 DIAGNOSIS — R6520 Severe sepsis without septic shock: Secondary | ICD-10-CM | POA: Diagnosis not present

## 2022-04-03 DIAGNOSIS — M313 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: Secondary | ICD-10-CM | POA: Diagnosis not present

## 2022-04-03 DIAGNOSIS — A419 Sepsis, unspecified organism: Secondary | ICD-10-CM | POA: Diagnosis not present

## 2022-04-03 LAB — URINALYSIS, ROUTINE W REFLEX MICROSCOPIC
Bilirubin Urine: NEGATIVE
Glucose, UA: NEGATIVE mg/dL
Ketones, ur: NEGATIVE mg/dL
Leukocytes,Ua: NEGATIVE
Nitrite: NEGATIVE
Protein, ur: 100 mg/dL — AB
Specific Gravity, Urine: 1.024 (ref 1.005–1.030)
pH: 5 (ref 5.0–8.0)

## 2022-04-03 LAB — GLUCOSE, CAPILLARY
Glucose-Capillary: 104 mg/dL — ABNORMAL HIGH (ref 70–99)
Glucose-Capillary: 227 mg/dL — ABNORMAL HIGH (ref 70–99)
Glucose-Capillary: 299 mg/dL — ABNORMAL HIGH (ref 70–99)
Glucose-Capillary: 313 mg/dL — ABNORMAL HIGH (ref 70–99)

## 2022-04-03 LAB — CBC
HCT: 38.6 % (ref 36.0–46.0)
Hemoglobin: 11.4 g/dL — ABNORMAL LOW (ref 12.0–15.0)
MCH: 25.1 pg — ABNORMAL LOW (ref 26.0–34.0)
MCHC: 29.5 g/dL — ABNORMAL LOW (ref 30.0–36.0)
MCV: 84.8 fL (ref 80.0–100.0)
Platelets: 320 10*3/uL (ref 150–400)
RBC: 4.55 MIL/uL (ref 3.87–5.11)
RDW: 23.1 % — ABNORMAL HIGH (ref 11.5–15.5)
WBC: 10.1 10*3/uL (ref 4.0–10.5)
nRBC: 0 % (ref 0.0–0.2)

## 2022-04-03 LAB — SURGICAL PATHOLOGY

## 2022-04-03 LAB — VANCOMYCIN, TROUGH: Vancomycin Tr: 7 ug/mL — ABNORMAL LOW (ref 15–20)

## 2022-04-03 MED ORDER — SODIUM CHLORIDE 0.9 % IV SOLN: 25.0000 mg | Freq: Four times a day (QID) | INTRAVENOUS | Status: DC | PRN

## 2022-04-03 MED ORDER — DIPHENHYDRAMINE HCL 50 MG/ML IJ SOLN
25.0000 mg | Freq: Four times a day (QID) | INTRAMUSCULAR | Status: DC | PRN
Start: 2022-04-03 — End: 2022-04-05

## 2022-04-03 MED ORDER — SODIUM CHLORIDE 0.9 % IV SOLN
1.0000 g | INTRAVENOUS | Status: DC
  Administered 2022-04-03: 1 g via INTRAVENOUS
  Filled 2022-04-03: qty 10

## 2022-04-03 MED ORDER — SODIUM CHLORIDE 0.9 % IV SOLN
2.0000 g | Freq: Three times a day (TID) | INTRAVENOUS | Status: DC
  Administered 2022-04-03 – 2022-04-05 (×7): 2 g via INTRAVENOUS
  Filled 2022-04-03 (×7): qty 12.5

## 2022-04-03 NOTE — Progress Notes (Signed)
Pharmacy Antibiotic Note  [name] is a 56 y.o. 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  Pharmacy has been consulted for vancomycin dosing.  Followed by ID.   Vanc peak 41, vanc trough 7>>AUC 497  Plan: Continue Vancomycin 1250 mg IV Q 24 hrs.  Micafungin per 100 mg x1 MD (plan for d/c Monday with OPAT per prior conversations)   Height: _0  (162.6 cm) Weight: 91.4 kg (201 lb 8 oz) IBW/kg (Calculated) : 54.7  Temp (24hrs), Avg:99.7 F (37.6 C), Min:97.8 F (36.6 C), Max:102.8 F (39.3 C)  Recent Labs  Lab 03/29/22 0622 03/30/22 0040 03/31/22 0751 04/01/22 0345 04/02/22 0415 04/02/22 2120 04/03/22 0910 04/03/22 1800  WBC 8.9 9.7 6.3 7.7 9.0  --  10.1  --   CREATININE 0.81 0.93 0.76 0.81 0.89  --   --   --   VANCOTROUGH  --   --   --   --   --   --   --  7*  VANCOPEAK  --   --   --   --   --  41*  --   --      Estimated Creatinine Clearance: 77.3 mL/min (by C-G formula based on SCr of 0.89 mg/dL).    Allergies  Allergen Reactions   Ibuprofen Other (See Comments)    Pt is unable to take this due to kidney problems.      Penicillins Rash and Other (See Comments)    Has patient had a PCN reaction causing immediate rash, facial/tongue/throat swelling, SOB or lightheadedness with hypotension: No Has patient had a PCN reaction causing severe rash involving mucus membranes or skin necrosis: No Has patient had a PCN reaction that required hospitalization No Has patient had a PCN reaction occurring within the last 10 years: No If all of the above answers are "NO", then may proceed with Cephalosporin use.    Antimicrobials this admission: PTA bactrim TIW 6/4 cefepime x 1 6/4 vanc >>  6/4 meropenem >> 6/10 6/10 micafungin>>   Dose adjustments this admission: Vancomycin: 6/7 adjusted based on AUC dosing 6/5 adjusted from 1250 q12 to 1750 q24 6/12 VP/VT>>AUC 497  Microbiology  results: 6/4 BCx: ng x 3d 6/4 UCx: E.coli 40,000 col/mL  6/4 COV/Flu: megative  6/7 MRSA PCR: negative 6/8 Pneumocystis smear: sent 6/8 Acid fast culture: sent 6/8 BAL: > 100k candida glabrata  Onnie Boer, PharmD, BCIDP, AAHIVP, CPP Infectious Disease Pharmacist 04/03/2022 7:17 PM

## 2022-04-03 NOTE — Progress Notes (Signed)
[facility] Infect[facility]    Date of Admission:  03/26/2022   Total days of antibiotics 9   ID: [name] is a 56 y.o. 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 Also new onset of dysuria ROS: pain to right TMA with eschar   Medications:   amitriptyline  50 mg Oral QHS   apixaban  10 mg Oral BID   Followed by   Derrill Memo ON 04/07/2022] apixaban  5 mg Oral BID   budesonide (PULMICORT) nebulizer solution  0.5 mg Nebulization BID   Chlorhexidine Gluconate Cloth  6 each Topical Daily   DULoxetine  30 mg Oral Daily   folic acid  2 mg Oral Daily   gabapentin  600 mg Oral TID   insulin aspart  0-9 Units Subcutaneous TID WC   ipratropium-albuterol  3 mL Nebulization BID   linaclotide  145 mcg Oral QAC breakfast   pantoprazole  40 mg Oral Daily   predniSONE  20 mg Oral Q breakfast   rosuvastatin  10 mg Oral QHS   sodium chloride flush  10-40 mL Intracatheter Q12H   sulfamethoxazole-trimethoprim  1 tablet Oral Once per day on Mon Wed Fri   vitamin B-12  1,000 mcg Oral Daily    Objective: Vital signs in last 24 hours: Temp:  [97.8 F (36.6 C)-102.8 F (39.3 C)] 97.8 F (36.6 C) (06/12 1629) Pulse Rate:  [82-109] 82 (06/12 1629) Resp:  [16-18] 18 (06/12 1629) BP: (94-113)/(75-87) 113/85 (06/12 1629) SpO2:  [85 %-99 %] 99 % (06/12 1629)  Physical Exam  Constitutional:  oriented to person, place, and time. appears well-developed and well-nourished. No distress.   HENT: [place]/AT, PERRLA, no scleral icterus Mouth/Throat: Oropharynx is clear and moist. No oropharyngeal exudate.  Cardiovascular: Normal rate, regular rhythm and normal heart sounds. Exam reveals no gallop and no friction rub.  No murmur heard.  Pulmonary/Chest: Effort normal and breath sounds normal. No respiratory distress.  has no wheezes.  Neck = supple, no nuchal rigidity Abdominal: Soft. Bowel sounds are normal.  exhibits no distension. There is no tenderness.  Lymphadenopathy: no cervical adenopathy. No axillary adenopathy Neurological: alert and oriented to person, place, and time.  Skin: Skin is warm and dry. No rash noted. No erythema.  Psychiatric: a normal mood and affect.  behavior is normal.  IHK:[id number] TMA. Right TMA no surrounding erythema, small eschar   Lab Results Recent Labs    04/01/22 0345 04/02/22 0415 04/03/22 0910  WBC 7.7 9.0 10.1  HGB 9.8* 10.3* 11.4*  HCT 32.4* 33.5* 38.6  NA 141 138  --   K 4.0 3.4*  --   CL 111 106  --   CO2 23 24  --   BUN 13 13  --   CREATININE 0.81 0.89  --    Liver Panel Recent Labs    04/01/22 0345 04/02/22 0415  PROT 5.3* 5.2*  ALBUMIN 2.6* 2.6*  AST 20 15  ALT 22 20  ALKPHOS 78 74  BILITOT 0.4 0.5   Sedimentation Rate Recent Labs    04/01/22 0345  ESRSEDRATE 60*   C-Reactive Protein Recent Labs    04/01/22 0345 04/02/22 0415  CRP 1.4* 2.2*    Microbiology: reviewed Studies/Results: DG Foot 2 Views Right  Result Date: 04/03/2022 CLINICAL DATA:  Right foot ulcer EXAM: RIGHT FOOT - 2 VIEW COMPARISON:  None Available. FINDINGS: No acute fracture or dislocation. Prior transmetatarsal amputation. Small plantar calcaneal spur. Enthesopathic changes of the Achilles tendon insertion. No aggressive osseous lesion. Normal alignment. Soft tissue are unremarkable. No radiopaque foreign body or soft tissue emphysema. IMPRESSION: 1. Prior transmetatarsal amputation. 2. No evidence of osteomyelitis of the right  foot. Electronically Signed   By: Kathreen Devoid M.D.   On: 04/03/2022 15:45   DG Chest Port 1 View  Result Date: 04/03/2022 CLINICAL DATA:  Severe sepsis, shortness of breath EXAM: PORTABLE CHEST 1 VIEW COMPARISON:  Radiograph 03/30/2022 FINDINGS: Note that the exam is mislabeled right versus left. There is a right upper extremity PICC with tip overlying the distal superior vena cava. Unchanged cardiomediastinal silhouette. Interval improvement in bilateral airspace opacities with faint residual opacity seen in the right lung. No large effusion. No pneumothorax. No acute osseous abnormality. IMPRESSION: Interval improvement in bilateral airspace disease, with mild residual opacities in the right lung. Electronically Signed   By: Maurine Simmering M.D.   On: 04/03/2022 08:36     Assessment/Plan: Agree with expanding abtx to include gram negative coverage. Hx of R-ecoli thus started on cefepime. For possible urinary tract infection  Continue on vancomycin for pneumonia, and follow up on BAL cultures.  Wegener's = currently on pred 5m   [facility] Pager: [phone]  04/03/2022, 5:22 PM

## 2022-04-03 NOTE — Progress Notes (Signed)
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  Diagnosed with sepsis due to pneumonia and transferred to [facility] for further care.   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  Monitor final cultures, so far appears that she is colonized with Candida, Fungitell assay is negative, case discussed with ID will finish IV antibiotics and micafungin here and likely discharge her on oral medications if needed on 04/03/2022.  Speech therapy following, there could also be element of chronic microaspiration due to oversedation from her home medications, speech therapy on board.  Patient counseled along with husband on avoiding over sedation.     New onset of intense dysuria with fevers on 04/03/2022.  Blood cultures, UA with culture, blood cultures, bladder scan, Rocephin IV added to IV vancomycin which is for #1 above, monitor urine cultures.     Acute pulmonary embolus (HCC) - 6/5 CTA chest--Small pulmonary emboli in RIGHT lung, stable echocardiogram with EF 55% and no RV strain.  Now placed on oral Eliquis unremarkable lower extremity venous duplex.  Granulomatosis with polyangiitis [facility]) - Patient follows with rheumatology at  [facility], Dr. Allean Found, She is on methotrexate every Sunday, Rituximab currently on hold secondary to recent sepsis. She is chronically on prednisone 5 mg daily currently on 20 mg daily.  Monitor closely .  Chronic iron deficiency anemia.  Check anemia panel, PPI, outpatient age-appropriate work-up by PCP.  Monitor. Agreeable for transfusion if needed.  Toxic and metabolic encephalopathy.  Due to combination of #1 above and high-dose narcotics and multiple sedatives being on board.  She has been counseled not to overuse narcotics as risk of respiratory failure and intubation is high, minimize offending medications, as needed Narcan, head CT is negative and she has no focal deficits.  Mentation much improved on 03/30/2022 with adjusted medications.  Opioid dependence ([place])  - Patient has intrathecal pump with bupivacaine and ziconotide, Last refill 03/02/2022, Patient receives Nucynta 75 mg, #120; last refill 03/17/2022,  Patient is also on oxycodone 10 mg; #30, last refilled 02/23/2022.   Chemotherapy-induced peripheral neuropathy ([place]) - Patient is on gabapentin, Cymbalta, amitriptyline topiramate, trazodone, medications adjusted to reduce encephalopathy and sedation.  Presence of intrathecal pump - Patient has intrathecal pump with bupivacaine and ziconotide, Last refill 03/02/2022.  Stage 3a chronic kidney disease (HCC) Baseline creatinine 0.8-1.1, Monitor with serial BMP.  UTI (urinary tract infection) - UA > 50 WBC, culture  E.coli, continue merrem on 03/29/2022 then per ID.  Class 2 obesity- BMI 35.04, with PCP for weight loss    Family Communication:     Husband Antony Haste [phone] on 03/29/2022 at 11:11 AM, 03/30/2022, 03/31/2022  Consultants:  Pulmonary, ID  Code Status:  FULL   DVT Prophylaxis:  IV Heparin    Procedures:   Right arm PICC line  placed 03/31/2022.    Bronchoscopy 03/30/2022.    Leg [place] - no DVT.   CTA - Small pulmonary emboli in RIGHT lung. Small BILATERAL pleural effusions.  Patchy BILATERAL pulmonary infiltrates consistent with multifocal pneumonia greatest in RIGHT upper lobe.  TTE - 1. Left ventricular ejection fraction, by estimation, is 55%. The left ventricle has normal function. The left ventricle has no regional wall motion abnormalities. Left ventricular diastolic parameters were normal.  2. Right ventricular systolic function is normal. The right ventricular size is normal. There is normal pulmonary artery systolic pressure.  3. The pericardial effusion is anterior to the right ventricle.  4. The mitral valve is abnormal. Trivial mitral valve regurgitation. No evidence of mitral stenosis.  5. The aortic valve is tricuspid. There is mild calcification of the aortic valve. Aortic valve regurgitation is not visualized. Aortic valve sclerosis is present, with no evidence of aortic valve stenosis.  6. The inferior vena cava is normal in size with greater than 50% respiratory variability, suggesting right atrial pressure of 3 mmHg.   Subjective: Patient seen in bed denies any headache, no chest or abdominal pain, mild cough but no shortness of breath, is developing some dysuria since last evening, intense burning when she pees   Objective: Vitals:   04/02/22 2048 04/02/22 2335 04/03/22 0407 04/03/22 0753  BP:  103/84 105/75 111/87  Pulse:  91 85 (!) 109  Resp:  18 16 18   Temp:  98.2 F (36.8 C) 98.6 F (37 C) (!) 102.8 F (39.3 C)  TempSrc:  Oral Oral Oral  SpO2: 99% 98% (!) 85% 96%  Weight:      Height:        Intake/Output Summary (Last 24 hours) at 04/03/2022 [id number] Last data filed at 04/02/2022 2037 Gross per 24 hour  Intake 380 ml  Output --  Net 380 ml   Weight change:   Exam:  Awake Alert, No new F.N deficits, Normal affect [place].AT,PERRAL Supple Neck, No JVD,   Symmetrical Chest wall movement, Good air movement bilaterally, CTAB RRR,No Gallops, Rubs or new Murmurs,  +ve B.Sounds, Abd Soft, No tenderness,   Bilateral transmetatarsal  amputations, right arm PICC line in place   Data Reviewed:  Recent Labs  Lab 03/29/22 0622 03/30/22 0040 03/31/22 0751 04/01/22 0345 04/02/22 0415  WBC 8.9 9.7 6.3 7.7 9.0  HGB 8.2* 9.0* 9.7* 9.8* 10.3*  HCT 28.1* 30.9* 33.4* 32.4* 33.5*  PLT 246 287 310 314 307  MCV 86.2 87.5 86.8 86.4 84.6  MCH 25.2* 25.5* 25.2* 26.1 26.0  MCHC 29.2* 29.1* 29.0* 30.2 30.7  RDW 22.5* 22.5* 22.6* 22.9* 22.5*  LYMPHSABS  --  1.4 1.6 1.9 2.6  MONOABS  --  0.5 0.4 0.2 0.0*  EOSABS  --  0.0 0.0 0.0 0.0  BASOSABS  --  0.0 0.1 0.0 0.0    Recent Labs  Lab 03/28/22 0402 03/29/22 0622 03/30/22 0040 03/31/22 0751 04/01/22 0345 04/02/22 0415  NA 141 140 139 142 141 138  K 3.1* 3.6 3.5 3.5 4.0 3.4*  CL 121* 110 112* 111 111 106  CO2 18* 21* 20* 22 23 24   GLUCOSE 148* 124* 166* 145* 192* 110*  BUN 11 7 8 10 13 13   CREATININE 0.69 0.81 0.93 0.76 0.81 0.89  CALCIUM 7.3* 8.0* 8.2* 8.8* 8.6* 8.6*  AST  --  19 18 18 20 15   ALT  --  17 19 19 22 20   ALKPHOS  --  69 80 73 78  74  BILITOT  --  0.5 0.4 0.3 0.4 0.5  ALBUMIN  --  2.4* 2.4* 2.6* 2.6* 2.6*  MG 2.0 1.9 2.0 2.4 2.2 2.0  CRP  --  5.1* 3.3* 2.7* 1.4* 2.2*  PROCALCITON 0.50  --   --   --   --   --   BNP  --   --  112.8* 41.9 52.3 50.8    Scheduled Meds:  amitriptyline  50 mg Oral QHS   apixaban  10 mg Oral BID   Followed by   Derrill Memo ON 04/07/2022] apixaban  5 mg Oral BID   budesonide (PULMICORT) nebulizer solution  0.5 mg Nebulization BID   Chlorhexidine Gluconate Cloth  6 each Topical Daily   DULoxetine  30 mg Oral Daily   folic acid  2 mg Oral Daily   gabapentin  600 mg Oral TID   insulin aspart  0-9 Units Subcutaneous TID WC   ipratropium-albuterol  3 mL Nebulization BID   linaclotide  145 mcg Oral QAC breakfast   pantoprazole  40 mg Oral Daily   predniSONE  20 mg Oral Q breakfast   rosuvastatin  10 mg Oral QHS   sodium chloride flush  10-40 mL Intracatheter Q12H   sulfamethoxazole-trimethoprim  1 tablet Oral Once per day on  Mon Wed Fri   vitamin B-12  1,000 mcg Oral Daily   Continuous Infusions:  cefTRIAXone (ROCEPHIN)  IV     vancomycin 1,250 mg (04/02/22 1833)    Procedures/Studies: [place] EKG SITE RITE  Result Date: 03/31/2022 If Site Rite image not attached, placement could not be confirmed due to current cardiac rhythm.  VAS [place] LOWER EXTREMITY VENOUS (DVT)  Result Date: 03/30/2022  Lower Venous DVT Study Patient Name:  KYANDRA LOA  Date of Exam:   03/30/2022 Medical Rec #: [id number]    Accession #:    [id number] Date of Birth: [birth year]    Patient Gender: F Patient Age:   1 years Exam Location:  [facility] Procedure:      VAS [place] LOWER EXTREMITY VENOUS (DVT) Referring Phys: Deno Etienne [facility] --------------------------------------------------------------------------------  Indications: Swelling, pulmonary embolism, and Elevated D-dimer.  Performing Technologist: Bobetta Lime  Examination Guidelines: A complete evaluation includes B-mode imaging, spectral Doppler, color Doppler, and power Doppler as needed of all accessible portions of each vessel. Bilateral testing is considered an integral part of a complete examination. Limited examinations for reoccurring indications may be performed as noted. The reflux portion of the exam is performed with the patient in reverse Trendelenburg.  +---------+---------------+---------+-----------+----------+--------------+ RIGHT    CompressibilityPhasicitySpontaneityPropertiesThrombus Aging +---------+---------------+---------+-----------+----------+--------------+ CFV      Full           Yes      Yes                                 +---------+---------------+---------+-----------+----------+--------------+ SFJ      Full                                                        +---------+---------------+---------+-----------+----------+--------------+ FV Prox  Full                                                         +---------+---------------+---------+-----------+----------+--------------+  FV Mid   Full                                                        +---------+---------------+---------+-----------+----------+--------------+ FV DistalFull                                                        +---------+---------------+---------+-----------+----------+--------------+ PFV      Full                                                        +---------+---------------+---------+-----------+----------+--------------+ POP      Full           Yes      Yes                                 +---------+---------------+---------+-----------+----------+--------------+ PTV      Full                                                        +---------+---------------+---------+-----------+----------+--------------+ PERO     Full                                                        +---------+---------------+---------+-----------+----------+--------------+   +---------+---------------+---------+-----------+----------+--------------+ LEFT     CompressibilityPhasicitySpontaneityPropertiesThrombus Aging +---------+---------------+---------+-----------+----------+--------------+ CFV      Full           Yes      Yes                                 +---------+---------------+---------+-----------+----------+--------------+ SFJ      Full                                                        +---------+---------------+---------+-----------+----------+--------------+ FV Prox  Full                                                        +---------+---------------+---------+-----------+----------+--------------+ FV Mid   Full                                                        +---------+---------------+---------+-----------+----------+--------------+  FV DistalFull                                                         +---------+---------------+---------+-----------+----------+--------------+ PFV      Full                                                        +---------+---------------+---------+-----------+----------+--------------+ POP      Full           Yes      Yes                                 +---------+---------------+---------+-----------+----------+--------------+ PTV      Full                                                        +---------+---------------+---------+-----------+----------+--------------+ PERO     Full                                                        +---------+---------------+---------+-----------+----------+--------------+     Summary: RIGHT: - There is no evidence of deep vein thrombosis in the lower extremity.  - No cystic structure found in the popliteal fossa.  LEFT: - There is no evidence of deep vein thrombosis in the lower extremity.  - No cystic structure found in the popliteal fossa.  *See table(s) above for measurements and observations. Electronically signed by Monica Martinez MD on 03/30/2022 at 4:47:59 PM.    Final    DG CHEST PORT 1 VIEW  Result Date: 03/30/2022 CLINICAL DATA:  Status post right lung biopsy EXAM: PORTABLE CHEST 1 VIEW COMPARISON:  03/29/2022 FINDINGS: Unchanged AP portable chest radiograph with subtle right greater than left upper lobe heterogeneous airspace opacities and probable small, layering bilateral pleural effusions. No new airspace opacity. No pneumothorax. Mild cardiomegaly. The visualized skeletal structures are unremarkable. IMPRESSION: Unchanged AP portable chest radiograph with subtle right greater than left upper lobe heterogeneous airspace opacities and probable small, layering bilateral pleural effusions. No new airspace opacity. No pneumothorax following biopsy. Electronically Signed   By: Delanna Ahmadi M.D.   On: 03/30/2022 13:24   DG ESOPHAGUS W SINGLE CM (SOL OR THIN BA)  Result Date: 03/30/2022 CLINICAL  DATA:  Patient complaining of frequent globus sensation, regurgitation, and history of esophageal stricture. EXAM: ESOPHAGUS/BARIUM SWALLOW/TABLET STUDY TECHNIQUE: Single contrast examination was performed using thin liquid barium. This exam was performed by Gareth Eagle, PA-C, and was supervised and interpreted by Dr. Fabiola Backer. FLUOROSCOPY: Radiation Exposure Index (as provided by the fluoroscopic device): 25.8 mGy Kerma COMPARISON:  CT chest dated March 27, 2022. FINDINGS: Study was limited secondary to patient's mobility issues and inability to stand. Swallowing: No aspiration seen. Esophagus: Smooth focal fixed narrowing of the  distal esophagus just superior to the GE junction. No mucosal irregularity or defect. Esophageal motility: Mild dysmotility manifested by intermittent tertiary contractions. Hiatal Hernia: None. Gastroesophageal reflux: None visualized. Ingested 36mm barium tablet: Became stuck and did not pass beyond the gastroesophageal junction despite giving water and barium. Other: None. IMPRESSION: 1. Short-segment stricture of the distal esophagus just superior to the GE junction which did not allow passage of a barium tablet. This is likely reflux related, although no reflux was elicited on today's study. 2. Mild esophageal dysmotility. Electronically Signed   By: Titus Dubin M.D.   On: 03/30/2022 09:27   DG Chest Port 1 View  Result Date: 03/29/2022 CLINICAL DATA:  Shortness of breath and cough. EXAM: PORTABLE CHEST 1 VIEW COMPARISON:  03/26/2022 FINDINGS: Stable cardiomediastinal contours. Lungs are suboptimally inflated. No signs of pleural effusion or edema. Persistent asymmetric airspace opacification within the right upper lobe. IMPRESSION: Persistent asymmetric airspace opacification within the right upper lobe compatible with pneumonia. Electronically Signed   By: Kerby Moors M.D.   On: 03/29/2022 06:43   ECHOCARDIOGRAM COMPLETE  Result Date: 03/28/2022    ECHOCARDIOGRAM  REPORT   Patient Name:   Cherly Delbridge Date of Exam: 03/28/2022 Medical Rec #:  [id number]   Height:       64.0 in Accession #:    [id number]  Weight:       211.4 lb Date of Birth:  10/11/1966   BSA:          2.003 m Patient Age:    52 years    BP:           121/84 mmHg Patient Gender: F           HR:           86 bpm. Exam Location:  [facility] Procedure: 2D Echo, Cardiac Doppler and Color Doppler Indications:    Pulmonary embolus  History:        Patient has no prior history of Echocardiogram examinations.                 Risk Factors:Hypertension and Dyslipidemia.  Sonographer:    Wenda Low Referring Phys: [phone] DAVID TAT IMPRESSIONS  1. Left ventricular ejection fraction, by estimation, is 55%. The left ventricle has normal function. The left ventricle has no regional wall motion abnormalities. Left ventricular diastolic parameters were normal.  2. Right ventricular systolic function is normal. The right ventricular size is normal. There is normal pulmonary artery systolic pressure.  3. The pericardial effusion is anterior to the right ventricle.  4. The mitral valve is abnormal. Trivial mitral valve regurgitation. No evidence of mitral stenosis.  5. The aortic valve is tricuspid. There is mild calcification of the aortic valve. Aortic valve regurgitation is not visualized. Aortic valve sclerosis is present, with no evidence of aortic valve stenosis.  6. The inferior vena cava is normal in size with greater than 50% respiratory variability, suggesting right atrial pressure of 3 mmHg. FINDINGS  Left Ventricle: Left ventricular ejection fraction, by estimation, is 55%. The left ventricle has normal function. The left ventricle has no regional wall motion abnormalities. The left ventricular internal cavity size was normal in size. There is no left ventricular hypertrophy. Left ventricular diastolic parameters were normal. Right Ventricle: The right ventricular size is normal. No increase in right ventricular wall  thickness. Right ventricular systolic function is normal. There is normal pulmonary artery systolic pressure. The tricuspid regurgitant velocity is 1.76 m/s, and  with  an assumed right atrial pressure of 3 mmHg, the estimated right ventricular systolic pressure is 123XX123 mmHg. Left Atrium: Left atrial size was normal in size. Right Atrium: Right atrial size was normal in size. Pericardium: Trivial pericardial effusion is present. The pericardial effusion is anterior to the right ventricle. Mitral Valve: The mitral valve is abnormal. There is mild thickening of the mitral valve leaflet(s). There is mild calcification of the mitral valve leaflet(s). Trivial mitral valve regurgitation. No evidence of mitral valve stenosis. MV peak gradient, 6.5 mmHg. The mean mitral valve gradient is 3.0 mmHg. Tricuspid Valve: The tricuspid valve is normal in structure. Tricuspid valve regurgitation is trivial. No evidence of tricuspid stenosis. Aortic Valve: The aortic valve is tricuspid. There is mild calcification of the aortic valve. Aortic valve regurgitation is not visualized. Aortic valve sclerosis is present, with no evidence of aortic valve stenosis. Aortic valve mean gradient measures 5.0 mmHg. Aortic valve peak gradient measures 10.8 mmHg. Aortic valve area, by VTI measures 2.22 cm. Pulmonic Valve: The pulmonic valve was normal in structure. Pulmonic valve regurgitation is not visualized. No evidence of pulmonic stenosis. Aorta: The aortic root is normal in size and structure. Venous: The inferior vena cava is normal in size with greater than 50% respiratory variability, suggesting right atrial pressure of 3 mmHg. IAS/Shunts: No atrial level shunt detected by color flow Doppler.  LEFT VENTRICLE PLAX 2D LVIDd:         4.90 cm     Diastology LVIDs:         3.50 cm     LV e' medial:    7.72 cm/s LV PW:         1.00 cm     LV E/e' medial:  15.2 LV IVS:        0.90 cm     LV e' lateral:   14.60 cm/s LVOT diam:     2.00 cm     LV  E/e' lateral: 8.0 LV SV:         76 LV SV Index:   38 LVOT Area:     3.14 cm  LV Volumes (MOD) LV vol d, MOD A2C: 60.2 ml LV vol d, MOD A4C: 49.4 ml LV vol s, MOD A2C: 24.4 ml LV vol s, MOD A4C: 23.8 ml LV SV MOD A2C:     35.8 ml LV SV MOD A4C:     49.4 ml LV SV MOD BP:      30.8 ml RIGHT VENTRICLE RV Basal diam:  3.15 cm RV Mid diam:    2.80 cm RV S prime:     13.40 cm/s TAPSE (M-mode): 2.3 cm LEFT ATRIUM             Index        RIGHT ATRIUM           Index LA diam:        3.70 cm 1.85 cm/m   RA Area:     15.50 cm LA Vol (A2C):   46.0 ml 22.96 ml/m  RA Volume:   37.70 ml  18.82 ml/m LA Vol (A4C):   50.9 ml 25.41 ml/m LA Biplane Vol: 50.3 ml 25.11 ml/m  AORTIC VALVE                     PULMONIC VALVE AV Area (Vmax):    2.38 cm      PV Vmax:       0.90 m/s AV Area (Vmean):  2.30 cm      PV Peak grad:  3.2 mmHg AV Area (VTI):     2.22 cm AV Vmax:           164.00 cm/s AV Vmean:          107.000 cm/s AV VTI:            0.344 m AV Peak Grad:      10.8 mmHg AV Mean Grad:      5.0 mmHg LVOT Vmax:         124.00 cm/s LVOT Vmean:        78.500 cm/s LVOT VTI:          0.243 m LVOT/AV VTI ratio: 0.71  AORTA Ao Root diam: 2.90 cm MITRAL VALVE                TRICUSPID VALVE MV Area (PHT): 3.68 cm     TR Peak grad:   12.4 mmHg MV Area VTI:   2.13 cm     TR Vmax:        176.00 cm/s MV Peak grad:  6.5 mmHg MV Mean grad:  3.0 mmHg     SHUNTS MV Vmax:       1.27 m/s     Systemic VTI:  0.24 m MV Vmean:      77.6 cm/s    Systemic Diam: 2.00 cm MV Decel Time: 206 msec MV E velocity: 117.00 cm/s MV A velocity: 89.50 cm/s MV E/A ratio:  1.31 Jenkins Rouge MD Electronically signed by Jenkins Rouge MD Signature Date/Time: 03/28/2022/12:20:15 PM    Final    CT Angio Chest Pulmonary Embolism (PE) W or WO Contrast  Result Date: 03/27/2022 CLINICAL DATA:  Chest pain and shortness of breath for several weeks, recent pneumonia EXAM: CT ANGIOGRAPHY CHEST WITH CONTRAST TECHNIQUE: Multidetector CT imaging of the chest was performed  using the standard protocol during bolus administration of intravenous contrast. Multiplanar CT image reconstructions and MIPs were obtained to evaluate the vascular anatomy. RADIATION DOSE REDUCTION: This exam was performed according to the departmental dose-optimization program which includes automated exposure control, adjustment of the mA and/or kV according to patient size and/or use of iterative reconstruction technique. CONTRAST:  46mL OMNIPAQUE IOHEXOL 300 MG/ML  SOLN IV COMPARISON:  CT chest 01/04/2022 FINDINGS: Cardiovascular: Aorta normal caliber without aneurysm or dissection. Proximal great vessels unremarkable. Heart normal appearance. No pericardial effusion. Pulmonary arteries adequately opacified. Two small filling defects are identified consistent with pulmonary embolism, in RIGHT lower lobe image 214 and in RIGHT middle lobe image 160. No additional emboli. Mediastinum/Nodes: Esophagus unremarkable. Few normal sized mediastinal lymph nodes. No thoracic adenopathy. Base of cervical region normal appearance. Lungs/Pleura: Small BILATERAL pleural effusions. BILATERAL pulmonary infiltrates, greatest in RIGHT upper lobe. No pneumothorax or mass. Upper Abdomen: Gallbladder surgically absent. Splenic deformity question related to prior infarct or surgery. Musculoskeletal: No acute osseous findings. Review of the MIP images confirms the above findings. IMPRESSION: Small pulmonary emboli in RIGHT lung. Small BILATERAL pleural effusions. Patchy BILATERAL pulmonary infiltrates consistent with multifocal pneumonia greatest in RIGHT upper lobe. Critical Value/emergent results were called by telephone at the time of interpretation on 03/27/2022 at 1510 hrs to provider DAVID TAT MD, who verbally acknowledged these results. Electronically Signed   By: Lavonia Dana M.D.   On: 03/27/2022 15:13   CT Head Wo Contrast  Result Date: 03/26/2022 CLINICAL DATA:  Altered mental status.  Recent stroke. EXAM: CT HEAD WITHOUT  CONTRAST TECHNIQUE: Contiguous  axial images were obtained from the base of the skull through the vertex without intravenous contrast. RADIATION DOSE REDUCTION: This exam was performed according to the departmental dose-optimization program which includes automated exposure control, adjustment of the mA and/or kV according to patient size and/or use of iterative reconstruction technique. COMPARISON:  11/01/2020 FINDINGS: Brain: No evidence of intracranial hemorrhage, acute infarction, hydrocephalus, extra-axial collection, or mass lesion/mass effect. 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 Vascular:  No hyperdense vessel or other acute findings. Skull: No evidence of fracture or other significant bone abnormality. Sinuses/Orbits:  No acute findings. Other: None. IMPRESSION: No acute intracranial abnormality. Electronically Signed   By: Marlaine Hind M.D.   On: 03/26/2022 11:42   DG Chest Port 1 View  Result Date: 03/26/2022 CLINICAL DATA:  Sepsis. EXAM: PORTABLE CHEST 1 VIEW COMPARISON:  February 19, 2022 FINDINGS: There is infiltrate in the right mid lung. There may be infiltrate in the medial left upper lung. No pneumothorax. No other acute interval changes. The study is limited however due to portable technique. IMPRESSION: The study is limited due to low volume portable technique. However, there is an infiltrate in the right mid lung consistent with for pneumonia given history. There may be a more subtle smaller infiltrate in the medial left upper lung. Electronically Signed   By: Dorise Bullion III M.D.   On: 03/26/2022 10:22     LOS: 8 days   Signature  Lala Lund M.D on 04/03/2022 at 8:12 AM   -  To page go to [email or web address]

## 2022-04-04 DIAGNOSIS — A419 Sepsis, unspecified organism: Secondary | ICD-10-CM | POA: Diagnosis not present

## 2022-04-04 DIAGNOSIS — R6520 Severe sepsis without septic shock: Secondary | ICD-10-CM | POA: Diagnosis not present

## 2022-04-04 DIAGNOSIS — Z8701 Personal history of pneumonia (recurrent): Secondary | ICD-10-CM | POA: Diagnosis not present

## 2022-04-04 DIAGNOSIS — M313 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: Secondary | ICD-10-CM | POA: Diagnosis not present

## 2022-04-04 LAB — CBC WITH DIFFERENTIAL/PLATELET
Abs Immature Granulocytes: 0.27 10*3/uL — ABNORMAL HIGH (ref 0.00–0.07)
Basophils Absolute: 0 10*3/uL (ref 0.0–0.1)
Basophils Relative: 1 %
Eosinophils Absolute: 0.1 10*3/uL (ref 0.0–0.5)
Eosinophils Relative: 1 %
HCT: 35.4 % — ABNORMAL LOW (ref 36.0–46.0)
Hemoglobin: 10.8 g/dL — ABNORMAL LOW (ref 12.0–15.0)
Immature Granulocytes: 3 %
Lymphocytes Relative: 25 %
Lymphs Abs: 2.2 10*3/uL (ref 0.7–4.0)
MCH: 26 pg (ref 26.0–34.0)
MCHC: 30.5 g/dL (ref 30.0–36.0)
MCV: 85.3 fL (ref 80.0–100.0)
Monocytes Absolute: 0.4 10*3/uL (ref 0.1–1.0)
Monocytes Relative: 4 %
Neutro Abs: 5.7 10*3/uL (ref 1.7–7.7)
Neutrophils Relative %: 66 %
Platelets: 302 10*3/uL (ref 150–400)
RBC: 4.15 MIL/uL (ref 3.87–5.11)
RDW: 22.8 % — ABNORMAL HIGH (ref 11.5–15.5)
WBC: 8.7 10*3/uL (ref 4.0–10.5)
nRBC: 0 % (ref 0.0–0.2)

## 2022-04-04 LAB — URINE CULTURE: Culture: NO GROWTH

## 2022-04-04 LAB — COMPREHENSIVE METABOLIC PANEL
ALT: 33 U/L (ref 0–44)
AST: 27 U/L (ref 15–41)
Albumin: 2.7 g/dL — ABNORMAL LOW (ref 3.5–5.0)
Alkaline Phosphatase: 70 U/L (ref 38–126)
Anion gap: 8 (ref 5–15)
BUN: 18 mg/dL (ref 6–20)
CO2: 22 mmol/L (ref 22–32)
Calcium: 8.9 mg/dL (ref 8.9–10.3)
Chloride: 110 mmol/L (ref 98–111)
Creatinine, Ser: 0.82 mg/dL (ref 0.44–1.00)
GFR, Estimated: >60 mL/min (ref >60)
Glucose, Bld: 129 mg/dL — ABNORMAL HIGH (ref 70–99)
Potassium: 3.9 mmol/L (ref 3.5–5.1)
Sodium: 140 mmol/L (ref 135–145)
Total Bilirubin: 0.5 mg/dL (ref 0.3–1.2)
Total Protein: 5.7 g/dL — ABNORMAL LOW (ref 6.5–8.1)

## 2022-04-04 LAB — PNEUMOCYSTIS JIROVECI SMEAR BY DFA

## 2022-04-04 LAB — ACID FAST SMEAR (AFB, MYCOBACTERIA): Acid Fast Smear: NEGATIVE

## 2022-04-04 LAB — GLUCOSE, CAPILLARY
Glucose-Capillary: 113 mg/dL — ABNORMAL HIGH (ref 70–99)
Glucose-Capillary: 359 mg/dL — ABNORMAL HIGH (ref 70–99)
Glucose-Capillary: 171 mg/dL — ABNORMAL HIGH (ref 70–99)
Glucose-Capillary: 215 mg/dL — ABNORMAL HIGH (ref 70–99)

## 2022-04-04 LAB — MAGNESIUM: Magnesium: 2.1 mg/dL (ref 1.7–2.4)

## 2022-04-04 NOTE — Progress Notes (Signed)
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  Diagnosed with sepsis due to pneumonia and transferred to [facility] for further care.   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  Monitor final cultures, so far appears that she is colonized with Candida, Fungitell assay is negative, case discussed with ID and so far is to discharge her with PICC line and IV Vancomycin, OPAT done by ID, since she developed fever and dysuria on 04/03/2022 repeat cultures also pending along with urine cultures, if they became positive antibiotic regimen might need to be titrated.  Post discharge follow-up with ID and pulmonary.  Speech therapy following, there could also be element of chronic microaspiration due to oversedation from her home medications, speech therapy on board.  Patient counseled along with husband on avoiding over sedation.     New onset of intense dysuria with high fevers on 04/03/2022.  Blood cultures, UA with culture repeated, she recently had pan resistant E. coli hence started on cefepime in addition to vancomycin on 04/03/2022, dysuria and fevers have improved, so far urine culture negative, monitor final blood cultures.  If they  remain negative likely discharge in the next 1 to 2 days on IV vancomycin for #1 above   Acute pulmonary embolus ([place]) - 6/5 CTA chest--Small pulmonary emboli in RIGHT lung, stable echocardiogram with EF 55% and no RV strain.  Now placed on oral Eliquis unremarkable lower extremity venous duplex.  Granulomatosis with polyangiitis [facility]) - Patient follows with rheumatology at [facility], Dr. Allean Found, She is on methotrexate every Sunday, Rituximab currently on hold secondary to recent sepsis. She is chronically on prednisone 5 mg daily currently on 20 mg daily.  Monitor closely .  Chronic iron deficiency anemia.  Check anemia panel, PPI, outpatient age-appropriate work-up by PCP.  Monitor. Agreeable for transfusion if needed.  Toxic and metabolic encephalopathy.  Due to combination of #1 above and high-dose narcotics and multiple sedatives being on board.  She has been counseled not to overuse narcotics as risk of respiratory failure and intubation is high, minimize offending medications, as needed Narcan, head CT is negative and she has no focal deficits.  Mentation much improved on 03/30/2022 with adjusted medications.  Opioid dependence ([place])  - Patient has intrathecal pump with bupivacaine and ziconotide, Last refill 03/02/2022, Patient receives Nucynta 75 mg, #120; last refill 03/17/2022,  Patient is also on oxycodone 10 mg; #30, last refilled 02/23/2022.   Chemotherapy-induced peripheral neuropathy ([place]) - Patient is on gabapentin, Cymbalta, amitriptyline topiramate, trazodone, medications adjusted to reduce encephalopathy and sedation.  Presence of intrathecal pump - Patient has intrathecal pump with bupivacaine and ziconotide, Last refill 03/02/2022.  Stage 3a chronic kidney disease (HCC) Baseline creatinine 0.8-1.1, Monitor with serial BMP.  UTI (urinary tract infection) - UA > 50 WBC,  culture  E.coli, continue merrem on 03/29/2022 then per ID.  Class 2 obesity- BMI 35.04, with PCP for weight  loss    Family Communication:     Husband Antony Haste [phone] on 03/29/2022 at 11:11 AM, 03/30/2022, 03/31/2022  Consultants:  Pulmonary, ID  Code Status:  FULL   DVT Prophylaxis:  IV Heparin    Procedures:   Right arm PICC line placed 03/31/2022.    Bronchoscopy 03/30/2022.    Leg [place] - no DVT.   CTA - Small pulmonary emboli in RIGHT lung. Small BILATERAL pleural effusions. Patchy BILATERAL pulmonary infiltrates consistent with multifocal pneumonia greatest in RIGHT upper lobe.  TTE - 1. Left ventricular ejection fraction, by estimation, is 55%. The left ventricle has normal function. The left ventricle has no regional wall motion abnormalities. Left ventricular diastolic parameters were normal.  2. Right ventricular systolic function is normal. The right ventricular size is normal. There is normal pulmonary artery systolic pressure.  3. The pericardial effusion is anterior to the right ventricle.  4. The mitral valve is abnormal. Trivial mitral valve regurgitation. No evidence of mitral stenosis.  5. The aortic valve is tricuspid. There is mild calcification of the aortic valve. Aortic valve regurgitation is not visualized. Aortic valve sclerosis is present, with no evidence of aortic valve stenosis.  6. The inferior vena cava is normal in size with greater than 50% respiratory variability, suggesting right atrial pressure of 3 mmHg.   Subjective: Patient in bed, appears comfortable, denies any headache, no fever, no chest pain or pressure, no shortness of breath , no abdominal pain. No new focal weakness.  Dysuria has improved as compared to yesterday.    Objective: Vitals:   04/03/22 2335 04/04/22 0431 04/04/22 0756 04/04/22 0838  BP: 114/78 116/70 109/73   Pulse: 86 93 89   Resp: 14 16 18    Temp: 97.8 F (36.6 C) 98.3 F (36.8 C) 99 F (37.2 C)   TempSrc: Oral Oral Oral   SpO2: 96% 93% 96% 97%  Weight:      Height:        Intake/Output Summary (Last 24 hours) at 04/04/2022  1205 Last data filed at 04/04/2022 0437 Gross per 24 hour  Intake 1200.07 ml  Output 1550 ml  Net -349.93 ml   Weight change:   Exam:  Awake Alert, No new F.N deficits, Normal affect [place].AT,PERRAL Supple Neck, No JVD,   Symmetrical Chest wall movement, Good air movement bilaterally, CTAB RRR,No Gallops, Rubs or new Murmurs,  +ve B.Sounds, Abd Soft, No tenderness,   Bilateral transmetatarsal amputations, right arm PICC line in place   Data Reviewed:  Recent Labs  Lab 03/30/22 0040 03/31/22 0751 04/01/22 0345 04/02/22 0415 04/03/22 0910 04/04/22 0430  WBC 9.7 6.3 7.7 9.0 10.1 8.7  HGB 9.0* 9.7* 9.8* 10.3* 11.4* 10.8*  HCT 30.9* 33.4* 32.4* 33.5* 38.6 35.4*  PLT 287 310 314 307 320 302  MCV 87.5 86.8 86.4 84.6 84.8 85.3  MCH 25.5* 25.2* 26.1 26.0 25.1* 26.0  MCHC 29.1* 29.0* 30.2 30.7 29.5* 30.5  RDW 22.5* 22.6* 22.9* 22.5* 23.1* 22.8*  LYMPHSABS 1.4 1.6 1.9 2.6  --  2.2  MONOABS 0.5 0.4 0.2 0.0*  --  0.4  EOSABS 0.0 0.0 0.0 0.0  --  0.1  BASOSABS 0.0 0.1 0.0 0.0  --  0.0    Recent Labs  Lab 03/29/22 0622 03/30/22 0040 03/31/22 0751 04/01/22 0345 04/02/22 0415 04/04/22 0430  NA 140 139 142 141 138 140  K 3.6 3.5 3.5 4.0 3.4* 3.9  CL 110 112* 111 111 106 110  CO2 21* 20* 22 23 24 22   GLUCOSE 124* 166* 145* 192* 110* 129*  BUN 7 8 10 13 13 18   CREATININE 0.81 0.93 0.76 0.81 0.89 0.82  CALCIUM 8.0* 8.2* 8.8* 8.6* 8.6* 8.9  AST 19 18 18 20 15 27   ALT 17 19 19 22 20  33  ALKPHOS 69 80 73 78 74 70  BILITOT 0.5 0.4 0.3 0.4 0.5 0.5  ALBUMIN 2.4* 2.4* 2.6* 2.6* 2.6* 2.7*  MG 1.9 2.0 2.4 2.2 2.0 2.1  CRP 5.1* 3.3* 2.7* 1.4* 2.2*  --   BNP  --  112.8* 41.9 52.3 50.8  --     Scheduled Meds:  amitriptyline  50 mg Oral QHS   apixaban  10 mg Oral BID   Followed by   Derrill Memo ON 04/07/2022] apixaban  5 mg Oral BID   budesonide (PULMICORT) nebulizer solution  0.5 mg Nebulization BID   Chlorhexidine Gluconate Cloth  6 each Topical Daily   DULoxetine  30 mg Oral Daily    folic acid  2 mg Oral Daily   gabapentin  600 mg Oral TID   insulin aspart  0-9 Units Subcutaneous TID WC   ipratropium-albuterol  3 mL Nebulization BID   linaclotide  145 mcg Oral QAC breakfast   pantoprazole  40 mg Oral Daily   predniSONE  20 mg Oral Q breakfast   rosuvastatin  10 mg Oral QHS   sodium chloride flush  10-40 mL Intracatheter Q12H   sulfamethoxazole-trimethoprim  1 tablet Oral Once per day on Mon Wed Fri   vitamin B-12  1,000 mcg Oral Daily   Continuous Infusions:  ceFEPime (MAXIPIME) IV 2 g (04/04/22 0804)   vancomycin 1,250 mg (04/03/22 2024)    Procedures/Studies: DG Foot 2 Views Right  Result Date: 04/03/2022 CLINICAL DATA:  Right foot ulcer EXAM: RIGHT FOOT - 2 VIEW COMPARISON:  None Available. FINDINGS: No acute fracture or dislocation. Prior transmetatarsal amputation. Small plantar calcaneal spur. Enthesopathic changes of the Achilles tendon insertion. No aggressive osseous lesion. Normal alignment. Soft tissue are unremarkable. No radiopaque foreign body or soft tissue emphysema. IMPRESSION: 1. Prior transmetatarsal amputation. 2. No evidence of osteomyelitis of the right foot. Electronically Signed   By: Kathreen Devoid M.D.   On: 04/03/2022 15:45   DG Chest Port 1 View  Result Date: 04/03/2022 CLINICAL DATA:  Severe sepsis, shortness of breath EXAM: PORTABLE CHEST 1 VIEW COMPARISON:  Radiograph 03/30/2022 FINDINGS: Note that the exam is mislabeled right versus left. There is a right upper extremity PICC with tip overlying the distal superior vena cava. Unchanged cardiomediastinal silhouette. Interval improvement in bilateral airspace opacities with faint residual opacity seen in the right lung. No large effusion. No pneumothorax. No acute osseous abnormality. IMPRESSION: Interval improvement in bilateral airspace disease, with mild residual opacities in the right lung. Electronically Signed   By: Maurine Simmering M.D.   On: 04/03/2022 08:36   [place] EKG SITE RITE  Result  Date: 03/31/2022 If Site Rite image not attached, placement could not be confirmed due to current cardiac rhythm.  VAS [place] LOWER EXTREMITY VENOUS (DVT)  Result Date: 03/30/2022  Lower Venous DVT Study Patient Name:  SHAYMA SCHWIESOW  Date of Exam:   03/30/2022 Medical Rec #: [id number]    Accession #:    [id number] Date of Birth: 10/29/1965    Patient Gender: F Patient Age:   61 years Exam  Location:  [facility] Procedure:      VAS [place] LOWER EXTREMITY VENOUS (DVT) Referring Phys: Deno Etienne [facility] --------------------------------------------------------------------------------  Indications: Swelling, pulmonary embolism, and Elevated D-dimer.  Performing Technologist: Bobetta Lime  Examination Guidelines: A complete evaluation includes B-mode imaging, spectral Doppler, color Doppler, and power Doppler as needed of all accessible portions of each vessel. Bilateral testing is considered an integral part of a complete examination. Limited examinations for reoccurring indications may be performed as noted. The reflux portion of the exam is performed with the patient in reverse Trendelenburg.  +---------+---------------+---------+-----------+----------+--------------+ RIGHT    CompressibilityPhasicitySpontaneityPropertiesThrombus Aging +---------+---------------+---------+-----------+----------+--------------+ CFV      Full           Yes      Yes                                 +---------+---------------+---------+-----------+----------+--------------+ SFJ      Full                                                        +---------+---------------+---------+-----------+----------+--------------+ FV Prox  Full                                                        +---------+---------------+---------+-----------+----------+--------------+ FV Mid   Full                                                        +---------+---------------+---------+-----------+----------+--------------+ FV  DistalFull                                                        +---------+---------------+---------+-----------+----------+--------------+ PFV      Full                                                        +---------+---------------+---------+-----------+----------+--------------+ POP      Full           Yes      Yes                                 +---------+---------------+---------+-----------+----------+--------------+ PTV      Full                                                        +---------+---------------+---------+-----------+----------+--------------+ PERO     Full                                                        +---------+---------------+---------+-----------+----------+--------------+   +---------+---------------+---------+-----------+----------+--------------+  LEFT     CompressibilityPhasicitySpontaneityPropertiesThrombus Aging +---------+---------------+---------+-----------+----------+--------------+ CFV      Full           Yes      Yes                                 +---------+---------------+---------+-----------+----------+--------------+ SFJ      Full                                                        +---------+---------------+---------+-----------+----------+--------------+ FV Prox  Full                                                        +---------+---------------+---------+-----------+----------+--------------+ FV Mid   Full                                                        +---------+---------------+---------+-----------+----------+--------------+ FV DistalFull                                                        +---------+---------------+---------+-----------+----------+--------------+ PFV      Full                                                        +---------+---------------+---------+-----------+----------+--------------+ POP      Full           Yes      Yes                                  +---------+---------------+---------+-----------+----------+--------------+ PTV      Full                                                        +---------+---------------+---------+-----------+----------+--------------+ PERO     Full                                                        +---------+---------------+---------+-----------+----------+--------------+     Summary: RIGHT: - There is no evidence of deep vein thrombosis in the lower extremity.  - No cystic structure found in the popliteal fossa.  LEFT: - There is no evidence of deep vein thrombosis in the lower extremity.  - No  cystic structure found in the popliteal fossa.  *See table(s) above for measurements and observations. Electronically signed by Monica Martinez MD on 03/30/2022 at 4:47:59 PM.    Final    DG CHEST PORT 1 VIEW  Result Date: 03/30/2022 CLINICAL DATA:  Status post right lung biopsy EXAM: PORTABLE CHEST 1 VIEW COMPARISON:  03/29/2022 FINDINGS: Unchanged AP portable chest radiograph with subtle right greater than left upper lobe heterogeneous airspace opacities and probable small, layering bilateral pleural effusions. No new airspace opacity. No pneumothorax. Mild cardiomegaly. The visualized skeletal structures are unremarkable. IMPRESSION: Unchanged AP portable chest radiograph with subtle right greater than left upper lobe heterogeneous airspace opacities and probable small, layering bilateral pleural effusions. No new airspace opacity. No pneumothorax following biopsy. Electronically Signed   By: Delanna Ahmadi M.D.   On: 03/30/2022 13:24   DG ESOPHAGUS W SINGLE CM (SOL OR THIN BA)  Result Date: 03/30/2022 CLINICAL DATA:  Patient complaining of frequent globus sensation, regurgitation, and history of esophageal stricture. EXAM: ESOPHAGUS/BARIUM SWALLOW/TABLET STUDY TECHNIQUE: Single contrast examination was performed using thin liquid barium. This exam was performed by Gareth Eagle, PA-C, and  was supervised and interpreted by Dr. Fabiola Backer. FLUOROSCOPY: Radiation Exposure Index (as provided by the fluoroscopic device): 25.8 mGy Kerma COMPARISON:  CT chest dated March 27, 2022. FINDINGS: Study was limited secondary to patient's mobility issues and inability to stand. Swallowing: No aspiration seen. Esophagus: Smooth focal fixed narrowing of the distal esophagus just superior to the GE junction. No mucosal irregularity or defect. Esophageal motility: Mild dysmotility manifested by intermittent tertiary contractions. Hiatal Hernia: None. Gastroesophageal reflux: None visualized. Ingested 42mm barium tablet: Became stuck and did not pass beyond the gastroesophageal junction despite giving water and barium. Other: None. IMPRESSION: 1. Short-segment stricture of the distal esophagus just superior to the GE junction which did not allow passage of a barium tablet. This is likely reflux related, although no reflux was elicited on today's study. 2. Mild esophageal dysmotility. Electronically Signed   By: Titus Dubin M.D.   On: 03/30/2022 09:27   DG Chest Port 1 View  Result Date: 03/29/2022 CLINICAL DATA:  Shortness of breath and cough. EXAM: PORTABLE CHEST 1 VIEW COMPARISON:  03/26/2022 FINDINGS: Stable cardiomediastinal contours. Lungs are suboptimally inflated. No signs of pleural effusion or edema. Persistent asymmetric airspace opacification within the right upper lobe. IMPRESSION: Persistent asymmetric airspace opacification within the right upper lobe compatible with pneumonia. Electronically Signed   By: Kerby Moors M.D.   On: 03/29/2022 06:43   ECHOCARDIOGRAM COMPLETE  Result Date: 03/28/2022    ECHOCARDIOGRAM REPORT   Patient Name:   [name] Date of Exam: 03/28/2022 Medical Rec #:  [id number]   Height:       64.0 in Accession #:    [id number]  Weight:       211.4 lb Date of Birth:  1966/02/17   BSA:          2.003 m Patient Age:    75 years    BP:           121/84 mmHg Patient Gender: F            HR:           86 bpm. Exam Location:  [facility] Procedure: 2D Echo, Cardiac Doppler and Color Doppler Indications:    Pulmonary embolus  History:        Patient has no prior history of Echocardiogram examinations.  Risk Factors:Hypertension and Dyslipidemia.  Sonographer:    Wenda Low Referring Phys: [phone] DAVID TAT IMPRESSIONS  1. Left ventricular ejection fraction, by estimation, is 55%. The left ventricle has normal function. The left ventricle has no regional wall motion abnormalities. Left ventricular diastolic parameters were normal.  2. Right ventricular systolic function is normal. The right ventricular size is normal. There is normal pulmonary artery systolic pressure.  3. The pericardial effusion is anterior to the right ventricle.  4. The mitral valve is abnormal. Trivial mitral valve regurgitation. No evidence of mitral stenosis.  5. The aortic valve is tricuspid. There is mild calcification of the aortic valve. Aortic valve regurgitation is not visualized. Aortic valve sclerosis is present, with no evidence of aortic valve stenosis.  6. The inferior vena cava is normal in size with greater than 50% respiratory variability, suggesting right atrial pressure of 3 mmHg. FINDINGS  Left Ventricle: Left ventricular ejection fraction, by estimation, is 55%. The left ventricle has normal function. The left ventricle has no regional wall motion abnormalities. The left ventricular internal cavity size was normal in size. There is no left ventricular hypertrophy. Left ventricular diastolic parameters were normal. Right Ventricle: The right ventricular size is normal. No increase in right ventricular wall thickness. Right ventricular systolic function is normal. There is normal pulmonary artery systolic pressure. The tricuspid regurgitant velocity is 1.76 m/s, and  with an assumed right atrial pressure of 3 mmHg, the estimated right ventricular systolic pressure is 123XX123 mmHg. Left  Atrium: Left atrial size was normal in size. Right Atrium: Right atrial size was normal in size. Pericardium: Trivial pericardial effusion is present. The pericardial effusion is anterior to the right ventricle. Mitral Valve: The mitral valve is abnormal. There is mild thickening of the mitral valve leaflet(s). There is mild calcification of the mitral valve leaflet(s). Trivial mitral valve regurgitation. No evidence of mitral valve stenosis. MV peak gradient, 6.5 mmHg. The mean mitral valve gradient is 3.0 mmHg. Tricuspid Valve: The tricuspid valve is normal in structure. Tricuspid valve regurgitation is trivial. No evidence of tricuspid stenosis. Aortic Valve: The aortic valve is tricuspid. There is mild calcification of the aortic valve. Aortic valve regurgitation is not visualized. Aortic valve sclerosis is present, with no evidence of aortic valve stenosis. Aortic valve mean gradient measures 5.0 mmHg. Aortic valve peak gradient measures 10.8 mmHg. Aortic valve area, by VTI measures 2.22 cm. Pulmonic Valve: The pulmonic valve was normal in structure. Pulmonic valve regurgitation is not visualized. No evidence of pulmonic stenosis. Aorta: The aortic root is normal in size and structure. Venous: The inferior vena cava is normal in size with greater than 50% respiratory variability, suggesting right atrial pressure of 3 mmHg. IAS/Shunts: No atrial level shunt detected by color flow Doppler.  LEFT VENTRICLE PLAX 2D LVIDd:         4.90 cm     Diastology LVIDs:         3.50 cm     LV e' medial:    7.72 cm/s LV PW:         1.00 cm     LV E/e' medial:  15.2 LV IVS:        0.90 cm     LV e' lateral:   14.60 cm/s LVOT diam:     2.00 cm     LV E/e' lateral: 8.0 LV SV:         76 LV SV Index:   38 LVOT Area:  3.14 cm  LV Volumes (MOD) LV vol d, MOD A2C: 60.2 ml LV vol d, MOD A4C: 49.4 ml LV vol s, MOD A2C: 24.4 ml LV vol s, MOD A4C: 23.8 ml LV SV MOD A2C:     35.8 ml LV SV MOD A4C:     49.4 ml LV SV MOD BP:      30.8  ml RIGHT VENTRICLE RV Basal diam:  3.15 cm RV Mid diam:    2.80 cm RV S prime:     13.40 cm/s TAPSE (M-mode): 2.3 cm LEFT ATRIUM             Index        RIGHT ATRIUM           Index LA diam:        3.70 cm 1.85 cm/m   RA Area:     15.50 cm LA Vol (A2C):   46.0 ml 22.96 ml/m  RA Volume:   37.70 ml  18.82 ml/m LA Vol (A4C):   50.9 ml 25.41 ml/m LA Biplane Vol: 50.3 ml 25.11 ml/m  AORTIC VALVE                     PULMONIC VALVE AV Area (Vmax):    2.38 cm      PV Vmax:       0.90 m/s AV Area (Vmean):   2.30 cm      PV Peak grad:  3.2 mmHg AV Area (VTI):     2.22 cm AV Vmax:           164.00 cm/s AV Vmean:          107.000 cm/s AV VTI:            0.344 m AV Peak Grad:      10.8 mmHg AV Mean Grad:      5.0 mmHg LVOT Vmax:         124.00 cm/s LVOT Vmean:        78.500 cm/s LVOT VTI:          0.243 m LVOT/AV VTI ratio: 0.71  AORTA Ao Root diam: 2.90 cm MITRAL VALVE                TRICUSPID VALVE MV Area (PHT): 3.68 cm     TR Peak grad:   12.4 mmHg MV Area VTI:   2.13 cm     TR Vmax:        176.00 cm/s MV Peak grad:  6.5 mmHg MV Mean grad:  3.0 mmHg     SHUNTS MV Vmax:       1.27 m/s     Systemic VTI:  0.24 m MV Vmean:      77.6 cm/s    Systemic Diam: 2.00 cm MV Decel Time: 206 msec MV E velocity: 117.00 cm/s MV A velocity: 89.50 cm/s MV E/A ratio:  1.31 Jenkins Rouge MD Electronically signed by Jenkins Rouge MD Signature Date/Time: 03/28/2022/12:20:15 PM    Final    CT Angio Chest Pulmonary Embolism (PE) W or WO Contrast  Result Date: 03/27/2022 CLINICAL DATA:  Chest pain and shortness of breath for several weeks, recent pneumonia EXAM: CT ANGIOGRAPHY CHEST WITH CONTRAST TECHNIQUE: Multidetector CT imaging of the chest was performed using the standard protocol during bolus administration of intravenous contrast. Multiplanar CT image reconstructions and MIPs were obtained to evaluate the vascular anatomy. RADIATION DOSE REDUCTION: This exam was performed according to the departmental dose-optimization program  which includes automated exposure control, adjustment of the mA and/or kV according to patient size and/or use of iterative reconstruction technique. CONTRAST:  49mL OMNIPAQUE IOHEXOL 300 MG/ML  SOLN IV COMPARISON:  CT chest 01/04/2022 FINDINGS: Cardiovascular: Aorta normal caliber without aneurysm or dissection. Proximal great vessels unremarkable. Heart normal appearance. No pericardial effusion. Pulmonary arteries adequately opacified. Two small filling defects are identified consistent with pulmonary embolism, in RIGHT lower lobe image 214 and in RIGHT middle lobe image 160. No additional emboli. Mediastinum/Nodes: Esophagus unremarkable. Few normal sized mediastinal lymph nodes. No thoracic adenopathy. Base of cervical region normal appearance. Lungs/Pleura: Small BILATERAL pleural effusions. BILATERAL pulmonary infiltrates, greatest in RIGHT upper lobe. No pneumothorax or mass. Upper Abdomen: Gallbladder surgically absent. Splenic deformity question related to prior infarct or surgery. Musculoskeletal: No acute osseous findings. Review of the MIP images confirms the above findings. IMPRESSION: Small pulmonary emboli in RIGHT lung. Small BILATERAL pleural effusions. Patchy BILATERAL pulmonary infiltrates consistent with multifocal pneumonia greatest in RIGHT upper lobe. Critical Value/emergent results were called by telephone at the time of interpretation on 03/27/2022 at 1510 hrs to provider DAVID TAT MD, who verbally acknowledged these results. Electronically Signed   By: Lavonia Dana M.D.   On: 03/27/2022 15:13   CT Head Wo Contrast  Result Date: 03/26/2022 CLINICAL DATA:  Altered mental status.  Recent stroke. EXAM: CT HEAD WITHOUT CONTRAST TECHNIQUE: Contiguous axial images were obtained from the base of the skull through the vertex without intravenous contrast. RADIATION DOSE REDUCTION: This exam was performed according to the departmental dose-optimization program which includes automated exposure  control, adjustment of the mA and/or kV according to patient size and/or use of iterative reconstruction technique. COMPARISON:  11/01/2020 FINDINGS: Brain: No evidence of intracranial hemorrhage, acute infarction, hydrocephalus, extra-axial collection, or mass lesion/mass effect. 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 Vascular:  No hyperdense vessel or other acute findings. Skull: No evidence of fracture or other significant bone abnormality. Sinuses/Orbits:  No acute findings. Other: None. IMPRESSION: No acute intracranial abnormality. Electronically Signed   By: Marlaine Hind M.D.   On: 03/26/2022 11:42   DG Chest Port 1 View  Result Date: 03/26/2022 CLINICAL DATA:  Sepsis. EXAM: PORTABLE CHEST 1 VIEW COMPARISON:  February 19, 2022 FINDINGS: There is infiltrate in the right mid lung. There may be infiltrate in the medial left upper lung. No pneumothorax. No other acute interval changes. The study is limited however due to portable technique. IMPRESSION: The study is limited due to low volume portable technique. However, there is an infiltrate in the right mid lung consistent with for pneumonia given history. There may be a more subtle smaller infiltrate in the medial left upper lung. Electronically Signed   By: Dorise Bullion III M.D.   On: 03/26/2022 10:22     LOS: 9 days   Signature  Lala Lund M.D on 04/04/2022 at 12:05 PM   -  To page go to [email or web address]

## 2022-04-04 NOTE — Progress Notes (Signed)
[facility] for Infectious Disease    Date of Admission:  03/26/2022   Total days of antibiotics 10           ID: [name] is a 56 y.o. female with  wegener's with fuo, initially consistent with pneumonia and most recent concern for uti Principal Problem:   Severe sepsis ([place]) Active Problems:   Presence of intrathecal pump   Chemotherapy-induced peripheral neuropathy (HCC)   Secondary adrenal insufficiency (HCC)   Stage 3a chronic kidney disease ([place])   Lobar pneumonia ([place])   Granulomatosis with polyangiitis (HCC)   Class 2 obesity   UTI (urinary tract infection)   Opioid dependence ([place])   Acute metabolic encephalopathy   Acute pulmonary embolus (HCC)    Subjective: Afebrile x 24hr, dysuria improved    Medications:   amitriptyline  50 mg Oral QHS   apixaban  10 mg Oral BID   Followed by   Derrill Memo ON 04/07/2022] apixaban  5 mg Oral BID   budesonide (PULMICORT) nebulizer solution  0.5 mg Nebulization BID   Chlorhexidine Gluconate Cloth  6 each Topical Daily   DULoxetine  30 mg Oral Daily   folic acid  2 mg Oral Daily   gabapentin  600 mg Oral TID   insulin aspart  0-9 Units Subcutaneous TID WC   ipratropium-albuterol  3 mL Nebulization BID   linaclotide  145 mcg Oral QAC breakfast   pantoprazole  40 mg Oral Daily   predniSONE  20 mg Oral Q breakfast   rosuvastatin  10 mg Oral QHS   sodium chloride flush  10-40 mL Intracatheter Q12H   sulfamethoxazole-trimethoprim  1 tablet Oral Once per day on Mon Wed Fri   vitamin B-12  1,000 mcg Oral Daily    Objective: Vital signs in last 24 hours: Temp:  [97.8 F (36.6 C)-99.3 F (37.4 C)] 98.4 F (36.9 C) (06/13 1609) Pulse Rate:  [83-100] 90 (06/13 1609) Resp:  [14-18] 16 (06/13 1609) BP: (106-117)/(70-88) 116/88 (06/13 1609) SpO2:  [93 %-98 %] 96 % (06/13 1609)  Physical Exam  Constitutional:  oriented to person, place, and time. appears well-developed and well-nourished. No distress.  HENT: [place]/AT, PERRLA,  no scleral icterus Mouth/Throat: Oropharynx is clear and moist. No oropharyngeal exudate.  Cardiovascular: Normal rate, regular rhythm and normal heart sounds. Exam reveals no gallop and no friction rub.  No murmur heard.  Pulmonary/Chest: Effort normal and breath sounds normal. No respiratory distress.  has no wheezes.  Neck = supple, no nuchal rigidity Abdominal: Soft. Bowel sounds are normal.  exhibits no distension. There is no tenderness.  Lymphadenopathy: no cervical adenopathy. No axillary adenopathy Neurological: alert and oriented to person, place, and time.  Skin: Skin is warm and dry. No rash noted. No erythema.  Psychiatric: a normal mood and affect.  behavior is normal.    Lab Results Recent Labs    04/02/22 0415 04/03/22 0910 04/04/22 0430  WBC 9.0 10.1 8.7  HGB 10.3* 11.4* 10.8*  HCT 33.5* 38.6 35.4*  NA 138  --  140  K 3.4*  --  3.9  CL 106  --  110  CO2 24  --  22  BUN 13  --  18  CREATININE 0.89  --  0.82   Liver Panel Recent Labs    04/02/22 0415 04/04/22 0430  PROT 5.2* 5.7*  ALBUMIN 2.6* 2.7*  AST 15 27  ALT 20 33  ALKPHOS 74 70  BILITOT 0.5 0.5  Lab Results  Component Value Date   ESRSEDRATE 60 (H) 04/01/2022    C-Reactive Protein Recent Labs    04/02/22 0415  CRP 2.2*    Microbiology: Reviewed Chalmers Guest = + c.glabrata, c.albicans Aerobic tissue cx = + growth, but reincubating for better growth Studies/Results: DG Foot 2 Views Right  Result Date: 04/03/2022 CLINICAL DATA:  Right foot ulcer EXAM: RIGHT FOOT - 2 VIEW COMPARISON:  None Available. FINDINGS: No acute fracture or dislocation. Prior transmetatarsal amputation. Small plantar calcaneal spur. Enthesopathic changes of the Achilles tendon insertion. No aggressive osseous lesion. Normal alignment. Soft tissue are unremarkable. No radiopaque foreign body or soft tissue emphysema. IMPRESSION: 1. Prior transmetatarsal amputation. 2. No evidence of osteomyelitis of the right foot.  Electronically Signed   By: Kathreen Devoid M.D.   On: 04/03/2022 15:45   DG Chest Port 1 View  Result Date: 04/03/2022 CLINICAL DATA:  Severe sepsis, shortness of breath EXAM: PORTABLE CHEST 1 VIEW COMPARISON:  Radiograph 03/30/2022 FINDINGS: Note that the exam is mislabeled right versus left. There is a right upper extremity PICC with tip overlying the distal superior vena cava. Unchanged cardiomediastinal silhouette. Interval improvement in bilateral airspace opacities with faint residual opacity seen in the right lung. No large effusion. No pneumothorax. No acute osseous abnormality. IMPRESSION: Interval improvement in bilateral airspace disease, with mild residual opacities in the right lung. Electronically Signed   By: Maurine Simmering M.D.   On: 04/03/2022 08:36     Assessment/Plan: Pneumonia = still plan to do 14 days of iv vancomycin, picc line in place. Repeat cxr showed some improvement  Ulcer to right foot = unchanged on exam. Xray did not show signs of osteo  Uti = cultures still pending but appears to have response to empiric cefepime for which would recommend to continue some gram negative coverage complete tx for uti  Anticipate dc tomorrow morning as long as she remains afebrile. Will follow up on tissue cx results as outpatient.  [facility] Pager: [phone]  04/04/2022, 4:55 PM

## 2022-04-04 NOTE — Inpatient Diabetes Management (Signed)
Inpatient Diabetes Program Recommendations  AACE/ADA: New Consensus Statement on Inpatient Glycemic Control (2015)  Target Ranges:  Prepandial:   less than 140 mg/dL      Peak postprandial:   less than 180 mg/dL (1-2 hours)      Critically ill patients:  140 - 180 mg/dL   Lab Results  Component Value Date   GLUCAP 113 (H) 04/04/2022   HGBA1C 8.6 (H) 03/26/2022    Review of Glycemic Control  Latest Reference Range & Units 04/02/22 11:04 04/02/22 15:51 04/02/22 21:20 04/03/22 06:24 04/03/22 12:11 04/03/22 16:23 04/03/22 20:48 04/04/22 06:32  Glucose-Capillary 70 - 99 mg/dL 625 (H) 638 (H) 937 (H) 104 (H) 227 (H) 299 (H) 313 (H) 113 (H)  (H): Data is abnormally high  Diabetes history: DM2 Outpatient Diabetes medications: None Current orders for Inpatient glycemic control: Novolog 0-9 units correction tid, Prednisone 20 mg qd  Inpatient Diabetes Program Recommendations:   While on steroids, please consider: -Add Novolog 3 units tid meal coverage if eats 50%  Thank you, Darel Hong E. Alfred Harrel, RN, MSN, CDE  Diabetes Coordinator Inpatient Glycemic Control Team Team Pager [phone] (8am-5pm) 04/04/2022 10:44 AM

## 2022-04-04 NOTE — Progress Notes (Signed)
Mobility Specialist: Progress Note   04/04/22 1433  Mobility  Activity Ambulated with assistance in hallway  Level of Assistance Contact guard assist, steadying assist  Assistive Device Other (Comment) (HHA)  Distance Ambulated (ft) 200 ft  Activity Response Tolerated well  $Mobility charge 1 Mobility   Post-Mobility: 108 HR, 124/92 (103) BP, 96% SpO2  Pt received in the bed and agreeable to mobility. Independent with bed mobility and HHA throughout ambulation. Endorsed mild dizziness, pain, and SOB. Pt back to bed after session with call bell and phone in reach.   [facility] Aleane Wesenberg Mobility Specialist Mobility Specialist [address]: [phone]

## 2022-04-05 DIAGNOSIS — M313 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: Secondary | ICD-10-CM | POA: Diagnosis not present

## 2022-04-05 DIAGNOSIS — A419 Sepsis, unspecified organism: Secondary | ICD-10-CM | POA: Diagnosis not present

## 2022-04-05 DIAGNOSIS — Z8701 Personal history of pneumonia (recurrent): Secondary | ICD-10-CM | POA: Diagnosis not present

## 2022-04-05 DIAGNOSIS — Z978 Presence of other specified devices: Secondary | ICD-10-CM

## 2022-04-05 DIAGNOSIS — E2749 Other adrenocortical insufficiency: Secondary | ICD-10-CM

## 2022-04-05 DIAGNOSIS — R6520 Severe sepsis without septic shock: Secondary | ICD-10-CM | POA: Diagnosis not present

## 2022-04-05 LAB — COMPREHENSIVE METABOLIC PANEL
ALT: 31 U/L (ref 0–44)
AST: 19 U/L (ref 15–41)
Albumin: 2.5 g/dL — ABNORMAL LOW (ref 3.5–5.0)
Alkaline Phosphatase: 68 U/L (ref 38–126)
Anion gap: 4 — ABNORMAL LOW (ref 5–15)
BUN: 17 mg/dL (ref 6–20)
CO2: 23 mmol/L (ref 22–32)
Calcium: 8.2 mg/dL — ABNORMAL LOW (ref 8.9–10.3)
Chloride: 109 mmol/L (ref 98–111)
Creatinine, Ser: 0.89 mg/dL (ref 0.44–1.00)
GFR, Estimated: >60 mL/min (ref >60)
Glucose, Bld: 142 mg/dL — ABNORMAL HIGH (ref 70–99)
Potassium: 3.7 mmol/L (ref 3.5–5.1)
Sodium: 136 mmol/L (ref 135–145)
Total Bilirubin: 0.5 mg/dL (ref 0.3–1.2)
Total Protein: 5.2 g/dL — ABNORMAL LOW (ref 6.5–8.1)

## 2022-04-05 LAB — CBC WITH DIFFERENTIAL/PLATELET
Abs Immature Granulocytes: 0.18 10*3/uL — ABNORMAL HIGH (ref 0.00–0.07)
Basophils Absolute: 0 10*3/uL (ref 0.0–0.1)
Basophils Relative: 0 %
Eosinophils Absolute: 0 10*3/uL (ref 0.0–0.5)
Eosinophils Relative: 1 %
HCT: 32.9 % — ABNORMAL LOW (ref 36.0–46.0)
Hemoglobin: 10 g/dL — ABNORMAL LOW (ref 12.0–15.0)
Immature Granulocytes: 2 %
Lymphocytes Relative: 28 %
Lymphs Abs: 2.1 10*3/uL (ref 0.7–4.0)
MCH: 26 pg (ref 26.0–34.0)
MCHC: 30.4 g/dL (ref 30.0–36.0)
MCV: 85.7 fL (ref 80.0–100.0)
Monocytes Absolute: 0.4 10*3/uL (ref 0.1–1.0)
Monocytes Relative: 5 %
Neutro Abs: 4.9 10*3/uL (ref 1.7–7.7)
Neutrophils Relative %: 64 %
Platelets: 268 10*3/uL (ref 150–400)
RBC: 3.84 MIL/uL — ABNORMAL LOW (ref 3.87–5.11)
RDW: 22 % — ABNORMAL HIGH (ref 11.5–15.5)
WBC: 7.6 10*3/uL (ref 4.0–10.5)
nRBC: 0 % (ref 0.0–0.2)

## 2022-04-05 LAB — GLUCOSE, CAPILLARY
Glucose-Capillary: 109 mg/dL — ABNORMAL HIGH (ref 70–99)
Glucose-Capillary: 185 mg/dL — ABNORMAL HIGH (ref 70–99)
Glucose-Capillary: 285 mg/dL — ABNORMAL HIGH (ref 70–99)

## 2022-04-05 LAB — CULTURE, BAL-QUANTITATIVE W GRAM STAIN: Culture: >=100000 — AB

## 2022-04-05 LAB — MAGNESIUM: Magnesium: 2 mg/dL (ref 1.7–2.4)

## 2022-04-05 MED ORDER — GABAPENTIN 300 MG PO CAPS: 600.0000 mg | ORAL_CAPSULE | Freq: Three times a day (TID) | ORAL | Status: DC

## 2022-04-05 MED ORDER — HEPARIN SOD (PORK) LOCK FLUSH 100 UNIT/ML IV SOLN
250.0000 [IU] | INTRAVENOUS | Status: AC | PRN
  Administered 2022-04-05: 250 [IU]

## 2022-04-05 MED ORDER — PREDNISONE 10 MG PO TABS: ORAL_TABLET | ORAL | Status: DC

## 2022-04-05 MED ORDER — APIXABAN 5 MG PO TABS
ORAL_TABLET | ORAL | 1 refills | Status: DC
Start: 2022-04-05 — End: 2022-05-18

## 2022-04-05 MED ORDER — VANCOMYCIN IV (FOR PTA / DISCHARGE USE ONLY): 1250.0000 mg | INTRAVENOUS | 0 refills | Status: AC

## 2022-04-05 MED ORDER — NITROFURANTOIN MONOHYD MACRO 100 MG PO CAPS: 100.0000 mg | ORAL_CAPSULE | Freq: Two times a day (BID) | ORAL | 0 refills | Status: AC

## 2022-04-05 NOTE — Discharge Summary (Signed)
Physician Discharge Summary  [name] MPN:[id number] DOB: [birth year] DOA: 03/26/2022  PCP: Chesley Noon, MD  Admit date: 03/26/2022 Discharge date: 04/05/2022  Admitted From: Home Disposition: Home  Recommendations for Outpatient Follow-up:  Follow up with PCP in 1 week with repeat CBC/BMP Outpatient follow-up with ID/pulmonary/rheumatology Follow up in ED if symptoms worsen or new appear   Home Health: No Equipment/Devices: None  Discharge Condition: Stable CODE STATUS: Full Diet recommendation: Heart healthy  Brief/Interim Summary: 56 year old female with a history of Wegener's angiitis, chronic opioid dependence, hyperlipidemia, hypertension, impaired glucose tolerance, 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  Diagnosed with sepsis due to pneumonia and transferred to [facility] for further care.  ID and pulmonary were consulted.  She underwent bronchoscopy on 03/30/2022.  She is currently on IV vancomycin and has a PICC line.  ID recommends to continue IV vancomycin till 04/08/2022.  She is currently on IV cefepime for possible UTI and ID is recommending switching to oral Macrobid for 4 more days on discharge.  She was also found to have acute pulmonary embolism and currently on Eliquis.  She is currently hemodynamically stable.  She was to go home today.  She will be discharged home today with close follow-up with ID and PCP.  Also needs follow-up with pulmonary and rheumatology.  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  Pulmonary and ID were consulted. -Status post bronchoscopy on 03/30/2022.  Cultures so far only grew Candida which was possibly colonizer; fungitell assay was  negative -Currently on IV vancomycin per ID.  Patient has a PICC line. ID recommends to continue IV vancomycin till 04/08/2022.  Outpatient follow-up with ID.  Possible new UTI -Started having intense dysuria with fevers on 04/03/2022.  Empirically started on cefepime.  Cultures have remained negative so far.  Afebrile currently.  ID has recommended to discharge home on oral Macrobid for 4 more days.  Acute pulmonary embolism -Echo showed EF of 63% with no RV strain.  Currently on oral Eliquis.  Unremarkable lower extremity venous duplex.  Discharge home on Eliquis.  Granulomatosis with polyangiitis -Outpatient follow-up with rheumatology.  On weekly methotrexate.  Currently on prednisone 20 mg daily.  On discharge, take prednisone 10 mg daily for 2 days then return back to daily 5 mg home dose.  Acute toxic and metabolic encephalopathy -Due to combination of sepsis/infection and high-dose narcotics and multiple sedatives on board -She has been counseled not to overuse narcotics as risk of respiratory failure and intubation is high, minimize offending medications, as needed Narcan, head CT is negative and she has no focal deficits.  Mentation much improved on 03/30/2022 with adjusted medications.  Opiate dependence -Currently on various opiates and other pain medications.  Outpatient follow-up with pain management.  Chemotherapy-induced peripheral neuropathy -Patient is on gabapentin, Cymbalta, amitriptyline topiramate, trazodone, medications adjusted to reduce encephalopathy and sedation.  Outpatient follow-up  Presence of intrathecal pump -Patient has intrathecal pump with bupivacaine and ziconotide, Last refill 03/02/2022.  Stage IIIa chronic kidney disease -Creatinine stable.  Outpatient follow-up  Obesity -Outpatient follow-up  Discharge Instructions  Discharge Instructions     [facility] pharmacist to adjust dose for Vancomycin, Aminoglycosides and other anti-infective  therapies as requested by physician.   Complete by: As directed  [facility] to provide Cath Flo 28m   Complete by: As directed    Administer for PICC line occlusion and as ordered by physician for other access device issues.   Ambulatory referral to Infectious Disease   Complete by: As directed    Anaphylaxis Kit: Provided to treat any anaphylactic reaction to the medication being provided to the patient if First Dose or when requested by physician   Complete by: As directed    Epinephrine 151mml vial / amp: Administer 0.65m11m0.65ml72mubcutaneously once for moderate to severe anaphylaxis, nurse to call physician and pharmacy when reaction occurs and call 911 if needed for immediate care   Diphenhydramine 50mg75mIV vial: Administer 25-50mg 4mM PRN for first dose reaction, rash, itching, mild reaction, nurse to call physician and pharmacy when reaction occurs   Sodium Chloride 0.9% NS 500ml I60mdminister if needed for hypovolemic blood pressure drop or as ordered by physician after call to physician with anaphylactic reaction   Change dressing on IV access line weekly and PRN   Complete by: As directed    Diet - low sodium heart healthy   Complete by: As directed    Flush IV access with Sodium Chloride 0.9% and Heparin 10 units/ml or 100 units/ml   Complete by: As directed    Home infusion instructions - [facility]   Complete by: As directed    Instructions: Flush IV access with Sodium Chloride 0.9% and Heparin 10units/ml or 100units/ml   Change dressing on IV access line: Weekly and PRN   Instructions Cath Flo 2mg: Ad30mister for PICC Line occlusion and as ordered by physician for other access device   [facility] pharmacist to adjust dose for: Vancomycin, Aminoglycosides and other anti-infective therapies as requested by physician   Increase activity slowly   Complete by: As directed    Method of administration may be changed at the discretion of home  infusion pharmacist based upon assessment of the patient and/or caregiver's ability to self-administer the medication ordered   Complete by: As directed    No wound care   Complete by: As directed       Allergies as of 04/05/2022       Reactions   Ibuprofen Other (See Comments)   Pt is unable to take this due to kidney problems.     Penicillins Rash, Other (See Comments)   Has patient had a PCN reaction causing immediate rash, facial/tongue/throat swelling, SOB or lightheadedness with hypotension: No Has patient had a PCN reaction causing severe rash involving mucus membranes or skin necrosis: No Has patient had a PCN reaction that required hospitalization No Has patient had a PCN reaction occurring within the last 10 years: No If all of the above answers are "NO", then may proceed with Cephalosporin use.        Medication List     STOP taking these medications    nystatin 100000 UNIT/ML suspension Commonly known as: MYCOSTATIN   Oxycodone HCl 10 MG Tabs       TAKE these medications    acetaminophen 325 MG tablet Commonly known as: TYLENOL Take 2 tablets (650 mg total) by mouth every 6 (six) hours as needed for mild pain (or Fever >/= 101).   albuterol 108 (90 Base) MCG/ACT inhaler Commonly known as: VENTOLIN HFA Inhale 2 puffs into the lungs every 6 (six) hours as needed for wheezing or shortness of breath.   amitriptyline 50 MG tablet Commonly known as: ELAVIL Take  50 mg by mouth at bedtime.   apixaban 5 MG Tabs tablet Commonly known as: ELIQUIS 62m BID till 04/06/22 then 5 mg BID from 04/07/22   CALCIUM CARBONATE-VITAMIN D3 PO Take 1 tablet by mouth daily.   DULoxetine 60 MG capsule Commonly known as: CYMBALTA Take 60 mg by mouth daily.   folic acid 1 MG tablet Commonly known as: FOLVITE Take 2 mg by mouth daily.   gabapentin 300 MG capsule Commonly known as: NEURONTIN Take 2 capsules (600 mg total) by mouth 3 (three) times daily. What changed:   how much to take when to take this additional instructions   K-DUR PO Take 40 mEq by mouth every other day.   lidocaine 5 % Commonly known as: LIDODERM Place 1 patch onto the skin daily as needed (for pain). Remove & Discard patch within 12 hours or as directed by MD   linaclotide 145 MCG Caps capsule Commonly known as: LINZESS Take 145 mcg by mouth daily before breakfast.   Melatonin 10 MG Tabs Take 10 mg by mouth at bedtime as needed (sleep).   metFORMIN 500 MG 24 hr tablet Commonly known as: GLUCOPHAGE-XR Take 500 mg by mouth daily with breakfast.   methocarbamol 500 MG tablet Commonly known as: ROBAXIN Take 500 mg by mouth every 6 (six) hours as needed for muscle spasms.   METHOTREXATE SODIUM IJ Inject 25 mg into the skin every Sunday.   naloxone 4 MG/0.1ML Liqd nasal spray kit Commonly known as: NARCAN Place 0.4 mg into the nose once.   nitrofurantoin (macrocrystal-monohydrate) 100 MG capsule Commonly known as: Macrobid Take 1 capsule (100 mg total) by mouth 2 (two) times daily for 4 days.   pantoprazole 40 MG tablet Commonly known as: PROTONIX Take 40 mg by mouth daily.   predniSONE 10 MG tablet Commonly known as: DELTASONE Take 1 tab (159m daily for 2 days, then reduce to your home dose of 73m81maily What changed: additional instructions   rosuvastatin 10 MG tablet Commonly known as: CRESTOR Take 10 mg by mouth at bedtime.   senna 8.6 MG Tabs tablet Commonly known as: SENOKOT Take 2 tablets (17.2 mg total) by mouth 2 (two) times daily.   sulfamethoxazole-trimethoprim 400-80 MG tablet Commonly known as: BACTRIM Take 1 tablet by mouth 3 (three) times a week. Monday,Wednesday and Friday   tapentadol HCl 75 MG tablet Commonly known as: NUCYNTA Take 75 mg by mouth. Take 1 tablet in the morning, 1 tablet in the afternoon and 2 tablets at bedtime   topiramate 100 MG tablet Commonly known as: TOPAMAX Take 100 mg by mouth daily.   vancomycin   IVPB Inject 1,250 mg into the vein daily for 3 days. Indication:  pneumonia First Dose: Yes Last Day of Therapy:  04/08/2022 Labs - Sunday/Monday:  CBC/D, BMP, and vancomycin trough. Labs - Thursday:  BMP and vancomycin trough Labs - Every other week:  ESR and CRP Method of administration: Elastomeric Method of administration may be changed at the discretion of the patient and/or caregiver's ability to self-administer the medication ordered.   vitamin B-12 1000 MCG tablet Commonly known as: CYANOCOBALAMIN Take 1,000 mcg by mouth daily.   Vitamin D (Ergocalciferol) 1.25 MG (50000 UNIT) Caps capsule Commonly known as: DRISDOL Take 50,000 Units by mouth every Friday.               Durable Medical Equipment  (From admission, onward)           Start  Ordered   03/31/22 1144  For home use only DME Walker rolling  Once       Comments: 5 wheel  Question Answer Comment  Walker: With [place]   Patient needs a walker to treat with the following condition Weakness      03/31/22 1143              Discharge Care Instructions  (From admission, onward)           Start     Ordered   04/05/22 0000  Change dressing on IV access line weekly and PRN  (Home infusion instructions - [facility] )        04/05/22 1143            Follow-up Information     Alvira Monday, [name] Follow up.   Specialty: Family Medicine Why: TIME: 11:00 am DATE:JUNE [id number] PT'S NEED TO CALL OFFICE BEFORE APPT Contact information: [address] [place] [address] [phone]         [facility] Follow up.   Specialty: Home Health Services Why: HHRN/PT/SLP/Aide- arranged- they will contact you to schedule home visits Contact information: [address] [place] [address] [phone]                Allergies  Allergen Reactions   Ibuprofen Other (See Comments)    Pt is unable to take this due to kidney problems.       Penicillins Rash and Other (See Comments)    Has patient had a PCN reaction causing immediate rash, facial/tongue/throat swelling, SOB or lightheadedness with hypotension: No Has patient had a PCN reaction causing severe rash involving mucus membranes or skin necrosis: No Has patient had a PCN reaction that required hospitalization No Has patient had a PCN reaction occurring within the last 10 years: No If all of the above answers are "NO", then may proceed with Cephalosporin use.    Consultations: ID/pulmonary   Procedures/Studies: DG Foot 2 Views Right  Result Date: 04/03/2022 CLINICAL DATA:  Right foot ulcer EXAM: RIGHT FOOT - 2 VIEW COMPARISON:  None Available. FINDINGS: No acute fracture or dislocation. Prior transmetatarsal amputation. Small plantar calcaneal spur. Enthesopathic changes of the Achilles tendon insertion. No aggressive osseous lesion. Normal alignment. Soft tissue are unremarkable. No radiopaque foreign body or soft tissue emphysema. IMPRESSION: 1. Prior transmetatarsal amputation. 2. No evidence of osteomyelitis of the right foot. Electronically Signed   By: Kathreen Devoid M.D.   On: 04/03/2022 15:45   DG Chest Port 1 View  Result Date: 04/03/2022 CLINICAL DATA:  Severe sepsis, shortness of breath EXAM: PORTABLE CHEST 1 VIEW COMPARISON:  Radiograph 03/30/2022 FINDINGS: Note that the exam is mislabeled right versus left. There is a right upper extremity PICC with tip overlying the distal superior vena cava. Unchanged cardiomediastinal silhouette. Interval improvement in bilateral airspace opacities with faint residual opacity seen in the right lung. No large effusion. No pneumothorax. No acute osseous abnormality. IMPRESSION: Interval improvement in bilateral airspace disease, with mild residual opacities in the right lung. Electronically Signed   By: Maurine Simmering M.D.   On: 04/03/2022 08:36   [place] EKG SITE RITE  Result Date: 03/31/2022 If Site Rite image not attached,  placement could not be confirmed due to current cardiac rhythm.  VAS [place] LOWER EXTREMITY VENOUS (DVT)  Result Date: 03/30/2022  Lower Venous DVT Study Patient Name:  [name]  Date of Exam:  03/30/2022 Medical Rec #: [id number]    Accession #:    [id number] Date of Birth: 12-30-1965    Patient Gender: F Patient Age:   56 years Exam Location:  [facility] Procedure:      VAS [place] LOWER EXTREMITY VENOUS (DVT) Referring Phys: Deno Etienne [facility] --------------------------------------------------------------------------------  Indications: Swelling, pulmonary embolism, and Elevated D-dimer.  Performing Technologist: Bobetta Lime  Examination Guidelines: A complete evaluation includes B-mode imaging, spectral Doppler, color Doppler, and power Doppler as needed of all accessible portions of each vessel. Bilateral testing is considered an integral part of a complete examination. Limited examinations for reoccurring indications may be performed as noted. The reflux portion of the exam is performed with the patient in reverse Trendelenburg.  +---------+---------------+---------+-----------+----------+--------------+ RIGHT    CompressibilityPhasicitySpontaneityPropertiesThrombus Aging +---------+---------------+---------+-----------+----------+--------------+ CFV      Full           Yes      Yes                                 +---------+---------------+---------+-----------+----------+--------------+ SFJ      Full                                                        +---------+---------------+---------+-----------+----------+--------------+ FV Prox  Full                                                        +---------+---------------+---------+-----------+----------+--------------+ FV Mid   Full                                                        +---------+---------------+---------+-----------+----------+--------------+ FV DistalFull                                                         +---------+---------------+---------+-----------+----------+--------------+ PFV      Full                                                        +---------+---------------+---------+-----------+----------+--------------+ POP      Full           Yes      Yes                                 +---------+---------------+---------+-----------+----------+--------------+ PTV      Full                                                        +---------+---------------+---------+-----------+----------+--------------+  PERO     Full                                                        +---------+---------------+---------+-----------+----------+--------------+   +---------+---------------+---------+-----------+----------+--------------+ LEFT     CompressibilityPhasicitySpontaneityPropertiesThrombus Aging +---------+---------------+---------+-----------+----------+--------------+ CFV      Full           Yes      Yes                                 +---------+---------------+---------+-----------+----------+--------------+ SFJ      Full                                                        +---------+---------------+---------+-----------+----------+--------------+ FV Prox  Full                                                        +---------+---------------+---------+-----------+----------+--------------+ FV Mid   Full                                                        +---------+---------------+---------+-----------+----------+--------------+ FV DistalFull                                                        +---------+---------------+---------+-----------+----------+--------------+ PFV      Full                                                        +---------+---------------+---------+-----------+----------+--------------+ POP      Full           Yes      Yes                                  +---------+---------------+---------+-----------+----------+--------------+ PTV      Full                                                        +---------+---------------+---------+-----------+----------+--------------+ PERO     Full                                                        +---------+---------------+---------+-----------+----------+--------------+  Summary: RIGHT: - There is no evidence of deep vein thrombosis in the lower extremity.  - No cystic structure found in the popliteal fossa.  LEFT: - There is no evidence of deep vein thrombosis in the lower extremity.  - No cystic structure found in the popliteal fossa.  *See table(s) above for measurements and observations. Electronically signed by Monica Martinez MD on 03/30/2022 at 4:47:59 PM.    Final    DG CHEST PORT 1 VIEW  Result Date: 03/30/2022 CLINICAL DATA:  Status post right lung biopsy EXAM: PORTABLE CHEST 1 VIEW COMPARISON:  03/29/2022 FINDINGS: Unchanged AP portable chest radiograph with subtle right greater than left upper lobe heterogeneous airspace opacities and probable small, layering bilateral pleural effusions. No new airspace opacity. No pneumothorax. Mild cardiomegaly. The visualized skeletal structures are unremarkable. IMPRESSION: Unchanged AP portable chest radiograph with subtle right greater than left upper lobe heterogeneous airspace opacities and probable small, layering bilateral pleural effusions. No new airspace opacity. No pneumothorax following biopsy. Electronically Signed   By: Delanna Ahmadi M.D.   On: 03/30/2022 13:24   DG ESOPHAGUS W SINGLE CM (SOL OR THIN BA)  Result Date: 03/30/2022 CLINICAL DATA:  Patient complaining of frequent globus sensation, regurgitation, and history of esophageal stricture. EXAM: ESOPHAGUS/BARIUM SWALLOW/TABLET STUDY TECHNIQUE: Single contrast examination was performed using thin liquid barium. This exam was performed by Gareth Eagle, PA-C, and was supervised and  interpreted by Dr. Fabiola Backer. FLUOROSCOPY: Radiation Exposure Index (as provided by the fluoroscopic device): 25.8 mGy Kerma COMPARISON:  CT chest dated March 27, 2022. FINDINGS: Study was limited secondary to patient's mobility issues and inability to stand. Swallowing: No aspiration seen. Esophagus: Smooth focal fixed narrowing of the distal esophagus just superior to the GE junction. No mucosal irregularity or defect. Esophageal motility: Mild dysmotility manifested by intermittent tertiary contractions. Hiatal Hernia: None. Gastroesophageal reflux: None visualized. Ingested 69m barium tablet: Became stuck and did not pass beyond the gastroesophageal junction despite giving water and barium. Other: None. IMPRESSION: 1. Short-segment stricture of the distal esophagus just superior to the GE junction which did not allow passage of a barium tablet. This is likely reflux related, although no reflux was elicited on today's study. 2. Mild esophageal dysmotility. Electronically Signed   By: WTitus DubinM.D.   On: 03/30/2022 09:27   DG Chest Port 1 View  Result Date: 03/29/2022 CLINICAL DATA:  Shortness of breath and cough. EXAM: PORTABLE CHEST 1 VIEW COMPARISON:  03/26/2022 FINDINGS: Stable cardiomediastinal contours. Lungs are suboptimally inflated. No signs of pleural effusion or edema. Persistent asymmetric airspace opacification within the right upper lobe. IMPRESSION: Persistent asymmetric airspace opacification within the right upper lobe compatible with pneumonia. Electronically Signed   By: TKerby MoorsM.D.   On: 03/29/2022 06:43   ECHOCARDIOGRAM COMPLETE  Result Date: 03/28/2022    ECHOCARDIOGRAM REPORT   Patient Name:   [name] Date of Exam: 03/28/2022 Medical Rec #:  [id number]  Height:       64.0 in Accession #:    [id number] Weight:       211.4 lb Date of Birth:  206/10/1965  BSA:          2.003 m Patient Age:    57years    BP:           121/84 mmHg Patient Gender: F           HR:            86 bpm.  Exam Location:  [facility] Procedure: 2D Echo, Cardiac Doppler and Color Doppler Indications:    Pulmonary embolus  History:        Patient has no prior history of Echocardiogram examinations.                 Risk Factors:Hypertension and Dyslipidemia.  Sonographer:    Wenda Low Referring Phys: [phone] DAVID TAT IMPRESSIONS  1. Left ventricular ejection fraction, by estimation, is 55%. The left ventricle has normal function. The left ventricle has no regional wall motion abnormalities. Left ventricular diastolic parameters were normal.  2. Right ventricular systolic function is normal. The right ventricular size is normal. There is normal pulmonary artery systolic pressure.  3. The pericardial effusion is anterior to the right ventricle.  4. The mitral valve is abnormal. Trivial mitral valve regurgitation. No evidence of mitral stenosis.  5. The aortic valve is tricuspid. There is mild calcification of the aortic valve. Aortic valve regurgitation is not visualized. Aortic valve sclerosis is present, with no evidence of aortic valve stenosis.  6. The inferior vena cava is normal in size with greater than 50% respiratory variability, suggesting right atrial pressure of 3 mmHg. FINDINGS  Left Ventricle: Left ventricular ejection fraction, by estimation, is 55%. The left ventricle has normal function. The left ventricle has no regional wall motion abnormalities. The left ventricular internal cavity size was normal in size. There is no left ventricular hypertrophy. Left ventricular diastolic parameters were normal. Right Ventricle: The right ventricular size is normal. No increase in right ventricular wall thickness. Right ventricular systolic function is normal. There is normal pulmonary artery systolic pressure. The tricuspid regurgitant velocity is 1.76 m/s, and  with an assumed right atrial pressure of 3 mmHg, the estimated right ventricular systolic pressure is 45.0 mmHg. Left Atrium: Left atrial size  was normal in size. Right Atrium: Right atrial size was normal in size. Pericardium: Trivial pericardial effusion is present. The pericardial effusion is anterior to the right ventricle. Mitral Valve: The mitral valve is abnormal. There is mild thickening of the mitral valve leaflet(s). There is mild calcification of the mitral valve leaflet(s). Trivial mitral valve regurgitation. No evidence of mitral valve stenosis. MV peak gradient, 6.5 mmHg. The mean mitral valve gradient is 3.0 mmHg. Tricuspid Valve: The tricuspid valve is normal in structure. Tricuspid valve regurgitation is trivial. No evidence of tricuspid stenosis. Aortic Valve: The aortic valve is tricuspid. There is mild calcification of the aortic valve. Aortic valve regurgitation is not visualized. Aortic valve sclerosis is present, with no evidence of aortic valve stenosis. Aortic valve mean gradient measures 5.0 mmHg. Aortic valve peak gradient measures 10.8 mmHg. Aortic valve area, by VTI measures 2.22 cm. Pulmonic Valve: The pulmonic valve was normal in structure. Pulmonic valve regurgitation is not visualized. No evidence of pulmonic stenosis. Aorta: The aortic root is normal in size and structure. Venous: The inferior vena cava is normal in size with greater than 50% respiratory variability, suggesting right atrial pressure of 3 mmHg. IAS/Shunts: No atrial level shunt detected by color flow Doppler.  LEFT VENTRICLE PLAX 2D LVIDd:         4.90 cm     Diastology LVIDs:         3.50 cm     LV e' medial:    7.72 cm/s LV PW:         1.00 cm     LV E/e' medial:  15.2 LV IVS:  0.90 cm     LV e' lateral:   14.60 cm/s LVOT diam:     2.00 cm     LV E/e' lateral: 8.0 LV SV:         76 LV SV Index:   38 LVOT Area:     3.14 cm  LV Volumes (MOD) LV vol d, MOD A2C: 60.2 ml LV vol d, MOD A4C: 49.4 ml LV vol s, MOD A2C: 24.4 ml LV vol s, MOD A4C: 23.8 ml LV SV MOD A2C:     35.8 ml LV SV MOD A4C:     49.4 ml LV SV MOD BP:      30.8 ml RIGHT VENTRICLE RV  Basal diam:  3.15 cm RV Mid diam:    2.80 cm RV S prime:     13.40 cm/s TAPSE (M-mode): 2.3 cm LEFT ATRIUM             Index        RIGHT ATRIUM           Index LA diam:        3.70 cm 1.85 cm/m   RA Area:     15.50 cm LA Vol (A2C):   46.0 ml 22.96 ml/m  RA Volume:   37.70 ml  18.82 ml/m LA Vol (A4C):   50.9 ml 25.41 ml/m LA Biplane Vol: 50.3 ml 25.11 ml/m  AORTIC VALVE                     PULMONIC VALVE AV Area (Vmax):    2.38 cm      PV Vmax:       0.90 m/s AV Area (Vmean):   2.30 cm      PV Peak grad:  3.2 mmHg AV Area (VTI):     2.22 cm AV Vmax:           164.00 cm/s AV Vmean:          107.000 cm/s AV VTI:            0.344 m AV Peak Grad:      10.8 mmHg AV Mean Grad:      5.0 mmHg LVOT Vmax:         124.00 cm/s LVOT Vmean:        78.500 cm/s LVOT VTI:          0.243 m LVOT/AV VTI ratio: 0.71  AORTA Ao Root diam: 2.90 cm MITRAL VALVE                TRICUSPID VALVE MV Area (PHT): 3.68 cm     TR Peak grad:   12.4 mmHg MV Area VTI:   2.13 cm     TR Vmax:        176.00 cm/s MV Peak grad:  6.5 mmHg MV Mean grad:  3.0 mmHg     SHUNTS MV Vmax:       1.27 m/s     Systemic VTI:  0.24 m MV Vmean:      77.6 cm/s    Systemic Diam: 2.00 cm MV Decel Time: 206 msec MV E velocity: 117.00 cm/s MV A velocity: 89.50 cm/s MV E/A ratio:  1.31 Jenkins Rouge MD Electronically signed by Jenkins Rouge MD Signature Date/Time: 03/28/2022/12:20:15 PM    Final    CT Angio Chest Pulmonary Embolism (PE) W or WO Contrast  Result Date: 03/27/2022 CLINICAL DATA:  Chest pain and shortness of breath for several weeks, recent  pneumonia EXAM: CT ANGIOGRAPHY CHEST WITH CONTRAST TECHNIQUE: Multidetector CT imaging of the chest was performed using the standard protocol during bolus administration of intravenous contrast. Multiplanar CT image reconstructions and MIPs were obtained to evaluate the vascular anatomy. RADIATION DOSE REDUCTION: This exam was performed according to the departmental dose-optimization program which includes automated  exposure control, adjustment of the mA and/or kV according to patient size and/or use of iterative reconstruction technique. CONTRAST:  29m OMNIPAQUE IOHEXOL 300 MG/ML  SOLN IV COMPARISON:  CT chest 01/04/2022 FINDINGS: Cardiovascular: Aorta normal caliber without aneurysm or dissection. Proximal great vessels unremarkable. Heart normal appearance. No pericardial effusion. Pulmonary arteries adequately opacified. Two small filling defects are identified consistent with pulmonary embolism, in RIGHT lower lobe image 214 and in RIGHT middle lobe image 160. No additional emboli. Mediastinum/Nodes: Esophagus unremarkable. Few normal sized mediastinal lymph nodes. No thoracic adenopathy. Base of cervical region normal appearance. Lungs/Pleura: Small BILATERAL pleural effusions. BILATERAL pulmonary infiltrates, greatest in RIGHT upper lobe. No pneumothorax or mass. Upper Abdomen: Gallbladder surgically absent. Splenic deformity question related to prior infarct or surgery. Musculoskeletal: No acute osseous findings. Review of the MIP images confirms the above findings. IMPRESSION: Small pulmonary emboli in RIGHT lung. Small BILATERAL pleural effusions. Patchy BILATERAL pulmonary infiltrates consistent with multifocal pneumonia greatest in RIGHT upper lobe. Critical Value/emergent results were called by telephone at the time of interpretation on 03/27/2022 at 1510 hrs to provider DAVID TAT MD, who verbally acknowledged these results. Electronically Signed   By: MLavonia DanaM.D.   On: 03/27/2022 15:13   CT Head Wo Contrast  Result Date: 03/26/2022 CLINICAL DATA:  Altered mental status.  Recent stroke. EXAM: CT HEAD WITHOUT CONTRAST TECHNIQUE: Contiguous axial images were obtained from the base of the skull through the vertex without intravenous contrast. RADIATION DOSE REDUCTION: This exam was performed according to the departmental dose-optimization program which includes automated exposure control, adjustment of the mA  and/or kV according to patient size and/or use of iterative reconstruction technique. COMPARISON:  11/01/2020 FINDINGS: Brain: No evidence of intracranial hemorrhage, acute infarction, hydrocephalus, extra-axial collection, or mass lesion/mass effect. 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 Vascular:  No hyperdense vessel or other acute findings. Skull: No evidence of fracture or other significant bone abnormality. Sinuses/Orbits:  No acute findings. Other: None. IMPRESSION: No acute intracranial abnormality. Electronically Signed   By: JMarlaine HindM.D.   On: 03/26/2022 11:42   DG Chest Port 1 View  Result Date: 03/26/2022 CLINICAL DATA:  Sepsis. EXAM: PORTABLE CHEST 1 VIEW COMPARISON:  February 19, 2022 FINDINGS: There is infiltrate in the right mid lung. There may be infiltrate in the medial left upper lung. No pneumothorax. No other acute interval changes. The study is limited however due to portable technique. IMPRESSION: The study is limited due to low volume portable technique. However, there is an infiltrate in the right mid lung consistent with for pneumonia given history. There may be a more subtle smaller infiltrate in the medial left upper lung. Electronically Signed   By: DDorise BullionIII M.D.   On: 03/26/2022 10:22      Subjective: Patient seen and examined at bedside.  Feels okay to go home today.  Denies worsening fever, nausea, vomiting.  Discharge Exam: Vitals:   04/05/22 0817 04/05/22 1143  BP: (!) 113/91 114/71  Pulse: 98 79  Resp: 18 16  Temp: 98.1 F (36.7 C) 99 F (37.2 C)  SpO2: 95%  93%    General: Pt is alert, awake, not in acute distress.  Looks chronically ill and deconditioned.  Currently on room air. Cardiovascular: rate controlled, S1/S2 + Respiratory: bilateral decreased breath sounds at bases with some scattered crackles Abdominal: Soft, NT, ND, bowel sounds + Extremities: Trace lower  extremity edema, no cyanosis    The results of significant diagnostics from this hospitalization (including imaging, microbiology, ancillary and laboratory) are listed below for reference.     Microbiology: Recent Results (from the past 240 hour(s))  Urine Culture     Status: Abnormal   Collection Time: 03/26/22 12:14 PM   Specimen: Urine, Catheterized  Result Value Ref Range Status   Specimen Description   Final    URINE, CATHETERIZED Performed at [facility], [address] [place], [place] [address]    Special Requests   Final    NONE Performed at [facility], [address] [place], [place] [address]    Culture (A)  Final    40,000 COLONIES/mL ESCHERICHIA COLI Two isolates with different morphologies were identified as the same organism.The most resistant organism was reported. Performed at [facility], [place] [address], [place], [place] [address]    Report Status 03/29/2022 FINAL  Final   Organism ID, Bacteria ESCHERICHIA COLI (A)  Final      Susceptibility   Escherichia coli - MIC*    AMPICILLIN >=32 RESISTANT Resistant     CEFAZOLIN >=64 RESISTANT Resistant     CEFEPIME <=0.12 SENSITIVE Sensitive     CEFTRIAXONE 32 RESISTANT Resistant     CIPROFLOXACIN >=4 RESISTANT Resistant     GENTAMICIN <=1 SENSITIVE Sensitive     IMIPENEM Value in next row Sensitive      SENSITIVE1    NITROFURANTOIN Value in next row Sensitive      SENSITIVE1    TRIMETH/SULFA Value in next row Resistant      SENSITIVE1    AMPICILLIN/SULBACTAM Value in next row Resistant      SENSITIVE1    PIP/TAZO Value in next row Resistant      SENSITIVE1    * 40,000 COLONIES/mL ESCHERICHIA COLI  Resp Panel by RT-PCR (Flu A&B, Covid) Anterior Nasal Swab     Status: None   Collection Time: 03/26/22 12:19 PM   Specimen: Anterior Nasal Swab  Result Value Ref Range Status   SARS Coronavirus 2 by RT PCR NEGATIVE NEGATIVE Final    Comment: (NOTE) SARS-CoV-2 target nucleic acids are NOT  DETECTED.  The SARS-CoV-2 RNA is generally detectable in upper respiratory specimens during the acute phase of infection. The lowest concentration of SARS-CoV-2 viral copies this assay can detect is 138 copies/mL. A negative result does not preclude SARS-Cov-2 infection and should not be used as the sole basis for treatment or other patient management decisions. A negative result may occur with  improper specimen collection/handling, submission of specimen other than nasopharyngeal swab, presence of viral mutation(s) within the areas targeted by this assay, and inadequate number of viral copies(<138 copies/mL). A negative result must be combined with clinical observations, patient history, and epidemiological information. The expected result is Negative.  Fact Sheet for Patients:  EntrepreneurPulse.com.au  Fact Sheet for Healthcare Providers:  IncredibleEmployment.be  This test is no t yet approved or cleared by the [place] FDA and  has been authorized for detection and/or diagnosis of SARS-CoV-2 by FDA under an Emergency Use Authorization (EUA). This EUA will remain  in effect (meaning this test can be used) for the  duration of the COVID-19 declaration under Section 564(b)(1) of the Act, 21 U.S.C.section 360bbb-3(b)(1), unless the authorization is terminated  or revoked sooner.       Influenza A by PCR NEGATIVE NEGATIVE Final   Influenza B by PCR NEGATIVE NEGATIVE Final    Comment: (NOTE) The Xpert Xpress SARS-CoV-2/FLU/RSV plus assay is intended as an aid in the diagnosis of influenza from Nasopharyngeal swab specimens and should not be used as a sole basis for treatment. Nasal washings and aspirates are unacceptable for Xpert Xpress SARS-CoV-2/FLU/RSV testing.  Fact Sheet for Patients: EntrepreneurPulse.com.au  Fact Sheet for Healthcare Providers: IncredibleEmployment.be  This test is not yet  approved or cleared by the [place] FDA and has been authorized for detection and/or diagnosis of SARS-CoV-2 by FDA under an Emergency Use Authorization (EUA). This EUA will remain in effect (meaning this test can be used) for the duration of the COVID-19 declaration under Section 564(b)(1) of the Act, 21 U.S.C. section 360bbb-3(b)(1), unless the authorization is terminated or revoked.  Performed at [facility], [address], [place], [place] [address]   MRSA Next Gen by PCR, Nasal     Status: None   Collection Time: 03/26/22  2:10 PM   Specimen: Nasal Mucosa; Nasal Swab  Result Value Ref Range Status   MRSA by PCR Next Gen NOT DETECTED NOT DETECTED Final    Comment: (NOTE) The GeneXpert MRSA Assay (FDA approved for NASAL specimens only), is one component of a comprehensive MRSA colonization surveillance program. It is not intended to diagnose MRSA infection nor to guide or monitor treatment for MRSA infections. Test performance is not FDA approved in patients less than 29 years old. Performed at [facility], [address], [place], [place] [address]   MRSA Next Gen by PCR, Nasal     Status: None   Collection Time: 03/29/22  6:34 AM   Specimen: Nasal Mucosa; Nasal Swab  Result Value Ref Range Status   MRSA by PCR Next Gen NOT DETECTED NOT DETECTED Final    Comment: (NOTE) The GeneXpert MRSA Assay (FDA approved for NASAL specimens only), is one component of a comprehensive MRSA colonization surveillance program. It is not intended to diagnose MRSA infection nor to guide or monitor treatment for MRSA infections. Test performance is not FDA approved in patients less than 69 years old. Performed at [facility], [place] [address] [place], [place] [address]   Acid Fast Smear (AFB)     Status: None   Collection Time: 03/30/22  8:42 AM   Specimen: Bronchoalveolar Lavage  Result Value Ref Range Status   AFB Specimen Processing Concentration  Final   Acid Fast Smear  Negative  Final    Comment: (NOTE) Performed At: [facility] [place], [place] [id number] Rush Farmer MD [id number]    Source (AFB) BAL RUL  Final    Comment: Performed at [facility], [place] [address], [place], [place] [address]  Fungus Culture With Stain     Status: Abnormal (Preliminary result)   Collection Time: 03/30/22  8:42 AM   Specimen: Bronchial Alveolar Lavage  Result Value Ref Range Status   Fungus Stain Final report (A)  Final    Comment: (NOTE) Performed At: [facility] [place], [place] [id number] Rush Farmer MD [id number]    Fungus (Mycology) Culture PENDING  Incomplete   Fungal Source BAL RUL  Final    Comment: Performed at [facility], [place]  [address] [place], [place] [address]  Aerobic Culture w Gram Stain (superficial specimen)     Status: None (Preliminary result)   Collection Time: 03/30/22  8:42 AM   Specimen: Tissue; Lung  Result Value Ref Range Status   Specimen Description TISSUE  Final   Special Requests Immunocompromised  Final   Gram Stain NO WBC SEEN NO ORGANISMS SEEN   Final   Culture   Final    CULTURE REINCUBATED FOR BETTER GROWTH Performed at [facility], [address], [place], [place] [address]    Report Status PENDING  Incomplete  Acid Fast Smear (AFB)     Status: None   Collection Time: 03/30/22  8:42 AM   Specimen: Lung; Tissue  Result Value Ref Range Status   AFB Specimen Processing Concentration  Final   Acid Fast Smear Negative  Final    Comment: (NOTE) Performed At: [facility] [place], [place] [id number] Rush Farmer MD [id number]    Source (AFB) TISSUE  Final    Comment: Performed at [facility], [place] [address], [place], [place] [address]  Fungus Culture Result     Status: Abnormal   Collection Time: 03/30/22  8:42 AM  Result Value Ref Range Status   Result 1 Yeast observed (A)  Final    Comment:  (NOTE) Performed At: [facility] [place], [place] [id number] Rush Farmer MD [id number]   Culture, fungus without smear     Status: Abnormal (Preliminary result)   Collection Time: 03/30/22 12:00 PM   Specimen: Tissue; Other  Result Value Ref Range Status   Specimen Description TISSUE  Final   Special Requests RUL LUNG  Final   Culture (A)  Final    CANDIDA ALBICANS CONTINUING TO HOLD Performed at [facility], [place] [address] [place], [place] [address]    Report Status PENDING  Incomplete  Culture, BAL-quantitative w Gram Stain     Status: Abnormal   Collection Time: 03/30/22 12:04 PM   Specimen: Bronchoalveolar Lavage; Respiratory  Result Value Ref Range Status   Specimen Description BRONCHIAL ALVEOLAR LAVAGE  Final   Special Requests RUL  Final   Gram Stain   Final    RARE WBC PRESENT,BOTH PMN AND MONONUCLEAR RARE BUDDING YEAST SEEN Performed at [facility], [place] [address] [place], [place] [address]    Culture (A)  Final    >=100,000 COLONIES/mL CANDIDA GLABRATA 80,000 COLONIES/mL CANDIDA ALBICANS    Report Status 04/05/2022 FINAL  Final  Pneumocystis smear by DFA     Status: None   Collection Time: 03/30/22  2:17 PM   Specimen: Bronchoalveolar Lavage; Respiratory  Result Value Ref Range Status   Specimen Source-PJSRC RUL  Final    Comment: BRONCHIAL ALVEOLAR LAVAGE   Pneumocystis jiroveci Ag *See Scanned Report*  Final    Comment: Performed at [facility], [place] [address], [place], [place] [address]  Culture, blood (Routine X 2) w Reflex to ID Panel     Status: None (Preliminary result)   Collection Time: 04/03/22  9:10 AM   Specimen: BLOOD  Result Value Ref Range Status   Specimen Description BLOOD BLOOD LEFT HAND  Final   Special Requests   Final    BOTTLES DRAWN AEROBIC AND ANAEROBIC Blood Culture adequate volume   Culture   Final    NO GROWTH 2 DAYS Performed at [facility], [place] [address]  [place], [place] [address]  Report Status PENDING  Incomplete  Culture, blood (Routine X 2) w Reflex to ID Panel     Status: None (Preliminary result)   Collection Time: 04/03/22  9:19 AM   Specimen: BLOOD  Result Value Ref Range Status   Specimen Description BLOOD BLOOD LEFT HAND  Final   Special Requests   Final    BOTTLES DRAWN AEROBIC AND ANAEROBIC Blood Culture adequate volume   Culture   Final    NO GROWTH 2 DAYS Performed at [facility], [address], [place], [place] [address]    Report Status PENDING  Incomplete  Urine Culture     Status: None   Collection Time: 04/03/22  1:09 PM   Specimen: Urine, Clean Catch  Result Value Ref Range Status   Specimen Description URINE, CLEAN CATCH  Final   Special Requests NONE  Final   Culture   Final    NO GROWTH Performed at [facility], [place] [address] [place], [place] [address]    Report Status 04/04/2022 FINAL  Final     Labs: BNP (last 3 results) Recent Labs    03/31/22 0751 04/01/22 0345 04/02/22 0415  BNP 41.9 52.3 61.9   Basic Metabolic Panel: Recent Labs  Lab 03/31/22 0751 04/01/22 0345 04/02/22 0415 04/04/22 0430 04/05/22 0405  NA 142 141 138 140 136  K 3.5 4.0 3.4* 3.9 3.7  CL 111 111 106 110 109  CO2 _0 GLUCOSE 145* 192* 110* 129* 142*  BUN _1 CREATININE 0.76 0.81 0.89 0.82 0.89  CALCIUM 8.8* 8.6* 8.6* 8.9 8.2*  MG 2.4 2.2 2.0 2.1 2.0   Liver Function Tests: Recent Labs  Lab 03/31/22 0751 04/01/22 0345 04/02/22 0415 04/04/22 0430 04/05/22 0405  AST _2 ALT _3 33 31  ALKPHOS 73 78 74 70 68  BILITOT 0.3 0.4 0.5 0.5 0.5  PROT 5.3* 5.3* 5.2* 5.7* 5.2*  ALBUMIN 2.6* 2.6* 2.6* 2.7* 2.5*   No results for input(s): "LIPASE", "AMYLASE" in the last 168 hours. No results for input(s): "AMMONIA" in the last 168 hours. CBC: Recent Labs  Lab 03/31/22 0751 04/01/22 0345 04/02/22 0415 04/03/22 0910 04/04/22 0430 04/05/22 0405  WBC 6.3 7.7  9.0 10.1 8.7 7.6  NEUTROABS 3.7 5.1 6.0  --  5.7 4.9  HGB 9.7* 9.8* 10.3* 11.4* 10.8* 10.0*  HCT 33.4* 32.4* 33.5* 38.6 35.4* 32.9*  MCV 86.8 86.4 84.6 84.8 85.3 85.7  PLT 310 314 307 320 302 268   Cardiac Enzymes: No results for input(s): "CKTOTAL", "CKMB", "CKMBINDEX", "TROPONINI" in the last 168 hours. BNP: Invalid input(s): "POCBNP" CBG: Recent Labs  Lab 04/04/22 1214 04/04/22 1612 04/04/22 2120 04/05/22 0609 04/05/22 1142  GLUCAP 359* 171* 215* 109* 185*   D-Dimer No results for input(s): "DDIMER" in the last 72 hours. Hgb A1c No results for input(s): "HGBA1C" in the last 72 hours. Lipid Profile No results for input(s): "CHOL", "HDL", "LDLCALC", "TRIG", "CHOLHDL", "LDLDIRECT" in the last 72 hours. Thyroid function studies No results for input(s): "TSH", "T4TOTAL", "T3FREE", "THYROIDAB" in the last 72 hours.  Invalid input(s): "FREET3" Anemia work up No results for input(s): "VITAMINB12", "FOLATE", "FERRITIN", "TIBC", "IRON", "RETICCTPCT" in the last 72 hours. Urinalysis    Component Value Date/Time   COLORURINE YELLOW 04/03/2022 1118   APPEARANCEUR HAZY (A) 04/03/2022 1118   LABSPEC 1.024 04/03/2022 1118   PHURINE 5.0 04/03/2022 1118   GLUCOSEU  NEGATIVE 04/03/2022 1118   HGBUR SMALL (A) 04/03/2022 1118   BILIRUBINUR NEGATIVE 04/03/2022 1118   KETONESUR NEGATIVE 04/03/2022 1118   PROTEINUR 100 (A) 04/03/2022 1118   UROBILINOGEN 0.2 05/30/2013 1112   NITRITE NEGATIVE 04/03/2022 1118   LEUKOCYTESUR NEGATIVE 04/03/2022 1118   Sepsis Labs Recent Labs  Lab 04/02/22 0415 04/03/22 0910 04/04/22 0430 04/05/22 0405  WBC 9.0 10.1 8.7 7.6   Microbiology Recent Results (from the past 240 hour(s))  Urine Culture     Status: Abnormal   Collection Time: 03/26/22 12:14 PM   Specimen: Urine, Catheterized  Result Value Ref Range Status   Specimen Description   Final    URINE, CATHETERIZED Performed at [facility], [address] [place], [place] [address]     Special Requests   Final    NONE Performed at [facility], [address], [place], [place] [address]    Culture (A)  Final    40,000 COLONIES/mL ESCHERICHIA COLI Two isolates with different morphologies were identified as the same organism.The most resistant organism was reported. Performed at [facility], [place] [address], [place], [place] [address]    Report Status 03/29/2022 FINAL  Final   Organism ID, Bacteria ESCHERICHIA COLI (A)  Final      Susceptibility   Escherichia coli - MIC*    AMPICILLIN >=32 RESISTANT Resistant     CEFAZOLIN >=64 RESISTANT Resistant     CEFEPIME <=0.12 SENSITIVE Sensitive     CEFTRIAXONE 32 RESISTANT Resistant     CIPROFLOXACIN >=4 RESISTANT Resistant     GENTAMICIN <=1 SENSITIVE Sensitive     IMIPENEM Value in next row Sensitive      SENSITIVE1    NITROFURANTOIN Value in next row Sensitive      SENSITIVE1    TRIMETH/SULFA Value in next row Resistant      SENSITIVE1    AMPICILLIN/SULBACTAM Value in next row Resistant      SENSITIVE1    PIP/TAZO Value in next row Resistant      SENSITIVE1    * 40,000 COLONIES/mL ESCHERICHIA COLI  Resp Panel by RT-PCR (Flu A&B, Covid) Anterior Nasal Swab     Status: None   Collection Time: 03/26/22 12:19 PM   Specimen: Anterior Nasal Swab  Result Value Ref Range Status   SARS Coronavirus 2 by RT PCR NEGATIVE NEGATIVE Final    Comment: (NOTE) SARS-CoV-2 target nucleic acids are NOT DETECTED.  The SARS-CoV-2 RNA is generally detectable in upper respiratory specimens during the acute phase of infection. The lowest concentration of SARS-CoV-2 viral copies this assay can detect is 138 copies/mL. A negative result does not preclude SARS-Cov-2 infection and should not be used as the sole basis for treatment or other patient management decisions. A negative result may occur with  improper specimen collection/handling, submission of specimen other than nasopharyngeal swab, presence of viral mutation(s) within  the areas targeted by this assay, and inadequate number of viral copies(<138 copies/mL). A negative result must be combined with clinical observations, patient history, and epidemiological information. The expected result is Negative.  Fact Sheet for Patients:  EntrepreneurPulse.com.au  Fact Sheet for Healthcare Providers:  IncredibleEmployment.be  This test is no t yet approved or cleared by the [place] FDA and  has been authorized for detection and/or diagnosis of SARS-CoV-2 by FDA under an Emergency Use Authorization (EUA). This EUA will remain  in effect (meaning this test can be used) for the duration of the COVID-19 declaration under Section 564(b)(1) of  the Act, 21 U.S.C.section 360bbb-3(b)(1), unless the authorization is terminated  or revoked sooner.       Influenza A by PCR NEGATIVE NEGATIVE Final   Influenza B by PCR NEGATIVE NEGATIVE Final    Comment: (NOTE) The Xpert Xpress SARS-CoV-2/FLU/RSV plus assay is intended as an aid in the diagnosis of influenza from Nasopharyngeal swab specimens and should not be used as a sole basis for treatment. Nasal washings and aspirates are unacceptable for Xpert Xpress SARS-CoV-2/FLU/RSV testing.  Fact Sheet for Patients: EntrepreneurPulse.com.au  Fact Sheet for Healthcare Providers: IncredibleEmployment.be  This test is not yet approved or cleared by the [place] FDA and has been authorized for detection and/or diagnosis of SARS-CoV-2 by FDA under an Emergency Use Authorization (EUA). This EUA will remain in effect (meaning this test can be used) for the duration of the COVID-19 declaration under Section 564(b)(1) of the Act, 21 U.S.C. section 360bbb-3(b)(1), unless the authorization is terminated or revoked.  Performed at [facility], [address], [place], [place] [address]   MRSA Next Gen by PCR, Nasal     Status: None   Collection  Time: 03/26/22  2:10 PM   Specimen: Nasal Mucosa; Nasal Swab  Result Value Ref Range Status   MRSA by PCR Next Gen NOT DETECTED NOT DETECTED Final    Comment: (NOTE) The GeneXpert MRSA Assay (FDA approved for NASAL specimens only), is one component of a comprehensive MRSA colonization surveillance program. It is not intended to diagnose MRSA infection nor to guide or monitor treatment for MRSA infections. Test performance is not FDA approved in patients less than 69 years old. Performed at [facility], [address], [place], [place] [address]   MRSA Next Gen by PCR, Nasal     Status: None   Collection Time: 03/29/22  6:34 AM   Specimen: Nasal Mucosa; Nasal Swab  Result Value Ref Range Status   MRSA by PCR Next Gen NOT DETECTED NOT DETECTED Final    Comment: (NOTE) The GeneXpert MRSA Assay (FDA approved for NASAL specimens only), is one component of a comprehensive MRSA colonization surveillance program. It is not intended to diagnose MRSA infection nor to guide or monitor treatment for MRSA infections. Test performance is not FDA approved in patients less than 32 years old. Performed at [facility], [place] [address] [place], [place] [address]   Acid Fast Smear (AFB)     Status: None   Collection Time: 03/30/22  8:42 AM   Specimen: Bronchoalveolar Lavage  Result Value Ref Range Status   AFB Specimen Processing Concentration  Final   Acid Fast Smear Negative  Final    Comment: (NOTE) Performed At: [facility] [place], [place] [id number] Rush Farmer MD [id number]    Source (AFB) BAL RUL  Final    Comment: Performed at [facility], [place] [address], [place], [place] [address]  Fungus Culture With Stain     Status: Abnormal (Preliminary result)   Collection Time: 03/30/22  8:42 AM   Specimen: Bronchial Alveolar Lavage  Result Value Ref Range Status   Fungus Stain Final report (A)  Final    Comment: (NOTE) Performed At: [facility] [place], [place] [id number] Rush Farmer MD OF:[id number]    Fungus (Mycology) Culture PENDING  Incomplete   Fungal Source BAL RUL  Final    Comment: Performed at [facility], [place] [address], [place], [place] [address]  Aerobic Culture w Gram  Stain (superficial specimen)     Status: None (Preliminary result)   Collection Time: 03/30/22  8:42 AM   Specimen: Tissue; Lung  Result Value Ref Range Status   Specimen Description TISSUE  Final   Special Requests Immunocompromised  Final   Gram Stain NO WBC SEEN NO ORGANISMS SEEN   Final   Culture   Final    CULTURE REINCUBATED FOR BETTER GROWTH Performed at [facility], [address], [place], [place] [address]    Report Status PENDING  Incomplete  Acid Fast Smear (AFB)     Status: None   Collection Time: 03/30/22  8:42 AM   Specimen: Lung; Tissue  Result Value Ref Range Status   AFB Specimen Processing Concentration  Final   Acid Fast Smear Negative  Final    Comment: (NOTE) Performed At: [facility] [place], [place] [id number] Rush Farmer MD [id number]    Source (AFB) TISSUE  Final    Comment: Performed at [facility], [place] [address] [place], [place] [address]  Fungus Culture Result     Status: Abnormal   Collection Time: 03/30/22  8:42 AM  Result Value Ref Range Status   Result 1 Yeast observed (A)  Final    Comment: (NOTE) Performed At: [facility] [place], [place] [id number] Rush Farmer MD [id number]   Culture, fungus without smear     Status: Abnormal (Preliminary result)   Collection Time: 03/30/22 12:00 PM   Specimen: Tissue; Other  Result Value Ref Range Status   Specimen Description TISSUE  Final   Special Requests RUL LUNG  Final   Culture (A)  Final    CANDIDA ALBICANS CONTINUING TO HOLD Performed at [facility], [place] [address] [place], [place] [address]    Report Status PENDING  Incomplete   Culture, BAL-quantitative w Gram Stain     Status: Abnormal   Collection Time: 03/30/22 12:04 PM   Specimen: Bronchoalveolar Lavage; Respiratory  Result Value Ref Range Status   Specimen Description BRONCHIAL ALVEOLAR LAVAGE  Final   Special Requests RUL  Final   Gram Stain   Final    RARE WBC PRESENT,BOTH PMN AND MONONUCLEAR RARE BUDDING YEAST SEEN Performed at [facility], [place] [address] [place], [place] [address]    Culture (A)  Final    >=100,000 COLONIES/mL CANDIDA GLABRATA 80,000 COLONIES/mL CANDIDA ALBICANS    Report Status 04/05/2022 FINAL  Final  Pneumocystis smear by DFA     Status: None   Collection Time: 03/30/22  2:17 PM   Specimen: Bronchoalveolar Lavage; Respiratory  Result Value Ref Range Status   Specimen Source-PJSRC RUL  Final    Comment: BRONCHIAL ALVEOLAR LAVAGE   Pneumocystis jiroveci Ag *See Scanned Report*  Final    Comment: Performed at [facility], [place] [address] [place], [place] [address]  Culture, blood (Routine X 2) w Reflex to ID Panel     Status: None (Preliminary result)   Collection Time: 04/03/22  9:10 AM   Specimen: BLOOD  Result Value Ref Range Status   Specimen Description BLOOD BLOOD LEFT HAND  Final   Special Requests   Final    BOTTLES DRAWN AEROBIC AND ANAEROBIC Blood Culture adequate volume   Culture   Final    NO GROWTH 2 DAYS Performed at [facility], [place] [address] [place], [place] [address]    Report Status PENDING  Incomplete  Culture,  blood (Routine X 2) w Reflex to ID Panel     Status: None (Preliminary result)   Collection Time: 04/03/22  9:19 AM   Specimen: BLOOD  Result Value Ref Range Status   Specimen Description BLOOD BLOOD LEFT HAND  Final   Special Requests   Final    BOTTLES DRAWN AEROBIC AND ANAEROBIC Blood Culture adequate volume   Culture   Final    NO GROWTH 2 DAYS Performed at [facility], [address] [place], [place] [address]    Report Status PENDING  Incomplete  Urine  Culture     Status: None   Collection Time: 04/03/22  1:09 PM   Specimen: Urine, Clean Catch  Result Value Ref Range Status   Specimen Description URINE, CLEAN CATCH  Final   Special Requests NONE  Final   Culture   Final    NO GROWTH Performed at [facility], [place] [address] [place], [place] [address]    Report Status 04/04/2022 FINAL  Final     Time coordinating discharge: 35 minutes  SIGNED:   Aline August, MD  [facility] 04/05/2022, 11:50 AM

## 2022-04-05 NOTE — Progress Notes (Signed)
[name] for Infectious Disease    Date of Admission:  03/26/2022   Total days of antibiotics 10          ID: [name] is a 56 y.o. female with   Principal Problem:   Severe sepsis ([place]) Active Problems:   Presence of intrathecal pump   Chemotherapy-induced peripheral neuropathy (HCC)   Secondary adrenal insufficiency (HCC)   Stage 3a chronic kidney disease (HCC)   Lobar pneumonia (HCC)   Granulomatosis with polyangiitis (HCC)   Class 2 obesity   UTI (urinary tract infection)   Opioid dependence (HCC)   Acute metabolic encephalopathy   Acute pulmonary embolus (HCC)    Subjective: Afebrile, denies dysuria, mild cough,but still significantly fatigued otherwise 12 point ros is negative  Medications:   amitriptyline  50 mg Oral QHS   apixaban  10 mg Oral BID   Followed by   Derrill Memo ON 04/07/2022] apixaban  5 mg Oral BID   budesonide (PULMICORT) nebulizer solution  0.5 mg Nebulization BID   Chlorhexidine Gluconate Cloth  6 each Topical Daily   DULoxetine  30 mg Oral Daily   folic acid  2 mg Oral Daily   gabapentin  600 mg Oral TID   insulin aspart  0-9 Units Subcutaneous TID WC   ipratropium-albuterol  3 mL Nebulization BID   linaclotide  145 mcg Oral QAC breakfast   pantoprazole  40 mg Oral Daily   predniSONE  20 mg Oral Q breakfast   rosuvastatin  10 mg Oral QHS   sodium chloride flush  10-40 mL Intracatheter Q12H   sulfamethoxazole-trimethoprim  1 tablet Oral Once per day on Mon Wed Fri   vitamin B-12  1,000 mcg Oral Daily    Objective: Vital signs in last 24 hours: Temp:  [98.1 F (36.7 C)-99 F (37.2 C)] 98.1 F (36.7 C) (06/14 1618) Pulse Rate:  [65-98] 90 (06/14 1618) Resp:  [16-18] 18 (06/14 1618) BP: (96-114)/(65-91) 114/79 (06/14 1618) SpO2:  [93 %-98 %] 98 % (06/14 1618)   Physical Exam  Constitutional:  oriented to person, place, and time. appears well-developed and well-nourished. No distress.  HENT: [place]/AT, PERRLA, no scleral  icterus Mouth/Throat: Oropharynx is clear and moist. No oropharyngeal exudate.  Cardiovascular: Normal rate, regular rhythm and normal heart sounds. Exam reveals no gallop and no friction rub.  No murmur heard.  Pulmonary/Chest: Effort normal and breath sounds normal. No respiratory distress.  has no wheezes.  Neck = supple, no nuchal rigidity Abdominal: Soft. Bowel sounds are normal.  exhibits no distension. There is no tenderness.  ZOX:WRUEA arm picc line is c/d/i Skin: Skin is warm and dry. No rash noted. No erythema.  Psychiatric: a normal mood and affect.  behavior is normal.    Lab Results Recent Labs    04/04/22 0430 04/05/22 0405  WBC 8.7 7.6  HGB 10.8* 10.0*  HCT 35.4* 32.9*  NA 140 136  K 3.9 3.7  CL 110 109  CO2 22 23  BUN 18 17  CREATININE 0.82 0.89   Liver Panel Recent Labs    04/04/22 0430 04/05/22 0405  PROT 5.7* 5.2*  ALBUMIN 2.7* 2.5*  AST 27 19  ALT 33 31  ALKPHOS 70 23  BILITOT 0.5 0.5    Microbiology: Reviewed: Tissue- c.albicans Bal- c.albicans and c.glabrata Fungitell negative Aerobic cx pending Studies/Results: Cxr: IMPRESSION: Interval improvement in bilateral airspace disease, with mild residual opacities in the right lung.  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 Will need to follow up with pcp/rheum to taper down steroids back to her baseline as she tolerates. This episode of pneumonia not thought to be a flare given recent rituximab infusion  Pneumonia = treated for HCAP with vanco (14 days) to finish on 6/17 and meropenem (7 days). Underwent bronch while on her 4th day of broad spectrum abtx. 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 Cx for afb, fungal aerobic are pending. Though candida albicans found in tissue and BAL cx --chest CT did not appear c/w fungal pneumonia. In  addition, fungitell negative. Will follow up on aerobic culture. Also she is improving without fungal coverage (clinically and radiograph)  Uti = clinically c/w uti, cx pending, had low level R-ecoli on admit. Restarted on cefepime, day 3 - can finish course with 4 addn days of nitrofurantoin(s)  Will follow up on cx and have appt on 6/19 with patient  [facility] Pager: [phone]  04/05/2022, 6:02 PM

## 2022-04-05 NOTE — Progress Notes (Signed)
Cm has provided her with 30 day and $10 co pay cards for Eliquis.

## 2022-04-05 NOTE — TOC Transition Note (Signed)
Transition of Care (TOC) - CM/SW Discharge Note Marvetta Gibbons RN, BSN Transitions of Care Unit 4E- RN Case Manager See Treatment Team for direct phone #    Patient Details  Name: [name] MRN: [id number] Date of Birth: 06-24-1966  Transition of Care [facility]) CM/SW Contact:  Dawayne Patricia, RN Phone Number: 04/05/2022, 2:34 PM   Clinical Narrative:    Pt pending discharge later today, CM has spoken with Jeannene Patella and Tommi Rumps for [facility] and Home IV abx infusion needs- Per Pam with home infusion she plans to come later this afternoon to provide bedside education with pt and husband prior to discharge. Pt will need to receive this afternoons doing for both Vanc and Cefepime prior to discharging- [facility] pharmacy will plan to do start of care tonight, and Alvis Lemmings will do [place] start of care tomorrow. ID following and will see pt this am for final IV abx OPAT for discharge.   [place] orders in place and set up with [facility].   Spouse to come later for education and provide transportation home.    Final next level of care: [place] Barriers to Discharge: Barriers Resolved   Patient Goals and CMS Choice Patient states their goals for this hospitalization and ongoing recovery are:: return home CMS Medicare.gov Compare Post Acute Care list provided to:: Patient Choice offered to / list presented to : Patient  Discharge Placement             Home w/ [facility]          Discharge Plan and Services In-house Referral: Clinical Social Work Discharge Planning Services: CM Consult Post Acute Care Choice: Home Health, Durable Medical Equipment          DME Arranged: Walker rolling (pt confirmed she already has one) DME Agency: NA       HH Arranged: RN, PT, Nurse's Aide, Speech Therapy HH Agency: [place] Date [facility] Agency Contacted: 03/31/22 Time [place]: [id number] Representative spoke with at [place]: [place] ([place])  Interventions     Readmission Risk Interventions    04/05/2022    2:33 PM 03/27/2022   10:12 AM 02/20/2022    1:49 PM  Readmission Risk Prevention Plan  Transportation Screening Complete Complete Complete  HRI or Home Care Consult   Complete  Social Work Consult for [place] Planning/Counseling   Complete  Palliative Care Screening   Not Applicable  Medication Review Press photographer) Complete Complete Complete  PCP or Specialist appointment within 3-5 days of discharge Complete    HRI or [place] Complete Complete   SW Recovery Care/Counseling Consult Complete Complete   Palliative Care Screening Not Applicable Not [place] Not Applicable Not Applicable

## 2022-04-06 LAB — AEROBIC CULTURE W GRAM STAIN (SUPERFICIAL SPECIMEN): Gram Stain: NONE SEEN

## 2022-04-07 ENCOUNTER — Ambulatory Visit: Payer: BC Managed Care – PPO | Admitting: Family Medicine

## 2022-04-07 ENCOUNTER — Encounter: Payer: Self-pay | Admitting: Family Medicine

## 2022-04-07 VITALS — BP 114/72 | HR 68 | Ht 64.0 in | Wt 195.0 lb

## 2022-04-07 DIAGNOSIS — F11288 Opioid dependence with other opioid-induced disorder: Secondary | ICD-10-CM | POA: Diagnosis not present

## 2022-04-07 DIAGNOSIS — M313 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: Secondary | ICD-10-CM

## 2022-04-07 DIAGNOSIS — G620 Drug-induced polyneuropathy: Secondary | ICD-10-CM | POA: Diagnosis not present

## 2022-04-07 DIAGNOSIS — A419 Sepsis, unspecified organism: Secondary | ICD-10-CM | POA: Diagnosis not present

## 2022-04-07 DIAGNOSIS — I2699 Other pulmonary embolism without acute cor pulmonale: Secondary | ICD-10-CM

## 2022-04-07 DIAGNOSIS — Z978 Presence of other specified devices: Secondary | ICD-10-CM

## 2022-04-07 DIAGNOSIS — N390 Urinary tract infection, site not specified: Secondary | ICD-10-CM

## 2022-04-07 DIAGNOSIS — Z1231 Encounter for screening mammogram for malignant neoplasm of breast: Secondary | ICD-10-CM

## 2022-04-07 DIAGNOSIS — R6520 Severe sepsis without septic shock: Secondary | ICD-10-CM

## 2022-04-07 DIAGNOSIS — J181 Lobar pneumonia, unspecified organism: Secondary | ICD-10-CM

## 2022-04-07 LAB — PROCALCITONIN

## 2022-04-07 NOTE — Progress Notes (Unsigned)
New Patient Office Visit  Subjective:  Patient ID: [name], female    DOB: 1966-05-04  Age: 56 y.o. MRN: [id number]  CC:  Chief Complaint  Patient presents with   New Patient (Initial Visit)    Pt establishing care, c/o of lightheadedness and dizzy spells.  Pap smear due pt would like to come back for another visit.     HPI [name] is a 56 y.o. female with past medical history of essential hypertension, acute pulmonary embolus, and lobar pneumonia presents for establishing care. She was seen in the hospital on 03/26/22 for severe sepsis secondary to pneumonia and UTI.CT of the brain was negative for any acute findings.  Chest x-ray showed bilateral infiltrates in the left upper lobe, right upper lobe, and right middle lobe. She was also found to have acute pulmonary embolism and currently on  Eliquis.The patient was started on vancomycin and cefepime. Following her discharge from the hospital, she reports feeling well and continuing her treatment with Vancomycin and macrobid. She reports not receiving her dose of Vancomycin on 04/06/22 and 04/07/22 because of the IV in her right arm. Reports that she will share the information with the nurse to see if she can receive another treatment dose. She reports following up with her rheumatologist with [facility] and her pain clinic with [facility] for opioid dependence and granulomatosis with polyangiitis. She shares that she would [facility] and would like a referral to a rheumatologist and pain clinic within the [facility] to manage her chronic conditions.    Past Medical History:  Diagnosis Date   Chronic cough    Chronic pain    Diabetes mellitus without complication (HCC)    Dyspnea    GERD (gastroesophageal reflux disease)    Hypokalemia    Hypothyroidism    Left wrist pain 06/01/2021   Myonecrosis (HCC) 07/01/2021   Neurocardiogenic syncope    OSA (obstructive sleep apnea)    does not use CPAP   Osteomyelitis  (HCC)    bilateral feet   Peripheral neuropathy    Presence of intrathecal pump    recieves Prialt/bupivicaine   Tear of gluteus medius tendon 07/01/2021   Wegener's granulomatosis 2009   Wegner's disease (congenital syphilitic osteochondritis)     Past Surgical History:  Procedure Laterality Date   AMPUTATION Right 10/21/2020   Procedure: Right hallux amputation;  Surgeon: Toni Arthurs, MD;  Location: [facility];  Service: Orthopedics;  Laterality: Right;   AMPUTATION TOE Bilateral 08/12/2020   Procedure: Right 3rd toe amputation; left hallux and 3rd toe amputation;  Surgeon: Toni Arthurs, MD;  Location: [facility];  Service: Orthopedics;  Laterality: Bilateral;    AMPUTATION TOE Right 03/24/2021   Procedure: AMPUTATION TOE;  Surgeon: Toni Arthurs, MD;  Location: [facility];  Service: Orthopedics;  Laterality: Right;   APPENDECTOMY     BACK SURGERY     BIOPSY  03/30/2022   Procedure: BIOPSY;  Surgeon: Tomma Lightning, MD;  Location: [facility];  Service: Pulmonary;;   BRONCHIAL WASHINGS  03/30/2022   Procedure: BRONCHIAL WASHINGS;  Surgeon: Tomma Lightning, MD;  Location: [facility];  Service: Pulmonary;;   CHOLECYSTECTOMY     COLONOSCOPY N/A 06/02/2013   Procedure: COLONOSCOPY;  Surgeon: Graylin Shiver, MD;  Location: [facility];  Service: Endoscopy;  Laterality: N/A;   HERNIA REPAIR  2000   INCISION AND DRAINAGE ABSCESS Left 07/07/2016   Procedure: DEBRIDMENT LEFT THIGH  ABSCESS, EXISION ACUTE SKIN RASH LEFT THIGH(1CM LESION);  Surgeon: Fanny Skates, MD;  Location: [facility];  Service: General;  Laterality: Left;   SEPTOPLASTY  02/2013   spleen anuyism     splenic aneurysm     TRANSMETATARSAL AMPUTATION Bilateral 10/21/2020   Procedure: Left transmetatarsal amputation;  Surgeon: Wylene Simmer, MD;  Location: [facility];  Service: Orthopedics;  Laterality: Bilateral;   TRANSMETATARSAL AMPUTATION Left 03/24/2021   Procedure:  TRANSMETATARSAL AMPUTATION;  Surgeon: Wylene Simmer, MD;  Location: [facility];  Service: Orthopedics;  Laterality: Left;   VAGINAL HYSTERECTOMY     VIDEO BRONCHOSCOPY Right 03/30/2022   Procedure: VIDEO BRONCHOSCOPY WITH FLUORO;  Surgeon: Laurin Coder, MD;  Location: [facility];  Service: Pulmonary;  Laterality: Right;  atypical pneumonia    Family History  Problem Relation Age of Onset   Diabetes Mother    Heart disease Mother    Diabetes Father     Social History   Socioeconomic History   Marital status: Married    Spouse name: Not on file   Number of children: Not on file   Years of education: Not on file   Highest education level: Not on file  Occupational History   Not on file  Tobacco Use   Smoking status: Never   Smokeless tobacco: Never  Vaping Use   Vaping Use: Never used  Substance and Sexual Activity   Alcohol use: Yes    Comment: occasionally   Drug use: No   Sexual activity: Yes    Birth control/protection: Surgical  Other Topics Concern   Not on file  Social History Narrative   Not on file   Social Determinants of Health   Financial Resource Strain: Not on file  Food Insecurity: Not on file  Transportation Needs: Not on file  Physical Activity: Not on file  Stress: Not on file  Social Connections: Not on file  Intimate Partner Violence: Not on file    ROS Review of Systems  Constitutional:  Positive for diaphoresis. Negative for chills.  HENT:  Negative for postnasal drip, rhinorrhea and sinus pain.   Eyes:  Negative for photophobia, pain and redness.  Respiratory:  Positive for cough and shortness of breath. Negative for chest tightness and wheezing.   Cardiovascular:  Negative for chest pain and palpitations.  Gastrointestinal:  Positive for nausea. Negative for constipation, diarrhea and vomiting.  Genitourinary:  Positive for dysuria and urgency.  Skin:  Negative for rash and wound.  Neurological:  Positive for dizziness (only if she  stands up quickly).  Hematological:  Bruises/bleeds easily.    Objective:   Today's Vitals: BP 114/72   Pulse 68   Ht $R'5\' 4"'Ai$  (1.626 m)   Wt 195 lb (88.5 kg)   SpO2 98%   BMI 33.47 kg/m   Physical Exam HENT:     Head: Normocephalic.     Right Ear: External ear normal.     Left Ear: External ear normal.     Nose: No congestion.     Mouth/Throat:     Mouth: Mucous membranes are moist.  Eyes:     Extraocular Movements: Extraocular movements intact.     Pupils: Pupils are equal, round, and reactive to light.  Cardiovascular:     Rate and Rhythm: Normal rate and regular rhythm.     Pulses: Normal pulses.     Heart sounds: Normal heart sounds.  Pulmonary:     Effort: Pulmonary effort is normal.  Breath sounds: Normal breath sounds.  Abdominal:     Palpations: Abdomen is soft.  Musculoskeletal:     Cervical back: No rigidity.     Right lower leg: No edema.     Left lower leg: No edema.  Skin:    Findings: Bruising present.  Neurological:     Mental Status: She is alert and oriented to person, place, and time.  Psychiatric:     Comments: Normal affect     Assessment & Plan:   Problem List Items Addressed This Visit       Cardiovascular and Mediastinum   Granulomatosis with polyangiitis [facility])    -Patient follows up with her rheumatologist with [facility] but wants to stay within the [facility] system. -Referral placed      Relevant Orders   Ambulatory referral to Rheumatology   Acute pulmonary embolus [facility])    She is currently on Eliquis Encouraged to continue treatment        Respiratory   Lobar pneumonia ([place])   Relevant Orders   DG Chest 2 View     Nervous and Auditory   Chemotherapy-induced peripheral neuropathy [facility])   Relevant Orders   Ambulatory referral to Rheumatology     Genitourinary   UTI (urinary tract infection)    C/o of dysuria and urgency Taking macrobid Encouraged to continue treatment        Other   Presence of  intrathecal pump (Chronic)    -The patient has chronic pain and has an intrathecal pump infusing bupivacaine/ziconitide which was recently refilled on 03/02/2022 at [facility] pain clinic. -She states that the settings have not been changed. -She also states that she does not take more opioid medications than prescribed. -There is been no history of headaches, tonic-clonic activity, or syncope. -Referral made to pain clinic      Severe sepsis ([place]) - Primary    -She reports not receiving her dose of Vancomycin on 04/06/22 and 04/07/22 because of the IV in her right arm. Reports that she will share the information with the nurse to see if she can receive another treatment dose - following up with infectious disease next week       Relevant Orders   Microalbumin / creatinine urine ratio   CBC   Basic Metabolic Panel (BMET)   Opioid dependence ([place])    -The patient has chronic pain and has an intrathecal pump infusing bupivacaine/ziconitide which was recently refilled on 03/02/2022 at [facility] pain clinic. -she states that the settings have not been changed.   -she also states that she does not take more opioid medications than prescribed.   -There is been no history of headaches, tonic-clonic activity, or syncope. -referral made to pain clinic for  Further management     Referral made to pain clinic       Relevant Orders   Ambulatory referral to Pain Clinic   Other Visit Diagnoses     Breast cancer screening by mammogram       Relevant Orders   MM 3D SCREEN BREAST BILATERAL       Outpatient Encounter Medications as of 04/07/2022  Medication Sig   acetaminophen (TYLENOL) 325 MG tablet Take 2 tablets (650 mg total) by mouth every 6 (six) hours as needed for mild pain (or Fever >/= 101).   albuterol (PROVENTIL HFA;VENTOLIN HFA) 108 (90 Base) MCG/ACT inhaler Inhale 2 puffs into the lungs every 6 (six) hours as needed for wheezing or shortness of breath.   amitriptyline (  ELAVIL) 50 MG tablet  Take 50 mg by mouth at bedtime.   apixaban (ELIQUIS) 5 MG TABS tablet $RemoveBef'10mg'LbZDsQylkX$  BID till 04/06/22 then 5 mg BID from 04/07/22   Calcium Carb-Cholecalciferol (CALCIUM CARBONATE-VITAMIN D3 PO) Take 1 tablet by mouth daily.   DULoxetine (CYMBALTA) 60 MG capsule Take 60 mg by mouth daily.   folic acid (FOLVITE) 1 MG tablet Take 2 mg by mouth daily.   gabapentin (NEURONTIN) 300 MG capsule Take 2 capsules (600 mg total) by mouth 3 (three) times daily.   lidocaine (LIDODERM) 5 % Place 1 patch onto the skin daily as needed (for pain). Remove & Discard patch within 12 hours or as directed by MD   linaclotide (LINZESS) 145 MCG CAPS capsule Take 145 mcg by mouth daily before breakfast.   magnesium oxide (MAG-OX) 400 MG tablet magnesium oxide 400 mg (241.3 mg magnesium) tablet   400 mg by oral route.   Melatonin 10 MG TABS Take 10 mg by mouth at bedtime as needed (sleep).   metFORMIN (GLUCOPHAGE-XR) 500 MG 24 hr tablet Take 500 mg by mouth daily with breakfast.   methocarbamol (ROBAXIN) 500 MG tablet Take 500 mg by mouth every 6 (six) hours as needed for muscle spasms.   methotrexate 50 MG/2ML injection methotrexate sodium 25 mg/mL injection solution   METHOTREXATE SODIUM IJ Inject 25 mg into the skin every Sunday.   naloxone (NARCAN) nasal spray 4 mg/0.1 mL Place 0.4 mg into the nose once.   nitrofurantoin, macrocrystal-monohydrate, (MACROBID) 100 MG capsule Take 1 capsule (100 mg total) by mouth 2 (two) times daily for 4 days.   pantoprazole (PROTONIX) 40 MG tablet Take 40 mg by mouth daily.   Potassium Chloride Crys ER (K-DUR PO) Take 40 mEq by mouth every other day.   predniSONE (DELTASONE) 10 MG tablet Take 1 tab ($Remo'10mg'nYARH$ ) daily for 2 days, then reduce to your home dose of $Remov'5mg'kYhScf$  daily   rosuvastatin (CRESTOR) 10 MG tablet Take 10 mg by mouth at bedtime.   sulfamethoxazole-trimethoprim (BACTRIM) 400-80 MG tablet Take 1 tablet by mouth 3 (three) times a week. Monday,Wednesday and Friday   tapentadol HCl (NUCYNTA)  75 MG tablet Take 75 mg by mouth. Take 1 tablet in the morning, 1 tablet in the afternoon and 2 tablets at bedtime   topiramate (TOPAMAX) 100 MG tablet Take 100 mg by mouth daily.   [EXPIRED] vancomycin IVPB Inject 1,250 mg into the vein daily for 3 days. Indication:  pneumonia First Dose: Yes Last Day of Therapy:  04/08/2022 Labs - Sunday/Monday:  CBC/D, BMP, and vancomycin trough. Labs - Thursday:  BMP and vancomycin trough Labs - Every other week:  ESR and CRP Method of administration: Elastomeric Method of administration may be changed at the discretion of the patient and/or caregiver's ability to self-administer the medication ordered.   vitamin B-12 (CYANOCOBALAMIN) 1000 MCG tablet Take 1,000 mcg by mouth daily.   Vitamin D, Ergocalciferol, (DRISDOL) 50000 units CAPS capsule Take 50,000 Units by mouth every Friday.   senna (SENOKOT) 8.6 MG TABS tablet Take 2 tablets (17.2 mg total) by mouth 2 (two) times daily. (Patient not taking: Reported on 04/07/2022)   No facility-administered encounter medications on file as of 04/07/2022.    Follow-up: No follow-ups on file.   Alvira Monday, [name]

## 2022-04-07 NOTE — Patient Instructions (Addendum)
I appreciate the opportunity to provide care to you today!    Follow up:  3 months  Labs: please stop by the lab anytime to get your blood drawn (CBC, BMP)  Referral: Pain clinic, Rheumatology  Please stop by [facility] to get an X-ray  of your lungs the week of 05/15/22 to assess for clearing of your pneumonia   Please stop by [facility] to get your mammogram   Please continue to a heart-healthy diet and increase your physical activities. Try to exercise for at least three times a week.      It was a pleasure to see you and I look forward to continuing to work together on your health and well-being. Please do not hesitate to call the office if you need care or have questions about your care.   Have a wonderful day and week. With Gratitude, [name] MSN, [name]-BC

## 2022-04-07 NOTE — Progress Notes (Unsigned)
New p

## 2022-04-08 LAB — CULTURE, BLOOD (ROUTINE X 2)
Culture: NO GROWTH
Culture: NO GROWTH
Special Requests: ADEQUATE
Special Requests: ADEQUATE

## 2022-04-09 NOTE — Assessment & Plan Note (Addendum)
-  She reports not receiving her dose of Vancomycin on 04/06/22 and 04/07/22 because of the IV in her right arm. Reports that she will share the information with the nurse to see if she can receive another treatment dose - following up with infectious disease next week

## 2022-04-09 NOTE — Assessment & Plan Note (Addendum)
-  The patient has chronic pain and has an intrathecal pump infusing bupivacaine/ziconitide which was recently refilled on 03/02/2022 at [facility] pain clinic. -she states that the settings have not been changed.   -she also states that she does not take more opioid medications than prescribed.   -There is been no history of headaches, tonic-clonic activity, or syncope. -referral made to pain clinic for  Further management     Referral made to pain clinic

## 2022-04-09 NOTE — Assessment & Plan Note (Signed)
C/o of dysuria and urgency Taking macrobid Encouraged to continue treatment

## 2022-04-09 NOTE — Assessment & Plan Note (Signed)
She is currently on Eliquis Encouraged to continue treatment

## 2022-04-09 NOTE — Assessment & Plan Note (Signed)
-  The patient has chronic pain and has an intrathecal pump infusing bupivacaine/ziconitide which was recently refilled on 03/02/2022 at [facility] pain clinic. -She states that the settings have not been changed. -She also states that she does not take more opioid medications than prescribed. -There is been no history of headaches, tonic-clonic activity, or syncope. -Referral made to pain clinic

## 2022-04-09 NOTE — Assessment & Plan Note (Signed)
-  Patient follows up with her rheumatologist with [facility] but wants to stay within the [facility] system. -Referral placed

## 2022-04-10 ENCOUNTER — Telehealth: Payer: Self-pay | Admitting: Pharmacist

## 2022-04-10 NOTE — Telephone Encounter (Signed)
April from [facility] called regarding patient's end date for her vancomycin. End date is 6/19 but orders were to leave PICC in until patient is seen in clinic. Gave verbal to extend until 6/22 when patient has a follow up appointment scheduled with Dr. Orvan Falconer.   Dacia Capers L. Kaleth Koy, PharmD, BCIDP, AAHIVP, CPP Clinical Pharmacist Practitioner Infectious Diseases Clinical Pharmacist [facility] 04/10/2022, 4:12 PM

## 2022-04-12 ENCOUNTER — Ambulatory Visit (INDEPENDENT_AMBULATORY_CARE_PROVIDER_SITE_OTHER): Payer: BC Managed Care – PPO | Admitting: Student

## 2022-04-12 ENCOUNTER — Inpatient Hospital Stay: Payer: BC Managed Care – PPO | Admitting: Pulmonary Disease

## 2022-04-12 ENCOUNTER — Encounter: Payer: Self-pay | Admitting: Student

## 2022-04-12 ENCOUNTER — Telehealth: Payer: Self-pay | Admitting: Gastroenterology

## 2022-04-12 VITALS — BP 112/78 | HR 72 | Temp 98.3°F | Ht 66.0 in | Wt 199.6 lb

## 2022-04-12 DIAGNOSIS — R490 Dysphonia: Secondary | ICD-10-CM

## 2022-04-12 DIAGNOSIS — J189 Pneumonia, unspecified organism: Secondary | ICD-10-CM | POA: Insufficient documentation

## 2022-04-12 DIAGNOSIS — R1319 Other dysphagia: Secondary | ICD-10-CM

## 2022-04-12 DIAGNOSIS — M313 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: Secondary | ICD-10-CM | POA: Diagnosis not present

## 2022-04-12 MED ORDER — GUAIFENESIN ER 600 MG PO TB12: 600.0000 mg | ORAL_TABLET | Freq: Two times a day (BID) | ORAL | 3 refills | Status: DC

## 2022-04-12 MED ORDER — ALBUTEROL SULFATE HFA 108 (90 BASE) MCG/ACT IN AERS: 2.0000 | INHALATION_SPRAY | Freq: Four times a day (QID) | RESPIRATORY_TRACT | 11 refills | Status: DC | PRN

## 2022-04-12 NOTE — Telephone Encounter (Signed)
Patient left a voicemail stating that she hasn't had antibiotics since 6/16. I returned patient call - she stated that [facility] is sending out two more dose for today and tomorrow for patient to take. I advised patient I would let pharmacy team and Dr.Campbell know.  Maddux Vanscyoc Lesli Albee, CMA

## 2022-04-12 NOTE — Patient Instructions (Signed)
-   CT Chest at the end of July - PFTs (breathing tests) next visit in 6 weeks - A bunch of referrals: [facility] Rheumatology, ENT for hoarseness/breathy voice and prior GPA involvement, GI for dysphagia and prior stricture - stick with prednisone 5 mg daily, current methotrexate for now - Albuterol 2 puffs twice daily followed by flutter valve 10 slow but firm puffs twice daily  - mucinex

## 2022-04-12 NOTE — Progress Notes (Signed)
Synopsis: Referred for GPA, recent pneumonia by Chesley Noon, MD  Subjective:   PATIENT ID: [name] GENDER: female DOB: [birth year], MRN: [id number]  Chief Complaint  Patient presents with   Hospitalization Follow-up    Breathing is overall doing well. She states her cough is prod with green sputum. Has not had any hemoptysis in the past wk or so.    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 Valentino Nose with rheumatology and Dr. Michela Pitcher with pulmonary, bronchoscopy/BAL with [facility] 12/2021, recent admissions for myositis (02/19/22-02/23/22) and (03/29/22-04/05/22) for recurrent pneumonia, first occurrence of acute PE. She was seen by pulmonary consult team and underwent bronch/BAL during admission, ID also followed. Some concern for aspiration, she had speech evaluation 6/7 without overt aspiration but she reported history of globus sensation, regurgitation with meat and prior esophageal stricture. Poor dentition noted. She says she last saw ENT   BAL diff was neutrophilic, PJP negative. Yeast seen on diff quik from BAL, candida glabrata on culture from BAL and from TBLB RUL. AFB pending. Serum fungitell neg. 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  When she left the hospital she was not on oxygen, even with exertion. Still gets dyspneic with minimal exertion. Still coughing up greenish material, no hemoptysis. She is down to 5 mg daily of prednisone. She doesn't think she necessarily feels worse as the steroid dose has been decreased. Has no rash right now. No bleeding issues since she's started eliquis.   Regarding her diagnosis of GPA she says she had ENT do FNL and they did biopsy of nasal mucosa,  had bloodwork supportive of it as well. She has primarily had upper airway involvement, sores on her legs thought to be involved. Has not clearly had renal involvement to her knowledge, has never had kidney biopsy.   Otherwise pertinent review of systems is negative.  Her sister has MS  She never smoked, no MJ, vaping.   Past Medical History:  Diagnosis Date   Chronic cough    Chronic pain    Diabetes mellitus without complication (HCC)    Dyspnea    GERD (gastroesophageal reflux disease)    Hypokalemia    Hypothyroidism    Left wrist pain 06/01/2021   Myonecrosis ([place]) 07/01/2021   Neurocardiogenic syncope    OSA (obstructive sleep apnea)    does not use CPAP   Osteomyelitis (HCC)    bilateral feet   Peripheral neuropathy    Presence of intrathecal pump    recieves Prialt/bupivicaine   Tear of gluteus medius tendon 07/01/2021   Wegener's granulomatosis 2009   Wegner's disease (congenital syphilitic osteochondritis)      Family History  Problem Relation Age of Onset   Diabetes Mother    Heart disease Mother    Diabetes Father      Past Surgical History:  Procedure Laterality Date   AMPUTATION Right 10/21/2020   Procedure: Right hallux amputation;  Surgeon: Wylene Simmer, MD;  Location: [facility];  Service: Orthopedics;  Laterality: Right;   AMPUTATION TOE Bilateral 08/12/2020   Procedure: Right 3rd toe amputation; left hallux and 3rd toe amputation;  Surgeon: Wylene Simmer, MD;  Location: [facility];  Service: Orthopedics;  Laterality: Bilateral;  33mn   AMPUTATION TOE Right 03/24/2021   Procedure: AMPUTATION TOE;  Surgeon: HWylene Simmer MD;  [place] [facility]  Service: Orthopedics;  Laterality: Right;   APPENDECTOMY     BACK SURGERY     BIOPSY  03/30/2022   Procedure: BIOPSY;  Surgeon: OLaurin Coder MD;  [place] [facility]  Service: Pulmonary;;   BRONCHIAL WASHINGS  03/30/2022   Procedure: BRONCHIAL WASHINGS;  Surgeon: OLaurin Coder MD;  [place] [facility]  Service: Pulmonary;;   CHOLECYSTECTOMY     COLONOSCOPY N/A 06/02/2013   Procedure: COLONOSCOPY;  Surgeon: SWonda Horner MD;  [facility];  Service: Endoscopy;  Laterality: N/A;   HERNIA REPAIR  2000   INCISION AND DRAINAGE ABSCESS Left 07/07/2016   Procedure: DEBRIDMENT LEFT THIGH ABSCESS, EXISION ACUTE SKIN RASH LEFT THIGH(1CM LESION);  Surgeon: HFanny Skates MD;  Location[facility];  Service: General;  Laterality: Left;   SEPTOPLASTY  02/2013   spleen anuyism     splenic aneurysm     TRANSMETATARSAL AMPUTATION Bilateral 10/21/2020   Procedure: Left transmetatarsal amputation;  Surgeon: HWylene Simmer MD;  [place] [facility]  Service: Orthopedics;  Laterality: Bilateral;   TRANSMETATARSAL AMPUTATION Left 03/24/2021   Procedure: TRANSMETATARSAL AMPUTATION;  Surgeon: HWylene Simmer MD;  [place] [facility]  Service: Orthopedics;  Laterality: Left;   VAGINAL HYSTERECTOMY     VIDEO BRONCHOSCOPY Right 03/30/2022   Procedure: VIDEO BRONCHOSCOPY WITH FLUORO;  Surgeon: OLaurin Coder MD;  [facility];  Service: Pulmonary;  Laterality: Right;  atypical pneumonia    Social History   Socioeconomic History   Marital status: Married    Spouse name: Not on file   Number of children: Not on file   Years of education: Not on file   Highest education level: Not on file  Occupational History   Not on file  Tobacco Use   Smoking status: Never   Smokeless tobacco: Never  Vaping Use   Vaping Use: Never used  Substance and Sexual Activity   Alcohol use: Yes    Comment: occasionally   Drug use: No   Sexual activity: Yes    Birth control/protection: Surgical  Other Topics Concern   Not on file  Social History Narrative   Not on file   Social Determinants of Health   Financial Resource Strain: Not on file  Food Insecurity: Not on file  Transportation Needs: Not on file  Physical Activity: Not on file  Stress: Not on file   Social Connections: Not on file  Intimate Partner Violence: Not on file     Allergies  Allergen Reactions   Ibuprofen Other (See Comments)    Pt is unable to take this due to kidney problems.      Penicillins Rash and Other (See Comments)    Has patient had a PCN reaction causing immediate rash, facial/tongue/throat swelling, SOB or lightheadedness with hypotension: No Has patient had a PCN reaction causing severe rash involving mucus membranes or skin necrosis: No Has patient had a PCN reaction that required hospitalization No Has patient had a PCN reaction occurring within the last 10 years: No If all of the above answers are "NO", then may proceed with Cephalosporin use.     Outpatient Medications Prior to Visit  Medication Sig Dispense Refill   acetaminophen (TYLENOL) 325 MG tablet Take 2 tablets (650 mg total) by mouth every 6 (six) hours as needed for mild pain (or Fever >/= 101).  albuterol (PROVENTIL HFA;VENTOLIN HFA) 108 (90 Base) MCG/ACT inhaler Inhale 2 puffs into the lungs every 6 (six) hours as needed for wheezing or shortness of breath.     amitriptyline (ELAVIL) 50 MG tablet Take 50 mg by mouth at bedtime.     apixaban (ELIQUIS) 5 MG TABS tablet 27m BID till 04/06/22 then 5 mg BID from 04/07/22 60 tablet 1   Calcium Carb-Cholecalciferol (CALCIUM CARBONATE-VITAMIN D3 PO) Take 1 tablet by mouth daily.     DULoxetine (CYMBALTA) 60 MG capsule Take 60 mg by mouth 2 (two) times daily.     folic acid (FOLVITE) 1 MG tablet Take 2 mg by mouth daily.     gabapentin (NEURONTIN) 300 MG capsule Take 2 capsules (600 mg total) by mouth 3 (three) times daily.     lidocaine (LIDODERM) 5 % Place 1 patch onto the skin daily as needed (for pain). Remove & Discard patch within 12 hours or as directed by MD     linaclotide (LINZESS) 145 MCG CAPS capsule Take 145 mcg by mouth daily before breakfast.     magnesium oxide (MAG-OX) 400 MG tablet magnesium oxide 400 mg (241.3 mg magnesium)  tablet   400 mg by oral route.     Melatonin 10 MG TABS Take 10 mg by mouth at bedtime as needed (sleep).     metFORMIN (GLUCOPHAGE-XR) 500 MG 24 hr tablet Take 500 mg by mouth daily with breakfast.     methocarbamol (ROBAXIN) 500 MG tablet Take 500 mg by mouth every 6 (six) hours as needed for muscle spasms.     methotrexate 50 MG/2ML injection methotrexate sodium 25 mg/mL injection solution     METHOTREXATE SODIUM IJ Inject 25 mg into the skin every Sunday.     naloxone (NARCAN) nasal spray 4 mg/0.1 mL Place 0.4 mg into the nose once.     pantoprazole (PROTONIX) 40 MG tablet Take 40 mg by mouth daily.     Potassium Chloride Crys ER (K-DUR PO) Take 40 mEq by mouth every other day.     predniSONE (DELTASONE) 10 MG tablet Take 1 tab (159m daily for 2 days, then reduce to your home dose of 20m76maily (Patient taking differently: Take 5 mg by mouth daily.)     rosuvastatin (CRESTOR) 10 MG tablet Take 10 mg by mouth at bedtime.     senna (SENOKOT) 8.6 MG TABS tablet Take 2 tablets (17.2 mg total) by mouth 2 (two) times daily. 30 tablet 0   sulfamethoxazole-trimethoprim (BACTRIM) 400-80 MG tablet Take 1 tablet by mouth 3 (three) times a week. Monday,Wednesday and Friday     tapentadol HCl (NUCYNTA) 75 MG tablet Take 75 mg by mouth. Take 1 tablet in the morning, 1 tablet in the afternoon and 2 tablets at bedtime     topiramate (TOPAMAX) 100 MG tablet Take 100 mg by mouth daily.     vitamin B-12 (CYANOCOBALAMIN) 1000 MCG tablet Take 1,000 mcg by mouth daily.     Vitamin D, Ergocalciferol, (DRISDOL) 50000 units CAPS capsule Take 50,000 Units by mouth every Friday.     No facility-administered medications prior to visit.       Objective:   Physical Exam:  General appearance: 56 18o., female, NAD, conversant  Eyes: anicteric sclerae; PERRL, tracking appropriately HENT: NCAT; MMM, breathy voice Neck: Trachea midline; no lymphadenopathy, no JVD Lungs: diminished with faint rhonchi, no wheeze,  with normal respiratory effort CV: RRR, no murmur  Abdomen: Soft, non-tender; non-distended, BS present  Extremities:  No peripheral edema, warm Skin: Normal turgor and texture; no rash Psych: Appropriate affect Neuro: Alert and oriented to person and place, no focal deficit     Vitals:   04/12/22 1049  BP: 112/78  Pulse: 72  Temp: 98.3 F (36.8 C)  TempSrc: Oral  SpO2: 96%  Weight: 199 lb 9.6 oz (90.5 kg)  Height: _0  (1.676 m)   96% on RA BMI Readings from Last 3 Encounters:  04/12/22 32.22 kg/m  04/07/22 33.47 kg/m  03/30/22 34.59 kg/m   Wt Readings from Last 3 Encounters:  04/12/22 199 lb 9.6 oz (90.5 kg)  04/07/22 195 lb (88.5 kg)  03/30/22 201 lb 8 oz (91.4 kg)     CBC    Component Value Date/Time   WBC 7.6 04/05/2022 0405   RBC 3.84 (L) 04/05/2022 0405   HGB 10.0 (L) 04/05/2022 0405   HCT 32.9 (L) 04/05/2022 0405   PLT 268 04/05/2022 0405   MCV 85.7 04/05/2022 0405   MCH 26.0 04/05/2022 0405   MCHC 30.4 04/05/2022 0405   RDW 22.0 (H) 04/05/2022 0405   LYMPHSABS 2.1 04/05/2022 0405   MONOABS 0.4 04/05/2022 0405   EOSABS 0.0 04/05/2022 0405   BASOSABS 0.0 04/05/2022 0405    Chest Imaging: CTA Chest 03/27/22 reviewed by me with multifocal, upper lobe predominant ggo, small RLL/RML PE, chronic R hemidiaphragm elevation (dating back to 2014 at least)  CT Chest 12/2021 failed to download  HRCT Chest 12/20/21 reviewed by me with marked bronchial wall thickening, airway secretions, scattered TIB inflammation and subsegmental atelectasis/scar  Pulmonary Functions Testing Results:     No data to display         OSH PFT 12/22/21 reviewed by me: The FEV1 and FVC are mildly reduced and suggest a restrictive lung defect. The TLC is mildly reduced. There is a moderate reduction of the diffusing capacity.   Echocardiogram:   TTE 03/28/22  1. Left ventricular ejection fraction, by estimation, is 55%. The left  ventricle has normal function. The left  ventricle has no regional wall  motion abnormalities. Left ventricular diastolic parameters were normal.   2. Right ventricular systolic function is normal. The right ventricular  size is normal. There is normal pulmonary artery systolic pressure.   3. The pericardial effusion is anterior to the right ventricle.   4. The mitral valve is abnormal. Trivial mitral valve regurgitation. No  evidence of mitral stenosis.   5. The aortic valve is tricuspid. There is mild calcification of the  aortic valve. Aortic valve regurgitation is not visualized. Aortic valve  sclerosis is present, with no evidence of aortic valve stenosis.   6. The inferior vena cava is normal in size with greater than 50%  respiratory variability, suggesting right atrial pressure of 3 mmHg.      Assessment & Plan:   # Pneumonitis Unclear etiology. Improved after course of ABX, increased steroid dose. Candida recovered from cultures of BAL and TBLB but not BCx and I've never seen candida PNA in a patient that wasn't fungemic - she also got better without antifungal. I'd lean toward CAP from either typical or atypical organism including viral LRTI, recurrence however raises possibility of aspiration (does endorse esophageal phase dysphagia with h/o stenosis) or untreated NTM infx though radiographic appearance now not classic for it. Relatively quick recovery on minimally increased steroid dose seems less consistent with organizing pneumonia which could have either been from flare of GPA (seems doubtful after having just received rituximab), or  rituximab (clinical deterioration after rituximab the last 2 times it was dosed however she's been on this a long time). Wouldn't expect pneumonitis to suddenly show up with this radiographic appearance this late in the course of treatment with methotrexate.   # GPA  # Hoarseness, breathy voice History of upper airway involvement primarily, per pt known vocal cord involvement. This was not  documented during DL by anesthesia before recent bronchoscopy.   # Acute PE:  Small PE burden. ?unprovoked with persistent risk factor (CTD).   # Solid food dysphagia # History of esophageal stenosis  Plan: - f/u AFB culture, has ID appointment tomorrow - HRCT Chest at the end of July - PFTs (breathing tests) next visit in 6 weeks - A bunch of referrals: [facility] - she wishes to establish care for second opinion, ENT for hoarseness/breathy voice and prior GPA involvement, GI for dysphagia and prior stricture - stick with prednisone 5 mg daily, current methotrexate for now - start mucinex 600 BID, albuterol 2 puffs twice daily followed by flutter valve 10 slow but firm puffs twice daily - eliquis for at least 3 months, although PE size small, consideration of longer term AC given persistent risk factor    RTC 6 weeks with Dr. Vaughan Browner or Dr. Chase Caller along with PFTs, review HRCT Chest  Maryjane Hurter, MD [facility] 04/12/2022 10:56 AM

## 2022-04-12 NOTE — Telephone Encounter (Signed)
Hi Dr. Meridee Score,  We received an urgent referral this morning (supervising MD) from [facility] for Esophageal dysphagia.  Patient saw [facility] in March 2023 and requesting to switch GI care because she wants to have all of her doctors with [facility].  Records are in Epic, can you please review and advise scheduling. Thanks

## 2022-04-12 NOTE — Telephone Encounter (Signed)
Thank you. This is pretty unacceptable considering I gave a verbal on Monday to continue until she sees Dr. Orvan Falconer tomorrow.

## 2022-04-12 NOTE — Telephone Encounter (Signed)
Request received to transfer GI care from outside practice to [facility].    I have reviewed her chart.  She has had a recent pulmonary embolism and placed on anticoagulation.  She has a barium esophagram that suggested potential stricture.  She could need endoscopic dilations.  Based on her medical history she could not be done in our outpatient endoscopy center and would have to be done in the hospital outpatient setting (if any procedures were to ever be considered).  Timing of a potential endoscopy would need to be determined by the prescribing provider of her anticoagulation needs and ability to hold.  Our availability for outpatient hospital-based procedures is weeks to months at this point in time unfortunately as well.  She would have to understand this.  She has previously been evaluated by [facility] when she was in the hospital in 2014, not sure she would want a referral to that group in case they have earlier availability.  If she chooses to come to [place], we can set her up for a clinic with one of the APP's or myself and then she can be evaluated but she needs to understand our abilities to consider endoscopies are held as a result of her recent pulmonary embolism and her high risk nature.  If she is seen by one of the APP's, she can then be staffed by the supervising MD that particular day.  In the interim she needs to follow-up with her primary gastroenterology team until she can be seen or if she chooses to be seen at another GI group/center in [place].

## 2022-04-13 ENCOUNTER — Ambulatory Visit (INDEPENDENT_AMBULATORY_CARE_PROVIDER_SITE_OTHER): Payer: BC Managed Care – PPO | Admitting: Internal Medicine

## 2022-04-13 ENCOUNTER — Telehealth: Payer: Self-pay

## 2022-04-13 ENCOUNTER — Encounter: Payer: Self-pay | Admitting: Internal Medicine

## 2022-04-13 ENCOUNTER — Other Ambulatory Visit: Payer: Self-pay

## 2022-04-13 DIAGNOSIS — J189 Pneumonia, unspecified organism: Secondary | ICD-10-CM

## 2022-04-13 NOTE — Telephone Encounter (Signed)
Per Md pull picc after last dose of IV antibiotics today. Called [facility] and spoke to the pharmacist Amy, whom read back order and verbalized understanding.

## 2022-04-13 NOTE — Progress Notes (Signed)
[facility]  Patient Active Problem List   Diagnosis Date Noted   Recurrent pneumonia 04/12/2022    Priority: High   Acute pulmonary embolus (HCC) 03/28/2022   Severe sepsis (HCC) 03/26/2022   UTI (urinary tract infection) 03/26/2022   Opioid dependence (HCC) 03/26/2022   Acute metabolic encephalopathy 03/26/2022   Hip pain 02/20/2022   Arthritis of right hip 02/19/2022   Hypothyroidism    AKI (acute kidney injury) (HCC)    Class 2 obesity    Depression    Essential hypertension    Acute cystitis without hematuria 01/04/2022   Dysfunction of left rotator cuff 01/04/2022   Lobar pneumonia (HCC) 01/04/2022   Acquired absence of other toe(s), unspecified side (HCC) 08/01/2021   Myositis of right lower extremity 07/01/2021   Tear of gluteus medius tendon 07/01/2021   Left wrist pain 06/01/2021   Medication monitoring encounter 05/10/2021   Chronic pain 03/24/2021   Stage 3a chronic kidney disease (HCC) 09/08/2019   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   Steroid-induced hyperglycemia 05/11/2018   Morbid obesity (HCC) 01/25/2018   Neuropathy 01/25/2018   Presence of intrathecal pump 08/02/2017   Rectocele 03/09/2017   Abnormal finding on imaging 12/19/2016   Esophageal dysphagia 12/19/2016   Current chronic use of systemic steroids 10/26/2016   IFG (impaired fasting glucose) 10/26/2016   Arthralgia of multiple joints 07/15/2016   Headache 07/15/2016   Peripheral neuropathy 07/04/2016   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   Neuropathic pain of both feet 01/11/2016   OSA (obstructive sleep apnea) 12/29/2015   Chronic insomnia 12/29/2015   HLD (hyperlipidemia) 12/29/2015   Obesity, Class II, BMI 35-39.9, with comorbidity 12/29/2015   Polyneuropathy associated with underlying disease (HCC) 12/29/2015   Class 2 drug-induced obesity with serious comorbidity and body mass index (BMI) of 35.0 to 35.9 in adult  12/29/2015   Other mechanical complication of implanted electronic neurostimulator of spinal cord electrode (lead), initial encounter (HCC) 08/04/2015   Adjustment disorder with depressed mood 03/29/2015   Mononeuritis 07/20/2014   Obesity (BMI 30-39.9) 07/20/2014   Neurocardiogenic syncope 07/06/2014   Normocytic anemia 06/08/2014   POTS (postural orthostatic tachycardia syndrome) 06/08/2014   Encounter for long-term (current) use of high-risk medication 05/30/2013   Fall 05/30/2013   Other long term (current) drug therapy 05/30/2013   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/30/2013   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/2013   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   Congenital syphilitic osteochondritis    GERD (gastroesophageal reflux disease) 03/07/2012   Other diseases of trachea and bronchus 08/30/2011   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   ALBUTEROL (VENTOLIN HFA) 108 (90 BASE) MCG/ACT INHALER    Inhale 2 puffs into the lungs every 6 (six) hours as needed for wheezing or shortness of breath.   AMITRIPTYLINE (ELAVIL) 50 MG TABLET    Take 50 mg by mouth at bedtime.   APIXABAN (ELIQUIS) 5 MG TABS TABLET    10mg  BID till 04/06/22 then 5  mg BID from 04/07/22   CALCIUM CARB-CHOLECALCIFEROL (CALCIUM CARBONATE-VITAMIN D3 PO)    Take 1 tablet by mouth daily.   DULOXETINE (CYMBALTA) 60 MG CAPSULE    Take 60 mg by mouth 2 (two) times daily.   FOLIC ACID (FOLVITE) 1 MG TABLET    Take 2 mg by mouth daily.    GABAPENTIN (NEURONTIN) 300 MG CAPSULE    Take 2 capsules (600 mg total) by mouth 3 (three) times daily.   GUAIFENESIN (MUCINEX) 600 MG 12 HR TABLET    Take 1 tablet (600 mg total) by mouth 2 (two) times daily.   LIDOCAINE (LIDODERM) 5 %    Place 1 patch onto the skin daily as needed (for pain). Remove & Discard patch within 12 hours or as directed by MD   LINACLOTIDE (LINZESS) 145 MCG CAPS CAPSULE    Take 145 mcg by mouth daily before breakfast.   MAGNESIUM OXIDE (MAG-OX) 400 MG TABLET    magnesium oxide 400 mg (241.3 mg magnesium) tablet   400 mg by oral route.   MELATONIN 10 MG TABS    Take 10 mg by mouth at bedtime as needed (sleep).   METFORMIN (GLUCOPHAGE-XR) 500 MG 24 HR TABLET    Take 500 mg by mouth daily with breakfast.   METHOCARBAMOL (ROBAXIN) 500 MG TABLET    Take 500 mg by mouth every 6 (six) hours as needed for muscle spasms.   METHOTREXATE 50 MG/2ML INJECTION    methotrexate sodium 25 mg/mL injection solution   METHOTREXATE SODIUM IJ    Inject 25 mg into the skin every Sunday.   NALOXONE (NARCAN) NASAL SPRAY 4 MG/0.1 ML    Place 0.4 mg into the nose once.   PANTOPRAZOLE (PROTONIX) 40 MG TABLET    Take 40 mg by mouth daily.   POTASSIUM CHLORIDE CRYS ER (K-DUR PO)    Take 40 mEq by mouth every other day.   PREDNISONE (DELTASONE) 10 MG TABLET    Take 1 tab (10mg ) daily for 2 days, then reduce to your home dose of 5mg  daily   ROSUVASTATIN (CRESTOR) 10 MG TABLET    Take 10 mg by mouth at bedtime.   SENNA (SENOKOT) 8.6 MG TABS TABLET    Take 2 tablets (17.2 mg total) by mouth 2 (two) times daily.   SULFAMETHOXAZOLE-TRIMETHOPRIM (BACTRIM) 400-80 MG TABLET    Take 1 tablet by mouth 3 (three) times a week. Monday,Wednesday and Friday   TAPENTADOL HCL (NUCYNTA) 75 MG TABLET    Take 75 mg by mouth. Take 1 tablet in the morning, 1 tablet in the afternoon and 2 tablets at bedtime   TOPIRAMATE (TOPAMAX) 100 MG TABLET    Take 100 mg by mouth daily.   VITAMIN B-12 (CYANOCOBALAMIN) 1000 MCG TABLET     Take 1,000 mcg by mouth daily.   VITAMIN D, ERGOCALCIFEROL, (DRISDOL) 50000 UNITS CAPS CAPSULE    Take 50,000 Units by mouth every Friday.  Modified Medications   No medications on file  Discontinued Medications   No medications on file    Subjective: [name] is in for her hospital follow-up visit.  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  [name] has had several admissions this year with recurrent pneumonia.  [name] was found to have MRSA pneumonia in May.  [name] was readmitted on 03/27/2022 with fever and altered mental status.  Chest CT scan showed a right pulmonary embolus  and bilateral infiltrates.  [name] was started on empiric antibiotics.  Bronchoscopy on 03/30/2022 showed thick mucoid secretions.  Cultures growing Candida albicans and Candida glabrata which were felt to be insignificant colonizers.  AFB smear was negative and AFB cultures are negative so far pneumocystis DFA was negative.  Her steroid dose was increased and [name] gradually improved.  Chest x-ray on 04/03/2022 showed interval improvement.  [name] is feeling better now and feels like her respiratory status is close to her baseline.  [name] still has some chronic cough productive of small amounts of mucus and dyspnea on exertion but this is not much different than normal.  [name] has not had any problems tolerating her PICC line or IV vancomycin.  [name] completed 7 days of meropenem in the hospital.  [name] also received a few days of cefepime for a possible urinary tract infection.  Her steroids have been tapered back to 5 mg daily.  Review of Systems: Review of Systems  Constitutional:  Negative for chills, diaphoresis and fever.  Respiratory:  Positive for cough, sputum production and shortness of breath. Negative for hemoptysis and wheezing.   Cardiovascular:  Negative for chest pain.  Gastrointestinal:  Negative for diarrhea, nausea and vomiting.    Past  Medical History:  Diagnosis Date   Chronic cough    Chronic pain    Diabetes mellitus without complication (HCC)    Dyspnea    GERD (gastroesophageal reflux disease)    Hypokalemia    Hypothyroidism    Left wrist pain 06/01/2021   Myonecrosis (HCC) 07/01/2021   Neurocardiogenic syncope    OSA (obstructive sleep apnea)    does not use CPAP   Osteomyelitis (HCC)    bilateral feet   Peripheral neuropathy    Presence of intrathecal pump    recieves Prialt/bupivicaine   Tear of gluteus medius tendon 07/01/2021   Wegener's granulomatosis 2009   Wegner's disease (congenital syphilitic osteochondritis)     Social History   Tobacco Use   Smoking status: Never   Smokeless tobacco: Never  Vaping Use   Vaping Use: Never used  Substance Use Topics   Alcohol use: Yes    Comment: occasionally   Drug use: No    Family History  Problem Relation Age of Onset   Diabetes Mother    Heart disease Mother    Diabetes Father     Allergies  Allergen Reactions   Ibuprofen Other (See Comments)    Pt is unable to take this due to kidney problems.      Penicillins Rash and Other (See Comments)    Has patient had a PCN reaction causing immediate rash, facial/tongue/throat swelling, SOB or lightheadedness with hypotension: No Has patient had a PCN reaction causing severe rash involving mucus membranes or skin necrosis: No Has patient had a PCN reaction that required hospitalization No Has patient had a PCN reaction occurring within the last 10 years: No If all of the above answers are "NO", then may proceed with Cephalosporin use.    Objective: Vitals:   04/13/22 1007  BP: 91/60  Pulse: 70  Temp: 98.3 F (36.8 C)  TempSrc: Oral   There is no height or weight on file to calculate BMI.  Physical Exam Constitutional:      Comments: [name] is accompanied by a friend.  [name] is seated in a wheelchair.  [name] is pleasant and in no distress.  Cardiovascular:     Rate and Rhythm: Normal rate and  regular rhythm.  Heart sounds: No murmur heard. Pulmonary:     Effort: Pulmonary effort is normal.     Breath sounds: Normal breath sounds.  Psychiatric:        Mood and Affect: Mood normal.     Lab Results    Problem List Items Addressed This Visit       High   Recurrent pneumonia    The cause of her recurrent pneumonia/pneumonitis remains unclear.  [name] improved with broad empiric antibiotics and an increase in her prednisone dose.  [name] tells me that [name] is scheduled for a follow-up CT scan in about 6 weeks.  I recommend continuing prophylactic trimethoprim sulfamethoxazole due to her long-term immunosuppression.        Michel Bickers, MD [facility] [phone] pager   [phone] cell 04/13/2022, 10:37 AM

## 2022-04-13 NOTE — Assessment & Plan Note (Signed)
The cause of her recurrent pneumonia/pneumonitis remains unclear.  She improved with broad empiric antibiotics and an increase in her prednisone dose.  She tells me that she is scheduled for a follow-up CT scan in about 6 weeks.  I recommend continuing prophylactic trimethoprim sulfamethoxazole due to her long-term immunosuppression.

## 2022-04-14 ENCOUNTER — Telehealth: Payer: Self-pay | Admitting: Family Medicine

## 2022-04-14 ENCOUNTER — Encounter: Payer: Self-pay | Admitting: Gastroenterology

## 2022-04-14 ENCOUNTER — Other Ambulatory Visit: Payer: Self-pay | Admitting: Family Medicine

## 2022-04-14 NOTE — Telephone Encounter (Signed)
Forwarding to provider for review.

## 2022-04-17 ENCOUNTER — Emergency Department (HOSPITAL_COMMUNITY): Payer: BC Managed Care – PPO

## 2022-04-17 ENCOUNTER — Telehealth: Payer: Self-pay | Admitting: Student

## 2022-04-17 ENCOUNTER — Ambulatory Visit (HOSPITAL_COMMUNITY): Payer: BC Managed Care – PPO

## 2022-04-17 ENCOUNTER — Inpatient Hospital Stay (HOSPITAL_COMMUNITY)
Admission: EM | Admit: 2022-04-17 | Discharge: 2022-05-18 | DRG: 871 | Disposition: A | Discharge status: Skilled Nursing Facility | Payer: BC Managed Care – PPO | Attending: Internal Medicine | Admitting: Internal Medicine

## 2022-04-17 ENCOUNTER — Other Ambulatory Visit: Payer: Self-pay

## 2022-04-17 ENCOUNTER — Encounter (HOSPITAL_COMMUNITY): Payer: Self-pay

## 2022-04-17 DIAGNOSIS — Z886 Allergy status to analgesic agent status: Secondary | ICD-10-CM

## 2022-04-17 DIAGNOSIS — K7200 Acute and subacute hepatic failure without coma: Secondary | ICD-10-CM | POA: Diagnosis present

## 2022-04-17 DIAGNOSIS — I82402 Acute embolism and thrombosis of unspecified deep veins of left lower extremity: Secondary | ICD-10-CM

## 2022-04-17 DIAGNOSIS — U071 COVID-19: Secondary | ICD-10-CM

## 2022-04-17 DIAGNOSIS — G9341 Metabolic encephalopathy: Secondary | ICD-10-CM | POA: Diagnosis present

## 2022-04-17 DIAGNOSIS — I2699 Other pulmonary embolism without acute cor pulmonale: Secondary | ICD-10-CM

## 2022-04-17 DIAGNOSIS — E8770 Fluid overload, unspecified: Secondary | ICD-10-CM | POA: Diagnosis not present

## 2022-04-17 DIAGNOSIS — Z8249 Family history of ischemic heart disease and other diseases of the circulatory system: Secondary | ICD-10-CM

## 2022-04-17 DIAGNOSIS — R440 Auditory hallucinations: Secondary | ICD-10-CM | POA: Diagnosis not present

## 2022-04-17 DIAGNOSIS — E785 Hyperlipidemia, unspecified: Secondary | ICD-10-CM | POA: Diagnosis present

## 2022-04-17 DIAGNOSIS — G9349 Other encephalopathy: Secondary | ICD-10-CM | POA: Diagnosis not present

## 2022-04-17 DIAGNOSIS — I10 Essential (primary) hypertension: Secondary | ICD-10-CM | POA: Diagnosis not present

## 2022-04-17 DIAGNOSIS — Z7952 Long term (current) use of systemic steroids: Secondary | ICD-10-CM | POA: Diagnosis not present

## 2022-04-17 DIAGNOSIS — Z79899 Other long term (current) drug therapy: Secondary | ICD-10-CM

## 2022-04-17 DIAGNOSIS — D849 Immunodeficiency, unspecified: Secondary | ICD-10-CM | POA: Diagnosis present

## 2022-04-17 DIAGNOSIS — N3000 Acute cystitis without hematuria: Secondary | ICD-10-CM | POA: Diagnosis not present

## 2022-04-17 DIAGNOSIS — F1120 Opioid dependence, uncomplicated: Secondary | ICD-10-CM | POA: Diagnosis present

## 2022-04-17 DIAGNOSIS — J189 Pneumonia, unspecified organism: Secondary | ICD-10-CM | POA: Diagnosis present

## 2022-04-17 DIAGNOSIS — G4733 Obstructive sleep apnea (adult) (pediatric): Secondary | ICD-10-CM | POA: Diagnosis present

## 2022-04-17 DIAGNOSIS — R6520 Severe sepsis without septic shock: Secondary | ICD-10-CM | POA: Diagnosis not present

## 2022-04-17 DIAGNOSIS — R4182 Altered mental status, unspecified: Secondary | ICD-10-CM | POA: Diagnosis present

## 2022-04-17 DIAGNOSIS — E669 Obesity, unspecified: Secondary | ICD-10-CM | POA: Diagnosis present

## 2022-04-17 DIAGNOSIS — E1165 Type 2 diabetes mellitus with hyperglycemia: Secondary | ICD-10-CM | POA: Diagnosis not present

## 2022-04-17 DIAGNOSIS — T451X5A Adverse effect of antineoplastic and immunosuppressive drugs, initial encounter: Secondary | ICD-10-CM | POA: Diagnosis present

## 2022-04-17 DIAGNOSIS — N390 Urinary tract infection, site not specified: Secondary | ICD-10-CM | POA: Diagnosis present

## 2022-04-17 DIAGNOSIS — Z792 Long term (current) use of antibiotics: Secondary | ICD-10-CM

## 2022-04-17 DIAGNOSIS — N170 Acute kidney failure with tubular necrosis: Secondary | ICD-10-CM | POA: Diagnosis present

## 2022-04-17 DIAGNOSIS — M62838 Other muscle spasm: Secondary | ICD-10-CM | POA: Diagnosis not present

## 2022-04-17 DIAGNOSIS — I82412 Acute embolism and thrombosis of left femoral vein: Secondary | ICD-10-CM | POA: Diagnosis not present

## 2022-04-17 DIAGNOSIS — Z833 Family history of diabetes mellitus: Secondary | ICD-10-CM

## 2022-04-17 DIAGNOSIS — K7682 Hepatic encephalopathy: Secondary | ICD-10-CM | POA: Diagnosis not present

## 2022-04-17 DIAGNOSIS — J1282 Pneumonia due to coronavirus disease 2019: Secondary | ICD-10-CM | POA: Diagnosis present

## 2022-04-17 DIAGNOSIS — E039 Hypothyroidism, unspecified: Secondary | ICD-10-CM | POA: Diagnosis present

## 2022-04-17 DIAGNOSIS — T413X5A Adverse effect of local anesthetics, initial encounter: Secondary | ICD-10-CM | POA: Diagnosis not present

## 2022-04-17 DIAGNOSIS — W19XXXA Unspecified fall, initial encounter: Secondary | ICD-10-CM | POA: Diagnosis present

## 2022-04-17 DIAGNOSIS — E2749 Other adrenocortical insufficiency: Secondary | ICD-10-CM | POA: Diagnosis present

## 2022-04-17 DIAGNOSIS — A419 Sepsis, unspecified organism: Principal | ICD-10-CM | POA: Diagnosis present

## 2022-04-17 DIAGNOSIS — R7401 Elevation of levels of liver transaminase levels: Secondary | ICD-10-CM | POA: Diagnosis not present

## 2022-04-17 DIAGNOSIS — Z880 Allergy status to penicillin: Secondary | ICD-10-CM

## 2022-04-17 DIAGNOSIS — T82838A Hemorrhage of vascular prosthetic devices, implants and grafts, initial encounter: Secondary | ICD-10-CM | POA: Diagnosis not present

## 2022-04-17 DIAGNOSIS — E872 Acidosis, unspecified: Secondary | ICD-10-CM | POA: Diagnosis present

## 2022-04-17 DIAGNOSIS — T796XXA Traumatic ischemia of muscle, initial encounter: Secondary | ICD-10-CM | POA: Diagnosis not present

## 2022-04-17 DIAGNOSIS — N179 Acute kidney failure, unspecified: Secondary | ICD-10-CM | POA: Diagnosis present

## 2022-04-17 DIAGNOSIS — Y92019 Unspecified place in single-family (private) house as the place of occurrence of the external cause: Secondary | ICD-10-CM | POA: Diagnosis not present

## 2022-04-17 DIAGNOSIS — Z20822 Contact with and (suspected) exposure to covid-19: Secondary | ICD-10-CM | POA: Diagnosis present

## 2022-04-17 DIAGNOSIS — R251 Tremor, unspecified: Secondary | ICD-10-CM | POA: Diagnosis not present

## 2022-04-17 DIAGNOSIS — E871 Hypo-osmolality and hyponatremia: Secondary | ICD-10-CM | POA: Diagnosis not present

## 2022-04-17 DIAGNOSIS — M313 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: Secondary | ICD-10-CM | POA: Diagnosis present

## 2022-04-17 DIAGNOSIS — R278 Other lack of coordination: Secondary | ICD-10-CM | POA: Diagnosis not present

## 2022-04-17 DIAGNOSIS — D62 Acute posthemorrhagic anemia: Secondary | ICD-10-CM | POA: Diagnosis not present

## 2022-04-17 DIAGNOSIS — K580 Irritable bowel syndrome with diarrhea: Secondary | ICD-10-CM | POA: Diagnosis present

## 2022-04-17 DIAGNOSIS — G8929 Other chronic pain: Secondary | ICD-10-CM | POA: Diagnosis present

## 2022-04-17 DIAGNOSIS — J9601 Acute respiratory failure with hypoxia: Secondary | ICD-10-CM | POA: Diagnosis present

## 2022-04-17 DIAGNOSIS — N183 Chronic kidney disease, stage 3 unspecified: Secondary | ICD-10-CM | POA: Diagnosis present

## 2022-04-17 DIAGNOSIS — K921 Melena: Secondary | ICD-10-CM | POA: Diagnosis not present

## 2022-04-17 DIAGNOSIS — Z7901 Long term (current) use of anticoagulants: Secondary | ICD-10-CM

## 2022-04-17 DIAGNOSIS — I129 Hypertensive chronic kidney disease with stage 1 through stage 4 chronic kidney disease, or unspecified chronic kidney disease: Secondary | ICD-10-CM | POA: Diagnosis present

## 2022-04-17 DIAGNOSIS — Z86711 Personal history of pulmonary embolism: Secondary | ICD-10-CM

## 2022-04-17 DIAGNOSIS — Z7984 Long term (current) use of oral hypoglycemic drugs: Secondary | ICD-10-CM

## 2022-04-17 DIAGNOSIS — Y841 Kidney dialysis as the cause of abnormal reaction of the patient, or of later complication, without mention of misadventure at the time of the procedure: Secondary | ICD-10-CM | POA: Diagnosis not present

## 2022-04-17 DIAGNOSIS — K649 Unspecified hemorrhoids: Secondary | ICD-10-CM | POA: Diagnosis not present

## 2022-04-17 DIAGNOSIS — G620 Drug-induced polyneuropathy: Secondary | ICD-10-CM | POA: Diagnosis present

## 2022-04-17 DIAGNOSIS — M6282 Rhabdomyolysis: Secondary | ICD-10-CM | POA: Diagnosis present

## 2022-04-17 DIAGNOSIS — I2782 Chronic pulmonary embolism: Secondary | ICD-10-CM | POA: Diagnosis not present

## 2022-04-17 DIAGNOSIS — K219 Gastro-esophageal reflux disease without esophagitis: Secondary | ICD-10-CM | POA: Diagnosis not present

## 2022-04-17 DIAGNOSIS — E875 Hyperkalemia: Secondary | ICD-10-CM | POA: Diagnosis not present

## 2022-04-17 DIAGNOSIS — R531 Weakness: Principal | ICD-10-CM

## 2022-04-17 DIAGNOSIS — S300XXA Contusion of lower back and pelvis, initial encounter: Secondary | ICD-10-CM | POA: Diagnosis present

## 2022-04-17 DIAGNOSIS — E1122 Type 2 diabetes mellitus with diabetic chronic kidney disease: Secondary | ICD-10-CM | POA: Diagnosis present

## 2022-04-17 DIAGNOSIS — Z6835 Body mass index (BMI) 35.0-35.9, adult: Secondary | ICD-10-CM

## 2022-04-17 DIAGNOSIS — M7989 Other specified soft tissue disorders: Secondary | ICD-10-CM | POA: Diagnosis not present

## 2022-04-17 DIAGNOSIS — H15003 Unspecified scleritis, bilateral: Secondary | ICD-10-CM

## 2022-04-17 LAB — URINALYSIS, ROUTINE W REFLEX MICROSCOPIC
Bilirubin Urine: NEGATIVE
Glucose, UA: 50 mg/dL — AB
Ketones, ur: NEGATIVE mg/dL
Nitrite: NEGATIVE
Protein, ur: 100 mg/dL — AB
Specific Gravity, Urine: 1.019 (ref 1.005–1.030)
WBC, UA: >50 WBC/hpf — ABNORMAL HIGH (ref 0–5)
pH: 5 (ref 5.0–8.0)

## 2022-04-17 LAB — CBC WITH DIFFERENTIAL/PLATELET
Abs Immature Granulocytes: 0.5 10*3/uL — ABNORMAL HIGH (ref 0.00–0.07)
Basophils Absolute: 0.2 10*3/uL — ABNORMAL HIGH (ref 0.0–0.1)
Basophils Relative: 1 %
Eosinophils Absolute: 0.1 10*3/uL (ref 0.0–0.5)
Eosinophils Relative: 0 %
HCT: 38.1 % (ref 36.0–46.0)
Hemoglobin: 11.2 g/dL — ABNORMAL LOW (ref 12.0–15.0)
Immature Granulocytes: 2 %
Lymphocytes Relative: 18 %
Lymphs Abs: 4.3 10*3/uL — ABNORMAL HIGH (ref 0.7–4.0)
MCH: 26.6 pg (ref 26.0–34.0)
MCHC: 29.4 g/dL — ABNORMAL LOW (ref 30.0–36.0)
MCV: 90.5 fL (ref 80.0–100.0)
Monocytes Absolute: 1 10*3/uL (ref 0.1–1.0)
Monocytes Relative: 4 %
Neutro Abs: 18.4 10*3/uL — ABNORMAL HIGH (ref 1.7–7.7)
Neutrophils Relative %: 75 %
Platelets: 286 10*3/uL (ref 150–400)
RBC: 4.21 MIL/uL (ref 3.87–5.11)
RDW: 21.5 % — ABNORMAL HIGH (ref 11.5–15.5)
WBC Morphology: INCREASED
WBC: 24.5 10*3/uL — ABNORMAL HIGH (ref 4.0–10.5)
nRBC: 0.3 % — ABNORMAL HIGH (ref 0.0–0.2)

## 2022-04-17 LAB — LACTIC ACID, PLASMA
Lactic Acid, Venous: 4.7 mmol/L (ref 0.5–1.9)
Lactic Acid, Venous: 4 mmol/L (ref 0.5–1.9)
Lactic Acid, Venous: 3.9 mmol/L (ref 0.5–1.9)
Lactic Acid, Venous: 3.9 mmol/L (ref 0.5–1.9)

## 2022-04-17 LAB — COMPREHENSIVE METABOLIC PANEL
ALT: 54 U/L — ABNORMAL HIGH (ref 0–44)
AST: 202 U/L — ABNORMAL HIGH (ref 15–41)
Albumin: 2.9 g/dL — ABNORMAL LOW (ref 3.5–5.0)
Alkaline Phosphatase: 103 U/L (ref 38–126)
Anion gap: 13 (ref 5–15)
BUN: 14 mg/dL (ref 6–20)
CO2: 17 mmol/L — ABNORMAL LOW (ref 22–32)
Calcium: 8.7 mg/dL — ABNORMAL LOW (ref 8.9–10.3)
Chloride: 106 mmol/L (ref 98–111)
Creatinine, Ser: 1.73 mg/dL — ABNORMAL HIGH (ref 0.44–1.00)
GFR, Estimated: 34 mL/min — ABNORMAL LOW (ref >60)
Glucose, Bld: 253 mg/dL — ABNORMAL HIGH (ref 70–99)
Potassium: 4.7 mmol/L (ref 3.5–5.1)
Sodium: 136 mmol/L (ref 135–145)
Total Bilirubin: 0.7 mg/dL (ref 0.3–1.2)
Total Protein: 5.9 g/dL — ABNORMAL LOW (ref 6.5–8.1)

## 2022-04-17 LAB — PROTIME-INR
INR: 1.8 — ABNORMAL HIGH (ref 0.8–1.2)
Prothrombin Time: 21 seconds — ABNORMAL HIGH (ref 11.4–15.2)

## 2022-04-17 LAB — APTT: aPTT: 34 seconds (ref 24–36)

## 2022-04-17 LAB — RESP PANEL BY RT-PCR (FLU A&B, COVID) ARPGX2
Influenza A by PCR: NEGATIVE
Influenza B by PCR: NEGATIVE
SARS Coronavirus 2 by RT PCR: POSITIVE — AB

## 2022-04-17 LAB — GLUCOSE, CAPILLARY: Glucose-Capillary: 310 mg/dL — ABNORMAL HIGH (ref 70–99)

## 2022-04-17 MED ORDER — VANCOMYCIN HCL 750 MG/150ML IV SOLN
750.0000 mg | INTRAVENOUS | Status: DC
  Administered 2022-04-18: 750 mg via INTRAVENOUS
  Filled 2022-04-17 (×2): qty 150

## 2022-04-17 MED ORDER — VANCOMYCIN HCL IN DEXTROSE 1-5 GM/200ML-% IV SOLN: 1000.0000 mg | Freq: Once | INTRAVENOUS | Status: DC

## 2022-04-17 MED ORDER — ACETAMINOPHEN 325 MG PO TABS
650.0000 mg | ORAL_TABLET | Freq: Four times a day (QID) | ORAL | Status: DC | PRN
  Administered 2022-04-17 – 2022-05-14 (×16): 650 mg via ORAL
  Filled 2022-04-17 (×17): qty 2

## 2022-04-17 MED ORDER — TOPIRAMATE 100 MG PO TABS
100.0000 mg | ORAL_TABLET | Freq: Every day | ORAL | Status: DC
  Administered 2022-04-18 – 2022-04-19 (×2): 100 mg via ORAL
  Filled 2022-04-17 (×2): qty 1

## 2022-04-17 MED ORDER — TAPENTADOL HCL 75 MG PO TABS
37.5000 mg | ORAL_TABLET | Freq: Two times a day (BID) | ORAL | Status: DC
Start: 2022-04-17 — End: 2022-04-18

## 2022-04-17 MED ORDER — INSULIN ASPART 100 UNIT/ML IJ SOLN
0.0000 [IU] | Freq: Three times a day (TID) | INTRAMUSCULAR | Status: DC
  Administered 2022-04-18 (×2): 3 [IU] via SUBCUTANEOUS
  Administered 2022-04-18 – 2022-04-19 (×2): 2 [IU] via SUBCUTANEOUS
  Administered 2022-04-19: 3 [IU] via SUBCUTANEOUS
  Administered 2022-04-19 – 2022-04-20 (×3): 2 [IU] via SUBCUTANEOUS
  Administered 2022-04-20: 3 [IU] via SUBCUTANEOUS
  Administered 2022-04-21: 2 [IU] via SUBCUTANEOUS

## 2022-04-17 MED ORDER — LINACLOTIDE 145 MCG PO CAPS
145.0000 ug | ORAL_CAPSULE | Freq: Every day | ORAL | Status: DC
  Administered 2022-04-18 – 2022-05-18 (×24): 145 ug via ORAL
  Filled 2022-04-17 (×31): qty 1

## 2022-04-17 MED ORDER — LIDOCAINE 5 % EX PTCH
1.0000 | MEDICATED_PATCH | Freq: Every day | CUTANEOUS | Status: DC | PRN
Start: 2022-04-17 — End: 2022-05-18
  Administered 2022-04-17 – 2022-05-16 (×2): 1 via TRANSDERMAL
  Filled 2022-04-17 (×4): qty 1

## 2022-04-17 MED ORDER — MAGNESIUM OXIDE -MG SUPPLEMENT 400 (240 MG) MG PO TABS
400.0000 mg | ORAL_TABLET | Freq: Every day | ORAL | Status: DC
  Administered 2022-04-18 – 2022-04-19 (×2): 400 mg via ORAL
  Filled 2022-04-17 (×2): qty 1

## 2022-04-17 MED ORDER — VANCOMYCIN HCL 2000 MG/400ML IV SOLN
2000.0000 mg | Freq: Once | INTRAVENOUS | Status: AC
  Administered 2022-04-17: 2000 mg via INTRAVENOUS
  Filled 2022-04-17: qty 400

## 2022-04-17 MED ORDER — SODIUM CHLORIDE 0.9 % IV SOLN
2.0000 g | Freq: Once | INTRAVENOUS | Status: AC
  Administered 2022-04-17: 2 g via INTRAVENOUS
  Filled 2022-04-17: qty 12.5

## 2022-04-17 MED ORDER — INSULIN GLARGINE-YFGN 100 UNIT/ML ~~LOC~~ SOLN
10.0000 [IU] | Freq: Every day | SUBCUTANEOUS | Status: DC
  Administered 2022-04-17 – 2022-04-20 (×4): 10 [IU] via SUBCUTANEOUS
  Filled 2022-04-17 (×5): qty 0.1

## 2022-04-17 MED ORDER — LACTATED RINGERS IV BOLUS (SEPSIS)
1000.0000 mL | Freq: Once | INTRAVENOUS | Status: AC
  Administered 2022-04-17: 1000 mL via INTRAVENOUS

## 2022-04-17 MED ORDER — PREDNISONE 20 MG PO TABS
20.0000 mg | ORAL_TABLET | Freq: Two times a day (BID) | ORAL | Status: DC
  Administered 2022-04-18 – 2022-04-27 (×18): 20 mg via ORAL
  Filled 2022-04-17 (×18): qty 1

## 2022-04-17 MED ORDER — SENNA 8.6 MG PO TABS
2.0000 | ORAL_TABLET | Freq: Two times a day (BID) | ORAL | Status: DC
  Administered 2022-04-17 – 2022-05-18 (×25): 17.2 mg via ORAL
  Filled 2022-04-17 (×40): qty 2

## 2022-04-17 MED ORDER — ONDANSETRON HCL 4 MG PO TABS: 4.0000 mg | ORAL_TABLET | Freq: Four times a day (QID) | ORAL | Status: DC | PRN

## 2022-04-17 MED ORDER — ONDANSETRON HCL 4 MG/2ML IJ SOLN: 4.0000 mg | Freq: Four times a day (QID) | INTRAMUSCULAR | Status: DC | PRN

## 2022-04-17 MED ORDER — GUAIFENESIN-DM 100-10 MG/5ML PO SYRP
10.0000 mL | ORAL_SOLUTION | Freq: Two times a day (BID) | ORAL | Status: DC
  Administered 2022-04-17 – 2022-05-13 (×51): 10 mL via ORAL
  Filled 2022-04-17 (×53): qty 10

## 2022-04-17 MED ORDER — SODIUM CHLORIDE 0.9 % IV SOLN
2.0000 g | Freq: Two times a day (BID) | INTRAVENOUS | Status: DC
  Administered 2022-04-17 – 2022-04-19 (×4): 2 g via INTRAVENOUS
  Filled 2022-04-17 (×4): qty 12.5

## 2022-04-17 MED ORDER — FENTANYL CITRATE PF 50 MCG/ML IJ SOSY
50.0000 ug | PREFILLED_SYRINGE | Freq: Once | INTRAMUSCULAR | Status: AC
  Administered 2022-04-17: 50 ug via INTRAVENOUS
  Filled 2022-04-17: qty 1

## 2022-04-17 MED ORDER — APIXABAN 5 MG PO TABS
5.0000 mg | ORAL_TABLET | Freq: Two times a day (BID) | ORAL | Status: DC
  Administered 2022-04-17 – 2022-04-23 (×11): 5 mg via ORAL
  Filled 2022-04-17 (×12): qty 1

## 2022-04-17 MED ORDER — DULOXETINE HCL 60 MG PO CPEP
60.0000 mg | ORAL_CAPSULE | Freq: Two times a day (BID) | ORAL | Status: DC
  Administered 2022-04-17 – 2022-04-24 (×13): 60 mg via ORAL
  Filled 2022-04-17 (×14): qty 1

## 2022-04-17 MED ORDER — LACTATED RINGERS IV SOLN: INTRAVENOUS | Status: AC

## 2022-04-17 MED ORDER — HYDROCORTISONE SOD SUC (PF) 100 MG IJ SOLR
100.0000 mg | Freq: Once | INTRAMUSCULAR | Status: AC
Start: 2022-04-17 — End: 2022-04-17
  Administered 2022-04-17: 100 mg via INTRAVENOUS
  Filled 2022-04-17: qty 2

## 2022-04-17 MED ORDER — METHOCARBAMOL 500 MG PO TABS
500.0000 mg | ORAL_TABLET | Freq: Three times a day (TID) | ORAL | Status: DC | PRN
  Administered 2022-04-20 – 2022-05-03 (×8): 500 mg via ORAL
  Filled 2022-04-17 (×8): qty 1

## 2022-04-17 MED ORDER — VITAMIN B-12 1000 MCG PO TABS
1000.0000 ug | ORAL_TABLET | Freq: Every day | ORAL | Status: DC
  Administered 2022-04-18 – 2022-05-18 (×31): 1000 ug via ORAL
  Filled 2022-04-17 (×31): qty 1

## 2022-04-17 MED ORDER — PANTOPRAZOLE SODIUM 40 MG PO TBEC
40.0000 mg | DELAYED_RELEASE_TABLET | Freq: Every day | ORAL | Status: DC
  Administered 2022-04-18 – 2022-05-18 (×31): 40 mg via ORAL
  Filled 2022-04-17 (×31): qty 1

## 2022-04-17 MED ORDER — IPRATROPIUM-ALBUTEROL 0.5-2.5 (3) MG/3ML IN SOLN
3.0000 mL | Freq: Four times a day (QID) | RESPIRATORY_TRACT | Status: DC
  Administered 2022-04-17 – 2022-04-18 (×2): 3 mL via RESPIRATORY_TRACT
  Filled 2022-04-17 (×2): qty 3

## 2022-04-17 MED ORDER — METRONIDAZOLE 500 MG/100ML IV SOLN
500.0000 mg | Freq: Once | INTRAVENOUS | Status: AC
  Administered 2022-04-17: 500 mg via INTRAVENOUS
  Filled 2022-04-17: qty 100

## 2022-04-17 MED ORDER — FOLIC ACID 1 MG PO TABS
2.0000 mg | ORAL_TABLET | Freq: Every day | ORAL | Status: DC
  Administered 2022-04-18 – 2022-05-18 (×31): 2 mg via ORAL
  Filled 2022-04-17 (×31): qty 2

## 2022-04-17 NOTE — Assessment & Plan Note (Addendum)
-  Continue holding home antihypertensive agents in the setting of acute sepsis and worsening renal function -Blood pressure stable at this point. -Heart healthy diet ordered.

## 2022-04-17 NOTE — ED Provider Notes (Signed)
[facility] EMERGENCY DEPARTMENT Provider Note   CSN: [id number] Arrival date & time: 04/17/22  1048     History  Chief Complaint  Patient presents with   Urinary Frequency   Shortness of Breath    [name] is a 56 y.o. female.   Urinary Frequency Associated symptoms include shortness of breath.  Shortness of Breath  Patient has history of diabetes, Wegener's, hypothyroidism, GERD, neuropathy, chronic pain, osteomyelitis, sepsis. Patient has a complex history of having Wegener's disease with recurrent pulmonary infections and chronic steroid use.  Patient was in the hospital on June 4.  At that time she was admitted for pneumonia and determined to have a PE and was also treated for infection.  Patient has followed up with infectious disease as an outpatient.  Husband states last night she started to have little bit of confusion but he did not find that to unusual for her.  She has had issues like this in the past.  However he went to work this morning and tried to check on her and she did not answer the phone.  He found the patient on the floor.  Patient did feel feverish.  She is not sure if she injured herself but she is having some pain in her back.  She does feel weak all over.  Home Medications Prior to Admission medications   Medication Sig Start Date End Date Taking? Authorizing Provider  acetaminophen (TYLENOL) 325 MG tablet Take 2 tablets (650 mg total) by mouth every 6 (six) hours as needed for mild pain (or Fever >/= 101). 07/12/16   Joseph Art, DO  albuterol (VENTOLIN HFA) 108 (90 Base) MCG/ACT inhaler Inhale 2 puffs into the lungs every 6 (six) hours as needed for wheezing or shortness of breath. 04/12/22   Omar Person, MD  amitriptyline (ELAVIL) 50 MG tablet Take 50 mg by mouth at bedtime. 11/04/21   [provider]  apixaban (ELIQUIS) 5 MG TABS tablet 10mg  BID till 04/06/22 then 5 mg BID from 04/07/22 04/05/22   04/07/22, MD  Calcium  Carb-Cholecalciferol (CALCIUM CARBONATE-VITAMIN D3 PO) Take 1 tablet by mouth daily.    [provider]  DULoxetine (CYMBALTA) 60 MG capsule Take 60 mg by mouth 2 (two) times daily.    [provider]  folic acid (FOLVITE) 1 MG tablet Take 2 mg by mouth daily.    [provider]  gabapentin (NEURONTIN) 300 MG capsule Take 2 capsules (600 mg total) by mouth 3 (three) times daily. 04/05/22   04/07/22, MD  guaiFENesin (MUCINEX) 600 MG 12 hr tablet Take 1 tablet (600 mg total) by mouth 2 (two) times daily. 04/12/22   04/14/22, MD  lidocaine (LIDODERM) 5 % Place 1 patch onto the skin daily as needed (for pain). Remove & Discard patch within 12 hours or as directed by MD    [provider]  linaclotide Omar Person) 145 MCG CAPS capsule Take 145 mcg by mouth daily before breakfast. 09/21/21   [provider]  magnesium oxide (MAG-OX) 400 MG tablet magnesium oxide 400 mg (241.3 mg magnesium) tablet   400 mg by oral route.    [provider]  Melatonin 10 MG TABS Take 10 mg by mouth at bedtime as needed (sleep).    [provider]  metFORMIN (GLUCOPHAGE-XR) 500 MG 24 hr tablet Take 500 mg by mouth daily with breakfast. 03/13/22   [provider]  methocarbamol (ROBAXIN) 500 MG tablet Take 500 mg  by mouth every 6 (six) hours as needed for muscle spasms. 02/02/22   [provider]  methotrexate 50 MG/2ML injection methotrexate sodium 25 mg/mL injection solution    [provider]  METHOTREXATE SODIUM IJ Inject 25 mg into the skin every Sunday.    [provider]  naloxone [facility]) nasal spray 4 mg/0.1 mL Place 0.4 mg into the nose once. 12/11/19   [provider]  pantoprazole (PROTONIX) 40 MG tablet Take 40 mg by mouth daily. 07/01/21   [provider]  Potassium Chloride Crys ER (K-DUR PO) Take 40 mEq by mouth every other day.    [provider]  predniSONE (DELTASONE) 10 MG  tablet Take 1 tab (10mg ) daily for 2 days, then reduce to your home dose of 5mg  daily Patient taking differently: Take 5 mg by mouth daily. 04/05/22   , MD  rosuvastatin (CRESTOR) 10 MG tablet Take 10 mg by mouth at bedtime. 10/25/21   [provider]  senna (SENOKOT) 8.6 MG TABS tablet Take 2 tablets (17.2 mg total) by mouth 2 (two) times daily. 03/25/21   12/23/21, PA-C  sulfamethoxazole-trimethoprim (BACTRIM) 400-80 MG tablet Take 1 tablet by mouth 3 (three) times a week. Monday,Wednesday and Friday 07/13/21   [provider]  tapentadol HCl (NUCYNTA) 75 MG tablet Take 75 mg by mouth. Take 1 tablet in the morning, 1 tablet in the afternoon and 2 tablets at bedtime    [provider]  topiramate (TOPAMAX) 100 MG tablet Take 100 mg by mouth daily. 01/14/18   [provider]  vitamin B-12 (CYANOCOBALAMIN) 1000 MCG tablet Take 1,000 mcg by mouth daily.    [provider]  Vitamin D, Ergocalciferol, (DRISDOL) 50000 units CAPS capsule Take 50,000 Units by mouth every Friday.    [provider]      Allergies    Ibuprofen and Penicillins    Review of Systems   Review of Systems  Respiratory:  Positive for shortness of breath.   Genitourinary:  Positive for frequency.    Physical Exam Updated Vital Signs BP 96/71   Pulse 96   Temp 100 F (37.8 C) (Oral)   Resp (!) 28   Ht 1.676 m (5\' 6" )   Wt 91 kg   SpO2 100%   BMI 32.38 kg/m  Physical Exam Vitals and nursing note reviewed.  Constitutional:      Appearance: She is ill-appearing.  HENT:     Head: Normocephalic and atraumatic.     Right Ear: External ear normal.     Left Ear: External ear normal.  Eyes:     General: No scleral icterus.       Right eye: No discharge.        Left eye: No discharge.     Conjunctiva/sclera: Conjunctivae normal.  Neck:     Trachea: No tracheal deviation.  Cardiovascular:     Rate and Rhythm: Normal rate and regular rhythm.   Pulmonary:     Effort: Pulmonary effort is normal. No respiratory distress.     Breath sounds: Normal breath sounds. No stridor. No wheezing or rales.  Abdominal:     General: Bowel sounds are normal. There is no distension.     Palpations: Abdomen is soft.     Tenderness: There is no abdominal tenderness. There is no guarding or rebound.  Musculoskeletal:        General: No tenderness or deformity.     Cervical back: Neck supple.  No bony tenderness.     Thoracic back: Bony tenderness present.     Lumbar back: Bony tenderness present.  Skin:    General: Skin is warm and dry.     Findings: No rash.  Neurological:     General: No focal deficit present.     Mental Status: She is alert.     Cranial Nerves: No cranial nerve deficit (no facial droop, extraocular movements intact, no slurred speech).     Sensory: No sensory deficit.     Motor: No abnormal muscle tone or seizure activity.     Coordination: Coordination normal.  Psychiatric:        Mood and Affect: Mood normal.     ED Results / Procedures / Treatments   Labs (all labs ordered are listed, but only abnormal results are displayed) Labs Reviewed  COMPREHENSIVE METABOLIC PANEL - Abnormal; Notable for the following components:      Result Value   CO2 17 (*)    Glucose, Bld 253 (*)    Creatinine, Ser 1.73 (*)    Calcium 8.7 (*)    Total Protein 5.9 (*)    Albumin 2.9 (*)    AST 202 (*)    ALT 54 (*)    GFR, Estimated 34 (*)    All other components within normal limits  LACTIC ACID, PLASMA - Abnormal; Notable for the following components:   Lactic Acid, Venous 4.7 (*)    All other components within normal limits  CBC WITH DIFFERENTIAL/PLATELET - Abnormal; Notable for the following components:   WBC 24.5 (*)    Hemoglobin 11.2 (*)    MCHC 29.4 (*)    RDW 21.5 (*)    nRBC 0.3 (*)    Neutro Abs 18.4 (*)    Lymphs Abs 4.3 (*)    Basophils Absolute 0.2 (*)    Abs Immature Granulocytes 0.50 (*)    All other  components within normal limits  PROTIME-INR - Abnormal; Notable for the following components:   Prothrombin Time 21.0 (*)    INR 1.8 (*)    All other components within normal limits  CULTURE, BLOOD (ROUTINE X 2)  CULTURE, BLOOD (ROUTINE X 2)  RESP PANEL BY RT-PCR (FLU A&B, COVID) ARPGX2  URINE CULTURE  LACTIC ACID, PLASMA  URINALYSIS, ROUTINE W REFLEX MICROSCOPIC  APTT    EKG None  Radiology DG Lumbar Spine Complete  Result Date: 04/17/2022 CLINICAL DATA:  Trauma, fall EXAM: LUMBAR SPINE - COMPLETE 4+ VIEW COMPARISON:  None Available. FINDINGS: No recent fracture is seen. There is no significant disc space narrowing. There is minimal anterolisthesis at L4-L5 level. Degenerative changes are noted with small anterior bony spurs and facet hypertrophy more so in the lower lumbar spine. There is an electronic device in the subcutaneous plane in the anterior abdominal wall which is partly obscuring the bony structures in the 1 of the views. IMPRESSION: No recent fracture is seen in the lumbar spine. Lumbar spondylosis, more so in the facet joints in the lower lumbar spine. There is mild anterolisthesis at L4-L5 level. Electronically Signed   By: Ernie Avena M.D.   On: 04/17/2022 12:37    Procedures Procedures    Medications Ordered in ED Medications  lactated ringers infusion (has no administration in time range)  lactated ringers bolus 1,000 mL (1,000 mLs Intravenous New Bag/Given 04/17/22 1210)    And  lactated ringers bolus 1,000 mL (has no administration in time range)    And  lactated  ringers bolus 1,000 mL (has no administration in time range)  ceFEPIme (MAXIPIME) 2 g in sodium chloride 0.9 % 100 mL IVPB (2 g Intravenous New Bag/Given 04/17/22 1210)  metroNIDAZOLE (FLAGYL) IVPB 500 mg (has no administration in time range)  hydrocortisone sodium succinate (SOLU-CORTEF) 100 MG injection 100 mg (has no administration in time range)  vancomycin (VANCOREADY) IVPB 2000 mg/400 mL  (has no administration in time range)  fentaNYL (SUBLIMAZE) injection 50 mcg (has no administration in time range)    ED Course/ Medical Decision Making/ A&P Clinical Course as of 04/19/22 0907  Mon Apr 17, 2022  1243 Lactic acid, plasma(!!) Lactic acid level elevated [JK]  1243 Comprehensive metabolic panel(!) Creatinine elevated 1.73, liver function test elevated [JK]  1243 CBC with Differential(!) Leukocytosis noted [JK]  1243 DG Chest 1 View Chest x-ray shows progressive interstitial pattern.  Possible edema versus infection [JK]  1244 DG Lumbar Spine Complete No Spinal fracture noted [JK]  1244 DG Thoracic Spine 2 View No acute fracture noted [JK]  1330 CT Head Wo Contrast Head CT and C-spine CT without acute findings [JK]  1357 Case discussed with Dr Gwenlyn Perking [JK]    Clinical Course User Index [JK] Linwood Dibbles, MD                           Medical Decision Making Amount and/or Complexity of Data Reviewed Labs: ordered. Decision-making details documented in ED Course. Radiology: ordered. Decision-making details documented in ED Course. ECG/medicine tests: ordered.  Risk Prescription drug management. Decision regarding hospitalization.   Pt presented with acute weakness, found on the floor.  Pt found to be dehydrated, noted to have elevated lactic acid progressive pulm infiltrates.  Concerning for possible sepsis.  NO acute injuries noted from her fall.  IV fluids, abx started.  Admitted for further treatment.        Final Clinical Impression(s) / ED Diagnoses Final diagnoses:  Weakness  AKI (acute kidney injury) [facility])    Rx / DC Orders ED Discharge Orders     None         Linwood Dibbles, MD 04/19/22 [phone]

## 2022-04-17 NOTE — Assessment & Plan Note (Addendum)
-   Acute renal failure with a creatinine of 1.73 at time of admission. -Appears to be in the setting of dehydration, sepsis physiology, and rhabdomyolysis. -Renal ultrasound demonstrating no obstructive uropathy -Continue aggressive fluid resuscitation and minimize nephrotoxic agents. -had normal renal function prior to admission -nephrology following>>I notified them of the transfer to [facility]

## 2022-04-17 NOTE — Assessment & Plan Note (Addendum)
-  Patient on Neurontin and Nucynta at baseline>>stopped due to AMS -Home pain medication has also been adjusted. -Minimize extra sedative and analgesics agents.

## 2022-04-17 NOTE — Assessment & Plan Note (Addendum)
-  Usually 5 mg of prednisone on daily basis -Solu-Cortef x1 given while in the ED at time of admission. -Continue higher dose of prednisone 20 mg bid

## 2022-04-17 NOTE — Assessment & Plan Note (Addendum)
-  Broad-spectrum antibiotic has been initiated -Follow culture results and narrow coverage as needed -Continue aggressive fluid resuscitation. -urine culture with yeast, now on fluconazole D#2

## 2022-04-17 NOTE — Assessment & Plan Note (Addendum)
-  In the setting of acute infection; renal failure, hyponatremia -Continue constant reorientation -mental status worsening in past 48 hours -at baseline A&O x 4, uses cane/walker to ambulate -d/ced gaba, cefepime 6/29 -d/c nucynta -discussed with neurology, Dr. Gildardo Pounds to transfer for neuro consult -6/29 EEG--NO seizure activity during arm/leg jerking

## 2022-04-17 NOTE — Assessment & Plan Note (Addendum)
-  On chronic Eliquis -diagnosed on CTA 03/27/22 (last admission) -Continue anticoagulation.

## 2022-04-17 NOTE — Assessment & Plan Note (Addendum)
-  Patient met sepsis criteria at time of admission with elevated WBCs, tachycardia and low-grade temperature.  Organ dysfunction demonstrated by acute renal failure and a lactic acid of 4.7. -Continue broad-spectrum antibiotic and narrow as appropriate based on culture results. -Source of infection presumed to be either her lungs or UTI. -Continue aggressive fluid resuscitation -blood cultures neg -sepsis physiology resolved

## 2022-04-17 NOTE — Assessment & Plan Note (Addendum)
-  Patient receiving rituximab and methotrexate as an outpatient -Recently has no follow-up with rheumatology service and looking to be seen at [facility]. -Continue steroids for now. -Case discussed with Dr. Vassie Loll (PCCM) recommend continued treatment so far provided with outpatient follow-up as already arranged with rheumatology service at [facility].

## 2022-04-17 NOTE — Assessment & Plan Note (Addendum)
-  Most likely in the setting of sepsis/shock liver and rhabdomyolysis. -Continue aggressive fluid resuscitation and follow LFTs. -Avoid hepatotoxic agents. LFTs trending down

## 2022-04-18 ENCOUNTER — Inpatient Hospital Stay (HOSPITAL_COMMUNITY): Payer: BC Managed Care – PPO

## 2022-04-18 ENCOUNTER — Telehealth: Payer: Self-pay | Admitting: Family Medicine

## 2022-04-18 DIAGNOSIS — M6282 Rhabdomyolysis: Secondary | ICD-10-CM | POA: Diagnosis present

## 2022-04-18 DIAGNOSIS — G9341 Metabolic encephalopathy: Secondary | ICD-10-CM | POA: Diagnosis not present

## 2022-04-18 DIAGNOSIS — U071 COVID-19: Secondary | ICD-10-CM

## 2022-04-18 DIAGNOSIS — T796XXA Traumatic ischemia of muscle, initial encounter: Secondary | ICD-10-CM

## 2022-04-18 DIAGNOSIS — N179 Acute kidney failure, unspecified: Secondary | ICD-10-CM | POA: Diagnosis not present

## 2022-04-18 DIAGNOSIS — E2749 Other adrenocortical insufficiency: Secondary | ICD-10-CM

## 2022-04-18 DIAGNOSIS — A419 Sepsis, unspecified organism: Secondary | ICD-10-CM | POA: Diagnosis not present

## 2022-04-18 DIAGNOSIS — G8929 Other chronic pain: Secondary | ICD-10-CM

## 2022-04-18 DIAGNOSIS — N3000 Acute cystitis without hematuria: Secondary | ICD-10-CM | POA: Diagnosis not present

## 2022-04-18 LAB — GLUCOSE, CAPILLARY
Glucose-Capillary: 158 mg/dL — ABNORMAL HIGH (ref 70–99)
Glucose-Capillary: 153 mg/dL — ABNORMAL HIGH (ref 70–99)
Glucose-Capillary: 142 mg/dL — ABNORMAL HIGH (ref 70–99)
Glucose-Capillary: 251 mg/dL — ABNORMAL HIGH (ref 70–99)

## 2022-04-18 LAB — COMPREHENSIVE METABOLIC PANEL
ALT: 202 U/L — ABNORMAL HIGH (ref 0–44)
AST: 933 U/L — ABNORMAL HIGH (ref 15–41)
Albumin: 2.3 g/dL — ABNORMAL LOW (ref 3.5–5.0)
Alkaline Phosphatase: 76 U/L (ref 38–126)
Anion gap: 10 (ref 5–15)
BUN: 27 mg/dL — ABNORMAL HIGH (ref 6–20)
CO2: 18 mmol/L — ABNORMAL LOW (ref 22–32)
Calcium: 7.8 mg/dL — ABNORMAL LOW (ref 8.9–10.3)
Chloride: 106 mmol/L (ref 98–111)
Creatinine, Ser: 2.24 mg/dL — ABNORMAL HIGH (ref 0.44–1.00)
GFR, Estimated: 25 mL/min — ABNORMAL LOW (ref >60)
Glucose, Bld: 252 mg/dL — ABNORMAL HIGH (ref 70–99)
Potassium: 4.8 mmol/L (ref 3.5–5.1)
Sodium: 134 mmol/L — ABNORMAL LOW (ref 135–145)
Total Bilirubin: 0.5 mg/dL (ref 0.3–1.2)
Total Protein: 4.7 g/dL — ABNORMAL LOW (ref 6.5–8.1)

## 2022-04-18 LAB — CBC
HCT: 34.3 % — ABNORMAL LOW (ref 36.0–46.0)
Hemoglobin: 10.1 g/dL — ABNORMAL LOW (ref 12.0–15.0)
MCH: 26.2 pg (ref 26.0–34.0)
MCHC: 29.4 g/dL — ABNORMAL LOW (ref 30.0–36.0)
MCV: 88.9 fL (ref 80.0–100.0)
Platelets: 271 10*3/uL (ref 150–400)
RBC: 3.86 MIL/uL — ABNORMAL LOW (ref 3.87–5.11)
RDW: 21.2 % — ABNORMAL HIGH (ref 11.5–15.5)
WBC: 19.1 10*3/uL — ABNORMAL HIGH (ref 4.0–10.5)
nRBC: 0 % (ref 0.0–0.2)

## 2022-04-18 LAB — CK: Total CK: 26433 U/L — ABNORMAL HIGH (ref 38–234)

## 2022-04-18 LAB — PROCALCITONIN: Procalcitonin: 4.45 ng/mL

## 2022-04-18 LAB — C-REACTIVE PROTEIN: CRP: 16.2 mg/dL — ABNORMAL HIGH (ref <1.0)

## 2022-04-18 LAB — SEDIMENTATION RATE: Sed Rate: 28 mm/hr — ABNORMAL HIGH (ref 0–22)

## 2022-04-18 MED ORDER — LACTATED RINGERS IV SOLN: INTRAVENOUS | Status: AC

## 2022-04-18 MED ORDER — IPRATROPIUM-ALBUTEROL 0.5-2.5 (3) MG/3ML IN SOLN
3.0000 mL | Freq: Three times a day (TID) | RESPIRATORY_TRACT | Status: DC
  Administered 2022-04-19 – 2022-04-20 (×4): 3 mL via RESPIRATORY_TRACT
  Filled 2022-04-18 (×4): qty 3

## 2022-04-18 MED ORDER — IPRATROPIUM-ALBUTEROL 0.5-2.5 (3) MG/3ML IN SOLN
3.0000 mL | Freq: Four times a day (QID) | RESPIRATORY_TRACT | Status: DC
  Administered 2022-04-18 (×2): 3 mL via RESPIRATORY_TRACT
  Filled 2022-04-18: qty 3

## 2022-04-18 MED ORDER — TAPENTADOL HCL ER 50 MG PO TB12
50.0000 mg | ORAL_TABLET | Freq: Two times a day (BID) | ORAL | Status: DC
  Administered 2022-04-18 – 2022-04-21 (×8): 50 mg via ORAL
  Filled 2022-04-18 (×8): qty 1

## 2022-04-18 MED ORDER — CHLORHEXIDINE GLUCONATE CLOTH 2 % EX PADS
6.0000 | MEDICATED_PAD | Freq: Every day | CUTANEOUS | Status: DC
  Administered 2022-04-18 – 2022-05-10 (×23): 6 via TOPICAL

## 2022-04-18 MED ORDER — GABAPENTIN 100 MG PO CAPS
100.0000 mg | ORAL_CAPSULE | Freq: Three times a day (TID) | ORAL | Status: DC
  Administered 2022-04-18 – 2022-04-20 (×5): 100 mg via ORAL
  Filled 2022-04-18 (×5): qty 1

## 2022-04-18 NOTE — Telephone Encounter (Signed)
Attempted to contact pt back, unable to reach her.

## 2022-04-18 NOTE — Assessment & Plan Note (Addendum)
-   COVID PCR positive at time of admission -Inflammatory markers suggesting bacterial infection and very likely component of underlying rheumatologic condition. -PCT 4.45>>4.17>>2.95 -Continue stress dose steroids -No remdesivir or antiviral in the setting of acute liver dysfunction -has remained stable on RA the entire hospitalization

## 2022-04-18 NOTE — Assessment & Plan Note (Addendum)
-   Down on ground for multiple hours prior to be found -Very likely some muscular trauma with fall; images not demonstrating fractures. -initial CK [phone] -Continue aggressive fluid resuscitation and follow CK level.

## 2022-04-18 NOTE — TOC Initial Note (Signed)
Transition of Care [facility]) - Initial/Assessment Note    Patient Details  Name: [name] MRN: [id number] Date of Birth: [birth year]  [facility] CM/SW Contact:    Villa Herb, LCSWA Phone Number: 04/18/2022, 4:14 PM  Clinical Narrative:                 Pt is high risk for readmission. CSW spoke with pt to complete assessment. Pt states that she lives with her husband. Pt states her husband is able to assist with ADLs when needed. Pts husband or father provide transportation to appointments when needed. Pt was set up with [facility] at D/C from [facility] on 6/14. Pt has a cane, walker, wheelchair and scooter to use when needed. TOC to follow.   Expected Discharge Plan: Home w Home Health Services Barriers to Discharge: Continued Medical Work up   Patient Goals and CMS Choice Patient states their goals for this hospitalization and ongoing recovery are:: return home CMS Medicare.gov Compare Post Acute Care list provided to:: Patient Choice offered to / list presented to : Patient  Expected Discharge Plan and Services Expected Discharge Plan: Home w Home Health Services In-house Referral: Clinical Social Work Discharge Planning Services: CM Consult   Living arrangements for the past 2 months: Single Family Home                                      Prior Living Arrangements/Services Living arrangements for the past 2 months: Single Family Home Lives with:: Spouse Patient language and need for interpreter reviewed:: Yes Do you feel safe going back to the place where you live?: Yes      Need for Family Participation in Patient Care: Yes (Comment) Care giver support system in place?: Yes (comment) Current home services: DME (cane, walker, wheelchair and scooter) Criminal Activity/Legal Involvement Pertinent to Current Situation/Hospitalization: No - Comment as needed  Activities of Daily Living Home Assistive Devices/Equipment: Bedside commode/3-in-1,  Electric scooter, Built-in shower seat, Environmental consultant (specify type), Shower chair with back, Cane (specify quad or straight) ADL Screening (condition at time of admission) Patient's cognitive ability adequate to safely complete daily activities?: Yes Is the patient deaf or have difficulty hearing?: No Does the patient have difficulty seeing, even when wearing glasses/contacts?: No Does the patient have difficulty concentrating, remembering, or making decisions?: Yes Patient able to express need for assistance with ADLs?: Yes Does the patient have difficulty dressing or bathing?: Yes Independently performs ADLs?: No Communication: Independent Dressing (OT): Needs assistance Is this a change from baseline?: Pre-admission baseline Grooming: Needs assistance Is this a change from baseline?: Pre-admission baseline Feeding: Independent Bathing: Needs assistance Is this a change from baseline?: Pre-admission baseline Toileting: Needs assistance Is this a change from baseline?: Pre-admission baseline In/Out Bed: Needs assistance Is this a change from baseline?: Pre-admission baseline Walks in Home: Needs assistance Is this a change from baseline?: Pre-admission baseline Does the patient have difficulty walking or climbing stairs?: Yes Weakness of Legs: Both Weakness of Arms/Hands: None  Permission Sought/Granted                  Emotional Assessment Appearance:: Appears stated age Attitude/Demeanor/Rapport: Engaged Affect (typically observed): Accepting Orientation: : Oriented to Self, Oriented to Place, Oriented to  Time, Oriented to Situation Alcohol / Substance Use: Not Applicable Psych Involvement: No (comment)  Admission diagnosis:  Weakness [R53.1] AKI (acute kidney injury) (HCC) [  N17.9] Severe sepsis (HCC) [A41.9, R65.20] 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, 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, 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] Patient Active Problem List   Diagnosis Date Noted   Transaminitis 04/17/2022    Recurrent pneumonia 04/12/2022   Pulmonary embolism (HCC) 03/28/2022   Severe sepsis (HCC) 03/26/2022   UTI (urinary tract infection) 03/26/2022   Opioid dependence (HCC) 03/26/2022   Acute metabolic encephalopathy 03/26/2022   Hip pain 02/20/2022   Arthritis of right hip 02/19/2022   Hypothyroidism    AKI (acute kidney injury) (HCC)    Class 2 obesity    Depression    Essential hypertension    Acute cystitis without hematuria 01/04/2022   Dysfunction of left rotator cuff 01/04/2022   Lobar pneumonia (HCC) 01/04/2022   Acquired absence of other toe(s), unspecified side (HCC) 08/01/2021   Myositis of right lower extremity 07/01/2021   Tear of gluteus medius tendon 07/01/2021   Left wrist pain 06/01/2021   Medication monitoring encounter 05/10/2021   Chronic pain 03/24/2021   Stage 3a chronic kidney disease (HCC) 09/08/2019   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   Steroid-induced hyperglycemia 05/11/2018   Morbid obesity (HCC) 01/25/2018   Neuropathy 01/25/2018   Presence of intrathecal pump 08/02/2017   Rectocele 03/09/2017   Abnormal finding on imaging 12/19/2016   Esophageal dysphagia 12/19/2016   Current chronic use of systemic steroids 10/26/2016   IFG (impaired fasting glucose) 10/26/2016   Arthralgia of multiple joints 07/15/2016   Headache 07/15/2016   Peripheral neuropathy 07/04/2016   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   Neuropathic pain of both feet 01/11/2016   OSA (obstructive sleep apnea) 12/29/2015   Chronic insomnia 12/29/2015   Obesity, Class II, BMI 35-39.9, with comorbidity 12/29/2015   Polyneuropathy associated with underlying disease (HCC) 12/29/2015   Class 2 drug-induced obesity with serious comorbidity and body mass index (BMI) of 35.0 to 35.9 in adult 12/29/2015   Other mechanical complication of implanted electronic neurostimulator of spinal cord electrode (lead), initial encounter (HCC) 08/04/2015   Adjustment  disorder with depressed mood 03/29/2015   Mononeuritis 07/20/2014   Obesity (BMI 30-39.9) 07/20/2014   Neurocardiogenic syncope 07/06/2014   Normocytic anemia 06/08/2014   POTS (postural orthostatic tachycardia syndrome) 06/08/2014   Encounter for long-term (current) use of high-risk medication 05/30/2013   Fall 05/30/2013   Other long term (current) drug therapy 05/30/2013   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/30/2013   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/2013   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   Congenital syphilitic osteochondritis    GERD (gastroesophageal reflux disease) 03/07/2012   Other diseases of trachea and bronchus 08/30/2011   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   Atrophy of nasal turbinates 07/25/2011   Nasal septal perforation 07/25/2011   Cough 03/24/2011   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   Subglottic stenosis 03/24/2011   Granulomatosis with polyangiitis (HCC) 2009   PCP:  [name], [name] Pharmacy:   [facility] - [place], [place] - [address] [place] [address] Phone: [phone] Fax: [phone]     Social Determinants of Health (SDOH) Interventions    Readmission Risk Interventions    04/18/2022    4:11 PM 04/05/2022    2:33 PM 03/27/2022   10:12 AM  Readmission Risk Prevention Plan  Transportation Screening Complete Complete Complete  Medication Review (RN Care Manager) Complete Complete Complete  PCP or Specialist appointment within 3-5 days of discharge Complete Complete   HRI or Home Care Consult Complete Complete Complete  SW Recovery Care/Counseling Consult Complete Complete Complete  Palliative Care Screening Not Applicable Not Applicable Not Applicable  Skilled Nursing Facility Not Applicable Not Applicable Not Applicable

## 2022-04-18 NOTE — Progress Notes (Signed)
Patient has not voided since 2PM, bladder scan done= . In & out cath done as per MD. Drained= .

## 2022-04-19 DIAGNOSIS — U071 COVID-19: Secondary | ICD-10-CM | POA: Diagnosis not present

## 2022-04-19 DIAGNOSIS — N179 Acute kidney failure, unspecified: Secondary | ICD-10-CM | POA: Diagnosis not present

## 2022-04-19 DIAGNOSIS — A419 Sepsis, unspecified organism: Secondary | ICD-10-CM | POA: Diagnosis not present

## 2022-04-19 DIAGNOSIS — G9341 Metabolic encephalopathy: Secondary | ICD-10-CM | POA: Diagnosis not present

## 2022-04-19 LAB — RENAL FUNCTION PANEL
Albumin: 2 g/dL — ABNORMAL LOW (ref 3.5–5.0)
Anion gap: 12 (ref 5–15)
BUN: 43 mg/dL — ABNORMAL HIGH (ref 6–20)
CO2: 15 mmol/L — ABNORMAL LOW (ref 22–32)
Calcium: 7.1 mg/dL — ABNORMAL LOW (ref 8.9–10.3)
Chloride: 102 mmol/L (ref 98–111)
Creatinine, Ser: 3.68 mg/dL — ABNORMAL HIGH (ref 0.44–1.00)
GFR, Estimated: 14 mL/min — ABNORMAL LOW (ref >60)
Glucose, Bld: 187 mg/dL — ABNORMAL HIGH (ref 70–99)
Phosphorus: 4.7 mg/dL — ABNORMAL HIGH (ref 2.5–4.6)
Potassium: 5.4 mmol/L — ABNORMAL HIGH (ref 3.5–5.1)
Sodium: 129 mmol/L — ABNORMAL LOW (ref 135–145)

## 2022-04-19 LAB — URINE CULTURE: Culture: >=100000 — AB

## 2022-04-19 LAB — GLUCOSE, CAPILLARY
Glucose-Capillary: 139 mg/dL — ABNORMAL HIGH (ref 70–99)
Glucose-Capillary: 178 mg/dL — ABNORMAL HIGH (ref 70–99)
Glucose-Capillary: 132 mg/dL — ABNORMAL HIGH (ref 70–99)
Glucose-Capillary: 158 mg/dL — ABNORMAL HIGH (ref 70–99)

## 2022-04-19 LAB — CREATININE, SERUM
Creatinine, Ser: 3.57 mg/dL — ABNORMAL HIGH (ref 0.44–1.00)
GFR, Estimated: 14 mL/min — ABNORMAL LOW (ref >60)

## 2022-04-19 LAB — PROCALCITONIN: Procalcitonin: 4.17 ng/mL

## 2022-04-19 MED ORDER — LINEZOLID 600 MG/300ML IV SOLN
600.0000 mg | Freq: Two times a day (BID) | INTRAVENOUS | Status: DC
  Administered 2022-04-19 – 2022-04-22 (×7): 600 mg via INTRAVENOUS
  Filled 2022-04-19 (×9): qty 300

## 2022-04-19 MED ORDER — CEFEPIME HCL 2 G IV SOLR
2.0000 g | INTRAVENOUS | Status: DC
Start: 2022-04-20 — End: 2022-04-20
  Administered 2022-04-20: 2 g via INTRAVENOUS
  Filled 2022-04-19: qty 12.5

## 2022-04-19 MED ORDER — STERILE WATER FOR INJECTION IV SOLN
INTRAVENOUS | Status: DC
  Filled 2022-04-19 (×10): qty 1000

## 2022-04-19 NOTE — Progress Notes (Signed)
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  Symptoms have been present for the last 2-3 days and worsening according to patient's husband.  Patient has just completed her antibiotic therapy from most recent admission about 1 week prior to presentation.  The patient was recently admitted to the hospital from 03/26/2022 to 04/05/2022 when she was treated for pneumonia.  She also had an acute pulmonary embolus for which she was treated with apixaban.  She had instructions to continue vancomycin until 04/08/2022.  The patient was followed by infectious disease.  She had a bronchoscopy on 03/30/2022.  Tissue cultures grew Candida, but her clinical presentation was not consistent and the patient was improving without antifungal therapy.  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  This was felt to be also a contaminant. Per patient's husband he went to work and when he contacted patient she did not pick up the phone; on his arrival to home he found patient on the floor confused and unable to stand up.  Unclear if she tripped and fell, fell off her feet from ongoing weakness and deconditioning or ended passing out. In the ED patient met criteria for severe sepsis with elevated WBCs, elevated lactic acid in the 4.7 range, acute renal failure as organ dysfunction and was also tachycardic and tachypneic   Assessment/Plan: * Severe sepsis ([place]) - Patient met sepsis criteria at time of admission with elevated WBCs, tachycardia and low-grade  temperature.  Organ dysfunction demonstrated by acute renal failure and a lactic acid of 4.7. -Continue broad-spectrum antibiotic and narrow as appropriate based on culture results. -Source of infection presumed to be either her lungs or UTI. -Continue aggressive fluid resuscitation -Follow inflammatory markers. -continue cefepime;  d/c vanc (renal failure)--start zyvox  AKI -due to hemodynamic changes and rhabdomyolysis -appreciate nephrology -continue bicarb drip Baseline creatinine 0.8-1.1 Monitor with serial BMP   Acute cystitis without hematuria -Broad-spectrum antibiotic has been initiated -Follow culture results and narrow coverage as needed -Continue aggressive fluid resuscitation. -culture = yeast, suspect colonization   Secondary adrenal insufficiency ([place]) -With mild crisis most likely in the setting of acute infection -Continue higher dosages of prednisone to provide stress dose steroids.   Chemotherapy-induced peripheral neuropathy ([place]) Patient is on gabapentin, Cymbalta, amitriptyline topiramate, trazodone, nucynta   Current chronic use of systemic steroids -Usually 5 mg of prednisone on daily basis -Continue higher dose of prednisone.   AKI (acute kidney injury) ([place]) - Acute renal failure with a creatinine of 1.73 at time of admission. -Appears to be in the setting of dehydration, sepsis physiology, UTI and rhabdomyolysis. -Renal ultrasound demonstrating no obstructive uropathy -Continue aggressive fluid resuscitation and minimize nephrotoxic agents. -Avoid contrast and hypotension. -Previous creatinine over the last 2-3 weeks were within normal range prior to admission. -Per chart review patient with stage II chronic renal failure most likely in the setting of diabetes. -Continue to follow renal function trend.   Granulomatosis with polyangiitis [facility]) Patient previously follows with [facility]ology at [facility], Dr. Allean Found She is on methotrexate every  Sunday Rituximab currently on hold secondary to recent sepsis She is chronically  on prednisone 5 mg daily -Case discussed with Dr. Elsworth Soho (PCCM) recommend continued treatment so far provided with outpatient follow-up as already arranged with rheumatology service at [facility].   GERD (gastroesophageal reflux disease) -Continue PPI.   Essential hypertension -Continue holding home antihypertensive agents in the setting of acute sepsis and worsening renal function -Blood pressure stable at this point. -Heart healthy diet ordered.   Rhabdomyolysis - Down for multiple hours prior to be found by spouse -Very likely some muscular trauma with fall; images not demonstrating fractures. -Continue aggressive fluid resuscitation and follow CK level. -CK peaking 26233   Transaminitis -Most likely in the setting of sepsis/shock liver and rhabdomyolysis. -Continue aggressive fluid resuscitation and follow LFTs. -Avoid hepatotoxic agents.   Pulmonary embolism (HCC) -On chronic Eliquis -Continue anticoagulation.   Acute metabolic encephalopathy -In the setting of acute infection -Continue constant reorientation -Currently oriented x3 -Minimize sedative/analgesics agents.   COVID-19 virus infection - COVID PCR positive at time of admission -Inflammatory markers suggesting bacterial infection -PCT 4.45>>4.17 -Continue steroids -No remdesivir or antiviral in the setting of acute liver dysfunction -Airborne contact precautions/isolation instructed.   Obesity (BMI 30-39.9) -Class I obesity -Body mass index is 33.38 kg/m. -Low calorie diet and portion control discussed patient.  Opioid dependence ([place]) Patient has intrathecal pump with bupivacaine and ziconotide,  --Last refill 03/02/2022 PDMP reviewed -- Patient receives Nucynta 75 mg, #120; last refill 03/17/2022 -- Patient is also on oxycodone 10 mg; #30, last refilled 02/23/2022        Family Communication:   spouse updated  6/28  Consultants:  renal  Code Status:  FULL   DVT Prophylaxis:  apixaban   Procedures: As Listed in Progress Note Above  Antibiotics: Zyvox 6/28>> Cefepime 6/28        Subjective: Patient complains of some dyspnea on exertion.  Denies cp, n/v/d, abd pain  Objective: Vitals:   04/19/22 1300 04/19/22 1331 04/19/22 1600 04/19/22 1700  BP: 135/78  120/70 126/70  Pulse: 83  78 81  Resp: $Remo'20  19 19  'IQsIB$ Temp:   97.8 F (36.6 C)   TempSrc:   Oral   SpO2: 100% 98% 99% 98%  Weight:      Height:        Intake/Output Summary (Last 24 hours) at 04/19/2022 1805 Last data filed at 04/19/2022 1640 Gross per 24 hour  Intake 2811.99 ml  Output 510 ml  Net 2301.99 ml   Weight change: 0 kg Exam:  General:  Pt is alert, follows commands appropriately, not in acute distress HEENT: No icterus, No thrush, No neck mass, [place]/AT Cardiovascular: RRR, S1/S2, no rubs, no gallops Respiratory: scattered bilateral rales.  No wheeze Abdomen: Soft/+BS, non tender, non distended, no guarding Extremities: trace LE edema, No lymphangitis, No petechiae, No rashes, no synovitis   Data Reviewed: I have personally reviewed following labs and imaging studies Basic Metabolic Panel: Recent Labs  Lab 04/17/22 1124 04/18/22 0326 04/19/22 0333  NA 136 134* 129*  K 4.7 4.8 5.4*  CL 106 106 102  CO2 17* 18* 15*  GLUCOSE 253* 252* 187*  BUN 14 27* 43*  CREATININE 1.73* 2.24* 3.68*  3.57*  CALCIUM 8.7* 7.8* 7.1*  PHOS  --   --  4.7*   Liver Function Tests: Recent Labs  Lab 04/17/22 1124 04/18/22 0326 04/19/22 0333  AST 202* 933*  --   ALT 54* 202*  --   ALKPHOS 103 76  --   BILITOT 0.7 0.5  --  PROT 5.9* 4.7*  --   ALBUMIN 2.9* 2.3* 2.0*   No results for input(s): "LIPASE", "AMYLASE" in the last 168 hours. No results for input(s): "AMMONIA" in the last 168 hours. Coagulation Profile: Recent Labs  Lab 04/17/22 1124  INR 1.8*   CBC: Recent Labs  Lab 04/17/22 1124  04/18/22 0326  WBC 24.5* 19.1*  NEUTROABS 18.4*  --   HGB 11.2* 10.1*  HCT 38.1 34.3*  MCV 90.5 88.9  PLT 286 271   Cardiac Enzymes: Recent Labs  Lab 04/18/22 1300  CKTOTAL 26,433*   BNP: Invalid input(s): "POCBNP" CBG: Recent Labs  Lab 04/18/22 1701 04/18/22 2140 04/19/22 0826 04/19/22 1200 04/19/22 1637  GLUCAP 142* 251* 139* 178* 132*   HbA1C: No results for input(s): "HGBA1C" in the last 72 hours. Urine analysis:    Component Value Date/Time   COLORURINE AMBER (A) 04/17/2022 1115   APPEARANCEUR CLOUDY (A) 04/17/2022 1115   LABSPEC 1.019 04/17/2022 1115   PHURINE 5.0 04/17/2022 1115   GLUCOSEU 50 (A) 04/17/2022 1115   HGBUR LARGE (A) 04/17/2022 1115   BILIRUBINUR NEGATIVE 04/17/2022 1115   KETONESUR NEGATIVE 04/17/2022 1115   PROTEINUR 100 (A) 04/17/2022 1115   UROBILINOGEN 0.2 05/30/2013 1112   NITRITE NEGATIVE 04/17/2022 1115   LEUKOCYTESUR LARGE (A) 04/17/2022 1115   Sepsis Labs: $RemoveBefo'@[email or web address]'[id number]$ (procalcitonin:4,lacticidven:4) ) Recent Results (from the past 240 hour(s))  Culture, blood (Routine x 2)     Status: None (Preliminary result)   Collection Time: 04/17/22 11:25 AM   Specimen: Left Antecubital; Blood  Result Value Ref Range Status   Specimen Description LEFT ANTECUBITAL  Final   Special Requests   Final    BOTTLES DRAWN AEROBIC AND ANAEROBIC Blood Culture results may not be optimal due to an inadequate volume of blood received in culture bottles   Culture   Final    NO GROWTH 2 DAYS Performed at [facility], [address], [place], [place] [address]    Report Status PENDING  Incomplete  Culture, blood (Routine x 2)     Status: None (Preliminary result)   Collection Time: 04/17/22 11:34 AM   Specimen: BLOOD LEFT ARM  Result Value Ref Range Status   Specimen Description BLOOD LEFT ARM  Final   Special Requests   Final    BOTTLES DRAWN AEROBIC AND ANAEROBIC Blood Culture adequate volume   Culture   Final    NO GROWTH 2 DAYS Performed at  [facility], [address], [place], [place] [address]    Report Status PENDING  Incomplete  Urine Culture     Status: Abnormal   Collection Time: 04/17/22 11:53 AM   Specimen: Urine, Catheterized  Result Value Ref Range Status   Specimen Description   Final    URINE, CATHETERIZED Performed at [facility] : [facility], [address] [place], [place] [address]    Special Requests   Final    NONE Performed at [facility], [address], [place],  [address]    Culture >=100,000 COLONIES/mL YEAST (A)  Final   Report Status 04/19/2022 FINAL  Final  Resp Panel by RT-PCR (Flu A&B, Covid) Anterior Nasal Swab     Status: Abnormal   Collection Time: 04/17/22 11:57 AM   Specimen: Anterior Nasal Swab  Result Value Ref Range Status   SARS Coronavirus 2 by RT PCR POSITIVE (A) NEGATIVE Final    Comment: (NOTE) SARS-CoV-2 target nucleic acids are DETECTED.  The SARS-CoV-2 RNA is generally detectable in upper respiratory specimens during the acute  phase of infection. Positive results are indicative of the presence of the identified virus, but do not rule out bacterial infection or co-infection with other pathogens not detected by the test. Clinical correlation with patient history and other diagnostic information is necessary to determine patient infection status. The expected result is Negative.  Fact Sheet for Patients: EntrepreneurPulse.com.au  Fact Sheet for Healthcare Providers: IncredibleEmployment.be  This test is not yet approved or cleared by the [place] FDA and  has been authorized for detection and/or diagnosis of SARS-CoV-2 by FDA under an Emergency Use Authorization (EUA).  This EUA will remain in effect (meaning this test can be used) for the duration of  the COVID-19 declaration under Section 564(b)(1) of the A ct, 21 U.S.C. section 360bbb-3(b)(1), unless the authorization is terminated or revoked sooner.     Influenza A by PCR  NEGATIVE NEGATIVE Final   Influenza B by PCR NEGATIVE NEGATIVE Final    Comment: (NOTE) The Xpert Xpress SARS-CoV-2/FLU/RSV plus assay is intended as an aid in the diagnosis of influenza from Nasopharyngeal swab specimens and should not be used as a sole basis for treatment. Nasal washings and aspirates are unacceptable for Xpert Xpress SARS-CoV-2/FLU/RSV testing.  Fact Sheet for Patients: EntrepreneurPulse.com.au  Fact Sheet for Healthcare Providers: IncredibleEmployment.be  This test is not yet approved or cleared by the [place] FDA and has been authorized for detection and/or diagnosis of SARS-CoV-2 by FDA under an Emergency Use Authorization (EUA). This EUA will remain in effect (meaning this test can be used) for the duration of the COVID-19 declaration under Section 564(b)(1) of the Act, 21 U.S.C. section 360bbb-3(b)(1), unless the authorization is terminated or revoked.  Performed at [facility], [address] [place], [place] [address]      Scheduled Meds:  apixaban  5 mg Oral BID   Chlorhexidine Gluconate Cloth  6 each Topical Daily   DULoxetine  60 mg Oral BID   folic acid  2 mg Oral Daily   gabapentin  100 mg Oral TID   guaiFENesin-dextromethorphan  10 mL Oral Q12H   insulin aspart  0-15 Units Subcutaneous TID WC   insulin glargine-yfgn  10 Units Subcutaneous QHS   ipratropium-albuterol  3 mL Nebulization TID   linaclotide  145 mcg Oral QAC breakfast   magnesium oxide  400 mg Oral Daily   pantoprazole  40 mg Oral Daily   predniSONE  20 mg Oral BID WC   senna  2 tablet Oral BID   tapentadol  50 mg Oral Q12H   vitamin B-12  1,000 mcg Oral Daily   Continuous Infusions:  [START ON 04/20/2022] ceFEPime (MAXIPIME) IV     linezolid (ZYVOX) IV Stopped (04/19/22 1206)   sodium bicarbonate 150 mEq in sterile water 1,150 mL infusion 200 mL/hr at 04/19/22 1400    Procedures/Studies: US RENAL  Result Date:  05/14/2022 CLINICAL DATA:  Renal failure EXAM: RENAL / URINARY TRACT ULTRASOUND COMPLETE COMPARISON:  CT 07/09/2016 FINDINGS: Right Kidney: Renal measurements: 10.3 x 6.4 x 6.0 cm = volume: 207 mL. Echogenicity within normal limits. No mass or hydronephrosis visualized. Left Kidney: Renal measurements: 10.9 x 6.2 x 4.7 cm = volume: 166 mL. Echogenicity within normal limits. No mass or hydronephrosis visualized. Bladder: Decompressed by Foley catheter. Other: None. IMPRESSION: No evidence of obstructive uropathy. Electronically Signed   By: Davina Poke D.O.   On: 2022-05-14 14:05   CT Head Wo Contrast  Result Date: 04/17/2022 CLINICAL DATA:  Mental status changes, increased generalized  weakness, incontinence, unknown etiology; history diabetes mellitus, Wegner's granulomatosis, essential hypertension EXAM: CT HEAD WITHOUT CONTRAST CT CERVICAL SPINE WITHOUT CONTRAST TECHNIQUE: Multidetector CT imaging of the head and cervical spine was performed following the standard protocol without intravenous contrast. Multiplanar CT image reconstructions of the cervical spine were also generated. RADIATION DOSE REDUCTION: This exam was performed according to the departmental dose-optimization program which includes automated exposure control, adjustment of the mA and/or kV according to patient size and/or use of iterative reconstruction technique. COMPARISON:  CT head 03/26/2022 FINDINGS: CT HEAD FINDINGS Brain: Asymmetry of the lateral ventricles, LEFT larger than RIGHT, little changed from previous exam. No midline shift or mass effect. 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 No intracranial hemorrhage, mass lesion, or evidence of acute infarction. No extra-axial fluid collections. Vascular: No hyperdense vessels Skull: Hyperostosis frontalis interna.  Calvaria intact. Sinuses/Orbits: Clear Other: N/A CT CERVICAL SPINE FINDINGS Alignment: Normal Skull base and vertebrae: Osseous  mineralization normal. Scattered facet degenerative changes. Mild disc space narrowing and endplate spur formation at C5-C6. No fracture, subluxation, or bone destruction. Visualized skull base intact. Soft tissues and spinal canal: Prevertebral soft tissues normal thickness. Disc levels:  No additional abnormalities Upper chest: Tips of lung apices clear Other: N/A IMPRESSION: No acute intracranial abnormalities. 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 Degenerative disc and facet disease changes of the cervical spine. No acute cervical spine abnormalities. Electronically Signed   By: Lavonia Dana M.D.   On: 04/17/2022 12:41   CT Cervical Spine Wo Contrast  Result Date: 04/17/2022 CLINICAL DATA:  Mental status changes, increased generalized weakness, incontinence, unknown etiology; history diabetes mellitus, Wegner's granulomatosis, essential hypertension EXAM: CT HEAD WITHOUT CONTRAST CT CERVICAL SPINE WITHOUT CONTRAST TECHNIQUE: Multidetector CT imaging of the head and cervical spine was performed following the standard protocol without intravenous contrast. Multiplanar CT image reconstructions of the cervical spine were also generated. RADIATION DOSE REDUCTION: This exam was performed according to the departmental dose-optimization program which includes automated exposure control, adjustment of the mA and/or kV according to patient size and/or use of iterative reconstruction technique. COMPARISON:  CT head 03/26/2022 FINDINGS: CT HEAD FINDINGS Brain: Asymmetry of the lateral ventricles, LEFT larger than RIGHT, little changed from previous exam. No midline shift or mass effect. 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 No intracranial hemorrhage, mass lesion, or evidence of acute infarction. No extra-axial fluid collections. Vascular: No hyperdense vessels Skull: Hyperostosis frontalis interna.  Calvaria intact. Sinuses/Orbits: Clear Other: N/A CT CERVICAL  SPINE FINDINGS Alignment: Normal Skull base and vertebrae: Osseous mineralization normal. Scattered facet degenerative changes. Mild disc space narrowing and endplate spur formation at C5-C6. No fracture, subluxation, or bone destruction. Visualized skull base intact. Soft tissues and spinal canal: Prevertebral soft tissues normal thickness. Disc levels:  No additional abnormalities Upper chest: Tips of lung apices clear Other: N/A IMPRESSION: No acute intracranial abnormalities. 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 Degenerative disc and facet disease changes of the cervical spine. No acute cervical spine abnormalities. Electronically Signed   By: Lavonia Dana M.D.   On: 04/17/2022 12:41   DG Thoracic Spine 2 View  Result Date: 04/17/2022 CLINICAL DATA:  Trauma, fall, pain EXAM: THORACIC SPINE 2 VIEWS COMPARISON:  None Available. FINDINGS: No recent fracture is seen. There is mild dextroscoliosis in the upper thoracic spine. Alignment of posterior margins of vertebral bodies is unremarkable. Degenerative changes are noted with anterior bony spurs at multiple levels.  Paraspinal soft tissues are unremarkable. IMPRESSION: No recent fracture is seen in the thoracic spine. Degenerative changes are noted with anterior bony spurs. Electronically Signed   By: Elmer Picker M.D.   On: 04/17/2022 12:38   DG Chest 1 View  Result Date: 04/17/2022 CLINICAL DATA:  Granulomatosis with polyangiitis. EXAM: CHEST  1 VIEW COMPARISON:  One-view chest x-ray 04/03/2022. FINDINGS: Heart size exaggerated by low lung volumes. Progressive interstitial pattern involves the left lung and right upper lobe. Surgical clips are present in the abdomen. IMPRESSION: Progressive interstitial pattern left lung and right upper lobe. This may represent edema or infection. Electronically Signed   By: San Morelle M.D.   On: 04/17/2022 12:38   DG Lumbar Spine Complete  Result Date: 04/17/2022 CLINICAL DATA:   Trauma, fall EXAM: LUMBAR SPINE - COMPLETE 4+ VIEW COMPARISON:  None Available. FINDINGS: No recent fracture is seen. There is no significant disc space narrowing. There is minimal anterolisthesis at L4-L5 level. Degenerative changes are noted with small anterior bony spurs and facet hypertrophy more so in the lower lumbar spine. There is an electronic device in the subcutaneous plane in the anterior abdominal wall which is partly obscuring the bony structures in the 1 of the views. IMPRESSION: No recent fracture is seen in the lumbar spine. Lumbar spondylosis, more so in the facet joints in the lower lumbar spine. There is mild anterolisthesis at L4-L5 level. Electronically Signed   By: Elmer Picker M.D.   On: 04/17/2022 12:37   DG Foot 2 Views Right  Result Date: 04/03/2022 CLINICAL DATA:  Right foot ulcer EXAM: RIGHT FOOT - 2 VIEW COMPARISON:  None Available. FINDINGS: No acute fracture or dislocation. Prior transmetatarsal amputation. Small plantar calcaneal spur. Enthesopathic changes of the Achilles tendon insertion. No aggressive osseous lesion. Normal alignment. Soft tissue are unremarkable. No radiopaque foreign body or soft tissue emphysema. IMPRESSION: 1. Prior transmetatarsal amputation. 2. No evidence of osteomyelitis of the right foot. Electronically Signed   By: Kathreen Devoid M.D.   On: 04/03/2022 15:45   DG Chest Port 1 View  Result Date: 04/03/2022 CLINICAL DATA:  Severe sepsis, shortness of breath EXAM: PORTABLE CHEST 1 VIEW COMPARISON:  Radiograph 03/30/2022 FINDINGS: Note that the exam is mislabeled right versus left. There is a right upper extremity PICC with tip overlying the distal superior vena cava. Unchanged cardiomediastinal silhouette. Interval improvement in bilateral airspace opacities with faint residual opacity seen in the right lung. No large effusion. No pneumothorax. No acute osseous abnormality. IMPRESSION: Interval improvement in bilateral airspace disease, with  mild residual opacities in the right lung. Electronically Signed   By: Maurine Simmering M.D.   On: 04/03/2022 08:36   [place] EKG SITE RITE  Result Date: 03/31/2022 If Site Rite image not attached, placement could not be confirmed due to current cardiac rhythm.  VAS [place] LOWER EXTREMITY VENOUS (DVT)  Result Date: 03/30/2022  Lower Venous DVT Study Patient Name:  RAKEB KIBBLE  Date of Exam:   03/30/2022 Medical Rec #: [id number]    Accession #:    [id number] Date of Birth: 25-Jul-1966    Patient Gender: F Patient Age:   3 years Exam Location:  [facility] Procedure:      VAS [place] LOWER EXTREMITY VENOUS (DVT) Referring Phys: Deno Etienne [facility] --------------------------------------------------------------------------------  Indications: Swelling, pulmonary embolism, and Elevated D-dimer.  Performing Technologist: Bobetta Lime  Examination Guidelines: A complete evaluation includes B-mode imaging, spectral Doppler, color Doppler, and power Doppler as needed of all accessible  portions of each vessel. Bilateral testing is considered an integral part of a complete examination. Limited examinations for reoccurring indications may be performed as noted. The reflux portion of the exam is performed with the patient in reverse Trendelenburg.  +---------+---------------+---------+-----------+----------+--------------+ RIGHT    CompressibilityPhasicitySpontaneityPropertiesThrombus Aging +---------+---------------+---------+-----------+----------+--------------+ CFV      Full           Yes      Yes                                 +---------+---------------+---------+-----------+----------+--------------+ SFJ      Full                                                        +---------+---------------+---------+-----------+----------+--------------+ FV Prox  Full                                                        +---------+---------------+---------+-----------+----------+--------------+ FV Mid   Full                                                         +---------+---------------+---------+-----------+----------+--------------+ FV DistalFull                                                        +---------+---------------+---------+-----------+----------+--------------+ PFV      Full                                                        +---------+---------------+---------+-----------+----------+--------------+ POP      Full           Yes      Yes                                 +---------+---------------+---------+-----------+----------+--------------+ PTV      Full                                                        +---------+---------------+---------+-----------+----------+--------------+ PERO     Full                                                        +---------+---------------+---------+-----------+----------+--------------+   +---------+---------------+---------+-----------+----------+--------------+ LEFT  CompressibilityPhasicitySpontaneityPropertiesThrombus Aging +---------+---------------+---------+-----------+----------+--------------+ CFV      Full           Yes      Yes                                 +---------+---------------+---------+-----------+----------+--------------+ SFJ      Full                                                        +---------+---------------+---------+-----------+----------+--------------+ FV Prox  Full                                                        +---------+---------------+---------+-----------+----------+--------------+ FV Mid   Full                                                        +---------+---------------+---------+-----------+----------+--------------+ FV DistalFull                                                        +---------+---------------+---------+-----------+----------+--------------+ PFV      Full                                                         +---------+---------------+---------+-----------+----------+--------------+ POP      Full           Yes      Yes                                 +---------+---------------+---------+-----------+----------+--------------+ PTV      Full                                                        +---------+---------------+---------+-----------+----------+--------------+ PERO     Full                                                        +---------+---------------+---------+-----------+----------+--------------+     Summary: RIGHT: - There is no evidence of deep vein thrombosis in the lower extremity.  - No cystic structure found in the popliteal fossa.  LEFT: - There is no evidence of deep vein thrombosis in the lower extremity.  - No cystic structure found in the  popliteal fossa.  *See table(s) above for measurements and observations. Electronically signed by Monica Martinez MD on 03/30/2022 at 4:47:59 PM.    Final    DG CHEST PORT 1 VIEW  Result Date: 03/30/2022 CLINICAL DATA:  Status post right lung biopsy EXAM: PORTABLE CHEST 1 VIEW COMPARISON:  03/29/2022 FINDINGS: Unchanged AP portable chest radiograph with subtle right greater than left upper lobe heterogeneous airspace opacities and probable small, layering bilateral pleural effusions. No new airspace opacity. No pneumothorax. Mild cardiomegaly. The visualized skeletal structures are unremarkable. IMPRESSION: Unchanged AP portable chest radiograph with subtle right greater than left upper lobe heterogeneous airspace opacities and probable small, layering bilateral pleural effusions. No new airspace opacity. No pneumothorax following biopsy. Electronically Signed   By: Delanna Ahmadi M.D.   On: 03/30/2022 13:24   DG ESOPHAGUS W SINGLE CM (SOL OR THIN BA)  Result Date: 03/30/2022 CLINICAL DATA:  Patient complaining of frequent globus sensation, regurgitation, and history of esophageal stricture. EXAM: ESOPHAGUS/BARIUM SWALLOW/TABLET  STUDY TECHNIQUE: Single contrast examination was performed using thin liquid barium. This exam was performed by Gareth Eagle, PA-C, and was supervised and interpreted by Dr. Fabiola Backer. FLUOROSCOPY: Radiation Exposure Index (as provided by the fluoroscopic device): 25.8 mGy Kerma COMPARISON:  CT chest dated March 27, 2022. FINDINGS: Study was limited secondary to patient's mobility issues and inability to stand. Swallowing: No aspiration seen. Esophagus: Smooth focal fixed narrowing of the distal esophagus just superior to the GE junction. No mucosal irregularity or defect. Esophageal motility: Mild dysmotility manifested by intermittent tertiary contractions. Hiatal Hernia: None. Gastroesophageal reflux: None visualized. Ingested 21mm barium tablet: Became stuck and did not pass beyond the gastroesophageal junction despite giving water and barium. Other: None. IMPRESSION: 1. Short-segment stricture of the distal esophagus just superior to the GE junction which did not allow passage of a barium tablet. This is likely reflux related, although no reflux was elicited on today's study. 2. Mild esophageal dysmotility. Electronically Signed   By: Titus Dubin M.D.   On: 03/30/2022 09:27   DG Chest Port 1 View  Result Date: 03/29/2022 CLINICAL DATA:  Shortness of breath and cough. EXAM: PORTABLE CHEST 1 VIEW COMPARISON:  03/26/2022 FINDINGS: Stable cardiomediastinal contours. Lungs are suboptimally inflated. No signs of pleural effusion or edema. Persistent asymmetric airspace opacification within the right upper lobe. IMPRESSION: Persistent asymmetric airspace opacification within the right upper lobe compatible with pneumonia. Electronically Signed   By: Kerby Moors M.D.   On: 03/29/2022 06:43   ECHOCARDIOGRAM COMPLETE  Result Date: 03/28/2022    ECHOCARDIOGRAM REPORT   Patient Name:   Geonna Starkman Date of Exam: 03/28/2022 Medical Rec #:  [id number]   Height:       64.0 in Accession #:    [id number]  Weight:        211.4 lb Date of Birth:  [birth year]   BSA:          2.003 m Patient Age:    13 years    BP:           121/84 mmHg Patient Gender: F           HR:           86 bpm. Exam Location:  [facility] Procedure: 2D Echo, Cardiac Doppler and Color Doppler Indications:    Pulmonary embolus  History:        Patient has no prior history of Echocardiogram examinations.  Risk Factors:Hypertension and Dyslipidemia.  Sonographer:    Wenda Low Referring Phys: [phone] Maleia Weems IMPRESSIONS  1. Left ventricular ejection fraction, by estimation, is 55%. The left ventricle has normal function. The left ventricle has no regional wall motion abnormalities. Left ventricular diastolic parameters were normal.  2. Right ventricular systolic function is normal. The right ventricular size is normal. There is normal pulmonary artery systolic pressure.  3. The pericardial effusion is anterior to the right ventricle.  4. The mitral valve is abnormal. Trivial mitral valve regurgitation. No evidence of mitral stenosis.  5. The aortic valve is tricuspid. There is mild calcification of the aortic valve. Aortic valve regurgitation is not visualized. Aortic valve sclerosis is present, with no evidence of aortic valve stenosis.  6. The inferior vena cava is normal in size with greater than 50% respiratory variability, suggesting right atrial pressure of 3 mmHg. FINDINGS  Left Ventricle: Left ventricular ejection fraction, by estimation, is 55%. The left ventricle has normal function. The left ventricle has no regional wall motion abnormalities. The left ventricular internal cavity size was normal in size. There is no left ventricular hypertrophy. Left ventricular diastolic parameters were normal. Right Ventricle: The right ventricular size is normal. No increase in right ventricular wall thickness. Right ventricular systolic function is normal. There is normal pulmonary artery systolic pressure. The tricuspid regurgitant velocity is  1.76 m/s, and  with an assumed right atrial pressure of 3 mmHg, the estimated right ventricular systolic pressure is 40.3 mmHg. Left Atrium: Left atrial size was normal in size. Right Atrium: Right atrial size was normal in size. Pericardium: Trivial pericardial effusion is present. The pericardial effusion is anterior to the right ventricle. Mitral Valve: The mitral valve is abnormal. There is mild thickening of the mitral valve leaflet(s). There is mild calcification of the mitral valve leaflet(s). Trivial mitral valve regurgitation. No evidence of mitral valve stenosis. MV peak gradient, 6.5 mmHg. The mean mitral valve gradient is 3.0 mmHg. Tricuspid Valve: The tricuspid valve is normal in structure. Tricuspid valve regurgitation is trivial. No evidence of tricuspid stenosis. Aortic Valve: The aortic valve is tricuspid. There is mild calcification of the aortic valve. Aortic valve regurgitation is not visualized. Aortic valve sclerosis is present, with no evidence of aortic valve stenosis. Aortic valve mean gradient measures 5.0 mmHg. Aortic valve peak gradient measures 10.8 mmHg. Aortic valve area, by VTI measures 2.22 cm. Pulmonic Valve: The pulmonic valve was normal in structure. Pulmonic valve regurgitation is not visualized. No evidence of pulmonic stenosis. Aorta: The aortic root is normal in size and structure. Venous: The inferior vena cava is normal in size with greater than 50% respiratory variability, suggesting right atrial pressure of 3 mmHg. IAS/Shunts: No atrial level shunt detected by color flow Doppler.  LEFT VENTRICLE PLAX 2D LVIDd:         4.90 cm     Diastology LVIDs:         3.50 cm     LV e' medial:    7.72 cm/s LV PW:         1.00 cm     LV E/e' medial:  15.2 LV IVS:        0.90 cm     LV e' lateral:   14.60 cm/s LVOT diam:     2.00 cm     LV E/e' lateral: 8.0 LV SV:         76 LV SV Index:   38 LVOT Area:  3.14 cm  LV Volumes (MOD) LV vol d, MOD A2C: 60.2 ml LV vol d, MOD A4C: 49.4 ml  LV vol s, MOD A2C: 24.4 ml LV vol s, MOD A4C: 23.8 ml LV SV MOD A2C:     35.8 ml LV SV MOD A4C:     49.4 ml LV SV MOD BP:      30.8 ml RIGHT VENTRICLE RV Basal diam:  3.15 cm RV Mid diam:    2.80 cm RV S prime:     13.40 cm/s TAPSE (M-mode): 2.3 cm LEFT ATRIUM             Index        RIGHT ATRIUM           Index LA diam:        3.70 cm 1.85 cm/m   RA Area:     15.50 cm LA Vol (A2C):   46.0 ml 22.96 ml/m  RA Volume:   37.70 ml  18.82 ml/m LA Vol (A4C):   50.9 ml 25.41 ml/m LA Biplane Vol: 50.3 ml 25.11 ml/m  AORTIC VALVE                     PULMONIC VALVE AV Area (Vmax):    2.38 cm      PV Vmax:       0.90 m/s AV Area (Vmean):   2.30 cm      PV Peak grad:  3.2 mmHg AV Area (VTI):     2.22 cm AV Vmax:           164.00 cm/s AV Vmean:          107.000 cm/s AV VTI:            0.344 m AV Peak Grad:      10.8 mmHg AV Mean Grad:      5.0 mmHg LVOT Vmax:         124.00 cm/s LVOT Vmean:        78.500 cm/s LVOT VTI:          0.243 m LVOT/AV VTI ratio: 0.71  AORTA Ao Root diam: 2.90 cm MITRAL VALVE                TRICUSPID VALVE MV Area (PHT): 3.68 cm     TR Peak grad:   12.4 mmHg MV Area VTI:   2.13 cm     TR Vmax:        176.00 cm/s MV Peak grad:  6.5 mmHg MV Mean grad:  3.0 mmHg     SHUNTS MV Vmax:       1.27 m/s     Systemic VTI:  0.24 m MV Vmean:      77.6 cm/s    Systemic Diam: 2.00 cm MV Decel Time: 206 msec MV E velocity: 117.00 cm/s MV A velocity: 89.50 cm/s MV E/A ratio:  1.31 Jenkins Rouge MD Electronically signed by Jenkins Rouge MD Signature Date/Time: 03/28/2022/12:20:15 PM    Final    CT Angio Chest Pulmonary Embolism (PE) W or WO Contrast  Result Date: 03/27/2022 CLINICAL DATA:  Chest pain and shortness of breath for several weeks, recent pneumonia EXAM: CT ANGIOGRAPHY CHEST WITH CONTRAST TECHNIQUE: Multidetector CT imaging of the chest was performed using the standard protocol during bolus administration of intravenous contrast. Multiplanar CT image reconstructions and MIPs were obtained to evaluate  the vascular anatomy. RADIATION DOSE REDUCTION: This exam was performed according to the departmental dose-optimization  program which includes automated exposure control, adjustment of the mA and/or kV according to patient size and/or use of iterative reconstruction technique. CONTRAST:  25mL OMNIPAQUE IOHEXOL 300 MG/ML  SOLN IV COMPARISON:  CT chest 01/04/2022 FINDINGS: Cardiovascular: Aorta normal caliber without aneurysm or dissection. Proximal great vessels unremarkable. Heart normal appearance. No pericardial effusion. Pulmonary arteries adequately opacified. Two small filling defects are identified consistent with pulmonary embolism, in RIGHT lower lobe image 214 and in RIGHT middle lobe image 160. No additional emboli. Mediastinum/Nodes: Esophagus unremarkable. Few normal sized mediastinal lymph nodes. No thoracic adenopathy. Base of cervical region normal appearance. Lungs/Pleura: Small BILATERAL pleural effusions. BILATERAL pulmonary infiltrates, greatest in RIGHT upper lobe. No pneumothorax or mass. Upper Abdomen: Gallbladder surgically absent. Splenic deformity question related to prior infarct or surgery. Musculoskeletal: No acute osseous findings. Review of the MIP images confirms the above findings. IMPRESSION: Small pulmonary emboli in RIGHT lung. Small BILATERAL pleural effusions. Patchy BILATERAL pulmonary infiltrates consistent with multifocal pneumonia greatest in RIGHT upper lobe. Critical Value/emergent results were called by telephone at the time of interpretation on 03/27/2022 at 1510 hrs to provider Kylan Liberati MD, who verbally acknowledged these results. Electronically Signed   By: Lavonia Dana M.D.   On: 03/27/2022 15:13   CT Head Wo Contrast  Result Date: 03/26/2022 CLINICAL DATA:  Altered mental status.  Recent stroke. EXAM: CT HEAD WITHOUT CONTRAST TECHNIQUE: Contiguous axial images were obtained from the base of the skull through the vertex without intravenous contrast. RADIATION DOSE  REDUCTION: This exam was performed according to the departmental dose-optimization program which includes automated exposure control, adjustment of the mA and/or kV according to patient size and/or use of iterative reconstruction technique. COMPARISON:  11/01/2020 FINDINGS: Brain: No evidence of intracranial hemorrhage, acute infarction, hydrocephalus, extra-axial collection, or mass lesion/mass effect. 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 Vascular:  No hyperdense vessel or other acute findings. Skull: No evidence of fracture or other significant bone abnormality. Sinuses/Orbits:  No acute findings. Other: None. IMPRESSION: No acute intracranial abnormality. Electronically Signed   By: Marlaine Hind M.D.   On: 03/26/2022 11:42   DG Chest Port 1 View  Result Date: 03/26/2022 CLINICAL DATA:  Sepsis. EXAM: PORTABLE CHEST 1 VIEW COMPARISON:  February 19, 2022 FINDINGS: There is infiltrate in the right mid lung. There may be infiltrate in the medial left upper lung. No pneumothorax. No other acute interval changes. The study is limited however due to portable technique. IMPRESSION: The study is limited due to low volume portable technique. However, there is an infiltrate in the right mid lung consistent with for pneumonia given history. There may be a more subtle smaller infiltrate in the medial left upper lung. Electronically Signed   By: Dorise Bullion III M.D.   On: 03/26/2022 10:22    Orson Eva, DO  [facility]  If 7PM-7AM, please contact night-coverage [email or web address] Password [email or web address] 04/19/2022, 6:05 PM   LOS: 2 days

## 2022-04-19 NOTE — Consult Note (Signed)
[facility] Renal Consultation Note  Requesting MD: Tat Indication for Consultation: AKI  HPI:  [name] is a 56 y.o. female with past medical history significant for granulomatosis with poly angiitis apparently diagnosed 15 years ago after upper resp issues-  on chronic methotrexate/rituximab and pred ( sees a Dr. Talbert Cage with rheumatology) recurrent PNA and PE followed by pulmonary-  seen on 04/12/22 as OP by Dr. Thora Lance, also HTN, adrenal insufficiency, opioid dependence and RA.  S/p hospitalization from 6/4/ to 6/14 for sepsis related to PNA and possible UTI- with new PE.  She presented to ER on 6/26 with complaints of MS change and weakness-  had fever, hypotension-  crt 1.73 where it had been 0.89 on 6/14-  other labs of note WBC 25, lactate of 4.7, ck of 26 K -  she was admitted to the ICU-  hydrated and given maxepime and zyvox.   UOP has decreased and crt up to 2.24 yesterday and 3.57 today so I am asked to see for AKI.  Renal ultrasound is unremarkable-  10-11 cm kidneys -  normal echogen and no hydro.  Urine greater than 50 WBC and 63875 RBC-  100 of protein -  urine culture pending -  blood cultures neg to date.  She is alert-  possibly a little confused but not bad-  no O2 req  Creat  Date/Time Value Ref Range Status  07/01/2021 09:09 AM 1.07 (H) 0.50 - 1.03 mg/dL Final  64/33/2951 88:41 PM 1.04 (H) 0.50 - 1.03 mg/dL Final  66/03/3015 01:09 AM 1.03 0.50 - 1.03 mg/dL Final  32/35/5732 20:25 PM 0.96 0.50 - 1.05 mg/dL Final    Comment:    For patients >8 years of age, the reference limit for Creatinine is approximately 13% higher for people identified as African-American. .    Creatinine, Ser  Date/Time Value Ref Range Status  04/19/2022 03:33 AM 3.57 (H) 0.44 - 1.00 mg/dL Final    Comment:    DELTA CHECK NOTED  04/18/2022 03:26 AM 2.24 (H) 0.44 - 1.00 mg/dL Final  42/70/6237 62:83 AM 1.73 (H) 0.44 - 1.00 mg/dL Final  15/17/6160 73:71 AM 0.89 0.44 - 1.00 mg/dL  Final  04/17/9484 46:27 AM 0.82 0.44 - 1.00 mg/dL Final  03/50/0938 18:29 AM 0.89 0.44 - 1.00 mg/dL Final  93/71/6967 89:38 AM 0.81 0.44 - 1.00 mg/dL Final  08/08/5101 58:52 AM 0.76 0.44 - 1.00 mg/dL Final  77/82/4235 36:14 AM 0.93 0.44 - 1.00 mg/dL Final  43/15/4008 67:61 AM 0.81 0.44 - 1.00 mg/dL Final  95/06/3266 12:45 AM 0.69 0.44 - 1.00 mg/dL Final  80/99/8338 25:05 AM 0.86 0.44 - 1.00 mg/dL Final  39/76/7341 93:79 AM 1.11 (H) 0.44 - 1.00 mg/dL Final  02/40/9735 32:99 AM 0.89 0.44 - 1.00 mg/dL Final  24/26/8341 96:22 PM 1.39 (H) 0.44 - 1.00 mg/dL Final  29/79/8921 19:41 AM 0.93 0.44 - 1.00 mg/dL Final  74/05/1447 18:56 AM 1.13 (H) 0.44 - 1.00 mg/dL Final  31/49/7026 37:85 AM 1.04 (H) 0.44 - 1.00 mg/dL Final  88/50/2774 12:87 AM 1.02 (H) 0.44 - 1.00 mg/dL Final  86/76/7209 47:09 PM 1.10 (H) 0.44 - 1.00 mg/dL Final  62/83/6629 47:65 PM 1.05 (H) 0.44 - 1.00 mg/dL Final  46/50/3546 56:81 PM 1.20 (H) 0.44 - 1.00 mg/dL Final  27/51/7001 74:94 PM 1.25 (H) 0.44 - 1.00 mg/dL Final  49/67/5916 38:46 AM 1.01 (H) 0.44 - 1.00 mg/dL Final  65/99/3570 17:79 AM 0.85 0.44 - 1.00 mg/dL Final  39/12/90 33:00  AM 0.92 0.44 - 1.00 mg/dL Final  24/06/7352 29:92 AM 0.86 0.44 - 1.00 mg/dL Final  42/68/3419 62:22 PM 0.97 0.44 - 1.00 mg/dL Final  97/98/9211 94:17 PM 1.05 (H) 0.44 - 1.00 mg/dL Final  40/81/4481 85:63 AM 0.73 0.44 - 1.00 mg/dL Final  14/97/0263 78:58 AM 0.85 0.44 - 1.00 mg/dL Final  85/11/7739 28:78 AM 1.28 (H) 0.44 - 1.00 mg/dL Final  67/67/2094 70:96 AM 1.21 (H) 0.44 - 1.00 mg/dL Final  28/36/6294 76:54 PM 0.98 0.44 - 1.00 mg/dL Final  65/12/5463 68:12 PM 2.09 (H) 0.44 - 1.00 mg/dL Final  75/17/0017 49:44 AM 0.79 0.44 - 1.00 mg/dL Final  96/75/9163 84:66 AM 0.86 0.44 - 1.00 mg/dL Final  59/93/5701 77:93 AM 0.88 0.44 - 1.00 mg/dL Final  90/30/0923 30:07 AM 1.05 (H) 0.44 - 1.00 mg/dL Final  62/26/3335 45:62 AM 0.81 0.44 - 1.00 mg/dL Final  56/38/9373 42:87 AM 1.03 (H) 0.44 - 1.00  mg/dL Final  68/08/5725 20:35 AM 1.09 (H) 0.44 - 1.00 mg/dL Final  59/74/1638 45:36 PM 1.39 (H) 0.44 - 1.00 mg/dL Final  46/80/3212 24:82 PM 0.90 0.44 - 1.00 mg/dL Final  50/12/7046 88:91 AM 1.01 0.50 - 1.10 mg/dL Final  69/45/0388 82:80 AM 1.14 (H) 0.50 - 1.10 mg/dL Final     PMHx:   Past Medical History:  Diagnosis Date   Chronic cough    Chronic pain    Diabetes mellitus without complication (HCC)    Dyspnea    GERD (gastroesophageal reflux disease)    Hypokalemia    Hypothyroidism    Left wrist pain 06/01/2021   Myonecrosis (HCC) 07/01/2021   Neurocardiogenic syncope    OSA (obstructive sleep apnea)    does not use CPAP   Osteomyelitis (HCC)    bilateral feet   Peripheral neuropathy    Presence of intrathecal pump    recieves Prialt/bupivicaine   Tear of gluteus medius tendon 07/01/2021   Wegener's granulomatosis 2009   Wegner's disease (congenital syphilitic osteochondritis)     Past Surgical History:  Procedure Laterality Date   AMPUTATION Right 10/21/2020   Procedure: Right hallux amputation;  Surgeon: Toni Arthurs, MD;  Location: [facility];  Service: Orthopedics;  Laterality: Right;   AMPUTATION TOE Bilateral 08/12/2020   Procedure: Right 3rd toe amputation; left hallux and 3rd toe amputation;  Surgeon: Toni Arthurs, MD;  Location: [facility];  Service: Orthopedics;  Laterality: Bilateral;    AMPUTATION TOE Right 03/24/2021   Procedure: AMPUTATION TOE;  Surgeon: Toni Arthurs, MD;  Location: [facility];  Service: Orthopedics;  Laterality: Right;   APPENDECTOMY     BACK SURGERY     BIOPSY  03/30/2022   Procedure: BIOPSY;  Surgeon: Tomma Lightning, MD;  Location: [facility];  Service: Pulmonary;;   BRONCHIAL WASHINGS  03/30/2022   Procedure: BRONCHIAL WASHINGS;  Surgeon: Tomma Lightning, MD;  Location: [facility];  Service: Pulmonary;;   CHOLECYSTECTOMY     COLONOSCOPY N/A 06/02/2013   Procedure: COLONOSCOPY;  Surgeon: Graylin Shiver, MD;  Location: [facility];  Service: Endoscopy;  Laterality: N/A;   HERNIA REPAIR  2000   INCISION AND DRAINAGE ABSCESS Left 07/07/2016   Procedure: DEBRIDMENT LEFT THIGH ABSCESS, EXISION ACUTE SKIN RASH LEFT THIGH(1CM LESION);  Surgeon: Claud Kelp, MD;  Location: [facility];  Service: General;  Laterality: Left;   SEPTOPLASTY  02/2013   spleen anuyism     splenic aneurysm     TRANSMETATARSAL AMPUTATION Bilateral 10/21/2020  Procedure: Left transmetatarsal amputation;  Surgeon: Toni Arthurs, MD;  Location: [facility];  Service: Orthopedics;  Laterality: Bilateral;   TRANSMETATARSAL AMPUTATION Left 03/24/2021   Procedure: TRANSMETATARSAL AMPUTATION;  Surgeon: Toni Arthurs, MD;  [facility];  Service: Orthopedics;  Laterality: Left;   VAGINAL HYSTERECTOMY     VIDEO BRONCHOSCOPY Right 03/30/2022   Procedure: VIDEO BRONCHOSCOPY WITH FLUORO;  Surgeon: Tomma Lightning, MD;  Location: [facility];  Service: Pulmonary;  Laterality: Right;  atypical pneumonia    Family Hx:  Family History  Problem Relation Age of Onset   Diabetes Mother    Heart disease Mother    Diabetes Father     Social History:  reports that she has never smoked. She has never used smokeless tobacco. She reports current alcohol use. She reports that she does not use drugs.  Allergies:  Allergies  Allergen Reactions   Ibuprofen Other (See Comments)    Pt is unable to take this due to kidney problems.      Penicillins Rash and Other (See Comments)    Has patient had a PCN reaction causing immediate rash, facial/tongue/throat swelling, SOB or lightheadedness with hypotension: No Has patient had a PCN reaction causing severe rash involving mucus membranes or skin necrosis: No Has patient had a PCN reaction that required hospitalization No Has patient had a PCN reaction occurring within the last 10 years: No If all of the above answers are "NO", then may proceed with Cephalosporin use.     Medications: Prior to Admission medications   Medication Sig Start Date End Date Taking? Authorizing Provider  acetaminophen (TYLENOL) 325 MG tablet Take 2 tablets (650 mg total) by mouth every 6 (six) hours as needed for mild pain (or Fever >/= 101). 07/12/16  Yes Vann, Jessica U, DO  albuterol (VENTOLIN HFA) 108 (90 Base) MCG/ACT inhaler Inhale 2 puffs into the lungs every 6 (six) hours as needed for wheezing or shortness of breath. 04/12/22  Yes Omar Person, MD  amitriptyline (ELAVIL) 50 MG tablet Take 50 mg by mouth at bedtime. 11/04/21  Yes [provider]  apixaban (ELIQUIS) 5 MG TABS tablet 10mg  BID till 04/06/22 then 5 mg BID from 04/07/22 Patient taking differently: Take 5 mg by mouth 2 (two) times daily. 04/05/22  Yes 04/07/22, MD  Calcium Carb-Cholecalciferol (CALCIUM CARBONATE-VITAMIN D3 PO) Take 1 tablet by mouth daily.   Yes [provider]  DULoxetine (CYMBALTA) 60 MG capsule Take 60 mg by mouth 2 (two) times daily.   Yes [provider]  folic acid (FOLVITE) 1 MG tablet Take 2 mg by mouth daily.   Yes [provider]  gabapentin (NEURONTIN) 300 MG capsule Take 2 capsules (600 mg total) by mouth 3 (three) times daily. Patient taking differently: Take 600 mg by mouth 2 (two) times daily. 04/05/22  Yes 04/07/22, MD  guaiFENesin (MUCINEX) 600 MG 12 hr tablet Take 1 tablet (600 mg total) by mouth 2 (two) times daily. 04/12/22  Yes 04/14/22, MD  lidocaine (LIDODERM) 5 % Place 1 patch onto the skin daily as needed (for pain). Remove & Discard patch within 12 hours or as directed by MD   Yes [provider]  linaclotide (LINZESS) 145 MCG CAPS capsule Take 145 mcg by mouth daily before breakfast. 09/21/21  Yes [provider]  magnesium oxide (MAG-OX) 400 MG tablet Take 400 mg by mouth daily.   Yes [provider]  Melatonin 10 MG TABS Take  10 mg by mouth at bedtime as needed (sleep).   Yes [provider]  metFORMIN (GLUCOPHAGE-XR) 500 MG 24 hr tablet Take 500 mg by mouth daily with breakfast. 03/13/22  Yes [provider]  methocarbamol (ROBAXIN) 500 MG tablet Take 500 mg by mouth every 6 (six) hours as needed for muscle spasms. 02/02/22  Yes [provider]  pantoprazole (PROTONIX) 40 MG tablet Take 40 mg by mouth daily. 07/01/21  Yes [provider]  Potassium Chloride Crys ER (K-DUR PO) Take 40 mEq by mouth every other day.   Yes [provider]  predniSONE (DELTASONE) 10 MG tablet Take 1 tab ( ) daily for 2 days, then reduce to your home dose of  daily Patient taking differently: Take 5 mg by mouth daily. 04/05/22  Yes Glade Lloyd, MD  rosuvastatin (CRESTOR) 10 MG tablet Take 10 mg by mouth at bedtime. 10/25/21  Yes [provider]  senna (SENOKOT) 8.6 MG TABS tablet Take 2 tablets (17.2 mg total) by mouth 2 (two) times daily. 03/25/21  Yes Jacinta Shoe, PA-C  sulfamethoxazole-trimethoprim (BACTRIM) 400-80 MG tablet Take 1 tablet by mouth 3 (three) times a week. Monday,Wednesday and Friday 07/13/21  Yes [provider]  tapentadol HCl (NUCYNTA) 75 MG tablet Take 75 mg by mouth in the morning and at bedtime.   Yes [provider]  topiramate (TOPAMAX) 100 MG tablet Take 100 mg by mouth daily. 01/14/18  Yes [provider]  vitamin B-12 (CYANOCOBALAMIN) 1000 MCG tablet Take 1,000 mcg by mouth daily.   Yes [provider]  Vitamin D, Ergocalciferol, (DRISDOL) 50000 units CAPS capsule Take 50,000 Units by mouth every Friday.   Yes [provider]  METHOTREXATE SODIUM IJ Inject 25 mg into the skin every Sunday.    [provider]  naloxone [facility]) nasal spray 4 mg/0.1 mL Place 0.4 mg into the nose once. 12/11/19   [provider]    I have reviewed the patient's current medications.  Labs:  Results for orders placed or performed during the hospital encounter of 04/17/22  (from the past 48 hour(s))  Urinalysis, Routine w reflex microscopic Urine, In & Out Cath     Status: Abnormal   Collection Time: 04/17/22 11:15 AM  Result Value Ref Range   Color, Urine AMBER (A) YELLOW    Comment: BIOCHEMICALS MAY BE AFFECTED BY COLOR   APPearance CLOUDY (A) CLEAR   Specific Gravity, Urine 1.019 1.005 - 1.030   pH 5.0 5.0 - 8.0   Glucose, UA 50 (A) NEGATIVE mg/dL   Hgb urine dipstick LARGE (A) NEGATIVE   Bilirubin Urine NEGATIVE NEGATIVE   Ketones, ur NEGATIVE NEGATIVE mg/dL   Protein, ur 960 (A) NEGATIVE mg/dL   Nitrite NEGATIVE NEGATIVE   Leukocytes,Ua LARGE (A) NEGATIVE   RBC / HPF 11-20 0 - 5 RBC/hpf   WBC, UA >50 (H) 0 - 5 WBC/hpf   Bacteria, UA FEW (A) NONE SEEN   WBC Clumps PRESENT    Mucus PRESENT    Budding Yeast PRESENT     Comment: Performed at [facility], [address], [place], [place] [address]  Comprehensive metabolic panel     Status: Abnormal   Collection Time: 04/17/22 11:24 AM  Result Value Ref Range   Sodium 136 135 - 145 mmol/L   Potassium 4.7 3.5 - 5.1 mmol/L   Chloride 106 98 - 111 mmol/L   CO2 17 (L) 22 - 32 mmol/L   Glucose, Bld 253 (  H) 70 - 99 mg/dL    Comment: Glucose reference range applies only to samples taken after fasting for at least 8 hours.   BUN 14 6 - 20 mg/dL   Creatinine, Ser 4.09 (H) 0.44 - 1.00 mg/dL   Calcium 8.7 (L) 8.9 - 10.3 mg/dL   Total Protein 5.9 (L) 6.5 - 8.1 g/dL   Albumin 2.9 (L) 3.5 - 5.0 g/dL   AST 811 (H) 15 - 41 U/L   ALT 54 (H) 0 - 44 U/L   Alkaline Phosphatase 103 38 - 126 U/L   Total Bilirubin 0.7 0.3 - 1.2 mg/dL   GFR, Estimated 34 (L) >60 mL/min    Comment: (NOTE) Calculated using the CKD-EPI Creatinine Equation (2021)    Anion gap 13 5 - 15    Comment: Performed at [facility], [address], [place], [place] [address]  CBC with Differential     Status: Abnormal   Collection Time: 04/17/22 11:24 AM  Result Value Ref Range   WBC 24.5 (H) 4.0 - 10.5 K/uL   RBC 4.21 3.87 - 5.11 MIL/uL    Hemoglobin 11.2 (L) 12.0 - 15.0 g/dL   HCT 29.5 62.1 - 30.8 %   MCV 90.5 80.0 - 100.0 fL   MCH 26.6 26.0 - 34.0 pg   MCHC 29.4 (L) 30.0 - 36.0 g/dL   RDW 65.7 (H) 84.6 - 96.2 %   Platelets 286 150 - 400 K/uL   nRBC 0.3 (H) 0.0 - 0.2 %   Neutrophils Relative % 75 %   Neutro Abs 18.4 (H) 1.7 - 7.7 K/uL   Lymphocytes Relative 18 %   Lymphs Abs 4.3 (H) 0.7 - 4.0 K/uL   Monocytes Relative 4 %   Monocytes Absolute 1.0 0.1 - 1.0 K/uL   Eosinophils Relative 0 %   Eosinophils Absolute 0.1 0.0 - 0.5 K/uL   Basophils Relative 1 %   Basophils Absolute 0.2 (H) 0.0 - 0.1 K/uL   WBC Morphology INCREASED BANDS (>20% BANDS)    RBC Morphology See Note     Comment: ANISOCYTOSIS   Smear Review MORPHOLOGY UNREMARKABLE    Immature Granulocytes 2 %   Abs Immature Granulocytes 0.50 (H) 0.00 - 0.07 K/uL    Comment: Performed at [facility], [address] [place], [place] [address]  Protime-INR     Status: Abnormal   Collection Time: 04/17/22 11:24 AM  Result Value Ref Range   Prothrombin Time 21.0 (H) 11.4 - 15.2 seconds   INR 1.8 (H) 0.8 - 1.2    Comment: (NOTE) INR goal varies based on device and disease states. Performed at [facility], [address], [place], [place] [address]   Lactic acid, plasma     Status: Abnormal   Collection Time: 04/17/22 11:25 AM  Result Value Ref Range   Lactic Acid, Venous 4.7 (HH) 0.5 - 1.9 mmol/L    Comment: CRITICAL RESULT CALLED TO, READ BACK BY AND VERIFIED WITH: CRAWFORD,H AT 12:05PM ON 04/17/22 BY [facility] Performed at [facility], [address], [place], [place] [address]   Culture, blood (Routine x 2)     Status: None (Preliminary result)   Collection Time: 04/17/22 11:25 AM   Specimen: Left Antecubital; Blood  Result Value Ref Range   Specimen Description LEFT ANTECUBITAL    Special Requests      BOTTLES DRAWN AEROBIC AND ANAEROBIC Blood Culture results may not be optimal due to an inadequate volume of blood received in culture bottles  Culture      NO GROWTH 2 DAYS Performed at [facility], [address] [place], [place] [address]    Report Status PENDING   APTT     Status: None   Collection Time: 04/17/22 11:25 AM  Result Value Ref Range   aPTT 34 24 - 36 seconds    Comment: Performed at [facility], [address], [place], [place] [address]  Culture, blood (Routine x 2)     Status: None (Preliminary result)   Collection Time: 04/17/22 11:34 AM   Specimen: BLOOD LEFT ARM  Result Value Ref Range   Specimen Description BLOOD LEFT ARM    Special Requests      BOTTLES DRAWN AEROBIC AND ANAEROBIC Blood Culture adequate volume   Culture      NO GROWTH 2 DAYS Performed at [facility], [address] [place], [place] [address]    Report Status PENDING   Urine Culture     Status: None (Preliminary result)   Collection Time: 04/17/22 11:53 AM   Specimen: Urine, Catheterized  Result Value Ref Range   Specimen Description      URINE, CATHETERIZED Performed at [facility], [address] [place], [place] [address]    Special Requests      NONE Performed at [facility], [address], [place], [place] [address]    Culture      CULTURE REINCUBATED FOR BETTER GROWTH Performed at [facility], [address] [place], [place] [address]    Report Status PENDING   Resp Panel by RT-PCR (Flu A&B, Covid) Anterior Nasal Swab     Status: Abnormal   Collection Time: 04/17/22 11:57 AM   Specimen: Anterior Nasal Swab  Result Value Ref Range   SARS Coronavirus 2 by RT PCR POSITIVE (A) NEGATIVE    Comment: (NOTE) SARS-CoV-2 target nucleic acids are DETECTED.  The SARS-CoV-2 RNA is generally detectable in upper respiratory specimens during the acute phase of infection. Positive results are indicative of the presence of the identified virus, but do not rule out bacterial infection or co-infection with other pathogens not detected by the test. Clinical correlation with patient history and other diagnostic  information is necessary to determine patient infection status. The expected result is Negative.  Fact Sheet for Patients: BloggerCourse.com  Fact Sheet for Healthcare Providers: SeriousBroker.it  This test is not yet approved or cleared by the [place] FDA and  has been authorized for detection and/or diagnosis of SARS-CoV-2 by FDA under an Emergency Use Authorization (EUA).  This EUA will remain in effect (meaning this test can be used) for the duration of  the COVID-19 declaration under Section 564(b)(1) of the A ct, 21 U.S.C. section 360bbb-3(b)(1), unless the authorization is terminated or revoked sooner.     Influenza A by PCR NEGATIVE NEGATIVE   Influenza B by PCR NEGATIVE NEGATIVE    Comment: (NOTE) The Xpert Xpress SARS-CoV-2/FLU/RSV plus assay is intended as an aid in the diagnosis of influenza from Nasopharyngeal swab specimens and should not be used as a sole basis for treatment. Nasal washings and aspirates are unacceptable for Xpert Xpress SARS-CoV-2/FLU/RSV testing.  Fact Sheet for Patients: BloggerCourse.com  Fact Sheet for Healthcare Providers: SeriousBroker.it  This test is not yet approved or cleared by the [place] FDA and has been authorized for detection and/or diagnosis of SARS-CoV-2 by FDA under an Emergency Use Authorization (EUA). This EUA will remain in effect (meaning this test can be used) for the duration  of the COVID-19 declaration under Section 564(b)(1) of the Act, 21 U.S.C. section 360bbb-3(b)(1), unless the authorization is terminated or revoked.  Performed at [facility], [address], [place], [place] [address]   Lactic acid, plasma     Status: Abnormal   Collection Time: 04/17/22 12:48 PM  Result Value Ref Range   Lactic Acid, Venous 4.0 (HH) 0.5 - 1.9 mmol/L    Comment: CRITICAL VALUE NOTED.  VALUE IS CONSISTENT WITH  PREVIOUSLY REPORTED AND CALLED VALUE. Performed at [facility], [address] [place], [place] [address]   Lactic acid, plasma     Status: Abnormal   Collection Time: 04/17/22  5:56 PM  Result Value Ref Range   Lactic Acid, Venous 3.9 (HH) 0.5 - 1.9 mmol/L    Comment: CRITICAL VALUE NOTED.  VALUE IS CONSISTENT WITH PREVIOUSLY REPORTED AND CALLED VALUE. Performed at [facility], [address], [place], [place] [address]   Lactic acid, plasma     Status: Abnormal   Collection Time: 04/17/22  8:28 PM  Result Value Ref Range   Lactic Acid, Venous 3.9 (HH) 0.5 - 1.9 mmol/L    Comment: CRITICAL VALUE NOTED.  VALUE IS CONSISTENT WITH PREVIOUSLY REPORTED AND CALLED VALUE. Performed at [facility], [address], [place], [place] [address]   Glucose, capillary     Status: Abnormal   Collection Time: 04/17/22  9:19 PM  Result Value Ref Range   Glucose-Capillary 310 (H) 70 - 99 mg/dL    Comment: Glucose reference range applies only to samples taken after fasting for at least 8 hours.  Comprehensive metabolic panel     Status: Abnormal   Collection Time: 04/18/22  3:26 AM  Result Value Ref Range   Sodium 134 (L) 135 - 145 mmol/L   Potassium 4.8 3.5 - 5.1 mmol/L   Chloride 106 98 - 111 mmol/L   CO2 18 (L) 22 - 32 mmol/L   Glucose, Bld 252 (H) 70 - 99 mg/dL    Comment: Glucose reference range applies only to samples taken after fasting for at least 8 hours.   BUN 27 (H) 6 - 20 mg/dL   Creatinine, Ser 2.95 (H) 0.44 - 1.00 mg/dL   Calcium 7.8 (L) 8.9 - 10.3 mg/dL   Total Protein 4.7 (L) 6.5 - 8.1 g/dL   Albumin 2.3 (L) 3.5 - 5.0 g/dL   AST 621 (H) 15 - 41 U/L   ALT 202 (H) 0 - 44 U/L   Alkaline Phosphatase 76 38 - 126 U/L   Total Bilirubin 0.5 0.3 - 1.2 mg/dL   GFR, Estimated 25 (L) >60 mL/min    Comment: (NOTE) Calculated using the CKD-EPI Creatinine Equation (2021)    Anion gap 10 5 - 15    Comment: Performed at [facility], [address], [place], [place] [address]  CBC      Status: Abnormal   Collection Time: 04/18/22  3:26 AM  Result Value Ref Range   WBC 19.1 (H) 4.0 - 10.5 K/uL   RBC 3.86 (L) 3.87 - 5.11 MIL/uL   Hemoglobin 10.1 (L) 12.0 - 15.0 g/dL   HCT 78.4 (L) 69.6 - 29.5 %   MCV 88.9 80.0 - 100.0 fL   MCH 26.2 26.0 - 34.0 pg   MCHC 29.4 (L) 30.0 - 36.0 g/dL   RDW 28.4 (H) 13.2 - 44.0 %   Platelets 271 150 - 400 K/uL   nRBC 0.0 0.0 - 0.2 %    Comment: Performed at  [facility], [address], [place], [place] [address]  Sedimentation rate     Status: Abnormal   Collection Time: 04/18/22  3:26 AM  Result Value Ref Range   Sed Rate 28 (H) 0 - 22 mm/hr    Comment: Performed at [facility], [address] [place], [place] [address]  C-reactive protein     Status: Abnormal   Collection Time: 04/18/22  3:26 AM  Result Value Ref Range   CRP 16.2 (H) <1.0 mg/dL    Comment: Performed at [facility], [address] [place], [place] [address]  Procalcitonin - Baseline     Status: None   Collection Time: 04/18/22  3:26 AM  Result Value Ref Range   Procalcitonin 4.45 ng/mL    Comment:        Interpretation: PCT > 2 ng/mL: Systemic infection (sepsis) is likely, unless other causes are known. (NOTE)       Sepsis PCT Algorithm           Lower Respiratory Tract                                      Infection PCT Algorithm    ----------------------------     ----------------------------         PCT < 0.25 ng/mL                PCT < 0.10 ng/mL          Strongly encourage             Strongly discourage   discontinuation of antibiotics    initiation of antibiotics    ----------------------------     -----------------------------       PCT 0.25 - 0.50 ng/mL            PCT 0.10 - 0.25 ng/mL               OR       >80% decrease in PCT            Discourage initiation of                                            antibiotics      Encourage discontinuation           of antibiotics    ----------------------------     -----------------------------          PCT >= 0.50 ng/mL              PCT 0.26 - 0.50 ng/mL               AND       <80% decrease in PCT              Encourage initiation of                                             antibiotics       Encourage continuation           of antibiotics    ----------------------------     -----------------------------        PCT >= 0.50 ng/mL  PCT > 0.50 ng/mL               AND         increase in PCT                  Strongly encourage                                      initiation of antibiotics    Strongly encourage escalation           of antibiotics                                     -----------------------------                                           PCT <= 0.25 ng/mL                                                 OR                                        > 80% decrease in PCT                                      Discontinue / Do not initiate                                             antibiotics  Performed at [facility], [address] [place], [place] [address]   Glucose, capillary     Status: Abnormal   Collection Time: 04/18/22  9:08 AM  Result Value Ref Range   Glucose-Capillary 158 (H) 70 - 99 mg/dL    Comment: Glucose reference range applies only to samples taken after fasting for at least 8 hours.  Glucose, capillary     Status: Abnormal   Collection Time: 04/18/22 11:17 AM  Result Value Ref Range   Glucose-Capillary 153 (H) 70 - 99 mg/dL    Comment: Glucose reference range applies only to samples taken after fasting for at least 8 hours.  CK     Status: Abnormal   Collection Time: 04/18/22  1:00 PM  Result Value Ref Range   Total CK 26,433 (H) 38 - 234 U/L    Comment: RESULTS CONFIRMED BY MANUAL DILUTION Performed at [facility], [address], [place], [place] [address]   Glucose, capillary     Status: Abnormal   Collection Time: 04/18/22  5:01 PM  Result Value Ref Range   Glucose-Capillary 142 (H) 70 - 99 mg/dL    Comment: Glucose  reference range applies only to samples taken after fasting for at least 8 hours.  Glucose, capillary     Status: Abnormal   Collection Time: 04/18/22  9:40 PM  Result Value Ref Range   Glucose-Capillary 251 (H) 70 - 99 mg/dL    Comment: Glucose reference range applies only to samples taken after fasting for at least 8 hours.  Procalcitonin     Status: None   Collection Time: 04/19/22  3:33 AM  Result Value Ref Range   Procalcitonin 4.17 ng/mL    Comment:        Interpretation: PCT > 2 ng/mL: Systemic infection (sepsis) is likely, unless other causes are known. (NOTE)       Sepsis PCT Algorithm           Lower Respiratory Tract                                      Infection PCT Algorithm    ----------------------------     ----------------------------         PCT < 0.25 ng/mL                PCT < 0.10 ng/mL          Strongly encourage             Strongly discourage   discontinuation of antibiotics    initiation of antibiotics    ----------------------------     -----------------------------       PCT 0.25 - 0.50 ng/mL            PCT 0.10 - 0.25 ng/mL               OR       >80% decrease in PCT            Discourage initiation of                                            antibiotics      Encourage discontinuation           of antibiotics    ----------------------------     -----------------------------         PCT >= 0.50 ng/mL              PCT 0.26 - 0.50 ng/mL               AND       <80% decrease in PCT              Encourage initiation of                                             antibiotics       Encourage continuation           of antibiotics    ----------------------------     -----------------------------        PCT >= 0.50 ng/mL                  PCT > 0.50 ng/mL               AND         increase in PCT                  Strongly encourage  initiation of antibiotics    Strongly encourage escalation           of antibiotics                                      -----------------------------                                           PCT <= 0.25 ng/mL                                                 OR                                        > 80% decrease in PCT                                      Discontinue / Do not initiate                                             antibiotics  Performed at [facility], [address] [place], [place] [address]   Creatinine, serum     Status: Abnormal   Collection Time: 04/19/22  3:33 AM  Result Value Ref Range   Creatinine, Ser 3.57 (H) 0.44 - 1.00 mg/dL    Comment: DELTA CHECK NOTED   GFR, Estimated 14 (L) >60 mL/min    Comment: (NOTE) Calculated using the CKD-EPI Creatinine Equation (2021) Performed at [facility], [address], [place], [place] [address]   Glucose, capillary     Status: Abnormal   Collection Time: 04/19/22  8:26 AM  Result Value Ref Range   Glucose-Capillary 139 (H) 70 - 99 mg/dL    Comment: Glucose reference range applies only to samples taken after fasting for at least 8 hours.     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  She now presents with a leukocytosis and probable UTI-  but also complicated by what appears to be rhabdo and AKI-  I suspect she is not that mobile at home and has led to this picture.  Treatment of UTI is underway-  given elevated CK and low bicarb and high K will hydrate aggressively for the next 24 hours with bicarb based fluids to assist with rhabdo-  follow CK and renal function closely -  no current indications for RRT.  DO NOT think this is vasculitis involvement of kidneys 2. Hypertension/volume  - initially low BP-  better now-  on prednisone-  on room air so will hydrate aggressively to treat rhabdo but need to watch for signs of volume overload  3. ID-  presumed UTI-  on broad spectrum abx given IS state-  cultures pending  4. Anemia  - present but stable    Cecille AverKellie A Shaelynn Dragos 04/19/2022, 9:54 AM

## 2022-04-19 NOTE — Telephone Encounter (Signed)
The referral was placed when 04/07/22

## 2022-04-19 NOTE — Telephone Encounter (Signed)
Returned call pt is still in the hospital, asking for a referral to rheumatology.

## 2022-04-20 ENCOUNTER — Inpatient Hospital Stay (HOSPITAL_COMMUNITY)
Admit: 2022-04-20 | Discharge: 2022-04-20 | Disposition: A | Discharge status: Home / Self Care | Payer: BC Managed Care – PPO | Attending: Internal Medicine | Admitting: Internal Medicine

## 2022-04-20 DIAGNOSIS — G9341 Metabolic encephalopathy: Secondary | ICD-10-CM | POA: Diagnosis not present

## 2022-04-20 DIAGNOSIS — N179 Acute kidney failure, unspecified: Secondary | ICD-10-CM | POA: Diagnosis not present

## 2022-04-20 DIAGNOSIS — A419 Sepsis, unspecified organism: Secondary | ICD-10-CM | POA: Diagnosis not present

## 2022-04-20 DIAGNOSIS — N3000 Acute cystitis without hematuria: Secondary | ICD-10-CM | POA: Diagnosis not present

## 2022-04-20 LAB — CULTURE, FUNGUS WITHOUT SMEAR

## 2022-04-20 LAB — GLUCOSE, CAPILLARY
Glucose-Capillary: 128 mg/dL — ABNORMAL HIGH (ref 70–99)
Glucose-Capillary: 188 mg/dL — ABNORMAL HIGH (ref 70–99)
Glucose-Capillary: 124 mg/dL — ABNORMAL HIGH (ref 70–99)
Glucose-Capillary: 155 mg/dL — ABNORMAL HIGH (ref 70–99)

## 2022-04-20 LAB — CBC WITH DIFFERENTIAL/PLATELET
Abs Immature Granulocytes: 0.28 10*3/uL — ABNORMAL HIGH (ref 0.00–0.07)
Basophils Absolute: 0 10*3/uL (ref 0.0–0.1)
Basophils Relative: 0 %
Eosinophils Absolute: 0 10*3/uL (ref 0.0–0.5)
Eosinophils Relative: 0 %
HCT: 25.6 % — ABNORMAL LOW (ref 36.0–46.0)
Hemoglobin: 8.2 g/dL — ABNORMAL LOW (ref 12.0–15.0)
Immature Granulocytes: 2 %
Lymphocytes Relative: 10 %
Lymphs Abs: 1.2 10*3/uL (ref 0.7–4.0)
MCH: 26.5 pg (ref 26.0–34.0)
MCHC: 32 g/dL (ref 30.0–36.0)
MCV: 82.8 fL (ref 80.0–100.0)
Monocytes Absolute: 0.5 10*3/uL (ref 0.1–1.0)
Monocytes Relative: 4 %
Neutro Abs: 10.5 10*3/uL — ABNORMAL HIGH (ref 1.7–7.7)
Neutrophils Relative %: 84 %
Platelets: 245 10*3/uL (ref 150–400)
RBC: 3.09 MIL/uL — ABNORMAL LOW (ref 3.87–5.11)
RDW: 21.1 % — ABNORMAL HIGH (ref 11.5–15.5)
WBC: 12.4 10*3/uL — ABNORMAL HIGH (ref 4.0–10.5)
nRBC: 0 % (ref 0.0–0.2)

## 2022-04-20 LAB — COMPREHENSIVE METABOLIC PANEL
ALT: 203 U/L — ABNORMAL HIGH (ref 0–44)
AST: 438 U/L — ABNORMAL HIGH (ref 15–41)
Albumin: 1.9 g/dL — ABNORMAL LOW (ref 3.5–5.0)
Alkaline Phosphatase: 80 U/L (ref 38–126)
Anion gap: 10 (ref 5–15)
BUN: 51 mg/dL — ABNORMAL HIGH (ref 6–20)
CO2: 23 mmol/L (ref 22–32)
Calcium: 6.6 mg/dL — ABNORMAL LOW (ref 8.9–10.3)
Chloride: 94 mmol/L — ABNORMAL LOW (ref 98–111)
Creatinine, Ser: 4.37 mg/dL — ABNORMAL HIGH (ref 0.44–1.00)
GFR, Estimated: 11 mL/min — ABNORMAL LOW (ref >60)
Glucose, Bld: 156 mg/dL — ABNORMAL HIGH (ref 70–99)
Potassium: 4.9 mmol/L (ref 3.5–5.1)
Sodium: 127 mmol/L — ABNORMAL LOW (ref 135–145)
Total Bilirubin: 0.6 mg/dL (ref 0.3–1.2)
Total Protein: 4.5 g/dL — ABNORMAL LOW (ref 6.5–8.1)

## 2022-04-20 LAB — TSH: TSH: 1.657 u[IU]/mL (ref 0.350–4.500)

## 2022-04-20 LAB — FOLATE: Folate: 30.9 ng/mL (ref >5.9)

## 2022-04-20 LAB — RENAL FUNCTION PANEL
Albumin: 1.9 g/dL — ABNORMAL LOW (ref 3.5–5.0)
Anion gap: 10 (ref 5–15)
BUN: 52 mg/dL — ABNORMAL HIGH (ref 6–20)
CO2: 22 mmol/L (ref 22–32)
Calcium: 6.7 mg/dL — ABNORMAL LOW (ref 8.9–10.3)
Chloride: 96 mmol/L — ABNORMAL LOW (ref 98–111)
Creatinine, Ser: 4.36 mg/dL — ABNORMAL HIGH (ref 0.44–1.00)
GFR, Estimated: 11 mL/min — ABNORMAL LOW (ref >60)
Glucose, Bld: 155 mg/dL — ABNORMAL HIGH (ref 70–99)
Phosphorus: 5.4 mg/dL — ABNORMAL HIGH (ref 2.5–4.6)
Potassium: 4.9 mmol/L (ref 3.5–5.1)
Sodium: 128 mmol/L — ABNORMAL LOW (ref 135–145)

## 2022-04-20 LAB — AMMONIA: Ammonia: 14 umol/L (ref 9–35)

## 2022-04-20 LAB — CK: Total CK: 13704 U/L — ABNORMAL HIGH (ref 38–234)

## 2022-04-20 LAB — PROCALCITONIN: Procalcitonin: 2.95 ng/mL

## 2022-04-20 LAB — VITAMIN B12: Vitamin B-12: 909 pg/mL (ref 180–914)

## 2022-04-20 LAB — T4, FREE: Free T4: 0.56 ng/dL — ABNORMAL LOW (ref 0.61–1.12)

## 2022-04-20 MED ORDER — SODIUM CHLORIDE 0.9 % IV SOLN
1.0000 g | Freq: Two times a day (BID) | INTRAVENOUS | Status: DC
  Administered 2022-04-20 – 2022-04-21 (×3): 1 g via INTRAVENOUS
  Filled 2022-04-20 (×3): qty 20

## 2022-04-20 MED ORDER — IPRATROPIUM-ALBUTEROL 0.5-2.5 (3) MG/3ML IN SOLN
3.0000 mL | Freq: Four times a day (QID) | RESPIRATORY_TRACT | Status: DC | PRN
  Administered 2022-04-22: 3 mL via RESPIRATORY_TRACT
  Filled 2022-04-20: qty 3

## 2022-04-20 MED ORDER — IPRATROPIUM-ALBUTEROL 0.5-2.5 (3) MG/3ML IN SOLN
3.0000 mL | Freq: Two times a day (BID) | RESPIRATORY_TRACT | Status: DC
  Administered 2022-04-20 – 2022-04-22 (×4): 3 mL via RESPIRATORY_TRACT
  Filled 2022-04-20 (×5): qty 3

## 2022-04-20 MED ORDER — FLUCONAZOLE 100 MG PO TABS
200.0000 mg | ORAL_TABLET | Freq: Once | ORAL | Status: AC
  Administered 2022-04-20: 200 mg via ORAL
  Filled 2022-04-20: qty 2

## 2022-04-20 MED ORDER — FLUCONAZOLE 100 MG PO TABS
100.0000 mg | ORAL_TABLET | Freq: Every day | ORAL | Status: DC
  Administered 2022-04-21 – 2022-04-22 (×2): 100 mg via ORAL
  Filled 2022-04-20 (×2): qty 1

## 2022-04-20 MED ORDER — FUROSEMIDE 10 MG/ML IJ SOLN
40.0000 mg | Freq: Once | INTRAMUSCULAR | Status: AC
  Administered 2022-04-20: 40 mg via INTRAVENOUS
  Filled 2022-04-20: qty 4

## 2022-04-20 NOTE — Progress Notes (Signed)
Nursing Progress Note   Patient's husband called for an update on the patient, provided information that patient had been moved from ICU to Med Surg and was in stable condition. Had questions for patient's physician team. Messaged them and gave information relative to concerns including patient's confusion and tremors.   Vitals:   04/20/22 1000 04/20/22 1132  BP: (!) 153/84 129/82  Pulse: 81 80  Resp: 20 20  Temp:  98.3 F (36.8 C)  SpO2: 98% 97%    Luther Bradley RN, BSN 04/20/2022 1:26 PM

## 2022-04-20 NOTE — Progress Notes (Signed)
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  Symptoms have been present for the last 2-3 days and worsening according to patient's husband.  Patient has just completed her antibiotic therapy from most recent admission about 1 week prior to presentation.  The patient was recently admitted to the hospital from 03/26/2022 to 04/05/2022 when she was treated for pneumonia.  She also had an acute pulmonary embolus for which she was treated with apixaban.  She had instructions to continue vancomycin until 04/08/2022.  The patient was followed by infectious disease.  She had a bronchoscopy on 03/30/2022.  Tissue cultures grew Candida, but her clinical presentation was not consistent and the patient was improving without antifungal therapy.  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  This was felt to be also a contaminant. Per patient's husband he went to work and when he contacted patient she did not pick up the phone; on his arrival to home he found patient on the floor confused and unable to stand up.  Unclear if she tripped and fell, fell off her feet from ongoing weakness and deconditioning or ended passing out. In the ED patient met criteria for severe sepsis with elevated WBCs, elevated lactic acid in the 4.7 range, acute renal failure as organ dysfunction and was also tachycardic and tachypneic    Assessment and Plan: Severe sepsis ([place]) - Patient met sepsis criteria at time of admission with elevated WBCs, tachycardia and low-grade  temperature.  Organ dysfunction demonstrated by acute renal failure and a lactic acid of 4.7. -Continue broad-spectrum antibiotic and narrow as appropriate based on culture results. -Source of infection presumed to be either her lungs or UTI. -pt has been resuscitated -Follow inflammatory markers. -continue cefepime;  d/c vanc (renal failure)--start zyvox -6/29 vitals at 1330 97.1--HR 76-RR 22-124/74--95% RA  Acute Metabolic Encephalopathy -multifactorial including AKI, hyponatremia, infectious process, and possible med induced -EEG -check ammonia -B12 -TSH -d/c gabapentin and topiramate -d/c cefepime and start merrem -start fluconazole   AKI -due to hemodynamic changes and rhabdomyolysis -appreciate nephrology -had bicarb drip about 24 hours Baseline creatinine 0.8-1.1 Monitor with serial BMP Serum creatinine now up to 4.37   Acute cystitis without hematuria -Broad-spectrum antibiotic has been initiated -Follow culture results and narrow coverage as needed -Continue aggressive fluid resuscitation. -culture = yeast, started fluconazole   Secondary adrenal insufficiency ([place]) -With mild crisis most likely in the setting of acute infection -Continue higher dosages of prednisone to provide stress dose steroids.   Chemotherapy-induced peripheral neuropathy ([place]) Patient is on gabapentin, Cymbalta, amitriptyline topiramate, trazodone, nucynta   Current chronic use of systemic steroids -Usually 5 mg of prednisone on daily basis -Continue higher dose of prednisone.     Granulomatosis with polyangiitis [facility]) Patient previously follows with rheumatology at [facility], Dr. Allean Found She is on methotrexate every Sunday Rituximab currently on hold secondary to recent sepsis She is chronically on prednisone 5 mg daily -Case discussed with Dr. Elsworth Soho (PCCM) recommend continued treatment so far provided with outpatient follow-up as already arranged with rheumatology service at [facility].    GERD (gastroesophageal reflux disease) -Continue PPI.  Essential hypertension -Continue holding home antihypertensive agents in the setting of acute sepsis and worsening renal function -Blood pressure stable at this point. -Heart healthy diet ordered.   Rhabdomyolysis - Down for multiple hours prior to be found by spouse -Very likely some muscular trauma with fall; images not demonstrating fractures. -Continued fluid resuscitation and follow CK level. -CK peaking [phone]   Transaminitis -Most likely in the setting of sepsis/shock liver and rhabdomyolysis. -Continue aggressive fluid resuscitation and follow LFTs. -Avoid hepatotoxic agents.   Pulmonary embolism (H[facility]) -On chronic Eliquis -Continue anticoagulation.    COVID-19 virus infection - COVID PCR positive at time of admission -Inflammatory markers suggesting bacterial infection -PCT 4.45>>4.17 -Continue steroids -No remdesivir or antiviral in the setting of acute liver dysfunction -Airborne contact precautions/isolation instructed.   Obesity (BMI 30-39.9) -Class I obesity -Body mass index is 33.38 kg/m. -Low calorie diet and portion control discussed patient.   Opioid dependence ([place]) Patient has intrathecal pump with bupivacaine and ziconotide,  --Last refill 03/02/2022 PDMP reviewed -- Patient receives Nucynta 75 mg, #120; last refill 03/17/2022 -- Patient is also on oxycodone 10 mg; #30, last refilled 02/23/2022               Family Communication:   spouse updated 6/29   Consultants:  renal   Code Status:  FULL    DVT Prophylaxis:  apixaban     Procedures: As Listed in Progress Note Above   Antibiotics: Zyvox 6/28>> Cefepime 6/26>>6/29 Merrem 6/29>>            Subjective: Patient is encephalopathic.  Follows one step commands.  Only intermittently answering questions.  Denies cp, sob, abd pain  Objective: Vitals:   04/20/22 0807 04/20/22 0900 04/20/22 1000 04/20/22 1132  BP:   (!) 147/89 (!) 153/84 129/82  Pulse: 84 88 81 80  Resp: (!) 29 (!) $Remo'22 20 20  'YWZav$ Temp:    98.3 F (36.8 C)  TempSrc:    Oral  SpO2: 100% 99% 98% 97%  Weight:      Height:        Intake/Output Summary (Last 24 hours) at 04/20/2022 1334 Last data filed at 04/20/2022 0411 Gross per 24 hour  Intake 592.01 ml  Output 390 ml  Net 202.01 ml   Weight change:  Exam:  General:  Pt is alert, follows commands appropriately, not in acute distress HEENT: No icterus, No thrush, No neck mass, [place]/AT Cardiovascular: RRR, S1/S2, no rubs, no gallops Respiratory: bibasilar rales. No wheeze Abdomen: Soft/+BS, non tender, non distended, no guarding Extremities: No edema, No lymphangitis, No petechiae, No rashes, no synovitis   Data Reviewed: I have personally reviewed following labs and imaging studies Basic Metabolic Panel: Recent Labs  Lab 04/17/22 1124 04/18/22 0326 04/19/22 0333 04/20/22 0311  NA 136 134* 129* 127*  128*  K 4.7 4.8 5.4* 4.9  4.9  CL 106 106 102 94*  96*  CO2 17* 18* 15* 23  22  GLUCOSE 253* 252* 187* 156*  155*  BUN 14 27* 43* 51*  52*  CREATININE 1.73* 2.24* 3.68*  3.57* 4.37*  4.36*  CALCIUM 8.7* 7.8* 7.1* 6.6*  6.7*  PHOS  --   --  4.7* 5.4*   Liver Function Tests: Recent Labs  Lab 04/17/22 1124 04/18/22 0326 04/19/22 0333 04/20/22 0311  AST 202* 933*  --  438*  ALT 54* 202*  --  203*  ALKPHOS 103 76  --  80  BILITOT 0.7 0.5  --  0.6  PROT 5.9* 4.7*  --  4.5*  ALBUMIN 2.9* 2.3* 2.0* 1.9*  1.9*   No results for input(s): "LIPASE", "AMYLASE" in the last 168 hours. No results for input(s): "AMMONIA" in the last 168 hours. Coagulation Profile: Recent Labs  Lab 04/17/22 1124  INR 1.8*   CBC: Recent Labs  Lab 04/17/22 1124 04/18/22 0326 04/20/22 0311  WBC 24.5* 19.1* 12.4*  NEUTROABS 18.4*  --  10.5*  HGB 11.2* 10.1* 8.2*  HCT 38.1 34.3* 25.6*  MCV 90.5 88.9 82.8  PLT 286 271 245   Cardiac Enzymes: Recent Labs  Lab 04/18/22 1300  04/20/22 0311  CKTOTAL 26,433* 13,704*   BNP: Invalid input(s): "POCBNP" CBG: Recent Labs  Lab 04/19/22 1200 04/19/22 1637 04/19/22 2111 04/20/22 0801 04/20/22 1119  GLUCAP 178* 132* 158* 128* 188*   HbA1C: No results for input(s): "HGBA1C" in the last 72 hours. Urine analysis:    Component Value Date/Time   COLORURINE AMBER (A) 04/17/2022 1115   APPEARANCEUR CLOUDY (A) 04/17/2022 1115   LABSPEC 1.019 04/17/2022 1115   PHURINE 5.0 04/17/2022 1115   GLUCOSEU 50 (A) 04/17/2022 1115   HGBUR LARGE (A) 04/17/2022 1115   BILIRUBINUR NEGATIVE 04/17/2022 1115   KETONESUR NEGATIVE 04/17/2022 1115   PROTEINUR 100 (A) 04/17/2022 1115   UROBILINOGEN 0.2 05/30/2013 1112   NITRITE NEGATIVE 04/17/2022 1115   LEUKOCYTESUR LARGE (A) 04/17/2022 1115   Sepsis Labs: $RemoveBefo'@[email or web address]'[id number]$ (procalcitonin:4,lacticidven:4) ) Recent Results (from the past 240 hour(s))  Culture, blood (Routine x 2)     Status: None (Preliminary result)   Collection Time: 04/17/22 11:25 AM   Specimen: Left Antecubital; Blood  Result Value Ref Range Status   Specimen Description LEFT ANTECUBITAL  Final   Special Requests   Final    BOTTLES DRAWN AEROBIC AND ANAEROBIC Blood Culture results may not be optimal due to an inadequate volume of blood received in culture bottles   Culture   Final    NO GROWTH 3 DAYS Performed at [facility], [address] [place], [place] [address]    Report Status PENDING  Incomplete  Culture, blood (Routine x 2)     Status: None (Preliminary result)   Collection Time: 04/17/22 11:34 AM   Specimen: BLOOD LEFT ARM  Result Value Ref Range Status   Specimen Description BLOOD LEFT ARM  Final   Special Requests   Final    BOTTLES DRAWN AEROBIC AND ANAEROBIC Blood Culture adequate volume   Culture   Final    NO GROWTH 3 DAYS Performed at [facility], [address] [place], [place] [address]    Report Status PENDING  Incomplete  Urine Culture     Status: Abnormal   Collection Time:  04/17/22 11:53 AM   Specimen: Urine, Catheterized  Result Value Ref Range Status   Specimen Description   Final    URINE, CATHETERIZED Performed at [facility], [address], [place], [place] [address]    Special Requests   Final    NONE Performed at [facility], [address] [place], [place] [address]    Culture >=100,000 COLONIES/mL YEAST (A)  Final   Report Status 04/19/2022 FINAL  Final  Resp Panel by RT-PCR (Flu A&B, Covid) Anterior Nasal Swab     Status: Abnormal   Collection Time: 04/17/22 11:57 AM   Specimen: Anterior Nasal Swab  Result Value Ref Range Status   SARS Coronavirus 2 by RT PCR POSITIVE (A) NEGATIVE Final    Comment: (NOTE) SARS-CoV-2 target nucleic acids are  DETECTED.  The SARS-CoV-2 RNA is generally detectable in upper respiratory specimens during the acute phase of infection. Positive results are indicative of the presence of the identified virus, but do not rule out bacterial infection or co-infection with other pathogens not detected by the test. Clinical correlation with patient history and other diagnostic information is necessary to determine patient infection status. The expected result is Negative.  Fact Sheet for Patients: EntrepreneurPulse.com.au  Fact Sheet for Healthcare Providers: IncredibleEmployment.be  This test is not yet approved or cleared by the [place] FDA and  has been authorized for detection and/or diagnosis of SARS-CoV-2 by FDA under an Emergency Use Authorization (EUA).  This EUA will remain in effect (meaning this test can be used) for the duration of  the COVID-19 declaration under Section 564(b)(1) of the A ct, 21 U.S.C. section 360bbb-3(b)(1), unless the authorization is terminated or revoked sooner.     Influenza A by PCR NEGATIVE NEGATIVE Final   Influenza B by PCR NEGATIVE NEGATIVE Final    Comment: (NOTE) The Xpert Xpress SARS-CoV-2/FLU/RSV plus assay is intended as an  aid in the diagnosis of influenza from Nasopharyngeal swab specimens and should not be used as a sole basis for treatment. Nasal washings and aspirates are unacceptable for Xpert Xpress SARS-CoV-2/FLU/RSV testing.  Fact Sheet for Patients: EntrepreneurPulse.com.au  Fact Sheet for Healthcare Providers: IncredibleEmployment.be  This test is not yet approved or cleared by the [place] FDA and has been authorized for detection and/or diagnosis of SARS-CoV-2 by FDA under an Emergency Use Authorization (EUA). This EUA will remain in effect (meaning this test can be used) for the duration of the COVID-19 declaration under Section 564(b)(1) of the Act, 21 U.S.C. section 360bbb-3(b)(1), unless the authorization is terminated or revoked.  Performed at [facility], [address] [place], [place] [address]      Scheduled Meds:  apixaban  5 mg Oral BID   Chlorhexidine Gluconate Cloth  6 each Topical Daily   DULoxetine  60 mg Oral BID   [START ON 04/21/2022] fluconazole  100 mg Oral Daily   folic acid  2 mg Oral Daily   guaiFENesin-dextromethorphan  10 mL Oral Q12H   insulin aspart  0-15 Units Subcutaneous TID WC   insulin glargine-yfgn  10 Units Subcutaneous QHS   ipratropium-albuterol  3 mL Nebulization BID   linaclotide  145 mcg Oral QAC breakfast   pantoprazole  40 mg Oral Daily   predniSONE  20 mg Oral BID WC   senna  2 tablet Oral BID   tapentadol  50 mg Oral Q12H   vitamin B-12  1,000 mcg Oral Daily   Continuous Infusions:  linezolid (ZYVOX) IV 600 mg (04/20/22 0920)    Procedures/Studies: US RENAL  Result Date: 04/18/2022 CLINICAL DATA:  Renal failure EXAM: RENAL / URINARY TRACT ULTRASOUND COMPLETE COMPARISON:  CT 07/09/2016 FINDINGS: Right Kidney: Renal measurements: 10.3 x 6.4 x 6.0 cm = volume: 207 mL. Echogenicity within normal limits. No mass or hydronephrosis visualized. Left Kidney: Renal measurements: 10.9 x 6.2 x 4.7 cm =  volume: 166 mL. Echogenicity within normal limits. No mass or hydronephrosis visualized. Bladder: Decompressed by Foley catheter. Other: None. IMPRESSION: No evidence of obstructive uropathy. Electronically Signed   By: Davina Poke D.O.   On: 04/18/2022 14:05   CT Head Wo Contrast  Result Date: 04/17/2022 CLINICAL DATA:  Mental status changes, increased generalized weakness, incontinence, unknown etiology; history diabetes mellitus, Wegner's granulomatosis, essential hypertension EXAM: CT HEAD WITHOUT CONTRAST CT  CERVICAL SPINE WITHOUT CONTRAST TECHNIQUE: Multidetector CT imaging of the head and cervical spine was performed following the standard protocol without intravenous contrast. Multiplanar CT image reconstructions of the cervical spine were also generated. RADIATION DOSE REDUCTION: This exam was performed according to the departmental dose-optimization program which includes automated exposure control, adjustment of the mA and/or kV according to patient size and/or use of iterative reconstruction technique. COMPARISON:  CT head 03/26/2022 FINDINGS: CT HEAD FINDINGS Brain: Asymmetry of the lateral ventricles, LEFT larger than RIGHT, little changed from previous exam. No midline shift or mass effect. 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 No intracranial hemorrhage, mass lesion, or evidence of acute infarction. No extra-axial fluid collections. Vascular: No hyperdense vessels Skull: Hyperostosis frontalis interna.  Calvaria intact. Sinuses/Orbits: Clear Other: N/A CT CERVICAL SPINE FINDINGS Alignment: Normal Skull base and vertebrae: Osseous mineralization normal. Scattered facet degenerative changes. Mild disc space narrowing and endplate spur formation at C5-C6. No fracture, subluxation, or bone destruction. Visualized skull base intact. Soft tissues and spinal canal: Prevertebral soft tissues normal thickness. Disc levels:  No additional abnormalities Upper chest:  Tips of lung apices clear Other: N/A IMPRESSION: No acute intracranial abnormalities. 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 Degenerative disc and facet disease changes of the cervical spine. No acute cervical spine abnormalities. Electronically Signed   By: Lavonia Dana M.D.   On: 04/17/2022 12:41   CT Cervical Spine Wo Contrast  Result Date: 04/17/2022 CLINICAL DATA:  Mental status changes, increased generalized weakness, incontinence, unknown etiology; history diabetes mellitus, Wegner's granulomatosis, essential hypertension EXAM: CT HEAD WITHOUT CONTRAST CT CERVICAL SPINE WITHOUT CONTRAST TECHNIQUE: Multidetector CT imaging of the head and cervical spine was performed following the standard protocol without intravenous contrast. Multiplanar CT image reconstructions of the cervical spine were also generated. RADIATION DOSE REDUCTION: This exam was performed according to the departmental dose-optimization program which includes automated exposure control, adjustment of the mA and/or kV according to patient size and/or use of iterative reconstruction technique. COMPARISON:  CT head 03/26/2022 FINDINGS: CT HEAD FINDINGS Brain: Asymmetry of the lateral ventricles, LEFT larger than RIGHT, little changed from previous exam. No midline shift or mass effect. 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 No intracranial hemorrhage, mass lesion, or evidence of acute infarction. No extra-axial fluid collections. Vascular: No hyperdense vessels Skull: Hyperostosis frontalis interna.  Calvaria intact. Sinuses/Orbits: Clear Other: N/A CT CERVICAL SPINE FINDINGS Alignment: Normal Skull base and vertebrae: Osseous mineralization normal. Scattered facet degenerative changes. Mild disc space narrowing and endplate spur formation at C5-C6. No fracture, subluxation, or bone destruction. Visualized skull base intact. Soft tissues and spinal canal: Prevertebral soft tissues normal  thickness. Disc levels:  No additional abnormalities Upper chest: Tips of lung apices clear Other: N/A IMPRESSION: No acute intracranial abnormalities. 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 Degenerative disc and facet disease changes of the cervical spine. No acute cervical spine abnormalities. Electronically Signed   By: Lavonia Dana M.D.   On: 04/17/2022 12:41   DG Thoracic Spine 2 View  Result Date: 04/17/2022 CLINICAL DATA:  Trauma, fall, pain EXAM: THORACIC SPINE 2 VIEWS COMPARISON:  None Available. FINDINGS: No recent fracture is seen. There is mild dextroscoliosis in the upper thoracic spine. Alignment of posterior margins of vertebral bodies is unremarkable. Degenerative changes are noted with anterior bony spurs at multiple levels. Paraspinal soft tissues are unremarkable. IMPRESSION: No recent fracture is seen in the thoracic spine. Degenerative changes  are noted with anterior bony spurs. Electronically Signed   By: Elmer Picker M.D.   On: 04/17/2022 12:38   DG Chest 1 View  Result Date: 04/17/2022 CLINICAL DATA:  Granulomatosis with polyangiitis. EXAM: CHEST  1 VIEW COMPARISON:  One-view chest x-ray 04/03/2022. FINDINGS: Heart size exaggerated by low lung volumes. Progressive interstitial pattern involves the left lung and right upper lobe. Surgical clips are present in the abdomen. IMPRESSION: Progressive interstitial pattern left lung and right upper lobe. This may represent edema or infection. Electronically Signed   By: San Morelle M.D.   On: 04/17/2022 12:38   DG Lumbar Spine Complete  Result Date: 04/17/2022 CLINICAL DATA:  Trauma, fall EXAM: LUMBAR SPINE - COMPLETE 4+ VIEW COMPARISON:  None Available. FINDINGS: No recent fracture is seen. There is no significant disc space narrowing. There is minimal anterolisthesis at L4-L5 level. Degenerative changes are noted with small anterior bony spurs and facet hypertrophy more so in the lower lumbar spine.  There is an electronic device in the subcutaneous plane in the anterior abdominal wall which is partly obscuring the bony structures in the 1 of the views. IMPRESSION: No recent fracture is seen in the lumbar spine. Lumbar spondylosis, more so in the facet joints in the lower lumbar spine. There is mild anterolisthesis at L4-L5 level. Electronically Signed   By: Elmer Picker M.D.   On: 04/17/2022 12:37   DG Foot 2 Views Right  Result Date: 04/03/2022 CLINICAL DATA:  Right foot ulcer EXAM: RIGHT FOOT - 2 VIEW COMPARISON:  None Available. FINDINGS: No acute fracture or dislocation. Prior transmetatarsal amputation. Small plantar calcaneal spur. Enthesopathic changes of the Achilles tendon insertion. No aggressive osseous lesion. Normal alignment. Soft tissue are unremarkable. No radiopaque foreign body or soft tissue emphysema. IMPRESSION: 1. Prior transmetatarsal amputation. 2. No evidence of osteomyelitis of the right foot. Electronically Signed   By: Kathreen Devoid M.D.   On: 04/03/2022 15:45   DG Chest Port 1 View  Result Date: 04/03/2022 CLINICAL DATA:  Severe sepsis, shortness of breath EXAM: PORTABLE CHEST 1 VIEW COMPARISON:  Radiograph 03/30/2022 FINDINGS: Note that the exam is mislabeled right versus left. There is a right upper extremity PICC with tip overlying the distal superior vena cava. Unchanged cardiomediastinal silhouette. Interval improvement in bilateral airspace opacities with faint residual opacity seen in the right lung. No large effusion. No pneumothorax. No acute osseous abnormality. IMPRESSION: Interval improvement in bilateral airspace disease, with mild residual opacities in the right lung. Electronically Signed   By: Maurine Simmering M.D.   On: 04/03/2022 08:36   [place] EKG SITE RITE  Result Date: 03/31/2022 If Site Rite image not attached, placement could not be confirmed due to current cardiac rhythm.  VAS [place] LOWER EXTREMITY VENOUS (DVT)  Result Date: 03/30/2022  Lower Venous  DVT Study Patient Name:  [name]  Date of Exam:   03/30/2022 Medical Rec #: [id number]    Accession #:    [id number] Date of Birth: 10-Nov-1965    Patient Gender: F Patient Age:   57 years Exam Location:  [facility] Procedure:      VAS [place] LOWER EXTREMITY VENOUS (DVT) Referring Phys: Deno Etienne [facility] --------------------------------------------------------------------------------  Indications: Swelling, pulmonary embolism, and Elevated D-dimer.  Performing Technologist: Bobetta Lime  Examination Guidelines: A complete evaluation includes B-mode imaging, spectral Doppler, color Doppler, and power Doppler as needed of all accessible portions of each vessel. Bilateral testing is considered an integral part of a complete examination. Limited examinations  for reoccurring indications may be performed as noted. The reflux portion of the exam is performed with the patient in reverse Trendelenburg.  +---------+---------------+---------+-----------+----------+--------------+ RIGHT    CompressibilityPhasicitySpontaneityPropertiesThrombus Aging +---------+---------------+---------+-----------+----------+--------------+ CFV      Full           Yes      Yes                                 +---------+---------------+---------+-----------+----------+--------------+ SFJ      Full                                                        +---------+---------------+---------+-----------+----------+--------------+ FV Prox  Full                                                        +---------+---------------+---------+-----------+----------+--------------+ FV Mid   Full                                                        +---------+---------------+---------+-----------+----------+--------------+ FV DistalFull                                                        +---------+---------------+---------+-----------+----------+--------------+ PFV      Full                                                         +---------+---------------+---------+-----------+----------+--------------+ POP      Full           Yes      Yes                                 +---------+---------------+---------+-----------+----------+--------------+ PTV      Full                                                        +---------+---------------+---------+-----------+----------+--------------+ PERO     Full                                                        +---------+---------------+---------+-----------+----------+--------------+   +---------+---------------+---------+-----------+----------+--------------+ LEFT     CompressibilityPhasicitySpontaneityPropertiesThrombus Aging +---------+---------------+---------+-----------+----------+--------------+ CFV      Full  Yes      Yes                                 +---------+---------------+---------+-----------+----------+--------------+ SFJ      Full                                                        +---------+---------------+---------+-----------+----------+--------------+ FV Prox  Full                                                        +---------+---------------+---------+-----------+----------+--------------+ FV Mid   Full                                                        +---------+---------------+---------+-----------+----------+--------------+ FV DistalFull                                                        +---------+---------------+---------+-----------+----------+--------------+ PFV      Full                                                        +---------+---------------+---------+-----------+----------+--------------+ POP      Full           Yes      Yes                                 +---------+---------------+---------+-----------+----------+--------------+ PTV      Full                                                         +---------+---------------+---------+-----------+----------+--------------+ PERO     Full                                                        +---------+---------------+---------+-----------+----------+--------------+     Summary: RIGHT: - There is no evidence of deep vein thrombosis in the lower extremity.  - No cystic structure found in the popliteal fossa.  LEFT: - There is no evidence of deep vein thrombosis in the lower extremity.  - No cystic structure found in the popliteal fossa.  *See table(s) above for measurements and observations. Electronically signed by Monica Martinez MD on 03/30/2022 at 4:47:59  PM.    Final    DG CHEST PORT 1 VIEW  Result Date: 03/30/2022 CLINICAL DATA:  Status post right lung biopsy EXAM: PORTABLE CHEST 1 VIEW COMPARISON:  03/29/2022 FINDINGS: Unchanged AP portable chest radiograph with subtle right greater than left upper lobe heterogeneous airspace opacities and probable small, layering bilateral pleural effusions. No new airspace opacity. No pneumothorax. Mild cardiomegaly. The visualized skeletal structures are unremarkable. IMPRESSION: Unchanged AP portable chest radiograph with subtle right greater than left upper lobe heterogeneous airspace opacities and probable small, layering bilateral pleural effusions. No new airspace opacity. No pneumothorax following biopsy. Electronically Signed   By: Delanna Ahmadi M.D.   On: 03/30/2022 13:24   DG ESOPHAGUS W SINGLE CM (SOL OR THIN BA)  Result Date: 03/30/2022 CLINICAL DATA:  Patient complaining of frequent globus sensation, regurgitation, and history of esophageal stricture. EXAM: ESOPHAGUS/BARIUM SWALLOW/TABLET STUDY TECHNIQUE: Single contrast examination was performed using thin liquid barium. This exam was performed by Gareth Eagle, PA-C, and was supervised and interpreted by Dr. Fabiola Backer. FLUOROSCOPY: Radiation Exposure Index (as provided by the fluoroscopic device): 25.8 mGy Kerma COMPARISON:  CT chest dated  March 27, 2022. FINDINGS: Study was limited secondary to patient's mobility issues and inability to stand. Swallowing: No aspiration seen. Esophagus: Smooth focal fixed narrowing of the distal esophagus just superior to the GE junction. No mucosal irregularity or defect. Esophageal motility: Mild dysmotility manifested by intermittent tertiary contractions. Hiatal Hernia: None. Gastroesophageal reflux: None visualized. Ingested 46mm barium tablet: Became stuck and did not pass beyond the gastroesophageal junction despite giving water and barium. Other: None. IMPRESSION: 1. Short-segment stricture of the distal esophagus just superior to the GE junction which did not allow passage of a barium tablet. This is likely reflux related, although no reflux was elicited on today's study. 2. Mild esophageal dysmotility. Electronically Signed   By: Titus Dubin M.D.   On: 03/30/2022 09:27   DG Chest Port 1 View  Result Date: 03/29/2022 CLINICAL DATA:  Shortness of breath and cough. EXAM: PORTABLE CHEST 1 VIEW COMPARISON:  03/26/2022 FINDINGS: Stable cardiomediastinal contours. Lungs are suboptimally inflated. No signs of pleural effusion or edema. Persistent asymmetric airspace opacification within the right upper lobe. IMPRESSION: Persistent asymmetric airspace opacification within the right upper lobe compatible with pneumonia. Electronically Signed   By: Kerby Moors M.D.   On: 03/29/2022 06:43   ECHOCARDIOGRAM COMPLETE  Result Date: 03/28/2022    ECHOCARDIOGRAM REPORT   Patient Name:   [name] Date of Exam: 03/28/2022 Medical Rec #:  [id number]   Height:       64.0 in Accession #:    [id number]  Weight:       211.4 lb Date of Birth:  May 01, 1966   BSA:          2.003 m Patient Age:    51 years    BP:           121/84 mmHg Patient Gender: F           HR:           86 bpm. Exam Location:  [facility] Procedure: 2D Echo, Cardiac Doppler and Color Doppler Indications:    Pulmonary embolus  History:        Patient has  no prior history of Echocardiogram examinations.                 Risk Factors:Hypertension and Dyslipidemia.  Sonographer:    Wenda Low Referring Phys: [phone] Lanaiya Lantry  IMPRESSIONS  1. Left ventricular ejection fraction, by estimation, is 55%. The left ventricle has normal function. The left ventricle has no regional wall motion abnormalities. Left ventricular diastolic parameters were normal.  2. Right ventricular systolic function is normal. The right ventricular size is normal. There is normal pulmonary artery systolic pressure.  3. The pericardial effusion is anterior to the right ventricle.  4. The mitral valve is abnormal. Trivial mitral valve regurgitation. No evidence of mitral stenosis.  5. The aortic valve is tricuspid. There is mild calcification of the aortic valve. Aortic valve regurgitation is not visualized. Aortic valve sclerosis is present, with no evidence of aortic valve stenosis.  6. The inferior vena cava is normal in size with greater than 50% respiratory variability, suggesting right atrial pressure of 3 mmHg. FINDINGS  Left Ventricle: Left ventricular ejection fraction, by estimation, is 55%. The left ventricle has normal function. The left ventricle has no regional wall motion abnormalities. The left ventricular internal cavity size was normal in size. There is no left ventricular hypertrophy. Left ventricular diastolic parameters were normal. Right Ventricle: The right ventricular size is normal. No increase in right ventricular wall thickness. Right ventricular systolic function is normal. There is normal pulmonary artery systolic pressure. The tricuspid regurgitant velocity is 1.76 m/s, and  with an assumed right atrial pressure of 3 mmHg, the estimated right ventricular systolic pressure is 48.1 mmHg. Left Atrium: Left atrial size was normal in size. Right Atrium: Right atrial size was normal in size. Pericardium: Trivial pericardial effusion is present. The pericardial effusion is  anterior to the right ventricle. Mitral Valve: The mitral valve is abnormal. There is mild thickening of the mitral valve leaflet(s). There is mild calcification of the mitral valve leaflet(s). Trivial mitral valve regurgitation. No evidence of mitral valve stenosis. MV peak gradient, 6.5 mmHg. The mean mitral valve gradient is 3.0 mmHg. Tricuspid Valve: The tricuspid valve is normal in structure. Tricuspid valve regurgitation is trivial. No evidence of tricuspid stenosis. Aortic Valve: The aortic valve is tricuspid. There is mild calcification of the aortic valve. Aortic valve regurgitation is not visualized. Aortic valve sclerosis is present, with no evidence of aortic valve stenosis. Aortic valve mean gradient measures 5.0 mmHg. Aortic valve peak gradient measures 10.8 mmHg. Aortic valve area, by VTI measures 2.22 cm. Pulmonic Valve: The pulmonic valve was normal in structure. Pulmonic valve regurgitation is not visualized. No evidence of pulmonic stenosis. Aorta: The aortic root is normal in size and structure. Venous: The inferior vena cava is normal in size with greater than 50% respiratory variability, suggesting right atrial pressure of 3 mmHg. IAS/Shunts: No atrial level shunt detected by color flow Doppler.  LEFT VENTRICLE PLAX 2D LVIDd:         4.90 cm     Diastology LVIDs:         3.50 cm     LV e' medial:    7.72 cm/s LV PW:         1.00 cm     LV E/e' medial:  15.2 LV IVS:        0.90 cm     LV e' lateral:   14.60 cm/s LVOT diam:     2.00 cm     LV E/e' lateral: 8.0 LV SV:         76 LV SV Index:   38 LVOT Area:     3.14 cm  LV Volumes (MOD) LV vol d, MOD A2C: 60.2 ml LV vol d,  MOD A4C: 49.4 ml LV vol s, MOD A2C: 24.4 ml LV vol s, MOD A4C: 23.8 ml LV SV MOD A2C:     35.8 ml LV SV MOD A4C:     49.4 ml LV SV MOD BP:      30.8 ml RIGHT VENTRICLE RV Basal diam:  3.15 cm RV Mid diam:    2.80 cm RV S prime:     13.40 cm/s TAPSE (M-mode): 2.3 cm LEFT ATRIUM             Index        RIGHT ATRIUM            Index LA diam:        3.70 cm 1.85 cm/m   RA Area:     15.50 cm LA Vol (A2C):   46.0 ml 22.96 ml/m  RA Volume:   37.70 ml  18.82 ml/m LA Vol (A4C):   50.9 ml 25.41 ml/m LA Biplane Vol: 50.3 ml 25.11 ml/m  AORTIC VALVE                     PULMONIC VALVE AV Area (Vmax):    2.38 cm      PV Vmax:       0.90 m/s AV Area (Vmean):   2.30 cm      PV Peak grad:  3.2 mmHg AV Area (VTI):     2.22 cm AV Vmax:           164.00 cm/s AV Vmean:          107.000 cm/s AV VTI:            0.344 m AV Peak Grad:      10.8 mmHg AV Mean Grad:      5.0 mmHg LVOT Vmax:         124.00 cm/s LVOT Vmean:        78.500 cm/s LVOT VTI:          0.243 m LVOT/AV VTI ratio: 0.71  AORTA Ao Root diam: 2.90 cm MITRAL VALVE                TRICUSPID VALVE MV Area (PHT): 3.68 cm     TR Peak grad:   12.4 mmHg MV Area VTI:   2.13 cm     TR Vmax:        176.00 cm/s MV Peak grad:  6.5 mmHg MV Mean grad:  3.0 mmHg     SHUNTS MV Vmax:       1.27 m/s     Systemic VTI:  0.24 m MV Vmean:      77.6 cm/s    Systemic Diam: 2.00 cm MV Decel Time: 206 msec MV E velocity: 117.00 cm/s MV A velocity: 89.50 cm/s MV E/A ratio:  1.31 Jenkins Rouge MD Electronically signed by Jenkins Rouge MD Signature Date/Time: 03/28/2022/12:20:15 PM    Final    CT Angio Chest Pulmonary Embolism (PE) W or WO Contrast  Result Date: 03/27/2022 CLINICAL DATA:  Chest pain and shortness of breath for several weeks, recent pneumonia EXAM: CT ANGIOGRAPHY CHEST WITH CONTRAST TECHNIQUE: Multidetector CT imaging of the chest was performed using the standard protocol during bolus administration of intravenous contrast. Multiplanar CT image reconstructions and MIPs were obtained to evaluate the vascular anatomy. RADIATION DOSE REDUCTION: This exam was performed according to the departmental dose-optimization program which includes automated exposure control, adjustment of the mA and/or kV according to patient size and/or  use of iterative reconstruction technique. CONTRAST:  95mL OMNIPAQUE  IOHEXOL 300 MG/ML  SOLN IV COMPARISON:  CT chest 01/04/2022 FINDINGS: Cardiovascular: Aorta normal caliber without aneurysm or dissection. Proximal great vessels unremarkable. Heart normal appearance. No pericardial effusion. Pulmonary arteries adequately opacified. Two small filling defects are identified consistent with pulmonary embolism, in RIGHT lower lobe image 214 and in RIGHT middle lobe image 160. No additional emboli. Mediastinum/Nodes: Esophagus unremarkable. Few normal sized mediastinal lymph nodes. No thoracic adenopathy. Base of cervical region normal appearance. Lungs/Pleura: Small BILATERAL pleural effusions. BILATERAL pulmonary infiltrates, greatest in RIGHT upper lobe. No pneumothorax or mass. Upper Abdomen: Gallbladder surgically absent. Splenic deformity question related to prior infarct or surgery. Musculoskeletal: No acute osseous findings. Review of the MIP images confirms the above findings. IMPRESSION: Small pulmonary emboli in RIGHT lung. Small BILATERAL pleural effusions. Patchy BILATERAL pulmonary infiltrates consistent with multifocal pneumonia greatest in RIGHT upper lobe. Critical Value/emergent results were called by telephone at the time of interpretation on 03/27/2022 at 1510 hrs to provider Hamlet Lasecki MD, who verbally acknowledged these results. Electronically Signed   By: Lavonia Dana M.D.   On: 03/27/2022 15:13   CT Head Wo Contrast  Result Date: 03/26/2022 CLINICAL DATA:  Altered mental status.  Recent stroke. EXAM: CT HEAD WITHOUT CONTRAST TECHNIQUE: Contiguous axial images were obtained from the base of the skull through the vertex without intravenous contrast. RADIATION DOSE REDUCTION: This exam was performed according to the departmental dose-optimization program which includes automated exposure control, adjustment of the mA and/or kV according to patient size and/or use of iterative reconstruction technique. COMPARISON:  11/01/2020 FINDINGS: Brain: No evidence of  intracranial hemorrhage, acute infarction, hydrocephalus, extra-axial collection, or mass lesion/mass effect. 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 Vascular:  No hyperdense vessel or other acute findings. Skull: No evidence of fracture or other significant bone abnormality. Sinuses/Orbits:  No acute findings. Other: None. IMPRESSION: No acute intracranial abnormality. Electronically Signed   By: Marlaine Hind M.D.   On: 03/26/2022 11:42   DG Chest Port 1 View  Result Date: 03/26/2022 CLINICAL DATA:  Sepsis. EXAM: PORTABLE CHEST 1 VIEW COMPARISON:  February 19, 2022 FINDINGS: There is infiltrate in the right mid lung. There may be infiltrate in the medial left upper lung. No pneumothorax. No other acute interval changes. The study is limited however due to portable technique. IMPRESSION: The study is limited due to low volume portable technique. However, there is an infiltrate in the right mid lung consistent with for pneumonia given history. There may be a more subtle smaller infiltrate in the medial left upper lung. Electronically Signed   By: Dorise Bullion III M.D.   On: 03/26/2022 10:22    Orson Eva, DO  [facility]  If 7PM-7AM, please contact night-coverage [email or web address] Password [email or web address] 04/20/2022, 1:34 PM   LOS: 3 days

## 2022-04-20 NOTE — Progress Notes (Signed)
Subjective:  UOP staying between 500-600- crt worsening -  is 2.2 liters positive-   CK trending down -  acidosis improved - still on room air -  BP up -  urine has grown yeast-- she is alert but anxious today -  repeating herself but was able to answer her phone   Objective Vital signs in last 24 hours: Vitals:   04/20/22 0100 04/20/22 0400 04/20/22 0756 04/20/22 0807  BP: 130/76     Pulse: 64   84  Resp: 16   (!) 29  Temp:  97.8 F (36.6 C) 98.4 F (36.9 C)   TempSrc:  Oral Oral   SpO2: 96%   100%  Weight:      Height:       Weight change:   Intake/Output Summary (Last 24 hours) at 04/20/2022 0934 Last data filed at 04/20/2022 0411 Gross per 24 hour  Intake 959.21 ml  Output 510 ml  Net 449.21 ml    Assessment/Plan: 56 year old WF with many medical issues-  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  She now presents with a leukocytosis and probable UTI-  but also complicated by what appears to be rhabdo and AKI-  I suspect she is not that mobile at home and has led to this picture.  Treatment of UTI is underway-  given elevated CK and low bicarb and high K will hydrate aggressively for the next 24 hours with bicarb based fluids to assist with rhabdo-  follow CK and renal function closely -  no current indications for RRT.  DO NOT think this is vasculitis involvement of kidneys.  Now urine culture grew out yeast-   will need treatment for that.  Tank seems full and BP up so will stop aggressive IVF-  still non oliguric but renal function has not turned around quite yet 2. Hypertension/volume  - initially low BP-  better now-  on prednisone-  on room air so hydrated aggressively to treat rhabdo but need to watch for signs of volume overload -  now with edema and sat is lower-  stop  aggressive IVF and give dose of lasix 3. ID-  presumed UTI-  on broad spectrum abx given IS state-  cultures pending -- is back now - has yeast -  will cover for that  4. Anemia  - present and worsening in the setting of hydration-  follow clinically for now 5. Hyponatremia-  think now is consistent with hypervolemic hyponatremia-  will give some diuretic      Cecille Aver    Labs: Basic Metabolic Panel: Recent Labs  Lab 04/18/22 0326 04/19/22 0333 04/20/22 0311  NA 134* 129* 127*  128*  K 4.8 5.4* 4.9  4.9  CL 106 102 94*  96*  CO2 18* 15* 23  22  GLUCOSE 252* 187* 156*  155*  BUN 27* 43* 51*  52*  CREATININE 2.24* 3.68*  3.57* 4.37*  4.36*  CALCIUM 7.8* 7.1* 6.6*  6.7*  PHOS  --  4.7* 5.4*   Liver Function Tests: Recent Labs  Lab 04/17/22 1124 04/18/22 0326 04/19/22 0333 04/20/22 0311  AST 202* 933*  --  438*  ALT 54* 202*  --  203*  ALKPHOS 103 76  --  80  BILITOT 0.7 0.5  --  0.6  PROT 5.9* 4.7*  --  4.5*  ALBUMIN 2.9* 2.3* 2.0* 1.9*  1.9*   No results for input(s): "LIPASE", "AMYLASE" in the last 168 hours. No results for input(s): "AMMONIA" in the last 168 hours. CBC: Recent Labs  Lab 04/17/22 1124 04/18/22 0326 04/20/22 0311  WBC 24.5* 19.1* 12.4*  NEUTROABS 18.4*  --  10.5*  HGB 11.2* 10.1* 8.2*  HCT 38.1 34.3* 25.6*  MCV 90.5 88.9 82.8  PLT 286 271 245   Cardiac Enzymes: Recent Labs  Lab 04/18/22 1300 04/20/22 0311  CKTOTAL 26,433* 13,704*   CBG: Recent Labs  Lab 04/19/22 0826 04/19/22 1200 04/19/22 1637 04/19/22 2111 04/20/22 0801  GLUCAP 139* 178* 132* 158* 128*    Iron Studies: No results for input(s): "IRON", "TIBC", "TRANSFERRIN", "FERRITIN" in the last 72 hours. Studies/Results: US RENAL  Result Date: 04/18/2022 CLINICAL DATA:  Renal failure EXAM: RENAL / URINARY TRACT ULTRASOUND COMPLETE COMPARISON:  CT 07/09/2016 FINDINGS: Right Kidney: Renal measurements: 10.3 x 6.4 x 6.0 cm = volume: 207 mL.  Echogenicity within normal limits. No mass or hydronephrosis visualized. Left Kidney: Renal measurements: 10.9 x 6.2 x 4.7 cm = volume: 166 mL. Echogenicity within normal limits. No mass or hydronephrosis visualized. Bladder: Decompressed by Foley catheter. Other: None. IMPRESSION: No evidence of obstructive uropathy. Electronically Signed   By: Duanne Guess D.O.   On: 04/18/2022 14:05   Medications: Infusions:  ceFEPime (MAXIPIME) IV     linezolid (ZYVOX) IV 600 mg (04/20/22 0920)   sodium bicarbonate 150 mEq in sterile water 1,150 mL infusion 200 mL/hr at 04/20/22 0030    Scheduled Medications:  apixaban  5 mg Oral BID   Chlorhexidine Gluconate Cloth  6 each Topical Daily   DULoxetine  60 mg Oral BID   folic acid  2 mg Oral Daily   gabapentin  100 mg Oral TID   guaiFENesin-dextromethorphan  10 mL Oral Q12H   insulin aspart  0-15 Units Subcutaneous TID WC   insulin glargine-yfgn  10 Units Subcutaneous QHS   ipratropium-albuterol  3 mL Nebulization TID   linaclotide  145 mcg Oral QAC breakfast   pantoprazole  40 mg Oral Daily   predniSONE  20 mg Oral BID WC   senna  2 tablet Oral BID   tapentadol  50 mg Oral Q12H   vitamin B-12  1,000 mcg Oral Daily    have reviewed scheduled and prn medications.  Physical Exam: General: alert, wide eyed-  some repeating of speech-  non focal Heart:RRR Lungs: poor effort, dec BS at bases Abdomen: obese, soft non tender Extremities: pitting edema now-  bilat TMA    04/20/2022,9:34 AM  LOS: 3 days

## 2022-04-20 NOTE — Progress Notes (Signed)
Pharmacy Antibiotic Note  [name] is a 56 y.o. female admitted on 04/17/2022 with  unknown source of infection .  Pharmacy has been consulted for Vancomycin and cefepime dosing. 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 She has had recurrent pneumonia/pneumonitis and etiology remains unclear. She is followed in ID clinic. AKI. AF, PCT 2.95. Sepsis presumed to be either her lungs or UTI. Vancomycin stopped d/t renal fx., changed to zyvox. D#4 of abx  Plan: Continue Cefepime 2gm IV q24h Also on linezolid 600mg   IV q12h F/U cxs and clinical progress Monitor V/S, labs and LOT  Height: 5\' 5"  (165.1 cm) Weight: 91 kg (200 lb 9.9 oz) IBW/kg (Calculated) : 57  Temp (24hrs), Avg:97.9 F (36.6 C), Min:97.6 F (36.4 C), Max:98.4 F (36.9 C)  Recent Labs  Lab 04/17/22 1124 04/17/22 1125 04/17/22 1248 04/17/22 1756 04/17/22 2028 04/18/22 0326 04/19/22 0333 04/20/22 0311  WBC 24.5*  --   --   --   --  19.1*  --  12.4*  CREATININE 1.73*  --   --   --   --  2.24* 3.68*  3.57* 4.37*  4.36*  LATICACIDVEN  --  4.7* 4.0* 3.9* 3.9*  --   --   --      Estimated Creatinine Clearance: 16.1 mL/min (A) (by C-G formula based on SCr of 4.36 mg/dL (H)).    Allergies  Allergen Reactions   Ibuprofen Other (See Comments)    Pt is unable to take this due to kidney problems.      Penicillins Rash and Other (See Comments)    Has patient had a PCN reaction causing immediate rash, facial/tongue/throat swelling, SOB or lightheadedness with hypotension: No Has patient had a PCN reaction causing severe rash involving mucus membranes or skin necrosis: No Has patient had a PCN reaction that required hospitalization No Has patient had a PCN reaction occurring within the last 10 years: No If all of the above answers are "NO", then may proceed with Cephalosporin use.    Antimicrobials this admission: vancomycin 6/26 >> 6/28 cefepime 6/26 >>  Linezolid 6/28>> Fluconazole 6/29 >> Flagyl 6/26 x 1  dose in ED  Microbiology results: 6/26 BCx: ngtd 6/26 7/26, 100K CFU/ml COVID + (negative 3 weeks ago)    Thank you for allowing pharmacy to be a part of this patient's care.  7/26, BS Pharm D, BCPS Clinical Pharmacist 04/20/2022 1:11 PM

## 2022-04-20 NOTE — Procedures (Signed)
Patient Name: [name]  MRN: [id number]  Epilepsy Attending: Charlsie Quest  Referring Physician/Provider: Catarina Hartshorn, MD  Date: 04/20/2022 Duration: 24.13 mins  Patient history: 56yo F with ams. EEG to evaluate for seizure.  Level of alertness: Awake  AEDs during EEG study: None  Technical aspects: This EEG study was done with scalp electrodes positioned according to the 10-20 International system of electrode placement. Electrical activity was acquired at a sampling rate of 500Hz  and reviewed with a high frequency filter of 70Hz  and a low frequency filter of 1Hz . EEG data were recorded continuously and digitally stored.   Description: No clear posterior dominant rhythm was seen. EEG showed continuous generalized predominantly 5-7 Hz theta slowing admixed with intermittent generalized 2-3hz  delta slowing. Patient was noted to have brief intermittent whole body twitching as well as asynchronous and non- rhythmic twitching of arms and legs. Concomitant EEG before, during and after the event did not show any EEG changes suggest seizure. Hyperventilation and photic stimulation were not performed.     ABNORMALITY - Continuous slow, generalized  IMPRESSION: This study is suggestive of moderate diffuse encephalopathy, nonspecific etiology. No seizures or epileptiform discharges were seen throughout the recording.  Patient was noted to have brief intermittent whole body twitching as well as asynchronous and non- rhythmic twitching of arms and legs without concomitant EEG change. These episodes were NOT epileptic.   Eean Buss 

## 2022-04-20 NOTE — Progress Notes (Signed)
Pharmacy Antibiotic Note  [name] is a 56 y.o. female admitted on 04/17/2022 with  unknown source of infection .  Pharmacy has been consulted for merrem dosing. 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 She has had recurrent pneumonia/pneumonitis and etiology remains unclear. She is followed in ID clinic. AKI. AF, PCT 2.95. Sepsis presumed to be either her lungs or UTI. Vancomycin stopped d/t renal fx., changed to zyvox. D#4 of abx Now changing to merrem  Plan: Merrem 1gm IV q12h Also on linezolid 600mg   IV q12h F/U cxs and clinical progress Monitor V/S, labs and LOT  Height: 5\' 5"  (165.1 cm) Weight: 91 kg (200 lb 9.9 oz) IBW/kg (Calculated) : 57  Temp (24hrs), Avg:97.9 F (36.6 C), Min:97.6 F (36.4 C), Max:98.4 F (36.9 C)  Recent Labs  Lab 04/17/22 1124 04/17/22 1125 04/17/22 1248 04/17/22 1756 04/17/22 2028 04/18/22 0326 04/19/22 0333 04/20/22 0311  WBC 24.5*  --   --   --   --  19.1*  --  12.4*  CREATININE 1.73*  --   --   --   --  2.24* 3.68*  3.57* 4.37*  4.36*  LATICACIDVEN  --  4.7* 4.0* 3.9* 3.9*  --   --   --      Estimated Creatinine Clearance: 16.1 mL/min (A) (by C-G formula based on SCr of 4.36 mg/dL (H)).    Allergies  Allergen Reactions   Ibuprofen Other (See Comments)    Pt is unable to take this due to kidney problems.      Penicillins Rash and Other (See Comments)    Has patient had a PCN reaction causing immediate rash, facial/tongue/throat swelling, SOB or lightheadedness with hypotension: No Has patient had a PCN reaction causing severe rash involving mucus membranes or skin necrosis: No Has patient had a PCN reaction that required hospitalization No Has patient had a PCN reaction occurring within the last 10 years: No If all of the above answers are "NO", then may proceed with Cephalosporin use.    Antimicrobials this admission: vancomycin 6/26 >> 6/28 cefepime 6/26 >> 6/29 Linezolid 6/28>> Fluconazole 6/29 >> Flagyl 6/26 x 1  dose in ED  Microbiology results: 6/26 BCx: ngtd 6/26 7/26, 100K CFU/ml COVID + (negative 3 weeks ago)    Thank you for allowing pharmacy to be a part of this patient's care.  7/26, BS Pharm D, BCPS Clinical Pharmacist 04/20/2022 2:03 PM

## 2022-04-20 NOTE — Progress Notes (Signed)
EEG complete - results pending 

## 2022-04-21 ENCOUNTER — Inpatient Hospital Stay (HOSPITAL_COMMUNITY): Payer: BC Managed Care – PPO

## 2022-04-21 DIAGNOSIS — U071 COVID-19: Secondary | ICD-10-CM | POA: Diagnosis not present

## 2022-04-21 DIAGNOSIS — N179 Acute kidney failure, unspecified: Secondary | ICD-10-CM | POA: Diagnosis not present

## 2022-04-21 DIAGNOSIS — G9341 Metabolic encephalopathy: Secondary | ICD-10-CM | POA: Diagnosis not present

## 2022-04-21 DIAGNOSIS — A419 Sepsis, unspecified organism: Secondary | ICD-10-CM | POA: Diagnosis not present

## 2022-04-21 LAB — RENAL FUNCTION PANEL
Albumin: 2 g/dL — ABNORMAL LOW (ref 3.5–5.0)
Anion gap: 11 (ref 5–15)
BUN: 61 mg/dL — ABNORMAL HIGH (ref 6–20)
CO2: 22 mmol/L (ref 22–32)
Calcium: 6.5 mg/dL — ABNORMAL LOW (ref 8.9–10.3)
Chloride: 93 mmol/L — ABNORMAL LOW (ref 98–111)
Creatinine, Ser: 5.38 mg/dL — ABNORMAL HIGH (ref 0.44–1.00)
GFR, Estimated: 9 mL/min — ABNORMAL LOW (ref >60)
Glucose, Bld: 143 mg/dL — ABNORMAL HIGH (ref 70–99)
Phosphorus: 6.2 mg/dL — ABNORMAL HIGH (ref 2.5–4.6)
Potassium: 5.1 mmol/L (ref 3.5–5.1)
Sodium: 126 mmol/L — ABNORMAL LOW (ref 135–145)

## 2022-04-21 LAB — GLUCOSE, CAPILLARY
Glucose-Capillary: 131 mg/dL — ABNORMAL HIGH (ref 70–99)
Glucose-Capillary: 413 mg/dL — ABNORMAL HIGH (ref 70–99)
Glucose-Capillary: 100 mg/dL — ABNORMAL HIGH (ref 70–99)
Glucose-Capillary: 105 mg/dL — ABNORMAL HIGH (ref 70–99)
Glucose-Capillary: 115 mg/dL — ABNORMAL HIGH (ref 70–99)
Glucose-Capillary: 145 mg/dL — ABNORMAL HIGH (ref 70–99)

## 2022-04-21 LAB — CREATININE, SERUM
Creatinine, Ser: 5.36 mg/dL — ABNORMAL HIGH (ref 0.44–1.00)
GFR, Estimated: 9 mL/min — ABNORMAL LOW (ref >60)

## 2022-04-21 LAB — CK: Total CK: 6333 U/L — ABNORMAL HIGH (ref 38–234)

## 2022-04-21 LAB — GLUCOSE, RANDOM: Glucose, Bld: 108 mg/dL — ABNORMAL HIGH (ref 70–99)

## 2022-04-21 MED ORDER — FUROSEMIDE 10 MG/ML IJ SOLN
40.0000 mg | Freq: Two times a day (BID) | INTRAMUSCULAR | Status: DC
  Administered 2022-04-21 – 2022-04-22 (×3): 40 mg via INTRAVENOUS
  Filled 2022-04-21 (×3): qty 4

## 2022-04-21 MED ORDER — SODIUM CHLORIDE 0.9 % IV SOLN
500.0000 mg | Freq: Two times a day (BID) | INTRAVENOUS | Status: DC
  Administered 2022-04-21 – 2022-04-22 (×2): 500 mg via INTRAVENOUS
  Filled 2022-04-21 (×4): qty 10

## 2022-04-21 MED ORDER — INSULIN ASPART 100 UNIT/ML IJ SOLN
0.0000 [IU] | Freq: Three times a day (TID) | INTRAMUSCULAR | Status: DC
  Administered 2022-04-21: 1 [IU] via SUBCUTANEOUS
  Administered 2022-04-22 (×2): 2 [IU] via SUBCUTANEOUS
  Administered 2022-04-24: 1 [IU] via SUBCUTANEOUS
  Administered 2022-04-27 (×3): 2 [IU] via SUBCUTANEOUS
  Administered 2022-04-28 (×2): 1 [IU] via SUBCUTANEOUS
  Administered 2022-04-29: 2 [IU] via SUBCUTANEOUS
  Administered 2022-04-29 – 2022-05-01 (×5): 1 [IU] via SUBCUTANEOUS
  Administered 2022-05-01: 2 [IU] via SUBCUTANEOUS
  Administered 2022-05-02: 3 [IU] via SUBCUTANEOUS
  Administered 2022-05-03: 2 [IU] via SUBCUTANEOUS
  Administered 2022-05-04: 1 [IU] via SUBCUTANEOUS
  Administered 2022-05-04: 2 [IU] via SUBCUTANEOUS
  Administered 2022-05-05 – 2022-05-06 (×3): 1 [IU] via SUBCUTANEOUS
  Administered 2022-05-07: 3 [IU] via SUBCUTANEOUS
  Administered 2022-05-07: 5 [IU] via SUBCUTANEOUS
  Administered 2022-05-07: 3 [IU] via SUBCUTANEOUS
  Administered 2022-05-08: 2 [IU] via SUBCUTANEOUS
  Administered 2022-05-08: 3 [IU] via SUBCUTANEOUS
  Administered 2022-05-08: 2 [IU] via SUBCUTANEOUS
  Administered 2022-05-09 (×3): 3 [IU] via SUBCUTANEOUS
  Administered 2022-05-10 (×3): 1 [IU] via SUBCUTANEOUS
  Administered 2022-05-11: 5 [IU] via SUBCUTANEOUS
  Administered 2022-05-11 – 2022-05-12 (×3): 3 [IU] via SUBCUTANEOUS
  Administered 2022-05-13: 1 [IU] via SUBCUTANEOUS
  Administered 2022-05-13: 7 [IU] via SUBCUTANEOUS
  Administered 2022-05-14: 3 [IU] via SUBCUTANEOUS
  Administered 2022-05-14: 1 [IU] via SUBCUTANEOUS
  Administered 2022-05-14: 3 [IU] via SUBCUTANEOUS
  Administered 2022-05-15: 2 [IU] via SUBCUTANEOUS
  Administered 2022-05-15: 7 [IU] via SUBCUTANEOUS
  Administered 2022-05-15: 2 [IU] via SUBCUTANEOUS
  Administered 2022-05-16: 3 [IU] via SUBCUTANEOUS

## 2022-04-21 MED ORDER — INSULIN GLARGINE-YFGN 100 UNIT/ML ~~LOC~~ SOLN
4.0000 [IU] | Freq: Every day | SUBCUTANEOUS | Status: DC
  Administered 2022-04-21 – 2022-04-29 (×9): 4 [IU] via SUBCUTANEOUS
  Filled 2022-04-21 (×10): qty 0.04

## 2022-04-21 NOTE — Progress Notes (Signed)
         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  Symptoms have been present for the last 2-3 days and worsening according to patient's husband.  Patient has just completed her antibiotic therapy from most recent admission about 1 week prior to presentation.  The patient was recently admitted to the hospital from 03/26/2022 to 04/05/2022 when she was treated for pneumonia.  She also had an acute pulmonary embolus for which she was treated with apixaban.  She had instructions to continue vancomycin until 04/08/2022.  The patient was followed by infectious disease.  She had a bronchoscopy on 03/30/2022.  Tissue cultures grew Candida, but her clinical presentation was not consistent and the patient was improving without antifungal therapy.  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  This was felt to be also a contaminant. Per patient's husband he went to work and when he contacted patient she did not pick up the phone; on his arrival to home he found patient on the floor confused and unable to stand up.  Unclear if she tripped and fell, fell off her feet from ongoing weakness and deconditioning or ended passing out. In the ED patient met criteria for severe sepsis with elevated WBCs, elevated lactic acid in the 4.7 range, acute renal failure as organ dysfunction and was also tachycardic and tachypneic  The source of patient's sepsis was attributable to pneumonia and UTI.   The patient was initially started on vancomycin and cefepime and IV  fluids.  Her sepsis physiology did improve.  Her PCT levels decreased.  Unfortunately, the patient had progressive renal failure that was multifactorial including but not limited to her sepsis and rhabdomyolysis.  Nephrology was consulted.  They continue to follow the patient. Unfortunately, the patient's mental status has declined over the last 48 hours.  This is also likely multifactorial including her renal failure, medications, and other metabolic/electrolyte derangements.  EEG was obtained and did not show any seizure even in the presence of the patient's arm and leg twitching.  The case was discussed with neurology, Dr. Yadav.  The patient spouse was concerned and wanted inpatient neurology evaluation for which the patient is being transferred to [place].  Neurology was notified.  Nephrology was also notified of the patient's transfer.     Assessment and Plan: * Severe sepsis (HCC) - Patient met sepsis criteria at time of admission with elevated WBCs, tachycardia and low-grade temperature.  Organ dysfunction demonstrated by acute renal failure and a lactic acid of 4.7. -Continue broad-spectrum antibiotic and narrow as appropriate based on culture results. -Source of infection presumed to be either her lungs or UTI. -Continue aggressive fluid resuscitation -blood cultures neg -sepsis physiology resolved  Acute cystitis without hematuria -Broad-spectrum antibiotic has been initiated -Follow culture results and narrow coverage as needed -Continue aggressive fluid resuscitation. -urine culture with yeast, now on fluconazole D#2  Secondary adrenal insufficiency (HCC) -With mild crisis most likely in the setting of acute infection -Continue higher dosages of prednisone to provide stress dose steroids.  Chronic pain -Patient on Neurontin and Nucynta at baseline>>stopped due to AMS -Home pain medication has also   been adjusted. -Minimize extra sedative and analgesics agents.  Current  chronic use of systemic steroids -Usually 5 mg of prednisone on daily basis -Solu-Cortef x1 given while in the ED at time of admission. -Continue higher dose of prednisone 20 mg bid  AKI (acute kidney injury) (HCC) - Acute renal failure with a creatinine of 1.73 at time of admission. -Appears to be in the setting of dehydration, sepsis physiology, and rhabdomyolysis. -Renal ultrasound demonstrating no obstructive uropathy -Continue aggressive fluid resuscitation and minimize nephrotoxic agents. -had normal renal function prior to admission -nephrology following>>I notified them of the transfer to [facility]  Granulomatosis with polyangiitis (HCC) -Patient receiving rituximab and methotrexate as an outpatient -Recently has no follow-up with rheumatology service and looking to be seen at [facility]. -Continue steroids for now. -Case discussed with Dr. Alva (PCCM) recommend continued treatment so far provided with outpatient follow-up as already arranged with rheumatology service at [facility].   GERD (gastroesophageal reflux disease) -Continue PPI.  Essential hypertension -Continue holding home antihypertensive agents in the setting of acute sepsis and worsening renal function -Blood pressure stable at this point. -Heart healthy diet ordered.  Rhabdomyolysis - Down on ground for multiple hours prior to be found -Very likely some muscular trauma with fall; images not demonstrating fractures. -initial CK 26433>>6333 -Continue aggressive fluid resuscitation and follow CK level.  Transaminitis -Most likely in the setting of sepsis/shock liver and rhabdomyolysis. -Continue aggressive fluid resuscitation and follow LFTs. -Avoid hepatotoxic agents. LFTs trending down  Pulmonary embolism (HCC) -On chronic Eliquis -diagnosed on CTA 03/27/22 (last admission) -Continue anticoagulation.  Acute metabolic encephalopathy -In the setting of acute infection; renal failure, hyponatremia -Continue constant  reorientation -mental status worsening in past 48 hours -at baseline A&O x 4, uses cane/walker to ambulate -d/ced gaba, cefepime 6/29 -d/c nucynta -discussed with neurology, Dr. Yadav>>ok to transfer for neuro consult -6/29 EEG--NO seizure activity during arm/leg jerking   COVID-19 virus infection - COVID PCR positive at time of admission -Inflammatory markers suggesting bacterial infection and very likely component of underlying rheumatologic condition. -PCT 4.45>>4.17>>2.95 -Continue stress dose steroids -No remdesivir or antiviral in the setting of acute liver dysfunction -has remained stable on RA the entire hospitalization  Obesity (BMI 30-39.9) -Class I obesity -Body mass index is 33.38 kg/m. -Low calorie diet and portion control discussed patient.    Opioid dependence (HCC) Patient has intrathecal pump with bupivacaine and ziconotide,  --Last refill 03/02/2022 PDMP reviewed -- Patient receives Nucynta 75 mg, #120; last refill 03/17/2022 -- Patient is also on oxycodone 10 mg; #30, last refilled 02/23/2022  Chemotherapy-induced peripheral neuropathy (HCC) At home--is on gabapentin, Cymbalta, amitriptyline topiramate, trazodone, nucynta--all have been held         Family Communication:   spouse updated 6/30  Consultants:  renal, neurology  Code Status:  FULL   DVT Prophylaxis:  apixaban   Procedures: As Listed in Progress Note Above  Antibiotics: Vanco 6/26>>6/28 Zyvox 6/28>> Cefepime 6/26>>6/29 Merrem 6/29>>     Subjective: Patient is awake and alert, but is only intermittently following commands and answering questions.  Denies any chest pain or shortness of breath.  Remainder review of systems unobtainable.  Objective: Vitals:   04/21/22 0510 04/21/22 0825 04/21/22 1420 04/21/22 1538  BP: (!) 149/98  (!) 147/101 (!) 144/94  Pulse: 72  76 79  Resp: 17  18 (!) 23  Temp: 98.3 F (36.8 C)  98.8 F (37.1 C) 98.4 F (36.9 C)  TempSrc: Oral    Oral  SpO2: 93%   96% 95% 94%  Weight:      Height:        Intake/Output Summary (Last 24 hours) at 04/21/2022 1622 Last data filed at 04/21/2022 0900 Gross per 24 hour  Intake 1241.19 ml  Output 601 ml  Net 640.19 ml   Weight change:  Exam:  General:  Pt is alert, intermittently follows commands appropriately, not in acute distress HEENT: No icterus, No thrush, No neck mass, Christian/AT Cardiovascular: RRR, S1/S2, no rubs, no gallops Respiratory: Bibasilar rales.  No wheezing. Abdomen: Soft/+BS, non tender, non distended, no guarding Extremities: Non pitting edema, No lymphangitis, No petechiae, No rashes, no synovitis   Data Reviewed: I have personally reviewed following labs and imaging studies Basic Metabolic Panel: Recent Labs  Lab 04/17/22 1124 04/18/22 0326 04/19/22 0333 04/20/22 0311 04/21/22 0522 04/21/22 1242  NA 136 134* 129* 127*  128* 126*  --   K 4.7 4.8 5.4* 4.9  4.9 5.1  --   CL 106 106 102 94*  96* 93*  --   CO2 17* 18* 15* 23  22 22  --   GLUCOSE 253* 252* 187* 156*  155* 143* 108*  BUN 14 27* 43* 51*  52* 61*  --   CREATININE 1.73* 2.24* 3.68*  3.57* 4.37*  4.36* 5.36*  5.38*  --   CALCIUM 8.7* 7.8* 7.1* 6.6*  6.7* 6.5*  --   PHOS  --   --  4.7* 5.4* 6.2*  --    Liver Function Tests: Recent Labs  Lab 04/17/22 1124 04/18/22 0326 04/19/22 0333 04/20/22 0311 04/21/22 0522  AST 202* 933*  --  438*  --   ALT 54* 202*  --  203*  --   ALKPHOS 103 76  --  80  --   BILITOT 0.7 0.5  --  0.6  --   PROT 5.9* 4.7*  --  4.5*  --   ALBUMIN 2.9* 2.3* 2.0* 1.9*  1.9* 2.0*   No results for input(s): "LIPASE", "AMYLASE" in the last 168 hours. Recent Labs  Lab 04/20/22 1411  AMMONIA 14   Coagulation Profile: Recent Labs  Lab 04/17/22 1124  INR 1.8*   CBC: Recent Labs  Lab 04/17/22 1124 04/18/22 0326 04/20/22 0311  WBC 24.5* 19.1* 12.4*  NEUTROABS 18.4*  --  10.5*  HGB 11.2* 10.1* 8.2*  HCT 38.1 34.3* 25.6*  MCV 90.5 88.9 82.8  PLT 286  271 245   Cardiac Enzymes: Recent Labs  Lab 04/18/22 1300 04/20/22 0311 04/21/22 0522  CKTOTAL 26,433* 13,704* 6,333*   BNP: Invalid input(s): "POCBNP" CBG: Recent Labs  Lab 04/21/22 0727 04/21/22 1224 04/21/22 1238 04/21/22 1248 04/21/22 1250  GLUCAP 131* 413* 100* 115* 105*   HbA1C: No results for input(s): "HGBA1C" in the last 72 hours. Urine analysis:    Component Value Date/Time   COLORURINE AMBER (A) 04/17/2022 1115   APPEARANCEUR CLOUDY (A) 04/17/2022 1115   LABSPEC 1.019 04/17/2022 1115   PHURINE 5.0 04/17/2022 1115   GLUCOSEU 50 (A) 04/17/2022 1115   HGBUR LARGE (A) 04/17/2022 1115   BILIRUBINUR NEGATIVE 04/17/2022 1115   KETONESUR NEGATIVE 04/17/2022 1115   PROTEINUR 100 (A) 04/17/2022 1115   UROBILINOGEN 0.2 05/30/2013 1112   NITRITE NEGATIVE 04/17/2022 1115   LEUKOCYTESUR LARGE (A) 04/17/2022 1115   Sepsis Labs: @LABRCNTIP(procalcitonin:4,lacticidven:4) ) Recent Results (from the past 240 hour(s))  Culture, blood (Routine x 2)     Status: None (Preliminary result)   Collection Time: 04/17/22 11:25 AM     Specimen: Left Antecubital; Blood  Result Value Ref Range Status   Specimen Description LEFT ANTECUBITAL  Final   Special Requests   Final    BOTTLES DRAWN AEROBIC AND ANAEROBIC Blood Culture results may not be optimal due to an inadequate volume of blood received in culture bottles   Culture   Final    NO GROWTH 4 DAYS Performed at [facility], [address] [place], [place] [address]    Report Status PENDING  Incomplete  Culture, blood (Routine x 2)     Status: None (Preliminary result)   Collection Time: 04/17/22 11:34 AM   Specimen: BLOOD LEFT ARM  Result Value Ref Range Status   Specimen Description BLOOD LEFT ARM  Final   Special Requests   Final    BOTTLES DRAWN AEROBIC AND ANAEROBIC Blood Culture adequate volume   Culture   Final    NO GROWTH 4 DAYS Performed at [facility], [address] [place], [place] [address]    Report  Status PENDING  Incomplete  Urine Culture     Status: Abnormal   Collection Time: 04/17/22 11:53 AM   Specimen: Urine, Catheterized  Result Value Ref Range Status   Specimen Description   Final    URINE, CATHETERIZED Performed at [facility], [address] [place], [place] [address]    Special Requests   Final    NONE Performed at [facility], [address] [place], [place] [address]    Culture >=100,000 COLONIES/mL YEAST (A)  Final   Report Status 04/19/2022 FINAL  Final  Resp Panel by RT-PCR (Flu A&B, Covid) Anterior Nasal Swab     Status: Abnormal   Collection Time: 04/17/22 11:57 AM   Specimen: Anterior Nasal Swab  Result Value Ref Range Status   SARS Coronavirus 2 by RT PCR POSITIVE (A) NEGATIVE Final    Comment: (NOTE) SARS-CoV-2 target nucleic acids are DETECTED.  The SARS-CoV-2 RNA is generally detectable in upper respiratory specimens during the acute phase of infection. Positive results are indicative of the presence of the identified virus, but do not rule out bacterial infection or co-infection with other pathogens not detected by the test. Clinical correlation with patient history and other diagnostic information is necessary to determine patient infection status. The expected result is Negative.  Fact Sheet for Patients: https://www.fda.gov/media/152166/download  Fact Sheet for Healthcare Providers: https://www.fda.gov/media/152162/download  This test is not yet approved or cleared by the [place] FDA and  has been authorized for detection and/or diagnosis of SARS-CoV-2 by FDA under an Emergency Use Authorization (EUA).  This EUA will remain in effect (meaning this test can be used) for the duration of  the COVID-19 declaration under Section 564(b)(1) of the A ct, 21 U.S.C. section 360bbb-3(b)(1), unless the authorization is terminated or revoked sooner.     Influenza A by PCR NEGATIVE NEGATIVE Final   Influenza B by PCR NEGATIVE NEGATIVE Final     Comment: (NOTE) The Xpert Xpress SARS-CoV-2/FLU/RSV plus assay is intended as an aid in the diagnosis of influenza from Nasopharyngeal swab specimens and should not be used as a sole basis for treatment. Nasal washings and aspirates are unacceptable for Xpert Xpress SARS-CoV-2/FLU/RSV testing.  Fact Sheet for Patients: https://www.fda.gov/media/152166/download  Fact Sheet for Healthcare Providers: https://www.fda.gov/media/152162/download  This test is not yet approved or cleared by the [place] FDA and has been authorized for detection and/or diagnosis of SARS-CoV-2 by FDA under an Emergency Use Authorization (EUA). This EUA will remain in effect (meaning this   test can be used) for the duration of the COVID-19 declaration under Section 564(b)(1) of the Act, 21 U.S.C. section 360bbb-3(b)(1), unless the authorization is terminated or revoked.  Performed at [facility], [address], [place], [place] [address]      Scheduled Meds:  apixaban  5 mg Oral BID   Chlorhexidine Gluconate Cloth  6 each Topical Daily   DULoxetine  60 mg Oral BID   fluconazole  100 mg Oral Daily   folic acid  2 mg Oral Daily   furosemide  40 mg Intravenous Q12H   guaiFENesin-dextromethorphan  10 mL Oral Q12H   insulin aspart  0-9 Units Subcutaneous TID WC   insulin glargine-yfgn  4 Units Subcutaneous QHS   ipratropium-albuterol  3 mL Nebulization BID   linaclotide  145 mcg Oral QAC breakfast   pantoprazole  40 mg Oral Daily   predniSONE  20 mg Oral BID WC   senna  2 tablet Oral BID   tapentadol  50 mg Oral Q12H   vitamin B-12  1,000 mcg Oral Daily   Continuous Infusions:  linezolid (ZYVOX) IV 600 mg (04/21/22 1250)   meropenem (MERREM) IV      Procedures/Studies: EEG adult  Result Date: 05-17-2022 Lora Havens, MD     17-May-2022  8:54 PM Patient Name: Monzerrath Mcburney MRN: [id number] Epilepsy Attending: Lora Havens Referring Physician/Provider: Orson Eva, MD Date: 2022-05-17  Duration: 24.13 mins Patient history: 56yo F with ams. EEG to evaluate for seizure. Level of alertness: Awake AEDs during EEG study: None Technical aspects: This EEG study was done with scalp electrodes positioned according to the 10-20 International system of electrode placement. Electrical activity was acquired at a sampling rate of 500Hz and reviewed with a high frequency filter of 70Hz and a low frequency filter of 1Hz. EEG data were recorded continuously and digitally stored. Description: No clear posterior dominant rhythm was seen. EEG showed continuous generalized predominantly 5-7 Hz theta slowing admixed with intermittent generalized 2-3hz delta slowing. Patient was noted to have brief intermittent whole body twitching as well as asynchronous and non- rhythmic twitching of arms and legs. Concomitant EEG before, during and after the event did not show any EEG changes suggest seizure. Hyperventilation and photic stimulation were not performed.   ABNORMALITY - Continuous slow, generalized IMPRESSION: This study is suggestive of moderate diffuse encephalopathy, nonspecific etiology. No seizures or epileptiform discharges were seen throughout the recording. Patient was noted to have brief intermittent whole body twitching as well as asynchronous and non- rhythmic twitching of arms and legs without concomitant EEG change. These episodes were NOT epileptic. Priyanka O Yadav   US RENAL  Result Date: 04/18/2022 CLINICAL DATA:  Renal failure EXAM: RENAL / URINARY TRACT ULTRASOUND COMPLETE COMPARISON:  CT 07/09/2016 FINDINGS: Right Kidney: Renal measurements: 10.3 x 6.4 x 6.0 cm = volume: 207 mL. Echogenicity within normal limits. No mass or hydronephrosis visualized. Left Kidney: Renal measurements: 10.9 x 6.2 x 4.7 cm = volume: 166 mL. Echogenicity within normal limits. No mass or hydronephrosis visualized. Bladder: Decompressed by Foley catheter. Other: None. IMPRESSION: No evidence of obstructive uropathy.  Electronically Signed   By: Davina Poke D.O.   On: 04/18/2022 14:05   CT Head Wo Contrast  Result Date: 04/17/2022 CLINICAL DATA:  Mental status changes, increased generalized weakness, incontinence, unknown etiology; history diabetes mellitus, Wegner's granulomatosis, essential hypertension EXAM: CT HEAD WITHOUT CONTRAST CT CERVICAL SPINE WITHOUT CONTRAST TECHNIQUE: Multidetector CT imaging of the head and cervical spine was  performed following the standard protocol without intravenous contrast. Multiplanar CT image reconstructions of the cervical spine were also generated. RADIATION DOSE REDUCTION: This exam was performed according to the departmental dose-optimization program which includes automated exposure control, adjustment of the mA and/or kV according to patient size and/or use of iterative reconstruction technique. COMPARISON:  CT head 03/26/2022 FINDINGS: CT HEAD FINDINGS Brain: Asymmetry of the lateral ventricles, LEFT larger than RIGHT, little changed from previous exam. No midline shift or mass effect. 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 No intracranial hemorrhage, mass lesion, or evidence of acute infarction. No extra-axial fluid collections. Vascular: No hyperdense vessels Skull: Hyperostosis frontalis interna.  Calvaria intact. Sinuses/Orbits: Clear Other: N/A CT CERVICAL SPINE FINDINGS Alignment: Normal Skull base and vertebrae: Osseous mineralization normal. Scattered facet degenerative changes. Mild disc space narrowing and endplate spur formation at C5-C6. No fracture, subluxation, or bone destruction. Visualized skull base intact. Soft tissues and spinal canal: Prevertebral soft tissues normal thickness. Disc levels:  No additional abnormalities Upper chest: Tips of lung apices clear Other: N/A IMPRESSION: No acute intracranial abnormalities. 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 Degenerative disc and facet disease changes of  the cervical spine. No acute cervical spine abnormalities. Electronically Signed   By: Mark  Boles M.D.   On: 04/17/2022 12:41   CT Cervical Spine Wo Contrast  Result Date: 04/17/2022 CLINICAL DATA:  Mental status changes, increased generalized weakness, incontinence, unknown etiology; history diabetes mellitus, Wegner's granulomatosis, essential hypertension EXAM: CT HEAD WITHOUT CONTRAST CT CERVICAL SPINE WITHOUT CONTRAST TECHNIQUE: Multidetector CT imaging of the head and cervical spine was performed following the standard protocol without intravenous contrast. Multiplanar CT image reconstructions of the cervical spine were also generated. RADIATION DOSE REDUCTION: This exam was performed according to the departmental dose-optimization program which includes automated exposure control, adjustment of the mA and/or kV according to patient size and/or use of iterative reconstruction technique. COMPARISON:  CT head 03/26/2022 FINDINGS: CT HEAD FINDINGS Brain: Asymmetry of the lateral ventricles, LEFT larger than RIGHT, little changed from previous exam. No midline shift or mass effect. 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 No intracranial hemorrhage, mass lesion, or evidence of acute infarction. No extra-axial fluid collections. Vascular: No hyperdense vessels Skull: Hyperostosis frontalis interna.  Calvaria intact. Sinuses/Orbits: Clear Other: N/A CT CERVICAL SPINE FINDINGS Alignment: Normal Skull base and vertebrae: Osseous mineralization normal. Scattered facet degenerative changes. Mild disc space narrowing and endplate spur formation at C5-C6. No fracture, subluxation, or bone destruction. Visualized skull base intact. Soft tissues and spinal canal: Prevertebral soft tissues normal thickness. Disc levels:  No additional abnormalities Upper chest: Tips of lung apices clear Other: N/A IMPRESSION: No acute intracranial abnormalities. Old lacunar infarct at anterior limb RIGHT  internal capsule. Degenerative disc and facet disease changes of the cervical spine. No acute cervical spine abnormalities. Electronically Signed   By: Mark  Boles M.D.   On: 04/17/2022 12:41   DG Thoracic Spine 2 View  Result Date: 04/17/2022 CLINICAL DATA:  Trauma, fall, pain EXAM: THORACIC SPINE 2 VIEWS COMPARISON:  None Available. FINDINGS: No recent fracture is seen. There is mild dextroscoliosis in the upper thoracic spine. Alignment of posterior margins of vertebral bodies is unremarkable. Degenerative changes are noted with anterior bony spurs at multiple levels. Paraspinal soft tissues are unremarkable. IMPRESSION: No recent fracture is seen in the thoracic spine. Degenerative changes are noted with anterior bony spurs. Electronically Signed   By: Palani  Rathinasamy M.D.     On: 04/17/2022 12:38   DG Chest 1 View  Result Date: 04/17/2022 CLINICAL DATA:  Granulomatosis with polyangiitis. EXAM: CHEST  1 VIEW COMPARISON:  One-view chest x-ray 04/03/2022. FINDINGS: Heart size exaggerated by low lung volumes. Progressive interstitial pattern involves the left lung and right upper lobe. Surgical clips are present in the abdomen. IMPRESSION: Progressive interstitial pattern left lung and right upper lobe. This may represent edema or infection. Electronically Signed   By: San Morelle M.D.   On: 04/17/2022 12:38   DG Lumbar Spine Complete  Result Date: 04/17/2022 CLINICAL DATA:  Trauma, fall EXAM: LUMBAR SPINE - COMPLETE 4+ VIEW COMPARISON:  None Available. FINDINGS: No recent fracture is seen. There is no significant disc space narrowing. There is minimal anterolisthesis at L4-L5 level. Degenerative changes are noted with small anterior bony spurs and facet hypertrophy more so in the lower lumbar spine. There is an electronic device in the subcutaneous plane in the anterior abdominal wall which is partly obscuring the bony structures in the 1 of the views. IMPRESSION: No recent fracture is seen  in the lumbar spine. Lumbar spondylosis, more so in the facet joints in the lower lumbar spine. There is mild anterolisthesis at L4-L5 level. Electronically Signed   By: Elmer Picker M.D.   On: 04/17/2022 12:37   DG Foot 2 Views Right  Result Date: 04/03/2022 CLINICAL DATA:  Right foot ulcer EXAM: RIGHT FOOT - 2 VIEW COMPARISON:  None Available. FINDINGS: No acute fracture or dislocation. Prior transmetatarsal amputation. Small plantar calcaneal spur. Enthesopathic changes of the Achilles tendon insertion. No aggressive osseous lesion. Normal alignment. Soft tissue are unremarkable. No radiopaque foreign body or soft tissue emphysema. IMPRESSION: 1. Prior transmetatarsal amputation. 2. No evidence of osteomyelitis of the right foot. Electronically Signed   By: Kathreen Devoid M.D.   On: 04/03/2022 15:45   DG Chest Port 1 View  Result Date: 04/03/2022 CLINICAL DATA:  Severe sepsis, shortness of breath EXAM: PORTABLE CHEST 1 VIEW COMPARISON:  Radiograph 03/30/2022 FINDINGS: Note that the exam is mislabeled right versus left. There is a right upper extremity PICC with tip overlying the distal superior vena cava. Unchanged cardiomediastinal silhouette. Interval improvement in bilateral airspace opacities with faint residual opacity seen in the right lung. No large effusion. No pneumothorax. No acute osseous abnormality. IMPRESSION: Interval improvement in bilateral airspace disease, with mild residual opacities in the right lung. Electronically Signed   By: Maurine Simmering M.D.   On: 04/03/2022 08:36   [place] EKG SITE RITE  Result Date: 03/31/2022 If Site Rite image not attached, placement could not be confirmed due to current cardiac rhythm.  VAS [place] LOWER EXTREMITY VENOUS (DVT)  Result Date: 03/30/2022  Lower Venous DVT Study Patient Name:  ESSYNCE MUNSCH  Date of Exam:   03/30/2022 Medical Rec #: [id number]    Accession #:    [id number] Date of Birth: 09-Nov-1965    Patient Gender: F Patient Age:   89 years Exam  Location:  [facility] Procedure:      VAS [place] LOWER EXTREMITY VENOUS (DVT) Referring Phys: Deno Etienne [facility] --------------------------------------------------------------------------------  Indications: Swelling, pulmonary embolism, and Elevated D-dimer.  Performing Technologist: Bobetta Lime  Examination Guidelines: A complete evaluation includes B-mode imaging, spectral Doppler, color Doppler, and power Doppler as needed of all accessible portions of each vessel. Bilateral testing is considered an integral part of a complete examination. Limited examinations for reoccurring indications may be performed as noted. The reflux portion of the exam is performed with  the patient in reverse Trendelenburg.  +---------+---------------+---------+-----------+----------+--------------+ RIGHT    CompressibilityPhasicitySpontaneityPropertiesThrombus Aging +---------+---------------+---------+-----------+----------+--------------+ CFV      Full           Yes      Yes                                 +---------+---------------+---------+-----------+----------+--------------+ SFJ      Full                                                        +---------+---------------+---------+-----------+----------+--------------+ FV Prox  Full                                                        +---------+---------------+---------+-----------+----------+--------------+ FV Mid   Full                                                        +---------+---------------+---------+-----------+----------+--------------+ FV DistalFull                                                        +---------+---------------+---------+-----------+----------+--------------+ PFV      Full                                                        +---------+---------------+---------+-----------+----------+--------------+ POP      Full           Yes      Yes                                  +---------+---------------+---------+-----------+----------+--------------+ PTV      Full                                                        +---------+---------------+---------+-----------+----------+--------------+ PERO     Full                                                        +---------+---------------+---------+-----------+----------+--------------+   +---------+---------------+---------+-----------+----------+--------------+ LEFT     CompressibilityPhasicitySpontaneityPropertiesThrombus Aging +---------+---------------+---------+-----------+----------+--------------+ CFV      Full           Yes      Yes                                 +---------+---------------+---------+-----------+----------+--------------+  SFJ      Full                                                        +---------+---------------+---------+-----------+----------+--------------+ FV Prox  Full                                                        +---------+---------------+---------+-----------+----------+--------------+ FV Mid   Full                                                        +---------+---------------+---------+-----------+----------+--------------+ FV DistalFull                                                        +---------+---------------+---------+-----------+----------+--------------+ PFV      Full                                                        +---------+---------------+---------+-----------+----------+--------------+ POP      Full           Yes      Yes                                 +---------+---------------+---------+-----------+----------+--------------+ PTV      Full                                                        +---------+---------------+---------+-----------+----------+--------------+ PERO     Full                                                         +---------+---------------+---------+-----------+----------+--------------+     Summary: RIGHT: - There is no evidence of deep vein thrombosis in the lower extremity.  - No cystic structure found in the popliteal fossa.  LEFT: - There is no evidence of deep vein thrombosis in the lower extremity.  - No cystic structure found in the popliteal fossa.  *See table(s) above for measurements and observations. Electronically signed by Monica Martinez MD on 03/30/2022 at 4:47:59 PM.    Final    DG CHEST PORT 1 VIEW  Result Date: 03/30/2022 CLINICAL DATA:  Status post right lung biopsy EXAM: PORTABLE CHEST 1 VIEW COMPARISON:  03/29/2022 FINDINGS: Unchanged AP portable chest radiograph with  subtle right greater than left upper lobe heterogeneous airspace opacities and probable small, layering bilateral pleural effusions. No new airspace opacity. No pneumothorax. Mild cardiomegaly. The visualized skeletal structures are unremarkable. IMPRESSION: Unchanged AP portable chest radiograph with subtle right greater than left upper lobe heterogeneous airspace opacities and probable small, layering bilateral pleural effusions. No new airspace opacity. No pneumothorax following biopsy. Electronically Signed   By: Delanna Ahmadi M.D.   On: 03/30/2022 13:24   DG ESOPHAGUS W SINGLE CM (SOL OR THIN BA)  Result Date: 03/30/2022 CLINICAL DATA:  Patient complaining of frequent globus sensation, regurgitation, and history of esophageal stricture. EXAM: ESOPHAGUS/BARIUM SWALLOW/TABLET STUDY TECHNIQUE: Single contrast examination was performed using thin liquid barium. This exam was performed by Gareth Eagle, PA-C, and was supervised and interpreted by Dr. Fabiola Backer. FLUOROSCOPY: Radiation Exposure Index (as provided by the fluoroscopic device): 25.8 mGy Kerma COMPARISON:  CT chest dated March 27, 2022. FINDINGS: Study was limited secondary to patient's mobility issues and inability to stand. Swallowing: No aspiration seen. Esophagus:  Smooth focal fixed narrowing of the distal esophagus just superior to the GE junction. No mucosal irregularity or defect. Esophageal motility: Mild dysmotility manifested by intermittent tertiary contractions. Hiatal Hernia: None. Gastroesophageal reflux: None visualized. Ingested 92m barium tablet: Became stuck and did not pass beyond the gastroesophageal junction despite giving water and barium. Other: None. IMPRESSION: 1. Short-segment stricture of the distal esophagus just superior to the GE junction which did not allow passage of a barium tablet. This is likely reflux related, although no reflux was elicited on today's study. 2. Mild esophageal dysmotility. Electronically Signed   By: WTitus DubinM.D.   On: 03/30/2022 09:27   DG Chest Port 1 View  Result Date: 03/29/2022 CLINICAL DATA:  Shortness of breath and cough. EXAM: PORTABLE CHEST 1 VIEW COMPARISON:  03/26/2022 FINDINGS: Stable cardiomediastinal contours. Lungs are suboptimally inflated. No signs of pleural effusion or edema. Persistent asymmetric airspace opacification within the right upper lobe. IMPRESSION: Persistent asymmetric airspace opacification within the right upper lobe compatible with pneumonia. Electronically Signed   By: TKerby MoorsM.D.   On: 03/29/2022 06:43   ECHOCARDIOGRAM COMPLETE  Result Date: 03/28/2022    ECHOCARDIOGRAM REPORT   Patient Name:   Alliene Pekala Date of Exam: 03/28/2022 Medical Rec #:  [id number]  Height:       64.0 in Accession #:    [id number] Weight:       211.4 lb Date of Birth:  206-28-1967  BSA:          2.003 m Patient Age:    89years    BP:           121/84 mmHg Patient Gender: F           HR:           86 bpm. [facility]  [facility] Echo, Cardiac Doppler and Color Doppler Indications:    Pulmonary embolus  History:        Patient has no prior history of Echocardiogram examinations.                 Risk Factors:Hypertension and Dyslipidemia.  Sonographer:    DWenda Low Referring Phys: [id number] Sameul Tagle IMPRESSIONS  1. Left ventricular ejection fraction, by estimation, is 55%. The left ventricle has normal function. The left ventricle has no regional wall motion abnormalities. Left ventricular diastolic parameters were normal.  2. Right ventricular systolic function is normal.  The right ventricular size is normal. There is normal pulmonary artery systolic pressure.  3. The pericardial effusion is anterior to the right ventricle.  4. The mitral valve is abnormal. Trivial mitral valve regurgitation. No evidence of mitral stenosis.  5. The aortic valve is tricuspid. There is mild calcification of the aortic valve. Aortic valve regurgitation is not visualized. Aortic valve sclerosis is present, with no evidence of aortic valve stenosis.  6. The inferior vena cava is normal in size with greater than 50% respiratory variability, suggesting right atrial pressure of 3 mmHg. FINDINGS  Left Ventricle: Left ventricular ejection fraction, by estimation, is 55%. The left ventricle has normal function. The left ventricle has no regional wall motion abnormalities. The left ventricular internal cavity size was normal in size. There is no left ventricular hypertrophy. Left ventricular diastolic parameters were normal. Right Ventricle: The right ventricular size is normal. No increase in right ventricular wall thickness. Right ventricular systolic function is normal. There is normal pulmonary artery systolic pressure. The tricuspid regurgitant velocity is 1.76 m/s, and  with an assumed right atrial pressure of 3 mmHg, the estimated right ventricular systolic pressure is 77.8 mmHg. Left Atrium: Left atrial size was normal in size. Right Atrium: Right atrial size was normal in size. Pericardium: Trivial pericardial effusion is present. The pericardial effusion is anterior to the right ventricle. Mitral Valve: The mitral valve is abnormal. There is mild thickening of the mitral valve leaflet(s). There is  mild calcification of the mitral valve leaflet(s). Trivial mitral valve regurgitation. No evidence of mitral valve stenosis. MV peak gradient, 6.5 mmHg. The mean mitral valve gradient is 3.0 mmHg. Tricuspid Valve: The tricuspid valve is normal in structure. Tricuspid valve regurgitation is trivial. No evidence of tricuspid stenosis. Aortic Valve: The aortic valve is tricuspid. There is mild calcification of the aortic valve. Aortic valve regurgitation is not visualized. Aortic valve sclerosis is present, with no evidence of aortic valve stenosis. Aortic valve mean gradient measures 5.0 mmHg. Aortic valve peak gradient measures 10.8 mmHg. Aortic valve area, by VTI measures 2.22 cm. Pulmonic Valve: The pulmonic valve was normal in structure. Pulmonic valve regurgitation is not visualized. No evidence of pulmonic stenosis. Aorta: The aortic root is normal in size and structure. Venous: The inferior vena cava is normal in size with greater than 50% respiratory variability, suggesting right atrial pressure of 3 mmHg. IAS/Shunts: No atrial level shunt detected by color flow Doppler.  LEFT VENTRICLE PLAX 2D LVIDd:         4.90 cm     Diastology LVIDs:         3.50 cm     LV e' medial:    7.72 cm/s LV PW:         1.00 cm     LV E/e' medial:  15.2 LV IVS:        0.90 cm     LV e' lateral:   14.60 cm/s LVOT diam:     2.00 cm     LV E/e' lateral: 8.0 LV SV:         76 LV SV Index:   38 LVOT Area:     3.14 cm  LV Volumes (MOD) LV vol d, MOD A2C: 60.2 ml LV vol d, MOD A4C: 49.4 ml LV vol s, MOD A2C: 24.4 ml LV vol s, MOD A4C: 23.8 ml LV SV MOD A2C:     35.8 ml LV SV MOD A4C:     49.4 ml LV SV  MOD BP:      30.8 ml RIGHT VENTRICLE RV Basal diam:  3.15 cm RV Mid diam:    2.80 cm RV S prime:     13.40 cm/s TAPSE (M-mode): 2.3 cm LEFT ATRIUM             Index        RIGHT ATRIUM           Index LA diam:        3.70 cm 1.85 cm/m   RA Area:     15.50 cm LA Vol (A2C):   46.0 ml 22.96 ml/m  RA Volume:   37.70 ml  18.82 ml/m LA Vol  (A4C):   50.9 ml 25.41 ml/m LA Biplane Vol: 50.3 ml 25.11 ml/m  AORTIC VALVE                     PULMONIC VALVE AV Area (Vmax):    2.38 cm      PV Vmax:       0.90 m/s AV Area (Vmean):   2.30 cm      PV Peak grad:  3.2 mmHg AV Area (VTI):     2.22 cm AV Vmax:           164.00 cm/s AV Vmean:          107.000 cm/s AV VTI:            0.344 m AV Peak Grad:      10.8 mmHg AV Mean Grad:      5.0 mmHg LVOT Vmax:         124.00 cm/s LVOT Vmean:        78.500 cm/s LVOT VTI:          0.243 m LVOT/AV VTI ratio: 0.71  AORTA Ao Root diam: 2.90 cm MITRAL VALVE                TRICUSPID VALVE MV Area (PHT): 3.68 cm     TR Peak grad:   12.4 mmHg MV Area VTI:   2.13 cm     TR Vmax:        176.00 cm/s MV Peak grad:  6.5 mmHg MV Mean grad:  3.0 mmHg     SHUNTS MV Vmax:       1.27 m/s     Systemic VTI:  0.24 m MV Vmean:      77.6 cm/s    Systemic Diam: 2.00 cm MV Decel Time: 206 msec MV E velocity: 117.00 cm/s MV A velocity: 89.50 cm/s MV E/A ratio:  1.31 Peter Nishan MD Electronically signed by Peter Nishan MD Signature Date/Time: 03/28/2022/12:20:15 PM    Final    CT Angio Chest Pulmonary Embolism (PE) W or WO Contrast  Result Date: 03/27/2022 CLINICAL DATA:  Chest pain and shortness of breath for several weeks, recent pneumonia EXAM: CT ANGIOGRAPHY CHEST WITH CONTRAST TECHNIQUE: Multidetector CT imaging of the chest was performed using the standard protocol during bolus administration of intravenous contrast. Multiplanar CT image reconstructions and MIPs were obtained to evaluate the vascular anatomy. RADIATION DOSE REDUCTION: This exam was performed according to the departmental dose-optimization program which includes automated exposure control, adjustment of the mA and/or kV according to patient size and/or use of iterative reconstruction technique. CONTRAST:  75mL OMNIPAQUE IOHEXOL 300 MG/ML  SOLN IV COMPARISON:  CT chest 01/04/2022 FINDINGS: Cardiovascular: Aorta normal caliber without aneurysm or dissection. Proximal  great vessels unremarkable. Heart normal appearance. No pericardial effusion.   Pulmonary arteries adequately opacified. Two small filling defects are identified consistent with pulmonary embolism, in RIGHT lower lobe image 214 and in RIGHT middle lobe image 160. No additional emboli. Mediastinum/Nodes: Esophagus unremarkable. Few normal sized mediastinal lymph nodes. No thoracic adenopathy. Base of cervical region normal appearance. Lungs/Pleura: Small BILATERAL pleural effusions. BILATERAL pulmonary infiltrates, greatest in RIGHT upper lobe. No pneumothorax or mass. Upper Abdomen: Gallbladder surgically absent. Splenic deformity question related to prior infarct or surgery. Musculoskeletal: No acute osseous findings. Review of the MIP images confirms the above findings. IMPRESSION: Small pulmonary emboli in RIGHT lung. Small BILATERAL pleural effusions. Patchy BILATERAL pulmonary infiltrates consistent with multifocal pneumonia greatest in RIGHT upper lobe. Critical Value/emergent results were called by telephone at the time of interpretation on 03/27/2022 at 1510 hrs to provider Kareen Hitsman MD, who verbally acknowledged these results. Electronically Signed   By: Lavonia Dana M.D.   On: 03/27/2022 15:13   CT Head Wo Contrast  Result Date: 03/26/2022 CLINICAL DATA:  Altered mental status.  Recent stroke. EXAM: CT HEAD WITHOUT CONTRAST TECHNIQUE: Contiguous axial images were obtained from the base of the skull through the vertex without intravenous contrast. RADIATION DOSE REDUCTION: This exam was performed according to the departmental dose-optimization program which includes automated exposure control, adjustment of the mA and/or kV according to patient size and/or use of iterative reconstruction technique. COMPARISON:  11/01/2020 FINDINGS: Brain: No evidence of intracranial hemorrhage, acute infarction, hydrocephalus, extra-axial collection, or mass lesion/mass effect. 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 Vascular:  No hyperdense vessel or other acute findings. Skull: No evidence of fracture or other significant bone abnormality. Sinuses/Orbits:  No acute findings. Other: None. IMPRESSION: No acute intracranial abnormality. Electronically Signed   By: Marlaine Hind M.D.   On: 03/26/2022 11:42   DG Chest Port 1 View  Result Date: 03/26/2022 CLINICAL DATA:  Sepsis. EXAM: PORTABLE CHEST 1 VIEW COMPARISON:  February 19, 2022 FINDINGS: There is infiltrate in the right mid lung. There may be infiltrate in the medial left upper lung. No pneumothorax. No other acute interval changes. The study is limited however due to portable technique. IMPRESSION: The study is limited due to low volume portable technique. However, there is an infiltrate in the right mid lung consistent with for pneumonia given history. There may be a more subtle smaller infiltrate in the medial left upper lung. Electronically Signed   By: Dorise Bullion III M.D.   On: 03/26/2022 10:22    Orson Eva, DO  [facility]  If 7PM-7AM, please contact night-coverage [email or web address] Password [email or web address] 04/21/2022, 4:22 PM   LOS: 4 days

## 2022-04-21 NOTE — Progress Notes (Addendum)
Subjective:  UOP seems to be picking up-  600 recorded yest but looks like missed a shift, now with foley   CK trending down -  acidosis improved - still on room air -  BP up -  - she is alert but anxious today, same speech pattern of maybe some aphasia- neuro has been called EEG= diffuse slowing-    Objective Vital signs in last 24 hours: Vitals:   04/20/22 1943 04/20/22 1953 04/20/22 2224 04/21/22 0510  BP:  119/80 134/84 (!) 149/98  Pulse:  84 81 72  Resp:  (!) 22 (!) 22 17  Temp:  97.7 F (36.5 C) 97.7 F (36.5 C) 98.3 F (36.8 C)  TempSrc:  Oral Oral Oral  SpO2: 95% 100% 98% 93%  Weight:      Height:       Weight change:   Intake/Output Summary (Last 24 hours) at 04/21/2022 0742 Last data filed at 04/21/2022 0737 Gross per 24 hour  Intake 1321.19 ml  Output 601 ml  Net 720.19 ml    Assessment/Plan: 56 year old WF with many medical issues-  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  She now presents with a leukocytosis and probable UTI-  but also complicated by what appears to be rhabdo and AKI-  I suspect she is not that mobile at home and has led to this picture.  Treatment of UTI is underway-  given elevated CK and low bicarb and high K hydrated aggressively initially but then backed off-  follow CK 9 improving) and renal function closely-  hopefully trying to peak -  no current indications for RRT.  DO NOT think this is vasculitis involvement of kidneys.   Tank seems full and BP up so will stop aggressive IVF-  still non oliguric but renal function has not turned around quite yet 2. Hypertension/volume  - initially low BP-  better now-  on prednisone-  on room air so hydrated aggressively to treat rhabdo but  stopped yesterday.   Gave dose of lasix-  will repeat today  3. ID-   presumed UTI-  on broad spectrum abx given IS state including antifungal-   4. Anemia  - present and worsening in the setting of hydration-  follow clinically for now 5. Hyponatremia-  think now is consistent with hypervolemic hyponatremia-  will repeat loop diuretic  6. MS change-  I dont know her baseline-  HCT negative-  now neuro involved-  BUN of 60 should not do this or sodium of 126  Patient will not be physically seen over the weekend.  We will be looking at lab numbers and making appropriate adjustments.  Call with any questions     Cecille Aver    Labs: Basic Metabolic Panel: Recent Labs  Lab 04/19/22 0333 04/20/22 0311 04/21/22 0522  NA 129* 127*  128* 126*  K 5.4* 4.9  4.9 5.1  CL 102 94*  96* 93*  CO2 15* 23  22 22   GLUCOSE 187* 156*  155* 143*  BUN 43* 51*  52* 61*  CREATININE 3.68*  3.57* 4.37*  4.36* 5.36*  5.38*  CALCIUM 7.1* 6.6*  6.7* 6.5*  PHOS 4.7* 5.4* 6.2*   Liver Function Tests: Recent Labs  Lab  04/17/22 1124 04/18/22 0326 04/19/22 0333 04/20/22 0311 04/21/22 0522  AST 202* 933*  --  438*  --   ALT 54* 202*  --  203*  --   ALKPHOS 103 76  --  80  --   BILITOT 0.7 0.5  --  0.6  --   PROT 5.9* 4.7*  --  4.5*  --   ALBUMIN 2.9* 2.3* 2.0* 1.9*  1.9* 2.0*   No results for input(s): "LIPASE", "AMYLASE" in the last 168 hours. Recent Labs  Lab 04/20/22 1411  AMMONIA 14   CBC: Recent Labs  Lab 04/17/22 1124 04/18/22 0326 04/20/22 0311  WBC 24.5* 19.1* 12.4*  NEUTROABS 18.4*  --  10.5*  HGB 11.2* 10.1* 8.2*  HCT 38.1 34.3* 25.6*  MCV 90.5 88.9 82.8  PLT 286 271 245   Cardiac Enzymes: Recent Labs  Lab 04/18/22 1300 04/20/22 0311 04/21/22 0522  CKTOTAL 26,433* 13,704* 6,333*   CBG: Recent Labs  Lab 04/20/22 0801 04/20/22 1119 04/20/22 1616 04/20/22 2155 04/21/22 0727  GLUCAP 128* 188* 124* 155* 131*    Iron Studies: No results for input(s): "IRON", "TIBC", "TRANSFERRIN", "FERRITIN" in the last 72  hours. Studies/Results: EEG adult  Result Date: 04/20/2022 Charlsie Quest, MD     04/20/2022  8:54 PM Patient Name: [name] MRN: [id number] Epilepsy Attending: Charlsie Quest Referring Physician/Provider: Catarina Hartshorn, MD Date: 04/20/2022 Duration: 24.13 mins Patient history: 56yo F with ams. EEG to evaluate for seizure. Level of alertness: Awake AEDs during EEG study: None Technical aspects: This EEG study was done with scalp electrodes positioned according to the 10-20 International system of electrode placement. Electrical activity was acquired at a sampling rate of 500Hz  and reviewed with a high frequency filter of 70Hz  and a low frequency filter of 1Hz . EEG data were recorded continuously and digitally stored. Description: No clear posterior dominant rhythm was seen. EEG showed continuous generalized predominantly 5-7 Hz theta slowing admixed with intermittent generalized 2-3hz  delta slowing. Patient was noted to have brief intermittent whole body twitching as well as asynchronous and non- rhythmic twitching of arms and legs. Concomitant EEG before, during and after the event did not show any EEG changes suggest seizure. Hyperventilation and photic stimulation were not performed.   ABNORMALITY - Continuous slow, generalized IMPRESSION: This study is suggestive of moderate diffuse encephalopathy, nonspecific etiology. No seizures or epileptiform discharges were seen throughout the recording. Patient was noted to have brief intermittent whole body twitching as well as asynchronous and non- rhythmic twitching of arms and legs without concomitant EEG change. These episodes were NOT epileptic. Priyanka   Medications: Infusions:  linezolid (ZYVOX) IV 10 mL/hr at 04/21/22 0737   meropenem (MERREM) IV Stopped (04/20/22 2237)    Scheduled Medications:  apixaban  5 mg Oral BID   Chlorhexidine Gluconate Cloth  6 each Topical Daily   DULoxetine  60 mg Oral BID   fluconazole  100 mg Oral Daily    folic acid  2 mg Oral Daily   guaiFENesin-dextromethorphan  10 mL Oral Q12H   insulin aspart  0-15 Units Subcutaneous TID WC   insulin glargine-yfgn  10 Units Subcutaneous QHS   ipratropium-albuterol  3 mL Nebulization BID   linaclotide  145 mcg Oral QAC breakfast   pantoprazole  40 mg Oral Daily   predniSONE  20 mg Oral BID WC   senna  2 tablet Oral BID   tapentadol  50 mg Oral Q12H   vitamin B-12  1,000  mcg Oral Daily    have reviewed scheduled and prn medications.  Physical Exam: General: alert, wide eyed-  some repeating of speech and difficulty finding words-  non focal Heart:RRR Lungs: poor effort, dec BS at bases Abdomen: obese, soft non tender Extremities: pitting edema now-  bilat TMA    04/21/2022,7:42 AM  LOS: 4 days

## 2022-04-21 NOTE — Evaluation (Signed)
Clinical/Bedside Swallow Evaluation Patient Details  Name: [name] MRN: [id number] Date of Birth: Sep 02, 1966  Today's Date: 04/21/2022 Time: SLP Start Time (ACUTE ONLY): 69 SLP Stop Time (ACUTE ONLY): 1536 SLP Time Calculation (min) (ACUTE ONLY): 31 min  Past Medical History:  Past Medical History:  Diagnosis Date   Chronic cough    Chronic pain    Diabetes mellitus without complication (HCC)    Dyspnea    GERD (gastroesophageal reflux disease)    Hypokalemia    Hypothyroidism    Left wrist pain 06/01/2021   Myonecrosis ([place]) 07/01/2021   Neurocardiogenic syncope    OSA (obstructive sleep apnea)    does not use CPAP   Osteomyelitis (HCC)    bilateral feet   Peripheral neuropathy    Presence of intrathecal pump    recieves Prialt/bupivicaine   Tear of gluteus medius tendon 07/01/2021   Wegener's granulomatosis 2009   Wegner's disease (congenital syphilitic osteochondritis)    Past Surgical History:  Past Surgical History:  Procedure Laterality Date   AMPUTATION Right 10/21/2020   Procedure: Right hallux amputation;  Surgeon: Wylene Simmer, MD;  Location: [place];  Service: Orthopedics;  Laterality: Right;   AMPUTATION TOE Bilateral 08/12/2020   Procedure: Right 3rd toe amputation; left hallux and 3rd toe amputation;  Surgeon: Wylene Simmer, MD;  Location: [place];  Service: Orthopedics;  Laterality: Bilateral;  72min   AMPUTATION TOE Right 03/24/2021   Procedure: AMPUTATION TOE;  Surgeon: Wylene Simmer, MD;  Location: [place];  Service: Orthopedics;  Laterality: Right;   APPENDECTOMY     BACK SURGERY     BIOPSY  03/30/2022   Procedure: BIOPSY;  Surgeon: Laurin Coder, MD;  Location: [place];  Service: Pulmonary;;   BRONCHIAL WASHINGS  03/30/2022   Procedure: BRONCHIAL WASHINGS;  Surgeon: Laurin Coder, MD;  Location: [place];  Service: Pulmonary;;   CHOLECYSTECTOMY     COLONOSCOPY N/A 06/02/2013   Procedure: COLONOSCOPY;   Surgeon: Wonda Horner, MD;  Location: [facility];  Service: Endoscopy;  Laterality: N/A;   HERNIA REPAIR  2000   INCISION AND DRAINAGE ABSCESS Left 07/07/2016   Procedure: DEBRIDMENT LEFT THIGH ABSCESS, EXISION ACUTE SKIN RASH LEFT THIGH(1CM LESION);  Surgeon: Fanny Skates, MD;  Location: [facility];  Service: General;  Laterality: Left;   SEPTOPLASTY  02/2013   spleen anuyism     splenic aneurysm     TRANSMETATARSAL AMPUTATION Bilateral 10/21/2020   Procedure: Left transmetatarsal amputation;  Surgeon: Wylene Simmer, MD;  Location: [place];  Service: Orthopedics;  Laterality: Bilateral;   TRANSMETATARSAL AMPUTATION Left 03/24/2021   Procedure: TRANSMETATARSAL AMPUTATION;  Surgeon: Wylene Simmer, MD;  Location: [place];  Service: Orthopedics;  Laterality: Left;   VAGINAL HYSTERECTOMY     VIDEO BRONCHOSCOPY Right 03/30/2022   Procedure: VIDEO BRONCHOSCOPY WITH FLUORO;  Surgeon: Laurin Coder, MD;  Location: [place] [facility];  Service: Pulmonary;  Laterality: Right;  atypical pneumonia   HPI:  56 year old female with a history of Wegener's angiitis, chronic opioid dependence, hyperlipidemia, hypertension, impaired glucose tolerance, 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 Presented to the hospital with complaints of intermittent altered mental status changes at home and increased weakness.  Symptoms have been present for the last 2-3 days and worsening according to patient's husband.  Patient has just completed her antibiotic therapy from most recent admission about 1 week prior to presentation.  The patient was recently admitted to the hospital from 03/26/2022 to 04/05/2022 when  she was treated for pneumonia.  She also had an acute pulmonary embolus for which she was treated with apixaban.  She had instructions to continue vancomycin until 04/08/2022.  The patient was followed by infectious disease.  She had a bronchoscopy on 03/30/2022.  Tissue cultures grew Candida, but her  clinical presentation was not consistent and the patient was improving without antifungal therapy.  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  This was felt to be also a contaminant. Per patient's husband he went to work and when he contacted patient she did not pick up the phone; on his arrival to home he found patient on the floor confused and unable to stand up.  Unclear if she tripped and fell, fell off her feet from ongoing weakness and deconditioning or ended passing out. Pt was seen by ST during most recent admission on 03/29/22, Pt communicated with SLP and no overt s/sx of aspiration were noted- recommendation for regular/thin. BSE requested secondary to reported coughing after thin presentations.    Assessment / Plan / Recommendation  Clinical Impression  Pt presents with jerky uncontrolled small movements of her head, arms, and hands and inability to speak for most of my visit. Pt was unable to produce her name, however she did say "Jeani Hawking", when asked where she was. Note overt s/sx of oropharyngeal dysphagia including poor oral control and coordination and coughing, wet coughing and delayed throat clearing with thin liquids and NTL. She tolerated HTL without overt s/sx of aspiration. Pt demonstrated poor coordination with regular and mech soft textures, prolonged oral stage, and mild oral residue after the swallow. Dysphagia seems to be related to coordination of oral stage and timing of swallow. Recommend downgrade diet to D2/fine chop diet and Honey thick liquids. ST will continue to follow for subjective diet upgrade or possible recommendation for instrumental testing if indicated. Above to RN. Thank you, SLP Visit Diagnosis: Dysphagia, unspecified (R13.10)    Aspiration Risk  Moderate aspiration risk    Diet Recommendation     Liquid Administration via: Cup;Straw Medication Administration: Crushed with puree Supervision:  Patient able to self feed Compensations: Slow rate;Small sips/bites Postural Changes: Seated upright at 90 degrees;Remain upright for at least 30 minutes after po intake    Other  Recommendations Oral Care Recommendations: Oral care BID Other Recommendations: Order thickener from pharmacy;Remove water pitcher    Recommendations for follow up therapy are one component of a multi-disciplinary discharge planning process, led by the attending physician.  Recommendations may be updated based on patient status, additional functional criteria and insurance authorization.           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 Presented to the hospital with complaints of intermittent altered mental status changes at home and increased weakness.  Symptoms have been present for the last 2-3 days and worsening according to patient's husband.  Patient has just completed her antibiotic therapy from most recent admission about 1 week prior to presentation.  The patient was recently admitted to the hospital from 03/26/2022 to 04/05/2022 when she was treated for pneumonia.  She also had an  acute pulmonary embolus for which she was treated with apixaban.  She had instructions to continue vancomycin until 04/08/2022.  The patient was followed by infectious disease.  She had a bronchoscopy on 03/30/2022.  Tissue cultures grew Candida, but her clinical presentation was not consistent and the patient was improving without antifungal therapy.  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  This was felt to be  also a contaminant. Per patient's husband he went to work and when he contacted patient she did not pick up the phone; on his arrival to home he found patient on the floor confused and unable to stand up.  Unclear if she tripped and fell, fell off her feet from ongoing weakness and deconditioning or ended passing out. Pt was seen by ST during most recent admission on 03/29/22, Pt communicated with SLP and no overt s/sx of aspiration were noted- recommendation for regular/thin. BSE requested secondary to reported coughing after thin presentations. Type of Study: Bedside Swallow Evaluation Previous Swallow Assessment: BSE on 03/29/22 Diet Prior to this Study: Regular;Thin liquids Temperature Spikes Noted: No History of Recent Intubation: No Behavior/Cognition: Alert;Cooperative;Pleasant mood Oral Cavity Assessment: Within Functional Limits Oral Care Completed by SLP: Recent completion by staff Oral Cavity - Dentition: Adequate natural dentition Vision: Functional for self-feeding Patient Positioning: Upright in bed Volitional Swallow: Able to elicit    Oral/Motor/Sensory Function Overall Oral Motor/Sensory Function: Other (comment)   Ice Chips Ice chips: Impaired Presentation: Spoon Oral Phase Functional Implications: Prolonged oral transit Pharyngeal Phase Impairments: Suspected delayed Swallow;Multiple swallows;Wet Vocal Quality   Thin Liquid Thin Liquid: Impaired Presentation: Cup;Straw Oral Phase Impairments: Poor awareness of bolus;Reduced lingual movement/coordination Oral Phase Functional Implications: Prolonged oral transit Pharyngeal  Phase Impairments: Multiple swallows;Unable to trigger swallow;Wet Vocal Quality;Throat Clearing - Delayed;Cough - Delayed;Cough - Immediate    Nectar Thick Nectar Thick Liquid: Impaired Presentation: Cup Oral Phase Impairments: Poor awareness of bolus;Reduced lingual movement/coordination Pharyngeal Phase Impairments: Wet Vocal Quality;Cough -  Immediate;Cough - Delayed   Honey Thick Honey Thick Liquid: Within functional limits   Puree Puree: Impaired Presentation: Spoon Oral Phase Impairments: Reduced lingual movement/coordination   Solid     Solid: Impaired Oral Phase Impairments: Reduced lingual movement/coordination Oral Phase Functional Implications: Impaired mastication Pharyngeal Phase Impairments: Wet Vocal Quality;Throat Clearing - Delayed;Cough - Delayed;Cough - Immediate     Aramis Weil H. Romie Levee, CCC-SLP Speech Language Pathologist  Georgetta Haber 04/21/2022,3:47 PM

## 2022-04-22 DIAGNOSIS — R6520 Severe sepsis without septic shock: Secondary | ICD-10-CM | POA: Diagnosis not present

## 2022-04-22 DIAGNOSIS — A419 Sepsis, unspecified organism: Secondary | ICD-10-CM | POA: Diagnosis not present

## 2022-04-22 LAB — GLUCOSE, CAPILLARY
Glucose-Capillary: 108 mg/dL — ABNORMAL HIGH (ref 70–99)
Glucose-Capillary: 151 mg/dL — ABNORMAL HIGH (ref 70–99)
Glucose-Capillary: 175 mg/dL — ABNORMAL HIGH (ref 70–99)
Glucose-Capillary: 184 mg/dL — ABNORMAL HIGH (ref 70–99)
Glucose-Capillary: 192 mg/dL — ABNORMAL HIGH (ref 70–99)

## 2022-04-22 LAB — HEPATIC FUNCTION PANEL
ALT: 140 U/L — ABNORMAL HIGH (ref 0–44)
ALT: 120 U/L — ABNORMAL HIGH (ref 0–44)
AST: 162 U/L — ABNORMAL HIGH (ref 15–41)
AST: 122 U/L — ABNORMAL HIGH (ref 15–41)
Albumin: 1.8 g/dL — ABNORMAL LOW (ref 3.5–5.0)
Albumin: 1.8 g/dL — ABNORMAL LOW (ref 3.5–5.0)
Alkaline Phosphatase: 69 U/L (ref 38–126)
Alkaline Phosphatase: 73 U/L (ref 38–126)
Bilirubin, Direct: <0.1 mg/dL (ref 0.0–0.2)
Bilirubin, Direct: <0.1 mg/dL (ref 0.0–0.2)
Total Bilirubin: 0.9 mg/dL (ref 0.3–1.2)
Total Bilirubin: 1 mg/dL (ref 0.3–1.2)
Total Protein: 4.6 g/dL — ABNORMAL LOW (ref 6.5–8.1)
Total Protein: 4.6 g/dL — ABNORMAL LOW (ref 6.5–8.1)

## 2022-04-22 LAB — CBC
HCT: 25.3 % — ABNORMAL LOW (ref 36.0–46.0)
Hemoglobin: 8.1 g/dL — ABNORMAL LOW (ref 12.0–15.0)
MCH: 25.7 pg — ABNORMAL LOW (ref 26.0–34.0)
MCHC: 32 g/dL (ref 30.0–36.0)
MCV: 80.3 fL (ref 80.0–100.0)
Platelets: 271 10*3/uL (ref 150–400)
RBC: 3.15 MIL/uL — ABNORMAL LOW (ref 3.87–5.11)
RDW: 20.6 % — ABNORMAL HIGH (ref 11.5–15.5)
WBC: 10.9 10*3/uL — ABNORMAL HIGH (ref 4.0–10.5)
nRBC: 0 % (ref 0.0–0.2)

## 2022-04-22 LAB — RENAL FUNCTION PANEL
Albumin: 1.8 g/dL — ABNORMAL LOW (ref 3.5–5.0)
Anion gap: 17 — ABNORMAL HIGH (ref 5–15)
BUN: 67 mg/dL — ABNORMAL HIGH (ref 6–20)
CO2: 17 mmol/L — ABNORMAL LOW (ref 22–32)
Calcium: 6.2 mg/dL — CL (ref 8.9–10.3)
Chloride: 90 mmol/L — ABNORMAL LOW (ref 98–111)
Creatinine, Ser: 6.23 mg/dL — ABNORMAL HIGH (ref 0.44–1.00)
GFR, Estimated: 7 mL/min — ABNORMAL LOW (ref >60)
Glucose, Bld: 148 mg/dL — ABNORMAL HIGH (ref 70–99)
Phosphorus: 7.7 mg/dL — ABNORMAL HIGH (ref 2.5–4.6)
Potassium: 4.9 mmol/L (ref 3.5–5.1)
Sodium: 124 mmol/L — ABNORMAL LOW (ref 135–145)

## 2022-04-22 LAB — OSMOLALITY, URINE: Osmolality, Ur: 189 mOsm/kg — ABNORMAL LOW (ref 300–900)

## 2022-04-22 LAB — CULTURE, BLOOD (ROUTINE X 2)
Culture: NO GROWTH
Culture: NO GROWTH
Special Requests: ADEQUATE

## 2022-04-22 LAB — CK: Total CK: 4496 U/L — ABNORMAL HIGH (ref 38–234)

## 2022-04-22 LAB — OSMOLALITY: Osmolality: 284 mOsm/kg (ref 275–295)

## 2022-04-22 LAB — SODIUM, URINE, RANDOM: Sodium, Ur: 63 mmol/L

## 2022-04-22 MED ORDER — ALBUTEROL SULFATE (2.5 MG/3ML) 0.083% IN NEBU: 2.5000 mg | INHALATION_SOLUTION | Freq: Two times a day (BID) | RESPIRATORY_TRACT | Status: DC | PRN

## 2022-04-22 MED ORDER — ALBUTEROL SULFATE HFA 108 (90 BASE) MCG/ACT IN AERS
2.0000 | INHALATION_SPRAY | Freq: Two times a day (BID) | RESPIRATORY_TRACT | Status: DC | PRN
Start: 2022-04-22 — End: 2022-04-22

## 2022-04-22 MED ORDER — TAPENTADOL HCL 50 MG PO TABS: 25.0000 mg | ORAL_TABLET | Freq: Four times a day (QID) | ORAL | Status: DC

## 2022-04-22 MED ORDER — FUROSEMIDE 10 MG/ML IJ SOLN
120.0000 mg | Freq: Two times a day (BID) | INTRAVENOUS | Status: AC
  Administered 2022-04-22 – 2022-04-23 (×2): 120 mg via INTRAVENOUS
  Filled 2022-04-22: qty 2
  Filled 2022-04-22: qty 10

## 2022-04-22 MED ORDER — FUROSEMIDE 10 MG/ML IJ SOLN
80.0000 mg | Freq: Once | INTRAMUSCULAR | Status: AC
Start: 2022-04-22 — End: 2022-04-22
  Administered 2022-04-22: 80 mg via INTRAVENOUS
  Filled 2022-04-22: qty 8

## 2022-04-22 NOTE — Progress Notes (Signed)
Nephrology Follow-Up Consult note   Assessment/Recommendations: [name] is a/an 56 y.o. female with a past medical history significant for GPA, HLD, HTN, admitted for AMS c/b AKI and COVID.       AKI: nonoliguric as of yesterday. Unlikely related to ANCA as Crt was normal very recently. UA mainly with infection and minimal hematuria. Most likely has had ATN.  Clear urine output over the past 24 hours during her transfer -Avoiding IV fluids as patient is more volume overloaded at this time -Continue to monitor daily Cr, Dose meds for GFR -Monitor Daily I/Os, Daily weight  -Maintain MAP>65 for optimal renal perfusion.  -Avoid nephrotoxic medications including NSAIDs -Use synthetic opioids (Fentanyl/Dilaudid) if needed -Currently no indication for HD but will monitor closely  Hyponatremia: Sodium down to 124.  Likely related to volume excess in the setting of AKI.  Urine sodium likely high related to ATN.  Unreliable at this time.  We will give IV Lasix 80 mg now 120 mg this evening to help with free water excess  AMS: Transferred here for further evaluation.  MRI today.  Appreciate neurology input  Metabolic acidosis: Bicarb 17.  Hopefully will improve with diuresis.  Continue to monitor  Hypocalcemia: Calcium 6.2 corrects to 8. Unlikely low enough to cause symptoms but follow iCal and replete if needed  Sepsis: Source PNA or UTI. abx per primary team  GPA: follows with rheum receiving MTX possibly ritux in the past but unsure when. Would be rare for AMS to be from [facility] while receiving immunosuppression but can happen. MRI may help. ON home steroids. Holding MTX.  Rhabdo: CK trending down. Holding fluids given volume overload.  COVID: minimal symptoms. mgmt per primary   Recommendations conveyed to primary service.    Darnell Level [facility] 04/22/2022 9:14 AM  ___________________________________________________________  CC: AKI, COVID, AMS  Interval  History/Subjective: Patient is not interactive today.  Twitching and staring but unable to answer questions.  Sodium slightly decreased today.   Medications:  Current Facility-Administered Medications  Medication Dose Route Frequency Provider Last Rate Last Admin   acetaminophen (TYLENOL) tablet 650 mg  650 mg Oral Q6H PRN Tat, Onalee Hua, MD   650 mg at 04/21/22 2253   apixaban (ELIQUIS) tablet 5 mg  5 mg Oral BID Catarina Hartshorn, MD   5 mg at 04/22/22 1610   Chlorhexidine Gluconate Cloth 2 % PADS 6 each  6 each Topical Daily Tat, Onalee Hua, MD   6 each at 04/22/22 9604   DULoxetine (CYMBALTA) DR capsule 60 mg  60 mg Oral BID Catarina Hartshorn, MD   60 mg at 04/22/22 5409   fluconazole (DIFLUCAN) tablet 100 mg  100 mg Oral Daily Tat, Onalee Hua, MD   100 mg at 04/22/22 8119   folic acid (FOLVITE) tablet 2 mg  2 mg Oral Daily Tat, David, MD   2 mg at 04/22/22 0820   furosemide (LASIX) 120 mg in dextrose 5 % 50 mL IVPB  120 mg Intravenous Q12H Darnell Level, MD       guaiFENesin-dextromethorphan (ROBITUSSIN DM) 100-10 MG/5ML syrup 10 mL  10 mL Oral Pablo Ledger, MD   10 mL at 04/22/22 0821   insulin aspart (novoLOG) injection 0-9 Units  0-9 Units Subcutaneous TID WC Catarina Hartshorn, MD   1 Units at 04/21/22 1807   insulin glargine-yfgn (SEMGLEE) injection 4 Units  4 Units Subcutaneous Maura Crandall, MD   4 Units at 04/21/22 2253   ipratropium-albuterol (DUONEB) 0.5-2.5 (  3) MG/3ML nebulizer solution 3 mL  3 mL Nebulization BID Tat, David, MD   3 mL at 04/22/22 0847   ipratropium-albuterol (DUONEB) 0.5-2.5 (3) MG/3ML nebulizer solution 3 mL  3 mL Nebulization Q6H PRN Tat, Onalee Hua, MD   3 mL at 04/22/22 0018   lidocaine (LIDODERM) 5 % 1 patch  1 patch Transdermal Daily PRN Catarina Hartshorn, MD   1 patch at 04/17/22 2123   linaclotide (LINZESS) capsule 145 mcg  145 mcg Oral QAC breakfast Tat, Onalee Hua, MD   145 mcg at 04/22/22 0626   linezolid (ZYVOX) IVPB 600 mg  600 mg Intravenous Pablo Ledger, MD 300 mL/hr at 04/22/22 0030 600  mg at 04/22/22 0030   meropenem (MERREM) 500 mg in sodium chloride 0.9 % 100 mL IVPB  500 mg Intravenous Q12H Tat, Onalee Hua, MD 200 mL/hr at 04/21/22 2200 500 mg at 04/21/22 2200   methocarbamol (ROBAXIN) tablet 500 mg  500 mg Oral Q8H PRN Tat, Onalee Hua, MD   500 mg at 04/20/22 2209   ondansetron (ZOFRAN) tablet 4 mg  4 mg Oral Q6H PRN Tat, Onalee Hua, MD       Or   ondansetron (ZOFRAN) injection 4 mg  4 mg Intravenous Q6H PRN Tat, Onalee Hua, MD       pantoprazole (PROTONIX) EC tablet 40 mg  40 mg Oral Daily Tat, Onalee Hua, MD   40 mg at 04/22/22 9485   predniSONE (DELTASONE) tablet 20 mg  20 mg Oral BID WC Catarina Hartshorn, MD   20 mg at 04/22/22 4627   senna (SENOKOT) tablet 17.2 mg  2 tablet Oral BID Catarina Hartshorn, MD   17.2 mg at 04/21/22 0350   vitamin B-12 (CYANOCOBALAMIN) tablet 1,000 mcg  1,000 mcg Oral Daily Tat, David, MD   1,000 mcg at 04/22/22 0820      Review of Systems: Unable to obtain due to the patient's altered mental status  Physical Exam: Vitals:   04/22/22 0800 04/22/22 0848  BP: (!) 138/92   Pulse: 81   Resp: 20   Temp: 97.9 F (36.6 C)   SpO2: (!) 5% 95%   No intake/output data recorded.  Intake/Output Summary (Last 24 hours) at 04/22/2022 0914 Last data filed at 04/21/2022 1700 Gross per 24 hour  Intake 25 ml  Output --  Net 25 ml   Constitutional: Lying in bed, twitching ENMT: ears and nose without scars or lesions, MMM CV: normal rate, 1+ edema to bilateral ankles Respiratory: Bilateral chest rise, normal work of breathing Gastrointestinal: soft, non-tender, no palpable masses or hernias Skin: no visible lesions or rashes Psych: Awake and alert, does not interact   Test Results I personally reviewed new and old clinical labs and radiology tests Lab Results  Component Value Date   NA 124 (L) 04/22/2022   K 4.9 04/22/2022   CL 90 (L) 04/22/2022   CO2 17 (L) 04/22/2022   BUN 67 (H) 04/22/2022   CREATININE 6.23 (H) 04/22/2022   CALCIUM 6.2 (LL) 04/22/2022   ALBUMIN 1.8  (L) 04/22/2022   ALBUMIN 1.8 (L) 04/22/2022   PHOS 7.7 (H) 04/22/2022    CBC Recent Labs  Lab 04/17/22 1124 04/18/22 0326 04/20/22 0311 04/22/22 0336  WBC 24.5* 19.1* 12.4* 10.9*  NEUTROABS 18.4*  --  10.5*  --   HGB 11.2* 10.1* 8.2* 8.1*  HCT 38.1 34.3* 25.6* 25.3*  MCV 90.5 88.9 82.8 80.3  PLT 286 271 245 271

## 2022-04-22 NOTE — Progress Notes (Signed)
Ca++ 6.2 called to oncall provider,

## 2022-04-22 NOTE — Progress Notes (Signed)
Report given to [facility] ink and receiving nurse at [facility] regarding patient. Transport in route to pick up patient

## 2022-04-22 NOTE — Progress Notes (Signed)
HOSPITAL MEDICINE OVERNIGHT EVENT NOTE    Notified by nursing that patient's calcium this morning is 6.2 that does not correct to normal despite correcting for hypoalbuminemia.  We will obtain ionized calcium to confirm degree of hypocalcemia.    Further review of labs this morning reveals worsening renal injury with creatinine now at 6.23 as well as worsening hyponatremia with sodium of 124.  Considering the patient's progressive renal injury, nephrology is following this patient daily and is advising against further volume resuscitation at this time.  Renal injury is possibly related to rhabdomyolysis and urinary tract infection.  Concerning patient's progressive hypervolemic hyponatremia, will obtain urine osmolality, serum osmolality, urine sodium.  TSH obtained 6/28 is unremarkable.  Patient is already on scheduled per nephrology recommendations.  Marinda Elk  MD [facility]

## 2022-04-22 NOTE — Progress Notes (Addendum)
Called by nurse for patient wheezing. Patient has mostly upper airway wheezes. Also some in wheezing in lung fields. Has congested cough. Her work of breathing is increased some so placed on 3 liters oxygen. She is hard to understand as does not Talk. According to chart of I/O she is 7 liters ahead on fluid. Did not note any crackles in lung fields. Patient is awake and watching TV.

## 2022-04-22 NOTE — Progress Notes (Signed)
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  Patient presents with worsening intermittent altered mental status and increased weakness.  She has been declining since approximately January, 6 months ago per husband.  She was recently admitted to our facility and treated for pneumonia with moderate improvement.  She was to continue on vancomycin through 04/08/2022 followed by infectious disease.  Bronchoscopy 03/30/2022 shows Candida in the setting of ongoing antibiotics, deemed to be colonized and not active infection given fungitel was reassuringly low.  Of note she is also had a recent diagnosis of pulmonary embolism on apixaban.  Patient admitted again with elevated white count lactic acid and acute renal failure in the setting of poor p.o. intake altered mental status tachycardia and tachypnea concerning for sepsis.  Patient's source was presumed to be pneumonia versus UTI placed initially on broad-spectrum antibiotics of vancomycin and cefepime.  Her sepsis criteria did not improve and her renal function continued to worsen in the setting of presumed sepsis and rhabdomyolysis.  Nephrology following with worsening mental status, tremors/muscle spasms with metabolic derangements.  Patient transferred to main campus for neurology evaluation, EEG ultimately unremarkable for seizures-her movement was considered to be musculoskeletal, somewhat choreiform in nature.   Assessment & Plan:   Principal Problem:   Severe sepsis (HCC) Active Problems:   Acute cystitis without hematuria   Current chronic use of systemic steroids   Chronic pain   Secondary adrenal insufficiency (HCC)   AKI (acute kidney injury)  (HCC)   Granulomatosis with polyangiitis (HCC)   GERD (gastroesophageal reflux disease)   Essential hypertension   Obesity (BMI 30-39.9)   COVID-19 virus infection   Acute metabolic encephalopathy   Pulmonary embolism (HCC)   Transaminitis   Rhabdomyolysis   Acute metabolic encephalopathy, POA, worsening -Initially presumed to be secondary to infection and renal failure hyponatremia  -Despite aggressive treatment with antibiotics supportive care IV fluids patient's mental status has worsened if anything -Discontinue antibiotics as below, monitor for CNS depressants -Discussed case with rheumatology, as below we will follow along for possible imaging to rule out CNS vasculitis presentation of granulomatosis/polyangiitis -EEG unremarkable for seizure -Previously discontinued medications include gabapentin, cefepime, Nucynta -Appreciate neurology insight and recommendations in this difficult case  Granulomatosis with polyangiitis [facility]) -Patient receiving rituximab and methotrexate as an outpatient(most recently at [facility] in May 2023) - confirmed with rheumatology at [facility] -Continue prednisone as below -Patient has had somewhat splintered medical care in the setting of rheumatology due to her complex disease state, plan to follow-up at [facility] but has been too unstable require multiple hospital admissions and has not yet been able to follow-up at this facility. -Discussed case with Dr. Dimple Casey rheumatology at [facility], pending imaging of the brain to confirm potential CNS vasculitis he would be willing to accept the patient to their service at [facility] for closer monitoring and to further discuss more novel/off label treatment as indicated (cyclophosphamide, avacopan)  AKI (acute kidney injury) (HCC) -Acute renal failure with a creatinine of 1.73 at time of admission. -Complicated and multifactorial etiology although likely primarily driven by granulomatosis as above  -Renal ultrasound  demonstrating no obstructive uropathy -Nephrology following -off aggressive IV fluids given volume status  Presumed severe sepsis secondary to pneumonia versus UTI, resolved (HCC) -Presumed at  intake in the setting of leukocytosis tachycardia and low-grade temperature -Unfortunately procalcitonin can be falsely positive in vasculitis/renal dysfunction -Patient was presumed to have a UTI versus pneumonia given her history, has now completed 5 days of antibiotics we have discussed this case with infectious disease, given her prolonged history of antibiotics and no clear source at this time we will discontinue antibiotics and follow clinically for fever/source. -Patient no longer on aggressive IV fluids per nephrology -Cultures remain negative -sepsis physiology resolved previously   Acute cystitis without hematuria -Antibiotics discontinued, discussed case with ID, yeast in urine likely contaminant in the setting of prolonged antibiotics and colonization   Acute hypoxic respiratory failure, POA -Multifactorial in the setting of metabolic encephalopathy, presumed pneumonia, granulomatosis/polyangiitis, COVID-19 infection -Potentially exacerbated by hypervolemia  Secondary adrenal insufficiency (HCC) -Continue steroids as above   Chronic pain -Discontinued home Neurontin and Nucynta given mental status changes  Current chronic use of systemic steroids -5 mg of prednisone on daily basis at baseline -Solu-Cortef x1 given while in the ED at time of admission. -Continue higher dose of prednisone 20 mg bid -may decrease steroid regimen in the next 24 to 48 hours to rule out steroid-induced psychosis given mental status changes as above  GERD (gastroesophageal reflux disease) -Continue PPI.   Essential hypertension -Currently well controlled off home regimen, continue to follow clinically  Rhabdomyolysis, improving -Secondary to prolonged downtime -CPK downtrending appropriately in the  setting of aggressive IV fluids, renal function continues to deteriorate however as above -Defer to nephrology for ongoing fluids and diuretics   Elevated AST/ALT, resolving -Most likely in the setting of sepsis/shock liver and rhabdomyolysis. -Downtrending appropriately   Pulmonary embolism ([place]) -Somewhat new diagnosis, diagnosed during previous hospitalization 03/27/2022  -Continue Eliquis   COVID-19 virus infection - COVID PCR incidentally positive at time of admission -without respiratory symptoms - Inflammatory markers suggesting bacterial infection and very likely component of underlying rheumatologic condition. -Procalcitonin elevated, outlined as above this can be falsely positive and vasculitis in ESRD patients -Steroids ongoing as above -Remdesivir withheld due to elevated liver enzymes   Obesity (BMI 30-39.9) -Class I obesity -Body mass index is 33.38 kg/m.   Chronic opioid dependence ([place]) - Patient has intrathecal pump with bupivacaine and ziconotide,  -Last refill 03/02/2022 - Patient receives Nucynta 75 mg, #120; last refill 03/17/2022 - Patient is also on oxycodone 10 mg; #30, last refilled 02/23/2022 - Narcotics weaned down(eventually stopped) given worsening mental status - Avoid CNS depressants   Chemotherapy-induced peripheral neuropathy (HCC) At home--is on gabapentin, Cymbalta, amitriptyline topiramate, trazodone, nucynta--all have been held given worsening mental status   DVT prophylaxis: eliquis Code Status: Full Family Communication: Husband at bedside  Status is: Inpatient  Dispo: The patient is from: Home              Anticipated d/c is to: To be determined              Anticipated d/c date is: To be determined              Patient currently not medically stable for discharge  Consultants:  Nephro, neuro  Procedures:  None  Antimicrobials:  Discontinued 04/22/2022  Subjective: No acute issues or events overnight, mental status appears to be  worsening per husband at bedside, review of systems from patient is markedly limited, only able to say "yeah" regardless of questioning.  Objective: Vitals:   04/22/22 0023 04/22/22 0042 04/22/22 0230 04/22/22 0310  BP:    138/87  Pulse:  80  81  Resp:  (!) 28  20  Temp:    97.9 F (36.6 C)  TempSrc:    Oral  SpO2: 95% 97%  100%  Weight:   111 kg   Height:        Intake/Output Summary (Last 24 hours) at 04/22/2022 0749 Last data filed at 04/21/2022 1700 Gross per 24 hour  Intake 145 ml  Output --  Net 145 ml   Filed Weights   04/18/22 0500 04/19/22 0500 04/22/22 0230  Weight: 91 kg 91 kg 111 kg    Examination:  General:  Pleasantly resting in bed, No acute distress. HEENT:  Normocephalic atraumatic.  Sclerae nonicteric, noninjected.  Extraocular movements intact bilaterally. Neck:  Without mass or deformity.  Trachea is midline. Lungs: Diminished bilaterally with scant inspiratory and expiratory wheezes secondary to upper airway anatomy. Heart:  Regular rate and rhythm.  Without murmurs, rubs, or gallops. Abdomen:  Soft, nontender, nondistended.  Without guarding or rebound. Neuro: Patient unable to respond appropriately to questions or follow Simple commands, states "yeah" to most verbal prompts or questions or simply does not respond at all.  She does track motion and audio with her eyes.  She does appear to be able to nod her head yes or no somewhat appropriately to simple questions but this is not consistent.  Initially able to respond appropriately to commands such as "squeeze my hands" but this became inconsistent throughout the day Vascular:  Dorsalis pedis and posterior tibial pulses palpable bilaterally. Skin:  Warm and dry, no erythema,   Data Reviewed: I have personally reviewed following labs and imaging studies  CBC: Recent Labs  Lab 04/17/22 1124 04/18/22 0326 04/20/22 0311 04/22/22 0336  WBC 24.5* 19.1* 12.4* 10.9*  NEUTROABS 18.4*  --  10.5*  --   HGB  11.2* 10.1* 8.2* 8.1*  HCT 38.1 34.3* 25.6* 25.3*  MCV 90.5 88.9 82.8 80.3  PLT 286 271 245 271   Basic Metabolic Panel: Recent Labs  Lab 04/18/22 0326 04/19/22 0333 04/20/22 0311 04/21/22 0522 04/21/22 1242 04/22/22 0336  NA 134* 129* 127*  128* 126*  --  124*  K 4.8 5.4* 4.9  4.9 5.1  --  4.9  CL 106 102 94*  96* 93*  --  90*  CO2 18* 15* 23  22 22   --  17*  GLUCOSE 252* 187* 156*  155* 143* 108* 148*  BUN 27* 43* 51*  52* 61*  --  67*  CREATININE 2.24* 3.68*  3.57* 4.37*  4.36* 5.36*  5.38*  --  6.23*  CALCIUM 7.8* 7.1* 6.6*  6.7* 6.5*  --  6.2*  PHOS  --  4.7* 5.4* 6.2*  --  7.7*   GFR: Estimated Creatinine Clearance: 12.5 mL/min (A) (by C-G formula based on SCr of 6.23 mg/dL (H)). Liver Function Tests: Recent Labs  Lab 04/17/22 1124 04/18/22 0326 04/19/22 0333 04/20/22 0311 04/21/22 0522 04/22/22 0336  AST 202* 933*  --  438*  --  162*  ALT 54* 202*  --  203*  --  140*  ALKPHOS 103 76  --  80  --  69  BILITOT 0.7 0.5  --  0.6  --  0.9  PROT 5.9* 4.7*  --  4.5*  --  4.6*  ALBUMIN 2.9* 2.3* 2.0* 1.9*  1.9* 2.0* 1.8*  1.8*   No results for input(s): "LIPASE", "AMYLASE" in the last 168 hours. Recent Labs  Lab 04/20/22 1411  AMMONIA 14  Coagulation Profile: Recent Labs  Lab 04/17/22 1124  INR 1.8*   Cardiac Enzymes: Recent Labs  Lab 04/18/22 1300 04/20/22 0311 04/21/22 0522 04/22/22 0336  CKTOTAL 26,433* 13,704* 6,333* 4,496*   BNP (last 3 results) No results for input(s): "PROBNP" in the last 8760 hours. HbA1C: No results for input(s): "HGBA1C" in the last 72 hours. CBG: Recent Labs  Lab 04/21/22 1238 04/21/22 1248 04/21/22 1250 04/21/22 1659 04/22/22 0038  GLUCAP 100* 115* 105* 145* 151*   Lipid Profile: No results for input(s): "CHOL", "HDL", "LDLCALC", "TRIG", "CHOLHDL", "LDLDIRECT" in the last 72 hours. Thyroid Function Tests: Recent Labs    04/20/22 1411  FREET4 0.56*   Anemia Panel: Recent Labs     04/20/22 1412  VITAMINB12 909   Sepsis Labs: Recent Labs  Lab 04/17/22 1125 04/17/22 1248 04/17/22 1756 04/17/22 2028 04/18/22 0326 04/19/22 0333 04/20/22 0311  PROCALCITON  --   --   --   --  4.45 4.17 2.95  LATICACIDVEN 4.7* 4.0* 3.9* 3.9*  --   --   --     Recent Results (from the past 240 hour(s))  Culture, blood (Routine x 2)     Status: None   Collection Time: 04/17/22 11:25 AM   Specimen: Left Antecubital; Blood  Result Value Ref Range Status   Specimen Description LEFT ANTECUBITAL  Final   Special Requests   Final    BOTTLES DRAWN AEROBIC AND ANAEROBIC Blood Culture results may not be optimal due to an inadequate volume of blood received in culture bottles   Culture   Final    NO GROWTH 5 DAYS Performed at [facility] [address] [place], [place] [address]    Report Status 04/22/2022 FINAL  Final  Culture, blood (Routine x 2)     Status: None   Collection Time: 04/17/22 11:34 AM   Specimen: BLOOD LEFT ARM  Result Value Ref Range Status   Specimen Description BLOOD LEFT ARM  Final   Special Requests   Final    BOTTLES DRAWN AEROBIC AND ANAEROBIC Blood Culture adequate volume   Culture   Final    NO GROWTH 5 DAYS Performed at [facility], [address], [place], [place] [address]    Report Status 04/22/2022 FINAL  Final  Urine Culture     Status: Abnormal   Collection Time: 04/17/22 11:53 AM   Specimen: Urine, Catheterized  Result Value Ref Range Status   Specimen Description   Final    URINE, CATHETERIZED Performed at [facility], [address], [place], [place] [address]    Special Requests   Final    NONE Performed at [facility], [address], [place], [place] [address]    Culture >=100,000 COLONIES/mL YEAST (A)  Final   Report Status 04/19/2022 FINAL  Final  Resp Panel by RT-PCR (Flu A&B, Covid) Anterior Nasal Swab     Status: Abnormal   Collection Time: 04/17/22 11:57 AM   Specimen: Anterior Nasal Swab  Result Value Ref Range Status    SARS Coronavirus 2 by RT PCR POSITIVE (A) NEGATIVE Final    Comment: (NOTE) SARS-CoV-2 target nucleic acids are DETECTED.  The SARS-CoV-2 RNA is generally detectable in upper respiratory specimens during the acute phase of infection. Positive results are indicative of the presence of the identified virus, but do not rule out bacterial infection or co-infection with other pathogens not detected by the test. Clinical correlation with patient history and other diagnostic information is necessary to determine patient  infection status. The expected result is Negative.  Fact Sheet for Patients: EntrepreneurPulse.com.au  Fact Sheet for Healthcare Providers: IncredibleEmployment.be  This test is not yet approved or cleared by the [place] FDA and  has been authorized for detection and/or diagnosis of SARS-CoV-2 by FDA under an Emergency Use Authorization (EUA).  This EUA will remain in effect (meaning this test can be used) for the duration of  the COVID-19 declaration under Section 564(b)(1) of the A ct, 21 U.S.C. section 360bbb-3(b)(1), unless the authorization is terminated or revoked sooner.     Influenza A by PCR NEGATIVE NEGATIVE Final   Influenza B by PCR NEGATIVE NEGATIVE Final    Comment: (NOTE) The Xpert Xpress SARS-CoV-2/FLU/RSV plus assay is intended as an aid in the diagnosis of influenza from Nasopharyngeal swab specimens and should not be used as a sole basis for treatment. Nasal washings and aspirates are unacceptable for Xpert Xpress SARS-CoV-2/FLU/RSV testing.  Fact Sheet for Patients: EntrepreneurPulse.com.au  Fact Sheet for Healthcare Providers: IncredibleEmployment.be  This test is not yet approved or cleared by the [place] FDA and has been authorized for detection and/or diagnosis of SARS-CoV-2 by FDA under an Emergency Use Authorization (EUA). This EUA will remain in effect  (meaning this test can be used) for the duration of the COVID-19 declaration under Section 564(b)(1) of the Act, 21 U.S.C. section 360bbb-3(b)(1), unless the authorization is terminated or revoked.  Performed at [facility], [address] [place], [place] [address]          Radiology Studies: EEG adult  Result Date: 05/14/2022 Lora Havens, MD     14-May-2022  8:54 PM Patient Name: Vitoria Lovorn MRN: [id number] Epilepsy Attending: Lora Havens Referring Physician/Provider: Orson Eva, MD Date: 05/14/22 Duration: 24.13 mins Patient history: 56yo F with ams. EEG to evaluate for seizure. Level of alertness: Awake AEDs during EEG study: None Technical aspects: This EEG study was done with scalp electrodes positioned according to the 10-20 International system of electrode placement. Electrical activity was acquired at a sampling rate of 500Hz  and reviewed with a high frequency filter of 70Hz  and a low frequency filter of 1Hz . EEG data were recorded continuously and digitally stored. Description: No clear posterior dominant rhythm was seen. EEG showed continuous generalized predominantly 5-7 Hz theta slowing admixed with intermittent generalized 2-3hz  delta slowing. Patient was noted to have brief intermittent whole body twitching as well as asynchronous and non- rhythmic twitching of arms and legs. Concomitant EEG before, during and after the event did not show any EEG changes suggest seizure. Hyperventilation and photic stimulation were not performed.   ABNORMALITY - Continuous slow, generalized IMPRESSION: This study is suggestive of moderate diffuse encephalopathy, nonspecific etiology. No seizures or epileptiform discharges were seen throughout the recording. Patient was noted to have brief intermittent whole body twitching as well as asynchronous and non- rhythmic twitching of arms and legs without concomitant EEG change. These episodes were NOT epileptic. Priyanka Barbra Sarks    Scheduled  Meds:  apixaban  5 mg Oral BID   Chlorhexidine Gluconate Cloth  6 each Topical Daily   DULoxetine  60 mg Oral BID   fluconazole  100 mg Oral Daily   folic acid  2 mg Oral Daily   furosemide  40 mg Intravenous Q12H   guaiFENesin-dextromethorphan  10 mL Oral Q12H   insulin aspart  0-9 Units Subcutaneous TID WC   insulin glargine-yfgn  4 Units Subcutaneous QHS   ipratropium-albuterol  3 mL Nebulization BID  linaclotide  145 mcg Oral QAC breakfast   pantoprazole  40 mg Oral Daily   predniSONE  20 mg Oral BID WC   senna  2 tablet Oral BID   vitamin B-12  1,000 mcg Oral Daily   Continuous Infusions:  linezolid (ZYVOX) IV 600 mg (04/22/22 0030)   meropenem (MERREM) IV 500 mg (04/21/22 2200)    LOS: 5 days   Time spent: 25min  Dorise Gangi C Jonee Lamore, DO [facility]  If 7PM-7AM, please contact night-coverage [email or web address]  04/22/2022, 7:49 AM

## 2022-04-23 ENCOUNTER — Inpatient Hospital Stay (HOSPITAL_COMMUNITY): Payer: BC Managed Care – PPO

## 2022-04-23 DIAGNOSIS — A419 Sepsis, unspecified organism: Secondary | ICD-10-CM | POA: Diagnosis not present

## 2022-04-23 DIAGNOSIS — R6520 Severe sepsis without septic shock: Secondary | ICD-10-CM | POA: Diagnosis not present

## 2022-04-23 DIAGNOSIS — G9341 Metabolic encephalopathy: Secondary | ICD-10-CM | POA: Diagnosis not present

## 2022-04-23 DIAGNOSIS — R278 Other lack of coordination: Secondary | ICD-10-CM

## 2022-04-23 LAB — RENAL FUNCTION PANEL
Albumin: 2 g/dL — ABNORMAL LOW (ref 3.5–5.0)
Anion gap: 19 — ABNORMAL HIGH (ref 5–15)
BUN: 83 mg/dL — ABNORMAL HIGH (ref 6–20)
CO2: 18 mmol/L — ABNORMAL LOW (ref 22–32)
Calcium: 6.5 mg/dL — ABNORMAL LOW (ref 8.9–10.3)
Chloride: 89 mmol/L — ABNORMAL LOW (ref 98–111)
Creatinine, Ser: 7.59 mg/dL — ABNORMAL HIGH (ref 0.44–1.00)
GFR, Estimated: 6 mL/min — ABNORMAL LOW (ref >60)
Glucose, Bld: 131 mg/dL — ABNORMAL HIGH (ref 70–99)
Phosphorus: 9.3 mg/dL — ABNORMAL HIGH (ref 2.5–4.6)
Potassium: 4.6 mmol/L (ref 3.5–5.1)
Sodium: 126 mmol/L — ABNORMAL LOW (ref 135–145)

## 2022-04-23 LAB — HEPATITIS B SURFACE ANTIGEN: Hepatitis B Surface Ag: NONREACTIVE

## 2022-04-23 LAB — HEPATITIS B CORE ANTIBODY, TOTAL: Hep B Core Total Ab: NONREACTIVE

## 2022-04-23 LAB — GLUCOSE, CAPILLARY
Glucose-Capillary: 119 mg/dL — ABNORMAL HIGH (ref 70–99)
Glucose-Capillary: 122 mg/dL — ABNORMAL HIGH (ref 70–99)
Glucose-Capillary: 107 mg/dL — ABNORMAL HIGH (ref 70–99)
Glucose-Capillary: 115 mg/dL — ABNORMAL HIGH (ref 70–99)

## 2022-04-23 LAB — HEPATITIS B CORE ANTIBODY, IGM: Hep B C IgM: NONREACTIVE

## 2022-04-23 LAB — CK: Total CK: 4061 U/L — ABNORMAL HIGH (ref 38–234)

## 2022-04-23 LAB — HIV ANTIBODY (ROUTINE TESTING W REFLEX): HIV Screen 4th Generation wRfx: NONREACTIVE

## 2022-04-23 LAB — HEPATITIS C ANTIBODY: HCV Ab: NONREACTIVE

## 2022-04-23 MED ORDER — ENOXAPARIN SODIUM 120 MG/0.8ML IJ SOSY
110.0000 mg | PREFILLED_SYRINGE | INTRAMUSCULAR | Status: DC
Start: 2022-04-23 — End: 2022-04-23
  Filled 2022-04-23: qty 0.74

## 2022-04-23 MED ORDER — ENOXAPARIN SODIUM 120 MG/0.8ML IJ SOSY
110.0000 mg | PREFILLED_SYRINGE | INTRAMUSCULAR | Status: DC
  Administered 2022-04-23 – 2022-04-27 (×5): 110 mg via SUBCUTANEOUS
  Filled 2022-04-23 (×6): qty 0.74

## 2022-04-23 NOTE — Progress Notes (Addendum)
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  Patient presents with worsening intermittent altered mental status and increased weakness.  She has been declining since approximately January, 6 months ago per husband.  She was recently admitted to our facility and treated for pneumonia with moderate improvement.  She was to continue on vancomycin through 04/08/2022 followed by infectious disease.  Bronchoscopy 03/30/2022 shows Candida in the setting of ongoing antibiotics, deemed to be colonized and not active infection given fungitel was reassuringly low.  Of note she is also had a recent diagnosis of pulmonary embolism on apixaban.  Patient admitted again with elevated white count lactic acid and acute renal failure in the setting of poor p.o. intake altered mental status tachycardia and tachypnea concerning for sepsis.  Patient's source was presumed to be pneumonia versus UTI placed initially on broad-spectrum antibiotics of vancomycin and cefepime.  Her sepsis criteria did not improve and her renal function continued to worsen in the setting of presumed sepsis and rhabdomyolysis.  Nephrology following with worsening mental status, tremors/muscle spasms with metabolic derangements.  Patient transferred to main campus for neurology evaluation, EEG ultimately unremarkable for seizures-her movement was considered to be musculoskeletal, somewhat choreiform in nature.  Assessment & Plan:   Principal Problem:   Severe sepsis ([place]) Active Problems:   Acute cystitis without hematuria   Current chronic use of systemic steroids   Chronic pain   Secondary adrenal insufficiency (HCC)   AKI (acute kidney injury)  ([place])   Granulomatosis with polyangiitis (HCC)   GERD (gastroesophageal reflux disease)   Essential hypertension   Obesity (BMI 30-39.9)   COVID-19 virus infection   Acute metabolic encephalopathy   Pulmonary embolism (HCC)   Transaminitis   Rhabdomyolysis  Acute metabolic encephalopathy, POA, worsening -Initially presumed to be secondary to infection and renal failure hyponatremia  -Potential covid encephalitis, however have never seen spastic choreiform movements with COVID encephalitis -Stabilizing overnight - possibly minimally improving -Discontinue antibiotics as below, monitor for CNS depressants -Discussed case with rheumatology, as below we will follow along for possible imaging to rule out CNS vasculitis presentation of granulomatosis/polyangiitis -MRI pending -Neuro consulted, EEG unremarkable for seizure -we discussed potential need for LP pending MRI -Sideline discussion with ID, will discontinue antibiotics given patient has completed presumed therapy for pneumonia versus UTI which was suspected on intake. -Nephrology following along, appreciate insight recommendations, they have sent off ANCA, ANA, anti-dsDNA, cryos, c3, c4, and spep/sflc, hepatitis, HIV, CMV, EBV -Previously discontinued medications include gabapentin, cefepime, Nucynta -Appreciate neurology insight and recommendations in this difficult case  Granulomatosis with polyangiitis [facility] -Patient receiving rituximab and methotrexate as an outpatient(most recently at [facility] in May 2023) - confirmed with rheumatology at [place] prednisone as below -Patient has had somewhat splintered medical care in the setting of rheumatology due to her complex disease state, plan to follow-up at [facility] but has been too unstable require multiple hospital admissions and has not yet been able to follow-up at this facility. -Discussed case with Dr. Benjamine Mola rheumatology at [facility], pending imaging of the brain to confirm  potential CNS vasculitis he would be willing to accept the patient to their service at [facility] for closer monitoring and to further discuss more novel/off label treatment as indicated (cyclophosphamide, avacopan) -patient's husband  would prefer transfer to [facility] if possible  AKI (acute kidney injury) (HCC) -Acute renal failure with a creatinine of 1.73 at time of admission. -Complicated and multifactorial etiology -possibly ATN per discussion with nephrology -Renal ultrasound demonstrating no obstructive uropathy -Nephrology following -Interestingly patient has had increased urine output, greater than 2 L output over the past 24 hours but creatinine continues to worsen current creatinine 7.6 -Potential placement for temporary HD catheter and dialysis later this week if renal function continues to decline.  Presumed severe sepsis secondary to pneumonia versus UTI, resolved (HCC) -Presumed at intake in the setting of leukocytosis tachycardia and low-grade temperature -Unfortunately procalcitonin can be falsely positive in vasculitis/renal dysfunction -Patient was presumed to have a UTI versus pneumonia given her history, has now completed 5 days of antibiotics we have discussed this case with infectious disease, given her prolonged history of antibiotics and no clear source at this time we will discontinue antibiotics and follow clinically for fever/source. -Patient no longer on aggressive IV fluids per nephrology -Cultures remain negative -sepsis physiology resolved previously   Acute cystitis without hematuria, resolved -Antibiotics discontinued, discussed case with ID, yeast in urine likely contaminant in the setting of prolonged antibiotics and colonization   Acute hypoxic respiratory failure, POA -Multifactorial in the setting of metabolic encephalopathy, presumed pneumonia, granulomatosis/polyangiitis, COVID-19 infection -Potentially exacerbated by hypervolemia  Secondary adrenal  insufficiency (HCC) -Continue steroids as above   Chronic pain -Discontinued home Neurontin and Nucynta given mental status changes  Current chronic use of systemic steroids -5 mg of prednisone on daily basis at baseline -Solu-Cortef x1 given while in the ED at time of admission. -Continue higher dose of prednisone 20 mg bid -may decrease steroid regimen in the next 24 to 48 hours to rule out steroid-induced psychosis given mental status changes as above  GERD (gastroesophageal reflux disease) -Continue PPI.   Essential hypertension -Currently well controlled off home regimen, continue to follow clinically  Rhabdomyolysis, improving -Secondary to prolonged downtime -CPK downtrending appropriately in the setting of aggressive IV fluids, renal function continues to deteriorate however as above -Defer to nephrology for ongoing fluids and diuretics   Elevated AST/ALT, resolving -Most likely in the setting of sepsis/shock liver and rhabdomyolysis. -Downtrending appropriately   Pulmonary embolism (HCC) -Somewhat new diagnosis, diagnosed during previous hospitalization 03/27/2022  -Continue Eliquis   COVID-19 virus infection - COVID PCR incidentally positive at time of admission -without respiratory symptoms - Inflammatory markers suggesting bacterial infection and very likely component of underlying rheumatologic condition. -Procalcitonin elevated, outlined as above this can be falsely positive and vasculitis in ESRD patients -Steroids ongoing as above -Remdesivir withheld due to elevated liver enzymes   Obesity (BMI 30-39.9) -Class I obesity -Body mass index is 33.38 kg/m.   Chronic opioid dependence (HCC) - Patient has intrathecal pump with bupivacaine and ziconotide,  -Last refill 03/02/2022 - Patient receives Nucynta 75 mg, #120; last refill 03/17/2022 - Patient is also on oxycodone 10 mg; #30, last refilled 02/23/2022 - Narcotics weaned down(eventually stopped) given worsening  mental status - Avoid CNS depressants   Chemotherapy-induced peripheral neuropathy (HCC) At home--is on gabapentin, Cymbalta, amitriptyline topiramate, trazodone, nucynta--all have been held given worsening mental status   DVT prophylaxis: lovenox (last eliquis dose 7/2 am) Code Status: Full Family Communication: Husband updated over the phone  Status is: Inpatient  Dispo: The patient is from: Home              Anticipated d/c is to: To be determined  Anticipated d/c date is: To be determined              Patient currently not medically stable for discharge  Consultants:  Nephro, neuro  Procedures:  None  Antimicrobials:  Discontinued 04/22/2022  Subjective: No acute issues or events overnight, mental status stabilizing, review of systems limited  Objective: Vitals:   04/22/22 1800 04/22/22 2041 04/23/22 0028 04/23/22 0412  BP: 129/71 129/86 (!) 142/89 (!) 114/94  Pulse:   87   Resp: 20 20 18 20   Temp:  98.6 F (37 C) 98.3 F (36.8 C) 97.7 F (36.5 C)  TempSrc:  Oral Oral Oral  SpO2: 99% 99% 100% 96%  Weight:      Height:        Intake/Output Summary (Last 24 hours) at 04/23/2022 0738 Last data filed at 04/23/2022 [id number] Gross per 24 hour  Intake 480 ml  Output 2500 ml  Net -2020 ml    Filed Weights   04/18/22 0500 04/19/22 0500 04/22/22 0230  Weight: 91 kg 91 kg 111 kg    Examination:  General:  Pleasantly resting in bed, No acute distress. HEENT:  Normocephalic atraumatic.  Sclerae nonicteric, noninjected.  Extraocular movements intact bilaterally. Neck:  Without mass or deformity.  Trachea is midline. Lungs: Diminished bilaterally with scant inspiratory and expiratory wheezes secondary to upper airway anatomy. Heart:  Regular rate and rhythm.  Without murmurs, rubs, or gallops. Abdomen:  Soft, nontender, nondistended.  Without guarding or rebound. Neuro: Patient unable to respond appropriately or follow simple commands; tracks  movement/sound Vascular:  Dorsalis pedis and posterior tibial pulses palpable bilaterally. Skin:  Warm and dry, no erythema, bilateral foot postoperative changes from previous amputations.  Data Reviewed: I have personally reviewed following labs and imaging studies  CBC: Recent Labs  Lab 04/17/22 1124 04/18/22 0326 04/20/22 0311 04/22/22 0336  WBC 24.5* 19.1* 12.4* 10.9*  NEUTROABS 18.4*  --  10.5*  --   HGB 11.2* 10.1* 8.2* 8.1*  HCT 38.1 34.3* 25.6* 25.3*  MCV 90.5 88.9 82.8 80.3  PLT 286 271 245 [id number]    Basic Metabolic Panel: Recent Labs  Lab 04/18/22 0326 04/19/22 0333 04/20/22 0311 04/21/22 0522 04/21/22 1242 04/22/22 0336  NA 134* 129* 127*  128* 126*  --  124*  K 4.8 5.4* 4.9  4.9 5.1  --  4.9  CL 106 102 94*  96* 93*  --  90*  CO2 18* 15* 23  22 22   --  17*  GLUCOSE 252* 187* 156*  155* 143* 108* 148*  BUN 27* 43* 51*  52* 61*  --  67*  CREATININE 2.24* 3.68*  3.57* 4.37*  4.36* 5.36*  5.38*  --  6.23*  CALCIUM 7.8* 7.1* 6.6*  6.7* 6.5*  --  6.2*  PHOS  --  4.7* 5.4* 6.2*  --  7.7*    GFR: Estimated Creatinine Clearance: 12.5 mL/min (A) (by C-G formula based on SCr of 6.23 mg/dL (H)). Liver Function Tests: Recent Labs  Lab 04/17/22 1124 04/18/22 0326 04/19/22 0333 04/20/22 0311 04/21/22 0522 04/22/22 0336 04/22/22 2031  AST 202* 933*  --  438*  --  162* 122*  ALT 54* 202*  --  203*  --  140* 120*  ALKPHOS 103 76  --  80  --  69 73  BILITOT 0.7 0.5  --  0.6  --  0.9 1.0  PROT 5.9* 4.7*  --  4.5*  --  4.6* 4.6*  ALBUMIN 2.9* 2.3*  2.0* 1.9*  1.9* 2.0* 1.8*  1.8* 1.8*    No results for input(s): "LIPASE", "AMYLASE" in the last 168 hours. Recent Labs  Lab 04/20/22 1411  AMMONIA 14    Coagulation Profile: Recent Labs  Lab 04/17/22 1124  INR 1.8*    Cardiac Enzymes: Recent Labs  Lab 04/18/22 1300 04/20/22 0311 04/21/22 0522 04/22/22 0336  CKTOTAL 26,433* 13,704* 6,333* 4,496*    BNP (last 3 results) No results for  input(s): "PROBNP" in the last 8760 hours. HbA1C: No results for input(s): "HGBA1C" in the last 72 hours. CBG: Recent Labs  Lab 04/22/22 0804 04/22/22 1155 04/22/22 1550 04/22/22 2127 04/23/22 0611  GLUCAP 108* 175* 192* 184* 119*    Lipid Profile: No results for input(s): "CHOL", "HDL", "LDLCALC", "TRIG", "CHOLHDL", "LDLDIRECT" in the last 72 hours. Thyroid Function Tests: Recent Labs    04/20/22 1411  FREET4 0.56*    Anemia Panel: Recent Labs    04/20/22 1412  VITAMINB12 909    Sepsis Labs: Recent Labs  Lab 04/17/22 1125 04/17/22 1248 04/17/22 1756 04/17/22 2028 04/18/22 0326 04/19/22 0333 04/20/22 0311  PROCALCITON  --   --   --   --  4.45 4.17 2.95  LATICACIDVEN 4.7* 4.0* 3.9* 3.9*  --   --   --      Recent Results (from the past 240 hour(s))  Culture, blood (Routine x 2)     Status: None   Collection Time: 04/17/22 11:25 AM   Specimen: Left Antecubital; Blood  Result Value Ref Range Status   Specimen Description LEFT ANTECUBITAL  Final   Special Requests   Final    BOTTLES DRAWN AEROBIC AND ANAEROBIC Blood Culture results may not be optimal due to an inadequate volume of blood received in culture bottles   Culture   Final    NO GROWTH 5 DAYS Performed at [facility], [address], [place], [place] [address]    Report Status 04/22/2022 FINAL  Final  Culture, blood (Routine x 2)     Status: None   Collection Time: 04/17/22 11:34 AM   Specimen: BLOOD LEFT ARM  Result Value Ref Range Status   Specimen Description BLOOD LEFT ARM  Final   Special Requests   Final    BOTTLES DRAWN AEROBIC AND ANAEROBIC Blood Culture adequate volume   Culture   Final    NO GROWTH 5 DAYS Performed at [facility], [address] [place], [place] [address]    Report Status 04/22/2022 FINAL  Final  Urine Culture     Status: Abnormal   Collection Time: 04/17/22 11:53 AM   Specimen: Urine, Catheterized  Result Value Ref Range Status   Specimen Description   Final     URINE, CATHETERIZED Performed at [facility], [address] [place], [place] [address]    Special Requests   Final    NONE Performed at [facility], [address] [place], [place] [address]    Culture >=100,000 COLONIES/mL YEAST (A)  Final   Report Status 04/19/2022 FINAL  Final  Resp Panel by RT-PCR (Flu A&B, Covid) Anterior Nasal Swab     Status: Abnormal   Collection Time: 04/17/22 11:57 AM   Specimen: Anterior Nasal Swab  Result Value Ref Range Status   SARS Coronavirus 2 by RT PCR POSITIVE (A) NEGATIVE Final    Comment: (NOTE) SARS-CoV-2 target nucleic acids are DETECTED.  The SARS-CoV-2 RNA is generally detectable in upper respiratory specimens during the acute phase of infection. Positive  results are indicative of the presence of the identified virus, but do not rule out bacterial infection or co-infection with other pathogens not detected by the test. Clinical correlation with patient history and other diagnostic information is necessary to determine patient infection status. The expected result is Negative.  Fact Sheet for Patients: EntrepreneurPulse.com.au  Fact Sheet for Healthcare Providers: IncredibleEmployment.be  This test is not yet approved or cleared by the [place] FDA and  has been authorized for detection and/or diagnosis of SARS-CoV-2 by FDA under an Emergency Use Authorization (EUA).  This EUA will remain in effect (meaning this test can be used) for the duration of  the COVID-19 declaration under Section 564(b)(1) of the A ct, 21 U.S.C. section 360bbb-3(b)(1), unless the authorization is terminated or revoked sooner.     Influenza A by PCR NEGATIVE NEGATIVE Final   Influenza B by PCR NEGATIVE NEGATIVE Final    Comment: (NOTE) The Xpert Xpress SARS-CoV-2/FLU/RSV plus assay is intended as an aid in the diagnosis of influenza from Nasopharyngeal swab specimens and should not be used as a sole basis for  treatment. Nasal washings and aspirates are unacceptable for Xpert Xpress SARS-CoV-2/FLU/RSV testing.  Fact Sheet for Patients: EntrepreneurPulse.com.au  Fact Sheet for Healthcare Providers: IncredibleEmployment.be  This test is not yet approved or cleared by the [place] FDA and has been authorized for detection and/or diagnosis of SARS-CoV-2 by FDA under an Emergency Use Authorization (EUA). This EUA will remain in effect (meaning this test can be used) for the duration of the COVID-19 declaration under Section 564(b)(1) of the Act, 21 U.S.C. section 360bbb-3(b)(1), unless the authorization is terminated or revoked.  Performed at [facility], [address] [place], [place] [address]     Radiology Studies: No results found.  Scheduled Meds:  apixaban  5 mg Oral BID   Chlorhexidine Gluconate Cloth  6 each Topical Daily   DULoxetine  60 mg Oral BID   folic acid  2 mg Oral Daily   guaiFENesin-dextromethorphan  10 mL Oral Q12H   insulin aspart  0-9 Units Subcutaneous TID WC   insulin glargine-yfgn  4 Units Subcutaneous QHS   linaclotide  145 mcg Oral QAC breakfast   pantoprazole  40 mg Oral Daily   predniSONE  20 mg Oral BID WC   senna  2 tablet Oral BID   vitamin B-12  1,000 mcg Oral Daily   Continuous Infusions:  furosemide 120 mg (04/22/22 2047)    LOS: 6 days   Time spent: 51min  Modine Oppenheimer C Carloyn Lahue, DO [facility]  If 7PM-7AM, please contact night-coverage [email or web address]  04/23/2022, 7:38 AM

## 2022-04-23 NOTE — Progress Notes (Signed)
ANTICOAGULATION CONSULT NOTE - Initial Consult  Pharmacy Consult for Apixaban >> Enoxaparin  Indication: pulmonary embolus  Allergies  Allergen Reactions   Ibuprofen Other (See Comments)    Pt is unable to take this due to kidney problems.      Penicillins Rash and Other (See Comments)    Has patient had a PCN reaction causing immediate rash, facial/tongue/throat swelling, SOB or lightheadedness with hypotension: No Has patient had a PCN reaction causing severe rash involving mucus membranes or skin necrosis: No Has patient had a PCN reaction that required hospitalization No Has patient had a PCN reaction occurring within the last 10 years: No If all of the above answers are "NO", then may proceed with Cephalosporin use.    Patient Measurements: Height: 5\' 5"  (165.1 cm) Weight: 111 kg (244 lb 11.4 oz) IBW/kg (Calculated) : 57  Vital Signs: Temp: 98.7 F (37.1 C) (07/02 1158) Temp Source: Oral (07/02 1158) BP: 136/85 (07/02 1158) Pulse Rate: 87 (07/02 1158)  Labs: Recent Labs    04/21/22 0522 04/22/22 0336 04/23/22 1015  HGB  --  8.1*  --   HCT  --  25.3*  --   PLT  --  271  --   CREATININE 5.36*  5.38* 6.23* 7.59*  CKTOTAL 6,333* 4,496* 4,061*    Estimated Creatinine Clearance: 10.3 mL/min (A) (by C-G formula based on SCr of 7.59 mg/dL (H)).   Medical History: Past Medical History:  Diagnosis Date   Chronic cough    Chronic pain    Diabetes mellitus without complication (HCC)    Dyspnea    GERD (gastroesophageal reflux disease)    Hypokalemia    Hypothyroidism    Left wrist pain 06/01/2021   Myonecrosis (HCC) 07/01/2021   Neurocardiogenic syncope    OSA (obstructive sleep apnea)    does not use CPAP   Osteomyelitis (HCC)    bilateral feet   Peripheral neuropathy    Presence of intrathecal pump    recieves Prialt/bupivicaine   Tear of gluteus medius tendon 07/01/2021   Wegener's granulomatosis 2009   Wegner's disease (congenital syphilitic  osteochondritis)     Medications:  Scheduled:   Chlorhexidine Gluconate Cloth  6 each Topical Daily   DULoxetine  60 mg Oral BID   folic acid  2 mg Oral Daily   guaiFENesin-dextromethorphan  10 mL Oral Q12H   insulin aspart  0-9 Units Subcutaneous TID WC   insulin glargine-yfgn  4 Units Subcutaneous QHS   linaclotide  145 mcg Oral QAC breakfast   pantoprazole  40 mg Oral Daily   predniSONE  20 mg Oral BID WC   senna  2 tablet Oral BID   vitamin B-12  1,000 mcg Oral Daily    Assessment: 56 yo F presenting with AMS/sepsis, on apixaban PTA for recent PE (03/27/2022). Last apixaban dose on 7/2 AM ~ 0800. Discussed with MD (Dr. 9/2) - will transition to enoxaparin (1 mg/kg SQ q24h). Hgb 8.1, plt 271. No signs/symptoms of bleeding reported.   Goal of Therapy:  Monitor platelets by anticoagulation protocol: Yes   Plan:  Enoxaparin 110 mg (~1 mg/kg) SQ q24h Consider anti-Xa monitoring given worsening renal function.  Monitor CBC and for signs/symptoms of bleeding.   Natale Milch, PharmD PGY-2 Pharmacy Resident Phone [phone] 04/23/2022 2:27 PM   Please check AMION for all [facility] phone numbers After 10:00 PM, call Main Pharmacy [phone]

## 2022-04-23 NOTE — Consult Note (Addendum)
Neurology Consultation  Reason for Consult: AMS  Referring Physician: Dr. Natale Milch   CC: AMS, SOB, urinary frequency  History is obtained from: Husband and medical record   HPI: [name] is a 56 y.o. female with past medical history of  Wegener's angiitis, chronic opioid dependency, hyperlipidemia, hypertension, impaired glucose tolerance, adrenal insufficiency on chronic prednisone usage, rheumatoid arthritis and multiple admissions secondary to sepsis in the setting of pneumonia and UTI.   From HPI: "Presented to the hospital with complaints of intermittent altered mental status changes at home and increased weakness.  Symptoms have been present for the last 2-3 days and worsening according to patient's husband. Patient has just completed her antibiotic therapy from most recent admission about 1 week prior to presentation. Per patient's husband he went to work and when he contacted patient she did not pick up the phone; on his arrival to home he found patient on the floor confused and unable to stand up.  Unclear if she tripped and fell, fell off her feet from ongoing weakness and deconditioning or ended passing out"  Husband is at bedside, and states she has never returned to baseline since hospitalization in January. He also states she has not had immunosuppressants in months due to multiple hospitalizations. He requests to be transferred to [facility] or [facility]   On exam, she is restless, moving all extremities, eyes are closed. She opens eyes to voice. She stated her name when asked, however delayed, did not answer any other orientation questions or speak. She intermittently will follow simple commands, she showed me a thumb and stuck her tongue out. She does track with blink to threat bilaterally. She did have 3-4 myoclonic jerks of upper and lower extremities during exam when stimulated. No neck stiffness or meningismus signs noted during exam  ROS:  Unable to obtain due to altered mental  status.   Past Medical History:  Diagnosis Date   Chronic cough    Chronic pain    Diabetes mellitus without complication (HCC)    Dyspnea    GERD (gastroesophageal reflux disease)    Hypokalemia    Hypothyroidism    Left wrist pain 06/01/2021   Myonecrosis (HCC) 07/01/2021   Neurocardiogenic syncope    OSA (obstructive sleep apnea)    does not use CPAP   Osteomyelitis (HCC)    bilateral feet   Peripheral neuropathy    Presence of intrathecal pump    recieves Prialt/bupivicaine   Tear of gluteus medius tendon 07/01/2021   Wegener's granulomatosis 2009   Wegner's disease (congenital syphilitic osteochondritis)      Family History  Problem Relation Age of Onset   Diabetes Mother    Heart disease Mother    Diabetes Father      Social History:   reports that she has never smoked. She has never used smokeless tobacco. She reports current alcohol use. She reports that she does not use drugs.  Medications  Current Facility-Administered Medications:    acetaminophen (TYLENOL) tablet 650 mg, 650 mg, Oral, Q6H PRN, Tat, David, MD, 650 mg at 04/22/22 1238   albuterol (PROVENTIL) (2.5 MG/3ML) 0.083% nebulizer solution 2.5 mg, 2.5 mg, Nebulization, BID PRN, Azucena Fallen, MD   Chlorhexidine Gluconate Cloth 2 % PADS 6 each, 6 each, Topical, Daily, Tat, David, MD, 6 each at 04/23/22 0809   DULoxetine (CYMBALTA) DR capsule 60 mg, 60 mg, Oral, BID, Tat, David, MD, 60 mg at 04/23/22 0809   enoxaparin (LOVENOX) injection 110 mg, 110 mg, Subcutaneous,  Q24H, Azucena Fallen, MD   folic acid (FOLVITE) tablet 2 mg, 2 mg, Oral, Daily, Tat, David, MD, 2 mg at 04/23/22 0809   guaiFENesin-dextromethorphan (ROBITUSSIN DM) 100-10 MG/5ML syrup 10 mL, 10 mL, Oral, Q12H, Tat, David, MD, 10 mL at 04/23/22 0809   insulin aspart (novoLOG) injection 0-9 Units, 0-9 Units, Subcutaneous, TID WC, Tat, Onalee Hua, MD, 2 Units at 04/22/22 1700   insulin glargine-yfgn (SEMGLEE) injection 4 Units, 4 Units,  Subcutaneous, QHS, Tat, Onalee Hua, MD, 4 Units at 04/22/22 2230   lidocaine (LIDODERM) 5 % 1 patch, 1 patch, Transdermal, Daily PRN, Tat, Onalee Hua, MD, 1 patch at 04/17/22 2123   linaclotide (LINZESS) capsule 145 mcg, 145 mcg, Oral, QAC breakfast, Tat, Onalee Hua, MD, 145 mcg at 04/23/22 7425   methocarbamol (ROBAXIN) tablet 500 mg, 500 mg, Oral, Q8H PRN, Tat, Onalee Hua, MD, 500 mg at 04/20/22 2209   ondansetron (ZOFRAN) tablet 4 mg, 4 mg, Oral, Q6H PRN **OR** ondansetron (ZOFRAN) injection 4 mg, 4 mg, Intravenous, Q6H PRN, Tat, David, MD   pantoprazole (PROTONIX) EC tablet 40 mg, 40 mg, Oral, Daily, Tat, David, MD, 40 mg at 04/23/22 0809   predniSONE (DELTASONE) tablet 20 mg, 20 mg, Oral, BID WC, Tat, Onalee Hua, MD, 20 mg at 04/23/22 0809   senna (SENOKOT) tablet 17.2 mg, 2 tablet, Oral, BID, Tat, Onalee Hua, MD, 17.2 mg at 04/21/22 9563   vitamin B-12 (CYANOCOBALAMIN) tablet 1,000 mcg, 1,000 mcg, Oral, Daily, Tat, David, MD, 1,000 mcg at 04/23/22 8756   Exam: Current vital signs: BP 136/85 (BP Location: Left Arm)   Pulse 87   Temp 98.7 F (37.1 C) (Oral)   Resp 20   Ht 5\' 5"  (1.651 m)   Wt 111 kg   SpO2 98%   BMI 40.72 kg/m  Vital signs in last 24 hours: Temp:  [97.7 F (36.5 C)-98.7 F (37.1 C)] 98.7 F (37.1 C) (07/02 1158) Pulse Rate:  [83-87] 87 (07/02 1158) Resp:  [18-20] 20 (07/02 0820) BP: (114-147)/(85-94) 136/85 (07/02 1158) SpO2:  [94 %-100 %] 98 % (07/02 1158)  GENERAL: Awake, confused-appearing female laying in bed in NAD HEENT:  Normocephalic and atraumatic, dry mm, no neck stiffness or meningismus noted  LUNGS - bilaterally wheezes CV - S1S2 RRR, no m/r/g, equal pulses bilaterally. ABDOMEN - Soft, nontender, nondistended with normoactive BS Ext: cool, multiple bruises in varying stages, missing all toes on both feet, well perfused, intact peripheral pulses, no edema  NEURO:  Ment: She is restless, moving all extremities. Initially eyes are closed; she opens eyes to voice. She states  her name when asked. Otherwise, does not answer any other orientation questions or verbalize spontaneously. She intermittently will follow simple commands. She shows her thumb and sticks out her tongue to command.  CN: She does track and blinks to threat bilaterally, PERRL. Face symmetric. Head is midline.  Motor: Elevates upper extremities antigravity and with poor effort attempts to resist examiner. She has continuous prominent asterixis of BUE with all movements, worse when holding antigravity. There is a positive myoclonus component in addition to the more prominent negative myoclonus. Also with action myoclonus of BLE, which she does not elevate antigravity to command. No asymmetry noted.   Sensory: Reacts to touch x 4 without asymmetry.  Reflexes: Difficult to elicit in the context of prominent asterixis Cerebellar: Does not follow commands for testing.  Gait: Unable to assess Other: Neck is supple. Skin not flushed, diaphoretic or abnormally warm to touch. No neck stiffness or other signs of  meningismus are noted.  LABS:  Na 126 BUN 83 Cr 7.59 Phos 9.3 Ca 6.5 CK 4061 WBC 10.9 HGB 8.1 Plt 271 AST 122 ALT 120 ANCA, ANA, anti-dsDNA, cryos, c3, c4, and spep/sflc, hepatitis, HIV, CMV, EBV- pending Ammonia 14 on 6/29 TSH 1.657 Vit B12 909  Imaging I have reviewed the images obtained:  CT-head 6/26: No acute abnormality. 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 Degenerative disc and facet disease changes of the cervical spine. No acute cervical spine abnormalities.  MRI examination of the brain 7/2: 1. No acute intracranial abnormality. 2. 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 3. Moderate diffuse white matter disease likely reflects the sequela of chronic microvascular ischemia. 4. Left mastoid effusion. No obstructing nasopharyngeal lesion is present.  rEEG 6/29: This study is  suggestive of moderate diffuse encephalopathy, nonspecific to etiology. No seizures or epileptiform discharges were seen throughout the recording. Patient was noted to have brief intermittent whole body twitching as well as asynchronous and non- rhythmic twitching of arms and legs without concomitant EEG change. These episodes were NOT epileptic.   Assessment: 56 y.o. female with a past medical history of  Wegener's angiitis, chronic opioid dependency, hyperlipidemia, hypertension, impaired glucose tolerance, adrenal insufficiency on chronic prednisone usage, rheumatoid arthritis and multiple admissions secondary to sepsis in the setting of pneumonia and UTI.  - Acute encephalopathy is most likely multifactorial due to metabolic derangements, AKI, COVID 19 infection, sepsis/infection, Elevated liver enzymes - The prominent asterixis on exam is most likely secondary to uremic encephalopathy given lab values. A component of hepatic encephalopathy is also possible.  - No meningismus or fever to suggest a meningitis. WBC ws 24.5 on 6/26, but has trended down with most recent WBC 10.9 on Saturday   Recommendations: - Continue to correct metabolic derangements per primary team  - Continue to treat infections - Limit sedating medications - Delirium precautions - Consider LP to r/o encephalitis/meningitis if does AMS does not improve with correction of metabolic derangements and infection. She has an intrathecal pump. If LP is determined to be necessary, then it will need to be obtained under fluoro - Neurology will continue to follow  Gevena Mart DNP, ACNPC-AG  I have seen and examined the patient. I have formulated the assessment and recommendations. 56 year old female presenting with prominent asterixis and AMS. 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 EEG shows no electrographic changes to correlate with the asterixis seen during the recording; the episodes of abnormal  movements are not epileptic. Recommendations as above.  Electronically signed: Dr. Caryl Pina

## 2022-04-23 NOTE — Progress Notes (Addendum)
Nephrology Follow-Up Consult note   Assessment/Recommendations: [name] is a/an 56 y.o. female with a past medical history significant for GPA, HLD, HTN, admitted for AMS c/b AKI and COVID.       AKI: nonoliguric. Unlikely related to ANCA as Crt was normal very recently plus she received Ritux in May of this year and has been on steroids. UA mainly with infection and minimal hematuria. Most likely has had ATN. She made 2.5L of urine yesterday with IV lasix. Unfortunately her Crt continues to rise to 7.6 today despite increased UOP (although IV lasix was given) -Given crt rising I will have IR see her and plan for possibly temporary dialysis catheter tomorrow. Will have them check with [place] in the morning before bringing her down just in case Crt improves. -Possible HD tomorrow -Hold lasix today -Continue to monitor daily Cr, Dose meds for GFR -Monitor Daily I/Os, Daily weight  -Maintain MAP>65 for optimal renal perfusion.  -Avoid nephrotoxic medications including NSAIDs -Use synthetic opioids (Fentanyl/Dilaudid) if needed  Hyponatremia: Sodium down to 124 yesterday.  Likely related to volume excess in the setting of AKI.  Urine sodium likely high related to ATN.  Unreliable at this time.  Received 120 mg IV Lasix twice yesterday.  Hold today.  AMS: Transferred here for further evaluation.  The cause is unclear.  Cerebral vasculitis has been considered.  I think it is unlikely given the patient recently received rituximab in May 2023 and has been on steroids but it is possible.  Infectious causes are also a major concern. -I would recommend obtaining an MRI today -I think a lumbar puncture and neurology involvement would also be prudent -Intracranial vasculitis can be very hard to diagnose and infections can often be misleading cause.  This is important as her recent immunosuppressive therapy predisposes her to numerous infectious.  Therefore I think lumbar puncture is prudent.  Possible ID  consult would be reasonable after further imaging were obtained (may want to get ID's input about what studies from LP should be sent; MRI may influence decisions) -I will send ANCA, ANA, anti-dsDNA, cryos, c3, c4, and spep/sflc, hepatitis, HIV, CMV, EBV -assays for treponema pallidum, borrelia, bartonella, TB, VZV and others may be prudent -Covid can sometimes cause intracranial inflammation as well so this should be considered -CTA is being considered (suggested by rheum at [facility]) as it can be more sensitive for intracranial vasculitis than MRI but would do MRI and LP first then decide -Looking at transfer to [facility]. Would consider [facility] as well given they are a national vasculitis center  Metabolic acidosis: monitor on labs today.  Hypocalcemia: Calcium 6.2 corrects to 8. Unlikely low enough to cause symptoms but follow iCal and replete if needed  Sepsis: Source PNA or UTI. abx per primary team  GPA: follows with rheum receiving MTX possibly ritux in the past but unsure when. Would be rare for AMS to be from [facility] while receiving immunosuppression but can happen. Further work up as above  Rhabdo: CK trending down. Holding fluids given volume overload.  COVID: minimal symptoms. mgmt per primary   Recommendations conveyed to primary service.    Darnell Level  [facility] 04/23/2022 8:08 AM  ___________________________________________________________  CC: AKI, COVID, AMS  Interval History/Subjective: Patient somewhat more interactive today.  Does seem she is understanding more language.  Has a hard time communicating.   Medications:  Current Facility-Administered Medications  Medication Dose Route Frequency Provider Last Rate Last Admin   acetaminophen (TYLENOL)  tablet 650 mg  650 mg Oral Q6H PRN Tat, Onalee Hua, MD   650 mg at 04/22/22 1238   albuterol (PROVENTIL) (2.5 MG/3ML) 0.083% nebulizer solution 2.5 mg  2.5 mg Nebulization BID PRN Azucena Fallen, MD        apixaban Everlene Balls) tablet 5 mg  5 mg Oral BID Catarina Hartshorn, MD   5 mg at 04/22/22 4098   Chlorhexidine Gluconate Cloth 2 % PADS 6 each  6 each Topical Daily Tat, Onalee Hua, MD   6 each at 04/22/22 1191   DULoxetine (CYMBALTA) DR capsule 60 mg  60 mg Oral BID Catarina Hartshorn, MD   60 mg at 04/22/22 4782   folic acid (FOLVITE) tablet 2 mg  2 mg Oral Daily Tat, David, MD   2 mg at 04/22/22 0820   furosemide (LASIX) 120 mg in dextrose 5 % 50 mL IVPB  120 mg Intravenous Q12H Darnell Level, MD 62 mL/hr at 04/22/22 2047 120 mg at 04/22/22 2047   guaiFENesin-dextromethorphan (ROBITUSSIN DM) 100-10 MG/5ML syrup 10 mL  10 mL Oral Pablo Ledger, MD   10 mL at 04/22/22 0821   insulin aspart (novoLOG) injection 0-9 Units  0-9 Units Subcutaneous TID WC Catarina Hartshorn, MD   2 Units at 04/22/22 1700   insulin glargine-yfgn (SEMGLEE) injection 4 Units  4 Units Subcutaneous QHS Catarina Hartshorn, MD   4 Units at 04/22/22 2230   lidocaine (LIDODERM) 5 % 1 patch  1 patch Transdermal Daily PRN Catarina Hartshorn, MD   1 patch at 04/17/22 2123   linaclotide (LINZESS) capsule 145 mcg  145 mcg Oral QAC breakfast Tat, Onalee Hua, MD   145 mcg at 04/22/22 9562   methocarbamol (ROBAXIN) tablet 500 mg  500 mg Oral Q8H PRN Tat, Onalee Hua, MD   500 mg at 04/20/22 2209   ondansetron (ZOFRAN) tablet 4 mg  4 mg Oral Q6H PRN Tat, Onalee Hua, MD       Or   ondansetron (ZOFRAN) injection 4 mg  4 mg Intravenous Q6H PRN Tat, Onalee Hua, MD       pantoprazole (PROTONIX) EC tablet 40 mg  40 mg Oral Daily Tat, Onalee Hua, MD   40 mg at 04/22/22 1308   predniSONE (DELTASONE) tablet 20 mg  20 mg Oral BID WC Catarina Hartshorn, MD   20 mg at 04/22/22 1700   senna (SENOKOT) tablet 17.2 mg  2 tablet Oral BID Catarina Hartshorn, MD   17.2 mg at 04/21/22 6578   vitamin B-12 (CYANOCOBALAMIN) tablet 1,000 mcg  1,000 mcg Oral Daily Tat, Onalee Hua, MD   1,000 mcg at 04/22/22 0820      Review of Systems: Unable to obtain due to the patient's altered mental status  Physical Exam: Vitals:   04/23/22 0028  04/23/22 0412  BP: (!) 142/89 (!) 114/94  Pulse: 87   Resp: 18 20  Temp: 98.3 F (36.8 C) 97.7 F (36.5 C)  SpO2: 100% 96%   No intake/output data recorded.  Intake/Output Summary (Last 24 hours) at 04/23/2022 4696 Last data filed at 04/23/2022 2952 Gross per 24 hour  Intake 360 ml  Output 2500 ml  Net -2140 ml   Constitutional: Lying in bed, some twitching of eyes ENMT: ears and nose without scars or lesions, MMM CV: normal rate, 1+ edema to bilateral ankles Respiratory: Bilateral chest rise, normal work of breathing Gastrointestinal: soft, non-tender, no palpable masses or hernias Skin: no visible lesions or rashes Psych: Awake and alert, some interaction   Test Results I  personally reviewed new and old clinical labs and radiology tests Lab Results  Component Value Date   NA 124 (L) 04/22/2022   K 4.9 04/22/2022   CL 90 (L) 04/22/2022   CO2 17 (L) 04/22/2022   BUN 67 (H) 04/22/2022   CREATININE 6.23 (H) 04/22/2022   CALCIUM 6.2 (LL) 04/22/2022   ALBUMIN 1.8 (L) 04/22/2022   PHOS 7.7 (H) 04/22/2022    CBC Recent Labs  Lab 04/17/22 1124 04/18/22 0326 04/20/22 0311 04/22/22 0336  WBC 24.5* 19.1* 12.4* 10.9*  NEUTROABS 18.4*  --  10.5*  --   HGB 11.2* 10.1* 8.2* 8.1*  HCT 38.1 34.3* 25.6* 25.3*  MCV 90.5 88.9 82.8 80.3  PLT 286 271 245 271

## 2022-04-24 ENCOUNTER — Inpatient Hospital Stay (HOSPITAL_COMMUNITY): Payer: BC Managed Care – PPO

## 2022-04-24 DIAGNOSIS — R6520 Severe sepsis without septic shock: Secondary | ICD-10-CM | POA: Diagnosis not present

## 2022-04-24 DIAGNOSIS — A419 Sepsis, unspecified organism: Secondary | ICD-10-CM | POA: Diagnosis not present

## 2022-04-24 HISTORY — PX: IR FLUORO GUIDE CV LINE RIGHT: IMG2283

## 2022-04-24 HISTORY — PX: IR US GUIDE VASC ACCESS RIGHT: IMG2390

## 2022-04-24 LAB — CK: Total CK: 4049 U/L — ABNORMAL HIGH (ref 38–234)

## 2022-04-24 LAB — GASTROINTESTINAL PANEL BY PCR, STOOL (REPLACES STOOL CULTURE)

## 2022-04-24 LAB — C3 COMPLEMENT: C3 Complement: 134 mg/dL (ref 82–167)

## 2022-04-24 LAB — GLUCOSE, CAPILLARY
Glucose-Capillary: 87 mg/dL (ref 70–99)
Glucose-Capillary: 99 mg/dL (ref 70–99)
Glucose-Capillary: 131 mg/dL — ABNORMAL HIGH (ref 70–99)
Glucose-Capillary: 135 mg/dL — ABNORMAL HIGH (ref 70–99)

## 2022-04-24 LAB — CBC
HCT: 27.9 % — ABNORMAL LOW (ref 36.0–46.0)
Hemoglobin: 9 g/dL — ABNORMAL LOW (ref 12.0–15.0)
MCH: 26.2 pg (ref 26.0–34.0)
MCHC: 32.3 g/dL (ref 30.0–36.0)
MCV: 81.3 fL (ref 80.0–100.0)
Platelets: 271 10*3/uL (ref 150–400)
RBC: 3.43 MIL/uL — ABNORMAL LOW (ref 3.87–5.11)
RDW: 20.7 % — ABNORMAL HIGH (ref 11.5–15.5)
WBC: 13.5 10*3/uL — ABNORMAL HIGH (ref 4.0–10.5)
nRBC: 0 % (ref 0.0–0.2)

## 2022-04-24 LAB — KAPPA/LAMBDA LIGHT CHAINS
Kappa free light chain: 29.6 mg/L — ABNORMAL HIGH (ref 3.3–19.4)
Kappa, lambda light chain ratio: 1.62 (ref 0.26–1.65)
Lambda free light chains: 18.3 mg/L (ref 5.7–26.3)

## 2022-04-24 LAB — RENAL FUNCTION PANEL
Albumin: 1.9 g/dL — ABNORMAL LOW (ref 3.5–5.0)
Anion gap: 20 — ABNORMAL HIGH (ref 5–15)
BUN: 101 mg/dL — ABNORMAL HIGH (ref 6–20)
CO2: 17 mmol/L — ABNORMAL LOW (ref 22–32)
Calcium: 6.3 mg/dL — CL (ref 8.9–10.3)
Chloride: 90 mmol/L — ABNORMAL LOW (ref 98–111)
Creatinine, Ser: 8.35 mg/dL — ABNORMAL HIGH (ref 0.44–1.00)
GFR, Estimated: 5 mL/min — ABNORMAL LOW (ref >60)
Glucose, Bld: 81 mg/dL (ref 70–99)
Phosphorus: 11.2 mg/dL — ABNORMAL HIGH (ref 2.5–4.6)
Potassium: 5.1 mmol/L (ref 3.5–5.1)
Sodium: 127 mmol/L — ABNORMAL LOW (ref 135–145)

## 2022-04-24 LAB — C DIFFICILE (CDIFF) QUICK SCRN (NO PCR REFLEX)
C Diff antigen: POSITIVE — AB
C Diff toxin: NEGATIVE

## 2022-04-24 LAB — CALCIUM, IONIZED: Calcium, Ionized, Serum: 3.6 mg/dL — ABNORMAL LOW (ref 4.5–5.6)

## 2022-04-24 LAB — C4 COMPLEMENT: Complement C4, Body Fluid: 50 mg/dL — ABNORMAL HIGH (ref 12–38)

## 2022-04-24 MED ORDER — HEPARIN SODIUM (PORCINE) 1000 UNIT/ML IJ SOLN
INTRAMUSCULAR | Status: AC
  Filled 2022-04-24: qty 4

## 2022-04-24 MED ORDER — ANTICOAGULANT SODIUM CITRATE 4% (200MG/5ML) IV SOLN
5.0000 mL | Status: DC | PRN
  Filled 2022-04-24: qty 5

## 2022-04-24 MED ORDER — HEPARIN SODIUM (PORCINE) 1000 UNIT/ML IJ SOLN
INTRAMUSCULAR | Status: AC
  Filled 2022-04-24: qty 10

## 2022-04-24 MED ORDER — LIDOCAINE HCL 1 % IJ SOLN
INTRAMUSCULAR | Status: AC
  Filled 2022-04-24: qty 20

## 2022-04-24 MED ORDER — DULOXETINE HCL 30 MG PO CPEP
30.0000 mg | ORAL_CAPSULE | Freq: Every day | ORAL | Status: DC
  Administered 2022-04-25 – 2022-05-18 (×24): 30 mg via ORAL
  Filled 2022-04-24 (×25): qty 1

## 2022-04-24 MED ORDER — HEPARIN SODIUM (PORCINE) 1000 UNIT/ML DIALYSIS
1000.0000 [IU] | INTRAMUSCULAR | Status: DC | PRN
Start: 2022-04-24 — End: 2022-04-28
  Administered 2022-04-25: 2600 [IU]
  Administered 2022-04-26: 1000 [IU]
  Filled 2022-04-24 (×4): qty 1

## 2022-04-24 MED ORDER — ALTEPLASE 2 MG IJ SOLR: 2.0000 mg | Freq: Once | INTRAMUSCULAR | Status: DC | PRN

## 2022-04-24 NOTE — Procedures (Signed)
Interventional Radiology Procedure Note  Procedure: Right IJ 16 cm triple lumen temp HD catheter. Tips in the upper RA and ready for use.  Complications: None  Estimated Blood Loss: None  Recommendations: - Routine line care  Signed,  Sterling Big, MD

## 2022-04-24 NOTE — Progress Notes (Signed)
Speech Language Pathology Treatment: Dysphagia  Patient Details Name: [name] MRN: [id number] DOB: 09-03-1966 Today's Date: 04/24/2022 Time: 1205-1232 SLP Time Calculation (min) (ACUTE ONLY): 27 min  Assessment / Plan / Recommendation Clinical Impression  Pt seen for dyphagia f/u session with min-mod verbal cues provided during all PO intake with various consistencies including: honey-thickened liquids via straw, puree and soft solids with impaired mastication, delay in the initiation of the swallow, delayed cough (1-2x after all intake given) and impaired cognition/lethargy impacting overall swallowing function.  Recommend an objective assessment given recent PNA, hx of Wegener's, recent overt s/s of aspiration during PO intake and overall deconditioning.  Unfortunately, MBS was unable to be completed this date d/t pending initiation of HD conflicting with completion of MBS.  Continue Dysphagia 2/honey-thickened liquids via cup/straw with FULL supervision and assistance during PO intake if pt's mentation is adequate.  Discussed dysphagia and educated sister-in-law re: aspiration risk (as thin liquids were noted in room) and requirement of conservative diet with precautions recommended for safety purposes.  Nursing informed SLP pt's diet had been adjusted with pt receiving Regular/thin liquid tray, but nursing reinstated prior diet d/t hx of dysphagia to Dysphagia 2/honey-thickened liquids per SLP's recommendations from initial clinical swallowing evaluation completed on 04/21/22.  ST will continue to f/u in acute setting for completion of MBS/diet tolerance.   HPI HPI: 56 y.o. female with medical history significant of Wegener's angiitis, chronic opioid dependency, hyperlipidemia, hypertension, impaired glucose tolerance, adrenal insufficiency on chronic prednisone usage, rheumatoid arthritis and multiple admissions secondary to sepsis in the setting of pneumonia and UTI.  Presented to the hospital  [facility]) on 04/17/22 with complaints of intermittent altered mental status changes at home and increased weakness.  Symptoms have been present for the last 2-3 days and worsening according to patient's husband.  Patient has just completed her antibiotic therapy from most recent admission about 1 week prior to presentation.  In the ED patient met criteria for severe sepsis with elevated WBCs, elevated lactic acid in the 4.7 range, acute renal failure as organ dysfunction and was also tachycardic and tachypneic;Subsequently found to be Covid+; transferred to [facility] on 04/22/22; ST evaluation on 04/21/22 indicating need for Dysphagia 2/honey-thickened liquids: CXR on 04/17/22 indicated Progressive interstitial pattern left lung and right upper lobe.  This may represent edema or infection; ST f/u for potential objective assessment/diet tolerance.      SLP Plan  MBS;Continue with current plan of care      Recommendations for follow up therapy are one component of a multi-disciplinary discharge planning process, led by the attending physician.  Recommendations may be updated based on patient status, additional functional criteria and insurance authorization.    Recommendations  Diet recommendations: Dysphagia 2 (fine chop);Honey-thick liquid Liquids provided via: Cup;Straw Medication Administration: Whole meds with puree (as tolerated; or may crush depending on mentation) Supervision: Full supervision/cueing for compensatory strategies Compensations: Slow rate;Small sips/bites Postural Changes and/or Swallow Maneuvers: Seated upright 90 degrees                Oral Care Recommendations: Oral care BID;Staff/trained caregiver to provide oral care Follow Up Recommendations: Follow physician's recommendations for discharge plan and follow up therapies Assistance recommended at discharge: Frequent or constant Supervision/Assistance SLP Visit Diagnosis: Dysphagia, unspecified (R13.10) Plan:  MBS;Continue with current plan of care           Elvina Sidle, M.S., CCC-SLP  04/24/2022, 12:59 PM

## 2022-04-24 NOTE — Progress Notes (Signed)
[employer] rep came and decreased the infusion dose to the minimal rate on the patient's pain pump per Dr. Natale Milch. Per the rep, the patient will not experience any type of withdrawal symptoms.

## 2022-04-24 NOTE — Progress Notes (Signed)
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  Patient presents with worsening intermittent altered mental status and increased weakness.  She has been declining since approximately January, 6 months ago per husband.  She was recently admitted to our facility and treated for pneumonia with moderate improvement.  She was to continue on vancomycin through 04/08/2022 followed by infectious disease.  Bronchoscopy 03/30/2022 shows Candida in the setting of ongoing antibiotics, deemed to be colonized and not active infection given fungitel was reassuringly low.  Of note she is also had a recent diagnosis of pulmonary embolism on apixaban.  Patient admitted again with elevated white count lactic acid and acute renal failure in the setting of poor p.o. intake altered mental status tachycardia and tachypnea concerning for sepsis.  Patient's source was presumed to be pneumonia versus UTI placed initially on broad-spectrum antibiotics of vancomycin and cefepime.  Her sepsis criteria did not improve and her renal function continued to worsen in the setting of presumed sepsis and rhabdomyolysis.  Nephrology following with worsening mental status, tremors/muscle spasms with metabolic derangements.  Patient transferred to main campus for neurology evaluation, EEG ultimately unremarkable for seizures-her movement was considered to be musculoskeletal, somewhat choreiform in nature.  Assessment & Plan:   Principal Problem:   Severe sepsis ([place]) Active Problems:   Acute cystitis without hematuria   Current chronic use of systemic steroids   Chronic pain   Secondary adrenal insufficiency (HCC)   AKI (acute kidney injury)  ([place])   Granulomatosis with polyangiitis (HCC)   GERD (gastroesophageal reflux disease)   Essential hypertension   Obesity (BMI 30-39.9)   COVID-19 virus infection   Acute metabolic encephalopathy   Pulmonary embolism (HCC)   Transaminitis   Rhabdomyolysis  Acute metabolic encephalopathy, POA, improving -Initially presumed to be secondary to infection/renal failure/hyponatremia  -Potential covid encephalitis, however have never seen spastic choreiform movements with COVID encephalitis -Mental status appears to be minimally improving today -Discontinue antibiotics as below, monitor for CNS depressants -Discussed case with rheumatology, as below we will follow along for possible imaging to rule out CNS vasculitis presentation of granulomatosis/polyangiitis -MRI unremarkable for acute process -Neuro consulted, EEG unremarkable for seizure -we discussed potential need for LP pending MRI -Sideline discussion with ID, will discontinue antibiotics given patient has completed presumed therapy for pneumonia versus UTI which was suspected on intake. -Nephrology following along, appreciate insight recommendations, they have sent off ANCA, ANA, anti-dsDNA, cryos, c3, c4, and spep/sflc, hepatitis, HIV, CMV, EBV -Previously discontinued medications include gabapentin, cefepime, Nucynta -Appreciate neurology insight and recommendations in this difficult case  Granulomatosis with polyangiitis [facility]) -Patient receiving rituximab and methotrexate as an outpatient(most recently at [facility] in May 2023) - confirmed with rheumatology at [place] prednisone as below -Patient has had somewhat splintered medical care in the setting of rheumatology due to her complex disease state, plan to follow-up at [facility] but has been too unstable require multiple hospital admissions and has not yet been able to follow-up at this facility. -Discussed case with Dr. Benjamine Mola rheumatology at [facility], pending imaging of  the brain to confirm potential CNS vasculitis he would be willing to accept the patient to their service at [facility] for closer monitoring and to further discuss more novel/off label treatment as indicated -patient's husband  would prefer transfer to [facility] if possible  AKI (acute kidney injury) ([place]) - Acute renal failure with a creatinine of 1.73 at time of admission. - Complicated and multifactorial etiology -possibly ATN per discussion with nephrology - Nephrology following - IR to place temp HD catheter today with planned HD later today per Nephrology.  Acute hypoxic respiratory failure, POA -Multifactorial in the setting of metabolic encephalopathy, presumed pneumonia, granulomatosis/polyangiitis, COVID-19 infection -Potentially exacerbated by hypervolemia  Presumed severe sepsis secondary to pneumonia versus UTI, resolved ([place]) -Presumed at intake in the setting of leukocytosis tachycardia and low-grade temperature -Unfortunately procalcitonin can be falsely positive in vasculitis/renal dysfunction -Patient was presumed to have a UTI versus pneumonia given her history, has now completed 5 days of antibiotics we have discussed this case with infectious disease, given her prolonged history of antibiotics and no clear source at this time we will discontinue antibiotics and follow clinically for fever/source. -Patient no longer on aggressive IV fluids per nephrology -Cultures remain negative -sepsis physiology resolved previously   Acute cystitis without hematuria, resolved -Antibiotics discontinued, discussed case with ID, yeast in urine likely contaminant in the setting of prolonged antibiotics and colonization  Secondary adrenal insufficiency (HCC) -Continue steroids as above   Chronic pain -Discontinued home Neurontin and Nucynta given mental status changes  Current chronic use of systemic steroids -5 mg of prednisone on daily basis at baseline -Solu-Cortef x1 given while in the  ED at time of admission. -Continue higher dose of prednisone 20 mg bid -may decrease steroid regimen in the next 24 to 48 hours to rule out steroid-induced psychosis given mental status changes as above  GERD (gastroesophageal reflux disease) -Continue PPI.   Essential hypertension -Currently well controlled off home regimen, continue to follow clinically  Rhabdomyolysis, improving -Secondary to prolonged downtime -CPK downtrending appropriately in the setting of aggressive IV fluids, renal function continues to deteriorate however as above -Defer to nephrology for ongoing fluids and diuretics   Elevated AST/ALT, resolving -Most likely in the setting of sepsis/shock liver and rhabdomyolysis. -Downtrending appropriately   Pulmonary embolism ([place]) -Somewhat new diagnosis, diagnosed during previous hospitalization 03/27/2022  -Transition from eliquis to lovenox (for possible procedure/LP/HD line placement)  COVID-19 virus infection - COVID PCR incidentally positive at time of admission -without respiratory symptoms - Inflammatory markers suggesting bacterial infection and very likely component of underlying rheumatologic condition. - Procalcitonin elevated, outlined as above this can be falsely positive and vasculitis in ESRD patients - Steroids ongoing as above - Remdesivir withheld due to elevated liver enzymes - Will complete quarantine 04/27/22   Obesity (BMI 30-39.9) -Class I obesity -Body mass index is 33.38 kg/m.   Chronic opioid dependence ([place]) - Patient has intrathecal pump with bupivacaine and ziconotide - Last refill 03/02/2022 - unclear if this is currently running. - Patient receives Nucynta 75 mg, #120; last refill 03/17/2022 - Patient is also on oxycodone 10 mg; #30, last refilled 02/23/2022 - Narcotics weaned down(eventually stopped) given worsening mental status - Avoid CNS depressants   Cdiff, presumed colonized, likely POA - Transient episodes of diarrhea - GI  panel unremarkable - Cdiff Ag positive, toxin negative - no indication for treatment as symptoms resolved.  Chemotherapy-induced peripheral neuropathy (HCC) At home--is on gabapentin, Cymbalta, amitriptyline topiramate, trazodone, nucynta--all have been held given worsening mental status   DVT prophylaxis: lovenox (last eliquis dose 7/2 am) Code Status: Full Family Communication: Husband updated over the phone  Status is: Inpatient  Dispo: The patient is from: Home  Anticipated d/c is to: To be determined              Anticipated d/c date is: To be determined              Patient currently not medically stable for discharge  Consultants:  Nephro, neuro  Procedures:  None  Antimicrobials:  Discontinued 04/22/2022  Subjective: No acute issues or events overnight, mental status minimally improving, review of systems limited  Objective: Vitals:   04/23/22 2052 04/23/22 2058 04/23/22 2335 04/24/22 0438  BP: (!) 145/83  134/83 (!) 139/93  Pulse:  79    Resp: 18  20 18   Temp: 97.7 F (36.5 C)  98.9 F (37.2 C) 98.6 F (37 C)  TempSrc: Axillary  Oral Axillary  SpO2: 100% 100% 98% 98%  Weight:      Height:        Intake/Output Summary (Last 24 hours) at 04/24/2022 0730 Last data filed at 04/24/2022 0440 Gross per 24 hour  Intake 480 ml  Output 2425 ml  Net -1945 ml    Filed Weights   04/18/22 0500 04/19/22 0500 04/22/22 0230  Weight: 91 kg 91 kg 111 kg    Examination:  General:  Pleasantly resting in bed, No acute distress. HEENT:  Normocephalic atraumatic.  Sclerae nonicteric, noninjected.  Extraocular movements intact bilaterally. Neck:  Without mass or deformity.  Trachea is midline. Lungs: Diminished bilaterally with scant inspiratory and expiratory wheezes secondary to upper airway anatomy. Heart:  Regular rate and rhythm.  Without murmurs, rubs, or gallops. Abdomen:  Soft, nontender, nondistended.  Without guarding or rebound. Neuro: Patient unable  to respond appropriately she does follow simple commands on occasion this am Vascular:  Dorsalis pedis and posterior tibial pulses palpable bilaterally. Skin:  Warm and dry, no erythema, bilateral foot postoperative changes from previous amputations.  Data Reviewed: I have personally reviewed following labs and imaging studies  CBC: Recent Labs  Lab 04/17/22 1124 04/18/22 0326 04/20/22 0311 04/22/22 0336  WBC 24.5* 19.1* 12.4* 10.9*  NEUTROABS 18.4*  --  10.5*  --   HGB 11.2* 10.1* 8.2* 8.1*  HCT 38.1 34.3* 25.6* 25.3*  MCV 90.5 88.9 82.8 80.3  PLT 286 271 245 271    Basic Metabolic Panel: Recent Labs  Lab 04/19/22 0333 04/20/22 0311 04/21/22 0522 04/21/22 1242 04/22/22 0336 04/23/22 1015  NA 129* 127*  128* 126*  --  124* 126*  K 5.4* 4.9  4.9 5.1  --  4.9 4.6  CL 102 94*  96* 93*  --  90* 89*  CO2 15* 23  22 22   --  17* 18*  GLUCOSE 187* 156*  155* 143* 108* 148* 131*  BUN 43* 51*  52* 61*  --  67* 83*  CREATININE 3.68*  3.57* 4.37*  4.36* 5.36*  5.38*  --  6.23* 7.59*  CALCIUM 7.1* 6.6*  6.7* 6.5*  --  6.2* 6.5*  PHOS 4.7* 5.4* 6.2*  --  7.7* 9.3*    GFR: Estimated Creatinine Clearance: 10.3 mL/min (A) (by C-G formula based on SCr of 7.59 mg/dL (H)). Liver Function Tests: Recent Labs  Lab 04/17/22 1124 04/18/22 0326 04/19/22 0333 04/20/22 0311 04/21/22 0522 04/22/22 0336 04/22/22 2031 04/23/22 1015  AST 202* 933*  --  438*  --  162* 122*  --   ALT 54* 202*  --  203*  --  140* 120*  --   ALKPHOS 103 76  --  80  --  69 73  --  BILITOT 0.7 0.5  --  0.6  --  0.9 1.0  --   PROT 5.9* 4.7*  --  4.5*  --  4.6* 4.6*  --   ALBUMIN 2.9* 2.3*   < > 1.9*  1.9* 2.0* 1.8*  1.8* 1.8* 2.0*   < > = values in this interval not displayed.    No results for input(s): "LIPASE", "AMYLASE" in the last 168 hours. Recent Labs  Lab 04/20/22 1411  AMMONIA 14    Coagulation Profile: Recent Labs  Lab 04/17/22 1124  INR 1.8*    Cardiac Enzymes: Recent  Labs  Lab 04/18/22 1300 04/20/22 0311 04/21/22 0522 04/22/22 0336 04/23/22 1015  CKTOTAL 26,433* 13,704* 6,333* 4,496* 4,061*    BNP (last 3 results) No results for input(s): "PROBNP" in the last 8760 hours. HbA1C: No results for input(s): "HGBA1C" in the last 72 hours. CBG: Recent Labs  Lab 04/23/22 0611 04/23/22 1147 04/23/22 1636 04/23/22 2010 04/24/22 0652  GLUCAP 119* 122* 107* 115* 87    Lipid Profile: No results for input(s): "CHOL", "HDL", "LDLCALC", "TRIG", "CHOLHDL", "LDLDIRECT" in the last 72 hours. Thyroid Function Tests: No results for input(s): "TSH", "T4TOTAL", "FREET4", "T3FREE", "THYROIDAB" in the last 72 hours.  Anemia Panel: No results for input(s): "VITAMINB12", "FOLATE", "FERRITIN", "TIBC", "IRON", "RETICCTPCT" in the last 72 hours.  Sepsis Labs: Recent Labs  Lab 04/17/22 1125 04/17/22 1248 04/17/22 1756 04/17/22 2028 04/18/22 0326 04/19/22 0333 04/20/22 0311  PROCALCITON  --   --   --   --  4.45 4.17 2.95  LATICACIDVEN 4.7* 4.0* 3.9* 3.9*  --   --   --      Recent Results (from the past 240 hour(s))  Culture, blood (Routine x 2)     Status: None   Collection Time: 04/17/22 11:25 AM   Specimen: Left Antecubital; Blood  Result Value Ref Range Status   Specimen Description LEFT ANTECUBITAL  Final   Special Requests   Final    BOTTLES DRAWN AEROBIC AND ANAEROBIC Blood Culture results may not be optimal due to an inadequate volume of blood received in culture bottles   Culture   Final    NO GROWTH 5 DAYS Performed at [facility], [address] [place], [place] [address]    Report Status 04/22/2022 FINAL  Final  Culture, blood (Routine x 2)     Status: None   Collection Time: 04/17/22 11:34 AM   Specimen: BLOOD LEFT ARM  Result Value Ref Range Status   Specimen Description BLOOD LEFT ARM  Final   Special Requests   Final    BOTTLES DRAWN AEROBIC AND ANAEROBIC Blood Culture adequate volume   Culture   Final    NO GROWTH 5  DAYS Performed at [facility], [address] [place], [place] [address]    Report Status 04/22/2022 FINAL  Final  Urine Culture     Status: Abnormal   Collection Time: 04/17/22 11:53 AM   Specimen: Urine, Catheterized  Result Value Ref Range Status   Specimen Description   Final    URINE, CATHETERIZED Performed at [facility], [address], [place], [place] [address]    Special Requests   Final    NONE Performed at [facility], [address] [place], [place] [address]    Culture >=100,000 COLONIES/mL YEAST (A)  Final   Report Status 04/19/2022 FINAL  Final  Resp Panel by RT-PCR (Flu A&B, Covid) Anterior Nasal Swab     Status: Abnormal  Collection Time: 04/17/22 11:57 AM   Specimen: Anterior Nasal Swab  Result Value Ref Range Status   SARS Coronavirus 2 by RT PCR POSITIVE (A) NEGATIVE Final    Comment: (NOTE) SARS-CoV-2 target nucleic acids are DETECTED.  The SARS-CoV-2 RNA is generally detectable in upper respiratory specimens during the acute phase of infection. Positive results are indicative of the presence of the identified virus, but do not rule out bacterial infection or co-infection with other pathogens not detected by the test. Clinical correlation with patient history and other diagnostic information is necessary to determine patient infection status. The expected result is Negative.  Fact Sheet for Patients: EntrepreneurPulse.com.au  Fact Sheet for Healthcare Providers: IncredibleEmployment.be  This test is not yet approved or cleared by the [place] FDA and  has been authorized for detection and/or diagnosis of SARS-CoV-2 by FDA under an Emergency Use Authorization (EUA).  This EUA will remain in effect (meaning this test can be used) for the duration of  the COVID-19 declaration under Section 564(b)(1) of the A ct, 21 U.S.C. section 360bbb-3(b)(1), unless the authorization is terminated or revoked sooner.      Influenza A by PCR NEGATIVE NEGATIVE Final   Influenza B by PCR NEGATIVE NEGATIVE Final    Comment: (NOTE) The Xpert Xpress SARS-CoV-2/FLU/RSV plus assay is intended as an aid in the diagnosis of influenza from Nasopharyngeal swab specimens and should not be used as a sole basis for treatment. Nasal washings and aspirates are unacceptable for Xpert Xpress SARS-CoV-2/FLU/RSV testing.  Fact Sheet for Patients: EntrepreneurPulse.com.au  Fact Sheet for Healthcare Providers: IncredibleEmployment.be  This test is not yet approved or cleared by the [place] FDA and has been authorized for detection and/or diagnosis of SARS-CoV-2 by FDA under an Emergency Use Authorization (EUA). This EUA will remain in effect (meaning this test can be used) for the duration of the COVID-19 declaration under Section 564(b)(1) of the Act, 21 U.S.C. section 360bbb-3(b)(1), unless the authorization is terminated or revoked.  Performed at [facility], [address] [place], [place] [address]     Radiology Studies: MR BRAIN WO CONTRAST  Result Date: 04/23/2022 CLINICAL DATA:  Mental status changes. Unknown cause. EXAM: MRI HEAD WITHOUT CONTRAST TECHNIQUE: Multiplanar, multiecho pulse sequences of the brain and surrounding structures were obtained without intravenous contrast. COMPARISON:  CT head 04/17/2022 and 03/26/2022. FINDINGS: Brain: The study is moderately degraded by patient motion. Propeller sequences were utilized. 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 Dilated perivascular spaces are present throughout the basal ganglia. No acute infarct, hemorrhage, or mass lesion is present. White matter changes extend into the thalamus and brainstem. Cerebellum is within normal limits. The internal auditory canals are within normal limits. The ventricles are of normal size. No significant extraaxial fluid  collection is present. Vascular: Flow is present in the major intracranial arteries. Skull and upper cervical spine: The craniocervical junction is within normal limits. Marrow signal is unremarkable. No focal lesions are evident. Sinuses/Orbits: Mild mucosal thickening is present in the posteroinferior maxillary sinuses bilaterally. A left mastoid effusion is present. No obstructing nasopharyngeal lesion is present. The paranasal sinuses and mastoid air cells are otherwise clear. IMPRESSION: 1. No acute intracranial abnormality. 2. 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 3. Moderate diffuse white matter disease likely reflects the sequela of chronic microvascular ischemia. 4. Left mastoid effusion. No obstructing nasopharyngeal lesion is present.  Electronically Signed   By: Marin Roberts M.D.   On: 04/23/2022 14:52    Scheduled Meds:  Chlorhexidine Gluconate Cloth  6 each Topical Daily   DULoxetine  60 mg Oral BID   enoxaparin (LOVENOX) injection  110 mg Subcutaneous Q24H   folic acid  2 mg Oral Daily   guaiFENesin-dextromethorphan  10 mL Oral Q12H   insulin aspart  0-9 Units Subcutaneous TID WC   insulin glargine-yfgn  4 Units Subcutaneous QHS   linaclotide  145 mcg Oral QAC breakfast   pantoprazole  40 mg Oral Daily   predniSONE  20 mg Oral BID WC   senna  2 tablet Oral BID   vitamin B-12  1,000 mcg Oral Daily   Continuous Infusions:    LOS: 7 days   Time spent:  Azucena Fallen, DO [facility]  If 7PM-7AM, please contact night-coverage [email or web address]  04/24/2022, 7:30 AM

## 2022-04-24 NOTE — Plan of Care (Signed)
  Problem: Health Behavior/Discharge Planning: Goal: Ability to manage health-related needs will improve Outcome: Progressing   Problem: Clinical Measurements: Goal: Ability to maintain clinical measurements within normal limits will improve Outcome: Not Progressing Goal: Will remain free from infection Outcome: Not Progressing Goal: Diagnostic test results will improve Outcome: Not Progressing Goal: Respiratory complications will improve Outcome: Progressing Goal: Cardiovascular complication will be avoided Outcome: Progressing   Problem: Education: Goal: Knowledge of General Education information will improve Description: Including pain rating scale, medication(s)/side effects and non-pharmacologic comfort measures Outcome: Progressing

## 2022-04-24 NOTE — Progress Notes (Addendum)
Patient ID: [name], female   DOB: 03-31-1966, 56 y.o.   MRN: [id number] [facility] Progress Note   Assessment/ Plan:   1. Acute kidney Injury: Nonoliguric after holding furosemide.  Etiology appears unclear at this time but not suspected to be related to her history of ANCA associated GPA based on recent rituximab administration in May and preceding corticosteroid use as well as urinalysis not suggestive of GN.  Labs this morning continue to show worsening renal function/azotemia and I will request for placement of temporary dialysis catheter by IR. I have discussed my findings and proposed plans with her husband Charlynn Salih [phone]) over the phone and he gave verbal consent.  2.  Hyponatremia: Secondary to acute kidney injury and impaired free water handling, continue oral fluid restriction and monitor for renal recovery. 3.  Altered mental status: Etiology unclear but seen by neurology who support lumbar puncture to help rule out any encephalitis/meningitis given preceding immunosuppressant use (will need to be done by IR under fluoroscopy as she has an intrathecal pump).  If baclofen pump is on, this needs to be discontinued as toxicity is a concern in the setting of acute kidney injury (which could further worsen AMS). 4.  Sepsis: Suspected to be of pneumonia/urinary tract infection etiology and previously on antibiotics that are currently on hold. 5.  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  Awaiting transfer to tertiary center for specialized care by rheumatology. 6.  COVID-19 virus infection: Incidental positive COVID PCR found at admission.  On corticosteroids primarily as a continuum of her preceding corticosteroid use.  Did not get remdesivir due to elevated LFTs.  Subjective:   No acute events overnight.  Seen yesterday by neurology who opined that findings are consistent with uremic  encephalopathy although altered mental status preceded worsening renal function.   Objective:   BP (!) 139/93 (BP Location: Right Arm)   Pulse 79   Temp 98.6 F (37 C) (Axillary)   Resp 18   Ht 5\' 5"  (1.651 m)   Wt 111 kg   SpO2 98%   BMI 40.72 kg/m   Intake/Output Summary (Last 24 hours) at 04/24/2022 0747 Last data filed at 04/24/2022 0440 Gross per 24 hour  Intake 480 ml  Output 2425 ml  Net -1945 ml   Weight change:   Physical Exam: Gen: Awake, alert, limited verbal responses on repeated prompting CVS: Pulse regular rhythm, normal rate, S1 and S2 normal Resp: Coarse breath sounds bilaterally with transmitted sounds Abd: Soft, obese, nontender, bowel sounds normal Ext: Trace lower extremity edema.  Bilateral forefoot amputations noted  Imaging: MR BRAIN WO CONTRAST  Result Date: 04/23/2022 CLINICAL DATA:  Mental status changes. Unknown cause. EXAM: MRI HEAD WITHOUT CONTRAST TECHNIQUE: Multiplanar, multiecho pulse sequences of the brain and surrounding structures were obtained without intravenous contrast. COMPARISON:  CT head 04/17/2022 and 03/26/2022. FINDINGS: Brain: The study is moderately degraded by patient motion. Propeller sequences were utilized. 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 Dilated perivascular spaces are present throughout the basal ganglia. No acute infarct, hemorrhage, or mass lesion is present. White matter changes extend into the thalamus and brainstem. Cerebellum is within normal limits. The internal auditory canals are within normal limits. The ventricles are of normal size. No significant extraaxial fluid collection is present. Vascular: Flow is present in the major intracranial arteries. Skull and upper cervical spine:  The craniocervical junction is within normal limits. Marrow signal is unremarkable. No focal lesions are evident. Sinuses/Orbits: Mild mucosal thickening is present in the  posteroinferior maxillary sinuses bilaterally. A left mastoid effusion is present. No obstructing nasopharyngeal lesion is present. The paranasal sinuses and mastoid air cells are otherwise clear. IMPRESSION: 1. No acute intracranial abnormality. 2. 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 3. Moderate diffuse white matter disease likely reflects the sequela of chronic microvascular ischemia. 4. Left mastoid effusion. No obstructing nasopharyngeal lesion is present. Electronically Signed   By: Marin Roberts M.D.   On: 04/23/2022 14:52    Labs: BMET Recent Labs  Lab 04/17/22 1124 04/18/22 0326 04/19/22 0333 04/20/22 0311 04/21/22 0522 04/21/22 1242 04/22/22 0336 04/23/22 1015  NA 136 134* 129* 127*  128* 126*  --  124* 126*  K 4.7 4.8 5.4* 4.9  4.9 5.1  --  4.9 4.6  CL 106 106 102 94*  96* 93*  --  90* 89*  CO2 17* 18* 15* 23  22 22   --  17* 18*  GLUCOSE 253* 252* 187* 156*  155* 143* 108* 148* 131*  BUN 14 27* 43* 51*  52* 61*  --  67* 83*  CREATININE 1.73* 2.24* 3.68*  3.57* 4.37*  4.36* 5.36*  5.38*  --  6.23* 7.59*  CALCIUM 8.7* 7.8* 7.1* 6.6*  6.7* 6.5*  --  6.2* 6.5*  PHOS  --   --  4.7* 5.4* 6.2*  --  7.7* 9.3*   CBC Recent Labs  Lab 04/17/22 1124 04/18/22 0326 04/20/22 0311 04/22/22 0336  WBC 24.5* 19.1* 12.4* 10.9*  NEUTROABS 18.4*  --  10.5*  --   HGB 11.2* 10.1* 8.2* 8.1*  HCT 38.1 34.3* 25.6* 25.3*  MCV 90.5 88.9 82.8 80.3  PLT 286 271 245 271    Medications:     Chlorhexidine Gluconate Cloth  6 each Topical Daily   DULoxetine  60 mg Oral BID   enoxaparin (LOVENOX) injection  110 mg Subcutaneous Q24H   folic acid  2 mg Oral Daily   guaiFENesin-dextromethorphan  10 mL Oral Q12H   insulin aspart  0-9 Units Subcutaneous TID WC   insulin glargine-yfgn  4 Units Subcutaneous QHS   linaclotide  145 mcg Oral QAC breakfast   pantoprazole  40 mg Oral Daily   predniSONE  20 mg Oral  BID WC   senna  2 tablet Oral BID   vitamin B-12  1,000 mcg Oral Daily    06/23/22, MD 04/24/2022, 7:47 AM

## 2022-04-25 DIAGNOSIS — R6520 Severe sepsis without septic shock: Secondary | ICD-10-CM | POA: Diagnosis not present

## 2022-04-25 DIAGNOSIS — A419 Sepsis, unspecified organism: Secondary | ICD-10-CM | POA: Diagnosis not present

## 2022-04-25 LAB — HEPATIC FUNCTION PANEL
ALT: 77 U/L — ABNORMAL HIGH (ref 0–44)
AST: 95 U/L — ABNORMAL HIGH (ref 15–41)
Albumin: 1.9 g/dL — ABNORMAL LOW (ref 3.5–5.0)
Alkaline Phosphatase: 67 U/L (ref 38–126)
Bilirubin, Direct: <0.1 mg/dL (ref 0.0–0.2)
Total Bilirubin: 0.7 mg/dL (ref 0.3–1.2)
Total Protein: 5 g/dL — ABNORMAL LOW (ref 6.5–8.1)

## 2022-04-25 LAB — CBC
HCT: 25.2 % — ABNORMAL LOW (ref 36.0–46.0)
Hemoglobin: 8.3 g/dL — ABNORMAL LOW (ref 12.0–15.0)
MCH: 26.4 pg (ref 26.0–34.0)
MCHC: 32.9 g/dL (ref 30.0–36.0)
MCV: 80.3 fL (ref 80.0–100.0)
Platelets: 254 10*3/uL (ref 150–400)
RBC: 3.14 MIL/uL — ABNORMAL LOW (ref 3.87–5.11)
RDW: 20.9 % — ABNORMAL HIGH (ref 11.5–15.5)
WBC: 14.5 10*3/uL — ABNORMAL HIGH (ref 4.0–10.5)
nRBC: 0 % (ref 0.0–0.2)

## 2022-04-25 LAB — ANTI-DNA ANTIBODY, DOUBLE-STRANDED: ds DNA Ab: <1 IU/mL (ref 0–9)

## 2022-04-25 LAB — RENAL FUNCTION PANEL
Albumin: 1.9 g/dL — ABNORMAL LOW (ref 3.5–5.0)
Anion gap: 16 — ABNORMAL HIGH (ref 5–15)
BUN: 62 mg/dL — ABNORMAL HIGH (ref 6–20)
CO2: 24 mmol/L (ref 22–32)
Calcium: 7 mg/dL — ABNORMAL LOW (ref 8.9–10.3)
Chloride: 94 mmol/L — ABNORMAL LOW (ref 98–111)
Creatinine, Ser: 5.2 mg/dL — ABNORMAL HIGH (ref 0.44–1.00)
GFR, Estimated: 9 mL/min — ABNORMAL LOW (ref >60)
Glucose, Bld: 100 mg/dL — ABNORMAL HIGH (ref 70–99)
Phosphorus: 6.5 mg/dL — ABNORMAL HIGH (ref 2.5–4.6)
Potassium: 4 mmol/L (ref 3.5–5.1)
Sodium: 134 mmol/L — ABNORMAL LOW (ref 135–145)

## 2022-04-25 LAB — GLUCOSE, CAPILLARY
Glucose-Capillary: 77 mg/dL (ref 70–99)
Glucose-Capillary: 97 mg/dL (ref 70–99)
Glucose-Capillary: 109 mg/dL — ABNORMAL HIGH (ref 70–99)
Glucose-Capillary: 117 mg/dL — ABNORMAL HIGH (ref 70–99)

## 2022-04-25 LAB — ANA W/REFLEX IF POSITIVE: Anti Nuclear Antibody (ANA): NEGATIVE

## 2022-04-25 LAB — CK: Total CK: 3558 U/L — ABNORMAL HIGH (ref 38–234)

## 2022-04-25 NOTE — Progress Notes (Signed)
Hd tx of 2.5hrs completed. Pt tolerated tx well. Hd cath dressing replaced using aseptic technique. No pt acute distress noted. Report given to [employer], rn. Total uf removed: Post hd v/s: 142/88(103) 91 98.1 23 99% Post weight: 104.7kg

## 2022-04-25 NOTE — Progress Notes (Addendum)
Neurology Progress Note   S:// Patient is laying in bed, drowsy, opens eyes spontaneously. No family at the bedside. No new neurological events overnight. Patient is able to state her name. Unable to state correct age "72", and place she names as AP. She is following commands, moving all extremities. Mild asterixis noted. Received HD overnight. She c/o HD catheter hurting her neck   O:// Current vital signs: BP (!) 150/84 (BP Location: Left Arm)   Pulse 86   Temp 98.6 F (37 C) (Oral)   Resp 20   Ht 5\' 5"  (1.651 m)   Wt 104.7 kg   SpO2 96%   BMI 38.41 kg/m  Vital signs in last 24 hours: Temp:  [98 F (36.7 C)-99 F (37.2 C)] 98.6 F (37 C) (07/04 0839) Pulse Rate:  [79-91] 86 (07/04 0839) Resp:  [18-25] 20 (07/04 0839) BP: (135-151)/(65-89) 150/84 (07/04 0839) SpO2:  [95 %-100 %] 96 % (07/04 0839) Weight:  [104.7 kg-105.2 kg] 104.7 kg (07/04 0050)  GENERAL: drowsy laying in bed in NAD HEENT: - Normocephalic and atraumatic, dry mm LUNGS - bilateral with  wheezes CV - S1S2 RRR, no m/r/g, equal pulses bilaterally. ABDOMEN - Soft, nontender, nondistended with normoactive BS Ext: warm, well perfused, intact peripheral pulses, no edema. Multiple bruises   NEURO:  Mental Status: confused. AA&Ox1. States incorrect age "29" and place "AP". Tracks. Can name objects at times. Speech is delayed. Able to follow simple commands Cranial Nerves: PERRL 3 mm/brisk. EOMI, visual fields full to threat, no facial asymmetry, facial sensation intact, hearing intact, tongue/uvula/soft palate midline, normal sternocleidomastoid and trapezius muscle strength. No evidence of tongue atrophy or fibrillations Motor: arms are antigravity with asterixis of both upper extremities. Legs bilateral move spontaneously, does not lift off the bed. Tone: is normal and bulk is normal Sensation- appears to be intact  Coordination: unable to assess  Gait- deferred    Medications  Current Facility-Administered  Medications:    acetaminophen (TYLENOL) tablet 650 mg, 650 mg, Oral, Q6H PRN, Tat, David, MD, 650 mg at 04/22/22 1238   albuterol (PROVENTIL) (2.5 MG/3ML) 0.083% nebulizer solution 2.5 mg, 2.5 mg, Nebulization, BID PRN, 06/23/22, MD   alteplase (CATHFLO ACTIVASE) injection 2 mg, 2 mg, Intracatheter, Once PRN, Azucena Fallen, MD   anticoagulant sodium citrate solution 5 mL, 5 mL, Dialysis, PRN, Zetta Bills, MD   Chlorhexidine Gluconate Cloth 2 % PADS 6 each, 6 each, Topical, Daily, Tat, David, MD, 6 each at 04/25/22 0901   DULoxetine (CYMBALTA) DR capsule 30 mg, 30 mg, Oral, Daily, 06/26/22, MD, 30 mg at 04/25/22 0854   enoxaparin (LOVENOX) injection 110 mg, 110 mg, Subcutaneous, Q24H, 06/26/22, MD, 110 mg at 04/24/22 2351   folic acid (FOLVITE) tablet 2 mg, 2 mg, Oral, Daily, Tat, David, MD, 2 mg at 04/25/22 0854   guaiFENesin-dextromethorphan (ROBITUSSIN DM) 100-10 MG/5ML syrup 10 mL, 10 mL, Oral, Q12H, Tat, David, MD, 10 mL at 04/25/22 0855   heparin injection 1,000 Units, 1,000 Units, Intracatheter, PRN, 06/26/22, MD   insulin aspart (novoLOG) injection 0-9 Units, 0-9 Units, Subcutaneous, TID WC, Zetta Bills, MD, 1 Units at 04/24/22 1744   insulin glargine-yfgn (SEMGLEE) injection 4 Units, 4 Units, Subcutaneous, QHS, Tat, 06/25/22, MD, 4 Units at 04/24/22 2351   lidocaine (LIDODERM) 5 % 1 patch, 1 patch, Transdermal, Daily PRN, 06/25/22, MD, 1 patch at 04/17/22 2123   linaclotide (LINZESS) capsule 145 mcg, 145 mcg, Oral, QAC breakfast, Tat, 2124,  MD, 145 mcg at 04/25/22 0854   methocarbamol (ROBAXIN) tablet 500 mg, 500 mg, Oral, Q8H PRN, Tat, David, MD, 500 mg at 04/25/22 0854   ondansetron (ZOFRAN) tablet 4 mg, 4 mg, Oral, Q6H PRN **OR** ondansetron (ZOFRAN) injection 4 mg, 4 mg, Intravenous, Q6H PRN, Tat, David, MD   pantoprazole (PROTONIX) EC tablet 40 mg, 40 mg, Oral, Daily, Tat, David, MD, 40 mg at 04/25/22 [id number]   predniSONE (DELTASONE) tablet 20 mg, 20 mg,  Oral, BID WC, Tat, Shanon Brow, MD, 20 mg at 04/25/22 0854   senna (SENOKOT) tablet 17.2 mg, 2 tablet, Oral, BID, Tat, David, MD, 17.2 mg at 04/21/22 [id number]   vitamin B-12 (CYANOCOBALAMIN) tablet 1,000 mcg, 1,000 mcg, Oral, Daily, Tat, David, MD, 1,000 mcg at 04/25/22 0852  LABS:  Na 126->134 BUN 83->62 Cr 7.59->7.0 Phos 9.3->6.5 Ca 6.5->7.0 CK 4061->3558 WBC 10.9->14.5 HGB 8.1->8.3 Plt 271->254 AST 122->95 ALT 120->77 ANCA, ANA, anti-dsDNA, cryos, c3, c4, and spep/sflc, hepatitis, HIV, CMV, EBV- pending Ammonia 14 on 6/29 TSH 1.657 Vit B12 909    Imaging I have reviewed images in epic and the results pertinent to this consultation are:  CT-head 6/26: No acute abnormality. 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 Degenerative disc and facet disease changes of the cervical spine. No acute cervical spine abnormalities.   MRI examination of the brain 7/2: 1. No acute intracranial abnormality. 2. 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 3. Moderate diffuse white matter disease likely reflects the sequela of chronic microvascular ischemia. 4. Left mastoid effusion. No obstructing nasopharyngeal lesion is present.   rEEG 6/29: This study is suggestive of moderate diffuse encephalopathy, nonspecific to etiology. No seizures or epileptiform discharges were seen throughout the recording. Patient was noted to have brief intermittent whole body twitching as well as asynchronous and non- rhythmic twitching of arms and legs without concomitant EEG change. These episodes were NOT epileptic.    Assessment:  56 y.o. female with a past medical history of  Wegener's angiitis, chronic opioid dependency, hyperlipidemia, hypertension, impaired glucose tolerance, adrenal insufficiency on chronic prednisone usage, rheumatoid arthritis and multiple admissions secondary to sepsis in the setting of pneumonia and UTI.    Impression: - Acute encephalopathy/hepatic encephalopathy/uremic encephalopathy is most likely multifactorial due to metabolic derangements, AKI, COVID 19 infection, sepsis/infection, Elevated liver enzymes  Recommendations: Continue to correct metabolic derangements per primary team  - Continue to treat infections - Limit sedating medications - Delirium precautions - Consider LP to r/o encephalitis/meningitis if AMS does not improve with correction of metabolic derangements and infection. She has an intrathecal pump. If LP is determined to be necessary, then it will need to be obtained under fluoro - Neurology will continue to follow   Beulah Gandy DNP, ACNPC-AG  NEUROHOSPITALIST ADDENDUM Performed a face to face diagnostic evaluation.   I have reviewed the contents of history and physical exam as documented by PA/ARNP/Resident and agree with above documentation.  I have discussed and formulated the above plan as documented. Edits to the note have been made as needed.  Impression/Key exam findings/Plan: mentation is improved after dialysis. I would hold off on LP at this time and further workup. However, would consider getting fluoro guided LP(unable to do bedside 2/2 baclofen pump) and CT Angio head and neck(to evaluate for CNS vasculitis) if she does not continue to improve. Treatment of metabolic derangements and covid per primary team. Also recommend delirium precautions.  Laetitia Schnepf  Derry Lory, MD [facility] [phone]   If 7pm to 7am, please call on call as listed on AMION.

## 2022-04-25 NOTE — Progress Notes (Signed)
HD catheter dressing changed by dialysis RN after treatment. States patient access is positional and possible loose stitch at site. Pressure held but new drainage visible on dressing. Oncoming RN aware and will continue to monitor.

## 2022-04-25 NOTE — Progress Notes (Signed)
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  Patient presents with worsening intermittent altered mental status and increased weakness.  She has been declining since approximately January, 6 months ago per husband.  She was recently admitted to our facility and treated for pneumonia with moderate improvement.  She was to continue on vancomycin through 04/08/2022 followed by infectious disease.  Bronchoscopy during that admission on 03/30/2022 shows Candida in the setting of ongoing antibiotics and urine culture ultimately grew yeast - deemed to be colonized and not active infection given fungitel was reassuringly low per ID discussion. Of note she is also had a recent diagnosis of pulmonary embolism on apixaban.  Patient admitted this hospitalization (on 04/17/22) with elevated white count, lactic acid, and acute renal failure in the setting of poor p.o. intake altered mental status tachycardia and tachypnea concerning for sepsis. Patient's infectious source was presumed to be pneumonia versus UTI - placed initially on broad-spectrum antibiotics of vancomycin and cefepime. Her sepsis criteria did not improve and her renal function continued to worsen in the setting of presumed sepsis and rhabdomyolysis (ATN). Nephrology following with worsening mental status, tremors/muscle spasms with metabolic derangements. Patient transferred to main campus for neurology evaluation, EEG ultimately unremarkable for seizures-her movement was considered to be musculoskeletal, somewhat choreiform in nature, holding off on LP per neuro given improvements with HD and intrathecal pump turned down (unable to be turned off but can be turned  to [facility]).  Assessment & Plan:   Principal Problem:   Severe sepsis ([place]) Active Problems:   Acute cystitis without hematuria   Current chronic use of systemic steroids   Chronic pain   Secondary adrenal insufficiency (HCC)   AKI (acute kidney injury) ([place])   Granulomatosis with polyangiitis (HCC)   GERD (gastroesophageal reflux disease)   Essential hypertension   Obesity (BMI 30-39.9)   COVID-19 virus infection   Acute metabolic encephalopathy   Pulmonary embolism (HCC)   Transaminitis   Rhabdomyolysis  Acute metabolic encephalopathy, POA, minimally improving -Initially presumed to be secondary to sepsis/renal failure/hyponatremia  -Potential covid encephalitis, however spastic choreiform movements are not consistent with COVID encephalitis -Mental status appears to be minimally improving today -patient able to follow simple commands today -Discontinue antibiotics as below, monitor for CNS depressants -Discussed case with rheumatology, as below we will follow along for possible imaging to rule out CNS vasculitis presentation of granulomatosis/polyangiitis - MRI unremarkable for acute process - Consider CTA for vasculitis workup if she does not continue to improve over the next few days. -Neuro consulted, EEG unremarkable for seizure -we discussed potential need for LP (holding for now given improvement in symptoms) -Nephrology following along, appreciate insight recommendations, they have sent off ANCA, ANA, anti-dsDNA, cryos, c3, c4, and spep/sflc, hepatitis, HIV, CMV, EBV -Previously discontinued medications include gabapentin, cefepime, Nucynta -Appreciate neurology insight and recommendations in this difficult case  Granulomatosis with polyangiitis ([place]) -Patient receiving rituximab and methotrexate as an outpatient(most recently at [facility] in May 2023) - confirmed with rheumatology at [place] prednisone as below -Patient has had somewhat splintered medical care in  the setting of rheumatology due to her complex disease state, plan to follow-up at [facility] but has been too unstable require multiple hospital admissions and has not yet been able to follow-up  at this facility. -Discussed case with Dr. Benjamine Mola rheumatology at [facility], pending imaging of the brain to confirm potential CNS vasculitis he would be willing to accept the patient to their service at [facility] for closer monitoring and to further discuss more novel/off label treatment as indicated -patient's husband would prefer transfer to [facility] if possible- MRI unrevealing. Consider CTA(once HD established) to evaluate per rheum recommendations  AKI (acute kidney injury) (HCC)/end-stage renal disease - Acute renal failure with a creatinine of 1.73 at time of admission. - Complicated and multifactorial etiology -possibly ATN per discussion with nephrology - Nephrology following - IR to place temp HD catheter 04/24/2022 tolerated hemodialysis overnight without complication.  Acute hypoxic respiratory failure, POA -Multifactorial in the setting of metabolic encephalopathy, presumed pneumonia, granulomatosis/polyangiitis, COVID-19 infection -Potentially exacerbated by hypervolemia  Presumed severe sepsis secondary to pneumonia versus UTI, resolved ([place]) -Presumed at intake in the setting of leukocytosis tachycardia and low-grade temperature -Unfortunately procalcitonin can be falsely positive in vasculitis/renal dysfunction -Patient was presumed to have a UTI versus pneumonia given her history, has now completed 5 days of antibiotics - discussed with ID - discontinued 04/23/22 -Cdiff positive but she is colonized (see micro history) and toxin is negative - soft stool over past 24h but WBC downtrending - no indication for treatment at this time -Previous candida per bronch and yeast in urine likely contaminant in the setting of prolonged antibiotics and colonization per ID  Secondary adrenal insufficiency  (HCC) -Continue steroids   Chronic pain -Discontinued home Neurontin and Nucynta given mental status changes -Intrathecal pump to lowest setting(Protec these " cannot be turned off completely or they will break)  Current chronic use of systemic steroids -5 mg of prednisone on daily basis at baseline -Solu-Cortef x1 given while in the ED at time of admission. -Continue higher dose of prednisone 20 mg bid -may decrease steroid regimen in the next 24 to 48 hours given questionable steroid-induced psychosis however this would be less likely etiology given above  GERD (gastroesophageal reflux disease) -Continue PPI.   Essential hypertension -Currently well controlled off home regimen, continue to follow clinically  Rhabdomyolysis, improving -Secondary to prolonged downtime -CPK downtrending appropriately in the setting of IV fluids(now discontinued) -Continue volume management with dialysis -Defer to nephrology for ongoing fluids and diuretics   Elevated AST/ALT, resolving -Most likely in the setting of sepsis/shock liver and rhabdomyolysis. -Downtrending appropriately -Avoid hepatotoxic medications   Pulmonary embolism ([place]), POA -Somewhat new diagnosis, diagnosed during previous hospitalization 03/27/2022  -Transition from eliquis to lovenox (for possible procedure/LP/HD line placement)  COVID-19 virus infection, questionably incidental - COVID PCR incidentally positive at time of admission -without respiratory symptoms - Inflammatory markers suggesting bacterial infection and very likely component of underlying rheumatologic condition. - Procalcitonin elevated, outlined as above this can be falsely positive and vasculitis in ESRD patients - Steroids ongoing as above - Remdesivir withheld due to elevated liver enzymes - Will complete quarantine 04/27/22   Obesity (BMI 30-39.9) Estimated body mass index is 38.37 kg/m as calculated from the following:   Height as of this encounter:  5\' 5"  (1.651 m).   Weight as of this encounter: 104.6 kg.   Chronic opioid dependence ([place]) - Patient has intrathecal pump with bupivacaine and ziconotide - Last refill 03/02/2022 - unclear if this is currently running. - Patient receives Nucynta 75 mg, #120; last refill 03/17/2022 - Patient is also on oxycodone 10 mg; #30, last refilled 02/23/2022 - Narcotics weaned down(eventually stopped) given worsening mental status -  Avoid CNS depressants   Cdiff, presumed colonized, likely POA - Transient episodes of loose stool/diarrhea - GI panel unremarkable - Cdiff Ag positive, toxin negative -in the setting of distant C. difficile infection  - no indication for treatment as symptoms are improving, white count downtrending  Chemotherapy-induced peripheral neuropathy (HCC) At home--is on gabapentin, Cymbalta, amitriptyline topiramate, trazodone, nucynta--all have been held given worsening mental status   DVT prophylaxis: lovenox (last eliquis dose 7/2 am) Code Status: Full Family Communication: Husband updated over the phone  Status is: Inpatient  Dispo: The patient is from: Home              Anticipated d/c is to: To be determined              Anticipated d/c date is: To be determined              Patient currently not medically stable for discharge  Consultants:  Nephro, neuro  Procedures:  None  Antimicrobials:  Discontinued 04/22/2022  Subjective: No acute issues or events overnight, mental status minimally improving, review of systems remains limited  Objective: Vitals:   04/25/22 0045 04/25/22 0050 04/25/22 0100 04/25/22 0500  BP:      Pulse: 91     Resp:    20  Temp: 98.1 F (36.7 C)  98.6 F (37 C) 98.9 F (37.2 C)  TempSrc: Oral  Oral Axillary  SpO2: 99%  96% 95%  Weight:  104.7 kg    Height:        Intake/Output Summary (Last 24 hours) at 04/25/2022 0721 Last data filed at 04/25/2022 0536 Gross per 24 hour  Intake 530 ml  Output 2200 ml  Net -1670 ml     Filed Weights   04/22/22 0230 04/24/22 2130 04/25/22 0050  Weight: 111 kg 105.2 kg 104.7 kg    Examination:  General:  Pleasantly resting in bed, No acute distress. HEENT:  Normocephalic atraumatic.  Sclerae nonicteric, noninjected.  Extraocular movements intact bilaterally. Neck:  Without mass or deformity.  Trachea is midline. Lungs: Diminished bilaterally with scant inspiratory and expiratory wheezes secondary to upper airway anatomy. Heart:  Regular rate and rhythm.  Without murmurs, rubs, or gallops. Abdomen:  Soft, nontender, nondistended.  Without guarding or rebound. Neuro: Patient unable to respond appropriately she does follow simple commands on occasion this am but has very limited control of her limbs or speech -able to answer yes no appropriately this morning with some effort Vascular:  Dorsalis pedis and posterior tibial pulses palpable bilaterally. Skin:  Warm and dry, no erythema, bilateral foot postoperative changes from previous amputations.  Data Reviewed: I have personally reviewed following labs and imaging studies  CBC: Recent Labs  Lab 04/20/22 0311 04/22/22 0336 04/24/22 0816 04/25/22 0022  WBC 12.4* 10.9* 13.5* 14.5*  NEUTROABS 10.5*  --   --   --   HGB 8.2* 8.1* 9.0* 8.3*  HCT 25.6* 25.3* 27.9* 25.2*  MCV 82.8 80.3 81.3 80.3  PLT 245 271 271 254    Basic Metabolic Panel: Recent Labs  Lab 04/21/22 0522 04/21/22 1242 04/22/22 0336 04/23/22 1015 04/24/22 0816 04/25/22 0022  NA 126*  --  124* 126* 127* 134*  K 5.1  --  4.9 4.6 5.1 4.0  CL 93*  --  90* 89* 90* 94*  CO2 22  --  17* 18* 17* 24  GLUCOSE 143* 108* 148* 131* 81 100*  BUN 61*  --  67* 83* 101* 62*  CREATININE 5.36*  5.38*  --  6.23* 7.59* 8.35* 5.20*  CALCIUM 6.5*  --  6.2* 6.5* 6.3* 7.0*  PHOS 6.2*  --  7.7* 9.3* 11.2* 6.5*    GFR: Estimated Creatinine Clearance: 14.5 mL/min (A) (by C-G formula based on SCr of 5.2 mg/dL (H)). Liver Function Tests: Recent Labs  Lab  04/20/22 0311 04/21/22 0522 04/22/22 0336 04/22/22 2031 04/23/22 1015 04/24/22 0816 04/25/22 0022  AST 438*  --  162* 122*  --   --  95*  ALT 203*  --  140* 120*  --   --  77*  ALKPHOS 80  --  69 73  --   --  67  BILITOT 0.6  --  0.9 1.0  --   --  0.7  PROT 4.5*  --  4.6* 4.6*  --   --  5.0*  ALBUMIN 1.9*  1.9*   < > 1.8*  1.8* 1.8* 2.0* 1.9* 1.9*  1.9*   < > = values in this interval not displayed.    No results for input(s): "LIPASE", "AMYLASE" in the last 168 hours. Recent Labs  Lab 04/20/22 1411  AMMONIA 14    Coagulation Profile: No results for input(s): "INR", "PROTIME" in the last 168 hours.  Cardiac Enzymes: Recent Labs  Lab 04/21/22 0522 04/22/22 0336 04/23/22 1015 04/24/22 0816 04/25/22 0022  CKTOTAL 6,333* 4,496* 4,061* 4,049* 3,558*    BNP (last 3 results) No results for input(s): "PROBNP" in the last 8760 hours. HbA1C: No results for input(s): "HGBA1C" in the last 72 hours. CBG: Recent Labs  Lab 04/23/22 2010 04/24/22 0652 04/24/22 1307 04/24/22 1608 04/24/22 2058  GLUCAP 115* 87 99 131* 135*    Lipid Profile: No results for input(s): "CHOL", "HDL", "LDLCALC", "TRIG", "CHOLHDL", "LDLDIRECT" in the last 72 hours. Thyroid Function Tests: No results for input(s): "TSH", "T4TOTAL", "FREET4", "T3FREE", "THYROIDAB" in the last 72 hours.  Anemia Panel: No results for input(s): "VITAMINB12", "FOLATE", "FERRITIN", "TIBC", "IRON", "RETICCTPCT" in the last 72 hours.  Sepsis Labs: Recent Labs  Lab 04/19/22 0333 04/20/22 0311  PROCALCITON 4.17 2.95     Recent Results (from the past 240 hour(s))  Culture, blood (Routine x 2)     Status: None   Collection Time: 04/17/22 11:25 AM   Specimen: Left Antecubital; Blood  Result Value Ref Range Status   Specimen Description LEFT ANTECUBITAL  Final   Special Requests   Final    BOTTLES DRAWN AEROBIC AND ANAEROBIC Blood Culture results may not be optimal due to an inadequate volume of blood  received in culture bottles   Culture   Final    NO GROWTH 5 DAYS Performed at [facility], [address], [place], [place] [address]    Report Status 04/22/2022 FINAL  Final  Culture, blood (Routine x 2)     Status: None   Collection Time: 04/17/22 11:34 AM   Specimen: BLOOD LEFT ARM  Result Value Ref Range Status   Specimen Description BLOOD LEFT ARM  Final   Special Requests   Final    BOTTLES DRAWN AEROBIC AND ANAEROBIC Blood Culture adequate volume   Culture   Final    NO GROWTH 5 DAYS Performed at [facility], [address], [place], [place] [address]    Report Status 04/22/2022 FINAL  Final  Urine Culture     Status: Abnormal   Collection Time: 04/17/22 11:53 AM   Specimen: Urine, Catheterized  Result Value Ref Range Status   Specimen Description   Final  URINE, CATHETERIZED Performed at [facility], [address] [place], [place] [address]    Special Requests   Final    NONE Performed at [facility], [address] [place], [place] [address]    Culture >=100,000 COLONIES/mL YEAST (A)  Final   Report Status 04/19/2022 FINAL  Final  Resp Panel by RT-PCR (Flu A&B, Covid) Anterior Nasal Swab     Status: Abnormal   Collection Time: 04/17/22 11:57 AM   Specimen: Anterior Nasal Swab  Result Value Ref Range Status   SARS Coronavirus 2 by RT PCR POSITIVE (A) NEGATIVE Final    Comment: (NOTE) SARS-CoV-2 target nucleic acids are DETECTED.  The SARS-CoV-2 RNA is generally detectable in upper respiratory specimens during the acute phase of infection. Positive results are indicative of the presence of the identified virus, but do not rule out bacterial infection or co-infection with other pathogens not detected by the test. Clinical correlation with patient history and other diagnostic information is necessary to determine patient infection status. The expected result is Negative.  Fact Sheet for Patients: EntrepreneurPulse.com.au  Fact Sheet for  Healthcare Providers: IncredibleEmployment.be  This test is not yet approved or cleared by the [place] FDA and  has been authorized for detection and/or diagnosis of SARS-CoV-2 by FDA under an Emergency Use Authorization (EUA).  This EUA will remain in effect (meaning this test can be used) for the duration of  the COVID-19 declaration under Section 564(b)(1) of the A ct, 21 U.S.C. section 360bbb-3(b)(1), unless the authorization is terminated or revoked sooner.     Influenza A by PCR NEGATIVE NEGATIVE Final   Influenza B by PCR NEGATIVE NEGATIVE Final    Comment: (NOTE) The Xpert Xpress SARS-CoV-2/FLU/RSV plus assay is intended as an aid in the diagnosis of influenza from Nasopharyngeal swab specimens and should not be used as a sole basis for treatment. Nasal washings and aspirates are unacceptable for Xpert Xpress SARS-CoV-2/FLU/RSV testing.  Fact Sheet for Patients: EntrepreneurPulse.com.au  Fact Sheet for Healthcare Providers: IncredibleEmployment.be  This test is not yet approved or cleared by the [place] FDA and has been authorized for detection and/or diagnosis of SARS-CoV-2 by FDA under an Emergency Use Authorization (EUA). This EUA will remain in effect (meaning this test can be used) for the duration of the COVID-19 declaration under Section 564(b)(1) of the Act, 21 U.S.C. section 360bbb-3(b)(1), unless the authorization is terminated or revoked.  Performed at [facility], [address], [place], [place] [address]   Gastrointestinal Panel by PCR , Stool     Status: None   Collection Time: 04/23/22  3:55 PM   Specimen: Stool  Result Value Ref Range Status   Campylobacter species NOT DETECTED NOT DETECTED Final   Plesimonas shigelloides NOT DETECTED NOT DETECTED Final   Salmonella species NOT DETECTED NOT DETECTED Final   Yersinia enterocolitica NOT DETECTED NOT DETECTED Final   Vibrio species NOT  DETECTED NOT DETECTED Final   Vibrio cholerae NOT DETECTED NOT DETECTED Final   Enteroaggregative E coli (EAEC) NOT DETECTED NOT DETECTED Final   Enteropathogenic E coli (EPEC) NOT DETECTED NOT DETECTED Final   Enterotoxigenic E coli (ETEC) NOT DETECTED NOT DETECTED Final   Shiga like toxin producing E coli (STEC) NOT DETECTED NOT DETECTED Final   Shigella/Enteroinvasive E coli (EIEC) NOT DETECTED NOT DETECTED Final   Cryptosporidium NOT DETECTED NOT DETECTED Final   Cyclospora cayetanensis NOT DETECTED NOT DETECTED Final   Entamoeba histolytica NOT DETECTED NOT DETECTED Final   Giardia  lamblia NOT DETECTED NOT DETECTED Final   Adenovirus F40/41 NOT DETECTED NOT DETECTED Final   Astrovirus NOT DETECTED NOT DETECTED Final   Norovirus GI/GII NOT DETECTED NOT DETECTED Final   Rotavirus A NOT DETECTED NOT DETECTED Final   Sapovirus (I, II, IV, and V) NOT DETECTED NOT DETECTED Final    Comment: Performed at [facility], [address] [place], [place] [address]  C Difficile Quick Screen (NO PCR Reflex)     Status: Abnormal   Collection Time: 04/23/22  3:57 PM   Specimen: Stool  Result Value Ref Range Status   C Diff antigen POSITIVE (A) NEGATIVE Final   C Diff toxin NEGATIVE NEGATIVE Final   C Diff interpretation   Final    Results are indeterminate. Please contact the provider listed for your campus for C diff questions in AMION.    Comment: Performed at [facility], [address], [place], [place] [address]    Radiology Studies: IR Fluoro Guide CV Line Right  Result Date: 04/24/2022 INDICATION: 56 year old female in need of hemodialysis EXAM: IR ULTRASOUND GUIDANCE VASC ACCESS RIGHT; IR RIGHT FLUORO GUIDE CV LINE MEDICATIONS: None ANESTHESIA/SEDATION: None FLUOROSCOPY TIME:  Radiation Exposure Index: 2 mGy reference air Kerma COMPLICATIONS: None immediate. PROCEDURE: Informed written consent was obtained from the patient after a thorough discussion of the procedural  risks, benefits and alternatives. All questions were addressed. Maximal Sterile Barrier Technique was utilized including caps, mask, sterile gowns, sterile gloves, sterile drape, hand hygiene and skin antiseptic. A timeout was performed prior to the initiation of the procedure. The RIGHT internal jugular vein was interrogated with ultrasound and found to be widely patent. An image was obtained and stored for the medical record. Local anesthesia was attained by infiltration with 1% lidocaine. A small dermatotomy was made. Under real-time sonographic guidance, the vessel was punctured with a 21 gauge micropuncture needle. Using standard technique, the initial micro needle was exchanged over a 0.018 micro wire for a transitional 4 [place] micro sheath. The micro sheath was then exchanged over a 0.035 wire for a fascial dilator and the tract was dilated. A 16 cm non-tunneled hemodialysis catheter was then placed over the wire and into the upper RA. Images were obtained and stored for the medical record. The catheter flushes and aspirates easily. It was flushed, capped and secured to the skin with 0 Prolene suture. Sterile bandages were applied. IMPRESSION: Successful placement of a 16 cm right IJ approach non tunneled hemodialysis catheter. Catheter tip is in the upper right atrium and the device is ready for immediate use. Signed, Sterling Big, MD, RPVI [facility] Radiology Electronically Signed   By: Malachy Moan M.D.   On: 04/24/2022 17:58   IR US Guide Vasc Access Right  Result Date: 04/24/2022 INDICATION: 56 year old female in need of hemodialysis EXAM: IR ULTRASOUND GUIDANCE VASC ACCESS RIGHT; IR RIGHT FLUORO GUIDE CV LINE MEDICATIONS: None ANESTHESIA/SEDATION: None FLUOROSCOPY TIME:  Radiation Exposure Index: 2 mGy reference air Kerma COMPLICATIONS: None immediate. PROCEDURE: Informed written consent was obtained from the patient after a thorough  discussion of the procedural risks, benefits and alternatives. All questions were addressed. Maximal Sterile Barrier Technique was utilized including caps, mask, sterile gowns, sterile gloves, sterile drape, hand hygiene and skin antiseptic. A timeout was performed prior to the initiation of the procedure. The RIGHT internal jugular vein was interrogated with ultrasound and found to be widely patent. An image was obtained and stored for  the medical record. Local anesthesia was attained by infiltration with 1% lidocaine. A small dermatotomy was made. Under real-time sonographic guidance, the vessel was punctured with a 21 gauge micropuncture needle. Using standard technique, the initial micro needle was exchanged over a 0.018 micro wire for a transitional 4 [place] micro sheath. The micro sheath was then exchanged over a 0.035 wire for a fascial dilator and the tract was dilated. A 16 cm non-tunneled hemodialysis catheter was then placed over the wire and into the upper RA. Images were obtained and stored for the medical record. The catheter flushes and aspirates easily. It was flushed, capped and secured to the skin with 0 Prolene suture. Sterile bandages were applied. IMPRESSION: Successful placement of a 16 cm right IJ approach non tunneled hemodialysis catheter. Catheter tip is in the upper right atrium and the device is ready for immediate use. Signed, Criselda Peaches, MD, [place] [facility] Radiology Electronically Signed   By: Jacqulynn Cadet M.D.   On: 04/24/2022 17:58   MR BRAIN WO CONTRAST  Result Date: 04/23/2022 CLINICAL DATA:  Mental status changes. Unknown cause. EXAM: MRI HEAD WITHOUT CONTRAST TECHNIQUE: Multiplanar, multiecho pulse sequences of the brain and surrounding structures were obtained without intravenous contrast. COMPARISON:  CT head 04/17/2022 and 03/26/2022. FINDINGS: Brain: The study is moderately degraded by patient motion.  Propeller sequences were utilized. 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 Dilated perivascular spaces are present throughout the basal ganglia. No acute infarct, hemorrhage, or mass lesion is present. White matter changes extend into the thalamus and brainstem. Cerebellum is within normal limits. The internal auditory canals are within normal limits. The ventricles are of normal size. No significant extraaxial fluid collection is present. Vascular: Flow is present in the major intracranial arteries. Skull and upper cervical spine: The craniocervical junction is within normal limits. Marrow signal is unremarkable. No focal lesions are evident. Sinuses/Orbits: Mild mucosal thickening is present in the posteroinferior maxillary sinuses bilaterally. A left mastoid effusion is present. No obstructing nasopharyngeal lesion is present. The paranasal sinuses and mastoid air cells are otherwise clear. IMPRESSION: 1. No acute intracranial abnormality. 2. 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 3. Moderate diffuse white matter disease likely reflects the sequela of chronic microvascular ischemia. 4. Left mastoid effusion. No obstructing nasopharyngeal lesion is present. Electronically Signed   By: San Morelle M.D.   On: 04/23/2022 14:52    Scheduled Meds:  Chlorhexidine Gluconate Cloth  6 each Topical Daily   DULoxetine  30 mg Oral Daily   enoxaparin (LOVENOX) injection  110 mg Subcutaneous [id number]   folic acid  2 mg Oral Daily   guaiFENesin-dextromethorphan  10 mL Oral Q12H   heparin sodium (porcine)       insulin aspart  0-9 Units Subcutaneous TID WC   insulin glargine-yfgn  4 Units Subcutaneous QHS   linaclotide  145 mcg Oral QAC breakfast   pantoprazole  40 mg Oral Daily   predniSONE  20 mg Oral BID WC   senna  2 tablet Oral BID   vitamin B-12  1,000 mcg Oral  Daily   Continuous Infusions:  anticoagulant sodium citrate       LOS: 8 days   Time spent: 68min  Anandi Abramo C Anabelen Kaminsky, DO [facility]  If 7PM-7AM, please contact night-coverage [email or web address]  04/25/2022, 7:21 AM

## 2022-04-25 NOTE — Progress Notes (Signed)
Patient ID: [name], female   DOB: 1966/01/03, 56 y.o.   MRN: [id number] [facility] Progress Note   Assessment/ Plan:   1. Acute kidney Injury: Nonoliguric after holding furosemide.  Etiology appears unclear at this time but not suspected to be related to her history of ANCA associated GPA based on recent rituximab administration in May and preceding corticosteroid use as well as urinalysis not suggestive of GN.  Hemodialysis undertaken overnight for clearance of azotemia/management of uremia that was thought to be contributing to her encephalopathy.  She remains nonoliguric and with robust urine output overnight.  A second hemodialysis treatment has been ordered for today to help continue azotemia management. 2.  Hyponatremia: Secondary to acute kidney injury and impaired free water handling, continue oral fluid restriction and monitor with hemodialysis and potential renal recovery. 3.  Altered mental status: Etiology unclear but seen by neurology who support lumbar puncture to help rule out any encephalitis/meningitis given preceding immunosuppressant use (will need to be done by IR under fluoroscopy as she has an intrathecal pump).  Baclofen pump rate reduced to lowest setting yesterday after likelihood that this was contributing to altered mental status in the setting of acute kidney injury. 4.  Sepsis: Suspected to be of pneumonia/urinary tract infection etiology and previously on antibiotics that are currently on hold. 5.  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  Awaiting transfer to tertiary center for specialized care by rheumatology. 6.  COVID-19 virus infection: Incidental positive COVID PCR found at admission.  On corticosteroids primarily as a continuum of her preceding corticosteroid use.  Did not get remdesivir due to elevated LFTs.  Subjective:   Underwent right IJ hemodialysis catheter  placement by radiology yesterday (their assistance appreciated) followed by hemodialysis overnight.  She currently complains of some right neck pain over catheter insertion site.   Objective:   BP (!) 143/87 (BP Location: Left Arm)   Pulse 91   Temp 98.9 F (37.2 C) (Axillary)   Resp 20   Ht 5\' 5"  (1.651 m)   Wt 104.7 kg   SpO2 95%   BMI 38.41 kg/m   Intake/Output Summary (Last 24 hours) at 04/25/2022 0800 Last data filed at 04/25/2022 0536 Gross per 24 hour  Intake 290 ml  Output 2200 ml  Net -1910 ml   Weight change:   Physical Exam: Gen: Awake, alert and communicating/more lucid than she was yesterday on conversation CVS: Pulse regular rhythm, normal rate, S1 and S2 normal Resp: Coarse breath sounds bilaterally with transmitted sounds Abd: Soft, obese, nontender, bowel sounds normal Ext: Trace lower extremity edema.  Bilateral forefoot amputations noted  Imaging: IR Fluoro Guide CV Line Right  Result Date: 04/24/2022 INDICATION: 56 year old female in need of hemodialysis EXAM: IR ULTRASOUND GUIDANCE VASC ACCESS RIGHT; IR RIGHT FLUORO GUIDE CV LINE MEDICATIONS: None ANESTHESIA/SEDATION: None FLUOROSCOPY TIME:  Radiation Exposure Index: 2 mGy reference air Kerma COMPLICATIONS: None immediate. PROCEDURE: Informed written consent was obtained from the patient after a thorough discussion of the procedural risks, benefits and alternatives. All questions were addressed. Maximal Sterile Barrier Technique was utilized including caps, mask, sterile gowns, sterile gloves, sterile drape, hand hygiene and skin antiseptic. A timeout was performed prior to the initiation of the procedure. The RIGHT internal jugular vein was interrogated with ultrasound and found to be widely patent. An image was obtained and stored for the medical record. Local anesthesia was attained by infiltration with 1%  lidocaine. A small dermatotomy was made. Under real-time sonographic guidance, the vessel was punctured with a  21 gauge micropuncture needle. Using standard technique, the initial micro needle was exchanged over a 0.018 micro wire for a transitional 4 [place] micro sheath. The micro sheath was then exchanged over a 0.035 wire for a fascial dilator and the tract was dilated. A 16 cm non-tunneled hemodialysis catheter was then placed over the wire and into the upper RA. Images were obtained and stored for the medical record. The catheter flushes and aspirates easily. It was flushed, capped and secured to the skin with 0 Prolene suture. Sterile bandages were applied. IMPRESSION: Successful placement of a 16 cm right IJ approach non tunneled hemodialysis catheter. Catheter tip is in the upper right atrium and the device is ready for immediate use. Signed, Sterling Big, MD, RPVI [facility] Radiology Electronically Signed   By: Malachy Moan M.D.   On: 04/24/2022 17:58   IR US Guide Vasc Access Right  Result Date: 04/24/2022 INDICATION: 56 year old female in need of hemodialysis EXAM: IR ULTRASOUND GUIDANCE VASC ACCESS RIGHT; IR RIGHT FLUORO GUIDE CV LINE MEDICATIONS: None ANESTHESIA/SEDATION: None FLUOROSCOPY TIME:  Radiation Exposure Index: 2 mGy reference air Kerma COMPLICATIONS: None immediate. PROCEDURE: Informed written consent was obtained from the patient after a thorough discussion of the procedural risks, benefits and alternatives. All questions were addressed. Maximal Sterile Barrier Technique was utilized including caps, mask, sterile gowns, sterile gloves, sterile drape, hand hygiene and skin antiseptic. A timeout was performed prior to the initiation of the procedure. The RIGHT internal jugular vein was interrogated with ultrasound and found to be widely patent. An image was obtained and stored for the medical record. Local anesthesia was attained by infiltration with 1% lidocaine. A small dermatotomy was made. Under real-time sonographic guidance, the  vessel was punctured with a 21 gauge micropuncture needle. Using standard technique, the initial micro needle was exchanged over a 0.018 micro wire for a transitional 4 [place] micro sheath. The micro sheath was then exchanged over a 0.035 wire for a fascial dilator and the tract was dilated. A 16 cm non-tunneled hemodialysis catheter was then placed over the wire and into the upper RA. Images were obtained and stored for the medical record. The catheter flushes and aspirates easily. It was flushed, capped and secured to the skin with 0 Prolene suture. Sterile bandages were applied. IMPRESSION: Successful placement of a 16 cm right IJ approach non tunneled hemodialysis catheter. Catheter tip is in the upper right atrium and the device is ready for immediate use. Signed, Sterling Big, MD, RPVI [facility] Radiology Electronically Signed   By: Malachy Moan M.D.   On: 04/24/2022 17:58   MR BRAIN WO CONTRAST  Result Date: 04/23/2022 CLINICAL DATA:  Mental status changes. Unknown cause. EXAM: MRI HEAD WITHOUT CONTRAST TECHNIQUE: Multiplanar, multiecho pulse sequences of the brain and surrounding structures were obtained without intravenous contrast. COMPARISON:  CT head 04/17/2022 and 03/26/2022. FINDINGS: Brain: The study is moderately degraded by patient motion. Propeller sequences were utilized. 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 Dilated perivascular spaces are present throughout the basal ganglia. No acute infarct, hemorrhage, or mass lesion is present. White matter changes extend into the thalamus and brainstem. Cerebellum is within normal limits. The internal auditory canals are within normal limits. The ventricles are of normal size. No significant extraaxial fluid collection  is present. Vascular: Flow is present in the major intracranial arteries. Skull and upper cervical spine:  The craniocervical junction is within normal limits. Marrow signal is unremarkable. No focal lesions are evident. Sinuses/Orbits: Mild mucosal thickening is present in the posteroinferior maxillary sinuses bilaterally. A left mastoid effusion is present. No obstructing nasopharyngeal lesion is present. The paranasal sinuses and mastoid air cells are otherwise clear. IMPRESSION: 1. No acute intracranial abnormality. 2. 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 3. Moderate diffuse white matter disease likely reflects the sequela of chronic microvascular ischemia. 4. Left mastoid effusion. No obstructing nasopharyngeal lesion is present. Electronically Signed   By: San Morelle M.D.   On: 04/23/2022 14:52    Labs: BMET Recent Labs  Lab 04/19/22 0333 04/20/22 0311 04/21/22 0522 04/21/22 1242 04/22/22 0336 04/23/22 1015 04/24/22 0816 04/25/22 0022  NA 129* 127*  128* 126*  --  124* 126* 127* 134*  K 5.4* 4.9  4.9 5.1  --  4.9 4.6 5.1 4.0  CL 102 94*  96* 93*  --  90* 89* 90* 94*  CO2 15* 23  22 22   --  17* 18* 17* 24  GLUCOSE 187* 156*  155* 143* 108* 148* 131* 81 100*  BUN 43* 51*  52* 61*  --  67* 83* 101* 62*  CREATININE 3.68*  3.57* 4.37*  4.36* 5.36*  5.38*  --  6.23* 7.59* 8.35* 5.20*  CALCIUM 7.1* 6.6*  6.7* 6.5*  --  6.2* 6.5* 6.3* 7.0*  PHOS 4.7* 5.4* 6.2*  --  7.7* 9.3* 11.2* 6.5*   CBC Recent Labs  Lab 04/20/22 0311 04/22/22 0336 04/24/22 0816 04/25/22 0022  WBC 12.4* 10.9* 13.5* 14.5*  NEUTROABS 10.5*  --   --   --   HGB 8.2* 8.1* 9.0* 8.3*  HCT 25.6* 25.3* 27.9* 25.2*  MCV 82.8 80.3 81.3 80.3  PLT 245 271 271 254    Medications:     Chlorhexidine Gluconate Cloth  6 each Topical Daily   DULoxetine  30 mg Oral Daily   enoxaparin (LOVENOX) injection  110 mg Subcutaneous [id number]   folic acid  2 mg Oral Daily   guaiFENesin-dextromethorphan  10 mL Oral Q12H   heparin sodium (porcine)        insulin aspart  0-9 Units Subcutaneous TID WC   insulin glargine-yfgn  4 Units Subcutaneous QHS   linaclotide  145 mcg Oral QAC breakfast   pantoprazole  40 mg Oral Daily   predniSONE  20 mg Oral BID WC   senna  2 tablet Oral BID   vitamin B-12  1,000 mcg Oral Daily    Elmarie Shiley, MD 04/25/2022, 8:00 AM

## 2022-04-25 NOTE — Progress Notes (Signed)
Received patient in bed, alert and oriented. Informed consent signed and in chart.  Time tx completed:1:41  HD treatment completed. Patient tolerated well. Fistula/Graft/HD catheter without signs and symptoms of complications. Patient transported back to the room, alert and orient and in no acute distress. Report given to bedside RN.  Total UF removed:200 mls  Medication given:none  Post HD VS:see chart  Post HD weight: 104.8 kg  Arterial lumen had elevated pressures that were unresolved. Tx time left was roughly 35 minutes.  Blood returned to pt.  Initial cartridge change did not allow for blood return due to air in lines.  Pt stable.  Upset when family leff for the evening.  Tolerated tx well.    Family not following contact precautions.  Spoke with primary RN about it and she stated the husband refused to follow.  Daughter, husband and son in law visiting, non following contact precaution.

## 2022-04-26 ENCOUNTER — Inpatient Hospital Stay (HOSPITAL_COMMUNITY): Payer: BC Managed Care – PPO

## 2022-04-26 DIAGNOSIS — R6520 Severe sepsis without septic shock: Secondary | ICD-10-CM | POA: Diagnosis not present

## 2022-04-26 DIAGNOSIS — A419 Sepsis, unspecified organism: Secondary | ICD-10-CM | POA: Diagnosis not present

## 2022-04-26 LAB — HEPATITIS B SURFACE ANTIBODY, QUANTITATIVE: Hep B S AB Quant (Post): 3.2 m[IU]/mL — ABNORMAL LOW (ref >9.9)

## 2022-04-26 LAB — CBC
HCT: 23.6 % — ABNORMAL LOW (ref 36.0–46.0)
Hemoglobin: 7.5 g/dL — ABNORMAL LOW (ref 12.0–15.0)
MCH: 26 pg (ref 26.0–34.0)
MCHC: 31.8 g/dL (ref 30.0–36.0)
MCV: 81.9 fL (ref 80.0–100.0)
Platelets: 203 10*3/uL (ref 150–400)
RBC: 2.88 MIL/uL — ABNORMAL LOW (ref 3.87–5.11)
RDW: 21.2 % — ABNORMAL HIGH (ref 11.5–15.5)
WBC: 12.5 10*3/uL — ABNORMAL HIGH (ref 4.0–10.5)
nRBC: 0 % (ref 0.0–0.2)

## 2022-04-26 LAB — PROTEIN ELECTROPHORESIS, SERUM
A/G Ratio: 1 (ref 0.7–1.7)
Albumin ELP: 2.5 g/dL — ABNORMAL LOW (ref 2.9–4.4)
Alpha-1-Globulin: 0.4 g/dL (ref 0.0–0.4)
Alpha-2-Globulin: 1.1 g/dL — ABNORMAL HIGH (ref 0.4–1.0)
Beta Globulin: 0.7 g/dL (ref 0.7–1.3)
Gamma Globulin: 0.3 g/dL — ABNORMAL LOW (ref 0.4–1.8)
Globulin, Total: 2.4 g/dL (ref 2.2–3.9)
Total Protein ELP: 4.9 g/dL — ABNORMAL LOW (ref 6.0–8.5)

## 2022-04-26 LAB — HEPATIC FUNCTION PANEL
ALT: 54 U/L — ABNORMAL HIGH (ref 0–44)
AST: 72 U/L — ABNORMAL HIGH (ref 15–41)
Albumin: 1.8 g/dL — ABNORMAL LOW (ref 3.5–5.0)
Alkaline Phosphatase: 56 U/L (ref 38–126)
Bilirubin, Direct: <0.1 mg/dL (ref 0.0–0.2)
Total Bilirubin: 1.1 mg/dL (ref 0.3–1.2)
Total Protein: 4.4 g/dL — ABNORMAL LOW (ref 6.5–8.1)

## 2022-04-26 LAB — RENAL FUNCTION PANEL
Albumin: 1.8 g/dL — ABNORMAL LOW (ref 3.5–5.0)
Anion gap: 18 — ABNORMAL HIGH (ref 5–15)
BUN: 64 mg/dL — ABNORMAL HIGH (ref 6–20)
CO2: 24 mmol/L (ref 22–32)
Calcium: 6.9 mg/dL — ABNORMAL LOW (ref 8.9–10.3)
Chloride: 94 mmol/L — ABNORMAL LOW (ref 98–111)
Creatinine, Ser: 5.45 mg/dL — ABNORMAL HIGH (ref 0.44–1.00)
GFR, Estimated: 9 mL/min — ABNORMAL LOW (ref >60)
Glucose, Bld: 84 mg/dL (ref 70–99)
Phosphorus: 7.4 mg/dL — ABNORMAL HIGH (ref 2.5–4.6)
Potassium: 4 mmol/L (ref 3.5–5.1)
Sodium: 136 mmol/L (ref 135–145)

## 2022-04-26 LAB — RHEUMATOID FACTOR: Rheumatoid fact SerPl-aCnc: 12 IU/mL (ref <14.0)

## 2022-04-26 LAB — CK: Total CK: 2171 U/L — ABNORMAL HIGH (ref 38–234)

## 2022-04-26 LAB — GLUCOSE, CAPILLARY
Glucose-Capillary: 75 mg/dL (ref 70–99)
Glucose-Capillary: 88 mg/dL (ref 70–99)
Glucose-Capillary: 135 mg/dL — ABNORMAL HIGH (ref 70–99)
Glucose-Capillary: 114 mg/dL — ABNORMAL HIGH (ref 70–99)

## 2022-04-26 LAB — ANCA PROFILE
Anti-MPO Antibodies: 1.8 units — ABNORMAL HIGH (ref 0.0–0.9)
Anti-PR3 Antibodies: <0.2 units (ref 0.0–0.9)
Atypical P-ANCA titer: <1:20 {titer}
C-ANCA: <1:20 {titer}
P-ANCA: <1:20 {titer}

## 2022-04-26 MED ORDER — LOPERAMIDE HCL 1 MG/7.5ML PO SUSP
2.0000 mg | ORAL | Status: DC | PRN
Start: 2022-04-26 — End: 2022-05-18
  Administered 2022-04-26: 2 mg via ORAL
  Filled 2022-04-26 (×2): qty 15

## 2022-04-26 MED ORDER — LIP MEDEX EX OINT: 1.0000 | TOPICAL_OINTMENT | CUTANEOUS | Status: DC | PRN

## 2022-04-26 NOTE — Progress Notes (Signed)
Patient ID: [name], female   DOB: 22-Feb-1966, 56 y.o.   MRN: [id number] [facility] Progress Note   Assessment/ Plan:   1. Acute kidney Injury: Nonoliguric after holding furosemide.  Etiology appears unclear at this time but not suspected to be related to her history of ANCA associated GPA based on recent rituximab administration in May and preceding corticosteroid use as well as urinalysis not suggestive of GN.  Started on hemodialysis for management of severe azotemia that was thought to be compounding her encephalopathy with subsequent improvement of altered mentation.  Yesterday dialysis treatment was cut short due to problems with alarms/catheter position and will be reattempted again today with no ultrafiltration as she remains nonoliguric and without volume overload. 2.  Hyponatremia: Secondary to acute kidney injury and impaired free water handling, continue oral fluid restriction and improving with hemodialysis. 3.  Altered mental status: Etiology unclear but seen by neurology who support lumbar puncture to help rule out any encephalitis/meningitis given preceding immunosuppressant use (will need to be done by IR under fluoroscopy as she has an intrathecal pump).  Encephalopathy improving with hemodialysis and decreased baclofen rate. 4.  Sepsis: Suspected to be of pneumonia/urinary tract infection etiology and previously on antibiotics that are currently on hold. 5.  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  Awaiting transfer to tertiary center for specialized care by rheumatology. 6.  COVID-19 virus infection: Incidental positive COVID PCR found at admission.  On corticosteroids primarily as a continuum of her preceding corticosteroid use.  Did not get remdesivir due to elevated LFTs.  Subjective:   Reports intermittent discomfort over right IJ hemodialysis catheter site.  Yesterday catheter noted  to be positional during hemodialysis and with some bleeding around insertion site needing dressing change.   Objective:   BP 115/81 (BP Location: Left Arm)   Pulse 97   Temp 98.8 F (37.1 C) (Oral)   Resp 20   Ht 5\' 5"  (1.651 m)   Wt 104.6 kg   SpO2 97%   BMI 38.37 kg/m   Intake/Output Summary (Last 24 hours) at 04/26/2022 0809 Last data filed at 04/26/2022 0341 Gross per 24 hour  Intake 230 ml  Output 1700 ml  Net -1470 ml   Weight change: -0.6 kg  Physical Exam: Gen: Awake, alert, resting comfortably in bed.  Able to appropriately respond to questions. CVS: Pulse regular rhythm, normal rate, S1 and S2 normal Resp: Coarse breath sounds bilaterally with transmitted sounds Abd: Soft, obese, nontender, bowel sounds normal Ext: Trace lower extremity edema.  Bilateral forefoot amputations noted  Imaging: IR Fluoro Guide CV Line Right  Result Date: 04/24/2022 INDICATION: 56 year old female in need of hemodialysis EXAM: IR ULTRASOUND GUIDANCE VASC ACCESS RIGHT; IR RIGHT FLUORO GUIDE CV LINE MEDICATIONS: None ANESTHESIA/SEDATION: None FLUOROSCOPY TIME:  Radiation Exposure Index: 2 mGy reference air Kerma COMPLICATIONS: None immediate. PROCEDURE: Informed written consent was obtained from the patient after a thorough discussion of the procedural risks, benefits and alternatives. All questions were addressed. Maximal Sterile Barrier Technique was utilized including caps, mask, sterile gowns, sterile gloves, sterile drape, hand hygiene and skin antiseptic. A timeout was performed prior to the initiation of the procedure. The RIGHT internal jugular vein was interrogated with ultrasound and found to be widely patent. An image was obtained and stored for the medical record. Local anesthesia was attained by infiltration with 1% lidocaine. A small dermatotomy was made. Under real-time sonographic guidance, the  vessel was punctured with a 21 gauge micropuncture needle. Using standard technique, the  initial micro needle was exchanged over a 0.018 micro wire for a transitional 4 [place] micro sheath. The micro sheath was then exchanged over a 0.035 wire for a fascial dilator and the tract was dilated. A 16 cm non-tunneled hemodialysis catheter was then placed over the wire and into the upper RA. Images were obtained and stored for the medical record. The catheter flushes and aspirates easily. It was flushed, capped and secured to the skin with 0 Prolene suture. Sterile bandages were applied. IMPRESSION: Successful placement of a 16 cm right IJ approach non tunneled hemodialysis catheter. Catheter tip is in the upper right atrium and the device is ready for immediate use. Signed, Sterling Big, MD, RPVI [facility] Radiology Electronically Signed   By: Malachy Moan M.D.   On: 04/24/2022 17:58   IR US Guide Vasc Access Right  Result Date: 04/24/2022 INDICATION: 56 year old female in need of hemodialysis EXAM: IR ULTRASOUND GUIDANCE VASC ACCESS RIGHT; IR RIGHT FLUORO GUIDE CV LINE MEDICATIONS: None ANESTHESIA/SEDATION: None FLUOROSCOPY TIME:  Radiation Exposure Index: 2 mGy reference air Kerma COMPLICATIONS: None immediate. PROCEDURE: Informed written consent was obtained from the patient after a thorough discussion of the procedural risks, benefits and alternatives. All questions were addressed. Maximal Sterile Barrier Technique was utilized including caps, mask, sterile gowns, sterile gloves, sterile drape, hand hygiene and skin antiseptic. A timeout was performed prior to the initiation of the procedure. The RIGHT internal jugular vein was interrogated with ultrasound and found to be widely patent. An image was obtained and stored for the medical record. Local anesthesia was attained by infiltration with 1% lidocaine. A small dermatotomy was made. Under real-time sonographic guidance, the vessel was punctured with a 21 gauge micropuncture needle.  Using standard technique, the initial micro needle was exchanged over a 0.018 micro wire for a transitional 4 [place] micro sheath. The micro sheath was then exchanged over a 0.035 wire for a fascial dilator and the tract was dilated. A 16 cm non-tunneled hemodialysis catheter was then placed over the wire and into the upper RA. Images were obtained and stored for the medical record. The catheter flushes and aspirates easily. It was flushed, capped and secured to the skin with 0 Prolene suture. Sterile bandages were applied. IMPRESSION: Successful placement of a 16 cm right IJ approach non tunneled hemodialysis catheter. Catheter tip is in the upper right atrium and the device is ready for immediate use. Signed, Sterling Big, MD, RPVI [facility] Radiology Electronically Signed   By: Malachy Moan M.D.   On: 04/24/2022 17:58    Labs: BMET Recent Labs  Lab 04/20/22 0311 04/21/22 0522 04/21/22 1242 04/22/22 0336 04/23/22 1015 04/24/22 0816 04/25/22 0022 04/26/22 0350  NA 127*  128* 126*  --  124* 126* 127* 134* 136  K 4.9  4.9 5.1  --  4.9 4.6 5.1 4.0 4.0  CL 94*  96* 93*  --  90* 89* 90* 94* 94*  CO2 23  22 22   --  17* 18* 17* 24 24  GLUCOSE 156*  155* 143* 108* 148* 131* 81 100* 84  BUN 51*  52* 61*  --  67* 83* 101* 62* 64*  CREATININE 4.37*  4.36* 5.36*  5.38*  --  6.23* 7.59* 8.35* 5.20* 5.45*  CALCIUM 6.6*  6.7* 6.5*  --  6.2* 6.5* 6.3* 7.0* 6.9*  PHOS  5.4* 6.2*  --  7.7* 9.3* 11.2* 6.5* 7.4*   CBC Recent Labs  Lab 04/20/22 0311 04/22/22 0336 04/24/22 0816 04/25/22 0022 04/26/22 0350  WBC 12.4* 10.9* 13.5* 14.5* 12.5*  NEUTROABS 10.5*  --   --   --   --   HGB 8.2* 8.1* 9.0* 8.3* 7.5*  HCT 25.6* 25.3* 27.9* 25.2* 23.6*  MCV 82.8 80.3 81.3 80.3 81.9  PLT 245 271 271 254 203    Medications:     Chlorhexidine Gluconate Cloth  6 each Topical Daily   DULoxetine  30 mg Oral Daily   enoxaparin (LOVENOX)  injection  110 mg Subcutaneous Q24H   folic acid  2 mg Oral Daily   guaiFENesin-dextromethorphan  10 mL Oral Q12H   insulin aspart  0-9 Units Subcutaneous TID WC   insulin glargine-yfgn  4 Units Subcutaneous QHS   linaclotide  145 mcg Oral QAC breakfast   pantoprazole  40 mg Oral Daily   predniSONE  20 mg Oral BID WC   senna  2 tablet Oral BID   vitamin B-12  1,000 mcg Oral Daily    Zetta Bills, MD 04/26/2022, 8:09 AM

## 2022-04-26 NOTE — Progress Notes (Addendum)
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  Patient presents with worsening intermittent altered mental status and increased weakness.  She has been declining since approximately January, 6 months ago per husband.  She was recently admitted to our facility and treated for pneumonia with moderate improvement.  She was to continue on vancomycin through 04/08/2022 followed by infectious disease.  Bronchoscopy during that admission on 03/30/2022 shows Candida in the setting of ongoing antibiotics and urine culture ultimately grew yeast - deemed to be colonized and not active infection given fungitel was reassuringly low per ID discussion. Of note she is also had a recent diagnosis of pulmonary embolism on apixaban.  Patient admitted this hospitalization (on 04/17/22) with elevated white count, lactic acid, and acute renal failure in the setting of poor p.o. intake altered mental status tachycardia and tachypnea concerning for sepsis. Patient's infectious source was presumed to be pneumonia versus UTI - placed initially on broad-spectrum antibiotics of vancomycin and cefepime. Her sepsis criteria did not improve and her renal function continued to worsen in the setting of presumed sepsis and rhabdomyolysis (ATN). Nephrology following with worsening mental status, tremors/muscle spasms with metabolic derangements. Patient transferred to main campus for neurology evaluation, EEG ultimately unremarkable for seizures-her movement was considered to be musculoskeletal, somewhat choreiform in nature, holding off on LP per neuro given improvements with HD and intrathecal pump turned down (unable to be turned off but can be turned to  [facility]).  Assessment & Plan:   Principal Problem:   Severe sepsis ([place]) Active Problems:   Acute cystitis without hematuria   Current chronic use of systemic steroids   Chronic pain   Secondary adrenal insufficiency (HCC)   AKI (acute kidney injury) ([name])   Granulomatosis with polyangiitis (HCC)   GERD (gastroesophageal reflux disease)   Essential hypertension   Obesity (BMI 30-39.9)   COVID-19 virus infection   Acute metabolic encephalopathy   Pulmonary embolism (HCC)   Transaminitis   Rhabdomyolysis  Acute metabolic encephalopathy, POA, minimally improving Multifactorial - Initially presumed to be secondary to sepsis/renal failure/hyponatremia  - Potential covid encephalitis, however spastic choreiform movements are not consistent with COVID encephalitis - Mental status improving moderately over the past 48 hours after initiating dialysis and discontinuation of intrathecal pain pump - Discontinue antibiotics as below, monitor for CNS depressants - MRI unremarkable for acute process - Consider CTA head for vasculitis workup per Rheum if she does not continue to improve over the next few days. - Neuro consulted, EEG unremarkable for seizure - we discussed potential need for LP (continue holding for now given improvement in symptoms). - Nephrology following along, appreciate insight recommendations - Discontinued all home CNS depressant medications - continued Cymbalta only  Polypharmacy risk -Home medications include bupivacaine/zinconotide intrathecal pump, duloxetine, gabapentin, methocarbamol, nucynta, oxycodone, Topamax   Chronic pain Chronic opioid dependence ([name]) - Patient has intrathecal pump with bupivacaine and ziconotide - turned down to lowest setting 7/3 (cannot stop completely per tech) - Pain poorly controlled today' now that patient is more awake - will continue narcotic holiday - will consider restarting low dose PO oxy vs nucynta in the next 24-48h if mental  status stabilizes  Granulomatosis with polyangiitis ([name]) - Patient receiving rituximab and methotrexate as an outpatient (most recently at [name] in May 2023) -  confirmed with rheumatology at [name] (Dr. Dimple [name]) - Continue prednisone 20bid as below - Patient has had somewhat splintered medical care in the setting of rheumatology due to her complex disease state, plan to follow-up at [name] (new visit) but has been too unstable requiring multiple hospital admissions and has not yet been able to follow-up at this facility. - Discussed case with Dr. Dimple [name] rheumatology at [name], pending imaging of the brain to confirm potential CNS vasculitis he would be willing to accept the patient to their service at [facility] for closer monitoring and to further discuss more novel/off label treatment as indicated -patient's husband would prefer transfer to [name] if possible- MRI unrevealing. Consider CTA(once HD established) to evaluate per rheum recommendations - Nephrology sent off multiple biomarkers: *Of note C4, and Kappa light chains* were elevated minimally but otherwise ANA, anti-dsDNA, c3, spep/sflc without abnormalities, hepatitis panel, HIV were unremarkable - ANCA, cryos, CMB, EBV still pending  AKI (acute kidney injury) (HCC)/ATN currently requiring HD Anion gap metabolic acidosis in the setting of uremia - Complicated and multifactorial etiology -possibly ATN per discussion with nephrology - Temp HD catheter initially placed 04/24/2022 - issues with bleeding ongoing - Nephro/IR to evaluate for dressing/suture replacement - UOP continues to be appropriate despite continued renal dysfunction  Acute hypoxic respiratory failure, POA -Multifactorial in the setting of metabolic encephalopathy, presumed pneumonia, granulomatosis/polyangiitis, concurrent COVID-19 infection -Potentially exacerbated by hypervolemia surrounding renal failure -Wean oxygen as tolerated - on room air at baseline  Presumed  severe sepsis secondary to pneumonia versus UTI, resolved (HCC) -Presumed at intake in the setting of leukocytosis tachycardia and low-grade temperature -Unfortunately procalcitonin can be falsely positive in vasculitis/renal dysfunction -Patient was presumed to have a UTI versus pneumonia given her history, has now completed 5 days of antibiotics - discussed with ID - discontinued 04/23/22 -C-diff positive but she is colonized (see micro history) and toxin is negative - soft stool over past 24h (history of IBS) - WBC stable/downtrending - no indication for antibiotic treatment at this time -Previous candida per bronch and yeast in urine likely contaminant in the setting of prolonged antibiotics and colonization per ID  Auditory hallucinations, ongoing - Patient able to convey ROS today for the first time since admission - Admits to hearing voices (unintelligible/background murmurs) likely secondary to bupivacaine toxicity in the setting of renal insufficiency and intrathecal pump  Secondary adrenal insufficiency (HCC) -Continue steroids    Current chronic use of systemic steroids -5 mg of prednisone on daily basis at baseline -Solu-Cortef x1 given while in the ED at time of admission. -Continue higher dose of prednisone 20 mg bid -may decrease steroid regimen in the next 24 to 48 hours given questionable steroid-induced psychosis however this would be less likely etiology given above  GERD (gastroesophageal reflux disease) -Continue PPI.   Essential hypertension -Currently well controlled off home regimen, continue to follow clinically  Rhabdomyolysis, improving -Secondary to prolonged downtime -CPK downtrending appropriately in the setting of IV fluids (now discontinued) -Continue volume management with dialysis -Defer to nephrology for ongoing fluids and diuretics   Elevated AST/ALT, resolving -Most likely in the setting of sepsis/shock liver and rhabdomyolysis. -Downtrending  appropriately -Avoid hepatotoxic medications   Pulmonary embolism (HCC), POA -Somewhat new diagnosis, diagnosed during previous hospitalization 03/27/2022  -Transition from eliquis to lovenox (given possible LP and recent HD line placement)  COVID-19 virus infection, questionably incidental - COVID PCR incidentally positive at time of admission -without respiratory symptoms - Inflammatory markers suggesting bacterial  infection and very likely component of underlying rheumatologic condition. - Procalcitonin elevated, outlined as above this can be falsely positive and vasculitis in ESRD patients, treated for UTI/PNA at intake (completed). - Steroids ongoing as above - Remdesivir withheld due to elevated liver enzymes - Will complete quarantine 04/27/22   Obesity (BMI 30-39.9) Estimated body mass index is 38.37 kg/m as calculated from the following:   Height as of this encounter: 5\' 5"  (1.651 m).   Weight as of this encounter: 104.6 kg.    IBS/Diarrhea History of C-diff, colonized, likely POA - Transient episodes of loose stool/diarrhea while unable to take home linzess - GI panel unremarkable - Cdiff Ag positive, toxin negative -in the setting of distant C. difficile infection  - no indication for antibiotic treatment as symptoms are improving, white count downtrending - Imodium PRN loose stools given non-infectious nature, restart linzess  Chemotherapy-induced peripheral neuropathy (HCC) At home--is on gabapentin, Cymbalta, amitriptyline topiramate, trazodone, nucynta--all have been held given worsening mental status   DVT prophylaxis: lovenox (last eliquis dose 7/2 am) Code Status: Full Family Communication: Husband updated at length over the phone  Status is: Inpatient  Dispo: The patient is from: Home              Anticipated d/c is to: To be determined              Anticipated d/c date is: To be determined              Patient currently not medically stable for  discharge  Consultants:  Nephro, neuro  Procedures:  None  Antimicrobials:  Discontinued 04/22/2022  Subjective: No acute issues or events overnight, mental status markedly improving, patient's review of systems limited but cannot report diffuse pain, most notably abdominal cramping in setting of ongoing diarrhea but denies any nausea or vomiting headache fevers or chills  Objective: Vitals:   04/25/22 1810 04/25/22 1944 04/25/22 2353 04/26/22 0339  BP: (!) 142/87 (!) 143/86 (!) 146/86 115/81  Pulse:  98 94 97  Resp: (!) 28 16 18 20   Temp: 98.7 F (37.1 C) 98.8 F (37.1 C) 98.5 F (36.9 C) 98.8 F (37.1 C)  TempSrc: Oral Oral Oral Oral  SpO2:  96% 98% 97%  Weight:      Height:        Intake/Output Summary (Last 24 hours) at 04/26/2022 0709 Last data filed at 04/26/2022 0341 Gross per 24 hour  Intake 350 ml  Output 1700 ml  Net -1350 ml    Filed Weights   04/24/22 2130 04/25/22 0050 04/25/22 1509  Weight: 105.2 kg 104.7 kg 104.6 kg    Examination:  General:  Pleasantly resting in bed, No acute distress. HEENT:  Normocephalic atraumatic.  Sclerae nonicteric, noninjected.  Extraocular movements intact bilaterally. Neck:  Without mass or deformity.  Trachea is midline. Lungs: Diminished bilaterally with scant inspiratory and expiratory wheezes secondary to upper airway anatomy. Heart:  Regular rate and rhythm.  Without murmurs, rubs, or gallops. Abdomen:  Soft, nontender, nondistended.  Without guarding or rebound. Neuro: Awake alert oriented to person and place, follows simple commands Vascular:  Dorsalis pedis and posterior tibial pulses palpable bilaterally. Skin:  Warm and dry, no erythema, bilateral foot postoperative changes from previous transmetatarsal amputations.  Data Reviewed: I have personally reviewed following labs and imaging studies  CBC: Recent Labs  Lab 04/20/22 0311 04/22/22 0336 04/24/22 0816 04/25/22 0022 04/26/22 0350  WBC 12.4* 10.9*  13.5* 14.5* 12.5*  NEUTROABS 10.5*  --   --   --   --  HGB 8.2* 8.1* 9.0* 8.3* 7.5*  HCT 25.6* 25.3* 27.9* 25.2* 23.6*  MCV 82.8 80.3 81.3 80.3 81.9  PLT 245 271 271 254 [id number]    Basic Metabolic Panel: Recent Labs  Lab 04/22/22 0336 04/23/22 1015 04/24/22 0816 04/25/22 0022 04/26/22 0350  NA 124* 126* 127* 134* 136  K 4.9 4.6 5.1 4.0 4.0  CL 90* 89* 90* 94* 94*  CO2 17* 18* 17* 24 24  GLUCOSE 148* 131* 81 100* 84  BUN 67* 83* 101* 62* 64*  CREATININE 6.23* 7.59* 8.35* 5.20* 5.45*  CALCIUM 6.2* 6.5* 6.3* 7.0* 6.9*  PHOS 7.7* 9.3* 11.2* 6.5* 7.4*    GFR: Estimated Creatinine Clearance: 13.8 mL/min (A) (by C-G formula based on SCr of 5.45 mg/dL (H)). Liver Function Tests: Recent Labs  Lab 04/20/22 0311 04/21/22 0522 04/22/22 0336 04/22/22 2031 04/23/22 1015 04/24/22 0816 04/25/22 0022 04/26/22 0350  AST 438*  --  162* 122*  --   --  95* 72*  ALT 203*  --  140* 120*  --   --  77* 54*  ALKPHOS 80  --  69 73  --   --  67 56  BILITOT 0.6  --  0.9 1.0  --   --  0.7 1.1  PROT 4.5*  --  4.6* 4.6*  --   --  5.0* 4.4*  ALBUMIN 1.9*  1.9*   < > 1.8*  1.8* 1.8* 2.0* 1.9* 1.9*  1.9* 1.8*  1.8*   < > = values in this interval not displayed.    No results for input(s): "LIPASE", "AMYLASE" in the last 168 hours. Recent Labs  Lab 04/20/22 1411  AMMONIA 14    Coagulation Profile: No results for input(s): "INR", "PROTIME" in the last 168 hours.  Cardiac Enzymes: Recent Labs  Lab 04/22/22 0336 04/23/22 1015 04/24/22 0816 04/25/22 0022 04/26/22 0350  CKTOTAL 4,496* 4,061* 4,049* 3,558* 2,171*    BNP (last 3 results) No results for input(s): "PROBNP" in the last 8760 hours. HbA1C: No results for input(s): "HGBA1C" in the last 72 hours. CBG: Recent Labs  Lab 04/25/22 0849 04/25/22 1223 04/25/22 1733 04/25/22 1938 04/26/22 0643  GLUCAP 77 97 109* 117* 75    Lipid Profile: No results for input(s): "CHOL", "HDL", "LDLCALC", "TRIG", "CHOLHDL", "LDLDIRECT"  in the last 72 hours. Thyroid Function Tests: No results for input(s): "TSH", "T4TOTAL", "FREET4", "T3FREE", "THYROIDAB" in the last 72 hours.  Anemia Panel: No results for input(s): "VITAMINB12", "FOLATE", "FERRITIN", "TIBC", "IRON", "RETICCTPCT" in the last 72 hours.  Sepsis Labs: Recent Labs  Lab 04/20/22 0311  PROCALCITON 2.95     Recent Results (from the past 240 hour(s))  Culture, blood (Routine x 2)     Status: None   Collection Time: 04/17/22 11:25 AM   Specimen: Left Antecubital; Blood  Result Value Ref Range Status   Specimen Description LEFT ANTECUBITAL  Final   Special Requests   Final    BOTTLES DRAWN AEROBIC AND ANAEROBIC Blood Culture results may not be optimal due to an inadequate volume of blood received in culture bottles   Culture   Final    NO GROWTH 5 DAYS Performed at [name], [address] [place], [place] [address]    Report Status 04/22/2022 FINAL  Final  Culture, blood (Routine x 2)     Status: None   Collection Time: 04/17/22 11:34 AM   Specimen: BLOOD LEFT ARM  Result Value Ref Range Status   Specimen Description BLOOD LEFT ARM  Final   Special Requests   Final    BOTTLES DRAWN AEROBIC AND ANAEROBIC Blood Culture adequate volume   Culture   Final    NO GROWTH 5 DAYS Performed at [name], [address] [place], [place] [address]    Report Status 04/22/2022 FINAL  Final  Urine Culture     Status: Abnormal   Collection Time: 04/17/22 11:53 AM   Specimen: Urine, Catheterized  Result Value Ref Range Status   Specimen Description   Final    URINE, CATHETERIZED Performed at [name], [address], [place], [place] [address]    Special Requests   Final    NONE Performed at [name], [address] [place], [place] [address]    Culture >=100,000 COLONIES/mL YEAST (A)  Final   Report Status 04/19/2022 FINAL  Final  Resp Panel by RT-PCR (Flu A&B, Covid) Anterior Nasal Swab     Status: Abnormal   Collection Time: 04/17/22  11:57 AM   Specimen: Anterior Nasal Swab  Result Value Ref Range Status   SARS Coronavirus 2 by RT PCR POSITIVE (A) NEGATIVE Final    Comment: (NOTE) SARS-CoV-2 target nucleic acids are DETECTED.  The SARS-CoV-2 RNA is generally detectable in upper respiratory specimens during the acute phase of infection. Positive results are indicative of the presence of the identified virus, but do not rule out bacterial infection or co-infection with other pathogens not detected by the test. Clinical correlation with patient history and other diagnostic information is necessary to determine patient infection status. The expected result is Negative.  Fact Sheet for Patients: EntrepreneurPulse.com.au  Fact Sheet for Healthcare Providers: IncredibleEmployment.be  This test is not yet approved or cleared by the [place] FDA and  has been authorized for detection and/or diagnosis of SARS-CoV-2 by FDA under an Emergency Use Authorization (EUA).  This EUA will remain in effect (meaning this test can be used) for the duration of  the COVID-19 declaration under Section 564(b)(1) of the A ct, 21 U.S.C. section 360bbb-3(b)(1), unless the authorization is terminated or revoked sooner.     Influenza A by PCR NEGATIVE NEGATIVE Final   Influenza B by PCR NEGATIVE NEGATIVE Final    Comment: (NOTE) The Xpert Xpress SARS-CoV-2/FLU/RSV plus assay is intended as an aid in the diagnosis of influenza from Nasopharyngeal swab specimens and should not be used as a sole basis for treatment. Nasal washings and aspirates are unacceptable for Xpert Xpress SARS-CoV-2/FLU/RSV testing.  Fact Sheet for Patients: EntrepreneurPulse.com.au  Fact Sheet for Healthcare Providers: IncredibleEmployment.be  This test is not yet approved or cleared by the [place] FDA and has been authorized for detection and/or diagnosis of SARS-CoV-2 by FDA  under an Emergency Use Authorization (EUA). This EUA will remain in effect (meaning this test can be used) for the duration of the COVID-19 declaration under Section 564(b)(1) of the Act, 21 U.S.C. section 360bbb-3(b)(1), unless the authorization is terminated or revoked.  Performed at [name], [address], [place], [place] [address]   Gastrointestinal Panel by PCR , Stool     Status: None   Collection Time: 04/23/22  3:55 PM   Specimen: Stool  Result Value Ref Range Status   Campylobacter species NOT DETECTED NOT DETECTED Final   Plesimonas shigelloides NOT DETECTED NOT DETECTED Final   Salmonella species NOT DETECTED NOT DETECTED Final   Yersinia enterocolitica NOT DETECTED NOT DETECTED Final   Vibrio species NOT DETECTED NOT DETECTED Final   Vibrio cholerae NOT DETECTED  NOT DETECTED Final   Enteroaggregative E coli (EAEC) NOT DETECTED NOT DETECTED Final   Enteropathogenic E coli (EPEC) NOT DETECTED NOT DETECTED Final   Enterotoxigenic E coli (ETEC) NOT DETECTED NOT DETECTED Final   Shiga like toxin producing E coli (STEC) NOT DETECTED NOT DETECTED Final   Shigella/Enteroinvasive E coli (EIEC) NOT DETECTED NOT DETECTED Final   Cryptosporidium NOT DETECTED NOT DETECTED Final   Cyclospora cayetanensis NOT DETECTED NOT DETECTED Final   Entamoeba histolytica NOT DETECTED NOT DETECTED Final   Giardia lamblia NOT DETECTED NOT DETECTED Final   Adenovirus F40/41 NOT DETECTED NOT DETECTED Final   Astrovirus NOT DETECTED NOT DETECTED Final   Norovirus GI/GII NOT DETECTED NOT DETECTED Final   Rotavirus A NOT DETECTED NOT DETECTED Final   Sapovirus (I, II, IV, and V) NOT DETECTED NOT DETECTED Final    Comment: Performed at [name], [address] [place], [place] [address]  C Difficile Quick Screen (NO PCR Reflex)     Status: Abnormal   Collection Time: 04/23/22  3:57 PM   Specimen: Stool  Result Value Ref Range Status   C Diff antigen POSITIVE (A) NEGATIVE Final    C Diff toxin NEGATIVE NEGATIVE Final   C Diff interpretation   Final    Results are indeterminate. Please contact the provider listed for your campus for C diff questions in AMION.    Comment: Performed at [name], [address], [place], [place] [address]    Radiology Studies: IR Fluoro Guide CV Line Right  Result Date: 04/24/2022 INDICATION: 56 year old female in need of hemodialysis EXAM: IR ULTRASOUND GUIDANCE VASC ACCESS RIGHT; IR RIGHT FLUORO GUIDE CV LINE MEDICATIONS: None ANESTHESIA/SEDATION: None FLUOROSCOPY TIME:  Radiation Exposure Index: 2 mGy reference air Kerma COMPLICATIONS: None immediate. PROCEDURE: Informed written consent was obtained from the patient after a thorough discussion of the procedural risks, benefits and alternatives. All questions were addressed. Maximal Sterile Barrier Technique was utilized including caps, mask, sterile gowns, sterile gloves, sterile drape, hand hygiene and skin antiseptic. A timeout was performed prior to the initiation of the procedure. The RIGHT internal jugular vein was interrogated with ultrasound and found to be widely patent. An image was obtained and stored for the medical record. Local anesthesia was attained by infiltration with 1% lidocaine. A small dermatotomy was made. Under real-time sonographic guidance, the vessel was punctured with a 21 gauge micropuncture needle. Using standard technique, the initial micro needle was exchanged over a 0.018 micro wire for a transitional 4 [place] micro sheath. The micro sheath was then exchanged over a 0.035 wire for a fascial dilator and the tract was dilated. A 16 cm non-tunneled hemodialysis catheter was then placed over the wire and into the upper RA. Images were obtained and stored for the medical record. The catheter flushes and aspirates easily. It was flushed, capped and secured to the skin with 0 Prolene suture. Sterile bandages were applied. IMPRESSION: Successful placement of a 16 cm  right IJ approach non tunneled hemodialysis catheter. Catheter tip is in the upper right atrium and the device is ready for immediate use. Signed, Sterling Big, MD, [facility] Electronically Signed   By: Malachy Moan M.D.   On: 04/24/2022 17:58   IR US Guide Vasc Access Right  Result Date: 04/24/2022 INDICATION: 56 year old female in need of hemodialysis EXAM: IR ULTRASOUND GUIDANCE VASC ACCESS RIGHT; IR RIGHT FLUORO GUIDE CV LINE MEDICATIONS: None ANESTHESIA/SEDATION: None FLUOROSCOPY  TIME:  Radiation Exposure Index: 2 mGy reference air Kerma COMPLICATIONS: None immediate. PROCEDURE: Informed written consent was obtained from the patient after a thorough discussion of the procedural risks, benefits and alternatives. All questions were addressed. Maximal Sterile Barrier Technique was utilized including caps, mask, sterile gowns, sterile gloves, sterile drape, hand hygiene and skin antiseptic. A timeout was performed prior to the initiation of the procedure. The RIGHT internal jugular vein was interrogated with ultrasound and found to be widely patent. An image was obtained and stored for the medical record. Local anesthesia was attained by infiltration with 1% lidocaine. A small dermatotomy was made. Under real-time sonographic guidance, the vessel was punctured with a 21 gauge micropuncture needle. Using standard technique, the initial micro needle was exchanged over a 0.018 micro wire for a transitional 4 [place] micro sheath. The micro sheath was then exchanged over a 0.035 wire for a fascial dilator and the tract was dilated. A 16 cm non-tunneled hemodialysis catheter was then placed over the wire and into the upper RA. Images were obtained and stored for the medical record. The catheter flushes and aspirates easily. It was flushed, capped and secured to the skin with 0 Prolene suture. Sterile bandages were applied. IMPRESSION:  Successful placement of a 16 cm right IJ approach non tunneled hemodialysis catheter. Catheter tip is in the upper right atrium and the device is ready for immediate use. Signed, Criselda Peaches, MD, [place] [name] Electronically Signed   By: Jacqulynn Cadet M.D.   On: 04/24/2022 17:58    Scheduled Meds:  Chlorhexidine Gluconate Cloth  6 each Topical Daily   DULoxetine  30 mg Oral Daily   enoxaparin (LOVENOX) injection  110 mg Subcutaneous [id number]   folic acid  2 mg Oral Daily   guaiFENesin-dextromethorphan  10 mL Oral Q12H   insulin aspart  0-9 Units Subcutaneous TID WC   insulin glargine-yfgn  4 Units Subcutaneous QHS   linaclotide  145 mcg Oral QAC breakfast   pantoprazole  40 mg Oral Daily   predniSONE  20 mg Oral BID WC   senna  2 tablet Oral BID   vitamin B-12  1,000 mcg Oral Daily   Continuous Infusions:  anticoagulant sodium citrate       LOS: 9 days   Time spent: 80min  Amiliana Foutz C Felcia Huebert, DO [facility]  If 7PM-7AM, please contact night-coverage [email or web address]  04/26/2022, 7:09 AM

## 2022-04-27 ENCOUNTER — Inpatient Hospital Stay (HOSPITAL_COMMUNITY): Payer: BC Managed Care – PPO

## 2022-04-27 DIAGNOSIS — G9341 Metabolic encephalopathy: Secondary | ICD-10-CM | POA: Diagnosis not present

## 2022-04-27 LAB — HEPATIC FUNCTION PANEL
ALT: 40 U/L (ref 0–44)
AST: 54 U/L — ABNORMAL HIGH (ref 15–41)
Albumin: 1.6 g/dL — ABNORMAL LOW (ref 3.5–5.0)
Alkaline Phosphatase: 54 U/L (ref 38–126)
Bilirubin, Direct: 0.1 mg/dL (ref 0.0–0.2)
Indirect Bilirubin: 0.9 mg/dL (ref 0.3–0.9)
Total Bilirubin: 1 mg/dL (ref 0.3–1.2)
Total Protein: 4.1 g/dL — ABNORMAL LOW (ref 6.5–8.1)

## 2022-04-27 LAB — CBC
HCT: 18.7 % — ABNORMAL LOW (ref 36.0–46.0)
Hemoglobin: 5.8 g/dL — CL (ref 12.0–15.0)
MCH: 26.1 pg (ref 26.0–34.0)
MCHC: 31 g/dL (ref 30.0–36.0)
MCV: 84.2 fL (ref 80.0–100.0)
Platelets: 141 10*3/uL — ABNORMAL LOW (ref 150–400)
RBC: 2.22 MIL/uL — ABNORMAL LOW (ref 3.87–5.11)
RDW: 20.7 % — ABNORMAL HIGH (ref 11.5–15.5)
WBC: 14.8 10*3/uL — ABNORMAL HIGH (ref 4.0–10.5)
nRBC: 0.1 % (ref 0.0–0.2)

## 2022-04-27 LAB — RENAL FUNCTION PANEL
Albumin: 1.6 g/dL — ABNORMAL LOW (ref 3.5–5.0)
Anion gap: 8 (ref 5–15)
BUN: 34 mg/dL — ABNORMAL HIGH (ref 6–20)
CO2: 27 mmol/L (ref 22–32)
Calcium: 7 mg/dL — ABNORMAL LOW (ref 8.9–10.3)
Chloride: 100 mmol/L (ref 98–111)
Creatinine, Ser: 3.53 mg/dL — ABNORMAL HIGH (ref 0.44–1.00)
GFR, Estimated: 15 mL/min — ABNORMAL LOW (ref >60)
Glucose, Bld: 149 mg/dL — ABNORMAL HIGH (ref 70–99)
Phosphorus: 5.4 mg/dL — ABNORMAL HIGH (ref 2.5–4.6)
Potassium: 4 mmol/L (ref 3.5–5.1)
Sodium: 135 mmol/L (ref 135–145)

## 2022-04-27 LAB — GLUCOSE, CAPILLARY
Glucose-Capillary: 158 mg/dL — ABNORMAL HIGH (ref 70–99)
Glucose-Capillary: 157 mg/dL — ABNORMAL HIGH (ref 70–99)
Glucose-Capillary: 177 mg/dL — ABNORMAL HIGH (ref 70–99)
Glucose-Capillary: 192 mg/dL — ABNORMAL HIGH (ref 70–99)
Glucose-Capillary: 191 mg/dL — ABNORMAL HIGH (ref 70–99)

## 2022-04-27 LAB — HEMOGLOBIN AND HEMATOCRIT, BLOOD
HCT: 20.7 % — ABNORMAL LOW (ref 36.0–46.0)
HCT: 21.1 % — ABNORMAL LOW (ref 36.0–46.0)
Hemoglobin: 6.8 g/dL — CL (ref 12.0–15.0)
Hemoglobin: 7.2 g/dL — ABNORMAL LOW (ref 12.0–15.0)

## 2022-04-27 LAB — CK: Total CK: 1035 U/L — ABNORMAL HIGH (ref 38–234)

## 2022-04-27 LAB — PREPARE RBC (CROSSMATCH)

## 2022-04-27 LAB — CMV DNA, QUANTITATIVE, PCR
CMV DNA Quant: NEGATIVE IU/mL
Log10 CMV Qn DNA Pl: UNDETERMINED log10 IU/mL

## 2022-04-27 LAB — HEPARIN ANTI-XA: Heparin LMW: 1.07 IU/mL

## 2022-04-27 LAB — EPSTEIN BARR VRS(EBV DNA BY PCR): EBV DNA QN by PCR: NEGATIVE IU/mL

## 2022-04-27 MED ORDER — PREDNISONE 5 MG PO TABS
15.0000 mg | ORAL_TABLET | Freq: Every day | ORAL | Status: DC
  Administered 2022-04-27 – 2022-05-10 (×13): 15 mg via ORAL
  Filled 2022-04-27 (×12): qty 3

## 2022-04-27 MED ORDER — PREDNISONE 20 MG PO TABS
20.0000 mg | ORAL_TABLET | Freq: Every day | ORAL | Status: DC
  Administered 2022-04-28 – 2022-05-10 (×13): 20 mg via ORAL
  Filled 2022-04-27 (×13): qty 1

## 2022-04-27 MED ORDER — SODIUM CHLORIDE 0.9% IV SOLUTION: Freq: Once | INTRAVENOUS | Status: AC

## 2022-04-27 MED ORDER — MELATONIN 3 MG PO TABS
3.0000 mg | ORAL_TABLET | Freq: Once | ORAL | Status: AC | PRN
  Administered 2022-04-27: 3 mg via ORAL
  Filled 2022-04-27: qty 1

## 2022-04-27 MED ORDER — SODIUM CHLORIDE 0.9% IV SOLUTION: Freq: Once | INTRAVENOUS | Status: DC

## 2022-04-27 NOTE — Progress Notes (Signed)
Modified Barium Swallow Progress Note  Patient Details  Name: [name] MRN: [id number] Date of Birth: 1965-10-24  Today's Date: 04/27/2022  Modified Barium Swallow completed.  Full report located under Chart Review in the Imaging Section.  Brief recommendations include the following:  Clinical Impression  Patient presents with a mild oropharyngeal dysphagia as per this MBS. During oral phase, the only difficulty she had was mastication of regular solids and mildly delayed oral transit of bolus. She was not able to maintain bolus with 14mm barium tablet when attempted with thin liquids and then with puree solids and patient had to spit tablet out both times. No oral phase impairments observed with puree solids, thin liquids or nectar thick liquids. During pharyngeal phase, she exhibited swallow initiation to level of vallecular sinus with regular solids puree solids, nectar thick liquids and swallow initiation delay to level of pyriform sinus with thin liquids. She exhibited silent aspiration during the swallow (PAS 8) with thin liquids of trace amount. Aspiration did not occur with each swallow of thin liquids, however deep penetration did occur with majority of swallows of thin liquids. No significant pharyngeal residuals observed post swallows with any of the tested consistencies. Cervical esophageal phase appeared [facility].   Swallow Evaluation Recommendations       SLP Diet Recommendations: Dysphagia 3 (Mech soft) solids;Nectar thick liquid   Liquid Administration via: Straw;Cup   Medication Administration: Whole meds with puree   Supervision: Patient able to self feed;Intermittent supervision to cue for compensatory strategies   Compensations: Slow rate;Small sips/bites   Postural Changes: Seated upright at 90 degrees   Oral Care Recommendations: Oral care BID   Other Recommendations: Order thickener from pharmacy;Remove water pitcher;Clarify dietary restrictions;Prohibited food  (jello, ice cream, thin soups)   Angela Nevin, MA, CCC-SLP Speech Therapy

## 2022-04-27 NOTE — Progress Notes (Signed)
Overnight progress note  Patient had temporary HD catheter placed 7/3 and had issues with bleeding. This morning, her hemoglobin is down to 5.8, was 7.5 yesterday. Discussed with RN, no ongoing bleeding from HD cath tonight.   -Type and screen.  1 unit PRBCs ordered after obtaining consent from the patient.  Follow-up posttransfusion H&H, order additional blood transfusion if hemoglobin less than 7.

## 2022-04-27 NOTE — Progress Notes (Addendum)
Neurology Progress Note   S:// Patient is awake alert resting calmly in bed. Son is at bedside. RN at bedside. No new neurological events overnight. Her mentation is much improved to day compared to yesterday. She is able to state her full name, name of hospital, current year and city, age. Tracks. Can name objects. Speech is slightly delayed, however improved from yesterday.  Able to follow simple commands. Hgb low @ 5.8 will be receiving 1 Unit PRBC  O:// Current vital signs: BP 128/80   Pulse 94   Temp 98.3 F (36.8 C) (Oral)   Resp 17   Ht 5\' 5"  (1.651 m)   Wt 105.2 kg   SpO2 100%   BMI 38.59 kg/m  Vital signs in last 24 hours: Temp:  [98.2 F (36.8 C)-99 F (37.2 C)] 98.3 F (36.8 C) (07/06 0900) Pulse Rate:  [0-101] 94 (07/06 0621) Resp:  [16-23] 17 (07/06 0621) BP: (111-135)/(80-94) 128/80 (07/06 0621) SpO2:  [0 %-100 %] 100 % (07/06 0621) Weight:  [105.2 kg] 105.2 kg (07/05 1914)  GENERAL: awake and alert laying in bed in NAD HEENT: - Normocephalic and atraumatic, dry mm LUNGS - bilateral with  wheezes CV - S1S2 RRR, no m/r/g, equal pulses bilaterally. ABDOMEN - Soft, nontender, nondistended with normoactive BS Ext: warm, well perfused, intact peripheral pulses, no edema. Multiple bruises. Missing all 10 toes on feet  NEURO:  Mental Status: still slightly confused, however mentation is much better today. AA&Ox2. She is able to state her full name, name of hospital, current year and city. Unable to stat correct age, States incorrect age "56" Tracks. Can name objects. Speech is slightly delayed, however improved from yesterday.  Able to follow simple commands Cranial Nerves: PERRL 3 mm/brisk. EOMI, visual fields full to threat, no facial asymmetry, facial sensation intact, hearing intact, tongue/uvula/soft palate midline, normal sternocleidomastoid and trapezius muscle strength. No evidence of tongue atrophy or fibrillations Motor: 5/5 in bilateral uppers, lowers  4/5 Tone: is normal and bulk is normal Sensation- appears to be intact  Coordination: unable to assess  Gait- deferred    Medications  Current Facility-Administered Medications:    0.9 %  sodium chloride infusion (Manually program via Guardrails IV Fluids), , Intravenous, Once, 57, MD   acetaminophen (TYLENOL) tablet 650 mg, 650 mg, Oral, Q6H PRN, Tat, David, MD, 650 mg at 04/26/22 2130   albuterol (PROVENTIL) (2.5 MG/3ML) 0.083% nebulizer solution 2.5 mg, 2.5 mg, Nebulization, BID PRN, 2131, MD   alteplase (CATHFLO ACTIVASE) injection 2 mg, 2 mg, Intracatheter, Once PRN, Azucena Fallen, MD   anticoagulant sodium citrate solution 5 mL, 5 mL, Dialysis, PRN, Zetta Bills, MD   Chlorhexidine Gluconate Cloth 2 % PADS 6 each, 6 each, Topical, Daily, Tat, David, MD, 6 each at 04/26/22 0948   DULoxetine (CYMBALTA) DR capsule 30 mg, 30 mg, Oral, Daily, 06/27/22, MD, 30 mg at 04/27/22 0930   enoxaparin (LOVENOX) injection 110 mg, 110 mg, Subcutaneous, Q24H, 06/28/22, MD, 110 mg at 04/26/22 2130   folic acid (FOLVITE) tablet 2 mg, 2 mg, Oral, Daily, Tat, David, MD, 2 mg at 04/27/22 0930   guaiFENesin-dextromethorphan (ROBITUSSIN DM) 100-10 MG/5ML syrup 10 mL, 10 mL, Oral, Q12H, Tat, 06/28/22, MD, 10 mL at 04/27/22 0928   heparin injection 1,000 Units, 1,000 Units, Intracatheter, PRN, 06/28/22, MD, 1,000 Units at 04/26/22 1919   insulin aspart (novoLOG) injection 0-9 Units, 0-9 Units, Subcutaneous, TID WC, 06/27/22, MD, 2 Units at  04/27/22 0627   insulin glargine-yfgn (SEMGLEE) injection 4 Units, 4 Units, Subcutaneous, QHS, Tat, Onalee Hua, MD, 4 Units at 04/26/22 2129   lidocaine (LIDODERM) 5 % 1 patch, 1 patch, Transdermal, Daily PRN, Tat, David, MD, 1 patch at 04/17/22 2123   linaclotide (LINZESS) capsule 145 mcg, 145 mcg, Oral, QAC breakfast, Tat, Onalee Hua, MD, 145 mcg at 04/26/22 0944   lip balm (CARMEX) ointment 1 Application, 1 Application, Topical,  PRN, Azucena Fallen, MD   loperamide HCl (IMODIUM) 1 MG/7.5ML suspension 2 mg, 2 mg, Oral, PRN, Azucena Fallen, MD, 2 mg at 04/26/22 1147   methocarbamol (ROBAXIN) tablet 500 mg, 500 mg, Oral, Q8H PRN, Tat, Onalee Hua, MD, 500 mg at 04/26/22 2136   ondansetron (ZOFRAN) tablet 4 mg, 4 mg, Oral, Q6H PRN **OR** ondansetron (ZOFRAN) injection 4 mg, 4 mg, Intravenous, Q6H PRN, Tat, David, MD   pantoprazole (PROTONIX) EC tablet 40 mg, 40 mg, Oral, Daily, Tat, David, MD, 40 mg at 04/27/22 0272   predniSONE (DELTASONE) tablet 20 mg, 20 mg, Oral, BID WC, Tat, Onalee Hua, MD, 20 mg at 04/27/22 0800   senna (SENOKOT) tablet 17.2 mg, 2 tablet, Oral, BID, Tat, David, MD, 17.2 mg at 04/27/22 5366   vitamin B-12 (CYANOCOBALAMIN) tablet 1,000 mcg, 1,000 mcg, Oral, Daily, Tat, David, MD, 1,000 mcg at 04/27/22 0930  LABS:  Na 126->134->135 BUN 83->62->34 Cr 7.59->7.0->3.53 Phos 9.3->6.5 Ca 6.5->7.0->7.0 CK 4061->3558->1035 WBC 10.9->14.5->14.8 HGB 8.1->8.3->5.8 Plt 271->254->141 AST 122->95->54 ALT 120->77->40 ANCA, ANA, anti-dsDNA, cryos, c3, c4, and spep/sflc, hepatitis, HIV, CMV, EBV- pending Ammonia 14 on 6/29 TSH 1.657 Vit B12 909    Imaging I have reviewed images in epic and the results pertinent to this consultation are:  CT-head 6/26: No acute abnormality. 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 Degenerative disc and facet disease changes of the cervical spine. No acute cervical spine abnormalities.   MRI examination of the brain 7/2: 1. No acute intracranial abnormality. 2. 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 3. Moderate diffuse white matter disease likely reflects the sequela of chronic microvascular ischemia. 4. Left mastoid effusion. No obstructing nasopharyngeal lesion is present.   rEEG 6/29: This study is suggestive of moderate diffuse encephalopathy, nonspecific to etiology. No  seizures or epileptiform discharges were seen throughout the recording. Patient was noted to have brief intermittent whole body twitching as well as asynchronous and non- rhythmic twitching of arms and legs without concomitant EEG change. These episodes were NOT epileptic.    Assessment:  56 y.o. female with a past medical history of  Wegener's angiitis, chronic opioid dependency, hyperlipidemia, hypertension, impaired glucose tolerance, adrenal insufficiency on chronic prednisone usage, rheumatoid arthritis and multiple admissions secondary to sepsis in the setting of pneumonia and UTI.   Impression: - Acute encephalopathy/hepatic encephalopathy/uremic encephalopathy is most likely multifactorial due to metabolic derangements, AKI, COVID 19 infection, sepsis/infection, Elevated liver enzymes  Recommendations: Continue to correct metabolic derangements per primary team  - Continue to treat infections - Limit sedating medications - Delirium precautions - AMS has improved considerably and believe we can hold off on LP for now since clinically she is improving. Can Consider LP to r/o encephalitis/meningitis if change in AMS after correction of metabolic derangements.  She has an intrathecal pump. If LP is determined to be necessary, then it will need to be obtained under fluoro - Neurology will sign off. Please call with questions or concerns.   Gevena Mart  DNP, ACNPC-AG  NEUROHOSPITALIST ADDENDUM Performed a face to face diagnostic evaluation.   I have reviewed the contents of history and physical exam as documented by PA/ARNP/Resident and agree with above documentation.  I have discussed and formulated the above plan as documented. Edits to the note have been made as needed.  Erick Blinks, MD [facility] [phone]   If 7pm to 7am, please call on call as listed on AMION.

## 2022-04-27 NOTE — Progress Notes (Signed)
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   Patient presents with worsening intermittent altered mental status and increased weakness.  She has been declining since approximately January, 6 months ago per husband.  She was recently admitted to our facility and treated for pneumonia with moderate improvement.  She was to continue on vancomycin through 04/08/2022 followed by infectious disease.  Bronchoscopy during that admission on 03/30/2022 shows Candida in the setting of ongoing antibiotics and urine culture ultimately grew yeast - deemed to be colonized and not active infection given fungitel was reassuringly low per ID discussion. Of note she is also had a recent diagnosis of pulmonary embolism on apixaban.   Patient admitted this hospitalization (on 04/17/22) with elevated white count, lactic acid, and acute renal failure in the setting of poor p.o. intake altered mental status tachycardia and tachypnea concerning for sepsis. Patient's infectious source was presumed to be pneumonia versus UTI - placed initially on broad-spectrum antibiotics of vancomycin and cefepime. Her sepsis criteria did not improve and her renal function continued to worsen in the setting of presumed sepsis and rhabdomyolysis (ATN). Nephrology following with worsening mental status, tremors/muscle spasms with metabolic derangements. Patient transferred to main campus for neurology evaluation, EEG ultimately unremarkable for seizures-her movement was considered to be musculoskeletal, somewhat choreiform in nature, holding off on LP per neuro given improvements with HD and intrathecal pump turned down (unable to be turned  off but can be turned to [facility]).  New events last 24 hours / Subjective: Had some episode of bleeding around the dialysis catheter.  Hemoglobin was found to be 5.8 and blood transfusion was ordered.  On exam, appears her mentation has improved since admission.  Assessment & Plan:   Principal Problem:   Acute metabolic encephalopathy Active Problems:   Acute cystitis without hematuria   Current chronic use of systemic steroids   Chronic pain   Secondary adrenal insufficiency (HCC)   AKI (acute kidney injury) ([place])   Granulomatosis with polyangiitis (HCC)   GERD (gastroesophageal reflux disease)   Essential hypertension   Obesity (BMI 30-39.9)   COVID-19 virus infection   Severe sepsis ([place])   Pulmonary embolism (HCC)   Transaminitis   Rhabdomyolysis    Acute metabolic encephalopathy, POA, multifactorial - Initially presumed to be secondary to sepsis/renal failure/hyponatremia  - Potential covid encephalitis, however spastic choreiform movements are not consistent with COVID encephalitis - MRI unremarkable for acute process  - EEG unremarkable for seizure - Discontinued all home CNS depressant medications - continued Cymbalta only - Mental status improving after initiating dialysis and discontinuation of intrathecal pain pump - Discussed with neurology.  Holding off on LP and CTA head at this time with improvement  Acute blood loss anemia on anemia of chronic kidney disease - Noted some bleeding from her dialysis catheter - Hemoglobin 5.8.  Transfused 2 units packed red blood cell today.   Polypharmacy risk - Home medications include bupivacaine/zinconotide intrathecal pump, duloxetine, gabapentin, methocarbamol, nucynta, oxycodone, Topamax   Chronic pain, neuropathy, and chronic opioid dependence  - Patient has intrathecal pump with bupivacaine and ziconotide - turned down to lowest setting 7/3 (cannot stop completely per tech) - Consider restarting low dose PO oxy vs  nucynta   AKI/ATN currently  requiring HD - Complicated and multifactorial etiology, possibly ATN per discussion with nephrology - Temp HD catheter initially placed 04/24/2022 - issues with bleeding ongoing - Nephro/IR to evaluate for dressing/suture replacement   Granulomatosis with polyangiitis  - Patient receiving rituximab and methotrexate as an outpatient (most recently at [facility] in May 2023) - confirmed with rheumatology at [facility] (Dr. Benjamine Mola) - Nephrology sent off multiple biomarkers: *Of note C4, and Kappa light chains* were elevated minimally but otherwise ANCA, ANA, RF, anti-dsDNA, c3, spep/sflc without abnormalities, hepatitis panel, HIV, EBV, CMV were unremarkable - Cryos still pending  - Continue prednisone 52m qAM and 160mqPM - wean to baseline level   Secondary adrenal insufficiency  -Continue steroids    Current chronic use of systemic steroids -Currently remains on prednisone 20 mg twice daily, on 5 mg daily at baseline.  Continue to wean   Acute hypoxic respiratory failure, POA -Multifactorial in the setting of metabolic encephalopathy, presumed pneumonia, granulomatosis/polyangiitis, concurrent COVID-19 infection -Potentially exacerbated by hypervolemia surrounding renal failure -Resolved and remains on room air   Presumed severe sepsis secondary to pneumonia versus UTI -Completed 5 days of antibiotic therapy  C. difficile colonization -C. difficile toxin is negative.  No indication of treatment at this time   GERD  -PPI    Rhabdomyolysis -Secondary to prolonged downtime -Improved   Elevated transaminitis -Most likely in the setting of sepsis/shock liver and rhabdomyolysis. -Improved   History of pulmonary embolism  -Lovenox   COVID-19 virus infection, questionably incidental - COVID PCR incidentally positive at time of admission, without respiratory symptoms - Remdesivir withheld due to elevated liver enzymes - Completed 10-day  quarantine  Diabetes mellitus -A1c 8.6 -Semglee, sliding scale insulin   Obesity  Estimated body mass index is 38.37 kg/m as calculated from the following:   Height as of this encounter: 5' 5" (1.651 m).   Weight as of this encounter: 104.6 kg.     DVT prophylaxis: Lovenox   Code Status: Full code Family Communication: Son at bedside Disposition Plan:  Status is: Inpatient Remains inpatient appropriate because: She will need PT OT.  Transfusing blood today.  Dialysis per nephrology   Antimicrobials:  Anti-infectives (From admission, onward)    Start     Dose/Rate Route Frequency Ordered Stop   04/21/22 2200  meropenem (MERREM) 500 mg in sodium chloride 0.9 % 100 mL IVPB  Status:  Discontinued        500 mg 200 mL/hr over 30 Minutes Intravenous Every 12 hours 04/21/22 1507 04/22/22 1945   04/21/22 1000  fluconazole (DIFLUCAN) tablet 100 mg  Status:  Discontinued        100 mg Oral Daily 04/20/22 0952 04/22/22 1945   04/20/22 1500  meropenem (MERREM) 1 g in sodium chloride 0.9 % 100 mL IVPB  Status:  Discontinued        1 g 200 mL/hr over 30 Minutes Intravenous Every 12 hours 04/20/22 1408 04/21/22 1507   04/20/22 1045  fluconazole (DIFLUCAN) tablet 200 mg        200 mg Oral  Once 04/20/22 0952 04/20/22 1207   04/20/22 1021  ceFEPIme (MAXIPIME) 2 g in sodium chloride 0.9 % 100 mL IVPB  Status:  Discontinued        2 g 200 mL/hr over 30 Minutes Intravenous Every 24 hours 04/19/22 1355 04/20/22 1334   04/19/22 1100  linezolid (ZYVOX) IVPB 600 mg  Status:  Discontinued        600 mg 300 mL/hr  over 60 Minutes Intravenous Every 12 hours 04/19/22 1014 04/22/22 1945   04/18/22 1200  vancomycin (VANCOREADY) IVPB 750 mg/150 mL  Status:  Discontinued        750 mg 150 mL/hr over 60 Minutes Intravenous Every 24 hours 04/17/22 1306 04/19/22 1014   04/17/22 2200  ceFEPIme (MAXIPIME) 2 g in sodium chloride 0.9 % 100 mL IVPB  Status:  Discontinued        2 g 200 mL/hr over 30 Minutes  Intravenous Every 12 hours 04/17/22 1306 04/19/22 1355   04/17/22 1300  vancomycin (VANCOREADY) IVPB 2000 mg/400 mL        2,000 mg 200 mL/hr over 120 Minutes Intravenous  Once 04/17/22 1208 04/17/22 1729   04/17/22 1200  ceFEPIme (MAXIPIME) 2 g in sodium chloride 0.9 % 100 mL IVPB        2 g 200 mL/hr over 30 Minutes Intravenous  Once 04/17/22 1153 04/17/22 1330   04/17/22 1200  metroNIDAZOLE (FLAGYL) IVPB 500 mg        500 mg 100 mL/hr over 60 Minutes Intravenous  Once 04/17/22 1153 04/17/22 1435   04/17/22 1200  vancomycin (VANCOCIN) IVPB 1000 mg/200 mL premix  Status:  Discontinued        1,000 mg 200 mL/hr over 60 Minutes Intravenous  Once 04/17/22 1153 04/17/22 1208        Objective: Vitals:   04/27/22 1300 04/27/22 1400 04/27/22 1445 04/27/22 1500  BP:   117/88 (!) 117/91  Pulse: 93 (!) 103 (!) 105 (!) 106  Resp: 16 (!) _0 Temp:   98.2 F (36.8 C) 98.7 F (37.1 C)  TempSrc:   Oral Oral  SpO2: 100% 100% 94% 94%  Weight:      Height:        Intake/Output Summary (Last 24 hours) at 04/27/2022 1516 Last data filed at 04/27/2022 1500 Gross per 24 hour  Intake 1293.25 ml  Output 2200 ml  Net -906.75 ml   Filed Weights   04/25/22 1509 04/26/22 1402 04/26/22 1914  Weight: 104.6 kg 105.2 kg 105.2 kg    Examination:  General exam: Appears calm and comfortable  Respiratory system: Clear to auscultation. Respiratory effort normal. No respiratory distress. No conversational dyspnea.  Cardiovascular system: S1 & S2 heard, RRR. No murmurs. No pedal edema. Gastrointestinal system: Abdomen is nondistended, soft and nontender. Normal bowel sounds heard. Central nervous system: Alert and oriented to self, place but not to year Extremities: Symmetric in appearance  Skin: No rashes, lesions or ulcers on exposed skin  Psychiatry:  Mood & affect appropriate.   Data Reviewed: I have personally reviewed following labs and imaging studies  CBC: Recent Labs  Lab  04/22/22 0336 04/24/22 0816 04/25/22 0022 04/26/22 0350 04/27/22 0215 04/27/22 1130  WBC 10.9* 13.5* 14.5* 12.5* 14.8*  --   HGB 8.1* 9.0* 8.3* 7.5* 5.8* 6.8*  HCT 25.3* 27.9* 25.2* 23.6* 18.7* 20.7*  MCV 80.3 81.3 80.3 81.9 84.2  --   PLT 271 271 254 203 141*  --    Basic Metabolic Panel: Recent Labs  Lab 04/23/22 1015 04/24/22 0816 04/25/22 0022 04/26/22 0350 04/27/22 0215  NA 126* 127* 134* 136 135  K 4.6 5.1 4.0 4.0 4.0  CL 89* 90* 94* 94* 100  CO2 18* 17* _1 GLUCOSE 131* 81 100* 84 149*  BUN 83* 101* 62* 64* 34*  CREATININE 7.59* 8.35* 5.20* 5.45* 3.53*  CALCIUM 6.5* 6.3* 7.0* 6.9* 7.0*  PHOS 9.3* 11.2* 6.5* 7.4* 5.4*   GFR: Estimated Creatinine Clearance: 21.4 mL/min (A) (by C-G formula based on SCr of 3.53 mg/dL (H)). Liver Function Tests: Recent Labs  Lab 04/22/22 0336 04/22/22 2031 04/23/22 1015 04/24/22 0816 04/25/22 0022 04/26/22 0350 04/27/22 0215  AST 162* 122*  --   --  95* 72* 54*  ALT 140* 120*  --   --  77* 54* 40  ALKPHOS 69 73  --   --  67 56 54  BILITOT 0.9 1.0  --   --  0.7 1.1 1.0  PROT 4.6* 4.6*  --   --  5.0* 4.4* 4.1*  ALBUMIN 1.8*  1.8* 1.8* 2.0* 1.9* 1.9*  1.9* 1.8*  1.8* 1.6*  1.6*   No results for input(s): "LIPASE", "AMYLASE" in the last 168 hours. No results for input(s): "AMMONIA" in the last 168 hours. Coagulation Profile: No results for input(s): "INR", "PROTIME" in the last 168 hours. Cardiac Enzymes: Recent Labs  Lab 04/23/22 1015 04/24/22 0816 04/25/22 0022 04/26/22 0350 04/27/22 0215  CKTOTAL 4,061* 4,049* 3,558* 2,171* 1,035*   BNP (last 3 results) No results for input(s): "PROBNP" in the last 8760 hours. HbA1C: No results for input(s): "HGBA1C" in the last 72 hours. CBG: Recent Labs  Lab 04/26/22 1753 04/26/22 2012 04/27/22 0615 04/27/22 0650 04/27/22 1143  GLUCAP 135* 114* 158* 157* 177*   Lipid Profile: No results for input(s): "CHOL", "HDL", "LDLCALC", "TRIG", "CHOLHDL", "LDLDIRECT" in  the last 72 hours. Thyroid Function Tests: No results for input(s): "TSH", "T4TOTAL", "FREET4", "T3FREE", "THYROIDAB" in the last 72 hours. Anemia Panel: No results for input(s): "VITAMINB12", "FOLATE", "FERRITIN", "TIBC", "IRON", "RETICCTPCT" in the last 72 hours. Sepsis Labs: No results for input(s): "PROCALCITON", "LATICACIDVEN" in the last 168 hours.  Recent Results (from the past 240 hour(s))  Gastrointestinal Panel by PCR , Stool     Status: None   Collection Time: 04/23/22  3:55 PM   Specimen: Stool  Result Value Ref Range Status   Campylobacter species NOT DETECTED NOT DETECTED Final   Plesimonas shigelloides NOT DETECTED NOT DETECTED Final   Salmonella species NOT DETECTED NOT DETECTED Final   Yersinia enterocolitica NOT DETECTED NOT DETECTED Final   Vibrio species NOT DETECTED NOT DETECTED Final   Vibrio cholerae NOT DETECTED NOT DETECTED Final   Enteroaggregative E coli (EAEC) NOT DETECTED NOT DETECTED Final   Enteropathogenic E coli (EPEC) NOT DETECTED NOT DETECTED Final   Enterotoxigenic E coli (ETEC) NOT DETECTED NOT DETECTED Final   Shiga like toxin producing E coli (STEC) NOT DETECTED NOT DETECTED Final   Shigella/Enteroinvasive E coli (EIEC) NOT DETECTED NOT DETECTED Final   Cryptosporidium NOT DETECTED NOT DETECTED Final   Cyclospora cayetanensis NOT DETECTED NOT DETECTED Final   Entamoeba histolytica NOT DETECTED NOT DETECTED Final   Giardia lamblia NOT DETECTED NOT DETECTED Final   Adenovirus F40/41 NOT DETECTED NOT DETECTED Final   Astrovirus NOT DETECTED NOT DETECTED Final   Norovirus GI/GII NOT DETECTED NOT DETECTED Final   Rotavirus A NOT DETECTED NOT DETECTED Final   Sapovirus (I, II, IV, and V) NOT DETECTED NOT DETECTED Final    Comment: Performed at [facility], [address] [place], [place] [address]  C Difficile Quick Screen (NO PCR Reflex)     Status: Abnormal   Collection Time: 04/23/22  3:57 PM   Specimen: Stool  Result Value Ref  Range Status   C Diff antigen POSITIVE (A) NEGATIVE Final   C Diff toxin  NEGATIVE NEGATIVE Final   C Diff interpretation   Final    Results are indeterminate. Please contact the provider listed for your campus for C diff questions in [place].    Comment: Performed at [facility], [place] [address], [place], [place] [address]      Radiology Studies: DG Swallowing Func-Speech Pathology  Result Date: 04/27/2022 Table formatting from the original result was not included. Objective Swallowing Evaluation: Type of Study: MBS-Modified Barium Swallow Study  Patient Details Name: Keyonni Percival MRN: [id number] Date of Birth: Jan 26, 1966 Today's Date: 04/27/2022 Time: SLP Start Time (ACUTE ONLY): 60 -SLP Stop Time (ACUTE ONLY): 1250 SLP Time Calculation (min) (ACUTE ONLY): 20 min Past Medical History: Past Medical History: Diagnosis Date  Chronic cough   Chronic pain   Diabetes mellitus without complication (HCC)   Dyspnea   GERD (gastroesophageal reflux disease)   Hypokalemia   Hypothyroidism   Left wrist pain 06/01/2021  Myonecrosis ([place]) 07/01/2021  Neurocardiogenic syncope   OSA (obstructive sleep apnea)   does not use CPAP  Osteomyelitis (HCC)   bilateral feet  Peripheral neuropathy   Presence of intrathecal pump   recieves Prialt/bupivicaine  Tear of gluteus medius tendon 07/01/2021  Wegener's granulomatosis 2009  Wegner's disease (congenital syphilitic osteochondritis)  Past Surgical History: Past Surgical History: Procedure Laterality Date  AMPUTATION Right 10/21/2020  Procedure: Right hallux amputation;  Surgeon: Wylene Simmer, MD;  Location: [place];  Service: Orthopedics;  Laterality: Right;  AMPUTATION TOE Bilateral 08/12/2020  Procedure: Right 3rd toe amputation; left hallux and 3rd toe amputation;  Surgeon: Wylene Simmer, MD;  Location: [place];  Service: Orthopedics;  Laterality: Bilateral;  53mn  AMPUTATION TOE Right 03/24/2021  Procedure: AMPUTATION TOE;  Surgeon: HWylene Simmer MD;  [place] [facility]  Service: Orthopedics;  Laterality: Right;  APPENDECTOMY    BACK SURGERY    BIOPSY  03/30/2022  Procedure: BIOPSY;  Surgeon: OLaurin Coder MD;  [facility]  Service: Pulmonary;;  BRONCHIAL WASHINGS  03/30/2022  Procedure: BRONCHIAL WASHINGS;  Surgeon: OLaurin Coder MD;  [place] [facility]  Service: Pulmonary;;  CHOLECYSTECTOMY    COLONOSCOPY N/A 06/02/2013  Procedure: COLONOSCOPY;  Surgeon: SWonda Horner MD;  [facility];  Service: Endoscopy;  Laterality: N/A;  HERNIA REPAIR  2000  INCISION AND DRAINAGE ABSCESS Left 07/07/2016  Procedure: DEBRIDMENT LEFT THIGH ABSCESS, EXISION ACUTE SKIN RASH LEFT THIGH(1CM LESION);  Surgeon: HFanny Skates MD;  [facility];  Service: General;  Laterality: Left;  IR FLUORO GUIDE CV LINE RIGHT  04/24/2022  IR [place] VASC ACCESS RIGHT  04/24/2022  SEPTOPLASTY  02/2013  spleen anuyism    splenic aneurysm    TRANSMETATARSAL AMPUTATION Bilateral 10/21/2020  Procedure: Left transmetatarsal amputation;  Surgeon: HWylene Simmer MD;  [place] [facility]  Service: Orthopedics;  Laterality: Bilateral;  TRANSMETATARSAL AMPUTATION Left 03/24/2021  Procedure: TRANSMETATARSAL AMPUTATION;  Surgeon: HWylene Simmer MD;  [place] [facility]  Service: Orthopedics;  Laterality: Left;  VAGINAL HYSTERECTOMY    VIDEO BRONCHOSCOPY Right 03/30/2022  Procedure: VIDEO BRONCHOSCOPY WITH FLUORO;  Surgeon: OLaurin Coder MD;  [facility];  Service: Pulmonary;  Laterality: Right;  atypical pneumonia HPI: 56y.o. female with medical history significant of Wegener's angiitis, chronic opioid dependency, hyperlipidemia, hypertension, impaired glucose tolerance, adrenal insufficiency on chronic prednisone usage, rheumatoid arthritis and multiple admissions secondary to sepsis in the setting of pneumonia and UTI.  Presented to the hospital ([facility] on 04/17/22 with  complaints of intermittent altered mental status changes at  home and increased weakness.  Symptoms have been present for the last 2-3 days and worsening according to patient's husband.  Patient has just completed her antibiotic therapy from most recent admission about 1 week prior to presentation.  In the ED patient met criteria for severe sepsis with elevated WBCs, elevated lactic acid in the 4.7 range, acute renal failure as organ dysfunction and was also tachycardic and tachypneic;Subsequently found to be Covid+; transferred to [facility] on 04/22[facility]22; ST evaluation on 04/21/22 indicating need for Dysphagia 2/honey-thickened liquids: CXR on 04/17/22 indicated Progressive interstitial pattern left lung and right upper lobe.  This may represent edema or infection; ST f/u for potential objective assessment/diet tolerance.  Subjective: pleasant, cooperative  Recommendations for follow up therapy are one component of a multi-disciplinary discharge planning process, led by the attending physician.  Recommendations may be updated based on patient status, additional functional criteria and insurance authorization. Assessment / Plan / Recommendation   04/27/2022   1:24 PM Clinical Impressions Clinical Impression Patient presents with a mild oropharyngeal dysphagia as per this MBS. During oral phase, the only difficulty she had was mastication of regular solids and mildly delayed oral transit of bolus. She was not able to maintain bolus with 32m barium tablet when attempted with thin liquids and then with puree solids and patient had to spit tablet out both times. No oral phase impairments observed with puree solids, thin liquids or nectar thick liquids. During pharyngeal phase, she exhibited swallow initiation to level of vallecular sinus with regular solids puree solids, nectar thick liquids and swallow initiation delay to level of pyriform sinus with thin liquids. She exhibited silent aspiration during the swallow (PAS 8) with thin liquids of trace amount. Aspiration did not occur  with each swallow of thin liquids, however deep penetration did occur with majority of swallows of thin liquids. No significant pharyngeal residuals observed post swallows with any of the tested consistencies. Cervical esophageal phase appeared [facility] S[facility] Visit Diagnosis Dysphagia, oropharyngeal phase (R13.12) Impact on safety and function Mild aspiration risk;Moderate aspiration risk     04/27/2022   1:24 PM Treatment Recommendations Treatment Recommendations Therapy as outlined in treatment plan below     04/27/2022   1:30 PM Prognosis Prognosis for Safe Diet Advancement Good Barriers to Reach Goals Severity of deficits;Time post onset   04/27/2022   1:24 PM Diet Recommendations SLP Diet Recommendations Dysphagia 3 (Mech soft) solids;nectar thick liquid Liquid Administration via Straw;Cup Medication Administration Whole meds with puree Compensations Slow rate;Small sips/bites Postural Changes Seated upright at 90 degrees     04/27/2022   1:24 PM Other Recommendations Oral Care Recommendations Oral care BID Other Recommendations Order thickener from pharmacy;Remove water pitcher;Clarify dietary restrictions;Prohibited food (jello, ice cream, thin soups) Follow Up Recommendations Follow physician's recommendations for discharge plan and follow up therapies Assistance recommended at discharge Frequent or constant Supervision/Assistance   04/27/2022   1:24 PM Frequency and Duration  Speech Therapy Frequency (ACUTE ONLY) min 2x/week     04/27/2022   1:23 PM Oral Phase Oral Phase Impaired Oral - Regular Impaired mastication;Reduced posterior propulsion Oral - Pill Decreased bolus cohesion    04/27/2022   1:23 PM Pharyngeal Phase Pharyngeal Phase Impaired Pharyngeal- Nectar Cup Delayed swallow initiation-vallecula Pharyngeal- Thin Cup Delayed swallow initiation-pyriform sinuses;Reduced airway/laryngeal closure;Penetration/Aspiration during swallow;Trace aspiration Pharyngeal Material enters airway, passes BELOW cords without attempt by  patient to eject out (silent aspiration) Pharyngeal- Puree Delayed swallow initiation-vallecula Pharyngeal- Regular  Delayed swallow initiation-vallecula Pharyngeal- Pill NT    04/27/2022   1:24 PM Cervical Esophageal Phase  Cervical Esophageal Phase [facility] Sonia Baller, MA, CCC-SLP Speech Therapy                        Scheduled Meds:  sodium chloride   Intravenous Once   sodium chloride   Intravenous Once   Chlorhexidine Gluconate Cloth  6 each Topical Daily   DULoxetine  30 mg Oral Daily   enoxaparin (LOVENOX) injection  110 mg Subcutaneous I50Y   folic acid  2 mg Oral Daily   guaiFENesin-dextromethorphan  10 mL Oral Q12H   insulin aspart  0-9 Units Subcutaneous TID WC   insulin glargine-yfgn  4 Units Subcutaneous QHS   linaclotide  145 mcg Oral QAC breakfast   pantoprazole  40 mg Oral Daily   predniSONE  15 mg Oral Q supper   [START ON 04/28/2022] predniSONE  20 mg Oral Q breakfast   senna  2 tablet Oral BID   vitamin B-12  1,000 mcg Oral Daily   Continuous Infusions:  anticoagulant sodium citrate       LOS: 10 days     Dessa Phi, DO [facility] 04/27/2022, 3:16 PM   Available via Epic secure chat 7am-7pm After these hours, please refer to coverage provider listed on [email or web address]

## 2022-04-28 DIAGNOSIS — G9341 Metabolic encephalopathy: Secondary | ICD-10-CM | POA: Diagnosis not present

## 2022-04-28 LAB — CBC
HCT: 16.5 % — ABNORMAL LOW (ref 36.0–46.0)
HCT: 18.6 % — ABNORMAL LOW (ref 36.0–46.0)
Hemoglobin: 5.6 g/dL — CL (ref 12.0–15.0)
Hemoglobin: 6.3 g/dL — CL (ref 12.0–15.0)
MCH: 28.6 pg (ref 26.0–34.0)
MCH: 28.3 pg (ref 26.0–34.0)
MCHC: 33.9 g/dL (ref 30.0–36.0)
MCHC: 33.9 g/dL (ref 30.0–36.0)
MCV: 84.2 fL (ref 80.0–100.0)
MCV: 83.4 fL (ref 80.0–100.0)
Platelets: 114 10*3/uL — ABNORMAL LOW (ref 150–400)
Platelets: 106 10*3/uL — ABNORMAL LOW (ref 150–400)
RBC: 1.96 MIL/uL — ABNORMAL LOW (ref 3.87–5.11)
RBC: 2.23 MIL/uL — ABNORMAL LOW (ref 3.87–5.11)
RDW: 17.4 % — ABNORMAL HIGH (ref 11.5–15.5)
RDW: 17.5 % — ABNORMAL HIGH (ref 11.5–15.5)
WBC: 22.9 10*3/uL — ABNORMAL HIGH (ref 4.0–10.5)
WBC: 19.5 10*3/uL — ABNORMAL HIGH (ref 4.0–10.5)
nRBC: 0.2 % (ref 0.0–0.2)
nRBC: 0.3 % — ABNORMAL HIGH (ref 0.0–0.2)

## 2022-04-28 LAB — RENAL FUNCTION PANEL
Albumin: 1.7 g/dL — ABNORMAL LOW (ref 3.5–5.0)
Anion gap: 16 — ABNORMAL HIGH (ref 5–15)
BUN: 49 mg/dL — ABNORMAL HIGH (ref 6–20)
CO2: 25 mmol/L (ref 22–32)
Calcium: 7.4 mg/dL — ABNORMAL LOW (ref 8.9–10.3)
Chloride: 97 mmol/L — ABNORMAL LOW (ref 98–111)
Creatinine, Ser: 4.3 mg/dL — ABNORMAL HIGH (ref 0.44–1.00)
GFR, Estimated: 11 mL/min — ABNORMAL LOW (ref >60)
Glucose, Bld: 160 mg/dL — ABNORMAL HIGH (ref 70–99)
Phosphorus: 6.2 mg/dL — ABNORMAL HIGH (ref 2.5–4.6)
Potassium: 3.8 mmol/L (ref 3.5–5.1)
Sodium: 138 mmol/L (ref 135–145)

## 2022-04-28 LAB — LACTATE DEHYDROGENASE: LDH: 624 U/L — ABNORMAL HIGH (ref 98–192)

## 2022-04-28 LAB — GLUCOSE, CAPILLARY
Glucose-Capillary: 139 mg/dL — ABNORMAL HIGH (ref 70–99)
Glucose-Capillary: 155 mg/dL — ABNORMAL HIGH (ref 70–99)
Glucose-Capillary: 134 mg/dL — ABNORMAL HIGH (ref 70–99)

## 2022-04-28 LAB — HEMOGLOBIN AND HEMATOCRIT, BLOOD
HCT: 21.5 % — ABNORMAL LOW (ref 36.0–46.0)
Hemoglobin: 7.5 g/dL — ABNORMAL LOW (ref 12.0–15.0)

## 2022-04-28 LAB — HEPATIC FUNCTION PANEL
ALT: 34 U/L (ref 0–44)
AST: 52 U/L — ABNORMAL HIGH (ref 15–41)
Albumin: 1.7 g/dL — ABNORMAL LOW (ref 3.5–5.0)
Alkaline Phosphatase: 44 U/L (ref 38–126)
Bilirubin, Direct: 0.1 mg/dL (ref 0.0–0.2)
Indirect Bilirubin: 1 mg/dL — ABNORMAL HIGH (ref 0.3–0.9)
Total Bilirubin: 1.1 mg/dL (ref 0.3–1.2)
Total Protein: 4.1 g/dL — ABNORMAL LOW (ref 6.5–8.1)

## 2022-04-28 LAB — RETICULOCYTES
Immature Retic Fract: 33.5 % — ABNORMAL HIGH (ref 2.3–15.9)
RBC.: 2.69 MIL/uL — ABNORMAL LOW (ref 3.87–5.11)
Retic Count, Absolute: 46.3 10*3/uL (ref 19.0–186.0)
Retic Ct Pct: 1.7 % (ref 0.4–3.1)

## 2022-04-28 LAB — SAVE SMEAR(SSMR), FOR PROVIDER SLIDE REVIEW

## 2022-04-28 LAB — CK: Total CK: 1772 U/L — ABNORMAL HIGH (ref 38–234)

## 2022-04-28 LAB — PREPARE RBC (CROSSMATCH)

## 2022-04-28 MED ORDER — SODIUM CHLORIDE 0.9% IV SOLUTION: Freq: Once | INTRAVENOUS | Status: AC

## 2022-04-28 MED ORDER — HEPARIN SODIUM (PORCINE) 1000 UNIT/ML DIALYSIS
2000.0000 [IU] | INTRAMUSCULAR | Status: DC | PRN
  Filled 2022-04-28: qty 2

## 2022-04-28 NOTE — Progress Notes (Signed)
Patient ID: [name], female   DOB: [birth year], 56 y.o.   MRN: [id number] [facility] Progress Note   Assessment/ Plan:   1. Acute kidney Injury: Nonoliguric overnight with only slight worsening of renal function.  Etiology appears unclear at this time but not suspected to be related to her history of ANCA associated GPA based on recent rituximab administration in May and preceding corticosteroid use as well as urinalysis not suggestive of GN.  Started on hemodialysis for management of severe azotemia that was thought to be compounding her encephalopathy with subsequent improvement of altered mentation.  I will order for hemodialysis today for clearance of azotemia/without any ultrafiltration and monitor her for potential renal recovery over the weekend. 2.  Anemia: With some bleeding around dialysis catheter and rectal bleeding noted that appears to be the most likely culprits; ongoing PRBC transfusion. 3.  Altered mental status: Etiology unclear but seen by neurology who support lumbar puncture to help rule out any encephalitis/meningitis given preceding immunosuppressant use (will need to be done by IR under fluoroscopy as she has an intrathecal pump).  Encephalopathy improving with hemodialysis and decreased baclofen rate. 4.  Sepsis: Suspected to be of pneumonia/urinary tract infection etiology and previously on antibiotics that are currently on hold. 5.  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  Awaiting transfer to tertiary center for specialized care by rheumatology. 6.  COVID-19 virus infection: Incidental positive COVID PCR found at admission.  On corticosteroids primarily as a continuum of her preceding corticosteroid use.  Did not get remdesivir due to elevated LFTs; now off respiratory isolation.  Subjective:   She reports to be feeling better and denies any chest pain or shortness of breath.    Objective:   BP 116/78   Pulse (!) 101   Temp 98.7 F (37.1 C) (Oral)   Resp 20   Ht $R'5\' 5"'DU$  (1.651 m)   Wt 105.2 kg   SpO2 100%   BMI 38.59 kg/m   Intake/Output Summary (Last 24 hours) at 04/28/2022 0820 Last data filed at 04/28/2022 0500 Gross per 24 hour  Intake 1178.25 ml  Output 1500 ml  Net -321.75 ml   Weight change:   Physical Exam: Gen: Awake, alert, resting comfortably in bed.  Oriented and responding well to questions. CVS: Pulse regular rhythm, normal rate, S1 and S2 normal Resp: Transmitted breath sounds bilaterally without rales/rhonchi Abd: Soft, obese, nontender, bowel sounds normal Ext: Trace lower extremity edema.  Bilateral forefoot amputations noted  Imaging: DG Swallowing Func-Speech Pathology  Result Date: 04/27/2022 Table formatting from the original result was not included. Objective Swallowing Evaluation: Type of Study: MBS-Modified Barium Swallow Study  Patient Details Name: [name] MRN: [id number] Date of Birth: 08-10-1966 Today's Date: 04/27/2022 Time: SLP Start Time (ACUTE ONLY): 30 -SLP Stop Time (ACUTE ONLY): 1250 SLP Time Calculation (min) (ACUTE ONLY): 20 min Past Medical History: Past Medical History: Diagnosis Date  Chronic cough   Chronic pain   Diabetes mellitus without complication (HCC)   Dyspnea   GERD (gastroesophageal reflux disease)   Hypokalemia   Hypothyroidism   Left wrist pain 06/01/2021  Myonecrosis ([place]) 07/01/2021  Neurocardiogenic syncope   OSA (obstructive sleep apnea)   does not use CPAP  Osteomyelitis (HCC)   bilateral feet  Peripheral neuropathy   Presence of intrathecal pump   recieves Prialt/bupivicaine  Tear of gluteus medius tendon 07/01/2021  Wegener's granulomatosis 2009  Wegner's disease (congenital  syphilitic osteochondritis)  Past Surgical History: Past Surgical History: Procedure Laterality Date  AMPUTATION Right 10/21/2020  Procedure: Right hallux amputation;  Surgeon: Wylene Simmer, MD;  Location: [place];   Service: Orthopedics;  Laterality: Right;  AMPUTATION TOE Bilateral 08/12/2020  Procedure: Right 3rd toe amputation; left hallux and 3rd toe amputation;  Surgeon: Wylene Simmer, MD;  Location: [place];  Service: Orthopedics;  Laterality: Bilateral;  61min  AMPUTATION TOE Right 03/24/2021  Procedure: AMPUTATION TOE;  Surgeon: Wylene Simmer, MD;  Location: [place];  Service: Orthopedics;  Laterality: Right;  APPENDECTOMY    BACK SURGERY    BIOPSY  03/30/2022  Procedure: BIOPSY;  Surgeon: Laurin Coder, MD;  Location: [place];  Service: Pulmonary;;  BRONCHIAL WASHINGS  03/30/2022  Procedure: BRONCHIAL WASHINGS;  Surgeon: Laurin Coder, MD;  Location: [place];  Service: Pulmonary;;  CHOLECYSTECTOMY    COLONOSCOPY N/A 06/02/2013  Procedure: COLONOSCOPY;  Surgeon: Wonda Horner, MD;  Location: [facility] ENDOSCOPY;  Service: Endoscopy;  Laterality: N/A;  HERNIA REPAIR  2000  INCISION AND DRAINAGE ABSCESS Left 07/07/2016  Procedure: DEBRIDMENT LEFT THIGH ABSCESS, EXISION ACUTE SKIN RASH LEFT THIGH(1CM LESION);  Surgeon: Fanny Skates, MD;  Location: [facility];  Service: General;  Laterality: Left;  IR FLUORO GUIDE CV LINE RIGHT  04/24/2022  IR US GUIDE VASC ACCESS RIGHT  04/24/2022  SEPTOPLASTY  02/2013  spleen anuyism    splenic aneurysm    TRANSMETATARSAL AMPUTATION Bilateral 10/21/2020  Procedure: Left transmetatarsal amputation;  Surgeon: Wylene Simmer, MD;  Location: [place];  Service: Orthopedics;  Laterality: Bilateral;  TRANSMETATARSAL AMPUTATION Left 03/24/2021  Procedure: TRANSMETATARSAL AMPUTATION;  Surgeon: Wylene Simmer, MD;  Location: [place];  Service: Orthopedics;  Laterality: Left;  VAGINAL HYSTERECTOMY    VIDEO BRONCHOSCOPY Right 03/30/2022  Procedure: VIDEO BRONCHOSCOPY WITH FLUORO;  Surgeon: Laurin Coder, MD;  Location: [place] [facility];  Service: Pulmonary;  Laterality: Right;  atypical pneumonia HPI: 56 y.o. female with medical history significant of Wegener's angiitis, chronic  opioid dependency, hyperlipidemia, hypertension, impaired glucose tolerance, adrenal insufficiency on chronic prednisone usage, rheumatoid arthritis and multiple admissions secondary to sepsis in the setting of pneumonia and UTI.  Presented to the hospital [facility] on 04/17/22 with complaints of intermittent altered mental status changes at home and increased weakness.  Symptoms have been present for the last 2-3 days and worsening according to patient's husband.  Patient has just completed her antibiotic therapy from most recent admission about 1 week prior to presentation.  In the ED patient met criteria for severe sepsis with elevated WBCs, elevated lactic acid in the 4.7 range, acute renal failure as organ dysfunction and was also tachycardic and tachypneic;Subsequently found to be Covid+; transferred to [facility] on 04/22/22; ST evaluation on 04/21/22 indicating need for Dysphagia 2/honey-thickened liquids: CXR on 04/17/22 indicated Progressive interstitial pattern left lung and right upper lobe.  This may represent edema or infection; ST f/u for potential objective assessment/diet tolerance.  Subjective: pleasant, cooperative  Recommendations for follow up therapy are one component of a multi-disciplinary discharge planning process, led by the attending physician.  Recommendations may be updated based on patient status, additional functional criteria and insurance authorization. Assessment / Plan / Recommendation   04/27/2022   1:24 PM Clinical Impressions Clinical Impression Patient presents with a mild oropharyngeal dysphagia as per this MBS. During oral phase, the only difficulty she had was mastication of regular solids and mildly delayed oral transit of bolus. She was not  able to maintain bolus with 42mm barium tablet when attempted with thin liquids and then with puree solids and patient had to spit tablet out both times. No oral phase impairments observed with puree solids, thin liquids or nectar thick  liquids. During pharyngeal phase, she exhibited swallow initiation to level of vallecular sinus with regular solids puree solids, nectar thick liquids and swallow initiation delay to level of pyriform sinus with thin liquids. She exhibited silent aspiration during the swallow (PAS 8) with thin liquids of trace amount. Aspiration did not occur with each swallow of thin liquids, however deep penetration did occur with majority of swallows of thin liquids. No significant pharyngeal residuals observed post swallows with any of the tested consistencies. Cervical esophageal phase appeared [facility]. SLP Visit Diagnosis Dysphagia, oropharyngeal phase (R13.12) Impact on safety and function Mild aspiration risk;Moderate aspiration risk     04/27/2022   1:24 PM Treatment Recommendations Treatment Recommendations Therapy as outlined in treatment plan below     04/27/2022   1:30 PM Prognosis Prognosis for Safe Diet Advancement Good Barriers to Reach Goals Severity of deficits;Time post onset   04/27/2022   1:24 PM Diet Recommendations SLP Diet Recommendations Dysphagia 3 (Mech soft) solids;nectar thick liquid Liquid Administration via Straw;Cup Medication Administration Whole meds with puree Compensations Slow rate;Small sips/bites Postural Changes Seated upright at 90 degrees     04/27/2022   1:24 PM Other Recommendations Oral Care Recommendations Oral care BID Other Recommendations Order thickener from pharmacy;Remove water pitcher;Clarify dietary restrictions;Prohibited food (jello, ice cream, thin soups) Follow Up Recommendations Follow physician's recommendations for discharge plan and follow up therapies Assistance recommended at discharge Frequent or constant Supervision/Assistance   04/27/2022   1:24 PM Frequency and Duration  Speech Therapy Frequency (ACUTE ONLY) min 2x/week     04/27/2022   1:23 PM Oral Phase Oral Phase Impaired Oral - Regular Impaired mastication;Reduced posterior propulsion Oral - Pill Decreased bolus cohesion     04/27/2022   1:23 PM Pharyngeal Phase Pharyngeal Phase Impaired Pharyngeal- Nectar Cup Delayed swallow initiation-vallecula Pharyngeal- Thin Cup Delayed swallow initiation-pyriform sinuses;Reduced airway/laryngeal closure;Penetration/Aspiration during swallow;Trace aspiration Pharyngeal Material enters airway, passes BELOW cords without attempt by patient to eject out (silent aspiration) Pharyngeal- Puree Delayed swallow initiation-vallecula Pharyngeal- Regular Delayed swallow initiation-vallecula Pharyngeal- Pill NT    04/27/2022   1:24 PM Cervical Esophageal Phase  Cervical Esophageal Phase Darryll Capers Sonia Baller, MA, CCC-SLP Speech Therapy                      Labs: BMET Recent Labs  Lab 04/22/22 0336 04/23/22 1015 04/24/22 0816 04/25/22 0022 04/26/22 0350 04/27/22 0215 04/28/22 0400  NA 124* 126* 127* 134* 136 135 138  K 4.9 4.6 5.1 4.0 4.0 4.0 3.8  CL 90* 89* 90* 94* 94* 100 97*  CO2 17* 18* 17* $Remov'24 24 27 25  'Fmiewc$ GLUCOSE 148* 131* 81 100* 84 149* 160*  BUN 67* 83* 101* 62* 64* 34* 49*  CREATININE 6.23* 7.59* 8.35* 5.20* 5.45* 3.53* 4.30*  CALCIUM 6.2* 6.5* 6.3* 7.0* 6.9* 7.0* 7.4*  PHOS 7.7* 9.3* 11.2* 6.5* 7.4* 5.4* 6.2*   CBC Recent Labs  Lab 04/26/22 0350 04/27/22 0215 04/27/22 1130 04/27/22 2100 04/28/22 0400 04/28/22 0520  WBC 12.5* 14.8*  --   --  22.9* 19.5*  HGB 7.5* 5.8* 6.8* 7.2* 5.6* 6.3*  HCT 23.6* 18.7* 20.7* 21.1* 16.5* 18.6*  MCV 81.9 84.2  --   --  84.2 83.4  PLT 203 141*  --   --  114* 106*    Medications:     sodium chloride   Intravenous Once   sodium chloride   Intravenous Once   Chlorhexidine Gluconate Cloth  6 each Topical Daily   DULoxetine  30 mg Oral Daily   enoxaparin (LOVENOX) injection  110 mg Subcutaneous S50N   folic acid  2 mg Oral Daily   guaiFENesin-dextromethorphan  10 mL Oral Q12H   insulin aspart  0-9 Units Subcutaneous TID WC   insulin glargine-yfgn  4 Units Subcutaneous QHS   linaclotide  145 mcg Oral QAC breakfast   pantoprazole  40  mg Oral Daily   predniSONE  15 mg Oral Q supper   predniSONE  20 mg Oral Q breakfast   senna  2 tablet Oral BID   vitamin B-12  1,000 mcg Oral Daily    Elmarie Shiley, MD 04/28/2022, 8:20 AM

## 2022-04-28 NOTE — Progress Notes (Addendum)
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   Patient presents with worsening intermittent altered mental status and increased weakness.  She has been declining since approximately January, 6 months ago per husband.  She was recently admitted to our facility and treated for pneumonia with moderate improvement.  She was to continue on vancomycin through 04/08/2022 followed by infectious disease.  Bronchoscopy during that admission on 03/30/2022 shows Candida in the setting of ongoing antibiotics and urine culture ultimately grew yeast - deemed to be colonized and not active infection given fungitel was reassuringly low per ID discussion. Of note she is also had a recent diagnosis of pulmonary embolism on apixaban.   Patient admitted this hospitalization (on 04/17/22) with elevated white count, lactic acid, and acute renal failure in the setting of poor p.o. intake altered mental status tachycardia and tachypnea concerning for sepsis. Patient's infectious source was presumed to be pneumonia versus UTI - placed initially on broad-spectrum antibiotics of vancomycin and cefepime. Her sepsis criteria did not improve and her renal function continued to worsen in the setting of presumed sepsis and rhabdomyolysis (ATN). Nephrology following with worsening mental status, tremors/muscle spasms with metabolic derangements. Patient transferred to main campus for neurology evaluation, EEG ultimately unremarkable for seizures-her movement was considered to be musculoskeletal, somewhat choreiform in nature, holding off on LP per neuro given improvements with HD and intrathecal pump turned down (unable to be turned  off but can be turned to [facility]).  New events last 24 hours / Subjective: Hemoglobin dropped again overnight 6.3.  The bleeding around the dialysis catheter had stopped.  There was some minimal bright red bleeding in her rectal area (has history of hemorrhoids and fissure).  This morning, patient without any new complaints.  Her mentation continues to improve.  Assessment & Plan:   Principal Problem:   Acute metabolic encephalopathy Active Problems:   Acute cystitis without hematuria   Current chronic use of systemic steroids   Chronic pain   Secondary adrenal insufficiency (HCC)   AKI (acute kidney injury) ([place])   Granulomatosis with polyangiitis (HCC)   GERD (gastroesophageal reflux disease)   Essential hypertension   Obesity (BMI 30-39.9)   COVID-19 virus infection   Severe sepsis ([place])   Pulmonary embolism (HCC)   Transaminitis   Rhabdomyolysis    Acute metabolic encephalopathy, POA, multifactorial - Initially presumed to be secondary to sepsis/renal failure/hyponatremia  - Potential covid encephalitis, however spastic choreiform movements are not consistent with COVID encephalitis - MRI unremarkable for acute process  - EEG unremarkable for seizure - Discontinued all home CNS depressant medications - continued Cymbalta only - Mental status improving after initiating dialysis and discontinuation of intrathecal pain pump - Discussed with neurology.  Holding off on LP and CTA head at this time with improvement.  Neurology signed off 7/6  Acute blood loss anemia on anemia of chronic kidney disease, history of chronic iron deficiency anemia - Noted some bleeding from her dialysis catheter as well as minimal rectal bleeding with history of hemorrhoids and fissure - Transfused 2 unit packed red blood cell 7/6, 1 unit 7/7 - Followed by [facility] heme-onc with history of chronic normocytic anemia as well as iron deficiency anemia with hemoglobin ranging from 8.9-13.6.  Has  been getting  outpatient iron infusion - Followed by [facility].  Had normal colonoscopy 2014, flex sigmoidoscopy 2020 for rectal bleeding which revealed deep anal fissure and external hemorrhoids.  Underwent Botox, then lateral internal anal sphincterotomy.  EGD November 2022 was normal.  Colonoscopy revealed multiple diverticula, internal hemorrhoids, 3 polyps - Also check hemolysis labs - Hold lovenox  - Suspect this is likely due to either hemorrhoids/fissure/diverticula in setting of chronic iron deficiency and anemia of chronic disease. BP remains stable. If recurrent, can consider GI consult.  - Re-establishing GI with [place], has appointment with Dr. Rush Landmark 06/14/22   Polypharmacy risk - Home medications include bupivacaine/zinconotide intrathecal pump, duloxetine, gabapentin, methocarbamol, nucynta, oxycodone, Topamax   Chronic pain, neuropathy, and chronic opioid dependence  - Patient has intrathecal pump with bupivacaine and ziconotide - turned down to lowest setting 7/3 (cannot stop completely per tech) - Consider restarting low dose PO oxy vs nucynta   AKI/ATN currently requiring HD - Complicated and multifactorial etiology, possibly ATN per discussion with nephrology - Temp HD catheter initially placed 04/24/2022 - issues with bleeding ongoing - Nephro/IR to evaluate for dressing/suture replacement   Granulomatosis with polyangiitis  - Patient receiving rituximab and methotrexate as an outpatient (most recently at [facility] in May 2023) - confirmed with rheumatology at [facility] (Dr. Benjamine Mola) - Nephrology sent off multiple biomarkers: *Of note C4, and Kappa light chains* were elevated minimally but otherwise ANCA, ANA, RF, anti-dsDNA, c3, spep/sflc without abnormalities, hepatitis panel, HIV, EBV, CMV were unremarkable - Cryos still pending  - Continue prednisone 1m qAM and 189mqPM - continue to wean to baseline level (61m39maily)   Secondary adrenal insufficiency  -Continue steroids     Current chronic use of systemic steroids -On 5 mg daily at baseline.  Continue to wean   Acute hypoxic respiratory failure, POA -Multifactorial in the setting of metabolic encephalopathy, presumed pneumonia, granulomatosis/polyangiitis, concurrent COVID-19 infection -Potentially exacerbated by hypervolemia surrounding renal failure -Resolved and remains on room air   Presumed severe sepsis secondary to pneumonia versus UTI -Completed 5 days of antibiotic therapy  C. difficile colonization -C. difficile toxin is negative.  No indication of treatment at this time   GERD  -PPI    Rhabdomyolysis -Secondary to prolonged downtime -Improved   Elevated transaminitis -Most likely in the setting of sepsis/shock liver and rhabdomyolysis. -Improved   History of pulmonary embolism  -Lovenox on hold    COVID-19 virus infection, questionably incidental - COVID PCR incidentally positive at time of admission, without respiratory symptoms - Remdesivir withheld due to elevated liver enzymes - Completed 10-day quarantine  Diabetes mellitus -A1c 8.6 -Semglee, sliding scale insulin   Obesity  Estimated body mass index is 38.37 kg/m as calculated from the following:   Height as of this encounter: _0  (1.651 m).   Weight as of this encounter: 104.6 kg.     DVT prophylaxis: SCDs  Code Status: Full code Family Communication: No family at bedside this morning, updated husband over the phone  Disposition Plan:  Status is: Inpatient Remains inpatient appropriate because: Dialysis today and monitor over the weekend for renal recovery.  SNF placement pending.  Transfuse unit of blood today.   Antimicrobials:  Anti-infectives (From admission, onward)    Start     Dose/Rate Route Frequency Ordered Stop   04/21/22 2200  meropenem (MERREM) 500 mg in sodium chloride 0.9 % 100 mL IVPB  Status:  Discontinued        500 mg 200  mL/hr over 30 Minutes Intravenous Every 12 hours 04/21/22 1507  04/22/22 1945   04/21/22 1000  fluconazole (DIFLUCAN) tablet 100 mg  Status:  Discontinued        100 mg Oral Daily 04/20/22 0952 04/22/22 1945   04/20/22 1500  meropenem (MERREM) 1 g in sodium chloride 0.9 % 100 mL IVPB  Status:  Discontinued        1 g 200 mL/hr over 30 Minutes Intravenous Every 12 hours 04/20/22 1408 04/21/22 1507   04/20/22 1045  fluconazole (DIFLUCAN) tablet 200 mg        200 mg Oral  Once 04/20/22 0952 04/20/22 1207   04/20/22 1021  ceFEPIme (MAXIPIME) 2 g in sodium chloride 0.9 % 100 mL IVPB  Status:  Discontinued        2 g 200 mL/hr over 30 Minutes Intravenous Every 24 hours 04/19/22 1355 04/20/22 1334   04/19/22 1100  linezolid (ZYVOX) IVPB 600 mg  Status:  Discontinued        600 mg 300 mL/hr over 60 Minutes Intravenous Every 12 hours 04/19/22 1014 04/22/22 1945   04/18/22 1200  vancomycin (VANCOREADY) IVPB 750 mg/150 mL  Status:  Discontinued        750 mg 150 mL/hr over 60 Minutes Intravenous Every 24 hours 04/17/22 1306 04/19/22 1014   04/17/22 2200  ceFEPIme (MAXIPIME) 2 g in sodium chloride 0.9 % 100 mL IVPB  Status:  Discontinued        2 g 200 mL/hr over 30 Minutes Intravenous Every 12 hours 04/17/22 1306 04/19/22 1355   04/17/22 1300  vancomycin (VANCOREADY) IVPB 2000 mg/400 mL        2,000 mg 200 mL/hr over 120 Minutes Intravenous  Once 04/17/22 1208 04/17/22 1729   04/17/22 1200  ceFEPIme (MAXIPIME) 2 g in sodium chloride 0.9 % 100 mL IVPB        2 g 200 mL/hr over 30 Minutes Intravenous  Once 04/17/22 1153 04/17/22 1330   04/17/22 1200  metroNIDAZOLE (FLAGYL) IVPB 500 mg        500 mg 100 mL/hr over 60 Minutes Intravenous  Once 04/17/22 1153 04/17/22 1435   04/17/22 1200  vancomycin (VANCOCIN) IVPB 1000 mg/200 mL premix  Status:  Discontinued        1,000 mg 200 mL/hr over 60 Minutes Intravenous  Once 04/17/22 1153 04/17/22 1208        Objective: Vitals:   04/28/22 0353 04/28/22 0645 04/28/22 0658 04/28/22 0718  BP: 123/89 121/84 121/84  116/78  Pulse: (!) 103 (!) 102 (!) 102 (!) 101  Resp: _0 Temp: 99 F (37.2 C) 98.8 F (37.1 C) 98.8 F (37.1 C) 98.7 F (37.1 C)  TempSrc: Oral Oral Oral Oral  SpO2: 100% 100%  100%  Weight:      Height:        Intake/Output Summary (Last 24 hours) at 04/28/2022 1239 Last data filed at 04/28/2022 0500 Gross per 24 hour  Intake 998.25 ml  Output 1500 ml  Net -501.75 ml    Filed Weights   04/25/22 1509 04/26/22 1402 04/26/22 1914  Weight: 104.6 kg 105.2 kg 105.2 kg    Examination:  General exam: Appears calm and comfortable  Respiratory system: Clear to auscultation. Respiratory effort normal. No respiratory distress. No conversational dyspnea.  Cardiovascular system: S1 & S2 heard, RRR. No murmurs. No pedal edema. Gastrointestinal system: Abdomen is nondistended, soft and nontender. Normal bowel sounds heard. Central nervous system:  Alert and oriented to self, place, month and year.  Mentation continues to improve Extremities: Symmetric in appearance, bilateral TMA Skin: No rashes, lesions or ulcers on exposed skin  Psychiatry:  Mood & affect appropriate.   Data Reviewed: I have personally reviewed following labs and imaging studies  CBC: Recent Labs  Lab 04/25/22 0022 04/26/22 0350 04/27/22 0215 04/27/22 1130 04/27/22 2100 04/28/22 0400 04/28/22 0520  WBC 14.5* 12.5* 14.8*  --   --  22.9* 19.5*  HGB 8.3* 7.5* 5.8* 6.8* 7.2* 5.6* 6.3*  HCT 25.2* 23.6* 18.7* 20.7* 21.1* 16.5* 18.6*  MCV 80.3 81.9 84.2  --   --  84.2 83.4  PLT 254 203 141*  --   --  114* 106*    Basic Metabolic Panel: Recent Labs  Lab 04/24/22 0816 04/25/22 0022 04/26/22 0350 04/27/22 0215 04/28/22 0400  NA 127* 134* 136 135 138  K 5.1 4.0 4.0 4.0 3.8  CL 90* 94* 94* 100 97*  CO2 17* _0 GLUCOSE 81 100* 84 149* 160*  BUN 101* 62* 64* 34* 49*  CREATININE 8.35* 5.20* 5.45* 3.53* 4.30*  CALCIUM 6.3* 7.0* 6.9* 7.0* 7.4*  PHOS 11.2* 6.5* 7.4* 5.4* 6.2*     GFR: Estimated Creatinine Clearance: 17.6 mL/min (A) (by C-G formula based on SCr of 4.3 mg/dL (H)). Liver Function Tests: Recent Labs  Lab 04/22/22 2031 04/23/22 1015 04/24/22 0816 04/25/22 0022 04/26/22 0350 04/27/22 0215 04/28/22 0400  AST 122*  --   --  95* 72* 54* 52*  ALT 120*  --   --  77* 54* 40 34  ALKPHOS 73  --   --  67 56 54 44  BILITOT 1.0  --   --  0.7 1.1 1.0 1.1  PROT 4.6*  --   --  5.0* 4.4* 4.1* 4.1*  ALBUMIN 1.8*   < > 1.9* 1.9*  1.9* 1.8*  1.8* 1.6*  1.6* 1.7*  1.7*   < > = values in this interval not displayed.    No results for input(s): "LIPASE", "AMYLASE" in the last 168 hours. No results for input(s): "AMMONIA" in the last 168 hours. Coagulation Profile: No results for input(s): "INR", "PROTIME" in the last 168 hours. Cardiac Enzymes: Recent Labs  Lab 04/24/22 0816 04/25/22 0022 04/26/22 0350 04/27/22 0215 04/28/22 0400  CKTOTAL 4,049* 3,558* 2,171* 1,035* 1,772*    BNP (last 3 results) No results for input(s): "PROBNP" in the last 8760 hours. HbA1C: No results for input(s): "HGBA1C" in the last 72 hours. CBG: Recent Labs  Lab 04/27/22 0650 04/27/22 1143 04/27/22 1843 04/27/22 2126 04/28/22 0947  GLUCAP 157* 177* 192* 191* 139*    Lipid Profile: No results for input(s): "CHOL", "HDL", "LDLCALC", "TRIG", "CHOLHDL", "LDLDIRECT" in the last 72 hours. Thyroid Function Tests: No results for input(s): "TSH", "T4TOTAL", "FREET4", "T3FREE", "THYROIDAB" in the last 72 hours. Anemia Panel: No results for input(s): "VITAMINB12", "FOLATE", "FERRITIN", "TIBC", "IRON", "RETICCTPCT" in the last 72 hours. Sepsis Labs: No results for input(s): "PROCALCITON", "LATICACIDVEN" in the last 168 hours.  Recent Results (from the past 240 hour(s))  Gastrointestinal Panel by PCR , Stool     Status: None   Collection Time: 04/23/22  3:55 PM   Specimen: Stool  Result Value Ref [place] Status   Campylobacter species NOT DETECTED NOT DETECTED Final    Plesimonas shigelloides NOT DETECTED NOT DETECTED Final   Salmonella species NOT DETECTED NOT DETECTED Final   Yersinia enterocolitica NOT DETECTED NOT DETECTED Final  Vibrio species NOT DETECTED NOT DETECTED Final   Vibrio cholerae NOT DETECTED NOT DETECTED Final   Enteroaggregative E coli (EAEC) NOT DETECTED NOT DETECTED Final   Enteropathogenic E coli (EPEC) NOT DETECTED NOT DETECTED Final   Enterotoxigenic E coli (ETEC) NOT DETECTED NOT DETECTED Final   Shiga like toxin producing E coli (STEC) NOT DETECTED NOT DETECTED Final   Shigella/Enteroinvasive E coli (EIEC) NOT DETECTED NOT DETECTED Final   Cryptosporidium NOT DETECTED NOT DETECTED Final   Cyclospora cayetanensis NOT DETECTED NOT DETECTED Final   Entamoeba histolytica NOT DETECTED NOT DETECTED Final   Giardia lamblia NOT DETECTED NOT DETECTED Final   Adenovirus F40/41 NOT DETECTED NOT DETECTED Final   Astrovirus NOT DETECTED NOT DETECTED Final   Norovirus GI/GII NOT DETECTED NOT DETECTED Final   Rotavirus A NOT DETECTED NOT DETECTED Final   Sapovirus (I, II, IV, and V) NOT DETECTED NOT DETECTED Final    Comment: Performed at [facility], [address] [place], [place] [address]  C Difficile Quick Screen (NO PCR Reflex)     Status: Abnormal   Collection Time: 04/23/22  3:57 PM   Specimen: Stool  Result Value Ref [place] Status   C Diff antigen POSITIVE (A) NEGATIVE Final   C Diff toxin NEGATIVE NEGATIVE Final   C Diff interpretation   Final    Results are indeterminate. Please contact the provider listed for your campus for C diff questions in [place].    Comment: Performed at [facility], [place] [address], [place], [place] [address]      Radiology Studies: DG Swallowing Func-Speech Pathology  Result Date: 04/27/2022 Table formatting from the original result was not included. Objective Swallowing Evaluation: Type of Study: MBS-Modified Barium Swallow Study  Patient Details Name: [name] MRN: [id number]  Date of Birth: 1965-10-25 Today's Date: 04/27/2022 Time: SLP Start Time (ACUTE ONLY): 75 -SLP Stop Time (ACUTE ONLY): 1250 SLP Time Calculation (min) (ACUTE ONLY): 20 min Past Medical History: Past Medical History: Diagnosis Date  Chronic cough   Chronic pain   Diabetes mellitus without complication (HCC)   Dyspnea   GERD (gastroesophageal reflux disease)   Hypokalemia   Hypothyroidism   Left wrist pain 06/01/2021  Myonecrosis ([place]) 07/01/2021  Neurocardiogenic syncope   OSA (obstructive sleep apnea)   does not use CPAP  Osteomyelitis (HCC)   bilateral feet  Peripheral neuropathy   Presence of intrathecal pump   recieves Prialt/bupivicaine  Tear of gluteus medius tendon 07/01/2021  Wegener's granulomatosis 2009  Wegner's disease (congenital syphilitic osteochondritis)  Past Surgical History: Past Surgical History: Procedure Laterality Date  AMPUTATION Right 10/21/2020  Procedure: Right hallux amputation;  Surgeon: Wylene Simmer, MD;  Location: [place];  Service: Orthopedics;  Laterality: Right;  AMPUTATION TOE Bilateral 08/12/2020  Procedure: Right 3rd toe amputation; left hallux and 3rd toe amputation;  Surgeon: Wylene Simmer, MD;  Location: [place];  Service: Orthopedics;  Laterality: Bilateral;  62mn  AMPUTATION TOE Right 03/24/2021  Procedure: AMPUTATION TOE;  Surgeon: HWylene Simmer MD;  Location: [place]  Service: Orthopedics;  Laterality: Right;  APPENDECTOMY    BACK SURGERY    BIOPSY  03/30/2022  Procedure: BIOPSY;  Surgeon: OLaurin Coder MD;  [facility];  Service: Pulmonary;;  BRONCHIAL WASHINGS  03/30/2022  Procedure: BRONCHIAL WASHINGS;  Surgeon: OLaurin Coder MD;  [facility];  Service: Pulmonary;;  CHOLECYSTECTOMY    COLONOSCOPY N/A 06/02/2013  Procedure: COLONOSCOPY;  Surgeon: SWonda Horner  MD;  Location: [facility];  Service: Endoscopy;  Laterality: N/A;  HERNIA REPAIR  2000  INCISION AND DRAINAGE ABSCESS Left 07/07/2016  Procedure: DEBRIDMENT  LEFT THIGH ABSCESS, EXISION ACUTE SKIN RASH LEFT THIGH(1CM LESION);  Surgeon: Fanny Skates, MD;  [facility]: [facility];  Service: General;  Laterality: Left;  IR FLUORO GUIDE CV LINE RIGHT  04/24/2022  IR US GUIDE VASC ACCESS RIGHT  04/24/2022  SEPTOPLASTY  02/2013  spleen anuyism    splenic aneurysm    TRANSMETATARSAL AMPUTATION Bilateral 10/21/2020  Procedure: Left transmetatarsal amputation;  Surgeon: Wylene Simmer, MD;  Location: [place];  Service: Orthopedics;  Laterality: Bilateral;  TRANSMETATARSAL AMPUTATION Left 03/24/2021  Procedure: TRANSMETATARSAL AMPUTATION;  Surgeon: Wylene Simmer, MD;  Location: [place];  Service: Orthopedics;  Laterality: Left;  VAGINAL HYSTERECTOMY    VIDEO BRONCHOSCOPY Right 03/30/2022  Procedure: VIDEO BRONCHOSCOPY WITH FLUORO;  Surgeon: Laurin Coder, MD;  Location: [facility];  Service: Pulmonary;  Laterality: Right;  atypical pneumonia HPI: 56 y.o. female with medical history significant of Wegener's angiitis, chronic opioid dependency, hyperlipidemia, hypertension, impaired glucose tolerance, adrenal insufficiency on chronic prednisone usage, rheumatoid arthritis and multiple admissions secondary to sepsis in the setting of pneumonia and UTI.  Presented to the hospital [facility]) on 04/17/22 with complaints of intermittent altered mental status changes at home and increased weakness.  Symptoms have been present for the last 2-3 days and worsening according to patient's husband.  Patient has just completed her antibiotic therapy from most recent admission about 1 week prior to presentation.  In the ED patient met criteria for severe sepsis with elevated WBCs, elevated lactic acid in the 4.7 range, acute renal failure as organ dysfunction and was also tachycardic and tachypneic;Subsequently found to be Covid+; transferred to [facility] on 04/22/22; ST evaluation on 04/21/22 indicating need for Dysphagia 2/honey-thickened liquids: CXR on 04/17/22 indicated Progressive  interstitial pattern left lung and right upper lobe.  This may represent edema or infection; ST f/u for potential objective assessment/diet tolerance.  Subjective: pleasant, cooperative  Recommendations for follow up therapy are one component of a multi-disciplinary discharge planning process, led by the attending physician.  Recommendations may be updated based on patient status, additional functional criteria and insurance authorization. Assessment / Plan / Recommendation   04/27/2022   1:24 PM Clinical Impressions Clinical Impression Patient presents with a mild oropharyngeal dysphagia as per this MBS. During oral phase, the only difficulty she had was mastication of regular solids and mildly delayed oral transit of bolus. She was not able to maintain bolus with 33m barium tablet when attempted with thin liquids and then with puree solids and patient had to spit tablet out both times. No oral phase impairments observed with puree solids, thin liquids or nectar thick liquids. During pharyngeal phase, she exhibited swallow initiation to level of vallecular sinus with regular solids puree solids, nectar thick liquids and swallow initiation delay to level of pyriform sinus with thin liquids. She exhibited silent aspiration during the swallow (PAS 8) with thin liquids of trace amount. Aspiration did not occur with each swallow of thin liquids, however deep penetration did occur with majority of swallows of thin liquids. No significant pharyngeal residuals observed post swallows with any of the tested consistencies. Cervical esophageal phase appeared [facility] Visit Diagnosis Dysphagia, oropharyngeal phase (R13.12) Impact on safety and function Mild aspiration risk;Moderate aspiration risk     04/27/2022   1:24 PM Treatment Recommendations Treatment Recommendations Therapy as outlined in treatment plan below  04/27/2022   1:30 PM Prognosis Prognosis for Safe Diet Advancement Good Barriers to Reach Goals Severity of  deficits;Time post onset   04/27/2022   1:24 PM Diet Recommendations SLP Diet Recommendations Dysphagia 3 (Mech soft) solids;nectar thick liquid Liquid Administration via Straw;Cup Medication Administration Whole meds with puree Compensations Slow rate;Small sips/bites Postural Changes Seated upright at 90 degrees     04/27/2022   1:24 PM Other Recommendations Oral Care Recommendations Oral care BID Other Recommendations Order thickener from pharmacy;Remove water pitcher;Clarify dietary restrictions;Prohibited food (jello, ice cream, thin soups) Follow Up Recommendations Follow physician's recommendations for discharge plan and follow up therapies Assistance recommended at discharge Frequent or constant Supervision/Assistance   04/27/2022   1:24 PM Frequency and Duration  Speech Therapy Frequency (ACUTE ONLY) min 2x/week     04/27/2022   1:23 PM Oral Phase Oral Phase Impaired Oral - Regular Impaired mastication;Reduced posterior propulsion Oral - Pill Decreased bolus cohesion    04/27/2022   1:23 PM Pharyngeal Phase Pharyngeal Phase Impaired Pharyngeal- Nectar Cup Delayed swallow initiation-vallecula Pharyngeal- Thin Cup Delayed swallow initiation-pyriform sinuses;Reduced airway/laryngeal closure;Penetration/Aspiration during swallow;Trace aspiration Pharyngeal Material enters airway, passes BELOW cords without attempt by patient to eject out (silent aspiration) Pharyngeal- Puree Delayed swallow initiation-vallecula Pharyngeal- Regular Delayed swallow initiation-vallecula Pharyngeal- Pill NT    04/27/2022   1:24 PM Cervical Esophageal Phase  Cervical Esophageal Phase [facility] Sonia Baller, MA, CCC-SLP Speech Therapy                        Scheduled Meds:  sodium chloride   Intravenous Once   Chlorhexidine Gluconate Cloth  6 each Topical Daily   DULoxetine  30 mg Oral Daily   enoxaparin (LOVENOX) injection  110 mg Subcutaneous Q25Z   folic acid  2 mg Oral Daily   guaiFENesin-dextromethorphan  10 mL Oral Q12H   insulin  aspart  0-9 Units Subcutaneous TID WC   insulin glargine-yfgn  4 Units Subcutaneous QHS   linaclotide  145 mcg Oral QAC breakfast   pantoprazole  40 mg Oral Daily   predniSONE  15 mg Oral Q supper   predniSONE  20 mg Oral Q breakfast   senna  2 tablet Oral BID   vitamin B-12  1,000 mcg Oral Daily   Continuous Infusions:  anticoagulant sodium citrate       LOS: 11 days     Dessa Phi, DO [facility] 04/28/2022, 12:39 PM   Available via Epic secure chat 7am-7pm After these hours, please refer to coverage provider listed on [email or web address]

## 2022-04-28 NOTE — Evaluation (Signed)
Physical Therapy Evaluation Patient Details Name: [name] MRN: [id number] DOB: 1966-06-14 Today's Date: 04/28/2022  History of Present Illness  Pt is a 56 y.o. female admitted 6/26 after being found by her husband on the floor at home with AMS. Admitted with dx of acute metabolic encephalopathy. Incidental COVID+ on admission. Initially admitted to [facility] and transferred to [facility] 7/1 for initiation of HD. Multiple recent hospitalizations, 4/3-5/4 and 6/4-6/14. PMH: PE, Wegener's disease, CKD 3, DM, HTN, chronic pain with an intrathecal pump, OSA, bilat mid-foot amputations, peripheral neuropathy   Clinical Impression  Pt admitted with above diagnosis. PTA pt lived at home with her husband, mod I mobility using RW vs electric scooter.  Pt currently with functional limitations due to the deficits listed below (see PT Problem List). On eval, pt required max assist bed mobility. Pt significantly limited by pain requiring return to supine before completing transition to EOB. Pt very edematous, reporting abdominal pressure and pain in abdomen, hips, and legs. Pt will benefit from skilled PT to increase their independence and safety with mobility to allow discharge to the venue listed below.          Recommendations for follow up therapy are one component of a multi-disciplinary discharge planning process, led by the attending physician.  Recommendations may be updated based on patient status, additional functional criteria and insurance authorization.  Follow Up Recommendations Skilled nursing-short term rehab (<3 hours/day) Can patient physically be transported by private vehicle: No    Assistance Recommended at Discharge Frequent or constant Supervision/Assistance  Patient can return home with the following  Two people to help with walking and/or transfers;Assistance with cooking/housework;Assist for transportation;Two people to help with bathing/dressing/bathroom;Help with stairs or ramp for  entrance    Equipment Recommendations Other (comment) (hoyer lift)  Recommendations for Other Services       Functional Status Assessment Patient has had a recent decline in their functional status and/or demonstrates limited ability to make significant improvements in function in a reasonable and predictable amount of time     Precautions / Restrictions Precautions Precautions: Fall;Other (comment) Precaution Comments: h/o bilateral transmet amputations, foley catheter, flexiseal, central line      Mobility  Bed Mobility Overal bed mobility: Needs Assistance Bed Mobility: Supine to Sit     Supine to sit: Max assist, HOB elevated     General bed mobility comments: attempted supine to sit. Limited by pain requiring return to supine.    Transfers                        Ambulation/Gait                  Stairs            Wheelchair Mobility    Modified Rankin (Stroke Patients Only)       Balance                                             Pertinent Vitals/Pain Pain Assessment Pain Assessment: 0-10 Pain Score: 10-Worst pain ever (12 per pt) Pain Location: abdomen, hips, LEs Pain Descriptors / Indicators: Discomfort, Tender, Pressure, Grimacing, Guarding Pain Intervention(s): Limited activity within patient's tolerance, Monitored during session, Repositioned    Home Living Family/patient expects to be discharged to:: Private residence Living Arrangements: Spouse/significant other Available Help at Discharge: Family;Available  PRN/intermittently Type of Home: House Home Access: Stairs to enter Entrance Stairs-Rails: None Entrance Stairs-Number of Steps: 2   Home Layout: One level Home Equipment: Insurance risk surveyor (2 wheels);BSC/3in1;Cane - single point;Tub bench;Grab bars - tub/shower Additional Comments: adjustable bed    Prior Function Prior Level of Function : Needs assist       Physical Assist :  Mobility (physical);ADLs (physical) Mobility (physical): Transfers;Gait;Stairs;Bed mobility ADLs (physical): Dressing;Toileting;IADLs;Bathing Mobility Comments: Amb household distances with RW but primarily uses electric scooter in house and community ADLs Comments: Assisted for bathing and dressing. Pt able to complete toileting without assist. Grooming and feeding are independent. Assisted for IADL's by husband.     Hand Dominance   Dominant Hand: Left    Extremity/Trunk Assessment   Upper Extremity Assessment Upper Extremity Assessment: Defer to OT evaluation    Lower Extremity Assessment Lower Extremity Assessment: Generalized weakness;RLE deficits/detail;LLE deficits/detail RLE Deficits / Details: tender to touch, edematous and stiff, h/o midfoot amp RLE: Unable to fully assess due to pain LLE Deficits / Details: tender to touch, edematous and stiff, h/o midfoot amp LLE: Unable to fully assess due to pain       Communication   Communication: No difficulties  Cognition Arousal/Alertness: Awake/alert Behavior During Therapy: WFL for tasks assessed/performed Overall Cognitive Status: Within Functional Limits for tasks assessed                                 General Comments: a littel slow to respond but answering questions appropriately        General Comments General comments (skin integrity, edema, etc.): SpO2 96% on RA. HR in 100s. Just finished receiving 1 unit PRBCs due to Hgb 6.3 this AM.    Exercises     Assessment/Plan    PT Assessment Patient needs continued PT services  PT Problem List Decreased strength;Decreased mobility;Decreased activity tolerance;Pain;Decreased balance       PT Treatment Interventions DME instruction;Gait training;Functional mobility training;Therapeutic activities;Therapeutic exercise;Balance training;Patient/family education    PT Goals (Current goals can be found in the Care Plan section)  Acute Rehab PT  Goals Patient Stated Goal: home PT Goal Formulation: With patient Time For Goal Achievement: 05/12/22 Potential to Achieve Goals: Fair    Frequency Min 3X/week     Co-evaluation               AM-PAC PT "6 Clicks" Mobility  Outcome Measure Help needed turning from your back to your side while in a flat bed without using bedrails?: Total Help needed moving from lying on your back to sitting on the side of a flat bed without using bedrails?: Total Help needed moving to and from a bed to a chair (including a wheelchair)?: Total Help needed standing up from a chair using your arms (e.g., wheelchair or bedside chair)?: Total Help needed to walk in hospital room?: Total Help needed climbing 3-5 steps with a railing? : Total 6 Click Score: 6    End of Session   Activity Tolerance: Patient limited by pain Patient left: in bed;with call bell/phone within reach Nurse Communication: Mobility status PT Visit Diagnosis: Muscle weakness (generalized) (M62.81);Other abnormalities of gait and mobility (R26.89)    Time: 4098-1191 PT Time Calculation (min) (ACUTE ONLY): 13 min   Charges:   PT Evaluation $PT Eval Moderate Complexity: 1 Mod          Aida Raider, PT  Office # [phone] Pager #  [phone]   Ilda Foil 04/28/2022, 10:26 AM

## 2022-04-28 NOTE — Progress Notes (Signed)
OT Cancellation Note  Patient Details Name: [name] MRN: [id number] DOB: [birth year]   Cancelled Treatment:    Reason Eval/Treat Not Completed: Patient at procedure or test/ unavailable (Leaving for HD. Will return as schedule allows.)  Alisandra Son M Sundus Pete Nara [place] MSOT, OTR/L Acute Rehab Office: [phone] 04/28/2022, 4:20 PM

## 2022-04-28 NOTE — TOC Initial Note (Signed)
Transition of Care [facility]) - Initial/Assessment Note    Patient Details  Name: [name] MRN: [id number] Date of Birth: [birth year]  [facility] CM/SW Contact:    Coralee Pesa, [place] Phone Number: 04/28/2022, 5:41 PM  Clinical Narrative:                 Pt is unavailable at this time, CSW spoke with spouse who has provided most collateral information. Spouse is agreeable to SNF and advised of Medicare.gov information. Spouse prefers a facility in [place], referral's sent. Spouse is also interested in CIR if pt is able to progress to it. All questions were answered. TOC will continue to follow for DC needs.  Expected Discharge Plan: Skilled Nursing Facility Barriers to Discharge: Insurance Authorization, SNF Pending bed offer, Continued Medical Work up   Patient Goals and CMS Choice Patient states their goals for this hospitalization and ongoing recovery are:: Pt unavailable CMS Medicare.gov Compare Post Acute Care list provided to:: Patient Represenative (must comment) Choice offered to / list presented to : Patient, Spouse  Expected Discharge Plan and Services Expected Discharge Plan: [place] In-house Referral: Clinical Social Work Discharge Planning Services: CM Consult Post Acute Care Choice: [place] arrangements for the past 2 months: [place]                                      Prior Living Arrangements/Services Living arrangements for the past 2 months: Single Family Home Lives with:: Spouse Patient language and need for interpreter reviewed:: Yes Do you feel safe going back to the place where you live?: Yes      Need for Family Participation in Patient Care: Yes (Comment) Care giver support system in place?: Yes (comment) Current home services: DME Criminal Activity/Legal Involvement Pertinent to Current Situation/Hospitalization: No - Comment as needed  Activities of Daily Living Home  Assistive Devices/Equipment: Bedside commode/3-in-1, Electric scooter, Built-in shower seat, Environmental consultant (specify type), Shower chair with back, Cane (specify quad or straight) ADL Screening (condition at time of admission) Patient's cognitive ability adequate to safely complete daily activities?: Yes Is the patient deaf or have difficulty hearing?: No Does the patient have difficulty seeing, even when wearing glasses/contacts?: No Does the patient have difficulty concentrating, remembering, or making decisions?: Yes Patient able to express need for assistance with ADLs?: Yes Does the patient have difficulty dressing or bathing?: Yes Independently performs ADLs?: No Communication: Independent Dressing (OT): Needs assistance Is this a change from baseline?: Pre-admission baseline Grooming: Needs assistance Is this a change from baseline?: Pre-admission baseline Feeding: Independent Bathing: Needs assistance Is this a change from baseline?: Pre-admission baseline Toileting: Needs assistance Is this a change from baseline?: Pre-admission baseline In/Out Bed: Needs assistance Is this a change from baseline?: Pre-admission baseline Walks in Home: Needs assistance Is this a change from baseline?: Pre-admission baseline Does the patient have difficulty walking or climbing stairs?: Yes Weakness of Legs: Both Weakness of Arms/Hands: None  Permission Sought/Granted Permission sought to share information with : Family Supports Permission granted to share information with : Yes, Verbal Permission Granted  Share Information with NAME: Liam Rogers     Permission granted to share info w Relationship: Spouse  Permission granted to share info w Contact Information: [phone]  Emotional Assessment Appearance:: Appears stated age Attitude/Demeanor/Rapport: Unable to Assess Affect (typically observed): Unable to Assess Orientation: : Oriented to Self, Oriented  to Place, Oriented to  Situation Alcohol / Substance Use: Not Applicable Psych Involvement: No (comment)  Admission diagnosis:  Weakness [R53.1] AKI (acute kidney injury) (HCC) [N17.9] Severe sepsis (HCC) [A41.9, R65.20] 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, 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, 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] Patient Active Problem List   Diagnosis Date Noted   Rhabdomyolysis 04/18/2022   Transaminitis 04/17/2022   Recurrent pneumonia 04/12/2022   Pulmonary embolism (HCC) 03/28/2022   Severe sepsis (HCC) 03/26/2022   UTI (urinary tract infection) 03/26/2022   Opioid dependence (HCC) 03/26/2022   Acute metabolic encephalopathy 03/26/2022   Hip pain 02/20/2022   Arthritis of right hip 02/19/2022   Hypothyroidism    AKI (acute kidney injury) (HCC)    Class 2 obesity    Depression    Essential hypertension    Acute cystitis without hematuria 01/04/2022   Dysfunction of left rotator cuff 01/04/2022   Lobar pneumonia (HCC) 01/04/2022   Acquired absence of other toe(s), unspecified side (HCC) 08/01/2021   Myositis of right lower extremity 07/01/2021   Tear of gluteus medius tendon 07/01/2021   Left wrist pain 06/01/2021   Medication monitoring encounter 05/10/2021   Chronic pain 03/24/2021   COVID-19 virus infection 11/01/2020   Stage 3a chronic kidney disease (HCC) 09/08/2019   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   Steroid-induced hyperglycemia 05/11/2018   Morbid obesity (HCC) 01/25/2018   Neuropathy 01/25/2018   Presence of intrathecal pump 08/02/2017   Rectocele 03/09/2017   Abnormal finding on imaging 12/19/2016   Esophageal dysphagia 12/19/2016   Current chronic use of systemic steroids 10/26/2016   IFG (impaired fasting glucose) 10/26/2016   Arthralgia of multiple joints 07/15/2016   Headache 07/15/2016   Peripheral neuropathy 07/04/2016   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   Neuropathic pain of both feet 01/11/2016   OSA (obstructive sleep apnea) 12/29/2015   Chronic  insomnia 12/29/2015   Obesity, Class II, BMI 35-39.9, with comorbidity 12/29/2015   Polyneuropathy associated with underlying disease (HCC) 12/29/2015   Class 2 drug-induced obesity with serious comorbidity and body mass index (BMI) of 35.0 to 35.9 in adult 12/29/2015   Other mechanical complication of implanted electronic neurostimulator of spinal cord electrode (lead), initial encounter (HCC) 08/04/2015   Adjustment disorder with depressed mood 03/29/2015   Mononeuritis 07/20/2014   Obesity (BMI 30-39.9) 07/20/2014   Neurocardiogenic syncope 07/06/2014   Normocytic anemia 06/08/2014   POTS (postural orthostatic tachycardia syndrome) 06/08/2014   Encounter for long-term (current) use of high-risk medication 05/30/2013   Fall 05/30/2013   Other long term (current) drug therapy 05/30/2013   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/30/2013   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/2013   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   Congenital syphilitic osteochondritis    GERD (gastroesophageal reflux disease) 03/07/2012   Other diseases of trachea and bronchus 08/30/2011   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   Atrophy of nasal turbinates 07/25/2011   Nasal septal perforation 07/25/2011   Cough 03/24/2011   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   Subglottic stenosis 03/24/2011   Granulomatosis with polyangiitis (HCC) 2009   PCP:  [name], [name] Pharmacy:   [facility] - [place], [place] - [address] [place] [address] Phone: [phone] Fax: [phone]     Social Determinants of Health (SDOH) Interventions    Readmission Risk Interventions    04/18/2022    4:11 PM 04/05/2022    2:33 PM 03/27/2022   10:12 AM  Readmission Risk Prevention Plan  Transportation Screening Complete Complete Complete  Medication Review (  RN Care Manager) Complete Complete Complete  PCP or Specialist appointment within 3-5 days of  discharge Complete Complete   HRI or Home Care Consult Complete Complete Complete  SW Recovery Care/Counseling Consult Complete Complete Complete  Palliative Care Screening Not Applicable Not Applicable Not Applicable  Skilled Nursing Facility Not Applicable Not Applicable Not Applicable

## 2022-04-28 NOTE — Progress Notes (Signed)
Received patient in bed, alert and oriented. Informed consent signed and in chart.  Time tx completed: 2113 Total tx time: 3.5hrs  HD treatment completed. At 1940 patient's BP dropped to 95/75, given bolus NSS. BP rechecked at 1950 was 86/66, UF turned off, given bolus NSS.UF remained off towards the end of treatment. BP was stable SBP in the low 100's.  CVC was flushed several times during this treatment. Every time the patient talks or coughs, the machine alarms and wont run unless CVC was flushed.  Patient was very anxious during treatment and holding her hand gave her comfort to run her entire treatment time.  HD catheter without signs and symptoms of complications. Patient transported back to the room, alert and orient and in no acute distress. Report given to bedside RN.  Total UF removed: 0  Heparin fills 1.66ml  Post HD VS: T97.8-113/87-98-23-100% @2L  [place]  Post HD weight: unable to obtain post weight

## 2022-04-28 NOTE — [place] FL2 (Signed)
[place] MEDICAID FL2 LEVEL OF CARE SCREENING TOOL     IDENTIFICATION  Patient Name: [name] Birthdate: [birth year] Sex: female Admission Date (Current Location): 04/17/2022  [facility] and [place] Number:  Herbalist and Address:  [facility], [place] [address], [place], [place] [address]      Provider Number: [id number]  Attending Physician Name and Address:  Dessa Phi, DO  Relative Name and Phone Number:  Emmery Wetherald,  [phone]    Current Level of Care: Hospital Recommended Level of Care: [place] Prior Approval Number:    Date Approved/Denied:   PASRR Number: [id number]  Discharge Plan: SNF    Current Diagnoses: Patient Active Problem List   Diagnosis Date Noted   Rhabdomyolysis 04/18/2022   Transaminitis 04/17/2022   Recurrent pneumonia 04/12/2022   Pulmonary embolism ([place]) 03/28/2022   Severe sepsis ([place]) 03/26/2022   UTI (urinary tract infection) 03/26/2022   Opioid dependence ([place]) [id number]   Acute metabolic encephalopathy [id number]   Hip pain 02/20/2022   Arthritis of right hip 02/19/2022   Hypothyroidism    AKI (acute kidney injury) ([place])    Class 2 obesity    Depression    Essential hypertension    Acute cystitis without hematuria 01/04/2022   Dysfunction of left rotator cuff 01/04/2022   Lobar pneumonia ([place]) 01/04/2022   Acquired absence of other toe(s), unspecified side ([place]) 08/01/2021   Myositis of right lower extremity 07/01/2021   Tear of gluteus medius tendon 07/01/2021   Left wrist pain 06/01/2021   Medication monitoring encounter 05/10/2021   Chronic pain 03/24/2021   COVID-19 virus infection 11/01/2020   Stage 3a chronic kidney disease ([place]) 09/08/2019   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   Steroid-induced hyperglycemia 05/11/2018   Morbid obesity ([place]) 01/25/2018   Neuropathy 01/25/2018   Presence of intrathecal pump 08/02/2017   Rectocele 03/09/2017   Abnormal  finding on imaging 12/19/2016   Esophageal dysphagia 12/19/2016   Current chronic use of systemic steroids 10/26/2016   IFG (impaired fasting glucose) 10/26/2016   Arthralgia of multiple joints 07/15/2016   Headache 07/15/2016   Peripheral neuropathy 07/04/2016   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   Neuropathic pain of both feet 01/11/2016   OSA (obstructive sleep apnea) 12/29/2015   Chronic insomnia 12/29/2015   Obesity, Class II, BMI 35-39.9, with comorbidity 12/29/2015   Polyneuropathy associated with underlying disease ([place]) 12/29/2015   Class 2 drug-induced obesity with serious comorbidity and body mass index (BMI) of 35.0 to 35.9 in adult 12/29/2015   Other mechanical complication of implanted electronic neurostimulator of spinal cord electrode (lead), initial encounter ([place]) 08/04/2015   Adjustment disorder with depressed mood 03/29/2015   Mononeuritis 07/20/2014   Obesity (BMI 30-39.9) 07/20/2014   Neurocardiogenic syncope 07/06/2014   Normocytic anemia 06/08/2014   POTS (postural orthostatic tachycardia syndrome) 06/08/2014   Encounter for long-term (current) use of high-risk medication 05/30/2013   Fall 05/30/2013   Other long term (current) drug therapy 05/30/2013   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[place]) 05/30/2013   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[place]) 05/30/2013   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   Congenital syphilitic osteochondritis    GERD (gastroesophageal reflux disease) 03/07/2012   Other diseases of trachea and bronchus 08/30/2011   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   Atrophy of nasal turbinates 07/25/2011   Nasal septal perforation 07/25/2011   Cough 03/24/2011   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   Subglottic stenosis 03/24/2011   Granulomatosis with polyangiitis ([place]) 2009  Orientation RESPIRATION BLADDER Height & Weight     Self, Situation, Place  O2 ([place] 2) Continent, Indwelling catheter Weight:  231 lb 14.8 oz (105.2 kg) Height:  5\' 5"  (165.1 cm)  BEHAVIORAL SYMPTOMS/MOOD NEUROLOGICAL BOWEL NUTRITION STATUS      Incontinent (Rectal tube (temporary)) Diet (See DC summary)  AMBULATORY STATUS COMMUNICATION OF NEEDS Skin   Total Care Verbally Skin abrasions (R anterior foot wound at toe amputation site)                       Personal Care Assistance Level of Assistance  Bathing, Feeding, Dressing Bathing Assistance: Maximum assistance Feeding assistance: Maximum assistance Dressing Assistance: Maximum assistance     Functional Limitations Info  Sight, Hearing, Speech Sight Info: Adequate Hearing Info: Adequate Speech Info: Adequate    SPECIAL CARE FACTORS FREQUENCY  PT (By licensed PT), OT (By licensed OT)     PT Frequency: 5x week OT Frequency: 5x week            Contractures Contractures Info: Not present    Additional Factors Info  Code Status, Allergies, Psychotropic, Insulin Sliding Scale Code Status Info: Full Allergies Info: Ibuprofen, Penicillins Psychotropic Info: Duloxetine Insulin Sliding Scale Info: Insulin Aspart (Novolog) 0-9 U 3x daily w/ meals,       Current Medications (04/28/2022):  This is the current hospital active medication list Current Facility-Administered Medications  Medication Dose Route Frequency Provider Last Rate Last Admin   0.9 %  sodium chloride infusion (Manually program via Guardrails IV Fluids)   Intravenous Once 06/29/2022, DO       acetaminophen (TYLENOL) tablet 650 mg  650 mg Oral Q6H PRN Tat, Noralee Stain, MD   650 mg at 04/26/22 2130   albuterol (PROVENTIL) (2.5 MG/3ML) 0.083% nebulizer solution 2.5 mg  2.5 mg Nebulization BID PRN 2131, MD       alteplase (CATHFLO ACTIVASE) injection 2 mg  2 mg Intracatheter Once PRN Azucena Fallen, MD       anticoagulant sodium citrate solution 5 mL  5 mL Dialysis PRN Zetta Bills, MD       Chlorhexidine Gluconate Cloth 2 % PADS 6 each  6 each Topical Daily Tat, Zetta Bills, MD   6  each at 04/28/22 0924   DULoxetine (CYMBALTA) DR capsule 30 mg  30 mg Oral Daily 06/29/22, MD   30 mg at 04/28/22 06/29/22   folic acid (FOLVITE) tablet 2 mg  2 mg Oral Daily Tat, David, MD   2 mg at 04/28/22 0923   guaiFENesin-dextromethorphan (ROBITUSSIN DM) 100-10 MG/5ML syrup 10 mL  10 mL Oral 06/29/22, MD   10 mL at 04/28/22 0923   heparin injection 1,000 Units  1,000 Units Intracatheter PRN 06/29/22, MD   1,000 Units at 04/26/22 1919   heparin injection 2,000 Units  2,000 Units Dialysis PRN 06/27/22, MD       insulin aspart (novoLOG) injection 0-9 Units  0-9 Units Subcutaneous TID Zetta Bills Derek Jack, MD   1 Units at 04/28/22 1252   insulin glargine-yfgn (SEMGLEE) injection 4 Units  4 Units Subcutaneous QHS 06/29/22, MD   4 Units at 04/27/22 2126   lidocaine (LIDODERM) 5 % 1 patch  1 patch Transdermal Daily PRN 2127, MD   1 patch at 04/17/22 2123   linaclotide (LINZESS) capsule 145 mcg  145 mcg Oral QAC breakfast 2124, MD   145 mcg at 04/28/22 [phone]  lip balm (CARMEX) ointment 1 Application  1 Application Topical PRN Azucena Fallen, MD       loperamide HCl (IMODIUM) 1 MG/7.5ML suspension 2 mg  2 mg Oral PRN Azucena Fallen, MD   2 mg at 04/26/22 1147   methocarbamol (ROBAXIN) tablet 500 mg  500 mg Oral Q8H PRN Tat, Onalee Hua, MD   500 mg at 04/26/22 2136   ondansetron (ZOFRAN) tablet 4 mg  4 mg Oral Q6H PRN Tat, Onalee Hua, MD       Or   ondansetron (ZOFRAN) injection 4 mg  4 mg Intravenous Q6H PRN Tat, Onalee Hua, MD       pantoprazole (PROTONIX) EC tablet 40 mg  40 mg Oral Daily Tat, David, MD   40 mg at 04/28/22 1700   predniSONE (DELTASONE) tablet 15 mg  15 mg Oral Q supper Noralee Stain, DO   15 mg at 04/27/22 1700   predniSONE (DELTASONE) tablet 20 mg  20 mg Oral Q breakfast Noralee Stain, DO   20 mg at 04/28/22 1749   senna (SENOKOT) tablet 17.2 mg  2 tablet Oral BID Catarina Hartshorn, MD   17.2 mg at 04/27/22 4496   vitamin B-12 (CYANOCOBALAMIN) tablet 1,000 mcg   1,000 mcg Oral Daily Catarina Hartshorn, MD   1,000 mcg at 04/28/22 7591     Discharge Medications: Please see discharge summary for a list of discharge medications.  Relevant Imaging Results:  Relevant Lab Results:   Additional Information SS# [id number], [address]

## 2022-04-28 NOTE — Progress Notes (Signed)
Laboratory called for critical hemoglobin result of 5.6. Minimal bright red bleeding saw in patient rectal area, after cleaning no more bleeding is coming out. Paged on call MD.

## 2022-04-29 DIAGNOSIS — G9341 Metabolic encephalopathy: Secondary | ICD-10-CM | POA: Diagnosis not present

## 2022-04-29 LAB — GLUCOSE, CAPILLARY
Glucose-Capillary: 120 mg/dL — ABNORMAL HIGH (ref 70–99)
Glucose-Capillary: 147 mg/dL — ABNORMAL HIGH (ref 70–99)
Glucose-Capillary: 182 mg/dL — ABNORMAL HIGH (ref 70–99)
Glucose-Capillary: 173 mg/dL — ABNORMAL HIGH (ref 70–99)

## 2022-04-29 LAB — RENAL FUNCTION PANEL
Albumin: 1.7 g/dL — ABNORMAL LOW (ref 3.5–5.0)
Anion gap: 11 (ref 5–15)
BUN: 36 mg/dL — ABNORMAL HIGH (ref 6–20)
CO2: 27 mmol/L (ref 22–32)
Calcium: 7.4 mg/dL — ABNORMAL LOW (ref 8.9–10.3)
Chloride: 98 mmol/L (ref 98–111)
Creatinine, Ser: 3.11 mg/dL — ABNORMAL HIGH (ref 0.44–1.00)
GFR, Estimated: 17 mL/min — ABNORMAL LOW (ref >60)
Glucose, Bld: 136 mg/dL — ABNORMAL HIGH (ref 70–99)
Phosphorus: 4.9 mg/dL — ABNORMAL HIGH (ref 2.5–4.6)
Potassium: 3.7 mmol/L (ref 3.5–5.1)
Sodium: 136 mmol/L (ref 135–145)

## 2022-04-29 LAB — IRON AND TIBC
Iron: 26 ug/dL — ABNORMAL LOW (ref 28–170)
Saturation Ratios: 12 % (ref 10.4–31.8)
TIBC: 213 ug/dL — ABNORMAL LOW (ref 250–450)
UIBC: 187 ug/dL

## 2022-04-29 LAB — CBC WITH DIFFERENTIAL/PLATELET
Abs Immature Granulocytes: 0 10*3/uL (ref 0.00–0.07)
Basophils Absolute: 0 10*3/uL (ref 0.0–0.1)
Basophils Relative: 0 %
Eosinophils Absolute: 0 10*3/uL (ref 0.0–0.5)
Eosinophils Relative: 0 %
HCT: 17.6 % — ABNORMAL LOW (ref 36.0–46.0)
Hemoglobin: 6 g/dL — CL (ref 12.0–15.0)
Lymphocytes Relative: 8 %
Lymphs Abs: 1.8 10*3/uL (ref 0.7–4.0)
MCH: 28.8 pg (ref 26.0–34.0)
MCHC: 34.1 g/dL (ref 30.0–36.0)
MCV: 84.6 fL (ref 80.0–100.0)
Monocytes Absolute: 1.3 10*3/uL — ABNORMAL HIGH (ref 0.1–1.0)
Monocytes Relative: 6 %
Neutro Abs: 19 10*3/uL — ABNORMAL HIGH (ref 1.7–7.7)
Neutrophils Relative %: 86 %
Platelets: 163 10*3/uL (ref 150–400)
RBC: 2.08 MIL/uL — ABNORMAL LOW (ref 3.87–5.11)
RDW: 16.5 % — ABNORMAL HIGH (ref 11.5–15.5)
WBC: 22.1 10*3/uL — ABNORMAL HIGH (ref 4.0–10.5)
nRBC: 0 /100 WBC
nRBC: 0.5 % — ABNORMAL HIGH (ref 0.0–0.2)

## 2022-04-29 LAB — HAPTOGLOBIN: Haptoglobin: 356 mg/dL — ABNORMAL HIGH (ref 33–346)

## 2022-04-29 LAB — OCCULT BLOOD X 1 CARD TO LAB, STOOL: Fecal Occult Bld: NEGATIVE

## 2022-04-29 LAB — PREPARE RBC (CROSSMATCH)

## 2022-04-29 MED ORDER — MELATONIN 3 MG PO TABS
3.0000 mg | ORAL_TABLET | Freq: Once | ORAL | Status: AC | PRN
  Administered 2022-04-29: 3 mg via ORAL
  Filled 2022-04-29: qty 1

## 2022-04-29 MED ORDER — MELATONIN 3 MG PO TABS
3.0000 mg | ORAL_TABLET | Freq: Every evening | ORAL | Status: AC | PRN
  Administered 2022-04-29: 3 mg via ORAL
  Filled 2022-04-29: qty 1

## 2022-04-29 MED ORDER — SODIUM CHLORIDE 0.9% IV SOLUTION: Freq: Once | INTRAVENOUS | Status: DC

## 2022-04-29 MED ORDER — GERHARDT'S BUTT CREAM
TOPICAL_CREAM | Freq: Every day | CUTANEOUS | Status: DC | PRN
Start: 2022-04-29 — End: 2022-05-18
  Administered 2022-04-30: 1 via TOPICAL
  Filled 2022-04-29: qty 1

## 2022-04-29 NOTE — Progress Notes (Signed)
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   Patient presents with worsening intermittent altered mental status and increased weakness.  She has been declining since approximately January, 6 months ago per husband.  She was recently admitted to our facility and treated for pneumonia with moderate improvement.  She was to continue on vancomycin through 04/08/2022 followed by infectious disease.  Bronchoscopy during that admission on 03/30/2022 shows Candida in the setting of ongoing antibiotics and urine culture ultimately grew yeast - deemed to be colonized and not active infection given fungitel was reassuringly low per ID discussion. Of note she is also had a recent diagnosis of pulmonary embolism on apixaban.   Patient admitted this hospitalization (on 04/17/22) with elevated white count, lactic acid, and acute renal failure in the setting of poor p.o. intake altered mental status tachycardia and tachypnea concerning for sepsis. Patient's infectious source was presumed to be pneumonia versus UTI - placed initially on broad-spectrum antibiotics of vancomycin and cefepime. Her sepsis criteria did not improve and her renal function continued to worsen in the setting of presumed sepsis and rhabdomyolysis (ATN). Nephrology following with worsening mental status, tremors/muscle spasms with metabolic derangements. Patient transferred to main campus for neurology evaluation, EEG ultimately unremarkable for seizures-her movement was considered to be musculoskeletal, somewhat choreiform in nature, holding off on LP per neuro given improvements with HD and intrathecal pump turned down (unable to be turned  off but [facility]  Assessment & Plan:   Principal Problem:   Acute metabolic encephalopathy Active Problems:   Acute cystitis without hematuria   Current chronic use of systemic steroids   Chronic pain   Secondary adrenal insufficiency (HCC)   AKI (acute kidney injury) (HCC)   Granulomatosis with polyangiitis (HCC)   GERD (gastroesophageal reflux disease)   Essential hypertension   Obesity (BMI 30-39.9)   COVID-19 virus infection   Severe sepsis (HCC)   Pulmonary embolism (HCC)   Transaminitis   Rhabdomyolysis  Acute metabolic encephalopathy, POA, multifactorial - Initially presumed to be secondary to sepsis/renal failure/hyponatremia  - Potential covid encephalitis, however spastic choreiform movements are not consistent with COVID encephalitis - MRI unremarkable for acute process  - EEG unremarkable for seizure - Discontinued all home CNS depressant medications - continued Cymbalta only - Mental status improving after initiating dialysis and discontinuation of intrathecal pain pump - Discussed with neurology.  Holding off on LP and CTA head at this time with improvement.  Neurology signed off 7/6   Acute blood loss anemia on anemia of chronic kidney disease, history of chronic iron deficiency anemia: Noted some bleeding from her dialysis catheter as well as minimal rectal bleeding with history of hemorrhoids and fissure - Transfused 2 unit packed red blood cell 7/6, 1 unit 7/7 and hemoglobin dropped less than 7 today and 1 unit of PRBC is ordered for transfusion again today. - Followed by [facility] with history of chronic normocytic anemia as well as iron deficiency anemia with hemoglobin ranging from 8.9-13.6.  Has been getting outpatient iron infusion biweekly. - Followed by [facility].  Had normal colonoscopy 2014, flex sigmoidoscopy 2020 for rectal bleeding which revealed deep anal fissure and external hemorrhoids.  Underwent Botox, then lateral internal anal  sphincterotomy.  EGD November 2022 was normal.  Colonoscopy revealed multiple diverticula, internal hemorrhoids, 3 polyps. Suspect this is likely due to either hemorrhoids/fissure/diverticula in setting of chronic iron deficiency and anemia of chronic disease. BP remains stable.  We will check iron studies, FOBT.  If FOBT positive, we will engage GI.  No signs of retroperitoneal hemorrhage.   Polypharmacy risk - Home medications include bupivacaine/zinconotide intrathecal pump, duloxetine, gabapentin, methocarbamol, nucynta, oxycodone, Topamax.   Chronic pain, neuropathy, and chronic opioid dependence  - Patient has intrathecal pump with bupivacaine and ziconotide - turned down to lowest setting 7/3 (cannot stop completely per tech) - Consider restarting low dose PO oxy vs nucynta    AKI/ATN currently requiring HD - Complicated and multifactorial etiology, possibly ATN per nephrology - Temp HD catheter initially placed 04/24/2022 - issues with bleeding, but no more.  Last hemodialysis 04/28/2022.  Nephrology plans to watch over the weekend.  Creatinine is improving.   Granulomatosis with polyangiitis  - Patient receiving rituximab and methotrexate as an outpatient (most recently at [facility] in May 2023) - confirmed with rheumatology at [facility] (Dr. Dimple Casey) - Nephrology sent off multiple biomarkers: *Of note C4, and Kappa light chains* were elevated minimally but otherwise ANCA, ANA, RF, anti-dsDNA, c3, spep/sflc without abnormalities, hepatitis panel, HIV, EBV, CMV were unremarkable - Cryos still pending  - Continue prednisone 20mg  qAM and 15mg  qPM - continue to wean to baseline level (5mg  daily)    Secondary adrenal insufficiency  -Continue steroids    Current chronic use of systemic steroids -On 5 mg daily at baseline.  Continue to wean   Acute hypoxic respiratory failure, POA -Multifactorial in the setting of metabolic encephalopathy, presumed pneumonia, granulomatosis/polyangiitis,  concurrent COVID-19 infection -Potentially exacerbated by hypervolemia surrounding renal failure -Resolved and remains on room air   Presumed severe sepsis secondary to pneumonia versus UTI -Completed 5 days of antibiotic therapy   C. difficile colonization -C. difficile toxin is negative.  No indication of treatment at this time   GERD  -PPI    Rhabdomyolysis -Secondary to prolonged downtime -Improved   Elevated transaminitis -Most likely in the setting of sepsis/shock liver and rhabdomyolysis. -Improved   History of pulmonary embolism  -Lovenox on hold due to recurrent anemia requiring blood transfusion.   COVID-19 virus infection, questionably incidental - COVID PCR incidentally positive at time of admission, without respiratory symptoms - Remdesivir withheld due to elevated liver enzymes - Completed 10-day quarantine   Diabetes mellitus -A1c 8.6 -Blood sugar controlled, continue current dose of Semglee, sliding scale insulin   Obesity  Estimated body mass index is 38.37 kg/m as calculated from the following:   Height as of this encounter: 5\' 5"  (1.651 m).   Weight as of this encounter: 104.6 kg.  DVT prophylaxis: Place and maintain sequential compression device Start: 04/28/22 1251   Code Status: Full Code  Family Communication: Son present at bedside.  Plan of care discussed with patient in length and he/she verbalized understanding and agreed with it.  Status is: Inpatient Remains inpatient appropriate because: Recurrent bleeding requiring blood transfusion.   Estimated body mass index is 36.94 kg/m as calculated from the following:   Height as of this encounter: 5\' 5"  (1.651 m).   Weight as of this encounter: 100.7 kg.    Nutritional Assessment: Body mass index is 36.94 kg/m. Seen by dietician.  I agree with the assessment and plan as outlined below: Nutrition Status:        . Skin Assessment: I have  examined the patient's skin and I agree with  the wound assessment as performed by the wound care RN as outlined below:    Consultants:  Nephrology Neurology-signed off  Procedures:  As above  Antimicrobials:  Anti-infectives (From admission, onward)    Start     Dose/Rate Route Frequency Ordered Stop   04/21/22 2200  meropenem (MERREM) 500 mg in sodium chloride 0.9 % 100 mL IVPB  Status:  Discontinued        500 mg 200 mL/hr over 30 Minutes Intravenous Every 12 hours 04/21/22 1507 04/22/22 1945   04/21/22 1000  fluconazole (DIFLUCAN) tablet 100 mg  Status:  Discontinued        100 mg Oral Daily 04/20/22 0952 04/22/22 1945   04/20/22 1500  meropenem (MERREM) 1 g in sodium chloride 0.9 % 100 mL IVPB  Status:  Discontinued        1 g 200 mL/hr over 30 Minutes Intravenous Every 12 hours 04/20/22 1408 04/21/22 1507   04/20/22 1045  fluconazole (DIFLUCAN) tablet 200 mg        200 mg Oral  Once 04/20/22 0952 04/20/22 1207   04/20/22 1021  ceFEPIme (MAXIPIME) 2 g in sodium chloride 0.9 % 100 mL IVPB  Status:  Discontinued        2 g 200 mL/hr over 30 Minutes Intravenous Every 24 hours 04/19/22 1355 04/20/22 1334   04/19/22 1100  linezolid (ZYVOX) IVPB 600 mg  Status:  Discontinued        600 mg 300 mL/hr over 60 Minutes Intravenous Every 12 hours 04/19/22 1014 04/22/22 1945   04/18/22 1200  vancomycin (VANCOREADY) IVPB 750 mg/150 mL  Status:  Discontinued        750 mg 150 mL/hr over 60 Minutes Intravenous Every 24 hours 04/17/22 1306 04/19/22 1014   04/17/22 2200  ceFEPIme (MAXIPIME) 2 g in sodium chloride 0.9 % 100 mL IVPB  Status:  Discontinued        2 g 200 mL/hr over 30 Minutes Intravenous Every 12 hours 04/17/22 1306 04/19/22 1355   04/17/22 1300  vancomycin (VANCOREADY) IVPB 2000 mg/400 mL        2,000 mg 200 mL/hr over 120 Minutes Intravenous  Once 04/17/22 1208 04/17/22 1729   04/17/22 1200  ceFEPIme (MAXIPIME) 2 g in sodium chloride 0.9 % 100 mL IVPB        2 g 200 mL/hr over 30 Minutes Intravenous  Once 04/17/22  1153 04/17/22 1330   04/17/22 1200  metroNIDAZOLE (FLAGYL) IVPB 500 mg        500 mg 100 mL/hr over 60 Minutes Intravenous  Once 04/17/22 1153 04/17/22 1435   04/17/22 1200  vancomycin (VANCOCIN) IVPB 1000 mg/200 mL premix  Status:  Discontinued        1,000 mg 200 mL/hr over 60 Minutes Intravenous  Once 04/17/22 1153 04/17/22 1208         Subjective: Patient seen and examined.  She states that I do not feel well because " I feel I am in the clouds and I do not know how to get out of it".  Although when asked, she is able to answer all the questions appropriately regarding orientation.  She has no specific complaint.  Objective: Vitals:   04/29/22 0638 04/29/22 0731 04/29/22 1000 04/29/22 1124  BP: 129/89  (!) 138/94   Pulse: 98  96   Resp: 20  19 (!) 22  Temp: 99.1 F (37.3 C) 99 F (37.2 C)  98 F (36.7 C) 98.6 F (37 C)  TempSrc: Oral Oral Oral Oral  SpO2: 99%   98%  Weight:      Height:        Intake/Output Summary (Last 24 hours) at 04/29/2022 1328 Last data filed at 04/29/2022 1000 Gross per 24 hour  Intake 365 ml  Output 350 ml  Net 15 ml   Filed Weights   04/26/22 1402 04/26/22 1914 04/28/22 1645  Weight: 105.2 kg 105.2 kg 100.7 kg    Examination:  General exam: Appears calm and comfortable  Respiratory system: Clear to auscultation. Respiratory effort normal. Cardiovascular system: S1 & S2 heard, RRR. No JVD, murmurs, rubs, gallops or clicks. No pedal edema. Gastrointestinal system: Abdomen is nondistended, soft and nontender. No organomegaly or masses felt. Normal bowel sounds heard. Central nervous system: Alert and oriented. No focal neurological deficits. Extremities: Bilateral forefoot amputation. Skin: No rashes, lesions or ulcers Psychiatry: Judgement and insight appear normal. Mood & affect appropriate.    Data Reviewed: I have personally reviewed following labs and imaging studies  CBC: Recent Labs  Lab 04/26/22 0350 04/27/22 0215  04/27/22 1130 04/27/22 2100 04/28/22 0400 04/28/22 0520 04/28/22 1750 04/29/22 0455  WBC 12.5* 14.8*  --   --  22.9* 19.5*  --  22.1*  NEUTROABS  --   --   --   --   --   --   --  19.0*  HGB 7.5* 5.8*   < > 7.2* 5.6* 6.3* 7.5* 6.0*  HCT 23.6* 18.7*   < > 21.1* 16.5* 18.6* 21.5* 17.6*  MCV 81.9 84.2  --   --  84.2 83.4  --  84.6  PLT 203 141*  --   --  114* 106*  --  163   < > = values in this interval not displayed.   Basic Metabolic Panel: Recent Labs  Lab 04/25/22 0022 04/26/22 0350 04/27/22 0215 04/28/22 0400 04/29/22 0455  NA 134* 136 135 138 136  K 4.0 4.0 4.0 3.8 3.7  CL 94* 94* 100 97* 98  CO2 24 24 27 25 27   GLUCOSE 100* 84 149* 160* 136*  BUN 62* 64* 34* 49* 36*  CREATININE 5.20* 5.45* 3.53* 4.30* 3.11*  CALCIUM 7.0* 6.9* 7.0* 7.4* 7.4*  PHOS 6.5* 7.4* 5.4* 6.2* 4.9*   GFR: Estimated Creatinine Clearance: 23.8 mL/min (A) (by C-G formula based on SCr of 3.11 mg/dL (H)). Liver Function Tests: Recent Labs  Lab 04/22/22 2031 04/23/22 1015 04/25/22 0022 04/26/22 0350 04/27/22 0215 04/28/22 0400 04/29/22 0455  AST 122*  --  95* 72* 54* 52*  --   ALT 120*  --  77* 54* 40 34  --   ALKPHOS 73  --  67 56 54 44  --   BILITOT 1.0  --  0.7 1.1 1.0 1.1  --   PROT 4.6*  --  5.0* 4.4* 4.1* 4.1*  --   ALBUMIN 1.8*   < > 1.9*  1.9* 1.8*  1.8* 1.6*  1.6* 1.7*  1.7* 1.7*   < > = values in this interval not displayed.   No results for input(s): "LIPASE", "AMYLASE" in the last 168 hours. No results for input(s): "AMMONIA" in the last 168 hours. Coagulation Profile: No results for input(s): "INR", "PROTIME" in the last 168 hours. Cardiac Enzymes: Recent Labs  Lab 04/24/22 0816 04/25/22 0022 04/26/22 0350 04/27/22 0215 04/28/22 0400  CKTOTAL 4,049* 3,558* 2,171* 1,035* 1,772*   BNP (last 3 results) No results  for input(s): "PROBNP" in the last 8760 hours. HbA1C: No results for input(s): "HGBA1C" in the last 72 hours. CBG: Recent Labs  Lab 04/28/22 0947  04/28/22 1258 04/28/22 2224 04/29/22 0628 04/29/22 1123  GLUCAP 139* 155* 134* 120* 147*   Lipid Profile: No results for input(s): "CHOL", "HDL", "LDLCALC", "TRIG", "CHOLHDL", "LDLDIRECT" in the last 72 hours. Thyroid Function Tests: No results for input(s): "TSH", "T4TOTAL", "FREET4", "T3FREE", "THYROIDAB" in the last 72 hours. Anemia Panel: Recent Labs    04/28/22 1232  RETICCTPCT 1.7   Sepsis Labs: No results for input(s): "PROCALCITON", "LATICACIDVEN" in the last 168 hours.  Recent Results (from the past 240 hour(s))  Gastrointestinal Panel by PCR , Stool     Status: None   Collection Time: 04/23/22  3:55 PM   Specimen: Stool  Result Value Ref Range Status   Campylobacter species NOT DETECTED NOT DETECTED Final   Plesimonas shigelloides NOT DETECTED NOT DETECTED Final   Salmonella species NOT DETECTED NOT DETECTED Final   Yersinia enterocolitica NOT DETECTED NOT DETECTED Final   Vibrio species NOT DETECTED NOT DETECTED Final   Vibrio cholerae NOT DETECTED NOT DETECTED Final   Enteroaggregative E coli (EAEC) NOT DETECTED NOT DETECTED Final   Enteropathogenic E coli (EPEC) NOT DETECTED NOT DETECTED Final   Enterotoxigenic E coli (ETEC) NOT DETECTED NOT DETECTED Final   Shiga like toxin producing E coli (STEC) NOT DETECTED NOT DETECTED Final   Shigella/Enteroinvasive E coli (EIEC) NOT DETECTED NOT DETECTED Final   Cryptosporidium NOT DETECTED NOT DETECTED Final   Cyclospora cayetanensis NOT DETECTED NOT DETECTED Final   Entamoeba histolytica NOT DETECTED NOT DETECTED Final   Giardia lamblia NOT DETECTED NOT DETECTED Final   Adenovirus F40/41 NOT DETECTED NOT DETECTED Final   Astrovirus NOT DETECTED NOT DETECTED Final   Norovirus GI/GII NOT DETECTED NOT DETECTED Final   Rotavirus A NOT DETECTED NOT DETECTED Final   Sapovirus (I, II, IV, and V) NOT DETECTED NOT DETECTED Final    Comment: Performed at [facility], [address] [place], [place] [address]  C  Difficile Quick Screen (NO PCR Reflex)     Status: Abnormal   Collection Time: 04/23/22  3:57 PM   Specimen: Stool  Result Value Ref Range Status   C Diff antigen POSITIVE (A) NEGATIVE Final   C Diff toxin NEGATIVE NEGATIVE Final   C Diff interpretation   Final    Results are indeterminate. Please contact the provider listed for your campus for C diff questions in AMION.    Comment: Performed at [facility], [address], [place], [place] [address]     Radiology Studies: No results found.  Scheduled Meds:  sodium chloride   Intravenous Once   sodium chloride   Intravenous Once   Chlorhexidine Gluconate Cloth  6 each Topical Daily   DULoxetine  30 mg Oral Daily   folic acid  2 mg Oral Daily   guaiFENesin-dextromethorphan  10 mL Oral Q12H   insulin aspart  0-9 Units Subcutaneous TID WC   insulin glargine-yfgn  4 Units Subcutaneous QHS   linaclotide  145 mcg Oral QAC breakfast   pantoprazole  40 mg Oral Daily   predniSONE  15 mg Oral Q supper   predniSONE  20 mg Oral Q breakfast   senna  2 tablet Oral BID   vitamin B-12  1,000 mcg Oral Daily   Continuous Infusions:   LOS: 12 days   Hughie Closs, MD [facility]  04/29/2022, 1:28 PM   *Please note that this is a verbal dictation therefore any spelling or grammatical errors are due to the "[employer]agon [employer] One" system interpretation.  Please page via Amion and do not message via secure chat for urgent patient care matters. Secure chat can be used for non urgent patient care matters.  How to contact the [facility] Attending or Consulting provider 7A - 7P or covering provider during after hours 7P -7A, for this patient?  Check the care team in [facility] and look for a) attending/consulting [facility] provider listed and b) the [facility] team listed. Page or secure chat 7A-7P. Log into [email or web address] and use [place]'s universal password to access. If you do not have the password, please contact the hospital operator. Locate the [facility] provider  you are looking for under [facility] and page to a number that you can be directly reached. If you still have difficulty reaching the provider, please page the [facility] (Director on Call) for the Hospitalists listed on amion for assistance.

## 2022-04-29 NOTE — Progress Notes (Signed)
Patient ID: [name], female   DOB: 1966-02-09, 56 y.o.   MRN: [id number] [facility] Progress Note   Assessment/ Plan:   1. Acute kidney Injury: Nonoliguric overnight with only slight worsening of renal function.  Etiology appears unclear at this time but not suspected to be related to her history of ANCA associated GPA based on recent rituximab administration in May and urinalysis not suggestive of GN.  She underwent hemodialysis yesterday for management of azotemia and the plan is to monitor her through the weekend for potential renal recovery while assessing urine output and labs.  2.  Anemia: Previously with some bleeding around her dialysis catheter and some transient hematochezia.  No overt loss noted to explain recent hemoglobin/hematocrit drop for which she is getting PRBC transfusion this morning.  May need to image abdomen to confirm that she does not have a retroperitoneal/intra-abdominal bleed. 3.  Altered mental status: Suspected to be multifactorial etiology with lumbar puncture deferred after improvement of mental status with dialysis and reduction of baclofen rate. 4.  Sepsis: Suspected to be of pneumonia/urinary tract infection etiology and previously on antibiotics that are currently on hold. 5.  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  Awaiting transfer to tertiary center for specialized care by rheumatology. 6.  COVID-19 virus infection: Incidental positive COVID PCR found at admission.  On corticosteroids primarily as a continuum of her preceding corticosteroid use.  Did not get remdesivir due to elevated LFTs; now off respiratory isolation.  Subjective:   She reports to have been very anxious/nervous during dialysis yesterday with some mild intradialytic hypotension in spite of no ultrafiltration.   Objective:   BP 129/89   Pulse 98   Temp 99 F (37.2 C) (Oral)    Resp 20   Ht $R'5\' 5"'aR$  (1.651 m)   Wt 100.7 kg   SpO2 99%   BMI 36.94 kg/m   Intake/Output Summary (Last 24 hours) at 04/29/2022 0900 Last data filed at 04/29/2022 0026 Gross per 24 hour  Intake 2838.75 ml  Output 350 ml  Net 2488.75 ml   Weight change:   Physical Exam: Gen: Appears comfortable resting in bed, nurse at bedside providing care CVS: Pulse regular rhythm, normal rate, S1 and S2 normal Resp: Transmitted breath sounds bilaterally without rales/rhonchi Abd: Soft, obese, nontender, bowel sounds normal Ext: Trace lower extremity edema.  Bilateral forefoot amputations noted  Imaging: DG Swallowing Func-Speech Pathology  Result Date: 04/27/2022 Table formatting from the original result was not included. Objective Swallowing Evaluation: Type of Study: MBS-Modified Barium Swallow Study  Patient Details Name: [name] MRN: [id number] Date of Birth: [birth year] Today's Date: 04/27/2022 Time: SLP Start Time (ACUTE ONLY): 75 -SLP Stop Time (ACUTE ONLY): 1250 SLP Time Calculation (min) (ACUTE ONLY): 20 min Past Medical History: Past Medical History: Diagnosis Date  Chronic cough   Chronic pain   Diabetes mellitus without complication (HCC)   Dyspnea   GERD (gastroesophageal reflux disease)   Hypokalemia   Hypothyroidism   Left wrist pain 06/01/2021  Myonecrosis ([place]) 07/01/2021  Neurocardiogenic syncope   OSA (obstructive sleep apnea)   does not use CPAP  Osteomyelitis (HCC)   bilateral feet  Peripheral neuropathy   Presence of intrathecal pump   recieves Prialt/bupivicaine  Tear of gluteus medius tendon 07/01/2021  Wegener's granulomatosis 2009  Wegner's disease (congenital syphilitic osteochondritis)  Past Surgical History: Past Surgical History: Procedure Laterality Date  AMPUTATION Right  10/21/2020  Procedure: Right hallux amputation;  Surgeon: Wylene Simmer, MD;  Location: [place];  Service: Orthopedics;  Laterality: Right;  AMPUTATION TOE Bilateral 08/12/2020  Procedure: Right  3rd toe amputation; left hallux and 3rd toe amputation;  Surgeon: Wylene Simmer, MD;  Location: [place];  Service: Orthopedics;  Laterality: Bilateral;  3min  AMPUTATION TOE Right 03/24/2021  Procedure: AMPUTATION TOE;  Surgeon: Wylene Simmer, MD;  Location: [place];  Service: Orthopedics;  Laterality: Right;  APPENDECTOMY    BACK SURGERY    BIOPSY  03/30/2022  Procedure: BIOPSY;  Surgeon: Laurin Coder, MD;  Location: [place];  Service: Pulmonary;;  BRONCHIAL WASHINGS  03/30/2022  Procedure: BRONCHIAL WASHINGS;  Surgeon: Laurin Coder, MD;  Location: [place];  Service: Pulmonary;;  CHOLECYSTECTOMY    COLONOSCOPY N/A 06/02/2013  Procedure: COLONOSCOPY;  Surgeon: Wonda Horner, MD;  Location: [facility];  Service: Endoscopy;  Laterality: N/A;  HERNIA REPAIR  2000  INCISION AND DRAINAGE ABSCESS Left 07/07/2016  Procedure: DEBRIDMENT LEFT THIGH ABSCESS, EXISION ACUTE SKIN RASH LEFT THIGH(1CM LESION);  Surgeon: Fanny Skates, MD;  Location: [facility];  Service: General;  Laterality: Left;  IR FLUORO GUIDE CV LINE RIGHT  04/24/2022  IR US GUIDE VASC ACCESS RIGHT  04/24/2022  SEPTOPLASTY  02/2013  spleen anuyism    splenic aneurysm    TRANSMETATARSAL AMPUTATION Bilateral 10/21/2020  Procedure: Left transmetatarsal amputation;  Surgeon: Wylene Simmer, MD;  Location: [place];  Service: Orthopedics;  Laterality: Bilateral;  TRANSMETATARSAL AMPUTATION Left 03/24/2021  Procedure: TRANSMETATARSAL AMPUTATION;  Surgeon: Wylene Simmer, MD;  Location: [place];  Service: Orthopedics;  Laterality: Left;  VAGINAL HYSTERECTOMY    VIDEO BRONCHOSCOPY Right 03/30/2022  Procedure: VIDEO BRONCHOSCOPY WITH FLUORO;  Surgeon: Laurin Coder, MD;  Location: [place] [facility];  Service: Pulmonary;  Laterality: Right;  atypical pneumonia HPI: 56 y.o. female with medical history significant of Wegener's angiitis, chronic opioid dependency, hyperlipidemia, hypertension, impaired glucose tolerance, adrenal  insufficiency on chronic prednisone usage, rheumatoid arthritis and multiple admissions secondary to sepsis in the setting of pneumonia and UTI.  Presented to the hospital [name]) on 04/17/22 with complaints of intermittent altered mental status changes at home and increased weakness.  Symptoms have been present for the last 2-3 days and worsening according to patient's husband.  Patient has just completed her antibiotic therapy from most recent admission about 1 week prior to presentation.  In the ED patient met criteria for severe sepsis with elevated WBCs, elevated lactic acid in the 4.7 range, acute renal failure as organ dysfunction and was also tachycardic and tachypneic;Subsequently found to be Covid+; transferred to [facility] on 04/22/22; ST evaluation on 04/21/22 indicating need for Dysphagia 2/honey-thickened liquids: CXR on 04/17/22 indicated Progressive interstitial pattern left lung and right upper lobe.  This may represent edema or infection; ST f/u for potential objective assessment/diet tolerance.  Subjective: pleasant, cooperative  Recommendations for follow up therapy are one component of a multi-disciplinary discharge planning process, led by the attending physician.  Recommendations may be updated based on patient status, additional functional criteria and insurance authorization. Assessment / Plan / Recommendation   04/27/2022   1:24 PM Clinical Impressions Clinical Impression Patient presents with a mild oropharyngeal dysphagia as per this MBS. During oral phase, the only difficulty she had was mastication of regular solids and mildly delayed oral transit of bolus. She was not able to maintain bolus with 45mm barium tablet when attempted with thin liquids and then  with puree solids and patient had to spit tablet out both times. No oral phase impairments observed with puree solids, thin liquids or nectar thick liquids. During pharyngeal phase, she exhibited swallow initiation to level of  vallecular sinus with regular solids puree solids, nectar thick liquids and swallow initiation delay to level of pyriform sinus with thin liquids. She exhibited silent aspiration during the swallow (PAS 8) with thin liquids of trace amount. Aspiration did not occur with each swallow of thin liquids, however deep penetration did occur with majority of swallows of thin liquids. No significant pharyngeal residuals observed post swallows with any of the tested consistencies. Cervical esophageal phase appeared [facility]. SLP Visit Diagnosis Dysphagia, oropharyngeal phase (R13.12) Impact on safety and function Mild aspiration risk;Moderate aspiration risk     04/27/2022   1:24 PM Treatment Recommendations Treatment Recommendations Therapy as outlined in treatment plan below     04/27/2022   1:30 PM Prognosis Prognosis for Safe Diet Advancement Good Barriers to Reach Goals Severity of deficits;Time post onset   04/27/2022   1:24 PM Diet Recommendations SLP Diet Recommendations Dysphagia 3 (Mech soft) solids;nectar thick liquid Liquid Administration via Straw;Cup Medication Administration Whole meds with puree Compensations Slow rate;Small sips/bites Postural Changes Seated upright at 90 degrees     04/27/2022   1:24 PM Other Recommendations Oral Care Recommendations Oral care BID Other Recommendations Order thickener from pharmacy;Remove water pitcher;Clarify dietary restrictions;Prohibited food (jello, ice cream, thin soups) Follow Up Recommendations Follow physician's recommendations for discharge plan and follow up therapies Assistance recommended at discharge Frequent or constant Supervision/Assistance   04/27/2022   1:24 PM Frequency and Duration  Speech Therapy Frequency (ACUTE ONLY) min 2x/week     04/27/2022   1:23 PM Oral Phase Oral Phase Impaired Oral - Regular Impaired mastication;Reduced posterior propulsion Oral - Pill Decreased bolus cohesion    04/27/2022   1:23 PM Pharyngeal Phase Pharyngeal Phase Impaired Pharyngeal- Nectar  Cup Delayed swallow initiation-vallecula Pharyngeal- Thin Cup Delayed swallow initiation-pyriform sinuses;Reduced airway/laryngeal closure;Penetration/Aspiration during swallow;Trace aspiration Pharyngeal Material enters airway, passes BELOW cords without attempt by patient to eject out (silent aspiration) Pharyngeal- Puree Delayed swallow initiation-vallecula Pharyngeal- Regular Delayed swallow initiation-vallecula Pharyngeal- Pill NT    04/27/2022   1:24 PM Cervical Esophageal Phase  Cervical Esophageal Phase Darryll Capers Sonia Baller, MA, CCC-SLP Speech Therapy                      Labs: BMET Recent Labs  Lab 04/23/22 1015 04/24/22 0816 04/25/22 0022 04/26/22 0350 04/27/22 0215 04/28/22 0400 04/29/22 0455  NA 126* 127* 134* 136 135 138 136  K 4.6 5.1 4.0 4.0 4.0 3.8 3.7  CL 89* 90* 94* 94* 100 97* 98  CO2 18* 17* $Remov'24 24 27 25 27  'yoOEgt$ GLUCOSE 131* 81 100* 84 149* 160* 136*  BUN 83* 101* 62* 64* 34* 49* 36*  CREATININE 7.59* 8.35* 5.20* 5.45* 3.53* 4.30* 3.11*  CALCIUM 6.5* 6.3* 7.0* 6.9* 7.0* 7.4* 7.4*  PHOS 9.3* 11.2* 6.5* 7.4* 5.4* 6.2* 4.9*   CBC Recent Labs  Lab 04/27/22 0215 04/27/22 1130 04/28/22 0400 04/28/22 0520 04/28/22 1750 04/29/22 0455  WBC 14.8*  --  22.9* 19.5*  --  22.1*  NEUTROABS  --   --   --   --   --  19.0*  HGB 5.8*   < > 5.6* 6.3* 7.5* 6.0*  HCT 18.7*   < > 16.5* 18.6* 21.5* 17.6*  MCV 84.2  --  84.2 83.4  --  84.6  PLT 141*  --  114* 106*  --  163   < > = values in this interval not displayed.    Medications:     sodium chloride   Intravenous Once   sodium chloride   Intravenous Once   Chlorhexidine Gluconate Cloth  6 each Topical Daily   DULoxetine  30 mg Oral Daily   folic acid  2 mg Oral Daily   guaiFENesin-dextromethorphan  10 mL Oral Q12H   insulin aspart  0-9 Units Subcutaneous TID WC   insulin glargine-yfgn  4 Units Subcutaneous QHS   linaclotide  145 mcg Oral QAC breakfast   pantoprazole  40 mg Oral Daily   predniSONE  15 mg Oral Q supper    predniSONE  20 mg Oral Q breakfast   senna  2 tablet Oral BID   vitamin B-12  1,000 mcg Oral Daily    Elmarie Shiley, MD 04/29/2022, 9:00 AM

## 2022-04-29 NOTE — Progress Notes (Addendum)
Laboratory called critical result of hemoglobin 6.0. No active bleeding seen. Paged on call MD via amion.  Addendum: on call provider ordered transfusion of 1 unit PRBC.

## 2022-04-30 ENCOUNTER — Inpatient Hospital Stay (HOSPITAL_COMMUNITY): Payer: BC Managed Care – PPO

## 2022-04-30 DIAGNOSIS — G9341 Metabolic encephalopathy: Secondary | ICD-10-CM | POA: Diagnosis not present

## 2022-04-30 LAB — GLUCOSE, CAPILLARY
Glucose-Capillary: 145 mg/dL — ABNORMAL HIGH (ref 70–99)
Glucose-Capillary: 147 mg/dL — ABNORMAL HIGH (ref 70–99)
Glucose-Capillary: 185 mg/dL — ABNORMAL HIGH (ref 70–99)

## 2022-04-30 LAB — RENAL FUNCTION PANEL
Albumin: 1.8 g/dL — ABNORMAL LOW (ref 3.5–5.0)
Anion gap: 16 — ABNORMAL HIGH (ref 5–15)
BUN: 49 mg/dL — ABNORMAL HIGH (ref 6–20)
CO2: 25 mmol/L (ref 22–32)
Calcium: 7.6 mg/dL — ABNORMAL LOW (ref 8.9–10.3)
Chloride: 96 mmol/L — ABNORMAL LOW (ref 98–111)
Creatinine, Ser: 3.69 mg/dL — ABNORMAL HIGH (ref 0.44–1.00)
GFR, Estimated: 14 mL/min — ABNORMAL LOW (ref >60)
Glucose, Bld: 162 mg/dL — ABNORMAL HIGH (ref 70–99)
Phosphorus: 5.8 mg/dL — ABNORMAL HIGH (ref 2.5–4.6)
Potassium: 4.3 mmol/L (ref 3.5–5.1)
Sodium: 137 mmol/L (ref 135–145)

## 2022-04-30 LAB — CBC WITH DIFFERENTIAL/PLATELET
Abs Immature Granulocytes: 0 10*3/uL (ref 0.00–0.07)
Basophils Absolute: 0 10*3/uL (ref 0.0–0.1)
Basophils Relative: 0 %
Eosinophils Absolute: 0 10*3/uL (ref 0.0–0.5)
Eosinophils Relative: 0 %
HCT: 19 % — ABNORMAL LOW (ref 36.0–46.0)
Hemoglobin: 6.6 g/dL — CL (ref 12.0–15.0)
Lymphocytes Relative: 4 %
Lymphs Abs: 1.1 10*3/uL (ref 0.7–4.0)
MCH: 27.6 pg (ref 26.0–34.0)
MCHC: 34.7 g/dL (ref 30.0–36.0)
MCV: 79.5 fL — ABNORMAL LOW (ref 80.0–100.0)
Monocytes Absolute: 0.8 10*3/uL (ref 0.1–1.0)
Monocytes Relative: 3 %
Neutro Abs: 25 10*3/uL — ABNORMAL HIGH (ref 1.7–7.7)
Neutrophils Relative %: 93 %
Platelets: 177 10*3/uL (ref 150–400)
RBC: 2.39 MIL/uL — ABNORMAL LOW (ref 3.87–5.11)
RDW: 18.6 % — ABNORMAL HIGH (ref 11.5–15.5)
WBC: 26.9 10*3/uL — ABNORMAL HIGH (ref 4.0–10.5)
nRBC: 0 /100 WBC
nRBC: 0.1 % (ref 0.0–0.2)

## 2022-04-30 LAB — HEMOGLOBIN AND HEMATOCRIT, BLOOD
HCT: 20.8 % — ABNORMAL LOW (ref 36.0–46.0)
HCT: 20.7 % — ABNORMAL LOW (ref 36.0–46.0)
Hemoglobin: 7.1 g/dL — ABNORMAL LOW (ref 12.0–15.0)
Hemoglobin: 7.2 g/dL — ABNORMAL LOW (ref 12.0–15.0)

## 2022-04-30 LAB — PREPARE RBC (CROSSMATCH)

## 2022-04-30 MED ORDER — INSULIN GLARGINE-YFGN 100 UNIT/ML ~~LOC~~ SOLN
8.0000 [IU] | Freq: Every day | SUBCUTANEOUS | Status: DC
  Administered 2022-04-30 – 2022-05-08 (×9): 8 [IU] via SUBCUTANEOUS
  Filled 2022-04-30 (×10): qty 0.08

## 2022-04-30 MED ORDER — SODIUM CHLORIDE 0.9% IV SOLUTION
Freq: Once | INTRAVENOUS | Status: AC
Start: 2022-04-30 — End: 2022-04-30

## 2022-04-30 MED ORDER — NA FERRIC GLUC CPLX IN SUCROSE 12.5 MG/ML IV SOLN
125.0000 mg | Freq: Once | INTRAVENOUS | Status: AC
  Administered 2022-04-30: 125 mg via INTRAVENOUS
  Filled 2022-04-30: qty 10

## 2022-04-30 MED ORDER — IOHEXOL 9 MG/ML PO SOLN
ORAL | Status: AC
  Filled 2022-04-30: qty 1000

## 2022-04-30 NOTE — Progress Notes (Signed)
ANTICOAGULATION CONSULT NOTE - Follow-up  Pharmacy Consult for Enoxaparin  Indication: pulmonary embolus  Allergies  Allergen Reactions   Ibuprofen Other (See Comments)    Pt is unable to take this due to kidney problems.      Penicillins Rash and Other (See Comments)    Has patient had a PCN reaction causing immediate rash, facial/tongue/throat swelling, SOB or lightheadedness with hypotension: No Has patient had a PCN reaction causing severe rash involving mucus membranes or skin necrosis: No Has patient had a PCN reaction that required hospitalization No Has patient had a PCN reaction occurring within the last 10 years: No If all of the above answers are "NO", then may proceed with Cephalosporin use.    Patient Measurements: Height: 5\' 5"  (165.1 cm) Weight: 100.7 kg (222 lb 0.1 oz) IBW/kg (Calculated) : 57  Vital Signs: Temp: 98 F (36.7 C) (07/09 0615) Temp Source: Oral (07/09 0615) BP: 152/80 (07/09 0615) Pulse Rate: 59 (07/09 0615)  Labs: Recent Labs    04/28/22 0400 04/28/22 0520 04/28/22 1750 04/29/22 0455 04/30/22 0145 04/30/22 0907  HGB 5.6* 6.3*   < > 6.0* 6.6* 7.1*  HCT 16.5* 18.6*   < > 17.6* 19.0* 20.8*  PLT 114* 106*  --  163 177  --   CREATININE 4.30*  --   --  3.11* 3.69*  --   CKTOTAL 1,772*  --   --   --   --   --    < > = values in this interval not displayed.     Estimated Creatinine Clearance: 20 mL/min (A) (by C-G formula based on SCr of 3.69 mg/dL (H)).   Medical History: Past Medical History:  Diagnosis Date   Chronic cough    Chronic pain    Diabetes mellitus without complication (HCC)    Dyspnea    GERD (gastroesophageal reflux disease)    Hypokalemia    Hypothyroidism    Left wrist pain 06/01/2021   Myonecrosis (HCC) 07/01/2021   Neurocardiogenic syncope    OSA (obstructive sleep apnea)    does not use CPAP   Osteomyelitis (HCC)    bilateral feet   Peripheral neuropathy    Presence of intrathecal pump    recieves  Prialt/bupivicaine   Tear of gluteus medius tendon 07/01/2021   Wegener's granulomatosis 2009   Wegner's disease (congenital syphilitic osteochondritis)     Medications:  Scheduled:   iohexol       sodium chloride   Intravenous Once   sodium chloride   Intravenous Once   Chlorhexidine Gluconate Cloth  6 each Topical Daily   DULoxetine  30 mg Oral Daily   folic acid  2 mg Oral Daily   guaiFENesin-dextromethorphan  10 mL Oral Q12H   insulin aspart  0-9 Units Subcutaneous TID WC   insulin glargine-yfgn  8 Units Subcutaneous QHS   linaclotide  145 mcg Oral QAC breakfast   pantoprazole  40 mg Oral Daily   predniSONE  15 mg Oral Q supper   predniSONE  20 mg Oral Q breakfast   senna  2 tablet Oral BID   vitamin B-12  1,000 mcg Oral Daily    Assessment: 56 yo F presenting with AMS/sepsis, on apixaban PTA for recent PE (03/27/2022). Last apixaban dose on 7/2 AM ~ 0800. Last dose of lovenox 7/6. The patient was initially having issues with bleeding from HD catheter site and from her rectum, although this has not been observed for the past several days. FOBT (-)  7/8 and iron studies revealed low iron and TSAT despite PRBC administration. Patient currently has no signs of overt bleeding, but Hgb continues to drop below 7. CT abdomen/pelvis ordered to evaluate for possible retroperitoneal hemorrhage.    Goal of Therapy:  Monitor platelets by anticoagulation protocol: Yes   Plan:  Continue to hold lovenox (restart as able)  F/U CT abd/pelvis  Monitor CBC and for signs/symptoms of bleeding.   Jani Gravel, PharmD PGY-2 Infectious Diseases Resident  04/30/2022 10:44 AM

## 2022-04-30 NOTE — Consult Note (Signed)
Reason for Consult:R gluteal hematoma Referring Physician: Karen ChafeRavi, Pahwani  [name] is an 56 y.o. female.  HPI: 56yo F with an extensive past medical history including Wegener's granulomatosis, diabetes mellitus, and significant peripheral neuropathy was admitted 04/17/2022 with altered mental status.  She is being treated for acute kidney injury requiring dialysis and acute hypoxic respiratory failure.  She has a history of chronic anemia but has had declining hemoglobin over several days down to as low as 5.6 requiring transfusions.  She has undergone a thorough work-up by the hospitalist service to find a source of this blood loss and this included CT scan of her abdomen pelvis.  This demonstrates a right intramuscular hematoma involving the gluteus minimus and piriformis muscles.  I was asked to see her for surgical management.  She is not on any anticoagulation.  I spoke with her husband regarding the day she was admitted and he reports that he was at work and another family member was also unable to get in touch with her and she was found on the floor.  She is a very poor historian and her husband helps significantly with history.  Past Medical History:  Diagnosis Date   Chronic cough    Chronic pain    Diabetes mellitus without complication (HCC)    Dyspnea    GERD (gastroesophageal reflux disease)    Hypokalemia    Hypothyroidism    Left wrist pain 06/01/2021   Myonecrosis (HCC) 07/01/2021   Neurocardiogenic syncope    OSA (obstructive sleep apnea)    does not use CPAP   Osteomyelitis (HCC)    bilateral feet   Peripheral neuropathy    Presence of intrathecal pump    recieves Prialt/bupivicaine   Tear of gluteus medius tendon 07/01/2021   Wegener's granulomatosis 2009   Wegner's disease (congenital syphilitic osteochondritis)     Past Surgical History:  Procedure Laterality Date   AMPUTATION Right 10/21/2020   Procedure: Right hallux amputation;  Surgeon: Toni ArthursHewitt, John, MD;   Location: [facility];  Service: Orthopedics;  Laterality: Right;   AMPUTATION TOE Bilateral 08/12/2020   Procedure: Right 3rd toe amputation; left hallux and 3rd toe amputation;  Surgeon: Toni ArthursHewitt, John, MD;  Location: [facility];  Service: Orthopedics;  Laterality: Bilateral;  45min   AMPUTATION TOE Right 03/24/2021   Procedure: AMPUTATION TOE;  Surgeon: Toni ArthursHewitt, John, MD;  Location: [facility];  Service: Orthopedics;  Laterality: Right;   APPENDECTOMY     BACK SURGERY     BIOPSY  03/30/2022   Procedure: BIOPSY;  Surgeon: Tomma Lightninglalere, Adewale A, MD;  Location: [facility];  Service: Pulmonary;;   BRONCHIAL WASHINGS  03/30/2022   Procedure: BRONCHIAL WASHINGS;  Surgeon: Tomma Lightninglalere, Adewale A, MD;  Location: [facility];  Service: Pulmonary;;   CHOLECYSTECTOMY     COLONOSCOPY N/A 06/02/2013   Procedure: COLONOSCOPY;  Surgeon: Graylin ShiverSalem F Ganem, MD;  [facility];  Service: Endoscopy;  Laterality: N/A;   HERNIA REPAIR  2000   INCISION AND DRAINAGE ABSCESS Left 07/07/2016   Procedure: DEBRIDMENT LEFT THIGH ABSCESS, EXISION ACUTE SKIN RASH LEFT THIGH(1CM LESION);  Surgeon: Claud KelpHaywood Ingram, MD;  Location: [facility];  Service: General;  Laterality: Left;   IR FLUORO GUIDE CV LINE RIGHT  04/24/2022   IR US GUIDE VASC ACCESS RIGHT  04/24/2022   SEPTOPLASTY  02/2013   spleen anuyism     splenic aneurysm     TRANSMETATARSAL AMPUTATION Bilateral 10/21/2020   Procedure: Left transmetatarsal amputation;  Surgeon: Victorino DikeHewitt,  Jonny Ruiz, MD;  Location: [facility];  Service: Orthopedics;  Laterality: Bilateral;   TRANSMETATARSAL AMPUTATION Left 03/24/2021   Procedure: TRANSMETATARSAL AMPUTATION;  Surgeon: Toni Arthurs, MD;  Location: [facility];  Service: Orthopedics;  Laterality: Left;   VAGINAL HYSTERECTOMY     VIDEO BRONCHOSCOPY Right 03/30/2022   Procedure: VIDEO BRONCHOSCOPY WITH FLUORO;  Surgeon: Tomma Lightning, MD;  Location: [facility];  Service: Pulmonary;  Laterality: Right;  atypical  pneumonia    Family History  Problem Relation Age of Onset   Diabetes Mother    Heart disease Mother    Diabetes Father     Social History:  reports that she has never smoked. She has never used smokeless tobacco. She reports current alcohol use. She reports that she does not use drugs.  Allergies:  Allergies  Allergen Reactions   Ibuprofen Other (See Comments)    Pt is unable to take this due to kidney problems.      Penicillins Rash and Other (See Comments)    Has patient had a PCN reaction causing immediate rash, facial/tongue/throat swelling, SOB or lightheadedness with hypotension: No Has patient had a PCN reaction causing severe rash involving mucus membranes or skin necrosis: No Has patient had a PCN reaction that required hospitalization No Has patient had a PCN reaction occurring within the last 10 years: No If all of the above answers are "NO", then may proceed with Cephalosporin use.    Medications: I have reviewed the patient's current medications.  Results for orders placed or performed during the hospital encounter of 04/17/22 (from the past 48 hour(s))  Glucose, capillary     Status: Abnormal   Collection Time: 04/28/22 10:24 PM  Result Value Ref Range   Glucose-Capillary 134 (H) 70 - 99 mg/dL    Comment: Glucose reference range applies only to samples taken after fasting for at least 8 hours.  Renal function panel     Status: Abnormal   Collection Time: 04/29/22  4:55 AM  Result Value Ref Range   Sodium 136 135 - 145 mmol/L   Potassium 3.7 3.5 - 5.1 mmol/L   Chloride 98 98 - 111 mmol/L   CO2 27 22 - 32 mmol/L   Glucose, Bld 136 (H) 70 - 99 mg/dL    Comment: Glucose reference range applies only to samples taken after fasting for at least 8 hours.   BUN 36 (H) 6 - 20 mg/dL   Creatinine, Ser 6.54 (H) 0.44 - 1.00 mg/dL   Calcium 7.4 (L) 8.9 - 10.3 mg/dL   Phosphorus 4.9 (H) 2.5 - 4.6 mg/dL   Albumin 1.7 (L) 3.5 - 5.0 g/dL   GFR, Estimated 17 (L) >60 mL/min     Comment: (NOTE) Calculated using the CKD-EPI Creatinine Equation (2021)    Anion gap 11 5 - 15    Comment: Performed at [facility], [address], [place], [place] [address]  CBC with Differential/Platelet     Status: Abnormal   Collection Time: 04/29/22  4:55 AM  Result Value Ref Range   WBC 22.1 (H) 4.0 - 10.5 K/uL   RBC 2.08 (L) 3.87 - 5.11 MIL/uL   Hemoglobin 6.0 (LL) 12.0 - 15.0 g/dL    Comment: REPEATED TO VERIFY THIS CRITICAL RESULT HAS VERIFIED AND BEEN CALLED TO JULIET V. RN BY RAY CUENCA ON 07 08 2023 AT 0516, AND HAS BEEN READ BACK.     HCT 17.6 (L) 36.0 - 46.0 %   MCV 84.6  80.0 - 100.0 fL   MCH 28.8 26.0 - 34.0 pg   MCHC 34.1 30.0 - 36.0 g/dL   RDW 16.1 (H) 09.6 - 04.5 %   Platelets 163 150 - 400 K/uL   nRBC 0.5 (H) 0.0 - 0.2 %   Neutrophils Relative % 86 %   Neutro Abs 19.0 (H) 1.7 - 7.7 K/uL   Lymphocytes Relative 8 %   Lymphs Abs 1.8 0.7 - 4.0 K/uL   Monocytes Relative 6 %   Monocytes Absolute 1.3 (H) 0.1 - 1.0 K/uL   Eosinophils Relative 0 %   Eosinophils Absolute 0.0 0.0 - 0.5 K/uL   Basophils Relative 0 %   Basophils Absolute 0.0 0.0 - 0.1 K/uL   nRBC 0 0 /100 WBC   Abs Immature Granulocytes 0.00 0.00 - 0.07 K/uL    Comment: Performed at [facility], [address], [place], [place] [address]  Prepare RBC (crossmatch)     Status: None   Collection Time: 04/29/22  5:30 AM  Result Value Ref Range   Order Confirmation      ORDER PROCESSED BY BLOOD BANK Performed at [facility], [address], [place], [place] [address]   Glucose, capillary     Status: Abnormal   Collection Time: 04/29/22  6:28 AM  Result Value Ref Range   Glucose-Capillary 120 (H) 70 - 99 mg/dL    Comment: Glucose reference range applies only to samples taken after fasting for at least 8 hours.  Glucose, capillary     Status: Abnormal   Collection Time: 04/29/22 11:23 AM  Result Value Ref Range   Glucose-Capillary 147 (H) 70 - 99 mg/dL    Comment: Glucose  reference range applies only to samples taken after fasting for at least 8 hours.   Comment 1 Notify RN    Comment 2 Document in Chart   Iron and TIBC     Status: Abnormal   Collection Time: 04/29/22  2:00 PM  Result Value Ref Range   Iron 26 (L) 28 - 170 ug/dL   TIBC 829 (L) 562 - 130 ug/dL   Saturation Ratios 12 10.4 - 31.8 %   UIBC 187 ug/dL    Comment: Performed at [facility], [address] [place], [place] [address]  Glucose, capillary     Status: Abnormal   Collection Time: 04/29/22  5:12 PM  Result Value Ref Range   Glucose-Capillary 182 (H) 70 - 99 mg/dL    Comment: Glucose reference range applies only to samples taken after fasting for at least 8 hours.   Comment 1 Notify RN    Comment 2 Document in Chart   Occult blood card to lab, stool     Status: None   Collection Time: 04/29/22  6:03 PM  Result Value Ref Range   Fecal Occult Bld NEGATIVE NEGATIVE    Comment: Performed at [facility], [address], [place], [place] [address]  Glucose, capillary     Status: Abnormal   Collection Time: 04/29/22  9:40 PM  Result Value Ref Range   Glucose-Capillary 173 (H) 70 - 99 mg/dL    Comment: Glucose reference range applies only to samples taken after fasting for at least 8 hours.   Comment 1 Notify RN    Comment 2 Document in Chart   Renal function panel     Status: Abnormal   Collection Time: 04/30/22  1:45 AM  Result Value Ref Range  Sodium 137 135 - 145 mmol/L   Potassium 4.3 3.5 - 5.1 mmol/L   Chloride 96 (L) 98 - 111 mmol/L   CO2 25 22 - 32 mmol/L   Glucose, Bld 162 (H) 70 - 99 mg/dL    Comment: Glucose reference range applies only to samples taken after fasting for at least 8 hours.   BUN 49 (H) 6 - 20 mg/dL   Creatinine, Ser 1.61 (H) 0.44 - 1.00 mg/dL   Calcium 7.6 (L) 8.9 - 10.3 mg/dL   Phosphorus 5.8 (H) 2.5 - 4.6 mg/dL   Albumin 1.8 (L) 3.5 - 5.0 g/dL   GFR, Estimated 14 (L) >60 mL/min    Comment: (NOTE) Calculated using the CKD-EPI  Creatinine Equation (2021)    Anion gap 16 (H) 5 - 15    Comment: Performed at [facility], [address] [place], [place] [address]  CBC with Differential/Platelet     Status: Abnormal   Collection Time: 04/30/22  1:45 AM  Result Value Ref Range   WBC 26.9 (H) 4.0 - 10.5 K/uL   RBC 2.39 (L) 3.87 - 5.11 MIL/uL   Hemoglobin 6.6 (LL) 12.0 - 15.0 g/dL    Comment: CRITICAL VALUE NOTED.  VALUE IS CONSISTENT WITH PREVIOUSLY REPORTED AND CALLED VALUE. REPEATED TO VERIFY    HCT 19.0 (L) 36.0 - 46.0 %   MCV 79.5 (L) 80.0 - 100.0 fL   MCH 27.6 26.0 - 34.0 pg   MCHC 34.7 30.0 - 36.0 g/dL   RDW 54.0 (H) 98.1 - 19.1 %   Platelets 177 150 - 400 K/uL   nRBC 0.1 0.0 - 0.2 %   Neutrophils Relative % 93 %   Neutro Abs 25.0 (H) 1.7 - 7.7 K/uL   Lymphocytes Relative 4 %   Lymphs Abs 1.1 0.7 - 4.0 K/uL   Monocytes Relative 3 %   Monocytes Absolute 0.8 0.1 - 1.0 K/uL   Eosinophils Relative 0 %   Eosinophils Absolute 0.0 0.0 - 0.5 K/uL   Basophils Relative 0 %   Basophils Absolute 0.0 0.0 - 0.1 K/uL   nRBC 0 0 /100 WBC   Abs Immature Granulocytes 0.00 0.00 - 0.07 K/uL   Polychromasia PRESENT     Comment: Performed at [facility], [address], [place], [place] [address]  Prepare RBC (crossmatch)     Status: None   Collection Time: 04/30/22  2:42 AM  Result Value Ref Range   Order Confirmation      ORDER PROCESSED BY BLOOD BANK Performed at [facility], [address] [place], [place] [address]   Glucose, capillary     Status: Abnormal   Collection Time: 04/30/22  6:06 AM  Result Value Ref Range   Glucose-Capillary 145 (H) 70 - 99 mg/dL    Comment: Glucose reference range applies only to samples taken after fasting for at least 8 hours.   Comment 1 Notify RN    Comment 2 Document in Chart   Hemoglobin and hematocrit, blood     Status: Abnormal   Collection Time: 04/30/22  9:07 AM  Result Value Ref Range   Hemoglobin 7.1 (L) 12.0 - 15.0 g/dL   HCT 08.6 (L) 57.8 -  46.0 %    Comment: Performed at [facility], [address], [place], [place] [address]  Type and screen [facility]     Status: None   Collection Time: 04/30/22  9:15 AM  Result  Value Ref Range   ABO/RH(D) O POS    Antibody Screen NEG    Sample Expiration      05/03/2022,2359 Performed at [facility], [address] [place], [place] [address]   Glucose, capillary     Status: Abnormal   Collection Time: 04/30/22  4:41 PM  Result Value Ref Range   Glucose-Capillary 147 (H) 70 - 99 mg/dL    Comment: Glucose reference range applies only to samples taken after fasting for at least 8 hours.    CT ABDOMEN PELVIS WO CONTRAST  Result Date: 04/30/2022 CLINICAL DATA:  Retroperitoneal hematoma. EXAM: CT ABDOMEN AND PELVIS WITHOUT CONTRAST TECHNIQUE: Multidetector CT imaging of the abdomen and pelvis was performed following the standard protocol without IV contrast. RADIATION DOSE REDUCTION: This exam was performed according to the departmental dose-optimization program which includes automated exposure control, adjustment of the mA and/or kV according to patient size and/or use of iterative reconstruction technique. COMPARISON:  Renal ultrasound 04/18/2022. CT chest abdomen and pelvis 07/09/2016. FINDINGS: Lower chest: There is some atelectasis in the bilateral lung bases. There is a trace right pleural effusion. Hepatobiliary: No focal liver abnormality is seen. Status post cholecystectomy. No biliary dilatation. Pancreas: Unremarkable. No pancreatic ductal dilatation or surrounding inflammatory changes. Spleen: Normal in size without focal abnormality. Adrenals/Urinary Tract: The kidneys and adrenal glands are within normal limits. The bladder is completely decompressed by Foley catheter. Stomach/Bowel: Stomach is within normal limits. No evidence of bowel wall thickening, distention, or inflammatory changes. The appendix is not seen. There is descending and sigmoid colon  diverticulosis. Rectal tube/catheter in place. Vascular/Lymphatic: Aortic atherosclerosis. No enlarged abdominal or pelvic lymph nodes. Reproductive: Status post hysterectomy. No adnexal masses. Other: Presacral edema is present. There is no ascites. There is no retroperitoneal hematoma. There is diffuse body wall edema. No focal abdominal wall hernia. Thoracic catheter/stimulator device is present with generator in the left anterior abdominal wall, unchanged. Musculoskeletal: No acute osseous findings. There is heterogeneous hyperdensity expanding the right gluteus minimus and piriformis musculature measuring 7.4 x 13.8 x 14.1 cm in largest dimensions worrisome for intramuscular hematoma. IMPRESSION: 1. Heterogeneous hyperdensity expanding the right gluteus minimus and piriformis musculature worrisome for intramuscular hematoma. 2. No retroperitoneal hematoma. 3. Diffuse body wall edema and presacral edema. 4. Trace right pleural effusion with bibasilar atelectasis. 5. Colonic diverticulosis without evidence for acute diverticulitis. 6. Foley catheter. 7.  Aortic Atherosclerosis (ICD10-I70.0). Electronically Signed   By: Darliss Cheney M.D.   On: 04/30/2022 16:29    Review of Systems  Unable to perform ROS: Mental status change   Blood pressure (!) 141/72, pulse 68, temperature 97.6 F (36.4 C), temperature source Oral, resp. rate (!) 25, height 5\' 5"  (1.651 m), weight 100.7 kg, SpO2 99 %. Physical Exam Constitutional:      Comments: Chronically ill-appearing  Cardiovascular:     Rate and Rhythm: Normal rate and regular rhythm.  Pulmonary:     Effort: Pulmonary effort is normal.     Breath sounds: Normal breath sounds.  Abdominal:     General: Abdomen is flat.     Palpations: Abdomen is soft.     Tenderness: There is no abdominal tenderness. There is no guarding or rebound.     Comments: Truncal anasarca, superficial contusion type areas bilateral sides of her pannus without cellulitis   Musculoskeletal:     Comments: Palpable hematoma deep right gluteal region without overlying cellulitis or evidence of infection.  The area is tender as are  several areas of her body. Bilateral transmet amp  Skin:    Coloration: Skin is pale.     Assessment/Plan: Intramuscular hematoma involving the right gluteus minimus and piriformis muscles.  I feel this likely occurred with some contusion in her gluteal muscle when she fell prior to presentation and has gradually formed over time.  No need for acute surgical intervention.  I recommend avoiding anticoagulation and placement of an abdominal binder for some compression.  I agree with transfusion as needed.  I spoke in detail with her husband.  We will follow.  Liz Malady 04/30/2022, 6:40 PM

## 2022-04-30 NOTE — Progress Notes (Signed)
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   Patient presents with worsening intermittent altered mental status and increased weakness.  She has been declining since approximately January, 6 months ago per husband.  She was recently admitted to our facility and treated for pneumonia with moderate improvement.  She was to continue on vancomycin through 04/08/2022 followed by infectious disease.  Bronchoscopy during that admission on 03/30/2022 shows Candida in the setting of ongoing antibiotics and urine culture ultimately grew yeast - deemed to be colonized and not active infection given fungitel was reassuringly low per ID discussion. Of note she is also had a recent diagnosis of pulmonary embolism on apixaban.   Patient admitted this hospitalization (on 04/17/22) with elevated white count, lactic acid, and acute renal failure in the setting of poor p.o. intake altered mental status tachycardia and tachypnea concerning for sepsis. Patient's infectious source was presumed to be pneumonia versus UTI - placed initially on broad-spectrum antibiotics of vancomycin and cefepime. Her sepsis criteria did not improve and her renal function continued to worsen in the setting of presumed sepsis and rhabdomyolysis (ATN). Nephrology following with worsening mental status, tremors/muscle spasms with metabolic derangements. Patient transferred to main campus for neurology evaluation, EEG ultimately unremarkable for seizures-her movement was considered to be musculoskeletal, somewhat choreiform in nature, holding off on LP per neuro given improvements with HD and intrathecal pump turned down (unable to be turned  off but can be turned to [facility]).  Assessment & Plan:   Principal Problem:   Acute metabolic encephalopathy Active Problems:   Acute cystitis without hematuria   Current chronic use of systemic steroids   Chronic pain   Secondary adrenal insufficiency (HCC)   AKI (acute kidney injury) (HCC)   Granulomatosis with polyangiitis (HCC)   GERD (gastroesophageal reflux disease)   Essential hypertension   Obesity (BMI 30-39.9)   COVID-19 virus infection   Severe sepsis (HCC)   Pulmonary embolism (HCC)   Transaminitis   Rhabdomyolysis  Acute metabolic encephalopathy, POA, multifactorial - Initially presumed to be secondary to sepsis/renal failure/hyponatremia  - Potential covid encephalitis, however spastic choreiform movements are not consistent with COVID encephalitis - MRI unremarkable for acute process  - EEG unremarkable for seizure - Discontinued all home CNS depressant medications - continued Cymbalta only - Mental status improving after initiating dialysis and discontinuation of intrathecal pain pump - Discussed with neurology.  Holding off on LP and CTA head at this time with improvement.  Neurology signed off 7/6   Acute blood loss anemia on anemia of chronic kidney disease, history of chronic iron deficiency anemia: Noted some bleeding from her dialysis catheter as well as minimal rectal bleeding with history of hemorrhoids and fissure - Transfused 2 unit packed red blood cell 7/6, 1 unit 7/7 and 1 unit on 04/29/2022 , hemoglobin improved from 6 yesterday to 6.6 this morning but is still under 7 so 1 unit of transfusion is ordered again.  FOBT negative indicating low chances of GI bleed however we need 3 FOBT to rule out GI bleed so I will order more.  We will go ahead and order CT abdomen pelvis to rule out retroperitoneal hemorrhage as this is significant drop in hemoglobin for the tiny amount of hemorrhage at the  dialysis center several days ago.  Son tells me that patient does  receive biweekly iron infusion as outpatient through her rheumatologist.  I will go ahead and give her 1 dose of IV iron infusion here.  Please note following: - Followed by [facility] with history of chronic normocytic anemia as well as iron deficiency anemia with hemoglobin ranging from 8.9-13.6.  Has been getting outpatient iron infusion biweekly. - Followed by [facility].  Had normal colonoscopy 2014, flex sigmoidoscopy 2020 for rectal bleeding which revealed deep anal fissure and external hemorrhoids.  Underwent Botox, then lateral internal anal sphincterotomy.  EGD November 2022 was normal.  Colonoscopy revealed multiple diverticula, internal hemorrhoids, 3 polyps. Suspect this is likely due to either hemorrhoids/fissure/diverticula in setting of chronic iron deficiency and anemia of chronic disease.    Polypharmacy risk - Home medications include bupivacaine/zinconotide intrathecal baclofen pump, duloxetine, gabapentin, methocarbamol, nucynta, oxycodone, Topamax.   Chronic pain, neuropathy, and chronic opioid dependence  - Patient has intrathecal pump with bupivacaine and ziconotide - turned down to lowest setting 7/3 (cannot stop completely per tech) - Consider restarting low dose PO oxy vs nucynta    AKI/ATN currently requiring HD - Complicated and multifactorial etiology, possibly ATN per nephrology - Temp HD catheter initially placed 04/24/2022 - issues with bleeding, but no more.  Last hemodialysis 04/28/2022.  Nephrology plans to watch over the weekend.  Some rise in creatinine, nephrology watching, planning on kidney biopsy next week.   Granulomatosis with polyangiitis  - Patient receiving rituximab and methotrexate as an outpatient (most recently at [facility] in May 2023) - confirmed with rheumatology at [facility] (Dr. Dimple Casey) - Nephrology sent off multiple biomarkers: *Of note C4, and Kappa light chains* were elevated minimally but otherwise ANCA, ANA, RF, anti-dsDNA, c3, spep/sflc  without abnormalities, hepatitis panel, HIV, EBV, CMV were unremarkable - Cryos still pending  - Continue prednisone 20mg  qAM and 15mg  qPM - continue to wean to baseline level (5mg  daily)    Secondary adrenal insufficiency  -Continue steroids    Current chronic use of systemic steroids -On 5 mg daily at baseline.  Continue to wean   Acute hypoxic respiratory failure, POA -Multifactorial in the setting of metabolic encephalopathy, presumed pneumonia, granulomatosis/polyangiitis, concurrent COVID-19 infection -Potentially exacerbated by hypervolemia surrounding renal failure -Resolved and remains on room air   Presumed severe sepsis secondary to pneumonia versus UTI -Completed 5 days of antibiotic therapy   C. difficile colonization -C. difficile toxin is negative.  No indication of treatment at this time   GERD  -PPI    Rhabdomyolysis -Secondary to prolonged downtime -Improved   Elevated transaminitis -Most likely in the setting of sepsis/shock liver and rhabdomyolysis. -Improved   History of pulmonary embolism  -Lovenox on hold due to recurrent anemia requiring blood transfusion.   COVID-19 virus infection, questionably incidental - COVID PCR incidentally positive at time of admission, without respiratory symptoms - Remdesivir withheld due to elevated liver enzymes - Completed 10-day quarantine   Diabetes mellitus -A1c 8.6 -Blood sugar controlled, continue current dose of Semglee, sliding scale insulin   Obesity  Estimated body mass index is 38.37 kg/m as calculated from the following:   Height as of this encounter: 5\' 5"  (1.651 m).   Weight as of this encounter: 104.6 kg.  DVT prophylaxis: Place and maintain sequential compression device Start: 04/28/22 1251   Code Status: Full Code  Family Communication: None present at bedside.  Plan of care discussed with patient in length and he/she verbalized  understanding and agreed with it.  Status is: Inpatient Remains  inpatient appropriate because: Recurrent bleeding requiring blood transfusion and worsening creatinine.   Estimated body mass index is 36.94 kg/m as calculated from the following:   Height as of this encounter:  (1.651 m).   Weight as of this encounter: 100.7 kg.    Nutritional Assessment: Body mass index is 36.94 kg/m.Marland Kitchen Seen by dietician.  I agree with the assessment and plan as outlined below: Nutrition Status . Skin Assessment: I have examined the patient's skin and I agree with the wound assessment as performed by the wound care RN as outlined below:    Consultants:  Nephrology Neurology-signed off  Procedures:  As above  Antimicrobials:  Anti-infectives (From admission, onward)    Start     Dose/Rate Route Frequency Ordered Stop   04/21/22 2200  meropenem (MERREM) 500 mg in sodium chloride 0.9 % 100 mL IVPB  Status:  Discontinued        500 mg 200 mL/hr over 30 Minutes Intravenous Every 12 hours 04/21/22 1507 04/22/22 1945   04/21/22 1000  fluconazole (DIFLUCAN) tablet 100 mg  Status:  Discontinued        100 mg Oral Daily 04/20/22 0952 04/22/22 1945   04/20/22 1500  meropenem (MERREM) 1 g in sodium chloride 0.9 % 100 mL IVPB  Status:  Discontinued        1 g 200 mL/hr over 30 Minutes Intravenous Every 12 hours 04/20/22 1408 04/21/22 1507   04/20/22 1045  fluconazole (DIFLUCAN) tablet 200 mg        200 mg Oral  Once 04/20/22 0952 04/20/22 1207   04/20/22 1021  ceFEPIme (MAXIPIME) 2 g in sodium chloride 0.9 % 100 mL IVPB  Status:  Discontinued        2 g 200 mL/hr over 30 Minutes Intravenous Every 24 hours 04/19/22 1355 04/20/22 1334   04/19/22 1100  linezolid (ZYVOX) IVPB 600 mg  Status:  Discontinued        600 mg 300 mL/hr over 60 Minutes Intravenous Every 12 hours 04/19/22 1014 04/22/22 1945   04/18/22 1200  vancomycin (VANCOREADY) IVPB 750 mg/150 mL  Status:  Discontinued        750 mg 150 mL/hr over 60 Minutes Intravenous Every 24 hours 04/17/22 1306  04/19/22 1014   04/17/22 2200  ceFEPIme (MAXIPIME) 2 g in sodium chloride 0.9 % 100 mL IVPB  Status:  Discontinued        2 g 200 mL/hr over 30 Minutes Intravenous Every 12 hours 04/17/22 1306 04/19/22 1355   04/17/22 1300  vancomycin (VANCOREADY) IVPB 2000 mg/400 mL        2,000 mg 200 mL/hr over 120 Minutes Intravenous  Once 04/17/22 1208 04/17/22 1729   04/17/22 1200  ceFEPIme (MAXIPIME) 2 g in sodium chloride 0.9 % 100 mL IVPB        2 g 200 mL/hr over 30 Minutes Intravenous  Once 04/17/22 1153 04/17/22 1330   04/17/22 1200  metroNIDAZOLE (FLAGYL) IVPB 500 mg        500 mg 100 mL/hr over 60 Minutes Intravenous  Once 04/17/22 1153 04/17/22 1435   04/17/22 1200  vancomycin (VANCOCIN) IVPB 1000 mg/200 mL premix  Status:  Discontinued        1,000 mg 200 mL/hr over 60 Minutes Intravenous  Once 04/17/22 1153 04/17/22 1208         Subjective: Patient seen and examined, she has no complaints.  Objective:  Vitals:   04/30/22 0333 04/30/22 0353 04/30/22 0615 04/30/22 0900  BP: 139/69 129/72 (!) 152/80 114/69  Pulse: 81 66 (!) 59 87  Resp: 16 16 20 18   Temp: 98.5 F (36.9 C) 98 F (36.7 C) 98 F (36.7 C) (!) 97.4 F (36.3 C)  TempSrc: Oral Oral Oral Oral  SpO2: 94% 100% 94% 100%  Weight:      Height:        Intake/Output Summary (Last 24 hours) at 04/30/2022 1200 Last data filed at 04/30/2022 07/01/2022 Gross per 24 hour  Intake 670 ml  Output 850 ml  Net -180 ml    Filed Weights   04/26/22 1402 04/26/22 1914 04/28/22 1645  Weight: 105.2 kg 105.2 kg 100.7 kg    Examination:  General exam: Appears calm and comfortable  Respiratory system: Clear to auscultation. Respiratory effort normal. Cardiovascular system: S1 & S2 heard, RRR. No JVD, murmurs, rubs, gallops or clicks.  +2 pitting edema bilateral lower extremity Gastrointestinal system: Abdomen is nondistended, soft and nontender. No organomegaly or masses felt. Normal bowel sounds heard. Central nervous system: Alert and  oriented. No focal neurological deficits. Extremities: Symmetric 5 x 5 power.  Bilateral forefoot amputation. Skin: No rashes, lesions or ulcers.  Psychiatry: Judgement and insight appear normal. Mood & affect appropriate.   Data Reviewed: I have personally reviewed following labs and imaging studies  CBC: Recent Labs  Lab 04/27/22 0215 04/27/22 1130 04/28/22 0400 04/28/22 0520 04/28/22 1750 04/29/22 0455 04/30/22 0145 04/30/22 0907  WBC 14.8*  --  22.9* 19.5*  --  22.1* 26.9*  --   NEUTROABS  --   --   --   --   --  19.0* 25.0*  --   HGB 5.8*   < > 5.6* 6.3* 7.5* 6.0* 6.6* 7.1*  HCT 18.7*   < > 16.5* 18.6* 21.5* 17.6* 19.0* 20.8*  MCV 84.2  --  84.2 83.4  --  84.6 79.5*  --   PLT 141*  --  114* 106*  --  163 177  --    < > = values in this interval not displayed.    Basic Metabolic Panel: Recent Labs  Lab 04/26/22 0350 04/27/22 0215 04/28/22 0400 04/29/22 0455 04/30/22 0145  NA 136 135 138 136 137  K 4.0 4.0 3.8 3.7 4.3  CL 94* 100 97* 98 96*  CO2 24 27 25 27 25   GLUCOSE 84 149* 160* 136* 162*  BUN 64* 34* 49* 36* 49*  CREATININE 5.45* 3.53* 4.30* 3.11* 3.69*  CALCIUM 6.9* 7.0* 7.4* 7.4* 7.6*  PHOS 7.4* 5.4* 6.2* 4.9* 5.8*    GFR: Estimated Creatinine Clearance: 20 mL/min (A) (by C-G formula based on SCr of 3.69 mg/dL (H)). Liver Function Tests: Recent Labs  Lab 04/25/22 0022 04/26/22 0350 04/27/22 0215 04/28/22 0400 04/29/22 0455 04/30/22 0145  AST 95* 72* 54* 52*  --   --   ALT 77* 54* 40 34  --   --   ALKPHOS 67 56 54 44  --   --   BILITOT 0.7 1.1 1.0 1.1  --   --   PROT 5.0* 4.4* 4.1* 4.1*  --   --   ALBUMIN 1.9*  1.9* 1.8*  1.8* 1.6*  1.6* 1.7*  1.7* 1.7* 1.8*    No results for input(s): "LIPASE", "AMYLASE" in the last 168 hours. No results for input(s): "AMMONIA" in the last 168 hours. Coagulation Profile: No results for input(s): "INR", "PROTIME" in the last 168  hours. Cardiac Enzymes: Recent Labs  Lab 04/24/22 0816 04/25/22 0022  04/26/22 0350 04/27/22 0215 04/28/22 0400  CKTOTAL 4,049* 3,558* 2,171* 1,035* 1,772*    BNP (last 3 results) No results for input(s): "PROBNP" in the last 8760 hours. HbA1C: No results for input(s): "HGBA1C" in the last 72 hours. CBG: Recent Labs  Lab 04/29/22 0628 04/29/22 1123 04/29/22 1712 04/29/22 2140 04/30/22 0606  GLUCAP 120* 147* 182* 173* 145*    Lipid Profile: No results for input(s): "CHOL", "HDL", "LDLCALC", "TRIG", "CHOLHDL", "LDLDIRECT" in the last 72 hours. Thyroid Function Tests: No results for input(s): "TSH", "T4TOTAL", "FREET4", "T3FREE", "THYROIDAB" in the last 72 hours. Anemia Panel: Recent Labs    04/28/22 1232 04/29/22 1400  TIBC  --  213*  IRON  --  26*  RETICCTPCT 1.7  --     Sepsis Labs: No results for input(s): "PROCALCITON", "LATICACIDVEN" in the last 168 hours.  Recent Results (from the past 240 hour(s))  Gastrointestinal Panel by PCR , Stool     Status: None   Collection Time: 04/23/22  3:55 PM   Specimen: Stool  Result Value Ref Range Status   Campylobacter species NOT DETECTED NOT DETECTED Final   Plesimonas shigelloides NOT DETECTED NOT DETECTED Final   Salmonella species NOT DETECTED NOT DETECTED Final   Yersinia enterocolitica NOT DETECTED NOT DETECTED Final   Vibrio species NOT DETECTED NOT DETECTED Final   Vibrio cholerae NOT DETECTED NOT DETECTED Final   Enteroaggregative E coli (EAEC) NOT DETECTED NOT DETECTED Final   Enteropathogenic E coli (EPEC) NOT DETECTED NOT DETECTED Final   Enterotoxigenic E coli (ETEC) NOT DETECTED NOT DETECTED Final   Shiga like toxin producing E coli (STEC) NOT DETECTED NOT DETECTED Final   Shigella/Enteroinvasive E coli (EIEC) NOT DETECTED NOT DETECTED Final   Cryptosporidium NOT DETECTED NOT DETECTED Final   Cyclospora cayetanensis NOT DETECTED NOT DETECTED Final   Entamoeba histolytica NOT DETECTED NOT DETECTED Final   Giardia lamblia NOT DETECTED NOT DETECTED Final   Adenovirus F40/41  NOT DETECTED NOT DETECTED Final   Astrovirus NOT DETECTED NOT DETECTED Final   Norovirus GI/GII NOT DETECTED NOT DETECTED Final   Rotavirus A NOT DETECTED NOT DETECTED Final   Sapovirus (I, II, IV, and V) NOT DETECTED NOT DETECTED Final    Comment: Performed at [facility], [address] [place], [place] [address]  C Difficile Quick Screen (NO PCR Reflex)     Status: Abnormal   Collection Time: 04/23/22  3:57 PM   Specimen: Stool  Result Value Ref Range Status   C Diff antigen POSITIVE (A) NEGATIVE Final   C Diff toxin NEGATIVE NEGATIVE Final   C Diff interpretation   Final    Results are indeterminate. Please contact the provider listed for your campus for C diff questions in AMION.    Comment: Performed at [facility], [address] [place], [place] [address]     Radiology Studies: No results found.  Scheduled Meds:  sodium chloride   Intravenous Once   sodium chloride   Intravenous Once   Chlorhexidine Gluconate Cloth  6 each Topical Daily   DULoxetine  30 mg Oral Daily   folic acid  2 mg Oral Daily   guaiFENesin-dextromethorphan  10 mL Oral Q12H   insulin aspart  0-9 Units Subcutaneous TID WC   insulin glargine-yfgn  8 Units Subcutaneous QHS   iohexol       linaclotide  145 mcg Oral QAC breakfast  pantoprazole  40 mg Oral Daily   predniSONE  15 mg Oral Q supper   predniSONE  20 mg Oral Q breakfast   senna  2 tablet Oral BID   vitamin B-12  1,000 mcg Oral Daily   Continuous Infusions:  ferric gluconate (FERRLECIT) IVPB       LOS: 13 days   Hughie Closs, MD [facility]  04/30/2022, 12:00 PM   *Please note that this is a verbal dictation therefore any spelling or grammatical errors are due to the "[employer]agon [employer] One" system interpretation.  Please page via Amion and do not message via secure chat for urgent patient care matters. Secure chat can be used for non urgent patient care matters.  How to contact the [facility] Attending or Consulting  provider 7A - 7P or covering provider during after hours 7P -7A, for this patient?  Check the care team in [facility] and look for a) attending/consulting [facility] provider listed and b) the [facility] team listed. Page or secure chat 7A-7P. Log into [email or web address] and use [place]'s universal password to access. If you do not have the password, please contact the hospital operator. Locate the [facility] provider you are looking for under [facility] and page to a number that you can be directly reached. If you still have difficulty reaching the provider, please page the [facility] (Director on Call) for the Hospitalists listed on amion for assistance.

## 2022-04-30 NOTE — Progress Notes (Signed)
Received report about her CT scan from radiology that she has right gluteus minimus and pyriformis hematoma.  I have consulted general surgery and discussed the case with Dr. Janee Morn over the phone who is going to consult.  Repeating H&H.

## 2022-04-30 NOTE — Evaluation (Signed)
Occupational Therapy Evaluation Patient Details Name: [name] MRN: [id number] DOB: Dec 17, 1965 Today's Date: 04/30/2022   History of Present Illness Pt is a 56 y.o. female admitted 6/26 after being found by her husband on the floor at home with AMS. Admitted with dx of acute metabolic encephalopathy. Incidental COVID+ on admission. Initially admitted to [facility] and transferred to [facility] 7/1 for initiation of HD. Multiple recent hospitalizations, 4/3-5/4 and 6/4-6/14. PMH: PE, Wegener's disease, CKD 3, DM, HTN, chronic pain with an intrathecal pump, OSA, bilat mid-foot amputations, peripheral neuropathy   Clinical Impression   Pt admitted for concerns listed above. PTA pt reported that she was independent with all ADL's and IADL's, using no AD. At this time, pt presented with increased weakness, cognitive deficits, and decreased activity tolerance. Session was limited, as transport came to take pt to  CT mid-session. At this time recommending SNF due to pts weakness and increased need for assist. OT will continue to follow acutely.       Recommendations for follow up therapy are one component of a multi-disciplinary discharge planning process, led by the attending physician.  Recommendations may be updated based on patient status, additional functional criteria and insurance authorization.   Follow Up Recommendations  Skilled nursing-short term rehab (<3 hours/day)    Assistance Recommended at Discharge Frequent or constant Supervision/Assistance  Patient can return home with the following A lot of help with walking and/or transfers;A lot of help with bathing/dressing/bathroom;Assistance with cooking/housework;Assistance with feeding;Direct supervision/assist for medications management;Direct supervision/assist for financial management;Assist for transportation;Help with stairs or ramp for entrance    Functional Status Assessment  Patient has had a recent decline in their functional status and  demonstrates the ability to make significant improvements in function in a reasonable and predictable amount of time.  Equipment Recommendations  Other (comment) (TBD)    Recommendations for Other Services       Precautions / Restrictions Precautions Precautions: Fall;Other (comment) Precaution Comments: h/o bilateral transmet amputations, foley catheter, flexiseal, central line Restrictions Weight Bearing Restrictions: No      Mobility Bed Mobility Overal bed mobility: Needs Assistance Bed Mobility: Supine to Sit, Sit to Supine     Supine to sit: Max assist, HOB elevated Sit to supine: Max assist, HOB elevated   General bed mobility comments: Max due to weakness and difficutly following commands    Transfers                   General transfer comment: Unable to test due to transport arriving to take pt to CT      Balance                                           ADL either performed or assessed with clinical judgement   ADL Overall ADL's : Needs assistance/impaired Eating/Feeding: Minimal assistance;Sitting   Grooming: Minimal assistance;Sitting   Upper Body Bathing: Minimal assistance;Sitting   Lower Body Bathing: Maximal assistance;Sitting/lateral leans;Sit to/from stand   Upper Body Dressing : Minimal assistance;Sitting   Lower Body Dressing: Maximal assistance;Sitting/lateral leans;Sit to/from stand                 General ADL Comments: Pt limited this session due to transport coming to take pt to CT     Vision Baseline Vision/History: 1 Wears glasses Ability to See in Adequate Light: 0 Adequate Patient Visual Report:  No change from baseline Vision Assessment?: No apparent visual deficits     Perception     Praxis      Pertinent Vitals/Pain Pain Assessment Pain Assessment: No/denies pain     Hand Dominance Left   Extremity/Trunk Assessment Upper Extremity Assessment Upper Extremity Assessment: Generalized  weakness   Lower Extremity Assessment Lower Extremity Assessment: Generalized weakness       Communication Communication Communication: No difficulties   Cognition Arousal/Alertness: Awake/alert Behavior During Therapy: Flat affect Overall Cognitive Status: Difficult to assess                                 General Comments: Pt minimally verbally responsive and slow to respond     General Comments  VSS on RA - husband present and supportive, OT provided education to husband about need for Rehab and importance of mobility.    Exercises Exercises: Other exercises Other Exercises Other Exercises: BLE- heel slides, ankle pumps x5 Other Exercises: BUE - arm lifts, pull ups on bed rails   Shoulder Instructions      Home Living Family/patient expects to be discharged to:: Private residence Living Arrangements: Spouse/significant other Available Help at Discharge: Family;Available PRN/intermittently Type of Home: House Home Access: Stairs to enter [employer] of Steps: 2 Entrance Stairs-Rails: None Home Layout: One level     Bathroom Shower/Tub: Tub/shower unit;Walk-in shower   Bathroom Toilet: Standard Bathroom Accessibility: Yes How Accessible: Accessible via walker Home Equipment: Insurance risk surveyor (2 wheels);BSC/3in1;Cane - single point;Tub bench;Grab bars - tub/shower   Additional Comments: adjustable bed      Prior Functioning/Environment Prior Level of Function : Needs assist       Physical Assist : Mobility (physical);ADLs (physical) Mobility (physical): Transfers;Gait;Stairs;Bed mobility ADLs (physical): Dressing;Toileting;IADLs;Bathing Mobility Comments: Amb household distances with RW but primarily uses electric scooter in house and community ADLs Comments: Assisted for bathing and dressing. Pt able to complete toileting without assist. Grooming and feeding are independent. Assisted for IADL's by husband.         OT Problem List: Decreased strength;Decreased activity tolerance;Impaired balance (sitting and/or standing);Decreased cognition;Impaired sensation;Impaired tone;Impaired UE functional use      OT Treatment/Interventions: Self-care/ADL training;Therapeutic exercise;Neuromuscular education;Energy conservation;DME and/or AE instruction;Therapeutic activities;Cognitive remediation/compensation;Patient/family education;Balance training    OT Goals(Current goals can be found in the care plan section) Acute Rehab OT Goals Patient Stated Goal: per husband to get some independence back OT Goal Formulation: With patient/family Time For Goal Achievement: 05/14/22 Potential to Achieve Goals: Good ADL Goals Pt Will Perform Grooming: with set-up;sitting Pt Will Perform Lower Body Bathing: with min assist;sitting/lateral leans;sit to/from stand Pt Will Perform Lower Body Dressing: with min assist;sitting/lateral leans;sit to/from stand Pt Will Transfer to Toilet: with mod assist;ambulating Pt Will Perform Toileting - Clothing Manipulation and hygiene: with min assist;sitting/lateral leans;sit to/from stand  OT Frequency: Min 2X/week    Co-evaluation              AM-PAC OT "6 Clicks" Daily Activity     Outcome Measure Help from another person eating meals?: A Little Help from another person taking care of personal grooming?: A Little Help from another person toileting, which includes using toliet, bedpan, or urinal?: A Lot Help from another person bathing (including washing, rinsing, drying)?: A Lot Help from another person to put on and taking off regular upper body clothing?: A Little Help from another person to put on and taking off regular  lower body clothing?: A Lot 6 Click Score: 15   End of Session Nurse Communication: Mobility status  Activity Tolerance: Patient limited by lethargy Patient left: in bed;with call bell/phone within reach  OT Visit Diagnosis: Unsteadiness on feet  (R26.81);Other abnormalities of gait and mobility (R26.89);Muscle weakness (generalized) (M62.81)                Time: 3710-6269 OT Time Calculation (min): 25 min Charges:  OT General Charges $OT Visit: 1 Visit OT Evaluation $OT Eval Moderate Complexity: 1 Mod OT Treatments $Therapeutic Activity: 8-22 mins  Osinachi Navarrette H., OTR/L Acute Rehabilitation  Sera Hitsman Elane Gradie Butrick 04/30/2022, 7:04 PM

## 2022-04-30 NOTE — Progress Notes (Signed)
Patient ID: [name], female   DOB: Oct 31, 1965, 56 y.o.   MRN: [id number] [facility] Progress Note   Assessment/ Plan:   1. Acute kidney Injury: Nonoliguric overnight with rise of BUN and creatinine noted following her last hemodialysis on 7/7.  Etiology appears unclear at this time but not suspected to be related to her history of ANCA associated GPA based on recent rituximab administration in May and urinalysis not suggestive of GN.  Will likely need renal biopsy next week for diagnostic/prognostic purposes.  Her hemodialysis catheter has been functioning poorly and she will require an exchange of this/conversion to [facility] if additional dialysis is required. 2.  Anemia: Previously with some bleeding around her dialysis catheter and some transient hematochezia.  Status post PRBC transfusions and awaiting CT abdomen/pelvis to evaluate for intra-abdominal or retroperitoneal bleed in the setting of a current hemoglobin/hematocrit drops without overt loss. 3.  Altered mental status: Suspected to be multifactorial etiology with lumbar puncture deferred after improvement of mental status with dialysis and reduction of baclofen rate. 4.  Sepsis: Suspected to be of pneumonia/urinary tract infection etiology and status post completion of antibiotics. 5.  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  Unclear if still awaiting transfer to tertiary center. 6.  COVID-19 virus infection: Incidental positive COVID PCR found at admission.  Remains on corticosteroids and did not get remdesivir due to elevated LFTs.  Subjective:   Reports to be feeling better except for breakthrough episodes of chronic pain.  Son at bedside reports mental status is better.   Objective:   BP (!) 152/80   Pulse (!) 59   Temp 98 F (36.7 C) (Oral)   Resp 20   Ht 5\' 5"  (1.651 m)   Wt 100.7 kg   SpO2 94%   BMI 36.94 kg/m    Intake/Output Summary (Last 24 hours) at 04/30/2022 0810 Last data filed at 04/30/2022 07/01/2022 Gross per 24 hour  Intake 1085 ml  Output 850 ml  Net 235 ml   Weight change:   Physical Exam: Gen: Appears comfortable resting in bed, son sitting in recliner at bedside CVS: Pulse regular rhythm, normal rate, S1 and S2 normal Resp: Transmitted breath sounds bilaterally without rales/rhonchi Abd: Soft, obese, nontender, bowel sounds normal Ext: Trace lower extremity edema.  Bilateral forefoot amputations noted  Imaging: No results found.  Labs: BMET Recent Labs  Lab 04/24/22 0816 04/25/22 0022 04/26/22 0350 04/27/22 0215 04/28/22 0400 04/29/22 0455 04/30/22 0145  NA 127* 134* 136 135 138 136 137  K 5.1 4.0 4.0 4.0 3.8 3.7 4.3  CL 90* 94* 94* 100 97* 98 96*  CO2 17* 24 24 27 25 27 25   GLUCOSE 81 100* 84 149* 160* 136* 162*  BUN 101* 62* 64* 34* 49* 36* 49*  CREATININE 8.35* 5.20* 5.45* 3.53* 4.30* 3.11* 3.69*  CALCIUM 6.3* 7.0* 6.9* 7.0* 7.4* 7.4* 7.6*  PHOS 11.2* 6.5* 7.4* 5.4* 6.2* 4.9* 5.8*   CBC Recent Labs  Lab 04/28/22 0400 04/28/22 0520 04/28/22 1750 04/29/22 0455 04/30/22 0145  WBC 22.9* 19.5*  --  22.1* 26.9*  NEUTROABS  --   --   --  19.0* 25.0*  HGB 5.6* 6.3* 7.5* 6.0* 6.6*  HCT 16.5* 18.6* 21.5* 17.6* 19.0*  MCV 84.2 83.4  --  84.6 79.5*  PLT 114* 106*  --  163 177    Medications:     sodium chloride  Intravenous Once   sodium chloride   Intravenous Once   Chlorhexidine Gluconate Cloth  6 each Topical Daily   DULoxetine  30 mg Oral Daily   folic acid  2 mg Oral Daily   guaiFENesin-dextromethorphan  10 mL Oral Q12H   insulin aspart  0-9 Units Subcutaneous TID WC   insulin glargine-yfgn  8 Units Subcutaneous QHS   linaclotide  145 mcg Oral QAC breakfast   pantoprazole  40 mg Oral Daily   predniSONE  15 mg Oral Q supper   predniSONE  20 mg Oral Q breakfast   senna  2 tablet Oral BID   vitamin B-12  1,000 mcg Oral Daily    Zetta Bills,  MD 04/30/2022, 8:10 AM

## 2022-04-30 NOTE — Plan of Care (Signed)
  Problem: Clinical Measurements: Goal: Ability to maintain clinical measurements within normal limits will improve Outcome: Not Progressing   Problem: Clinical Measurements: Goal: Will remain free from infection Outcome: Not Progressing   Problem: Skin Integrity: Goal: Risk for impaired skin integrity will decrease Outcome: Not Progressing

## 2022-05-01 DIAGNOSIS — G9341 Metabolic encephalopathy: Secondary | ICD-10-CM | POA: Diagnosis not present

## 2022-05-01 LAB — RENAL FUNCTION PANEL
Albumin: 1.8 g/dL — ABNORMAL LOW (ref 3.5–5.0)
Anion gap: 12 (ref 5–15)
BUN: 62 mg/dL — ABNORMAL HIGH (ref 6–20)
CO2: 25 mmol/L (ref 22–32)
Calcium: 7.3 mg/dL — ABNORMAL LOW (ref 8.9–10.3)
Chloride: 97 mmol/L — ABNORMAL LOW (ref 98–111)
Creatinine, Ser: 4.02 mg/dL — ABNORMAL HIGH (ref 0.44–1.00)
GFR, Estimated: 12 mL/min — ABNORMAL LOW (ref >60)
Glucose, Bld: 133 mg/dL — ABNORMAL HIGH (ref 70–99)
Phosphorus: 6.2 mg/dL — ABNORMAL HIGH (ref 2.5–4.6)
Potassium: 4 mmol/L (ref 3.5–5.1)
Sodium: 134 mmol/L — ABNORMAL LOW (ref 135–145)

## 2022-05-01 LAB — CBC WITH DIFFERENTIAL/PLATELET
Abs Immature Granulocytes: 0 10*3/uL (ref 0.00–0.07)
Abs Immature Granulocytes: 1.47 10*3/uL — ABNORMAL HIGH (ref 0.00–0.07)
Basophils Absolute: 0 10*3/uL (ref 0.0–0.1)
Basophils Absolute: 0.1 10*3/uL (ref 0.0–0.1)
Basophils Relative: 0 %
Basophils Relative: 0 %
Eosinophils Absolute: 0 10*3/uL (ref 0.0–0.5)
Eosinophils Absolute: 0 10*3/uL (ref 0.0–0.5)
Eosinophils Relative: 0 %
Eosinophils Relative: 0 %
HCT: 20.5 % — ABNORMAL LOW (ref 36.0–46.0)
HCT: 25.8 % — ABNORMAL LOW (ref 36.0–46.0)
Hemoglobin: 7 g/dL — ABNORMAL LOW (ref 12.0–15.0)
Hemoglobin: 8.9 g/dL — ABNORMAL LOW (ref 12.0–15.0)
Immature Granulocytes: 7 %
Lymphocytes Relative: 4 %
Lymphocytes Relative: 6 %
Lymphs Abs: 0.8 10*3/uL (ref 0.7–4.0)
Lymphs Abs: 1.2 10*3/uL (ref 0.7–4.0)
MCH: 28.1 pg (ref 26.0–34.0)
MCH: 28.6 pg (ref 26.0–34.0)
MCHC: 34.1 g/dL (ref 30.0–36.0)
MCHC: 34.5 g/dL (ref 30.0–36.0)
MCV: 82.3 fL (ref 80.0–100.0)
MCV: 83 fL (ref 80.0–100.0)
Monocytes Absolute: 0.6 10*3/uL (ref 0.1–1.0)
Monocytes Absolute: 0.9 10*3/uL (ref 0.1–1.0)
Monocytes Relative: 3 %
Monocytes Relative: 5 %
Neutro Abs: 17.9 10*3/uL — ABNORMAL HIGH (ref 1.7–7.7)
Neutro Abs: 17.4 10*3/uL — ABNORMAL HIGH (ref 1.7–7.7)
Neutrophils Relative %: 93 %
Neutrophils Relative %: 82 %
Platelets: 213 10*3/uL (ref 150–400)
Platelets: 214 10*3/uL (ref 150–400)
RBC: 2.49 MIL/uL — ABNORMAL LOW (ref 3.87–5.11)
RBC: 3.11 MIL/uL — ABNORMAL LOW (ref 3.87–5.11)
RDW: 18 % — ABNORMAL HIGH (ref 11.5–15.5)
RDW: 17.6 % — ABNORMAL HIGH (ref 11.5–15.5)
WBC: 19.3 10*3/uL — ABNORMAL HIGH (ref 4.0–10.5)
WBC: 21.1 10*3/uL — ABNORMAL HIGH (ref 4.0–10.5)
nRBC: 0 % (ref 0.0–0.2)
nRBC: 0 /100 WBC
nRBC: 0 % (ref 0.0–0.2)

## 2022-05-01 LAB — IMMUNOFIXATION ELECTROPHORESIS
IgA: 62 mg/dL — ABNORMAL LOW (ref 87–352)
IgG (Immunoglobin G), Serum: 267 mg/dL — ABNORMAL LOW (ref 586–1602)
IgM (Immunoglobulin M), Srm: 10 mg/dL — ABNORMAL LOW (ref 26–217)
Total Protein ELP: 4.4 g/dL — ABNORMAL LOW (ref 6.0–8.5)

## 2022-05-01 LAB — TYPE AND SCREEN
ABO/RH(D): O POS
Antibody Screen: NEGATIVE
Unit division: 0
Unit division: 0
Unit division: 0
Unit division: 0
Unit division: 0

## 2022-05-01 LAB — BPAM RBC
Blood Product Expiration Date: 202308012359
Blood Product Expiration Date: 202308052359
Blood Product Expiration Date: 202308072359
Blood Product Expiration Date: 202308072359
Blood Product Expiration Date: 202308102359
ISSUE DATE / TIME: 202307060554
ISSUE DATE / TIME: 202307061423
ISSUE DATE / TIME: 202307070650
ISSUE DATE / TIME: 202307080607
ISSUE DATE / TIME: 202307090330
Unit Type and Rh: 5100
Unit Type and Rh: 5100
Unit Type and Rh: 5100
Unit Type and Rh: 5100
Unit Type and Rh: 5100

## 2022-05-01 LAB — GLUCOSE, CAPILLARY
Glucose-Capillary: 133 mg/dL — ABNORMAL HIGH (ref 70–99)
Glucose-Capillary: 105 mg/dL — ABNORMAL HIGH (ref 70–99)
Glucose-Capillary: 170 mg/dL — ABNORMAL HIGH (ref 70–99)
Glucose-Capillary: 223 mg/dL — ABNORMAL HIGH (ref 70–99)

## 2022-05-01 LAB — PATHOLOGIST SMEAR REVIEW

## 2022-05-01 LAB — PREPARE RBC (CROSSMATCH)

## 2022-05-01 LAB — CREATININE, SERUM
Creatinine, Ser: 4.11 mg/dL — ABNORMAL HIGH (ref 0.44–1.00)
GFR, Estimated: 12 mL/min — ABNORMAL LOW (ref >60)

## 2022-05-01 MED ORDER — SODIUM CHLORIDE 0.9% IV SOLUTION: Freq: Once | INTRAVENOUS | Status: AC

## 2022-05-01 MED ORDER — HYDRALAZINE HCL 20 MG/ML IJ SOLN
10.0000 mg | Freq: Four times a day (QID) | INTRAMUSCULAR | Status: DC | PRN
  Administered 2022-05-01 (×2): 10 mg via INTRAVENOUS
  Filled 2022-05-01 (×2): qty 1

## 2022-05-01 NOTE — Progress Notes (Signed)
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   Patient presents with worsening intermittent altered mental status and increased weakness.  She has been declining since approximately January, 6 months ago per husband.  She was recently admitted to our facility and treated for pneumonia with moderate improvement.  She was to continue on vancomycin through 04/08/2022 followed by infectious disease.  Bronchoscopy during that admission on 03/30/2022 shows Candida in the setting of ongoing antibiotics and urine culture ultimately grew yeast - deemed to be colonized and not active infection given fungitel was reassuringly low per ID discussion. Of note she is also had a recent diagnosis of pulmonary embolism on apixaban.   Patient admitted this hospitalization (on 04/17/22) with elevated white count, lactic acid, and acute renal failure in the setting of poor p.o. intake altered mental status tachycardia and tachypnea concerning for sepsis. Patient's infectious source was presumed to be pneumonia versus UTI - placed initially on broad-spectrum antibiotics of vancomycin and cefepime. Her sepsis criteria did not improve and her renal function continued to worsen in the setting of presumed sepsis and rhabdomyolysis (ATN). Nephrology following with worsening mental status, tremors/muscle spasms with metabolic derangements. Patient transferred to main campus for neurology evaluation, EEG ultimately unremarkable for seizures-her movement was considered to be musculoskeletal, somewhat choreiform in nature, holding off on LP per neuro given improvements with HD and intrathecal pump turned down (unable to be turned  off but can be turned to [facility]).  Assessment & Plan:   Principal Problem:   Acute metabolic encephalopathy Active Problems:   Acute cystitis without hematuria   Current chronic use of systemic steroids   Chronic pain   Secondary adrenal insufficiency (HCC)   AKI (acute kidney injury) (HCC)   Granulomatosis with polyangiitis (HCC)   GERD (gastroesophageal reflux disease)   Essential hypertension   Obesity (BMI 30-39.9)   COVID-19 virus infection   Severe sepsis (HCC)   Pulmonary embolism (HCC)   Transaminitis   Rhabdomyolysis  Acute metabolic encephalopathy, POA, multifactorial - Initially presumed to be secondary to sepsis/renal failure/hyponatremia  - Potential covid encephalitis, however spastic choreiform movements are not consistent with COVID encephalitis - MRI unremarkable for acute process  - EEG unremarkable for seizure - Discontinued all home CNS depressant medications - continued Cymbalta only - Mental status improving after initiating dialysis and discontinuation of intrathecal pain pump - Discussed with neurology.  Holding off on LP and CTA head at this time with improvement.  Neurology signed off 7/6   Acute blood loss anemia on anemia of chronic kidney disease, history of chronic iron deficiency anemia: Noted some bleeding from her dialysis catheter as well as minimal rectal bleeding with history of hemorrhoids and fissure - Transfused 2 unit packed red blood cell 7/6, 1 unit 7/7 and 1 unit on 04/29/2022 as well as 04/30/2022, hemoglobin better but is still 7.0, will transfuse 1 more unit today.  CT abdomen and pelvis on 04/30/2022 shows right gluteus minimus and piriformis hematoma.  General surgery was consulted however per them, no indication for any surgery at the moment and they recommended continuing to transfuse as needed.  Please note following: - Followed by [facility] with history of chronic normocytic anemia as well as iron deficiency anemia with hemoglobin  ranging from 8.9-13.6.  Has been getting outpatient iron infusion biweekly. - Followed by [facility].  Had normal colonoscopy 2014, flex sigmoidoscopy 2020 for rectal bleeding which revealed deep anal fissure and external hemorrhoids.  Underwent Botox, then lateral internal anal sphincterotomy.  EGD November 2022 was normal.  Colonoscopy revealed multiple diverticula, internal hemorrhoids, 3 polyps. Suspect this is likely due to either hemorrhoids/fissure/diverticula in setting of chronic iron deficiency and anemia of chronic disease.    Polypharmacy risk - Home medications include bupivacaine/zinconotide intrathecal baclofen pump, duloxetine, gabapentin, methocarbamol, nucynta, oxycodone, Topamax.   Chronic pain, neuropathy, and chronic opioid dependence  - Patient has intrathecal pump with bupivacaine and ziconotide - turned down to lowest setting 7/3 (cannot stop completely per tech) - Consider restarting low dose PO oxy vs nucynta    AKI/ATN currently requiring HD - Complicated and multifactorial etiology, possibly ATN per nephrology - Temp HD catheter initially placed 04/24/2022 - issues with bleeding, but no more.  Last hemodialysis 04/28/2022.  Nephrology wanted to watch over the weekend however creatinine has been rising.  Will defer to nephrology.   Granulomatosis with polyangiitis  - Patient receiving rituximab and methotrexate as an outpatient ([facility]) - [facility] (Dr. Dimple Caseyice) - Nephrology sent off multiple biomarkers: *Of note C4, and Kappa light chains* were elevated minimally but otherwise ANCA, ANA, RF, anti-dsDNA, c3, spep/sflc without abnormalities, hepatitis panel, HIV, EBV, CMV were unremarkable - Cryos still pending  - Continue prednisone 20mg  qAM and 15mg  qPM - continue to wean to baseline level (5mg  daily)    Secondary adrenal insufficiency  -Continue steroids    Current chronic use of systemic steroids -On 5 mg  daily at baseline.  Continue to wean   Acute hypoxic respiratory failure, POA -Multifactorial in the setting of metabolic encephalopathy, presumed pneumonia, granulomatosis/polyangiitis, concurrent COVID-19 infection -Potentially exacerbated by hypervolemia surrounding renal failure -Resolved and remains on room air   Presumed severe sepsis secondary to pneumonia versus UTI -Completed 5 days of antibiotic therapy   C. difficile colonization -C. difficile toxin is negative.  No indication of treatment at this time   GERD  -PPI    Rhabdomyolysis -Secondary to prolonged downtime -Improved   Elevated transaminitis -Most likely in the setting of sepsis/shock liver and rhabdomyolysis. -Improved   History of pulmonary embolism  -Lovenox on hold due to recurrent anemia requiring blood transfusion.   COVID-19 virus infection, questionably incidental - COVID PCR incidentally positive at time of admission, without respiratory symptoms - Remdesivir withheld due to elevated liver enzymes - Completed 10-day quarantine   Diabetes mellitus -A1c 8.6 -Blood sugar controlled, continue current dose of Semglee, sliding scale insulin   Obesity  Estimated body mass index is 38.37 kg/m as calculated from the following:   Height as of this encounter: 5\' 5"  (1.651 m).   Weight as of this encounter: 104.6 kg.  DVT prophylaxis: Place and maintain sequential compression device Start: 04/28/22 1251   Code Status: Full Code  Family Communication: None present at bedside.  Plan of care discussed with patient in length and he/she verbalized understanding and agreed with it.  Status is: Inpatient Remains inpatient appropriate because: Recurrent bleeding requiring blood transfusion and worsening creatinine.   Estimated body mass index is 36.94 kg/m as calculated from the following:   Height as of this encounter: 5\' 5"  (1.651 m).   Weight as of this encounter: 100.7 kg.    Nutritional  Assessment: Body mass  index is 36.94 kg/m.Marland Kitchen Seen by dietician.  I agree with the assessment and plan as outlined below: Nutrition Status . Skin Assessment: I have examined the patient's skin and I agree with the wound assessment as performed by the wound care RN as outlined below:    Consultants:  Nephrology Neurology-signed off General surgery  Procedures:  As above  Antimicrobials:  Anti-infectives (From admission, onward)    Start     Dose/Rate Route Frequency Ordered Stop   04/21/22 2200  meropenem (MERREM) 500 mg in sodium chloride 0.9 % 100 mL IVPB  Status:  Discontinued        500 mg 200 mL/hr over 30 Minutes Intravenous Every 12 hours 04/21/22 1507 04/22/22 1945   04/21/22 1000  fluconazole (DIFLUCAN) tablet 100 mg  Status:  Discontinued        100 mg Oral Daily 04/20/22 0952 04/22/22 1945   04/20/22 1500  meropenem (MERREM) 1 g in sodium chloride 0.9 % 100 mL IVPB  Status:  Discontinued        1 g 200 mL/hr over 30 Minutes Intravenous Every 12 hours 04/20/22 1408 04/21/22 1507   04/20/22 1045  fluconazole (DIFLUCAN) tablet 200 mg        200 mg Oral  Once 04/20/22 0952 04/20/22 1207   04/20/22 1021  ceFEPIme (MAXIPIME) 2 g in sodium chloride 0.9 % 100 mL IVPB  Status:  Discontinued        2 g 200 mL/hr over 30 Minutes Intravenous Every 24 hours 04/19/22 1355 04/20/22 1334   04/19/22 1100  linezolid (ZYVOX) IVPB 600 mg  Status:  Discontinued        600 mg 300 mL/hr over 60 Minutes Intravenous Every 12 hours 04/19/22 1014 04/22/22 1945   04/18/22 1200  vancomycin (VANCOREADY) IVPB 750 mg/150 mL  Status:  Discontinued        750 mg 150 mL/hr over 60 Minutes Intravenous Every 24 hours 04/17/22 1306 04/19/22 1014   04/17/22 2200  ceFEPIme (MAXIPIME) 2 g in sodium chloride 0.9 % 100 mL IVPB  Status:  Discontinued        2 g 200 mL/hr over 30 Minutes Intravenous Every 12 hours 04/17/22 1306 04/19/22 1355   04/17/22 1300  vancomycin (VANCOREADY) IVPB 2000 mg/400 mL         2,000 mg 200 mL/hr over 120 Minutes Intravenous  Once 04/17/22 1208 04/17/22 1729   04/17/22 1200  ceFEPIme (MAXIPIME) 2 g in sodium chloride 0.9 % 100 mL IVPB        2 g 200 mL/hr over 30 Minutes Intravenous  Once 04/17/22 1153 04/17/22 1330   04/17/22 1200  metroNIDAZOLE (FLAGYL) IVPB 500 mg        500 mg 100 mL/hr over 60 Minutes Intravenous  Once 04/17/22 1153 04/17/22 1435   04/17/22 1200  vancomycin (VANCOCIN) IVPB 1000 mg/200 mL premix  Status:  Discontinued        1,000 mg 200 mL/hr over 60 Minutes Intravenous  Once 04/17/22 1153 04/17/22 1208         Subjective:  Patient seen and examined.  She has no complaints.  She remains alert and oriented.  Objective: Vitals:   05/01/22 0832 05/01/22 0833 05/01/22 0849 05/01/22 1203  BP: (!) 159/84 (!) 159/84 (!) 159/82 (!) 173/88  Pulse: 71 72 73   Resp: 17 18 16 19   Temp:  98.6 F (37 C) 98.5 F (36.9 C)   TempSrc:  Oral Oral   SpO2:  100%  100%   Weight:      Height:        Intake/Output Summary (Last 24 hours) at 05/01/2022 1211 Last data filed at 05/01/2022 0649 Gross per 24 hour  Intake --  Output 1000 ml  Net -1000 ml    Filed Weights   04/26/22 1402 04/26/22 1914 04/28/22 1645  Weight: 105.2 kg 105.2 kg 100.7 kg    Examination:  General exam: Appears calm and comfortable  Respiratory system: Clear to auscultation. Respiratory effort normal. Cardiovascular system: S1 & S2 heard, RRR. No JVD, murmurs, rubs, gallops or clicks.  +2 pitting edema bilateral lower extremity. Gastrointestinal system: Abdomen is nondistended, soft and nontender. No organomegaly or masses felt. Normal bowel sounds heard. Central nervous system: Alert and oriented. No focal neurological deficits. Extremities: Symmetric 5 x 5 power. Skin: No rashes, lesions or ulcers.  Psychiatry: Judgement and insight appear normal. Mood & affect appropriate.   Data Reviewed: I have personally reviewed following labs and imaging  studies  CBC: Recent Labs  Lab 04/28/22 0400 04/28/22 0520 04/28/22 1750 04/29/22 0455 04/30/22 0145 04/30/22 0907 04/30/22 1859 05/01/22 0630  WBC 22.9* 19.5*  --  22.1* 26.9*  --   --  19.3*  NEUTROABS  --   --   --  19.0* 25.0*  --   --  17.9*  HGB 5.6* 6.3*   < > 6.0* 6.6* 7.1* 7.2* 7.0*  HCT 16.5* 18.6*   < > 17.6* 19.0* 20.8* 20.7* 20.5*  MCV 84.2 83.4  --  84.6 79.5*  --   --  82.3  PLT 114* 106*  --  163 177  --   --  213   < > = values in this interval not displayed.    Basic Metabolic Panel: Recent Labs  Lab 04/27/22 0215 04/28/22 0400 04/29/22 0455 04/30/22 0145 05/01/22 0630  NA 135 138 136 137 134*  K 4.0 3.8 3.7 4.3 4.0  CL 100 97* 98 96* 97*  CO2 27 25 27 25 25   GLUCOSE 149* 160* 136* 162* 133*  BUN 34* 49* 36* 49* 62*  CREATININE 3.53* 4.30* 3.11* 3.69* 4.02*  4.11*  CALCIUM 7.0* 7.4* 7.4* 7.6* 7.3*  PHOS 5.4* 6.2* 4.9* 5.8* 6.2*    GFR: Estimated Creatinine Clearance: 18 mL/min (A) (by C-G formula based on SCr of 4.11 mg/dL (H)). Liver Function Tests: Recent Labs  Lab 04/25/22 0022 04/26/22 0350 04/27/22 0215 04/28/22 0400 04/29/22 0455 04/30/22 0145 05/01/22 0630  AST 95* 72* 54* 52*  --   --   --   ALT 77* 54* 40 34  --   --   --   ALKPHOS 67 56 54 44  --   --   --   BILITOT 0.7 1.1 1.0 1.1  --   --   --   PROT 5.0* 4.4* 4.1* 4.1*  --   --   --   ALBUMIN 1.9*  1.9* 1.8*  1.8* 1.6*  1.6* 1.7*  1.7* 1.7* 1.8* 1.8*    No results for input(s): "LIPASE", "AMYLASE" in the last 168 hours. No results for input(s): "AMMONIA" in the last 168 hours. Coagulation Profile: No results for input(s): "INR", "PROTIME" in the last 168 hours. Cardiac Enzymes: Recent Labs  Lab 04/25/22 0022 04/26/22 0350 04/27/22 0215 04/28/22 0400  CKTOTAL 3,558* 2,171* 1,035* 1,772*    BNP (last 3 results) No results for input(s): "PROBNP" in the last 8760 hours. HbA1C: No results for input(s): "HGBA1C" in  the last 72 hours. CBG: Recent Labs  Lab  04/30/22 0606 04/30/22 1641 04/30/22 2200 05/01/22 0625 05/01/22 1154  GLUCAP 145* 147* 185* 133* 105*    Lipid Profile: No results for input(s): "CHOL", "HDL", "LDLCALC", "TRIG", "CHOLHDL", "LDLDIRECT" in the last 72 hours. Thyroid Function Tests: No results for input(s): "TSH", "T4TOTAL", "FREET4", "T3FREE", "THYROIDAB" in the last 72 hours. Anemia Panel: Recent Labs    04/28/22 1232 04/29/22 1400  TIBC  --  213*  IRON  --  26*  RETICCTPCT 1.7  --     Sepsis Labs: No results for input(s): "PROCALCITON", "LATICACIDVEN" in the last 168 hours.  Recent Results (from the past 240 hour(s))  Gastrointestinal Panel by PCR , Stool     Status: None   Collection Time: 04/23/22  3:55 PM   Specimen: Stool  Result Value Ref Range Status   Campylobacter species NOT DETECTED NOT DETECTED Final   Plesimonas shigelloides NOT DETECTED NOT DETECTED Final   Salmonella species NOT DETECTED NOT DETECTED Final   Yersinia enterocolitica NOT DETECTED NOT DETECTED Final   Vibrio species NOT DETECTED NOT DETECTED Final   Vibrio cholerae NOT DETECTED NOT DETECTED Final   Enteroaggregative E coli (EAEC) NOT DETECTED NOT DETECTED Final   Enteropathogenic E coli (EPEC) NOT DETECTED NOT DETECTED Final   Enterotoxigenic E coli (ETEC) NOT DETECTED NOT DETECTED Final   Shiga like toxin producing E coli (STEC) NOT DETECTED NOT DETECTED Final   Shigella/Enteroinvasive E coli (EIEC) NOT DETECTED NOT DETECTED Final   Cryptosporidium NOT DETECTED NOT DETECTED Final   Cyclospora cayetanensis NOT DETECTED NOT DETECTED Final   Entamoeba histolytica NOT DETECTED NOT DETECTED Final   Giardia lamblia NOT DETECTED NOT DETECTED Final   Adenovirus F40/41 NOT DETECTED NOT DETECTED Final   Astrovirus NOT DETECTED NOT DETECTED Final   Norovirus GI/GII NOT DETECTED NOT DETECTED Final   Rotavirus A NOT DETECTED NOT DETECTED Final   Sapovirus (I, II, IV, and V) NOT DETECTED NOT DETECTED Final    Comment: Performed at  [facility], [address] [place], [place] [address]  C Difficile Quick Screen (NO PCR Reflex)     Status: Abnormal   Collection Time: 04/23/22  3:57 PM   Specimen: Stool  Result Value Ref Range Status   C Diff antigen POSITIVE (A) NEGATIVE Final   C Diff toxin NEGATIVE NEGATIVE Final   C Diff interpretation   Final    Results are indeterminate. Please contact the provider listed for your campus for C diff questions in AMION.    Comment: Performed at [facility], [address] [place], [place] [address]     Radiology Studies: CT ABDOMEN PELVIS WO CONTRAST  Result Date: 04/30/2022 CLINICAL DATA:  Retroperitoneal hematoma. EXAM: CT ABDOMEN AND PELVIS WITHOUT CONTRAST TECHNIQUE: Multidetector CT imaging of the abdomen and pelvis was performed following the standard protocol without IV contrast. RADIATION DOSE REDUCTION: This exam was performed according to the departmental dose-optimization program which includes automated exposure control, adjustment of the mA and/or kV according to patient size and/or use of iterative reconstruction technique. COMPARISON:  Renal ultrasound 04/18/2022. CT chest abdomen and pelvis 07/09/2016. FINDINGS: Lower chest: There is some atelectasis in the bilateral lung bases. There is a trace right pleural effusion. Hepatobiliary: No focal liver abnormality is seen. Status post cholecystectomy. No biliary dilatation. Pancreas: Unremarkable. No pancreatic ductal dilatation or surrounding inflammatory changes. Spleen: Normal in size without focal abnormality. Adrenals/Urinary Tract: The kidneys and adrenal glands  are within normal limits. The bladder is completely decompressed by Foley catheter. Stomach/Bowel: Stomach is within normal limits. No evidence of bowel wall thickening, distention, or inflammatory changes. The appendix is not seen. There is descending and sigmoid colon diverticulosis. Rectal tube/catheter in place. Vascular/Lymphatic: Aortic  atherosclerosis. No enlarged abdominal or pelvic lymph nodes. Reproductive: Status post hysterectomy. No adnexal masses. Other: Presacral edema is present. There is no ascites. There is no retroperitoneal hematoma. There is diffuse body wall edema. No focal abdominal wall hernia. Thoracic catheter/stimulator device is present with generator in the left anterior abdominal wall, unchanged. Musculoskeletal: No acute osseous findings. There is heterogeneous hyperdensity expanding the right gluteus minimus and piriformis musculature measuring 7.4 x 13.8 x 14.1 cm in largest dimensions worrisome for intramuscular hematoma. IMPRESSION: 1. Heterogeneous hyperdensity expanding the right gluteus minimus and piriformis musculature worrisome for intramuscular hematoma. 2. No retroperitoneal hematoma. 3. Diffuse body wall edema and presacral edema. 4. Trace right pleural effusion with bibasilar atelectasis. 5. Colonic diverticulosis without evidence for acute diverticulitis. 6. Foley catheter. 7.  Aortic Atherosclerosis (ICD10-I70.0). Electronically Signed   By: Darliss Cheney M.D.   On: 04/30/2022 16:29    Scheduled Meds:  Chlorhexidine Gluconate Cloth  6 each Topical Daily   DULoxetine  30 mg Oral Daily   folic acid  2 mg Oral Daily   guaiFENesin-dextromethorphan  10 mL Oral Q12H   insulin aspart  0-9 Units Subcutaneous TID WC   insulin glargine-yfgn  8 Units Subcutaneous QHS   linaclotide  145 mcg Oral QAC breakfast   pantoprazole  40 mg Oral Daily   predniSONE  15 mg Oral Q supper   predniSONE  20 mg Oral Q breakfast   senna  2 tablet Oral BID   vitamin B-12  1,000 mcg Oral Daily   Continuous Infusions:    LOS: 14 days   Hughie Closs, MD [facility]  05/01/2022, 12:11 PM   *Please note that this is a verbal dictation therefore any spelling or grammatical errors are due to the "[employer]agon [employer] One" system interpretation.  Please page via Amion and do not message via secure chat for urgent  patient care matters. Secure chat can be used for non urgent patient care matters.  How to contact the [facility] Attending or Consulting provider 7A - 7P or covering provider during after hours 7P -7A, for this patient?  Check the care team in [facility] and look for a) attending/consulting [facility] provider listed and b) the [facility] team listed. Page or secure chat 7A-7P. Log into [email or web address] and use [facility]'s universal password to access. If you do not have the password, please contact the hospital operator. Locate the [facility] provider you are looking for under [facility] and page to a number that you can be directly reached. If you still have difficulty reaching the provider, please page the [facility] (Director on Call) for the Hospitalists listed on amion for assistance.

## 2022-05-01 NOTE — Progress Notes (Signed)
Speech Language Pathology Treatment: Dysphagia  Patient Details Name: [name] MRN: [id number] DOB: 1966/03/06 Today's Date: 05/01/2022 Time: 1000-1025 SLP Time Calculation (min) (ACUTE ONLY): 25 min  Assessment / Plan / Recommendation Clinical Impression  Pt seen for dysphagia tx/f/u visit post MBS with education provided to pt/son re: dysphagia symptoms and precautions placed to A with PO intake safety.  Pt with solemn, flat affect initially and aphonic/hypophonic vocal quality when limited speaking initiated.  Pt refused oral care during session, but nursing provided earlier in morning.  Pt consumed ice chips (one by one), thin via tsp (1/2 tsp amounts) with delayed cough noted.  Initial congested cough noted prior to intake, but this improved during session with min verbal cues by SLP and implementation of repetitive dry swallows and effortful swallow intermittently, although pt exhibited generalized oral weakness throughout session.  Swallow initiation slightly delayed with intake, but improved with repetitive swallows and cueing from SLP.  Oral clearance adequate/mastication efforts with soft consistency and puree, but limited amounts consumed d/t decreased satiety/fatigue.  Recommend continue current diet of Dysphagia 3/nectar-thickened liquids d/t MBS results and overall deconditioning.  ST will f/u for diet tolerance and potential for upgrade if able.       HPI HPI: 56 y.o. female with medical history significant of Wegener's angiitis, chronic opioid dependency, hyperlipidemia, hypertension, impaired glucose tolerance, adrenal insufficiency on chronic prednisone usage, rheumatoid arthritis and multiple admissions secondary to sepsis in the setting of pneumonia and UTI.  Presented to the hospital [facility]) on 04/17/22 with complaints of intermittent altered mental status changes at home and increased weakness.  Symptoms have been present for the last 2-3 days and worsening according to  patient's husband.  Patient has just completed her antibiotic therapy from most recent admission about 1 week prior to presentation.  In the ED patient met criteria for severe sepsis with elevated WBCs, elevated lactic acid in the 4.7 range, acute renal failure as organ dysfunction and was also tachycardic and tachypneic;Subsequently found to be Covid+; transferred to [facility] on 04/22/22; ST evaluation on 04/21/22 indicating need for Dysphagia 2/honey-thickened liquids: CXR on 04/17/22 indicated Progressive interstitial pattern left lung and right upper lobe.  This may represent edema or infection; ST completed MBS on 04/28/22 with recommendation for Dysphagia 3/nectar-thickened liquids.  ST f/u for diet tolerance.      SLP Plan  Continue with current plan of care      Recommendations for follow up therapy are one component of a multi-disciplinary discharge planning process, led by the attending physician.  Recommendations may be updated based on patient status, additional functional criteria and insurance authorization.    Recommendations  Diet recommendations: Dysphagia 3 (mechanical soft);Nectar-thick liquid Liquids provided via: Teaspoon;Cup;No straw Medication Administration: Whole meds with puree Supervision: Full supervision/cueing for compensatory strategies;Staff to assist with self feeding Compensations: Slow rate;Small sips/bites;Clear throat intermittently;Effortful swallow                Oral Care Recommendations: Oral care BID;Staff/trained caregiver to provide oral care Follow Up Recommendations: Home health SLP (or family plan) Assistance recommended at discharge: Set up Supervision/Assistance SLP Visit Diagnosis: Dysphagia, oropharyngeal phase (R13.12) Plan: Continue with current plan of care           Elvina Sidle, M.S., CCC-SLP  05/01/2022, 11:23 AM

## 2022-05-01 NOTE — Progress Notes (Signed)
Physical Therapy Treatment Patient Details Name: [name] MRN: [id number] DOB: 1966-10-01 Today's Date: 05/01/2022   History of Present Illness Pt is a 56 y.o. female admitted 6/26 after being found by her husband on the floor at home with AMS. Admitted with dx of acute metabolic encephalopathy. Incidental COVID+ on admission. Initially admitted to [facility] and transferred to [facility] 7/1 for initiation of HD. Multiple recent hospitalizations, 4/3-5/4 and 6/4-6/14. PMH: PE, Wegener's disease, CKD 3, DM, HTN, chronic pain with an intrathecal pump, OSA, bilat mid-foot amputations, peripheral neuropathy    PT Comments    Pt supine in bed on arrival.  Pt limited due to pain in R LE.  Required modified tilting of the bed to achieve sitting upright.  Pt was able to move into supine holding to head board to pull while PTA used pad to boost.  Placed in chair position for lunch.  Continue to recommend rehab in a post acute setting.      Recommendations for follow up therapy are one component of a multi-disciplinary discharge planning process, led by the attending physician.  Recommendations may be updated based on patient status, additional functional criteria and insurance authorization.  Follow Up Recommendations  Skilled nursing-short term rehab (<3 hours/day)     Assistance Recommended at Discharge Frequent or constant Supervision/Assistance  Patient can return home with the following Two people to help with walking and/or transfers;Assistance with cooking/housework;Assist for transportation;Two people to help with bathing/dressing/bathroom;Help with stairs or ramp for entrance   Equipment Recommendations  Other (comment) (hoyer lift)    Recommendations for Other Services       Precautions / Restrictions Precautions Precautions: Fall;Other (comment) Precaution Comments: h/o bilateral transmet amputations, foley catheter, flexiseal, central line Restrictions Weight Bearing Restrictions: No      Mobility  Bed Mobility Overal bed mobility: Needs Assistance Bed Mobility: Supine to Sit, Sit to Supine     Supine to sit: Max assist, HOB elevated (bed tilted to improve ease of supine to sit transfer.)     General bed mobility comments: Max due to weakness, pain and difficutly following commands  Pt required assistance to move B LEs to egde of bed, unable to move RLE completely due to increased pain so only LLE flexed over side of bed sitting diagonal to corner of bed.  Once in sitting performed static balance and UE exercises.    Transfers                        Ambulation/Gait                   Stairs             Wheelchair Mobility    Modified Rankin (Stroke Patients Only)       Balance Overall balance assessment: Needs assistance   Sitting balance-Leahy Scale: Poor Sitting balance - Comments: Posterior lean.  Pt required cues for forward weight shifting with inetrmittent min to mod support.                                    Cognition Arousal/Alertness: Awake/alert Behavior During Therapy: Flat affect Overall Cognitive Status: Difficult to assess                                 General Comments: Pt minimally verbally  responsive and slow to respond, she did follow commands and was able to whisper her name at end of session.        Exercises General Exercises - Upper Extremity Shoulder Flexion: AAROM, Both, 5 reps, Seated Shoulder Extension: AAROM, Both, 5 reps, Seated Elbow Flexion: AROM, AAROM, Both, 10 reps, Seated Elbow Extension: AROM, AAROM, Both, 10 reps, Seated    General Comments        Pertinent Vitals/Pain Pain Assessment Pain Assessment: Faces Faces Pain Scale: Hurts whole lot Pain Location: R LE > abdomen Pain Descriptors / Indicators: Discomfort, Grimacing, Guarding Pain Intervention(s): Monitored during session, Repositioned    Home Living                           Prior Function            PT Goals (current goals can now be found in the care plan section) Acute Rehab PT Goals Patient Stated Goal: home Potential to Achieve Goals: Fair Progress towards PT goals: Progressing toward goals    Frequency    Min 3X/week      PT Plan Current plan remains appropriate    Co-evaluation              AM-PAC PT "6 Clicks" Mobility   Outcome Measure  Help needed turning from your back to your side while in a flat bed without using bedrails?: Total Help needed moving from lying on your back to sitting on the side of a flat bed without using bedrails?: Total Help needed moving to and from a bed to a chair (including a wheelchair)?: Total Help needed standing up from a chair using your arms (e.g., wheelchair or bedside chair)?: Total Help needed to walk in hospital room?: Total Help needed climbing 3-5 steps with a railing? : Total 6 Click Score: 6    End of Session Equipment Utilized During Treatment: Gait belt Activity Tolerance: Patient limited by pain Patient left: in bed;with call bell/phone within reach (in chair position to eat lunch) Nurse Communication: Mobility status PT Visit Diagnosis: Muscle weakness (generalized) (M62.81);Other abnormalities of gait and mobility (R26.89)     Time: 9937-1696 PT Time Calculation (min) (ACUTE ONLY): 27 min  Charges:  $Therapeutic Exercise: 8-22 mins $Therapeutic Activity: 8-22 mins                     Bonney Leitz , PTA Acute Rehabilitation Services Office [phone]    [name] Artis Delay 05/01/2022, 1:20 PM

## 2022-05-01 NOTE — Progress Notes (Signed)
Subjective: CC: Some soreness to right buttocks, no other complaints. Reports she is eating without n/v or abdominal pain.   Objective: Vital signs in last 24 hours: Temp:  [97.4 F (36.3 C)-98.1 F (36.7 C)] 98.1 F (36.7 C) (07/10 0404) Pulse Rate:  [68-87] 70 (07/10 0404) Resp:  [10-28] 15 (07/10 0404) BP: (114-165)/(69-82) 165/82 (07/10 0404) SpO2:  [97 %-100 %] 97 % (07/10 0404) Last BM Date : 04/29/22  Intake/Output from previous day: 07/09 0701 - 07/10 0700 In: 0  Out: 1000 [Urine:1000] Intake/Output this shift: No intake/output data recorded.  PE: Gen:  Alert, NAD, pleasant Card:  Reg Pulm: Rate and effort normal Abd: Soft, ND, NT, +BS, Truncal anasarca, superficial contusion type areas bilateral sides of her pannus without cellulitis  Ext:  Palpable hematoma deep right gluteal region without obvious skin changes. Bilateral transmet amp. MAE's  Lab Results:  Recent Labs    04/29/22 0455 04/30/22 0145 04/30/22 0907 04/30/22 1859  WBC 22.1* 26.9*  --   --   HGB 6.0* 6.6* 7.1* 7.2*  HCT 17.6* 19.0* 20.8* 20.7*  PLT 163 177  --   --    BMET Recent Labs    04/30/22 0145 05/01/22 0630  NA 137 134*  K 4.3 4.0  CL 96* 97*  CO2 25 25  GLUCOSE 162* 133*  BUN 49* 62*  CREATININE 3.69* 4.02*  4.11*  CALCIUM 7.6* 7.3*   PT/INR No results for input(s): "LABPROT", "INR" in the last 72 hours. CMP     Component Value Date/Time   NA 134 (L) 05/01/2022 0630   K 4.0 05/01/2022 0630   CL 97 (L) 05/01/2022 0630   CO2 25 05/01/2022 0630   GLUCOSE 133 (H) 05/01/2022 0630   BUN 62 (H) 05/01/2022 0630   CREATININE 4.11 (H) 05/01/2022 0630   CREATININE 4.02 (H) 05/01/2022 0630   CREATININE 1.07 (H) 07/01/2021 0909   CALCIUM 7.3 (L) 05/01/2022 0630   PROT 4.1 (L) 04/28/2022 0400   ALBUMIN 1.8 (L) 05/01/2022 0630   AST 52 (H) 04/28/2022 0400   ALT 34 04/28/2022 0400   ALKPHOS 44 04/28/2022 0400   BILITOT 1.1 04/28/2022 0400   GFRNONAA 12 (L)  05/01/2022 0630   GFRNONAA 12 (L) 05/01/2022 0630   GFRAA 30 (L) 01/12/2017 1208   Lipase     Component Value Date/Time   LIPASE 25 07/01/2021 0909    Studies/Results: CT ABDOMEN PELVIS WO CONTRAST  Result Date: 04/30/2022 CLINICAL DATA:  Retroperitoneal hematoma. EXAM: CT ABDOMEN AND PELVIS WITHOUT CONTRAST TECHNIQUE: Multidetector CT imaging of the abdomen and pelvis was performed following the standard protocol without IV contrast. RADIATION DOSE REDUCTION: This exam was performed according to the departmental dose-optimization program which includes automated exposure control, adjustment of the mA and/or kV according to patient size and/or use of iterative reconstruction technique. COMPARISON:  Renal ultrasound 04/18/2022. CT chest abdomen and pelvis 07/09/2016. FINDINGS: Lower chest: There is some atelectasis in the bilateral lung bases. There is a trace right pleural effusion. Hepatobiliary: No focal liver abnormality is seen. Status post cholecystectomy. No biliary dilatation. Pancreas: Unremarkable. No pancreatic ductal dilatation or surrounding inflammatory changes. Spleen: Normal in size without focal abnormality. Adrenals/Urinary Tract: The kidneys and adrenal glands are within normal limits. The bladder is completely decompressed by Foley catheter. Stomach/Bowel: Stomach is within normal limits. No evidence of bowel wall thickening, distention, or inflammatory changes. The appendix is not seen. There is descending and sigmoid colon diverticulosis. Rectal  tube/catheter in place. Vascular/Lymphatic: Aortic atherosclerosis. No enlarged abdominal or pelvic lymph nodes. Reproductive: Status post hysterectomy. No adnexal masses. Other: Presacral edema is present. There is no ascites. There is no retroperitoneal hematoma. There is diffuse body wall edema. No focal abdominal wall hernia. Thoracic catheter/stimulator device is present with generator in the left anterior abdominal wall, unchanged.  Musculoskeletal: No acute osseous findings. There is heterogeneous hyperdensity expanding the right gluteus minimus and piriformis musculature measuring 7.4 x 13.8 x 14.1 cm in largest dimensions worrisome for intramuscular hematoma. IMPRESSION: 1. Heterogeneous hyperdensity expanding the right gluteus minimus and piriformis musculature worrisome for intramuscular hematoma. 2. No retroperitoneal hematoma. 3. Diffuse body wall edema and presacral edema. 4. Trace right pleural effusion with bibasilar atelectasis. 5. Colonic diverticulosis without evidence for acute diverticulitis. 6. Foley catheter. 7.  Aortic Atherosclerosis (ICD10-I70.0). Electronically Signed   By: Darliss Cheney M.D.   On: 04/30/2022 16:29    Anti-infectives: Anti-infectives (From admission, onward)    Start     Dose/Rate Route Frequency Ordered Stop   04/21/22 2200  meropenem (MERREM) 500 mg in sodium chloride 0.9 % 100 mL IVPB  Status:  Discontinued        500 mg 200 mL/hr over 30 Minutes Intravenous Every 12 hours 04/21/22 1507 04/22/22 1945   04/21/22 1000  fluconazole (DIFLUCAN) tablet 100 mg  Status:  Discontinued        100 mg Oral Daily 04/20/22 0952 04/22/22 1945   04/20/22 1500  meropenem (MERREM) 1 g in sodium chloride 0.9 % 100 mL IVPB  Status:  Discontinued        1 g 200 mL/hr over 30 Minutes Intravenous Every 12 hours 04/20/22 1408 04/21/22 1507   04/20/22 1045  fluconazole (DIFLUCAN) tablet 200 mg        200 mg Oral  Once 04/20/22 0952 04/20/22 1207   04/20/22 1021  ceFEPIme (MAXIPIME) 2 g in sodium chloride 0.9 % 100 mL IVPB  Status:  Discontinued        2 g 200 mL/hr over 30 Minutes Intravenous Every 24 hours 04/19/22 1355 04/20/22 1334   04/19/22 1100  linezolid (ZYVOX) IVPB 600 mg  Status:  Discontinued        600 mg 300 mL/hr over 60 Minutes Intravenous Every 12 hours 04/19/22 1014 04/22/22 1945   04/18/22 1200  vancomycin (VANCOREADY) IVPB 750 mg/150 mL  Status:  Discontinued        750 mg 150 mL/hr  over 60 Minutes Intravenous Every 24 hours 04/17/22 1306 04/19/22 1014   04/17/22 2200  ceFEPIme (MAXIPIME) 2 g in sodium chloride 0.9 % 100 mL IVPB  Status:  Discontinued        2 g 200 mL/hr over 30 Minutes Intravenous Every 12 hours 04/17/22 1306 04/19/22 1355   04/17/22 1300  vancomycin (VANCOREADY) IVPB 2000 mg/400 mL        2,000 mg 200 mL/hr over 120 Minutes Intravenous  Once 04/17/22 1208 04/17/22 1729   04/17/22 1200  ceFEPIme (MAXIPIME) 2 g in sodium chloride 0.9 % 100 mL IVPB        2 g 200 mL/hr over 30 Minutes Intravenous  Once 04/17/22 1153 04/17/22 1330   04/17/22 1200  metroNIDAZOLE (FLAGYL) IVPB 500 mg        500 mg 100 mL/hr over 60 Minutes Intravenous  Once 04/17/22 1153 04/17/22 1435   04/17/22 1200  vancomycin (VANCOCIN) IVPB 1000 mg/200 mL premix  Status:  Discontinued  1,000 mg 200 mL/hr over 60 Minutes Intravenous  Once 04/17/22 1153 04/17/22 1208        Assessment/Plan Intramuscular hematoma involving the right gluteus minimus and piriformis muscles - Agree this likely occurred with some contusion in her gluteal muscle when she fell prior to presentation and has gradually formed over time. No need for acute surgical intervention.  I recommend avoiding anticoagulation and placement of an abdominal binder for some compression.   S/p 2U PRBC 7/6 and 1U PRBC 7/7, 7/8 and 7/9. Hgb pending this am. Stable at 7.2 yesterday pm from the am post transfusion draw. Vitals reassuring without tachycardia or hypotension. Not on pressors. If hgb stable this am, we will sign off. Agree with transfusing prn for hgb <7 or symptomatic anemia. Again no indication for surgery at this time.   FEN - Dys3 VTE - SCDs, on hold for anemia  ID - None currently, on prednisone  Foley - In place, per [facility]   LOS: 14 days    Jacinto Halim , [facility] Surgery 05/01/2022, 7:38 AM Please see Amion for pager number during day hours 7:00am-4:30pm

## 2022-05-01 NOTE — Progress Notes (Signed)
Patient ID: [name], female   DOB: 08/01/1966, 56 y.o.   MRN: [id number] [facility] Progress Note   Assessment/ Plan:   1. Acute kidney Injury: Nonoliguric overnight with rise of BUN and creatinine noted following her last hemodialysis on 7/7.  Etiology appears unclear at this time but not suspected to be related to her history of ANCA associated GPA based on recent rituximab administration in May and urinalysis not suggestive of GN but rather UTI.  Will repeat UA today to see if clear now.  May need renal biopsy  for diagnostic/prognostic purposes but if UA is bland I favor holding off on renal biopsy.  Her hemodialysis catheter has been functioning poorly and she will require an exchange of this/conversion to [facility] if additional dialysis is required --> will use for HD today (see below under AMS) but exchange if ongoing needs.  2.  Anemia: Previously with some bleeding around her dialysis catheter and some transient hematochezia.  Status post PRBC transfusions, CT 7/10 with gluteal hematoma for which surgery was consulted and is observing for now.  Hb in 7s today.  3.  Altered mental status: Suspected to be multifactorial etiology with lumbar puncture deferred after improvement of mental status with dialysis and reduction of baclofen rate.  MS again worse and we will undertake a trial of HD today to see if we can help; with BUN < 100 I'm not sure it will help but may.    4.  Sepsis: Suspected to be of pneumonia/urinary tract infection etiology and status post completion of antibiotics. 5.  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  Unclear if still awaiting transfer to tertiary center. 6.  COVID-19 virus infection: Incidental positive COVID PCR found at admission.  Remains on corticosteroids and did not get remdesivir due to elevated LFTs.  Subjective:   Son at bedside and notes mental  status, which had improved with HD last week, worsened over weekend.    Objective:   BP (!) 173/88   Pulse 73   Temp 98.5 F (36.9 C) (Oral)   Resp 19   Ht 5\' 5"  (1.651 m)   Wt 100.7 kg   SpO2 100%   BMI 36.94 kg/m   Intake/Output Summary (Last 24 hours) at 05/01/2022 1533 Last data filed at 05/01/2022 07/02/2022 Gross per 24 hour  Intake --  Output 1000 ml  Net -1000 ml    Weight change:   Physical Exam: Gen: Appears comfortable resting in bed, son standing at bedside CVS: Pulse regular rhythm, normal rate, S1 and S2 normal Resp: Transmitted breath sounds bilaterally without rales/rhonchi Abd: Soft, obese, nontender, bowel sounds normal Ext: Trace lower extremity edema.  Bilateral forefoot amputations noted  Imaging: CT ABDOMEN PELVIS WO CONTRAST  Result Date: 04/30/2022 CLINICAL DATA:  Retroperitoneal hematoma. EXAM: CT ABDOMEN AND PELVIS WITHOUT CONTRAST TECHNIQUE: Multidetector CT imaging of the abdomen and pelvis was performed following the standard protocol without IV contrast. RADIATION DOSE REDUCTION: This exam was performed according to the departmental dose-optimization program which includes automated exposure control, adjustment of the mA and/or kV according to patient size and/or use of iterative reconstruction technique. COMPARISON:  Renal ultrasound 04/18/2022. CT chest abdomen and pelvis 07/09/2016. FINDINGS: Lower chest: There is some atelectasis in the bilateral lung bases. There is a trace right pleural effusion. Hepatobiliary: No focal liver abnormality is seen. Status post cholecystectomy. No biliary dilatation. Pancreas: Unremarkable. No pancreatic  ductal dilatation or surrounding inflammatory changes. Spleen: Normal in size without focal abnormality. Adrenals/Urinary Tract: The kidneys and adrenal glands are within normal limits. The bladder is completely decompressed by Foley catheter. Stomach/Bowel: Stomach is within normal limits. No evidence of bowel wall  thickening, distention, or inflammatory changes. The appendix is not seen. There is descending and sigmoid colon diverticulosis. Rectal tube/catheter in place. Vascular/Lymphatic: Aortic atherosclerosis. No enlarged abdominal or pelvic lymph nodes. Reproductive: Status post hysterectomy. No adnexal masses. Other: Presacral edema is present. There is no ascites. There is no retroperitoneal hematoma. There is diffuse body wall edema. No focal abdominal wall hernia. Thoracic catheter/stimulator device is present with generator in the left anterior abdominal wall, unchanged. Musculoskeletal: No acute osseous findings. There is heterogeneous hyperdensity expanding the right gluteus minimus and piriformis musculature measuring 7.4 x 13.8 x 14.1 cm in largest dimensions worrisome for intramuscular hematoma. IMPRESSION: 1. Heterogeneous hyperdensity expanding the right gluteus minimus and piriformis musculature worrisome for intramuscular hematoma. 2. No retroperitoneal hematoma. 3. Diffuse body wall edema and presacral edema. 4. Trace right pleural effusion with bibasilar atelectasis. 5. Colonic diverticulosis without evidence for acute diverticulitis. 6. Foley catheter. 7.  Aortic Atherosclerosis (ICD10-I70.0). Electronically Signed   By: Darliss Cheney M.D.   On: 04/30/2022 16:29    Labs: BMET Recent Labs  Lab 04/25/22 0022 04/26/22 0350 04/27/22 0215 04/28/22 0400 04/29/22 0455 04/30/22 0145 05/01/22 0630  NA 134* 136 135 138 136 137 134*  K 4.0 4.0 4.0 3.8 3.7 4.3 4.0  CL 94* 94* 100 97* 98 96* 97*  CO2 24 24 27 25 27 25 25   GLUCOSE 100* 84 149* 160* 136* 162* 133*  BUN 62* 64* 34* 49* 36* 49* 62*  CREATININE 5.20* 5.45* 3.53* 4.30* 3.11* 3.69* 4.02*  4.11*  CALCIUM 7.0* 6.9* 7.0* 7.4* 7.4* 7.6* 7.3*  PHOS 6.5* 7.4* 5.4* 6.2* 4.9* 5.8* 6.2*    CBC Recent Labs  Lab 04/28/22 0520 04/28/22 1750 04/29/22 0455 04/30/22 0145 04/30/22 0907 04/30/22 1859 05/01/22 0630  WBC 19.5*  --  22.1*  26.9*  --   --  19.3*  NEUTROABS  --   --  19.0* 25.0*  --   --  17.9*  HGB 6.3*   < > 6.0* 6.6* 7.1* 7.2* 7.0*  HCT 18.6*   < > 17.6* 19.0* 20.8* 20.7* 20.5*  MCV 83.4  --  84.6 79.5*  --   --  82.3  PLT 106*  --  163 177  --   --  213   < > = values in this interval not displayed.     Medications:     Chlorhexidine Gluconate Cloth  6 each Topical Daily   DULoxetine  30 mg Oral Daily   folic acid  2 mg Oral Daily   guaiFENesin-dextromethorphan  10 mL Oral Q12H   insulin aspart  0-9 Units Subcutaneous TID WC   insulin glargine-yfgn  8 Units Subcutaneous QHS   linaclotide  145 mcg Oral QAC breakfast   pantoprazole  40 mg Oral Daily   predniSONE  15 mg Oral Q supper   predniSONE  20 mg Oral Q breakfast   senna  2 tablet Oral BID   vitamin B-12  1,000 mcg Oral Daily  07/02/22 MD [facility] Pager [phone]

## 2022-05-02 DIAGNOSIS — G9341 Metabolic encephalopathy: Secondary | ICD-10-CM | POA: Diagnosis not present

## 2022-05-02 LAB — CBC WITH DIFFERENTIAL/PLATELET
Abs Immature Granulocytes: 0 10*3/uL (ref 0.00–0.07)
Abs Immature Granulocytes: 0 10*3/uL (ref 0.00–0.07)
Basophils Absolute: 0 10*3/uL (ref 0.0–0.1)
Basophils Absolute: 0 10*3/uL (ref 0.0–0.1)
Basophils Relative: 0 %
Basophils Relative: 0 %
Eosinophils Absolute: 0.2 10*3/uL (ref 0.0–0.5)
Eosinophils Absolute: 0 10*3/uL (ref 0.0–0.5)
Eosinophils Relative: 1 %
Eosinophils Relative: 0 %
HCT: 24.7 % — ABNORMAL LOW (ref 36.0–46.0)
HCT: 25.6 % — ABNORMAL LOW (ref 36.0–46.0)
Hemoglobin: 8.5 g/dL — ABNORMAL LOW (ref 12.0–15.0)
Hemoglobin: 8.8 g/dL — ABNORMAL LOW (ref 12.0–15.0)
Lymphocytes Relative: 6 %
Lymphocytes Relative: 4 %
Lymphs Abs: 1.1 10*3/uL (ref 0.7–4.0)
Lymphs Abs: 0.7 10*3/uL (ref 0.7–4.0)
MCH: 28.4 pg (ref 26.0–34.0)
MCH: 28.9 pg (ref 26.0–34.0)
MCHC: 34.4 g/dL (ref 30.0–36.0)
MCHC: 34.4 g/dL (ref 30.0–36.0)
MCV: 82.6 fL (ref 80.0–100.0)
MCV: 83.9 fL (ref 80.0–100.0)
Monocytes Absolute: 0.5 10*3/uL (ref 0.1–1.0)
Monocytes Absolute: 0.5 10*3/uL (ref 0.1–1.0)
Monocytes Relative: 3 %
Monocytes Relative: 3 %
Neutro Abs: 15.8 10*3/uL — ABNORMAL HIGH (ref 1.7–7.7)
Neutro Abs: 16.3 10*3/uL — ABNORMAL HIGH (ref 1.7–7.7)
Neutrophils Relative %: 90 %
Neutrophils Relative %: 93 %
Platelets: 199 10*3/uL (ref 150–400)
Platelets: 221 10*3/uL (ref 150–400)
RBC: 2.99 MIL/uL — ABNORMAL LOW (ref 3.87–5.11)
RBC: 3.05 MIL/uL — ABNORMAL LOW (ref 3.87–5.11)
RDW: 17.9 % — ABNORMAL HIGH (ref 11.5–15.5)
RDW: 18 % — ABNORMAL HIGH (ref 11.5–15.5)
WBC: 17.6 10*3/uL — ABNORMAL HIGH (ref 4.0–10.5)
WBC: 17.5 10*3/uL — ABNORMAL HIGH (ref 4.0–10.5)
nRBC: 0 % (ref 0.0–0.2)
nRBC: 0 /100 WBC
nRBC: 0 % (ref 0.0–0.2)
nRBC: 0 /100 WBC

## 2022-05-02 LAB — GLUCOSE, CAPILLARY
Glucose-Capillary: 119 mg/dL — ABNORMAL HIGH (ref 70–99)
Glucose-Capillary: 111 mg/dL — ABNORMAL HIGH (ref 70–99)
Glucose-Capillary: 202 mg/dL — ABNORMAL HIGH (ref 70–99)
Glucose-Capillary: 188 mg/dL — ABNORMAL HIGH (ref 70–99)

## 2022-05-02 LAB — URINALYSIS, ROUTINE W REFLEX MICROSCOPIC
Bilirubin Urine: NEGATIVE
Glucose, UA: NEGATIVE mg/dL
Ketones, ur: NEGATIVE mg/dL
Nitrite: NEGATIVE
Protein, ur: 30 mg/dL — AB
Specific Gravity, Urine: 1.009 (ref 1.005–1.030)
pH: 8 (ref 5.0–8.0)

## 2022-05-02 LAB — RENAL FUNCTION PANEL
Albumin: 1.8 g/dL — ABNORMAL LOW (ref 3.5–5.0)
Anion gap: 10 (ref 5–15)
BUN: 35 mg/dL — ABNORMAL HIGH (ref 6–20)
CO2: 28 mmol/L (ref 22–32)
Calcium: 7.2 mg/dL — ABNORMAL LOW (ref 8.9–10.3)
Chloride: 100 mmol/L (ref 98–111)
Creatinine, Ser: 2.43 mg/dL — ABNORMAL HIGH (ref 0.44–1.00)
GFR, Estimated: 23 mL/min — ABNORMAL LOW (ref >60)
Glucose, Bld: 136 mg/dL — ABNORMAL HIGH (ref 70–99)
Phosphorus: 3.8 mg/dL (ref 2.5–4.6)
Potassium: 3.9 mmol/L (ref 3.5–5.1)
Sodium: 138 mmol/L (ref 135–145)

## 2022-05-02 MED ORDER — AMLODIPINE BESYLATE 5 MG PO TABS
5.0000 mg | ORAL_TABLET | Freq: Every day | ORAL | Status: DC
Start: 2022-05-02 — End: 2022-05-03
  Administered 2022-05-02: 5 mg via ORAL
  Filled 2022-05-02: qty 1

## 2022-05-02 MED ORDER — HEPARIN SODIUM (PORCINE) 1000 UNIT/ML IJ SOLN
INTRAMUSCULAR | Status: AC
  Filled 2022-05-02: qty 4

## 2022-05-02 MED ORDER — MELATONIN 3 MG PO TABS
3.0000 mg | ORAL_TABLET | Freq: Once | ORAL | Status: AC | PRN
  Administered 2022-05-03: 3 mg via ORAL
  Filled 2022-05-02: qty 1

## 2022-05-02 NOTE — TOC Progression Note (Signed)
Transition of Care [facility]) - Progression Note    Patient Details  Name: [name] MRN: [id number] Date of Birth: [birth year]  Transition of Care [facility]) CM/SW Contact  Mearl Latin, LCSW Phone Number: 05/02/2022, 5:22 PM  Clinical Narrative:    No [facility] SN[facility] bed offers at this time. CSW expanded search.    Expected Discharge Plan: Skilled Nursing Facility Barriers to Discharge: English as a second language teacher, SNF Pending bed offer, Continued Medical Work up  Expected Discharge Plan and Services Expected Discharge Plan: Skilled Nursing Facility In-house Referral: Clinical Social Work Discharge Planning Services: CM Consult Post Acute Care Choice: Skilled Nursing Facility Living arrangements for the past 2 months: Single Family Home                                       Social Determinants of Health (SDOH) Interventions    Readmission Risk Interventions    04/18/2022    4:11 PM 04/05/2022    2:33 PM 03/27/2022   10:12 AM  Readmission Risk Prevention Plan  Transportation Screening Complete Complete Complete  Medication Review Oceanographer) Complete Complete Complete  PCP or Specialist appointment within 3-5 days of discharge Complete Complete   HRI or Home Care Consult Complete Complete Complete  SW Recovery Care/Counseling Consult Complete Complete Complete  Palliative Care Screening Not Applicable Not Applicable Not Applicable  Skilled Nursing Facility Not Applicable Not Applicable Not Applicable

## 2022-05-02 NOTE — Progress Notes (Signed)
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   Patient presents with worsening intermittent altered mental status and increased weakness.  She has been declining since approximately January, 6 months ago per husband.  She was recently admitted to our facility and treated for pneumonia with moderate improvement.  She was to continue on vancomycin through 04/08/2022 followed by infectious disease.  Bronchoscopy during that admission on 03/30/2022 shows Candida in the setting of ongoing antibiotics and urine culture ultimately grew yeast - deemed to be colonized and not active infection given fungitel was reassuringly low per ID discussion. Of note she is also had a recent diagnosis of pulmonary embolism on apixaban.   Patient admitted this hospitalization (on 04/17/22) with elevated white count, lactic acid, and acute renal failure in the setting of poor p.o. intake altered mental status tachycardia and tachypnea concerning for sepsis. Patient's infectious source was presumed to be pneumonia versus UTI - placed initially on broad-spectrum antibiotics of vancomycin and cefepime. Her sepsis criteria did not improve and her renal function continued to worsen in the setting of presumed sepsis and rhabdomyolysis (ATN). Nephrology following with worsening mental status, tremors/muscle spasms with metabolic derangements. Patient transferred to main campus for neurology evaluation, EEG ultimately unremarkable for seizures-her movement was considered to be musculoskeletal, somewhat choreiform in nature, holding off on LP per neuro given improvements with HD and intrathecal pump turned down (unable to be turned  off but can be turned to [facility]).  Assessment & Plan:   Principal Problem:   Acute metabolic encephalopathy Active Problems:   Acute cystitis without hematuria   Current chronic use of systemic steroids   Chronic pain   Secondary adrenal insufficiency (HCC)   AKI (acute kidney injury) (HCC)   Granulomatosis with polyangiitis (HCC)   GERD (gastroesophageal reflux disease)   Essential hypertension   Obesity (BMI 30-39.9)   COVID-19 virus infection   Severe sepsis (HCC)   Pulmonary embolism (HCC)   Transaminitis   Rhabdomyolysis  Acute metabolic encephalopathy, POA, multifactorial - Initially presumed to be secondary to sepsis/renal failure/hyponatremia  - Potential covid encephalitis, however spastic choreiform movements are not consistent with COVID encephalitis - MRI unremarkable for acute process  - EEG unremarkable for seizure - Discontinued all home CNS depressant medications - continued Cymbalta only - Mental status improving after initiating dialysis and discontinuation of intrathecal pain pump - Discussed with neurology.  Holding off on LP and CTA head at this time with improvement.  Neurology signed off 7/6   Acute blood loss anemia on anemia of chronic kidney disease, history of chronic iron deficiency anemia: Noted some bleeding from her dialysis catheter as well as minimal rectal bleeding with history of hemorrhoids and fissure - Transfused 2 unit packed red blood cell 7/6, 1 unit 7/7 and 1 unit on 04/29/2022 as well as 04/30/2022, and then 1 more unit on 1723.  CT abdomen and pelvis on 04/30/2022 shows right gluteus minimus and piriformis hematoma.  General surgery was consulted however per them, no indication for any surgery at the moment and they recommended continuing to transfuse as needed.  Finally her hemoglobin is stable and has improved over 9.  No indication for transfusion yet.  Hopefully bleeding is stopped.  We will monitor closely.  Please note following: - Followed  by [facility] with history of chronic normocytic anemia as well as iron deficiency anemia with hemoglobin ranging from 8.9-13.6.  Has been getting outpatient iron infusion biweekly. - Followed by [facility].  Had normal colonoscopy 2014, flex sigmoidoscopy 2020 for rectal bleeding which revealed deep anal fissure and external hemorrhoids.  Underwent Botox, then lateral internal anal sphincterotomy.  EGD November 2022 was normal.  Colonoscopy revealed multiple diverticula, internal hemorrhoids, 3 polyps. Suspect this is likely due to either hemorrhoids/fissure/diverticula in setting of chronic iron deficiency and anemia of chronic disease.    Polypharmacy risk - Home medications include bupivacaine/zinconotide intrathecal baclofen pump, duloxetine, gabapentin, methocarbamol, nucynta, oxycodone, Topamax.   Chronic pain, neuropathy, and chronic opioid dependence  - Patient has intrathecal pump with bupivacaine and ziconotide - turned down to lowest setting 7/3 (cannot stop completely per tech) - Consider restarting low dose PO oxy vs nucynta    AKI/ATN currently requiring HD - Complicated and multifactorial etiology, possibly ATN per nephrology - Temp HD catheter initially placed 04/24/2022 - issues with bleeding, but no more.  Last hemodialysis 04/28/2022.  She received dialysis again on 05/01/2022.  After dialysis, patient's mentation has much improved and she is more cheerful.  Her friend at the bedside also agrees with that.   Granulomatosis with polyangiitis  - Patient receiving rituximab and methotrexate as an outpatient (most recently at [facility] in May 2023) - confirmed with rheumatology at [facility] (Dr. Dimple Casey) - Nephrology sent off multiple biomarkers: *Of note C4, and Kappa light chains* were elevated minimally but otherwise ANCA, ANA, RF, anti-dsDNA, c3, spep/sflc without abnormalities, hepatitis panel, HIV, EBV, CMV were unremarkable - Cryos still pending  - Continue prednisone  qAM and   qPM - continue to wean to baseline level (  daily)    Secondary adrenal insufficiency  -Continue steroids    Current chronic use of systemic steroids -On 5 mg daily at baseline.  Continue to wean   Acute hypoxic respiratory failure, POA -Multifactorial in the setting of metabolic encephalopathy, presumed pneumonia, granulomatosis/polyangiitis, concurrent COVID-19 infection -Potentially exacerbated by hypervolemia surrounding renal failure -Resolved and remains on room air   Presumed severe sepsis secondary to pneumonia versus UTI -Completed 5 days of antibiotic therapy   C. difficile colonization -C. difficile toxin is negative.  No indication of treatment at this time   GERD  -PPI    Rhabdomyolysis -Secondary to prolonged downtime -Improved   Elevated transaminitis -Most likely in the setting of sepsis/shock liver and rhabdomyolysis. -Improved   History of pulmonary embolism  -Lovenox on hold due to recurrent anemia requiring blood transfusion.   COVID-19 virus infection, questionably incidental - COVID PCR incidentally positive at time of admission, without respiratory symptoms - Remdesivir withheld due to elevated liver enzymes - Completed 10-day quarantine   Diabetes mellitus -A1c 8.6 -Blood sugar controlled, continue current dose of Semglee, sliding scale insulin   Obesity  Estimated body mass index is 38.37 kg/m as calculated from the following:   Height as of this encounter:  (1.651 m).   Weight as of this encounter: 104.6 kg.  DVT prophylaxis: Place and maintain sequential compression device Start: 04/28/22 1251 avoiding heparin products due to continuous bleed requiring transfusion   Code Status: Full Code  Family Communication: Her best friend present at bedside.  Plan of care discussed with patient, her best friend as well as over the phone with her husband in length and he/she verbalized understanding and agreed with it.  Status is:  Inpatient Remains inpatient appropriate because: Recurrent bleeding requiring blood transfusion and worsening creatinine requiring HD   Estimated body mass index is 37.46 kg/m as calculated from the following:   Height as of this encounter: 5\' 5"  (1.651 m).   Weight as of this encounter: 102.1 kg.    Nutritional Assessment: Body mass index is 37.46 kg/m. Seen by dietician.  I agree with the assessment and plan as outlined below: Nutrition Status . Skin Assessment: I have examined the patient's skin and I agree with the wound assessment as performed by the wound care RN as outlined below:    Consultants:  Nephrology Neurology-signed off General surgery  Procedures:  As above  Antimicrobials:  Anti-infectives (From admission, onward)    Start     Dose/Rate Route Frequency Ordered Stop   04/21/22 2200  meropenem (MERREM) 500 mg in sodium chloride 0.9 % 100 mL IVPB  Status:  Discontinued        500 mg 200 mL/hr over 30 Minutes Intravenous Every 12 hours 04/21/22 1507 04/22/22 1945   04/21/22 1000  fluconazole (DIFLUCAN) tablet 100 mg  Status:  Discontinued        100 mg Oral Daily 04/20/22 0952 04/22/22 1945   04/20/22 1500  meropenem (MERREM) 1 g in sodium chloride 0.9 % 100 mL IVPB  Status:  Discontinued        1 g 200 mL/hr over 30 Minutes Intravenous Every 12 hours 04/20/22 1408 04/21/22 1507   04/20/22 1045  fluconazole (DIFLUCAN) tablet 200 mg        200 mg Oral  Once 04/20/22 0952 04/20/22 1207   04/20/22 1021  ceFEPIme (MAXIPIME) 2 g in sodium chloride 0.9 % 100 mL IVPB  Status:  Discontinued        2 g 200 mL/hr over 30 Minutes Intravenous Every 24 hours 04/19/22 1355 04/20/22 1334   04/19/22 1100  linezolid (ZYVOX) IVPB 600 mg  Status:  Discontinued        600 mg 300 mL/hr over 60 Minutes Intravenous Every 12 hours 04/19/22 1014 04/22/22 1945   04/18/22 1200  vancomycin (VANCOREADY) IVPB 750 mg/150 mL  Status:  Discontinued        750 mg 150 mL/hr over 60  Minutes Intravenous Every 24 hours 04/17/22 1306 04/19/22 1014   04/17/22 2200  ceFEPIme (MAXIPIME) 2 g in sodium chloride 0.9 % 100 mL IVPB  Status:  Discontinued        2 g 200 mL/hr over 30 Minutes Intravenous Every 12 hours 04/17/22 1306 04/19/22 1355   04/17/22 1300  vancomycin (VANCOREADY) IVPB 2000 mg/400 mL        2,000 mg 200 mL/hr over 120 Minutes Intravenous  Once 04/17/22 1208 04/17/22 1729   04/17/22 1200  ceFEPIme (MAXIPIME) 2 g in sodium chloride 0.9 % 100 mL IVPB        2 g 200 mL/hr over 30 Minutes Intravenous  Once 04/17/22 1153 04/17/22 1330   04/17/22 1200  metroNIDAZOLE (FLAGYL) IVPB 500 mg        500 mg 100 mL/hr over 60 Minutes Intravenous  Once 04/17/22 1153 04/17/22 1435   04/17/22 1200  vancomycin (VANCOCIN) IVPB 1000 mg/200 mL premix  Status:  Discontinued        1,000 mg 200 mL/hr over 60 Minutes Intravenous  Once 04/17/22 1153 04/17/22 1208         Subjective:  Patient seen and examined.  Friend at the bedside.  Patient  has no complaints.  She feels better than yesterday.  Objective: Vitals:   05/02/22 0330 05/02/22 0400 05/02/22 0421 05/02/22 1009  BP: (!) 157/90 137/88 (!) 158/92 (!) 150/81  Pulse: 77 88 85   Resp: 17 18 15    Temp:   98 F (36.7 C)   TempSrc:   Axillary   SpO2: 100% 100% 100%   Weight:   102.1 kg   Height:        Intake/Output Summary (Last 24 hours) at 05/02/2022 1349 Last data filed at 05/01/2022 1600 Gross per 24 hour  Intake --  Output 750 ml  Net -750 ml    Filed Weights   04/26/22 1914 04/28/22 1645 05/02/22 0421  Weight: 105.2 kg 100.7 kg 102.1 kg    Examination:  General exam: Appears calm and comfortable, obese Respiratory system: Clear to auscultation. Respiratory effort normal. Cardiovascular system: S1 & S2 heard, RRR. No JVD, murmurs, rubs, gallops or clicks.  +3 pitting edema bilateral lower extremity Gastrointestinal system: Abdomen is nondistended, soft and nontender. No organomegaly or masses felt.  Normal bowel sounds heard. Central nervous system: Alert and oriented. No focal neurological deficits. Extremities: Symmetric 5 x 5 power.  Bilateral forefoot amputation. Skin: No rashes, lesions or ulcers.  Psychiatry: Judgement and insight appear normal. Mood & affect appropriate.   Data Reviewed: I have personally reviewed following labs and imaging studies  CBC: Recent Labs  Lab 04/29/22 0455 04/30/22 0145 04/30/22 0907 04/30/22 1859 05/01/22 0630 05/01/22 1101 05/02/22 0636  WBC 22.1* 26.9*  --   --  19.3* 21.1* 17.6*  NEUTROABS 19.0* 25.0*  --   --  17.9* 17.4* 15.8*  HGB 6.0* 6.6* 7.1* 7.2* 7.0* 8.9* 8.5*  HCT 17.6* 19.0* 20.8* 20.7* 20.5* 25.8* 24.7*  MCV 84.6 79.5*  --   --  82.3 83.0 82.6  PLT 163 177  --   --  213 214 199    Basic Metabolic Panel: Recent Labs  Lab 04/28/22 0400 04/29/22 0455 04/30/22 0145 05/01/22 0630 05/02/22 0636  NA 138 136 137 134* 138  K 3.8 3.7 4.3 4.0 3.9  CL 97* 98 96* 97* 100  CO2 25 27 25 25 28   GLUCOSE 160* 136* 162* 133* 136*  BUN 49* 36* 49* 62* 35*  CREATININE 4.30* 3.11* 3.69* 4.02*  4.11* 2.43*  CALCIUM 7.4* 7.4* 7.6* 7.3* 7.2*  PHOS 6.2* 4.9* 5.8* 6.2* 3.8    GFR: Estimated Creatinine Clearance: 30.6 mL/min (A) (by C-G formula based on SCr of 2.43 mg/dL (H)). Liver Function Tests: Recent Labs  Lab 04/26/22 0350 04/27/22 0215 04/28/22 0400 04/29/22 0455 04/30/22 0145 05/01/22 0630 05/02/22 0636  AST 72* 54* 52*  --   --   --   --   ALT 54* 40 34  --   --   --   --   ALKPHOS 56 54 44  --   --   --   --   BILITOT 1.1 1.0 1.1  --   --   --   --   PROT 4.4* 4.1* 4.1*  --   --   --   --   ALBUMIN 1.8*  1.8* 1.6*  1.6* 1.7*  1.7* 1.7* 1.8* 1.8* 1.8*    No results for input(s): "LIPASE", "AMYLASE" in the last 168 hours. No results for input(s): "AMMONIA" in the last 168 hours. Coagulation Profile: No results for input(s): "INR", "PROTIME" in the last 168 hours. Cardiac Enzymes: Recent Labs  Lab  04/26/22  0350 04/27/22 0215 04/28/22 0400  CKTOTAL 2,171* 1,035* 1,772*    BNP (last 3 results) No results for input(s): "PROBNP" in the last 8760 hours. HbA1C: No results for input(s): "HGBA1C" in the last 72 hours. CBG: Recent Labs  Lab 05/01/22 1154 05/01/22 1741 05/01/22 2155 05/02/22 0633 05/02/22 1303  GLUCAP 105* 170* 223* 119* 111*    Lipid Profile: No results for input(s): "CHOL", "HDL", "LDLCALC", "TRIG", "CHOLHDL", "LDLDIRECT" in the last 72 hours. Thyroid Function Tests: No results for input(s): "TSH", "T4TOTAL", "FREET4", "T3FREE", "THYROIDAB" in the last 72 hours. Anemia Panel: Recent Labs    04/29/22 1400  TIBC 213*  IRON 26*    Sepsis Labs: No results for input(s): "PROCALCITON", "LATICACIDVEN" in the last 168 hours.  Recent Results (from the past 240 hour(s))  Gastrointestinal Panel by PCR , Stool     Status: None   Collection Time: 04/23/22  3:55 PM   Specimen: Stool  Result Value Ref Range Status   Campylobacter species NOT DETECTED NOT DETECTED Final   Plesimonas shigelloides NOT DETECTED NOT DETECTED Final   Salmonella species NOT DETECTED NOT DETECTED Final   Yersinia enterocolitica NOT DETECTED NOT DETECTED Final   Vibrio species NOT DETECTED NOT DETECTED Final   Vibrio cholerae NOT DETECTED NOT DETECTED Final   Enteroaggregative E coli (EAEC) NOT DETECTED NOT DETECTED Final   Enteropathogenic E coli (EPEC) NOT DETECTED NOT DETECTED Final   Enterotoxigenic E coli (ETEC) NOT DETECTED NOT DETECTED Final   Shiga like toxin producing E coli (STEC) NOT DETECTED NOT DETECTED Final   Shigella/Enteroinvasive E coli (EIEC) NOT DETECTED NOT DETECTED Final   Cryptosporidium NOT DETECTED NOT DETECTED Final   Cyclospora cayetanensis NOT DETECTED NOT DETECTED Final   Entamoeba histolytica NOT DETECTED NOT DETECTED Final   Giardia lamblia NOT DETECTED NOT DETECTED Final   Adenovirus F40/41 NOT DETECTED NOT DETECTED Final   Astrovirus NOT DETECTED NOT  DETECTED Final   Norovirus GI/GII NOT DETECTED NOT DETECTED Final   Rotavirus A NOT DETECTED NOT DETECTED Final   Sapovirus (I, II, IV, and V) NOT DETECTED NOT DETECTED Final    Comment: Performed at [facility], [address] [place], [place] [address]  C Difficile Quick Screen (NO PCR Reflex)     Status: Abnormal   Collection Time: 04/23/22  3:57 PM   Specimen: Stool  Result Value Ref Range Status   C Diff antigen POSITIVE (A) NEGATIVE Final   C Diff toxin NEGATIVE NEGATIVE Final   C Diff interpretation   Final    Results are indeterminate. Please contact the provider listed for your campus for C diff questions in AMION.    Comment: Performed at [facility], [address], [place], [place] [address]     Radiology Studies: CT ABDOMEN PELVIS WO CONTRAST  Result Date: 04/30/2022 CLINICAL DATA:  Retroperitoneal hematoma. EXAM: CT ABDOMEN AND PELVIS WITHOUT CONTRAST TECHNIQUE: Multidetector CT imaging of the abdomen and pelvis was performed following the standard protocol without IV contrast. RADIATION DOSE REDUCTION: This exam was performed according to the departmental dose-optimization program which includes automated exposure control, adjustment of the mA and/or kV according to patient size and/or use of iterative reconstruction technique. COMPARISON:  Renal ultrasound 04/18/2022. CT chest abdomen and pelvis 07/09/2016. FINDINGS: Lower chest: There is some atelectasis in the bilateral lung bases. There is a trace right pleural effusion. Hepatobiliary: No focal liver abnormality is seen. Status post cholecystectomy. No biliary dilatation. Pancreas: Unremarkable. No pancreatic ductal  dilatation or surrounding inflammatory changes. Spleen: Normal in size without focal abnormality. Adrenals/Urinary Tract: The kidneys and adrenal glands are within normal limits. The bladder is completely decompressed by Foley catheter. Stomach/Bowel: Stomach is within normal limits. No evidence of  bowel wall thickening, distention, or inflammatory changes. The appendix is not seen. There is descending and sigmoid colon diverticulosis. Rectal tube/catheter in place. Vascular/Lymphatic: Aortic atherosclerosis. No enlarged abdominal or pelvic lymph nodes. Reproductive: Status post hysterectomy. No adnexal masses. Other: Presacral edema is present. There is no ascites. There is no retroperitoneal hematoma. There is diffuse body wall edema. No focal abdominal wall hernia. Thoracic catheter/stimulator device is present with generator in the left anterior abdominal wall, unchanged. Musculoskeletal: No acute osseous findings. There is heterogeneous hyperdensity expanding the right gluteus minimus and piriformis musculature measuring 7.4 x 13.8 x 14.1 cm in largest dimensions worrisome for intramuscular hematoma. IMPRESSION: 1. Heterogeneous hyperdensity expanding the right gluteus minimus and piriformis musculature worrisome for intramuscular hematoma. 2. No retroperitoneal hematoma. 3. Diffuse body wall edema and presacral edema. 4. Trace right pleural effusion with bibasilar atelectasis. 5. Colonic diverticulosis without evidence for acute diverticulitis. 6. Foley catheter. 7.  Aortic Atherosclerosis (ICD10-I70.0). Electronically Signed   By: Darliss CheneyAmy  Guttmann M.D.   On: 04/30/2022 16:29    Scheduled Meds:  amLODipine  5 mg Oral Daily   Chlorhexidine Gluconate Cloth  6 each Topical Daily   DULoxetine  30 mg Oral Daily   folic acid  2 mg Oral Daily   guaiFENesin-dextromethorphan  10 mL Oral Q12H   heparin sodium (porcine)       insulin aspart  0-9 Units Subcutaneous TID WC   insulin glargine-yfgn  8 Units Subcutaneous QHS   linaclotide  145 mcg Oral QAC breakfast   pantoprazole  40 mg Oral Daily   predniSONE  15 mg Oral Q supper   predniSONE  20 mg Oral Q breakfast   senna  2 tablet Oral BID   vitamin B-12  1,000 mcg Oral Daily   Continuous Infusions:    LOS: 15 days   Hughie Clossavi Munira Polson, MD [facility]  05/02/2022, 1:49 PM   *Please note that this is a verbal dictation therefore any spelling or grammatical errors are due to the "Dragon Medical One" system interpretation.  Please page via Amion and do not message via secure chat for urgent patient care matters. Secure chat can be used for non urgent patient care matters.  How to contact the [facility] Attending or Consulting provider 7A - 7P or covering provider during after hours 7P -7A, for this patient?  Check the care team in [facility] and look for a) attending/consulting [facility] provider listed and b) the [facility] team listed. Page or secure chat 7A-7P. Log into [email or web address] and use [facility]'s universal password to access. If you do not have the password, please contact the hospital operator. Locate the [facility] provider you are looking for under [facility] and page to a number that you can be directly reached. If you still have difficulty reaching the provider, please page the [facility] (Director on Call) for the Hospitalists listed on amion for assistance.

## 2022-05-02 NOTE — Plan of Care (Signed)

## 2022-05-02 NOTE — Progress Notes (Signed)
Patient ID: [name], female   DOB: [birth year], 56 y.o.   MRN: [id number] [facility] Progress Note   Assessment/ Plan:   1. Acute kidney Injury: Normal renal function at baseline now with AKI in setting of UTI.  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  Will repeat UA today to see if clear now - not sent yesterday, dw RN and will be sent now.  May need renal biopsy  for diagnostic/prognostic purposes but if UA is bland I favor holding off on renal biopsy.    She underwent HD on early AM 7/11 for possible uremic symptoms - less talkative/interactive.  BUN was only in 60s but family feels significantly improved.  Will continue daily labs/assessment for dialysis eneds.   2.  Anemia: Previously with some bleeding around her dialysis catheter and some transient hematochezia.  Status post PRBC transfusions, CT 7/10 with gluteal hematoma for which surgery was consulted and is observing for now.  Hb in 8s today.  3.  Altered mental status: Suspected to be multifactorial etiology with lumbar puncture deferred after improvement of mental status with dialysis and reduction of baclofen rate.  MS again worse and had HD 7/11 - improvement noted by family with her talking more today.  Will have low threshold for repeat dialysis.     4.  Sepsis: Suspected to be of pneumonia/urinary tract infection etiology and status post completion of antibiotics. 5.  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  Dx 15y ago = on chronic methotrexate/rituximab and pred ( sees Dr. Talbert Cage with rheumatology). Per above doubt renal GPA but rechecking UA today.  6.  COVID-19 virus infection: Incidental positive COVID PCR found at admission.  Remains on  corticosteroids and did not get remdesivir due to elevated LFTs.  Subjective:   Trial of dialysis yesterday to see if would help mental status - not completed until overnight. Family feels much improved today.    Objective:   BP (!) 150/81   Pulse 85   Temp 98 F (36.7 C) (Axillary)   Resp 15   Ht 5\' 5"  (1.651 m)   Wt 102.1 kg   SpO2 100%   BMI 37.46 kg/m   Intake/Output Summary (Last 24 hours) at 05/02/2022 1452 Last data filed at 05/01/2022 1600 Gross per 24 hour  Intake --  Output 750 ml  Net -750 ml    Weight change:   Physical Exam: Gen: Appears comfortable resting in bed, son standing at bedside  CVS: Pulse regular rhythm, normal rate, S1 and S2 normal Resp: Transmitted breath sounds bilaterally without rales/rhonchi Abd: Soft, obese, nontender, bowel sounds normal Ext: Trace lower extremity edema.  Bilateral forefoot amputations noted Neuro: speaking and answering questions today  Imaging: CT ABDOMEN PELVIS WO CONTRAST  Result Date: 04/30/2022 CLINICAL DATA:  Retroperitoneal hematoma. EXAM: CT ABDOMEN AND PELVIS WITHOUT CONTRAST TECHNIQUE: Multidetector CT imaging of the abdomen and pelvis was performed following the standard protocol without IV contrast. RADIATION DOSE REDUCTION: This exam was performed according to the departmental dose-optimization program which includes automated exposure control, adjustment of the mA and/or kV according to patient size and/or use of iterative reconstruction technique. COMPARISON:  Renal ultrasound 04/18/2022. CT chest abdomen and pelvis 07/09/2016. FINDINGS: Lower chest: There is  some atelectasis in the bilateral lung bases. There is a trace right pleural effusion. Hepatobiliary: No focal liver abnormality is seen. Status post cholecystectomy. No biliary dilatation. Pancreas: Unremarkable. No pancreatic ductal dilatation or surrounding inflammatory changes. Spleen: Normal in size without focal abnormality. Adrenals/Urinary Tract: The  kidneys and adrenal glands are within normal limits. The bladder is completely decompressed by Foley catheter. Stomach/Bowel: Stomach is within normal limits. No evidence of bowel wall thickening, distention, or inflammatory changes. The appendix is not seen. There is descending and sigmoid colon diverticulosis. Rectal tube/catheter in place. Vascular/Lymphatic: Aortic atherosclerosis. No enlarged abdominal or pelvic lymph nodes. Reproductive: Status post hysterectomy. No adnexal masses. Other: Presacral edema is present. There is no ascites. There is no retroperitoneal hematoma. There is diffuse body wall edema. No focal abdominal wall hernia. Thoracic catheter/stimulator device is present with generator in the left anterior abdominal wall, unchanged. Musculoskeletal: No acute osseous findings. There is heterogeneous hyperdensity expanding the right gluteus minimus and piriformis musculature measuring 7.4 x 13.8 x 14.1 cm in largest dimensions worrisome for intramuscular hematoma. IMPRESSION: 1. Heterogeneous hyperdensity expanding the right gluteus minimus and piriformis musculature worrisome for intramuscular hematoma. 2. No retroperitoneal hematoma. 3. Diffuse body wall edema and presacral edema. 4. Trace right pleural effusion with bibasilar atelectasis. 5. Colonic diverticulosis without evidence for acute diverticulitis. 6. Foley catheter. 7.  Aortic Atherosclerosis (ICD10-I70.0). Electronically Signed   By: Darliss Cheney M.D.   On: 04/30/2022 16:29    Labs: BMET Recent Labs  Lab 04/26/22 0350 04/27/22 0215 04/28/22 0400 04/29/22 0455 04/30/22 0145 05/01/22 0630 05/02/22 0636  NA 136 135 138 136 137 134* 138  K 4.0 4.0 3.8 3.7 4.3 4.0 3.9  CL 94* 100 97* 98 96* 97* 100  CO2 24 27 25 27 25 25 28   GLUCOSE 84 149* 160* 136* 162* 133* 136*  BUN 64* 34* 49* 36* 49* 62* 35*  CREATININE 5.45* 3.53* 4.30* 3.11* 3.69* 4.02*  4.11* 2.43*  CALCIUM 6.9* 7.0* 7.4* 7.4* 7.6* 7.3* 7.2*  PHOS 7.4* 5.4*  6.2* 4.9* 5.8* 6.2* 3.8    CBC Recent Labs  Lab 04/30/22 0145 04/30/22 0907 04/30/22 1859 05/01/22 0630 05/01/22 1101 05/02/22 0636  WBC 26.9*  --   --  19.3* 21.1* 17.6*  NEUTROABS 25.0*  --   --  17.9* 17.4* 15.8*  HGB 6.6*   < > 7.2* 7.0* 8.9* 8.5*  HCT 19.0*   < > 20.7* 20.5* 25.8* 24.7*  MCV 79.5*  --   --  82.3 83.0 82.6  PLT 177  --   --  213 214 199   < > = values in this interval not displayed.     Medications:     amLODipine  5 mg Oral Daily   Chlorhexidine Gluconate Cloth  6 each Topical Daily   DULoxetine  30 mg Oral Daily   folic acid  2 mg Oral Daily   guaiFENesin-dextromethorphan  10 mL Oral Q12H   heparin sodium (porcine)       insulin aspart  0-9 Units Subcutaneous TID WC   insulin glargine-yfgn  8 Units Subcutaneous QHS   linaclotide  145 mcg Oral QAC breakfast   pantoprazole  40 mg Oral Daily   predniSONE  15 mg Oral Q supper   predniSONE  20 mg Oral Q breakfast   senna  2 tablet Oral BID   vitamin B-12  1,000 mcg Oral Daily  07/03/22 MD [facility] Pager [phone]

## 2022-05-03 DIAGNOSIS — G9341 Metabolic encephalopathy: Secondary | ICD-10-CM | POA: Diagnosis not present

## 2022-05-03 LAB — GLUCOSE, CAPILLARY
Glucose-Capillary: 114 mg/dL — ABNORMAL HIGH (ref 70–99)
Glucose-Capillary: 91 mg/dL (ref 70–99)
Glucose-Capillary: 184 mg/dL — ABNORMAL HIGH (ref 70–99)
Glucose-Capillary: 190 mg/dL — ABNORMAL HIGH (ref 70–99)

## 2022-05-03 LAB — RENAL FUNCTION PANEL
Albumin: 2.1 g/dL — ABNORMAL LOW (ref 3.5–5.0)
Anion gap: 8 (ref 5–15)
BUN: 47 mg/dL — ABNORMAL HIGH (ref 6–20)
CO2: 25 mmol/L (ref 22–32)
Calcium: 7.4 mg/dL — ABNORMAL LOW (ref 8.9–10.3)
Chloride: 104 mmol/L (ref 98–111)
Creatinine, Ser: 2.81 mg/dL — ABNORMAL HIGH (ref 0.44–1.00)
GFR, Estimated: 19 mL/min — ABNORMAL LOW (ref >60)
Glucose, Bld: 132 mg/dL — ABNORMAL HIGH (ref 70–99)
Phosphorus: 4.4 mg/dL (ref 2.5–4.6)
Potassium: 4.1 mmol/L (ref 3.5–5.1)
Sodium: 137 mmol/L (ref 135–145)

## 2022-05-03 LAB — CBC WITH DIFFERENTIAL/PLATELET
Abs Immature Granulocytes: 1.05 10*3/uL — ABNORMAL HIGH (ref 0.00–0.07)
Basophils Absolute: 0.1 10*3/uL (ref 0.0–0.1)
Basophils Relative: 0 %
Eosinophils Absolute: 0 10*3/uL (ref 0.0–0.5)
Eosinophils Relative: 0 %
HCT: 23.5 % — ABNORMAL LOW (ref 36.0–46.0)
Hemoglobin: 8 g/dL — ABNORMAL LOW (ref 12.0–15.0)
Immature Granulocytes: 6 %
Lymphocytes Relative: 14 %
Lymphs Abs: 2.4 10*3/uL (ref 0.7–4.0)
MCH: 28.6 pg (ref 26.0–34.0)
MCHC: 34 g/dL (ref 30.0–36.0)
MCV: 83.9 fL (ref 80.0–100.0)
Monocytes Absolute: 1.2 10*3/uL — ABNORMAL HIGH (ref 0.1–1.0)
Monocytes Relative: 7 %
Neutro Abs: 12.5 10*3/uL — ABNORMAL HIGH (ref 1.7–7.7)
Neutrophils Relative %: 73 %
Platelets: 194 10*3/uL (ref 150–400)
RBC: 2.8 MIL/uL — ABNORMAL LOW (ref 3.87–5.11)
RDW: 17.9 % — ABNORMAL HIGH (ref 11.5–15.5)
WBC: 17.2 10*3/uL — ABNORMAL HIGH (ref 4.0–10.5)
nRBC: 0 % (ref 0.0–0.2)

## 2022-05-03 MED ORDER — AMLODIPINE BESYLATE 10 MG PO TABS
10.0000 mg | ORAL_TABLET | Freq: Every day | ORAL | Status: DC
  Administered 2022-05-03 – 2022-05-18 (×16): 10 mg via ORAL
  Filled 2022-05-03 (×16): qty 1

## 2022-05-03 MED ORDER — HEPARIN SODIUM (PORCINE) 1000 UNIT/ML IJ SOLN
2600.0000 [IU] | Freq: Once | INTRAMUSCULAR | Status: AC
  Administered 2022-05-04: 2600 [IU] via INTRAVENOUS
  Filled 2022-05-03 (×3): qty 2.6

## 2022-05-03 MED ORDER — DIPHENHYDRAMINE HCL 50 MG/ML IJ SOLN
12.5000 mg | Freq: Three times a day (TID) | INTRAMUSCULAR | Status: DC | PRN
  Administered 2022-05-04 – 2022-05-07 (×3): 12.5 mg via INTRAVENOUS
  Filled 2022-05-03 (×3): qty 1

## 2022-05-03 NOTE — Progress Notes (Signed)
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   Patient presents with worsening intermittent altered mental status and increased weakness.  She has been declining since approximately January, 6 months ago per husband.  She was recently admitted to our facility and treated for pneumonia with moderate improvement.  She was to continue on vancomycin through 04/08/2022 followed by infectious disease.  Bronchoscopy during that admission on 03/30/2022 shows Candida in the setting of ongoing antibiotics and urine culture ultimately grew yeast - deemed to be colonized and not active infection given fungitel was reassuringly low per ID discussion. Of note she is also had a recent diagnosis of pulmonary embolism on apixaban.   Patient admitted this hospitalization (on 04/17/22) with elevated white count, lactic acid, and acute renal failure in the setting of poor p.o. intake altered mental status tachycardia and tachypnea concerning for sepsis. Patient's infectious source was presumed to be pneumonia versus UTI - placed initially on broad-spectrum antibiotics of vancomycin and cefepime. Her sepsis criteria did not improve and her renal function continued to worsen in the setting of presumed sepsis and rhabdomyolysis (ATN). Nephrology following with worsening mental status, tremors/muscle spasms with metabolic derangements. Patient transferred to main campus for neurology evaluation, EEG ultimately unremarkable for seizures-her movement was considered to be musculoskeletal, somewhat choreiform in nature, holding off on LP per neuro given improvements with HD and intrathecal pump turned down (unable to be turned  off but can be turned to [facility]).  Assessment & Plan:   Principal Problem:   Acute metabolic encephalopathy Active Problems:   Acute cystitis without hematuria   Current chronic use of systemic steroids   Chronic pain   Secondary adrenal insufficiency (HCC)   AKI (acute kidney injury) (HCC)   Granulomatosis with polyangiitis (HCC)   GERD (gastroesophageal reflux disease)   Essential hypertension   Obesity (BMI 30-39.9)   COVID-19 virus infection   Severe sepsis (HCC)   Pulmonary embolism (HCC)   Transaminitis   Rhabdomyolysis  Acute metabolic encephalopathy, POA, multifactorial - Initially presumed to be secondary to sepsis/renal failure/hyponatremia  - Potential covid encephalitis, however spastic choreiform movements are not consistent with COVID encephalitis - MRI unremarkable for acute process  - EEG unremarkable for seizure - Discontinued all home CNS depressant medications - continued Cymbalta only - Mental status improving after initiating dialysis and discontinuation of intrathecal pain pump - Discussed with neurology.  Holding off on LP and CTA head at this time with improvement.  Neurology signed off 7/6   Acute blood loss anemia on anemia of chronic kidney disease, history of chronic iron deficiency anemia: Noted some bleeding from her dialysis catheter as well as minimal rectal bleeding with history of hemorrhoids and fissure - Transfused 2 unit packed red blood cell 7/6, 1 unit 7/7 and 1 unit on 04/29/2022 as well as 04/30/2022, and then 1 more unit on 1723.  CT abdomen and pelvis on 04/30/2022 shows right gluteus minimus and piriformis hematoma.  General surgery was consulted however per them, no indication for any surgery at the moment and they recommended continuing to transfuse as needed.  Finally her hemoglobin is fairly stable with slight drop with hemoglobin of 8.0 today, no indication of transfusion yet.  Hopefully this will stabilize.  Please note  following: - Followed  by [facility] with history of chronic normocytic anemia as well as iron deficiency anemia with hemoglobin ranging from 8.9-13.6.  Has been getting outpatient iron infusion biweekly. - Followed by [facility].  Had normal colonoscopy 2014, flex sigmoidoscopy 2020 for rectal bleeding which revealed deep anal fissure and external hemorrhoids.  Underwent Botox, then lateral internal anal sphincterotomy.  EGD November 2022 was normal.  Colonoscopy revealed multiple diverticula, internal hemorrhoids, 3 polyps. Suspect this is likely due to either hemorrhoids/fissure/diverticula in setting of chronic iron deficiency and anemia of chronic disease.    Polypharmacy risk - Home medications include bupivacaine/zinconotide intrathecal baclofen pump, duloxetine, gabapentin, methocarbamol, nucynta, oxycodone, Topamax.   Chronic pain, neuropathy, and chronic opioid dependence  - Patient has intrathecal pump with bupivacaine and ziconotide - turned down to lowest setting 7/3 (cannot stop completely per tech) - Consider restarting low dose PO oxy vs nucynta    AKI/ATN currently requiring HD - Complicated and multifactorial etiology, possibly ATN per nephrology - Temp HD catheter initially placed 04/24/2022 - issues with bleeding, but no more.  Last hemodialysis 05/01/2022.  Patient improved after dialysis however she has improved further today compared to yesterday.   Granulomatosis with polyangiitis  - Patient receiving rituximab and methotrexate as an outpatient ([facility] - [facility] Dimple Caseyice[facility] - Nephrology sent off multiple biomarkers: *Of note C4, and Kappa light chains* were elevated minimally but otherwise ANCA, ANA, RF, anti-dsDNA, c3, spep/sflc without abnormalities, hepatitis panel, HIV, EBV, CMV were unremarkable - Cryos still pending  - Continue prednisone 20mg  qAM and 15mg  qPM - continue to wean to baseline level (5mg  daily)    Secondary  adrenal insufficiency  -Continue steroids    Current chronic use of systemic steroids -On 5 mg daily at baseline.  Continue to wean   Acute hypoxic respiratory failure, POA -Multifactorial in the setting of metabolic encephalopathy, presumed pneumonia, granulomatosis/polyangiitis, concurrent COVID-19 infection -Potentially exacerbated by hypervolemia surrounding renal failure -Resolved and remains on room air   Presumed severe sepsis secondary to pneumonia versus UTI -Completed 5 days of antibiotic therapy   C. difficile colonization -C. difficile toxin is negative.  No indication of treatment at this time   GERD  -PPI    Rhabdomyolysis -Secondary to prolonged downtime -Improved   Elevated transaminitis -Most likely in the setting of sepsis/shock liver and rhabdomyolysis. -Improved   History of pulmonary embolism  -Lovenox on hold due to recurrent anemia requiring blood transfusion.   COVID-19 virus infection, questionably incidental - COVID PCR incidentally positive at time of admission, without respiratory symptoms - Remdesivir withheld due to elevated liver enzymes - Completed 10-day quarantine   Diabetes mellitus -A1c 8.6 -Blood sugar controlled, continue current dose of Semglee, sliding scale insulin   Obesity  Estimated body mass index is 38.37 kg/m as calculated from the following:   Height as of this encounter: 5\' 5"  (1.651 m).   Weight as of this encounter: 104.6 kg.  DVT prophylaxis: Place and maintain sequential compression device Start: 04/28/22 1251 avoiding heparin products due to continuous bleed requiring transfusion   Code Status: Full Code  Family Communication: Her best friend present at bedside.   Status is: Inpatient Remains inpatient appropriate because: Recurrent bleeding requiring blood transfusion and worsening creatinine requiring HD   Estimated body mass index is 37.46 kg/m as calculated from the following:   Height as of this  encounter: 5\' 5"  (1.651 m).  Weight as of this encounter: 102.1 kg.    Nutritional Assessment: Body mass index is 37.46 kg/m.Marland Kitchen Seen by dietician.  I agree with the assessment and plan as outlined below: Nutrition Status . Skin Assessment: I have examined the patient's skin and I agree with the wound assessment as performed by the wound care RN as outlined below:    Consultants:  Nephrology Neurology-signed off General surgery  Procedures:  As above  Antimicrobials:  Anti-infectives (From admission, onward)    Start     Dose/Rate Route Frequency Ordered Stop   04/21/22 2200  meropenem (MERREM) 500 mg in sodium chloride 0.9 % 100 mL IVPB  Status:  Discontinued        500 mg 200 mL/hr over 30 Minutes Intravenous Every 12 hours 04/21/22 1507 04/22/22 1945   04/21/22 1000  fluconazole (DIFLUCAN) tablet 100 mg  Status:  Discontinued        100 mg Oral Daily 04/20/22 0952 04/22/22 1945   04/20/22 1500  meropenem (MERREM) 1 g in sodium chloride 0.9 % 100 mL IVPB  Status:  Discontinued        1 g 200 mL/hr over 30 Minutes Intravenous Every 12 hours 04/20/22 1408 04/21/22 1507   04/20/22 1045  fluconazole (DIFLUCAN) tablet 200 mg        200 mg Oral  Once 04/20/22 0952 04/20/22 1207   04/20/22 1021  ceFEPIme (MAXIPIME) 2 g in sodium chloride 0.9 % 100 mL IVPB  Status:  Discontinued        2 g 200 mL/hr over 30 Minutes Intravenous Every 24 hours 04/19/22 1355 04/20/22 1334   04/19/22 1100  linezolid (ZYVOX) IVPB 600 mg  Status:  Discontinued        600 mg 300 mL/hr over 60 Minutes Intravenous Every 12 hours 04/19/22 1014 04/22/22 1945   04/18/22 1200  vancomycin (VANCOREADY) IVPB 750 mg/150 mL  Status:  Discontinued        750 mg 150 mL/hr over 60 Minutes Intravenous Every 24 hours 04/17/22 1306 04/19/22 1014   04/17/22 2200  ceFEPIme (MAXIPIME) 2 g in sodium chloride 0.9 % 100 mL IVPB  Status:  Discontinued        2 g 200 mL/hr over 30 Minutes Intravenous Every 12 hours 04/17/22  1306 04/19/22 1355   04/17/22 1300  vancomycin (VANCOREADY) IVPB 2000 mg/400 mL        2,000 mg 200 mL/hr over 120 Minutes Intravenous  Once 04/17/22 1208 04/17/22 1729   04/17/22 1200  ceFEPIme (MAXIPIME) 2 g in sodium chloride 0.9 % 100 mL IVPB        2 g 200 mL/hr over 30 Minutes Intravenous  Once 04/17/22 1153 04/17/22 1330   04/17/22 1200  metroNIDAZOLE (FLAGYL) IVPB 500 mg        500 mg 100 mL/hr over 60 Minutes Intravenous  Once 04/17/22 1153 04/17/22 1435   04/17/22 1200  vancomycin (VANCOCIN) IVPB 1000 mg/200 mL premix  Status:  Discontinued        1,000 mg 200 mL/hr over 60 Minutes Intravenous  Once 04/17/22 1153 04/17/22 1208         Subjective:  Patient seen and examined.  Basford at the bedside.  Patient has turned 180 degree, she is very alert and oriented and talkative today.  She has no complaints.  Objective: Vitals:   05/02/22 2215 05/03/22 0025 05/03/22 0450 05/03/22 0831  BP: (!) 160/96 (!) 157/91 (!) 160/86 140/75  Pulse:  92  Resp: (!) 24 20 15 20   Temp:  98.2 F (36.8 C) 98 F (36.7 C) 98.7 F (37.1 C)  TempSrc:  Oral Oral Oral  SpO2: 99%   98%  Weight:      Height:       No intake or output data in the 24 hours ending 05/03/22 1413  Filed Weights   04/26/22 1914 04/28/22 1645 05/02/22 0421  Weight: 105.2 kg 100.7 kg 102.1 kg    Examination:  General exam: Appears calm and comfortable  Respiratory system: Clear to auscultation. Respiratory effort normal. Cardiovascular system: S1 & S2 heard, RRR. No JVD, murmurs, rubs, gallops or clicks.  +3 pitting edema bilateral lower extremity. Gastrointestinal system: Abdomen is nondistended, soft and nontender. No organomegaly or masses felt. Normal bowel sounds heard. Central nervous system: Alert and oriented. No focal neurological deficits. Extremities: Symmetric 5 x 5 power. Skin: No rashes, lesions or ulcers.  Psychiatry: Judgement and insight appear normal. Mood & affect appropriate.    Data  Reviewed: I have personally reviewed following labs and imaging studies  CBC: Recent Labs  Lab 05/01/22 0630 05/01/22 1101 05/02/22 0636 05/02/22 2220 05/03/22 0450  WBC 19.3* 21.1* 17.6* 17.5* 17.2*  NEUTROABS 17.9* 17.4* 15.8* 16.3* 12.5*  HGB 7.0* 8.9* 8.5* 8.8* 8.0*  HCT 20.5* 25.8* 24.7* 25.6* 23.5*  MCV 82.3 83.0 82.6 83.9 83.9  PLT 213 214 199 221 194    Basic Metabolic Panel: Recent Labs  Lab 04/29/22 0455 04/30/22 0145 05/01/22 0630 05/02/22 0636 05/03/22 0450  NA 136 137 134* 138 137  K 3.7 4.3 4.0 3.9 4.1  CL 98 96* 97* 100 104  CO2 27 25 25 28 25   GLUCOSE 136* 162* 133* 136* 132*  BUN 36* 49* 62* 35* 47*  CREATININE 3.11* 3.69* 4.02*  4.11* 2.43* 2.81*  CALCIUM 7.4* 7.6* 7.3* 7.2* 7.4*  PHOS 4.9* 5.8* 6.2* 3.8 4.4    GFR: Estimated Creatinine Clearance: 26.5 mL/min (A) (by C-G formula based on SCr of 2.81 mg/dL (H)). Liver Function Tests: Recent Labs  Lab 04/27/22 0215 04/28/22 0400 04/29/22 0455 04/30/22 0145 05/01/22 0630 05/02/22 0636 05/03/22 0450  AST 54* 52*  --   --   --   --   --   ALT 40 34  --   --   --   --   --   ALKPHOS 54 44  --   --   --   --   --   BILITOT 1.0 1.1  --   --   --   --   --   PROT 4.1* 4.1*  --   --   --   --   --   ALBUMIN 1.6*  1.6* 1.7*  1.7* 1.7* 1.8* 1.8* 1.8* 2.1*    No results for input(s): "LIPASE", "AMYLASE" in the last 168 hours. No results for input(s): "AMMONIA" in the last 168 hours. Coagulation Profile: No results for input(s): "INR", "PROTIME" in the last 168 hours. Cardiac Enzymes: Recent Labs  Lab 04/27/22 0215 04/28/22 0400  CKTOTAL 1,035* 1,772*    BNP (last 3 results) No results for input(s): "PROBNP" in the last 8760 hours. HbA1C: No results for input(s): "HGBA1C" in the last 72 hours. CBG: Recent Labs  Lab 05/02/22 1303 05/02/22 1754 05/02/22 2201 05/03/22 0621 05/03/22 1240  GLUCAP 111* 202* 188* 114* 91    Lipid Profile: No results for input(s): "CHOL", "HDL",  "LDLCALC", "TRIG", "CHOLHDL", "LDLDIRECT" in the last 72 hours.  Thyroid Function Tests: No results for input(s): "TSH", "T4TOTAL", "FREET4", "T3FREE", "THYROIDAB" in the last 72 hours. Anemia Panel: No results for input(s): "VITAMINB12", "FOLATE", "FERRITIN", "TIBC", "IRON", "RETICCTPCT" in the last 72 hours.  Sepsis Labs: No results for input(s): "PROCALCITON", "LATICACIDVEN" in the last 168 hours.  Recent Results (from the past 240 hour(s))  Gastrointestinal Panel by PCR , Stool     Status: None   Collection Time: 04/23/22  3:55 PM   Specimen: Stool  Result Value Ref Range Status   Campylobacter species NOT DETECTED NOT DETECTED Final   Plesimonas shigelloides NOT DETECTED NOT DETECTED Final   Salmonella species NOT DETECTED NOT DETECTED Final   Yersinia enterocolitica NOT DETECTED NOT DETECTED Final   Vibrio species NOT DETECTED NOT DETECTED Final   Vibrio cholerae NOT DETECTED NOT DETECTED Final   Enteroaggregative E coli (EAEC) NOT DETECTED NOT DETECTED Final   Enteropathogenic E coli (EPEC) NOT DETECTED NOT DETECTED Final   Enterotoxigenic E coli (ETEC) NOT DETECTED NOT DETECTED Final   Shiga like toxin producing E coli (STEC) NOT DETECTED NOT DETECTED Final   Shigella/Enteroinvasive E coli (EIEC) NOT DETECTED NOT DETECTED Final   Cryptosporidium NOT DETECTED NOT DETECTED Final   Cyclospora cayetanensis NOT DETECTED NOT DETECTED Final   Entamoeba histolytica NOT DETECTED NOT DETECTED Final   Giardia lamblia NOT DETECTED NOT DETECTED Final   Adenovirus F40/41 NOT DETECTED NOT DETECTED Final   Astrovirus NOT DETECTED NOT DETECTED Final   Norovirus GI/GII NOT DETECTED NOT DETECTED Final   Rotavirus A NOT DETECTED NOT DETECTED Final   Sapovirus (I, II, IV, and V) NOT DETECTED NOT DETECTED Final    Comment: Performed at [facility], [address] [place], [place] [address]  C Difficile Quick Screen (NO PCR Reflex)     Status: Abnormal   Collection Time: 04/23/22   3:57 PM   Specimen: Stool  Result Value Ref Range Status   C Diff antigen POSITIVE (A) NEGATIVE Final   C Diff toxin NEGATIVE NEGATIVE Final   C Diff interpretation   Final    Results are indeterminate. Please contact the provider listed for your campus for C diff questions in AMION.    Comment: Performed at [facility], [address], [place], [place] [address]     Radiology Studies: No results found.  Scheduled Meds:  amLODipine  10 mg Oral Daily   Chlorhexidine Gluconate Cloth  6 each Topical Daily   DULoxetine  30 mg Oral Daily   folic acid  2 mg Oral Daily   guaiFENesin-dextromethorphan  10 mL Oral Q12H   heparin sodium (porcine)  2,600 Units Intravenous Once   insulin aspart  0-9 Units Subcutaneous TID WC   insulin glargine-yfgn  8 Units Subcutaneous QHS   linaclotide  145 mcg Oral QAC breakfast   pantoprazole  40 mg Oral Daily   predniSONE  15 mg Oral Q supper   predniSONE  20 mg Oral Q breakfast   senna  2 tablet Oral BID   vitamin B-12  1,000 mcg Oral Daily   Continuous Infusions:    LOS: 16 days   Hughie Closs, MD [facility]  05/03/2022, 2:13 PM   *Please note that this is a verbal dictation therefore any spelling or grammatical errors are due to the "Dragon Medical One" system interpretation.  Please page via Amion and do not message via secure chat for urgent patient care matters. Secure chat can be used for non urgent patient  care matters.  How to contact the [facility] Attending or Consulting provider 7A - 7P or covering provider during after hours 7P -7A, for this patient?  Check the care team in [facility] and look for a) attending/consulting TRH provider listed and b) the [facility] team listed. Page or secure chat 7A-7P. Log into [email or web address] and use [place]'s universal password to access. If you do not have the password, please contact the hospital operator. Locate the [facility] provider you are looking for under [facility] and page to a number that  you can be directly reached. If you still have difficulty reaching the provider, please page the [facility] (Director on Call) for the Hospitalists listed on amion for assistance.

## 2022-05-03 NOTE — Progress Notes (Signed)
Physical Therapy Treatment Patient Details Name: [name] MRN: [id number] DOB: 1965/12/22 Today's Date: 05/03/2022   History of Present Illness Pt is a 56 y.o. female admitted 6/26 after being found by her husband on the floor at home with AMS. Admitted with dx of acute metabolic encephalopathy. Incidental COVID+ on admission. Initially admitted to [facility] and transferred to [facility] 7/1 for initiation of HD. Multiple recent hospitalizations, 4/3-5/4 and 6/4-6/14. PMH: PE, Wegener's disease, CKD 3, DM, HTN, chronic pain with an intrathecal pump, OSA, bilat mid-foot amputations, peripheral neuropathy    PT Comments    Pt supine in bed on arrival.  She is eager to mobilize and sit in recliner.  PTA used sara stedy as this was her first attempt into standing.  Pt required max +2 and increased time but able to rise into standing.  Continue to recommend rehab in a post acute setting.     Recommendations for follow up therapy are one component of a multi-disciplinary discharge planning process, led by the attending physician.  Recommendations may be updated based on patient status, additional functional criteria and insurance authorization.  Follow Up Recommendations  Skilled nursing-short term rehab (<3 hours/day)     Assistance Recommended at Discharge Frequent or constant Supervision/Assistance  Patient can return home with the following Two people to help with walking and/or transfers;Assistance with cooking/housework;Assist for transportation;Two people to help with bathing/dressing/bathroom;Help with stairs or ramp for entrance   Equipment Recommendations  Other (comment) (hoyer lift)    Recommendations for Other Services       Precautions / Restrictions Precautions Precautions: Fall;Other (comment) Precaution Comments: h/o bilateral transmet amputations, foley catheter, flexiseal, central line Restrictions Weight Bearing Restrictions: No     Mobility  Bed Mobility Overal bed  mobility: Needs Assistance Bed Mobility: Supine to Sit     Supine to sit: Min assist, Mod assist     General bed mobility comments: Min assistance to move edge of bed, Once in sitting required assistance to scoot forward.    Transfers Overall transfer level: Needs assistance Equipment used: Ambulation equipment used (sara stedy) Transfers: Sit to/from Stand Sit to Stand: Max assist, +2 physical assistance           General transfer comment: Cues for hand placement and assistance to boost into standing from edge of bed.   Pt required cues for sequencing, hip extension and trunk extension.  Pt fatigues quickly and presents with poor eccentric load.    Ambulation/Gait                   Stairs             Wheelchair Mobility    Modified Rankin (Stroke Patients Only)       Balance Overall balance assessment: Needs assistance Sitting-balance support: No upper extremity supported Sitting balance-Leahy Scale: Fair Sitting balance - Comments: Balance much improved this session.     Standing balance-Leahy Scale: Poor Standing balance comment: Heavy external assistance to achieve and maintain standing.                            Cognition Arousal/Alertness: Awake/alert Behavior During Therapy: Flat affect Overall Cognitive Status: Difficult to assess                                 General Comments: Pt more talkative with improved mobility this session.  Exercises      General Comments        Pertinent Vitals/Pain Pain Assessment Pain Assessment: Faces Faces Pain Scale: Hurts whole lot Pain Location: generalized Pain Descriptors / Indicators: Discomfort, Grimacing, Guarding Pain Intervention(s): Monitored during session, Repositioned    Home Living                          Prior Function            PT Goals (current goals can now be found in the care plan section) Acute Rehab PT Goals Patient  Stated Goal: home Potential to Achieve Goals: Good Progress towards PT goals: Progressing toward goals    Frequency    Min 3X/week      PT Plan Current plan remains appropriate    Co-evaluation              AM-PAC PT "6 Clicks" Mobility   Outcome Measure  Help needed turning from your back to your side while in a flat bed without using bedrails?: A Little Help needed moving from lying on your back to sitting on the side of a flat bed without using bedrails?: A Little Help needed moving to and from a bed to a chair (including a wheelchair)?: Total Help needed standing up from a chair using your arms (e.g., wheelchair or bedside chair)?: A Lot Help needed to walk in hospital room?: Total Help needed climbing 3-5 steps with a railing? : Total 6 Click Score: 11    End of Session Equipment Utilized During Treatment: Gait belt Activity Tolerance: Patient limited by pain Patient left: with call bell/phone within reach;in chair (with lift pad under patient bottom.) Nurse Communication: Mobility status PT Visit Diagnosis: Muscle weakness (generalized) (M62.81);Other abnormalities of gait and mobility (R26.89)     Time: 1040-1051 PT Time Calculation (min) (ACUTE ONLY): 11 min  Charges:  $Therapeutic Activity: 8-22 mins                     Bonney Leitz , PTA Acute Rehabilitation Services Office [phone]    Brodey Bonn Artis Delay 05/03/2022, 1:21 PM

## 2022-05-03 NOTE — Progress Notes (Signed)
Patient ID: [name], female   DOB: [birth year], 56 y.o.   MRN: [id number] [facility] Progress Note   Assessment/ Plan:   1. Acute kidney Injury: Normal renal function at baseline now with AKI in setting of UTI.  Etiology though to be hemodynamically mediated in setting of UTI and NOT suspected to be related to her history of ANCA associated GPA based on recent rituximab administration in May and urinalysis not suggestive of GN but rather UTI.  Repeated UA 7/11 - looks consistent with foley specimen not GN.  Weighing risk:benefit of renal biopsy that I think would be low yield I recommend against a renal biopsy at this time.   She underwent HD on early AM 7/11 for possible uremic symptoms - less talkative/interactive.  BUN was only in 60s but family feels significantly improved though with so many confounding factors difficult to know.  She looks even better today and BUN in the 40s now - no dialysis.   Will d/c foley catheter and place pure wick.  Still need I/Os.  Will check PVRs.   2.  Anemia: Previously with some bleeding around her dialysis catheter and some transient hematochezia.  Status post PRBC transfusions, CT 7/10 with gluteal hematoma for which surgery was consulted and is observing for now.  Hb in 8s today.  3.  Altered mental status: Suspected to be multifactorial etiology with lumbar puncture deferred after improvement of mental status with dialysis and reduction of baclofen rate.  MS again worse and had HD 7/11 - improvement noted by family and she's further improved today so I don't know that it was purely dialysis that helped. 4.  Sepsis: Suspected to be of pneumonia/urinary tract infection etiology and status post completion of antibiotics. 5.  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  Dx 15y ago = on chronic methotrexate/rituximab and pred ( sees Dr. Talbert Cage with  rheumatology). Per above doubt renal GPA but rechecking UA today.  6.  COVID-19 virus infection: Incidental positive COVID PCR found at admission.  Remains on corticosteroids and did not get remdesivir due to elevated LFTs. 7.  Adrenal insufficiency  Will continue to follow.  Needs PT/OT eval moving towards dispo.  If her kidney function remains stable/improved in the coming days I think she'll be ok to discharge from our perspective.  Will continue to follow.  Subjective:   Feeling a lot better today - much more alert and interactive.  No new issues.  Asking about dispo timeline.  I/Os not recorded - says good UOP via foley   Objective:   BP 140/75 (BP Location: Left Arm)   Pulse 92   Temp 98.7 F (37.1 C) (Oral)   Resp 20   Ht 5\' 5"  (1.651 m)   Wt 102.1 kg   SpO2 98%   BMI 37.46 kg/m  No intake or output data in the 24 hours ending 05/03/22 0938  Weight change:   Physical Exam: Gen: Appears comfortable resting in bed, family member standing at bedside  CVS: Pulse regular rhythm, normal rate, S1 and S2 normal Resp: Transmitted breath sounds bilaterally without rales/rhonchi Abd: Soft, obese, nontender, bowel sounds normal Ext: Trace lower extremity edema.  Bilateral forefoot amputations noted Neuro: speaking and answering questions today GU :foley draining clear yellow urine  Imaging: No results found.  Labs: BMET Recent Labs  Lab 04/27/22 0215 04/28/22 0400 04/29/22 0455 04/30/22 0145 05/01/22 0630 05/02/22  0636 05/03/22 0450  NA 135 138 136 137 134* 138 137  K 4.0 3.8 3.7 4.3 4.0 3.9 4.1  CL 100 97* 98 96* 97* 100 104  CO2 27 25 27 25 25 28 25   GLUCOSE 149* 160* 136* 162* 133* 136* 132*  BUN 34* 49* 36* 49* 62* 35* 47*  CREATININE 3.53* 4.30* 3.11* 3.69* 4.02*  4.11* 2.43* 2.81*  CALCIUM 7.0* 7.4* 7.4* 7.6* 7.3* 7.2* 7.4*  PHOS 5.4* 6.2* 4.9* 5.8* 6.2* 3.8 4.4    CBC Recent Labs  Lab 05/01/22 1101 05/02/22 0636 05/02/22 2220 05/03/22 0450  WBC  21.1* 17.6* 17.5* 17.2*  NEUTROABS 17.4* 15.8* 16.3* 12.5*  HGB 8.9* 8.5* 8.8* 8.0*  HCT 25.8* 24.7* 25.6* 23.5*  MCV 83.0 82.6 83.9 83.9  PLT 214 199 221 194     Medications:     amLODipine  10 mg Oral Daily   Chlorhexidine Gluconate Cloth  6 each Topical Daily   DULoxetine  30 mg Oral Daily   folic acid  2 mg Oral Daily   guaiFENesin-dextromethorphan  10 mL Oral Q12H   heparin sodium (porcine)  2,600 Units Intravenous Once   insulin aspart  0-9 Units Subcutaneous TID WC   insulin glargine-yfgn  8 Units Subcutaneous QHS   linaclotide  145 mcg Oral QAC breakfast   pantoprazole  40 mg Oral Daily   predniSONE  15 mg Oral Q supper   predniSONE  20 mg Oral Q breakfast   senna  2 tablet Oral BID   vitamin B-12  1,000 mcg Oral Daily  07/04/22 MD [facility] Pager [phone]

## 2022-05-04 DIAGNOSIS — G9341 Metabolic encephalopathy: Secondary | ICD-10-CM | POA: Diagnosis not present

## 2022-05-04 LAB — CBC
HCT: 23.3 % — ABNORMAL LOW (ref 36.0–46.0)
Hemoglobin: 7.9 g/dL — ABNORMAL LOW (ref 12.0–15.0)
MCH: 28.7 pg (ref 26.0–34.0)
MCHC: 33.9 g/dL (ref 30.0–36.0)
MCV: 84.7 fL (ref 80.0–100.0)
Platelets: 198 10*3/uL (ref 150–400)
RBC: 2.75 MIL/uL — ABNORMAL LOW (ref 3.87–5.11)
RDW: 18.1 % — ABNORMAL HIGH (ref 11.5–15.5)
WBC: 15 10*3/uL — ABNORMAL HIGH (ref 4.0–10.5)
nRBC: 0 % (ref 0.0–0.2)

## 2022-05-04 LAB — TYPE AND SCREEN
ABO/RH(D): O POS
Antibody Screen: NEGATIVE
Unit division: 0
Unit division: 0

## 2022-05-04 LAB — RENAL FUNCTION PANEL
Albumin: 2.2 g/dL — ABNORMAL LOW (ref 3.5–5.0)
Anion gap: 10 (ref 5–15)
BUN: 57 mg/dL — ABNORMAL HIGH (ref 6–20)
CO2: 22 mmol/L (ref 22–32)
Calcium: 7.6 mg/dL — ABNORMAL LOW (ref 8.9–10.3)
Chloride: 109 mmol/L (ref 98–111)
Creatinine, Ser: 3 mg/dL — ABNORMAL HIGH (ref 0.44–1.00)
GFR, Estimated: 18 mL/min — ABNORMAL LOW (ref >60)
Glucose, Bld: 145 mg/dL — ABNORMAL HIGH (ref 70–99)
Phosphorus: 4.8 mg/dL — ABNORMAL HIGH (ref 2.5–4.6)
Potassium: 4.4 mmol/L (ref 3.5–5.1)
Sodium: 141 mmol/L (ref 135–145)

## 2022-05-04 LAB — BPAM RBC
Blood Product Expiration Date: 202308072359
Blood Product Expiration Date: 202308092359
ISSUE DATE / TIME: 202307100821
Unit Type and Rh: 5100
Unit Type and Rh: 5100

## 2022-05-04 LAB — GLUCOSE, CAPILLARY
Glucose-Capillary: 119 mg/dL — ABNORMAL HIGH (ref 70–99)
Glucose-Capillary: 121 mg/dL — ABNORMAL HIGH (ref 70–99)
Glucose-Capillary: 172 mg/dL — ABNORMAL HIGH (ref 70–99)
Glucose-Capillary: 216 mg/dL — ABNORMAL HIGH (ref 70–99)

## 2022-05-04 MED ORDER — MELATONIN 5 MG PO TABS
5.0000 mg | ORAL_TABLET | Freq: Every evening | ORAL | Status: DC | PRN
  Administered 2022-05-08 – 2022-05-17 (×8): 5 mg via ORAL
  Filled 2022-05-04 (×10): qty 1

## 2022-05-04 MED ORDER — DICLOFENAC SODIUM 1 % EX GEL
2.0000 g | Freq: Four times a day (QID) | CUTANEOUS | Status: DC
Start: 2022-05-04 — End: 2022-05-18
  Administered 2022-05-04 – 2022-05-18 (×54): 2 g via TOPICAL
  Filled 2022-05-04 (×2): qty 100

## 2022-05-04 NOTE — Progress Notes (Signed)
Speech Language Pathology Treatment: Dysphagia  Patient Details Name: [name] MRN: [id number] DOB: 27-Nov-1965 Today's Date: 05/04/2022 Time: 4784-1282 SLP Time Calculation (min) (ACUTE ONLY): 27 min  Assessment / Plan / Recommendation Clinical Impression  Pt seen for dysphagia f/u session with improved vocal strength/intensity during simple conversation as pt was previously aphonic/whispering prior tx session.  Pt consumed regular, thin liquids, and nectar-thickened liquids during PO trial without overt s/s of aspiration present.  Slow mastication efforts noted without oral residue.  Discussed safety precautions with utilizing small sips with thin (with straw allowed), slow rate and small bites of foods during consumption d/t recent deconditioning and MBS results.  Pt and family in agreement.  ST will continue to f/u for diet tolerance during acute stay to continue assessing diet tolerance with meals.    HPI HPI: 56 y.o. female with medical history significant of Wegener's angiitis, chronic opioid dependency, hyperlipidemia, hypertension, impaired glucose tolerance, adrenal insufficiency on chronic prednisone usage, rheumatoid arthritis and multiple admissions secondary to sepsis in the setting of pneumonia and UTI.  Presented to the hospital [facility] on 04/17/22 with complaints of intermittent altered mental status changes at home and increased weakness.  Symptoms have been present for the last 2-3 days and worsening according to patient's husband.  Patient has just completed her antibiotic therapy from most recent admission about 1 week prior to presentation.  In the ED patient met criteria for severe sepsis with elevated WBCs, elevated lactic acid in the 4.7 range, acute renal failure as organ dysfunction and was also tachycardic and tachypneic;Subsequently found to be Covid+; transferred to [facility] on 04/22/22; ST evaluation on 04/21/22 indicating need for Dysphagia 2/honey-thickened liquids: CXR  on 04/17/22 indicated Progressive interstitial pattern left lung and right upper lobe.  This may represent edema or infection; ST completed MBS on 04/28/22 with recommendation for Dysphagia 3/nectar-thickened liquids.  ST f/u for diet tolerance.      SLP Plan  Continue with current plan of care;Goals updated      Recommendations for follow up therapy are one component of a multi-disciplinary discharge planning process, led by the attending physician.  Recommendations may be updated based on patient status, additional functional criteria and insurance authorization.    Recommendations  Diet recommendations: Dysphagia 3 (mechanical soft);Thin liquid Liquids provided via: Cup;No straw Medication Administration: Whole meds with liquid Supervision: Patient able to self feed;Intermittent supervision to cue for compensatory strategies Compensations: Slow rate;Small sips/bites Postural Changes and/or Swallow Maneuvers: Seated upright 90 degrees                General recommendations: Other(comment) (TBD) Oral Care Recommendations: Oral care BID;Patient independent with oral care Follow Up Recommendations: Follow physician's recommendations for discharge plan and follow up therapies Assistance recommended at discharge: Intermittent Supervision/Assistance SLP Visit Diagnosis: Dysphagia, oropharyngeal phase (R13.12) Plan: Continue with current plan of care;Goals updated           Elvina Sidle, M.S., CCC-SLP 05/04/2022, 10:10 AM

## 2022-05-04 NOTE — Plan of Care (Signed)

## 2022-05-04 NOTE — Progress Notes (Signed)
Patient ID: [name], female   DOB: 09/07/1966, 56 y.o.   MRN: [id number] [facility] Progress Note   Assessment/ Plan:   1. Acute kidney Injury: Normal renal function at baseline now with AKI in setting of UTI.  Etiology though to be hemodynamically mediated in setting of UTI and NOT suspected to be related to her history of ANCA associated GPA based on recent rituximab administration in May and urinalysis not suggestive of GN but rather UTI.  Repeated UA 7/11 - looks consistent with foley specimen not GN.  Weighing risk:benefit of renal biopsy that I think would be low yield I recommend against a renal biopsy at this time.   She underwent HD on early AM 7/11 for possible uremic symptoms - less talkative/interactive.  BUN was only in 60s but family feels significantly improved though with so many confounding factors difficult to know.  She looked even better yesterday with BUN in the 40s.  Son concerned for developing uremia today.  BUN 57 from 47 and had robaxin which I think accounts for dec MS.  Agree to hold on HD today and re eval tomorrow.  D/c'd robaxin.   Foley out and has purewick.  Still need I/Os.  Will check PVRs.  D/w RN today - no I/Os captured yesterday.   2.  Anemia: Previously with some bleeding around her dialysis catheter and some transient hematochezia.  Status post PRBC transfusions, CT 7/10 with gluteal hematoma for which surgery was consulted and is observing for now.  Hb 7.9 today from 8.1, transfuse < 7.  3.  Altered mental status: Suspected to be multifactorial etiology with lumbar puncture deferred after improvement of mental status with dialysis and reduction of baclofen rate.  MS again worse and had HD 7/11 - improvement noted by family and she's further improved today so I don't know that it was purely dialysis that helped.  Per above robaxin overnight and now drowsy - d/cd robaxin. 4.  Sepsis: Suspected to be of pneumonia/urinary tract infection etiology  and status post completion of antibiotics. 5.  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  Dx 15y ago = on chronic methotrexate/rituximab and pred ( sees Dr. Talbert Cage with rheumatology). Per above doubt renal GPA and no plans for renal biopsy. .  6.  COVID-19 virus infection: Incidental positive COVID PCR found at admission.  Remains on corticosteroids and did not get remdesivir due to elevated LFTs. 7.  Adrenal insufficiency  Will continue to follow.  Needs PT/OT eval moving towards dispo.  If her kidney function remains stable/improved in the coming days I think she'll be ok to discharge from our perspective.  Will continue to follow.  Subjective:   Son and other fam member bedside Great day yesterday, mental status good all day Had robaxin for back pain last PM 1st dose - now arousable but less interactive.  Did have SLP eval this AM and did good - no thickened liquids needed now.   Objective:   BP 136/73 (BP Location: Right Arm)   Pulse 85   Temp 98.1 F (36.7 C) (Oral)   Resp 12   Ht 5\' 5"  (1.651 m)   Wt 102.1 kg   SpO2 95%   BMI 37.46 kg/m   Intake/Output Summary (Last 24 hours) at 05/04/2022 1130 Last data filed at 05/04/2022 0900 Gross per 24 hour  Intake 240 ml  Output 1600 ml  Net -  1360 ml    Weight change:   Physical Exam: Gen: Appears comfortable resting in bed but drowsy, family members sitting at bedside  CVS: Pulse regular rhythm, normal rate, S1 and S2 normal Resp: Transmitted breath sounds bilaterally without rales/rhonchi Abd: Soft, obese, nontender, bowel sounds normal Ext: Trace lower extremity edema.  Bilateral forefoot amputations noted Neuro: speaking and answering questions today GU :purewick with yellow clear urine  Imaging: No results found.  Labs: BMET Recent Labs  Lab 04/28/22 0400 04/29/22 0455 04/30/22 0145 05/01/22 0630 05/02/22 0636  05/03/22 0450 05/04/22 0500  NA 138 136 137 134* 138 137 141  K 3.8 3.7 4.3 4.0 3.9 4.1 4.4  CL 97* 98 96* 97* 100 104 109  CO2 25 27 25 25 28 25 22   GLUCOSE 160* 136* 162* 133* 136* 132* 145*  BUN 49* 36* 49* 62* 35* 47* 57*  CREATININE 4.30* 3.11* 3.69* 4.02*  4.11* 2.43* 2.81* 3.00*  CALCIUM 7.4* 7.4* 7.6* 7.3* 7.2* 7.4* 7.6*  PHOS 6.2* 4.9* 5.8* 6.2* 3.8 4.4 4.8*    CBC Recent Labs  Lab 05/01/22 1101 05/02/22 0636 05/02/22 2220 05/03/22 0450 05/04/22 0951  WBC 21.1* 17.6* 17.5* 17.2* 15.0*  NEUTROABS 17.4* 15.8* 16.3* 12.5*  --   HGB 8.9* 8.5* 8.8* 8.0* 7.9*  HCT 25.8* 24.7* 25.6* 23.5* 23.3*  MCV 83.0 82.6 83.9 83.9 84.7  PLT 214 199 221 194 198     Medications:     amLODipine  10 mg Oral Daily   Chlorhexidine Gluconate Cloth  6 each Topical Daily   DULoxetine  30 mg Oral Daily   folic acid  2 mg Oral Daily   guaiFENesin-dextromethorphan  10 mL Oral Q12H   insulin aspart  0-9 Units Subcutaneous TID WC   insulin glargine-yfgn  8 Units Subcutaneous QHS   linaclotide  145 mcg Oral QAC breakfast   pantoprazole  40 mg Oral Daily   predniSONE  15 mg Oral Q supper   predniSONE  20 mg Oral Q breakfast   senna  2 tablet Oral BID   vitamin B-12  1,000 mcg Oral Daily  05/06/22 MD [facility] Pager [phone]

## 2022-05-04 NOTE — Progress Notes (Signed)
Occupational Therapy Treatment Patient Details Name: [name] MRN: [id number] DOB: [birth year] Today's Date: 05/04/2022   History of present illness Pt is a 56 y.o. female admitted 6/26 after being found by her husband on the floor at home with AMS. Admitted with dx of acute metabolic encephalopathy. Incidental COVID+ on admission. Initially admitted to [facility] and transferred to [facility] 7/1 for initiation of HD. Multiple recent hospitalizations, 4/3-5/4 and 6/4-6/14. PMH: PE, Wegener's disease, CKD 3, DM, HTN, chronic pain with an intrathecal pump, OSA, bilat mid-foot amputations, peripheral neuropathy   OT comments  Pt making incremental progress with OT goals this session. She was able to tolerate sitting EOB for >15 mins and complete grooming tasks, and dressing. Pt then attempted to stand x3, however continues to have difficulty pushing up to power up. Pt motivated to improve. OT continuing to recommend SNF at this time and OT will follow up acutely.    Recommendations for follow up therapy are one component of a multi-disciplinary discharge planning process, led by the attending physician.  Recommendations may be updated based on patient status, additional functional criteria and insurance authorization.    Follow Up Recommendations  Skilled nursing-short term rehab (<3 hours/day)    Assistance Recommended at Discharge Frequent or constant Supervision/Assistance  Patient can return home with the following  A lot of help with walking and/or transfers;A lot of help with bathing/dressing/bathroom;Assistance with cooking/housework;Assistance with feeding;Direct supervision/assist for medications management;Direct supervision/assist for financial management;Assist for transportation;Help with stairs or ramp for entrance   Equipment Recommendations  Other (comment) (TBD)    Recommendations for Other Services      Precautions / Restrictions Precautions Precautions: Fall;Other  (comment) Precaution Comments: h/o bilateral transmet amputations, foley catheter, flexiseal, central line Restrictions Weight Bearing Restrictions: No       Mobility Bed Mobility Overal bed mobility: Needs Assistance Bed Mobility: Supine to Sit, Sit to Supine, Rolling Rolling: Min assist   Supine to sit: Mod assist Sit to supine: Mod assist   General bed mobility comments: Min A from rolling in both directions, mod A to pull to sitting and to return BLE back into bed    Transfers Overall transfer level: Needs assistance Equipment used: Rolling walker (2 wheels) Transfers: Sit to/from Stand Sit to Stand: Total assist           General transfer comment: Attempted to stand x3 with RW, however pt continues to have difficulty pushing upwards. Unable to clear bottom at this time     Balance Overall balance assessment: Needs assistance Sitting-balance support: No upper extremity supported Sitting balance-Leahy Scale: Good     Standing balance support: Bilateral upper extremity supported Standing balance-Leahy Scale: Zero Standing balance comment: Unable to come to standing this session                           ADL either performed or assessed with clinical judgement   ADL Overall ADL's : Needs assistance/impaired     Grooming: Set up;Sitting Grooming Details (indicate cue type and reason): Pt washed her face and hands, and brushed her teeth EOB         Upper Body Dressing : Minimal assistance;Sitting Upper Body Dressing Details (indicate cue type and reason): changed hospital gowns EOB Lower Body Dressing: Maximal assistance;Sitting/lateral leans Lower Body Dressing Details (indicate cue type and reason): unable to put socks on however able to pull them up from ankles  General ADL Comments: Pt with improved mobility and cognition this session    Extremity/Trunk Assessment              Vision       Perception     Praxis       Cognition Arousal/Alertness: Awake/alert Behavior During Therapy: Flat affect Overall Cognitive Status: Difficult to assess                                 General Comments: Pt quiet this session, however following all commands        Exercises      Shoulder Instructions       General Comments VSS on RA    Pertinent Vitals/ Pain       Pain Assessment Pain Assessment: No/denies pain  Home Living                                          Prior Functioning/Environment              Frequency  Min 2X/week        Progress Toward Goals  OT Goals(current goals can now be found in the care plan section)  Progress towards OT goals: Progressing toward goals  Acute Rehab OT Goals Patient Stated Goal: To get stronger OT Goal Formulation: With patient/family Time For Goal Achievement: 05/14/22 Potential to Achieve Goals: Good ADL Goals Pt Will Perform Grooming: with set-up;sitting Pt Will Perform Lower Body Bathing: with min assist;sitting/lateral leans;sit to/from stand Pt Will Perform Lower Body Dressing: with min assist;sitting/lateral leans;sit to/from stand Pt Will Transfer to Toilet: with mod assist;ambulating Pt Will Perform Toileting - Clothing Manipulation and hygiene: with min assist;sitting/lateral leans;sit to/from stand  Plan Discharge plan remains appropriate;Frequency remains appropriate    Co-evaluation                 AM-PAC OT "6 Clicks" Daily Activity     Outcome Measure   Help from another person eating meals?: A Little Help from another person taking care of personal grooming?: A Little Help from another person toileting, which includes using toliet, bedpan, or urinal?: A Lot Help from another person bathing (including washing, rinsing, drying)?: A Lot Help from another person to put on and taking off regular upper body clothing?: A Little Help from another person to put on and taking off regular  lower body clothing?: A Lot 6 Click Score: 15    End of Session Equipment Utilized During Treatment: Gait belt;Rolling walker (2 wheels)  OT Visit Diagnosis: Unsteadiness on feet (R26.81);Other abnormalities of gait and mobility (R26.89);Muscle weakness (generalized) (M62.81)   Activity Tolerance Patient tolerated treatment well   Patient Left in bed;with call bell/phone within reach;with family/visitor present   Nurse Communication Mobility status        Time: 2409-7353 OT Time Calculation (min): 33 min  Charges: OT General Charges $OT Visit: 1 Visit OT Treatments $Self Care/Home Management : 8-22 mins $Therapeutic Activity: 8-22 mins  Bryer Cozzolino H., OTR/L Acute Rehabilitation  Rikayla Demmon Elane Bing Plume 05/04/2022, 6:39 PM

## 2022-05-04 NOTE — Progress Notes (Addendum)
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   Patient presents with worsening intermittent altered mental status and increased weakness.  She has been declining since approximately January, 6 months ago per husband.  She was recently admitted to our facility and treated for pneumonia with moderate improvement.  She was to continue on vancomycin through 04/08/2022 followed by infectious disease.  Bronchoscopy during that admission on 03/30/2022 shows Candida in the setting of ongoing antibiotics and urine culture ultimately grew yeast - deemed to be colonized and not active infection given fungitel was reassuringly low per ID discussion. Of note she is also had a recent diagnosis of pulmonary embolism on apixaban.   Patient admitted this hospitalization (on 04/17/22) with elevated white count, lactic acid, and acute renal failure in the setting of poor p.o. intake altered mental status tachycardia and tachypnea concerning for sepsis. Patient's infectious source was presumed to be pneumonia versus UTI - placed initially on broad-spectrum antibiotics of vancomycin and cefepime. Her sepsis criteria did not improve and her renal function continued to worsen in the setting of presumed sepsis and rhabdomyolysis (ATN). Nephrology following with worsening mental status, tremors/muscle spasms with metabolic derangements. Patient transferred to main campus for neurology evaluation, EEG ultimately unremarkable for seizures-her movement was considered to be musculoskeletal, somewhat choreiform in nature, holding off on LP per neuro given improvements with HD and intrathecal pump turned down (unable to be turned  off but can be turned to [facility]).  Assessment & Plan:   Principal Problem:   Acute metabolic encephalopathy Active Problems:   Acute cystitis without hematuria   Current chronic use of systemic steroids   Chronic pain   Secondary adrenal insufficiency (HCC)   AKI (acute kidney injury) (HCC)   Granulomatosis with polyangiitis (HCC)   GERD (gastroesophageal reflux disease)   Essential hypertension   Obesity (BMI 30-39.9)   COVID-19 virus infection   Severe sepsis (HCC)   Pulmonary embolism (HCC)   Transaminitis   Rhabdomyolysis  Acute metabolic encephalopathy, POA, multifactorial - Initially presumed to be secondary to sepsis/renal failure/hyponatremia  - Potential covid encephalitis, however spastic choreiform movements are not consistent with COVID encephalitis - MRI unremarkable for acute process  - EEG unremarkable for seizure - Discontinued all home CNS depressant medications - continued Cymbalta only - Mental status improving after initiating dialysis and discontinuation of intrathecal pain pump - Discussed with neurology.  Holding off on LP and CTA head at this time with improvement.  Neurology signed off 7/6   Acute blood loss anemia on anemia of chronic kidney disease, history of chronic iron deficiency anemia: Noted some bleeding from her dialysis catheter as well as minimal rectal bleeding with history of hemorrhoids and fissure - Transfused 2 unit packed red blood cell 7/6, 1 unit 7/7 and 1 unit on 04/29/2022 as well as 04/30/2022, and then 1 more unit on 1723.  CT abdomen and pelvis on 04/30/2022 shows right gluteus minimus and piriformis hematoma.  General surgery was consulted however per them, no indication for any surgery at the moment and they recommended continuing to transfuse as needed and signed off.  Finally her hemoglobin is fairly stable .  Monitor daily and transfuse if hemoglobin drops less than 7.  Please note following: - Followed  by [facility] with history  of chronic normocytic anemia as well as iron deficiency anemia with hemoglobin ranging from 8.9-13.6.  Has been getting outpatient iron infusion biweekly. - Followed by [facility].  Had normal colonoscopy 2014, flex sigmoidoscopy 2020 for rectal bleeding which revealed deep anal fissure and external hemorrhoids.  Underwent Botox, then lateral internal anal sphincterotomy.  EGD November 2022 was normal.  Colonoscopy revealed multiple diverticula, internal hemorrhoids, 3 polyps. Suspect this is likely due to either hemorrhoids/fissure/diverticula in setting of chronic iron deficiency and anemia of chronic disease.    Polypharmacy risk - Home medications include bupivacaine/zinconotide intrathecal baclofen pump, duloxetine, gabapentin, methocarbamol, nucynta, oxycodone, Topamax.   Chronic pain, neuropathy, and chronic opioid dependence  - Patient has intrathecal pump with bupivacaine and ziconotide - turned down to lowest setting 7/3 (cannot stop completely per tech) - Consider restarting low dose PO oxy vs nucynta    AKI/ATN currently requiring HD - Complicated and multifactorial etiology, possibly ATN per nephrology - Temp HD catheter initially placed 04/24/2022 - issues with bleeding, but no more.  Last hemodialysis 05/01/2022.  Patient's interaction and alertness overall improved after dialysis.  Creatinine and BUN jumping.  Patient's son concerned about uremia and interested in dialysis, nephrology holding off to that for now.   Granulomatosis with polyangiitis  - Patient receiving rituximab and methotrexate as an outpatient (most recently at [facility] in May 2023) - confirmed with rheumatology at [facility] (Dr. Dimple Casey) - Nephrology sent off multiple biomarkers: *Of note C4, and Kappa light chains* were elevated minimally but otherwise ANCA, ANA, RF, anti-dsDNA, c3, spep/sflc without abnormalities, hepatitis panel, HIV, EBV, CMV were unremarkable - Cryos still pending  - Continue prednisone 20mg  qAM and  15mg  qPM - continue to wean to baseline level (5mg  daily)    Secondary adrenal insufficiency  -Continue steroids    Current chronic use of systemic steroids -On 5 mg daily at baseline.  Continue to wean   Acute hypoxic respiratory failure, POA -Multifactorial in the setting of metabolic encephalopathy, presumed pneumonia, granulomatosis/polyangiitis, concurrent COVID-19 infection -Potentially exacerbated by hypervolemia surrounding renal failure -Resolved and remains on room air   Presumed severe sepsis secondary to pneumonia versus UTI -Completed 5 days of antibiotic therapy   C. difficile colonization -C. difficile toxin is negative.  No indication of treatment at this time   GERD  -PPI    Rhabdomyolysis -Secondary to prolonged downtime -Improved   Elevated transaminitis -Most likely in the setting of sepsis/shock liver and rhabdomyolysis. -Improved   History of pulmonary embolism  -Lovenox on hold due to recurrent anemia requiring blood transfusion.   COVID-19 virus infection, questionably incidental - COVID PCR incidentally positive at time of admission, without respiratory symptoms - Remdesivir withheld due to elevated liver enzymes - Completed 10-day quarantine   Diabetes mellitus -A1c 8.6 -Blood sugar controlled, continue current dose of Semglee, sliding scale insulin   Obesity  Estimated body mass index is 38.37 kg/m as calculated from the following:   Height as of this encounter: 5\' 5"  (1.651 m).   Weight as of this encounter: 104.6 kg.  DVT prophylaxis: Place and maintain sequential compression device Start: 04/28/22 1251 avoiding heparin products due to continuous bleed requiring transfusion   Code Status: Full Code  Family Communication: Her best friend present at bedside.   Addendum: Received a message from the nurse around 6:30 PM on 05/04/2022 that patient's husband was concerned and had a lot of questions and was requesting a phone call.  I  called  him, he expressed his concern about patient's care.  Mainly the fact that patient has declined over the course of last 2 days and that her creatinine and BUN is rising but nephrology did not do dialysis on 05/04/2022.  I answered several questions of the patient.  I reassured him that what ever nephrology is deciding, they are deciding with the intention to do the best for his wife.  After talking to him for 15 minutes, he was somewhat reassured.  We ended up I will call with the agreement that we will see how patient's numbers look tomorrow as far as renal function goes and that we will discuss with nephrology and I will convey his concerns to the nephrology directly.  Although nephrologist had a direct communication with the family, patient's son as well as best friend during the day who were at the bedside at that time.  I personally also had direct communication with them at bedside as well. Status is: Inpatient Remains inpatient appropriate because: Recurrent bleeding requiring blood transfusion and worsening creatinine requiring HD   Estimated body mass index is 37.46 kg/m as calculated from the following:   Height as of this encounter: 5\' 5"  (1.651 m).   Weight as of this encounter: 102.1 kg.    Nutritional Assessment: Body mass index is 37.46 kg/m. Seen by dietician.  I agree with the assessment and plan as outlined below: Nutrition Status . Skin Assessment: I have examined the patient's skin and I agree with the wound assessment as performed by the wound care RN as outlined below:    Consultants:  Nephrology Neurology-signed off General surgery  Procedures:  As above  Antimicrobials:  Anti-infectives (From admission, onward)    Start     Dose/Rate Route Frequency Ordered Stop   04/21/22 2200  meropenem (MERREM) 500 mg in sodium chloride 0.9 % 100 mL IVPB  Status:  Discontinued        500 mg 200 mL/hr over 30 Minutes Intravenous Every 12 hours 04/21/22 1507 04/22/22 1945    04/21/22 1000  fluconazole (DIFLUCAN) tablet 100 mg  Status:  Discontinued        100 mg Oral Daily 04/20/22 0952 04/22/22 1945   04/20/22 1500  meropenem (MERREM) 1 g in sodium chloride 0.9 % 100 mL IVPB  Status:  Discontinued        1 g 200 mL/hr over 30 Minutes Intravenous Every 12 hours 04/20/22 1408 04/21/22 1507   04/20/22 1045  fluconazole (DIFLUCAN) tablet 200 mg        200 mg Oral  Once 04/20/22 0952 04/20/22 1207   04/20/22 1021  ceFEPIme (MAXIPIME) 2 g in sodium chloride 0.9 % 100 mL IVPB  Status:  Discontinued        2 g 200 mL/hr over 30 Minutes Intravenous Every 24 hours 04/19/22 1355 04/20/22 1334   04/19/22 1100  linezolid (ZYVOX) IVPB 600 mg  Status:  Discontinued        600 mg 300 mL/hr over 60 Minutes Intravenous Every 12 hours 04/19/22 1014 04/22/22 1945   04/18/22 1200  vancomycin (VANCOREADY) IVPB 750 mg/150 mL  Status:  Discontinued        750 mg 150 mL/hr over 60 Minutes Intravenous Every 24 hours 04/17/22 1306 04/19/22 1014   04/17/22 2200  ceFEPIme (MAXIPIME) 2 g in sodium chloride 0.9 % 100 mL IVPB  Status:  Discontinued        2 g 200 mL/hr over 30 Minutes Intravenous  Every 12 hours 04/17/22 1306 04/19/22 1355   04/17/22 1300  vancomycin (VANCOREADY) IVPB 2000 mg/400 mL        2,000 mg 200 mL/hr over 120 Minutes Intravenous  Once 04/17/22 1208 04/17/22 1729   04/17/22 1200  ceFEPIme (MAXIPIME) 2 g in sodium chloride 0.9 % 100 mL IVPB        2 g 200 mL/hr over 30 Minutes Intravenous  Once 04/17/22 1153 04/17/22 1330   04/17/22 1200  metroNIDAZOLE (FLAGYL) IVPB 500 mg        500 mg 100 mL/hr over 60 Minutes Intravenous  Once 04/17/22 1153 04/17/22 1435   04/17/22 1200  vancomycin (VANCOCIN) IVPB 1000 mg/200 mL premix  Status:  Discontinued        1,000 mg 200 mL/hr over 60 Minutes Intravenous  Once 04/17/22 1153 04/17/22 1208         Subjective:  Patient seen and examined.  Son and pastor at the bedside.  Patient fully alert but less interactive  compared to yesterday.  She received Robaxin last night for back pain.  Objective: Vitals:   05/04/22 0012 05/04/22 0455 05/04/22 0828 05/04/22 1204  BP: 120/88 (!) 143/87 136/73 137/85  Pulse: 88 85 85 90  Resp: (!) 22  12 19   Temp: 97.8 F (36.6 C) 97.7 F (36.5 C) 98.1 F (36.7 C) 97.9 F (36.6 C)  TempSrc: Oral Oral Oral Oral  SpO2: 98% 99% 95% 99%  Weight:      Height:        Intake/Output Summary (Last 24 hours) at 05/04/2022 1411 Last data filed at 05/04/2022 0900 Gross per 24 hour  Intake 240 ml  Output 1600 ml  Net -1360 ml    Filed Weights   04/26/22 1914 04/28/22 1645 05/02/22 0421  Weight: 105.2 kg 100.7 kg 102.1 kg    Examination:  General exam: Appears calm and comfortable  Respiratory system: Clear to auscultation. Respiratory effort normal. Cardiovascular system: S1 & S2 heard, RRR. No JVD, murmurs, rubs, gallops or clicks.  +3 pitting edema bilateral lower extremity. Gastrointestinal system: Abdomen is nondistended, soft and nontender. No organomegaly or masses felt. Normal bowel sounds heard. Central nervous system: Alert and oriented. No focal neurological deficits. Extremities: Symmetric 5 x 5 power. Skin: No rashes, lesions or ulcers.  Psychiatry: Judgement and insight appear normal. Mood & affect appropriate.    Data Reviewed: I have personally reviewed following labs and imaging studies  CBC: Recent Labs  Lab 05/01/22 0630 05/01/22 1101 05/02/22 0636 05/02/22 2220 05/03/22 0450 05/04/22 0951  WBC 19.3* 21.1* 17.6* 17.5* 17.2* 15.0*  NEUTROABS 17.9* 17.4* 15.8* 16.3* 12.5*  --   HGB 7.0* 8.9* 8.5* 8.8* 8.0* 7.9*  HCT 20.5* 25.8* 24.7* 25.6* 23.5* 23.3*  MCV 82.3 83.0 82.6 83.9 83.9 84.7  PLT 213 214 199 221 194 198    Basic Metabolic Panel: Recent Labs  Lab 04/30/22 0145 05/01/22 0630 05/02/22 0636 05/03/22 0450 05/04/22 0500  NA 137 134* 138 137 141  K 4.3 4.0 3.9 4.1 4.4  CL 96* 97* 100 104 109  CO2 25 25 28 25 22    GLUCOSE 162* 133* 136* 132* 145*  BUN 49* 62* 35* 47* 57*  CREATININE 3.69* 4.02*  4.11* 2.43* 2.81* 3.00*  CALCIUM 7.6* 7.3* 7.2* 7.4* 7.6*  PHOS 5.8* 6.2* 3.8 4.4 4.8*    GFR: Estimated Creatinine Clearance: 24.8 mL/min (A) (by C-G formula based on SCr of 3 mg/dL (H)). Liver Function Tests: Recent Labs  Lab 04/28/22 0400 04/29/22 0455 04/30/22 0145 05/01/22 0630 05/02/22 0636 05/03/22 0450 05/04/22 0500  AST 52*  --   --   --   --   --   --   ALT 34  --   --   --   --   --   --   ALKPHOS 44  --   --   --   --   --   --   BILITOT 1.1  --   --   --   --   --   --   PROT 4.1*  --   --   --   --   --   --   ALBUMIN 1.7*  1.7*   < > 1.8* 1.8* 1.8* 2.1* 2.2*   < > = values in this interval not displayed.    No results for input(s): "LIPASE", "AMYLASE" in the last 168 hours. No results for input(s): "AMMONIA" in the last 168 hours. Coagulation Profile: No results for input(s): "INR", "PROTIME" in the last 168 hours. Cardiac Enzymes: Recent Labs  Lab 04/28/22 0400  CKTOTAL 1,772*    BNP (last 3 results) No results for input(s): "PROBNP" in the last 8760 hours. HbA1C: No results for input(s): "HGBA1C" in the last 72 hours. CBG: Recent Labs  Lab 05/03/22 1240 05/03/22 1630 05/03/22 2136 05/04/22 0649 05/04/22 1158  GLUCAP 91 184* 190* 119* 121*    Lipid Profile: No results for input(s): "CHOL", "HDL", "LDLCALC", "TRIG", "CHOLHDL", "LDLDIRECT" in the last 72 hours. Thyroid Function Tests: No results for input(s): "TSH", "T4TOTAL", "FREET4", "T3FREE", "THYROIDAB" in the last 72 hours. Anemia Panel: No results for input(s): "VITAMINB12", "FOLATE", "FERRITIN", "TIBC", "IRON", "RETICCTPCT" in the last 72 hours.  Sepsis Labs: No results for input(s): "PROCALCITON", "LATICACIDVEN" in the last 168 hours.  No results found for this or any previous visit (from the past 240 hour(s)).    Radiology Studies: No results found.  Scheduled Meds:  amLODipine  10 mg  Oral Daily   Chlorhexidine Gluconate Cloth  6 each Topical Daily   DULoxetine  30 mg Oral Daily   folic acid  2 mg Oral Daily   guaiFENesin-dextromethorphan  10 mL Oral Q12H   insulin aspart  0-9 Units Subcutaneous TID WC   insulin glargine-yfgn  8 Units Subcutaneous QHS   linaclotide  145 mcg Oral QAC breakfast   pantoprazole  40 mg Oral Daily   predniSONE  15 mg Oral Q supper   predniSONE  20 mg Oral Q breakfast   senna  2 tablet Oral BID   vitamin B-12  1,000 mcg Oral Daily   Continuous Infusions:    LOS: 17 days   Hughie Closs, MD [facility]  05/04/2022, 2:11 PM   *Please note that this is a verbal dictation therefore any spelling or grammatical errors are due to the "Dragon [employer] One" system interpretation.  Please page via Amion and do not message via secure chat for urgent patient care matters. Secure chat can be used for non urgent patient care matters.  How to contact the [facility] Attending or Consulting provider 7A - 7P or covering provider during after hours 7P -7A, for this patient?  Check the care team in [facility] and look for a) attending/consulting [facility] provider listed and b) the [facility] team listed. Page or secure chat 7A-7P. Log into [email or web address] and use [place]'s universal password to access. If you do not have the password, please contact the hospital operator. Locate the  [place] provider you are looking for under [facility] and page to a number that you can be directly reached. If you still have difficulty reaching the provider, please page the [facility] (Director on Call) for the Hospitalists listed on amion for assistance.

## 2022-05-05 DIAGNOSIS — G9341 Metabolic encephalopathy: Secondary | ICD-10-CM | POA: Diagnosis not present

## 2022-05-05 LAB — RENAL FUNCTION PANEL
Albumin: 2.3 g/dL — ABNORMAL LOW (ref 3.5–5.0)
Anion gap: 10 (ref 5–15)
BUN: 66 mg/dL — ABNORMAL HIGH (ref 6–20)
CO2: 23 mmol/L (ref 22–32)
Calcium: 7.8 mg/dL — ABNORMAL LOW (ref 8.9–10.3)
Chloride: 109 mmol/L (ref 98–111)
Creatinine, Ser: 3.02 mg/dL — ABNORMAL HIGH (ref 0.44–1.00)
GFR, Estimated: 18 mL/min — ABNORMAL LOW (ref >60)
Glucose, Bld: 150 mg/dL — ABNORMAL HIGH (ref 70–99)
Phosphorus: 5.7 mg/dL — ABNORMAL HIGH (ref 2.5–4.6)
Potassium: 4.4 mmol/L (ref 3.5–5.1)
Sodium: 142 mmol/L (ref 135–145)

## 2022-05-05 LAB — CBC
HCT: 24.1 % — ABNORMAL LOW (ref 36.0–46.0)
Hemoglobin: 8.1 g/dL — ABNORMAL LOW (ref 12.0–15.0)
MCH: 28.7 pg (ref 26.0–34.0)
MCHC: 33.6 g/dL (ref 30.0–36.0)
MCV: 85.5 fL (ref 80.0–100.0)
Platelets: 216 10*3/uL (ref 150–400)
RBC: 2.82 MIL/uL — ABNORMAL LOW (ref 3.87–5.11)
RDW: 17.8 % — ABNORMAL HIGH (ref 11.5–15.5)
WBC: 13.1 10*3/uL — ABNORMAL HIGH (ref 4.0–10.5)
nRBC: 0 % (ref 0.0–0.2)

## 2022-05-05 LAB — GLUCOSE, CAPILLARY
Glucose-Capillary: 140 mg/dL — ABNORMAL HIGH (ref 70–99)
Glucose-Capillary: 124 mg/dL — ABNORMAL HIGH (ref 70–99)
Glucose-Capillary: 120 mg/dL — ABNORMAL HIGH (ref 70–99)
Glucose-Capillary: 118 mg/dL — ABNORMAL HIGH (ref 70–99)
Glucose-Capillary: 172 mg/dL — ABNORMAL HIGH (ref 70–99)

## 2022-05-05 MED ORDER — HEPARIN SODIUM (PORCINE) 1000 UNIT/ML IJ SOLN
2600.0000 [IU] | Freq: Once | INTRAMUSCULAR | Status: AC
Start: 2022-05-05 — End: 2022-05-05
  Administered 2022-05-05: 2600 [IU] via INTRAVENOUS

## 2022-05-05 MED ORDER — HEPARIN SODIUM (PORCINE) 1000 UNIT/ML IJ SOLN: 2600.0000 [IU] | Freq: Once | INTRAMUSCULAR | Status: DC

## 2022-05-05 MED ORDER — HEPARIN SODIUM (PORCINE) 1000 UNIT/ML IJ SOLN
INTRAMUSCULAR | Status: AC
  Filled 2022-05-05: qty 3

## 2022-05-05 NOTE — Progress Notes (Signed)
Physical Therapy Treatment Patient Details Name: [name] MRN: [id number] DOB: 14-Dec-1965 Today's Date: 05/05/2022   History of Present Illness Pt is a 56 y.o. female admitted 6/26 after being found by her husband on the floor at home with AMS. Admitted with dx of acute metabolic encephalopathy. Incidental COVID+ on admission. Initially admitted to [facility] and transferred to [facility] 7/1 for initiation of HD. Multiple recent hospitalizations, 4/3-5/4 and 6/4-6/14. PMH: PE, Wegener's disease, CKD 3, DM, HTN, chronic pain with an intrathecal pump, OSA, bilat mid-foot amputations, peripheral neuropathy    PT Comments    Pt supine in bed on arrival this session.  Pt agreeable to PT session.  She is less talkative but able to follow all commands.  Heavy max assistance needed to mobilize.  Pt continues to benefit from skilled rehab in a post acute setting as she continues to make slow improvements.    Recommendations for follow up therapy are one component of a multi-disciplinary discharge planning process, led by the attending physician.  Recommendations may be updated based on patient status, additional functional criteria and insurance authorization.  Follow Up Recommendations  Skilled nursing-short term rehab (<3 hours/day)     Assistance Recommended at Discharge    Patient can return home with the following Two people to help with walking and/or transfers;Assistance with cooking/housework;Assist for transportation;Two people to help with bathing/dressing/bathroom;Help with stairs or ramp for entrance   Equipment Recommendations  Other (comment) (hoyer lift)    Recommendations for Other Services       Precautions / Restrictions Precautions Precautions: Fall;Other (comment) Precaution Comments: h/o bilateral transmet amputations, foley catheter, flexiseal, central line Restrictions Weight Bearing Restrictions: No     Mobility  Bed Mobility Overal bed mobility: Needs Assistance Bed  Mobility: Supine to Sit Rolling: Supervision         General bed mobility comments: Able to move to edge of bed with out assistance with heavy use of bed rails and mod cues.  Once sitting edge of bed this session required mod assistance to scoot the rest of the way to the edge of the bed.    Transfers Overall transfer level: Needs assistance Equipment used: Rolling walker (2 wheels) Transfers: Sit to/from Stand Sit to Stand: Max assist, From elevated surface           General transfer comment: Required several attempts before being able to achieve partial flexed stand to place stedy plates underneath her bottom.  Require cues for hip and trunk extension but lacks ability at this time.    Ambulation/Gait Ambulation/Gait assistance:  (unable due to weakness and poor standing trials with increased assistance.)                 Stairs             Wheelchair Mobility    Modified Rankin (Stroke Patients Only)       Balance Overall balance assessment: Needs assistance   Sitting balance-Leahy Scale: Good       Standing balance-Leahy Scale: Poor                              Cognition Arousal/Alertness: Awake/alert Behavior During Therapy: Flat affect Overall Cognitive Status: Difficult to assess                                 General Comments: Pt quiet this  session, however following all commands        Exercises      General Comments        Pertinent Vitals/Pain Pain Assessment Pain Assessment: No/denies pain    Home Living                          Prior Function            PT Goals (current goals can now be found in the care plan section) Acute Rehab PT Goals Patient Stated Goal: home Potential to Achieve Goals: Good Progress towards PT goals: Progressing toward goals    Frequency    Min 3X/week      PT Plan Current plan remains appropriate    Co-evaluation              AM-PAC  PT "6 Clicks" Mobility   Outcome Measure  Help needed turning from your back to your side while in a flat bed without using bedrails?: A Little Help needed moving from lying on your back to sitting on the side of a flat bed without using bedrails?: A Little Help needed moving to and from a bed to a chair (including a wheelchair)?: Total Help needed standing up from a chair using your arms (e.g., wheelchair or bedside chair)?: Total Help needed to walk in hospital room?: Total Help needed climbing 3-5 steps with a railing? : Total 6 Click Score: 10    End of Session Equipment Utilized During Treatment: Gait belt Activity Tolerance: Patient limited by pain Patient left: with call bell/phone within reach;in chair (w/ lift pad under patient bottom.) Nurse Communication: Mobility status PT Visit Diagnosis: Muscle weakness (generalized) (M62.81);Other abnormalities of gait and mobility (R26.89)     Time: 6010-9323 PT Time Calculation (min) (ACUTE ONLY): 23 min  Charges:  $Therapeutic Activity: 23-37 mins                     Bonney Leitz , PTA Acute Rehabilitation Services Office [phone]    Gari Hartsell Artis Delay 05/05/2022, 12:05 PM

## 2022-05-05 NOTE — Progress Notes (Signed)
Patient ID: [name], female   DOB: [birth year], 56 y.o.   MRN: [id number] [facility] Progress Note   Assessment/ Plan:   1. Acute kidney Injury: Normal renal function at baseline now with AKI in setting of UTI.  Etiology though to be hemodynamically mediated in setting of UTI and NOT suspected to be related to her history of ANCA associated GPA based on recent rituximab administration in May and urinalysis not suggestive of GN but rather UTI.  Repeated UA 7/11 - looks consistent with foley specimen not GN.  Weighing risk:benefit of renal biopsy that I think would be low yield I recommend against a renal biopsy at this time.   She underwent HD on early AM 7/11 for possible uremic symptoms - less talkative/interactive.  BUN was only in 60s but family feels significantly improved though with so many confounding factors difficult to know and she looked even better the following day.  However BUN back into the 60s (though Cr remains stable in 3s); long discussion with pt/son --> agree HD today, increase po intake because BUN/Cr suggests dry.  Uremia typically BUN > 100 but they feel she's sensitive and benefited before from HD at lower BUN level.   I'm encouraged by the plateau of her renal function today and hope this may be last dialysis.   Foley out and has purewick.  Still need I/Os.  PVRs ok.  UOP improved.    2.  Anemia: Previously with some bleeding around her dialysis catheter and some transient hematochezia.  Status post PRBC transfusions, CT 7/10 with gluteal hematoma for which surgery was consulted and is observing for now.  Hb 8.1, transfuse < 7.  3.  Altered mental status: Suspected to be multifactorial etiology with lumbar puncture deferred after improvement of mental status with dialysis and reduction of baclofen rate.  MS again worse and had HD 7/11 - improvement noted by family and she's further improved today so I don't know that it was purely dialysis that helped.  The  robaxin PM prior - worse yesterday, so that was d/c'd.  Per above concern for uremia and HD today.  4.  Sepsis: Suspected to be of pneumonia/urinary tract infection etiology and status post completion of antibiotics. 5.  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  Dx 15y ago = on chronic methotrexate/rituximab and pred ( sees Dr. Talbert Cage with rheumatology). Per above doubt renal GPA and no plans for renal biopsy. .  6.  COVID-19 virus infection: Incidental positive COVID PCR found at admission.  Remains on corticosteroids and did not get remdesivir due to elevated LFTs. 7.  Adrenal insufficiency  Will continue to follow.  Needs PT/OT eval moving towards dispo.  Will continue to follow.  Subjective:   Son bedside Improved lethargy compared to post robaxin day yesterday but not back to great day Wed  UOP improved Not drinking much but can   Objective:   BP (!) 150/85 (BP Location: Right Arm)   Pulse 91   Temp 98 F (36.7 C) (Oral)   Resp 14   Ht 5\' 5"  (1.651 m)   Wt 102.1 kg   SpO2 98%   BMI 37.46 kg/m   Intake/Output Summary (Last 24 hours) at 05/05/2022 1241 Last data filed at 05/05/2022 1200 Gross per 24 hour  Intake 560 ml  Output 1850 ml  Net -1290 ml    Weight change:   Physical  Exam: Gen: Appears comfortable resting in bed but drowsy, son sitting at bedside  CVS: Pulse regular rhythm, normal rate, S1 and S2 normal Resp: Transmitted breath sounds bilaterally without rales/rhonchi Abd: Soft, obese, nontender, bowel sounds normal Ext: Trace lower extremity edema.  Bilateral forefoot amputations noted Neuro: speaking and answering questions today but limited to 1 word answers GU :purewick with yellow clear urine  Imaging: No results found.  Labs: BMET Recent Labs  Lab 04/29/22 0455 04/30/22 0145 05/01/22 0630 05/02/22 0636 05/03/22 0450 05/04/22 0500 05/05/22 0605   NA 136 137 134* 138 137 141 142  K 3.7 4.3 4.0 3.9 4.1 4.4 4.4  CL 98 96* 97* 100 104 109 109  CO2 27 25 25 28 25 22 23   GLUCOSE 136* 162* 133* 136* 132* 145* 150*  BUN 36* 49* 62* 35* 47* 57* 66*  CREATININE 3.11* 3.69* 4.02*  4.11* 2.43* 2.81* 3.00* 3.02*  CALCIUM 7.4* 7.6* 7.3* 7.2* 7.4* 7.6* 7.8*  PHOS 4.9* 5.8* 6.2* 3.8 4.4 4.8* 5.7*    CBC Recent Labs  Lab 05/01/22 1101 05/02/22 0636 05/02/22 2220 05/03/22 0450 05/04/22 0951 05/05/22 0605  WBC 21.1* 17.6* 17.5* 17.2* 15.0* 13.1*  NEUTROABS 17.4* 15.8* 16.3* 12.5*  --   --   HGB 8.9* 8.5* 8.8* 8.0* 7.9* 8.1*  HCT 25.8* 24.7* 25.6* 23.5* 23.3* 24.1*  MCV 83.0 82.6 83.9 83.9 84.7 85.5  PLT 214 199 221 194 198 216     Medications:     amLODipine  10 mg Oral Daily   Chlorhexidine Gluconate Cloth  6 each Topical Daily   diclofenac Sodium  2 g Topical QID   DULoxetine  30 mg Oral Daily   folic acid  2 mg Oral Daily   guaiFENesin-dextromethorphan  10 mL Oral Q12H   insulin aspart  0-9 Units Subcutaneous TID WC   insulin glargine-yfgn  8 Units Subcutaneous QHS   linaclotide  145 mcg Oral QAC breakfast   pantoprazole  40 mg Oral Daily   predniSONE  15 mg Oral Q supper   predniSONE  20 mg Oral Q breakfast   senna  2 tablet Oral BID   vitamin B-12  1,000 mcg Oral Daily  05/07/22 MD [facility] Pager [phone]

## 2022-05-05 NOTE — Tx Team (Signed)
Received patient in bed, alert and oriented. Informed consent signed and in chart.  Time tx completed: 1645  HD treatment completed. Patient tolerated well. Fistula/Graft/HD catheter without signs and symptoms of complications. Patient transported back to the room, alert and orient and in no acute distress. Report given to bedside RN.  Total UF removed: 0  Medication given: 0  Post HD VS: Temp-98.6, BP-147/86 (103), HR-96 , RR-19,SP02- 100  Post HD weight: 95.2

## 2022-05-05 NOTE — Plan of Care (Signed)

## 2022-05-05 NOTE — Progress Notes (Signed)
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   Patient presents with worsening intermittent altered mental status and increased weakness.  She has been declining since approximately January, 6 months ago per husband.  She was recently admitted to our facility and treated for pneumonia with moderate improvement.  She was to continue on vancomycin through 04/08/2022 followed by infectious disease.  Bronchoscopy during that admission on 03/30/2022 shows Candida in the setting of ongoing antibiotics and urine culture ultimately grew yeast - deemed to be colonized and not active infection given fungitel was reassuringly low per ID discussion. Of note she is also had a recent diagnosis of pulmonary embolism on apixaban.   Patient admitted this hospitalization (on 04/17/22) with elevated white count, lactic acid, and acute renal failure in the setting of poor p.o. intake altered mental status tachycardia and tachypnea concerning for sepsis. Patient's infectious source was presumed to be pneumonia versus UTI - placed initially on broad-spectrum antibiotics of vancomycin and cefepime. Her sepsis criteria did not improve and her renal function continued to worsen in the setting of presumed sepsis and rhabdomyolysis (ATN). Nephrology following with worsening mental status, tremors/muscle spasms with metabolic derangements. Patient transferred to main campus for neurology evaluation, EEG ultimately unremarkable for seizures-her movement was considered to be musculoskeletal, somewhat choreiform in nature, holding off on LP per neuro given improvements with HD and intrathecal pump turned down (unable to be turned  off but can be turned to [facility]).  Assessment & Plan:   Principal Problem:   Acute metabolic encephalopathy Active Problems:   Acute cystitis without hematuria   Current chronic use of systemic steroids   Chronic pain   Secondary adrenal insufficiency (HCC)   AKI (acute kidney injury) (HCC)   Granulomatosis with polyangiitis (HCC)   GERD (gastroesophageal reflux disease)   Essential hypertension   Obesity (BMI 30-39.9)   COVID-19 virus infection   Severe sepsis (HCC)   Pulmonary embolism (HCC)   Transaminitis   Rhabdomyolysis  Acute metabolic encephalopathy, POA, multifactorial - Initially presumed to be secondary to sepsis/renal failure/hyponatremia  - Potential covid encephalitis, however spastic choreiform movements are not consistent with COVID encephalitis - MRI unremarkable for acute process  - EEG unremarkable for seizure - Discontinued all home CNS depressant medications - continued Cymbalta only - Mental status improving after initiating dialysis and discontinuation of intrathecal pain pump - Discussed with neurology.  Holding off on LP and CTA head at this time with improvement.  Neurology signed off 7/6   Acute blood loss anemia on anemia of chronic kidney disease, history of chronic iron deficiency anemia: Noted some bleeding from her dialysis catheter as well as minimal rectal bleeding with history of hemorrhoids and fissure - Transfused 2 unit packed red blood cell 7/6, 1 unit 7/7 and 1 unit on 04/29/2022 as well as 04/30/2022, and then 1 more unit on 1723.  CT abdomen and pelvis on 04/30/2022 shows right gluteus minimus and piriformis hematoma.  General surgery was consulted however per them, no indication for any surgery at the moment and they recommended continuing to transfuse as needed and signed off.  Finally her hemoglobin is fairly stable .  Monitor daily and transfuse if hemoglobin drops less than 7.  Please note following: - Followed  by [facility] with history  of chronic normocytic anemia as well as iron deficiency anemia with hemoglobin ranging from 8.9-13.6.  Has been getting outpatient iron infusion biweekly. - Followed by [facility].  Had normal colonoscopy 2014, flex sigmoidoscopy 2020 for rectal bleeding which revealed deep anal fissure and external hemorrhoids.  Underwent Botox, then lateral internal anal sphincterotomy.  EGD November 2022 was normal.  Colonoscopy revealed multiple diverticula, internal hemorrhoids, 3 polyps. Suspect this is likely due to either hemorrhoids/fissure/diverticula in setting of chronic iron deficiency and anemia of chronic disease.    Polypharmacy risk - Home medications include bupivacaine/zinconotide intrathecal baclofen pump, duloxetine, gabapentin, methocarbamol, nucynta, oxycodone, Topamax.   Chronic pain, neuropathy, and chronic opioid dependence  - Patient has intrathecal pump with bupivacaine and ziconotide - turned down to lowest setting 7/3 (cannot stop completely per tech)   AKI/ATN currently requiring HD - Complicated and multifactorial etiology, possibly ATN per nephrology - Temp HD catheter initially placed 04/24/2022 - issues with bleeding, but no more.  Last hemodialysis 05/01/2022.  Patient's interaction and alertness overall improved after dialysis.  Patient's creatinine has only slightly rising but has remained stable since yesterday but BUN continues to rise.  The son and has been convinced that patient needs dialysis.  They had conversation with nephrology and looks like nephrology is going to do dialysis today.  All management per nephrology regarding this.   Granulomatosis with polyangiitis  - Patient receiving rituximab and methotrexate as an outpatient (most recently at [facility] in May 2023) - confirmed with rheumatology at [facility] (Dr. Dimple Casey) - Nephrology sent off multiple biomarkers: *Of note C4, and Kappa light chains* were elevated minimally but otherwise ANCA, ANA, RF, anti-dsDNA, c3, spep/sflc  without abnormalities, hepatitis panel, HIV, EBV, CMV were unremarkable - Cryos still pending  - Continue prednisone 20mg  qAM and 15mg  qPM - continue to wean to baseline level (5mg  daily)    Secondary adrenal insufficiency  -Continue steroids    Current chronic use of systemic steroids -On 5 mg daily at baseline.  Continue to wean   Acute hypoxic respiratory failure, POA -Multifactorial in the setting of metabolic encephalopathy, presumed pneumonia, granulomatosis/polyangiitis, concurrent COVID-19 infection -Potentially exacerbated by hypervolemia surrounding renal failure -Resolved and remains on room air   Presumed severe sepsis secondary to pneumonia versus UTI -Completed 5 days of antibiotic therapy   C. difficile colonization -C. difficile toxin is negative.  No indication of treatment at this time   GERD  -PPI    Rhabdomyolysis -Secondary to prolonged downtime -Improved   Elevated transaminitis -Most likely in the setting of sepsis/shock liver and rhabdomyolysis. -Improved   History of pulmonary embolism  -Lovenox on hold due to recurrent anemia requiring blood transfusion.   COVID-19 virus infection, questionably incidental - COVID PCR incidentally positive at time of admission, without respiratory symptoms - Remdesivir withheld due to elevated liver enzymes - Completed 10-day quarantine   Diabetes mellitus -A1c 8.6 -Blood sugar controlled, continue current dose of Semglee, sliding scale insulin   Obesity  Estimated body mass index is 38.37 kg/m as calculated from the following:   Height as of this encounter: 5\' 5"  (1.651 m).   Weight as of this encounter: 104.6 kg.  DVT prophylaxis: Place and maintain sequential compression device Start: 04/28/22 1251 avoiding heparin products due to continuous bleed requiring transfusion   Code Status: Full Code  Family Communication: Her son present at bedside.  Long discussion with husband over the phone yesterday,  details in note from  yesterday.  Status is: Inpatient Remains inpatient appropriate because: Mentation waxing and waning and requiring IHD.   Estimated body mass index is 37.46 kg/m as calculated from the following:   Height as of this encounter: 5\' 5"  (1.651 m).   Weight as of this encounter: 102.1 kg.    Nutritional Assessment: Body mass index is 37.46 kg/m. Seen by dietician.  I agree with the assessment and plan as outlined below: Nutrition Status . Skin Assessment: I have examined the patient's skin and I agree with the wound assessment as performed by the wound care RN as outlined below:    Consultants:  Nephrology Neurology-signed off General surgery-signed off  Procedures:  As above  Antimicrobials:  Anti-infectives (From admission, onward)    Start     Dose/Rate Route Frequency Ordered Stop   04/21/22 2200  meropenem (MERREM) 500 mg in sodium chloride 0.9 % 100 mL IVPB  Status:  Discontinued        500 mg 200 mL/hr over 30 Minutes Intravenous Every 12 hours 04/21/22 1507 04/22/22 1945   04/21/22 1000  fluconazole (DIFLUCAN) tablet 100 mg  Status:  Discontinued        100 mg Oral Daily 04/20/22 0952 04/22/22 1945   04/20/22 1500  meropenem (MERREM) 1 g in sodium chloride 0.9 % 100 mL IVPB  Status:  Discontinued        1 g 200 mL/hr over 30 Minutes Intravenous Every 12 hours 04/20/22 1408 04/21/22 1507   04/20/22 1045  fluconazole (DIFLUCAN) tablet 200 mg        200 mg Oral  Once 04/20/22 0952 04/20/22 1207   04/20/22 1021  ceFEPIme (MAXIPIME) 2 g in sodium chloride 0.9 % 100 mL IVPB  Status:  Discontinued        2 g 200 mL/hr over 30 Minutes Intravenous Every 24 hours 04/19/22 1355 04/20/22 1334   04/19/22 1100  linezolid (ZYVOX) IVPB 600 mg  Status:  Discontinued        600 mg 300 mL/hr over 60 Minutes Intravenous Every 12 hours 04/19/22 1014 04/22/22 1945   04/18/22 1200  vancomycin (VANCOREADY) IVPB 750 mg/150 mL  Status:  Discontinued        750 mg 150  mL/hr over 60 Minutes Intravenous Every 24 hours 04/17/22 1306 04/19/22 1014   04/17/22 2200  ceFEPIme (MAXIPIME) 2 g in sodium chloride 0.9 % 100 mL IVPB  Status:  Discontinued        2 g 200 mL/hr over 30 Minutes Intravenous Every 12 hours 04/17/22 1306 04/19/22 1355   04/17/22 1300  vancomycin (VANCOREADY) IVPB 2000 mg/400 mL        2,000 mg 200 mL/hr over 120 Minutes Intravenous  Once 04/17/22 1208 04/17/22 1729   04/17/22 1200  ceFEPIme (MAXIPIME) 2 g in sodium chloride 0.9 % 100 mL IVPB        2 g 200 mL/hr over 30 Minutes Intravenous  Once 04/17/22 1153 04/17/22 1330   04/17/22 1200  metroNIDAZOLE (FLAGYL) IVPB 500 mg        500 mg 100 mL/hr over 60 Minutes Intravenous  Once 04/17/22 1153 04/17/22 1435   04/17/22 1200  vancomycin (VANCOCIN) IVPB 1000 mg/200 mL premix  Status:  Discontinued        1,000 mg 200 mL/hr over 60 Minutes Intravenous  Once 04/17/22 1153 04/17/22 1208         Subjective:  Patient seen and examined.  Son at the bedside.  Patient is  slightly more talkative compared to yesterday.  Her less interactive state yesterday was likely due to receiving Roblox on the night before.  No more muscle relaxants on this patient.  BUN rising but creatinine is stable.  Nephrology doing dialysis today.  Objective: Vitals:   05/04/22 2108 05/04/22 2356 05/05/22 0424 05/05/22 0804  BP: 122/75 (!) 145/86 130/77 (!) 150/85  Pulse: 80 89 80 91  Resp:    14  Temp: 98.1 F (36.7 C) 98.6 F (37 C) 98.3 F (36.8 C) 98 F (36.7 C)  TempSrc: Oral Axillary Axillary Oral  SpO2: 93% 96% 99% 98%  Weight:      Height:        Intake/Output Summary (Last 24 hours) at 05/05/2022 1308 Last data filed at 05/05/2022 1200 Gross per 24 hour  Intake 560 ml  Output 1850 ml  Net -1290 ml    Filed Weights   04/26/22 1914 04/28/22 1645 05/02/22 0421  Weight: 105.2 kg 100.7 kg 102.1 kg    Examination:  General exam: Appears calm and comfortable  Respiratory system: Clear to  auscultation. Respiratory effort normal. Cardiovascular system: S1 & S2 heard, RRR. No JVD, murmurs, rubs, gallops or clicks.  +3 pitting edema bilateral lower extremity. Gastrointestinal system: Abdomen is nondistended, soft and nontender. No organomegaly or masses felt. Normal bowel sounds heard. Central nervous system: Alert and oriented. No focal neurological deficits. Extremities: Symmetric 5 x 5 power.  Bilateral forefoot amputation. Skin: No rashes, lesions or ulcers.  Psychiatry: Judgement and insight appear normal. Mood & affect appropriate.    Data Reviewed: I have personally reviewed following labs and imaging studies  CBC: Recent Labs  Lab 05/01/22 0630 05/01/22 1101 05/02/22 0636 05/02/22 2220 05/03/22 0450 05/04/22 0951 05/05/22 0605  WBC 19.3* 21.1* 17.6* 17.5* 17.2* 15.0* 13.1*  NEUTROABS 17.9* 17.4* 15.8* 16.3* 12.5*  --   --   HGB 7.0* 8.9* 8.5* 8.8* 8.0* 7.9* 8.1*  HCT 20.5* 25.8* 24.7* 25.6* 23.5* 23.3* 24.1*  MCV 82.3 83.0 82.6 83.9 83.9 84.7 85.5  PLT 213 214 199 221 194 198 216    Basic Metabolic Panel: Recent Labs  Lab 05/01/22 0630 05/02/22 0636 05/03/22 0450 05/04/22 0500 05/05/22 0605  NA 134* 138 137 141 142  K 4.0 3.9 4.1 4.4 4.4  CL 97* 100 104 109 109  CO2 25 28 25 22 23   GLUCOSE 133* 136* 132* 145* 150*  BUN 62* 35* 47* 57* 66*  CREATININE 4.02*  4.11* 2.43* 2.81* 3.00* 3.02*  CALCIUM 7.3* 7.2* 7.4* 7.6* 7.8*  PHOS 6.2* 3.8 4.4 4.8* 5.7*    GFR: Estimated Creatinine Clearance: 24.6 mL/min (A) (by C-G formula based on SCr of 3.02 mg/dL (H)). Liver Function Tests: Recent Labs  Lab 05/01/22 0630 05/02/22 0636 05/03/22 0450 05/04/22 0500 05/05/22 0605  ALBUMIN 1.8* 1.8* 2.1* 2.2* 2.3*    No results for input(s): "LIPASE", "AMYLASE" in the last 168 hours. No results for input(s): "AMMONIA" in the last 168 hours. Coagulation Profile: No results for input(s): "INR", "PROTIME" in the last 168 hours. Cardiac Enzymes: No results  for input(s): "CKTOTAL", "CKMB", "CKMBINDEX", "TROPONINI" in the last 168 hours.  BNP (last 3 results) No results for input(s): "PROBNP" in the last 8760 hours. HbA1C: No results for input(s): "HGBA1C" in the last 72 hours. CBG: Recent Labs  Lab 05/04/22 1529 05/04/22 2104 05/05/22 0614 05/05/22 0802 05/05/22 1221  GLUCAP 172* 216* 140* 124* 120*    Lipid Profile: No results for input(s): "CHOL", "HDL", "  LDLCALC", "TRIG", "CHOLHDL", "LDLDIRECT" in the last 72 hours. Thyroid Function Tests: No results for input(s): "TSH", "T4TOTAL", "FREET4", "T3FREE", "THYROIDAB" in the last 72 hours. Anemia Panel: No results for input(s): "VITAMINB12", "FOLATE", "FERRITIN", "TIBC", "IRON", "RETICCTPCT" in the last 72 hours.  Sepsis Labs: No results for input(s): "PROCALCITON", "LATICACIDVEN" in the last 168 hours.  No results found for this or any previous visit (from the past 240 hour(s)).    Radiology Studies: No results found.  Scheduled Meds:  amLODipine  10 mg Oral Daily   Chlorhexidine Gluconate Cloth  6 each Topical Daily   diclofenac Sodium  2 g Topical QID   DULoxetine  30 mg Oral Daily   folic acid  2 mg Oral Daily   guaiFENesin-dextromethorphan  10 mL Oral Q12H   insulin aspart  0-9 Units Subcutaneous TID WC   insulin glargine-yfgn  8 Units Subcutaneous QHS   linaclotide  145 mcg Oral QAC breakfast   pantoprazole  40 mg Oral Daily   predniSONE  15 mg Oral Q supper   predniSONE  20 mg Oral Q breakfast   senna  2 tablet Oral BID   vitamin B-12  1,000 mcg Oral Daily   Continuous Infusions:    LOS: 18 days   Hughie Closs, MD [facility]  05/05/2022, 1:08 PM   *Please note that this is a verbal dictation therefore any spelling or grammatical errors are due to the "[employer]agon [employer] One" system interpretation.  Please page via Amion and do not message via secure chat for urgent patient care matters. Secure chat can be used for non urgent patient care  matters.  How to contact the [facility] Attending or Consulting provider 7A - 7P or covering provider during after hours 7P -7A, for this patient?  Check the care team in [facility] and look for a) attending/consulting [facility] provider listed and b) the [facility] team listed. Page or secure chat 7A-7P. Log into [email or web address] and use [place]'s universal password to access. If you do not have the password, please contact the hospital operator. Locate the [facility] provider you are looking for under [facility] and page to a number that you can be directly reached. If you still have difficulty reaching the provider, please page the [facility] (Director on Call) for the Hospitalists listed on amion for assistance.

## 2022-05-06 DIAGNOSIS — G9341 Metabolic encephalopathy: Secondary | ICD-10-CM | POA: Diagnosis not present

## 2022-05-06 LAB — RENAL FUNCTION PANEL
Albumin: 2.4 g/dL — ABNORMAL LOW (ref 3.5–5.0)
Anion gap: 6 (ref 5–15)
BUN: 37 mg/dL — ABNORMAL HIGH (ref 6–20)
CO2: 26 mmol/L (ref 22–32)
Calcium: 7.8 mg/dL — ABNORMAL LOW (ref 8.9–10.3)
Chloride: 107 mmol/L (ref 98–111)
Creatinine, Ser: 1.72 mg/dL — ABNORMAL HIGH (ref 0.44–1.00)
GFR, Estimated: 34 mL/min — ABNORMAL LOW (ref >60)
Glucose, Bld: 140 mg/dL — ABNORMAL HIGH (ref 70–99)
Phosphorus: 4.2 mg/dL (ref 2.5–4.6)
Potassium: 4.2 mmol/L (ref 3.5–5.1)
Sodium: 139 mmol/L (ref 135–145)

## 2022-05-06 LAB — GLUCOSE, CAPILLARY
Glucose-Capillary: 111 mg/dL — ABNORMAL HIGH (ref 70–99)
Glucose-Capillary: 130 mg/dL — ABNORMAL HIGH (ref 70–99)
Glucose-Capillary: 142 mg/dL — ABNORMAL HIGH (ref 70–99)
Glucose-Capillary: 202 mg/dL — ABNORMAL HIGH (ref 70–99)

## 2022-05-06 MED ORDER — ORAL CARE MOUTH RINSE
15.0000 mL | OROMUCOSAL | Status: DC | PRN
Start: 2022-05-06 — End: 2022-05-18

## 2022-05-06 MED ORDER — METOPROLOL TARTRATE 5 MG/5ML IV SOLN: 5.0000 mg | INTRAVENOUS | Status: DC | PRN

## 2022-05-06 NOTE — Progress Notes (Signed)
Patient ID: [name], female   DOB: 05-21-1966, 56 y.o.   MRN: [id number] [facility] Progress Note   Assessment/ Plan:   1. Acute kidney Injury: Normal renal function at baseline now with AKI in setting of UTI.  Etiology though to be hemodynamically mediated in setting of UTI and NOT suspected to be related to her history of ANCA associated GPA based on recent rituximab administration in May and urinalysis not suggestive of GN but rather UTI.  Repeated UA 7/11 - looks consistent with foley specimen not GN.  Weighing risk:benefit of renal biopsy that I think would be low yield I recommend against a renal biopsy at this time.   She underwent HD on early AM 7/11 for possible uremic symptoms - less talkative/interactive.  BUN was only in 60s but family feels significantly improved though with so many confounding factors difficult to know and she looked even better the following day. Uremia typically BUN > 100 but they feel she's sensitive and benefited before from HD at lower BUN level.   I'm encouraged by the plateau of her renal function today and hope that may have  been last dialysis.   Foley out and has purewick.  Still need I/Os.  PVRs ok.  UOP improved.    2.  Anemia: Previously with some bleeding around her dialysis catheter and some transient hematochezia.  Status post PRBC transfusions, CT 7/10 with gluteal hematoma for which surgery was consulted and is observing for now.  Hb 8s, transfuse < 7.  3.  Altered mental status: Suspected to be multifactorial etiology with lumbar puncture deferred after improvement of mental status with dialysis and reduction of baclofen rate.  MS again worse and had HD 7/11, felt improved; same situation 7/14 - did dialysis and they feel improved.   Robaxin definitely made things worse and this has been d/c'd  4.  Sepsis: Suspected to be of pneumonia/urinary tract infection etiology and status post completion of antibiotics. 5.  Granulomatosis with  polyangiitis: On prednisone dose decreased now to 35 mg daily at this time with previous rituximab administered in May.  Dx 15y ago = on chronic methotrexate/rituximab and pred ( sees Dr. Talbert Cage with rheumatology). Per above doubt renal GPA and no plans for renal biopsy at this time.  6.  COVID-19 virus infection: Incidental positive COVID PCR found at admission.  Remains on corticosteroids and did not get remdesivir due to elevated LFTs. 7.  Adrenal insufficiency 8  H/O PE: 03/2022 on CTA; had been on apixiban but held due to anemia requiring repeated transfusion - ? If needs rechallenge with AC vs IVC filter? Messaged primary.  Will continue to follow.  Needs PT/OT eval moving towards dispo.  Will continue to follow.  Subjective:   Husband bedside Husband/pt feel mental status and lethargy improved after HD yesterday UOP yesterday recorded Has increased po intake   Objective:   BP (!) 154/90 (BP Location: Right Arm)   Pulse 87   Temp 98.3 F (36.8 C) (Oral)   Resp 17   Ht 5\' 5"  (1.651 m)   Wt 95.2 kg   SpO2 97%   BMI 34.93 kg/m   Intake/Output Summary (Last 24 hours) at 05/06/2022 1246 Last data filed at 05/05/2022 1645 Gross per 24 hour  Intake --  Output 0 ml  Net 0 ml    Weight change:   Physical Exam: Gen: Appears comfortable resting in bed but drowsy, husband sitting at bedside  CVS: Pulse regular rhythm,  normal rate, S1 and S2 normal Resp: Transmitted breath sounds bilaterally without rales/rhonchi Abd: Soft, obese, nontender, bowel sounds normal Ext: Trace lower extremity edema.  Bilateral forefoot amputations noted Neuro: speaking and answering questions today but limited to 1 word answers - still seems quiet GU :purewick with yellow clear urine  Imaging: No results found.  Labs: BMET Recent Labs  Lab 04/30/22 0145 05/01/22 0630 05/02/22 0636 05/03/22 0450 05/04/22 0500 05/05/22 0605 05/06/22 0500  NA 137 134* 138 137 141 142 139  K 4.3 4.0  3.9 4.1 4.4 4.4 4.2  CL 96* 97* 100 104 109 109 107  CO2 25 25 28 25 22 23 26   GLUCOSE 162* 133* 136* 132* 145* 150* 140*  BUN 49* 62* 35* 47* 57* 66* 37*  CREATININE 3.69* 4.02*  4.11* 2.43* 2.81* 3.00* 3.02* 1.72*  CALCIUM 7.6* 7.3* 7.2* 7.4* 7.6* 7.8* 7.8*  PHOS 5.8* 6.2* 3.8 4.4 4.8* 5.7* 4.2    CBC Recent Labs  Lab 05/01/22 1101 05/02/22 0636 05/02/22 2220 05/03/22 0450 05/04/22 0951 05/05/22 0605  WBC 21.1* 17.6* 17.5* 17.2* 15.0* 13.1*  NEUTROABS 17.4* 15.8* 16.3* 12.5*  --   --   HGB 8.9* 8.5* 8.8* 8.0* 7.9* 8.1*  HCT 25.8* 24.7* 25.6* 23.5* 23.3* 24.1*  MCV 83.0 82.6 83.9 83.9 84.7 85.5  PLT 214 199 221 194 198 216     Medications:     amLODipine  10 mg Oral Daily   Chlorhexidine Gluconate Cloth  6 each Topical Daily   diclofenac Sodium  2 g Topical QID   DULoxetine  30 mg Oral Daily   folic acid  2 mg Oral Daily   guaiFENesin-dextromethorphan  10 mL Oral Q12H   insulin aspart  0-9 Units Subcutaneous TID WC   insulin glargine-yfgn  8 Units Subcutaneous QHS   linaclotide  145 mcg Oral QAC breakfast   pantoprazole  40 mg Oral Daily   predniSONE  15 mg Oral Q supper   predniSONE  20 mg Oral Q breakfast   senna  2 tablet Oral BID   vitamin B-12  1,000 mcg Oral Daily  05/07/22 MD [facility] Pager [phone]

## 2022-05-06 NOTE — Progress Notes (Signed)
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  She was recently admitted prior to this hospitalization for concerns of pneumonia, underwent bronchoscopy which showed Candida and was seen by infectious disease.  Admitted again on 6/26 with acute renal failure and concerns of UTI/pneumonia therefore started on broad-spectrum antibiotics.  Renal function continued to worsen and nephrology team was consulted.  She also had change in mental status and neurology was consulted who performed EEG which was unremarkable and LP was put on hold.  Currently patient is getting hemodialysis.   Assessment & Plan:  Principal Problem:   Acute metabolic encephalopathy Active Problems:   Acute cystitis without hematuria   Current chronic use of systemic steroids   Chronic pain   Secondary adrenal insufficiency (HCC)   AKI (acute kidney injury) (HCC)   Granulomatosis with polyangiitis (HCC)   GERD (gastroesophageal reflux disease)   Essential hypertension   Obesity (BMI 30-39.9)   COVID-19 virus infection   Severe sepsis (HCC)   Pulmonary embolism (HCC)   Transaminitis   Rhabdomyolysis   Acute metabolic encephalopathy, POA, multifactorial -Multifactorial from sepsis renal failure and hyponatremia.  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  CNS depressant were discontinued except Cymbalta.  Intrathecal pain pump discontinued.  Per neurology holding off on LP.  CTA head due to slow improvement.  Neurology signed off 7/6.   AKI/ATN currently requiring HD -Multiple etiology.  Temporary HD catheter placed 7/3.  Patient is now more interactive and alert.   HD sessions per nephrology at this time. - Nephrology sent off multiple biomarkers: *Of note C4, and Kappa light chains* were elevated minimally but otherwise ANCA, ANA, RF, anti-dsDNA, c3, spep/sflc without abnormalities, hepatitis panel, HIV, EBV, CMV were unremarkable   Acute blood loss anemia on anemia of chronic kidney disease Gluteal hematoma -Does have history of chronic iron deficiency anemia as well.  Some bleeding around her HD catheter site and minimal rectal bleeding from hemorrhoid/fissure.  She required PRBC transfusion during the hospitalization.  Was also found to have gluteus hematoma for which neurosurgery was consulted who recommended transfusion as needed otherwise no surgical indication.  EGD in 2022-normal Colonoscopy-multiple diverticula, hemorrhoids with suspicion for fissure. Follows with [facility]     Polypharmacy risk -Current medications Cymbalta - Home medications included: bupivacaine/zinconotide intrathecal baclofen pump, duloxetine, gabapentin, methocarbamol, nucynta, oxycodone, Topamax.   Chronic pain, neuropathy, and chronic opioid dependence  - Patient has intrathecal pump with bupivacaine and ziconotide - turned down to lowest setting 7/3 (cannot stop completely per tech)     Granulomatosis with polyangiitis  -Follows outpatient now on [facility].  Receiving rituximab and methotrexate. - Current medications prednisone 20 mg in the morning and 50 mg in the evening.  Slowly wean off to 5 mg daily  Secondary adrenal insufficiency  -Continue steroids    Current chronic use of systemic steroids -At home prednisone 5 mg daily.  Currently on 20/15 mg   Acute hypoxic respiratory failure, POA -Resolved now on room air   Presumed severe sepsis secondary to pneumonia versus UTI -Completed 5 days of antibiotic therapy   C. difficile colonization -C. difficile toxin is negative.  No indication of treatment at this  time   GERD  -PPI     Rhabdomyolysis, resolved -Secondary to prolonged downtime    Elevated transaminitis -Most likely in the setting of sepsis/shock liver and rhabdomyolysis. -Improved   History of pulmonary embolism  -Lovenox on hold due to recurrent anemia requiring blood transfusion.   COVID-19 virus infection, questionably incidental -Incidental positive finding at the time of admission.  Remdesivir held due to elevated LFTs.  Completed isolation..   Diabetes mellitus type II, insulin-dependent -A1c 8.6.  Semglee 8 units at bedtime.  Sliding scale and Accu-Cheks.   Obesity  Estimated body mass index is 38.37 kg/m as calculated from the following:   Height as of this encounter: 5\' 5"  (1.651 m).   Weight as of this encounter: 104.6 kg.   PT/OT = SNF TOC aware   DVT prophylaxis: Place and maintain sequential compression device Start: 04/28/22 1251 Code Status: Full code Family Communication:    Status is: Inpatient Remains inpatient appropriate because: Maintain hosp stay until cleared by Nephro. Eventually will go to SNF, TOC aware.       Subjective: Feels ok, states she wants to go home but understands she is not ready for dc yet.  Discussed risk and benefit of being off AC due to anemia while she has recent dc of small PE. She understands the risk and benefit.    Examination:  Constitutional: Not in acute distress Respiratory: Clear to auscultation bilaterally Cardiovascular: Normal sinus rhythm, no rubs. 2+ b/l LE pitting edema.  Abdomen: Nontender nondistended good bowel sounds Musculoskeletal: No edema noted. B/l forefoot amputations.  Skin: No rashes seen Neurologic: CN 2-12 grossly intact.  And nonfocal Psychiatric: Normal judgment and insight. Alert and oriented x 3. Normal mood.     Objective: Vitals:   05/05/22 1823 05/05/22 1950 05/05/22 2355 05/06/22 0235  BP: (!) 142/84 (!) 154/90 (!) 145/85 (!) 151/85  Pulse: 90   80  Resp: 20 19 14  (!) 21  Temp: 98.4 F  (36.9 C)  98 F (36.7 C) 98.2 F (36.8 C)  TempSrc: Oral   Oral  SpO2: 99%  98% 97%  Weight:      Height:        Intake/Output Summary (Last 24 hours) at 05/06/2022 0748 Last data filed at 05/05/2022 1645 Gross per 24 hour  Intake 240 ml  Output 550 ml  Net -310 ml   Filed Weights   05/02/22 0421 05/05/22 1233 05/05/22 1654  Weight: 102.1 kg 95.2 kg 95.2 kg     Data Reviewed:   CBC: Recent Labs  Lab 05/01/22 0630 05/01/22 1101 05/02/22 0636 05/02/22 2220 05/03/22 0450 05/04/22 0951 05/05/22 0605  WBC 19.3* 21.1* 17.6* 17.5* 17.2* 15.0* 13.1*  NEUTROABS 17.9* 17.4* 15.8* 16.3* 12.5*  --   --   HGB 7.0* 8.9* 8.5* 8.8* 8.0* 7.9* 8.1*  HCT 20.5* 25.8* 24.7* 25.6* 23.5* 23.3* 24.1*  MCV 82.3 83.0 82.6 83.9 83.9 84.7 85.5  PLT 213 214 199 221 194 198 216   Basic Metabolic Panel: Recent Labs  Lab 05/02/22 0636 05/03/22 0450 05/04/22 0500 05/05/22 0605 05/06/22 0500  NA 138 137 141 142 139  K 3.9 4.1 4.4 4.4 4.2  CL 100 104 109 109 107  CO2 28 25 22 23 26   GLUCOSE 136* 132* 145* 150* 140*  BUN 35* 47* 57* 66* 37*  CREATININE 2.43* 2.81* 3.00* 3.02* 1.72*  CALCIUM 7.2* 7.4* 7.6* 7.8* 7.8*  PHOS 3.8 4.4 4.8* 5.7* 4.2   GFR: Estimated Creatinine Clearance:  41.7 mL/min (A) (by C-G formula based on SCr of 1.72 mg/dL (H)). Liver Function Tests: Recent Labs  Lab 05/02/22 0636 05/03/22 0450 05/04/22 0500 05/05/22 0605 05/06/22 0500  ALBUMIN 1.8* 2.1* 2.2* 2.3* 2.4*   No results for input(s): "LIPASE", "AMYLASE" in the last 168 hours. No results for input(s): "AMMONIA" in the last 168 hours. Coagulation Profile: No results for input(s): "INR", "PROTIME" in the last 168 hours. Cardiac Enzymes: No results for input(s): "CKTOTAL", "CKMB", "CKMBINDEX", "TROPONINI" in the last 168 hours. BNP (last 3 results) No results for input(s): "PROBNP" in the last 8760 hours. HbA1C: No results for input(s): "HGBA1C" in the last 72 hours. CBG: Recent Labs  Lab  05/05/22 0802 05/05/22 1221 05/05/22 1816 05/05/22 2140 05/06/22 0630  GLUCAP 124* 120* 118* 172* 111*   Lipid Profile: No results for input(s): "CHOL", "HDL", "LDLCALC", "TRIG", "CHOLHDL", "LDLDIRECT" in the last 72 hours. Thyroid Function Tests: No results for input(s): "TSH", "T4TOTAL", "FREET4", "T3FREE", "THYROIDAB" in the last 72 hours. Anemia Panel: No results for input(s): "VITAMINB12", "FOLATE", "FERRITIN", "TIBC", "IRON", "RETICCTPCT" in the last 72 hours. Sepsis Labs: No results for input(s): "PROCALCITON", "LATICACIDVEN" in the last 168 hours.  No results found for this or any previous visit (from the past 240 hour(s)).       Radiology Studies: No results found.      Scheduled Meds:  amLODipine  10 mg Oral Daily   Chlorhexidine Gluconate Cloth  6 each Topical Daily   diclofenac Sodium  2 g Topical QID   DULoxetine  30 mg Oral Daily   folic acid  2 mg Oral Daily   guaiFENesin-dextromethorphan  10 mL Oral Q12H   insulin aspart  0-9 Units Subcutaneous TID WC   insulin glargine-yfgn  8 Units Subcutaneous QHS   linaclotide  145 mcg Oral QAC breakfast   pantoprazole  40 mg Oral Daily   predniSONE  15 mg Oral Q supper   predniSONE  20 mg Oral Q breakfast   senna  2 tablet Oral BID   vitamin B-12  1,000 mcg Oral Daily   Continuous Infusions:   LOS: 19 days   Time spent= 35 mins    Keyonte Cookston Joline Maxcy, MD [facility]  If 7PM-7AM, please contact night-coverage  05/06/2022, 7:48 AM

## 2022-05-07 DIAGNOSIS — G9341 Metabolic encephalopathy: Secondary | ICD-10-CM | POA: Diagnosis not present

## 2022-05-07 LAB — CBC
HCT: 25 % — ABNORMAL LOW (ref 36.0–46.0)
Hemoglobin: 8.3 g/dL — ABNORMAL LOW (ref 12.0–15.0)
MCH: 28.5 pg (ref 26.0–34.0)
MCHC: 33.2 g/dL (ref 30.0–36.0)
MCV: 85.9 fL (ref 80.0–100.0)
Platelets: 217 10*3/uL (ref 150–400)
RBC: 2.91 MIL/uL — ABNORMAL LOW (ref 3.87–5.11)
RDW: 17.4 % — ABNORMAL HIGH (ref 11.5–15.5)
WBC: 12.9 10*3/uL — ABNORMAL HIGH (ref 4.0–10.5)
nRBC: 0 % (ref 0.0–0.2)

## 2022-05-07 LAB — COMPREHENSIVE METABOLIC PANEL
ALT: 22 U/L (ref 0–44)
AST: 19 U/L (ref 15–41)
Albumin: 2.6 g/dL — ABNORMAL LOW (ref 3.5–5.0)
Alkaline Phosphatase: 81 U/L (ref 38–126)
Anion gap: 14 (ref 5–15)
BUN: 57 mg/dL — ABNORMAL HIGH (ref 6–20)
CO2: 22 mmol/L (ref 22–32)
Calcium: 8.2 mg/dL — ABNORMAL LOW (ref 8.9–10.3)
Chloride: 105 mmol/L (ref 98–111)
Creatinine, Ser: 2.69 mg/dL — ABNORMAL HIGH (ref 0.44–1.00)
GFR, Estimated: 20 mL/min — ABNORMAL LOW (ref >60)
Glucose, Bld: 311 mg/dL — ABNORMAL HIGH (ref 70–99)
Potassium: 4.5 mmol/L (ref 3.5–5.1)
Sodium: 141 mmol/L (ref 135–145)
Total Bilirubin: 1 mg/dL (ref 0.3–1.2)
Total Protein: 4.8 g/dL — ABNORMAL LOW (ref 6.5–8.1)

## 2022-05-07 LAB — MAGNESIUM: Magnesium: 1.5 mg/dL — ABNORMAL LOW (ref 1.7–2.4)

## 2022-05-07 LAB — GLUCOSE, CAPILLARY
Glucose-Capillary: 240 mg/dL — ABNORMAL HIGH (ref 70–99)
Glucose-Capillary: 231 mg/dL — ABNORMAL HIGH (ref 70–99)
Glucose-Capillary: 261 mg/dL — ABNORMAL HIGH (ref 70–99)
Glucose-Capillary: 256 mg/dL — ABNORMAL HIGH (ref 70–99)

## 2022-05-07 LAB — PHOSPHORUS: Phosphorus: 4.8 mg/dL — ABNORMAL HIGH (ref 2.5–4.6)

## 2022-05-07 LAB — HEPARIN LEVEL (UNFRACTIONATED): Heparin Unfractionated: 0.44 IU/mL (ref 0.30–0.70)

## 2022-05-07 MED ORDER — HEPARIN (PORCINE) 25000 UT/250ML-% IV SOLN
800.0000 [IU]/h | INTRAVENOUS | Status: DC
  Administered 2022-05-07 – 2022-05-08 (×2): 1300 [IU]/h via INTRAVENOUS
  Administered 2022-05-09: 950 [IU]/h via INTRAVENOUS
  Administered 2022-05-10: 800 [IU]/h via INTRAVENOUS
  Filled 2022-05-07 (×4): qty 250

## 2022-05-07 NOTE — Progress Notes (Signed)
ANTICOAGULATION CONSULT NOTE - Initial Consult  Pharmacy Consult for Heparin Indication: pulmonary embolus in June 2023  Allergies  Allergen Reactions   Ibuprofen Other (See Comments)    Pt is unable to take this due to kidney problems.      Penicillins Rash and Other (See Comments)    Has patient had a PCN reaction causing immediate rash, facial/tongue/throat swelling, SOB or lightheadedness with hypotension: No Has patient had a PCN reaction causing severe rash involving mucus membranes or skin necrosis: No Has patient had a PCN reaction that required hospitalization No Has patient had a PCN reaction occurring within the last 10 years: No If all of the above answers are "NO", then may proceed with Cephalosporin use.    Patient Measurements: Height: 5\' 5"  (165.1 cm) Weight: 95.2 kg (209 lb 14.1 oz) IBW/kg (Calculated) : 57 Heparin Dosing Weight: 79.2 kg  Vital Signs: Temp: 98.1 F (36.7 C) (07/16 0001) Temp Source: Oral (07/16 0001) BP: 129/75 (07/16 0001) Pulse Rate: 86 (07/16 0001)  Labs: Recent Labs    05/04/22 0951 05/05/22 0605 05/06/22 0500 05/07/22 0455  HGB 7.9* 8.1*  --  8.3*  HCT 23.3* 24.1*  --  25.0*  PLT 198 216  --  217  CREATININE  --  3.02* 1.72* 2.69*    Estimated Creatinine Clearance: 26.7 mL/min (A) (by C-G formula based on SCr of 2.69 mg/dL (H)).   Medical History: Past Medical History:  Diagnosis Date   Chronic cough    Chronic pain    Diabetes mellitus without complication (HCC)    Dyspnea    GERD (gastroesophageal reflux disease)    Hypokalemia    Hypothyroidism    Left wrist pain 06/01/2021   Myonecrosis (HCC) 07/01/2021   Neurocardiogenic syncope    OSA (obstructive sleep apnea)    does not use CPAP   Osteomyelitis (HCC)    bilateral feet   Peripheral neuropathy    Presence of intrathecal pump    recieves Prialt/bupivicaine   Tear of gluteus medius tendon 07/01/2021   Wegener's granulomatosis 2009   Wegner's disease (congenital  syphilitic osteochondritis)     Medications:  Scheduled:   amLODipine  10 mg Oral Daily   Chlorhexidine Gluconate Cloth  6 each Topical Daily   diclofenac Sodium  2 g Topical QID   DULoxetine  30 mg Oral Daily   folic acid  2 mg Oral Daily   guaiFENesin-dextromethorphan  10 mL Oral Q12H   insulin aspart  0-9 Units Subcutaneous TID WC   insulin glargine-yfgn  8 Units Subcutaneous QHS   linaclotide  145 mcg Oral QAC breakfast   pantoprazole  40 mg Oral Daily   predniSONE  15 mg Oral Q supper   predniSONE  20 mg Oral Q breakfast   senna  2 tablet Oral BID   vitamin B-12  1,000 mcg Oral Daily   Assessment: 56 yo F presenting with AMS/sepsis, history of chronic iron deficiency anemia. Patient was on Eliquis PTA for recent PE (03/27/2022). Eliquis was resumed on admission (6/26-7/2) and then discontinued, with transition to Lovenox (7/2-7/6). Last Lovenox dose on 7/6 - held d/t bleeding around patient's HD site and from hemorrhoid/fissure, with need for PRBC transfusion during this admission. Pharmacy now consulted for heparin dosing. Baseline CBC stable, no signs/symptoms of bleeding noted.   Goal of Therapy:  Heparin level 0.3-0.7 units/ml Monitor platelets by anticoagulation protocol: Yes   Plan:  No bolus. Start IV heparin at 1300 units/hr. Check ~8 hr  heparin level.  Daily CBC, heparin level. Monitor for signs/symptoms of bleeding.   Jerrilyn Cairo, PharmD PGY-2 Pharmacy Resident Phone [phone] 05/07/2022 8:15 AM   Please check AMION for all [facility] Pharmacy phone numbers After 10:00 PM, call Main Pharmacy [phone]

## 2022-05-07 NOTE — Progress Notes (Signed)
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  She was recently admitted prior to this hospitalization for concerns of pneumonia, underwent bronchoscopy which showed Candida and was seen by infectious disease.  Admitted again on 6/26 with acute renal failure and concerns of UTI/pneumonia therefore started on broad-spectrum antibiotics.  Renal function continued to worsen and nephrology team was consulted.  She also had change in mental status and neurology was consulted who performed EEG which was unremarkable and LP was put on hold.  Currently patient is getting hemodialysis.   Assessment & Plan:  Principal Problem:   Acute metabolic encephalopathy Active Problems:   Acute cystitis without hematuria   Current chronic use of systemic steroids   Chronic pain   Secondary adrenal insufficiency (HCC)   AKI (acute kidney injury) (HCC)   Granulomatosis with polyangiitis (HCC)   GERD (gastroesophageal reflux disease)   Essential hypertension   Obesity (BMI 30-39.9)   COVID-19 virus infection   Severe sepsis (HCC)   Pulmonary embolism (HCC)   Transaminitis   Rhabdomyolysis   Acute metabolic encephalopathy, POA, multifactorial. Improved.  -Multifactorial from sepsis renal failure and hyponatremia.  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  CNS depressant were discontinued except Cymbalta.  Intrathecal pain pump discontinued.  Per neurology holding off on LP.  CTA head due to slow improvement.  Neurology signed off 7/6.   AKI/ATN currently requiring HD -Multiple etiology.  Temporary HD catheter placed 7/3.  Patient is now more interactive  and alert.  HD sessions per Nephro service.  - Nephrology sent off multiple biomarkers: *Of note C4, and Kappa light chains* were elevated minimally but otherwise ANCA, ANA, RF, anti-dsDNA, c3, spep/sflc without abnormalities, hepatitis panel, HIV, EBV, CMV were unremarkable   Acute blood loss anemia on anemia of chronic kidney disease Gluteal hematoma -Does have history of chronic iron deficiency anemia as well.  Some bleeding around her HD catheter site and minimal rectal bleeding from hemorrhoid/fissure.  She required PRBC transfusion during the hospitalization.  Was also found to have gluteus hematoma for which Surgery was consulted who recommended transfusion as needed otherwise no surgical indication.  EGD in 2022-normal Colonoscopy-multiple diverticula, hemorrhoids with suspicion for fissure. Follows with [facility]    History of pulmonary embolism June 2023 -Lovenox on hold due to recurrent anemia requiring blood transfusion. LE dopplers was negative. Her Hb has remained stable for now, will start heparin drip and monitor. Discussed with Gen surgery 7/16 Dr Freida Busman   Polypharmacy risk -Current medications Cymbalta - Home medications included: bupivacaine/zinconotide intrathecal baclofen pump, duloxetine, gabapentin, methocarbamol, nucynta, oxycodone, Topamax.   Chronic pain, neuropathy, and chronic opioid dependence  - Patient has intrathecal pump with bupivacaine and ziconotide - turned down to lowest setting 7/3 (cannot stop completely per tech)    Granulomatosis with polyangiitis  -Follows outpatient now on 10 [facility].  Receiving rituximab and methotrexate. - Current medications prednisone 20 mg in the morning and 50 mg in the evening.  Slowly wean off to 5 mg daily  Secondary adrenal insufficiency  -Continue steroids    Current chronic use of systemic steroids -At home prednisone 5 mg daily.  Currently on 20/15 mg   Acute hypoxic  respiratory failure,  POA -Resolved now on room air   Presumed severe sepsis secondary to pneumonia versus UTI -Completed 5 days of antibiotic therapy   C. difficile colonization -C. difficile toxin is negative.  No indication of treatment at this time   GERD  -PPI    Rhabdomyolysis, resolved -Secondary to prolonged downtime    Elevated transaminitis -Most likely in the setting of sepsis/shock liver and rhabdomyolysis. -Improved    COVID-19 virus infection, questionably incidental -Incidental positive finding at the time of admission.  Remdesivir held due to elevated LFTs.  Completed isolation..   Diabetes mellitus type II, insulin-dependent -A1c 8.6.  Semglee 8 units at bedtime.  Sliding scale and Accu-Cheks.   Obesity  Estimated body mass index is 38.37 kg/m as calculated from the following:   Height as of this encounter: 5\' 5"  (1.651 m).   Weight as of this encounter: 104.6 kg.   PT/OT = SNF TOC aware   DVT prophylaxis: Place and maintain sequential compression device Start: 04/28/22 1251 Code Status: Full code Family Communication: Son at bedside  Status is: Inpatient Remains inpatient appropriate because: Maintain hosp stay until cleared by Nephro. Eventually will go to SNF, TOC aware.    Subjective: Patient feels well, no complaints.  Discussed use of heparin drip with her and her son.  They are agreeable at this time, understands the risk of bleeding.   Examination: Constitutional: Not in acute distress Respiratory: Clear to auscultation bilaterally Cardiovascular: Normal sinus rhythm, no rubs.  Trace bilateral lower extremity pitting edema Abdomen: Nontender nondistended good bowel sounds Musculoskeletal: Bilateral multiple toe amputations Skin: No rashes seen Neurologic: CN 2-12 grossly intact.  And nonfocal Psychiatric: Normal judgment and insight. Alert and oriented x 3. Normal mood.   Objective: Vitals:   05/06/22 0235 05/06/22 0800 05/06/22 2010 05/07/22 0001   BP: (!) 151/85 (!) 154/90 (!) 141/68 129/75  Pulse: 80 87 92 86  Resp: (!) 21 17 20 16   Temp: 98.2 F (36.8 C) 98.3 F (36.8 C) 98 F (36.7 C) 98.1 F (36.7 C)  TempSrc: Oral Oral Oral Oral  SpO2: 97% 97% 96% 100%  Weight:      Height:        Intake/Output Summary (Last 24 hours) at 05/07/2022 0759 Last data filed at 05/07/2022 0400 Gross per 24 hour  Intake --  Output 800 ml  Net -800 ml   Filed Weights   05/02/22 0421 05/05/22 1233 05/05/22 1654  Weight: 102.1 kg 95.2 kg 95.2 kg     Data Reviewed:   CBC: Recent Labs  Lab 05/01/22 0630 05/01/22 1101 05/02/22 0636 05/02/22 2220 05/03/22 0450 05/04/22 0951 05/05/22 0605 05/07/22 0455  WBC 19.3* 21.1* 17.6* 17.5* 17.2* 15.0* 13.1* 12.9*  NEUTROABS 17.9* 17.4* 15.8* 16.3* 12.5*  --   --   --   HGB 7.0* 8.9* 8.5* 8.8* 8.0* 7.9* 8.1* 8.3*  HCT 20.5* 25.8* 24.7* 25.6* 23.5* 23.3* 24.1* 25.0*  MCV 82.3 83.0 82.6 83.9 83.9 84.7 85.5 85.9  PLT 213 214 199 221 194 198 216 217   Basic Metabolic Panel: Recent Labs  Lab 05/03/22 0450 05/04/22 0500 05/05/22 0605 05/06/22 0500 05/07/22 0455  NA 137 141 142 139 141  K 4.1 4.4 4.4 4.2 4.5  CL 104 109 109 107 105  CO2 25 22 23 26 22   GLUCOSE 132* 145* 150* 140* 311*  BUN 47* 57* 66* 37* 57*  CREATININE 2.81* 3.00* 3.02* 1.72* 2.69*  CALCIUM 7.4* 7.6* 7.8* 7.8* 8.2*  MG  --   --   --   --  1.5*  PHOS 4.4 4.8* 5.7* 4.2 4.8*   GFR: Estimated Creatinine Clearance: 26.7 mL/min (A) (by C-G formula based on SCr of 2.69 mg/dL (H)). Liver Function Tests: Recent Labs  Lab 05/03/22 0450 05/04/22 0500 05/05/22 0605 05/06/22 0500 05/07/22 0455  AST  --   --   --   --  19  ALT  --   --   --   --  22  ALKPHOS  --   --   --   --  81  BILITOT  --   --   --   --  1.0  PROT  --   --   --   --  4.8*  ALBUMIN 2.1* 2.2* 2.3* 2.4* 2.6*   No results for input(s): "LIPASE", "AMYLASE" in the last 168 hours. No results for input(s): "AMMONIA" in the last 168 hours. Coagulation  Profile: No results for input(s): "INR", "PROTIME" in the last 168 hours. Cardiac Enzymes: No results for input(s): "CKTOTAL", "CKMB", "CKMBINDEX", "TROPONINI" in the last 168 hours. BNP (last 3 results) No results for input(s): "PROBNP" in the last 8760 hours. HbA1C: No results for input(s): "HGBA1C" in the last 72 hours. CBG: Recent Labs  Lab 05/06/22 0630 05/06/22 1201 05/06/22 1627 05/06/22 2125 05/07/22 0612  GLUCAP 111* 130* 142* 202* 240*   Lipid Profile: No results for input(s): "CHOL", "HDL", "LDLCALC", "TRIG", "CHOLHDL", "LDLDIRECT" in the last 72 hours. Thyroid Function Tests: No results for input(s): "TSH", "T4TOTAL", "FREET4", "T3FREE", "THYROIDAB" in the last 72 hours. Anemia Panel: No results for input(s): "VITAMINB12", "FOLATE", "FERRITIN", "TIBC", "IRON", "RETICCTPCT" in the last 72 hours. Sepsis Labs: No results for input(s): "PROCALCITON", "LATICACIDVEN" in the last 168 hours.  No results found for this or any previous visit (from the past 240 hour(s)).       Radiology Studies: No results found.      Scheduled Meds:  amLODipine  10 mg Oral Daily   Chlorhexidine Gluconate Cloth  6 each Topical Daily   diclofenac Sodium  2 g Topical QID   DULoxetine  30 mg Oral Daily   folic acid  2 mg Oral Daily   guaiFENesin-dextromethorphan  10 mL Oral Q12H   insulin aspart  0-9 Units Subcutaneous TID WC   insulin glargine-yfgn  8 Units Subcutaneous QHS   linaclotide  145 mcg Oral QAC breakfast   pantoprazole  40 mg Oral Daily   predniSONE  15 mg Oral Q supper   predniSONE  20 mg Oral Q breakfast   senna  2 tablet Oral BID   vitamin B-12  1,000 mcg Oral Daily   Continuous Infusions:   LOS: 20 days   Time spent= 35 mins    Maudene Stotler Joline Maxcy, MD [facility]  If 7PM-7AM, please contact night-coverage  05/07/2022, 7:59 AM

## 2022-05-07 NOTE — Progress Notes (Addendum)
ANTICOAGULATION CONSULT NOTE - follow up  Pharmacy Consult for Heparin Indication: pulmonary embolus in June 2023  Allergies  Allergen Reactions   Ibuprofen Other (See Comments)    Pt is unable to take this due to kidney problems.      Penicillins Rash and Other (See Comments)    Has patient had a PCN reaction causing immediate rash, facial/tongue/throat swelling, SOB or lightheadedness with hypotension: No Has patient had a PCN reaction causing severe rash involving mucus membranes or skin necrosis: No Has patient had a PCN reaction that required hospitalization No Has patient had a PCN reaction occurring within the last 10 years: No If all of the above answers are "NO", then may proceed with Cephalosporin use.    Patient Measurements: Height: 5\' 5"  (165.1 cm) Weight: 95.2 kg (209 lb 14.1 oz) IBW/kg (Calculated) : 57 Heparin Dosing Weight: 79.2 kg  Vital Signs:    Labs: Recent Labs    05/05/22 0605 05/06/22 0500 05/07/22 0455 05/07/22 1800  HGB 8.1*  --  8.3*  --   HCT 24.1*  --  25.0*  --   PLT 216  --  217  --   HEPARINUNFRC  --   --   --  0.44  CREATININE 3.02* 1.72* 2.69*  --      Estimated Creatinine Clearance: 26.7 mL/min (A) (by C-G formula based on SCr of 2.69 mg/dL (H)).   Medical History: Past Medical History:  Diagnosis Date   Chronic cough    Chronic pain    Diabetes mellitus without complication (HCC)    Dyspnea    GERD (gastroesophageal reflux disease)    Hypokalemia    Hypothyroidism    Left wrist pain 06/01/2021   Myonecrosis (HCC) 07/01/2021   Neurocardiogenic syncope    OSA (obstructive sleep apnea)    does not use CPAP   Osteomyelitis (HCC)    bilateral feet   Peripheral neuropathy    Presence of intrathecal pump    recieves Prialt/bupivicaine   Tear of gluteus medius tendon 07/01/2021   Wegener's granulomatosis 2009   Wegner's disease (congenital syphilitic osteochondritis)     Medications:  Scheduled:   amLODipine  10 mg Oral  Daily   Chlorhexidine Gluconate Cloth  6 each Topical Daily   diclofenac Sodium  2 g Topical QID   DULoxetine  30 mg Oral Daily   folic acid  2 mg Oral Daily   guaiFENesin-dextromethorphan  10 mL Oral Q12H   insulin aspart  0-9 Units Subcutaneous TID WC   insulin glargine-yfgn  8 Units Subcutaneous QHS   linaclotide  145 mcg Oral QAC breakfast   pantoprazole  40 mg Oral Daily   predniSONE  15 mg Oral Q supper   predniSONE  20 mg Oral Q breakfast   senna  2 tablet Oral BID   vitamin B-12  1,000 mcg Oral Daily   Assessment: 56 yo F presenting with AMS/sepsis, history of chronic iron deficiency anemia. Patient was on Eliquis PTA for recent PE (03/27/2022). Eliquis was resumed on admission (6/26-7/2) and then discontinued, with transition to Lovenox (7/2-7/6). Last Lovenox dose on 7/6 - held d/t bleeding around patient's HD site and from hemorrhoid/fissure, with need for PRBC transfusion during this admission. Pharmacy now consulted for heparin dosing.    8 hour heparin level is 0.44, therapeutic on heparin 1300 units/hr.   This morning  Hgb 8.3 stable , pltc 217 wnl /stable. No signs/symptoms of bleeding noted.  Has required blood transfusions this admit.  Goal of Therapy:  Heparin level 0.3-0.7 units/ml Monitor platelets by anticoagulation protocol: Yes   Plan:  Continue IV heparin at 1300 units/hr. Check ~6 hr heparin level to confirm remains therapeutic. Daily CBC, heparin level. Monitor for signs/symptoms of bleeding.   Noah Delaine, RPh Clinical Pharmacist  05/07/2022 7:13 PM   Please check AMION for all [facility] Pharmacy phone numbers After 10:00 PM, call Main Pharmacy [phone]

## 2022-05-07 NOTE — Plan of Care (Signed)
Problem: Education: Goal: Knowledge of General Education information will improve Description: Including pain rating scale, medication(s)/side effects and non-pharmacologic comfort measures Outcome: Progressing   Problem: Health Behavior/Discharge Planning: Goal: Ability to manage health-related needs will improve Outcome: Progressing   Problem: Clinical Measurements: Goal: Ability to maintain clinical measurements within normal limits will improve Outcome: Progressing Goal: Will remain free from infection Outcome: Progressing Goal: Diagnostic test results will improve Outcome: Progressing Goal: Respiratory complications will improve Outcome: Progressing Goal: Cardiovascular complication will be avoided Outcome: Progressing   Problem: Activity: Goal: Risk for activity intolerance will decrease Outcome: Progressing   Problem: Nutrition: Goal: Adequate nutrition will be maintained Outcome: Progressing   Problem: Coping: Goal: Level of anxiety will decrease Outcome: Progressing   Problem: Elimination: Goal: Will not experience complications related to bowel motility Outcome: Progressing Goal: Will not experience complications related to urinary retention Outcome: Progressing   Problem: Pain Managment: Goal: General experience of comfort will improve Outcome: Progressing   Problem: Safety: Goal: Ability to remain free from injury will improve Outcome: Progressing   Problem: Skin Integrity: Goal: Risk for impaired skin integrity will decrease Outcome: Progressing   Problem: Education: Goal: Ability to describe self-care measures that may prevent or decrease complications (Diabetes Survival Skills Education) will improve Outcome: Progressing Goal: Individualized Educational Video(s) Outcome: Progressing   Problem: Coping: Goal: Ability to adjust to condition or change in health will improve Outcome: Progressing   Problem: Fluid Volume: Goal: Ability to  maintain a balanced intake and output will improve Outcome: Progressing   Problem: Health Behavior/Discharge Planning: Goal: Ability to identify and utilize available resources and services will improve Outcome: Progressing Goal: Ability to manage health-related needs will improve Outcome: Progressing   Problem: Metabolic: Goal: Ability to maintain appropriate glucose levels will improve Outcome: Progressing   Problem: Nutritional: Goal: Maintenance of adequate nutrition will improve Outcome: Progressing Goal: Progress toward achieving an optimal weight will improve Outcome: Progressing   Problem: Skin Integrity: Goal: Risk for impaired skin integrity will decrease Outcome: Progressing   Problem: Tissue Perfusion: Goal: Adequacy of tissue perfusion will improve Outcome: Progressing   Problem: Education: Goal: Knowledge of General Education information will improve Description: Including pain rating scale, medication(s)/side effects and non-pharmacologic comfort measures Outcome: Progressing   Problem: Health Behavior/Discharge Planning: Goal: Ability to manage health-related needs will improve Outcome: Progressing   Problem: Clinical Measurements: Goal: Ability to maintain clinical measurements within normal limits will improve Outcome: Progressing Goal: Will remain free from infection Outcome: Progressing Goal: Diagnostic test results will improve Outcome: Progressing Goal: Respiratory complications will improve Outcome: Progressing Goal: Cardiovascular complication will be avoided Outcome: Progressing   Problem: Activity: Goal: Risk for activity intolerance will decrease Outcome: Progressing   Problem: Nutrition: Goal: Adequate nutrition will be maintained Outcome: Progressing   Problem: Coping: Goal: Level of anxiety will decrease Outcome: Progressing   Problem: Elimination: Goal: Will not experience complications related to bowel motility Outcome:  Progressing Goal: Will not experience complications related to urinary retention Outcome: Progressing   Problem: Pain Managment: Goal: General experience of comfort will improve Outcome: Progressing   Problem: Safety: Goal: Ability to remain free from injury will improve Outcome: Progressing   Problem: Skin Integrity: Goal: Risk for impaired skin integrity will decrease Outcome: Progressing   Problem: Education: Goal: Ability to describe self-care measures that may prevent or decrease complications (Diabetes Survival Skills Education) will improve Outcome: Progressing Goal: Individualized Educational Video(s) Outcome: Progressing   Problem: Coping: Goal: Ability to adjust to condition or change   in health will improve Outcome: Progressing   Problem: Fluid Volume: Goal: Ability to maintain a balanced intake and output will improve Outcome: Progressing   Problem: Health Behavior/Discharge Planning: Goal: Ability to identify and utilize available resources and services will improve Outcome: Progressing Goal: Ability to manage health-related needs will improve Outcome: Progressing   Problem: Metabolic: Goal: Ability to maintain appropriate glucose levels will improve Outcome: Progressing   Problem: Nutritional: Goal: Maintenance of adequate nutrition will improve Outcome: Progressing Goal: Progress toward achieving an optimal weight will improve Outcome: Progressing   Problem: Skin Integrity: Goal: Risk for impaired skin integrity will decrease Outcome: Progressing   Problem: Tissue Perfusion: Goal: Adequacy of tissue perfusion will improve Outcome: Progressing   

## 2022-05-07 NOTE — Progress Notes (Signed)
Patient ID: [name], female   DOB: 1965-12-05, 56 y.o.   MRN: [id number] [facility] Progress Note   Assessment/ Plan:   1. Acute kidney Injury: Normal renal function at baseline now with AKI in setting of UTI.  Etiology though to be hemodynamically mediated in setting of UTI and NOT suspected to be related to her history of ANCA associated GPA based on recent rituximab administration in May and urinalysis not suggestive of GN but rather UTI.  Repeated UA 7/11 - looks consistent with foley specimen not GN.  Weighing risk:benefit of renal biopsy that I think would be low yield I recommend against a renal biopsy at this time.   She had HD x 2 in past week for possible uremia (dec alertness) with BUN in the 60s - last on 7/14.  Seems sensitive to BUN elevations even below 100.  Will continue to do daily assessment for ongoing dialytic needs --> UOP has improved and I am hopeful we'll see more substantial improvement in renal function in the coming days.  Maintain HD cath for now.   Foley out and has purewick.  Still need I/Os.  PVRs ok.  UOP improved.    2.  Anemia: Previously with some bleeding around her dialysis catheter and some transient hematochezia.  Status post PRBC transfusions, CT 7/10 with gluteal hematoma for which surgery was consulted and is observing for now.  Hb 8s, transfuse < 7.  3.  Altered mental status: Suspected to be multifactorial etiology with lumbar puncture deferred after improvement of mental status with dialysis and reduction of baclofen rate.  MS again worse and had HD 7/11, felt improved; same situation 7/14 - did dialysis and they feel improved.   Robaxin and balofen definitely made things worse and these have been d/c'd  4.  Sepsis: Suspected to be of pneumonia/urinary tract infection etiology and status post completion of antibiotics. 5.  Granulomatosis with polyangiitis: On prednisone dose decreased now to 35 mg daily at this time with previous rituximab  administered in May.  Dx 15y ago = on chronic methotrexate/rituximab and pred ( sees Dr. Talbert Cage with rheumatology). Per above doubt renal GPA and no plans for renal biopsy at this time.  6.  COVID-19 virus infection: Incidental positive COVID PCR found at admission.  Remains on corticosteroids and did not get remdesivir due to elevated LFTs. 7.  Adrenal insufficiency 8  H/O PE: 03/2022 on CTA; had been on apixiban but held due to anemia requiring repeated transfusion - ? If needs rechallenge with AC vs IVC filter? Messaged primary.  Will continue to follow.  Needs PT/OT eval moving towards dispo.  Will continue to follow.  Subjective:   son bedside Agree she's doing well today UOP yesterday recorded Has increased po intake, no dysgeusia   Objective:   BP 129/75 (BP Location: Left Arm)   Pulse 86   Temp 98.1 F (36.7 C) (Oral)   Resp 16   Ht 5\' 5"  (1.651 m)   Wt 95.2 kg   SpO2 100%   BMI 34.93 kg/m   Intake/Output Summary (Last 24 hours) at 05/07/2022 1137 Last data filed at 05/07/2022 0400 Gross per 24 hour  Intake --  Output 800 ml  Net -800 ml    Weight change:   Physical Exam: Gen: Appears comfortable resting in bed but drowsy, husband sitting at bedside  CVS: Pulse regular rhythm, normal rate, S1 and S2 normal Resp: Transmitted breath sounds bilaterally without rales/rhonchi Abd: Soft, obese,  nontender, bowel sounds normal Ext: Trace lower extremity edema.  Bilateral forefoot amputations noted Neuro: speaking and answering questions today  GU :purewick with yellow clear urine  Imaging: No results found.  Labs: BMET Recent Labs  Lab 05/01/22 0630 05/02/22 0636 05/03/22 0450 05/04/22 0500 05/05/22 0605 05/06/22 0500 05/07/22 0455  NA 134* 138 137 141 142 139 141  K 4.0 3.9 4.1 4.4 4.4 4.2 4.5  CL 97* 100 104 109 109 107 105  CO2 25 28 25 22 23 26 22   GLUCOSE 133* 136* 132* 145* 150* 140* 311*  BUN 62* 35* 47* 57* 66* 37* 57*  CREATININE 4.02*   4.11* 2.43* 2.81* 3.00* 3.02* 1.72* 2.69*  CALCIUM 7.3* 7.2* 7.4* 7.6* 7.8* 7.8* 8.2*  PHOS 6.2* 3.8 4.4 4.8* 5.7* 4.2 4.8*    CBC Recent Labs  Lab 05/01/22 1101 05/02/22 0636 05/02/22 2220 05/03/22 0450 05/04/22 0951 05/05/22 0605 05/07/22 0455  WBC 21.1* 17.6* 17.5* 17.2* 15.0* 13.1* 12.9*  NEUTROABS 17.4* 15.8* 16.3* 12.5*  --   --   --   HGB 8.9* 8.5* 8.8* 8.0* 7.9* 8.1* 8.3*  HCT 25.8* 24.7* 25.6* 23.5* 23.3* 24.1* 25.0*  MCV 83.0 82.6 83.9 83.9 84.7 85.5 85.9  PLT 214 199 221 194 198 216 217     Medications:     amLODipine  10 mg Oral Daily   Chlorhexidine Gluconate Cloth  6 each Topical Daily   diclofenac Sodium  2 g Topical QID   DULoxetine  30 mg Oral Daily   folic acid  2 mg Oral Daily   guaiFENesin-dextromethorphan  10 mL Oral Q12H   insulin aspart  0-9 Units Subcutaneous TID WC   insulin glargine-yfgn  8 Units Subcutaneous QHS   linaclotide  145 mcg Oral QAC breakfast   pantoprazole  40 mg Oral Daily   predniSONE  15 mg Oral Q supper   predniSONE  20 mg Oral Q breakfast   senna  2 tablet Oral BID   vitamin B-12  1,000 mcg Oral Daily  05/09/22 MD [facility] Pager [phone]

## 2022-05-08 ENCOUNTER — Ambulatory Visit (HOSPITAL_COMMUNITY): Payer: BC Managed Care – PPO

## 2022-05-08 DIAGNOSIS — G9341 Metabolic encephalopathy: Secondary | ICD-10-CM | POA: Diagnosis not present

## 2022-05-08 LAB — CBC
HCT: 25.1 % — ABNORMAL LOW (ref 36.0–46.0)
Hemoglobin: 8.2 g/dL — ABNORMAL LOW (ref 12.0–15.0)
MCH: 28.6 pg (ref 26.0–34.0)
MCHC: 32.7 g/dL (ref 30.0–36.0)
MCV: 87.5 fL (ref 80.0–100.0)
Platelets: 257 10*3/uL (ref 150–400)
RBC: 2.87 MIL/uL — ABNORMAL LOW (ref 3.87–5.11)
RDW: 17.2 % — ABNORMAL HIGH (ref 11.5–15.5)
WBC: 15 10*3/uL — ABNORMAL HIGH (ref 4.0–10.5)
nRBC: 0 % (ref 0.0–0.2)

## 2022-05-08 LAB — COMPREHENSIVE METABOLIC PANEL
ALT: 21 U/L (ref 0–44)
AST: 15 U/L (ref 15–41)
Albumin: 2.8 g/dL — ABNORMAL LOW (ref 3.5–5.0)
Alkaline Phosphatase: 73 U/L (ref 38–126)
Anion gap: 9 (ref 5–15)
BUN: 66 mg/dL — ABNORMAL HIGH (ref 6–20)
CO2: 23 mmol/L (ref 22–32)
Calcium: 8.3 mg/dL — ABNORMAL LOW (ref 8.9–10.3)
Chloride: 113 mmol/L — ABNORMAL HIGH (ref 98–111)
Creatinine, Ser: 2.63 mg/dL — ABNORMAL HIGH (ref 0.44–1.00)
GFR, Estimated: 21 mL/min — ABNORMAL LOW (ref >60)
Glucose, Bld: 143 mg/dL — ABNORMAL HIGH (ref 70–99)
Potassium: 4.2 mmol/L (ref 3.5–5.1)
Sodium: 145 mmol/L (ref 135–145)
Total Bilirubin: 1 mg/dL (ref 0.3–1.2)
Total Protein: 5.1 g/dL — ABNORMAL LOW (ref 6.5–8.1)

## 2022-05-08 LAB — GLUCOSE, CAPILLARY
Glucose-Capillary: 166 mg/dL — ABNORMAL HIGH (ref 70–99)
Glucose-Capillary: 155 mg/dL — ABNORMAL HIGH (ref 70–99)
Glucose-Capillary: 202 mg/dL — ABNORMAL HIGH (ref 70–99)
Glucose-Capillary: 269 mg/dL — ABNORMAL HIGH (ref 70–99)

## 2022-05-08 LAB — RENAL FUNCTION PANEL
Albumin: 2.8 g/dL — ABNORMAL LOW (ref 3.5–5.0)
Anion gap: 9 (ref 5–15)
BUN: 63 mg/dL — ABNORMAL HIGH (ref 6–20)
CO2: 22 mmol/L (ref 22–32)
Calcium: 8.2 mg/dL — ABNORMAL LOW (ref 8.9–10.3)
Chloride: 113 mmol/L — ABNORMAL HIGH (ref 98–111)
Creatinine, Ser: 2.58 mg/dL — ABNORMAL HIGH (ref 0.44–1.00)
GFR, Estimated: 21 mL/min — ABNORMAL LOW (ref >60)
Glucose, Bld: 142 mg/dL — ABNORMAL HIGH (ref 70–99)
Phosphorus: 4.1 mg/dL (ref 2.5–4.6)
Potassium: 4.2 mmol/L (ref 3.5–5.1)
Sodium: 144 mmol/L (ref 135–145)

## 2022-05-08 LAB — HEPARIN LEVEL (UNFRACTIONATED)
Heparin Unfractionated: 0.96 IU/mL — ABNORMAL HIGH (ref 0.30–0.70)
Heparin Unfractionated: 1.05 IU/mL — ABNORMAL HIGH (ref 0.30–0.70)
Heparin Unfractionated: 0.94 IU/mL — ABNORMAL HIGH (ref 0.30–0.70)

## 2022-05-08 LAB — MAGNESIUM: Magnesium: 1.5 mg/dL — ABNORMAL LOW (ref 1.7–2.4)

## 2022-05-08 NOTE — Progress Notes (Signed)
ANTICOAGULATION CONSULT NOTE - follow up  Pharmacy Consult for Heparin Indication: pulmonary embolus in June 2023  Allergies  Allergen Reactions   Ibuprofen Other (See Comments)    Pt is unable to take this due to kidney problems.      Penicillins Rash and Other (See Comments)    Has patient had a PCN reaction causing immediate rash, facial/tongue/throat swelling, SOB or lightheadedness with hypotension: No Has patient had a PCN reaction causing severe rash involving mucus membranes or skin necrosis: No Has patient had a PCN reaction that required hospitalization No Has patient had a PCN reaction occurring within the last 10 years: No If all of the above answers are "NO", then may proceed with Cephalosporin use.    Patient Measurements: Height: 5\' 5"  (165.1 cm) Weight: 95.2 kg (209 lb 14.1 oz) IBW/kg (Calculated) : 57 Heparin Dosing Weight: 79.2 kg  Vital Signs: Temp: 98.5 F (36.9 C) (07/17 0817) Temp Source: Oral (07/17 0420) BP: 150/91 (07/17 0817) Pulse Rate: 94 (07/17 0817)  Labs: Recent Labs    05/06/22 0500 05/07/22 0455 05/07/22 1800 05/08/22 0810  HGB  --  8.3*  --  8.2*  HCT  --  25.0*  --  25.1*  PLT  --  217  --  257  HEPARINUNFRC  --   --  0.44 0.96*  CREATININE 1.72* 2.69*  --  2.63*  2.58*     Estimated Creatinine Clearance: 27.8 mL/min (A) (by C-G formula based on SCr of 2.58 mg/dL (H)).   Medical History: Past Medical History:  Diagnosis Date   Chronic cough    Chronic pain    Diabetes mellitus without complication (HCC)    Dyspnea    GERD (gastroesophageal reflux disease)    Hypokalemia    Hypothyroidism    Left wrist pain 06/01/2021   Myonecrosis (HCC) 07/01/2021   Neurocardiogenic syncope    OSA (obstructive sleep apnea)    does not use CPAP   Osteomyelitis (HCC)    bilateral feet   Peripheral neuropathy    Presence of intrathecal pump    recieves Prialt/bupivicaine   Tear of gluteus medius tendon 07/01/2021   Wegener's  granulomatosis 2009   Wegner's disease (congenital syphilitic osteochondritis)     Medications:  Scheduled:   amLODipine  10 mg Oral Daily   Chlorhexidine Gluconate Cloth  6 each Topical Daily   diclofenac Sodium  2 g Topical QID   DULoxetine  30 mg Oral Daily   folic acid  2 mg Oral Daily   guaiFENesin-dextromethorphan  10 mL Oral Q12H   insulin aspart  0-9 Units Subcutaneous TID WC   insulin glargine-yfgn  8 Units Subcutaneous QHS   linaclotide  145 mcg Oral QAC breakfast   pantoprazole  40 mg Oral Daily   predniSONE  15 mg Oral Q supper   predniSONE  20 mg Oral Q breakfast   senna  2 tablet Oral BID   vitamin B-12  1,000 mcg Oral Daily   Assessment: 56 yo F presenting with AMS/sepsis, history of chronic iron deficiency anemia. Patient was on Eliquis PTA for recent PE (03/27/2022). Eliquis was resumed on admission (6/26-7/2) and then discontinued, with transition to Lovenox (7/2-7/6). Last Lovenox dose on 7/6 - held d/t bleeding around patient's HD site and from hemorrhoid/fissure, with need for PRBC transfusion during this admission. Pharmacy now consulted for heparin dosing.    8 hour heparin level is 0.44, therapeutic on heparin 1300 units/hr.   This morning  Hgb  8.3 stable , pltc 217 wnl /stable. No signs/symptoms of bleeding noted.  Has required blood transfusions this admit.   7/17- HL 0.96 supratherapeutic. No signs of bleeding per RN.    Goal of Therapy:  Heparin level 0.3-0.7 units/ml Monitor platelets by anticoagulation protocol: Yes   Plan:  Decrease IV heparin to 1150 units/hr. Check heparin level at 1900 Daily CBC, heparin level. Monitor for signs/symptoms of bleeding.   Greta Doom BS, PharmD, BCPS Clinical Pharmacist 05/08/2022 11:28 AM  Contact: [phone] after 3 PM  "Be curious, not judgmental..." -Debbora Dus

## 2022-05-08 NOTE — Progress Notes (Signed)
Physical Therapy Treatment Patient Details Name: [name] MRN: [id number] DOB: January 02, 1966 Today's Date: 05/08/2022   History of Present Illness Pt is a 55 y.o. female admitted 6/26 after being found by her husband on the floor at home with AMS. Admitted with dx of acute metabolic encephalopathy. Incidental COVID+ on admission. Initially admitted to [facility] and transferred to [facility] 7/1 for initiation of HD. Multiple recent hospitalizations, 4/3-5/4 and 6/4-6/14. PMH: PE, Wegener's disease, CKD 3, DM, HTN, chronic pain with an intrathecal pump, OSA, bilat mid-foot amputations, peripheral neuropathy    PT Comments    Continues to work hard with therapy and is making notable progress. Reduced assistance with bed mobility (min A) to reach EOB. Max A +2 for sit<> stand from bed with stedy. Tolerated multiple bouts of standing with Stedy to block knees. Patient will continue to benefit from skilled physical therapy services to further improve independence with functional mobility.    Recommendations for follow up therapy are one component of a multi-disciplinary discharge planning process, led by the attending physician.  Recommendations may be updated based on patient status, additional functional criteria and insurance authorization.  Follow Up Recommendations  Skilled nursing-short term rehab (<3 hours/day) Can patient physically be transported by private vehicle: No   Assistance Recommended at Discharge Frequent or constant Supervision/Assistance  Patient can return home with the following Two people to help with walking and/or transfers;Assistance with cooking/housework;Assist for transportation;Two people to help with bathing/dressing/bathroom;Help with stairs or ramp for entrance   Equipment Recommendations  Other (comment) (hoyer lift)    Recommendations for Other Services       Precautions / Restrictions Precautions Precautions: Fall Precaution Comments: h/o bilateral transmet  amputations, central line Restrictions Weight Bearing Restrictions: No     Mobility  Bed Mobility Overal bed mobility: Needs Assistance Bed Mobility: Supine to Sit     Supine to sit: Min assist     General bed mobility comments: Min assist for trunk support to rise to EOB. Pt requires extra time. Cues throughout.    Transfers Overall transfer level: Needs assistance Equipment used: Rolling walker (2 wheels) Transfers: Sit to/from Stand Sit to Stand: Max assist, From elevated surface, +2 physical assistance           General transfer comment: Max assist +2 for boost to stand from bed onto stedy. Attempted with +1 assist however not strong enough to tolerate. Once on Stedy, pt practiced sit<>stand from [place] with cues to tuck elbows for increased torqure in arms when rising which was helpful for Mod assist +1 on this device.    Ambulation/Gait Ambulation/Gait assistance:  (unable due to weakness and poor standing trials with increased assistance.)                 Stairs             Wheelchair Mobility    Modified Rankin (Stroke Patients Only)       Balance Overall balance assessment: Needs assistance Sitting-balance support: No upper extremity supported Sitting balance-Leahy Scale: Good Sitting balance - Comments: Balance much improved this session.   Standing balance support: Bilateral upper extremity supported, Reliant on assistive device for balance Standing balance-Leahy Scale: Poor Standing balance comment: Stands on Stedy, requires knee block, Lt knee buckling, Rt knee unable to fully extend. Tolerated approx several attempts up to 2 min in standing with knees blocked on stedy. Fatigues easily.  Cognition Arousal/Alertness: Awake/alert Behavior During Therapy: Flat affect Overall Cognitive Status: Difficult to assess                                 General Comments: Following commands, some  delayed proccessing noted. Oriented.        Exercises      General Comments General comments (skin integrity, edema, etc.): VSS      Pertinent Vitals/Pain Pain Assessment Pain Assessment: Faces Faces Pain Scale: Hurts even more Pain Location: Rt leg Pain Descriptors / Indicators: Discomfort, Grimacing Pain Intervention(s): Limited activity within patient's tolerance, Monitored during session, Repositioned    Home Living                          Prior Function            PT Goals (current goals can now be found in the care plan section) Acute Rehab PT Goals Patient Stated Goal: home PT Goal Formulation: With patient Time For Goal Achievement: 05/12/22 Potential to Achieve Goals: Good Progress towards PT goals: Progressing toward goals    Frequency    Min 3X/week      PT Plan Current plan remains appropriate    Co-evaluation              AM-PAC PT "6 Clicks" Mobility   Outcome Measure  Help needed turning from your back to your side while in a flat bed without using bedrails?: A Little Help needed moving from lying on your back to sitting on the side of a flat bed without using bedrails?: A Little Help needed moving to and from a bed to a chair (including a wheelchair)?: Total Help needed standing up from a chair using your arms (e.g., wheelchair or bedside chair)?: Total Help needed to walk in hospital room?: Total Help needed climbing 3-5 steps with a railing? : Total 6 Click Score: 10    End of Session Equipment Utilized During Treatment: Gait belt Activity Tolerance: Patient tolerated treatment well;Patient limited by fatigue Patient left: with call bell/phone within reach;in chair;with chair alarm set;with nursing/sitter in room (w/ lift pad under patient bottom.) Nurse Communication: Mobility status;Need for lift equipment PT Visit Diagnosis: Muscle weakness (generalized) (M62.81);Other abnormalities of gait and mobility (R26.89)      Time: 7371-0626 PT Time Calculation (min) (ACUTE ONLY): 29 min  Charges:  $Therapeutic Activity: 8-22 mins $Neuromuscular Re-education: 8-22 mins                     Kathlyn Sacramento, PT    [place] Mount 05/08/2022, 5:40 PM

## 2022-05-08 NOTE — Plan of Care (Signed)

## 2022-05-08 NOTE — Progress Notes (Signed)
Patient ID: [name], female   DOB: August 02, 1966, 56 y.o.   MRN: [id number] [facility] Progress Note   Assessment/ Plan:   1. Acute kidney Injury: Normal renal function at baseline now with AKI in setting of UTI.  Etiology though to be hemodynamically mediated in setting of UTI and NOT suspected to be related to her history of ANCA associated GPA based on recent rituximab administration in May and urinalysis not suggestive of GN but rather UTI.  Repeated UA 7/11 - looks consistent with foley specimen not GN.  Weighing risk:benefit of renal biopsy that I think would be low yield I recommend against a renal biopsy at this time.   She had HD x 2 in past week for possible uremia (dec alertness) with BUN in the 60s - last on 7/14.  Seems sensitive to BUN elevations even below 100.  Will continue to do daily assessment for ongoing dialytic needs --> UOP has improved and still hoping she will have continued recovery. Cr stable today. Will hold off on HD today. Maintain HD cath for now.  -Strict I/O, maintain purewick. Cont to check PVR's and can do in and out catheterization if needed  2.  Anemia: Previously with some bleeding around her dialysis catheter and some transient hematochezia.  Status post PRBC transfusions, CT 7/10 with gluteal hematoma for which surgery was consulted and is observing for now.  Hb 8s, transfuse < 7.  3.  Altered mental status: Suspected to be multifactorial etiology with lumbar puncture deferred after improvement of mental status with dialysis and reduction of baclofen rate.  MS again worse and had HD 7/11, felt improved; same situation 7/14 - did dialysis and they feel improved.   Robaxin and balofen definitely made things worse and these have been d/c'd  -watch for now, no indications for HD today 4.  Sepsis: Suspected to be of pneumonia/urinary tract infection etiology and status post completion of antibiotics. 5.  Granulomatosis with polyangiitis: On prednisone  dose decreased now to 35 mg daily at this time with previous rituximab administered in May.  Dx 15y ago = on chronic methotrexate/rituximab and pred ( sees Dr. Talbert Cage with rheumatology). Per above doubt renal GPA and no plans for renal biopsy at this time.  6.  COVID-19 virus infection: Incidental positive COVID PCR found at admission.  Remains on corticosteroids and did not get remdesivir due to elevated LFTs. 7.  Adrenal insufficiency 8  H/O PE: 03/2022 on CTA; had been on apixiban but held due to anemia requiring repeated transfusion - currently on heparin gtt-per primary  Will continue to follow.  Needs PT/OT eval moving towards dispo.  Will continue to follow.  Subjective:   No acute events. No complaints Uop ~2.4L   Objective:   BP (!) 150/91 (BP Location: Left Arm)   Pulse 94   Temp 98.5 F (36.9 C)   Resp 16   Ht 5\' 5"  (1.651 m)   Wt 95.2 kg   SpO2 99%   BMI 34.93 kg/m   Intake/Output Summary (Last 24 hours) at 05/08/2022 1000 Last data filed at 05/08/2022 05/10/2022 Gross per 24 hour  Intake 372.84 ml  Output 2480 ml  Net -2107.16 ml   Weight change:   Physical Exam: Gen: NAD CVS: Pulse regular rhythm, normal rate, S1 and S2 normal Resp: Transmitted breath sounds bilaterally without rales/rhonchi Abd: Soft, obese, nontender, bowel sounds normal Ext: Trace lower extremity edema.  Bilateral forefoot amputations noted Neuro: awake, alert Dialysis access: RIJ  temp line c/d/i  Imaging: No results found.  Labs: BMET Recent Labs  Lab 05/02/22 0636 05/03/22 0450 05/04/22 0500 05/05/22 0605 05/06/22 0500 05/07/22 0455 05/08/22 0810  NA 138 137 141 142 139 141 145  144  K 3.9 4.1 4.4 4.4 4.2 4.5 4.2  4.2  CL 100 104 109 109 107 105 113*  113*  CO2 28 25 22 23 26 22 23  22   GLUCOSE 136* 132* 145* 150* 140* 311* 143*  142*  BUN 35* 47* 57* 66* 37* 57* 66*  63*  CREATININE 2.43* 2.81* 3.00* 3.02* 1.72* 2.69* 2.63*  2.58*  CALCIUM 7.2* 7.4* 7.6* 7.8* 7.8* 8.2*  8.3*  8.2*  PHOS 3.8 4.4 4.8* 5.7* 4.2 4.8* 4.1   CBC Recent Labs  Lab 05/01/22 1101 05/02/22 0636 05/02/22 2220 05/03/22 0450 05/04/22 0951 05/05/22 0605 05/07/22 0455 05/08/22 0810  WBC 21.1* 17.6* 17.5* 17.2* 15.0* 13.1* 12.9* 15.0*  NEUTROABS 17.4* 15.8* 16.3* 12.5*  --   --   --   --   HGB 8.9* 8.5* 8.8* 8.0* 7.9* 8.1* 8.3* 8.2*  HCT 25.8* 24.7* 25.6* 23.5* 23.3* 24.1* 25.0* 25.1*  MCV 83.0 82.6 83.9 83.9 84.7 85.5 85.9 87.5  PLT 214 199 221 194 198 216 217 257    Medications:     amLODipine  10 mg Oral Daily   Chlorhexidine Gluconate Cloth  6 each Topical Daily   diclofenac Sodium  2 g Topical QID   DULoxetine  30 mg Oral Daily   folic acid  2 mg Oral Daily   guaiFENesin-dextromethorphan  10 mL Oral Q12H   insulin aspart  0-9 Units Subcutaneous TID WC   insulin glargine-yfgn  8 Units Subcutaneous QHS   linaclotide  145 mcg Oral QAC breakfast   pantoprazole  40 mg Oral Daily   predniSONE  15 mg Oral Q supper   predniSONE  20 mg Oral Q breakfast   senna  2 tablet Oral BID   vitamin B-12  1,000 mcg Oral Daily

## 2022-05-08 NOTE — Progress Notes (Signed)
PROGRESS NOTE    [name]  ONG:[id number] DOB: 09/03/1966 DOA: 04/17/2022 PCP: [name], [name]   Brief Narrative:  56 year old female with history of granulomatosis with polyangiitis, chronic opioid use, HLD, HTN, impaired glucose tolerance, secondary adrenal insufficiency due to chronic prednisone, PE on Eliquis use admitted for altered mental status and weakness.  She was recently admitted prior to this hospitalization for concerns of pneumonia, underwent bronchoscopy which showed Candida and was seen by infectious disease.  Admitted again on 6/26 with acute renal failure and concerns of UTI/pneumonia therefore started on broad-spectrum antibiotics.  Renal function continued to worsen and nephrology team was consulted.  She also had change in mental status and neurology was consulted who performed EEG which was unremarkable and LP was put on hold.  Currently patient is getting hemodialysis nephrology discretion.   Assessment & Plan:  Principal Problem:   Acute metabolic encephalopathy Active Problems:   Acute cystitis without hematuria   Current chronic use of systemic steroids   Chronic pain   Secondary adrenal insufficiency (HCC)   AKI (acute kidney injury) (HCC)   Granulomatosis with polyangiitis (HCC)   GERD (gastroesophageal reflux disease)   Essential hypertension   Obesity (BMI 30-39.9)   COVID-19 virus infection   Severe sepsis (HCC)   Pulmonary embolism (HCC)   Transaminitis   Rhabdomyolysis     AKI/ATN currently requiring HD -Multiple etiology.  Temporary HD catheter placed 7/3.  Patient is now more interactive and alert.  Holding off on HD today, daily determination by nephrology. - Nephrology sent off multiple biomarkers: *Of note C4, and Kappa light chains* were elevated minimally but otherwise ANCA, ANA, RF, anti-dsDNA, c3, spep/sflc without abnormalities, hepatitis panel, HIV, EBV, CMV were unremarkable  Acute metabolic encephalopathy, POA, multifactorial.  Improved.  -Multifactorial from sepsis renal failure and hyponatremia.  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  CNS depressant were discontinued except Cymbalta.  Intrathecal pain pump discontinued.  Per neurology holding off on LP.  CTA head due to slow improvement.  Neurology signed off 7/6.   Acute blood loss anemia on anemia of chronic kidney disease Gluteal hematoma -There was some bleeding around HD catheter site and gluteal hematoma.  Required multiple transfusion but now her hemoglobin remained stable.  No surgical indication, cleared to resume anticoagulation.  EGD in 2022-normal Colonoscopy-multiple diverticula, hemorrhoids with suspicion for fissure. Follows with [facility]    History of pulmonary embolism June 2023 -Lovenox on hold due to recurrent anemia requiring blood transfusion. LE dopplers was negative.  Hemoglobin remained stable on heparin drip.  We will monitor.. Discussed with Gen surgery 7/16 Dr Freida Busman   Polypharmacy risk -Current medications Cymbalta - Home medications included: bupivacaine/zinconotide intrathecal baclofen pump, duloxetine, gabapentin, methocarbamol, nucynta, oxycodone, Topamax.   Chronic pain, neuropathy, and chronic opioid dependence  - Patient has intrathecal pump with bupivacaine and ziconotide - turned down to lowest setting 7/3 (cannot stop completely per tech)    Granulomatosis with polyangiitis  -[facility]s [facility].  Receiving rituximab and methotrexate. - Current medications prednisone 20 mg in the morning and 50 mg in the evening.  Slowly wean off to 5 mg daily.  Requesting outpatient referral for rheumatology upon discharge.  They would like to go to [facility].  Secondary adrenal insufficiency  -Continue steroids    Current chronic use of systemic steroids -At home prednisone 5 mg daily.  Currently on 20/15 mg   Acute hypoxic respiratory failure, POA -Resolved now  on room air    Presumed severe sepsis secondary to pneumonia versus UTI -Completed 5 days of antibiotic therapy   C. difficile colonization -C. difficile toxin is negative.  No indication of treatment at this time   GERD  -PPI    Rhabdomyolysis, resolved -Secondary to prolonged downtime    Elevated transaminitis -Most likely in the setting of sepsis/shock liver and rhabdomyolysis. -Improved    COVID-19 virus infection, questionably incidental -Incidental positive finding at the time of admission.  Remdesivir held due to elevated LFTs.  Completed isolation..   Diabetes mellitus type II, insulin-dependent -A1c 8.6.  Semglee 8 units at bedtime.  Sliding scale and Accu-Cheks.   Obesity  Estimated body mass index is 38.37 kg/m as calculated from the following:   Height as of this encounter: 5\' 5"  (1.651 m).   Weight as of this encounter: 104.6 kg.   PT/OT = SNF TOC aware   DVT prophylaxis: Place and maintain sequential compression device Start: 04/28/22 1251 Code Status: Full code Family Communication: Son at bedside  Status is: Inpatient Remains inpatient appropriate because: Maintain hosp stay until cleared by Nephro. Eventually will go to SNF, TOC aware.    Subjective: Seen and examined at bedside, no complaints this morning.  Examination: Constitutional: Not in acute distress Respiratory: Clear to auscultation bilaterally Cardiovascular: Normal sinus rhythm, no rubs Abdomen: Nontender nondistended good bowel sounds Musculoskeletal: Bilateral 2 amputations Skin: No rashes seen Neurologic: CN 2-12 grossly intact.  And nonfocal Psychiatric: Normal judgment and insight. Alert and oriented x 3.  Flat affect  Objective: Vitals:   05/07/22 2038 05/07/22 2350 05/08/22 0420 05/08/22 0817  BP: (!) 145/84 (!) 149/86 138/86 (!) 150/91  Pulse: 88 84 84 94  Resp: 17 16 15 16   Temp: 98.5 F (36.9 C) 98.6 F (37 C) 98.2 F (36.8 C) 98.5 F (36.9 C)  TempSrc: Oral  Oral   SpO2:  98% 99% 99%   Weight:      Height:        Intake/Output Summary (Last 24 hours) at 05/08/2022 1210 Last data filed at 05/08/2022 0659 Gross per 24 hour  Intake 372.84 ml  Output 1080 ml  Net -707.16 ml   Filed Weights   05/02/22 0421 05/05/22 1233 05/05/22 1654  Weight: 102.1 kg 95.2 kg 95.2 kg     Data Reviewed:   CBC: Recent Labs  Lab 05/02/22 0636 05/02/22 2220 05/03/22 0450 05/04/22 0951 05/05/22 0605 05/07/22 0455 05/08/22 0810  WBC 17.6* 17.5* 17.2* 15.0* 13.1* 12.9* 15.0*  NEUTROABS 15.8* 16.3* 12.5*  --   --   --   --   HGB 8.5* 8.8* 8.0* 7.9* 8.1* 8.3* 8.2*  HCT 24.7* 25.6* 23.5* 23.3* 24.1* 25.0* 25.1*  MCV 82.6 83.9 83.9 84.7 85.5 85.9 87.5  PLT 199 221 194 198 216 217 257   Basic Metabolic Panel: Recent Labs  Lab 05/04/22 0500 05/05/22 0605 05/06/22 0500 05/07/22 0455 05/08/22 0810  NA 141 142 139 141 145  144  K 4.4 4.4 4.2 4.5 4.2  4.2  CL 109 109 107 105 113*  113*  CO2 22 23 26 22 23  22   GLUCOSE 145* 150* 140* 311* 143*  142*  BUN 57* 66* 37* 57* 66*  63*  CREATININE 3.00* 3.02* 1.72* 2.69* 2.63*  2.58*  CALCIUM 7.6* 7.8* 7.8* 8.2* 8.3*  8.2*  MG  --   --   --  1.5* 1.5*  PHOS 4.8* 5.7* 4.2 4.8* 4.1   GFR:  Estimated Creatinine Clearance: 27.8 mL/min (A) (by C-G formula based on SCr of 2.58 mg/dL (H)). Liver Function Tests: Recent Labs  Lab 05/04/22 0500 05/05/22 0605 05/06/22 0500 05/07/22 0455 05/08/22 0810  AST  --   --   --  19 15  ALT  --   --   --  22 21  ALKPHOS  --   --   --  81 73  BILITOT  --   --   --  1.0 1.0  PROT  --   --   --  4.8* 5.1*  ALBUMIN 2.2* 2.3* 2.4* 2.6* 2.8*  2.8*   No results for input(s): "LIPASE", "AMYLASE" in the last 168 hours. No results for input(s): "AMMONIA" in the last 168 hours. Coagulation Profile: No results for input(s): "INR", "PROTIME" in the last 168 hours. Cardiac Enzymes: No results for input(s): "CKTOTAL", "CKMB", "CKMBINDEX", "TROPONINI" in the last 168 hours. BNP  (last 3 results) No results for input(s): "PROBNP" in the last 8760 hours. HbA1C: No results for input(s): "HGBA1C" in the last 72 hours. CBG: Recent Labs  Lab 05/07/22 1137 05/07/22 1532 05/07/22 2130 05/08/22 0635 05/08/22 1116  GLUCAP 231* 261* 256* 166* 155*   Lipid Profile: No results for input(s): "CHOL", "HDL", "LDLCALC", "TRIG", "CHOLHDL", "LDLDIRECT" in the last 72 hours. Thyroid Function Tests: No results for input(s): "TSH", "T4TOTAL", "FREET4", "T3FREE", "THYROIDAB" in the last 72 hours. Anemia Panel: No results for input(s): "VITAMINB12", "FOLATE", "FERRITIN", "TIBC", "IRON", "RETICCTPCT" in the last 72 hours. Sepsis Labs: No results for input(s): "PROCALCITON", "LATICACIDVEN" in the last 168 hours.  No results found for this or any previous visit (from the past 240 hour(s)).       Radiology Studies: No results found.      Scheduled Meds:  amLODipine  10 mg Oral Daily   Chlorhexidine Gluconate Cloth  6 each Topical Daily   diclofenac Sodium  2 g Topical QID   DULoxetine  30 mg Oral Daily   folic acid  2 mg Oral Daily   guaiFENesin-dextromethorphan  10 mL Oral Q12H   insulin aspart  0-9 Units Subcutaneous TID WC   insulin glargine-yfgn  8 Units Subcutaneous QHS   linaclotide  145 mcg Oral QAC breakfast   pantoprazole  40 mg Oral Daily   predniSONE  15 mg Oral Q supper   predniSONE  20 mg Oral Q breakfast   senna  2 tablet Oral BID   vitamin B-12  1,000 mcg Oral Daily   Continuous Infusions:  heparin 1,150 Units/hr (05/08/22 1126)     LOS: 21 days   Time spent= 35 mins    Hamish Banks Joline Maxcy, MD [facility]  If 7PM-7AM, please contact night-coverage  05/08/2022, 12:10 PM

## 2022-05-08 NOTE — Progress Notes (Signed)
ANTICOAGULATION CONSULT NOTE - Follow Up Consult  Pharmacy Consult for Heparin Indication: pulmonary embolus in June 2023 (Eliquis on hold)  Allergies  Allergen Reactions   Ibuprofen Other (See Comments)    Pt is unable to take this due to kidney problems.      Penicillins Rash and Other (See Comments)    Has patient had a PCN reaction causing immediate rash, facial/tongue/throat swelling, SOB or lightheadedness with hypotension: No Has patient had a PCN reaction causing severe rash involving mucus membranes or skin necrosis: No Has patient had a PCN reaction that required hospitalization No Has patient had a PCN reaction occurring within the last 10 years: No If all of the above answers are "NO", then may proceed with Cephalosporin use.    Patient Measurements: Height: 5\' 5"  (165.1 cm) Weight: 95.2 kg (209 lb 14.1 oz) IBW/kg (Calculated) : 57 Heparin Dosing Weight: 79.2 kg  Vital Signs: Temp: 98.1 F (36.7 C) (07/17 1722) Temp Source: Oral (07/17 1722) BP: 149/85 (07/17 1722) Pulse Rate: 94 (07/17 1722)  Labs: Recent Labs    05/06/22 0500 05/07/22 0455 05/07/22 1800 05/08/22 0810 05/08/22 1816 05/08/22 2013  HGB  --  8.3*  --  8.2*  --   --   HCT  --  25.0*  --  25.1*  --   --   PLT  --  217  --  257  --   --   HEPARINUNFRC  --   --    < > 0.96* 1.05* 0.94*  CREATININE 1.72* 2.69*  --  2.63*  2.58*  --   --    < > = values in this interval not displayed.    Estimated Creatinine Clearance: 27.8 mL/min (A) (by C-G formula based on SCr of 2.58 mg/dL (H)).  Assessment: 56 yo F presenting with AMS/sepsis, history of chronic iron deficiency anemia. Patient was on Eliquis PTA for recent PE (03/27/2022). Eliquis was resumed on admission (6/26-7/2) and then discontinued, with transition to Lovenox (7/2-7/6). Last Lovenox dose on 7/6 - held d/t bleeding around patient's HD site and from hemorrhoid/fissure, with need for PRBC transfusion during this admission. Pharmacy now  consulted for heparin dosing.     Heparin level therapeutic (0.44) on 7/16 on 1300 units/hr, then supratherapeutic (0.96) this am. No signs/symptoms of bleeding reported.  Infusion rate decreased to 1150 units/hr > next heparin level 1.05.  RN reported infusion paused per protocol and heparin level drawn via central line.  Repeated with peripheral stick and remains supratherapeutic (0.94).  Goal of Therapy:  Heparin level 0.3-0.7 units/ml Monitor platelets by anticoagulation protocol: Yes   Plan:  Further reduce heparin drip to 950 units/hr Next heparin level and CBC in ~8hrs  8/16, Dennie Fetters 05/08/2022,9:36 PM

## 2022-05-09 DIAGNOSIS — G9341 Metabolic encephalopathy: Secondary | ICD-10-CM | POA: Diagnosis not present

## 2022-05-09 LAB — COMPREHENSIVE METABOLIC PANEL
ALT: 19 U/L (ref 0–44)
AST: 16 U/L (ref 15–41)
Albumin: 2.9 g/dL — ABNORMAL LOW (ref 3.5–5.0)
Alkaline Phosphatase: 88 U/L (ref 38–126)
Anion gap: 5 (ref 5–15)
BUN: 64 mg/dL — ABNORMAL HIGH (ref 6–20)
CO2: 22 mmol/L (ref 22–32)
Calcium: 8.5 mg/dL — ABNORMAL LOW (ref 8.9–10.3)
Chloride: 115 mmol/L — ABNORMAL HIGH (ref 98–111)
Creatinine, Ser: 2.16 mg/dL — ABNORMAL HIGH (ref 0.44–1.00)
GFR, Estimated: 26 mL/min — ABNORMAL LOW (ref >60)
Glucose, Bld: 229 mg/dL — ABNORMAL HIGH (ref 70–99)
Potassium: 4.7 mmol/L (ref 3.5–5.1)
Sodium: 142 mmol/L (ref 135–145)
Total Bilirubin: 1.2 mg/dL (ref 0.3–1.2)
Total Protein: 5 g/dL — ABNORMAL LOW (ref 6.5–8.1)

## 2022-05-09 LAB — GLUCOSE, CAPILLARY
Glucose-Capillary: 232 mg/dL — ABNORMAL HIGH (ref 70–99)
Glucose-Capillary: 211 mg/dL — ABNORMAL HIGH (ref 70–99)
Glucose-Capillary: 240 mg/dL — ABNORMAL HIGH (ref 70–99)
Glucose-Capillary: 243 mg/dL — ABNORMAL HIGH (ref 70–99)
Glucose-Capillary: 236 mg/dL — ABNORMAL HIGH (ref 70–99)

## 2022-05-09 LAB — FUNGUS CULTURE WITH STAIN

## 2022-05-09 LAB — MAGNESIUM: Magnesium: 1.9 mg/dL (ref 1.7–2.4)

## 2022-05-09 LAB — CBC
HCT: 25 % — ABNORMAL LOW (ref 36.0–46.0)
Hemoglobin: 7.9 g/dL — ABNORMAL LOW (ref 12.0–15.0)
MCH: 28.3 pg (ref 26.0–34.0)
MCHC: 31.6 g/dL (ref 30.0–36.0)
MCV: 89.6 fL (ref 80.0–100.0)
Platelets: 278 10*3/uL (ref 150–400)
RBC: 2.79 MIL/uL — ABNORMAL LOW (ref 3.87–5.11)
RDW: 17.2 % — ABNORMAL HIGH (ref 11.5–15.5)
WBC: 16.9 10*3/uL — ABNORMAL HIGH (ref 4.0–10.5)
nRBC: 0 % (ref 0.0–0.2)

## 2022-05-09 LAB — HEPARIN LEVEL (UNFRACTIONATED)
Heparin Unfractionated: 0.84 IU/mL — ABNORMAL HIGH (ref 0.30–0.70)
Heparin Unfractionated: 0.58 IU/mL (ref 0.30–0.70)

## 2022-05-09 LAB — FUNGAL ORGANISM REFLEX

## 2022-05-09 LAB — FUNGUS CULTURE RESULT

## 2022-05-09 MED ORDER — INSULIN ASPART 100 UNIT/ML IJ SOLN
2.0000 [IU] | Freq: Three times a day (TID) | INTRAMUSCULAR | Status: DC
  Administered 2022-05-09 – 2022-05-10 (×3): 2 [IU] via SUBCUTANEOUS

## 2022-05-09 MED ORDER — SODIUM CHLORIDE 0.9% FLUSH: 10.0000 mL | INTRAVENOUS | Status: DC | PRN

## 2022-05-09 MED ORDER — SODIUM CHLORIDE 0.9% FLUSH
10.0000 mL | Freq: Two times a day (BID) | INTRAVENOUS | Status: DC
  Administered 2022-05-09 – 2022-05-18 (×17): 10 mL

## 2022-05-09 MED ORDER — INSULIN GLARGINE-YFGN 100 UNIT/ML ~~LOC~~ SOLN
13.0000 [IU] | Freq: Every day | SUBCUTANEOUS | Status: DC
  Administered 2022-05-09 – 2022-05-10 (×2): 13 [IU] via SUBCUTANEOUS
  Filled 2022-05-09 (×4): qty 0.13

## 2022-05-09 MED ORDER — MORPHINE SULFATE (PF) 2 MG/ML IV SOLN
2.0000 mg | Freq: Once | INTRAVENOUS | Status: DC
  Filled 2022-05-09: qty 1

## 2022-05-09 MED ORDER — MAGNESIUM SULFATE 2 GM/50ML IV SOLN
2.0000 g | Freq: Once | INTRAVENOUS | Status: AC
  Administered 2022-05-09: 2 g via INTRAVENOUS
  Filled 2022-05-09: qty 50

## 2022-05-09 MED ORDER — OXYCODONE HCL 5 MG PO TABS
5.0000 mg | ORAL_TABLET | Freq: Four times a day (QID) | ORAL | Status: AC | PRN
  Administered 2022-05-09 – 2022-05-10 (×2): 5 mg via ORAL
  Filled 2022-05-09 (×3): qty 1

## 2022-05-09 MED ORDER — MORPHINE SULFATE (PF) 2 MG/ML IV SOLN
2.0000 mg | INTRAVENOUS | Status: DC | PRN
  Administered 2022-05-09 – 2022-05-10 (×5): 2 mg via INTRAVENOUS
  Filled 2022-05-09 (×4): qty 1

## 2022-05-09 NOTE — Plan of Care (Signed)

## 2022-05-09 NOTE — Inpatient Diabetes Management (Signed)
Inpatient Diabetes Program Recommendations  AACE/ADA: New Consensus Statement on Inpatient Glycemic Control (2015)  Target Ranges:  Prepandial:   less than 140 mg/dL      Peak postprandial:   less than 180 mg/dL (1-2 hours)      Critically ill patients:  140 - 180 mg/dL   Lab Results  Component Value Date   GLUCAP 211 (H) 05/09/2022   HGBA1C 8.6 (H) 03/26/2022    Review of Glycemic Control  Latest Reference Range & Units 05/08/22 06:35 05/08/22 11:16 05/08/22 17:16 05/08/22 21:57 05/09/22 06:28 05/09/22 07:40  Glucose-Capillary 70 - 99 mg/dL 321 (H) 224 (H) 825 (H) 269 (H) 232 (H) 211 (H)   Diabetes history: DM 2 Outpatient Diabetes medications: Metformin 500 Daily Current orders for Inpatient glycemic control:  Semglee 8 units Daily qhs Novolog 0-9 units tid  Inpatient Diabetes Program Recommendations:    Glucose trends increased due to steroids  -   Consider adding Novolog 2 units meal coverage  Thanks,  Christena Deem RN, MSN, BC-ADM Inpatient Diabetes Coordinator Team Pager [phone] (8a-5p)

## 2022-05-09 NOTE — Progress Notes (Signed)
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  She was recently admitted prior to this hospitalization for concerns of pneumonia, underwent bronchoscopy which showed Candida and was seen by infectious disease.  Admitted again on 6/26 with acute renal failure and concerns of UTI/pneumonia therefore started on broad-spectrum antibiotics.  Renal function continued to worsen and nephrology team was consulted.  She also had change in mental status and neurology was consulted who performed EEG which was unremarkable and LP was put on hold.  Currently patient is getting hemodialysis nephrology discretion.   Assessment & Plan:  Principal Problem:   Acute metabolic encephalopathy Active Problems:   Acute cystitis without hematuria   Current chronic use of systemic steroids   Chronic pain   Secondary adrenal insufficiency (HCC)   AKI (acute kidney injury) (HCC)   Granulomatosis with polyangiitis (HCC)   GERD (gastroesophageal reflux disease)   Essential hypertension   Obesity (BMI 30-39.9)   COVID-19 virus infection   Severe sepsis (HCC)   Pulmonary embolism (HCC)   Transaminitis   Rhabdomyolysis     AKI/ATN currently requiring HD -Multiple etiology.  Temporary HD catheter placed 7/3.  Cr Slowly improving. Holding off on HD for now.  - Nephrology sent off multiple biomarkers: *Of note C4, and Kappa light chains* were elevated minimally but otherwise ANCA, ANA, RF, anti-dsDNA, c3, spep/sflc without abnormalities, hepatitis panel, HIV, EBV, CMV were unremarkable  Acute metabolic encephalopathy, POA, multifactorial. Improved.  -Multifactorial from sepsis renal failure  and hyponatremia.  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  CNS depressant were discontinued except Cymbalta.  Intrathecal pain pump discontinued.  Per neurology holding off on LP.  CTA head due to slow improvement.  Neurology signed off 7/6.   Acute blood loss anemia on anemia of chronic kidney disease Gluteal hematoma -Hb is now stable. There was some bleeding around HD catheter site and gluteal hematoma.  Required multiple transfusion but now her hemoglobin remained stable.  No surgical indication, cleared to resume anticoagulation.  EGD in 2022-normal Colonoscopy-multiple diverticula, hemorrhoids with suspicion for fissure. Follows with [facility] heme-onc    History of pulmonary embolism June 2023 -Lovenox on hold due to recurrent anemia requiring blood transfusion. LE dopplers was negative.  Hemoglobin remained stable on heparin drip.  We will monitor.. Discussed with Gen surgery 7/16 Dr Freida Busman   Polypharmacy risk -Current medications Cymbalta - Home medications included: bupivacaine/zinconotide intrathecal baclofen pump, duloxetine, gabapentin, methocarbamol, nucynta, oxycodone, Topamax.   Chronic pain, neuropathy, and chronic opioid dependence  Right lower Ext and Low back pain - Patient has intrathecal pump with bupivacaine and ziconotide - turned down to lowest setting 7/3 (cannot stop completely per tech) MRI Lumbar spine ordered.     Granulomatosis with polyangiitis  -Follows outpatient now on [facility].  Receiving rituximab and methotrexate. - Current medications prednisone 20 mg in the morning and 50 mg in the evening.  Slowly wean off to 5 mg daily.  Requesting outpatient referral for rheumatology upon discharge.  They would like to go to [facility].  Secondary adrenal insufficiency  -Continue steroids    Current chronic use of systemic steroids -At home prednisone 5 mg daily.  Currently on 20/15 mg  Acute hypoxic respiratory failure,  POA -Resolved now on room air   Presumed severe sepsis secondary to pneumonia versus UTI -Completed 5 days of antibiotic therapy    GERD  -PPI    COVID-19 virus infection, questionably incidental -Incidental positive finding at the time of admission.  Remdesivir held due to elevated LFTs.  Completed isolation..   Diabetes mellitus type II, insulin-dependent with hyperglycemia -A1c 8.6. Increase Semglee to 13U qhs.  Sliding scale and Accu-Cheks.   Obesity  Estimated body mass index is 38.37 kg/m as calculated from the following:   Height as of this encounter: 5\' 5"  (1.651 m).   Weight as of this encounter: 104.6 kg.   PT/OT = SNF TOC aware   DVT prophylaxis: Place and maintain sequential compression device Start: 04/28/22 1251 Code Status: Full code Family Communication: Son at bedside  Status is: Inpatient Remains inpatient appropriate because: Maintain hosp stay until cleared by Nephro. Eventually will go to SNF, TOC aware.    Subjective: Complains of severe right Ext pain radiating down from her lower back.  No other complaints.   Examination: Constitutional: Not in acute distress Respiratory: Clear to auscultation bilaterally Cardiovascular: Normal sinus rhythm, no rubs Abdomen: Nontender nondistended good bowel sounds Musculoskeletal: b/l toe amputations.  Skin: No rashes seen Neurologic: CN 2-12 grossly intact.  And nonfocal Psychiatric: Normal judgment and insight. Alert and oriented x 3. Normal mood.    Objective: Vitals:   05/09/22 0050 05/09/22 0326 05/09/22 0741 05/09/22 1201  BP: (!) 145/93 (!) 149/82 (!) 155/95 (!) 146/89  Pulse: 81 (!) 45 99 95  Resp: 17 14 16 15   Temp: 97.9 F (36.6 C) 98 F (36.7 C) 97.8 F (36.6 C) 98.2 F (36.8 C)  TempSrc: Oral Oral Oral Oral  SpO2: 100% 100%  100%  Weight:  96.1 kg    Height:        Intake/Output Summary (Last 24 hours) at 05/09/2022 1223 Last data filed at 05/09/2022 0800 Gross per 24 hour  Intake  488.59 ml  Output 1800 ml  Net -1311.41 ml   Filed Weights   05/05/22 1233 05/05/22 1654 05/09/22 0326  Weight: 95.2 kg 95.2 kg 96.1 kg     Data Reviewed:   CBC: Recent Labs  Lab 05/02/22 2220 05/03/22 0450 05/04/22 0951 05/05/22 0605 05/07/22 0455 05/08/22 0810 05/09/22 0625  WBC 17.5* 17.2* 15.0* 13.1* 12.9* 15.0* 16.9*  NEUTROABS 16.3* 12.5*  --   --   --   --   --   HGB 8.8* 8.0* 7.9* 8.1* 8.3* 8.2* 7.9*  HCT 25.6* 23.5* 23.3* 24.1* 25.0* 25.1* 25.0*  MCV 83.9 83.9 84.7 85.5 85.9 87.5 89.6  PLT 221 194 198 216 217 257 278   Basic Metabolic Panel: Recent Labs  Lab 05/04/22 0500 05/05/22 0605 05/06/22 0500 05/07/22 0455 05/08/22 0810 05/09/22 0625  NA 141 142 139 141 145  144 142  K 4.4 4.4 4.2 4.5 4.2  4.2 4.7  CL 109 109 107 105 113*  113* 115*  CO2 22 23 26 22 23  22 22   GLUCOSE 145* 150* 140* 311* 143*  142* 229*  BUN 57* 66* 37* 57* 66*  63* 64*  CREATININE 3.00* 3.02* 1.72* 2.69* 2.63*  2.58* 2.16*  CALCIUM 7.6* 7.8* 7.8* 8.2* 8.3*  8.2* 8.5*  MG  --   --   --  1.5* 1.5* 1.9  PHOS 4.8* 5.7* 4.2 4.8* 4.1  --    GFR: Estimated Creatinine Clearance: 33.3 mL/min (  A) (by C-G formula based on SCr of 2.16 mg/dL (H)). Liver Function Tests: Recent Labs  Lab 05/05/22 0605 05/06/22 0500 05/07/22 0455 05/08/22 0810 05/09/22 0625  AST  --   --  19 15 16   ALT  --   --  22 21 19   ALKPHOS  --   --  81 73 88  BILITOT  --   --  1.0 1.0 1.2  PROT  --   --  4.8* 5.1* 5.0*  ALBUMIN 2.3* 2.4* 2.6* 2.8*  2.8* 2.9*   No results for input(s): "LIPASE", "AMYLASE" in the last 168 hours. No results for input(s): "AMMONIA" in the last 168 hours. Coagulation Profile: No results for input(s): "INR", "PROTIME" in the last 168 hours. Cardiac Enzymes: No results for input(s): "CKTOTAL", "CKMB", "CKMBINDEX", "TROPONINI" in the last 168 hours. BNP (last 3 results) No results for input(s): "PROBNP" in the last 8760 hours. HbA1C: No results for input(s): "HGBA1C"  in the last 72 hours. CBG: Recent Labs  Lab 05/08/22 1716 05/08/22 2157 05/09/22 0628 05/09/22 0740 05/09/22 1205  GLUCAP 202* 269* 232* 211* 240*   Lipid Profile: No results for input(s): "CHOL", "HDL", "LDLCALC", "TRIG", "CHOLHDL", "LDLDIRECT" in the last 72 hours. Thyroid Function Tests: No results for input(s): "TSH", "T4TOTAL", "FREET4", "T3FREE", "THYROIDAB" in the last 72 hours. Anemia Panel: No results for input(s): "VITAMINB12", "FOLATE", "FERRITIN", "TIBC", "IRON", "RETICCTPCT" in the last 72 hours. Sepsis Labs: No results for input(s): "PROCALCITON", "LATICACIDVEN" in the last 168 hours.  No results found for this or any previous visit (from the past 240 hour(s)).       Radiology Studies: No results found.      Scheduled Meds:  amLODipine  10 mg Oral Daily   Chlorhexidine Gluconate Cloth  6 each Topical Daily   diclofenac Sodium  2 g Topical QID   DULoxetine  30 mg Oral Daily   folic acid  2 mg Oral Daily   guaiFENesin-dextromethorphan  10 mL Oral Q12H   insulin aspart  0-9 Units Subcutaneous TID WC   insulin glargine-yfgn  8 Units Subcutaneous QHS   linaclotide  145 mcg Oral QAC breakfast   pantoprazole  40 mg Oral Daily   predniSONE  15 mg Oral Q supper   predniSONE  20 mg Oral Q breakfast   senna  2 tablet Oral BID   sodium chloride flush  10-40 mL Intracatheter Q12H   vitamin B-12  1,000 mcg Oral Daily   Continuous Infusions:  heparin 800 Units/hr (05/09/22 0733)     LOS: 22 days   Time spent= 35 mins    Drayson Dorko 05/11/22, MD [facility]  If 7PM-7AM, please contact night-coverage  05/09/2022, 12:23 PM

## 2022-05-09 NOTE — Progress Notes (Addendum)
Occupational Therapy Treatment Patient Details Name: Syliva Mee MRN: [id number] DOB: [birth year] Today's Date: 05/09/2022   History of present illness Pt is a 56 y.o. female admitted 6/26 after being found by her husband on the floor at home with AMS. Admitted with dx of acute metabolic encephalopathy. Incidental COVID+ on admission. Initially admitted to [facility] and transferred to [facility] 7/1 for initiation of HD. Multiple recent hospitalizations, 4/3-5/4 and 6/4-6/14. PMH: PE, Wegener's disease, CKD 3, DM, HTN, chronic pain with an intrathecal pump, OSA, bilat mid-foot amputations, peripheral neuropathy   OT comments  Pt with slow progression toward goals, limited by RLE pain this session. Pt able to come up into long sitting with RLE dangling off of bed with supervision, unable to pivot due to pain, declining further mobility. Pt min A for rolling R/L for pad change. Completes grooming task, UB dressing, and pericare at bed level with set up -minA. Pt presenting with impairments listed below, will follow acutely. Continue to recommend SNF at d/c.   Recommendations for follow up therapy are one component of a multi-disciplinary discharge planning process, led by the attending physician.  Recommendations may be updated based on patient status, additional functional criteria and insurance authorization.    Follow Up Recommendations  Skilled nursing-short term rehab (<3 hours/day)    Assistance Recommended at Discharge Frequent or constant Supervision/Assistance  Patient can return home with the following  A lot of help with walking and/or transfers;A lot of help with bathing/dressing/bathroom;Assistance with cooking/housework;Assistance with feeding;Direct supervision/assist for medications management;Direct supervision/assist for financial management;Assist for transportation;Help with stairs or ramp for entrance   Equipment Recommendations  None recommended by OT;Other (comment) (defer to next  venue of care)    Recommendations for Other Services PT consult    Precautions / Restrictions Precautions Precautions: Fall Precaution Comments: h/o bilateral transmet amputations, central line Restrictions Weight Bearing Restrictions: No       Mobility Bed Mobility Overal bed mobility: Needs Assistance Bed Mobility: Rolling Rolling: Min assist   Supine to sit: Supervision     General bed mobility comments: able to sit up in bed without support, reports unable to pivot due to RLE pain    Transfers                   General transfer comment: pt declining due to pain     Balance Overall balance assessment: Needs assistance Sitting-balance support: No upper extremity supported Sitting balance-Leahy Scale: Good                                     ADL either performed or assessed with clinical judgement   ADL Overall ADL's : Needs assistance/impaired     Grooming: Set up;Bed level Grooming Details (indicate cue type and reason): to brush teeth         Upper Body Dressing : Minimal assistance;Bed level Upper Body Dressing Details (indicate cue type and reason): to don clean gown         Toileting- Clothing Manipulation and Hygiene: Supervision/safety;Bed level Toileting - Clothing Manipulation Details (indicate cue type and reason): pericare at bed level     Functional mobility during ADLs: Min guard      Extremity/Trunk Assessment Upper Extremity Assessment Upper Extremity Assessment: Generalized weakness   Lower Extremity Assessment Lower Extremity Assessment: Defer to PT evaluation        Vision   Vision Assessment?: No  apparent visual deficits   Perception Perception Perception: Within Functional Limits   Praxis Praxis Praxis: Intact    Cognition Arousal/Alertness: Awake/alert Behavior During Therapy: Flat affect Overall Cognitive Status: Difficult to assess                                 General  Comments: Following commands, some delayed proccessing noted. Oriented.        Exercises      Shoulder Instructions       General Comments VSS on RA    Pertinent Vitals/ Pain       Pain Assessment Pain Assessment: Faces Pain Score: 9  Faces Pain Scale: Hurts whole lot Pain Location: R hip Pain Descriptors / Indicators: Discomfort, Grimacing Pain Intervention(s): Limited activity within patient's tolerance, Monitored during session, Repositioned, Ice applied  Home Living                                          Prior Functioning/Environment              Frequency  Min 2X/week        Progress Toward Goals  OT Goals(current goals can now be found in the care plan section)  Progress towards OT goals: Progressing toward goals  Acute Rehab OT Goals Patient Stated Goal: decrease pain OT Goal Formulation: With patient Time For Goal Achievement: 05/14/22 Potential to Achieve Goals: Good ADL Goals Pt Will Perform Grooming: with set-up;sitting Pt Will Perform Lower Body Bathing: with min assist;sitting/lateral leans;sit to/from stand Pt Will Perform Lower Body Dressing: with min assist;sitting/lateral leans;sit to/from stand Pt Will Transfer to Toilet: with mod assist;ambulating Pt Will Perform Toileting - Clothing Manipulation and hygiene: with min assist;sitting/lateral leans;sit to/from stand  Plan Discharge plan remains appropriate;Frequency remains appropriate    Co-evaluation                 AM-PAC OT "6 Clicks" Daily Activity     Outcome Measure   Help from another person eating meals?: A Little Help from another person taking care of personal grooming?: A Little Help from another person toileting, which includes using toliet, bedpan, or urinal?: A Lot Help from another person bathing (including washing, rinsing, drying)?: A Lot Help from another person to put on and taking off regular upper body clothing?: A Little Help from  another person to put on and taking off regular lower body clothing?: A Lot 6 Click Score: 15    End of Session    OT Visit Diagnosis: Unsteadiness on feet (R26.81);Other abnormalities of gait and mobility (R26.89);Muscle weakness (generalized) (M62.81)   Activity Tolerance Patient tolerated treatment well   Patient Left in bed;with call bell/phone within reach;with bed alarm set   Nurse Communication Mobility status        Time: 4008-6761 OT Time Calculation (min): 28 min  Charges: OT General Charges $OT Visit: 1 Visit OT Treatments $Self Care/Home Management : 23-37 mins  Alfonzo Beers, OTD, OTR/L Acute Rehab [phone]) [phone]   Mayer Masker 05/09/2022, 11:46 AM

## 2022-05-09 NOTE — Progress Notes (Signed)
Patient ID: [name], female   DOB: 16-Sep-1966, 56 y.o.   MRN: [id number] [facility] Progress Note   Assessment/ Plan:   1. Acute kidney Injury: Normal renal function at baseline now with AKI in setting of UTI.  Etiology though to be hemodynamically mediated in setting of UTI and NOT suspected to be related to her history of ANCA associated GPA based on recent rituximab administration in May and urinalysis not suggestive of GN but rather UTI.  Repeated UA 7/11 - looks consistent with foley specimen not GN.  Weighing risk:benefit of renal biopsy that I think would be low yield I recommend against a renal biopsy at this time.   She had HD x 2 in past week for possible uremia (dec alertness) with BUN in the 60s - last on 7/14.  Seems sensitive to BUN elevations even below 100.  Will continue to do daily assessment for ongoing dialytic needs. Fortunately, her Cr is better and she remains non-oliguric. Will hold off on HD today. Maintain HD cath for now especially as it is being used for IV access.  -Strict I/O, maintain purewick. Cont to check PVR's and can do in and out catheterization if needed  2.  Anemia: Previously with some bleeding around her dialysis catheter and some transient hematochezia.  Status post PRBC transfusions, CT 7/10 with gluteal hematoma for which surgery was consulted and is observing for now.  Hb ~8s, transfuse < 7.  3.  Altered mental status: Suspected to be multifactorial etiology with lumbar puncture deferred after improvement of mental status with dialysis and reduction of baclofen rate.  MS again worse and had HD 7/11, felt improved; same situation 7/14 - did dialysis and they feel improved.   Robaxin and balofen definitely made things worse and these have been d/c'd  -watch for now, no indications for HD today 4.  Sepsis: Suspected to be of pneumonia/urinary tract infection etiology and status post completion of antibiotics. 5.  Granulomatosis with  polyangiitis: On prednisone dose decreased now to 35 mg daily at this time with previous rituximab administered in May.  Dx 15y ago = on chronic methotrexate/rituximab and pred ( sees Dr. Talbert Cage with rheumatology). Per above doubt renal GPA and no plans for renal biopsy at this time.  6.  COVID-19 virus infection: Incidental positive COVID PCR found at admission.  Remains on corticosteroids and did not get remdesivir due to elevated LFTs. 7.  Adrenal insufficiency 8  H/O PE: 03/2022 on CTA; had been on apixiban but held due to anemia requiring repeated transfusion - currently on heparin gtt-per primary  Will continue to follow.  Needs PT/OT eval moving towards dispo.  Will continue to follow.  Subjective:   No acute events. Uop 1.8L Reports RLE pain, unable to move leg. Per RN, primary service already notified, receiving pain medications Discussed w/ son at bedside HD catheter in use for access (pigtail in use for hep gtt)   Objective:   BP (!) 155/95 (BP Location: Right Arm)   Pulse 99   Temp 97.8 F (36.6 C) (Oral)   Resp 16   Ht 5\' 5"  (1.651 m)   Wt 96.1 kg   SpO2 100%   BMI 35.26 kg/m   Intake/Output Summary (Last 24 hours) at 05/09/2022 0938 Last data filed at 05/09/2022 0340 Gross per 24 hour  Intake 248.59 ml  Output 1800 ml  Net -1551.41 ml   Weight change:   Physical Exam: Gen: NAD CVS: Pulse regular rhythm, normal  rate, S1 and S2 normal Resp: Transmitted breath sounds bilaterally without rales/rhonchi Abd: Soft, obese, nontender, bowel sounds normal Ext: Trace lower extremity edema.  Bilateral forefoot amputations noted Neuro: awake, alert Dialysis access: RIJ temp line c/d/i  Imaging: No results found.  Labs: BMET Recent Labs  Lab 05/03/22 0450 05/04/22 0500 05/05/22 0605 05/06/22 0500 05/07/22 0455 05/08/22 0810 05/09/22 0625  NA 137 141 142 139 141 145  144 142  K 4.1 4.4 4.4 4.2 4.5 4.2  4.2 4.7  CL 104 109 109 107 105 113*  113* 115*  CO2  25 22 23 26 22 23  22 22   GLUCOSE 132* 145* 150* 140* 311* 143*  142* 229*  BUN 47* 57* 66* 37* 57* 66*  63* 64*  CREATININE 2.81* 3.00* 3.02* 1.72* 2.69* 2.63*  2.58* 2.16*  CALCIUM 7.4* 7.6* 7.8* 7.8* 8.2* 8.3*  8.2* 8.5*  PHOS 4.4 4.8* 5.7* 4.2 4.8* 4.1  --    CBC Recent Labs  Lab 05/02/22 2220 05/03/22 0450 05/04/22 0951 05/05/22 0605 05/07/22 0455 05/08/22 0810 05/09/22 0625  WBC 17.5* 17.2*   < > 13.1* 12.9* 15.0* 16.9*  NEUTROABS 16.3* 12.5*  --   --   --   --   --   HGB 8.8* 8.0*   < > 8.1* 8.3* 8.2* 7.9*  HCT 25.6* 23.5*   < > 24.1* 25.0* 25.1* 25.0*  MCV 83.9 83.9   < > 85.5 85.9 87.5 89.6  PLT 221 194   < > 216 217 257 278   < > = values in this interval not displayed.    Medications:     amLODipine  10 mg Oral Daily   Chlorhexidine Gluconate Cloth  6 each Topical Daily   diclofenac Sodium  2 g Topical QID   DULoxetine  30 mg Oral Daily   folic acid  2 mg Oral Daily   guaiFENesin-dextromethorphan  10 mL Oral Q12H   insulin aspart  0-9 Units Subcutaneous TID WC   insulin glargine-yfgn  8 Units Subcutaneous QHS   linaclotide  145 mcg Oral QAC breakfast   pantoprazole  40 mg Oral Daily   predniSONE  15 mg Oral Q supper   predniSONE  20 mg Oral Q breakfast   senna  2 tablet Oral BID   sodium chloride flush  10-40 mL Intracatheter Q12H   vitamin B-12  1,000 mcg Oral Daily

## 2022-05-09 NOTE — Progress Notes (Signed)
Speech Language Pathology Treatment: Dysphagia  Patient Details Name: [name] MRN: [id number] DOB: Aug 29, 1966 Today's Date: 05/09/2022 Time: 3159-4585 SLP Time Calculation (min) (ACUTE ONLY): 10 min  Assessment / Plan / Recommendation Clinical Impression  Pt seen at bedside for skilled ST intervention targeting goals for diet tolerance and adherence to safe swallow precautions. Pt was eating lunch upon arrival of SLP. RN reports no difficulty swallowing solids or liquids, and no difficulty with PO meds.  Pt was observed eating a [employer] salad and thin liquids. Pt able to self feed, and did not exhibit obvious oral issues or overt s/s aspiration. Pt prefers the Dys3 diet as it is cut up for her. This is beneficial to maximize energy conservation and minimize aspiration risk. Recommend continuing with current diet (RN reports family brings in regular consistency food from outside, and pt tolerates this well). ST will sign off at this time. Please reconsult if needs arise.   HPI HPI: 56 y.o. female with medical history significant of Wegener's angiitis, chronic opioid dependency, hyperlipidemia, hypertension, impaired glucose tolerance, adrenal insufficiency on chronic prednisone usage, rheumatoid arthritis and multiple admissions secondary to sepsis in the setting of pneumonia and UTI.  Presented to the hospital [facility]) on 04/17/22 with complaints of intermittent altered mental status changes at home and increased weakness.  Symptoms have been present for the last 2-3 days and worsening according to patient's husband.  Patient has just completed her antibiotic therapy from most recent admission about 1 week prior to presentation.  In the ED patient met criteria for severe sepsis with elevated WBCs, elevated lactic acid in the 4.7 range, acute renal failure as organ dysfunction and was also tachycardic and tachypneic;Subsequently found to be Covid+; transferred to [facility] on 04/22/22; ST  evaluation on 04/21/22 indicating need for Dysphagia 2/honey-thickened liquids: CXR on 04/17/22 indicated Progressive interstitial pattern left lung and right upper lobe.  This may represent edema or infection; ST completed MBS on 04/28/22 with recommendation for Dysphagia 3/nectar-thickened liquids.  ST f/u for diet tolerance.      SLP Plan  Discharge SLP treatment due to goals met      Recommendations for follow up therapy are one component of a multi-disciplinary discharge planning process, led by the attending physician.  Recommendations may be updated based on patient status, additional functional criteria and insurance authorization.    Recommendations  Diet recommendations: Dysphagia 3 (mechanical soft);Thin liquid Liquids provided via: Cup;Straw Medication Administration: Whole meds with liquid Supervision: Patient able to self feed;Intermittent supervision to cue for compensatory strategies Compensations: Slow rate;Small sips/bites Postural Changes and/or Swallow Maneuvers: Seated upright 90 degrees                Oral Care Recommendations: Oral care BID;Patient independent with oral care Follow Up Recommendations: Follow physician's recommendations for discharge plan and follow up therapies Assistance recommended at discharge: Intermittent Supervision/Assistance SLP Visit Diagnosis: Dysphagia, oropharyngeal phase (R13.12) Plan: Discharge SLP treatment due to goals met          Aryonna Gunnerson B. Quentin Ore, [facility], [place] Speech Language Pathologist Office: [phone]  Shonna Chock 05/09/2022, 1:33 PM

## 2022-05-09 NOTE — Progress Notes (Signed)
ANTICOAGULATION CONSULT NOTE - Follow Up Consult  Pharmacy Consult for Heparin Indication: pulmonary embolus in June 2023 (Eliquis on hold)  Allergies  Allergen Reactions   Ibuprofen Other (See Comments)    Pt is unable to take this due to kidney problems.      Penicillins Rash and Other (See Comments)    Has patient had a PCN reaction causing immediate rash, facial/tongue/throat swelling, SOB or lightheadedness with hypotension: No Has patient had a PCN reaction causing severe rash involving mucus membranes or skin necrosis: No Has patient had a PCN reaction that required hospitalization No Has patient had a PCN reaction occurring within the last 10 years: No If all of the above answers are "NO", then may proceed with Cephalosporin use.    Patient Measurements: Height: 5\' 5"  (165.1 cm) Weight: 96.1 kg (211 lb 13.8 oz) IBW/kg (Calculated) : 57 Heparin Dosing Weight: 79.2 kg  Vital Signs: Temp: 98.4 F (36.9 C) (07/18 1514) Temp Source: Oral (07/18 1514) BP: 148/96 (07/18 1514) Pulse Rate: 95 (07/18 1514)  Labs: Recent Labs    05/07/22 0455 05/07/22 1800 05/08/22 0810 05/08/22 1816 05/08/22 2013 05/09/22 0625 05/09/22 1330  HGB 8.3*  --  8.2*  --   --  7.9*  --   HCT 25.0*  --  25.1*  --   --  25.0*  --   PLT 217  --  257  --   --  278  --   HEPARINUNFRC  --    < > 0.96*   < > 0.94* 0.84* 0.58  CREATININE 2.69*  --  2.63*  2.58*  --   --  2.16*  --    < > = values in this interval not displayed.     Estimated Creatinine Clearance: 33.3 mL/min (A) (by C-G formula based on SCr of 2.16 mg/dL (H)).  Assessment: 56 yo F presenting with AMS/sepsis, history of chronic iron deficiency anemia. Patient was on Eliquis PTA for recent PE (03/27/2022). Eliquis was resumed on admission (6/26-7/2) and then discontinued, with transition to Lovenox (7/2-7/6). Last Lovenox dose on 7/6 - held d/t bleeding around patient's HD site and from hemorrhoid/fissure, with need for PRBC  transfusion during this admission. Pharmacy now consulted for heparin dosing.     Heparin level therapeutic (0.44) on 7/16 on 1300 units/hr, then supratherapeutic (0.96) this am. No signs/symptoms of bleeding reported.  Infusion rate decreased to 1150 units/hr > next heparin level 1.05.  RN reported infusion paused per protocol and heparin level drawn via central line.  Repeated with peripheral stick and remains supratherapeutic (0.94).  7/18 1547 update:  Heparin level therapeutic at 0.58  Goal of Therapy:  Heparin level 0.3-0.7 units/ml Monitor platelets by anticoagulation protocol: Yes   Plan:  Continue heparin to 800 units/hr Check daily Heparin levels and CBC Monitor for signs of bleeding  8/18 BS, PharmD, BCPS Clinical Pharmacist 05/09/2022 3:47 PM  Contact: [phone] after 3 PM  "Be curious, not judgmental..." [phone]

## 2022-05-09 NOTE — Progress Notes (Signed)
Patient complains of severe right leg/hip pain. MD Nelson Chimes made aware. Orders received and initiated.

## 2022-05-09 NOTE — Progress Notes (Signed)
ANTICOAGULATION CONSULT NOTE - Follow Up Consult  Pharmacy Consult for Heparin Indication: pulmonary embolus in June 2023 (Eliquis on hold)  Allergies  Allergen Reactions   Ibuprofen Other (See Comments)    Pt is unable to take this due to kidney problems.      Penicillins Rash and Other (See Comments)    Has patient had a PCN reaction causing immediate rash, facial/tongue/throat swelling, SOB or lightheadedness with hypotension: No Has patient had a PCN reaction causing severe rash involving mucus membranes or skin necrosis: No Has patient had a PCN reaction that required hospitalization No Has patient had a PCN reaction occurring within the last 10 years: No If all of the above answers are "NO", then may proceed with Cephalosporin use.    Patient Measurements: Height: 5\' 5"  (165.1 cm) Weight: 96.1 kg (211 lb 13.8 oz) IBW/kg (Calculated) : 57 Heparin Dosing Weight: 79.2 kg  Vital Signs: Temp: 98 F (36.7 C) (07/18 0326) Temp Source: Oral (07/18 0326) BP: 149/82 (07/18 0326) Pulse Rate: 45 (07/18 0326)  Labs: Recent Labs    05/07/22 0455 05/07/22 1800 05/08/22 0810 05/08/22 1816 05/08/22 2013 05/09/22 0625  HGB 8.3*  --  8.2*  --   --  7.9*  HCT 25.0*  --  25.1*  --   --  25.0*  PLT 217  --  257  --   --  278  HEPARINUNFRC  --    < > 0.96* 1.05* 0.94* 0.84*  CREATININE 2.69*  --  2.63*  2.58*  --   --   --    < > = values in this interval not displayed.     Estimated Creatinine Clearance: 27.9 mL/min (A) (by C-G formula based on SCr of 2.58 mg/dL (H)).  Assessment: 56 yo F presenting with AMS/sepsis, history of chronic iron deficiency anemia. Patient was on Eliquis PTA for recent PE (03/27/2022). Eliquis was resumed on admission (6/26-7/2) and then discontinued, with transition to Lovenox (7/2-7/6). Last Lovenox dose on 7/6 - held d/t bleeding around patient's HD site and from hemorrhoid/fissure, with need for PRBC transfusion during this admission. Pharmacy now  consulted for heparin dosing.     Heparin level therapeutic (0.44) on 7/16 on 1300 units/hr, then supratherapeutic (0.96) this am. No signs/symptoms of bleeding reported.  Infusion rate decreased to 1150 units/hr > next heparin level 1.05.  RN reported infusion paused per protocol and heparin level drawn via central line.  Repeated with peripheral stick and remains supratherapeutic (0.94).  7/18 AM update:  Heparin level high but trending down  Goal of Therapy:  Heparin level 0.3-0.7 units/ml Monitor platelets by anticoagulation protocol: Yes   Plan:  Dec heparin to 800 units/hr 1500 heparin level  8/18, PharmD, BCPS Clinical Pharmacist Phone: [phone]

## 2022-05-10 ENCOUNTER — Inpatient Hospital Stay (HOSPITAL_COMMUNITY): Payer: BC Managed Care – PPO

## 2022-05-10 DIAGNOSIS — G9341 Metabolic encephalopathy: Secondary | ICD-10-CM | POA: Diagnosis not present

## 2022-05-10 DIAGNOSIS — I2699 Other pulmonary embolism without acute cor pulmonale: Secondary | ICD-10-CM

## 2022-05-10 LAB — RENAL FUNCTION PANEL
Albumin: 2.9 g/dL — ABNORMAL LOW (ref 3.5–5.0)
Anion gap: 7 (ref 5–15)
BUN: 64 mg/dL — ABNORMAL HIGH (ref 6–20)
CO2: 21 mmol/L — ABNORMAL LOW (ref 22–32)
Calcium: 8.8 mg/dL — ABNORMAL LOW (ref 8.9–10.3)
Chloride: 113 mmol/L — ABNORMAL HIGH (ref 98–111)
Creatinine, Ser: 1.86 mg/dL — ABNORMAL HIGH (ref 0.44–1.00)
GFR, Estimated: 31 mL/min — ABNORMAL LOW (ref >60)
Glucose, Bld: 143 mg/dL — ABNORMAL HIGH (ref 70–99)
Phosphorus: 5 mg/dL — ABNORMAL HIGH (ref 2.5–4.6)
Potassium: 4.3 mmol/L (ref 3.5–5.1)
Sodium: 141 mmol/L (ref 135–145)

## 2022-05-10 LAB — HEMOGLOBIN AND HEMATOCRIT, BLOOD
HCT: 22.3 % — ABNORMAL LOW (ref 36.0–46.0)
Hemoglobin: 7.3 g/dL — ABNORMAL LOW (ref 12.0–15.0)

## 2022-05-10 LAB — CBC
HCT: 20.5 % — ABNORMAL LOW (ref 36.0–46.0)
Hemoglobin: 6.8 g/dL — CL (ref 12.0–15.0)
MCH: 29.8 pg (ref 26.0–34.0)
MCHC: 33.2 g/dL (ref 30.0–36.0)
MCV: 89.9 fL (ref 80.0–100.0)
Platelets: 278 10*3/uL (ref 150–400)
RBC: 2.28 MIL/uL — ABNORMAL LOW (ref 3.87–5.11)
RDW: 17.6 % — ABNORMAL HIGH (ref 11.5–15.5)
WBC: 17.2 10*3/uL — ABNORMAL HIGH (ref 4.0–10.5)
nRBC: 0 % (ref 0.0–0.2)

## 2022-05-10 LAB — PREPARE RBC (CROSSMATCH)

## 2022-05-10 LAB — GLUCOSE, CAPILLARY
Glucose-Capillary: 148 mg/dL — ABNORMAL HIGH (ref 70–99)
Glucose-Capillary: 146 mg/dL — ABNORMAL HIGH (ref 70–99)
Glucose-Capillary: 134 mg/dL — ABNORMAL HIGH (ref 70–99)
Glucose-Capillary: 352 mg/dL — ABNORMAL HIGH (ref 70–99)

## 2022-05-10 LAB — HEPARIN LEVEL (UNFRACTIONATED): Heparin Unfractionated: >1.1 IU/mL — ABNORMAL HIGH (ref 0.30–0.70)

## 2022-05-10 LAB — MAGNESIUM: Magnesium: 1.6 mg/dL — ABNORMAL LOW (ref 1.7–2.4)

## 2022-05-10 MED ORDER — MORPHINE SULFATE (PF) 2 MG/ML IV SOLN
2.0000 mg | INTRAVENOUS | Status: DC | PRN
  Administered 2022-05-10 – 2022-05-15 (×6): 2 mg via INTRAVENOUS
  Filled 2022-05-10 (×6): qty 1

## 2022-05-10 MED ORDER — MAGNESIUM SULFATE 2 GM/50ML IV SOLN
2.0000 g | Freq: Once | INTRAVENOUS | Status: AC
  Administered 2022-05-10: 2 g via INTRAVENOUS
  Filled 2022-05-10: qty 50

## 2022-05-10 MED ORDER — OXYCODONE HCL 5 MG PO TABS
2.5000 mg | ORAL_TABLET | Freq: Once | ORAL | Status: AC
  Administered 2022-05-10: 2.5 mg via ORAL
  Filled 2022-05-10: qty 1

## 2022-05-10 MED ORDER — SODIUM CHLORIDE 0.9% IV SOLUTION: Freq: Once | INTRAVENOUS | Status: AC

## 2022-05-10 NOTE — Progress Notes (Signed)
Spoke with nurse Dwana Curd RN. Instructed to put an IV consult in for IV team when ready for HD catheter to be removed. VU. Tomasita Morrow, RN VAST

## 2022-05-10 NOTE — Progress Notes (Signed)
Patient ID: [name], female   DOB: 31-Jan-1966, 56 y.o.   MRN: [id number] [facility] Progress Note   Assessment/ Plan:   1. Acute kidney Injury: Normal renal function at baseline now with AKI in setting of UTI.  Etiology though to be hemodynamically mediated in setting of UTI and NOT suspected to be related to her history of ANCA associated GPA based on recent rituximab administration in May and urinalysis not suggestive of GN but rather UTI.  Repeated UA 7/11 - looks consistent with foley specimen not GN.  Weighing risk:benefit of renal biopsy that I think would be low yield I recommend against a renal biopsy at this time.   -She had HD x 2 in past week for possible uremia (dec alertness) with BUN in the 60s - last on 7/14.  Seems sensitive to BUN elevations even below 100.  -Cr continues to improve, no exhibiting any uremic symptoms. Continues to have good urine output. Can d/c HD catheter especially if not being used for IV access -Will sign off from a nephrology perspective -Strict I/O, maintain purewick. Cont to check PVR's and can do in and out catheterization if needed  2.  Anemia: Previously with some bleeding around her dialysis catheter and some transient hematochezia.  Status post PRBC transfusions, CT 7/10 with gluteal hematoma for which surgery was consulted and is observing for now.  Hb ~8s, transfuse < 7. See below 3.  Altered mental status: Suspected to be multifactorial etiology with lumbar puncture deferred after improvement of mental status with dialysis and reduction of baclofen rate.  MS again worse and had HD 7/11, felt improved; same situation 7/14 - did dialysis and they feel improved.   Robaxin and balofen definitely made things worse and these have been d/c'd  -mental status has been stable. No indications for HD 4.  Sepsis: Suspected to be of pneumonia/urinary tract infection etiology and status post completion of antibiotics. 5.  Granulomatosis with  polyangiitis: On prednisone dose decreased now to 35 mg daily at this time with previous rituximab administered in May.  Dx 15y ago = on chronic methotrexate/rituximab and pred ( sees Dr. Talbert Cage with rheumatology). Per above doubt renal GPA and no plans for renal biopsy at this time.  6.  COVID-19 virus infection: Incidental positive COVID PCR found at admission.  Remains on corticosteroids and did not get remdesivir due to elevated LFTs. 7.  Adrenal insufficiency 8  H/O PE: 03/2022 on CTA; had been on apixiban but held due to anemia requiring repeated transfusion - currently off heparin gtt-per primary 9. Right hip pain-hematoma -w/u + mgmt per primary. A/C on hold  Nothing else to add from a nephrology perspective at this time especially as her kidney function continues to improve. Management of rt hip pain/hematoma per primary service. Will need nephrology follow up, please inform us when she is getting closer to discharge so we can arrange for a follow up at either our [place] or [place] office. Will sign off. Please call with any questions/concerns.  Subjective:   To receive 1u prbc for hgb 6.8. still with rt hip pain, unable to move that leg. Anticoagulation has stopped, open for CT scan per RN. Otherwise no other complaints.   Objective:   BP (!) 137/93 (BP Location: Left Arm)   Pulse 100   Temp 98.2 F (36.8 C) (Oral)   Resp 14   Ht 5\' 5"  (1.651 m)   Wt 96.1 kg   SpO2 98%   BMI  35.26 kg/m   Intake/Output Summary (Last 24 hours) at 05/10/2022 1140 Last data filed at 05/09/2022 2246 Gross per 24 hour  Intake 330 ml  Output 600 ml  Net -270 ml   Weight change:   Physical Exam: Gen: NAD CVS: Pulse regular rhythm, normal rate, S1 and S2 normal Resp: Transmitted breath sounds bilaterally without rales/rhonchi Abd: Soft, obese, nontender, bowel sounds normal Ext: rt hip swelling, firm w/ ecchymotic changes-painful to touch.  Bilateral forefoot amputations noted Neuro:  awake, alert Dialysis access: RIJ temp line c/d/i  Imaging: CT ABDOMEN PELVIS WO CONTRAST  Result Date: 05/10/2022 CLINICAL DATA:  Postoperative anemia. Abdominal pain. EXAM: CT ABDOMEN AND PELVIS WITHOUT CONTRAST TECHNIQUE: Multidetector CT imaging of the abdomen and pelvis was performed following the standard protocol without IV contrast. RADIATION DOSE REDUCTION: This exam was performed according to the departmental dose-optimization program which includes automated exposure control, adjustment of the mA and/or kV according to patient size and/or use of iterative reconstruction technique. COMPARISON:  April 30, 2022 FINDINGS: Lower chest: Mild subpleural ground-glass and nodular opacities in bilateral lung bases. Hepatobiliary: No focal liver abnormality is seen. Status post cholecystectomy. No biliary dilatation. Pancreas: Unremarkable. No pancreatic ductal dilatation or surrounding inflammatory changes. Spleen: Surgical clips are seen in the splenic hilum. Stable configuration of the spleen versus splenules. Adrenals/Urinary Tract: Adrenal glands are unremarkable. Kidneys are normal, without renal calculi, focal lesion, or hydronephrosis. Mild nonspecific bilateral perinephric stranding. Bladder is unremarkable. Stomach/Bowel: Stomach is within normal limits. Post appendectomy. No evidence of bowel wall thickening, distention, or inflammatory changes. Colonic diverticulosis without evidence of diverticulitis. Vascular/Lymphatic: Aortic atherosclerosis. No enlarged abdominal or pelvic lymph nodes. Reproductive: Status post hysterectomy. No adnexal masses. Other: Persistent presacral edema. No evidence of retroperitoneal hematoma. Thoracic stimulator device is in stable position with generator in the left anterior abdominal wall. Musculoskeletal: Diffuse body wall edema. The known right gluteal and piriformis intramuscular hematoma has slightly increased in size and now measures 15.0 by 8.7 by 16.7 cm. Mild  spondylosis of the lumbosacral spine. IMPRESSION: 1. Slight interval increase in size of the known right gluteal and piriformis intramuscular hematoma now measuring 16.7 cm in greatest dimension. 2. No evidence of retroperitoneal hematoma. 3. Colonic diverticulosis without evidence of diverticulitis. 4. Mild subpleural ground-glass and nodular opacities in bilateral lung bases, which may be due to an infectious or inflammatory process. 5. Diffuse body wall edema. Aortic Atherosclerosis (ICD10-I70.0). Electronically Signed   By: Ted Mcalpine M.D.   On: 05/10/2022 11:19    Labs: BMET Recent Labs  Lab 05/04/22 0500 05/05/22 0605 05/06/22 0500 05/07/22 0455 05/08/22 0810 05/09/22 0625 05/10/22 0630  NA 141 142 139 141 145  144 142 141  K 4.4 4.4 4.2 4.5 4.2  4.2 4.7 4.3  CL 109 109 107 105 113*  113* 115* 113*  CO2 22 23 26 22 23  22 22  21*  GLUCOSE 145* 150* 140* 311* 143*  142* 229* 143*  BUN 57* 66* 37* 57* 66*  63* 64* 64*  CREATININE 3.00* 3.02* 1.72* 2.69* 2.63*  2.58* 2.16* 1.86*  CALCIUM 7.6* 7.8* 7.8* 8.2* 8.3*  8.2* 8.5* 8.8*  PHOS 4.8* 5.7* 4.2 4.8* 4.1  --  5.0*   CBC Recent Labs  Lab 05/07/22 0455 05/08/22 0810 05/09/22 0625 05/10/22 0630  WBC 12.9* 15.0* 16.9* 17.2*  HGB 8.3* 8.2* 7.9* 6.8*  HCT 25.0* 25.1* 25.0* 20.5*  MCV 85.9 87.5 89.6 89.9  PLT 217 257 278 278    Medications:  sodium chloride   Intravenous Once   amLODipine  10 mg Oral Daily   Chlorhexidine Gluconate Cloth  6 each Topical Daily   diclofenac Sodium  2 g Topical QID   DULoxetine  30 mg Oral Daily   folic acid  2 mg Oral Daily   guaiFENesin-dextromethorphan  10 mL Oral Q12H   insulin aspart  0-9 Units Subcutaneous TID WC   insulin aspart  2 Units Subcutaneous TID WC   insulin glargine-yfgn  13 Units Subcutaneous QHS   linaclotide  145 mcg Oral QAC breakfast   pantoprazole  40 mg Oral Daily   predniSONE  15 mg Oral Q supper   predniSONE  20 mg Oral Q breakfast   senna   2 tablet Oral BID   sodium chloride flush  10-40 mL Intracatheter Q12H   vitamin B-12  1,000 mcg Oral Daily

## 2022-05-10 NOTE — Progress Notes (Signed)
Bilateral lower extremity venous duplex has been completed. Preliminary results can be found in CV Proc through chart review.   05/10/22 2:15 PM Olen Cordial RVT

## 2022-05-10 NOTE — TOC Progression Note (Signed)
Transition of Care [facility]) - Progression Note    Patient Details  Name: [name] MRN: [id number] Date of Birth: [birth year]  Transition of Care [facility] CM/SW Contact  Baldemar Lenis, [place] Phone Number: 05/10/2022, 3:46 PM  Clinical Narrative:   CSW reached out to patient's spouse to discuss bed offers for SNF, left a voicemail. Awaiting call back.    Expected Discharge Plan: Skilled Nursing Facility Barriers to Discharge: English as a second language teacher, SNF Pending bed offer, Continued Medical Work up  Expected Discharge Plan and Services Expected Discharge Plan: Skilled Nursing Facility In-house Referral: Clinical Social Work Discharge Planning Services: CM Consult Post Acute Care Choice: Skilled Nursing Facility Living arrangements for the past 2 months: Single Family Home                                       Social Determinants of Health (SDOH) Interventions    Readmission Risk Interventions    04/18/2022    4:11 PM 04/05/2022    2:33 PM 03/27/2022   10:12 AM  Readmission Risk Prevention Plan  Transportation Screening Complete Complete Complete  Medication Review Oceanographer) Complete Complete Complete  PCP or Specialist appointment within 3-5 days of discharge Complete Complete   HRI or Home Care Consult Complete Complete Complete  SW Recovery Care/Counseling Consult Complete Complete Complete  Palliative Care Screening Not Applicable Not Applicable Not Applicable  Skilled Nursing Facility Not Applicable Not Applicable Not Applicable

## 2022-05-10 NOTE — Progress Notes (Signed)
Request to IR for IVC filter placement for recurrent bleeding on anticoagulation.  Patient history and imaging reviewed by Dr. Elby Showers who notes that the previous PE was small degree and likely provoked due to sepsis, no prior evidence of prior DVT, no DVT on BLE duplex 03/30/22 or 05/09/22. As such she is not a candidate for IVC filter at this time.  Please call IR with any further questions or concerns.   Lynnette Caffey, PA-C

## 2022-05-10 NOTE — Progress Notes (Signed)
Pt in MRI; RN able to collect T&S.

## 2022-05-10 NOTE — Progress Notes (Signed)
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  She was recently admitted prior to this hospitalization for concerns of pneumonia, underwent bronchoscopy which showed Candida and was seen by infectious disease.  Admitted again on 6/26 with acute renal failure and concerns of UTI/pneumonia therefore started on broad-spectrum antibiotics.  Renal function continued to worsen and nephrology team was consulted.  She also had change in mental status and neurology was consulted who performed EEG which was unremarkable and LP was put on hold.  Currently patient is getting hemodialysis nephrology discretion. Repeat hepadrin drip was challenged but her Hb dropped.    Assessment & Plan:  Principal Problem:   Acute metabolic encephalopathy Active Problems:   Acute cystitis without hematuria   Current chronic use of systemic steroids   Chronic pain   Secondary adrenal insufficiency (HCC)   AKI (acute kidney injury) (HCC)   Granulomatosis with polyangiitis (HCC)   GERD (gastroesophageal reflux disease)   Essential hypertension   Obesity (BMI 30-39.9)   COVID-19 virus infection   Severe sepsis (HCC)   Pulmonary embolism (HCC)   Transaminitis   Rhabdomyolysis    Acute blood loss anemia on anemia of chronic kidney disease Gluteal hematoma -There was some bleeding around HD catheter site and gluteal hematoma.  Required multiple transfusion but now her hemoglobin remained stable.  Was cleared by Gen Surg for Dep Drip on 7/16 but due to her drop in Hb again, hep drip stopped/ Will repeat CT A?P without contrast stat. 1U PRBC ordered.   EGD in 2022-normal Colonoscopy-multiple  diverticula, hemorrhoids with suspicion for fissure. Follows with [facility] heme-onc  AKI/ATN currently requiring HD -Multiple etiology.  Temporary HD catheter placed 7/3.  Cr Slowly improving. HD per nephro - Nephrology sent off multiple biomarkers: *Of note C4, and Kappa light chains* were elevated minimally but otherwise ANCA, ANA, RF, anti-dsDNA, c3, spep/sflc without abnormalities, hepatitis panel, HIV, EBV, CMV were unremarkable   History of pulmonary embolism June 2023 -Looks like she may be bleeding again after challenging her with hep drip once cleared by Dr Freida Busman from Gen Surg. For now will order 1U PRBC tx, order repeat CT A/P without contrast. LE dopplers.  Would benefit from IVC filter placement, will consult IR.   Acute metabolic encephalopathy, POA, multifactorial. Improved.  -Multifactorial from sepsis renal failure and hyponatremia.  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  CNS depressant were discontinued except Cymbalta.  Intrathecal pain pump discontinued.  Per neurology holding off on LP.  CTA head due to slow improvement.  Neurology signed off 7/6.   Polypharmacy risk -Current medications Cymbalta - Home medications included: bupivacaine/zinconotide intrathecal baclofen pump, duloxetine, gabapentin, methocarbamol, nucynta, oxycodone, Topamax.   Chronic pain, neuropathy, and chronic opioid dependence  Right lower Ext and Low back pain - Patient has intrathecal pump with bupivacaine and ziconotide - turned down to lowest setting 7/3 (cannot stop completely per tech) MRI Lumbar spine ordered.    Granulomatosis with polyangiitis  -Follows outpatient now on [facility].  Receiving rituximab and methotrexate. - Current medications prednisone 20 mg in the morning and 15 mg in the evening.  Slowly wean off to 5 mg daily.  Requesting outpatient referral for  rheumatology upon discharge.  They would like to go to [facility].  Secondary adrenal  insufficiency  -Continue steroids    Current chronic use of systemic steroids -At home prednisone 5 mg daily.  Currently on 20/15 mg   Acute hypoxic respiratory failure, POA -Resolved now on room air   Presumed severe sepsis secondary to pneumonia versus UTI -Completed 5 days of antibiotic therapy    GERD  -PPI    COVID-19 virus infection, questionably incidental -Incidental positive finding at the time of admission.  Remdesivir held due to elevated LFTs.  Completed isolation..   Diabetes mellitus type II, insulin-dependent with hyperglycemia -A1c 8.6. OnSemglee to 13U qhs.  Sliding scale and Accu-Cheks.   Obesity  Estimated body mass index is 38.37 kg/m as calculated from the following:   Height as of this encounter: 5\' 5"  (1.651 m).   Weight as of this encounter: 104.6 kg.   PT/OT = SNF TOC aware   DVT prophylaxis: Place and maintain sequential compression device Start: 04/28/22 1251 Code Status: Full code Family Communication: Family at Bedside.   Status is: Inpatient Remains inpatient appropriate because: Maintain hosp stay until cleared by Nephro. Eventually will go to SNF, TOC aware.    Subjective: Right leg is more painful. She has some coughing.   Examination: Constitutional: NAD Respiratory: CTABL Cardiovascular: Normal sinus rhythm, no rubs Abdomen: NT, ND + BS Musculoskeletal: b/l toe amputations.  Skin: some discoloration of right hip. Slightly tense thigh.  Neurologic: CN 2-12 grossly intact.  And nonfocal Psychiatric: Normal judgement and insight. AAOx3   Objective: Vitals:   05/09/22 1201 05/09/22 1514 05/09/22 1951 05/10/22 0300  BP: (!) 146/89 (!) 148/96 (!) 153/98 (!) 163/85  Pulse: 95 95  91  Resp: 15 17    Temp: 98.2 F (36.8 C) 98.4 F (36.9 C) 98.4 F (36.9 C)   TempSrc: Oral Oral Oral   SpO2: 100% 100%  99%  Weight:      Height:        Intake/Output Summary (Last 24 hours) at 05/10/2022 0740 Last data filed at 05/09/2022  2246 Gross per 24 hour  Intake 810 ml  Output 600 ml  Net 210 ml   Filed Weights   05/05/22 1233 05/05/22 1654 05/09/22 0326  Weight: 95.2 kg 95.2 kg 96.1 kg     Data Reviewed:   CBC: Recent Labs  Lab 05/05/22 0605 05/07/22 0455 05/08/22 0810 05/09/22 0625 05/10/22 0630  WBC 13.1* 12.9* 15.0* 16.9* 17.2*  HGB 8.1* 8.3* 8.2* 7.9* 6.8*  HCT 24.1* 25.0* 25.1* 25.0* 20.5*  MCV 85.5 85.9 87.5 89.6 89.9  PLT 216 217 257 278 278   Basic Metabolic Panel: Recent Labs  Lab 05/04/22 0500 05/05/22 0605 05/06/22 0500 05/07/22 0455 05/08/22 0810 05/09/22 0625  NA 141 142 139 141 145  144 142  K 4.4 4.4 4.2 4.5 4.2  4.2 4.7  CL 109 109 107 105 113*  113* 115*  CO2 22 23 26 22 23  22 22   GLUCOSE 145* 150* 140* 311* 143*  142* 229*  BUN 57* 66* 37* 57* 66*  63* 64*  CREATININE 3.00* 3.02* 1.72* 2.69* 2.63*  2.58* 2.16*  CALCIUM 7.6* 7.8* 7.8* 8.2* 8.3*  8.2* 8.5*  MG  --   --   --  1.5* 1.5* 1.9  PHOS 4.8* 5.7* 4.2 4.8* 4.1  --    GFR: Estimated Creatinine Clearance: 33.3 mL/min (A) (by C-G formula based on SCr of 2.16 mg/dL (H)). Liver Function  Tests: Recent Labs  Lab 05/05/22 0605 05/06/22 0500 05/07/22 0455 05/08/22 0810 05/09/22 0625  AST  --   --  19 15 16   ALT  --   --  22 21 19   ALKPHOS  --   --  81 73 88  BILITOT  --   --  1.0 1.0 1.2  PROT  --   --  4.8* 5.1* 5.0*  ALBUMIN 2.3* 2.4* 2.6* 2.8*  2.8* 2.9*   No results for input(s): "LIPASE", "AMYLASE" in the last 168 hours. No results for input(s): "AMMONIA" in the last 168 hours. Coagulation Profile: No results for input(s): "INR", "PROTIME" in the last 168 hours. Cardiac Enzymes: No results for input(s): "CKTOTAL", "CKMB", "CKMBINDEX", "TROPONINI" in the last 168 hours. BNP (last 3 results) No results for input(s): "PROBNP" in the last 8760 hours. HbA1C: No results for input(s): "HGBA1C" in the last 72 hours. CBG: Recent Labs  Lab 05/09/22 0740 05/09/22 1205 05/09/22 1557 05/09/22 2125  05/10/22 0630  GLUCAP 211* 240* 243* 236* 148*   Lipid Profile: No results for input(s): "CHOL", "HDL", "LDLCALC", "TRIG", "CHOLHDL", "LDLDIRECT" in the last 72 hours. Thyroid Function Tests: No results for input(s): "TSH", "T4TOTAL", "FREET4", "T3FREE", "THYROIDAB" in the last 72 hours. Anemia Panel: No results for input(s): "VITAMINB12", "FOLATE", "FERRITIN", "TIBC", "IRON", "RETICCTPCT" in the last 72 hours. Sepsis Labs: No results for input(s): "PROCALCITON", "LATICACIDVEN" in the last 168 hours.  No results found for this or any previous visit (from the past 240 hour(s)).       Radiology Studies: No results found.      Scheduled Meds:  sodium chloride   Intravenous Once   amLODipine  10 mg Oral Daily   Chlorhexidine Gluconate Cloth  6 each Topical Daily   diclofenac Sodium  2 g Topical QID   DULoxetine  30 mg Oral Daily   folic acid  2 mg Oral Daily   guaiFENesin-dextromethorphan  10 mL Oral Q12H   insulin aspart  0-9 Units Subcutaneous TID WC   insulin aspart  2 Units Subcutaneous TID WC   insulin glargine-yfgn  13 Units Subcutaneous QHS   linaclotide  145 mcg Oral QAC breakfast   pantoprazole  40 mg Oral Daily   predniSONE  15 mg Oral Q supper   predniSONE  20 mg Oral Q breakfast   senna  2 tablet Oral BID   sodium chloride flush  10-40 mL Intracatheter Q12H   vitamin B-12  1,000 mcg Oral Daily   Continuous Infusions:     LOS: 23 days   Time spent= 35 mins    Rome Schlauch [id number], MD [facility]  If 7PM-7AM, please contact night-coverage  05/10/2022, 7:40 AM

## 2022-05-10 NOTE — Progress Notes (Signed)
PT Cancellation Note  Patient Details Name: [name] MRN: [id number] DOB: [birth year]   Cancelled Treatment:    Reason Eval/Treat Not Completed: Patient declined. Not feeling well this afternoon. Getting blood product. Will hold scheduled therapy for today.    Berton Mount 05/10/2022, 4:18 PM

## 2022-05-11 DIAGNOSIS — G9341 Metabolic encephalopathy: Secondary | ICD-10-CM | POA: Diagnosis not present

## 2022-05-11 LAB — CBC
HCT: 23.5 % — ABNORMAL LOW (ref 36.0–46.0)
Hemoglobin: 7.6 g/dL — ABNORMAL LOW (ref 12.0–15.0)
MCH: 28.8 pg (ref 26.0–34.0)
MCHC: 32.3 g/dL (ref 30.0–36.0)
MCV: 89 fL (ref 80.0–100.0)
Platelets: 237 10*3/uL (ref 150–400)
RBC: 2.64 MIL/uL — ABNORMAL LOW (ref 3.87–5.11)
RDW: 17.9 % — ABNORMAL HIGH (ref 11.5–15.5)
WBC: 11.2 10*3/uL — ABNORMAL HIGH (ref 4.0–10.5)
nRBC: 0.3 % — ABNORMAL HIGH (ref 0.0–0.2)

## 2022-05-11 LAB — GLUCOSE, CAPILLARY
Glucose-Capillary: 236 mg/dL — ABNORMAL HIGH (ref 70–99)
Glucose-Capillary: 215 mg/dL — ABNORMAL HIGH (ref 70–99)
Glucose-Capillary: 218 mg/dL — ABNORMAL HIGH (ref 70–99)
Glucose-Capillary: 253 mg/dL — ABNORMAL HIGH (ref 70–99)
Glucose-Capillary: 290 mg/dL — ABNORMAL HIGH (ref 70–99)

## 2022-05-11 LAB — RENAL FUNCTION PANEL
Albumin: 2.7 g/dL — ABNORMAL LOW (ref 3.5–5.0)
Anion gap: 8 (ref 5–15)
BUN: 64 mg/dL — ABNORMAL HIGH (ref 6–20)
CO2: 21 mmol/L — ABNORMAL LOW (ref 22–32)
Calcium: 8.7 mg/dL — ABNORMAL LOW (ref 8.9–10.3)
Chloride: 111 mmol/L (ref 98–111)
Creatinine, Ser: 1.87 mg/dL — ABNORMAL HIGH (ref 0.44–1.00)
GFR, Estimated: 31 mL/min — ABNORMAL LOW (ref >60)
Glucose, Bld: 348 mg/dL — ABNORMAL HIGH (ref 70–99)
Phosphorus: 5.2 mg/dL — ABNORMAL HIGH (ref 2.5–4.6)
Potassium: 5.3 mmol/L — ABNORMAL HIGH (ref 3.5–5.1)
Sodium: 140 mmol/L (ref 135–145)

## 2022-05-11 LAB — PREPARE RBC (CROSSMATCH)

## 2022-05-11 LAB — MAGNESIUM: Magnesium: 2 mg/dL (ref 1.7–2.4)

## 2022-05-11 LAB — PHOSPHORUS: Phosphorus: 5.2 mg/dL — ABNORMAL HIGH (ref 2.5–4.6)

## 2022-05-11 LAB — HEMOGLOBIN AND HEMATOCRIT, BLOOD
HCT: 27.8 % — ABNORMAL LOW (ref 36.0–46.0)
Hemoglobin: 8.9 g/dL — ABNORMAL LOW (ref 12.0–15.0)

## 2022-05-11 MED ORDER — PREDNISONE 5 MG PO TABS
10.0000 mg | ORAL_TABLET | Freq: Two times a day (BID) | ORAL | Status: DC
  Administered 2022-05-11 – 2022-05-15 (×9): 10 mg via ORAL
  Filled 2022-05-11 (×9): qty 2

## 2022-05-11 MED ORDER — SODIUM CHLORIDE 0.9% IV SOLUTION: Freq: Once | INTRAVENOUS | Status: AC

## 2022-05-11 MED ORDER — INSULIN GLARGINE-YFGN 100 UNIT/ML ~~LOC~~ SOLN
15.0000 [IU] | Freq: Every day | SUBCUTANEOUS | Status: DC
  Administered 2022-05-11 – 2022-05-17 (×7): 15 [IU] via SUBCUTANEOUS
  Filled 2022-05-11 (×8): qty 0.15

## 2022-05-11 MED ORDER — HYDROXYZINE HCL 25 MG PO TABS
25.0000 mg | ORAL_TABLET | Freq: Four times a day (QID) | ORAL | Status: DC | PRN
Start: 2022-05-11 — End: 2022-05-17
  Administered 2022-05-11 – 2022-05-17 (×9): 25 mg via ORAL
  Filled 2022-05-11 (×9): qty 1

## 2022-05-11 MED ORDER — INSULIN ASPART 100 UNIT/ML IJ SOLN
5.0000 [IU] | Freq: Three times a day (TID) | INTRAMUSCULAR | Status: DC
  Administered 2022-05-11 – 2022-05-17 (×16): 5 [IU] via SUBCUTANEOUS

## 2022-05-11 MED ORDER — SODIUM ZIRCONIUM CYCLOSILICATE 10 G PO PACK
10.0000 g | PACK | Freq: Once | ORAL | Status: AC
Start: 2022-05-11 — End: 2022-05-11
  Administered 2022-05-11: 10 g via ORAL
  Filled 2022-05-11: qty 1

## 2022-05-11 MED ORDER — OXYCODONE HCL 5 MG PO TABS
5.0000 mg | ORAL_TABLET | ORAL | Status: DC | PRN
  Administered 2022-05-11 – 2022-05-16 (×21): 5 mg via ORAL
  Filled 2022-05-11 (×21): qty 1

## 2022-05-11 NOTE — Plan of Care (Signed)

## 2022-05-11 NOTE — Progress Notes (Signed)
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  She was recently admitted prior to this hospitalization for concerns of pneumonia, underwent bronchoscopy which showed Candida and was seen by infectious disease.  Admitted again on 6/26 with acute renal failure and concerns of UTI/pneumonia therefore started on broad-spectrum antibiotics.  Renal function continued to worsen and nephrology team was consulted.  She also had change in mental status and neurology was consulted who performed EEG which was unremarkable and LP was put on hold.  Nephrology eventually signed out as renal function stabilized.  Placed on heparin again but did not tolerate and had drop in hemoglobin again.  Started getting transfusion.   Assessment & Plan:  Principal Problem:   Acute metabolic encephalopathy Active Problems:   Acute cystitis without hematuria   Current chronic use of systemic steroids   Chronic pain   Secondary adrenal insufficiency (HCC)   AKI (acute kidney injury) (HCC)   Granulomatosis with polyangiitis (HCC)   GERD (gastroesophageal reflux disease)   Essential hypertension   Obesity (BMI 30-39.9)   COVID-19 virus infection   Severe sepsis (HCC)   Pulmonary embolism (HCC)   Transaminitis   Rhabdomyolysis    Acute blood loss anemia on anemia of chronic kidney disease Gluteal hematoma -There was some bleeding around HD catheter site and gluteal hematoma.  Required multiple transfusion but now her hemoglobin remained stable.  Was cleared by Gen Surg for Dep Drip on 7/16 but patient had drop in hemoglobin again which was confirmed on CT abdomen pelvis with some  extension of right gluteal hematoma.  Another unit of PRBC transfusion this morning will be ordered.  EGD in 2022-normal Colonoscopy-multiple diverticula, hemorrhoids with suspicion for fissure. Follows with [facility] heme-onc  AKI/ATN currently requiring HD -Multiple etiology.  Temporary HD cath placed 7/3.  Received few sessions of dialysis but now renal function has stabilized, nephrology signed off. - Nephrology sent off multiple biomarkers: *Of note C4, and Kappa light chains* were elevated minimally but otherwise ANCA, ANA, RF, anti-dsDNA, c3, spep/sflc without abnormalities, hepatitis panel, HIV, EBV, CMV were unremarkable   History of pulmonary embolism June 2023 -Looks like she may be bleeding again after challenging her with hep drip once cleared by Dr Freida Busman from Gen Surg.  Patient received PRBC transfusion, repeat Doppler still negative for DVT.  Case discussed with IR who reviewed previous scans.  Patient is not a candidate for IVC filter.  Mild hyperkalemia - 1 dose Lokelma  Acute metabolic encephalopathy, POA, multifactorial. Improved.  - Resolved, work-up including MRI, EEG negative.  Neurology signed off 7/6   Polypharmacy risk -Current medications Cymbalta - Home medications included: bupivacaine/zinconotide intrathecal baclofen pump, duloxetine, gabapentin, methocarbamol, nucynta, oxycodone, Topamax.   Chronic pain, neuropathy, and chronic opioid dependence  Right lower Ext and Low back pain - Patient has intrathecal pump with bupivacaine and ziconotide - turned down to lowest setting 7/3 (cannot stop completely per tech) MRI Lumbar spine shows some strain in her back muscles and gluteal hematoma/hemorrhage.   Granulomatosis with polyangiitis  -Follows outpatient now [facility].  Receiving rituximab and methotrexate. - I will reduce her prednisone to 10 mg twice daily, eventually transition to 5 mg.  Requesting outpatient referral for  rheumatology upon discharge.  They  would like to go to [facility].  Secondary adrenal insufficiency  -Continue steroids    Current chronic use of systemic steroids -At home prednisone 5 mg daily.  Currently on 10 BID   Acute hypoxic respiratory failure, POA -Resolved now on room air   Presumed severe sepsis secondary to pneumonia versus UTI -Completed 5 days of antibiotic therapy    GERD  -PPI    COVID-19 virus infection, questionably incidental -Incidental positive finding at the time of admission.  Remdesivir held due to elevated LFTs.  Completed isolation..   Diabetes mellitus type II, insulin-dependent with hyperglycemia -A1c 8.6.  Accu-Chek sliding scale - Increase Semglee to 15 units and NovoLog Premeal to 5 units 3 times daily AC   Obesity  Estimated body mass index is 38.37 kg/m as calculated from the following:   Height as of this encounter: 5\' 5"  (1.651 m).   Weight as of this encounter: 104.6 kg.   PT/OT = SNF TOC aware   DVT prophylaxis: Place and maintain sequential compression device Start: 04/28/22 1251 Code Status: Full code Family Communication: Family at bedside  Status is: Inpatient Remains inpatient appropriate because: Hopefully if hemoglobin remained stable over next 24 hours she can be discharged  Subjective: Right gluteal/hip pain is better today.  I have asked her to avoid IV morphine as much as possible today.  Examination: Constitutional: Not in acute distress Respiratory: Clear to auscultation bilaterally Cardiovascular: Normal sinus rhythm, no rubs Abdomen: Nontender nondistended good bowel sounds Musculoskeletal: Bilateral lower extremity toe amputations Skin: Some discoloration of the right hip due to hematoma Neurologic: CN 2-12 grossly intact.  And nonfocal Psychiatric: Normal judgment and insight. Alert and oriented x 3. Normal mood.   Objective: Vitals:   05/10/22 2032 05/10/22 2348 05/11/22 0456 05/11/22 0734  BP:   129/74 (!) 145/84  Pulse:    89  Resp:   12 14   Temp: 98.2 F (36.8 C) 98.4 F (36.9 C)  98 F (36.7 C)  TempSrc: Oral Oral  Oral  SpO2:  98%  96%  Weight:      Height:        Intake/Output Summary (Last 24 hours) at 05/11/2022 0801 Last data filed at 05/10/2022 1809 Gross per 24 hour  Intake 897.75 ml  Output 400 ml  Net 497.75 ml   Filed Weights   05/05/22 1233 05/05/22 1654 05/09/22 0326  Weight: 95.2 kg 95.2 kg 96.1 kg     Data Reviewed:   CBC: Recent Labs  Lab 05/07/22 0455 05/08/22 0810 05/09/22 0625 05/10/22 0630 05/10/22 2048 05/11/22 0410  WBC 12.9* 15.0* 16.9* 17.2*  --  11.2*  HGB 8.3* 8.2* 7.9* 6.8* 7.3* 7.6*  HCT 25.0* 25.1* 25.0* 20.5* 22.3* 23.5*  MCV 85.9 87.5 89.6 89.9  --  89.0  PLT 217 257 278 278  --  237   Basic Metabolic Panel: Recent Labs  Lab 05/06/22 0500 05/07/22 0455 05/08/22 0810 05/09/22 0625 05/10/22 0630 05/11/22 0410  NA 139 141 145  144 142 141 140  K 4.2 4.5 4.2  4.2 4.7 4.3 5.3*  CL 107 105 113*  113* 115* 113* 111  CO2 26 22 23  22 22  21* 21*  GLUCOSE 140* 311* 143*  142* 229* 143* 348*  BUN 37* 57* 66*  63* 64* 64* 64*  CREATININE 1.72* 2.69* 2.63*  2.58* 2.16* 1.86* 1.87*  CALCIUM 7.8* 8.2* 8.3*  8.2* 8.5* 8.8* 8.7*  MG  --  1.5* 1.5* 1.9 1.6* 2.0  PHOS 4.2 4.8* 4.1  --  5.0* 5.2*  5.2*   GFR: Estimated Creatinine Clearance: 38.5 mL/min (A) (by C-G formula based on SCr of 1.87 mg/dL (H)). Liver Function Tests: Recent Labs  Lab 05/07/22 0455 05/08/22 0810 05/09/22 0625 05/10/22 0630 05/11/22 0410  AST 19 15 16   --   --   ALT 22 21 19   --   --   ALKPHOS 81 73 88  --   --   BILITOT 1.0 1.0 1.2  --   --   PROT 4.8* 5.1* 5.0*  --   --   ALBUMIN 2.6* 2.8*  2.8* 2.9* 2.9* 2.7*   No results for input(s): "LIPASE", "AMYLASE" in the last 168 hours. No results for input(s): "AMMONIA" in the last 168 hours. Coagulation Profile: No results for input(s): "INR", "PROTIME" in the last 168 hours. Cardiac Enzymes: No results for input(s): "CKTOTAL",  "CKMB", "CKMBINDEX", "TROPONINI" in the last 168 hours. BNP (last 3 results) No results for input(s): "PROBNP" in the last 8760 hours. HbA1C: No results for input(s): "HGBA1C" in the last 72 hours. CBG: Recent Labs  Lab 05/10/22 0630 05/10/22 1135 05/10/22 1638 05/10/22 2135 05/11/22 0639  GLUCAP 148* 146* 134* 352* 236*   Lipid Profile: No results for input(s): "CHOL", "HDL", "LDLCALC", "TRIG", "CHOLHDL", "LDLDIRECT" in the last 72 hours. Thyroid Function Tests: No results for input(s): "TSH", "T4TOTAL", "FREET4", "T3FREE", "THYROIDAB" in the last 72 hours. Anemia Panel: No results for input(s): "VITAMINB12", "FOLATE", "FERRITIN", "TIBC", "IRON", "RETICCTPCT" in the last 72 hours. Sepsis Labs: No results for input(s): "PROCALCITON", "LATICACIDVEN" in the last 168 hours.  No results found for this or any previous visit (from the past 240 hour(s)).       Radiology Studies: VAS 2136 LOWER EXTREMITY VENOUS (DVT)  Result Date: 05/10/2022  Lower Venous DVT Study Patient Name:  VELDA WENDT  Date of Exam:   05/10/2022 Medical Rec #: Gregary Cromer    Accession #:    05/12/2022 Date of Birth: 1966-01-09    Patient Gender: F Patient Age:   53 years Exam Location:  [facility] Procedure:      VAS 59 LOWER EXTREMITY VENOUS (DVT) Referring Phys: [facility] --------------------------------------------------------------------------------  Indications: Pulmonary embolism.  Risk Factors: Confirmed PE. Anticoagulation: Heparin. Comparison Study: No prior studies. Performing Technologist: [place] RVT  Examination Guidelines: A complete evaluation includes B-mode imaging, spectral Doppler, color Doppler, and power Doppler as needed of all accessible portions of each vessel. Bilateral testing is considered an integral part of a complete examination. Limited examinations for reoccurring indications may be performed as noted. The reflux portion of the exam is performed with the patient in reverse  Trendelenburg.  +---------+---------------+---------+-----------+----------+--------------+ RIGHT    CompressibilityPhasicitySpontaneityPropertiesThrombus Aging +---------+---------------+---------+-----------+----------+--------------+ CFV      Full           Yes      Yes                                 +---------+---------------+---------+-----------+----------+--------------+ SFJ      Full                                                        +---------+---------------+---------+-----------+----------+--------------+ FV Prox  Full                                                        +---------+---------------+---------+-----------+----------+--------------+  FV Mid   Full                                                        +---------+---------------+---------+-----------+----------+--------------+ FV DistalFull                                                        +---------+---------------+---------+-----------+----------+--------------+ PFV      Full                                                        +---------+---------------+---------+-----------+----------+--------------+ POP      Full           Yes      Yes                                 +---------+---------------+---------+-----------+----------+--------------+ PTV      Full                                                        +---------+---------------+---------+-----------+----------+--------------+ PERO     Full                                                        +---------+---------------+---------+-----------+----------+--------------+   +---------+---------------+---------+-----------+----------+--------------+ LEFT     CompressibilityPhasicitySpontaneityPropertiesThrombus Aging +---------+---------------+---------+-----------+----------+--------------+ CFV      Full           Yes      Yes                                  +---------+---------------+---------+-----------+----------+--------------+ SFJ      Full                                                        +---------+---------------+---------+-----------+----------+--------------+ FV Prox  Full                                                        +---------+---------------+---------+-----------+----------+--------------+ FV Mid   Full                                                        +---------+---------------+---------+-----------+----------+--------------+  FV DistalFull                                                        +---------+---------------+---------+-----------+----------+--------------+ PFV      Full                                                        +---------+---------------+---------+-----------+----------+--------------+ POP      Full           Yes      Yes                                 +---------+---------------+---------+-----------+----------+--------------+ PTV      Full                                                        +---------+---------------+---------+-----------+----------+--------------+ PERO     Full                                                        +---------+---------------+---------+-----------+----------+--------------+     Summary: RIGHT: - There is no evidence of deep vein thrombosis in the lower extremity.  - No cystic structure found in the popliteal fossa.  LEFT: - There is no evidence of deep vein thrombosis in the lower extremity.  - No cystic structure found in the popliteal fossa.  *See table(s) above for measurements and observations. Electronically signed by Monica Martinez MD on 05/10/2022 at 2:54:25 PM.    Final    MR LUMBAR SPINE WO CONTRAST  Result Date: 05/10/2022 CLINICAL DATA:  Low back pain, infection suspected. Positive x-ray/CT with myelopathy. EXAM: MRI LUMBAR SPINE WITHOUT CONTRAST TECHNIQUE: Multiplanar, multisequence MR imaging of the  lumbar spine was performed. No intravenous contrast was administered. COMPARISON:  Abdominal CT from earlier today FINDINGS: Segmentation:  5 lumbar type vertebrae Alignment:  Degenerative anterolisthesis at L4-5 Vertebrae:  No fracture, evidence of discitis, or bone lesion. Conus medullaris and cauda equina: Conus extends to the L1-2 level. Conus and cauda equina appear normal. Paraspinal and other soft tissues: There is T2 hyperintensity and mild swelling of right more than left intrinsic back muscles with T1 and T2 hyperintense cyst-like areas with hypointense rim clustered in the superficial right musculature at the level of L2 and L3, individual components measuring up to 11 mm. Similar signal characteristics involve the known right gluteal and piriformis hematoma which is partially covered. Spinal catheter seen entering the canal posteriorly at L1-2. Disc levels: Degeneration primarily affects the L4-5 facets where there is spurring and right-sided joint effusion with anterolisthesis. No neural impingement. IMPRESSION: *Strain of intrinsic back muscles on the right more than left with small volume right-sided intramuscular hemorrhage. Known, extensive hemorrhage in the right gluteal and piriformis musculature which is minimally  covered. 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 *No neural impingement. Electronically Signed   By: Tiburcio Pea M.D.   On: 05/10/2022 13:03   CT ABDOMEN PELVIS WO CONTRAST  Result Date: 05/10/2022 CLINICAL DATA:  Postoperative anemia. Abdominal pain. EXAM: CT ABDOMEN AND PELVIS WITHOUT CONTRAST TECHNIQUE: Multidetector CT imaging of the abdomen and pelvis was performed following the standard protocol without IV contrast. RADIATION DOSE REDUCTION: This exam was performed according to the departmental dose-optimization program which includes automated exposure control, adjustment of the mA and/or kV according to patient size  and/or use of iterative reconstruction technique. COMPARISON:  April 30, 2022 FINDINGS: Lower chest: Mild subpleural ground-glass and nodular opacities in bilateral lung bases. Hepatobiliary: No focal liver abnormality is seen. Status post cholecystectomy. No biliary dilatation. Pancreas: Unremarkable. No pancreatic ductal dilatation or surrounding inflammatory changes. Spleen: Surgical clips are seen in the splenic hilum. Stable configuration of the spleen versus splenules. Adrenals/Urinary Tract: Adrenal glands are unremarkable. Kidneys are normal, without renal calculi, focal lesion, or hydronephrosis. Mild nonspecific bilateral perinephric stranding. Bladder is unremarkable. Stomach/Bowel: Stomach is within normal limits. Post appendectomy. No evidence of bowel wall thickening, distention, or inflammatory changes. Colonic diverticulosis without evidence of diverticulitis. Vascular/Lymphatic: Aortic atherosclerosis. No enlarged abdominal or pelvic lymph nodes. Reproductive: Status post hysterectomy. No adnexal masses. Other: Persistent presacral edema. No evidence of retroperitoneal hematoma. Thoracic stimulator device is in stable position with generator in the left anterior abdominal wall. Musculoskeletal: Diffuse body wall edema. The known right gluteal and piriformis intramuscular hematoma has slightly increased in size and now measures 15.0 by 8.7 by 16.7 cm. Mild spondylosis of the lumbosacral spine. IMPRESSION: 1. Slight interval increase in size of the known right gluteal and piriformis intramuscular hematoma now measuring 16.7 cm in greatest dimension. 2. No evidence of retroperitoneal hematoma. 3. Colonic diverticulosis without evidence of diverticulitis. 4. Mild subpleural ground-glass and nodular opacities in bilateral lung bases, which may be due to an infectious or inflammatory process. 5. Diffuse body wall edema. Aortic Atherosclerosis (ICD10-I70.0). Electronically Signed   By: Ted Mcalpine  M.D.   On: 05/10/2022 11:19        Scheduled Meds:  sodium chloride   Intravenous Once   amLODipine  10 mg Oral Daily   Chlorhexidine Gluconate Cloth  6 each Topical Daily   diclofenac Sodium  2 g Topical QID   DULoxetine  30 mg Oral Daily   folic acid  2 mg Oral Daily   guaiFENesin-dextromethorphan  10 mL Oral Q12H   insulin aspart  0-9 Units Subcutaneous TID WC   insulin aspart  5 Units Subcutaneous TID WC   insulin glargine-yfgn  15 Units Subcutaneous QHS   linaclotide  145 mcg Oral QAC breakfast   pantoprazole  40 mg Oral Daily   predniSONE  15 mg Oral Q supper   predniSONE  20 mg Oral Q breakfast   senna  2 tablet Oral BID   sodium chloride flush  10-40 mL Intracatheter Q12H   sodium zirconium cyclosilicate  10 g Oral Once   vitamin B-12  1,000 mcg Oral Daily   Continuous Infusions:     LOS: 24 days   Time spent= 35 mins    Jaiyana Canale Joline Maxcy, MD [facility]  If 7PM-7AM, please contact night-coverage  05/11/2022, 8:01 AM

## 2022-05-11 NOTE — TOC Progression Note (Signed)
Transition of Care [facility]) - Progression Note    Patient Details  Name: [name] MRN: [id number] Date of Birth: [birth year]  [facility] CM/SW Contact  Baldemar Lenis, [place] Phone Number: 05/11/2022, 3:02 PM  Clinical Narrative:   CSW spoke with patient's spouse, [name], earlier today to discuss SNF offers. [name] asking if possible for patient to be a candidate for AIR, as he has concerns about SNF rehab and the patient not getting the care and rehab that she needs. CSW explained need for therapy to reevaluate patient to see if she could tolerate it, and [name] indicated understanding. Patient pending pickup for PT and OT today, CSW will follow for therapy sessions. CSW also provided [name] with SNF bed offers for him to review, in case patient is not an AIR candidate. CSW to follow.    Expected Discharge Plan: Skilled Nursing Facility Barriers to Discharge: English as a second language teacher, SNF Pending bed offer, Continued Medical Work up  Expected Discharge Plan and Services Expected Discharge Plan: Skilled Nursing Facility In-house Referral: Clinical Social Work Discharge Planning Services: CM Consult Post Acute Care Choice: Skilled Nursing Facility Living arrangements for the past 2 months: Single Family Home                                       Social Determinants of Health (SDOH) Interventions    Readmission Risk Interventions    04/18/2022    4:11 PM 04/05/2022    2:33 PM 03/27/2022   10:12 AM  Readmission Risk Prevention Plan  Transportation Screening Complete Complete Complete  Medication Review Oceanographer) Complete Complete Complete  PCP or Specialist appointment within 3-5 days of discharge Complete Complete   HRI or Home Care Consult Complete Complete Complete  SW Recovery Care/Counseling Consult Complete Complete Complete  Palliative Care Screening Not Applicable Not Applicable Not Applicable  Skilled Nursing Facility Not Applicable Not Applicable  Not Applicable

## 2022-05-12 DIAGNOSIS — G9341 Metabolic encephalopathy: Secondary | ICD-10-CM | POA: Diagnosis not present

## 2022-05-12 LAB — TYPE AND SCREEN
ABO/RH(D): O POS
Antibody Screen: NEGATIVE
Unit division: 0
Unit division: 0

## 2022-05-12 LAB — RENAL FUNCTION PANEL
Albumin: 2.8 g/dL — ABNORMAL LOW (ref 3.5–5.0)
Anion gap: 7 (ref 5–15)
BUN: 52 mg/dL — ABNORMAL HIGH (ref 6–20)
CO2: 22 mmol/L (ref 22–32)
Calcium: 8.7 mg/dL — ABNORMAL LOW (ref 8.9–10.3)
Chloride: 115 mmol/L — ABNORMAL HIGH (ref 98–111)
Creatinine, Ser: 1.52 mg/dL — ABNORMAL HIGH (ref 0.44–1.00)
GFR, Estimated: 40 mL/min — ABNORMAL LOW (ref >60)
Glucose, Bld: 134 mg/dL — ABNORMAL HIGH (ref 70–99)
Phosphorus: 3.5 mg/dL (ref 2.5–4.6)
Potassium: 4.8 mmol/L (ref 3.5–5.1)
Sodium: 144 mmol/L (ref 135–145)

## 2022-05-12 LAB — GLUCOSE, CAPILLARY
Glucose-Capillary: 97 mg/dL (ref 70–99)
Glucose-Capillary: 84 mg/dL (ref 70–99)
Glucose-Capillary: 203 mg/dL — ABNORMAL HIGH (ref 70–99)
Glucose-Capillary: 145 mg/dL — ABNORMAL HIGH (ref 70–99)

## 2022-05-12 LAB — CBC
HCT: 29.3 % — ABNORMAL LOW (ref 36.0–46.0)
Hemoglobin: 9.6 g/dL — ABNORMAL LOW (ref 12.0–15.0)
MCH: 28 pg (ref 26.0–34.0)
MCHC: 32.8 g/dL (ref 30.0–36.0)
MCV: 85.4 fL (ref 80.0–100.0)
Platelets: 229 10*3/uL (ref 150–400)
RBC: 3.43 MIL/uL — ABNORMAL LOW (ref 3.87–5.11)
RDW: 18.5 % — ABNORMAL HIGH (ref 11.5–15.5)
WBC: 11 10*3/uL — ABNORMAL HIGH (ref 4.0–10.5)
nRBC: 0.3 % — ABNORMAL HIGH (ref 0.0–0.2)

## 2022-05-12 LAB — BPAM RBC
Blood Product Expiration Date: 202307262359
Blood Product Expiration Date: 202308222359
ISSUE DATE / TIME: 202307191519
ISSUE DATE / TIME: 202307201606
Unit Type and Rh: 5100
Unit Type and Rh: 5100

## 2022-05-12 LAB — MAGNESIUM: Magnesium: 1.5 mg/dL — ABNORMAL LOW (ref 1.7–2.4)

## 2022-05-12 MED ORDER — MAGNESIUM SULFATE 2 GM/50ML IV SOLN
2.0000 g | Freq: Once | INTRAVENOUS | Status: AC
  Administered 2022-05-12: 2 g via INTRAVENOUS
  Filled 2022-05-12: qty 50

## 2022-05-12 NOTE — Progress Notes (Signed)
Occupational Therapy Treatment Patient Details Name: [name] MRN: [id number] DOB: 04/16/1966 Today's Date: 05/12/2022   History of present illness Pt is a 56 y.o. female admitted 6/26 after being found by her husband on the floor at home with AMS. Admitted with dx of acute metabolic encephalopathy. Incidental COVID+ on admission. Initially admitted to [facility] and transferred to [facility] 7/1 for initiation of HD. Multiple recent hospitalizations, 4/3-5/4 and 6/4-6/14. PMH: PE, Wegener's disease, CKD 3, DM, HTN, chronic pain with an intrathecal pump, OSA, bilat mid-foot amputations, peripheral neuropathy   OT comments  Pt supine in bed and agreeable to OT/PT session. She remains limited by pain in R hip/back, encouraged mobility and pt agreeable.  Completes transition to EOB with supervision, ADLS at EOB with max assist +2 for LB and setup for grooming.  Sit to stand at EOB (using recliner) with max assist +2.  Continue to recommend SNF at this time. Will follow acutely.    Recommendations for follow up therapy are one component of a multi-disciplinary discharge planning process, led by the attending physician.  Recommendations may be updated based on patient status, additional functional criteria and insurance authorization.    Follow Up Recommendations  Skilled nursing-short term rehab (<3 hours/day)    Assistance Recommended at Discharge Frequent or constant Supervision/Assistance  Patient can return home with the following  A lot of help with walking and/or transfers;A lot of help with bathing/dressing/bathroom;Assistance with cooking/housework;Assistance with feeding;Direct supervision/assist for medications management;Direct supervision/assist for financial management;Assist for transportation;Help with stairs or ramp for entrance   Equipment Recommendations  None recommended by OT;Other (comment)    Recommendations for Other Services PT consult    Precautions / Restrictions  Precautions Precautions: Fall Precaution Comments: h/o bilateral transmet amputations, central line Restrictions Weight Bearing Restrictions: No       Mobility Bed Mobility Overal bed mobility: Needs Assistance Bed Mobility: Supine to Sit, Sit to Supine     Supine to sit: Supervision Sit to supine: Min assist        Transfers Overall transfer level: Needs assistance Equipment used:  (back of recliner used for support) Transfers: Sit to/from Stand Sit to Stand: Max assist, +2 safety/equipment, +2 physical assistance           General transfer comment: declining steady, agreeable to using back of recliner to pull up on.  Max assist +2 to power up fully from EOB.Pt limited by pain.     Balance Overall balance assessment: Needs assistance Sitting-balance support: No upper extremity supported, Feet supported Sitting balance-Leahy Scale: Good     Standing balance support: Bilateral upper extremity supported, During functional activity Standing balance-Leahy Scale: Poor Standing balance comment: relies on BUE and external support                           ADL either performed or assessed with clinical judgement   ADL Overall ADL's : Needs assistance/impaired     Grooming: Set up;Oral care;Sitting Grooming Details (indicate cue type and reason): sitting EOB             Lower Body Dressing: Maximal assistance;Sit to/from stand;+2 for physical assistance;+2 for safety/equipment Lower Body Dressing Details (indicate cue type and reason): able to don L sock (figure 4 technique) but unable to manage R sock due to pain, +2 in standing   Toilet Transfer Details (indicate cue type and reason): unable         Functional mobility during  ADLs: Maximal assistance;+2 for physical assistance;+2 for safety/equipment (sit to stand)      Extremity/Trunk Assessment              Vision       Perception     Praxis      Cognition Arousal/Alertness:  Awake/alert Behavior During Therapy: Flat affect Overall Cognitive Status: Within Functional Limits for tasks assessed                                 General Comments: some slow processing, but oriented and follows commands.  appears WFL.        Exercises      Shoulder Instructions       General Comments VSS on RA    Pertinent Vitals/ Pain       Pain Assessment Pain Assessment: Faces Faces Pain Scale: Hurts whole lot Pain Location: R hip/back Pain Descriptors / Indicators: Discomfort, Grimacing Pain Intervention(s): Limited activity within patient's tolerance, Monitored during session, Repositioned  Home Living                                          Prior Functioning/Environment              Frequency  Min 2X/week        Progress Toward Goals  OT Goals(current goals can now be found in the care plan section)  Progress towards OT goals: Progressing toward goals  Acute Rehab OT Goals Patient Stated Goal: decrease pain OT Goal Formulation: With patient Time For Goal Achievement: 05/26/22 Potential to Achieve Goals: Good  Plan Discharge plan remains appropriate;Frequency remains appropriate    Co-evaluation    PT/OT/SLP Co-Evaluation/Treatment: Yes Reason for Co-Treatment: For patient/therapist safety;To address functional/ADL transfers   OT goals addressed during session: ADL's and self-care      AM-PAC OT "6 Clicks" Daily Activity     Outcome Measure   Help from another person eating meals?: A Little Help from another person taking care of personal grooming?: A Little Help from another person toileting, which includes using toliet, bedpan, or urinal?: Total Help from another person bathing (including washing, rinsing, drying)?: A Lot Help from another person to put on and taking off regular upper body clothing?: A Little Help from another person to put on and taking off regular lower body clothing?: A Lot 6  Click Score: 14    End of Session Equipment Utilized During Treatment: Gait belt  OT Visit Diagnosis: Unsteadiness on feet (R26.81);Other abnormalities of gait and mobility (R26.89);Muscle weakness (generalized) (M62.81) Pain - Right/Left: Right Pain - part of body: Hip (back)   Activity Tolerance Patient limited by pain   Patient Left in bed;with call bell/phone within reach;with nursing/sitter in room;with family/visitor present   Nurse Communication Mobility status        Time: 5397-6734 OT Time Calculation (min): 31 min  Charges: OT General Charges $OT Visit: 1 Visit OT Treatments $Self Care/Home Management : 8-22 mins  Barry Brunner, OT Acute Rehabilitation Services Office [phone]   Chancy Milroy 05/12/2022, 1:22 PM

## 2022-05-12 NOTE — Progress Notes (Signed)
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  She was recently admitted prior to this hospitalization for concerns of pneumonia, underwent bronchoscopy which showed Candida and was seen by infectious disease.  Admitted again on 6/26 with acute renal failure and concerns of UTI/pneumonia therefore started on broad-spectrum antibiotics.  Renal function continued to worsen and nephrology team was consulted.  She also had change in mental status and neurology was consulted who performed EEG which was unremarkable and LP was put on hold.  Nephrology eventually signed out as renal function stabilized.  Placed on heparin again but did not tolerate and had drop in hemoglobin again.  Started getting transfusion. Now off AC, her hb remains stable. Family working with [facility] for [facility].    Assessment & Plan:  Principal Problem:   Acute metabolic encephalopathy Active Problems:   Acute cystitis without hematuria   Current chronic use of systemic steroids   Chronic pain   Secondary adrenal insufficiency (HCC)   AKI (acute kidney injury) (HCC)   Granulomatosis with polyangiitis (HCC)   GERD (gastroesophageal reflux disease)   Essential hypertension   Obesity (BMI 30-39.9)   COVID-19 virus infection   Severe sepsis (HCC)   Pulmonary embolism (HCC)   Transaminitis   Rhabdomyolysis    Acute blood loss anemia on anemia of chronic kidney disease Gluteal hematoma -There was some bleeding around HD catheter site and gluteal hematoma.  Required multiple transfusion but now her hemoglobin remained stable.  Was cleared by Gen Surg for Dep Drip on 7/16 but patient had drop in  hemoglobin again which was confirmed on CT abdomen pelvis with some extension of right gluteal hematoma. Hb now remains stable. Should remain off AC atl least until 8/5, revaluate thereafter.   EGD in 2022-normal Colonoscopy-multiple diverticula, hemorrhoids with suspicion for fissure. Follows with [facility]  AKI/ATN currently requiring HD -HD catheter is now out. Renal function seems to be improving, Will not need long term HD.  Received few sessions of dialysis but now renal function has stabilized, nephrology signed off. - Nephrology sent off multiple biomarkers: *Of note C4, and Kappa light chains* were elevated minimally but otherwise ANCA, ANA, RF, anti-dsDNA, c3, spep/sflc without abnormalities, hepatitis panel, HIV, EBV, CMV were unremarkable   History of pulmonary embolism June 2023 Bled again after rechallenging her with hep drip once cleared by Dr Freida Busman from Gen Surg. No longer on [facility], keep her off atleast until 8/5 then reevaluate. Patient received PRBC transfusion, repeat Doppler still negative for DVT.  Case discussed with IR who reviewed previous scans.  Patient is not a candidate for IVC filter.  Mild hyperkalemia - Improved.   Acute metabolic encephalopathy, POA, multifactorial. Improved.  - Resolved, work-up including MRI, EEG negative.  Neurology signed off 7/6   Polypharmacy risk -Current medications Cymbalta   Chronic pain, neuropathy, and chronic opioid dependence  Right lower Ext and Low back pain Pain control.  MRI Lumbar spine shows some strain in her back muscles and gluteal hematoma/hemorrhage.   Granulomatosis with polyangiitis  -Follows outpatient now [facility].  Receiving rituximab and methotrexate. - Reduced prednisone to 10 mg twice daily, eventually transition to 5 mg.  Requesting outpatient referral for rheumatology upon discharge.  [facility]  Secondary adrenal insufficiency  -Continue steroids    Current chronic use of  systemic steroids -At home prednisone 5 mg daily.  Currently on 10 BID   Acute hypoxic respiratory failure, POA -Resolved now on room air   Presumed severe sepsis secondary to pneumonia versus UTI -Completed 5 days of antibiotic therapy    GERD  -PPI    COVID-19 virus infection, questionably incidental -Incidental positive finding at the time of admission.  Remdesivir held due to elevated LFTs.  Completed isolation..   Diabetes mellitus type II, insulin-dependent with hyperglycemia -A1c 8.6.  Accu-Chek sliding scale - Increase Semglee to 15 units and NovoLog Premeal to 5 units 3 times daily AC   Obesity  Estimated body mass index is 38.37 kg/m as calculated from the following:   Height as of this encounter: 5\' 5"  (1.651 m).   Weight as of this encounter: 104.6 kg.   PT/OT = SNF TOC aware   DVT prophylaxis: Place and maintain sequential compression device Start: 04/28/22 1251 Code Status: Full code Family Communication: Family at bedside  Status is: Inpatient Remains inpatient appropriate because: Medically stable, waiting on placement.   Subjective: Doing better no complaints. Right hip pain is also better.   Examination: Constitutional: Not in acute distress Respiratory: Clear to auscultation bilaterally Cardiovascular: Normal sinus rhythm, no rubs Abdomen: Nontender nondistended good bowel sounds Musculoskeletal: b/l toe amputations Skin: some discoloration of right hip but soft compared to yesterday Neurologic: CN 2-12 grossly intact.  And nonfocal Psychiatric: Normal judgment and insight. Alert and oriented x 3. Normal mood.     Objective: Vitals:   05/11/22 1633 05/11/22 1945 05/11/22 2056 05/12/22 0507  BP: 124/81 138/80 (!) 141/82 (!) 148/84  Pulse: 94 92 95 91  Resp: 14  18 18   Temp: 98 F (36.7 C) 98.5 F (36.9 C) 98.5 F (36.9 C) 98 F (36.7 C)  TempSrc: Oral Oral Oral   SpO2: 96% 96% 96% 95%  Weight:      Height:        Intake/Output  Summary (Last 24 hours) at 05/12/2022 1144 Last data filed at 05/12/2022 1048 Gross per 24 hour  Intake 853.33 ml  Output 1000 ml  Net -146.67 ml   Filed Weights   05/05/22 1233 05/05/22 1654 05/09/22 0326  Weight: 95.2 kg 95.2 kg 96.1 kg     Data Reviewed:   CBC: Recent Labs  Lab 05/08/22 0810 05/09/22 0625 05/10/22 0630 05/10/22 2048 05/11/22 0410 05/11/22 2156 05/12/22 0605  WBC 15.0* 16.9* 17.2*  --  11.2*  --  11.0*  HGB 8.2* 7.9* 6.8* 7.3* 7.6* 8.9* 9.6*  HCT 25.1* 25.0* 20.5* 22.3* 23.5* 27.8* 29.3*  MCV 87.5 89.6 89.9  --  89.0  --  85.4  PLT 257 278 278  --  237  --  229   Basic Metabolic Panel: Recent Labs  Lab 05/07/22 0455 05/08/22 0810 05/09/22 0625 05/10/22 0630 05/11/22 0410 05/12/22 0605  NA 141 145  144 142 141 140 144  K 4.5 4.2  4.2 4.7 4.3 5.3* 4.8  CL 105 113*  113* 115* 113* 111 115*  CO2 22 23  22 22  21* 21* 22  GLUCOSE 311* 143*  142* 229* 143* 348* 134*  BUN 57* 66*  63* 64* 64* 64* 52*  CREATININE 2.69* 2.63*  2.58* 2.16* 1.86* 1.87* 1.52*  CALCIUM 8.2* 8.3*  8.2* 8.5* 8.8* 8.7* 8.7*  MG 1.5* 1.5* 1.9 1.6*  2.0 1.5*  PHOS 4.8* 4.1  --  5.0* 5.2*  5.2* 3.5   GFR: Estimated Creatinine Clearance: 47.4 mL/min (A) (by C-G formula based on SCr of 1.52 mg/dL (H)). Liver Function Tests: Recent Labs  Lab 05/07/22 0455 05/08/22 0810 05/09/22 0625 05/10/22 0630 05/11/22 0410 05/12/22 0605  AST 19 15 16   --   --   --   ALT 22 21 19   --   --   --   ALKPHOS 81 73 88  --   --   --   BILITOT 1.0 1.0 1.2  --   --   --   PROT 4.8* 5.1* 5.0*  --   --   --   ALBUMIN 2.6* 2.8*  2.8* 2.9* 2.9* 2.7* 2.8*   No results for input(s): "LIPASE", "AMYLASE" in the last 168 hours. No results for input(s): "AMMONIA" in the last 168 hours. Coagulation Profile: No results for input(s): "INR", "PROTIME" in the last 168 hours. Cardiac Enzymes: No results for input(s): "CKTOTAL", "CKMB", "CKMBINDEX", "TROPONINI" in the last 168 hours. BNP (last 3  results) No results for input(s): "PROBNP" in the last 8760 hours. HbA1C: No results for input(s): "HGBA1C" in the last 72 hours. CBG: Recent Labs  Lab 05/11/22 0832 05/11/22 1117 05/11/22 1651 05/11/22 2111 05/12/22 0924  GLUCAP 215* 218* 253* 290* 97   Lipid Profile: No results for input(s): "CHOL", "HDL", "LDLCALC", "TRIG", "CHOLHDL", "LDLDIRECT" in the last 72 hours. Thyroid Function Tests: No results for input(s): "TSH", "T4TOTAL", "FREET4", "T3FREE", "THYROIDAB" in the last 72 hours. Anemia Panel: No results for input(s): "VITAMINB12", "FOLATE", "FERRITIN", "TIBC", "IRON", "RETICCTPCT" in the last 72 hours. Sepsis Labs: No results for input(s): "PROCALCITON", "LATICACIDVEN" in the last 168 hours.  No results found for this or any previous visit (from the past 240 hour(s)).       Radiology Studies: VAS 2112 LOWER EXTREMITY VENOUS (DVT)  Result Date: 05/10/2022  Lower Venous DVT Study Patient Name:  EVEE LISKA  Date of Exam:   05/10/2022 Medical Rec #: Gregary Cromer    Accession #:    05/12/2022 Date of Birth: [birth year]    Patient Gender: F Patient Age:   69 years Exam Location:  [facility] Procedure:      VAS 59 LOWER EXTREMITY VENOUS (DVT) Referring Phys: [facility] --------------------------------------------------------------------------------  Indications: Pulmonary embolism.  Risk Factors: Confirmed PE. Anticoagulation: Heparin. Comparison Study: No prior studies. Performing Technologist: [place] RVT  Examination Guidelines: A complete evaluation includes B-mode imaging, spectral Doppler, color Doppler, and power Doppler as needed of all accessible portions of each vessel. Bilateral testing is considered an integral part of a complete examination. Limited examinations for reoccurring indications may be performed as noted. The reflux portion of the exam is performed with the patient in reverse Trendelenburg.   +---------+---------------+---------+-----------+----------+--------------+ RIGHT    CompressibilityPhasicitySpontaneityPropertiesThrombus Aging +---------+---------------+---------+-----------+----------+--------------+ CFV      Full           Yes      Yes                                 +---------+---------------+---------+-----------+----------+--------------+ SFJ      Full                                                        +---------+---------------+---------+-----------+----------+--------------+  FV Prox  Full                                                        +---------+---------------+---------+-----------+----------+--------------+ FV Mid   Full                                                        +---------+---------------+---------+-----------+----------+--------------+ FV DistalFull                                                        +---------+---------------+---------+-----------+----------+--------------+ PFV      Full                                                        +---------+---------------+---------+-----------+----------+--------------+ POP      Full           Yes      Yes                                 +---------+---------------+---------+-----------+----------+--------------+ PTV      Full                                                        +---------+---------------+---------+-----------+----------+--------------+ PERO     Full                                                        +---------+---------------+---------+-----------+----------+--------------+   +---------+---------------+---------+-----------+----------+--------------+ LEFT     CompressibilityPhasicitySpontaneityPropertiesThrombus Aging +---------+---------------+---------+-----------+----------+--------------+ CFV      Full           Yes      Yes                                  +---------+---------------+---------+-----------+----------+--------------+ SFJ      Full                                                        +---------+---------------+---------+-----------+----------+--------------+ FV Prox  Full                                                        +---------+---------------+---------+-----------+----------+--------------+  FV Mid   Full                                                        +---------+---------------+---------+-----------+----------+--------------+ FV DistalFull                                                        +---------+---------------+---------+-----------+----------+--------------+ PFV      Full                                                        +---------+---------------+---------+-----------+----------+--------------+ POP      Full           Yes      Yes                                 +---------+---------------+---------+-----------+----------+--------------+ PTV      Full                                                        +---------+---------------+---------+-----------+----------+--------------+ PERO     Full                                                        +---------+---------------+---------+-----------+----------+--------------+     Summary: RIGHT: - There is no evidence of deep vein thrombosis in the lower extremity.  - No cystic structure found in the popliteal fossa.  LEFT: - There is no evidence of deep vein thrombosis in the lower extremity.  - No cystic structure found in the popliteal fossa.  *See table(s) above for measurements and observations. Electronically signed by Sherald Hess MD on 05/10/2022 at 2:54:25 PM.    Final    MR LUMBAR SPINE WO CONTRAST  Result Date: 05/10/2022 CLINICAL DATA:  Low back pain, infection suspected. Positive x-ray/CT with myelopathy. EXAM: MRI LUMBAR SPINE WITHOUT CONTRAST TECHNIQUE: Multiplanar, multisequence MR imaging of the  lumbar spine was performed. No intravenous contrast was administered. COMPARISON:  Abdominal CT from earlier today FINDINGS: Segmentation:  5 lumbar type vertebrae Alignment:  Degenerative anterolisthesis at L4-5 Vertebrae:  No fracture, evidence of discitis, or bone lesion. Conus medullaris and cauda equina: Conus extends to the L1-2 level. Conus and cauda equina appear normal. Paraspinal and other soft tissues: There is T2 hyperintensity and mild swelling of right more than left intrinsic back muscles with T1 and T2 hyperintense cyst-like areas with hypointense rim clustered in the superficial right musculature at the level of L2 and L3, individual components measuring up to 11 mm. Similar signal characteristics involve the known right gluteal and piriformis hematoma which is partially covered.  Spinal catheter seen entering the canal posteriorly at L1-2. Disc levels: Degeneration primarily affects the L4-5 facets where there is spurring and right-sided joint effusion with anterolisthesis. No neural impingement. IMPRESSION: *Strain of intrinsic back muscles on the right more than left with small volume right-sided intramuscular hemorrhage. Known, extensive hemorrhage in the right gluteal and piriformis musculature which is minimally covered. 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 *No neural impingement. Electronically Signed   By: Tiburcio Pea M.D.   On: 05/10/2022 13:03        Scheduled Meds:  amLODipine  10 mg Oral Daily   diclofenac Sodium  2 g Topical QID   DULoxetine  30 mg Oral Daily   folic acid  2 mg Oral Daily   guaiFENesin-dextromethorphan  10 mL Oral Q12H   insulin aspart  0-9 Units Subcutaneous TID WC   insulin aspart  5 Units Subcutaneous TID WC   insulin glargine-yfgn  15 Units Subcutaneous QHS   linaclotide  145 mcg Oral QAC breakfast   pantoprazole  40 mg Oral Daily   predniSONE  10 mg Oral BID WC   senna  2 tablet Oral BID    sodium chloride flush  10-40 mL Intracatheter Q12H   vitamin B-12  1,000 mcg Oral Daily   Continuous Infusions:     LOS: 25 days   Time spent= 35 mins    Jader Desai Joline Maxcy, MD [facility]  If 7PM-7AM, please contact night-coverage  05/12/2022, 11:44 AM

## 2022-05-12 NOTE — TOC Progression Note (Signed)
Transition of Care [facility]) - Progression Note    Patient Details  Name: [name] MRN: [id number] Date of Birth: Jul 07, 1966  Transition of Care [facility]) CM/SW Contact  Baldemar Lenis, [place] Phone Number: 05/12/2022, 3:28 PM  Clinical Narrative:   CSW spoke with PT and OT earlier today about spouse's interest in AIR, but patient continues to be recommended for SNF. CSW spoke with Hessie Diener over the phone about SNF recommendation, and he indicated understanding. Hessie Diener interested in [facility] for SNF as first choice, and [facility] as second. CSW sent referral to [facility] to see about bed availability and ability to accept patient's insurance, but Admissions liaison is out of office today, will return on Monday. CSW updated Hessie Diener via phone that response from [place] will not happen until Monday. CSW to follow.    Expected Discharge Plan: Skilled Nursing Facility Barriers to Discharge: English as a second language teacher, SNF Pending bed offer, Continued Medical Work up  Expected Discharge Plan and Services Expected Discharge Plan: Skilled Nursing Facility In-house Referral: Clinical Social Work Discharge Planning Services: CM Consult Post Acute Care Choice: Skilled Nursing Facility Living arrangements for the past 2 months: Single Family Home                                       Social Determinants of Health (SDOH) Interventions    Readmission Risk Interventions    04/18/2022    4:11 PM 04/05/2022    2:33 PM 03/27/2022   10:12 AM  Readmission Risk Prevention Plan  Transportation Screening Complete Complete Complete  Medication Review Oceanographer) Complete Complete Complete  PCP or Specialist appointment within 3-5 days of discharge Complete Complete   HRI or Home Care Consult Complete Complete Complete  SW Recovery Care/Counseling Consult Complete Complete Complete  Palliative Care Screening Not Applicable Not Applicable Not Applicable  Skilled Nursing  Facility Not Applicable Not Applicable Not Applicable

## 2022-05-12 NOTE — Progress Notes (Signed)
Physical Therapy Treatment Patient Details Name: [name] MRN: [id number] DOB: 18-Apr-1966 Today's Date: 05/12/2022   History of Present Illness Pt is a 56 y.o. female admitted 6/26 after being found by her husband on the floor at home with AMS. Admitted with dx of acute metabolic encephalopathy. Incidental COVID+ on admission. Initially admitted to [facility] and transferred to [facility] 7/1 for initiation of HD. Multiple recent hospitalizations, 4/3-5/4 and 6/4-6/14. PMH: PE, Wegener's disease, CKD 3, DM, HTN, chronic pain with an intrathecal pump, OSA, bilat mid-foot amputations, peripheral neuropathy    PT Comments    Patient progressing slowly towards PT goals. Session focused on standing trials using back of chair for support. Requires Max-total A of 2 to clear bottom from bed with heavy reliance on UEs. Able to stand for a few seconds leaning on chair for support and with Max A of 2. Limited mainly by pain in right hip/LE from fall. Encouraged AROM/exercise of RLE to improve stiffness/soreness and hematoma. Better able to get to EOB today with less assist. Continue to recommend SNF to maximize independence and mobility. Will follow.    Recommendations for follow up therapy are one component of a multi-disciplinary discharge planning process, led by the attending physician.  Recommendations may be updated based on patient status, additional functional criteria and insurance authorization.  Follow Up Recommendations  Skilled nursing-short term rehab (<3 hours/day) Can patient physically be transported by private vehicle: No   Assistance Recommended at Discharge Frequent or constant Supervision/Assistance  Patient can return home with the following Two people to help with walking and/or transfers;Assistance with cooking/housework;Assist for transportation;Two people to help with bathing/dressing/bathroom;Help with stairs or ramp for entrance   Equipment Recommendations  Other (comment) (defer  to next venue)    Recommendations for Other Services       Precautions / Restrictions Precautions Precautions: Fall Precaution Comments: h/o bilateral transmet amputations, central line Restrictions Weight Bearing Restrictions: No     Mobility  Bed Mobility Overal bed mobility: Needs Assistance Bed Mobility: Supine to Sit, Sit to Supine     Supine to sit: Min assist, HOB elevated Sit to supine: Min assist, HOB elevated   General bed mobility comments: Asist with RLE to get to EOB, use of rail.    Transfers Overall transfer level: Needs assistance Equipment used:  (back of recliner) Transfers: Sit to/from Stand Sit to Stand: Max assist, +2 safety/equipment, +2 physical assistance, Total assist           General transfer comment: Declined use of stedy, total A of 2 on first attempt to stand from EOB using back of recliner to pull up, unable to clear bottom progressing to max A of 2 to stand on second attempt with cues for hip/knee extension, Heavy reliance on UEs on chair.    Ambulation/Gait               General Gait Details: Unable   Social research officer, government Rankin (Stroke Patients Only)       Balance Overall balance assessment: Needs assistance Sitting-balance support: No upper extremity supported, Feet supported Sitting balance-Leahy Scale: Good Sitting balance - Comments: Able to brush teeth sitting EOB   Standing balance support: During functional activity, Bilateral upper extremity supported Standing balance-Leahy Scale: Poor Standing balance comment: relies on BUE and external support, Max A of 2.  Cognition Arousal/Alertness: Awake/alert Behavior During Therapy: Flat affect Overall Cognitive Status: Within Functional Limits for tasks assessed                                 General Comments: some slow processing, but oriented and follows commands.   appears WFL.        Exercises General Exercises - Lower Extremity Long Arc Quad: Strengthening, Both, 10 reps, Seated    General Comments General comments (skin integrity, edema, etc.): VSS on RA. Cousin present during session      Pertinent Vitals/Pain Pain Assessment Pain Assessment: Faces Faces Pain Scale: Hurts whole lot Pain Location: R hip/back Pain Descriptors / Indicators: Discomfort, Grimacing, Sore Pain Intervention(s): Monitored during session, Limited activity within patient's tolerance, Repositioned    Home Living                          Prior Function            PT Goals (current goals can now be found in the care plan section) Acute Rehab PT Goals Patient Stated Goal: to get better, decrease pain PT Goal Formulation: With patient Time For Goal Achievement: 05/26/22 Potential to Achieve Goals: Good Progress towards PT goals: Progressing toward goals    Frequency    Min 3X/week      PT Plan Current plan remains appropriate    Co-evaluation PT/OT/SLP Co-Evaluation/Treatment: Yes Reason for Co-Treatment: For patient/therapist safety;To address functional/ADL transfers PT goals addressed during session: Mobility/safety with mobility;Balance;Strengthening/ROM OT goals addressed during session: ADL's and self-care      AM-PAC PT "6 Clicks" Mobility   Outcome Measure  Help needed turning from your back to your side while in a flat bed without using bedrails?: A Little Help needed moving from lying on your back to sitting on the side of a flat bed without using bedrails?: A Little Help needed moving to and from a bed to a chair (including a wheelchair)?: Total Help needed standing up from a chair using your arms (e.g., wheelchair or bedside chair)?: Total Help needed to walk in hospital room?: Total Help needed climbing 3-5 steps with a railing? : Total 6 Click Score: 10    End of Session Equipment Utilized During Treatment: Gait  belt Activity Tolerance: Patient limited by pain Patient left: in bed;with call bell/phone within reach;with bed alarm set;with nursing/sitter in room;with family/visitor present Nurse Communication: Mobility status;Need for lift equipment PT Visit Diagnosis: Muscle weakness (generalized) (M62.81);Other abnormalities of gait and mobility (R26.89);Pain Pain - Right/Left: Right Pain - part of body: Hip     Time: 1220-1250 PT Time Calculation (min) (ACUTE ONLY): 30 min  Charges:  $Therapeutic Activity: 8-22 mins                     Vale Haven, PT, DPT Acute Rehabilitation Services Secure chat preferred Office [phone]      Blake Divine A Theresa Dohrman 05/12/2022, 2:18 PM

## 2022-05-13 DIAGNOSIS — G9341 Metabolic encephalopathy: Secondary | ICD-10-CM | POA: Diagnosis not present

## 2022-05-13 LAB — BASIC METABOLIC PANEL
Anion gap: 9 (ref 5–15)
BUN: 41 mg/dL — ABNORMAL HIGH (ref 6–20)
CO2: 21 mmol/L — ABNORMAL LOW (ref 22–32)
Calcium: 8.3 mg/dL — ABNORMAL LOW (ref 8.9–10.3)
Chloride: 109 mmol/L (ref 98–111)
Creatinine, Ser: 1.53 mg/dL — ABNORMAL HIGH (ref 0.44–1.00)
GFR, Estimated: 40 mL/min — ABNORMAL LOW (ref >60)
Glucose, Bld: 92 mg/dL (ref 70–99)
Potassium: 4.2 mmol/L (ref 3.5–5.1)
Sodium: 139 mmol/L (ref 135–145)

## 2022-05-13 LAB — RENAL FUNCTION PANEL
Albumin: 2.8 g/dL — ABNORMAL LOW (ref 3.5–5.0)
Anion gap: 9 (ref 5–15)
BUN: 42 mg/dL — ABNORMAL HIGH (ref 6–20)
CO2: 21 mmol/L — ABNORMAL LOW (ref 22–32)
Calcium: 8.3 mg/dL — ABNORMAL LOW (ref 8.9–10.3)
Chloride: 109 mmol/L (ref 98–111)
Creatinine, Ser: 1.55 mg/dL — ABNORMAL HIGH (ref 0.44–1.00)
GFR, Estimated: 39 mL/min — ABNORMAL LOW (ref >60)
Glucose, Bld: 94 mg/dL (ref 70–99)
Phosphorus: 4 mg/dL (ref 2.5–4.6)
Potassium: 4.1 mmol/L (ref 3.5–5.1)
Sodium: 139 mmol/L (ref 135–145)

## 2022-05-13 LAB — CBC
HCT: 30.8 % — ABNORMAL LOW (ref 36.0–46.0)
Hemoglobin: 10.1 g/dL — ABNORMAL LOW (ref 12.0–15.0)
MCH: 27.9 pg (ref 26.0–34.0)
MCHC: 32.8 g/dL (ref 30.0–36.0)
MCV: 85.1 fL (ref 80.0–100.0)
Platelets: 224 10*3/uL (ref 150–400)
RBC: 3.62 MIL/uL — ABNORMAL LOW (ref 3.87–5.11)
RDW: 18.6 % — ABNORMAL HIGH (ref 11.5–15.5)
WBC: 10.3 10*3/uL (ref 4.0–10.5)
nRBC: 0 % (ref 0.0–0.2)

## 2022-05-13 LAB — GLUCOSE, CAPILLARY
Glucose-Capillary: 113 mg/dL — ABNORMAL HIGH (ref 70–99)
Glucose-Capillary: 94 mg/dL (ref 70–99)
Glucose-Capillary: 146 mg/dL — ABNORMAL HIGH (ref 70–99)
Glucose-Capillary: 315 mg/dL — ABNORMAL HIGH (ref 70–99)
Glucose-Capillary: 213 mg/dL — ABNORMAL HIGH (ref 70–99)

## 2022-05-13 LAB — ACID FAST CULTURE WITH REFLEXED SENSITIVITIES (MYCOBACTERIA): Acid Fast Culture: NEGATIVE

## 2022-05-13 LAB — MAGNESIUM: Magnesium: 1.5 mg/dL — ABNORMAL LOW (ref 1.7–2.4)

## 2022-05-13 MED ORDER — MAGNESIUM SULFATE 2 GM/50ML IV SOLN
2.0000 g | Freq: Once | INTRAVENOUS | Status: AC
Start: 2022-05-13 — End: 2022-05-13
  Administered 2022-05-13: 2 g via INTRAVENOUS
  Filled 2022-05-13: qty 50

## 2022-05-13 MED ORDER — GUAIFENESIN-DM 100-10 MG/5ML PO SYRP
10.0000 mL | ORAL_SOLUTION | ORAL | Status: DC | PRN
Start: 2022-05-13 — End: 2022-05-18
  Administered 2022-05-14 – 2022-05-17 (×5): 10 mL via ORAL
  Filled 2022-05-13 (×5): qty 10

## 2022-05-13 NOTE — Progress Notes (Signed)
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  She was recently admitted prior to this hospitalization for concerns of pneumonia, underwent bronchoscopy which showed Candida and was seen by infectious disease.  Admitted again on 6/26 with acute renal failure and concerns of UTI/pneumonia therefore started on broad-spectrum antibiotics.  Renal function continued to worsen and nephrology team was consulted.  She also had change in mental status and neurology was consulted who performed EEG which was unremarkable and LP was put on hold.  Nephrology eventually signed out as renal function stabilized.  Placed on heparin again but did not tolerate and had drop in hemoglobin again.  Started getting transfusion. Now off AC, her hb remains stable. Family working with [facility] for placement.   Assessment & Plan:   Acute blood loss anemia on anemia of chronic kidney disease Gluteal hematoma -There was some bleeding around HD catheter site and gluteal hematoma.   -Required multiple transfusion -Was cleared by Gen Surg for heparin Drip on 7/16 but patient had drop in hemoglobin again which was confirmed on CT abdomen pelvis with some extension of right gluteal hematoma. -Hb now remains stable. Should remain off AC atl least until 8/5, revaluate thereafter.    EGD in 2022-normal Colonoscopy-multiple diverticula, hemorrhoids with suspicion for fissure. Follows with [facility] heme-onc   AKI/ATN currently requiring HD -HD catheter is now out. Renal function seems to be improving, Will not need long term HD.  Received few sessions of dialysis but now renal function has stabilized, nephrology  signed off. - Nephrology sent off multiple biomarkers: *Of note C4, and Kappa light chains* were elevated minimally but otherwise ANCA, ANA, RF, anti-dsDNA, c3, spep/sflc without abnormalities, hepatitis panel, HIV, EBV, CMV were unremarkable   History of pulmonary embolism June 2023 -Bled again after rechallenging her with hep drip once cleared by Dr Freida Busman from Gen Surg. No longer on [facility], keep her off atleast until 8/5 then reevaluate. Patient received PRBC transfusion, repeat Doppler still negative for DVT.  Case discussed with IR who reviewed previous scans.  Patient is not a candidate for IVC filter.  Hypertension: stable on amlodipine  Mild hyperkalemia -Resolved  Hypermagnesemia: Replenished   Acute metabolic encephalopathy, POA, multifactorial. Improved.  -Resolved, work-up including MRI, EEG negative.  Neurology signed off 7/6   Chronic pain, neuropathy, and chronic opioid dependence  Right lower Ext and Low back pain -Pain control.  -MRI Lumbar spine shows some strain in her back muscles and gluteal hematoma/hemorrhage.   Granulomatosis with polyangiitis  -Follows outpatient now [facility].  Receiving rituximab and methotrexate. - Reduced prednisone to 10 mg twice daily, eventually transition to 5 mg.  Requesting outpatient referral for rheumatology upon discharge.  They would like to go to [facility].   Secondary adrenal insufficiency  -Continue steroids    Current chronic use of systemic steroids -At home prednisone 5 mg daily.  Currently on 10 BID   Acute hypoxic respiratory failure, POA -Resolved now on room air   Presumed severe sepsis secondary to pneumonia versus UTI -Completed 5 days of antibiotic therapy    GERD  -Stable on PPI    COVID-19 virus infection, questionably incidental -Incidental positive finding at the time of admission.  Remdesivir held  due to elevated LFTs.  Completed isolation..   Diabetes mellitus type II, insulin-dependent with  hyperglycemia -A1c 8.6.  Accu-Chek sliding scale - cont Semglee to 15 units and NovoLog Premeal to 5 units 3 times daily AC   Obesity  With BMI of 35: Diet modification, exercise recommended    PT/OT = SNF TOC aware  DVT prophylaxis: SCD Code Status: full code  Family Communication: None present at bedside.  Plan of care discussed with patient in length and she verbalized understanding and agreed with it. Disposition Plan: SNF  Consultants:  General surgery IR   Status is: Inpatient   Subjective: Patient seen and examined.  Resting comfortably on the bed.  No new complaints.  No acute events overnight. Objective: Vitals:   05/12/22 1609 05/12/22 2000 05/13/22 0631 05/13/22 0829  BP: (!) 144/87 (!) 145/90 (!) 146/85 (!) 155/89  Pulse: 100 95  84  Resp: 19 20 16 18   Temp: 99 F (37.2 C) 98.5 F (36.9 C) 98 F (36.7 C)   TempSrc: Oral Oral Oral   SpO2: 94% 96% 94%   Weight:      Height:        Intake/Output Summary (Last 24 hours) at 05/13/2022 0956 Last data filed at 05/13/2022 05/15/2022 Gross per 24 hour  Intake --  Output 3200 ml  Net -3200 ml   Filed Weights   05/05/22 1233 05/05/22 1654 05/09/22 0326  Weight: 95.2 kg 95.2 kg 96.1 kg    Examination:  General exam: Appears calm and comfortable, on room air, communicating well, obese Respiratory system: Clear to auscultation. Respiratory effort normal. Cardiovascular system: S1 & S2 heard, RRR. No JVD, murmurs, rubs, gallops or clicks. No pedal edema. Gastrointestinal system: Abdomen is nondistended, soft and nontender. No organomegaly or masses felt. Normal bowel sounds heard. Central nervous system: Alert and oriented. No focal neurological deficits. Extremities: Bilateral toe amputations noted.. Skin: No rashes, lesions or ulcers Psychiatry: Judgement and insight appear normal. Mood & affect appropriate.    Data Reviewed: I have personally reviewed following labs and imaging studies  CBC: Recent Labs   Lab 05/09/22 0625 05/10/22 0630 05/10/22 2048 05/11/22 0410 05/11/22 2156 05/12/22 0605 05/13/22 0812  WBC 16.9* 17.2*  --  11.2*  --  11.0* 10.3  HGB 7.9* 6.8* 7.3* 7.6* 8.9* 9.6* 10.1*  HCT 25.0* 20.5* 22.3* 23.5* 27.8* 29.3* 30.8*  MCV 89.6 89.9  --  89.0  --  85.4 85.1  PLT 278 278  --  237  --  229 224   Basic Metabolic Panel: Recent Labs  Lab 05/08/22 0810 05/09/22 0625 05/10/22 0630 05/11/22 0410 05/12/22 0605 05/13/22 0812  NA 145  144 142 141 140 144 139  139  K 4.2  4.2 4.7 4.3 5.3* 4.8 4.2  4.1  CL 113*  113* 115* 113* 111 115* 109  109  CO2 23  22 22  21* 21* 22 21*  21*  GLUCOSE 143*  142* 229* 143* 348* 134* 92  94  BUN 66*  63* 64* 64* 64* 52* 41*  42*  CREATININE 2.63*  2.58* 2.16* 1.86* 1.87* 1.52* 1.53*  1.55*  CALCIUM 8.3*  8.2* 8.5* 8.8* 8.7* 8.7* 8.3*  8.3*  MG 1.5* 1.9 1.6* 2.0 1.5* 1.5*  PHOS 4.1  --  5.0* 5.2*  5.2* 3.5 4.0   GFR: Estimated Creatinine Clearance: 46.4 mL/min (A) (by C-G formula based on SCr of 1.55 mg/dL (H)). Liver Function Tests: Recent Labs  Lab 05/07/22 0455 05/08/22  1610 05/09/22 9604 05/10/22 0630 05/11/22 0410 05/12/22 0605 05/13/22 0812  AST 19 15 16   --   --   --   --   ALT 22 21 19   --   --   --   --   ALKPHOS 81 73 88  --   --   --   --   BILITOT 1.0 1.0 1.2  --   --   --   --   PROT 4.8* 5.1* 5.0*  --   --   --   --   ALBUMIN 2.6* 2.8*  2.8* 2.9* 2.9* 2.7* 2.8* 2.8*   No results for input(s): "LIPASE", "AMYLASE" in the last 168 hours. No results for input(s): "AMMONIA" in the last 168 hours. Coagulation Profile: No results for input(s): "INR", "PROTIME" in the last 168 hours. Cardiac Enzymes: No results for input(s): "CKTOTAL", "CKMB", "CKMBINDEX", "TROPONINI" in the last 168 hours. BNP (last 3 results) No results for input(s): "PROBNP" in the last 8760 hours. HbA1C: No results for input(s): "HGBA1C" in the last 72 hours. CBG: Recent Labs  Lab 05/12/22 1246 05/12/22 1610  05/12/22 2019 05/13/22 0632 05/13/22 0824  GLUCAP 84 203* 145* 113* 94   Lipid Profile: No results for input(s): "CHOL", "HDL", "LDLCALC", "TRIG", "CHOLHDL", "LDLDIRECT" in the last 72 hours. Thyroid Function Tests: No results for input(s): "TSH", "T4TOTAL", "FREET4", "T3FREE", "THYROIDAB" in the last 72 hours. Anemia Panel: No results for input(s): "VITAMINB12", "FOLATE", "FERRITIN", "TIBC", "IRON", "RETICCTPCT" in the last 72 hours. Sepsis Labs: No results for input(s): "PROCALCITON", "LATICACIDVEN" in the last 168 hours.  No results found for this or any previous visit (from the past 240 hour(s)).    Radiology Studies: No results found.  Scheduled Meds:  amLODipine  10 mg Oral Daily   diclofenac Sodium  2 g Topical QID   DULoxetine  30 mg Oral Daily   folic acid  2 mg Oral Daily   guaiFENesin-dextromethorphan  10 mL Oral Q12H   insulin aspart  0-9 Units Subcutaneous TID WC   insulin aspart  5 Units Subcutaneous TID WC   insulin glargine-yfgn  15 Units Subcutaneous QHS   linaclotide  145 mcg Oral QAC breakfast   pantoprazole  40 mg Oral Daily   predniSONE  10 mg Oral BID WC   senna  2 tablet Oral BID   sodium chloride flush  10-40 mL Intracatheter Q12H   vitamin B-12  1,000 mcg Oral Daily   Continuous Infusions:   LOS: 26 days   Time spent: 35 minutes   Sunya Humbarger 05/15/22, MD [facility]  If 7PM-7AM, please contact night-coverage [email or web address] 05/13/2022, 9:56 AM

## 2022-05-13 NOTE — Plan of Care (Signed)

## 2022-05-14 ENCOUNTER — Inpatient Hospital Stay (HOSPITAL_COMMUNITY): Payer: BC Managed Care – PPO

## 2022-05-14 DIAGNOSIS — G9341 Metabolic encephalopathy: Secondary | ICD-10-CM | POA: Diagnosis not present

## 2022-05-14 LAB — GLUCOSE, CAPILLARY
Glucose-Capillary: 173 mg/dL — ABNORMAL HIGH (ref 70–99)
Glucose-Capillary: 137 mg/dL — ABNORMAL HIGH (ref 70–99)
Glucose-Capillary: 238 mg/dL — ABNORMAL HIGH (ref 70–99)
Glucose-Capillary: 220 mg/dL — ABNORMAL HIGH (ref 70–99)
Glucose-Capillary: 202 mg/dL — ABNORMAL HIGH (ref 70–99)

## 2022-05-14 LAB — CBC
HCT: 29.7 % — ABNORMAL LOW (ref 36.0–46.0)
Hemoglobin: 9.8 g/dL — ABNORMAL LOW (ref 12.0–15.0)
MCH: 28.1 pg (ref 26.0–34.0)
MCHC: 33 g/dL (ref 30.0–36.0)
MCV: 85.1 fL (ref 80.0–100.0)
Platelets: 222 10*3/uL (ref 150–400)
RBC: 3.49 MIL/uL — ABNORMAL LOW (ref 3.87–5.11)
RDW: 17.8 % — ABNORMAL HIGH (ref 11.5–15.5)
WBC: 9.6 10*3/uL (ref 4.0–10.5)
nRBC: 0 % (ref 0.0–0.2)

## 2022-05-14 LAB — RENAL FUNCTION PANEL
Albumin: 2.6 g/dL — ABNORMAL LOW (ref 3.5–5.0)
Anion gap: 8 (ref 5–15)
BUN: 40 mg/dL — ABNORMAL HIGH (ref 6–20)
CO2: 21 mmol/L — ABNORMAL LOW (ref 22–32)
Calcium: 8.2 mg/dL — ABNORMAL LOW (ref 8.9–10.3)
Chloride: 110 mmol/L (ref 98–111)
Creatinine, Ser: 1.59 mg/dL — ABNORMAL HIGH (ref 0.44–1.00)
GFR, Estimated: 38 mL/min — ABNORMAL LOW (ref >60)
Glucose, Bld: 223 mg/dL — ABNORMAL HIGH (ref 70–99)
Phosphorus: 4 mg/dL (ref 2.5–4.6)
Potassium: 4.5 mmol/L (ref 3.5–5.1)
Sodium: 139 mmol/L (ref 135–145)

## 2022-05-14 LAB — MAGNESIUM: Magnesium: 1.9 mg/dL (ref 1.7–2.4)

## 2022-05-14 MED ORDER — AMOXICILLIN-POT CLAVULANATE 875-125 MG PO TABS
1.0000 | ORAL_TABLET | Freq: Two times a day (BID) | ORAL | Status: DC
Start: 2022-05-14 — End: 2022-05-18
  Administered 2022-05-14 – 2022-05-18 (×9): 1 via ORAL
  Filled 2022-05-14 (×9): qty 1

## 2022-05-14 MED ORDER — DIPHENHYDRAMINE HCL 50 MG/ML IJ SOLN
25.0000 mg | Freq: Once | INTRAMUSCULAR | Status: AC
  Administered 2022-05-14: 25 mg via INTRAVENOUS
  Filled 2022-05-14: qty 1

## 2022-05-14 NOTE — Plan of Care (Signed)

## 2022-05-14 NOTE — Progress Notes (Addendum)
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  She was recently admitted prior to this hospitalization for concerns of pneumonia, underwent bronchoscopy which showed Candida and was seen by infectious disease.  Admitted again on 6/26 with acute renal failure and concerns of UTI/pneumonia therefore started on broad-spectrum antibiotics.  Renal function continued to worsen and nephrology team was consulted.  She also had change in mental status and neurology was consulted who performed EEG which was unremarkable and LP was put on hold.  Nephrology eventually signed out as renal function stabilized.  Placed on heparin again but did not tolerate and had drop in hemoglobin again.  Started getting transfusion. Now off AC, her hb remains stable. Family working with [facility] for placement.   Assessment & Plan:   Acute blood loss anemia on anemia of chronic kidney disease Gluteal hematoma -There was some bleeding around HD catheter site and gluteal hematoma.   -Required multiple transfusion -Was cleared by Gen Surg for heparin Drip on 7/16 but patient had drop in hemoglobin again which was confirmed on CT abdomen pelvis with some extension of right gluteal hematoma. -Hb now remains stable. Should remain off AC atl least until 8/5, revaluate thereafter.    EGD in 2022-normal Colonoscopy-multiple diverticula, hemorrhoids with suspicion for fissure. Follows with [facility]   AKI/ATN currently requiring HD -HD catheter is now out. Received few sessions of dialysis but now renal function has stabilized, nephrology signed off. - Nephrology sent off multiple biomarkers: *Of note C4,  and Kappa light chains* were elevated minimally but otherwise ANCA, ANA, RF, anti-dsDNA, c3, spep/sflc without abnormalities, hepatitis panel, HIV, EBV, CMV were unremarkable -Continue to monitor renal function closely.  Creatinine slightly trended up to 1.5 this morning.    History of pulmonary embolism June 2023 -Bled again after rechallenging her with hep drip once cleared by Dr Freida Busman from Gen Surg. No longer on AC, keep her off atleast until 8/5 then reevaluate.  -Patient received PRBC transfusion, repeat Doppler still negative for DVT.  Case discussed with IR who reviewed previous scans.  Patient is not a candidate for IVC filter.  Right upper lobe pneumonia: -Patient reports worsening of cough and shortness of breath with some blood in her sputum. -X-ray obtained which was concerning for pneumonia. -Currently on room air.  Start Augmentin  Hypertension: stable on amlodipine  Mild hyperkalemia -Resolved  Hypomagnesemia: Replenished   Acute metabolic encephalopathy, POA, multifactorial. Improved.  -Resolved, work-up including MRI, EEG negative.  Neurology signed off 7/6   Chronic pain, neuropathy, and chronic opioid dependence  Right lower Ext and Low back pain -Pain control.  -MRI Lumbar spine shows some strain in her back muscles and gluteal hematoma/hemorrhage.   Granulomatosis with polyangiitis  -Follows outpatient now [facility].  Receiving rituximab and methotrexate. -Reduced prednisone to 10 mg twice daily, eventually transition to 5 mg.  Requesting outpatient referral for rheumatology upon discharge.  They would like to go to [facility].   Secondary adrenal insufficiency  -Continue steroids    Current chronic use of systemic steroids -At home prednisone 5 mg daily.  Currently on 10 BID   Acute hypoxic respiratory failure, POA -Resolved now on room air   Presumed severe sepsis secondary  to pneumonia versus UTI -Completed 5 days of antibiotic therapy    GERD  -Stable  on PPI    COVID-19 virus infection, questionably incidental -Incidental positive finding at the time of admission.  Remdesivir held due to elevated LFTs.  Completed isolation..   Diabetes mellitus type II, insulin-dependent with hyperglycemia -A1c 8.6.  Accu-Chek sliding scale - cont Semglee to 15 units and NovoLog Premeal to 5 units 3 times daily AC   Obesity  With BMI of 35: Diet modification, exercise recommended   Disposition: PT/OT recommended SNF.  TOC aware.  DVT prophylaxis: SCD Code Status: full code  Family Communication: None present at bedside.  Plan of care discussed with patient in length and she verbalized understanding and agreed with it. Disposition Plan: SNF  Consultants:  General surgery IR   Status is: Inpatient   Subjective: Seen and examined.  Sitting comfortably on the bed.  Reports that she noticed some blood with thick phlegm earlier this morning and feels like her breathing is getting worse.  She has a history of granulomatosis with polyangiitis and on chronic prednisone.  No fever, chills, chest pain, palpitations, leg edema.  No acute events overnight. Objective: Vitals:   05/13/22 2101 05/14/22 0018 05/14/22 0600 05/14/22 0841  BP:  (!) 144/88  (!) 154/97  Pulse:  97  96  Resp: 20 19  12   Temp:  98.6 F (37 C) 98.1 F (36.7 C) 98.5 F (36.9 C)  TempSrc:  Oral Oral Oral  SpO2:  95%  96%  Weight:      Height:        Intake/Output Summary (Last 24 hours) at 05/14/2022 1004 Last data filed at 05/14/2022 0243 Gross per 24 hour  Intake --  Output 1200 ml  Net -1200 ml    Filed Weights   05/05/22 1233 05/05/22 1654 05/09/22 0326  Weight: 95.2 kg 95.2 kg 96.1 kg    Examination:  General exam: Appears calm and comfortable, on room air, communicating well, obese Respiratory system: Clear to auscultation. Respiratory effort normal. Cardiovascular system: S1 & S2 heard, RRR. No JVD, murmurs, rubs, gallops or clicks. No pedal  edema. Gastrointestinal system: Abdomen is nondistended, soft and nontender. No organomegaly or masses felt. Normal bowel sounds heard. Central nervous system: Alert and oriented. No focal neurological deficits. Extremities: Bilateral toe amputations noted.. Skin: No rashes, lesions or ulcers Psychiatry: Judgement and insight appear normal. Mood & affect appropriate.    Data Reviewed: I have personally reviewed following labs and imaging studies  CBC: Recent Labs  Lab 05/10/22 0630 05/10/22 2048 05/11/22 0410 05/11/22 2156 05/12/22 0605 05/13/22 0812 05/14/22 0356  WBC 17.2*  --  11.2*  --  11.0* 10.3 9.6  HGB 6.8*   < > 7.6* 8.9* 9.6* 10.1* 9.8*  HCT 20.5*   < > 23.5* 27.8* 29.3* 30.8* 29.7*  MCV 89.9  --  89.0  --  85.4 85.1 85.1  PLT 278  --  237  --  229 224 222   < > = values in this interval not displayed.    Basic Metabolic Panel: Recent Labs  Lab 05/10/22 0630 05/11/22 0410 05/12/22 0605 05/13/22 0812 05/14/22 0356  NA 141 140 144 139  139 139  K 4.3 5.3* 4.8 4.2  4.1 4.5  CL 113* 111 115* 109  109 110  CO2 21* 21* 22 21*  21* 21*  GLUCOSE 143* 348* 134* 92  94 223*  BUN 64* 64* 52* 41*  42* 40*  CREATININE 1.86* 1.87* 1.52* 1.53*  1.55* 1.59*  CALCIUM 8.8* 8.7* 8.7* 8.3*  8.3* 8.2*  MG 1.6* 2.0 1.5* 1.5* 1.9  PHOS 5.0* 5.2*  5.2* 3.5 4.0 4.0    GFR: Estimated Creatinine Clearance: 45.3 mL/min (A) (by C-G formula based on SCr of 1.59 mg/dL (H)). Liver Function Tests: Recent Labs  Lab 05/08/22 0810 05/09/22 0625 05/10/22 0630 05/11/22 0410 05/12/22 0605 05/13/22 0812 05/14/22 0356  AST 15 16  --   --   --   --   --   ALT 21 19  --   --   --   --   --   ALKPHOS 73 88  --   --   --   --   --   BILITOT 1.0 1.2  --   --   --   --   --   PROT 5.1* 5.0*  --   --   --   --   --   ALBUMIN 2.8*  2.8* 2.9* 2.9* 2.7* 2.8* 2.8* 2.6*    No results for input(s): "LIPASE", "AMYLASE" in the last 168 hours. No results for input(s): "AMMONIA" in the  last 168 hours. Coagulation Profile: No results for input(s): "INR", "PROTIME" in the last 168 hours. Cardiac Enzymes: No results for input(s): "CKTOTAL", "CKMB", "CKMBINDEX", "TROPONINI" in the last 168 hours. BNP (last 3 results) No results for input(s): "PROBNP" in the last 8760 hours. HbA1C: No results for input(s): "HGBA1C" in the last 72 hours. CBG: Recent Labs  Lab 05/13/22 1209 05/13/22 1734 05/13/22 2152 05/14/22 0709 05/14/22 0855  GLUCAP 146* 315* 213* 173* 137*    Lipid Profile: No results for input(s): "CHOL", "HDL", "LDLCALC", "TRIG", "CHOLHDL", "LDLDIRECT" in the last 72 hours. Thyroid Function Tests: No results for input(s): "TSH", "T4TOTAL", "FREET4", "T3FREE", "THYROIDAB" in the last 72 hours. Anemia Panel: No results for input(s): "VITAMINB12", "FOLATE", "FERRITIN", "TIBC", "IRON", "RETICCTPCT" in the last 72 hours. Sepsis Labs: No results for input(s): "PROCALCITON", "LATICACIDVEN" in the last 168 hours.  No results found for this or any previous visit (from the past 240 hour(s)).    Radiology Studies: No results found.  Scheduled Meds:  amLODipine  10 mg Oral Daily   diclofenac Sodium  2 g Topical QID   DULoxetine  30 mg Oral Daily   folic acid  2 mg Oral Daily   insulin aspart  0-9 Units Subcutaneous TID WC   insulin aspart  5 Units Subcutaneous TID WC   insulin glargine-yfgn  15 Units Subcutaneous QHS   linaclotide  145 mcg Oral QAC breakfast   pantoprazole  40 mg Oral Daily   predniSONE  10 mg Oral BID WC   senna  2 tablet Oral BID   sodium chloride flush  10-40 mL Intracatheter Q12H   vitamin B-12  1,000 mcg Oral Daily   Continuous Infusions:   LOS: 27 days   Time spent: 35 minutes   [name] Estill Cotta, MD [facility]  If 7PM-7AM, please contact night-coverage [email or web address] 05/14/2022, 10:04 AM

## 2022-05-15 ENCOUNTER — Other Ambulatory Visit: Payer: Self-pay

## 2022-05-15 ENCOUNTER — Ambulatory Visit: Payer: BC Managed Care – PPO | Admitting: Internal Medicine

## 2022-05-15 ENCOUNTER — Telehealth: Payer: Self-pay | Admitting: Family Medicine

## 2022-05-15 DIAGNOSIS — N1831 Chronic kidney disease, stage 3a: Secondary | ICD-10-CM

## 2022-05-15 DIAGNOSIS — N3000 Acute cystitis without hematuria: Secondary | ICD-10-CM | POA: Diagnosis not present

## 2022-05-15 DIAGNOSIS — G9341 Metabolic encephalopathy: Secondary | ICD-10-CM | POA: Diagnosis not present

## 2022-05-15 DIAGNOSIS — G8929 Other chronic pain: Secondary | ICD-10-CM

## 2022-05-15 DIAGNOSIS — N179 Acute kidney failure, unspecified: Secondary | ICD-10-CM | POA: Diagnosis not present

## 2022-05-15 LAB — CBC
HCT: 31.9 % — ABNORMAL LOW (ref 36.0–46.0)
Hemoglobin: 10.5 g/dL — ABNORMAL LOW (ref 12.0–15.0)
MCH: 28.1 pg (ref 26.0–34.0)
MCHC: 32.9 g/dL (ref 30.0–36.0)
MCV: 85.3 fL (ref 80.0–100.0)
Platelets: 233 10*3/uL (ref 150–400)
RBC: 3.74 MIL/uL — ABNORMAL LOW (ref 3.87–5.11)
RDW: 17.3 % — ABNORMAL HIGH (ref 11.5–15.5)
WBC: 9.2 10*3/uL (ref 4.0–10.5)
nRBC: 0 % (ref 0.0–0.2)

## 2022-05-15 LAB — GLUCOSE, CAPILLARY
Glucose-Capillary: 169 mg/dL — ABNORMAL HIGH (ref 70–99)
Glucose-Capillary: 172 mg/dL — ABNORMAL HIGH (ref 70–99)
Glucose-Capillary: 313 mg/dL — ABNORMAL HIGH (ref 70–99)
Glucose-Capillary: 177 mg/dL — ABNORMAL HIGH (ref 70–99)

## 2022-05-15 LAB — RENAL FUNCTION PANEL
Albumin: 2.7 g/dL — ABNORMAL LOW (ref 3.5–5.0)
Anion gap: 8 (ref 5–15)
BUN: 39 mg/dL — ABNORMAL HIGH (ref 6–20)
CO2: 22 mmol/L (ref 22–32)
Calcium: 8.5 mg/dL — ABNORMAL LOW (ref 8.9–10.3)
Chloride: 111 mmol/L (ref 98–111)
Creatinine, Ser: 1.38 mg/dL — ABNORMAL HIGH (ref 0.44–1.00)
GFR, Estimated: 45 mL/min — ABNORMAL LOW (ref >60)
Glucose, Bld: 175 mg/dL — ABNORMAL HIGH (ref 70–99)
Phosphorus: 4.1 mg/dL (ref 2.5–4.6)
Potassium: 4.6 mmol/L (ref 3.5–5.1)
Sodium: 141 mmol/L (ref 135–145)

## 2022-05-15 LAB — MAGNESIUM: Magnesium: 1.5 mg/dL — ABNORMAL LOW (ref 1.7–2.4)

## 2022-05-15 MED ORDER — HYDROCODONE BIT-HOMATROP MBR 5-1.5 MG/5ML PO SOLN
5.0000 mL | Freq: Every evening | ORAL | Status: DC | PRN
  Administered 2022-05-15 – 2022-05-16 (×2): 5 mL via ORAL
  Filled 2022-05-15 (×2): qty 5

## 2022-05-15 MED ORDER — PREDNISONE 5 MG PO TABS
10.0000 mg | ORAL_TABLET | Freq: Every day | ORAL | Status: DC
  Administered 2022-05-16 – 2022-05-18 (×3): 10 mg via ORAL
  Filled 2022-05-15 (×3): qty 2

## 2022-05-15 MED ORDER — DIPHENHYDRAMINE HCL 50 MG/ML IJ SOLN
25.0000 mg | Freq: Once | INTRAMUSCULAR | Status: AC
Start: 2022-05-15 — End: 2022-05-15
  Administered 2022-05-15: 25 mg via INTRAVENOUS
  Filled 2022-05-15: qty 1

## 2022-05-15 NOTE — Progress Notes (Signed)
Physical Therapy Treatment Patient Details Name: [name] MRN: [id number] DOB: 03/22/1966 Today's Date: 05/15/2022   History of Present Illness Pt is a 56 y.o. female admitted 6/26 after being found by her husband on the floor at home with AMS. Admitted with dx of acute metabolic encephalopathy. Incidental COVID+ on admission. Initially admitted to [facility] and transferred to [facility] 7/1 for initiation of HD. Multiple recent hospitalizations, 4/3-5/4 and 6/4-6/14. PMH: PE, Wegener's disease, CKD 3, DM, HTN, chronic pain with an intrathecal pump, OSA, bilat mid-foot amputations, peripheral neuropathy    PT Comments    Pt progressing well towards all goals however remains to have 9/10 R hip and back pain that is limiting therapy tolerance. Pt was able to complete 5 sit to stands today in stedy with mod/maxAX2, standing tolerance limited by R LE pain. Pt continues to benefit from therapy both acutely and post d/c to progress towards safe mod I level of function. Acute PT to cont to follow.    Recommendations for follow up therapy are one component of a multi-disciplinary discharge planning process, led by the attending physician.  Recommendations may be updated based on patient status, additional functional criteria and insurance authorization.  Follow Up Recommendations  Skilled nursing-short term rehab (<3 hours/day) Can patient physically be transported by private vehicle: No   Assistance Recommended at Discharge Frequent or constant Supervision/Assistance  Patient can return home with the following Two people to help with walking and/or transfers;Assistance with cooking/housework;Assist for transportation;Two people to help with bathing/dressing/bathroom;Help with stairs or ramp for entrance   Equipment Recommendations  Other (comment) (defer to next venue)    Recommendations for Other Services       Precautions / Restrictions Precautions Precautions: Fall Precaution Comments: h/o  bilateral transmet amputations Restrictions Weight Bearing Restrictions: No     Mobility  Bed Mobility Overal bed mobility: Needs Assistance Bed Mobility: Rolling, Sidelying to Sit Rolling: Min assist Sidelying to sit: Min assist, Mod assist, HOB elevated       General bed mobility comments: with verbal cues pt with initiative to roll, using body momentum, step by step cues provided, modA for trunk elevation to EOB    Transfers Overall transfer level: Needs assistance Equipment used:  (back of recliner anf stedy) Transfers: Sit to/from Stand Sit to Stand: Max assist, +2 physical assistance           General transfer comment: Pt completed 5 sit to stand today, First one pulling up on back of recliner requiring maxAx2 to power up and unable to achieve full upright posture, pt laying on back of recliner, the other 4 were in the stedy, pt modAx2 to power up from seat on stedy. Pt able to maintain upright standing in the stedy approx 30 sec prior to needing tactile and verbal cues to correct posture. worked on [employer] shifting L/R in stedy, pt reports "i can't really feel anything in the right leg", pt fatigues quickly and reports 9/10 R LE pain in standing limiting tolerance as well Transfer via Lift Equipment: Stedy  Ambulation/Gait               General Gait Details: unable due to weakness and poor standing trials with increased assistance.   Stairs             Wheelchair Mobility    Modified Rankin (Stroke Patients Only)       Balance Overall balance assessment: Needs assistance Sitting-balance support: Feet supported, Bilateral upper extremity supported Sitting balance-Leahy  Scale: Fair Sitting balance - Comments: pt requiring bilat UE majority of time today to maintain EOB balance, pt with pain in R hip with weightbearing in sitting EOB   Standing balance support: During functional activity, Bilateral upper extremity supported Standing balance-Leahy  Scale: Poor Standing balance comment: relies on BUE and external support, Max A of 2.                            Cognition Arousal/Alertness: Awake/alert Behavior During Therapy: Flat affect Overall Cognitive Status: Within Functional Limits for tasks assessed                                 General Comments: some slow processing, but oriented and follows commands, with encouragement pt pushes herself        Exercises General Exercises - Lower Extremity Ankle Circles/Pumps: AROM, Both, 10 reps, Supine Quad Sets: AROM, Both, Supine, 10 reps (with 5 sec hold) Long Arc Quad: Strengthening, Both, 10 reps, Seated Hip Flexion/Marching: Strengthening, Both, 10 reps, Seated    General Comments General comments (skin integrity, edema, etc.): VSS on RA, pt with increased pain in R LE limiting mobility tolerance      Pertinent Vitals/Pain Pain Assessment Pain Assessment: 0-10 Pain Score: 8  Pain Location: R hip/back Pain Descriptors / Indicators: Discomfort, Grimacing, Sore Pain Intervention(s): Monitored during session    Home Living                          Prior Function            PT Goals (current goals can now be found in the care plan section) Acute Rehab PT Goals Patient Stated Goal: decreased pain PT Goal Formulation: With patient Time For Goal Achievement: 05/26/22 Potential to Achieve Goals: Good Progress towards PT goals: Progressing toward goals    Frequency    Min 3X/week      PT Plan Current plan remains appropriate    Co-evaluation              AM-PAC PT "6 Clicks" Mobility   Outcome Measure  Help needed turning from your back to your side while in a flat bed without using bedrails?: A Little Help needed moving from lying on your back to sitting on the side of a flat bed without using bedrails?: A Little Help needed moving to and from a bed to a chair (including a wheelchair)?: Total Help needed standing  up from a chair using your arms (e.g., wheelchair or bedside chair)?: A Lot Help needed to walk in hospital room?: Total Help needed climbing 3-5 steps with a railing? : Total 6 Click Score: 11    End of Session Equipment Utilized During Treatment: Gait belt Activity Tolerance: Patient limited by pain Patient left: with call bell/phone within reach;in chair;with chair alarm set Nurse Communication: Mobility status;Need for lift equipment (use stedy to get back) PT Visit Diagnosis: Muscle weakness (generalized) (M62.81);Other abnormalities of gait and mobility (R26.89);Pain Pain - Right/Left: Right Pain - part of body: Hip     Time: 2956-2130 PT Time Calculation (min) (ACUTE ONLY): 29 min  Charges:  $Therapeutic Exercise: 8-22 mins $Therapeutic Activity: 8-22 mins                     Alayha Babineaux, PT, DPT Acute Rehabilitation Services Secure chat  preferred Office #: [phone]    Iona Hansen 05/15/2022, 11:33 AM

## 2022-05-15 NOTE — Telephone Encounter (Signed)
Spouse called in on patient behalf.  Patient is looking for rheumatology referral  [facility] Rheumatology Dr. Oretha Ellis [phone])  Patient has been in and out of hospital for various Kidney infection patient is currently admitted to hospital.   Spouse wants a call back in regard

## 2022-05-15 NOTE — TOC Progression Note (Signed)
Transition of Care [facility]) - Progression Note    Patient Details  Name: [name] MRN: [id number] Date of Birth: 1965/10/28  Transition of Care [facility]) CM/SW Contact  Baldemar Lenis, [place] Phone Number: 05/15/2022, 4:19 PM  Clinical Narrative:   CSW following for discharge planning for patient. CSW contacted [facility] to ask about referral, but they are unable to admit the patient due to her age; they do not take referrals on patients less than 57 years old. CSW also asked [facility] to review referral, as patient improved with PT session today, but [facility] feels that SNF recommendation is appropriate. CSW confirmed with [facility] that they would have a female bed become available for patient. CSW contacted spouse, [name], to provide update, and he is in agreement to move forward with [facility]. CSW requested that [facility] initiate insurance authorization.     Expected Discharge Plan: Skilled Nursing Facility Barriers to Discharge: Insurance Authorization, SNF Pending bed offer, Continued Medical Work up  Expected Discharge Plan and Services Expected Discharge Plan: Skilled Nursing Facility In-house Referral: Clinical Social Work Discharge Planning Services: CM Consult Post Acute Care Choice: Skilled Nursing Facility Living arrangements for the past 2 months: Single Family Home Expected Discharge Date: 05/15/22                                     Social Determinants of Health (SDOH) Interventions    Readmission Risk Interventions    04/18/2022    4:11 PM 04/05/2022    2:33 PM 03/27/2022   10:12 AM  Readmission Risk Prevention Plan  Transportation Screening Complete Complete Complete  Medication Review [employer]) Complete Complete Complete  PCP or Specialist appointment within 3-5 days of discharge Complete Complete   HRI or Home Care Consult Complete Complete Complete  SW Recovery Care/Counseling Consult Complete Complete Complete   Palliative Care Screening Not Applicable Not Applicable Not Applicable  Skilled Nursing Facility Not Applicable Not Applicable Not Applicable

## 2022-05-15 NOTE — Progress Notes (Signed)
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  She was recently admitted prior to this hospitalization for concerns of pneumonia, underwent bronchoscopy which showed Candida and was seen by infectious disease.  Admitted again on 6/26 with acute renal failure and concerns of UTI/pneumonia therefore started on broad-spectrum antibiotics.  Renal function continued to worsen and nephrology team was consulted.  She also had change in mental status and neurology was consulted who performed EEG which was unremarkable and LP was put on hold.  Nephrology eventually signed out as renal function stabilized.  Placed on heparin again but did not tolerate and had drop in hemoglobin again.  Started getting transfusion. Now off AC, her hb remains stable. Family working with [facility] for placement.   Assessment and Plan: Acute blood loss anemia on anemia of chronic kidney disease due to gluteal hematoma:   Hgb stabilized, hematoma clinically stable at this time s/p 8u PRBCs.  - Was cleared by Gen Surg for heparin Drip on 7/16 but patient had drop in hemoglobin again which was confirmed on CT abdomen pelvis with some extension of right gluteal hematoma. -Hb now remains stable. Should remain off AC atl least until 8/5, revaluate thereafter.    EGD in 2022-normal Colonoscopy-multiple diverticula, hemorrhoids with suspicion for fissure. Follows with [facility] heme-onc   AKI/ATN currently requiring HD -HD catheter is now out. Received few sessions of dialysis but now renal function has stabilized, nephrology signed off. - Nephrology sent off multiple biomarkers: *Of note C4, and Kappa light chains*  were elevated minimally but otherwise ANCA, ANA, RF, anti-dsDNA, c3, spep/sflc without abnormalities, hepatitis panel, HIV, EBV, CMV were unremarkable -Continue to monitor renal function closely.  Creatinine slightly trended up to 1.5 this morning.    History of pulmonary embolism June 2023 -Bled again after rechallenging her with hep drip once cleared by Dr Freida Busman from Gen Surg. No longer on AC. Will plan to keep her off at least until 8/5 then reevaluate.  -Patient received PRBC transfusion, repeat Doppler still negative for DVT.  Case discussed with IR who reviewed previous scans.  Patient is not a candidate for IVC filter.   Right upper lobe pneumonia: -Patient reports worsening of cough and shortness of breath with some blood in her sputum. -X-ray obtained which was concerning for pneumonia. - Continue augmentin x7 days given her immunocompromised state and will need repeat imaging 3-4 weeks after treatment to confirm resolution.  -Currently on room air    Hypertension: Stable.  - Continue amlodipine   Mild hyperkalemia: Resolved   Hypomagnesemia: Repleted   Acute metabolic encephalopathy, POA, multifactorial. Improved.  -Resolved, work-up including MRI, EEG negative.  Neurology signed off 7/6   Chronic pain, neuropathy, and chronic opioid dependence  Right lower Ext and Low back pain -Pain control.  -MRI Lumbar spine shows some strain in her back muscles and gluteal hematoma/hemorrhage. Continue pain control as ordered.   Granulomatosis with polyangiitis  - Followed by Rheumatology in [facility] system in [place], though pt/spouse are attempting to get referral to [facility] rheumatology. She's in rituximab, not due for dose at this time. Will plan to restart MTX on Sunday since this will conclude her abx for infection.  - Will reduce prednisone dosing further  to 10mg  daily in effort to taper back to home dose 5mg  daily    Secondary adrenal insufficiency  -Continue steroids    Current  chronic use of systemic steroids -At home prednisone 5 mg daily.  Currently on 10 BID   Acute hypoxic respiratory failure, POA -Resolved now on room air   Presumed severe sepsis secondary to pneumonia versus UTI -Completed 5 days of antibiotic therapy    GERD  -Stable on PPI    COVID-19 virus infection, questionably incidental -Incidental positive finding at the time of admission.  Remdesivir held due to elevated LFTs.  Completed isolation..   Diabetes mellitus type II, insulin-dependent with hyperglycemia -A1c 8.6.  Accu-Chek sliding scale - cont Semglee to 15 units and NovoLog Premeal to 5 units 3 times daily AC   Obesity  With BMI of 35: Diet modification, exercise recommended   Disposition: PT/OT recommended SNF.  TOC aware.  Obesity: Estimated body mass index is 35.26 kg/m as calculated from the following:   Height as of this encounter: 5\' 5"  (1.651 m).   Weight as of this encounter: 96.1 kg.   Subjective: cough remains persistent, no current chest pain or dyspnea. Using IS regularly. No further bleeding grossly, pain in right buttock/leg is stable.   Objective: Vitals:   05/15/22 0642 05/15/22 0900 05/15/22 1117 05/15/22 1130  BP: (!) 148/101 (!) 155/98 (!) 150/86   Pulse:  91 92   Resp: (!) 21 20 18    Temp:  98.3 F (36.8 C)  98.2 F (36.8 C)  TempSrc:  Oral    SpO2: 96% 96% 99%   Weight:      Height:       Gen: 56 y.o. female in no distress Pulm: Nonlabored breathing room air. No significant crackles or rhonchi or wheezes CV: Regular rate and rhythm. No murmur, rub, or gallop. No JVD, no significant dependent edema. GI: Abdomen soft, non-tender, non-distended, with normoactive bowel sounds.  Ext: Warm, Bilateral TMA without other deformities Skin: No rashes, lesions or ulcers on visualized skin. Right buttock has induration without ecchymosis or erythema that is tender. Neuro: Alert and oriented. No focal neurological deficits. Psych: Judgement and insight  appear fair. Mood euthymic & affect congruent. Behavior is appropriate.    Data Personally reviewed: CBC: Recent Labs  Lab 05/11/22 0410 05/11/22 2156 05/12/22 0605 05/13/22 0812 05/14/22 0356 05/15/22 0641  WBC 11.2*  --  11.0* 10.3 9.6 9.2  HGB 7.6* 8.9* 9.6* 10.1* 9.8* 10.5*  HCT 23.5* 27.8* 29.3* 30.8* 29.7* 31.9*  MCV 89.0  --  85.4 85.1 85.1 85.3  PLT 237  --  229 224 222 233   Basic Metabolic Panel: Recent Labs  Lab 05/11/22 0410 05/12/22 0605 05/13/22 0812 05/14/22 0356 05/15/22 0641  NA 140 144 139  139 139 141  K 5.3* 4.8 4.2  4.1 4.5 4.6  CL 111 115* 109  109 110 111  CO2 21* 22 21*  21* 21* 22  GLUCOSE 348* 134* 92  94 223* 175*  BUN 64* 52* 41*  42* 40* 39*  CREATININE 1.87* 1.52* 1.53*  1.55* 1.59* 1.38*  CALCIUM 8.7* 8.7* 8.3*  8.3* 8.2* 8.5*  MG 2.0 1.5* 1.5* 1.9 1.5*  PHOS 5.2*  5.2* 3.5 4.0 4.0 4.1   GFR: Estimated Creatinine Clearance: 52.2 mL/min (A) (by C-G formula based on SCr of 1.38 mg/dL (H)). Liver Function Tests: Recent Labs  Lab 05/09/22 05/15/22 05/10/22 0630 05/11/22 0410 05/12/22 1610 05/13/22 05/13/22 05/14/22 0356 05/15/22  5809  AST 16  --   --   --   --   --   --   ALT 19  --   --   --   --   --   --   ALKPHOS 88  --   --   --   --   --   --   BILITOT 1.2  --   --   --   --   --   --   PROT 5.0*  --   --   --   --   --   --   ALBUMIN 2.9*   < > 2.7* 2.8* 2.8* 2.6* 2.7*   < > = values in this interval not displayed.   No results for input(s): "LIPASE", "AMYLASE" in the last 168 hours. No results for input(s): "AMMONIA" in the last 168 hours. Coagulation Profile: No results for input(s): "INR", "PROTIME" in the last 168 hours. Cardiac Enzymes: No results for input(s): "CKTOTAL", "CKMB", "CKMBINDEX", "TROPONINI" in the last 168 hours. BNP (last 3 results) No results for input(s): "PROBNP" in the last 8760 hours. HbA1C: No results for input(s): "HGBA1C" in the last 72 hours. CBG: Recent Labs  Lab 05/14/22 1322  05/14/22 1729 05/14/22 2232 05/15/22 0550 05/15/22 1113  GLUCAP 238* 220* 202* 169* 172*   Lipid Profile: No results for input(s): "CHOL", "HDL", "LDLCALC", "TRIG", "CHOLHDL", "LDLDIRECT" in the last 72 hours. Thyroid Function Tests: No results for input(s): "TSH", "T4TOTAL", "FREET4", "T3FREE", "THYROIDAB" in the last 72 hours. Anemia Panel: No results for input(s): "VITAMINB12", "FOLATE", "FERRITIN", "TIBC", "IRON", "RETICCTPCT" in the last 72 hours. Urine analysis:    Component Value Date/Time   COLORURINE YELLOW 05/02/2022 1545   APPEARANCEUR HAZY (A) 05/02/2022 1545   LABSPEC 1.009 05/02/2022 1545   PHURINE 8.0 05/02/2022 1545   GLUCOSEU NEGATIVE 05/02/2022 1545   HGBUR SMALL (A) 05/02/2022 1545   BILIRUBINUR NEGATIVE 05/02/2022 1545   KETONESUR NEGATIVE 05/02/2022 1545   PROTEINUR 30 (A) 05/02/2022 1545   UROBILINOGEN 0.2 05/30/2013 1112   NITRITE NEGATIVE 05/02/2022 1545   LEUKOCYTESUR LARGE (A) 05/02/2022 1545   No results found for this or any previous visit (from the past 240 hour(s)).   DG CHEST PORT 1 VIEW  Result Date: 05/14/2022 CLINICAL DATA:  Cough.  Hemoptysis. EXAM: PORTABLE CHEST 1 VIEW COMPARISON:  04/17/2022 and older studies.  CT, 03/27/2022. FINDINGS: There is patchy opacity in the medial right upper lobe. Remainder of the lungs is clear. Lung volumes are low. No convincing pleural effusion.  No pneumothorax. Cardiac silhouette is normal in size. No mediastinal or hilar masses. Skeletal structures are grossly intact. IMPRESSION: 1. Patchy airspace opacity in the medial right upper lobe consistent with pneumonia. Electronically Signed   By: Amie Portland M.D.   On: 05/14/2022 12:52    Family Communication: Husband by phone at bedside  Disposition: Status is: Inpatient Remains inpatient appropriate because: Unsafe DC, requires SNF Planned Discharge Destination: Skilled nursing facility  Tyrone Nine, MD 05/15/2022 3:30 PM Page by [email or web address]

## 2022-05-15 NOTE — Telephone Encounter (Signed)
Pt husband returned call

## 2022-05-16 ENCOUNTER — Inpatient Hospital Stay (HOSPITAL_COMMUNITY): Payer: BC Managed Care – PPO

## 2022-05-16 DIAGNOSIS — M7989 Other specified soft tissue disorders: Secondary | ICD-10-CM | POA: Diagnosis not present

## 2022-05-16 DIAGNOSIS — N179 Acute kidney failure, unspecified: Secondary | ICD-10-CM | POA: Diagnosis not present

## 2022-05-16 DIAGNOSIS — G8929 Other chronic pain: Secondary | ICD-10-CM | POA: Diagnosis not present

## 2022-05-16 DIAGNOSIS — N3000 Acute cystitis without hematuria: Secondary | ICD-10-CM | POA: Diagnosis not present

## 2022-05-16 DIAGNOSIS — G9341 Metabolic encephalopathy: Secondary | ICD-10-CM | POA: Diagnosis not present

## 2022-05-16 LAB — RENAL FUNCTION PANEL
Albumin: 2.7 g/dL — ABNORMAL LOW (ref 3.5–5.0)
Anion gap: 11 (ref 5–15)
BUN: 39 mg/dL — ABNORMAL HIGH (ref 6–20)
CO2: 19 mmol/L — ABNORMAL LOW (ref 22–32)
Calcium: 8.4 mg/dL — ABNORMAL LOW (ref 8.9–10.3)
Chloride: 110 mmol/L (ref 98–111)
Creatinine, Ser: 1.37 mg/dL — ABNORMAL HIGH (ref 0.44–1.00)
GFR, Estimated: 45 mL/min — ABNORMAL LOW (ref >60)
Glucose, Bld: 114 mg/dL — ABNORMAL HIGH (ref 70–99)
Phosphorus: 3.6 mg/dL (ref 2.5–4.6)
Potassium: 4.6 mmol/L (ref 3.5–5.1)
Sodium: 140 mmol/L (ref 135–145)

## 2022-05-16 LAB — GLUCOSE, CAPILLARY
Glucose-Capillary: 114 mg/dL — ABNORMAL HIGH (ref 70–99)
Glucose-Capillary: 91 mg/dL (ref 70–99)
Glucose-Capillary: 246 mg/dL — ABNORMAL HIGH (ref 70–99)
Glucose-Capillary: 262 mg/dL — ABNORMAL HIGH (ref 70–99)

## 2022-05-16 LAB — ACID FAST CULTURE WITH REFLEXED SENSITIVITIES (MYCOBACTERIA): Acid Fast Culture: NEGATIVE

## 2022-05-16 LAB — C-REACTIVE PROTEIN: CRP: 5.6 mg/dL — ABNORMAL HIGH (ref <1.0)

## 2022-05-16 LAB — SEDIMENTATION RATE: Sed Rate: 23 mm/hr — ABNORMAL HIGH (ref 0–22)

## 2022-05-16 LAB — URIC ACID: Uric Acid, Serum: 2.8 mg/dL (ref 2.5–7.1)

## 2022-05-16 MED ORDER — FENTANYL CITRATE PF 50 MCG/ML IJ SOSY
12.5000 ug | PREFILLED_SYRINGE | Freq: Once | INTRAMUSCULAR | Status: AC
  Administered 2022-05-16: 12.5 ug via INTRAVENOUS
  Filled 2022-05-16: qty 1

## 2022-05-16 MED ORDER — OXYCODONE HCL 5 MG PO TABS
5.0000 mg | ORAL_TABLET | ORAL | Status: DC | PRN
  Administered 2022-05-16 (×4): 10 mg via ORAL
  Administered 2022-05-17: 5 mg via ORAL
  Administered 2022-05-17 – 2022-05-18 (×7): 10 mg via ORAL
  Filled 2022-05-16 (×12): qty 2

## 2022-05-16 MED ORDER — DIPHENHYDRAMINE HCL 50 MG/ML IJ SOLN
25.0000 mg | Freq: Once | INTRAMUSCULAR | Status: AC
  Administered 2022-05-16: 25 mg via INTRAVENOUS
  Filled 2022-05-16: qty 1

## 2022-05-16 MED ORDER — FENTANYL CITRATE PF 50 MCG/ML IJ SOSY
25.0000 ug | PREFILLED_SYRINGE | Freq: Once | INTRAMUSCULAR | Status: AC
  Administered 2022-05-16: 25 ug via INTRAVENOUS
  Filled 2022-05-16: qty 1

## 2022-05-16 MED ORDER — OXYCODONE-ACETAMINOPHEN 5-325 MG PO TABS
1.0000 | ORAL_TABLET | Freq: Once | ORAL | Status: AC
  Administered 2022-05-16: 1 via ORAL
  Filled 2022-05-16: qty 1

## 2022-05-16 NOTE — Telephone Encounter (Signed)
Referral was placed 

## 2022-05-16 NOTE — Progress Notes (Signed)
OT Cancellation Note  Patient Details Name: Lakeya Mulka MRN: [id number] DOB: [birth year]   Cancelled Treatment:    Reason Eval/Treat Not Completed: Other (comment) (pt reporting 10/10 BLE pain, xray negative, awaiting ultrasound for DVT. Will follow up as schedule permits)  Alfonzo Beers, OTD, OTR/L Acute Rehab [phone]   Mayer Masker 05/16/2022, 12:47 PM

## 2022-05-16 NOTE — Progress Notes (Signed)
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  She was recently admitted prior to this hospitalization for concerns of pneumonia, underwent bronchoscopy which showed Candida and was seen by infectious disease.  Admitted again on 6/26 with acute renal failure and concerns of UTI/pneumonia therefore started on broad-spectrum antibiotics.  Renal function continued to worsen and nephrology team was consulted.  She also had change in mental status and neurology was consulted who performed EEG which was unremarkable and LP was put on hold.  Nephrology eventually signed out as renal function stabilized.  Placed on heparin again but did not tolerate and had drop in hemoglobin again.  Started getting transfusion. Now off AC, her hb remains stable. Family working with [facility] for placement.   Assessment and Plan: Acute blood loss anemia on anemia of chronic kidney disease due to gluteal hematoma:   Hgb stabilized, hematoma clinically stable at this time s/p 8u PRBCs.  - Was cleared by Gen Surg for heparin Drip on 7/16 but patient had drop in hemoglobin again which was confirmed on CT abdomen pelvis with some extension of right gluteal hematoma. -Hb now remains stable. Should remain off AC atl least until 8/5, revaluate thereafter.    EGD in 2022-normal Colonoscopy-multiple diverticula, hemorrhoids with suspicion for fissure. Follows with [facility] heme-onc   AKI/ATN currently requiring HD -HD catheter is now out. Received few sessions of dialysis but now renal function has stabilized, nephrology signed off. - Nephrology sent off multiple biomarkers: *Of note C4, and Kappa light chains*  were elevated minimally but otherwise ANCA, ANA, RF, anti-dsDNA, c3, spep/sflc without abnormalities, hepatitis panel, HIV, EBV, CMV were unremarkable -Continue to monitor renal function closely.  Creatinine slightly trended up to 1.5 this morning.    History of pulmonary embolism June 2023 -Bled again after rechallenging her with hep drip once cleared by Dr Freida Busman from Gen Surg. No longer on AC. Will plan to keep her off at least until 8/5 then reevaluate.  -Patient received PRBC transfusion, repeat Doppler still negative for DVT.  Case discussed with IR who reviewed previous scans.  Patient is not a candidate for IVC filter.   Right upper lobe pneumonia: -Patient reports worsening of cough and shortness of breath with some blood in her sputum. -X-ray obtained which was concerning for pneumonia. - Continue augmentin x7 days given her immunocompromised state and will need repeat imaging 3-4 weeks after treatment to confirm resolution.  -Currently on room air    Hypertension: Stable.  - Continue amlodipine   Mild hyperkalemia: Resolved   Hypomagnesemia: Repleted   Acute metabolic encephalopathy, POA, multifactorial. Improved.  -Resolved, work-up including MRI, EEG negative.  Neurology signed off 7/6   Chronic pain, neuropathy, and chronic opioid dependence  Right lower Ext and Low back pain: Now with acute worsening of pain subjectively in L > R knees abrupt onset and severe. ?coincidental with initiation of SCDs, so ?if these provoked exacerbation of chronic pain. Exam is reassuring.  - With abrupt onset of LE pain in pt with known PE last month and not on anticoagulation, ?cord felt in left popliteal area, will need to r/o DVT with venous U/S. Note this was repeated 7/19 and negative at this time, though her clinical  picture has changed since that time.  - Check knee XR's to definitively r/o fracture/dislocation.  - Send inflammatory markers, expect some elevation with concurrent infection  being treated, but would perhaps change management if severely elevated. Send uric acid.  -Pain control.  -MRI Lumbar spine shows some strain in her back muscles and gluteal hematoma/hemorrhage. Continue pain control as ordered.   Granulomatosis with polyangiitis  - Followed by Rheumatology in [facility] system in [place], though pt/spouse are attempting to get referral to [facility] rheumatology. She's in rituximab, not due for dose at this time. Will plan to restart MTX on Sunday since this will conclude her abx for infection.  - Continue prednisone 10mg  daily in effort to taper back to home dose 5mg  daily    Secondary adrenal insufficiency  -Continue steroids    Current chronic use of systemic steroids -At home prednisone 5 mg daily.  Currently on 10 BID   Acute hypoxic respiratory failure, POA -Resolved now on room air   Presumed severe sepsis secondary to pneumonia versus UTI -Completed 5 days of antibiotic therapy    GERD  -Stable on PPI    COVID-19 virus infection, questionably incidental -Incidental positive finding at the time of admission.  Remdesivir held due to elevated LFTs.  Completed isolation.   Diabetes mellitus type II, insulin-dependent with hyperglycemia -A1c 8.6.  Accu-Chek sliding scale - cont Semglee to 15 units and NovoLog Premeal to 5 units 3 times daily AC   Obesity  With BMI of 35: Diet modification, exercise recommended   Disposition: PT/OT recommended SNF.  TOC has arranged for this pending insurance authorization and medical stability.  Obesity: Estimated body mass index is 35.26 kg/m as calculated from the following:   Height as of this encounter: 5\' 5"  (1.651 m).   Weight as of this encounter: 96.1 kg.   Subjective: States she has been crying in pain overnight, awoke with left leg/knee pain. Had SCDs applied and took it off, heard a pop she attributes to her knee, and states pain also began developing last night in right knee. No trauma or history of  this. Denies hx gout. No fever or rash. This occurred shortly after being placed on SCDs, hasn't been on anticoagulation. Subjective swelling in LE's reported.  Objective: Vitals:   05/15/22 2320 05/16/22 0401 05/16/22 0755 05/16/22 1157  BP: (!) 147/88 (!) 141/87 (!) 152/92 (!) 136/93  Pulse: 95 93 100 96  Resp: (!) 21 17 18 18   Temp: 97.7 F (36.5 C) 98.3 F (36.8 C) 98.2 F (36.8 C) 98.2 F (36.8 C)  TempSrc: Oral Oral Oral Oral  SpO2: 97% 99% 95%   Weight:      Height:       Gen: 56 y.o. female in no distress Pulm: Nonlabored breathing room air. Clear. CV: Regular rate and rhythm. No murmur, rub, or gallop. No JVD, no dependent edema. GI: Abdomen soft, non-tender, non-distended, with normoactive bowel sounds.  Ext: Warm, dry with stable bilateral TMAs. The knees appear symmetric without erythema warmth, visible or palpable effusion, or other deformity. There is tenderness at medial joint lines bilaterally but extends into calf bilaterally as well. There is pain in popliteal areas bilaterally without baker's cyst, though there is a possible cord in midline on left traversing superiorly.  Skin: No rashes, lesions or ulcers on visualized skin. Neuro: Alert and oriented. No focal neurological deficits. Psych: Judgement and insight appear fair, anxious, affect congruent. Behavior is appropriate.    Data Personally reviewed: CBC:  Recent Labs  Lab 05/11/22 0410 05/11/22 2156 05/12/22 0605 05/13/22 0812 05/14/22 0356 05/15/22 0641  WBC 11.2*  --  11.0* 10.3 9.6 9.2  HGB 7.6* 8.9* 9.6* 10.1* 9.8* 10.5*  HCT 23.5* 27.8* 29.3* 30.8* 29.7* 31.9*  MCV 89.0  --  85.4 85.1 85.1 85.3  PLT 237  --  229 224 222 233   Basic Metabolic Panel: Recent Labs  Lab 05/11/22 0410 05/12/22 0605 05/13/22 0812 05/14/22 0356 05/15/22 0641 05/16/22 0332  NA 140 144 139  139 139 141 140  K 5.3* 4.8 4.2  4.1 4.5 4.6 4.6  CL 111 115* 109  109 110 111 110  CO2 21* 22 21*  21* 21* 22 19*   GLUCOSE 348* 134* 92  94 223* 175* 114*  BUN 64* 52* 41*  42* 40* 39* 39*  CREATININE 1.87* 1.52* 1.53*  1.55* 1.59* 1.38* 1.37*  CALCIUM 8.7* 8.7* 8.3*  8.3* 8.2* 8.5* 8.4*  MG 2.0 1.5* 1.5* 1.9 1.5*  --   PHOS 5.2*  5.2* 3.5 4.0 4.0 4.1 3.6   Liver Function Tests: Recent Labs  Lab 05/12/22 0605 05/13/22 0812 05/14/22 0356 05/15/22 0641 05/16/22 0332  ALBUMIN 2.8* 2.8* 2.6* 2.7* 2.7*   CBG: Recent Labs  Lab 05/15/22 1113 05/15/22 1649 05/15/22 2140 05/16/22 0603 05/16/22 1200  GLUCAP 172* 313* 177* 114* 91   Urine analysis:    Component Value Date/Time   COLORURINE YELLOW 05/02/2022 1545   APPEARANCEUR HAZY (A) 05/02/2022 1545   LABSPEC 1.009 05/02/2022 1545   PHURINE 8.0 05/02/2022 1545   GLUCOSEU NEGATIVE 05/02/2022 1545   HGBUR SMALL (A) 05/02/2022 1545   BILIRUBINUR NEGATIVE 05/02/2022 1545   KETONESUR NEGATIVE 05/02/2022 1545   PROTEINUR 30 (A) 05/02/2022 1545   UROBILINOGEN 0.2 05/30/2013 1112   NITRITE NEGATIVE 05/02/2022 1545   LEUKOCYTESUR LARGE (A) 05/02/2022 1545   No results found for this or any previous visit (from the past 240 hour(s)).   DG Knee Left Port  Result Date: 05/16/2022 CLINICAL DATA:  Acute bilateral knee pain EXAM: PORTABLE LEFT KNEE - 1-2 VIEW; PORTABLE RIGHT KNEE - 1-2 VIEW COMPARISON:  Radiographs 07/09/2016 FINDINGS: No evidence of fracture, dislocation, or joint effusion. No evidence of arthropathy or other focal bone abnormality. Right patellar enthesophytes. Soft tissues are unremarkable. IMPRESSION: Negative bilateral knee radiographs. Electronically Signed   By: Minerva Fester M.D.   On: 05/16/2022 09:56   DG Knee Right Port  Result Date: 05/16/2022 CLINICAL DATA:  Acute bilateral knee pain EXAM: PORTABLE LEFT KNEE - 1-2 VIEW; PORTABLE RIGHT KNEE - 1-2 VIEW COMPARISON:  Radiographs 07/09/2016 FINDINGS: No evidence of fracture, dislocation, or joint effusion. No evidence of arthropathy or other focal bone abnormality.  Right patellar enthesophytes. Soft tissues are unremarkable. IMPRESSION: Negative bilateral knee radiographs. Electronically Signed   By: Minerva Fester M.D.   On: 05/16/2022 09:56    Family Communication: Husband by speaker phone at bedside  Disposition: Status is: Inpatient Remains inpatient appropriate because: Unsafe DC, requires SNF. Requires work up for LE pain prior to discharge.  Planned Discharge Destination: Skilled nursing facility likely 7/26.  Tyrone Nine, MD 05/16/2022 1:19 PM Page by [email or web address]

## 2022-05-16 NOTE — Progress Notes (Signed)
BLE venous duplex has been completed.  Preliminary findings given to Dr. Jarvis Newcomer.   Results can be found under chart review under CV PROC. 05/16/2022 3:53 PM [name] RVT, RDMS

## 2022-05-16 NOTE — Progress Notes (Deleted)
Carotid duplex has been completed.   Results can be found under chart review under CV PROC. 05/16/2022 3:08 PM Alexia Dinger RVT, RDMS

## 2022-05-16 NOTE — Progress Notes (Signed)
PT Cancellation Note  Patient Details Name: [name] MRN: [id number] DOB: September 07, 1966   Cancelled Treatment:    Reason Eval/Treat Not Completed: Other (comment). Pt reports having 10/10 bilat LE pain and that they took x-rays (which were negative for any fractures) and was awaiting for [place] to see if she has DVTs in LEs. Pt stated "the doctor told me not to do anything today." PT to return as able to progress mobility as appropriate/as able.  Lewis Shock, PT, DPT Acute Rehabilitation Services Secure chat preferred Office #: [phone]    Iona Hansen 05/16/2022, 11:18 AM

## 2022-05-17 ENCOUNTER — Inpatient Hospital Stay (HOSPITAL_COMMUNITY): Payer: BC Managed Care – PPO

## 2022-05-17 DIAGNOSIS — Z7952 Long term (current) use of systemic steroids: Secondary | ICD-10-CM | POA: Diagnosis not present

## 2022-05-17 DIAGNOSIS — G9341 Metabolic encephalopathy: Secondary | ICD-10-CM | POA: Diagnosis not present

## 2022-05-17 DIAGNOSIS — I82402 Acute embolism and thrombosis of unspecified deep veins of left lower extremity: Secondary | ICD-10-CM

## 2022-05-17 DIAGNOSIS — N3000 Acute cystitis without hematuria: Secondary | ICD-10-CM | POA: Diagnosis not present

## 2022-05-17 DIAGNOSIS — I82412 Acute embolism and thrombosis of left femoral vein: Secondary | ICD-10-CM

## 2022-05-17 DIAGNOSIS — N179 Acute kidney failure, unspecified: Secondary | ICD-10-CM | POA: Diagnosis not present

## 2022-05-17 HISTORY — PX: IR IVC FILTER PLMT / S&I /IMG GUID/MOD SED: IMG701

## 2022-05-17 LAB — RENAL FUNCTION PANEL
Albumin: 2.8 g/dL — ABNORMAL LOW (ref 3.5–5.0)
Anion gap: 10 (ref 5–15)
BUN: 34 mg/dL — ABNORMAL HIGH (ref 6–20)
CO2: 21 mmol/L — ABNORMAL LOW (ref 22–32)
Calcium: 8.8 mg/dL — ABNORMAL LOW (ref 8.9–10.3)
Chloride: 105 mmol/L (ref 98–111)
Creatinine, Ser: 1.2 mg/dL — ABNORMAL HIGH (ref 0.44–1.00)
GFR, Estimated: 53 mL/min — ABNORMAL LOW (ref >60)
Glucose, Bld: 119 mg/dL — ABNORMAL HIGH (ref 70–99)
Phosphorus: 4.5 mg/dL (ref 2.5–4.6)
Potassium: 4.1 mmol/L (ref 3.5–5.1)
Sodium: 136 mmol/L (ref 135–145)

## 2022-05-17 LAB — GLUCOSE, CAPILLARY
Glucose-Capillary: 112 mg/dL — ABNORMAL HIGH (ref 70–99)
Glucose-Capillary: 278 mg/dL — ABNORMAL HIGH (ref 70–99)
Glucose-Capillary: 172 mg/dL — ABNORMAL HIGH (ref 70–99)

## 2022-05-17 LAB — MAGNESIUM: Magnesium: 1.3 mg/dL — ABNORMAL LOW (ref 1.7–2.4)

## 2022-05-17 MED ORDER — DIPHENHYDRAMINE HCL 25 MG PO CAPS
25.0000 mg | ORAL_CAPSULE | Freq: Three times a day (TID) | ORAL | Status: AC | PRN
  Administered 2022-05-17: 25 mg via ORAL
  Filled 2022-05-17: qty 1

## 2022-05-17 MED ORDER — MAGNESIUM OXIDE -MG SUPPLEMENT 400 (240 MG) MG PO TABS
400.0000 mg | ORAL_TABLET | Freq: Two times a day (BID) | ORAL | Status: AC
  Administered 2022-05-17 (×2): 400 mg via ORAL
  Filled 2022-05-17 (×2): qty 1

## 2022-05-17 MED ORDER — METHOCARBAMOL 500 MG PO TABS
500.0000 mg | ORAL_TABLET | Freq: Once | ORAL | Status: AC | PRN
Start: 2022-05-17 — End: 2022-05-17
  Administered 2022-05-17: 500 mg via ORAL
  Filled 2022-05-17: qty 1

## 2022-05-17 MED ORDER — LIDOCAINE HCL 1 % IJ SOLN
INTRAMUSCULAR | Status: AC
  Administered 2022-05-17: 10 mL
  Filled 2022-05-17: qty 20

## 2022-05-17 MED ORDER — FENTANYL CITRATE (PF) 100 MCG/2ML IJ SOLN
INTRAMUSCULAR | Status: AC
  Filled 2022-05-17: qty 4

## 2022-05-17 MED ORDER — INSULIN ASPART 100 UNIT/ML IJ SOLN: 0.0000 [IU] | Freq: Every day | INTRAMUSCULAR | Status: DC

## 2022-05-17 MED ORDER — IOHEXOL 300 MG/ML  SOLN
100.0000 mL | Freq: Once | INTRAMUSCULAR | Status: AC | PRN
  Administered 2022-05-17: 35 mL via INTRA_ARTERIAL

## 2022-05-17 MED ORDER — INSULIN ASPART 100 UNIT/ML IJ SOLN
6.0000 [IU] | Freq: Three times a day (TID) | INTRAMUSCULAR | Status: DC
Start: 2022-05-17 — End: 2022-05-18
  Administered 2022-05-17 – 2022-05-18 (×3): 6 [IU] via SUBCUTANEOUS

## 2022-05-17 MED ORDER — INSULIN ASPART 100 UNIT/ML IJ SOLN
0.0000 [IU] | Freq: Three times a day (TID) | INTRAMUSCULAR | Status: DC
  Administered 2022-05-17: 11 [IU] via SUBCUTANEOUS
  Administered 2022-05-18: 7 [IU] via SUBCUTANEOUS
  Administered 2022-05-18: 3 [IU] via SUBCUTANEOUS

## 2022-05-17 MED ORDER — FENTANYL CITRATE (PF) 100 MCG/2ML IJ SOLN
INTRAMUSCULAR | Status: AC | PRN
  Administered 2022-05-17: 25 ug via INTRAVENOUS
  Administered 2022-05-17: 50 ug via INTRAVENOUS

## 2022-05-17 MED ORDER — DIPHENHYDRAMINE HCL 50 MG/ML IJ SOLN
25.0000 mg | Freq: Once | INTRAMUSCULAR | Status: AC
  Administered 2022-05-17: 25 mg via INTRAVENOUS
  Filled 2022-05-17: qty 1

## 2022-05-17 NOTE — Progress Notes (Signed)
Occupational Therapy Treatment Patient Details Name: Keyly Dia MRN: [id number] DOB: 01/01/1966 Today's Date: 05/17/2022   History of present illness Pt is a 56 y.o. female admitted 6/26 after being found by her husband on the floor at home with AMS. Admitted with dx of acute metabolic encephalopathy. Incidental COVID+ on admission. Initially admitted to [facility] and transferred to [facility] 7/1 for initiation of HD. Multiple recent hospitalizations, 4/3-5/4 and 6/4-6/14. PMH: PE, Wegener's disease, CKD 3, DM, HTN, chronic pain with an intrathecal pump, OSA, bilat mid-foot amputations, peripheral neuropathy   OT comments  Pt progressing towards goals this session, able to stand in [place] and transfer to chair with max A +2, pt reporting RLE pain and requires increased motivation to participate. Pt mod A for bed mobility, able to sit EOB for ~8mins for seated grooming tasks. Pt presenting with impairments listed below, will follow acutely. Continue to recommend SNF at d/c.   Recommendations for follow up therapy are one component of a multi-disciplinary discharge planning process, led by the attending physician.  Recommendations may be updated based on patient status, additional functional criteria and insurance authorization.    Follow Up Recommendations  Skilled nursing-short term rehab (<3 hours/day)    Assistance Recommended at Discharge Frequent or constant Supervision/Assistance  Patient can return home with the following  A lot of help with walking and/or transfers;A lot of help with bathing/dressing/bathroom;Assistance with cooking/housework;Assistance with feeding;Direct supervision/assist for medications management;Direct supervision/assist for financial management;Assist for transportation;Help with stairs or ramp for entrance   Equipment Recommendations  None recommended by OT;Other (comment)    Recommendations for Other Services PT consult    Precautions / Restrictions  Precautions Precautions: Fall Precaution Comments: h/o bilateral transmet amputations Restrictions Weight Bearing Restrictions: No       Mobility Bed Mobility Overal bed mobility: Needs Assistance Bed Mobility: Rolling, Sidelying to Sit   Sidelying to sit: Mod assist, HOB elevated            Transfers Overall transfer level: Needs assistance Equipment used: Ambulation equipment used (stedy) Transfers: Sit to/from Stand, Bed to chair/wheelchair/BSC Sit to Stand: Max assist, +2 physical assistance           General transfer comment: pt reporting nausea with sit to stand transfer/in [employer] via Lift Equipment: Stedy   Balance Overall balance assessment: Needs assistance Sitting-balance support: Feet supported, Bilateral upper extremity supported Sitting balance-Leahy Scale: Fair     Standing balance support: During functional activity, Bilateral upper extremity supported Standing balance-Leahy Scale: Poor Standing balance comment: reliant on external support                           ADL either performed or assessed with clinical judgement   ADL       Grooming: Set up;Oral care;Sitting;Wash/dry face Grooming Details (indicate cue type and reason): sitting EOB Upper Body Bathing: Moderate assistance;Sitting Upper Body Bathing Details (indicate cue type and reason): to wash back         Lower Body Dressing: Moderate assistance Lower Body Dressing Details (indicate cue type and reason): dons L sock at bed level, total A to don R sock Toilet Transfer: Maximal assistance;+2 for physical assistance Toilet Transfer Details (indicate cue type and reason): with use of Stedy         Functional mobility during ADLs: Maximal assistance;+2 for safety/equipment;+2 for physical assistance      Extremity/Trunk Assessment Upper Extremity Assessment Upper Extremity Assessment: Generalized weakness  Lower Extremity Assessment Lower Extremity  Assessment: Defer to PT evaluation        Vision   Vision Assessment?: No apparent visual deficits   Perception Perception Perception: Within Functional Limits   Praxis Praxis Praxis: Intact    Cognition Arousal/Alertness: Awake/alert Behavior During Therapy: Flat affect Overall Cognitive Status: Within Functional Limits for tasks assessed                                 General Comments: some slow processing, but oriented and follows commands, with encouragement pt pushes herself        Exercises      Shoulder Instructions       General Comments VSS on RA    Pertinent Vitals/ Pain       Pain Assessment Pain Assessment: Faces Pain Score: 7  Faces Pain Scale: Hurts whole lot Pain Location: R hip with mobility Pain Descriptors / Indicators: Discomfort, Grimacing, Sore Pain Intervention(s): Limited activity within patient's tolerance, Monitored during session, Repositioned  Home Living                                          Prior Functioning/Environment              Frequency  Min 2X/week        Progress Toward Goals  OT Goals(current goals can now be found in the care plan section)  Progress towards OT goals: Progressing toward goals  Acute Rehab OT Goals Patient Stated Goal: decrease pain OT Goal Formulation: With patient Time For Goal Achievement: 05/26/22 Potential to Achieve Goals: Good ADL Goals Pt Will Perform Grooming: with modified independence;sitting Pt Will Perform Lower Body Bathing: with min assist;sitting/lateral leans;sit to/from stand Pt Will Perform Lower Body Dressing: with min assist;sit to/from stand;sitting/lateral leans;with adaptive equipment Pt Will Transfer to Toilet: with mod assist;with +2 assist;bedside commode Pt Will Perform Toileting - Clothing Manipulation and hygiene: with min assist;sitting/lateral leans;sit to/from stand  Plan Discharge plan remains appropriate;Frequency  remains appropriate    Co-evaluation    PT/OT/SLP Co-Evaluation/Treatment: Yes Reason for Co-Treatment: For patient/therapist safety;To address functional/ADL transfers   OT goals addressed during session: ADL's and self-care      AM-PAC OT "6 Clicks" Daily Activity     Outcome Measure   Help from another person eating meals?: A Little Help from another person taking care of personal grooming?: A Little Help from another person toileting, which includes using toliet, bedpan, or urinal?: Total Help from another person bathing (including washing, rinsing, drying)?: A Lot Help from another person to put on and taking off regular upper body clothing?: A Little Help from another person to put on and taking off regular lower body clothing?: A Lot 6 Click Score: 14    End of Session Equipment Utilized During Treatment: Gait belt;Other (comment) (stedy)  OT Visit Diagnosis: Unsteadiness on feet (R26.81);Other abnormalities of gait and mobility (R26.89);Muscle weakness (generalized) (M62.81) Pain - Right/Left: Right Pain - part of body: Hip   Activity Tolerance Patient limited by pain   Patient Left in chair;with call bell/phone within reach;with chair alarm set   Nurse Communication Mobility status (cleared with MD can mobilize pt)        Time: 7253-6644 OT Time Calculation (min): 27 min  Charges: OT General Charges $OT Visit: 1 Visit OT  Treatments $Self Care/Home Management : 8-22 mins  Alfonzo Beers, OTD, OTR/L Acute Rehab [phone]   Mayer Masker 05/17/2022, 12:09 PM

## 2022-05-17 NOTE — Progress Notes (Signed)
Physical Therapy Treatment Patient Details Name: [name] MRN: [id number] DOB: 03-30-1966 Today's Date: 05/17/2022   History of Present Illness Pt is a 56 y.o. female admitted 6/26 after being found by her husband on the floor at home with AMS. Admitted with dx of acute metabolic encephalopathy. Incidental COVID+ on admission. Initially admitted to [facility] and transferred to [facility] 7/1 for initiation of HD. Multiple recent hospitalizations, 4/3-5/4 and 6/4-6/14. PMH: PE, Wegener's disease, CKD 3, DM, HTN, chronic pain with an intrathecal pump, OSA, bilat mid-foot amputations, peripheral neuropathy    PT Comments    Pt with depressed mood and requires max encouragement to participate in OOB mobility. Pt reports 8/10 pain in R LE with mobility limiting standing tolerance. Co-ordinated with RN for patient to receive pain medicine 1 hour before in which pt states "that helps." Pt stood x2, limited by R LE pain. Encouraged pt to stay up for 2 hours minimally and to continue LE HEP.    Recommendations for follow up therapy are one component of a multi-disciplinary discharge planning process, led by the attending physician.  Recommendations may be updated based on patient status, additional functional criteria and insurance authorization.  Follow Up Recommendations  Skilled nursing-short term rehab (<3 hours/day) Can patient physically be transported by private vehicle: No   Assistance Recommended at Discharge Frequent or constant Supervision/Assistance  Patient can return home with the following Two people to help with walking and/or transfers;Assistance with cooking/housework;Assist for transportation;Two people to help with bathing/dressing/bathroom;Help with stairs or ramp for entrance   Equipment Recommendations  Other (comment) (defer to next venue)    Recommendations for Other Services       Precautions / Restrictions Precautions Precautions: Fall Precaution Comments: h/o bilateral  transmet amputations Restrictions Weight Bearing Restrictions: No     Mobility  Bed Mobility Overal bed mobility: Needs Assistance Bed Mobility: Rolling, Sidelying to Sit Rolling: Min assist Sidelying to sit: Mod assist, HOB elevated       General bed mobility comments: with verbal cues pt with initiative to roll, using body momentum, step by step cues provided, modA for trunk elevation to EOB    Transfers Overall transfer level: Needs assistance Equipment used: Ambulation equipment used (stedy) Transfers: Sit to/from Stand, Bed to chair/wheelchair/BSC Sit to Stand: Max assist, +2 physical assistance           General transfer comment: pt reporting nausea with sit to stand transfer/in Stedy, pt continues to require maxAx2 to power up into stedy, poor trunk control and leans over stedy bar, once seated on stedy pt holds UEs in extension to support self Transfer via Lift Equipment: Stedy  Ambulation/Gait Ambulation/Gait assistance:  (unable due to weakness and poor standing trials with increased assistance.)                 Stairs             Wheelchair Mobility    Modified Rankin (Stroke Patients Only)       Balance Overall balance assessment: Needs assistance Sitting-balance support: Feet supported, Bilateral upper extremity supported Sitting balance-Leahy Scale: Fair Sitting balance - Comments: pt tolerated EOB x 5 min to brush teeth and wash face, pt did lean on bedside table with UEs for stability during tasks, able to sit EOB static without support   Standing balance support: During functional activity, Bilateral upper extremity supported Standing balance-Leahy Scale: Poor Standing balance comment: reliant on external support  Cognition Arousal/Alertness: Awake/alert Behavior During Therapy: Flat affect Overall Cognitive Status: Within Functional Limits for tasks assessed                                  General Comments: some slow processing, but oriented and follows commands, with encouragement pt pushes herself, depressed mood/spirits        Exercises General Exercises - Lower Extremity Quad Sets: AROM, Both, Supine, 10 reps (with 5 sec hold) Long Arc Quad: Strengthening, Both, 10 reps, Seated    General Comments General comments (skin integrity, edema, etc.): HR up to 113bpm at EOB      Pertinent Vitals/Pain Pain Assessment Pain Assessment: 0-10 Faces Pain Scale: Hurts whole lot Pain Location: R hip with mobility Pain Descriptors / Indicators: Discomfort, Grimacing, Sore    Home Living                          Prior Function            PT Goals (current goals can now be found in the care plan section) Acute Rehab PT Goals Patient Stated Goal: decreased pain PT Goal Formulation: With patient Time For Goal Achievement: 05/26/22 Potential to Achieve Goals: Good Progress towards PT goals: Progressing toward goals    Frequency    Min 3X/week      PT Plan Current plan remains appropriate    Co-evaluation PT/OT/SLP Co-Evaluation/Treatment: Yes Reason for Co-Treatment: For patient/therapist safety PT goals addressed during session: Mobility/safety with mobility OT goals addressed during session: ADL's and self-care      AM-PAC PT "6 Clicks" Mobility   Outcome Measure  Help needed turning from your back to your side while in a flat bed without using bedrails?: A Little Help needed moving from lying on your back to sitting on the side of a flat bed without using bedrails?: A Little Help needed moving to and from a bed to a chair (including a wheelchair)?: Total Help needed standing up from a chair using your arms (e.g., wheelchair or bedside chair)?: A Lot Help needed to walk in hospital room?: Total Help needed climbing 3-5 steps with a railing? : Total 6 Click Score: 11    End of Session Equipment Utilized During Treatment: Gait  belt Activity Tolerance: Patient limited by pain Patient left: with call bell/phone within reach;in chair;with chair alarm set Nurse Communication: Mobility status;Need for lift equipment (use stedy to get back) PT Visit Diagnosis: Muscle weakness (generalized) (M62.81);Other abnormalities of gait and mobility (R26.89);Pain Pain - Right/Left: Right Pain - part of body: Hip     Time: 2542-7062 PT Time Calculation (min) (ACUTE ONLY): 46 min  Charges:  $Therapeutic Activity: 8-22 mins                     Lewis Shock, PT, DPT Acute Rehabilitation Services Secure chat preferred Office #: [phone]    Iona Hansen 05/17/2022, 1:17 PM

## 2022-05-17 NOTE — Procedures (Addendum)
Interventional Radiology Procedure Note  Date of Procedure: 05/17/2022  Procedure: IVC filter placement   Findings:  1. Successful Denail IVC filter placement via right CFV access    Complications: No immediate complications noted.   Estimated Blood Loss: minimal  Follow-up and Recommendations: 1. This filter is potentially retrievable. If patient becomes low risk for thromboembolic event and/or can resume AC, the patient may be evaluated by IR for filter retrieval.  2. Bedrest 2 hours post procedure    Olive Bass, MD  Vascular & Interventional Radiology  05/17/2022 2:11 PM

## 2022-05-17 NOTE — Progress Notes (Signed)
[facility] PROGRESS NOTE    Progress Note  [name]  MVH:[id number] DOB: 27-Feb-1966 DOA: 04/17/2022 PCP: [name], [name]     Brief Narrative:   [name] is an 56 y.o. 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  Family discharged on the hospital on 04/05/2022 for UTI and pneumonia treated with broad-spectrum antibiotics.  Admitted to the hospital for shortness of breath urinary frequency and altered mental status and renal function worsen so nephrology was consulted as she required temporary hemodialysis, nephrology eventually signed off as renal function improved and stabilized, also her mental status deteriorated neurology was consulted to perform an EEG which was unremarkable, LP was put on hold.  Had to be placed on heparin but had to be stopped as her hemoglobin dropped due to a gluteal hematoma, she was transfused now off her anticoagulation and her hemoglobin remained stable.    Assessment/Plan:   Acute blood loss anemia/anemia of chronic disease due to gluteal hematoma: Heparin was stopped hemoglobin has stabilized she status post 8 units of packed red blood cells. Was cleared by general surgery for heparin drip on 05/07/2022, but her hemoglobin dropped again and CT scan of the abdomen pelvis confirmed extension of the right gluteal hematoma. Taken off as heparin should remain off AC until 05/27/2022 to reevaluate thereafter.  Acute kidney injury/ATN which required HD: Required temporary HD, renal function is stabilized and nephrology has signed off. Creatinine remained stable at 1.5-1.2.  History of PE in June 2023: After being rechallenged with heparin drip she bled, cleared by general surgery. Plan was to keep off heparin until 05/27/2022 and then  reevaluate. Her last hemoglobin was 10.5. Patient received several units of packed red blood cells. Repeated Doppler study positive for nonocclusive age-indeterminate DVT. We will go ahead and consult hematology as she is at risk of bleeding and not a candidate for IVC. We will go ahead and reconsult interventional radiology for IVC filter candidacy. We will consult hematology.  Right upper lobe pneumonia: She reported some cough and shortness of breath she is to complete a 7-day course of Augmentin given her immunocompromise state. We will need a repeat CT scan in 6 weeks. Currently on room air.  Essential hypertension: Continue amlodipine.  Mild hyperkalemia: Resolved.  Hypomagnesemia: Replete orally now resolved.  Acute metabolic encephalopathy: Multifactorial resolved work-up including MRI EEG which was negative. Neurology has signed off on 04/27/2022.  Chronic pain/neuropathic and chronic opiate dependence: Lower extremity and low back pain now with a subjective left greater than right abrupt onset pain which coincides with initiation of SCDs. Lower extremity Doppler was done. MRI of the lumbar spine shows some strain in her back muscles and gluteal hematoma and hemorrhage. Continue current narcotic regimen.  Granulomatosis with polyangiitis: Follow-up with rheumatologist and [facility]. She is currently on rituximab due for dose at this time. Can resume her methotrexate when she is completed her antibiotic regimen.  Secondary adrenal insufficiency Steroids.  Acute respiratory failure with hypoxia present on admission: Now on room air.  Presumed severe sepsis secondary to pneumonia: She is to complete a 7-day course of antibiotics.  GERD: Continue PPI.  COVID-19 viral infection question incidental: She has completed her isolation course.  Diabetes mellitus type 2: Insulin-dependent with an A1c of 8.6, she is currently on long-acting insulin plus sliding scale. She  is currently on hospice  care.  We will change her to sliding scale resistant and continue with insulin.  Obesity: Noted.  Disposition: Physical therapy was evaluated the patient recommended skilled nursing facility.  Awaiting insurance authorization and medical stability.    DVT prophylaxis: SCD Family Communication:husband Status is: Inpatient Remains inpatient appropriate because: New DVT    Code Status:     Code Status Orders  (From admission, onward)           Start     Ordered   04/17/22 1954  Full code  Continuous        04/17/22 1954           Code Status History     Date Active Date Inactive Code Status Order ID Comments User Context   04/17/2022 1953 04/17/2022 1954 Full Code [id number]  Vassie Loll, MD Inpatient   03/26/2022 1410 04/05/2022 2258 Full Code [id number]  Catarina Hartshorn, MD Inpatient   02/19/2022 1947 02/23/2022 1858 Full Code [id number]  Coralie Keens, MD Inpatient   01/05/2022 0040 01/08/2022 1806 Full Code [id number]  Carollee Herter, DO Inpatient   01/04/2022 2002 01/05/2022 0040 Full Code [id number]  Carollee Herter, DO ED   03/24/2021 1051 03/30/2021 2352 Full Code [id number]  Clydie Braun, MD Inpatient   11/01/2020 2001 11/03/2020 1904 Full Code [id number]  Erick Blinks, MD ED   07/07/2016 1018 07/14/2016 2121 Full Code [id number]  Claud Kelp, MD Inpatient   07/04/2016 1834 07/07/2016 1018 Full Code [id number]  Alison Murray, MD Inpatient   05/30/2013 1645 06/03/2013 1910 Full Code [id number]  Elease Etienne, MD Inpatient         IV Access:   Peripheral IV   Procedures and diagnostic studies:   VAS [place] LOWER EXTREMITY VENOUS (DVT)  Result Date: 05/16/2022  Lower Venous DVT Study Patient Name:  [name]  Date of Exam:   05/16/2022 Medical Rec #: [id number]    Accession #:    [id number] Date of Birth: 09/21/1966    Patient Gender: F Patient Age:   61 years Exam Location:  [facility] Procedure:      VAS [place] LOWER EXTREMITY VENOUS (DVT)  Referring Phys: Hazeline Junker --------------------------------------------------------------------------------  Indications: Swelling. Other Indications: Patient unable to be anticoagulated at this time to abnormal                    Hb levels. Risk Factors: PE diagnosed 03/27/22. Limitations: Poor ultrasound/tissue interface. Comparison Study: Previous exam on 05/10/22 was negative for DVT. Performing Technologist: Ernestene Mention RVT, RDMS  Examination Guidelines: A complete evaluation includes B-mode imaging, spectral Doppler, color Doppler, and power Doppler as needed of all accessible portions of each vessel. Bilateral testing is considered an integral part of a complete examination. Limited examinations for reoccurring indications may be performed as noted. The reflux portion of the exam is performed with the patient in reverse Trendelenburg.  +---------+---------------+---------+-----------+----------+--------------+ RIGHT    CompressibilityPhasicitySpontaneityPropertiesThrombus Aging +---------+---------------+---------+-----------+----------+--------------+ CFV      Full           Yes      Yes                                 +---------+---------------+---------+-----------+----------+--------------+ SFJ      Full                                                        +---------+---------------+---------+-----------+----------+--------------+  FV Prox  Full           Yes      Yes                                 +---------+---------------+---------+-----------+----------+--------------+ FV Mid   Full           Yes      Yes                                 +---------+---------------+---------+-----------+----------+--------------+ FV DistalFull           Yes      Yes                                 +---------+---------------+---------+-----------+----------+--------------+ PFV      Full                                                         +---------+---------------+---------+-----------+----------+--------------+ POP      Full           Yes      Yes                                 +---------+---------------+---------+-----------+----------+--------------+ PTV      Full                                                        +---------+---------------+---------+-----------+----------+--------------+ PERO     Full                                                        +---------+---------------+---------+-----------+----------+--------------+   +---------+---------------+---------+-----------+----------+-----------------+ LEFT     CompressibilityPhasicitySpontaneityPropertiesThrombus Aging    +---------+---------------+---------+-----------+----------+-----------------+ CFV      Full           Yes      Yes                                    +---------+---------------+---------+-----------+----------+-----------------+ SFJ      Full                                                           +---------+---------------+---------+-----------+----------+-----------------+ FV Prox  Partial        Yes      Yes                  Age Indeterminate +---------+---------------+---------+-----------+----------+-----------------+ FV Mid   Full  Yes      Yes                                    +---------+---------------+---------+-----------+----------+-----------------+ FV DistalFull           Yes      Yes                                    +---------+---------------+---------+-----------+----------+-----------------+ PFV      Full                                                           +---------+---------------+---------+-----------+----------+-----------------+ POP      Full           Yes      Yes                                    +---------+---------------+---------+-----------+----------+-----------------+ PTV      Full                                                            +---------+---------------+---------+-----------+----------+-----------------+ PERO     Full                                                           +---------+---------------+---------+-----------+----------+-----------------+     Summary: BILATERAL: -No evidence of popliteal cyst, bilaterally. RIGHT: - There is no evidence of deep vein thrombosis in the lower extremity.  LEFT: - Findings consistent with non occlusive, age indeterminate deep vein thrombosis involving the proximal portion of the left femoral vein.  *See table(s) above for measurements and observations. Electronically signed by Waverly Ferrarihristopher Dickson MD on 05/16/2022 at 4:23:20 PM.    Final    DG Knee Left Port  Result Date: 05/16/2022 CLINICAL DATA:  Acute bilateral knee pain EXAM: PORTABLE LEFT KNEE - 1-2 VIEW; PORTABLE RIGHT KNEE - 1-2 VIEW COMPARISON:  Radiographs 07/09/2016 FINDINGS: No evidence of fracture, dislocation, or joint effusion. No evidence of arthropathy or other focal bone abnormality. Right patellar enthesophytes. Soft tissues are unremarkable. IMPRESSION: Negative bilateral knee radiographs. Electronically Signed   By: Minerva Festeryler  Stutzman M.D.   On: 05/16/2022 09:56   DG Knee Right Port  Result Date: 05/16/2022 CLINICAL DATA:  Acute bilateral knee pain EXAM: PORTABLE LEFT KNEE - 1-2 VIEW; PORTABLE RIGHT KNEE - 1-2 VIEW COMPARISON:  Radiographs 07/09/2016 FINDINGS: No evidence of fracture, dislocation, or joint effusion. No evidence of arthropathy or other focal bone abnormality. Right patellar enthesophytes. Soft tissues are unremarkable. IMPRESSION: Negative bilateral knee radiographs. Electronically Signed   By: Minerva Festeryler  Stutzman M.D.   On: 05/16/2022 09:56     Medical Consultants:   None.   Subjective:    Misty StanleyLisa  Sahota was very chilled for this morning as we had a conversation about her poor prognosis, still complaining of leg pain.  Objective:    Vitals:   05/16/22 1925 05/16/22 2328 05/17/22 0415  05/17/22 0700  BP: (!) 144/83 (!) 140/95 131/88 135/87  Pulse: 89 87 96 98  Resp: 16 16 17 18   Temp: 98.4 F (36.9 C) 98.7 F (37.1 C) 98 F (36.7 C) 98.1 F (36.7 C)  TempSrc: Oral Oral Oral Oral  SpO2: 97% 93% 93% 98%  Weight:      Height:       SpO2: 98 % O2 Flow Rate (L/min): 3 L/min   Intake/Output Summary (Last 24 hours) at 05/17/2022 0915 Last data filed at 05/17/2022 0911 Gross per 24 hour  Intake 480 ml  Output 2300 ml  Net -1820 ml   Filed Weights   05/05/22 1233 05/05/22 1654 05/09/22 0326  Weight: 95.2 kg 95.2 kg 96.1 kg    Exam: General exam: In no acute distress. Respiratory system: Good air movement and clear to auscultation. Cardiovascular system: S1 & S2 heard, RRR. No JVD. Gastrointestinal system: Abdomen is nondistended, soft and nontender.  Extremities: No pedal edema. Skin: No rashes, lesions or ulcers Psychiatry: Judgement and insight appear normal. Mood & affect appropriate.    Data Reviewed:    Labs: Basic Metabolic Panel: Recent Labs  Lab 05/11/22 0410 05/12/22 05/14/22 05/13/22 05/15/22 05/14/22 0356 05/15/22 0641 05/16/22 0332 05/17/22 0349  NA 140 144 139  139 139 141 140 136  K 5.3* 4.8 4.2  4.1 4.5 4.6 4.6 4.1  CL 111 115* 109  109 110 111 110 105  CO2 21* 22 21*  21* 21* 22 19* 21*  GLUCOSE 348* 134* 92  94 223* 175* 114* 119*  BUN 64* 52* 41*  42* 40* 39* 39* 34*  CREATININE 1.87* 1.52* 1.53*  1.55* 1.59* 1.38* 1.37* 1.20*  CALCIUM 8.7* 8.7* 8.3*  8.3* 8.2* 8.5* 8.4* 8.8*  MG 2.0 1.5* 1.5* 1.9 1.5*  --   --   PHOS 5.2*  5.2* 3.5 4.0 4.0 4.1 3.6 4.5   GFR Estimated Creatinine Clearance: 60 mL/min (A) (by C-G formula based on SCr of 1.2 mg/dL (H)). Liver Function Tests: Recent Labs  Lab 05/13/22 05/15/22 05/14/22 0356 05/15/22 0641 05/16/22 0332 05/17/22 0349  ALBUMIN 2.8* 2.6* 2.7* 2.7* 2.8*   No results for input(s): "LIPASE", "AMYLASE" in the last 168 hours. No results for input(s): "AMMONIA" in the last 168  hours. Coagulation profile No results for input(s): "INR", "PROTIME" in the last 168 hours. COVID-19 Labs  Recent Labs    05/16/22 0949  CRP 5.6*    Lab Results  Component Value Date   SARSCOV2NAA POSITIVE (A) 04/17/2022   SARSCOV2NAA NEGATIVE 03/26/2022   SARSCOV2NAA NEGATIVE 02/19/2022   SARSCOV2NAA NEGATIVE 01/04/2022    CBC: Recent Labs  Lab 05/11/22 0410 05/11/22 2156 05/12/22 0605 05/13/22 0812 05/14/22 0356 05/15/22 0641  WBC 11.2*  --  11.0* 10.3 9.6 9.2  HGB 7.6* 8.9* 9.6* 10.1* 9.8* 10.5*  HCT 23.5* 27.8* 29.3* 30.8* 29.7* 31.9*  MCV 89.0  --  85.4 85.1 85.1 85.3  PLT 237  --  229 224 222 233   Cardiac Enzymes: No results for input(s): "CKTOTAL", "CKMB", "CKMBINDEX", "TROPONINI" in the last 168 hours. BNP (last 3 results) No results for input(s): "PROBNP" in the last 8760 hours. CBG: Recent Labs  Lab 05/16/22 0603 05/16/22 1200 05/16/22 1655 05/16/22 2142 05/17/22 0647  GLUCAP 114*  91 246* 262* 112*   D-Dimer: No results for input(s): "DDIMER" in the last 72 hours. Hgb A1c: No results for input(s): "HGBA1C" in the last 72 hours. Lipid Profile: No results for input(s): "CHOL", "HDL", "LDLCALC", "TRIG", "CHOLHDL", "LDLDIRECT" in the last 72 hours. Thyroid function studies: No results for input(s): "TSH", "T4TOTAL", "T3FREE", "THYROIDAB" in the last 72 hours.  Invalid input(s): "FREET3" Anemia work up: No results for input(s): "VITAMINB12", "FOLATE", "FERRITIN", "TIBC", "IRON", "RETICCTPCT" in the last 72 hours. Sepsis Labs: Recent Labs  Lab 05/12/22 0605 05/13/22 0812 05/14/22 0356 05/15/22 0641  WBC 11.0* 10.3 9.6 9.2   Microbiology No results found for this or any previous visit (from the past 240 hour(s)).   Medications:    amLODipine  10 mg Oral Daily   amoxicillin-clavulanate  1 tablet Oral Q12H   diclofenac Sodium  2 g Topical QID   DULoxetine  30 mg Oral Daily   folic acid  2 mg Oral Daily   insulin aspart  0-9 Units  Subcutaneous TID WC   insulin aspart  5 Units Subcutaneous TID WC   insulin glargine-yfgn  15 Units Subcutaneous QHS   linaclotide  145 mcg Oral QAC breakfast   pantoprazole  40 mg Oral Daily   predniSONE  10 mg Oral Q breakfast   senna  2 tablet Oral BID   sodium chloride flush  10-40 mL Intracatheter Q12H   vitamin B-12  1,000 mcg Oral Daily   Continuous Infusions:    LOS: 30 days   Marinda Elk  [facility]  05/17/2022, 9:15 AM

## 2022-05-18 ENCOUNTER — Inpatient Hospital Stay (HOSPITAL_COMMUNITY): Payer: BC Managed Care – PPO

## 2022-05-18 DIAGNOSIS — I82412 Acute embolism and thrombosis of left femoral vein: Secondary | ICD-10-CM | POA: Diagnosis not present

## 2022-05-18 DIAGNOSIS — U071 COVID-19: Secondary | ICD-10-CM | POA: Diagnosis not present

## 2022-05-18 DIAGNOSIS — N179 Acute kidney failure, unspecified: Secondary | ICD-10-CM | POA: Diagnosis not present

## 2022-05-18 DIAGNOSIS — G9341 Metabolic encephalopathy: Secondary | ICD-10-CM | POA: Diagnosis not present

## 2022-05-18 LAB — RENAL FUNCTION PANEL
Albumin: 2.8 g/dL — ABNORMAL LOW (ref 3.5–5.0)
Anion gap: 13 (ref 5–15)
BUN: 37 mg/dL — ABNORMAL HIGH (ref 6–20)
CO2: 18 mmol/L — ABNORMAL LOW (ref 22–32)
Calcium: 8.7 mg/dL — ABNORMAL LOW (ref 8.9–10.3)
Chloride: 106 mmol/L (ref 98–111)
Creatinine, Ser: 1.58 mg/dL — ABNORMAL HIGH (ref 0.44–1.00)
GFR, Estimated: 38 mL/min — ABNORMAL LOW (ref >60)
Glucose, Bld: 112 mg/dL — ABNORMAL HIGH (ref 70–99)
Phosphorus: 3.8 mg/dL (ref 2.5–4.6)
Potassium: 4.1 mmol/L (ref 3.5–5.1)
Sodium: 137 mmol/L (ref 135–145)

## 2022-05-18 LAB — GLUCOSE, CAPILLARY
Glucose-Capillary: 123 mg/dL — ABNORMAL HIGH (ref 70–99)
Glucose-Capillary: 206 mg/dL — ABNORMAL HIGH (ref 70–99)

## 2022-05-18 MED ORDER — LIDOCAINE 5 % EX PTCH
1.0000 | MEDICATED_PATCH | CUTANEOUS | Status: DC
Start: 2022-05-18 — End: 2022-05-18
  Administered 2022-05-18: 1 via TRANSDERMAL

## 2022-05-18 MED ORDER — INSULIN ASPART 100 UNIT/ML IJ SOLN: 6.0000 [IU] | Freq: Three times a day (TID) | INTRAMUSCULAR | 11 refills | Status: DC

## 2022-05-18 MED ORDER — INSULIN GLARGINE-YFGN 100 UNIT/ML ~~LOC~~ SOLN: 20.0000 [IU] | Freq: Every day | SUBCUTANEOUS | 11 refills | Status: DC

## 2022-05-18 MED ORDER — SODIUM CHLORIDE 0.9 % IV BOLUS
1000.0000 mL | Freq: Once | INTRAVENOUS | Status: AC
  Administered 2022-05-18: 1000 mL via INTRAVENOUS

## 2022-05-18 MED ORDER — AMLODIPINE BESYLATE 10 MG PO TABS: 10.0000 mg | ORAL_TABLET | Freq: Every day | ORAL | Status: DC

## 2022-05-18 MED ORDER — TAPENTADOL HCL 75 MG PO TABS: 75.0000 mg | ORAL_TABLET | Freq: Two times a day (BID) | ORAL | 0 refills | Status: AC

## 2022-05-18 MED ORDER — AMOXICILLIN-POT CLAVULANATE 875-125 MG PO TABS: 1.0000 | ORAL_TABLET | Freq: Two times a day (BID) | ORAL | 0 refills | Status: AC

## 2022-05-18 MED ORDER — APIXABAN 5 MG PO TABS: 5.0000 mg | ORAL_TABLET | Freq: Two times a day (BID) | ORAL | Status: DC

## 2022-05-18 NOTE — Discharge Summary (Signed)
Physician Discharge Summary  Ercell Perlman JJH:[id number] DOB: [birth year] DOA: 04/17/2022  PCP: Alvira Monday, [name]  Admit date: 04/17/2022 Discharge date: 05/18/2022  Admitted From: Home Disposition:  SNF  Recommendations for Outpatient Follow-up:  Follow up with PCP in 1-2 weeks, repeat CT scan in 6 weeks. Please obtain BMP/CBC in one week   Home Health:No Equipment/Devices:None  Discharge Condition:Guarded CODE STATUS:Full Diet recommendation: Heart Healthy   Brief/Interim Summary: 56 y.o. 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  Family discharged on the hospital on 04/05/2022 for UTI and pneumonia treated with broad-spectrum antibiotics.  Admitted to the hospital for shortness of breath urinary frequency and altered mental status and renal function worsen so nephrology was consulted as she required temporary hemodialysis, nephrology eventually signed off as renal function improved and stabilized, also her mental status deteriorated neurology was consulted to perform an EEG which was unremarkable, LP was put on hold.  Had to be placed on heparin but had to be stopped as her hemoglobin dropped due to a gluteal hematoma, she was transfused now off her anticoagulation and her hemoglobin remained stable.  Discharge Diagnoses:  Principal Problem:   Acute metabolic encephalopathy Active Problems:   Acute cystitis without hematuria   Current chronic use of systemic steroids   Chronic pain   Secondary adrenal insufficiency (HCC)   AKI (acute kidney injury) ([place])   Granulomatosis with polyangiitis (HCC)   GERD (gastroesophageal reflux disease)   Essential hypertension   Obesity (BMI 30-39.9)   COVID-19 virus infection   Severe sepsis ([place])   Pulmonary embolism (HCC)    Transaminitis   Rhabdomyolysis   Acute deep vein thrombosis (DVT) of left lower extremity (HCC)  Acute blood loss anemia/anemia of chronic disease/gluteal hematoma: She was started on IV heparin on admission, heparin was stopped as her hemoglobin dropped she was transfused several units of packed red blood cells. General surgery was consulted and she was cleared by surgery to restart her heparin drip on 05/07/2022 but her hemoglobin dropped again. A CT scan of the abdomen and pelvis confirmed extension of his right gluteal hematoma. Heparin was kept off she is to restart [facility] on 06/01/2022 and reevaluate thereafter. Physical therapy evaluated the patient recommended short-term rehab.  Acute kidney injury/ATN which required HD: Temporary catheter was placed renal was consulted and she was dialyzed her creatinine returned to baseline. She will have to stay off ACE inhibitor for at least 2 weeks. She is now has signed off.  History of PE in June 2023/newly diagnosed DVT: After being rechallenged with heparin drip she bled and she has been off heparin since then. Patient refused SCDs as they cause her to have pain. Lower extremity Doppler was done that showed a new DVT. IR was consulted and a IVC filter was placed on 05/17/2022. She will be kept off anticoagulation until 06/01/2022, we can rechallenge her at that time.  Right upper lobe pneumonia: She reported some cough and shortness of breath she was started on Augmentin we will continue 7-day course as she is immunocompromised. We will need to repeat a CT scan in 6 weeks she is currently on room air.  Essential hypertension: Continue amlodipine.  Mild hyperkalemia: Resolved with hydration.  Hypomagnesemia: It was repleted now resolved.  Acute metabolic encephalopathy: MRI and EEG were negative likely multifactorial due to infectious etiology neurology was consulted recommended  no further work-up.  Chronic pain/neuropathic and chronic  opiate dependence: Complaining of low back pain subjective left greater than right, coincided with the use of SCDs. MRI of the lumbar spine show some stranding of the back muscles with a gluteal hematoma see above for further details. Her pain was controlled with her current management.  Granulomatosis with polyangiitis: Follow-up with rheumatology as an outpatient she is currently on Rituxan and methotrexate as an outpatient. She can resume her symmetric state as an outpatient when she completed her antibiotic regimen.  secondary adrenal insufficiency: No changes made continue steroids at home.  Acute respiratory failure with hypoxia present on admission: Now on room air.  Diabetes mellitus type 2: With a last A1c of 8.6, metformin was held on admission and will be resumed as an outpatient she will also go on Lantus and sliding scale insulin. Hospice and plan of care to follow-up with patient and family as an outpatient.  Obesity: Noted.  Disposition: Physical therapy evaluated the patient recommended skilled nursing facility   Discharge Instructions  Discharge Instructions     Diet - low sodium heart healthy   Complete by: As directed    Increase activity slowly   Complete by: As directed    No wound care   Complete by: As directed       Allergies as of 05/18/2022       Reactions   Ibuprofen Other (See Comments)   Pt is unable to take this due to kidney problems.     Penicillins Rash, Other (See Comments)   Has patient had a PCN reaction causing immediate rash, facial/tongue/throat swelling, SOB or lightheadedness with hypotension: No Has patient had a PCN reaction causing severe rash involving mucus membranes or skin necrosis: No Has patient had a PCN reaction that required hospitalization No Has patient had a PCN reaction occurring within the last 10 years: No If all of the above answers are "NO", then may proceed with Cephalosporin use.        Medication List      STOP taking these medications    cyanocobalamin 1000 MCG tablet Commonly known as: VITAMIN B12       TAKE these medications    acetaminophen 325 MG tablet Commonly known as: TYLENOL Take 2 tablets (650 mg total) by mouth every 6 (six) hours as needed for mild pain (or Fever >/= 101).   albuterol 108 (90 Base) MCG/ACT inhaler Commonly known as: VENTOLIN HFA Inhale 2 puffs into the lungs every 6 (six) hours as needed for wheezing or shortness of breath.   amitriptyline 50 MG tablet Commonly known as: ELAVIL Take 50 mg by mouth at bedtime.   amLODipine 10 MG tablet Commonly known as: NORVASC Take 1 tablet (10 mg total) by mouth daily.   amoxicillin-clavulanate 875-125 MG tablet Commonly known as: AUGMENTIN Take 1 tablet by mouth every 12 (twelve) hours for 3 days.   apixaban 5 MG Tabs tablet Commonly known as: ELIQUIS Take 1 tablet (5 mg total) by mouth 2 (two) times daily. Start taking on: June 01, 2022 What changed:  how much to take how to take this when to take this additional instructions These instructions start on June 01, 2022. If you are unsure what to do until then, ask your doctor or other care provider.   CALCIUM CARBONATE-VITAMIN D3 PO Take 1 tablet by mouth daily.   DULoxetine 60 MG capsule Commonly known as: CYMBALTA Take 60 mg by mouth 2 (two) times daily.  folic acid 1 MG tablet Commonly known as: FOLVITE Take 2 mg by mouth daily.   gabapentin 300 MG capsule Commonly known as: NEURONTIN Take 2 capsules (600 mg total) by mouth 3 (three) times daily. What changed: when to take this   guaiFENesin 600 MG 12 hr tablet Commonly known as: Mucinex Take 1 tablet (600 mg total) by mouth 2 (two) times daily.   insulin aspart 100 UNIT/ML injection Commonly known as: novoLOG Inject 6 Units into the skin 3 (three) times daily with meals.   insulin glargine-yfgn 100 UNIT/ML injection Commonly known as: SEMGLEE Inject 0.2 mLs (20 Units  total) into the skin at bedtime.   K-DUR PO Take 40 mEq by mouth every other day.   lidocaine 5 % Commonly known as: LIDODERM Place 1 patch onto the skin daily as needed (for pain). Remove & Discard patch within 12 hours or as directed by MD   linaclotide 145 MCG Caps capsule Commonly known as: LINZESS Take 145 mcg by mouth daily before breakfast.   magnesium oxide 400 MG tablet Commonly known as: MAG-OX Take 400 mg by mouth daily.   Melatonin 10 MG Tabs Take 10 mg by mouth at bedtime as needed (sleep).   metFORMIN 500 MG 24 hr tablet Commonly known as: GLUCOPHAGE-XR Take 500 mg by mouth daily with breakfast.   methocarbamol 500 MG tablet Commonly known as: ROBAXIN Take 500 mg by mouth every 6 (six) hours as needed for muscle spasms.   METHOTREXATE SODIUM IJ Inject 25 mg into the skin every Sunday.   naloxone 4 MG/0.1ML Liqd nasal spray kit Commonly known as: NARCAN Place 0.4 mg into the nose once.   pantoprazole 40 MG tablet Commonly known as: PROTONIX Take 40 mg by mouth daily.   predniSONE 10 MG tablet Commonly known as: DELTASONE Take 1 tab ($Remo'10mg'RrXAY$ ) daily for 2 days, then reduce to your home dose of $Remov'5mg'HGpvXw$  daily What changed:  how much to take how to take this when to take this additional instructions   rosuvastatin 10 MG tablet Commonly known as: CRESTOR Take 10 mg by mouth at bedtime.   senna 8.6 MG Tabs tablet Commonly known as: SENOKOT Take 2 tablets (17.2 mg total) by mouth 2 (two) times daily.   sulfamethoxazole-trimethoprim 400-80 MG tablet Commonly known as: BACTRIM Take 1 tablet by mouth 3 (three) times a week. Monday,Wednesday and Friday   tapentadol HCl 75 MG tablet Commonly known as: NUCYNTA Take 1 tablet (75 mg total) by mouth in the morning and at bedtime for 3 days.   topiramate 100 MG tablet Commonly known as: TOPAMAX Take 100 mg by mouth daily.   Vitamin D (Ergocalciferol) 1.25 MG (50000 UNIT) Caps capsule Commonly known as:  DRISDOL Take 50,000 Units by mouth every Friday.        Allergies  Allergen Reactions   Ibuprofen Other (See Comments)    Pt is unable to take this due to kidney problems.      Penicillins Rash and Other (See Comments)    Has patient had a PCN reaction causing immediate rash, facial/tongue/throat swelling, SOB or lightheadedness with hypotension: No Has patient had a PCN reaction causing severe rash involving mucus membranes or skin necrosis: No Has patient had a PCN reaction that required hospitalization No Has patient had a PCN reaction occurring within the last 10 years: No If all of the above answers are "NO", then may proceed with Cephalosporin use.    Consultations: Trauma service/general surgery Neurology Nephrology Infectious  disease Pulmonary and critical care   Procedures/Studies: DG Knee 1-2 Views Right  Result Date: 05/18/2022 CLINICAL DATA:  Right knee pain. EXAM: RIGHT KNEE - 1-2 VIEW COMPARISON:  Right knee radiographs 05/16/2022 FINDINGS: There appears to be mild lateral compartment joint space narrowing on frontal view, however this was not seen on recent 05/16/2022 radiographs and may be positional/projectional. Mild chronic enthesopathic spurring at the quadriceps and patellar insertions on the patella. No joint effusion. No acute fracture or dislocation. IMPRESSION: Mild chronic enthesopathic spurring at the quadriceps and patellar tendon insertions on the patella. Electronically Signed   By: [name] M.D.   On: 05/18/2022 09:41   IR IVC FILTER PLMT / S&I Burke Keels GUID/MOD SED  Result Date: 05/17/2022 INDICATION: History of PE and DVT, unable to tolerate anticoagulation EXAM: 1. Ultrasound-guided puncture of the right common femoral vein 2. Catheterization and venography of the IVC 3. Placement of an IVC filter using fluoroscopic guidance MEDICATIONS: Fentanyl 75 mcg IV Omnipaque 300 35 mL administered intravenously ANESTHESIA/SEDATION: Local analgesia  FLUOROSCOPY: Radiation Exposure Index (as provided by the fluoroscopic device): 1.4 minutes (29 mGy) COMPLICATIONS: None immediate. PROCEDURE: Informed written consent was obtained from the patient after a thorough discussion of the procedural risks, benefits and alternatives. All questions were addressed. Maximal Sterile Barrier Technique was utilized including caps, mask, sterile gowns, sterile gloves, sterile drape, hand hygiene and skin antiseptic. A timeout was performed prior to the initiation of the procedure. The right groin and draped in the standard sterile fashion. Ultrasound was used to evaluate the right common femoral vein, which was found to be widely patent. Under ultrasound guidance, the right common femoral vein was directly punctured using a 21 gauge micropuncture set. Wire was advanced centrally into the IVC, and the tract was serially dilated to accommodate the introducer sheath for the IVC filter. The introducer sheath of a Denali IVC filter with its flush inner dilator was then advanced over the wire to the iliocaval confluence. Angiography of the inferior vena cava was then performed. This demonstrates a normal caliber IVC which is widely patent without thrombus. Position of the inferior-most renal veins was noted. Under careful fluoroscopic guidance, a Denali IVC filter was deployed in an infrarenal location. Proper deployment was confirmed with radiography. At the end the procedure, all catheters and wires were removed. Hemostasis was achieved at the access site using manual pressure. A sterile dressing was placed. The patient tolerated the procedure well without immediate complication. IMPRESSION: Successful placement of a Denali IVC filter in an infrarenal location. PLAN: This IVC filter is potentially retrievable. The patient will be assessed for filter retrieval by Interventional Radiology in approximately 8-12 weeks. Further recommendations regarding filter retrieval, continued  surveillance or declaration of device permanence, will be made at that time. Electronically Signed   By: Albin Felling M.D.   On: 05/17/2022 16:03   VAS [place] LOWER EXTREMITY VENOUS (DVT)  Result Date: 05/16/2022  Lower Venous DVT Study Patient Name:  VANNARY GREENING  Date of Exam:   05/16/2022 Medical Rec #: [id number]    Accession #:    [id number] Date of Birth: 04-21-1966    Patient Gender: F Patient Age:   10 years Exam Location:  [facility] Procedure:      VAS [place] LOWER EXTREMITY VENOUS (DVT) Referring Phys: Vance Gather --------------------------------------------------------------------------------  Indications: Swelling. Other Indications: Patient unable to be anticoagulated at this time to abnormal  Hb levels. Risk Factors: PE diagnosed 03/27/22. Limitations: Poor ultrasound/tissue interface. Comparison Study: Previous exam on 05/10/22 was negative for DVT. Performing Technologist: Rogelia Rohrer RVT, RDMS  Examination Guidelines: A complete evaluation includes B-mode imaging, spectral Doppler, color Doppler, and power Doppler as needed of all accessible portions of each vessel. Bilateral testing is considered an integral part of a complete examination. Limited examinations for reoccurring indications may be performed as noted. The reflux portion of the exam is performed with the patient in reverse Trendelenburg.  +---------+---------------+---------+-----------+----------+--------------+ RIGHT    CompressibilityPhasicitySpontaneityPropertiesThrombus Aging +---------+---------------+---------+-----------+----------+--------------+ CFV      Full           Yes      Yes                                 +---------+---------------+---------+-----------+----------+--------------+ SFJ      Full                                                        +---------+---------------+---------+-----------+----------+--------------+ FV Prox  Full           Yes      Yes                                  +---------+---------------+---------+-----------+----------+--------------+ FV Mid   Full           Yes      Yes                                 +---------+---------------+---------+-----------+----------+--------------+ FV DistalFull           Yes      Yes                                 +---------+---------------+---------+-----------+----------+--------------+ PFV      Full                                                        +---------+---------------+---------+-----------+----------+--------------+ POP      Full           Yes      Yes                                 +---------+---------------+---------+-----------+----------+--------------+ PTV      Full                                                        +---------+---------------+---------+-----------+----------+--------------+ PERO     Full                                                        +---------+---------------+---------+-----------+----------+--------------+   +---------+---------------+---------+-----------+----------+-----------------+  LEFT     CompressibilityPhasicitySpontaneityPropertiesThrombus Aging    +---------+---------------+---------+-----------+----------+-----------------+ CFV      Full           Yes      Yes                                    +---------+---------------+---------+-----------+----------+-----------------+ SFJ      Full                                                           +---------+---------------+---------+-----------+----------+-----------------+ FV Prox  Partial        Yes      Yes                  Age Indeterminate +---------+---------------+---------+-----------+----------+-----------------+ FV Mid   Full           Yes      Yes                                    +---------+---------------+---------+-----------+----------+-----------------+ FV DistalFull           Yes      Yes                                     +---------+---------------+---------+-----------+----------+-----------------+ PFV      Full                                                           +---------+---------------+---------+-----------+----------+-----------------+ POP      Full           Yes      Yes                                    +---------+---------------+---------+-----------+----------+-----------------+ PTV      Full                                                           +---------+---------------+---------+-----------+----------+-----------------+ PERO     Full                                                           +---------+---------------+---------+-----------+----------+-----------------+     Summary: BILATERAL: -No evidence of popliteal cyst, bilaterally. RIGHT: - There is no evidence of deep vein thrombosis in the lower extremity.  LEFT: - Findings consistent with non occlusive, age indeterminate deep vein thrombosis involving the proximal portion of the left femoral vein.  *See table(s) above for measurements  and observations. Electronically signed by Deitra Mayo MD on 05/16/2022 at 4:23:20 PM.    Final    DG Knee Left Port  Result Date: 05/16/2022 CLINICAL DATA:  Acute bilateral knee pain EXAM: PORTABLE LEFT KNEE - 1-2 VIEW; PORTABLE RIGHT KNEE - 1-2 VIEW COMPARISON:  Radiographs 07/09/2016 FINDINGS: No evidence of fracture, dislocation, or joint effusion. No evidence of arthropathy or other focal bone abnormality. Right patellar enthesophytes. Soft tissues are unremarkable. IMPRESSION: Negative bilateral knee radiographs. Electronically Signed   By: Placido Sou M.D.   On: 05/16/2022 09:56   DG Knee Right Port  Result Date: 05/16/2022 CLINICAL DATA:  Acute bilateral knee pain EXAM: PORTABLE LEFT KNEE - 1-2 VIEW; PORTABLE RIGHT KNEE - 1-2 VIEW COMPARISON:  Radiographs 07/09/2016 FINDINGS: No evidence of fracture, dislocation, or joint effusion. No evidence of arthropathy or other  focal bone abnormality. Right patellar enthesophytes. Soft tissues are unremarkable. IMPRESSION: Negative bilateral knee radiographs. Electronically Signed   By: Placido Sou M.D.   On: 05/16/2022 09:56   DG CHEST PORT 1 VIEW  Result Date: 05/14/2022 CLINICAL DATA:  Cough.  Hemoptysis. EXAM: PORTABLE CHEST 1 VIEW COMPARISON:  04/17/2022 and older studies.  CT, 03/27/2022. FINDINGS: There is patchy opacity in the medial right upper lobe. Remainder of the lungs is clear. Lung volumes are low. No convincing pleural effusion.  No pneumothorax. Cardiac silhouette is normal in size. No mediastinal or hilar masses. Skeletal structures are grossly intact. IMPRESSION: 1. Patchy airspace opacity in the medial right upper lobe consistent with pneumonia. Electronically Signed   By: Lajean Manes M.D.   On: 05/14/2022 12:52   VAS [place] LOWER EXTREMITY VENOUS (DVT)  Result Date: 05/10/2022  Lower Venous DVT Study Patient Name:  CALLI BASHOR  Date of Exam:   05/10/2022 Medical Rec #: [id number]    Accession #:    [id number] Date of Birth: 01/12/1966    Patient Gender: F Patient Age:   6 years Exam Location:  [facility] Procedure:      VAS [place] LOWER EXTREMITY VENOUS (DVT) Referring Phys: Gerlean Ren --------------------------------------------------------------------------------  Indications: Pulmonary embolism.  Risk Factors: Confirmed PE. Anticoagulation: Heparin. Comparison Study: No prior studies. Performing Technologist: Oliver Hum RVT  Examination Guidelines: A complete evaluation includes B-mode imaging, spectral Doppler, color Doppler, and power Doppler as needed of all accessible portions of each vessel. Bilateral testing is considered an integral part of a complete examination. Limited examinations for reoccurring indications may be performed as noted. The reflux portion of the exam is performed with the patient in reverse Trendelenburg.   +---------+---------------+---------+-----------+----------+--------------+ RIGHT    CompressibilityPhasicitySpontaneityPropertiesThrombus Aging +---------+---------------+---------+-----------+----------+--------------+ CFV      Full           Yes      Yes                                 +---------+---------------+---------+-----------+----------+--------------+ SFJ      Full                                                        +---------+---------------+---------+-----------+----------+--------------+ FV Prox  Full                                                        +---------+---------------+---------+-----------+----------+--------------+  FV Mid   Full                                                        +---------+---------------+---------+-----------+----------+--------------+ FV DistalFull                                                        +---------+---------------+---------+-----------+----------+--------------+ PFV      Full                                                        +---------+---------------+---------+-----------+----------+--------------+ POP      Full           Yes      Yes                                 +---------+---------------+---------+-----------+----------+--------------+ PTV      Full                                                        +---------+---------------+---------+-----------+----------+--------------+ PERO     Full                                                        +---------+---------------+---------+-----------+----------+--------------+   +---------+---------------+---------+-----------+----------+--------------+ LEFT     CompressibilityPhasicitySpontaneityPropertiesThrombus Aging +---------+---------------+---------+-----------+----------+--------------+ CFV      Full           Yes      Yes                                  +---------+---------------+---------+-----------+----------+--------------+ SFJ      Full                                                        +---------+---------------+---------+-----------+----------+--------------+ FV Prox  Full                                                        +---------+---------------+---------+-----------+----------+--------------+ FV Mid   Full                                                        +---------+---------------+---------+-----------+----------+--------------+  FV DistalFull                                                        +---------+---------------+---------+-----------+----------+--------------+ PFV      Full                                                        +---------+---------------+---------+-----------+----------+--------------+ POP      Full           Yes      Yes                                 +---------+---------------+---------+-----------+----------+--------------+ PTV      Full                                                        +---------+---------------+---------+-----------+----------+--------------+ PERO     Full                                                        +---------+---------------+---------+-----------+----------+--------------+     Summary: RIGHT: - There is no evidence of deep vein thrombosis in the lower extremity.  - No cystic structure found in the popliteal fossa.  LEFT: - There is no evidence of deep vein thrombosis in the lower extremity.  - No cystic structure found in the popliteal fossa.  *See table(s) above for measurements and observations. Electronically signed by Monica Martinez MD on 05/10/2022 at 2:54:25 PM.    Final    MR LUMBAR SPINE WO CONTRAST  Result Date: 05/10/2022 CLINICAL DATA:  Low back pain, infection suspected. Positive x-ray/CT with myelopathy. EXAM: MRI LUMBAR SPINE WITHOUT CONTRAST TECHNIQUE: Multiplanar, multisequence MR imaging of the  lumbar spine was performed. No intravenous contrast was administered. COMPARISON:  Abdominal CT from earlier today FINDINGS: Segmentation:  5 lumbar type vertebrae Alignment:  Degenerative anterolisthesis at L4-5 Vertebrae:  No fracture, evidence of discitis, or bone lesion. Conus medullaris and cauda equina: Conus extends to the L1-2 level. Conus and cauda equina appear normal. Paraspinal and other soft tissues: There is T2 hyperintensity and mild swelling of right more than left intrinsic back muscles with T1 and T2 hyperintense cyst-like areas with hypointense rim clustered in the superficial right musculature at the level of L2 and L3, individual components measuring up to 11 mm. Similar signal characteristics involve the known right gluteal and piriformis hematoma which is partially covered. Spinal catheter seen entering the canal posteriorly at L1-2. Disc levels: Degeneration primarily affects the L4-5 facets where there is spurring and right-sided joint effusion with anterolisthesis. No neural impingement. IMPRESSION: *Strain of intrinsic back muscles on the right more than left with small volume right-sided intramuscular hemorrhage. Known, extensive hemorrhage in the right gluteal and piriformis musculature which is minimally  covered. 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 *No neural impingement. Electronically Signed   By: Jorje Guild M.D.   On: 05/10/2022 13:03   CT ABDOMEN PELVIS WO CONTRAST  Result Date: 05/10/2022 CLINICAL DATA:  Postoperative anemia. Abdominal pain. EXAM: CT ABDOMEN AND PELVIS WITHOUT CONTRAST TECHNIQUE: Multidetector CT imaging of the abdomen and pelvis was performed following the standard protocol without IV contrast. RADIATION DOSE REDUCTION: This exam was performed according to the departmental dose-optimization program which includes automated exposure control, adjustment of the mA and/or kV according to patient size  and/or use of iterative reconstruction technique. COMPARISON:  April 30, 2022 FINDINGS: Lower chest: Mild subpleural ground-glass and nodular opacities in bilateral lung bases. Hepatobiliary: No focal liver abnormality is seen. Status post cholecystectomy. No biliary dilatation. Pancreas: Unremarkable. No pancreatic ductal dilatation or surrounding inflammatory changes. Spleen: Surgical clips are seen in the splenic hilum. Stable configuration of the spleen versus splenules. Adrenals/Urinary Tract: Adrenal glands are unremarkable. Kidneys are normal, without renal calculi, focal lesion, or hydronephrosis. Mild nonspecific bilateral perinephric stranding. Bladder is unremarkable. Stomach/Bowel: Stomach is within normal limits. Post appendectomy. No evidence of bowel wall thickening, distention, or inflammatory changes. Colonic diverticulosis without evidence of diverticulitis. Vascular/Lymphatic: Aortic atherosclerosis. No enlarged abdominal or pelvic lymph nodes. Reproductive: Status post hysterectomy. No adnexal masses. Other: Persistent presacral edema. No evidence of retroperitoneal hematoma. Thoracic stimulator device is in stable position with generator in the left anterior abdominal wall. Musculoskeletal: Diffuse body wall edema. The known right gluteal and piriformis intramuscular hematoma has slightly increased in size and now measures 15.0 by 8.7 by 16.7 cm. Mild spondylosis of the lumbosacral spine. IMPRESSION: 1. Slight interval increase in size of the known right gluteal and piriformis intramuscular hematoma now measuring 16.7 cm in greatest dimension. 2. No evidence of retroperitoneal hematoma. 3. Colonic diverticulosis without evidence of diverticulitis. 4. Mild subpleural ground-glass and nodular opacities in bilateral lung bases, which may be due to an infectious or inflammatory process. 5. Diffuse body wall edema. Aortic Atherosclerosis (ICD10-I70.0). Electronically Signed   By: Fidela Salisbury  M.D.   On: 05/10/2022 11:19   CT ABDOMEN PELVIS WO CONTRAST  Result Date: 04/30/2022 CLINICAL DATA:  Retroperitoneal hematoma. EXAM: CT ABDOMEN AND PELVIS WITHOUT CONTRAST TECHNIQUE: Multidetector CT imaging of the abdomen and pelvis was performed following the standard protocol without IV contrast. RADIATION DOSE REDUCTION: This exam was performed according to the departmental dose-optimization program which includes automated exposure control, adjustment of the mA and/or kV according to patient size and/or use of iterative reconstruction technique. COMPARISON:  Renal ultrasound 04/18/2022. CT chest abdomen and pelvis 07/09/2016. FINDINGS: Lower chest: There is some atelectasis in the bilateral lung bases. There is a trace right pleural effusion. Hepatobiliary: No focal liver abnormality is seen. Status post cholecystectomy. No biliary dilatation. Pancreas: Unremarkable. No pancreatic ductal dilatation or surrounding inflammatory changes. Spleen: Normal in size without focal abnormality. Adrenals/Urinary Tract: The kidneys and adrenal glands are within normal limits. The bladder is completely decompressed by Foley catheter. Stomach/Bowel: Stomach is within normal limits. No evidence of bowel wall thickening, distention, or inflammatory changes. The appendix is not seen. There is descending and sigmoid colon diverticulosis. Rectal tube/catheter in place. Vascular/Lymphatic: Aortic atherosclerosis. No enlarged abdominal or pelvic lymph nodes. Reproductive: Status post hysterectomy. No adnexal masses. Other: Presacral edema is present. There is no ascites. There is no retroperitoneal hematoma. There is diffuse body wall edema. No focal abdominal wall hernia. Thoracic catheter/stimulator device is present  with generator in the left anterior abdominal wall, unchanged. Musculoskeletal: No acute osseous findings. There is heterogeneous hyperdensity expanding the right gluteus minimus and piriformis musculature  measuring 7.4 x 13.8 x 14.1 cm in largest dimensions worrisome for intramuscular hematoma. IMPRESSION: 1. Heterogeneous hyperdensity expanding the right gluteus minimus and piriformis musculature worrisome for intramuscular hematoma. 2. No retroperitoneal hematoma. 3. Diffuse body wall edema and presacral edema. 4. Trace right pleural effusion with bibasilar atelectasis. 5. Colonic diverticulosis without evidence for acute diverticulitis. 6. Foley catheter. 7.  Aortic Atherosclerosis (ICD10-I70.0). Electronically Signed   By: Ronney Asters M.D.   On: 04/30/2022 16:29   DG Swallowing Func-Speech Pathology  Result Date: 04/27/2022 Table formatting from the original result was not included. Objective Swallowing Evaluation: Type of Study: MBS-Modified Barium Swallow Study  Patient Details Name: Abrie Egloff MRN: [id number] Date of Birth: [birth year] Today's Date: 04/27/2022 Time: SLP Start Time (ACUTE ONLY): 31 -SLP Stop Time (ACUTE ONLY): 1250 SLP Time Calculation (min) (ACUTE ONLY): 20 min Past Medical History: Past Medical History: Diagnosis Date  Chronic cough   Chronic pain   Diabetes mellitus without complication (HCC)   Dyspnea   GERD (gastroesophageal reflux disease)   Hypokalemia   Hypothyroidism   Left wrist pain 06/01/2021  Myonecrosis ([place]) 07/01/2021  Neurocardiogenic syncope   OSA (obstructive sleep apnea)   does not use CPAP  Osteomyelitis (HCC)   bilateral feet  Peripheral neuropathy   Presence of intrathecal pump   recieves Prialt/bupivicaine  Tear of gluteus medius tendon 07/01/2021  Wegener's granulomatosis 2009  Wegner's disease (congenital syphilitic osteochondritis)  Past Surgical History: Past Surgical History: Procedure Laterality Date  AMPUTATION Right 10/21/2020  Procedure: Right hallux amputation;  Surgeon: Wylene Simmer, MD;  Location: [place];  Service: Orthopedics;  Laterality: Right;  AMPUTATION TOE Bilateral 08/12/2020  Procedure: Right 3rd toe amputation; left hallux and 3rd  toe amputation;  Surgeon: Wylene Simmer, MD;  Location: [place];  Service: Orthopedics;  Laterality: Bilateral;  10min  AMPUTATION TOE Right 03/24/2021  Procedure: AMPUTATION TOE;  Surgeon: Wylene Simmer, MD;  Location: [place];  Service: Orthopedics;  Laterality: Right;  APPENDECTOMY    BACK SURGERY    BIOPSY  03/30/2022  Procedure: BIOPSY;  Surgeon: Laurin Coder, MD;  Location: [place];  Service: Pulmonary;;  BRONCHIAL WASHINGS  03/30/2022  Procedure: BRONCHIAL WASHINGS;  Surgeon: Laurin Coder, MD;  Location: [place];  Service: Pulmonary;;  CHOLECYSTECTOMY    COLONOSCOPY N/A 06/02/2013  Procedure: COLONOSCOPY;  Surgeon: Wonda Horner, MD;  [facility];  Service: Endoscopy;  Laterality: N/A;  HERNIA REPAIR  2000  INCISION AND DRAINAGE ABSCESS Left 07/07/2016  Procedure: DEBRIDMENT LEFT THIGH ABSCESS, EXISION ACUTE SKIN RASH LEFT THIGH(1CM LESION);  Surgeon: Fanny Skates, MD;  Location: [facility];  Service: General;  Laterality: Left;  IR FLUORO GUIDE CV LINE RIGHT  04/24/2022  IR US GUIDE VASC ACCESS RIGHT  04/24/2022  SEPTOPLASTY  02/2013  spleen anuyism    splenic aneurysm    TRANSMETATARSAL AMPUTATION Bilateral 10/21/2020  Procedure: Left transmetatarsal amputation;  Surgeon: Wylene Simmer, MD;  Location: [place];  Service: Orthopedics;  Laterality: Bilateral;  TRANSMETATARSAL AMPUTATION Left 03/24/2021  Procedure: TRANSMETATARSAL AMPUTATION;  Surgeon: Wylene Simmer, MD;  Location: [place];  Service: Orthopedics;  Laterality: Left;  VAGINAL HYSTERECTOMY    VIDEO BRONCHOSCOPY Right 03/30/2022  Procedure: VIDEO BRONCHOSCOPY WITH FLUORO;  Surgeon: Laurin Coder, MD;  Location: [place] [facility];  Service: Pulmonary;  Laterality:  Right;  atypical pneumonia HPI: 56 y.o. female with medical history significant of Wegener's angiitis, chronic opioid dependency, hyperlipidemia, hypertension, impaired glucose tolerance, adrenal insufficiency on chronic prednisone usage,  rheumatoid arthritis and multiple admissions secondary to sepsis in the setting of pneumonia and UTI.  Presented to the hospital [facility]) on 04/17/22 with complaints of intermittent altered mental status changes at home and increased weakness.  Symptoms have been present for the last 2-3 days and worsening according to patient's husband.  Patient has just completed her antibiotic therapy from most recent admission about 1 week prior to presentation.  In the ED patient met criteria for severe sepsis with elevated WBCs, elevated lactic acid in the 4.7 range, acute renal failure as organ dysfunction and was also tachycardic and tachypneic;Subsequently found to be Covid+; transferred to [facility] on 04/22/22; ST evaluation on 04/21/22 indicating need for Dysphagia 2/honey-thickened liquids: CXR on 04/17/22 indicated Progressive interstitial pattern left lung and right upper lobe.  This may represent edema or infection; ST f/u for potential objective assessment/diet tolerance.  Subjective: pleasant, cooperative  Recommendations for follow up therapy are one component of a multi-disciplinary discharge planning process, led by the attending physician.  Recommendations may be updated based on patient status, additional functional criteria and insurance authorization. Assessment / Plan / Recommendation   04/27/2022   1:24 PM Clinical Impressions Clinical Impression Patient presents with a mild oropharyngeal dysphagia as per this MBS. During oral phase, the only difficulty she had was mastication of regular solids and mildly delayed oral transit of bolus. She was not able to maintain bolus with 32m barium tablet when attempted with thin liquids and then with puree solids and patient had to spit tablet out both times. No oral phase impairments observed with puree solids, thin liquids or nectar thick liquids. During pharyngeal phase, she exhibited swallow initiation to level of vallecular sinus with regular solids puree solids,  nectar thick liquids and swallow initiation delay to level of pyriform sinus with thin liquids. She exhibited silent aspiration during the swallow (PAS 8) with thin liquids of trace amount. Aspiration did not occur with each swallow of thin liquids, however deep penetration did occur with majority of swallows of thin liquids. No significant pharyngeal residuals observed post swallows with any of the tested consistencies. Cervical esophageal phase appeared [facility] S[facility] Visit Diagnosis Dysphagia, oropharyngeal phase (R13.12) Impact on safety and function Mild aspiration risk;Moderate aspiration risk     04/27/2022   1:24 PM Treatment Recommendations Treatment Recommendations Therapy as outlined in treatment plan below     04/27/2022   1:30 PM Prognosis Prognosis for Safe Diet Advancement Good Barriers to Reach Goals Severity of deficits;Time post onset   04/27/2022   1:24 PM Diet Recommendations SLP Diet Recommendations Dysphagia 3 (Mech soft) solids;nectar thick liquid Liquid Administration via Straw;Cup Medication Administration Whole meds with puree Compensations Slow rate;Small sips/bites Postural Changes Seated upright at 90 degrees     04/27/2022   1:24 PM Other Recommendations Oral Care Recommendations Oral care BID Other Recommendations Order thickener from pharmacy;Remove water pitcher;Clarify dietary restrictions;Prohibited food (jello, ice cream, thin soups) Follow Up Recommendations Follow physician's recommendations for discharge plan and follow up therapies Assistance recommended at discharge Frequent or constant Supervision/Assistance   04/27/2022   1:24 PM Frequency and Duration  Speech Therapy Frequency (ACUTE ONLY) min 2x/week     04/27/2022   1:23 PM Oral Phase Oral Phase Impaired Oral - Regular Impaired mastication;Reduced posterior propulsion Oral - Pill Decreased bolus cohesion  04/27/2022   1:23 PM Pharyngeal Phase Pharyngeal Phase Impaired Pharyngeal- Nectar Cup Delayed swallow initiation-vallecula Pharyngeal-  Thin Cup Delayed swallow initiation-pyriform sinuses;Reduced airway/laryngeal closure;Penetration/Aspiration during swallow;Trace aspiration Pharyngeal Material enters airway, passes BELOW cords without attempt by patient to eject out (silent aspiration) Pharyngeal- Puree Delayed swallow initiation-vallecula Pharyngeal- Regular Delayed swallow initiation-vallecula Pharyngeal- Pill NT    04/27/2022   1:24 PM Cervical Esophageal Phase  Cervical Esophageal Phase [facility] Sonia Baller, MA, CCC-SLP Speech Therapy                     IR Fluoro Guide CV Line Right  Result Date: 04/24/2022 INDICATION: 56 year old female in need of hemodialysis EXAM: IR ULTRASOUND GUIDANCE VASC ACCESS RIGHT; IR RIGHT FLUORO GUIDE CV LINE MEDICATIONS: None ANESTHESIA/SEDATION: None FLUOROSCOPY TIME:  Radiation Exposure Index: 2 mGy reference air Kerma COMPLICATIONS: None immediate. PROCEDURE: Informed written consent was obtained from the patient after a thorough discussion of the procedural risks, benefits and alternatives. All questions were addressed. Maximal Sterile Barrier Technique was utilized including caps, mask, sterile gowns, sterile gloves, sterile drape, hand hygiene and skin antiseptic. A timeout was performed prior to the initiation of the procedure. The RIGHT internal jugular vein was interrogated with ultrasound and found to be widely patent. An image was obtained and stored for the medical record. Local anesthesia was attained by infiltration with 1% lidocaine. A small dermatotomy was made. Under real-time sonographic guidance, the vessel was punctured with a 21 gauge micropuncture needle. Using standard technique, the initial micro needle was exchanged over a 0.018 micro wire for a transitional 4 [place] micro sheath. The micro sheath was then exchanged over a 0.035 wire for a fascial dilator and the tract was dilated. A 16 cm non-tunneled hemodialysis catheter was then placed over the wire and into the upper RA. Images were  obtained and stored for the medical record. The catheter flushes and aspirates easily. It was flushed, capped and secured to the skin with 0 Prolene suture. Sterile bandages were applied. IMPRESSION: Successful placement of a 16 cm right IJ approach non tunneled hemodialysis catheter. Catheter tip is in the upper right atrium and the device is ready for immediate use. Signed, Criselda Peaches, MD, [name] Radiology Electronically Signed   By: Jacqulynn Cadet M.D.   On: 04/24/2022 17:58   IR US Guide Vasc Access Right  Result Date: 04/24/2022 INDICATION: 56 year old female in need of hemodialysis EXAM: IR ULTRASOUND GUIDANCE VASC ACCESS RIGHT; IR RIGHT FLUORO GUIDE CV LINE MEDICATIONS: None ANESTHESIA/SEDATION: None FLUOROSCOPY TIME:  Radiation Exposure Index: 2 mGy reference air Kerma COMPLICATIONS: None immediate. PROCEDURE: Informed written consent was obtained from the patient after a thorough discussion of the procedural risks, benefits and alternatives. All questions were addressed. Maximal Sterile Barrier Technique was utilized including caps, mask, sterile gowns, sterile gloves, sterile drape, hand hygiene and skin antiseptic. A timeout was performed prior to the initiation of the procedure. The RIGHT internal jugular vein was interrogated with ultrasound and found to be widely patent. An image was obtained and stored for the medical record. Local anesthesia was attained by infiltration with 1% lidocaine. A small dermatotomy was made. Under real-time sonographic guidance, the vessel was punctured with a 21 gauge micropuncture needle. Using standard technique, the initial micro needle was exchanged over a 0.018 micro wire for a transitional 4 [place] micro sheath. The micro sheath was then exchanged over a 0.035 wire for a fascial dilator and the tract  was dilated. A 16 cm non-tunneled hemodialysis catheter was then placed over the wire and into  the upper RA. Images were obtained and stored for the medical record. The catheter flushes and aspirates easily. It was flushed, capped and secured to the skin with 0 Prolene suture. Sterile bandages were applied. IMPRESSION: Successful placement of a 16 cm right IJ approach non tunneled hemodialysis catheter. Catheter tip is in the upper right atrium and the device is ready for immediate use. Signed, Criselda Peaches, MD, [place] [facility] Radiology Electronically Signed   By: Jacqulynn Cadet M.D.   On: 04/24/2022 17:58   MR BRAIN WO CONTRAST  Result Date: 04/23/2022 CLINICAL DATA:  Mental status changes. Unknown cause. EXAM: MRI HEAD WITHOUT CONTRAST TECHNIQUE: Multiplanar, multiecho pulse sequences of the brain and surrounding structures were obtained without intravenous contrast. COMPARISON:  CT head 04/17/2022 and 03/26/2022. FINDINGS: Brain: The study is moderately degraded by patient motion. Propeller sequences were utilized. 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 Dilated perivascular spaces are present throughout the basal ganglia. No acute infarct, hemorrhage, or mass lesion is present. White matter changes extend into the thalamus and brainstem. Cerebellum is within normal limits. The internal auditory canals are within normal limits. The ventricles are of normal size. No significant extraaxial fluid collection is present. Vascular: Flow is present in the major intracranial arteries. Skull and upper cervical spine: The craniocervical junction is within normal limits. Marrow signal is unremarkable. No focal lesions are evident. Sinuses/Orbits: Mild mucosal thickening is present in the posteroinferior maxillary sinuses bilaterally. A left mastoid effusion is present. No obstructing nasopharyngeal lesion is present. The paranasal sinuses and mastoid air cells are otherwise clear.  IMPRESSION: 1. No acute intracranial abnormality. 2. 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 3. Moderate diffuse white matter disease likely reflects the sequela of chronic microvascular ischemia. 4. Left mastoid effusion. No obstructing nasopharyngeal lesion is present. Electronically Signed   By: San Morelle M.D.   On: 04/23/2022 14:52   EEG adult  Result Date: 04/20/2022 Lora Havens, MD     04/20/2022  8:54 PM Patient Name: Luverne Farone MRN: [id number] Epilepsy Attending: Lora Havens Referring Physician/Provider: Orson Eva, MD Date: 04/20/2022 Duration: 24.13 mins Patient history: 56yo F with ams. EEG to evaluate for seizure. Level of alertness: Awake AEDs during EEG study: None Technical aspects: This EEG study was done with scalp electrodes positioned according to the 10-20 International system of electrode placement. Electrical activity was acquired at a sampling rate of 500Hz and reviewed with a high frequency filter of 70Hz and a low frequency filter of 1Hz. EEG data were recorded continuously and digitally stored. Description: No clear posterior dominant rhythm was seen. EEG showed continuous generalized predominantly 5-7 Hz theta slowing admixed with intermittent generalized 2-3hz delta slowing. Patient was noted to have brief intermittent whole body twitching as well as asynchronous and non- rhythmic twitching of arms and legs. Concomitant EEG before, during and after the event did not show any EEG changes suggest seizure. Hyperventilation and photic stimulation were not performed.   ABNORMALITY - Continuous slow, generalized IMPRESSION: This study is suggestive of moderate diffuse encephalopathy, nonspecific etiology. No seizures or epileptiform discharges were seen throughout the recording. Patient was noted to have brief intermittent whole body twitching as well as asynchronous and non- rhythmic twitching of  arms and  legs without concomitant EEG change. These episodes were NOT epileptic. Priyanka O Yadav   US RENAL  Result Date: 04/18/2022 CLINICAL DATA:  Renal failure EXAM: RENAL / URINARY TRACT ULTRASOUND COMPLETE COMPARISON:  CT 07/09/2016 FINDINGS: Right Kidney: Renal measurements: 10.3 x 6.4 x 6.0 cm = volume: 207 mL. Echogenicity within normal limits. No mass or hydronephrosis visualized. Left Kidney: Renal measurements: 10.9 x 6.2 x 4.7 cm = volume: 166 mL. Echogenicity within normal limits. No mass or hydronephrosis visualized. Bladder: Decompressed by Foley catheter. Other: None. IMPRESSION: No evidence of obstructive uropathy. Electronically Signed   By: Davina Poke D.O.   On: 04/18/2022 14:05   (Echo, Carotid, EGD, Colonoscopy, ERCP)    Subjective: No complaints.  Discharge Exam: Vitals:   05/18/22 0731 05/18/22 0800  BP: (!) 148/89   Pulse: 85   Resp: 15 16  Temp: 97.8 F (36.6 C)   SpO2: 98%    Vitals:   05/17/22 2324 05/18/22 0437 05/18/22 0731 05/18/22 0800  BP: (!) 142/85 140/85 (!) 148/89   Pulse: (!) 101 92 85   Resp: _0 Temp: 98.4 F (36.9 C) 98 F (36.7 C) 97.8 F (36.6 C)   TempSrc: Oral Oral Oral   SpO2: 94% 94% 98%   Weight:      Height:        General: Pt is alert, awake, not in acute distress Cardiovascular: RRR, S1/S2 +, no rubs, no gallops Respiratory: CTA bilaterally, no wheezing, no rhonchi Abdominal: Soft, NT, ND, bowel sounds + Extremities: no edema, no cyanosis    The results of significant diagnostics from this hospitalization (including imaging, microbiology, ancillary and laboratory) are listed below for reference.     Microbiology: No results found for this or any previous visit (from the past 240 hour(s)).   Labs: BNP (last 3 results) Recent Labs    03/31/22 0751 04/01/22 0345 04/02/22 0415  BNP 41.9 52.3 11.5   Basic Metabolic Panel: Recent Labs  Lab 05/12/22 0605 05/13/22 0812 05/14/22 0356  05/15/22 0641 05/16/22 0332 05/17/22 0349 05/18/22 0241  NA 144 139  139 139 141 140 136 137  K 4.8 4.2  4.1 4.5 4.6 4.6 4.1 4.1  CL 115* 109  109 110 111 110 105 106  CO2 22 21*  21* 21* 22 19* 21* 18*  GLUCOSE 134* 92  94 223* 175* 114* 119* 112*  BUN 52* 41*  42* 40* 39* 39* 34* 37*  CREATININE 1.52* 1.53*  1.55* 1.59* 1.38* 1.37* 1.20* 1.58*  CALCIUM 8.7* 8.3*  8.3* 8.2* 8.5* 8.4* 8.8* 8.7*  MG 1.5* 1.5* 1.9 1.5*  --  1.3*  --   PHOS 3.5 4.0 4.0 4.1 3.6 4.5 3.8   Liver Function Tests: Recent Labs  Lab 05/14/22 0356 05/15/22 0641 05/16/22 0332 05/17/22 0349 05/18/22 0241  ALBUMIN 2.6* 2.7* 2.7* 2.8* 2.8*   No results for input(s): "LIPASE", "AMYLASE" in the last 168 hours. No results for input(s): "AMMONIA" in the last 168 hours. CBC: Recent Labs  Lab 05/11/22 2156 05/12/22 0605 05/13/22 0812 05/14/22 0356 05/15/22 0641  WBC  --  11.0* 10.3 9.6 9.2  HGB 8.9* 9.6* 10.1* 9.8* 10.5*  HCT 27.8* 29.3* 30.8* 29.7* 31.9*  MCV  --  85.4 85.1 85.1 85.3  PLT  --  229 224 222 233   Cardiac Enzymes: No results for input(s): "CKTOTAL", "CKMB", "CKMBINDEX", "TROPONINI" in the last 168 hours. BNP: Invalid input(s): "POCBNP" CBG: Recent Labs  Lab 05/16/22  2142 05/17/22 0647 05/17/22 1723 05/17/22 2111 05/18/22 0634  GLUCAP 262* 112* 278* 172* 123*   D-Dimer No results for input(s): "DDIMER" in the last 72 hours. Hgb A1c No results for input(s): "HGBA1C" in the last 72 hours. Lipid Profile No results for input(s): "CHOL", "HDL", "LDLCALC", "TRIG", "CHOLHDL", "LDLDIRECT" in the last 72 hours. Thyroid function studies No results for input(s): "TSH", "T4TOTAL", "T3FREE", "THYROIDAB" in the last 72 hours.  Invalid input(s): "FREET3" Anemia work up No results for input(s): "VITAMINB12", "FOLATE", "FERRITIN", "TIBC", "IRON", "RETICCTPCT" in the last 72 hours. Urinalysis    Component Value Date/Time   COLORURINE YELLOW 05/02/2022 1545   APPEARANCEUR HAZY (A)  05/02/2022 1545   LABSPEC 1.009 05/02/2022 1545   PHURINE 8.0 05/02/2022 1545   GLUCOSEU NEGATIVE 05/02/2022 1545   HGBUR SMALL (A) 05/02/2022 1545   BILIRUBINUR NEGATIVE 05/02/2022 1545   KETONESUR NEGATIVE 05/02/2022 1545   PROTEINUR 30 (A) 05/02/2022 1545   UROBILINOGEN 0.2 05/30/2013 1112   NITRITE NEGATIVE 05/02/2022 1545   LEUKOCYTESUR LARGE (A) 05/02/2022 1545   Sepsis Labs Recent Labs  Lab 05/12/22 0605 05/13/22 0812 05/14/22 0356 05/15/22 0641  WBC 11.0* 10.3 9.6 9.2   Microbiology No results found for this or any previous visit (from the past 240 hour(s)).    SIGNED:   Charlynne Cousins, MD  [facility] 05/18/2022, 10:00 AM Pager   If 7PM-7AM, please contact night-coverage [email or web address] Password [email or web address]

## 2022-05-18 NOTE — Progress Notes (Signed)
Physical Therapy Treatment Patient Details Name: [name] MRN: [id number] DOB: Nov 05, 1965 Today's Date: 05/18/2022   History of Present Illness Pt is a 56 y.o. female admitted 6/26 after being found by her husband on the floor at home with AMS. Admitted with dx of acute metabolic encephalopathy. Incidental COVID+ on admission. Initially admitted to [facility] and transferred to [facility] 7/1 for initiation of HD. Multiple recent hospitalizations, 4/3-5/4 and 6/4-6/14. PMH: PE, Wegener's disease, CKD 3, DM, HTN, chronic pain with an intrathecal pump, OSA, bilat mid-foot amputations, peripheral neuropathy    PT Comments    Patient progressing with activity tolerance and mobility very slowly with continued medical complications reporting new R LE DVT and continued R hip hematoma.  She was able to tolerate EOB and some sit to stands at the bedside.  Feel she should continue to benefit from skilled PT with noted plans for transition to SNF this pm.  Will follow up if not d/c.   Recommendations for follow up therapy are one component of a multi-disciplinary discharge planning process, led by the attending physician.  Recommendations may be updated based on patient status, additional functional criteria and insurance authorization.  Follow Up Recommendations  Skilled nursing-short term rehab (<3 hours/day) Can patient physically be transported by private vehicle: No   Assistance Recommended at Discharge Frequent or constant Supervision/Assistance  Patient can return home with the following Two people to help with walking and/or transfers;Assistance with cooking/housework;Assist for transportation;Two people to help with bathing/dressing/bathroom;Help with stairs or ramp for entrance   Equipment Recommendations  Other (comment) (TBA)    Recommendations for Other Services       Precautions / Restrictions Precautions Precautions: Fall Precaution Comments: h/o bilateral transmet  amputations Restrictions Weight Bearing Restrictions: No     Mobility  Bed Mobility Overal bed mobility: Needs Assistance Bed Mobility: Supine to Sit   Sidelying to sit: Min assist Supine to sit: Min assist Sit to supine: Min assist, HOB elevated   General bed mobility comments: pulls up with HHA for trunk elevation; assist for R LE onto bed to supine    Transfers Overall transfer level: Needs assistance Equipment used: 2 person hand held assist Transfers: Sit to/from Stand Sit to Stand: Mod assist, +2 physical assistance           General transfer comment: pulling up on therapist and tech's arms to stand briefly x 2 from EOB; R knee/hip pain limits    Ambulation/Gait                   Stairs             Wheelchair Mobility    Modified Rankin (Stroke Patients Only)       Balance Overall balance assessment: Needs assistance Sitting-balance support: Feet supported, Feet unsupported Sitting balance-Leahy Scale: Good Sitting balance - Comments: on EOB for trunk and LE therex   Standing balance support: Bilateral upper extremity supported Standing balance-Leahy Scale: Poor Standing balance comment: mod A of 2 for standing briefly                            Cognition Arousal/Alertness: Awake/alert Behavior During Therapy: Flat affect Overall Cognitive Status: Within Functional Limits for tasks assessed  Exercises General Exercises - Upper Extremity Elbow Flexion: AROM, AAROM, Both, 10 reps, Seated General Exercises - Lower Extremity Ankle Circles/Pumps: AROM, 10 reps, Seated, Both Long Arc Quad: Strengthening, Both, 10 reps, Seated Other Exercises Other Exercises: trunk flex/ext with arms crossed over chest x 10 Other Exercises: seated rows bilat UE's with scap squeeze x 10    General Comments General comments (skin integrity, edema, etc.): VSS; applied Voltaren gel to  her R hip and lower back in sitting with some soft tissue work for pain relief; RN aware      Pertinent Vitals/Pain Pain Assessment Faces Pain Scale: Hurts even more Pain Location: lower back, R hip and knee Pain Descriptors / Indicators: Aching, Guarding, Grimacing Pain Intervention(s): Monitored during session, Premedicated before session, Other (comment) (Applied Voltaren)    Home Living                          Prior Function            PT Goals (current goals can now be found in the care plan section) Progress towards PT goals: Progressing toward goals    Frequency    Min 3X/week      PT Plan Current plan remains appropriate    Co-evaluation              AM-PAC PT "6 Clicks" Mobility   Outcome Measure  Help needed turning from your back to your side while in a flat bed without using bedrails?: A Little Help needed moving from lying on your back to sitting on the side of a flat bed without using bedrails?: A Little Help needed moving to and from a bed to a chair (including a wheelchair)?: Total Help needed standing up from a chair using your arms (e.g., wheelchair or bedside chair)?: A Lot Help needed to walk in hospital room?: Total Help needed climbing 3-5 steps with a railing? : Total 6 Click Score: 11    End of Session Equipment Utilized During Treatment: Gait belt Activity Tolerance: Patient tolerated treatment well;Patient limited by pain Patient left: in bed;with call bell/phone within reach   PT Visit Diagnosis: Muscle weakness (generalized) (M62.81);Other abnormalities of gait and mobility (R26.89);Pain Pain - Right/Left: Right Pain - part of body: Hip     Time: 1135-1200 PT Time Calculation (min) (ACUTE ONLY): 25 min  Charges:  $Therapeutic Exercise: 8-22 mins $Therapeutic Activity: 8-22 mins                     Sheran Lawless, PT Acute Rehabilitation Services [phone] 05/18/2022    Elray Mcgregor 05/18/2022, 12:19  PM

## 2022-05-18 NOTE — TOC Transition Note (Addendum)
Transition of Care [facility]) - CM/SW Discharge Note   Patient Details  Name: [name] MRN: [id number] Date of Birth: 08-May-1966  Transition of [facility] CM/SW Contact:  Baldemar Lenis, LCSW Phone Number: 05/18/2022, 10:55 AM   Clinical Narrative:   CSW confirmed with [facility] that authorization was received and bed is available today. MD completed discharge information and CSW sent to [facility]. CSW spoke with spouse, Hessie Diener about transition to [facility] today and he is in agreement. Transport scheduled with [facility] for 3:00 pm.  Nurse to call report to [phone], Room 105.    Final next level of care: Skilled Nursing Facility Barriers to Discharge: Barriers Resolved   Patient Goals and CMS Choice Patient states their goals for this hospitalization and ongoing recovery are:: Pt unavailable CMS Medicare.gov Compare Post Acute Care list provided to:: Patient Represenative (must comment) Choice offered to / list presented to : Patient, Spouse  Discharge Placement              Patient chooses bed at: [facility] Patient to be transferred to facility by: [facility] Name of family member notified: Hessie Diener Patient and family notified of of transfer: 05/18/22  Discharge Plan and Services In-house Referral: Clinical Social Work Discharge Planning Services: CM Consult Post Acute Care Choice: Skilled Nursing Facility                               Social Determinants of Health (SDOH) Interventions     Readmission Risk Interventions    04/18/2022    4:11 PM 04/05/2022    2:33 PM 03/27/2022   10:12 AM  Readmission Risk Prevention Plan  Transportation Screening Complete Complete Complete  Medication Review Oceanographer) Complete Complete Complete  PCP or Specialist appointment within 3-5 days of discharge Complete Complete   HRI or Home Care Consult Complete Complete Complete  SW Recovery Care/Counseling Consult Complete Complete Complete   Palliative Care Screening Not Applicable Not Applicable Not Applicable  Skilled Nursing Facility Not Applicable Not Applicable Not Applicable

## 2022-05-18 NOTE — Progress Notes (Signed)
Report called to [facility], LPN of [facility].  All questions answered.  Informed that [facility] was scheduled to pick this patient up at 3PM.

## 2022-05-31 NOTE — Progress Notes (Deleted)
Synopsis: Referred for GPA, recent pneumonia by Alvira Monday, [name]  Subjective:   PATIENT ID: [name] GENDER: female DOB: [birth year], MRN: [id number]  No chief complaint on file.  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 Valentino Nose with rheumatology and Dr. Michela Pitcher with pulmonary, bronchoscopy/BAL with [facility] 12/2021, recent admissions for myositis (02/19/22-02/23/22) and (03/29/22-04/05/22) for recurrent pneumonia, first occurrence of acute PE. She was seen by pulmonary consult team and underwent bronch/BAL during admission, ID also followed. Some concern for aspiration, she had speech evaluation 6/7 without overt aspiration but she reported history of globus sensation, regurgitation with meat and prior esophageal stricture. Poor dentition noted. She says she last saw ENT   BAL diff was neutrophilic, PJP negative. Yeast seen on diff quik from BAL, candida glabrata on culture from BAL and from TBLB RUL. AFB pending. Serum fungitell neg. 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  When she left the hospital she was not on oxygen, even with exertion. Still gets dyspneic with minimal exertion. Still coughing up greenish material, no hemoptysis. She is down to 5 mg daily of prednisone. She doesn't think she necessarily feels worse as the steroid dose has been decreased. Has no rash right now. No bleeding issues since she's started eliquis.   Regarding her diagnosis of GPA she says she had ENT do FNL and they did biopsy of nasal mucosa, had bloodwork supportive of it as well. She has primarily had upper airway involvement, sores on her legs thought to be involved. Has not clearly had renal involvement to her  knowledge, has never had kidney biopsy.   Her sister has MS  She never smoked, no MJ, vaping.   Interval HPI:  Unfortunately while she had appt at [facility] with pulmonary and nephrology she developed ABLA due to gluteal hematoma, needed IVC filter placement, tentative plan for [facility] rechallenge 06/01/22. She had 7d course of augmentin for possible RUL pneumonia. AKI during hospitalization thought more likely due to UTI/hypotension rather than GN, biopsy deferred.  HRCT Chest showed, PFTs   Otherwise pertinent review of systems is negative.  Past Medical History:  Diagnosis Date   Chronic cough    Chronic pain    Diabetes mellitus without complication (HCC)    Dyspnea    GERD (gastroesophageal reflux disease)    Hypokalemia    Hypothyroidism    Left wrist pain 06/01/2021   Myonecrosis ([place]) 07/01/2021   Neurocardiogenic syncope    OSA (obstructive sleep apnea)    does not use CPAP   Osteomyelitis (HCC)    bilateral feet   Peripheral neuropathy    Presence of intrathecal pump    recieves Prialt/bupivicaine   Tear of gluteus medius tendon 07/01/2021   Wegener's granulomatosis 2009   Wegner's disease (congenital syphilitic osteochondritis)      Family History  Problem Relation Age of Onset   Diabetes Mother    Heart disease Mother    Diabetes Father      Past Surgical History:  Procedure Laterality Date   AMPUTATION Right 10/21/2020   Procedure: Right hallux amputation;  Surgeon: Wylene Simmer, MD;  Location: [place];  Service: Orthopedics;  Laterality: Right;   AMPUTATION TOE Bilateral 08/12/2020  Procedure: Right 3rd toe amputation; left hallux and 3rd toe amputation;  Surgeon: Wylene Simmer, MD;  Location: [place];  Service: Orthopedics;  Laterality: Bilateral;  82mn   AMPUTATION TOE Right 03/24/2021   Procedure: AMPUTATION TOE;  Surgeon: HWylene Simmer MD;  [place]  Service: Orthopedics;  Laterality: Right;   APPENDECTOMY     BACK  SURGERY     BIOPSY  03/30/2022   Procedure: BIOPSY;  Surgeon: OLaurin Coder MD;  [place]  Service: Pulmonary;;   BRONCHIAL WASHINGS  03/30/2022   Procedure: BRONCHIAL WASHINGS;  Surgeon: OLaurin Coder MD;  [place]  Service: Pulmonary;;   CHOLECYSTECTOMY     COLONOSCOPY N/A 06/02/2013   Procedure: COLONOSCOPY;  Surgeon: SWonda Horner MD;  [facility]  Service: Endoscopy;  Laterality: N/A;   HERNIA REPAIR  2000   INCISION AND DRAINAGE ABSCESS Left 07/07/2016   Procedure: DEBRIDMENT LEFT THIGH ABSCESS, EXISION ACUTE SKIN RASH LEFT THIGH(1CM LESION);  Surgeon: HFanny Skates MD;  [facility];  Service: General;  Laterality: Left;   IR FLUORO GUIDE CV LINE RIGHT  04/24/2022   IR IVC FILTER PLMT / S&I /IMG GUID/MOD SED  05/17/2022   IR [place] VASC ACCESS RIGHT  04/24/2022   SEPTOPLASTY  02/2013   spleen anuyism     splenic aneurysm     TRANSMETATARSAL AMPUTATION Bilateral 10/21/2020   Procedure: Left transmetatarsal amputation;  Surgeon: HWylene Simmer MD;  [place]  Service: Orthopedics;  Laterality: Bilateral;   TRANSMETATARSAL AMPUTATION Left 03/24/2021   Procedure: TRANSMETATARSAL AMPUTATION;  Surgeon: HWylene Simmer MD;  [place]  Service: Orthopedics;  Laterality: Left;   VAGINAL HYSTERECTOMY     VIDEO BRONCHOSCOPY Right 03/30/2022   Procedure: VIDEO BRONCHOSCOPY WITH FLUORO;  Surgeon: OLaurin Coder MD;  [facility];  Service: Pulmonary;  Laterality: Right;  atypical pneumonia    Social History   Socioeconomic History   Marital status: Married    Spouse name: Not on file   Number of children: Not on file   Years of education: Not on file   Highest education level: Not on file  Occupational History   Not on file  Tobacco Use   Smoking status: Never   Smokeless tobacco: Never  Vaping Use   Vaping Use: Never used  Substance and Sexual Activity   Alcohol use: Yes    Comment:  occasionally   Drug use: No   Sexual activity: Yes    Birth control/protection: Surgical  Other Topics Concern   Not on file  Social History Narrative   Not on file   Social Determinants of Health   Financial Resource Strain: Not on file  Food Insecurity: Not on file  Transportation Needs: Not on file  Physical Activity: Not on file  Stress: Not on file  Social Connections: Not on file  Intimate Partner Violence: Not on file     Allergies  Allergen Reactions   Ibuprofen Other (See Comments)    Pt is unable to take this due to kidney problems.      Penicillins Rash and Other (See Comments)    Has patient had a PCN reaction causing immediate rash, facial/tongue/throat swelling, SOB or lightheadedness with hypotension: No Has patient had a PCN reaction causing severe rash involving mucus membranes or skin necrosis: No Has patient had a PCN reaction that required hospitalization No Has patient had a PCN reaction occurring within the last  10 years: No If all of the above answers are "NO", then may proceed with Cephalosporin use.     Outpatient Medications Prior to Visit  Medication Sig Dispense Refill   acetaminophen (TYLENOL) 325 MG tablet Take 2 tablets (650 mg total) by mouth every 6 (six) hours as needed for mild pain (or Fever >/= 101).     albuterol (VENTOLIN HFA) 108 (90 Base) MCG/ACT inhaler Inhale 2 puffs into the lungs every 6 (six) hours as needed for wheezing or shortness of breath. 1 each 11   amitriptyline (ELAVIL) 50 MG tablet Take 50 mg by mouth at bedtime.     amLODipine (NORVASC) 10 MG tablet Take 1 tablet (10 mg total) by mouth daily.     [START ON 06/01/2022] apixaban (ELIQUIS) 5 MG TABS tablet Take 1 tablet (5 mg total) by mouth 2 (two) times daily.     Calcium Carb-Cholecalciferol (CALCIUM CARBONATE-VITAMIN D3 PO) Take 1 tablet by mouth daily.     DULoxetine (CYMBALTA) 60 MG capsule Take 60 mg by mouth 2 (two) times daily.     folic acid (FOLVITE) 1 MG tablet  Take 2 mg by mouth daily.     gabapentin (NEURONTIN) 300 MG capsule Take 2 capsules (600 mg total) by mouth 3 (three) times daily. (Patient taking differently: Take 600 mg by mouth 2 (two) times daily.)     guaiFENesin (MUCINEX) 600 MG 12 hr tablet Take 1 tablet (600 mg total) by mouth 2 (two) times daily. 60 tablet 3   insulin aspart (NOVOLOG) 100 UNIT/ML injection Inject 6 Units into the skin 3 (three) times daily with meals. 10 mL 11   insulin glargine-yfgn (SEMGLEE) 100 UNIT/ML injection Inject 0.2 mLs (20 Units total) into the skin at bedtime. 10 mL 11   lidocaine (LIDODERM) 5 % Place 1 patch onto the skin daily as needed (for pain). Remove & Discard patch within 12 hours or as directed by MD     linaclotide (LINZESS) 145 MCG CAPS capsule Take 145 mcg by mouth daily before breakfast.     magnesium oxide (MAG-OX) 400 MG tablet Take 400 mg by mouth daily.     Melatonin 10 MG TABS Take 10 mg by mouth at bedtime as needed (sleep).     metFORMIN (GLUCOPHAGE-XR) 500 MG 24 hr tablet Take 500 mg by mouth daily with breakfast.     methocarbamol (ROBAXIN) 500 MG tablet Take 500 mg by mouth every 6 (six) hours as needed for muscle spasms.     METHOTREXATE SODIUM IJ Inject 25 mg into the skin every Sunday.     naloxone (NARCAN) nasal spray 4 mg/0.1 mL Place 0.4 mg into the nose once.     pantoprazole (PROTONIX) 40 MG tablet Take 40 mg by mouth daily.     Potassium Chloride Crys ER (K-DUR PO) Take 40 mEq by mouth every other day.     predniSONE (DELTASONE) 10 MG tablet Take 1 tab (64m) daily for 2 days, then reduce to your home dose of 566mdaily (Patient taking differently: Take 5 mg by mouth daily.)     rosuvastatin (CRESTOR) 10 MG tablet Take 10 mg by mouth at bedtime.     senna (SENOKOT) 8.6 MG TABS tablet Take 2 tablets (17.2 mg total) by mouth 2 (two) times daily. 30 tablet 0   sulfamethoxazole-trimethoprim (BACTRIM) 400-80 MG tablet Take 1 tablet by mouth 3 (three) times a week. Monday,Wednesday and  Friday     topiramate (TOPAMAX) 100 MG tablet Take  100 mg by mouth daily.     Vitamin D, Ergocalciferol, (DRISDOL) 50000 units CAPS capsule Take 50,000 Units by mouth every Friday.     No facility-administered medications prior to visit.       Objective:   Physical Exam:  General appearance: 56 y.o., female, NAD, conversant  Eyes: anicteric sclerae; PERRL, tracking appropriately HENT: NCAT; MMM, breathy voice Neck: Trachea midline; no lymphadenopathy, no JVD Lungs: diminished with faint rhonchi, no wheeze, with normal respiratory effort CV: RRR, no murmur  Abdomen: Soft, non-tender; non-distended, BS present  Extremities: No peripheral edema, warm Skin: Normal turgor and texture; no rash Psych: Appropriate affect Neuro: Alert and oriented to person and place, no focal deficit     There were no vitals filed for this visit.    on RA BMI Readings from Last 3 Encounters:  05/09/22 35.26 kg/m  04/12/22 32.22 kg/m  04/07/22 33.47 kg/m   Wt Readings from Last 3 Encounters:  05/09/22 211 lb 13.8 oz (96.1 kg)  04/12/22 199 lb 9.6 oz (90.5 kg)  04/07/22 195 lb (88.5 kg)     CBC    Component Value Date/Time   WBC 9.2 05/15/2022 0641   RBC 3.74 (L) 05/15/2022 0641   HGB 10.5 (L) 05/15/2022 0641   HCT 31.9 (L) 05/15/2022 0641   PLT 233 05/15/2022 0641   MCV 85.3 05/15/2022 0641   MCH 28.1 05/15/2022 0641   MCHC 32.9 05/15/2022 0641   RDW 17.3 (H) 05/15/2022 0641   LYMPHSABS 2.4 05/03/2022 0450   MONOABS 1.2 (H) 05/03/2022 0450   EOSABS 0.0 05/03/2022 0450   BASOSABS 0.1 05/03/2022 0450    Chest Imaging: CTA Chest 03/27/22 reviewed by me with multifocal, upper lobe predominant ggo, small RLL/RML PE, chronic R hemidiaphragm elevation (dating back to 2014 at least)  CT Chest 12/2021 failed to download  HRCT Chest 12/20/21 reviewed by me with marked bronchial wall thickening, airway secretions, scattered TIB inflammation and subsegmental atelectasis/scar  Pulmonary  Functions Testing Results:     No data to display         OSH PFT 12/22/21 reviewed by me: The FEV1 and FVC are mildly reduced and suggest a restrictive lung defect. The TLC is mildly reduced. There is a moderate reduction of the diffusing capacity.   Echocardiogram:   TTE 03/28/22  1. Left ventricular ejection fraction, by estimation, is 55%. The left  ventricle has normal function. The left ventricle has no regional wall  motion abnormalities. Left ventricular diastolic parameters were normal.   2. Right ventricular systolic function is normal. The right ventricular  size is normal. There is normal pulmonary artery systolic pressure.   3. The pericardial effusion is anterior to the right ventricle.   4. The mitral valve is abnormal. Trivial mitral valve regurgitation. No  evidence of mitral stenosis.   5. The aortic valve is tricuspid. There is mild calcification of the  aortic valve. Aortic valve regurgitation is not visualized. Aortic valve  sclerosis is present, with no evidence of aortic valve stenosis.   6. The inferior vena cava is normal in size with greater than 50%  respiratory variability, suggesting right atrial pressure of 3 mmHg.      Assessment & Plan:   # Pneumonitis Unclear etiology. Improved after course of ABX, increased steroid dose. Candida recovered from cultures of BAL and TBLB but not BCx and I've never seen candida PNA in a patient that wasn't fungemic - she also got better without antifungal. I'd lean  toward CAP from either typical or atypical organism including viral LRTI, recurrence however raises possibility of aspiration (does endorse esophageal phase dysphagia with h/o stenosis) or untreated NTM infx though radiographic appearance now not classic for it. Relatively quick recovery on minimally increased steroid dose seems less consistent with organizing pneumonia which could have either been from flare of GPA (seems doubtful after having just received  rituximab), or rituximab (clinical deterioration after rituximab the last 2 times it was dosed however she's been on this a long time). Wouldn't expect pneumonitis to suddenly show up with this radiographic appearance this late in the course of treatment with methotrexate.   # GPA  # Hoarseness, breathy voice History of upper airway involvement primarily, per pt known vocal cord involvement. This was not documented during DL by anesthesia before recent bronchoscopy.   # Acute PE:  Small PE burden. ?unprovoked with persistent risk factor (CTD).   # Solid food dysphagia # History of esophageal stenosis  Plan: - f/u AFB culture, has ID appointment tomorrow - HRCT Chest at the end of July - PFTs (breathing tests) next visit in 6 weeks - A bunch of referrals: [facility] - she wishes to establish care for second opinion, ENT for hoarseness/breathy voice and prior GPA involvement, GI for dysphagia and prior stricture - stick with prednisone 5 mg daily, current methotrexate for now - start mucinex 600 BID, albuterol 2 puffs twice daily followed by flutter valve 10 slow but firm puffs twice daily - eliquis for at least 3 months, although PE size small, consideration of longer term AC given persistent risk factor    RTC 6 weeks with Dr. Vaughan Browner or Dr. Chase Caller along with PFTs, review HRCT Chest  Maryjane Hurter, MD [facility] 05/31/2022 1:04 PM

## 2022-06-01 ENCOUNTER — Ambulatory Visit: Payer: BC Managed Care – PPO | Admitting: Student

## 2022-06-14 ENCOUNTER — Ambulatory Visit: Payer: BC Managed Care – PPO | Admitting: Gastroenterology

## 2022-06-14 ENCOUNTER — Inpatient Hospital Stay (HOSPITAL_COMMUNITY): Admission: RE | Admit: 2022-06-14 | Payer: BC Managed Care – PPO | Source: Ambulatory Visit

## 2022-06-27 ENCOUNTER — Telehealth: Payer: Self-pay

## 2022-06-27 ENCOUNTER — Ambulatory Visit: Payer: BC Managed Care – PPO | Admitting: Family Medicine

## 2022-06-27 ENCOUNTER — Encounter: Payer: Self-pay | Admitting: Family Medicine

## 2022-06-27 VITALS — BP 111/78 | HR 103 | Ht 65.0 in | Wt 188.0 lb

## 2022-06-27 DIAGNOSIS — M313 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: Secondary | ICD-10-CM | POA: Diagnosis not present

## 2022-06-27 DIAGNOSIS — M792 Neuralgia and neuritis, unspecified: Secondary | ICD-10-CM | POA: Diagnosis not present

## 2022-06-27 DIAGNOSIS — E639 Nutritional deficiency, unspecified: Secondary | ICD-10-CM

## 2022-06-27 DIAGNOSIS — Z713 Dietary counseling and surveillance: Secondary | ICD-10-CM

## 2022-06-27 DIAGNOSIS — K661 Hemoperitoneum: Secondary | ICD-10-CM

## 2022-06-27 DIAGNOSIS — I2699 Other pulmonary embolism without acute cor pulmonale: Secondary | ICD-10-CM

## 2022-06-27 DIAGNOSIS — R21 Rash and other nonspecific skin eruption: Secondary | ICD-10-CM | POA: Diagnosis not present

## 2022-06-27 DIAGNOSIS — I10 Essential (primary) hypertension: Secondary | ICD-10-CM

## 2022-06-27 DIAGNOSIS — I700 Atherosclerosis of aorta: Secondary | ICD-10-CM

## 2022-06-27 DIAGNOSIS — G63 Polyneuropathy in diseases classified elsewhere: Secondary | ICD-10-CM

## 2022-06-27 DIAGNOSIS — Z978 Presence of other specified devices: Secondary | ICD-10-CM

## 2022-06-27 DIAGNOSIS — E1100 Type 2 diabetes mellitus with hyperosmolarity without nonketotic hyperglycemic-hyperosmolar coma (NKHHC): Secondary | ICD-10-CM

## 2022-06-27 DIAGNOSIS — I82402 Acute embolism and thrombosis of unspecified deep veins of left lower extremity: Secondary | ICD-10-CM

## 2022-06-27 DIAGNOSIS — J181 Lobar pneumonia, unspecified organism: Secondary | ICD-10-CM

## 2022-06-27 DIAGNOSIS — J157 Pneumonia due to Mycoplasma pneumoniae: Secondary | ICD-10-CM

## 2022-06-27 DIAGNOSIS — G629 Polyneuropathy, unspecified: Secondary | ICD-10-CM

## 2022-06-27 DIAGNOSIS — G6189 Other inflammatory polyneuropathies: Secondary | ICD-10-CM

## 2022-06-27 MED ORDER — TRIAMCINOLONE ACETONIDE 0.1 % EX CREA: 1.0000 | TOPICAL_CREAM | Freq: Two times a day (BID) | CUTANEOUS | 0 refills | Status: DC

## 2022-06-27 NOTE — Progress Notes (Signed)
Established Patient Office Visit  Subjective:  Patient ID: Khadeja Abt, female    DOB: 1966/08/07  Age: 56 y.o. MRN: [id number]  CC:  Chief Complaint  Patient presents with   Follow-up    3 month f/u, pt states their has been lots of changes since last ov.     HPI Keirah Konitzer is a 56 y.o. female with past medical history of Wegener's angiitis, chronic opioid dependency, hyperlipidemia, hypertension, impaired glucose tolerance, adrenal insufficiency on chronic prednisone usage, rheumatoid arthritis and multiple admissions secondary to sepsis in the setting of pneumonia and UTI, PE on Eliquis presents for f/u of  chronic medical conditions. Granulomatosis with polyangiitis: currently on Rituxan and methotrexate. The patient  used to follow up with her rheumatologist with [facility] but reported during her last visit that she wanted to stay within the [place] system and a referral was placed.  At today's appointment, the patient is accompanied by her husband, who states that after much research, he believes that the best rheumatologists are in [place]. They would like to see a rheumatologist in the [facility] health system.   Presence of intrathecal pump (Chronic): chronic pain. She shares that her intrathecal pump infusing bupivacaine/ziconitide has not been filled since she was discharged from the hospital on 05/18/22, and she has not followed up with the pain clinic at [facility].  Secondary adrenal insufficiency: on chronic prednisone use. She takes prednisone 5 mg daily. Diabetes mellitus type 2: T2DM secondary to chronic prednisone use. Last A1c of 8.6 on 03/26/22. She is currently taking metformin 500 mg daily. No reports of the 3ps of diabetes were noted today. Retroperitoneal hematoma: The patient was noted to have gluteal hematoma when admitted to the hospital on 04/17/22. She was started on IV heparin on admission; heparin was stopped as her hemoglobin dropped, and she was transfused several  units of packed red blood cells. A CT scan of the abdomen and pelvis confirmed the extension of her right gluteal hematoma. PT evaluated her with a recommendation for short-term rehab.  History of PE in June 2023/newly diagnosed DVT: Newly diagnosed DVT  during her hospital stay on 04/17/22-05/18/22 during her hospital stay on 04/17/22. Lower extremity Doppler was done, which showed a new DVT. An IVC filter was placed, and the patient was kept off anticoagulation until 06/01/2022. Right upper lobe pneumonia: was started on a 7-day course of Augmentin during her hospital stay from 04/17/22-05/18/22 for pneumonia. She has completed treatment with no reports of cough and SOB today.  Chemotherapy-induced peripheral neuropathy: The patient is on gabapentin, Cymbalta, amitriptyline, and topiramate.     Past Medical History:  Diagnosis Date   Chronic cough    Chronic pain    Diabetes mellitus without complication (HCC)    Dyspnea    GERD (gastroesophageal reflux disease)    Hypokalemia    Hypothyroidism    Left wrist pain 06/01/2021   Myonecrosis ([employer]) 07/01/2021   Neurocardiogenic syncope    OSA (obstructive sleep apnea)    does not use CPAP   Osteomyelitis (HCC)    bilateral feet   Peripheral neuropathy    Presence of intrathecal pump    recieves Prialt/bupivicaine   Tear of gluteus medius tendon 07/01/2021   Wegener's granulomatosis 2009   Wegner's disease (congenital syphilitic osteochondritis)     Past Surgical History:  Procedure Laterality Date   AMPUTATION Right 10/21/2020   Procedure: Right hallux amputation;  Surgeon: Wylene Simmer, MD;  Location: [place];  Service: Orthopedics;  Laterality: Right;   AMPUTATION TOE Bilateral 08/12/2020   Procedure: Right 3rd toe amputation; left hallux and 3rd toe amputation;  Surgeon: Wylene Simmer, MD;  Location: [place];  Service: Orthopedics;  Laterality: Bilateral;  58mn   AMPUTATION TOE Right 03/24/2021   Procedure:  AMPUTATION TOE;  Surgeon: HWylene Simmer MD;  [place]  Service: Orthopedics;  Laterality: Right;   APPENDECTOMY     BACK SURGERY     BIOPSY  03/30/2022   Procedure: BIOPSY;  Surgeon: OLaurin Coder MD;  [place]  Service: Pulmonary;;   BRONCHIAL WASHINGS  03/30/2022   Procedure: BRONCHIAL WASHINGS;  Surgeon: OLaurin Coder MD;  [place]  Service: Pulmonary;;   CHOLECYSTECTOMY     COLONOSCOPY N/A 06/02/2013   Procedure: COLONOSCOPY;  Surgeon: SWonda Horner MD;  [facility]  Service: Endoscopy;  Laterality: N/A;   HERNIA REPAIR  2000   INCISION AND DRAINAGE ABSCESS Left 07/07/2016   Procedure: DEBRIDMENT LEFT THIGH ABSCESS, EXISION ACUTE SKIN RASH LEFT THIGH(1CM LESION);  Surgeon: HFanny Skates MD;  [facility];  Service: General;  Laterality: Left;   IR FLUORO GUIDE CV LINE RIGHT  04/24/2022   IR IVC FILTER PLMT / S&I /IMG GUID/MOD SED  05/17/2022   IR [place] VASC ACCESS RIGHT  04/24/2022   SEPTOPLASTY  02/2013   spleen anuyism     splenic aneurysm     TRANSMETATARSAL AMPUTATION Bilateral 10/21/2020   Procedure: Left transmetatarsal amputation;  Surgeon: HWylene Simmer MD;  [place]  Service: Orthopedics;  Laterality: Bilateral;   TRANSMETATARSAL AMPUTATION Left 03/24/2021   Procedure: TRANSMETATARSAL AMPUTATION;  Surgeon: HWylene Simmer MD;  [place]  Service: Orthopedics;  Laterality: Left;   VAGINAL HYSTERECTOMY     VIDEO BRONCHOSCOPY Right 03/30/2022   Procedure: VIDEO BRONCHOSCOPY WITH FLUORO;  Surgeon: OLaurin Coder MD;  [facility];  Service: Pulmonary;  Laterality: Right;  atypical pneumonia    Family History  Problem Relation Age of Onset   Diabetes Mother    Heart disease Mother    Diabetes Father     Social History   Socioeconomic History   Marital status: Married    Spouse name: Not on file   Number of children: Not on file   Years of education: Not on file    Highest education level: Not on file  Occupational History   Not on file  Tobacco Use   Smoking status: Never   Smokeless tobacco: Never  Vaping Use   Vaping Use: Never used  Substance and Sexual Activity   Alcohol use: Yes    Comment: occasionally   Drug use: No   Sexual activity: Yes    Birth control/protection: Surgical  Other Topics Concern   Not on file  Social History Narrative   Not on file   Social Determinants of Health   Financial Resource Strain: Not on file  Food Insecurity: Not on file  Transportation Needs: Not on file  Physical Activity: Not on file  Stress: Not on file  Social Connections: Not on file  Intimate Partner Violence: Not on file    Outpatient Medications Prior to Visit  Medication Sig Dispense Refill   acetaminophen (TYLENOL) 325 MG tablet Take 2 tablets (650 mg total) by mouth every 6 (six) hours as needed for mild pain (or Fever >/= 101).     albuterol (VENTOLIN HFA) 108 (90 Base) MCG/ACT inhaler Inhale  2 puffs into the lungs every 6 (six) hours as needed for wheezing or shortness of breath. 1 each 11   amitriptyline (ELAVIL) 50 MG tablet Take 50 mg by mouth at bedtime.     amLODipine (NORVASC) 10 MG tablet Take 1 tablet (10 mg total) by mouth daily.     apixaban (ELIQUIS) 5 MG TABS tablet Take 1 tablet (5 mg total) by mouth 2 (two) times daily.     Calcium Carb-Cholecalciferol (CALCIUM CARBONATE-VITAMIN D3 PO) Take 1 tablet by mouth daily.     DULoxetine (CYMBALTA) 60 MG capsule Take 60 mg by mouth 2 (two) times daily.     folic acid (FOLVITE) 1 MG tablet Take 2 mg by mouth daily.     gabapentin (NEURONTIN) 300 MG capsule Take 2 capsules (600 mg total) by mouth 3 (three) times daily.     guaiFENesin (MUCINEX) 600 MG 12 hr tablet Take 1 tablet (600 mg total) by mouth 2 (two) times daily. 60 tablet 3   HYDROcodone-acetaminophen (NORCO/VICODIN) 5-325 MG tablet Take 1 tablet by mouth 2 (two) times daily as needed.     lidocaine (LIDODERM) 5 %  Place 1 patch onto the skin daily as needed (for pain). Remove & Discard patch within 12 hours or as directed by MD     linaclotide (LINZESS) 145 MCG CAPS capsule Take 145 mcg by mouth daily before breakfast.     magnesium oxide (MAG-OX) 400 MG tablet Take 400 mg by mouth daily.     Melatonin 10 MG TABS Take 10 mg by mouth at bedtime as needed (sleep).     metFORMIN (GLUCOPHAGE-XR) 500 MG 24 hr tablet Take 500 mg by mouth daily with breakfast.     methocarbamol (ROBAXIN) 500 MG tablet Take 500 mg by mouth every 6 (six) hours as needed for muscle spasms.     METHOTREXATE SODIUM IJ Inject 25 mg into the skin every Sunday.     naloxone (NARCAN) nasal spray 4 mg/0.1 mL Place 0.4 mg into the nose once.     pantoprazole (PROTONIX) 40 MG tablet Take 40 mg by mouth daily.     Potassium Chloride Crys ER (K-DUR PO) Take 40 mEq by mouth every other day.     predniSONE (DELTASONE) 10 MG tablet Take 1 tab (74m) daily for 2 days, then reduce to your home dose of 534mdaily     rosuvastatin (CRESTOR) 10 MG tablet Take 10 mg by mouth at bedtime.     senna (SENOKOT) 8.6 MG TABS tablet Take 2 tablets (17.2 mg total) by mouth 2 (two) times daily. 30 tablet 0   sulfamethoxazole-trimethoprim (BACTRIM) 400-80 MG tablet Take 1 tablet by mouth 3 (three) times a week. Monday,Wednesday and Friday     topiramate (TOPAMAX) 100 MG tablet Take 100 mg by mouth daily.     Vitamin D, Ergocalciferol, (DRISDOL) 50000 units CAPS capsule Take 50,000 Units by mouth every Friday.     insulin aspart (NOVOLOG) 100 UNIT/ML injection Inject 6 Units into the skin 3 (three) times daily with meals. 10 mL 11   insulin glargine-yfgn (SEMGLEE) 100 UNIT/ML injection Inject 0.2 mLs (20 Units total) into the skin at bedtime. 10 mL 11   No facility-administered medications prior to visit.    Allergies  Allergen Reactions   Ibuprofen Other (See Comments)    Pt is unable to take this due to kidney problems.      Penicillins Rash and Other (See  Comments)    Has patient  had a PCN reaction causing immediate rash, facial/tongue/throat swelling, SOB or lightheadedness with hypotension: No Has patient had a PCN reaction causing severe rash involving mucus membranes or skin necrosis: No Has patient had a PCN reaction that required hospitalization No Has patient had a PCN reaction occurring within the last 10 years: No If all of the above answers are "NO", then may proceed with Cephalosporin use.    ROS Review of Systems  Constitutional:  Negative for diaphoresis and fever.  Eyes:  Negative for photophobia and discharge.  Respiratory:  Negative for cough, chest tightness, shortness of breath and wheezing.   Cardiovascular:  Negative for chest pain and palpitations.  Endocrine: Negative for polydipsia, polyphagia and polyuria.  Musculoskeletal:  Negative for back pain, neck pain and neck stiffness.  Skin:  Positive for rash.  Neurological:  Negative for dizziness and facial asymmetry.      Objective:    Physical Exam HENT:     Head: Normocephalic.  Cardiovascular:     Rate and Rhythm: Normal rate and regular rhythm.     Pulses: Normal pulses.     Heart sounds: Normal heart sounds.  Pulmonary:     Effort: Pulmonary effort is normal.     Breath sounds: Normal breath sounds.  Abdominal:     Tenderness: There is no right CVA tenderness or left CVA tenderness.  Musculoskeletal:     Comments: No warmth,unilateral swelling or tenderness noted on lower extremities.  No skin discoloration noted at the site of right gluteal hematoma. Mild tenderness noted with palpation with c/o of neuropathic pain at nighttime    Skin:    Findings: Rash (generalized redden papules noted on physical exam.) present.  Neurological:     Mental Status: She is alert and oriented to person, place, and time.     BP 111/78   Pulse (!) 103   Ht 5' 5" (1.651 m)   Wt 188 lb (85.3 kg)   SpO2 97%   BMI 31.28 kg/m  Wt Readings from Last 3 Encounters:   06/27/22 188 lb (85.3 kg)  05/09/22 211 lb 13.8 oz (96.1 kg)  04/12/22 199 lb 9.6 oz (90.5 kg)    Lab Results  Component Value Date   TSH 1.657 04/19/2022   Lab Results  Component Value Date   WBC 9.2 05/15/2022   HGB 10.5 (L) 05/15/2022   HCT 31.9 (L) 05/15/2022   MCV 85.3 05/15/2022   PLT 233 05/15/2022   Lab Results  Component Value Date   NA 137 05/18/2022   K 4.1 05/18/2022   CO2 18 (L) 05/18/2022   GLUCOSE 112 (H) 05/18/2022   BUN 37 (H) 05/18/2022   CREATININE 1.58 (H) 05/18/2022   BILITOT 1.2 05/09/2022   ALKPHOS 88 05/09/2022   AST 16 05/09/2022   ALT 19 05/09/2022   PROT 5.0 (L) 05/09/2022   ALBUMIN 2.8 (L) 05/18/2022   CALCIUM 8.7 (L) 05/18/2022   ANIONGAP 13 05/18/2022   EGFR 61 07/01/2021   No results found for: "CHOL" No results found for: "HDL" No results found for: "LDLCALC" No results found for: "TRIG" No results found for: "CHOLHDL" Lab Results  Component Value Date   HGBA1C 8.6 (H) 03/26/2022      Assessment & Plan:   Problem List Items Addressed This Visit       Cardiovascular and Mediastinum   Granulomatosis with polyangiitis ([place]) - Primary    Referral placed for a rheumatology in the [facility] health system  Relevant Orders   Ambulatory referral to Rheumatology   Essential hypertension    Controlled Pt is adherent on antihypertensive      Pulmonary embolism (HCC)    Complaint with Eliquis No complaints today      Acute deep vein thrombosis (DVT) of left lower extremity ([place])    Newly diagnosed DVT  during her hospital stay on 04/17/22-05/18/22 during her hospital stay on 04/17/22 Lower extremity Doppler was done, which showed a new DVT An IVC filter was placed, and the patient was kept off anticoagulation until 06/01/2022 She has resume her anticoagulant treatment Physical exam negative for DVT       Aortic atherosclerosis (HCC)    Pt is taking Crestor 10 mg daily and is compliant with treatment regimen         Respiratory   Lobar pneumonia ([place])    was started on a 7-day course of Augmentin during her hospital stay from 04/17/22-05/18/22 for pneumonia She has completed treatment with no reports of cough and SOB today Repeat Ct scan of the chest ordered to assess pneumonia clearing        Endocrine   Type 2 diabetes mellitus ([place])    T2DM secondary to chronic prednisone use Lab Results  Component Value Date   HGBA1C 8.6 (H) 03/26/2022   She is currently taking metformin 500 mg daily No reports of the 3ps of diabetes were noted today Referral placed to a nutritionist for nutritious counseling         Nervous and Auditory   Peripheral neuropathy    C/o of peripheral neuropathy in the right leg distal to the right hematoma Encouraged to continue taking gabapentin and Cymbalta Will place a referral to a chiropractor for symptom relief      Relevant Orders   Ambulatory referral to Chiropractic   Polyneuropathy associated with underlying disease [facility])   Relevant Orders   Ambulatory referral to Chiropractic   Neuropathy   Relevant Orders   Ambulatory referral to Chiropractic     Musculoskeletal and Integument   Rash of body    Reports unknown etiology of rash Has followed up with dermatology She noted that symptoms relapse and remit She c/o of rash pruritis  Will treat with kenalog cream       Relevant Medications   triamcinolone cream (KENALOG) 0.1 %     Other   Presence of intrathecal pump (Chronic)    She shares that her intrathecal pump infusing bupivacaine/ziconitide has not been filled since she was discharged from the hospital on 05/18/22 Encouraged to f/u with the pain clinic at [place]      Other Visit Diagnoses     Nerve pain       Rash       Relevant Medications   triamcinolone cream (KENALOG) 0.1 %   Nutritional counseling       Nutritional deficiency       Relevant Orders   Referral to Nutrition and Diabetes Services   Retroperitoneal hematoma       Relevant  Orders   CBC with Differential/Platelet   Basic Metabolic Panel (BMET)   Pneumonia of right upper lobe due to Mycoplasma pneumoniae       Relevant Orders   CT Chest Wo Contrast       Meds ordered this encounter  Medications   triamcinolone cream (KENALOG) 0.1 %    Sig: Apply 1 Application topically 2 (two) times daily.    Dispense:  30 g  Refill:  0    Follow-up: Return in about 3 months (around 09/26/2022).    Alvira Monday, [name]

## 2022-06-27 NOTE — Progress Notes (Signed)
Fo

## 2022-06-27 NOTE — Patient Instructions (Addendum)
I appreciate the opportunity to provide care to you today!    Follow up:  3 months  Labs: next visit  Please pick up your prescription at the pharmacy   Referrals today-  Nutritionist, Chiropractor, rheumatology   Please continue to a heart-healthy diet and increase your physical activities. Try to exercise for at least three times a week.      It was a pleasure to see you and I look forward to continuing to work together on your health and well-being. Please do not hesitate to call the office if you need care or have questions about your care.   Have a wonderful day and week. With Gratitude, [name] MSN, [name]-BC

## 2022-06-27 NOTE — Telephone Encounter (Signed)
Denzil Magnuson called from [facility] need verbal order for frequency home health physical therapy  2 week x 3 1 week 3  Call back # [phone]

## 2022-06-28 ENCOUNTER — Telehealth: Payer: Self-pay

## 2022-06-28 NOTE — Telephone Encounter (Signed)
Verbal order given to Ben. 

## 2022-06-28 NOTE — Telephone Encounter (Signed)
Pam from [facility] called [phone] referral that was sent has a Z  diagnosis code .  Need to have a R or E for diagnosis  code for nutritional counseling can not have no Z diagnosis  code.

## 2022-06-29 DIAGNOSIS — E119 Type 2 diabetes mellitus without complications: Secondary | ICD-10-CM | POA: Insufficient documentation

## 2022-06-29 DIAGNOSIS — I700 Atherosclerosis of aorta: Secondary | ICD-10-CM | POA: Insufficient documentation

## 2022-06-29 NOTE — Telephone Encounter (Signed)
Updated.

## 2022-06-29 NOTE — Assessment & Plan Note (Signed)
Newly diagnosed DVT  during her hospital stay on 04/17/22-05/18/22 during her hospital stay on 04/17/22 Lower extremity Doppler was done, which showed a new DVT An IVC filter was placed, and the patient was kept off anticoagulation until 06/01/2022 She has resume her anticoagulant treatment Physical exam negative for DVT

## 2022-06-29 NOTE — Assessment & Plan Note (Signed)
She shares that her intrathecal pump infusing bupivacaine/ziconitide has not been filled since she was discharged from the hospital on 05/18/22 Encouraged to f/u with the pain clinic at [facility]

## 2022-06-29 NOTE — Assessment & Plan Note (Signed)
was started on a 7-day course of Augmentin during her hospital stay from 04/17/22-05/18/22 for pneumonia She has completed treatment with no reports of cough and SOB today Repeat Ct scan of the chest ordered to assess pneumonia clearing

## 2022-06-29 NOTE — Assessment & Plan Note (Signed)
C/o of peripheral neuropathy in the right leg distal to the right hematoma Encouraged to continue taking gabapentin and Cymbalta Will place a referral to a chiropractor for symptom relief

## 2022-06-29 NOTE — Assessment & Plan Note (Signed)
Pt is taking Crestor 10 mg daily and is compliant with treatment regimen

## 2022-06-29 NOTE — Assessment & Plan Note (Signed)
Referral placed for a rheumatology in the [facility] health system

## 2022-06-29 NOTE — Assessment & Plan Note (Signed)
Complaint with Eliquis No complaints today

## 2022-06-29 NOTE — Assessment & Plan Note (Signed)
T2DM secondary to chronic prednisone use Lab Results  Component Value Date   HGBA1C 8.6 (H) 03/26/2022   She is currently taking metformin 500 mg daily No reports of the 3ps of diabetes were noted today Referral placed to a nutritionist for nutritious counseling

## 2022-06-29 NOTE — Assessment & Plan Note (Addendum)
Reports unknown etiology of rash Has followed up with dermatology She noted that symptoms relapse and remit She c/o of rash pruritis  Will treat with kenalog cream

## 2022-06-29 NOTE — Assessment & Plan Note (Signed)
Controlled Pt is adherent on antihypertensive

## 2022-06-30 ENCOUNTER — Ambulatory Visit (HOSPITAL_COMMUNITY)
Admission: RE | Admit: 2022-06-30 | Discharge: 2022-06-30 | Disposition: A | Discharge status: Home / Self Care | Payer: BC Managed Care – PPO | Source: Ambulatory Visit | Attending: Family Medicine | Admitting: Family Medicine

## 2022-06-30 ENCOUNTER — Other Ambulatory Visit: Payer: Self-pay | Admitting: Family Medicine

## 2022-06-30 DIAGNOSIS — J157 Pneumonia due to Mycoplasma pneumoniae: Secondary | ICD-10-CM

## 2022-06-30 LAB — CBC WITH DIFFERENTIAL/PLATELET
Basophils Absolute: 0.1 10*3/uL (ref 0.0–0.2)
Basos: 1 %
EOS (ABSOLUTE): 0.1 10*3/uL (ref 0.0–0.4)
Eos: 1 %
Hematocrit: 35.1 % (ref 34.0–46.6)
Hemoglobin: 11.2 g/dL (ref 11.1–15.9)
Immature Grans (Abs): 0.5 10*3/uL — ABNORMAL HIGH (ref 0.0–0.1)
Immature Granulocytes: 4 %
Lymphocytes Absolute: 3.8 10*3/uL — ABNORMAL HIGH (ref 0.7–3.1)
Lymphs: 30 %
MCH: 29 pg (ref 26.6–33.0)
MCHC: 31.9 g/dL (ref 31.5–35.7)
MCV: 91 fL (ref 79–97)
Monocytes Absolute: 1.1 10*3/uL — ABNORMAL HIGH (ref 0.1–0.9)
Monocytes: 9 %
Neutrophils Absolute: 7 10*3/uL (ref 1.4–7.0)
Neutrophils: 55 %
Platelets: 314 10*3/uL (ref 150–450)
RBC: 3.86 x10E6/uL (ref 3.77–5.28)
RDW: 16.8 % — ABNORMAL HIGH (ref 11.7–15.4)
WBC: 12.8 10*3/uL — ABNORMAL HIGH (ref 3.4–10.8)

## 2022-06-30 LAB — BASIC METABOLIC PANEL
BUN/Creatinine Ratio: 16 (ref 9–23)
BUN: 17 mg/dL (ref 6–24)
CO2: 19 mmol/L — ABNORMAL LOW (ref 20–29)
Calcium: 9.3 mg/dL (ref 8.7–10.2)
Chloride: 106 mmol/L (ref 96–106)
Creatinine, Ser: 1.07 mg/dL — ABNORMAL HIGH (ref 0.57–1.00)
Glucose: 189 mg/dL — ABNORMAL HIGH (ref 70–99)
Potassium: 4.4 mmol/L (ref 3.5–5.2)
Sodium: 140 mmol/L (ref 134–144)
eGFR: 61 mL/min/{1.73_m2} (ref >59)

## 2022-07-03 ENCOUNTER — Telehealth: Payer: Self-pay | Admitting: Family Medicine

## 2022-07-03 NOTE — Telephone Encounter (Signed)
Results shows resolving infection

## 2022-07-03 NOTE — Telephone Encounter (Signed)
Pt called asking for CXR results?

## 2022-07-03 NOTE — Progress Notes (Signed)
Please inform the patient that her x-ray shows resolving pneumonia

## 2022-07-03 NOTE — Telephone Encounter (Signed)
Pt c/o swelling in feet and legs would like to have something sent to pharmacy.

## 2022-07-03 NOTE — Progress Notes (Signed)
Please inform the patient that her labs are stabilizing compared to when she was in the hospital. Her glucose was elevated, and I recommend low carbs and reducing her intake of high-sugar foods.

## 2022-07-04 ENCOUNTER — Other Ambulatory Visit: Payer: Self-pay | Admitting: Family Medicine

## 2022-07-04 ENCOUNTER — Encounter: Payer: Self-pay | Admitting: Family Medicine

## 2022-07-04 DIAGNOSIS — R609 Edema, unspecified: Secondary | ICD-10-CM

## 2022-07-04 MED ORDER — HYDROCHLOROTHIAZIDE 12.5 MG PO TABS: 12.5000 mg | ORAL_TABLET | Freq: Every day | ORAL | 0 refills | Status: DC

## 2022-07-04 NOTE — Telephone Encounter (Signed)
Pt states she is still doing steroids at this time.

## 2022-07-04 NOTE — Telephone Encounter (Signed)
Please inform the patient that I've sent a short supply of diuretics to help with the swelling. Inform her that her swelling is likely from the Wegener granulomatosis. Is she taking her daily steroids?

## 2022-07-04 NOTE — Telephone Encounter (Signed)
Pt called stating she is wanting to know if she can please get a referral to [facility]?

## 2022-07-04 NOTE — Telephone Encounter (Signed)
Okay, that's fine

## 2022-07-04 NOTE — Telephone Encounter (Signed)
Erroneous encounter

## 2022-07-10 ENCOUNTER — Ambulatory Visit: Payer: BC Managed Care – PPO | Admitting: Family Medicine

## 2022-07-19 ENCOUNTER — Telehealth: Payer: Self-pay | Admitting: *Deleted

## 2022-07-19 NOTE — Telephone Encounter (Signed)
All medications okay to fill?

## 2022-07-19 NOTE — Telephone Encounter (Signed)
Patient is going to run out of medication she needs all medications filled but some were given to her by rehab. Please call patient

## 2022-07-20 ENCOUNTER — Ambulatory Visit (HOSPITAL_COMMUNITY)
Admission: RE | Admit: 2022-07-20 | Discharge: 2022-07-20 | Disposition: A | Discharge status: Home / Self Care | Payer: BC Managed Care – PPO | Source: Ambulatory Visit | Attending: Family Medicine | Admitting: Family Medicine

## 2022-07-20 DIAGNOSIS — Z1231 Encounter for screening mammogram for malignant neoplasm of breast: Secondary | ICD-10-CM | POA: Diagnosis present

## 2022-07-20 NOTE — Telephone Encounter (Signed)
Patient called in requesting call back. Wants to give provider list of meds that needs to be refilled

## 2022-07-21 ENCOUNTER — Other Ambulatory Visit: Payer: Self-pay

## 2022-07-21 ENCOUNTER — Telehealth: Payer: Self-pay | Admitting: Family Medicine

## 2022-07-21 DIAGNOSIS — R609 Edema, unspecified: Secondary | ICD-10-CM

## 2022-07-21 MED ORDER — LINACLOTIDE 145 MCG PO CAPS: 145.0000 ug | ORAL_CAPSULE | Freq: Every day | ORAL | 1 refills | Status: DC

## 2022-07-21 MED ORDER — ROSUVASTATIN CALCIUM 10 MG PO TABS: 10.0000 mg | ORAL_TABLET | Freq: Every day | ORAL | 2 refills | Status: DC

## 2022-07-21 MED ORDER — TOPIRAMATE 100 MG PO TABS
100.0000 mg | ORAL_TABLET | Freq: Every day | ORAL | 1 refills | Status: DC
Start: 2022-07-21 — End: 2022-09-25

## 2022-07-21 MED ORDER — METFORMIN HCL ER 500 MG PO TB24: 500.0000 mg | ORAL_TABLET | Freq: Every day | ORAL | 1 refills | Status: DC

## 2022-07-21 MED ORDER — AMITRIPTYLINE HCL 50 MG PO TABS: 50.0000 mg | ORAL_TABLET | Freq: Every day | ORAL | 1 refills | Status: DC

## 2022-07-21 MED ORDER — ACETAMINOPHEN 325 MG PO TABS: 650.0000 mg | ORAL_TABLET | Freq: Four times a day (QID) | ORAL | Status: DC | PRN

## 2022-07-21 MED ORDER — PREDNISONE 10 MG PO TABS: ORAL_TABLET | ORAL | Status: DC

## 2022-07-21 MED ORDER — PANTOPRAZOLE SODIUM 40 MG PO TBEC: 40.0000 mg | DELAYED_RELEASE_TABLET | Freq: Every day | ORAL | 1 refills | Status: DC

## 2022-07-21 MED ORDER — FOLIC ACID 1 MG PO TABS: 2.0000 mg | ORAL_TABLET | Freq: Every day | ORAL | 1 refills | Status: AC

## 2022-07-21 MED ORDER — VITAMIN D (ERGOCALCIFEROL) 1.25 MG (50000 UNIT) PO CAPS: 50000.0000 [IU] | ORAL_CAPSULE | ORAL | 1 refills | Status: DC

## 2022-07-21 MED ORDER — HYDROCHLOROTHIAZIDE 12.5 MG PO TABS: 12.5000 mg | ORAL_TABLET | Freq: Every day | ORAL | 0 refills | Status: DC

## 2022-07-21 MED ORDER — AMLODIPINE BESYLATE 10 MG PO TABS: 10.0000 mg | ORAL_TABLET | Freq: Every day | ORAL | Status: DC

## 2022-07-21 NOTE — Telephone Encounter (Signed)
Tried giving patient a return call unable to reach her

## 2022-07-21 NOTE — Telephone Encounter (Signed)
Pt returning call

## 2022-07-21 NOTE — Telephone Encounter (Signed)
Spoke to pt filled medications she asked for gabepentin to be switched from 2 a day to 3 a day, also wants a refill on hydrocodone/acetiminophen, methocarbamol, and bactrim, please advice on these medications as some are controlled?

## 2022-07-23 ENCOUNTER — Other Ambulatory Visit: Payer: Self-pay | Admitting: Family Medicine

## 2022-07-23 DIAGNOSIS — M792 Neuralgia and neuritis, unspecified: Secondary | ICD-10-CM

## 2022-07-23 DIAGNOSIS — G629 Polyneuropathy, unspecified: Secondary | ICD-10-CM

## 2022-07-23 MED ORDER — GABAPENTIN 300 MG PO CAPS: 600.0000 mg | ORAL_CAPSULE | Freq: Three times a day (TID) | ORAL | 1 refills | Status: DC

## 2022-07-23 MED ORDER — METHOCARBAMOL 500 MG PO TABS: 500.0000 mg | ORAL_TABLET | Freq: Four times a day (QID) | ORAL | 1 refills | Status: DC | PRN

## 2022-07-24 NOTE — Telephone Encounter (Signed)
Upon calling pt to inform her of refills, pt asked if an repeat MRI of right hip can be ordered to see if it has got any better, please advice?

## 2022-07-25 ENCOUNTER — Telehealth: Payer: Self-pay | Admitting: Family Medicine

## 2022-07-25 NOTE — Telephone Encounter (Signed)
Pt called stating that she is needing a copy of her medication list send to her dr at [facility].   Fax # [phone]

## 2022-07-25 NOTE — Telephone Encounter (Signed)
Med list faxed

## 2022-07-26 ENCOUNTER — Other Ambulatory Visit: Payer: Self-pay

## 2022-07-26 ENCOUNTER — Telehealth: Payer: Self-pay | Admitting: Family Medicine

## 2022-07-26 DIAGNOSIS — M792 Neuralgia and neuritis, unspecified: Secondary | ICD-10-CM

## 2022-07-26 DIAGNOSIS — G629 Polyneuropathy, unspecified: Secondary | ICD-10-CM

## 2022-07-26 MED ORDER — GABAPENTIN 300 MG PO CAPS: 600.0000 mg | ORAL_CAPSULE | Freq: Three times a day (TID) | ORAL | 2 refills | Status: DC

## 2022-07-26 NOTE — Telephone Encounter (Signed)
Pt called stating phar has still not received the rx for gabapentin (NEURONTIN) 300 MG capsule. Can you please resend?   [employer]

## 2022-07-26 NOTE — Telephone Encounter (Signed)
Refill re-sent  

## 2022-07-26 NOTE — Telephone Encounter (Signed)
Please encourage the patient to follow up with orthopedics regarding a repeat MRI of the right hip.  She had an MRI of the right hip on 02/20/2022.

## 2022-07-27 NOTE — Telephone Encounter (Signed)
Pt informed stated understanding.  

## 2022-08-02 ENCOUNTER — Telehealth: Payer: Self-pay | Admitting: Family Medicine

## 2022-08-02 NOTE — Telephone Encounter (Signed)
Pt called stating her hair is coming out by the hand fulls. Wants to know what to do?

## 2022-08-04 ENCOUNTER — Other Ambulatory Visit: Payer: Self-pay | Admitting: Family Medicine

## 2022-08-04 DIAGNOSIS — E038 Other specified hypothyroidism: Secondary | ICD-10-CM

## 2022-08-04 NOTE — Telephone Encounter (Signed)
Please encouraged the patient to come in for labs, I want to assess her thyroid levels

## 2022-08-04 NOTE — Telephone Encounter (Signed)
Rubs her head and hair has just falls out has been happening for about 2-3 weeks, sx are worsening. Please advice?

## 2022-08-04 NOTE — Telephone Encounter (Signed)
Left detailed message.   

## 2022-08-07 ENCOUNTER — Encounter: Payer: BC Managed Care – PPO | Attending: Family Medicine | Admitting: Nutrition

## 2022-08-07 DIAGNOSIS — M313 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: Secondary | ICD-10-CM | POA: Insufficient documentation

## 2022-08-07 DIAGNOSIS — I10 Essential (primary) hypertension: Secondary | ICD-10-CM | POA: Diagnosis not present

## 2022-08-07 DIAGNOSIS — E66812 Obesity, class 2: Secondary | ICD-10-CM

## 2022-08-07 DIAGNOSIS — A5002 Early congenital syphilitic osteochondropathy: Secondary | ICD-10-CM | POA: Diagnosis not present

## 2022-08-07 DIAGNOSIS — E669 Obesity, unspecified: Secondary | ICD-10-CM

## 2022-08-07 DIAGNOSIS — E118 Type 2 diabetes mellitus with unspecified complications: Secondary | ICD-10-CM

## 2022-08-07 DIAGNOSIS — E119 Type 2 diabetes mellitus without complications: Secondary | ICD-10-CM | POA: Diagnosis not present

## 2022-08-07 DIAGNOSIS — M9080 Osteopathy in diseases classified elsewhere, unspecified site: Secondary | ICD-10-CM | POA: Diagnosis not present

## 2022-08-07 DIAGNOSIS — E639 Nutritional deficiency, unspecified: Secondary | ICD-10-CM | POA: Diagnosis present

## 2022-08-07 DIAGNOSIS — N1831 Chronic kidney disease, stage 3a: Secondary | ICD-10-CM

## 2022-08-07 NOTE — Progress Notes (Unsigned)
Medical Nutrition Therapy  Appointment Start time:  1330  Appointment End time:  1500  Primary concerns today:  Nutritional Deficiency, Type 2 DM Referral diagnosis: E63.9, E11.8 Preferred learning style: No preference. Learning readiness: Ready   NUTRITION ASSESSMENT  56 year old wfemale brought in by her father in a wheelchair. She is unable to walk. PMH: Granulomatosis with polyangiitis, HTN, Type 2 DM. 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 She notes that is what causes her blood sugar to be high.  She notes she usually eats 2 meals per day, depending on what is going on.  A1C 8.6%. Currently taking only 1- 500 mg of Metformin a day. Not testing blood sugars on a regular basis.  Her diet is inconsistent to meet her needs currently and is high in simple sugars. She is willing to work on her diet to improve her overall health and her DM.   Anthropometrics  Didn't weigh today due to her not being able to stand well.   Clinical Medical Hx: See chart  Medications: Metformin 500 mg once a day. Labs:  Lab Results  Component Value Date   HGBA1C 8.6 (H) 03/26/2022      Latest Ref Rng & Units 06/29/2022    4:41 PM 05/18/2022    2:41 AM 05/17/2022    3:49 AM  CMP  Glucose 70 - 99 mg/dL 189  112  119   BUN 6 - 24 mg/dL 17  37  34   Creatinine 0.57 - 1.00 mg/dL 1.07  1.58  1.20   Sodium 134 - 144 mmol/L 140  137  136   Potassium 3.5 - 5.2 mmol/L 4.4  4.1  4.1   Chloride 96 - 106 mmol/L 106  106  105   CO2 20 - 29 mmol/L _0 Calcium 8.7 - 10.2 mg/dL 9.3  8.7  8.8    Notable Signs/Symptoms: tired,   Lifestyle & Dietary Hx Lives with her husband and dog. Wegner's Disease-breathing automimmune.   Estimated daily fluid intake: 60 oz Supplements: Calcium and VMI Sleep: 4-5 hrs Stress / self-care: Health Current average weekly physical activity: In  wheelchair.  24-Hr Dietary Recall First Meal: O's, nut, or captain crunch with almond milk or oatmeal with brown sugar and craisins,  Snack:  Second Meal: Ham and broccoli casserole, deviled egg and potato salad, water Snack:  Third Meal: Grilled chicken, bake potato, salad and mac/cheese, water Snack: occasionally ice cream Beverages: water with crystal light  Estimated Energy Needs Calories: 1200-1500 Carbohydrate: 170g Protein: 112g Fat: 42g   NUTRITION DIAGNOSIS  NB-1.1 Food and nutrition-related knowledge deficit As related to Diabetes Type 2.  As evidenced by a1c 8.6%.   NUTRITION INTERVENTION  Nutrition education (E-1) on the following topics:  Nutrition and Diabetes education provided on My Plate, CHO counting, meal planning, portion sizes, timing of meals, avoiding snacks between meals unless having a low blood sugar, target ranges for A1C and blood sugars, signs/symptoms and treatment of hyper/hypoglycemia, monitoring blood sugars, taking medications as prescribed, benefits of exercising 30 minutes per day and prevention of complications of DM.   Lifestyle Medicine  - Whole Food, Plant Predominant Nutrition is highly recommended: Eat Plenty of vegetables, Mushrooms, fruits, Legumes, Whole Grains, Nuts, seeds in lieu of processed meats, processed snacks/pastries red meat, poultry, eggs.    -It is better to avoid simple carbohydrates including: Cakes, Sweet Desserts,  Ice Cream, Soda (diet and regular), Sweet Tea, Candies, Chips, Cookies, Store Bought Juices, Alcohol in Excess of  1-2 drinks a day, Lemonade,  Artificial Sweeteners, Doughnuts, Coffee Creamers, "Sugar-free" Products, etc, etc.  This is not a complete list.....  Exercise: If you are able: 30 -60 minutes a day ,4 days a week, or 150 minutes a week.  The longer the better.  Combine stretch, strength, and aerobic activities.  If you were told in the past that you have high risk for cardiovascular diseases, you may  seek evaluation by your heart doctor prior to initiating moderate to intense exercise programs.   Handouts Provided Include  Lifestyle Medicine handouts   Learning Style & Readiness for Change Teaching method utilized: Visual & Auditory  Demonstrated degree of understanding via: Teach Back  Barriers to learning/adherence to lifestyle change: mobility for exercise.  Goals Established by Pt Goals  Focus on whole food plant based meals Drink only water and cut out crystal light. Eat three meals per day B)6-8 L)12-2 and D)5-7. Test blood sugars 4 times per day for now. Keep a food journal Get A1C down to 7% Avoid sweets, soda, sugared cereals, and processed foods   MONITORING & EVALUATION Dietary intake, weekly physical activity, and blood sugars in 1 month.  Next Steps  Patient is to work on eating whole plant based foods.Marland Kitchen

## 2022-08-07 NOTE — Patient Instructions (Signed)
Goals  Focus on whole food plant based meals Drink only water and cut out crystal light. Eat three meals per day B)6-8 L)12-2 and D)5-7. Test blood sugars 4 times per day for now. Keep a food journarl Get A1C down to 7% Avoid sweets, soda, sugared cereals, and processed foods

## 2022-08-08 ENCOUNTER — Encounter: Payer: Self-pay | Admitting: Family Medicine

## 2022-08-08 ENCOUNTER — Encounter: Payer: Self-pay | Admitting: Nutrition

## 2022-08-08 ENCOUNTER — Ambulatory Visit (INDEPENDENT_AMBULATORY_CARE_PROVIDER_SITE_OTHER): Payer: BC Managed Care – PPO | Admitting: Family Medicine

## 2022-08-08 DIAGNOSIS — E038 Other specified hypothyroidism: Secondary | ICD-10-CM | POA: Diagnosis not present

## 2022-08-08 DIAGNOSIS — J189 Pneumonia, unspecified organism: Secondary | ICD-10-CM

## 2022-08-08 LAB — TSH+FREE T4
Free T4: 0.78 ng/dL — ABNORMAL LOW (ref 0.82–1.77)
TSH: 3.08 u[IU]/mL (ref 0.450–4.500)

## 2022-08-08 MED ORDER — AZITHROMYCIN 250 MG PO TABS: ORAL_TABLET | ORAL | 0 refills | Status: AC

## 2022-08-08 NOTE — Progress Notes (Signed)
Virtual Visit via Telephone Note   This visit type was conducted via telephone. This format is felt to be most appropriate for this patient at this time.  The patient did not have access to video technology/had technical difficulties with video requiring transitioning to audio format only (telephone).  All issues noted in this document were discussed and addressed.  No physical exam could be performed with this format.  Evaluation Performed:  Follow-up visit  Date:  08/08/2022   ID:  [name], DOB Feb 24, 1966, MRN [id number]  Patient Location: Home Provider Location: Clinic  Participants: Patient Location of Patient: Home Location of Provider: clinic Consent was obtain for visit to be over via telehealth. I verified that I am speaking with the correct person using two identifiers.  PCP:  [name], [name]   Chief Complaint: Cough with fever  History of Present Illness:    [name] is a 56 y.o. female with complaints of cough with green phlegm, fever and chills, congestion, body aches, headaches for 3 days.  She reports taking Mucinex, and Delsym cough with minimal relief.  She reports that the last time she was tested for COVID was in July 2023. She complains of hair loss that has been going on for 2 to 3 weeks. Will reveal thyroid levels with patient today.  The patient does have symptoms concerning for COVID-19 infection (fever, chills, cough, or new shortness of breath).   Past Medical, Surgical, Social History, Allergies, and Medications have been Reviewed.  Past Medical History:  Diagnosis Date   Chronic cough    Chronic pain    Diabetes mellitus without complication (HCC)    Dyspnea    GERD (gastroesophageal reflux disease)    Hypokalemia    Hypothyroidism    Left wrist pain 06/01/2021   Myonecrosis (HCC) 07/01/2021   Neurocardiogenic syncope    OSA (obstructive sleep apnea)    does not use CPAP   Osteomyelitis (HCC)    bilateral feet   Peripheral  neuropathy    Presence of intrathecal pump    recieves Prialt/bupivicaine   Tear of gluteus medius tendon 07/01/2021   Wegener's granulomatosis 2009   Wegner's disease (congenital syphilitic osteochondritis)    Past Surgical History:  Procedure Laterality Date   AMPUTATION Right 10/21/2020   Procedure: Right hallux amputation;  Surgeon: Toni Arthurs, MD;  Location: [facility];  Service: Orthopedics;  Laterality: Right;   AMPUTATION TOE Bilateral 08/12/2020   Procedure: Right 3rd toe amputation; left hallux and 3rd toe amputation;  Surgeon: Toni Arthurs, MD;  Location: [facility];  Service: Orthopedics;  Laterality: Bilateral;    AMPUTATION TOE Right 03/24/2021   Procedure: AMPUTATION TOE;  Surgeon: Toni Arthurs, MD;  Location: [facility];  Service: Orthopedics;  Laterality: Right;   APPENDECTOMY     BACK SURGERY     BIOPSY  03/30/2022   Procedure: BIOPSY;  Surgeon: Tomma Lightning, MD;  Location: [facility];  Service: Pulmonary;;   BRONCHIAL WASHINGS  03/30/2022   Procedure: BRONCHIAL WASHINGS;  Surgeon: Tomma Lightning, MD;  Location: [facility];  Service: Pulmonary;;   CHOLECYSTECTOMY     COLONOSCOPY N/A 06/02/2013   Procedure: COLONOSCOPY;  Surgeon: Graylin Shiver, MD;  Location[facility];  Service: Endoscopy;  Laterality: N/A;   HERNIA REPAIR  2000   INCISION AND DRAINAGE ABSCESS Left 07/07/2016   Procedure: DEBRIDMENT LEFT THIGH ABSCESS, EXISION ACUTE SKIN RASH LEFT THIGH(1CM LESION);  Surgeon: Claud Kelp, MD;  Location: [facility];  Service: General;  Laterality: Left;   IR FLUORO GUIDE CV LINE RIGHT  04/24/2022   IR IVC FILTER PLMT / S&I /IMG GUID/MOD SED  05/17/2022   IR US GUIDE VASC ACCESS RIGHT  04/24/2022   SEPTOPLASTY  02/2013   spleen anuyism     splenic aneurysm     TRANSMETATARSAL AMPUTATION Bilateral 10/21/2020   Procedure: Left transmetatarsal amputation;  Surgeon: Wylene Simmer, MD;  Location: [facility];  Service:  Orthopedics;  Laterality: Bilateral;   TRANSMETATARSAL AMPUTATION Left 03/24/2021   Procedure: TRANSMETATARSAL AMPUTATION;  Surgeon: Wylene Simmer, MD;  Location: [place] [facility];  Service: Orthopedics;  Laterality: Left;   VAGINAL HYSTERECTOMY     VIDEO BRONCHOSCOPY Right 03/30/2022   Procedure: VIDEO BRONCHOSCOPY WITH FLUORO;  Surgeon: Laurin Coder, MD;  Location: [facility];  Service: Pulmonary;  Laterality: Right;  atypical pneumonia     Current Meds  Medication Sig   acetaminophen (TYLENOL) 325 MG tablet Take 2 tablets (650 mg total) by mouth every 6 (six) hours as needed for mild pain (or Fever >/= 101).   albuterol (VENTOLIN HFA) 108 (90 Base) MCG/ACT inhaler Inhale 2 puffs into the lungs every 6 (six) hours as needed for wheezing or shortness of breath.   amitriptyline (ELAVIL) 50 MG tablet Take 1 tablet (50 mg total) by mouth at bedtime.   amLODipine (NORVASC) 10 MG tablet Take 1 tablet (10 mg total) by mouth daily.   apixaban (ELIQUIS) 5 MG TABS tablet Take 1 tablet (5 mg total) by mouth 2 (two) times daily.   azithromycin (ZITHROMAX) 250 MG tablet Take 2 tablets on day 1, then 1 tablet daily on days 2 through 5   Calcium Carb-Cholecalciferol (CALCIUM CARBONATE-VITAMIN D3 PO) Take 1 tablet by mouth daily.   DULoxetine (CYMBALTA) 60 MG capsule Take 60 mg by mouth 2 (two) times daily.   folic acid (FOLVITE) 1 MG tablet Take 2 tablets (2 mg total) by mouth daily.   gabapentin (NEURONTIN) 300 MG capsule Take 2 capsules (600 mg total) by mouth 3 (three) times daily.   guaiFENesin (MUCINEX) 600 MG 12 hr tablet Take 1 tablet (600 mg total) by mouth 2 (two) times daily.   HYDROcodone-acetaminophen (NORCO/VICODIN) 5-325 MG tablet Take 1 tablet by mouth 2 (two) times daily as needed.   lidocaine (LIDODERM) 5 % Place 1 patch onto the skin daily as needed (for pain). Remove & Discard patch within 12 hours or as directed by MD   linaclotide (LINZESS) 145 MCG CAPS capsule Take 1 capsule (145 mcg total)  by mouth daily before breakfast.   magnesium oxide (MAG-OX) 400 MG tablet Take 400 mg by mouth daily.   Melatonin 10 MG TABS Take 10 mg by mouth at bedtime as needed (sleep).   metFORMIN (GLUCOPHAGE-XR) 500 MG 24 hr tablet Take 1 tablet (500 mg total) by mouth daily with breakfast.   methocarbamol (ROBAXIN) 500 MG tablet Take 1 tablet (500 mg total) by mouth every 6 (six) hours as needed for muscle spasms.   METHOTREXATE SODIUM IJ Inject 25 mg into the skin every Sunday.   naloxone (NARCAN) nasal spray 4 mg/0.1 mL Place 0.4 mg into the nose once.   pantoprazole (PROTONIX) 40 MG tablet Take 1 tablet (40 mg total) by mouth daily.   Potassium Chloride Crys ER (K-DUR PO) Take 40 mEq by mouth every other day.   predniSONE (DELTASONE) 10 MG tablet Take 1 tab (10mg ) daily for 2 days, then reduce to  your home dose of 5mg  daily   rosuvastatin (CRESTOR) 10 MG tablet Take 1 tablet (10 mg total) by mouth at bedtime.   senna (SENOKOT) 8.6 MG TABS tablet Take 2 tablets (17.2 mg total) by mouth 2 (two) times daily.   sulfamethoxazole-trimethoprim (BACTRIM) 400-80 MG tablet Take 1 tablet by mouth 3 (three) times a week. Monday,Wednesday and Friday   topiramate (TOPAMAX) 100 MG tablet Take 1 tablet (100 mg total) by mouth daily.   triamcinolone cream (KENALOG) 0.1 % Apply 1 Application topically 2 (two) times daily.   Vitamin D, Ergocalciferol, (DRISDOL) 1.25 MG (50000 UNIT) CAPS capsule Take 1 capsule (50,000 Units total) by mouth every Friday.     Allergies:   Ibuprofen and Penicillins   ROS:   Please see the history of present illness.     All other systems reviewed and are negative.   Labs/Other Tests and Data Reviewed:    Recent Labs: 04/02/2022: B Natriuretic Peptide 50.8 05/09/2022: ALT 19 05/17/2022: Magnesium 1.3 06/29/2022: BUN 17; Creatinine, Ser 1.07; Hemoglobin 11.2; Platelets 314; Potassium 4.4; Sodium 140 08/07/2022: TSH 3.080   Recent Lipid Panel No results found for: "CHOL", "TRIG",  "HDL", "CHOLHDL", "LDLCALC", "LDLDIRECT"  Wt Readings from Last 3 Encounters:  06/27/22 188 lb (85.3 kg)  05/09/22 211 lb 13.8 oz (96.1 kg)  04/12/22 199 lb 9.6 oz (90.5 kg)     Objective:    Vital Signs:  There were no vitals taken for this visit.     ASSESSMENT & PLAN:   Community-acquired pneumonia Will treat prophylactically with azithromycin for 5 days, given the patient allergies to penicillin Encouraged the patient to get a chest x-ray as soon as possible to confirm the diagnosis Encouraged taking Tylenol as needed for headaches fever and body aches Encouraged to take over-the-counter Robitussin for cough Encouraged to take over-the-counter hair skin and nails supplement for hair loss Encouraged to return for labs in 6 weeks to assess her thyroid levels as her TSH was high normal and her T4 was slightly low    Time:   Today, I have spent 17 minutes reviewing the chart, including problem list, medications, and with the patient with telehealth technology discussing the above problems.   Medication Adjustments/Labs and Tests Ordered: Current medicines are reviewed at length with the patient today.  Concerns regarding medicines are outlined above.   Tests Ordered: Orders Placed This Encounter  Procedures   DG Chest 2 View   TSH+T4F+T3Free    Medication Changes: Meds ordered this encounter  Medications   azithromycin (ZITHROMAX) 250 MG tablet    Sig: Take 2 tablets on day 1, then 1 tablet daily on days 2 through 5    Dispense:  6 tablet    Refill:  0     Note: This dictation was prepared with Dragon dictation along with smaller phrase technology. Similar sounding words can be transcribed inadequately or may not be corrected upon review. Any transcriptional errors that result from this process are unintentional.      Disposition:  Follow up  Signed, 04/14/22, [name]  08/08/2022 5:38 PM     08/10/2022 [facility]

## 2022-08-09 ENCOUNTER — Ambulatory Visit (HOSPITAL_COMMUNITY)
Admission: RE | Admit: 2022-08-09 | Discharge: 2022-08-09 | Disposition: A | Discharge status: Home / Self Care | Payer: BC Managed Care – PPO | Source: Ambulatory Visit | Attending: Family Medicine | Admitting: Family Medicine

## 2022-08-09 ENCOUNTER — Ambulatory Visit: Payer: BC Managed Care – PPO

## 2022-08-09 ENCOUNTER — Other Ambulatory Visit: Payer: Self-pay | Admitting: Family Medicine

## 2022-08-09 DIAGNOSIS — E1100 Type 2 diabetes mellitus with hyperosmolarity without nonketotic hyperglycemic-hyperosmolar coma (NKHHC): Secondary | ICD-10-CM

## 2022-08-09 DIAGNOSIS — J189 Pneumonia, unspecified organism: Secondary | ICD-10-CM | POA: Diagnosis present

## 2022-08-09 DIAGNOSIS — R059 Cough, unspecified: Secondary | ICD-10-CM

## 2022-08-11 ENCOUNTER — Telehealth: Payer: Self-pay | Admitting: Family Medicine

## 2022-08-11 LAB — NOVEL CORONAVIRUS, NAA: SARS-CoV-2, NAA: NOT DETECTED

## 2022-08-11 NOTE — Progress Notes (Signed)
Please inform the patient that her x-ray showed that her pneumonia infection has resolved.

## 2022-08-11 NOTE — Progress Notes (Signed)
Please inform the patient that her COVID test was negative

## 2022-08-11 NOTE — Telephone Encounter (Signed)
Patient called in for labs

## 2022-08-14 ENCOUNTER — Telehealth: Payer: Self-pay

## 2022-08-14 NOTE — Telephone Encounter (Signed)
Patient calling about lab results. Please call patient before 10:00 am.

## 2022-08-14 NOTE — Telephone Encounter (Signed)
Pt informed of labs

## 2022-08-14 NOTE — Telephone Encounter (Signed)
Pt informed

## 2022-08-26 ENCOUNTER — Other Ambulatory Visit: Payer: Self-pay | Admitting: Family Medicine

## 2022-08-29 NOTE — Telephone Encounter (Signed)
Please do not refill the antibiotic

## 2022-09-22 ENCOUNTER — Other Ambulatory Visit: Payer: Self-pay | Admitting: Family Medicine

## 2022-09-22 DIAGNOSIS — G629 Polyneuropathy, unspecified: Secondary | ICD-10-CM

## 2022-09-22 DIAGNOSIS — M792 Neuralgia and neuritis, unspecified: Secondary | ICD-10-CM

## 2022-09-26 ENCOUNTER — Ambulatory Visit: Payer: BC Managed Care – PPO | Admitting: Family Medicine

## 2022-09-29 ENCOUNTER — Ambulatory Visit (INDEPENDENT_AMBULATORY_CARE_PROVIDER_SITE_OTHER): Payer: BC Managed Care – PPO | Admitting: Family Medicine

## 2022-09-29 ENCOUNTER — Encounter: Payer: Self-pay | Admitting: Family Medicine

## 2022-09-29 VITALS — BP 103/69 | HR 82 | Ht 66.0 in | Wt 212.0 lb

## 2022-09-29 DIAGNOSIS — R058 Other specified cough: Secondary | ICD-10-CM

## 2022-09-29 DIAGNOSIS — R059 Cough, unspecified: Secondary | ICD-10-CM | POA: Diagnosis not present

## 2022-09-29 DIAGNOSIS — B356 Tinea cruris: Secondary | ICD-10-CM

## 2022-09-29 DIAGNOSIS — E7849 Other hyperlipidemia: Secondary | ICD-10-CM

## 2022-09-29 DIAGNOSIS — E038 Other specified hypothyroidism: Secondary | ICD-10-CM

## 2022-09-29 DIAGNOSIS — Z23 Encounter for immunization: Secondary | ICD-10-CM | POA: Diagnosis not present

## 2022-09-29 DIAGNOSIS — R7301 Impaired fasting glucose: Secondary | ICD-10-CM

## 2022-09-29 DIAGNOSIS — E559 Vitamin D deficiency, unspecified: Secondary | ICD-10-CM

## 2022-09-29 MED ORDER — PROMETHAZINE-DM 6.25-15 MG/5ML PO SYRP: 5.0000 mL | ORAL_SOLUTION | Freq: Four times a day (QID) | ORAL | 0 refills | Status: DC | PRN

## 2022-09-29 MED ORDER — CLOTRIMAZOLE 1 % EX CREA: 1.0000 | TOPICAL_CREAM | Freq: Two times a day (BID) | CUTANEOUS | 0 refills | Status: DC

## 2022-09-29 NOTE — Patient Instructions (Addendum)
I appreciate the opportunity to provide care to you today!    Follow up:  3 months  Labs: please stop by the lab during the week to get your blood drawn (CBC, CMP, TSH, Lipid profile, HgA1c, Vit D)  Please start taking Promethazine DM for cough, congestion, and cold-like symptoms. I have sent you a prescription for topical clotrimazole cream to apply to the rash twice daily   Please pick up your medications at the pharmacy.                                 MERRY CHRISTMAS   Please continue to a heart-healthy diet and increase your physical activities. Try to exercise for at least three times a week.      It was a pleasure to see you and I look forward to continuing to work together on your health and well-being. Please do not hesitate to call the office if you need care or have questions about your care.   Have a wonderful day and week. With Gratitude, [name] MSN, [name]-BC

## 2022-09-29 NOTE — Progress Notes (Unsigned)
Established Patient Office Visit  Subjective:  Patient ID: [name], female    DOB: 07-13-1966  Age: 56 y.o. MRN: [id number]  CC:  Chief Complaint  Patient presents with   Follow-up    Pt states she has been following up with ortho. Also reports cough that has not gone away. Needs refills today.   Rash    Pt reports rash on the crease of her legs, noticed it this morning, pt reports burning feeling.     HPI [name] is a 56 y.o. female with past medical history of essential hypertension, aortic atherosclerosis, and obstructive sleep apnea presents for f/u of  chronic medical conditions. For the details of today's visit, please refer to the assessment and plan.    Past Medical History:  Diagnosis Date   Chronic cough    Chronic pain    Diabetes mellitus without complication (HCC)    Dyspnea    GERD (gastroesophageal reflux disease)    Hypokalemia    Hypothyroidism    Left wrist pain 06/01/2021   Myonecrosis ([place]) 07/01/2021   Neurocardiogenic syncope    OSA (obstructive sleep apnea)    does not use CPAP   Osteomyelitis (HCC)    bilateral feet   Peripheral neuropathy    Presence of intrathecal pump    recieves Prialt/bupivicaine   Tear of gluteus medius tendon 07/01/2021   Wegener's granulomatosis 2009   Wegner's disease (congenital syphilitic osteochondritis)     Past Surgical History:  Procedure Laterality Date   AMPUTATION Right 10/21/2020   Procedure: Right hallux amputation;  Surgeon: Wylene Simmer, MD;  Location: [place];  Service: Orthopedics;  Laterality: Right;   AMPUTATION TOE Bilateral 08/12/2020   Procedure: Right 3rd toe amputation; left hallux and 3rd toe amputation;  Surgeon: Wylene Simmer, MD;  Location: [place];  Service: Orthopedics;  Laterality: Bilateral;  18mn   AMPUTATION TOE Right 03/24/2021   Procedure: AMPUTATION TOE;  Surgeon: HWylene Simmer MD;  [place] [facility]  Service: Orthopedics;  Laterality: Right;    APPENDECTOMY     BACK SURGERY     BIOPSY  03/30/2022   Procedure: BIOPSY;  Surgeon: OLaurin Coder MD;  [place] [facility]  Service: Pulmonary;;   BRONCHIAL WASHINGS  03/30/2022   Procedure: BRONCHIAL WASHINGS;  Surgeon: OLaurin Coder MD;  [place] [facility]  Service: Pulmonary;;   CHOLECYSTECTOMY     COLONOSCOPY N/A 06/02/2013   Procedure: COLONOSCOPY;  Surgeon: SWonda Horner MD;  [facility];  Service: Endoscopy;  Laterality: N/A;   HERNIA REPAIR  2000   INCISION AND DRAINAGE ABSCESS Left 07/07/2016   Procedure: DEBRIDMENT LEFT THIGH ABSCESS, EXISION ACUTE SKIN RASH LEFT THIGH(1CM LESION);  Surgeon: HFanny Skates MD;  [facility];  Service: General;  Laterality: Left;   IR FLUORO GUIDE CV LINE RIGHT  04/24/2022   IR IVC FILTER PLMT / S&I /IMG GUID/MOD SED  05/17/2022   IR [place] VASC ACCESS RIGHT  04/24/2022   SEPTOPLASTY  02/2013   spleen anuyism     splenic aneurysm     TRANSMETATARSAL AMPUTATION Bilateral 10/21/2020   Procedure: Left transmetatarsal amputation;  Surgeon: HWylene Simmer MD;  [place] [facility]  Service: Orthopedics;  Laterality: Bilateral;   TRANSMETATARSAL AMPUTATION Left 03/24/2021   Procedure: TRANSMETATARSAL AMPUTATION;  Surgeon: HWylene Simmer MD;  [place] [facility]  Service: Orthopedics;  Laterality: Left;   VAGINAL HYSTERECTOMY     VIDEO BRONCHOSCOPY Right 03/30/2022  Procedure: VIDEO BRONCHOSCOPY WITH FLUORO;  Surgeon: Laurin Coder, MD;  Location: [facility] [place] [facility];  Service: Pulmonary;  Laterality: Right;  atypical pneumonia    Family History  Problem Relation Age of Onset   Diabetes Mother    Heart disease Mother    Diabetes Father     Social History   Socioeconomic History   Marital status: Married    Spouse name: Not on file   Number of children: Not on file   Years of education: Not on file   Highest education level: Not on file  Occupational History   Not on file  Tobacco Use   Smoking status:  Never   Smokeless tobacco: Never  Vaping Use   Vaping Use: Never used  Substance and Sexual Activity   Alcohol use: Yes    Comment: occasionally   Drug use: No   Sexual activity: Yes    Birth control/protection: Surgical  Other Topics Concern   Not on file  Social History Narrative   Not on file   Social Determinants of Health   Financial Resource Strain: Not on file  Food Insecurity: Not on file  Transportation Needs: Not on file  Physical Activity: Not on file  Stress: Not on file  Social Connections: Not on file  Intimate Partner Violence: Not on file    Outpatient Medications Prior to Visit  Medication Sig Dispense Refill   acetaminophen (TYLENOL) 325 MG tablet Take 2 tablets (650 mg total) by mouth every 6 (six) hours as needed for mild pain (or Fever >/= 101).     albuterol (VENTOLIN HFA) 108 (90 Base) MCG/ACT inhaler Inhale 2 puffs into the lungs every 6 (six) hours as needed for wheezing or shortness of breath. 1 each 11   amitriptyline (ELAVIL) 50 MG tablet TAKE 1 TABLET BY MOUTH AT BEDTIME FOR SLEEP. 30 tablet 0   amLODipine (NORVASC) 10 MG tablet Take 1 tablet (10 mg total) by mouth daily.     apixaban (ELIQUIS) 5 MG TABS tablet Take 1 tablet (5 mg total) by mouth 2 (two) times daily.     Calcium Carb-Cholecalciferol (CALCIUM CARBONATE-VITAMIN D3 PO) Take 1 tablet by mouth daily.     DULoxetine (CYMBALTA) 60 MG capsule Take 60 mg by mouth 2 (two) times daily.     gabapentin (NEURONTIN) 300 MG capsule Take 2 capsules (600 mg total) by mouth 3 (three) times daily. 180 capsule 2   guaiFENesin (MUCINEX) 600 MG 12 hr tablet Take 1 tablet (600 mg total) by mouth 2 (two) times daily. 60 tablet 3   HYDROcodone-acetaminophen (NORCO/VICODIN) 5-325 MG tablet Take 1 tablet by mouth 2 (two) times daily as needed.     lidocaine (LIDODERM) 5 % Place 1 patch onto the skin daily as needed (for pain). Remove & Discard patch within 12 hours or as directed by MD     linaclotide  (LINZESS) 145 MCG CAPS capsule Take 1 capsule (145 mcg total) by mouth daily before breakfast. 30 capsule 1   magnesium oxide (MAG-OX) 400 MG tablet Take 400 mg by mouth daily.     Melatonin 10 MG TABS Take 10 mg by mouth at bedtime as needed (sleep).     metFORMIN (GLUCOPHAGE-XR) 500 MG 24 hr tablet TAKE 1 TABLET DAILY WITH BREAKFAST. 30 tablet 0   methocarbamol (ROBAXIN) 500 MG tablet Take 1 tablet (500 mg total) by mouth every 6 (six) hours as needed for muscle spasms. 30 tablet 0   METHOTREXATE SODIUM IJ Inject 25  mg into the skin every Sunday.     naloxone (NARCAN) nasal spray 4 mg/0.1 mL Place 0.4 mg into the nose once.     pantoprazole (PROTONIX) 40 MG tablet TAKE (1) TABLET BY MOUTH ONCE DAILY. 30 tablet 0   Potassium Chloride Crys ER (K-DUR PO) Take 40 mEq by mouth every other day.     predniSONE (DELTASONE) 10 MG tablet Take 1 tab (83m) daily for 2 days, then reduce to your home dose of 525mdaily     predniSONE (DELTASONE) 5 MG tablet TAKE 1 TABLET BY MOUTH ONCE DAILY. 30 tablet 0   rosuvastatin (CRESTOR) 10 MG tablet Take 1 tablet (10 mg total) by mouth at bedtime. 30 tablet 2   senna (SENOKOT) 8.6 MG TABS tablet Take 2 tablets (17.2 mg total) by mouth 2 (two) times daily. 30 tablet 0   sulfamethoxazole-trimethoprim (BACTRIM) 400-80 MG tablet Take 1 tablet by mouth 3 (three) times a week. Monday,Wednesday and Friday     topiramate (TOPAMAX) 100 MG tablet TAKE (1) TABLET BY MOUTH ONCE DAILY. 30 tablet 0   triamcinolone cream (KENALOG) 0.1 % Apply 1 Application topically 2 (two) times daily. 30 g 0   Vitamin D, Ergocalciferol, (DRISDOL) 1.25 MG (50000 UNIT) CAPS capsule Take 1 capsule (50,000 Units total) by mouth every Friday. 5 capsule 1   hydrochlorothiazide (HYDRODIURIL) 12.5 MG tablet Take 1 tablet (12.5 mg total) by mouth daily for 14 days. 14 tablet 0   No facility-administered medications prior to visit.    Allergies  Allergen Reactions   Ibuprofen Other (See Comments)     Pt is unable to take this due to kidney problems.      Penicillins Rash and Other (See Comments)    Has patient had a PCN reaction causing immediate rash, facial/tongue/throat swelling, SOB or lightheadedness with hypotension: No Has patient had a PCN reaction causing severe rash involving mucus membranes or skin necrosis: No Has patient had a PCN reaction that required hospitalization No Has patient had a PCN reaction occurring within the last 10 years: No If all of the above answers are "NO", then may proceed with Cephalosporin use.    ROS Review of Systems  Respiratory:  Positive for cough.       Objective:    Physical Exam  BP 103/69   Pulse 82   Ht _0  (1.676 m)   Wt 212 lb 0.6 oz (96.2 kg)   SpO2 93%   BMI 34.22 kg/m  Wt Readings from Last 3 Encounters:  09/29/22 212 lb 0.6 oz (96.2 kg)  06/27/22 188 lb (85.3 kg)  05/09/22 211 lb 13.8 oz (96.1 kg)    Lab Results  Component Value Date   TSH 3.080 08/07/2022   Lab Results  Component Value Date   WBC 12.8 (H) 06/29/2022   HGB 11.2 06/29/2022   HCT 35.1 06/29/2022   MCV 91 06/29/2022   PLT 314 06/29/2022   Lab Results  Component Value Date   NA 140 06/29/2022   K 4.4 06/29/2022   CO2 19 (L) 06/29/2022   GLUCOSE 189 (H) 06/29/2022   BUN 17 06/29/2022   CREATININE 1.07 (H) 06/29/2022   BILITOT 1.2 05/09/2022   ALKPHOS 88 05/09/2022   AST 16 05/09/2022   ALT 19 05/09/2022   PROT 5.0 (L) 05/09/2022   ALBUMIN 2.8 (L) 05/18/2022   CALCIUM 9.3 06/29/2022   ANIONGAP 13 05/18/2022   EGFR 61 06/29/2022   No results found for: "CHOL" No  results found for: "HDL" No results found for: "LDLCALC" No results found for: "TRIG" No results found for: "CHOLHDL" Lab Results  Component Value Date   HGBA1C 8.6 (H) 03/26/2022      Assessment & Plan:  Tinea of groin Assessment & Plan: She complains of an erythematous pruritic rash in the creases of the groin We will treat with topical antifungal  cream Clotrimazole 1% cream order  Orders: -     Clotrimazole; Apply 1 Application topically 2 (two) times daily.  Dispense: 30 g; Refill: 0  Other cough Assessment & Plan: She complains of a lingering cough after upper respiratory infection We will treat with Promethazine DM   Need for immunization against influenza Assessment & Plan: Patient educated on CDC recommendation for the vaccine. Verbal consent was obtained from the patient, vaccine administered by nurse, no sign of adverse reactions noted at this time. Patient education on arm soreness and use of tylenol or ibuprofen for this patient  was discussed. Patient educated on the signs and symptoms of adverse effect and advise to contact the office if they occur.    Immunization due -     Varicella-zoster vaccine IM  Flu vaccine need -     Flu Vaccine QUAD High Dose(Fluad)  Cough in adult  IFG (impaired fasting glucose) -     Hemoglobin A1c  Vitamin D deficiency -     VITAMIN D 25 Hydroxy (Vit-D Deficiency, Fractures)  Other specified hypothyroidism -     TSH + free T4  Other hyperlipidemia -     Lipid panel -     CMP14+EGFR -     CBC with Differential/Platelet  Other orders -     Promethazine-DM; Take 5 mLs by mouth 4 (four) times daily as needed for cough.  Dispense: 118 mL; Refill: 0    Follow-up: Return in about 3 months (around 12/29/2022).   Alvira Monday, [name]

## 2022-09-30 DIAGNOSIS — B356 Tinea cruris: Secondary | ICD-10-CM | POA: Insufficient documentation

## 2022-09-30 DIAGNOSIS — Z23 Encounter for immunization: Secondary | ICD-10-CM | POA: Insufficient documentation

## 2022-09-30 NOTE — Assessment & Plan Note (Signed)
Patient educated on CDC recommendation for the vaccine. Verbal consent was obtained from the patient, vaccine administered by nurse, no sign of adverse reactions noted at this time. Patient education on arm soreness and use of tylenol or ibuprofen for this patient  was discussed. Patient educated on the signs and symptoms of adverse effect and advise to contact the office if they occur.  

## 2022-09-30 NOTE — Assessment & Plan Note (Signed)
She complains of a lingering cough after upper respiratory infection We will treat with Promethazine DM

## 2022-09-30 NOTE — Assessment & Plan Note (Signed)
She complains of an erythematous pruritic rash in the creases of the groin We will treat with topical antifungal cream Clotrimazole 1% cream order

## 2022-10-03 ENCOUNTER — Other Ambulatory Visit: Payer: Self-pay

## 2022-10-03 ENCOUNTER — Telehealth: Payer: Self-pay

## 2022-10-03 LAB — CMP14+EGFR
ALT: 15 IU/L (ref 0–32)
AST: 12 IU/L (ref 0–40)
Albumin/Globulin Ratio: 2 (ref 1.2–2.2)
Albumin: 4 g/dL (ref 3.8–4.9)
Alkaline Phosphatase: 134 IU/L — ABNORMAL HIGH (ref 44–121)
BUN/Creatinine Ratio: 18 (ref 9–23)
BUN: 21 mg/dL (ref 6–24)
Bilirubin Total: <0.2 mg/dL (ref 0.0–1.2)
CO2: 21 mmol/L (ref 20–29)
Calcium: 9.6 mg/dL (ref 8.7–10.2)
Chloride: 108 mmol/L — ABNORMAL HIGH (ref 96–106)
Creatinine, Ser: 1.18 mg/dL — ABNORMAL HIGH (ref 0.57–1.00)
Globulin, Total: 2 g/dL (ref 1.5–4.5)
Glucose: 123 mg/dL — ABNORMAL HIGH (ref 70–99)
Potassium: 4.3 mmol/L (ref 3.5–5.2)
Sodium: 143 mmol/L (ref 134–144)
Total Protein: 6 g/dL (ref 6.0–8.5)
eGFR: 54 mL/min/{1.73_m2} — ABNORMAL LOW (ref >59)

## 2022-10-03 LAB — CBC WITH DIFFERENTIAL/PLATELET
Basophils Absolute: 0.1 10*3/uL (ref 0.0–0.2)
Basos: 1 %
EOS (ABSOLUTE): 0.3 10*3/uL (ref 0.0–0.4)
Eos: 2 %
Hematocrit: 40.2 % (ref 34.0–46.6)
Hemoglobin: 13.1 g/dL (ref 11.1–15.9)
Immature Grans (Abs): 0.6 10*3/uL — ABNORMAL HIGH (ref 0.0–0.1)
Immature Granulocytes: 5 %
Lymphocytes Absolute: 5.7 10*3/uL — ABNORMAL HIGH (ref 0.7–3.1)
Lymphs: 45 %
MCH: 29.6 pg (ref 26.6–33.0)
MCHC: 32.6 g/dL (ref 31.5–35.7)
MCV: 91 fL (ref 79–97)
Monocytes Absolute: 1.1 10*3/uL — ABNORMAL HIGH (ref 0.1–0.9)
Monocytes: 8 %
Neutrophils Absolute: 4.9 10*3/uL (ref 1.4–7.0)
Neutrophils: 39 %
Platelets: 310 10*3/uL (ref 150–450)
RBC: 4.43 x10E6/uL (ref 3.77–5.28)
RDW: 12.5 % (ref 11.7–15.4)
WBC: 12.7 10*3/uL — ABNORMAL HIGH (ref 3.4–10.8)

## 2022-10-03 LAB — HEMOGLOBIN A1C
Est. average glucose Bld gHb Est-mCnc: 148 mg/dL
Hgb A1c MFr Bld: 6.8 % — ABNORMAL HIGH (ref 4.8–5.6)

## 2022-10-03 LAB — VITAMIN D 25 HYDROXY (VIT D DEFICIENCY, FRACTURES): Vit D, 25-Hydroxy: 46.4 ng/mL (ref 30.0–100.0)

## 2022-10-03 LAB — LIPID PANEL
Chol/HDL Ratio: 3 ratio (ref 0.0–4.4)
Cholesterol, Total: 172 mg/dL (ref 100–199)
HDL: 58 mg/dL (ref >39)
LDL Chol Calc (NIH): 75 mg/dL (ref 0–99)
Triglycerides: 243 mg/dL — ABNORMAL HIGH (ref 0–149)
VLDL Cholesterol Cal: 39 mg/dL (ref 5–40)

## 2022-10-03 LAB — TSH+FREE T4
Free T4: 0.78 ng/dL — ABNORMAL LOW (ref 0.82–1.77)
TSH: 4.77 u[IU]/mL — ABNORMAL HIGH (ref 0.450–4.500)

## 2022-10-06 ENCOUNTER — Other Ambulatory Visit: Payer: Self-pay | Admitting: Family Medicine

## 2022-10-06 ENCOUNTER — Telehealth: Payer: Self-pay | Admitting: Family Medicine

## 2022-10-06 DIAGNOSIS — R058 Other specified cough: Secondary | ICD-10-CM

## 2022-10-06 DIAGNOSIS — B9689 Other specified bacterial agents as the cause of diseases classified elsewhere: Secondary | ICD-10-CM

## 2022-10-06 DIAGNOSIS — E038 Other specified hypothyroidism: Secondary | ICD-10-CM

## 2022-10-06 MED ORDER — LEVOTHYROXINE SODIUM 25 MCG PO TABS: 25.0000 ug | ORAL_TABLET | Freq: Every day | ORAL | 1 refills | Status: DC

## 2022-10-06 MED ORDER — PROMETHAZINE-DM 6.25-15 MG/5ML PO SYRP: 5.0000 mL | ORAL_SOLUTION | Freq: Four times a day (QID) | ORAL | 0 refills | Status: DC | PRN

## 2022-10-06 NOTE — Telephone Encounter (Signed)
I called and reviewed labs with the patient. I started the patient on Synthroid 25 mcg to take daily. I will reassess her labs in 6 weeks. The order is placed, and the patient knows she will return in 6 weeks to assess her TSH levels.

## 2022-10-13 ENCOUNTER — Other Ambulatory Visit: Payer: Self-pay | Admitting: Family Medicine

## 2022-10-13 DIAGNOSIS — G629 Polyneuropathy, unspecified: Secondary | ICD-10-CM

## 2022-10-13 DIAGNOSIS — M792 Neuralgia and neuritis, unspecified: Secondary | ICD-10-CM

## 2022-10-17 ENCOUNTER — Other Ambulatory Visit: Payer: Self-pay | Admitting: Family Medicine

## 2022-10-17 DIAGNOSIS — M792 Neuralgia and neuritis, unspecified: Secondary | ICD-10-CM

## 2022-10-17 DIAGNOSIS — G629 Polyneuropathy, unspecified: Secondary | ICD-10-CM

## 2022-10-17 DIAGNOSIS — E559 Vitamin D deficiency, unspecified: Secondary | ICD-10-CM

## 2022-10-17 MED ORDER — METHOCARBAMOL 500 MG PO TABS: 500.0000 mg | ORAL_TABLET | Freq: Four times a day (QID) | ORAL | 0 refills | Status: DC | PRN

## 2022-10-17 MED ORDER — VITAMIN D (ERGOCALCIFEROL) 1.25 MG (50000 UNIT) PO CAPS: 50000.0000 [IU] | ORAL_CAPSULE | ORAL | 3 refills | Status: DC

## 2022-10-17 NOTE — Telephone Encounter (Signed)
Refill for vitamin D and methocarbamol is sent to the pharmacy.  Please encourage the patient to follow up with her rheumatologist regarding a refill of prednisone 5 mg.

## 2022-10-20 ENCOUNTER — Other Ambulatory Visit: Payer: Self-pay | Admitting: Family Medicine

## 2022-10-30 ENCOUNTER — Other Ambulatory Visit: Payer: Self-pay | Admitting: Family Medicine

## 2022-10-30 DIAGNOSIS — G629 Polyneuropathy, unspecified: Secondary | ICD-10-CM

## 2022-10-30 DIAGNOSIS — M792 Neuralgia and neuritis, unspecified: Secondary | ICD-10-CM

## 2022-11-01 ENCOUNTER — Other Ambulatory Visit: Payer: Self-pay

## 2022-11-01 ENCOUNTER — Telehealth: Payer: Self-pay | Admitting: Family Medicine

## 2022-11-01 DIAGNOSIS — E559 Vitamin D deficiency, unspecified: Secondary | ICD-10-CM

## 2022-11-01 DIAGNOSIS — G629 Polyneuropathy, unspecified: Secondary | ICD-10-CM

## 2022-11-01 DIAGNOSIS — M792 Neuralgia and neuritis, unspecified: Secondary | ICD-10-CM

## 2022-11-01 MED ORDER — METHOCARBAMOL 500 MG PO TABS: 500.0000 mg | ORAL_TABLET | Freq: Four times a day (QID) | ORAL | 0 refills | Status: DC | PRN

## 2022-11-01 MED ORDER — VITAMIN D (ERGOCALCIFEROL) 1.25 MG (50000 UNIT) PO CAPS: 50000.0000 [IU] | ORAL_CAPSULE | ORAL | 3 refills | Status: DC

## 2022-11-01 MED ORDER — PREDNISONE 5 MG PO TABS: 5.0000 mg | ORAL_TABLET | Freq: Every day | ORAL | 1 refills | Status: DC

## 2022-11-01 NOTE — Telephone Encounter (Signed)
Also wants to know why 3 medications were not filled?? Can nurse please give her a call?

## 2022-11-01 NOTE — Telephone Encounter (Signed)
Spoke to pt, refills have been sent. Asked for prednisone, methocarbamol and vitamin d

## 2022-11-01 NOTE — Telephone Encounter (Signed)
Patient called said meds were denied need refills  Prescription Request  11/01/2022  Is this a "Controlled Substance" medicine? No  LOV: 09/29/2022  What is the name of the medication or equipment? predniSONE (DELTASONE) 10 MG tablet     Have you contacted your pharmacy to request a refill? yes   Which pharmacy would you like this sent to?  [facility], [place] [address] [place] [address] Phone: [phone] Fax: [phone]    Patient notified that their request is being sent to the clinical staff for review and that they should receive a response within 2 business days.   Please advise at Mobile [phone] (mobile)

## 2022-11-13 IMAGING — CT CT CHEST HIGH RESOLUTION
2 of 7 series · 14 of 36 positions shown, 17 images · non-contrast
Comparison: None.

CLINICAL DATA: Persistent cough for 2 years, history of Sameiro
disease, nonsmoker



[Series 6: high resolution retro · axial · 0.68mm/px · z∈[+1457,+1677]mm · 11 of 264 slices shown, 14 images]
[im 22/264  mediastinal]
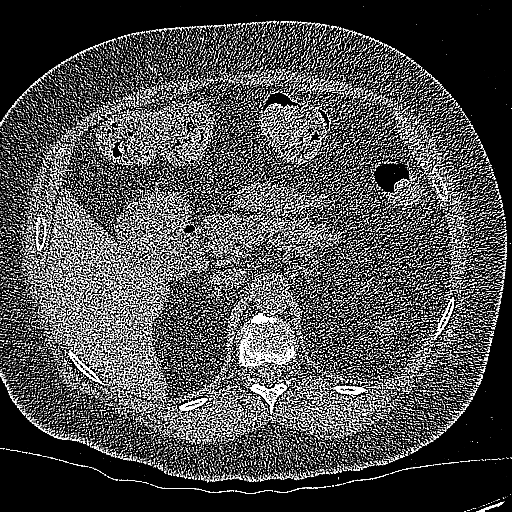
[im 22/264  lung]
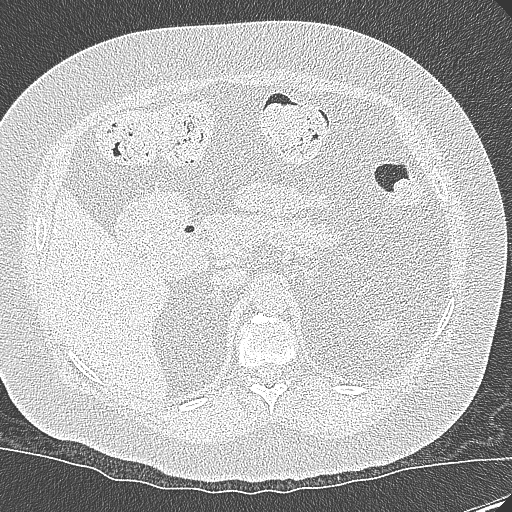
[im 44/264  lung]
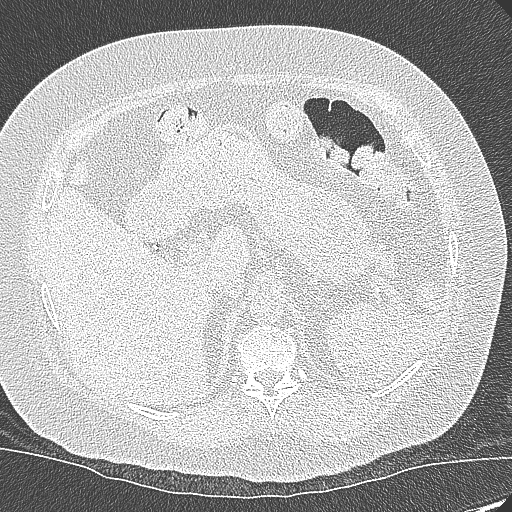
[im 66/264  lung]
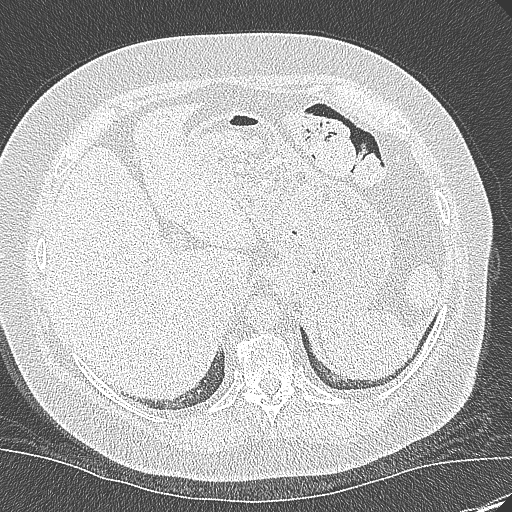
[im 88/264  lung]
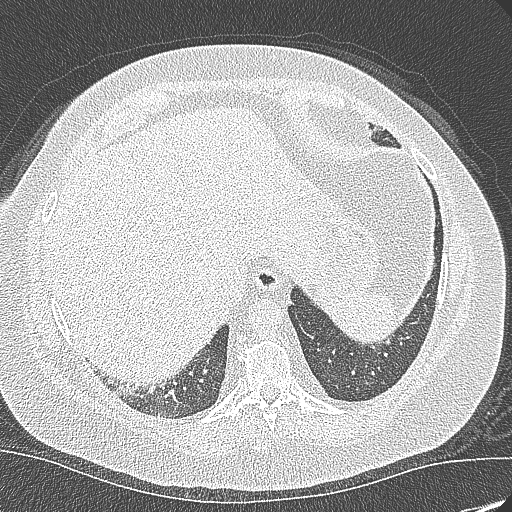
[im 110/264  mediastinal]
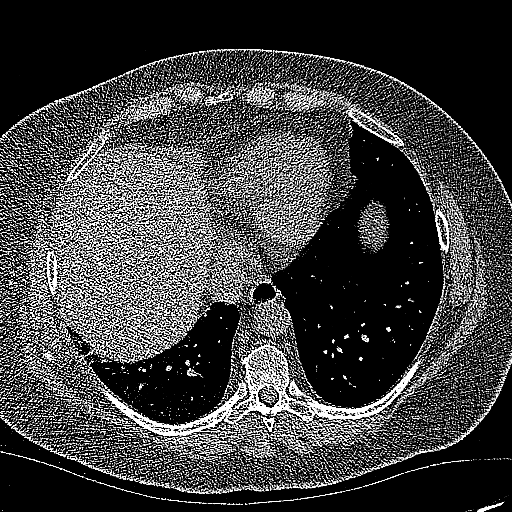
[im 110/264  lung]
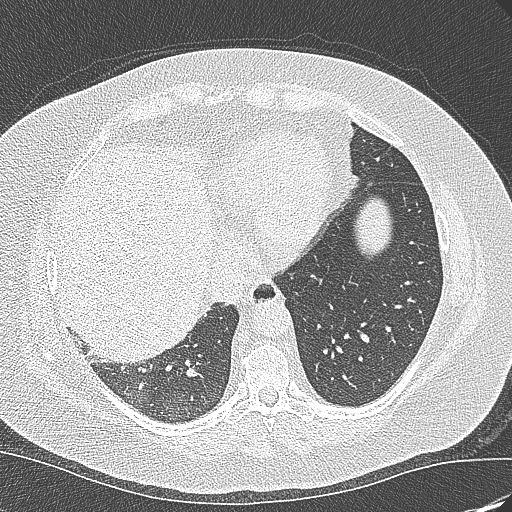
[im 132/264  lung]
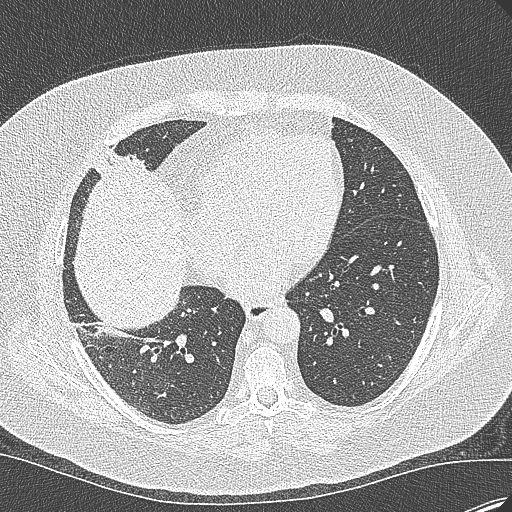
[im 154/264  lung]
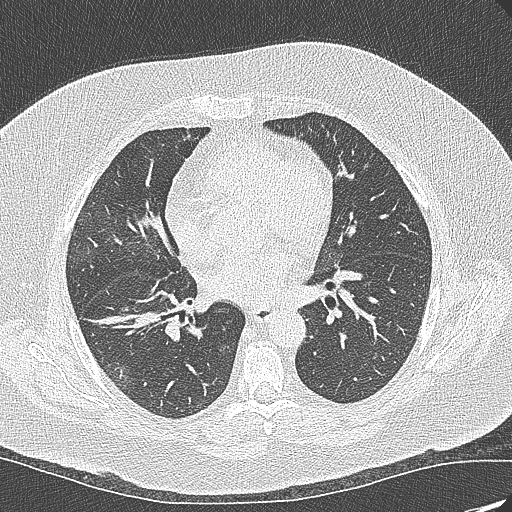
[im 176/264  lung]
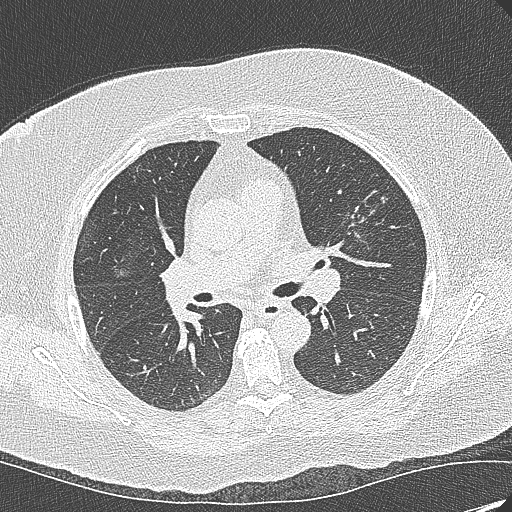
[im 198/264  mediastinal]
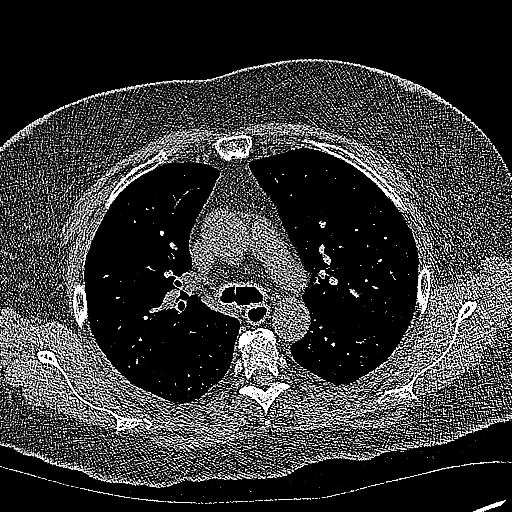
[im 198/264  lung]
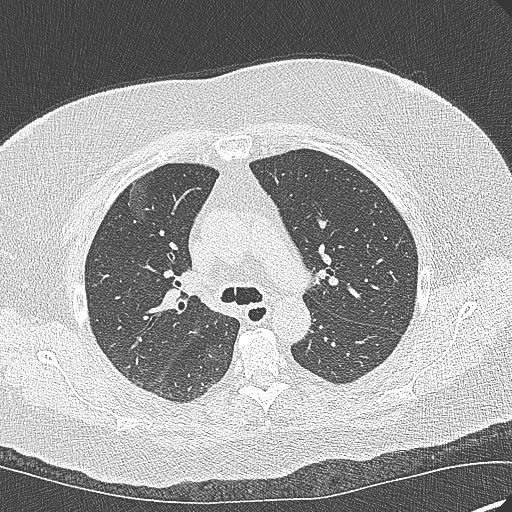
[im 220/264  lung]
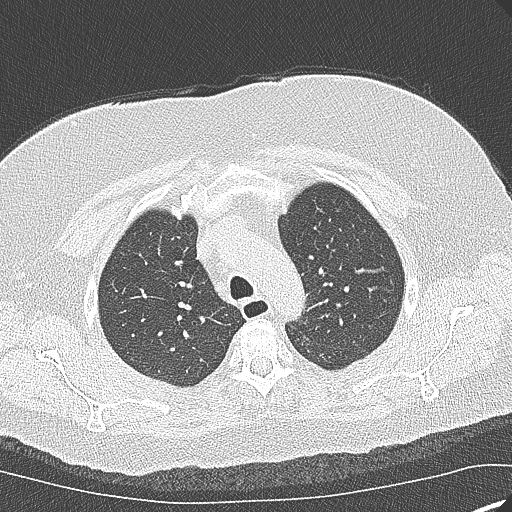
[im 242/264  lung]
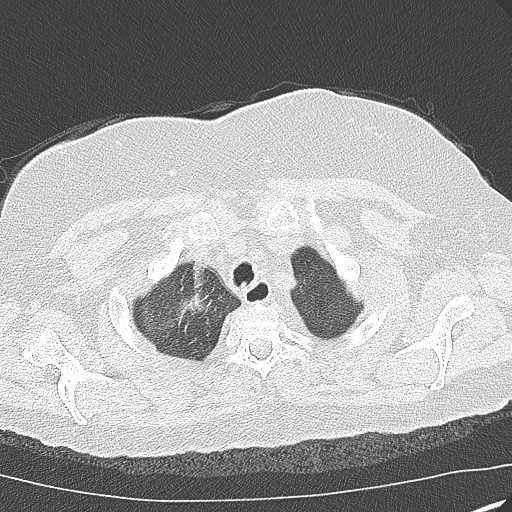

[Series 7: coronal · coronal · 0.53mm/px · 3 of 136 slices shown]
[im 28/136  lung]
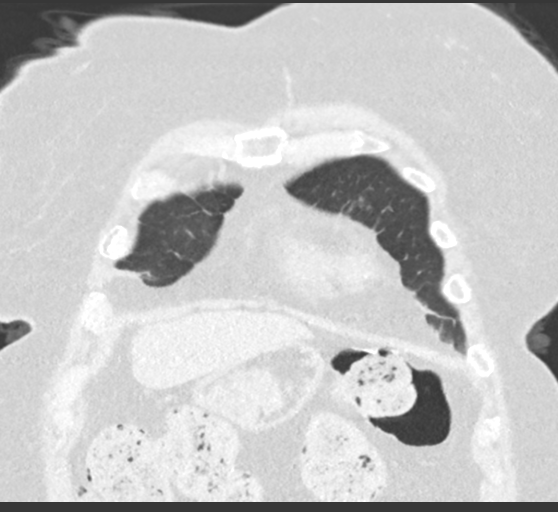
[im 55/136  lung]
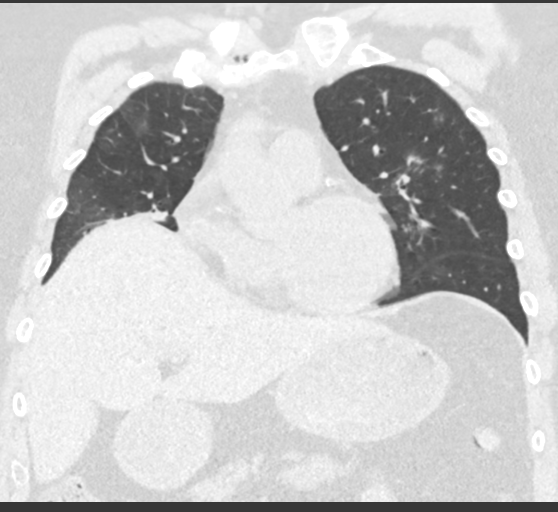
[im 82/136  lung]
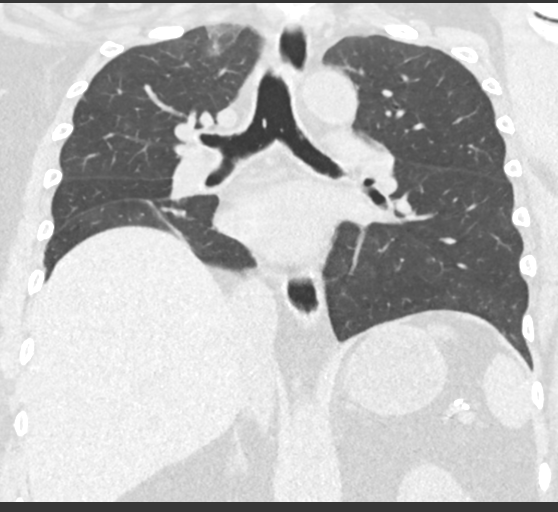

[14 of 36 positions shown; findings below may reference images not displayed]

FINDINGS: Cardiovascular: Aortic atherosclerosis. Normal heart size. Left and
right coronary artery calcifications. No pericardial effusion.

Mediastinum/Nodes: No enlarged mediastinal, hilar, or axillary lymph
nodes. Thyroid gland, trachea, and esophagus demonstrate no
significant findings.

Lungs/Pleura: Bandlike scarring and volume loss of the right middle
lobe and right lower lobe, with elevation of the right hemidiaphragm
(series 6, image 121). Irregular scarring and nodularity of the
lingula (series 6, image 105). Scattered ground-glass of the right
apex (series 6, image 24). No significant air trapping on expiratory
phase imaging. No pleural effusion or pneumothorax.

Upper Abdomen: No acute abnormality.

Musculoskeletal: No chest wall abnormality. No suspicious osseous
lesions identified.
IMPRESSION: 1. Bland appearing bandlike post infectious or inflammatory scarring
and volume loss of the right middle lobe and right lower lobe, with
elevation of the right hemidiaphragm. No evidence of fibrotic
interstitial lung disease.
2. Irregular scarring and nodularity of the lingula. Scattered
ground-glass of the right apex. These findings are nonspecific and
infectious or inflammatory.
3. Coronary artery disease.

Aortic Atherosclerosis ([id number]).

## 2022-11-20 ENCOUNTER — Other Ambulatory Visit: Payer: Self-pay | Admitting: Family Medicine

## 2022-11-21 NOTE — Progress Notes (Signed)
Cardiology Office Note   Date:  11/30/2022   ID:  [name], DOB [birth year], MRN [id number]  PCP:  Alvira Monday, [place]  Cardiologist:   Marianne Golightly Martinique, MD   No chief complaint on file.     History of Present Illness: [name] is a 58 y.o. female who presents for follow up POTs. She has a history of autonomic dysfunction/POTS/neurocardiogenic syncope, diabetes mellitus, obstructive sleep apnea unable to tolerate CPAP or BiPAP mask due to her granulomatosis, Wegener's granulomatosis, neuropathy with chronic pain, and CKD stage III.   Patient has history of hypotension with many years with symptoms of dizziness, fatigue, lethargy, and inability to focus. She has had multiple falls with injuries. Prior cardiac work-up includes a normal Echo at [facility] in 2012. She also had a Tilt Table Test in 07/2013 and has been treated for neurocardiogenic syncope with chronic Florinef. In 2015, she had a normal stress Echo but with poor functional status at [facility]. Symptoms persisted despite Florinef; therefore, patient was also placed on Midodrine. She was seen by Dr. Lovena Le in 01/2018 and it was felt like she had classic neurocardiogenic syncope symptoms with autonomic dysfunction. Continued medical and lifestyle modification was recommended. Symptoms persisted on both Florinef and Midodrine so she was seen by our Pharm D in 03/2018 and started on Northera. This was briefly held after patient developed severe leg fatigue and weakness. It was later felt that this was due to a flair of her Wegener's disease and Northera was able to be resumed.  Symptoms did improve significantly after starting Northera.    She was admitted in 12/2018 with respiratory failure which was felt to be due to granulomatosis/polyangitis. She was treated with steroids. Also started on antibiotics due to fever and extensive skin lesions. Echo during this admission showed LVEF of 65-70% with normal wall motion and diastolic  function.   Patient was seen in 03/2019 at which time patient reported she was not able to take Northera due to cost. It was going to cost her $1600/month.  Sometime prior to this visit it looks like her Midodrine was stopped.    She was admitted from 09/10/2019 to 09/15/2019 for bilateral lower toe infection and significant hypokalemia. Following discharge, patient was seen by Dr. 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 He was concerned that Florinef continues to put her at risk for hypokalemia even after lowering the dose. Therefore, Florinef was stopped.     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    She does have recurrent severe hypokalemia.   She was admitted in October 2021  for  toe ulcers and osteomyelitis. Had multiple toe amputations. In December 2021 she had left transmetatarsal amputations and right hallux amputation.    Patient has known sleep apnea but has not been able to tolerate CPAP or BiPAP masks.  She was admitted in Jan 2022 with Covid and persistent diarrhea.   In June 2022 she had recurrent osteomyelitis of her feet and had Right foot transmetatarsal amputation,  Left 5th MT ray amputation. LE arterial dopplers were normal in Dec 2022.   She was admitted in March 2023 with altered mental status and hallucinations related to change in narcotic medication change. She was also treated for UTI.  In May 2023 was admitted with right hip pain and was treated for myositis of right gluteus medius.  She was admitted in  June with RUL PNA. Also noted to have small PE on right side. Treated with IV antibiotics and place on DOAC. Echo was OK  In July 2023 she was readmitted  for shortness of breath urinary frequency and altered mental status and renal function worsen so nephrology was consulted as she required temporary  hemodialysis, nephrology eventually signed off as renal function improved and stabilized, also her mental status deteriorated neurology was consulted to perform an EEG which was unremarkable, LP was put on hold.  Had to be placed on heparin but had to be stopped as her hemoglobin dropped due to a gluteal hematoma, she was transfused now off her anticoagulation and her hemoglobin remained stable. She had an IV filter placed. This was later removed. She did Rehab for a month.   She reports she is able to walk now. She feels the hematoma is smaller but she continues to have back pain. Notes she has had a total reset on her medications with much less pain medication. She is followed now at [facility] for everything related to her Wegeners. Still with severe neuropathy in right foot. She has no symptoms of orthostasis and is no longer on any therapy for this. She is planning on getting fitted for new shoes to help with her balance. She did have recent biopsies of some skin lesions by dermatology.     Past Medical History:  Diagnosis Date   Chronic cough    Chronic pain    Diabetes mellitus without complication (HCC)    Dyspnea    GERD (gastroesophageal reflux disease)    Hypokalemia    Hypothyroidism    Left wrist pain 06/01/2021   Myonecrosis (HCC) 07/01/2021   Neurocardiogenic syncope    OSA (obstructive sleep apnea)    does not use CPAP   Osteomyelitis (HCC)    bilateral feet   Peripheral neuropathy    Presence of intrathecal pump    recieves Prialt/bupivicaine   Tear of gluteus medius tendon 07/01/2021   Wegener's granulomatosis 2009   Wegner's disease (congenital syphilitic osteochondritis)     Past Surgical History:  Procedure Laterality Date   AMPUTATION Right 10/21/2020   Procedure: Right hallux amputation;  Surgeon: Toni Arthurs, MD;  Location: [facility];  Service: Orthopedics;  Laterality: Right;   AMPUTATION TOE Bilateral 08/12/2020   Procedure: Right 3rd toe amputation;  left hallux and 3rd toe amputation;  Surgeon: Toni Arthurs, MD;  Location: [facility];  Service: Orthopedics;  Laterality: Bilateral;    AMPUTATION TOE Right 03/24/2021   Procedure: AMPUTATION TOE;  Surgeon: Toni Arthurs, MD;  Location: [facility];  Service: Orthopedics;  Laterality: Right;   APPENDECTOMY     BACK SURGERY     BIOPSY  03/30/2022   Procedure: BIOPSY;  Surgeon: Tomma Lightning, MD;  Location: [facility];  Service: Pulmonary;;   BRONCHIAL WASHINGS  03/30/2022   Procedure: BRONCHIAL WASHINGS;  Surgeon: Tomma Lightning, MD;  Location: [facility];  Service: Pulmonary;;   CHOLECYSTECTOMY     COLONOSCOPY N/A 06/02/2013   Procedure: COLONOSCOPY;  Surgeon: Graylin Shiver, MD;  Location: [facility];  Service: Endoscopy;  Laterality: N/A;   HERNIA REPAIR  2000   INCISION AND DRAINAGE ABSCESS Left 07/07/2016   Procedure: DEBRIDMENT LEFT THIGH ABSCESS, EXISION ACUTE SKIN RASH LEFT THIGH(1CM LESION);  Surgeon: Claud Kelp, MD;  Location: [facility];  Service: General;  Laterality: Left;   IR FLUORO GUIDE CV LINE RIGHT  04/24/2022   IR  IVC FILTER PLMT / S&I /IMG GUID/MOD SED  05/17/2022   IR US GUIDE VASC ACCESS RIGHT  04/24/2022   SEPTOPLASTY  02/2013   spleen anuyism     splenic aneurysm     TRANSMETATARSAL AMPUTATION Bilateral 10/21/2020   Procedure: Left transmetatarsal amputation;  Surgeon: Wylene Simmer, MD;  Location: [facility];  Service: Orthopedics;  Laterality: Bilateral;   TRANSMETATARSAL AMPUTATION Left 03/24/2021   Procedure: TRANSMETATARSAL AMPUTATION;  Surgeon: Wylene Simmer, MD;  Location: [place];  Service: Orthopedics;  Laterality: Left;   VAGINAL HYSTERECTOMY     VIDEO BRONCHOSCOPY Right 03/30/2022   Procedure: VIDEO BRONCHOSCOPY WITH FLUORO;  Surgeon: Laurin Coder, MD;  Location: [place] [facility];  Service: Pulmonary;  Laterality: Right;  atypical pneumonia     Current Outpatient Medications  Medication Sig Dispense Refill   acetaminophen  (TYLENOL) 325 MG tablet Take 2 tablets (650 mg total) by mouth every 6 (six) hours as needed for mild pain (or Fever >/= 101).     albuterol (VENTOLIN HFA) 108 (90 Base) MCG/ACT inhaler Inhale 2 puffs into the lungs every 6 (six) hours as needed for wheezing or shortness of breath. 1 each 11   amitriptyline (ELAVIL) 50 MG tablet TAKE 1 TABLET BY MOUTH AT BEDTIME FOR SLEEP. 30 tablet 0   Calcium Carb-Cholecalciferol (CALCIUM CARBONATE-VITAMIN D3 PO) Take 1 tablet by mouth daily.     clotrimazole (ANTIFUNGAL CLOTRIMAZOLE) 1 % cream Apply 1 Application topically 2 (two) times daily. 30 g 0   DULoxetine (CYMBALTA) 60 MG capsule Take 60 mg by mouth 2 (two) times daily.     gabapentin (NEURONTIN) 300 MG capsule Take 2 capsules (600 mg total) by mouth 3 (three) times daily. 180 capsule 0   guaiFENesin (MUCINEX) 600 MG 12 hr tablet Take 1 tablet (600 mg total) by mouth 2 (two) times daily. 60 tablet 3   levothyroxine (SYNTHROID) 25 MCG tablet Take 1 tablet (25 mcg total) by mouth daily. 30 tablet 1   lidocaine (LIDODERM) 5 % Place 1 patch onto the skin daily as needed (for pain). Remove & Discard patch within 12 hours or as directed by MD     LINZESS 145 MCG CAPS capsule Take one capsule (145 mcg dose) by mouth daily. 30 capsule 0   magnesium oxide (MAG-OX) 400 MG tablet Take 400 mg by mouth daily.     Melatonin 10 MG TABS Take 10 mg by mouth at bedtime as needed (sleep).     methocarbamol (ROBAXIN) 500 MG tablet Take 1 tablet (500 mg total) by mouth every 6 (six) hours as needed for muscle spasms. 30 tablet 0   naloxone (NARCAN) nasal spray 4 mg/0.1 mL Place 0.4 mg into the nose once.     pantoprazole (PROTONIX) 40 MG tablet TAKE (1) TABLET BY MOUTH ONCE DAILY. 30 tablet 0   predniSONE (DELTASONE) 5 MG tablet Take 1 tablet (5 mg total) by mouth daily. 30 tablet 1   rosuvastatin (CRESTOR) 10 MG tablet TAKE (1) TABLET BY MOUTH AT BEDTIME. 30 tablet 0   senna (SENOKOT) 8.6 MG TABS tablet Take 2 tablets (17.2  mg total) by mouth 2 (two) times daily. 30 tablet 0   topiramate (TOPAMAX) 100 MG tablet TAKE (1) TABLET BY MOUTH ONCE DAILY. 30 tablet 0   Vitamin D, Ergocalciferol, (DRISDOL) 1.25 MG (50000 UNIT) CAPS capsule Take 1 capsule (50,000 Units total) by mouth every Friday. 7 capsule 3   HYDROcodone-acetaminophen (NORCO/VICODIN) 5-325 MG tablet Take 1 tablet by  mouth 2 (two) times daily as needed.     metFORMIN (GLUCOPHAGE-XR) 500 MG 24 hr tablet Take 1 tablet (500 mg total) by mouth daily with breakfast. 30 tablet 0   promethazine-dextromethorphan (PROMETHAZINE-DM) 6.25-15 MG/5ML syrup Take 5 mLs by mouth 4 (four) times daily as needed for cough. 118 mL 0   triamcinolone cream (KENALOG) 0.1 % Apply 1 Application topically 2 (two) times daily. 30 g 0   No current facility-administered medications for this visit.    Allergies:   Ibuprofen and Penicillins    Social History:  The patient  reports that she has never smoked. She has never used smokeless tobacco. She reports current alcohol use. She reports that she does not use drugs.   Family History:  The patient's family history includes Diabetes in her father and mother; Heart disease in her mother.    ROS:  Please see the history of present illness.   Otherwise, review of systems are positive for pain in great toe. Some intermittent leg swelling.   All other systems are reviewed and negative.    PHYSICAL EXAM: VS:  BP 104/68   Pulse 67   Ht 5\' 4"  (1.626 m)   Wt 224 lb (101.6 kg)   SpO2 95%   BMI 38.45 kg/m  , BMI Body mass index is 38.45 kg/m. GEN: Well nourished, obese, in no acute distress  HEENT: nasal deformation Neck: no JVD, carotid bruits, or masses Cardiac: RRR; soft 1/6 systolic murmur at the apex, rubs, or gallops,no edema  Respiratory:  clear to auscultation bilaterally, normal work of breathing GI: soft, nontender, nondistended, + BS MS: no deformity or atrophy  Skin: warm and dry, no rash Neuro:  Strength and sensation  are intact Psych: euthymic mood, full affect   EKG:  EKG is not ordered today.     Recent Labs: 04/02/2022: B Natriuretic Peptide 50.8 05/17/2022: Magnesium 1.3 10/02/2022: ALT 15; BUN 21; Creatinine, Ser 1.18; Hemoglobin 13.1; Platelets 310; Potassium 4.3; Sodium 143; TSH 4.770    Lipid Panel    Component Value Date/Time   CHOL 172 10/02/2022 0955   TRIG 243 (H) 10/02/2022 0955   HDL 58 10/02/2022 0955   CHOLHDL 3.0 10/02/2022 0955   LDLCALC 75 10/02/2022 0955      Wt Readings from Last 3 Encounters:  11/30/22 224 lb (101.6 kg)  09/29/22 212 lb 0.6 oz (96.2 kg)  06/27/22 188 lb (85.3 kg)      Other studies Reviewed: Additional studies/ records that were reviewed today include: none   ASSESSMENT AND PLAN:  1.  Orthostatic hypotension/POTS. Clinically doing very well. Off Florinef and midodrine. No symptoms.   2. Wegener's granulomatosis on chronic steroids- followed at [facility]  3. OSA intolerant to CPAP mask.   4. Obesity.   5. Osteomyelitis of toes s/p multiple ampuations  6. HLD now on Crestor. Labs ok.    Current medicines are reviewed at length with the patient today.  The patient does not have concerns regarding medicines.  The following changes have been made:  no change  Labs/ tests ordered today include:  No orders of the defined types were placed in this encounter.    Disposition:   FU with me in 1 year  Signed, Canio Winokur Martinique, MD  11/30/2022 9:35 AM    [facility] [address], [place], [place], [address] Phone [phone], Fax [phone]

## 2022-11-28 ENCOUNTER — Other Ambulatory Visit: Payer: Self-pay | Admitting: Family Medicine

## 2022-11-28 DIAGNOSIS — G629 Polyneuropathy, unspecified: Secondary | ICD-10-CM

## 2022-11-28 DIAGNOSIS — M792 Neuralgia and neuritis, unspecified: Secondary | ICD-10-CM

## 2022-11-30 ENCOUNTER — Ambulatory Visit: Payer: BC Managed Care – PPO | Attending: Cardiology | Admitting: Cardiology

## 2022-11-30 ENCOUNTER — Encounter: Payer: Self-pay | Admitting: Cardiology

## 2022-11-30 VITALS — BP 104/68 | HR 67 | Ht 64.0 in | Wt 224.0 lb

## 2022-11-30 DIAGNOSIS — G90A Postural orthostatic tachycardia syndrome (POTS): Secondary | ICD-10-CM

## 2022-11-30 DIAGNOSIS — E782 Mixed hyperlipidemia: Secondary | ICD-10-CM

## 2022-11-30 MED ORDER — METFORMIN HCL ER 500 MG PO TB24: 500.0000 mg | ORAL_TABLET | Freq: Every day | ORAL | 0 refills | Status: DC

## 2022-11-30 NOTE — Patient Instructions (Signed)
Medication Instructions:   Your physician recommends that you continue on your current medications as directed. Please refer to the Current Medication list given to you today.   *If you need a refill on your cardiac medications before your next appointment, please call your pharmacy*   Lab Work: NONE ORDERED  TODAY   If you have labs (blood work) drawn today and your tests are completely normal, you will receive your results only by: MyChart Message (if you have MyChart) OR A paper copy in the mail If you have any lab test that is abnormal or we need to change your treatment, we will call you to review the results.   Testing/Procedures: NONE ORDERED  TODAY    Follow-Up: At [facility], you and your health needs are our priority.  As part of our continuing mission to provide you with exceptional heart care, we have created designated Provider Care Teams.  These Care Teams include your primary Cardiologist (physician) and Advanced Practice Providers (APPs -  Physician Assistants and Nurse Practitioners) who all work together to provide you with the care you need, when you need it.  We recommend signing up for the patient portal called "MyChart".  Sign up information is provided on this After Visit Summary.  MyChart is used to connect with patients for Virtual Visits (Telemedicine).  Patients are able to view lab/test results, encounter notes, upcoming appointments, etc.  Non-urgent messages can be sent to your provider as well.   To learn more about what you can do with MyChart, go to [email or web address].    Your next appointment:   1 year(s)  Provider:   Peter Jordan, MD     Other Instructions  

## 2022-12-01 ENCOUNTER — Other Ambulatory Visit: Payer: Self-pay | Admitting: Family Medicine

## 2022-12-01 DIAGNOSIS — G629 Polyneuropathy, unspecified: Secondary | ICD-10-CM

## 2022-12-01 DIAGNOSIS — M792 Neuralgia and neuritis, unspecified: Secondary | ICD-10-CM

## 2022-12-04 ENCOUNTER — Other Ambulatory Visit: Payer: Self-pay | Admitting: Family Medicine

## 2022-12-04 DIAGNOSIS — G629 Polyneuropathy, unspecified: Secondary | ICD-10-CM

## 2022-12-04 DIAGNOSIS — M792 Neuralgia and neuritis, unspecified: Secondary | ICD-10-CM

## 2022-12-04 MED ORDER — METHOCARBAMOL 500 MG PO TABS: 500.0000 mg | ORAL_TABLET | Freq: Four times a day (QID) | ORAL | 0 refills | Status: DC | PRN

## 2022-12-04 NOTE — Telephone Encounter (Signed)
Rx sent 

## 2022-12-08 ENCOUNTER — Other Ambulatory Visit: Payer: Self-pay | Admitting: Family Medicine

## 2022-12-08 ENCOUNTER — Other Ambulatory Visit: Payer: Self-pay | Admitting: Internal Medicine

## 2022-12-13 ENCOUNTER — Encounter: Payer: Self-pay | Admitting: Family Medicine

## 2022-12-13 ENCOUNTER — Ambulatory Visit: Payer: BC Managed Care – PPO | Admitting: Family Medicine

## 2022-12-13 ENCOUNTER — Telehealth: Payer: Self-pay | Admitting: Family Medicine

## 2022-12-13 VITALS — BP 110/78 | HR 94 | Ht 64.0 in | Wt 225.0 lb

## 2022-12-13 DIAGNOSIS — L98499 Non-pressure chronic ulcer of skin of other sites with unspecified severity: Secondary | ICD-10-CM | POA: Insufficient documentation

## 2022-12-13 DIAGNOSIS — S91301A Unspecified open wound, right foot, initial encounter: Secondary | ICD-10-CM | POA: Diagnosis not present

## 2022-12-13 DIAGNOSIS — E11621 Type 2 diabetes mellitus with foot ulcer: Secondary | ICD-10-CM | POA: Diagnosis not present

## 2022-12-13 DIAGNOSIS — L98492 Non-pressure chronic ulcer of skin of other sites with fat layer exposed: Secondary | ICD-10-CM | POA: Insufficient documentation

## 2022-12-13 DIAGNOSIS — L97512 Non-pressure chronic ulcer of other part of right foot with fat layer exposed: Secondary | ICD-10-CM | POA: Diagnosis not present

## 2022-12-13 MED ORDER — DOXYCYCLINE HYCLATE 100 MG PO TABS: 100.0000 mg | ORAL_TABLET | Freq: Two times a day (BID) | ORAL | 0 refills | Status: AC

## 2022-12-13 NOTE — Progress Notes (Deleted)
   Acute Office Visit  Subjective:    Patient ID: [name], female    DOB: [birth year], 57 y.o.   MRN: [id number]  Chief Complaint  Patient presents with   Wound Check    Patient complains of a wound on her R foot for several weeks that has not healed.     Patient is in today for *** Wound Check: Patient presents for wound check. Patient has a {wound type:14237} wound which is located on the {brief location:14270} and {detailed location:14271}. Current symptoms: {wound sx:14238}. Symptoms began 1 month ago. Pain is rated 7/10. Interventions to date: {interventions:14239}.  Right tip of amputation   Throbbing , aching , pain  OTC neosprin, vaselnie, soap, water, peroxide   Wound Check    ROS    Objective:    BP 110/78   Pulse 94   Ht 5' 4"$  (1.626 m)   Wt 225 lb (102.1 kg)   SpO2 94%   BMI 38.62 kg/m  {Vitals History (Optional):23777}  Physical Exam  No results found for any visits on 12/13/22.     Assessment & Plan:  There are no diagnoses linked to this encounter.  No follow-ups on file.  Renard Hamper Ria Comment, [name]

## 2022-12-13 NOTE — Addendum Note (Signed)
Addended by: Bolivar Haw on: 12/13/2022 04:26 PM   Modules accepted: Orders

## 2022-12-13 NOTE — Telephone Encounter (Signed)
Patient called asked can her referral be changed from wound center in [place] to the [place] area.  Patient call back # [phone].

## 2022-12-13 NOTE — Assessment & Plan Note (Signed)
Bilateral transmetatarsal amputation Right foot ulcer, warm to touch, erythematous, slight yellow drainage Prescribed doxycyline 100 mg Referral to wound care clinic

## 2022-12-13 NOTE — Telephone Encounter (Signed)
Hello [name] are you able to put in the referral to [place] wound [facility] center

## 2022-12-13 NOTE — Patient Instructions (Signed)
It was pleasure meeting with you today. Please take medications as prescribed. Follow up with your primary health provider if any health concerns arises.

## 2022-12-13 NOTE — Progress Notes (Addendum)
Patient Office Visit   Subjective   Patient ID: [name], female    DOB: 1966-03-30  Age: 57 y.o. MRN: [id number]  CC:  Chief Complaint  Patient presents with   Wound Check    Patient complains of a wound on her R foot for several weeks that has not healed.     HPI [name] 57 year old female, presents to clinic for right foot diabetic wound started about 3 weeks ago. She  has a past medical history of Chronic cough, Chronic pain, Diabetes mellitus without complication ([place]), Dyspnea, GERD (gastroesophageal reflux disease), Hypokalemia, Hypothyroidism, Left wrist pain (06/01/2021), Myonecrosis ([place]) (07/01/2021), Neurocardiogenic syncope, OSA (obstructive sleep apnea), Osteomyelitis ([place]), Peripheral neuropathy, Presence of intrathecal pump, Tear of gluteus medius tendon (07/01/2021), Wegener's granulomatosis (2009), and Wegner's disease (congenital syphilitic osteochondritis).  Wound Check Previous treatment included wound cleansing or irrigation. There has been colored discharge from the wound. There is new redness present. The swelling has not changed. There is new pain present. She has no difficulty moving the affected extremity or digit.    Outpatient Encounter Medications as of 12/13/2022  Medication Sig   acetaminophen (TYLENOL) 325 MG tablet Take 2 tablets (650 mg total) by mouth every 6 (six) hours as needed for mild pain (or Fever >/= 101).   albuterol (VENTOLIN HFA) 108 (90 Base) MCG/ACT inhaler Inhale 2 puffs into the lungs every 6 (six) hours as needed for wheezing or shortness of breath.   amitriptyline (ELAVIL) 50 MG tablet TAKE 1 TABLET BY MOUTH AT BEDTIME FOR SLEEP.   Calcium Carb-Cholecalciferol (CALCIUM CARBONATE-VITAMIN D3 PO) Take 1 tablet by mouth daily.   clotrimazole (ANTIFUNGAL CLOTRIMAZOLE) 1 % cream Apply 1 Application topically 2 (two) times daily.   doxycycline (VIBRA-TABS) 100 MG tablet Take 1 tablet (100 mg total) by mouth 2 (two) times daily for 7 days.    DULoxetine (CYMBALTA) 60 MG capsule Take 60 mg by mouth 2 (two) times daily.   gabapentin (NEURONTIN) 300 MG capsule Take 2 capsules (600 mg total) by mouth 3 (three) times daily.   guaiFENesin (MUCINEX) 600 MG 12 hr tablet Take 1 tablet (600 mg total) by mouth 2 (two) times daily.   HYDROcodone-acetaminophen (NORCO/VICODIN) 5-325 MG tablet Take 1 tablet by mouth 2 (two) times daily as needed.   levothyroxine (SYNTHROID) 25 MCG tablet Take 1 tablet (25 mcg total) by mouth daily.   lidocaine (LIDODERM) 5 % Place 1 patch onto the skin daily as needed (for pain). Remove & Discard patch within 12 hours or as directed by MD   LINZESS 145 MCG CAPS capsule Take one capsule (145 mcg dose) by mouth daily.   magnesium oxide (MAG-OX) 400 MG tablet Take 400 mg by mouth daily.   Melatonin 10 MG TABS Take 10 mg by mouth at bedtime as needed (sleep).   metFORMIN (GLUCOPHAGE-XR) 500 MG 24 hr tablet Take 1 tablet (500 mg total) by mouth daily with breakfast.   methocarbamol (ROBAXIN) 500 MG tablet Take 1 tablet (500 mg total) by mouth every 6 (six) hours as needed for muscle spasms.   MIEBO 1.338 GM/ML SOLN Apply to eye.   naloxone (NARCAN) nasal spray 4 mg/0.1 mL Place 0.4 mg into the nose once.   pantoprazole (PROTONIX) 40 MG tablet TAKE (1) TABLET BY MOUTH ONCE DAILY.   predniSONE (DELTASONE) 5 MG tablet Take 1 tablet (5 mg total) by mouth daily.   promethazine-dextromethorphan (PROMETHAZINE-DM) 6.25-15 MG/5ML syrup Take 5 mLs by mouth 4 (four) times  daily as needed for cough.   rosuvastatin (CRESTOR) 10 MG tablet TAKE (1) TABLET BY MOUTH AT BEDTIME.   senna (SENOKOT) 8.6 MG TABS tablet Take 2 tablets (17.2 mg total) by mouth 2 (two) times daily.   topiramate (TOPAMAX) 100 MG tablet TAKE (1) TABLET BY MOUTH ONCE DAILY.   triamcinolone cream (KENALOG) 0.1 % Apply 1 Application topically 2 (two) times daily.   Vitamin D, Ergocalciferol, (DRISDOL) 1.25 MG (50000 UNIT) CAPS capsule Take 1 capsule (50,000 Units  total) by mouth every Friday.   No facility-administered encounter medications on file as of 12/13/2022.    Past Surgical History:  Procedure Laterality Date   AMPUTATION Right 10/21/2020   Procedure: Right hallux amputation;  Surgeon: Wylene Simmer, MD;  Location: [facility];  Service: Orthopedics;  Laterality: Right;   AMPUTATION TOE Bilateral 08/12/2020   Procedure: Right 3rd toe amputation; left hallux and 3rd toe amputation;  Surgeon: Wylene Simmer, MD;  Location: [facility];  Service: Orthopedics;  Laterality: Bilateral;  15mn   AMPUTATION TOE Right 03/24/2021   Procedure: AMPUTATION TOE;  Surgeon: HWylene Simmer MD;  [place] [facility]  Service: Orthopedics;  Laterality: Right;   APPENDECTOMY     BACK SURGERY     BIOPSY  03/30/2022   Procedure: BIOPSY;  Surgeon: OLaurin Coder MD;  [place] [facility]  Service: Pulmonary;;   BRONCHIAL WASHINGS  03/30/2022   Procedure: BRONCHIAL WASHINGS;  Surgeon: OLaurin Coder MD;  [place] [facility]  Service: Pulmonary;;   CHOLECYSTECTOMY     COLONOSCOPY N/A 06/02/2013   Procedure: COLONOSCOPY;  Surgeon: SWonda Horner MD;  [facility]  Service: Endoscopy;  Laterality: N/A;   HERNIA REPAIR  2000   INCISION AND DRAINAGE ABSCESS Left 07/07/2016   Procedure: DEBRIDMENT LEFT THIGH ABSCESS, EXISION ACUTE SKIN RASH LEFT THIGH(1CM LESION);  Surgeon: HFanny Skates MD;  [facility];  Service: General;  Laterality: Left;   IR FLUORO GUIDE CV LINE RIGHT  04/24/2022   IR IVC FILTER PLMT / S&I /IMG GUID/MOD SED  05/17/2022   IR [place] VASC ACCESS RIGHT  04/24/2022   SEPTOPLASTY  02/2013   spleen anuyism     splenic aneurysm     TRANSMETATARSAL AMPUTATION Bilateral 10/21/2020   Procedure: Left transmetatarsal amputation;  Surgeon: HWylene Simmer MD;  [place] [facility]  Service: Orthopedics;  Laterality: Bilateral;   TRANSMETATARSAL AMPUTATION Left 03/24/2021   Procedure: TRANSMETATARSAL  AMPUTATION;  Surgeon: HWylene Simmer MD;  [place] [facility]  Service: Orthopedics;  Laterality: Left;   VAGINAL HYSTERECTOMY     VIDEO BRONCHOSCOPY Right 03/30/2022   Procedure: VIDEO BRONCHOSCOPY WITH FLUORO;  Surgeon: OLaurin Coder MD;  [facility];  Service: Pulmonary;  Laterality: Right;  atypical pneumonia    Review of Systems  Constitutional:  Negative for chills and fever.  Eyes:  Negative for blurred vision.  Respiratory:  Negative for shortness of breath.   Cardiovascular:  Negative for chest pain.  Gastrointestinal:  Negative for abdominal pain, nausea and vomiting.  Musculoskeletal:  Negative for myalgias.  Skin:  Negative for itching and rash.  Neurological:  Negative for dizziness and headaches.  Psychiatric/Behavioral:  The patient is not nervous/anxious.       Objective    BP 110/78   Pulse 94   Ht 5' 4"$  (1.626 m)   Wt 225 lb (102.1 kg)   SpO2 94%   BMI 38.62 kg/m   Physical Exam Constitutional:  Appearance: She is obese.  Cardiovascular:     Rate and Rhythm: Normal rate and regular rhythm.     Pulses: Normal pulses.     Heart sounds: Normal heart sounds.  Pulmonary:     Effort: Pulmonary effort is normal.     Breath sounds: Normal breath sounds.  Musculoskeletal:     Right foot: Normal range of motion. Swelling and tenderness present.     Left foot: Normal range of motion. No swelling or tenderness.     Comments: Bilateral transmetatarsal amputations   Skin:    Capillary Refill: Capillary refill takes less than 2 seconds.     Findings: Erythema present.  Neurological:     Mental Status: She is alert. Mental status is at baseline.  Psychiatric:        Mood and Affect: Mood normal.       Assessment & Plan:  Diabetic ulcer of toe of right foot associated with type 2 diabetes mellitus, with fat layer exposed ([place]) Assessment & Plan: Bilateral transmetatarsal amputation Right foot ulcer, warm to touch, erythematous, slight yellow  drainage Prescribed doxycyline 100 mg Referral to wound care clinic   Orders: -     Doxycycline Hyclate; Take 1 tablet (100 mg total) by mouth 2 (two) times daily for 7 days.  Dispense: 14 tablet; Refill: 0  Unspecified open wound, right foot, initial encounter -     Ambulatory referral to Wound Clinic    No follow-ups on file.   Renard Hamper Ria Comment, [name]

## 2022-12-13 NOTE — Telephone Encounter (Signed)
Can you please update referral it was put it today by [place]?

## 2022-12-14 ENCOUNTER — Other Ambulatory Visit: Payer: Self-pay | Admitting: Internal Medicine

## 2022-12-15 ENCOUNTER — Other Ambulatory Visit: Payer: Self-pay | Admitting: Family Medicine

## 2022-12-15 ENCOUNTER — Other Ambulatory Visit: Payer: Self-pay

## 2022-12-15 DIAGNOSIS — E038 Other specified hypothyroidism: Secondary | ICD-10-CM

## 2022-12-15 NOTE — Telephone Encounter (Signed)
Left detailed vm explaining closest wound clinic is [place], advised for her to call the back of her insurance card and ask if there is another place closer to her and we will be happy to updated the refferal.

## 2022-12-15 NOTE — Telephone Encounter (Signed)
Patient called back in regard to referral change.  Wants to see if can  get switched to [place] office.  Wants a call back in regard.

## 2023-01-01 ENCOUNTER — Other Ambulatory Visit: Payer: Self-pay | Admitting: Family Medicine

## 2023-01-01 DIAGNOSIS — M792 Neuralgia and neuritis, unspecified: Secondary | ICD-10-CM

## 2023-01-01 DIAGNOSIS — G629 Polyneuropathy, unspecified: Secondary | ICD-10-CM

## 2023-01-02 ENCOUNTER — Encounter: Payer: Self-pay | Admitting: Family Medicine

## 2023-01-02 ENCOUNTER — Ambulatory Visit: Payer: BC Managed Care – PPO | Admitting: Family Medicine

## 2023-01-02 VITALS — BP 116/68 | HR 78 | Ht 64.0 in | Wt 229.1 lb

## 2023-01-02 DIAGNOSIS — G479 Sleep disorder, unspecified: Secondary | ICD-10-CM

## 2023-01-02 DIAGNOSIS — E7849 Other hyperlipidemia: Secondary | ICD-10-CM | POA: Diagnosis not present

## 2023-01-02 DIAGNOSIS — L98492 Non-pressure chronic ulcer of skin of other sites with fat layer exposed: Secondary | ICD-10-CM | POA: Diagnosis not present

## 2023-01-02 DIAGNOSIS — E1100 Type 2 diabetes mellitus with hyperosmolarity without nonketotic hyperglycemic-hyperosmolar coma (NKHHC): Secondary | ICD-10-CM

## 2023-01-02 DIAGNOSIS — E669 Obesity, unspecified: Secondary | ICD-10-CM | POA: Diagnosis not present

## 2023-01-02 DIAGNOSIS — E559 Vitamin D deficiency, unspecified: Secondary | ICD-10-CM

## 2023-01-02 DIAGNOSIS — I10 Essential (primary) hypertension: Secondary | ICD-10-CM

## 2023-01-02 DIAGNOSIS — G609 Hereditary and idiopathic neuropathy, unspecified: Secondary | ICD-10-CM

## 2023-01-02 DIAGNOSIS — E038 Other specified hypothyroidism: Secondary | ICD-10-CM

## 2023-01-02 MED ORDER — ROSUVASTATIN CALCIUM 10 MG PO TABS: ORAL_TABLET | ORAL | 2 refills | Status: DC

## 2023-01-02 MED ORDER — AMITRIPTYLINE HCL 50 MG PO TABS: ORAL_TABLET | ORAL | 0 refills | Status: DC

## 2023-01-02 NOTE — Progress Notes (Signed)
-year-old female  Established Patient Office Visit  Subjective:  Patient ID: [name], female    DOB: 11-29-1965  Age: 57 y.o. MRN: [id number]  CC:  Chief Complaint  Patient presents with   Follow-up    3 month f/u, pt reports weight gain and trouble sleeping also really bad low back pain still present. Has a rash under her belly.     HPI [name] is a 57 y.o. female with past medical history of GERD, diabetic foot ulcer, and hypothyroidism presents for f/u of  chronic medical conditions. For the details of today's visit, please refer to the assessment and plan.      Past Medical History:  Diagnosis Date   Chronic cough    Chronic pain    Diabetes mellitus without complication (HCC)    Dyspnea    GERD (gastroesophageal reflux disease)    Hypokalemia    Hypothyroidism    Left wrist pain 06/01/2021   Myonecrosis ([name]) 07/01/2021   Neurocardiogenic syncope    OSA (obstructive sleep apnea)    does not use CPAP   Osteomyelitis (HCC)    bilateral feet   Peripheral neuropathy    Presence of intrathecal pump    recieves Prialt/bupivicaine   Tear of gluteus medius tendon 07/01/2021   Wegener's granulomatosis 2009   Wegner's disease (congenital syphilitic osteochondritis)     Past Surgical History:  Procedure Laterality Date   AMPUTATION Right 10/21/2020   Procedure: Right hallux amputation;  Surgeon: Wylene Simmer, MD;  Location: [facility];  Service: Orthopedics;  Laterality: Right;   AMPUTATION TOE Bilateral 08/12/2020   Procedure: Right 3rd toe amputation; left hallux and 3rd toe amputation;  Surgeon: Wylene Simmer, MD;  Location: [facility];  Service: Orthopedics;  Laterality: Bilateral;  70mn   AMPUTATION TOE Right 03/24/2021   Procedure: AMPUTATION TOE;  Surgeon: HWylene Simmer MD;  [place] [facility]  Service: Orthopedics;  Laterality: Right;   APPENDECTOMY     BACK SURGERY     BIOPSY  03/30/2022   Procedure: BIOPSY;  Surgeon: OLaurin Coder  MD;  [place]  Service: Pulmonary;;   BRONCHIAL WASHINGS  03/30/2022   Procedure: BRONCHIAL WASHINGS;  Surgeon: OLaurin Coder MD;  [place]  Service: Pulmonary;;   CHOLECYSTECTOMY     COLONOSCOPY N/A 06/02/2013   Procedure: COLONOSCOPY;  Surgeon: SWonda Horner MD;  [facility]  Service: Endoscopy;  Laterality: N/A;   HERNIA REPAIR  2000   INCISION AND DRAINAGE ABSCESS Left 07/07/2016   Procedure: DEBRIDMENT LEFT THIGH ABSCESS, EXISION ACUTE SKIN RASH LEFT THIGH(1CM LESION);  Surgeon: HFanny Skates MD;  [facility];  Service: General;  Laterality: Left;   IR FLUORO GUIDE CV LINE RIGHT  04/24/2022   IR IVC FILTER PLMT / S&I /IMG GUID/MOD SED  05/17/2022   IR [place] VASC ACCESS RIGHT  04/24/2022   SEPTOPLASTY  02/2013   spleen anuyism     splenic aneurysm     TRANSMETATARSAL AMPUTATION Bilateral 10/21/2020   Procedure: Left transmetatarsal amputation;  Surgeon: HWylene Simmer MD;  [place] [facility]  Service: Orthopedics;  Laterality: Bilateral;   TRANSMETATARSAL AMPUTATION Left 03/24/2021   Procedure: TRANSMETATARSAL AMPUTATION;  Surgeon: HWylene Simmer MD;  [facility]  Service: Orthopedics;  Laterality: Left;   VAGINAL HYSTERECTOMY     VIDEO BRONCHOSCOPY Right 03/30/2022   Procedure: VIDEO BRONCHOSCOPY WITH FLUORO;  Surgeon: OLaurin Coder MD;  [facility];  Service: Pulmonary;  Laterality: Right;  atypical pneumonia    Family History  Problem Relation Age of Onset   Diabetes Mother    Heart disease Mother    Diabetes Father     Social History   Socioeconomic History   Marital status: Married    Spouse name: Not on file   Number of children: Not on file   Years of education: Not on file   Highest education level: Not on file  Occupational History   Not on file  Tobacco Use   Smoking status: Never   Smokeless tobacco: Never  Vaping Use   Vaping Use: Never used  Substance and Sexual Activity    Alcohol use: Yes    Comment: occasionally   Drug use: No   Sexual activity: Yes    Birth control/protection: Surgical  Other Topics Concern   Not on file  Social History Narrative   Not on file   Social Determinants of Health   Financial Resource Strain: Not on file  Food Insecurity: Not on file  Transportation Needs: Not on file  Physical Activity: Not on file  Stress: Not on file  Social Connections: Not on file  Intimate Partner Violence: Not on file    Outpatient Medications Prior to Visit  Medication Sig Dispense Refill   acetaminophen (TYLENOL) 325 MG tablet Take 2 tablets (650 mg total) by mouth every 6 (six) hours as needed for mild pain (or Fever >/= 101).     albuterol (VENTOLIN HFA) 108 (90 Base) MCG/ACT inhaler Inhale 2 puffs into the lungs every 6 (six) hours as needed for wheezing or shortness of breath. 1 each 11   Calcium Carb-Cholecalciferol (CALCIUM CARBONATE-VITAMIN D3 PO) Take 1 tablet by mouth daily.     clotrimazole (ANTIFUNGAL CLOTRIMAZOLE) 1 % cream Apply 1 Application topically 2 (two) times daily. 30 g 0   guaiFENesin (MUCINEX) 600 MG 12 hr tablet Take 1 tablet (600 mg total) by mouth 2 (two) times daily. 60 tablet 3   HYDROcodone-acetaminophen (NORCO/VICODIN) 5-325 MG tablet Take 1 tablet by mouth 2 (two) times daily as needed.     levothyroxine (SYNTHROID) 25 MCG tablet Take 1 tablet (25 mcg total) by mouth daily. 30 tablet 0   lidocaine (LIDODERM) 5 % Place 1 patch onto the skin daily as needed (for pain). Remove & Discard patch within 12 hours or as directed by MD     LINZESS 145 MCG CAPS capsule Take one capsule (145 mcg dose) by mouth daily. 30 capsule 0   magnesium oxide (MAG-OX) 400 MG tablet Take 400 mg by mouth daily.     metFORMIN (GLUCOPHAGE-XR) 500 MG 24 hr tablet Take 1 tablet (500 mg total) by mouth daily with breakfast. 30 tablet 0   methocarbamol (ROBAXIN) 500 MG tablet Take 1 tablet (500 mg total) by mouth every 6 (six) hours as needed  for muscle spasms. 30 tablet 0   MIEBO 1.338 GM/ML SOLN Apply to eye.     naloxone (NARCAN) nasal spray 4 mg/0.1 mL Place 0.4 mg into the nose once.     pantoprazole (PROTONIX) 40 MG tablet TAKE (1) TABLET BY MOUTH ONCE DAILY. 30 tablet 0   predniSONE (DELTASONE) 5 MG tablet Take 1 tablet (5 mg total) by mouth daily. 30 tablet 1   promethazine-dextromethorphan (PROMETHAZINE-DM) 6.25-15 MG/5ML syrup Take 5 mLs by mouth 4 (four) times daily as needed for cough. 118 mL 0   senna (SENOKOT) 8.6 MG TABS tablet Take 2 tablets (17.2  mg total) by mouth 2 (two) times daily. 30 tablet 0   topiramate (TOPAMAX) 100 MG tablet TAKE (1) TABLET BY MOUTH ONCE DAILY. 30 tablet 0   triamcinolone cream (KENALOG) 0.1 % Apply 1 Application topically 2 (two) times daily. 30 g 0   Vitamin D, Ergocalciferol, (DRISDOL) 1.25 MG (50000 UNIT) CAPS capsule Take 1 capsule (50,000 Units total) by mouth every Friday. 7 capsule 3   amitriptyline (ELAVIL) 50 MG tablet TAKE 1 TABLET BY MOUTH AT BEDTIME FOR SLEEP. 30 tablet 0   DULoxetine (CYMBALTA) 60 MG capsule Take 60 mg by mouth 2 (two) times daily.     gabapentin (NEURONTIN) 300 MG capsule Take 2 capsules (600 mg total) by mouth 3 (three) times daily. 180 capsule 0   Melatonin 10 MG TABS Take 10 mg by mouth at bedtime as needed (sleep).     rosuvastatin (CRESTOR) 10 MG tablet TAKE (1) TABLET BY MOUTH AT BEDTIME. 30 tablet 0   No facility-administered medications prior to visit.    Allergies  Allergen Reactions   Ibuprofen Other (See Comments)    Pt is unable to take this due to kidney problems.      Penicillins Rash and Other (See Comments)    Has patient had a PCN reaction causing immediate rash, facial/tongue/throat swelling, SOB or lightheadedness with hypotension: No Has patient had a PCN reaction causing severe rash involving mucus membranes or skin necrosis: No Has patient had a PCN reaction that required hospitalization No Has patient had a PCN reaction occurring  within the last 10 years: No If all of the above answers are "NO", then may proceed with Cephalosporin use.    ROS Review of Systems  Constitutional:  Negative for chills and fever.  Eyes:  Negative for visual disturbance.  Respiratory:  Negative for chest tightness and shortness of breath.   Musculoskeletal:  Positive for back pain.  Skin:  Positive for wound. Negative for rash.  Neurological:  Negative for dizziness and headaches.  Psychiatric/Behavioral:  Positive for sleep disturbance.       Objective:    Physical Exam Constitutional:      Appearance: She is obese.  HENT:     Head: Normocephalic.     Mouth/Throat:     Mouth: Mucous membranes are moist.  Cardiovascular:     Rate and Rhythm: Normal rate.     Heart sounds: Normal heart sounds.  Pulmonary:     Effort: Pulmonary effort is normal.     Breath sounds: Normal breath sounds.  Musculoskeletal:     Comments: Right foot ulcer No evidence of infection noted +2 dorsalis pedis and posterior tibial pulses Sensation intact  Skin:    Findings: Lesion (nontender eroded lesions on the lower extremities) present. No rash.  Neurological:     Mental Status: She is alert.     BP 116/68   Pulse 78   Ht '5\' 4"'$  (1.626 m)   Wt 229 lb 1.3 oz (103.9 kg)   SpO2 95%   BMI 39.32 kg/m  Wt Readings from Last 3 Encounters:  01/02/23 229 lb 1.3 oz (103.9 kg)  12/13/22 225 lb (102.1 kg)  11/30/22 224 lb (101.6 kg)    Lab Results  Component Value Date   TSH 4.770 (H) 10/02/2022   Lab Results  Component Value Date   WBC 12.7 (H) 10/02/2022   HGB 13.1 10/02/2022   HCT 40.2 10/02/2022   MCV 91 10/02/2022   PLT 310 10/02/2022   Lab Results  Component Value Date   NA 143 10/02/2022   K 4.3 10/02/2022   CO2 21 10/02/2022   GLUCOSE 123 (H) 10/02/2022   BUN 21 10/02/2022   CREATININE 1.18 (H) 10/02/2022   BILITOT <0.2 10/02/2022   ALKPHOS 134 (H) 10/02/2022   AST 12 10/02/2022   ALT 15 10/02/2022   PROT 6.0  10/02/2022   ALBUMIN 4.0 10/02/2022   CALCIUM 9.6 10/02/2022   ANIONGAP 13 05/18/2022   EGFR 54 (L) 10/02/2022   Lab Results  Component Value Date   CHOL 172 10/02/2022   Lab Results  Component Value Date   HDL 58 10/02/2022   Lab Results  Component Value Date   LDLCALC 75 10/02/2022   Lab Results  Component Value Date   TRIG 243 (H) 10/02/2022   Lab Results  Component Value Date   CHOLHDL 3.0 10/02/2022   Lab Results  Component Value Date   HGBA1C 6.8 (H) 10/02/2022      Assessment & Plan:  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 ([place]) Assessment & Plan:  Bilateral transmetatarsal amputation that occurred 2 years ago She was recently treated for right foot ulcer on 12/13/2022 with doxycycline No evidence of infection noted today Referral was placed to a wound clinic in [place]; however, the patient would like a referral in [place] Referral placed  Orders: -     Ambulatory referral to Wound Clinic  Class 2 obesity Assessment & Plan: Reports increased weight gain The patient is on prednisone chronically She reports that she has been trying to eat healthier A referred was placed to a licensed dietitian; however the patient said that the dietitian never followed up with her She does not want another referral placed Will refer the patient to PREP at [facility]  Encouraged to continue lifestyle modification with a heart-healthy diet   Orders: -     Amb Referral To Provider Referral Exercise Program (P.R.E.P)  Sleep disturbance Assessment & Plan: Patient is currently taking amitriptyline 50 mg nightly and melatonin 10 mg nightly Informed patient not to take both medications We will discontinue melatonin 10 mg nightly Will refill amitriptyline 50 mg nightly   Orders: -     Amitriptyline HCl; TAKE 1 TABLET BY MOUTH AT BEDTIME FOR SLEEP.  Dispense: 30 tablet; Refill: 0  Other hyperlipidemia -     Rosuvastatin Calcium; TAKE (1) TABLET BY MOUTH AT BEDTIME.   Dispense: 30 tablet; Refill: 2  Idiopathic peripheral neuropathy Assessment & Plan: Chronic condition Will discontinue gabapentin 300 mg 3 times daily as patient is on duloxetine 60 mg daily and amitriptyline 50 mg daily Patient is aware of plan of care   Vitamin D deficiency -     VITAMIN D 25 Hydroxy (Vit-D Deficiency, Fractures)  Essential hypertension -     CMP14+EGFR -     CBC with Differential/Platelet  Other specified hypothyroidism -     TSH + free T4  Type 2 diabetes mellitus with hyperosmolarity without coma, without long-term current use of insulin (HCC) -     Microalbumin / creatinine urine ratio -     Hemoglobin A1c -     Lipid panel    Follow-up: Return in about 1 month (around 02/02/2023) for pap smear.   Alvira Monday, [name]

## 2023-01-02 NOTE — Assessment & Plan Note (Addendum)
Reports increased weight gain The patient is on prednisone chronically She reports that she has been trying to eat healthier A referred was placed to a licensed dietitian; however the patient said that the dietitian never followed up with her She does not want another referral placed Will refer the patient to PREP at [facility]  Encouraged to continue lifestyle modification with a heart-healthy diet

## 2023-01-02 NOTE — Assessment & Plan Note (Signed)
Bilateral transmetatarsal amputation that occurred 2 years ago She was recently treated for right foot ulcer on 12/13/2022 with doxycycline No evidence of infection noted today Referral was placed to a wound clinic in [place]; however, the patient would like a referral in [place] Referral placed

## 2023-01-02 NOTE — Patient Instructions (Addendum)
   I appreciate the opportunity to provide care to you today!    Follow up:  1 month for pap smear  Labs: please stop by the lab today to get your blood drawn (CBC, CMP, TSH, Lipid profile, HgA1c, Vit D)  Please pick up your medications at the pharmacy   Referrals today-  PREP at the [facility]   Please continue to a heart-healthy diet and increase your physical activities. Try to exercise for 68mins at least five times a week.      It was a pleasure to see you and I look forward to continuing to work together on your health and well-being. Please do not hesitate to call the office if you need care or have questions about your care.   Have a wonderful day and week. With Gratitude, Alvira Monday MSN, [name]-BC

## 2023-01-02 NOTE — Assessment & Plan Note (Signed)
Chronic condition Will discontinue gabapentin 300 mg 3 times daily as patient is on duloxetine 60 mg daily and amitriptyline 50 mg daily Patient is aware of plan of care

## 2023-01-02 NOTE — Assessment & Plan Note (Signed)
Patient is currently taking amitriptyline 50 mg nightly and melatonin 10 mg nightly Informed patient not to take both medications We will discontinue melatonin 10 mg nightly Will refill amitriptyline 50 mg nightly

## 2023-01-04 ENCOUNTER — Ambulatory Visit (HOSPITAL_COMMUNITY): Payer: BC Managed Care – PPO | Attending: Internal Medicine | Admitting: Physical Therapy

## 2023-01-04 ENCOUNTER — Other Ambulatory Visit: Payer: Self-pay

## 2023-01-04 DIAGNOSIS — L97412 Non-pressure chronic ulcer of right heel and midfoot with fat layer exposed: Secondary | ICD-10-CM | POA: Insufficient documentation

## 2023-01-04 DIAGNOSIS — R262 Difficulty in walking, not elsewhere classified: Secondary | ICD-10-CM | POA: Insufficient documentation

## 2023-01-04 DIAGNOSIS — E11621 Type 2 diabetes mellitus with foot ulcer: Secondary | ICD-10-CM | POA: Diagnosis present

## 2023-01-04 DIAGNOSIS — S91301D Unspecified open wound, right foot, subsequent encounter: Secondary | ICD-10-CM | POA: Insufficient documentation

## 2023-01-04 DIAGNOSIS — L97422 Non-pressure chronic ulcer of left heel and midfoot with fat layer exposed: Secondary | ICD-10-CM | POA: Insufficient documentation

## 2023-01-04 NOTE — Therapy (Addendum)
OUTPATIENT PHYSICAL THERAPY Wound EVALUATION   Patient Name: [name] MRN: [id number] DOB:1966/03/30, 57 y.o., female Today's Date: 01/04/2023   PCP: Alvira Monday, [place] REFERRING PROVIDER: Dell Ponto, [name] END OF SESSION:  PT End of Session - 01/04/23 1430     Visit Number 1    Number of Visits 12    Date for PT Re-Evaluation 02/15/23    Authorization Type BCBS    Progress Note Due on Visit 10    PT Start Time 1345    PT Stop Time 1420    PT Time Calculation (min) 35 min    Activity Tolerance Patient tolerated treatment well    Behavior During Therapy [facility] for tasks assessed/performed             Past Medical History:  Diagnosis Date   Chronic cough    Chronic pain    Diabetes mellitus without complication (HCC)    Dyspnea    GERD (gastroesophageal reflux disease)    Hypokalemia    Hypothyroidism    Left wrist pain 06/01/2021   Myonecrosis ([place]) 07/01/2021   Neurocardiogenic syncope    OSA (obstructive sleep apnea)    does not use CPAP   Osteomyelitis (HCC)    bilateral feet   Peripheral neuropathy    Presence of intrathecal pump    recieves Prialt/bupivicaine   Tear of gluteus medius tendon 07/01/2021   Wegener's granulomatosis 2009   Wegner's disease (congenital syphilitic osteochondritis)    Past Surgical History:  Procedure Laterality Date   AMPUTATION Right 10/21/2020   Procedure: Right hallux amputation;  Surgeon: Wylene Simmer, MD;  Location: [place];  Service: Orthopedics;  Laterality: Right;   AMPUTATION TOE Bilateral 08/12/2020   Procedure: Right 3rd toe amputation; left hallux and 3rd toe amputation;  Surgeon: Wylene Simmer, MD;  Location: [place];  Service: Orthopedics;  Laterality: Bilateral;  66mn   AMPUTATION TOE Right 03/24/2021   Procedure: AMPUTATION TOE;  Surgeon: HWylene Simmer MD;  [place]  Service: Orthopedics;  Laterality: Right;   APPENDECTOMY     BACK SURGERY     BIOPSY   03/30/2022   Procedure: BIOPSY;  Surgeon: OLaurin Coder MD;  [place]  Service: Pulmonary;;   BRONCHIAL WASHINGS  03/30/2022   Procedure: BRONCHIAL WASHINGS;  Surgeon: OLaurin Coder MD;  [place]  Service: Pulmonary;;   CHOLECYSTECTOMY     COLONOSCOPY N/A 06/02/2013   Procedure: COLONOSCOPY;  Surgeon: SWonda Horner MD;  [facility]  Service: Endoscopy;  Laterality: N/A;   HERNIA REPAIR  2000   INCISION AND DRAINAGE ABSCESS Left 07/07/2016   Procedure: DEBRIDMENT LEFT THIGH ABSCESS, EXISION ACUTE SKIN RASH LEFT THIGH(1CM LESION);  Surgeon: HFanny Skates MD;  [facility];  Service: General;  Laterality: Left;   IR FLUORO GUIDE CV LINE RIGHT  04/24/2022   IR IVC FILTER PLMT / S&I /IMG GUID/MOD SED  05/17/2022   IR [place] VASC ACCESS RIGHT  04/24/2022   SEPTOPLASTY  02/2013   spleen anuyism     splenic aneurysm     TRANSMETATARSAL AMPUTATION Bilateral 10/21/2020   Procedure: Left transmetatarsal amputation;  Surgeon: HWylene Simmer MD;  [place]  Service: Orthopedics;  Laterality: Bilateral;   TRANSMETATARSAL AMPUTATION Left 03/24/2021   Procedure: TRANSMETATARSAL AMPUTATION;  Surgeon: HWylene Simmer MD;  [place]  Service: Orthopedics;  Laterality: Left;   VAGINAL HYSTERECTOMY     VIDEO BRONCHOSCOPY  Right 03/30/2022   Procedure: VIDEO BRONCHOSCOPY WITH FLUORO;  Surgeon: Laurin Coder, MD;  Location: [place] [facility];  Service: Pulmonary;  Laterality: Right;  atypical pneumonia   Patient Active Problem List   Diagnosis Date Noted   Sleep disturbance 01/02/2023   Neuropathic ulcer ([place]) 12/13/2022   Tinea of groin 09/30/2022   Need for immunization against influenza 09/30/2022   Aortic atherosclerosis ([place]) 06/29/2022   Type 2 diabetes mellitus ([place]) 06/29/2022   Acute deep vein thrombosis (DVT) of left lower extremity ([place]) 05/17/2022   Rhabdomyolysis 04/18/2022   Transaminitis 04/17/2022   Recurrent  pneumonia 04/12/2022   Pulmonary embolism ([place]) 03/28/2022   Severe sepsis ([place]) 03/26/2022   UTI (urinary tract infection) 03/26/2022   Opioid dependence ([place]) XX123456   Acute metabolic encephalopathy XX123456   Hip pain 02/20/2022   Arthritis of right hip 02/19/2022   Hypothyroidism    AKI (acute kidney injury) ([place])    Class 2 obesity    Depression    Essential hypertension    Acute cystitis without hematuria 01/04/2022   Dysfunction of left rotator cuff 01/04/2022   Lobar pneumonia ([place]) 01/04/2022   Acquired absence of other toe(s), unspecified side ([place]) 08/01/2021   Myositis of right lower extremity 07/01/2021   Tear of gluteus medius tendon 07/01/2021   Left wrist pain 06/01/2021   Medication monitoring encounter 05/10/2021   Chronic pain 03/24/2021   COVID-19 virus infection 11/01/2020   Stage 3a chronic kidney disease ([place]) 09/08/2019   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   Steroid-induced hyperglycemia 05/11/2018   Morbid obesity ([place]) 01/25/2018   Neuropathy 01/25/2018   Presence of intrathecal pump 08/02/2017   Rectocele 03/09/2017   Abnormal finding on imaging 12/19/2016   Esophageal dysphagia 12/19/2016   Current chronic use of systemic steroids 10/26/2016   IFG (impaired fasting glucose) 10/26/2016   Arthralgia of multiple joints 07/15/2016   Headache 07/15/2016   Peripheral neuropathy 07/04/2016   Rash of body 07/04/2016   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   Neuropathic pain of both feet 01/11/2016   OSA (obstructive sleep apnea) 12/29/2015   Chronic insomnia 12/29/2015   Obesity, Class II, BMI 35-39.9, with comorbidity 12/29/2015   Polyneuropathy associated with underlying disease ([place]) 12/29/2015   Class 2 drug-induced obesity with serious comorbidity and body mass index (BMI) of 35.0 to 35.9 in adult 12/29/2015   Other mechanical complication of implanted electronic neurostimulator of spinal cord electrode (lead),  initial encounter ([place]) 08/04/2015   Adjustment disorder with depressed mood 03/29/2015   Mononeuritis 07/20/2014   Obesity (BMI 30-39.9) 07/20/2014   Neurocardiogenic syncope 07/06/2014   Normocytic anemia 06/08/2014   POTS (postural orthostatic tachycardia syndrome) 06/08/2014   Encounter for long-term (current) use of high-risk medication 05/30/2013   Fall 05/30/2013   Other long term (current) drug therapy 05/30/2013   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[place]) 05/30/2013   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[place]) 05/30/2013   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   REFERRING DIAG: E11.621,L97.512 (ICD-10-CM) - Diabetic ulcer of toe of right foot associated with type 2 diabetes mellitus, with fat  layer exposed [facility])  THERAPY DIAG:  Difficulty in walking   Rationale for Evaluation and Treatment: Rehabilitation     Wound Therapy - 01/04/23 0001     Subjective Pt states that she has been trying to heal her wounds for months now.  The MD stated that it had been long enough and referred her to this clinic.    Patient and Family Stated Goals wounds to heal    Date of Onset 08/22/22    Prior Treatments self care    Pain Scale 0-10    Pain Score 7     Pain Type Chronic pain    Pain Location Leg    Pain Orientation right   Pain Descriptors / Indicators Burning    Pain Onset With Activity    Patients Stated Pain Goal 2    Pain Intervention(s) Emotional support    Evaluation and Treatment Procedures Explained to  Patient/Family Yes    Evaluation and Treatment Procedures agreed to    Wound Properties Date First Assessed: 01/04/23 Time First Assessed: 1355   Wound Image Images linked: 1    Dressing Type None    Dressing Changed New    Dressing Status Clean;Dry    Dressing Change Frequency PRN    Site / Wound Assessment Granulation tissue    % Wound base Red or Granulating 90%    % Wound base Yellow/Fibrinous Exudate 10%    Peri-wound Assessment Edema;Other (Comment)   callous   Wound Length (cm) 1.2 cm    Wound Width (cm) 1 cm    Wound Depth (cm) 0.2 cm    Wound Volume (cm^3) 0.24 cm^3    Wound Surface Area (cm^2) 1.2 cm^2    Undermining (cm) 10 o'cloock: .5 cm    Drainage Amount Scant    Treatment Cleansed;Debridement (Selective)    Selective Debridement (non-excisional) - Location edges of wound and callous surrounding    Selective Debridement (non-excisional) - Tools Used Forceps;Scissors    Selective Debridement (non-excisional) - Tissue Removed callous, devitalized tissue    Wound Therapy - Clinical Statement see below    Wound Therapy - Functional Problem List unable to wear shoes , difficult with walking    Factors Delaying/Impairing Wound Healing Altered sensation;Diabetes Mellitus;Multiple medical problems;Polypharmacy    Hydrotherapy Plan Debridement;Dressing change;Patient/family education    Wound Therapy - Frequency 2X / week    Wound Therapy - Current Recommendations PT    Wound Plan debride and dress as needed         Dressing                                                                      After cleansing LE moisturized followed by  Medihoney to all wounds 2x2, kerlix and coban.       PATIENT EDUCATION: Education details: Dressing should not be painful take off if pt has increased pain.  Keep dressing dry take off if it gets wet Person educated: Patient Education method:  Explanation Education comprehension: verbalized understanding   HOME EXERCISE PROGRAM: None issued   GOALS: Goals reviewed with patient? No  SHORT TERM GOALS: Target date: 01/24/2023  Other 7 wounds beside the main wound to be healed  Baseline: Goal status: INITIAL  2.  Pt to be wearing Darco boot to avoid pressure on wound  Baseline:  Goal status: INITIAL    LONG TERM GOALS: Target date: 02/15/2023  Wound to have no depth and be no greater than .5x .5cm in size to allow pt to feel comfortable with self care. Baseline:  Goal status: INITIAL  2.  PT pain to be no greater than a 2/10 Baseline:  Goal status: INITIAL    ASSESSMENT:  CLINICAL IMPRESSION: Patient is a 57 y.o. female who was seen today for physical therapy evaluation and treatment for non healing diabetic wound. PT has the main wound which is located on the incision of her transmetatarsal amputation, however on her LE she has 7 other small wounds all approximately 1.5 x1 cm or smaller .  These wounds have no depth but will not be healing.  PT will benefit from skilled PT to achieve a wound healing environment.     OBJECTIVE IMPAIRMENTS: difficulty walking, obesity, pain, and decreased skin integrity .   ACTIVITY LIMITATIONS: dressing, hygiene/grooming, and locomotion level  PARTICIPATION LIMITATIONS: shopping and community activity  PERSONAL FACTORS: Fitness, Time since onset of injury/illness/exacerbation, and 1-2 comorbidities: obesity, DM, Lt transmetatarsal amputation  are also affecting patient's functional outcome.   REHAB POTENTIAL: Good  CLINICAL DECISION MAKING: Stable/uncomplicated  EVALUATION COMPLEXITY: moderate  PLAN: PT FREQUENCY: 2x/week  PT DURATION: 6 weeks  PLANNED INTERVENTIONS: Patient/Family education, Self Care, and debridement and dressing change.  PLAN FOR NEXT SESSION: Continue to monitor wound debride and dress as appropriate.   Rayetta Humphrey, [place]  CLT [phone]

## 2023-01-05 LAB — CMP14+EGFR
ALT: 26 IU/L (ref 0–32)
AST: 23 IU/L (ref 0–40)
Albumin/Globulin Ratio: 2.3 — ABNORMAL HIGH (ref 1.2–2.2)
Albumin: 4.1 g/dL (ref 3.8–4.9)
Alkaline Phosphatase: 116 IU/L (ref 44–121)
BUN/Creatinine Ratio: 11 (ref 9–23)
BUN: 14 mg/dL (ref 6–24)
Bilirubin Total: 0.3 mg/dL (ref 0.0–1.2)
CO2: 20 mmol/L (ref 20–29)
Calcium: 9.2 mg/dL (ref 8.7–10.2)
Chloride: 106 mmol/L (ref 96–106)
Creatinine, Ser: 1.28 mg/dL — ABNORMAL HIGH (ref 0.57–1.00)
Globulin, Total: 1.8 g/dL (ref 1.5–4.5)
Glucose: 137 mg/dL — ABNORMAL HIGH (ref 70–99)
Potassium: 4.5 mmol/L (ref 3.5–5.2)
Sodium: 141 mmol/L (ref 134–144)
Total Protein: 5.9 g/dL — ABNORMAL LOW (ref 6.0–8.5)
eGFR: 49 mL/min/{1.73_m2} — ABNORMAL LOW (ref >59)

## 2023-01-05 LAB — CBC WITH DIFFERENTIAL/PLATELET
Basophils Absolute: 0.1 10*3/uL (ref 0.0–0.2)
Basos: 1 %
EOS (ABSOLUTE): 0.4 10*3/uL (ref 0.0–0.4)
Eos: 3 %
Hematocrit: 39.6 % (ref 34.0–46.6)
Hemoglobin: 12.7 g/dL (ref 11.1–15.9)
Immature Grans (Abs): 0.4 10*3/uL — ABNORMAL HIGH (ref 0.0–0.1)
Immature Granulocytes: 3 %
Lymphocytes Absolute: 4.2 10*3/uL — ABNORMAL HIGH (ref 0.7–3.1)
Lymphs: 35 %
MCH: 29.7 pg (ref 26.6–33.0)
MCHC: 32.1 g/dL (ref 31.5–35.7)
MCV: 93 fL (ref 79–97)
Monocytes Absolute: 0.9 10*3/uL (ref 0.1–0.9)
Monocytes: 7 %
Neutrophils Absolute: 6 10*3/uL (ref 1.4–7.0)
Neutrophils: 51 %
Platelets: 269 10*3/uL (ref 150–450)
RBC: 4.28 x10E6/uL (ref 3.77–5.28)
RDW: 14.3 % (ref 11.7–15.4)
WBC: 11.9 10*3/uL — ABNORMAL HIGH (ref 3.4–10.8)

## 2023-01-05 LAB — LIPID PANEL
Chol/HDL Ratio: 3.9 ratio (ref 0.0–4.4)
Cholesterol, Total: 163 mg/dL (ref 100–199)
HDL: 42 mg/dL (ref >39)
LDL Chol Calc (NIH): 69 mg/dL (ref 0–99)
Triglycerides: 328 mg/dL — ABNORMAL HIGH (ref 0–149)
VLDL Cholesterol Cal: 52 mg/dL — ABNORMAL HIGH (ref 5–40)

## 2023-01-05 LAB — TSH+FREE T4
Free T4: 0.96 ng/dL (ref 0.82–1.77)
TSH: 4.21 u[IU]/mL (ref 0.450–4.500)

## 2023-01-05 LAB — HEMOGLOBIN A1C
Est. average glucose Bld gHb Est-mCnc: 171 mg/dL
Hgb A1c MFr Bld: 7.6 % — ABNORMAL HIGH (ref 4.8–5.6)

## 2023-01-05 LAB — VITAMIN D 25 HYDROXY (VIT D DEFICIENCY, FRACTURES): Vit D, 25-Hydroxy: 45.6 ng/mL (ref 30.0–100.0)

## 2023-01-06 LAB — MICROALBUMIN / CREATININE URINE RATIO
Creatinine, Urine: 134.4 mg/dL
Microalb/Creat Ratio: 44 mg/g creat — ABNORMAL HIGH (ref 0–29)
Microalbumin, Urine: 58.5 ug/mL

## 2023-01-08 ENCOUNTER — Ambulatory Visit (HOSPITAL_COMMUNITY): Payer: BC Managed Care – PPO | Admitting: Physical Therapy

## 2023-01-08 DIAGNOSIS — R262 Difficulty in walking, not elsewhere classified: Secondary | ICD-10-CM | POA: Diagnosis not present

## 2023-01-08 DIAGNOSIS — S91301D Unspecified open wound, right foot, subsequent encounter: Secondary | ICD-10-CM

## 2023-01-08 DIAGNOSIS — E11621 Type 2 diabetes mellitus with foot ulcer: Secondary | ICD-10-CM

## 2023-01-08 NOTE — Therapy (Signed)
OUTPATIENT PHYSICAL THERAPY Wound Treatment   Patient Name: [name] MRN: [id number] DOB:1966/10/03, 57 y.o., female Today's Date: 01/08/2023   PCP: Alvira Monday, [name] REFERRING PROVIDER: Dell Ponto, [name] END OF SESSION:  PT End of Session - 01/08/23 0857     Visit Number 2    Number of Visits 12    Date for PT Re-Evaluation 02/15/23    Authorization Type BCBS    Progress Note Due on Visit 10    PT Start Time 0823    PT Stop Time 0850    PT Time Calculation (min) 27 min    Activity Tolerance Patient tolerated treatment well    Behavior During Therapy [facility] for tasks assessed/performed             Past Medical History:  Diagnosis Date   Chronic cough    Chronic pain    Diabetes mellitus without complication (HCC)    Dyspnea    GERD (gastroesophageal reflux disease)    Hypokalemia    Hypothyroidism    Left wrist pain 06/01/2021   Myonecrosis ([place]) 07/01/2021   Neurocardiogenic syncope    OSA (obstructive sleep apnea)    does not use CPAP   Osteomyelitis (HCC)    bilateral feet   Peripheral neuropathy    Presence of intrathecal pump    recieves Prialt/bupivicaine   Tear of gluteus medius tendon 07/01/2021   Wegener's granulomatosis 2009   Wegner's disease (congenital syphilitic osteochondritis)    Past Surgical History:  Procedure Laterality Date   AMPUTATION Right 10/21/2020   Procedure: Right hallux amputation;  Surgeon: Wylene Simmer, MD;  Location: [place];  Service: Orthopedics;  Laterality: Right;   AMPUTATION TOE Bilateral 08/12/2020   Procedure: Right 3rd toe amputation; left hallux and 3rd toe amputation;  Surgeon: Wylene Simmer, MD;  Location: [place];  Service: Orthopedics;  Laterality: Bilateral;  66min   AMPUTATION TOE Right 03/24/2021   Procedure: AMPUTATION TOE;  Surgeon: Wylene Simmer, MD;  Location: [place];  Service: Orthopedics;  Laterality: Right;   APPENDECTOMY     BACK SURGERY     BIOPSY   03/30/2022   Procedure: BIOPSY;  Surgeon: Laurin Coder, MD;  Location: [place];  Service: Pulmonary;;   BRONCHIAL WASHINGS  03/30/2022   Procedure: BRONCHIAL WASHINGS;  Surgeon: Laurin Coder, MD;  Location: [place];  Service: Pulmonary;;   CHOLECYSTECTOMY     COLONOSCOPY N/A 06/02/2013   Procedure: COLONOSCOPY;  Surgeon: Wonda Horner, MD;  Location[name];  Service: Endoscopy;  Laterality: N/A;   HERNIA REPAIR  2000   INCISION AND DRAINAGE ABSCESS Left 07/07/2016   Procedure: DEBRIDMENT LEFT THIGH ABSCESS, EXISION ACUTE SKIN RASH LEFT THIGH(1CM LESION);  Surgeon: Fanny Skates, MD;  [facility];  Service: General;  Laterality: Left;   IR FLUORO GUIDE CV LINE RIGHT  04/24/2022   IR IVC FILTER PLMT / S&I /IMG GUID/MOD SED  05/17/2022   IR US GUIDE VASC ACCESS RIGHT  04/24/2022   SEPTOPLASTY  02/2013   spleen anuyism     splenic aneurysm     TRANSMETATARSAL AMPUTATION Bilateral 10/21/2020   Procedure: Left transmetatarsal amputation;  Surgeon: Wylene Simmer, MD;  Location: [place];  Service: Orthopedics;  Laterality: Bilateral;   TRANSMETATARSAL AMPUTATION Left 03/24/2021   Procedure: TRANSMETATARSAL AMPUTATION;  Surgeon: Wylene Simmer, MD;  Location: [place];  Service: Orthopedics;  Laterality: Left;   VAGINAL HYSTERECTOMY     VIDEO BRONCHOSCOPY  Right 03/30/2022   Procedure: VIDEO BRONCHOSCOPY WITH FLUORO;  Surgeon: Laurin Coder, MD;  Location: [place] [facility];  Service: Pulmonary;  Laterality: Right;  atypical pneumonia   Patient Active Problem List   Diagnosis Date Noted   Sleep disturbance 01/02/2023   Neuropathic ulcer ([place]) 12/13/2022   Tinea of groin 09/30/2022   Need for immunization against influenza 09/30/2022   Aortic atherosclerosis ([place]) 06/29/2022   Type 2 diabetes mellitus ([place]) 06/29/2022   Acute deep vein thrombosis (DVT) of left lower extremity ([place]) 05/17/2022   Rhabdomyolysis 04/18/2022   Transaminitis 04/17/2022   Recurrent  pneumonia 04/12/2022   Pulmonary embolism ([place]) 03/28/2022   Severe sepsis ([place]) 03/26/2022   UTI (urinary tract infection) 03/26/2022   Opioid dependence ([place]) XX123456   Acute metabolic encephalopathy XX123456   Hip pain 02/20/2022   Arthritis of right hip 02/19/2022   Hypothyroidism    AKI (acute kidney injury) ([place])    Class 2 obesity    Depression    Essential hypertension    Acute cystitis without hematuria 01/04/2022   Dysfunction of left rotator cuff 01/04/2022   Lobar pneumonia ([place]) 01/04/2022   Acquired absence of other toe(s), unspecified side ([place]) 08/01/2021   Myositis of right lower extremity 07/01/2021   Tear of gluteus medius tendon 07/01/2021   Left wrist pain 06/01/2021   Medication monitoring encounter 05/10/2021   Chronic pain 03/24/2021   COVID-19 virus infection 11/01/2020   Stage 3a chronic kidney disease ([place]) 09/08/2019   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   Steroid-induced hyperglycemia 05/11/2018   Morbid obesity ([place]) 01/25/2018   Neuropathy 01/25/2018   Presence of intrathecal pump 08/02/2017   Rectocele 03/09/2017   Abnormal finding on imaging 12/19/2016   Esophageal dysphagia 12/19/2016   Current chronic use of systemic steroids 10/26/2016   IFG (impaired fasting glucose) 10/26/2016   Arthralgia of multiple joints 07/15/2016   Headache 07/15/2016   Peripheral neuropathy 07/04/2016   Rash of body 07/04/2016   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   Neuropathic pain of both feet 01/11/2016   OSA (obstructive sleep apnea) 12/29/2015   Chronic insomnia 12/29/2015   Obesity, Class II, BMI 35-39.9, with comorbidity 12/29/2015   Polyneuropathy associated with underlying disease ([place]) 12/29/2015   Class 2 drug-induced obesity with serious comorbidity and body mass index (BMI) of 35.0 to 35.9 in adult 12/29/2015   Other mechanical complication of implanted electronic neurostimulator of spinal cord electrode (lead),  initial encounter ([place]) 08/04/2015   Adjustment disorder with depressed mood 03/29/2015   Mononeuritis 07/20/2014   Obesity (BMI 30-39.9) 07/20/2014   Neurocardiogenic syncope 07/06/2014   Normocytic anemia 06/08/2014   POTS (postural orthostatic tachycardia syndrome) 06/08/2014   Encounter for long-term (current) use of high-risk medication 05/30/2013   Fall 05/30/2013   Other long term (current) drug therapy 05/30/2013   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[place]) 05/30/2013   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[place]) 05/30/2013   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   REFERRING DIAG: E11.621,L97.512 (ICD-10-CM) - Diabetic ulcer of toe of right foot associated with type 2 diabetes mellitus, with fat  layer exposed [facility])  THERAPY DIAG:  Difficulty in walking   Rationale for Evaluation and Treatment: Rehabilitation     01/08/23 0001  Subjective Assessment  Subjective PT states her wound has been weeping, she does not have an appointment until next week.  Patient and Family Stated Goals wounds to heal  Date of Onset 08/22/22  Prior Treatments self care  Pain Assessment  Pain Scale 0-10  Pain Score 5  Pain Type Chronic pain  Pain Location Leg  Pain Orientation Right  Evaluation and Treatment  Evaluation and Treatment Procedures Explained to Patient/Family Yes  Evaluation and Treatment Procedures agreed to  Wound / Incision (Open or Dehisced) 01/04/23  Date First Assessed/Time First Assessed: 01/04/23 1355    Dressing  Type None;Compression wrap  Dressing Changed Changed  Dressing Status Clean;Dry  Dressing Change Frequency PRN  Site / Wound Assessment Granulation tissue  % Wound base Red or Granulating 95%  % Wound base Yellow/Fibrinous Exudate 5%  Peri-wound Assessment Edema;Other (Comment) (callous)  Drainage Amount Minimal  Treatment Cleansed;Debridement (Selective)  Selective Debridement (non-excisional)  Selective Debridement (non-excisional) - Location edges of wound and callous surrounding  Selective Debridement (non-excisional) - Tools Used Forceps;Scissors  Selective Debridement (non-excisional) - Tissue Removed callous, devitalized tissue  Wound Therapy - Assess/Plan/Recommendations  Wound Therapy - Clinical Statement see below  Wound Therapy - Functional Problem List unable to wear shoes , difficult with walking  Factors Delaying/Impairing Wound Healing Altered sensation;Diabetes Mellitus;Multiple medical problems;Polypharmacy  Hydrotherapy Plan Debridement;Dressing change;Patient/family education  Wound Therapy - Frequency 2X / week  Wound Therapy - Current Recommendations PT  Wound Plan debride and dress as needed                                                                                                 Medihoney to all wounds 2x2, kerlix and coban.       PATIENT EDUCATION: Education details: Dressing should not be painful take off if pt has increased pain.  Keep dressing dry take off if it gets wet Person educated: Patient Education method: Explanation Education comprehension: verbalized understanding   HOME EXERCISE PROGRAM: None issued   GOALS: Goals reviewed with patient? Yes  SHORT TERM GOALS: Target date: 01/24/2023  Other 7 wounds beside the main wound to be healed  Baseline: Goal status: IN PROGRESS  2.  Pt to be wearing Darco boot to avoid pressure on wound  Baseline:  Goal status: IN PROGRESS    LONG TERM GOALS: Target date: 02/15/2023  Wound to  have no depth and be no greater than .5x .5cm in size to allow pt to feel comfortable with self care. Baseline:  Goal status: IN PROGRESS  2.  PT pain to be no greater than a 2/10 Baseline:  Goal status: IN PROGRESS    ASSESSMENT:  CLINICAL IMPRESSION: Patient is a 57 y.o. female who was seen today for physical therapy  treatment for non healing diabetic wound. PT has the main wound which is located on the incision of her transmetatarsal amputation.  Pt only has 4/7 other small wounds on her Rt LE  at this time.  All smaller wounds are approximately 1.5 x1 cm or smaller .  PT will continue  benefit from skilled PT to achieve a wound healing environment.     OBJECTIVE IMPAIRMENTS: difficulty walking, obesity, pain, and decreased skin integrity .   ACTIVITY LIMITATIONS: dressing, hygiene/grooming, and locomotion level  PARTICIPATION LIMITATIONS: shopping and community activity  PERSONAL FACTORS: Fitness, Time since onset of injury/illness/exacerbation, and 1-2 comorbidities: obesity, DM, Lt transmetatarsal amputation  are also affecting patient's functional outcome.   REHAB POTENTIAL: Good  CLINICAL DECISION MAKING: Stable/uncomplicated  EVALUATION COMPLEXITY: moderate  PLAN: PT FREQUENCY: 2x/week  PT DURATION: 6 weeks  PLANNED INTERVENTIONS: Patient/Family education, Self Care, and debridement and dressing change.  PLAN FOR NEXT SESSION: Continue to monitor wound debride and dress as appropriate.   Rayetta Humphrey, [place] CLT [phone]

## 2023-01-09 ENCOUNTER — Other Ambulatory Visit: Payer: Self-pay | Admitting: Family Medicine

## 2023-01-09 DIAGNOSIS — E1100 Type 2 diabetes mellitus with hyperosmolarity without nonketotic hyperglycemic-hyperosmolar coma (NKHHC): Secondary | ICD-10-CM

## 2023-01-09 MED ORDER — METFORMIN HCL 500 MG PO TABS: 500.0000 mg | ORAL_TABLET | Freq: Two times a day (BID) | ORAL | 3 refills | Status: DC

## 2023-01-09 NOTE — Progress Notes (Signed)
Your triglyceride levels are elevated. I recommend taking over-the-counter fish oil 2000 mg twice daily. Your hemoglobin A1c levels have increased. A prescription for metformin 500 mg twice daily has been sent to your pharmacy. I recommend increasing your fluid intake to at least 64 ounces daily. All the labs are stable.

## 2023-01-09 NOTE — Progress Notes (Signed)
Can she sent a pic of the rash

## 2023-01-10 ENCOUNTER — Telehealth: Payer: Self-pay | Admitting: Family Medicine

## 2023-01-10 NOTE — Telephone Encounter (Signed)
[facility], [phone]   Called in regards to patient medication being stopped

## 2023-01-11 ENCOUNTER — Ambulatory Visit (HOSPITAL_COMMUNITY): Payer: BC Managed Care – PPO | Admitting: Physical Therapy

## 2023-01-11 DIAGNOSIS — S91301D Unspecified open wound, right foot, subsequent encounter: Secondary | ICD-10-CM

## 2023-01-11 DIAGNOSIS — E11621 Type 2 diabetes mellitus with foot ulcer: Secondary | ICD-10-CM

## 2023-01-11 DIAGNOSIS — R262 Difficulty in walking, not elsewhere classified: Secondary | ICD-10-CM | POA: Diagnosis not present

## 2023-01-11 NOTE — Therapy (Addendum)
OUTPATIENT PHYSICAL THERAPY Wound Treatment   Patient Name: [name] MRN: [id number] DOB:[birth year], 57 y.o., female Today's Date: 01/17/2023   PCP: Alvira Monday, [place] REFERRING PROVIDER: Dell Ponto, [name]   END OF SESSION:  PT End of Session - 01/11/23    Visit Number 3    Number of Visits 12    Date for PT Re-Evaluation 02/15/23    Authorization Type BC[employer]    Progress Note Due on Visit 10    PT Start Time 0945    PT Stop Time 1020    PT Time Calculation (min) 35 min    Activity Tolerance Patient tolerated treatment well    Behavior During Therapy [facility] for tasks assessed/performed              Past Medical History:  Diagnosis Date   Chronic cough    Chronic pain    Diabetes mellitus without complication (HCC)    Dyspnea    GERD (gastroesophageal reflux disease)    Hypokalemia    Hypothyroidism    Left wrist pain 06/01/2021   Myonecrosis ([place]) 07/01/2021   Neurocardiogenic syncope    OSA (obstructive sleep apnea)    does not use CPAP   Osteomyelitis ([place])    bilateral feet   Peripheral neuropathy    Presence of intrathecal pump    recieves Prialt/bupivicaine   Tear of gluteus medius tendon 07/01/2021   Wegener's granulomatosis 2009   Wegner's disease (congenital syphilitic osteochondritis)    Past Surgical History:  Procedure Laterality Date   AMPUTATION Right 10/21/2020   Procedure: Right hallux amputation;  Surgeon: Wylene Simmer, MD;  Location: [place];  Service: Orthopedics;  Laterality: Right;   AMPUTATION TOE Bilateral 08/12/2020   Procedure: Right 3rd toe amputation; left hallux and 3rd toe amputation;  Surgeon: Wylene Simmer, MD;  Location: [place];  Service: Orthopedics;  Laterality: Bilateral;  88min   AMPUTATION TOE Right 03/24/2021   Procedure: AMPUTATION TOE;  Surgeon: Wylene Simmer, MD;  Location: [place];  Service: Orthopedics;  Laterality: Right;   APPENDECTOMY     BACK SURGERY     BIOPSY   03/30/2022   Procedure: BIOPSY;  Surgeon: Laurin Coder, MD;  Location: [place];  Service: Pulmonary;;   BRONCHIAL WASHINGS  03/30/2022   Procedure: BRONCHIAL WASHINGS;  Surgeon: Laurin Coder, MD;  Location: [place];  Service: Pulmonary;;   CHOLECYSTECTOMY     COLONOSCOPY N/A 06/02/2013   Procedure: COLONOSCOPY;  Surgeon: Wonda Horner, MD;  Location: [facility] ENDOSCOPY;  Service: Endoscopy;  Laterality: N/A;   HERNIA REPAIR  2000   INCISION AND DRAINAGE ABSCESS Left 07/07/2016   Procedure: DEBRIDMENT LEFT THIGH ABSCESS, EXISION ACUTE SKIN RASH LEFT THIGH(1CM LESION);  Surgeon: Fanny Skates, MD;  Location: [facility];  Service: General;  Laterality: Left;   IR FLUORO GUIDE CV LINE RIGHT  04/24/2022   IR IVC FILTER PLMT / S&I /IMG GUID/MOD SED  05/17/2022   IR US GUIDE VASC ACCESS RIGHT  04/24/2022   SEPTOPLASTY  02/2013   spleen anuyism     splenic aneurysm     TRANSMETATARSAL AMPUTATION Bilateral 10/21/2020   Procedure: Left transmetatarsal amputation;  Surgeon: Wylene Simmer, MD;  Location: [place];  Service: Orthopedics;  Laterality: Bilateral;   TRANSMETATARSAL AMPUTATION Left 03/24/2021   Procedure: TRANSMETATARSAL AMPUTATION;  Surgeon: Wylene Simmer, MD;  Location: [place];  Service: Orthopedics;  Laterality: Left;   VAGINAL HYSTERECTOMY     VIDEO  BRONCHOSCOPY Right 03/30/2022   Procedure: VIDEO BRONCHOSCOPY WITH FLUORO;  Surgeon: Laurin Coder, MD;  Location: [place] [facility];  Service: Pulmonary;  Laterality: Right;  atypical pneumonia   Patient Active Problem List   Diagnosis Date Noted   Sleep disturbance 01/02/2023   Neuropathic ulcer ([place]) 12/13/2022   Tinea of groin 09/30/2022   Need for immunization against influenza 09/30/2022   Aortic atherosclerosis ([place]) 06/29/2022   Type 2 diabetes mellitus ([place]) 06/29/2022   Acute deep vein thrombosis (DVT) of left lower extremity ([place]) 05/17/2022   Rhabdomyolysis 04/18/2022   Transaminitis 04/17/2022   Recurrent  pneumonia 04/12/2022   Pulmonary embolism ([place]) 03/28/2022   Severe sepsis ([place]) 03/26/2022   UTI (urinary tract infection) 03/26/2022   Opioid dependence ([place]) [id number]   Acute metabolic encephalopathy [id number]   Hip pain 02/20/2022   Arthritis of right hip 02/19/2022   Hypothyroidism    AKI (acute kidney injury) ([place])    Class 2 obesity    Depression    Essential hypertension    Acute cystitis without hematuria 01/04/2022   Dysfunction of left rotator cuff 01/04/2022   Lobar pneumonia ([place]) 01/04/2022   Acquired absence of other toe(s), unspecified side ([place]) 08/01/2021   Myositis of right lower extremity 07/01/2021   Tear of gluteus medius tendon 07/01/2021   Left wrist pain 06/01/2021   Medication monitoring encounter 05/10/2021   Chronic pain 03/24/2021   COVID-19 virus infection 11/01/2020   Stage 3a chronic kidney disease ([place]) 09/08/2019   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   Steroid-induced hyperglycemia 05/11/2018   Morbid obesity ([place]) 01/25/2018   Neuropathy 01/25/2018   Presence of intrathecal pump 08/02/2017   Rectocele 03/09/2017   Abnormal finding on imaging 12/19/2016   Esophageal dysphagia 12/19/2016   Current chronic use of systemic steroids 10/26/2016   IFG (impaired fasting glucose) 10/26/2016   Arthralgia of multiple joints 07/15/2016   Headache 07/15/2016   Peripheral neuropathy 07/04/2016   Rash of body 07/04/2016   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   Neuropathic pain of both feet 01/11/2016   OSA (obstructive sleep apnea) 12/29/2015   Chronic insomnia 12/29/2015   Obesity, Class II, BMI 35-39.9, with comorbidity 12/29/2015   Polyneuropathy associated with underlying disease ([place]) 12/29/2015   Class 2 drug-induced obesity with serious comorbidity and body mass index (BMI) of 35.0 to 35.9 in adult 12/29/2015   Other mechanical complication of implanted electronic neurostimulator of spinal cord electrode (lead),  initial encounter ([place]) 08/04/2015   Adjustment disorder with depressed mood 03/29/2015   Mononeuritis 07/20/2014   Obesity (BMI 30-39.9) 07/20/2014   Neurocardiogenic syncope 07/06/2014   Normocytic anemia 06/08/2014   POTS (postural orthostatic tachycardia syndrome) 06/08/2014   Encounter for long-term (current) use of high-risk medication 05/30/2013   Fall 05/30/2013   Other long term (current) drug therapy 05/30/2013   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[place]) 05/30/2013   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[place]) 05/30/2013   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   REFERRING DIAG: E11.621,L97.512 (ICD-10-CM) - Diabetic ulcer of toe of right foot associated with type 2 diabetes mellitus, with  fat layer exposed ([place])  THERAPY DIAG:  Difficulty in walking   Rationale for Evaluation and Treatment: Rehabilitation     Wound Therapy     Wound Properties Date First Assessed: 01/04/23 Time First Assessed: 1355             01/11/23  Subjective Assessment  Subjective PT states her wound has been weeping, she does not have an appointment until next week.  Patient and Family Stated Goals wounds to heal  Date of Onset 08/22/22  Prior Treatments self care  Pain Assessment  Pain Scale 0-10  Pain Score 5  Pain Type Chronic pain  Pain Location Leg  Pain Orientation Right  Evaluation and Treatment  Evaluation and Treatment Procedures Explained to Patient/Family Yes  Evaluation and Treatment Procedures agreed to  Wound /  Incision (Open or Dehisced) 01/04/23  Date First Assessed/Time First Assessed: 01/04/23 1355    Dressing Type None;Compression wrap  Dressing Changed Changed  Dressing Status Clean;Dry  Dressing Change Frequency PRN  Site / Wound Assessment Granulation tissue  % Wound base Red or Granulating 95%  % Wound base Yellow/Fibrinous Exudate 5%  Peri-wound Assessment Edema;Other (Comment) (callous)  Drainage Amount Minimal  Treatment Cleansed;Debridement (Selective)  Selective Debridement (non-excisional)  Selective Debridement (non-excisional) - Location edges of wound and callous surrounding  Selective Debridement (non-excisional) - Tools Used Forceps;Scissors  Selective Debridement (non-excisional) - Tissue Removed callous, devitalized tissue  Wound Therapy - Assess/Plan/Recommendations  Wound Therapy - Clinical Statement see below  Wound Therapy - Functional Problem List unable to wear shoes , difficult with walking  Factors Delaying/Impairing Wound Healing Altered sensation;Diabetes Mellitus;Multiple medical problems;Polypharmacy  Hydrotherapy Plan Debridement;Dressing change;Patient/family education  Wound Therapy - Frequency 2X / week  Wound Therapy - Current Recommendations PT  Wound Plan debride and dress as needed                                                                                                  Medihoney to all wounds 2x2, kerlix and coban.    PATIENT EDUCATION: Education details: Dressing should not be painful take off if pt has increased pain.  Keep dressing dry take off if it gets wet Person educated: Patient Education method: Explanation Education comprehension: verbalized understanding   HOME EXERCISE PROGRAM: None issued   GOALS: Goals reviewed with patient? Yes  SHORT TERM GOALS: Target date: 01/24/2023  Other 7 wounds beside the main wound to be healed  Baseline: Goal status: IN PROGRESS  2.  Pt to be wearing [place] boot to avoid pressure on  wound  Baseline:  Goal status: IN PROGRESS    LONG TERM GOALS: Target date: 02/15/2023  Wound to have no depth and be no greater than .5x .5cm in size to allow pt to feel comfortable with self care. Baseline:  Goal status: IN PROGRESS  2.  PT pain to be no greater than a 2/10 Baseline:  Goal status: IN PROGRESS    ASSESSMENT:  CLINICAL IMPRESSION: All small abrasions on LE are headed with wound in line of trans-met amputation line the only one remaining.  Cleansed  LE well, moisturized and debrided wound.  Mostly granulated with some adherent slough remaining.  Appears to be approximating as compared to original photograph.  Continued with medihoney as responding well to this.  No order received for Lt LE but encouraged pt to keep open wounds covered as she had no dressings/bandaids over.   PT will continue  benefit from skilled PT to achieve a wound healing environment.     OBJECTIVE IMPAIRMENTS: difficulty walking, obesity, pain, and decreased skin integrity .   ACTIVITY LIMITATIONS: dressing, hygiene/grooming, and locomotion level  PARTICIPATION LIMITATIONS: shopping and community activity  PERSONAL FACTORS: Fitness, Time since onset of injury/illness/exacerbation, and 1-2 comorbidities: obesity, DM, Lt transmetatarsal amputation  are also affecting patient's functional outcome.   REHAB POTENTIAL: Good  CLINICAL DECISION MAKING: Stable/uncomplicated  EVALUATION COMPLEXITY: moderate  PLAN: PT FREQUENCY: 2x/week  PT DURATION: 6 weeks  PLANNED INTERVENTIONS: Patient/Family education, Self Care, and debridement and dressing change.  PLAN FOR NEXT SESSION: Continue to monitor wound debride and dress as appropriate.   Teena Irani, PTA/CLT [place] Ph: [phone]   Teena Irani, PTA 01/17/2023, 10:16 AM

## 2023-01-12 ENCOUNTER — Telehealth: Payer: Self-pay | Admitting: Family Medicine

## 2023-01-12 ENCOUNTER — Other Ambulatory Visit: Payer: Self-pay | Admitting: Family Medicine

## 2023-01-12 DIAGNOSIS — R399 Unspecified symptoms and signs involving the genitourinary system: Secondary | ICD-10-CM

## 2023-01-12 MED ORDER — SULFAMETHOXAZOLE-TRIMETHOPRIM 800-160 MG PO TABS: 1.0000 | ORAL_TABLET | Freq: Two times a day (BID) | ORAL | 0 refills | Status: AC

## 2023-01-12 NOTE — Telephone Encounter (Signed)
Patient called about urine lab results

## 2023-01-12 NOTE — Telephone Encounter (Signed)
Urine dip was missed on her office visit only sent to lab for microalbumin pt reports sx of uti, is unable to drive here to leave a sample, asking for an antibiotic.

## 2023-01-12 NOTE — Telephone Encounter (Signed)
Returned call, [facility] needed to know why gabapentin had been discontinued, looking back at lov notes Peter [place] states pt is on duloxetine and amitriptyline and gabapentin would be discontinued for that reason, spoke to [place] and informed her of this.

## 2023-01-12 NOTE — Telephone Encounter (Signed)
I called and spoke with the patient; she complained of dysuria, urgency, and frequency of urination. No fever or chills were reported. She complains of abdominal, back, and lower back pain. Will be treated symptomatically with Bactrim for 5 days. The prescription was sent to the pharmacy. Encourage the patient to leave a urine sample on Monday for culture and sensitivity.

## 2023-01-12 NOTE — Telephone Encounter (Signed)
What are her  symptoms?  

## 2023-01-15 NOTE — Telephone Encounter (Signed)
I spoke with her on Friday and sent in the prescription.

## 2023-01-15 NOTE — Telephone Encounter (Signed)
Pt reports Burning with urination, low back and pelvic pain.

## 2023-01-18 ENCOUNTER — Ambulatory Visit (HOSPITAL_COMMUNITY): Payer: BC Managed Care – PPO | Admitting: Physical Therapy

## 2023-01-19 ENCOUNTER — Other Ambulatory Visit: Payer: Self-pay | Admitting: Family Medicine

## 2023-01-19 DIAGNOSIS — E038 Other specified hypothyroidism: Secondary | ICD-10-CM

## 2023-01-22 ENCOUNTER — Ambulatory Visit (HOSPITAL_COMMUNITY): Payer: BC Managed Care – PPO | Attending: Internal Medicine | Admitting: Physical Therapy

## 2023-01-22 DIAGNOSIS — S81802D Unspecified open wound, left lower leg, subsequent encounter: Secondary | ICD-10-CM | POA: Insufficient documentation

## 2023-01-22 DIAGNOSIS — L97412 Non-pressure chronic ulcer of right heel and midfoot with fat layer exposed: Secondary | ICD-10-CM | POA: Diagnosis present

## 2023-01-22 DIAGNOSIS — S91301A Unspecified open wound, right foot, initial encounter: Secondary | ICD-10-CM | POA: Insufficient documentation

## 2023-01-22 DIAGNOSIS — S91302A Unspecified open wound, left foot, initial encounter: Secondary | ICD-10-CM | POA: Diagnosis not present

## 2023-01-22 DIAGNOSIS — R262 Difficulty in walking, not elsewhere classified: Secondary | ICD-10-CM | POA: Diagnosis present

## 2023-01-22 DIAGNOSIS — S91301D Unspecified open wound, right foot, subsequent encounter: Secondary | ICD-10-CM | POA: Insufficient documentation

## 2023-01-22 DIAGNOSIS — E11621 Type 2 diabetes mellitus with foot ulcer: Secondary | ICD-10-CM | POA: Insufficient documentation

## 2023-01-22 DIAGNOSIS — X58XXXD Exposure to other specified factors, subsequent encounter: Secondary | ICD-10-CM | POA: Diagnosis not present

## 2023-01-22 NOTE — Therapy (Signed)
OUTPATIENT PHYSICAL THERAPY Wound Treatment   Patient Name: [name] MRN: [id number] DOB:[birth year], 57 y.o., female Today's Date: 01/22/2023   PCP: Alvira Monday, [place] REFERRING PROVIDER: Dell Ponto, [name]   END OF SESSION:  PT End of Session - 01/11/23    Visit Number 3    Number of Visits 12    Date for PT Re-Evaluation 02/15/23    Authorization Type BCBS    Progress Note Due on Visit 10    PT Start Time 0945    PT Stop Time 1020    PT Time Calculation (min) 35 min    Activity Tolerance Patient tolerated treatment well    Behavior During Therapy [facility] for tasks assessed/performed              Past Medical History:  Diagnosis Date   Chronic cough    Chronic pain    Diabetes mellitus without complication (HCC)    Dyspnea    GERD (gastroesophageal reflux disease)    Hypokalemia    Hypothyroidism    Left wrist pain 06/01/2021   Myonecrosis ([place]) 07/01/2021   Neurocardiogenic syncope    OSA (obstructive sleep apnea)    does not use CPAP   Osteomyelitis ([place])    bilateral feet   Peripheral neuropathy    Presence of intrathecal pump    recieves Prialt/bupivicaine   Tear of gluteus medius tendon 07/01/2021   Wegener's granulomatosis 2009   Wegner's disease (congenital syphilitic osteochondritis)    Past Surgical History:  Procedure Laterality Date   AMPUTATION Right 10/21/2020   Procedure: Right hallux amputation;  Surgeon: Wylene Simmer, MD;  Location: [place];  Service: Orthopedics;  Laterality: Right;   AMPUTATION TOE Bilateral 08/12/2020   Procedure: Right 3rd toe amputation; left hallux and 3rd toe amputation;  Surgeon: Wylene Simmer, MD;  Location: [place];  Service: Orthopedics;  Laterality: Bilateral;  94min   AMPUTATION TOE Right 03/24/2021   Procedure: AMPUTATION TOE;  Surgeon: Wylene Simmer, MD;  Location: [place];  Service: Orthopedics;  Laterality: Right;   APPENDECTOMY     BACK SURGERY     BIOPSY   03/30/2022   Procedure: BIOPSY;  Surgeon: Laurin Coder, MD;  Location: [place];  Service: Pulmonary;;   BRONCHIAL WASHINGS  03/30/2022   Procedure: BRONCHIAL WASHINGS;  Surgeon: Laurin Coder, MD;  Location: [place];  Service: Pulmonary;;   CHOLECYSTECTOMY     COLONOSCOPY N/A 06/02/2013   Procedure: COLONOSCOPY;  Surgeon: Wonda Horner, MD;  Location: [facility] ENDOSCOPY;  Service: Endoscopy;  Laterality: N/A;   HERNIA REPAIR  2000   INCISION AND DRAINAGE ABSCESS Left 07/07/2016   Procedure: DEBRIDMENT LEFT THIGH ABSCESS, EXISION ACUTE SKIN RASH LEFT THIGH(1CM LESION);  Surgeon: Fanny Skates, MD;  Location: [facility];  Service: General;  Laterality: Left;   IR FLUORO GUIDE CV LINE RIGHT  04/24/2022   IR IVC FILTER PLMT / S&I /IMG GUID/MOD SED  05/17/2022   IR US GUIDE VASC ACCESS RIGHT  04/24/2022   SEPTOPLASTY  02/2013   spleen anuyism     splenic aneurysm     TRANSMETATARSAL AMPUTATION Bilateral 10/21/2020   Procedure: Left transmetatarsal amputation;  Surgeon: Wylene Simmer, MD;  Location: [place];  Service: Orthopedics;  Laterality: Bilateral;   TRANSMETATARSAL AMPUTATION Left 03/24/2021   Procedure: TRANSMETATARSAL AMPUTATION;  Surgeon: Wylene Simmer, MD;  Location: [place];  Service: Orthopedics;  Laterality: Left;   VAGINAL HYSTERECTOMY     VIDEO  BRONCHOSCOPY Right 03/30/2022   Procedure: VIDEO BRONCHOSCOPY WITH FLUORO;  Surgeon: Laurin Coder, MD;  Location: [place] [facility]OSCOPY;  Service: Pulmonary;  Laterality: Right;  atypical pneumonia   Patient Active Problem List   Diagnosis Date Noted   Sleep disturbance 01/02/2023   Neuropathic ulcer 12/13/2022   Tinea of groin 09/30/2022   Need for immunization against influenza 09/30/2022   Aortic atherosclerosis 06/29/2022   Type 2 diabetes mellitus 06/29/2022   Acute deep vein thrombosis (DVT) of left lower extremity 05/17/2022   Rhabdomyolysis 04/18/2022   Transaminitis 04/17/2022   Recurrent pneumonia 04/12/2022    Pulmonary embolism 03/28/2022   Severe sepsis 03/26/2022   UTI (urinary tract infection) 03/26/2022   Opioid dependence [id number]   Acute metabolic encephalopathy [id number]   Hip pain 02/20/2022   Arthritis of right hip 02/19/2022   Hypothyroidism    AKI (acute kidney injury)    Class 2 obesity    Depression    Essential hypertension    Acute cystitis without hematuria 01/04/2022   Dysfunction of left rotator cuff 01/04/2022   Lobar pneumonia 01/04/2022   Acquired absence of other toe(s), unspecified side 08/01/2021   Myositis of right lower extremity 07/01/2021   Tear of gluteus medius tendon 07/01/2021   Left wrist pain 06/01/2021   Medication monitoring encounter 05/10/2021   Chronic pain 03/24/2021   COVID-19 virus infection 11/01/2020   Stage 3a chronic kidney disease 09/08/2019   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   Steroid-induced hyperglycemia 05/11/2018   Morbid obesity 01/25/2018   Neuropathy 01/25/2018   Presence of intrathecal pump 08/02/2017   Rectocele 03/09/2017   Abnormal finding on imaging 12/19/2016   Esophageal dysphagia 12/19/2016   Current chronic use of systemic steroids 10/26/2016   IFG (impaired fasting glucose) 10/26/2016   Arthralgia of multiple joints 07/15/2016   Headache 07/15/2016   Peripheral neuropathy 07/04/2016   Rash of body 07/04/2016   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   Neuropathic pain of both feet 01/11/2016   OSA (obstructive sleep apnea) 12/29/2015   Chronic insomnia 12/29/2015   Obesity, Class II, BMI 35-39.9, with comorbidity 12/29/2015   Polyneuropathy associated with underlying disease 12/29/2015   Class 2 drug-induced obesity with serious comorbidity and body mass index (BMI) of 35.0 to 35.9 in adult 12/29/2015   Other mechanical complication of implanted electronic neurostimulator of spinal cord electrode (lead), initial encounter 08/04/2015   Adjustment disorder with depressed mood 03/29/2015    Mononeuritis 07/20/2014   Obesity (BMI 30-39.9) 07/20/2014   Neurocardiogenic syncope 07/06/2014   Normocytic anemia 06/08/2014   POTS (postural orthostatic tachycardia syndrome) 06/08/2014   Encounter for long-term (current) use of high-risk medication 05/30/2013   Fall 05/30/2013   Other long term (current) drug therapy 05/30/2013   Adrenocortical insufficiency 05/30/2013   Glucocorticoid deficiency 05/30/2013   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   REFERRING DIAG: E11.621,L97.512 (ICD-10-CM) - Diabetic ulcer of toe of right foot associated with type 2 diabetes mellitus, with fat layer exposed ([place])  THERAPY DIAG:  Difficulty in walking   Rationale for Evaluation and Treatment: Rehabilitation  Wound Therapy - 01/22/23 1223     Subjective Pt reports it drained through so had to take it off Saturday.  Reports she did not an appt scheduled last week, however shows she cancelled her appt on Thursday.    Patient and Family Stated Goals wounds to heal    Date of Onset 08/22/22    Prior Treatments self care    Evaluation and Treatment Procedures Explained to Patient/Family Yes    Evaluation and Treatment Procedures agreed to    Wound Properties Date First Assessed: 01/04/23 Time First Assessed: 1355   Wound Image Images linked: 1    Dressing Type None;Compression wrap    Dressing Changed Changed    Dressing Status Clean;Dry    Dressing Change Frequency PRN    Site / Wound Assessment Granulation tissue    % Wound base Red  or Granulating 10%    % Wound base Yellow/Fibrinous Exudate 90%    Peri-wound Assessment Edema;Other (Comment)   callous   Wound Length (cm) 0.5 cm   was 1.2cm   Wound Width (cm) 0.7 cm   was 1cm   Wound Depth (cm) 0.2 cm   was 0.2 cm   Wound Volume (cm^3) 0.07 cm^3    Wound Surface Area (cm^2) 0.35 cm^2    Margins Other (Comment)   thick callous perimeter   Drainage Amount Minimal    Drainage Description Serous    Treatment Cleansed;Debridement (Selective)    Selective Debridement (non-excisional) - Location edges of wound and callous surrounding    Selective Debridement (non-excisional) - Tools Used Forceps;Scissors    Selective Debridement (non-excisional) - Tissue Removed callous, devitalized tissue    Wound Therapy - Clinical Statement see below    Wound Therapy - Functional Problem List unable to wear shoes , difficult with walking    Factors Delaying/Impairing Wound Healing Altered sensation;Diabetes Mellitus;Multiple medical problems;Polypharmacy    Hydrotherapy Plan Debridement;Dressing change;Patient/family education    Wound Therapy - Frequency 2X / week    Wound Therapy - Current Recommendations PT    Wound Plan debride and dress as needed    Dressing  vaseline to perimeter of wound, lotion to LE then vaseline. medihoney on 2X2 to wound, kerlix, coban to knee, #5 netting               PATIENT EDUCATION: Education details: Dressing should not be painful take off if pt has increased pain.  Keep dressing dry take off if it gets wet Person educated: Patient Education method: Explanation Education comprehension: verbalized understanding   HOME EXERCISE PROGRAM: None issued   GOALS: Goals reviewed with patient? Yes  SHORT TERM GOALS: Target date: 01/24/2023  Other 7 wounds beside the main wound to be healed  Baseline: Goal status: IN PROGRESS  2.  Pt to be wearing Darco boot to avoid pressure on wound  Baseline:  Goal status: IN PROGRESS    LONG TERM  GOALS: Target date: 02/15/2023  Wound to have no depth and be no greater than .5x .5cm in size to allow pt to feel comfortable with self care. Baseline:  Goal status: IN PROGRESS  2.  PT pain to be no greater than a 2/10 Baseline:  Goal status: IN PROGRESS    ASSESSMENT:  CLINICAL IMPRESSION: All Abrasions healed on Rt LE.  Noted dryness with discussion of how this causes itchiness, scratching and disruption of skin.  Explained how bacteria fro beneath the fingernails could cause infection and need to prevent this by keeping  her legs moisturized.  Encouraged patient to also use other type dressing besides band aids as they do not promote a healing environment. Pt verbalized understanding. Wound measured and photographed with approximation noted. Debrided perimeter of calloused edges as well as slough from wound bed.  Unable to remove much from wound bed this session as remained adherent.  Still without order received for Lt LE but did not see any open areas besides 2 that pt had covered with band aids.   PT will continue  benefit from skilled PT to achieve a wound healing environment.     OBJECTIVE IMPAIRMENTS: difficulty walking, obesity, pain, and decreased skin integrity .   ACTIVITY LIMITATIONS: dressing, hygiene/grooming, and locomotion level  PARTICIPATION LIMITATIONS: shopping and community activity  PERSONAL FACTORS: Fitness, Time since onset of injury/illness/exacerbation, and 1-2 comorbidities: obesity, DM, Lt transmetatarsal amputation  are also affecting patient's functional outcome.   REHAB POTENTIAL: Good  CLINICAL DECISION MAKING: Stable/uncomplicated  EVALUATION COMPLEXITY: moderate  PLAN: PT FREQUENCY: 2x/week  PT DURATION: 6 weeks  PLANNED INTERVENTIONS: Patient/Family education, Self Care, and debridement and dressing change.  PLAN FOR NEXT SESSION: Continue to monitor wound debride and dress as appropriate.   Teena Irani, PTA/CLT [place] Ph: [phone]   Teena Irani, PTA 01/22/2023, 12:28 PM

## 2023-01-25 ENCOUNTER — Ambulatory Visit (HOSPITAL_COMMUNITY): Payer: BC Managed Care – PPO | Admitting: Physical Therapy

## 2023-01-25 DIAGNOSIS — R262 Difficulty in walking, not elsewhere classified: Secondary | ICD-10-CM | POA: Diagnosis not present

## 2023-01-25 DIAGNOSIS — S91301D Unspecified open wound, right foot, subsequent encounter: Secondary | ICD-10-CM

## 2023-01-25 DIAGNOSIS — E11621 Type 2 diabetes mellitus with foot ulcer: Secondary | ICD-10-CM

## 2023-01-25 NOTE — Therapy (Signed)
OUTPATIENT PHYSICAL THERAPY Wound Treatment   Patient Name: [name] MRN: [id number] DOB:03/23/1966, 57 y.o., female Today's Date: 01/25/2023   PCP: Alvira Monday, [place] REFERRING PROVIDER: Dell Ponto, [name]   END OF SESSION:   PT End of Session - 01/25/23 0856     Visit Number 5    Number of Visits 12    Date for PT Re-Evaluation 02/15/23    Authorization Type BCBS    Progress Note Due on Visit 10    PT Start Time 0820    PT Stop Time 0855    PT Time Calculation (min) 35 min    Activity Tolerance Patient tolerated treatment well    Behavior During Therapy [name] for tasks assessed/performed                 Past Medical History:  Diagnosis Date   Chronic cough    Chronic pain    Diabetes mellitus without complication (HCC)    Dyspnea    GERD (gastroesophageal reflux disease)    Hypokalemia    Hypothyroidism    Left wrist pain 06/01/2021   Myonecrosis ([place]) 07/01/2021   Neurocardiogenic syncope    OSA (obstructive sleep apnea)    does not use CPAP   Osteomyelitis ([place])    bilateral feet   Peripheral neuropathy    Presence of intrathecal pump    recieves Prialt/bupivicaine   Tear of gluteus medius tendon 07/01/2021   Wegener's granulomatosis 2009   Wegner's disease (congenital syphilitic osteochondritis)    Past Surgical History:  Procedure Laterality Date   AMPUTATION Right 10/21/2020   Procedure: Right hallux amputation;  Surgeon: Wylene Simmer, MD;  Location: [place];  Service: Orthopedics;  Laterality: Right;   AMPUTATION TOE Bilateral 08/12/2020   Procedure: Right 3rd toe amputation; left hallux and 3rd toe amputation;  Surgeon: Wylene Simmer, MD;  Location: [place];  Service: Orthopedics;  Laterality: Bilateral;  55min   AMPUTATION TOE Right 03/24/2021   Procedure: AMPUTATION TOE;  Surgeon: Wylene Simmer, MD;  Location: [place];  Service: Orthopedics;  Laterality: Right;   APPENDECTOMY     BACK SURGERY      BIOPSY  03/30/2022   Procedure: BIOPSY;  Surgeon: Laurin Coder, MD;  Location: [place];  Service: Pulmonary;;   BRONCHIAL WASHINGS  03/30/2022   Procedure: BRONCHIAL WASHINGS;  Surgeon: Laurin Coder, MD;  Location: [place];  Service: Pulmonary;;   CHOLECYSTECTOMY     COLONOSCOPY N/A 06/02/2013   Procedure: COLONOSCOPY;  Surgeon: Wonda Horner, MD;  Location: [facility] ENDOSCOPY;  Service: Endoscopy;  Laterality: N/A;   HERNIA REPAIR  2000   INCISION AND DRAINAGE ABSCESS Left 07/07/2016   Procedure: DEBRIDMENT LEFT THIGH ABSCESS, EXISION ACUTE SKIN RASH LEFT THIGH(1CM LESION);  Surgeon: Fanny Skates, MD;  [facility];  Service: General;  Laterality: Left;   IR FLUORO GUIDE CV LINE RIGHT  04/24/2022   IR IVC FILTER PLMT / S&I /IMG GUID/MOD SED  05/17/2022   IR US GUIDE VASC ACCESS RIGHT  04/24/2022   SEPTOPLASTY  02/2013   spleen anuyism     splenic aneurysm     TRANSMETATARSAL AMPUTATION Bilateral 10/21/2020   Procedure: Left transmetatarsal amputation;  Surgeon: Wylene Simmer, MD;  Location: [place];  Service: Orthopedics;  Laterality: Bilateral;   TRANSMETATARSAL AMPUTATION Left 03/24/2021   Procedure: TRANSMETATARSAL AMPUTATION;  Surgeon: Wylene Simmer, MD;  Location: [place];  Service: Orthopedics;  Laterality: Left;   VAGINAL  HYSTERECTOMY     VIDEO BRONCHOSCOPY Right 03/30/2022   Procedure: VIDEO BRONCHOSCOPY WITH FLUORO;  Surgeon: Laurin Coder, MD;  Location: [place] [facility];  Service: Pulmonary;  Laterality: Right;  atypical pneumonia   Patient Active Problem List   Diagnosis Date Noted   Sleep disturbance 01/02/2023   Neuropathic ulcer 12/13/2022   Tinea of groin 09/30/2022   Need for immunization against influenza 09/30/2022   Aortic atherosclerosis 06/29/2022   Type 2 diabetes mellitus 06/29/2022   Acute deep vein thrombosis (DVT) of left lower extremity 05/17/2022   Rhabdomyolysis 04/18/2022   Transaminitis 04/17/2022   Recurrent pneumonia  04/12/2022   Pulmonary embolism 03/28/2022   Severe sepsis 03/26/2022   UTI (urinary tract infection) 03/26/2022   Opioid dependence XX123456   Acute metabolic encephalopathy XX123456   Hip pain 02/20/2022   Arthritis of right hip 02/19/2022   Hypothyroidism    AKI (acute kidney injury)    Class 2 obesity    Depression    Essential hypertension    Acute cystitis without hematuria 01/04/2022   Dysfunction of left rotator cuff 01/04/2022   Lobar pneumonia 01/04/2022   Acquired absence of other toe(s), unspecified side 08/01/2021   Myositis of right lower extremity 07/01/2021   Tear of gluteus medius tendon 07/01/2021   Left wrist pain 06/01/2021   Medication monitoring encounter 05/10/2021   Chronic pain 03/24/2021   COVID-19 virus infection 11/01/2020   Stage 3a chronic kidney disease 09/08/2019   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   Steroid-induced hyperglycemia 05/11/2018   Morbid obesity 01/25/2018   Neuropathy 01/25/2018   Presence of intrathecal pump 08/02/2017   Rectocele 03/09/2017   Abnormal finding on imaging 12/19/2016   Esophageal dysphagia 12/19/2016   Current chronic use of systemic steroids 10/26/2016   IFG (impaired fasting glucose) 10/26/2016   Arthralgia of multiple joints 07/15/2016   Headache 07/15/2016   Peripheral neuropathy 07/04/2016   Rash of body 07/04/2016   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   Neuropathic pain of both feet 01/11/2016   OSA (obstructive sleep apnea) 12/29/2015   Chronic insomnia 12/29/2015   Obesity, Class II, BMI 35-39.9, with comorbidity 12/29/2015   Polyneuropathy associated with underlying disease 12/29/2015   Class 2 drug-induced obesity with serious comorbidity and body mass index (BMI) of 35.0 to 35.9 in adult 12/29/2015   Other mechanical complication of implanted electronic neurostimulator of spinal cord electrode (lead), initial encounter 08/04/2015   Adjustment disorder with depressed mood  03/29/2015   Mononeuritis 07/20/2014   Obesity (BMI 30-39.9) 07/20/2014   Neurocardiogenic syncope 07/06/2014   Normocytic anemia 06/08/2014   POTS (postural orthostatic tachycardia syndrome) 06/08/2014   Encounter for long-term (current) use of high-risk medication 05/30/2013   Fall 05/30/2013   Other long term (current) drug therapy 05/30/2013   Adrenocortical insufficiency 05/30/2013   Glucocorticoid deficiency 05/30/2013   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   REFERRING DIAG: E11.621,L97.512 (ICD-10-CM) - Diabetic ulcer of toe of right foot associated with type 2 diabetes mellitus, with fat layer exposed ([place])  THERAPY DIAG:  Difficulty in walking   Rationale for  Evaluation and Treatment: Rehabilitation     Wound Therapy - 01/25/23 0001     Subjective pt states the dressing stayed up good.  Plans on calling MD again today regarding the Lt LE.  States the small area on her Rt lateral leg is sore and bothers her.    Patient and Family Stated Goals wounds to heal    Date of Onset 08/22/22    Prior Treatments self care    Pain Scale 0-10    Pain Score 0-No pain    Evaluation and Treatment Procedures Explained to Patient/Family Yes    Evaluation and Treatment Procedures agreed to    Wound Properties Date First Assessed: 01/04/23 Time First Assessed: 1355   Dressing Type None;Compression wrap;Impregnated gauze (petrolatum)    Dressing Changed Changed    Dressing Status Clean;Dry    Dressing Change Frequency PRN    Site /  Wound Assessment Granulation tissue    % Wound base Red or Granulating 25%    % Wound base Yellow/Fibrinous Exudate 75%    Peri-wound Assessment Edema;Other (Comment)   callous   Margins Other (Comment)   thick callous perimeter   Drainage Amount Minimal    Drainage Description Serous    Selective Debridement (non-excisional) - Location edges of wound and callous surrounding    Selective Debridement (non-excisional) - Tools Used Scalpel;Forceps    Selective Debridement (non-excisional) - Tissue Removed callous, devitalized tissue    Wound Therapy - Clinical Statement see below    Wound Therapy - Functional Problem List unable to wear shoes , difficult with walking    Factors Delaying/Impairing Wound Healing Altered sensation;Diabetes Mellitus;Multiple medical problems;Polypharmacy    Hydrotherapy Plan Debridement;Dressing change;Patient/family education    Wound Therapy - Frequency 2X / week    Wound Therapy - Current Recommendations PT    Wound Plan debride and dress as needed    Dressing  vaseline to perimeter of wound, lotion to LE then vaseline. xeroform, 2X2 to wound, kerlix, coban to knee, #5 netting               PATIENT EDUCATION: Education details: Dressing should not be painful take off if pt has increased pain.  Keep dressing dry take off if it gets wet Person educated: Patient Education method: Explanation Education comprehension: verbalized understanding   HOME EXERCISE PROGRAM: None issued   GOALS: Goals reviewed with patient? Yes  SHORT TERM GOALS: Target date: 01/24/2023  Other 7 wounds beside the main wound to be healed  Baseline: Goal status: IN PROGRESS  2.  Pt to be wearing Darco boot to avoid pressure on wound  Baseline:  Goal status: IN PROGRESS    LONG TERM GOALS: Target date: 02/15/2023  Wound to have no depth and be no greater than .5x .5cm in size to allow pt to feel comfortable with self care. Baseline:  Goal status: IN PROGRESS  2.   PT pain to be no greater than a 2/10 Baseline:  Goal status: IN PROGRESS    ASSESSMENT:  CLINICAL IMPRESSION: Rt lateral Abrasions superficial without signs/symptoms of infection.  Noted continued approximation of distal foot wound, however continued formation of callous requiring sharps debridement.  Cleansed and moisturized Rt LE well following debridement and prior to re bandaging.  Changed to xeroform this session to see if this helps to soften perimeter.  Also applied xeroform to several other "raw" areas on Rt LE.  Noted some of the wounds on Lt did not have covering on them. Reminded  pt to keep wounds covered to prevent infection.  Still without order received for Lt LE.  PT will continue  benefit from skilled PT to achieve a wound healing environment.     OBJECTIVE IMPAIRMENTS: difficulty walking, obesity, pain, and decreased skin integrity .   ACTIVITY LIMITATIONS: dressing, hygiene/grooming, and locomotion level  PARTICIPATION LIMITATIONS: shopping and community activity  PERSONAL FACTORS: Fitness, Time since onset of injury/illness/exacerbation, and 1-2 comorbidities: obesity, DM, Lt transmetatarsal amputation  are also affecting patient's functional outcome.   REHAB POTENTIAL: Good  CLINICAL DECISION MAKING: Stable/uncomplicated  EVALUATION COMPLEXITY: moderate  PLAN: PT FREQUENCY: 2x/week  PT DURATION: 6 weeks  PLANNED INTERVENTIONS: Patient/Family education, Self Care, and debridement and dressing change.  PLAN FOR NEXT SESSION: Continue to monitor wound debride and dress as appropriate.   Teena Irani, PTA/CLT [place] Ph: [phone]   Teena Irani, PTA 01/25/2023, 8:59 AM

## 2023-01-29 ENCOUNTER — Encounter (HOSPITAL_COMMUNITY): Payer: Self-pay | Admitting: Physical Therapy

## 2023-01-29 ENCOUNTER — Ambulatory Visit (HOSPITAL_COMMUNITY): Payer: BC Managed Care – PPO | Admitting: Physical Therapy

## 2023-01-29 DIAGNOSIS — R262 Difficulty in walking, not elsewhere classified: Secondary | ICD-10-CM

## 2023-01-29 DIAGNOSIS — E11621 Type 2 diabetes mellitus with foot ulcer: Secondary | ICD-10-CM

## 2023-01-29 NOTE — Therapy (Signed)
OUTPATIENT PHYSICAL THERAPY Wound Treatment   Patient Name: Stacey CromerLisa Lapine MRN: [id number] DOB:July 26, 1966, 57 y.o., female Today's Date: 01/29/2023   PCP: Gilmore LarocheZarwolo, Gloria, [name] REFERRING PROVIDER: Aliene AltesZarwolo, [name], Gloria, [name]   END OF SESSION:   PT End of Session - 01/29/23 0900     Visit Number 6    Number of Visits 12    Date for PT Re-Evaluation 02/15/23    Authorization Type [employer]    Progress Note Due on Visit 10    PT Start Time 0904    PT Stop Time 0945    PT Time Calculation (min) 41 min    Activity Tolerance Patient tolerated treatment well    [facility] for tasks assessed/performed                 Past Medical History:  Diagnosis Date   Chronic cough    Chronic pain    Diabetes mellitus without complication    Dyspnea    GERD (gastroesophageal reflux disease)    Hypokalemia    Hypothyroidism    Left wrist pain 06/01/2021   Myonecrosis 07/01/2021   Neurocardiogenic syncope    OSA (obstructive sleep apnea)    does not use CPAP   Osteomyelitis    bilateral feet   Peripheral neuropathy    Presence of intrathecal pump    recieves Prialt/bupivicaine   Tear of gluteus medius tendon 07/01/2021   Wegener's granulomatosis 2009   Wegner's disease (congenital syphilitic osteochondritis)    Past Surgical History:  Procedure Laterality Date   AMPUTATION Right 10/21/2020   Procedure: Right hallux amputation;  Surgeon: Toni ArthursHewitt, John, MD;  Location: [facility];  Service: Orthopedics;  Laterality: Right;   AMPUTATION TOE Bilateral 08/12/2020   Procedure: Right 3rd toe amputation; left hallux and 3rd toe amputation;  Surgeon: Toni ArthursHewitt, John, MD;  Location: [facility];  Service: Orthopedics;  Laterality: Bilateral;  45min   AMPUTATION TOE Right 03/24/2021   Procedure: AMPUTATION TOE;  Surgeon: Toni ArthursHewitt, John, MD;  Location: [facility];  Service: Orthopedics;  Laterality: Right;   APPENDECTOMY     BACK SURGERY     BIOPSY   03/30/2022   Procedure: BIOPSY;  Surgeon: Tomma Lightninglalere, Adewale A, MD;  Location: [facility];  Service: Pulmonary;;   BRONCHIAL WASHINGS  03/30/2022   Procedure: BRONCHIAL WASHINGS;  Surgeon: Tomma Lightninglalere, Adewale A, MD;  Location: [facility];  Service: Pulmonary;;   CHOLECYSTECTOMY     COLONOSCOPY N/A 06/02/2013   Procedure: COLONOSCOPY;  Surgeon: Graylin ShiverSalem F Ganem, MD;  [facility];  Service: Endoscopy;  Laterality: N/A;   HERNIA REPAIR  2000   INCISION AND DRAINAGE ABSCESS Left 07/07/2016   Procedure: DEBRIDMENT LEFT THIGH ABSCESS, EXISION ACUTE SKIN RASH LEFT THIGH(1CM LESION);  Surgeon: Claud KelpHaywood Ingram, MD;  Location: [facility];  Service: General;  Laterality: Left;   IR FLUORO GUIDE CV LINE RIGHT  04/24/2022   IR IVC FILTER PLMT / S&I /IMG GUID/MOD SED  05/17/2022   IR US GUIDE VASC ACCESS RIGHT  04/24/2022   SEPTOPLASTY  02/2013   spleen anuyism     splenic aneurysm     TRANSMETATARSAL AMPUTATION Bilateral 10/21/2020   Procedure: Left transmetatarsal amputation;  Surgeon: Toni ArthursHewitt, John, MD;  Location: [facility];  Service: Orthopedics;  Laterality: Bilateral;   TRANSMETATARSAL AMPUTATION Left 03/24/2021   Procedure: TRANSMETATARSAL AMPUTATION;  Surgeon: Toni ArthursHewitt, John, MD;  [facility];  Service: Orthopedics;  Laterality: Left;   VAGINAL HYSTERECTOMY  VIDEO BRONCHOSCOPY Right 03/30/2022   Procedure: VIDEO BRONCHOSCOPY WITH FLUORO;  Surgeon: Tomma Lightning, MD;  [facility]: [facility];  Service: Pulmonary;  Laterality: Right;  atypical pneumonia   Patient Active Problem List   Diagnosis Date Noted   Sleep disturbance 01/02/2023   Neuropathic ulcer 12/13/2022   Tinea of groin 09/30/2022   Need for immunization against influenza 09/30/2022   Aortic atherosclerosis 06/29/2022   Type 2 diabetes mellitus 06/29/2022   Acute deep vein thrombosis (DVT) of left lower extremity 05/17/2022   Rhabdomyolysis 04/18/2022   Transaminitis 04/17/2022   Recurrent pneumonia 04/12/2022    Pulmonary embolism 03/28/2022   Severe sepsis 03/26/2022   UTI (urinary tract infection) 03/26/2022   Opioid dependence 03/26/2022   Acute metabolic encephalopathy 03/26/2022   Hip pain 02/20/2022   Arthritis of right hip 02/19/2022   Hypothyroidism    AKI (acute kidney injury)    Class 2 obesity    Depression    Essential hypertension    Acute cystitis without hematuria 01/04/2022   Dysfunction of left rotator cuff 01/04/2022   Lobar pneumonia 01/04/2022   Acquired absence of other toe(s), unspecified side 08/01/2021   Myositis of right lower extremity 07/01/2021   Tear of gluteus medius tendon 07/01/2021   Left wrist pain 06/01/2021   Medication monitoring encounter 05/10/2021   Chronic pain 03/24/2021   COVID-19 virus infection 11/01/2020   Stage 3a chronic kidney disease 09/08/2019   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   Steroid-induced hyperglycemia 05/11/2018   Morbid obesity 01/25/2018   Neuropathy 01/25/2018   Presence of intrathecal pump 08/02/2017   Rectocele 03/09/2017   Abnormal finding on imaging 12/19/2016   Esophageal dysphagia 12/19/2016   Current chronic use of systemic steroids 10/26/2016   IFG (impaired fasting glucose) 10/26/2016   Arthralgia of multiple joints 07/15/2016   Headache 07/15/2016   Peripheral neuropathy 07/04/2016   Rash of body 07/04/2016   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   Neuropathic pain of both feet 01/11/2016   OSA (obstructive sleep apnea) 12/29/2015   Chronic insomnia 12/29/2015   Obesity, Class II, BMI 35-39.9, with comorbidity 12/29/2015   Polyneuropathy associated with underlying disease 12/29/2015   Class 2 drug-induced obesity with serious comorbidity and body mass index (BMI) of 35.0 to 35.9 in adult 12/29/2015   Other mechanical complication of implanted electronic neurostimulator of spinal cord electrode (lead), initial encounter 08/04/2015   Adjustment disorder with depressed mood 03/29/2015    Mononeuritis 07/20/2014   Obesity (BMI 30-39.9) 07/20/2014   Neurocardiogenic syncope 07/06/2014   Normocytic anemia 06/08/2014   POTS (postural orthostatic tachycardia syndrome) 06/08/2014   Encounter for long-term (current) use of high-risk medication 05/30/2013   Fall 05/30/2013   Other long term (current) drug therapy 05/30/2013   Adrenocortical insufficiency 05/30/2013   Glucocorticoid deficiency 05/30/2013   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   REFERRING DIAG: E11.621,L97.512 (ICD-10-CM) - Diabetic ulcer of toe of right foot associated with type 2 diabetes mellitus, with fat layer exposed (HCC)  THERAPY DIAG:  Difficulty in walking   Rationale for Evaluation and Treatment: Rehabilitation  Wound Therapy - 01/29/23 0001     Subjective she has a couple wounds on LLE.    Patient and Family Stated Goals wounds to heal    Date of Onset 08/22/22    Prior Treatments self care    Pain Score 0-No pain    Evaluation and Treatment Procedures Explained to Patient/Family Yes    Evaluation and Treatment Procedures agreed to    Wound Properties Date First Assessed: 01/04/23 Time First Assessed: 1355   Dressing Type None;Compression wrap;Impregnated gauze (petrolatum)    Dressing Status Clean;Dry    Dressing Change Frequency PRN    Site / Wound Assessment Granulation tissue    % Wound base Red or Granulating 25%    % Wound base Yellow/Fibrinous Exudate 75%    Peri-wound Assessment Edema;Other (Comment)   callous   Margins  Other (Comment)   thick callous perimeter   Drainage Amount Minimal    Drainage Description Serous    Treatment Cleansed;Debridement (Selective)    Selective Debridement (non-excisional) - Location edges of wound and callous surrounding    Selective Debridement (non-excisional) - Tools Used Scalpel;Forceps    Selective Debridement (non-excisional) - Tissue Removed callous, devitalized tissue    Wound Therapy - Clinical Statement see below    Wound Therapy - Functional Problem List unable to wear shoes , difficult with walking    Factors Delaying/Impairing Wound Healing Altered sensation;Diabetes Mellitus;Multiple medical problems;Polypharmacy    Hydrotherapy Plan Debridement;Dressing change;Patient/family education    Wound Therapy - Frequency 2X / week    Wound Therapy - Current Recommendations PT    Wound Plan debride and dress as needed    Dressing  vaseline to perimeter of wound, lotion to LE then vaseline. xeroform, 2X2 to wound, kerlix, coban to knee, #5 netting               PATIENT EDUCATION: Education details: Dressing should not be painful take off if pt has increased pain.  Keep dressing dry take off if it gets wet Person educated: Patient Education method: Explanation Education comprehension: verbalized understanding   HOME EXERCISE PROGRAM: None issued   GOALS: Goals reviewed with patient? Yes  SHORT TERM GOALS: Target date: 01/24/2023  Other 7 wounds beside the main wound to be healed  Baseline: Goal status: IN PROGRESS  2.  Pt to be wearing Darco boot to avoid pressure on wound  Baseline:  Goal status: IN PROGRESS    LONG TERM GOALS: Target date: 02/15/2023  Wound to have no depth and be no greater than .5x .5cm in size to allow pt to feel comfortable with self care. Baseline:  Goal status: IN PROGRESS  2.  PT pain to be no greater than a 2/10 Baseline:  Goal status: IN PROGRESS    ASSESSMENT:  CLINICAL IMPRESSION: Most wounds on RLE healed  or very small scabs remaining. Continued with debridement of callus and wound margins which is tolerated well but some remaining. Continued with xeroform to wound beds followed by compression wrap. Patient will continue to benefit from PT to promote wound healing.    OBJECTIVE IMPAIRMENTS: difficulty walking, obesity, pain, and decreased skin integrity .   ACTIVITY LIMITATIONS: dressing, hygiene/grooming, and locomotion level  PARTICIPATION LIMITATIONS: shopping and community activity  PERSONAL FACTORS: Fitness, Time since onset of injury/illness/exacerbation, and 1-2 comorbidities: obesity, DM, Lt transmetatarsal amputation  are also affecting patient's functional outcome.   REHAB POTENTIAL: Good  CLINICAL DECISION MAKING: Stable/uncomplicated  EVALUATION COMPLEXITY: moderate  PLAN: PT FREQUENCY: 2x/week  PT DURATION: 6 weeks  PLANNED INTERVENTIONS: Patient/Family education, Self Care, and debridement and dressing change.  PLAN FOR NEXT SESSION: Continue to monitor wound debride and dress as appropriate.      Reola Mosher Luke Rigsbee, PT 01/29/2023, 9:51 AM

## 2023-01-31 ENCOUNTER — Encounter: Payer: Self-pay | Admitting: Family Medicine

## 2023-01-31 ENCOUNTER — Ambulatory Visit: Payer: BC Managed Care – PPO | Admitting: Family Medicine

## 2023-01-31 VITALS — BP 111/74 | HR 84 | Ht 64.0 in | Wt 239.0 lb

## 2023-01-31 DIAGNOSIS — R1310 Dysphagia, unspecified: Secondary | ICD-10-CM

## 2023-01-31 DIAGNOSIS — R600 Localized edema: Secondary | ICD-10-CM | POA: Diagnosis not present

## 2023-01-31 DIAGNOSIS — L98492 Non-pressure chronic ulcer of skin of other sites with fat layer exposed: Secondary | ICD-10-CM

## 2023-01-31 MED ORDER — DOXYCYCLINE HYCLATE 100 MG PO TABS
100.0000 mg | ORAL_TABLET | Freq: Two times a day (BID) | ORAL | 0 refills | Status: AC
Start: 2023-01-31 — End: 2023-02-07

## 2023-01-31 MED ORDER — HYDROCHLOROTHIAZIDE 12.5 MG PO TABS
12.5000 mg | ORAL_TABLET | Freq: Every day | ORAL | 0 refills | Status: DC
Start: 2023-01-31 — End: 2023-05-04

## 2023-01-31 NOTE — Assessment & Plan Note (Signed)
Bilateral peripheral edema noted We will treat with hydrochlorothiazide 12.5 mg daily for 2 weeks Encourage leg elevation and avoidance of high sodium food Patient verbalized understanding

## 2023-01-31 NOTE — Assessment & Plan Note (Signed)
Will treat today with doxycycline 100 mg twice daily for 7 days Referral placed to [facility] outpatient rehabitation center for wound care of the left lower extremity No systematic symptoms reported

## 2023-01-31 NOTE — Assessment & Plan Note (Addendum)
Complains of pain with swallowing on the left side Onset symptom for a week Occurs mostly with solids She does report a sensation of food being stuck in her throat sometimes Reports regurgitation and denies residual food remaining of her pharynx Complaining of sometimes coughing and choking during food consumption History of heartburn We will place a referral into GI for further evaluation of the patient's symptoms

## 2023-01-31 NOTE — Progress Notes (Signed)
Established Patient Office Visit  Subjective:  Patient ID: Stacey CromerLisa [name], female    DOB: [birth year]  Age: 57 y.o. MRN: [id number]  CC:  Chief Complaint  Patient presents with   Leg Swelling    Bilateral leg swelling x 2 weeks ( 01/17/2023)     HPI Stacey CromerLisa [name] is a 57 y.o. female presents with complaints of painful swallowing and bilateral lower extremity swelling. For the details of today's visit, please refer to the assessment and plan.     Past Medical History:  Diagnosis Date   Chronic cough    Chronic pain    Diabetes mellitus without complication    Dyspnea    GERD (gastroesophageal reflux disease)    Hypokalemia    Hypothyroidism    Left wrist pain 06/01/2021   Myonecrosis 07/01/2021   Neurocardiogenic syncope    OSA (obstructive sleep apnea)    does not use CPAP   Osteomyelitis    bilateral feet   Peripheral neuropathy    Presence of intrathecal pump    recieves Prialt/bupivicaine   Tear of gluteus medius tendon 07/01/2021   Wegener's granulomatosis 2009   Wegner's disease (congenital syphilitic osteochondritis)     Past Surgical History:  Procedure Laterality Date   AMPUTATION Right 10/21/2020   Procedure: Right hallux amputation;  Surgeon: Toni ArthursHewitt, John, MD;  Location: [facility];  Service: Orthopedics;  Laterality: Right;   AMPUTATION TOE Bilateral 08/12/2020   Procedure: Right 3rd toe amputation; left hallux and 3rd toe amputation;  Surgeon: Toni ArthursHewitt, John, MD;  Location: [facility];  Service: Orthopedics;  Laterality: Bilateral;  45min   AMPUTATION TOE Right 03/24/2021   Procedure: AMPUTATION TOE;  Surgeon: Toni ArthursHewitt, John, MD;  Location: [facility];  Service: Orthopedics;  Laterality: Right;   APPENDECTOMY     BACK SURGERY     BIOPSY  03/30/2022   Procedure: BIOPSY;  Surgeon: Tomma Lightninglalere, Adewale A, MD;  Location: [facility];  Service: Pulmonary;;   BRONCHIAL WASHINGS  03/30/2022   Procedure: BRONCHIAL WASHINGS;  Surgeon: Tomma Lightninglalere, Adewale A, MD;   Location: [facility];  Service: Pulmonary;;   CHOLECYSTECTOMY     COLONOSCOPY N/A 06/02/2013   Procedure: COLONOSCOPY;  Surgeon: Graylin ShiverSalem F Ganem, MD;  [facility];  Service: Endoscopy;  Laterality: N/A;   HERNIA REPAIR  2000   INCISION AND DRAINAGE ABSCESS Left 07/07/2016   Procedure: DEBRIDMENT LEFT THIGH ABSCESS, EXISION ACUTE SKIN RASH LEFT THIGH(1CM LESION);  Surgeon: Claud KelpHaywood Ingram, MD;  Location: [facility];  Service: General;  Laterality: Left;   IR FLUORO GUIDE CV LINE RIGHT  04/24/2022   IR IVC FILTER PLMT / S&I /IMG GUID/MOD SED  05/17/2022   IR US GUIDE VASC ACCESS RIGHT  04/24/2022   SEPTOPLASTY  02/2013   spleen anuyism     splenic aneurysm     TRANSMETATARSAL AMPUTATION Bilateral 10/21/2020   Procedure: Left transmetatarsal amputation;  Surgeon: Toni ArthursHewitt, John, MD;  Location: [facility];  Service: Orthopedics;  Laterality: Bilateral;   TRANSMETATARSAL AMPUTATION Left 03/24/2021   Procedure: TRANSMETATARSAL AMPUTATION;  Surgeon: Toni ArthursHewitt, John, MD;  [facility];  Service: Orthopedics;  Laterality: Left;   VAGINAL HYSTERECTOMY     VIDEO BRONCHOSCOPY Right 03/30/2022   Procedure: VIDEO BRONCHOSCOPY WITH FLUORO;  Surgeon: Tomma Lightninglalere, Adewale A, MD;  Location: [facility];  Service: Pulmonary;  Laterality: Right;  atypical pneumonia    Family History  Problem Relation Age of Onset   Diabetes Mother    Heart disease Mother  Diabetes Father     Social History   Socioeconomic History   Marital status: Married    Spouse name: Not on file   Number of children: Not on file   Years of education: Not on file   Highest education level: Not on file  Occupational History   Not on file  Tobacco Use   Smoking status: Never   Smokeless tobacco: Never  Vaping Use   Vaping Use: Never used  Substance and Sexual Activity   Alcohol use: Yes    Comment: occasionally   Drug use: No   Sexual activity: Yes    Birth control/protection: Surgical  Other Topics Concern    Not on file  Social History Narrative   Not on file   Social Determinants of Health   Financial Resource Strain: Not on file  Food Insecurity: Not on file  Transportation Needs: Not on file  Physical Activity: Not on file  Stress: Not on file  Social Connections: Not on file  Intimate Partner Violence: Not on file    Outpatient Medications Prior to Visit  Medication Sig Dispense Refill   acetaminophen (TYLENOL) 325 MG tablet Take 2 tablets (650 mg total) by mouth every 6 (six) hours as needed for mild pain (or Fever >/= 101).     albuterol (VENTOLIN HFA) 108 (90 Base) MCG/ACT inhaler Inhale 2 puffs into the lungs every 6 (six) hours as needed for wheezing or shortness of breath. 1 each 11   amitriptyline (ELAVIL) 50 MG tablet TAKE 1 TABLET BY MOUTH AT BEDTIME FOR SLEEP. 30 tablet 0   Calcium Carb-Cholecalciferol (CALCIUM CARBONATE-VITAMIN D3 PO) Take 1 tablet by mouth daily.     clotrimazole (ANTIFUNGAL CLOTRIMAZOLE) 1 % cream Apply 1 Application topically 2 (two) times daily. 30 g 0   guaiFENesin (MUCINEX) 600 MG 12 hr tablet Take 1 tablet (600 mg total) by mouth 2 (two) times daily. 60 tablet 3   HYDROcodone-acetaminophen (NORCO/VICODIN) 5-325 MG tablet Take 1 tablet by mouth 2 (two) times daily as needed.     levothyroxine (SYNTHROID) 25 MCG tablet Take 1 tablet (25 mcg total) by mouth daily. 30 tablet 0   lidocaine (LIDODERM) 5 % Place 1 patch onto the skin daily as needed (for pain). Remove & Discard patch within 12 hours or as directed by MD     LINZESS 145 MCG CAPS capsule Take one capsule (145 mcg dose) by mouth daily. 30 capsule 0   magnesium oxide (MAG-OX) 400 MG tablet Take 400 mg by mouth daily.     metFORMIN (GLUCOPHAGE) 500 MG tablet Take 1 tablet (500 mg total) by mouth 2 (two) times daily with a meal. 180 tablet 3   methocarbamol (ROBAXIN) 500 MG tablet Take 1 tablet (500 mg total) by mouth every 6 (six) hours as needed for muscle spasms. 30 tablet 0   MIEBO 1.338 GM/ML  SOLN Apply to eye.     naloxone (NARCAN) nasal spray 4 mg/0.1 mL Place 0.4 mg into the nose once.     pantoprazole (PROTONIX) 40 MG tablet TAKE (1) TABLET BY MOUTH ONCE DAILY. 30 tablet 0   promethazine-dextromethorphan (PROMETHAZINE-DM) 6.25-15 MG/5ML syrup Take 5 mLs by mouth 4 (four) times daily as needed for cough. 118 mL 0   senna (SENOKOT) 8.6 MG TABS tablet Take 2 tablets (17.2 mg total) by mouth 2 (two) times daily. 30 tablet 0   topiramate (TOPAMAX) 100 MG tablet TAKE (1) TABLET BY MOUTH ONCE DAILY. 30 tablet 0  triamcinolone cream (KENALOG) 0.1 % Apply 1 Application topically 2 (two) times daily. 30 g 0   Vitamin D, Ergocalciferol, (DRISDOL) 1.25 MG (50000 UNIT) CAPS capsule Take 1 capsule (50,000 Units total) by mouth every Friday. 7 capsule 3   predniSONE (DELTASONE) 5 MG tablet Take 1 tablet (5 mg total) by mouth daily. 30 tablet 1   rosuvastatin (CRESTOR) 10 MG tablet TAKE (1) TABLET BY MOUTH AT BEDTIME. 30 tablet 2   No facility-administered medications prior to visit.    Allergies  Allergen Reactions   Ibuprofen Other (See Comments)    Pt is unable to take this due to kidney problems.      Penicillins Rash and Other (See Comments)    Has patient had a PCN reaction causing immediate rash, facial/tongue/throat swelling, SOB or lightheadedness with hypotension: No Has patient had a PCN reaction causing severe rash involving mucus membranes or skin necrosis: No Has patient had a PCN reaction that required hospitalization No Has patient had a PCN reaction occurring within the last 10 years: No If all of the above answers are "NO", then may proceed with Cephalosporin use.    ROS Review of Systems  Constitutional:  Negative for chills and fever.  Eyes:  Negative for visual disturbance.  Respiratory:  Negative for chest tightness and shortness of breath.   Cardiovascular:  Positive for leg swelling.  Skin:  Positive for wound.  Neurological:  Negative for dizziness and  headaches.      Objective:    Physical Exam HENT:     Head: Normocephalic.     Mouth/Throat:     Mouth: Mucous membranes are moist.     Comments: No nodules palpated of the thyroid gland No enlargement of the thyroid gland visualized Cardiovascular:     Rate and Rhythm: Normal rate.     Heart sounds: Normal heart sounds.  Pulmonary:     Effort: Pulmonary effort is normal.     Breath sounds: Normal breath sounds.  Musculoskeletal:     Right lower leg: Edema present.     Left lower leg: Edema present.  Skin:    Findings: Lesion (Multiple ulcers or purulent discharge noted on the left lower extremity) present.  Neurological:     Mental Status: She is alert.     BP 111/74   Pulse 84   Ht 5\' 4"  (1.626 m)   Wt 239 lb (108.4 kg)   SpO2 91%   BMI 41.02 kg/m  Wt Readings from Last 3 Encounters:  01/31/23 239 lb (108.4 kg)  01/02/23 229 lb 1.3 oz (103.9 kg)  12/13/22 225 lb (102.1 kg)    Lab Results  Component Value Date   TSH 4.210 01/04/2023   Lab Results  Component Value Date   WBC 11.9 (H) 01/04/2023   HGB 12.7 01/04/2023   HCT 39.6 01/04/2023   MCV 93 01/04/2023   PLT 269 01/04/2023   Lab Results  Component Value Date   NA 141 01/04/2023   K 4.5 01/04/2023   CO2 20 01/04/2023   GLUCOSE 137 (H) 01/04/2023   BUN 14 01/04/2023   CREATININE 1.28 (H) 01/04/2023   BILITOT 0.3 01/04/2023   ALKPHOS 116 01/04/2023   AST 23 01/04/2023   ALT 26 01/04/2023   PROT 5.9 (L) 01/04/2023   ALBUMIN 4.1 01/04/2023   CALCIUM 9.2 01/04/2023   ANIONGAP 13 05/18/2022   EGFR 49 (L) 01/04/2023   Lab Results  Component Value Date   CHOL 163 01/04/2023  Lab Results  Component Value Date   HDL 42 01/04/2023   Lab Results  Component Value Date   LDLCALC 69 01/04/2023   Lab Results  Component Value Date   TRIG 328 (H) 01/04/2023   Lab Results  Component Value Date   CHOLHDL 3.9 01/04/2023   Lab Results  Component Value Date   HGBA1C 7.6 (H) 01/04/2023       Assessment & Plan:  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  Dispense: 14 tablet; Refill: 0  Odynophagia Assessment & Plan: Complains of pain with swallowing on the left side Onset symptom for a week Occurs mostly with solids She does report a sensation of food being stuck in her throat sometimes Reports regurgitation and denies residual food remaining of her pharynx Complaining of sometimes coughing and choking during food consumption History of heartburn We will place a referral into GI for further evaluation of the patient's symptoms  Orders: -     Ambulatory referral to Gastroenterology  Edema, peripheral Assessment & Plan: Bilateral peripheral edema noted We will treat with hydrochlorothiazide 12.5 mg daily for 2 weeks Encourage leg elevation and avoidance of high sodium food Patient verbalized understanding  Orders: -     hydroCHLOROthiazide; Take 1 tablet (12.5 mg total) by mouth daily for 14 days.  Dispense: 14 tablet; Refill: 0   Note: This chart has been completed using Engineer, civil (consulting) software, and while attempts have been made to ensure accuracy, certain words and phrases may not be transcribed as intended.    Follow-up: Return in about 5 days (around 02/05/2023).   [name], [name]

## 2023-01-31 NOTE — Patient Instructions (Addendum)
I appreciate the opportunity to provide care to you today!    Follow up:  02/05/2023  Please pick up your prescription at the pharmacy  Referrals today-  Wound Care  and GI   Please continue to a heart-healthy diet and increase your physical activities. Try to exercise for at least five days a week.      It was a pleasure to see you and I look forward to continuing to work together on your health and well-being. Please do not hesitate to call the office if you need care or have questions about your care.   Have a wonderful day and week. With Gratitude, [name] MSN, [name]-BC

## 2023-02-01 ENCOUNTER — Ambulatory Visit (HOSPITAL_COMMUNITY): Payer: BC Managed Care – PPO | Admitting: Physical Therapy

## 2023-02-01 DIAGNOSIS — S91301D Unspecified open wound, right foot, subsequent encounter: Secondary | ICD-10-CM

## 2023-02-01 DIAGNOSIS — S91302A Unspecified open wound, left foot, initial encounter: Secondary | ICD-10-CM

## 2023-02-01 DIAGNOSIS — R262 Difficulty in walking, not elsewhere classified: Secondary | ICD-10-CM

## 2023-02-01 DIAGNOSIS — S81802D Unspecified open wound, left lower leg, subsequent encounter: Secondary | ICD-10-CM

## 2023-02-01 DIAGNOSIS — E11621 Type 2 diabetes mellitus with foot ulcer: Secondary | ICD-10-CM

## 2023-02-01 NOTE — Therapy (Signed)
OUTPATIENT PHYSICAL THERAPY Wound Treatment   Patient Name: [name] MRN: [id number] DOB:[birth year], 57 y.o., female Today's Date: 02/01/2023   PCP: Gilmore LarocheZarwolo, Gloria, [name] REFERRING PROVIDER: Aliene AltesZarwolo, [name], Gloria, [name]   END OF SESSION:   PT End of Session - 02/01/23 1120     Visit Number 7    Number of Visits 12    Date for PT Re-Evaluation 02/15/23    Authorization Type [employer]    Progress Note Due on Visit 10    PT Start Time 0900    PT Stop Time 0945    PT Time Calculation (min) 45 min    Activity Tolerance Patient tolerated treatment well    [facility] tasks assessed/performed                 Past Medical History:  Diagnosis Date   Chronic cough    Chronic pain    Diabetes mellitus without complication    Dyspnea    GERD (gastroesophageal reflux disease)    Hypokalemia    Hypothyroidism    Left wrist pain 06/01/2021   Myonecrosis 07/01/2021   Neurocardiogenic syncope    OSA (obstructive sleep apnea)    does not use CPAP   Osteomyelitis    bilateral feet   Peripheral neuropathy    Presence of intrathecal pump    recieves Prialt/bupivicaine   Tear of gluteus medius tendon 07/01/2021   Wegener's granulomatosis 2009   Wegner's disease (congenital syphilitic osteochondritis)    Past Surgical History:  Procedure Laterality Date   AMPUTATION Right 10/21/2020   Procedure: Right hallux amputation;  Surgeon: Toni ArthursHewitt, John, MD;  Location: [facility];  Service: Orthopedics;  Laterality: Right;   AMPUTATION TOE Bilateral 08/12/2020   Procedure: Right 3rd toe amputation; left hallux and 3rd toe amputation;  Surgeon: Toni ArthursHewitt, John, MD;  Location: [facility];  Service: Orthopedics;  Laterality: Bilateral;  45min   AMPUTATION TOE Right 03/24/2021   Procedure: AMPUTATION TOE;  Surgeon: Toni ArthursHewitt, John, MD;  Location: [facility];  Service: Orthopedics;  Laterality: Right;   APPENDECTOMY     BACK SURGERY     BIOPSY   03/30/2022   Procedure: BIOPSY;  Surgeon: Tomma Lightninglalere, Adewale A, MD;  Location: [facility];  Service: Pulmonary;;   BRONCHIAL WASHINGS  03/30/2022   Procedure: BRONCHIAL WASHINGS;  Surgeon: Tomma Lightninglalere, Adewale A, MD;  Location: [facility];  Service: Pulmonary;;   CHOLECYSTECTOMY     COLONOSCOPY N/A 06/02/2013   Procedure: COLONOSCOPY;  Surgeon: Graylin ShiverSalem F Ganem, MD;  [facility];  Service: Endoscopy;  Laterality: N/A;   HERNIA REPAIR  2000   INCISION AND DRAINAGE ABSCESS Left 07/07/2016   Procedure: DEBRIDMENT LEFT THIGH ABSCESS, EXISION ACUTE SKIN RASH LEFT THIGH(1CM LESION);  Surgeon: Claud KelpHaywood Ingram, MD;  Location: [facility];  Service: General;  Laterality: Left;   IR FLUORO GUIDE CV LINE RIGHT  04/24/2022   IR IVC FILTER PLMT / S&I /IMG GUID/MOD SED  05/17/2022   IR US GUIDE VASC ACCESS RIGHT  04/24/2022   SEPTOPLASTY  02/2013   spleen anuyism     splenic aneurysm     TRANSMETATARSAL AMPUTATION Bilateral 10/21/2020   Procedure: Left transmetatarsal amputation;  Surgeon: Toni ArthursHewitt, John, MD;  Location:  [facility];  Service: Orthopedics;  Laterality: Bilateral;   TRANSMETATARSAL AMPUTATION Left 03/24/2021   Procedure: TRANSMETATARSAL AMPUTATION;  Surgeon: Toni ArthursHewitt, John, MD;  [facility];  Service: Orthopedics;  Laterality: Left;   VAGINAL HYSTERECTOMY  VIDEO BRONCHOSCOPY Right 03/30/2022   Procedure: VIDEO BRONCHOSCOPY WITH FLUORO;  Surgeon: Tomma Lightning, MD;  [facility]: [facility];  Service: Pulmonary;  Laterality: Right;  atypical pneumonia   Patient Active Problem List   Diagnosis Date Noted   Odynophagia 01/31/2023   Edema, peripheral 01/31/2023   Sleep disturbance 01/02/2023   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 12/13/2022   Tinea of groin 09/30/2022   Need for immunization against influenza 09/30/2022   Aortic atherosclerosis 06/29/2022   Type 2 diabetes mellitus 06/29/2022   Acute deep vein thrombosis (DVT) of left lower extremity 05/17/2022    Rhabdomyolysis 04/18/2022   Transaminitis 04/17/2022   Recurrent pneumonia 04/12/2022   Pulmonary embolism 03/28/2022   Severe sepsis 03/26/2022   UTI (urinary tract infection) 03/26/2022   Opioid dependence 03/26/2022   Acute metabolic encephalopathy 03/26/2022   Hip pain 02/20/2022   Arthritis of right hip 02/19/2022   Hypothyroidism    AKI (acute kidney injury)    Class 2 obesity    Depression    Essential hypertension    Acute cystitis without hematuria 01/04/2022   Dysfunction of left rotator cuff 01/04/2022   Lobar pneumonia 01/04/2022   Acquired absence of other toe(s), unspecified side 08/01/2021   Myositis of right lower extremity 07/01/2021   Tear of gluteus medius tendon 07/01/2021   Left wrist pain 06/01/2021   Medication monitoring encounter 05/10/2021   Chronic pain 03/24/2021   COVID-19 virus infection 11/01/2020   Stage 3a chronic kidney disease 09/08/2019   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   Steroid-induced hyperglycemia 05/11/2018   Morbid obesity 01/25/2018   Neuropathy 01/25/2018   Presence of intrathecal pump 08/02/2017   Rectocele 03/09/2017   Abnormal finding on imaging 12/19/2016   Esophageal dysphagia 12/19/2016   Current chronic use of systemic steroids 10/26/2016   IFG (impaired fasting glucose) 10/26/2016   Arthralgia of multiple joints 07/15/2016   Headache 07/15/2016   Peripheral neuropathy 07/04/2016   Rash of body 07/04/2016   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   Neuropathic pain of both feet 01/11/2016   OSA (obstructive sleep apnea) 12/29/2015   Chronic insomnia 12/29/2015   Obesity, Class II, BMI 35-39.9, with comorbidity 12/29/2015   Polyneuropathy associated with underlying disease 12/29/2015   Class 2 drug-induced obesity with serious comorbidity and body mass index (BMI) of 35.0 to 35.9 in adult 12/29/2015   Other mechanical complication of implanted electronic neurostimulator of spinal cord electrode  (lead), initial encounter 08/04/2015   Adjustment disorder with depressed mood 03/29/2015   Mononeuritis 07/20/2014   Obesity (BMI 30-39.9) 07/20/2014   Neurocardiogenic syncope 07/06/2014   Normocytic anemia 06/08/2014   POTS (postural orthostatic tachycardia syndrome) 06/08/2014   Encounter for long-term (current) use of high-risk medication 05/30/2013   Fall 05/30/2013   Other long term (current) drug therapy 05/30/2013   Adrenocortical insufficiency 05/30/2013   Glucocorticoid deficiency 05/30/2013   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   REFERRING DIAG: E11.621,L97.512 (ICD-10-CM) - Diabetic ulcer of toe of right foot associated with type 2 diabetes mellitus, with fat layer exposed (HCC)  THERAPY DIAG:  Difficulty in walking   Rationale for Evaluation and Treatment: Rehabilitation     Wound Therapy - 02/01/23 0001     Subjective PT comes to therapy with a new order to treat wounds on her Lt LE as well, states she also has a wound on her hip area, however there is no order for this    Patient and Family Stated Goals wounds to heal    Date of Onset 08/22/22    Prior Treatments self care    Pain Scale 0-10    Pain Score 8     Pain Type Chronic pain    Pain Location Leg    Pain Orientation Left;Lower    Pain Descriptors / Indicators Burning    Pain Onset On-going    Patients Stated Pain Goal 0    Pain Intervention(s) Emotional support    Evaluation and Treatment Procedures Explained to Patient/Family Yes     Evaluation and Treatment Procedures agreed to    Wound Properties Date First Assessed: 02/01/23 Time First Assessed: 0920 Wound Type: Non-pressure wound Location: Leg Location Orientation: Left;Lower Wound Description (Comments): over 10 wounds including one on pt heal spread throughout LE.  These were present when pt initially came but there was not order for treatment. Present on Admission: Yes   Wound Image --   Please see second picture from below images   Dressing Type None    Dressing Changed New    Dressing Status None    Dressing Change Frequency PRN    Site / Wound Assessment Dry;Painful;Red    % Wound base Red or Granulating --   varies dependent on the wound some are 100% red others 40% granulated 60% eschar.   % Wound base Yellow/Fibrinous Exudate --   as above   Peri-wound Assessment Edema    Wound Length (cm) --   varies in size to many to monitor   Drainage Amount None    Treatment Cleansed;Debridement (Selective)    Wound Properties Date First Assessed: 01/04/23 Time First Assessed: 1355   Wound Image Images linked: 3    Dressing Type None;Compression wrap;Impregnated gauze (petrolatum)    Dressing Status Clean;Dry    Dressing Change Frequency PRN    Site / Wound Assessment Granulation tissue    % Wound base Red or Granulating 90%    % Wound base Yellow/Fibrinous Exudate 10%    Peri-wound Assessment Intact   callous   Margins Other (Comment)   thick callous perimeter   Drainage Amount Minimal    Drainage Description Serous    Selective Debridement (non-excisional) - Location edges of wound and callous surrounding    Selective Debridement (non-excisional) - Tools Used Forceps;Scissors    Selective Debridement (non-excisional) - Tissue Removed callous, devitalized tissue    Wound Therapy - Clinical Statement see below    Wound Therapy - Functional Problem List unable to wear shoes , difficult with walking    Factors Delaying/Impairing Wound Healing Altered  sensation;Diabetes Mellitus;Multiple medical problems;Polypharmacy    Hydrotherapy Plan Debridement;Dressing change;Patient/family education    Wound Therapy - Frequency 2X / week    Wound Therapy - Current Recommendations PT    Wound Plan debride and dress as needed    Dressing  vaseline to perimeter of wound, lotion to LE then vaseline. xeroform, 2X2 to wound, kerlix, coban to knee, #5 netting               PATIENT EDUCATION: Education details: Dressing should not be painful take off  if pt has increased pain.  Keep dressing dry take off if it gets wet Person educated: Patient Education method: Explanation Education comprehension: verbalized understanding   HOME EXERCISE PROGRAM: None issued   GOALS: Goals reviewed with patient? Yes  SHORT TERM GOALS: Target date: 01/24/2023  Other 7 wounds beside the main wound to be healed  Baseline: Goal status: IN PROGRESS  2.  Pt to be wearing Darco boot to avoid pressure on wound  Baseline:  Goal status: IN PROGRESS    LONG TERM GOALS: Target date: 02/15/2023  Wound to have no depth and be no greater than .5x .5cm in size to allow pt to feel comfortable with self care. Baseline:  Goal status: IN PROGRESS  2.  PT pain to be no greater than a 2/10 Baseline:  Goal status: IN PROGRESS    ASSESSMENT:  CLINICAL IMPRESSION: PT comes to department with order to treat her Lt LE.  Rt Le only has one wound left and that is on the plantar aspect of her foot.  Lt LE has 10+ wounds with varying % or granulation.  Pt will continue to benefit from skilled PT to create a healing environment to decrease the risk of cellulitis and decrease pt pain.    OBJECTIVE IMPAIRMENTS: difficulty walking, obesity, pain, and decreased skin integrity .   ACTIVITY LIMITATIONS: dressing, hygiene/grooming, and locomotion level  PARTICIPATION LIMITATIONS: shopping and community activity  PERSONAL FACTORS: Fitness, Time since onset of  injury/illness/exacerbation, and 1-2 comorbidities: obesity, DM, Lt transmetatarsal amputation  are also affecting patient's functional outcome.   REHAB POTENTIAL: Good  CLINICAL DECISION MAKING: Stable/uncomplicated  EVALUATION COMPLEXITY: moderate  PLAN: PT FREQUENCY: 2x/week  PT DURATION: 6 weeks  PLANNED INTERVENTIONS: Patient/Family education, Self Care, and debridement and dressing change.  PLAN FOR NEXT SESSION: Continue to monitor wound debride and dress as appropriate.     Virgina Organ, PT CLT [phone]  02/01/2023, 11:30 AM

## 2023-02-02 ENCOUNTER — Other Ambulatory Visit: Payer: Self-pay | Admitting: Family Medicine

## 2023-02-02 DIAGNOSIS — G479 Sleep disorder, unspecified: Secondary | ICD-10-CM

## 2023-02-05 ENCOUNTER — Ambulatory Visit: Payer: BC Managed Care – PPO | Admitting: Family Medicine

## 2023-02-05 ENCOUNTER — Other Ambulatory Visit (HOSPITAL_COMMUNITY)
Admission: RE | Admit: 2023-02-05 | Discharge: 2023-02-05 | Disposition: A | Discharge status: Home / Self Care | Payer: BC Managed Care – PPO | Source: Ambulatory Visit

## 2023-02-05 ENCOUNTER — Ambulatory Visit (HOSPITAL_COMMUNITY): Payer: BC Managed Care – PPO | Admitting: Physical Therapy

## 2023-02-05 ENCOUNTER — Encounter (HOSPITAL_COMMUNITY): Payer: Self-pay | Admitting: Physical Therapy

## 2023-02-05 ENCOUNTER — Encounter: Payer: Self-pay | Admitting: Family Medicine

## 2023-02-05 DIAGNOSIS — R262 Difficulty in walking, not elsewhere classified: Secondary | ICD-10-CM | POA: Diagnosis not present

## 2023-02-05 DIAGNOSIS — E11621 Type 2 diabetes mellitus with foot ulcer: Secondary | ICD-10-CM

## 2023-02-05 DIAGNOSIS — G609 Hereditary and idiopathic neuropathy, unspecified: Secondary | ICD-10-CM

## 2023-02-05 DIAGNOSIS — Z124 Encounter for screening for malignant neoplasm of cervix: Secondary | ICD-10-CM

## 2023-02-05 DIAGNOSIS — S91301D Unspecified open wound, right foot, subsequent encounter: Secondary | ICD-10-CM

## 2023-02-05 DIAGNOSIS — S91302A Unspecified open wound, left foot, initial encounter: Secondary | ICD-10-CM

## 2023-02-05 DIAGNOSIS — S81802D Unspecified open wound, left lower leg, subsequent encounter: Secondary | ICD-10-CM

## 2023-02-05 MED ORDER — GABAPENTIN 300 MG PO CAPS
300.0000 mg | ORAL_CAPSULE | Freq: Every day | ORAL | 3 refills | Status: DC
Start: 2023-02-05 — End: 2023-05-30

## 2023-02-05 NOTE — Progress Notes (Signed)
Established Patient Office Visit  Subjective:  Patient ID: [name], female    DOB: Nov 05, 1965  Age: 57 y.o. MRN: [id number]  CC:  Chief Complaint  Patient presents with   Gynecologic Exam    Pap and breast exam   Follow-up    Pt reports hurting in her joints, knees, hips and shoulders aching all the time, noticed it more over the weekend 02/03/2023.    HPI [name] is a 57 y.o. female presents for a Pap smear examination.  Neuropathy: She complains of joint pains in her hips, shoulders, and knees. Of note, the patient has degenerative changes in her hips and was recently recently diagnosed with avascular necrosis.  She reports increase pain in her joints since her gabapentin was discontinued.  Pain is worse at nighttime and with ambulation.  She rates her pain 10 out of 10.   Past Medical History:  Diagnosis Date   Chronic cough    Chronic pain    Diabetes mellitus without complication    Dyspnea    GERD (gastroesophageal reflux disease)    Hypokalemia    Hypothyroidism    Left wrist pain 06/01/2021   Myonecrosis 07/01/2021   Neurocardiogenic syncope    OSA (obstructive sleep apnea)    does not use CPAP   Osteomyelitis    bilateral feet   Peripheral neuropathy    Presence of intrathecal pump    recieves Prialt/bupivicaine   Tear of gluteus medius tendon 07/01/2021   Wegener's granulomatosis 2009   Wegner's disease (congenital syphilitic osteochondritis)     Past Surgical History:  Procedure Laterality Date   AMPUTATION Right 10/21/2020   Procedure: Right hallux amputation;  Surgeon: Toni Arthurs, MD;  Location: [facility];  Service: Orthopedics;  Laterality: Right;   AMPUTATION TOE Bilateral 08/12/2020   Procedure: Right 3rd toe amputation; left hallux and 3rd toe amputation;  Surgeon: Toni Arthurs, MD;  Location: [facility];  Service: Orthopedics;  Laterality: Bilateral;    AMPUTATION TOE Right 03/24/2021   Procedure: AMPUTATION  TOE;  Surgeon: Toni Arthurs, MD;  Location: [facility];  Service: Orthopedics;  Laterality: Right;   APPENDECTOMY     BACK SURGERY     BIOPSY  03/30/2022   Procedure: BIOPSY;  Surgeon: Tomma Lightning, MD;  Location: [facility];  Service: Pulmonary;;   BRONCHIAL WASHINGS  03/30/2022   Procedure: BRONCHIAL WASHINGS;  Surgeon: Tomma Lightning, MD;  Location: [facility];  Service: Pulmonary;;   CHOLECYSTECTOMY     COLONOSCOPY N/A 06/02/2013   Procedure: COLONOSCOPY;  Surgeon: Graylin Shiver, MD;  [facility];  Service: Endoscopy;  Laterality: N/A;   HERNIA REPAIR  2000   INCISION AND DRAINAGE ABSCESS Left 07/07/2016   Procedure: DEBRIDMENT LEFT THIGH ABSCESS, EXISION ACUTE SKIN RASH LEFT THIGH(1CM LESION);  Surgeon: Claud Kelp, MD;  Location: [facility];  Service: General;  Laterality: Left;   IR FLUORO GUIDE CV LINE RIGHT  04/24/2022   IR IVC FILTER PLMT / S&I /IMG GUID/MOD SED  05/17/2022   IR US GUIDE VASC ACCESS RIGHT  04/24/2022   SEPTOPLASTY  02/2013   spleen anuyism     splenic aneurysm     TRANSMETATARSAL AMPUTATION Bilateral 10/21/2020   Procedure: Left transmetatarsal amputation;  Surgeon: Toni Arthurs, MD;  Location: [facility];  Service: Orthopedics;  Laterality: Bilateral;   TRANSMETATARSAL AMPUTATION Left 03/24/2021   Procedure: TRANSMETATARSAL AMPUTATION;  Surgeon: Toni Arthurs, MD;  Location: [facility];  Service: Orthopedics;  Laterality: Left;   VAGINAL HYSTERECTOMY     VIDEO BRONCHOSCOPY Right 03/30/2022   Procedure: VIDEO BRONCHOSCOPY WITH FLUORO;  Surgeon: Tomma Lightning, MD;  Location: [facility];  Service: Pulmonary;  Laterality: Right;  atypical pneumonia    Family History  Problem Relation Age of Onset   Diabetes Mother    Heart disease Mother    Diabetes Father     Social History   Socioeconomic History   Marital status: Married    Spouse name: Not on file   Number of children: Not on file   Years of education: Not on file   Highest  education level: Not on file  Occupational History   Not on file  Tobacco Use   Smoking status: Never   Smokeless tobacco: Never  Vaping Use   Vaping Use: Never used  Substance and Sexual Activity   Alcohol use: Yes    Comment: occasionally   Drug use: No   Sexual activity: Yes    Birth control/protection: Surgical  Other Topics Concern   Not on file  Social History Narrative   Not on file   Social Determinants of Health   Financial Resource Strain: Not on file  Food Insecurity: Not on file  Transportation Needs: Not on file  Physical Activity: Not on file  Stress: Not on file  Social Connections: Not on file  Intimate Partner Violence: Not on file    Outpatient Medications Prior to Visit  Medication Sig Dispense Refill   acetaminophen (TYLENOL) 325 MG tablet Take 2 tablets (650 mg total) by mouth every 6 (six) hours as needed for mild pain (or Fever >/= 101).     albuterol (VENTOLIN HFA) 108 (90 Base) MCG/ACT inhaler Inhale 2 puffs into the lungs every 6 (six) hours as needed for wheezing or shortness of breath. 1 each 11   amitriptyline (ELAVIL) 50 MG tablet TAKE 1 TABLET BY MOUTH AT BEDTIME FOR SLEEP. 30 tablet 0   Calcium Carb-Cholecalciferol (CALCIUM CARBONATE-VITAMIN D3 PO) Take 1 tablet by mouth daily.     clotrimazole (ANTIFUNGAL CLOTRIMAZOLE) 1 % cream Apply 1 Application topically 2 (two) times daily. 30 g 0   doxycycline (VIBRA-TABS) 100 MG tablet Take 1 tablet (100 mg total) by mouth 2 (two) times daily for 7 days. 14 tablet 0   guaiFENesin (MUCINEX) 600 MG 12 hr tablet Take 1 tablet (600 mg total) by mouth 2 (two) times daily. 60 tablet 3   hydrochlorothiazide (HYDRODIURIL) 12.5 MG tablet Take 1 tablet (12.5 mg total) by mouth daily for 14 days. 14 tablet 0   HYDROcodone-acetaminophen (NORCO/VICODIN) 5-325 MG tablet Take 1 tablet by mouth 2 (two) times daily as needed.     levothyroxine (SYNTHROID) 25 MCG tablet Take 1 tablet (25 mcg total) by mouth daily. 30  tablet 0   lidocaine (LIDODERM) 5 % Place 1 patch onto the skin daily as needed (for pain). Remove & Discard patch within 12 hours or as directed by MD     LINZESS 145 MCG CAPS capsule Take one capsule (145 mcg dose) by mouth daily. 30 capsule 0   magnesium oxide (MAG-OX) 400 MG tablet Take 400 mg by mouth daily.     metFORMIN (GLUCOPHAGE) 500 MG tablet Take 1 tablet (500 mg total) by mouth 2 (two) times daily with a meal. 180 tablet 3   methocarbamol (ROBAXIN) 500 MG tablet Take 1 tablet (500 mg total) by mouth every 6 (six) hours as needed for muscle spasms.  30 tablet 0   MIEBO 1.338 GM/ML SOLN Apply to eye.     naloxone (NARCAN) nasal spray 4 mg/0.1 mL Place 0.4 mg into the nose once.     pantoprazole (PROTONIX) 40 MG tablet TAKE (1) TABLET BY MOUTH ONCE DAILY. 30 tablet 0   promethazine-dextromethorphan (PROMETHAZINE-DM) 6.25-15 MG/5ML syrup Take 5 mLs by mouth 4 (four) times daily as needed for cough. 118 mL 0   senna (SENOKOT) 8.6 MG TABS tablet Take 2 tablets (17.2 mg total) by mouth 2 (two) times daily. 30 tablet 0   topiramate (TOPAMAX) 100 MG tablet TAKE (1) TABLET BY MOUTH ONCE DAILY. 30 tablet 0   triamcinolone cream (KENALOG) 0.1 % Apply 1 Application topically 2 (two) times daily. 30 g 0   Vitamin D, Ergocalciferol, (DRISDOL) 1.25 MG (50000 UNIT) CAPS capsule Take 1 capsule (50,000 Units total) by mouth every Friday. 7 capsule 3   predniSONE (DELTASONE) 5 MG tablet Take 1 tablet (5 mg total) by mouth daily. 30 tablet 1   rosuvastatin (CRESTOR) 10 MG tablet TAKE (1) TABLET BY MOUTH AT BEDTIME. 30 tablet 2   No facility-administered medications prior to visit.    Allergies  Allergen Reactions   Ibuprofen Other (See Comments)    Pt is unable to take this due to kidney problems.      Penicillins Rash and Other (See Comments)    Has patient had a PCN reaction causing immediate rash, facial/tongue/throat swelling, SOB or lightheadedness with hypotension: No Has patient had a PCN  reaction causing severe rash involving mucus membranes or skin necrosis: No Has patient had a PCN reaction that required hospitalization No Has patient had a PCN reaction occurring within the last 10 years: No If all of the above answers are "NO", then may proceed with Cephalosporin use.    ROS Review of Systems  Constitutional:  Negative for chills and fever.  Eyes:  Negative for visual disturbance.  Respiratory:  Negative for chest tightness and shortness of breath.   Musculoskeletal:  Positive for arthralgias.  Neurological:  Negative for dizziness and headaches.      Objective:    Physical Exam HENT:     Head: Normocephalic.     Mouth/Throat:     Mouth: Mucous membranes are moist.  Cardiovascular:     Rate and Rhythm: Normal rate.     Heart sounds: Normal heart sounds.  Pulmonary:     Effort: Pulmonary effort is normal.     Breath sounds: Normal breath sounds.  Genitourinary:    Tanner stage (genital): 5.     Labia:        Right: No rash, tenderness, lesion or injury.        Left: No rash, tenderness, lesion or injury.      Comments: Vaginal wall: pink and rugated, smooth and non-tender; absence of lesions, edema, and erythema. Labia Majora and Minora: present bilaterally, moist, soft tissue, and homogeneous; free of edema and ulcerations. Clitoris is anatomically present, above the urethral, and free of lesions, masses, and ulceration.  Neurological:     Mental Status: She is alert.     There were no vitals taken for this visit. Wt Readings from Last 3 Encounters:  01/31/23 239 lb (108.4 kg)  01/02/23 229 lb 1.3 oz (103.9 kg)  12/13/22 225 lb (102.1 kg)    Lab Results  Component Value Date   TSH 4.210 01/04/2023   Lab Results  Component Value Date   WBC 11.9 (H) 01/04/2023  HGB 12.7 01/04/2023   HCT 39.6 01/04/2023   MCV 93 01/04/2023   PLT 269 01/04/2023   Lab Results  Component Value Date   NA 141 01/04/2023   K 4.5 01/04/2023   CO2 20  01/04/2023   GLUCOSE 137 (H) 01/04/2023   BUN 14 01/04/2023   CREATININE 1.28 (H) 01/04/2023   BILITOT 0.3 01/04/2023   ALKPHOS 116 01/04/2023   AST 23 01/04/2023   ALT 26 01/04/2023   PROT 5.9 (L) 01/04/2023   ALBUMIN 4.1 01/04/2023   CALCIUM 9.2 01/04/2023   ANIONGAP 13 05/18/2022   EGFR 49 (L) 01/04/2023   Lab Results  Component Value Date   CHOL 163 01/04/2023   Lab Results  Component Value Date   HDL 42 01/04/2023   Lab Results  Component Value Date   LDLCALC 69 01/04/2023   Lab Results  Component Value Date   TRIG 328 (H) 01/04/2023   Lab Results  Component Value Date   CHOLHDL 3.9 01/04/2023   Lab Results  Component Value Date   HGBA1C 7.6 (H) 01/04/2023      Assessment & Plan:  Cervical cancer screening Assessment & Plan: -Cytology and HPV co-testing (preferred) every 5 years or cytology alone (acceptable) every 3 years. - Pap due 2029    Orders: -     Cytology - PAP  Idiopathic peripheral neuropathy Assessment & Plan: Will reinstate gabapentin 300 mg nightly and follow-up in 3 weeks Encouraged to continue taking amitriptyline 75 mg nightly and duloxetine 60 mg Encouraged to continue to follow-up with orthopedics as scheduled for worsening of shoulder and knee pain   Orders: -     Gabapentin; Take 1 capsule (300 mg total) by mouth at bedtime.  Dispense: 90 capsule; Refill: 3    Follow-up: Return in about 3 weeks (around 02/26/2023).   [name], [name]

## 2023-02-05 NOTE — Progress Notes (Signed)
.  pap

## 2023-02-05 NOTE — Assessment & Plan Note (Addendum)
Will reinstate gabapentin 300 mg nightly and follow-up in 3 weeks Encouraged to continue taking amitriptyline 75 mg nightly and duloxetine 60 mg Encouraged to continue to follow-up with orthopedics as scheduled for worsening of shoulder and knee pain

## 2023-02-05 NOTE — Patient Instructions (Addendum)
I appreciate the opportunity to provide care to you today!    Follow up: 3 weeks  We will let you know the results of your Pap examination on MyChart  Will give you a call by the end of today or tomorrow with the plan of care of your pain medication   Please continue to a heart-healthy diet and increase your physical activities. Try to exercise for at least five days a week.      It was a pleasure to see you and I look forward to continuing to work together on your health and well-being. Please do not hesitate to call the office if you need care or have questions about your care.   Have a wonderful day and week. With Gratitude, [name] MSN, [name]-BC

## 2023-02-05 NOTE — Therapy (Signed)
OUTPATIENT PHYSICAL THERAPY Wound Treatment   Patient Name: [name] MRN: [id number] DOB:02/08/1966, 57 y.o., female Today's Date: 02/05/2023   PCP: [name], [name] REFERRING PROVIDER: [name], [name]   END OF SESSION:   PT End of Session - 02/05/23 0732     Visit Number 8    Number of Visits 12    Date for PT Re-Evaluation 02/15/23    Authorization Type [employer]    Progress Note Due on Visit 10    PT Start Time 0731    PT Stop Time 0822    PT Time Calculation (min) 51 min    Activity Tolerance Patient tolerated treatment well    Behavior During Therapy [facility] for tasks assessed/performed                 Past Medical History:  Diagnosis Date   Chronic cough    Chronic pain    Diabetes mellitus without complication    Dyspnea    GERD (gastroesophageal reflux disease)    Hypokalemia    Hypothyroidism    Left wrist pain 06/01/2021   Myonecrosis 07/01/2021   Neurocardiogenic syncope    OSA (obstructive sleep apnea)    does not use CPAP   Osteomyelitis    bilateral feet   Peripheral neuropathy    Presence of intrathecal pump    recieves Prialt/bupivicaine   Tear of gluteus medius tendon 07/01/2021   Wegener's granulomatosis 2009   Wegner's disease (congenital syphilitic osteochondritis)    Past Surgical History:  Procedure Laterality Date   AMPUTATION Right 10/21/2020   Procedure: Right hallux amputation;  Surgeon: Toni Arthurs, MD;  Location: [facility];  Service: Orthopedics;  Laterality: Right;   AMPUTATION TOE Bilateral 08/12/2020   Procedure: Right 3rd toe amputation; left hallux and 3rd toe amputation;  Surgeon: Toni Arthurs, MD;  Location: [facility];  Service: Orthopedics;  Laterality: Bilateral;    AMPUTATION TOE Right 03/24/2021   Procedure: AMPUTATION TOE;  Surgeon: Toni Arthurs, MD;  Location: [facility];  Service: Orthopedics;  Laterality: Right;   APPENDECTOMY     BACK SURGERY     BIOPSY   03/30/2022   Procedure: BIOPSY;  Surgeon: Tomma Lightning, MD;  Location: [facility];  Service: Pulmonary;;   BRONCHIAL WASHINGS  03/30/2022   Procedure: BRONCHIAL WASHINGS;  Surgeon: Tomma Lightning, MD;  Location: [facility];  Service: Pulmonary;;   CHOLECYSTECTOMY     COLONOSCOPY N/A 06/02/2013   Procedure: COLONOSCOPY;  Surgeon: Graylin Shiver, MD;  Location: [facility];  Service: Endoscopy;  Laterality: N/A;   HERNIA REPAIR  2000   INCISION AND DRAINAGE ABSCESS Left 07/07/2016   Procedure: DEBRIDMENT LEFT THIGH ABSCESS, EXISION ACUTE SKIN RASH LEFT THIGH(1CM LESION);  Surgeon: Claud Kelp, MD;  Location: [facility];  Service: General;  Laterality: Left;   IR FLUORO GUIDE CV LINE RIGHT  04/24/2022   IR IVC FILTER PLMT / S&I /IMG GUID/MOD SED  05/17/2022   IR US GUIDE VASC ACCESS RIGHT  04/24/2022   SEPTOPLASTY  02/2013   spleen anuyism     splenic aneurysm     TRANSMETATARSAL AMPUTATION Bilateral 10/21/2020   Procedure: Left transmetatarsal amputation;  Surgeon: Toni Arthurs, MD;  Location: [facility];  Service: Orthopedics;  Laterality: Bilateral;   TRANSMETATARSAL AMPUTATION Left 03/24/2021   Procedure: TRANSMETATARSAL AMPUTATION;  Surgeon: Toni Arthurs, MD;  Location: [facility];  Service: Orthopedics;  Laterality: Left;   VAGINAL HYSTERECTOMY  VIDEO BRONCHOSCOPY Right 03/30/2022   Procedure: VIDEO BRONCHOSCOPY WITH FLUORO;  Surgeon: Tomma Lightning, MD;  Location: [facility];  Service: Pulmonary;  Laterality: Right;  atypical pneumonia   Patient Active Problem List   Diagnosis Date Noted   Odynophagia 01/31/2023   Edema, peripheral 01/31/2023   Sleep disturbance 01/02/2023   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 12/13/2022   Tinea of groin 09/30/2022   Need for immunization against influenza 09/30/2022   Aortic atherosclerosis 06/29/2022   Type 2 diabetes mellitus 06/29/2022   Acute deep vein thrombosis (DVT) of left lower extremity 05/17/2022    Rhabdomyolysis 04/18/2022   Transaminitis 04/17/2022   Recurrent pneumonia 04/12/2022   Pulmonary embolism 03/28/2022   Severe sepsis 03/26/2022   UTI (urinary tract infection) 03/26/2022   Opioid dependence 03/26/2022   Acute metabolic encephalopathy 03/26/2022   Hip pain 02/20/2022   Arthritis of right hip 02/19/2022   Hypothyroidism    AKI (acute kidney injury)    Class 2 obesity    Depression    Essential hypertension    Acute cystitis without hematuria 01/04/2022   Dysfunction of left rotator cuff 01/04/2022   Lobar pneumonia 01/04/2022   Acquired absence of other toe(s), unspecified side 08/01/2021   Myositis of right lower extremity 07/01/2021   Tear of gluteus medius tendon 07/01/2021   Left wrist pain 06/01/2021   Medication monitoring encounter 05/10/2021   Chronic pain 03/24/2021   COVID-19 virus infection 11/01/2020   Stage 3a chronic kidney disease 09/08/2019   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   Steroid-induced hyperglycemia 05/11/2018   Morbid obesity 01/25/2018   Neuropathy 01/25/2018   Presence of intrathecal pump 08/02/2017   Rectocele 03/09/2017   Abnormal finding on imaging 12/19/2016   Esophageal dysphagia 12/19/2016   Current chronic use of systemic steroids 10/26/2016   IFG (impaired fasting glucose) 10/26/2016   Arthralgia of multiple joints 07/15/2016   Headache 07/15/2016   Peripheral neuropathy 07/04/2016   Rash of body 07/04/2016   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   Neuropathic pain of both feet 01/11/2016   OSA (obstructive sleep apnea) 12/29/2015   Chronic insomnia 12/29/2015   Obesity, Class II, BMI 35-39.9, with comorbidity 12/29/2015   Polyneuropathy associated with underlying disease 12/29/2015   Class 2 drug-induced obesity with serious comorbidity and body mass index (BMI) of 35.0 to 35.9 in adult 12/29/2015   Other mechanical complication of implanted electronic neurostimulator of spinal cord electrode  (lead), initial encounter 08/04/2015   Adjustment disorder with depressed mood 03/29/2015   Mononeuritis 07/20/2014   Obesity (BMI 30-39.9) 07/20/2014   Neurocardiogenic syncope 07/06/2014   Normocytic anemia 06/08/2014   POTS (postural orthostatic tachycardia syndrome) 06/08/2014   Encounter for long-term (current) use of high-risk medication 05/30/2013   Fall 05/30/2013   Other long term (current) drug therapy 05/30/2013   Adrenocortical insufficiency 05/30/2013   Glucocorticoid deficiency 05/30/2013   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   REFERRING DIAG: E11.621,L97.512 (ICD-10-CM) - Diabetic ulcer of toe of right foot associated with type 2 diabetes mellitus, with fat layer exposed (HCC)  THERAPY DIAG:  Difficulty in walking   Rationale for Evaluation and Treatment: Rehabilitation     Wound Therapy - 02/05/23 0001     Subjective Patient states her legs are painful at night.    Patient and Family Stated Goals wounds to heal    Date of Onset 08/22/22    Prior Treatments self care    Pain Score 0-No pain    Evaluation and Treatment Procedures Explained to Patient/Family Yes    Evaluation and Treatment Procedures agreed to    Wound Properties Date First Assessed: 02/01/23 Time First Assessed: 0920 Wound Type: Non-pressure wound Location: Leg Location Orientation: Left;Lower Wound Description (Comments): over 10 wounds including one on pt heal spread throughout LE.  These were present when pt initially came but there was not  order for treatment. Present on Admission: Yes   Dressing Type Compression wrap    Dressing Changed Changed    Dressing Status None    Dressing Change Frequency PRN    Site / Wound Assessment Dry;Painful;Red    Peri-wound Assessment Edema    Drainage Amount Scant    Treatment Cleansed;Debridement (Selective)    Wound Properties Date First Assessed: 01/04/23 Time First Assessed: 1355   Dressing Type None;Compression wrap;Impregnated gauze (petrolatum)    Dressing Status Clean;Dry    Dressing Change Frequency PRN    Site / Wound Assessment Granulation tissue    % Wound base Red or Granulating 90%    % Wound base Yellow/Fibrinous Exudate 10%    Peri-wound Assessment Intact   callous   Margins Other (Comment)   thick callous perimeter   Drainage Amount Minimal    Drainage Description Serous    Treatment Cleansed;Debridement (Selective)    Selective Debridement (non-excisional) - Location edges of wound and callous surrounding, wound beds    Selective Debridement (non-excisional) - Tools Used Forceps;Scissors    Selective Debridement (non-excisional) - Tissue Removed callous, devitalized tissue    Wound Therapy - Clinical Statement see below    Wound Therapy - Functional Problem List unable to wear shoes , difficult with walking    Factors Delaying/Impairing Wound Healing Altered sensation;Diabetes Mellitus;Multiple medical problems;Polypharmacy    Hydrotherapy Plan Debridement;Dressing change;Patient/family education    Wound Therapy - Frequency 2X / week    Wound Therapy - Current Recommendations PT    Wound Plan debride and dress as needed    Dressing  vaseline to perimeter of wound, lotion to LE then vaseline. xeroform, 2X2 to wound, kerlix, coban to knee, #5 netting               PATIENT EDUCATION: Education details: Dressing should not be painful take off if pt has increased pain.  Keep dressing dry take off if it gets wet Person educated: Patient Education method:  Explanation Education comprehension: verbalized understanding   HOME EXERCISE PROGRAM: None issued   GOALS: Goals reviewed with patient? Yes  SHORT TERM GOALS: Target date: 01/24/2023  Other 7 wounds beside the main wound to be healed  Baseline: Goal status: IN PROGRESS  2.  Pt to be wearing Darco boot to avoid pressure on wound  Baseline:  Goal status: IN PROGRESS    LONG TERM GOALS: Target date: 02/15/2023  Wound to have no depth and be no greater than .5x .5cm in size to allow pt to feel comfortable with self care. Baseline:  Goal status: IN PROGRESS  2.  PT pain to be no greater than a 2/10 Baseline:  Goal status: IN PROGRESS  ASSESSMENT:  CLINICAL IMPRESSION: Patient with numerous wounds on LLE and one at distal foot of RLE. Debrided slough, dry skin, and callous from wounds and periwounds. LLE wounds all red/granulated following debridement. R foot wound with most of slough removed. Dressed all wounds with xeroform followed by compression wrap. Patient will continue from PT to promote wound healing.     OBJECTIVE IMPAIRMENTS: difficulty walking, obesity, pain, and decreased skin integrity .   ACTIVITY LIMITATIONS: dressing, hygiene/grooming, and locomotion level  PARTICIPATION LIMITATIONS: shopping and community activity  PERSONAL FACTORS: Fitness, Time since onset of injury/illness/exacerbation, and 1-2 comorbidities: obesity, DM, Lt transmetatarsal amputation  are also affecting patient's functional outcome.   REHAB POTENTIAL: Good  CLINICAL DECISION MAKING: Stable/uncomplicated  EVALUATION COMPLEXITY: moderate  PLAN: PT FREQUENCY: 2x/week  PT DURATION: 6 weeks  PLANNED INTERVENTIONS: Patient/Family education, Self Care, and debridement and dressing change.  PLAN FOR NEXT SESSION: Continue to monitor wound debride and dress as appropriate.     8:28 AM, 02/05/23 Wyman Songster PT, DPT Physical Therapist at [facility]

## 2023-02-05 NOTE — Assessment & Plan Note (Signed)
Cytology and HPV co-testing (preferred) every 5 years or cytology alone (acceptable) every 3 years. - Pap due 2029   

## 2023-02-06 NOTE — Progress Notes (Signed)
GI Office Note    Referring Provider: [name], [name] Primary Care Physician:  [name], [name]  Primary Gastroenterologist: Gerrit Friends.Rourk, MD  Chief Complaint   Chief Complaint  Patient presents with   New Patient (Initial Visit)    Pt referred for trouble swallowing and throat pain   History of Present Illness   [name] is a 57 y.o. female presenting today at the request of [name], [name] for odynophagia and dysphagia.  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  Mild oropharyngeal dysphagia per modified barium swallow.  Mildly delayed oral transit of bolus.  Not able to maintain bolus with 13 mm tablet and consistent with thin liquids and then with pure solids.  She had to spit out tablet both times.  No impairment observed with pured solids, thin liquids, or nectar thick liquids.  She exhibited silent aspiration during swallow with thin liquids a trace amount.  Recommend to follow a dysphagia 3/mechanical soft diet and nectar thick liquids.  Eat sitting upright.  CT A/P July 2023: -Slight interval increase in size of known right gluteal and piriformis intramuscular hematoma -Colonic diverticulosis without diverticulitis -Mild subpleural groundglass and nodular opacities in bilateral lung bases -Diffuse body wall edema  Chest x-ray October 2023: Continued improvement in right lung opacities from previous exam, now resolved.  No new airspace disease.  Labs 01/18/2023: Creatinine 1.47, GFR 41, potassium 5, alk phos 120, AST 41, ALT 42, WBC 13.9, hemoglobin 13, platelets 316  Last colonoscopy in August 2014 at [facility]: -Internal hemorrhoids -Exam otherwise normal  Echo with EF 55% in June 2023.   Today: Dysphagia - Has lots of coughing due to chronic respiratory infections.  Dysphagia has been going on for quite some time. Had some issues last summer with swallowing as well but also significant infections at that time as well. At times even water is regurgitating. Mostly occurs with solids but at times it occurs with liquids. No trouble swallowing medications. At times larger capsules feel like they get stuck. Reports history of an EGD about 10 years or more ago and does report needing esophagus stretched [facility]). GERD not as well controlled with pantoprazole. Symptoms have been occuring more frequently. Needs Tums everyday. Typically takes Protonix at night.   Shortness of breath is baseline, not worsened right now. Has to frequently cough up sputum. Has some baseline intermittent chest pain. Has been off Eliquis for a few months. Vascular took her off the anticoagulation. Uses nebulizer as needed. No home oxygen.   Taking fiber right now to help with constipation. Every other day BM. Uses Linzess as needed. Has intermittent diarrhea.   No melena. Has hemorrhoids and has intermittent toilet tissue hematochezia from this. Likely grade 2 hemorrhoids.  Denies any lack of appetite or early satiety.  Current Outpatient Medications  Medication Sig Dispense Refill   acetaminophen (TYLENOL) 325 MG tablet Take 2 tablets (650 mg total) by mouth every 6 (six) hours as needed for mild pain (or Fever >/= 101).     albuterol (VENTOLIN HFA) 108 (90 Base) MCG/ACT inhaler Inhale 2 puffs into the lungs every 6 (six) hours as needed for wheezing or shortness of breath. 1 each 11   amitriptyline (ELAVIL) 50 MG tablet TAKE 1 TABLET BY MOUTH AT BEDTIME FOR SLEEP. 30 tablet 0   Calcium  Carb-Cholecalciferol (CALCIUM CARBONATE-VITAMIN D3 PO) Take 1 tablet by mouth daily.     clotrimazole (ANTIFUNGAL CLOTRIMAZOLE) 1 % cream Apply 1 Application topically 2 (two) times daily. 30 g 0   doxycycline (VIBRA-TABS) 100 MG tablet Take 1 tablet (100 mg total) by mouth 2 (two) times daily for 7 days.  14 tablet 0   gabapentin (NEURONTIN) 300 MG capsule Take 1 capsule (300 mg total) by mouth at bedtime. 90 capsule 3   hydrochlorothiazide (HYDRODIURIL) 12.5 MG tablet Take 1 tablet (12.5 mg total) by mouth daily for 14 days. 14 tablet 0   levothyroxine (SYNTHROID) 25 MCG tablet Take 1 tablet (25 mcg total) by mouth daily. 30 tablet 0   lidocaine (LIDODERM) 5 % Place 1 patch onto the skin daily as needed (for pain). Remove & Discard patch within 12 hours or as directed by MD     LINZESS 145 MCG CAPS capsule Take one capsule (145 mcg dose) by mouth daily. 30 capsule 0   magnesium oxide (MAG-OX) 400 MG tablet Take 400 mg by mouth daily.     metFORMIN (GLUCOPHAGE) 500 MG tablet Take 1 tablet (500 mg total) by mouth 2 (two) times daily with a meal. 180 tablet 3   methocarbamol (ROBAXIN) 500 MG tablet Take 1 tablet (500 mg total) by mouth every 6 (six) hours as needed for muscle spasms. 30 tablet 0   MIEBO 1.338 GM/ML SOLN Apply to eye.     naloxone (NARCAN) nasal spray 4 mg/0.1 mL Place 0.4 mg into the nose once.     senna (SENOKOT) 8.6 MG TABS tablet Take 2 tablets (17.2 mg total) by mouth 2 (two) times daily. 30 tablet 0   topiramate (TOPAMAX) 100 MG tablet TAKE (1) TABLET BY MOUTH ONCE DAILY. 30 tablet 0   Vitamin D, Ergocalciferol, (DRISDOL) 1.25 MG (50000 UNIT) CAPS capsule Take 1 capsule (50,000 Units total) by mouth every Friday. 7 capsule 3   HYDROcodone-acetaminophen (NORCO/VICODIN) 5-325 MG tablet Take 1 tablet by mouth 2 (two) times daily as needed. (Patient not taking: Reported on 02/07/2023)     pantoprazole (PROTONIX) 40 MG tablet Take 1 tablet (40 mg total) by mouth 2 (two) times daily before a meal. 30 tablet 0   promethazine-dextromethorphan (PROMETHAZINE-DM) 6.25-15 MG/5ML syrup Take 5 mLs by mouth 4 (four) times daily as needed for cough. (Patient not taking: Reported on 02/07/2023) 118 mL 0   No current facility-administered medications for this visit.    Past Medical History:   Diagnosis Date   Chronic cough    Chronic pain    Diabetes mellitus without complication    Dyspnea    GERD (gastroesophageal reflux disease)    Hypokalemia    Hypothyroidism    Left wrist pain 06/01/2021   Myonecrosis 07/01/2021   Neurocardiogenic syncope    OSA (obstructive sleep apnea)    does not use CPAP   Osteomyelitis    bilateral feet   Peripheral neuropathy    Presence of intrathecal pump    recieves Prialt/bupivicaine   Tear of gluteus medius tendon 07/01/2021   Wegener's granulomatosis 2009   Wegner's disease (congenital syphilitic osteochondritis)     Past Surgical History:  Procedure Laterality Date   AMPUTATION Right 10/21/2020   Procedure: Right hallux amputation;  Surgeon: Toni Arthurs, MD;  Location: [facility];  Service: Orthopedics;  Laterality: Right;   AMPUTATION TOE Bilateral 08/12/2020   Procedure: Right 3rd toe amputation; left hallux and 3rd toe amputation;  Surgeon: Toni Arthurs,  MD;  Location: [facility];  Service: Orthopedics;  Laterality: Bilateral;    AMPUTATION TOE Right 03/24/2021   Procedure: AMPUTATION TOE;  Surgeon: Toni Arthurs, MD;  Location: [facility];  Service: Orthopedics;  Laterality: Right;   APPENDECTOMY     BACK SURGERY     BIOPSY  03/30/2022   Procedure: BIOPSY;  Surgeon: Tomma Lightning, MD;  Location: [facility];  Service: Pulmonary;;   BRONCHIAL WASHINGS  03/30/2022   Procedure: BRONCHIAL WASHINGS;  Surgeon: Tomma Lightning, MD;  Location: [facility];  Service: Pulmonary;;   CHOLECYSTECTOMY     COLONOSCOPY N/A 06/02/2013   Procedure: COLONOSCOPY;  Surgeon: Graylin Shiver, MD;  Location: [facility];  Service: Endoscopy;  Laterality: N/A;   HERNIA REPAIR  2000   INCISION AND DRAINAGE ABSCESS Left 07/07/2016   Procedure: DEBRIDMENT LEFT THIGH ABSCESS, EXISION ACUTE SKIN RASH LEFT THIGH(1CM LESION);  Surgeon: Claud Kelp, MD;  Location: [facility];  Service: General;  Laterality: Left;   IR FLUORO  GUIDE CV LINE RIGHT  04/24/2022   IR IVC FILTER PLMT / S&I /IMG GUID/MOD SED  05/17/2022   IR US GUIDE VASC ACCESS RIGHT  04/24/2022   SEPTOPLASTY  02/2013   spleen anuyism     splenic aneurysm     TRANSMETATARSAL AMPUTATION Bilateral 10/21/2020   Procedure: Left transmetatarsal amputation;  Surgeon: Toni Arthurs, MD;  Location: [facility];  Service: Orthopedics;  Laterality: Bilateral;   TRANSMETATARSAL AMPUTATION Left 03/24/2021   Procedure: TRANSMETATARSAL AMPUTATION;  Surgeon: Toni Arthurs, MD;  Location: [facility];  Service: Orthopedics;  Laterality: Left;   VAGINAL HYSTERECTOMY     VIDEO BRONCHOSCOPY Right 03/30/2022   Procedure: VIDEO BRONCHOSCOPY WITH FLUORO;  Surgeon: Tomma Lightning, MD;  Location: [facility];  Service: Pulmonary;  Laterality: Right;  atypical pneumonia    Family History  Problem Relation Age of Onset   Diabetes Mother    Heart disease Mother    Diabetes Father     Allergies as of 02/07/2023 - Review Complete 02/07/2023  Allergen Reaction Noted   Ibuprofen Other (See Comments) 05/06/2015   Penicillins Rash and Other (See Comments) 03/23/2011    Social History   Socioeconomic History   Marital status: Married    Spouse name: Not on file   Number of children: Not on file   Years of education: Not on file   Highest education level: Not on file  Occupational History   Not on file  Tobacco Use   Smoking status: Never   Smokeless tobacco: Never  Vaping Use   Vaping Use: Never used  Substance and Sexual Activity   Alcohol use: Yes    Comment: occasionally   Drug use: No   Sexual activity: Yes    Birth control/protection: Surgical  Other Topics Concern   Not on file  Social History Narrative   Not on file   Social Determinants of Health   Financial Resource Strain: Not on file  Food Insecurity: Not on file  Transportation Needs: Not on file  Physical Activity: Not on file  Stress: Not on file  Social Connections: Not on file   Intimate Partner Violence: Not on file     Review of Systems   Gen: Denies any fever, chills, fatigue, weight loss, lack of appetite.  CV: Denies chest pain, heart palpitations, peripheral edema, syncope.  Resp: Denies shortness of breath at rest or with exertion. Denies wheezing or cough.  GI: see HPI GU :  Denies urinary burning, urinary frequency, urinary hesitancy MS: Denies joint pain, muscle weakness, cramps, or limitation of movement.  Derm: Denies rash, itching, dry skin Psych: Denies depression, anxiety, memory loss, and confusion Heme: Denies bruising, bleeding, and enlarged lymph nodes.   Physical Exam   BP 120/81   Pulse 89   Temp 97.8 F (36.6 C)   Ht 5\' 4"  (1.626 m)   Wt 229 lb (103.9 kg)   BMI 39.31 kg/m   General:   Alert and oriented. Pleasant and cooperative. Well-nourished and well-developed.  Head:  Normocephalic and atraumatic. Eyes:  Without icterus, sclera clear and conjunctiva pink.  Ears:  Normal auditory acuity. Mouth:  No deformity or lesions, oral mucosa pink.  Lungs:  Clear to auscultation bilaterally. No wheezes, rales, or rhonchi. No distress.  Heart:  S1, S2 present without murmurs appreciated.  Abdomen:  +BS, soft, non-tender and non-distended. No HSM noted. No guarding or rebound. No masses appreciated.  Rectal:  Deferred  Msk:  Symmetrical without gross deformities. Normal posture. Extremities:  Without edema. Neurologic:  Alert and  oriented x4;  grossly normal neurologically. Skin:  Intact without significant lesions or rashes. Psych:  Alert and cooperative. Normal mood and affect.   Assessment   [name] is a 56 y.o. 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  Odynophagia/dysphagia, GERD: History  of dysphagia dating back to June 2023.  Went to BPE with short segment stricture of the distal esophagus at the GE junction and mild esophageal dysmotility.  Also with subsequent speech therapy eval with mild oropharyngeal dysphagia admitted following dysphagia 3/mechanical soft diet.  In February she was having some odynophagia and some mild worsening dysphagia although not currently having some improvement.  Primarily having symptoms with solids but occasionally liquids as well with some regurgitation.  Still some slight worsening of reflux symptoms recently, needing Tums daily.  Maintained on Protonix once daily currently.  Will increase PPI to twice daily and scheduled for EGD with dilation to assess for esophagitis, gastritis, duodenitis, peptic ulcer disease, and evaluation for possible underlying esophageal stricture noted on prior BPE.  Constipation: History of chronic intermittent constipation.  Usually does well with taking fiber daily with a bowel movement every other day or every 2-3 days.  Will occasionally have intermittent diarrhea, likely overflow.  Uses Linzess as needed.  Continue current regimen.  Screening for colon cancer: Colonoscopy in August 2014 normal.  Denies any family history of colon cancer or colon polyps.  Given she is undergoing upper endoscopy we will also perform colonoscopy concurrently.  No alarm symptoms present at this time.  Does have occasional toilet tissue medic easier related to hemorrhoids.  PLAN   Increase pantoprazole to 40 mg twice daily.  GERD diet Proceed with upper endoscopy with dilation and colonoscopy with propofol by Dr. Jena Gauss in near future: the risks, benefits, and alternatives have been discussed with the patient in detail. The patient states understanding and desires to proceed. ASA 3 Hold metformin night prior to and morning of procedure.  Trilyte prep Linzess for 3 days prior Continue daily fiber supplement Continue Linzess as needed.   Follow up in 3 months   Brooke Bonito, MSN, [name]-BC, AGACNP-BC [facility]

## 2023-02-07 ENCOUNTER — Encounter: Payer: Self-pay | Admitting: *Deleted

## 2023-02-07 ENCOUNTER — Encounter: Payer: Self-pay | Admitting: Gastroenterology

## 2023-02-07 ENCOUNTER — Ambulatory Visit: Payer: BC Managed Care – PPO | Admitting: Gastroenterology

## 2023-02-07 VITALS — BP 120/81 | HR 89 | Temp 97.8°F | Ht 64.0 in | Wt 229.0 lb

## 2023-02-07 DIAGNOSIS — K219 Gastro-esophageal reflux disease without esophagitis: Secondary | ICD-10-CM

## 2023-02-07 DIAGNOSIS — R1310 Dysphagia, unspecified: Secondary | ICD-10-CM

## 2023-02-07 DIAGNOSIS — K649 Unspecified hemorrhoids: Secondary | ICD-10-CM

## 2023-02-07 DIAGNOSIS — Z1211 Encounter for screening for malignant neoplasm of colon: Secondary | ICD-10-CM

## 2023-02-07 LAB — CYTOLOGY - PAP
Chlamydia: NEGATIVE
Comment: NEGATIVE
Comment: NEGATIVE
Comment: NORMAL
Diagnosis: NEGATIVE
Neisseria Gonorrhea: NEGATIVE
Trichomonas: NEGATIVE

## 2023-02-07 MED ORDER — PANTOPRAZOLE SODIUM 40 MG PO TBEC: 40.0000 mg | DELAYED_RELEASE_TABLET | Freq: Two times a day (BID) | ORAL | 0 refills | Status: DC

## 2023-02-07 NOTE — Patient Instructions (Addendum)
We are scheduling you for a upper endoscopy and colonoscopy in the near future with Dr. Jena Gauss.  You received separate detailed written instructions regarding your prep.  Please increase your pantoprazole to 40 mg twice daily, 30 minutes prior to breakfast and dinner.  Follow a GERD diet:  Avoid fried, fatty, greasy, spicy, citrus foods. Avoid caffeine and carbonated beverages. Avoid chocolate. Try eating 4-6 small meals a day rather than 3 large meals. Do not eat within 3 hours of laying down. Prop head of bed up on wood or bricks to create a 6 inch incline.   Continue daily fiber supplement.  Continue to use Linzess as needed.  I would like for you to take your Linzess daily for 3 days prior to your colon prep to ensure you have good results.  Will have you follow-up in 3 months, sooner if needed.  It was a pleasure to see you today. I want to create trusting relationships with patients. If you receive a survey regarding your visit,  I greatly appreciate you taking time to fill this out on paper or through your MyChart. I value your feedback.  Brooke Bonito, MSN, [name]-BC, AGACNP-BC [facility]

## 2023-02-08 ENCOUNTER — Ambulatory Visit (HOSPITAL_COMMUNITY): Payer: BC Managed Care – PPO | Admitting: Physical Therapy

## 2023-02-08 NOTE — Progress Notes (Signed)
Please inform the patient that her pap smear was negative for abnormal growth or malignancy on her cervix.

## 2023-02-09 ENCOUNTER — Ambulatory Visit (HOSPITAL_COMMUNITY): Payer: BC Managed Care – PPO

## 2023-02-09 ENCOUNTER — Encounter (HOSPITAL_COMMUNITY): Payer: Self-pay

## 2023-02-09 DIAGNOSIS — S81802D Unspecified open wound, left lower leg, subsequent encounter: Secondary | ICD-10-CM

## 2023-02-09 DIAGNOSIS — E11621 Type 2 diabetes mellitus with foot ulcer: Secondary | ICD-10-CM

## 2023-02-09 DIAGNOSIS — S91301A Unspecified open wound, right foot, initial encounter: Secondary | ICD-10-CM

## 2023-02-09 DIAGNOSIS — R262 Difficulty in walking, not elsewhere classified: Secondary | ICD-10-CM | POA: Diagnosis not present

## 2023-02-09 DIAGNOSIS — S91301D Unspecified open wound, right foot, subsequent encounter: Secondary | ICD-10-CM

## 2023-02-09 DIAGNOSIS — S91302A Unspecified open wound, left foot, initial encounter: Secondary | ICD-10-CM

## 2023-02-09 NOTE — Therapy (Signed)
OUTPATIENT PHYSICAL THERAPY Wound Treatment   Patient Name: [name] MRN: [id number] DOB:02-01-1966, 57 y.o., female Today's Date: 02/09/2023   PCP: [name], [name] REFERRING PROVIDER: [name], [name]   END OF SESSION:   PT End of Session - 02/09/23 1237     Visit Number 9    Number of Visits 12    Date for PT Re-Evaluation 02/15/23    Authorization Type [employer]    Progress Note Due on Visit 10    PT Start Time 0902    PT Stop Time 0955    PT Time Calculation (min) 53 min    Activity Tolerance Patient tolerated treatment well    Behavior During Therapy [facility] for tasks assessed/performed                  Past Medical History:  Diagnosis Date   Chronic cough    Chronic pain    Diabetes mellitus without complication    Dyspnea    GERD (gastroesophageal reflux disease)    Hypokalemia    Hypothyroidism    Left wrist pain 06/01/2021   Myonecrosis 07/01/2021   Neurocardiogenic syncope    OSA (obstructive sleep apnea)    does not use CPAP   Osteomyelitis    bilateral feet   Peripheral neuropathy    Presence of intrathecal pump    recieves Prialt/bupivicaine   Tear of gluteus medius tendon 07/01/2021   Wegener's granulomatosis 2009   Wegner's disease (congenital syphilitic osteochondritis)    Past Surgical History:  Procedure Laterality Date   AMPUTATION Right 10/21/2020   Procedure: Right hallux amputation;  Surgeon: Toni Arthurs, MD;  Location: [facility];  Service: Orthopedics;  Laterality: Right;   AMPUTATION TOE Bilateral 08/12/2020   Procedure: Right 3rd toe amputation; left hallux and 3rd toe amputation;  Surgeon: Toni Arthurs, MD;  Location: [facility];  Service: Orthopedics;  Laterality: Bilateral;    AMPUTATION TOE Right 03/24/2021   Procedure: AMPUTATION TOE;  Surgeon: Toni Arthurs, MD;  Location: [facility];  Service: Orthopedics;  Laterality: Right;   APPENDECTOMY     BACK SURGERY     BIOPSY   03/30/2022   Procedure: BIOPSY;  Surgeon: Tomma Lightning, MD;  Location: [facility];  Service: Pulmonary;;   BRONCHIAL WASHINGS  03/30/2022   Procedure: BRONCHIAL WASHINGS;  Surgeon: Tomma Lightning, MD;  Location: [facility];  Service: Pulmonary;;   CHOLECYSTECTOMY     COLONOSCOPY N/A 06/02/2013   Procedure: COLONOSCOPY;  Surgeon: Graylin Shiver, MD;  Location: [facility];  Service: Endoscopy;  Laterality: N/A;   HERNIA REPAIR  2000   INCISION AND DRAINAGE ABSCESS Left 07/07/2016   Procedure: DEBRIDMENT LEFT THIGH ABSCESS, EXISION ACUTE SKIN RASH LEFT THIGH(1CM LESION);  Surgeon: Claud Kelp, MD;  Location: [facility];  Service: General;  Laterality: Left;   IR FLUORO GUIDE CV LINE RIGHT  04/24/2022   IR IVC FILTER PLMT / S&I /IMG GUID/MOD SED  05/17/2022   IR US GUIDE VASC ACCESS RIGHT  04/24/2022   SEPTOPLASTY  02/2013   spleen anuyism     splenic aneurysm     TRANSMETATARSAL AMPUTATION Bilateral 10/21/2020   Procedure: Left transmetatarsal amputation;  Surgeon: Toni Arthurs, MD;  Location: [facility];  Service: Orthopedics;  Laterality: Bilateral;   TRANSMETATARSAL AMPUTATION Left 03/24/2021   Procedure: TRANSMETATARSAL AMPUTATION;  Surgeon: Toni Arthurs, MD;  Location: [facility] OR;  Service: Orthopedics;  Laterality: Left;   VAGINAL HYSTERECTOMY  VIDEO BRONCHOSCOPY Right 03/30/2022   Procedure: VIDEO BRONCHOSCOPY WITH FLUORO;  Surgeon: Tomma Lightning, MD;  Location: [facility];  Service: Pulmonary;  Laterality: Right;  atypical pneumonia   Patient Active Problem List   Diagnosis Date Noted   Cervical cancer screening 02/05/2023   Odynophagia 01/31/2023   Edema, peripheral 01/31/2023   Sleep disturbance 01/02/2023   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 12/13/2022   Tinea of groin 09/30/2022   Need for immunization against influenza 09/30/2022   Aortic atherosclerosis 06/29/2022   Type 2 diabetes mellitus 06/29/2022   Acute deep vein thrombosis (DVT) of left  lower extremity 05/17/2022   Rhabdomyolysis 04/18/2022   Transaminitis 04/17/2022   Recurrent pneumonia 04/12/2022   Pulmonary embolism 03/28/2022   Severe sepsis 03/26/2022   UTI (urinary tract infection) 03/26/2022   Opioid dependence 03/26/2022   Acute metabolic encephalopathy 03/26/2022   Hip pain 02/20/2022   Arthritis of right hip 02/19/2022   Hypothyroidism    AKI (acute kidney injury)    Class 2 obesity    Depression    Essential hypertension    Acute cystitis without hematuria 01/04/2022   Dysfunction of left rotator cuff 01/04/2022   Lobar pneumonia 01/04/2022   Acquired absence of other toe(s), unspecified side 08/01/2021   Myositis of right lower extremity 07/01/2021   Tear of gluteus medius tendon 07/01/2021   Left wrist pain 06/01/2021   Medication monitoring encounter 05/10/2021   Chronic pain 03/24/2021   COVID-19 virus infection 11/01/2020   Stage 3a chronic kidney disease 09/08/2019   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   Steroid-induced hyperglycemia 05/11/2018   Morbid obesity 01/25/2018   Neuropathy 01/25/2018   Presence of intrathecal pump 08/02/2017   Rectocele 03/09/2017   Abnormal finding on imaging 12/19/2016   Esophageal dysphagia 12/19/2016   Current chronic use of systemic steroids 10/26/2016   IFG (impaired fasting glucose) 10/26/2016   Arthralgia of multiple joints 07/15/2016   Headache 07/15/2016   Peripheral neuropathy 07/04/2016   Rash of body 07/04/2016   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   Neuropathic pain of both feet 01/11/2016   OSA (obstructive sleep apnea) 12/29/2015   Chronic insomnia 12/29/2015   Obesity, Class II, BMI 35-39.9, with comorbidity 12/29/2015   Polyneuropathy associated with underlying disease 12/29/2015   Class 2 drug-induced obesity with serious comorbidity and body mass index (BMI) of 35.0 to 35.9 in adult 12/29/2015   Other mechanical complication of implanted electronic neurostimulator  of spinal cord electrode (lead), initial encounter 08/04/2015   Adjustment disorder with depressed mood 03/29/2015   Mononeuritis 07/20/2014   Obesity (BMI 30-39.9) 07/20/2014   Neurocardiogenic syncope 07/06/2014   Normocytic anemia 06/08/2014   POTS (postural orthostatic tachycardia syndrome) 06/08/2014   Encounter for long-term (current) use of high-risk medication 05/30/2013   Fall 05/30/2013   Other long term (current) drug therapy 05/30/2013   Adrenocortical insufficiency 05/30/2013   Glucocorticoid deficiency 05/30/2013   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   REFERRING DIAG: E11.621,L97.512 (ICD-10-CM) - Diabetic ulcer of toe of right foot associated with type 2 diabetes mellitus, with fat  layer exposed (HCC)  THERAPY DIAG:  Difficulty in walking   Rationale for Evaluation and Treatment: Rehabilitation     Wound Therapy - 02/09/23 0001     Subjective Pt arrived with dressings intact, stated some pain in Rt foot today.  Reports a fall yesterday at doctor's office walking with her father who fell on top of her with some bruising on hand.    Patient and Family Stated Goals wounds to heal    Date of Onset 08/22/22    Prior Treatments self care    Pain Scale 0-10    Pain Score 6     Pain Type Chronic pain    Pain Location Foot    Pain Orientation Right;Distal    Pain Onset On-going    Patients Stated Pain Goal 0    Pain Intervention(s) Emotional support;Repositioned    Evaluation and Treatment Procedures Explained to  Patient/Family Yes    Evaluation and Treatment Procedures agreed to    Wound Properties Date First Assessed: 02/01/23 Time First Assessed: 0920 Wound Type: Non-pressure wound Location: Leg Location Orientation: Left;Lower Wound Description (Comments): over 10 wounds including one on pt heal spread throughout LE.  These were present when pt initially came but there was not order for treatment. Present on Admission: Yes   Wound Image Images linked: 1    Dressing Type Impregnated gauze (petrolatum);Gauze (Comment);Compression wrap    Dressing Changed Changed    Dressing Status Clean, Dry, Intact    Dressing Change Frequency PRN    Site / Wound Assessment Dry;Painful;Red    Peri-wound Assessment Edema    Drainage Amount Scant    Treatment Cleansed;Debridement (Selective)    Wound Properties Date First Assessed: 01/04/23 Time First Assessed: 1355   Wound Image Images linked: 1    Dressing Type Compression wrap   vaseline perimeter, medihoney, gauze, kerlix, coban, netting #5   Dressing Status Clean;Dry    Dressing Change Frequency PRN    Site / Wound Assessment Granulation tissue    % Wound base Red or Granulating 90%    % Wound base Yellow/Fibrinous Exudate 10%    Peri-wound Assessment Intact   callous   Margins --   callous perimeter   Drainage Amount Minimal    Drainage Description Serous    Treatment Cleansed;Debridement (Selective)    Selective Debridement (non-excisional) - Location edges of wound and callous surrounding, wound beds    Selective Debridement (non-excisional) - Tools Used Forceps;Scissors    Selective Debridement (non-excisional) - Tissue Removed callous, devitalized tissue    Wound Therapy - Clinical Statement see below    Wound Therapy - Functional Problem List unable to wear shoes , difficult with walking    Factors Delaying/Impairing Wound Healing Altered sensation;Diabetes Mellitus;Multiple medical problems;Polypharmacy    Hydrotherapy Plan Debridement;Dressing  change;Patient/family education    Wound Therapy - Frequency 2X / week    Wound Therapy - Current Recommendations PT    Wound Plan debride and dress as needed    Dressing  Left: vaseline to perimeter of wound, lotion to LE then vaseline. xeroform, 2X2 to wound, kerlix, coban to knee, #5 netting    Dressing Right: vaseline perimeter, medihoney, gauze, kerlix, coban, netting #5               PATIENT EDUCATION: Education details: Dressing should not be painful take off if pt has increased pain.  Keep dressing dry take off if it gets wet Person educated: Patient Education method: Explanation Education comprehension: verbalized understanding  HOME EXERCISE PROGRAM: None issued   GOALS: Goals reviewed with patient? Yes  SHORT TERM GOALS: Target date: 01/24/2023  Other 7 wounds beside the main wound to be healed  Baseline: Goal status: IN PROGRESS  2.  Pt to be wearing Darco boot to avoid pressure on wound  Baseline:  Goal status: IN PROGRESS    LONG TERM GOALS: Target date: 02/15/2023  Wound to have no depth and be no greater than .5x .5cm in size to allow pt to feel comfortable with self care. Baseline:  Goal status: IN PROGRESS  2.  PT pain to be no greater than a 2/10 Baseline:  Goal status: IN PROGRESS    ASSESSMENT:  CLINICAL IMPRESSION: Selective debridement for removal of dry skin and callous to promote healing.  Lt LE wounds all red, granulating and approximating well.  Increased time spent with removal of callous Rt foot.  Discussed pressure points to promote healing, encouraged pt to contact [facility] to see if able to get relief as reports she feels pressure with her tennis shoes.  Contihued with same dressings and compression with kerlix and coban.    OBJECTIVE IMPAIRMENTS: difficulty walking, obesity, pain, and decreased skin integrity .   ACTIVITY LIMITATIONS: dressing, hygiene/grooming, and locomotion level  PARTICIPATION LIMITATIONS: shopping and  community activity  PERSONAL FACTORS: Fitness, Time since onset of injury/illness/exacerbation, and 1-2 comorbidities: obesity, DM, Lt transmetatarsal amputation  are also affecting patient's functional outcome.   REHAB POTENTIAL: Good  CLINICAL DECISION MAKING: Stable/uncomplicated  EVALUATION COMPLEXITY: moderate  PLAN: PT FREQUENCY: 2x/week  PT DURATION: 6 weeks  PLANNED INTERVENTIONS: Patient/Family education, Self Care, and debridement and dressing change.  PLAN FOR NEXT SESSION: Continue to monitor wound debride and dress as appropriate.    Becky Sax, LPTA/CLT; CBIS [phone]  12:38 PM, 02/09/23

## 2023-02-12 ENCOUNTER — Ambulatory Visit (HOSPITAL_COMMUNITY): Payer: BC Managed Care – PPO | Admitting: Physical Therapy

## 2023-02-12 DIAGNOSIS — R262 Difficulty in walking, not elsewhere classified: Secondary | ICD-10-CM | POA: Diagnosis not present

## 2023-02-12 DIAGNOSIS — E11621 Type 2 diabetes mellitus with foot ulcer: Secondary | ICD-10-CM

## 2023-02-12 DIAGNOSIS — S91302A Unspecified open wound, left foot, initial encounter: Secondary | ICD-10-CM

## 2023-02-12 DIAGNOSIS — S91301D Unspecified open wound, right foot, subsequent encounter: Secondary | ICD-10-CM

## 2023-02-12 NOTE — Therapy (Signed)
OUTPATIENT PHYSICAL THERAPY Wound Treatment/Progress note   Patient Name: [name] MRN: [id number] DOB:[birth year], 57 y.o., female Today's Date: 02/12/2023  Progress Note Reporting Period 01/04/23 to 02/12/23  See note below for Objective Data and Assessment of Progress/Goals.      PCP: [name], [name] REFERRING PROVIDER: [name], [name]   END OF SESSION:   PT End of Session - 02/12/23 0915     Visit Number 10    Number of Visits 12    Date for PT Re-Evaluation 02/15/23    Authorization Type [employer]    Progress Note Due on Visit 10    PT Start Time 0822    PT Stop Time 0908    PT Time Calculation (min) 46 min    Activity Tolerance Patient tolerated treatment well    Behavior During Therapy [facility] for tasks assessed/performed                  Past Medical History:  Diagnosis Date   Chronic cough    Chronic pain    Diabetes mellitus without complication    Dyspnea    GERD (gastroesophageal reflux disease)    Hypokalemia    Hypothyroidism    Left wrist pain 06/01/2021   Myonecrosis 07/01/2021   Neurocardiogenic syncope    OSA (obstructive sleep apnea)    does not use CPAP   Osteomyelitis    bilateral feet   Peripheral neuropathy    Presence of intrathecal pump    recieves Prialt/bupivicaine   Tear of gluteus medius tendon 07/01/2021   Wegener's granulomatosis 2009   Wegner's disease (congenital syphilitic osteochondritis)    Past Surgical History:  Procedure Laterality Date   AMPUTATION Right 10/21/2020   Procedure: Right hallux amputation;  Surgeon: Toni Arthurs, MD;  Location: [facility];  Service: Orthopedics;  Laterality: Right;   AMPUTATION TOE Bilateral 08/12/2020   Procedure: Right 3rd toe amputation; left hallux and 3rd toe amputation;  Surgeon: Toni Arthurs, MD;  Location: [facility];  Service: Orthopedics;  Laterality: Bilateral;    AMPUTATION TOE Right 03/24/2021   Procedure: AMPUTATION  TOE;  Surgeon: Toni Arthurs, MD;  Location: [facility];  Service: Orthopedics;  Laterality: Right;   APPENDECTOMY     BACK SURGERY     BIOPSY  03/30/2022   Procedure: BIOPSY;  Surgeon: Tomma Lightning, MD;  Location: [facility];  Service: Pulmonary;;   BRONCHIAL WASHINGS  03/30/2022   Procedure: BRONCHIAL WASHINGS;  Surgeon: Tomma Lightning, MD;  Location: [facility];  Service: Pulmonary;;   CHOLECYSTECTOMY     COLONOSCOPY N/A 06/02/2013   Procedure: COLONOSCOPY;  Surgeon: Graylin Shiver, MD;  Location: [facility];  Service: Endoscopy;  Laterality: N/A;   HERNIA REPAIR  2000   INCISION AND DRAINAGE ABSCESS Left 07/07/2016   Procedure: DEBRIDMENT LEFT THIGH ABSCESS, EXISION ACUTE SKIN RASH LEFT THIGH(1CM LESION);  Surgeon: Claud Kelp, MD;  Location: [facility];  Service: General;  Laterality: Left;   IR FLUORO GUIDE CV LINE RIGHT  04/24/2022   IR IVC FILTER PLMT / S&I /IMG GUID/MOD SED  05/17/2022   IR US GUIDE VASC ACCESS RIGHT  04/24/2022   SEPTOPLASTY  02/2013   spleen anuyism     splenic aneurysm     TRANSMETATARSAL AMPUTATION Bilateral 10/21/2020   Procedure: Left transmetatarsal amputation;  Surgeon: Toni Arthurs, MD;  Location: [facility];  Service: Orthopedics;  Laterality: Bilateral;   TRANSMETATARSAL AMPUTATION Left 03/24/2021  Procedure: TRANSMETATARSAL AMPUTATION;  Surgeon: Toni Arthurs, MD;  Location: [facility];  Service: Orthopedics;  Laterality: Left;   VAGINAL HYSTERECTOMY     VIDEO BRONCHOSCOPY Right 03/30/2022   Procedure: VIDEO BRONCHOSCOPY WITH FLUORO;  Surgeon: Tomma Lightning, MD;  Location: [facility];  Service: Pulmonary;  Laterality: Right;  atypical pneumonia   Patient Active Problem List   Diagnosis Date Noted   Cervical cancer screening 02/05/2023   Odynophagia 01/31/2023   Edema, peripheral 01/31/2023   Sleep disturbance 01/02/2023   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 12/13/2022   Tinea of groin 09/30/2022   Need for immunization against  influenza 09/30/2022   Aortic atherosclerosis 06/29/2022   Type 2 diabetes mellitus 06/29/2022   Acute deep vein thrombosis (DVT) of left lower extremity 05/17/2022   Rhabdomyolysis 04/18/2022   Transaminitis 04/17/2022   Recurrent pneumonia 04/12/2022   Pulmonary embolism 03/28/2022   Severe sepsis 03/26/2022   UTI (urinary tract infection) 03/26/2022   Opioid dependence 03/26/2022   Acute metabolic encephalopathy 03/26/2022   Hip pain 02/20/2022   Arthritis of right hip 02/19/2022   Hypothyroidism    AKI (acute kidney injury)    Class 2 obesity    Depression    Essential hypertension    Acute cystitis without hematuria 01/04/2022   Dysfunction of left rotator cuff 01/04/2022   Lobar pneumonia 01/04/2022   Acquired absence of other toe(s), unspecified side 08/01/2021   Myositis of right lower extremity 07/01/2021   Tear of gluteus medius tendon 07/01/2021   Left wrist pain 06/01/2021   Medication monitoring encounter 05/10/2021   Chronic pain 03/24/2021   COVID-19 virus infection 11/01/2020   Stage 3a chronic kidney disease 09/08/2019   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   Steroid-induced hyperglycemia 05/11/2018   Morbid obesity 01/25/2018   Neuropathy 01/25/2018   Presence of intrathecal pump 08/02/2017   Rectocele 03/09/2017   Abnormal finding on imaging 12/19/2016   Esophageal dysphagia 12/19/2016   Current chronic use of systemic steroids 10/26/2016   IFG (impaired fasting glucose) 10/26/2016   Arthralgia of multiple joints 07/15/2016   Headache 07/15/2016   Peripheral neuropathy 07/04/2016   Rash of body 07/04/2016   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   Neuropathic pain of both feet 01/11/2016   OSA (obstructive sleep apnea) 12/29/2015   Chronic insomnia 12/29/2015   Obesity, Class II, BMI 35-39.9, with comorbidity 12/29/2015   Polyneuropathy associated with underlying disease 12/29/2015   Class 2 drug-induced obesity with serious  comorbidity and body mass index (BMI) of 35.0 to 35.9 in adult 12/29/2015   Other mechanical complication of implanted electronic neurostimulator of spinal cord electrode (lead), initial encounter 08/04/2015   Adjustment disorder with depressed mood 03/29/2015   Mononeuritis 07/20/2014   Obesity (BMI 30-39.9) 07/20/2014   Neurocardiogenic syncope 07/06/2014   Normocytic anemia 06/08/2014   POTS (postural orthostatic tachycardia syndrome) 06/08/2014   Encounter for long-term (current) use of high-risk medication 05/30/2013   Fall 05/30/2013   Other long term (current) drug therapy 05/30/2013   Adrenocortical insufficiency 05/30/2013   Glucocorticoid deficiency 05/30/2013   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   REFERRING DIAG: E11.621,L97.512 (ICD-10-CM) - Diabetic ulcer of toe of right foot associated with type 2 diabetes mellitus, with fat layer exposed (HCC)  THERAPY DIAG:  Difficulty in walking   Rationale for Evaluation and Treatment: Rehabilitation     Wound Therapy - 02/12/23 0001     Subjective PT states that her pain is about the same but always has chronic pain    Patient and Family Stated Goals wounds to heal    Date of Onset 08/22/22    Prior Treatments self care    Pain Scale 0-10    Pain Score 5     Pain Type Chronic pain    Pain Location Generalized    Pain Orientation Distal;Right    Pain Onset On-going    Patients Stated Pain Goal 3    Pain Intervention(s) Emotional support    Evaluation and Treatment  Procedures Explained to Patient/Family Yes    Evaluation and Treatment Procedures agreed to    Wound Properties Date First Assessed: 02/01/23 Time First Assessed: 0920 Wound Type: Non-pressure wound Location: Leg Location Orientation: Left;Lower Wound Description (Comments): over 10 wounds including one on pt heal spread throughout LE.  These were present when pt initially came but there was not order for treatment. Present on Admission: Yes   Dressing Changed Changed    Dressing Status Clean, Dry, Intact    Dressing Change Frequency PRN    Site / Wound Assessment Dry    % Wound base Red or Granulating --   Pt only has 3 wounds now located on her foot as opposed to the over 11 spread throughout her LE at eval.  90% granualated.   Drainage Amount None    Treatment Cleansed;Debridement (Selective)    Wound Properties Date First Assessed: 01/04/23 Time First Assessed: 1355   Wound Image Images linked: 1    Dressing Type Compression wrap;Honey;Impregnated gauze (bismuth)    Dressing Status Clean;Dry    Dressing Change Frequency PRN    Site / Wound Assessment Granulation tissue;Yellow    % Wound base Red or Granulating 95%    % Wound base Yellow/Fibrinous Exudate 5%    Peri-wound Assessment --   callous which is decreasing making wound appear to be increasing in size.   Wound Length (cm) 0.7 cm   was 1.2   Wound Width (cm) 1.2 cm   was 1 but had undermining   Wound Depth (cm) 0.3 cm   was .2   Wound Volume (cm^3) 0.25 cm^3    Wound Surface Area (cm^2) 0.84 cm^2    Drainage Amount Scant    Drainage Description Serous    Treatment Cleansed;Debridement (Selective)    Selective Debridement (non-excisional) - Location edges of the wound    Selective Debridement (non-excisional) - Tools Used Forceps;Scalpel    Selective Debridement (non-excisional) - Tissue Removed callous    Wound Therapy - Clinical Statement see below    Wound Therapy - Functional Problem List unable to wear shoes , difficult  with walking    Factors Delaying/Impairing Wound Healing Altered sensation;Diabetes Mellitus;Multiple medical problems;Polypharmacy    Hydrotherapy Plan Debridement;Dressing change;Patient/family education    Wound Therapy - Frequency 2X / week    Wound Therapy - Current Recommendations PT    Wound Plan debride and dressing change    Dressing  Left: vaseline to perimeter of wound, lotion to LE then vaseline. xeroform, 2X2 to wound, kerlix, coban to knee, #5 netting    Dressing  Right xeroform to wound followed by compression.               PATIENT EDUCATION: Education details: Dressing should not be painful take off if pt has increased pain.  Keep dressing dry take off if it gets wet Person educated: Patient Education method: Explanation Education comprehension: verbalized understanding   HOME EXERCISE PROGRAM: None issued   GOALS: Goals reviewed with patient? Yes  SHORT TERM GOALS: Target date: 01/24/2023  Other 7 wounds beside the main wound to be healed  Baseline: Goal status: MET  2.  Pt to be wearing Darco boot to avoid pressure on wound  Baseline:  Goal status: REVISED; Pt unable to use due to amputee     LONG TERM GOALS: Target date: 02/15/2023  Wound to have no depth and be no greater than .5x .5cm in size to allow pt to feel comfortable with self care. Baseline:  Goal status: IN PROGRESS  2.  PT pain to be no greater than a 2/10 Baseline:  Goal status: IN PROGRESS    ASSESSMENT:  CLINICAL IMPRESSION: Selective debridement for removal of dry skin and callous to promote healing.  Pt only has three wounds remaining on her Lt foot as opposed to the 11 wounds at evaluation.  Therapist measured pt for compression and gave pt number for elastic therapies.  Therapist explained that once these wounds are healed we will not longer be able to compression wrap and that her leg will begin to swell again unless she orders the garments. Pt verbalized understanding.   Therapist recommended pt calling orthotic as pt feels that her Lt heel wound is due to irritation of shoe/shoe insert.  Therapist used xeroform on Lt foot wound due to being almost 100% granulated.  Therapist explained to pt that once wounds no longer need debridement she will need to be discharged to self care due to dressing changes not being skilled care.  Explained that this is why it is imperative that  she obtain her compression garment.  OBJECTIVE IMPAIRMENTS: difficulty walking, obesity, pain, and decreased skin integrity .   ACTIVITY LIMITATIONS: dressing, hygiene/grooming, and locomotion level  PARTICIPATION LIMITATIONS: shopping and community activity  PERSONAL FACTORS: Fitness, Time since onset of injury/illness/exacerbation, and 1-2 comorbidities: obesity, DM, Lt transmetatarsal amputation  are also affecting patient's functional outcome.   REHAB POTENTIAL: Good  CLINICAL DECISION MAKING: Stable/uncomplicated  EVALUATION COMPLEXITY: moderate  PLAN: PT FREQUENCY: 2x/week  PT DURATION: 6 weeks  PLANNED INTERVENTIONS: Patient/Family education, Self Care, and debridement and dressing change.  PLAN FOR NEXT SESSION: Continue to monitor wound debride and dress as appropriate.   Virgina Organ, PT CLT [phone] 9:08

## 2023-02-15 ENCOUNTER — Ambulatory Visit (HOSPITAL_COMMUNITY): Payer: BC Managed Care – PPO

## 2023-02-16 ENCOUNTER — Encounter (HOSPITAL_COMMUNITY): Payer: Self-pay

## 2023-02-16 ENCOUNTER — Ambulatory Visit (HOSPITAL_COMMUNITY): Payer: BC Managed Care – PPO

## 2023-02-16 DIAGNOSIS — R262 Difficulty in walking, not elsewhere classified: Secondary | ICD-10-CM

## 2023-02-16 DIAGNOSIS — E11621 Type 2 diabetes mellitus with foot ulcer: Secondary | ICD-10-CM

## 2023-02-16 DIAGNOSIS — S81802D Unspecified open wound, left lower leg, subsequent encounter: Secondary | ICD-10-CM

## 2023-02-16 DIAGNOSIS — S91301A Unspecified open wound, right foot, initial encounter: Secondary | ICD-10-CM

## 2023-02-16 DIAGNOSIS — S91302A Unspecified open wound, left foot, initial encounter: Secondary | ICD-10-CM

## 2023-02-16 DIAGNOSIS — S91301D Unspecified open wound, right foot, subsequent encounter: Secondary | ICD-10-CM

## 2023-02-16 NOTE — Addendum Note (Signed)
Addended by: Bella Kennedy on: 02/16/2023 04:15 PM   Modules accepted: Orders

## 2023-02-16 NOTE — Therapy (Addendum)
OUTPATIENT PHYSICAL THERAPY Wound Treatment   Patient Name: [name] MRN: [id number] DOB:[birth year], 57 y.o., female Today's Date: 02/16/2023  PCP: [name], [name] REFERRING PROVIDER: [name], [name]   END OF SESSION:   PT End of Session - 02/16/23 1558     Visit Number 11    Number of Visits 23   Authorization Type BCBS    PT Start Time 1045    PT Stop Time 1137    PT Time Calculation (min) 52 min    Activity Tolerance Patient tolerated treatment well    Behavior During Therapy WFL for tasks assessed/performed                  Past Medical History:  Diagnosis Date   Chronic cough    Chronic pain    Diabetes mellitus without complication (HCC)    Dyspnea    GERD (gastroesophageal reflux disease)    Hypokalemia    Hypothyroidism    Left wrist pain 06/01/2021   Myonecrosis (HCC) 07/01/2021   Neurocardiogenic syncope    OSA (obstructive sleep apnea)    does not use CPAP   Osteomyelitis (HCC)    bilateral feet   Peripheral neuropathy    Presence of intrathecal pump    recieves Prialt/bupivicaine   Tear of gluteus medius tendon 07/01/2021   Wegener's granulomatosis 2009   Wegner's disease (congenital syphilitic osteochondritis)    Past Surgical History:  Procedure Laterality Date   AMPUTATION Right 10/21/2020   Procedure: Right hallux amputation;  Surgeon: Toni Arthurs, MD;  Location: [facility];  Service: Orthopedics;  Laterality: Right;   AMPUTATION TOE Bilateral 08/12/2020   Procedure: Right 3rd toe amputation; left hallux and 3rd toe amputation;  Surgeon: Toni Arthurs, MD;  Location: [facility];  Service: Orthopedics;  Laterality: Bilateral;    AMPUTATION TOE Right 03/24/2021   Procedure: AMPUTATION TOE;  Surgeon: Toni Arthurs, MD;  Location: [facility];  Service: Orthopedics;  Laterality: Right;   APPENDECTOMY     BACK SURGERY     BIOPSY  03/30/2022   Procedure: BIOPSY;  Surgeon: Tomma Lightning,  MD;  Location: [facility];  Service: Pulmonary;;   BRONCHIAL WASHINGS  03/30/2022   Procedure: BRONCHIAL WASHINGS;  Surgeon: Tomma Lightning, MD;  Location: [facility];  Service: Pulmonary;;   CHOLECYSTECTOMY     COLONOSCOPY N/A 06/02/2013   Procedure: COLONOSCOPY;  Surgeon: Graylin Shiver, MD;  Location: [facility];  Service: Endoscopy;  Laterality: N/A;   HERNIA REPAIR  2000   INCISION AND DRAINAGE ABSCESS Left 07/07/2016   Procedure: DEBRIDMENT LEFT THIGH ABSCESS, EXISION ACUTE SKIN RASH LEFT THIGH(1CM LESION);  Surgeon: Claud Kelp, MD;  Location: [facility];  Service: General;  Laterality: Left;   IR FLUORO GUIDE CV LINE RIGHT  04/24/2022   IR IVC FILTER PLMT / S&I /IMG GUID/MOD SED  05/17/2022   IR US GUIDE VASC ACCESS RIGHT  04/24/2022   SEPTOPLASTY  02/2013   spleen anuyism     splenic aneurysm     TRANSMETATARSAL AMPUTATION Bilateral 10/21/2020   Procedure: Left transmetatarsal amputation;  Surgeon: Toni Arthurs, MD;  Location: [facility];  Service: Orthopedics;  Laterality: Bilateral;   TRANSMETATARSAL AMPUTATION Left 03/24/2021   Procedure: TRANSMETATARSAL AMPUTATION;  Surgeon: Toni Arthurs, MD;  Location: [facility];  Service: Orthopedics;  Laterality: Left;   VAGINAL HYSTERECTOMY     VIDEO BRONCHOSCOPY Right 03/30/2022   Procedure: VIDEO BRONCHOSCOPY WITH FLUORO;  Surgeon:  Tomma Lightning, MD;  Location: [facility];  Service: Pulmonary;  Laterality: Right;  atypical pneumonia   Patient Active Problem List   Diagnosis Date Noted   Cervical cancer screening 02/05/2023   Odynophagia 01/31/2023   Edema, peripheral 01/31/2023   Sleep disturbance 01/02/2023   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) 12/13/2022   Tinea of groin 09/30/2022   Need for immunization against influenza 09/30/2022   Aortic atherosclerosis (HCC) 06/29/2022   Type 2 diabetes mellitus (HCC) 06/29/2022   Acute deep vein thrombosis (DVT) of left lower extremity (HCC) 05/17/2022    Rhabdomyolysis 04/18/2022   Transaminitis 04/17/2022   Recurrent pneumonia 04/12/2022   Pulmonary embolism (HCC) 03/28/2022   Severe sepsis (HCC) 03/26/2022   UTI (urinary tract infection) 03/26/2022   Opioid dependence (HCC) 03/26/2022   Acute metabolic encephalopathy 03/26/2022   Hip pain 02/20/2022   Arthritis of right hip 02/19/2022   Hypothyroidism    AKI (acute kidney injury) (HCC)    Class 2 obesity    Depression    Essential hypertension    Acute cystitis without hematuria 01/04/2022   Dysfunction of left rotator cuff 01/04/2022   Lobar pneumonia (HCC) 01/04/2022   Acquired absence of other toe(s), unspecified side (HCC) 08/01/2021   Myositis of right lower extremity 07/01/2021   Tear of gluteus medius tendon 07/01/2021   Left wrist pain 06/01/2021   Medication monitoring encounter 05/10/2021   Chronic pain 03/24/2021   COVID-19 virus infection 11/01/2020   Stage 3a chronic kidney disease (HCC) 09/08/2019   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   Steroid-induced hyperglycemia 05/11/2018   Morbid obesity (HCC) 01/25/2018   Neuropathy 01/25/2018   Presence of intrathecal pump 08/02/2017   Rectocele 03/09/2017   Abnormal finding on imaging 12/19/2016   Esophageal dysphagia 12/19/2016   Current chronic use of systemic steroids 10/26/2016   IFG (impaired fasting glucose) 10/26/2016   Arthralgia of multiple joints 07/15/2016   Headache 07/15/2016   Peripheral neuropathy 07/04/2016   Rash of body 07/04/2016   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   Neuropathic pain of both feet 01/11/2016   OSA (obstructive sleep apnea) 12/29/2015   Chronic insomnia 12/29/2015   Obesity, Class II, BMI 35-39.9, with comorbidity 12/29/2015   Polyneuropathy associated with underlying disease (HCC) 12/29/2015   Class 2 drug-induced obesity with serious comorbidity and body mass index (BMI) of 35.0 to 35.9 in adult 12/29/2015   Other mechanical complication of  implanted electronic neurostimulator of spinal cord electrode (lead), initial encounter (HCC) 08/04/2015   Adjustment disorder with depressed mood 03/29/2015   Mononeuritis 07/20/2014   Obesity (BMI 30-39.9) 07/20/2014   Neurocardiogenic syncope 07/06/2014   Normocytic anemia 06/08/2014   POTS (postural orthostatic tachycardia syndrome) 06/08/2014   Encounter for long-term (current) use of high-risk medication 05/30/2013   Fall 05/30/2013   Other long term (current) drug therapy 05/30/2013   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/30/2013   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/2013   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   REFERRING DIAG: E11.621,L97.512 (ICD-10-CM) - Diabetic ulcer of toe of right foot  associated with type 2 diabetes mellitus, with fat layer exposed (HCC)  THERAPY DIAG:  Difficulty in walking   Rationale for Evaluation and Treatment: Rehabilitation     Wound Therapy - 02/16/23 0001     Subjective Pt stated she changed socks in shoes and feels no pressure on her mid-foot with Rt foot.  Arrived wiht dressings intact.  Generalized pain all over.    Patient and Family Stated Goals wounds to heal    Date of Onset 08/22/22    Prior Treatments self care    Pain Scale 0-10    Pain Score 5     Pain Type Chronic pain    Pain Location Generalized    Pain Orientation Distal;Right    Pain Onset On-going    Patients Stated Pain Goal 3    Pain Intervention(s) Emotional support;Repositioned     Evaluation and Treatment Procedures Explained to Patient/Family Yes    Evaluation and Treatment Procedures agreed to    Wound Properties Date First Assessed: 02/01/23 Time First Assessed: 0920 Wound Type: Non-pressure wound Location: Leg Location Orientation: Left;Lower Wound Description (Comments): over 10 wounds including one on pt heal spread throughout LE.  These were present when pt initially came but there was not order for treatment. Present on Admission: Yes   Wound Image Images linked: 1    Dressing Type --   Instructed to wear compression garments for edema control   Dressing Changed Changed    Dressing Status Clean, Dry, Intact    Dressing Change Frequency PRN    Site / Wound Assessment Dry    % Wound base Red or Granulating 100%    % Wound base Yellow/Fibrinous Exudate 0%    Peri-wound Assessment Edema    Drainage Amount None    Treatment Cleansed    Wound Properties Date First Assessed: 01/04/23 Time First Assessed: 1355   Wound Image Images linked: 1    Dressing Type Compression wrap;Honey;Impregnated gauze (bismuth)    Dressing Status Clean;Dry    Dressing Change Frequency PRN    Site / Wound Assessment Granulation tissue;Yellow    % Wound base Red or Granulating 95%    % Wound base Yellow/Fibrinous Exudate 5%    Peri-wound Assessment --   callous perimeter   Wound Length (cm) 0.7 cm    Wound Width (cm) 1.1 cm    Wound Depth (cm) 0.3 cm    Wound Volume (cm^3) 0.23 cm^3    Wound Surface Area (cm^2) 0.77 cm^2    Drainage Amount Scant    Drainage Description Serous    Treatment Cleansed;Debridement (Selective)    Selective Debridement (non-excisional) - Location edges of the wound    Selective Debridement (non-excisional) - Tools Used Forceps;Scalpel    Selective Debridement (non-excisional) - Tissue Removed callous    Wound Therapy - Clinical Statement see below    Wound Therapy - Functional Problem List unable to wear shoes , difficult with walking    Factors  Delaying/Impairing Wound Healing Altered sensation;Diabetes Mellitus;Multiple medical problems;Polypharmacy    Hydrotherapy Plan Debridement;Dressing change;Patient/family education    Wound Therapy - Frequency 2X / week    Wound Therapy - Current Recommendations PT    Wound Plan debride and dressing change    Dressing  Left: instured to wear compression garments.  Measured and given ETI printout    Dressing Right xeroform to wound followed by compression.               PATIENT EDUCATION: Education details:  Dressing should not be painful take off if pt has increased pain.  Keep dressing dry take off if it gets wet Person educated: Patient Education method: Explanation Education comprehension: verbalized understanding   HOME EXERCISE PROGRAM: None issued   GOALS: Goals reviewed with patient? Yes  SHORT TERM GOALS: Target date: 01/24/2023  Other 7 wounds beside the main wound to be healed  Baseline: Goal status: MET  2.  Pt to be wearing Darco boot to avoid pressure on wound  Baseline:  Goal status: REVISED; Pt unable to use due to amputee     LONG TERM GOALS: Target date: 02/15/2023  Wound to have no depth and be no greater than .5x .5cm in size to allow pt to feel comfortable with self care. Baseline:  Goal status: IN PROGRESS  2.  PT pain to be no greater than a 2/10 Baseline:  Goal status: IN PROGRESS    ASSESSMENT:  CLINICAL IMPRESSION: Upon removal of Lt LE dressings no skilled intervention required.  Pt educated on importance of wearing compression garments with ETI handout given with measurements.  Selective debridement continued to be needed for Rt foot wound for removal of callous perimeter and devitalized tissue in wound bed.  Continued with coban for edema control.  Reports of comfort at EOS.    OBJECTIVE IMPAIRMENTS: difficulty walking, obesity, pain, and decreased skin integrity .   ACTIVITY LIMITATIONS: dressing, hygiene/grooming, and locomotion  level  PARTICIPATION LIMITATIONS: shopping and community activity  PERSONAL FACTORS: Fitness, Time since onset of injury/illness/exacerbation, and 1-2 comorbidities: obesity, DM, Lt transmetatarsal amputation  are also affecting patient's functional outcome.   REHAB POTENTIAL: Good  CLINICAL DECISION MAKING: Stable/uncomplicated  EVALUATION COMPLEXITY: moderate  PLAN: PT FREQUENCY: 2x/week  PT DURATION: 6 weeks; continue for 6 more weeks until Rt foot ulcer is healed.  PLANNED INTERVENTIONS: Patient/Family education, Self Care, and debridement and dressing change.  PLAN FOR NEXT SESSION: Continue to monitor wound debride and dress as appropriate.   Becky Sax, LPTA/CLT; Sunnie Nielsen, PT CLT [phone]  [phone]

## 2023-02-19 ENCOUNTER — Ambulatory Visit (HOSPITAL_COMMUNITY): Payer: BC Managed Care – PPO | Admitting: Physical Therapy

## 2023-02-22 ENCOUNTER — Ambulatory Visit (HOSPITAL_COMMUNITY): Payer: BC Managed Care – PPO | Attending: Internal Medicine | Admitting: Physical Therapy

## 2023-02-22 ENCOUNTER — Other Ambulatory Visit: Payer: Self-pay | Admitting: Family Medicine

## 2023-02-22 DIAGNOSIS — S91301D Unspecified open wound, right foot, subsequent encounter: Secondary | ICD-10-CM | POA: Diagnosis present

## 2023-02-22 DIAGNOSIS — R262 Difficulty in walking, not elsewhere classified: Secondary | ICD-10-CM | POA: Insufficient documentation

## 2023-02-22 DIAGNOSIS — S91301A Unspecified open wound, right foot, initial encounter: Secondary | ICD-10-CM | POA: Insufficient documentation

## 2023-02-22 DIAGNOSIS — L97412 Non-pressure chronic ulcer of right heel and midfoot with fat layer exposed: Secondary | ICD-10-CM | POA: Diagnosis present

## 2023-02-22 DIAGNOSIS — E038 Other specified hypothyroidism: Secondary | ICD-10-CM

## 2023-02-22 DIAGNOSIS — E11621 Type 2 diabetes mellitus with foot ulcer: Secondary | ICD-10-CM | POA: Insufficient documentation

## 2023-02-22 NOTE — Therapy (Signed)
OUTPATIENT PHYSICAL THERAPY Wound Treatment   Patient Name: [name] MRN: [id number] DOB:08/20/1966, 57 y.o., female Today's Date: 02/22/2023  PCP: [name], [name] REFERRING PROVIDER: [name], [name]   END OF SESSION:   PT End of Session - 02/16/23 1558     Visit Number 11    Number of Visits 23   Authorization Type BCBS    PT Start Time 1045    PT Stop Time 1137    PT Time Calculation (min) 52 min    Activity Tolerance Patient tolerated treatment well    Behavior During Therapy WFL for tasks assessed/performed                  Past Medical History:  Diagnosis Date   Chronic cough    Chronic pain    Diabetes mellitus without complication (HCC)    Dyspnea    GERD (gastroesophageal reflux disease)    Hypokalemia    Hypothyroidism    Left wrist pain 06/01/2021   Myonecrosis (HCC) 07/01/2021   Neurocardiogenic syncope    OSA (obstructive sleep apnea)    does not use CPAP   Osteomyelitis (HCC)    bilateral feet   Peripheral neuropathy    Presence of intrathecal pump    recieves Prialt/bupivicaine   Tear of gluteus medius tendon 07/01/2021   Wegener's granulomatosis 2009   Wegner's disease (congenital syphilitic osteochondritis)    Past Surgical History:  Procedure Laterality Date   AMPUTATION Right 10/21/2020   Procedure: Right hallux amputation;  Surgeon: Toni Arthurs, MD;  Location: [facility];  Service: Orthopedics;  Laterality: Right;   AMPUTATION TOE Bilateral 08/12/2020   Procedure: Right 3rd toe amputation; left hallux and 3rd toe amputation;  Surgeon: Toni Arthurs, MD;  Location: [facility];  Service: Orthopedics;  Laterality: Bilateral;    AMPUTATION TOE Right 03/24/2021   Procedure: AMPUTATION TOE;  Surgeon: Toni Arthurs, MD;  Location: [facility];  Service: Orthopedics;  Laterality: Right;   APPENDECTOMY     BACK SURGERY     BIOPSY  03/30/2022   Procedure: BIOPSY;  Surgeon: Tomma Lightning,  MD;  Location: [facility];  Service: Pulmonary;;   BRONCHIAL WASHINGS  03/30/2022   Procedure: BRONCHIAL WASHINGS;  Surgeon: Tomma Lightning, MD;  Location: [facility];  Service: Pulmonary;;   CHOLECYSTECTOMY     COLONOSCOPY N/A 06/02/2013   Procedure: COLONOSCOPY;  Surgeon: Graylin Shiver, MD;  Location: [facility];  Service: Endoscopy;  Laterality: N/A;   HERNIA REPAIR  2000   INCISION AND DRAINAGE ABSCESS Left 07/07/2016   Procedure: DEBRIDMENT LEFT THIGH ABSCESS, EXISION ACUTE SKIN RASH LEFT THIGH(1CM LESION);  Surgeon: Claud Kelp, MD;  Location: [facility];  Service: General;  Laterality: Left;   IR FLUORO GUIDE CV LINE RIGHT  04/24/2022   IR IVC FILTER PLMT / S&I /IMG GUID/MOD SED  05/17/2022   IR US GUIDE VASC ACCESS RIGHT  04/24/2022   SEPTOPLASTY  02/2013   spleen anuyism     splenic aneurysm     TRANSMETATARSAL AMPUTATION Bilateral 10/21/2020   Procedure: Left transmetatarsal amputation;  Surgeon: Toni Arthurs, MD;  Location: [facility];  Service: Orthopedics;  Laterality: Bilateral;   TRANSMETATARSAL AMPUTATION Left 03/24/2021   Procedure: TRANSMETATARSAL AMPUTATION;  Surgeon: Toni Arthurs, MD;  Location: [facility];  Service: Orthopedics;  Laterality: Left;   VAGINAL HYSTERECTOMY     VIDEO BRONCHOSCOPY Right 03/30/2022   Procedure: VIDEO BRONCHOSCOPY WITH FLUORO;  Surgeon:  Tomma Lightning, MD;  Location: [facility];  Service: Pulmonary;  Laterality: Right;  atypical pneumonia   Patient Active Problem List   Diagnosis Date Noted   Cervical cancer screening 02/05/2023   Odynophagia 01/31/2023   Edema, peripheral 01/31/2023   Sleep disturbance 01/02/2023   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) 12/13/2022   Tinea of groin 09/30/2022   Need for immunization against influenza 09/30/2022   Aortic atherosclerosis (HCC) 06/29/2022   Type 2 diabetes mellitus (HCC) 06/29/2022   Acute deep vein thrombosis (DVT) of left lower extremity (HCC) 05/17/2022    Rhabdomyolysis 04/18/2022   Transaminitis 04/17/2022   Recurrent pneumonia 04/12/2022   Pulmonary embolism (HCC) 03/28/2022   Severe sepsis (HCC) 03/26/2022   UTI (urinary tract infection) 03/26/2022   Opioid dependence (HCC) 03/26/2022   Acute metabolic encephalopathy 03/26/2022   Hip pain 02/20/2022   Arthritis of right hip 02/19/2022   Hypothyroidism    AKI (acute kidney injury) (HCC)    Class 2 obesity    Depression    Essential hypertension    Acute cystitis without hematuria 01/04/2022   Dysfunction of left rotator cuff 01/04/2022   Lobar pneumonia (HCC) 01/04/2022   Acquired absence of other toe(s), unspecified side (HCC) 08/01/2021   Myositis of right lower extremity 07/01/2021   Tear of gluteus medius tendon 07/01/2021   Left wrist pain 06/01/2021   Medication monitoring encounter 05/10/2021   Chronic pain 03/24/2021   COVID-19 virus infection 11/01/2020   Stage 3a chronic kidney disease (HCC) 09/08/2019   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   Steroid-induced hyperglycemia 05/11/2018   Morbid obesity (HCC) 01/25/2018   Neuropathy 01/25/2018   Presence of intrathecal pump 08/02/2017   Rectocele 03/09/2017   Abnormal finding on imaging 12/19/2016   Esophageal dysphagia 12/19/2016   Current chronic use of systemic steroids 10/26/2016   IFG (impaired fasting glucose) 10/26/2016   Arthralgia of multiple joints 07/15/2016   Headache 07/15/2016   Peripheral neuropathy 07/04/2016   Rash of body 07/04/2016   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   Neuropathic pain of both feet 01/11/2016   OSA (obstructive sleep apnea) 12/29/2015   Chronic insomnia 12/29/2015   Obesity, Class II, BMI 35-39.9, with comorbidity 12/29/2015   Polyneuropathy associated with underlying disease (HCC) 12/29/2015   Class 2 drug-induced obesity with serious comorbidity and body mass index (BMI) of 35.0 to 35.9 in adult 12/29/2015   Other mechanical complication of  implanted electronic neurostimulator of spinal cord electrode (lead), initial encounter (HCC) 08/04/2015   Adjustment disorder with depressed mood 03/29/2015   Mononeuritis 07/20/2014   Obesity (BMI 30-39.9) 07/20/2014   Neurocardiogenic syncope 07/06/2014   Normocytic anemia 06/08/2014   POTS (postural orthostatic tachycardia syndrome) 06/08/2014   Encounter for long-term (current) use of high-risk medication 05/30/2013   Fall 05/30/2013   Other long term (current) drug therapy 05/30/2013   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/30/2013   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/2013   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   REFERRING DIAG: E11.621,L97.512 (ICD-10-CM) - Diabetic ulcer of toe of right foot  associated with type 2 diabetes mellitus, with fat layer exposed (HCC)  THERAPY DIAG:  Difficulty in walking   Rationale for Evaluation and Treatment: Rehabilitation     Wound Therapy - 02/22/23 0001     Subjective Pt states that the wound continues to drain thru her sock    Patient and Family Stated Goals wounds to heal    Date of Onset 08/22/22    Prior Treatments self care    Pain Scale 0-10    Pain Score 4     Pain Type Chronic pain    Pain Location Foot    Pain Orientation Right    Pain Onset On-going    Patients Stated Pain Goal 3    Pain Intervention(s) Emotional support    Evaluation and Treatment Procedures Explained to Patient/Family Yes    Evaluation and Treatment Procedures agreed to     Wound Properties Date First Assessed: 01/04/23 Time First Assessed: 1355   Dressing Type Compression wrap;Honey;Impregnated gauze (bismuth)    Dressing Status Clean;Dry    Dressing Change Frequency PRN    Site / Wound Assessment Granulation tissue;Yellow    % Wound base Red or Granulating 95%    % Wound base Yellow/Fibrinous Exudate 5%    Peri-wound Assessment --   callous perimeter   Wound Length (cm) 0.7 cm    Wound Width (cm) 1.2 cm    Wound Depth (cm) 0.3 cm    Wound Volume (cm^3) 0.25 cm^3    Wound Surface Area (cm^2) 0.84 cm^2    Drainage Amount Minimal    Drainage Description Serous    Treatment Cleansed;Debridement (Selective)    Selective Debridement (non-excisional) - Location edges of the wound    Selective Debridement (non-excisional) - Tools Used Forceps;Scalpel    Selective Debridement (non-excisional) - Tissue Removed callous    Wound Therapy - Clinical Statement see below    Wound Therapy - Functional Problem List unable to wear shoes , difficult with walking    Factors Delaying/Impairing Wound Healing Altered sensation;Diabetes Mellitus;Multiple medical problems;Polypharmacy    Hydrotherapy Plan Debridement;Dressing change;Patient/family education    Wound Therapy - Frequency 2X / week    Wound Therapy - Current Recommendations PT    Wound Plan debride and dressing change    Dressing  Left: instured to wear compression garments.  Measured and given ETI printout    Dressing trial of silverhydrofiber, 4x4, kerlix, cotton for shaping and coban followed by netting.               PATIENT EDUCATION: Education details: Dressing should not be painful take off if pt has increased pain.  Keep dressing dry take off if it gets wet Person educated: Patient Education method: Explanation Education comprehension: verbalized understanding   HOME EXERCISE PROGRAM: None issued   GOALS: Goals reviewed with patient? Yes  SHORT TERM GOALS: Target date: 01/24/2023  Other  7 wounds beside the main wound to be healed  Baseline: Goal status: MET  2.  Pt to be wearing Darco boot to avoid pressure on wound  Baseline:  Goal status: REVISED; Pt unable to use due to amputee     LONG TERM GOALS: Target date: 02/15/2023  Wound to have no depth and be no greater than .5x .5cm in size to allow pt to feel comfortable with self care. Baseline:  Goal status: IN PROGRESS  2.  PT pain to be no greater than a 2/10 Baseline:  Goal status: IN PROGRESS    ASSESSMENT:  CLINICAL IMPRESSION: Therapist changed dressing to silverhydrofiber.  Instructed pt to bring her other compression garment as we can dress the wound then don the compression garment.  Wound appears to be getting larger but this is due to the callous surrounding the wound that is being debrided.  Pt will continue to need skilled care to debride the callous at which time pt can be discharged to self care.   OBJECTIVE IMPAIRMENTS: difficulty walking, obesity, pain, and decreased skin integrity .   ACTIVITY LIMITATIONS: dressing, hygiene/grooming, and locomotion level  PARTICIPATION LIMITATIONS: shopping and community activity  PERSONAL FACTORS: Fitness, Time since onset of injury/illness/exacerbation, and 1-2 comorbidities: obesity, DM, Lt transmetatarsal amputation  are also affecting patient's functional outcome.   REHAB POTENTIAL: Good  CLINICAL DECISION MAKING: Stable/uncomplicated  EVALUATION COMPLEXITY: moderate  PLAN: PT FREQUENCY: 2x/week  PT DURATION: 6 weeks; continue for 6 more weeks until Rt foot ulcer is healed.  PLANNED INTERVENTIONS: Patient/Family education, Self Care, and debridement and dressing change.  PLAN FOR NEXT SESSION: Continue to monitor wound debride and dress as appropriate.   Virgina Organ, PT CLT [phone]

## 2023-02-27 ENCOUNTER — Ambulatory Visit (HOSPITAL_COMMUNITY): Payer: BC Managed Care – PPO

## 2023-02-27 ENCOUNTER — Encounter (HOSPITAL_COMMUNITY): Payer: Self-pay

## 2023-02-27 DIAGNOSIS — S91301D Unspecified open wound, right foot, subsequent encounter: Secondary | ICD-10-CM

## 2023-02-27 DIAGNOSIS — R262 Difficulty in walking, not elsewhere classified: Secondary | ICD-10-CM

## 2023-02-27 DIAGNOSIS — E11621 Type 2 diabetes mellitus with foot ulcer: Secondary | ICD-10-CM | POA: Diagnosis not present

## 2023-02-27 DIAGNOSIS — S91301A Unspecified open wound, right foot, initial encounter: Secondary | ICD-10-CM

## 2023-02-27 NOTE — Therapy (Signed)
OUTPATIENT PHYSICAL THERAPY Wound Treatment   Patient Name: [name] MRN: [id number] DOB:[birth year], 57 y.o., female Today's Date: 02/27/2023  PCP: [name], [name] REFERRING PROVIDER: [name], [name]  END OF SESSION:   PT End of Session - 02/27/23 0831     Visit Number 13    Number of Visits 23    Date for PT Re-Evaluation 03/30/23    Authorization Type [employer]    Progress Note Due on Visit 20    PT Start Time 0734    PT Stop Time 0805    PT Time Calculation (min) 31 min    Activity Tolerance Patient tolerated treatment well    Behavior During Therapy [facility] for tasks assessed/performed             Past Medical History:  Diagnosis Date   Chronic cough    Chronic pain    Diabetes mellitus without complication (HCC)    Dyspnea    GERD (gastroesophageal reflux disease)    Hypokalemia    Hypothyroidism    Left wrist pain 06/01/2021   Myonecrosis (HCC) 07/01/2021   Neurocardiogenic syncope    OSA (obstructive sleep apnea)    does not use CPAP   Osteomyelitis (HCC)    bilateral feet   Peripheral neuropathy    Presence of intrathecal pump    recieves Prialt/bupivicaine   Tear of gluteus medius tendon 07/01/2021   Wegener's granulomatosis 2009   Wegner's disease (congenital syphilitic osteochondritis)    Past Surgical History:  Procedure Laterality Date   AMPUTATION Right 10/21/2020   Procedure: Right hallux amputation;  Surgeon: Toni Arthurs, MD;  Location: [facility];  Service: Orthopedics;  Laterality: Right;   AMPUTATION TOE Bilateral 08/12/2020   Procedure: Right 3rd toe amputation; left hallux and 3rd toe amputation;  Surgeon: Toni Arthurs, MD;  Location: [facility];  Service: Orthopedics;  Laterality: Bilateral;    AMPUTATION TOE Right 03/24/2021   Procedure: AMPUTATION TOE;  Surgeon: Toni Arthurs, MD;  Location: [facility];  Service: Orthopedics;  Laterality: Right;   APPENDECTOMY     BACK SURGERY     BIOPSY   03/30/2022   Procedure: BIOPSY;  Surgeon: Tomma Lightning, MD;  Location: [facility];  Service: Pulmonary;;   BRONCHIAL WASHINGS  03/30/2022   Procedure: BRONCHIAL WASHINGS;  Surgeon: Tomma Lightning, MD;  Location: [facility];  Service: Pulmonary;;   CHOLECYSTECTOMY     COLONOSCOPY N/A 06/02/2013   Procedure: COLONOSCOPY;  Surgeon: Graylin Shiver, MD;  Location: [facility];  Service: Endoscopy;  Laterality: N/A;   HERNIA REPAIR  2000   INCISION AND DRAINAGE ABSCESS Left 07/07/2016   Procedure: DEBRIDMENT LEFT THIGH ABSCESS, EXISION ACUTE SKIN RASH LEFT THIGH(1CM LESION);  Surgeon: Claud Kelp, MD;  Location: [facility];  Service: General;  Laterality: Left;   IR FLUORO GUIDE CV LINE RIGHT  04/24/2022   IR IVC FILTER PLMT / S&I /IMG GUID/MOD SED  05/17/2022   IR US GUIDE VASC ACCESS RIGHT  04/24/2022   SEPTOPLASTY  02/2013   spleen anuyism     splenic aneurysm     TRANSMETATARSAL AMPUTATION Bilateral 10/21/2020   Procedure: Left transmetatarsal amputation;  Surgeon: Toni Arthurs, MD;  Location: [facility];  Service: Orthopedics;  Laterality: Bilateral;   TRANSMETATARSAL AMPUTATION Left 03/24/2021   Procedure: TRANSMETATARSAL AMPUTATION;  Surgeon: Toni Arthurs, MD;  Location: [facility];  Service: Orthopedics;  Laterality: Left;   VAGINAL HYSTERECTOMY     VIDEO  BRONCHOSCOPY Right 03/30/2022   Procedure: VIDEO BRONCHOSCOPY WITH FLUORO;  Surgeon: Tomma Lightning, MD;  Location: [facility];  Service: Pulmonary;  Laterality: Right;  atypical pneumonia   Patient Active Problem List   Diagnosis Date Noted   Cervical cancer screening 02/05/2023   Odynophagia 01/31/2023   Edema, peripheral 01/31/2023   Sleep disturbance 01/02/2023   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) 12/13/2022   Tinea of groin 09/30/2022   Need for immunization against influenza 09/30/2022   Aortic atherosclerosis (HCC) 06/29/2022   Type 2 diabetes mellitus (HCC) 06/29/2022   Acute deep vein  thrombosis (DVT) of left lower extremity (HCC) 05/17/2022   Rhabdomyolysis 04/18/2022   Transaminitis 04/17/2022   Recurrent pneumonia 04/12/2022   Pulmonary embolism (HCC) 03/28/2022   Severe sepsis (HCC) 03/26/2022   UTI (urinary tract infection) 03/26/2022   Opioid dependence (HCC) 03/26/2022   Acute metabolic encephalopathy 03/26/2022   Hip pain 02/20/2022   Arthritis of right hip 02/19/2022   Hypothyroidism    AKI (acute kidney injury) (HCC)    Class 2 obesity    Depression    Essential hypertension    Acute cystitis without hematuria 01/04/2022   Dysfunction of left rotator cuff 01/04/2022   Lobar pneumonia (HCC) 01/04/2022   Acquired absence of other toe(s), unspecified side (HCC) 08/01/2021   Myositis of right lower extremity 07/01/2021   Tear of gluteus medius tendon 07/01/2021   Left wrist pain 06/01/2021   Medication monitoring encounter 05/10/2021   Chronic pain 03/24/2021   COVID-19 virus infection 11/01/2020   Stage 3a chronic kidney disease (HCC) 09/08/2019   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   Steroid-induced hyperglycemia 05/11/2018   Morbid obesity (HCC) 01/25/2018   Neuropathy 01/25/2018   Presence of intrathecal pump 08/02/2017   Rectocele 03/09/2017   Abnormal finding on imaging 12/19/2016   Esophageal dysphagia 12/19/2016   Current chronic use of systemic steroids 10/26/2016   IFG (impaired fasting glucose) 10/26/2016   Arthralgia of multiple joints 07/15/2016   Headache 07/15/2016   Peripheral neuropathy 07/04/2016   Rash of body 07/04/2016   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   Neuropathic pain of both feet 01/11/2016   OSA (obstructive sleep apnea) 12/29/2015   Chronic insomnia 12/29/2015   Obesity, Class II, BMI 35-39.9, with comorbidity 12/29/2015   Polyneuropathy associated with underlying disease (HCC) 12/29/2015   Class 2 drug-induced obesity with serious comorbidity and body mass index (BMI) of 35.0 to  35.9 in adult 12/29/2015   Other mechanical complication of implanted electronic neurostimulator of spinal cord electrode (lead), initial encounter (HCC) 08/04/2015   Adjustment disorder with depressed mood 03/29/2015   Mononeuritis 07/20/2014   Obesity (BMI 30-39.9) 07/20/2014   Neurocardiogenic syncope 07/06/2014   Normocytic anemia 06/08/2014   POTS (postural orthostatic tachycardia syndrome) 06/08/2014   Encounter for long-term (current) use of high-risk medication 05/30/2013   Fall 05/30/2013   Other long term (current) drug therapy 05/30/2013   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/30/2013   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/2013   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  REFERRING DIAG: E11.621,L97.512 (ICD-10-CM) - Diabetic ulcer of toe of right foot associated with type 2 diabetes mellitus, with fat layer exposed (HCC)  THERAPY DIAG:  Difficulty in walking   Rationale for Evaluation and Treatment: Rehabilitation     Wound Therapy - 02/27/23 0001     Subjective Pt stated she had to change dressings yesterday as dressings moved from wound and drained through sock.  Pt wearing compression garments on Lt LE at entrance.    Patient and Family Stated Goals wounds to heal    Date of Onset 08/22/22    Prior Treatments self care    Pain Scale 0-10    Pain Score 4     Pain Type Chronic pain    Pain Location Foot    Pain Orientation Right    Pain Onset On-going    Patients Stated Pain Goal 3    Pain  Intervention(s) Emotional support;Repositioned    Evaluation and Treatment Procedures Explained to Patient/Family Yes    Evaluation and Treatment Procedures agreed to    Wound Properties Date First Assessed: 01/04/23 Time First Assessed: 1355   Dressing Type Gauze (Comment);Silver hydrofiber   vaseline perimeter, silverhydrofiber, 2x2, medipore tape, kerlix   Dressing Status Clean;Dry    Dressing Change Frequency PRN    Site / Wound Assessment Granulation tissue;Yellow    % Wound base Red or Granulating 95%    % Wound base Yellow/Fibrinous Exudate 5%    Peri-wound Assessment --   callous   Drainage Amount Minimal    Drainage Description Serous    Treatment Cleansed;Debridement (Selective)    Selective Debridement (non-excisional) - Location edges of the wound    Selective Debridement (non-excisional) - Tools Used Forceps;Scalpel    Selective Debridement (non-excisional) - Tissue Removed callous    Wound Therapy - Clinical Statement see below    Wound Therapy - Functional Problem List unable to wear shoes , difficult with walking    Factors Delaying/Impairing Wound Healing Altered sensation;Diabetes Mellitus;Multiple medical problems;Polypharmacy    Hydrotherapy Plan Debridement;Dressing change;Patient/family education    Wound Therapy - Frequency 2X / week    Wound Therapy - Current Recommendations PT    Wound Plan debride and dressing change    Dressing  WU:[id number] perimeter, silverhydrofiber, 2x2, medipore tape, kerlix               PATIENT EDUCATION: Education details: Dressing should not be painful take off if pt has increased pain.  Keep dressing dry take off if it gets wet Person educated: Patient Education method: Explanation Education comprehension: verbalized understanding   HOME EXERCISE PROGRAM: None issued   GOALS: Goals reviewed with patient? Yes  SHORT TERM GOALS: Target date: 01/24/2023  Other 7 wounds beside the main wound to be healed   Baseline: Goal status: MET  2.  Pt to be wearing Darco boot to avoid pressure on wound  Baseline:  Goal status: REVISED; Pt unable to use due to amputee     LONG TERM GOALS: Target date: 02/15/2023  Wound to have no depth and be no greater than .5x .5cm in size to allow pt to feel comfortable with self care. Baseline:  Goal status: IN PROGRESS  2.  PT pain to be no greater than a 2/10 Baseline:  Goal status: IN PROGRESS    ASSESSMENT:  CLINICAL IMPRESSION: Selective debridement for removal of callous perimeter to promote healing.  Continued with silverhydrofiber with additional medipore tape to keep in place to assist with drainage.  Pt brought compression garments and able to donn independently.  OBJECTIVE IMPAIRMENTS: difficulty walking, obesity, pain, and decreased skin integrity .   ACTIVITY LIMITATIONS: dressing, hygiene/grooming, and locomotion level  PARTICIPATION LIMITATIONS: shopping and community activity  PERSONAL FACTORS: Fitness, Time since onset of injury/illness/exacerbation, and 1-2 comorbidities: obesity, DM, Lt transmetatarsal amputation  are also affecting patient's functional outcome.   REHAB POTENTIAL: Good  CLINICAL DECISION MAKING: Stable/uncomplicated  EVALUATION COMPLEXITY: moderate  PLAN: PT FREQUENCY: 2x/week  PT DURATION: 6 weeks; continue for 6 more weeks until Rt foot ulcer is healed.  PLANNED INTERVENTIONS: Patient/Family education, Self Care, and debridement and dressing change.  PLAN FOR NEXT SESSION: Continue to monitor wound debride and dress as appropriate.   Becky Sax, LPTA/CLT; [facility] [phone]

## 2023-03-01 ENCOUNTER — Ambulatory Visit (HOSPITAL_COMMUNITY): Payer: BC Managed Care – PPO

## 2023-03-02 NOTE — Patient Instructions (Signed)
[name]  03/02/2023     @PREFPERIOPPHARMACY @   Your procedure is scheduled on  03/07/2023.   Report to [facility] at  0800  A.M.   Call this number if you have problems the morning of surgery:  [phone]  If you experience any cold or flu symptoms such as cough, fever, chills, shortness of breath, etc. between now and your scheduled surgery, please notify us at the above number.   Remember:  Follow the diet and prep instructions given to you by the office.     DO NOT take any medications for diabetes the morning of your procedure.     Take these medicines the morning of surgery with A SIP OF WATER        levothyroxine, robaxin, pantoprazole, topamax.     Do not wear jewelry, make-up or nail polish.  Do not wear lotions, powders, or perfumes, or deodorant.  Do not shave 48 hours prior to surgery.  Men may shave face and neck.  Do not bring valuables to the hospital.  [facility] is not responsible for any belongings or valuables.  Contacts, dentures or bridgework may not be worn into surgery.  Leave your suitcase in the car.  After surgery it may be brought to your room.  For patients admitted to the hospital, discharge time will be determined by your treatment team.  Patients discharged the day of surgery will not be allowed to drive home and must have someone with them for 24 hours.    Special instructions:   DO NOT smoke tobacco or vape for 24 hours before your procedure.  Please read over the following fact sheets that you were given. Anesthesia Post-op Instructions and Care and Recovery After Surgery      Upper Endoscopy, Adult, Care After After the procedure, it is common to have a sore throat. It is also common to have: Mild stomach pain or discomfort. Bloating. Nausea. Follow these instructions at home: The instructions below may help you care for yourself at home. Your health care provider may give you more instructions. If you have  questions, ask your health care provider. If you were given a sedative during the procedure, it can affect you for several hours. Do not drive or operate machinery until your health care provider says that it is safe. If you will be going home right after the procedure, plan to have a responsible adult: Take you home from the hospital or clinic. You will not be allowed to drive. Care for you for the time you are told. Follow instructions from your health care provider about what you may eat and drink. Return to your normal activities as told by your health care provider. Ask your health care provider what activities are safe for you. Take over-the-counter and prescription medicines only as told by your health care provider. Contact a health care provider if you: Have a sore throat that lasts longer than one day. Have trouble swallowing. Have a fever. Get help right away if you: Vomit blood or your vomit looks like coffee grounds. Have bloody, black, or tarry stools. Have a very bad sore throat or you cannot swallow. Have difficulty breathing or very bad pain in your chest or abdomen. These symptoms may be an emergency. Get help right away. Call 911. Do not wait to see if the symptoms will go away. Do not drive yourself to the hospital. Summary After the procedure, it is  common to have a sore throat, mild stomach discomfort, bloating, and nausea. If you were given a sedative during the procedure, it can affect you for several hours. Do not drive until your health care provider says that it is safe. Follow instructions from your health care provider about what you may eat and drink. Return to your normal activities as told by your health care provider. This information is not intended to replace advice given to you by your health care provider. Make sure you discuss any questions you have with your health care provider. Document Revised: 01/18/2022 Document Reviewed: 01/18/2022 Elsevier  Patient Education  2023 [employer] Esophageal Dilatation Esophageal dilatation, also called esophageal dilation, is a procedure to widen or open a blocked or narrowed part of the esophagus. The esophagus is the part of the body that moves food and liquid from the mouth to the stomach. You may need this procedure if: You have a buildup of scar tissue in your esophagus that makes it difficult, painful, or impossible to swallow. This can be caused by gastroesophageal reflux disease (GERD). You have cancer of the esophagus. There is a problem with how food moves through your esophagus. In some cases, you may need this procedure repeated at a later time to dilate the esophagus gradually. Tell a health care provider about: Any allergies you have. All medicines you are taking, including vitamins, herbs, eye drops, creams, and over-the-counter medicines. Any problems you or family members have had with anesthetic medicines. Any blood disorders you have. Any surgeries you have had. Any medical conditions you have. Any antibiotic medicines you are required to take before dental procedures. Whether you are pregnant or may be pregnant. What are the risks? Generally, this is a safe procedure. However, problems may occur, including: Bleeding due to a tear in the lining of the esophagus. A hole, or perforation, in the esophagus. What happens before the procedure? Ask your health care provider about: Changing or stopping your regular medicines. This is especially important if you are taking diabetes medicines or blood thinners. Taking medicines such as aspirin and ibuprofen. These medicines can thin your blood. Do not take these medicines unless your health care provider tells you to take them. Taking over-the-counter medicines, vitamins, herbs, and supplements. Follow instructions from your health care provider about eating or drinking restrictions. Plan to have a responsible adult take you home from  the hospital or clinic. Plan to have a responsible adult care for you for the time you are told after you leave the hospital or clinic. This is important. What happens during the procedure? You may be given a medicine to help you relax (sedative). A numbing medicine may be sprayed into the back of your throat, or you may gargle the medicine. Your health care provider may perform the dilatation using various surgical instruments, such as: Simple dilators. This instrument is carefully placed in the esophagus to stretch it. Guided wire bougies. This involves using an endoscope to insert a wire into the esophagus. A dilator is passed over this wire to enlarge the esophagus. Then the wire is removed. Balloon dilators. An endoscope with a small balloon is inserted into the esophagus. The balloon is inflated to stretch the esophagus and open it up. The procedure may vary among health care providers and hospitals. What can I expect after the procedure? Your blood pressure, heart rate, breathing rate, and blood oxygen level will be monitored until you leave the hospital or clinic. Your throat may feel slightly  sore and numb. This will get better over time. You will not be allowed to eat or drink until your throat is no longer numb. When you are able to drink, urinate, and sit on the edge of the bed without nausea or dizziness, you may be able to return home. Follow these instructions at home: Take over-the-counter and prescription medicines only as told by your health care provider. If you were given a sedative during the procedure, it can affect you for several hours. Do not drive or operate machinery until your health care provider says that it is safe. Plan to have a responsible adult care for you for the time you are told. This is important. Follow instructions from your health care provider about any eating or drinking restrictions. Do not use any products that contain nicotine or tobacco, such as  cigarettes, e-cigarettes, and chewing tobacco. If you need help quitting, ask your health care provider. Keep all follow-up visits. This is important. Contact a health care provider if: You have a fever. You have pain that is not relieved by medicine. Get help right away if: You have chest pain. You have trouble breathing. You have trouble swallowing. You vomit blood. You have black, tarry, or bloody stools. These symptoms may represent a serious problem that is an emergency. Do not wait to see if the symptoms will go away. Get medical help right away. Call your local emergency services (911 in the [place] Do not drive yourself to the hospital. Summary Esophageal dilatation, also called esophageal dilation, is a procedure to widen or open a blocked or narrowed part of the esophagus. Plan to have a responsible adult take you home from the hospital or clinic. For this procedure, a numbing medicine may be sprayed into the back of your throat, or you may gargle the medicine. Do not drive or operate machinery until your health care provider says that it is safe. This information is not intended to replace advice given to you by your health care provider. Make sure you discuss any questions you have with your health care provider. Document Revised: 02/25/2020 Document Reviewed: 02/25/2020 Elsevier Patient Education  2023 [employer] Colonoscopy, Adult, Care After The following information offers guidance on how to care for yourself after your procedure. Your health care provider may also give you more specific instructions. If you have problems or questions, contact your health care provider. What can I expect after the procedure? After the procedure, it is common to have: A small amount of blood in your stool for 24 hours after the procedure. Some gas. Mild cramping or bloating of your abdomen. Follow these instructions at home: Eating and drinking  Drink enough fluid to keep your urine  pale yellow. Follow instructions from your health care provider about eating or drinking restrictions. Resume your normal diet as told by your health care provider. Avoid heavy or fried foods that are hard to digest. Activity Rest as told by your health care provider. Avoid sitting for a long time without moving. Get up to take short walks every 1-2 hours. This is important to improve blood flow and breathing. Ask for help if you feel weak or unsteady. Return to your normal activities as told by your health care provider. Ask your health care provider what activities are safe for you. Managing cramping and bloating  Try walking around when you have cramps or feel bloated. If directed, apply heat to your abdomen as told by your health care provider. Use the heat  source that your health care provider recommends, such as a moist heat pack or a heating pad. Place a towel between your skin and the heat source. Leave the heat on for 20-30 minutes. Remove the heat if your skin turns bright red. This is especially important if you are unable to feel pain, heat, or cold. You have a greater risk of getting burned. General instructions If you were given a sedative during the procedure, it can affect you for several hours. Do not drive or operate machinery until your health care provider says that it is safe. For the first 24 hours after the procedure: Do not sign important documents. Do not drink alcohol. Do your regular daily activities at a slower pace than normal. Eat soft foods that are easy to digest. Take over-the-counter and prescription medicines only as told by your health care provider. Keep all follow-up visits. This is important. Contact a health care provider if: You have blood in your stool 2-3 days after the procedure. Get help right away if: You have more than a small spotting of blood in your stool. You have large blood clots in your stool. You have swelling of your abdomen. You  have nausea or vomiting. You have a fever. You have increasing pain in your abdomen that is not relieved with medicine. These symptoms may be an emergency. Get help right away. Call 911. Do not wait to see if the symptoms will go away. Do not drive yourself to the hospital. Summary After the procedure, it is common to have a small amount of blood in your stool. You may also have mild cramping and bloating of your abdomen. If you were given a sedative during the procedure, it can affect you for several hours. Do not drive or operate machinery until your health care provider says that it is safe. Get help right away if you have a lot of blood in your stool, nausea or vomiting, a fever, or increased pain in your abdomen. This information is not intended to replace advice given to you by your health care provider. Make sure you discuss any questions you have with your health care provider. Document Revised: 06/01/2021 Document Reviewed: 06/01/2021 Elsevier Patient Education  2023 [employer] Monitored Anesthesia Care, Care After The following information offers guidance on how to care for yourself after your procedure. Your health care provider may also give you more specific instructions. If you have problems or questions, contact your health care provider. What can I expect after the procedure? After the procedure, it is common to have: Tiredness. Little or no memory about what happened during or after the procedure. Impaired judgment when it comes to making decisions. Nausea or vomiting. Some trouble with balance. Follow these instructions at home: For the time period you were told by your health care provider:  Rest. Do not participate in activities where you could fall or become injured. Do not drive or use machinery. Do not drink alcohol. Do not take sleeping pills or medicines that cause drowsiness. Do not make important decisions or sign legal documents. Do not take care of  children on your own. Medicines Take over-the-counter and prescription medicines only as told by your health care provider. If you were prescribed antibiotics, take them as told by your health care provider. Do not stop using the antibiotic even if you start to feel better. Eating and drinking Follow instructions from your health care provider about what you may eat and drink. Drink enough fluid to keep  your urine pale yellow. If you vomit: Drink clear fluids slowly and in small amounts as you are able. Clear fluids include water, ice chips, low-calorie sports drinks, and fruit juice that has water added to it (diluted fruit juice). Eat light and bland foods in small amounts as you are able. These foods include bananas, applesauce, rice, lean meats, toast, and crackers. General instructions  Have a responsible adult stay with you for the time you are told. It is important to have someone help care for you until you are awake and alert. If you have sleep apnea, surgery and some medicines can increase your risk for breathing problems. Follow instructions from your health care provider about wearing your sleep device: When you are sleeping. This includes during daytime naps. While taking prescription pain medicines, sleeping medicines, or medicines that make you drowsy. Do not use any products that contain nicotine or tobacco. These products include cigarettes, chewing tobacco, and vaping devices, such as e-cigarettes. If you need help quitting, ask your health care provider. Contact a health care provider if: You feel nauseous or vomit every time you eat or drink. You feel light-headed. You are still sleepy or having trouble with balance after 24 hours. You get a rash. You have a fever. You have redness or swelling around the IV site. Get help right away if: You have trouble breathing. You have new confusion after you get home. These symptoms may be an emergency. Get help right away. Call  911. Do not wait to see if the symptoms will go away. Do not drive yourself to the hospital. This information is not intended to replace advice given to you by your health care provider. Make sure you discuss any questions you have with your health care provider. Document Revised: 03/06/2022 Document Reviewed: 03/06/2022 Elsevier Patient Education  2023 [employer].

## 2023-03-05 ENCOUNTER — Encounter (HOSPITAL_COMMUNITY): Payer: Self-pay

## 2023-03-05 ENCOUNTER — Encounter (HOSPITAL_COMMUNITY)
Admission: RE | Admit: 2023-03-05 | Discharge: 2023-03-05 | Disposition: A | Discharge status: Home / Self Care | Payer: BC Managed Care – PPO | Source: Ambulatory Visit | Attending: Internal Medicine | Admitting: Internal Medicine

## 2023-03-05 ENCOUNTER — Telehealth: Payer: Self-pay | Admitting: *Deleted

## 2023-03-05 DIAGNOSIS — J189 Pneumonia, unspecified organism: Secondary | ICD-10-CM

## 2023-03-05 NOTE — Telephone Encounter (Signed)
-----   Message from Lillia Mountain, RN sent at 03/05/2023  2:17 PM EDT ----- Regarding: No show for PAT Stacey Maxin,  Ms Spitale was a no show for her PAT  Thanks,  Cliffton Asters RN

## 2023-03-05 NOTE — Telephone Encounter (Signed)
Called pt. She stated she was out of town and was going to cancel procedure for now. She will call back to reschedule. Message sent to endo.

## 2023-03-07 ENCOUNTER — Encounter (HOSPITAL_COMMUNITY): Admission: RE | Payer: Self-pay | Source: Home / Self Care

## 2023-03-07 ENCOUNTER — Ambulatory Visit (HOSPITAL_COMMUNITY): Payer: BC Managed Care – PPO | Admitting: Physical Therapy

## 2023-03-07 ENCOUNTER — Ambulatory Visit (HOSPITAL_COMMUNITY)
Admission: RE | Admit: 2023-03-07 | Payer: BC Managed Care – PPO | Source: Home / Self Care | Admitting: Internal Medicine

## 2023-03-07 SURGERY — COLONOSCOPY WITH PROPOFOL
Anesthesia: Monitor Anesthesia Care

## 2023-03-08 ENCOUNTER — Other Ambulatory Visit: Payer: Self-pay | Admitting: Family Medicine

## 2023-03-08 ENCOUNTER — Other Ambulatory Visit: Payer: Self-pay

## 2023-03-08 DIAGNOSIS — G479 Sleep disorder, unspecified: Secondary | ICD-10-CM

## 2023-03-08 MED ORDER — AMITRIPTYLINE HCL 50 MG PO TABS
ORAL_TABLET | ORAL | 0 refills | Status: DC
Start: 2023-03-08 — End: 2023-04-12

## 2023-03-08 MED ORDER — TOPIRAMATE 100 MG PO TABS
ORAL_TABLET | ORAL | 0 refills | Status: DC
Start: 2023-03-08 — End: 2023-04-12

## 2023-03-15 ENCOUNTER — Ambulatory Visit (HOSPITAL_COMMUNITY): Payer: BC Managed Care – PPO | Admitting: Physical Therapy

## 2023-03-15 DIAGNOSIS — E11621 Type 2 diabetes mellitus with foot ulcer: Secondary | ICD-10-CM | POA: Diagnosis not present

## 2023-03-15 DIAGNOSIS — R262 Difficulty in walking, not elsewhere classified: Secondary | ICD-10-CM

## 2023-03-15 DIAGNOSIS — S91301D Unspecified open wound, right foot, subsequent encounter: Secondary | ICD-10-CM

## 2023-03-15 NOTE — Therapy (Signed)
OUTPATIENT PHYSICAL THERAPY Wound Treatment   Patient Name: [name] MRN: [id number] DOB:14-Mar-1966, 57 y.o., female Today's Date: 03/15/2023  PCP: [name], [name] REFERRING PROVIDER: [name], [name]  END OF SESSION:   PT End of Session - 03/15/23 0840     Visit Number 14    Number of Visits 23    Date for PT Re-Evaluation 03/30/23    Authorization Type BCBS    Progress Note Due on Visit 20    PT Start Time 0815    PT Stop Time 0835    PT Time Calculation (min) 20 min    Activity Tolerance Patient tolerated treatment well    Behavior During Therapy [facility] for tasks assessed/performed             Past Medical History:  Diagnosis Date   Chronic cough    Chronic pain    Diabetes mellitus without complication (HCC)    Dyspnea    GERD (gastroesophageal reflux disease)    Hypokalemia    Hypothyroidism    Left wrist pain 06/01/2021   Myonecrosis (HCC) 07/01/2021   Neurocardiogenic syncope    OSA (obstructive sleep apnea)    does not use CPAP   Osteomyelitis (HCC)    bilateral feet   Peripheral neuropathy    Presence of intrathecal pump    recieves Prialt/bupivicaine   Tear of gluteus medius tendon 07/01/2021   Wegener's granulomatosis 2009   Wegner's disease (congenital syphilitic osteochondritis)    Past Surgical History:  Procedure Laterality Date   AMPUTATION Right 10/21/2020   Procedure: Right hallux amputation;  Surgeon: Toni Arthurs, MD;  Location: [facility];  Service: Orthopedics;  Laterality: Right;   AMPUTATION TOE Bilateral 08/12/2020   Procedure: Right 3rd toe amputation; left hallux and 3rd toe amputation;  Surgeon: Toni Arthurs, MD;  Location: [facility];  Service: Orthopedics;  Laterality: Bilateral;    AMPUTATION TOE Right 03/24/2021   Procedure: AMPUTATION TOE;  Surgeon: Toni Arthurs, MD;  Location: [facility];  Service: Orthopedics;  Laterality: Right;   APPENDECTOMY     BACK SURGERY      BIOPSY  03/30/2022   Procedure: BIOPSY;  Surgeon: Tomma Lightning, MD;  Location: [facility];  Service: Pulmonary;;   BRONCHIAL WASHINGS  03/30/2022   Procedure: BRONCHIAL WASHINGS;  Surgeon: Tomma Lightning, MD;  Location: [facility];  Service: Pulmonary;;   CHOLECYSTECTOMY     COLONOSCOPY N/A 06/02/2013   Procedure: COLONOSCOPY;  Surgeon: Graylin Shiver, MD;  Location: [facility];  Service: Endoscopy;  Laterality: N/A;   HERNIA REPAIR  2000   INCISION AND DRAINAGE ABSCESS Left 07/07/2016   Procedure: DEBRIDMENT LEFT THIGH ABSCESS, EXISION ACUTE SKIN RASH LEFT THIGH(1CM LESION);  Surgeon: Claud Kelp, MD;  Location: [facility];  Service: General;  Laterality: Left;   IR FLUORO GUIDE CV LINE RIGHT  04/24/2022   IR IVC FILTER PLMT / S&I /IMG GUID/MOD SED  05/17/2022   IR US GUIDE VASC ACCESS RIGHT  04/24/2022   SEPTOPLASTY  02/2013   spleen anuyism     splenic aneurysm     TRANSMETATARSAL AMPUTATION Bilateral 10/21/2020   Procedure: Left transmetatarsal amputation;  Surgeon: Toni Arthurs, MD;  Location: [facility];  Service: Orthopedics;  Laterality: Bilateral;   TRANSMETATARSAL AMPUTATION Left 03/24/2021   Procedure: TRANSMETATARSAL AMPUTATION;  Surgeon: Toni Arthurs, MD;  Location: [facility];  Service: Orthopedics;  Laterality: Left;   VAGINAL HYSTERECTOMY     VIDEO  BRONCHOSCOPY Right 03/30/2022   Procedure: VIDEO BRONCHOSCOPY WITH FLUORO;  Surgeon: Tomma Lightning, MD;  Location: [facility];  Service: Pulmonary;  Laterality: Right;  atypical pneumonia   Patient Active Problem List   Diagnosis Date Noted   Cervical cancer screening 02/05/2023   Odynophagia 01/31/2023   Edema, peripheral 01/31/2023   Sleep disturbance 01/02/2023   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) 12/13/2022   Tinea of groin 09/30/2022   Need for immunization against influenza 09/30/2022   Aortic atherosclerosis (HCC) 06/29/2022   Type 2 diabetes mellitus (HCC) 06/29/2022   Acute deep vein  thrombosis (DVT) of left lower extremity (HCC) 05/17/2022   Rhabdomyolysis 04/18/2022   Transaminitis 04/17/2022   Recurrent pneumonia 04/12/2022   Pulmonary embolism (HCC) 03/28/2022   Severe sepsis (HCC) 03/26/2022   UTI (urinary tract infection) 03/26/2022   Opioid dependence (HCC) 03/26/2022   Acute metabolic encephalopathy 03/26/2022   Hip pain 02/20/2022   Arthritis of right hip 02/19/2022   Hypothyroidism    AKI (acute kidney injury) (HCC)    Class 2 obesity    Depression    Essential hypertension    Acute cystitis without hematuria 01/04/2022   Dysfunction of left rotator cuff 01/04/2022   Lobar pneumonia (HCC) 01/04/2022   Acquired absence of other toe(s), unspecified side (HCC) 08/01/2021   Myositis of right lower extremity 07/01/2021   Tear of gluteus medius tendon 07/01/2021   Left wrist pain 06/01/2021   Medication monitoring encounter 05/10/2021   Chronic pain 03/24/2021   COVID-19 virus infection 11/01/2020   Stage 3a chronic kidney disease (HCC) 09/08/2019   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   Steroid-induced hyperglycemia 05/11/2018   Morbid obesity (HCC) 01/25/2018   Neuropathy 01/25/2018   Presence of intrathecal pump 08/02/2017   Rectocele 03/09/2017   Abnormal finding on imaging 12/19/2016   Esophageal dysphagia 12/19/2016   Current chronic use of systemic steroids 10/26/2016   IFG (impaired fasting glucose) 10/26/2016   Arthralgia of multiple joints 07/15/2016   Headache 07/15/2016   Peripheral neuropathy 07/04/2016   Rash of body 07/04/2016   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   Neuropathic pain of both feet 01/11/2016   OSA (obstructive sleep apnea) 12/29/2015   Chronic insomnia 12/29/2015   Obesity, Class II, BMI 35-39.9, with comorbidity 12/29/2015   Polyneuropathy associated with underlying disease (HCC) 12/29/2015   Class 2 drug-induced obesity with serious comorbidity and body mass index (BMI) of 35.0 to  35.9 in adult 12/29/2015   Other mechanical complication of implanted electronic neurostimulator of spinal cord electrode (lead), initial encounter (HCC) 08/04/2015   Adjustment disorder with depressed mood 03/29/2015   Mononeuritis 07/20/2014   Obesity (BMI 30-39.9) 07/20/2014   Neurocardiogenic syncope 07/06/2014   Normocytic anemia 06/08/2014   POTS (postural orthostatic tachycardia syndrome) 06/08/2014   Encounter for long-term (current) use of high-risk medication 05/30/2013   Fall 05/30/2013   Other long term (current) drug therapy 05/30/2013   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/30/2013   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/2013   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  REFERRING DIAG: E11.621,L97.512 (ICD-10-CM) - Diabetic ulcer of toe of right foot associated with type 2 diabetes mellitus, with fat layer exposed (HCC)  THERAPY DIAG:  Difficulty in walking   Rationale for Evaluation and Treatment: Rehabilitation     Wound Therapy - 03/15/23 0847     Subjective pt states she was not able to make it last week so has been 2 weeks since last visit.  Comes today with bandaid secured over wound.  Reports she also has wounds on her Lt LE but does not have an order to treat them.    Patient and Family Stated Goals wounds to heal    Date of Onset 08/22/22    Prior Treatments self care    Pain Scale 0-10    Evaluation and Treatment Procedures Explained to Patient/Family Yes    Evaluation and Treatment  Procedures agreed to    Wound Properties Date First Assessed: 01/04/23 Time First Assessed: 1355   Wound Image Images linked: 1    Dressing Type Gauze (Comment);Silver hydrofiber   vaseline perimeter, silverhydrofiber, 2x2, medipore tape, kerlix   Dressing Changed Changed    Dressing Status Old drainage    Dressing Change Frequency PRN    Site / Wound Assessment Granulation tissue;Yellow    % Wound base Red or Granulating 75%    % Wound base Other/Granulation Tissue (Comment) 25%    Peri-wound Assessment Maceration   callous   Wound Length (cm) 0.7 cm    Wound Width (cm) 1 cm    Wound Depth (cm) 0.3 cm    Wound Volume (cm^3) 0.21 cm^3    Wound Surface Area (cm^2) 0.7 cm^2    Margins Unattached edges (unapproximated)    Drainage Amount Moderate    Drainage Description Serous    Treatment Cleansed;Debridement (Selective)    Selective Debridement (non-excisional) - Location edges of the wound    Selective Debridement (non-excisional) - Tools Used Forceps;Scalpel    Selective Debridement (non-excisional) - Tissue Removed callous    Wound Therapy - Clinical Statement see below    Wound Therapy - Functional Problem List unable to wear shoes , difficult with walking    Factors Delaying/Impairing Wound Healing Altered sensation;Diabetes Mellitus;Multiple medical problems;Polypharmacy    Hydrotherapy Plan Debridement;Dressing change;Patient/family education    Wound Therapy - Frequency 2X / week    Wound Therapy - Current Recommendations PT    Wound Plan debride and dressing change    Dressing  WU:[id number] perimeter, silverhydrofiber, 2x2, medipore tape, kerlix               PATIENT EDUCATION: Education details: Dressing should not be painful take off if pt has increased pain.  Keep dressing dry take off if it gets wet Person educated: Patient Education method: Explanation Education comprehension: verbalized understanding   HOME EXERCISE PROGRAM: None issued   GOALS: Goals  reviewed with patient? Yes  SHORT TERM GOALS: Target date: 01/24/2023  Other 7 wounds beside the main wound to be healed  Baseline: Goal status: MET  2.  Pt to be wearing Darco boot to avoid pressure on wound  Baseline:  Goal status: REVISED; Pt unable to use due to amputee     LONG TERM GOALS: Target date: 02/15/2023  Wound to have no depth and be no greater than .5x .5cm in size to allow pt to feel comfortable with self care. Baseline:  Goal status: IN PROGRESS  2.  PT pain to be no greater than a 2/10 Baseline:  Goal status:  IN PROGRESS    ASSESSMENT:  CLINICAL IMPRESSION: Area macerated with destruction of perimeter of original wound spanning 1.4 X 2.5 area.  Educated patient on how use of bandaid traps the moisture and macerates the borders and how this does not allow approximation.  Instructed to use gauze/medipore rather than a bandaid if she needed to change it between sessions.  Also educated on importance of keeping scheduled appts in order for the wound to heal.  Heavy debridement completed to wound bed and perimeter this session.  Continued with silverhydrofiber with additional medipore tape to keep in place to assist with drainage.  Informed she would have to get an order for her Lt LE if she would like [place] to begin treatment.  Pt brought compression garments and able to donn independently.  OBJECTIVE IMPAIRMENTS: difficulty walking, obesity, pain, and decreased skin integrity .   ACTIVITY LIMITATIONS: dressing, hygiene/grooming, and locomotion level  PARTICIPATION LIMITATIONS: shopping and community activity  PERSONAL FACTORS: Fitness, Time since onset of injury/illness/exacerbation, and 1-2 comorbidities: obesity, DM, Lt transmetatarsal amputation  are also affecting patient's functional outcome.   REHAB POTENTIAL: Good  CLINICAL DECISION MAKING: Stable/uncomplicated  EVALUATION COMPLEXITY: moderate  PLAN: PT FREQUENCY: 2x/week  PT DURATION: 6 weeks;  continue for 6 more weeks until Rt foot ulcer is healed.  PLANNED INTERVENTIONS: Patient/Family education, Self Care, and debridement and dressing change.  PLAN FOR NEXT SESSION: Continue to monitor wound debride and dress as appropriate.    Lurena Nida, PTA/CLT [facility] Ph: [phone]   Lurena Nida, PTA 03/15/2023, 8:54 AM

## 2023-03-21 ENCOUNTER — Ambulatory Visit (HOSPITAL_COMMUNITY): Payer: BC Managed Care – PPO | Admitting: Physical Therapy

## 2023-03-21 ENCOUNTER — Telehealth: Payer: Self-pay

## 2023-03-21 DIAGNOSIS — E11621 Type 2 diabetes mellitus with foot ulcer: Secondary | ICD-10-CM

## 2023-03-21 DIAGNOSIS — S91301D Unspecified open wound, right foot, subsequent encounter: Secondary | ICD-10-CM

## 2023-03-21 NOTE — Transitions of Care (Post Inpatient/ED Visit) (Signed)
   03/21/2023  Name: [name] MRN: [id number] DOB: [birth year]  Today's TOC FU Call Status: Today's TOC FU Call Status:: Unsuccessul Call (1st Attempt) Unsuccessful Call (1st Attempt) Date: 03/21/23  Attempted to reach the patient regarding the most recent Inpatient/ED visit.  Follow Up Plan: Additional outreach attempts will be made to reach the patient to complete the Transitions of Care (Post Inpatient/ED visit) call.   Signature Elisha Ponder LPN [facility] AWV Team Direct dial:  [phone]

## 2023-03-21 NOTE — Therapy (Signed)
OUTPATIENT PHYSICAL THERAPY Wound Treatment   Patient Name: [name] MRN: [id number] DOB:1966/05/26, 57 y.o., female Today's Date: 03/21/2023  PCP: [name], [name] REFERRING PROVIDER: [name], [name]  END OF SESSION:   PT End of Session - 03/21/23 1259     Visit Number 15    Number of Visits 23    Date for PT Re-Evaluation 03/30/23    Authorization Type BCBS    Progress Note Due on Visit 20    PT Start Time 1120    PT Stop Time 1200    PT Time Calculation (min) 40 min    Activity Tolerance Patient tolerated treatment well    Behavior During Therapy [facility] for tasks assessed/performed             Past Medical History:  Diagnosis Date   Chronic cough    Chronic pain    Diabetes mellitus without complication (HCC)    Dyspnea    GERD (gastroesophageal reflux disease)    Hypokalemia    Hypothyroidism    Left wrist pain 06/01/2021   Myonecrosis (HCC) 07/01/2021   Neurocardiogenic syncope    OSA (obstructive sleep apnea)    does not use CPAP   Osteomyelitis (HCC)    bilateral feet   Peripheral neuropathy    Presence of intrathecal pump    recieves Prialt/bupivicaine   Tear of gluteus medius tendon 07/01/2021   Wegener's granulomatosis 2009   Wegner's disease (congenital syphilitic osteochondritis)    Past Surgical History:  Procedure Laterality Date   AMPUTATION Right 10/21/2020   Procedure: Right hallux amputation;  Surgeon: Toni Arthurs, MD;  Location: [facility];  Service: Orthopedics;  Laterality: Right;   AMPUTATION TOE Bilateral 08/12/2020   Procedure: Right 3rd toe amputation; left hallux and 3rd toe amputation;  Surgeon: Toni Arthurs, MD;  Location: [facility];  Service: Orthopedics;  Laterality: Bilateral;    AMPUTATION TOE Right 03/24/2021   Procedure: AMPUTATION TOE;  Surgeon: Toni Arthurs, MD;  Location: [facility];  Service: Orthopedics;  Laterality: Right;   APPENDECTOMY     BACK SURGERY      BIOPSY  03/30/2022   Procedure: BIOPSY;  Surgeon: Tomma Lightning, MD;  Location: [facility];  Service: Pulmonary;;   BRONCHIAL WASHINGS  03/30/2022   Procedure: BRONCHIAL WASHINGS;  Surgeon: Tomma Lightning, MD;  Location: [facility];  Service: Pulmonary;;   CHOLECYSTECTOMY     COLONOSCOPY N/A 06/02/2013   Procedure: COLONOSCOPY;  Surgeon: Graylin Shiver, MD;  Location: [facility];  Service: Endoscopy;  Laterality: N/A;   HERNIA REPAIR  2000   INCISION AND DRAINAGE ABSCESS Left 07/07/2016   Procedure: DEBRIDMENT LEFT THIGH ABSCESS, EXISION ACUTE SKIN RASH LEFT THIGH(1CM LESION);  Surgeon: Claud Kelp, MD;  Location: [facility];  Service: General;  Laterality: Left;   IR FLUORO GUIDE CV LINE RIGHT  04/24/2022   IR IVC FILTER PLMT / S&I /IMG GUID/MOD SED  05/17/2022   IR US GUIDE VASC ACCESS RIGHT  04/24/2022   SEPTOPLASTY  02/2013   spleen anuyism     splenic aneurysm     TRANSMETATARSAL AMPUTATION Bilateral 10/21/2020   Procedure: Left transmetatarsal amputation;  Surgeon: Toni Arthurs, MD;  Location: [facility];  Service: Orthopedics;  Laterality: Bilateral;   TRANSMETATARSAL AMPUTATION Left 03/24/2021   Procedure: TRANSMETATARSAL AMPUTATION;  Surgeon: Toni Arthurs, MD;  Location: [facility];  Service: Orthopedics;  Laterality: Left;   VAGINAL HYSTERECTOMY     VIDEO  BRONCHOSCOPY Right 03/30/2022   Procedure: VIDEO BRONCHOSCOPY WITH FLUORO;  Surgeon: Tomma Lightning, MD;  Location: [facility];  Service: Pulmonary;  Laterality: Right;  atypical pneumonia   Patient Active Problem List   Diagnosis Date Noted   Cervical cancer screening 02/05/2023   Odynophagia 01/31/2023   Edema, peripheral 01/31/2023   Sleep disturbance 01/02/2023   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) 12/13/2022   Tinea of groin 09/30/2022   Need for immunization against influenza 09/30/2022   Aortic atherosclerosis (HCC) 06/29/2022   Type 2 diabetes mellitus (HCC) 06/29/2022   Acute deep vein  thrombosis (DVT) of left lower extremity (HCC) 05/17/2022   Rhabdomyolysis 04/18/2022   Transaminitis 04/17/2022   Recurrent pneumonia 04/12/2022   Pulmonary embolism (HCC) 03/28/2022   Severe sepsis (HCC) 03/26/2022   UTI (urinary tract infection) 03/26/2022   Opioid dependence (HCC) 03/26/2022   Acute metabolic encephalopathy 03/26/2022   Hip pain 02/20/2022   Arthritis of right hip 02/19/2022   Hypothyroidism    AKI (acute kidney injury) (HCC)    Class 2 obesity    Depression    Essential hypertension    Acute cystitis without hematuria 01/04/2022   Dysfunction of left rotator cuff 01/04/2022   Lobar pneumonia (HCC) 01/04/2022   Acquired absence of other toe(s), unspecified side (HCC) 08/01/2021   Myositis of right lower extremity 07/01/2021   Tear of gluteus medius tendon 07/01/2021   Left wrist pain 06/01/2021   Medication monitoring encounter 05/10/2021   Chronic pain 03/24/2021   COVID-19 virus infection 11/01/2020   Stage 3a chronic kidney disease (HCC) 09/08/2019   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   Steroid-induced hyperglycemia 05/11/2018   Morbid obesity (HCC) 01/25/2018   Neuropathy 01/25/2018   Presence of intrathecal pump 08/02/2017   Rectocele 03/09/2017   Abnormal finding on imaging 12/19/2016   Esophageal dysphagia 12/19/2016   Current chronic use of systemic steroids 10/26/2016   IFG (impaired fasting glucose) 10/26/2016   Arthralgia of multiple joints 07/15/2016   Headache 07/15/2016   Peripheral neuropathy 07/04/2016   Rash of body 07/04/2016   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   Neuropathic pain of both feet 01/11/2016   OSA (obstructive sleep apnea) 12/29/2015   Chronic insomnia 12/29/2015   Obesity, Class II, BMI 35-39.9, with comorbidity 12/29/2015   Polyneuropathy associated with underlying disease (HCC) 12/29/2015   Class 2 drug-induced obesity with serious comorbidity and body mass index (BMI) of 35.0 to  35.9 in adult 12/29/2015   Other mechanical complication of implanted electronic neurostimulator of spinal cord electrode (lead), initial encounter (HCC) 08/04/2015   Adjustment disorder with depressed mood 03/29/2015   Mononeuritis 07/20/2014   Obesity (BMI 30-39.9) 07/20/2014   Neurocardiogenic syncope 07/06/2014   Normocytic anemia 06/08/2014   POTS (postural orthostatic tachycardia syndrome) 06/08/2014   Encounter for long-term (current) use of high-risk medication 05/30/2013   Fall 05/30/2013   Other long term (current) drug therapy 05/30/2013   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/30/2013   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/2013   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  REFERRING DIAG: E11.621,L97.512 (ICD-10-CM) - Diabetic ulcer of toe of right foot associated with type 2 diabetes mellitus, with fat layer exposed (HCC)  THERAPY DIAG:  Difficulty in walking   Rationale for Evaluation and Treatment: Rehabilitation     Wound Therapy - 03/21/23 1350     Subjective pt states she had her husband change it on Sunday as it had drained through.  Comes today in a wheelchair, states she's having more pain in her Rt foot and has multiple other spots on both legs.    Patient and Family Stated Goals wounds to heal    Date of Onset 08/22/22    Prior Treatments self care    Pain Scale 0-10    Pain Score 5     Pain Type Chronic pain    Pain Location Foot    Pain Orientation Right    Pain Onset On-going     Patients Stated Pain Goal 0    Evaluation and Treatment Procedures Explained to Patient/Family Yes    Evaluation and Treatment Procedures agreed to    Wound Properties Date First Assessed: 01/04/23 Time First Assessed: 1355   Wound Image Images linked: 2    Dressing Type Gauze (Comment);Silver hydrofiber   vaseline perimeter, silverhydrofiber, 2x2, medipore tape, kerlix   Dressing Changed Changed    Dressing Status Old drainage    Dressing Change Frequency PRN    Site / Wound Assessment Granulation tissue;Yellow    % Wound base Red or Granulating 30%    % Wound base Yellow/Fibrinous Exudate 50%    % Wound base Other/Granulation Tissue (Comment) 20%    Peri-wound Assessment Maceration;Erythema (blanchable)   maceration/raw tissue expanding 3cm dorsal, 2.5cm plantar   Wound Length (cm) 1.4 cm    Wound Width (cm) 1.2 cm    Wound Depth (cm) 1.3 cm    Wound Volume (cm^3) 2.18 cm^3    Wound Surface Area (cm^2) 1.68 cm^2    Margins Unattached edges (unapproximated)    Drainage Amount Moderate    Drainage Description Serosanguineous;Odor - foul    Treatment Cleansed;Debridement (Selective)    Wound Properties Date First Assessed: 03/21/23 Time First Assessed: 1135 Wound Type: Other (Comment) Location: Foot Location Orientation: Right;Lateral Wound Description (Comments): lateral Rt foot Present on Admission: No   Wound Image Images linked: 1    Dressing Type None    Dressing Changed New    Dressing Status Clean;Intact    Dressing Change Frequency PRN    Site / Wound Assessment Pale;Yellow;Dry    % Wound base Red or Granulating 0%    % Wound base Yellow/Fibrinous Exudate 100%    Peri-wound Assessment Intact;Erythema (blanchable);Edema    Wound Length (cm) 1.3 cm    Wound Width (cm) 0.4 cm    Wound Depth (cm) 0 cm    Wound Volume (cm^3) 0 cm^3    Wound Surface Area (cm^2) 0.52 cm^2    Drainage Amount Minimal    Drainage Description Serous    Treatment Cleansed    Selective Debridement  (non-excisional) - Location perimeter of the wound and woundbed    Selective Debridement (non-excisional) - Tools Used Forceps;Scalpel    Selective Debridement (non-excisional) - Tissue Removed callous    Wound Therapy - Clinical Statement see below    Wound Therapy - Functional Problem List unable to wear shoes , difficult with walking    Factors Delaying/Impairing Wound Healing Altered sensation;Diabetes Mellitus;Multiple medical problems;Polypharmacy    Hydrotherapy Plan Debridement;Dressing change;Patient/family education  Wound Therapy - Frequency 2X / week    Wound Therapy - Current Recommendations PT    Wound Plan debride and dressing change    Dressing  GN:FAOZHYQM perimeter, silverhydrofiber, 2x2, medipore tape, kerlix    Dressing lateral new wound: xeroform                PATIENT EDUCATION: Education details: Dressing should not be painful take off if pt has increased pain.  Keep dressing dry take off if it gets wet Person educated: Patient Education method: Explanation Education comprehension: verbalized understanding   HOME EXERCISE PROGRAM: None issued   GOALS: Goals reviewed with patient? Yes  SHORT TERM GOALS: Target date: 01/24/2023  Other 7 wounds beside the main wound to be healed  Baseline: Goal status: MET  2.  Pt to be wearing Darco boot to avoid pressure on wound  Baseline:  Goal status: REVISED; Pt unable to use due to amputee     LONG TERM GOALS: Target date: 02/15/2023  Wound to have no depth and be no greater than .5x .5cm in size to allow pt to feel comfortable with self care. Baseline:  Goal status: IN PROGRESS  2.  PT pain to be no greater than a 2/10 Baseline:  Goal status: IN PROGRESS    ASSESSMENT:  CLINICAL IMPRESSION: Removed bandaging to reveal area is much worse as presented last week.  Noted odor with increase in size and depth of primary wound at end of Rt limb.  Area macerated with destruction of perimeter of  original wound now spanning 3cm upwards to dorsal aspect and 2.5cm down towards plantar side.  This tissue debrided to reveal raw tissue beneath.  Depth of wound at least 1.3cm but possibly more. Used silver hydrofiber to pack into wound.  Vaseline applied to perimeter.  Noted new area on lateral foot that appears to be result of rubbing/friction or possibly pressure.  Measured and recorded into record.  Applied xerform to this area.  Increased redness circling wound with skin marker used to outline this area.  Instructed pt to return to primary as fear she may have a deeper infection and will need to rule out osteomyelitis.  Pt verbalized understanding.    OBJECTIVE IMPAIRMENTS: difficulty walking, obesity, pain, and decreased skin integrity .   ACTIVITY LIMITATIONS: dressing, hygiene/grooming, and locomotion level  PARTICIPATION LIMITATIONS: shopping and community activity  PERSONAL FACTORS: Fitness, Time since onset of injury/illness/exacerbation, and 1-2 comorbidities: obesity, DM, Lt transmetatarsal amputation  are also affecting patient's functional outcome.   REHAB POTENTIAL: Good  CLINICAL DECISION MAKING: Stable/uncomplicated  EVALUATION COMPLEXITY: moderate  PLAN: PT FREQUENCY: 2x/week  PT DURATION: 6 weeks; continue for 6 more weeks until Rt foot ulcer is healed.  PLANNED INTERVENTIONS: Patient/Family education, Self Care, and debridement and dressing change.  PLAN FOR NEXT SESSION: Continue to monitor wound debride and dress as appropriate.  Follow up if returned to MD/check perimeter of erythema.    Lurena Nida, PTA/CLT [facility] Ph: [phone]   Lurena Nida, PTA 03/21/2023, 4:45 PM

## 2023-03-22 ENCOUNTER — Other Ambulatory Visit: Payer: Self-pay | Admitting: Gastroenterology

## 2023-03-22 ENCOUNTER — Ambulatory Visit: Payer: BC Managed Care – PPO | Admitting: Family Medicine

## 2023-03-22 ENCOUNTER — Telehealth: Payer: Self-pay | Admitting: Family Medicine

## 2023-03-22 NOTE — Telephone Encounter (Signed)
After reviewing the notes from wound care, I called the patient and advised her to report to the Emergency Department to rule out osteomyelitis. The report has been provided to the ED provider at [facility] Emergency Department, and the patient has verbalized her understanding. She reported having a fever since Sunday, and noted that the wound on her right leg is tender, warm, and erythematous.

## 2023-03-26 ENCOUNTER — Ambulatory Visit (HOSPITAL_COMMUNITY): Admission: RE | Admit: 2023-03-26 | Payer: BC Managed Care – PPO | Source: Ambulatory Visit

## 2023-03-26 ENCOUNTER — Other Ambulatory Visit (HOSPITAL_COMMUNITY): Payer: Self-pay | Admitting: Internal Medicine

## 2023-03-26 DIAGNOSIS — M810 Age-related osteoporosis without current pathological fracture: Secondary | ICD-10-CM

## 2023-03-30 ENCOUNTER — Telehealth: Payer: Self-pay | Admitting: Family Medicine

## 2023-03-30 NOTE — Telephone Encounter (Signed)
Patient called asking does she need to have wound care before her TOC on Tuesday, 06.11.2024

## 2023-04-02 NOTE — Telephone Encounter (Signed)
No

## 2023-04-02 NOTE — Telephone Encounter (Signed)
Pt states upon leaving hospital she was advised to check with PCP on referrals for wound care, also she states diabetes medication is not working well for her... since she has visit tomorrow advised this could be discussed at tomorrows visit, pt agreed, forwarding to you just as a heads up.

## 2023-04-03 ENCOUNTER — Encounter: Payer: Self-pay | Admitting: Internal Medicine

## 2023-04-03 ENCOUNTER — Ambulatory Visit: Payer: BC Managed Care – PPO | Admitting: Internal Medicine

## 2023-04-03 VITALS — BP 92/59 | HR 85 | Ht 64.0 in | Wt 227.0 lb

## 2023-04-03 DIAGNOSIS — L97513 Non-pressure chronic ulcer of other part of right foot with necrosis of muscle: Secondary | ICD-10-CM | POA: Diagnosis not present

## 2023-04-03 DIAGNOSIS — E1100 Type 2 diabetes mellitus with hyperosmolarity without nonketotic hyperglycemic-hyperosmolar coma (NKHHC): Secondary | ICD-10-CM

## 2023-04-03 DIAGNOSIS — E11621 Type 2 diabetes mellitus with foot ulcer: Secondary | ICD-10-CM

## 2023-04-03 LAB — POCT GLYCOSYLATED HEMOGLOBIN (HGB A1C): HbA1c, POC (controlled diabetic range): 7.5 % — AB (ref 0.0–7.0)

## 2023-04-03 LAB — LAB REPORT - SCANNED: EGFR: 72

## 2023-04-03 NOTE — Progress Notes (Unsigned)
   HPI:Ms.[name] is a 57 y.o. female who presents for hospital follow up. (Discharged from [facility] hospital 03/27/23. Patient had xray and sent to ED for concern of osteomyelitis. MRI showed cellulitis and mild myositis. She has draining fluid collection. She is scheduled for 2 weeks of IV Ceftriaxone to end 04/10/23. She believes her infection is healing well and less red than before.   Physical Exam: Vitals:   04/03/23 1607  BP: (!) 92/59  Pulse: 85  SpO2: 92%  Weight: 227 lb (103 kg)  Height: 5\' 4"  (1.626 m)     Physical Exam Constitutional:      Appearance: She is not ill-appearing or toxic-appearing.     Comments: Sitting in wheelchair, well kept  Cardiovascular:     Rate and Rhythm: Normal rate and regular rhythm.     Heart sounds: No murmur heard. Skin:    Comments: Clean bandages removed, some serosanguinous drainage on bandage next to wound. No purulent drainage, no sinus tract, less than 1 cm deep. Mild erythema around edges of wound. Picture taken below.      Assessment & Plan:   [name] was seen today for hospitalization follow-up.  Type 2 diabetes mellitus with hyperosmolarity without coma, without long-term current use of insulin (HCC) Assessment & Plan: Hgb A1c 7.5 today, 7.6 at last check Chronic problem, stable Continue Metformin   Orders: -     POCT glycosylated hemoglobin (Hb A1C)  Diabetic ulcer of right foot associated with type 2 diabetes mellitus, with necrosis of muscle, unspecified part of foot (HCC) Assessment & Plan: Patient healing well at the end of first week of IV Ceftriaxone PICC line dressing changed with patients supplies Continue Ceftriaxone  Referral placed to wound care for hyperbaric chamber therapy  Orders: -     AMB referral to wound care center      Milus Banister, MD

## 2023-04-03 NOTE — Patient Instructions (Signed)
Thank you, Ms.Takesha Mitch for allowing [place] to provide your care today.   I have ordered the following labs for you:   Lab Orders         POCT glycosylated hemoglobin (Hb A1C)      Referrals ordered today:    Referral Orders         AMB referral to wound care center       Reminders: I will follow up with results of hemoglobin A1c.     Thurmon Fair, M.D.

## 2023-04-04 ENCOUNTER — Encounter: Payer: Self-pay | Admitting: Internal Medicine

## 2023-04-04 DIAGNOSIS — E11621 Type 2 diabetes mellitus with foot ulcer: Secondary | ICD-10-CM | POA: Insufficient documentation

## 2023-04-04 NOTE — Assessment & Plan Note (Signed)
Hgb A1c 7.5 today, 7.6 at last check Chronic problem, stable Continue Metformin

## 2023-04-04 NOTE — Assessment & Plan Note (Signed)
Patient healing well at the end of first week of IV Ceftriaxone PICC line dressing changed with patients supplies Continue Ceftriaxone  Referral placed to wound care for hyperbaric chamber therapy

## 2023-04-09 ENCOUNTER — Encounter (HOSPITAL_BASED_OUTPATIENT_CLINIC_OR_DEPARTMENT_OTHER): Payer: BC Managed Care – PPO | Attending: Internal Medicine | Admitting: Internal Medicine

## 2023-04-09 DIAGNOSIS — Z89421 Acquired absence of other right toe(s): Secondary | ICD-10-CM | POA: Diagnosis not present

## 2023-04-09 DIAGNOSIS — E11621 Type 2 diabetes mellitus with foot ulcer: Secondary | ICD-10-CM | POA: Insufficient documentation

## 2023-04-09 DIAGNOSIS — L97513 Non-pressure chronic ulcer of other part of right foot with necrosis of muscle: Secondary | ICD-10-CM | POA: Insufficient documentation

## 2023-04-09 DIAGNOSIS — Z89422 Acquired absence of other left toe(s): Secondary | ICD-10-CM | POA: Insufficient documentation

## 2023-04-09 NOTE — Progress Notes (Signed)
[name], [name] ([id number]) [id number] Page 1 of 4 Visit Report for 04/09/2023 Abuse Risk Screen Details Patient Name: Date of Service: [name], [name] 04/09/2023 9:30 A M Medical Record Number: [id number] Patient Account Number: [id number] Date of Birth/Sex: Treating RN: [birth year] (57 y.o. [name], [name] Primary Care Merdith Boyd: [name] Other Clinician: Referring Omah Dewalt: Treating Ziggy Reveles/Extender: [name] in Treatment: 0 Abuse Risk Screen Items Answer ABUSE RISK SCREEN: Has anyone close to you tried to hurt or harm you recentlyo No Do you feel uncomfortable with anyone in your familyo No Has anyone forced you do things that you didnt want to doo No Electronic Signature(s) Signed: 04/09/2023 5:39:10 PM By: [name] RN, BSN Entered By: [name] on 04/09/2023 09:40:19 -------------------------------------------------------------------------------- Activities of Daily Living Details Patient Name: Date of Service: [name] 04/09/2023 9:30 A M Medical Record Number: [id number] Patient Account Number: [id number] Date of Birth/Sex: Treating RN: [birth year] (57 y.o. [name], [name] Primary Care Zaryah Seckel: [name] Other Clinician: Referring Ramirez Fullbright: Treating Yoselin Amerman/Extender: [name] in Treatment: 0 Activities of Daily Living Items Answer Activities of Daily Living (Please select one for each item) Drive Automobile Completely Able T Medications ake Completely Able Use T elephone Completely Able Care for Appearance Completely Able Use T oilet Completely Able Bath / Shower Completely Able Dress Self Completely Able Feed Self Completely Able Walk Need Assistance Get In / Out Bed Completely Able Housework Need Assistance Prepare Meals Need Assistance Handle Money Need Assistance Shop for Self Need Assistance Electronic Signature(s) Signed: 04/09/2023 5:39:10  PM By: [name] RN, BSN Entered By: [name] on 04/09/2023 09:40:39 Heckert, [name] ([id number]) [id number] Page 2 of 4 -------------------------------------------------------------------------------- Education Screening Details Patient Name: Date of Service: [name] 04/09/2023 9:30 A M Medical Record Number: [id number] Patient Account Number: [id number] Date of Birth/Sex: Treating RN: Jul 04, 1966 (57 y.o. [name], [name] Primary Care Demontez Novack: [name] Other Clinician: Referring Khali Albanese: Treating Stokes Rattigan/Extender: [name] in Treatment: 0 Primary Learner Assessed: Patient Learning Preferences/Education Level/Primary Language Learning Preference: Explanation, Demonstration, Printed Material Highest Education Level: College or Above Preferred Language: English Cognitive Barrier Language Barrier: No Translator Needed: No Memory Deficit: No Emotional Barrier: No Cultural/Religious Beliefs Affecting Medical Care: No Physical Barrier Impaired Vision: No Impaired Hearing: No Decreased Hand dexterity: No Knowledge/Comprehension Knowledge Level: High Comprehension Level: High Ability to understand written instructions: High Ability to understand verbal instructions: High Motivation Anxiety Level: Calm Cooperation: Cooperative Education Importance: Acknowledges Need Interest in Health Problems: Asks Questions Perception: Coherent Willingness to Engage in Self-Management High Activities: Readiness to Engage in Self-Management High Activities: Electronic Signature(s) Signed: 04/09/2023 5:39:10 PM By: [name] RN, BSN Entered By: [name] on 04/09/2023 09:41:03 -------------------------------------------------------------------------------- Fall Risk Assessment Details Patient Name: Date of Service: [name] 04/09/2023 9:30 A M Medical Record Number: [id number] Patient Account  Number: [id number] Date of Birth/Sex: Treating RN: 1966-07-05 (57 y.o. [name] Primary Care Ashunti Schofield: [name] Other Clinician: Referring Watson Robarge: Treating Sonnie Pawloski/Extender: [name] in Treatment: 0 Fall Risk Assessment Items Have you had 2 or more falls in the last 12 monthso 0 Yes Watchman, Collier ([id number]) [phone] [id number] Page 3 of 4 Have you had any fall that resulted in injury in the last 12 monthso 0 No FALLS RISK SCREEN History of falling - immediate or within 3 months 0 No Secondary diagnosis (Do you have 2 or more medical diagnoseso) 0 No Ambulatory aid None/bed rest/wheelchair/nurse 0  Yes Crutches/cane/walker 0 No Furniture 0 No Intravenous therapy Access/Saline/Heparin Lock 0 No Gait/Transferring Normal/ bed rest/ wheelchair 0 No Weak (short steps with or without shuffle, stooped but able to lift head while walking, may seek 10 Yes support from furniture) Impaired (short steps with shuffle, may have difficulty arising from chair, head down, impaired 0 No balance) Mental Status Oriented to own ability 0 Yes Electronic Signature(s) Signed: 04/09/2023 5:39:10 PM By: [name] RN, BSN Entered By: [name] on 04/09/2023 09:41:14 -------------------------------------------------------------------------------- Foot Assessment Details Patient Name: Date of Service: [name] 04/09/2023 9:30 A M Medical Record Number: [id number] Patient Account Number: [id number] Date of Birth/Sex: Treating RN: 04-Sep-1966 (57 y.o. [name] Primary Care Sofiah Lyne: [name] Other Clinician: Referring Milos Milligan: Treating Kila Godina/Extender: [name] [name]: 0 Foot Assessment Items Site Locations + = Sensation present, - = Sensation absent, C = Callus, U = Ulcer R = Redness, W = Warmth, M = Maceration, PU = Pre-ulcerative lesion F = Fissure, S = Swelling, D =  Dryness Assessment Right: Left: Other Deformity: No No Prior Foot Ulcer: No No Prior Amputation: No No Charcot Joint: Yes Yes Ambulatory Status: Ambulatory With Help Assistance Device: Kiwanda Arambula, [name] ([id number]) [id number] Page 4 of 4 Gait: Steady Notes uses wheelchair mostly to get around. Electronic Signature(s) Signed: 04/09/2023 5:39:10 PM By: [name] RN, BSN Entered By: [name] on 04/09/2023 09:52:55 -------------------------------------------------------------------------------- Nutrition Risk Screening Details Patient Name: Date of Service: ATLEIGH, MARRESE 04/09/2023 9:30 A M Medical Record Number: [id number] Patient Account Number: [id number] Date of Birth/Sex: Treating RN: 30-May-1966 (57 y.o. [name], [name] Primary Care Samaya Boardley: [name] Other Clinician: Referring Jaileigh Weimer: Treating Osher Oettinger/Extender: [name] [name]: 0 Height (in): 66 Weight (lbs): 195 Body Mass Index (BMI): 31.5 Nutrition Risk Screening Items Score Screening NUTRITION RISK SCREEN: I have an illness or condition that made me change the kind and/or amount of food I eat 2 Yes I eat fewer than two meals per day 0 No I eat few fruits and vegetables, or milk products 0 No I have three or more drinks of beer, liquor or wine almost every day 0 No I have tooth or mouth problems that make it hard for me to eat 0 No I don't always have enough money to buy the food I need 0 No I eat alone most of the time 0 No I take three or more different prescribed or over-the-counter drugs a day 1 Yes Without wanting to, I have lost or gained 10 pounds in the last six months 0 No I am not always physically able to shop, cook and/or feed myself 0 No Nutrition Protocols Good Risk Protocol Provide education on elevated blood Moderate Risk Protocol 0 sugars and impact on wound healing, as applicable High Risk Proctocol Risk  Level: Moderate Risk Score: 3 Electronic Signature(s) Signed: 04/09/2023 5:39:10 PM By: [name] RN, BSN Entered By: [name] on 04/09/2023 09:41:24

## 2023-04-09 NOTE — Progress Notes (Addendum)
[name], [name] ([id number]) [id number] Page 1 of 10 Visit Report for 04/09/2023 Allergy List Details Patient Name: Date of Service: [name], [name] 04/09/2023 9:30 A M Medical Record Number: [id number] Patient Account Number: [id number] Date of Birth/Sex: Treating RN: 04-09-1966 (57 y.o. [name], [name] Primary Care [name] Branan: [name] Other Clinician: Referring Karilynn Carranza: Treating [name]/Extender: [name] [name]: 0 Allergies Active Allergies ibuprofen Severity: Moderate penicillin Reaction: rash Severity: Moderate Allergy Notes Electronic Signature(s) Signed: 04/09/2023 5:39:10 PM By: Stacey Stall RN, BSN Entered By: [name] on 04/06/2023 15:45:57 -------------------------------------------------------------------------------- Arrival Information Details Patient Name: Date of Service: [name], [name] 04/09/2023 9:30 A M Medical Record Number: [id number] Patient Account Number: [id number] Date of Birth/Sex: Treating RN: [birth year] (57 y.o. [name], [name] Primary Care [name]: [name] Other Clinician: Referring [name]: Treating [name]/Extender: [name] in Treatment: 0 Visit Information Patient Arrived: Wheel Chair Arrival Time: 09:38 Accompanied By: [name] Transfer Assistance: Manual Patient Identification Verified: Yes Secondary Verification Process Completed: Yes Patient Requires Transmission-Based Precautions: No Patient Has Alerts: Yes Patient Alerts: Patient on Blood Thinner Heparin History Since Last Visit Added or deleted any medications: No Any new allergies or adverse reactions: No Had a fall or experienced change in activities of daily living that may affect risk of falls: No Signs or symptoms of abuse/neglect since last visito No Hospitalized since last visit: No Implantable device outside of the clinic excluding cellular tissue based products  placed in the center since last visit: No Has Dressing in Place as Prescribed: Yes Electronic Signature(s) Signed: 04/09/2023 5:39:10 PM By: Stacey Stall RN, BSN Entered By: [name] on 04/09/2023 11:05:51 [name], [name] ([id number]) [id number] Page 2 of 10 -------------------------------------------------------------------------------- Clinic Level of Care Assessment Details Patient Name: Date of Service: [name], [name] 04/09/2023 9:30 A M Medical Record Number: [id number] Patient Account Number: [id number] Date of Birth/Sex: Treating RN: September 01, 1966 (57 y.o. F) Primary Care [name]: [name] Other Clinician: Referring Bosten Newstrom: Treating Karisma [name]/Extender: [name] in Treatment: 0 Clinic Level of Care Assessment Items TOOL 1 Quantity Score []  - 0 Use when EandM and Procedure is performed on INITIAL visit ASSESSMENTS - Nursing Assessment / Reassessment X- 1 20 General Physical Exam (combine w/ comprehensive assessment (listed just below) when performed on new pt. evals) X- 1 25 Comprehensive Assessment (HX, ROS, Risk Assessments, Wounds Hx, etc.) ASSESSMENTS - Wound and Skin Assessment / Reassessment []  - 0 Dermatologic / Skin Assessment (not related to wound area) ASSESSMENTS - Ostomy and/or Continence Assessment and Care []  - 0 Incontinence Assessment and Management []  - 0 Ostomy Care Assessment and Management (repouching, etc.) PROCESS - Coordination of Care X - Simple Patient / Family Education for ongoing care 1 15 []  - 0 Complex (extensive) Patient / Family Education for ongoing care X- 1 10 Staff obtains Chiropractor, Records, T Results / Process Orders est []  - 0 Staff telephones HHA, Nursing Homes / Clarify orders / etc []  - 0 Routine Transfer to another Facility (non-emergent condition) []  - 0 Routine Hospital Admission (non-emergent condition) X- 1 15 New Admissions / Manufacturing engineer /  Ordering NPWT Apligraf, etc. , []  - 0 Emergency Hospital Admission (emergent condition) PROCESS - Special Needs []  - 0 Pediatric / Minor Patient Management []  - 0 Isolation Patient Management []  - 0 Hearing / Language / Visual special needs []  - 0 Assessment of Community assistance (transportation, D/C planning, etc.) []  - 0 Additional assistance / Altered mentation []  - 0 Support Surface(s)  Assessment (bed, cushion, seat, etc.) INTERVENTIONS - Miscellaneous []  - 0 External ear exam []  - 0 Patient Transfer (multiple staff / Nurse, adult / Similar devices) []  - 0 Simple Staple / Suture removal (25 or less) []  - 0 Complex Staple / Suture removal (26 or more) []  - 0 Hypo/Hyperglycemic Management (do not check if billed separately) X- 1 15 Ankle / Brachial Index (ABI) - do not check if billed separately Has the patient been seen at the hospital within the last three years: Yes Total Score: 100 Level Of Care: New/Established - Level 3 Electronic Signature(s) Signed: 04/23/2023 3:00:01 PM By: [name], [name] ([id number]) [id number] Page 3 of 10 Signed: 04/23/2023 3:00:01 PM By: [name] Entered By: [name] on 04/23/2023 11:19:56 -------------------------------------------------------------------------------- Encounter Discharge Information Details Patient Name: Date of Service: [name], [name] 04/09/2023 9:30 A M Medical Record Number: [id number] Patient Account Number: [id number] Date of Birth/Sex: Treating RN: 03-10-1966 (57 y.o. [name], [name] Primary Care [name]: [name] Other Clinician: Referring [name]: Treating [name]/Extender: [name] in Treatment: 0 Encounter Discharge Information Items Post Procedure Vitals Discharge Condition: Stable Temperature (F): 99 Ambulatory Status: Wheelchair Pulse (bpm): 84 Discharge Destination: Home Respiratory Rate (breaths/min): 20 Transportation:  Private Auto Blood Pressure (mmHg): 123/84 Accompanied By: dad Schedule Follow-up Appointment: Yes Clinical Summary of Care: Electronic Signature(s) Signed: 04/09/2023 5:39:10 PM By: Stacey Stall RN, BSN Entered By: [name] on 04/09/2023 10:24:30 -------------------------------------------------------------------------------- Lower Extremity Assessment Details Patient Name: Date of Service: [name], [name] 04/09/2023 9:30 A M Medical Record Number: [id number] Patient Account Number: [id number] Date of Birth/Sex: Treating RN: 10-Oct-1966 (57 y.o. [name] Primary Care Celica Kotowski: [name] Other Clinician: Referring Tavi Hoogendoorn: Treating Paysley Poplar/Extender: [name] [name]: 0 Edema Assessment Assessed: [Left: No] [Right: Yes] Edema: [Left: Ye] [Right: s] Calf Left: Right: Point of Measurement: 37 cm From Medial Instep 40 cm Ankle Left: Right: Point of Measurement: 11 cm From Medial Instep 25 cm Knee To Floor Left: Right: From Medial Instep 47 cm Vascular Assessment Pulses: Dorsalis Pedis Palpable: [Right:Yes] Doppler Audible: [Right:Yes] Kosta, Alexis ([id number]) [Right:[id number] Page 4 of 10] Posterior Tibial Palpable: [Right:Yes] Doppler Audible: [Right:Yes] Blood Pressure: Brachial: [Right:123] Ankle: [Right:Dorsalis Pedis: 130 1.06] Electronic Signature(s) Signed: 04/09/2023 5:39:10 PM By: Stacey Stall RN, BSN Entered By: [name] on 04/09/2023 09:51:45 -------------------------------------------------------------------------------- Multi Wound Chart Details Patient Name: Date of Service: [name], [name] 04/09/2023 9:30 A M Medical Record Number: [id number] Patient Account Number: [id number] Date of Birth/Sex: Treating RN: [birth year] (57 y.o. F) Primary Care Loye Reininger: [name] Other Clinician: Referring Hareem Surowiec: Treating Duchess Armendarez/Extender: [name]  in Treatment: 0 Vital Signs Height(in): 64 Pulse(bpm): 84 Weight(lbs): 225 Blood Pressure(mmHg): 123/84 Body Mass Index(BMI): 38.6 Temperature(F): 99 Respiratory Rate(breaths/min): 20 [17:Photos:] [N/A:N/A] Right, Plantar Foot N/A N/A Wound Location: Blister N/A N/A Wounding Event: Diabetic Wound/Ulcer of the Lower N/A N/A Primary Etiology: Extremity Anemia, Sleep Apnea, Hypotension, N/A N/A Comorbid History: Type II Diabetes, Osteoarthritis, Neuropathy 11/23/2022 N/A N/A Date Acquired: 0 N/A N/A Weeks of Treatment: Open N/A N/A Wound Status: No N/A N/A Wound Recurrence: 0.8x0.5x1 N/A N/A Measurements L x W x D (cm) 0.314 N/A N/A A (cm) : rea 0.314 N/A N/A Volume (cm) : 1 Position 1 (o'clock): 1.5 Maximum Distance 1 (cm): Yes N/A N/A Tunneling: Grade 3 N/A N/A Classification: Medium N/A N/A Exudate A mount: Serosanguineous N/A N/A Exudate Type: red, brown N/A N/A Exudate Color: Distinct, outline attached N/A N/A Wound Margin:  Small (1-33%) N/A N/A Granulation A mount: Pink, Pale N/A N/A Granulation Quality: Large (67-100%) N/A N/A Necrotic A mount: Fat Layer (Subcutaneous Tissue): Yes N/A N/A Exposed Structures: Fascia: No Tendon: No Muscle: No Joint: No Bone: No Bence, Cipriana ([id number]) [id number] Page 5 of 10 None N/A N/A Epithelialization: Debridement - Excisional N/A N/A Debridement: 10:10 N/A N/A Pre-procedure Verification/Time Out Taken: Lidocaine 4% Topical Solution N/A N/A Pain Control: Callus, Subcutaneous, Slough N/A N/A Tissue Debrided: Skin/Subcutaneous Tissue N/A N/A Level: 0.31 N/A N/A Debridement A (sq cm): rea Curette N/A N/A Instrument: Minimum N/A N/A Bleeding: Pressure N/A N/A Hemostasis A chieved: 0 N/A N/A Procedural Pain: 0 N/A N/A Post Procedural Pain: Procedure was tolerated well N/A N/A Debridement Treatment Response: 0.8x0.5x1 N/A N/A Post Debridement Measurements L x W x  D (cm) 0.314 N/A N/A Post Debridement Volume: (cm) Excoriation: No N/A N/A Periwound Skin Texture: Induration: No Callus: No Crepitus: No Rash: No Scarring: No Maceration: No N/A N/A Periwound Skin Moisture: Dry/Scaly: No Atrophie Blanche: No N/A N/A Periwound Skin Color: Cyanosis: No Ecchymosis: No Erythema: No Hemosiderin Staining: No Mottled: No Pallor: No Rubor: No Debridement N/A N/A Procedures Performed: Treatment Notes Wound #17 (Foot) Wound Laterality: Plantar, Right Cleanser Wound Cleanser Discharge Instruction: Cleanse the wound with wound cleanser prior to applying a clean dressing using gauze sponges, not tissue or cotton balls. Peri-Wound Care Skin Prep Discharge Instruction: Use skin prep as directed Topical Primary Dressing Hydrofera Blue Ready Transfer Foam, 2.5x2.5 (in/in) Discharge Instruction: Apply directly to wound bed as directed Secondary Dressing Optifoam Non-Adhesive Dressing, 4x4 in Discharge Instruction: Apply over primary dressing as a foam donut. Woven Gauze Sponges 2x2 in Discharge Instruction: Apply over primary dressing as directed. Secured With [employer], 4.5x3.1 (in/yd) Discharge Instruction: Secure with Kerlix as directed. 66M Medipore H Soft Cloth Surgical T ape, 4 x 10 (in/yd) Discharge Instruction: Secure with tape as directed. Fish net Discharge Instruction: apply over the kerlix. Compression Wrap Compression Stockings Add-Ons Electronic Signature(s) Signed: 04/09/2023 12:26:35 PM By: Geralyn Corwin DO Entered By: Geralyn Corwin on 04/09/2023 10:26:37 [name] ([id number]) [id number] Page 6 of 10 -------------------------------------------------------------------------------- Multi-Disciplinary Care Plan Details Patient Name: Date of Service: ANDILYNN, GUARDIOLA 04/09/2023 9:30 A M Medical Record Number: [id number] Patient Account Number: [id number] Date of Birth/Sex: Treating  RN: 1966/02/28 (57 y.o. [name], [name] Primary Care Caroly Purewal: [name] Other Clinician: Referring Anagha Loseke: Treating Duvan Mousel/Extender: [name] in Treatment: 0 Active Inactive HBO Nursing Diagnoses: Anxiety related to feelings of confinement associated with the hyperbaric oxygen chamber Goals: Patient and/or family will be able to state/discuss factors appropriate to the management of their disease process during treatment Date Initiated: 04/09/2023 Target Resolution Date: 04/27/2023 Goal Status: Active Interventions: Assess and provide for patients comfort related to the hyperbaric environment and equalization of middle ear Notes: Necrotic Tissue Nursing Diagnoses: Knowledge deficit related to management of necrotic/devitalized tissue Goals: Necrotic/devitalized tissue will be minimized in the wound bed Date Initiated: 04/09/2023 Target Resolution Date: 04/27/2023 Goal Status: Active Patient/caregiver will verbalize understanding of reason and process for debridement of necrotic tissue Date Initiated: 04/09/2023 Target Resolution Date: 04/20/2023 Goal Status: Active Interventions: Assess patient pain level pre-, during and post procedure and prior to discharge Provide education on necrotic tissue and debridement process Treatment Activities: Apply topical anesthetic as ordered : 04/09/2023 Notes: Orientation to the Wound Care Program Nursing Diagnoses: Knowledge deficit related to the wound healing center program Goals: Patient/caregiver will verbalize understanding of the Wound Healing Center  Program Date Initiated: 04/09/2023 Target Resolution Date: 04/27/2023 Goal Status: Active Interventions: Provide education on orientation to the wound center Notes: Wound/Skin Impairment Nursing Diagnoses: Knowledge deficit related to ulceration/compromised skin integrity Goals: [name] ([id number]) [id number] Page 7  of 10 Patient/caregiver will verbalize understanding of skin care regimen Date Initiated: 04/09/2023 Target Resolution Date: 04/27/2023 Goal Status: Active Interventions: Assess patient/caregiver ability to perform ulcer/skin care regimen upon admission and as needed Assess ulceration(s) every visit Provide education on ulcer and skin care Screen for HBO Treatment Activities: Skin care regimen initiated : 04/09/2023 Topical wound management initiated : 04/09/2023 Notes: Electronic Signature(s) Signed: 04/09/2023 5:39:10 PM By: Stacey Stall RN, BSN Entered By: [name] on 04/09/2023 10:18:21 -------------------------------------------------------------------------------- Pain Assessment Details Patient Name: Date of Service: [name], [name] 04/09/2023 9:30 A M Medical Record Number: [id number] Patient Account Number: [id number] Date of Birth/Sex: Treating RN: April 21, 1966 (57 y.o. [name] Primary Care Christy Friede: [name] Other Clinician: Referring Niklaus Mamaril: Treating Keymani Glynn/Extender: [name] [name]: 0 Active Problems Location of Pain Severity and Description of Pain Patient Has Paino Yes Site Locations Pain Location: Generalized Pain, Pain in Ulcers Rate the pain. Current Pain Level: 7 Pain Management and Medication Current Pain Management: Medication: No Cold Application: No Rest: No Massage: No Activity: No T.E.N.S.: No Heat Application: No Leg drop or elevation: No Is the Current Pain Management Adequate: Adequate How does your wound impact your activities of daily livingo Sleep: No Bathing: No Appetite: No Relationship With Others: No Bladder Continence: No Emotions: No Bowel Continence: No Work: No Toileting: No Drive: No Dressing: No Hobbies: No [name], [name] ([id number]) [id number] Page 8 of 10 Electronic Signature(s) Signed: 04/09/2023 5:39:10 PM By: Stacey Stall RN,  BSN Entered By: [name] on 04/09/2023 09:39:55 -------------------------------------------------------------------------------- Patient/Caregiver Education Details Patient Name: Date of Service: [name], [name] 6/17/2024andnbsp9:30 A M Medical Record Number: [id number] Patient Account Number: [id number] Date of Birth/Gender: Treating RN: 04-01-1966 (57 y.o. [name] Primary Care Physician: [name] Other Clinician: Referring Physician: Treating Physician/Extender: [name] in Treatment: 0 Education Assessment Education Provided To: Patient Education Topics Provided Hyperbaric Oxygenation: Handouts: Hyperbaric Oxygen Methods: Explain/Verbal, Printed Responses: Reinforcements needed Welcome T [facility] Packet: o Handouts: [facility] Pledge form, Welcome T [facility] o Methods: Explain/Verbal, Printed Responses: Reinforcements needed Electronic Signature(s) Signed: 04/09/2023 5:39:10 PM By: Stacey Stall RN, BSN Entered By: [name] on 04/09/2023 10:18:48 -------------------------------------------------------------------------------- Wound Assessment Details Patient Name: Date of Service: [name], [name] 04/09/2023 9:30 A M Medical Record Number: [id number] Patient Account Number: [id number] Date of Birth/Sex: Treating RN: May 11, 1966 (57 y.o. [name], [name] Primary Care Elisavet Buehrer: [name] Other Clinician: Referring Blima Jaimes: Treating Trev Boley/Extender: [name] [name]: 0 Wound Status Wound Number: 17 Primary Diabetic Wound/Ulcer of the Lower Extremity Etiology: Wound Location: Right, Plantar Foot Wound Open Wounding Event: Blister Status: Date Acquired: 11/23/2022 Comorbid Anemia, Sleep Apnea, Hypotension, Type II Diabetes, Weeks Of Treatment: 0 History: Osteoarthritis, Neuropathy Clustered Wound: No Photos [name], [name] ([id number])  [id number] Page 9 of 10 Wound Measurements Length: (cm) 0.8 Width: (cm) 0.5 Depth: (cm) 1 Area: (cm) 0.314 Volume: (cm) 0.314 % Reduction in Area: % Reduction in Volume: Epithelialization: None Tunneling: Yes Position (o'clock): 1 Maximum Distance: (cm) 1.5 Undermining: No Wound Description Classification: Grade 3 Wound Margin: Distinct, outline attached Exudate Amount: Medium Exudate Type: Serosanguineous Exudate Color: red, brown Foul Odor After Cleansing: No Slough/Fibrino Yes Wound Bed  Granulation Amount: Small (1-33%) Exposed Structure Granulation Quality: Pink, Pale Fascia Exposed: No Necrotic Amount: Large (67-100%) Fat Layer (Subcutaneous Tissue) Exposed: Yes Necrotic Quality: Adherent Slough Tendon Exposed: No Muscle Exposed: No Joint Exposed: No Bone Exposed: No Periwound Skin Texture Texture Color No Abnormalities Noted: No No Abnormalities Noted: No Callus: No Atrophie Blanche: No Crepitus: No Cyanosis: No Excoriation: No Ecchymosis: No Induration: No Erythema: No Rash: No Hemosiderin Staining: No Scarring: No Mottled: No Pallor: No Moisture Rubor: No No Abnormalities Noted: No Dry / Scaly: No Maceration: No Electronic Signature(s) Signed: 04/09/2023 5:39:10 PM By: Stacey Stall RN, BSN Entered By: [name] on 04/09/2023 10:07:16 -------------------------------------------------------------------------------- Vitals Details Patient Name: Date of Service: [name], [name] 04/09/2023 9:30 A M Medical Record Number: [id number] Patient Account Number: [id number] Date of Birth/Sex: Treating RN: 04/18/1966 (57 y.o. [name], [name] Primary Care Lido Maske: [name] Other Clinician: Referring Glenice Ciccone: Treating Tyric Rodeheaver/Extender: [name] in Treatment: 0 [place], [place] ([id number]) [id number] Page 10 of 10 Vital Signs Time Taken: 09:40 Temperature (F):  99 Height (in): 64 Pulse (bpm): 84 Source: Stated Respiratory Rate (breaths/min): 20 Weight (lbs): 225 Blood Pressure (mmHg): 123/84 Source: Stated Reference Range: 80 - 120 mg / dl Body Mass Index (BMI): 38.6 Electronic Signature(s) Signed: 04/09/2023 5:39:10 PM By: Stacey Stall RN, BSN Entered By: [name] on 04/09/2023 09:44:39

## 2023-04-10 LAB — LAB REPORT - SCANNED: EGFR: 75

## 2023-04-11 ENCOUNTER — Other Ambulatory Visit: Payer: Self-pay | Admitting: Family Medicine

## 2023-04-11 DIAGNOSIS — E7849 Other hyperlipidemia: Secondary | ICD-10-CM

## 2023-04-11 DIAGNOSIS — G479 Sleep disorder, unspecified: Secondary | ICD-10-CM

## 2023-04-12 ENCOUNTER — Other Ambulatory Visit: Payer: Self-pay | Admitting: Gastroenterology

## 2023-04-12 ENCOUNTER — Other Ambulatory Visit: Payer: Self-pay | Admitting: Family Medicine

## 2023-04-12 DIAGNOSIS — G479 Sleep disorder, unspecified: Secondary | ICD-10-CM

## 2023-04-16 ENCOUNTER — Encounter (HOSPITAL_BASED_OUTPATIENT_CLINIC_OR_DEPARTMENT_OTHER): Payer: BC Managed Care – PPO | Admitting: Internal Medicine

## 2023-04-16 DIAGNOSIS — E11621 Type 2 diabetes mellitus with foot ulcer: Secondary | ICD-10-CM | POA: Diagnosis not present

## 2023-04-16 DIAGNOSIS — Z89421 Acquired absence of other right toe(s): Secondary | ICD-10-CM

## 2023-04-16 DIAGNOSIS — L97513 Non-pressure chronic ulcer of other part of right foot with necrosis of muscle: Secondary | ICD-10-CM | POA: Diagnosis not present

## 2023-04-16 DIAGNOSIS — Z89422 Acquired absence of other left toe(s): Secondary | ICD-10-CM

## 2023-04-16 NOTE — Progress Notes (Signed)
MAGNOLIA, MATTILA ([id number]) [id number] Page 1 of 10 Visit Report for 04/16/2023 Arrival Information Details Patient Name: Date of Service: [name], [name] 04/16/2023 3:00 PM Medical Record Number: [id number] Patient Account Number: [id number] Date of Birth/Sex: Treating RN: Sep 20, 1966 (57 y.o. [name], [name] Primary Care Militza Devery: [name] Other Clinician: Referring Kendrick Remigio: Treating Jahniyah Revere/Extender: [name] in Treatment: 1 Visit Information History Since Last Visit Added or deleted any medications: No Patient Arrived: Wheel Chair Any new allergies or adverse reactions: No Arrival Time: 15:06 Had a fall or experienced change in No Accompanied By: father activities of daily living that may affect Transfer Assistance: None risk of falls: Patient Identification Verified: Yes Signs or symptoms of abuse/neglect since No Secondary Verification Process Completed: Yes last visito Patient Requires Transmission-Based Precautions: No Hospitalized since last visit: No Patient Has Alerts: Yes Implantable device outside of the clinic No Patient Alerts: Patient on Blood Thinner excluding Heparin cellular tissue based products placed in the center since last visit: Has Dressing in Place as Prescribed: Yes Has Footwear/Offloading in Place as Yes Prescribed: Right: Surgical Shoe with Pressure Relief Insole Pain Present Now: No Electronic Signature(s) Signed: 04/16/2023 5:35:38 PM By: [name] RN, BSN Entered By: [name] on 04/16/2023 15:07:20 -------------------------------------------------------------------------------- Clinic Level of Care Assessment Details Patient Name: Date of Service: [name], [name] 04/16/2023 3:00 PM Medical Record Number: [id number] Patient Account Number: [id number] Date of Birth/Sex: Treating RN: 14-Sep-1966 (57 y.o. [name], [name] Primary Care Quintavia Rogstad: [name] Other  Clinician: Referring Aradhya Shellenbarger: Treating Judi Jaffe/Extender: [name] in Treatment: 1 Clinic Level of Care Assessment Items TOOL 4 Quantity Score X- 1 0 Use when only an EandM is performed on FOLLOW-UP visit ASSESSMENTS - Nursing Assessment / Reassessment X- 1 10 Reassessment of Co-morbidities (includes updates in patient status) X- 1 5 Reassessment of Adherence to Treatment Plan ASSESSMENTS - Wound and Skin A ssessment / Reassessment X - Simple Wound Assessment / Reassessment - one wound 1 5 []  - 0 Complex Wound Assessment / Reassessment - multiple wounds X- 1 10 Dermatologic / Skin Assessment (not related to wound area) Loyd, Treana ([id number]) [id number] Page 2 of 10 ASSESSMENTS - Focused Assessment X- 1 5 Circumferential Edema Measurements - multi extremities X- 1 10 Nutritional Assessment / Counseling / Intervention []  - 0 Lower Extremity Assessment (monofilament, tuning fork, pulses) []  - 0 Peripheral Arterial Disease Assessment (using hand held doppler) ASSESSMENTS - Ostomy and/or Continence Assessment and Care []  - 0 Incontinence Assessment and Management []  - 0 Ostomy Care Assessment and Management (repouching, etc.) PROCESS - Coordination of Care X - Simple Patient / Family Education for ongoing care 1 15 []  - 0 Complex (extensive) Patient / Family Education for ongoing care X- 1 10 Staff obtains Chiropractor, Records, T Results / Process Orders est X- 1 10 Staff telephones HHA, Nursing Homes / Clarify orders / etc []  - 0 Routine Transfer to another Facility (non-emergent condition) []  - 0 Routine Hospital Admission (non-emergent condition) []  - 0 New Admissions / Manufacturing engineer / Ordering NPWT Apligraf, etc. , []  - 0 Emergency Hospital Admission (emergent condition) X- 1 10 Simple Discharge Coordination []  - 0 Complex (extensive) Discharge Coordination PROCESS - Special Needs []  -  0 Pediatric / Minor Patient Management []  - 0 Isolation Patient Management []  - 0 Hearing / Language / Visual special needs []  - 0 Assessment of Community assistance (transportation, D/C planning, etc.) []  - 0 Additional assistance / Altered mentation []  - 0  Support Surface(s) Assessment (bed, cushion, seat, etc.) INTERVENTIONS - Wound Cleansing / Measurement X - Simple Wound Cleansing - one wound 1 5 []  - 0 Complex Wound Cleansing - multiple wounds X- 1 5 Wound Imaging (photographs - any number of wounds) []  - 0 Wound Tracing (instead of photographs) X- 1 5 Simple Wound Measurement - one wound []  - 0 Complex Wound Measurement - multiple wounds INTERVENTIONS - Wound Dressings X - Small Wound Dressing one or multiple wounds 1 10 []  - 0 Medium Wound Dressing one or multiple wounds []  - 0 Large Wound Dressing one or multiple wounds []  - 0 Application of Medications - topical []  - 0 Application of Medications - injection INTERVENTIONS - Miscellaneous []  - 0 External ear exam []  - 0 Specimen Collection (cultures, biopsies, blood, body fluids, etc.) []  - 0 Specimen(s) / Culture(s) sent or taken to Lab for analysis []  - 0 Patient Transfer (multiple staff / Michiel Sites Lift / Similar devices) []  - 0 Simple Staple / Suture removal (25 or less) Deyo, Casee ([id number]) [id number] Page 3 of 10 []  - 0 Complex Staple / Suture removal (26 or more) []  - 0 Hypo / Hyperglycemic Management (close monitor of Blood Glucose) []  - 0 Ankle / Brachial Index (ABI) - do not check if billed separately X- 1 5 Vital Signs Has the patient been seen at the hospital within the last three years: Yes Total Score: 120 Level Of Care: New/Established - Level 4 Electronic Signature(s) Signed: 04/16/2023 5:35:38 PM By: [name] RN, BSN Entered By: [name] on 04/16/2023  15:39:39 -------------------------------------------------------------------------------- Encounter Discharge Information Details Patient Name: Date of Service: [name], [name] 04/16/2023 3:00 PM Medical Record Number: [id number] Patient Account Number: [id number] Date of Birth/Sex: Treating RN: 13-May-1966 (57 y.o. [name], [name] Primary Care Jahne Krukowski: [name] Other Clinician: Referring Aquanetta Schwarz: Treating Khamari Sheehan/Extender: [name] in Treatment: 1 Encounter Discharge Information Items Discharge Condition: Stable Ambulatory Status: Wheelchair Discharge Destination: Home Transportation: Private Auto Accompanied By: dad Schedule Follow-up Appointment: Yes Clinical Summary of Care: Electronic Signature(s) Signed: 04/16/2023 5:35:38 PM By: [name] RN, BSN Entered By: [name] on 04/16/2023 15:40:19 -------------------------------------------------------------------------------- Lower Extremity Assessment Details Patient Name: Date of Service: [name], [name] 04/16/2023 3:00 PM Medical Record Number: [id number] Patient Account Number: [id number] Date of Birth/Sex: Treating RN: 1966/07/04 (57 y.o. [name] Primary Care Graycee Greeson: [name] Other Clinician: Referring Thinh Cuccaro: Treating Aragon Scarantino/Extender: [name] [name]: 1 Edema Assessment Assessed: [Left: No] [Right: Yes] Edema: [Left: Ye] [Right: s] Calf Left: Right: Point of Measurement: 37 cm From Medial Instep 39.5 cm Ankle Left: Right: Grimm, [name] ([id number]) [id number] Page 4 of 10 Point of Measurement: 11 cm From Medial Instep 24 cm Vascular Assessment Pulses: Dorsalis Pedis Palpable: [Right:Yes] Electronic Signature(s) Signed: 04/16/2023 5:35:38 PM By: [name] RN, BSN Entered By: [name] on 04/16/2023  15:12:01 -------------------------------------------------------------------------------- Multi Wound Chart Details Patient Name: Date of Service: [name], Fany 04/16/2023 3:00 PM Medical Record Number: [id number] Patient Account Number: [id number] Date of Birth/Sex: Treating RN: December 11, 1965 (57 y.o. F) Primary Care Deserai Cansler: [name] Other Clinician: Referring [place]ily Zaragoza: Treating Nahzir Pohle/Extender: [name] in Treatment: 1 Vital Signs Height(in): 64 Pulse(bpm): 87 Weight(lbs): 225 Blood Pressure(mmHg): 117/74 Body Mass Index(BMI): 38.6 Temperature(F): 98.5 Respiratory Rate(breaths/min): 16 [17:Photos:] [N/A:N/A] Right, Plantar Foot N/A N/A Wound Location: Blister N/A N/A Wounding Event: Diabetic Wound/Ulcer of the Lower N/A N/A Primary Etiology: Extremity Anemia, Sleep Apnea, Hypotension, N/A N/A Comorbid History:  Type II Diabetes, Osteoarthritis, Neuropathy 11/23/2022 N/A N/A Date Acquired: 1 N/A N/A Weeks of Treatment: Open N/A N/A Wound Status: No N/A N/A Wound Recurrence: 0.9x0.6x1.8 N/A N/A Measurements L x W x D (cm) 0.424 N/A N/A A (cm) : rea 0.763 N/A N/A Volume (cm) : -35.00% N/A N/A % Reduction in A rea: -143.00% N/A N/A % Reduction in Volume: 11 Position 1 (o'clock): 2.7 Maximum Distance 1 (cm): Yes N/A N/A Tunneling: Grade 3 N/A N/A Classification: Medium N/A N/A Exudate A mount: Serosanguineous N/A N/A Exudate Type: red, brown N/A N/A Exudate Color: Distinct, outline attached N/A N/A Wound Margin: Large (67-100%) N/A N/A Granulation A mount: Pink, Pale N/A N/A Granulation Quality: Small (1-33%) N/A N/A Necrotic A mount: Fat Layer (Subcutaneous Tissue): Yes N/A N/A Exposed Structures: Fascia: No Tendon: No Hoffmann, Kathlyn ([id number]) [id number] Page 5 of 10 Muscle: No Joint: No Bone: No None N/A N/A Epithelialization: Excoriation: No N/A N/A Periwound Skin  Texture: Induration: No Callus: No Crepitus: No Rash: No Scarring: No Maceration: No N/A N/A Periwound Skin Moisture: Dry/Scaly: No Atrophie Blanche: No N/A N/A Periwound Skin Color: Cyanosis: No Ecchymosis: No Erythema: No Hemosiderin Staining: No Mottled: No Pallor: No Rubor: No Treatment Notes Wound #17 (Foot) Wound Laterality: Plantar, Right Cleanser Wound Cleanser Discharge Instruction: Cleanse the wound with wound cleanser prior to applying a clean dressing using gauze sponges, not tissue or cotton balls. Peri-Wound Care Skin Prep Discharge Instruction: Use skin prep as directed Topical Primary Dressing Hydrofera Blue Ready Transfer Foam, 2.5x2.5 (in/in) Discharge Instruction: Apply directly to wound bed as directed Secondary Dressing Optifoam Non-Adhesive Dressing, 4x4 in Discharge Instruction: Apply over primary dressing as a foam donut. Woven Gauze Sponges 2x2 in Discharge Instruction: Apply over primary dressing as directed. Secured With [employer], 4.5x3.1 (in/yd) Discharge Instruction: Secure with Kerlix as directed. 42M Medipore H Soft Cloth Surgical T ape, 4 x 10 (in/yd) Discharge Instruction: Secure with tape as directed. Fish net Discharge Instruction: apply over the kerlix. Compression Wrap Compression Stockings Add-Ons Electronic Signature(s) Signed: 04/16/2023 4:29:34 PM By: Geralyn Corwin DO Entered By: Geralyn Corwin on 04/16/2023 16:07:34 -------------------------------------------------------------------------------- Multi-Disciplinary Care Plan Details Patient Name: Date of Service: [name], [name] 04/16/2023 3:00 PM Medical Record Number: [id number] Patient Account Number: [id number] Date of Birth/Sex: Treating RN: 1965/12/14 (58 y.o. [address] [place], [place] ([id number]) [id number] Page 6 of 10 Primary Care Lamari Beckles: [name] Other Clinician: Referring Kaylianna Detert: Treating Newt Levingston/Extender:  [name] in Treatment: 1 Active Inactive HBO Nursing Diagnoses: Anxiety related to feelings of confinement associated with the hyperbaric oxygen chamber Goals: Patient and/or family will be able to state/discuss factors appropriate to the management of their disease process during treatment Date Initiated: 04/09/2023 Target Resolution Date: 04/27/2023 Goal Status: Active Interventions: Assess and provide for patients comfort related to the hyperbaric environment and equalization of middle ear Notes: Necrotic Tissue Nursing Diagnoses: Knowledge deficit related to management of necrotic/devitalized tissue Goals: Necrotic/devitalized tissue will be minimized in the wound bed Date Initiated: 04/09/2023 Target Resolution Date: 04/27/2023 Goal Status: Active Patient/caregiver will verbalize understanding of reason and process for debridement of necrotic tissue Date Initiated: 04/09/2023 Target Resolution Date: 04/20/2023 Goal Status: Active Interventions: Assess patient pain level pre-, during and post procedure and prior to discharge Provide education on necrotic tissue and debridement process Treatment Activities: Apply topical anesthetic as ordered : 04/09/2023 Notes: Wound/Skin Impairment Nursing Diagnoses: Knowledge deficit related to ulceration/compromised skin integrity Goals: Patient/caregiver will verbalize understanding of skin care regimen Date  Initiated: 04/09/2023 Target Resolution Date: 04/27/2023 Goal Status: Active Interventions: Assess patient/caregiver ability to perform ulcer/skin care regimen upon admission and as needed Assess ulceration(s) every visit Provide education on ulcer and skin care Screen for HBO Treatment Activities: Skin care regimen initiated : 04/09/2023 Topical wound management initiated : 04/09/2023 Notes: Electronic Signature(s) Signed: 04/16/2023 5:35:38 PM By: [name] RN, BSN Entered By: [name] on  04/16/2023 15:12:42 Pestka, Keonta ([id number]) [id number] Page 7 of 10 -------------------------------------------------------------------------------- Pain Assessment Details Patient Name: Date of Service: [name], [name] 04/16/2023 3:00 PM Medical Record Number: [id number] Patient Account Number: [id number] Date of Birth/Sex: Treating RN: 1966-07-12 (57 y.o. [name], [name] Primary Care Mandie Crabbe: [name] Other Clinician: Referring Royetta Probus: Treating Hayven Croy/Extender: [name] in Treatment: 1 Active Problems Location of Pain Severity and Description of Pain Patient Has Paino No Site Locations Pain Management and Medication Current Pain Management: Electronic Signature(s) Signed: 04/16/2023 5:35:38 PM By: [name] RN, BSN Entered By: [name] on 04/16/2023 15:07:40 -------------------------------------------------------------------------------- Patient/Caregiver Education Details Patient Name: Date of Service: [name] 6/24/2024andnbsp3:00 PM Medical Record Number: [id number] Patient Account Number: [id number] Date of Birth/Gender: Treating RN: [birth year] (57 y.o. [name] Primary Care Physician: [name] Other Clinician: Referring Physician: Treating Physician/Extender: [name] in Treatment: 1 Education Assessment Education Provided To: Patient Education Topics Provided Wound/Skin Impairment: Handouts: Caring for Your Ulcer Methods: Explain/Verbal Responses: Reinforcements needed Dudziak, [name] ([id number]) [phone].pdf Page 8 of 10 Electronic Signature(s) Signed: 04/16/2023 5:35:38 PM By: [name] RN, BSN Entered By: [name] on 04/16/2023 15:16:53 -------------------------------------------------------------------------------- Wound Assessment Details Patient Name: Date of Service: [name], [name] 04/16/2023 3:00  PM Medical Record Number: [id number] Patient Account Number: [id number] Date of Birth/Sex: Treating RN: 1966/07/21 (57 y.o. [name], [name] Primary Care Donnavin Vandenbrink: [name] Other Clinician: Referring Franca Stakes: Treating Tillmon Kisling/Extender: [name] [name]: 1 Wound Status Wound Number: 17 Primary Diabetic Wound/Ulcer of the Lower Extremity Etiology: Wound Location: Right, Plantar Foot Wound Open Wounding Event: Blister Status: Date Acquired: 11/23/2022 Comorbid Anemia, Sleep Apnea, Hypotension, Type II Diabetes, Weeks Of Treatment: 1 History: Osteoarthritis, Neuropathy Clustered Wound: No Photos Wound Measurements Length: (cm) 0.9 Width: (cm) 0.6 Depth: (cm) 1.8 Area: (cm) 0.424 Volume: (cm) 0.763 % Reduction in Area: -35% % Reduction in Volume: -143% Epithelialization: None Tunneling: Yes Position (o'clock): 11 Maximum Distance: (cm) 2.7 Undermining: No Wound Description Classification: Grade 3 Wound Margin: Distinct, outline attached Exudate Amount: Medium Exudate Type: Serosanguineous Exudate Color: red, brown Foul Odor After Cleansing: No Slough/Fibrino Yes Wound Bed Granulation Amount: Large (67-100%) Exposed Structure Granulation Quality: Pink, Pale Fascia Exposed: No Necrotic Amount: Small (1-33%) Fat Layer (Subcutaneous Tissue) Exposed: Yes Necrotic Quality: Adherent Slough Tendon Exposed: No Muscle Exposed: No Joint Exposed: No Bone Exposed: No Periwound Skin Texture Texture Color [name], [name] ([id number]) [id number] Page 9 of 10 No Abnormalities Noted: No No Abnormalities Noted: No Callus: No Atrophie Blanche: No Crepitus: No Cyanosis: No Excoriation: No Ecchymosis: No Induration: No Erythema: No Rash: No Hemosiderin Staining: No Scarring: No Mottled: No Pallor: No Moisture Rubor: No No Abnormalities Noted: No Dry / Scaly: No Maceration: No Treatment Notes Wound #17  (Foot) Wound Laterality: Plantar, Right Cleanser Wound Cleanser Discharge Instruction: Cleanse the wound with wound cleanser prior to applying a clean dressing using gauze sponges, not tissue or cotton balls. Peri-Wound Care Skin Prep Discharge Instruction: Use skin prep as directed Topical Primary Dressing Hydrofera Blue Ready Transfer Foam, 2.5x2.5 (in/in) Discharge Instruction:  Apply directly to wound bed as directed Secondary Dressing Optifoam Non-Adhesive Dressing, 4x4 in Discharge Instruction: Apply over primary dressing as a foam donut. Woven Gauze Sponges 2x2 in Discharge Instruction: Apply over primary dressing as directed. Secured With [employer], 4.5x3.1 (in/yd) Discharge Instruction: Secure with Kerlix as directed. 65M Medipore H Soft Cloth Surgical T ape, 4 x 10 (in/yd) Discharge Instruction: Secure with tape as directed. Fish net Discharge Instruction: apply over the kerlix. Compression Wrap Compression Stockings Add-Ons Electronic Signature(s) Signed: 04/16/2023 5:35:38 PM By: [name] RN, BSN Entered By: [name] on 04/16/2023 15:11:53 -------------------------------------------------------------------------------- Vitals Details Patient Name: Date of Service: [name], [name] 04/16/2023 3:00 PM Medical Record Number: [id number] Patient Account Number: [id number] Date of Birth/Sex: Treating RN: [birth year] (57 y.o. [name] Primary Care Betsi Crespi: [name] Other Clinician: Referring Wess Baney: Treating Kamil Mchaffie/Extender: [name] [name]: 1 Vital Signs Time Taken: 15:05 Temperature (F): 98.5 Newhard, Jaianna ([id number]) [phone].pdf Page 10 of 10 Height (in): 64 Pulse (bpm): 87 Weight (lbs): 225 Respiratory Rate (breaths/min): 16 Body Mass Index (BMI): 38.6 Blood Pressure (mmHg): 117/74 Reference Range: 80 - 120 mg / dl Electronic Signature(s) Signed: 04/16/2023 5:35:38  PM By: [name] RN, BSN Entered By: [name] on 04/16/2023 15:07:33

## 2023-04-19 ENCOUNTER — Encounter: Payer: Self-pay | Admitting: Family Medicine

## 2023-04-19 ENCOUNTER — Encounter (HOSPITAL_BASED_OUTPATIENT_CLINIC_OR_DEPARTMENT_OTHER): Payer: BC Managed Care – PPO | Admitting: Internal Medicine

## 2023-04-19 ENCOUNTER — Encounter (HOSPITAL_COMMUNITY): Payer: Self-pay | Admitting: Physical Therapy

## 2023-04-19 DIAGNOSIS — Z89422 Acquired absence of other left toe(s): Secondary | ICD-10-CM

## 2023-04-19 DIAGNOSIS — E11621 Type 2 diabetes mellitus with foot ulcer: Secondary | ICD-10-CM | POA: Diagnosis not present

## 2023-04-19 DIAGNOSIS — Z89421 Acquired absence of other right toe(s): Secondary | ICD-10-CM | POA: Diagnosis not present

## 2023-04-19 DIAGNOSIS — L97513 Non-pressure chronic ulcer of other part of right foot with necrosis of muscle: Secondary | ICD-10-CM

## 2023-04-19 LAB — GLUCOSE, CAPILLARY
Glucose-Capillary: 177 mg/dL — ABNORMAL HIGH (ref 70–99)
Glucose-Capillary: 165 mg/dL — ABNORMAL HIGH (ref 70–99)

## 2023-04-19 NOTE — Progress Notes (Addendum)
MISCHA, BRITTINGHAM ([id number]) [id number] Page 1 of 2 Visit Report for 04/19/2023 Arrival Information Details Patient Name: Date of Service: [name], [name] 04/19/2023 12:00 PM Medical Record Number: [id number] Patient Account Number: [id number] Date of Birth/Sex: Treating RN: 1966-05-18 (57 y.o. [name] Primary Care [name]: [name] Other Clinician: Karl [name] Referring [name]: Treating [name]/Extender: [name] in Treatment: 1 Visit Information History Since Last Visit All ordered tests and consults were completed: Yes Patient Arrived: Wheel Chair Added or deleted any medications: No Arrival Time: 11:51 Any new allergies or adverse reactions: No Accompanied By: Dad Had a fall or experienced change in No Transfer Assistance: None activities of daily living that may affect Patient Identification Verified: Yes risk of falls: Secondary Verification Process Completed: Yes Signs or symptoms of abuse/neglect since last visito No Patient Requires Transmission-Based Precautions: No Hospitalized since last visit: No Patient Has Alerts: Yes Implantable device outside of the clinic excluding No Patient Alerts: Patient on Blood Thinner cellular tissue based products placed in the center Heparin since last visit: Pain Present Now: No Electronic Signature(s) Signed: 04/19/2023 2:24:17 PM By: [name] EMT Entered By: [name] on 04/19/2023 14:24:17 -------------------------------------------------------------------------------- Encounter Discharge Information Details Patient Name: Date of Service: [name], [name] 04/19/2023 12:00 PM Medical Record Number: [id number] Patient Account Number: [id number] Date of Birth/Sex: Treating RN: [birth year] (57 y.o. [name] Primary Care [name]: [name] Other Clinician: Karl [name] Referring [name]: Treating [name]/Extender: [name] in Treatment: 1 Encounter Discharge Information Items Discharge Condition: Stable Ambulatory Status: Wheelchair Discharge Destination: Home Transportation: Private Auto Accompanied By: Dad Schedule Follow-up Appointment: Yes Clinical Summary of Care: Electronic Signature(s) Signed: 04/19/2023 4:00:12 PM By: [name] EMT Entered By: [name] on 04/19/2023 16:00:12 [name], [name] ([id number]) [id number] Page 2 of 2 -------------------------------------------------------------------------------- Vitals Details Patient Name: Date of Service: [name], [name] 04/19/2023 12:00 PM Medical Record Number: [id number] Patient Account Number: [id number] Date of Birth/Sex: Treating RN: 1966-06-26 (57 y.o. [name] Primary Care [name]: [name] Other Clinician: Karl [name] Referring [name]: Treating [name]/Extender: [name] in Treatment: 1 Vital Signs Time Taken: 12:32 Temperature (F): 97.2 Height (in): 64 Pulse (bpm): 76 Weight (lbs): 225 Respiratory Rate (breaths/min): 18 Body Mass Index (BMI): 38.6 Blood Pressure (mmHg): 148/88 Capillary Blood Glucose (mg/dl): 616 Reference Range: 80 - 120 mg / dl Electronic Signature(s) Signed: 04/19/2023 2:25:19 PM By: [name] EMT Entered By: [name] on 04/19/2023 14:25:19

## 2023-04-19 NOTE — Progress Notes (Signed)
DELORIA, BRASSFIELD ([id number]) [id number] Page 1 of 2 Visit Report for 04/19/2023 Problem List Details Patient Name: Date of Service: [name], [name] 04/19/2023 12:00 PM Medical Record Number: [id number] Patient Account Number: [id number] Date of Birth/Sex: Treating RN: 03/11/1966 (57 y.o. 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) Z89.422 Acquired absence of other  left toe(s) Facility Procedures : CPT4 Code Description: [id number] G0277-(Facility Use Only) HBOT full body chamber, , ICD-10 Diagnosis Description 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.621 Type 2 diabetes mellitus with foot ulcer Z89.421 Acquired  absence of other right toe(s) Z89.422 Acquired absence of other left toe(s) Modifier: necrosis of Quantity: 4 muscle Physician Procedures : CPT4 Code Description Modifier [id number] - WC PHYS HYPERBARIC OXYGEN THERAPY ICD-10 Diagnosis Description 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

## 2023-04-19 NOTE — Progress Notes (Addendum)
Audubon, [name] ([id number]) [id number] Page 1 of 2 Visit Report for 04/19/2023 HBO Details Patient Name: Date of Service: [name], [name] 04/19/2023 12:00 PM Medical Record Number: [id number] Patient Account Number: [id number] Date of Birth/Sex: Treating RN: 04-16-1966 (57 y.o. Katrinka Blazing Primary Care Anastyn Ayars: [name] Other Clinician: Karl Bales Referring Rhylie Stehr: Treating Keerat Denicola/Extender: [name] in Treatment: 1 HBO Treatment Course Details Treatment Course Number: 1 Ordering Catharine Kettlewell: Geralyn Corwin T Treatments Ordered: otal 40 HBO Treatment Start Date: 04/19/2023 HBO Indication: Diabetic Ulcer(s) of the Lower Extremity Standard/Conservative Wound Care tried and failed greater than or equal to 30 days Wound #17 Right, Plantar Foot HBO Treatment Details Treatment Number: 1 Patient Type: Outpatient Chamber Type: Monoplace Chamber Serial #: [id number] Treatment Protocol: 2.0 ATA with 90 minutes oxygen, with two 5 minute air breaks Treatment Details Compression Rate Down: 1.0 psi / minute De-Compression Rate Up: 1.0 psi / minute A breaks and breathing ir Compress Tx Pressure periods Decompress Decompress Begins Reached (leave unused spaces Begins Ends blank) Chamber Pressure (ATA 1 2 2 2 2 2  --2 1 ) Clock Time (24 hr) 13:06 13:18 13:48 13:53 14:23 14:27 - - 14:58 15:14 Treatment Length: 128 (minutes) Treatment Segments: 4 Vital Signs Capillary Blood Glucose Reference Range: 80 - 120 mg / dl HBO Diabetic Blood Glucose Intervention Range: <131 mg/dl or >086 mg/dl Time Vitals Blood Respiratory Capillary Blood Glucose Pulse Action Type: Pulse: Temperature: Taken: Pressure: Rate: Glucose (mg/dl): Meter #: Oximetry (%) Taken: Pre 12:32 148/88 76 18 97.2 177 Post 15:20 126/78 83 18 96.9 165 Treatment Response Treatment Toleration: Well Treatment Completion Status: Treatment Completed without Adverse  Event Treatment Notes Today was the patient first treatment. The patient was seen by Dr. Mikey Bussing before the treatment was started. The patient had no problems noted during her treatment today. Additional Procedure Documentation Tissue Sevierity: Necrosis of muscle Physician HBO Attestation: I certify that I supervised this HBO treatment in accordance with Medicare guidelines. A trained emergency response team is readily available per Yes hospital policies and procedures. Continue HBOT as ordered. Yes Electronic Signature(s) Signed: 04/19/2023 4:14:51 PM By: Geralyn Corwin DO Previous Signature: 04/19/2023 3:58:44 PM Version By: Karl Bales EMT Previous Signature: 04/19/2023 2:31:11 PM Version By: Karl Bales EMT Entered By: Geralyn Corwin on 04/19/2023 16:14:06 [name], [name] ([id number]) [id number] Page 2 of 2 -------------------------------------------------------------------------------- HBO Safety Checklist Details Patient Name: Date of Service: [name], [name] 04/19/2023 12:00 PM Medical Record Number: [id number] Patient Account Number: [id number] Date of Birth/Sex: Treating RN: 06-20-1966 (57 y.o. Katrinka Blazing Primary Care Amarie Viles: [name] Other Clinician: Karl Bales Referring Reni Hausner: Treating Broghan Pannone/Extender: [name] in Treatment: 1 HBO Safety Checklist Items Safety Checklist Consent Form Signed Patient voided / foley secured and emptied When did you last eato 1115 Last dose of injectable or oral agent Last night Ostomy pouch emptied and vented if applicable NA All implantable devices assessed, documented and approved NA Intravenous access site secured and place PICC Line Valuables secured Linens and cotton and cotton/polyester blend (less than 51% polyester) Personal oil-based products / skin lotions / body lotions removed Wigs or hairpieces removed Smoking or tobacco materials  removed Books / newspapers / magazines / loose paper removed Cologne, aftershave, perfume and deodorant removed Jewelry removed (may wrap wedding band) Make-up removed Hair care products removed Battery operated devices (external) removed Heating patches and chemical warmers removed Titanium eyewear removed NA Nail polish cured greater than 10 hours 3 days ago Haematologist  cured greater than 10 hours NA Hearing aids removed NA Loose dentures or partials removed NA Prosthetics have been removed NA Patient demonstrates correct use of air break device (if applicable) Patient concerns have been addressed Patient grounding bracelet on and cord attached to chamber Specifics for Inpatients (complete in addition to above) Medication sheet sent with patient NA Intravenous medications needed or due during therapy sent with patient NA Drainage tubes (e.g. nasogastric tube or chest tube secured and vented) NA Endotracheal or Tracheotomy tube secured NA Cuff deflated of air and inflated with saline NA Airway suctioned NA Notes The safety checklist was done before the treatment was started. Electronic Signature(s) Signed: 04/19/2023 2:27:54 PM By: Karl Bales EMT Entered By: Karl Bales on 04/19/2023 14:27:54

## 2023-04-19 NOTE — Therapy (Signed)
PHYSICAL THERAPY DISCHARGE SUMMARY  Visits from Start of Care: 15  Current functional level related to goals / functional outcomes: Pt had multiple wounds now has one   Remaining deficits: LE began being hot and painful   Education / Equipment: nonw   Patient agrees to discharge. Patient goals were partially met. Patient is being discharged due to a change in medical status.   Pt is going to have IV antibiotics followed by hyperbaric room to attempt to heal last wound.  Virgina Organ, PT CLT [phone]

## 2023-04-20 ENCOUNTER — Encounter (HOSPITAL_BASED_OUTPATIENT_CLINIC_OR_DEPARTMENT_OTHER): Payer: BC Managed Care – PPO | Admitting: Internal Medicine

## 2023-04-20 DIAGNOSIS — E11621 Type 2 diabetes mellitus with foot ulcer: Secondary | ICD-10-CM | POA: Diagnosis not present

## 2023-04-20 DIAGNOSIS — L97513 Non-pressure chronic ulcer of other part of right foot with necrosis of muscle: Secondary | ICD-10-CM

## 2023-04-20 DIAGNOSIS — Z89421 Acquired absence of other right toe(s): Secondary | ICD-10-CM | POA: Diagnosis not present

## 2023-04-20 LAB — GLUCOSE, CAPILLARY
Glucose-Capillary: 201 mg/dL — ABNORMAL HIGH (ref 70–99)
Glucose-Capillary: 123 mg/dL — ABNORMAL HIGH (ref 70–99)

## 2023-04-20 NOTE — Progress Notes (Signed)
[name] ([id number]) [id number] Page 1 of 2 Visit Report for 04/20/2023 Arrival Information Details Patient Name: Date of Service: [name] 04/20/2023 10:00 A M Medical Record Number: [id number] Patient Account Number: [id number] Date of Birth/Sex: Treating RN: 12-Mar-1966 (57 y.o. Fredderick [name] Primary Care [name]: [name] Other Clinician: Haywood [name] Referring [name]: Treating Santrice Muzio/Extender: [name] in Treatment: 1 Visit Information History Since Last Visit All ordered tests and consults were completed: Yes Patient Arrived: Wheel Chair Added or deleted any medications: No Arrival Time: 10:02 Any new allergies or adverse reactions: No Accompanied By: father Had a fall or experienced change in No Transfer Assistance: None activities of daily living that may affect Patient Identification Verified: Yes risk of falls: Secondary Verification Process Completed: Yes Signs or symptoms of abuse/neglect since last visito No Patient Requires Transmission-Based Precautions: No Hospitalized since last visit: No Patient Has Alerts: Yes Implantable device outside of the clinic excluding No Patient Alerts: Patient on Blood Thinner cellular tissue based products placed in the center Heparin since last visit: Pain Present Now: No Electronic Signature(s) Signed: 04/20/2023 1:46:54 PM By: [name] CHT EMT BS , , Previous Signature: 04/20/2023 1:46:12 PM Version By: [name] CHT EMT BS , , Entered By: [name] on 04/20/2023 13:46:53 -------------------------------------------------------------------------------- Encounter Discharge Information Details Patient Name: Date of Service: [name], [name] 04/20/2023 10:00 A M Medical Record Number: [id number] Patient Account Number: [id number] Date of Birth/Sex: Treating RN: [birth year] (57 y.o. Fredderick [name] Primary Care  [name]: [name] Other Clinician: Haywood [name] Referring [name]: Treating [name]/Extender: [name] in Treatment: 1 Encounter Discharge Information Items Discharge Condition: Stable Ambulatory Status: Wheelchair Discharge Destination: Home Transportation: Private Auto Accompanied By: father Schedule Follow-up Appointment: No Clinical Summary of Care: Electronic Signature(s) Signed: 04/20/2023 1:57:25 PM By: [name] CHT EMT BS , , Entered By: [name] on 04/20/2023 13:57:25 Abarca, Lilibeth ([id number]) [id number] Page 2 of 2 -------------------------------------------------------------------------------- Vitals Details Patient Name: Date of Service: [name], [name] 04/20/2023 10:00 A M Medical Record Number: [id number] Patient Account Number: [id number] Date of Birth/Sex: Treating RN: December 07, 1965 (57 y.o. Fredderick [name] Primary Care [name]: [name] Other Clinician: Haywood [name] Referring [name]: Treating [name]/Extender: [name] in Treatment: 1 Vital Signs Time Taken: 10:11 Temperature (F): 98.4 Height (in): 64 Pulse (bpm): 81 Weight (lbs): 225 Respiratory Rate (breaths/min): 18 Body Mass Index (BMI): 38.6 Blood Pressure (mmHg): 107/77 Capillary Blood Glucose (mg/dl): 416 Reference Range: 80 - 120 mg / dl Electronic Signature(s) Signed: 04/20/2023 1:46:36 PM By: [name] CHT EMT BS , , Entered By: [name] on 04/20/2023 13:46:36

## 2023-04-20 NOTE — Progress Notes (Signed)
Lugoff, [name] ([id number]) [id number] Page 1 of 2 Visit Report for 04/20/2023 HBO Details Patient Name: Date of Service: [name], [name] 04/20/2023 10:00 A M Medical Record Number: [id number] Patient Account Number: [id number] Date of Birth/Sex: Treating RN: 05/28/1966 (57 y.o. [name] Primary Care Johnathon Mittal: [name] Other Clinician: Haywood Pao Referring Zaydah Nawabi: Treating Leonie Amacher/Extender: [name] in Treatment: 1 HBO Treatment Course Details Treatment Course Number: 1 Ordering Oval Moralez: Geralyn Corwin T Treatments Ordered: otal 40 HBO Treatment Start Date: 04/19/2023 HBO Indication: Diabetic Ulcer(s) of the Lower Extremity Standard/Conservative Wound Care tried and failed greater than or equal to 30 days Wound #17 Right, Plantar Foot HBO Treatment Details Treatment Number: 2 Patient Type: Outpatient Chamber Type: Monoplace Chamber Serial #: [id number] Treatment Protocol: 2.0 ATA with 90 minutes oxygen, with two 5 minute air breaks Treatment Details Compression Rate Down: 1.0 psi / minute De-Compression Rate Up: 1.0 psi / minute A breaks and breathing ir Compress Tx Pressure periods Decompress Decompress Begins Reached (leave unused spaces Begins Ends blank) Chamber Pressure (ATA 1 2 2 2 2 2  --2 1 ) Clock Time (24 hr) 10:41 11:01 11:31 11:36 12:06 12:11 - - 12:41 12:56 Treatment Length: 135 (minutes) Treatment Segments: 4 Vital Signs Capillary Blood Glucose Reference Range: 80 - 120 mg / dl HBO Diabetic Blood Glucose Intervention Range: <131 mg/dl or >782 mg/dl Type: Time Vitals Blood Respiratory Capillary Blood Glucose Pulse Action Pulse: Temperature: Taken: Pressure: Rate: Glucose (mg/dl): Meter #: Oximetry (%) Taken: Pre 10:11 107/77 81 18 98.4 201 1 none per protocol Post 13:10 111/83 77 20 98.1 123 1 none per protocol Treatment Response Treatment Toleration: Well Treatment  Completion Status: Treatment Completed without Adverse Event Treatment Notes [name] arrived with father with normal vital signs, stating she has eaten prior to arriving. She prepared for treatment. After performing a safety check, she was placed in the chamber which was compressed at a rate set of 1 psi/min slowed by ventilation rate of full 450 Lpm to approximately 0.74 psi/min due to this being her second treatment and safety for ear equalization. She tolerated the treatment and subsequent decompression at a rate set of 1 psi/min ( ventilation rate at 275 Lpm). She denied any issues with ear equalization and/or pain related to barotrauma. Her post-treatment vital signs were within normal range. She was stable upon discharge. Additional Procedure Documentation Tissue Sevierity: Necrosis of muscle Physician HBO Attestation: I certify that I supervised this HBO treatment in accordance with Medicare guidelines. A trained emergency response team is readily available per Yes hospital policies and procedures. Continue HBOT as ordered. Yes Electronic Signature(s) Signed: 04/23/2023 10:26:47 AM By: Kimber Relic, [name] ([id number]) [id number] Page 2 of 2 Signed: 04/23/2023 10:26:47 AM By: Geralyn Corwin DO Previous Signature: 04/20/2023 1:55:49 PM Version By: Haywood Pao CHT EMT BS , , Entered By: Geralyn Corwin on 04/23/2023 10:25:28 -------------------------------------------------------------------------------- HBO Safety Checklist Details Patient Name: Date of Service: [name], [name] 04/20/2023 10:00 A M Medical Record Number: [id number] Patient Account Number: [id number] Date of Birth/Sex: Treating RN: 1966/03/20 (57 y.o. [name] Primary Care Derelle Cockrell: [name] Other Clinician: Haywood Pao Referring Taha Dimond: Treating Jeanet Lupe/Extender: [name] in Treatment: 1 HBO Safety Checklist  Items Safety Checklist Consent Form Signed Patient voided / foley secured and emptied When did you last eato 0830 Last dose of injectable or oral agent Last night metformin Ostomy pouch emptied and vented if applicable NA All implantable devices assessed, documented and  approved pain pump Intravenous access site secured and place NA Valuables secured NA Linens and cotton and cotton/polyester blend (less than 51% polyester) Personal oil-based products / skin lotions / body lotions removed Wigs or hairpieces removed NA Smoking or tobacco materials removed NA Books / newspapers / magazines / loose paper removed Cologne, aftershave, perfume and deodorant removed Jewelry removed (may wrap wedding band) Make-up removed Hair care products removed Battery operated devices (external) removed Heating patches and chemical warmers removed Titanium eyewear removed Nail polish cured greater than 10 hours greater than 10 hours Casting material cured greater than 10 hours NA Hearing aids removed NA Loose dentures or partials removed NA Prosthetics have been removed NA Patient demonstrates correct use of air break device (if applicable) Patient concerns have been addressed Patient grounding bracelet on and cord attached to chamber Specifics for Inpatients (complete in addition to above) Medication sheet sent with patient NA Intravenous medications needed or due during therapy sent with patient NA Drainage tubes (e.g. nasogastric tube or chest tube secured and vented) NA Endotracheal or Tracheotomy tube secured NA Cuff deflated of air and inflated with saline NA Airway suctioned NA Electronic Signature(s) Signed: 04/20/2023 1:48:28 PM By: Haywood Pao CHT EMT BS , , Entered By: Haywood Pao on 04/20/2023 13:48:27

## 2023-04-23 ENCOUNTER — Encounter (HOSPITAL_BASED_OUTPATIENT_CLINIC_OR_DEPARTMENT_OTHER): Payer: BC Managed Care – PPO | Attending: Internal Medicine | Admitting: Internal Medicine

## 2023-04-23 DIAGNOSIS — Z89421 Acquired absence of other right toe(s): Secondary | ICD-10-CM | POA: Insufficient documentation

## 2023-04-23 DIAGNOSIS — L97513 Non-pressure chronic ulcer of other part of right foot with necrosis of muscle: Secondary | ICD-10-CM | POA: Insufficient documentation

## 2023-04-23 DIAGNOSIS — Z89422 Acquired absence of other left toe(s): Secondary | ICD-10-CM | POA: Diagnosis not present

## 2023-04-23 DIAGNOSIS — E11621 Type 2 diabetes mellitus with foot ulcer: Secondary | ICD-10-CM | POA: Diagnosis present

## 2023-04-23 LAB — GLUCOSE, CAPILLARY: Glucose-Capillary: 179 mg/dL — ABNORMAL HIGH (ref 70–99)

## 2023-04-23 NOTE — Progress Notes (Signed)
ANGELICAMARIE, CARNATHAN ([id number]) [id number] Page 1 of 2 Visit Report for 04/23/2023 Arrival Information Details Patient Name: Date of Service: [name], [name] 04/23/2023 10:00 A M Medical Record Number: [id number] Patient Account Number: [id number] Date of Birth/Sex: Treating RN: 11-11-1965 (57 y.o. Katrinka Blazing Primary Care Koen Antilla: [name] Other Clinician: Karl Bales Referring Adel Neyer: Treating Kately Graffam/Extender: [name] in Treatment: 2 Visit Information History Since Last Visit All ordered tests and consults were completed: Yes Patient Arrived: Wheel Chair Added or deleted any medications: No Arrival Time: 09:27 Any new allergies or adverse reactions: No Accompanied By: father Had a fall or experienced change in No Transfer Assistance: None activities of daily living that may affect Patient Identification Verified: Yes risk of falls: Secondary Verification Process Completed: Yes Signs or symptoms of abuse/neglect since last visito No Patient Requires Transmission-Based Precautions: No Hospitalized since last visit: No Patient Has Alerts: Yes Implantable device outside of the clinic excluding No Patient Alerts: Patient on Blood Thinner cellular tissue based products placed in the center Heparin since last visit: Pain Present Now: Unable to Respond Electronic Signature(s) Signed: 04/23/2023 3:16:21 PM By: Karl Bales EMT Entered By: Karl Bales on 04/23/2023 15:16:21 -------------------------------------------------------------------------------- Encounter Discharge Information Details Patient Name: Date of Service: [name], [name] 04/23/2023 10:00 A M Medical Record Number: [id number] Patient Account Number: [id number] Date of Birth/Sex: Treating RN: [birth year] (57 y.o. Katrinka Blazing Primary Care Beva Remund: [name] Other Clinician: Karl Bales Referring Keeana Pieratt: Treating Angles Trevizo/Extender:  [name] in Treatment: 2 Encounter Discharge Information Items Discharge Condition: Stable Ambulatory Status: Wheelchair Discharge Destination: Home Transportation: Private Auto Accompanied By: father Schedule Follow-up Appointment: Yes Clinical Summary of Care: Electronic Signature(s) Signed: 04/23/2023 3:21:01 PM By: Karl Bales EMT Entered By: Karl Bales on 04/23/2023 15:21:01 Bazinet, [name] ([id number]) [id number] Page 2 of 2 -------------------------------------------------------------------------------- Vitals Details Patient Name: Date of Service: [name], [name] 04/23/2023 10:00 A M Medical Record Number: [id number] Patient Account Number: [id number] Date of Birth/Sex: Treating RN: 1965-11-28 (57 y.o. Katrinka Blazing Primary Care Jibril Mcminn: [name] Other Clinician: Karl Bales Referring Sable Knoles: Treating Christeen Lai/Extender: [name] in Treatment: 2 Vital Signs Time Taken: 09:37 Temperature (F): 97.1 Height (in): 64 Pulse (bpm): 81 Weight (lbs): 225 Respiratory Rate (breaths/min): 18 Body Mass Index (BMI): 38.6 Blood Pressure (mmHg): 103/74 Capillary Blood Glucose (mg/dl): 416 Reference Range: 80 - 120 mg / dl Electronic Signature(s) Signed: 04/23/2023 3:17:03 PM By: Karl Bales EMT Entered By: Karl Bales on 04/23/2023 15:17:03

## 2023-04-23 NOTE — Progress Notes (Signed)
SALENE, CARDINAL ([id number]) [id number] Page 1 of 2 Visit Report for 04/23/2023 Problem List Details Patient Name: Date of Service: [name], [name] 04/23/2023 10:00 A M Medical Record Number: [id number] Patient Account Number: [id number] Date of Birth/Sex: Treating RN: 1966/08/07 (57 y.o. 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) Z89.422 Acquired absence of other left  toe(s) Facility Procedures : CPT4 Code Description: [id number] G0277-(Facility Use Only) HBOT full body chamber, , ICD-10 Diagnosis Description E11.621 Type 2 diabetes mellitus with foot ulcer 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

## 2023-04-23 NOTE — Progress Notes (Addendum)
Brady, [name] ([id number]) [id number] Page 1 of 2 Visit Report for 04/23/2023 HBO Details Patient Name: Date of Service: [name], [name] 04/23/2023 10:00 A M Medical Record Number: [id number] Patient Account Number: [id number] Date of Birth/Sex: Treating RN: December 14, 1965 (57 y.o. Katrinka Blazing Primary Care Ayda Tancredi: [name] Other Clinician: Karl Bales Referring Heron Pitcock: Treating Matthews Franks/Extender: [name] in Treatment: 2 HBO Treatment Course Details Treatment Course Number: 1 Ordering Bradley Bostelman: Geralyn Corwin T Treatments Ordered: otal 40 HBO Treatment Start Date: 04/19/2023 HBO Indication: Diabetic Ulcer(s) of the Lower Extremity Standard/Conservative Wound Care tried and failed greater than or equal to 30 days Wound #17 Right, Plantar Foot HBO Treatment Details Treatment Number: 3 Patient Type: Outpatient Chamber Type: Monoplace Chamber Serial #: [id number] Treatment Protocol: 2.0 ATA with 90 minutes oxygen, with two 5 minute air breaks Treatment Details Compression Rate Down: 1.0 psi / minute De-Compression Rate Up: 1.5 psi / minute A breaks and breathing ir Compress Tx Pressure periods Decompress Decompress Begins Reached (leave unused spaces Begins Ends blank) Chamber Pressure (ATA 1 2 2 2 2 2  --2 1 ) Clock Time (24 hr) 10:26 10:42 11:12 11:17 11:47 11:52 - - 12:22 12:33 Treatment Length: 127 (minutes) Treatment Segments: 4 Vital Signs Capillary Blood Glucose Reference Range: 80 - 120 mg / dl HBO Diabetic Blood Glucose Intervention Range: <131 mg/dl or >782 mg/dl Time Vitals Blood Respiratory Capillary Blood Glucose Pulse Action Type: Pulse: Temperature: Taken: Pressure: Rate: Glucose (mg/dl): Meter #: Oximetry (%) Taken: Pre 09:37 103/74 81 18 97.1 179 Post 12:39 111/83 75 20 97.2 142 Pre 10:20 109/73 78 Treatment Response Treatment Toleration: Well Treatment Completion Status: Treatment  Completed without Adverse Event Additional Procedure Documentation Tissue Sevierity: Necrosis of muscle Physician HBO Attestation: I certify that I supervised this HBO treatment in accordance with Medicare guidelines. A trained emergency response team is readily available per Yes hospital policies and procedures. Continue HBOT as ordered. Yes Electronic Signature(s) Signed: 04/23/2023 4:20:44 PM By: Geralyn Corwin DO Previous Signature: 04/23/2023 3:20:00 PM Version By: Karl Bales EMT Entered By: Geralyn Corwin on 04/23/2023 16:17:46 Hefel, Abiha ([id number]) [id number] Page 2 of 2 -------------------------------------------------------------------------------- HBO Safety Checklist Details Patient Name: Date of Service: AMINA, MAGNUSON 04/23/2023 10:00 A M Medical Record Number: [id number] Patient Account Number: [id number] Date of Birth/Sex: Treating RN: 09-Aug-1966 (57 y.o. Katrinka Blazing Primary Care Christine Schiefelbein: [name] Other Clinician: Karl Bales Referring Endora Teresi: Treating Angee Gupton/Extender: [name] in Treatment: 2 HBO Safety Checklist Items Safety Checklist Consent Form Signed Patient voided / foley secured and emptied When did you last eato 0830 Last dose of injectable or oral agent Last night Ostomy pouch emptied and vented if applicable NA All implantable devices assessed, documented and approved NA Spinal Stimulator cut off Intravenous access site secured and place PICC Line Valuables secured Linens and cotton and cotton/polyester blend (less than 51% polyester) Personal oil-based products / skin lotions / body lotions removed Wigs or hairpieces removed Human Extensions approved Smoking or tobacco materials removed Books / newspapers / magazines / loose paper removed Cologne, aftershave, perfume and deodorant removed Jewelry removed (may wrap wedding band) Make-up removed Hair care products  removed Battery operated devices (external) removed Heating patches and chemical warmers removed Titanium eyewear removed NA Nail polish cured greater than 10 hours Over 10 hours old Casting material cured greater than 10 hours NA Hearing aids removed NA Loose dentures or partials removed NA Prosthetics have been removed NA Patient demonstrates correct use of  air break device (if applicable) Patient concerns have been addressed Patient grounding bracelet on and cord attached to chamber Specifics for Inpatients (complete in addition to above) Medication sheet sent with patient NA Intravenous medications needed or due during therapy sent with patient NA Drainage tubes (e.g. nasogastric tube or chest tube secured and vented) NA Endotracheal or Tracheotomy tube secured NA Cuff deflated of air and inflated with saline NA Airway suctioned NA Notes The safety checklist was done before the treatment was started. Electronic Signature(s) Signed: 04/23/2023 3:18:28 PM By: Karl Bales EMT Entered By: Karl Bales on 04/23/2023 15:18:28

## 2023-04-23 NOTE — Progress Notes (Signed)
LIZANNE, REI ([id number]) [id number] Page 1 of 1 Visit Report for 04/20/2023 SuperBill Details Patient Name: Date of Service: [name], [name] 04/20/2023 Medical Record Number: [id number] Patient Account Number: [id number] Date of Birth/Sex: Treating RN: 1966/06/01 (57 y.o. 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) Z89.422 Acquired absence of other left toe(s) Facility Procedures CPT4 Code Description Modifier Quantity [id number]-(Facility Use Only) HBOT full body chamber, , 4 ICD-10 Diagnosis Description E11.621 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

## 2023-04-24 ENCOUNTER — Encounter (HOSPITAL_BASED_OUTPATIENT_CLINIC_OR_DEPARTMENT_OTHER): Payer: BC Managed Care – PPO | Admitting: General Surgery

## 2023-04-24 LAB — GLUCOSE, CAPILLARY: Glucose-Capillary: 142 mg/dL — ABNORMAL HIGH (ref 70–99)

## 2023-04-25 ENCOUNTER — Encounter (HOSPITAL_BASED_OUTPATIENT_CLINIC_OR_DEPARTMENT_OTHER): Payer: BC Managed Care – PPO | Admitting: Physician Assistant

## 2023-04-25 DIAGNOSIS — E11621 Type 2 diabetes mellitus with foot ulcer: Secondary | ICD-10-CM | POA: Diagnosis not present

## 2023-04-25 LAB — GLUCOSE, CAPILLARY
Glucose-Capillary: 201 mg/dL — ABNORMAL HIGH (ref 70–99)
Glucose-Capillary: 162 mg/dL — ABNORMAL HIGH (ref 70–99)

## 2023-04-25 LAB — LAB REPORT - SCANNED: EGFR: 96

## 2023-04-25 NOTE — Progress Notes (Addendum)
SYDNEY, MINOT ([id number]) [id number] Page 1 of 2 Visit Report for 04/25/2023 Arrival Information Details Patient Name: Date of Service: DANIYLAH, MIKKOLA 04/25/2023 10:00 A M Medical Record Number: [id number] Patient Account Number: [id number] Date of Birth/Sex: Treating RN: 03/02/1966 (57 y.o. [name] Primary Care Rondy Krupinski: [name] Other Clinician: Haywood Pao Referring Annalis Kaczmarczyk: Treating Ekam Besson/Extender: Criselda Peaches in Treatment: 2 Visit Information History Since Last Visit All ordered tests and consults were completed: Yes Patient Arrived: Wheel Chair Added or deleted any medications: No Arrival Time: 09:38 Any new allergies or adverse reactions: No Accompanied By: father Had a fall or experienced change in No Transfer Assistance: None activities of daily living that may affect Patient Identification Verified: Yes risk of falls: Secondary Verification Process Completed: Yes Signs or symptoms of abuse/neglect since last visito No Patient Requires Transmission-Based Precautions: No Hospitalized since last visit: No Patient Has Alerts: Yes Implantable device outside of the clinic excluding No Patient Alerts: Patient on Blood Thinner cellular tissue based products placed in the center Heparin since last visit: Pain Present Now: No Electronic Signature(s) Signed: 04/25/2023 12:28:33 PM By: Haywood Pao CHT EMT BS , , Entered By: Haywood Pao on 04/25/2023 12:28:33 -------------------------------------------------------------------------------- Encounter Discharge Information Details Patient Name: Date of Service: [name], Ameriah 04/25/2023 10:00 A M Medical Record Number: [id number] Patient Account Number: [id number] Date of Birth/Sex: Treating RN: July 13, 1966 (57 y.o. [name] Primary Care Amani Nodarse: [name] Other Clinician: Karl Bales Referring Ashrith Sagan: Treating  Horst Ostermiller/Extender: Criselda Peaches in Treatment: 2 Encounter Discharge Information Items Discharge Condition: Stable Ambulatory Status: Wheelchair Discharge Destination: Home Transportation: Private Auto Accompanied By: dad Schedule Follow-up Appointment: Yes Clinical Summary of Care: Electronic Signature(s) Signed: 04/25/2023 4:30:33 PM By: Karl Bales EMT Entered By: Karl Bales on 04/25/2023 16:30:33 Shehadeh, [name] ([id number]) [id number] Page 2 of 2 -------------------------------------------------------------------------------- Vitals Details Patient Name: Date of Service: MURANDA, KLAES 04/25/2023 10:00 A M Medical Record Number: [id number] Patient Account Number: [id number] Date of Birth/Sex: Treating RN: 1965-12-17 (57 y.o. [name] Primary Care Chue Berkovich: [name] Other Clinician: Haywood Pao Referring Karry Barrilleaux: Treating Tryson Lumley/Extender: Criselda Peaches in Treatment: 2 Vital Signs Time Taken: 09:49 Temperature (F): 97.2 Height (in): 64 Pulse (bpm): 83 Weight (lbs): 225 Respiratory Rate (breaths/min): 16 Body Mass Index (BMI): 38.6 Blood Pressure (mmHg): 100/68 Capillary Blood Glucose (mg/dl): 295 Reference Range: 80 - 120 mg / dl Electronic Signature(s) Signed: 04/25/2023 12:29:31 PM By: Haywood Pao CHT EMT BS , , Entered By: Haywood Pao on 04/25/2023 12:29:31

## 2023-04-25 NOTE — Progress Notes (Addendum)
[place], [name] ([id number]) [id number] Page 1 of 2 Visit Report for 04/25/2023 HBO Details Patient Name: Date of Service: [name], [name] 04/25/2023 10:00 A M Medical Record Number: [id number] Patient Account Number: [id number] Date of Birth/Sex: Treating RN: May 30, 1966 (57 y.o. [name] Primary Care Anaily Ashbaugh: [name] Other Clinician: Karl Bales Referring Nissim Fleischer: Treating Rayan Dyal/Extender: Criselda Peaches in Treatment: 2 HBO Treatment Course Details Treatment Course Number: 1 Ordering Abriel Geesey: Geralyn Corwin T Treatments Ordered: otal 40 HBO Treatment Start Date: 04/19/2023 HBO Indication: Diabetic Ulcer(s) of the Lower Extremity Standard/Conservative Wound Care tried and failed greater than or equal to 30 days Wound #17 Right, Plantar Foot HBO Treatment Details Treatment Number: 4 Patient Type: Outpatient Chamber Type: Monoplace Chamber Serial #: [id number] Treatment Protocol: 2.0 ATA with 90 minutes oxygen, with two 5 minute air breaks Treatment Details Compression Rate Down: 2.0 psi / minute De-Compression Rate Up: 2.0 psi / minute A breaks and breathing ir Compress Tx Pressure periods Decompress Decompress Begins Reached (leave unused spaces Begins Ends blank) Chamber Pressure (ATA 1 2 2 2 2 2  --2 1 ) Clock Time (24 hr) 10:36 10:50 11:20 11:25 11:55 12:00 - - 12:30 12:40 Treatment Length: 124 (minutes) Treatment Segments: 4 Vital Signs Capillary Blood Glucose Reference Range: 80 - 120 mg / dl HBO Diabetic Blood Glucose Intervention Range: <131 mg/dl or >536 mg/dl Time Vitals Blood Respiratory Capillary Blood Glucose Pulse Action Type: Pulse: Temperature: Taken: Pressure: Rate: Glucose (mg/dl): Meter #: Oximetry (%) Taken: Pre 09:49 100/68 83 16 97.2 201 Post 12:46 115/77 78 16 97.2 162 Treatment Response Treatment Toleration: Well Treatment Completion Status: Treatment Completed without Adverse  Event Additional Procedure Documentation Tissue Sevierity: Necrosis of bone Electronic Signature(s) Signed: 04/25/2023 4:29:10 PM By: Karl Bales EMT Signed: 04/27/2023 8:57:19 AM By: Allen Derry PA-C Entered By: Karl Bales on 04/25/2023 16:29:10 HBO Safety Checklist Details -------------------------------------------------------------------------------- Gregary Cromer ([id number]) [id number].pdf Page 2 of 2 Patient Name: Date of Service: [name], [name] 04/25/2023 10:00 A M Medical Record Number: [id number] Patient Account Number: [id number] Date of Birth/Sex: Treating RN: December 06, 1965 (57 y.o. [name] Primary Care Simrit Gohlke: [name] Other Clinician: Karl Bales Referring Leodan Bolyard: Treating Aleathia Purdy/Extender: Criselda Peaches in Treatment: 2 HBO Safety Checklist Items Safety Checklist Consent Form Signed Patient voided / foley secured and emptied When did you last eato 0830 Last dose of injectable or oral agent NA Ostomy pouch emptied and vented if applicable NA All implantable devices assessed, documented and approved NA Intravenous access site secured and place NA Valuables secured Linens and cotton and cotton/polyester blend (less than 51% polyester) Personal oil-based products / skin lotions / body lotions removed Wigs or hairpieces removed NA Smoking or tobacco materials removed Books / newspapers / magazines / loose paper removed Cologne, aftershave, perfume and deodorant removed Jewelry removed (may wrap wedding band) Make-up removed NA Hair care products removed Battery operated devices (external) removed Heating patches and chemical warmers removed Titanium eyewear removed removed Nail polish cured greater than 10 hours Over 10 hours old Casting material cured greater than 10 hours NA Hearing aids removed NA Loose dentures or partials removed NA Prosthetics have been removed NA Patient  demonstrates correct use of air break device (if applicable) Patient concerns have been addressed Patient grounding bracelet on and cord attached to chamber Specifics for Inpatients (complete in addition to above) Medication sheet sent with patient NA Intravenous medications needed or due during therapy sent with patient NA Drainage tubes (  e.g. nasogastric tube or chest tube secured and vented) NA Endotracheal or Tracheotomy tube secured NA Cuff deflated of air and inflated with saline NA Airway suctioned NA Electronic Signature(s) Signed: 04/25/2023 4:21:46 PM By: Karl Bales EMT Entered By: Karl Bales on 04/25/2023 16:21:46

## 2023-04-27 ENCOUNTER — Encounter (HOSPITAL_BASED_OUTPATIENT_CLINIC_OR_DEPARTMENT_OTHER): Payer: BC Managed Care – PPO | Admitting: Internal Medicine

## 2023-04-27 DIAGNOSIS — E11621 Type 2 diabetes mellitus with foot ulcer: Secondary | ICD-10-CM

## 2023-04-27 DIAGNOSIS — L97513 Non-pressure chronic ulcer of other part of right foot with necrosis of muscle: Secondary | ICD-10-CM | POA: Diagnosis not present

## 2023-04-27 LAB — GLUCOSE, CAPILLARY
Glucose-Capillary: 190 mg/dL — ABNORMAL HIGH (ref 70–99)
Glucose-Capillary: 174 mg/dL — ABNORMAL HIGH (ref 70–99)

## 2023-04-27 NOTE — Progress Notes (Addendum)
[name], [name] ([id number]) [id number] Page 1 of 2 Visit Report for 04/27/2023 Arrival Information Details Patient Name: Date of Service: [name], [name] 04/27/2023 10:00 A M Medical Record Number: [id number] Patient Account Number: [id number] Date of Birth/Sex: Treating RN: 1966/05/12 (57 y.o. [name], [name] Primary Care Bayleigh Loflin: [name] Other Clinician: Haywood Pao Referring Deland Slocumb: Treating Vara Mairena/Extender: [name] in Treatment: 2 Visit Information History Since Last Visit All ordered tests and consults were completed: Yes Patient Arrived: Wheel Chair Added or deleted any medications: No Arrival Time: 09:26 Any new allergies or adverse reactions: No Accompanied By: father Had a fall or experienced change in No Transfer Assistance: None activities of daily living that may affect Patient Identification Verified: Yes risk of falls: Secondary Verification Process Completed: Yes Signs or symptoms of abuse/neglect since last visito No Patient Requires Transmission-Based Precautions: No Hospitalized since last visit: No Patient Has Alerts: Yes Implantable device outside of the clinic excluding No Patient Alerts: Patient on Blood Thinner cellular tissue based products placed in the center Heparin since last visit: Pain Present Now: No Electronic Signature(s) Signed: 04/27/2023 2:06:33 PM By: Haywood Pao CHT EMT BS , , Previous Signature: 04/27/2023 11:58:11 AM Version By: Haywood Pao CHT EMT BS , , Entered By: Haywood Pao on 04/27/2023 14:06:33 -------------------------------------------------------------------------------- Encounter Discharge Information Details Patient Name: Date of Service: [name], [name] 04/27/2023 10:00 A M Medical Record Number: [id number] Patient Account Number: [id number] Date of Birth/Sex: Treating RN: [birth year] (57 y.o. [name], [name] Primary Care Teyah Rossy: [name] Other Clinician: Haywood Pao Referring Ardena Gangl: Treating Oscar Forman/Extender: [name] in Treatment: 2 Encounter Discharge Information Items Discharge Condition: Stable Ambulatory Status: Wheelchair Discharge Destination: Home Transportation: Private Auto Accompanied By: father Schedule Follow-up Appointment: No Clinical Summary of Care: Electronic Signature(s) Signed: 04/27/2023 1:56:10 PM By: Haywood Pao CHT EMT BS , , Entered By: Haywood Pao on 04/27/2023 13:56:10 [place], [name] ([id number]) [phone].pdf Page 2 of 2 -------------------------------------------------------------------------------- Vitals Details Patient Name: Date of Service: [name], [name] 04/27/2023 10:00 A M Medical Record Number: [id number] Patient Account Number: [id number] Date of Birth/Sex: Treating RN: May 30, 1966 (57 y.o. [name], [name] Primary Care Travarius Lange: [name] Other Clinician: Karl Bales Referring Chance Munter: Treating Marelin Tat/Extender: [name] in Treatment: 2 Vital Signs Time Taken: 09:40 Temperature (F): 97.9 Height (in): 64 Pulse (bpm): 84 Weight (lbs): 225 Respiratory Rate (breaths/min): 18 Body Mass Index (BMI): 38.6 Blood Pressure (mmHg): 110/77 Capillary Blood Glucose (mg/dl): 433 Reference Range: 80 - 120 mg / dl Electronic Signature(s) Signed: 04/27/2023 11:58:39 AM By: Haywood Pao CHT EMT BS , , Entered By: Haywood Pao on 04/27/2023 11:58:39

## 2023-04-27 NOTE — Progress Notes (Signed)
[place], [name] ([id number]) [id number] Page 1 of 2 Visit Report for 04/27/2023 HBO Details Patient Name: Date of Service: Stacey, [name] 04/27/2023 10:00 A M Medical Record Number: [id number] Patient Account Number: [id number] Date of Birth/Sex: Treating RN: [birth year] (57 y.o. [name], [name] Primary Care [name]: [name] Other Clinician: Haywood [name] Referring [name]: Treating [name]/Extender: [name] in Treatment: 2 HBO Treatment Course Details Treatment Course Number: 1 Ordering [name]: [name] T Treatments Ordered: otal 40 HBO Treatment Start Date: 04/19/2023 HBO Indication: Diabetic Ulcer(s) of the Lower Extremity Standard/Conservative Wound Care tried and failed greater than or equal to 30 days Wound #17 Right, Plantar Foot HBO Treatment Details Treatment Number: 5 Patient Type: Outpatient Chamber Type: Monoplace Chamber Serial #: [id number] Treatment Protocol: 2.0 ATA with 90 minutes oxygen, with two 5 minute air breaks Treatment Details Compression Rate Down: 1.0 psi / minute De-Compression Rate Up: 1.0 psi / minute A breaks and breathing ir Compress Tx Pressure periods Decompress Decompress Begins Reached (leave unused spaces Begins Ends blank) Chamber Pressure (ATA 1 2 2 2 2 2  --2 1 ) Clock Time (24 hr) 09:58 10:18 10:48 10:53 11:24 11:29 - - 11:58 12:13 Treatment Length: 135 (minutes) Treatment Segments: 4 Vital Signs Capillary Blood Glucose Reference Range: 80 - 120 mg / dl HBO Diabetic Blood Glucose Intervention Range: <131 mg/dl or >782 mg/dl Type: Time Vitals Blood Respiratory Capillary Blood Glucose Pulse Action Pulse: Temperature: Taken: Pressure: Rate: Glucose (mg/dl): Meter #: Oximetry (%) Taken: Pre 09:40 110/77 84 18 97.9 190 2 none per protocol Post 12:15 111/80 78 18 98.3 174 1 none per protocol Treatment Response Treatment Toleration: Well Treatment Completion  Status: Treatment Completed without Adverse Event Treatment Notes [name] arrived with normal vital signs. She prepared for treatment. After performing a safety check, she was placed in the chamber which was compressed with 100% oxygen at a rate of 1 psi/min with full ventilation causing the actual travel rate to be less; around 0.75 psi/min. She tolerated the treatment and the subsequent decompression at the rate of 1 psi/min. Patient reported slight pain in right ear. Informed Dr. Mikey [name], who examined patient's ears. Post treatment vital signs were within normal range. Patient was stable upon discharge. Additional Procedure Documentation Tissue Sevierity: Necrosis of muscle Physician HBO Attestation: I certify that I supervised this HBO treatment in accordance with Medicare guidelines. A trained emergency response team is readily available per Yes hospital policies and procedures. Continue HBOT as ordered. Yes Electronic Signature(s) Signed: 04/30/2023 12:25:26 PM By: Stacey Corwin DO Previous Signature: 04/27/2023 1:51:41 PM Version By: [name] CHT EMT BS , , [place], [name] ([id number]) [id number] Page 2 of 2 Previous Signature: 04/27/2023 1:51:41 PM Version By: [name] CHT EMT BS , , Previous Signature: 04/27/2023 1:50:38 PM Version By: [name] CHT EMT BS , , Entered By: [name] on 04/30/2023 12:22:21 -------------------------------------------------------------------------------- HBO Safety Checklist Details Patient Name: Date of Service: [name] 04/27/2023 10:00 A M Medical Record Number: [id number] Patient Account Number: [id number] Date of Birth/Sex: Treating RN: 1965-12-09 (57 y.o. [name] Primary Care [name]: [name] Other Clinician: Haywood [name] Referring [name]: Treating [name]: [name] in Treatment: 2 HBO Safety Checklist Items Safety  Checklist Consent Form Signed Patient voided / foley secured and emptied When did you last eato 0830 - 1/2 mcgriddle, hashbrown. Last dose of injectable or oral agent Last night Ostomy pouch emptied and vented if applicable NA All implantable devices assessed,  documented and approved Pain pump filled with saline Intravenous access site secured and place R Arm PICC Valuables secured Linens and cotton and cotton/polyester blend (less than 51% polyester) Personal oil-based products / skin lotions / body lotions removed Wigs or hairpieces removed NA Smoking or tobacco materials removed NA Books / newspapers / magazines / loose paper removed Cologne, aftershave, perfume and deodorant removed Jewelry removed (may wrap wedding band) Make-up removed Hair care products removed Battery operated devices (external) removed Heating patches and chemical warmers removed Titanium eyewear removed Nail polish cured greater than 10 hours greater than 10 hours Casting material cured greater than 10 hours NA Hearing aids removed NA Loose dentures or partials removed NA Prosthetics have been removed NA Patient demonstrates correct use of air break device (if applicable) Patient concerns have been addressed Patient grounding bracelet on and cord attached to chamber Specifics for Inpatients (complete in addition to above) Medication sheet sent with patient NA Intravenous medications needed or due during therapy sent with patient NA Drainage tubes (e.g. nasogastric tube or chest tube secured and vented) NA Endotracheal or Tracheotomy tube secured NA Cuff deflated of air and inflated with saline NA Airway suctioned NA Notes Paper version used prior to treatment start. Electronic Signature(s) Signed: 04/27/2023 1:51:29 PM By: [name] CHT EMT BS , , Previous Signature: 04/27/2023 12:01:20 PM Version By: [name] CHT EMT BS , , Entered By: [name] on 04/27/2023  13:51:28

## 2023-04-27 NOTE — Progress Notes (Signed)
KIPPI, ANUSZEWSKI ([id number]) [id number] Page 1 of 2 Visit Report for 04/25/2023 Problem List Details Patient Name: Date of Service: [name], [name] 04/25/2023 10:00 A M Medical Record Number: [id number] Patient Account Number: [id number] Date of Birth/Sex: Treating RN: [birth year] (57 y.o. 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) Z89.422 Acquired absence of other left  toe(s) Facility Procedures : CPT4 Code Description: [id number] G0277-(Facility Use Only) HBOT full body chamber, , ICD-10 Diagnosis Description E11.621 Type 2 diabetes mellitus with foot ulcer 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

## 2023-04-30 ENCOUNTER — Encounter (HOSPITAL_BASED_OUTPATIENT_CLINIC_OR_DEPARTMENT_OTHER): Payer: BC Managed Care – PPO | Admitting: Internal Medicine

## 2023-04-30 DIAGNOSIS — Z89422 Acquired absence of other left toe(s): Secondary | ICD-10-CM | POA: Diagnosis not present

## 2023-04-30 DIAGNOSIS — Z89421 Acquired absence of other right toe(s): Secondary | ICD-10-CM | POA: Diagnosis not present

## 2023-04-30 DIAGNOSIS — L97513 Non-pressure chronic ulcer of other part of right foot with necrosis of muscle: Secondary | ICD-10-CM

## 2023-04-30 DIAGNOSIS — E11621 Type 2 diabetes mellitus with foot ulcer: Secondary | ICD-10-CM

## 2023-04-30 LAB — GLUCOSE, CAPILLARY: Glucose-Capillary: 215 mg/dL — ABNORMAL HIGH (ref 70–99)

## 2023-04-30 NOTE — Progress Notes (Signed)
AURORE, CUTINO ([id number]) [id number] Page 1 of 1 Visit Report for 04/27/2023 SuperBill Details Patient Name: Date of Service: [name], [name] 04/27/2023 Medical Record Number: [id number] Patient Account Number: [id number] Date of Birth/Sex: Treating RN: [birth year] (57 y.o. 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.422 Acquired absence of other left toe(s) Facility Procedures CPT4 Code Description Modifier Quantity [id number] G0277-(Facility Use Only) HBOT full body chamber, , 4 ICD-10 Diagnosis Description E11.621 Type 2 diabetes mellitus with foot ulcer L97.513 Non-pressure chronic ulcer of other part of right foot with necrosis of muscle Physician Procedures Quantity CPT4 Code Description Modifier [id number] 99183 - WC PHYS HYPERBARIC OXYGEN THERAPY 1 ICD-10 Diagnosis Description E11.621 Type 2 diabetes mellitus with foot ulcer L97.513 Non-pressure chronic ulcer of other part of right foot with necrosis of muscle Electronic Signature(s) Signed: 04/27/2023 1:51:01 PM By: Haywood Pao CHT EMT BS , , Signed: 04/30/2023 12:25:26 PM By: Geralyn Corwin DO Entered By: Haywood Pao on 04/27/2023 13:51:01

## 2023-04-30 NOTE — Progress Notes (Signed)
[place], [name] ([id number]) [id number] Page 1 of 2 Visit Report for 04/30/2023 Arrival Information Details Patient Name: Date of Service: Stacey, [name] 04/30/2023 12:30 PM Medical Record Number: [id number] Patient Account Number: [id number] Date of Birth/Sex: Treating RN: [birth year] (57 y.o. [name] Primary Care [name]: [name] [name] Referring [name]: Treating [name] in Treatment: 3 Visit Information History Since Last Visit All ordered tests and consults were completed: Yes Patient Arrived: Wheel Chair Added or deleted any medications: No Arrival Time: 11:30 Any new allergies or adverse reactions: No Accompanied By: father Had a fall or experienced change in No Transfer Assistance: None activities of daily living that may affect Patient Identification Verified: Yes risk of falls: Secondary Verification Process Completed: Yes Signs or symptoms of abuse/neglect since last visito No Patient Requires Transmission-Based Precautions: No Hospitalized since last visit: No Patient Has Alerts: Yes Implantable device outside of the clinic excluding No Patient Alerts: Patient on Blood Thinner cellular tissue based products placed in the center Heparin since last visit: Pain Present Now: No Electronic Signature(s) Signed: 04/30/2023 3:04:37 PM By: Karl [name] EMT Entered By: Karl [name] on 04/30/2023 15:04:36 -------------------------------------------------------------------------------- Encounter Discharge Information Details Patient Name: Date of Service: [name], [name] 04/30/2023 12:30 PM Medical Record Number: [id number] Patient Account Number: [id number] Date of Birth/Sex: Treating RN: 05-12-1966 (56 y.o. [name] Primary Care [name]: [name] [name] Referring [name]: Treating [name] in Treatment: 3 Encounter Discharge Information Items Discharge Condition: Stable Ambulatory Status: Wheelchair Discharge Destination: Stacey (Note Required) Transportation: Private Auto Accompanied By: Father Schedule Follow-up Appointment: Yes Clinical Summary of Care: Notes The patient was seen in the [facility] after her treatment. Electronic Signature(s) Signed: 04/30/2023 3:10:06 PM By: Karl [name] EMT Entered By: Karl [name] on 04/30/2023 15:10:06 Ginley, [name] ([id number]) [id number] Page 2 of 2 -------------------------------------------------------------------------------- Vitals Details Patient Name: Date of Service: Stacey, [name] 04/30/2023 12:30 PM Medical Record Number: [id number] Patient Account Number: [id number] Date of Birth/Sex: Treating RN: [birth year] (57 y.o. [name] Primary Care [name]: [name] [name] Referring [name]: Treating [name] in Treatment: 3 Vital Signs Time Taken: 11:42 Temperature (F): 98.1 Height (in): 64 Pulse (bpm): 76 Weight (lbs): 225 Respiratory Rate (breaths/min): 18 Body Mass Index (BMI): 38.6 Blood Pressure (mmHg): 121/70 Capillary Blood Glucose (mg/dl): 433 Reference Range: 80 - 120 mg / dl Electronic Signature(s) Signed: 04/30/2023 3:05:05 PM By: Karl [name] EMT Entered By: Karl [name] on 04/30/2023 15:05:05

## 2023-04-30 NOTE — Progress Notes (Addendum)
[place], [name] ([id number]) [id number] Page 1 of 2 Visit Report for 04/30/2023 HBO Details Patient Name: Date of Service: [name], [name] 04/30/2023 12:30 PM Medical Record Number: [id number] Patient Account Number: [id number] Date of Birth/Sex: Treating RN: March 25, 1966 (57 y.o. Billy Coast, Linda Primary Care Pamelia Botto: [name] Other Clinician: Karl Bales Referring Laverda Stribling: Treating Cyanna Neace/Extender: [name] in Treatment: 3 HBO Treatment Course Details Treatment Course Number: 1 Ordering Faheem Ziemann: Geralyn Corwin T Treatments Ordered: otal 40 HBO Treatment Start Date: 04/19/2023 HBO Indication: Diabetic Ulcer(s) of the Lower Extremity Standard/Conservative Wound Care tried and failed greater than or equal to 30 days Wound #17 Right, Plantar Foot HBO Treatment Details Treatment Number: 6 Patient Type: Outpatient Chamber Type: Monoplace Chamber Serial #: [id number] Treatment Protocol: 2.0 ATA with 90 minutes oxygen, with two 5 minute air breaks Treatment Details Compression Rate Down: 1.0 psi / minute De-Compression Rate Up: 1.0 psi / minute A breaks and breathing ir Compress Tx Pressure periods Decompress Decompress Begins Reached (leave unused spaces Begins Ends blank) Chamber Pressure (ATA 1 2 2 2 2 2  --2 1 ) Clock Time (24 hr) 11:58 12:08 12:38 12:42 13:13 13:18 - - 13:49 13:59 Treatment Length: 121 (minutes) Treatment Segments: 4 Vital Signs Capillary Blood Glucose Reference Range: 80 - 120 mg / dl HBO Diabetic Blood Glucose Intervention Range: <131 mg/dl or >086 mg/dl Time Vitals Blood Respiratory Capillary Blood Glucose Pulse Action Type: Pulse: Temperature: Taken: Pressure: Rate: Glucose (mg/dl): Meter #: Oximetry (%) Taken: Pre 11:42 121/70 76 18 98.1 215 Post 14:04 129/83 77 18 97.8 223 Treatment Response Treatment Toleration: Well Treatment Completion Status: Treatment Completed without Adverse  Event Additional Procedure Documentation Tissue Sevierity: Necrosis of bone Physician HBO Attestation: I certify that I supervised this HBO treatment in accordance with Medicare guidelines. A trained emergency response team is readily available per Yes hospital policies and procedures. Continue HBOT as ordered. Yes Electronic Signature(s) Signed: 04/30/2023 4:36:37 PM By: Geralyn Corwin DO Previous Signature: 04/30/2023 3:08:38 PM Version By: Karl Bales EMT Entered By: Geralyn Corwin on 04/30/2023 16:35:47 [name], [name] ([id number]) [id number] Page 2 of 2 -------------------------------------------------------------------------------- HBO Safety Checklist Details Patient Name: Date of Service: [name], [name] 04/30/2023 12:30 PM Medical Record Number: [id number] Patient Account Number: [id number] Date of Birth/Sex: Treating RN: 12/26/1965 (57 y.o. Billy Coast, Linda Primary Care Alaysiah Browder: [name] Other Clinician: Karl Bales Referring Erilyn Pearman: Treating Genny Caulder/Extender: [name] in Treatment: 3 HBO Safety Checklist Items Safety Checklist Consent Form Signed Patient voided / foley secured and emptied When did you last eato 1030 Last dose of injectable or oral agent Last night Ostomy pouch emptied and vented if applicable NA All implantable devices assessed, documented and approved pain pump with saline approved Intravenous access site secured and place PICC Line Valuables secured Linens and cotton and cotton/polyester blend (less than 51% polyester) Personal oil-based products / skin lotions / body lotions removed Wigs or hairpieces removed Smoking or tobacco materials removed Books / newspapers / magazines / loose paper removed Cologne, aftershave, perfume and deodorant removed Jewelry removed (may wrap wedding band) Make-up removed Hair care products removed Battery operated devices (external) removed  FreeStyle Libre 14 Day Sensor Heating patches and chemical warmers removed Titanium eyewear removed Nail polish cured greater than 10 hours Over 10 hours old Haematologist cured greater than 10 hours NA Hearing aids removed NA Loose dentures or partials removed NA Prosthetics have been removed NA Patient demonstrates correct use of air break device (if applicable)  Patient concerns have been addressed Patient grounding bracelet on and cord attached to chamber Specifics for Inpatients (complete in addition to above) Medication sheet sent with patient NA Intravenous medications needed or due during therapy sent with patient NA Drainage tubes (e.g. nasogastric tube or chest tube secured and vented) NA Endotracheal or Tracheotomy tube secured NA Cuff deflated of air and inflated with saline NA Airway suctioned NA Notes The safety checklist was done before the treatment was started. Electronic Signature(s) Signed: 04/30/2023 3:07:20 PM By: Karl Bales EMT Entered By: Karl Bales on 04/30/2023 15:07:20

## 2023-04-30 NOTE — Progress Notes (Signed)
JAILY, FEATHERSTON ([id number]) [id number] Page 1 of 2 Visit Report for 04/30/2023 Problem List Details Patient Name: Date of Service: [name], [name] 04/30/2023 12:30 PM Medical Record Number: [id number] Patient Account Number: [id number] Date of Birth/Sex: Treating RN: [birth year] (57 y.o. 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) Z89.422 Acquired absence of other left  toe(s) Facility Procedures : CPT4 Code Description: [id number] G0277-(Facility Use Only) HBOT full body chamber, , ICD-10 Diagnosis Description E11.621 Type 2 diabetes mellitus with foot ulcer 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

## 2023-05-01 ENCOUNTER — Encounter (HOSPITAL_BASED_OUTPATIENT_CLINIC_OR_DEPARTMENT_OTHER): Payer: BC Managed Care – PPO | Admitting: Internal Medicine

## 2023-05-01 DIAGNOSIS — I2109 ST elevation (STEMI) myocardial infarction involving other coronary artery of anterior wall: Principal | ICD-10-CM | POA: Diagnosis present

## 2023-05-01 DIAGNOSIS — M62838 Other muscle spasm: Secondary | ICD-10-CM | POA: Diagnosis not present

## 2023-05-01 DIAGNOSIS — J9601 Acute respiratory failure with hypoxia: Secondary | ICD-10-CM | POA: Diagnosis not present

## 2023-05-01 DIAGNOSIS — I440 Atrioventricular block, first degree: Secondary | ICD-10-CM | POA: Diagnosis not present

## 2023-05-01 DIAGNOSIS — E11622 Type 2 diabetes mellitus with other skin ulcer: Secondary | ICD-10-CM | POA: Diagnosis present

## 2023-05-01 DIAGNOSIS — E1142 Type 2 diabetes mellitus with diabetic polyneuropathy: Secondary | ICD-10-CM | POA: Diagnosis present

## 2023-05-01 DIAGNOSIS — L97513 Non-pressure chronic ulcer of other part of right foot with necrosis of muscle: Secondary | ICD-10-CM | POA: Diagnosis not present

## 2023-05-01 DIAGNOSIS — E875 Hyperkalemia: Secondary | ICD-10-CM | POA: Diagnosis present

## 2023-05-01 DIAGNOSIS — I2510 Atherosclerotic heart disease of native coronary artery without angina pectoris: Secondary | ICD-10-CM | POA: Diagnosis present

## 2023-05-01 DIAGNOSIS — Z886 Allergy status to analgesic agent status: Secondary | ICD-10-CM

## 2023-05-01 DIAGNOSIS — E11621 Type 2 diabetes mellitus with foot ulcer: Secondary | ICD-10-CM

## 2023-05-01 DIAGNOSIS — K219 Gastro-esophageal reflux disease without esophagitis: Secondary | ICD-10-CM | POA: Diagnosis present

## 2023-05-01 DIAGNOSIS — Z90710 Acquired absence of both cervix and uterus: Secondary | ICD-10-CM

## 2023-05-01 DIAGNOSIS — J9602 Acute respiratory failure with hypercapnia: Secondary | ICD-10-CM | POA: Diagnosis not present

## 2023-05-01 DIAGNOSIS — Z79890 Hormone replacement therapy (postmenopausal): Secondary | ICD-10-CM

## 2023-05-01 DIAGNOSIS — E861 Hypovolemia: Secondary | ICD-10-CM | POA: Diagnosis not present

## 2023-05-01 DIAGNOSIS — Z95828 Presence of other vascular implants and grafts: Secondary | ICD-10-CM

## 2023-05-01 DIAGNOSIS — Z8249 Family history of ischemic heart disease and other diseases of the circulatory system: Secondary | ICD-10-CM

## 2023-05-01 DIAGNOSIS — E871 Hypo-osmolality and hyponatremia: Secondary | ICD-10-CM | POA: Diagnosis not present

## 2023-05-01 DIAGNOSIS — N170 Acute kidney failure with tubular necrosis: Secondary | ICD-10-CM | POA: Diagnosis present

## 2023-05-01 DIAGNOSIS — M3131 Wegener's granulomatosis with renal involvement: Secondary | ICD-10-CM | POA: Diagnosis present

## 2023-05-01 DIAGNOSIS — T17990A Other foreign object in respiratory tract, part unspecified in causing asphyxiation, initial encounter: Secondary | ICD-10-CM | POA: Diagnosis not present

## 2023-05-01 DIAGNOSIS — R571 Hypovolemic shock: Secondary | ICD-10-CM | POA: Diagnosis not present

## 2023-05-01 DIAGNOSIS — Y95 Nosocomial condition: Secondary | ICD-10-CM | POA: Diagnosis not present

## 2023-05-01 DIAGNOSIS — I5023 Acute on chronic systolic (congestive) heart failure: Secondary | ICD-10-CM | POA: Diagnosis present

## 2023-05-01 DIAGNOSIS — J9811 Atelectasis: Secondary | ICD-10-CM | POA: Diagnosis not present

## 2023-05-01 DIAGNOSIS — R1110 Vomiting, unspecified: Secondary | ICD-10-CM | POA: Diagnosis not present

## 2023-05-01 DIAGNOSIS — I232 Ventricular septal defect as current complication following acute myocardial infarction: Secondary | ICD-10-CM | POA: Diagnosis present

## 2023-05-01 DIAGNOSIS — Z1152 Encounter for screening for COVID-19: Secondary | ICD-10-CM

## 2023-05-01 DIAGNOSIS — I252 Old myocardial infarction: Secondary | ICD-10-CM

## 2023-05-01 DIAGNOSIS — R570 Cardiogenic shock: Secondary | ICD-10-CM | POA: Diagnosis present

## 2023-05-01 DIAGNOSIS — G4733 Obstructive sleep apnea (adult) (pediatric): Secondary | ICD-10-CM | POA: Diagnosis present

## 2023-05-01 DIAGNOSIS — E876 Hypokalemia: Secondary | ICD-10-CM | POA: Diagnosis not present

## 2023-05-01 DIAGNOSIS — Z7984 Long term (current) use of oral hypoglycemic drugs: Secondary | ICD-10-CM

## 2023-05-01 DIAGNOSIS — Z9049 Acquired absence of other specified parts of digestive tract: Secondary | ICD-10-CM

## 2023-05-01 DIAGNOSIS — I110 Hypertensive heart disease with heart failure: Secondary | ICD-10-CM | POA: Diagnosis present

## 2023-05-01 DIAGNOSIS — E785 Hyperlipidemia, unspecified: Secondary | ICD-10-CM | POA: Diagnosis present

## 2023-05-01 DIAGNOSIS — G90A Postural orthostatic tachycardia syndrome (POTS): Secondary | ICD-10-CM | POA: Diagnosis present

## 2023-05-01 DIAGNOSIS — G8929 Other chronic pain: Secondary | ICD-10-CM | POA: Diagnosis present

## 2023-05-01 DIAGNOSIS — R053 Chronic cough: Secondary | ICD-10-CM | POA: Diagnosis present

## 2023-05-01 DIAGNOSIS — R197 Diarrhea, unspecified: Secondary | ICD-10-CM | POA: Diagnosis not present

## 2023-05-01 DIAGNOSIS — E874 Mixed disorder of acid-base balance: Secondary | ICD-10-CM | POA: Diagnosis not present

## 2023-05-01 DIAGNOSIS — I241 Dressler's syndrome: Secondary | ICD-10-CM | POA: Diagnosis not present

## 2023-05-01 DIAGNOSIS — T8132XA Disruption of internal operation (surgical) wound, not elsewhere classified, initial encounter: Secondary | ICD-10-CM | POA: Diagnosis not present

## 2023-05-01 DIAGNOSIS — Z713 Dietary counseling and surveillance: Secondary | ICD-10-CM

## 2023-05-01 DIAGNOSIS — D62 Acute posthemorrhagic anemia: Secondary | ICD-10-CM | POA: Diagnosis not present

## 2023-05-01 DIAGNOSIS — Z6841 Body Mass Index (BMI) 40.0 and over, adult: Secondary | ICD-10-CM

## 2023-05-01 DIAGNOSIS — J440 Chronic obstructive pulmonary disease with acute lower respiratory infection: Secondary | ICD-10-CM | POA: Diagnosis not present

## 2023-05-01 DIAGNOSIS — D6959 Other secondary thrombocytopenia: Secondary | ICD-10-CM | POA: Diagnosis not present

## 2023-05-01 DIAGNOSIS — E1165 Type 2 diabetes mellitus with hyperglycemia: Secondary | ICD-10-CM | POA: Diagnosis present

## 2023-05-01 DIAGNOSIS — G909 Disorder of the autonomic nervous system, unspecified: Secondary | ICD-10-CM | POA: Diagnosis present

## 2023-05-01 DIAGNOSIS — D75839 Thrombocytosis, unspecified: Secondary | ICD-10-CM | POA: Diagnosis not present

## 2023-05-01 DIAGNOSIS — Z880 Allergy status to penicillin: Secondary | ICD-10-CM

## 2023-05-01 DIAGNOSIS — R011 Cardiac murmur, unspecified: Secondary | ICD-10-CM | POA: Diagnosis not present

## 2023-05-01 DIAGNOSIS — E1151 Type 2 diabetes mellitus with diabetic peripheral angiopathy without gangrene: Secondary | ICD-10-CM | POA: Diagnosis present

## 2023-05-01 DIAGNOSIS — M954 Acquired deformity of chest and rib: Secondary | ICD-10-CM | POA: Diagnosis not present

## 2023-05-01 DIAGNOSIS — Z833 Family history of diabetes mellitus: Secondary | ICD-10-CM

## 2023-05-01 DIAGNOSIS — I361 Nonrheumatic tricuspid (valve) insufficiency: Secondary | ICD-10-CM | POA: Diagnosis present

## 2023-05-01 DIAGNOSIS — M9689 Other intraoperative and postprocedural complications and disorders of the musculoskeletal system: Secondary | ICD-10-CM | POA: Diagnosis not present

## 2023-05-01 DIAGNOSIS — Z86711 Personal history of pulmonary embolism: Secondary | ICD-10-CM

## 2023-05-01 DIAGNOSIS — I480 Paroxysmal atrial fibrillation: Secondary | ICD-10-CM | POA: Diagnosis present

## 2023-05-01 DIAGNOSIS — Z86718 Personal history of other venous thrombosis and embolism: Secondary | ICD-10-CM

## 2023-05-01 DIAGNOSIS — L98492 Non-pressure chronic ulcer of skin of other sites with fat layer exposed: Secondary | ICD-10-CM | POA: Diagnosis present

## 2023-05-01 DIAGNOSIS — Z5986 Financial insecurity: Secondary | ICD-10-CM

## 2023-05-01 DIAGNOSIS — Z79899 Other long term (current) drug therapy: Secondary | ICD-10-CM

## 2023-05-01 DIAGNOSIS — R001 Bradycardia, unspecified: Secondary | ICD-10-CM | POA: Diagnosis not present

## 2023-05-01 DIAGNOSIS — E039 Hypothyroidism, unspecified: Secondary | ICD-10-CM | POA: Diagnosis present

## 2023-05-01 DIAGNOSIS — Z89422 Acquired absence of other left toe(s): Secondary | ICD-10-CM

## 2023-05-01 DIAGNOSIS — Z89421 Acquired absence of other right toe(s): Secondary | ICD-10-CM

## 2023-05-01 LAB — GLUCOSE, CAPILLARY
Glucose-Capillary: 223 mg/dL — ABNORMAL HIGH (ref 70–99)
Glucose-Capillary: 257 mg/dL — ABNORMAL HIGH (ref 70–99)
Glucose-Capillary: 181 mg/dL — ABNORMAL HIGH (ref 70–99)

## 2023-05-01 NOTE — Progress Notes (Signed)
[name] ([id number]) [id number] Page 1 of 9 Visit Report for 04/30/2023 Arrival Information Details Patient Name: Date of Service: Stacey, [name] 04/30/2023 3:15 PM Medical Record Number: [id number] Patient Account Number: [id number] Date of Birth/Sex: Treating RN: [birth year][name] (57 y.o. F) Primary Care [name]: [name] Other Clinician: Referring [name]: Treating [name]/Extender: [name] in Treatment: 3 Visit Information History Since Last Visit Added or deleted any medications: No Patient Arrived: Wheel Chair Any new allergies or adverse reactions: No Arrival Time: 14:22 Had a fall or experienced change in No Accompanied By: father activities of daily living that may affect Transfer Assistance: None risk of falls: Patient Requires Transmission-Based Precautions: No Signs or symptoms of abuse/neglect since last visito No Patient Has Alerts: Yes Hospitalized since last visit: No Patient Alerts: Patient on Blood Thinner Implantable device outside of the clinic excluding No Heparin cellular tissue based products placed in the center since last visit: Has Dressing in Place as Prescribed: Yes Pain Present Now: Yes Electronic Signature(s) Signed: 05/01/2023 4:04:59 PM By: [name] Entered By: [name] on 04/30/2023 14:27:29 -------------------------------------------------------------------------------- Clinic Level of Care Assessment Details Patient Name: Date of Service: Stacey, [name] 04/30/2023 3:15 PM Medical Record Number: [id number] Patient Account Number: [id number] Date of Birth/Sex: Treating RN: [name] ([name], [name] Primary Care Tanor Glaspy: [name] Other Clinician: Referring [name]: Treating [name]/Extender: [name] in Treatment: 3 Clinic Level of Care Assessment Items TOOL 4 Quantity Score X- 1 0 Use when only an EandM is performed on  FOLLOW-UP visit ASSESSMENTS - Nursing Assessment / Reassessment X- 1 10 Reassessment of Co-morbidities (includes updates in patient status) X- 1 5 Reassessment of Adherence to Treatment Plan ASSESSMENTS - Wound and Skin A ssessment / Reassessment []  - 0 Simple Wound Assessment / Reassessment - one wound X- 1 5 Complex Wound Assessment / Reassessment - multiple wounds []  - 0 Dermatologic / Skin Assessment (not related to wound area) ASSESSMENTS - Focused Assessment []  - 0 Circumferential Edema Measurements - multi extremities []  - 0 Nutritional Assessment / Counseling / Intervention [name], Stacey Stanley ([id number]) [id number] Page 2 of 9 []  - 0 Lower Extremity Assessment (monofilament, tuning fork, pulses) []  - 0 Peripheral Arterial Disease Assessment (using hand held doppler) ASSESSMENTS - Ostomy and/or Continence Assessment and Care []  - 0 Incontinence Assessment and Management []  - 0 Ostomy Care Assessment and Management (repouching, etc.) PROCESS - Coordination of Care []  - 0 Simple Patient / Family Education for ongoing care X- 1 20 Complex (extensive) Patient / Family Education for ongoing care X- 1 10 Staff obtains Chiropractor, Records, T Results / Process Orders est X- 1 10 Staff telephones HHA, Nursing Homes / Clarify orders / etc []  - 0 Routine Transfer to another Facility (non-emergent condition) []  - 0 Routine Hospital Admission (non-emergent condition) []  - 0 New Admissions / Manufacturing engineer / Ordering NPWT Apligraf, etc. , []  - 0 Emergency Hospital Admission (emergent condition) []  - 0 Simple Discharge Coordination X- 1 15 Complex (extensive) Discharge Coordination PROCESS - Special Needs []  - 0 Pediatric / Minor Patient Management []  - 0 Isolation Patient Management []  - 0 Hearing / Language / Visual special needs []  - 0 Assessment of Community assistance (transportation, D/C planning, etc.) []  - 0 Additional  assistance / Altered mentation []  - 0 Support Surface(s) Assessment (bed, cushion, seat, etc.) INTERVENTIONS - Wound Cleansing / Measurement []  - 0 Simple Wound Cleansing - one wound X- 1 5 Complex Wound Cleansing -  multiple wounds X- 1 5 Wound Imaging (photographs - any number of wounds) []  - 0 Wound Tracing (instead of photographs) []  - 0 Simple Wound Measurement - one wound X- 1 5 Complex Wound Measurement - multiple wounds INTERVENTIONS - Wound Dressings []  - 0 Small Wound Dressing one or multiple wounds X- 1 15 Medium Wound Dressing one or multiple wounds []  - 0 Large Wound Dressing one or multiple wounds []  - 0 Application of Medications - topical []  - 0 Application of Medications - injection INTERVENTIONS - Miscellaneous []  - 0 External ear exam []  - 0 Specimen Collection (cultures, biopsies, blood, body fluids, etc.) []  - 0 Specimen(s) / Culture(s) sent or taken to Lab for analysis []  - 0 Patient Transfer (multiple staff / Nurse, adult / Similar devices) []  - 0 Simple Staple / Suture removal (25 or less) []  - 0 Complex Staple / Suture removal (26 or more) []  - 0 Hypo / Hyperglycemic Management (close monitor of Blood Glucose) [name] ([id number]) [id number] Page 3 of 9 []  - 0 Ankle / Brachial Index (ABI) - do not check if billed separately X- 1 5 Vital Signs Has the patient been seen at the hospital within the last three years: Yes Total Score: 110 Level Of Care: New/Established - Level 3 Electronic Signature(s) Signed: 04/30/2023 5:30:48 PM By: [name] Entered By: Stacey Stall on 04/30/2023 15:31:06 -------------------------------------------------------------------------------- Encounter Discharge Information Details Patient Name: Date of Service: [name] 04/30/2023 3:15 PM Medical Record Number: [id number] Patient Account Number: [id number] Date of Birth/Sex: Treating RN: [birth year] (57 y.o. Debara [name],  [name] Primary Care [name]: [name] Other Clinician: Referring [name]: Treating [name]/Extender: [name] in Treatment: 3 Encounter Discharge Information Items Discharge Condition: Stable Ambulatory Status: Wheelchair Discharge Destination: Home Transportation: Private Auto Accompanied By: father Schedule Follow-up Appointment: Yes Clinical Summary of Care: Electronic Signature(s) Signed: 04/30/2023 5:30:48 PM By: [name] Entered By: Stacey Stall on 04/30/2023 15:31:41 -------------------------------------------------------------------------------- Lower Extremity Assessment Details Patient Name: Date of Service: Stacey, [name] 04/30/2023 3:15 PM Medical Record Number: [id number] Patient Account Number: [id number] Date of Birth/Sex: Treating RN: [birth year] [name] (57 y.o. F) Primary Care Zaryia Markel: [name] Other Clinician: Referring Shajuana Mclucas: Treating Rawad Bochicchio/Extender: [name] [name]: 3 Edema Assessment Assessed: [Left: No] [Right: No] Edema: [Left: Ye] [Right: s] Calf Left: Right: Point of Measurement: 37 cm From Medial Instep 41.5 cm 41 cm Ankle Left: Right: Point of Measurement: 11 cm From Medial Instep 23.5 cm 21.5 cm Electronic Signature(s) Signed: 05/01/2023 4:04:59 PM By: Lenon Curt, Eileen ([id number]) [id number] Page 4 of 9 Signed: 05/01/2023 4:04:59 PM By: [name] Entered By: [name] on 04/30/2023 14:48:53 -------------------------------------------------------------------------------- Multi Wound Chart Details Patient Name: Date of Service: Stacey, [name] 04/30/2023 3:15 PM Medical Record Number: [id number] Patient Account Number: [id number] Date of Birth/Sex: Treating RN: [name] (57 y.o. F) Primary Care Parke Jandreau: [name] Other Clinician: Referring Khyson Sebesta: Treating Shafin Pollio/Extender: [name] in Treatment: 3 Vital Signs Height(in): 64 Capillary Blood Glucose(mg/dl): 433 Weight(lbs): 295 Pulse(bpm): Body Mass Index(BMI): 38.6 Blood Pressure(mmHg): Temperature(F): Respiratory Rate(breaths/min): [17:Photos:] [N/A:N/A] Right, Plantar Foot N/A N/A Wound Location: Blister N/A N/A Wounding Event: Diabetic Wound/Ulcer of the Lower N/A N/A Primary Etiology: Extremity Anemia, Sleep Apnea, Hypotension, N/A N/A Comorbid History: Type II Diabetes, Osteoarthritis, Neuropathy 11/23/2022 N/A N/A Date Acquired: 3 N/A N/A Weeks of Treatment: Open N/A N/A Wound Status: No N/A N/A Wound Recurrence: 1.3x1x2 N/A N/A Measurements L  x W x D (cm) 1.021 N/A N/A A (cm) : rea 2.042 N/A N/A Volume (cm) : -225.20% N/A N/A % Reduction in A rea: -550.30% N/A N/A % Reduction in Volume: 2 Position 1 (o'clock): 2.1 Maximum Distance 1 (cm): Yes N/A N/A Tunneling: Grade 3 N/A N/A Classification: Medium N/A N/A Exudate A mount: Serosanguineous N/A N/A Exudate Type: red, brown N/A N/A Exudate Color: Distinct, outline attached N/A N/A Wound Margin: Large (67-100%) N/A N/A Granulation A mount: Pink, Pale, Hyper-granulation N/A N/A Granulation Quality: Small (1-33%) N/A N/A Necrotic A mount: Fat Layer (Subcutaneous Tissue): Yes N/A N/A Exposed Structures: Fascia: No Tendon: No Muscle: No Joint: No Bone: No None N/A N/A Epithelialization: Excoriation: No N/A N/A Periwound Skin Texture: Induration: No Callus: No Crepitus: No Rash: No Scarring: No Maceration: No N/A N/A Periwound Skin Moisture: Dry/Scaly: No Atrophie Blanche: No N/A N/A Periwound Skin Color: [place], Stacey Stanley ([id number]) [id number] Page 5 of 9 Cyanosis: No Ecchymosis: No Erythema: No Hemosiderin Staining: No Mottled: No Pallor: No Rubor: No Treatment Notes Wound #17 (Foot) Wound Laterality: Plantar, Right Cleanser Wound Cleanser Discharge Instruction:  Cleanse the wound with wound cleanser prior to applying a clean dressing using gauze sponges, not tissue or cotton balls. Peri-Wound Care Skin Prep Discharge Instruction: Use skin prep as directed Topical Primary Dressing Vashe wet to dry Discharge Instruction: Vashe moisten gauze lightly pack into wound bed. Secondary Dressing Optifoam Non-Adhesive Dressing, 4x4 in Discharge Instruction: Apply over primary dressing as a foam donut. Woven Gauze Sponges 2x2 in Discharge Instruction: Apply over primary dressing as directed. Secured With [employer], 4.5x3.1 (in/yd) Discharge Instruction: Secure with Kerlix as directed. 25M Medipore H Soft Cloth Surgical T ape, 4 x 10 (in/yd) Discharge Instruction: Secure with tape as directed. Fish net Discharge Instruction: apply over the kerlix. Compression Wrap Compression Stockings Add-Ons Electronic Signature(s) Signed: 04/30/2023 4:36:37 PM By: Geralyn Corwin DO Entered By: Geralyn Corwin on 04/30/2023 16:04:22 -------------------------------------------------------------------------------- Multi-Disciplinary Care Plan Details Patient Name: Date of Service: Stacey, [name] 04/30/2023 3:15 PM Medical Record Number: [id number] Patient Account Number: [id number] Date of Birth/Sex: Treating RN: [name] (57 y.o. Debara [name], [name] Primary Care Sha Burling: [name] Other Clinician: Referring Chasitee Zenker: Treating Myshawn Chiriboga/Extender: [name] in Treatment: 3 Active Inactive HBO Nursing Diagnoses: [name] ([id number]) [id number] Page 6 of 9 Anxiety related to feelings of confinement associated with the hyperbaric oxygen chamber Goals: Patient and/or family will be able to state/discuss factors appropriate to the management of their disease process during treatment Date Initiated: 04/09/2023 Target Resolution Date: 06/22/2023 Goal Status: Active Interventions: Assess and  provide for patients comfort related to the hyperbaric environment and equalization of middle ear Notes: Necrotic Tissue Nursing Diagnoses: Knowledge deficit related to management of necrotic/devitalized tissue Goals: Necrotic/devitalized tissue will be minimized in the wound bed Date Initiated: 04/09/2023 Target Resolution Date: 06/22/2023 Goal Status: Active Patient/caregiver will verbalize understanding of reason and process for debridement of necrotic tissue Date Initiated: 04/09/2023 Target Resolution Date: 05/25/2023 Goal Status: Active Interventions: Assess patient pain level pre-, during and post procedure and prior to discharge Provide education on necrotic tissue and debridement process Treatment Activities: Apply topical anesthetic as ordered : 04/09/2023 Notes: Wound/Skin Impairment Nursing Diagnoses: Knowledge deficit related to ulceration/compromised skin integrity Goals: Patient/caregiver will verbalize understanding of skin care regimen Date Initiated: 04/09/2023 Target Resolution Date: 06/22/2023 Goal Status: Active Interventions: Assess patient/caregiver ability to perform ulcer/skin care regimen upon admission and as needed Assess ulceration(s) every visit Provide education on ulcer and  skin care Screen for HBO Treatment Activities: Skin care regimen initiated : 04/09/2023 Topical wound management initiated : 04/09/2023 Notes: Electronic Signature(s) Signed: 04/30/2023 5:30:48 PM By: [name] Entered By: Stacey Stall on 04/30/2023 15:23:32 -------------------------------------------------------------------------------- Pain Assessment Details Patient Name: Date of Service: Stacey, [name] 04/30/2023 3:15 PM Medical Record Number: [id number] Patient Account Number: [id number] Date of Birth/Sex: Treating RN: [birth year][name] (57 y.o. F) Primary Care Noboru Bidinger: [name] Other Clinician: Referring Demetres Prochnow: Treating Reghan Thul/Extender: [name] in Treatment: 3 [place], Lalonnie ([id number]) [id number] Page 7 of 9 Active Problems Location of Pain Severity and Description of Pain Patient Has Paino Patient Unable to Respond Site Locations With Dressing Change: Yes Rate the pain. Current Pain Level: 5 Pain Management and Medication Current Pain Management: Electronic Signature(s) Signed: 05/01/2023 4:04:59 PM By: [name] Entered By: [name] on 04/30/2023 14:26:44 -------------------------------------------------------------------------------- Patient/Caregiver Education Details Patient Name: Date of Service: [name] 7/8/2024andnbsp3:15 PM Medical Record Number: [id number] Patient Account Number: [id number] Date of Birth/Gender: Treating RN: [birth year] [name] (57 y.o. [name] Primary Care Physician: [name] Other Clinician: Referring Physician: Treating Physician/Extender: [name] in Treatment: 3 Education Assessment Education Provided To: Patient Education Topics Provided Wound/Skin Impairment: Handouts: Caring for Your Ulcer Methods: Explain/Verbal Responses: Reinforcements needed Electronic Signature(s) Signed: 04/30/2023 5:30:48 PM By: [name] Entered By: Stacey Stall on 04/30/2023 15:23:47 Raymundo, Stacey Stanley ([id number]) [id number] Page 8 of 9 -------------------------------------------------------------------------------- Wound Assessment Details Patient Name: Date of Service: Stacey, [name] 04/30/2023 3:15 PM Medical Record Number: [id number] Patient Account Number: [id number] Date of Birth/Sex: Treating RN: [birth year] (57 y.o. F) Primary Care Graziella Connery: [name] Other Clinician: Referring Jawon Dipiero: Treating Byard Carranza/Extender: [name] in Treatment: 3 Wound Status Wound Number: 17 Primary Diabetic Wound/Ulcer of the Lower  Extremity Etiology: Wound Location: Right, Plantar Foot Wound Open Wounding Event: Blister Status: Date Acquired: 11/23/2022 Comorbid Anemia, Sleep Apnea, Hypotension, Type II Diabetes, Weeks Of Treatment: 3 History: Osteoarthritis, Neuropathy Clustered Wound: No Photos Wound Measurements Length: (cm) 1.3 Width: (cm) 1 Depth: (cm) 2 Area: (cm) 1.021 Volume: (cm) 2.042 % Reduction in Area: -225.2% % Reduction in Volume: -550.3% Epithelialization: None Tunneling: Yes Position (o'clock): 2 Maximum Distance: (cm) 2.1 Undermining: No Wound Description Classification: Grade 3 Wound Margin: Distinct, outline attached Exudate Amount: Medium Exudate Type: Serosanguineous Exudate Color: red, brown Foul Odor After Cleansing: No Slough/Fibrino Yes Wound Bed Granulation Amount: Large (67-100%) Exposed Structure Granulation Quality: Pink, Pale, Hyper-granulation Fascia Exposed: No Necrotic Amount: Small (1-33%) Fat Layer (Subcutaneous Tissue) Exposed: Yes Necrotic Quality: Adherent Slough Tendon Exposed: No Muscle Exposed: No Joint Exposed: No Bone Exposed: No Periwound Skin Texture Texture Color No Abnormalities Noted: No No Abnormalities Noted: No Callus: No Atrophie Blanche: No Crepitus: No Cyanosis: No Excoriation: No Ecchymosis: No Induration: No Erythema: No Rash: No Hemosiderin Staining: No Scarring: No Mottled: No Pallor: No Moisture Rubor: No No Abnormalities Noted: No Dry / Scaly: No Maceration: No Treatment Notes Brubacher, Gwenna ([id number]) [id number] Page 9 of 9 Wound #17 (Foot) Wound Laterality: Plantar, Right Cleanser Wound Cleanser Discharge Instruction: Cleanse the wound with wound cleanser prior to applying a clean dressing using gauze sponges, not tissue or cotton balls. Peri-Wound Care Skin Prep Discharge Instruction: Use skin prep as directed Topical Primary Dressing Vashe wet to dry Discharge Instruction: Vashe  moisten gauze lightly pack into wound bed. Secondary Dressing Optifoam Non-Adhesive Dressing, 4x4 in Discharge Instruction: Apply over primary dressing as a foam donut. Woven  Gauze Sponges 2x2 in Discharge Instruction: Apply over primary dressing as directed. Secured With [employer], 4.5x3.1 (in/yd) Discharge Instruction: Secure with Kerlix as directed. 5M Medipore H Soft Cloth Surgical T ape, 4 x 10 (in/yd) Discharge Instruction: Secure with tape as directed. Fish net Discharge Instruction: apply over the kerlix. Compression Wrap Compression Stockings Add-Ons Electronic Signature(s) Signed: 04/30/2023 5:30:48 PM By: [name] Entered By: Stacey Stall on 04/30/2023 15:26:15 -------------------------------------------------------------------------------- Vitals Details Patient Name: Date of Service: [name], [name] 04/30/2023 3:15 PM Medical Record Number: [id number] Patient Account Number: [id number] Date of Birth/Sex: Treating RN: [birth year][name] (57 y.o. F) Primary Care Jacarri Gesner: [name] Other Clinician: Referring Caresse Sedivy: Treating Santiana Glidden/Extender: [name] in Treatment: 3 Vital Signs Time Taken: 14:26 Capillary Blood Glucose (mg/dl): 102 Height (in): 64 Reference Range: 80 - 120 mg / dl Weight (lbs): 725 Body Mass Index (BMI): 38.6 Electronic Signature(s) Signed: 05/01/2023 4:04:59 PM By: [name] Entered By: [name] on 04/30/2023 14:26:33

## 2023-05-01 NOTE — Progress Notes (Addendum)
[place], [name] ([id number]) [id number] Page 1 of 2 Visit Report for 05/01/2023 HBO Details Patient Name: Date of Service: [name], [name] 05/01/2023 11:30 A M Medical Record Number: [id number] Patient Account Number: [id number] Date of Birth/Sex: Treating RN: [birth year] (57 y.o. [name], [name] Primary Care Jereld Presti: [name] Other Clinician: Karl Bales Referring Alexei Doswell: Treating Iasia Forcier/Extender: [name] in Treatment: 3 HBO Treatment Course Details Treatment Course Number: 1 Ordering Kayley Zeiders: Geralyn Corwin T Treatments Ordered: otal 40 HBO Treatment Start Date: 04/19/2023 HBO Indication: Diabetic Ulcer(s) of the Lower Extremity Standard/Conservative Wound Care tried and failed greater than or equal to 30 days Wound #17 Right, Plantar Foot HBO Treatment Details Treatment Number: 7 Patient Type: Outpatient Chamber Type: Monoplace Chamber Serial #: [id number] Treatment Protocol: 2.0 ATA with 90 minutes oxygen, with two 5 minute air breaks Treatment Details Compression Rate Down: 1.5 psi / minute De-Compression Rate Up: A breaks and breathing ir Compress Tx Pressure periods Decompress Decompress Begins Reached (leave unused spaces Begins Ends blank) Chamber Pressure (ATA 1 2 2 2 2 2  --2 1 ) Clock Time (24 hr) 11:36 11:50 12:20 12:25 12:55 13:00 - - 13:30 13:40 Treatment Length: 124 (minutes) Treatment Segments: 4 Vital Signs Capillary Blood Glucose Reference Range: 80 - 120 mg / dl HBO Diabetic Blood Glucose Intervention Range: <131 mg/dl or >782 mg/dl Time Vitals Blood Respiratory Capillary Blood Glucose Pulse Action Type: Pulse: Temperature: Taken: Pressure: Rate: Glucose (mg/dl): Meter #: Oximetry (%) Taken: Pre 11:15 106/71 81 18 97.2 257 Post 13:45 123/77 73 18 97.1 181 Treatment Response Treatment Toleration: Well Treatment Completion Status: Treatment Completed without Adverse Event Additional  Procedure Documentation Tissue Sevierity: Necrosis of muscle Physician HBO Attestation: I certify that I supervised this HBO treatment in accordance with Medicare guidelines. A trained emergency response team is readily available per Yes hospital policies and procedures. Continue HBOT as ordered. Yes Electronic Signature(s) Signed: 05/03/2023 11:31:25 AM By: Geralyn Corwin DO Previous Signature: 05/01/2023 2:20:09 PM Version By: Karl Bales EMT Previous Signature: 05/01/2023 2:56:31 PM Version By: Geralyn Corwin DO Entered By: Geralyn Corwin on 05/03/2023 09:52:09 Linder, [name] ([id number]) [id number] Page 2 of 2 -------------------------------------------------------------------------------- HBO Safety Checklist Details Patient Name: Date of Service: LAKEYSHIA, TUCKERMAN 05/01/2023 11:30 A M Medical Record Number: [id number] Patient Account Number: [id number] Date of Birth/Sex: Treating RN: [birth year] (58 y.o. [name], [name] Primary Care Delores Edelstein: [name] Other Clinician: Karl Bales Referring Tasmia Blumer: Treating Namiyah Grantham/Extender: [name] in Treatment: 3 HBO Safety Checklist Items Safety Checklist Consent Form Signed Patient voided / foley secured and emptied When did you last eato 1030 Last dose of injectable or oral agent Last night Ostomy pouch emptied and vented if applicable NA All implantable devices assessed, documented and approved pain pump with saline approved Intravenous access site secured and place PICC Line Valuables secured Linens and cotton and cotton/polyester blend (less than 51% polyester) Personal oil-based products / skin lotions / body lotions removed Wigs or hairpieces removed Smoking or tobacco materials removed Books / newspapers / magazines / loose paper removed Cologne, aftershave, perfume and deodorant removed Jewelry removed (may wrap wedding band) Make-up removed Hair care products  removed Battery operated devices (external) removed Heating patches and chemical warmers removed Titanium eyewear removed NA Nail polish cured greater than 10 hours Over 10 hours old Casting material cured greater than 10 hours NA Hearing aids removed NA Loose dentures or partials removed NA Prosthetics have been removed NA Patient demonstrates correct use of air  break device (if applicable) Patient concerns have been addressed Patient grounding bracelet on and cord attached to chamber Specifics for Inpatients (complete in addition to above) Medication sheet sent with patient NA Intravenous medications needed or due during therapy sent with patient NA Drainage tubes (e.g. nasogastric tube or chest tube secured and vented) NA Endotracheal or Tracheotomy tube secured NA Cuff deflated of air and inflated with saline NA Airway suctioned NA Notes The safety checklist was done before the treatment was started. Electronic Signature(s) Signed: 05/01/2023 2:18:24 PM By: Karl Bales EMT Entered By: Karl Bales on 05/01/2023 14:18:23

## 2023-05-01 NOTE — Progress Notes (Signed)
DALAL, LIVENGOOD ([id number]) [id number] Page 1 of 2 Visit Report for 05/01/2023 Problem List Details Patient Name: Date of Service: [name], [name] 05/01/2023 11:30 A M Medical Record Number: [id number] Patient Account Number: [id number] Date of Birth/Sex: Treating RN: 08/22/1966 (57 y.o. 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) Z89.422 Acquired absence of other left  toe(s) Facility Procedures : CPT4 Code Description: [id number]-(Facility Use Only) HBOT full body chamber, , ICD-10 Diagnosis Description E11.621 Type 2 diabetes mellitus with foot ulcer 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

## 2023-05-01 NOTE — Progress Notes (Signed)
Gregary Cromer ([id number]) [id number] Page 1 of 2 Visit Report for 05/01/2023 Arrival Information Details Patient Name: Date of Service: WEDA, LEVINE 05/01/2023 11:30 A M Medical Record Number: [id number] Patient Account Number: [id number] Date of Birth/Sex: Treating RN: 24-Feb-1966 (57 y.o. [name], [name] Primary Care Malkia Nippert: [name] Other Clinician: Karl Bales Referring Kayra Crowell: Treating Kayde Warehime/Extender: [name] in Treatment: 3 Visit Information History Since Last Visit All ordered tests and consults were completed: Yes Patient Arrived: Wheel Chair Added or deleted any medications: No Arrival Time: 11:02 Any new allergies or adverse reactions: No Accompanied By: father Had a fall or experienced change in No Transfer Assistance: None activities of daily living that may affect Patient Identification Verified: Yes risk of falls: Secondary Verification Process Completed: Yes Signs or symptoms of abuse/neglect since last visito No Patient Requires Transmission-Based Precautions: No Hospitalized since last visit: No Patient Has Alerts: Yes Implantable device outside of the clinic excluding No Patient Alerts: Patient on Blood Thinner cellular tissue based products placed in the center Heparin since last visit: Pain Present Now: No Electronic Signature(s) Signed: 05/01/2023 2:14:51 PM By: Karl Bales EMT Entered By: Karl Bales on 05/01/2023 14:14:51 -------------------------------------------------------------------------------- Encounter Discharge Information Details Patient Name: Date of Service: [name], Shakela 05/01/2023 11:30 A M Medical Record Number: [id number] Patient Account Number: [id number] Date of Birth/Sex: Treating RN: [birth year] (57 y.o. [name], [name] Primary Care Tomasita Beevers: [name] Other Clinician: Karl Bales Referring Nam Vossler: Treating Luticia Tadros/Extender: [name] in Treatment: 3 Encounter Discharge Information Items Discharge Condition: Stable Ambulatory Status: Wheelchair Discharge Destination: Home Transportation: Private Auto Accompanied By: Father Schedule Follow-up Appointment: Yes Clinical Summary of Care: Electronic Signature(s) Signed: 05/01/2023 2:21:34 PM By: Karl Bales EMT Entered By: Karl Bales on 05/01/2023 14:21:34 Chestnut, [name] ([id number]) [id number] Page 2 of 2 -------------------------------------------------------------------------------- Vitals Details Patient Name: Date of Service: ALIONNA, CLINGERMAN 05/01/2023 11:30 A M Medical Record Number: [id number] Patient Account Number: [id number] Date of Birth/Sex: Treating RN: 16-May-1966 (57 y.o. [name], [name] Primary Care Cosby Proby: [name] Other Clinician: Karl Bales Referring Lydiann Bonifas: Treating Iyonnah Ferrante/Extender: [name] in Treatment: 3 Vital Signs Time Taken: 11:15 Temperature (F): 97.2 Height (in): 64 Pulse (bpm): 81 Weight (lbs): 225 Respiratory Rate (breaths/min): 18 Body Mass Index (BMI): 38.6 Blood Pressure (mmHg): 106/71 Capillary Blood Glucose (mg/dl): 732 Reference Range: 80 - 120 mg / dl Electronic Signature(s) Signed: 05/01/2023 2:15:22 PM By: Karl Bales EMT Entered By: Karl Bales on 05/01/2023 14:15:22

## 2023-05-02 ENCOUNTER — Encounter (HOSPITAL_BASED_OUTPATIENT_CLINIC_OR_DEPARTMENT_OTHER): Payer: BC Managed Care – PPO | Admitting: Physician Assistant

## 2023-05-02 DIAGNOSIS — E11621 Type 2 diabetes mellitus with foot ulcer: Secondary | ICD-10-CM | POA: Diagnosis not present

## 2023-05-02 LAB — GLUCOSE, CAPILLARY
Glucose-Capillary: 262 mg/dL — ABNORMAL HIGH (ref 70–99)
Glucose-Capillary: 214 mg/dL — ABNORMAL HIGH (ref 70–99)

## 2023-05-02 NOTE — Progress Notes (Addendum)
[place], [name] ([id number]) [id number] Page 1 of 2 Visit Report for 05/02/2023 HBO Details Patient Name: Date of Service: [name], [name] 05/02/2023 11:30 A M Medical Record Number: [id number] Patient Account Number: [id number] Date of Birth/Sex: Treating RN: November 26, 1965 (57 y.o. [name] Primary Care Hibo Blasdell: [name] Other Clinician: Karl Bales Referring Wilene Pharo: Treating Katia Hannen/Extender: Criselda Peaches in Treatment: 3 HBO Treatment Course Details Treatment Course Number: 1 Ordering Exodus Kutzer: Geralyn Corwin T Treatments Ordered: otal 40 HBO Treatment Start Date: 04/19/2023 HBO Indication: Diabetic Ulcer(s) of the Lower Extremity Standard/Conservative Wound Care tried and failed greater than or equal to 30 days Wound #17 Right, Plantar Foot HBO Treatment Details Treatment Number: 8 Patient Type: Outpatient Chamber Type: Monoplace Chamber Serial #: [id number] Treatment Protocol: 2.0 ATA with 90 minutes oxygen, with two 5 minute air breaks Treatment Details Compression Rate Down: 1.5 psi / minute De-Compression Rate Up: 2.0 psi / minute A breaks and breathing ir Compress Tx Pressure periods Decompress Decompress Begins Reached (leave unused spaces Begins Ends blank) Chamber Pressure (ATA 1 2 2 2 2 2  --2 1 ) Clock Time (24 hr) 11:20 11:31 12:01 12:06 12:36 12:41 - - 13:11 13:19 Treatment Length: 119 (minutes) Treatment Segments: 4 Vital Signs Capillary Blood Glucose Reference Range: 80 - 120 mg / dl HBO Diabetic Blood Glucose Intervention Range: <131 mg/dl or >782 mg/dl Time Vitals Blood Respiratory Capillary Blood Glucose Pulse Action Type: Pulse: Temperature: Taken: Pressure: Rate: Glucose (mg/dl): Meter #: Oximetry (%) Taken: Pre 10:52 119/81 91 18 97.2 262 Post 13:26 123/75 71 20 97.2 214 Treatment Response Treatment Toleration: Well Treatment Completion Status: Treatment Completed without  Adverse Event Additional Procedure Documentation Tissue Sevierity: Necrosis of muscle Electronic Signature(s) Signed: 05/02/2023 4:13:00 PM By: Karl Bales EMT Signed: 05/02/2023 5:48:19 PM By: Allen Derry PA-C Entered By: Karl Bales on 05/02/2023 16:13:00 HBO Safety Checklist Details -------------------------------------------------------------------------------- Gregary Cromer ([id number]) [id number] Page 2 of 2 Patient Name: Date of Service: [name], [name] 05/02/2023 11:30 A M Medical Record Number: [id number] Patient Account Number: [id number] Date of Birth/Sex: Treating RN: August 01, 1966 (57 y.o. [name] Primary Care Alyxandria Wentz: [name] Other Clinician: Karl Bales Referring Delanna Blacketer: Treating Adger Cantera/Extender: Criselda Peaches in Treatment: 3 HBO Safety Checklist Items Safety Checklist Consent Form Signed Patient voided / foley secured and emptied When did you last eato 0900 Last dose of injectable or oral agent Last night Ostomy pouch emptied and vented if applicable NA All implantable devices assessed, documented and approved pain pump with saline approved Intravenous access site secured and place PICC Line Valuables secured Linens and cotton and cotton/polyester blend (less than 51% polyester) Personal oil-based products / skin lotions / body lotions removed Wigs or hairpieces removed NA Smoking or tobacco materials removed Books / newspapers / magazines / loose paper removed Cologne, aftershave, perfume and deodorant removed Jewelry removed (may wrap wedding band) Make-up removed Hair care products removed Battery operated devices (external) removed Heating patches and chemical warmers removed Titanium eyewear removed NA Nail polish cured greater than 10 hours NA Casting material cured greater than 10 hours NA Hearing aids removed NA Loose dentures or partials removed NA Prosthetics have  been removed NA Patient demonstrates correct use of air break device (if applicable) Patient concerns have been addressed Patient grounding bracelet on and cord attached to chamber Specifics for Inpatients (complete in addition to above) Medication sheet sent with patient NA Intravenous medications needed or due during therapy sent with patient NA  Drainage tubes (e.g. nasogastric tube or chest tube secured and vented) NA Endotracheal or Tracheotomy tube secured NA Cuff deflated of air and inflated with saline NA Airway suctioned NA Notes The safety checklist was done before the treatment was started. Electronic Signature(s) Signed: 05/02/2023 4:11:40 PM By: Karl Bales EMT Entered By: Karl Bales on 05/02/2023 16:11:40

## 2023-05-02 NOTE — Progress Notes (Signed)
Gregary Cromer ([id number]) [id number] Page 1 of 2 Visit Report for 05/02/2023 Arrival Information Details Patient Name: Date of Service: [name], [name] 05/02/2023 11:30 A M Medical Record Number: [id number] Patient Account Number: [id number] Date of Birth/Sex: Treating RN: November 08, 1965 (56 y.o. [name] Primary Care Arvie Bartholomew: [name] Other Clinician: Karl Bales Referring Dartanian Knaggs: Treating Jacaria Colburn/Extender: Criselda Peaches in Treatment: 3 Visit Information History Since Last Visit All ordered tests and consults were completed: Yes Patient Arrived: Wheel Chair Added or deleted any medications: No Arrival Time: 10:40 Any new allergies or adverse reactions: No Accompanied By: father Had a fall or experienced change in No Transfer Assistance: None activities of daily living that may affect Patient Identification Verified: Yes risk of falls: Secondary Verification Process Completed: Yes Signs or symptoms of abuse/neglect since last visito No Patient Requires Transmission-Based Precautions: No Hospitalized since last visit: No Patient Has Alerts: Yes Implantable device outside of the clinic excluding No Patient Alerts: Patient on Blood Thinner cellular tissue based products placed in the center Heparin since last visit: Pain Present Now: No Electronic Signature(s) Signed: 05/02/2023 4:09:44 PM By: Karl Bales EMT Entered By: Karl Bales on 05/02/2023 16:09:44 -------------------------------------------------------------------------------- Encounter Discharge Information Details Patient Name: Date of Service: [name], Celie 05/02/2023 11:30 A M Medical Record Number: [id number] Patient Account Number: [id number] Date of Birth/Sex: Treating RN: July 31, 1966 (57 y.o. [name] Primary Care Alvie Fowles: [name] Other Clinician: Karl Bales Referring Annaliyah Willig: Treating Charlottie Peragine/Extender: Criselda Peaches in Treatment: 3 Encounter Discharge Information Items Discharge Condition: Stable Ambulatory Status: Wheelchair Discharge Destination: Home Transportation: Private Auto Accompanied By: Father Schedule Follow-up Appointment: Yes Clinical Summary of Care: Electronic Signature(s) Signed: 05/02/2023 4:13:55 PM By: Karl Bales EMT Entered By: Karl Bales on 05/02/2023 16:13:55 [place], [name] ([id number]) [id number] Page 2 of 2 -------------------------------------------------------------------------------- Vitals Details Patient Name: Date of Service: JAMONI, BROADFOOT 05/02/2023 11:30 A M Medical Record Number: [id number] Patient Account Number: [id number] Date of Birth/Sex: Treating RN: 1965/11/29 (57 y.o. [name] Primary Care Alise Calais: [name] Other Clinician: Karl Bales Referring Kym Fenter: Treating Imran Nuon/Extender: Criselda Peaches in Treatment: 3 Vital Signs Time Taken: 10:52 Temperature (F): 97.2 Height (in): 64 Pulse (bpm): 91 Weight (lbs): 225 Respiratory Rate (breaths/min): 18 Body Mass Index (BMI): 38.6 Blood Pressure (mmHg): 119/81 Capillary Blood Glucose (mg/dl): 841 Reference Range: 80 - 120 mg / dl Electronic Signature(s) Signed: 05/02/2023 4:10:15 PM By: Karl Bales EMT Entered By: Karl Bales on 05/02/2023 16:10:15

## 2023-05-03 ENCOUNTER — Encounter (HOSPITAL_BASED_OUTPATIENT_CLINIC_OR_DEPARTMENT_OTHER): Payer: BC Managed Care – PPO | Admitting: Internal Medicine

## 2023-05-03 DIAGNOSIS — L97513 Non-pressure chronic ulcer of other part of right foot with necrosis of muscle: Secondary | ICD-10-CM

## 2023-05-03 DIAGNOSIS — E11621 Type 2 diabetes mellitus with foot ulcer: Secondary | ICD-10-CM | POA: Diagnosis not present

## 2023-05-03 LAB — GLUCOSE, CAPILLARY
Glucose-Capillary: 205 mg/dL — ABNORMAL HIGH (ref 70–99)
Glucose-Capillary: 208 mg/dL — ABNORMAL HIGH (ref 70–99)

## 2023-05-03 NOTE — Progress Notes (Addendum)
[place], [name] ([id number]) [id number] Page 1 of 3 Visit Report for 05/03/2023 Arrival Information Details Patient Name: Date of Service: [name], [name] 05/03/2023 10:00 A M Medical Record Number: [id number] Patient Account Number: [id number] Date of Birth/Sex: Treating RN: 10-26-1965 (57 y.o. [name] Primary Care : [name] Other Clinician: Haywood Pao Referring : Treating /Extender: [name] in Treatment: 3 Visit Information History Since Last Visit All ordered tests and consults were completed: Yes Patient Arrived: Wheel Chair Added or deleted any medications: No Arrival Time: 09:43 Any new allergies or adverse reactions: No Accompanied By: father Had a fall or experienced change in No Transfer Assistance: None activities of daily living that may affect Patient Identification Verified: Yes risk of falls: Secondary Verification Process Completed: Yes Signs or symptoms of abuse/neglect since last visito No Patient Requires Transmission-Based Precautions: No Hospitalized since last visit: No Patient Has Alerts: Yes Implantable device outside of the clinic excluding No Patient Alerts: Patient on Blood Thinner cellular tissue based products placed in the center Heparin since last visit: Pain Present Now: No Electronic Signature(s) Signed: 05/03/2023 1:20:11 PM By: Haywood Pao CHT EMT BS , , Entered By: Haywood Pao on 05/03/2023 13:20:11 -------------------------------------------------------------------------------- Encounter Discharge Information Details Patient Name: Date of Service: [name], [name] 05/03/2023 10:00 A M Medical Record Number: [id number] Patient Account Number: [id number] Date of Birth/Sex: Treating RN: 27-Aug-1966 (57 y.o. [name] Primary Care : [name] Other Clinician: Karl Bales Referring : Treating  /Extender: [name] in Treatment: 3 Encounter Discharge Information Items Discharge Condition: Stable Ambulatory Status: Wheelchair Discharge Destination: Home Transportation: Private Auto Accompanied By: Dad Schedule Follow-up Appointment: Yes Clinical Summary of Care: Electronic Signature(s) Signed: 05/03/2023 2:00:46 PM By: Karl Bales EMT Entered By: Karl Bales on 05/03/2023 14:00:46 [name], [name] ([id number]) [id number] Page 2 of 3 -------------------------------------------------------------------------------- General Visit Notes Details Patient Name: Date of Service: [name], [name] 05/03/2023 10:00 A M Medical Record Number: [id number] Patient Account Number: [id number] Date of Birth/Sex: Treating RN: 1966/08/06 (56 y.o. [name] Primary Care : [name] Other Clinician: Karl Bales Referring : Treating /Extender: [name] in Treatment: 3 Notes Patient had her PICC line removed yesterday 05/02/23 after her HBO treatment. PICC line medications discontinued. Electronic Signature(s) Signed: 05/03/2023 3:43:07 PM By: Gelene Mink By: Samuella Bruin on 05/03/2023 14:34:45 -------------------------------------------------------------------------------- Multi-Disciplinary Care Plan Details Patient Name: Date of Service: [name], [name] 05/03/2023 10:00 A M Medical Record Number: [id number] Patient Account Number: [id number] Date of Birth/Sex: Treating RN: 05/21/1966 (57 y.o. [name], [name] Primary Care : [name] Other Clinician: Karl Bales Referring : Treating /Extender: [name] in Treatment: 3 Active Inactive Electronic Signature(s) Signed: 06/04/2023 12:50:21 PM By: [name] RN, BSN Entered By: [name] on 06/04/2023  12:50:21 -------------------------------------------------------------------------------- Vitals Details Patient Name: Date of Service: [name], [name] 05/03/2023 10:00 A M Medical Record Number: [id number] Patient Account Number: [id number] Date of Birth/Sex: Treating RN: 05/14/1966 (57 y.o. [name] Primary Care : [name] Other Clinician: Karl Bales Referring : Treating /Extender: [name] in Treatment: 3 Vital Signs Time Taken: 09:47 Temperature (F): 97.3 Height (in): 64 Pulse (bpm): 81 Weight (lbs): 225 Respiratory Rate (breaths/min): 18 Body Mass Index (BMI): 38.6 Blood Pressure (mmHg): 112/83 Capillary Blood Glucose (mg/dl): 601 Reference Range: 80 - 120 mg / dl [name], [name] ([id number]) [id number] Page 3 of 3 Electronic Signature(s) Signed: 05/03/2023 1:20:43 PM By: Flossie Dibble,  Casimiro Needle CHT EMT BS , , Entered By: Haywood Pao on 05/03/2023 13:20:43

## 2023-05-03 NOTE — Progress Notes (Addendum)
[place], [name] ([id number]) [id number] Page 1 of 2 Visit Report for 05/03/2023 HBO Details Patient Name: Date of Service: [name] 05/03/2023 10:00 A M Medical Record Number: [id number] Patient Account Number: [id number] Date of Birth/Sex: Treating RN: [birth year] (57 y.o. [name] Primary Care Josean Lycan: [name] Other Clinician: Karl Bales Referring Taeko Schaffer: Treating Donald Memoli/Extender: [name] in Treatment: 3 HBO Treatment Course Details Treatment Course Number: 1 Ordering Derrian Poli: Geralyn Corwin T Treatments Ordered: otal 40 HBO Treatment Start Date: 04/19/2023 HBO Indication: Diabetic Ulcer(s) of the Lower Extremity Standard/Conservative Wound Care tried and failed greater than or equal to 30 days Wound #17 Right, Plantar Foot HBO Treatment Details Treatment Number: 9 Patient Type: Outpatient Chamber Type: Monoplace Chamber Serial #: [id number] Treatment Protocol: 2.0 ATA with 90 minutes oxygen, with two 5 minute air breaks Treatment Details Compression Rate Down: 2.0 psi / minute De-Compression Rate Up: 2.0 psi / minute A breaks and breathing ir Compress Tx Pressure periods Decompress Decompress Begins Reached (leave unused spaces Begins Ends blank) Chamber Pressure (ATA 1 2 2 2 2 2  --2 1 ) Clock Time (24 hr) 10:35 10:42 11:12 11:18 11:48 11:53 - - 12:23 12:31 Treatment Length: 116 (minutes) Treatment Segments: 4 Vital Signs Capillary Blood Glucose Reference Range: 80 - 120 mg / dl HBO Diabetic Blood Glucose Intervention Range: <131 mg/dl or >937 mg/dl Time Vitals Blood Respiratory Capillary Blood Glucose Pulse Action Type: Pulse: Temperature: Taken: Pressure: Rate: Glucose (mg/dl): Meter #: Oximetry (%) Taken: Pre 09:47 112/83 81 18 97.3 205 Post 12:35 112/83 74 18 98.3 208 Treatment Response Treatment Toleration: Well Treatment Completion Status: Treatment Completed without  Adverse Event Additional Procedure Documentation Tissue Sevierity: Necrosis of muscle Physician HBO Attestation: I certify that I supervised this HBO treatment in accordance with Medicare guidelines. A trained emergency response team is readily available per Yes hospital policies and procedures. Continue HBOT as ordered. Yes Electronic Signature(s) Signed: 05/04/2023 9:53:20 AM By: Geralyn Corwin DO Previous Signature: 05/03/2023 1:59:51 PM Version By: Karl Bales EMT Previous Signature: 05/03/2023 1:59:15 PM Version By: Karl Bales EMT Entered By: Geralyn Corwin on 05/03/2023 15:48:30 [name], [name] ([id number]) [id number] Page 2 of 2 -------------------------------------------------------------------------------- HBO Safety Checklist Details Patient Name: Date of Service: Stacey, [name] 05/03/2023 10:00 A M Medical Record Number: [id number] Patient Account Number: [id number] Date of Birth/Sex: Treating RN: [birth year] (57 y.o. [name] Primary Care Jlyn Cerros: [name] Other Clinician: Karl Bales Referring Lakethia Coppess: Treating Parisha Beaulac/Extender: [name] in Treatment: 3 HBO Safety Checklist Items Safety Checklist Consent Form Signed Patient voided / foley secured and emptied When did you last eato 0900 Last dose of injectable or oral agent Last night Ostomy pouch emptied and vented if applicable NA All implantable devices assessed, documented and approved pain pump with saline approved Intravenous access site secured and place NA Removed 05/02/2023 Valuables secured Linens and cotton and cotton/polyester blend (less than 51% polyester) Personal oil-based products / skin lotions / body lotions removed Wigs or hairpieces removed Smoking or tobacco materials removed Books / newspapers / magazines / loose paper removed Cologne, aftershave, perfume and deodorant removed Jewelry removed (may wrap wedding  band) Make-up removed Hair care products removed Battery operated devices (external) removed Heating patches and chemical warmers removed Titanium eyewear removed NA Nail polish cured greater than 10 hours NA Casting material cured greater than 10 hours NA Hearing aids removed NA Loose dentures or partials removed NA Prosthetics have been removed NA Patient demonstrates correct use  of air break device (if applicable) Patient concerns have been addressed Patient grounding bracelet on and cord attached to chamber Specifics for Inpatients (complete in addition to above) Medication sheet sent with patient NA Intravenous medications needed or due during therapy sent with patient NA Drainage tubes (e.g. nasogastric tube or chest tube secured and vented) NA Endotracheal or Tracheotomy tube secured NA Cuff deflated of air and inflated with saline NA Airway suctioned NA Notes The safety checklist was done before the treatment was started. Electronic Signature(s) Signed: 05/03/2023 1:56:56 PM By: Karl Bales EMT Entered By: Karl Bales on 05/03/2023 13:56:56

## 2023-05-03 NOTE — Progress Notes (Signed)
MAYRIN, SCHMUCK ([id number]) [id number] Page 1 of 2 Visit Report for 05/02/2023 Problem List Details Patient Name: Date of Service: [name], [name] 05/02/2023 11:30 A M Medical Record Number: [id number] Patient Account Number: [id number] Date of Birth/Sex: Treating RN: 1965/11/12 (57 y.o. 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.421 Acquired absence of other right toe(s) Z89.422 Acquired absence of other  left toe(s) Facility Procedures : CPT4 Code Description: [id number]-(Facility Use Only) HBOT full body chamber, , ICD-10 Diagnosis Description E11.621 Type 2 diabetes mellitus with foot ulcer 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

## 2023-05-04 ENCOUNTER — Inpatient Hospital Stay (HOSPITAL_COMMUNITY): Payer: BC Managed Care – PPO

## 2023-05-04 ENCOUNTER — Encounter (HOSPITAL_COMMUNITY): Payer: Self-pay

## 2023-05-04 ENCOUNTER — Other Ambulatory Visit: Payer: Self-pay

## 2023-05-04 ENCOUNTER — Other Ambulatory Visit (HOSPITAL_COMMUNITY): Payer: Self-pay

## 2023-05-04 ENCOUNTER — Emergency Department (HOSPITAL_COMMUNITY): Payer: BC Managed Care – PPO

## 2023-05-04 ENCOUNTER — Encounter (HOSPITAL_BASED_OUTPATIENT_CLINIC_OR_DEPARTMENT_OTHER): Payer: BC Managed Care – PPO | Admitting: Internal Medicine

## 2023-05-04 ENCOUNTER — Inpatient Hospital Stay (HOSPITAL_COMMUNITY)
Admission: EM | Admit: 2023-05-04 | Discharge: 2023-06-24 | DRG: 228 | Disposition: E | Payer: BC Managed Care – PPO | Attending: Thoracic Surgery (Cardiothoracic Vascular Surgery) | Admitting: Thoracic Surgery (Cardiothoracic Vascular Surgery)

## 2023-05-04 ENCOUNTER — Inpatient Hospital Stay (HOSPITAL_COMMUNITY)
Admission: EM | Disposition: E | Payer: Self-pay | Source: Home / Self Care | Attending: Thoracic Surgery (Cardiothoracic Vascular Surgery)

## 2023-05-04 DIAGNOSIS — I2109 ST elevation (STEMI) myocardial infarction involving other coronary artery of anterior wall: Principal | ICD-10-CM | POA: Diagnosis present

## 2023-05-04 DIAGNOSIS — I110 Hypertensive heart disease with heart failure: Secondary | ICD-10-CM | POA: Diagnosis present

## 2023-05-04 DIAGNOSIS — G4733 Obstructive sleep apnea (adult) (pediatric): Secondary | ICD-10-CM | POA: Diagnosis present

## 2023-05-04 DIAGNOSIS — I5021 Acute systolic (congestive) heart failure: Secondary | ICD-10-CM | POA: Diagnosis not present

## 2023-05-04 DIAGNOSIS — R1110 Vomiting, unspecified: Secondary | ICD-10-CM | POA: Diagnosis not present

## 2023-05-04 DIAGNOSIS — Z95811 Presence of heart assist device: Secondary | ICD-10-CM | POA: Diagnosis not present

## 2023-05-04 DIAGNOSIS — I2102 ST elevation (STEMI) myocardial infarction involving left anterior descending coronary artery: Secondary | ICD-10-CM | POA: Diagnosis not present

## 2023-05-04 DIAGNOSIS — T8131XA Disruption of external operation (surgical) wound, not elsewhere classified, initial encounter: Secondary | ICD-10-CM | POA: Diagnosis not present

## 2023-05-04 DIAGNOSIS — R197 Diarrhea, unspecified: Secondary | ICD-10-CM | POA: Diagnosis not present

## 2023-05-04 DIAGNOSIS — E11622 Type 2 diabetes mellitus with other skin ulcer: Secondary | ICD-10-CM | POA: Diagnosis present

## 2023-05-04 DIAGNOSIS — R571 Hypovolemic shock: Secondary | ICD-10-CM | POA: Diagnosis not present

## 2023-05-04 DIAGNOSIS — I5023 Acute on chronic systolic (congestive) heart failure: Secondary | ICD-10-CM | POA: Diagnosis present

## 2023-05-04 DIAGNOSIS — M3131 Wegener's granulomatosis with renal involvement: Secondary | ICD-10-CM | POA: Diagnosis present

## 2023-05-04 DIAGNOSIS — Z86711 Personal history of pulmonary embolism: Secondary | ICD-10-CM

## 2023-05-04 DIAGNOSIS — E871 Hypo-osmolality and hyponatremia: Secondary | ICD-10-CM | POA: Diagnosis not present

## 2023-05-04 DIAGNOSIS — G909 Disorder of the autonomic nervous system, unspecified: Secondary | ICD-10-CM | POA: Diagnosis present

## 2023-05-04 DIAGNOSIS — I2510 Atherosclerotic heart disease of native coronary artery without angina pectoris: Secondary | ICD-10-CM | POA: Diagnosis not present

## 2023-05-04 DIAGNOSIS — E039 Hypothyroidism, unspecified: Secondary | ICD-10-CM | POA: Diagnosis present

## 2023-05-04 DIAGNOSIS — I361 Nonrheumatic tricuspid (valve) insufficiency: Secondary | ICD-10-CM | POA: Diagnosis present

## 2023-05-04 DIAGNOSIS — T8132XA Disruption of internal operation (surgical) wound, not elsewhere classified, initial encounter: Secondary | ICD-10-CM | POA: Diagnosis not present

## 2023-05-04 DIAGNOSIS — I480 Paroxysmal atrial fibrillation: Secondary | ICD-10-CM | POA: Diagnosis present

## 2023-05-04 DIAGNOSIS — I241 Dressler's syndrome: Secondary | ICD-10-CM | POA: Diagnosis not present

## 2023-05-04 DIAGNOSIS — I469 Cardiac arrest, cause unspecified: Secondary | ICD-10-CM | POA: Diagnosis not present

## 2023-05-04 DIAGNOSIS — D6959 Other secondary thrombocytopenia: Secondary | ICD-10-CM | POA: Diagnosis not present

## 2023-05-04 DIAGNOSIS — R053 Chronic cough: Secondary | ICD-10-CM | POA: Diagnosis present

## 2023-05-04 DIAGNOSIS — I440 Atrioventricular block, first degree: Secondary | ICD-10-CM | POA: Diagnosis not present

## 2023-05-04 DIAGNOSIS — J9601 Acute respiratory failure with hypoxia: Secondary | ICD-10-CM | POA: Diagnosis not present

## 2023-05-04 DIAGNOSIS — G8929 Other chronic pain: Secondary | ICD-10-CM | POA: Diagnosis present

## 2023-05-04 DIAGNOSIS — M62838 Other muscle spasm: Secondary | ICD-10-CM | POA: Diagnosis not present

## 2023-05-04 DIAGNOSIS — Z833 Family history of diabetes mellitus: Secondary | ICD-10-CM

## 2023-05-04 DIAGNOSIS — J9602 Acute respiratory failure with hypercapnia: Secondary | ICD-10-CM | POA: Diagnosis not present

## 2023-05-04 DIAGNOSIS — N170 Acute kidney failure with tubular necrosis: Secondary | ICD-10-CM | POA: Diagnosis present

## 2023-05-04 DIAGNOSIS — T17990A Other foreign object in respiratory tract, part unspecified in causing asphyxiation, initial encounter: Secondary | ICD-10-CM | POA: Diagnosis not present

## 2023-05-04 DIAGNOSIS — Z86718 Personal history of other venous thrombosis and embolism: Secondary | ICD-10-CM

## 2023-05-04 DIAGNOSIS — E874 Mixed disorder of acid-base balance: Secondary | ICD-10-CM | POA: Diagnosis not present

## 2023-05-04 DIAGNOSIS — Z886 Allergy status to analgesic agent status: Secondary | ICD-10-CM

## 2023-05-04 DIAGNOSIS — E875 Hyperkalemia: Secondary | ICD-10-CM | POA: Diagnosis present

## 2023-05-04 DIAGNOSIS — Z89421 Acquired absence of other right toe(s): Secondary | ICD-10-CM

## 2023-05-04 DIAGNOSIS — R011 Cardiac murmur, unspecified: Secondary | ICD-10-CM | POA: Diagnosis not present

## 2023-05-04 DIAGNOSIS — K219 Gastro-esophageal reflux disease without esophagitis: Secondary | ICD-10-CM | POA: Diagnosis present

## 2023-05-04 DIAGNOSIS — M954 Acquired deformity of chest and rib: Secondary | ICD-10-CM | POA: Diagnosis not present

## 2023-05-04 DIAGNOSIS — Z7984 Long term (current) use of oral hypoglycemic drugs: Secondary | ICD-10-CM

## 2023-05-04 DIAGNOSIS — M9689 Other intraoperative and postprocedural complications and disorders of the musculoskeletal system: Secondary | ICD-10-CM | POA: Diagnosis not present

## 2023-05-04 DIAGNOSIS — E785 Hyperlipidemia, unspecified: Secondary | ICD-10-CM | POA: Diagnosis present

## 2023-05-04 DIAGNOSIS — E11621 Type 2 diabetes mellitus with foot ulcer: Secondary | ICD-10-CM | POA: Diagnosis present

## 2023-05-04 DIAGNOSIS — Z713 Dietary counseling and surveillance: Secondary | ICD-10-CM

## 2023-05-04 DIAGNOSIS — R001 Bradycardia, unspecified: Secondary | ICD-10-CM | POA: Diagnosis not present

## 2023-05-04 DIAGNOSIS — D75839 Thrombocytosis, unspecified: Secondary | ICD-10-CM | POA: Diagnosis not present

## 2023-05-04 DIAGNOSIS — E861 Hypovolemia: Secondary | ICD-10-CM | POA: Diagnosis not present

## 2023-05-04 DIAGNOSIS — J440 Chronic obstructive pulmonary disease with acute lower respiratory infection: Secondary | ICD-10-CM | POA: Diagnosis not present

## 2023-05-04 DIAGNOSIS — E7849 Other hyperlipidemia: Secondary | ICD-10-CM

## 2023-05-04 DIAGNOSIS — Z95828 Presence of other vascular implants and grafts: Secondary | ICD-10-CM

## 2023-05-04 DIAGNOSIS — J9811 Atelectasis: Secondary | ICD-10-CM | POA: Diagnosis not present

## 2023-05-04 DIAGNOSIS — Z5986 Financial insecurity: Secondary | ICD-10-CM

## 2023-05-04 DIAGNOSIS — Z6841 Body Mass Index (BMI) 40.0 and over, adult: Secondary | ICD-10-CM

## 2023-05-04 DIAGNOSIS — J810 Acute pulmonary edema: Secondary | ICD-10-CM | POA: Diagnosis not present

## 2023-05-04 DIAGNOSIS — Y95 Nosocomial condition: Secondary | ICD-10-CM | POA: Diagnosis not present

## 2023-05-04 DIAGNOSIS — E876 Hypokalemia: Secondary | ICD-10-CM | POA: Diagnosis not present

## 2023-05-04 DIAGNOSIS — Z880 Allergy status to penicillin: Secondary | ICD-10-CM

## 2023-05-04 DIAGNOSIS — E1142 Type 2 diabetes mellitus with diabetic polyneuropathy: Secondary | ICD-10-CM | POA: Diagnosis present

## 2023-05-04 DIAGNOSIS — R570 Cardiogenic shock: Secondary | ICD-10-CM | POA: Diagnosis present

## 2023-05-04 DIAGNOSIS — Z9049 Acquired absence of other specified parts of digestive tract: Secondary | ICD-10-CM

## 2023-05-04 DIAGNOSIS — Z89422 Acquired absence of other left toe(s): Secondary | ICD-10-CM

## 2023-05-04 DIAGNOSIS — D62 Acute posthemorrhagic anemia: Secondary | ICD-10-CM | POA: Diagnosis not present

## 2023-05-04 DIAGNOSIS — Z1152 Encounter for screening for COVID-19: Secondary | ICD-10-CM

## 2023-05-04 DIAGNOSIS — E1165 Type 2 diabetes mellitus with hyperglycemia: Secondary | ICD-10-CM | POA: Diagnosis present

## 2023-05-04 DIAGNOSIS — I232 Ventricular septal defect as current complication following acute myocardial infarction: Secondary | ICD-10-CM | POA: Diagnosis present

## 2023-05-04 DIAGNOSIS — J189 Pneumonia, unspecified organism: Secondary | ICD-10-CM | POA: Diagnosis not present

## 2023-05-04 DIAGNOSIS — Z79899 Other long term (current) drug therapy: Secondary | ICD-10-CM

## 2023-05-04 DIAGNOSIS — L98492 Non-pressure chronic ulcer of skin of other sites with fat layer exposed: Secondary | ICD-10-CM | POA: Diagnosis present

## 2023-05-04 DIAGNOSIS — I252 Old myocardial infarction: Secondary | ICD-10-CM

## 2023-05-04 DIAGNOSIS — Z8249 Family history of ischemic heart disease and other diseases of the circulatory system: Secondary | ICD-10-CM

## 2023-05-04 DIAGNOSIS — E1151 Type 2 diabetes mellitus with diabetic peripheral angiopathy without gangrene: Secondary | ICD-10-CM | POA: Diagnosis present

## 2023-05-04 DIAGNOSIS — Z90710 Acquired absence of both cervix and uterus: Secondary | ICD-10-CM

## 2023-05-04 DIAGNOSIS — J9589 Other postprocedural complications and disorders of respiratory system, not elsewhere classified: Secondary | ICD-10-CM | POA: Diagnosis not present

## 2023-05-04 DIAGNOSIS — M79661 Pain in right lower leg: Secondary | ICD-10-CM | POA: Diagnosis not present

## 2023-05-04 DIAGNOSIS — Z79890 Hormone replacement therapy (postmenopausal): Secondary | ICD-10-CM

## 2023-05-04 DIAGNOSIS — G90A Postural orthostatic tachycardia syndrome (POTS): Secondary | ICD-10-CM | POA: Diagnosis present

## 2023-05-04 HISTORY — PX: LEFT HEART CATH AND CORONARY ANGIOGRAPHY: CATH118249

## 2023-05-04 HISTORY — PX: CORONARY/GRAFT ACUTE MI REVASCULARIZATION: CATH118305

## 2023-05-04 LAB — CBC WITH DIFFERENTIAL/PLATELET
Abs Immature Granulocytes: 0.18 10*3/uL — ABNORMAL HIGH (ref 0.00–0.07)
Basophils Absolute: 0.1 10*3/uL (ref 0.0–0.1)
Basophils Relative: 1 %
Eosinophils Absolute: 0.4 10*3/uL (ref 0.0–0.5)
Eosinophils Relative: 2 %
HCT: 41.2 % (ref 36.0–46.0)
Hemoglobin: 13.1 g/dL (ref 12.0–15.0)
Immature Granulocytes: 1 %
Lymphocytes Relative: 37 %
Lymphs Abs: 6 10*3/uL — ABNORMAL HIGH (ref 0.7–4.0)
MCH: 28.9 pg (ref 26.0–34.0)
MCHC: 31.8 g/dL (ref 30.0–36.0)
MCV: 90.7 fL (ref 80.0–100.0)
Monocytes Absolute: 1.1 10*3/uL — ABNORMAL HIGH (ref 0.1–1.0)
Monocytes Relative: 7 %
Neutro Abs: 8.4 10*3/uL — ABNORMAL HIGH (ref 1.7–7.7)
Neutrophils Relative %: 52 %
Platelets: 292 10*3/uL (ref 150–400)
RBC: 4.54 MIL/uL (ref 3.87–5.11)
RDW: 13.6 % (ref 11.5–15.5)
WBC: 16.1 10*3/uL — ABNORMAL HIGH (ref 4.0–10.5)
nRBC: 0 % (ref 0.0–0.2)

## 2023-05-04 LAB — MRSA NEXT GEN BY PCR, NASAL: MRSA by PCR Next Gen: NOT DETECTED

## 2023-05-04 LAB — COOXEMETRY PANEL
Carboxyhemoglobin: 1.6 % — ABNORMAL HIGH (ref 0.5–1.5)
Methemoglobin: <0.7 % (ref 0.0–1.5)
O2 Saturation: 51.6 %
Total hemoglobin: 12.5 g/dL (ref 12.0–16.0)

## 2023-05-04 LAB — COMPREHENSIVE METABOLIC PANEL
ALT: 21 U/L (ref 0–44)
AST: 31 U/L (ref 15–41)
Albumin: 3.5 g/dL (ref 3.5–5.0)
Alkaline Phosphatase: 137 U/L — ABNORMAL HIGH (ref 38–126)
Anion gap: 15 (ref 5–15)
BUN: 13 mg/dL (ref 6–20)
CO2: 19 mmol/L — ABNORMAL LOW (ref 22–32)
Calcium: 9.3 mg/dL (ref 8.9–10.3)
Chloride: 103 mmol/L (ref 98–111)
Creatinine, Ser: 1.06 mg/dL — ABNORMAL HIGH (ref 0.44–1.00)
GFR, Estimated: >60 mL/min (ref >60)
Glucose, Bld: 269 mg/dL — ABNORMAL HIGH (ref 70–99)
Potassium: 4.2 mmol/L (ref 3.5–5.1)
Sodium: 137 mmol/L (ref 135–145)
Total Bilirubin: 0.4 mg/dL (ref 0.3–1.2)
Total Protein: 6.3 g/dL — ABNORMAL LOW (ref 6.5–8.1)

## 2023-05-04 LAB — ECHOCARDIOGRAM COMPLETE
AR max vel: 1.31 cm2
AV Peak grad: 7.5 mmHg
Ao pk vel: 1.37 m/s
Area-P 1/2: 4.21 cm2
Calc EF: 53.8 %
Height: 64 in
S' Lateral: 3.7 cm
Single Plane A2C EF: 53.7 %
Single Plane A4C EF: 52.8 %
Weight: 3520 oz

## 2023-05-04 LAB — D-DIMER, QUANTITATIVE: D-Dimer, Quant: 0.46 ug/mL-FEU (ref 0.00–0.50)

## 2023-05-04 LAB — LACTIC ACID, PLASMA
Lactic Acid, Venous: 3.2 mmol/L (ref 0.5–1.9)
Lactic Acid, Venous: 4.3 mmol/L (ref 0.5–1.9)
Lactic Acid, Venous: 4.2 mmol/L (ref 0.5–1.9)

## 2023-05-04 LAB — LIPID PANEL
Cholesterol: 165 mg/dL (ref 0–200)
HDL: 36 mg/dL — ABNORMAL LOW (ref >40)
LDL Cholesterol: UNDETERMINED mg/dL (ref 0–99)
Total CHOL/HDL Ratio: 4.6 RATIO
Triglycerides: 401 mg/dL — ABNORMAL HIGH (ref <150)
VLDL: UNDETERMINED mg/dL (ref 0–40)

## 2023-05-04 LAB — POCT ACTIVATED CLOTTING TIME
Activated Clotting Time: 256 seconds
Activated Clotting Time: 275 seconds

## 2023-05-04 LAB — LDL CHOLESTEROL, DIRECT: Direct LDL: 65 mg/dL (ref 0–99)

## 2023-05-04 LAB — I-STAT CG4 LACTIC ACID, ED: Lactic Acid, Venous: 2.9 mmol/L (ref 0.5–1.9)

## 2023-05-04 LAB — PROTIME-INR
INR: 1 (ref 0.8–1.2)
Prothrombin Time: 13.4 seconds (ref 11.4–15.2)

## 2023-05-04 LAB — GLUCOSE, CAPILLARY
Glucose-Capillary: 254 mg/dL — ABNORMAL HIGH (ref 70–99)
Glucose-Capillary: 215 mg/dL — ABNORMAL HIGH (ref 70–99)
Glucose-Capillary: 235 mg/dL — ABNORMAL HIGH (ref 70–99)

## 2023-05-04 LAB — TROPONIN I (HIGH SENSITIVITY)
Troponin I (High Sensitivity): 2536 ng/L (ref <18)
Troponin I (High Sensitivity): >24000 ng/L (ref <18)

## 2023-05-04 LAB — BRAIN NATRIURETIC PEPTIDE: B Natriuretic Peptide: 123.2 pg/mL — ABNORMAL HIGH (ref 0.0–100.0)

## 2023-05-04 LAB — APTT: aPTT: 27 seconds (ref 24–36)

## 2023-05-04 SURGERY — LEFT HEART CATH AND CORONARY ANGIOGRAPHY
Anesthesia: LOCAL

## 2023-05-04 MED ORDER — MIDAZOLAM HCL 2 MG/2ML IJ SOLN
INTRAMUSCULAR | Status: AC
  Filled 2023-05-04: qty 2

## 2023-05-04 MED ORDER — PERFLUTREN LIPID MICROSPHERE
1.0000 mL | INTRAVENOUS | Status: AC | PRN
  Administered 2023-05-04: 3 mL via INTRAVENOUS

## 2023-05-04 MED ORDER — TICAGRELOR 90 MG PO TABS
ORAL_TABLET | ORAL | Status: DC | PRN
  Administered 2023-05-04: 180 mg via ORAL

## 2023-05-04 MED ORDER — LINACLOTIDE 145 MCG PO CAPS
145.0000 ug | ORAL_CAPSULE | Freq: Every day | ORAL | Status: DC
  Administered 2023-05-05 – 2023-05-10 (×6): 145 ug via ORAL
  Filled 2023-05-04 (×8): qty 1

## 2023-05-04 MED ORDER — NITROGLYCERIN 1 MG/10 ML FOR IR/CATH LAB
INTRA_ARTERIAL | Status: DC | PRN
  Administered 2023-05-04: 50 ug via INTRACORONARY

## 2023-05-04 MED ORDER — SODIUM CHLORIDE 0.9 % IV SOLN
250.0000 mL | INTRAVENOUS | Status: DC | PRN
  Administered 2023-05-04: 250 mL via INTRAVENOUS

## 2023-05-04 MED ORDER — FENTANYL CITRATE PF 50 MCG/ML IJ SOSY
100.0000 ug | PREFILLED_SYRINGE | INTRAMUSCULAR | Status: DC | PRN
  Administered 2023-05-04: 100 ug via INTRAVENOUS
  Filled 2023-05-04: qty 2

## 2023-05-04 MED ORDER — MIDAZOLAM HCL 2 MG/2ML IJ SOLN
INTRAMUSCULAR | Status: DC | PRN
  Administered 2023-05-04 (×2): 1 mg via INTRAVENOUS

## 2023-05-04 MED ORDER — SODIUM CHLORIDE 0.9% FLUSH
3.0000 mL | Freq: Two times a day (BID) | INTRAVENOUS | Status: DC
  Administered 2023-05-04 – 2023-05-10 (×6): 3 mL via INTRAVENOUS

## 2023-05-04 MED ORDER — INSULIN ASPART 100 UNIT/ML IJ SOLN
0.0000 [IU] | Freq: Three times a day (TID) | INTRAMUSCULAR | Status: DC
  Administered 2023-05-04: 5 [IU] via SUBCUTANEOUS
  Administered 2023-05-05 (×2): 8 [IU] via SUBCUTANEOUS
  Administered 2023-05-05: 5 [IU] via SUBCUTANEOUS
  Administered 2023-05-06 – 2023-05-07 (×4): 8 [IU] via SUBCUTANEOUS

## 2023-05-04 MED ORDER — MORPHINE SULFATE (PF) 2 MG/ML IV SOLN
2.0000 mg | INTRAVENOUS | Status: DC | PRN
  Administered 2023-05-04 (×2): 2 mg via INTRAVENOUS
  Administered 2023-05-04: 1 mg via INTRAVENOUS
  Administered 2023-05-04 – 2023-05-08 (×11): 2 mg via INTRAVENOUS
  Filled 2023-05-04 (×14): qty 1

## 2023-05-04 MED ORDER — VERAPAMIL HCL 2.5 MG/ML IV SOLN: INTRAVENOUS | Status: DC | PRN

## 2023-05-04 MED ORDER — HYDROCODONE-ACETAMINOPHEN 5-325 MG PO TABS
1.0000 | ORAL_TABLET | Freq: Two times a day (BID) | ORAL | Status: DC | PRN
  Administered 2023-05-04 – 2023-05-10 (×9): 1 via ORAL
  Filled 2023-05-04 (×10): qty 1

## 2023-05-04 MED ORDER — FUROSEMIDE 10 MG/ML IJ SOLN
40.0000 mg | Freq: Once | INTRAMUSCULAR | Status: AC
  Administered 2023-05-04: 40 mg via INTRAVENOUS
  Filled 2023-05-04: qty 4

## 2023-05-04 MED ORDER — DULOXETINE HCL 60 MG PO CPEP
60.0000 mg | ORAL_CAPSULE | Freq: Two times a day (BID) | ORAL | Status: DC
  Administered 2023-05-04 – 2023-05-10 (×12): 60 mg via ORAL
  Filled 2023-05-04 (×13): qty 1

## 2023-05-04 MED ORDER — SENNA 8.6 MG PO TABS
2.0000 | ORAL_TABLET | Freq: Two times a day (BID) | ORAL | Status: DC
  Administered 2023-05-04 – 2023-05-06 (×4): 17.2 mg via ORAL
  Filled 2023-05-04 (×6): qty 2

## 2023-05-04 MED ORDER — HEPARIN (PORCINE) IN NACL 1000-0.9 UT/500ML-% IV SOLN
INTRAVENOUS | Status: DC | PRN
  Administered 2023-05-04: 1000 mL

## 2023-05-04 MED ORDER — TOPIRAMATE 100 MG PO TABS
100.0000 mg | ORAL_TABLET | Freq: Every day | ORAL | Status: DC
  Administered 2023-05-04 – 2023-05-10 (×7): 100 mg via ORAL
  Filled 2023-05-04 (×8): qty 1

## 2023-05-04 MED ORDER — PANTOPRAZOLE SODIUM 40 MG PO TBEC
40.0000 mg | DELAYED_RELEASE_TABLET | Freq: Two times a day (BID) | ORAL | Status: DC
  Administered 2023-05-04 – 2023-05-10 (×13): 40 mg via ORAL
  Filled 2023-05-04 (×16): qty 1

## 2023-05-04 MED ORDER — ROSUVASTATIN CALCIUM 20 MG PO TABS
40.0000 mg | ORAL_TABLET | Freq: Every day | ORAL | Status: DC
  Administered 2023-05-04 – 2023-05-10 (×7): 40 mg via ORAL
  Filled 2023-05-04 (×8): qty 2

## 2023-05-04 MED ORDER — MAGNESIUM OXIDE 400 MG PO TABS
400.0000 mg | ORAL_TABLET | Freq: Every day | ORAL | Status: DC
  Filled 2023-05-04: qty 1

## 2023-05-04 MED ORDER — VERAPAMIL HCL 2.5 MG/ML IV SOLN
INTRAVENOUS | Status: AC
  Filled 2023-05-04: qty 2

## 2023-05-04 MED ORDER — AMITRIPTYLINE HCL 50 MG PO TABS
50.0000 mg | ORAL_TABLET | Freq: Every day | ORAL | Status: DC
  Administered 2023-05-04 – 2023-05-10 (×6): 50 mg via ORAL
  Filled 2023-05-04 (×8): qty 1

## 2023-05-04 MED ORDER — TICAGRELOR 90 MG PO TABS
90.0000 mg | ORAL_TABLET | Freq: Two times a day (BID) | ORAL | Status: DC
  Administered 2023-05-04 – 2023-05-07 (×6): 90 mg via ORAL
  Filled 2023-05-04 (×6): qty 1

## 2023-05-04 MED ORDER — VASOPRESSIN 20 UNITS/100 ML INFUSION FOR SHOCK
0.0000 [IU]/min | INTRAVENOUS | Status: DC
  Filled 2023-05-04: qty 100

## 2023-05-04 MED ORDER — LABETALOL HCL 5 MG/ML IV SOLN: 10.0000 mg | INTRAVENOUS | Status: AC | PRN

## 2023-05-04 MED ORDER — ENOXAPARIN SODIUM 40 MG/0.4ML IJ SOSY
40.0000 mg | PREFILLED_SYRINGE | INTRAMUSCULAR | Status: DC
  Administered 2023-05-05 – 2023-05-06 (×2): 40 mg via SUBCUTANEOUS
  Filled 2023-05-04 (×2): qty 0.4

## 2023-05-04 MED ORDER — SODIUM CHLORIDE 0.9% FLUSH: 10.0000 mL | INTRAVENOUS | Status: DC | PRN

## 2023-05-04 MED ORDER — GABAPENTIN 300 MG PO CAPS: 300.0000 mg | ORAL_CAPSULE | Freq: Every day | ORAL | Status: DC

## 2023-05-04 MED ORDER — LEVOTHYROXINE SODIUM 50 MCG PO TABS
25.0000 ug | ORAL_TABLET | Freq: Every day | ORAL | Status: DC
  Administered 2023-05-04 – 2023-05-10 (×7): 25 ug via ORAL
  Filled 2023-05-04 (×7): qty 1

## 2023-05-04 MED ORDER — ONDANSETRON HCL 4 MG/2ML IJ SOLN: 4.0000 mg | Freq: Four times a day (QID) | INTRAMUSCULAR | Status: DC | PRN

## 2023-05-04 MED ORDER — ACETAMINOPHEN 325 MG PO TABS
650.0000 mg | ORAL_TABLET | Freq: Four times a day (QID) | ORAL | Status: DC | PRN
  Administered 2023-05-04 – 2023-05-06 (×2): 650 mg via ORAL
  Filled 2023-05-04 (×2): qty 2

## 2023-05-04 MED ORDER — GABAPENTIN 300 MG PO CAPS
600.0000 mg | ORAL_CAPSULE | Freq: Three times a day (TID) | ORAL | Status: DC
  Administered 2023-05-04 – 2023-05-10 (×18): 600 mg via ORAL
  Filled 2023-05-04 (×19): qty 2

## 2023-05-04 MED ORDER — MILRINONE LACTATE IN DEXTROSE 20-5 MG/100ML-% IV SOLN
0.2500 ug/kg/min | INTRAVENOUS | Status: DC
  Administered 2023-05-04: 0.125 ug/kg/min via INTRAVENOUS
  Administered 2023-05-05 – 2023-05-06 (×2): 0.2 ug/kg/min via INTRAVENOUS
  Filled 2023-05-04 (×3): qty 100

## 2023-05-04 MED ORDER — SODIUM CHLORIDE 0.9% FLUSH: 3.0000 mL | INTRAVENOUS | Status: DC | PRN

## 2023-05-04 MED ORDER — TICAGRELOR 90 MG PO TABS
ORAL_TABLET | ORAL | Status: AC
  Filled 2023-05-04: qty 1

## 2023-05-04 MED ORDER — CHLORHEXIDINE GLUCONATE CLOTH 2 % EX PADS
6.0000 | MEDICATED_PAD | Freq: Every day | CUTANEOUS | Status: DC
  Administered 2023-05-04 – 2023-05-10 (×8): 6 via TOPICAL

## 2023-05-04 MED ORDER — SODIUM CHLORIDE 0.9% FLUSH
10.0000 mL | Freq: Two times a day (BID) | INTRAVENOUS | Status: DC
  Administered 2023-05-04 – 2023-05-10 (×11): 10 mL

## 2023-05-04 MED ORDER — FENTANYL CITRATE (PF) 100 MCG/2ML IJ SOLN
INTRAMUSCULAR | Status: DC | PRN
  Administered 2023-05-04: 25 ug via INTRAVENOUS

## 2023-05-04 MED ORDER — FENTANYL CITRATE (PF) 100 MCG/2ML IJ SOLN
INTRAMUSCULAR | Status: AC
  Filled 2023-05-04: qty 2

## 2023-05-04 MED ORDER — HEPARIN SODIUM (PORCINE) 5000 UNIT/ML IJ SOLN
4000.0000 [IU] | Freq: Once | INTRAMUSCULAR | Status: AC
  Administered 2023-05-04: 4000 [IU] via INTRAVENOUS
  Filled 2023-05-04: qty 1

## 2023-05-04 MED ORDER — ALBUTEROL SULFATE (2.5 MG/3ML) 0.083% IN NEBU
2.5000 mg | INHALATION_SOLUTION | Freq: Four times a day (QID) | RESPIRATORY_TRACT | Status: DC | PRN
  Administered 2023-05-05 – 2023-05-07 (×3): 2.5 mg via RESPIRATORY_TRACT
  Filled 2023-05-04 (×4): qty 3

## 2023-05-04 MED ORDER — INSULIN ASPART 100 UNIT/ML IJ SOLN
0.0000 [IU] | Freq: Every day | INTRAMUSCULAR | Status: DC
  Administered 2023-05-04 – 2023-05-05 (×2): 2 [IU] via SUBCUTANEOUS
  Administered 2023-05-07 – 2023-05-08 (×2): 3 [IU] via SUBCUTANEOUS
  Administered 2023-05-09: 2 [IU] via SUBCUTANEOUS

## 2023-05-04 MED ORDER — ASPIRIN 81 MG PO CHEW
162.0000 mg | CHEWABLE_TABLET | Freq: Once | ORAL | Status: AC
  Administered 2023-05-04: 162 mg via ORAL
  Filled 2023-05-04: qty 2

## 2023-05-04 MED ORDER — ALBUTEROL SULFATE HFA 108 (90 BASE) MCG/ACT IN AERS: 2.0000 | INHALATION_SPRAY | Freq: Four times a day (QID) | RESPIRATORY_TRACT | Status: DC | PRN

## 2023-05-04 MED ORDER — LIDOCAINE 5 % EX PTCH
1.0000 | MEDICATED_PATCH | Freq: Every day | CUTANEOUS | Status: DC | PRN
  Administered 2023-05-04 – 2023-05-21 (×6): 1 via TRANSDERMAL
  Filled 2023-05-04 (×8): qty 1

## 2023-05-04 MED ORDER — MAGNESIUM OXIDE -MG SUPPLEMENT 400 (240 MG) MG PO TABS
400.0000 mg | ORAL_TABLET | Freq: Every day | ORAL | Status: DC
  Administered 2023-05-04 – 2023-05-10 (×7): 400 mg via ORAL
  Filled 2023-05-04 (×8): qty 1

## 2023-05-04 MED ORDER — HYDRALAZINE HCL 20 MG/ML IJ SOLN: 10.0000 mg | INTRAMUSCULAR | Status: AC | PRN

## 2023-05-04 MED ORDER — HEPARIN SODIUM (PORCINE) 1000 UNIT/ML IJ SOLN
INTRAMUSCULAR | Status: AC
  Filled 2023-05-04: qty 10

## 2023-05-04 MED ORDER — SODIUM CHLORIDE 0.9 % IV SOLN: INTRAVENOUS | Status: DC

## 2023-05-04 MED ORDER — LIDOCAINE HCL (PF) 1 % IJ SOLN
INTRAMUSCULAR | Status: AC
  Filled 2023-05-04: qty 30

## 2023-05-04 MED ORDER — ASPIRIN 81 MG PO CHEW: 81.0000 mg | CHEWABLE_TABLET | Freq: Every day | ORAL | Status: DC

## 2023-05-04 MED ORDER — ACETAMINOPHEN 325 MG PO TABS: 650.0000 mg | ORAL_TABLET | ORAL | Status: DC | PRN

## 2023-05-04 MED ORDER — IOHEXOL 350 MG/ML SOLN
INTRAVENOUS | Status: DC | PRN
  Administered 2023-05-04: 105 mL

## 2023-05-04 MED ORDER — HEPARIN SODIUM (PORCINE) 1000 UNIT/ML IJ SOLN
INTRAMUSCULAR | Status: DC | PRN
  Administered 2023-05-04: 4000 [IU] via INTRAVENOUS
  Administered 2023-05-04: 2000 [IU] via INTRAVENOUS
  Administered 2023-05-04: 3000 [IU] via INTRAVENOUS

## 2023-05-04 MED ORDER — METHOCARBAMOL 500 MG PO TABS
500.0000 mg | ORAL_TABLET | Freq: Four times a day (QID) | ORAL | Status: DC | PRN
  Administered 2023-05-04 – 2023-05-26 (×23): 500 mg via ORAL
  Filled 2023-05-04 (×25): qty 1

## 2023-05-04 MED ORDER — NITROGLYCERIN 1 MG/10 ML FOR IR/CATH LAB
INTRA_ARTERIAL | Status: AC
  Filled 2023-05-04: qty 10

## 2023-05-04 SURGICAL SUPPLY — 18 items
BALLN EMERGE MR 2.5X15 (BALLOONS) ×1
BALLN ~~LOC~~ EMERGE MR 3.25X20 (BALLOONS) ×1
BALLOON EMERGE MR 2.5X15 (BALLOONS) IMPLANT
BALLOON ~~LOC~~ EMERGE MR 3.25X20 (BALLOONS) IMPLANT
CATH INFINITI JR4 5F (CATHETERS) IMPLANT
CATH VISTA GUIDE 6FR XBLAD3.0 (CATHETERS) IMPLANT
DEVICE RAD COMP TR BAND LRG (VASCULAR PRODUCTS) IMPLANT
GLIDESHEATH SLEND SS 6F .021 (SHEATH) IMPLANT
GUIDEWIRE INQWIRE 1.5J.035X260 (WIRE) IMPLANT
INQWIRE 1.5J .035X260CM (WIRE) ×1
KIT ENCORE 26 ADVANTAGE (KITS) IMPLANT
KIT HEART LEFT (KITS) ×1 IMPLANT
PACK CARDIAC CATHETERIZATION (CUSTOM PROCEDURE TRAY) ×1 IMPLANT
SHEATH PROBE COVER 6X72 (BAG) IMPLANT
STENT SYNERGY XD 3.0X32 (Permanent Stent) IMPLANT
TRANSDUCER W/STOPCOCK (MISCELLANEOUS) ×1 IMPLANT
TUBING CIL FLEX 10 FLL-RA (TUBING) ×1 IMPLANT
WIRE RUNTHROUGH .014X180CM (WIRE) IMPLANT

## 2023-05-04 NOTE — ED Notes (Signed)
Date and time results received: 05/04/23 0628  (use smartphrase ".now" to insert current time)  Test: Lactic Acid Critical Value: 2.9  Name of Provider Notified: Mesner   Orders Received? Or Actions Taken?:

## 2023-05-04 NOTE — Consult Note (Addendum)
Advanced Heart Failure Team Consult Note   Primary Physician: [name], [name] PCP-Cardiologist:  Peter Swaziland, MD  Reason for Consultation: STEMI> Cardiogenic shock  HPI:    [name] is seen today for evaluation of OSA, STEMI, s/p PCI now in cardiogenic shock at the request of Dr. Excell Seltzer, Interventional Cardiology.   [name] is a 57 y.o. female with osteomyelitis, neurocardiogenic syncope , wegener's disease, chronic pain, DVT and PE 6/23 s/p IVC filter +eliquis, obesity and diabetes, admitted with STEMI s/p PDI to LAD now in cardiogenic shock.   [name] was in her usual state of health until this morning when around 1 am she developed 8/10 substernal chest pain. She thought it was reflux, but after taking some medicine with no resolution and worsening of symptoms she decided to come to the ED. Had SOB and nausea along with CP. Once in the ED EKG significant with anterolateral ST elevated. Other labs reviewed: HsTrop 2.5K>24K, lactic acid 2.9>3.2, K 4.2, SCr 1.06, WBC 16.1. She was taken emergency to cath lab and cath showed 99% LAD occlusion and 40% RCA. DES placed in LAD with 0% residual stenosis. After procedure she was tachycardic and hypotensive. AHF team to see for cardiogenic shock, echo reviewed by Dr. Gasper Lloyd, EF 40%. Of note she recently completed 6 wk IV abx course for osteomyelitis.   Comfortable resting in bed, family at bedside. Denies CP/SPB.   Home Medications Prior to Admission medications   Medication Sig Start Date End Date Taking? Authorizing Provider  acetaminophen (TYLENOL) 325 MG tablet Take 2 tablets (650 mg total) by mouth every 6 (six) hours as needed for mild pain (or Fever >/= 101). 07/21/22   [name], [name]  albuterol (VENTOLIN HFA) 108 (90 Base) MCG/ACT inhaler Inhale 2 puffs into the lungs every 6 (six) hours as needed for wheezing or shortness of breath. 04/12/22   Omar Person, MD  amitriptyline (ELAVIL) 50 MG tablet TAKE 1  TABLET BY MOUTH AT BEDTIME FOR SLEEP. 04/12/23   [name], [name]  Calcium Carb-Cholecalciferol (CALCIUM CARBONATE-VITAMIN D3 PO) Take 1 tablet by mouth daily.    [provider]  clotrimazole (ANTIFUNGAL CLOTRIMAZOLE) 1 % cream Apply 1 Application topically 2 (two) times daily. 09/29/22   [name], [name]  DULoxetine (CYMBALTA) 60 MG capsule Take 60 mg by mouth 2 (two) times daily. 02/26/23   [provider]  gabapentin (NEURONTIN) 300 MG capsule Take 1 capsule (300 mg total) by mouth at bedtime. 02/05/23   [name], [name]  hydrochlorothiazide (HYDRODIURIL) 12.5 MG tablet Take 1 tablet (12.5 mg total) by mouth daily for 14 days. Patient not taking: Reported on 04/03/2023 01/31/23 02/14/23  [name], [name]  HYDROcodone-acetaminophen (NORCO/VICODIN) 5-325 MG tablet Take 1 tablet by mouth 2 (two) times daily as needed. Patient not taking: Reported on 02/07/2023 06/22/22   [provider]  levothyroxine (SYNTHROID) 25 MCG tablet Take 1 tablet (25 mcg total) by mouth daily. 02/22/23   [name], [name]  lidocaine (LIDODERM) 5 % Place 1 patch onto the skin daily as needed (for pain). Remove & Discard patch within 12 hours or as directed by MD    [provider]  LINZESS 145 MCG CAPS capsule Take one capsule (145 mcg dose) by mouth daily. Patient not taking: Reported on 04/03/2023 12/15/22   [name], [name]  magnesium oxide (MAG-OX) 400 MG tablet Take 400 mg by mouth daily.    [provider]  metFORMIN (GLUCOPHAGE) 500 MG tablet  Take 1 tablet (500 mg total) by mouth 2 (two) times daily with a meal. 01/09/23   [name], [name]  methocarbamol (ROBAXIN) 500 MG tablet Take 1 tablet (500 mg total) by mouth every 6 (six) hours as needed for muscle spasms. 12/04/22   [name], [name]  MIEBO 1.338 GM/ML SOLN Apply to eye. 12/01/22   [provider]  naloxone [facility]) nasal spray 4 mg/0.1 mL Place 0.4 mg into the nose once. 12/11/19   [provider]  NUCYNTA 50 MG tablet Take 50 mg by mouth 3 (three) times daily.    [provider]  pantoprazole (PROTONIX) 40 MG tablet TAKE 1 TABLET BY MOUTH TWICE DAILY BEFORE A MEAL 04/12/23   Aida Raider, NP  rosuvastatin (CRESTOR) 10 MG tablet TAKE 1 TABLET BY MOUTH AT BEDTIME 04/12/23   [name], [name]  senna (SENOKOT) 8.6 MG TABS tablet Take 2 tablets (17.2 mg total) by mouth 2 (two) times daily. 03/25/21   Jacinta Shoe, PA-C  topiramate (TOPAMAX) 100 MG tablet TAKE (1) TABLET BY MOUTH ONCE DAILY. 04/12/23   [name], [name]  Vitamin D, Ergocalciferol, (DRISDOL) 1.25 MG (50000 UNIT) CAPS capsule Take 1 capsule (50,000 Units total) by mouth every Friday. 11/03/22   [name], [name]    Past Medical History: Past Medical History:  Diagnosis Date   Chronic cough    Chronic pain    Diabetes mellitus without complication (HCC)    Dyspnea    GERD (gastroesophageal reflux disease)    Hypokalemia    Hypothyroidism    Left wrist pain 06/01/2021   Myonecrosis (HCC) 07/01/2021   Neurocardiogenic syncope    OSA (obstructive sleep apnea)    does not use CPAP   Osteomyelitis (HCC)    bilateral feet   Peripheral neuropathy    Presence of intrathecal pump    recieves Prialt/bupivicaine   Tear of gluteus medius tendon 07/01/2021   Wegener's granulomatosis 2009   Wegner's disease (congenital syphilitic osteochondritis)     Past Surgical History: Past Surgical History:  Procedure Laterality Date   AMPUTATION Right 10/21/2020   Procedure: Right hallux amputation;  Surgeon: Toni Arthurs, MD;  Location: [facility];  Service: Orthopedics;  Laterality: Right;   AMPUTATION TOE Bilateral 08/12/2020   Procedure: Right 3rd toe amputation; left hallux and 3rd toe amputation;  Surgeon: Toni Arthurs, MD;  Location: [facility];  Service: Orthopedics;  Laterality: Bilateral;    AMPUTATION TOE Right 03/24/2021   Procedure: AMPUTATION TOE;   Surgeon: Toni Arthurs, MD;  Location: [facility];  Service: Orthopedics;  Laterality: Right;   APPENDECTOMY     BACK SURGERY     BIOPSY  03/30/2022   Procedure: BIOPSY;  Surgeon: Tomma Lightning, MD;  Location: [facility];  Service: Pulmonary;;   BRONCHIAL WASHINGS  03/30/2022   Procedure: BRONCHIAL WASHINGS;  Surgeon: Tomma Lightning, MD;  Location: [facility];  Service: Pulmonary;;   CHOLECYSTECTOMY     COLONOSCOPY N/A 06/02/2013   Procedure: COLONOSCOPY;  Surgeon: Graylin Shiver, MD;  Location: [facility];  Service: Endoscopy;  Laterality: N/A;   HERNIA REPAIR  2000   INCISION AND DRAINAGE ABSCESS Left 07/07/2016   Procedure: DEBRIDMENT LEFT THIGH ABSCESS, EXISION ACUTE SKIN RASH LEFT THIGH(1CM LESION);  Surgeon: Claud Kelp, MD;  Location: [facility];  Service: General;  Laterality: Left;   IR FLUORO GUIDE CV LINE RIGHT  04/24/2022   IR IVC FILTER PLMT / S&I Lenise [name] GUID/MOD  SED  05/17/2022   IR US GUIDE VASC ACCESS RIGHT  04/24/2022   SEPTOPLASTY  02/2013   spleen anuyism     splenic aneurysm     TRANSMETATARSAL AMPUTATION Bilateral 10/21/2020   Procedure: Left transmetatarsal amputation;  Surgeon: Toni Arthurs, MD;  Location: [facility];  Service: Orthopedics;  Laterality: Bilateral;   TRANSMETATARSAL AMPUTATION Left 03/24/2021   Procedure: TRANSMETATARSAL AMPUTATION;  Surgeon: Toni Arthurs, MD;  Location: [facility] OR;  Service: Orthopedics;  Laterality: Left;   VAGINAL HYSTERECTOMY     VIDEO BRONCHOSCOPY Right 03/30/2022   Procedure: VIDEO BRONCHOSCOPY WITH FLUORO;  Surgeon: Tomma Lightning, MD;  Location: [facility];  Service: Pulmonary;  Laterality: Right;  atypical pneumonia    Family History: Family History  Problem Relation Age of Onset   Diabetes Mother    Heart disease Mother    Diabetes Father     Social History: Social History   Socioeconomic History   Marital status: Married    Spouse name: Not on file   Number of children: Not on file   Years of  education: Not on file   Highest education level: Not on file  Occupational History   Not on file  Tobacco Use   Smoking status: Never   Smokeless tobacco: Never  Vaping Use   Vaping status: Never Used  Substance and Sexual Activity   Alcohol use: Yes    Comment: occasionally   Drug use: No   Sexual activity: Yes    Birth control/protection: Surgical  Other Topics Concern   Not on file  Social History Narrative   Not on file   Social Determinants of Health   Financial Resource Strain: Medium Risk (03/23/2023)   [facility]   Overall Financial Resource Strain (CARDIA)    Difficulty of Paying Living Expenses: Somewhat hard  Food Insecurity: No Food Insecurity (05/04/2023)   Hunger Vital Sign    Worried About Running Out of Food in the Last Year: Never true    Ran Out of Food in the Last Year: Never true  Transportation Needs: No Transportation Needs (05/04/2023)   PRAPARE - Administrator, Civil Service (Medical): No    Lack of Transportation (Non-Medical): No  Physical Activity: Inactive (10/29/2021)   Received from [facility]   Exercise Vital Sign    Days of Exercise per Week: 0 days    Minutes of Exercise per Session: 0 min  Stress: Stress Concern Present (10/29/2021)   Received from [facility] - Occupational Stress Questionnaire    Feeling of Stress : To some extent  Social Connections: Unknown (02/19/2022)   Received from [facility]   Social Network    Social Network: Not on file    Allergies:  Allergies  Allergen Reactions   Ibuprofen Other (See Comments)    Pt is unable to take this due to kidney problems.      Penicillins Rash and Other (See Comments)    Has patient had a PCN reaction causing immediate rash, facial/tongue/throat swelling, SOB or lightheadedness with hypotension: No Has patient had a PCN reaction causing severe rash involving mucus membranes or skin  necrosis: No Has patient had a PCN reaction that required hospitalization No Has patient had a PCN reaction occurring within the last 10 years: No If all of the above answers are "NO", then may proceed with Cephalosporin use.    Objective:  Vital Signs:   Temp:  [97.7 F (36.5 C)-98.6 F (37 C)] 97.9 F (36.6 C) (07/12 1147) Pulse Rate:  [0-119] 111 (07/12 1115) Resp:  [16-25] 22 (07/12 1115) BP: (85-117)/(57-93) 94/57 (07/12 1100) SpO2:  [91 %-98 %] 98 % (07/12 1115) Weight:  [99.8 kg] 99.8 kg (07/12 0551) Last BM Date : 05/03/23  Weight change: Filed Weights   05/04/23 0551  Weight: 99.8 kg   Intake/Output:   Intake/Output Summary (Last 24 hours) at 05/04/2023 1225 Last data filed at 05/04/2023 1100 Gross per 24 hour  Intake 48.1 ml  Output --  Net 48.1 ml    Physical Exam  General:  well appearing.  No respiratory difficulty HEENT: normal Neck: supple. JVD ~8 cm. Carotids 2+ bilat; no bruits. No lymphadenopathy or thyromegaly appreciated. Cor: PMI nondisplaced. Tachy rate & reg rhythm. No rubs, gallops or murmurs. Lungs: clear Abdomen: soft, nontender, nondistended. No hepatosplenomegaly. No bruits or masses. Good bowel sounds. Extremities: no cyanosis, clubbing, rash, +1 BLE edema. Bilateral metatarsal amputations Neuro: alert & oriented x 3, cranial nerves grossly intact. moves all 4 extremities w/o difficulty. Affect pleasant.   Telemetry   ST low 100s (Personally reviewed)    EKG    ST 110s   Labs   Basic Metabolic Panel: Recent Labs  Lab 05/04/23 0612  NA 137  K 4.2  CL 103  CO2 19*  GLUCOSE 269*  BUN 13  CREATININE 1.06*  CALCIUM 9.3    Liver Function Tests: Recent Labs  Lab 05/04/23 0612  AST 31  ALT 21  ALKPHOS 137*  BILITOT 0.4  PROT 6.3*  ALBUMIN 3.5   No results for input(s): "LIPASE", "AMYLASE" in the last 168 hours. No results for input(s): "AMMONIA" in the last 168 hours.  CBC: Recent Labs  Lab 05/04/23 0612  WBC  16.1*  NEUTROABS 8.4*  HGB 13.1  HCT 41.2  MCV 90.7  PLT 292    Cardiac Enzymes: No results for input(s): "CKTOTAL", "CKMB", "CKMBINDEX", "TROPONINI" in the last 168 hours.  BNP: BNP (last 3 results) Recent Labs    05/04/23 0620  BNP 123.2*   ProBNP (last 3 results) No results for input(s): "PROBNP" in the last 8760 hours.  CBG: Recent Labs  Lab 05/02/23 1326 05/03/23 0947 05/03/23 1237 05/04/23 0906 05/04/23 1146  GLUCAP 214* 205* 208* 254* 215*   Coagulation Studies: Recent Labs    05/04/23 0612  LABPROT 13.4  INR 1.0   Imaging   CARDIAC CATHETERIZATION  Result Date: 05/04/2023   Prox RCA lesion is 40% stenosed.   Prox LAD to Mid LAD lesion is 99% stenosed.   A drug-eluting stent was successfully placed using a STENT SYNERGY XD 3.0X32.   Post intervention, there is a 0% residual stenosis.   LV end diastolic pressure is moderately elevated.   Recommend uninterrupted dual antiplatelet therapy with Aspirin 81mg  daily and Ticagrelor 90mg  twice daily for a minimum of 12 months (ACS-Class I recommendation). 1.  Severe single-vessel disease with subtotal occlusion of the proximal LAD, 99% stenosis reduced to 0% with PTCA and stenting using a 3.0 x 32 mm Synergy DES.  TIMI I flow at baseline, TIMI-3 flow post procedure. 2.  Patent left main, left circumflex, and dominant RCA with mild nonobstructive plaquing in those vessels 3.  Moderately elevated LVEDP of 22 mmHg 4.  Poorly visualized LV function by ventriculography, but basal to mid segments appear hyperdynamic, possible hypokinesis of the distal anterior wall and apex.  Unable to  estimate LVEF.  2D echo to be done for further assessment Recommendations: CV-ICU care.  DAPT with aspirin and ticagrelor x 12 months.  Aggressive post MI medical therapy. Needs close evaluation, consider diuresis. LVEDP 24 mmHg, lactate 2.9, SBP > 90 on no support, but narrow pulse pressure.   DG Chest Portable 1 View  Result Date:  05/04/2023 CLINICAL DATA:  57 year old female with chest pain, possible myocardial infarction. EXAM: PORTABLE CHEST 1 VIEW COMPARISON:  Portable chest 03/20/2023. FINDINGS: Portable AP semi upright view at 0607 hours. Similar low lung volumes and mediastinal contours, but diffusely indistinct pulmonary vasculature now. No pneumothorax, pleural effusion or consolidation. Visualized tracheal air column is within normal limits. No acute osseous abnormality identified. Paucity of bowel gas in the visible abdomen. IMPRESSION: Acute Pulmonary Edema.  Low lung volumes stable compared to May. Electronically Signed   By: Odessa Fleming M.D.   On: 05/04/2023 06:26    Medications:    Current Medications:  [START ON 05/05/2023] aspirin  81 mg Oral Daily   [START ON 05/05/2023] enoxaparin (LOVENOX) injection  40 mg Subcutaneous Q24H   gabapentin  300 mg Oral QHS   insulin aspart  0-15 Units Subcutaneous TID WC   insulin aspart  0-5 Units Subcutaneous QHS   levothyroxine  25 mcg Oral Daily   linaclotide  145 mcg Oral QAC breakfast   magnesium oxide  400 mg Oral Daily   nitroGLYCERIN       pantoprazole  40 mg Oral BID AC   rosuvastatin  40 mg Oral Daily   senna  2 tablet Oral BID   sodium chloride flush  3 mL Intravenous Q12H   ticagrelor  90 mg Oral BID    Infusions:  sodium chloride 20 mL/hr at 05/04/23 0631   sodium chloride 10 mL/hr at 05/04/23 1100    Patient Profile   [name] is a 57 y.o. 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 Admitted with STEMI s/p PDI to LAD now in cardiogenic shock.   Assessment/Plan  Cardiogenic shock  - SCAI stage C, lactic acid 3.2 - Echo 6/23 EF 55%, no RWMA, normal RV, trivial MR - Echo today,  EF 40% on Dr. Gasper Lloyd review - Place PICC, trend lactic acid, co-ox and CVP - hypotensive post PCI, start milrinone 0.125, will order vaso 0.02 if needed for BP  support  - avoid levo with tachycardia - volume mildly elevated, give 40 IV lasix x1 - strict I&O, daily weights  Anterior STEMI, CAD - HsTrop >24K on admission - s/p PCI to LAD 7/12, post DES 0% residual stenosis. Medical management of 40% stenosed RCA.  - plan for DAPT with ASA and Ticagrelor x12 months - Continue statin, LDL unable to be calculated with high TGD - denies CP  DM II - A1c last 7.6 3/24, update - SSI while admitted per primary - has diabetic foot ulcer, WOC following  Obesity - Body mass index is 37.76 kg/m.  - consider GLP-1 OP  OSA - does not use CPAP  HLD - Continue statin, need repeat lipid panel ~4 months   Length of Stay: 0  Alen Bleacher, NP  05/04/2023, 12:25 PM  Advanced Heart Failure Team Pager [phone] (M-F; 7a - 5p)  Please contact [facility] for night-coverage after hours (4p -7a ) and weekends on [email or web address]

## 2023-05-04 NOTE — Progress Notes (Signed)
Echocardiogram 2D Echocardiogram has been performed.  [name] 05/04/2023, 12:27 PM

## 2023-05-04 NOTE — ED Notes (Signed)
Patient transferred with cath lab staff to cath lab. Report given in cath lab to staff assuming care.

## 2023-05-04 NOTE — Inpatient Diabetes Management (Signed)
Inpatient Diabetes Program Recommendations  AACE/ADA: New Consensus Statement on Inpatient Glycemic Control (2015)  Target Ranges:  Prepandial:   less than 140 mg/dL      Peak postprandial:   less than 180 mg/dL (1-2 hours)      Critically ill patients:  140 - 180 mg/dL    Latest Reference Range & Units 05/04/23 09:06  Glucose-Capillary 70 - 99 mg/dL 578 (H)  (H): Data is abnormally high    Home DM Meds: Metformin 500 mg BID   Current DM Orders: None yet    Current A1c Pending (Last A1c was 7.5% June 2024 at PCP office visit)    MD- Please consider starting Novolog Moderate Correction Scale/ SSI (0-15 units) TID AC + HS   --Will follow patient during hospitalization--  Ambrose Finland RN, MSN, CDCES Diabetes Coordinator Inpatient Glycemic Control Team Team Pager: [phone] (8a-5p)

## 2023-05-04 NOTE — ED Provider Notes (Signed)
[place] [facility]PARTMENT AT [facility] Provider Note   CSN: [id number] Arrival date & time: 05/04/23  0537     History  Chief Complaint  Patient presents with   Chest Pain    [name] is a 57 y.o. female.  57 year old female that presents to the ER secondary to chest pain.  Patient states that she has been having some chest pressure for the last 4 hours or so.  Associated with nausea, lightheadedness, dyspnea and diaphoresis.  She thought might be heartburn so she took Tums which did not seem to help at all.  She has a history of diabetes, hypertension, hyperlipidemia, Wegener's granulomatosis.  No cardiac history as far she knows.  She took on a 2 of aspirin at home which did not help her pain either.  She does see Dr. [place] with cardiology but this is related to blood pressure.   Chest Pain      Home Medications Prior to Admission medications   Medication Sig Start Date End Date Taking? Authorizing Provider  acetaminophen (TYLENOL) 325 MG tablet Take 2 tablets (650 mg total) by mouth every 6 (six) hours as needed for mild pain (or Fever >/= 101). 07/21/22   [name], [name]  albuterol (VENTOLIN HFA) 108 (90 Base) MCG/ACT inhaler Inhale 2 puffs into the lungs every 6 (six) hours as needed for wheezing or shortness of breath. 04/12/22   Omar Person, MD  amitriptyline (ELAVIL) 50 MG tablet TAKE 1 TABLET BY MOUTH AT BEDTIME FOR SLEEP. 04/12/23   [name], [name]  Calcium Carb-Cholecalciferol (CALCIUM CARBONATE-VITAMIN D3 PO) Take 1 tablet by mouth daily.    [provider]  clotrimazole (ANTIFUNGAL CLOTRIMAZOLE) 1 % cream Apply 1 Application topically 2 (two) times daily. 09/29/22   [name], [name]  gabapentin (NEURONTIN) 300 MG capsule Take 1 capsule (300 mg total) by mouth at bedtime. 02/05/23   [name], [name]  hydrochlorothiazide (HYDRODIURIL) 12.5 MG tablet Take 1 tablet (12.5 mg total) by mouth daily for 14 days. Patient not  taking: Reported on 04/03/2023 01/31/23 02/14/23  [name], [name]  HYDROcodone-acetaminophen (NORCO/VICODIN) 5-325 MG tablet Take 1 tablet by mouth 2 (two) times daily as needed. Patient not taking: Reported on 02/07/2023 06/22/22   [provider]  levothyroxine (SYNTHROID) 25 MCG tablet Take 1 tablet (25 mcg total) by mouth daily. 02/22/23   [name], [name]  lidocaine (LIDODERM) 5 % Place 1 patch onto the skin daily as needed (for pain). Remove & Discard patch within 12 hours or as directed by MD    [provider]  LINZESS 145 MCG CAPS capsule Take one capsule (145 mcg dose) by mouth daily. Patient not taking: Reported on 04/03/2023 12/15/22   [name], [name]  magnesium oxide (MAG-OX) 400 MG tablet Take 400 mg by mouth daily.    [provider]  metFORMIN (GLUCOPHAGE) 500 MG tablet Take 1 tablet (500 mg total) by mouth 2 (two) times daily with a meal. 01/09/23   [name], [name]  methocarbamol (ROBAXIN) 500 MG tablet Take 1 tablet (500 mg total) by mouth every 6 (six) hours as needed for muscle spasms. 12/04/22   [name], [name]  MIEBO 1.338 GM/ML SOLN Apply to eye. 12/01/22   [provider]  naloxone [facility]) nasal spray 4 mg/0.1 mL Place 0.4 mg into the nose once. 12/11/19   [provider]  pantoprazole (PROTONIX) 40 MG tablet TAKE 1 TABLET BY MOUTH TWICE DAILY BEFORE A MEAL 04/12/23  Aida Raider, NP  rosuvastatin (CRESTOR) 10 MG tablet TAKE 1 TABLET BY MOUTH AT BEDTIME 04/12/23   [name], [name]  senna (SENOKOT) 8.6 MG TABS tablet Take 2 tablets (17.2 mg total) by mouth 2 (two) times daily. 03/25/21   Jacinta Shoe, PA-C  topiramate (TOPAMAX) 100 MG tablet TAKE (1) TABLET BY MOUTH ONCE DAILY. 04/12/23   [name], [name]  Vitamin D, Ergocalciferol, (DRISDOL) 1.25 MG (50000 UNIT) CAPS capsule Take 1 capsule (50,000 Units total) by mouth every Friday. 11/03/22   [name], [name]      Allergies    Ibuprofen and  Penicillins    Review of Systems   Review of Systems  Cardiovascular:  Positive for chest pain.    Physical Exam Updated Vital Signs BP 117/62   Pulse (!) 109   Temp 98.6 F (37 C) (Oral)   Resp (!) 23   Ht 5\' 4"  (1.626 m)   Wt 99.8 kg   SpO2 97%   BMI 37.76 kg/m  Physical Exam Vitals and nursing note reviewed.  Constitutional:      Appearance: She is well-developed.  HENT:     Head: Normocephalic and atraumatic.  Cardiovascular:     Rate and Rhythm: Normal rate and regular rhythm.  Pulmonary:     Effort: No respiratory distress.     Breath sounds: No stridor.  Abdominal:     General: There is no distension.  Musculoskeletal:     Cervical back: Normal range of motion.  Skin:    General: Skin is moist.     Coloration: Skin is pale.  Neurological:     Mental Status: She is alert.     ED Results / Procedures / Treatments   Labs (all labs ordered are listed, but only abnormal results are displayed) Labs Reviewed  CBC  COMPREHENSIVE METABOLIC PANEL  HEMOGLOBIN A1C  CBC WITH DIFFERENTIAL/PLATELET  PROTIME-INR  APTT  LIPID PANEL  I-STAT CG4 LACTIC ACID, ED  TROPONIN I (HIGH SENSITIVITY)    EKG EKG Interpretation Date/Time:  Friday May 04 2023 05:45:58 EDT Ventricular Rate:  113 PR Interval:  165 QRS Duration:  92 QT Interval:  326 QTC Calculation: 447 R Axis:   -51  Text Interpretation: Sinus tachycardia Inferior infarct, acute Probable anterolateral infarct, acute Baseline wander in lead(s) III aVL >>> Acute MI <<< Confirmed by Marily Memos [phone]) on 05/04/2023 6:16:32 AM  Radiology No results found.  Procedures .Critical Care  Performed by: Marily Memos, MD Authorized by: Marily Memos, MD   Critical care provider statement:    Critical care time (minutes):  30   Critical care was necessary to treat or prevent imminent or life-threatening deterioration of the following conditions:  Cardiac failure and circulatory failure   Critical care  was time spent personally by me on the following activities:  Development of treatment plan with patient or surrogate, discussions with consultants, evaluation of patient's response to treatment, examination of patient, ordering and review of laboratory studies, ordering and review of radiographic studies, ordering and performing treatments and interventions, pulse oximetry, re-evaluation of patient's condition and review of old charts     Medications Ordered in ED Medications  aspirin chewable tablet 162 mg (has no administration in time range)  0.9 %  sodium chloride infusion (has no administration in time range)  heparin injection 4,000 Units (has no administration in time range)    ED Course/ Medical Decision Making/ A&P  Medical Decision Making Amount and/or Complexity of Data Reviewed Labs: ordered. Radiology: ordered.  Risk OTC drugs. Prescription drug management. Decision regarding hospitalization.  Saw ECG on monitor at 0605, appears c/w STEMI without old ones to compare. Patient states she has substernal cp, nausea, diaphoresis, light headedness and dyspnea unlike anything she has had before since 0100. Tried tums and didn't work so came here for further eval. Lungs diminished bilaterally. Skin wet. Color pale. Will activate STEMI. Already had 162 ASA at home, will give another 162.  D/w Dr. Excell Seltzer regarding symptoms and ecg, will take to cath lab.  Heparin given, to the cath lab, no improvement in symptoms, BP soft, will hold on NTG, already had a dose of fentanyl, suspect that revascularization will help symptoms.    Final Clinical Impression(s) / ED Diagnoses Final diagnoses:  ST elevation myocardial infarction involving left anterior descending (LAD) coronary artery [facility])    Rx / DC Orders ED Discharge Orders     None         Johnie Makki, Barbara Cower, MD 05/09/23 [phone]

## 2023-05-04 NOTE — Progress Notes (Signed)
Date and time results received: 05/04/23 0940   Test:: Lactic Acid Critical Value: 3.2  Name of Provider Notified: Dr. Allyson Sabal  Orders Received? Or Actions Taken?:  Order received to repeat Lactic Acid in 6 hours (1400)

## 2023-05-04 NOTE — Progress Notes (Signed)
RN at bedside staff she was able to establish 2 PIV access points. Pt going to cath lab

## 2023-05-04 NOTE — Progress Notes (Signed)
[place], [name] ([id number]) [id number] Page 1 of 2 Visit Report for 05/03/2023 Problem List Details Patient Name: Date of Service: [name], SPISAK 05/03/2023 10:00 A M Medical Record Number: [id number] Patient Account Number: [id number] Date of Birth/Sex: Treating RN: 1966/02/01 (57 y.o. 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) Z89.422 Acquired absence of  other left toe(s) Facility Procedures : CPT4 Code Description: [id number]-(Facility Use Only) HBOT full body chamber, , ICD-10 Diagnosis Description 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

## 2023-05-04 NOTE — TOC Benefit Eligibility Note (Signed)
Pharmacy Patient Advocate Encounter  Insurance verification completed.    The patient is insured through [employer] [facility]   Ran test claim for Brilinta 90 mg and the current 30 day co-pay is $0.00.   This test claim was processed through [facility]- copay amounts may vary at other pharmacies due to pharmacy/plan contracts, or as the patient moves through the different stages of their insurance plan.    Roland Earl, CPHT Pharmacy Patient Advocate Specialist [facility] Patient Advocate Team Direct Number: [phone]  Fax: [phone]

## 2023-05-04 NOTE — ED Triage Notes (Signed)
Patient reports central chest pain radiating to left arm with SOB . Nausea and diaphoresis onset 12 MN , her cardiologist is Dr. Demetrius Charity. [place].
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
## 2023-05-04 NOTE — TOC Initial Note (Signed)
Transition of Care [facility]) - Initial/Assessment Note    Patient Details  Name: [name] MRN: [id number] Date of Birth: 20-Jan-1966  Transition of Care [facility]) CM/SW Contact:    Gala Lewandowsky, RN Phone Number: 05/04/2023, 1:44 PM  Clinical Narrative: Patient presented for chest pain- Stemi-post LHC. Benefits check submitted for [employer] and co pay is zero dollars. Patient has insurance and PCP. Case Manager was able to speak with spouse and pta, spouse states patient was pretty independent from home and she was able to stay by herself during the day while he worked. Spouse states he and the patients father alternate transportation to physician appointments. Patient has DME: motorized scooter, bedside commode and built in shower bench in the bathroom. Case Manager will continue to follow for additional transition of care needs as the patient progresses.                   Expected Discharge Plan: Home w Home Health Services Barriers to Discharge: Continued Medical Work up  Expected Discharge Plan and Services In-house Referral: NA Discharge Planning Services: CM Consult Post Acute Care Choice: Home Health Living arrangements for the past 2 months: Single Family Home                 DME Arranged: N/A  Prior Living Arrangements/Services Living arrangements for the past 2 months: Single Family Home Lives with:: Spouse Patient language and need for interpreter reviewed:: Yes Do you feel safe going back to the place where you live?: Yes      Need for Family Participation in Patient Care: Yes (Comment) Care giver support system in place?: Yes (comment) Current home services: DME (motorized scooter, bedside commode, and buit in bench for bathroom.) Criminal Activity/Legal Involvement Pertinent to Current Situation/Hospitalization: No - Comment as needed  Activities of Daily Living Home Assistive Devices/Equipment: Wheelchair, Blood pressure cuff, CBG Meter ADL Screening  (condition at time of admission) Patient's cognitive ability adequate to safely complete daily activities?: Yes Is the patient deaf or have difficulty hearing?: No Does the patient have difficulty seeing, even when wearing glasses/contacts?: No Does the patient have difficulty concentrating, remembering, or making decisions?: No Patient able to express need for assistance with ADLs?: Yes Does the patient have difficulty dressing or bathing?: No Independently performs ADLs?: Yes (appropriate for developmental age) Does the patient have difficulty walking or climbing stairs?: No Weakness of Legs: None Weakness of Arms/Hands: None  Permission Sought/Granted Permission sought to share information with : Family Supports, Case Manager     Alcohol / Substance Use: Not Applicable Psych Involvement: No (comment)  Admission diagnosis:  STEMI involving left anterior descending coronary artery (HCC) [I21.02] Patient Active Problem List   Diagnosis Date Noted   STEMI involving left anterior descending coronary artery (HCC) 05/04/2023   Diabetic ulcer of right foot associated with type 2 diabetes mellitus, with necrosis of muscle (HCC) 04/04/2023   Cervical cancer screening 02/05/2023   Odynophagia 01/31/2023   Edema, peripheral 01/31/2023   Sleep disturbance 01/02/2023   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) 12/13/2022   Tinea of groin 09/30/2022   Need for immunization against influenza 09/30/2022   Aortic atherosclerosis (HCC) 06/29/2022   Type 2 diabetes mellitus (HCC) 06/29/2022   Acute deep vein thrombosis (DVT) of left lower extremity (HCC) 05/17/2022   Rhabdomyolysis 04/18/2022   Transaminitis 04/17/2022   Recurrent pneumonia 04/12/2022   Pulmonary embolism (HCC) 03/28/2022   Severe sepsis (HCC) 03/26/2022   UTI (urinary tract infection)  03/26/2022   Opioid dependence (HCC) 03/26/2022   Acute metabolic encephalopathy 03/26/2022   Hip pain 02/20/2022   Arthritis of right  hip 02/19/2022   Hypothyroidism    AKI (acute kidney injury) (HCC)    Class 2 obesity    Depression    Essential hypertension    Acute cystitis without hematuria 01/04/2022   Dysfunction of left rotator cuff 01/04/2022   Lobar pneumonia (HCC) 01/04/2022   Acquired absence of other toe(s), unspecified side (HCC) 08/01/2021   Myositis of right lower extremity 07/01/2021   Tear of gluteus medius tendon 07/01/2021   Left wrist pain 06/01/2021   Medication monitoring encounter 05/10/2021   Chronic pain 03/24/2021   COVID-19 virus infection 11/01/2020   Stage 3a chronic kidney disease (HCC) 09/08/2019   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   Steroid-induced hyperglycemia 05/11/2018   Morbid obesity (HCC) 01/25/2018   Neuropathy 01/25/2018   Presence of intrathecal pump 08/02/2017   Rectocele 03/09/2017   Abnormal finding on imaging 12/19/2016   Esophageal dysphagia 12/19/2016   Current chronic use of systemic steroids 10/26/2016   IFG (impaired fasting glucose) 10/26/2016   Arthralgia of multiple joints 07/15/2016   Headache 07/15/2016   Peripheral neuropathy 07/04/2016   Rash of body 07/04/2016   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   Neuropathic pain of both feet 01/11/2016   OSA (obstructive sleep apnea) 12/29/2015   Chronic insomnia 12/29/2015   Obesity, Class II, BMI 35-39.9, with comorbidity 12/29/2015   Polyneuropathy associated with underlying disease (HCC) 12/29/2015   Class 2 drug-induced obesity with serious comorbidity and body mass index (BMI) of 35.0 to 35.9 in adult 12/29/2015   Other mechanical complication of implanted electronic neurostimulator of spinal cord electrode (lead), initial encounter (HCC) 08/04/2015   Adjustment disorder with depressed mood 03/29/2015   Mononeuritis 07/20/2014   Obesity (BMI 30-39.9) 07/20/2014   Neurocardiogenic syncope 07/06/2014   Normocytic anemia 06/08/2014   POTS (postural orthostatic tachycardia  syndrome) 06/08/2014   Encounter for long-term (current) use of high-risk medication 05/30/2013   Fall 05/30/2013   Other long term (current) drug therapy 05/30/2013   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 - [address] [place] [address] Phone: [phone] Fax: [phone]  Social Determinants of Health (SDOH) Social History: SDOH Screenings   Food Insecurity: No Food Insecurity (05/04/2023)  Housing: Patient Declined (05/04/2023)  Transportation Needs: No Transportation Needs (05/04/2023)  Utilities: Not At Risk (05/04/2023)  Depression (PHQ2-9): Low Risk  (04/03/2023)  Recent Concern: Depression (PHQ2-9) - Medium Risk (02/05/2023)  Financial Resource Strain: Medium Risk (03/23/2023)   [facility]  Physical Activity: Inactive (10/29/2021)   Received from [facility]  Social Connections: Unknown (02/19/2022)   Received from [facility]  Stress: Stress Concern Present (10/29/2021)   Received from [facility]  Tobacco Use: Low Risk  (05/04/2023)   Readmission Risk Interventions    04/18/2022    4:11 PM 04/05/2022  2:33 PM 03/27/2022   10:12 AM  Readmission Risk Prevention Plan  Transportation Screening Complete Complete Complete  Medication Review Oceanographer) Complete Complete Complete  PCP or Specialist  appointment within 3-5 days of discharge Complete Complete   HRI or Home Care Consult Complete Complete Complete  SW Recovery Care/Counseling Consult Complete Complete Complete  Palliative Care Screening Not Applicable Not Applicable Not Applicable  Skilled Nursing Facility Not Applicable Not Applicable Not Applicable

## 2023-05-04 NOTE — ED Notes (Signed)
Stemi paged  

## 2023-05-04 NOTE — Progress Notes (Signed)
Peripherally Inserted Central Catheter Placement  The IV Nurse has discussed with the patient and/or persons authorized to consent for the patient, the purpose of this procedure and the potential benefits and risks involved with this procedure.  The benefits include less needle sticks, lab draws from the catheter, and the patient may be discharged home with the catheter. Risks include, but not limited to, infection, bleeding, blood clot (thrombus formation), and puncture of an artery; nerve damage and irregular heartbeat and possibility to perform a PICC exchange if needed/ordered by physician.  Alternatives to this procedure were also discussed.  Bard Power PICC patient education guide, fact sheet on infection prevention and patient information card has been provided to patient /or left at bedside.    PICC Placement Documentation  PICC Triple Lumen 05/04/23 Left Basilic 43 cm 0 cm (Active)  Indication for Insertion or Continuance of Line Vasoactive infusions 05/04/23 1600  Exposed Catheter (cm) 0 cm 05/04/23 1600  Site Assessment Clean, Dry, Intact 05/04/23 1600  Lumen #1 Status Flushed;Saline locked;Blood return noted 05/04/23 1600  Lumen #2 Status Flushed;Saline locked;Blood return noted 05/04/23 1600  Lumen #3 Status Flushed;Saline locked;Blood return noted 05/04/23 1600  Dressing Type Transparent;Securing device 05/04/23 1600  Dressing Status Antimicrobial disc in place 05/04/23 1600  Safety Lock Not Applicable 05/04/23 1600  Line Care Connections checked and tightened 05/04/23 1600  Line Adjustment (NICU/IV Team Only) No 05/04/23 1600  Dressing Intervention New dressing 05/04/23 1600  Dressing Change Due 05/11/23 05/04/23 1600       Vernona Rieger  Hakan Nudelman 05/04/2023, 4:12 PM

## 2023-05-04 NOTE — Consult Note (Addendum)
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 per ortho notes and [facility]; is [facility], last note from their office 04/16/2023 states dry dressing however husband states he has been putting Hydofera Blue on this area and has noticed an area of tunneling as well  Reason for Consult: chronic R foot wound  Wound type: full thickness diabetic ulcer R stump  Pressure Injury POA: NA Measurement: 1 cm x 1 cm x 0.5 cm with 1.5 cm tunneling noted at 3 o'clock Wound bed: 100% red and moist  Drainage (amount, consistency, odor) minimal serosanguinous  Periwound: healed surgical incision  Dressing procedure/placement/frequency: Clean R foot ulcer with NS, cut a thin strip of silver hydrofiber [place] [phone]), using Q tip applicator insert silver into tunnel with tail extending out onto wound bed.  Cover silver with foam dressing to secure.  May change every other day.  SOAK SILVER WITH NS IF STUCK TO WOUND BED FOR ATRAUMATIC REMOVAL.   Hospital does not stock Hydrofera Blue, silver hydofiber suitable substitute.  Patient will resume wound care as ordered by [facility] at discharge.    Patient is noted to have multiple scattered lesions on bilateral legs that patients husband states have been seen by multiple dermatologists and no one can tell them what causes these.  Largest area of partial thickness skin loss L medial foot (above heel) measuring 2 cm x 2 cm 100% pink and moist; anterior L foot 0.8 cm x 0.8 cm x 0.1 cm full thickness 100% pink and moist; L lateral foot 1 cm x 2 cm partial thickness 100% pink and moist; some of these areas are noted to be crusted over and dry.  Some scarring noted from prior healed lesions. Place silicone foam on the areas that are open to protect them.  No further wound care needed for these areas.    POC discussed  with patient, spouse and bedside nurse.  WOC team will not follow.  Re-consult if further needs arise.   Thank you,     Priscella Mann MSN, RN-BC, [employer] [phone]

## 2023-05-05 DIAGNOSIS — I2102 ST elevation (STEMI) myocardial infarction involving left anterior descending coronary artery: Secondary | ICD-10-CM | POA: Diagnosis not present

## 2023-05-05 DIAGNOSIS — I2109 ST elevation (STEMI) myocardial infarction involving other coronary artery of anterior wall: Secondary | ICD-10-CM | POA: Diagnosis not present

## 2023-05-05 DIAGNOSIS — I5023 Acute on chronic systolic (congestive) heart failure: Secondary | ICD-10-CM | POA: Diagnosis not present

## 2023-05-05 DIAGNOSIS — Z1152 Encounter for screening for COVID-19: Secondary | ICD-10-CM | POA: Diagnosis not present

## 2023-05-05 DIAGNOSIS — J9601 Acute respiratory failure with hypoxia: Secondary | ICD-10-CM | POA: Diagnosis not present

## 2023-05-05 LAB — BASIC METABOLIC PANEL
Anion gap: 11 (ref 5–15)
BUN: 19 mg/dL (ref 6–20)
CO2: 19 mmol/L — ABNORMAL LOW (ref 22–32)
Calcium: 8.7 mg/dL — ABNORMAL LOW (ref 8.9–10.3)
Chloride: 103 mmol/L (ref 98–111)
Creatinine, Ser: 1.35 mg/dL — ABNORMAL HIGH (ref 0.44–1.00)
GFR, Estimated: 46 mL/min — ABNORMAL LOW (ref >60)
Glucose, Bld: 252 mg/dL — ABNORMAL HIGH (ref 70–99)
Potassium: 3.9 mmol/L (ref 3.5–5.1)
Sodium: 133 mmol/L — ABNORMAL LOW (ref 135–145)

## 2023-05-05 LAB — GLUCOSE, CAPILLARY
Glucose-Capillary: 231 mg/dL — ABNORMAL HIGH (ref 70–99)
Glucose-Capillary: 219 mg/dL — ABNORMAL HIGH (ref 70–99)
Glucose-Capillary: 277 mg/dL — ABNORMAL HIGH (ref 70–99)
Glucose-Capillary: 244 mg/dL — ABNORMAL HIGH (ref 70–99)
Glucose-Capillary: 233 mg/dL — ABNORMAL HIGH (ref 70–99)
Glucose-Capillary: 224 mg/dL — ABNORMAL HIGH (ref 70–99)

## 2023-05-05 LAB — COOXEMETRY PANEL
Carboxyhemoglobin: 1.4 % (ref 0.5–1.5)
Carboxyhemoglobin: 1 % (ref 0.5–1.5)
Methemoglobin: <0.7 % (ref 0.0–1.5)
Methemoglobin: 0.9 % (ref 0.0–1.5)
O2 Saturation: 53.2 %
O2 Saturation: 55.7 %
Total hemoglobin: 11.8 g/dL — ABNORMAL LOW (ref 12.0–16.0)
Total hemoglobin: 11.5 g/dL — ABNORMAL LOW (ref 12.0–16.0)

## 2023-05-05 LAB — CBC
HCT: 36.4 % (ref 36.0–46.0)
Hemoglobin: 11.3 g/dL — ABNORMAL LOW (ref 12.0–15.0)
MCH: 27.8 pg (ref 26.0–34.0)
MCHC: 31 g/dL (ref 30.0–36.0)
MCV: 89.4 fL (ref 80.0–100.0)
Platelets: 267 10*3/uL (ref 150–400)
RBC: 4.07 MIL/uL (ref 3.87–5.11)
RDW: 14.1 % (ref 11.5–15.5)
WBC: 17.6 10*3/uL — ABNORMAL HIGH (ref 4.0–10.5)
nRBC: 0 % (ref 0.0–0.2)

## 2023-05-05 LAB — LACTIC ACID, PLASMA: Lactic Acid, Venous: 2.8 mmol/L (ref 0.5–1.9)

## 2023-05-05 LAB — TROPONIN I (HIGH SENSITIVITY): Troponin I (High Sensitivity): >24000 ng/L (ref <18)

## 2023-05-05 LAB — HEMOGLOBIN A1C
Hgb A1c MFr Bld: 7.6 % — ABNORMAL HIGH (ref 4.8–5.6)
Mean Plasma Glucose: 171 mg/dL

## 2023-05-05 LAB — MAGNESIUM: Magnesium: 1.8 mg/dL (ref 1.7–2.4)

## 2023-05-05 MED ORDER — TAPENTADOL HCL 50 MG PO TABS
50.0000 mg | ORAL_TABLET | Freq: Three times a day (TID) | ORAL | Status: DC
  Administered 2023-05-05 – 2023-05-10 (×16): 50 mg via ORAL
  Filled 2023-05-05 (×17): qty 1

## 2023-05-05 MED ORDER — DIGOXIN 125 MCG PO TABS
0.1250 mg | ORAL_TABLET | Freq: Every day | ORAL | Status: DC
  Administered 2023-05-05 – 2023-05-09 (×5): 0.125 mg via ORAL
  Filled 2023-05-05 (×5): qty 1

## 2023-05-05 MED ORDER — FUROSEMIDE 10 MG/ML IJ SOLN
40.0000 mg | Freq: Once | INTRAMUSCULAR | Status: AC
  Administered 2023-05-05: 40 mg via INTRAVENOUS
  Filled 2023-05-05: qty 4

## 2023-05-05 MED ORDER — MAGNESIUM SULFATE 2 GM/50ML IV SOLN
2.0000 g | Freq: Once | INTRAVENOUS | Status: AC
  Administered 2023-05-05: 2 g via INTRAVENOUS
  Filled 2023-05-05: qty 50

## 2023-05-05 MED ORDER — POTASSIUM CHLORIDE CRYS ER 20 MEQ PO TBCR
20.0000 meq | EXTENDED_RELEASE_TABLET | Freq: Once | ORAL | Status: AC
  Administered 2023-05-05: 20 meq via ORAL
  Filled 2023-05-05: qty 1

## 2023-05-05 MED ORDER — ASPIRIN 325 MG PO TABS
325.0000 mg | ORAL_TABLET | Freq: Two times a day (BID) | ORAL | Status: DC
  Administered 2023-05-05 – 2023-05-06 (×3): 325 mg via ORAL
  Filled 2023-05-05 (×3): qty 1

## 2023-05-05 MED ORDER — COLCHICINE 0.6 MG PO TABS
0.6000 mg | ORAL_TABLET | Freq: Two times a day (BID) | ORAL | Status: DC
  Administered 2023-05-05 – 2023-05-06 (×3): 0.6 mg via ORAL
  Filled 2023-05-05 (×3): qty 1

## 2023-05-05 MED ORDER — TAPENTADOL HCL 50 MG PO TABS: 50.0000 mg | ORAL_TABLET | Freq: Three times a day (TID) | ORAL | Status: DC

## 2023-05-05 NOTE — Progress Notes (Signed)
Transferred to bedside commode and she had a large stool. HR 122. Increased dyspnea and shortness of breath.Transferred back to bed without difficulty but continues to be SOB with inspiratory and expiratory wheezing noted and coarse breath sounds. Requested albuterol neb by RT with noted improvement.

## 2023-05-05 NOTE — Progress Notes (Addendum)
Advanced Heart Failure Rounding Note   Subjective:    Continues to have CP. Rates it as 6/10. Pain never resolved after PCI. (Not worse than post PCI) Worse with deep breathing and sitting up.   Feels weak.   Co-ox 53% on milrinone 0.125. CVP 11. HR remains 110-120. SBP 90s.   ECG with persistent ST elevation    Objective:   Weight Range:  Vital Signs:   Temp:  [97.9 F (36.6 C)] 97.9 F (36.6 C) (07/13 0730) Pulse Rate:  [109-146] 113 (07/13 0900) Resp:  [12-38] 29 (07/13 0900) BP: (81-120)/(48-96) 120/96 (07/13 0900) SpO2:  [85 %-100 %] 96 % (07/13 0900) Weight:  [103.8 kg] 103.8 kg (07/13 0500) Last BM Date : 05/04/23  Weight change: Filed Weights   05/04/23 0551 05/05/23 0500  Weight: 99.8 kg 103.8 kg    Intake/Output:   Intake/Output Summary (Last 24 hours) at 05/05/2023 0953 Last data filed at 05/05/2023 0600 Gross per 24 hour  Intake 141.59 ml  Output 350 ml  Net -208.41 ml     Physical Exam: General:  Weak appearing. No resp difficulty HEENT: normal Neck: supple. JVP .10 Carotids 2+ bilat; no bruits. No lymphadenopathy or thryomegaly appreciated. Cor: PMI nondisplaced. Tachy regular. + rub Lungs: clear Abdomen: obese soft, nontender, nondistended. No hepatosplenomegaly. No bruits or masses. Good bowel sounds. Extremities: no cyanosis, clubbing, rash, tr edema Neuro: alert & orientedx3, cranial nerves grossly intact. moves all 4 extremities w/o difficulty. Affect pleasant  Telemetry: SR 110-120 Personally reviewed  Labs: Basic Metabolic Panel: Recent Labs  Lab 05/04/23 0612 05/05/23 0251  NA 137 133*  K 4.2 3.9  CL 103 103  CO2 19* 19*  GLUCOSE 269* 252*  BUN 13 19  CREATININE 1.06* 1.35*  CALCIUM 9.3 8.7*  MG  --  1.8    Liver Function Tests: Recent Labs  Lab 05/04/23 0612  AST 31  ALT 21  ALKPHOS 137*  BILITOT 0.4  PROT 6.3*  ALBUMIN 3.5   No results for input(s): "LIPASE", "AMYLASE" in the last 168 hours. No results  for input(s): "AMMONIA" in the last 168 hours.  CBC: Recent Labs  Lab 05/04/23 0612 05/05/23 0251  WBC 16.1* 17.6*  NEUTROABS 8.4*  --   HGB 13.1 11.3*  HCT 41.2 36.4  MCV 90.7 89.4  PLT 292 267    Cardiac Enzymes: No results for input(s): "CKTOTAL", "CKMB", "CKMBINDEX", "TROPONINI" in the last 168 hours.  BNP: BNP (last 3 results) Recent Labs    05/04/23 0620  BNP 123.2*    ProBNP (last 3 results) No results for input(s): "PROBNP" in the last 8760 hours.    Other results:  Imaging: DG Chest Port 1 View  Result Date: 05/04/2023 CLINICAL DATA:  PICC line placement EXAM: PORTABLE CHEST 1 VIEW COMPARISON:  Previous studies including the examination done earlier today FINDINGS: Transverse diameter of heart is increased. Central pulmonary vessels are slightly less prominent. Increased interstitial markings in both lungs suggesting interstitial pulmonary edema. There is no focal pulmonary consolidation. Lateral CP angles are clear. There is no pneumothorax. There is interval placement of PICC line through the left upper extremity with its tip in superior vena cava. IMPRESSION: Tip of PICC line is seen in superior vena cava. There is no pneumothorax. There is decreasing pulmonary vascular congestion. Pulmonary edema. Electronically Signed   By: Ernie Avena M.D.   On: 05/04/2023 17:20   [place] EKG SITE RITE  Result Date: 05/04/2023 If [employer]  not attached, placement could not be confirmed due to current cardiac rhythm.  ECHOCARDIOGRAM COMPLETE  Result Date: 05/04/2023    ECHOCARDIOGRAM REPORT   Patient Name:   [name] Date of Exam: 05/04/2023 Medical Rec #:  [id number]   Height:       64.0 in Accession #:    [id number]  Weight:       220.0 lb Date of Birth:  10/15/1966   BSA:          2.037 m Patient Age:    57 years    BP:           84/72 mmHg Patient Gender: F           HR:           111 bpm. Exam Location:  Inpatient Procedure: 2D Echo, Cardiac Doppler, Color  Doppler and Intracardiac            Opacification Agent Indications:    CHF- Acute Systolic  History:        Patient has prior history of Echocardiogram examinations, most                 recent 03/28/2022. Risk Factors:Hypertension, Dyslipidemia, Sleep                 Apnea, Hypothyroidism and Diabetes.  Sonographer:    Raeford Razor Sonographer#2:  Darlys Gales Referring Phys: [phone] MICHAEL COOPER  Sonographer Comments: Technically difficult study due to poor echo windows and patient is obese. Image acquisition challenging due to patient body habitus. IMPRESSIONS  1. Septal and apical akinesis Hyperdynamic basal function findings consistent wtih LAD infarct No mural apical thrombus with definity. Left ventricular ejection fraction, by estimation, is 45 to 50%. The left ventricle has mildly decreased function. The  left ventricle demonstrates regional wall motion abnormalities (see scoring diagram/findings for description). The left ventricular internal cavity size was mildly dilated. Left ventricular diastolic parameters were normal.  2. Right ventricular systolic function is normal. The right ventricular size is normal.  3. The mitral valve is normal in structure. No evidence of mitral valve regurgitation. No evidence of mitral stenosis.  4. The aortic valve is tricuspid. There is mild calcification of the aortic valve. There is mild thickening of the aortic valve. Aortic valve regurgitation is not visualized. Aortic valve sclerosis is present, with no evidence of aortic valve stenosis.  5. The inferior vena cava is normal in size with greater than 50% respiratory variability, suggesting right atrial pressure of 3 mmHg. FINDINGS  Left Ventricle: Septal and apical akinesis Hyperdynamic basal function findings consistent wtih LAD infarct No mural apical thrombus with definity. Left ventricular ejection fraction, by estimation, is 45 to 50%. The left ventricle has mildly decreased function. The left ventricle demonstrates  regional wall motion abnormalities. Definity contrast agent was given IV to delineate the left ventricular endocardial borders. The left ventricular internal cavity size was mildly dilated. There is no left ventricular hypertrophy. Left ventricular diastolic parameters were normal. Right Ventricle: The right ventricular size is normal. No increase in right ventricular wall thickness. Right ventricular systolic function is normal. Left Atrium: Left atrial size was normal in size. Right Atrium: Right atrial size was normal in size. Pericardium: There is no evidence of pericardial effusion. Mitral Valve: The mitral valve is normal in structure. No evidence of mitral valve regurgitation. No evidence of mitral valve stenosis. Tricuspid Valve: The tricuspid valve is normal in structure. Tricuspid valve regurgitation is not demonstrated. No evidence  of tricuspid stenosis. Aortic Valve: The aortic valve is tricuspid. There is mild calcification of the aortic valve. There is mild thickening of the aortic valve. Aortic valve regurgitation is not visualized. Aortic valve sclerosis is present, with no evidence of aortic valve stenosis. Aortic valve peak gradient measures 7.5 mmHg. Pulmonic Valve: The pulmonic valve was normal in structure. Pulmonic valve regurgitation is not visualized. No evidence of pulmonic stenosis. Aorta: The aortic root is normal in size and structure. Venous: The inferior vena cava is normal in size with greater than 50% respiratory variability, suggesting right atrial pressure of 3 mmHg. IAS/Shunts: No atrial level shunt detected by color flow Doppler.  LEFT VENTRICLE PLAX 2D LVIDd:         5.10 cm      Diastology LVIDs:         3.70 cm      LV e' medial:    8.70 cm/s LV PW:         0.80 cm      LV E/e' medial:  11.6 LV IVS:        0.80 cm      LV e' lateral:   15.20 cm/s LVOT diam:     1.80 cm      LV E/e' lateral: 6.6 LV SV:         22 LV SV Index:   11 LVOT Area:     2.54 cm  LV Volumes (MOD) LV vol  d, MOD A2C: 122.0 ml LV vol d, MOD A4C: 116.0 ml LV vol s, MOD A2C: 56.5 ml LV vol s, MOD A4C: 54.7 ml LV SV MOD A2C:     65.5 ml LV SV MOD A4C:     116.0 ml LV SV MOD BP:      66.2 ml RIGHT VENTRICLE RV Basal diam:  2.60 cm RV S prime:     12.00 cm/s TAPSE (M-mode): 1.4 cm LEFT ATRIUM             Index        RIGHT ATRIUM          Index LA diam:        3.60 cm 1.77 cm/m   RA Area:     7.55 cm LA Vol (A2C):   25.1 ml 12.32 ml/m  RA Volume:   12.10 ml 5.94 ml/m LA Vol (A4C):   17.8 ml 8.74 ml/m LA Biplane Vol: 21.1 ml 10.36 ml/m  AORTIC VALVE AV Area (Vmax): 1.31 cm AV Vmax:        137.00 cm/s AV Peak Grad:   7.5 mmHg LVOT Vmax:      70.70 cm/s LVOT Vmean:     44.200 cm/s LVOT VTI:       0.088 m  AORTA Ao Root diam: 3.10 cm Ao Asc diam:  3.50 cm MITRAL VALVE MV Area (PHT): 4.21 cm     SHUNTS MV Decel Time: 180 msec     Systemic VTI:  0.09 m MV E velocity: 101.00 cm/s  Systemic Diam: 1.80 cm MV A velocity: 120.00 cm/s MV E/A ratio:  0.84 Charlton Haws MD Electronically signed by Charlton Haws MD Signature Date/Time: 05/04/2023/12:35:25 PM    Final    CARDIAC CATHETERIZATION  Result Date: 05/04/2023   Prox RCA lesion is 40% stenosed.   Prox LAD to Mid LAD lesion is 99% stenosed.   A drug-eluting stent was successfully placed using a STENT SYNERGY XD 3.0X32.   Post intervention, there is a  0% residual stenosis.   LV end diastolic pressure is moderately elevated.   Recommend uninterrupted dual antiplatelet therapy with Aspirin 81mg  daily and Ticagrelor 90mg  twice daily for a minimum of 12 months (ACS-Class I recommendation). 1.  Severe single-vessel disease with subtotal occlusion of the proximal LAD, 99% stenosis reduced to 0% with PTCA and stenting using a 3.0 x 32 mm Synergy DES.  TIMI I flow at baseline, TIMI-3 flow post procedure. 2.  Patent left main, left circumflex, and dominant RCA with mild nonobstructive plaquing in those vessels 3.  Moderately elevated LVEDP of 22 mmHg 4.  Poorly visualized LV  function by ventriculography, but basal to mid segments appear hyperdynamic, possible hypokinesis of the distal anterior wall and apex.  Unable to estimate LVEF.  2D echo to be done for further assessment Recommendations: CV-ICU care.  DAPT with aspirin and ticagrelor x 12 months.  Aggressive post MI medical therapy. Needs close evaluation, consider diuresis. LVEDP 24 mmHg, lactate 2.9, SBP > 90 on no support, but narrow pulse pressure.   DG Chest Portable 1 View  Result Date: 05/04/2023 CLINICAL DATA:  57 year old female with chest pain, possible myocardial infarction. EXAM: PORTABLE CHEST 1 VIEW COMPARISON:  Portable chest 03/20/2023. FINDINGS: Portable AP semi upright view at 0607 hours. Similar low lung volumes and mediastinal contours, but diffusely indistinct pulmonary vasculature now. No pneumothorax, pleural effusion or consolidation. Visualized tracheal air column is within normal limits. No acute osseous abnormality identified. Paucity of bowel gas in the visible abdomen. IMPRESSION: Acute Pulmonary Edema.  Low lung volumes stable compared to May. Electronically Signed   By: Odessa Fleming M.D.   On: 05/04/2023 06:26     Medications:     Scheduled Medications:  amitriptyline  50 mg Oral QHS   aspirin  325 mg Oral BID   Chlorhexidine Gluconate Cloth  6 each Topical Daily   colchicine  0.6 mg Oral BID   DULoxetine  60 mg Oral BID   enoxaparin (LOVENOX) injection  40 mg Subcutaneous Q24H   gabapentin  600 mg Oral TID   insulin aspart  0-15 Units Subcutaneous TID WC   insulin aspart  0-5 Units Subcutaneous QHS   levothyroxine  25 mcg Oral Daily   linaclotide  145 mcg Oral QAC breakfast   magnesium oxide  400 mg Oral Daily   pantoprazole  40 mg Oral BID AC   rosuvastatin  40 mg Oral Daily   senna  2 tablet Oral BID   sodium chloride flush  10-40 mL Intracatheter Q12H   sodium chloride flush  3 mL Intravenous Q12H   ticagrelor  90 mg Oral BID   topiramate  100 mg Oral Daily     Infusions:  sodium chloride 20 mL/hr at 05/04/23 0631   sodium chloride Stopped (05/04/23 1630)   milrinone 0.125 mcg/kg/min (05/05/23 0600)   vasopressin      PRN Medications: sodium chloride, acetaminophen, albuterol, HYDROcodone-acetaminophen, lidocaine, methocarbamol, morphine injection, ondansetron (ZOFRAN) IV, sodium chloride flush, sodium chloride flush    Patient Profile    [name] is a 57 y.o. 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 Admitted with STEMI s/p PDI to LAD now in cardiogenic shock.    Assessment/Plan:   Cardiogenic shock -> acute HF with mildly-reduced EF - SCAI stage C, initial lactic acid 3.2 - Echo 6/23 EF 55%, no RWMA, normal RV, trivial MR - Echo 7/12,  EF 45% - On milrinone 0.125. Co-ox 53%. Will increase milrinone to 0.2. Recheck co-ox at noon. Recheck lactic acid - volume mildly elevated, Repeat 40 IV lasix x 1 - BP too low for GDMT. Will add digoxin - strict I&O, daily weights   Anterior STEMI, CAD - HsTrop >24K on admission - s/p PCI to LAD 7/12, post DES 0% residual stenosis. Medical management of 40% stenosed RCA.  - plan for DAPT with ASA and Ticagrelor x12 months - Continue statin, LDL unable to be calculated with high TGD - Ongoing CP seems to be related to pericarditis.  - ECG with persistent ST elevation. Need to watch closely for mechanical complications. Will repeat echo Monday or sooner if needed. - Consult CR  3. Post-MI chest pain -possible pericarditis - has rub on exam - start colchicine - increase ASA to 625 bid   DM II - A1c last 7.6 3/24, update - SSI while admitted per primary - has diabetic foot ulcer, WOC following   Obesity - Body mass index is 37.76 kg/m.  - consider GLP-1 as OP   OSA - does not use CPAP   HLD - Continue statin, need repeat lipid panel ~4 months    CRITICAL CARE Performed  by: Arvilla Meres  Total critical care time: 40 minutes  Critical care time was exclusive of separately billable procedures and treating other patients.  Critical care was necessary to treat or prevent imminent or life-threatening deterioration.  Critical care was time spent personally by me (independent of midlevel providers or residents) on the following activities: development of treatment plan with patient and/or surrogate as well as nursing, discussions with consultants, evaluation of patient's response to treatment, examination of patient, obtaining history from patient or surrogate, ordering and performing treatments and interventions, ordering and review of laboratory studies, ordering and review of radiographic studies, pulse oximetry and re-evaluation of patient's condition.    Length of Stay: 1   Arvilla Meres MD 05/05/2023, 9:53 AM  Advanced Heart Failure Team Pager [phone] (M-F; 7a - 4p)  Please contact [facility] for night-coverage after hours (4p -7a ) and weekends on [email or web address]

## 2023-05-05 NOTE — Progress Notes (Signed)
Patient complaining of pain in the mid sternum and under the left breast. States it is similar to chest pain on arrival to hospital. Stat ECG performed and viewed by Dr. Gala Romney. He feels this may be pericarditis as the pain is worse when pt is upright and mild sternal rub noted. HR 110-120. Exhibiting mild dyspnea. Will continue to monitor. See EMR for orders received.

## 2023-05-06 ENCOUNTER — Encounter (HOSPITAL_COMMUNITY): Payer: Self-pay | Admitting: Anesthesiology

## 2023-05-06 ENCOUNTER — Encounter (HOSPITAL_COMMUNITY)
Admission: EM | Disposition: E | Payer: Self-pay | Source: Home / Self Care | Attending: Thoracic Surgery (Cardiothoracic Vascular Surgery)

## 2023-05-06 ENCOUNTER — Inpatient Hospital Stay (HOSPITAL_COMMUNITY): Payer: BC Managed Care – PPO

## 2023-05-06 DIAGNOSIS — I2102 ST elevation (STEMI) myocardial infarction involving left anterior descending coronary artery: Secondary | ICD-10-CM

## 2023-05-06 DIAGNOSIS — I232 Ventricular septal defect as current complication following acute myocardial infarction: Secondary | ICD-10-CM

## 2023-05-06 DIAGNOSIS — R570 Cardiogenic shock: Secondary | ICD-10-CM | POA: Diagnosis not present

## 2023-05-06 DIAGNOSIS — I2510 Atherosclerotic heart disease of native coronary artery without angina pectoris: Secondary | ICD-10-CM | POA: Diagnosis not present

## 2023-05-06 DIAGNOSIS — R011 Cardiac murmur, unspecified: Secondary | ICD-10-CM | POA: Diagnosis not present

## 2023-05-06 HISTORY — PX: IABP INSERTION: CATH118242

## 2023-05-06 HISTORY — PX: RIGHT HEART CATH: CATH118263

## 2023-05-06 LAB — COOXEMETRY PANEL
Carboxyhemoglobin: 1.8 % — ABNORMAL HIGH (ref 0.5–1.5)
Carboxyhemoglobin: 0.5 % (ref 0.5–1.5)
Carboxyhemoglobin: 1.3 % (ref 0.5–1.5)
Carboxyhemoglobin: 1.8 % — ABNORMAL HIGH (ref 0.5–1.5)
Methemoglobin: <0.7 % (ref 0.0–1.5)
Methemoglobin: <0.7 % (ref 0.0–1.5)
Methemoglobin: <0.7 % (ref 0.0–1.5)
Methemoglobin: <0.7 % (ref 0.0–1.5)
O2 Saturation: 52 %
O2 Saturation: 39.6 %
O2 Saturation: 50.3 %
O2 Saturation: 44.8 %
Total hemoglobin: 11.1 g/dL — ABNORMAL LOW (ref 12.0–16.0)
Total hemoglobin: 10.2 g/dL — ABNORMAL LOW (ref 12.0–16.0)
Total hemoglobin: 8.3 g/dL — ABNORMAL LOW (ref 12.0–16.0)
Total hemoglobin: 10 g/dL — ABNORMAL LOW (ref 12.0–16.0)

## 2023-05-06 LAB — BASIC METABOLIC PANEL
Anion gap: 10 (ref 5–15)
Anion gap: 15 (ref 5–15)
BUN: 22 mg/dL — ABNORMAL HIGH (ref 6–20)
BUN: 30 mg/dL — ABNORMAL HIGH (ref 6–20)
CO2: 20 mmol/L — ABNORMAL LOW (ref 22–32)
CO2: 18 mmol/L — ABNORMAL LOW (ref 22–32)
Calcium: 8 mg/dL — ABNORMAL LOW (ref 8.9–10.3)
Calcium: 7.8 mg/dL — ABNORMAL LOW (ref 8.9–10.3)
Chloride: 99 mmol/L (ref 98–111)
Chloride: 94 mmol/L — ABNORMAL LOW (ref 98–111)
Creatinine, Ser: 1.45 mg/dL — ABNORMAL HIGH (ref 0.44–1.00)
Creatinine, Ser: 1.69 mg/dL — ABNORMAL HIGH (ref 0.44–1.00)
GFR, Estimated: 42 mL/min — ABNORMAL LOW (ref >60)
GFR, Estimated: 35 mL/min — ABNORMAL LOW (ref >60)
Glucose, Bld: 277 mg/dL — ABNORMAL HIGH (ref 70–99)
Glucose, Bld: 422 mg/dL — ABNORMAL HIGH (ref 70–99)
Potassium: 3.3 mmol/L — ABNORMAL LOW (ref 3.5–5.1)
Potassium: 4.3 mmol/L (ref 3.5–5.1)
Sodium: 129 mmol/L — ABNORMAL LOW (ref 135–145)
Sodium: 127 mmol/L — ABNORMAL LOW (ref 135–145)

## 2023-05-06 LAB — CBC
HCT: 32.3 % — ABNORMAL LOW (ref 36.0–46.0)
Hemoglobin: 10.1 g/dL — ABNORMAL LOW (ref 12.0–15.0)
MCH: 28.1 pg (ref 26.0–34.0)
MCHC: 31.3 g/dL (ref 30.0–36.0)
MCV: 90 fL (ref 80.0–100.0)
Platelets: 239 10*3/uL (ref 150–400)
RBC: 3.59 MIL/uL — ABNORMAL LOW (ref 3.87–5.11)
RDW: 14.2 % (ref 11.5–15.5)
WBC: 14.3 10*3/uL — ABNORMAL HIGH (ref 4.0–10.5)
nRBC: 0.1 % (ref 0.0–0.2)

## 2023-05-06 LAB — URINALYSIS, ROUTINE W REFLEX MICROSCOPIC
Bilirubin Urine: NEGATIVE
Glucose, UA: NEGATIVE mg/dL
Hgb urine dipstick: NEGATIVE
Ketones, ur: NEGATIVE mg/dL
Nitrite: NEGATIVE
Protein, ur: 100 mg/dL — AB
Specific Gravity, Urine: 1.029 (ref 1.005–1.030)
WBC, UA: >50 WBC/hpf (ref 0–5)
pH: 5 (ref 5.0–8.0)

## 2023-05-06 LAB — GLUCOSE, CAPILLARY
Glucose-Capillary: 259 mg/dL — ABNORMAL HIGH (ref 70–99)
Glucose-Capillary: 286 mg/dL — ABNORMAL HIGH (ref 70–99)
Glucose-Capillary: 296 mg/dL — ABNORMAL HIGH (ref 70–99)
Glucose-Capillary: 292 mg/dL — ABNORMAL HIGH (ref 70–99)
Glucose-Capillary: 352 mg/dL — ABNORMAL HIGH (ref 70–99)

## 2023-05-06 LAB — ECHOCARDIOGRAM LIMITED
Height: 64 in
S' Lateral: 2.7 cm
Weight: 3710.78 oz

## 2023-05-06 LAB — LACTIC ACID, PLASMA
Lactic Acid, Venous: 2.5 mmol/L (ref 0.5–1.9)
Lactic Acid, Venous: 3.7 mmol/L (ref 0.5–1.9)
Lactic Acid, Venous: 2.5 mmol/L (ref 0.5–1.9)

## 2023-05-06 LAB — MAGNESIUM: Magnesium: 2.3 mg/dL (ref 1.7–2.4)

## 2023-05-06 SURGERY — IABP INSERTION
Anesthesia: LOCAL

## 2023-05-06 MED ORDER — AMIODARONE HCL IN DEXTROSE 360-4.14 MG/200ML-% IV SOLN
30.0000 mg/h | INTRAVENOUS | Status: DC
  Administered 2023-05-06 – 2023-05-17 (×22): 30 mg/h via INTRAVENOUS
  Filled 2023-05-06 (×22): qty 200

## 2023-05-06 MED ORDER — POTASSIUM CHLORIDE CRYS ER 20 MEQ PO TBCR
40.0000 meq | EXTENDED_RELEASE_TABLET | Freq: Once | ORAL | Status: AC
  Administered 2023-05-06: 40 meq via ORAL
  Filled 2023-05-06: qty 2

## 2023-05-06 MED ORDER — MIDAZOLAM HCL 2 MG/2ML IJ SOLN
INTRAMUSCULAR | Status: DC | PRN
  Administered 2023-05-06: 1 mg via INTRAVENOUS

## 2023-05-06 MED ORDER — HYDRALAZINE HCL 20 MG/ML IJ SOLN: 10.0000 mg | INTRAMUSCULAR | Status: AC | PRN

## 2023-05-06 MED ORDER — HEPARIN (PORCINE) 25000 UT/250ML-% IV SOLN
1150.0000 [IU]/h | INTRAVENOUS | Status: DC
  Administered 2023-05-06: 800 [IU]/h via INTRAVENOUS
  Administered 2023-05-08: 1000 [IU]/h via INTRAVENOUS
  Administered 2023-05-09: 1050 [IU]/h via INTRAVENOUS
  Administered 2023-05-10 (×2): 1150 [IU]/h via INTRAVENOUS
  Filled 2023-05-06 (×5): qty 250

## 2023-05-06 MED ORDER — ONDANSETRON HCL 4 MG/2ML IJ SOLN: 4.0000 mg | Freq: Four times a day (QID) | INTRAMUSCULAR | Status: DC | PRN

## 2023-05-06 MED ORDER — FENTANYL CITRATE (PF) 100 MCG/2ML IJ SOLN
INTRAMUSCULAR | Status: AC
  Filled 2023-05-06: qty 2

## 2023-05-06 MED ORDER — AMIODARONE HCL IN DEXTROSE 360-4.14 MG/200ML-% IV SOLN
60.0000 mg/h | INTRAVENOUS | Status: AC
  Administered 2023-05-06 (×2): 60 mg/h via INTRAVENOUS
  Filled 2023-05-06 (×2): qty 200

## 2023-05-06 MED ORDER — ACETAMINOPHEN 325 MG PO TABS: 650.0000 mg | ORAL_TABLET | ORAL | Status: DC | PRN

## 2023-05-06 MED ORDER — SODIUM CHLORIDE 0.9% FLUSH
3.0000 mL | Freq: Two times a day (BID) | INTRAVENOUS | Status: DC
  Administered 2023-05-07 – 2023-05-10 (×5): 3 mL via INTRAVENOUS

## 2023-05-06 MED ORDER — MIDAZOLAM HCL 2 MG/2ML IJ SOLN
INTRAMUSCULAR | Status: AC
  Filled 2023-05-06: qty 2

## 2023-05-06 MED ORDER — SODIUM CHLORIDE 0.9 % IV SOLN: 250.0000 mL | INTRAVENOUS | Status: DC | PRN

## 2023-05-06 MED ORDER — FUROSEMIDE 10 MG/ML IJ SOLN
80.0000 mg | Freq: Once | INTRAMUSCULAR | Status: AC
  Administered 2023-05-06: 80 mg via INTRAVENOUS
  Filled 2023-05-06: qty 8

## 2023-05-06 MED ORDER — AMIODARONE LOAD VIA INFUSION
150.0000 mg | Freq: Once | INTRAVENOUS | Status: AC
  Administered 2023-05-06: 150 mg via INTRAVENOUS
  Filled 2023-05-06: qty 83.34

## 2023-05-06 MED ORDER — MILRINONE LACTATE IN DEXTROSE 20-5 MG/100ML-% IV SOLN
0.3750 ug/kg/min | INTRAVENOUS | Status: DC
  Administered 2023-05-06 – 2023-05-11 (×13): 0.375 ug/kg/min via INTRAVENOUS
  Filled 2023-05-06 (×13): qty 100

## 2023-05-06 MED ORDER — IOHEXOL 350 MG/ML SOLN
INTRAVENOUS | Status: DC | PRN
  Administered 2023-05-06: 10 mL via INTRA_ARTERIAL

## 2023-05-06 MED ORDER — SODIUM CHLORIDE 0.9% FLUSH: 3.0000 mL | INTRAVENOUS | Status: DC | PRN

## 2023-05-06 MED ORDER — HEPARIN SODIUM (PORCINE) 1000 UNIT/ML IJ SOLN
INTRAMUSCULAR | Status: DC | PRN
  Administered 2023-05-06: 5000 [IU] via INTRAVENOUS

## 2023-05-06 MED ORDER — FUROSEMIDE 10 MG/ML IJ SOLN
80.0000 mg | Freq: Two times a day (BID) | INTRAMUSCULAR | Status: DC
  Administered 2023-05-07 – 2023-05-10 (×8): 80 mg via INTRAVENOUS
  Filled 2023-05-06 (×9): qty 8

## 2023-05-06 MED ORDER — HEPARIN SODIUM (PORCINE) 1000 UNIT/ML IJ SOLN
INTRAMUSCULAR | Status: AC
  Filled 2023-05-06: qty 10

## 2023-05-06 MED ORDER — LIDOCAINE HCL (PF) 1 % IJ SOLN
INTRAMUSCULAR | Status: AC
  Filled 2023-05-06: qty 30

## 2023-05-06 MED ORDER — INSULIN GLARGINE-YFGN 100 UNIT/ML ~~LOC~~ SOLN
10.0000 [IU] | Freq: Every day | SUBCUTANEOUS | Status: DC
  Administered 2023-05-06: 10 [IU] via SUBCUTANEOUS
  Filled 2023-05-06 (×2): qty 0.1

## 2023-05-06 MED ORDER — LIDOCAINE HCL (PF) 1 % IJ SOLN
INTRAMUSCULAR | Status: DC | PRN
  Administered 2023-05-06 (×2): 5 mL

## 2023-05-06 MED ORDER — ASPIRIN 81 MG PO TBEC
81.0000 mg | DELAYED_RELEASE_TABLET | Freq: Every day | ORAL | Status: DC
  Administered 2023-05-07 – 2023-05-10 (×4): 81 mg via ORAL
  Filled 2023-05-06 (×5): qty 1

## 2023-05-06 MED ORDER — SODIUM CHLORIDE 0.9 % IV SOLN: INTRAVENOUS | Status: DC

## 2023-05-06 SURGICAL SUPPLY — 11 items
BALLN IABP SENSA PLUS 7.5F 40C (BALLOONS) ×1
BALLOON IABP SENS PLUS 7.5F40C (BALLOONS) IMPLANT
CATH SWAN GANZ VIP 7.5F (CATHETERS) IMPLANT
KIT HEART LEFT (KITS) ×1 IMPLANT
KIT MICROPUNCTURE NIT STIFF (SHEATH) IMPLANT
PACK CARDIAC CATHETERIZATION (CUSTOM PROCEDURE TRAY) ×1 IMPLANT
SHEATH PINNACLE 8F 10CM (SHEATH) IMPLANT
SHEATH PROBE COVER 6X72 (BAG) IMPLANT
TRANSDUCER W/STOPCOCK (MISCELLANEOUS) ×1 IMPLANT
TUBING CIL FLEX 10 FLL-RA (TUBING) ×1 IMPLANT
WIRE MICROINTRODUCER 60CM (WIRE) IMPLANT

## 2023-05-06 NOTE — Progress Notes (Signed)
   Now on milrinone 0.375. Co-ox dropping. Now 44%. Lactic acid running 2.5-3.7.   Scr worsening.    Discussed case with IC team and TCTS.   Will take to cath lab for PA catheter and IABP in an attempt to temporize her hemodynamics and give infarct area time to fibrose prior to attempted VSD repair.  Will need a close look at femoral and iliac anatomy to make sure suitable for IAB without high-risk for ;imb ischemia. Axillary approach could be bail out option, if needed.   Discussed with patient and her husband.   Additional CCT 45 mins  Merrilee Ancona, MD  9:21 PM   

## 2023-05-06 NOTE — Progress Notes (Signed)
ANTICOAGULATION CONSULT NOTE - Initial Consult  Pharmacy Consult for heparin  Indication: IABP  Allergies  Allergen Reactions   Ibuprofen Other (See Comments)    Pt is unable to take this due to kidney problems.      Penicillins Rash and Other (See Comments)    Has patient had a PCN reaction causing immediate rash, facial/tongue/throat swelling, SOB or lightheadedness with hypotension: No Has patient had a PCN reaction causing severe rash involving mucus membranes or skin necrosis: No Has patient had a PCN reaction that required hospitalization No Has patient had a PCN reaction occurring within the last 10 years: No If all of the above answers are "NO", then may proceed with Cephalosporin use.    Patient Measurements: Height: 5\' 4"  (162.6 cm) Weight: 105.2 kg (231 lb 14.8 oz) IBW/kg (Calculated) : 54.7 HEPARIN DW (KG): 77.8    Vital Signs: BP: 84/65 (07/14 2224) Pulse Rate: 97 (07/14 2224)  Labs: Recent Labs    05/04/23 0612 05/04/23 0620 05/04/23 0837 05/05/23 0251 05/05/23 1001 05/06/23 0255 05/06/23 1920  HGB 13.1  --   --  11.3*  --  10.1*  --   HCT 41.2  --   --  36.4  --  32.3*  --   PLT 292  --   --  267  --  239  --   APTT 27  --   --   --   --   --   --   LABPROT 13.4  --   --   --   --   --   --   INR 1.0  --   --   --   --   --   --   CREATININE 1.06*  --   --  1.35*  --  1.45* 1.69*  TROPONINIHS  --  2,536* >24,000*  --  >24,000*  --   --     Estimated Creatinine Clearance: 43.4 mL/min (A) (by C-G formula based on SCr of 1.69 mg/dL (H)).   Medical History: Past Medical History:  Diagnosis Date   Chronic cough    Chronic pain    Diabetes mellitus without complication (HCC)    Dyspnea    GERD (gastroesophageal reflux disease)    Hypokalemia    Hypothyroidism    Left wrist pain 06/01/2021   Myonecrosis (HCC) 07/01/2021   Neurocardiogenic syncope    OSA (obstructive sleep apnea)    does not use CPAP   Osteomyelitis (HCC)    bilateral feet    Peripheral neuropathy    Presence of intrathecal pump    recieves Prialt/bupivicaine   Tear of gluteus medius tendon 07/01/2021   Wegener's granulomatosis 2009   Wegner's disease (congenital syphilitic osteochondritis)     Medications:  Medications Prior to Admission  Medication Sig Dispense Refill Last Dose   acetaminophen (TYLENOL) 325 MG tablet Take 2 tablets (650 mg total) by mouth every 6 (six) hours as needed for mild pain (or Fever >/= 101).   Past Week   albuterol (VENTOLIN HFA) 108 (90 Base) MCG/ACT inhaler Inhale 2 puffs into the lungs every 6 (six) hours as needed for wheezing or shortness of breath. 1 each 11 unknown   amitriptyline (ELAVIL) 50 MG tablet TAKE 1 TABLET BY MOUTH AT BEDTIME FOR SLEEP. (Patient taking differently: Take 50 mg by mouth at bedtime. TAKE 1 TABLET BY MOUTH AT BEDTIME FOR SLEEP.) 30 tablet 0 05/03/2023   aspirin EC 81 MG tablet Take 162 mg by  mouth daily. Swallow whole.   05/04/2023   Calcium Carb-Cholecalciferol (CALCIUM CARBONATE-VITAMIN D3 PO) Take 1 tablet by mouth daily.   05/03/2023   cyanocobalamin (VITAMIN B12) 1000 MCG tablet Take 1,000 mcg by mouth daily.   05/03/2023   DULoxetine (CYMBALTA) 60 MG capsule Take 60 mg by mouth 2 (two) times daily.   05/03/2023   levothyroxine (SYNTHROID) 25 MCG tablet Take 1 tablet (25 mcg total) by mouth daily. 30 tablet 0 05/03/2023   LINZESS 145 MCG CAPS capsule Take one capsule (145 mcg dose) by mouth daily. 30 capsule 0 Past Month   magnesium oxide (MAG-OX) 400 MG tablet Take 400 mg by mouth daily.   Past Month   metFORMIN (GLUCOPHAGE) 500 MG tablet Take 1 tablet (500 mg total) by mouth 2 (two) times daily with a meal. 180 tablet 3 05/03/2023   methocarbamol (ROBAXIN) 500 MG tablet Take 1 tablet (500 mg total) by mouth every 6 (six) hours as needed for muscle spasms. 30 tablet 0 Past Week   naloxone (NARCAN) nasal spray 4 mg/0.1 mL Place 0.4 mg into the nose once.   unknown   NUCYNTA 50 MG tablet Take 50 mg by mouth 3  (three) times daily.   05/03/2023   pantoprazole (PROTONIX) 40 MG tablet TAKE 1 TABLET BY MOUTH TWICE DAILY BEFORE A MEAL 30 tablet 0 05/03/2023   rosuvastatin (CRESTOR) 10 MG tablet TAKE 1 TABLET BY MOUTH AT BEDTIME 30 tablet 0 05/03/2023   topiramate (TOPAMAX) 100 MG tablet TAKE (1) TABLET BY MOUTH ONCE DAILY. 30 tablet 0 05/03/2023   Vitamin D, Ergocalciferol, (DRISDOL) 1.25 MG (50000 UNIT) CAPS capsule Take 1 capsule (50,000 Units total) by mouth every Friday. 7 capsule 3 Past Week   gabapentin (NEURONTIN) 300 MG capsule Take 1 capsule (300 mg total) by mouth at bedtime. (Patient taking differently: Take 600 mg by mouth 3 (three) times daily. Patient is taking 2 in the am, 2 in the afternoon, and 2 at bedtime.) 90 capsule 3    lidocaine (LIDODERM) 5 % Place 1 patch onto the skin daily as needed (for pain). Remove & Discard patch within 12 hours or as directed by MD      Scheduled:   [MAR Hold] amitriptyline  50 mg Oral QHS   [MAR Hold] aspirin EC  81 mg Oral Daily   [MAR Hold] Chlorhexidine Gluconate Cloth  6 each Topical Daily   [MAR Hold] digoxin  0.125 mg Oral Daily   [MAR Hold] DULoxetine  60 mg Oral BID   [MAR Hold] enoxaparin (LOVENOX) injection  40 mg Subcutaneous Q24H   [MAR Hold] gabapentin  600 mg Oral TID   [MAR Hold] insulin aspart  0-15 Units Subcutaneous TID WC   [MAR Hold] insulin aspart  0-5 Units Subcutaneous QHS   [MAR Hold] insulin glargine-yfgn  10 Units Subcutaneous QHS   [MAR Hold] levothyroxine  25 mcg Oral Daily   [MAR Hold] linaclotide  145 mcg Oral QAC breakfast   [MAR Hold] magnesium oxide  400 mg Oral Daily   [MAR Hold] pantoprazole  40 mg Oral BID AC   [MAR Hold] rosuvastatin  40 mg Oral Daily   [MAR Hold] senna  2 tablet Oral BID   [MAR Hold] sodium chloride flush  10-40 mL Intracatheter Q12H   [MAR Hold] sodium chloride flush  3 mL Intravenous Q12H   [MAR Hold] tapentadol  50 mg Oral TID   [MAR Hold] ticagrelor  90 mg Oral BID   [MAR Hold] topiramate  100  mg Oral Daily    Assessment: 57 yo female with VSD and dropping co-ox. She is noted s/p cath with IABP. Pharmacy consulted to dose heparin. No anticoagulants noted PTA  -5000 units IV heparin given in cath  Goal of Therapy:  Heparin level 0.2-0.5 units/ml Monitor platelets by anticoagulation protocol: Yes   Plan:  -start heparin at 800 units/hr -heparin level in 8 hrs -daily heparin level and CBC  Harland German, PharmD Clinical Pharmacist **Pharmacist phone directory can now be found on [email or web address] ([email or web address]).  Listed under [facility].

## 2023-05-06 NOTE — Progress Notes (Signed)
   Stat bedside echo done. EF 55% has acute apical VSD.   Have consulted TCTS. Suspect she may not be surgical candidate.   Hemodynamics currently stable.  Follow co-ox and lactate.   No acute need for PA cath or mechanical support.   Arvilla Meres, MD  8:45 AM

## 2023-05-06 NOTE — Progress Notes (Signed)
  Echocardiogram 2D Echocardiogram has been performed.  Delcie Roch 05/06/2023, 8:36 AM

## 2023-05-06 NOTE — Anesthesia Preprocedure Evaluation (Deleted)
Anesthesia Evaluation  Patient identified by MRN, date of birth, ID band Patient awake    Reviewed: Allergy & Precautions, NPO status , Patient's Chart, lab work & pertinent test results  Airway        Dental   Pulmonary sleep apnea and Continuous Positive Airway Pressure Ventilation , pneumonia Wegeners granulomatosis          Cardiovascular hypertension, Pt. on medications + Past MI    Echo 05/06/2023  1. LV function is overall vigorous but there is apical akinesis with evidence of an acute apical VSD with left to right flow. Left ventricular ejection fraction, by estimation, is 60 to 65%. The left ventricle has normal function. The left ventricle demonstrates regional wall motion abnormalities (see scoring diagram/findings for description). Left ventricular diastolic parameters are indeterminate.   2. Right ventricular systolic function is normal. There is normal pulmonary artery systolic pressure.   3. Left atrial size was mildly dilated.   4. Mild mitral valve regurgitation.   5. The aortic valve is tricuspid. Aortic valve regurgitation is not visualized. Aortic valve sclerosis/calcification is present, without any evidence of aortic stenosis.   6. The inferior vena cava is dilated in size with <50% respiratory variability, suggesting right atrial pressure of 15 mmHg.   Conclusion(s)/Recommendation(s): LV function is overall vigorous but there  is apical akinesis with evidence of an acute apical VSD with left to right  flow.     Neuro/Psych  Headaches PSYCHIATRIC DISORDERS  Depression     Neuromuscular disease    GI/Hepatic Neg liver ROS,GERD  ,,  Endo/Other  diabetes, Type 2Hypothyroidism  Morbid obesity  Renal/GU Renal disease     Musculoskeletal  (+) Arthritis ,    Abdominal  (+) + obese  Peds  Hematology  (+) Blood dyscrasia, anemia   Anesthesia Other Findings   Reproductive/Obstetrics                              Anesthesia Physical Anesthesia Plan  ASA: 3  Anesthesia Plan: General   Post-op Pain Management: Minimal or no pain anticipated and Tylenol PO (pre-op)*   Induction: Intravenous  PONV Risk Score and Plan: 3 and Dexamethasone and Treatment may vary due to age or medical condition  Airway Management Planned: Oral ETT  Additional Equipment: None  Intra-op Plan:   Post-operative Plan: Extubation in OR  Informed Consent: I have reviewed the patients History and Physical, chart, labs and discussed the procedure including the risks, benefits and alternatives for the proposed anesthesia with the patient or authorized representative who has indicated his/her understanding and acceptance.     Dental advisory given  Plan Discussed with: CRNA and Anesthesiologist  Anesthesia Plan Comments:         Anesthesia Quick Evaluation

## 2023-05-06 NOTE — Progress Notes (Addendum)
Advanced Heart Failure Rounding Note   Subjective:    Continues to have CP. Rates it as 4/10. Pain never resolved after PCI. (Not worse than post PCI) Worse with deep breathing and sitting up. Felt to have post-MI pericarditis. On colchicine on higher dose ASA  Co-ox 52% on milrinone 0.2. CVP 14  Remains SOB  Scr 1.06 -> 1.35 -> 1.45   Objective:   Weight Range:  Vital Signs:   Temp:  [97.9 F (36.6 C)-98.3 F (36.8 C)] 98.3 F (36.8 C) (07/13 1600) Pulse Rate:  [106-120] 112 (07/14 0630) Resp:  [13-36] 20 (07/14 0630) BP: (80-147)/(57-114) 87/64 (07/14 0600) SpO2:  [86 %-98 %] 93 % (07/14 0630) Weight:  [105.2 kg] 105.2 kg (07/14 0500) Last BM Date : 05/04/23  Weight change: Filed Weights   05/04/23 0551 05/05/23 0500 05/06/23 0500  Weight: 99.8 kg 103.8 kg 105.2 kg    Intake/Output:   Intake/Output Summary (Last 24 hours) at 05/06/2023 0651 Last data filed at 05/06/2023 0600 Gross per 24 hour  Intake 618.53 ml  Output 800 ml  Net -181.47 ml     Physical Exam: General:  Sitting in chair + SOB HEENT: normal Neck: supple. JVP to ear  Carotids 2+ bilat; no bruits. No lymphadenopathy or thryomegaly appreciated. Cor: PMI nondisplaced. Tachy regular. +continuous murmur Lungs: clear Abdomen: obese soft, nontender, nondistended. No hepatosplenomegaly. No bruits or masses. Good bowel sounds. Extremities: no cyanosis, clubbing, rash, tr edema Neuro: alert & orientedx3, cranial nerves grossly intact. moves all 4 extremities w/o difficulty. Affect pleasant  Telemetry: SR 110-120 Personally reviewed  Labs: Basic Metabolic Panel: Recent Labs  Lab 05/04/23 0612 05/05/23 0251 05/06/23 0255  NA 137 133* 129*  K 4.2 3.9 3.3*  CL 103 103 99  CO2 19* 19* 20*  GLUCOSE 269* 252* 277*  BUN 13 19 22*  CREATININE 1.06* 1.35* 1.45*  CALCIUM 9.3 8.7* 8.0*  MG  --  1.8 2.3    Liver Function Tests: Recent Labs  Lab 05/04/23 0612  AST 31  ALT 21  ALKPHOS 137*   BILITOT 0.4  PROT 6.3*  ALBUMIN 3.5   No results for input(s): "LIPASE", "AMYLASE" in the last 168 hours. No results for input(s): "AMMONIA" in the last 168 hours.  CBC: Recent Labs  Lab 05/04/23 0612 05/05/23 0251 05/06/23 0255  WBC 16.1* 17.6* 14.3*  NEUTROABS 8.4*  --   --   HGB 13.1 11.3* 10.1*  HCT 41.2 36.4 32.3*  MCV 90.7 89.4 90.0  PLT 292 267 239    Cardiac Enzymes: No results for input(s): "CKTOTAL", "CKMB", "CKMBINDEX", "TROPONINI" in the last 168 hours.  BNP: BNP (last 3 results) Recent Labs    05/04/23 0620  BNP 123.2*    ProBNP (last 3 results) No results for input(s): "PROBNP" in the last 8760 hours.    Other results:  Imaging: DG Chest Port 1 View  Result Date: 05/04/2023 CLINICAL DATA:  PICC line placement EXAM: PORTABLE CHEST 1 VIEW COMPARISON:  Previous studies including the examination done earlier today FINDINGS: Transverse diameter of heart is increased. Central pulmonary vessels are slightly less prominent. Increased interstitial markings in both lungs suggesting interstitial pulmonary edema. There is no focal pulmonary consolidation. Lateral CP angles are clear. There is no pneumothorax. There is interval placement of PICC line through the left upper extremity with its tip in superior vena cava. IMPRESSION: Tip of PICC line is seen in superior vena cava. There is no pneumothorax. There is decreasing  pulmonary vascular congestion. Pulmonary edema. Electronically Signed   By: Ernie Avena M.D.   On: 05/04/2023 17:20   [place] EKG SITE RITE  Result Date: 05/04/2023 If Site Rite image not attached, placement could not be confirmed due to current cardiac rhythm.  ECHOCARDIOGRAM COMPLETE  Result Date: 05/04/2023    ECHOCARDIOGRAM REPORT   Patient Name:   Ahlani Braver Date of Exam: 05/04/2023 Medical Rec #:  [id number]   Height:       64.0 in Accession #:    [id number]  Weight:       220.0 lb Date of Birth:  1966/10/05   BSA:          2.037 m  Patient Age:    57 years    BP:           84/72 mmHg Patient Gender: F           HR:           111 bpm. Exam Location:  Inpatient Procedure: 2D Echo, Cardiac Doppler, Color Doppler and Intracardiac            Opacification Agent Indications:    CHF- Acute Systolic  History:        Patient has prior history of Echocardiogram examinations, most                 recent 03/28/2022. Risk Factors:Hypertension, Dyslipidemia, Sleep                 Apnea, Hypothyroidism and Diabetes.  Sonographer:    Raeford Razor Sonographer#2:  Darlys Gales Referring Phys: [id number] MICHAEL COOPER  Sonographer Comments: Technically difficult study due to poor echo windows and patient is obese. Image acquisition challenging due to patient body habitus. IMPRESSIONS  1. Septal and apical akinesis Hyperdynamic basal function findings consistent wtih LAD infarct No mural apical thrombus with definity. Left ventricular ejection fraction, by estimation, is 45 to 50%. The left ventricle has mildly decreased function. The  left ventricle demonstrates regional wall motion abnormalities (see scoring diagram/findings for description). The left ventricular internal cavity size was mildly dilated. Left ventricular diastolic parameters were normal.  2. Right ventricular systolic function is normal. The right ventricular size is normal.  3. The mitral valve is normal in structure. No evidence of mitral valve regurgitation. No evidence of mitral stenosis.  4. The aortic valve is tricuspid. There is mild calcification of the aortic valve. There is mild thickening of the aortic valve. Aortic valve regurgitation is not visualized. Aortic valve sclerosis is present, with no evidence of aortic valve stenosis.  5. The inferior vena cava is normal in size with greater than 50% respiratory variability, suggesting right atrial pressure of 3 mmHg. FINDINGS  Left Ventricle: Septal and apical akinesis Hyperdynamic basal function findings consistent wtih LAD infarct No mural  apical thrombus with definity. Left ventricular ejection fraction, by estimation, is 45 to 50%. The left ventricle has mildly decreased function. The left ventricle demonstrates regional wall motion abnormalities. Definity contrast agent was given IV to delineate the left ventricular endocardial borders. The left ventricular internal cavity size was mildly dilated. There is no left ventricular hypertrophy. Left ventricular diastolic parameters were normal. Right Ventricle: The right ventricular size is normal. No increase in right ventricular wall thickness. Right ventricular systolic function is normal. Left Atrium: Left atrial size was normal in size. Right Atrium: Right atrial size was normal in size. Pericardium: There is no evidence of pericardial effusion. Mitral Valve: The mitral valve  is normal in structure. No evidence of mitral valve regurgitation. No evidence of mitral valve stenosis. Tricuspid Valve: The tricuspid valve is normal in structure. Tricuspid valve regurgitation is not demonstrated. No evidence of tricuspid stenosis. Aortic Valve: The aortic valve is tricuspid. There is mild calcification of the aortic valve. There is mild thickening of the aortic valve. Aortic valve regurgitation is not visualized. Aortic valve sclerosis is present, with no evidence of aortic valve stenosis. Aortic valve peak gradient measures 7.5 mmHg. Pulmonic Valve: The pulmonic valve was normal in structure. Pulmonic valve regurgitation is not visualized. No evidence of pulmonic stenosis. Aorta: The aortic root is normal in size and structure. Venous: The inferior vena cava is normal in size with greater than 50% respiratory variability, suggesting right atrial pressure of 3 mmHg. IAS/Shunts: No atrial level shunt detected by color flow Doppler.  LEFT VENTRICLE PLAX 2D LVIDd:         5.10 cm      Diastology LVIDs:         3.70 cm      LV e' medial:    8.70 cm/s LV PW:         0.80 cm      LV E/e' medial:  11.6 LV IVS:         0.80 cm      LV e' lateral:   15.20 cm/s LVOT diam:     1.80 cm      LV E/e' lateral: 6.6 LV SV:         22 LV SV Index:   11 LVOT Area:     2.54 cm  LV Volumes (MOD) LV vol d, MOD A2C: 122.0 ml LV vol d, MOD A4C: 116.0 ml LV vol s, MOD A2C: 56.5 ml LV vol s, MOD A4C: 54.7 ml LV SV MOD A2C:     65.5 ml LV SV MOD A4C:     116.0 ml LV SV MOD BP:      66.2 ml RIGHT VENTRICLE RV Basal diam:  2.60 cm RV S prime:     12.00 cm/s TAPSE (M-mode): 1.4 cm LEFT ATRIUM             Index        RIGHT ATRIUM          Index LA diam:        3.60 cm 1.77 cm/m   RA Area:     7.55 cm LA Vol (A2C):   25.1 ml 12.32 ml/m  RA Volume:   12.10 ml 5.94 ml/m LA Vol (A4C):   17.8 ml 8.74 ml/m LA Biplane Vol: 21.1 ml 10.36 ml/m  AORTIC VALVE AV Area (Vmax): 1.31 cm AV Vmax:        137.00 cm/s AV Peak Grad:   7.5 mmHg LVOT Vmax:      70.70 cm/s LVOT Vmean:     44.200 cm/s LVOT VTI:       0.088 m  AORTA Ao Root diam: 3.10 cm Ao Asc diam:  3.50 cm MITRAL VALVE MV Area (PHT): 4.21 cm     SHUNTS MV Decel Time: 180 msec     Systemic VTI:  0.09 m MV E velocity: 101.00 cm/s  Systemic Diam: 1.80 cm MV A velocity: 120.00 cm/s MV E/A ratio:  0.84 Charlton Haws MD Electronically signed by Charlton Haws MD Signature Date/Time: 05/04/2023/12:35:25 PM    Final    CARDIAC CATHETERIZATION  Result Date: 05/04/2023   Prox RCA lesion is 40%  stenosed.   Prox LAD to Mid LAD lesion is 99% stenosed.   A drug-eluting stent was successfully placed using a STENT SYNERGY XD 3.0X32.   Post intervention, there is a 0% residual stenosis.   LV end diastolic pressure is moderately elevated.   Recommend uninterrupted dual antiplatelet therapy with Aspirin 81mg  daily and Ticagrelor 90mg  twice daily for a minimum of 12 months (ACS-Class I recommendation). 1.  Severe single-vessel disease with subtotal occlusion of the proximal LAD, 99% stenosis reduced to 0% with PTCA and stenting using a 3.0 x 32 mm Synergy DES.  TIMI I flow at baseline, TIMI-3 flow post procedure.  2.  Patent left main, left circumflex, and dominant RCA with mild nonobstructive plaquing in those vessels 3.  Moderately elevated LVEDP of 22 mmHg 4.  Poorly visualized LV function by ventriculography, but basal to mid segments appear hyperdynamic, possible hypokinesis of the distal anterior wall and apex.  Unable to estimate LVEF.  2D echo to be done for further assessment Recommendations: CV-ICU care.  DAPT with aspirin and ticagrelor x 12 months.  Aggressive post MI medical therapy. Needs close evaluation, consider diuresis. LVEDP 24 mmHg, lactate 2.9, SBP > 90 on no support, but narrow pulse pressure.     Medications:     Scheduled Medications:  amitriptyline  50 mg Oral QHS   aspirin  325 mg Oral BID   Chlorhexidine Gluconate Cloth  6 each Topical Daily   colchicine  0.6 mg Oral BID   digoxin  0.125 mg Oral Daily   DULoxetine  60 mg Oral BID   enoxaparin (LOVENOX) injection  40 mg Subcutaneous Q24H   gabapentin  600 mg Oral TID   insulin aspart  0-15 Units Subcutaneous TID WC   insulin aspart  0-5 Units Subcutaneous QHS   levothyroxine  25 mcg Oral Daily   linaclotide  145 mcg Oral QAC breakfast   magnesium oxide  400 mg Oral Daily   pantoprazole  40 mg Oral BID AC   rosuvastatin  40 mg Oral Daily   senna  2 tablet Oral BID   sodium chloride flush  10-40 mL Intracatheter Q12H   sodium chloride flush  3 mL Intravenous Q12H   tapentadol  50 mg Oral TID   ticagrelor  90 mg Oral BID   topiramate  100 mg Oral Daily    Infusions:  sodium chloride 20 mL/hr at 05/04/23 0631   sodium chloride Stopped (05/04/23 1630)   milrinone 0.2 mcg/kg/min (05/06/23 0600)   vasopressin      PRN Medications: sodium chloride, acetaminophen, albuterol, HYDROcodone-acetaminophen, lidocaine, methocarbamol, morphine injection, ondansetron (ZOFRAN) IV, sodium chloride flush, sodium chloride flush    Patient Profile    Eileene Yzquierdo is a 57 y.o. female with osteomyelitis, neurocardiogenic syncope ,  wegener's disease, chronic pain, DVT and PE 6/23 s/p IVC filter + eliquis, obesity and diabetes. Admitted with STEMI s/p PDI to LAD now in cardiogenic shock.    Assessment/Plan:   Cardiogenic shock -> acute HF with mildly-reduced EF - SCAI stage C, initial lactic acid 3.2 - Echo 6/23 EF 55%, no RWMA, normal RV, trivial MR - Echo 7/12,  EF 45% - On milrinone 0.2. Co-ox 52%. - CVP 14 - Has continuous murmur on exam. Concern for acute VSD. Stat echo  - BP too low for GDMT.  - Continue digoxin (will check level in am with AKI) - strict I&O, daily weights   Anterior STEMI, CAD - HsTrop >24K on admission - s/p  PCI to LAD 7/12, post DES 0% residual stenosis. Medical management of 40% stenosed RCA.  - plan for DAPT with ASA and Ticagrelor x12 months - Continue statin, LDL unable to be calculated with high TGD - Ongoing CP seems to be related to pericarditis.  - ECG with persistent ST elevation. Need to watch closely for mechanical complications. REpeat limited echo today - Consult CR  3. Post-MI chest pain -possible pericarditis - rub no longer present. Now with continuous murmur on exam -> ? VSD - Continue colchicine - Drop ASA tback to 81   DM II - A1c last 7.6 3/24, update - SSI while admitted per primary - has diabetic foot ulcer, WOC following - ABI 12/22 were normal   Obesity - Body mass index is 37.76 kg/m.  - consider GLP-1 as OP   OSA - does not use CPAP   HLD - Continue statin    CRITICAL CARE Performed by: Arvilla Meres  Total critical care time: 45 minutes  Critical care time was exclusive of separately billable procedures and treating other patients.  Critical care was necessary to treat or prevent imminent or life-threatening deterioration.  Critical care was time spent personally by me (independent of midlevel providers or residents) on the following activities: development of treatment plan with patient and/or surrogate as well as nursing,  discussions with consultants, evaluation of patient's response to treatment, examination of patient, obtaining history from patient or surrogate, ordering and performing treatments and interventions, ordering and review of laboratory studies, ordering and review of radiographic studies, pulse oximetry and re-evaluation of patient's condition.    Length of Stay: 2   Arvilla Meres MD 05/06/2023, 6:51 AM  Advanced Heart Failure Team Pager [phone] (M-F; 7a - 4p)  Please contact [facility] for night-coverage after hours (4p -7a ) and weekends on [email or web address]

## 2023-05-06 NOTE — Plan of Care (Signed)
  Problem: Education: Goal: Knowledge of General Education information will improve Description: Including pain rating scale, medication(s)/side effects and non-pharmacologic comfort measures Outcome: Progressing   Problem: Health Behavior/Discharge Planning: Goal: Ability to manage health-related needs will improve Outcome: Progressing   Problem: Clinical Measurements: Goal: Ability to maintain clinical measurements within normal limits will improve Outcome: Progressing Goal: Will remain free from infection Outcome: Not Progressing Goal: Respiratory complications will improve Outcome: Not Progressing Goal: Cardiovascular complication will be avoided Outcome: Not Progressing   Problem: Activity: Goal: Risk for activity intolerance will decrease Outcome: Not Progressing   Problem: Nutrition: Goal: Adequate nutrition will be maintained Outcome: Progressing   Problem: Coping: Goal: Level of anxiety will decrease Outcome: Progressing   Problem: Elimination: Goal: Will not experience complications related to bowel motility Outcome: Progressing Goal: Will not experience complications related to urinary retention Outcome: Progressing   Problem: Pain Managment: Goal: General experience of comfort will improve Outcome: Progressing   Problem: Safety: Goal: Ability to remain free from injury will improve Outcome: Progressing   Problem: Skin Integrity: Goal: Risk for impaired skin integrity will decrease Outcome: Progressing

## 2023-05-06 NOTE — Interval H&P Note (Signed)
History and Physical Interval Note:  05/06/2023 9:22 PM  [name]  has presented today for surgery, with the diagnosis of STEMI.  The various methods of treatment have been discussed with the patient and family. After consideration of risks, benefits and other options for treatment, the patient has consented to  Procedure(s): IABP Insertion (N/A) as a surgical intervention.  The patient's history has been reviewed, patient examined, no change in status, stable for surgery.  I have reviewed the patient's chart and labs.  Questions were answered to the patient's satisfaction.     [name]

## 2023-05-06 NOTE — H&P (View-Only) (Signed)
   Now on milrinone 0.375. Co-ox dropping. Now 44%. Lactic acid running 2.5-3.7.   Scr worsening.    Discussed case with IC team and TCTS.   Will take to cath lab for PA catheter and IABP in an attempt to temporize her hemodynamics and give infarct area time to fibrose prior to attempted VSD repair.  Will need a close look at femoral and iliac anatomy to make sure suitable for IAB without high-risk for ;imb ischemia. Axillary approach could be bail out option, if needed.   Discussed with patient and her husband.   Additional CCT 45 mins  Arvilla Meres, MD  9:21 PM

## 2023-05-06 NOTE — Consult Note (Signed)
[address]       [place] [address]             [phone]        [name] [facility] Medical Record #[id number] Date of Birth: October 23, 1966  Referring: No ref. provider found Primary Care: [name], [name] Primary Cardiologist:Peter Swaziland, MD  Chief Complaint:    Chief Complaint  Patient presents with   Chest Pain    History of Present Illness:     [name] 57 y.o. 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  She has a hx of DM and presented with an anterior STEMI.  She underwent PCI to the LAD with a good result.  Advanced heart failure team was consulted due to persistent hypotension and tachycardia, and she was started on chemical support.  Bedside echo showed the VSD.  CT surgery has been consulted to assist with management.   Past Medical and Surgical History: Previous Chest Surgery: no Previous Chest Radiation: no Diabetes Mellitus: yes.  HbA1C 7.6 Creatinine:  Lab Results  Component Value Date   CREATININE 1.45 (H) 05/06/2023   CREATININE 1.35 (H) 05/05/2023   CREATININE 1.06 (H) 05/04/2023     Past Medical History:  Diagnosis Date   Chronic cough    Chronic pain    Diabetes mellitus without complication (HCC)    Dyspnea    GERD (gastroesophageal reflux disease)    Hypokalemia    Hypothyroidism    Left wrist pain 06/01/2021   Myonecrosis (HCC) 07/01/2021   Neurocardiogenic syncope    OSA (obstructive sleep apnea)    does not use CPAP   Osteomyelitis (HCC)    bilateral feet   Peripheral neuropathy    Presence of intrathecal pump    recieves Prialt/bupivicaine   Tear of gluteus medius tendon 07/01/2021   Wegener's granulomatosis 2009   Wegner's disease (congenital syphilitic osteochondritis)     Past Surgical History:  Procedure Laterality Date   AMPUTATION Right 10/21/2020   Procedure: Right hallux amputation;  Surgeon: Toni Arthurs, MD;  Location: [facility];  Service:  Orthopedics;  Laterality: Right;   AMPUTATION TOE Bilateral 08/12/2020   Procedure: Right 3rd toe amputation; left hallux and 3rd toe amputation;  Surgeon: Toni Arthurs, MD;  Location: [facility];  Service: Orthopedics;  Laterality: Bilateral;    AMPUTATION TOE Right 03/24/2021   Procedure: AMPUTATION TOE;  Surgeon: Toni Arthurs, MD;  Location: [facility];  Service: Orthopedics;  Laterality: Right;   APPENDECTOMY     BACK SURGERY     BIOPSY  03/30/2022   Procedure: BIOPSY;  Surgeon: Tomma Lightning, MD;  Location: [facility];  Service: Pulmonary;;   BRONCHIAL WASHINGS  03/30/2022   Procedure: BRONCHIAL WASHINGS;  Surgeon: Tomma Lightning, MD;  Location: [facility];  Service: Pulmonary;;   CHOLECYSTECTOMY     COLONOSCOPY N/A 06/02/2013   Procedure: COLONOSCOPY;  Surgeon: Graylin Shiver, MD;  Location: [facility];  Service: Endoscopy;  Laterality: N/A;   HERNIA REPAIR  2000   INCISION AND DRAINAGE ABSCESS Left 07/07/2016   Procedure: DEBRIDMENT LEFT THIGH ABSCESS, EXISION ACUTE SKIN RASH LEFT THIGH(1CM LESION);  Surgeon: Claud Kelp, MD;  Location: [facility];  Service: General;  Laterality: Left;   IR FLUORO GUIDE CV LINE RIGHT  04/24/2022   IR IVC FILTER PLMT / S&I /IMG GUID/MOD SED  05/17/2022   IR US GUIDE VASC ACCESS RIGHT  04/24/2022  SEPTOPLASTY  02/2013   spleen anuyism     splenic aneurysm     TRANSMETATARSAL AMPUTATION Bilateral 10/21/2020   Procedure: Left transmetatarsal amputation;  Surgeon: Toni Arthurs, MD;  Location: [facility];  Service: Orthopedics;  Laterality: Bilateral;   TRANSMETATARSAL AMPUTATION Left 03/24/2021   Procedure: TRANSMETATARSAL AMPUTATION;  Surgeon: Toni Arthurs, MD;  Location: [facility];  Service: Orthopedics;  Laterality: Left;   VAGINAL HYSTERECTOMY     VIDEO BRONCHOSCOPY Right 03/30/2022   Procedure: VIDEO BRONCHOSCOPY WITH FLUORO;  Surgeon: Tomma Lightning, MD;  Location: [facility];  Service: Pulmonary;  Laterality: Right;   atypical pneumonia    Social History:  Social History   Tobacco Use  Smoking Status Never  Smokeless Tobacco Never    Social History   Substance and Sexual Activity  Alcohol Use Yes   Comment: occasionally     Allergies  Allergen Reactions   Ibuprofen Other (See Comments)    Pt is unable to take this due to kidney problems.      Penicillins Rash and Other (See Comments)    Has patient had a PCN reaction causing immediate rash, facial/tongue/throat swelling, SOB or lightheadedness with hypotension: No Has patient had a PCN reaction causing severe rash involving mucus membranes or skin necrosis: No Has patient had a PCN reaction that required hospitalization No Has patient had a PCN reaction occurring within the last 10 years: No If all of the above answers are "NO", then may proceed with Cephalosporin use.    Medications:  Anti-Coagulation: brilinta  Current Facility-Administered Medications  Medication Dose Route Frequency Provider Last Rate Last Admin   0.9 %  sodium chloride infusion   Intravenous Continuous Tonny Bollman, MD 20 mL/hr at 05/04/23 0631 New Bag at 05/04/23 0631   0.9 %  sodium chloride infusion  250 mL Intravenous PRN Tonny Bollman, MD   Stopped at 05/04/23 1630   acetaminophen (TYLENOL) tablet 650 mg  650 mg Oral Q6H PRN Tonny Bollman, MD   650 mg at 05/04/23 0837   albuterol (PROVENTIL) (2.5 MG/3ML) 0.083% nebulizer solution 2.5 mg  2.5 mg Nebulization Q6H PRN Tonny Bollman, MD   2.5 mg at 05/05/23 1835   amitriptyline (ELAVIL) tablet 50 mg  50 mg Oral Mickel Crow, MD   50 mg at 05/05/23 2240   aspirin tablet 325 mg  325 mg Oral BID Bensimhon, Bevelyn Buckles, MD   325 mg at 05/05/23 2240   Chlorhexidine Gluconate Cloth 2 % PADS 6 each  6 each Topical Daily Tonny Bollman, MD   6 each at 05/05/23 1015   colchicine tablet 0.6 mg  0.6 mg Oral BID Bensimhon, Bevelyn Buckles, MD   0.6 mg at 05/05/23 2240   digoxin (LANOXIN) tablet 0.125 mg  0.125 mg  Oral Daily Bensimhon, Bevelyn Buckles, MD   0.125 mg at 05/05/23 1156   DULoxetine (CYMBALTA) DR capsule 60 mg  60 mg Oral BID Tonny Bollman, MD   60 mg at 05/05/23 2240   enoxaparin (LOVENOX) injection 40 mg  40 mg Subcutaneous Q24H Tonny Bollman, MD   40 mg at 05/06/23 0847   gabapentin (NEURONTIN) capsule 600 mg  600 mg Oral TID Tonny Bollman, MD   600 mg at 05/05/23 2240   HYDROcodone-acetaminophen (NORCO/VICODIN) 5-325 MG per tablet 1 tablet  1 tablet Oral BID PRN Tonny Bollman, MD   1 tablet at 05/05/23 1956   insulin aspart (novoLOG) injection 0-15 Units  0-15 Units Subcutaneous TID WC Allyson Sabal,  Delton See, MD   8 Units at 05/06/23 0902   insulin aspart (novoLOG) injection 0-5 Units  0-5 Units Subcutaneous QHS Runell Gess, MD   2 Units at 05/05/23 2240   levothyroxine (SYNTHROID) tablet 25 mcg  25 mcg Oral Daily Tonny Bollman, MD   25 mcg at 05/06/23 0600   lidocaine (LIDODERM) 5 % 1 patch  1 patch Transdermal Daily PRN Tonny Bollman, MD   1 patch at 05/05/23 1956   linaclotide (LINZESS) capsule 145 mcg  145 mcg Oral QAC breakfast Tonny Bollman, MD   145 mcg at 05/05/23 0830   magnesium oxide (MAG-OX) tablet 400 mg  400 mg Oral Daily Tonny Bollman, MD   400 mg at 05/05/23 1013   methocarbamol (ROBAXIN) tablet 500 mg  500 mg Oral Q6H PRN Tonny Bollman, MD   500 mg at 05/05/23 2240   milrinone (PRIMACOR) 20 MG/100 ML (0.2 mg/mL) infusion  0.2 mcg/kg/min Intravenous Continuous Bensimhon, Bevelyn Buckles, MD 5.99 mL/hr at 05/06/23 0600 0.2 mcg/kg/min at 05/06/23 0600   morphine (PF) 2 MG/ML injection 2 mg  2 mg Intravenous Q1H PRN Tonny Bollman, MD   2 mg at 05/05/23 2249   ondansetron (ZOFRAN) injection 4 mg  4 mg Intravenous Q6H PRN Tonny Bollman, MD       pantoprazole (PROTONIX) EC tablet 40 mg  40 mg Oral BID Hilbert Bible, MD   40 mg at 05/06/23 0846   rosuvastatin (CRESTOR) tablet 40 mg  40 mg Oral Daily Tonny Bollman, MD   40 mg at 05/05/23 1013   senna (SENOKOT) tablet  17.2 mg  2 tablet Oral BID Tonny Bollman, MD   17.2 mg at 05/05/23 1013   sodium chloride flush (NS) 0.9 % injection 10-40 mL  10-40 mL Intracatheter Q12H Tonny Bollman, MD   10 mL at 05/05/23 2241   sodium chloride flush (NS) 0.9 % injection 10-40 mL  10-40 mL Intracatheter PRN Tonny Bollman, MD       sodium chloride flush (NS) 0.9 % injection 3 mL  3 mL Intravenous Q12H Tonny Bollman, MD   3 mL at 05/05/23 2241   sodium chloride flush (NS) 0.9 % injection 3 mL  3 mL Intravenous PRN Tonny Bollman, MD       tapentadol (NUCYNTA) tablet 50 mg  50 mg Oral TID Bensimhon, Bevelyn Buckles, MD   50 mg at 05/05/23 2240   ticagrelor (BRILINTA) tablet 90 mg  90 mg Oral BID Tonny Bollman, MD   90 mg at 05/05/23 2240   topiramate (TOPAMAX) tablet 100 mg  100 mg Oral Daily Tonny Bollman, MD   100 mg at 05/05/23 1013   vasopressin (PITRESSIN) 20 Units in sodium chloride 0.9 % 100 mL infusion-*FOR SHOCK*  0-0.03 Units/min Intravenous Continuous Sabharwal, Aditya, DO        Medications Prior to Admission  Medication Sig Dispense Refill Last Dose   acetaminophen (TYLENOL) 325 MG tablet Take 2 tablets (650 mg total) by mouth every 6 (six) hours as needed for mild pain (or Fever >/= 101).   Past Week   albuterol (VENTOLIN HFA) 108 (90 Base) MCG/ACT inhaler Inhale 2 puffs into the lungs every 6 (six) hours as needed for wheezing or shortness of breath. 1 each 11 unknown   amitriptyline (ELAVIL) 50 MG tablet TAKE 1 TABLET BY MOUTH AT BEDTIME FOR SLEEP. (Patient taking differently: Take 50 mg by mouth at bedtime. TAKE 1 TABLET BY MOUTH AT BEDTIME FOR SLEEP.) 30 tablet 0  05/03/2023   aspirin EC 81 MG tablet Take 162 mg by mouth daily. Swallow whole.   05/04/2023   Calcium Carb-Cholecalciferol (CALCIUM CARBONATE-VITAMIN D3 PO) Take 1 tablet by mouth daily.   05/03/2023   cyanocobalamin (VITAMIN B12) 1000 MCG tablet Take 1,000 mcg by mouth daily.   05/03/2023   DULoxetine (CYMBALTA) 60 MG capsule Take 60 mg by mouth 2  (two) times daily.   05/03/2023   levothyroxine (SYNTHROID) 25 MCG tablet Take 1 tablet (25 mcg total) by mouth daily. 30 tablet 0 05/03/2023   LINZESS 145 MCG CAPS capsule Take one capsule (145 mcg dose) by mouth daily. 30 capsule 0 Past Month   magnesium oxide (MAG-OX) 400 MG tablet Take 400 mg by mouth daily.   Past Month   metFORMIN (GLUCOPHAGE) 500 MG tablet Take 1 tablet (500 mg total) by mouth 2 (two) times daily with a meal. 180 tablet 3 05/03/2023   methocarbamol (ROBAXIN) 500 MG tablet Take 1 tablet (500 mg total) by mouth every 6 (six) hours as needed for muscle spasms. 30 tablet 0 Past Week   naloxone (NARCAN) nasal spray 4 mg/0.1 mL Place 0.4 mg into the nose once.   unknown   NUCYNTA 50 MG tablet Take 50 mg by mouth 3 (three) times daily.   05/03/2023   pantoprazole (PROTONIX) 40 MG tablet TAKE 1 TABLET BY MOUTH TWICE DAILY BEFORE A MEAL 30 tablet 0 05/03/2023   rosuvastatin (CRESTOR) 10 MG tablet TAKE 1 TABLET BY MOUTH AT BEDTIME 30 tablet 0 05/03/2023   topiramate (TOPAMAX) 100 MG tablet TAKE (1) TABLET BY MOUTH ONCE DAILY. 30 tablet 0 05/03/2023   Vitamin D, Ergocalciferol, (DRISDOL) 1.25 MG (50000 UNIT) CAPS capsule Take 1 capsule (50,000 Units total) by mouth every Friday. 7 capsule 3 Past Week   gabapentin (NEURONTIN) 300 MG capsule Take 1 capsule (300 mg total) by mouth at bedtime. (Patient taking differently: Take 600 mg by mouth 3 (three) times daily. Patient is taking 2 in the am, 2 in the afternoon, and 2 at bedtime.) 90 capsule 3    lidocaine (LIDODERM) 5 % Place 1 patch onto the skin daily as needed (for pain). Remove & Discard patch within 12 hours or as directed by MD       Family History  Problem Relation Age of Onset   Diabetes Mother    Heart disease Mother    Diabetes Father      Review of Systems:   Review of Systems  Constitutional:  Positive for malaise/fatigue. Negative for weight loss.  Respiratory:  Positive for shortness of breath.   Cardiovascular:   Positive for chest pain.      Physical Exam: BP 108/78   Pulse (!) 115   Temp 98.3 F (36.8 C) (Oral)   Resp (!) 26   Ht 5\' 4"  (1.626 m)   Wt 105.2 kg   SpO2 95%   BMI 39.81 kg/m  Physical Exam Constitutional:      General: She is not in acute distress.    Appearance: She is not ill-appearing or toxic-appearing.  Eyes:     Extraocular Movements: Extraocular movements intact.  Pulmonary:     Effort: Pulmonary effort is normal.  Neurological:     General: No focal deficit present.     Mental Status: She is alert and oriented to person, place, and time.        I have independently reviewed the above radiologic studies and discussed with the patient   Recent  Lab Findings: Lab Results  Component Value Date   WBC 14.3 (H) 05/06/2023   HGB 10.1 (L) 05/06/2023   HCT 32.3 (L) 05/06/2023   PLT 239 05/06/2023   GLUCOSE 277 (H) 05/06/2023   CHOL 165 05/04/2023   TRIG 401 (H) 05/04/2023   HDL 36 (L) 05/04/2023   LDLDIRECT 65 05/04/2023   LDLCALC UNABLE TO CALCULATE IF TRIGLYCERIDE OVER 400 mg/dL 30/86/5784   ALT 21 69/62/9528   AST 31 05/04/2023   NA 129 (L) 05/06/2023   K 3.3 (L) 05/06/2023   CL 99 05/06/2023   CREATININE 1.45 (H) 05/06/2023   BUN 22 (H) 05/06/2023   CO2 20 (L) 05/06/2023   TSH 4.210 01/04/2023   INR 1.0 05/04/2023   HGBA1C 7.6 (H) 05/04/2023      Assessment / Plan:   57yo female with post-infarct VSD.  She had a DES placed to the LAD on 7/12, and is currently on brilinta.  Echo was reviewed, and the VSD is very apical.  She remains symptomatic with some shortness of breath, and chest pain.  I would like to discuss with structural team to see if she is a candidate for a percutaneous closure device.  If not, we would need to consider open repair, which will be difficult given her new stent.  We also would need to consider options for mechanical support if she develops worsening LV failure after open repair.       Corliss Skains 05/06/2023  9:23 AM

## 2023-05-06 NOTE — Progress Notes (Signed)
   Echo reviewed. She has large apical VSD.   Currently improved but remains tenuous on milrinone 0.2 with co-ox 52%and lactic acid 2.8 -> 2.5   I have reviewed the case with Drs. Lightfoot and Thukkani to discuss surgical vs percutaneous closure.   She would be high risk for surgery and also need Brilinta washout.   Will plan TEE tomorrow to further evaluate.   Can consider IABP bridge but has diabetes and PAD (bilateral transmetatarsal amputations) but ABIs ok in 12/22.   Will try to increase milrinone to further reduce afterload but I worry that she is at high risk for AF with milrinone.   Will start IV amio to help avoid AF.   I updated her and her husband in detail.   NPO after MN  Additional CCT.  60 mins  Arvilla Meres, MD  11:25 AM

## 2023-05-06 NOTE — Progress Notes (Signed)
   05/06/23 2122  Spiritual Encounters  Type of Visit Initial  Referral source Code page  Reason for visit Code  OnCall Visit Yes   Chaplain responded to Code STEMI page (4 pages, actually).  Arriving in the room I learned there was no actual code.  No explanation for why the STEMI codes were issued. Chaplain services remain available by paging Spiritual Care

## 2023-05-07 ENCOUNTER — Encounter (HOSPITAL_COMMUNITY)
Admission: EM | Disposition: E | Payer: Self-pay | Source: Home / Self Care | Attending: Thoracic Surgery (Cardiothoracic Vascular Surgery)

## 2023-05-07 ENCOUNTER — Inpatient Hospital Stay (HOSPITAL_COMMUNITY): Payer: BC Managed Care – PPO

## 2023-05-07 ENCOUNTER — Encounter (HOSPITAL_COMMUNITY): Payer: Self-pay | Admitting: Cardiovascular Disease

## 2023-05-07 DIAGNOSIS — I232 Ventricular septal defect as current complication following acute myocardial infarction: Secondary | ICD-10-CM | POA: Diagnosis not present

## 2023-05-07 DIAGNOSIS — I2102 ST elevation (STEMI) myocardial infarction involving left anterior descending coronary artery: Secondary | ICD-10-CM | POA: Diagnosis not present

## 2023-05-07 DIAGNOSIS — R570 Cardiogenic shock: Secondary | ICD-10-CM | POA: Diagnosis not present

## 2023-05-07 LAB — CBC
HCT: 29.9 % — ABNORMAL LOW (ref 36.0–46.0)
Hemoglobin: 9.6 g/dL — ABNORMAL LOW (ref 12.0–15.0)
MCH: 28.9 pg (ref 26.0–34.0)
MCHC: 32.1 g/dL (ref 30.0–36.0)
MCV: 90.1 fL (ref 80.0–100.0)
Platelets: 226 10*3/uL (ref 150–400)
RBC: 3.32 MIL/uL — ABNORMAL LOW (ref 3.87–5.11)
RDW: 14.4 % (ref 11.5–15.5)
WBC: 11.6 10*3/uL — ABNORMAL HIGH (ref 4.0–10.5)
nRBC: 0.3 % — ABNORMAL HIGH (ref 0.0–0.2)

## 2023-05-07 LAB — BASIC METABOLIC PANEL
Anion gap: 15 (ref 5–15)
BUN: 27 mg/dL — ABNORMAL HIGH (ref 6–20)
CO2: 19 mmol/L — ABNORMAL LOW (ref 22–32)
Calcium: 7.7 mg/dL — ABNORMAL LOW (ref 8.9–10.3)
Chloride: 97 mmol/L — ABNORMAL LOW (ref 98–111)
Creatinine, Ser: 1.64 mg/dL — ABNORMAL HIGH (ref 0.44–1.00)
GFR, Estimated: 36 mL/min — ABNORMAL LOW (ref >60)
Glucose, Bld: 311 mg/dL — ABNORMAL HIGH (ref 70–99)
Potassium: 3.7 mmol/L (ref 3.5–5.1)
Sodium: 131 mmol/L — ABNORMAL LOW (ref 135–145)

## 2023-05-07 LAB — POCT I-STAT EG7
Acid-base deficit: 10 mmol/L — ABNORMAL HIGH (ref 0.0–2.0)
Acid-base deficit: 9 mmol/L — ABNORMAL HIGH (ref 0.0–2.0)
Bicarbonate: 15.5 mmol/L — ABNORMAL LOW (ref 20.0–28.0)
Bicarbonate: 17.4 mmol/L — ABNORMAL LOW (ref 20.0–28.0)
Calcium, Ion: 0.97 mmol/L — ABNORMAL LOW (ref 1.15–1.40)
Calcium, Ion: 0.98 mmol/L — ABNORMAL LOW (ref 1.15–1.40)
HCT: 28 % — ABNORMAL LOW (ref 36.0–46.0)
HCT: 28 % — ABNORMAL LOW (ref 36.0–46.0)
Hemoglobin: 9.5 g/dL — ABNORMAL LOW (ref 12.0–15.0)
Hemoglobin: 9.5 g/dL — ABNORMAL LOW (ref 12.0–15.0)
O2 Saturation: 38 %
O2 Saturation: 75 %
Potassium: 4.1 mmol/L (ref 3.5–5.1)
Potassium: 4.2 mmol/L (ref 3.5–5.1)
Sodium: 131 mmol/L — ABNORMAL LOW (ref 135–145)
Sodium: 131 mmol/L — ABNORMAL LOW (ref 135–145)
TCO2: 16 mmol/L — ABNORMAL LOW (ref 22–32)
TCO2: 19 mmol/L — ABNORMAL LOW (ref 22–32)
pCO2, Ven: 31.4 mmHg — ABNORMAL LOW (ref 44–60)
pCO2, Ven: 36.8 mmHg — ABNORMAL LOW (ref 44–60)
pH, Ven: 7.283 (ref 7.25–7.43)
pH, Ven: 7.302 (ref 7.25–7.43)
pO2, Ven: 25 mmHg — CL (ref 32–45)
pO2, Ven: 44 mmHg (ref 32–45)

## 2023-05-07 LAB — COOXEMETRY PANEL
Carboxyhemoglobin: 2.2 % — ABNORMAL HIGH (ref 0.5–1.5)
Carboxyhemoglobin: 1.1 % (ref 0.5–1.5)
Methemoglobin: <0.7 % (ref 0.0–1.5)
Methemoglobin: <0.7 % (ref 0.0–1.5)
O2 Saturation: 51.8 %
O2 Saturation: 51.9 %
Total hemoglobin: 9.9 g/dL — ABNORMAL LOW (ref 12.0–16.0)
Total hemoglobin: 12.9 g/dL (ref 12.0–16.0)

## 2023-05-07 LAB — GLUCOSE, CAPILLARY
Glucose-Capillary: 271 mg/dL — ABNORMAL HIGH (ref 70–99)
Glucose-Capillary: 272 mg/dL — ABNORMAL HIGH (ref 70–99)
Glucose-Capillary: 267 mg/dL — ABNORMAL HIGH (ref 70–99)
Glucose-Capillary: 269 mg/dL — ABNORMAL HIGH (ref 70–99)
Glucose-Capillary: 296 mg/dL — ABNORMAL HIGH (ref 70–99)

## 2023-05-07 LAB — LACTIC ACID, PLASMA
Lactic Acid, Venous: 1.9 mmol/L (ref 0.5–1.9)
Lactic Acid, Venous: 1.6 mmol/L (ref 0.5–1.9)

## 2023-05-07 LAB — HEPARIN LEVEL (UNFRACTIONATED)
Heparin Unfractionated: 0.25 IU/mL — ABNORMAL LOW (ref 0.30–0.70)
Heparin Unfractionated: 0.21 IU/mL — ABNORMAL LOW (ref 0.30–0.70)

## 2023-05-07 LAB — APTT: aPTT: 44 seconds — ABNORMAL HIGH (ref 24–36)

## 2023-05-07 SURGERY — TRANSESOPHAGEAL ECHOCARDIOGRAM (TEE)
Anesthesia: Monitor Anesthesia Care

## 2023-05-07 MED ORDER — FUROSEMIDE 10 MG/ML IJ SOLN
80.0000 mg | Freq: Once | INTRAMUSCULAR | Status: AC
  Administered 2023-05-07: 80 mg via INTRAVENOUS
  Filled 2023-05-07: qty 8

## 2023-05-07 MED ORDER — INSULIN GLARGINE-YFGN 100 UNIT/ML ~~LOC~~ SOLN
15.0000 [IU] | Freq: Every day | SUBCUTANEOUS | Status: DC
  Administered 2023-05-07: 15 [IU] via SUBCUTANEOUS
  Filled 2023-05-07 (×2): qty 0.15

## 2023-05-07 MED ORDER — PROPOFOL 10 MG/ML IV BOLUS: 100.0000 mg | Freq: Once | INTRAVENOUS | Status: DC

## 2023-05-07 MED ORDER — POTASSIUM CHLORIDE CRYS ER 20 MEQ PO TBCR
40.0000 meq | EXTENDED_RELEASE_TABLET | Freq: Once | ORAL | Status: AC
  Administered 2023-05-07: 40 meq via ORAL
  Filled 2023-05-07: qty 2

## 2023-05-07 MED ORDER — METOLAZONE 2.5 MG PO TABS
2.5000 mg | ORAL_TABLET | Freq: Once | ORAL | Status: AC
  Administered 2023-05-07: 2.5 mg via ORAL
  Filled 2023-05-07: qty 1

## 2023-05-07 MED ORDER — POTASSIUM CHLORIDE 10 MEQ/50ML IV SOLN
10.0000 meq | INTRAVENOUS | Status: AC
  Administered 2023-05-07 (×4): 10 meq via INTRAVENOUS
  Filled 2023-05-07 (×4): qty 50

## 2023-05-07 MED ORDER — PROPOFOL 10 MG/ML IV BOLUS
100.0000 mg | Freq: Once | INTRAVENOUS | Status: AC
  Administered 2023-05-07: 90 mg via INTRAVENOUS
  Filled 2023-05-07: qty 20

## 2023-05-07 MED ORDER — NOREPINEPHRINE 4 MG/250ML-% IV SOLN
0.0000 ug/min | INTRAVENOUS | Status: DC
  Administered 2023-05-07: 1 ug/min via INTRAVENOUS
  Administered 2023-05-11: 2 ug/min via INTRAVENOUS
  Filled 2023-05-07: qty 250

## 2023-05-07 MED ORDER — INSULIN ASPART 100 UNIT/ML IJ SOLN
0.0000 [IU] | Freq: Three times a day (TID) | INTRAMUSCULAR | Status: DC
  Administered 2023-05-07 (×2): 11 [IU] via SUBCUTANEOUS
  Administered 2023-05-08: 15 [IU] via SUBCUTANEOUS
  Administered 2023-05-08 – 2023-05-09 (×3): 11 [IU] via SUBCUTANEOUS
  Administered 2023-05-09 – 2023-05-10 (×4): 15 [IU] via SUBCUTANEOUS
  Administered 2023-05-10: 11 [IU] via SUBCUTANEOUS

## 2023-05-07 MED FILL — Lidocaine HCl Local Preservative Free (PF) Inj 1%: INTRAMUSCULAR | Qty: 30 | Status: AC

## 2023-05-07 NOTE — Plan of Care (Signed)

## 2023-05-07 NOTE — Consult Note (Addendum)
[facility]   MULTIDISCIPLINARY HEART VALVE TEAM  Cardiology Consultation:   Patient ID: Saphire Barnhart MRN: [id number]; DOB: May 14, 1966  Admit date: 05/04/2023 Date of Consult: 05/07/2023  Primary Care Provider: [name], [name] [facility] Cardiologist: Peter Swaziland, MD  [facility] Electrophysiologist:  None    Patient Profile:   Lianette Broussard is a 57 y.o. 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 Shirlee Latch.    History of Present Illness:   Patient has history of hypotension with many years with symptoms of dizziness, fatigue, lethargy, and inability to focus. Was felt to have POTS and was on Florinef, midodrine and Northera, although they were all eventually discontinued due to hypokalemia and cost issues. Has had ongoing issues with osteomyelitis requiring bilateral transmetatarsal amputations. LE arterial dopplers were normal in Dec 2022. She has had multiple readmissions over the past year. In 02/2022 admitted with right hip pain and was treated for myositis of right gluteus medius. She was admitted in 03/2022 with RUL PNA. Also noted to have small PE on right side. Treated with IV antibiotics and place on DOAC. Echo was OK. Admitted in 04/2022 with ARF requiring HD and acute anemia requiring transfusion. Had IVC filter placed. Later placed back on DOAC.  She most recently presented to [facility] on 05/04/23 with chest pain and found to have anterolateral STEMI. Brought emergently to the cath lab and found to have severe single-vessel disease with subtotal occlusion of the proximal LAD, 99% stenosis reduced to 0% with PTCA and stenting using a  3.0 x 32 mm Synergy DES. She had a patent left main, left circumflex, and dominant RCA with mild nonobstructive plaquing in those vessels. She was started on DAPT with aspirin and ticagrelor x 12 months. Noted to have elevated LVEDP and lactate during cath. 2D echo with LVEF 40% and anterior/anterolateral hypokinesis / dyskinesis. She developed cardiogenic shock in the CVICU and started on pressor support and IV lasix. She continued to have chest pain and persistent ST elevation. 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 Also noted to have a continuous murmur on exam. Repeat limited echo showed EF 55% with acute, large apical VSD. Dr. Cliffton Asters with [facility] was consulted who felt she would be high risk for surgery and require a Brilinta washout. Structural heart is consulted for consideration of transcatheter VSD closure. Patient's co-ox dropped to 44% and lactic acid 2.5-3.7 with worsening Scr. She was taken to the cath lab 7/14 for RHC which showed a mean RA 21, PA 50/25 mean 35, mean PCWP 28, CI 1.8 F/2.6 T, QpQs 3.3. IABP placed to help infarct area time to fibrose. She is currently on on milrinone 0.375 this morning, IABP 1:1, on heparin gtt.   Seen today while getting bedside TEE. Pt sedated and appears comfortable.     Past Medical History:  Diagnosis Date   Chronic cough    Chronic pain    Diabetes mellitus without complication (HCC)    Dyspnea    GERD (gastroesophageal reflux disease)    Hypokalemia    Hypothyroidism    Left wrist pain 06/01/2021   Myonecrosis (HCC) 07/01/2021   Neurocardiogenic syncope    OSA (obstructive sleep apnea)    does not  use CPAP   Osteomyelitis (HCC)    bilateral feet   Peripheral neuropathy    Presence of intrathecal pump    recieves Prialt/bupivicaine   Tear of gluteus medius tendon 07/01/2021   Wegener's granulomatosis 2009   Wegner's disease (congenital syphilitic osteochondritis)     Past Surgical History:  Procedure Laterality Date   AMPUTATION  Right 10/21/2020   Procedure: Right hallux amputation;  Surgeon: Toni Arthurs, MD;  Location: [facility];  Service: Orthopedics;  Laterality: Right;   AMPUTATION TOE Bilateral 08/12/2020   Procedure: Right 3rd toe amputation; left hallux and 3rd toe amputation;  Surgeon: Toni Arthurs, MD;  Location: [facility];  Service: Orthopedics;  Laterality: Bilateral;    AMPUTATION TOE Right 03/24/2021   Procedure: AMPUTATION TOE;  Surgeon: Toni Arthurs, MD;  Location: [facility];  Service: Orthopedics;  Laterality: Right;   APPENDECTOMY     BACK SURGERY     BIOPSY  03/30/2022   Procedure: BIOPSY;  Surgeon: Tomma Lightning, MD;  Location: [facility];  Service: Pulmonary;;   BRONCHIAL WASHINGS  03/30/2022   Procedure: BRONCHIAL WASHINGS;  Surgeon: Tomma Lightning, MD;  Location: [facility];  Service: Pulmonary;;   CHOLECYSTECTOMY     COLONOSCOPY N/A 06/02/2013   Procedure: COLONOSCOPY;  Surgeon: Graylin Shiver, MD;  Location: [facility] ENDOSCOPY;  Service: Endoscopy;  Laterality: N/A;   CORONARY/GRAFT ACUTE MI REVASCULARIZATION N/A 05/04/2023   Procedure: Coronary/Graft Acute MI Revascularization;  Surgeon: Tonny Bollman, MD;  Location: [facility] INVASIVE CV LAB;  Service: Cardiovascular;  Laterality: N/A;   HERNIA REPAIR  2000   IABP INSERTION N/A 05/06/2023   Procedure: IABP Insertion;  Surgeon: Dolores Patty, MD;  Location: [facility] INVASIVE CV LAB;  Service: Cardiovascular;  Laterality: N/A;   INCISION AND DRAINAGE ABSCESS Left 07/07/2016   Procedure: DEBRIDMENT LEFT THIGH ABSCESS, EXISION ACUTE SKIN RASH LEFT THIGH(1CM LESION);  Surgeon: Claud Kelp, MD;  Location: [facility];  Service: General;  Laterality: Left;   IR FLUORO GUIDE CV LINE RIGHT  04/24/2022   IR IVC FILTER PLMT / S&I /IMG GUID/MOD SED  05/17/2022   IR US GUIDE VASC ACCESS RIGHT  04/24/2022   LEFT HEART CATH AND CORONARY ANGIOGRAPHY N/A 05/04/2023   Procedure: LEFT HEART CATH AND CORONARY ANGIOGRAPHY;  Surgeon: Tonny Bollman, MD;  Location: [facility] INVASIVE CV LAB;  Service: Cardiovascular;  Laterality: N/A;   RIGHT HEART CATH N/A 05/06/2023   Procedure: RIGHT HEART CATH;  Surgeon: Dolores Patty, MD;  Location: [facility]VAS[facility] CV LAB;  Service: Cardiovascular;  Laterality: N/A;   SEPTOPLASTY  02/2013   spleen anuyism     splenic aneurysm     TRANSMETATARSAL AMPUTATION Bilateral 10/21/2020   Procedure: Left transmetatarsal amputation;  Surgeon: Toni Arthurs, MD;  Location: [facility];  Service: Orthopedics;  Laterality: Bilateral;   TRANSMETATARSAL AMPUTATION Left 03/24/2021   Procedure: TRANSMETATARSAL AMPUTATION;  Surgeon: Toni Arthurs, MD;  Location: [facility] OR;  Service: Orthopedics;  Laterality: Left;   VAGINAL HYSTERECTOMY     VIDEO BRONCHOSCOPY Right 03/30/2022   Procedure: VIDEO BRONCHOSCOPY WITH FLUORO;  Surgeon: Tomma Lightning, MD;  Location: [facility];  Service: Pulmonary;  Laterality: Right;  atypical pneumonia     Home Medications:  Prior to Admission medications   Medication Sig Start Date End Date Taking? Authorizing Provider  acetaminophen (TYLENOL) 325 MG tablet Take 2 tablets (650 mg total) by mouth every 6 (six) hours as needed for mild pain (or Fever >/=  101). 07/21/22  Yes [name], [name]  albuterol (VENTOLIN HFA) 108 (90 Base) MCG/ACT inhaler Inhale 2 puffs into the lungs every 6 (six) hours as needed for wheezing or shortness of breath. 04/12/22  Yes Omar Person, MD  amitriptyline (ELAVIL) 50 MG tablet TAKE 1 TABLET BY MOUTH AT BEDTIME FOR SLEEP. Patient taking differently: Take 50 mg by mouth at bedtime. TAKE 1 TABLET BY MOUTH AT BEDTIME FOR SLEEP. 04/12/23  Yes [name], [name]  aspirin EC 81 MG tablet Take 162 mg by mouth daily. Swallow whole.   Yes [provider]  Calcium Carb-Cholecalciferol (CALCIUM CARBONATE-VITAMIN D3 PO) Take 1 tablet by mouth daily.   Yes [provider]  cyanocobalamin (VITAMIN B12) 1000 MCG tablet Take 1,000 mcg by  mouth daily. 06/11/13  Yes [provider]  DULoxetine (CYMBALTA) 60 MG capsule Take 60 mg by mouth 2 (two) times daily. 02/26/23  Yes [provider]  levothyroxine (SYNTHROID) 25 MCG tablet Take 1 tablet (25 mcg total) by mouth daily. 02/22/23  Yes [name], [name]  LINZESS 145 MCG CAPS capsule Take one capsule (145 mcg dose) by mouth daily. 12/15/22  Yes [name], [name]  magnesium oxide (MAG-OX) 400 MG tablet Take 400 mg by mouth daily.   Yes [provider]  metFORMIN (GLUCOPHAGE) 500 MG tablet Take 1 tablet (500 mg total) by mouth 2 (two) times daily with a meal. 01/09/23  Yes [name], [name]  methocarbamol (ROBAXIN) 500 MG tablet Take 1 tablet (500 mg total) by mouth every 6 (six) hours as needed for muscle spasms. 12/04/22  Yes [name], [name]  naloxone [facility]) nasal spray 4 mg/0.1 mL Place 0.4 mg into the nose once. 12/11/19  Yes [provider]  NUCYNTA 50 MG tablet Take 50 mg by mouth 3 (three) times daily.   Yes [provider]  pantoprazole (PROTONIX) 40 MG tablet TAKE 1 TABLET BY MOUTH TWICE DAILY BEFORE A MEAL 04/12/23  Yes Mahon, Courtney L, NP  rosuvastatin (CRESTOR) 10 MG tablet TAKE 1 TABLET BY MOUTH AT BEDTIME 04/12/23  Yes [name], [name]  topiramate (TOPAMAX) 100 MG tablet TAKE (1) TABLET BY MOUTH ONCE DAILY. 04/12/23  Yes [name], [name]  Vitamin D, Ergocalciferol, (DRISDOL) 1.25 MG (50000 UNIT) CAPS capsule Take 1 capsule (50,000 Units total) by mouth every Friday. 11/03/22  Yes [name], [name]  gabapentin (NEURONTIN) 300 MG capsule Take 1 capsule (300 mg total) by mouth at bedtime. Patient taking differently: Take 600 mg by mouth 3 (three) times daily. Patient is taking 2 in the am, 2 in the afternoon, and 2 at bedtime. 02/05/23   [name], [name]  lidocaine (LIDODERM) 5 % Place 1 patch onto the skin daily as needed (for pain). Remove & Discard patch within 12 hours or as directed by MD    [provider]    Inpatient Medications: Scheduled Meds:  amitriptyline  50 mg Oral QHS   aspirin EC  81 mg Oral Daily   Chlorhexidine Gluconate Cloth  6 each Topical Daily   digoxin  0.125 mg Oral Daily   DULoxetine  60 mg Oral BID   furosemide  80 mg Intravenous BID   gabapentin  600 mg Oral TID   insulin aspart  0-20 Units Subcutaneous TID WC   insulin aspart  0-5 Units Subcutaneous QHS   insulin glargine-yfgn  15 Units Subcutaneous QHS   levothyroxine  25 mcg Oral Daily   linaclotide  145 mcg Oral QAC  breakfast   magnesium oxide  400 mg Oral Daily   pantoprazole  40 mg Oral BID AC   rosuvastatin  40 mg Oral Daily   senna  2 tablet Oral BID   sodium chloride flush  10-40 mL Intracatheter Q12H   sodium chloride flush  3 mL Intravenous Q12H   sodium chloride flush  3 mL Intravenous Q12H   tapentadol  50 mg Oral TID   ticagrelor  90 mg Oral BID   topiramate  100 mg Oral Daily   Continuous Infusions:  sodium chloride Stopped (05/07/23 1128)   sodium chloride Stopped (05/04/23 1630)   sodium chloride     sodium chloride     amiodarone 30 mg/hr (05/07/23 1200)   heparin 800 Units/hr (05/07/23 1200)   milrinone 0.375 mcg/kg/min (05/07/23 1200)   norepinephrine (LEVOPHED) Adult infusion Stopped (05/07/23 0935)   potassium chloride 50 mL/hr at 05/07/23 1200   vasopressin     PRN Meds: sodium chloride, sodium chloride, acetaminophen, albuterol, HYDROcodone-acetaminophen, lidocaine, methocarbamol, morphine injection, ondansetron (ZOFRAN) IV, sodium chloride flush, sodium chloride flush, sodium chloride flush  Allergies:    Allergies  Allergen Reactions   Ibuprofen Other (See Comments)    Pt is unable to take this due to kidney problems.      Penicillins Rash and Other (See Comments)    Has patient had a PCN reaction causing immediate rash, facial/tongue/throat swelling, SOB or lightheadedness with hypotension: No Has patient had a PCN reaction causing severe rash involving  mucus membranes or skin necrosis: No Has patient had a PCN reaction that required hospitalization No Has patient had a PCN reaction occurring within the last 10 years: No If all of the above answers are "NO", then may proceed with Cephalosporin use.    Social History:   Social History   Socioeconomic History   Marital status: Married    Spouse name: Not on file   Number of children: Not on file   Years of education: Not on file   Highest education level: Not on file  Occupational History   Not on file  Tobacco Use   Smoking status: Never   Smokeless tobacco: Never  Vaping Use   Vaping status: Never Used  Substance and Sexual Activity   Alcohol use: Yes    Comment: occasionally   Drug use: No   Sexual activity: Yes    Birth control/protection: Surgical  Other Topics Concern   Not on file  Social History Narrative   Not on file   Social Determinants of Health   Financial Resource Strain: Medium Risk (03/23/2023)   Received from [facility]   Overall Financial Resource Strain (CARDIA)    Difficulty of Paying Living Expenses: Somewhat hard  Food Insecurity: No Food Insecurity (05/04/2023)   Hunger Vital Sign    Worried About Running Out of Food in the Last Year: Never true    Ran Out of Food in the Last Year: Never true  Transportation Needs: No Transportation Needs (05/04/2023)   PRAPARE - Administrator, Civil Service (Medical): No    Lack of Transportation (Non-Medical): No  Physical Activity: Inactive (10/29/2021)   Received from [facility]   Exercise Vital Sign    Days of Exercise per Week: 0 days    Minutes of Exercise per Session: 0 min  Stress: Stress Concern Present (10/29/2021)   Received from [facility] Occupational Health - Occupational Stress Questionnaire  Feeling of Stress : To some extent  Social Connections: Unknown (02/19/2022)   Received from [facility]   Social Network    Social  Network: Not on file  Intimate Partner Violence: Not At Risk (05/04/2023)   Humiliation, Afraid, Rape, and Kick questionnaire    Fear of Current or Ex-Partner: No    Emotionally Abused: No    Physically Abused: No    Sexually Abused: No    Family History:   Family History  Problem Relation Age of Onset   Diabetes Mother    Heart disease Mother    Diabetes Father      ROS:  Please see the history of present illness.  All other ROS reviewed and negative.     Physical Exam/Data:   Vitals:   05/07/23 1115 05/07/23 1130 05/07/23 1145 05/07/23 1200  BP:    108/71  Pulse: (!) 201 (!) 206 (!) 211 (!) 201  Resp: (!) 21 (!) 21 (!) 21 20  Temp: 99.7 F (37.6 C) 99.5 F (37.5 C) 99.3 F (37.4 C) 99.3 F (37.4 C)  TempSrc:      SpO2: 95% 93% 93% 95%  Weight:      Height:        Intake/Output Summary (Last 24 hours) at 05/07/2023 1224 Last data filed at 05/07/2023 1200 Gross per 24 hour  Intake 1271.38 ml  Output 3190 ml  Net -1918.62 ml      05/07/2023    5:00 AM 05/06/2023    5:00 AM 05/05/2023    5:00 AM  Last 3 Weights  Weight (lbs) 231 lb 7.7 oz 231 lb 14.8 oz 228 lb 13.4 oz  Weight (kg) 105 kg 105.2 kg 103.8 kg     Body mass index is 39.73 kg/m.  General: obese, chronically ill appearing HEENT: normal Lymph: no adenopathy Neck: no JVD Cardiac:  normal S1, S2; RRR; IAPB sounds. When turned off you can hear the continuous murmur at apex Lungs:  clear to auscultation bilaterally, no wheezing, rhonchi or rales  Abd: soft, nontender, no hepatomegaly  Ext: no edema,  S/p bilateral TMA amputations.  Skin: warm and dry  Neuro:  sedated Psych:  sedated  EKG:  The EKG was personally reviewed and demonstrates: sinus tachy, HR 117 with RBBB, LAD, inferior and septal Q waves.  Telemetry:  Telemetry was personally reviewed and demonstrates:  sinus tach  Cardiac Studies & Procedures   CARDIAC CATHETERIZATION  CARDIAC CATHETERIZATION 05/06/2023  Narrative Findings:  RA  = 21 RV = 49/25 PA = 50/25 (35) PCW = 28 Fick cardiac output/index = 3.8/1.8 Thermo 5.4/2.6 FA sat = 91% PA sat = 75% RA sat = 38%   Qp/QS = 3.31  Assessment: 1. Cardiogenic shock with elevated filling pressures and large L-> R shunt  Plan/Discussion:  Continue IABP support and milrinone. TEE tomorrow to further evaluate VSD.  Arvilla Meres, MD 10:28 PM   CARDIAC CATHETERIZATION 05/04/2023  Narrative   Prox RCA lesion is 40% stenosed.   Prox LAD to Mid LAD lesion is 99% stenosed.   A drug-eluting stent was successfully placed using a STENT SYNERGY XD 3.0X32.   Post intervention, there is a 0% residual stenosis.   LV end diastolic pressure is moderately elevated.   Recommend uninterrupted dual antiplatelet therapy with Aspirin 81mg  daily and Ticagrelor 90mg  twice daily for a minimum of 12 months (ACS-Class I recommendation).  1.  Severe single-vessel disease with subtotal occlusion of the proximal LAD, 99% stenosis reduced to  0% with PTCA and stenting using a 3.0 x 32 mm Synergy DES.  TIMI I flow at baseline, TIMI-3 flow post procedure. 2.  Patent left main, left circumflex, and dominant RCA with mild nonobstructive plaquing in those vessels 3.  Moderately elevated LVEDP of 22 mmHg 4.  Poorly visualized LV function by ventriculography, but basal to mid segments appear hyperdynamic, possible hypokinesis of the distal anterior wall and apex.  Unable to estimate LVEF.  2D echo to be done for further assessment  Recommendations: CV-ICU care.  DAPT with aspirin and ticagrelor x 12 months.  Aggressive post MI medical therapy. Needs close evaluation, consider diuresis. LVEDP 24 mmHg, lactate 2.9, SBP > 90 on no support, but narrow pulse pressure.  Findings Coronary Findings Diagnostic  Dominance: Right  Left Main The vessel exhibits minimal luminal irregularities. The left main is patent with no significant stenosis.  It divides into the LAD and left circumflex.  Left  Anterior Descending Prox LAD to Mid LAD lesion is 99% stenosed. The lesion is thrombotic.  Left Circumflex There is mild diffuse disease throughout the vessel. Widely patent vessel with mild nonobstructive plaquing and no significant stenosis.  There are 2 OM branches with no stenosis.  Right Coronary Artery There is mild diffuse disease throughout the vessel. The RCA is patent.  The vessel is large in caliber, dominant.  The PDA and PLA branches are patent with no stenosis.  There is a 30 to 40% proximal lesion noted.  There is no obstructive disease throughout the RCA distribution. Prox RCA lesion is 40% stenosed.  Intervention  Prox LAD to Mid LAD lesion Stent CATH VISTA GUIDE 6FR XBLAD3.0 guide catheter was inserted. Lesion crossed with guidewire using a WIRE RUNTHROUGH .[id number]. Pre-stent angioplasty was performed using a BALLN EMERGE MR 2.5X15. A drug-eluting stent was successfully placed using a STENT SYNERGY XD 3.0X32. Post-stent angioplasty was performed using a BALLN [place] EMERGE MR 3.25X20. Post-Intervention Lesion Assessment The intervention was successful. Pre-interventional TIMI flow is 1. Post-intervention TIMI flow is 3. No complications occurred at this lesion. There is a 0% residual stenosis post intervention.     ECHOCARDIOGRAM  ECHOCARDIOGRAM LIMITED 05/06/2023  Narrative ECHOCARDIOGRAM LIMITED REPORT    Patient Name:   Hava Snelson Date of Exam: 05/06/2023 Medical Rec #:  [id number]   Height:       64.0 in Accession #:    [id number]  Weight:       231.9 lb Date of Birth:  Aug 06, 1966   BSA:          2.083 m Patient Age:    57 years    BP:           87/64 mmHg Patient Gender: F           HR:           119 bpm. Exam Location:  Inpatient  Procedure: Limited Echo, Color Doppler and Cardiac Doppler  STAT ECHO  Indications:    Murmur. Acute myocardial infarction.  History:        Patient has prior history of Echocardiogram examinations, most recent 05/04/2023. Risk  Factors:Sleep Apnea and Diabetes.  Sonographer:    Delcie Roch RDCS Referring Phys: [id number] DANIEL R BENSIMHON  IMPRESSIONS   1. LV function is overall vigorous but there is apical akinesis with evidence of an acute apical VSD with left to right flow. Left ventricular ejection fraction, by estimation, is 60 to 65%. The left ventricle has normal function. The left ventricle demonstrates  regional wall motion abnormalities (see scoring diagram/findings for description). Left ventricular diastolic parameters are indeterminate. 2. Right ventricular systolic function is normal. There is normal pulmonary artery systolic pressure. 3. Left atrial size was mildly dilated. 4. Mild mitral valve regurgitation. 5. The aortic valve is tricuspid. Aortic valve regurgitation is not visualized. Aortic valve sclerosis/calcification is present, without any evidence of aortic stenosis. 6. The inferior vena cava is dilated in size with <50% respiratory variability, suggesting right atrial pressure of 15 mmHg.  Conclusion(s)/Recommendation(s): LV function is overall vigorous but there is apical akinesis with evidence of an acute apical VSD with left to right flow.  FINDINGS Left Ventricle: LV function is overall vigorous but there is apical akinesis with evidence of an acute apical VSD with left to right flow. Left ventricular ejection fraction, by estimation, is 60 to 65%. The left ventricle has normal function. The left ventricle demonstrates regional wall motion abnormalities. Left ventricular diastolic parameters are indeterminate.   LV Wall Scoring: The apical lateral segment, apical septal segment, apical anterior segment, and apical inferior segment are akinetic.  Right Ventricle: Right ventricular systolic function is normal. There is normal pulmonary artery systolic pressure. The tricuspid regurgitant velocity is 2.65 m/s, and with an assumed right atrial pressure of 5 mmHg, the estimated right  ventricular systolic pressure is 33.1 mmHg.  Left Atrium: Left atrial size was mildly dilated.  Pericardium: Trivial pericardial effusion is present. The pericardial effusion is anterior to the right ventricle.  Mitral Valve: Mild mitral valve regurgitation.  Tricuspid Valve: Tricuspid valve regurgitation is trivial.  Aortic Valve: The aortic valve is tricuspid. Aortic valve regurgitation is not visualized. Aortic valve sclerosis/calcification is present, without any evidence of aortic stenosis.  Venous: The inferior vena cava is dilated in size with less than 50% respiratory variability, suggesting right atrial pressure of 15 mmHg.  Additional Comments: Spectral Doppler performed. Color Doppler performed.  LEFT VENTRICLE PLAX 2D LVIDd:         3.60 cm LVIDs:         2.70 cm LV PW:         1.20 cm LV IVS:        1.00 cm   IVC IVC diam: 2.00 cm  LEFT ATRIUM         Index LA diam:    3.10 cm 1.49 cm/m AORTIC VALVE             PULMONIC VALVE LVOT Vmax:   85.30 cm/s  RVOT Peak grad: 5 mmHg LVOT Vmean:  57.100 cm/s LVOT VTI:    0.106 m  TRICUSPID VALVE TR Peak grad:   28.1 mmHg TR Vmax:        265.00 cm/s  SHUNTS Systemic VTI: 0.11 m Pulmonic VTI: 0.206 m  Arvilla Meres MD Electronically signed by Arvilla Meres MD Signature Date/Time: 05/06/2023/9:40:03 AM    Final             Laboratory Data:  High Sensitivity Troponin:   Recent Labs  Lab 05/04/23 0620 05/04/23 0837 05/05/23 1001  TROPONINIHS 2,536* >24,000* >24,000*     Chemistry Recent Labs  Lab 05/06/23 0255 05/06/23 1920 05/07/23 0416  NA 129* 127* 131*  K 3.3* 4.3 3.7  CL 99 94* 97*  CO2 20* 18* 19*  GLUCOSE 277* 422* 311*  BUN 22* 30* 27*  CREATININE 1.45* 1.69* 1.64*  CALCIUM 8.0* 7.8* 7.7*  GFRNONAA 42* 35* 36*  ANIONGAP 10 15 15     Recent Labs  Lab 05/04/23  0612  PROT 6.3*  ALBUMIN 3.5  AST 31  ALT 21  ALKPHOS 137*  BILITOT 0.4   Hematology Recent Labs  Lab  05/05/23 0251 05/06/23 0255 05/07/23 0416  WBC 17.6* 14.3* 11.6*  RBC 4.07 3.59* 3.32*  HGB 11.3* 10.1* 9.6*  HCT 36.4 32.3* 29.9*  MCV 89.4 90.0 90.1  MCH 27.8 28.1 28.9  MCHC 31.0 31.3 32.1  RDW 14.1 14.2 14.4  PLT 267 239 226   BNP Recent Labs  Lab 05/04/23 0620  BNP 123.2*    DDimer  Recent Labs  Lab 05/04/23 0612  DDIMER 0.46     Radiology/Studies:  DG CHEST PORT 1 VIEW  Result Date: 05/07/2023 CLINICAL DATA:  Congestive heart failure. EXAM: PORTABLE CHEST 1 VIEW COMPARISON:  May 04, 2023. FINDINGS: Stable cardiomegaly with mild central pulmonary vascular congestion. Right internal jugular Swan-Ganz catheter is noted with tip directed into right pulmonary artery. Mild pulmonary edema may be present. Bony thorax is unremarkable. Left-sided PICC line is noted with tip in expected position of the SVC. IMPRESSION: Stable cardiomegaly with mild central pulmonary vascular congestion. Mild pulmonary edema may be present. Right internal jugular Swan-Ganz catheter is noted with tip directed into right pulmonary artery. Electronically Signed   By: Lupita Raider M.D.   On: 05/07/2023 11:02   CARDIAC CATHETERIZATION  Result Date: 05/06/2023 Findings: RA = 21 RV = 49/25 PA = 50/25 (35) PCW = 28 Fick cardiac output/index = 3.8/1.8 Thermo 5.4/2.6 FA sat = 91% PA sat = 75% RA sat = 38% Qp/QS = 3.31 Assessment: 1. Cardiogenic shock with elevated filling pressures and large L-> R shunt Plan/Discussion: Continue IABP support and milrinone. TEE tomorrow to further evaluate VSD. Arvilla Meres, MD 10:28 PM   ECHOCARDIOGRAM LIMITED  Result Date: 05/06/2023    ECHOCARDIOGRAM LIMITED REPORT   Patient Name:   Kytzia Raz Date of Exam: 05/06/2023 Medical Rec #:  [id number]   Height:       64.0 in Accession #:    [id number]  Weight:       231.9 lb Date of Birth:  [birth year]   BSA:          2.083 m Patient Age:    57 years    BP:           87/64 mmHg Patient Gender: F           HR:           119  bpm. Exam Location:  Inpatient Procedure: Limited Echo, Color Doppler and Cardiac Doppler STAT ECHO Indications:    Murmur. Acute myocardial infarction.  History:        Patient has prior history of Echocardiogram examinations, most                 recent 05/04/2023. Risk Factors:Sleep Apnea and Diabetes.  Sonographer:    Delcie Roch RDCS Referring Phys: [id number] DANIEL R BENSIMHON IMPRESSIONS  1. LV function is overall vigorous but there is apical akinesis with evidence of an acute apical VSD with left to right flow. Left ventricular ejection fraction, by estimation, is 60 to 65%. The left ventricle has normal function. The left ventricle demonstrates regional wall motion abnormalities (see scoring diagram/findings for description). Left ventricular diastolic parameters are indeterminate.  2. Right ventricular systolic function is normal. There is normal pulmonary artery systolic pressure.  3. Left atrial size was mildly dilated.  4. Mild mitral valve regurgitation.  5. The aortic valve is tricuspid. Aortic valve  regurgitation is not visualized. Aortic valve sclerosis/calcification is present, without any evidence of aortic stenosis.  6. The inferior vena cava is dilated in size with <50% respiratory variability, suggesting right atrial pressure of 15 mmHg. Conclusion(s)/Recommendation(s): LV function is overall vigorous but there is apical akinesis with evidence of an acute apical VSD with left to right flow. FINDINGS  Left Ventricle: LV function is overall vigorous but there is apical akinesis with evidence of an acute apical VSD with left to right flow. Left ventricular ejection fraction, by estimation, is 60 to 65%. The left ventricle has normal function. The left ventricle demonstrates regional wall motion abnormalities. Left ventricular diastolic parameters are indeterminate.  LV Wall Scoring: The apical lateral segment, apical septal segment, apical anterior segment, and apical inferior segment are  akinetic. Right Ventricle: Right ventricular systolic function is normal. There is normal pulmonary artery systolic pressure. The tricuspid regurgitant velocity is 2.65 m/s, and with an assumed right atrial pressure of 5 mmHg, the estimated right ventricular systolic pressure is 33.1 mmHg. Left Atrium: Left atrial size was mildly dilated. Pericardium: Trivial pericardial effusion is present. The pericardial effusion is anterior to the right ventricle. Mitral Valve: Mild mitral valve regurgitation. Tricuspid Valve: Tricuspid valve regurgitation is trivial. Aortic Valve: The aortic valve is tricuspid. Aortic valve regurgitation is not visualized. Aortic valve sclerosis/calcification is present, without any evidence of aortic stenosis. Venous: The inferior vena cava is dilated in size with less than 50% respiratory variability, suggesting right atrial pressure of 15 mmHg. Additional Comments: Spectral Doppler performed. Color Doppler performed.  LEFT VENTRICLE PLAX 2D LVIDd:         3.60 cm LVIDs:         2.70 cm LV PW:         1.20 cm LV IVS:        1.00 cm  IVC IVC diam: 2.00 cm LEFT ATRIUM         Index LA diam:    3.10 cm 1.49 cm/m  AORTIC VALVE             PULMONIC VALVE LVOT Vmax:   85.30 cm/s  RVOT Peak grad: 5 mmHg LVOT Vmean:  57.100 cm/s LVOT VTI:    0.106 m TRICUSPID VALVE TR Peak grad:   28.1 mmHg TR Vmax:        265.00 cm/s  SHUNTS Systemic VTI: 0.11 m Pulmonic VTI: 0.206 m Arvilla Meres MD Electronically signed by Arvilla Meres MD Signature Date/Time: 05/06/2023/9:40:03 AM    Final    DG Chest Port 1 View  Result Date: 05/04/2023 CLINICAL DATA:  PICC line placement EXAM: PORTABLE CHEST 1 VIEW COMPARISON:  Previous studies including the examination done earlier today FINDINGS: Transverse diameter of heart is increased. Central pulmonary vessels are slightly less prominent. Increased interstitial markings in both lungs suggesting interstitial pulmonary edema. There is no focal pulmonary  consolidation. Lateral CP angles are clear. There is no pneumothorax. There is interval placement of PICC line through the left upper extremity with its tip in superior vena cava. IMPRESSION: Tip of PICC line is seen in superior vena cava. There is no pneumothorax. There is decreasing pulmonary vascular congestion. Pulmonary edema. Electronically Signed   By: Ernie Avena M.D.   On: 05/04/2023 17:20   [place] EKG SITE RITE  Result Date: 05/04/2023 If Site Rite image not attached, placement could not be confirmed due to current cardiac rhythm.  ECHOCARDIOGRAM COMPLETE  Result Date: 05/04/2023    ECHOCARDIOGRAM REPORT  Patient Name:   Jamayia Mayabb Date of Exam: 05/04/2023 Medical Rec #:  [id number]   Height:       64.0 in Accession #:    [id number]  Weight:       220.0 lb Date of Birth:  11-09-1965   BSA:          2.037 m Patient Age:    57 years    BP:           84/72 mmHg Patient Gender: F           HR:           111 bpm. Exam Location:  Inpatient Procedure: 2D Echo, Cardiac Doppler, Color Doppler and Intracardiac            Opacification Agent Indications:    CHF- Acute Systolic  History:        Patient has prior history of Echocardiogram examinations, most                 recent 03/28/2022. Risk Factors:Hypertension, Dyslipidemia, Sleep                 Apnea, Hypothyroidism and Diabetes.  Sonographer:    Raeford Razor Sonographer#2:  Darlys Gales Referring Phys: [phone] MICHAEL COOPER  Sonographer Comments: Technically difficult study due to poor echo windows and patient is obese. Image acquisition challenging due to patient body habitus. IMPRESSIONS  1. Septal and apical akinesis Hyperdynamic basal function findings consistent wtih LAD infarct No mural apical thrombus with definity. Left ventricular ejection fraction, by estimation, is 45 to 50%. The left ventricle has mildly decreased function. The  left ventricle demonstrates regional wall motion abnormalities (see scoring diagram/findings for description). The  left ventricular internal cavity size was mildly dilated. Left ventricular diastolic parameters were normal.  2. Right ventricular systolic function is normal. The right ventricular size is normal.  3. The mitral valve is normal in structure. No evidence of mitral valve regurgitation. No evidence of mitral stenosis.  4. The aortic valve is tricuspid. There is mild calcification of the aortic valve. There is mild thickening of the aortic valve. Aortic valve regurgitation is not visualized. Aortic valve sclerosis is present, with no evidence of aortic valve stenosis.  5. The inferior vena cava is normal in size with greater than 50% respiratory variability, suggesting right atrial pressure of 3 mmHg. FINDINGS  Left Ventricle: Septal and apical akinesis Hyperdynamic basal function findings consistent wtih LAD infarct No mural apical thrombus with definity. Left ventricular ejection fraction, by estimation, is 45 to 50%. The left ventricle has mildly decreased function. The left ventricle demonstrates regional wall motion abnormalities. Definity contrast agent was given IV to delineate the left ventricular endocardial borders. The left ventricular internal cavity size was mildly dilated. There is no left ventricular hypertrophy. Left ventricular diastolic parameters were normal. Right Ventricle: The right ventricular size is normal. No increase in right ventricular wall thickness. Right ventricular systolic function is normal. Left Atrium: Left atrial size was normal in size. Right Atrium: Right atrial size was normal in size. Pericardium: There is no evidence of pericardial effusion. Mitral Valve: The mitral valve is normal in structure. No evidence of mitral valve regurgitation. No evidence of mitral valve stenosis. Tricuspid Valve: The tricuspid valve is normal in structure. Tricuspid valve regurgitation is not demonstrated. No evidence of tricuspid stenosis. Aortic Valve: The aortic valve is tricuspid. There is mild  calcification of the aortic valve. There is mild thickening of the aortic valve.  Aortic valve regurgitation is not visualized. Aortic valve sclerosis is present, with no evidence of aortic valve stenosis. Aortic valve peak gradient measures 7.5 mmHg. Pulmonic Valve: The pulmonic valve was normal in structure. Pulmonic valve regurgitation is not visualized. No evidence of pulmonic stenosis. Aorta: The aortic root is normal in size and structure. Venous: The inferior vena cava is normal in size with greater than 50% respiratory variability, suggesting right atrial pressure of 3 mmHg. IAS/Shunts: No atrial level shunt detected by color flow Doppler.  LEFT VENTRICLE PLAX 2D LVIDd:         5.10 cm      Diastology LVIDs:         3.70 cm      LV e' medial:    8.70 cm/s LV PW:         0.80 cm      LV E/e' medial:  11.6 LV IVS:        0.80 cm      LV e' lateral:   15.20 cm/s LVOT diam:     1.80 cm      LV E/e' lateral: 6.6 LV SV:         22 LV SV Index:   11 LVOT Area:     2.54 cm  LV Volumes (MOD) LV vol d, MOD A2C: 122.0 ml LV vol d, MOD A4C: 116.0 ml LV vol s, MOD A2C: 56.5 ml LV vol s, MOD A4C: 54.7 ml LV SV MOD A2C:     65.5 ml LV SV MOD A4C:     116.0 ml LV SV MOD BP:      66.2 ml RIGHT VENTRICLE RV Basal diam:  2.60 cm RV S prime:     12.00 cm/s TAPSE (M-mode): 1.4 cm LEFT ATRIUM             Index        RIGHT ATRIUM          Index LA diam:        3.60 cm 1.77 cm/m   RA Area:     7.55 cm LA Vol (A2C):   25.1 ml 12.32 ml/m  RA Volume:   12.10 ml 5.94 ml/m LA Vol (A4C):   17.8 ml 8.74 ml/m LA Biplane Vol: 21.1 ml 10.36 ml/m  AORTIC VALVE AV Area (Vmax): 1.31 cm AV Vmax:        137.00 cm/s AV Peak Grad:   7.5 mmHg LVOT Vmax:      70.70 cm/s LVOT Vmean:     44.200 cm/s LVOT VTI:       0.088 m  AORTA Ao Root diam: 3.10 cm Ao Asc diam:  3.50 cm MITRAL VALVE MV Area (PHT): 4.21 cm     SHUNTS MV Decel Time: 180 msec     Systemic VTI:  0.09 m MV E velocity: 101.00 cm/s  Systemic Diam: 1.80 cm MV A velocity: 120.00  cm/s MV E/A ratio:  0.84 Charlton Haws MD Electronically signed by Charlton Haws MD Signature Date/Time: 05/04/2023/12:35:25 PM    Final    CARDIAC CATHETERIZATION  Result Date: 05/04/2023   Prox RCA lesion is 40% stenosed.   Prox LAD to Mid LAD lesion is 99% stenosed.   A drug-eluting stent was successfully placed using a STENT SYNERGY XD 3.0X32.   Post intervention, there is a 0% residual stenosis.   LV end diastolic pressure is moderately elevated.   Recommend uninterrupted dual antiplatelet therapy with Aspirin 81mg  daily and Ticagrelor 90mg   twice daily for a minimum of 12 months (ACS-Class I recommendation). 1.  Severe single-vessel disease with subtotal occlusion of the proximal LAD, 99% stenosis reduced to 0% with PTCA and stenting using a 3.0 x 32 mm Synergy DES.  TIMI I flow at baseline, TIMI-3 flow post procedure. 2.  Patent left main, left circumflex, and dominant RCA with mild nonobstructive plaquing in those vessels 3.  Moderately elevated LVEDP of 22 mmHg 4.  Poorly visualized LV function by ventriculography, but basal to mid segments appear hyperdynamic, possible hypokinesis of the distal anterior wall and apex.  Unable to estimate LVEF.  2D echo to be done for further assessment Recommendations: CV-ICU care.  DAPT with aspirin and ticagrelor x 12 months.  Aggressive post MI medical therapy. Needs close evaluation, consider diuresis. LVEDP 24 mmHg, lactate 2.9, SBP > 90 on no support, but narrow pulse pressure.   DG Chest Portable 1 View  Result Date: 05/04/2023 CLINICAL DATA:  57 year old female with chest pain, possible myocardial infarction. EXAM: PORTABLE CHEST 1 VIEW COMPARISON:  Portable chest 03/20/2023. FINDINGS: Portable AP semi upright view at 0607 hours. Similar low lung volumes and mediastinal contours, but diffusely indistinct pulmonary vasculature now. No pneumothorax, pleural effusion or consolidation. Visualized tracheal air column is within normal limits. No acute osseous  abnormality identified. Paucity of bowel gas in the visible abdomen. IMPRESSION: Acute Pulmonary Edema.  Low lung volumes stable compared to May. Electronically Signed   By: Odessa Fleming M.D.   On: 05/04/2023 06:26     Assessment and Plan:   Volanda Mangine is a 57 y.o. 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 Shirlee Latch.  TEE completed today and pending formal read. It shows normal LV function with apical akinesis and an 0.8 cm VSD. There may not be adequate rims for VSD closure. We are uploading the TEE to industry. If she is not a surgical candidate, this may be the only option to try.   Dr. Lynnette Caffey to follow.   For questions or updates, please contact [facility] Please consult [email or web address] for contact info under    Signed, Cline Crock, PA-C  05/07/2023 12:24 PM  ATTENDING ATTESTATION:  After conducting a review of all available clinical information with the care team, interviewing the patient, and performing a physical exam, I agree with the findings and plan described in this note.   GEN: No acute distress.   HEENT:  MMM, RIJ swan in place, no scleral icterus Cardiac: RRR, +systolic murmur; IABP sounds,  Respiratory: Mild rales at bases. GI: Soft, nontender, non-distended  MS: No edema; No deformity. Neuro:  Nonfocal  Vasc:  RCFA IABP  The patient is a 57 year old female with notably morbid obesity, Wegener's disease, bilateral transmetatarsal amputations, previous DVT and PE and unable to tolerate anticoagulation with IVC filter placed, and type 2 diabetes who  presented with acute onset chest pain on Friday, July 12.  Her EKG demonstrated anterior ST elevation myocardial infarction with widespread Q waves even in the inferior leads.  On query the patient is unable to tell me about antecedent chest pain, shortness of breath, or nausea prior to Friday.  She underwent PCI of  the LAD.  An echocardiogram done on July 12 demonstrated ejection fraction of 45 to 50% with septal and apical akinesis without a ventricular septal defect.  Following that she developed cardiogenic shock.  She was started on milrinone and vasopressin.  Her EKG demonstrated persistent ST elevations.  She was noted to have continued chest pain and colchicine was started.  Her creatinine was found to increase to 1.45 with Choloxin of 52%.  On Sunday, July 14 she was noted to have developed a continuous murmur at limited echocardiogram demonstrated a large VSD close to the apex.  Cardiothoracic surgical evaluation was obtained.  I was consulted in regards to the feasibility of transcatheter closure for this postinfarction VSD.  Given the time course I suspect she had her acute myocardial infarction perhaps as early as July 5 or thereabouts.  That means she is as much is 7 to 10 days out from her event.  Certainly the longer we can wait the better it is for her in regards to both the surgical and transcatheter closure.  Unfortunately given her overall course and the very large shunt I am not sure that she can remain stable for a long period of time.  At this point in time I will be meeting with Drs. McLain and Lightfoot to discuss further tomorrow morning.  I have asked industry to go ahead and start the process of procurement of the postinfarction VSD device which is under humanitarian device examination and will thus need IRB approval.  Because it is apically located it is quite possible that an oversized device could splay against the apex of the heart and remain in place.  Unfortunately given the friable  nature of the tissue it is hard to predict exactly how successful this endeavor may be.  Certainly if she is a surgical candidate this is our best option.  Additionally because of logistical challenges in regards to procuring the device and to do the procedure here if she were to decompensate abruptly that may pose a challenge to [place].  Her clinical trajectory on a day-to-day basis will be quite important in order to decide on the best treatment strategy.  I discussed the clinical scenario and possible treatment options with the patient, her husband on the phone, and other family members at the bedside.  Alverda Skeans, MD Pager [phone]

## 2023-05-07 NOTE — Progress Notes (Addendum)
ANTICOAGULATION CONSULT NOTE  Pharmacy Consult for heparin  Indication: IABP  Allergies  Allergen Reactions   Ibuprofen Other (See Comments)    Pt is unable to take this due to kidney problems.      Penicillins Rash and Other (See Comments)    Has patient had a PCN reaction causing immediate rash, facial/tongue/throat swelling, SOB or lightheadedness with hypotension: No Has patient had a PCN reaction causing severe rash involving mucus membranes or skin necrosis: No Has patient had a PCN reaction that required hospitalization No Has patient had a PCN reaction occurring within the last 10 years: No If all of the above answers are "NO", then may proceed with Cephalosporin use.    Patient Measurements: Height: 5\' 4"  (162.6 cm) Weight: 105 kg (231 lb 7.7 oz) IBW/kg (Calculated) : 54.7 HEPARIN DW (KG): 77.8    Vital Signs: Temp: 99.9 F (37.7 C) (07/15 0845) Temp Source: Core (07/15 0400) BP: 117/71 (07/15 0800) Pulse Rate: 205 (07/15 0845)  Labs: Recent Labs    05/05/23 0251 05/05/23 1001 05/06/23 0255 05/06/23 1920 05/07/23 0416 05/07/23 0730  HGB 11.3*  --  10.1*  --  9.6*  --   HCT 36.4  --  32.3*  --  29.9*  --   PLT 267  --  239  --  226  --   HEPARINUNFRC  --   --   --   --   --  0.25*  CREATININE 1.35*  --  1.45* 1.69* 1.64*  --   TROPONINIHS  --  >24,000*  --   --   --   --     Estimated Creatinine Clearance: 44.7 mL/min (A) (by C-G formula based on SCr of 1.64 mg/dL (H)).   Medical History: Past Medical History:  Diagnosis Date   Chronic cough    Chronic pain    Diabetes mellitus without complication (HCC)    Dyspnea    GERD (gastroesophageal reflux disease)    Hypokalemia    Hypothyroidism    Left wrist pain 06/01/2021   Myonecrosis (HCC) 07/01/2021   Neurocardiogenic syncope    OSA (obstructive sleep apnea)    does not use CPAP   Osteomyelitis (HCC)    bilateral feet   Peripheral neuropathy    Presence of intrathecal pump    recieves  Prialt/bupivicaine   Tear of gluteus medius tendon 07/01/2021   Wegener's granulomatosis 2009   Wegner's disease (congenital syphilitic osteochondritis)     Medications:  Medications Prior to Admission  Medication Sig Dispense Refill Last Dose   acetaminophen (TYLENOL) 325 MG tablet Take 2 tablets (650 mg total) by mouth every 6 (six) hours as needed for mild pain (or Fever >/= 101).   Past Week   albuterol (VENTOLIN HFA) 108 (90 Base) MCG/ACT inhaler Inhale 2 puffs into the lungs every 6 (six) hours as needed for wheezing or shortness of breath. 1 each 11 unknown   amitriptyline (ELAVIL) 50 MG tablet TAKE 1 TABLET BY MOUTH AT BEDTIME FOR SLEEP. (Patient taking differently: Take 50 mg by mouth at bedtime. TAKE 1 TABLET BY MOUTH AT BEDTIME FOR SLEEP.) 30 tablet 0 05/03/2023   aspirin EC 81 MG tablet Take 162 mg by mouth daily. Swallow whole.   05/04/2023   Calcium Carb-Cholecalciferol (CALCIUM CARBONATE-VITAMIN D3 PO) Take 1 tablet by mouth daily.   05/03/2023   cyanocobalamin (VITAMIN B12) 1000 MCG tablet Take 1,000 mcg by mouth daily.   05/03/2023   DULoxetine (CYMBALTA) 60 MG capsule Take  60 mg by mouth 2 (two) times daily.   05/03/2023   levothyroxine (SYNTHROID) 25 MCG tablet Take 1 tablet (25 mcg total) by mouth daily. 30 tablet 0 05/03/2023   LINZESS 145 MCG CAPS capsule Take one capsule (145 mcg dose) by mouth daily. 30 capsule 0 Past Month   magnesium oxide (MAG-OX) 400 MG tablet Take 400 mg by mouth daily.   Past Month   metFORMIN (GLUCOPHAGE) 500 MG tablet Take 1 tablet (500 mg total) by mouth 2 (two) times daily with a meal. 180 tablet 3 05/03/2023   methocarbamol (ROBAXIN) 500 MG tablet Take 1 tablet (500 mg total) by mouth every 6 (six) hours as needed for muscle spasms. 30 tablet 0 Past Week   naloxone (NARCAN) nasal spray 4 mg/0.1 mL Place 0.4 mg into the nose once.   unknown   NUCYNTA 50 MG tablet Take 50 mg by mouth 3 (three) times daily.   05/03/2023   pantoprazole (PROTONIX) 40 MG  tablet TAKE 1 TABLET BY MOUTH TWICE DAILY BEFORE A MEAL 30 tablet 0 05/03/2023   rosuvastatin (CRESTOR) 10 MG tablet TAKE 1 TABLET BY MOUTH AT BEDTIME 30 tablet 0 05/03/2023   topiramate (TOPAMAX) 100 MG tablet TAKE (1) TABLET BY MOUTH ONCE DAILY. 30 tablet 0 05/03/2023   Vitamin D, Ergocalciferol, (DRISDOL) 1.25 MG (50000 UNIT) CAPS capsule Take 1 capsule (50,000 Units total) by mouth every Friday. 7 capsule 3 Past Week   gabapentin (NEURONTIN) 300 MG capsule Take 1 capsule (300 mg total) by mouth at bedtime. (Patient taking differently: Take 600 mg by mouth 3 (three) times daily. Patient is taking 2 in the am, 2 in the afternoon, and 2 at bedtime.) 90 capsule 3    lidocaine (LIDODERM) 5 % Place 1 patch onto the skin daily as needed (for pain). Remove & Discard patch within 12 hours or as directed by MD      Scheduled:   amitriptyline  50 mg Oral QHS   aspirin EC  81 mg Oral Daily   Chlorhexidine Gluconate Cloth  6 each Topical Daily   digoxin  0.125 mg Oral Daily   DULoxetine  60 mg Oral BID   furosemide  80 mg Intravenous BID   gabapentin  600 mg Oral TID   insulin aspart  0-15 Units Subcutaneous TID WC   insulin aspart  0-5 Units Subcutaneous QHS   insulin glargine-yfgn  10 Units Subcutaneous QHS   levothyroxine  25 mcg Oral Daily   linaclotide  145 mcg Oral QAC breakfast   magnesium oxide  400 mg Oral Daily   pantoprazole  40 mg Oral BID AC   rosuvastatin  40 mg Oral Daily   senna  2 tablet Oral BID   sodium chloride flush  10-40 mL Intracatheter Q12H   sodium chloride flush  3 mL Intravenous Q12H   sodium chloride flush  3 mL Intravenous Q12H   tapentadol  50 mg Oral TID   ticagrelor  90 mg Oral BID   topiramate  100 mg Oral Daily    Assessment: 57 yo female with VSD and dropping co-ox. She is noted s/p cath with IABP.  CBC stable. Heparin level of 0.25 within goal of 0.2-0.5. No new drug-drug interactions to assess at this time.  Goal of Therapy:  Heparin level 0.2-0.5  units/ml Monitor platelets by anticoagulation protocol: Yes   Plan:  -continue UFH gtt at 800 units/hour -heparin level in 6 hrs at 1330; if within goal, then consider switching  to daily monitoring -of note on 7/12 TG of 401, will order confirmatory aPTT with heparin level -daily monitoring level (aPTT or heparin level) and CBC  Wilmer Floor, PharmD PGY2 Cardiology Pharmacy Resident

## 2023-05-07 NOTE — Progress Notes (Signed)
ANTICOAGULATION CONSULT NOTE  Pharmacy Consult for heparin  Indication: IABP  Allergies  Allergen Reactions   Ibuprofen Other (See Comments)    Pt is unable to take this due to kidney problems.      Penicillins Rash and Other (See Comments)    Has patient had a PCN reaction causing immediate rash, facial/tongue/throat swelling, SOB or lightheadedness with hypotension: No Has patient had a PCN reaction causing severe rash involving mucus membranes or skin necrosis: No Has patient had a PCN reaction that required hospitalization No Has patient had a PCN reaction occurring within the last 10 years: No If all of the above answers are "NO", then may proceed with Cephalosporin use.    Patient Measurements: Height: 5\' 4"  (162.6 cm) Weight: 105 kg (231 lb 7.7 oz) IBW/kg (Calculated) : 54.7 HEPARIN DW (KG): 77.8    Vital Signs: Temp: 98.4 F (36.9 C) (07/15 1445) Temp Source: Core (07/15 0400) BP: 105/71 (07/15 1400) Pulse Rate: 199 (07/15 1445)  Labs: Recent Labs    05/05/23 0251 05/05/23 1001 05/06/23 0255 05/06/23 1920 05/07/23 0416 05/07/23 0730 05/07/23 1413  HGB 11.3*  --  10.1*  --  9.6*  --   --   HCT 36.4  --  32.3*  --  29.9*  --   --   PLT 267  --  239  --  226  --   --   APTT  --   --   --   --   --   --  44*  HEPARINUNFRC  --   --   --   --   --  0.25* 0.21*  CREATININE 1.35*  --  1.45* 1.69* 1.64*  --   --   TROPONINIHS  --  >24,000*  --   --   --   --   --     Estimated Creatinine Clearance: 44.7 mL/min (A) (by C-G formula based on SCr of 1.64 mg/dL (H)).   Medical History: Past Medical History:  Diagnosis Date   Chronic cough    Chronic pain    Diabetes mellitus without complication (HCC)    Dyspnea    GERD (gastroesophageal reflux disease)    Hypokalemia    Hypothyroidism    Left wrist pain 06/01/2021   Myonecrosis (HCC) 07/01/2021   Neurocardiogenic syncope    OSA (obstructive sleep apnea)    does not use CPAP   Osteomyelitis (HCC)     bilateral feet   Peripheral neuropathy    Presence of intrathecal pump    recieves Prialt/bupivicaine   Tear of gluteus medius tendon 07/01/2021   Wegener's granulomatosis 2009   Wegner's disease (congenital syphilitic osteochondritis)     Medications:  Medications Prior to Admission  Medication Sig Dispense Refill Last Dose   acetaminophen (TYLENOL) 325 MG tablet Take 2 tablets (650 mg total) by mouth every 6 (six) hours as needed for mild pain (or Fever >/= 101).   Past Week   albuterol (VENTOLIN HFA) 108 (90 Base) MCG/ACT inhaler Inhale 2 puffs into the lungs every 6 (six) hours as needed for wheezing or shortness of breath. 1 each 11 unknown   amitriptyline (ELAVIL) 50 MG tablet TAKE 1 TABLET BY MOUTH AT BEDTIME FOR SLEEP. (Patient taking differently: Take 50 mg by mouth at bedtime. TAKE 1 TABLET BY MOUTH AT BEDTIME FOR SLEEP.) 30 tablet 0 05/03/2023   aspirin EC 81 MG tablet Take 162 mg by mouth daily. Swallow whole.   05/04/2023   Calcium  Carb-Cholecalciferol (CALCIUM CARBONATE-VITAMIN D3 PO) Take 1 tablet by mouth daily.   05/03/2023   cyanocobalamin (VITAMIN B12) 1000 MCG tablet Take 1,000 mcg by mouth daily.   05/03/2023   DULoxetine (CYMBALTA) 60 MG capsule Take 60 mg by mouth 2 (two) times daily.   05/03/2023   levothyroxine (SYNTHROID) 25 MCG tablet Take 1 tablet (25 mcg total) by mouth daily. 30 tablet 0 05/03/2023   LINZESS 145 MCG CAPS capsule Take one capsule (145 mcg dose) by mouth daily. 30 capsule 0 Past Month   magnesium oxide (MAG-OX) 400 MG tablet Take 400 mg by mouth daily.   Past Month   metFORMIN (GLUCOPHAGE) 500 MG tablet Take 1 tablet (500 mg total) by mouth 2 (two) times daily with a meal. 180 tablet 3 05/03/2023   methocarbamol (ROBAXIN) 500 MG tablet Take 1 tablet (500 mg total) by mouth every 6 (six) hours as needed for muscle spasms. 30 tablet 0 Past Week   naloxone (NARCAN) nasal spray 4 mg/0.1 mL Place 0.4 mg into the nose once.   unknown   NUCYNTA 50 MG tablet Take  50 mg by mouth 3 (three) times daily.   05/03/2023   pantoprazole (PROTONIX) 40 MG tablet TAKE 1 TABLET BY MOUTH TWICE DAILY BEFORE A MEAL 30 tablet 0 05/03/2023   rosuvastatin (CRESTOR) 10 MG tablet TAKE 1 TABLET BY MOUTH AT BEDTIME 30 tablet 0 05/03/2023   topiramate (TOPAMAX) 100 MG tablet TAKE (1) TABLET BY MOUTH ONCE DAILY. 30 tablet 0 05/03/2023   Vitamin D, Ergocalciferol, (DRISDOL) 1.25 MG (50000 UNIT) CAPS capsule Take 1 capsule (50,000 Units total) by mouth every Friday. 7 capsule 3 Past Week   gabapentin (NEURONTIN) 300 MG capsule Take 1 capsule (300 mg total) by mouth at bedtime. (Patient taking differently: Take 600 mg by mouth 3 (three) times daily. Patient is taking 2 in the am, 2 in the afternoon, and 2 at bedtime.) 90 capsule 3    lidocaine (LIDODERM) 5 % Place 1 patch onto the skin daily as needed (for pain). Remove & Discard patch within 12 hours or as directed by MD      Scheduled:   amitriptyline  50 mg Oral QHS   aspirin EC  81 mg Oral Daily   Chlorhexidine Gluconate Cloth  6 each Topical Daily   digoxin  0.125 mg Oral Daily   DULoxetine  60 mg Oral BID   furosemide  80 mg Intravenous BID   gabapentin  600 mg Oral TID   insulin aspart  0-20 Units Subcutaneous TID WC   insulin aspart  0-5 Units Subcutaneous QHS   insulin glargine-yfgn  15 Units Subcutaneous QHS   levothyroxine  25 mcg Oral Daily   linaclotide  145 mcg Oral QAC breakfast   magnesium oxide  400 mg Oral Daily   pantoprazole  40 mg Oral BID AC   rosuvastatin  40 mg Oral Daily   senna  2 tablet Oral BID   sodium chloride flush  10-40 mL Intracatheter Q12H   sodium chloride flush  3 mL Intravenous Q12H   sodium chloride flush  3 mL Intravenous Q12H   tapentadol  50 mg Oral TID   ticagrelor  90 mg Oral BID   topiramate  100 mg Oral Daily    Assessment: 57 yo female with VSD and dropping co-ox. She is noted s/p cath with IABP.   CBC stable. Heparin level of 0.21 within goal of 0.2-0.5. No new drug-drug  interactions to assess at  this time.  Goal of Therapy:  Heparin level 0.2-0.5 units/ml Monitor platelets by anticoagulation protocol: Yes   Plan:  -continue heparin gtt at 800 units/hour -daily heparin level and CBC.  Reece Leader, Colon Flattery, BCCP Clinical Pharmacist  05/07/2023 3:10 PM   [facility] pharmacy phone numbers are listed on [email or web address]

## 2023-05-07 NOTE — H&P (View-Only) (Signed)
Patient ID: [name], female   DOB: 23-Oct-1966, 57 y.o.   MRN: [id number]    Advanced Heart Failure Rounding Note   Subjective:    7/14: RHC mean RA 21, PA 50/25 mean 35, mean PCWP 28, CI 1.8 F/2.6 T, QpQs 3.3.  IABP placed.   No chest pain this morning.  Breathing better since IABP placed.   She is on milrinone 0.375 this morning, IABP 1:1 on heparin gtt.    In NSR on amiodarone.   Scr 1.06 -> 1.35 -> 1.45 -> 1.69 -> 1.64 Lactate 2.5 -> 1.6  Swan: CVP 16 PA 30/14 CI 2.3 Co-ox 52%   Objective:   Weight Range:  Vital Signs:   Temp:  [98.8 F (37.1 C)-99.5 F (37.5 C)] 99.5 F (37.5 C) (07/15 0600) Pulse Rate:  [0-262] 95 (07/15 0600) Resp:  [13-35] 22 (07/15 0600) BP: (84-130)/(40-100) 129/68 (07/15 0500) SpO2:  [73 %-95 %] 92 % (07/15 0600) Weight:  [105 kg] 105 kg (07/15 0500) Last BM Date : 05/06/23  Weight change: Filed Weights   05/05/23 0500 05/06/23 0500 05/07/23 0500  Weight: 103.8 kg 105.2 kg 105 kg    Intake/Output:   Intake/Output Summary (Last 24 hours) at 05/07/2023 0657 Last data filed at 05/07/2023 0600 Gross per 24 hour  Intake 1682.45 ml  Output 1700 ml  Net -17.55 ml     Physical Exam: General: NAD Neck: Thick, internal jugular difficult, no thyromegaly or thyroid nodule.  Lungs: Decreased at bases.  CV: Nondisplaced PMI.  Heart regular S1/S2, no S3/S4, IABP sounds.  No peripheral edema.  Abdomen: Soft, nontender, no hepatosplenomegaly, no distention.  Skin: Intact without lesions or rashes.  Neurologic: Alert and oriented x 3.  Psych: Normal affect. Extremities: No clubbing or cyanosis. S/p bilateral TMA amputations.  HEENT: Normal.    Telemetry: SR 90s Personally reviewed  Labs: Basic Metabolic Panel: Recent Labs  Lab 05/04/23 0612 05/05/23 0251 05/06/23 0255 05/06/23 1920 05/07/23 0416  NA 137 133* 129* 127* 131*  K 4.2 3.9 3.3* 4.3 3.7  CL 103 103 99 94* 97*  CO2 19* 19* 20* 18* 19*  GLUCOSE 269* 252* 277* 422*  311*  BUN 13 19 22* 30* 27*  CREATININE 1.06* 1.35* 1.45* 1.69* 1.64*  CALCIUM 9.3 8.7* 8.0* 7.8* 7.7*  MG  --  1.8 2.3  --   --     Liver Function Tests: Recent Labs  Lab 05/04/23 0612  AST 31  ALT 21  ALKPHOS 137*  BILITOT 0.4  PROT 6.3*  ALBUMIN 3.5   No results for input(s): "LIPASE", "AMYLASE" in the last 168 hours. No results for input(s): "AMMONIA" in the last 168 hours.  CBC: Recent Labs  Lab 05/04/23 0612 05/05/23 0251 05/06/23 0255 05/07/23 0416  WBC 16.1* 17.6* 14.3* 11.6*  NEUTROABS 8.4*  --   --   --   HGB 13.1 11.3* 10.1* 9.6*  HCT 41.2 36.4 32.3* 29.9*  MCV 90.7 89.4 90.0 90.1  PLT 292 267 239 226    Cardiac Enzymes: No results for input(s): "CKTOTAL", "CKMB", "CKMBINDEX", "TROPONINI" in the last 168 hours.  BNP: BNP (last 3 results) Recent Labs    05/04/23 0620  BNP 123.2*    ProBNP (last 3 results) No results for input(s): "PROBNP" in the last 8760 hours.    Other results:  Imaging: CARDIAC CATHETERIZATION  Result Date: 05/06/2023 Findings: RA = 21 RV = 49/25 PA = 50/25 (35) PCW = 28 Fick cardiac output/index = 3.8/1.8  Thermo 5.4/2.6 FA sat = 91% PA sat = 75% RA sat = 38% Qp/QS = 3.31 Assessment: 1. Cardiogenic shock with elevated filling pressures and large L-> R shunt Plan/Discussion: Continue IABP support and milrinone. TEE tomorrow to further evaluate VSD. Arvilla Meres, MD 10:28 PM   ECHOCARDIOGRAM LIMITED  Result Date: 05/06/2023    ECHOCARDIOGRAM LIMITED REPORT   Patient Name:   [name] Date of Exam: 05/06/2023 Medical Rec #:  [id number]   Height:       64.0 in Accession #:    [id number]  Weight:       231.9 lb Date of Birth:  [birth year]   BSA:          2.083 m Patient Age:    57 years    BP:           87/64 mmHg Patient Gender: F           HR:           119 bpm. Exam Location:  Inpatient Procedure: Limited Echo, Color Doppler and Cardiac Doppler STAT ECHO Indications:    Murmur. Acute myocardial infarction.  History:         Patient has prior history of Echocardiogram examinations, most                 recent 05/04/2023. Risk Factors:Sleep Apnea and Diabetes.  Sonographer:    Delcie Roch RDCS Referring Phys: [id number] DANIEL R BENSIMHON IMPRESSIONS  1. LV function is overall vigorous but there is apical akinesis with evidence of an acute apical VSD with left to right flow. Left ventricular ejection fraction, by estimation, is 60 to 65%. The left ventricle has normal function. The left ventricle demonstrates regional wall motion abnormalities (see scoring diagram/findings for description). Left ventricular diastolic parameters are indeterminate.  2. Right ventricular systolic function is normal. There is normal pulmonary artery systolic pressure.  3. Left atrial size was mildly dilated.  4. Mild mitral valve regurgitation.  5. The aortic valve is tricuspid. Aortic valve regurgitation is not visualized. Aortic valve sclerosis/calcification is present, without any evidence of aortic stenosis.  6. The inferior vena cava is dilated in size with <50% respiratory variability, suggesting right atrial pressure of 15 mmHg. Conclusion(s)/Recommendation(s): LV function is overall vigorous but there is apical akinesis with evidence of an acute apical VSD with left to right flow. FINDINGS  Left Ventricle: LV function is overall vigorous but there is apical akinesis with evidence of an acute apical VSD with left to right flow. Left ventricular ejection fraction, by estimation, is 60 to 65%. The left ventricle has normal function. The left ventricle demonstrates regional wall motion abnormalities. Left ventricular diastolic parameters are indeterminate.  LV Wall Scoring: The apical lateral segment, apical septal segment, apical anterior segment, and apical inferior segment are akinetic. Right Ventricle: Right ventricular systolic function is normal. There is normal pulmonary artery systolic pressure. The tricuspid regurgitant velocity is 2.65 m/s, and  with an assumed right atrial pressure of 5 mmHg, the estimated right ventricular systolic pressure is 33.1 mmHg. Left Atrium: Left atrial size was mildly dilated. Pericardium: Trivial pericardial effusion is present. The pericardial effusion is anterior to the right ventricle. Mitral Valve: Mild mitral valve regurgitation. Tricuspid Valve: Tricuspid valve regurgitation is trivial. Aortic Valve: The aortic valve is tricuspid. Aortic valve regurgitation is not visualized. Aortic valve sclerosis/calcification is present, without any evidence of aortic stenosis. Venous: The inferior vena cava is dilated in size with less than 50% respiratory variability,  suggesting right atrial pressure of 15 mmHg. Additional Comments: Spectral Doppler performed. Color Doppler performed.  LEFT VENTRICLE PLAX 2D LVIDd:         3.60 cm LVIDs:         2.70 cm LV PW:         1.20 cm LV IVS:        1.00 cm  IVC IVC diam: 2.00 cm LEFT ATRIUM         Index LA diam:    3.10 cm 1.49 cm/m  AORTIC VALVE             PULMONIC VALVE LVOT Vmax:   85.30 cm/s  RVOT Peak grad: 5 mmHg LVOT Vmean:  57.100 cm/s LVOT VTI:    0.106 m TRICUSPID VALVE TR Peak grad:   28.1 mmHg TR Vmax:        265.00 cm/s  SHUNTS Systemic VTI: 0.11 m Pulmonic VTI: 0.206 m Arvilla Meres MD Electronically signed by Arvilla Meres MD Signature Date/Time: 05/06/2023/9:40:03 AM    Final      Medications:     Scheduled Medications:  amitriptyline  50 mg Oral QHS   aspirin EC  81 mg Oral Daily   Chlorhexidine Gluconate Cloth  6 each Topical Daily   digoxin  0.125 mg Oral Daily   DULoxetine  60 mg Oral BID   furosemide  80 mg Intravenous BID   gabapentin  600 mg Oral TID   insulin aspart  0-15 Units Subcutaneous TID WC   insulin aspart  0-5 Units Subcutaneous QHS   insulin glargine-yfgn  10 Units Subcutaneous QHS   levothyroxine  25 mcg Oral Daily   linaclotide  145 mcg Oral QAC breakfast   magnesium oxide  400 mg Oral Daily   pantoprazole  40 mg Oral BID AC    rosuvastatin  40 mg Oral Daily   senna  2 tablet Oral BID   sodium chloride flush  10-40 mL Intracatheter Q12H   sodium chloride flush  3 mL Intravenous Q12H   sodium chloride flush  3 mL Intravenous Q12H   tapentadol  50 mg Oral TID   ticagrelor  90 mg Oral BID   topiramate  100 mg Oral Daily    Infusions:  sodium chloride 10 mL/hr at 05/07/23 0600   sodium chloride Stopped (05/04/23 1630)   sodium chloride     sodium chloride     amiodarone 30 mg/hr (05/07/23 0600)   heparin 800 Units/hr (05/07/23 0600)   milrinone 0.375 mcg/kg/min (05/07/23 0600)   vasopressin      PRN Medications: sodium chloride, sodium chloride, acetaminophen, albuterol, HYDROcodone-acetaminophen, lidocaine, methocarbamol, morphine injection, ondansetron (ZOFRAN) IV, sodium chloride flush, sodium chloride flush, sodium chloride flush    Patient Profile    [name] is a 57 y.o. 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 Admitted with STEMI s/p PDI to LAD now in cardiogenic shock.    Assessment/Plan:   Cardiogenic shock -> acute HF with mildly-reduced EF complicated by apical VSD.  - Echo 6/23 EF 55%, no RWMA, normal RV, trivial MR - Echo 7/12,  EF 45% - Echo 7/14: EF 60% with apical VSD, normal RV. Apical VSD creates large shunt by RHC QpQs 3.3.  - IABP 1:1 on heparin gtt with milrinone 0.375.  CI 2.3 this morning, lactate normal.  - CVP 16 today, will give Lasix 80 mg IV bid with  1 dose of metolazone.  - Continue digoxin (will check level in am with AKI) - Will need TEE to more closely assess the apical VSD, will do TEE later this morning and will need CCM to help with sedation.  VSD will need definitive treatment, either percutaneously via plug or open surgery.  Open surgery will be complicated by the need to stop ticagrelor (has new LAD stent) and ideally will need some time for tissues around  VSD to fibrose. Will discuss with Drs Lynnette Caffey and Cliffton Asters.    Anterior STEMI, CAD - HsTrop >24K on admission - s/p PCI to LAD 7/12, post DES 0% residual stenosis. Medical management of 40% stenosed RCA.  - plan for DAPT with ASA and Ticagrelor x12 months - Continue statin, LDL unable to be calculated with high TGD, continue Crestor 40.  - Complicated by infarct-related VSD.   3. Post-MI chest pain -possible pericarditis - No longer reporting pericardial-type pain.    DM II - A1c last 7.6 3/24, update - SSI while admitted per primary - has diabetic foot ulcer, WOC following - ABI 12/22 were normal   Obesity - Body mass index is 37.76 kg/m.  - consider GLP-1 as OP   OSA - does not use CPAP   HLD - Continue statin    CRITICAL CARE Performed by: Marca Ancona  Total critical care time: 45 minutes  Critical care time was exclusive of separately billable procedures and treating other patients.  Critical care was necessary to treat or prevent imminent or life-threatening deterioration.  Critical care was time spent personally by me (independent of midlevel providers or residents) on the following activities: development of treatment plan with patient and/or surrogate as well as nursing, discussions with consultants, evaluation of patient's response to treatment, examination of patient, obtaining history from patient or surrogate, ordering and performing treatments and interventions, ordering and review of laboratory studies, ordering and review of radiographic studies, pulse oximetry and re-evaluation of patient's condition.    Length of Stay: 3   Marca Ancona MD 05/07/2023, 6:57 AM  Advanced Heart Failure Team Pager [phone] (M-F; 7a - 4p)  Please contact [facility] for night-coverage after hours (4p -7a ) and weekends on [email or web address]

## 2023-05-07 NOTE — Procedures (Signed)
Conscious sedation note [phone])  With close monitoring of airway and vitals, 90mg  propofol administered in 10-20mg  aliquots to allow TEE Monitored patient until return of alertness No complications  Myrla Halsted MD PCCM

## 2023-05-07 NOTE — Progress Notes (Signed)
Orthopedic Tech Progress Note Patient Details:  [name] April 24, 1966 [id number]  Ortho Devices Type of Ortho Device: Knee Immobilizer Ortho Device/Splint Location: rle Ortho Device/Splint Interventions: Ordered, Application, Adjustment  The rn assisted with application. Post Interventions Patient Tolerated: Well Instructions Provided: Care of device, Adjustment of device  Trinna Post 05/07/2023, 6:03 AM

## 2023-05-07 NOTE — Progress Notes (Addendum)
Patient ID: [name], female   DOB: 14-Nov-1965, 57 y.o.   MRN: [id number]    Advanced Heart Failure Rounding Note   Subjective:    7/14: RHC mean RA 21, PA 50/25 mean 35, mean PCWP 28, CI 1.8 F/2.6 T, QpQs 3.3.  IABP placed.   No chest pain this morning.  Breathing better since IABP placed.   She is on milrinone 0.375 this morning, IABP 1:1 on heparin gtt.    In NSR on amiodarone.   Scr 1.06 -> 1.35 -> 1.45 -> 1.69 -> 1.64 Lactate 2.5 -> 1.6  Swan: CVP 16 PA 30/14 CI 2.3 Co-ox 52%   Objective:   Weight Range:  Vital Signs:   Temp:  [98.8 F (37.1 C)-99.5 F (37.5 C)] 99.5 F (37.5 C) (07/15 0600) Pulse Rate:  [0-262] 95 (07/15 0600) Resp:  [13-35] 22 (07/15 0600) BP: (84-130)/(40-100) 129/68 (07/15 0500) SpO2:  [73 %-95 %] 92 % (07/15 0600) Weight:  [105 kg] 105 kg (07/15 0500) Last BM Date : 05/06/23  Weight change: Filed Weights   05/05/23 0500 05/06/23 0500 05/07/23 0500  Weight: 103.8 kg 105.2 kg 105 kg    Intake/Output:   Intake/Output Summary (Last 24 hours) at 05/07/2023 0657 Last data filed at 05/07/2023 0600 Gross per 24 hour  Intake 1682.45 ml  Output 1700 ml  Net -17.55 ml     Physical Exam: General: NAD Neck: Thick, internal jugular difficult, no thyromegaly or thyroid nodule.  Lungs: Decreased at bases.  CV: Nondisplaced PMI.  Heart regular S1/S2, no S3/S4, IABP sounds.  No peripheral edema.  Abdomen: Soft, nontender, no hepatosplenomegaly, no distention.  Skin: Intact without lesions or rashes.  Neurologic: Alert and oriented x 3.  Psych: Normal affect. Extremities: No clubbing or cyanosis. S/p bilateral TMA amputations.  HEENT: Normal.    Telemetry: SR 90s Personally reviewed  Labs: Basic Metabolic Panel: Recent Labs  Lab 05/04/23 0612 05/05/23 0251 05/06/23 0255 05/06/23 1920 05/07/23 0416  NA 137 133* 129* 127* 131*  K 4.2 3.9 3.3* 4.3 3.7  CL 103 103 99 94* 97*  CO2 19* 19* 20* 18* 19*  GLUCOSE 269* 252* 277* 422*  311*  BUN 13 19 22* 30* 27*  CREATININE 1.06* 1.35* 1.45* 1.69* 1.64*  CALCIUM 9.3 8.7* 8.0* 7.8* 7.7*  MG  --  1.8 2.3  --   --     Liver Function Tests: Recent Labs  Lab 05/04/23 0612  AST 31  ALT 21  ALKPHOS 137*  BILITOT 0.4  PROT 6.3*  ALBUMIN 3.5   No results for input(s): "LIPASE", "AMYLASE" in the last 168 hours. No results for input(s): "AMMONIA" in the last 168 hours.  CBC: Recent Labs  Lab 05/04/23 0612 05/05/23 0251 05/06/23 0255 05/07/23 0416  WBC 16.1* 17.6* 14.3* 11.6*  NEUTROABS 8.4*  --   --   --   HGB 13.1 11.3* 10.1* 9.6*  HCT 41.2 36.4 32.3* 29.9*  MCV 90.7 89.4 90.0 90.1  PLT 292 267 239 226    Cardiac Enzymes: No results for input(s): "CKTOTAL", "CKMB", "CKMBINDEX", "TROPONINI" in the last 168 hours.  BNP: BNP (last 3 results) Recent Labs    05/04/23 0620  BNP 123.2*    ProBNP (last 3 results) No results for input(s): "PROBNP" in the last 8760 hours.    Other results:  Imaging: CARDIAC CATHETERIZATION  Result Date: 05/06/2023 Findings: RA = 21 RV = 49/25 PA = 50/25 (35) PCW = 28 Fick cardiac output/index = 3.8/1.8  Thermo 5.4/2.6 FA sat = 91% PA sat = 75% RA sat = 38% Qp/QS = 3.31 Assessment: 1. Cardiogenic shock with elevated filling pressures and large L-> R shunt Plan/Discussion: Continue IABP support and milrinone. TEE tomorrow to further evaluate VSD. Arvilla Meres, MD 10:28 PM   ECHOCARDIOGRAM LIMITED  Result Date: 05/06/2023    ECHOCARDIOGRAM LIMITED REPORT   Patient Name:   [name] Date of Exam: 05/06/2023 Medical Rec #:  [id number]   Height:       64.0 in Accession #:    [id number]  Weight:       231.9 lb Date of Birth:  29-Dec-1965   BSA:          2.083 m Patient Age:    57 years    BP:           87/64 mmHg Patient Gender: F           HR:           119 bpm. Exam Location:  Inpatient Procedure: Limited Echo, Color Doppler and Cardiac Doppler STAT ECHO Indications:    Murmur. Acute myocardial infarction.  History:         Patient has prior history of Echocardiogram examinations, most                 recent 05/04/2023. Risk Factors:Sleep Apnea and Diabetes.  Sonographer:    Delcie Roch RDCS Referring Phys: [id number] DANIEL R BENSIMHON IMPRESSIONS  1. LV function is overall vigorous but there is apical akinesis with evidence of an acute apical VSD with left to right flow. Left ventricular ejection fraction, by estimation, is 60 to 65%. The left ventricle has normal function. The left ventricle demonstrates regional wall motion abnormalities (see scoring diagram/findings for description). Left ventricular diastolic parameters are indeterminate.  2. Right ventricular systolic function is normal. There is normal pulmonary artery systolic pressure.  3. Left atrial size was mildly dilated.  4. Mild mitral valve regurgitation.  5. The aortic valve is tricuspid. Aortic valve regurgitation is not visualized. Aortic valve sclerosis/calcification is present, without any evidence of aortic stenosis.  6. The inferior vena cava is dilated in size with <50% respiratory variability, suggesting right atrial pressure of 15 mmHg. Conclusion(s)/Recommendation(s): LV function is overall vigorous but there is apical akinesis with evidence of an acute apical VSD with left to right flow. FINDINGS  Left Ventricle: LV function is overall vigorous but there is apical akinesis with evidence of an acute apical VSD with left to right flow. Left ventricular ejection fraction, by estimation, is 60 to 65%. The left ventricle has normal function. The left ventricle demonstrates regional wall motion abnormalities. Left ventricular diastolic parameters are indeterminate.  LV Wall Scoring: The apical lateral segment, apical septal segment, apical anterior segment, and apical inferior segment are akinetic. Right Ventricle: Right ventricular systolic function is normal. There is normal pulmonary artery systolic pressure. The tricuspid regurgitant velocity is 2.65 m/s, and  with an assumed right atrial pressure of 5 mmHg, the estimated right ventricular systolic pressure is 33.1 mmHg. Left Atrium: Left atrial size was mildly dilated. Pericardium: Trivial pericardial effusion is present. The pericardial effusion is anterior to the right ventricle. Mitral Valve: Mild mitral valve regurgitation. Tricuspid Valve: Tricuspid valve regurgitation is trivial. Aortic Valve: The aortic valve is tricuspid. Aortic valve regurgitation is not visualized. Aortic valve sclerosis/calcification is present, without any evidence of aortic stenosis. Venous: The inferior vena cava is dilated in size with less than 50% respiratory variability,  suggesting right atrial pressure of 15 mmHg. Additional Comments: Spectral Doppler performed. Color Doppler performed.  LEFT VENTRICLE PLAX 2D LVIDd:         3.60 cm LVIDs:         2.70 cm LV PW:         1.20 cm LV IVS:        1.00 cm  IVC IVC diam: 2.00 cm LEFT ATRIUM         Index LA diam:    3.10 cm 1.49 cm/m  AORTIC VALVE             PULMONIC VALVE LVOT Vmax:   85.30 cm/s  RVOT Peak grad: 5 mmHg LVOT Vmean:  57.100 cm/s LVOT VTI:    0.106 m TRICUSPID VALVE TR Peak grad:   28.1 mmHg TR Vmax:        265.00 cm/s  SHUNTS Systemic VTI: 0.11 m Pulmonic VTI: 0.206 m Arvilla Meres MD Electronically signed by Arvilla Meres MD Signature Date/Time: 05/06/2023/9:40:03 AM    Final      Medications:     Scheduled Medications:  amitriptyline  50 mg Oral QHS   aspirin EC  81 mg Oral Daily   Chlorhexidine Gluconate Cloth  6 each Topical Daily   digoxin  0.125 mg Oral Daily   DULoxetine  60 mg Oral BID   furosemide  80 mg Intravenous BID   gabapentin  600 mg Oral TID   insulin aspart  0-15 Units Subcutaneous TID WC   insulin aspart  0-5 Units Subcutaneous QHS   insulin glargine-yfgn  10 Units Subcutaneous QHS   levothyroxine  25 mcg Oral Daily   linaclotide  145 mcg Oral QAC breakfast   magnesium oxide  400 mg Oral Daily   pantoprazole  40 mg Oral BID AC    rosuvastatin  40 mg Oral Daily   senna  2 tablet Oral BID   sodium chloride flush  10-40 mL Intracatheter Q12H   sodium chloride flush  3 mL Intravenous Q12H   sodium chloride flush  3 mL Intravenous Q12H   tapentadol  50 mg Oral TID   ticagrelor  90 mg Oral BID   topiramate  100 mg Oral Daily    Infusions:  sodium chloride 10 mL/hr at 05/07/23 0600   sodium chloride Stopped (05/04/23 1630)   sodium chloride     sodium chloride     amiodarone 30 mg/hr (05/07/23 0600)   heparin 800 Units/hr (05/07/23 0600)   milrinone 0.375 mcg/kg/min (05/07/23 0600)   vasopressin      PRN Medications: sodium chloride, sodium chloride, acetaminophen, albuterol, HYDROcodone-acetaminophen, lidocaine, methocarbamol, morphine injection, ondansetron (ZOFRAN) IV, sodium chloride flush, sodium chloride flush, sodium chloride flush    Patient Profile    [name] is a 57 y.o. 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 Admitted with STEMI s/p PDI to LAD now in cardiogenic shock.    Assessment/Plan:   Cardiogenic shock -> acute HF with mildly-reduced EF complicated by apical VSD.  - Echo 6/23 EF 55%, no RWMA, normal RV, trivial MR - Echo 7/12,  EF 45% - Echo 7/14: EF 60% with apical VSD, normal RV. Apical VSD creates large shunt by RHC QpQs 3.3.  - IABP 1:1 on heparin gtt with milrinone 0.375.  CI 2.3 this morning, lactate normal.  - CVP 16 today, will give Lasix 80 mg IV bid with  1 dose of metolazone.  - Continue digoxin (will check level in am with AKI) - Will need TEE to more closely assess the apical VSD, will do TEE later this morning and will need CCM to help with sedation.  VSD will need definitive treatment, either percutaneously via plug or open surgery.  Open surgery will be complicated by the need to stop ticagrelor (has new LAD stent) and ideally will need some time for tissues around  VSD to fibrose. Will discuss with Drs Lynnette Caffey and Cliffton Asters.    Anterior STEMI, CAD - HsTrop >24K on admission - s/p PCI to LAD 7/12, post DES 0% residual stenosis. Medical management of 40% stenosed RCA.  - plan for DAPT with ASA and Ticagrelor x12 months - Continue statin, LDL unable to be calculated with high TGD, continue Crestor 40.  - Complicated by infarct-related VSD.   3. Post-MI chest pain -possible pericarditis - No longer reporting pericardial-type pain.    DM II - A1c last 7.6 3/24, update - SSI while admitted per primary - has diabetic foot ulcer, WOC following - ABI 12/22 were normal   Obesity - Body mass index is 37.76 kg/m.  - consider GLP-1 as OP   OSA - does not use CPAP   HLD - Continue statin    CRITICAL CARE Performed by: Marca Ancona  Total critical care time: 45 minutes  Critical care time was exclusive of separately billable procedures and treating other patients.  Critical care was necessary to treat or prevent imminent or life-threatening deterioration.  Critical care was time spent personally by me (independent of midlevel providers or residents) on the following activities: development of treatment plan with patient and/or surrogate as well as nursing, discussions with consultants, evaluation of patient's response to treatment, examination of patient, obtaining history from patient or surrogate, ordering and performing treatments and interventions, ordering and review of laboratory studies, ordering and review of radiographic studies, pulse oximetry and re-evaluation of patient's condition.    Length of Stay: 3   Marca Ancona MD 05/07/2023, 6:57 AM  Advanced Heart Failure Team Pager [phone] (M-F; 7a - 4p)  Please contact [facility] for night-coverage after hours (4p -7a ) and weekends on [email or web address]

## 2023-05-07 NOTE — Interval H&P Note (Signed)
History and Physical Interval Note:  05/07/2023 9:40 AM  [name]  has presented today for surgery, with the diagnosis of STEMI.  The various methods of treatment have been discussed with the patient and family. After consideration of risks, benefits and other options for treatment, the patient has consented to  Procedure(s): IABP Insertion (N/A) RIGHT HEART CATH (N/A) as a surgical intervention.  The patient's history has been reviewed, patient examined, no change in status, stable for surgery.  I have reviewed the patient's chart and labs.  Questions were answered to the patient's satisfaction.     Sincere Berlanga [employer]

## 2023-05-07 NOTE — CV Procedure (Signed)
Procedure: TEE  Sedation: Per CCM  Indication: VSD  Findings: Please see echo section for full report.  Normal LV size with mild LV hypertrophy.  EF 55% with apical akinesis.  There is a VSD near the apex, measures 0.8-1 cm.  The RV is mildly dilated and mildly dysfunction.  Trivial TR.  Trivial MR.  Trileaflet aortic valve with no stenosis or regurgitation.  Mild left atrial enlargement, no LA appendage thrombus.  Mild right atrial enlargement.  No PFO/ASD by color doppler.  Normal caliber thoracic aorta with minimal plaque.   Findings: VSD well-visualized at LV apex.   Marca Ancona 05/07/2023 10:39 AM

## 2023-05-08 ENCOUNTER — Inpatient Hospital Stay (HOSPITAL_COMMUNITY): Payer: BC Managed Care – PPO

## 2023-05-08 ENCOUNTER — Ambulatory Visit (HOSPITAL_BASED_OUTPATIENT_CLINIC_OR_DEPARTMENT_OTHER): Payer: BC Managed Care – PPO | Admitting: Internal Medicine

## 2023-05-08 DIAGNOSIS — R570 Cardiogenic shock: Secondary | ICD-10-CM | POA: Diagnosis not present

## 2023-05-08 LAB — CBC
HCT: 29.9 % — ABNORMAL LOW (ref 36.0–46.0)
Hemoglobin: 9.7 g/dL — ABNORMAL LOW (ref 12.0–15.0)
MCH: 28.5 pg (ref 26.0–34.0)
MCHC: 32.4 g/dL (ref 30.0–36.0)
MCV: 87.9 fL (ref 80.0–100.0)
Platelets: 220 10*3/uL (ref 150–400)
RBC: 3.4 MIL/uL — ABNORMAL LOW (ref 3.87–5.11)
RDW: 14.2 % (ref 11.5–15.5)
WBC: 10.6 10*3/uL — ABNORMAL HIGH (ref 4.0–10.5)
nRBC: 0.4 % — ABNORMAL HIGH (ref 0.0–0.2)

## 2023-05-08 LAB — HEPARIN LEVEL (UNFRACTIONATED)
Heparin Unfractionated: <0.1 IU/mL — ABNORMAL LOW (ref 0.30–0.70)
Heparin Unfractionated: 0.12 IU/mL — ABNORMAL LOW (ref 0.30–0.70)

## 2023-05-08 LAB — GLUCOSE, CAPILLARY
Glucose-Capillary: 283 mg/dL — ABNORMAL HIGH (ref 70–99)
Glucose-Capillary: 349 mg/dL — ABNORMAL HIGH (ref 70–99)
Glucose-Capillary: 277 mg/dL — ABNORMAL HIGH (ref 70–99)

## 2023-05-08 LAB — BASIC METABOLIC PANEL
Anion gap: 13 (ref 5–15)
Anion gap: 15 (ref 5–15)
BUN: 22 mg/dL — ABNORMAL HIGH (ref 6–20)
BUN: 24 mg/dL — ABNORMAL HIGH (ref 6–20)
CO2: 23 mmol/L (ref 22–32)
CO2: 22 mmol/L (ref 22–32)
Calcium: 7.6 mg/dL — ABNORMAL LOW (ref 8.9–10.3)
Calcium: 7.8 mg/dL — ABNORMAL LOW (ref 8.9–10.3)
Chloride: 91 mmol/L — ABNORMAL LOW (ref 98–111)
Chloride: 91 mmol/L — ABNORMAL LOW (ref 98–111)
Creatinine, Ser: 1.56 mg/dL — ABNORMAL HIGH (ref 0.44–1.00)
Creatinine, Ser: 1.62 mg/dL — ABNORMAL HIGH (ref 0.44–1.00)
GFR, Estimated: 39 mL/min — ABNORMAL LOW (ref >60)
GFR, Estimated: 37 mL/min — ABNORMAL LOW (ref >60)
Glucose, Bld: 361 mg/dL — ABNORMAL HIGH (ref 70–99)
Glucose, Bld: 325 mg/dL — ABNORMAL HIGH (ref 70–99)
Potassium: 2.8 mmol/L — ABNORMAL LOW (ref 3.5–5.1)
Potassium: 3.1 mmol/L — ABNORMAL LOW (ref 3.5–5.1)
Sodium: 127 mmol/L — ABNORMAL LOW (ref 135–145)
Sodium: 128 mmol/L — ABNORMAL LOW (ref 135–145)

## 2023-05-08 LAB — LIPOPROTEIN A (LPA): Lipoprotein (a): <8.4 nmol/L (ref <75.0)

## 2023-05-08 LAB — COOXEMETRY PANEL
Carboxyhemoglobin: 1.5 % (ref 0.5–1.5)
Methemoglobin: <0.7 % (ref 0.0–1.5)
O2 Saturation: 60.5 %
Total hemoglobin: 8.5 g/dL — ABNORMAL LOW (ref 12.0–16.0)

## 2023-05-08 LAB — DIGOXIN LEVEL: Digoxin Level: 0.7 ng/mL — ABNORMAL LOW (ref 0.8–2.0)

## 2023-05-08 MED ORDER — SODIUM CHLORIDE 0.9 % IV SOLN
0.7500 ug/kg/min | INTRAVENOUS | Status: DC
  Administered 2023-05-08 – 2023-05-10 (×6): 0.75 ug/kg/min via INTRAVENOUS
  Filled 2023-05-08 (×8): qty 50

## 2023-05-08 MED ORDER — POTASSIUM CHLORIDE CRYS ER 20 MEQ PO TBCR
60.0000 meq | EXTENDED_RELEASE_TABLET | ORAL | Status: AC
  Administered 2023-05-08 (×2): 60 meq via ORAL
  Filled 2023-05-08 (×2): qty 3

## 2023-05-08 MED ORDER — INSULIN GLARGINE-YFGN 100 UNIT/ML ~~LOC~~ SOLN
12.0000 [IU] | Freq: Two times a day (BID) | SUBCUTANEOUS | Status: DC
  Administered 2023-05-08 (×2): 12 [IU] via SUBCUTANEOUS
  Filled 2023-05-08 (×4): qty 0.12

## 2023-05-08 MED ORDER — MELATONIN 5 MG PO TABS
10.0000 mg | ORAL_TABLET | Freq: Every day | ORAL | Status: DC
  Administered 2023-05-08 – 2023-05-10 (×3): 10 mg via ORAL
  Filled 2023-05-08 (×3): qty 2

## 2023-05-08 MED ORDER — METOLAZONE 2.5 MG PO TABS
2.5000 mg | ORAL_TABLET | Freq: Once | ORAL | Status: AC
  Administered 2023-05-08: 2.5 mg via ORAL
  Filled 2023-05-08: qty 1

## 2023-05-08 NOTE — Progress Notes (Signed)
ANTICOAGULATION CONSULT NOTE  Pharmacy Consult for heparin  Indication: IABP  Allergies  Allergen Reactions   Ibuprofen Other (See Comments)    Pt is unable to take this due to kidney problems.      Penicillins Rash and Other (See Comments)    Has patient had a PCN reaction causing immediate rash, facial/tongue/throat swelling, SOB or lightheadedness with hypotension: No Has patient had a PCN reaction causing severe rash involving mucus membranes or skin necrosis: No Has patient had a PCN reaction that required hospitalization No Has patient had a PCN reaction occurring within the last 10 years: No If all of the above answers are "NO", then may proceed with Cephalosporin use.    Patient Measurements: Height: 5\' 4"  (162.6 cm) Weight: 105 kg (231 lb 7.7 oz) IBW/kg (Calculated) : 54.7 HEPARIN DW (KG): 77.8    Vital Signs: Temp Source: Core (07/15 2000)  Labs: Recent Labs    05/05/23 1001 05/06/23 0255 05/06/23 0255 05/06/23 1920 05/06/23 2140 05/06/23 2144 05/07/23 0416 05/07/23 0730 05/07/23 1413 05/08/23 0529  HGB  --  10.1*  --   --    < > 9.5* 9.6*  --   --  9.7*  HCT  --  32.3*  --   --    < > 28.0* 29.9*  --   --  29.9*  PLT  --  239  --   --   --   --  226  --   --  220  APTT  --   --   --   --   --   --   --   --  44*  --   HEPARINUNFRC  --   --   --   --   --   --   --  0.25* 0.21* <0.10*  CREATININE  --  1.45*   < > 1.69*  --   --  1.64*  --   --  1.56*  TROPONINIHS >24,000*  --   --   --   --   --   --   --   --   --    < > = values in this interval not displayed.    Estimated Creatinine Clearance: 47 mL/min (A) (by C-G formula based on SCr of 1.56 mg/dL (H)).   Medical History: Past Medical History:  Diagnosis Date   Chronic cough    Chronic pain    Diabetes mellitus without complication (HCC)    Dyspnea    GERD (gastroesophageal reflux disease)    Hypokalemia    Hypothyroidism    Left wrist pain 06/01/2021   Myonecrosis (HCC) 07/01/2021    Neurocardiogenic syncope    OSA (obstructive sleep apnea)    does not use CPAP   Osteomyelitis (HCC)    bilateral feet   Peripheral neuropathy    Presence of intrathecal pump    recieves Prialt/bupivicaine   Tear of gluteus medius tendon 07/01/2021   Wegener's granulomatosis 2009   Wegner's disease (congenital syphilitic osteochondritis)     Medications:  Medications Prior to Admission  Medication Sig Dispense Refill Last Dose   acetaminophen (TYLENOL) 325 MG tablet Take 2 tablets (650 mg total) by mouth every 6 (six) hours as needed for mild pain (or Fever >/= 101).   Past Week   albuterol (VENTOLIN HFA) 108 (90 Base) MCG/ACT inhaler Inhale 2 puffs into the lungs every 6 (six) hours as needed for wheezing or shortness of breath. 1 each 11  unknown   amitriptyline (ELAVIL) 50 MG tablet TAKE 1 TABLET BY MOUTH AT BEDTIME FOR SLEEP. (Patient taking differently: Take 50 mg by mouth at bedtime. TAKE 1 TABLET BY MOUTH AT BEDTIME FOR SLEEP.) 30 tablet 0 05/03/2023   aspirin EC 81 MG tablet Take 162 mg by mouth daily. Swallow whole.   05/04/2023   Calcium Carb-Cholecalciferol (CALCIUM CARBONATE-VITAMIN D3 PO) Take 1 tablet by mouth daily.   05/03/2023   cyanocobalamin (VITAMIN B12) 1000 MCG tablet Take 1,000 mcg by mouth daily.   05/03/2023   DULoxetine (CYMBALTA) 60 MG capsule Take 60 mg by mouth 2 (two) times daily.   05/03/2023   levothyroxine (SYNTHROID) 25 MCG tablet Take 1 tablet (25 mcg total) by mouth daily. 30 tablet 0 05/03/2023   LINZESS 145 MCG CAPS capsule Take one capsule (145 mcg dose) by mouth daily. 30 capsule 0 Past Month   magnesium oxide (MAG-OX) 400 MG tablet Take 400 mg by mouth daily.   Past Month   metFORMIN (GLUCOPHAGE) 500 MG tablet Take 1 tablet (500 mg total) by mouth 2 (two) times daily with a meal. 180 tablet 3 05/03/2023   methocarbamol (ROBAXIN) 500 MG tablet Take 1 tablet (500 mg total) by mouth every 6 (six) hours as needed for muscle spasms. 30 tablet 0 Past Week    naloxone (NARCAN) nasal spray 4 mg/0.1 mL Place 0.4 mg into the nose once.   unknown   NUCYNTA 50 MG tablet Take 50 mg by mouth 3 (three) times daily.   05/03/2023   pantoprazole (PROTONIX) 40 MG tablet TAKE 1 TABLET BY MOUTH TWICE DAILY BEFORE A MEAL 30 tablet 0 05/03/2023   rosuvastatin (CRESTOR) 10 MG tablet TAKE 1 TABLET BY MOUTH AT BEDTIME 30 tablet 0 05/03/2023   topiramate (TOPAMAX) 100 MG tablet TAKE (1) TABLET BY MOUTH ONCE DAILY. 30 tablet 0 05/03/2023   Vitamin D, Ergocalciferol, (DRISDOL) 1.25 MG (50000 UNIT) CAPS capsule Take 1 capsule (50,000 Units total) by mouth every Friday. 7 capsule 3 Past Week   gabapentin (NEURONTIN) 300 MG capsule Take 1 capsule (300 mg total) by mouth at bedtime. (Patient taking differently: Take 600 mg by mouth 3 (three) times daily. Patient is taking 2 in the am, 2 in the afternoon, and 2 at bedtime.) 90 capsule 3    lidocaine (LIDODERM) 5 % Place 1 patch onto the skin daily as needed (for pain). Remove & Discard patch within 12 hours or as directed by MD      Scheduled:   amitriptyline  50 mg Oral QHS   aspirin EC  81 mg Oral Daily   Chlorhexidine Gluconate Cloth  6 each Topical Daily   digoxin  0.125 mg Oral Daily   DULoxetine  60 mg Oral BID   furosemide  80 mg Intravenous BID   gabapentin  600 mg Oral TID   insulin aspart  0-20 Units Subcutaneous TID WC   insulin aspart  0-5 Units Subcutaneous QHS   insulin glargine-yfgn  12 Units Subcutaneous BID   levothyroxine  25 mcg Oral Daily   linaclotide  145 mcg Oral QAC breakfast   magnesium oxide  400 mg Oral Daily   metolazone  2.5 mg Oral Once   pantoprazole  40 mg Oral BID AC   potassium chloride  60 mEq Oral Q4H   rosuvastatin  40 mg Oral Daily   senna  2 tablet Oral BID   sodium chloride flush  10-40 mL Intracatheter Q12H   sodium chloride flush  3 mL Intravenous Q12H   sodium chloride flush  3 mL Intravenous Q12H   tapentadol  50 mg Oral TID   topiramate  100 mg Oral Daily     Assessment: 57 yo female with VSD and dropping co-ox. She is noted s/p cath with IABP.   Heparin level <0.1 this morning - no infusion issues overnight or S/Sx bleeding. Cangrelor to start and ticagrelor to be held pre-OR.  Goal of Therapy:  Heparin level 0.2-0.5 units/ml Monitor platelets by anticoagulation protocol: Yes   Plan:  -Increase heparin to 1000 units/h -Recheck heparin level in 6h  Fredonia Highland, PharmD, [facility], [facility] Clinical Pharmacist [phone] Please check AMION for all [facility] numbers 05/08/2023

## 2023-05-08 NOTE — Plan of Care (Signed)

## 2023-05-08 NOTE — Progress Notes (Addendum)
Patient ID: [name], female   DOB: 1966-09-17, 57 y.o.   MRN: [id number]    Advanced Heart Failure Rounding Note   Subjective:    7/14: RHC mean RA 21, PA 50/25 mean 35, mean PCWP 28, CI 1.8 F/2.6 T, QpQs 3.3.  IABP placed.   No chest pain this morning. Still with cough but on room air.    She is on milrinone 0.375 this morning, IABP 1:1 on heparin gtt.    In NSR on amiodarone.   Scr 1.06 -> 1.35 -> 1.45 -> 1.69 -> 1.64 -> 1.56 Lactate 2.5 -> 1.6  Swan: CVP 15-16 PA 40/34 (poor waveform) CI 2.6 Co-ox 60.5%   Objective:   Weight Range:  Vital Signs:   Temp:  [98.2 F (36.8 C)-100.2 F (37.9 C)] 98.8 F (37.1 C) (07/15 1900) Pulse Rate:  [97-265] 97 (07/15 1900) Resp:  [15-27] 20 (07/15 1900) BP: (92-121)/(61-83) 113/68 (07/15 1900) SpO2:  [73 %-100 %] 99 % (07/15 1900) Last BM Date : 05/07/23  Weight change: Filed Weights   05/05/23 0500 05/06/23 0500 05/07/23 0500  Weight: 103.8 kg 105.2 kg 105 kg    Intake/Output:   Intake/Output Summary (Last 24 hours) at 05/08/2023 0739 Last data filed at 05/08/2023 0600 Gross per 24 hour  Intake 779.78 ml  Output 4700 ml  Net -3920.22 ml     Physical Exam: General: NAD Neck: JVP 14 cm, no thyromegaly or thyroid nodule.  Lungs: Clear to auscultation bilaterally with normal respiratory effort. CV: Nondisplaced PMI.  Heart regular S1/S2, no S3/S4, harsh murmur with systolic and diastolic components.  No peripheral edema.   Abdomen: Soft, nontender, no hepatosplenomegaly, no distention.  Skin: Intact without lesions or rashes.  Neurologic: Alert and oriented x 3.  Psych: Normal affect. Extremities: No clubbing or cyanosis.  HEENT: Normal.   Telemetry: SR 90s Personally reviewed  Labs: Basic Metabolic Panel: Recent Labs  Lab 05/05/23 0251 05/06/23 0255 05/06/23 1920 05/06/23 2140 05/06/23 2144 05/07/23 0416 05/08/23 0529  NA 133* 129* 127* 131* 131* 131* 127*  K 3.9 3.3* 4.3 4.2 4.1 3.7 2.8*  CL 103  99 94*  --   --  97* 91*  CO2 19* 20* 18*  --   --  19* 23  GLUCOSE 252* 277* 422*  --   --  311* 361*  BUN 19 22* 30*  --   --  27* 22*  CREATININE 1.35* 1.45* 1.69*  --   --  1.64* 1.56*  CALCIUM 8.7* 8.0* 7.8*  --   --  7.7* 7.6*  MG 1.8 2.3  --   --   --   --   --     Liver Function Tests: Recent Labs  Lab 05/04/23 0612  AST 31  ALT 21  ALKPHOS 137*  BILITOT 0.4  PROT 6.3*  ALBUMIN 3.5   No results for input(s): "LIPASE", "AMYLASE" in the last 168 hours. No results for input(s): "AMMONIA" in the last 168 hours.  CBC: Recent Labs  Lab 05/04/23 0612 05/05/23 0251 05/06/23 0255 05/06/23 2140 05/06/23 2144 05/07/23 0416 05/08/23 0529  WBC 16.1* 17.6* 14.3*  --   --  11.6* 10.6*  NEUTROABS 8.4*  --   --   --   --   --   --   HGB 13.1 11.3* 10.1* 9.5* 9.5* 9.6* 9.7*  HCT 41.2 36.4 32.3* 28.0* 28.0* 29.9* 29.9*  MCV 90.7 89.4 90.0  --   --  90.1 87.9  PLT  292 267 239  --   --  226 220    Cardiac Enzymes: No results for input(s): "CKTOTAL", "CKMB", "CKMBINDEX", "TROPONINI" in the last 168 hours.  BNP: BNP (last 3 results) Recent Labs    05/04/23 0620  BNP 123.2*    ProBNP (last 3 results) No results for input(s): "PROBNP" in the last 8760 hours.    Other results:  Imaging: DG CHEST PORT 1 VIEW  Result Date: 05/07/2023 CLINICAL DATA:  Congestive heart failure. EXAM: PORTABLE CHEST 1 VIEW COMPARISON:  May 04, 2023. FINDINGS: Stable cardiomegaly with mild central pulmonary vascular congestion. Right internal jugular Swan-Ganz catheter is noted with tip directed into right pulmonary artery. Mild pulmonary edema may be present. Bony thorax is unremarkable. Left-sided PICC line is noted with tip in expected position of the SVC. IMPRESSION: Stable cardiomegaly with mild central pulmonary vascular congestion. Mild pulmonary edema may be present. Right internal jugular Swan-Ganz catheter is noted with tip directed into right pulmonary artery. Electronically Signed    By: Lupita Raider M.D.   On: 05/07/2023 11:02   CARDIAC CATHETERIZATION  Result Date: 05/06/2023 Findings: RA = 21 RV = 49/25 PA = 50/25 (35) PCW = 28 Fick cardiac output/index = 3.8/1.8 Thermo 5.4/2.6 FA sat = 91% PA sat = 75% RA sat = 38% Qp/QS = 3.31 Assessment: 1. Cardiogenic shock with elevated filling pressures and large L-> R shunt Plan/Discussion: Continue IABP support and milrinone. TEE tomorrow to further evaluate VSD. Arvilla Meres, MD 10:28 PM   ECHOCARDIOGRAM LIMITED  Result Date: 05/06/2023    ECHOCARDIOGRAM LIMITED REPORT   Patient Name:   [name] Date of Exam: 05/06/2023 Medical Rec #:  [id number]   Height:       64.0 in Accession #:    [id number]  Weight:       231.9 lb Date of Birth:  1966-06-29   BSA:          2.083 m Patient Age:    57 years    BP:           87/64 mmHg Patient Gender: F           HR:           119 bpm. Exam Location:  Inpatient Procedure: Limited Echo, Color Doppler and Cardiac Doppler STAT ECHO Indications:    Murmur. Acute myocardial infarction.  History:        Patient has prior history of Echocardiogram examinations, most                 recent 05/04/2023. Risk Factors:Sleep Apnea and Diabetes.  Sonographer:    Delcie Roch RDCS Referring Phys: [id number] DANIEL R BENSIMHON IMPRESSIONS  1. LV function is overall vigorous but there is apical akinesis with evidence of an acute apical VSD with left to right flow. Left ventricular ejection fraction, by estimation, is 60 to 65%. The left ventricle has normal function. The left ventricle demonstrates regional wall motion abnormalities (see scoring diagram/findings for description). Left ventricular diastolic parameters are indeterminate.  2. Right ventricular systolic function is normal. There is normal pulmonary artery systolic pressure.  3. Left atrial size was mildly dilated.  4. Mild mitral valve regurgitation.  5. The aortic valve is tricuspid. Aortic valve regurgitation is not visualized. Aortic valve  sclerosis/calcification is present, without any evidence of aortic stenosis.  6. The inferior vena cava is dilated in size with <50% respiratory variability, suggesting right atrial pressure of 15 mmHg. Conclusion(s)/Recommendation(s): LV function is  overall vigorous but there is apical akinesis with evidence of an acute apical VSD with left to right flow. FINDINGS  Left Ventricle: LV function is overall vigorous but there is apical akinesis with evidence of an acute apical VSD with left to right flow. Left ventricular ejection fraction, by estimation, is 60 to 65%. The left ventricle has normal function. The left ventricle demonstrates regional wall motion abnormalities. Left ventricular diastolic parameters are indeterminate.  LV Wall Scoring: The apical lateral segment, apical septal segment, apical anterior segment, and apical inferior segment are akinetic. Right Ventricle: Right ventricular systolic function is normal. There is normal pulmonary artery systolic pressure. The tricuspid regurgitant velocity is 2.65 m/s, and with an assumed right atrial pressure of 5 mmHg, the estimated right ventricular systolic pressure is 33.1 mmHg. Left Atrium: Left atrial size was mildly dilated. Pericardium: Trivial pericardial effusion is present. The pericardial effusion is anterior to the right ventricle. Mitral Valve: Mild mitral valve regurgitation. Tricuspid Valve: Tricuspid valve regurgitation is trivial. Aortic Valve: The aortic valve is tricuspid. Aortic valve regurgitation is not visualized. Aortic valve sclerosis/calcification is present, without any evidence of aortic stenosis. Venous: The inferior vena cava is dilated in size with less than 50% respiratory variability, suggesting right atrial pressure of 15 mmHg. Additional Comments: Spectral Doppler performed. Color Doppler performed.  LEFT VENTRICLE PLAX 2D LVIDd:         3.60 cm LVIDs:         2.70 cm LV PW:         1.20 cm LV IVS:        1.00 cm  IVC IVC  diam: 2.00 cm LEFT ATRIUM         Index LA diam:    3.10 cm 1.49 cm/m  AORTIC VALVE             PULMONIC VALVE LVOT Vmax:   85.30 cm/s  RVOT Peak grad: 5 mmHg LVOT Vmean:  57.100 cm/s LVOT VTI:    0.106 m TRICUSPID VALVE TR Peak grad:   28.1 mmHg TR Vmax:        265.00 cm/s  SHUNTS Systemic VTI: 0.11 m Pulmonic VTI: 0.206 m Arvilla Meres MD Electronically signed by Arvilla Meres MD Signature Date/Time: 05/06/2023/9:40:03 AM    Final      Medications:     Scheduled Medications:  amitriptyline  50 mg Oral QHS   aspirin EC  81 mg Oral Daily   Chlorhexidine Gluconate Cloth  6 each Topical Daily   digoxin  0.125 mg Oral Daily   DULoxetine  60 mg Oral BID   furosemide  80 mg Intravenous BID   gabapentin  600 mg Oral TID   insulin aspart  0-20 Units Subcutaneous TID WC   insulin aspart  0-5 Units Subcutaneous QHS   insulin glargine-yfgn  15 Units Subcutaneous QHS   levothyroxine  25 mcg Oral Daily   linaclotide  145 mcg Oral QAC breakfast   magnesium oxide  400 mg Oral Daily   pantoprazole  40 mg Oral BID AC   potassium chloride  60 mEq Oral Q4H   rosuvastatin  40 mg Oral Daily   senna  2 tablet Oral BID   sodium chloride flush  10-40 mL Intracatheter Q12H   sodium chloride flush  3 mL Intravenous Q12H   sodium chloride flush  3 mL Intravenous Q12H   tapentadol  50 mg Oral TID   topiramate  100 mg Oral Daily    Infusions:  sodium chloride 10 mL/hr at 05/07/23 1900   sodium chloride Stopped (05/04/23 1630)   sodium chloride     sodium chloride     amiodarone 30 mg/hr (05/07/23 2123)   cangrelor (KENGREAL) 50 mg in sodium chloride 0.9 % 250 mL (0.2 mg/mL) infusion     heparin 800 Units/hr (05/07/23 1900)   milrinone 0.375 mcg/kg/min (05/08/23 0727)   norepinephrine (LEVOPHED) Adult infusion Stopped (05/07/23 0935)   vasopressin      PRN Medications: sodium chloride, sodium chloride, acetaminophen, albuterol, HYDROcodone-acetaminophen, lidocaine, methocarbamol, morphine  injection, ondansetron (ZOFRAN) IV, sodium chloride flush, sodium chloride flush, sodium chloride flush    Patient Profile    [name] is a 57 y.o. 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 Admitted with STEMI s/p PDI to LAD now in cardiogenic shock.    Assessment/Plan:   Cardiogenic shock -> acute HF with mildly-reduced EF complicated by apical VSD.  - Echo 6/23 EF 55%, no RWMA, normal RV, trivial MR - Echo 7/12,  EF 45% - Echo 7/14: EF 60% with apical VSD, normal RV. Apical VSD creates large shunt by RHC QpQs 3.3.  - IABP 1:1 on heparin gtt with milrinone 0.375.  CI 2.6 this morning.  - Heparin gtt for IABP.  - CVP 15-16 today with excellent diuresis yesterday (net negative 3920), will give Lasix 80 mg IV bid with 1 dose of metolazone. - Replace K.   - Continue digoxin (level ok today) - TEE 7/15 showed EF 55% with apical akinesis and apical VSD with significant L>R flow, RV mildly dilated and dysfunctional.  VSD will need definitive treatment, either percutaneously via plug or open surgery.  Open surgery will be complicated by the need to stop ticagrelor (has new LAD stent) and ideally will need some time for tissues around VSD to fibrose. Have discussed with Drs Cliffton Asters and Lynnette Caffey, plan will be to aim for surgical repair Friday.  Will stop Brilinta today and start Cangrelor.    Anterior STEMI, CAD - HsTrop >24K on admission - s/p PCI to LAD 7/12, post DES 0% residual stenosis. Medical management of 40% stenosed RCA.  - plan for DAPT with ASA and Ticagrelor x12 months - Continue statin, LDL unable to be calculated with high TGD, continue Crestor 40.  - Complicated by infarct-related VSD.   3. Post-MI chest pain -possible pericarditis - No longer reporting pericardial-type pain.    DM II - A1c last 7.6 3/24, update - SSI while admitted per primary - has diabetic foot  ulcer, WOC following - ABI 12/22 were normal   Obesity - Body mass index is 37.76 kg/m.  - consider GLP-1 as OP   OSA - does not use CPAP   HLD - Continue statin  7. AKI - Stable creatinine today at 1.56  8. Hyponatremia - Na lower at 127, hypervolemic hyponatremia. Fluid restrict.     CRITICAL CARE Performed by: Marca Ancona  Total critical care time: 45 minutes  Critical care time was exclusive of separately billable procedures and treating other patients.  Critical care was necessary to treat or prevent imminent or life-threatening deterioration.  Critical care was time spent personally by me (independent of midlevel providers or residents) on the following activities: development of treatment plan with patient and/or surrogate as well as nursing, discussions with consultants, evaluation of patient's response to treatment, examination of patient, obtaining history from patient or surrogate, ordering  and performing treatments and interventions, ordering and review of laboratory studies, ordering and review of radiographic studies, pulse oximetry and re-evaluation of patient's condition.    Length of Stay: 4   Marca Ancona MD 05/08/2023, 7:39 AM  Advanced Heart Failure Team Pager [phone] (M-F; 7a - 4p)  Please contact [facility] for night-coverage after hours (4p -7a ) and weekends on [email or web address]

## 2023-05-08 NOTE — Progress Notes (Signed)
ANTICOAGULATION CONSULT NOTE  Pharmacy Consult for heparin  Indication: IABP  Allergies  Allergen Reactions   Ibuprofen Other (See Comments)    Pt is unable to take this due to kidney problems.      Penicillins Rash and Other (See Comments)    Has patient had a PCN reaction causing immediate rash, facial/tongue/throat swelling, SOB or lightheadedness with hypotension: No Has patient had a PCN reaction causing severe rash involving mucus membranes or skin necrosis: No Has patient had a PCN reaction that required hospitalization No Has patient had a PCN reaction occurring within the last 10 years: No If all of the above answers are "NO", then may proceed with Cephalosporin use.    Patient Measurements: Height: 5\' 4"  (162.6 cm) Weight: 105 kg (231 lb 7.7 oz) IBW/kg (Calculated) : 54.7 HEPARIN DW (KG): 77.8    Vital Signs: Temp: 99.3 F (37.4 C) (07/16 1600) Temp Source: Core (07/16 1600) BP: 109/53 (07/16 1600) Pulse Rate: 184 (07/16 1600)  Labs: Recent Labs    05/06/23 0255 05/06/23 1920 05/06/23 2144 05/07/23 0416 05/07/23 0730 05/07/23 1413 05/08/23 0529 05/08/23 1454  HGB 10.1*   < > 9.5* 9.6*  --   --  9.7*  --   HCT 32.3*   < > 28.0* 29.9*  --   --  29.9*  --   PLT 239  --   --  226  --   --  220  --   APTT  --   --   --   --   --  44*  --   --   HEPARINUNFRC  --   --   --   --    < > 0.21* <0.10* 0.12*  CREATININE 1.45*   < >  --  1.64*  --   --  1.56* 1.62*   < > = values in this interval not displayed.    Estimated Creatinine Clearance: 45.2 mL/min (A) (by C-G formula based on SCr of 1.62 mg/dL (H)).   Medical History: Past Medical History:  Diagnosis Date   Chronic cough    Chronic pain    Diabetes mellitus without complication (HCC)    Dyspnea    GERD (gastroesophageal reflux disease)    Hypokalemia    Hypothyroidism    Left wrist pain 06/01/2021   Myonecrosis (HCC) 07/01/2021   Neurocardiogenic syncope    OSA (obstructive sleep apnea)     does not use CPAP   Osteomyelitis (HCC)    bilateral feet   Peripheral neuropathy    Presence of intrathecal pump    recieves Prialt/bupivicaine   Tear of gluteus medius tendon 07/01/2021   Wegener's granulomatosis 2009   Wegner's disease (congenital syphilitic osteochondritis)     Medications:  Medications Prior to Admission  Medication Sig Dispense Refill Last Dose   acetaminophen (TYLENOL) 325 MG tablet Take 2 tablets (650 mg total) by mouth every 6 (six) hours as needed for mild pain (or Fever >/= 101).   Past Week   albuterol (VENTOLIN HFA) 108 (90 Base) MCG/ACT inhaler Inhale 2 puffs into the lungs every 6 (six) hours as needed for wheezing or shortness of breath. 1 each 11 unknown   amitriptyline (ELAVIL) 50 MG tablet TAKE 1 TABLET BY MOUTH AT BEDTIME FOR SLEEP. (Patient taking differently: Take 50 mg by mouth at bedtime. TAKE 1 TABLET BY MOUTH AT BEDTIME FOR SLEEP.) 30 tablet 0 05/03/2023   aspirin EC 81 MG tablet Take 162 mg by mouth daily.  Swallow whole.   05/04/2023   Calcium Carb-Cholecalciferol (CALCIUM CARBONATE-VITAMIN D3 PO) Take 1 tablet by mouth daily.   05/03/2023   cyanocobalamin (VITAMIN B12) 1000 MCG tablet Take 1,000 mcg by mouth daily.   05/03/2023   DULoxetine (CYMBALTA) 60 MG capsule Take 60 mg by mouth 2 (two) times daily.   05/03/2023   levothyroxine (SYNTHROID) 25 MCG tablet Take 1 tablet (25 mcg total) by mouth daily. 30 tablet 0 05/03/2023   LINZESS 145 MCG CAPS capsule Take one capsule (145 mcg dose) by mouth daily. 30 capsule 0 Past Month   magnesium oxide (MAG-OX) 400 MG tablet Take 400 mg by mouth daily.   Past Month   metFORMIN (GLUCOPHAGE) 500 MG tablet Take 1 tablet (500 mg total) by mouth 2 (two) times daily with a meal. 180 tablet 3 05/03/2023   methocarbamol (ROBAXIN) 500 MG tablet Take 1 tablet (500 mg total) by mouth every 6 (six) hours as needed for muscle spasms. 30 tablet 0 Past Week   naloxone (NARCAN) nasal spray 4 mg/0.1 mL Place 0.4 mg into the nose  once.   unknown   NUCYNTA 50 MG tablet Take 50 mg by mouth 3 (three) times daily.   05/03/2023   pantoprazole (PROTONIX) 40 MG tablet TAKE 1 TABLET BY MOUTH TWICE DAILY BEFORE A MEAL 30 tablet 0 05/03/2023   rosuvastatin (CRESTOR) 10 MG tablet TAKE 1 TABLET BY MOUTH AT BEDTIME 30 tablet 0 05/03/2023   topiramate (TOPAMAX) 100 MG tablet TAKE (1) TABLET BY MOUTH ONCE DAILY. 30 tablet 0 05/03/2023   Vitamin D, Ergocalciferol, (DRISDOL) 1.25 MG (50000 UNIT) CAPS capsule Take 1 capsule (50,000 Units total) by mouth every Friday. 7 capsule 3 Past Week   gabapentin (NEURONTIN) 300 MG capsule Take 1 capsule (300 mg total) by mouth at bedtime. (Patient taking differently: Take 600 mg by mouth 3 (three) times daily. Patient is taking 2 in the am, 2 in the afternoon, and 2 at bedtime.) 90 capsule 3    lidocaine (LIDODERM) 5 % Place 1 patch onto the skin daily as needed (for pain). Remove & Discard patch within 12 hours or as directed by MD      Scheduled:   amitriptyline  50 mg Oral QHS   aspirin EC  81 mg Oral Daily   Chlorhexidine Gluconate Cloth  6 each Topical Daily   digoxin  0.125 mg Oral Daily   DULoxetine  60 mg Oral BID   furosemide  80 mg Intravenous BID   gabapentin  600 mg Oral TID   insulin aspart  0-20 Units Subcutaneous TID WC   insulin aspart  0-5 Units Subcutaneous QHS   insulin glargine-yfgn  12 Units Subcutaneous BID   levothyroxine  25 mcg Oral Daily   linaclotide  145 mcg Oral QAC breakfast   magnesium oxide  400 mg Oral Daily   metolazone  2.5 mg Oral Once   pantoprazole  40 mg Oral BID AC   potassium chloride  60 mEq Oral Q4H   rosuvastatin  40 mg Oral Daily   senna  2 tablet Oral BID   sodium chloride flush  10-40 mL Intracatheter Q12H   sodium chloride flush  3 mL Intravenous Q12H   sodium chloride flush  3 mL Intravenous Q12H   tapentadol  50 mg Oral TID   topiramate  100 mg Oral Daily    Assessment: 57 yo female with VSD and dropping co-ox. She is noted s/p cath with  IABP.  Heparin level trending up but still < goal 0.12 on heparin drip 1000 uts/hr  - no infusion issues overnight or S/Sx bleeding. Cangrelor started and ticagrelor to be pre-OR. Increase heparin drip slowly as monitor for bleeding   Goal of Therapy:  Heparin level 0.2-0.5 units/ml Monitor platelets by anticoagulation protocol: Yes   Plan:  -Increase heparin to 1050 units/h Daily heparin level and CBC  Leota Sauers Pharm.D. CPP, BCPS Clinical Pharmacist [phone] 05/08/2023 4:46 PM   Please check AMION for all [facility] Pharm[facility] numbers 05/08/2023

## 2023-05-09 ENCOUNTER — Ambulatory Visit: Payer: BC Managed Care – PPO | Admitting: Gastroenterology

## 2023-05-09 DIAGNOSIS — I2102 ST elevation (STEMI) myocardial infarction involving left anterior descending coronary artery: Secondary | ICD-10-CM | POA: Diagnosis not present

## 2023-05-09 DIAGNOSIS — R570 Cardiogenic shock: Secondary | ICD-10-CM | POA: Diagnosis not present

## 2023-05-09 LAB — CBC
HCT: 31.6 % — ABNORMAL LOW (ref 36.0–46.0)
Hemoglobin: 10.3 g/dL — ABNORMAL LOW (ref 12.0–15.0)
MCH: 28 pg (ref 26.0–34.0)
MCHC: 32.6 g/dL (ref 30.0–36.0)
MCV: 85.9 fL (ref 80.0–100.0)
Platelets: 223 10*3/uL (ref 150–400)
RBC: 3.68 MIL/uL — ABNORMAL LOW (ref 3.87–5.11)
RDW: 14.1 % (ref 11.5–15.5)
WBC: 10.9 10*3/uL — ABNORMAL HIGH (ref 4.0–10.5)
nRBC: 0.6 % — ABNORMAL HIGH (ref 0.0–0.2)

## 2023-05-09 LAB — COOXEMETRY PANEL
Carboxyhemoglobin: 1.4 % (ref 0.5–1.5)
Methemoglobin: <0.7 % (ref 0.0–1.5)
O2 Saturation: 57.8 %
Total hemoglobin: 11 g/dL — ABNORMAL LOW (ref 12.0–16.0)

## 2023-05-09 LAB — GLUCOSE, CAPILLARY
Glucose-Capillary: 286 mg/dL — ABNORMAL HIGH (ref 70–99)
Glucose-Capillary: 276 mg/dL — ABNORMAL HIGH (ref 70–99)
Glucose-Capillary: 317 mg/dL — ABNORMAL HIGH (ref 70–99)
Glucose-Capillary: 333 mg/dL — ABNORMAL HIGH (ref 70–99)
Glucose-Capillary: 243 mg/dL — ABNORMAL HIGH (ref 70–99)
Glucose-Capillary: 247 mg/dL — ABNORMAL HIGH (ref 70–99)

## 2023-05-09 LAB — BASIC METABOLIC PANEL
Anion gap: 17 — ABNORMAL HIGH (ref 5–15)
Anion gap: 13 (ref 5–15)
BUN: 23 mg/dL — ABNORMAL HIGH (ref 6–20)
BUN: 23 mg/dL — ABNORMAL HIGH (ref 6–20)
CO2: 25 mmol/L (ref 22–32)
CO2: 25 mmol/L (ref 22–32)
Calcium: 8.6 mg/dL — ABNORMAL LOW (ref 8.9–10.3)
Calcium: 8.4 mg/dL — ABNORMAL LOW (ref 8.9–10.3)
Chloride: 90 mmol/L — ABNORMAL LOW (ref 98–111)
Chloride: 91 mmol/L — ABNORMAL LOW (ref 98–111)
Creatinine, Ser: 1.7 mg/dL — ABNORMAL HIGH (ref 0.44–1.00)
Creatinine, Ser: 1.65 mg/dL — ABNORMAL HIGH (ref 0.44–1.00)
GFR, Estimated: 35 mL/min — ABNORMAL LOW (ref >60)
GFR, Estimated: 36 mL/min — ABNORMAL LOW (ref >60)
Glucose, Bld: 339 mg/dL — ABNORMAL HIGH (ref 70–99)
Glucose, Bld: 357 mg/dL — ABNORMAL HIGH (ref 70–99)
Potassium: 3.3 mmol/L — ABNORMAL LOW (ref 3.5–5.1)
Potassium: 3.2 mmol/L — ABNORMAL LOW (ref 3.5–5.1)
Sodium: 132 mmol/L — ABNORMAL LOW (ref 135–145)
Sodium: 129 mmol/L — ABNORMAL LOW (ref 135–145)

## 2023-05-09 LAB — HEPARIN LEVEL (UNFRACTIONATED): Heparin Unfractionated: 0.16 IU/mL — ABNORMAL LOW (ref 0.30–0.70)

## 2023-05-09 MED ORDER — POTASSIUM CHLORIDE 10 MEQ/50ML IV SOLN
10.0000 meq | INTRAVENOUS | Status: AC
  Administered 2023-05-09 (×2): 10 meq via INTRAVENOUS
  Filled 2023-05-09 (×2): qty 50

## 2023-05-09 MED ORDER — POTASSIUM CHLORIDE CRYS ER 20 MEQ PO TBCR
40.0000 meq | EXTENDED_RELEASE_TABLET | Freq: Once | ORAL | Status: AC
  Administered 2023-05-09: 40 meq via ORAL
  Filled 2023-05-09: qty 2

## 2023-05-09 MED ORDER — INSULIN ASPART 100 UNIT/ML IJ SOLN
5.0000 [IU] | Freq: Three times a day (TID) | INTRAMUSCULAR | Status: DC
  Administered 2023-05-09 – 2023-05-10 (×6): 5 [IU] via SUBCUTANEOUS

## 2023-05-09 MED ORDER — INSULIN GLARGINE-YFGN 100 UNIT/ML ~~LOC~~ SOLN
20.0000 [IU] | Freq: Two times a day (BID) | SUBCUTANEOUS | Status: DC
  Administered 2023-05-09 – 2023-05-10 (×3): 20 [IU] via SUBCUTANEOUS
  Filled 2023-05-09 (×4): qty 0.2

## 2023-05-09 NOTE — Progress Notes (Signed)
ANTICOAGULATION CONSULT NOTE  Pharmacy Consult for heparin  Indication: IABP  Allergies  Allergen Reactions   Ibuprofen Other (See Comments)    Pt is unable to take this due to kidney problems.      Penicillins Rash and Other (See Comments)    Has patient had a PCN reaction causing immediate rash, facial/tongue/throat swelling, SOB or lightheadedness with hypotension: No Has patient had a PCN reaction causing severe rash involving mucus membranes or skin necrosis: No Has patient had a PCN reaction that required hospitalization No Has patient had a PCN reaction occurring within the last 10 years: No If all of the above answers are "NO", then may proceed with Cephalosporin use.    Patient Measurements: Height: 5\' 4"  (162.6 cm) Weight: 99.9 kg (220 lb 3.8 oz) IBW/kg (Calculated) : 54.7 HEPARIN DW (KG): 77.8    Vital Signs: Temp: 97.7 F (36.5 C) (07/17 0800) Temp Source: Core (07/17 0800) BP: 104/63 (07/17 0800) Pulse Rate: 179 (07/17 0800)  Labs: Recent Labs    05/07/23 0416 05/07/23 0730 05/07/23 1413 05/08/23 0529 05/08/23 1454 05/09/23 0452  HGB 9.6*  --   --  9.7*  --  10.3*  HCT 29.9*  --   --  29.9*  --  31.6*  PLT 226  --   --  220  --  223  APTT  --   --  44*  --   --   --   HEPARINUNFRC  --    < > 0.21* <0.10* 0.12* 0.16*  CREATININE 1.64*  --   --  1.56* 1.62* 1.70*   < > = values in this interval not displayed.    Estimated Creatinine Clearance: 42 mL/min (A) (by C-G formula based on SCr of 1.7 mg/dL (H)).   Medical History: Past Medical History:  Diagnosis Date   Chronic cough    Chronic pain    Diabetes mellitus without complication (HCC)    Dyspnea    GERD (gastroesophageal reflux disease)    Hypokalemia    Hypothyroidism    Left wrist pain 06/01/2021   Myonecrosis (HCC) 07/01/2021   Neurocardiogenic syncope    OSA (obstructive sleep apnea)    does not use CPAP   Osteomyelitis (HCC)    bilateral feet   Peripheral neuropathy    Presence  of intrathecal pump    recieves Prialt/bupivicaine   Tear of gluteus medius tendon 07/01/2021   Wegener's granulomatosis 2009   Wegner's disease (congenital syphilitic osteochondritis)     Medications:  Medications Prior to Admission  Medication Sig Dispense Refill Last Dose   acetaminophen (TYLENOL) 325 MG tablet Take 2 tablets (650 mg total) by mouth every 6 (six) hours as needed for mild pain (or Fever >/= 101).   Past Week   albuterol (VENTOLIN HFA) 108 (90 Base) MCG/ACT inhaler Inhale 2 puffs into the lungs every 6 (six) hours as needed for wheezing or shortness of breath. 1 each 11 unknown   amitriptyline (ELAVIL) 50 MG tablet TAKE 1 TABLET BY MOUTH AT BEDTIME FOR SLEEP. (Patient taking differently: Take 50 mg by mouth at bedtime. TAKE 1 TABLET BY MOUTH AT BEDTIME FOR SLEEP.) 30 tablet 0 05/03/2023   aspirin EC 81 MG tablet Take 162 mg by mouth daily. Swallow whole.   05/04/2023   Calcium Carb-Cholecalciferol (CALCIUM CARBONATE-VITAMIN D3 PO) Take 1 tablet by mouth daily.   05/03/2023   cyanocobalamin (VITAMIN B12) 1000 MCG tablet Take 1,000 mcg by mouth daily.   05/03/2023  DULoxetine (CYMBALTA) 60 MG capsule Take 60 mg by mouth 2 (two) times daily.   05/03/2023   levothyroxine (SYNTHROID) 25 MCG tablet Take 1 tablet (25 mcg total) by mouth daily. 30 tablet 0 05/03/2023   LINZESS 145 MCG CAPS capsule Take one capsule (145 mcg dose) by mouth daily. 30 capsule 0 Past Month   magnesium oxide (MAG-OX) 400 MG tablet Take 400 mg by mouth daily.   Past Month   metFORMIN (GLUCOPHAGE) 500 MG tablet Take 1 tablet (500 mg total) by mouth 2 (two) times daily with a meal. 180 tablet 3 05/03/2023   methocarbamol (ROBAXIN) 500 MG tablet Take 1 tablet (500 mg total) by mouth every 6 (six) hours as needed for muscle spasms. 30 tablet 0 Past Week   naloxone (NARCAN) nasal spray 4 mg/0.1 mL Place 0.4 mg into the nose once.   unknown   NUCYNTA 50 MG tablet Take 50 mg by mouth 3 (three) times daily.   05/03/2023    pantoprazole (PROTONIX) 40 MG tablet TAKE 1 TABLET BY MOUTH TWICE DAILY BEFORE A MEAL 30 tablet 0 05/03/2023   rosuvastatin (CRESTOR) 10 MG tablet TAKE 1 TABLET BY MOUTH AT BEDTIME 30 tablet 0 05/03/2023   topiramate (TOPAMAX) 100 MG tablet TAKE (1) TABLET BY MOUTH ONCE DAILY. 30 tablet 0 05/03/2023   Vitamin D, Ergocalciferol, (DRISDOL) 1.25 MG (50000 UNIT) CAPS capsule Take 1 capsule (50,000 Units total) by mouth every Friday. 7 capsule 3 Past Week   gabapentin (NEURONTIN) 300 MG capsule Take 1 capsule (300 mg total) by mouth at bedtime. (Patient taking differently: Take 600 mg by mouth 3 (three) times daily. Patient is taking 2 in the am, 2 in the afternoon, and 2 at bedtime.) 90 capsule 3    lidocaine (LIDODERM) 5 % Place 1 patch onto the skin daily as needed (for pain). Remove & Discard patch within 12 hours or as directed by MD      Scheduled:   amitriptyline  50 mg Oral QHS   aspirin EC  81 mg Oral Daily   Chlorhexidine Gluconate Cloth  6 each Topical Daily   digoxin  0.125 mg Oral Daily   DULoxetine  60 mg Oral BID   furosemide  80 mg Intravenous BID   gabapentin  600 mg Oral TID   insulin aspart  0-20 Units Subcutaneous TID WC   insulin aspart  0-5 Units Subcutaneous QHS   insulin aspart  5 Units Subcutaneous TID WC   insulin glargine-yfgn  20 Units Subcutaneous BID   levothyroxine  25 mcg Oral Daily   linaclotide  145 mcg Oral QAC breakfast   magnesium oxide  400 mg Oral Daily   melatonin  10 mg Oral QHS   pantoprazole  40 mg Oral BID AC   rosuvastatin  40 mg Oral Daily   senna  2 tablet Oral BID   sodium chloride flush  10-40 mL Intracatheter Q12H   sodium chloride flush  3 mL Intravenous Q12H   sodium chloride flush  3 mL Intravenous Q12H   tapentadol  50 mg Oral TID   topiramate  100 mg Oral Daily    Assessment: 57 yo female with VSD and dropping co-ox. She is noted s/p cath with IABP.   Heparin level 0.16 this morning - no bleeding issues, cangrelor running as well, CBC  stable.  Goal of Therapy:  Heparin level 0.2-0.5 units/ml Monitor platelets by anticoagulation protocol: Yes   Plan:  -Increase heparin to 1150 units/h -Daily  heparin level and CBC  Fredonia Highland, PharmD, [place], [facility] Clinical Pharmacist [phone] Please check AMION for all [facility] Pharmacy numbers 05/09/2023

## 2023-05-09 NOTE — Progress Notes (Addendum)
Patient ID: [name], female   DOB: 07-Apr-1966, 57 y.o.   MRN: [id number]    Advanced Heart Failure Rounding Note   Subjective:    7/14: RHC mean RA 21, PA 50/25 mean 35, mean PCWP 28, CI 1.8 F/2.6 T, QpQs 3.3.  IABP placed.   Remains on IABP at 1:1 + Milrinone 0.375. Co-ox 58%. 5.1L in UOP yesterday w/ IV Lasix + metolazone.   Wt down 11 lb in the last 2 days. CVP 9. Scr stable 1.69 -> 1.64 -> 1.56->1.70  K 3.3  Swan: CVP 9 PA (poor waveform) CI 3.8 Co-ox 58%  Feels ok this morning. Notes mild occasional chest discomfort but no dyspnea.    Objective:   Weight Range:  Vital Signs:   Temp:  [97 F (36.1 C)-99.9 F (37.7 C)] 97.5 F (36.4 C) (07/17 0700) Pulse Rate:  [96-278] 181 (07/17 0700) Resp:  [13-28] 20 (07/17 0700) BP: (89-137)/(51-106) 117/64 (07/17 0500) SpO2:  [86 %-98 %] 97 % (07/17 0700) Weight:  [99.9 kg] 99.9 kg (07/17 0500) Last BM Date : 05/07/23  Weight change: Filed Weights   05/06/23 0500 05/07/23 0500 05/09/23 0500  Weight: 105.2 kg 105 kg 99.9 kg    Intake/Output:   Intake/Output Summary (Last 24 hours) at 05/09/2023 0803 Last data filed at 05/09/2023 0700 Gross per 24 hour  Intake 2730.86 ml  Output 5075 ml  Net -2344.14 ml     Physical Exam: CVP 9  General:  Well appearing, moderately obese. No respiratory difficulty HEENT: normal Neck: supple. JVD 9 cm. Carotids 2+ bilat; no bruits. No lymphadenopathy or thyromegaly appreciated. Cor: PMI nondisplaced. Regular rate & rhythm.  Harsh systolic and diastolic murmur  Lungs: clear Abdomen: soft, nontender, nondistended. No hepatosplenomegaly. No bruits or masses. Good bowel sounds. Extremities: no cyanosis, clubbing, rash, trace b/l LE edema, b/l TMT amputation. Rt fem IABP site stable. No bleeding   Neuro: alert & oriented x 3, cranial nerves grossly intact. moves all 4 extremities w/o difficulty. Affect pleasant.   Telemetry: SR 90s Personally reviewed  Labs: Basic Metabolic  Panel: Recent Labs  Lab 05/05/23 0251 05/06/23 0255 05/06/23 1920 05/06/23 2140 05/06/23 2144 05/07/23 0416 05/08/23 0529 05/08/23 1454 05/09/23 0452  NA 133* 129* 127*   < > 131* 131* 127* 128* 132*  K 3.9 3.3* 4.3   < > 4.1 3.7 2.8* 3.1* 3.3*  CL 103 99 94*  --   --  97* 91* 91* 90*  CO2 19* 20* 18*  --   --  19* 23 22 25   GLUCOSE 252* 277* 422*  --   --  311* 361* 325* 339*  BUN 19 22* 30*  --   --  27* 22* 24* 23*  CREATININE 1.35* 1.45* 1.69*  --   --  1.64* 1.56* 1.62* 1.70*  CALCIUM 8.7* 8.0* 7.8*  --   --  7.7* 7.6* 7.8* 8.6*  MG 1.8 2.3  --   --   --   --   --   --   --    < > = values in this interval not displayed.    Liver Function Tests: Recent Labs  Lab 05/04/23 0612  AST 31  ALT 21  ALKPHOS 137*  BILITOT 0.4  PROT 6.3*  ALBUMIN 3.5   No results for input(s): "LIPASE", "AMYLASE" in the last 168 hours. No results for input(s): "AMMONIA" in the last 168 hours.  CBC: Recent Labs  Lab 05/04/23 0612 05/05/23 0251 05/06/23 0255  05/06/23 2140 05/06/23 2144 05/07/23 0416 05/08/23 0529 05/09/23 0452  WBC 16.1* 17.6* 14.3*  --   --  11.6* 10.6* 10.9*  NEUTROABS 8.4*  --   --   --   --   --   --   --   HGB 13.1 11.3* 10.1* 9.5* 9.5* 9.6* 9.7* 10.3*  HCT 41.2 36.4 32.3* 28.0* 28.0* 29.9* 29.9* 31.6*  MCV 90.7 89.4 90.0  --   --  90.1 87.9 85.9  PLT 292 267 239  --   --  226 220 223    Cardiac Enzymes: No results for input(s): "CKTOTAL", "CKMB", "CKMBINDEX", "TROPONINI" in the last 168 hours.  BNP: BNP (last 3 results) Recent Labs    05/04/23 0620  BNP 123.2*    ProBNP (last 3 results) No results for input(s): "PROBNP" in the last 8760 hours.    Other results:  Imaging: DG CHEST PORT 1 VIEW  Result Date: 05/08/2023 CLINICAL DATA:  CHF EXAM: PORTABLE CHEST 1 VIEW COMPARISON:  Previous studies including the examination done on 05/07/2023 FINDINGS: Transverse diameter of heart is increased. Central pulmonary vessels are less prominent. There  is small linear metallic density overlying the proximal descending thoracic aorta, possibly marker in intra-aortic balloon pump. There are no signs of alveolar pulmonary edema or focal pulmonary consolidation. There is poor inspiration. There is no pleural effusion or pneumothorax. Tip of right IJ Swan-Ganz catheter is noted in the region of right lower lobe pulmonary artery. Tip of left PICC line is seen in the course of the superior vena cava. IMPRESSION: Cardiomegaly. There is interval decrease in pulmonary vascular congestion and pulmonary edema. There are no new focal infiltrates. Support devices as described. Electronically Signed   By: Ernie Avena M.D.   On: 05/08/2023 12:49     Medications:     Scheduled Medications:  amitriptyline  50 mg Oral QHS   aspirin EC  81 mg Oral Daily   Chlorhexidine Gluconate Cloth  6 each Topical Daily   digoxin  0.125 mg Oral Daily   DULoxetine  60 mg Oral BID   furosemide  80 mg Intravenous BID   gabapentin  600 mg Oral TID   insulin aspart  0-20 Units Subcutaneous TID WC   insulin aspart  0-5 Units Subcutaneous QHS   insulin glargine-yfgn  12 Units Subcutaneous BID   levothyroxine  25 mcg Oral Daily   linaclotide  145 mcg Oral QAC breakfast   magnesium oxide  400 mg Oral Daily   melatonin  10 mg Oral QHS   pantoprazole  40 mg Oral BID AC   rosuvastatin  40 mg Oral Daily   senna  2 tablet Oral BID   sodium chloride flush  10-40 mL Intracatheter Q12H   sodium chloride flush  3 mL Intravenous Q12H   sodium chloride flush  3 mL Intravenous Q12H   tapentadol  50 mg Oral TID   topiramate  100 mg Oral Daily    Infusions:  sodium chloride 10 mL/hr at 05/09/23 0700   sodium chloride Stopped (05/04/23 1630)   sodium chloride     sodium chloride     amiodarone 30 mg/hr (05/09/23 0700)   cangrelor (KENGREAL) 50 mg in sodium chloride 0.9 % 250 mL (0.2 mg/mL) infusion 0.75 mcg/kg/min (05/09/23 0700)   heparin 1,050 Units/hr (05/09/23 0700)    milrinone 0.375 mcg/kg/min (05/09/23 0700)   norepinephrine (LEVOPHED) Adult infusion Stopped (05/07/23 0935)   vasopressin      PRN Medications:  sodium chloride, sodium chloride, acetaminophen, albuterol, HYDROcodone-acetaminophen, lidocaine, methocarbamol, morphine injection, ondansetron (ZOFRAN) IV, sodium chloride flush, sodium chloride flush, sodium chloride flush    Patient Profile    [name] is a 57 y.o. 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 Admitted with STEMI s/p PDI to LAD now in cardiogenic shock.    Assessment/Plan:   Cardiogenic shock -> acute HF with mildly-reduced EF complicated by apical VSD.  - Echo 6/23 EF 55%, no RWMA, normal RV, trivial MR - Echo 7/12,  EF 45% - Echo 7/14: EF 60% with apical VSD, normal RV. Apical VSD creates large shunt by RHC QpQs 3.3.  - IABP 1:1 on heparin gtt with milrinone 0.375.  CI 3.8 this morning. Co-ox 58%  - Heparin gtt for IABP.  - CVP 9 today with excellent diuresis yesterday. SCr stable   - Continue IV Lasix 80 mg and Replace K   - Continue digoxin 0.125  - TEE 7/15 showed EF 55% with apical akinesis and apical VSD with significant L>R flow, RV mildly dilated and dysfunctional.  VSD will need definitive treatment, either percutaneously via plug or open surgery.  Open surgery will be complicated by the need to stop ticagrelor (has new LAD stent) and ideally will need some time for tissues around VSD to fibrose. Have discussed with Drs Cliffton Asters and Lynnette Caffey, plan will be to aim for surgical repair Friday.  Holding Brilinta, covering w/ Cangrelor.    Anterior STEMI, CAD - HsTrop >24K on admission - s/p PCI to LAD 7/12, post DES 0% residual stenosis. Medical management of 40% stenosed RCA.  - plan for DAPT with ASA and Ticagrelor x12 months (covering w/ Cangrelor for now) - Continue statin, LDL unable to be calculated with  high TGD, continue Crestor 40.  - Complicated by infarct-related VSD.   3. Post-MI chest pain -possible pericarditis - No longer reporting pericardial-type pain.    DM II - A1c last 7.6 3/24, update - SSI while admitted per primary - has diabetic foot ulcer, WOC following - ABI 12/22 were normal   Obesity - Body mass index is 37.76 kg/m.  - consider GLP-1 as OP   OSA - does not use CPAP   HLD - Continue statin  7. AKI - Stable creatinine today at 1.7  8. Hyponatremia - Na 132, hypervolemic hyponatremia. Fluid restrict.     Length of Stay: 5   Brittainy Simmons PA-C  05/09/2023, 8:03 AM  Advanced Heart Failure Team Pager [phone] (M-F; 7a - 4p)  Please contact [facility] for night-coverage after hours (4p -7a ) and weekends on [email or web address]  Patient seen with PA, agree with the above note.   Stable symptomatically.  Good diuresis yesterday, net I/Os 2298.  Co-ox 58%, CI 3.8.  She remains on IABP 1:1 and milrinone 0.375. CVP 9 on my read today.   NSR on amiodarone 30 mg/hr.   General: NAD Neck: JVP 10 cm, no thyromegaly or thyroid nodule.  Lungs: Clear to auscultation bilaterally with normal respiratory effort. CV: Nondisplaced PMI.  Heart regular S1/S2, no S3/S4, harsh systolic/diastolic murmur.  Trace ankle edema.  Abdomen: Soft, nontender, no hepatosplenomegaly, no distention.  Skin: Intact without lesions or rashes.  Neurologic: Alert and oriented x 3.  Psych: Normal affect. Extremities: No clubbing or cyanosis. IABP right groin HEENT: Normal.   Keep current support with milrinone and IABP 1:1.  Platelets stable, on heparin  gtt.   CVP 9 today, creatinine slightly higher at 1.7.  Will give Lasix 80 mg IV bid today, no metolazone.  Replace K.   Have discussed with Drs Cliffton Asters and Lynnette Caffey, plan will be to aim for surgical VSD repair Friday.  Brilinta has been stopped and she is on Cangrelor for fresh stent.   CRITICAL CARE Performed by: Marca Ancona  Total critical care time: 40 minutes  Critical care time was exclusive of separately billable procedures and treating other patients.  Critical care was necessary to treat or prevent imminent or life-threatening deterioration.  Critical care was time spent personally by me on the following activities: development of treatment plan with patient and/or surrogate as well as nursing, discussions with consultants, evaluation of patient's response to treatment, examination of patient, obtaining history from patient or surrogate, ordering and performing treatments and interventions, ordering and review of laboratory studies, ordering and review of radiographic studies, pulse oximetry and re-evaluation of patient's condition.  Marca Ancona 05/09/2023 9:46 AM

## 2023-05-09 NOTE — Hospital Course (Addendum)
Referring: No ref. provider found Primary Care: [name], [name] Primary Cardiologist:Peter Swaziland, MD  History of Present Illness:     [name] 57 y.o. 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  She has a hx of DM and presented with an anterior STEMI.  She underwent PCI to the LAD with a good result.  Advanced heart failure team was consulted due to persistent hypotension and tachycardia, and she was started on chemical support.  Bedside echo showed the VSD.  CT surgery has been consulted to assist with management. She remains symptomatic with some shortness of breath, and chest pain. I would like to discuss with structural team to see if she is a candidate for a percutaneous closure device. If not, we would need to consider open repair, which will be difficult given her new stent. We also would need to consider options for mechanical support if she develops worsening LV failure after open repair.  Hospital Course:  [name] was not felt to be an appropriate candidate for a percutaneous closure device. She was medical optimized over the next few days and was taken to the OR on 05/11/23 where the VSD was repaired.  Following the procedure she was transferred back to the ICU on epinephrine, nor-epinephrine, and milrinone.  The IABP remained in place at 1:1 ratio. The critical care medicine team assisted with management in the ICU setting. She was weaned from the vent and extubated on POD1. The IABP was weaned down to a 1:3 ratio and removed on post-op day 1.  Her diabetes was managed initially with an insulin infusion and later transitioned back to long-acting insulin and SSI. By post-op day 2, the dobutamine was weaned off but NE, milrinone, and Vasopressin continued. Pacer wires were removed and Plavix was started for the recent LAD stent. She had an episode of coughing with hypoxia and bradycardia in the early morning on post-op day 3.  Hypoxia improved with   utilization of a bag valved mask and she was then placed on BiPAP. She was given a dose of Narcan and diuresed more aggressively with a Lasix infusion.  Expected acute blood loss anemia and thrombocytopenia were monitored closely post-op.  She was transfused with 1 unit PRBC's on post-op day 1.  The platelet count gradually normalized without intervention.  Acute kidney injury was monitored closely with gradual improvement in creatinine. The vasopressor and inotropic support were slowly weaned.  She has expected postoperative volume overload and is diuresing accordingly.  She did develop some right quadricep weakness and a CT of the abdomen is being obtained.  This is to rule out right pelvic hematoma.  Eliquis has been placed on hold but Plavix was continued for the LAD PCI.  Physical therapy and occupational therapists evaluated [name] and recommended inpatient rehab for additional strengthening prior to planned discharge to home.  The CIR team evaluated her as well and felt she was an appropriate candidate for CIR admission and initiated the process for getting insurance approval.  She was resumed on Eliquis for felt repair of VSD. WBC this am slightly increased to 18,600 to 07/31 but she remains afebrile. Pro calcitonin was 0.15. Per pulmonary, she was put on Maxipime and Vancomycin for possible pulmonary etiology (increased cough, shortness of breath). She has had a PICC line since 07/12. She has no sign of a sternal skin wound infection, no GU complaints (and UA negative for infection), and she had previous atelectasis (but no obvious pneumonia)  on CXR. Event of chest pain 08/01 with rapid response involvement. EKG showed SR with first degree heart block;no evidence of ischemia. CT of chest/abd/pelvis showed slight increase in pericardial fluid collection, fluid extending through the subcutaneous tissues, slight rim enhancement of the pericardial portion of the fluid collection, and superimposed infection  cannot be excluded. She will need to go to OR next week for I and D of sternum for infection/dehiscence.

## 2023-05-09 NOTE — Plan of Care (Signed)
  Problem: Education: Goal: Knowledge of General Education information will improve Description: Including pain rating scale, medication(s)/side effects and non-pharmacologic comfort measures Outcome: Progressing   Problem: Health Behavior/Discharge Planning: Goal: Ability to manage health-related needs will improve Outcome: Progressing   Problem: Clinical Measurements: Goal: Ability to maintain clinical measurements within normal limits will improve Outcome: Progressing Goal: Will remain free from infection Outcome: Progressing Goal: Diagnostic test results will improve Outcome: Progressing Goal: Cardiovascular complication will be avoided Outcome: Progressing   Problem: Activity: Goal: Risk for activity intolerance will decrease Outcome: Progressing   Problem: Nutrition: Goal: Adequate nutrition will be maintained Outcome: Progressing   Problem: Coping: Goal: Level of anxiety will decrease Outcome: Progressing   Problem: Elimination: Goal: Will not experience complications related to bowel motility Outcome: Progressing Goal: Will not experience complications related to urinary retention Outcome: Progressing   Problem: Pain Managment: Goal: General experience of comfort will improve Outcome: Progressing   Problem: Safety: Goal: Ability to remain free from injury will improve Outcome: Progressing   Problem: Skin Integrity: Goal: Risk for impaired skin integrity will decrease Outcome: Progressing   Problem: Education: Goal: Understanding of CV disease, CV risk reduction, and recovery process will improve Outcome: Progressing Goal: Individualized Educational Video(s) Outcome: Progressing   Problem: Activity: Goal: Ability to return to baseline activity level will improve Outcome: Progressing   Problem: Cardiovascular: Goal: Ability to achieve and maintain adequate cardiovascular perfusion will improve Outcome: Progressing Goal: Vascular access site(s) Level  0-1 will be maintained Outcome: Progressing   Problem: Health Behavior/Discharge Planning: Goal: Ability to safely manage health-related needs after discharge will improve Outcome: Progressing   Problem: Education: Goal: Ability to describe self-care measures that may prevent or decrease complications (Diabetes Survival Skills Education) will improve Outcome: Progressing Goal: Individualized Educational Video(s) Outcome: Progressing   Problem: Coping: Goal: Ability to adjust to condition or change in health will improve Outcome: Progressing   Problem: Fluid Volume: Goal: Ability to maintain a balanced intake and output will improve Outcome: Progressing   Problem: Health Behavior/Discharge Planning: Goal: Ability to identify and utilize available resources and services will improve Outcome: Progressing Goal: Ability to manage health-related needs will improve Outcome: Progressing   Problem: Metabolic: Goal: Ability to maintain appropriate glucose levels will improve Outcome: Progressing   Problem: Nutritional: Goal: Maintenance of adequate nutrition will improve Outcome: Progressing Goal: Progress toward achieving an optimal weight will improve Outcome: Progressing   Problem: Skin Integrity: Goal: Risk for impaired skin integrity will decrease Outcome: Progressing   Problem: Tissue Perfusion: Goal: Adequacy of tissue perfusion will improve Outcome: Progressing

## 2023-05-09 NOTE — Progress Notes (Signed)
Replacing k 3.2 with PO x1 and IV  per Dr. Ciro Backer, PharmD, BCIDP, AAHIVP, CPP Infectious Disease Pharmacist 05/09/2023 6:42 PM

## 2023-05-10 ENCOUNTER — Ambulatory Visit: Payer: BC Managed Care – PPO | Admitting: Family Medicine

## 2023-05-10 DIAGNOSIS — I2102 ST elevation (STEMI) myocardial infarction involving left anterior descending coronary artery: Secondary | ICD-10-CM | POA: Diagnosis not present

## 2023-05-10 DIAGNOSIS — R570 Cardiogenic shock: Secondary | ICD-10-CM | POA: Diagnosis not present

## 2023-05-10 LAB — BASIC METABOLIC PANEL
Anion gap: 12 (ref 5–15)
Anion gap: 13 (ref 5–15)
Anion gap: 15 (ref 5–15)
BUN: 23 mg/dL — ABNORMAL HIGH (ref 6–20)
BUN: 24 mg/dL — ABNORMAL HIGH (ref 6–20)
BUN: 23 mg/dL — ABNORMAL HIGH (ref 6–20)
CO2: 26 mmol/L (ref 22–32)
CO2: 23 mmol/L (ref 22–32)
CO2: 28 mmol/L (ref 22–32)
Calcium: 8.5 mg/dL — ABNORMAL LOW (ref 8.9–10.3)
Calcium: 8.5 mg/dL — ABNORMAL LOW (ref 8.9–10.3)
Calcium: 8.8 mg/dL — ABNORMAL LOW (ref 8.9–10.3)
Chloride: 90 mmol/L — ABNORMAL LOW (ref 98–111)
Chloride: 88 mmol/L — ABNORMAL LOW (ref 98–111)
Chloride: 87 mmol/L — ABNORMAL LOW (ref 98–111)
Creatinine, Ser: 1.72 mg/dL — ABNORMAL HIGH (ref 0.44–1.00)
Creatinine, Ser: 1.87 mg/dL — ABNORMAL HIGH (ref 0.44–1.00)
Creatinine, Ser: 1.76 mg/dL — ABNORMAL HIGH (ref 0.44–1.00)
GFR, Estimated: 34 mL/min — ABNORMAL LOW (ref >60)
GFR, Estimated: 31 mL/min — ABNORMAL LOW (ref >60)
GFR, Estimated: 33 mL/min — ABNORMAL LOW (ref >60)
Glucose, Bld: 279 mg/dL — ABNORMAL HIGH (ref 70–99)
Glucose, Bld: 394 mg/dL — ABNORMAL HIGH (ref 70–99)
Glucose, Bld: 221 mg/dL — ABNORMAL HIGH (ref 70–99)
Potassium: 3 mmol/L — ABNORMAL LOW (ref 3.5–5.1)
Potassium: 4 mmol/L (ref 3.5–5.1)
Potassium: 2.9 mmol/L — ABNORMAL LOW (ref 3.5–5.1)
Sodium: 128 mmol/L — ABNORMAL LOW (ref 135–145)
Sodium: 124 mmol/L — ABNORMAL LOW (ref 135–145)
Sodium: 130 mmol/L — ABNORMAL LOW (ref 135–145)

## 2023-05-10 LAB — CBC
HCT: 32.6 % — ABNORMAL LOW (ref 36.0–46.0)
Hemoglobin: 10.8 g/dL — ABNORMAL LOW (ref 12.0–15.0)
MCH: 29 pg (ref 26.0–34.0)
MCHC: 33.1 g/dL (ref 30.0–36.0)
MCV: 87.4 fL (ref 80.0–100.0)
Platelets: 242 10*3/uL (ref 150–400)
RBC: 3.73 MIL/uL — ABNORMAL LOW (ref 3.87–5.11)
RDW: 13.9 % (ref 11.5–15.5)
WBC: 12.1 10*3/uL — ABNORMAL HIGH (ref 4.0–10.5)
nRBC: 0.8 % — ABNORMAL HIGH (ref 0.0–0.2)

## 2023-05-10 LAB — HEPARIN LEVEL (UNFRACTIONATED)
Heparin Unfractionated: 0.27 IU/mL — ABNORMAL LOW (ref 0.30–0.70)
Heparin Unfractionated: 0.2 IU/mL — ABNORMAL LOW (ref 0.30–0.70)

## 2023-05-10 LAB — GLUCOSE, CAPILLARY
Glucose-Capillary: 274 mg/dL — ABNORMAL HIGH (ref 70–99)
Glucose-Capillary: 333 mg/dL — ABNORMAL HIGH (ref 70–99)
Glucose-Capillary: 322 mg/dL — ABNORMAL HIGH (ref 70–99)
Glucose-Capillary: 311 mg/dL — ABNORMAL HIGH (ref 70–99)
Glucose-Capillary: 218 mg/dL — ABNORMAL HIGH (ref 70–99)
Glucose-Capillary: 224 mg/dL — ABNORMAL HIGH (ref 70–99)
Glucose-Capillary: 206 mg/dL — ABNORMAL HIGH (ref 70–99)
Glucose-Capillary: 193 mg/dL — ABNORMAL HIGH (ref 70–99)
Glucose-Capillary: 78 mg/dL (ref 70–99)

## 2023-05-10 LAB — SURGICAL PCR SCREEN
MRSA, PCR: NEGATIVE
Staphylococcus aureus: POSITIVE — AB

## 2023-05-10 LAB — POCT I-STAT 7, (LYTES, BLD GAS, ICA,H+H)
Acid-Base Excess: 4 mmol/L — ABNORMAL HIGH (ref 0.0–2.0)
Bicarbonate: 25.4 mmol/L (ref 20.0–28.0)
Calcium, Ion: 1.06 mmol/L — ABNORMAL LOW (ref 1.15–1.40)
HCT: 33 % — ABNORMAL LOW (ref 36.0–46.0)
Hemoglobin: 11.2 g/dL — ABNORMAL LOW (ref 12.0–15.0)
O2 Saturation: 98 %
Patient temperature: 37.4
Potassium: 3.1 mmol/L — ABNORMAL LOW (ref 3.5–5.1)
Sodium: 126 mmol/L — ABNORMAL LOW (ref 135–145)
TCO2: 26 mmol/L (ref 22–32)
pCO2 arterial: 28.3 mmHg — ABNORMAL LOW (ref 32–48)
pH, Arterial: 7.562 — ABNORMAL HIGH (ref 7.35–7.45)
pO2, Arterial: 95 mmHg (ref 83–108)

## 2023-05-10 LAB — DIGOXIN LEVEL: Digoxin Level: 1 ng/mL (ref 0.8–2.0)

## 2023-05-10 LAB — MAGNESIUM: Magnesium: 2 mg/dL (ref 1.7–2.4)

## 2023-05-10 LAB — COOXEMETRY PANEL
Carboxyhemoglobin: 1.9 % — ABNORMAL HIGH (ref 0.5–1.5)
Methemoglobin: <0.7 % (ref 0.0–1.5)
O2 Saturation: 60.3 %
Total hemoglobin: 11.1 g/dL — ABNORMAL LOW (ref 12.0–16.0)

## 2023-05-10 LAB — SARS CORONAVIRUS 2 BY RT PCR: SARS Coronavirus 2 by RT PCR: NEGATIVE

## 2023-05-10 MED ORDER — ORAL CARE MOUTH RINSE: 15.0000 mL | OROMUCOSAL | Status: DC | PRN

## 2023-05-10 MED ORDER — DIGOXIN 125 MCG PO TABS
0.0625 mg | ORAL_TABLET | Freq: Every day | ORAL | Status: DC
  Administered 2023-05-10: 0.0625 mg via ORAL
  Filled 2023-05-10 (×2): qty 1

## 2023-05-10 MED ORDER — EPINEPHRINE HCL 5 MG/250ML IV SOLN IN NS
0.0000 ug/min | INTRAVENOUS | Status: DC
  Filled 2023-05-10: qty 250

## 2023-05-10 MED ORDER — POTASSIUM CHLORIDE CRYS ER 20 MEQ PO TBCR
40.0000 meq | EXTENDED_RELEASE_TABLET | Freq: Once | ORAL | Status: AC
  Administered 2023-05-10: 40 meq via ORAL
  Filled 2023-05-10: qty 2

## 2023-05-10 MED ORDER — CEFAZOLIN SODIUM-DEXTROSE 2-4 GM/100ML-% IV SOLN
2.0000 g | INTRAVENOUS | Status: AC
  Administered 2023-05-11: 2 g via INTRAVENOUS
  Filled 2023-05-10: qty 100

## 2023-05-10 MED ORDER — DEXMEDETOMIDINE HCL IN NACL 400 MCG/100ML IV SOLN
0.1000 ug/kg/h | INTRAVENOUS | Status: AC
  Administered 2023-05-11: .5 ug/kg/h via INTRAVENOUS
  Filled 2023-05-10 (×2): qty 100

## 2023-05-10 MED ORDER — POTASSIUM CHLORIDE CRYS ER 20 MEQ PO TBCR
20.0000 meq | EXTENDED_RELEASE_TABLET | Freq: Once | ORAL | Status: AC
  Administered 2023-05-10: 20 meq via ORAL
  Filled 2023-05-10: qty 1

## 2023-05-10 MED ORDER — NITROGLYCERIN IN D5W 200-5 MCG/ML-% IV SOLN
2.0000 ug/min | INTRAVENOUS | Status: DC
  Filled 2023-05-10: qty 250

## 2023-05-10 MED ORDER — CHLORHEXIDINE GLUCONATE 4 % EX SOLN
CUTANEOUS | Status: AC
  Filled 2023-05-10: qty 60

## 2023-05-10 MED ORDER — CHLORHEXIDINE GLUCONATE CLOTH 2 % EX PADS
6.0000 | MEDICATED_PAD | Freq: Once | CUTANEOUS | Status: AC
  Administered 2023-05-10: 6 via TOPICAL

## 2023-05-10 MED ORDER — INSULIN REGULAR(HUMAN) IN NACL 100-0.9 UT/100ML-% IV SOLN
INTRAVENOUS | Status: DC
  Filled 2023-05-10: qty 100

## 2023-05-10 MED ORDER — POTASSIUM CHLORIDE 10 MEQ/50ML IV SOLN
10.0000 meq | INTRAVENOUS | Status: AC
  Administered 2023-05-10 – 2023-05-11 (×4): 10 meq via INTRAVENOUS
  Filled 2023-05-10 (×4): qty 50

## 2023-05-10 MED ORDER — NOREPINEPHRINE 4 MG/250ML-% IV SOLN
0.0000 ug/min | INTRAVENOUS | Status: DC
  Filled 2023-05-10: qty 250

## 2023-05-10 MED ORDER — CHLORHEXIDINE GLUCONATE CLOTH 2 % EX PADS
6.0000 | MEDICATED_PAD | Freq: Once | CUTANEOUS | Status: AC
  Administered 2023-05-11: 6 via TOPICAL

## 2023-05-10 MED ORDER — MANNITOL 20 % IV SOLN
INTRAVENOUS | Status: DC
  Filled 2023-05-10: qty 13

## 2023-05-10 MED ORDER — POTASSIUM CHLORIDE 2 MEQ/ML IV SOLN
80.0000 meq | INTRAVENOUS | Status: DC
  Filled 2023-05-10: qty 40

## 2023-05-10 MED ORDER — PHENYLEPHRINE HCL-NACL 20-0.9 MG/250ML-% IV SOLN
30.0000 ug/min | INTRAVENOUS | Status: AC
  Administered 2023-05-11: 25 ug/min via INTRAVENOUS
  Filled 2023-05-10: qty 250

## 2023-05-10 MED ORDER — POTASSIUM CHLORIDE ER 10 MEQ PO TBCR
40.0000 meq | EXTENDED_RELEASE_TABLET | Freq: Once | ORAL | Status: AC
  Administered 2023-05-10: 40 meq via ORAL
  Filled 2023-05-10 (×3): qty 4

## 2023-05-10 MED ORDER — TRANEXAMIC ACID 1000 MG/10ML IV SOLN
1.5000 mg/kg/h | INTRAVENOUS | Status: AC
  Administered 2023-05-11: 1.5 mg/kg/h via INTRAVENOUS
  Filled 2023-05-10: qty 25

## 2023-05-10 MED ORDER — PLASMA-LYTE A IV SOLN
INTRAVENOUS | Status: DC
  Filled 2023-05-10: qty 2.5

## 2023-05-10 MED ORDER — CHLORHEXIDINE GLUCONATE 0.12 % MT SOLN
15.0000 mL | Freq: Once | OROMUCOSAL | Status: AC
  Administered 2023-05-11: 15 mL via OROMUCOSAL

## 2023-05-10 MED ORDER — TEMAZEPAM 15 MG PO CAPS
15.0000 mg | ORAL_CAPSULE | Freq: Once | ORAL | Status: AC | PRN
  Administered 2023-05-10: 15 mg via ORAL
  Filled 2023-05-10: qty 1

## 2023-05-10 MED ORDER — MILRINONE LACTATE IN DEXTROSE 20-5 MG/100ML-% IV SOLN
0.3000 ug/kg/min | INTRAVENOUS | Status: DC
  Filled 2023-05-10 (×2): qty 100

## 2023-05-10 MED ORDER — DEXTROSE 50 % IV SOLN: 0.0000 mL | INTRAVENOUS | Status: DC | PRN

## 2023-05-10 MED ORDER — BISACODYL 5 MG PO TBEC
5.0000 mg | DELAYED_RELEASE_TABLET | Freq: Once | ORAL | Status: DC
  Filled 2023-05-10: qty 1

## 2023-05-10 MED ORDER — VANCOMYCIN HCL 1500 MG/300ML IV SOLN
1500.0000 mg | INTRAVENOUS | Status: AC
  Administered 2023-05-11: 1500 mg via INTRAVENOUS
  Filled 2023-05-10: qty 300

## 2023-05-10 MED ORDER — MUPIROCIN 2 % EX OINT
1.0000 | TOPICAL_OINTMENT | Freq: Two times a day (BID) | CUTANEOUS | Status: DC
  Administered 2023-05-10: 1 via NASAL
  Filled 2023-05-10: qty 22

## 2023-05-10 MED ORDER — POTASSIUM CHLORIDE 10 MEQ/50ML IV SOLN
10.0000 meq | INTRAVENOUS | Status: AC
  Administered 2023-05-10 (×4): 10 meq via INTRAVENOUS
  Filled 2023-05-10 (×4): qty 50

## 2023-05-10 MED ORDER — TRANEXAMIC ACID (OHS) BOLUS VIA INFUSION
15.0000 mg/kg | INTRAVENOUS | Status: AC
  Administered 2023-05-11: 1503 mg via INTRAVENOUS
  Filled 2023-05-10: qty 1503

## 2023-05-10 MED ORDER — TRANEXAMIC ACID (OHS) PUMP PRIME SOLUTION
2.0000 mg/kg | INTRAVENOUS | Status: DC
  Filled 2023-05-10: qty 2

## 2023-05-10 MED ORDER — METOPROLOL TARTRATE 12.5 MG HALF TABLET
12.5000 mg | ORAL_TABLET | Freq: Once | ORAL | Status: AC
  Administered 2023-05-11: 12.5 mg via ORAL
  Filled 2023-05-10: qty 1

## 2023-05-10 MED ORDER — INSULIN REGULAR(HUMAN) IN NACL 100-0.9 UT/100ML-% IV SOLN
INTRAVENOUS | Status: DC
  Administered 2023-05-10: 11.5 [IU]/h via INTRAVENOUS
  Filled 2023-05-10 (×2): qty 100

## 2023-05-10 MED ORDER — HEPARIN 30,000 UNITS/1000 ML (OHS) CELLSAVER SOLUTION
Status: DC
  Filled 2023-05-10: qty 1000

## 2023-05-10 NOTE — Progress Notes (Addendum)
Patient ID: [name], female   DOB: 1965/12/26, 57 y.o.   MRN: [id number]    Advanced Heart Failure Rounding Note   Subjective:    7/14: RHC mean RA 21, PA 50/25 mean 35, mean PCWP 28, CI 1.8 F/2.6 T, QpQs 3.3.  IABP placed.   Remains on IABP at 1:1 + Milrinone 0.375. Co-ox 60%.   Continues to diurese w/ IV Lasix. 5.6L in UOP yesterday. Overall net negative 2.3L for the day. CVP 8   Scr stable 1.69 -> 1.64 -> 1.56->1.70->1.72  K 3.0 C/w hyperglycemia but improving. SSI ordered. Corrected sodium 132   Swan: CVP 8 PA (poor waveform) CI 2.7 Co-ox 60%  Feels ok this morning. No complaints. Denies dyspnea. Going to OR tomorrow    Objective:   Weight Range:  Vital Signs:   Temp:  [97.3 F (36.3 C)-99.3 F (37.4 C)] 97.9 F (36.6 C) (07/18 0715) Pulse Rate:  [84-259] 84 (07/18 0715) Resp:  [10-27] 17 (07/18 0715) BP: (62-169)/(34-129) 144/129 (07/18 0700) SpO2:  [79 %-100 %] 96 % (07/18 0715) Weight:  [100.2 kg] 100.2 kg (07/18 0500) Last BM Date : 05/09/23  Weight change: Filed Weights   05/07/23 0500 05/09/23 0500 05/10/23 0500  Weight: 105 kg 99.9 kg 100.2 kg    Intake/Output:   Intake/Output Summary (Last 24 hours) at 05/10/2023 0749 Last data filed at 05/10/2023 0700 Gross per 24 hour  Intake 3310.13 ml  Output 5620 ml  Net -2309.87 ml     Physical Exam: CVP 8  General:  well appearing, obese, laying in bed. No distress  HEENT: normal Neck: supple. JVD 7 cm. Carotids 2+ bilat; no bruits. No lymphadenopathy or thyromegaly appreciated. Cor: PMI nondisplaced. Regular rate & rhythm.  Harsh systolic and diastolic murmur  Lungs: CTAB  Abdomen: soft, nontender, nondistended. No hepatosplenomegaly. No bruits or masses. Good bowel sounds. Extremities: no cyanosis, clubbing, rash, trace b/l LE edema, b/l TMT amputation. Rt fem IABP site stable. No bleeding   Neuro: alert & oriented x 3, cranial nerves grossly intact. moves all 4 extremities w/o difficulty.  Affect pleasant.   Telemetry: SR 90s Personally reviewed  Labs: Basic Metabolic Panel: Recent Labs  Lab 05/05/23 0251 05/06/23 0255 05/06/23 1920 05/08/23 0529 05/08/23 1454 05/09/23 0452 05/09/23 1602 05/10/23 0357  NA 133* 129*   < > 127* 128* 132* 129* 128*  K 3.9 3.3*   < > 2.8* 3.1* 3.3* 3.2* 3.0*  CL 103 99   < > 91* 91* 90* 91* 90*  CO2 19* 20*   < > 23 22 25 25 26   GLUCOSE 252* 277*   < > 361* 325* 339* 357* 279*  BUN 19 22*   < > 22* 24* 23* 23* 23*  CREATININE 1.35* 1.45*   < > 1.56* 1.62* 1.70* 1.65* 1.72*  CALCIUM 8.7* 8.0*   < > 7.6* 7.8* 8.6* 8.4* 8.5*  MG 1.8 2.3  --   --   --   --   --   --    < > = values in this interval not displayed.    Liver Function Tests: Recent Labs  Lab 05/04/23 0612  AST 31  ALT 21  ALKPHOS 137*  BILITOT 0.4  PROT 6.3*  ALBUMIN 3.5   No results for input(s): "LIPASE", "AMYLASE" in the last 168 hours. No results for input(s): "AMMONIA" in the last 168 hours.  CBC: Recent Labs  Lab 05/04/23 0612 05/05/23 0251 05/06/23 0255 05/06/23 2140 05/06/23 2144 05/07/23  8469 05/08/23 0529 05/09/23 0452 05/10/23 0357  WBC 16.1*   < > 14.3*  --   --  11.6* 10.6* 10.9* 12.1*  NEUTROABS 8.4*  --   --   --   --   --   --   --   --   HGB 13.1   < > 10.1*   < > 9.5* 9.6* 9.7* 10.3* 10.8*  HCT 41.2   < > 32.3*   < > 28.0* 29.9* 29.9* 31.6* 32.6*  MCV 90.7   < > 90.0  --   --  90.1 87.9 85.9 87.4  PLT 292   < > 239  --   --  226 220 223 242   < > = values in this interval not displayed.    Cardiac Enzymes: No results for input(s): "CKTOTAL", "CKMB", "CKMBINDEX", "TROPONINI" in the last 168 hours.  BNP: BNP (last 3 results) Recent Labs    05/04/23 0620  BNP 123.2*    ProBNP (last 3 results) No results for input(s): "PROBNP" in the last 8760 hours.    Other results:  Imaging: DG CHEST PORT 1 VIEW  Result Date: 05/08/2023 CLINICAL DATA:  CHF EXAM: PORTABLE CHEST 1 VIEW COMPARISON:  Previous studies including the  examination done on 05/07/2023 FINDINGS: Transverse diameter of heart is increased. Central pulmonary vessels are less prominent. There is small linear metallic density overlying the proximal descending thoracic aorta, possibly marker in intra-aortic balloon pump. There are no signs of alveolar pulmonary edema or focal pulmonary consolidation. There is poor inspiration. There is no pleural effusion or pneumothorax. Tip of right IJ Swan-Ganz catheter is noted in the region of right lower lobe pulmonary artery. Tip of left PICC line is seen in the course of the superior vena cava. IMPRESSION: Cardiomegaly. There is interval decrease in pulmonary vascular congestion and pulmonary edema. There are no new focal infiltrates. Support devices as described. Electronically Signed   By: Ernie Avena M.D.   On: 05/08/2023 12:49     Medications:     Scheduled Medications:  amitriptyline  50 mg Oral QHS   aspirin EC  81 mg Oral Daily   Chlorhexidine Gluconate Cloth  6 each Topical Daily   digoxin  0.0625 mg Oral Daily   DULoxetine  60 mg Oral BID   furosemide  80 mg Intravenous BID   gabapentin  600 mg Oral TID   insulin aspart  0-20 Units Subcutaneous TID WC   insulin aspart  0-5 Units Subcutaneous QHS   insulin aspart  5 Units Subcutaneous TID WC   insulin glargine-yfgn  20 Units Subcutaneous BID   levothyroxine  25 mcg Oral Daily   linaclotide  145 mcg Oral QAC breakfast   magnesium oxide  400 mg Oral Daily   melatonin  10 mg Oral QHS   pantoprazole  40 mg Oral BID AC   rosuvastatin  40 mg Oral Daily   senna  2 tablet Oral BID   sodium chloride flush  10-40 mL Intracatheter Q12H   sodium chloride flush  3 mL Intravenous Q12H   sodium chloride flush  3 mL Intravenous Q12H   tapentadol  50 mg Oral TID   topiramate  100 mg Oral Daily    Infusions:  sodium chloride Stopped (05/10/23 0617)   sodium chloride Stopped (05/04/23 1630)   sodium chloride     sodium chloride     amiodarone 30  mg/hr (05/10/23 0700)   cangrelor (KENGREAL) 50 mg in sodium  chloride 0.9 % 250 mL (0.2 mg/mL) infusion 0.75 mcg/kg/min (05/10/23 0700)   heparin 1,150 Units/hr (05/10/23 0700)   milrinone 0.375 mcg/kg/min (05/10/23 0737)   norepinephrine (LEVOPHED) Adult infusion Stopped (05/07/23 0935)   potassium chloride 10 mEq (05/10/23 0723)   vasopressin      PRN Medications: sodium chloride, sodium chloride, acetaminophen, albuterol, HYDROcodone-acetaminophen, lidocaine, methocarbamol, morphine injection, ondansetron (ZOFRAN) IV, sodium chloride flush, sodium chloride flush, sodium chloride flush    Patient Profile    [name] is a 57 y.o. 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 Admitted with STEMI s/p PDI to LAD now in cardiogenic shock.    Assessment/Plan:   Cardiogenic shock -> acute HF with mildly-reduced EF complicated by apical VSD.  - Echo 6/23 EF 55%, no RWMA, normal RV, trivial MR - Echo 7/12,  EF 45% - Echo 7/14: EF 60% with apical VSD, normal RV. Apical VSD creates large shunt by RHC QpQs 3.3.  - IABP 1:1 on heparin gtt with milrinone 0.375.  CI 3.8 this morning. Co-ox 58%  - Heparin gtt for IABP.  - CVP 8 today with excellent diuresis yesterday. SCr stable   - Continue IV Lasix 80 mg and Replace K   - Continue digoxin 0.125. Dig level 1.0. reduce dig level to 0.0625  - TEE 7/15 showed EF 55% with apical akinesis and apical VSD with significant L>R flow, RV mildly dilated and dysfunctional.  VSD will need definitive treatment, either percutaneously via plug or open surgery.  Open surgery will be complicated by the need to stop ticagrelor (has new LAD stent) and ideally will need some time for tissues around VSD to fibrose. Have discussed with Drs Cliffton Asters and Lynnette Caffey, plan will be to aim for surgical repair Friday.  Holding Brilinta, covering w/ Cangrelor.    Anterior  STEMI, CAD - HsTrop >24K on admission - s/p PCI to LAD 7/12, post DES 0% residual stenosis. Medical management of 40% stenosed RCA.  - plan for DAPT with ASA and Ticagrelor x12 months (covering w/ Cangrelor for now) - Continue statin, LDL unable to be calculated with high TGD, continue Crestor 40.  - Complicated by infarct-related VSD.   3. Post-MI chest pain -possible pericarditis - No longer reporting pericardial-type pain.    DM II - A1c last 7.6 3/24, update - SSI while admitted per primary - has diabetic foot ulcer, WOC following - ABI 12/22 were normal   Obesity - Body mass index is 37.76 kg/m.  - consider GLP-1 as OP   OSA - does not use CPAP   HLD - Continue statin  7. AKI - Stable creatinine today at 1.7  8. Hyponatremia - Na 132, hypervolemic hyponatremia. Fluid restrict.     Length of Stay: 6   Brittainy Delmer Islam  05/10/2023, 7:49 AM  Advanced Heart Failure Team Pager [phone] (M-F; 7a - 4p)  Please contact [facility] for night-coverage after hours (4p -7a ) and weekends on [email or web address]  Patient seen with PA, agree with the above note.   I/Os net negative 2310, CVP 12 on my read.  CI 2.7, co-ox 60%.  Has some left shoulder pain, positional.  General: NAD Neck: JVP 10 cm, no thyromegaly or thyroid nodule.  Lungs: Clear to auscultation bilaterally with normal respiratory effort. CV: Nondisplaced PMI.  Heart regular S1/S2, no S3/S4, harsh systolic/diastolic murmur.  No peripheral edema.   Abdomen: Soft, nontender, no  hepatosplenomegaly, no distention.  Skin: Intact without lesions or rashes.  Neurologic: Alert and oriented x 3.  Psych: Normal affect. Extremities: No clubbing or cyanosis.  HEENT: Normal.   Keep current support with milrinone and IABP 1:1.  Platelets stable, on heparin gtt.    CVP 12 today, creatinine stable at 1.7.  Will give Lasix 80 mg IV bid today, no metolazone.  Replace K and pm BMET.    Have discussed with Drs Cliffton Asters  and Lynnette Caffey, plan will be to aim for surgical VSD repair Friday.  Brilinta has been stopped and she is on Cangrelor for fresh stent. Will need to restart P2Y12 inhibition as soon as possible post-op.   CRITICAL CARE Performed by: Marca Ancona  Total critical care time: 35 minutes  Critical care time was exclusive of separately billable procedures and treating other patients.  Critical care was necessary to treat or prevent imminent or life-threatening deterioration.  Critical care was time spent personally by me on the following activities: development of treatment plan with patient and/or surrogate as well as nursing, discussions with consultants, evaluation of patient's response to treatment, examination of patient, obtaining history from patient or surrogate, ordering and performing treatments and interventions, ordering and review of laboratory studies, ordering and review of radiographic studies, pulse oximetry and re-evaluation of patient's condition.  Marca Ancona 05/10/2023 9:48 AM

## 2023-05-10 NOTE — Progress Notes (Addendum)
ANTICOAGULATION CONSULT NOTE-Follow Up  Pharmacy Consult for heparin  Indication: IABP  Allergies  Allergen Reactions   Ibuprofen Other (See Comments)    Pt is unable to take this due to kidney problems.      Penicillins Rash and Other (See Comments)    Has patient had a PCN reaction causing immediate rash, facial/tongue/throat swelling, SOB or lightheadedness with hypotension: No Has patient had a PCN reaction causing severe rash involving mucus membranes or skin necrosis: No Has patient had a PCN reaction that required hospitalization No Has patient had a PCN reaction occurring within the last 10 years: No If all of the above answers are "NO", then may proceed with Cephalosporin use.    Patient Measurements: Height: 5\' 4"  (162.6 cm) Weight: 100.2 kg (220 lb 14.4 oz) IBW/kg (Calculated) : 54.7 HEPARIN DW (KG): 77.8    Vital Signs: Temp: 97.7 F (36.5 C) (07/18 0500) Temp Source: Core (07/18 0000) BP: 100/51 (07/18 0400) Pulse Rate: 191 (07/18 0500)  Labs: Recent Labs    05/07/23 1413 05/07/23 1413 05/08/23 0529 05/08/23 1454 05/09/23 0452 05/09/23 1602 05/10/23 0357  HGB  --    < > 9.7*  --  10.3*  --  10.8*  HCT  --   --  29.9*  --  31.6*  --  32.6*  PLT  --   --  220  --  223  --  242  APTT 44*  --   --   --   --   --   --   HEPARINUNFRC 0.21*  --  <0.10* 0.12* 0.16*  --  0.27*  CREATININE  --    < > 1.56* 1.62* 1.70* 1.65* 1.72*   < > = values in this interval not displayed.    Estimated Creatinine Clearance: 41.5 mL/min (A) (by C-G formula based on SCr of 1.72 mg/dL (H)).   Medical History: Past Medical History:  Diagnosis Date   Chronic cough    Chronic pain    Diabetes mellitus without complication (HCC)    Dyspnea    GERD (gastroesophageal reflux disease)    Hypokalemia    Hypothyroidism    Left wrist pain 06/01/2021   Myonecrosis (HCC) 07/01/2021   Neurocardiogenic syncope    OSA (obstructive sleep apnea)    does not use CPAP   Osteomyelitis  (HCC)    bilateral feet   Peripheral neuropathy    Presence of intrathecal pump    recieves Prialt/bupivicaine   Tear of gluteus medius tendon 07/01/2021   Wegener's granulomatosis 2009   Wegner's disease (congenital syphilitic osteochondritis)     Medications:  Medications Prior to Admission  Medication Sig Dispense Refill Last Dose   acetaminophen (TYLENOL) 325 MG tablet Take 2 tablets (650 mg total) by mouth every 6 (six) hours as needed for mild pain (or Fever >/= 101).   Past Week   albuterol (VENTOLIN HFA) 108 (90 Base) MCG/ACT inhaler Inhale 2 puffs into the lungs every 6 (six) hours as needed for wheezing or shortness of breath. 1 each 11 unknown   amitriptyline (ELAVIL) 50 MG tablet TAKE 1 TABLET BY MOUTH AT BEDTIME FOR SLEEP. (Patient taking differently: Take 50 mg by mouth at bedtime. TAKE 1 TABLET BY MOUTH AT BEDTIME FOR SLEEP.) 30 tablet 0 05/03/2023   aspirin EC 81 MG tablet Take 162 mg by mouth daily. Swallow whole.   05/04/2023   Calcium Carb-Cholecalciferol (CALCIUM CARBONATE-VITAMIN D3 PO) Take 1 tablet by mouth daily.   05/03/2023  cyanocobalamin (VITAMIN B12) 1000 MCG tablet Take 1,000 mcg by mouth daily.   05/03/2023   DULoxetine (CYMBALTA) 60 MG capsule Take 60 mg by mouth 2 (two) times daily.   05/03/2023   levothyroxine (SYNTHROID) 25 MCG tablet Take 1 tablet (25 mcg total) by mouth daily. 30 tablet 0 05/03/2023   LINZESS 145 MCG CAPS capsule Take one capsule (145 mcg dose) by mouth daily. 30 capsule 0 Past Month   magnesium oxide (MAG-OX) 400 MG tablet Take 400 mg by mouth daily.   Past Month   metFORMIN (GLUCOPHAGE) 500 MG tablet Take 1 tablet (500 mg total) by mouth 2 (two) times daily with a meal. 180 tablet 3 05/03/2023   methocarbamol (ROBAXIN) 500 MG tablet Take 1 tablet (500 mg total) by mouth every 6 (six) hours as needed for muscle spasms. 30 tablet 0 Past Week   naloxone (NARCAN) nasal spray 4 mg/0.1 mL Place 0.4 mg into the nose once.   unknown   NUCYNTA 50 MG  tablet Take 50 mg by mouth 3 (three) times daily.   05/03/2023   pantoprazole (PROTONIX) 40 MG tablet TAKE 1 TABLET BY MOUTH TWICE DAILY BEFORE A MEAL 30 tablet 0 05/03/2023   rosuvastatin (CRESTOR) 10 MG tablet TAKE 1 TABLET BY MOUTH AT BEDTIME 30 tablet 0 05/03/2023   topiramate (TOPAMAX) 100 MG tablet TAKE (1) TABLET BY MOUTH ONCE DAILY. 30 tablet 0 05/03/2023   Vitamin D, Ergocalciferol, (DRISDOL) 1.25 MG (50000 UNIT) CAPS capsule Take 1 capsule (50,000 Units total) by mouth every Friday. 7 capsule 3 Past Week   gabapentin (NEURONTIN) 300 MG capsule Take 1 capsule (300 mg total) by mouth at bedtime. (Patient taking differently: Take 600 mg by mouth 3 (three) times daily. Patient is taking 2 in the am, 2 in the afternoon, and 2 at bedtime.) 90 capsule 3    lidocaine (LIDODERM) 5 % Place 1 patch onto the skin daily as needed (for pain). Remove & Discard patch within 12 hours or as directed by MD      Scheduled:   amitriptyline  50 mg Oral QHS   aspirin EC  81 mg Oral Daily   Chlorhexidine Gluconate Cloth  6 each Topical Daily   digoxin  0.125 mg Oral Daily   DULoxetine  60 mg Oral BID   furosemide  80 mg Intravenous BID   gabapentin  600 mg Oral TID   insulin aspart  0-20 Units Subcutaneous TID WC   insulin aspart  0-5 Units Subcutaneous QHS   insulin aspart  5 Units Subcutaneous TID WC   insulin glargine-yfgn  20 Units Subcutaneous BID   levothyroxine  25 mcg Oral Daily   linaclotide  145 mcg Oral QAC breakfast   magnesium oxide  400 mg Oral Daily   melatonin  10 mg Oral QHS   pantoprazole  40 mg Oral BID AC   rosuvastatin  40 mg Oral Daily   senna  2 tablet Oral BID   sodium chloride flush  10-40 mL Intracatheter Q12H   sodium chloride flush  3 mL Intravenous Q12H   sodium chloride flush  3 mL Intravenous Q12H   tapentadol  50 mg Oral TID   topiramate  100 mg Oral Daily    Assessment: 57 yo female with VSD and dropping co-ox. She is noted s/p cath with IABP.   Heparin level 0.27  this morning/therapeutic - no bleeding issues, cangrelor running as well, CBC stable.  Goal of Therapy:  Heparin level 0.2-0.5  units/ml Monitor platelets by anticoagulation protocol: Yes   Plan:  -Continue heparin at 1150 units/h -Daily heparin level and CBC  Arabella Merles, PharmD. Clinical Pharmacist 05/10/2023 5:30 AM

## 2023-05-10 NOTE — Progress Notes (Signed)
ANTICOAGULATION CONSULT NOTE-Follow Up  Pharmacy Consult for heparin  Indication: IABP  Allergies  Allergen Reactions   Ibuprofen Other (See Comments)    Pt is unable to take this due to kidney problems.      Penicillins Rash and Other (See Comments)    Has patient had a PCN reaction causing immediate rash, facial/tongue/throat swelling, SOB or lightheadedness with hypotension: No Has patient had a PCN reaction causing severe rash involving mucus membranes or skin necrosis: No Has patient had a PCN reaction that required hospitalization No Has patient had a PCN reaction occurring within the last 10 years: No If all of the above answers are "NO", then may proceed with Cephalosporin use.    Patient Measurements: Height: 5\' 4"  (162.6 cm) Weight: 100.2 kg (220 lb 14.4 oz) IBW/kg (Calculated) : 54.7 HEPARIN DW (KG): 77.8    Vital Signs: Temp: 98.4 F (36.9 C) (07/18 1300) Temp Source: Core (07/18 1200) BP: 107/62 (07/18 1200) Pulse Rate: 86 (07/18 1300)  Labs: Recent Labs    05/07/23 1413 05/07/23 1413 05/08/23 0529 05/08/23 1454 05/09/23 0452 05/09/23 1602 05/10/23 0357 05/10/23 1158  HGB  --    < > 9.7*  --  10.3*  --  10.8*  --   HCT  --   --  29.9*  --  31.6*  --  32.6*  --   PLT  --   --  220  --  223  --  242  --   APTT 44*  --   --   --   --   --   --   --   HEPARINUNFRC 0.21*  --  <0.10*   < > 0.16*  --  0.27* 0.20*  CREATININE  --   --  1.56*   < > 1.70* 1.65* 1.72* 1.87*   < > = values in this interval not displayed.    Estimated Creatinine Clearance: 38.2 mL/min (A) (by C-G formula based on SCr of 1.87 mg/dL (H)).   Medical History: Past Medical History:  Diagnosis Date   Chronic cough    Chronic pain    Diabetes mellitus without complication (HCC)    Dyspnea    GERD (gastroesophageal reflux disease)    Hypokalemia    Hypothyroidism    Left wrist pain 06/01/2021   Myonecrosis (HCC) 07/01/2021   Neurocardiogenic syncope    OSA (obstructive sleep  apnea)    does not use CPAP   Osteomyelitis (HCC)    bilateral feet   Peripheral neuropathy    Presence of intrathecal pump    recieves Prialt/bupivicaine   Tear of gluteus medius tendon 07/01/2021   Wegener's granulomatosis 2009   Wegner's disease (congenital syphilitic osteochondritis)     Medications:   sodium chloride Stopped (05/10/23 0617)   sodium chloride Stopped (05/04/23 1630)   sodium chloride     sodium chloride     amiodarone 30 mg/hr (05/10/23 1300)   cangrelor (KENGREAL) 50 mg in sodium chloride 0.9 % 250 mL (0.2 mg/mL) infusion 0.75 mcg/kg/min (05/10/23 1300)   [START ON 05/11/2023]  ceFAZolin (ANCEF) IV     [START ON 05/11/2023]  ceFAZolin (ANCEF) IV     [START ON 05/11/2023] dexmedetomidine     [START ON 05/11/2023] heparin 30,000 units/NS 1000 mL solution for CELLSAVER     heparin 1,150 Units/hr (05/10/23 1300)   milrinone 0.375 mcg/kg/min (05/10/23 1300)   [START ON 05/11/2023] milrinone     [START ON 05/11/2023] nitroGLYCERIN  norepinephrine (LEVOPHED) Adult infusion Stopped (05/07/23 0935)   [START ON 05/11/2023] norepinephrine     [START ON 05/11/2023] tranexamic acid (CYKLOKAPRON) 2,500 mg in sodium chloride 0.9 % 250 mL (10 mg/mL) infusion     [START ON 05/11/2023] vancomycin     vasopressin       Assessment: 57 yo female with VSD and dropping co-ox. She is noted s/p cath with IABP.   Heparin level 0.2 this afternoon/therapeutic - no bleeding issues, cangrelor running as well, CBC stable.  Goal of Therapy:  Heparin level 0.2-0.5 units/ml Monitor platelets by anticoagulation protocol: Yes   Plan:  -Continue heparin at 1150 units/h -Daily heparin level and CBC  Reece Leader, Loura Back, BCPS, BCCP Clinical Pharmacist  05/10/2023 1:28 PM   [facility] pharmacy phone numbers are listed on [email or web address]

## 2023-05-11 ENCOUNTER — Inpatient Hospital Stay (HOSPITAL_COMMUNITY): Payer: BC Managed Care – PPO | Admitting: Anesthesiology

## 2023-05-11 ENCOUNTER — Other Ambulatory Visit: Payer: Self-pay

## 2023-05-11 ENCOUNTER — Encounter (HOSPITAL_COMMUNITY): Payer: Self-pay | Admitting: Cardiovascular Disease

## 2023-05-11 ENCOUNTER — Inpatient Hospital Stay (HOSPITAL_COMMUNITY): Payer: BC Managed Care – PPO

## 2023-05-11 ENCOUNTER — Encounter (HOSPITAL_COMMUNITY)
Admission: EM | Disposition: E | Payer: Self-pay | Source: Home / Self Care | Attending: Thoracic Surgery (Cardiothoracic Vascular Surgery)

## 2023-05-11 DIAGNOSIS — R570 Cardiogenic shock: Secondary | ICD-10-CM | POA: Diagnosis not present

## 2023-05-11 DIAGNOSIS — I2102 ST elevation (STEMI) myocardial infarction involving left anterior descending coronary artery: Secondary | ICD-10-CM | POA: Diagnosis not present

## 2023-05-11 DIAGNOSIS — Z1152 Encounter for screening for COVID-19: Secondary | ICD-10-CM | POA: Diagnosis not present

## 2023-05-11 DIAGNOSIS — J9589 Other postprocedural complications and disorders of respiratory system, not elsewhere classified: Secondary | ICD-10-CM

## 2023-05-11 DIAGNOSIS — I2109 ST elevation (STEMI) myocardial infarction involving other coronary artery of anterior wall: Secondary | ICD-10-CM | POA: Diagnosis not present

## 2023-05-11 DIAGNOSIS — I5023 Acute on chronic systolic (congestive) heart failure: Secondary | ICD-10-CM | POA: Diagnosis not present

## 2023-05-11 DIAGNOSIS — I232 Ventricular septal defect as current complication following acute myocardial infarction: Secondary | ICD-10-CM | POA: Diagnosis present

## 2023-05-11 DIAGNOSIS — J9601 Acute respiratory failure with hypoxia: Secondary | ICD-10-CM | POA: Diagnosis not present

## 2023-05-11 HISTORY — PX: VSD REPAIR: SHX276

## 2023-05-11 HISTORY — PX: TEE WITHOUT CARDIOVERSION: SHX5443

## 2023-05-11 LAB — POCT I-STAT, CHEM 8
BUN: 23 mg/dL — ABNORMAL HIGH (ref 6–20)
Calcium, Ion: 1.11 mmol/L — ABNORMAL LOW (ref 1.15–1.40)
Chloride: 89 mmol/L — ABNORMAL LOW (ref 98–111)
Creatinine, Ser: 1.9 mg/dL — ABNORMAL HIGH (ref 0.44–1.00)
Glucose, Bld: 214 mg/dL — ABNORMAL HIGH (ref 70–99)
HCT: 32 % — ABNORMAL LOW (ref 36.0–46.0)
Hemoglobin: 10.9 g/dL — ABNORMAL LOW (ref 12.0–15.0)
Potassium: 3.9 mmol/L (ref 3.5–5.1)
Sodium: 127 mmol/L — ABNORMAL LOW (ref 135–145)
TCO2: 28 mmol/L (ref 22–32)

## 2023-05-11 LAB — COMPREHENSIVE METABOLIC PANEL
ALT: 15 U/L (ref 0–44)
AST: 51 U/L — ABNORMAL HIGH (ref 15–41)
Albumin: 2.8 g/dL — ABNORMAL LOW (ref 3.5–5.0)
Alkaline Phosphatase: 64 U/L (ref 38–126)
Anion gap: 12 (ref 5–15)
BUN: 20 mg/dL (ref 6–20)
CO2: 20 mmol/L — ABNORMAL LOW (ref 22–32)
Calcium: 7.2 mg/dL — ABNORMAL LOW (ref 8.9–10.3)
Chloride: 96 mmol/L — ABNORMAL LOW (ref 98–111)
Creatinine, Ser: 1.79 mg/dL — ABNORMAL HIGH (ref 0.44–1.00)
GFR, Estimated: 33 mL/min — ABNORMAL LOW (ref >60)
Glucose, Bld: 265 mg/dL — ABNORMAL HIGH (ref 70–99)
Potassium: 5.1 mmol/L (ref 3.5–5.1)
Sodium: 128 mmol/L — ABNORMAL LOW (ref 135–145)
Total Bilirubin: 0.5 mg/dL (ref 0.3–1.2)
Total Protein: 4.3 g/dL — ABNORMAL LOW (ref 6.5–8.1)

## 2023-05-11 LAB — PREPARE RBC (CROSSMATCH)

## 2023-05-11 LAB — MAGNESIUM: Magnesium: 3.9 mg/dL — ABNORMAL HIGH (ref 1.7–2.4)

## 2023-05-11 LAB — COOXEMETRY PANEL
Carboxyhemoglobin: 2.1 % — ABNORMAL HIGH (ref 0.5–1.5)
Carboxyhemoglobin: 2 % — ABNORMAL HIGH (ref 0.5–1.5)
Methemoglobin: <0.7 % (ref 0.0–1.5)
Methemoglobin: 1.4 % (ref 0.0–1.5)
O2 Saturation: 73 %
O2 Saturation: 60.9 %
Total hemoglobin: 10.9 g/dL — ABNORMAL LOW (ref 12.0–16.0)
Total hemoglobin: 8.9 g/dL — ABNORMAL LOW (ref 12.0–16.0)

## 2023-05-11 LAB — TYPE AND SCREEN
Antibody Screen: NEGATIVE
Unit division: 0
Unit division: 0
Unit division: 0

## 2023-05-11 LAB — POCT I-STAT 7, (LYTES, BLD GAS, ICA,H+H)
Acid-base deficit: 6 mmol/L — ABNORMAL HIGH (ref 0.0–2.0)
Bicarbonate: 20.5 mmol/L (ref 20.0–28.0)
Calcium, Ion: 1.02 mmol/L — ABNORMAL LOW (ref 1.15–1.40)
HCT: 26 % — ABNORMAL LOW (ref 36.0–46.0)
Hemoglobin: 8.8 g/dL — ABNORMAL LOW (ref 12.0–15.0)
O2 Saturation: 93 %
Patient temperature: 37.4
Potassium: 4.4 mmol/L (ref 3.5–5.1)
Sodium: 132 mmol/L — ABNORMAL LOW (ref 135–145)
TCO2: 22 mmol/L (ref 22–32)
pCO2 arterial: 47 mmHg (ref 32–48)
pH, Arterial: 7.249 — ABNORMAL LOW (ref 7.35–7.45)
pO2, Arterial: 78 mmHg — ABNORMAL LOW (ref 83–108)

## 2023-05-11 LAB — CBC
HCT: 32.7 % — ABNORMAL LOW (ref 36.0–46.0)
HCT: 42.3 % (ref 36.0–46.0)
HCT: 34.1 % — ABNORMAL LOW (ref 36.0–46.0)
Hemoglobin: 10.8 g/dL — ABNORMAL LOW (ref 12.0–15.0)
Hemoglobin: 14.1 g/dL (ref 12.0–15.0)
Hemoglobin: 11.1 g/dL — ABNORMAL LOW (ref 12.0–15.0)
MCH: 28.8 pg (ref 26.0–34.0)
MCH: 28.8 pg (ref 26.0–34.0)
MCH: 29.2 pg (ref 26.0–34.0)
MCHC: 33 g/dL (ref 30.0–36.0)
MCHC: 33.3 g/dL (ref 30.0–36.0)
MCHC: 32.8 g/dL (ref 30.0–36.0)
MCV: 87.2 fL (ref 80.0–100.0)
MCV: 86.5 fL (ref 80.0–100.0)
MCV: 88.8 fL (ref 80.0–100.0)
Platelets: 251 10*3/uL (ref 150–400)
Platelets: 112 10*3/uL — ABNORMAL LOW (ref 150–400)
Platelets: 135 10*3/uL — ABNORMAL LOW (ref 150–400)
RBC: 3.75 MIL/uL — ABNORMAL LOW (ref 3.87–5.11)
RBC: 4.89 MIL/uL (ref 3.87–5.11)
RBC: 3.84 MIL/uL — ABNORMAL LOW (ref 3.87–5.11)
RDW: 14.2 % (ref 11.5–15.5)
RDW: 14.9 % (ref 11.5–15.5)
RDW: 14.8 % (ref 11.5–15.5)
WBC: 14.6 10*3/uL — ABNORMAL HIGH (ref 4.0–10.5)
WBC: 24.5 10*3/uL — ABNORMAL HIGH (ref 4.0–10.5)
WBC: 20.4 10*3/uL — ABNORMAL HIGH (ref 4.0–10.5)
nRBC: 0.6 % — ABNORMAL HIGH (ref 0.0–0.2)
nRBC: 0.2 % (ref 0.0–0.2)
nRBC: 0.5 % — ABNORMAL HIGH (ref 0.0–0.2)

## 2023-05-11 LAB — GLUCOSE, CAPILLARY
Glucose-Capillary: 169 mg/dL — ABNORMAL HIGH (ref 70–99)
Glucose-Capillary: 200 mg/dL — ABNORMAL HIGH (ref 70–99)
Glucose-Capillary: 169 mg/dL — ABNORMAL HIGH (ref 70–99)
Glucose-Capillary: 212 mg/dL — ABNORMAL HIGH (ref 70–99)
Glucose-Capillary: 209 mg/dL — ABNORMAL HIGH (ref 70–99)
Glucose-Capillary: 205 mg/dL — ABNORMAL HIGH (ref 70–99)
Glucose-Capillary: 196 mg/dL — ABNORMAL HIGH (ref 70–99)
Glucose-Capillary: 201 mg/dL — ABNORMAL HIGH (ref 70–99)
Glucose-Capillary: 212 mg/dL — ABNORMAL HIGH (ref 70–99)
Glucose-Capillary: 234 mg/dL — ABNORMAL HIGH (ref 70–99)
Glucose-Capillary: 209 mg/dL — ABNORMAL HIGH (ref 70–99)
Glucose-Capillary: 248 mg/dL — ABNORMAL HIGH (ref 70–99)
Glucose-Capillary: 272 mg/dL — ABNORMAL HIGH (ref 70–99)
Glucose-Capillary: 246 mg/dL — ABNORMAL HIGH (ref 70–99)
Glucose-Capillary: 199 mg/dL — ABNORMAL HIGH (ref 70–99)
Glucose-Capillary: 237 mg/dL — ABNORMAL HIGH (ref 70–99)
Glucose-Capillary: 284 mg/dL — ABNORMAL HIGH (ref 70–99)

## 2023-05-11 LAB — BASIC METABOLIC PANEL
Anion gap: 15 (ref 5–15)
BUN: 23 mg/dL — ABNORMAL HIGH (ref 6–20)
CO2: 27 mmol/L (ref 22–32)
Calcium: 8.6 mg/dL — ABNORMAL LOW (ref 8.9–10.3)
Chloride: 87 mmol/L — ABNORMAL LOW (ref 98–111)
Creatinine, Ser: 1.72 mg/dL — ABNORMAL HIGH (ref 0.44–1.00)
GFR, Estimated: 34 mL/min — ABNORMAL LOW (ref >60)
Glucose, Bld: 219 mg/dL — ABNORMAL HIGH (ref 70–99)
Potassium: 3.8 mmol/L (ref 3.5–5.1)
Sodium: 129 mmol/L — ABNORMAL LOW (ref 135–145)

## 2023-05-11 LAB — ECHO INTRAOPERATIVE TEE
Height: 64 in
Weight: 3488.56 oz

## 2023-05-11 LAB — ECHO TEE: Est EF: 55

## 2023-05-11 LAB — BPAM RBC
Blood Product Expiration Date: 202408222359
Blood Product Expiration Date: 202408232359
Blood Product Expiration Date: 202408232359
ISSUE DATE / TIME: 202407190924
ISSUE DATE / TIME: 202407191130
ISSUE DATE / TIME: 202407191130
Unit Type and Rh: 5100
Unit Type and Rh: 5100
Unit Type and Rh: 5100

## 2023-05-11 LAB — PROTIME-INR
INR: 1.5 — ABNORMAL HIGH (ref 0.8–1.2)
Prothrombin Time: 18.5 seconds — ABNORMAL HIGH (ref 11.4–15.2)

## 2023-05-11 LAB — APTT
aPTT: 62 seconds — ABNORMAL HIGH (ref 24–36)
aPTT: 35 seconds (ref 24–36)

## 2023-05-11 LAB — PLATELET COUNT: Platelets: 95 10*3/uL — ABNORMAL LOW (ref 150–400)

## 2023-05-11 LAB — HEMOGLOBIN AND HEMATOCRIT, BLOOD
HCT: 28.1 % — ABNORMAL LOW (ref 36.0–46.0)
Hemoglobin: 9.1 g/dL — ABNORMAL LOW (ref 12.0–15.0)

## 2023-05-11 LAB — HEPARIN LEVEL (UNFRACTIONATED): Heparin Unfractionated: 0.26 IU/mL — ABNORMAL LOW (ref 0.30–0.70)

## 2023-05-11 SURGERY — REPAIR, VENTRICULAR SEPTAL DEFECT
Anesthesia: General

## 2023-05-11 MED ORDER — PHENYLEPHRINE 80 MCG/ML (10ML) SYRINGE FOR IV PUSH (FOR BLOOD PRESSURE SUPPORT)
PREFILLED_SYRINGE | INTRAVENOUS | Status: AC
  Filled 2023-05-11: qty 10

## 2023-05-11 MED ORDER — HEMOSTATIC AGENTS (NO CHARGE) OPTIME
TOPICAL | Status: DC | PRN
  Administered 2023-05-11: 1 via TOPICAL

## 2023-05-11 MED ORDER — MIDAZOLAM HCL (PF) 10 MG/2ML IJ SOLN
INTRAMUSCULAR | Status: AC
  Filled 2023-05-11: qty 2

## 2023-05-11 MED ORDER — MILRINONE LACTATE IN DEXTROSE 20-5 MG/100ML-% IV SOLN
0.1250 ug/kg/min | INTRAVENOUS | Status: DC
  Administered 2023-05-11 – 2023-05-14 (×8): 0.375 ug/kg/min via INTRAVENOUS
  Administered 2023-05-15: 0.25 ug/kg/min via INTRAVENOUS
  Administered 2023-05-15: 0.375 ug/kg/min via INTRAVENOUS
  Administered 2023-05-16: 0.125 ug/kg/min via INTRAVENOUS
  Administered 2023-05-16: 0.25 ug/kg/min via INTRAVENOUS
  Administered 2023-05-17: 0.125 ug/kg/min via INTRAVENOUS
  Filled 2023-05-11 (×14): qty 100

## 2023-05-11 MED ORDER — PANTOPRAZOLE SODIUM 40 MG PO TBEC
40.0000 mg | DELAYED_RELEASE_TABLET | Freq: Every day | ORAL | Status: DC
  Administered 2023-05-13 – 2023-05-29 (×17): 40 mg via ORAL
  Filled 2023-05-11 (×17): qty 1

## 2023-05-11 MED ORDER — PANTOPRAZOLE SODIUM 40 MG IV SOLR
40.0000 mg | Freq: Every day | INTRAVENOUS | Status: AC
  Administered 2023-05-11 – 2023-05-12 (×2): 40 mg via INTRAVENOUS
  Filled 2023-05-11 (×2): qty 10

## 2023-05-11 MED ORDER — ACETAMINOPHEN 500 MG PO TABS
1000.0000 mg | ORAL_TABLET | Freq: Four times a day (QID) | ORAL | Status: AC
  Administered 2023-05-12 – 2023-05-16 (×16): 1000 mg via ORAL
  Filled 2023-05-11 (×16): qty 2

## 2023-05-11 MED ORDER — LIDOCAINE 2% (20 MG/ML) 5 ML SYRINGE
INTRAMUSCULAR | Status: DC | PRN
  Administered 2023-05-11: 100 mg via INTRAVENOUS

## 2023-05-11 MED ORDER — MORPHINE SULFATE (PF) 2 MG/ML IV SOLN
1.0000 mg | INTRAVENOUS | Status: DC | PRN
  Administered 2023-05-11: 4 mg via INTRAVENOUS
  Administered 2023-05-11 – 2023-05-12 (×3): 2 mg via INTRAVENOUS
  Administered 2023-05-12 (×2): 4 mg via INTRAVENOUS
  Administered 2023-05-12 (×4): 2 mg via INTRAVENOUS
  Administered 2023-05-12: 4 mg via INTRAVENOUS
  Administered 2023-05-12: 2 mg via INTRAVENOUS
  Administered 2023-05-13: 4 mg via INTRAVENOUS
  Administered 2023-05-13: 2 mg via INTRAVENOUS
  Administered 2023-05-13: 4 mg via INTRAVENOUS
  Administered 2023-05-13: 2 mg via INTRAVENOUS
  Administered 2023-05-13: 4 mg via INTRAVENOUS
  Administered 2023-05-13 – 2023-05-16 (×5): 2 mg via INTRAVENOUS
  Filled 2023-05-11: qty 1
  Filled 2023-05-11: qty 2
  Filled 2023-05-11 (×2): qty 1
  Filled 2023-05-11: qty 2
  Filled 2023-05-11: qty 1
  Filled 2023-05-11 (×3): qty 2
  Filled 2023-05-11: qty 1
  Filled 2023-05-11: qty 2
  Filled 2023-05-11 (×3): qty 1
  Filled 2023-05-11: qty 2
  Filled 2023-05-11 (×7): qty 1

## 2023-05-11 MED ORDER — LEVOFLOXACIN IN D5W 750 MG/150ML IV SOLN
750.0000 mg | INTRAVENOUS | Status: AC
  Administered 2023-05-12: 750 mg via INTRAVENOUS
  Filled 2023-05-11: qty 150

## 2023-05-11 MED ORDER — LACTATED RINGERS IV SOLN: INTRAVENOUS | Status: DC | PRN

## 2023-05-11 MED ORDER — SODIUM CHLORIDE 0.9% FLUSH
3.0000 mL | Freq: Two times a day (BID) | INTRAVENOUS | Status: DC
  Administered 2023-05-12 (×2): 3 mL via INTRAVENOUS

## 2023-05-11 MED ORDER — PROPOFOL 10 MG/ML IV BOLUS
INTRAVENOUS | Status: AC
  Filled 2023-05-11: qty 20

## 2023-05-11 MED ORDER — ROSUVASTATIN CALCIUM 20 MG PO TABS
40.0000 mg | ORAL_TABLET | Freq: Every day | ORAL | Status: DC
  Administered 2023-05-13 – 2023-05-29 (×17): 40 mg via ORAL
  Filled 2023-05-11 (×17): qty 2

## 2023-05-11 MED ORDER — FENTANYL CITRATE (PF) 250 MCG/5ML IJ SOLN
INTRAMUSCULAR | Status: AC
  Filled 2023-05-11: qty 5

## 2023-05-11 MED ORDER — SODIUM CHLORIDE 0.9 % IV SOLN: INTRAVENOUS | Status: DC | PRN

## 2023-05-11 MED ORDER — VANCOMYCIN HCL IN DEXTROSE 1-5 GM/200ML-% IV SOLN
1000.0000 mg | Freq: Once | INTRAVENOUS | Status: AC
  Administered 2023-05-11: 1000 mg via INTRAVENOUS
  Filled 2023-05-11: qty 200

## 2023-05-11 MED ORDER — LACTATED RINGERS IV SOLN: INTRAVENOUS | Status: DC

## 2023-05-11 MED ORDER — MAGNESIUM SULFATE 4 GM/100ML IV SOLN
4.0000 g | Freq: Once | INTRAVENOUS | Status: AC
  Administered 2023-05-11: 4 g via INTRAVENOUS

## 2023-05-11 MED ORDER — DOBUTAMINE INFUSION FOR EP/ECHO/NUC (1000 MCG/ML)
INTRAVENOUS | Status: DC | PRN
  Administered 2023-05-11: 2.5 ug/kg/min via INTRAVENOUS

## 2023-05-11 MED ORDER — HEPARIN SODIUM (PORCINE) 1000 UNIT/ML IJ SOLN
INTRAMUSCULAR | Status: DC | PRN
  Administered 2023-05-11: 34000 [IU] via INTRAVENOUS

## 2023-05-11 MED ORDER — DEXMEDETOMIDINE HCL IN NACL 400 MCG/100ML IV SOLN
0.0000 ug/kg/h | INTRAVENOUS | Status: DC
  Administered 2023-05-11: 1.4 ug/kg/h via INTRAVENOUS
  Administered 2023-05-11: 0.3 ug/kg/h via INTRAVENOUS
  Administered 2023-05-12 (×3): 1.4 ug/kg/h via INTRAVENOUS
  Filled 2023-05-11 (×4): qty 100

## 2023-05-11 MED ORDER — ROCURONIUM BROMIDE 10 MG/ML (PF) SYRINGE
PREFILLED_SYRINGE | INTRAVENOUS | Status: AC
  Filled 2023-05-11: qty 20

## 2023-05-11 MED ORDER — METOCLOPRAMIDE HCL 5 MG/ML IJ SOLN: 10.0000 mg | Freq: Four times a day (QID) | INTRAMUSCULAR | Status: DC

## 2023-05-11 MED ORDER — 0.9 % SODIUM CHLORIDE (POUR BTL) OPTIME
TOPICAL | Status: DC | PRN
  Administered 2023-05-11: 5000 mL

## 2023-05-11 MED ORDER — FUROSEMIDE 10 MG/ML IJ SOLN
20.0000 mg | INTRAMUSCULAR | Status: AC
  Administered 2023-05-11: 20 mg via INTRAVENOUS
  Filled 2023-05-11: qty 2

## 2023-05-11 MED ORDER — ROCURONIUM BROMIDE 10 MG/ML (PF) SYRINGE
PREFILLED_SYRINGE | INTRAVENOUS | Status: AC
  Filled 2023-05-11: qty 10

## 2023-05-11 MED ORDER — METOPROLOL TARTRATE 12.5 MG HALF TABLET: 12.5000 mg | ORAL_TABLET | Freq: Two times a day (BID) | ORAL | Status: DC

## 2023-05-11 MED ORDER — DOBUTAMINE-DEXTROSE 4-5 MG/ML-% IV SOLN
2.5000 ug/kg/min | INTRAVENOUS | Status: DC
  Filled 2023-05-11: qty 250

## 2023-05-11 MED ORDER — ALBUMIN HUMAN 5 % IV SOLN
250.0000 mL | INTRAVENOUS | Status: AC | PRN
  Administered 2023-05-11 (×6): 12.5 g via INTRAVENOUS
  Filled 2023-05-11 (×3): qty 250

## 2023-05-11 MED ORDER — SODIUM CHLORIDE (PF) 0.9 % IJ SOLN
INTRAMUSCULAR | Status: AC
  Filled 2023-05-11: qty 10

## 2023-05-11 MED ORDER — PHENYLEPHRINE 80 MCG/ML (10ML) SYRINGE FOR IV PUSH (FOR BLOOD PRESSURE SUPPORT)
PREFILLED_SYRINGE | INTRAVENOUS | Status: DC | PRN
  Administered 2023-05-11 (×6): 80 ug via INTRAVENOUS
  Administered 2023-05-11: 160 ug via INTRAVENOUS
  Administered 2023-05-11 (×3): 80 ug via INTRAVENOUS
  Administered 2023-05-11: 40 ug via INTRAVENOUS

## 2023-05-11 MED ORDER — PROTAMINE SULFATE 10 MG/ML IV SOLN
INTRAVENOUS | Status: AC
  Filled 2023-05-11: qty 25

## 2023-05-11 MED ORDER — ETOMIDATE 2 MG/ML IV SOLN
INTRAVENOUS | Status: AC
  Filled 2023-05-11: qty 10

## 2023-05-11 MED ORDER — CHLORHEXIDINE GLUCONATE 0.12 % MT SOLN
15.0000 mL | OROMUCOSAL | Status: AC
  Administered 2023-05-11: 15 mL via OROMUCOSAL
  Filled 2023-05-11: qty 15

## 2023-05-11 MED ORDER — SODIUM CHLORIDE (PF) 0.9 % IJ SOLN
OROMUCOSAL | Status: DC | PRN
  Administered 2023-05-11 (×2): 4 mL via TOPICAL

## 2023-05-11 MED ORDER — INSULIN REGULAR(HUMAN) IN NACL 100-0.9 UT/100ML-% IV SOLN
INTRAVENOUS | Status: AC
  Administered 2023-05-11: 12 [IU]/h via INTRAVENOUS
  Administered 2023-05-12: 7.5 [IU]/h via INTRAVENOUS
  Administered 2023-05-12: 26 [IU]/h via INTRAVENOUS
  Administered 2023-05-13: 6 [IU]/h via INTRAVENOUS
  Administered 2023-05-14: 3.8 [IU]/h via INTRAVENOUS
  Filled 2023-05-11 (×6): qty 100

## 2023-05-11 MED ORDER — ASPIRIN 81 MG PO CHEW
324.0000 mg | CHEWABLE_TABLET | Freq: Every day | ORAL | Status: DC
  Filled 2023-05-11: qty 4

## 2023-05-11 MED ORDER — PROPOFOL 10 MG/ML IV BOLUS: INTRAVENOUS | Status: DC | PRN

## 2023-05-11 MED ORDER — BISACODYL 10 MG RE SUPP: 10.0000 mg | Freq: Every day | RECTAL | Status: DC

## 2023-05-11 MED ORDER — ORAL CARE MOUTH RINSE
15.0000 mL | OROMUCOSAL | Status: DC
  Administered 2023-05-11 – 2023-05-12 (×12): 15 mL via OROMUCOSAL

## 2023-05-11 MED ORDER — GABAPENTIN 300 MG PO CAPS
300.0000 mg | ORAL_CAPSULE | Freq: Two times a day (BID) | ORAL | Status: DC
  Administered 2023-05-12 – 2023-05-29 (×34): 300 mg via ORAL
  Filled 2023-05-11 (×34): qty 1

## 2023-05-11 MED ORDER — LEVOTHYROXINE SODIUM 50 MCG PO TABS
25.0000 ug | ORAL_TABLET | Freq: Every day | ORAL | Status: DC
  Administered 2023-05-12 – 2023-05-29 (×17): 25 ug via ORAL
  Filled 2023-05-11 (×17): qty 1

## 2023-05-11 MED ORDER — VASOPRESSIN 20 UNITS/100 ML INFUSION FOR SHOCK
INTRAVENOUS | Status: AC
  Filled 2023-05-11: qty 100

## 2023-05-11 MED ORDER — METOPROLOL TARTRATE 25 MG/10 ML ORAL SUSPENSION: 12.5000 mg | Freq: Two times a day (BID) | ORAL | Status: DC

## 2023-05-11 MED ORDER — LIDOCAINE 2% (20 MG/ML) 5 ML SYRINGE
INTRAMUSCULAR | Status: AC
  Filled 2023-05-11: qty 5

## 2023-05-11 MED ORDER — PROPOFOL 10 MG/ML IV BOLUS
INTRAVENOUS | Status: DC | PRN
  Administered 2023-05-11: 30 mg via INTRAVENOUS
  Administered 2023-05-11: 20 mg via INTRAVENOUS
  Administered 2023-05-11: 50 mg via INTRAVENOUS

## 2023-05-11 MED ORDER — NICARDIPINE HCL IN NACL 20-0.86 MG/200ML-% IV SOLN: 0.0000 mg/h | INTRAVENOUS | Status: DC

## 2023-05-11 MED ORDER — PROTAMINE SULFATE 10 MG/ML IV SOLN
INTRAVENOUS | Status: AC
  Filled 2023-05-11: qty 10

## 2023-05-11 MED ORDER — METOPROLOL TARTRATE 5 MG/5ML IV SOLN: 2.5000 mg | INTRAVENOUS | Status: DC | PRN

## 2023-05-11 MED ORDER — MIDAZOLAM HCL 2 MG/2ML IJ SOLN
2.0000 mg | INTRAMUSCULAR | Status: DC | PRN
  Administered 2023-05-11 – 2023-05-12 (×3): 2 mg via INTRAVENOUS
  Filled 2023-05-11 (×4): qty 2

## 2023-05-11 MED ORDER — NOREPINEPHRINE 4 MG/250ML-% IV SOLN
0.0000 ug/min | INTRAVENOUS | Status: DC
  Administered 2023-05-11: 12 ug/min via INTRAVENOUS
  Administered 2023-05-12: 10 ug/min via INTRAVENOUS
  Filled 2023-05-11 (×3): qty 250

## 2023-05-11 MED ORDER — SUCCINYLCHOLINE CHLORIDE 200 MG/10ML IV SOSY
PREFILLED_SYRINGE | INTRAVENOUS | Status: AC
  Filled 2023-05-11: qty 10

## 2023-05-11 MED ORDER — PLASMA-LYTE A IV SOLN
INTRAVENOUS | Status: DC | PRN
  Administered 2023-05-11: 500 mL via INTRAVASCULAR

## 2023-05-11 MED ORDER — ORAL CARE MOUTH RINSE: 15.0000 mL | OROMUCOSAL | Status: DC | PRN

## 2023-05-11 MED ORDER — HEPARIN SODIUM (PORCINE) 1000 UNIT/ML IJ SOLN
INTRAMUSCULAR | Status: AC
  Filled 2023-05-11: qty 10

## 2023-05-11 MED ORDER — SODIUM CHLORIDE 0.9 % IV SOLN: INTRAVENOUS | Status: DC

## 2023-05-11 MED ORDER — OXYCODONE HCL 5 MG PO TABS
5.0000 mg | ORAL_TABLET | ORAL | Status: DC | PRN
  Administered 2023-05-12 – 2023-05-16 (×4): 10 mg via ORAL
  Administered 2023-05-16: 5 mg via ORAL
  Administered 2023-05-16 – 2023-05-29 (×42): 10 mg via ORAL
  Filled 2023-05-11 (×9): qty 2
  Filled 2023-05-11: qty 1
  Filled 2023-05-11 (×37): qty 2

## 2023-05-11 MED ORDER — EPINEPHRINE HCL 5 MG/250ML IV SOLN IN NS
0.5000 ug/min | INTRAVENOUS | Status: DC
  Filled 2023-05-11: qty 250

## 2023-05-11 MED ORDER — ACETAMINOPHEN 160 MG/5ML PO SOLN
650.0000 mg | Freq: Once | ORAL | Status: AC
  Administered 2023-05-11: 650 mg
  Filled 2023-05-11: qty 20.3

## 2023-05-11 MED ORDER — CHLORHEXIDINE GLUCONATE CLOTH 2 % EX PADS
6.0000 | MEDICATED_PAD | Freq: Every day | CUTANEOUS | Status: DC
  Administered 2023-05-11 – 2023-05-28 (×18): 6 via TOPICAL

## 2023-05-11 MED ORDER — HEPARIN SODIUM (PORCINE) 1000 UNIT/ML IJ SOLN
INTRAMUSCULAR | Status: AC
  Filled 2023-05-11: qty 1

## 2023-05-11 MED ORDER — ASPIRIN 81 MG PO CHEW
324.0000 mg | CHEWABLE_TABLET | Freq: Once | ORAL | Status: AC
  Administered 2023-05-11: 324 mg via ORAL

## 2023-05-11 MED ORDER — ALBUMIN HUMAN 5 % IV SOLN
12.5000 g | Freq: Once | INTRAVENOUS | Status: AC
  Administered 2023-05-11: 12.5 g via INTRAVENOUS
  Filled 2023-05-11: qty 250

## 2023-05-11 MED ORDER — FAMOTIDINE IN NACL 20-0.9 MG/50ML-% IV SOLN
INTRAVENOUS | Status: AC
  Filled 2023-05-11: qty 50

## 2023-05-11 MED ORDER — SODIUM CHLORIDE 0.9% FLUSH
3.0000 mL | INTRAVENOUS | Status: DC | PRN
  Administered 2023-05-12: 3 mL via INTRAVENOUS

## 2023-05-11 MED ORDER — PROTAMINE SULFATE 10 MG/ML IV SOLN
INTRAVENOUS | Status: DC | PRN
  Administered 2023-05-11: 340 mg via INTRAVENOUS

## 2023-05-11 MED ORDER — SODIUM CHLORIDE 0.9 % IV SOLN
250.0000 mL | INTRAVENOUS | Status: DC
  Administered 2023-05-20: 250 mL via INTRAVENOUS

## 2023-05-11 MED ORDER — ROCURONIUM BROMIDE 10 MG/ML (PF) SYRINGE
PREFILLED_SYRINGE | INTRAVENOUS | Status: DC | PRN
  Administered 2023-05-11: 50 mg via INTRAVENOUS
  Administered 2023-05-11: 100 mg via INTRAVENOUS

## 2023-05-11 MED ORDER — AMITRIPTYLINE HCL 50 MG PO TABS
50.0000 mg | ORAL_TABLET | Freq: Every day | ORAL | Status: DC
  Administered 2023-05-12 – 2023-05-28 (×17): 50 mg via ORAL
  Filled 2023-05-11 (×19): qty 1

## 2023-05-11 MED ORDER — DULOXETINE HCL 60 MG PO CPEP
60.0000 mg | ORAL_CAPSULE | Freq: Two times a day (BID) | ORAL | Status: DC
  Administered 2023-05-12 – 2023-05-29 (×34): 60 mg via ORAL
  Filled 2023-05-11 (×35): qty 1

## 2023-05-11 MED ORDER — MIDAZOLAM HCL (PF) 5 MG/ML IJ SOLN
INTRAMUSCULAR | Status: DC | PRN
  Administered 2023-05-11: 1 mg via INTRAVENOUS
  Administered 2023-05-11: 2 mg via INTRAVENOUS
  Administered 2023-05-11: 1 mg via INTRAVENOUS
  Administered 2023-05-11: 2 mg via INTRAVENOUS

## 2023-05-11 MED ORDER — LINACLOTIDE 145 MCG PO CAPS
145.0000 ug | ORAL_CAPSULE | Freq: Every day | ORAL | Status: DC
  Administered 2023-05-14 – 2023-05-24 (×11): 145 ug via ORAL
  Filled 2023-05-11 (×13): qty 1

## 2023-05-11 MED ORDER — VASOPRESSIN 20 UNITS/100 ML INFUSION FOR SHOCK
0.0000 [IU]/min | INTRAVENOUS | Status: DC
  Administered 2023-05-11 – 2023-05-14 (×7): 0.04 [IU]/min via INTRAVENOUS
  Administered 2023-05-14: 0.02 [IU]/min via INTRAVENOUS
  Filled 2023-05-11 (×8): qty 100

## 2023-05-11 MED ORDER — MAGNESIUM SULFATE 4 GM/100ML IV SOLN
INTRAVENOUS | Status: AC
  Filled 2023-05-11: qty 100

## 2023-05-11 MED ORDER — DOCUSATE SODIUM 100 MG PO CAPS
200.0000 mg | ORAL_CAPSULE | Freq: Every day | ORAL | Status: DC
  Administered 2023-05-13: 200 mg via ORAL
  Filled 2023-05-11 (×2): qty 2

## 2023-05-11 MED ORDER — ASPIRIN 325 MG PO TBEC
325.0000 mg | DELAYED_RELEASE_TABLET | Freq: Every day | ORAL | Status: DC
  Administered 2023-05-12: 325 mg via ORAL

## 2023-05-11 MED ORDER — ACETAMINOPHEN 160 MG/5ML PO SOLN
1000.0000 mg | Freq: Four times a day (QID) | ORAL | Status: AC
  Administered 2023-05-12 (×2): 1000 mg
  Filled 2023-05-11 (×2): qty 40.6

## 2023-05-11 MED ORDER — BISACODYL 5 MG PO TBEC
10.0000 mg | DELAYED_RELEASE_TABLET | Freq: Every day | ORAL | Status: DC
  Administered 2023-05-13: 10 mg via ORAL
  Filled 2023-05-11 (×2): qty 2

## 2023-05-11 MED ORDER — DEXTROSE 50 % IV SOLN: 0.0000 mL | INTRAVENOUS | Status: DC | PRN

## 2023-05-11 MED ORDER — FENTANYL CITRATE (PF) 250 MCG/5ML IJ SOLN
INTRAMUSCULAR | Status: DC | PRN
  Administered 2023-05-11: 100 ug via INTRAVENOUS
  Administered 2023-05-11: 50 ug via INTRAVENOUS
  Administered 2023-05-11: 100 ug via INTRAVENOUS
  Administered 2023-05-11: 200 ug via INTRAVENOUS
  Administered 2023-05-11: 100 ug via INTRAVENOUS
  Administered 2023-05-11: 150 ug via INTRAVENOUS
  Administered 2023-05-11: 100 ug via INTRAVENOUS
  Administered 2023-05-11: 50 ug via INTRAVENOUS
  Administered 2023-05-11: 150 ug via INTRAVENOUS
  Administered 2023-05-11: 100 ug via INTRAVENOUS
  Administered 2023-05-11: 50 ug via INTRAVENOUS
  Administered 2023-05-11: 100 ug via INTRAVENOUS

## 2023-05-11 MED ORDER — SODIUM BICARBONATE 8.4 % IV SOLN
50.0000 meq | Freq: Once | INTRAVENOUS | Status: AC
  Administered 2023-05-11: 50 meq via INTRAVENOUS

## 2023-05-11 MED ORDER — SODIUM CHLORIDE 0.45 % IV SOLN: INTRAVENOUS | Status: DC | PRN

## 2023-05-11 MED ORDER — POTASSIUM CHLORIDE 10 MEQ/50ML IV SOLN
10.0000 meq | INTRAVENOUS | Status: AC
  Administered 2023-05-11 (×3): 10 meq via INTRAVENOUS

## 2023-05-11 MED ORDER — PHENYLEPHRINE 80 MCG/ML (10ML) SYRINGE FOR IV PUSH (FOR BLOOD PRESSURE SUPPORT)
PREFILLED_SYRINGE | INTRAVENOUS | Status: AC
  Filled 2023-05-11: qty 20

## 2023-05-11 MED ORDER — ONDANSETRON HCL 4 MG/2ML IJ SOLN
4.0000 mg | Freq: Four times a day (QID) | INTRAMUSCULAR | Status: DC | PRN
  Administered 2023-05-13 (×2): 4 mg via INTRAVENOUS
  Filled 2023-05-11 (×2): qty 2

## 2023-05-11 MED ORDER — TRAMADOL HCL 50 MG PO TABS
50.0000 mg | ORAL_TABLET | ORAL | Status: DC | PRN
  Administered 2023-05-12: 50 mg via ORAL
  Administered 2023-05-13 – 2023-05-15 (×2): 100 mg via ORAL
  Administered 2023-05-15: 50 mg via ORAL
  Administered 2023-05-16 (×2): 100 mg via ORAL
  Administered 2023-05-16: 50 mg via ORAL
  Administered 2023-05-17 – 2023-05-27 (×21): 100 mg via ORAL
  Filled 2023-05-11 (×3): qty 2
  Filled 2023-05-11: qty 1
  Filled 2023-05-11 (×5): qty 2
  Filled 2023-05-11: qty 1
  Filled 2023-05-11 (×12): qty 2
  Filled 2023-05-11: qty 1
  Filled 2023-05-11 (×6): qty 2

## 2023-05-11 SURGICAL SUPPLY — 101 items
ADH SRG 12 PREFL SYR 3 SPRDR (MISCELLANEOUS) ×1
BAG DECANTER FOR FLEXI CONT (MISCELLANEOUS) ×1 IMPLANT
BLADE STERNUM SYSTEM 6 (BLADE) ×1 IMPLANT
BLADE SURG 11 STRL SS (BLADE) IMPLANT
BNDG ELASTIC 4X5.8 VLCR STR LF (GAUZE/BANDAGES/DRESSINGS) ×1 IMPLANT
BNDG ELASTIC 6X5.8 VLCR STR LF (GAUZE/BANDAGES/DRESSINGS) ×1 IMPLANT
BNDG GAUZE DERMACEA FLUFF 4 (GAUZE/BANDAGES/DRESSINGS) ×1 IMPLANT
BNDG GZE DERMACEA 4 6PLY (GAUZE/BANDAGES/DRESSINGS) ×1
CABLE SURGICAL S-101-97-12 (CABLE) ×1 IMPLANT
CANISTER SUCT 3000ML PPV (MISCELLANEOUS) ×1 IMPLANT
CANISTER WOUNDNEG PRESSURE 500 (CANNISTER) IMPLANT
CANN PRFSN 3/8XCNCT ST RT ANG (MISCELLANEOUS) ×1
CANN PRFSN 3/8XRT ANG TPR 14 (MISCELLANEOUS) ×1
CANNULA MC2 2 STG 29/37 NON-V (CANNULA) ×1 IMPLANT
CANNULA NON VENT 20FR 12 (CANNULA) ×1 IMPLANT
CANNULA PRFSN 3/8XCNCT RT ANG (MISCELLANEOUS) IMPLANT
CANNULA PRFSN 3/8XRT ANG TPR14 (MISCELLANEOUS) IMPLANT
CANNULA SUMP PERICARDIAL (CANNULA) IMPLANT
CANNULA VEN MTL TIP RT (MISCELLANEOUS) ×2
CATH ROBINSON RED A/P 18FR (CATHETERS) ×2 IMPLANT
CONN 1/2X1/2X1/2 BEN (MISCELLANEOUS) IMPLANT
CONN ST 1/2X1/2 BEN (MISCELLANEOUS) ×1 IMPLANT
CONN ST 1/4X3/8 BEN (MISCELLANEOUS) IMPLANT
CONN ST 3/8 X 1/2 (MISCELLANEOUS) IMPLANT
CONNECTOR BLAKE 2:1 CARIO BLK (MISCELLANEOUS) ×1 IMPLANT
CONTAINER PROTECT SURGISLUSH (MISCELLANEOUS) ×2 IMPLANT
DRAIN CHANNEL 19F RND (DRAIN) ×3 IMPLANT
DRAIN CHANNEL 28F RND 3/8 FF (WOUND CARE) IMPLANT
DRAIN CONNECTOR BLAKE 1:1 (MISCELLANEOUS) ×1 IMPLANT
DRAPE CARDIOVASCULAR INCISE (DRAPES) ×1
DRAPE INCISE IOBAN 66X45 STRL (DRAPES) IMPLANT
DRAPE SRG 135X102X78XABS (DRAPES) ×1 IMPLANT
DRAPE WARM FLUID 44X44 (DRAPES) ×1 IMPLANT
DRESSING PEEL AND PLAC PRVNA20 (GAUZE/BANDAGES/DRESSINGS) IMPLANT
DRSG AQUACEL AG ADV 3.5X10 (GAUZE/BANDAGES/DRESSINGS) ×1 IMPLANT
DRSG PEEL AND PLACE PREVENA 20 (GAUZE/BANDAGES/DRESSINGS) ×1
ELECT BLADE 4.0 EZ CLEAN MEGAD (MISCELLANEOUS) ×1
ELECT REM PT RETURN 9FT ADLT (ELECTROSURGICAL) ×2
ELECTRODE BLDE 4.0 EZ CLN MEGD (MISCELLANEOUS) ×1 IMPLANT
ELECTRODE REM PT RTRN 9FT ADLT (ELECTROSURGICAL) ×2 IMPLANT
FELT TEFLON 1X6 (MISCELLANEOUS) ×2 IMPLANT
FELT TEFLON 6X6 (MISCELLANEOUS) IMPLANT
GAUZE SPONGE 4X4 12PLY STRL (GAUZE/BANDAGES/DRESSINGS) ×2 IMPLANT
GAUZE SPONGE 4X4 12PLY STRL LF (GAUZE/BANDAGES/DRESSINGS) IMPLANT
GLOVE BIO SURGEON STRL SZ 6.5 (GLOVE) IMPLANT
GLOVE BIO SURGEON STRL SZ7 (GLOVE) ×2 IMPLANT
GLOVE BIO SURGEON STRL SZ7.5 (GLOVE) IMPLANT
GLOVE BIOGEL M STRL SZ7.5 (GLOVE) ×2 IMPLANT
GLOVE BIOGEL PI IND STRL 6 (GLOVE) IMPLANT
GLOVE BIOGEL PI IND STRL 6.5 (GLOVE) IMPLANT
GLOVE SS BIOGEL STRL SZ 6 (GLOVE) IMPLANT
GOWN STRL REUS W/ TWL LRG LVL3 (GOWN DISPOSABLE) ×4 IMPLANT
GOWN STRL REUS W/ TWL XL LVL3 (GOWN DISPOSABLE) ×2 IMPLANT
GOWN STRL REUS W/TWL LRG LVL3 (GOWN DISPOSABLE) ×6
GOWN STRL REUS W/TWL XL LVL3 (GOWN DISPOSABLE) ×2
HEMOSTAT POWDER SURGIFOAM 1G (HEMOSTASIS) ×2 IMPLANT
INSERT FOGARTY XLG (MISCELLANEOUS) IMPLANT
INSERT SUTURE HOLDER (MISCELLANEOUS) ×1 IMPLANT
KIT BASIN OR (CUSTOM PROCEDURE TRAY) ×1 IMPLANT
KIT SUCTION CATH 14FR (SUCTIONS) ×1 IMPLANT
KIT TURNOVER KIT B (KITS) ×1 IMPLANT
LEAD PACING MYOCARDI (MISCELLANEOUS) ×1 IMPLANT
LINE VENT (MISCELLANEOUS) IMPLANT
LOOP VASCLR EXTRA MAXI WHITE (MISCELLANEOUS) IMPLANT
LOOP VASCULAR SMAXI 22 WHT (MISCELLANEOUS) ×1
LOOPS VASCLR EXTRA MAXI WHITE (MISCELLANEOUS) ×1 IMPLANT
MARKER GRAFT CORONARY BYPASS (MISCELLANEOUS) ×3 IMPLANT
NS IRRIG 1000ML POUR BTL (IV SOLUTION) ×5 IMPLANT
PACK E OPEN HEART (SUTURE) ×1 IMPLANT
PACK OPEN HEART (CUSTOM PROCEDURE TRAY) ×1 IMPLANT
PAD ARMBOARD 7.5X6 YLW CONV (MISCELLANEOUS) ×2 IMPLANT
PAD ELECT DEFIB RADIOL ZOLL (MISCELLANEOUS) ×1 IMPLANT
PENCIL BUTTON HOLSTER BLD 10FT (ELECTRODE) ×1 IMPLANT
POSITIONER ACROBAT-I OFFPUMP (MISCELLANEOUS) IMPLANT
POSITIONER HEAD DONUT 9IN (MISCELLANEOUS) ×1 IMPLANT
SET MPS 3-ND DEL (MISCELLANEOUS) IMPLANT
SUT BONE WAX W31G (SUTURE) ×1 IMPLANT
SUT ETHIBOND 2 0 SH (SUTURE) ×12 IMPLANT
SUT ETHIBOND 2 0 SH 36X2 (SUTURE) IMPLANT
SUT ETHIBOND NAB MH 2-0 36IN (SUTURE) IMPLANT
SUT ETHIBOND X763 2 0 SH 1 (SUTURE) ×2 IMPLANT
SUT MNCRL AB 3-0 PS2 18 (SUTURE) ×2 IMPLANT
SUT PDS AB 1 CTX 36 (SUTURE) ×2 IMPLANT
SUT PROLENE 4 0 SH DA (SUTURE) ×1 IMPLANT
SUT PROLENE 5 0 C 1 36 (SUTURE) ×3 IMPLANT
SUT SILK 1 MH (SUTURE) IMPLANT
SUT STEEL 6MS V (SUTURE) ×2 IMPLANT
SUT STEEL STERNAL CCS#1 18IN (SUTURE) IMPLANT
SUT VIC AB 2-0 CTX 36 (SUTURE) IMPLANT
SYR 10ML KIT SKIN ADHESIVE (MISCELLANEOUS) IMPLANT
SYSTEM SAHARA CHEST DRAIN ATS (WOUND CARE) ×1 IMPLANT
TAPE CLOTH SURG 4X10 WHT LF (GAUZE/BANDAGES/DRESSINGS) IMPLANT
TAPE PAPER 2X10 WHT MICROPORE (GAUZE/BANDAGES/DRESSINGS) IMPLANT
TOWEL GREEN STERILE (TOWEL DISPOSABLE) ×1 IMPLANT
TOWEL GREEN STERILE FF (TOWEL DISPOSABLE) ×1 IMPLANT
TRAY FOLEY SLVR 16FR TEMP STAT (SET/KITS/TRAYS/PACK) ×1 IMPLANT
TUBE CONNECTING 12X1/4 (SUCTIONS) IMPLANT
UNDERPAD 30X36 HEAVY ABSORB (UNDERPADS AND DIAPERS) ×1 IMPLANT
VASCULAR TIE EXTRA MAXI WHITE (MISCELLANEOUS) ×1
WATER STERILE IRR 1000ML POUR (IV SOLUTION) ×2 IMPLANT
YANKAUER SUCT BULB TIP NO VENT (SUCTIONS) IMPLANT

## 2023-05-11 NOTE — Consult Note (Signed)
NAME:  [name], MRN:  [id number], DOB:  [birth year], LOS: 7 ADMISSION DATE:  05/04/2023, CONSULTATION DATE: 05/11/2023 REFERRING MD: Dr. Cliffton Asters, CHIEF COMPLAINT: Chest pain  History of Present Illness:  57 year old female with a diabetes melitis, complicated with neuropathy and Wegener's granulomatosis who was initially admitted with acute anterior STEMI underwent PCI to LAD, who continues to require vasopressor support and IABP, echocardiogram showed mechanical complication including apical VSD, underwent VSD repair, remained intubated, on multiple vasopressor support, PCCM was consulted for help evaluation medical management  Pertinent  Medical History   Past Medical History:  Diagnosis Date   Chronic cough    Chronic pain    Diabetes mellitus without complication (HCC)    Dyspnea    GERD (gastroesophageal reflux disease)    Hypokalemia    Hypothyroidism    Left wrist pain 06/01/2021   Myonecrosis (HCC) 07/01/2021   Neurocardiogenic syncope    OSA (obstructive sleep apnea)    does not use CPAP   Osteomyelitis (HCC)    bilateral feet   Peripheral neuropathy    Presence of intrathecal pump    recieves Prialt/bupivicaine   Tear of gluteus medius tendon 07/01/2021   Wegener's granulomatosis 2009   Wegner's disease (congenital syphilitic osteochondritis)      Significant Hospital Events: Including procedures, antibiotic start and stop dates in addition to other pertinent events     Interim History / Subjective:  As above  Objective   Blood pressure (!) 78/60, pulse 80, temperature (!) 97.2 F (36.2 C), resp. rate 20, height 5\' 4"  (1.626 m), weight 98.9 kg, SpO2 95%. PAP: (25-33)/(13-20) 30/17 CVP:  [2 mmHg-15 mmHg] 13 mmHg CO:  [2.2 L/min-7.1 L/min] 3 L/min CI:  [1.3 L/min/m2-3.4 L/min/m2] 1.4 L/min/m2  Vent Mode: SIMV;PRVC;PSV FiO2 (%):  [50 %] 50 % Set Rate:  [16 bmp-20 bmp] 20 bmp Vt Set:  [430 mL] 430 mL PEEP:  [5 cmH20] 5 cmH20 Pressure Support:  [10 cmH20] 10  cmH20 Plateau Pressure:  [20 cmH20] 20 cmH20   Intake/Output Summary (Last 24 hours) at 05/11/2023 1728 Last data filed at 05/11/2023 1700 Gross per 24 hour  Intake 4373.95 ml  Output 4540 ml  Net -166.05 ml   Filed Weights   05/09/23 0500 05/10/23 0500 05/11/23 0556  Weight: 99.9 kg 100.2 kg 98.9 kg    Examination: General: Crtitically ill-appearing female, orally intubated HEENT: [place]/AT, eyes anicteric.  ETT and OGT in place Neuro: Sedated, not following commands.  Eyes are closed.  Pupils 3 mm bilateral reactive to light Chest: Median sternotomy wound looks clean and dry, coarse breath sounds, no wheezes or rhonchi.  Chest tube in place Heart: Regular rate and rhythm, no murmurs or gallops Abdomen: Soft, nondistended, bowel sounds present Skin: No rash  Labs and images were reviewed  Resolved Hospital Problem list     Assessment & Plan:  Coronary artery disease, presented with acute anterior STEMI, complicated with apical VSD. Apical VSD s/p surgical repair S/p PCI to LAD with DES  Continue aspirin and statin Holding Brilinta Chest tube management TCTS Continue to titrate Precedex with RASS goal 0/-1 Continue pain control with tramadol, oxycodone and morphine Closely monitor chest tube output  Acute respiratory insufficiency, postop Continue on protective ventilation VAP prevention bundle in place Rapid weaning protocol ordered is in place  Acute HFrEF with cardiogenic shock Patient ejection fraction is 45% Continue epinephrine,  norepinephrine and milrinone, continue to titrate with MAP goal 65 On IABP 1:1 Advanced heart failure team is  following  Hyperlipidemia Continue Crestor  Acute kidney injury due to ischemic ATN Hyponatremia Monitor intake and output Avoid nephrotoxic agent Closely monitor electrolytes  Diabetes type 2 Patient hemoglobin A1c is 7.6 Currently on insulin infusion, monitor fingerstick with goal 140-180  Expected perioperative blood  loss anemia 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 Patient was on Plavix, slightly oozing, closely monitor chest tube output Platelet count is 112   Best Practice (right click and "Reselect all SmartList Selections" daily)   Diet/type: NPO DVT prophylaxis: SCD GI prophylaxis: PPI Lines: Central line, Arterial Line, and yes and it is still needed Foley:  Yes, and it is still needed Code Status:  full code Last date of multidisciplinary goals of care discussion [Per primary team]  Labs   CBC: Recent Labs  Lab 05/08/23 0529 05/09/23 0452 05/10/23 0357 05/10/23 2152 05/11/23 0315 05/11/23 0908 05/11/23 1243 05/11/23 1441  WBC 10.6* 10.9* 12.1*  --  14.6*  --   --  24.5*  HGB 9.7* 10.3* 10.8* 11.2* 10.8* 10.9* 9.1* 14.1  HCT 29.9* 31.6* 32.6* 33.0* 32.7* 32.0* 28.1* 42.3  MCV 87.9 85.9 87.4  --  87.2  --   --  86.5  PLT 220 223 242  --  251  --  95* 112*    Basic Metabolic Panel: Recent Labs  Lab 05/05/23 0251 05/06/23 0255 05/06/23 1920 05/09/23 1602 05/10/23 0357 05/10/23 1158 05/10/23 2038 05/10/23 2152 05/11/23 0315 05/11/23 0908  NA 133* 129*   < > 129* 128* 124* 130* 126* 129* 127*  K 3.9 3.3*   < > 3.2* 3.0* 4.0 2.9* 3.1* 3.8 3.9  CL 103 99   < > 91* 90* 88* 87*  --  87* 89*  CO2 19* 20*   < > 25 26 23 28   --  27  --   GLUCOSE 252* 277*   < > 357* 279* 394* 221*  --  219* 214*  BUN 19 22*   < > 23* 23* 24* 23*  --  23* 23*  CREATININE 1.35* 1.45*   < > 1.65* 1.72* 1.87* 1.76*  --  1.72* 1.90*  CALCIUM 8.7* 8.0*   < > 8.4* 8.5* 8.5* 8.8*  --  8.6*  --   MG 1.8 2.3  --   --   --   --  2.0  --   --   --    < > = values in this interval not displayed.   GFR: Estimated Creatinine Clearance: 37.3 mL/min (A) (by C-G formula based on SCr of 1.9 mg/dL (H)). Recent Labs  Lab 05/06/23 1658 05/06/23 1920 05/07/23 0008 05/07/23 0416 05/07/23 0542 05/08/23 0529 05/09/23 0452 05/10/23 0357 05/11/23 0315 05/11/23 1441  WBC  --   --   --    < >  --    < > 10.9* 12.1*  14.6* 24.5*  LATICACIDVEN 3.7* 2.5* 1.9  --  1.6  --   --   --   --   --    < > = values in this interval not displayed.    Liver Function Tests: No results for input(s): "AST", "ALT", "ALKPHOS", "BILITOT", "PROT", "ALBUMIN" in the last 168 hours. No results for input(s): "LIPASE", "AMYLASE" in the last 168 hours. No results for input(s): "AMMONIA" in the last 168 hours.  ABG    Component Value Date/Time   PHART 7.562 (H) 05/10/2023 2152   PCO2ART 28.3 (L) 05/10/2023 2152   PO2ART 95 05/10/2023 2152   HCO3 25.4 05/10/2023  2152   TCO2 28 05/11/2023 0908   ACIDBASEDEF 10.0 (H) 05/06/2023 2144   O2SAT 73 05/11/2023 0315     Coagulation Profile: Recent Labs  Lab 05/11/23 1441  INR 1.5*    Cardiac Enzymes: No results for input(s): "CKTOTAL", "CKMB", "CKMBINDEX", "TROPONINI" in the last 168 hours.  HbA1C: HbA1c, POC (controlled diabetic range)  Date/Time Value Ref Range Status  04/03/2023 05:04 PM 7.5 (A) 0.0 - 7.0 % Final   Hgb A1c MFr Bld  Date/Time Value Ref Range Status  05/04/2023 06:20 AM 7.6 (H) 4.8 - 5.6 % Final    Comment:    (NOTE)         Prediabetes: 5.7 - 6.4         Diabetes: >6.4         Glycemic control for adults with diabetes: <7.0   01/04/2023 01:12 PM 7.6 (H) 4.8 - 5.6 % Final    Comment:             Prediabetes: 5.7 - 6.4          Diabetes: >6.4          Glycemic control for adults with diabetes: <7.0     CBG: Recent Labs  Lab 05/11/23 0604 05/11/23 0757 05/11/23 1441 05/11/23 1552 05/11/23 1701  GLUCAP 196* 201* 212* 234* 209*    Review of Systems:   Unable to obtain as patient is intubated and sedated  Past Medical History:  She,  has a past medical history of Chronic cough, Chronic pain, Diabetes mellitus without complication (HCC), Dyspnea, GERD (gastroesophageal reflux disease), Hypokalemia, Hypothyroidism, Left wrist pain (06/01/2021), Myonecrosis (HCC) (07/01/2021), Neurocardiogenic syncope, OSA (obstructive sleep apnea),  Osteomyelitis (HCC), Peripheral neuropathy, Presence of intrathecal pump, Tear of gluteus medius tendon (07/01/2021), Wegener's granulomatosis (2009), and Wegner's disease (congenital syphilitic osteochondritis).   Surgical History:   Past Surgical History:  Procedure Laterality Date   AMPUTATION Right 10/21/2020   Procedure: Right hallux amputation;  Surgeon: Toni Arthurs, MD;  Location: [facility];  Service: Orthopedics;  Laterality: Right;   AMPUTATION TOE Bilateral 08/12/2020   Procedure: Right 3rd toe amputation; left hallux and 3rd toe amputation;  Surgeon: Toni Arthurs, MD;  Location: [facility];  Service: Orthopedics;  Laterality: Bilateral;    AMPUTATION TOE Right 03/24/2021   Procedure: AMPUTATION TOE;  Surgeon: Toni Arthurs, MD;  Location: [facility];  Service: Orthopedics;  Laterality: Right;   APPENDECTOMY     BACK SURGERY     BIOPSY  03/30/2022   Procedure: BIOPSY;  Surgeon: Tomma Lightning, MD;  Location: [facility];  Service: Pulmonary;;   BRONCHIAL WASHINGS  03/30/2022   Procedure: BRONCHIAL WASHINGS;  Surgeon: Tomma Lightning, MD;  Location: [facility];  Service: Pulmonary;;   CHOLECYSTECTOMY     COLONOSCOPY N/A 06/02/2013   Procedure: COLONOSCOPY;  Surgeon: Graylin Shiver, MD;  Location: [facility];  Service: Endoscopy;  Laterality: N/A;   CORONARY/GRAFT ACUTE MI REVASCULARIZATION N/A 05/04/2023   Procedure: Coronary/Graft Acute MI Revascularization;  Surgeon: Tonny Bollman, MD;  Location: [facility] INVASIVE CV LAB;  Service: Cardiovascular;  Laterality: N/A;   HERNIA REPAIR  2000   IABP INSERTION N/A 05/06/2023   Procedure: IABP Insertion;  Surgeon: Dolores Patty, MD;  Location: [facility] INVASIVE CV LAB;  Service: Cardiovascular;  Laterality: N/A;   INCISION AND DRAINAGE ABSCESS Left 07/07/2016   Procedure: DEBRIDMENT LEFT THIGH ABSCESS, EXISION ACUTE SKIN RASH LEFT THIGH(1CM LESION);  Surgeon: Claud Kelp, MD;  Location: [facility];  Service:  General;  Laterality: Left;   IR FLUORO GUIDE CV LINE RIGHT  04/24/2022   IR IVC FILTER PLMT / S&I /IMG GUID/MOD SED  05/17/2022   IR US GUIDE VASC ACCESS RIGHT  04/24/2022   LEFT HEART CATH AND CORONARY ANGIOGRAPHY N/A 05/04/2023   Procedure: LEFT HEART CATH AND CORONARY ANGIOGRAPHY;  Surgeon: Tonny Bollman, MD;  Location: [facility] INVASIVE CV LAB;  Service: Cardiovascular;  Laterality: N/A;   RIGHT HEART CATH N/A 05/06/2023   Procedure: RIGHT HEART CATH;  Surgeon: Dolores Patty, MD;  Location: [facility]VAS[facility] CV LAB;  Service: Cardiovascular;  Laterality: N/A;   SEPTOPLASTY  02/2013   spleen anuyism     splenic aneurysm     TRANSMETATARSAL AMPUTATION Bilateral 10/21/2020   Procedure: Left transmetatarsal amputation;  Surgeon: Toni Arthurs, MD;  Location: [facility];  Service: Orthopedics;  Laterality: Bilateral;   TRANSMETATARSAL AMPUTATION Left 03/24/2021   Procedure: TRANSMETATARSAL AMPUTATION;  Surgeon: Toni Arthurs, MD;  Location: [facility] OR;  Service: Orthopedics;  Laterality: Left;   VAGINAL HYSTERECTOMY     VIDEO BRONCHOSCOPY Right 03/30/2022   Procedure: VIDEO BRONCHOSCOPY WITH FLUORO;  Surgeon: Tomma Lightning, MD;  Location: [facility];  Service: Pulmonary;  Laterality: Right;  atypical pneumonia     Social History:   reports that she has never smoked. She has never used smokeless tobacco. She reports current alcohol use. She reports that she does not use drugs.   Family History:  Her family history includes Diabetes in her father and mother; Heart disease in her mother.   Allergies Allergies  Allergen Reactions   Ibuprofen Other (See Comments)    Pt is unable to take this due to kidney problems.      Penicillins Rash and Other (See Comments)    Has patient had a PCN reaction causing immediate rash, facial/tongue/throat swelling, SOB or lightheadedness with hypotension: No Has patient had a PCN reaction causing severe rash involving mucus membranes or skin necrosis:  No Has patient had a PCN reaction that required hospitalization No Has patient had a PCN reaction occurring within the last 10 years: No If all of the above answers are "NO", then may proceed with Cephalosporin use.     Home Medications  Prior to Admission medications   Medication Sig Start Date End Date Taking? Authorizing Provider  acetaminophen (TYLENOL) 325 MG tablet Take 2 tablets (650 mg total) by mouth every 6 (six) hours as needed for mild pain (or Fever >/= 101). 07/21/22  Yes [name], [name]  albuterol (VENTOLIN HFA) 108 (90 Base) MCG/ACT inhaler Inhale 2 puffs into the lungs every 6 (six) hours as needed for wheezing or shortness of breath. 04/12/22  Yes Omar Person, MD  amitriptyline (ELAVIL) 50 MG tablet TAKE 1 TABLET BY MOUTH AT BEDTIME FOR SLEEP. Patient taking differently: Take 50 mg by mouth at bedtime. TAKE 1 TABLET BY MOUTH AT BEDTIME FOR SLEEP. 04/12/23  Yes [name], [name]  aspirin EC 81 MG tablet Take 162 mg by mouth daily. Swallow whole.   Yes [provider]  Calcium Carb-Cholecalciferol (CALCIUM CARBONATE-VITAMIN D3 PO) Take 1 tablet by mouth daily.   Yes [provider]  cyanocobalamin (VITAMIN B12) 1000 MCG tablet Take 1,000 mcg by mouth daily. 06/11/13  Yes [provider]  DULoxetine (CYMBALTA) 60 MG capsule Take 60 mg by mouth 2 (two) times daily. 02/26/23  Yes [provider]  levothyroxine (SYNTHROID) 25 MCG tablet Take 1  tablet (25 mcg total) by mouth daily. 02/22/23  Yes [name], [name]  LINZESS 145 MCG CAPS capsule Take one capsule (145 mcg dose) by mouth daily. 12/15/22  Yes [name], [name]  magnesium oxide (MAG-OX) 400 MG tablet Take 400 mg by mouth daily.   Yes [provider]  metFORMIN (GLUCOPHAGE) 500 MG tablet Take 1 tablet (500 mg total) by mouth 2 (two) times daily with a meal. 01/09/23  Yes [name], [name]  methocarbamol (ROBAXIN) 500 MG tablet Take 1 tablet (500 mg total) by mouth  every 6 (six) hours as needed for muscle spasms. 12/04/22  Yes [name], [name]  naloxone [facility]) nasal spray 4 mg/0.1 mL Place 0.4 mg into the nose once. 12/11/19  Yes [provider]  NUCYNTA 50 MG tablet Take 50 mg by mouth 3 (three) times daily.   Yes [provider]  pantoprazole (PROTONIX) 40 MG tablet TAKE 1 TABLET BY MOUTH TWICE DAILY BEFORE A MEAL 04/12/23  Yes Mahon, Courtney L, NP  rosuvastatin (CRESTOR) 10 MG tablet TAKE 1 TABLET BY MOUTH AT BEDTIME 04/12/23  Yes [name], [name]  topiramate (TOPAMAX) 100 MG tablet TAKE (1) TABLET BY MOUTH ONCE DAILY. 04/12/23  Yes [name], [name]  Vitamin D, Ergocalciferol, (DRISDOL) 1.25 MG (50000 UNIT) CAPS capsule Take 1 capsule (50,000 Units total) by mouth every Friday. 11/03/22  Yes [name], [name]  gabapentin (NEURONTIN) 300 MG capsule Take 1 capsule (300 mg total) by mouth at bedtime. Patient taking differently: Take 600 mg by mouth 3 (three) times daily. Patient is taking 2 in the am, 2 in the afternoon, and 2 at bedtime. 02/05/23   [name], [name]  lidocaine (LIDODERM) 5 % Place 1 patch onto the skin daily as needed (for pain). Remove & Discard patch within 12 hours or as directed by MD    [provider]     Critical care time:      The patient is critically ill due to cardiogenic shock.  Critical care was necessary to treat or prevent imminent or life-threatening deterioration.  Critical care was time spent personally by me on the following activities: development of treatment plan with patient and/or surrogate as well as nursing, discussions with consultants, evaluation of patient's response to treatment, examination of patient, obtaining history from patient or surrogate, ordering and performing treatments and interventions, ordering and review of laboratory studies, ordering and review of radiographic studies, pulse oximetry, re-evaluation of patient's condition and participation in  multidisciplinary rounds.   During this encounter critical care time was devoted to patient care services described in this note for 39 minutes.     Cheri Fowler, MD [facility] See Amion for pager If no response to pager, please call [phone] until 7pm After 7pm, Please call E-link [phone]

## 2023-05-11 NOTE — Anesthesia Procedure Notes (Signed)
Arterial Line Insertion Start/End7/19/2024 8:41 AM, 05/11/2023 8:44 AM Performed by: Rosiland Oz, CRNA, CRNA  Patient location: OR. Preanesthetic checklist: patient identified, IV checked, site marked, risks and benefits discussed, surgical consent, monitors and equipment checked, pre-op evaluation, timeout performed and anesthesia consent Right, radial was placed Catheter size: 20 G Hand hygiene performed , maximum sterile barriers used  and Seldinger technique used Allen's test indicative of satisfactory collateral circulation Attempts: 1 Procedure performed without using ultrasound guided technique. Following insertion, dressing applied and Biopatch. Post procedure assessment: normal and unchanged  Patient tolerated the procedure well with no immediate complications.

## 2023-05-11 NOTE — Anesthesia Procedure Notes (Signed)
Procedure Name: Intubation Date/Time: 05/11/2023 8:44 AM  Performed by: Drema Pry, CRNAPre-anesthesia Checklist: Patient identified, Emergency Drugs available, Suction available and Patient being monitored Patient Re-evaluated:Patient Re-evaluated prior to induction Oxygen Delivery Method: Circle system utilized Preoxygenation: Pre-oxygenation with 100% oxygen Induction Type: IV induction Ventilation: Mask ventilation without difficulty Laryngoscope Size: Mac and 3 Grade View: Grade I Tube type: Oral Tube size: 7.0 mm Number of attempts: 1 Airway Equipment and Method: Stylet Placement Confirmation: ETT inserted through vocal cords under direct vision, positive ETCO2 and breath sounds checked- equal and bilateral Secured at: 21 cm Tube secured with: Tape Dental Injury: Teeth and Oropharynx as per pre-operative assessment  Comments: 7.5 ETT too large.  7.0 ETT placed easily.  EBBS and VSS.

## 2023-05-11 NOTE — Transfer of Care (Signed)
Immediate Anesthesia Transfer of Care Note  Patient: [name]  Procedure(s) Performed: VENTRICULAR SEPTAL DEFECT (VSD) REPAIR TRANSESOPHAGEAL ECHOCARDIOGRAM  Patient Location: ICU  Anesthesia Type:General  Level of Consciousness: Patient remains intubated per anesthesia plan  Airway & Oxygen Therapy: Patient remains intubated per anesthesia plan and Patient placed on Ventilator (see vital sign flow sheet for setting)  Post-op Assessment: Report given to RN and Post -op Vital signs reviewed and stable  Post vital signs: Reviewed and stable  Last Vitals:  Vitals Value Taken Time  BP 86/53 05/11/23 1434  Temp    Pulse 80 05/11/23 1439  Resp 18 05/11/23 1439  SpO2 97 % 05/11/23 1439  Vitals shown include unfiled device data.  Last Pain:  Vitals:   05/11/23 0400  TempSrc: Core  PainSc: 0-No pain      Patients Stated Pain Goal: 3 (05/09/23 2300)  Complications: No notable events documented.

## 2023-05-11 NOTE — TOC Initial Note (Signed)
Transition of Care [facility]) - Initial/Assessment Note    Patient Details  Name: [name] MRN: [id number] Date of Birth: 09/04/1966  Transition of Care [facility]) CM/SW Contact:    Elliot Cousin, RN Phone Number: [phone] 05/11/2023, 3:36 PM  Clinical Narrative:   HF TOC CM spoke to pt at bedside. States she has scooter, RW, cane, scale, and BP cuff at home. States her PCP, [facility]. States she is hopeful to go home with [facility]. Had [facility] in the past. Will continue to follow for dc needs.                 Expected Discharge Plan: Home w Home Health Services Barriers to Discharge: Continued Medical Work up   Patient Goals and CMS Choice Patient states their goals for this hospitalization and ongoing recovery are:: wants to recover CMS Medicare.gov Compare Post Acute Care list provided to:: Patient Choice offered to / list presented to : Patient      Expected Discharge Plan and Services In-house Referral: NA Discharge Planning Services: CM Consult Post Acute Care Choice: Home Health Living arrangements for the past 2 months: Single Family Home                 DME Arranged: N/A                    Prior Living Arrangements/Services Living arrangements for the past 2 months: Single Family Home Lives with:: Spouse Patient language and need for interpreter reviewed:: Yes Do you feel safe going back to the place where you live?: Yes      Need for Family Participation in Patient Care: Yes (Comment) Care giver support system in place?: Yes (comment) Current home services: DME (motorized scooter, bedside commode, and buit in bench for bathroom.) Criminal Activity/Legal Involvement Pertinent to Current Situation/Hospitalization: No - Comment as needed  Activities of Daily Living Home Assistive Devices/Equipment: Wheelchair, Blood pressure cuff, CBG Meter ADL Screening (condition at time of admission) Patient's cognitive ability adequate to safely complete  daily activities?: Yes Is the patient deaf or have difficulty hearing?: No Does the patient have difficulty seeing, even when wearing glasses/contacts?: No Does the patient have difficulty concentrating, remembering, or making decisions?: No Patient able to express need for assistance with ADLs?: Yes Does the patient have difficulty dressing or bathing?: No Independently performs ADLs?: Yes (appropriate for developmental age) Does the patient have difficulty walking or climbing stairs?: No Weakness of Legs: None Weakness of Arms/Hands: None  Permission Sought/Granted Permission sought to share information with : Family Supports, Case Manager Permission granted to share information with : Yes, Verbal Permission Granted  Share Information with NAME: [name]  Permission granted to share info w AGENCY: Home Health  Permission granted to share info w Relationship: husband  Permission granted to share info w Contact Information: [phone]  Emotional Assessment Appearance:: Appears stated age   Affect (typically observed): Accepting Orientation: : Oriented to Self, Oriented to Place, Oriented to  Time, Oriented to Situation Alcohol / Substance Use: Not Applicable Psych Involvement: No (comment)  Admission diagnosis:  STEMI involving left anterior descending coronary artery (HCC) [I21.02] Ventricular septal defect as complication of acute myocardial infarction [facility]) [I23.2] Patient Active Problem List   Diagnosis Date Noted   Ventricular septal defect as complication of acute myocardial infarction (HCC) 05/11/2023   STEMI involving left anterior descending coronary artery (HCC) 05/04/2023   Diabetic ulcer of right foot associated with type 2 diabetes  mellitus, with necrosis of muscle (HCC) 04/04/2023   Cervical cancer screening 02/05/2023   Odynophagia 01/31/2023   Edema, peripheral 01/31/2023   Sleep disturbance 01/02/2023   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)  12/13/2022   Tinea of groin 09/30/2022   Need for immunization against influenza 09/30/2022   Aortic atherosclerosis (HCC) 06/29/2022   Type 2 diabetes mellitus (HCC) 06/29/2022   Acute deep vein thrombosis (DVT) of left lower extremity (HCC) 05/17/2022   Rhabdomyolysis 04/18/2022   Transaminitis 04/17/2022   Recurrent pneumonia 04/12/2022   Pulmonary embolism (HCC) 03/28/2022   Severe sepsis (HCC) 03/26/2022   UTI (urinary tract infection) 03/26/2022   Opioid dependence (HCC) 03/26/2022   Acute metabolic encephalopathy 03/26/2022   Hip pain 02/20/2022   Arthritis of right hip 02/19/2022   Hypothyroidism    AKI (acute kidney injury) (HCC)    Class 2 obesity    Depression    Essential hypertension    Acute cystitis without hematuria 01/04/2022   Dysfunction of left rotator cuff 01/04/2022   Lobar pneumonia (HCC) 01/04/2022   Acquired absence of other toe(s), unspecified side (HCC) 08/01/2021   Myositis of right lower extremity 07/01/2021   Tear of gluteus medius tendon 07/01/2021   Left wrist pain 06/01/2021   Medication monitoring encounter 05/10/2021   Chronic pain 03/24/2021   COVID-19 virus infection 11/01/2020   Stage 3a chronic kidney disease (HCC) 09/08/2019   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   Steroid-induced hyperglycemia 05/11/2018   Morbid obesity (HCC) 01/25/2018   Neuropathy 01/25/2018   Presence of intrathecal pump 08/02/2017   Rectocele 03/09/2017   Abnormal finding on imaging 12/19/2016   Esophageal dysphagia 12/19/2016   Current chronic use of systemic steroids 10/26/2016   IFG (impaired fasting glucose) 10/26/2016   Arthralgia of multiple joints 07/15/2016   Headache 07/15/2016   Peripheral neuropathy 07/04/2016   Rash of body 07/04/2016   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   Neuropathic pain of both feet 01/11/2016   OSA (obstructive sleep apnea) 12/29/2015   Chronic insomnia 12/29/2015   Obesity, Class II, BMI  35-39.9, with comorbidity 12/29/2015   Polyneuropathy associated with underlying disease (HCC) 12/29/2015   Class 2 drug-induced obesity with serious comorbidity and body mass index (BMI) of 35.0 to 35.9 in adult 12/29/2015   Other mechanical complication of implanted electronic neurostimulator of spinal cord electrode (lead), initial encounter (HCC) 08/04/2015   Adjustment disorder with depressed mood 03/29/2015   Mononeuritis 07/20/2014   Obesity (BMI 30-39.9) 07/20/2014   Neurocardiogenic syncope 07/06/2014   Normocytic anemia 06/08/2014   POTS (postural orthostatic tachycardia syndrome) 06/08/2014   Encounter for long-term (current) use of high-risk medication 05/30/2013   Fall 05/30/2013   Other long term (current) drug therapy 05/30/2013   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/30/2013   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/2013   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   Congenital syphilitic osteochondritis    GERD (gastroesophageal reflux disease) 03/07/2012   Other diseases of trachea and bronchus 08/30/2011   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   Atrophy of nasal turbinates 07/25/2011   Nasal septal perforation 07/25/2011   Cough 03/24/2011   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   Subglottic stenosis 03/24/2011   Granulomatosis with polyangiitis (HCC) 2009   PCP:  [name], [name] Pharmacy:   [facility] - [place], [place] - [address] [place] [address] Phone: [phone] Fax: [phone]     Social Determinants of Health (SDOH) Social History: SDOH Screenings   Food  Insecurity: No Food Insecurity (05/04/2023)  Housing: Patient Declined (05/04/2023)  Transportation Needs: No Transportation Needs (05/04/2023)  Utilities: Not At Risk (05/04/2023)  Depression (PHQ2-9): Low Risk  (04/03/2023)  Recent Concern: Depression (PHQ2-9) - Medium Risk (02/05/2023)  Financial Resource Strain: Medium Risk  (03/23/2023)   Received from [facility], [facility]  Physical Activity: Inactive (10/29/2021)   Received from [facility], [facility]  Social Connections: Unknown (02/19/2022)   Received from [facility], [facility]  Stress: Stress Concern Present (10/29/2021)   Received from [facility], [facility]  Tobacco Use: Low Risk  (05/11/2023)   SDOH Interventions:     Readmission Risk Interventions    04/18/2022    4:11 PM 04/05/2022    2:33 PM 03/27/2022   10:12 AM  Readmission Risk Prevention Plan  Transportation Screening Complete Complete Complete  Medication Review (RN Care Manager) Complete Complete Complete  PCP or Specialist appointment within 3-5 days of discharge Complete Complete   HRI or Home Care Consult Complete Complete Complete  SW Recovery Care/Counseling Consult Complete Complete Complete  Palliative Care Screening Not Applicable Not Applicable Not Applicable  Skilled Nursing Facility Not Applicable Not Applicable Not Applicable

## 2023-05-11 NOTE — Op Note (Signed)
[address]       [place] [address]             [phone]                                          05/11/2023 Patient:  [name] Pre-Op Dx: Post infarct VSD   Post-op Dx:  same Procedure: Patch closure of VSD   Surgeon and Role:      * Fontaine Kossman, Eliezer Lofts, MD - Primary    Ples Specter, MD - Co-surgeon   Anesthesia  general EBL:  1000 ml Blood Administration: See anesthesia record Xclamp Time:  88 min   Drains: 19 F blake drain: R, L,  25F mediastinal X 2  Wires: ventricular Counts: correct   Indications: 57yo female with post-infarct VSD. She had a DES placed to the LAD on 7/12, and is currently on brilinta. Echo was reviewed, and the VSD is very apical. She remains symptomatic with some shortness of breath, and chest pain. I would like to discuss with structural team to see if she is a candidate for a percutaneous closure device. If not, we would need to consider open repair, which will be difficult given her new stent. We also would need to consider options for mechanical support if she develops worsening LV failure after open repair.   Findings: Friable necrotic muscle along the septum.  Initial repair was unsuccessful in that the sutures pulled through.  The septum was reconstructed with a double layer felt patch.    Operative Technique: All invasive lines were placed in pre-op holding.  After the risks, benefits and alternatives were thoroughly discussed, the patient was brought to the operative theatre.  Anesthesia was induced, and the patient was prepped and draped in normal sterile fashion.  An appropriate surgical pause was performed, and pre-operative antibiotics were dosed accordingly.  We began with an incision along the chest for the sternotomy.  In regards to the sternotomy, this was carried down with bovie cautery, and the sternum was divided with a reciprocating saw.  Meticulous hemostasis was obtained.  The patient was  systemically heparinized.  The sternal elevator was removed, and a retractor was placed.  The pericardium was divided in the midline and fashioned into a cradle with pericardial stitches.   After we confirmed an appropriate ACT, the ascending aorta was cannulated in standard fashion.  Bicaval cannulation was performed in standard fashion. Cardiopulmonary bypass was initiated, and the cross clamp was applied, and a dose of anterograde cardioplegia was given with good arrest of the heart.  We made an incision along the apex of the heart in the left ventricle to expose the VSD.  It was evident in the septum.  We initially placed 2-0 pledgeted ethabond sutures in a mattressed fashion circumferentially along the edge of the defect, but when parachuting the felt patch down, several sutures pulled through.  At this point, we decided to reconstruct the septum with a large felt patch on the RV and LV side and further debridement of necrotic tissue.  The double layer felt was secured with 2-0 ethibond.  Another felt strip was placed on either side of the ventriculotomy, and it was closed, with interrupted 2-0 Ethibond.  The septal patch was incorporated in the closure.  The ventriculotomy was then oversewn with abother 2-0 ethibond surture.  Meticulous hemostasis was obtained.    We separated from cardiopulmonary bypass without event.  The heparin was reversed with protamine.  Chest tubes and wires were placed, and the sternum was re-approximated with sternal wires.  The soft tissue and skin were re-approximated wth absorbable suture.    The patient tolerated the procedure without any immediate complications, and was transferred to the ICU in guarded condition.  [name]

## 2023-05-11 NOTE — Brief Op Note (Signed)
05/11/2023  2:41 PM  PATIENT:  [name]  57 y.o. female  PRE-OPERATIVE DIAGNOSIS:  Ventricular Septal Defect  POST-OPERATIVE DIAGNOSIS:  Ventricular Septal Defect  PROCEDURES:  -VENTRICULAR SEPTAL DEFECT (VSD) REPAIR   -TRANSESOPHAGEAL ECHOCARDIOGRAM   SURGEON:  Surgeons and Role:    * Lightfoot, Eliezer Lofts, MD - Primary    * Lovett Sox, MD - Assisting  ASSISTANTS: Ria Bush, RN, Scrub Person         Tanda Rockers, RN, RN First Assistant   ANESTHESIA:   general  EBL:  950 mL   BLOOD ADMINISTERED:none  DRAINS:  Mediastinal drains    LOCAL MEDICATIONS USED:  NONE  COUNTS:  Corect  DICTATION: .Dragon Dictation  PLAN OF CARE: Admit to inpatient   PATIENT DISPOSITION:  ICU - intubated and hemodynamically stable.   Delay start of Pharmacological VTE agent (>24hrs) due to surgical blood loss or risk of bleeding: yes

## 2023-05-11 NOTE — Anesthesia Procedure Notes (Signed)
Central Venous Catheter Insertion Performed by: Val Eagle, MD, anesthesiologist Start/End7/19/2024 2:39 PM, 05/11/2023 2:39 PM Patient location: OR. Preanesthetic checklist: patient identified, IV checked, risks and benefits discussed, surgical consent, monitors and equipment checked, pre-op evaluation, timeout performed and anesthesia consent Position: supine Hand hygiene performed  and maximum sterile barriers used  Catheter size: 9 Fr Total catheter length 16. Central line was placed.Double lumen Procedure performed using ultrasound guided technique. Ultrasound Notes:anatomy identified, needle tip was noted to be adjacent to the nerve/plexus identified, no ultrasound evidence of intravascular and/or intraneural injection and image(s) printed for medical record Attempts: 1 Following insertion, dressing applied, line sutured and Biopatch. Post procedure assessment: blood return through all ports and free fluid flow  Patient tolerated the procedure well with no immediate complications.

## 2023-05-11 NOTE — Anesthesia Preprocedure Evaluation (Signed)
Anesthesia Evaluation  Patient identified by MRN, date of birth, ID band Patient awake    Reviewed: Allergy & Precautions, NPO status , Patient's Chart, lab work & pertinent test results  History of Anesthesia Complications Negative for: history of anesthetic complications  Airway Mallampati: III  TM Distance: >3 FB Neck ROM: Full    Dental  (+) Dental Advisory Given   Pulmonary shortness of breath, sleep apnea     + decreased breath sounds      Cardiovascular hypertension, + CAD, + Past MI and + Cardiac Stents   Rhythm:Regular  1. LV function is overall vigorous but there is apical akinesis with  evidence of an acute apical VSD with left to right flow. Left ventricular  ejection fraction, by estimation, is 60 to 65%. The left ventricle has  normal function. The left ventricle  demonstrates regional wall motion abnormalities (see scoring  diagram/findings for description). Left ventricular diastolic parameters  are indeterminate.   2. Right ventricular systolic function is normal. There is normal  pulmonary artery systolic pressure.   3. Left atrial size was mildly dilated.   4. Mild mitral valve regurgitation.   5. The aortic valve is tricuspid. Aortic valve regurgitation is not  visualized. Aortic valve sclerosis/calcification is present, without any  evidence of aortic stenosis.   6. The inferior vena cava is dilated in size with <50% respiratory  variability, suggesting right atrial pressure of 15 mmHg.     Neuro/Psych  Headaches PSYCHIATRIC DISORDERS  Depression     Neuromuscular disease    GI/Hepatic Neg liver ROS,GERD  ,,  Endo/Other  diabetes, Type 2Hypothyroidism  Lab Results      Component                Value               Date                      HGBA1C                   7.6 (H)             05/04/2023             Renal/GU ARFRenal diseaseLab Results      Component                Value               Date                       CREATININE               1.90 (H)            05/11/2023          \      Musculoskeletal  (+) Arthritis ,    Abdominal   Peds  Hematology  (+) Blood dyscrasia, anemia   Anesthesia Other Findings   Reproductive/Obstetrics                              Anesthesia Physical Anesthesia Plan  ASA: 5  Anesthesia Plan: General   Post-op Pain Management:    Induction: Intravenous  PONV Risk Score and Plan: 3 and Treatment may vary due to age or medical condition  Airway Management Planned: Oral ETT  Additional Equipment: Arterial line, CVP, TEE  and Ultrasound Guidance Line Placement  Intra-op Plan:   Post-operative Plan: Post-operative intubation/ventilation  Informed Consent: I have reviewed the patients History and Physical, chart, labs and discussed the procedure including the risks, benefits and alternatives for the proposed anesthesia with the patient or authorized representative who has indicated his/her understanding and acceptance.     Dental advisory given  Plan Discussed with: CRNA  Anesthesia Plan Comments:          Anesthesia Quick Evaluation

## 2023-05-11 NOTE — Anesthesia Postprocedure Evaluation (Signed)
Anesthesia Post Note  Patient: [name]  Procedure(s) Performed: VENTRICULAR SEPTAL DEFECT (VSD) REPAIR TRANSESOPHAGEAL ECHOCARDIOGRAM     Patient location during evaluation: SICU Anesthesia Type: General Level of consciousness: sedated Pain management: pain level controlled Vital Signs Assessment: post-procedure vital signs reviewed and stable Respiratory status: patient remains intubated per anesthesia plan Cardiovascular status: stable Postop Assessment: no apparent nausea or vomiting Anesthetic complications: no   No notable events documented.  Last Vitals:  Vitals:   05/11/23 0815 05/11/23 1436  BP:  (!) 101/51  Pulse:    Resp: 13 16  Temp:    SpO2: 96%     Last Pain:  Vitals:   05/11/23 0400  TempSrc: Core  PainSc: 0-No pain                 Marysa Wessner

## 2023-05-11 NOTE — Progress Notes (Signed)
     [address]       [place] [address]             [phone]       No events Vitals:   05/11/23 0400 05/11/23 0500  BP:    Pulse: 82 82  Resp: 17 19  Temp: 97.7 F (36.5 C) 98.1 F (36.7 C)  SpO2: 96% 97%   Alert NAD Sinus EWOB  OR today for VSD closure

## 2023-05-11 NOTE — Progress Notes (Signed)
Patient ID: [name], female   DOB: [birth year], 57 y.o.   MRN: [id number]    Advanced Heart Failure Rounding Note   Subjective:    7/14: RHC mean RA 21, PA 50/25 mean 35, mean PCWP 28, CI 1.8 F/2.6 T, QpQs 3.3.  IABP placed.   Remains on IABP at 1:1 + Milrinone 0.375. Co-ox 73%.   Continues to diurese w/ IV Lasix. Net negative 1787.  CVP 10-11 today.    Scr stable 1.69 -> 1.64 -> 1.56->1.70 -> 1.72 -> 1.76  [place]: CVP 11 PA (poor waveform) CI 2.4 Co-ox 73%  Feels ok this morning. No complaints. Denies dyspnea. Going to OR.    Objective:   Weight Range:  Vital Signs:   Temp:  [97.5 F (36.4 C)-99.3 F (37.4 C)] 98.1 F (36.7 C) (07/19 0500) Pulse Rate:  [62-94] 82 (07/19 0500) Resp:  [10-26] 19 (07/19 0500) BP: (107-111)/(62-64) 111/63 (07/18 1600) SpO2:  [91 %-97 %] 97 % (07/19 0500) Weight:  [98.9 kg] 98.9 kg (07/19 0556) Last BM Date : 05/10/23  Weight change: Filed Weights   05/09/23 0500 05/10/23 0500 05/11/23 0556  Weight: 99.9 kg 100.2 kg 98.9 kg    Intake/Output:   Intake/Output Summary (Last 24 hours) at 05/11/2023 0807 Last data filed at 05/11/2023 0500 Gross per 24 hour  Intake 2313.21 ml  Output 4135 ml  Net -1821.79 ml     Physical Exam: CVP 10-11 General: NAD Neck: JVP 10 cm, no thyromegaly or thyroid nodule.  Lungs: Clear to auscultation bilaterally with normal respiratory effort. CV: Nondisplaced PMI.  Heart regular S1/S2, no S3/S4, harsh systolic and diastolic murmur.  No peripheral edema.   Abdomen: Soft, nontender, no hepatosplenomegaly, no distention.  Skin: Intact without lesions or rashes.  Neurologic: Alert and oriented x 3.  Psych: Normal affect. Extremities: No clubbing or cyanosis.  HEENT: Normal.   Telemetry: SR 90s Personally reviewed  Labs: Basic Metabolic Panel: Recent Labs  Lab 05/05/23 0251 05/06/23 0255 05/06/23 1920 05/09/23 0452 05/09/23 1602 05/10/23 0357 05/10/23 1158 05/10/23 2038 05/10/23 2152  NA  133* 129*   < > 132* 129* 128* 124* 130* 126*  K 3.9 3.3*   < > 3.3* 3.2* 3.0* 4.0 2.9* 3.1*  CL 103 99   < > 90* 91* 90* 88* 87*  --   CO2 19* 20*   < > 25 25 26 23 28   --   GLUCOSE 252* 277*   < > 339* 357* 279* 394* 221*  --   BUN 19 22*   < > 23* 23* 23* 24* 23*  --   CREATININE 1.35* 1.45*   < > 1.70* 1.65* 1.72* 1.87* 1.76*  --   CALCIUM 8.7* 8.0*   < > 8.6* 8.4* 8.5* 8.5* 8.8*  --   MG 1.8 2.3  --   --   --   --   --  2.0  --    < > = values in this interval not displayed.    Liver Function Tests: No results for input(s): "AST", "ALT", "ALKPHOS", "BILITOT", "PROT", "ALBUMIN" in the last 168 hours.  No results for input(s): "LIPASE", "AMYLASE" in the last 168 hours. No results for input(s): "AMMONIA" in the last 168 hours.  CBC: Recent Labs  Lab 05/07/23 0416 05/08/23 0529 05/09/23 0452 05/10/23 0357 05/10/23 2152 05/11/23 0315  WBC 11.6* 10.6* 10.9* 12.1*  --  14.6*  HGB 9.6* 9.7* 10.3* 10.8* 11.2* 10.8*  HCT 29.9* 29.9* 31.6* 32.6* 33.0*  32.7*  MCV 90.1 87.9 85.9 87.4  --  87.2  PLT 226 220 223 242  --  251    Cardiac Enzymes: No results for input(s): "CKTOTAL", "CKMB", "CKMBINDEX", "TROPONINI" in the last 168 hours.  BNP: BNP (last 3 results) Recent Labs    05/04/23 0620  BNP 123.2*    ProBNP (last 3 results) No results for input(s): "PROBNP" in the last 8760 hours.    Other results:  Imaging: No results found.   Medications:     Scheduled Medications:  amitriptyline  50 mg Oral QHS   aspirin EC  81 mg Oral Daily   bisacodyl  5 mg Oral Once   Chlorhexidine Gluconate Cloth  6 each Topical Daily   digoxin  0.0625 mg Oral Daily   DULoxetine  60 mg Oral BID   epinephrine  0-10 mcg/min Intravenous To OR   furosemide  80 mg Intravenous BID   gabapentin  600 mg Oral TID   heparin sodium (porcine) 2,500 Units, papaverine 30 mg in electrolyte-A (PLASMALYTE-A PH 7.4) 500 mL irrigation   Irrigation To OR   insulin   Intravenous To OR   [place]  Blood Cardioplegia vial (lidocaine/magnesium/mannitol 0.26g-4g-6.4g)   Intracoronary To OR   levothyroxine  25 mcg Oral Daily   linaclotide  145 mcg Oral QAC breakfast   magnesium oxide  400 mg Oral Daily   melatonin  10 mg Oral QHS   mupirocin ointment  1 Application Nasal BID   pantoprazole  40 mg Oral BID AC   phenylephrine  30-200 mcg/min Intravenous To OR   potassium chloride  80 mEq Other To OR   rosuvastatin  40 mg Oral Daily   senna  2 tablet Oral BID   sodium chloride flush  10-40 mL Intracatheter Q12H   sodium chloride flush  3 mL Intravenous Q12H   sodium chloride flush  3 mL Intravenous Q12H   tapentadol  50 mg Oral TID   topiramate  100 mg Oral Daily   tranexamic acid  15 mg/kg Intravenous To OR   tranexamic acid  2 mg/kg Intracatheter To OR    Infusions:  sodium chloride 10 mL/hr at 05/11/23 0500   sodium chloride Stopped (05/04/23 1630)   sodium chloride     sodium chloride     amiodarone 30 mg/hr (05/11/23 0500)    ceFAZolin (ANCEF) IV      ceFAZolin (ANCEF) IV     dexmedetomidine     heparin 30,000 units/NS 1000 mL solution for CELLSAVER     heparin 1,150 Units/hr (05/11/23 0500)   insulin 3 Units/hr (05/11/23 0500)   milrinone 0.375 mcg/kg/min (05/11/23 0500)   milrinone     nitroGLYCERIN     norepinephrine (LEVOPHED) Adult infusion Stopped (05/07/23 0935)   norepinephrine     tranexamic acid (CYKLOKAPRON) 2,500 mg in sodium chloride 0.9 % 250 mL (10 mg/mL) infusion     vancomycin     vasopressin      PRN Medications: sodium chloride, sodium chloride, acetaminophen, albuterol, dextrose, HYDROcodone-acetaminophen, lidocaine, methocarbamol, morphine injection, ondansetron (ZOFRAN) IV, mouth rinse, sodium chloride flush, sodium chloride flush, sodium chloride flush    Patient Profile    [name] is a 57 y.o. female with osteomyelitis, neurocardiogenic syncope , wegener's disease, chronic pain, DVT and PE 6/23 s/p IVC filter + eliquis, obesity and  diabetes. Admitted with STEMI s/p PDI to LAD now in cardiogenic shock.    Assessment/Plan:   Cardiogenic shock -> acute HF with  mildly-reduced EF complicated by apical VSD.  - Echo 6/23 EF 55%, no RWMA, normal RV, trivial MR - Echo 7/12,  EF 45% - Echo 7/14: EF 60% with apical VSD, normal RV. Apical VSD creates large shunt by RHC QpQs 3.3.  - IABP 1:1 on heparin gtt with milrinone 0.375.  CI 2.4 this morning. Co-ox 73%  - Heparin gtt for IABP.  - CVP 10-11 today with excellent diuresis yesterday.   - Hold Lasix today for OR.   - Continue digoxin 0.0625  - TEE 7/15 showed EF 55% with apical akinesis and apical VSD with significant L>R flow, RV mildly dilated and dysfunctional.  VSD will need definitive treatment.  Have discussed with Drs Cliffton Asters and Lynnette Caffey, will have surgical VSD repair today.  Brilinta has been stopped and she is on Cangrelor for fresh stent, holding for OR. Will need to restart P2Y12 inhibition as soon as possible post-op.    Anterior STEMI, CAD - HsTrop >24K on admission - s/p PCI to LAD 7/12, post DES 0% residual stenosis. Medical management of 40% stenosed RCA.  - plan for DAPT with ASA and Ticagrelor x12 months (covering w/ Cangrelor for now) - Continue statin, LDL unable to be calculated with high TGD, continue Crestor 40.  - Complicated by infarct-related VSD.   3. Post-MI chest pain -possible pericarditis - No longer reporting pericardial-type pain.    DM II - A1c last 7.6 3/24, update - SSI while admitted per primary - has diabetic foot ulcer, WOC following - ABI 12/22 were normal   Obesity - Body mass index is 37.76 kg/m.  - consider GLP-1 as OP   OSA - does not use CPAP   HLD - Continue statin  7. AKI - Stable creatinine today at 1.76  8. Hyponatremia - Na 130, hypervolemic hyponatremia. Fluid restrict.     CRITICAL CARE Performed by: Marca Ancona  Total critical care time: 35 minutes  Critical care time was exclusive of  separately billable procedures and treating other patients.  Critical care was necessary to treat or prevent imminent or life-threatening deterioration.  Critical care was time spent personally by me on the following activities: development of treatment plan with patient and/or surrogate as well as nursing, discussions with consultants, evaluation of patient's response to treatment, examination of patient, obtaining history from patient or surrogate, ordering and performing treatments and interventions, ordering and review of laboratory studies, ordering and review of radiographic studies, pulse oximetry and re-evaluation of patient's condition.  Marca Ancona 05/11/2023 8:07 AM

## 2023-05-12 ENCOUNTER — Inpatient Hospital Stay (HOSPITAL_COMMUNITY): Payer: BC Managed Care – PPO

## 2023-05-12 ENCOUNTER — Encounter (HOSPITAL_COMMUNITY): Payer: Self-pay | Admitting: Thoracic Surgery (Cardiothoracic Vascular Surgery)

## 2023-05-12 DIAGNOSIS — I2109 ST elevation (STEMI) myocardial infarction involving other coronary artery of anterior wall: Secondary | ICD-10-CM | POA: Diagnosis not present

## 2023-05-12 DIAGNOSIS — I232 Ventricular septal defect as current complication following acute myocardial infarction: Secondary | ICD-10-CM | POA: Diagnosis not present

## 2023-05-12 DIAGNOSIS — Z1152 Encounter for screening for COVID-19: Secondary | ICD-10-CM | POA: Diagnosis not present

## 2023-05-12 DIAGNOSIS — I2102 ST elevation (STEMI) myocardial infarction involving left anterior descending coronary artery: Secondary | ICD-10-CM | POA: Diagnosis not present

## 2023-05-12 DIAGNOSIS — J9601 Acute respiratory failure with hypoxia: Secondary | ICD-10-CM | POA: Diagnosis not present

## 2023-05-12 DIAGNOSIS — R570 Cardiogenic shock: Secondary | ICD-10-CM | POA: Diagnosis not present

## 2023-05-12 DIAGNOSIS — I5023 Acute on chronic systolic (congestive) heart failure: Secondary | ICD-10-CM | POA: Diagnosis not present

## 2023-05-12 LAB — POCT I-STAT 7, (LYTES, BLD GAS, ICA,H+H)
Acid-Base Excess: 1 mmol/L (ref 0.0–2.0)
Acid-Base Excess: 3 mmol/L — ABNORMAL HIGH (ref 0.0–2.0)
Acid-base deficit: 2 mmol/L (ref 0.0–2.0)
Acid-base deficit: 3 mmol/L — ABNORMAL HIGH (ref 0.0–2.0)
Acid-base deficit: 4 mmol/L — ABNORMAL HIGH (ref 0.0–2.0)
Acid-base deficit: 5 mmol/L — ABNORMAL HIGH (ref 0.0–2.0)
Acid-base deficit: 7 mmol/L — ABNORMAL HIGH (ref 0.0–2.0)
Bicarbonate: 19.8 mmol/L — ABNORMAL LOW (ref 20.0–28.0)
Bicarbonate: 21.8 mmol/L (ref 20.0–28.0)
Bicarbonate: 22.2 mmol/L (ref 20.0–28.0)
Bicarbonate: 23.1 mmol/L (ref 20.0–28.0)
Bicarbonate: 23.4 mmol/L (ref 20.0–28.0)
Bicarbonate: 26.1 mmol/L (ref 20.0–28.0)
Bicarbonate: 27.3 mmol/L (ref 20.0–28.0)
Calcium, Ion: 0.93 mmol/L — ABNORMAL LOW (ref 1.15–1.40)
Calcium, Ion: 1.04 mmol/L — ABNORMAL LOW (ref 1.15–1.40)
Calcium, Ion: 1.05 mmol/L — ABNORMAL LOW (ref 1.15–1.40)
Calcium, Ion: 1.06 mmol/L — ABNORMAL LOW (ref 1.15–1.40)
Calcium, Ion: 1.09 mmol/L — ABNORMAL LOW (ref 1.15–1.40)
Calcium, Ion: 1.09 mmol/L — ABNORMAL LOW (ref 1.15–1.40)
Calcium, Ion: 1.1 mmol/L — ABNORMAL LOW (ref 1.15–1.40)
HCT: 20 % — ABNORMAL LOW (ref 36.0–46.0)
HCT: 20 % — ABNORMAL LOW (ref 36.0–46.0)
HCT: 23 % — ABNORMAL LOW (ref 36.0–46.0)
HCT: 31 % — ABNORMAL LOW (ref 36.0–46.0)
HCT: 33 % — ABNORMAL LOW (ref 36.0–46.0)
HCT: 33 % — ABNORMAL LOW (ref 36.0–46.0)
HCT: 43 % (ref 36.0–46.0)
Hemoglobin: 10.5 g/dL — ABNORMAL LOW (ref 12.0–15.0)
Hemoglobin: 11.2 g/dL — ABNORMAL LOW (ref 12.0–15.0)
Hemoglobin: 11.2 g/dL — ABNORMAL LOW (ref 12.0–15.0)
Hemoglobin: 14.6 g/dL (ref 12.0–15.0)
Hemoglobin: 6.8 g/dL — CL (ref 12.0–15.0)
Hemoglobin: 6.8 g/dL — CL (ref 12.0–15.0)
Hemoglobin: 7.8 g/dL — ABNORMAL LOW (ref 12.0–15.0)
O2 Saturation: 100 %
O2 Saturation: 91 %
O2 Saturation: 93 %
O2 Saturation: 93 %
O2 Saturation: 97 %
O2 Saturation: 97 %
O2 Saturation: 99 %
Patient temperature: 36.5
Patient temperature: 37.4
Patient temperature: 37.6
Patient temperature: 37.8
Potassium: 3.6 mmol/L (ref 3.5–5.1)
Potassium: 3.8 mmol/L (ref 3.5–5.1)
Potassium: 3.8 mmol/L (ref 3.5–5.1)
Potassium: 3.9 mmol/L (ref 3.5–5.1)
Potassium: 4.2 mmol/L (ref 3.5–5.1)
Potassium: 4.8 mmol/L (ref 3.5–5.1)
Potassium: 5.1 mmol/L (ref 3.5–5.1)
Sodium: 127 mmol/L — ABNORMAL LOW (ref 135–145)
Sodium: 129 mmol/L — ABNORMAL LOW (ref 135–145)
Sodium: 129 mmol/L — ABNORMAL LOW (ref 135–145)
Sodium: 131 mmol/L — ABNORMAL LOW (ref 135–145)
Sodium: 131 mmol/L — ABNORMAL LOW (ref 135–145)
Sodium: 131 mmol/L — ABNORMAL LOW (ref 135–145)
Sodium: 132 mmol/L — ABNORMAL LOW (ref 135–145)
TCO2: 21 mmol/L — ABNORMAL LOW (ref 22–32)
TCO2: 23 mmol/L (ref 22–32)
TCO2: 24 mmol/L (ref 22–32)
TCO2: 24 mmol/L (ref 22–32)
TCO2: 25 mmol/L (ref 22–32)
TCO2: 27 mmol/L (ref 22–32)
TCO2: 29 mmol/L (ref 22–32)
pCO2 arterial: 40.7 mmHg (ref 32–48)
pCO2 arterial: 41.8 mmHg (ref 32–48)
pCO2 arterial: 41.9 mmHg (ref 32–48)
pCO2 arterial: 41.9 mmHg (ref 32–48)
pCO2 arterial: 42.7 mmHg (ref 32–48)
pCO2 arterial: 44.4 mmHg (ref 32–48)
pCO2 arterial: 47.8 mmHg (ref 32–48)
pH, Arterial: 7.26 — ABNORMAL LOW (ref 7.35–7.45)
pH, Arterial: 7.272 — ABNORMAL LOW (ref 7.35–7.45)
pH, Arterial: 7.339 — ABNORMAL LOW (ref 7.35–7.45)
pH, Arterial: 7.342 — ABNORMAL LOW (ref 7.35–7.45)
pH, Arterial: 7.358 (ref 7.35–7.45)
pH, Arterial: 7.402 (ref 7.35–7.45)
pH, Arterial: 7.424 (ref 7.35–7.45)
pO2, Arterial: 126 mmHg — ABNORMAL HIGH (ref 83–108)
pO2, Arterial: 323 mmHg — ABNORMAL HIGH (ref 83–108)
pO2, Arterial: 69 mmHg — ABNORMAL LOW (ref 83–108)
pO2, Arterial: 70 mmHg — ABNORMAL LOW (ref 83–108)
pO2, Arterial: 77 mmHg — ABNORMAL LOW (ref 83–108)
pO2, Arterial: 95 mmHg (ref 83–108)
pO2, Arterial: 98 mmHg (ref 83–108)

## 2023-05-12 LAB — BPAM RBC
Blood Product Expiration Date: 202408202359
Blood Product Expiration Date: 202408202359
Blood Product Expiration Date: 202408202359
Blood Product Expiration Date: 202408232359
Blood Product Expiration Date: 202408232359
ISSUE DATE / TIME: 202407191130
ISSUE DATE / TIME: 202407200849
Unit Type and Rh: 5100
Unit Type and Rh: 5100

## 2023-05-12 LAB — TYPE AND SCREEN: Unit division: 0

## 2023-05-12 LAB — COOXEMETRY PANEL
Carboxyhemoglobin: 0.6 % (ref 0.5–1.5)
Carboxyhemoglobin: 0.9 % (ref 0.5–1.5)
Methemoglobin: 2 % — ABNORMAL HIGH (ref 0.0–1.5)
Methemoglobin: 1 % (ref 0.0–1.5)
O2 Saturation: 60.8 %
O2 Saturation: 60.3 %
Total hemoglobin: 7 g/dL — ABNORMAL LOW (ref 12.0–16.0)
Total hemoglobin: 8.4 g/dL — ABNORMAL LOW (ref 12.0–16.0)

## 2023-05-12 LAB — CBC
HCT: 21.1 % — ABNORMAL LOW (ref 36.0–46.0)
HCT: 25.4 % — ABNORMAL LOW (ref 36.0–46.0)
Hemoglobin: 7 g/dL — ABNORMAL LOW (ref 12.0–15.0)
Hemoglobin: 8.2 g/dL — ABNORMAL LOW (ref 12.0–15.0)
MCH: 28.9 pg (ref 26.0–34.0)
MCH: 28.4 pg (ref 26.0–34.0)
MCHC: 33.2 g/dL (ref 30.0–36.0)
MCHC: 32.3 g/dL (ref 30.0–36.0)
MCV: 87.2 fL (ref 80.0–100.0)
MCV: 87.9 fL (ref 80.0–100.0)
Platelets: 93 10*3/uL — ABNORMAL LOW (ref 150–400)
Platelets: 103 10*3/uL — ABNORMAL LOW (ref 150–400)
RBC: 2.42 MIL/uL — ABNORMAL LOW (ref 3.87–5.11)
RBC: 2.89 MIL/uL — ABNORMAL LOW (ref 3.87–5.11)
RDW: 15.2 % (ref 11.5–15.5)
RDW: 15.3 % (ref 11.5–15.5)
WBC: 10.8 10*3/uL — ABNORMAL HIGH (ref 4.0–10.5)
WBC: 16.5 10*3/uL — ABNORMAL HIGH (ref 4.0–10.5)
nRBC: 0.7 % — ABNORMAL HIGH (ref 0.0–0.2)
nRBC: 0.2 % (ref 0.0–0.2)

## 2023-05-12 LAB — POCT I-STAT, CHEM 8
BUN: 24 mg/dL — ABNORMAL HIGH (ref 6–20)
BUN: 21 mg/dL — ABNORMAL HIGH (ref 6–20)
BUN: 22 mg/dL — ABNORMAL HIGH (ref 6–20)
Calcium, Ion: 0.94 mmol/L — ABNORMAL LOW (ref 1.15–1.40)
Calcium, Ion: 0.82 mmol/L — CL (ref 1.15–1.40)
Calcium, Ion: 1.1 mmol/L — ABNORMAL LOW (ref 1.15–1.40)
Chloride: 92 mmol/L — ABNORMAL LOW (ref 98–111)
Chloride: 90 mmol/L — ABNORMAL LOW (ref 98–111)
Chloride: 91 mmol/L — ABNORMAL LOW (ref 98–111)
Creatinine, Ser: 1.6 mg/dL — ABNORMAL HIGH (ref 0.44–1.00)
Creatinine, Ser: 1.5 mg/dL — ABNORMAL HIGH (ref 0.44–1.00)
Creatinine, Ser: 1.4 mg/dL — ABNORMAL HIGH (ref 0.44–1.00)
Glucose, Bld: 192 mg/dL — ABNORMAL HIGH (ref 70–99)
Glucose, Bld: 212 mg/dL — ABNORMAL HIGH (ref 70–99)
Glucose, Bld: 258 mg/dL — ABNORMAL HIGH (ref 70–99)
HCT: 16 % — ABNORMAL LOW (ref 36.0–46.0)
HCT: 24 % — ABNORMAL LOW (ref 36.0–46.0)
HCT: 32 % — ABNORMAL LOW (ref 36.0–46.0)
Hemoglobin: 5.4 g/dL — CL (ref 12.0–15.0)
Hemoglobin: 8.2 g/dL — ABNORMAL LOW (ref 12.0–15.0)
Hemoglobin: 10.9 g/dL — ABNORMAL LOW (ref 12.0–15.0)
Potassium: 4.9 mmol/L (ref 3.5–5.1)
Potassium: 4.7 mmol/L (ref 3.5–5.1)
Potassium: 3.7 mmol/L (ref 3.5–5.1)
Sodium: 127 mmol/L — ABNORMAL LOW (ref 135–145)
Sodium: 129 mmol/L — ABNORMAL LOW (ref 135–145)
Sodium: 130 mmol/L — ABNORMAL LOW (ref 135–145)
TCO2: 29 mmol/L (ref 22–32)
TCO2: 28 mmol/L (ref 22–32)
TCO2: 20 mmol/L — ABNORMAL LOW (ref 22–32)

## 2023-05-12 LAB — BASIC METABOLIC PANEL
Anion gap: 11 (ref 5–15)
Anion gap: 14 (ref 5–15)
BUN: 17 mg/dL (ref 6–20)
BUN: 15 mg/dL (ref 6–20)
CO2: 25 mmol/L (ref 22–32)
CO2: 25 mmol/L (ref 22–32)
Calcium: 7.4 mg/dL — ABNORMAL LOW (ref 8.9–10.3)
Calcium: 7.5 mg/dL — ABNORMAL LOW (ref 8.9–10.3)
Chloride: 95 mmol/L — ABNORMAL LOW (ref 98–111)
Chloride: 91 mmol/L — ABNORMAL LOW (ref 98–111)
Creatinine, Ser: 1.63 mg/dL — ABNORMAL HIGH (ref 0.44–1.00)
Creatinine, Ser: 1.59 mg/dL — ABNORMAL HIGH (ref 0.44–1.00)
GFR, Estimated: 37 mL/min — ABNORMAL LOW (ref >60)
GFR, Estimated: 38 mL/min — ABNORMAL LOW (ref >60)
Glucose, Bld: 191 mg/dL — ABNORMAL HIGH (ref 70–99)
Glucose, Bld: 151 mg/dL — ABNORMAL HIGH (ref 70–99)
Potassium: 3.8 mmol/L (ref 3.5–5.1)
Potassium: 3.2 mmol/L — ABNORMAL LOW (ref 3.5–5.1)
Sodium: 131 mmol/L — ABNORMAL LOW (ref 135–145)
Sodium: 130 mmol/L — ABNORMAL LOW (ref 135–145)

## 2023-05-12 LAB — MAGNESIUM
Magnesium: 2.9 mg/dL — ABNORMAL HIGH (ref 1.7–2.4)
Magnesium: 2.3 mg/dL (ref 1.7–2.4)

## 2023-05-12 LAB — GLUCOSE, CAPILLARY
Glucose-Capillary: 124 mg/dL — ABNORMAL HIGH (ref 70–99)
Glucose-Capillary: 130 mg/dL — ABNORMAL HIGH (ref 70–99)
Glucose-Capillary: 133 mg/dL — ABNORMAL HIGH (ref 70–99)
Glucose-Capillary: 135 mg/dL — ABNORMAL HIGH (ref 70–99)
Glucose-Capillary: 136 mg/dL — ABNORMAL HIGH (ref 70–99)
Glucose-Capillary: 137 mg/dL — ABNORMAL HIGH (ref 70–99)
Glucose-Capillary: 138 mg/dL — ABNORMAL HIGH (ref 70–99)
Glucose-Capillary: 138 mg/dL — ABNORMAL HIGH (ref 70–99)
Glucose-Capillary: 139 mg/dL — ABNORMAL HIGH (ref 70–99)
Glucose-Capillary: 140 mg/dL — ABNORMAL HIGH (ref 70–99)
Glucose-Capillary: 141 mg/dL — ABNORMAL HIGH (ref 70–99)
Glucose-Capillary: 146 mg/dL — ABNORMAL HIGH (ref 70–99)
Glucose-Capillary: 152 mg/dL — ABNORMAL HIGH (ref 70–99)
Glucose-Capillary: 152 mg/dL — ABNORMAL HIGH (ref 70–99)
Glucose-Capillary: 157 mg/dL — ABNORMAL HIGH (ref 70–99)
Glucose-Capillary: 161 mg/dL — ABNORMAL HIGH (ref 70–99)
Glucose-Capillary: 170 mg/dL — ABNORMAL HIGH (ref 70–99)
Glucose-Capillary: 194 mg/dL — ABNORMAL HIGH (ref 70–99)
Glucose-Capillary: 235 mg/dL — ABNORMAL HIGH (ref 70–99)
Glucose-Capillary: 277 mg/dL — ABNORMAL HIGH (ref 70–99)
Glucose-Capillary: 278 mg/dL — ABNORMAL HIGH (ref 70–99)

## 2023-05-12 LAB — PREPARE RBC (CROSSMATCH)

## 2023-05-12 MED ORDER — ORAL CARE MOUTH RINSE
15.0000 mL | OROMUCOSAL | Status: DC
  Administered 2023-05-12 – 2023-05-28 (×51): 15 mL via OROMUCOSAL

## 2023-05-12 MED ORDER — FUROSEMIDE 10 MG/ML IJ SOLN
40.0000 mg | Freq: Two times a day (BID) | INTRAMUSCULAR | Status: DC
  Administered 2023-05-12 (×2): 40 mg via INTRAVENOUS
  Filled 2023-05-12: qty 4

## 2023-05-12 MED ORDER — ORAL CARE MOUTH RINSE: 15.0000 mL | OROMUCOSAL | Status: DC | PRN

## 2023-05-12 MED ORDER — POTASSIUM CHLORIDE 10 MEQ/50ML IV SOLN
10.0000 meq | INTRAVENOUS | Status: AC
  Administered 2023-05-12 (×3): 10 meq via INTRAVENOUS
  Filled 2023-05-12: qty 50

## 2023-05-12 MED ORDER — SODIUM CHLORIDE 0.9% IV SOLUTION
Freq: Once | INTRAVENOUS | Status: AC
  Administered 2023-05-12: 1 mL via INTRAVENOUS

## 2023-05-12 MED ORDER — NOREPINEPHRINE 16 MG/250ML-% IV SOLN
0.0000 ug/min | INTRAVENOUS | Status: DC
  Administered 2023-05-12 – 2023-05-13 (×2): 10 ug/min via INTRAVENOUS
  Administered 2023-05-15: 3 ug/min via INTRAVENOUS
  Filled 2023-05-12 (×3): qty 250

## 2023-05-12 NOTE — Progress Notes (Signed)
[address]       [place] [id number]             [phone]      1 Day Post-Op  Procedure(s) (LRB): VENTRICULAR SEPTAL DEFECT (VSD) REPAIR (N/A) TRANSESOPHAGEAL ECHOCARDIOGRAM (N/A)   Total Length of Stay:  LOS: 8 days    SUBJECTIVE: Relatively stable overnight after volume load and addition of vasopressin Bleeding resolved Vitals:   05/12/23 0800 05/12/23 0813  BP: (!) 145/69   Pulse: 71 72  Resp:    Temp: 100 F (37.8 C) 100 F (37.8 C)  SpO2: 99% 99%    Intake/Output      07/19 0701 07/20 0700 07/20 0701 07/21 0700   P.O.     I.V. (mL/kg) 4322.5 (39.6)    Blood 450    NG/GT 60    IV Piggyback 2460    Total Intake(mL/kg) 7292.5 (66.8)    Urine (mL/kg/hr) 2470 (0.9)    Emesis/NG output 400    Drains 0    Stool     Blood 950    Chest Tube 1190    Total Output 5010    Net +2282.5             sodium chloride     sodium chloride     sodium chloride     amiodarone 30 mg/hr (05/12/23 0700)   dexmedetomidine (PRECEDEX) IV infusion 1.4 mcg/kg/hr (05/12/23 0813)   DOBUTamine 2.5 mcg/kg/min (05/12/23 0700)   epinephrine Stopped (05/12/23 0138)   insulin 7.5 Units/hr (05/12/23 0700)   lactated ringers 20 mL/hr at 05/12/23 0700   lactated ringers 20 mL/hr at 05/12/23 0700   levofloxacin (LEVAQUIN) IV     milrinone 0.375 mcg/kg/min (05/12/23 0700)   niCARDipine     norepinephrine (LEVOPHED) Adult infusion     vasopressin 0.04 Units/min (05/12/23 0700)    CBC    Component Value Date/Time   WBC 10.8 (H) 05/12/2023 0425   RBC 2.42 (L) 05/12/2023 0425   HGB 7.0 (L) 05/12/2023 0425   HGB 12.7 01/04/2023 1312   HCT 21.1 (L) 05/12/2023 0425   HCT 39.6 01/04/2023 1312   PLT 93 (L) 05/12/2023 0425   PLT 269 01/04/2023 1312   MCV 87.2 05/12/2023 0425   MCV 93 01/04/2023 1312   MCH 28.9 05/12/2023 0425   MCHC 33.2 05/12/2023 0425   RDW 15.2 05/12/2023 0425   RDW 14.3 01/04/2023 1312   LYMPHSABS 6.0 (H) 05/04/2023 0612    LYMPHSABS 4.2 (H) 01/04/2023 1312   MONOABS 1.1 (H) 05/04/2023 0612   EOSABS 0.4 05/04/2023 0612   EOSABS 0.4 01/04/2023 1312   BASOSABS 0.1 05/04/2023 0612   BASOSABS 0.1 01/04/2023 1312   CMP     Component Value Date/Time   NA 132 (L) 05/12/2023 0420   NA 141 01/04/2023 1312   K 3.8 05/12/2023 0420   CL 95 (L) 05/12/2023 0317   CO2 25 05/12/2023 0317   GLUCOSE 191 (H) 05/12/2023 0317   BUN 17 05/12/2023 0317   BUN 14 01/04/2023 1312   CREATININE 1.63 (H) 05/12/2023 0317   CREATININE 1.07 (H) 07/01/2021 0909   CALCIUM 7.4 (L) 05/12/2023 0317   PROT 4.3 (L) 05/11/2023 1936   PROT 5.9 (L) 01/04/2023 1312   ALBUMIN 2.8 (L) 05/11/2023 1936   ALBUMIN 4.1 01/04/2023 1312   AST 51 (H) 05/11/2023 1936   ALT 15 05/11/2023 1936   ALKPHOS 64 05/11/2023 1936   BILITOT 0.5  05/11/2023 1936   BILITOT 0.3 01/04/2023 1312   GFRNONAA 37 (L) 05/12/2023 0317   GFRAA 30 (L) 01/12/2017 1208   ABG    Component Value Date/Time   PHART 7.358 05/12/2023 0420   PCO2ART 41.9 05/12/2023 0420   PO2ART 98 05/12/2023 0420   HCO3 23.4 05/12/2023 0420   TCO2 25 05/12/2023 0420   ACIDBASEDEF 2.0 05/12/2023 0420   O2SAT 60.8 05/12/2023 0425   CBG (last 3)  Recent Labs    05/12/23 0526 05/12/23 0619 05/12/23 0700  GLUCAP 139* 133* 124*  EXAM Sedated but opening eyes Lungs: decreased bilaterally Card: RR Ext: warm and dry    ASSESSMENT: POD #1 SP VSD repair Hemodynamics ok on IABP, Levo, milrinone, vasopressin, and dobutamine. AHF involved and will see if can start weaning drips vs IABP Pulm: CXR with evidence of CHF. Will start diuresis   Attempt to wean to extubate Heme: blood loss anemia. Will transfuse 1 unit of PRBC after response to lasix and another dose after blood administered. Will need plavix for stent and consider NOAC vs just asa  Continue amiodorone infusion    Eugenio Hoes, MD 05/12/2023

## 2023-05-12 NOTE — Progress Notes (Signed)
NAME:  Truda Staub, MRN:  [id number], DOB:  1966/03/20, LOS: 8 ADMISSION DATE:  05/04/2023, CONSULTATION DATE: 05/11/2023 REFERRING MD: Dr. Cliffton Asters, CHIEF COMPLAINT: Chest pain  History of Present Illness:  57 year old female with a diabetes melitis, complicated with neuropathy and Wegener's granulomatosis who was initially admitted with acute anterior STEMI underwent PCI to LAD, who continues to require vasopressor support and IABP, echocardiogram showed mechanical complication including apical VSD, underwent VSD repair, remained intubated, on multiple vasopressor support, PCCM was consulted for help evaluation medical management  Pertinent  Medical History   Past Medical History:  Diagnosis Date   Chronic cough    Chronic pain    Diabetes mellitus without complication (HCC)    Dyspnea    GERD (gastroesophageal reflux disease)    Hypokalemia    Hypothyroidism    Left wrist pain 06/01/2021   Myonecrosis (HCC) 07/01/2021   Neurocardiogenic syncope    OSA (obstructive sleep apnea)    does not use CPAP   Osteomyelitis (HCC)    bilateral feet   Peripheral neuropathy    Presence of intrathecal pump    recieves Prialt/bupivicaine   Tear of gluteus medius tendon 07/01/2021   Wegener's granulomatosis 2009   Wegner's disease (congenital syphilitic osteochondritis)      Significant Hospital Events: Including procedures, antibiotic start and stop dates in addition to other pertinent events     Interim History / Subjective:  Patient remains on multiple vasopressors and inotropes, currently on milrinone/dobutamine/norepinephrine/vasopressin Epinephrine was titrated off Remains on IABP at 1: 1 Failed rapid weaning trial due to hypoxia  Objective   Blood pressure (!) 145/69, pulse 72, temperature 100 F (37.8 C), resp. rate 16, height 5\' 4"  (1.626 m), weight 109.2 kg, SpO2 99%. PAP: (20-38)/(-4-22) 24/21 CVP:  [6 mmHg-19 mmHg] 9 mmHg CO:  [2.2 L/min-5.7 L/min] 5.7 L/min CI:  [1.3  L/min/m2-2.7 L/min/m2] 2.7 L/min/m2  Vent Mode: SIMV;PSV;PRVC FiO2 (%):  [50 %-60 %] 60 % Set Rate:  [16 bmp-20 bmp] 20 bmp Vt Set:  [430 mL] 430 mL PEEP:  [5 cmH20] 5 cmH20 Pressure Support:  [10 cmH20] 10 cmH20 Plateau Pressure:  [20 cmH20-29 cmH20] 29 cmH20   Intake/Output Summary (Last 24 hours) at 05/12/2023 0906 Last data filed at 05/12/2023 0800 Gross per 24 hour  Intake 6939.7 ml  Output 5050 ml  Net 1889.7 ml   Filed Weights   05/10/23 0500 05/11/23 0556 05/12/23 0600  Weight: 100.2 kg 98.9 kg 109.2 kg    Examination: General: Crtitically ill-appearing morbidly obese female, orally intubated HEENT: [place]/AT, eyes anicteric.  ETT and OGT in place Neuro: Opens eyes with vocal stimuli, intermittently following commands Chest: Bilateral basal crackles, no wheezes or rhonchi Heart: Regular rate and rhythm, no murmurs or gallops Abdomen: Soft, nondistended, bowel sounds present Skin: No rash  Labs and images were reviewed Coox 61%, Na 132, Cr 1.63 Hb 7.0, PLT 93  Resolved Hospital Problem list     Assessment & Plan:  Coronary artery disease, presented with acute anterior STEMI, complicated with apical VSD. Apical VSD s/p surgical repair S/p PCI to LAD with DES  Continue aspirin and statin Will need to be started on Plavix by tomorrow Holding Brilinta Chest tube management TCTS Continue to titrate Precedex with RASS goal 0/-1 Continue pain control with tramadol, oxycodone and morphine Closely monitor chest tube output  Acute HFrEF with cardiogenic shock Patient ejection fraction is 45% Epinephrine was titrated off Remain on dobutamine, norepinephrine, vasopressin and milrinone, continue to titrate with  MAP goal 65 On IABP 1:1, it will be titrated down to 1: 2 Advanced heart failure team is following Will start diuresis Started on digoxin  Acute respiratory failure with hypoxia, due to pulmonary edema Continue on protective ventilation VAP prevention bundle in  place Patient failed rapid weaning trial due to hypoxia, remains on FiO2 60% Titrate FiO2 with O2 sat goal 92% Will give diuresis Will try to extubate her later  Hyperlipidemia Continue Crestor  Acute kidney injury due to ischemic ATN Hyponatremia Serum creatinine improved from 1.9 down to 1.6 Monitor intake and output Avoid nephrotoxic agent Closely monitor electrolytes Serum sodium remained at 132  Diabetes type 2 Patient hemoglobin A1c is 7.6 Currently on insulin infusion, monitor fingerstick with goal 140-180 Will switch back to long-acting insulin sliding scale later today after extubation  Expected perioperative blood loss anemia 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 Patient was on Plavix, slightly oozing, closely monitor chest tube output Platelet count dropped to 93 and hemoglobin at 7.0 with hematocrit 21 She will receive 1 unit PRBC   Best Practice (right click and "Reselect all SmartList Selections" daily)   Diet/type: NPO DVT prophylaxis: SCD GI prophylaxis: PPI Lines: Central line, Arterial Line, and yes and it is still needed Foley:  Yes, and it is still needed Code Status:  full code Last date of multidisciplinary goals of care discussion [Per primary team]  Labs   CBC: Recent Labs  Lab 05/10/23 0357 05/10/23 2152 05/11/23 0315 05/11/23 0908 05/11/23 1243 05/11/23 1441 05/11/23 1444 05/11/23 1936 05/11/23 1938 05/11/23 2115 05/12/23 0008 05/12/23 0420 05/12/23 0425  WBC 12.1*  --  14.6*  --   --  24.5*  --  20.4*  --   --   --   --  10.8*  HGB 10.8*   < > 10.8*   < > 9.1* 14.1   < > 11.1* 11.2* 8.8* 7.8* 6.8* 7.0*  HCT 32.6*   < > 32.7*   < > 28.1* 42.3   < > 34.1* 33.0* 26.0* 23.0* 20.0* 21.1*  MCV 87.4  --  87.2  --   --  86.5  --  88.8  --   --   --   --  87.2  PLT 242  --  251  --  95* 112*  --  135*  --   --   --   --  93*   < > = values in this interval not displayed.    Basic Metabolic Panel: Recent Labs  Lab 05/06/23 0255 05/06/23 1920  05/10/23 1158 05/10/23 2038 05/10/23 2152 05/11/23 0315 05/11/23 0908 05/11/23 1444 05/11/23 1936 05/11/23 1938 05/11/23 2115 05/12/23 0008 05/12/23 0317 05/12/23 0420  NA 129*   < > 124* 130*   < > 129* 127*   < > 128* 129* 132* 131* 131* 132*  K 3.3*   < > 4.0 2.9*   < > 3.8 3.9   < > 5.1 5.1 4.4 4.2 3.8 3.8  CL 99   < > 88* 87*  --  87* 89*  --  96*  --   --   --  95*  --   CO2 20*   < > 23 28  --  27  --   --  20*  --   --   --  25  --   GLUCOSE 277*   < > 394* 221*  --  219* 214*  --  265*  --   --   --  191*  --   BUN 22*   < > 24* 23*  --  23* 23*  --  20  --   --   --  17  --   CREATININE 1.45*   < > 1.87* 1.76*  --  1.72* 1.90*  --  1.79*  --   --   --  1.63*  --   CALCIUM 8.0*   < > 8.5* 8.8*  --  8.6*  --   --  7.2*  --   --   --  7.4*  --   MG 2.3  --   --  2.0  --   --   --   --  3.9*  --   --   --  2.9*  --    < > = values in this interval not displayed.   GFR: Estimated Creatinine Clearance: 46 mL/min (A) (by C-G formula based on SCr of 1.63 mg/dL (H)). Recent Labs  Lab 05/06/23 1658 05/06/23 1920 05/07/23 0008 05/07/23 0416 05/07/23 0542 05/08/23 0529 05/11/23 0315 05/11/23 1441 05/11/23 1936 05/12/23 0425  WBC  --   --   --    < >  --    < > 14.6* 24.5* 20.4* 10.8*  LATICACIDVEN 3.7* 2.5* 1.9  --  1.6  --   --   --   --   --    < > = values in this interval not displayed.    Liver Function Tests: Recent Labs  Lab 05/11/23 1936  AST 51*  ALT 15  ALKPHOS 64  BILITOT 0.5  PROT 4.3*  ALBUMIN 2.8*   No results for input(s): "LIPASE", "AMYLASE" in the last 168 hours. No results for input(s): "AMMONIA" in the last 168 hours.  ABG    Component Value Date/Time   PHART 7.358 05/12/2023 0420   PCO2ART 41.9 05/12/2023 0420   PO2ART 98 05/12/2023 0420   HCO3 23.4 05/12/2023 0420   TCO2 25 05/12/2023 0420   ACIDBASEDEF 2.0 05/12/2023 0420   O2SAT 60.8 05/12/2023 0425     Coagulation Profile: Recent Labs  Lab 05/11/23 1441  INR 1.5*     Cardiac Enzymes: No results for input(s): "CKTOTAL", "CKMB", "CKMBINDEX", "TROPONINI" in the last 168 hours.  HbA1C: HbA1c, POC (controlled diabetic range)  Date/Time Value Ref Range Status  04/03/2023 05:04 PM 7.5 (A) 0.0 - 7.0 % Final   Hgb A1c MFr Bld  Date/Time Value Ref Range Status  05/04/2023 06:20 AM 7.6 (H) 4.8 - 5.6 % Final    Comment:    (NOTE)         Prediabetes: 5.7 - 6.4         Diabetes: >6.4         Glycemic control for adults with diabetes: <7.0   01/04/2023 01:12 PM 7.6 (H) 4.8 - 5.6 % Final    Comment:             Prediabetes: 5.7 - 6.4          Diabetes: >6.4          Glycemic control for adults with diabetes: <7.0     CBG: Recent Labs  Lab 05/12/23 0417 05/12/23 0526 05/12/23 0619 05/12/23 0700 05/12/23 0806  GLUCAP 170* 139* 133* 124* 130*    Critical care time:      The patient is critically ill due to cardiogenic shock.  Critical care was necessary to treat or prevent imminent or life-threatening deterioration.  Critical care  was time spent personally by me on the following activities: development of treatment plan with patient and/or surrogate as well as nursing, discussions with consultants, evaluation of patient's response to treatment, examination of patient, obtaining history from patient or surrogate, ordering and performing treatments and interventions, ordering and review of laboratory studies, ordering and review of radiographic studies, pulse oximetry, re-evaluation of patient's condition and participation in multidisciplinary rounds.   During this encounter critical care time was devoted to patient care services described in this note for 37 minutes.     Cheri Fowler, MD [facility] See Amion for pager If no response to pager, please call [phone] until 7pm After 7pm, Please call E-link [phone]

## 2023-05-12 NOTE — Procedures (Signed)
Extubation Procedure Note  Patient Details:   Name: [name] DOB: 1966-06-05 MRN: [id number]   Airway Documentation:    Vent end date: 05/12/23 Vent end time: 0905   Evaluation  O2 sats: stable throughout Complications: No apparent complications Patient did tolerate procedure well. Bilateral Breath Sounds: Clear, Diminished   Yes  Pt extubated to 4L [place] per CCM MD, pt tolerated well. Cuff leak positive, no stridor noted, RN at bedside,CCM aware, vitals stable, RT will  monitor as needed.   Thornell Mule 05/12/2023, 11:34 AM

## 2023-05-12 NOTE — Progress Notes (Signed)
Patient ID: [name], female   DOB: [birth year], 57 y.o.   MRN: [id number]    Advanced Heart Failure Rounding Note   Subjective:    7/14: RHC mean RA 21, PA 50/25 mean 35, mean PCWP 28, CI 1.8 F/2.6 T, QpQs 3.3.  IABP placed.  7/19: Surgical repair of apical VSD.   Remains on IABP at 1:1 + Milrinone 0.375, dobutamine 2.5, NE 10, vasopressin 0.04.  Now off epinephrine.  Co-ox 61%, CI 2.4.   I/Os positive.   Scr stable 1.69 -> 1.64 -> 1.56->1.70 -> 1.72 -> 1.76 -> 1.63  Swan: Poor waveform, position looks unchanged on CXR.  CI 2.4 Co-ox 61%  Intubated, FiO2 0.6.    Objective:   Weight Range:  Vital Signs:   Temp:  [95.5 F (35.3 C)-100.2 F (37.9 C)] 100 F (37.8 C) (07/20 0813) Pulse Rate:  [63-139] 72 (07/20 0813) Resp:  [7-34] 16 (07/20 0700) BP: (71-145)/(48-80) 145/69 (07/20 0800) SpO2:  [91 %-99 %] 99 % (07/20 0813) Arterial Line BP: (70-131)/(36-76) 124/54 (07/20 0813) FiO2 (%):  [50 %-60 %] 60 % (07/20 0400) Weight:  [109.2 kg] 109.2 kg (07/20 0600) Last BM Date : 05/10/23  Weight change: Filed Weights   05/10/23 0500 05/11/23 0556 05/12/23 0600  Weight: 100.2 kg 98.9 kg 109.2 kg    Intake/Output:   Intake/Output Summary (Last 24 hours) at 05/12/2023 0859 Last data filed at 05/12/2023 0800 Gross per 24 hour  Intake 7239.7 ml  Output 5050 ml  Net 2189.7 ml     Physical Exam: General: Intubated Neck: Swan RIJ, no thyromegaly or thyroid nodule.  Lungs: Clear to auscultation bilaterally with normal respiratory effort. CV: Nondisplaced PMI.  Heart regular S1/S2, no S3/S4, no murmur.  1+ edema to knees.  Abdomen: Soft, nontender, no hepatosplenomegaly, no distention.  Skin: Intact without lesions or rashes.  Neurologic: Wakes up, follows commmands.  Extremities: No clubbing or cyanosis.  HEENT: Normal.    Telemetry: SR 90s Personally reviewed  Labs: Basic Metabolic Panel: Recent Labs  Lab 05/06/23 0255 05/06/23 1920 05/10/23 1158  05/10/23 2038 05/10/23 2152 05/11/23 0315 05/11/23 0908 05/11/23 1444 05/11/23 1936 05/11/23 1938 05/11/23 2115 05/12/23 0008 05/12/23 0317 05/12/23 0420  NA 129*   < > 124* 130*   < > 129* 127*   < > 128* 129* 132* 131* 131* 132*  K 3.3*   < > 4.0 2.9*   < > 3.8 3.9   < > 5.1 5.1 4.4 4.2 3.8 3.8  CL 99   < > 88* 87*  --  87* 89*  --  96*  --   --   --  95*  --   CO2 20*   < > 23 28  --  27  --   --  20*  --   --   --  25  --   GLUCOSE 277*   < > 394* 221*  --  219* 214*  --  265*  --   --   --  191*  --   BUN 22*   < > 24* 23*  --  23* 23*  --  20  --   --   --  17  --   CREATININE 1.45*   < > 1.87* 1.76*  --  1.72* 1.90*  --  1.79*  --   --   --  1.63*  --   CALCIUM 8.0*   < > 8.5* 8.8*  --  8.6*  --   --  7.2*  --   --   --  7.4*  --   MG 2.3  --   --  2.0  --   --   --   --  3.9*  --   --   --  2.9*  --    < > = values in this interval not displayed.    Liver Function Tests: Recent Labs  Lab 05/11/23 1936  AST 51*  ALT 15  ALKPHOS 64  BILITOT 0.5  PROT 4.3*  ALBUMIN 2.8*    No results for input(s): "LIPASE", "AMYLASE" in the last 168 hours. No results for input(s): "AMMONIA" in the last 168 hours.  CBC: Recent Labs  Lab 05/10/23 0357 05/10/23 2152 05/11/23 0315 05/11/23 0908 05/11/23 1243 05/11/23 1441 05/11/23 1444 05/11/23 1936 05/11/23 1938 05/11/23 2115 05/12/23 0008 05/12/23 0420 05/12/23 0425  WBC 12.1*  --  14.6*  --   --  24.5*  --  20.4*  --   --   --   --  10.8*  HGB 10.8*   < > 10.8*   < > 9.1* 14.1   < > 11.1* 11.2* 8.8* 7.8* 6.8* 7.0*  HCT 32.6*   < > 32.7*   < > 28.1* 42.3   < > 34.1* 33.0* 26.0* 23.0* 20.0* 21.1*  MCV 87.4  --  87.2  --   --  86.5  --  88.8  --   --   --   --  87.2  PLT 242  --  251  --  95* 112*  --  135*  --   --   --   --  93*   < > = values in this interval not displayed.    Cardiac Enzymes: No results for input(s): "CKTOTAL", "CKMB", "CKMBINDEX", "TROPONINI" in the last 168 hours.  BNP: BNP (last 3  results) Recent Labs    05/04/23 0620  BNP 123.2*    ProBNP (last 3 results) No results for input(s): "PROBNP" in the last 8760 hours.    Other results:  Imaging: DG CHEST PORT 1 VIEW  Result Date: 05/11/2023 CLINICAL DATA:  Hypoxia EXAM: PORTABLE CHEST 1 VIEW COMPARISON:  05/11/2023 FINDINGS: Support devices are unchanged. Cardiomegaly with vascular congestion. Low lung volumes with bibasilar atelectasis. No pneumothorax. IMPRESSION: Stable support devices. Cardiomegaly, vascular congestion. Low lung volumes with bibasilar atelectasis. Electronically Signed   By: Charlett Nose M.D.   On: 05/11/2023 21:35   DG Chest Port 1 View  Result Date: 05/11/2023 CLINICAL DATA:  Ventricular septal defect for as complication of myocardial infarction. EXAM: PORTABLE CHEST 1 VIEW COMPARISON:  05/08/2023 FINDINGS: Interval median sternotomy. Endotracheal tube has been placed, tip approximately 4.1 centimeters above the carina. Enteric tube tip overlies the stomach. RIGHT IJ Swan-Ganz catheter tip projects over the RIGHT atrium or RIGHT LOWER lobe pulmonary artery. Recommend correlation with pressures. LEFT IJ sheath is in place. LEFT-sided PICC line tip is difficult to visualized, likely overlying the mediastinum. Patient is rotated towards the RIGHT. Heart is enlarged but stable. Stable elevation of the RIGHT hemidiaphragm. There is minimal atelectasis in the LEFT lung base. IMPRESSION: 1. Interval median sternotomy. 2. Support apparatus as above. 3. LEFT basilar atelectasis. Electronically Signed   By: Norva Pavlov M.D.   On: 05/11/2023 15:29   EP STUDY  Result Date: 05/11/2023 See surgical note for result.    Medications:     Scheduled Medications:  sodium chloride   Intravenous Once   acetaminophen  1,000 mg Oral Q6H   Or   acetaminophen (TYLENOL) oral liquid 160 mg/5 mL  1,000 mg Per Tube Q6H   amitriptyline  50 mg Oral QHS   aspirin EC  325 mg Oral Daily   Or   aspirin  324 mg Per  Tube Daily   bisacodyl  10 mg Oral Daily   Or   bisacodyl  10 mg Rectal Daily   Chlorhexidine Gluconate Cloth  6 each Topical Daily   digoxin  0.0625 mg Oral Daily   docusate sodium  200 mg Oral Daily   DULoxetine  60 mg Oral BID   furosemide  40 mg Intravenous Q12H   gabapentin  300 mg Oral BID   levothyroxine  25 mcg Oral Daily   linaclotide  145 mcg Oral QAC breakfast   mouth rinse  15 mL Mouth Rinse Q2H   [START ON 05/13/2023] pantoprazole  40 mg Oral Daily   pantoprazole (PROTONIX) IV  40 mg Intravenous QHS   rosuvastatin  40 mg Oral Daily   sodium chloride flush  3 mL Intravenous Q12H    Infusions:  sodium chloride     sodium chloride     sodium chloride     amiodarone 30 mg/hr (05/12/23 0700)   dexmedetomidine (PRECEDEX) IV infusion 1.4 mcg/kg/hr (05/12/23 0813)   DOBUTamine 2.5 mcg/kg/min (05/12/23 0700)   epinephrine Stopped (05/12/23 0138)   insulin 7.5 Units/hr (05/12/23 0700)   lactated ringers 20 mL/hr at 05/12/23 0700   lactated ringers 20 mL/hr at 05/12/23 0700   levofloxacin (LEVAQUIN) IV     milrinone 0.375 mcg/kg/min (05/12/23 0700)   niCARDipine     norepinephrine (LEVOPHED) Adult infusion     vasopressin 0.04 Units/min (05/12/23 0700)    PRN Medications: sodium chloride, dextrose, lidocaine, methocarbamol, metoprolol tartrate, midazolam, morphine injection, ondansetron (ZOFRAN) IV, mouth rinse, oxyCODONE, sodium chloride flush, traMADol    Patient Profile    [name] is a 57 y.o. 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 Admitted with STEMI s/p PDI to LAD now in cardiogenic shock.    Assessment/Plan:   Cardiogenic shock -> acute HF with mildly-reduced EF complicated by apical VSD.  - Echo 6/23 EF 55%, no RWMA, normal RV, trivial MR - Echo 7/12,  EF 45% - Echo 7/14: EF 60% with apical VSD, normal RV. Apical VSD creates large shunt by RHC  QpQs 3.3.  - TEE 7/15 showed EF 55% with apical akinesis and apical VSD with significant L>R flow, RV mildly dilated and dysfunctional.   - OR 7/19 for surgical repair of apical VSD.  - IABP at 1:1 + Milrinone 0.375, dobutamine 2.5, NE 10, vasopressin 0.04.  Now off epinephrine.  Co-ox 61%, CI 2.4.  - Will decrease IABP to 1:2 today, repeat hemodynamics in a couple of hours and if stable will drop to 1:3 and remove.  Ernestine Conrad with poor waveform despite attempts at adjustment.  Move CVP to PICC.   - Lasix 40 mg IV x 2 doses today.  - Continue digoxin 0.0625  - Will need to restart Plavix tomorrow.  Eventually will need anticoagulation due to prosthetic material for VSD repair on both sides of interventricular septum at apex.  Eventual regimen will be Plavix + apixaban.    Anterior STEMI, CAD - HsTrop >24K on admission - s/p PCI to LAD 7/12, post DES 0% residual stenosis. Medical management of 40% stenosed  RCA.  - LDL unable to be calculated with high TGD, continue Crestor 40.  - Complicated by infarct-related VSD.  - Will plan to start Plavix tomorrow and will be on Plavix + apixaban as home regimen.   3. Post-MI chest pain -possible pericarditis - Chest pain resolved.    DM II - A1c last 7.6 3/24, update - SSI while admitted per primary - has diabetic foot ulcer, WOC following - ABI 12/22 were normal   Obesity - Body mass index is 37.76 kg/m.  - consider GLP-1 as OP   OSA - does not use CPAP   HLD - Continue statin  7. AKI - Stable creatinine today at 1.76 => 1.63.   8. Hyponatremia - Na 131, hypervolemic hyponatremia. Fluid restrict.   9. Anemia - Post-op, hgb 7 today.  Will get 1 unit PRBCs.   10. Thrombocytopenia - Inflammatory, has IABP.  Mildly low at 93K today.   11. Acute hypoxemic respiratory failure - Intubated post-op.   - Will diurese today and aim to extubate.     CRITICAL CARE Performed by: Marca Ancona  Total critical care time: 40  minutes  Critical care time was exclusive of separately billable procedures and treating other patients.  Critical care was necessary to treat or prevent imminent or life-threatening deterioration.  Critical care was time spent personally by me on the following activities: development of treatment plan with patient and/or surrogate as well as nursing, discussions with consultants, evaluation of patient's response to treatment, examination of patient, obtaining history from patient or surrogate, ordering and performing treatments and interventions, ordering and review of laboratory studies, ordering and review of radiographic studies, pulse oximetry and re-evaluation of patient's condition.  Marca Ancona 05/12/2023 8:59 AM

## 2023-05-12 NOTE — Progress Notes (Signed)
IABP aspirated and removed from right femoral artery, manual pressure applied for 45 minutes. Site level 0, no s+s of hematoma. Tegaderm dressing applied, bedrest instructions given.   Bilateral dp and pt pulses present with doppler.    Bedrest begins at 14:12:00

## 2023-05-12 NOTE — Progress Notes (Signed)
Dr Shirlee Latch notified on IABP numbers on 1:2.  Order received to proceed with IABP 1:3.

## 2023-05-12 NOTE — Progress Notes (Signed)
Dr Shirlee Latch notified of COOX on 1:3.  Orders received-"ok to pull"

## 2023-05-13 ENCOUNTER — Inpatient Hospital Stay (HOSPITAL_COMMUNITY): Payer: BC Managed Care – PPO

## 2023-05-13 DIAGNOSIS — I2102 ST elevation (STEMI) myocardial infarction involving left anterior descending coronary artery: Secondary | ICD-10-CM | POA: Diagnosis not present

## 2023-05-13 DIAGNOSIS — I232 Ventricular septal defect as current complication following acute myocardial infarction: Secondary | ICD-10-CM | POA: Diagnosis not present

## 2023-05-13 LAB — CBC WITH DIFFERENTIAL/PLATELET
Abs Immature Granulocytes: 0.22 10*3/uL — ABNORMAL HIGH (ref 0.00–0.07)
Basophils Absolute: 0.1 10*3/uL (ref 0.0–0.1)
Basophils Relative: 0 %
Eosinophils Absolute: 0.3 10*3/uL (ref 0.0–0.5)
Eosinophils Relative: 2 %
HCT: 23.4 % — ABNORMAL LOW (ref 36.0–46.0)
Hemoglobin: 7.3 g/dL — ABNORMAL LOW (ref 12.0–15.0)
Immature Granulocytes: 1 %
Lymphocytes Relative: 15 %
Lymphs Abs: 2.8 10*3/uL (ref 0.7–4.0)
MCH: 28.1 pg (ref 26.0–34.0)
MCHC: 31.2 g/dL (ref 30.0–36.0)
MCV: 90 fL (ref 80.0–100.0)
Monocytes Absolute: 1.8 10*3/uL — ABNORMAL HIGH (ref 0.1–1.0)
Monocytes Relative: 10 %
Neutro Abs: 12.9 10*3/uL — ABNORMAL HIGH (ref 1.7–7.7)
Neutrophils Relative %: 72 %
Platelets: 105 10*3/uL — ABNORMAL LOW (ref 150–400)
RBC: 2.6 MIL/uL — ABNORMAL LOW (ref 3.87–5.11)
RDW: 15.8 % — ABNORMAL HIGH (ref 11.5–15.5)
Smear Review: DECREASED
WBC: 18 10*3/uL — ABNORMAL HIGH (ref 4.0–10.5)
nRBC: 0.1 % (ref 0.0–0.2)

## 2023-05-13 LAB — POCT I-STAT 7, (LYTES, BLD GAS, ICA,H+H)
Acid-base deficit: 3 mmol/L — ABNORMAL HIGH (ref 0.0–2.0)
Bicarbonate: 25.6 mmol/L (ref 20.0–28.0)
Calcium, Ion: 1.09 mmol/L — ABNORMAL LOW (ref 1.15–1.40)
HCT: 26 % — ABNORMAL LOW (ref 36.0–46.0)
Hemoglobin: 8.8 g/dL — ABNORMAL LOW (ref 12.0–15.0)
O2 Saturation: 63 %
Patient temperature: 99.1
Potassium: 4.5 mmol/L (ref 3.5–5.1)
Sodium: 130 mmol/L — ABNORMAL LOW (ref 135–145)
TCO2: 28 mmol/L (ref 22–32)
pCO2 arterial: 65.8 mmHg (ref 32–48)
pH, Arterial: 7.2 — ABNORMAL LOW (ref 7.35–7.45)
pO2, Arterial: 41 mmHg — ABNORMAL LOW (ref 83–108)

## 2023-05-13 LAB — BASIC METABOLIC PANEL
Anion gap: 11 (ref 5–15)
Anion gap: 13 (ref 5–15)
BUN: 14 mg/dL (ref 6–20)
BUN: 15 mg/dL (ref 6–20)
CO2: 27 mmol/L (ref 22–32)
CO2: 27 mmol/L (ref 22–32)
Calcium: 7.8 mg/dL — ABNORMAL LOW (ref 8.9–10.3)
Calcium: 7.5 mg/dL — ABNORMAL LOW (ref 8.9–10.3)
Chloride: 90 mmol/L — ABNORMAL LOW (ref 98–111)
Chloride: 89 mmol/L — ABNORMAL LOW (ref 98–111)
Creatinine, Ser: 1.5 mg/dL — ABNORMAL HIGH (ref 0.44–1.00)
Creatinine, Ser: 1.47 mg/dL — ABNORMAL HIGH (ref 0.44–1.00)
GFR, Estimated: 40 mL/min — ABNORMAL LOW (ref >60)
GFR, Estimated: 41 mL/min — ABNORMAL LOW (ref >60)
Glucose, Bld: 126 mg/dL — ABNORMAL HIGH (ref 70–99)
Glucose, Bld: 235 mg/dL — ABNORMAL HIGH (ref 70–99)
Potassium: 3.5 mmol/L (ref 3.5–5.1)
Potassium: 4.3 mmol/L (ref 3.5–5.1)
Sodium: 128 mmol/L — ABNORMAL LOW (ref 135–145)
Sodium: 129 mmol/L — ABNORMAL LOW (ref 135–145)

## 2023-05-13 LAB — GLUCOSE, CAPILLARY
Glucose-Capillary: 129 mg/dL — ABNORMAL HIGH (ref 70–99)
Glucose-Capillary: 121 mg/dL — ABNORMAL HIGH (ref 70–99)
Glucose-Capillary: 129 mg/dL — ABNORMAL HIGH (ref 70–99)
Glucose-Capillary: 133 mg/dL — ABNORMAL HIGH (ref 70–99)
Glucose-Capillary: 127 mg/dL — ABNORMAL HIGH (ref 70–99)
Glucose-Capillary: 138 mg/dL — ABNORMAL HIGH (ref 70–99)
Glucose-Capillary: 104 mg/dL — ABNORMAL HIGH (ref 70–99)
Glucose-Capillary: 145 mg/dL — ABNORMAL HIGH (ref 70–99)
Glucose-Capillary: 61 mg/dL — ABNORMAL LOW (ref 70–99)
Glucose-Capillary: 107 mg/dL — ABNORMAL HIGH (ref 70–99)
Glucose-Capillary: 260 mg/dL — ABNORMAL HIGH (ref 70–99)
Glucose-Capillary: 240 mg/dL — ABNORMAL HIGH (ref 70–99)
Glucose-Capillary: 165 mg/dL — ABNORMAL HIGH (ref 70–99)
Glucose-Capillary: 159 mg/dL — ABNORMAL HIGH (ref 70–99)
Glucose-Capillary: 169 mg/dL — ABNORMAL HIGH (ref 70–99)

## 2023-05-13 LAB — COOXEMETRY PANEL
Carboxyhemoglobin: 1.9 % — ABNORMAL HIGH (ref 0.5–1.5)
Methemoglobin: <0.7 % (ref 0.0–1.5)
O2 Saturation: 69.5 %
Total hemoglobin: 7.8 g/dL — ABNORMAL LOW (ref 12.0–16.0)

## 2023-05-13 MED ORDER — FUROSEMIDE 10 MG/ML IJ SOLN
40.0000 mg | Freq: Once | INTRAMUSCULAR | Status: AC
  Administered 2023-05-13: 40 mg via INTRAVENOUS
  Filled 2023-05-13: qty 4

## 2023-05-13 MED ORDER — CLOPIDOGREL BISULFATE 75 MG PO TABS
75.0000 mg | ORAL_TABLET | Freq: Every day | ORAL | Status: DC
  Administered 2023-05-14 – 2023-05-24 (×11): 75 mg via ORAL
  Filled 2023-05-13 (×11): qty 1

## 2023-05-13 MED ORDER — FUROSEMIDE 10 MG/ML IJ SOLN
10.0000 mg/h | INTRAVENOUS | Status: DC
  Administered 2023-05-13: 8 mg/h via INTRAVENOUS
  Administered 2023-05-14 – 2023-05-15 (×2): 10 mg/h via INTRAVENOUS
  Filled 2023-05-13 (×3): qty 20

## 2023-05-13 MED ORDER — HYDROMORPHONE HCL 2 MG PO TABS
1.0000 mg | ORAL_TABLET | ORAL | Status: DC | PRN
  Administered 2023-05-13 (×2): 1 mg via ORAL
  Filled 2023-05-13 (×2): qty 1

## 2023-05-13 MED ORDER — NALOXONE HCL 0.4 MG/ML IJ SOLN: 0.4000 mg | Freq: Once | INTRAMUSCULAR | Status: AC

## 2023-05-13 MED ORDER — CLOPIDOGREL BISULFATE 300 MG PO TABS
300.0000 mg | ORAL_TABLET | Freq: Once | ORAL | Status: AC
  Administered 2023-05-13: 300 mg via ORAL
  Filled 2023-05-13: qty 1

## 2023-05-13 MED ORDER — HYDROMORPHONE HCL 2 MG PO TABS
2.0000 mg | ORAL_TABLET | ORAL | Status: DC | PRN
  Administered 2023-05-13 – 2023-05-29 (×10): 2 mg via ORAL
  Filled 2023-05-13 (×13): qty 1

## 2023-05-13 MED ORDER — ASPIRIN 81 MG PO TBEC
81.0000 mg | DELAYED_RELEASE_TABLET | Freq: Once | ORAL | Status: AC
  Administered 2023-05-13: 81 mg via ORAL
  Filled 2023-05-13: qty 1

## 2023-05-13 MED ORDER — POTASSIUM CHLORIDE CRYS ER 20 MEQ PO TBCR
40.0000 meq | EXTENDED_RELEASE_TABLET | Freq: Once | ORAL | Status: AC
  Administered 2023-05-13: 40 meq via ORAL
  Filled 2023-05-13: qty 2

## 2023-05-13 MED ORDER — DEXTROSE 5 % IV SOLN
500.0000 mg | Freq: Once | INTRAVENOUS | Status: AC
  Administered 2023-05-13: 500 mg via INTRAVENOUS
  Filled 2023-05-13: qty 17.8

## 2023-05-13 MED ORDER — NALOXONE HCL 0.4 MG/ML IJ SOLN
INTRAMUSCULAR | Status: AC
  Administered 2023-05-13: 0.4 mg via INTRAVENOUS
  Filled 2023-05-13: qty 1

## 2023-05-13 MED ORDER — POTASSIUM CHLORIDE CRYS ER 20 MEQ PO TBCR
60.0000 meq | EXTENDED_RELEASE_TABLET | ORAL | Status: AC
  Administered 2023-05-13 (×2): 60 meq via ORAL
  Filled 2023-05-13 (×2): qty 3

## 2023-05-13 NOTE — Progress Notes (Signed)
NAME:  [name], MRN:  [id number], DOB:  April 05, 1966, LOS: 9 ADMISSION DATE:  05/04/2023, CONSULTATION DATE: 05/11/2023 REFERRING MD: Dr. Cliffton Asters, CHIEF COMPLAINT: Chest pain  History of Present Illness:  57 year old female with a diabetes melitis, complicated with neuropathy and Wegener's granulomatosis who was initially admitted with acute anterior STEMI underwent PCI to LAD, who continues to require vasopressor support and IABP, echocardiogram showed mechanical complication including apical VSD, underwent VSD repair, remained intubated, on multiple vasopressor support, PCCM was consulted for help evaluation medical management  Pertinent  Medical History   Past Medical History:  Diagnosis Date   Chronic cough    Chronic pain    Diabetes mellitus without complication (HCC)    Dyspnea    GERD (gastroesophageal reflux disease)    Hypokalemia    Hypothyroidism    Left wrist pain 06/01/2021   Myonecrosis (HCC) 07/01/2021   Neurocardiogenic syncope    OSA (obstructive sleep apnea)    does not use CPAP   Osteomyelitis (HCC)    bilateral feet   Peripheral neuropathy    Presence of intrathecal pump    recieves Prialt/bupivicaine   Tear of gluteus medius tendon 07/01/2021   Wegener's granulomatosis 2009   Wegner's disease (congenital syphilitic osteochondritis)      Significant Hospital Events: Including procedures, antibiotic start and stop dates in addition to other pertinent events     Interim History / Subjective:  IABP was discontinued yesterday Remain on dobutamine, milrinone, norepinephrine and vasopressin Remained afebrile Was successfully extubated yesterday Currently on nasal cannula oxygen Complaining of surgical site pain  Objective   Blood pressure (!) 89/62, pulse 73, temperature 98.2 F (36.8 C), resp. rate 19, height 5\' 4"  (1.626 m), weight 108.6 kg, SpO2 93%. PAP: (25-42)/(15-36) 39/18 CVP:  [8 mmHg-50 mmHg] 17 mmHg CO:  [5.5 L/min-6.3 L/min] 5.9 L/min CI:   [2.6 L/min/m2-3 L/min/m2] 2.9 L/min/m2      Intake/Output Summary (Last 24 hours) at 05/13/2023 1033 Last data filed at 05/13/2023 0700 Gross per 24 hour  Intake 2671.52 ml  Output 2610 ml  Net 61.52 ml   Filed Weights   05/11/23 0556 05/12/23 0600 05/13/23 0500  Weight: 98.9 kg 109.2 kg 108.6 kg    Examination: Physical exam: General: Elderly female, lying on the bed HEENT: Delta/AT, eyes anicteric.  moist mucus membranes Neuro: Alert, awake following commands Chest: Coarse breath sounds, no wheezes or rhonchi Heart: Regular rate and rhythm, no murmurs or gallops Abdomen: Soft, nontender, nondistended, bowel sounds present Skin: No rash  Labs and images were reviewed Coox 69%, Na 128, K 3.5, Cr 1.5 Hb 7.3, PLT 105 CVP 18 Resolved Hospital Problem list     Assessment & Plan:  Coronary artery disease, presented with acute anterior STEMI, complicated with apical VSD. Apical VSD s/p surgical repair S/p PCI to LAD with DES  Continue aspirin and statin Loaded with Plavix, continue Plavix at 75 Chest tube management TCTS Continue pain control with tramadol, oxycodone and Dilaudid Closely monitor chest tube output  Acute HFrEF with cardiogenic shock Patient ejection fraction is 45% Remain on dobutamine, norepinephrine, vasopressin and milrinone, continue to titrate with MAP goal 65 Plan to stop dobutamine Coox improved to 69% IABP was discontinued yesterday Advanced heart failure team is following Severe remain elevated up to 18 Started on Lasix infusion Continue digoxin  Acute respiratory failure with hypoxia, due to pulmonary edema Patient was successfully extubated yesterday did Remain on nasal cannula oxygen Grade incentive spirometry She is on diuretics  Hyperlipidemia Continue Crestor  Acute kidney injury due to ischemic ATN Hyponatremia Serum creatinine improved, down to 1.5 Monitor intake and output Avoid nephrotoxic agent Closely monitor  electrolytes Serum sodium remained at 128  Diabetes type 2 Patient hemoglobin A1c is 7.6 Transition insulin infusion to long-acting and sliding scale  Expected perioperative blood loss anemia 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 Patient hemoglobin at 7.3 and platelet count improved to 105 She received 1 unit PRBC yesterday Monitor H&H and platelet count   Best Practice (right click and "Reselect all SmartList Selections" daily)   Diet/type: NPO Speech and swallow eval  DVT prophylaxis: SCD GI prophylaxis: PPI Lines: Central line, yes still needed  Foley:  Yes, and it is still needed Code Status:  full code Last date of multidisciplinary goals of care discussion [Per primary team]  Labs   CBC: Recent Labs  Lab 05/11/23 1441 05/11/23 1444 05/11/23 1936 05/11/23 1938 05/12/23 0008 05/12/23 0420 05/12/23 0425 05/12/23 1554 05/13/23 0908  WBC 24.5*  --  20.4*  --   --   --  10.8* 16.5* 18.0*  NEUTROABS  --   --   --   --   --   --   --   --  12.9*  HGB 14.1   < > 11.1*   < > 7.8* 6.8* 7.0* 8.2* 7.3*  HCT 42.3   < > 34.1*   < > 23.0* 20.0* 21.1* 25.4* 23.4*  MCV 86.5  --  88.8  --   --   --  87.2 87.9 90.0  PLT 112*  --  135*  --   --   --  93* 103* 105*   < > = values in this interval not displayed.    Basic Metabolic Panel: Recent Labs  Lab 05/10/23 2038 05/10/23 2152 05/11/23 0315 05/11/23 0908 05/11/23 1318 05/11/23 1444 05/11/23 1936 05/11/23 1938 05/12/23 0008 05/12/23 0317 05/12/23 0420 05/12/23 1554 05/13/23 0908  NA 130*   < > 129*   < > 130*   < > 128*   < > 131* 131* 132* 130* 128*  K 2.9*   < > 3.8   < > 3.7   < > 5.1   < > 4.2 3.8 3.8 3.2* 3.5  CL 87*  --  87*   < > 91*  --  96*  --   --  95*  --  91* 90*  CO2 28  --  27  --   --   --  20*  --   --  25  --  25 27  GLUCOSE 221*  --  219*   < > 258*  --  265*  --   --  191*  --  151* 126*  BUN 23*  --  23*   < > 22*  --  20  --   --  17  --  15 14  CREATININE 1.76*  --  1.72*   < > 1.40*  --  1.79*  --    --  1.63*  --  1.59* 1.50*  CALCIUM 8.8*  --  8.6*  --   --   --  7.2*  --   --  7.4*  --  7.5* 7.8*  MG 2.0  --   --   --   --   --  3.9*  --   --  2.9*  --  2.3  --    < > = values in this interval not  displayed.   GFR: Estimated Creatinine Clearance: 49.8 mL/min (A) (by C-G formula based on SCr of 1.5 mg/dL (H)). Recent Labs  Lab 05/06/23 1658 05/06/23 1920 05/07/23 0008 05/07/23 0416 05/07/23 0542 05/08/23 0529 05/11/23 1936 05/12/23 0425 05/12/23 1554 05/13/23 0908  WBC  --   --   --    < >  --    < > 20.4* 10.8* 16.5* 18.0*  LATICACIDVEN 3.7* 2.5* 1.9  --  1.6  --   --   --   --   --    < > = values in this interval not displayed.    Liver Function Tests: Recent Labs  Lab 05/11/23 1936  AST 51*  ALT 15  ALKPHOS 64  BILITOT 0.5  PROT 4.3*  ALBUMIN 2.8*   No results for input(s): "LIPASE", "AMYLASE" in the last 168 hours. No results for input(s): "AMMONIA" in the last 168 hours.  ABG    Component Value Date/Time   PHART 7.358 05/12/2023 0420   PCO2ART 41.9 05/12/2023 0420   PO2ART 98 05/12/2023 0420   HCO3 23.4 05/12/2023 0420   TCO2 25 05/12/2023 0420   ACIDBASEDEF 2.0 05/12/2023 0420   O2SAT 69.5 05/13/2023 0859     Coagulation Profile: Recent Labs  Lab 05/11/23 1441  INR 1.5*    Cardiac Enzymes: No results for input(s): "CKTOTAL", "CKMB", "CKMBINDEX", "TROPONINI" in the last 168 hours.  HbA1C: HbA1c, POC (controlled diabetic range)  Date/Time Value Ref Range Status  04/03/2023 05:04 PM 7.5 (A) 0.0 - 7.0 % Final   Hgb A1c MFr Bld  Date/Time Value Ref Range Status  05/04/2023 06:20 AM 7.6 (H) 4.8 - 5.6 % Final    Comment:    (NOTE)         Prediabetes: 5.7 - 6.4         Diabetes: >6.4         Glycemic control for adults with diabetes: <7.0   01/04/2023 01:12 PM 7.6 (H) 4.8 - 5.6 % Final    Comment:             Prediabetes: 5.7 - 6.4          Diabetes: >6.4          Glycemic control for adults with diabetes: <7.0     CBG: Recent  Labs  Lab 05/13/23 0116 05/13/23 0210 05/13/23 0311 05/13/23 0511 05/13/23 0653  GLUCAP 129* 121* 129* 133* 127*    Critical care time:      The patient is critically ill due to cardiogenic shock.  Critical care was necessary to treat or prevent imminent or life-threatening deterioration.  Critical care was time spent personally by me on the following activities: development of treatment plan with patient and/or surrogate as well as nursing, discussions with consultants, evaluation of patient's response to treatment, examination of patient, obtaining history from patient or surrogate, ordering and performing treatments and interventions, ordering and review of laboratory studies, ordering and review of radiographic studies, pulse oximetry, re-evaluation of patient's condition and participation in multidisciplinary rounds.   During this encounter critical care time was devoted to patient care services described in this note for 35 minutes.     Cheri Fowler, MD [facility] See Amion for pager If no response to pager, please call [phone] until 7pm After 7pm, Please call E-link [phone]

## 2023-05-13 NOTE — Progress Notes (Signed)
[address]       [place] [address]             [phone]      2 Days Post-Op  Procedure(s) (LRB): VENTRICULAR SEPTAL DEFECT (VSD) REPAIR (N/A) TRANSESOPHAGEAL ECHOCARDIOGRAM (N/A)   Total Length of Stay:  LOS: 9 days    SUBJECTIVE: Complaining of pain. Didn't sleep Art line not working Has not been out of bed  Vitals:   05/13/23 0630 05/13/23 0700  BP: (!) 96/53 (!) 89/62  Pulse: 74 73  Resp: (!) 21 19  Temp: 98.2 F (36.8 C) 98.2 F (36.8 C)  SpO2: 92% 93%    Intake/Output      07/20 0701 07/21 0700 07/21 0701 07/22 0700   I.V. (mL/kg) 2680.7 (24.7)    Blood 190    NG/GT     IV Piggyback 300.1    Total Intake(mL/kg) 3170.8 (29.2)    Urine (mL/kg/hr) 2508 (1)    Emesis/NG output     Drains     Blood     Chest Tube 640    Total Output 3148    Net +22.8             sodium chloride     sodium chloride     sodium chloride     amiodarone 30 mg/hr (05/13/23 0700)   DOBUTamine 2.5 mcg/kg/min (05/13/23 0700)   insulin 5 Units/hr (05/13/23 0700)   lactated ringers 20 mL/hr at 05/13/23 0700   lactated ringers 20 mL/hr at 05/13/23 0700   milrinone 0.375 mcg/kg/min (05/13/23 0700)   norepinephrine (LEVOPHED) Adult infusion 10 mcg/min (05/13/23 0700)   vasopressin 0.04 Units/min (05/13/23 0700)    CBC    Component Value Date/Time   WBC 16.5 (H) 05/12/2023 1554   RBC 2.89 (L) 05/12/2023 1554   HGB 8.2 (L) 05/12/2023 1554   HGB 12.7 01/04/2023 1312   HCT 25.4 (L) 05/12/2023 1554   HCT 39.6 01/04/2023 1312   PLT 103 (L) 05/12/2023 1554   PLT 269 01/04/2023 1312   MCV 87.9 05/12/2023 1554   MCV 93 01/04/2023 1312   MCH 28.4 05/12/2023 1554   MCHC 32.3 05/12/2023 1554   RDW 15.3 05/12/2023 1554   RDW 14.3 01/04/2023 1312   LYMPHSABS 6.0 (H) 05/04/2023 0612   LYMPHSABS 4.2 (H) 01/04/2023 1312   MONOABS 1.1 (H) 05/04/2023 0612   EOSABS 0.4 05/04/2023 0612   EOSABS 0.4 01/04/2023 1312   BASOSABS 0.1 05/04/2023 0612    BASOSABS 0.1 01/04/2023 1312   CMP     Component Value Date/Time   NA 130 (L) 05/12/2023 1554   NA 141 01/04/2023 1312   K 3.2 (L) 05/12/2023 1554   CL 91 (L) 05/12/2023 1554   CO2 25 05/12/2023 1554   GLUCOSE 151 (H) 05/12/2023 1554   BUN 15 05/12/2023 1554   BUN 14 01/04/2023 1312   CREATININE 1.59 (H) 05/12/2023 1554   CREATININE 1.07 (H) 07/01/2021 0909   CALCIUM 7.5 (L) 05/12/2023 1554   PROT 4.3 (L) 05/11/2023 1936   PROT 5.9 (L) 01/04/2023 1312   ALBUMIN 2.8 (L) 05/11/2023 1936   ALBUMIN 4.1 01/04/2023 1312   AST 51 (H) 05/11/2023 1936   ALT 15 05/11/2023 1936   ALKPHOS 64 05/11/2023 1936   BILITOT 0.5 05/11/2023 1936   BILITOT 0.3 01/04/2023 1312   GFRNONAA 38 (L) 05/12/2023 1554   GFRAA 30 (L) 01/12/2017 1208   ABG    Component  Value Date/Time   PHART 7.358 05/12/2023 0420   PCO2ART 41.9 05/12/2023 0420   PO2ART 98 05/12/2023 0420   HCO3 23.4 05/12/2023 0420   TCO2 25 05/12/2023 0420   ACIDBASEDEF 2.0 05/12/2023 0420   O2SAT 60.3 05/12/2023 1145   CBG (last 3)  Recent Labs    05/13/23 0311 05/13/23 0511 05/13/23 0653  GLUCAP 129* 133* 127*  EXAM Lungs: decreased at bases Card: RR Ext: cool Neuro: intact   ASSESSMENT: POD #2 SP VSD repair Hemodynamics acceptable on Levo, milrinone, vaso and dobutamine. Will dc dobutamine and have AHF assess next wean Will remove art line follow with cuff Need labs this am and cxr. Start Plavix with 300mg  load for new stent, start eliquis next several days. Remove pacing wires Leave chest tubes today Add dilaudid for pain management. Assess when to restart internal pain pump Diuresis   Eugenio Hoes, MD 05/13/2023

## 2023-05-13 NOTE — Progress Notes (Signed)
   05/13/23 2259  BiPAP/CPAP/SIPAP  $ Non-Invasive Ventilator  Non-Invasive Vent Initial  $ Face Mask Medium Yes  BiPAP/CPAP/SIPAP Pt Type Adult  Mask Type Full face mask  Mask Size Medium  Set Rate 10 breaths/min  Respiratory Rate 22 breaths/min  IPAP 16 cmH20  EPAP 6 cmH2O  Pressure Support 10 cmH20  PEEP 6 cmH20  FiO2 (%) 100 %  Minute Ventilation 11.6  Leak 20  Peak Inspiratory Pressure (PIP) 18  Tidal Volume (Vt) 541  Patient Home Equipment No  Auto Titrate No  Press High Alarm 25 cmH2O

## 2023-05-13 NOTE — Progress Notes (Signed)
Patient ID: [name], female   DOB: 02/10/1966, 57 y.o.   MRN: [id number]    Advanced Heart Failure Rounding Note   Subjective:    7/14: RHC mean RA 21, PA 50/25 mean 35, mean PCWP 28, CI 1.8 F/2.6 T, QpQs 3.3.  IABP placed.  7/19: Surgical repair of apical VSD.  7/20: IABP removed, extubated.   Remains on Milrinone 0.375, dobutamine 2.5, NE 10, vasopressin 0.04.    I/Os even with Lasix 40 mg IV twice yesterday  Creatinine pending   Swan: CVP 18-19 PA 42/20 CI 2.9  Complains of pain, otherwise doing ok.    Objective:   Weight Range:  Vital Signs:   Temp:  [98.1 F (36.7 C)-100 F (37.8 C)] 98.2 F (36.8 C) (07/21 0700) Pulse Rate:  [29-100] 73 (07/21 0700) Resp:  [14-41] 19 (07/21 0700) BP: (71-131)/(42-101) 89/62 (07/21 0700) SpO2:  [84 %-99 %] 93 % (07/21 0700) Arterial Line BP: (65-141)/(43-120) 95/89 (07/21 0700) Weight:  [108.6 kg] 108.6 kg (07/21 0500) Last BM Date : 05/10/23  Weight change: Filed Weights   05/11/23 0556 05/12/23 0600 05/13/23 0500  Weight: 98.9 kg 109.2 kg 108.6 kg    Intake/Output:   Intake/Output Summary (Last 24 hours) at 05/13/2023 0854 Last data filed at 05/13/2023 0700 Gross per 24 hour  Intake 3170.78 ml  Output 3108 ml  Net 62.78 ml     Physical Exam: General: NAD Neck: RIJ swan, no thyromegaly or thyroid nodule.  Lungs: Decreased at bases.  CV: Nondisplaced PMI.  Heart regular S1/S2, no S3/S4, no murmur.  1+ ankle edema.  Abdomen: Soft, nontender, no hepatosplenomegaly, no distention.  Skin: Intact without lesions or rashes.  Neurologic: Alert and oriented x 3.  Psych: Normal affect. Extremities: No clubbing or cyanosis.  HEENT: Normal.    Telemetry: SR 90s Personally reviewed  Labs: Basic Metabolic Panel: Recent Labs  Lab 05/10/23 2038 05/10/23 2152 05/11/23 0315 05/11/23 0908 05/11/23 1206 05/11/23 1313 05/11/23 1318 05/11/23 1444 05/11/23 1936 05/11/23 1938 05/11/23 2115 05/12/23 0008  05/12/23 0317 05/12/23 0420 05/12/23 1554  NA 130*   < > 129*   < > 129*   < > 130*   < > 128*   < > 132* 131* 131* 132* 130*  K 2.9*   < > 3.8   < > 4.7   < > 3.7   < > 5.1   < > 4.4 4.2 3.8 3.8 3.2*  CL 87*  --  87*   < > 90*  --  91*  --  96*  --   --   --  95*  --  91*  CO2 28  --  27  --   --   --   --   --  20*  --   --   --  25  --  25  GLUCOSE 221*  --  219*   < > 212*  --  258*  --  265*  --   --   --  191*  --  151*  BUN 23*  --  23*   < > 21*  --  22*  --  20  --   --   --  17  --  15  CREATININE 1.76*  --  1.72*   < > 1.50*  --  1.40*  --  1.79*  --   --   --  1.63*  --  1.59*  CALCIUM 8.8*  --  8.6*  --   --   --   --   --  7.2*  --   --   --  7.4*  --  7.5*  MG 2.0  --   --   --   --   --   --   --  3.9*  --   --   --  2.9*  --  2.3   < > = values in this interval not displayed.    Liver Function Tests: Recent Labs  Lab 05/11/23 1936  AST 51*  ALT 15  ALKPHOS 64  BILITOT 0.5  PROT 4.3*  ALBUMIN 2.8*    No results for input(s): "LIPASE", "AMYLASE" in the last 168 hours. No results for input(s): "AMMONIA" in the last 168 hours.  CBC: Recent Labs  Lab 05/11/23 0315 05/11/23 0908 05/11/23 1243 05/11/23 1313 05/11/23 1441 05/11/23 1444 05/11/23 1936 05/11/23 1938 05/11/23 2115 05/12/23 0008 05/12/23 0420 05/12/23 0425 05/12/23 1554  WBC 14.6*  --   --   --  24.5*  --  20.4*  --   --   --   --  10.8* 16.5*  HGB 10.8*   < > 9.1*   < > 14.1   < > 11.1*   < > 8.8* 7.8* 6.8* 7.0* 8.2*  HCT 32.7*   < > 28.1*   < > 42.3   < > 34.1*   < > 26.0* 23.0* 20.0* 21.1* 25.4*  MCV 87.2  --   --   --  86.5  --  88.8  --   --   --   --  87.2 87.9  PLT 251  --  95*  --  112*  --  135*  --   --   --   --  93* 103*   < > = values in this interval not displayed.    Cardiac Enzymes: No results for input(s): "CKTOTAL", "CKMB", "CKMBINDEX", "TROPONINI" in the last 168 hours.  BNP: BNP (last 3 results) Recent Labs    05/04/23 0620  BNP 123.2*    ProBNP (last 3  results) No results for input(s): "PROBNP" in the last 8760 hours.    Other results:  Imaging: DG Chest Port 1 View  Result Date: 05/12/2023 CLINICAL DATA:  [id number] Ventricular septal defect as complication of acute myocardial infarction [facility]) [id number] EXAM: PORTABLE CHEST - 1 VIEW COMPARISON:  05/11/2023 FINDINGS: Low lung volumes with mild hazy perihilar opacities left greater than right. Endotracheal tube, gastric tube, left arm PICC, left IJ cannula, right IJ Swan-Ganz stable in position. Mediastinal drains in place. Stable mediastinal contour.  IABP tip at the T6 level. No effusion. Sternotomy wires. IMPRESSION: Low lung volumes with mild perihilar opacities. Electronically Signed   By: Corlis Leak M.D.   On: 05/12/2023 10:10   DG CHEST PORT 1 VIEW  Result Date: 05/11/2023 CLINICAL DATA:  Hypoxia EXAM: PORTABLE CHEST 1 VIEW COMPARISON:  05/11/2023 FINDINGS: Support devices are unchanged. Cardiomegaly with vascular congestion. Low lung volumes with bibasilar atelectasis. No pneumothorax. IMPRESSION: Stable support devices. Cardiomegaly, vascular congestion. Low lung volumes with bibasilar atelectasis. Electronically Signed   By: Charlett Nose M.D.   On: 05/11/2023 21:35   DG Chest Port 1 View  Result Date: 05/11/2023 CLINICAL DATA:  Ventricular septal defect for as complication of myocardial infarction. EXAM: PORTABLE CHEST 1 VIEW COMPARISON:  05/08/2023 FINDINGS: Interval median sternotomy. Endotracheal tube has been placed, tip approximately 4.1 centimeters above the carina. Enteric tube tip overlies the stomach. RIGHT IJ Swan-Ganz catheter tip projects over the RIGHT atrium or RIGHT  LOWER lobe pulmonary artery. Recommend correlation with pressures. LEFT IJ sheath is in place. LEFT-sided PICC line tip is difficult to visualized, likely overlying the mediastinum. Patient is rotated towards the RIGHT. Heart is enlarged but stable. Stable elevation of the RIGHT hemidiaphragm. There is minimal  atelectasis in the LEFT lung base. IMPRESSION: 1. Interval median sternotomy. 2. Support apparatus as above. 3. LEFT basilar atelectasis. Electronically Signed   By: Norva Pavlov M.D.   On: 05/11/2023 15:29   EP STUDY  Result Date: 05/11/2023 See surgical note for result.    Medications:     Scheduled Medications:  acetaminophen  1,000 mg Oral Q6H   Or   acetaminophen (TYLENOL) oral liquid 160 mg/5 mL  1,000 mg Per Tube Q6H   amitriptyline  50 mg Oral QHS   bisacodyl  10 mg Oral Daily   Or   bisacodyl  10 mg Rectal Daily   Chlorhexidine Gluconate Cloth  6 each Topical Daily   docusate sodium  200 mg Oral Daily   DULoxetine  60 mg Oral BID   furosemide  40 mg Intravenous Once   gabapentin  300 mg Oral BID   levothyroxine  25 mcg Oral Daily   linaclotide  145 mcg Oral QAC breakfast   mouth rinse  15 mL Mouth Rinse 4 times per day   pantoprazole  40 mg Oral Daily   rosuvastatin  40 mg Oral Daily   sodium chloride flush  3 mL Intravenous Q12H    Infusions:  sodium chloride     sodium chloride     sodium chloride     amiodarone 30 mg/hr (05/13/23 0700)   insulin 5 Units/hr (05/13/23 0700)   lactated ringers 20 mL/hr at 05/13/23 0700   lactated ringers 20 mL/hr at 05/13/23 0700   milrinone 0.375 mcg/kg/min (05/13/23 0700)   norepinephrine (LEVOPHED) Adult infusion 10 mcg/min (05/13/23 0700)   vasopressin 0.04 Units/min (05/13/23 0700)    PRN Medications: sodium chloride, dextrose, HYDROmorphone, lidocaine, methocarbamol, metoprolol tartrate, midazolam, morphine injection, ondansetron (ZOFRAN) IV, mouth rinse, oxyCODONE, sodium chloride flush, traMADol    Patient Profile    [name] is a 57 y.o. 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 Admitted with STEMI s/p PDI to LAD now in cardiogenic shock.    Assessment/Plan:   Cardiogenic shock -> acute HF with  mildly-reduced EF complicated by apical VSD.  - Echo 6/23 EF 55%, no RWMA, normal RV, trivial MR - Echo 7/12,  EF 45% - Echo 7/14: EF 60% with apical VSD, normal RV. Apical VSD creates large shunt by RHC QpQs 3.3.  - TEE 7/15 showed EF 55% with apical akinesis and apical VSD with significant L>R flow, RV mildly dilated and dysfunctional.   - OR 7/19 for surgical repair of apical VSD.  - Milrinone 0.375, dobutamine 2.5, NE 10, vasopressin 0.04.  CI 2.9 today, no co-ox yet.  Stop dobutamine today, continue milrinone.  - Needs diuresis, CVP 18-19. Lasix 40 mg IV x 1 then 8 mg/hr.  - Eventually will need anticoagulation due to prosthetic material for VSD repair on both sides of interventricular septum at apex.  Plan to start apixaban tomorrow.   Eventual regimen will be Plavix + apixaban.    Anterior STEMI, CAD - HsTrop >24K on admission - s/p PCI to LAD 7/12, post DES 0% residual stenosis. Medical management of 40% stenosed RCA.  - LDL unable  to be calculated with high TGD, continue Crestor 40.  - Complicated by infarct-related VSD.  - Start Plavix 300 mg po x 1 today and will give ASA until apixaban started. He will be on Plavix + apixaban as home regimen.   3. Post-MI chest pain -possible pericarditis - Chest pain resolved.    DM II - A1c last 7.6 3/24, update - SSI while admitted per primary - has diabetic foot ulcer, WOC following - ABI 12/22 were normal   Obesity - Body mass index is 37.76 kg/m.  - consider GLP-1 as OP   OSA - does not use CPAP   HLD - Continue statin  7. AKI - Stable creatinine today at 1.76 => 1.63 => pending today.   8. Hyponatremia - Na pending today, hypervolemic hyponatremia. Fluid restrict.   9. Anemia - 1 unit PRBCs yesterday, CBC pending.   10. Thrombocytopenia - IABP now out, pending CBC.   11. Acute hypoxemic respiratory failure - Extubated on nasal cannula     CRITICAL CARE Performed by: Marca Ancona  Total critical care time: 40  minutes  Critical care time was exclusive of separately billable procedures and treating other patients.  Critical care was necessary to treat or prevent imminent or life-threatening deterioration.  Critical care was time spent personally by me on the following activities: development of treatment plan with patient and/or surrogate as well as nursing, discussions with consultants, evaluation of patient's response to treatment, examination of patient, obtaining history from patient or surrogate, ordering and performing treatments and interventions, ordering and review of laboratory studies, ordering and review of radiographic studies, pulse oximetry and re-evaluation of patient's condition.  Marca Ancona 05/13/2023 8:54 AM

## 2023-05-13 NOTE — Progress Notes (Signed)
PMW d/c'd without resistance and fully intact.  Slight oozing only noted at suture site.

## 2023-05-14 ENCOUNTER — Encounter: Payer: Self-pay | Admitting: Family Medicine

## 2023-05-14 DIAGNOSIS — I232 Ventricular septal defect as current complication following acute myocardial infarction: Secondary | ICD-10-CM | POA: Diagnosis not present

## 2023-05-14 DIAGNOSIS — J9601 Acute respiratory failure with hypoxia: Secondary | ICD-10-CM | POA: Diagnosis not present

## 2023-05-14 DIAGNOSIS — I2102 ST elevation (STEMI) myocardial infarction involving left anterior descending coronary artery: Secondary | ICD-10-CM | POA: Diagnosis not present

## 2023-05-14 DIAGNOSIS — R570 Cardiogenic shock: Secondary | ICD-10-CM | POA: Diagnosis not present

## 2023-05-14 DIAGNOSIS — J9602 Acute respiratory failure with hypercapnia: Secondary | ICD-10-CM

## 2023-05-14 LAB — CBC
HCT: 23.7 % — ABNORMAL LOW (ref 36.0–46.0)
Hemoglobin: 7.6 g/dL — ABNORMAL LOW (ref 12.0–15.0)
MCH: 29.5 pg (ref 26.0–34.0)
MCHC: 32.1 g/dL (ref 30.0–36.0)
MCV: 91.9 fL (ref 80.0–100.0)
Platelets: 146 10*3/uL — ABNORMAL LOW (ref 150–400)
RBC: 2.58 MIL/uL — ABNORMAL LOW (ref 3.87–5.11)
RDW: 15.6 % — ABNORMAL HIGH (ref 11.5–15.5)
WBC: 18 10*3/uL — ABNORMAL HIGH (ref 4.0–10.5)
nRBC: 0.1 % (ref 0.0–0.2)

## 2023-05-14 LAB — TYPE AND SCREEN
ABO/RH(D): O POS
Unit division: 0
Unit division: 0
Unit division: 0
Unit division: 0
Unit division: 0
Unit division: 0

## 2023-05-14 LAB — BPAM RBC
Blood Product Expiration Date: 202408222359
Blood Product Expiration Date: 202408222359
ISSUE DATE / TIME: 202407190924
Unit Type and Rh: 5100
Unit Type and Rh: 5100
Unit Type and Rh: 5100
Unit Type and Rh: 5100
Unit Type and Rh: 5100

## 2023-05-14 LAB — GLUCOSE, CAPILLARY
Glucose-Capillary: 109 mg/dL — ABNORMAL HIGH (ref 70–99)
Glucose-Capillary: 117 mg/dL — ABNORMAL HIGH (ref 70–99)
Glucose-Capillary: 125 mg/dL — ABNORMAL HIGH (ref 70–99)
Glucose-Capillary: 134 mg/dL — ABNORMAL HIGH (ref 70–99)
Glucose-Capillary: 140 mg/dL — ABNORMAL HIGH (ref 70–99)
Glucose-Capillary: 142 mg/dL — ABNORMAL HIGH (ref 70–99)
Glucose-Capillary: 143 mg/dL — ABNORMAL HIGH (ref 70–99)
Glucose-Capillary: 146 mg/dL — ABNORMAL HIGH (ref 70–99)
Glucose-Capillary: 149 mg/dL — ABNORMAL HIGH (ref 70–99)
Glucose-Capillary: 151 mg/dL — ABNORMAL HIGH (ref 70–99)
Glucose-Capillary: 151 mg/dL — ABNORMAL HIGH (ref 70–99)
Glucose-Capillary: 156 mg/dL — ABNORMAL HIGH (ref 70–99)
Glucose-Capillary: 157 mg/dL — ABNORMAL HIGH (ref 70–99)
Glucose-Capillary: 165 mg/dL — ABNORMAL HIGH (ref 70–99)
Glucose-Capillary: 180 mg/dL — ABNORMAL HIGH (ref 70–99)
Glucose-Capillary: 181 mg/dL — ABNORMAL HIGH (ref 70–99)
Glucose-Capillary: 189 mg/dL — ABNORMAL HIGH (ref 70–99)
Glucose-Capillary: 225 mg/dL — ABNORMAL HIGH (ref 70–99)
Glucose-Capillary: 239 mg/dL — ABNORMAL HIGH (ref 70–99)

## 2023-05-14 LAB — BASIC METABOLIC PANEL
Anion gap: 12 (ref 5–15)
Anion gap: 13 (ref 5–15)
BUN: 17 mg/dL (ref 6–20)
BUN: 17 mg/dL (ref 6–20)
CO2: 29 mmol/L (ref 22–32)
CO2: 29 mmol/L (ref 22–32)
Calcium: 7.7 mg/dL — ABNORMAL LOW (ref 8.9–10.3)
Calcium: 7.8 mg/dL — ABNORMAL LOW (ref 8.9–10.3)
Chloride: 90 mmol/L — ABNORMAL LOW (ref 98–111)
Chloride: 88 mmol/L — ABNORMAL LOW (ref 98–111)
Creatinine, Ser: 1.6 mg/dL — ABNORMAL HIGH (ref 0.44–1.00)
Creatinine, Ser: 1.68 mg/dL — ABNORMAL HIGH (ref 0.44–1.00)
GFR, Estimated: 37 mL/min — ABNORMAL LOW (ref >60)
GFR, Estimated: 35 mL/min — ABNORMAL LOW (ref >60)
Glucose, Bld: 165 mg/dL — ABNORMAL HIGH (ref 70–99)
Glucose, Bld: 235 mg/dL — ABNORMAL HIGH (ref 70–99)
Potassium: 4.3 mmol/L (ref 3.5–5.1)
Potassium: 3.2 mmol/L — ABNORMAL LOW (ref 3.5–5.1)
Sodium: 131 mmol/L — ABNORMAL LOW (ref 135–145)
Sodium: 130 mmol/L — ABNORMAL LOW (ref 135–145)

## 2023-05-14 LAB — POCT I-STAT 7, (LYTES, BLD GAS, ICA,H+H)
Acid-Base Excess: 7 mmol/L — ABNORMAL HIGH (ref 0.0–2.0)
Bicarbonate: 32 mmol/L — ABNORMAL HIGH (ref 20.0–28.0)
Calcium, Ion: 1.06 mmol/L — ABNORMAL LOW (ref 1.15–1.40)
HCT: 24 % — ABNORMAL LOW (ref 36.0–46.0)
Hemoglobin: 8.2 g/dL — ABNORMAL LOW (ref 12.0–15.0)
O2 Saturation: 99 %
Patient temperature: 97.9
Potassium: 3.4 mmol/L — ABNORMAL LOW (ref 3.5–5.1)
Sodium: 129 mmol/L — ABNORMAL LOW (ref 135–145)
TCO2: 33 mmol/L — ABNORMAL HIGH (ref 22–32)
pCO2 arterial: 49.4 mmHg — ABNORMAL HIGH (ref 32–48)
pH, Arterial: 7.417 (ref 7.35–7.45)
pO2, Arterial: 116 mmHg — ABNORMAL HIGH (ref 83–108)

## 2023-05-14 LAB — COOXEMETRY PANEL
Carboxyhemoglobin: 1.7 % — ABNORMAL HIGH (ref 0.5–1.5)
Methemoglobin: <0.7 % (ref 0.0–1.5)
O2 Saturation: 71 %
Total hemoglobin: 7.7 g/dL — ABNORMAL LOW (ref 12.0–16.0)

## 2023-05-14 LAB — MAGNESIUM: Magnesium: 2.1 mg/dL (ref 1.7–2.4)

## 2023-05-14 MED ORDER — BISACODYL 5 MG PO TBEC
10.0000 mg | DELAYED_RELEASE_TABLET | Freq: Two times a day (BID) | ORAL | Status: DC
  Administered 2023-05-14 – 2023-05-19 (×8): 10 mg via ORAL
  Administered 2023-05-20: 5 mg via ORAL
  Administered 2023-05-21 – 2023-05-23 (×6): 10 mg via ORAL
  Filled 2023-05-14 (×13): qty 2

## 2023-05-14 MED ORDER — ENSURE ENLIVE PO LIQD
237.0000 mL | Freq: Two times a day (BID) | ORAL | Status: DC
  Administered 2023-05-14 – 2023-05-23 (×9): 237 mL via ORAL

## 2023-05-14 MED ORDER — BISACODYL 10 MG RE SUPP: 10.0000 mg | Freq: Two times a day (BID) | RECTAL | Status: DC

## 2023-05-14 MED ORDER — METOCLOPRAMIDE HCL 5 MG/ML IJ SOLN
10.0000 mg | Freq: Four times a day (QID) | INTRAMUSCULAR | Status: AC
  Administered 2023-05-14 – 2023-05-16 (×8): 10 mg via INTRAVENOUS
  Filled 2023-05-14 (×8): qty 2

## 2023-05-14 MED ORDER — INSULIN GLARGINE-YFGN 100 UNIT/ML ~~LOC~~ SOLN
8.0000 [IU] | Freq: Two times a day (BID) | SUBCUTANEOUS | Status: DC
  Administered 2023-05-14 (×2): 8 [IU] via SUBCUTANEOUS
  Filled 2023-05-14 (×4): qty 0.08

## 2023-05-14 MED ORDER — POTASSIUM CHLORIDE CRYS ER 20 MEQ PO TBCR
40.0000 meq | EXTENDED_RELEASE_TABLET | ORAL | Status: AC
  Administered 2023-05-14 – 2023-05-15 (×3): 40 meq via ORAL
  Filled 2023-05-14 (×3): qty 2

## 2023-05-14 MED ORDER — DOCUSATE SODIUM 100 MG PO CAPS
200.0000 mg | ORAL_CAPSULE | Freq: Two times a day (BID) | ORAL | Status: DC
  Administered 2023-05-15 – 2023-05-19 (×8): 200 mg via ORAL
  Administered 2023-05-20: 100 mg via ORAL
  Administered 2023-05-21 – 2023-05-23 (×6): 200 mg via ORAL
  Filled 2023-05-14 (×15): qty 2

## 2023-05-14 MED ORDER — ASPIRIN 81 MG PO TBEC
81.0000 mg | DELAYED_RELEASE_TABLET | Freq: Every day | ORAL | Status: DC
  Administered 2023-05-14 – 2023-05-16 (×3): 81 mg via ORAL
  Filled 2023-05-14 (×3): qty 1

## 2023-05-14 MED ORDER — METOLAZONE 2.5 MG PO TABS
2.5000 mg | ORAL_TABLET | Freq: Once | ORAL | Status: AC
  Administered 2023-05-14: 2.5 mg via ORAL
  Filled 2023-05-14: qty 1

## 2023-05-14 MED ORDER — INSULIN ASPART 100 UNIT/ML IJ SOLN
0.0000 [IU] | INTRAMUSCULAR | Status: DC
  Administered 2023-05-14: 3 [IU] via SUBCUTANEOUS
  Administered 2023-05-15: 2 [IU] via SUBCUTANEOUS
  Administered 2023-05-15 (×2): 5 [IU] via SUBCUTANEOUS
  Administered 2023-05-15 (×4): 2 [IU] via SUBCUTANEOUS
  Administered 2023-05-16: 3 [IU] via SUBCUTANEOUS

## 2023-05-14 MED FILL — Potassium Chloride Inj 2 mEq/ML: INTRAVENOUS | Qty: 40 | Status: AC

## 2023-05-14 MED FILL — Heparin Sodium (Porcine) Inj 1000 Unit/ML: Qty: 1000 | Status: AC

## 2023-05-14 MED FILL — Lidocaine HCl Local Preservative Free (PF) Inj 2%: INTRAMUSCULAR | Qty: 14 | Status: AC

## 2023-05-14 NOTE — Progress Notes (Signed)
At 2215 I was in the room preparing to administer PRN medications.  The patient had been talking to me when she started coughing and then became unresponsive with agonal breathing at 2220.  The patient's lips turned blue, she was diaphoretic,and she maintained a pulse with her heart rate dropping into the 50s.  The patient was bagged with a BVM and put on BiPAP.  We were unable to get a good SpO2 reading on the monitor, so we used the portable pulse ox which showed a low reading of 79%.    Dr. Leafy Ro was called at 2228 and I received orders for a chest xray, ABG, and Narcan.   By 2230 the patient had regained consciousness and was alert and oriented x 4 with sats of 100%  Ground team was paged at 2241.

## 2023-05-14 NOTE — Progress Notes (Signed)
? ?  Inpatient Rehab Admissions Coordinator : ? ?Per therapy recommendations, patient was screened for CIR candidacy by Barbara Boyette RN MSN.  At this time patient appears to be a potential candidate for CIR. I will place a rehab consult per protocol for full assessment. Please call me with any questions. ? ?Barbara Boyette RN MSN ?Admissions Coordinator ?[phone] ?  ?

## 2023-05-14 NOTE — Progress Notes (Signed)
Physical Therapy Treatment Patient Details Name: [name] MRN: [id number] DOB: 1966-09-19 Today's Date: 05/14/2023   History of Present Illness Pt is 57 year old presented to [facility] on  05/04/23 for STEMI and underwent PCI to LAD on 7/12. Pt with cardiogenic shock and acute heart failure. Pt continued with chest pain and found to have apical ventral septal defect. Pt underwent rt heart cath and IABP placed on 7/14. Underwent surgical ventral septal defect repair on 7/19. IABP removed and pt extubated 7/20. 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    PT Comments  Returned to work on more mobility and pt able to sit EOB and then stand bedside with 2 person assist. Pt very motivated to work toward returning to independence. Patient will benefit from intensive inpatient follow up therapy, >3 hours/day      Assistance Recommended at Discharge Frequent or constant Supervision/Assistance  If plan is discharge home, recommend the following:  Can travel by private vehicle    A lot of help with walking and/or transfers;A lot of help with bathing/dressing/bathroom;Assistance with cooking/housework;Assist for transportation;Help with stairs or ramp for entrance      Equipment Recommendations  Other (comment) (To be determined)    Recommendations for Other Services Rehab consult     Precautions / Restrictions Precautions Precautions: Fall;Sternal;Other (comment) Precaution Comments: sores bilateral feet/legs, watch HR/O2     Mobility  Bed Mobility Overal bed mobility: Needs Assistance Bed Mobility: Rolling, Sidelying to Sit, Sit to Supine Rolling: Mod assist Sidelying to sit: +2 for physical assistance   Sit to supine: +2 for physical assistance, Max assist   General bed mobility comments:  Assist to bring legs off of bed, elevate trunk into sitting, and bring hips to EOB. Assist to lower trunk and bring legs back up into bed    Transfers Overall transfer level: Needs assistance Equipment used: 2 person hand held assist Transfers: Sit to/from Stand Sit to Stand: +2 physical assistance, Mod assist           General transfer comment: Assist to bring hips up and for stability. Needed to block bilateral feet to prevent slipping    Ambulation/Gait             Pre-gait activities: Side stepped up side of bed with +2 min assist with forearm support     Stairs             Wheelchair Mobility     Tilt Bed    Modified Rankin (Stroke Patients Only)       Balance Overall balance assessment: Needs assistance Sitting-balance support: No upper extremity supported, Feet supported Sitting balance-Leahy Scale: Fair     Standing balance support: Bilateral upper extremity supported, During functional activity Standing balance-Leahy Scale: Poor Standing balance comment: UE support and +2 min assist                            Cognition Arousal/Alertness: Awake/alert Behavior During Therapy: WFL for tasks assessed/performed Overall Cognitive Status: Within Functional Limits for tasks assessed                                          Exercises General Exercises - Upper Extremity Shoulder  Flexion: AAROM, Both, 10 reps, Supine General Exercises - Lower Extremity Heel Slides: AAROM, Both, 5 reps, Supine Straight Leg Raises: AAROM, Both, 5 reps, Supine    General Comments General comments (skin integrity, edema, etc.): VSS on 10L HFNC      Pertinent Vitals/Pain Pain Assessment Pain Assessment: Faces Faces Pain Scale: Hurts even more Pain Location: chest incision Pain Descriptors / Indicators: Operative site guarding, Grimacing Pain Intervention(s): Limited activity within patient's tolerance, Monitored during session,  Repositioned    Home Living Family/patient expects to be discharged to:: Private residence Living Arrangements: Spouse/significant other Available Help at Discharge: Family;Available PRN/intermittently Type of Home: House Home Access: Stairs to enter Entrance Stairs-Rails: None Entrance Stairs-Number of Steps: 2   Home Layout: One level Home Equipment: Agricultural consultant (2 wheels);Electric scooter;BSC/3in1;Cane - single point;Tub bench;Grab bars - tub/shower Additional Comments: adjustable bed    Prior Function            PT Goals (current goals can now be found in the care plan section) Acute Rehab PT Goals PT Goal Formulation: With patient Time For Goal Achievement: 05/28/23 Potential to Achieve Goals: Fair Progress towards PT goals: Progressing toward goals    Frequency    Min 1X/week      PT Plan Discharge plan needs to be updated    Co-evaluation              AM-PAC PT "6 Clicks" Mobility   Outcome Measure  Help needed turning from your back to your side while in a flat bed without using bedrails?: A Lot Help needed moving from lying on your back to sitting on the side of a flat bed without using bedrails?: Total Help needed moving to and from a bed to a chair (including a wheelchair)?: Total Help needed standing up from a chair using your arms (e.g., wheelchair or bedside chair)?: Total Help needed to walk in hospital room?: Total Help needed climbing 3-5 steps with a railing? : Total 6 Click Score: 7    End of Session Equipment Utilized During Treatment: Oxygen Activity Tolerance: Patient tolerated treatment well Patient left: in bed;with call bell/phone within reach;with nursing/sitter in room Nurse Communication: Mobility status (Nurses present and assisted with mobility) PT Visit Diagnosis: Other abnormalities of gait and mobility (R26.89);Muscle weakness (generalized) (M62.81);Difficulty in walking, not elsewhere classified (R26.2);Pain Pain - part  of body:  (chest incision)     Time: 1610-9604 PT Time Calculation (min) (ACUTE ONLY): 26 min  Charges:    $Therapeutic Activity: 23-37 mins PT General Charges $$ ACUTE PT VISIT: 1 Visit                     [facility] PT Acute Rehabilitation Services Office [phone]    Angelina Ok [facility] 05/14/2023, 5:06 PM

## 2023-05-14 NOTE — Progress Notes (Signed)
NAME:  [name], MRN:  [id number], DOB:  1966/04/04, LOS: 10 ADMISSION DATE:  05/04/2023, CONSULTATION DATE: 05/11/2023 REFERRING MD: Dr. Cliffton Asters, CHIEF COMPLAINT: Chest pain  History of Present Illness:  57 year old female with a diabetes melitis, complicated with neuropathy and Wegener's granulomatosis who was initially admitted with acute anterior STEMI underwent PCI to LAD, who continues to require vasopressor support and IABP, echocardiogram showed mechanical complication including apical VSD, underwent VSD repair, remained intubated, on multiple vasopressor support, PCCM was consulted for help evaluation medical management  Pertinent  Medical History   Past Medical History:  Diagnosis Date   Chronic cough    Chronic pain    Diabetes mellitus without complication (HCC)    Dyspnea    GERD (gastroesophageal reflux disease)    Hypokalemia    Hypothyroidism    Left wrist pain 06/01/2021   Myonecrosis (HCC) 07/01/2021   Neurocardiogenic syncope    OSA (obstructive sleep apnea)    does not use CPAP   Osteomyelitis (HCC)    bilateral feet   Peripheral neuropathy    Presence of intrathecal pump    recieves Prialt/bupivicaine   Tear of gluteus medius tendon 07/01/2021   Wegener's granulomatosis 2009   Wegner's disease (congenital syphilitic osteochondritis)      Significant Hospital Events: Including procedures, antibiotic start and stop dates in addition to other pertinent events     Interim History / Subjective:  Overnight patient had a coughing episode, after that she started desaturating, became bradycardic and unresponsive, requiring BVM  After that she became responsive, ABG was done which showed acute hypoxic/hypercapnic respiratory failure, patient was placed on BiPAP with improvement in oxygenation   Dobutamine was titrated off Remain on milrinone, norepinephrine and vasopressin She is afebrile   Objective   Blood pressure 118/68, pulse 61, temperature 99 F (37.2  C), temperature source Axillary, resp. rate 15, height 5\' 4"  (1.626 m), weight 108.5 kg, SpO2 (!) 89%. PAP: (40-64)/(24-50) 55/43 CVP:  [13 mmHg-33 mmHg] 13 mmHg  FiO2 (%):  [60 %-100 %] 60 % PEEP:  [6 cmH20] 6 cmH20 Pressure Support:  [10 cmH20] 10 cmH20   Intake/Output Summary (Last 24 hours) at 05/14/2023 4742 Last data filed at 05/14/2023 0800 Gross per 24 hour  Intake 1322.25 ml  Output 3470 ml  Net -2147.75 ml   Filed Weights   05/12/23 0600 05/13/23 0500 05/14/23 0500  Weight: 109.2 kg 108.6 kg 108.5 kg    Examination: Physical exam: General: Elderly morbidly obese female, lying on the bed.  Currently on BiPAP HEENT: Lowgap/AT, eyes anicteric.  moist mucus membranes Neuro: Alert, awake following commands Chest: Coarse breath sounds, no wheezes or rhonchi Heart: Regular rate and rhythm, no murmurs or gallops Abdomen: Soft, nontender, nondistended, bowel sounds present Skin: No rash  Labs and images were reviewed Coox 71%, Na 131, K 4.3, Cr 1.6 Hb 7.6, PLT 146 CVP 12 Resolved Hospital Problem list     Assessment & Plan:  Coronary artery disease, presented with acute anterior STEMI, complicated with apical VSD. Apical VSD s/p surgical repair S/p PCI to LAD with DES  On DAPT for now, plan is to have Plavix with apixaban Continue statin Chest tube will be removed per TCTS Continue pain control with tramadol, oxycodone and Dilaudid Chest tube output was 250 cc in last 24 hours  Acute HFrEF with cardiogenic shock Patient ejection fraction is 45% Dobutamine was titrated off Remain on, norepinephrine, vasopressin and milrinone, continue to titrate with MAP goal 65 Will start  titrating vasopressin Coox improved to 71 % IABP was discontinued on 7/20 Advanced heart failure team is following Continue Lasix infusion at 12 mg/h, continue metolazone  Acute respiratory failure with hypoxia/hypercapnia, due to pulmonary edema Mucous plugging Patient had an episode of  coughing followed by she became dyspneic, hypoxic and unresponsive requiring BVM She was on BiPAP overnight, ABGs improved BiPAP was transitioned to high flow nasal cannula oxygen Encourage incentive spirometry  Hyperlipidemia Continue Crestor  Acute kidney injury due to ischemic ATN Hyponatremia Serum creatinine is a stable, slightly up from 1.5-1.6 Monitor intake and output Avoid nephrotoxic agent Closely monitor electrolytes Serum sodium remained at 131  Diabetes type 2 Patient hemoglobin A1c is 7.6 Will transition insulin infusion to long-acting and sliding scale  Expected perioperative blood loss anemia 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 Patient hemoglobin at 7.3 and platelet count improved to 105 S/p1 unit PRBC Monitor H&H and platelet count  Morbid obesity Diet and exercise counseling provided  Best Practice (right click and "Reselect all SmartList Selections" daily)   Diet/type: Clear liquid diet, advance per recommendation from SLP DVT prophylaxis: SCD GI prophylaxis: PPI Lines: Central line, yes still needed  Foley:  Yes, and it is still needed Code Status:  full code Last date of multidisciplinary goals of care discussion [Per primary team]  Labs   CBC: Recent Labs  Lab 05/11/23 1936 05/11/23 1938 05/12/23 0425 05/12/23 1554 05/13/23 0908 05/13/23 2259 05/14/23 0523  WBC 20.4*  --  10.8* 16.5* 18.0*  --  18.0*  NEUTROABS  --   --   --   --  12.9*  --   --   HGB 11.1*   < > 7.0* 8.2* 7.3* 8.8* 7.6*  HCT 34.1*   < > 21.1* 25.4* 23.4* 26.0* 23.7*  MCV 88.8  --  87.2 87.9 90.0  --  91.9  PLT 135*  --  93* 103* 105*  --  146*   < > = values in this interval not displayed.    Basic Metabolic Panel: Recent Labs  Lab 05/10/23 2038 05/10/23 2152 05/11/23 1936 05/11/23 1938 05/12/23 0317 05/12/23 0420 05/12/23 1554 05/13/23 0908 05/13/23 1727 05/13/23 2259 05/14/23 0523  NA 130*   < > 128*   < > 131*   < > 130* 128* 129* 130* 131*  K 2.9*   < > 5.1    < > 3.8   < > 3.2* 3.5 4.3 4.5 4.3  CL 87*   < > 96*  --  95*  --  91* 90* 89*  --  90*  CO2 28   < > 20*  --  25  --  25 27 27   --  29  GLUCOSE 221*   < > 265*  --  191*  --  151* 126* 235*  --  165*  BUN 23*   < > 20  --  17  --  15 14 15   --  17  CREATININE 1.76*   < > 1.79*  --  1.63*  --  1.59* 1.50* 1.47*  --  1.60*  CALCIUM 8.8*   < > 7.2*  --  7.4*  --  7.5* 7.8* 7.5*  --  7.7*  MG 2.0  --  3.9*  --  2.9*  --  2.3  --   --   --  2.1   < > = values in this interval not displayed.   GFR: Estimated Creatinine Clearance: 46.7 mL/min (A) (by C-G formula based  on SCr of 1.6 mg/dL (H)). Recent Labs  Lab 05/12/23 0425 05/12/23 1554 05/13/23 0908 05/14/23 0523  WBC 10.8* 16.5* 18.0* 18.0*    Liver Function Tests: Recent Labs  Lab 05/11/23 1936  AST 51*  ALT 15  ALKPHOS 64  BILITOT 0.5  PROT 4.3*  ALBUMIN 2.8*   No results for input(s): "LIPASE", "AMYLASE" in the last 168 hours. No results for input(s): "AMMONIA" in the last 168 hours.  ABG    Component Value Date/Time   PHART 7.200 (L) 05/13/2023 2259   PCO2ART 65.8 (HH) 05/13/2023 2259   PO2ART 41 (L) 05/13/2023 2259   HCO3 25.6 05/13/2023 2259   TCO2 28 05/13/2023 2259   ACIDBASEDEF 3.0 (H) 05/13/2023 2259   O2SAT 71 05/14/2023 0543     Coagulation Profile: Recent Labs  Lab 05/11/23 1441  INR 1.5*    Cardiac Enzymes: No results for input(s): "CKTOTAL", "CKMB", "CKMBINDEX", "TROPONINI" in the last 168 hours.  HbA1C: HbA1c, POC (controlled diabetic range)  Date/Time Value Ref Range Status  04/03/2023 05:04 PM 7.5 (A) 0.0 - 7.0 % Final   Hgb A1c MFr Bld  Date/Time Value Ref Range Status  05/04/2023 06:20 AM 7.6 (H) 4.8 - 5.6 % Final    Comment:    (NOTE)         Prediabetes: 5.7 - 6.4         Diabetes: >6.4         Glycemic control for adults with diabetes: <7.0   01/04/2023 01:12 PM 7.6 (H) 4.8 - 5.6 % Final    Comment:             Prediabetes: 5.7 - 6.4          Diabetes: >6.4           Glycemic control for adults with diabetes: <7.0     CBG: Recent Labs  Lab 05/14/23 0312 05/14/23 0424 05/14/23 0616 05/14/23 0745 05/14/23 0847  GLUCAP 157* 156* 143* 142* 140*    Critical care time:      The patient is critically ill due to cardiogenic shock.  Critical care was necessary to treat or prevent imminent or life-threatening deterioration.  Critical care was time spent personally by me on the following activities: development of treatment plan with patient and/or surrogate as well as nursing, discussions with consultants, evaluation of patient's response to treatment, examination of patient, obtaining history from patient or surrogate, ordering and performing treatments and interventions, ordering and review of laboratory studies, ordering and review of radiographic studies, pulse oximetry, re-evaluation of patient's condition and participation in multidisciplinary rounds.   During this encounter critical care time was devoted to patient care services described in this note for 33 minutes.     Cheri Fowler, MD [facility] See Amion for pager If no response to pager, please call [phone] until 7pm After 7pm, Please call E-link [phone]

## 2023-05-14 NOTE — Progress Notes (Signed)
RT took pt off bipap and placed pt on HFNC (salter 10L) per CCM. Pt seems to be tolerating the HFNC well and is resting comfortable at this time. RT will continue to monitor as needed.

## 2023-05-14 NOTE — Inpatient Diabetes Management (Signed)
Inpatient Diabetes Program Recommendations  AACE/ADA: New Consensus Statement on Inpatient Glycemic Control (2015)  Target Ranges:  Prepandial:   less than 140 mg/dL      Peak postprandial:   less than 180 mg/dL (1-2 hours)      Critically ill patients:  140 - 180 mg/dL    Latest Reference Range & Units 05/14/23 02:11 05/14/23 03:12 05/14/23 04:24 05/14/23 06:16 05/14/23 07:45 05/14/23 08:47 05/14/23 09:57 05/14/23 10:29  Glucose-Capillary 70 - 99 mg/dL 952 (H) 841 (H) 324 (H) 143 (H) 142 (H) 140 (H) 109 (H) 117 (H)   Review of Glycemic Control  Diabetes history: DM2 Outpatient Diabetes medications: Metformin 500 mg BID Current orders for Inpatient glycemic control: IV insulin  Inpatient Diabetes Program Recommendations:    Insulin: Once provider is ready to transition from IV to SQ insulin, please consider ordering Semglee 8 units BID (based on 108.5 kg x 0.15 units), CBGs Q4H, and Novolog 0-9 units Q4H.  Thanks, Orlando Penner, RN, MSN, CDCES Diabetes Coordinator Inpatient Diabetes Program [phone] (Team Pager from 8am to 5pm)

## 2023-05-14 NOTE — Progress Notes (Addendum)
Patient ID: [name], female   DOB: May 17, 1966, 57 y.o.   MRN: [id number]    Advanced Heart Failure Rounding Note   Subjective:    7/14: RHC mean RA 21, PA 50/25 mean 35, mean PCWP 28, CI 1.8 F/2.6 T, QpQs 3.3.  IABP placed.  7/19: Surgical repair of apical VSD.  7/20: IABP removed, extubated.   Overnight, had a coughing episode and became unresponsive, hypoxic and bradycardic. Bagged with BVM and placed on BiPAP. Given Narcan. Lasix gtt increased to 10/hr.  Remains on Milrinone 0.375, NE 10>4, Vaso 0.04. DBA off.  CO-OX 71%.  CVP 12.  2.3L UOP yesterday, minimal output overnight from pleural and mediastinal tubes.  Awake and following commands on BiPAP. FiO2 60%.       Objective:   Weight Range:  Vital Signs:   Temp:  [98.2 F (36.8 C)-99 F (37.2 C)] 99 F (37.2 C) (07/21 2300) Pulse Rate:  [25-112] 61 (07/21 1730) Resp:  [9-28] 14 (07/22 0700) BP: (63-172)/(49-142) 105/88 (07/22 0645) SpO2:  [68 %-100 %] 100 % (07/22 0700) Arterial Line BP: (121-137)/(103-127) 128/107 (07/21 1030) FiO2 (%):  [100 %] 100 % (07/22 0259) Weight:  [108.5 kg] 108.5 kg (07/22 0500) Last BM Date : 05/10/23  Weight change: Filed Weights   05/12/23 0600 05/13/23 0500 05/14/23 0500  Weight: 109.2 kg 108.6 kg 108.5 kg    Intake/Output:   Intake/Output Summary (Last 24 hours) at 05/14/2023 0744 Last data filed at 05/14/2023 0700 Gross per 24 hour  Intake 1383.97 ml  Output 2550 ml  Net -1166.03 ml     Physical Exam: General: Fatigued appearing. HEENT: normal Neck: supple. JVP 12-14. Carotids 2+ bilat; no bruits.  Cor: PMI nondisplaced. Regular rate & rhythm. No rubs, gallops or murmurs. + wound vac Lungs: coarse Abdomen: soft, nontender, nondistended.  Extremities: no cyanosis, clubbing, rash, 1+ edema, b/l transmetatarsal amputation, LUE PICC Neuro: Awake and following commands.    Telemetry: SR 60s-70s  Labs: Basic Metabolic Panel: Recent Labs  Lab 05/10/23 2038  05/10/23 2152 05/11/23 1936 05/11/23 1938 05/12/23 0317 05/12/23 0420 05/12/23 1554 05/13/23 0908 05/13/23 1727 05/13/23 2259 05/14/23 0523  NA 130*   < > 128*   < > 131*   < > 130* 128* 129* 130* 131*  K 2.9*   < > 5.1   < > 3.8   < > 3.2* 3.5 4.3 4.5 4.3  CL 87*   < > 96*  --  95*  --  91* 90* 89*  --  90*  CO2 28   < > 20*  --  25  --  25 27 27   --  29  GLUCOSE 221*   < > 265*  --  191*  --  151* 126* 235*  --  165*  BUN 23*   < > 20  --  17  --  15 14 15   --  17  CREATININE 1.76*   < > 1.79*  --  1.63*  --  1.59* 1.50* 1.47*  --  1.60*  CALCIUM 8.8*   < > 7.2*  --  7.4*  --  7.5* 7.8* 7.5*  --  7.7*  MG 2.0  --  3.9*  --  2.9*  --  2.3  --   --   --  2.1   < > = values in this interval not displayed.    Liver Function Tests: Recent Labs  Lab 05/11/23 1936  AST 51*  ALT 15  ALKPHOS 64  BILITOT 0.5  PROT 4.3*  ALBUMIN 2.8*    No results for input(s): "LIPASE", "AMYLASE" in the last 168 hours. No results for input(s): "AMMONIA" in the last 168 hours.  CBC: Recent Labs  Lab 05/11/23 1936 05/11/23 1938 05/12/23 0425 05/12/23 1554 05/13/23 0908 05/13/23 2259 05/14/23 0523  WBC 20.4*  --  10.8* 16.5* 18.0*  --  18.0*  NEUTROABS  --   --   --   --  12.9*  --   --   HGB 11.1*   < > 7.0* 8.2* 7.3* 8.8* 7.6*  HCT 34.1*   < > 21.1* 25.4* 23.4* 26.0* 23.7*  MCV 88.8  --  87.2 87.9 90.0  --  91.9  PLT 135*  --  93* 103* 105*  --  146*   < > = values in this interval not displayed.    Cardiac Enzymes: No results for input(s): "CKTOTAL", "CKMB", "CKMBINDEX", "TROPONINI" in the last 168 hours.  BNP: BNP (last 3 results) Recent Labs    05/04/23 0620  BNP 123.2*    ProBNP (last 3 results) No results for input(s): "PROBNP" in the last 8760 hours.    Other results:  Imaging: DG CHEST PORT 1 VIEW  Result Date: 05/13/2023 CLINICAL DATA:  Hypoxia. EXAM: PORTABLE CHEST 1 VIEW COMPARISON:  Chest radiograph dated 05/13/2023. FINDINGS: Interval removal of the  Swan-Ganz catheter. Additional support lines as seen on the prior radiograph. Overall slight worsening of the vascular congestion since the prior radiograph. Similar left pleural effusion and left lung base atelectasis or infiltrate. No pneumothorax. Stable cardiomegaly. Median sternotomy wires. No acute osseous pathology. IMPRESSION: 1. Interval removal of the Swan-Ganz catheter. 2. Slight worsening of the vascular congestion. 3. Similar left pleural effusion and left lung base atelectasis or infiltrate. Electronically Signed   By: Elgie Collard M.D.   On: 05/13/2023 22:47   DG CHEST PORT 1 VIEW  Result Date: 05/13/2023 CLINICAL DATA:  97360 VSD (ventricular septal defect) [id number] Encounter for central line placement [id number] EXAM: PORTABLE CHEST - 1 VIEW COMPARISON:  the previous day's study FINDINGS: Patient has been extubated and the gastric tube removed. Left IJ sheath, left arm PICC, and right IJ Swan-Ganz stable position. Mediastinal drains stable. Low lung volumes with slightly improved right lung aeration but some worsening of left basilar consolidation/atelectasis. Mild cardiomegaly stable. Sternotomy wires. Small left effusion. IMPRESSION: 1. Interval extubation and removal of gastric tube. 2. Slightly improved right lung aeration but worsening left basilar consolidation/atelectasis. Electronically Signed   By: Corlis Leak M.D.   On: 05/13/2023 09:38     Medications:     Scheduled Medications:  acetaminophen  1,000 mg Oral Q6H   Or   acetaminophen (TYLENOL) oral liquid 160 mg/5 mL  1,000 mg Per Tube Q6H   amitriptyline  50 mg Oral QHS   bisacodyl  10 mg Oral Daily   Or   bisacodyl  10 mg Rectal Daily   Chlorhexidine Gluconate Cloth  6 each Topical Daily   clopidogrel  75 mg Oral Daily   docusate sodium  200 mg Oral Daily   DULoxetine  60 mg Oral BID   gabapentin  300 mg Oral BID   levothyroxine  25 mcg Oral Daily   linaclotide  145 mcg Oral QAC breakfast   mouth rinse  15 mL  Mouth Rinse 4 times per day   pantoprazole  40 mg Oral Daily   rosuvastatin  40 mg Oral Daily  Infusions:  sodium chloride     sodium chloride     amiodarone 30 mg/hr (05/14/23 0700)   furosemide (LASIX) 200 mg in dextrose 5 % 100 mL (2 mg/mL) infusion 10 mg/hr (05/14/23 0700)   insulin 4.6 Units/hr (05/14/23 0700)   milrinone 0.375 mcg/kg/min (05/14/23 0700)   norepinephrine (LEVOPHED) Adult infusion 4 mcg/min (05/14/23 0700)   vasopressin 0.04 Units/min (05/14/23 0700)    PRN Medications: sodium chloride, dextrose, HYDROmorphone, lidocaine, methocarbamol, metoprolol tartrate, morphine injection, ondansetron (ZOFRAN) IV, mouth rinse, oxyCODONE, traMADol    Patient Profile    Dawnette Mione is a 57 y.o. 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 Admitted with STEMI s/p PDI to LAD now in cardiogenic shock.    Assessment/Plan:   Cardiogenic shock -> acute HF with mildly-reduced EF complicated by apical VSD.  - Echo 6/23 EF 55%, no RWMA, normal RV, trivial MR - Echo 7/12,  EF 45% - Echo 7/14: EF 60% with apical VSD, normal RV. Apical VSD creates large shunt by RHC QpQs 3.3.  - TEE 7/15 showed EF 55% with apical akinesis and apical VSD with significant L>R flow, RV mildly dilated and dysfunctional.   - OR 7/19 for surgical repair of apical VSD.  - Milrinone 0.375, NE down to 4, vasopressin 0.04.  DBA off. CO-OX 71%. MAP 80s. Start to wean Vaso.  - Continue milrinone while diuresing. - CVP 12. Pulmonary edema on CXR. Continue lasix gtt at 12/hr. Give 2.5 mg metolazone. - Eventually will need anticoagulation due to prosthetic material for VSD repair on both sides of interventricular septum at apex.  Start anticoagulation when okay with TCTS.  Eventual regimen will be Plavix + apixaban.    Anterior STEMI, CAD - HsTrop >24K on admission - s/p PCI to LAD 7/12, post DES 0% residual  stenosis. Medical management of 40% stenosed RCA.  - LDL unable to be calculated with high TGD, continue Crestor 40.  - Complicated by infarct-related VSD.  - On DAPT with ASA and plavix until apixaban started. He will be on Plavix + apixaban as home regimen.   3. Post-MI chest pain -possible pericarditis - Chest pain resolved.    DM II - A1c last 7.6 - SSI while admitted per primary - has diabetic foot ulcer, WOC has seen - ABI 12/22 were normal   Obesity - Body mass index is 37.76 kg/m.  - consider GLP-1 as OP   OSA - does not use CPAP   HLD - Continue statin  7. AKI - Scr up slightly today, 1.47>1.60 - Follow with diuresis  8. Hyponatremia - Hypervolemic hyponatremia.  - Na 131 this am - Fluid restrict.  - Diurese  9. Anemia - Hgb 7.6 - 1 u RBCs on 07/20 - Transfuse Hgb < 7  10. Thrombocytopenia - IABP now out - Improving, platelets 146K this am  11. Acute hypoxemic respiratory failure - Coughing followed by hypoxia and unresponsive episode overnight. Now on BiPAP. FiO2 60% - BiPAP per CCM - Persistent leukocytosis, 18K today. AF. ? Empiric abx.  - Diurese today    FINCH, LINDSAY N 05/14/2023 7:44 AM  Agree with above.   Extubated. Now on Bipap. Weaning inotropes/pressors. Diuresing on lasix gtt and metolazone. Hemodynamics stable. SCr up slightly.   General:  Weak appearing. On bipap HEENT: normal Neck: supple. RIJ swan Carotids 2+ bilat; no bruits. No lymphadenopathy or thryomegaly appreciated. Cor: Sternal dressing  and CT Regular rate & rhythm. No rubs, gallops or murmurs. Lungs: clear Abdomen: obese soft, nontender, nondistended. No hepatosplenomegaly. No bruits or masses. Good bowel sounds. Extremities: no cyanosis, clubbing, rash,1+ edema bilat trans met amputations Neuro: alert & orientedx3, cranial nerves grossly intact. moves all 4 extremities w/o difficulty. Affect pleasant  Continue IV diuresis. Wean inotropes pressors as tolerated.  Encourage IS. Low threshold to add abx. Mobilize.   CRITICAL CARE Performed by: Arvilla Meres  Total critical care time: 40 minutes  Critical care time was exclusive of separately billable procedures and treating other patients.  Critical care was necessary to treat or prevent imminent or life-threatening deterioration.  Critical care was time spent personally by me (independent of midlevel providers or residents) on the following activities: development of treatment plan with patient and/or surrogate as well as nursing, discussions with consultants, evaluation of patient's response to treatment, examination of patient, obtaining history from patient or surrogate, ordering and performing treatments and interventions, ordering and review of laboratory studies, ordering and review of radiographic studies, pulse oximetry and re-evaluation of patient's condition.  Arvilla Meres, MD  9:44 AM

## 2023-05-14 NOTE — Progress Notes (Signed)
Patient ID: [name], female   DOB: Aug 20, 1966, 57 y.o.   MRN: [id number] TCTS Evening Rounds:  Hemodynamically stable today on milrinone 0.375, NE 3, vaso 0.01.  CVP 12.  98% sats on HFNC 10L.  Diuresing well on lasix drip 10. -2800 so far today.  Was out of bed and took a few steps.

## 2023-05-14 NOTE — Evaluation (Signed)
Physical Therapy Evaluation Patient Details Name: [name] MRN: [id number] DOB: 1966-04-20 Today's Date: 05/14/2023  History of Present Illness  Pt is 57 year old presented to [facility] on  05/04/23 for STEMI and underwent PCI to LAD on 7/12. Pt with cardiogenic shock and acute heart failure. Pt continued with chest pain and found to have apical ventral septal defect. Pt underwent rt heart cath and IABP placed on 7/14. Underwent surgical ventral septal defect repair on 7/19. IABP removed and pt extubated 7/20. 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  Clinical Impression  Pt admitted with above diagnosis and presents to PT with functional limitations due to deficits listed below (See PT problem list). Pt needs skilled PT to maximize independence and safety. Limited eval performed due to pt's meal came and only 1 person assistance available. Will defer making dc recommendation until seen again later today for more mobility.           Assistance Recommended at Discharge Frequent or constant Supervision/Assistance  If plan is discharge home, recommend the following:  Can travel by private vehicle  A lot of help with walking and/or transfers;A lot of help with bathing/dressing/bathroom;Assistance with cooking/housework;Assist for transportation;Help with stairs or ramp for entrance        Equipment Recommendations Other (comment) (To be determined)  Recommendations for Other Services       Functional Status Assessment Patient has had a recent decline in their functional status and demonstrates the ability to make significant improvements in function in a reasonable and predictable amount of time.     Precautions / Restrictions Precautions Precautions: Fall;Sternal;Other (comment) Precaution  Comments: sores bilateral feet/legs, watch HR/O2      Mobility  Bed Mobility               General bed mobility comments: Placed bed in chair poosition with good tolerance    Transfers                        Ambulation/Gait                  Stairs            Wheelchair Mobility     Tilt Bed    Modified Rankin (Stroke Patients Only)       Balance                                             Pertinent Vitals/Pain Pain Assessment Pain Assessment: Faces Faces Pain Scale: Hurts even more Pain Location: chest incision Pain Descriptors / Indicators: Operative site guarding, Grimacing Pain Intervention(s): Limited activity within patient's tolerance, Monitored during session, Repositioned    Home Living Family/patient expects to be discharged to:: Private residence Living Arrangements: Spouse/significant other Available Help at Discharge: Family;Available PRN/intermittently Type of Home: House Home Access: Stairs to enter Entrance Stairs-Rails: None Entrance Stairs-Number of Steps: 2   Home Layout: One level Home Equipment: Agricultural consultant (2 wheels);Electric scooter;BSC/3in1;Cane - single point;Tub bench;Grab bars - tub/shower Additional Comments: adjustable bed    Prior Function Prior Level of Function : Needs assist             Mobility Comments: Amb with rolling walker or  uses Tree surgeon Dominance   Dominant Hand: Left    Extremity/Trunk Assessment   Upper Extremity Assessment Upper Extremity Assessment: Defer to OT evaluation    Lower Extremity Assessment Lower Extremity Assessment: Generalized weakness;RLE deficits/detail;LLE deficits/detail RLE Deficits / Details: transmetatarsal amputation LLE Deficits / Details: transmetatarsal amputation       Communication   Communication: No difficulties  Cognition Arousal/Alertness: Awake/alert Behavior During Therapy: WFL for tasks  assessed/performed Overall Cognitive Status: Within Functional Limits for tasks assessed                                          General Comments General comments (skin integrity, edema, etc.): VSS on 10L HFNC    Exercises General Exercises - Upper Extremity Shoulder Flexion: AAROM, Both, 10 reps, Supine General Exercises - Lower Extremity Heel Slides: AAROM, Both, 5 reps, Supine Straight Leg Raises: AAROM, Both, 5 reps, Supine   Assessment/Plan    PT Assessment Patient needs continued PT services  PT Problem List Decreased strength;Decreased activity tolerance;Decreased balance;Decreased mobility;Pain       PT Treatment Interventions DME instruction;Gait training;Functional mobility training;Therapeutic activities;Therapeutic exercise;Balance training;Patient/family education    PT Goals (Current goals can be found in the Care Plan section)  Acute Rehab PT Goals PT Goal Formulation: With patient Time For Goal Achievement: 05/28/23 Potential to Achieve Goals: Fair    Frequency Min 1X/week     Co-evaluation               AM-PAC PT "6 Clicks" Mobility  Outcome Measure Help needed turning from your back to your side while in a flat bed without using bedrails?: A Lot Help needed moving from lying on your back to sitting on the side of a flat bed without using bedrails?: Total Help needed moving to and from a bed to a chair (including a wheelchair)?: Total Help needed standing up from a chair using your arms (e.g., wheelchair or bedside chair)?: Total Help needed to walk in hospital room?: Total Help needed climbing 3-5 steps with a railing? : Total 6 Click Score: 7    End of Session Equipment Utilized During Treatment: Oxygen Activity Tolerance: Patient tolerated treatment well Patient left: in bed;with call bell/phone within reach;with family/visitor present   PT Visit Diagnosis: Other abnormalities of gait and mobility (R26.89);Muscle weakness  (generalized) (M62.81);Difficulty in walking, not elsewhere classified (R26.2);Pain Pain - part of body:  (chest incision)    Time: 1610-9604 PT Time Calculation (min) (ACUTE ONLY): 16 min   Charges:   PT Evaluation $PT Eval Moderate Complexity: 1 Mod   PT General Charges $$ ACUTE PT VISIT: 1 Visit         [facility] PT Acute Rehabilitation Services Office [phone]   Angelina Ok [facility] [place] 05/14/2023, 2:36 PM

## 2023-05-14 NOTE — Progress Notes (Addendum)
[facility] DAILY ICU PROGRESS NOTE                   [address]            [place] [address]          [phone]   3 Days Post-Op Procedure(s) (LRB): VENTRICULAR SEPTAL DEFECT (VSD) REPAIR (N/A) TRANSESOPHAGEAL ECHOCARDIOGRAM (N/A)  Total Length of Stay:  LOS: 10 days   Subjective: Events late last evening noted (coughing, unresponsive, hypoxic, bradycardic). She is on bi pap this am. She does have nausea  Objective: Vital signs in last 24 hours: Temp:  [98.2 F (36.8 C)-99 F (37.2 C)] 99 F (37.2 C) (07/21 2300) Pulse Rate:  [25-112] 61 (07/21 1730) Cardiac Rhythm: Normal sinus rhythm (07/21 2300) Resp:  [9-28] 13 (07/22 0753) BP: (63-172)/(49-142) 101/75 (07/22 0700) SpO2:  [68 %-100 %] 100 % (07/22 0753) Arterial Line BP: (121-137)/(103-127) 128/107 (07/21 1030) FiO2 (%):  [60 %-100 %] 60 % (07/22 0753) Weight:  [108.5 kg] 108.5 kg (07/22 0500)  Filed Weights   05/12/23 0600 05/13/23 0500 05/14/23 0500  Weight: 109.2 kg 108.6 kg 108.5 kg    Weight change: -0.1 kg   Hemodynamic parameters for last 24 hours: PAP: (40-64)/(24-50) 55/43 CVP:  [15 mmHg-33 mmHg] 24 mmHg  Intake/Output from previous day: 07/21 0701 - 07/22 0700 In: 1384 [I.V.:1316.1; IV Piggyback:67.9] Out: 2550 [Urine:2300; Chest Tube:250]  Intake/Output this shift: Total I/O In: -  Out: 900 [Urine:900]  Current Meds: Scheduled Meds:  acetaminophen  1,000 mg Oral Q6H   Or   acetaminophen (TYLENOL) oral liquid 160 mg/5 mL  1,000 mg Per Tube Q6H   amitriptyline  50 mg Oral QHS   bisacodyl  10 mg Oral Daily   Or   bisacodyl  10 mg Rectal Daily   Chlorhexidine Gluconate Cloth  6 each Topical Daily   clopidogrel  75 mg Oral Daily   docusate sodium  200 mg Oral Daily   DULoxetine  60 mg Oral BID   gabapentin  300 mg Oral BID   levothyroxine  25 mcg Oral Daily   linaclotide  145 mcg Oral QAC breakfast   metolazone  2.5 mg Oral Once   mouth rinse  15 mL Mouth Rinse 4 times  per day   pantoprazole  40 mg Oral Daily   rosuvastatin  40 mg Oral Daily   Continuous Infusions:  sodium chloride     sodium chloride     amiodarone 30 mg/hr (05/14/23 0700)   furosemide (LASIX) 200 mg in dextrose 5 % 100 mL (2 mg/mL) infusion 10 mg/hr (05/14/23 0700)   insulin 4.6 Units/hr (05/14/23 0700)   milrinone 0.375 mcg/kg/min (05/14/23 0700)   norepinephrine (LEVOPHED) Adult infusion 4 mcg/min (05/14/23 0700)   vasopressin 0.04 Units/min (05/14/23 0700)   PRN Meds:.sodium chloride, dextrose, HYDROmorphone, lidocaine, methocarbamol, metoprolol tartrate, morphine injection, ondansetron (ZOFRAN) IV, mouth rinse, oxyCODONE, traMADol  General appearance: alert and cooperative Neurologic: intact Heart: RRR Lungs: Coarse breath sounds Abdomen: Soft, non tender, obese, sporadic  bowel sounds  Extremities: SCDs /boots in place Wound: Prevena wound vac on sternum  Lab Results: CBC: Recent Labs    05/13/23 0908 05/13/23 2259 05/14/23 0523  WBC 18.0*  --  18.0*  HGB 7.3* 8.8* 7.6*  HCT 23.4* 26.0* 23.7*  PLT 105*  --  146*   BMET:  Recent Labs    05/13/23 1727 05/13/23 2259 05/14/23 0523  NA 129* 130* 131*  K  4.3 4.5 4.3  CL 89*  --  90*  CO2 27  --  29  GLUCOSE 235*  --  165*  BUN 15  --  17  CREATININE 1.47*  --  1.60*  CALCIUM 7.5*  --  7.7*    CMET: Lab Results  Component Value Date   WBC 18.0 (H) 05/14/2023   HGB 7.6 (L) 05/14/2023   HCT 23.7 (L) 05/14/2023   PLT 146 (L) 05/14/2023   GLUCOSE 165 (H) 05/14/2023   CHOL 165 05/04/2023   TRIG 401 (H) 05/04/2023   HDL 36 (L) 05/04/2023   LDLDIRECT 65 05/04/2023   LDLCALC UNABLE TO CALCULATE IF TRIGLYCERIDE OVER 400 mg/dL 16/07/9603   ALT 15 54/06/8118   AST 51 (H) 05/11/2023   NA 131 (L) 05/14/2023   K 4.3 05/14/2023   CL 90 (L) 05/14/2023   CREATININE 1.60 (H) 05/14/2023   BUN 17 05/14/2023   CO2 29 05/14/2023   TSH 4.210 01/04/2023   INR 1.5 (H) 05/11/2023   HGBA1C 7.6 (H) 05/04/2023       PT/INR:  Recent Labs    05/11/23 1441  LABPROT 18.5*  INR 1.5*   Radiology: DG CHEST PORT 1 VIEW  Result Date: 05/13/2023 CLINICAL DATA:  Hypoxia. EXAM: PORTABLE CHEST 1 VIEW COMPARISON:  Chest radiograph dated 05/13/2023. FINDINGS: Interval removal of the Swan-Ganz catheter. Additional support lines as seen on the prior radiograph. Overall slight worsening of the vascular congestion since the prior radiograph. Similar left pleural effusion and left lung base atelectasis or infiltrate. No pneumothorax. Stable cardiomegaly. Median sternotomy wires. No acute osseous pathology. IMPRESSION: 1. Interval removal of the Swan-Ganz catheter. 2. Slight worsening of the vascular congestion. 3. Similar left pleural effusion and left lung base atelectasis or infiltrate. Electronically Signed   By: Elgie Collard M.D.   On: 05/13/2023 22:47   DG CHEST PORT 1 VIEW  Result Date: 05/13/2023 CLINICAL DATA:  [id number] VSD (ventricular septal defect) [id number] Encounter for central line placement [id number] EXAM: PORTABLE CHEST - 1 VIEW COMPARISON:  the previous day's study FINDINGS: Patient has been extubated and the gastric tube removed. Left IJ sheath, left arm PICC, and right IJ Swan-Ganz stable position. Mediastinal drains stable. Low lung volumes with slightly improved right lung aeration but some worsening of left basilar consolidation/atelectasis. Mild cardiomegaly stable. Sternotomy wires. Small left effusion. IMPRESSION: 1. Interval extubation and removal of gastric tube. 2. Slightly improved right lung aeration but worsening left basilar consolidation/atelectasis. Electronically Signed   By: Corlis Leak M.D.   On: 05/13/2023 09:38     Assessment/Plan: S/P Procedure(s) (LRB): VENTRICULAR SEPTAL DEFECT (VSD) REPAIR (N/A) TRANSESOPHAGEAL ECHOCARDIOGRAM (N/A)  CV-Cardiogenic shock, anterior STEMI. On Nor epinephrine 4,  Milrinone 0.375, and Vasopressin 0.04  drips. Co ox this am 71. On DAPT (Plavix.and  ec asa). She will be restarted on Apixaban at some point Pulmonary-Chest tubes with 250 cc. History of OSA. FiO2% 60, bi pap. Hope to remove chest tubes soon? Later today if output this am remains low. Will check CXR  in am. Acute heart failure-CVP 15 this am. On Metolazone 2.5 mg daily and Lasix drip. AHF following. Expected post op blood loss anemia-H and H this am 7.6 and 23.7 5. DM-CBGs 156/143/142. Pre op HGA1C 7.6. She has a diabetic foot ulcer. 6. AKI-Creatinine this am slightly increased to 1.6 7.  History of hypothyroidism-continue Levothyroxine 8. Thrombocytopenia improving-platelets this am up to 146,000. 9. GI-Zofran PRN. Will give a few doses of scheduled  Reglan.  Stacey Margaretann Loveless PA-C 05/14/2023 8:11 AM  Patient examined and today's and chest x-ray personally reviewed. Pulmonary status improving and she is transition from BiPAP to high flow oxygen with adequate saturation. Lasix drip has been increased to 10 mg/h for fluid retention. Chest x-ray shows low lung volumes due to chest wall pain and body habitus.  patient examined and medical record reviewed,agree with above note. Lovett Sox 05/14/2023

## 2023-05-15 ENCOUNTER — Inpatient Hospital Stay (HOSPITAL_COMMUNITY): Payer: BC Managed Care – PPO

## 2023-05-15 DIAGNOSIS — R570 Cardiogenic shock: Secondary | ICD-10-CM | POA: Diagnosis not present

## 2023-05-15 DIAGNOSIS — I232 Ventricular septal defect as current complication following acute myocardial infarction: Secondary | ICD-10-CM | POA: Diagnosis not present

## 2023-05-15 DIAGNOSIS — J810 Acute pulmonary edema: Secondary | ICD-10-CM | POA: Diagnosis not present

## 2023-05-15 DIAGNOSIS — I2102 ST elevation (STEMI) myocardial infarction involving left anterior descending coronary artery: Secondary | ICD-10-CM | POA: Diagnosis not present

## 2023-05-15 LAB — CBC
HCT: 24.5 % — ABNORMAL LOW (ref 36.0–46.0)
Hemoglobin: 8 g/dL — ABNORMAL LOW (ref 12.0–15.0)
MCH: 28.9 pg (ref 26.0–34.0)
MCHC: 32.7 g/dL (ref 30.0–36.0)
MCV: 88.4 fL (ref 80.0–100.0)
Platelets: 226 10*3/uL (ref 150–400)
RBC: 2.77 MIL/uL — ABNORMAL LOW (ref 3.87–5.11)
RDW: 15.1 % (ref 11.5–15.5)
WBC: 13.1 10*3/uL — ABNORMAL HIGH (ref 4.0–10.5)
nRBC: 0.2 % (ref 0.0–0.2)

## 2023-05-15 LAB — GLUCOSE, CAPILLARY
Glucose-Capillary: 168 mg/dL — ABNORMAL HIGH (ref 70–99)
Glucose-Capillary: 175 mg/dL — ABNORMAL HIGH (ref 70–99)
Glucose-Capillary: 179 mg/dL — ABNORMAL HIGH (ref 70–99)
Glucose-Capillary: 182 mg/dL — ABNORMAL HIGH (ref 70–99)
Glucose-Capillary: 199 mg/dL — ABNORMAL HIGH (ref 70–99)
Glucose-Capillary: 266 mg/dL — ABNORMAL HIGH (ref 70–99)
Glucose-Capillary: 272 mg/dL — ABNORMAL HIGH (ref 70–99)

## 2023-05-15 LAB — MAGNESIUM: Magnesium: 1.9 mg/dL (ref 1.7–2.4)

## 2023-05-15 LAB — COOXEMETRY PANEL
Carboxyhemoglobin: 1 % (ref 0.5–1.5)
Carboxyhemoglobin: 1.3 % (ref 0.5–1.5)
Methemoglobin: <0.7 % (ref 0.0–1.5)
Methemoglobin: 1.6 % — ABNORMAL HIGH (ref 0.0–1.5)
O2 Saturation: 98.7 %
O2 Saturation: 63.4 %
Total hemoglobin: 8.3 g/dL — ABNORMAL LOW (ref 12.0–16.0)
Total hemoglobin: 7.8 g/dL — ABNORMAL LOW (ref 12.0–16.0)

## 2023-05-15 LAB — BASIC METABOLIC PANEL
Anion gap: 11 (ref 5–15)
BUN: 18 mg/dL (ref 6–20)
CO2: 35 mmol/L — ABNORMAL HIGH (ref 22–32)
Calcium: 8 mg/dL — ABNORMAL LOW (ref 8.9–10.3)
Chloride: 87 mmol/L — ABNORMAL LOW (ref 98–111)
Creatinine, Ser: 1.55 mg/dL — ABNORMAL HIGH (ref 0.44–1.00)
GFR, Estimated: 39 mL/min — ABNORMAL LOW (ref >60)
Glucose, Bld: 197 mg/dL — ABNORMAL HIGH (ref 70–99)
Potassium: 3 mmol/L — ABNORMAL LOW (ref 3.5–5.1)
Sodium: 133 mmol/L — ABNORMAL LOW (ref 135–145)

## 2023-05-15 MED ORDER — FUROSEMIDE 40 MG PO TABS
40.0000 mg | ORAL_TABLET | Freq: Every day | ORAL | Status: DC
  Administered 2023-05-15: 40 mg via ORAL
  Filled 2023-05-15 (×2): qty 1

## 2023-05-15 MED ORDER — INSULIN GLARGINE-YFGN 100 UNIT/ML ~~LOC~~ SOLN
12.0000 [IU] | Freq: Two times a day (BID) | SUBCUTANEOUS | Status: DC
  Administered 2023-05-15 – 2023-05-25 (×20): 12 [IU] via SUBCUTANEOUS
  Filled 2023-05-15 (×26): qty 0.12

## 2023-05-15 MED ORDER — ORAL CARE MOUTH RINSE: 15.0000 mL | OROMUCOSAL | Status: DC | PRN

## 2023-05-15 MED ORDER — POTASSIUM CHLORIDE CRYS ER 20 MEQ PO TBCR
40.0000 meq | EXTENDED_RELEASE_TABLET | Freq: Once | ORAL | Status: AC
  Administered 2023-05-15: 40 meq via ORAL
  Filled 2023-05-15: qty 2

## 2023-05-15 MED ORDER — IPRATROPIUM-ALBUTEROL 0.5-2.5 (3) MG/3ML IN SOLN
3.0000 mL | Freq: Four times a day (QID) | RESPIRATORY_TRACT | Status: DC | PRN
  Administered 2023-05-15 – 2023-05-22 (×7): 3 mL via RESPIRATORY_TRACT
  Filled 2023-05-15 (×8): qty 3

## 2023-05-15 MED ORDER — POTASSIUM CHLORIDE CRYS ER 20 MEQ PO TBCR
40.0000 meq | EXTENDED_RELEASE_TABLET | ORAL | Status: AC
  Administered 2023-05-15 (×2): 40 meq via ORAL
  Filled 2023-05-15 (×2): qty 2

## 2023-05-15 NOTE — Evaluation (Signed)
Occupational Therapy Evaluation Patient Details Name: [name] MRN: [id number] DOB: 1966-04-13 Today's Date: 05/15/2023   History of Present Illness Pt is 57 year old presented to [facility] on  05/04/23 for STEMI and underwent PCI to LAD on 7/12. Pt with cardiogenic shock and acute heart failure. Pt continued with chest pain and found to have apical ventral septal defect. Pt underwent rt heart cath and IABP placed on 7/14. Underwent surgical ventral septal defect repair on 7/19. IABP removed and pt extubated 7/20. 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   Clinical Impression   Pt independent at baseline with ADLs, used RW and scooter for mobility. Lives with spouse. Pt educated on sternal precautions, has sternal precautions handout. Pt currently needing min-max A for ADLs, max A +2 for bed mobility and mod A +2 for transfer with Stedy. Pt only placing hands on bar, ensured pt did not pull with arms. Pt able to complete seated grooming task, overall needed min cues to adhere to sternal prec during session. Pt presenting with impairments listed below, will follow acutely. Patient will benefit from intensive inpatient follow up therapy, >3 hours/day to maximize safety/ind with ADLs/mobility prior to d/c.       Recommendations for follow up therapy are one component of a multi-disciplinary discharge planning process, led by the attending physician.  Recommendations may be updated based on patient status, additional functional criteria and insurance authorization.   Assistance Recommended at Discharge Intermittent Supervision/Assistance  Patient can return home with the following Two people to help with walking and/or transfers;A lot of help with bathing/dressing/bathroom;Assistance with  cooking/housework;Assist for transportation;Help with stairs or ramp for entrance    Functional Status Assessment  Patient has had a recent decline in their functional status and demonstrates the ability to make significant improvements in function in a reasonable and predictable amount of time.  Equipment Recommendations  Other (comment) (defer)    Recommendations for Other Services PT consult     Precautions / Restrictions Precautions Precautions: Fall;Sternal;Other (comment) Precaution Booklet Issued: Yes (comment) (provided with sternal prec handout) Precaution Comments: sores bilateral feet/legs, watch HR/O2 Restrictions Weight Bearing Restrictions: Yes (Sternal precautions) Other Position/Activity Restrictions: sternal prec      Mobility Bed Mobility Overal bed mobility: Needs Assistance Bed Mobility: Rolling, Sidelying to Sit, Sit to Supine Rolling: Max assist, +2 for physical assistance              Transfers Overall transfer level: Needs assistance Equipment used: Ambulation equipment used Transfers: Sit to/from Stand Sit to Stand: Mod assist, +2 physical assistance                  Balance Overall balance assessment: Needs assistance Sitting-balance support: No upper extremity supported, Feet supported Sitting balance-Leahy Scale: Fair     Standing balance support: Bilateral upper extremity supported, During functional activity Standing balance-Leahy Scale: Poor                             ADL either performed or assessed with clinical judgement   ADL Overall ADL's : Needs assistance/impaired Eating/Feeding: Minimal assistance;Bed level   Grooming: Brushing hair;Set up;Sitting;Cueing for UE precautions   Upper Body Bathing: Moderate assistance;Sitting;Bed level   Lower Body Bathing: Maximal assistance;Sitting/lateral leans;Bed level   Upper Body Dressing : Moderate assistance;Sitting;Bed level  Lower Body Dressing: Maximal  assistance;Sitting/lateral leans;Bed level   Toilet Transfer: Maximal assistance;+2 for physical assistance           Functional mobility during ADLs: Maximal assistance;+2 for physical assistance       Vision   Vision Assessment?: No apparent visual deficits     Perception Perception Perception Tested?: No   Praxis Praxis Praxis tested?: Not tested    Pertinent Vitals/Pain Pain Assessment Pain Assessment: Faces Pain Score: 4  Faces Pain Scale: Hurts little more Pain Location: chest incision Pain Descriptors / Indicators: Operative site guarding, Grimacing Pain Intervention(s): Limited activity within patient's tolerance, Monitored during session, Repositioned     Hand Dominance Left   Extremity/Trunk Assessment Upper Extremity Assessment Upper Extremity Assessment: Generalized weakness   Lower Extremity Assessment Lower Extremity Assessment: Defer to PT evaluation   Cervical / Trunk Assessment Cervical / Trunk Assessment: Normal   Communication Communication Communication: No difficulties   Cognition Arousal/Alertness: Awake/alert Behavior During Therapy: WFL for tasks assessed/performed Overall Cognitive Status: Within Functional Limits for tasks assessed                                       General Comments  VSS throughout    Exercises     Shoulder Instructions      Home Living Family/patient expects to be discharged to:: Private residence Living Arrangements: Spouse/significant other Available Help at Discharge: Family;Available PRN/intermittently Type of Home: House Home Access: Stairs to enter [employer] of Steps: 2 Entrance Stairs-Rails: None Home Layout: One level     Bathroom Shower/Tub: Producer, television/film/video: Standard Bathroom Accessibility: Yes   Home Equipment: Agricultural consultant (2 wheels);Electric scooter;BSC/3in1;Cane - single point;Tub bench;Grab bars - tub/shower   Additional Comments:  adjustable bed -- but will not be using      Prior Functioning/Environment Prior Level of Function : Needs assist             Mobility Comments: Amb with rolling walker or uses electric scooter ADLs Comments: ind; managed medications, father assists with transportatin        OT Problem List: Decreased strength;Decreased range of motion;Decreased activity tolerance;Impaired balance (sitting and/or standing);Cardiopulmonary status limiting activity;Decreased knowledge of precautions;Impaired UE functional use      OT Treatment/Interventions: Self-care/ADL training;Therapeutic exercise;Energy conservation;DME and/or AE instruction;Therapeutic activities;Patient/family education;Balance training    OT Goals(Current goals can be found in the care plan section) Acute Rehab OT Goals Patient Stated Goal: none stated OT Goal Formulation: With patient Time For Goal Achievement:  Potential to Achieve Goals: Good ADL Goals Pt Will Perform Grooming: with supervision;sitting Pt Will Perform Upper Body Dressing: with min assist;sitting Pt Will Perform Lower Body Dressing: with mod assist;sit to/from stand;sitting/lateral leans Pt Will Transfer to Toilet: with min assist;squat pivot transfer;stand pivot transfer;bedside commode Additional ADL Goal #1: pt will complete bed mobility with min A in prep for ADLs Additional ADL Goal #2: Pt will be able to verbalize and adhere to sternal prec during 2 ADLs with min cues.  OT Frequency: Min 1X/week    Co-evaluation              AM-PAC OT "6 Clicks" Daily Activity     Outcome Measure Help from another person eating meals?: A Little Help from another person taking care of personal grooming?: A Little Help from another person toileting, which includes using toliet, bedpan, or urinal?: A  Lot Help from another person bathing (including washing, rinsing, drying)?: A Lot Help from another person to put on and taking off regular upper body  clothing?: A Lot Help from another person to put on and taking off regular lower body clothing?: A Lot 6 Click Score: 14   End of Session Equipment Utilized During Treatment: Other (comment) (stedy) Nurse Communication: Mobility status  Activity Tolerance: Patient tolerated treatment well Patient left: in bed;with call bell/phone within reach;with bed alarm set  OT Visit Diagnosis: Unsteadiness on feet (R26.81);Other abnormalities of gait and mobility (R26.89);Muscle weakness (generalized) (M62.81)                Time: 1324-4010 OT Time Calculation (min): 34 min Charges:  OT General Charges $OT Visit: 1 Visit OT Evaluation $OT Eval Moderate Complexity: 1 Mod OT Treatments $Self Care/Home Management : 8-22 mins  Carver Fila, OTD, OTR/L SecureChat Preferred Acute Rehab [phone]   Carver Fila Koonce 05/15/2023, 10:37 AM

## 2023-05-15 NOTE — Plan of Care (Signed)
  Problem: Health Behavior/Discharge Planning: Goal: Ability to manage health-related needs will improve Outcome: Progressing   Problem: Clinical Measurements: Goal: Ability to maintain clinical measurements within normal limits will improve Outcome: Progressing Goal: Will remain free from infection Outcome: Progressing Goal: Respiratory complications will improve Outcome: Progressing   Problem: Coping: Goal: Level of anxiety will decrease Outcome: Progressing   Problem: Pain Managment: Goal: General experience of comfort will improve Outcome: Progressing

## 2023-05-15 NOTE — Progress Notes (Addendum)
Patient ID: [name], female   DOB: [birth year], 57 y.o.   MRN: [id number]    Advanced Heart Failure Rounding Note   Subjective:    7/14: RHC mean RA 21, PA 50/25 mean 35, mean PCWP 28, CI 1.8 F/2.6 T, QpQs 3.3.  IABP placed.  7/19: Surgical repair of apical VSD.  7/20: IABP removed, extubated.   Remains on Milrinone 0.375 + NE 4. Vaso off. CO-OX 98%. Recheck.   ~7L UOP yesterday with lasix gtt at 10/hr + 2.5 mg metolazone.  Off BiPAP. O2 stable on 10L HFNC.  Sitting up in bed. No complaints.     Objective:     Vital Signs:   Temp:  [98.2 F (36.8 C)-99.3 F (37.4 C)] 99.3 F (37.4 C) (07/23 0804) Resp:  [11-32] 17 (07/23 0615) BP: (75-132)/(31-119) 104/67 (07/23 0615) SpO2:  [78 %-100 %] 97 % (07/23 0615) FiO2 (%):  [60 %] 60 % (07/22 2241) Last BM Date : 05/14/23  Weight change: Filed Weights   05/12/23 0600 05/13/23 0500 05/14/23 0500  Weight: 109.2 kg 108.6 kg 108.5 kg    Intake/Output:   Intake/Output Summary (Last 24 hours) at 05/15/2023 0845 Last data filed at 05/15/2023 0800 Gross per 24 hour  Intake 993.58 ml  Output 6475 ml  Net -5481.42 ml     Physical Exam: General:  Fatigued appearing. Lying in bed HEENT: normal Neck: supple. no JVD. Carotids 2+ bilat; no bruits.  Cor: PMI nondisplaced. Regular rate & rhythm. No rubs, gallops or murmurs. + wound vac over sternum Lungs: clear Abdomen: obese, soft, nontender, nondistended.  Extremities: no cyanosis, clubbing, rash, trace edema, LUE PICC Neuro: alert & orientedx3. Affect pleasant  Telemetry: SR 70s  Labs: Basic Metabolic Panel: Recent Labs  Lab 05/11/23 1936 05/11/23 1938 05/12/23 0317 05/12/23 0420 05/12/23 1554 05/13/23 0908 05/13/23 1727 05/13/23 2259 05/14/23 0523 05/14/23 0955 05/14/23 1248 05/15/23 0427  NA 128*   < > 131*   < > 130* 128* 129* 130* 131* 129* 130* 133*  K 5.1   < > 3.8   < > 3.2* 3.5 4.3 4.5 4.3 3.4* 3.2* 3.0*  CL 96*  --  95*  --  91* 90* 89*  --  90*   --  88* 87*  CO2 20*  --  25  --  25 27 27   --  29  --  29 35*  GLUCOSE 265*  --  191*  --  151* 126* 235*  --  165*  --  235* 197*  BUN 20  --  17  --  15 14 15   --  17  --  17 18  CREATININE 1.79*  --  1.63*  --  1.59* 1.50* 1.47*  --  1.60*  --  1.68* 1.55*  CALCIUM 7.2*  --  7.4*  --  7.5* 7.8* 7.5*  --  7.7*  --  7.8* 8.0*  MG 3.9*  --  2.9*  --  2.3  --   --   --  2.1  --   --  1.9   < > = values in this interval not displayed.    Liver Function Tests: Recent Labs  Lab 05/11/23 1936  AST 51*  ALT 15  ALKPHOS 64  BILITOT 0.5  PROT 4.3*  ALBUMIN 2.8*    No results for input(s): "LIPASE", "AMYLASE" in the last 168 hours. No results for input(s): "AMMONIA" in the last 168 hours.  CBC: Recent Labs  Lab 05/12/23 0425  05/12/23 1554 05/13/23 0908 05/13/23 2259 05/14/23 0523 05/14/23 0955 05/15/23 0427  WBC 10.8* 16.5* 18.0*  --  18.0*  --  13.1*  NEUTROABS  --   --  12.9*  --   --   --   --   HGB 7.0* 8.2* 7.3* 8.8* 7.6* 8.2* 8.0*  HCT 21.1* 25.4* 23.4* 26.0* 23.7* 24.0* 24.5*  MCV 87.2 87.9 90.0  --  91.9  --  88.4  PLT 93* 103* 105*  --  146*  --  226    Cardiac Enzymes: No results for input(s): "CKTOTAL", "CKMB", "CKMBINDEX", "TROPONINI" in the last 168 hours.  BNP: BNP (last 3 results) Recent Labs    05/04/23 0620  BNP 123.2*    ProBNP (last 3 results) No results for input(s): "PROBNP" in the last 8760 hours.    Other results:  Imaging: DG CHEST PORT 1 VIEW  Result Date: 05/15/2023 CLINICAL DATA:  Ventricular septal defect repair, pleural effusion EXAM: PORTABLE CHEST 1 VIEW COMPARISON:  Previous studies including the examination of 05/13/2023 FINDINGS: Transverse diameter of heart is increased. Increased density in left mid and left lower lung fields may be due to pleural effusion and underlying infiltrates. Central pulmonary vessels are less prominent. There is improvement in aeration in right lung. Mediastinal drains are noted. Tip of PICC line is  noted in the course of superior vena cava. IMPRESSION: Cardiomegaly. There is interval decrease in pulmonary vascular congestion. Homogeneous opacity in left mid and left lower lung fields may be due to pleural effusion and underlying atelectasis/pneumonia. Electronically Signed   By: Ernie Avena M.D.   On: 05/15/2023 08:29   DG CHEST PORT 1 VIEW  Result Date: 05/13/2023 CLINICAL DATA:  Hypoxia. EXAM: PORTABLE CHEST 1 VIEW COMPARISON:  Chest radiograph dated 05/13/2023. FINDINGS: Interval removal of the Swan-Ganz catheter. Additional support lines as seen on the prior radiograph. Overall slight worsening of the vascular congestion since the prior radiograph. Similar left pleural effusion and left lung base atelectasis or infiltrate. No pneumothorax. Stable cardiomegaly. Median sternotomy wires. No acute osseous pathology. IMPRESSION: 1. Interval removal of the Swan-Ganz catheter. 2. Slight worsening of the vascular congestion. 3. Similar left pleural effusion and left lung base atelectasis or infiltrate. Electronically Signed   By: Elgie Collard M.D.   On: 05/13/2023 22:47   DG CHEST PORT 1 VIEW  Result Date: 05/13/2023 CLINICAL DATA:  97360 VSD (ventricular septal defect) [id number] Encounter for central line placement [id number] EXAM: PORTABLE CHEST - 1 VIEW COMPARISON:  the previous day's study FINDINGS: Patient has been extubated and the gastric tube removed. Left IJ sheath, left arm PICC, and right IJ Swan-Ganz stable position. Mediastinal drains stable. Low lung volumes with slightly improved right lung aeration but some worsening of left basilar consolidation/atelectasis. Mild cardiomegaly stable. Sternotomy wires. Small left effusion. IMPRESSION: 1. Interval extubation and removal of gastric tube. 2. Slightly improved right lung aeration but worsening left basilar consolidation/atelectasis. Electronically Signed   By: Corlis Leak M.D.   On: 05/13/2023 09:38     Medications:      Scheduled Medications:  acetaminophen  1,000 mg Oral Q6H   Or   acetaminophen (TYLENOL) oral liquid 160 mg/5 mL  1,000 mg Per Tube Q6H   amitriptyline  50 mg Oral QHS   aspirin EC  81 mg Oral Daily   bisacodyl  10 mg Oral BID   Or   bisacodyl  10 mg Rectal BID   Chlorhexidine Gluconate Cloth  6 each  Topical Daily   clopidogrel  75 mg Oral Daily   docusate sodium  200 mg Oral BID   DULoxetine  60 mg Oral BID   feeding supplement  237 mL Oral BID BM   gabapentin  300 mg Oral BID   insulin aspart  0-9 Units Subcutaneous Q4H   insulin glargine-yfgn  8 Units Subcutaneous BID   levothyroxine  25 mcg Oral Daily   linaclotide  145 mcg Oral QAC breakfast   metoCLOPramide (REGLAN) injection  10 mg Intravenous Q6H   mouth rinse  15 mL Mouth Rinse 4 times per day   pantoprazole  40 mg Oral Daily   rosuvastatin  40 mg Oral Daily    Infusions:  sodium chloride     sodium chloride     amiodarone 30 mg/hr (05/15/23 0800)   milrinone 0.25 mcg/kg/min (05/15/23 0831)   norepinephrine (LEVOPHED) Adult infusion 4 mcg/min (05/15/23 0800)   vasopressin Stopped (05/14/23 1825)    PRN Medications: sodium chloride, dextrose, HYDROmorphone, lidocaine, methocarbamol, metoprolol tartrate, morphine injection, ondansetron (ZOFRAN) IV, mouth rinse, oxyCODONE, traMADol    Patient Profile    Verneda Hollopeter is a 57 y.o. 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 Admitted with STEMI s/p PDI to LAD now in cardiogenic shock.    Assessment/Plan:   Cardiogenic shock -> acute HF with mildly-reduced EF complicated by apical VSD.  - Echo 6/23 EF 55%, no RWMA, normal RV, trivial MR - Echo 7/12,  EF 45% - Echo 7/14: EF 60% with apical VSD, normal RV. Apical VSD creates large shunt by RHC QpQs 3.3.  - TEE 7/15 showed EF 55% with apical akinesis and apical VSD with significant L>R flow, RV mildly dilated  and dysfunctional.   - OR 7/19 for surgical repair of apical VSD.  - Continues on milrinone 0.375 + NE 4. Wean milrinone to 0.25. Recheck CO-OX. - Can stop amiodarone gtt once off inotrope support. - Stop lasix gtt. Start po lasix 40 daily. Supp K and Mag. - Start SGLT2i once foley is out - Eventually will need anticoagulation due to prosthetic material for VSD repair on both sides of interventricular septum at apex.  Start anticoagulation when okay with TCTS.  Eventual regimen will be Plavix + apixaban.    Anterior STEMI, CAD - HsTrop >24K on admission - s/p PCI to LAD 7/12, post DES 0% residual stenosis. Medical management of 40% stenosed RCA.  - LDL unable to be calculated with high TGD, continue Crestor 40.  - Complicated by infarct-related VSD.  - On DAPT with ASA and plavix until apixaban started. He will be on Plavix + apixaban as home regimen.   3. Post-MI chest pain -possible pericarditis - Chest pain resolved.    DM II - A1c last 7.6 - SSI while admitted per primary - has diabetic foot ulcer, WOC has seen - ABI 12/22 were normal   Obesity - Body mass index is 37.76 kg/m.  - consider GLP-1 as OP   OSA - does not use CPAP   HLD - Continue statin  7. AKI - Scr stable 1.55 today - Follow  8. Hyponatremia - Hypervolemic hyponatremia.  - Na 133 this am, improved with diuresis - Fluid restrict.   9. Anemia - Hgb 8.0 this am - 1 u RBCs on 07/20 - Transfuse Hgb < 7  10. Thrombocytopenia - IABP now out - Resolved  11. Acute hypoxemic  respiratory failure - Now off BiPAP - O2 stable on 10L HFNC - Encourage IS  Out of bed today  Considering CIR once ready for discharge.  FINCH, LINDSAY N 05/15/2023 8:45 AM  Agree with above.   Feeling better today.  Remains on milrinone 0.375 and NE 4. Off VP. On lasix gtt with good u/o.   Had good BM. Able to stand yesterday. CTs still in. Remains in NSR  General: Sitting up in bed No resp difficulty HEENT:  normal Neck: supple. no JVD. Carotids 2+ bilat; no bruits. No lymphadenopathy or thryomegaly appreciated. Cor: PMI nondisplaced. Regular rate & rhythm. No rubs, gallops or murmurs. Lungs: clear Abdomen: soft, nontender, nondistended. No hepatosplenomegaly. No bruits or masses. Good bowel sounds. Extremities: no cyanosis, clubbing, rash, edema + transmet amp Neuro: alert & orientedx3, cranial nerves grossly intact. moves all 4 extremities w/o difficulty. Affect pleasant  Had good day yesterday with good diuresis. Looks brighter today. Drop milrinone to 0.25. Wean NE. Switch diuretics to po. Hopefully CTs out today. Continue Plavix. Switch amio to po.   Mobilize. Encouraged IS.   CRITICAL CARE Performed by: Arvilla Meres  Total critical care time: 45 minutes  Critical care time was exclusive of separately billable procedures and treating other patients.  Critical care was necessary to treat or prevent imminent or life-threatening deterioration.  Critical care was time spent personally by me (independent of midlevel providers or residents) on the following activities: development of treatment plan with patient and/or surrogate as well as nursing, discussions with consultants, evaluation of patient's response to treatment, examination of patient, obtaining history from patient or surrogate, ordering and performing treatments and interventions, ordering and review of laboratory studies, ordering and review of radiographic studies, pulse oximetry and re-evaluation of patient's condition.  Arvilla Meres, MD  9:09 AM

## 2023-05-15 NOTE — Progress Notes (Signed)
      [address]       [place] [id number]             [phone]                 4 Days Post-Op Procedure(s) (LRB): VENTRICULAR SEPTAL DEFECT (VSD) REPAIR (N/A) TRANSESOPHAGEAL ECHOCARDIOGRAM (N/A)   Events: No events _______________________________________________________________ Vitals: BP 104/67   Pulse 61   Temp 98.2 F (36.8 C) (Oral)   Resp 17   Ht 5\' 4"  (1.626 m)   Wt 108.5 kg   SpO2 97%   BMI 41.06 kg/m  Filed Weights   05/12/23 0600 05/13/23 0500 05/14/23 0500  Weight: 109.2 kg 108.6 kg 108.5 kg     - Neuro: alert NAD  - Cardiovascular: sinus  Drips: milr 0.375, amio, levo 4, lasix 10.   CVP:  [3 mmHg-33 mmHg] 5 mmHg  - Pulm: EWOB FiO2 (%):  [60 %] 60 % PEEP:  [6 cmH20] 6 cmH20 Pressure Support:  [10 cmH20] 10 cmH20  ABG    Component Value Date/Time   PHART 7.417 05/14/2023 0955   PCO2ART 49.4 (H) 05/14/2023 0955   PO2ART 116 (H) 05/14/2023 0955   HCO3 32.0 (H) 05/14/2023 0955   TCO2 33 (H) 05/14/2023 0955   ACIDBASEDEF 3.0 (H) 05/13/2023 2259   O2SAT 98.7 05/15/2023 0427    - Abd: ND - Extremity: warm  .Intake/Output      07/22 0701 07/23 0700 07/23 0701 07/24 0700   I.V. (mL/kg) 973.6 (9)    IV Piggyback     Total Intake(mL/kg) 973.6 (9)    Urine (mL/kg/hr) 6875 (2.6)    Drains 0    Chest Tube 120    Total Output 6995    Net -6021.4            _______________________________________________________________ Labs:    Latest Ref Rng & Units 05/15/2023    4:27 AM 05/14/2023    9:55 AM 05/14/2023    5:23 AM  CBC  WBC 4.0 - 10.5 K/uL 13.1   18.0   Hemoglobin 12.0 - 15.0 g/dL 8.0  8.2  7.6   Hematocrit 36.0 - 46.0 % 24.5  24.0  23.7   Platelets 150 - 400 K/uL 226   146       Latest Ref Rng & Units 05/15/2023    4:27 AM 05/14/2023   12:48 PM 05/14/2023    9:55 AM  CMP  Glucose 70 - 99 mg/dL 416  606    BUN 6 - 20 mg/dL 18  17    Creatinine 3.01 - 1.00 mg/dL 6.01  0.93    Sodium 235 - 145 mmol/L 133  130   129   Potassium 3.5 - 5.1 mmol/L 3.0  3.2  3.4   Chloride 98 - 111 mmol/L 87  88    CO2 22 - 32 mmol/L 35  29    Calcium 8.9 - 10.3 mg/dL 8.0  7.8      CXR: stable  _______________________________________________________________  Assessment and Plan: POD 4 s/p VSD repair  Neuro: pain controlled CV: appreciate heart failure team.  Wean gtts per their recs.  Wires out.  On plavix for stent Pulm: IS, ambulation Renal: on lasix gtt.  Watching creat GI: on diet Heme: stable ID: afebrile Endo: SSI Dispo: continue ICU care   Corliss Skains 05/15/2023 7:51 AM

## 2023-05-15 NOTE — Progress Notes (Signed)
NAME:  [name], MRN:  [id number], DOB:  Jan 03, 1966, LOS: 11 ADMISSION DATE:  05/04/2023, CONSULTATION DATE: 05/11/2023 REFERRING MD: Dr. Cliffton Asters, CHIEF COMPLAINT: Chest pain  History of Present Illness:  57 year old female with a diabetes melitis, complicated with neuropathy and Wegener's granulomatosis who was initially admitted with acute anterior STEMI underwent PCI to LAD, who continues to require vasopressor support and IABP, echocardiogram showed mechanical complication including apical VSD, underwent VSD repair, remained intubated, on multiple vasopressor support, PCCM was consulted for help evaluation medical management  Pertinent  Medical History   Past Medical History:  Diagnosis Date   Chronic cough    Chronic pain    Diabetes mellitus without complication (HCC)    Dyspnea    GERD (gastroesophageal reflux disease)    Hypokalemia    Hypothyroidism    Left wrist pain 06/01/2021   Myonecrosis (HCC) 07/01/2021   Neurocardiogenic syncope    OSA (obstructive sleep apnea)    does not use CPAP   Osteomyelitis (HCC)    bilateral feet   Peripheral neuropathy    Presence of intrathecal pump    recieves Prialt/bupivicaine   Tear of gluteus medius tendon 07/01/2021   Wegener's granulomatosis 2009   Wegner's disease (congenital syphilitic osteochondritis)      Significant Hospital Events: Including procedures, antibiotic start and stop dates in addition to other pertinent events     Interim History / Subjective:  Wore BiPAP overnight, currently off Oxygen requirement is coming down, currently on 5 L, able to maintain O2 sat 92% Encourage incentive spirometry Vasopressin was titrated off Remain on milrinone and norepinephrine She is afebrile   Objective   Blood pressure 117/69, pulse 79, temperature 99.3 F (37.4 C), temperature source Oral, resp. rate 18, height 5\' 4"  (1.626 m), weight 108.5 kg, SpO2 98%. CVP:  [3 mmHg-33 mmHg] 6 mmHg  FiO2 (%):  [60 %] 60 % PEEP:  [6  cmH20] 6 cmH20 Pressure Support:  [10 cmH20] 10 cmH20   Intake/Output Summary (Last 24 hours) at 05/15/2023 6433 Last data filed at 05/15/2023 0800 Gross per 24 hour  Intake 993.59 ml  Output 6475 ml  Net -5481.41 ml   Filed Weights   05/12/23 0600 05/13/23 0500 05/14/23 0500  Weight: 109.2 kg 108.6 kg 108.5 kg    Examination: Physical exam: General: Chronically ill-appearing middle-age morbidly obese female, lying on the bed HEENT: [place]/AT, eyes anicteric.  moist mucus membranes Neuro: Alert, awake following commands Chest: Reduced air entry at the bases bilaterally, no wheezes or rhonchi Heart: Regular rate and rhythm, no murmurs or gallops Abdomen: Soft, nontender, nondistended, bowel sounds present Skin: No rash   Labs and images were reviewed Coox 63%, Na 133, K 3.0, Cr 1.55 Hb 8, PLT 226  Urine output 7 L in last 24 hours with net -6 L  Resolved Hospital Problem list   Thrombocytopenia  Assessment & Plan:  Coronary artery disease, presented with acute anterior STEMI, complicated with apical VSD. Apical VSD s/p surgical repair S/p PCI to LAD with DES  Currently on DAPT, plan is to have Plavix with apixaban in the next few days Continue statin Chest tube management per  TCTS Continue pain control with tramadol, oxycodone and Dilaudid Chest tube output was 120 cc in last 24 hours  Acute HFrEF with cardiogenic shock Patient ejection fraction is 45% Dobutamine and vasopressin titrated off Remain on, low-dose norepinephrine and milrinone, continue to titrate with MAP goal 65 Coox 63% IABP was discontinued on 7/20 Advanced heart  failure team is following Lasix infusion was stopped Continue 40 mg Lasix daily  Acute respiratory failure with hypoxia/hypercapnia, due to pulmonary edema Mucous plugging Wore BiPAP overnight This morning she was on 10 L oxygen via seltzer, currently down to 5 L Encourage incentive spirometry and ambulation  Hyperlipidemia Continue  Crestor  Acute kidney injury due to ischemic ATN Hyponatremia Serum creatinine is a stable, slightly up from 1.5-1.6 She has really good urine output of 7 L on Lasix infusion Avoid nephrotoxic agent Closely monitor electrolytes Serum sodium remained at 133  Diabetes type 2 Patient hemoglobin A1c is 7.6 Continue sliding scale insulin and Semglee  Expected perioperative blood loss anemia Patient hemoglobin at 7.3 and platelet count improved to 226 S/p1 unit PRBC Monitor H&H and platelet count  Morbid obesity Diet and exercise counseling provided  Best Practice (right click and "Reselect all SmartList Selections" daily)   Diet/type: Clear liquid diet, advance per recommendation from SLP DVT prophylaxis: SCD GI prophylaxis: PPI Lines: Central line, yes still needed  Foley:  Yes, and it is still needed Code Status:  full code Last date of multidisciplinary goals of care discussion [Per primary team]  Labs   CBC: Recent Labs  Lab 05/12/23 0425 05/12/23 1554 05/13/23 0908 05/13/23 2259 05/14/23 0523 05/14/23 0955 05/15/23 0427  WBC 10.8* 16.5* 18.0*  --  18.0*  --  13.1*  NEUTROABS  --   --  12.9*  --   --   --   --   HGB 7.0* 8.2* 7.3* 8.8* 7.6* 8.2* 8.0*  HCT 21.1* 25.4* 23.4* 26.0* 23.7* 24.0* 24.5*  MCV 87.2 87.9 90.0  --  91.9  --  88.4  PLT 93* 103* 105*  --  146*  --  226    Basic Metabolic Panel: Recent Labs  Lab 05/11/23 1936 05/11/23 1938 05/12/23 0317 05/12/23 0420 05/12/23 1554 05/13/23 0908 05/13/23 1727 05/13/23 2259 05/14/23 0523 05/14/23 0955 05/14/23 1248 05/15/23 0427  NA 128*   < > 131*   < > 130* 128* 129* 130* 131* 129* 130* 133*  K 5.1   < > 3.8   < > 3.2* 3.5 4.3 4.5 4.3 3.4* 3.2* 3.0*  CL 96*  --  95*  --  91* 90* 89*  --  90*  --  88* 87*  CO2 20*  --  25  --  25 27 27   --  29  --  29 35*  GLUCOSE 265*  --  191*  --  151* 126* 235*  --  165*  --  235* 197*  BUN 20  --  17  --  15 14 15   --  17  --  17 18  CREATININE 1.79*  --   1.63*  --  1.59* 1.50* 1.47*  --  1.60*  --  1.68* 1.55*  CALCIUM 7.2*  --  7.4*  --  7.5* 7.8* 7.5*  --  7.7*  --  7.8* 8.0*  MG 3.9*  --  2.9*  --  2.3  --   --   --  2.1  --   --  1.9   < > = values in this interval not displayed.   GFR: Estimated Creatinine Clearance: 48.2 mL/min (A) (by C-G formula based on SCr of 1.55 mg/dL (H)). Recent Labs  Lab 05/12/23 1554 05/13/23 0908 05/14/23 0523 05/15/23 0427  WBC 16.5* 18.0* 18.0* 13.1*    Liver Function Tests: Recent Labs  Lab 05/11/23 1936  AST 51*  ALT 15  ALKPHOS 64  BILITOT 0.5  PROT 4.3*  ALBUMIN 2.8*   No results for input(s): "LIPASE", "AMYLASE" in the last 168 hours. No results for input(s): "AMMONIA" in the last 168 hours.  ABG    Component Value Date/Time   PHART 7.417 05/14/2023 0955   PCO2ART 49.4 (H) 05/14/2023 0955   PO2ART 116 (H) 05/14/2023 0955   HCO3 32.0 (H) 05/14/2023 0955   TCO2 33 (H) 05/14/2023 0955   ACIDBASEDEF 3.0 (H) 05/13/2023 2259   O2SAT 98.7 05/15/2023 0427     Coagulation Profile: Recent Labs  Lab 05/11/23 1441  INR 1.5*    Cardiac Enzymes: No results for input(s): "CKTOTAL", "CKMB", "CKMBINDEX", "TROPONINI" in the last 168 hours.  HbA1C: HbA1c, POC (controlled diabetic range)  Date/Time Value Ref Range Status  04/03/2023 05:04 PM 7.5 (A) 0.0 - 7.0 % Final   Hgb A1c MFr Bld  Date/Time Value Ref Range Status  05/04/2023 06:20 AM 7.6 (H) 4.8 - 5.6 % Final    Comment:    (NOTE)         Prediabetes: 5.7 - 6.4         Diabetes: >6.4         Glycemic control for adults with diabetes: <7.0   01/04/2023 01:12 PM 7.6 (H) 4.8 - 5.6 % Final    Comment:             Prediabetes: 5.7 - 6.4          Diabetes: >6.4          Glycemic control for adults with diabetes: <7.0     CBG: Recent Labs  Lab 05/14/23 1739 05/14/23 2005 05/15/23 0018 05/15/23 0431 05/15/23 0820  GLUCAP 151* 225* 199* 182* 272*    Critical care time:      The patient is critically ill due to  cardiogenic shock.  Critical care was necessary to treat or prevent imminent or life-threatening deterioration.  Critical care was time spent personally by me on the following activities: development of treatment plan with patient and/or surrogate as well as nursing, discussions with consultants, evaluation of patient's response to treatment, examination of patient, obtaining history from patient or surrogate, ordering and performing treatments and interventions, ordering and review of laboratory studies, ordering and review of radiographic studies, pulse oximetry, re-evaluation of patient's condition and participation in multidisciplinary rounds.   During this encounter critical care time was devoted to patient care services described in this note for 32 minutes.     Cheri Fowler, MD [facility] See Amion for pager If no response to pager, please call [phone] until 7pm After 7pm, Please call E-link [phone]

## 2023-05-16 ENCOUNTER — Inpatient Hospital Stay (HOSPITAL_COMMUNITY): Payer: BC Managed Care – PPO

## 2023-05-16 DIAGNOSIS — I232 Ventricular septal defect as current complication following acute myocardial infarction: Secondary | ICD-10-CM | POA: Diagnosis not present

## 2023-05-16 DIAGNOSIS — I2102 ST elevation (STEMI) myocardial infarction involving left anterior descending coronary artery: Secondary | ICD-10-CM | POA: Diagnosis not present

## 2023-05-16 DIAGNOSIS — J9601 Acute respiratory failure with hypoxia: Secondary | ICD-10-CM | POA: Diagnosis not present

## 2023-05-16 DIAGNOSIS — R570 Cardiogenic shock: Secondary | ICD-10-CM | POA: Diagnosis not present

## 2023-05-16 LAB — CBC
HCT: 25.8 % — ABNORMAL LOW (ref 36.0–46.0)
Hemoglobin: 8.1 g/dL — ABNORMAL LOW (ref 12.0–15.0)
MCH: 28.5 pg (ref 26.0–34.0)
MCHC: 31.4 g/dL (ref 30.0–36.0)
MCV: 90.8 fL (ref 80.0–100.0)
Platelets: 299 10*3/uL (ref 150–400)
RBC: 2.84 MIL/uL — ABNORMAL LOW (ref 3.87–5.11)
RDW: 14.7 % (ref 11.5–15.5)
WBC: 11.3 10*3/uL — ABNORMAL HIGH (ref 4.0–10.5)
nRBC: 0 % (ref 0.0–0.2)

## 2023-05-16 LAB — BASIC METABOLIC PANEL
Anion gap: 9 (ref 5–15)
Anion gap: 10 (ref 5–15)
BUN: 17 mg/dL (ref 6–20)
BUN: 16 mg/dL (ref 6–20)
CO2: 39 mmol/L — ABNORMAL HIGH (ref 22–32)
CO2: 34 mmol/L — ABNORMAL HIGH (ref 22–32)
Calcium: 8 mg/dL — ABNORMAL LOW (ref 8.9–10.3)
Calcium: 7.9 mg/dL — ABNORMAL LOW (ref 8.9–10.3)
Chloride: 87 mmol/L — ABNORMAL LOW (ref 98–111)
Chloride: 86 mmol/L — ABNORMAL LOW (ref 98–111)
Creatinine, Ser: 1.42 mg/dL — ABNORMAL HIGH (ref 0.44–1.00)
Creatinine, Ser: 1.5 mg/dL — ABNORMAL HIGH (ref 0.44–1.00)
GFR, Estimated: 43 mL/min — ABNORMAL LOW (ref >60)
GFR, Estimated: 40 mL/min — ABNORMAL LOW (ref >60)
Glucose, Bld: 142 mg/dL — ABNORMAL HIGH (ref 70–99)
Glucose, Bld: 258 mg/dL — ABNORMAL HIGH (ref 70–99)
Potassium: 2.9 mmol/L — ABNORMAL LOW (ref 3.5–5.1)
Potassium: 2.7 mmol/L — CL (ref 3.5–5.1)
Sodium: 135 mmol/L (ref 135–145)
Sodium: 130 mmol/L — ABNORMAL LOW (ref 135–145)

## 2023-05-16 LAB — GLUCOSE, CAPILLARY
Glucose-Capillary: 117 mg/dL — ABNORMAL HIGH (ref 70–99)
Glucose-Capillary: 207 mg/dL — ABNORMAL HIGH (ref 70–99)
Glucose-Capillary: 189 mg/dL — ABNORMAL HIGH (ref 70–99)
Glucose-Capillary: 256 mg/dL — ABNORMAL HIGH (ref 70–99)
Glucose-Capillary: 164 mg/dL — ABNORMAL HIGH (ref 70–99)

## 2023-05-16 LAB — COOXEMETRY PANEL
Carboxyhemoglobin: 1.3 % (ref 0.5–1.5)
Carboxyhemoglobin: 2.1 % — ABNORMAL HIGH (ref 0.5–1.5)
Methemoglobin: <0.7 % (ref 0.0–1.5)
Methemoglobin: <0.7 % (ref 0.0–1.5)
O2 Saturation: 57.1 %
O2 Saturation: 59.2 %
Total hemoglobin: 8.3 g/dL — ABNORMAL LOW (ref 12.0–16.0)
Total hemoglobin: 7.9 g/dL — ABNORMAL LOW (ref 12.0–16.0)

## 2023-05-16 LAB — MAGNESIUM: Magnesium: 2 mg/dL (ref 1.7–2.4)

## 2023-05-16 MED ORDER — GUAIFENESIN 100 MG/5ML PO LIQD
15.0000 mL | ORAL | Status: DC | PRN
  Administered 2023-05-16 – 2023-05-27 (×21): 15 mL via ORAL
  Filled 2023-05-16: qty 20
  Filled 2023-05-16 (×3): qty 15
  Filled 2023-05-16: qty 20
  Filled 2023-05-16 (×3): qty 15
  Filled 2023-05-16: qty 20
  Filled 2023-05-16 (×3): qty 15
  Filled 2023-05-16 (×2): qty 20
  Filled 2023-05-16 (×2): qty 15
  Filled 2023-05-16: qty 20
  Filled 2023-05-16: qty 15
  Filled 2023-05-16: qty 20
  Filled 2023-05-16 (×2): qty 15

## 2023-05-16 MED ORDER — APIXABAN 5 MG PO TABS
5.0000 mg | ORAL_TABLET | Freq: Two times a day (BID) | ORAL | Status: DC
  Administered 2023-05-16 – 2023-05-19 (×7): 5 mg via ORAL
  Filled 2023-05-16 (×8): qty 1

## 2023-05-16 MED ORDER — POTASSIUM CHLORIDE CRYS ER 20 MEQ PO TBCR: 60.0000 meq | EXTENDED_RELEASE_TABLET | Freq: Once | ORAL | Status: DC

## 2023-05-16 MED ORDER — INSULIN ASPART 100 UNIT/ML IJ SOLN
0.0000 [IU] | Freq: Three times a day (TID) | INTRAMUSCULAR | Status: DC
  Administered 2023-05-16: 3 [IU] via SUBCUTANEOUS
  Administered 2023-05-16: 2 [IU] via SUBCUTANEOUS
  Administered 2023-05-16: 5 [IU] via SUBCUTANEOUS
  Administered 2023-05-17 (×4): 2 [IU] via SUBCUTANEOUS
  Administered 2023-05-18 – 2023-05-19 (×3): 1 [IU] via SUBCUTANEOUS
  Administered 2023-05-20: 2 [IU] via SUBCUTANEOUS
  Administered 2023-05-20 (×2): 1 [IU] via SUBCUTANEOUS
  Administered 2023-05-21: 2 [IU] via SUBCUTANEOUS
  Administered 2023-05-21: 1 [IU] via SUBCUTANEOUS
  Administered 2023-05-21 – 2023-05-22 (×4): 2 [IU] via SUBCUTANEOUS
  Administered 2023-05-22: 1 [IU] via SUBCUTANEOUS
  Administered 2023-05-22: 2 [IU] via SUBCUTANEOUS
  Administered 2023-05-23: 1 [IU] via SUBCUTANEOUS
  Administered 2023-05-23: 2 [IU] via SUBCUTANEOUS
  Administered 2023-05-24: 5 [IU] via SUBCUTANEOUS
  Administered 2023-05-24 (×3): 1 [IU] via SUBCUTANEOUS
  Administered 2023-05-25: 5 [IU] via SUBCUTANEOUS
  Administered 2023-05-25: 9 [IU] via SUBCUTANEOUS
  Administered 2023-05-25: 1 [IU] via SUBCUTANEOUS
  Administered 2023-05-26: 7 [IU] via SUBCUTANEOUS
  Administered 2023-05-26: 3 [IU] via SUBCUTANEOUS
  Administered 2023-05-26: 2 [IU] via SUBCUTANEOUS
  Administered 2023-05-27: 7 [IU] via SUBCUTANEOUS
  Administered 2023-05-27: 5 [IU] via SUBCUTANEOUS
  Administered 2023-05-27: 3 [IU] via SUBCUTANEOUS

## 2023-05-16 MED ORDER — POTASSIUM CHLORIDE 10 MEQ/50ML IV SOLN
10.0000 meq | INTRAVENOUS | Status: AC
  Administered 2023-05-16 (×3): 10 meq via INTRAVENOUS
  Filled 2023-05-16 (×3): qty 50

## 2023-05-16 MED ORDER — MORPHINE SULFATE (PF) 2 MG/ML IV SOLN
1.0000 mg | INTRAVENOUS | Status: DC | PRN
  Administered 2023-05-17: 4 mg via INTRAVENOUS
  Administered 2023-05-17: 2 mg via INTRAVENOUS
  Administered 2023-05-17: 4 mg via INTRAVENOUS
  Administered 2023-05-17: 2 mg via INTRAVENOUS
  Administered 2023-05-18: 4 mg via INTRAVENOUS
  Administered 2023-05-18: 2 mg via INTRAVENOUS
  Filled 2023-05-16: qty 1
  Filled 2023-05-16: qty 2
  Filled 2023-05-16 (×3): qty 1
  Filled 2023-05-16: qty 2

## 2023-05-16 MED ORDER — ACETAZOLAMIDE 250 MG PO TABS
500.0000 mg | ORAL_TABLET | Freq: Two times a day (BID) | ORAL | Status: AC
  Administered 2023-05-16 – 2023-05-17 (×4): 500 mg via ORAL
  Filled 2023-05-16 (×4): qty 2

## 2023-05-16 MED ORDER — FUROSEMIDE 10 MG/ML IJ SOLN
40.0000 mg | Freq: Once | INTRAMUSCULAR | Status: AC
  Administered 2023-05-16: 40 mg via INTRAVENOUS
  Filled 2023-05-16: qty 4

## 2023-05-16 MED ORDER — POTASSIUM CHLORIDE 10 MEQ/50ML IV SOLN
10.0000 meq | INTRAVENOUS | Status: AC
  Administered 2023-05-16 (×2): 10 meq via INTRAVENOUS
  Filled 2023-05-16 (×2): qty 50

## 2023-05-16 MED ORDER — POTASSIUM CHLORIDE CRYS ER 20 MEQ PO TBCR
40.0000 meq | EXTENDED_RELEASE_TABLET | Freq: Once | ORAL | Status: AC
  Administered 2023-05-16: 40 meq via ORAL
  Filled 2023-05-16: qty 2

## 2023-05-16 MED ORDER — HYDROCOD POLI-CHLORPHE POLI ER 10-8 MG/5ML PO SUER
5.0000 mL | Freq: Two times a day (BID) | ORAL | Status: DC
  Administered 2023-05-16 – 2023-05-29 (×27): 5 mL via ORAL
  Filled 2023-05-16 (×27): qty 5

## 2023-05-16 MED ORDER — ACETAZOLAMIDE SODIUM 500 MG IJ SOLR: 500.0000 mg | Freq: Two times a day (BID) | INTRAMUSCULAR | Status: DC

## 2023-05-16 NOTE — Plan of Care (Signed)
Continuing w/daily progression plan

## 2023-05-16 NOTE — Progress Notes (Signed)
  Inpatient Rehabilitation Admissions Coordinator   Met with patient and her Dad at bedside for rehab assessment. We discussed goals and expectations of a possible CIR admit. They prefer CIR for rehab. Family can provide expected caregiver support that is recommended . I await further progress with therapy and medically to begin insurance Auth for possible CIR admit.  Please call me with any questions.   Ottie Glazier, RN, MSN Rehab Admissions Coordinator [phone]

## 2023-05-16 NOTE — Progress Notes (Signed)
Physical Therapy Treatment Patient Details Name: [name] MRN: [id number] DOB: 07-23-1966 Today's Date: 05/16/2023   History of Present Illness Pt is 57 year old presented to [facility] on  05/04/23 for STEMI and underwent PCI to LAD on 7/12. Pt with cardiogenic shock and acute heart failure. Pt continued with chest pain and found to have apical ventral septal defect. Pt underwent rt heart cath and IABP placed on 7/14. Underwent surgical ventral septal defect repair on 7/19. IABP removed and pt extubated 7/20. 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    PT Comments  Progressing slowly though reports more pain in chest and not resting well at night.  She is still eager to progress and able to transition to recliner today.  Patient  with good home support and seems appropriate for intensive inpatient rehab prior to d/c home with family support.  PT will continue to follow.     Assistance Recommended at Discharge Frequent or constant Supervision/Assistance  If plan is discharge home, recommend the following:  Can travel by private vehicle    A lot of help with walking and/or transfers;A lot of help with bathing/dressing/bathroom;Assistance with cooking/housework;Assist for transportation;Help with stairs or ramp for entrance      Equipment Recommendations  Other (comment) (TBA)    Recommendations for Other Services       Precautions / Restrictions Precautions Precautions: Fall;Sternal Precaution Comments: wound vac on chest     Mobility  Bed Mobility Overal bed mobility: Needs Assistance Bed Mobility: Rolling, Sidelying to Sit Rolling: Mod assist Sidelying to sit: Mod assist, +2 for physical assistance       General bed mobility comments: assist for trunk upright and guiding legs off  bed; lot of help to scoot hips to EOB    Transfers Overall transfer level: Needs assistance Equipment used: Ambulation equipment used Transfers: Sit to/from Stand, Bed to chair/wheelchair/BSC Sit to Stand: Mod assist, +2 physical assistance           General transfer comment: up to stand with A for anterior weight shift and to rise, cues for holding pillow till upright; used flaps to sit during pivot to recliner, stood with min A from tall flaps prior to assist to lower to recliner Transfer via Lift Equipment: Stedy  Ambulation/Gait                   Stairs             Wheelchair Mobility     Tilt Bed    Modified Rankin (Stroke Patients Only)       Balance Overall balance assessment: Needs assistance Sitting-balance support: No upper extremity supported, Feet supported Sitting balance-Leahy Scale: Fair       Standing balance-Leahy Scale: Poor Standing balance comment: UE support and +2 min assist                            Cognition Arousal/Alertness: Awake/alert Behavior During Therapy: Anxious Overall Cognitive Status: Within Functional Limits for tasks assessed                                          Exercises      General Comments General comments (skin  integrity, edema, etc.): VSS on 5L O2 via [place]      Pertinent Vitals/Pain Pain Assessment Pain Score: 7  Pain Location: chest incision Pain Descriptors / Indicators: Operative site guarding, Grimacing Pain Intervention(s): Monitored during session, Repositioned, RN gave pain meds during session    Home Living                          Prior Function            PT Goals (current goals can now be found in the care plan section) Progress towards PT goals: Progressing toward goals    Frequency    Min 1X/week      PT Plan Current plan remains appropriate    Co-evaluation              AM-PAC PT "6 Clicks" Mobility   Outcome  Measure  Help needed turning from your back to your side while in a flat bed without using bedrails?: A Lot Help needed moving from lying on your back to sitting on the side of a flat bed without using bedrails?: A Lot Help needed moving to and from a bed to a chair (including a wheelchair)?: Total Help needed standing up from a chair using your arms (e.g., wheelchair or bedside chair)?: Total Help needed to walk in hospital room?: Total Help needed climbing 3-5 steps with a railing? : Total 6 Click Score: 8    End of Session Equipment Utilized During Treatment: Oxygen Activity Tolerance: Patient limited by pain Patient left: in chair;with call bell/phone within reach;with chair alarm set   PT Visit Diagnosis: Other abnormalities of gait and mobility (R26.89);Muscle weakness (generalized) (M62.81);Difficulty in walking, not elsewhere classified (R26.2);Pain Pain - part of body:  (chest)     Time: 1027-2536 PT Time Calculation (min) (ACUTE ONLY): 33 min  Charges:    $Therapeutic Activity: 23-37 mins PT General Charges $$ ACUTE PT VISIT: 1 Visit                     Sheran Lawless, PT Acute Rehabilitation Services Office:[phone] 05/16/2023    [name] 05/16/2023, 3:10 PM

## 2023-05-16 NOTE — Progress Notes (Signed)
      [address]       [place] [address]             [phone]                 5 Days Post-Op Procedure(s) (LRB): VENTRICULAR SEPTAL DEFECT (VSD) REPAIR (N/A) TRANSESOPHAGEAL ECHOCARDIOGRAM (N/A)   Events: No events _______________________________________________________________ Vitals: BP (!) 101/36   Pulse 76   Temp 98.6 F (37 C)   Resp 20   Ht 5\' 4"  (1.626 m)   Wt 103.9 kg   SpO2 100%   BMI 39.32 kg/m  Filed Weights   05/13/23 0500 05/14/23 0500 05/16/23 0451  Weight: 108.6 kg 108.5 kg 103.9 kg     - Neuro: alert NAD  - Cardiovascular: sinus  Drips: milr 0.25, amio, levo 4, CVP:  [1 mmHg-25 mmHg] 5 mmHg  - Pulm: EWOB FiO2 (%):  [50 %-60 %] 50 % PEEP:  [6 cmH20] 6 cmH20 Pressure Support:  [10 cmH20] 10 cmH20  ABG    Component Value Date/Time   PHART 7.417 05/14/2023 0955   PCO2ART 49.4 (H) 05/14/2023 0955   PO2ART 116 (H) 05/14/2023 0955   HCO3 32.0 (H) 05/14/2023 0955   TCO2 33 (H) 05/14/2023 0955   ACIDBASEDEF 3.0 (H) 05/13/2023 2259   O2SAT 57.1 05/16/2023 0414    - Abd: ND - Extremity: warm  .Intake/Output      07/23 0701 07/24 0700 07/24 0701 07/25 0700   P.O. 1050    I.V. (mL/kg) 642.5 (6.2)    Total Intake(mL/kg) 1692.5 (16.3)    Urine (mL/kg/hr) 3325 (1.3)    Drains 0    Stool 1    Chest Tube 50    Total Output 3376    Net -1683.5         Stool Occurrence 1 x       _______________________________________________________________ Labs:    Latest Ref Rng & Units 05/16/2023    4:14 AM 05/15/2023    4:27 AM 05/14/2023    9:55 AM  CBC  WBC 4.0 - 10.5 K/uL 11.3  13.1    Hemoglobin 12.0 - 15.0 g/dL 8.1  8.0  8.2   Hematocrit 36.0 - 46.0 % 25.8  24.5  24.0   Platelets 150 - 400 K/uL 299  226        Latest Ref Rng & Units 05/16/2023    4:14 AM 05/15/2023    4:27 AM 05/14/2023   12:48 PM  CMP  Glucose 70 - 99 mg/dL 914  782  956   BUN 6 - 20 mg/dL 17  18  17    Creatinine 0.44 - 1.00 mg/dL 2.13  0.86   5.78   Sodium 135 - 145 mmol/L 135  133  130   Potassium 3.5 - 5.1 mmol/L 2.9  3.0  3.2   Chloride 98 - 111 mmol/L 87  87  88   CO2 22 - 32 mmol/L 39  35  29   Calcium 8.9 - 10.3 mg/dL 8.0  8.0  7.8     CXR: stable  _______________________________________________________________  Assessment and Plan: POD 5 s/p VSD repair  Neuro: pain controlled CV: appreciate heart failure team.  Wean gtts per their recs.   On plavix for stent Pulm: IS, ambulation Renal:   Watching creat GI: on diet Heme: stable ID: afebrile Endo: SSI Dispo: continue ICU care   Corliss Skains 05/16/2023 7:55 AM

## 2023-05-16 NOTE — Progress Notes (Signed)
   05/16/23 2250  BiPAP/CPAP/SIPAP  BiPAP/CPAP/SIPAP Pt Type Adult  BiPAP/CPAP/SIPAP SERVO  Mask Type Full face mask  Mask Size Medium  Set Rate 10 breaths/min  Respiratory Rate 16 breaths/min  IPAP 6 cmH20  EPAP 6 cmH2O  Pressure Support 10 cmH20  PEEP 6 cmH20  FiO2 (%) 50 %  Flow Rate 0 lpm  Minute Ventilation 8.3  Leak 7  Peak Inspiratory Pressure (PIP) 17  Tidal Volume (Vt) 617  Patient Home Equipment No  Auto Titrate No  Press High Alarm 25 cmH2O  Press Low Alarm 5 cmH2O

## 2023-05-16 NOTE — Progress Notes (Signed)
NAME:  [name], MRN:  [id number], DOB:  11-15-1965, LOS: 12 ADMISSION DATE:  05/04/2023, CONSULTATION DATE: 05/11/2023 REFERRING MD: Dr. Cliffton Asters, CHIEF COMPLAINT: Chest pain  History of Present Illness:  57 year old female with a diabetes melitis, complicated with neuropathy and Wegener's granulomatosis who was initially admitted with acute anterior STEMI underwent PCI to LAD, who continues to require vasopressor support and IABP, echocardiogram showed mechanical complication including apical VSD, underwent VSD repair, remained intubated, on multiple vasopressor support, PCCM was consulted for help evaluation medical management  Pertinent  Medical History   Past Medical History:  Diagnosis Date   Chronic cough    Chronic pain    Diabetes mellitus without complication (HCC)    Dyspnea    GERD (gastroesophageal reflux disease)    Hypokalemia    Hypothyroidism    Left wrist pain 06/01/2021   Myonecrosis (HCC) 07/01/2021   Neurocardiogenic syncope    OSA (obstructive sleep apnea)    does not use CPAP   Osteomyelitis (HCC)    bilateral feet   Peripheral neuropathy    Presence of intrathecal pump    recieves Prialt/bupivicaine   Tear of gluteus medius tendon 07/01/2021   Wegener's granulomatosis 2009   Wegner's disease (congenital syphilitic osteochondritis)      Significant Hospital Events: Including procedures, antibiotic start and stop dates in addition to other pertinent events     Interim History / Subjective:  Oxygen requirement is coming down, currently on 2 L, able to maintain O2 sat 94% Encourage incentive spirometry Remain on milrinone and norepinephrine She is afebrile  Complaining of pain around surgical site, stated could not sleep last night  Objective   Blood pressure (!) 101/36, pulse 76, temperature 98.6 F (37 C), resp. rate 20, height 5\' 4"  (1.626 m), weight 103.9 kg, SpO2 100%. CVP:  [1 mmHg-25 mmHg] 5 mmHg  FiO2 (%):  [50 %-60 %] 50 % PEEP:  [6 cmH20]  6 cmH20 Pressure Support:  [10 cmH20] 10 cmH20   Intake/Output Summary (Last 24 hours) at 05/16/2023 0852 Last data filed at 05/16/2023 0600 Gross per 24 hour  Intake 1505.93 ml  Output 2956 ml  Net -1450.07 ml   Filed Weights   05/13/23 0500 05/14/23 0500 05/16/23 0451  Weight: 108.6 kg 108.5 kg 103.9 kg    Examination: General: Chronically ill-appearing female, sitting on recliner.  On nasal cannula oxygen HEENT: [place]/AT, eyes anicteric.  moist mucus membranes Neuro: Alert, awake following commands Chest: Coarse breath sounds, no wheezes or rhonchi Heart: Regular rate and rhythm, no murmurs or gallops Abdomen: Soft, nontender, nondistended, bowel sounds present Skin: No rash  Labs and images were reviewed Coox 59%, Na 135, K 2.9, Cr 1.42 Hb 8.1, PLT 299 Serum bicarbonate 39  Urine output 3.3 L in last 24 hours with net 1.6L  Resolved Hospital Problem list   Thrombocytopenia  Assessment & Plan:  Coronary artery disease, presented with acute anterior STEMI, complicated with apical VSD. Apical VSD s/p surgical repair S/p PCI to LAD with DES  Currently on DAPT Continue statin Chest tubes discontinued yesterday Continue pain control with tramadol, oxycodone and Dilaudid  Acute HFrEF with cardiogenic shock IABP was discontinued on 7/20 Patient ejection fraction is 45% Remain on, low-dose norepinephrine, 4 mics and milrinone 0.25, continue to titrate with MAP goal 65 Coox 59% Advanced heart failure team is following Continue 40 mg Lasix daily  Acute respiratory failure with hypoxia/hypercapnia, due to pulmonary edema Mucous plugging Wore BiPAP overnight Oxygen was titrated down  to 2 L Encourage incentive spirometry and ambulation  Hyperlipidemia Continue Crestor  Acute kidney injury due to ischemic ATN Hyponatremia Hypokalemia Serum creatinine improving slowly, down to 1.4 now She has really good urine output Avoid nephrotoxic agent Closely monitor  electrolytes Serum sodium corrected to 135 Serum K remain 2.9  Metabolic alkalosis CO2 39 Will add acetazolamide  Diabetes type 2 Patient hemoglobin A1c is 7.6 Continue sliding scale insulin and Semglee  Expected perioperative blood loss anemia Patient hemoglobin at 7.3 and platelet count improved to 226 S/p1 unit PRBC Monitor H&H and platelet count  Morbid obesity Diet and exercise counseling provided  Best Practice (right click and "Reselect all SmartList Selections" daily)   Diet/type: Full liquid diet DVT prophylaxis: SCD GI prophylaxis: PPI Lines: Central line, yes still needed  Foley:  Yes, and it is still needed Code Status:  full code Last date of multidisciplinary goals of care discussion [Per primary team]  Labs   CBC: Recent Labs  Lab 05/12/23 1554 05/13/23 0908 05/13/23 2259 05/14/23 0523 05/14/23 0955 05/15/23 0427 05/16/23 0414  WBC 16.5* 18.0*  --  18.0*  --  13.1* 11.3*  NEUTROABS  --  12.9*  --   --   --   --   --   HGB 8.2* 7.3* 8.8* 7.6* 8.2* 8.0* 8.1*  HCT 25.4* 23.4* 26.0* 23.7* 24.0* 24.5* 25.8*  MCV 87.9 90.0  --  91.9  --  88.4 90.8  PLT 103* 105*  --  146*  --  226 299    Basic Metabolic Panel: Recent Labs  Lab 05/12/23 0317 05/12/23 0420 05/12/23 1554 05/13/23 0908 05/13/23 1727 05/13/23 2259 05/14/23 0523 05/14/23 0955 05/14/23 1248 05/15/23 0427 05/16/23 0414  NA 131*   < > 130*   < > 129*   < > 131* 129* 130* 133* 135  K 3.8   < > 3.2*   < > 4.3   < > 4.3 3.4* 3.2* 3.0* 2.9*  CL 95*  --  91*   < > 89*  --  90*  --  88* 87* 87*  CO2 25  --  25   < > 27  --  29  --  29 35* 39*  GLUCOSE 191*  --  151*   < > 235*  --  165*  --  235* 197* 142*  BUN 17  --  15   < > 15  --  17  --  17 18 17   CREATININE 1.63*  --  1.59*   < > 1.47*  --  1.60*  --  1.68* 1.55* 1.42*  CALCIUM 7.4*  --  7.5*   < > 7.5*  --  7.7*  --  7.8* 8.0* 8.0*  MG 2.9*  --  2.3  --   --   --  2.1  --   --  1.9 2.0   < > = values in this interval not  displayed.   GFR: Estimated Creatinine Clearance: 51.3 mL/min (A) (by C-G formula based on SCr of 1.42 mg/dL (H)). Recent Labs  Lab 05/13/23 0908 05/14/23 0523 05/15/23 0427 05/16/23 0414  WBC 18.0* 18.0* 13.1* 11.3*    Liver Function Tests: Recent Labs  Lab 05/11/23 1936  AST 51*  ALT 15  ALKPHOS 64  BILITOT 0.5  PROT 4.3*  ALBUMIN 2.8*   No results for input(s): "LIPASE", "AMYLASE" in the last 168 hours. No results for input(s): "AMMONIA" in the last 168 hours.  ABG  Component Value Date/Time   PHART 7.417 05/14/2023 0955   PCO2ART 49.4 (H) 05/14/2023 0955   PO2ART 116 (H) 05/14/2023 0955   HCO3 32.0 (H) 05/14/2023 0955   TCO2 33 (H) 05/14/2023 0955   ACIDBASEDEF 3.0 (H) 05/13/2023 2259   O2SAT 57.1 05/16/2023 0414     Coagulation Profile: Recent Labs  Lab 05/11/23 1441  INR 1.5*    Cardiac Enzymes: No results for input(s): "CKTOTAL", "CKMB", "CKMBINDEX", "TROPONINI" in the last 168 hours.  HbA1C: HbA1c, POC (controlled diabetic range)  Date/Time Value Ref Range Status  04/03/2023 05:04 PM 7.5 (A) 0.0 - 7.0 % Final   Hgb A1c MFr Bld  Date/Time Value Ref Range Status  05/04/2023 06:20 AM 7.6 (H) 4.8 - 5.6 % Final    Comment:    (NOTE)         Prediabetes: 5.7 - 6.4         Diabetes: >6.4         Glycemic control for adults with diabetes: <7.0   01/04/2023 01:12 PM 7.6 (H) 4.8 - 5.6 % Final    Comment:             Prediabetes: 5.7 - 6.4          Diabetes: >6.4          Glycemic control for adults with diabetes: <7.0     CBG: Recent Labs  Lab 05/15/23 1543 05/15/23 2002 05/15/23 2351 05/16/23 0422 05/16/23 0827  GLUCAP 168* 266* 175* 117* 207*    Critical care time:      The patient is critically ill due to cardiogenic shock.  Critical care was necessary to treat or prevent imminent or life-threatening deterioration.  Critical care was time spent personally by me on the following activities: development of treatment plan with  patient and/or surrogate as well as nursing, discussions with consultants, evaluation of patient's response to treatment, examination of patient, obtaining history from patient or surrogate, ordering and performing treatments and interventions, ordering and review of laboratory studies, ordering and review of radiographic studies, pulse oximetry, re-evaluation of patient's condition and participation in multidisciplinary rounds.   During this encounter critical care time was devoted to patient care services described in this note for 31 minutes.     Cheri Fowler, MD [facility] See Amion for pager If no response to pager, please call [phone] until 7pm After 7pm, Please call E-link [phone]

## 2023-05-16 NOTE — Progress Notes (Signed)
Patient ID: [name], female   DOB: 1966/06/25, 57 y.o.   MRN: [id number] TCTS evening Rounds:  Still dependent on NE. Increased to 7 mcg this evening for hypotension to 70's. CVP 8  Will on milrinone 0.125.  Good urine output. -1600 cc so far today.   BMET    Component Value Date/Time   NA 130 (L) 05/16/2023 1847   NA 141 01/04/2023 1312   K 2.7 (LL) 05/16/2023 1847   CL 86 (L) 05/16/2023 1847   CO2 34 (H) 05/16/2023 1847   GLUCOSE 258 (H) 05/16/2023 1847   BUN 16 05/16/2023 1847   BUN 14 01/04/2023 1312   CREATININE 1.50 (H) 05/16/2023 1847   CREATININE 1.07 (H) 07/01/2021 0909   CALCIUM 7.9 (L) 05/16/2023 1847   EGFR 96.0 04/24/2023 0000   EGFR 49 (L) 01/04/2023 1312   GFRNONAA 40 (L) 05/16/2023 1847   Replacing K+

## 2023-05-16 NOTE — Progress Notes (Addendum)
Patient ID: [name], female   DOB: 12/22/1965, 57 y.o.   MRN: [id number]    Advanced Heart Failure Rounding Note   Subjective:    7/14: RHC mean RA 21, PA 50/25 mean 35, mean PCWP 28, CI 1.8 F/2.6 T, QpQs 3.3.  IABP placed.  7/19: Surgical repair of apical VSD.  7/20: IABP removed, extubated.   CO-OX 57% on milrinone 0.25 + NE 4.   CVP 10  Scr 1.4 K 2.9 Cl 87 CO2 up to 39  Chest is sore today. Didn't sleep well last night.    Objective:     Vital Signs:   Temp:  [97.3 F (36.3 C)-99.5 F (37.5 C)] 98.6 F (37 C) (07/24 0442) Pulse Rate:  [76] 76 (07/23 1600) Resp:  [12-33] 20 (07/24 0442) BP: (73-119)/(29-104) 101/36 (07/24 0442) SpO2:  [90 %-100 %] 100 % (07/24 0442) FiO2 (%):  [50 %-60 %] 50 % (07/24 0250) Weight:  [103.9 kg] 103.9 kg (07/24 0451) Last BM Date : 05/15/23  Weight change: Filed Weights   05/13/23 0500 05/14/23 0500 05/16/23 0451  Weight: 108.6 kg 108.5 kg 103.9 kg    Intake/Output:   Intake/Output Summary (Last 24 hours) at 05/16/2023 1004 Last data filed at 05/16/2023 0600 Gross per 24 hour  Intake 1447.71 ml  Output 2956 ml  Net -1508.29 ml     Physical Exam: General:  Fatigued appearing HEENT: normal Neck: supple. JVP 8-10. Carotids 2+ bilat; no bruits.  Cor: PMI nondisplaced. Regular rate & rhythm. No rubs, gallops or murmurs. VAC over sternum Lungs: diminished Abdomen: obese, soft, nontender, nondistended.  Extremities: no cyanosis, clubbing, rash, b/l TMA Neuro: alert & orientedx3. Affect pleasant   Telemetry: 60s-70s  Labs: Basic Metabolic Panel: Recent Labs  Lab 05/12/23 0317 05/12/23 0420 05/12/23 1554 05/13/23 0908 05/13/23 1727 05/13/23 2259 05/14/23 0523 05/14/23 0955 05/14/23 1248 05/15/23 0427 05/16/23 0414  NA 131*   < > 130*   < > 129*   < > 131* 129* 130* 133* 135  K 3.8   < > 3.2*   < > 4.3   < > 4.3 3.4* 3.2* 3.0* 2.9*  CL 95*  --  91*   < > 89*  --  90*  --  88* 87* 87*  CO2 25  --  25   < >  27  --  29  --  29 35* 39*  GLUCOSE 191*  --  151*   < > 235*  --  165*  --  235* 197* 142*  BUN 17  --  15   < > 15  --  17  --  17 18 17   CREATININE 1.63*  --  1.59*   < > 1.47*  --  1.60*  --  1.68* 1.55* 1.42*  CALCIUM 7.4*  --  7.5*   < > 7.5*  --  7.7*  --  7.8* 8.0* 8.0*  MG 2.9*  --  2.3  --   --   --  2.1  --   --  1.9 2.0   < > = values in this interval not displayed.    Liver Function Tests: Recent Labs  Lab 05/11/23 1936  AST 51*  ALT 15  ALKPHOS 64  BILITOT 0.5  PROT 4.3*  ALBUMIN 2.8*    No results for input(s): "LIPASE", "AMYLASE" in the last 168 hours. No results for input(s): "AMMONIA" in the last 168 hours.  CBC: Recent Labs  Lab 05/12/23 1554 05/13/23  2951 05/13/23 2259 05/14/23 0523 05/14/23 0955 05/15/23 0427 05/16/23 0414  WBC 16.5* 18.0*  --  18.0*  --  13.1* 11.3*  NEUTROABS  --  12.9*  --   --   --   --   --   HGB 8.2* 7.3* 8.8* 7.6* 8.2* 8.0* 8.1*  HCT 25.4* 23.4* 26.0* 23.7* 24.0* 24.5* 25.8*  MCV 87.9 90.0  --  91.9  --  88.4 90.8  PLT 103* 105*  --  146*  --  226 299    Cardiac Enzymes: No results for input(s): "CKTOTAL", "CKMB", "CKMBINDEX", "TROPONINI" in the last 168 hours.  BNP: BNP (last 3 results) Recent Labs    05/04/23 0620  BNP 123.2*    ProBNP (last 3 results) No results for input(s): "PROBNP" in the last 8760 hours.    Other results:  Imaging: DG CHEST PORT 1 VIEW  Result Date: 05/15/2023 CLINICAL DATA:  Ventricular septal defect repair, pleural effusion EXAM: PORTABLE CHEST 1 VIEW COMPARISON:  Previous studies including the examination of 05/13/2023 FINDINGS: Transverse diameter of heart is increased. Increased density in left mid and left lower lung fields may be due to pleural effusion and underlying infiltrates. Central pulmonary vessels are less prominent. There is improvement in aeration in right lung. Mediastinal drains are noted. Tip of PICC line is noted in the course of superior vena cava. IMPRESSION:  Cardiomegaly. There is interval decrease in pulmonary vascular congestion. Homogeneous opacity in left mid and left lower lung fields may be due to pleural effusion and underlying atelectasis/pneumonia. Electronically Signed   By: Ernie Avena M.D.   On: 05/15/2023 08:29     Medications:     Scheduled Medications:  acetaminophen  1,000 mg Oral Q6H   Or   acetaminophen (TYLENOL) oral liquid 160 mg/5 mL  1,000 mg Per Tube Q6H   acetaZOLAMIDE  500 mg Oral BID   amitriptyline  50 mg Oral QHS   aspirin EC  81 mg Oral Daily   bisacodyl  10 mg Oral BID   Or   bisacodyl  10 mg Rectal BID   Chlorhexidine Gluconate Cloth  6 each Topical Daily   chlorpheniramine-HYDROcodone  5 mL Oral BID   clopidogrel  75 mg Oral Daily   docusate sodium  200 mg Oral BID   DULoxetine  60 mg Oral BID   feeding supplement  237 mL Oral BID BM   furosemide  40 mg Intravenous Once   gabapentin  300 mg Oral BID   insulin aspart  0-9 Units Subcutaneous TID WC & HS   insulin glargine-yfgn  12 Units Subcutaneous BID   levothyroxine  25 mcg Oral Daily   linaclotide  145 mcg Oral QAC breakfast   mouth rinse  15 mL Mouth Rinse 4 times per day   pantoprazole  40 mg Oral Daily   rosuvastatin  40 mg Oral Daily    Infusions:  sodium chloride     sodium chloride     amiodarone 30 mg/hr (05/16/23 0944)   milrinone 0.25 mcg/kg/min (05/16/23 0600)   norepinephrine (LEVOPHED) Adult infusion 4 mcg/min (05/16/23 0600)    PRN Medications: sodium chloride, dextrose, guaiFENesin, HYDROmorphone, ipratropium-albuterol, lidocaine, methocarbamol, morphine injection, ondansetron (ZOFRAN) IV, mouth rinse, oxyCODONE, traMADol    Patient Profile    [name] is a 57 y.o. 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 Admitted with STEMI s/p PDI to LAD  now in cardiogenic shock.    Assessment/Plan:   Cardiogenic  shock -> acute HF with mildly-reduced EF complicated by apical VSD.  - Echo 6/23 EF 55%, no RWMA, normal RV, trivial MR - Echo 7/12,  EF 45% - Echo 7/14: EF 60% with apical VSD, normal RV. Apical VSD creates large shunt by RHC QpQs 3.3.  - TEE 7/15 showed EF 55% with apical akinesis and apical VSD with significant L>R flow, RV mildly dilated and dysfunctional.   - OR 7/19 for surgical repair of apical VSD.  - CO-OX 57% on milrinone 0.25 + NE 4. Recheck CO-OX. Try to wean off NE. - CVP 10. Give 40 mg lasix IV. Agree with addition of diamox 500 BID (per CCM). Supp K. - Can stop amiodarone gtt once off inotrope support. - Start SGLT2i once foley is out - Eventually will need anticoagulation due to prosthetic material for VSD repair on both sides of interventricular septum at apex.  Start anticoagulation when okay with TCTS.  Eventual regimen will be Plavix + apixaban.    Anterior STEMI, CAD - HsTrop >24K on admission - s/p PCI to LAD 7/12, post DES 0% residual stenosis. Medical management of 40% stenosed RCA.  - LDL unable to be calculated with high TGD, continue Crestor 40.  - Complicated by infarct-related VSD.  - On DAPT with ASA and plavix until apixaban started. She will be on Plavix + apixaban as home regimen.   3. Post-MI chest pain -possible pericarditis - Chest pain resolved.    DM II - A1c last 7.6 - SSI while admitted per primary - has diabetic foot ulcer, WOC has seen - ABI 12/22 were normal   Obesity - Body mass index is 37.76 kg/m.  - consider GLP-1 as OP   OSA - does not use CPAP   HLD - Continue statin  7. AKI - Scr stable 1.42 today - Follow with diuresis  8. Hyponatremia - Hypervolemic hyponatremia.  - Resolved - Fluid restrict.   9. Anemia - Hgb 8.1 this am - 1 u RBCs on 07/20 - Transfuse Hgb < 7  10. Thrombocytopenia - IABP now out - Resolved  11. Acute hypoxemic respiratory failure - Now off BiPAP - O2 stable on 6L HFNC - Encourage  IS  Out of bed today. Very weak.   Considering CIR once ready for discharge.  FINCH, LINDSAY N 05/16/2023 10:04 AM   Agree with above.   Remains on milrinone and NE. BP and co-ox marginal  Feeling better though. Able to get into chair. CTs and swan out.   CVP 10. Remains in NSR on IV amio  General:  Sitting in chair . No resp difficulty HEENT: normal Neck: supple. JVP 10. Carotids 2+ bilat; no bruits. No lymphadenopathy or thryomegaly appreciated. Cor: Sternal dressing ok Regular rate & rhythm. No rubs, gallops or murmurs. Lungs: clear Abdomen: soft, nontender, nondistended. No hepatosplenomegaly. No bruits or masses. Good bowel sounds. Extremities: no cyanosis, clubbing, rash, edema + bilateral transmet amputations Neuro: alert & orientedx3, cranial nerves grossly intact. moves all 4 extremities w/o difficulty. Affect pleasant  Remains on NE/milrinone. Will continue to wean milrinone. May need midodrine support. Give lasix today. Can switch IV amio to po. Will start Eliquis (D/w Dr. Cliffton Asters and PharmD)  Continue IS/PT/OT  CRITICAL CARE Performed by: Arvilla Meres  Total critical care time: 40 minutes  Critical care time was exclusive of separately billable procedures and treating other patients.  Critical care was necessary to treat  or prevent imminent or life-threatening deterioration.  Critical care was time spent personally by me (independent of midlevel providers or residents) on the following activities: development of treatment plan with patient and/or surrogate as well as nursing, discussions with consultants, evaluation of patient's response to treatment, examination of patient, obtaining history from patient or surrogate, ordering and performing treatments and interventions, ordering and review of laboratory studies, ordering and review of radiographic studies, pulse oximetry and re-evaluation of patient's condition.  Arvilla Meres, MD  7:04  PM

## 2023-05-16 NOTE — Progress Notes (Signed)
Notified Dr. Laneta Simmers on call of K 2.7. See new orders.

## 2023-05-16 NOTE — Progress Notes (Incomplete)
   05/16/23 0250  BiPAP/CPAP/SIPAP  $ Non-Invasive Ventilator  Non-Invasive Vent Subsequent  BiPAP/CPAP/SIPAP V60  Mask Type Full face mask  Mask Size Medium  Set Rate 10 breaths/min  Respiratory Rate 16 breaths/min  IPAP 16 cmH20  EPAP 6 cmH2O  Pressure Support 10 cmH20  PEEP 6 cmH20  FiO2 (%) 50 %  Flow Rate 0 lpm  Minute Ventilation 8.4  Leak 0  Peak Inspiratory Pressure (PIP) 538  Patient Home Equipment No  Auto Titrate No  Press High Alarm 25 cmH2O  Press Low Alarm 5 cmH2O

## 2023-05-17 DIAGNOSIS — I232 Ventricular septal defect as current complication following acute myocardial infarction: Secondary | ICD-10-CM | POA: Diagnosis not present

## 2023-05-17 DIAGNOSIS — I2102 ST elevation (STEMI) myocardial infarction involving left anterior descending coronary artery: Secondary | ICD-10-CM | POA: Diagnosis not present

## 2023-05-17 DIAGNOSIS — R570 Cardiogenic shock: Secondary | ICD-10-CM | POA: Diagnosis not present

## 2023-05-17 LAB — CBC
HCT: 25.9 % — ABNORMAL LOW (ref 36.0–46.0)
Hemoglobin: 7.8 g/dL — ABNORMAL LOW (ref 12.0–15.0)
MCH: 27.4 pg (ref 26.0–34.0)
MCHC: 30.1 g/dL (ref 30.0–36.0)
MCV: 90.9 fL (ref 80.0–100.0)
RBC: 2.85 MIL/uL — ABNORMAL LOW (ref 3.87–5.11)
RDW: 14.7 % (ref 11.5–15.5)
WBC: 11.7 10*3/uL — ABNORMAL HIGH (ref 4.0–10.5)
nRBC: 0.2 % (ref 0.0–0.2)

## 2023-05-17 LAB — GLUCOSE, CAPILLARY
Glucose-Capillary: 155 mg/dL — ABNORMAL HIGH (ref 70–99)
Glucose-Capillary: 176 mg/dL — ABNORMAL HIGH (ref 70–99)
Glucose-Capillary: 179 mg/dL — ABNORMAL HIGH (ref 70–99)
Glucose-Capillary: 188 mg/dL — ABNORMAL HIGH (ref 70–99)

## 2023-05-17 LAB — BASIC METABOLIC PANEL
Anion gap: 14 (ref 5–15)
Anion gap: 10 (ref 5–15)
BUN: 15 mg/dL (ref 6–20)
BUN: 14 mg/dL (ref 6–20)
BUN: 17 mg/dL (ref 6–20)
CO2: 32 mmol/L (ref 22–32)
CO2: 31 mmol/L (ref 22–32)
CO2: 27 mmol/L (ref 22–32)
Calcium: 8 mg/dL — ABNORMAL LOW (ref 8.9–10.3)
Calcium: 8.5 mg/dL — ABNORMAL LOW (ref 8.9–10.3)
Calcium: 8.1 mg/dL — ABNORMAL LOW (ref 8.9–10.3)
Chloride: 87 mmol/L — ABNORMAL LOW (ref 98–111)
Chloride: 92 mmol/L — ABNORMAL LOW (ref 98–111)
Creatinine, Ser: 1.31 mg/dL — ABNORMAL HIGH (ref 0.44–1.00)
Creatinine, Ser: 1.22 mg/dL — ABNORMAL HIGH (ref 0.44–1.00)
Creatinine, Ser: 1.28 mg/dL — ABNORMAL HIGH (ref 0.44–1.00)
GFR, Estimated: 48 mL/min — ABNORMAL LOW (ref >60)
GFR, Estimated: 52 mL/min — ABNORMAL LOW (ref >60)
GFR, Estimated: 49 mL/min — ABNORMAL LOW (ref >60)
Glucose, Bld: 166 mg/dL — ABNORMAL HIGH (ref 70–99)
Glucose, Bld: 207 mg/dL — ABNORMAL HIGH (ref 70–99)
Potassium: 3 mmol/L — ABNORMAL LOW (ref 3.5–5.1)
Potassium: 3.1 mmol/L — ABNORMAL LOW (ref 3.5–5.1)
Potassium: 3.8 mmol/L (ref 3.5–5.1)
Sodium: 130 mmol/L — ABNORMAL LOW (ref 135–145)
Sodium: 132 mmol/L — ABNORMAL LOW (ref 135–145)
Sodium: 129 mmol/L — ABNORMAL LOW (ref 135–145)

## 2023-05-17 LAB — MAGNESIUM: Magnesium: 1.9 mg/dL (ref 1.7–2.4)

## 2023-05-17 LAB — COOXEMETRY PANEL
Methemoglobin: <0.7 % (ref 0.0–1.5)
O2 Saturation: 61.3 %
Total hemoglobin: 8.4 g/dL — ABNORMAL LOW (ref 12.0–16.0)

## 2023-05-17 MED ORDER — POTASSIUM CHLORIDE CRYS ER 20 MEQ PO TBCR: 20.0000 meq | EXTENDED_RELEASE_TABLET | ORAL | Status: DC

## 2023-05-17 MED ORDER — FUROSEMIDE 40 MG PO TABS
40.0000 mg | ORAL_TABLET | Freq: Every day | ORAL | Status: DC
  Administered 2023-05-17: 40 mg via ORAL
  Filled 2023-05-17: qty 1

## 2023-05-17 MED ORDER — POTASSIUM CHLORIDE CRYS ER 20 MEQ PO TBCR
40.0000 meq | EXTENDED_RELEASE_TABLET | Freq: Once | ORAL | Status: AC
  Administered 2023-05-17: 40 meq via ORAL
  Filled 2023-05-17: qty 2

## 2023-05-17 MED ORDER — MAGNESIUM SULFATE 2 GM/50ML IV SOLN: 2.0000 g | Freq: Once | INTRAVENOUS | Status: DC

## 2023-05-17 MED ORDER — AMIODARONE HCL 200 MG PO TABS
200.0000 mg | ORAL_TABLET | Freq: Two times a day (BID) | ORAL | Status: DC
  Administered 2023-05-17 – 2023-05-28 (×23): 200 mg via ORAL
  Filled 2023-05-17 (×24): qty 1

## 2023-05-17 MED ORDER — POTASSIUM CHLORIDE 10 MEQ/50ML IV SOLN
10.0000 meq | INTRAVENOUS | Status: AC
  Administered 2023-05-17 (×3): 10 meq via INTRAVENOUS
  Filled 2023-05-17 (×3): qty 50

## 2023-05-17 MED ORDER — MAGNESIUM SULFATE 2 GM/50ML IV SOLN
2.0000 g | Freq: Once | INTRAVENOUS | Status: AC
  Administered 2023-05-17: 2 g via INTRAVENOUS
  Filled 2023-05-17: qty 50

## 2023-05-17 MED ORDER — POTASSIUM CHLORIDE CRYS ER 20 MEQ PO TBCR
60.0000 meq | EXTENDED_RELEASE_TABLET | ORAL | Status: AC
  Administered 2023-05-17 (×2): 60 meq via ORAL
  Filled 2023-05-17 (×2): qty 3

## 2023-05-17 MED ORDER — POTASSIUM CHLORIDE CRYS ER 20 MEQ PO TBCR
60.0000 meq | EXTENDED_RELEASE_TABLET | Freq: Once | ORAL | Status: AC
  Administered 2023-05-17: 60 meq via ORAL
  Filled 2023-05-17: qty 3

## 2023-05-17 NOTE — Progress Notes (Signed)
NAME:  [name], MRN:  [id number], DOB:  01/08/1966, LOS: 13 ADMISSION DATE:  05/04/2023, CONSULTATION DATE: 05/11/2023 REFERRING MD: Dr. Cliffton Asters, CHIEF COMPLAINT: Chest pain  History of Present Illness:  57 year old female with a diabetes melitis, complicated with neuropathy and Wegener's granulomatosis who was initially admitted with acute anterior STEMI underwent PCI to LAD, who continues to require vasopressor support and IABP, echocardiogram showed mechanical complication including apical VSD, underwent VSD repair, remained intubated, on multiple vasopressor support, PCCM was consulted for help evaluation medical management  Pertinent  Medical History   Past Medical History:  Diagnosis Date   Chronic cough    Chronic pain    Diabetes mellitus without complication (HCC)    Dyspnea    GERD (gastroesophageal reflux disease)    Hypokalemia    Hypothyroidism    Left wrist pain 06/01/2021   Myonecrosis (HCC) 07/01/2021   Neurocardiogenic syncope    OSA (obstructive sleep apnea)    does not use CPAP   Osteomyelitis (HCC)    bilateral feet   Peripheral neuropathy    Presence of intrathecal pump    recieves Prialt/bupivicaine   Tear of gluteus medius tendon 07/01/2021   Wegener's granulomatosis 2009   Wegner's disease (congenital syphilitic osteochondritis)      Significant Hospital Events: Including procedures, antibiotic start and stop dates in addition to other pertinent events     Interim History / Subjective:  Remains on 2 to 3 L oxygen Still on milrinone and low-dose norepinephrine at 4 mics Stated pain is better controlled   Objective   Blood pressure 120/79, pulse 76, temperature 99.5 F (37.5 C), resp. rate 20, height 5\' 4"  (1.626 m), weight 103.9 kg, SpO2 100%. CVP:  [3 mmHg-29 mmHg] 7 mmHg  FiO2 (%):  [50 %] 50 % PEEP:  [6 cmH20] 6 cmH20 Pressure Support:  [10 cmH20] 10 cmH20   Intake/Output Summary (Last 24 hours) at 05/17/2023 0905 Last data filed at  05/17/2023 0600 Gross per 24 hour  Intake 895.12 ml  Output 2860 ml  Net -1964.88 ml   Filed Weights   05/13/23 0500 05/14/23 0500 05/16/23 0451  Weight: 108.6 kg 108.5 kg 103.9 kg    Examination: General: Chronically ill-appearing female, lying on the bed HEENT: [place]/AT, eyes anicteric.  moist mucus membranes Neuro: Alert, awake following commands Chest: Coarse breath sounds, no wheezes or rhonchi Heart: Regular rate and rhythm, no murmurs or gallops Abdomen: Soft, nontender, nondistended, bowel sounds present Skin: No rash  Labs and images were reviewed Coox 61%, Na 132, K 3.1, Cr 1.22 Hb 7.8, PLT 358 Serum bicarbonate 32  Urine output 3.1 L in last 24 hours with net -2L  Resolved Hospital Problem list   Thrombocytopenia  Assessment & Plan:  Coronary artery disease, presented with acute anterior STEMI, complicated with apical VSD. Apical VSD s/p surgical repair S/p PCI to LAD with DES  Continue Plavix and apixaban Continue statin Continue pain control with tramadol, oxycodone and Dilaudid Encourage incentive spirometry and ambulation  Acute HFrEF with cardiogenic shock IABP was discontinued on 7/20 Patient ejection fraction is 45% To require milrinone and low-dose vasopressor with norepinephrine Coox 61% Advanced heart failure team is following Continue diuresis with Lasix  Acute respiratory failure with hypoxia/hypercapnia, due to pulmonary edema Mucous plugging Wore BiPAP overnight Oxygen was titrated down to 2-3 L Encourage incentive spirometry and ambulation  Hyperlipidemia Continue Crestor  Acute kidney injury due to ischemic ATN Hyponatremia Hypokalemia Serum creatinine improving slowly, down to 1.2 She  has really good urine output Avoid nephrotoxic agent Closely monitor electrolytes Serum sodium corrected to 132 Serum K remain 3.1 Continue aggressive electrolyte replacement  Metabolic alkalosis Serum bicarbonate trended down to 32 Continue  acetazolamide for 2 more doses  Diabetes type 2 Patient hemoglobin A1c is 7.6 Continue sliding scale insulin and Semglee  Expected perioperative blood loss anemia H&H and platelet counts are stable now S/p1 unit PRBC few days ago Monitor H&H and platelet count  Morbid obesity Diet and exercise counseling provided  Best Practice (right click and "Reselect all SmartList Selections" daily)   Diet/type: Full liquid diet DVT prophylaxis: Apixaban GI prophylaxis: PPI Lines: Central line, yes still needed  Foley:  Yes, and it is still needed Code Status:  full code Last date of multidisciplinary goals of care discussion [Per primary team]  Labs   CBC: Recent Labs  Lab 05/13/23 0908 05/13/23 2259 05/14/23 0523 05/14/23 0955 05/15/23 0427 05/16/23 0414 05/17/23 0355  WBC 18.0*  --  18.0*  --  13.1* 11.3* 11.7*  NEUTROABS 12.9*  --   --   --   --   --   --   HGB 7.3*   < > 7.6* 8.2* 8.0* 8.1* 7.8*  HCT 23.4*   < > 23.7* 24.0* 24.5* 25.8* 25.9*  MCV 90.0  --  91.9  --  88.4 90.8 90.9  PLT 105*  --  146*  --  226 299 358   < > = values in this interval not displayed.    Basic Metabolic Panel: Recent Labs  Lab 05/12/23 1554 05/13/23 0908 05/14/23 0523 05/14/23 0955 05/14/23 1248 05/15/23 0427 05/16/23 0414 05/16/23 1847 05/17/23 0355  NA 130*   < > 131*   < > 130* 133* 135 130* 130*  K 3.2*   < > 4.3   < > 3.2* 3.0* 2.9* 2.7* 3.0*  CL 91*   < > 90*  --  88* 87* 87* 86* 88*  CO2 25   < > 29  --  29 35* 39* 34* 32  GLUCOSE 151*   < > 165*  --  235* 197* 142* 258* 146*  BUN 15   < > 17  --  17 18 17 16 15   CREATININE 1.59*   < > 1.60*  --  1.68* 1.55* 1.42* 1.50* 1.31*  CALCIUM 7.5*   < > 7.7*  --  7.8* 8.0* 8.0* 7.9* 8.0*  MG 2.3  --  2.1  --   --  1.9 2.0  --  1.9   < > = values in this interval not displayed.   GFR: Estimated Creatinine Clearance: 55.7 mL/min (A) (by C-G formula based on SCr of 1.31 mg/dL (H)). Recent Labs  Lab 05/14/23 0523 05/15/23 0427  05/16/23 0414 05/17/23 0355  WBC 18.0* 13.1* 11.3* 11.7*    Liver Function Tests: Recent Labs  Lab 05/11/23 1936  AST 51*  ALT 15  ALKPHOS 64  BILITOT 0.5  PROT 4.3*  ALBUMIN 2.8*   No results for input(s): "LIPASE", "AMYLASE" in the last 168 hours. No results for input(s): "AMMONIA" in the last 168 hours.  ABG    Component Value Date/Time   PHART 7.417 05/14/2023 0955   PCO2ART 49.4 (H) 05/14/2023 0955   PO2ART 116 (H) 05/14/2023 0955   HCO3 32.0 (H) 05/14/2023 0955   TCO2 33 (H) 05/14/2023 0955   ACIDBASEDEF 3.0 (H) 05/13/2023 2259   O2SAT 61.3 05/17/2023 0355     Coagulation Profile: Recent  Labs  Lab 05/11/23 1441  INR 1.5*    Cardiac Enzymes: No results for input(s): "CKTOTAL", "CKMB", "CKMBINDEX", "TROPONINI" in the last 168 hours.  HbA1C: HbA1c, POC (controlled diabetic range)  Date/Time Value Ref Range Status  04/03/2023 05:04 PM 7.5 (A) 0.0 - 7.0 % Final   Hgb A1c MFr Bld  Date/Time Value Ref Range Status  05/04/2023 06:20 AM 7.6 (H) 4.8 - 5.6 % Final    Comment:    (NOTE)         Prediabetes: 5.7 - 6.4         Diabetes: >6.4         Glycemic control for adults with diabetes: <7.0   01/04/2023 01:12 PM 7.6 (H) 4.8 - 5.6 % Final    Comment:             Prediabetes: 5.7 - 6.4          Diabetes: >6.4          Glycemic control for adults with diabetes: <7.0     CBG: Recent Labs  Lab 05/16/23 0827 05/16/23 1139 05/16/23 1620 05/16/23 2047 05/17/23 0843  GLUCAP 207* 189* 256* 164* 155*    Critical care time:      The patient is critically ill due to cardiogenic shock.  Critical care was necessary to treat or prevent imminent or life-threatening deterioration.  Critical care was time spent personally by me on the following activities: development of treatment plan with patient and/or surrogate as well as nursing, discussions with consultants, evaluation of patient's response to treatment, examination of patient, obtaining history from  patient or surrogate, ordering and performing treatments and interventions, ordering and review of laboratory studies, ordering and review of radiographic studies, pulse oximetry, re-evaluation of patient's condition and participation in multidisciplinary rounds.   During this encounter critical care time was devoted to patient care services described in this note for 30 minutes.     Cheri Fowler, MD [facility] See Amion for pager If no response to pager, please call [phone] until 7pm After 7pm, Please call E-link [phone]

## 2023-05-17 NOTE — Progress Notes (Addendum)
[address]       [place] [address]             [phone]                 6 Days Post-Op Procedure(s) (LRB): VENTRICULAR SEPTAL DEFECT (VSD) REPAIR (N/A) TRANSESOPHAGEAL ECHOCARDIOGRAM (N/A)   Events: No events _______________________________________________________________ Vitals: BP 120/79   Pulse 76   Temp 99.5 F (37.5 C)   Resp 20   Ht 5\' 4"  (1.626 m)   Wt 103.9 kg   SpO2 100%   BMI 39.32 kg/m  Filed Weights   05/13/23 0500 05/14/23 0500 05/16/23 0451  Weight: 108.6 kg 108.5 kg 103.9 kg     - Neuro: alert NAD  - Cardiovascular: sinus  Drips:   milr 0.125 levo 4, CVP:  [3 mmHg-29 mmHg] 7 mmHg  - Pulm: EWOB FiO2 (%):  [50 %] 50 % PEEP:  [6 cmH20] 6 cmH20 Pressure Support:  [10 cmH20] 10 cmH20  ABG    Component Value Date/Time   PHART 7.417 05/14/2023 0955   PCO2ART 49.4 (H) 05/14/2023 0955   PO2ART 116 (H) 05/14/2023 0955   HCO3 32.0 (H) 05/14/2023 0955   TCO2 33 (H) 05/14/2023 0955   ACIDBASEDEF 3.0 (H) 05/13/2023 2259   O2SAT 61.3 05/17/2023 0355    - Abd: ND - Extremity: warm  .Intake/Output      07/24 0701 07/25 0700 07/25 0701 07/26 0700   P.O. 240    I.V. (mL/kg) 586 (5.6)    IV Piggyback 220    Total Intake(mL/kg) 1046 (10.1)    Urine (mL/kg/hr) 3110 (1.2)    Drains     Stool     Chest Tube     Total Output 3110    Net -2064            _______________________________________________________________ Labs:    Latest Ref Rng & Units 05/17/2023    3:55 AM 05/16/2023    4:14 AM 05/15/2023    4:27 AM  CBC  WBC 4.0 - 10.5 K/uL 11.7  11.3  13.1   Hemoglobin 12.0 - 15.0 g/dL 7.8  8.1  8.0   Hematocrit 36.0 - 46.0 % 25.9  25.8  24.5   Platelets 150 - 400 K/uL 358  299  226       Latest Ref Rng & Units 05/17/2023    3:55 AM 05/16/2023    6:47 PM 05/16/2023    4:14 AM  CMP  Glucose 70 - 99 mg/dL 132  440  102   BUN 6 - 20 mg/dL 15  16  17    Creatinine 0.44 - 1.00 mg/dL 7.25  3.66  4.40   Sodium 135 -  145 mmol/L 130  130  135   Potassium 3.5 - 5.1 mmol/L 3.0  2.7  2.9   Chloride 98 - 111 mmol/L 88  86  87   CO2 22 - 32 mmol/L 32  34  39   Calcium 8.9 - 10.3 mg/dL 8.0  7.9  8.0     CXR: stable  _______________________________________________________________  Assessment and Plan: POD 6 s/p VSD repair  Neuro: pain controlled CV: appreciate heart failure team.  Wean gtts per their recs.   On plavix for stent Pulm: IS, ambulation Renal:   Watching creat GI: on diet Heme: on plavix.  On eliquis for felt repair ID: [email or web address] Endo: SSI Dispo: continue ICU care   Corliss Skains 05/17/2023 7:52  AM

## 2023-05-17 NOTE — Progress Notes (Addendum)
Patient ID: [name], female   DOB: 05/21/1966, 57 y.o.   MRN: [id number]    Advanced Heart Failure Rounding Note   Subjective:    7/14: RHC mean RA 21, PA 50/25 mean 35, mean PCWP 28, CI 1.8 F/2.6 T, QpQs 3.3.  IABP placed.  7/19: Surgical repair of apical VSD.  7/20: IABP removed, extubated.   CO-OX 61% on milrinone 0.125 + 4 NE  No weight today. 3L UOP last 24 hrs with 40 mg lasix IV + diamox 500 BID  Scr 1.3 CO2 32 Na 130 K 3.0 Mag 1.9  Got out of bed to chair yesterday.   Objective:     Vital Signs:   Temp:  [97.7 F (36.5 C)-99.9 F (37.7 C)] 99.5 F (37.5 C) (07/25 0700) Resp:  [11-26] 20 (07/25 0700) BP: (94-139)/(52-103) 120/79 (07/25 0700) SpO2:  [82 %-100 %] 100 % (07/25 0700) FiO2 (%):  [50 %] 50 % (07/25 0444) Last BM Date : 05/15/23  Weight change: Filed Weights   05/13/23 0500 05/14/23 0500 05/16/23 0451  Weight: 108.6 kg 108.5 kg 103.9 kg    Intake/Output:   Intake/Output Summary (Last 24 hours) at 05/17/2023 1017 Last data filed at 05/17/2023 0600 Gross per 24 hour  Intake 842.4 ml  Output 2635 ml  Net -1792.6 ml     Physical Exam: General:  Fatigued appearing HEENT: normal Neck: supple. JVP 7-8. Carotids 2+ bilat; no bruits.  Cor: PMI nondisplaced. Regular rate & rhythm. No rubs, gallops or murmurs. + sternal VAC Lungs: clear Abdomen: obese, soft, nontender, nondistended.  Extremities: no cyanosis, clubbing, rash, b/l TMA, LUE PICC Neuro: alert & orientedx3. Affect pleasant    Telemetry: SR 60s  Labs: Basic Metabolic Panel: Recent Labs  Lab 05/12/23 1554 05/13/23 0908 05/14/23 0523 05/14/23 0955 05/14/23 1248 05/15/23 0427 05/16/23 0414 05/16/23 1847 05/17/23 0355  NA 130*   < > 131*   < > 130* 133* 135 130* 130*  K 3.2*   < > 4.3   < > 3.2* 3.0* 2.9* 2.7* 3.0*  CL 91*   < > 90*  --  88* 87* 87* 86* 88*  CO2 25   < > 29  --  29 35* 39* 34* 32  GLUCOSE 151*   < > 165*  --  235* 197* 142* 258* 146*  BUN 15   < > 17   --  17 18 17 16 15   CREATININE 1.59*   < > 1.60*  --  1.68* 1.55* 1.42* 1.50* 1.31*  CALCIUM 7.5*   < > 7.7*  --  7.8* 8.0* 8.0* 7.9* 8.0*  MG 2.3  --  2.1  --   --  1.9 2.0  --  1.9   < > = values in this interval not displayed.    Liver Function Tests: Recent Labs  Lab 05/11/23 1936  AST 51*  ALT 15  ALKPHOS 64  BILITOT 0.5  PROT 4.3*  ALBUMIN 2.8*    No results for input(s): "LIPASE", "AMYLASE" in the last 168 hours. No results for input(s): "AMMONIA" in the last 168 hours.  CBC: Recent Labs  Lab 05/13/23 0908 05/13/23 2259 05/14/23 0523 05/14/23 0955 05/15/23 0427 05/16/23 0414 05/17/23 0355  WBC 18.0*  --  18.0*  --  13.1* 11.3* 11.7*  NEUTROABS 12.9*  --   --   --   --   --   --   HGB 7.3*   < > 7.6* 8.2* 8.0* 8.1* 7.8*  HCT 23.4*   < > 23.7* 24.0* 24.5* 25.8* 25.9*  MCV 90.0  --  91.9  --  88.4 90.8 90.9  PLT 105*  --  146*  --  226 299 358   < > = values in this interval not displayed.    Cardiac Enzymes: No results for input(s): "CKTOTAL", "CKMB", "CKMBINDEX", "TROPONINI" in the last 168 hours.  BNP: BNP (last 3 results) Recent Labs    05/04/23 0620  BNP 123.2*    ProBNP (last 3 results) No results for input(s): "PROBNP" in the last 8760 hours.    Other results:  Imaging: DG Chest Port 1 View  Result Date: 05/16/2023 CLINICAL DATA:  Chest pain EXAM: PORTABLE CHEST 1 VIEW COMPARISON:  05/15/2023 FINDINGS: Left IJ sheath and left-sided PICC line remain in place. Mediastinal drains have been removed. Stable cardiomegaly. Persistent left greater than right airspace opacities with left pleural effusion. No pneumothorax. IMPRESSION: Stable radiographic appearance of the chest with persistent left greater than right airspace opacities with left pleural effusion. Electronically Signed   By: Duanne Guess D.O.   On: 05/16/2023 13:42     Medications:     Scheduled Medications:  acetaZOLAMIDE  500 mg Oral BID   amitriptyline  50 mg Oral QHS    apixaban  5 mg Oral BID   bisacodyl  10 mg Oral BID   Or   bisacodyl  10 mg Rectal BID   Chlorhexidine Gluconate Cloth  6 each Topical Daily   chlorpheniramine-HYDROcodone  5 mL Oral BID   clopidogrel  75 mg Oral Daily   docusate sodium  200 mg Oral BID   DULoxetine  60 mg Oral BID   feeding supplement  237 mL Oral BID BM   gabapentin  300 mg Oral BID   insulin aspart  0-9 Units Subcutaneous TID WC & HS   insulin glargine-yfgn  12 Units Subcutaneous BID   levothyroxine  25 mcg Oral Daily   linaclotide  145 mcg Oral QAC breakfast   mouth rinse  15 mL Mouth Rinse 4 times per day   pantoprazole  40 mg Oral Daily   rosuvastatin  40 mg Oral Daily    Infusions:  sodium chloride     sodium chloride     amiodarone 30 mg/hr (05/17/23 0931)   milrinone 0.125 mcg/kg/min (05/16/23 1800)   norepinephrine (LEVOPHED) Adult infusion 4 mcg/min (05/17/23 0130)   potassium chloride 10 mEq (05/17/23 0958)    PRN Medications: sodium chloride, dextrose, guaiFENesin, HYDROmorphone, ipratropium-albuterol, lidocaine, methocarbamol, morphine injection, ondansetron (ZOFRAN) IV, mouth rinse, oxyCODONE, traMADol    Patient Profile    [name] is a 57 y.o. 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 Admitted with STEMI s/p PDI to LAD now in cardiogenic shock.    Assessment/Plan:   Cardiogenic shock -> acute HF with mildly-reduced EF complicated by apical VSD.  - Echo 6/23 EF 55%, no RWMA, normal RV, trivial MR - Echo 7/12,  EF 45% - Echo 7/14: EF 60% with apical VSD, normal RV. Apical VSD creates large shunt by RHC QpQs 3.3.  - TEE 7/15 showed EF 55% with apical akinesis and apical VSD with significant L>R flow, RV mildly dilated and dysfunctional.   - OR 7/19 for surgical repair of apical VSD.  - Co-OX 61% on Milrinone 0.25 + NE 4. Stop NE. Can use midodrine if needed for hypotension. Plan  to  stop Milrinone tomorrow if stable.  - Switch IV amiodarone to po. - Start po lasix 40 daily. Completing 4 doses of diamox, CO2 improved. - Remove foley. Start SGLT2i once she is more mobile -Titrate GDMT tomorrow if BP stable off NE - Can stop amiodarone gtt once off inotrope support. -Now on Eliquis 5 BID for felt VSD repair   Anterior STEMI, CAD - HsTrop >24K on admission - s/p PCI to LAD 7/12, post DES 0% residual stenosis. Medical management of 40% stenosed RCA.  - LDL unable to be calculated with high TGD, continue Crestor 40.  - Complicated by infarct-related VSD.  - On Plavix + apixaban  3. Post-MI chest pain -possible pericarditis - Chest pain resolved.    DM II - A1c last 7.6 - SSI while admitted per primary - has diabetic foot ulcer, WOC has seen - ABI 12/22 were normal   Obesity - Body mass index is 37.76 kg/m.  - consider GLP-1 as OP   OSA - Using BIPAP at night   HLD - Continue statin  7. AKI - Scr stable 1.3 today - Follow with diuresis  8. Hyponatremia - Hypervolemic hyponatremia.  - Na 130 - Fluid restrict.   9. Anemia - Hgb 7.8 this am - 1 u RBCs on 07/20 - Transfuse Hgb < 7  10. Thrombocytopenia - IABP now out - Resolved  11. Acute hypoxemic respiratory failure - Now off BiPAP - O2 stable, down to on 3L HFNC - Encourage IS   Continue PT/OT.   Considering CIR once ready for discharge.  FINCH, LINDSAY N 05/17/2023 10:17 AM  Patient seen and examined with the above-signed Advanced Practice Provider and/or Housestaff. I personally reviewed laboratory data, imaging studies and relevant notes. I independently examined the patient and formulated the important aspects of the plan. I have edited the note to reflect any of my changes or salient points. I have personally discussed the plan with the patient and/or family.  Remains on low dose NE and milrinone. MAPs improved this am. No further AF. Still feels weak. + cough. Coox and CVP ok    General:  Sitting in chair. No resp difficulty HEENT: normal Neck: supple. no JVD. Carotids 2+ bilat; no bruits. No lymphadenopathy or thryomegaly appreciated. Cor: Sternal dressing ok . Regular rate & rhythm. No rubs, gallops or murmurs. Lungs: decreased at bases Abdomen: obese soft, nontender, nondistended. No hepatosplenomegaly. No bruits or masses. Good bowel sounds. Extremities: no cyanosis, clubbing, rash, edema s/p transmet amp bilaterally  Neuro: alert & orientedx3, cranial nerves grossly intact. moves all 4 extremities w/o difficulty. Affect pleasant  Progressing slowly. Wean NE to off toda. Can use midodrine as eeded for support. Once NE off will wean milrinone. Encourage IS and ambulation. Hopefully CIR early next week.   CRITICAL CARE Performed by: Arvilla Meres  Total critical care time: 40 minutes  Critical care time was exclusive of separately billable procedures and treating other patients.  Critical care was necessary to treat or prevent imminent or life-threatening deterioration.  Critical care was time spent personally by me (independent of midlevel providers or residents) on the following activities: development of treatment plan with patient and/or surrogate as well as nursing, discussions with consultants, evaluation of patient's response to treatment, examination of patient, obtaining history from patient or surrogate, ordering and performing treatments and interventions, ordering and review of laboratory studies, ordering and review of radiographic studies, pulse oximetry and re-evaluation of patient's condition.  Arvilla Meres, MD  7:50 PM

## 2023-05-17 NOTE — Progress Notes (Signed)
Occupational Therapy Treatment Patient Details Name: [name] MRN: [id number] DOB: 1965/11/13 Today's Date: 05/17/2023   History of present illness Pt is 57 year old presented [facility]  [facility]/04/23 for STEMI and underwent PCI to LAD on 7/12. Pt with cardiogenic shock and acute heart failure. Pt continued with chest pain and found to have apical ventral septal defect. Pt underwent rt heart cath and IABP placed on 7/14. Underwent surgical ventral septal defect repair on 7/19. IABP removed and pt extubated 7/20. 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   OT comments  Pt progressing towards goals this session, able to stand x2 for standing weight and in stedy frame for transfer to chair. Pt needing min cues for recall of sternal precautions, mod A for bed mobility, and mod A +2 for standing in [place] frame/scale with pt placing hands on bars but not pulling with transfer to chair. Pt presenting with impairments listed below, will follow acutely. Patient will benefit from intensive inpatient follow up therapy, >3 hours/day to maximize safety/ind with ADLs/functional mobility.    Recommendations for follow up therapy are one component of a multi-disciplinary discharge planning process, led by the attending physician.  Recommendations may be updated based on patient status, additional functional criteria and insurance authorization.    Assistance Recommended at Discharge Intermittent Supervision/Assistance  Patient can return home with the following  Two people to help with walking and/or transfers;A lot of help with bathing/dressing/bathroom;Assistance with cooking/housework;Assist for transportation;Help with stairs or ramp for entrance   Equipment Recommendations  Other (comment) (defer)     Recommendations for Other Services PT consult    Precautions / Restrictions Precautions Precautions: Fall;Sternal Precaution Booklet Issued: Yes (comment) Precaution Comments: wound vac on chest Restrictions Weight Bearing Restrictions: No Other Position/Activity Restrictions: sternal prec       Mobility Bed Mobility Overal bed mobility: Needs Assistance Bed Mobility: Rolling, Sidelying to Sit   Sidelying to sit: Mod assist            Transfers Overall transfer level: Needs assistance Equipment used: Ambulation equipment used Transfers: Sit to/from Stand, Bed to chair/wheelchair/BSC Sit to Stand: Mod assist, +2 physical assistance           General transfer comment: mod A due to having pt not pull with BUE Transfer via Lift Equipment: Stedy   Balance Overall balance assessment: Needs assistance Sitting-balance support: No upper extremity supported, Feet supported Sitting balance-Leahy Scale: Fair     Standing balance support: Bilateral upper extremity supported, During functional activity Standing balance-Leahy Scale: Poor Standing balance comment: UE support and +2 min assist                           ADL either performed or assessed with clinical judgement   ADL Overall ADL's : Needs assistance/impaired                         Toilet Transfer: Moderate assistance;+2 for physical assistance           Functional mobility during ADLs: Moderate assistance;+2 for physical assistance      Extremity/Trunk Assessment Upper Extremity Assessment Upper Extremity Assessment: Generalized weakness   Lower Extremity Assessment Lower Extremity Assessment: Defer to PT evaluation        Vision   Vision  Assessment?: No apparent visual deficits   Perception Perception Perception: Not tested   Praxis Praxis Praxis: Not tested    Cognition Arousal/Alertness: Awake/alert Behavior During Therapy: Anxious Overall Cognitive Status:  Within Functional Limits for tasks assessed                                          Exercises      Shoulder Instructions       General Comments VSS on supplemental O2    Pertinent Vitals/ Pain       Pain Assessment Pain Assessment: Faces Pain Score: 4  Faces Pain Scale: Hurts little more Pain Location: chest incision Pain Descriptors / Indicators: Operative site guarding, Grimacing Pain Intervention(s): Limited activity within patient's tolerance, Monitored during session, Repositioned  Home Living                                          Prior Functioning/Environment              Frequency  Min 1X/week        Progress Toward Goals  OT Goals(current goals can now be found in the care plan section)  Progress towards OT goals: Progressing toward goals  Acute Rehab OT Goals Patient Stated Goal: to get better OT Goal Formulation: With patient Time For Goal Achievement:  Potential to Achieve Goals: Good ADL Goals Pt Will Perform Grooming: with supervision;sitting Pt Will Perform Upper Body Dressing: with min assist;sitting Pt Will Perform Lower Body Dressing: with mod assist;sit to/from stand;sitting/lateral leans Pt Will Transfer to Toilet: with min assist;squat pivot transfer;stand pivot transfer;bedside commode Additional ADL Goal #1: pt will complete bed mobility with min A in prep for ADLs Additional ADL Goal #2: Pt will be able to verbalize and adhere to sternal prec during 2 ADLs with min cues.  Plan Discharge plan remains appropriate;Frequency remains appropriate    Co-evaluation                 AM-PAC OT "6 Clicks" Daily Activity     Outcome Measure   Help from another person eating meals?: A Little Help from another person taking care of personal grooming?: A Little Help from another person toileting, which includes using toliet, bedpan, or urinal?: A Lot Help from another person bathing  (including washing, rinsing, drying)?: A Lot Help from another person to put on and taking off regular upper body clothing?: A Lot Help from another person to put on and taking off regular lower body clothing?: A Lot 6 Click Score: 14    End of Session Equipment Utilized During Treatment: Other (comment) (stedy)  OT Visit Diagnosis: Unsteadiness on feet (R26.81);Other abnormalities of gait and mobility (R26.89);Muscle weakness (generalized) (M62.81)   Activity Tolerance Patient tolerated treatment well   Patient Left with call bell/phone within reach;in chair   Nurse Communication Mobility status        Time: 9629-5284 OT Time Calculation (min): 34 min  Charges: OT General Charges $OT Visit: 1 Visit OT Treatments $Therapeutic Activity: 23-37 mins  Carver Fila, OTD, OTR/L SecureChat Preferred Acute Rehab [phone]   Carver Fila Koonce 05/17/2023, 3:34 PM

## 2023-05-18 DIAGNOSIS — I2102 ST elevation (STEMI) myocardial infarction involving left anterior descending coronary artery: Secondary | ICD-10-CM | POA: Diagnosis not present

## 2023-05-18 DIAGNOSIS — I232 Ventricular septal defect as current complication following acute myocardial infarction: Secondary | ICD-10-CM | POA: Diagnosis not present

## 2023-05-18 DIAGNOSIS — R570 Cardiogenic shock: Secondary | ICD-10-CM | POA: Diagnosis not present

## 2023-05-18 DIAGNOSIS — J9601 Acute respiratory failure with hypoxia: Secondary | ICD-10-CM | POA: Diagnosis not present

## 2023-05-18 LAB — GLUCOSE, CAPILLARY
Glucose-Capillary: 146 mg/dL — ABNORMAL HIGH (ref 70–99)
Glucose-Capillary: 134 mg/dL — ABNORMAL HIGH (ref 70–99)
Glucose-Capillary: 86 mg/dL (ref 70–99)
Glucose-Capillary: 89 mg/dL (ref 70–99)

## 2023-05-18 MED ORDER — POTASSIUM CHLORIDE CRYS ER 20 MEQ PO TBCR
40.0000 meq | EXTENDED_RELEASE_TABLET | Freq: Once | ORAL | Status: AC
  Administered 2023-05-18: 40 meq via ORAL
  Filled 2023-05-18: qty 2

## 2023-05-18 MED ORDER — POLYETHYLENE GLYCOL 3350 17 G PO PACK
17.0000 g | PACK | Freq: Every day | ORAL | Status: DC
  Administered 2023-05-18 – 2023-05-23 (×3): 17 g via ORAL
  Filled 2023-05-18 (×3): qty 1

## 2023-05-18 MED ORDER — TORSEMIDE 20 MG PO TABS
40.0000 mg | ORAL_TABLET | Freq: Every day | ORAL | Status: DC
  Administered 2023-05-18 – 2023-05-21 (×4): 40 mg via ORAL
  Filled 2023-05-18 (×4): qty 2

## 2023-05-18 MED ORDER — SORBITOL 70 % SOLN
30.0000 mL | Freq: Every day | Status: AC
  Administered 2023-05-19: 30 mL via ORAL
  Filled 2023-05-18: qty 30

## 2023-05-18 MED ORDER — SORBITOL 70 % SOLN: 30.0000 mL | Freq: Every day | Status: DC

## 2023-05-18 MED ORDER — SORBITOL 70 % SOLN
30.0000 mL | Freq: Once | Status: AC
  Administered 2023-05-18: 30 mL via ORAL
  Filled 2023-05-18: qty 30

## 2023-05-18 MED ORDER — SPIRONOLACTONE 12.5 MG HALF TABLET
12.5000 mg | ORAL_TABLET | Freq: Every day | ORAL | Status: DC
  Administered 2023-05-18 – 2023-05-20 (×3): 12.5 mg via ORAL
  Filled 2023-05-18 (×3): qty 1

## 2023-05-18 MED ORDER — MIDODRINE HCL 5 MG PO TABS: 2.5000 mg | ORAL_TABLET | Freq: Two times a day (BID) | ORAL | Status: DC

## 2023-05-18 NOTE — Discharge Instructions (Addendum)
Information on my medicine - ELIQUIS (apixaban)  This medication education was reviewed with me or my healthcare representative as part of my discharge preparation.  The pharmacist that spoke with me during my hospital stay was:  Gardner Candle, [facility]  Why was Eliquis prescribed for you? Eliquis was prescribed for you to reduce the risk of forming blood clots that can cause a stroke after heart surgery with prosthetic material.  What do You need to know about Eliquis ? Take your Eliquis TWICE DAILY - one tablet in the morning and one tablet in the evening with or without food.  It would be best to take the doses about the same time each day.  If you have difficulty swallowing the tablet whole please discuss with your pharmacist how to take the medication safely.  Take Eliquis exactly as prescribed by your doctor and DO NOT stop taking Eliquis without talking to the doctor who prescribed the medication.  Stopping may increase your risk of developing a new clot or stroke.  Refill your prescription before you run out.  After discharge, you should have regular check-up appointments with your healthcare provider that is prescribing your Eliquis.  In the future your dose may need to be changed if your kidney function or weight changes by a significant amount or as you get older.  What do you do if you miss a dose? If you miss a dose, take it as soon as you remember on the same day and resume taking twice daily.  Do not take more than one dose of ELIQUIS at the same time.  Important Safety Information A possible side effect of Eliquis is bleeding. You should call your healthcare provider right away if you experience any of the following: Bleeding from an injury or your nose that does not stop. Unusual colored urine (red or dark brown) or unusual colored stools (red or black). Unusual bruising for unknown reasons. A serious fall or if you hit your head (even if there is no  bleeding).  Some medicines may interact with Eliquis and might increase your risk of bleeding or clotting while on Eliquis. To help avoid this, consult your healthcare provider or pharmacist prior to using any new prescription or non-prescription medications, including herbals, vitamins, non-steroidal anti-inflammatory drugs (NSAIDs) and supplements.  This website has more information on Eliquis (apixaban): [email or web address].cy.     Discharge Instructions:  1. You may shower, please wash incisions daily with soap and water and keep dry.  If you wish to cover wounds with dressing you may do so but please keep clean and change daily.  No tub baths or swimming until incisions have completely healed.  If your incisions become red or develop any drainage please call our office at [phone]  2. No Driving until cleared by Dr. Lucilla Lame office and you are no longer using narcotic pain medications  3. Monitor your weight daily.. Please use the same scale and weigh at same time... If you gain 5-10 lbs in 48 hours with associated lower extremity swelling, please contact our office at [phone]  4. Fever of 101.5 for at least 24 hours with no source, please contact our office at [phone]  5. Activity- up as tolerated, please walk at least 3 times per day.  Avoid strenuous activity, no lifting, pushing, or pulling with your arms over 8-10 lbs for a minimum of 6 weeks  6. If any questions or concerns arise, please do not hesitate to contact our office at [phone]

## 2023-05-18 NOTE — Progress Notes (Signed)
Pt placed on BIPAP and is resting comfortably. RT will monitor as needed.   05/18/23 0000  BiPAP/CPAP/SIPAP  $ Non-Invasive Ventilator  Non-Invasive Vent Subsequent  BiPAP/CPAP/SIPAP Pt Type Adult  BiPAP/CPAP/SIPAP V60  Mask Type Full face mask  Mask Size Medium  Set Rate 10 breaths/min  Respiratory Rate 17 breaths/min  IPAP 16 cmH20  EPAP 6 cmH2O  FiO2 (%) 50 %  Minute Ventilation 12  Leak 27  Peak Inspiratory Pressure (PIP) 18  Tidal Volume (Vt) 564  Patient Home Equipment No  Auto Titrate No  Press High Alarm 25 cmH2O  Press Low Alarm 5 cmH2O  CPAP/SIPAP surface wiped down Yes  Oxygen Percent 100 %

## 2023-05-18 NOTE — Progress Notes (Signed)
CT surgery p.m. rounds  Maintaining sinus rhythm Better diuresis today for increased weight Pulmonary status slowly improving, will recheck chest x-ray tomorrow  Blood pressure (!) 124/109, pulse 67, temperature 98.7 F (37.1 C), temperature source Oral, resp. rate (!) 23, height 5\' 4"  (1.626 m), weight 104.6 kg, SpO2 92%  Total I/O In: 59.2 [I.V.:59.2] Out: 1175 [Urine:1175] .   Marland Kitchen

## 2023-05-18 NOTE — Discharge Summary (Incomplete)
Physician Discharge Summary  Patient ID: [name] MRN: [id number] DOB/AGE: 24-Dec-1965 57 y.o.  Admit date: 05/01/2023 Discharge date: 05/25/2023  Admission Diagnoses: Acute anterior STEMI Type 2 diabetes mellitus Hypothyroidism Obstructive sleep apnea Morbid obesity  Discharge Diagnoses:   ST elevation myocardial infarction involving left anterior descending (LAD) coronary artery (HCC)  Ventricular septal defect as complication of acute myocardial infarction (HCC) Type 2 diabetes mellitus Hypothyroidism Obstructive sleep apnea Expected acute blood loss anemia and thrombocytopenia Acute heart failure with reduced EF and cardiogenic shock Acute respiratory failure Acute kidney injury due to ischemic ATN Dyslipidemia Morbid obesity   Discharged Condition: stable  Referring: No ref. provider found Primary Care: Stacey Laroche, [name] Primary Cardiologist:Stacey [place], MD  History of Present Illness:     [name] 57 y.o. 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  She has a hx of DM and presented with an anterior STEMI.  She underwent PCI to the LAD with a good result.  Advanced heart failure team was consulted due to persistent hypotension and tachycardia, and she was started on chemical support.  Bedside echo showed the VSD.  CT surgery has been consulted to assist with management. She remains symptomatic with some shortness of breath, and chest pain. I would like to discuss with structural team to see if she is a candidate for a percutaneous closure device. If not, we would need to consider open repair, which will be difficult given her new stent. We also would need to consider options for mechanical support if she develops worsening LV failure after open repair.  Hospital Course:  [name] was not felt to be an appropriate candidate for a percutaneous closure device. She was medical optimized over the next few days and was taken to the OR on  05/11/23 where the VSD was repaired.  Following the procedure she was transferred back to the ICU on epinephrine, nor-epinephrine, and milrinone.  The IABP remained in place at 1:1 ratio. The critical care medicine team assisted with management in the ICU setting. She was weaned from the vent and extubated on POD1. The IABP was weaned down to a 1:3 ratio and removed on post-op day 1.  Her diabetes was managed initially with an insulin infusion and later transitioned back to long-acting insulin and SSI. By post-op day 2, the dobutamine was weaned off but NE, milrinone, and Vasopressin continued. Pacer wires were removed and Plavix was started for the recent LAD stent. She had an episode of coughing with hypoxia and bradycardia in the early morning on post-op day 3.  Hypoxia improved with  utilization of a bag valved mask and she was then placed on BiPAP. She was given a dose of Narcan and diuresed more aggressively with a Lasix infusion.  Expected acute blood loss anemia and thrombocytopenia were monitored closely post-op.  She was transfused with 1 unit PRBC's on post-op day 1.  The platelet count gradually normalized without intervention.  Acute kidney injury was monitored closely with gradual improvement in creatinine. The vasopressor and inotropic support were slowly weaned.  She has expected postoperative volume overload and is diuresing accordingly.  She did develop some right quadricep weakness and a CT of the abdomen is being obtained.  This is to rule out right pelvic hematoma.  [name] and recommended inpatient rehab for additional strengthening prior to planned discharge to  home.  The CIR team evaluated her as well and felt she was an appropriate candidate for CIR admission and initiated the process for getting insurance approval.  She was resumed on Eliquis for felt repair  of VSD. WBC this am slightly increased to 18,600 to 07/31 but she remains afebrile. Pro calcitonin was 0.15. Per pulmonary, she was put on Maxipime and Vancomycin for possible pulmonary etiology (increased cough, shortness of breath). She has had a PICC line since 07/12. She has no sign of a sternal skin wound infection, no GU complaints (and UA negative for infection), and she had previous atelectasis (but no obvious pneumonia) on CXR. Event of chest pain 08/01 with rapid response involvement. EKG showed SR with first degree heart block;no evidence of ischemia. CT of chest/abd/pelvis showed slight increase in pericardial fluid collection, fluid extending through the subcutaneous tissues, slight rim enhancement of the pericardial portion of the fluid collection, and superimposed infection cannot be excluded. She will need to go to OR next week for I and D of sternum for infection/dehiscence.  Consults: cardiology and pulmonary/intensive care  Significant Diagnostic Studies:   Treatments: 05/11/2023 Patient:  [name] Pre-Op Dx: Post infarct VSD   Post-op Dx:  same Procedure Narrative & Impression  CLINICAL DATA:  [id number] CHF following cardiac surgery, postop [id number]   EXAM: PORTABLE CHEST - 1 VIEW   COMPARISON:  05/23/2023   FINDINGS: Lordotic projection. The mediastinal contours are within normal limits. Unchanged cardiomegaly. Similar appearance of left upper extremity PICC with the catheter tip near the cavoatrial junction. Low lung volumes. The lungs are otherwise clear bilaterally without evidence of focal consolidation, pleural effusion, or pneumothorax. No acute osseous abnormality. Similar appearance of median sternotomy wires.   IMPRESSION: 1. Examination is limited by lordotic positioning. 2. Low lung volumes. No acute cardiopulmonary process. 3. Unchanged cardiomegaly.     Electronically Signed   By: Marliss Coots M.D.   On: 05/24/2023 08:11  : Patch closure of VSD      Surgeon and Role:      * Lightfoot, Eliezer Lofts, MD - Primary    * Stacey Specter, MD - Co-surgeon    Anesthesia  general EBL:  1000 ml Blood Administration: See anesthesia record Xclamp Time:  88 min     Drains: 19 F blake drain: R, L,  24F mediastinal X 2  Wires: ventricular Counts: correct     Indications: 57yo female with post-infarct VSD. She had a DES placed to the LAD on 7/12, and is currently on brilinta. Echo was reviewed, and the VSD is very apical. She remains symptomatic with some shortness of breath, and chest pain. I would like to discuss with structural team to see if she is a candidate for a percutaneous closure device. If not, we would need to consider open repair, which will be difficult given her new stent. We also would need to consider options for mechanical support if she develops worsening LV failure after open repair.    Findings: Friable necrotic muscle along the septum.  Initial repair was unsuccessful in that the sutures pulled through.  The septum was reconstructed with a double layer felt patch.    Discharge Exam: Blood pressure (!) 108/51, pulse 64, temperature 97.6 F (36.4 C), temperature source Oral, resp. rate 16, height 5\' 4"  (1.626 m), weight 96.7 kg, SpO2 99%. {physical [id number]}  Disposition:   Discharge Instructions     AMB Referral to Cardiac Rehabilitation - Phase II   Complete by: As  directed    Diagnosis: STEMI   After initial evaluation and assessments completed: Virtual Based Care may be provided alone or in conjunction with Phase 2 Cardiac Rehab based on patient barriers.: Yes   Intensive Cardiac Rehabilitation (ICR) [facility] location only OR Traditional Cardiac Rehabilitation (TCR) *If criteria for ICR are not met will enroll in TCR [facility] only): Yes      Allergies as of 05/25/2023       Reactions   Ibuprofen Other (See Comments)   Pt is unable to take this due to kidney problems.     Penicillins Rash, Other (See Comments)    Tolerated ceftriaxone and cefepime in past Has patient had a PCN reaction causing immediate rash, facial/tongue/throat swelling, SOB or lightheadedness with hypotension: No Has patient had a PCN reaction causing severe rash involving mucus membranes or skin necrosis: No Has patient had a PCN reaction that required hospitalization No Has patient had a PCN reaction occurring within the last 10 years: No If all of the above answers are "NO", then may proceed with Cephalosporin use.     Med Rec must be completed prior to using this [facility]***       The patient has been discharged on:   1.Beta Blocker:  Yes [   ]                              No   [ x  ]                              If No, reason:Low EF, cardiogenic  2.Ace Inhibitor/ARB: Yes [   ]                                     No  [   x ]                                     If No, reason:  3.Statin:   Yes [ x  ]                  No  [   ]                  If No, reason:  4.Ecasa:  Yes  [   ]                  No   [ x  ]                  If No, reason:   5. ACS on Admission?  P2Y12 Inhibitor:  Yes  [  x ] Yes                                No  [  ]    Signed: Ardelle Balls PA-C 05/25/2023, 9:50 AM

## 2023-05-18 NOTE — Progress Notes (Signed)
NAME:  [name], MRN:  [id number], DOB:  1966/10/19, LOS: 14 ADMISSION DATE:  05/04/2023, CONSULTATION DATE: 05/11/2023 REFERRING MD: Dr. Cliffton Asters, CHIEF COMPLAINT: Chest pain  History of Present Illness:  57 year old female with a diabetes melitis, complicated with neuropathy and Wegener's granulomatosis who was initially admitted with acute anterior STEMI underwent PCI to LAD, who continues to require vasopressor support and IABP, echocardiogram showed mechanical complication including apical VSD, underwent VSD repair, remained intubated, on multiple vasopressor support, PCCM was consulted for help evaluation medical management  Pertinent  Medical History   Past Medical History:  Diagnosis Date   Chronic cough    Chronic pain    Diabetes mellitus without complication (HCC)    Dyspnea    GERD (gastroesophageal reflux disease)    Hypokalemia    Hypothyroidism    Left wrist pain 06/01/2021   Myonecrosis (HCC) 07/01/2021   Neurocardiogenic syncope    OSA (obstructive sleep apnea)    does not use CPAP   Osteomyelitis (HCC)    bilateral feet   Peripheral neuropathy    Presence of intrathecal pump    recieves Prialt/bupivicaine   Tear of gluteus medius tendon 07/01/2021   Wegener's granulomatosis 2009   Wegner's disease (congenital syphilitic osteochondritis)      Significant Hospital Events: Including procedures, antibiotic start and stop dates in addition to other pertinent events     Interim History / Subjective:  Continues to complain of surgical site pain Stated could not sleep because of this pain Levophed was titrated off Remains on milrinone 0.125 Coox 62%   Objective   Blood pressure (!) 153/90, pulse 69, temperature 98 F (36.7 C), temperature source Oral, resp. rate 18, height 5\' 4"  (1.626 m), weight 104.6 kg, SpO2 100%. CVP:  [2 mmHg-15 mmHg] 3 mmHg  FiO2 (%):  [50 %] 50 %   Intake/Output Summary (Last 24 hours) at 05/18/2023 0913 Last data filed at 05/18/2023  0600 Gross per 24 hour  Intake 579.23 ml  Output 1375 ml  Net -795.77 ml   Filed Weights   05/16/23 0451 05/17/23 1700 05/18/23 0500  Weight: 103.9 kg 102.6 kg 104.6 kg    Examination: General: Chronically ill-appearing female, lying on the bed HEENT: [place]/AT, eyes anicteric.  moist mucus membranes Neuro: Alert, awake following commands Chest: Central sternotomy surgical incision is well-dressed, coarse breath sounds, no wheezes or rhonchi Heart: Regular rate and rhythm, no murmurs or gallops Abdomen: Soft, nontender, nondistended, bowel sounds present Skin: No rash  Labs and images were reviewed Coox 62%, Na 130, K 3.9, Cr 1.34 Serum bicarbonate 27 Hb 7.7, PLT 412  Urine output 1.6 L in last 24 hours with net -1L  Resolved Hospital Problem list   Thrombocytopenia  Assessment & Plan:  Coronary artery disease, presented with acute anterior STEMI, complicated with apical VSD. Apical VSD s/p surgical repair S/p PCI to LAD with DES  Continue Plavix and apixaban Continue statin Continue pain control with tramadol, oxycodone and Dilaudid Encourage incentive spirometry and out of bed to chair  Acute HFrEF with cardiogenic shock IABP was discontinued on 7/20 Patient ejection fraction is 45% Levophed was titrated off Will stop milrinone Advanced heart failure team is following Surgical 6 to torsemide 40 mg daily Start spironolactone  Acute respiratory failure with hypoxia/hypercapnia, due to pulmonary edema Mucous plugging Oxygen was titrated off this morning Encourage incentive spirometry and ambulation  Hyperlipidemia Continue Crestor  Acute kidney injury due to ischemic ATN Hypervolemic hyponatremia Hypokalemia, corrected Serum creatinine stable  around 1.2-1.3 now Avoid nephrotoxic agent Closely monitor electrolytes Serum sodium corrected to 130 Serum K remain 3.9 Continue aggressive electrolyte replacement  Metabolic alkalosis Serum bicarbonate trended down  to 27 Discontinue acetazolamide  Diabetes type 2 Patient hemoglobin A1c is 7.6 Continue sliding scale insulin and Semglee  Expected perioperative blood loss anemia H&H and platelet counts are stable now S/p1 unit PRBC few days ago Monitor H&H and platelet count  Morbid obesity OSA Diet and exercise counseling provided BiPAP overnight  Best Practice (right click and "Reselect all SmartList Selections" daily)   Diet/type: Regular consistency DVT prophylaxis: Apixaban GI prophylaxis: PPI Lines: Central line, yes still needed  Foley:  Yes, and it is still needed Code Status:  full code Last date of multidisciplinary goals of care discussion [Per primary team]  Labs   CBC: Recent Labs  Lab 05/13/23 0908 05/13/23 2259 05/14/23 0523 05/14/23 0955 05/15/23 0427 05/16/23 0414 05/17/23 0355 05/18/23 0340  WBC 18.0*  --  18.0*  --  13.1* 11.3* 11.7* 9.8  NEUTROABS 12.9*  --   --   --   --   --   --   --   HGB 7.3*   < > 7.6* 8.2* 8.0* 8.1* 7.8* 7.7*  HCT 23.4*   < > 23.7* 24.0* 24.5* 25.8* 25.9* 24.9*  MCV 90.0  --  91.9  --  88.4 90.8 90.9 92.6  PLT 105*  --  146*  --  226 299 358 412*   < > = values in this interval not displayed.    Basic Metabolic Panel: Recent Labs  Lab 05/14/23 0523 05/14/23 0955 05/15/23 0427 05/16/23 0414 05/16/23 1847 05/17/23 0355 05/17/23 0948 05/17/23 1740 05/18/23 0340  NA 131*   < > 133* 135 130* 130* 132* 129* 130*  K 4.3   < > 3.0* 2.9* 2.7* 3.0* 3.1* 3.8 3.9  CL 90*   < > 87* 87* 86* 88* 87* 92* 94*  CO2 29   < > 35* 39* 34* 32 31 27 27   GLUCOSE 165*   < > 197* 142* 258* 146* 166* 207* 125*  BUN 17   < > 18 17 16 15 14 17 18   CREATININE 1.60*   < > 1.55* 1.42* 1.50* 1.31* 1.22* 1.28* 1.34*  CALCIUM 7.7*   < > 8.0* 8.0* 7.9* 8.0* 8.5* 8.1* 8.3*  MG 2.1  --  1.9 2.0  --  1.9  --   --  2.4   < > = values in this interval not displayed.   GFR: Estimated Creatinine Clearance: 54.6 mL/min (A) (by C-G formula based on SCr of 1.34  mg/dL (H)). Recent Labs  Lab 05/15/23 0427 05/16/23 0414 05/17/23 0355 05/18/23 0340  WBC 13.1* 11.3* 11.7* 9.8    Liver Function Tests: Recent Labs  Lab 05/11/23 1936  AST 51*  ALT 15  ALKPHOS 64  BILITOT 0.5  PROT 4.3*  ALBUMIN 2.8*   No results for input(s): "LIPASE", "AMYLASE" in the last 168 hours. No results for input(s): "AMMONIA" in the last 168 hours.  ABG    Component Value Date/Time   PHART 7.417 05/14/2023 0955   PCO2ART 49.4 (H) 05/14/2023 0955   PO2ART 116 (H) 05/14/2023 0955   HCO3 32.0 (H) 05/14/2023 0955   TCO2 33 (H) 05/14/2023 0955   ACIDBASEDEF 3.0 (H) 05/13/2023 2259   O2SAT 61.7 05/18/2023 0340     Coagulation Profile: Recent Labs  Lab 05/11/23 1441  INR 1.5*    Cardiac  Enzymes: No results for input(s): "CKTOTAL", "CKMB", "CKMBINDEX", "TROPONINI" in the last 168 hours.  HbA1C: HbA1c, POC (controlled diabetic range)  Date/Time Value Ref Range Status  04/03/2023 05:04 PM 7.5 (A) 0.0 - 7.0 % Final   Hgb A1c MFr Bld  Date/Time Value Ref Range Status  05/04/2023 06:20 AM 7.6 (H) 4.8 - 5.6 % Final    Comment:    (NOTE)         Prediabetes: 5.7 - 6.4         Diabetes: >6.4         Glycemic control for adults with diabetes: <7.0   01/04/2023 01:12 PM 7.6 (H) 4.8 - 5.6 % Final    Comment:             Prediabetes: 5.7 - 6.4          Diabetes: >6.4          Glycemic control for adults with diabetes: <7.0     CBG: Recent Labs  Lab 05/16/23 2047 05/17/23 0843 05/17/23 1244 05/17/23 1745 05/17/23 2001  GLUCAP 164* 155* 176* 179* 188*      Cheri Fowler, MD [facility] See Amion for pager If no response to pager, please call [phone] until 7pm After 7pm, Please call E-link [phone]

## 2023-05-18 NOTE — Progress Notes (Signed)
Pt placed on BIPAP for the night and is resting comfortably. RT will monitor as needed.     05/18/23 2349  BiPAP/CPAP/SIPAP  $ Non-Invasive Ventilator  Non-Invasive Vent Subsequent  BiPAP/CPAP/SIPAP Pt Type Adult  BiPAP/CPAP/SIPAP V60  Mask Type Full face mask  Mask Size Medium  Set Rate 10 breaths/min  Respiratory Rate 12 breaths/min  IPAP 16 cmH20  EPAP 6 cmH2O  PEEP 6 cmH20  FiO2 (%) 50 %  Minute Ventilation 8.8  Leak 15  Peak Inspiratory Pressure (PIP) 17  Tidal Volume (Vt) 583  Patient Home Equipment No  Auto Titrate No  Press High Alarm 25 cmH2O  Press Low Alarm 5 cmH2O  Nasal massage performed Yes  CPAP/SIPAP surface wiped down Yes  BiPAP/CPAP /SiPAP Vitals  BP 120/81  MEWS Score/Color  MEWS Score 0  MEWS Score Color Green

## 2023-05-18 NOTE — Progress Notes (Addendum)
Patient ID: [name], female   DOB: 1966/03/07, 57 y.o.   MRN: [id number]    Advanced Heart Failure Rounding Note   Subjective:    7/14: RHC mean RA 21, PA 50/25 mean 35, mean PCWP 28, CI 1.8 F/2.6 T, QpQs 3.3.  IABP placed.  7/19: Surgical repair of apical VSD.  7/20: IABP removed, extubated.   On milrinone 0.125 off NE Co-ox 62%  Feels ok. Weak. No sob. Weight trending up slightly.    Objective:     Vital Signs:   Temp:  [96.6 F (35.9 C)-99.9 F (37.7 C)] 97.7 F (36.5 C) (07/26 0030) Pulse Rate:  [64-69] 65 (07/26 0600) Resp:  [12-36] 21 (07/26 0600) BP: (111-179)/(52-129) 126/64 (07/26 0600) SpO2:  [85 %-100 %] 100 % (07/26 0600) FiO2 (%):  [50 %] 50 % (07/26 0030) Weight:  [102.6 kg-104.6 kg] 104.6 kg (07/26 0500) Last BM Date : 05/15/23  Weight change: Filed Weights   05/16/23 0451 05/17/23 1700 05/18/23 0500  Weight: 103.9 kg 102.6 kg 104.6 kg    Intake/Output:   Intake/Output Summary (Last 24 hours) at 05/18/2023 4098 Last data filed at 05/18/2023 0500 Gross per 24 hour  Intake 644.15 ml  Output 1615 ml  Net -970.85 ml     Physical Exam: General:  Sitting up in bed NAD HEENT: normal Neck: supple. no JVD. Carotids 2+ bilat; no bruits. No lymphadenopathy or thryomegaly appreciated. Cor: Sternal dressing  Regular rate & rhythm. No rubs, gallops or murmurs. Lungs: clear decreased at bases Abdomen: soft, nontender, nondistended. No hepatosplenomegaly. No bruits or masses. Good bowel sounds. Extremities: no cyanosis, clubbing, rash, edema s/p b/l TMA Neuro: alert & orientedx3, cranial nerves grossly intact. moves all 4 extremities w/o difficulty. Affect pleasant   Telemetry: Sinus 60s Personally reviewed  Labs: Basic Metabolic Panel: Recent Labs  Lab 05/14/23 0523 05/14/23 0955 05/15/23 0427 05/16/23 0414 05/16/23 1847 05/17/23 0355 05/17/23 0948 05/17/23 1740 05/18/23 0340  NA 131*   < > 133* 135 130* 130* 132* 129* 130*  K 4.3   < >  3.0* 2.9* 2.7* 3.0* 3.1* 3.8 3.9  CL 90*   < > 87* 87* 86* 88* 87* 92* 94*  CO2 29   < > 35* 39* 34* 32 31 27 27   GLUCOSE 165*   < > 197* 142* 258* 146* 166* 207* 125*  BUN 17   < > 18 17 16 15 14 17 18   CREATININE 1.60*   < > 1.55* 1.42* 1.50* 1.31* 1.22* 1.28* 1.34*  CALCIUM 7.7*   < > 8.0* 8.0* 7.9* 8.0* 8.5* 8.1* 8.3*  MG 2.1  --  1.9 2.0  --  1.9  --   --  2.4   < > = values in this interval not displayed.    Liver Function Tests: Recent Labs  Lab 05/11/23 1936  AST 51*  ALT 15  ALKPHOS 64  BILITOT 0.5  PROT 4.3*  ALBUMIN 2.8*    No results for input(s): "LIPASE", "AMYLASE" in the last 168 hours. No results for input(s): "AMMONIA" in the last 168 hours.  CBC: Recent Labs  Lab 05/13/23 0908 05/13/23 2259 05/14/23 0523 05/14/23 0955 05/15/23 0427 05/16/23 0414 05/17/23 0355 05/18/23 0340  WBC 18.0*  --  18.0*  --  13.1* 11.3* 11.7* 9.8  NEUTROABS 12.9*  --   --   --   --   --   --   --   HGB 7.3*   < > 7.6* 8.2*  8.0* 8.1* 7.8* 7.7*  HCT 23.4*   < > 23.7* 24.0* 24.5* 25.8* 25.9* 24.9*  MCV 90.0  --  91.9  --  88.4 90.8 90.9 92.6  PLT 105*  --  146*  --  226 299 358 412*   < > = values in this interval not displayed.    Cardiac Enzymes: No results for input(s): "CKTOTAL", "CKMB", "CKMBINDEX", "TROPONINI" in the last 168 hours.  BNP: BNP (last 3 results) Recent Labs    05/04/23 0620  BNP 123.2*    ProBNP (last 3 results) No results for input(s): "PROBNP" in the last 8760 hours.    Other results:  Imaging: DG Chest Port 1 View  Result Date: 05/16/2023 CLINICAL DATA:  Chest pain EXAM: PORTABLE CHEST 1 VIEW COMPARISON:  05/15/2023 FINDINGS: Left IJ sheath and left-sided PICC line remain in place. Mediastinal drains have been removed. Stable cardiomegaly. Persistent left greater than right airspace opacities with left pleural effusion. No pneumothorax. IMPRESSION: Stable radiographic appearance of the chest with persistent left greater than right  airspace opacities with left pleural effusion. Electronically Signed   By: Duanne Guess D.O.   On: 05/16/2023 13:42     Medications:     Scheduled Medications:  amiodarone  200 mg Oral BID   amitriptyline  50 mg Oral QHS   apixaban  5 mg Oral BID   bisacodyl  10 mg Oral BID   Or   bisacodyl  10 mg Rectal BID   Chlorhexidine Gluconate Cloth  6 each Topical Daily   chlorpheniramine-HYDROcodone  5 mL Oral BID   clopidogrel  75 mg Oral Daily   docusate sodium  200 mg Oral BID   DULoxetine  60 mg Oral BID   feeding supplement  237 mL Oral BID BM   furosemide  40 mg Oral Daily   gabapentin  300 mg Oral BID   insulin aspart  0-9 Units Subcutaneous TID WC & HS   insulin glargine-yfgn  12 Units Subcutaneous BID   levothyroxine  25 mcg Oral Daily   linaclotide  145 mcg Oral QAC breakfast   mouth rinse  15 mL Mouth Rinse 4 times per day   pantoprazole  40 mg Oral Daily   rosuvastatin  40 mg Oral Daily    Infusions:  sodium chloride     sodium chloride     milrinone 0.125 mcg/kg/min (05/17/23 1739)   norepinephrine (LEVOPHED) Adult infusion 4 mcg/min (05/17/23 1000)    PRN Medications: sodium chloride, dextrose, guaiFENesin, HYDROmorphone, ipratropium-albuterol, lidocaine, methocarbamol, morphine injection, ondansetron (ZOFRAN) IV, mouth rinse, oxyCODONE, traMADol    Patient Profile    [name] is a 57 y.o. 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 Admitted with STEMI s/p PDI to LAD now in cardiogenic shock.    Assessment/Plan:   Cardiogenic shock -> acute HF with mildly-reduced EF complicated by apical VSD.  - Echo 6/23 EF 55%, no RWMA, normal RV, trivial MR - Echo 7/12,  EF 45% - Echo 7/14: EF 60% with apical VSD, normal RV. Apical VSD creates large shunt by RHC QpQs 3.3.  - TEE 7/15 showed EF 55% with apical akinesis and apical VSD with significant L>R flow,  RV mildly dilated and dysfunctional.   - OR 7/19 for surgical repair of apical VSD.  - Co-OX 61% on Milrinone 0.125. Stop milrinone - Switch lasix 40 daily to torsemide 40 daily -. Start  SGLT2i once she is more mobile -Titrate GDMT once off pressors - Add spiro 12.5  Anterior STEMI, CAD - HsTrop >24K on admission - s/p PCI to LAD 7/12, post DES 0% residual stenosis. Medical management of 40% stenosed RCA.  - LDL unable to be calculated with high TGD, continue Crestor 40.  - Complicated by infarct-related VSD.  - On Plavix + apixaban + statin    DM II - A1c last 7.6 - SSI while admitted per primary - has diabetic foot ulcer, WOC has seen - ABI 12/22 were normal  4. PAF - in NSR on po amio - Eliquis restarted   5. Obesity - Body mass index is 37.76 kg/m.  - consider GLP-1 as OP   6. OSA - Using BIPAP at night  7. AKI - Resolved  8. Hyponatremia - Hypervolemic hyponatremia.  - Na stable at130 - Fluid restrict.   9. Anemia - Hgb 7.7 this am - 1 u RBCs on 07/20 - Transfuse Hgb < 7  10. Thrombocytopenia - IABP now out - Resolved  11. Acute hypoxemic respiratory failure - Now off BiPAP - O2 stable, down to on 3L HFNC - Encourage IS  Continue PT/OT.   Considering CIR once ready for discharge.  CRITICAL CARE Performed by: Arvilla Meres  Total critical care time: 40 minutes  Critical care time was exclusive of separately billable procedures and treating other patients.  Critical care was necessary to treat or prevent imminent or life-threatening deterioration.  Critical care was time spent personally by me (independent of midlevel providers or residents) on the following activities: development of treatment plan with patient and/or surrogate as well as nursing, discussions with consultants, evaluation of patient's response to treatment, examination of patient, obtaining history from patient or surrogate, ordering and performing treatments and  interventions, ordering and review of laboratory studies, ordering and review of radiographic studies, pulse oximetry and re-evaluation of patient's condition.   Arvilla Meres MD 05/18/2023 6:27 AM

## 2023-05-18 NOTE — Progress Notes (Signed)
      [address]       [place] [address]             [phone]                 7 Days Post-Op Procedure(s) (LRB): VENTRICULAR SEPTAL DEFECT (VSD) REPAIR (N/A) TRANSESOPHAGEAL ECHOCARDIOGRAM (N/A)   Events: No events _______________________________________________________________ Vitals: BP (!) 153/90   Pulse 69   Temp 98 F (36.7 C) (Oral)   Resp 18   Ht 5\' 4"  (1.626 m)   Wt 104.6 kg   SpO2 100%   BMI 39.58 kg/m  Filed Weights   05/16/23 0451 05/17/23 1700 05/18/23 0500  Weight: 103.9 kg 102.6 kg 104.6 kg     - Neuro: alert NAD  - Cardiovascular: sinus  Drips:   milr 0.125  CVP:  [2 mmHg-15 mmHg] 3 mmHg  - Pulm: EWOB FiO2 (%):  [50 %] 50 %  ABG    Component Value Date/Time   PHART 7.417 05/14/2023 0955   PCO2ART 49.4 (H) 05/14/2023 0955   PO2ART 116 (H) 05/14/2023 0955   HCO3 32.0 (H) 05/14/2023 0955   TCO2 33 (H) 05/14/2023 0955   ACIDBASEDEF 3.0 (H) 05/13/2023 2259   O2SAT 61.7 05/18/2023 0340    - Abd: ND - Extremity: warm  .Intake/Output      07/25 0701 07/26 0700 07/26 0701 07/27 0700   P.O. 480    I.V. (mL/kg) 170.3 (1.6)    IV Piggyback 1.3    Total Intake(mL/kg) 651.6 (6.2)    Urine (mL/kg/hr) 1615 (0.6)    Drains 0    Total Output 1615    Net -963.4            _______________________________________________________________ Labs:    Latest Ref Rng & Units 05/18/2023    3:40 AM 05/17/2023    3:55 AM 05/16/2023    4:14 AM  CBC  WBC 4.0 - 10.5 K/uL 9.8  11.7  11.3   Hemoglobin 12.0 - 15.0 g/dL 7.7  7.8  8.1   Hematocrit 36.0 - 46.0 % 24.9  25.9  25.8   Platelets 150 - 400 K/uL 412  358  299       Latest Ref Rng & Units 05/18/2023    3:40 AM 05/17/2023    5:40 PM 05/17/2023    9:48 AM  CMP  Glucose 70 - 99 mg/dL 510  258  527   BUN 6 - 20 mg/dL 18  17  14    Creatinine 0.44 - 1.00 mg/dL 7.82  4.23  5.36   Sodium 135 - 145 mmol/L 130  129  132   Potassium 3.5 - 5.1 mmol/L 3.9  3.8  3.1   Chloride 98 -  111 mmol/L 94  92  87   CO2 22 - 32 mmol/L 27  27  31    Calcium 8.9 - 10.3 mg/dL 8.3  8.1  8.5     CXR: stable  _______________________________________________________________  Assessment and Plan: POD 7 s/p VSD repair  Neuro: pain controlled CV: appreciate heart failure team.  Wean gtts per their recs.   On plavix for stent Pulm: IS, ambulation Renal:   Watching creat GI: on diet Heme: on plavix.  On eliquis for felt repair ID: afebrile Endo: SSI Dispo: continue ICU care   Corliss Skains 05/18/2023 8:32 AM

## 2023-05-18 NOTE — Progress Notes (Signed)
RT went to check on pt for 0400 rounds pt is currently off bipap and on [place]. RN aware.

## 2023-05-18 NOTE — Progress Notes (Signed)
PT Cancellation Note  Patient Details Name: [name] MRN: [id number] DOB: 1965-12-25   Cancelled Treatment:    Reason Eval/Treat Not Completed: Patient declined, reports she is too fatigued.   Angelina Ok [facility] 05/18/2023, 6:32 PM Skip Mayer PT Acute [employer] [phone]

## 2023-05-19 ENCOUNTER — Inpatient Hospital Stay (HOSPITAL_COMMUNITY): Payer: BC Managed Care – PPO

## 2023-05-19 DIAGNOSIS — I232 Ventricular septal defect as current complication following acute myocardial infarction: Secondary | ICD-10-CM | POA: Diagnosis not present

## 2023-05-19 DIAGNOSIS — I2102 ST elevation (STEMI) myocardial infarction involving left anterior descending coronary artery: Secondary | ICD-10-CM | POA: Diagnosis not present

## 2023-05-19 LAB — GLUCOSE, CAPILLARY
Glucose-Capillary: 98 mg/dL (ref 70–99)
Glucose-Capillary: 93 mg/dL (ref 70–99)
Glucose-Capillary: 110 mg/dL — ABNORMAL HIGH (ref 70–99)
Glucose-Capillary: 125 mg/dL — ABNORMAL HIGH (ref 70–99)
Glucose-Capillary: 120 mg/dL — ABNORMAL HIGH (ref 70–99)

## 2023-05-19 MED ORDER — METOLAZONE 5 MG PO TABS
5.0000 mg | ORAL_TABLET | Freq: Every day | ORAL | Status: DC
  Administered 2023-05-19 – 2023-05-20 (×2): 5 mg via ORAL
  Filled 2023-05-19 (×2): qty 1

## 2023-05-19 MED ORDER — ORAL CARE MOUTH RINSE: 15.0000 mL | OROMUCOSAL | Status: DC | PRN

## 2023-05-19 MED ORDER — POTASSIUM CHLORIDE CRYS ER 20 MEQ PO TBCR
20.0000 meq | EXTENDED_RELEASE_TABLET | ORAL | Status: AC
  Administered 2023-05-19 (×3): 20 meq via ORAL
  Filled 2023-05-19 (×3): qty 1

## 2023-05-19 NOTE — Progress Notes (Signed)
Inpatient Rehab Admissions Coordinator:  Pt not medically ready for potential CIR admission. Will continue to follow.   Gayland Curry, [place], [place] Admissions Coordinator [phone]

## 2023-05-19 NOTE — Progress Notes (Signed)
NAME:  [name], MRN:  [id number], DOB:  [birth year], LOS: 15 ADMISSION DATE:  05/04/2023, CONSULTATION DATE: 05/11/2023 REFERRING MD: Dr. Cliffton Asters, CHIEF COMPLAINT: Chest pain  History of Present Illness:  57 year old female with a diabetes melitis, complicated with neuropathy and Wegener's granulomatosis who was initially admitted with acute anterior STEMI underwent PCI to LAD, who continues to require vasopressor support and IABP, echocardiogram showed mechanical complication including apical VSD, underwent VSD repair, remained intubated, on multiple vasopressor support, PCCM was consulted for help evaluation medical management  Pertinent  Medical History   Past Medical History:  Diagnosis Date   Chronic cough    Chronic pain    Diabetes mellitus without complication (HCC)    Dyspnea    GERD (gastroesophageal reflux disease)    Hypokalemia    Hypothyroidism    Left wrist pain 06/01/2021   Myonecrosis (HCC) 07/01/2021   Neurocardiogenic syncope    OSA (obstructive sleep apnea)    does not use CPAP   Osteomyelitis (HCC)    bilateral feet   Peripheral neuropathy    Presence of intrathecal pump    recieves Prialt/bupivicaine   Tear of gluteus medius tendon 07/01/2021   Wegener's granulomatosis 2009   Wegner's disease (congenital syphilitic osteochondritis)      Significant Hospital Events: Including procedures, antibiotic start and stop dates in addition to other pertinent events     Interim History / Subjective:  No overnight issues Remained afebrile Milrinone was stopped Coox is 64% Remained generalized weak  Objective   Blood pressure 104/66, pulse 63, temperature 97.9 F (36.6 C), temperature source Oral, resp. rate 14, height 5\' 4"  (1.626 m), weight 104.6 kg, SpO2 100%.    FiO2 (%):  [50 %] 50 % PEEP:  [6 cmH20] 6 cmH20   Intake/Output Summary (Last 24 hours) at 05/19/2023 0830 Last data filed at 05/19/2023 0800 Gross per 24 hour  Intake --  Output 2345 ml   Net -2345 ml   Filed Weights   05/16/23 0451 05/17/23 1700 05/18/23 0500  Weight: 103.9 kg 102.6 kg 104.6 kg    Examination: General: Chronically ill-appearing morbidly obese female, lying on the bed HEENT: [place]/AT, eyes anicteric.  moist mucus membranes Neuro: Alert, awake following commands Chest: Coarse breath sounds, no wheezes or rhonchi Heart: Regular rate and rhythm, no murmurs or gallops Abdomen: Soft, nontender, nondistended, bowel sounds present Skin: No rash  Labs and images were reviewed Coox 64%, Na 131, K 3.6, Cr 1.36 Serum bicarbonate 25 Hb 7.8, PLT 485  Urine output 1.7 L in last 24 hours with net -1.6L  Resolved Hospital Problem list   Thrombocytopenia  Assessment & Plan:  Coronary artery disease, presented with acute anterior STEMI, complicated with apical VSD. Apical VSD s/p surgical repair S/p PCI to LAD with DES  Continue Plavix and apixaban Continue statin Continue pain control with tramadol, oxycodone and Dilaudid Encourage incentive spirometry and ambulation  Acute HFrEF with cardiogenic shock IABP was discontinued on 7/20 Patient ejection fraction is 45% Levophed and milrinone off now Advanced heart failure team is following Continue torsemide 40 mg daily and spironolactone  Acute respiratory failure with hypoxia/hypercapnia, due to pulmonary edema Mucous plugging Patient to be done 3 to 4 L oxygen Titrate with O2 sat goal 92% Encourage incentive spirometry and ambulation  Hyperlipidemia Continue Crestor  Acute kidney injury due to ischemic ATN Hypervolemic hyponatremia Hypokalemia, corrected Serum creatinine stable around 1.2-1.3 now Avoid nephrotoxic agent Closely monitor electrolytes Serum sodium remained at 131 Serum K  remain 3.9 Continue aggressive electrolyte replacement  Metabolic alkalosis Serum bicarbonate trended down to 25 Acid dolomite is off now  Diabetes type 2 Patient hemoglobin A1c is 7.6 Continue sliding  scale insulin and Semglee  Expected perioperative blood loss anemia H&H and platelet counts are stable now S/p1 unit PRBC few days ago Monitor H&H and platelet count  Morbid obesity OSA Diet and exercise counseling provided BiPAP overnight  Best Practice (right click and "Reselect all SmartList Selections" daily)   Diet/type: Regular consistency DVT prophylaxis: Apixaban GI prophylaxis: PPI Lines: Central line, yes still needed  Foley:  Yes, and it is still needed Code Status:  full code Last date of multidisciplinary goals of care discussion [Per primary team]  Labs   CBC: Recent Labs  Lab 05/13/23 0908 05/13/23 2259 05/15/23 0427 05/16/23 0414 05/17/23 0355 05/18/23 0340 05/19/23 0425  WBC 18.0*   < > 13.1* 11.3* 11.7* 9.8 12.2*  NEUTROABS 12.9*  --   --   --   --   --   --   HGB 7.3*   < > 8.0* 8.1* 7.8* 7.7* 7.8*  HCT 23.4*   < > 24.5* 25.8* 25.9* 24.9* 25.3*  MCV 90.0   < > 88.4 90.8 90.9 92.6 92.7  PLT 105*   < > 226 299 358 412* 485*   < > = values in this interval not displayed.    Basic Metabolic Panel: Recent Labs  Lab 05/15/23 0427 05/16/23 0414 05/16/23 1847 05/17/23 0355 05/17/23 0948 05/17/23 1740 05/18/23 0340 05/19/23 0425  NA 133* 135   < > 130* 132* 129* 130* 131*  K 3.0* 2.9*   < > 3.0* 3.1* 3.8 3.9 3.6  CL 87* 87*   < > 88* 87* 92* 94* 95*  CO2 35* 39*   < > 32 31 27 27 25   GLUCOSE 197* 142*   < > 146* 166* 207* 125* 106*  BUN 18 17   < > 15 14 17 18 20   CREATININE 1.55* 1.42*   < > 1.31* 1.22* 1.28* 1.34* 1.36*  CALCIUM 8.0* 8.0*   < > 8.0* 8.5* 8.1* 8.3* 8.0*  MG 1.9 2.0  --  1.9  --   --  2.4 2.2   < > = values in this interval not displayed.   GFR: Estimated Creatinine Clearance: 53.8 mL/min (A) (by C-G formula based on SCr of 1.36 mg/dL (H)). Recent Labs  Lab 05/16/23 0414 05/17/23 0355 05/18/23 0340 05/19/23 0425  WBC 11.3* 11.7* 9.8 12.2*    Liver Function Tests: No results for input(s): "AST", "ALT", "ALKPHOS",  "BILITOT", "PROT", "ALBUMIN" in the last 168 hours.  No results for input(s): "LIPASE", "AMYLASE" in the last 168 hours. No results for input(s): "AMMONIA" in the last 168 hours.  ABG    Component Value Date/Time   PHART 7.417 05/14/2023 0955   PCO2ART 49.4 (H) 05/14/2023 0955   PO2ART 116 (H) 05/14/2023 0955   HCO3 32.0 (H) 05/14/2023 0955   TCO2 33 (H) 05/14/2023 0955   ACIDBASEDEF 3.0 (H) 05/13/2023 2259   O2SAT 63.9 05/19/2023 0425     Coagulation Profile: No results for input(s): "INR", "PROTIME" in the last 168 hours.   Cardiac Enzymes: No results for input(s): "CKTOTAL", "CKMB", "CKMBINDEX", "TROPONINI" in the last 168 hours.  HbA1C: HbA1c, POC (controlled diabetic range)  Date/Time Value Ref Range Status  04/03/2023 05:04 PM 7.5 (A) 0.0 - 7.0 % Final   Hgb A1c MFr Bld  Date/Time Value Ref  Range Status  05/04/2023 06:20 AM 7.6 (H) 4.8 - 5.6 % Final    Comment:    (NOTE)         Prediabetes: 5.7 - 6.4         Diabetes: >6.4         Glycemic control for adults with diabetes: <7.0   01/04/2023 01:12 PM 7.6 (H) 4.8 - 5.6 % Final    Comment:             Prediabetes: 5.7 - 6.4          Diabetes: >6.4          Glycemic control for adults with diabetes: <7.0     CBG: Recent Labs  Lab 05/18/23 1808 05/18/23 2144 05/18/23 2147 05/19/23 0432 05/19/23 0616  GLUCAP 134* 86 89 98 93      Cheri Fowler, MD [facility] See Amion for pager If no response to pager, please call [phone] until 7pm After 7pm, Please call E-link [phone]

## 2023-05-19 NOTE — Progress Notes (Signed)
CT Surgery PM Note  Walked in room Excellent diuresis NSR, lungs clearing Will hold on Bipap this pm  Blood pressure 109/76, pulse 69, temperature 98.5 F (36.9 C), temperature source Oral, resp. rate 14, height 5\' 4"  (1.626 m), weight 104.6 kg, SpO2 100%  Total I/O In: 660 [P.O.:660] Out: 2000 [Urine:1250; Stool:750] .

## 2023-05-19 NOTE — Plan of Care (Signed)
  Problem: Education: Goal: Knowledge of General Education information will improve Description: Including pain rating scale, medication(s)/side effects and non-pharmacologic comfort measures Outcome: Progressing   Problem: Health Behavior/Discharge Planning: Goal: Ability to manage health-related needs will improve Outcome: Progressing   Problem: Clinical Measurements: Goal: Ability to maintain clinical measurements within normal limits will improve Outcome: Progressing Goal: Will remain free from infection Outcome: Progressing Goal: Diagnostic test results will improve Outcome: Progressing Goal: Respiratory complications will improve Outcome: Progressing Goal: Cardiovascular complication will be avoided Outcome: Progressing   Problem: Activity: Goal: Risk for activity intolerance will decrease Outcome: Progressing   Problem: Nutrition: Goal: Adequate nutrition will be maintained Outcome: Progressing   Problem: Coping: Goal: Level of anxiety will decrease Outcome: Progressing   Problem: Elimination: Goal: Will not experience complications related to bowel motility Outcome: Progressing Goal: Will not experience complications related to urinary retention Outcome: Progressing   Problem: Pain Managment: Goal: General experience of comfort will improve Outcome: Progressing   Problem: Safety: Goal: Ability to remain free from injury will improve Outcome: Progressing   Problem: Skin Integrity: Goal: Risk for impaired skin integrity will decrease Outcome: Progressing   Problem: Education: Goal: Understanding of CV disease, CV risk reduction, and recovery process will improve Outcome: Progressing Goal: Individualized Educational Video(s) Outcome: Progressing   Problem: Activity: Goal: Ability to return to baseline activity level will improve Outcome: Progressing   Problem: Cardiovascular: Goal: Ability to achieve and maintain adequate cardiovascular perfusion  will improve Outcome: Progressing Goal: Vascular access site(s) Level 0-1 will be maintained Outcome: Progressing   Problem: Health Behavior/Discharge Planning: Goal: Ability to safely manage health-related needs after discharge will improve Outcome: Progressing   Problem: Education: Goal: Ability to describe self-care measures that may prevent or decrease complications (Diabetes Survival Skills Education) will improve Outcome: Progressing Goal: Individualized Educational Video(s) Outcome: Progressing   Problem: Coping: Goal: Ability to adjust to condition or change in health will improve Outcome: Progressing   Problem: Fluid Volume: Goal: Ability to maintain a balanced intake and output will improve Outcome: Progressing   Problem: Health Behavior/Discharge Planning: Goal: Ability to identify and utilize available resources and services will improve Outcome: Progressing Goal: Ability to manage health-related needs will improve Outcome: Progressing   Problem: Metabolic: Goal: Ability to maintain appropriate glucose levels will improve Outcome: Progressing   Problem: Nutritional: Goal: Maintenance of adequate nutrition will improve Outcome: Progressing Goal: Progress toward achieving an optimal weight will improve Outcome: Progressing   Problem: Skin Integrity: Goal: Risk for impaired skin integrity will decrease Outcome: Progressing   Problem: Tissue Perfusion: Goal: Adequacy of tissue perfusion will improve Outcome: Progressing   Problem: Education: Goal: Will demonstrate proper wound care and an understanding of methods to prevent future damage Outcome: Progressing Goal: Knowledge of disease or condition will improve Outcome: Progressing Goal: Knowledge of the prescribed therapeutic regimen will improve Outcome: Progressing Goal: Individualized Educational Video(s) Outcome: Progressing   Problem: Activity: Goal: Risk for activity intolerance will  decrease Outcome: Progressing   Problem: Cardiac: Goal: Will achieve and/or maintain hemodynamic stability Outcome: Progressing   Problem: Clinical Measurements: Goal: Postoperative complications will be avoided or minimized Outcome: Progressing   Problem: Respiratory: Goal: Respiratory status will improve Outcome: Progressing   Problem: Skin Integrity: Goal: Wound healing without signs and symptoms of infection Outcome: Progressing Goal: Risk for impaired skin integrity will decrease Outcome: Progressing   Problem: Urinary Elimination: Goal: Ability to achieve and maintain adequate renal perfusion and functioning will improve Outcome: Progressing

## 2023-05-19 NOTE — Final Progress Note (Addendum)
8 Days Post-Op Procedure(s) (LRB): VENTRICULAR SEPTAL DEFECT (VSD) REPAIR (N/A) TRANSESOPHAGEAL ECHOCARDIOGRAM (N/A) Subjective: Patient still having surgical pain with coughing Sternal Moderma is removed and incision clean and dry Chest x-ray with persistent atelectasis/effusion on the left side.  One of the lower sternal wires appears to be displaced to the right side of the sternum Still requiring high flow oxygen, diuretic doses have been increased Maintaining sinus rhythm Still very weak and has not been able to walk outside of the room with physical therapy.  Not ready for stepdown Objective: Vital signs in last 24 hours: Temp:  [97.9 F (36.6 C)-98.7 F (37.1 C)] 98 F (36.7 C) (07/27 0800) Pulse Rate:  [58-149] 149 (07/27 0900) Cardiac Rhythm: Normal sinus rhythm (07/27 0805) Resp:  [14-23] 15 (07/27 1100) BP: (81-139)/(55-109) 102/67 (07/27 1100) SpO2:  [89 %-100 %] 89 % (07/27 0900) FiO2 (%):  [50 %] 50 % (07/26 2349)  Hemodynamic parameters for last 24 hours:    Intake/Output from previous day: 07/26 0701 - 07/27 0700 In: 59.2 [I.V.:59.2] Out: 1745 [Urine:1745] Intake/Output this shift: Total I/O In: -  Out: 600 [Urine:600]       Exam    General- alert and comfortable    Neck- no JVD, no cervical adenopathy palpable, no carotid bruit   Lungs- clear without rales, wheezes   Cor- regular rate and rhythm, no murmur , gallop   Abdomen- soft, non-tender   Extremities - warm, non-tender, minimal edema   Neuro- oriented, appropriate, no focal weakness   Lab Results: Recent Labs    05/18/23 0340 05/19/23 0425  WBC 9.8 12.2*  HGB 7.7* 7.8*  HCT 24.9* 25.3*  PLT 412* 485*   BMET:  Recent Labs    05/18/23 0340 05/19/23 0425  NA 130* 131*  K 3.9 3.6  CL 94* 95*  CO2 27 25  GLUCOSE 125* 106*  BUN 18 20  CREATININE 1.34* 1.36*  CALCIUM 8.3* 8.0*    PT/INR: No results for input(s): "LABPROT", "INR" in the last 72 hours. ABG    Component Value  Date/Time   PHART 7.417 05/14/2023 0955   HCO3 32.0 (H) 05/14/2023 0955   TCO2 33 (H) 05/14/2023 0955   ACIDBASEDEF 3.0 (H) 05/13/2023 2259   O2SAT 63.9 05/19/2023 0425   CBG (last 3)  Recent Labs    05/19/23 0432 05/19/23 0616 05/19/23 1040  GLUCAP 98 93 110*    Assessment/Plan: S/P Procedure(s) (LRB): VENTRICULAR SEPTAL DEFECT (VSD) REPAIR (N/A) TRANSESOPHAGEAL ECHOCARDIOGRAM (N/A) Mobilize Diuresis She remains quite weak as she was immobilized in bed with a balloon pump for 7 days prior to surgery. Continue physical therapy support LV function appears to be improved with coox 70%  LOS: 15 days    Lovett Sox 05/19/2023

## 2023-05-20 ENCOUNTER — Inpatient Hospital Stay (HOSPITAL_COMMUNITY): Payer: BC Managed Care – PPO

## 2023-05-20 DIAGNOSIS — I2102 ST elevation (STEMI) myocardial infarction involving left anterior descending coronary artery: Secondary | ICD-10-CM | POA: Diagnosis not present

## 2023-05-20 DIAGNOSIS — M79661 Pain in right lower leg: Secondary | ICD-10-CM

## 2023-05-20 DIAGNOSIS — I232 Ventricular septal defect as current complication following acute myocardial infarction: Secondary | ICD-10-CM | POA: Diagnosis not present

## 2023-05-20 LAB — CBC
HCT: 27 % — ABNORMAL LOW (ref 36.0–46.0)
Hemoglobin: 8.4 g/dL — ABNORMAL LOW (ref 12.0–15.0)
MCH: 27.3 pg (ref 26.0–34.0)
MCHC: 31.1 g/dL (ref 30.0–36.0)
MCV: 87.7 fL (ref 80.0–100.0)
Platelets: 579 10*3/uL — ABNORMAL HIGH (ref 150–400)
RBC: 3.08 MIL/uL — ABNORMAL LOW (ref 3.87–5.11)
RDW: 15.1 % (ref 11.5–15.5)
WBC: 12.9 10*3/uL — ABNORMAL HIGH (ref 4.0–10.5)
nRBC: 0 % (ref 0.0–0.2)

## 2023-05-20 LAB — GLUCOSE, CAPILLARY
Glucose-Capillary: 112 mg/dL — ABNORMAL HIGH (ref 70–99)
Glucose-Capillary: 172 mg/dL — ABNORMAL HIGH (ref 70–99)
Glucose-Capillary: 139 mg/dL — ABNORMAL HIGH (ref 70–99)

## 2023-05-20 LAB — BASIC METABOLIC PANEL WITH GFR
Anion gap: 16 — ABNORMAL HIGH (ref 5–15)
BUN: 20 mg/dL (ref 6–20)
CO2: 27 mmol/L (ref 22–32)
Calcium: 8.3 mg/dL — ABNORMAL LOW (ref 8.9–10.3)
Chloride: 88 mmol/L — ABNORMAL LOW (ref 98–111)
Creatinine, Ser: 1.41 mg/dL — ABNORMAL HIGH (ref 0.44–1.00)
GFR, Estimated: 44 mL/min — ABNORMAL LOW (ref >60)
Glucose, Bld: 110 mg/dL — ABNORMAL HIGH (ref 70–99)
Potassium: 2.6 mmol/L — CL (ref 3.5–5.1)
Sodium: 131 mmol/L — ABNORMAL LOW (ref 135–145)

## 2023-05-20 LAB — MAGNESIUM: Magnesium: 1.9 mg/dL (ref 1.7–2.4)

## 2023-05-20 LAB — COOXEMETRY PANEL
Carboxyhemoglobin: 1.8 % — ABNORMAL HIGH (ref 0.5–1.5)
Methemoglobin: <0.7 % (ref 0.0–1.5)
O2 Saturation: 57.3 %
Total hemoglobin: 9 g/dL — ABNORMAL LOW (ref 12.0–16.0)

## 2023-05-20 LAB — POTASSIUM: Potassium: 2.7 mmol/L — CL (ref 3.5–5.1)

## 2023-05-20 MED ORDER — POTASSIUM CHLORIDE 10 MEQ/50ML IV SOLN
10.0000 meq | INTRAVENOUS | Status: AC
  Administered 2023-05-20: 10 meq via INTRAVENOUS
  Filled 2023-05-20: qty 50

## 2023-05-20 MED ORDER — POTASSIUM CHLORIDE CRYS ER 20 MEQ PO TBCR
40.0000 meq | EXTENDED_RELEASE_TABLET | ORAL | Status: AC
  Administered 2023-05-20 (×2): 40 meq via ORAL
  Filled 2023-05-20 (×2): qty 2

## 2023-05-20 MED ORDER — ALTEPLASE 2 MG IJ SOLR
2.0000 mg | Freq: Once | INTRAMUSCULAR | Status: AC
  Administered 2023-05-20: 2 mg
  Filled 2023-05-20: qty 2

## 2023-05-20 MED ORDER — POTASSIUM CHLORIDE 10 MEQ/50ML IV SOLN
10.0000 meq | INTRAVENOUS | Status: AC
  Administered 2023-05-20 (×3): 10 meq via INTRAVENOUS
  Filled 2023-05-20 (×3): qty 50

## 2023-05-20 MED ORDER — APIXABAN 5 MG PO TABS
5.0000 mg | ORAL_TABLET | Freq: Two times a day (BID) | ORAL | Status: DC
  Administered 2023-05-21 – 2023-05-24 (×7): 5 mg via ORAL
  Filled 2023-05-20 (×7): qty 1

## 2023-05-20 MED ORDER — SODIUM CHLORIDE 0.9% FLUSH
10.0000 mL | Freq: Two times a day (BID) | INTRAVENOUS | Status: DC
  Administered 2023-05-20 – 2023-05-25 (×11): 10 mL
  Administered 2023-05-25: 20 mL
  Administered 2023-05-26 – 2023-05-28 (×3): 10 mL
  Administered 2023-05-28: 30 mL
  Administered 2023-05-29: 20 mL

## 2023-05-20 MED ORDER — ORAL CARE MOUTH RINSE: 15.0000 mL | OROMUCOSAL | Status: DC | PRN

## 2023-05-20 MED ORDER — SODIUM CHLORIDE 0.9% FLUSH: 10.0000 mL | INTRAVENOUS | Status: DC | PRN

## 2023-05-20 MED ORDER — POTASSIUM CHLORIDE 10 MEQ/50ML IV SOLN
10.0000 meq | Freq: Once | INTRAVENOUS | Status: AC
  Administered 2023-05-20: 10 meq via INTRAVENOUS
  Filled 2023-05-20: qty 50

## 2023-05-20 MED ORDER — POTASSIUM CHLORIDE CRYS ER 20 MEQ PO TBCR
20.0000 meq | EXTENDED_RELEASE_TABLET | ORAL | Status: DC
  Administered 2023-05-20: 20 meq via ORAL
  Filled 2023-05-20: qty 1

## 2023-05-20 MED ORDER — ALTEPLASE 2 MG IJ SOLR: 2.0000 mg | Freq: Once | INTRAMUSCULAR | Status: DC

## 2023-05-20 MED ORDER — MAGNESIUM SULFATE 2 GM/50ML IV SOLN
2.0000 g | Freq: Once | INTRAVENOUS | Status: AC
  Administered 2023-05-20: 2 g via INTRAVENOUS
  Filled 2023-05-20: qty 50

## 2023-05-20 NOTE — Progress Notes (Signed)
VASCULAR LAB    Right lower extremity venous duplex has been performed.  See CV proc for preliminary results.   Napolean Sia, RVT 05/20/2023, 3:22 PM

## 2023-05-20 NOTE — Progress Notes (Signed)
CT Surgery PM Note  CT abd/.pelvis without pelvic hematoma Venous duplex of legs no DVT Good pulses of LE's Patient needs more PT to help with leg weakness from extended bedrest with post MI preop IABP Patient reassured Hi flow O2 now off Plan transfer to 4E tomorrow  Blood pressure 115/71, pulse 68, temperature (P) 98 F (36.7 C), resp. rate 18, height 5\' 4"  (1.626 m), weight 98.6 kg, SpO2 97%.   Total I/O In: 252.7 [I.V.:2.5; IV Piggyback:250.2] Out: 2000 [Urine:2000]

## 2023-05-20 NOTE — Progress Notes (Addendum)
NAME:  [name], MRN:  [id number], DOB:  1966-03-12, LOS: 16 ADMISSION DATE:  05/04/2023, CONSULTATION DATE: 05/11/2023 REFERRING MD: Dr. Cliffton Asters, CHIEF COMPLAINT: Chest pain  History of Present Illness:  57 year old female with a diabetes melitis, complicated with neuropathy and Wegener's granulomatosis who was initially admitted with acute anterior STEMI underwent PCI to LAD, who continues to require vasopressor support and IABP, echocardiogram showed mechanical complication including apical VSD, underwent VSD repair, remained intubated, on multiple vasopressor support, PCCM was consulted for help evaluation medical management  Pertinent  Medical History   Past Medical History:  Diagnosis Date   Chronic cough    Chronic pain    Diabetes mellitus without complication (HCC)    Dyspnea    GERD (gastroesophageal reflux disease)    Hypokalemia    Hypothyroidism    Left wrist pain 06/01/2021   Myonecrosis (HCC) 07/01/2021   Neurocardiogenic syncope    OSA (obstructive sleep apnea)    does not use CPAP   Osteomyelitis (HCC)    bilateral feet   Peripheral neuropathy    Presence of intrathecal pump    recieves Prialt/bupivicaine   Tear of gluteus medius tendon 07/01/2021   Wegener's granulomatosis 2009   Wegner's disease (congenital syphilitic osteochondritis)      Significant Hospital Events: Including procedures, antibiotic start and stop dates in addition to other pertinent events     Interim History / Subjective:  No overnight issues Continue to require oxygen 6 to 8 L to maintain O2 sat above 92% during night, during the day oxygen titrated down to 2 to 3 L Complaining of right leg heaviness  Objective   Blood pressure 98/64, pulse 68, temperature 98.6 F (37 C), temperature source Oral, resp. rate 13, height 5\' 4"  (1.626 m), weight 98.6 kg, SpO2 100%.        Intake/Output Summary (Last 24 hours) at 05/20/2023 4098 Last data filed at 05/20/2023 0800 Gross per 24 hour   Intake 1070 ml  Output 3475 ml  Net -2405 ml   Filed Weights   05/17/23 1700 05/18/23 0500 05/20/23 0500  Weight: 102.6 kg 104.6 kg 98.6 kg    Examination: General: Middle-age morbidly obese female, lying on the bed HEENT: [place]/AT, eyes anicteric.  moist mucus membranes Neuro: Alert, awake following commands Chest: Coarse breath sounds, no wheezes or rhonchi Heart: Regular rate and rhythm, no murmurs or gallops Abdomen: Soft, nontender, nondistended, bowel sounds present Skin: No rash  Labs and images were reviewed Coox 58%, Na 131, K 2.6, Cr 1.41 Serum bicarbonate 27 Hb 8.4, PLT 579  Urine output 3 L in last 24 hours with net -2.5L  Resolved Hospital Problem list   Thrombocytopenia  Assessment & Plan:  Coronary artery disease, presented with acute anterior STEMI, complicated with apical VSD. Apical VSD s/p surgical repair S/p PCI to LAD with DES  Continue Plavix  Holding apixaban for now until CT abdomen pelvis done for right groin hematoma Continue statin Continue pain control with tramadol, oxycodone and Dilaudid Encourage incentive spirometry and ambulation  Right leg heaviness CT abdomen pelvis pending to rule out pelvic hematoma  Acute HFrEF with cardiogenic shock S/p IABP removal on 7/20 Patient ejection fraction is 45% Shock is resolved, pressors and inotropes are off Advanced heart failure team is following Continue torsemide 40 mg daily and spironolactone  Acute respiratory failure with hypoxia/hypercapnia, due to pulmonary edema Mucous plugging Patient to be done 3 to 4 L oxygen during daytime and 6 to 8 L during  night Titrate with O2 sat goal 92% Encourage incentive spirometry and ambulation  Hyperlipidemia Continue Crestor  Acute kidney injury due to ischemic ATN Hypervolemic hyponatremia Hypokalemia Serum creatinine stable around 1.2-1.4 Avoid nephrotoxic agent Closely monitor electrolytes Serum sodium remained at 131 Serum K remain  5.6 Continue aggressive electrolyte replacement  Metabolic alkalosis Serum bicarbonate trended down to 27 Acetazolamide is off  Diabetes type 2 Patient hemoglobin A1c is 7.6 Continue sliding scale insulin and Semglee  Expected perioperative blood loss anemia H&H and platelet counts are stable now S/p1 unit PRBC few days ago Monitor H&H and platelet count  Morbid obesity OSA Diet and exercise counseling provided BiPAP overnight  Best Practice (right click and "Reselect all SmartList Selections" daily)   Diet/type: Regular consistency DVT prophylaxis: Apixaban GI prophylaxis: PPI Lines: Central line, yes still needed  Foley:  Yes, and it is still needed Code Status:  full code Last date of multidisciplinary goals of care discussion [Per primary team]  Labs   CBC: Recent Labs  Lab 05/13/23 0908 05/13/23 2259 05/16/23 0414 05/17/23 0355 05/18/23 0340 05/19/23 0425 05/20/23 0343  WBC 18.0*   < > 11.3* 11.7* 9.8 12.2* 12.9*  NEUTROABS 12.9*  --   --   --   --   --   --   HGB 7.3*   < > 8.1* 7.8* 7.7* 7.8* 8.4*  HCT 23.4*   < > 25.8* 25.9* 24.9* 25.3* 27.0*  MCV 90.0   < > 90.8 90.9 92.6 92.7 87.7  PLT 105*   < > 299 358 412* 485* 579*   < > = values in this interval not displayed.    Basic Metabolic Panel: Recent Labs  Lab 05/16/23 0414 05/16/23 1847 05/17/23 0355 05/17/23 0948 05/17/23 1740 05/18/23 0340 05/19/23 0425 05/20/23 0343  NA 135   < > 130* 132* 129* 130* 131* 131*  K 2.9*   < > 3.0* 3.1* 3.8 3.9 3.6 2.6*  CL 87*   < > 88* 87* 92* 94* 95* 88*  CO2 39*   < > 32 31 27 27 25 27   GLUCOSE 142*   < > 146* 166* 207* 125* 106* 110*  BUN 17   < > 15 14 17 18 20 20   CREATININE 1.42*   < > 1.31* 1.22* 1.28* 1.34* 1.36* 1.41*  CALCIUM 8.0*   < > 8.0* 8.5* 8.1* 8.3* 8.0* 8.3*  MG 2.0  --  1.9  --   --  2.4 2.2 1.9   < > = values in this interval not displayed.   GFR: Estimated Creatinine Clearance: 50.2 mL/min (A) (by C-G formula based on SCr of 1.41  mg/dL (H)). Recent Labs  Lab 05/17/23 0355 05/18/23 0340 05/19/23 0425 05/20/23 0343  WBC 11.7* 9.8 12.2* 12.9*    Liver Function Tests: No results for input(s): "AST", "ALT", "ALKPHOS", "BILITOT", "PROT", "ALBUMIN" in the last 168 hours.  No results for input(s): "LIPASE", "AMYLASE" in the last 168 hours. No results for input(s): "AMMONIA" in the last 168 hours.  ABG    Component Value Date/Time   PHART 7.417 05/14/2023 0955   PCO2ART 49.4 (H) 05/14/2023 0955   PO2ART 116 (H) 05/14/2023 0955   HCO3 32.0 (H) 05/14/2023 0955   TCO2 33 (H) 05/14/2023 0955   ACIDBASEDEF 3.0 (H) 05/13/2023 2259   O2SAT 57.3 05/20/2023 0343     Coagulation Profile: No results for input(s): "INR", "PROTIME" in the last 168 hours.   Cardiac Enzymes: No results for input(s): "  CKTOTAL", "CKMB", "CKMBINDEX", "TROPONINI" in the last 168 hours.  HbA1C: HbA1c, POC (controlled diabetic range)  Date/Time Value Ref Range Status  04/03/2023 05:04 PM 7.5 (A) 0.0 - 7.0 % Final   Hgb A1c MFr Bld  Date/Time Value Ref Range Status  05/04/2023 06:20 AM 7.6 (H) 4.8 - 5.6 % Final    Comment:    (NOTE)         Prediabetes: 5.7 - 6.4         Diabetes: >6.4         Glycemic control for adults with diabetes: <7.0   01/04/2023 01:12 PM 7.6 (H) 4.8 - 5.6 % Final    Comment:             Prediabetes: 5.7 - 6.4          Diabetes: >6.4          Glycemic control for adults with diabetes: <7.0     CBG: Recent Labs  Lab 05/19/23 0616 05/19/23 1040 05/19/23 1546 05/19/23 2143 05/20/23 0653  GLUCAP 93 110* 125* 120* 112*      Cheri Fowler, MD [facility] See Amion for pager If no response to pager, please call [phone] until 7pm After 7pm, Please call E-link [phone]

## 2023-05-20 NOTE — Progress Notes (Signed)
9 Days Post-Op Procedure(s) (LRB): VENTRICULAR SEPTAL DEFECT (VSD) REPAIR (N/A) TRANSESOPHAGEAL ECHOCARDIOGRAM (N/A) Subjective: Patient slept well without BiPAP High flow oxygen now down to 8 L with excellent diuresis yesterday and 3 kg decrease in weight Maintaining sinus rhythm and improving mobility  Patient complains of weakness lifting right leg which has been slowly getting worse over the past couple days.  She is able to walk with assistance.  She did have a balloon pump in the right groin and may have a pelvic hematoma with femoral nerve stretch.  Will check CT of the abdomen and pelvis without contrast since her baseline creatinine is elevated  Objective: Vital signs in last 24 hours: Temp:  [98.2 F (36.8 C)-98.6 F (37 C)] 98.6 F (37 C) (07/28 0650) Pulse Rate:  [62-143] 70 (07/28 1027) Cardiac Rhythm: Normal sinus rhythm (07/28 0800) Resp:  [12-23] 14 (07/28 1027) BP: (60-147)/(15-128) 99/58 (07/28 1027) SpO2:  [91 %-100 %] 97 % (07/28 1027) Weight:  [98.6 kg] 98.6 kg (07/28 0500)  Hemodynamic parameters for last 24 hours:  Stable blood pressure and heart rate  Intake/Output from previous day: 07/27 0701 - 07/28 0700 In: 1310 [P.O.:1310] Out: 3825 [Urine:3025; Stool:800] Intake/Output this shift: Total I/O In: 1.8 [I.V.:0; IV Piggyback:1.8] Out: 250 [Urine:250]  Exam Alert and comfortable Moderate weakness right quadriceps lifting the right leg Lungs clear Sinus rhythm without murmur Chest incision healing well Lab Results: Recent Labs    05/19/23 0425 05/20/23 0343  WBC 12.2* 12.9*  HGB 7.8* 8.4*  HCT 25.3* 27.0*  PLT 485* 579*   BMET:  Recent Labs    05/19/23 0425 05/20/23 0343  NA 131* 131*  K 3.6 2.6*  CL 95* 88*  CO2 25 27  GLUCOSE 106* 110*  BUN 20 20  CREATININE 1.36* 1.41*  CALCIUM 8.0* 8.3*    PT/INR: No results for input(s): "LABPROT", "INR" in the last 72 hours. ABG    Component Value Date/Time   PHART 7.417 05/14/2023 0955    HCO3 32.0 (H) 05/14/2023 0955   TCO2 33 (H) 05/14/2023 0955   ACIDBASEDEF 3.0 (H) 05/13/2023 2259   O2SAT 57.3 05/20/2023 0343   CBG (last 3)  Recent Labs    05/19/23 1546 05/19/23 2143 05/20/23 0653  GLUCAP 125* 120* 112*    Assessment/Plan: S/P Procedure(s) (LRB): VENTRICULAR SEPTAL DEFECT (VSD) REPAIR (N/A) TRANSESOPHAGEAL ECHOCARDIOGRAM (N/A) Mobilize Diuresis Wean high flow oxygen as tolerated Check CT of abdomen pelvis for right pelvic hematoma and some right quadricep weakness Hold Eliquis for now but continue Plavix for the LAD PCI.  LOS: 16 days    Lovett Sox 05/20/2023

## 2023-05-20 NOTE — Plan of Care (Signed)
Problem: Education: Goal: Knowledge of General Education information will improve Description: Including pain rating scale, medication(s)/side effects and non-pharmacologic comfort measures 05/20/2023 1718 by Jolanda Mccann, [place] G, RN Outcome: Progressing 05/20/2023 1718 by Maveryk Renstrom, [place] G, RN Outcome: Progressing   Problem: Health Behavior/Discharge Planning: Goal: Ability to manage health-related needs will improve 05/20/2023 1718 by Bates Collington, [place] G, RN Outcome: Progressing 05/20/2023 1718 by Amit Leece, [place] G, RN Outcome: Progressing   Problem: Clinical Measurements: Goal: Ability to maintain clinical measurements within normal limits will improve 05/20/2023 1718 by Jacen Carlini, [place] G, RN Outcome: Progressing 05/20/2023 1718 by Reannah Totten, [place] G, RN Outcome: Progressing Goal: Will remain free from infection 05/20/2023 1718 by Masen Luallen, [place], RN Outcome: Progressing 05/20/2023 1718 by Emali Heyward, [place] G, RN Outcome: Progressing Goal: Diagnostic test results will improve 05/20/2023 1718 by Tandrea Kommer, [place] G, RN Outcome: Progressing 05/20/2023 1718 by Kendle Erker, [place] G, RN Outcome: Progressing Goal: Respiratory complications will improve 05/20/2023 1718 by Germain Koopmann, [place] G, RN Outcome: Progressing 05/20/2023 1718 by Tate Zagal, [place] G, RN Outcome: Progressing Goal: Cardiovascular complication will be avoided 05/20/2023 1718 by Abdoulaye Drum, [place] G, RN Outcome: Progressing 05/20/2023 1718 by Hadley Soileau, [place] G, RN Outcome: Progressing   Problem: Activity: Goal: Risk for activity intolerance will decrease 05/20/2023 1718 by Regana Kemple, [place] G, RN Outcome: Progressing 05/20/2023 1718 by Dejanique Ruehl, [place] G, RN Outcome: Progressing   Problem: Nutrition: Goal: Adequate nutrition will be maintained 05/20/2023 1718 by Donalda Job, [place] G, RN Outcome: Progressing 05/20/2023 1718 by Sarahy Creedon, [place] G, RN Outcome: Progressing   Problem: Coping: Goal: Level of anxiety will decrease 05/20/2023 1718 by Tenecia Ignasiak, [place] G, RN Outcome:  Progressing 05/20/2023 1718 by Tryniti Laatsch, [place] G, RN Outcome: Progressing   Problem: Elimination: Goal: Will not experience complications related to bowel motility 05/20/2023 1718 by Bronte Kropf, [place] G, RN Outcome: Progressing 05/20/2023 1718 by Andria Head, [place] G, RN Outcome: Progressing Goal: Will not experience complications related to urinary retention 05/20/2023 1718 by Latorie Montesano, [place] G, RN Outcome: Progressing 05/20/2023 1718 by Ashling Roane, [place] G, RN Outcome: Progressing   Problem: Pain Managment: Goal: General experience of comfort will improve 05/20/2023 1718 by Eyoel Throgmorton, [place] G, RN Outcome: Progressing 05/20/2023 1718 by Jamee Pacholski, [place] G, RN Outcome: Progressing   Problem: Safety: Goal: Ability to remain free from injury will improve 05/20/2023 1718 by Indio Santilli, [place] G, RN Outcome: Progressing 05/20/2023 1718 by Zelie Asbill, [place] G, RN Outcome: Progressing   Problem: Skin Integrity: Goal: Risk for impaired skin integrity will decrease 05/20/2023 1718 by Jahkeem Kurka, [place] G, RN Outcome: Progressing 05/20/2023 1718 by Amery Minasyan, [place] G, RN Outcome: Progressing   Problem: Education: Goal: Understanding of CV disease, CV risk reduction, and recovery process will improve 05/20/2023 1718 by Conlin Brahm, [place] G, RN Outcome: Progressing 05/20/2023 1718 by Annalee Meyerhoff, [place] G, RN Outcome: Progressing Goal: Individualized Educational Video(s) 05/20/2023 1718 by Julion Gatt, [place] G, RN Outcome: Progressing 05/20/2023 1718 by Eryn Marandola, [place] G, RN Outcome: Progressing   Problem: Activity: Goal: Ability to return to baseline activity level will improve 05/20/2023 1718 by Kamarri Lovvorn, [place] G, RN Outcome: Progressing 05/20/2023 1718 by Cyanna Neace, [place] G, RN Outcome: Progressing   Problem: Cardiovascular: Goal: Ability to achieve and maintain adequate cardiovascular perfusion will improve 05/20/2023 1718 by Mekayla Soman, [place] G, RN Outcome: Progressing 05/20/2023 1718 by Dante Cooter, [place] G, RN Outcome: Progressing Goal: Vascular  access site(s) Level 0-1 will be maintained 05/20/2023 1718 by Jibri Schriefer, [place] G, RN Outcome: Progressing 05/20/2023 1718 by Lorilynn Lehr, [place] G, RN Outcome: Progressing   Problem: Health Behavior/Discharge Planning: Goal: Ability to safely manage health-related needs after  discharge will improve 05/20/2023 1718 by Moksh Loomer, [place] G, RN Outcome: Progressing 05/20/2023 1718 by Denzil Mceachron, [place] G, RN Outcome: Progressing   Problem: Education: Goal: Ability to describe self-care measures that may prevent or decrease complications (Diabetes Survival Skills Education) will improve 05/20/2023 1718 by Adalberto Metzgar, [place] G, RN Outcome: Progressing 05/20/2023 1718 by Darlisha Kelm, [place] G, RN Outcome: Progressing Goal: Individualized Educational Video(s) 05/20/2023 1718 by Micahel Omlor, [place] G, RN Outcome: Progressing 05/20/2023 1718 by Trinka Keshishyan, [place] G, RN Outcome: Progressing   Problem: Coping: Goal: Ability to adjust to condition or change in health will improve 05/20/2023 1718 by Alicia Ackert, [place] G, RN Outcome: Progressing 05/20/2023 1718 by Donney Caraveo, [place] G, RN Outcome: Progressing   Problem: Fluid Volume: Goal: Ability to maintain a balanced intake and output will improve 05/20/2023 1718 by Ferris Fielden, [place] G, RN Outcome: Progressing 05/20/2023 1718 by Devun Anna, [place] G, RN Outcome: Progressing   Problem: Health Behavior/Discharge Planning: Goal: Ability to identify and utilize available resources and services will improve 05/20/2023 1718 by Taariq Leitz, [place] G, RN Outcome: Progressing 05/20/2023 1718 by Graylyn Bunney, [place] G, RN Outcome: Progressing Goal: Ability to manage health-related needs will improve 05/20/2023 1718 by Idania Desouza, [place] G, RN Outcome: Progressing 05/20/2023 1718 by Redell Nazir, [place] G, RN Outcome: Progressing   Problem: Metabolic: Goal: Ability to maintain appropriate glucose levels will improve 05/20/2023 1718 by Gerardo Caiazzo, [place] G, RN Outcome: Progressing 05/20/2023 1718 by Winnie Umali, [place] G, RN Outcome:  Progressing   Problem: Nutritional: Goal: Maintenance of adequate nutrition will improve 05/20/2023 1718 by Ranjit Ashurst, [place] G, RN Outcome: Progressing 05/20/2023 1718 by Kendon Sedeno, [place] G, RN Outcome: Progressing Goal: Progress toward achieving an optimal weight will improve 05/20/2023 1718 by Tonnette Zwiebel, [place] G, RN Outcome: Progressing 05/20/2023 1718 by Makinzee Durley, [place] G, RN Outcome: Progressing   Problem: Skin Integrity: Goal: Risk for impaired skin integrity will decrease 05/20/2023 1718 by Anais Koenen, [place] G, RN Outcome: Progressing 05/20/2023 1718 by Kailin Leu, [place] G, RN Outcome: Progressing   Problem: Tissue Perfusion: Goal: Adequacy of tissue perfusion will improve 05/20/2023 1718 by Ariany Kesselman, [place] G, RN Outcome: Progressing 05/20/2023 1718 by Jordanny Waddington, [place] G, RN Outcome: Progressing   Problem: Education: Goal: Will demonstrate proper wound care and an understanding of methods to prevent future damage Outcome: Progressing Goal: Knowledge of disease or condition will improve Outcome: Progressing Goal: Knowledge of the prescribed therapeutic regimen will improve Outcome: Progressing Goal: Individualized Educational Video(s) Outcome: Progressing   Problem: Activity: Goal: Risk for activity intolerance will decrease Outcome: Progressing   Problem: Cardiac: Goal: Will achieve and/or maintain hemodynamic stability Outcome: Progressing   Problem: Clinical Measurements: Goal: Postoperative complications will be avoided or minimized Outcome: Progressing   Problem: Respiratory: Goal: Respiratory status will improve Outcome: Progressing   Problem: Skin Integrity: Goal: Wound healing without signs and symptoms of infection Outcome: Progressing Goal: Risk for impaired skin integrity will decrease Outcome: Progressing   Problem: Urinary Elimination: Goal: Ability to achieve and maintain adequate renal perfusion and functioning will improve Outcome: Progressing

## 2023-05-21 ENCOUNTER — Inpatient Hospital Stay (HOSPITAL_COMMUNITY): Payer: BC Managed Care – PPO

## 2023-05-21 DIAGNOSIS — I2102 ST elevation (STEMI) myocardial infarction involving left anterior descending coronary artery: Secondary | ICD-10-CM | POA: Diagnosis not present

## 2023-05-21 LAB — CBC
HCT: 30.1 % — ABNORMAL LOW (ref 36.0–46.0)
Hemoglobin: 9.3 g/dL — ABNORMAL LOW (ref 12.0–15.0)
MCH: 27 pg (ref 26.0–34.0)
MCHC: 30.9 g/dL (ref 30.0–36.0)
MCV: 87.2 fL (ref 80.0–100.0)
Platelets: 727 10*3/uL — ABNORMAL HIGH (ref 150–400)
RBC: 3.45 MIL/uL — ABNORMAL LOW (ref 3.87–5.11)
RDW: 15.2 % (ref 11.5–15.5)
WBC: 16.3 10*3/uL — ABNORMAL HIGH (ref 4.0–10.5)
nRBC: 0 % (ref 0.0–0.2)

## 2023-05-21 LAB — BASIC METABOLIC PANEL
Anion gap: 18 — ABNORMAL HIGH (ref 5–15)
BUN: 27 mg/dL — ABNORMAL HIGH (ref 6–20)
CO2: 31 mmol/L (ref 22–32)
Calcium: 8.8 mg/dL — ABNORMAL LOW (ref 8.9–10.3)
Chloride: 82 mmol/L — ABNORMAL LOW (ref 98–111)
Creatinine, Ser: 1.7 mg/dL — ABNORMAL HIGH (ref 0.44–1.00)
GFR, Estimated: 35 mL/min — ABNORMAL LOW (ref >60)
Glucose, Bld: 108 mg/dL — ABNORMAL HIGH (ref 70–99)
Potassium: 2.8 mmol/L — ABNORMAL LOW (ref 3.5–5.1)
Sodium: 131 mmol/L — ABNORMAL LOW (ref 135–145)

## 2023-05-21 LAB — GLUCOSE, CAPILLARY
Glucose-Capillary: 181 mg/dL — ABNORMAL HIGH (ref 70–99)
Glucose-Capillary: 161 mg/dL — ABNORMAL HIGH (ref 70–99)
Glucose-Capillary: 150 mg/dL — ABNORMAL HIGH (ref 70–99)
Glucose-Capillary: 182 mg/dL — ABNORMAL HIGH (ref 70–99)

## 2023-05-21 LAB — POTASSIUM: Potassium: 3.3 mmol/L — ABNORMAL LOW (ref 3.5–5.1)

## 2023-05-21 MED ORDER — POTASSIUM CHLORIDE 10 MEQ/50ML IV SOLN
10.0000 meq | INTRAVENOUS | Status: AC
  Administered 2023-05-21 (×3): 10 meq via INTRAVENOUS
  Filled 2023-05-21 (×3): qty 50

## 2023-05-21 MED ORDER — ~~LOC~~ CARDIAC SURGERY, PATIENT & FAMILY EDUCATION: Freq: Once | Status: DC

## 2023-05-21 MED ORDER — ORAL CARE MOUTH RINSE: 15.0000 mL | OROMUCOSAL | Status: DC | PRN

## 2023-05-21 MED ORDER — SODIUM CHLORIDE 0.9 % IV SOLN: 250.0000 mL | INTRAVENOUS | Status: DC | PRN

## 2023-05-21 MED ORDER — POTASSIUM CHLORIDE CRYS ER 20 MEQ PO TBCR
60.0000 meq | EXTENDED_RELEASE_TABLET | Freq: Once | ORAL | Status: AC
  Administered 2023-05-21: 60 meq via ORAL
  Filled 2023-05-21: qty 3

## 2023-05-21 MED ORDER — SODIUM CHLORIDE 0.9% FLUSH: 3.0000 mL | INTRAVENOUS | Status: DC | PRN

## 2023-05-21 MED ORDER — SODIUM CHLORIDE 0.9% FLUSH
3.0000 mL | Freq: Two times a day (BID) | INTRAVENOUS | Status: DC
  Administered 2023-05-21 – 2023-05-22 (×4): 3 mL via INTRAVENOUS

## 2023-05-21 MED ORDER — METOLAZONE 2.5 MG PO TABS
2.5000 mg | ORAL_TABLET | Freq: Every day | ORAL | Status: DC
Start: 2023-05-21 — End: 2023-05-21

## 2023-05-21 MED ORDER — POTASSIUM CHLORIDE 10 MEQ/50ML IV SOLN
10.0000 meq | INTRAVENOUS | Status: AC
  Administered 2023-05-21 – 2023-05-22 (×4): 10 meq via INTRAVENOUS
  Filled 2023-05-21 (×4): qty 50

## 2023-05-21 MED ORDER — SPIRONOLACTONE 25 MG PO TABS
25.0000 mg | ORAL_TABLET | Freq: Every day | ORAL | Status: DC
  Administered 2023-05-21 – 2023-05-29 (×9): 25 mg via ORAL
  Filled 2023-05-21 (×9): qty 1

## 2023-05-21 MED ORDER — POTASSIUM CHLORIDE CRYS ER 20 MEQ PO TBCR
60.0000 meq | EXTENDED_RELEASE_TABLET | ORAL | Status: AC
  Administered 2023-05-21 (×2): 60 meq via ORAL
  Filled 2023-05-21: qty 3

## 2023-05-21 NOTE — Progress Notes (Addendum)
Inpatient Rehab Admissions Coordinator:  Saw pt and family at bedside. Informed that awaiting medical readiness for potential CIR admission. Will continue to follow.   Wolfgang Phoenix, MS, CCC-SLP Admissions Coordinator [phone]

## 2023-05-21 NOTE — Progress Notes (Signed)
Occupational Therapy Treatment Patient Details Name: [name] MRN: [id number] DOB: 1966-10-04 Today's Date: 05/21/2023   History of present illness Pt is 57 year old presented to [facility] on  05/04/23 for STEMI and underwent PCI to LAD on 7/12. Pt with cardiogenic shock and acute heart failure. Pt continued with chest pain and found to have apical ventral septal defect. Pt underwent rt heart cath and IABP placed on 7/14. Underwent surgical ventral septal defect repair on 7/19. IABP removed and pt extubated 7/20. 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   OT comments  Pt progressing towards goals, needing mod A+2 for transfer from [facility] to chair. Pt performing ADLs with up to mod A and min cues to follow sternal precautions when brushing hair. Pt able to completed BUE therex (within limits of sternal precautions). Pt presenting with impairments listed below, will follow acutely. Patient will benefit from intensive inpatient follow up therapy, >3 hours/day to maximize safety/ind with ADLs/functional mobility.    Recommendations for follow up therapy are one component of a multi-disciplinary discharge planning process, led by the attending physician.  Recommendations may be updated based on patient status, additional functional criteria and insurance authorization.    Assistance Recommended at Discharge Intermittent Supervision/Assistance  Patient can return home with the following  Two people to help with walking and/or transfers;A lot of help with bathing/dressing/bathroom;Assistance with cooking/housework;Assist for transportation;Help with stairs or ramp for entrance   Equipment Recommendations  Other (comment) (defer)    Recommendations for Other Services PT consult    Precautions / Restrictions  Precautions Precautions: Fall;Sternal Precaution Booklet Issued: Yes (comment) Precaution Comments: pt able to recall precautions Restrictions Weight Bearing Restrictions: No Other Position/Activity Restrictions: sternal prec       Mobility Bed Mobility               General bed mobility comments: on BSC upon arrival, then up in chair at departure    Transfers Overall transfer level: Needs assistance Equipment used: 2 person hand held assist Transfers: Sit to/from Stand, Bed to chair/wheelchair/BSC Sit to Stand: Mod assist, +2 physical assistance     Step pivot transfers: Mod assist, +2 physical assistance           Balance Overall balance assessment: Needs assistance Sitting-balance support: No upper extremity supported, Feet supported Sitting balance-Leahy Scale: Fair     Standing balance support: Bilateral upper extremity supported, During functional activity Standing balance-Leahy Scale: Poor Standing balance comment: UE support and +2 min assist                           ADL either performed or assessed with clinical judgement   ADL Overall ADL's : Needs assistance/impaired     Grooming: Brushing hair;Minimal assistance;Sitting Grooming Details (indicate cue type and reason): min cues for sternal prec         Upper Body Dressing : Minimal assistance;Sitting   Lower Body Dressing: Minimal assistance Lower Body Dressing Details (indicate cue type and reason): simulated, can perform figure 4 in chair Toilet Transfer: Moderate assistance;+2 for physical assistance   Toileting- Clothing Manipulation and Hygiene: Minimal assistance Toileting - Clothing Manipulation Details (indicate cue type and reason): pulling up mesh underwear     Functional mobility during ADLs: Moderate assistance;+2 for physical assistance      Extremity/Trunk  Assessment Upper Extremity Assessment Upper Extremity Assessment: Generalized weakness   Lower  Extremity Assessment Lower Extremity Assessment: Defer to PT evaluation        Vision   Vision Assessment?: No apparent visual deficits   Perception Perception Perception: Not tested   Praxis Praxis Praxis: Not tested    Cognition Arousal/Alertness: Awake/alert Behavior During Therapy: Anxious Overall Cognitive Status: Within Functional Limits for tasks assessed                                 General Comments: can be tangential, able to follow commands with incresaed response time        Exercises Exercises: Other exercises Other Exercises Other Exercises: seated shoulder forward flexion x10 BUE Other Exercises: seated external rotation of BUE with elbows in x10    Shoulder Instructions       General Comments VSS    Pertinent Vitals/ Pain       Pain Assessment Pain Assessment: No/denies pain  Home Living                                          Prior Functioning/Environment              Frequency  Min 1X/week        Progress Toward Goals  OT Goals(current goals can now be found in the care plan section)  Progress towards OT goals: Progressing toward goals  Acute Rehab OT Goals Patient Stated Goal: none stated OT Goal Formulation: With patient Time For Goal Achievement:  Potential to Achieve Goals: Good ADL Goals Pt Will Perform Grooming: with supervision;sitting Pt Will Perform Upper Body Dressing: with min assist;sitting Pt Will Perform Lower Body Dressing: with mod assist;sit to/from stand;sitting/lateral leans Pt Will Transfer to Toilet: with min assist;squat pivot transfer;stand pivot transfer;bedside commode Additional ADL Goal #1: pt will complete bed mobility with min A in prep for ADLs Additional ADL Goal #2: Pt will be able to verbalize and adhere to sternal prec during 2 ADLs with min cues.  Plan Discharge plan remains appropriate;Frequency remains appropriate    Co-evaluation                  AM-PAC OT "6 Clicks" Daily Activity     Outcome Measure   Help from another person eating meals?: A Little Help from another person taking care of personal grooming?: A Little Help from another person toileting, which includes using toliet, bedpan, or urinal?: A Lot Help from another person bathing (including washing, rinsing, drying)?: A Lot Help from another person to put on and taking off regular upper body clothing?: A Little Help from another person to put on and taking off regular lower body clothing?: A Little 6 Click Score: 16    End of Session    OT Visit Diagnosis: Unsteadiness on feet (R26.81);Other abnormalities of gait and mobility (R26.89);Muscle weakness (generalized) (M62.81)   Activity Tolerance Patient tolerated treatment well   Patient Left with call bell/phone within reach;in chair   Nurse Communication Mobility status        Time: 5284-1324 OT Time Calculation (min): 18 min  Charges: OT General Charges $OT Visit: 1 Visit OT Treatments $Self Care/Home Management : 8-22 mins  Carver Fila, OTD, OTR/L SecureChat Preferred Acute Rehab [phone]   Dalphine Handing  05/21/2023, 12:14 PM

## 2023-05-21 NOTE — Progress Notes (Signed)
      [address]       [place] [address]             [phone]      POD # 10 VSD repair  BP 107/84   Pulse 68   Temp 98.8 F (37.1 C) (Oral)   Resp (!) 22   Ht 5\' 4"  (1.626 m)   Wt 97.2 kg   SpO2 91%   BMI 36.78 kg/m  RA 94% sat   Intake/Output Summary (Last 24 hours) at 05/21/2023 1746 Last data filed at 05/21/2023 1600 Gross per 24 hour  Intake 714.59 ml  Output 2725 ml  Net -2010.41 ml   Awaiting bed on 4E  Whitley Patchen C. Dorris Fetch, MD [facility] [phone]

## 2023-05-21 NOTE — Progress Notes (Signed)
Patient ID: [name], female   DOB: 1966/04/05, 57 y.o.   MRN: [id number]    Advanced Heart Failure Rounding Note   Subjective:    7/14: RHC mean RA 21, PA 50/25 mean 35, mean PCWP 28, CI 1.8 F/2.6 T, QpQs 3.3.  IABP placed.  7/19: Surgical repair of apical VSD.  7/20: IABP removed, extubated.   Now POD 10 s/p VSD repair.  Doing very well today, no complaints. Ambulating the halls. Off all pressors/inotropes.    Objective:     Vital Signs:   Temp:  [98 F (36.7 C)-98.9 F (37.2 C)] 98.7 F (37.1 C) (07/29 0350) Pulse Rate:  [66-81] 74 (07/29 0800) Resp:  [14-25] 24 (07/29 0800) BP: (86-127)/(58-104) 101/82 (07/29 0800) SpO2:  [83 %-100 %] 95 % (07/29 0800) Weight:  [97.2 kg] 97.2 kg (07/29 0600) Last BM Date : 05/19/23  Weight change: Filed Weights   05/18/23 0500 05/20/23 0500 05/21/23 0600  Weight: 104.6 kg 98.6 kg 97.2 kg    Intake/Output:   Intake/Output Summary (Last 24 hours) at 05/21/2023 0942 Last data filed at 05/21/2023 0400 Gross per 24 hour  Intake 927.52 ml  Output 3375 ml  Net -2447.48 ml     Physical Exam: General:  Sitting up in bed NAD HEENT: normal Neck: supple. JVP 7cm Cor: Sternal dressing  Regular rate & rhythm. No rubs, gallops or murmurs. Lungs: clear decreased at bases Abdomen: soft, nontender, nondistended. No hepatosplenomegaly. No bruits or masses. Good bowel sounds. Extremities: no cyanosis, clubbing, rash, edema s/p b/l TMA Neuro: alert & orientedx3, cranial nerves grossly intact. moves all 4 extremities w/o difficulty. Affect pleasant No peripheral edema.    Telemetry: Sinus 60s Personally reviewed  Labs: Basic Metabolic Panel: Recent Labs  Lab 05/17/23 0355 05/17/23 0948 05/17/23 1740 05/18/23 0340 05/19/23 0425 05/20/23 0343 05/20/23 2034 05/21/23 0120 05/21/23 0605  NA 130*   < > 129* 130* 131* 131*  --  131*  --   K 3.0*   < > 3.8 3.9 3.6 2.6* 2.7* 2.9* 3.3*  CL 88*   < > 92* 94* 95* 88*  --  87*  --   CO2  32   < > 27 27 25 27   --  31  --   GLUCOSE 146*   < > 207* 125* 106* 110*  --  111*  --   BUN 15   < > 17 18 20 20   --  26*  --   CREATININE 1.31*   < > 1.28* 1.34* 1.36* 1.41*  --  1.55*  --   CALCIUM 8.0*   < > 8.1* 8.3* 8.0* 8.3*  --  8.4*  --   MG 1.9  --   --  2.4 2.2 1.9  --  2.2  --    < > = values in this interval not displayed.    Liver Function Tests: No results for input(s): "AST", "ALT", "ALKPHOS", "BILITOT", "PROT", "ALBUMIN" in the last 168 hours.   No results for input(s): "LIPASE", "AMYLASE" in the last 168 hours. No results for input(s): "AMMONIA" in the last 168 hours.  CBC: Recent Labs  Lab 05/17/23 0355 05/18/23 0340 05/19/23 0425 05/20/23 0343 05/21/23 0431  WBC 11.7* 9.8 12.2* 12.9* 16.3*  HGB 7.8* 7.7* 7.8* 8.4* 9.3*  HCT 25.9* 24.9* 25.3* 27.0* 30.1*  MCV 90.9 92.6 92.7 87.7 87.2  PLT 358 412* 485* 579* 727*    Cardiac Enzymes: No results for input(s): "CKTOTAL", "CKMB", "CKMBINDEX", "TROPONINI" in  the last 168 hours.  BNP: BNP (last 3 results) Recent Labs    05/04/23 0620  BNP 123.2*    ProBNP (last 3 results) No results for input(s): "PROBNP" in the last 8760 hours.    Other results:  Imaging: VAS [place] LOWER EXTREMITY VENOUS (DVT)  Result Date: 05/20/2023  Lower Venous DVT Study Patient Name:  [name]  Date of Exam:   05/20/2023 Medical Rec #: [id number]    Accession #:    [id number] Date of Birth: 12-21-1965    Patient Gender: F Patient Age:   72 years Exam Location:  [facility] Procedure:      VAS [place] LOWER EXTREMITY VENOUS (DVT) Referring Phys: Theron Arista VANTRIGT --------------------------------------------------------------------------------  Indications: Heaviness and weakness of right lower extremity. Other Indications: Patient had balloon pump in right groin prior to VSD repair. Limitations: Body habitus. Comparison Study: Prior negative bilateral LEV done 05/16/22 Performing Technologist: Sherren Kerns RVS  Examination  Guidelines: A complete evaluation includes B-mode imaging, spectral Doppler, color Doppler, and power Doppler as needed of all accessible portions of each vessel. Bilateral testing is considered an integral part of a complete examination. Limited examinations for reoccurring indications may be performed as noted. The reflux portion of the exam is performed with the patient in reverse Trendelenburg.  +---------+---------------+---------+-----------+----------+-------------------+ RIGHT    CompressibilityPhasicitySpontaneityPropertiesThrombus Aging      +---------+---------------+---------+-----------+----------+-------------------+ CFV      Full           Yes      Yes                                      +---------+---------------+---------+-----------+----------+-------------------+ SFJ      Full                                                             +---------+---------------+---------+-----------+----------+-------------------+ FV Prox  Full                                                             +---------+---------------+---------+-----------+----------+-------------------+ FV Mid   Full                                                             +---------+---------------+---------+-----------+----------+-------------------+ FV DistalFull                                                             +---------+---------------+---------+-----------+----------+-------------------+ PFV      Full                                                             +---------+---------------+---------+-----------+----------+-------------------+  POP      Full           Yes                                               +---------+---------------+---------+-----------+----------+-------------------+ PTV                                                   Not well visualized +---------+---------------+---------+-----------+----------+-------------------+ PERO                                                   Not well visualized +---------+---------------+---------+-----------+----------+-------------------+   +----+---------------+---------+-----------+----------+--------------+ LEFTCompressibilityPhasicitySpontaneityPropertiesThrombus Aging +----+---------------+---------+-----------+----------+--------------+ CFV Full           Yes      Yes                                 +----+---------------+---------+-----------+----------+--------------+     Summary: RIGHT: - There is no evidence of deep vein thrombosis in the lower extremity.  - No cystic structure found in the popliteal fossa.  LEFT: - No evidence of common femoral vein obstruction.  *See table(s) above for measurements and observations. Electronically signed by Waverly Ferrari MD on 05/20/2023 at 5:02:23 PM.    Final    CT ABDOMEN PELVIS WO CONTRAST  Result Date: 05/20/2023 CLINICAL DATA:  Retroperitoneal hematoma, follow-up. Right leg pain. Question yeah EXAM: CT ABDOMEN AND PELVIS WITHOUT CONTRAST TECHNIQUE: Multidetector CT imaging of the abdomen and pelvis was performed following the standard protocol without IV contrast. RADIATION DOSE REDUCTION: This exam was performed according to the departmental dose-optimization program which includes automated exposure control, adjustment of the mA and/or kV according to patient size and/or use of iterative reconstruction technique. COMPARISON:  CT of the abdomen and pelvis without contrast 05/10/2022 FINDINGS: Lower chest: Left greater than right pleural effusions are present. Associated atelectasis is present. The VSD repair is noted. Radiopaque patch is present along the distal ventricular septum and extends into the pericardial space. A small pericardial effusion is present. Hepatobiliary: No focal liver abnormality is seen. Status post cholecystectomy. No biliary dilatation. Pancreas: Unremarkable. No pancreatic ductal dilatation or  surrounding inflammatory changes. Spleen: Normal in size without focal abnormality. Adrenals/Urinary Tract: Adrenal glands are unremarkable. Kidneys are normal, without renal calculi, focal lesion, or hydronephrosis. Bladder is unremarkable. Stomach/Bowel: The stomach and duodenum are within normal limits. The small bowel is unremarkable. Terminal ileum is within normal limits. Appendectomy noted. The ascending and transverse colon are within normal limits. The descending and sigmoid colon are normal. Vascular/Lymphatic: Atherosclerotic calcifications are present in the aorta and branch vessels without aneurysm. Minimal stranding is present about the catheter insertion site of the proximal right femoral artery. No associated hematoma is present. Reproductive: Status post hysterectomy. No adnexal masses. Other: No abdominal wall hernia or abnormality. No abdominopelvic ascites. Musculoskeletal: Mild subcutaneous edema is present. Asymmetric edema is noted adjacent to the right flank. No focal collection is present. The vertebral body heights and alignment are normal. No focal osseous lesions are  present. IMPRESSION: 1. Minimal stranding about the catheter insertion site of the proximal right femoral artery. No associated hematoma is present. 2. Left greater than right pleural effusions with associated atelectasis. 3. VSD repair as described. 4. Small pericardial effusion likely within normal limits following surgery. 5.  Aortic Atherosclerosis (ICD10-I70.0). Electronically Signed   By: Marin Roberts M.D.   On: 05/20/2023 12:24     Medications:     Scheduled Medications:  alteplase  2 mg Intracatheter Once   amiodarone  200 mg Oral BID   amitriptyline  50 mg Oral QHS   apixaban  5 mg Oral BID   bisacodyl  10 mg Oral BID   Or   bisacodyl  10 mg Rectal BID   Chlorhexidine Gluconate Cloth  6 each Topical Daily   chlorpheniramine-HYDROcodone  5 mL Oral BID   clopidogrel  75 mg Oral Daily   docusate  sodium  200 mg Oral BID   DULoxetine  60 mg Oral BID   feeding supplement  237 mL Oral BID BM   gabapentin  300 mg Oral BID   insulin aspart  0-9 Units Subcutaneous TID WC & HS   insulin glargine-yfgn  12 Units Subcutaneous BID   levothyroxine  25 mcg Oral Daily   linaclotide  145 mcg Oral QAC breakfast   mouth rinse  15 mL Mouth Rinse 4 times per day   pantoprazole  40 mg Oral Daily   polyethylene glycol  17 g Oral Daily   potassium chloride  60 mEq Oral Q4H   rosuvastatin  40 mg Oral Daily   sodium chloride flush  10-40 mL Intracatheter Q12H   spironolactone  25 mg Oral Daily   torsemide  40 mg Oral Daily    Infusions:  sodium chloride     sodium chloride Stopped (05/20/23 1634)    PRN Medications: sodium chloride, dextrose, guaiFENesin, HYDROmorphone, ipratropium-albuterol, lidocaine, methocarbamol, ondansetron (ZOFRAN) IV, mouth rinse, oxyCODONE, sodium chloride flush, traMADol    Patient Profile    Chanese Jelsma is a 57 y.o. 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 Admitted with STEMI s/p PDI to LAD now in cardiogenic shock.    Assessment/Plan:   Cardiogenic shock s/p VSD repair  - Echo 6/23 EF 55%, no RWMA, normal RV, trivial MR - Echo 7/12,  EF 45% - Echo 7/14: EF 60% with apical VSD, normal RV. Apical VSD creates large shunt by RHC QpQs 3.3.  - TEE 7/15 showed EF 55% with apical akinesis and apical VSD with significant L>R flow, RV mildly dilated and dysfunctional.   - OR 7/19 for surgical repair of apical VSD.  - 3.6L urine output yesterday, sCr 1.55 with rise in BUN. Will D/C metolazone. Continue torsemide 40mg  daily. Increase spiro to 25mg  daily for hyperkalemia. - Transfer to floor.   Anterior STEMI, CAD - HsTrop >24K on admission - s/p PCI to LAD 7/12, post DES 0% residual stenosis. Medical management of 40% stenosed RCA.  - LDL unable to be calculated  with high TGD, continue Crestor 40.  - Complicated by infarct-related VSD.  - On Plavix + apixaban + statin    DM II - A1c last 7.6 - SSI while admitted per primary - has diabetic foot ulcer, WOC has seen - ABI 12/22 were normal  4. PAF - in NSR on po amio - Eliquis restarted   5. Obesity - Body mass index is  37.76 kg/m.  - consider GLP-1 as OP   6. OSA - Using BIPAP at night  7. AKI - Resolved  8. Hyponatremia - Hypervolemic hyponatremia.  - Na stable at130 - Fluid restrict.   9. Anemia - Hgb 7.7 this am - 1 u RBCs on 07/20 - Transfuse Hgb < 7  10. Thrombocytopenia - IABP now out - Resolved  11. Acute hypoxemic respiratory failure - Now off BiPAP - O2 stable, down to on 3L HFNC - Encourage IS    Dorthula Nettles MD 05/21/2023 9:42 AM

## 2023-05-21 NOTE — Progress Notes (Signed)
      [address]       [place] [address]             [phone]                 10 Days Post-Op Procedure(s) (LRB): VENTRICULAR SEPTAL DEFECT (VSD) REPAIR (N/A) TRANSESOPHAGEAL ECHOCARDIOGRAM (N/A)   Events: No events _______________________________________________________________ Vitals: BP 98/83   Pulse 75   Temp 98.7 F (37.1 C) (Oral)   Resp 17   Ht 5\' 4"  (1.626 m)   Wt 97.2 kg   SpO2 (!) 89%   BMI 36.78 kg/m  Filed Weights   05/18/23 0500 05/20/23 0500 05/21/23 0600  Weight: 104.6 kg 98.6 kg 97.2 kg     - Neuro: alert NAD  - Cardiovascular: sinus  Drips:   none    - Pulm: EWOB    ABG    Component Value Date/Time   PHART 7.417 05/14/2023 0955   PCO2ART 49.4 (H) 05/14/2023 0955   PO2ART 116 (H) 05/14/2023 0955   HCO3 32.0 (H) 05/14/2023 0955   TCO2 33 (H) 05/14/2023 0955   ACIDBASEDEF 3.0 (H) 05/13/2023 2259   O2SAT 57.3 05/20/2023 0343    - Abd: ND - Extremity: warm  .Intake/Output      07/28 0701 07/29 0700 07/29 0701 07/30 0700   P.O. 490    I.V. (mL/kg) 2.5 (0)    IV Piggyback 435    Total Intake(mL/kg) 927.5 (9.5)    Urine (mL/kg/hr) 3625 (1.6)    Emesis/NG output 0    Stool 0    Total Output 3625    Net -2697.5         Stool Occurrence 0 x    Emesis Occurrence 0 x       _______________________________________________________________ Labs:    Latest Ref Rng & Units 05/21/2023    4:31 AM 05/20/2023    3:43 AM 05/19/2023    4:25 AM  CBC  WBC 4.0 - 10.5 K/uL 16.3  12.9  12.2   Hemoglobin 12.0 - 15.0 g/dL 9.3  8.4  7.8   Hematocrit 36.0 - 46.0 % 30.1  27.0  25.3   Platelets 150 - 400 K/uL 727  579  485       Latest Ref Rng & Units 05/21/2023    6:05 AM 05/21/2023    1:20 AM 05/20/2023    8:34 PM  CMP  Glucose 70 - 99 mg/dL  914    BUN 6 - 20 mg/dL  26    Creatinine 7.82 - 1.00 mg/dL  9.56    Sodium 213 - 086 mmol/L  131    Potassium 3.5 - 5.1 mmol/L 3.3  2.9  2.7   Chloride 98 - 111 mmol/L  87     CO2 22 - 32 mmol/L  31    Calcium 8.9 - 10.3 mg/dL  8.4      CXR: stable  _______________________________________________________________  Assessment and Plan: POD 10 s/p VSD repair  Neuro: pain controlled CV: appreciate heart failure team.  On plavix for stent Pulm: IS, ambulation.  On room air Renal:   Watching creat GI: on diet Heme: on plavix.  On eliquis for felt repair ID: afebrile Endo: SSI Dispo: possible transfer to 2C.   Corliss Skains 05/21/2023 8:02 AM

## 2023-05-21 NOTE — Progress Notes (Signed)
Physical Therapy Treatment Patient Details Name: [name] MRN: [id number] DOB: [birth year] Today's Date: 05/21/2023   History of Present Illness Pt is 57 year old presented to [facility] on  05/04/23 for STEMI and underwent PCI to LAD on 7/12. Pt with cardiogenic [name] and acute heart failure. Pt continued with chest pain and found to have apical ventral septal defect. Pt underwent rt heart cath and IABP placed on 7/14. Underwent surgical ventral septal defect repair on 7/19. IABP removed and pt extubated 7/20. 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    PT Comments   Pt with improved transfer ability however remains limited by deconditioning and orders for limited WBing on R foot due to large wound. Pt reports using an electric scooter at home for the last 2 months due to R foot wound and pain. Pt reports sitting up in the chair for 6hrs daily. Acute PT to cont to follow to progress transfer ability and improved activity tolerance.    If plan is discharge home, recommend the following: A lot of help with walking and/or transfers;A lot of help with bathing/dressing/bathroom;Assistance with cooking/housework;Assist for transportation;Help with stairs or ramp for entrance   Can travel by private vehicle        Equipment Recommendations  Other (comment) (TBA)    Recommendations for Other Services Rehab consult     Precautions / Restrictions Precautions Precautions: Fall;Sternal Precaution Booklet Issued: Yes (comment) Precaution Comments: pt able to recall precautions Restrictions Weight Bearing Restrictions: No Other Position/Activity Restrictions: sternal prec     Mobility  Bed Mobility               General bed mobility comments: pt up in chair upon PT arrival with request to use  [facility]    Transfers Overall transfer level: Needs assistance Equipment used: 2 person hand held assist Transfers: Sit to/from Stand, Bed to chair/wheelchair/BSC Sit to Stand: Mod assist, +2 physical assistance   Step pivot transfers: Mod assist, +2 physical assistance       General transfer comment: mod A due to having pt not pull with BUE, pt able to complete 2 std pvts, to/from chair/BSC, increased time, noted SOB, SpO2 at 86% with mobility on 3Lo2 via [place]. stood again for 3rd time and completed 12 marches in place prior to needing to sit    Ambulation/Gait               General Gait Details: limited to step pvt transfer due to pt with large wound on R foot in which she states she isn't suppose to walk on it and uses an Art gallery manager at home   Stairs             Wheelchair Mobility     Tilt Bed    Modified Rankin (Stroke Patients Only)       Balance Overall balance assessment: Needs assistance Sitting-balance support: No upper extremity supported, Feet supported Sitting balance-Leahy Scale: Fair     Standing balance support: Bilateral upper extremity supported, During functional activity Standing balance-Leahy Scale: Poor Standing balance comment: UE support and +2 min assist                            Cognition Arousal/Alertness: Awake/alert Behavior During Therapy: Anxious Overall Cognitive Status: Within Functional Limits for tasks assessed  General Comments: can be tangential, able to follow commands with incresaed response time        Exercises General Exercises - Lower Extremity Long Arc Quad: AROM, Both, 5 reps, Seated (with 5 sec hold at endrange) Hip Flexion/Marching: AROM, Both, 10 reps, Standing    General Comments General comments (skin integrity, edema, etc.): SpO2 dec to 86% on 3LO2 via [place] during mobility      Pertinent Vitals/Pain Pain Assessment Pain Assessment:  0-10 Pain Score: 8  Pain Location: chest incision, especially when needed to cough Pain Descriptors / Indicators: Operative site guarding, Grimacing Pain Intervention(s): Monitored during session    Home Living                          Prior Function            PT Goals (current goals can now be found in the care plan section) Acute Rehab PT Goals PT Goal Formulation: With patient Time For Goal Achievement: 05/28/23 Potential to Achieve Goals: Fair Progress towards PT goals: Progressing toward goals    Frequency    Min 1X/week      PT Plan Current plan remains appropriate    Co-evaluation              AM-PAC PT "6 Clicks" Mobility   Outcome Measure  Help needed turning from your back to your side while in a flat bed without using bedrails?: A Lot Help needed moving from lying on your back to sitting on the side of a flat bed without using bedrails?: A Lot Help needed moving to and from a bed to a chair (including a wheelchair)?: A Lot Help needed standing up from a chair using your arms (e.g., wheelchair or bedside chair)?: A Lot Help needed to walk in hospital room?: Total Help needed climbing 3-5 steps with a railing? : Total 6 Click Score: 10    End of Session Equipment Utilized During Treatment: Oxygen Activity Tolerance: Patient limited by pain Patient left: in chair;with call bell/phone within reach;with chair alarm set (left with OT) Nurse Communication: Mobility status PT Visit Diagnosis: Other abnormalities of gait and mobility (R26.89);Muscle weakness (generalized) (M62.81);Difficulty in walking, not elsewhere classified (R26.2);Pain Pain - part of body:  (chest)     Time: 8119-1478 PT Time Calculation (min) (ACUTE ONLY): 16 min  Charges:    $Therapeutic Activity: 8-22 mins PT General Charges $$ ACUTE PT VISIT: 1 Visit                     [name], PT, DPT Acute Rehabilitation Services Secure chat preferred Office #:  [phone]    [name] 05/21/2023, 9:09 AM

## 2023-05-22 DIAGNOSIS — I232 Ventricular septal defect as current complication following acute myocardial infarction: Secondary | ICD-10-CM | POA: Diagnosis not present

## 2023-05-22 DIAGNOSIS — J9601 Acute respiratory failure with hypoxia: Secondary | ICD-10-CM | POA: Diagnosis not present

## 2023-05-22 DIAGNOSIS — I2102 ST elevation (STEMI) myocardial infarction involving left anterior descending coronary artery: Secondary | ICD-10-CM | POA: Diagnosis not present

## 2023-05-22 LAB — GLUCOSE, CAPILLARY
Glucose-Capillary: 167 mg/dL — ABNORMAL HIGH (ref 70–99)
Glucose-Capillary: 194 mg/dL — ABNORMAL HIGH (ref 70–99)
Glucose-Capillary: 146 mg/dL — ABNORMAL HIGH (ref 70–99)
Glucose-Capillary: 178 mg/dL — ABNORMAL HIGH (ref 70–99)

## 2023-05-22 LAB — BASIC METABOLIC PANEL
Anion gap: 15 (ref 5–15)
BUN: 29 mg/dL — ABNORMAL HIGH (ref 6–20)
CO2: 29 mmol/L (ref 22–32)
Calcium: 8.5 mg/dL — ABNORMAL LOW (ref 8.9–10.3)
Chloride: 84 mmol/L — ABNORMAL LOW (ref 98–111)
Creatinine, Ser: 1.67 mg/dL — ABNORMAL HIGH (ref 0.44–1.00)
GFR, Estimated: 36 mL/min — ABNORMAL LOW (ref >60)
Glucose, Bld: 192 mg/dL — ABNORMAL HIGH (ref 70–99)
Potassium: 3.2 mmol/L — ABNORMAL LOW (ref 3.5–5.1)
Sodium: 128 mmol/L — ABNORMAL LOW (ref 135–145)

## 2023-05-22 MED ORDER — POTASSIUM CHLORIDE CRYS ER 20 MEQ PO TBCR
60.0000 meq | EXTENDED_RELEASE_TABLET | Freq: Three times a day (TID) | ORAL | Status: DC
  Administered 2023-05-22 – 2023-05-23 (×4): 60 meq via ORAL
  Filled 2023-05-22 (×4): qty 3

## 2023-05-22 MED ORDER — POTASSIUM CHLORIDE CRYS ER 20 MEQ PO TBCR: 60.0000 meq | EXTENDED_RELEASE_TABLET | Freq: Two times a day (BID) | ORAL | Status: DC

## 2023-05-22 MED ORDER — POTASSIUM CHLORIDE 10 MEQ/100ML IV SOLN
10.0000 meq | INTRAVENOUS | Status: AC
  Administered 2023-05-22 (×6): 10 meq via INTRAVENOUS
  Filled 2023-05-22 (×6): qty 100

## 2023-05-22 MED ORDER — MAGNESIUM SULFATE 4 GM/100ML IV SOLN
4.0000 g | Freq: Once | INTRAVENOUS | Status: AC
  Administered 2023-05-22: 4 g via INTRAVENOUS
  Filled 2023-05-22: qty 100

## 2023-05-22 NOTE — Progress Notes (Addendum)
Patient ID: [name], female   DOB: [birth year], 57 y.o.   MRN: [id number]    Advanced Heart Failure Rounding Note   Subjective:    7/14: RHC mean RA 21, PA 50/25 mean 35, mean PCWP 28, CI 1.8 F/2.6 T, QpQs 3.3.  IABP placed.  7/19: Surgical repair of apical VSD.  7/20: IABP removed, extubated.   Post op day 11  Scr up to 1.78. Well below pre-op weight.   Concerned about b/l foot wounds.     Objective:     Vital Signs:   Temp:  [97.9 F (36.6 C)-98.8 F (37.1 C)] 98.7 F (37.1 C) (07/30 0721) Pulse Rate:  [68-78] 76 (07/30 0820) Resp:  [14-26] 21 (07/30 0820) BP: (90-139)/(49-126) 111/70 (07/30 0820) SpO2:  [82 %-94 %] 94 % (07/30 0820) Weight:  [96.4 kg] 96.4 kg (07/30 0541) Last BM Date : 05/19/23  Weight change: Filed Weights   05/20/23 0500 05/21/23 0600 05/22/23 0541  Weight: 98.6 kg 97.2 kg 96.4 kg    Intake/Output:   Intake/Output Summary (Last 24 hours) at 05/22/2023 0831 Last data filed at 05/22/2023 0800 Gross per 24 hour  Intake 679.71 ml  Output 2000 ml  Net -1320.29 ml     Physical Exam: General:  Sitting up in bed.  HEENT: normal Neck: supple. no JVD. Carotids 2+ bilat; no bruits.  Cor: PMI nondisplaced. Regular rate & rhythm. No rubs, gallops or murmurs. Sternum stable. Lungs: clear Abdomen: obese, soft, nontender, nondistended.  Extremities: no cyanosis, clubbing, rash, s/p bl TMA. Dressings over stump wounds. Neuro: alert & orientedx3. Affect pleasant    Telemetry: Sinus rhythm 70s Personally reviewed  Labs: Basic Metabolic Panel: Recent Labs  Lab 05/18/23 0340 05/19/23 0425 05/20/23 0343 05/20/23 2034 05/21/23 0120 05/21/23 0605 05/21/23 1717 05/22/23 0155  NA 130* 131* 131*  --  131*  --  131* 130*  K 3.9 3.6 2.6* 2.7* 2.9* 3.3* 2.8* 3.7  CL 94* 95* 88*  --  87*  --  82* 85*  CO2 27 25 27   --  31  --  31 29  GLUCOSE 125* 106* 110*  --  111*  --  108* 128*  BUN 18 20 20   --  26*  --  27* 28*  CREATININE 1.34* 1.36*  1.41*  --  1.55*  --  1.70* 1.78*  CALCIUM 8.3* 8.0* 8.3*  --  8.4*  --  8.8* 8.4*  MG 2.4 2.2 1.9  --  2.2  --   --  1.8    Liver Function Tests: No results for input(s): "AST", "ALT", "ALKPHOS", "BILITOT", "PROT", "ALBUMIN" in the last 168 hours.   No results for input(s): "LIPASE", "AMYLASE" in the last 168 hours. No results for input(s): "AMMONIA" in the last 168 hours.  CBC: Recent Labs  Lab 05/18/23 0340 05/19/23 0425 05/20/23 0343 05/21/23 0431 05/22/23 0155  WBC 9.8 12.2* 12.9* 16.3* 17.8*  HGB 7.7* 7.8* 8.4* 9.3* 9.3*  HCT 24.9* 25.3* 27.0* 30.1* 28.9*  MCV 92.6 92.7 87.7 87.2 85.5  PLT 412* 485* 579* 727* 714*    Cardiac Enzymes: No results for input(s): "CKTOTAL", "CKMB", "CKMBINDEX", "TROPONINI" in the last 168 hours.  BNP: BNP (last 3 results) Recent Labs    05/04/23 0620  BNP 123.2*    ProBNP (last 3 results) No results for input(s): "PROBNP" in the last 8760 hours.    Other results:  Imaging: DG Chest Port 1 View  Result Date: 05/21/2023 CLINICAL DATA:  Status post  device closure of VSD. EXAM: PORTABLE CHEST 1 VIEW COMPARISON:  Chest x-ray dated 05/19/2023. FINDINGS: Heart size and mediastinal contours are stable. Hazy opacity at the LEFT lung base. No pneumothorax is seen. Median sternotomy wires appear intact and stable in alignment. LEFT-sided PICC line is stable in position with tip at the level of the lower SVC. IMPRESSION: 1. Hazy opacity at the LEFT lung base, atelectasis versus pneumonia, not significantly changed compared to the chest x-ray of 05/19/2023. 2. Probable small LEFT pleural effusion. Electronically Signed   By: Bary Richard M.D.   On: 05/21/2023 10:11   VAS [place] LOWER EXTREMITY VENOUS (DVT)  Result Date: 05/20/2023  Lower Venous DVT Study Patient Name:  PARNEET CHAVANA  Date of Exam:   05/20/2023 Medical Rec #: [id number]    Accession #:    [id number] Date of Birth: 10-31-1965    Patient Gender: F Patient Age:   42 years Exam Location:   [facility] Procedure:      VAS [place] LOWER EXTREMITY VENOUS (DVT) Referring Phys: Theron Arista VANTRIGT --------------------------------------------------------------------------------  Indications: Heaviness and weakness of right lower extremity. Other Indications: Patient had balloon pump in right groin prior to VSD repair. Limitations: Body habitus. Comparison Study: Prior negative bilateral LEV done 05/16/22 Performing Technologist: Sherren Kerns RVS  Examination Guidelines: A complete evaluation includes B-mode imaging, spectral Doppler, color Doppler, and power Doppler as needed of all accessible portions of each vessel. Bilateral testing is considered an integral part of a complete examination. Limited examinations for reoccurring indications may be performed as noted. The reflux portion of the exam is performed with the patient in reverse Trendelenburg.  +---------+---------------+---------+-----------+----------+-------------------+ RIGHT    CompressibilityPhasicitySpontaneityPropertiesThrombus Aging      +---------+---------------+---------+-----------+----------+-------------------+ CFV      Full           Yes      Yes                                      +---------+---------------+---------+-----------+----------+-------------------+ SFJ      Full                                                             +---------+---------------+---------+-----------+----------+-------------------+ FV Prox  Full                                                             +---------+---------------+---------+-----------+----------+-------------------+ FV Mid   Full                                                             +---------+---------------+---------+-----------+----------+-------------------+ FV DistalFull                                                             +---------+---------------+---------+-----------+----------+-------------------+  PFV      Full                                                              +---------+---------------+---------+-----------+----------+-------------------+ POP      Full           Yes                                               +---------+---------------+---------+-----------+----------+-------------------+ PTV                                                   Not well visualized +---------+---------------+---------+-----------+----------+-------------------+ PERO                                                  Not well visualized +---------+---------------+---------+-----------+----------+-------------------+   +----+---------------+---------+-----------+----------+--------------+ LEFTCompressibilityPhasicitySpontaneityPropertiesThrombus Aging +----+---------------+---------+-----------+----------+--------------+ CFV Full           Yes      Yes                                 +----+---------------+---------+-----------+----------+--------------+     Summary: RIGHT: - There is no evidence of deep vein thrombosis in the lower extremity.  - No cystic structure found in the popliteal fossa.  LEFT: - No evidence of common femoral vein obstruction.  *See table(s) above for measurements and observations. Electronically signed by Waverly Ferrari MD on 05/20/2023 at 5:02:23 PM.    Final    CT ABDOMEN PELVIS WO CONTRAST  Result Date: 05/20/2023 CLINICAL DATA:  Retroperitoneal hematoma, follow-up. Right leg pain. Question yeah EXAM: CT ABDOMEN AND PELVIS WITHOUT CONTRAST TECHNIQUE: Multidetector CT imaging of the abdomen and pelvis was performed following the standard protocol without IV contrast. RADIATION DOSE REDUCTION: This exam was performed according to the departmental dose-optimization program which includes automated exposure control, adjustment of the mA and/or kV according to patient size and/or use of iterative reconstruction technique. COMPARISON:  CT of the abdomen and pelvis without  contrast 05/10/2022 FINDINGS: Lower chest: Left greater than right pleural effusions are present. Associated atelectasis is present. The VSD repair is noted. Radiopaque patch is present along the distal ventricular septum and extends into the pericardial space. A small pericardial effusion is present. Hepatobiliary: No focal liver abnormality is seen. Status post cholecystectomy. No biliary dilatation. Pancreas: Unremarkable. No pancreatic ductal dilatation or surrounding inflammatory changes. Spleen: Normal in size without focal abnormality. Adrenals/Urinary Tract: Adrenal glands are unremarkable. Kidneys are normal, without renal calculi, focal lesion, or hydronephrosis. Bladder is unremarkable. Stomach/Bowel: The stomach and duodenum are within normal limits. The small bowel is unremarkable. Terminal ileum is within normal limits. Appendectomy noted. The ascending and transverse colon are within normal limits. The descending and sigmoid colon are normal. Vascular/Lymphatic: Atherosclerotic calcifications are present in the aorta and branch vessels without aneurysm. Minimal stranding  is present about the catheter insertion site of the proximal right femoral artery. No associated hematoma is present. Reproductive: Status post hysterectomy. No adnexal masses. Other: No abdominal wall hernia or abnormality. No abdominopelvic ascites. Musculoskeletal: Mild subcutaneous edema is present. Asymmetric edema is noted adjacent to the right flank. No focal collection is present. The vertebral body heights and alignment are normal. No focal osseous lesions are present. IMPRESSION: 1. Minimal stranding about the catheter insertion site of the proximal right femoral artery. No associated hematoma is present. 2. Left greater than right pleural effusions with associated atelectasis. 3. VSD repair as described. 4. Small pericardial effusion likely within normal limits following surgery. 5.  Aortic Atherosclerosis (ICD10-I70.0).  Electronically Signed   By: Marin Roberts M.D.   On: 05/20/2023 12:24     Medications:     Scheduled Medications:  alteplase  2 mg Intracatheter Once   amiodarone  200 mg Oral BID   amitriptyline  50 mg Oral QHS   apixaban  5 mg Oral BID   bisacodyl  10 mg Oral BID   Or   bisacodyl  10 mg Rectal BID   Chlorhexidine Gluconate Cloth  6 each Topical Daily   chlorpheniramine-HYDROcodone  5 mL Oral BID   clopidogrel  75 mg Oral Daily   [place] Patient [facility] Family Education   Does not apply Once   docusate sodium  200 mg Oral BID   DULoxetine  60 mg Oral BID   feeding supplement  237 mL Oral BID BM   gabapentin  300 mg Oral BID   insulin aspart  0-9 Units Subcutaneous TID WC & HS   insulin glargine-yfgn  12 Units Subcutaneous BID   levothyroxine  25 mcg Oral Daily   linaclotide  145 mcg Oral QAC breakfast   mouth rinse  15 mL Mouth Rinse 4 times per day   pantoprazole  40 mg Oral Daily   polyethylene glycol  17 g Oral Daily   rosuvastatin  40 mg Oral Daily   sodium chloride flush  10-40 mL Intracatheter Q12H   sodium chloride flush  3 mL Intravenous Q12H   spironolactone  25 mg Oral Daily   torsemide  40 mg Oral Daily    Infusions:  sodium chloride     sodium chloride Stopped (05/21/23 0429)   sodium chloride      PRN Medications: sodium chloride, sodium chloride, dextrose, guaiFENesin, HYDROmorphone, ipratropium-albuterol, lidocaine, methocarbamol, ondansetron (ZOFRAN) IV, mouth rinse, mouth rinse, oxyCODONE, sodium chloride flush, sodium chloride flush, traMADol    Patient Profile    Michaelann Dach is a 57 y.o. 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 Admitted with STEMI s/p PDI to LAD now in cardiogenic shock.    Assessment/Plan:   Cardiogenic shock s/p VSD repair  - Echo 6/23 EF 55%, no RWMA, normal RV, trivial MR - Echo 7/12,  EF  45% - Echo 7/14: EF 60% with apical VSD, normal RV. Apical VSD creates large shunt by RHC QpQs 3.3.  - TEE 7/15 showed EF 55% with apical akinesis and apical VSD with significant L>R flow, RV mildly dilated and dysfunctional.   - OR 7/19 for surgical repair of apical VSD.  - Scr trending up. Below pre-op weight. Hold Torsemide.  - Hold spiro - No SGLT2i for now with risk of infection d/t limited mobility  Anterior STEMI, CAD - HsTrop >24K on admission -  s/p PCI to LAD 7/12, post DES 0% residual stenosis. Medical management of 40% stenosed RCA.  - LDL unable to be calculated with high TGD, continue Crestor 40.  - Complicated by infarct-related VSD.  - On Plavix + apixaban + statin  DM II - A1c last 7.6 - SSI while admitted per primary - has diabetic foot ulcers. Large wound on right side. Reconsult WOC. - ABI 12/22 were normal  4. PAF - in NSR on po amio - Continue Eliquis   5. Obesity - Body mass index is 37.76 kg/m.  - consider GLP-1 as OP   6. OSA - Using BIPAP at night  7. AKI - Scr trending back up, 1.36>1.78 over last few days. Hold diuretics as above  8. Hyponatremia - Hypervolemic hyponatremia.  - Na stable at 130 - Fluid restrict.   9. Anemia - 1 u RBCs on 07/20 - Hgb improved to 9.3 - Transfuse Hgb < 7  10. Thrombocytopenia - IABP now out - Resolved  11. Acute hypoxemic respiratory failure - Now off BiPAP - O2 stable, now on RA - Encourage IS  12. Hypokalemia - K 3.7. Checked while K supp running.  -Repeat BMET   Awaiting transfer to the floor.  Kayleigh Broadwell N PA-C 05/22/2023 8:31 AM

## 2023-05-22 NOTE — Consult Note (Signed)
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 per ortho notes and [facility]; is receiving [facility], last note from their office 04/16/2023 states dry dressing however husband states he has been putting Hydofera Blue on this area and has noticed an area of tunneling as well WOC previously saw this wound on 05/04/2023 and wrote orders for silver packing  Reason for Consult: patient request WOC reassess wound  Wound type: full thickness, diabetic ulcer R stump  Pressure Injury POA: NA  Measurement: 1 cm x 1 cm x 0.5 cm with 1 cm tunneling at 2 o'clock  Wound bed: 50% red moist 50% yellow fibrin  Drainage (amount, consistency, odor) minimal serosanguinous  Periwound: well healed surgical incision, area heavily callused   Dressing procedure/placement/frequency: Clean R stump ulcer with Vashe wound cleanser [place] [phone]), moisten 1/2" plain packing strip with Vashe and using Q tip applicator insert packing strip into tunneled area at 2 o'clock then fill remaining wound bed with Vashe moistened packing strip twice daily.  Cover with dry gauze and silicone foam.  May lift foam to change packing strip.  Change foam dressing q3 days and prn soiling.   Discussed with patient continuing follow-up care for wound at [facility] as she was prior to admission.    WOC team will not follow.  Re-consult if further needs arise.   Thank you,    Priscella Mann MSN, RN-BC, [employer] [phone]

## 2023-05-22 NOTE — Progress Notes (Signed)
Inpatient Rehabilitation Admissions Coordinator   I have insurance approval for CIR. I await medical clearance to admit as well as bed availability. Please clarify when ready to discharge to CIR. I will follow up tomorrow.  Ottie Glazier, RN, MSN Rehab Admissions Coordinator [phone] 05/22/2023 11:13 AM

## 2023-05-22 NOTE — Progress Notes (Signed)
   NAME:  [name], MRN:  [id number], DOB:  Feb 20, 1966, LOS: 18 ADMISSION DATE:  05/04/2023, CONSULTATION DATE: 05/11/2023 REFERRING MD: Dr. Cliffton Asters, CHIEF COMPLAINT: Chest pain  History of Present Illness:  57 year old female with a diabetes melitis, complicated with neuropathy and Wegener's granulomatosis who was initially admitted with acute anterior STEMI underwent PCI to LAD, who continues to require vasopressor support and IABP, echocardiogram showed mechanical complication including apical VSD, underwent VSD repair, remained intubated, on multiple vasopressor support, PCCM was consulted for help evaluation medical management  Pertinent  Medical History   Past Medical History:  Diagnosis Date   Chronic cough    Chronic pain    Diabetes mellitus without complication (HCC)    Dyspnea    GERD (gastroesophageal reflux disease)    Hypokalemia    Hypothyroidism    Left wrist pain 06/01/2021   Myonecrosis (HCC) 07/01/2021   Neurocardiogenic syncope    OSA (obstructive sleep apnea)    does not use CPAP   Osteomyelitis (HCC)    bilateral feet   Peripheral neuropathy    Presence of intrathecal pump    recieves Prialt/bupivicaine   Tear of gluteus medius tendon 07/01/2021   Wegener's granulomatosis 2009   Wegner's disease (congenital syphilitic osteochondritis)      Significant Hospital Events: Including procedures, antibiotic start and stop dates in addition to other pertinent events   Repair of small VSD following myocardial infarction.  Interim History / Subjective:  Doing well.  Off oxygen and vasoactive infusions.  Objective   Blood pressure 113/77, pulse 72, temperature 97.8 F (36.6 C), temperature source Oral, resp. rate 20, height 5\' 4"  (1.626 m), weight 96.4 kg, SpO2 (!) 89%.        Intake/Output Summary (Last 24 hours) at 05/22/2023 1319 Last data filed at 05/22/2023 1300 Gross per 24 hour  Intake 851.62 ml  Output 1850 ml  Net -998.38 ml   Filed Weights    05/20/23 0500 05/21/23 0600 05/22/23 0541  Weight: 98.6 kg 97.2 kg 96.4 kg    Examination: General: Middle-age morbidly obese female, lying on the bed HEENT: [place]/AT, eyes anicteric.  moist mucus membranes Neuro: Alert, awake following commands Chest: Chest is clear. Heart: Regular rate and rhythm, no murmurs or gallops Abdomen: Soft, nontender, nondistended, bowel sounds present Skin: No rash  Ancillary tests personally reviewed:  Sodium 128, sodium 3.2.  Creatinine improving at 1.62.  Assessment & Plan:  Coronary artery disease, presented with acute anterior STEMI, complicated with apical VSD. Apical VSD s/p surgical repair Acute HFrEF with cardiogenic shock Acute respiratory failure with hypoxia/hypercapnia, due to pulmonary edema Mucous plugging Hyperlipidemia Acute kidney injury due to ischemic ATN Hypovolemic hyponatremia Hypokalemia Metabolic alkalosis Diabetes type 2 Expected perioperative blood loss anemia Morbid obesity OSA  Plan:  -Progressing well.  Ready to move to floor. -Hold diuretic today given rising creatinine and clinical euvolemia. -Adequate pain control at this time. -Potassium supplementation  -Continue current insulin regimen.  Best Practice (right click and "Reselect all SmartList Selections" daily)   Diet/type: Regular consistency DVT prophylaxis: Apixaban GI prophylaxis: PPI Lines: Central line, yes still needed  Foley:  Yes, and it is still needed Code Status:  full code Last date of multidisciplinary goals of care discussion [Per primary team]  Lynnell Catalan, MD [facility] ICU Physician [facility] [place] [facility]  Pager: [phone] Or Epic Secure Chat After hours: [phone].  05/22/2023, 1:22 PM

## 2023-05-22 NOTE — Plan of Care (Signed)
  Problem: Education: Goal: Knowledge of General Education information will improve Description: Including pain rating scale, medication(s)/side effects and non-pharmacologic comfort measures Outcome: Progressing   Problem: Health Behavior/Discharge Planning: Goal: Ability to manage health-related needs will improve Outcome: Progressing   Problem: Clinical Measurements: Goal: Ability to maintain clinical measurements within normal limits will improve Outcome: Progressing Goal: Will remain free from infection Outcome: Progressing Goal: Diagnostic test results will improve Outcome: Progressing Goal: Respiratory complications will improve Outcome: Progressing Goal: Cardiovascular complication will be avoided Outcome: Progressing   Problem: Activity: Goal: Risk for activity intolerance will decrease Outcome: Progressing   Problem: Nutrition: Goal: Adequate nutrition will be maintained Outcome: Progressing   Problem: Coping: Goal: Level of anxiety will decrease Outcome: Progressing   Problem: Elimination: Goal: Will not experience complications related to bowel motility Outcome: Progressing Goal: Will not experience complications related to urinary retention Outcome: Progressing   Problem: Pain Managment: Goal: General experience of comfort will improve Outcome: Progressing   Problem: Safety: Goal: Ability to remain free from injury will improve Outcome: Progressing   Problem: Skin Integrity: Goal: Risk for impaired skin integrity will decrease Outcome: Progressing   Problem: Education: Goal: Understanding of CV disease, CV risk reduction, and recovery process will improve Outcome: Progressing Goal: Individualized Educational Video(s) Outcome: Progressing   Problem: Activity: Goal: Ability to return to baseline activity level will improve Outcome: Progressing   Problem: Cardiovascular: Goal: Ability to achieve and maintain adequate cardiovascular perfusion  will improve Outcome: Progressing Goal: Vascular access site(s) Level 0-1 will be maintained Outcome: Progressing   Problem: Health Behavior/Discharge Planning: Goal: Ability to safely manage health-related needs after discharge will improve Outcome: Progressing   Problem: Education: Goal: Ability to describe self-care measures that may prevent or decrease complications (Diabetes Survival Skills Education) will improve Outcome: Progressing Goal: Individualized Educational Video(s) Outcome: Progressing   Problem: Coping: Goal: Ability to adjust to condition or change in health will improve Outcome: Progressing   Problem: Fluid Volume: Goal: Ability to maintain a balanced intake and output will improve Outcome: Progressing   Problem: Health Behavior/Discharge Planning: Goal: Ability to identify and utilize available resources and services will improve Outcome: Progressing Goal: Ability to manage health-related needs will improve Outcome: Progressing   Problem: Metabolic: Goal: Ability to maintain appropriate glucose levels will improve Outcome: Progressing   Problem: Nutritional: Goal: Maintenance of adequate nutrition will improve Outcome: Progressing Goal: Progress toward achieving an optimal weight will improve Outcome: Progressing   Problem: Skin Integrity: Goal: Risk for impaired skin integrity will decrease Outcome: Progressing   Problem: Tissue Perfusion: Goal: Adequacy of tissue perfusion will improve Outcome: Progressing   Problem: Education: Goal: Will demonstrate proper wound care and an understanding of methods to prevent future damage Outcome: Progressing Goal: Knowledge of disease or condition will improve Outcome: Progressing Goal: Knowledge of the prescribed therapeutic regimen will improve Outcome: Progressing Goal: Individualized Educational Video(s) Outcome: Progressing   Problem: Activity: Goal: Risk for activity intolerance will  decrease Outcome: Progressing   Problem: Cardiac: Goal: Will achieve and/or maintain hemodynamic stability Outcome: Progressing   Problem: Clinical Measurements: Goal: Postoperative complications will be avoided or minimized Outcome: Progressing   Problem: Respiratory: Goal: Respiratory status will improve Outcome: Progressing   Problem: Skin Integrity: Goal: Wound healing without signs and symptoms of infection Outcome: Progressing Goal: Risk for impaired skin integrity will decrease Outcome: Progressing   Problem: Urinary Elimination: Goal: Ability to achieve and maintain adequate renal perfusion and functioning will improve Outcome: Progressing

## 2023-05-22 NOTE — Progress Notes (Signed)
      [address]       [place] [address]             [phone]                 11 Days Post-Op Procedure(s) (LRB): VENTRICULAR SEPTAL DEFECT (VSD) REPAIR (N/A) TRANSESOPHAGEAL ECHOCARDIOGRAM (N/A)   Events: No events _______________________________________________________________ Vitals: BP 109/75   Pulse 78   Temp 98.7 F (37.1 C) (Oral)   Resp 14   Ht 5\' 4"  (1.626 m)   Wt 96.4 kg   SpO2 (!) 88%   BMI 36.48 kg/m  Filed Weights   05/20/23 0500 05/21/23 0600 05/22/23 0541  Weight: 98.6 kg 97.2 kg 96.4 kg     - Neuro: alert NAD  - Cardiovascular: sinus  Drips:   none    - Pulm: EWOB    ABG    Component Value Date/Time   PHART 7.417 05/14/2023 0955   PCO2ART 49.4 (H) 05/14/2023 0955   PO2ART 116 (H) 05/14/2023 0955   HCO3 32.0 (H) 05/14/2023 0955   TCO2 33 (H) 05/14/2023 0955   ACIDBASEDEF 3.0 (H) 05/13/2023 2259   O2SAT 57.3 05/20/2023 0343    - Abd: ND - Extremity: warm  .Intake/Output      07/29 0701 07/30 0700 07/30 0701 07/31 0700   P.O. 440    I.V. (mL/kg) 39.8 (0.4)    IV Piggyback 199.9    Total Intake(mL/kg) 679.7 (7.1)    Urine (mL/kg/hr) 1850 (0.8)    Emesis/NG output 0    Stool 0    Total Output 1850    Net -1170.3         Urine Occurrence 2 x    Stool Occurrence 0 x    Emesis Occurrence 0 x       _______________________________________________________________ Labs:    Latest Ref Rng & Units 05/22/2023    1:55 AM 05/21/2023    4:31 AM 05/20/2023    3:43 AM  CBC  WBC 4.0 - 10.5 K/uL 17.8  16.3  12.9   Hemoglobin 12.0 - 15.0 g/dL 9.3  9.3  8.4   Hematocrit 36.0 - 46.0 % 28.9  30.1  27.0   Platelets 150 - 400 K/uL 714  727  579       Latest Ref Rng & Units 05/22/2023    1:55 AM 05/21/2023    5:17 PM 05/21/2023    6:05 AM  CMP  Glucose 70 - 99 mg/dL 440  102    BUN 6 - 20 mg/dL 28  27    Creatinine 7.25 - 1.00 mg/dL 3.66  4.40    Sodium 347 - 145 mmol/L 130  131    Potassium 3.5 - 5.1 mmol/L 3.7   2.8  3.3   Chloride 98 - 111 mmol/L 85  82    CO2 22 - 32 mmol/L 29  31    Calcium 8.9 - 10.3 mg/dL 8.4  8.8      CXR: stable  _______________________________________________________________  Assessment and Plan: POD 11 s/p VSD repair  Neuro: pain controlled CV: appreciate heart failure team.  On plavix for stent Pulm: IS, ambulation.  On room air Renal:   creat up.  Will discuss holding diuresis with HF team GI: on diet Heme: on plavix.  On eliquis for felt repair ID: [email or web address] Endo: SSI Dispo: awaiting bed.  Dispo planning.  Continue PT/OT   [name] 05/22/2023 7:39 AM

## 2023-05-23 ENCOUNTER — Inpatient Hospital Stay (HOSPITAL_COMMUNITY): Payer: BC Managed Care – PPO

## 2023-05-23 DIAGNOSIS — I232 Ventricular septal defect as current complication following acute myocardial infarction: Secondary | ICD-10-CM | POA: Diagnosis not present

## 2023-05-23 DIAGNOSIS — J9601 Acute respiratory failure with hypoxia: Secondary | ICD-10-CM | POA: Diagnosis not present

## 2023-05-23 DIAGNOSIS — I2102 ST elevation (STEMI) myocardial infarction involving left anterior descending coronary artery: Secondary | ICD-10-CM | POA: Diagnosis not present

## 2023-05-23 LAB — URINALYSIS, ROUTINE W REFLEX MICROSCOPIC
Bilirubin Urine: NEGATIVE
Glucose, UA: NEGATIVE mg/dL
Hgb urine dipstick: NEGATIVE
Ketones, ur: NEGATIVE mg/dL
Leukocytes,Ua: NEGATIVE
Nitrite: NEGATIVE
Protein, ur: NEGATIVE mg/dL
Specific Gravity, Urine: 1.006 (ref 1.005–1.030)
pH: 6 (ref 5.0–8.0)

## 2023-05-23 LAB — BRAIN NATRIURETIC PEPTIDE: B Natriuretic Peptide: 917.9 pg/mL — ABNORMAL HIGH (ref 0.0–100.0)

## 2023-05-23 LAB — GLUCOSE, CAPILLARY
Glucose-Capillary: 129 mg/dL — ABNORMAL HIGH (ref 70–99)
Glucose-Capillary: 131 mg/dL — ABNORMAL HIGH (ref 70–99)
Glucose-Capillary: 101 mg/dL — ABNORMAL HIGH (ref 70–99)
Glucose-Capillary: 161 mg/dL — ABNORMAL HIGH (ref 70–99)

## 2023-05-23 LAB — SEDIMENTATION RATE: Sed Rate: 31 mm/hr — ABNORMAL HIGH (ref 0–22)

## 2023-05-23 LAB — C-REACTIVE PROTEIN: CRP: 11.4 mg/dL — ABNORMAL HIGH (ref <1.0)

## 2023-05-23 LAB — PROCALCITONIN: Procalcitonin: 0.15 ng/mL

## 2023-05-23 MED ORDER — VANCOMYCIN HCL 2000 MG/400ML IV SOLN
2000.0000 mg | Freq: Once | INTRAVENOUS | Status: AC
  Administered 2023-05-23: 2000 mg via INTRAVENOUS
  Filled 2023-05-23: qty 400

## 2023-05-23 MED ORDER — BUDESONIDE 0.25 MG/2ML IN SUSP
0.2500 mg | Freq: Two times a day (BID) | RESPIRATORY_TRACT | Status: DC
  Administered 2023-05-23 – 2023-05-27 (×9): 0.25 mg via RESPIRATORY_TRACT
  Filled 2023-05-23 (×9): qty 2

## 2023-05-23 MED ORDER — FUROSEMIDE 10 MG/ML IJ SOLN
60.0000 mg | Freq: Once | INTRAMUSCULAR | Status: AC
  Administered 2023-05-23: 60 mg via INTRAVENOUS
  Filled 2023-05-23: qty 6

## 2023-05-23 MED ORDER — VANCOMYCIN HCL 750 MG/150ML IV SOLN
750.0000 mg | Freq: Two times a day (BID) | INTRAVENOUS | Status: DC
  Administered 2023-05-24 – 2023-05-25 (×4): 750 mg via INTRAVENOUS
  Filled 2023-05-23 (×4): qty 150

## 2023-05-23 MED ORDER — SODIUM CHLORIDE 0.9 % IV SOLN
2.0000 g | Freq: Two times a day (BID) | INTRAVENOUS | Status: DC
  Administered 2023-05-23 – 2023-05-29 (×13): 2 g via INTRAVENOUS
  Filled 2023-05-23 (×13): qty 12.5

## 2023-05-23 MED ORDER — BUDESONIDE 0.25 MG/2ML IN SUSP: 0.2500 mg | Freq: Two times a day (BID) | RESPIRATORY_TRACT | Status: DC

## 2023-05-23 MED ORDER — LEVOFLOXACIN 750 MG PO TABS
750.0000 mg | ORAL_TABLET | Freq: Every day | ORAL | Status: DC
  Administered 2023-05-23: 750 mg via ORAL
  Filled 2023-05-23: qty 1

## 2023-05-23 MED FILL — Heparin Sodium (Porcine) Inj 1000 Unit/ML: INTRAMUSCULAR | Qty: 20 | Status: AC

## 2023-05-23 MED FILL — Mannitol IV Soln 20%: INTRAVENOUS | Qty: 500 | Status: AC

## 2023-05-23 MED FILL — Calcium Chloride Inj 10%: INTRAVENOUS | Qty: 10 | Status: AC

## 2023-05-23 MED FILL — Sodium Bicarbonate IV Soln 8.4%: INTRAVENOUS | Qty: 100 | Status: AC

## 2023-05-23 MED FILL — Sodium Chloride IV Soln 0.9%: INTRAVENOUS | Qty: 2000 | Status: AC

## 2023-05-23 MED FILL — Electrolyte-R (PH 7.4) Solution: INTRAVENOUS | Qty: 3000 | Status: AC

## 2023-05-23 NOTE — Progress Notes (Signed)
Pharmacy Antibiotic Note  [name] is a 57 y.o. female admitted on 05/04/2023 with STEMI> PCI, post op VSD repair.   WBC elevated, afebrile cough with yellow sputum est Cr 1.4.crcl 40ml/min Pharmacy has been consulted for vancomycin and cefepime  dosing.  Plan: Vancomycin 2gm x1 then 750mg  IV q12h Cefepime 2gm q12h   Height: 5\' 4"  (162.6 cm) Weight: 96.8 kg (213 lb 6.6 oz) IBW/kg (Calculated) : 54.7  Temp (24hrs), Avg:98.6 F (37 C), Min:98.4 F (36.9 C), Max:99 F (37.2 C)  Recent Labs  Lab 05/19/23 0425 05/20/23 0343 05/21/23 0120 05/21/23 0431 05/21/23 1717 05/22/23 0155 05/22/23 0909 05/23/23 0330  WBC 12.2* 12.9*  --  16.3*  --  17.8*  --  18.6*  CREATININE 1.36* 1.41* 1.55*  --  1.70* 1.78* 1.67* 1.43*    Estimated Creatinine Clearance: 49 mL/min (A) (by C-G formula based on SCr of 1.43 mg/dL (H)).    Allergies  Allergen Reactions   Ibuprofen Other (See Comments)    Pt is unable to take this due to kidney problems.      Penicillins Rash and Other (See Comments)    Tolerated ceftriaxone and cefepime in past Has patient had a PCN reaction causing immediate rash, facial/tongue/throat swelling, SOB or lightheadedness with hypotension: No Has patient had a PCN reaction causing severe rash involving mucus membranes or skin necrosis: No Has patient had a PCN reaction that required hospitalization No Has patient had a PCN reaction occurring within the last 10 years: No If all of the above answers are "NO", then may proceed with Cephalosporin use.    Antimicrobials this admission: Post op abx vancomycin + levofloxacin  Dose adjustments this admission:   Microbiology results:  7/18 MRSA pcr neg, staph areus +   Leota Sauers Pharm.D. CPP, BCPS Clinical Pharmacist [phone] 05/23/2023 12:40 PM

## 2023-05-23 NOTE — Plan of Care (Signed)
  Problem: Education: Goal: Knowledge of General Education information will improve Description: Including pain rating scale, medication(s)/side effects and non-pharmacologic comfort measures Outcome: Progressing   Problem: Health Behavior/Discharge Planning: Goal: Ability to manage health-related needs will improve Outcome: Progressing   Problem: Clinical Measurements: Goal: Ability to maintain clinical measurements within normal limits will improve Outcome: Progressing Goal: Will remain free from infection Outcome: Progressing Goal: Diagnostic test results will improve Outcome: Progressing Goal: Respiratory complications will improve Outcome: Progressing Goal: Cardiovascular complication will be avoided Outcome: Progressing   Problem: Activity: Goal: Risk for activity intolerance will decrease Outcome: Progressing   Problem: Nutrition: Goal: Adequate nutrition will be maintained Outcome: Progressing   Problem: Coping: Goal: Level of anxiety will decrease Outcome: Progressing   Problem: Elimination: Goal: Will not experience complications related to bowel motility Outcome: Progressing Goal: Will not experience complications related to urinary retention Outcome: Progressing   Problem: Pain Managment: Goal: General experience of comfort will improve Outcome: Progressing   Problem: Safety: Goal: Ability to remain free from injury will improve Outcome: Progressing   Problem: Skin Integrity: Goal: Risk for impaired skin integrity will decrease Outcome: Progressing   Problem: Education: Goal: Understanding of CV disease, CV risk reduction, and recovery process will improve Outcome: Progressing Goal: Individualized Educational Video(s) Outcome: Progressing   Problem: Activity: Goal: Ability to return to baseline activity level will improve Outcome: Progressing   Problem: Cardiovascular: Goal: Ability to achieve and maintain adequate cardiovascular perfusion  will improve Outcome: Progressing Goal: Vascular access site(s) Level 0-1 will be maintained Outcome: Progressing   Problem: Health Behavior/Discharge Planning: Goal: Ability to safely manage health-related needs after discharge will improve Outcome: Progressing   Problem: Education: Goal: Ability to describe self-care measures that may prevent or decrease complications (Diabetes Survival Skills Education) will improve Outcome: Progressing Goal: Individualized Educational Video(s) Outcome: Progressing   Problem: Coping: Goal: Ability to adjust to condition or change in health will improve Outcome: Progressing   Problem: Fluid Volume: Goal: Ability to maintain a balanced intake and output will improve Outcome: Progressing   Problem: Health Behavior/Discharge Planning: Goal: Ability to identify and utilize available resources and services will improve Outcome: Progressing Goal: Ability to manage health-related needs will improve Outcome: Progressing   Problem: Metabolic: Goal: Ability to maintain appropriate glucose levels will improve Outcome: Progressing   Problem: Nutritional: Goal: Maintenance of adequate nutrition will improve Outcome: Progressing Goal: Progress toward achieving an optimal weight will improve Outcome: Progressing   Problem: Skin Integrity: Goal: Risk for impaired skin integrity will decrease Outcome: Progressing   Problem: Tissue Perfusion: Goal: Adequacy of tissue perfusion will improve Outcome: Progressing   Problem: Education: Goal: Will demonstrate proper wound care and an understanding of methods to prevent future damage Outcome: Progressing Goal: Knowledge of disease or condition will improve Outcome: Progressing Goal: Knowledge of the prescribed therapeutic regimen will improve Outcome: Progressing Goal: Individualized Educational Video(s) Outcome: Progressing   Problem: Activity: Goal: Risk for activity intolerance will  decrease Outcome: Progressing   Problem: Cardiac: Goal: Will achieve and/or maintain hemodynamic stability Outcome: Progressing   Problem: Clinical Measurements: Goal: Postoperative complications will be avoided or minimized Outcome: Progressing   Problem: Respiratory: Goal: Respiratory status will improve Outcome: Progressing   Problem: Skin Integrity: Goal: Wound healing without signs and symptoms of infection Outcome: Progressing Goal: Risk for impaired skin integrity will decrease Outcome: Progressing   Problem: Urinary Elimination: Goal: Ability to achieve and maintain adequate renal perfusion and functioning will improve Outcome: Progressing

## 2023-05-23 NOTE — Progress Notes (Signed)
Inpatient Rehabilitation Admissions Coordinator   I met at bedside with patient to review cost of care for CIR with [employer] approval. I await medical readiness  to admit to CIR. She is in agreement.  Ottie Glazier, RN, MSN Rehab Admissions Coordinator [phone] 05/23/2023 5:07 PM

## 2023-05-23 NOTE — Progress Notes (Signed)
Occupational Therapy Treatment Patient Details Name: [name] MRN: [id number] DOB: 26-May-1966 Today's Date: 05/23/2023   History of present illness Pt is 57 year old presented to [facility] on  05/04/23 for STEMI and underwent PCI to LAD on 7/12. Pt with cardiogenic shock and acute heart failure. Pt continued with chest pain and found to have apical ventral septal defect. Pt underwent rt heart cath and IABP placed on 7/14. Underwent surgical ventral septal defect repair on 7/19. IABP removed and pt extubated 7/20. 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   OT comments  Pt progressing towards goals, needing set up - mod A for ADLs, min A for bed mobility, and min A for transfers with RW. Pt needing min cues for sternal precautions throughout. Able to complete bed level grooming task and therex at end of session. Pt presenting with impairments listed below, will follow acutely. Patient will benefit from intensive inpatient follow up therapy, >3 hours/day to maximize safety/ind with ADLs/functional mobility.    Recommendations for follow up therapy are one component of a multi-disciplinary discharge planning process, led by the attending physician.  Recommendations may be updated based on patient status, additional functional criteria and insurance authorization.    Assistance Recommended at Discharge Intermittent Supervision/Assistance  Patient can return home with the following  A little help with walking and/or transfers;A lot of help with bathing/dressing/bathroom;Assistance with cooking/housework;Assist for transportation;Help with stairs or ramp for entrance   Equipment Recommendations  Other (comment) (defer)    Recommendations for Other Services PT consult    Precautions / Restrictions  Precautions Precautions: Fall;Sternal Precaution Booklet Issued: Yes (comment) Precaution Comments: pt able to recall precautions Restrictions Weight Bearing Restrictions: No Other Position/Activity Restrictions: sternal prec       Mobility Bed Mobility Overal bed mobility: Needs Assistance Bed Mobility: Sit to Sidelying         Sit to sidelying: Min assist      Transfers Overall transfer level: Needs assistance Equipment used: Rolling walker (2 wheels) Transfers: Sit to/from Stand Sit to Stand: Min assist     Step pivot transfers: Min assist           Balance Overall balance assessment: Needs assistance Sitting-balance support: No upper extremity supported, Feet supported Sitting balance-Leahy Scale: Fair     Standing balance support: Bilateral upper extremity supported, During functional activity Standing balance-Leahy Scale: Poor Standing balance comment: UE support and +2 min assist                           ADL either performed or assessed with clinical judgement   ADL Overall ADL's : Needs assistance/impaired     Grooming: Oral care;Set up;Bed level                   Toilet Transfer: Minimal assistance;Ambulation;Regular Toilet;Rolling walker (2 wheels) Toilet Transfer Details (indicate cue type and reason): from [facility] to bed Toileting- Clothing Manipulation and Hygiene: Moderate assistance Toileting - Clothing Manipulation Details (indicate cue type and reason): max A posterior pericare, supervision for pulling up mesh underwear     Functional mobility during ADLs: Minimal assistance      Extremity/Trunk Assessment Upper Extremity Assessment Upper Extremity Assessment: Generalized weakness   Lower Extremity Assessment Lower Extremity Assessment: Defer to PT evaluation  Vision   Vision Assessment?: No apparent visual deficits   Perception Perception Perception: Not tested   Praxis Praxis Praxis: Not tested     Cognition Arousal/Alertness: Awake/alert Behavior During Therapy: Anxious Overall Cognitive Status: Within Functional Limits for tasks assessed                                          Exercises Other Exercises Other Exercises: seated shoulder forward flexion x10 BUE Other Exercises: seated external rotation of BUE with elbows in x10    Shoulder Instructions       General Comments VSS    Pertinent Vitals/ Pain       Pain Assessment Pain Assessment: Faces Pain Score: 4  Faces Pain Scale: Hurts little more Pain Location: chest incision Pain Descriptors / Indicators: Operative site guarding, Grimacing Pain Intervention(s): Limited activity within patient's tolerance, Monitored during session, Repositioned  Home Living                                          Prior Functioning/Environment              Frequency  Min 1X/week        Progress Toward Goals  OT Goals(current goals can now be found in the care plan section)  Progress towards OT goals: Progressing toward goals  Acute Rehab OT Goals Patient Stated Goal: none stated OT Goal Formulation: With patient Time For Goal Achievement:  Potential to Achieve Goals: Good ADL Goals Pt Will Perform Grooming: with supervision;sitting Pt Will Perform Upper Body Dressing: with min assist;sitting Pt Will Perform Lower Body Dressing: with mod assist;sit to/from stand;sitting/lateral leans Pt Will Transfer to Toilet: with min assist;squat pivot transfer;stand pivot transfer;bedside commode Additional ADL Goal #1: pt will complete bed mobility with min A in prep for ADLs Additional ADL Goal #2: Pt will be able to verbalize and adhere to sternal prec during 2 ADLs with min cues.  Plan Discharge plan remains appropriate;Frequency remains appropriate    Co-evaluation                 AM-PAC OT "6 Clicks" Daily Activity     Outcome Measure   Help from another person  eating meals?: A Little Help from another person taking care of personal grooming?: A Little Help from another person toileting, which includes using toliet, bedpan, or urinal?: A Lot Help from another person bathing (including washing, rinsing, drying)?: A Lot Help from another person to put on and taking off regular upper body clothing?: A Little Help from another person to put on and taking off regular lower body clothing?: A Little 6 Click Score: 16    End of Session    OT Visit Diagnosis: Unsteadiness on feet (R26.81);Other abnormalities of gait and mobility (R26.89);Muscle weakness (generalized) (M62.81)   Activity Tolerance     Patient Left     Nurse Communication          Time: 1308-6578 OT Time Calculation (min): 22 min  Charges: OT General Charges $OT Visit: 1 Visit OT Treatments $Self Care/Home Management : 8-22 mins  Carver Fila, OTD, OTR/L SecureChat Preferred Acute Rehab [phone]   Carver Fila Koonce 05/23/2023, 12:16 PM

## 2023-05-23 NOTE — Progress Notes (Signed)
PT Cancellation Note  Patient Details Name: [name] MRN: [id number] DOB: Jan 06, 1966   Cancelled Treatment:    Reason Eval/Treat Not Completed: Other (comment). Pt deferred OOB due to having an upset stomach and frequent episodes of diarrhea this morning. Pt did report working with OT this morning and sitting up for 3.5 hours already today. PT to return as able to progress activity tolerance.  [name], PT, DPT Acute Rehabilitation Services Secure chat preferred Office #: [phone]    Iona Hansen 05/23/2023, 11:40 AM

## 2023-05-23 NOTE — Plan of Care (Signed)
  Problem: Education: Goal: Knowledge of General Education information will improve Description: Including pain rating scale, medication(s)/side effects and non-pharmacologic comfort measures Outcome: Progressing   Problem: Health Behavior/Discharge Planning: Goal: Ability to manage health-related needs will improve Outcome: Progressing   Problem: Clinical Measurements: Goal: Ability to maintain clinical measurements within normal limits will improve Outcome: Progressing Goal: Will remain free from infection Outcome: Progressing Goal: Diagnostic test results will improve Outcome: Progressing Goal: Respiratory complications will improve Outcome: Progressing Goal: Cardiovascular complication will be avoided Outcome: Progressing   Problem: Activity: Goal: Risk for activity intolerance will decrease Outcome: Progressing   Problem: Nutrition: Goal: Adequate nutrition will be maintained Outcome: Progressing   Problem: Coping: Goal: Level of anxiety will decrease Outcome: Progressing   Problem: Elimination: Goal: Will not experience complications related to bowel motility Outcome: Progressing Goal: Will not experience complications related to urinary retention Outcome: Progressing   Problem: Pain Managment: Goal: General experience of comfort will improve Outcome: Progressing   Problem: Safety: Goal: Ability to remain free from injury will improve Outcome: Progressing   Problem: Skin Integrity: Goal: Risk for impaired skin integrity will decrease Outcome: Progressing   Problem: Education: Goal: Understanding of CV disease, CV risk reduction, and recovery process will improve Outcome: Progressing Goal: Individualized Educational Video(s) Outcome: Progressing   Problem: Activity: Goal: Ability to return to baseline activity level will improve Outcome: Progressing   Problem: Cardiovascular: Goal: Ability to achieve and maintain adequate cardiovascular perfusion  will improve Outcome: Progressing Goal: Vascular access site(s) Level 0-1 will be maintained Outcome: Progressing   Problem: Health Behavior/Discharge Planning: Goal: Ability to safely manage health-related needs after discharge will improve Outcome: Progressing   Problem: Education: Goal: Ability to describe self-care measures that may prevent or decrease complications (Diabetes Survival Skills Education) will improve Outcome: Progressing Goal: Individualized Educational Video(s) Outcome: Progressing   Problem: Coping: Goal: Ability to adjust to condition or change in health will improve Outcome: Progressing   Problem: Fluid Volume: Goal: Ability to maintain a balanced intake and output will improve Outcome: Progressing   Problem: Health Behavior/Discharge Planning: Goal: Ability to identify and utilize available resources and services will improve Outcome: Progressing Goal: Ability to manage health-related needs will improve Outcome: Progressing   Problem: Metabolic: Goal: Ability to maintain appropriate glucose levels will improve Outcome: Progressing   Problem: Nutritional: Goal: Maintenance of adequate nutrition will improve Outcome: Progressing Goal: Progress toward achieving an optimal weight will improve Outcome: Progressing   Problem: Skin Integrity: Goal: Risk for impaired skin integrity will decrease Outcome: Progressing   Problem: Tissue Perfusion: Goal: Adequacy of tissue perfusion will improve Outcome: Progressing   Problem: Education: Goal: Will demonstrate proper wound care and an understanding of methods to prevent future damage Outcome: Progressing Goal: Knowledge of disease or condition will improve Outcome: Progressing Goal: Knowledge of the prescribed therapeutic regimen will improve Outcome: Progressing Goal: Individualized Educational Video(s) Outcome: Progressing   Problem: Activity: Goal: Risk for activity intolerance will  decrease Outcome: Progressing   Problem: Cardiac: Goal: Will achieve and/or maintain hemodynamic stability Outcome: Progressing   Problem: Clinical Measurements: Goal: Postoperative complications will be avoided or minimized Outcome: Progressing   Problem: Respiratory: Goal: Respiratory status will improve Outcome: Progressing   Problem: Skin Integrity: Goal: Wound healing without signs and symptoms of infection Outcome: Progressing Goal: Risk for impaired skin integrity will decrease Outcome: Progressing

## 2023-05-23 NOTE — Progress Notes (Addendum)
Patient ID: [name], female   DOB: 21-Jun-1966, 57 y.o.   MRN: [id number]    Advanced Heart Failure Rounding Note   Subjective:    7/14: RHC mean RA 21, PA 50/25 mean 35, mean PCWP 28, CI 1.8 F/2.6 T, QpQs 3.3.  IABP placed.  7/19: Surgical repair of apical VSD.  7/20: IABP removed, extubated.   Post op day 12  Diuretics held yesterday given bump in SCr. Improved today 1.78>>1.43. Wt stable.   Felt SOB overnight. Required supp O2. Reports productive cough w/ "dark green" colored sputum. Felt "feverish" yesterday. Afebrile. WBC 11>>16>>18K.    Objective:     Vital Signs:   Temp:  [97.8 F (36.6 C)-99 F (37.2 C)] 99 F (37.2 C) (07/31 0335) Pulse Rate:  [65-79] 69 (07/31 0600) Resp:  [15-33] 21 (07/31 0600) BP: (90-128)/(60-99) 101/78 (07/31 0600) SpO2:  [89 %-100 %] 95 % (07/31 0600) Weight:  [96.8 kg] 96.8 kg (07/31 0600) Last BM Date : 05/22/23  Weight change: Filed Weights   05/21/23 0600 05/22/23 0541 05/23/23 0600  Weight: 97.2 kg 96.4 kg 96.8 kg    Intake/Output:   Intake/Output Summary (Last 24 hours) at 05/23/2023 0714 Last data filed at 05/22/2023 1400 Gross per 24 hour  Intake 307.72 ml  Output 700 ml  Net -392.28 ml     PHYSICAL EXAM: General:  fatigued appearing, mod obese, sitting up in chair. No respiratory difficulty HEENT: normal Neck: supple. JVD 8 cm. Carotids 2+ bilat; no bruits. No lymphadenopathy or thyromegaly appreciated. Cor: PMI nondisplaced. Regular rate & rhythm. No rubs, gallops or murmurs. Lungs: decreased BS at the bases  Abdomen: obese, soft, nontender, nondistended. No hepatosplenomegaly. No bruits or masses. Good bowel sounds. Extremities: no cyanosis, clubbing, rash, edema. S/p left TMT amputation, + wound on Rt foot (bandaged)  Neuro: alert & oriented x 3, cranial nerves grossly intact. moves all 4 extremities w/o difficulty. Affect pleasant.   Telemetry: Sinus rhythm 70s Personally reviewed  Labs: Basic Metabolic  Panel: Recent Labs  Lab 05/19/23 0425 05/20/23 0343 05/20/23 2034 05/21/23 0120 05/21/23 0605 05/21/23 1717 05/22/23 0155 05/22/23 0909 05/23/23 0330  NA 131* 131*  --  131*  --  131* 130* 128* 128*  K 3.6 2.6*   < > 2.9* 3.3* 2.8* 3.7 3.2* 4.8  CL 95* 88*  --  87*  --  82* 85* 84* 90*  CO2 25 27  --  31  --  31 29 29 25   GLUCOSE 106* 110*  --  111*  --  108* 128* 192* 148*  BUN 20 20  --  26*  --  27* 28* 29* 26*  CREATININE 1.36* 1.41*  --  1.55*  --  1.70* 1.78* 1.67* 1.43*  CALCIUM 8.0* 8.3*  --  8.4*  --  8.8* 8.4* 8.5* 8.6*  MG 2.2 1.9  --  2.2  --   --  1.8  --  2.9*   < > = values in this interval not displayed.    Liver Function Tests: No results for input(s): "AST", "ALT", "ALKPHOS", "BILITOT", "PROT", "ALBUMIN" in the last 168 hours.   No results for input(s): "LIPASE", "AMYLASE" in the last 168 hours. No results for input(s): "AMMONIA" in the last 168 hours.  CBC: Recent Labs  Lab 05/19/23 0425 05/20/23 0343 05/21/23 0431 05/22/23 0155 05/23/23 0330  WBC 12.2* 12.9* 16.3* 17.8* 18.6*  HGB 7.8* 8.4* 9.3* 9.3* 8.9*  HCT 25.3* 27.0* 30.1* 28.9* 28.4*  MCV 92.7  87.7 87.2 85.5 87.4  PLT 485* 579* 727* 714* 681*    Cardiac Enzymes: No results for input(s): "CKTOTAL", "CKMB", "CKMBINDEX", "TROPONINI" in the last 168 hours.  BNP: BNP (last 3 results) Recent Labs    05/04/23 0620  BNP 123.2*    ProBNP (last 3 results) No results for input(s): "PROBNP" in the last 8760 hours.    Other results:  Imaging: No results found.   Medications:     Scheduled Medications:  alteplase  2 mg Intracatheter Once   amiodarone  200 mg Oral BID   amitriptyline  50 mg Oral QHS   apixaban  5 mg Oral BID   bisacodyl  10 mg Oral BID   Or   bisacodyl  10 mg Rectal BID   Chlorhexidine Gluconate Cloth  6 each Topical Daily   chlorpheniramine-HYDROcodone  5 mL Oral BID   clopidogrel  75 mg Oral Daily   [facility], Patient & Family Education    Does not apply Once   docusate sodium  200 mg Oral BID   DULoxetine  60 mg Oral BID   feeding supplement  237 mL Oral BID BM   gabapentin  300 mg Oral BID   insulin aspart  0-9 Units Subcutaneous TID WC & HS   insulin glargine-yfgn  12 Units Subcutaneous BID   levothyroxine  25 mcg Oral Daily   linaclotide  145 mcg Oral QAC breakfast   mouth rinse  15 mL Mouth Rinse 4 times per day   pantoprazole  40 mg Oral Daily   polyethylene glycol  17 g Oral Daily   potassium chloride  60 mEq Oral TID   rosuvastatin  40 mg Oral Daily   sodium chloride flush  10-40 mL Intracatheter Q12H   spironolactone  25 mg Oral Daily    Infusions:    PRN Medications: dextrose, guaiFENesin, HYDROmorphone, ipratropium-albuterol, lidocaine, methocarbamol, ondansetron (ZOFRAN) IV, mouth rinse, mouth rinse, oxyCODONE, traMADol    Patient Profile    [name] is a 57 y.o. 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 Admitted with STEMI s/p PCI to LAD c/b cardiogenic shock and infarct related apical VSD.    Assessment/Plan:   Cardiogenic shock s/p VSD repair  - Echo 6/23 EF 55%, no RWMA, normal RV, trivial MR - Echo 7/12,  EF 45% - Echo 7/14: EF 60% with apical VSD, normal RV. Apical VSD creates large shunt by RHC QpQs 3.3.  - TEE 7/15 showed EF 55% with apical akinesis and apical VSD with significant L>R flow, RV mildly dilated and dysfunctional.   - OR 7/19 for surgical repair of apical VSD.  - Below pre-op weight. Holding Torsemide w AKI (improving). SOB overnight, required [place]. C/w hyponatremia. Check BNP today to help w/ assessment of volume status to guide restart of diuretics. Has PICC. Will set up CVP  - Continue spiro 25 mg daily  - No SGLT2i for now with risk of infection d/t limited mobility  Anterior STEMI, CAD - HsTrop >24K on admission - s/p PCI to LAD 7/12, post DES 0% residual  stenosis. Medical management of 40% stenosed RCA.  - LDL unable to be calculated with high TGD, continue Crestor 40.  - Complicated by infarct-related VSD. Now s/p repair  - On Plavix + apixaban + statin  DM II - A1c last 7.6 - SSI while admitted per primary - has diabetic foot ulcers.  Large wound on right side. Reconsult WOC. - ABI 12/22 were normal  4. PAF - in NSR on po amio - Continue Eliquis   5. Obesity - Body mass index is 37.76 kg/m.  - consider GLP-1 as OP   6. OSA - Using BIPAP at night  7. AKI - Improving  1.36>1.78>>1.43.  Holding diuretics for now - monitor   8. Hyponatremia - Na 128 - ?Hypervolemic hyponatremia. Check BNP  - Fluid restrict.   9. Anemia - 1 u RBCs on 07/20 - Hgb stable 8.9  - Transfuse Hgb < 7  10. Thrombocytopenia - IABP now out - Resolved  11. Acute hypoxemic respiratory failure - Now off BiPAP - O2 stable, now on RA - Encourage IS  12. Hypokalemia - resolved, K 4.8   13. Leukocytosis  - WBC 11>>16>>18K. Reports productive cough, green sputum, + subjective f/c. CXR w atelectasis vs PNA - will check PCT   Continue to mobilize w/ PT.    Robbie Lis PA-C 05/23/2023 7:14 AM

## 2023-05-23 NOTE — Progress Notes (Signed)
Mobility Specialist Progress Note:   05/23/23 1605  Therapy Vitals  Temp 98.6 F (37 C)  Temp Source Oral  Pulse Rate 69  Resp 19  BP 113/73  Patient Position (if appropriate) Lying  Oxygen Therapy  SpO2 92 %  Mobility  Activity Ambulated with assistance in room  Level of Assistance Moderate assist, patient does 50-74%  Assistive Device Front wheel walker  Distance Ambulated (ft) 15 ft  RLE Weight Bearing WBAT  Activity Response Tolerated well  Mobility Referral Yes  $Mobility charge 1 Mobility  Mobility Specialist Start Time (ACUTE ONLY) 1618  Mobility Specialist Stop Time (ACUTE ONLY) 1640  Mobility Specialist Time Calculation (min) (ACUTE ONLY) 22 min    Pre Mobility: 69 HR During Mobility: 80 HR Post Mobility:  77 HR  Pt received in bed, agreeable to mobility. C/o chest pain at incision site. ModA for bed mobility and ambulation. Asymptomatic throughout. Pt left in chair with call bell and all needs met.  [place] Earlene Plater Mobility Specialist Please contact via Special educational needs teacher or Rehab office at [phone]

## 2023-05-23 NOTE — Progress Notes (Signed)
NAME:  [name], MRN:  [id number], DOB:  [birth year], LOS: 19 ADMISSION DATE:  05/04/2023, CONSULTATION DATE: 05/11/2023 REFERRING MD: Dr. Cliffton Asters, CHIEF COMPLAINT: Chest pain  History of Present Illness:  57 year old female with a diabetes melitis, complicated with neuropathy and Wegener's granulomatosis who was initially admitted with acute anterior STEMI underwent PCI to LAD, who continues to require vasopressor support and IABP, echocardiogram showed mechanical complication including apical VSD, underwent VSD repair, remained intubated, on multiple vasopressor support, PCCM was consulted for help evaluation medical management  Pertinent  Medical History   Past Medical History:  Diagnosis Date   Chronic cough    Chronic pain    Diabetes mellitus without complication (HCC)    Dyspnea    GERD (gastroesophageal reflux disease)    Hypokalemia    Hypothyroidism    Left wrist pain 06/01/2021   Myonecrosis (HCC) 07/01/2021   Neurocardiogenic syncope    OSA (obstructive sleep apnea)    does not use CPAP   Osteomyelitis (HCC)    bilateral feet   Peripheral neuropathy    Presence of intrathecal pump    recieves Prialt/bupivicaine   Tear of gluteus medius tendon 07/01/2021   Wegener's granulomatosis 2009   Wegner's disease (congenital syphilitic osteochondritis)      Significant Hospital Events: Including procedures, antibiotic start and stop dates in addition to other pertinent events   Repair of small VSD following myocardial infarction.  Interim History / Subjective:  Increased shortness of breath overnight with cough productive sputum.  Increased wheezing.  Feels achy and subjectively feverish.  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  Had been on immunosuppressive therapy until March of this year.  It was stop due to good symptom control and evidence of  immunosuppression.  Objective   Blood pressure 137/87, pulse 73, temperature 98.4 F (36.9 C), temperature source Oral, resp. rate 19, height 5\' 4"  (1.626 m), weight 96.8 kg, SpO2 97%.        Intake/Output Summary (Last 24 hours) at 05/23/2023 1232 Last data filed at 05/22/2023 1400 Gross per 24 hour  Intake 173.56 ml  Output 250 ml  Net -76.44 ml   Filed Weights   05/21/23 0600 05/22/23 0541 05/23/23 0600  Weight: 97.2 kg 96.4 kg 96.8 kg    Examination: General: Middle-age morbidly obese female, lying on the bed HEENT: [place]/AT, eyes anicteric.  moist mucus membranes Neuro: Alert, awake following commands Chest: Chest is clear.  Looks more winded today. Heart: Regular rate and rhythm, no murmurs or gallops Abdomen: Soft, nontender, nondistended, bowel sounds present Skin: No rash  Ancillary tests personally reviewed:  Sodium 128, sodium 3.2.  Creatinine improving at 1 4 3  Uptrending leukocytosis 18.6.  Increased platelet count 681 (decreasing) Chest x-ray shows persistent bilateral infiltrates more prominent on the left may simply represent postoperative changes.  Assessment & Plan:  Coronary artery disease, presented with acute anterior STEMI, complicated with apical VSD. 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  Plan:  -Increasing shortness of breath and sputum production which could be consistent with either HCAP or GPA flare. -Cover with HCAP antibiotics.  Continue as needed  bronchodilators.  Add nebulized budesonide. -Have ordered MPO and ANCA titers. -Low threshold to CT  chest if symptoms persist.  Best Practice (right click and "Reselect all SmartList Selections" daily)   Diet/type: Regular consistency DVT prophylaxis: Apixaban GI prophylaxis: PPI Lines: Central line, yes still needed  Foley:  Yes, and it is still needed Code Status:  full code Last date of multidisciplinary goals of care discussion [Per primary team]  Lynnell Catalan, MD [facility] ICU Physician [facility] [place] [facility]  Pager: [phone] Or Epic Secure Chat After hours: [phone].  05/23/2023, 12:32 PM

## 2023-05-23 NOTE — Progress Notes (Addendum)
      [address]       [place] [address]             [phone]        12 Days Post-Op Procedure(s) (LRB): VENTRICULAR SEPTAL DEFECT (VSD) REPAIR (N/A) TRANSESOPHAGEAL ECHOCARDIOGRAM (N/A)  Subjective: Patient sitting in chair, eating breakfast this am. She states she had difficulty with her breathing last night, but improved this am.  Objective: Vital signs in last 24 hours: Temp:  [97.8 F (36.6 C)-99 F (37.2 C)] 99 F (37.2 C) (07/31 0335) Pulse Rate:  [65-79] 69 (07/31 0600) Cardiac Rhythm: Normal sinus rhythm (07/30 2000) Resp:  [14-33] 21 (07/31 0600) BP: (90-128)/(60-99) 101/78 (07/31 0600) SpO2:  [88 %-100 %] 95 % (07/31 0600) Weight:  [96.8 kg] 96.8 kg (07/31 0600)  Pre op weight 96.8 kg Current Weight  05/23/23 96.8 kg      Intake/Output from previous day: 07/30 0701 - 07/31 0700 In: 307.7 [IV Piggyback:307.7] Out: 700 [Urine:700]   Physical Exam:  Cardiovascular: RRR, no murmur Pulmonary: Slightly diminished bibasilar breath sounds L>R Abdomen: Soft, non tender, bowel sounds present. Extremities: Trace LE edema. Bilateral TMA.  Wound: Sternal wound is clean and dry.  No erythema or signs of infection. Dressing on right TMA.  Lab Results: CBC: Recent Labs    05/22/23 0155 05/23/23 0330  WBC 17.8* 18.6*  HGB 9.3* 8.9*  HCT 28.9* 28.4*  PLT 714* 681*   BMET:  Recent Labs    05/22/23 0909 05/23/23 0330  NA 128* 128*  K 3.2* 4.8  CL 84* 90*  CO2 29 25  GLUCOSE 192* 148*  BUN 29* 26*  CREATININE 1.67* 1.43*  CALCIUM 8.5* 8.6*    PT/INR:  Lab Results  Component Value Date   INR 1.5 (H) 05/11/2023   INR 1.0 05/04/2023   INR 1.8 (H) 04/17/2022   ABG:  INR: Will add last result for INR, ABG once components are confirmed Will add last 4 CBG results once components are confirmed  Assessment/Plan:  1. CV - S/p anterior STEMI, cardiogenic shock, PAF- SR, first degree heart block. On Amiodarone 200 mg bid,  Spironolactone 25 mg daily, Apixaban 5 mg bid, and Plavix 75 mg daily. 2.  Pulmonary - History of OSA-does not use CPAP;on bi pap at night. On room air. Encourage incentive spirometer 3.  Expected post op acute blood loss anemia - H and H this am slightly decreased to 8.9 and 28.4 4. DM-CBGs 146/178/129. On Insulin. Pre op HGA1C 7.6 5. Hypervolemic hyponatremia-has had post op. Sodium this am remains 128. 6. AKI-Creatinine decreased from 1.67 to 1.43. 7. Thrombocytosis -platelets this am decreased to 681,000. 8. Leukocytosis-WBC this am slightly to 18,600. She remains afebrile. ? Etiology. She has right PICC and right TMA wound. 9. History of hypothyroidism-continue Levothyroxine 10. Right foot wound-wound care following 11. Deconditioned-continue PT/OT. Will need CIR when ready for discharge  Donielle M ZimmermanPA-C 6:56 AM   Agree with above Doing well Continue PT/OT Will watch WBCs Dispo planning  Miley Blanchett O Zikeria Keough

## 2023-05-24 ENCOUNTER — Inpatient Hospital Stay (HOSPITAL_COMMUNITY): Payer: BC Managed Care – PPO

## 2023-05-24 DIAGNOSIS — I2102 ST elevation (STEMI) myocardial infarction involving left anterior descending coronary artery: Secondary | ICD-10-CM | POA: Diagnosis not present

## 2023-05-24 DIAGNOSIS — I5023 Acute on chronic systolic (congestive) heart failure: Secondary | ICD-10-CM | POA: Diagnosis not present

## 2023-05-24 DIAGNOSIS — J9601 Acute respiratory failure with hypoxia: Secondary | ICD-10-CM | POA: Diagnosis not present

## 2023-05-24 DIAGNOSIS — Z95811 Presence of heart assist device: Secondary | ICD-10-CM | POA: Diagnosis not present

## 2023-05-24 DIAGNOSIS — Z1152 Encounter for screening for COVID-19: Secondary | ICD-10-CM | POA: Diagnosis not present

## 2023-05-24 DIAGNOSIS — I2109 ST elevation (STEMI) myocardial infarction involving other coronary artery of anterior wall: Secondary | ICD-10-CM | POA: Diagnosis not present

## 2023-05-24 LAB — GLUCOSE, CAPILLARY
Glucose-Capillary: 123 mg/dL — ABNORMAL HIGH (ref 70–99)
Glucose-Capillary: 146 mg/dL — ABNORMAL HIGH (ref 70–99)
Glucose-Capillary: 126 mg/dL — ABNORMAL HIGH (ref 70–99)
Glucose-Capillary: 267 mg/dL — ABNORMAL HIGH (ref 70–99)

## 2023-05-24 LAB — ECHOCARDIOGRAM LIMITED
Area-P 1/2: 4.68 cm2
Height: 64 in
S' Lateral: 2.6 cm
Weight: 3410.96 oz

## 2023-05-24 LAB — TROPONIN I (HIGH SENSITIVITY)
Troponin I (High Sensitivity): 197 ng/L (ref <18)
Troponin I (High Sensitivity): 130 ng/L (ref <18)

## 2023-05-24 MED ORDER — ACETAMINOPHEN 325 MG PO TABS: 650.0000 mg | ORAL_TABLET | Freq: Four times a day (QID) | ORAL | Status: DC | PRN

## 2023-05-24 MED ORDER — ALBUMIN HUMAN 5 % IV SOLN
12.5000 g | Freq: Once | INTRAVENOUS | Status: AC
  Administered 2023-05-24: 12.5 g via INTRAVENOUS
  Filled 2023-05-24: qty 250

## 2023-05-24 MED ORDER — METHYLPREDNISOLONE SODIUM SUCC 40 MG IJ SOLR
40.0000 mg | Freq: Once | INTRAMUSCULAR | Status: AC
  Administered 2023-05-24: 40 mg via INTRAVENOUS
  Filled 2023-05-24: qty 1

## 2023-05-24 MED ORDER — GUAIFENESIN ER 600 MG PO TB12
1200.0000 mg | ORAL_TABLET | Freq: Two times a day (BID) | ORAL | Status: DC
Start: 2023-05-24 — End: 2023-05-24

## 2023-05-24 MED ORDER — IOHEXOL 350 MG/ML SOLN
75.0000 mL | Freq: Once | INTRAVENOUS | Status: AC | PRN
  Administered 2023-05-24: 75 mL via INTRAVENOUS

## 2023-05-24 MED ORDER — BISACODYL 5 MG PO TBEC: 10.0000 mg | DELAYED_RELEASE_TABLET | Freq: Two times a day (BID) | ORAL | Status: DC | PRN

## 2023-05-24 MED ORDER — NITROGLYCERIN 0.4 MG SL SUBL
0.4000 mg | SUBLINGUAL_TABLET | SUBLINGUAL | Status: DC | PRN
  Administered 2023-05-24: 0.4 mg via SUBLINGUAL
  Filled 2023-05-24: qty 1

## 2023-05-24 MED ORDER — DOCUSATE SODIUM 100 MG PO CAPS
200.0000 mg | ORAL_CAPSULE | Freq: Two times a day (BID) | ORAL | Status: DC | PRN
  Administered 2023-05-26: 200 mg via ORAL
  Filled 2023-05-24: qty 2

## 2023-05-24 MED ORDER — POTASSIUM CHLORIDE CRYS ER 20 MEQ PO TBCR
20.0000 meq | EXTENDED_RELEASE_TABLET | Freq: Every day | ORAL | Status: DC
  Administered 2023-05-24 – 2023-05-25 (×2): 20 meq via ORAL
  Filled 2023-05-24 (×2): qty 1

## 2023-05-24 MED ORDER — PREDNISONE 20 MG PO TABS
80.0000 mg | ORAL_TABLET | Freq: Every day | ORAL | Status: DC
  Administered 2023-05-25: 80 mg via ORAL
  Filled 2023-05-24: qty 4

## 2023-05-24 MED ORDER — FUROSEMIDE 40 MG PO TABS
40.0000 mg | ORAL_TABLET | Freq: Every day | ORAL | Status: DC
  Administered 2023-05-24 – 2023-05-29 (×6): 40 mg via ORAL
  Filled 2023-05-24 (×6): qty 1

## 2023-05-24 NOTE — Progress Notes (Signed)
  Echocardiogram 2D Echocardiogram has been performed.  Delcie Roch 05/24/2023, 1:53 PM

## 2023-05-24 NOTE — Progress Notes (Signed)
Mobility Specialist Progress Note   05/24/23 0925  Mobility  Activity Transferred to/from [facility]  Level of Assistance Contact guard assist, steadying assist  Assistive Device Front wheel walker  Distance Ambulated (ft) 5 ft  Range of Motion/Exercises Active;All extremities  RLE Weight Bearing WBAT  Activity Response Tolerated well   Patient's father stopped Clinical research associate in hallway requesting assistance. Received patient on [facility] requesting assistance for pericare. Reviewed sternal precautions with good adherence and carryover into session. Required min A to stand and balance from [facility], also needed TA for pericare. Was able to take minimal steps to recliner with min G and needed extensive time to recover while dangling EOB. Noted to be significantly SOB but SpO2 >90% on RA. Needed mod A to transition from sit > supine and for bed mobility. Tolerated without complaint or incident. Was left in supine with all needs met, call bell in reach.   [place] Tewana Bohlen, BS EXP Mobility Specialist Please contact via SecureChat or Rehab office at [phone]

## 2023-05-24 NOTE — Progress Notes (Signed)
PT Cancellation Note  Patient Details Name: [name] MRN: [id number] DOB: 1966/03/28   Cancelled Treatment:    Reason Eval/Treat Not Completed: (P) Pain limiting ability to participate (Pt receiving pain medicine and asks therapist to come back later. Will check back as schedule allows.)   Johny Shock 05/24/2023, 4:17 PM

## 2023-05-24 NOTE — Progress Notes (Signed)
      [address]       [place] [address]             [phone]      I was called about this patient by the nurse after she had called rapid response. She states the patient started feeling worse, having worse cold sweats and complaining of increased pain. On arrival the patient is shaking in the bed and looks to be in acute distress. She complains of 9/10 chest pain that radiates to her left shoulder blade. It had originally started in the left upper quadrant of her abdomen and travelled to her chest and back. EKG showed a NSR with 1st degree heart block, with no sign of acute ischemia. She was given nitroglycerin for the chest pain, but she continued to complain of 7/10 pain. She also reports diarrhea throughout the day today.   Vital signs: BP 147/115 when pain started, BP 122/76 on arrival, and BP 89/61 after nitroglycerin was given HR 60s-70s, O2 saturation 98-100% on 2L [place], Temp 98.9, RR 17-23  PE:  General: Pt in acute distress, lying in bed with eyes closed and shaking Neuro: Grossly intact, no acute deficit CV: Regular rate and rhythm, no murmur Pulm: Diminished breath sounds at bases GI: Tenderness to deep palpation at LUQ, no distension, no rebound tenderness Wounds: Sternal wound is clean and dry without sign of infection, right foot with clean and dry dressing in place  Plan: As discussed with Dr. Leafy Ro will give albumin and get a CT chest, abdomen, and pelvis with contrast. A troponin level was also ordered.   Jenny Reichmann, PA-C

## 2023-05-24 NOTE — Progress Notes (Signed)
Patient ID: [name], female   DOB: 1966/09/30, 57 y.o.   MRN: [id number]    Advanced Heart Failure Rounding Note   Subjective:    7/14: RHC mean RA 21, PA 50/25 mean 35, mean PCWP 28, CI 1.8 F/2.6 T, QpQs 3.3.  IABP placed.  7/19: Surgical repair of apical VSD.  7/20: IABP removed, extubated.   Post op day 13  Feels a little bit better this morning but felt SOB overnight. C/w productive cough. Now on empiric abx for possible HCAP. No other complaints.   CVP 4-5. SCr stable, 1.4.  Objective:     Vital Signs:   Temp:  [98.1 F (36.7 C)-98.8 F (37.1 C)] 98.1 F (36.7 C) (08/01 0257) Pulse Rate:  [68-75] 75 (08/01 0257) Resp:  [15-24] 24 (08/01 0257) BP: (99-137)/(59-87) 112/66 (08/01 0257) SpO2:  [92 %-99 %] 97 % (08/01 0257) Weight:  [96.7 kg] 96.7 kg (08/01 0600) Last BM Date : 05/23/23  Weight change: Filed Weights   05/22/23 0541 05/23/23 0600 05/24/23 0600  Weight: 96.4 kg 96.8 kg 96.7 kg    Intake/Output:   Intake/Output Summary (Last 24 hours) at 05/24/2023 0715 Last data filed at 05/24/2023 0626 Gross per 24 hour  Intake 1350 ml  Output 1800 ml  Net -450 ml     PHYSICAL EXAM: CVP 4-5  General:  Well appearing, mod obese. No respiratory difficulty HEENT: normal Neck: supple. no JVD. Carotids 2+ bilat; no bruits. No lymphadenopathy or thyromegaly appreciated. Cor: PMI nondisplaced. Regular rate & rhythm. No rubs, gallops or murmurs. Lungs: clear Abdomen: obese, soft, nontender, nondistended. No hepatosplenomegaly. No bruits or masses. Good bowel sounds. Extremities: no cyanosis, clubbing, rash, edema, b/l TMT amputations  Neuro: alert & oriented x 3, cranial nerves grossly intact. moves all 4 extremities w/o difficulty. Affect pleasant.    Telemetry: NSR 70s Personally reviewed  Labs: Basic Metabolic Panel: Recent Labs  Lab 05/20/23 0343 05/20/23 2034 05/21/23 0120 05/21/23 0605 05/21/23 1717 05/22/23 0155 05/22/23 0909 05/23/23 0330  05/24/23 0300  NA 131*  --  131*  --  131* 130* 128* 128* 131*  K 2.6*   < > 2.9*   < > 2.8* 3.7 3.2* 4.8 3.7  CL 88*  --  87*  --  82* 85* 84* 90* 91*  CO2 27  --  31  --  31 29 29 25 25   GLUCOSE 110*  --  111*  --  108* 128* 192* 148* 145*  BUN 20  --  26*  --  27* 28* 29* 26* 23*  CREATININE 1.41*  --  1.55*  --  1.70* 1.78* 1.67* 1.43* 1.42*  CALCIUM 8.3*  --  8.4*  --  8.8* 8.4* 8.5* 8.6* 8.3*  MG 1.9  --  2.2  --   --  1.8  --  2.9* 2.1   < > = values in this interval not displayed.    Liver Function Tests: No results for input(s): "AST", "ALT", "ALKPHOS", "BILITOT", "PROT", "ALBUMIN" in the last 168 hours.   No results for input(s): "LIPASE", "AMYLASE" in the last 168 hours. No results for input(s): "AMMONIA" in the last 168 hours.  CBC: Recent Labs  Lab 05/20/23 0343 05/21/23 0431 05/22/23 0155 05/23/23 0330 05/24/23 0300  WBC 12.9* 16.3* 17.8* 18.6* 18.7*  HGB 8.4* 9.3* 9.3* 8.9* 8.6*  HCT 27.0* 30.1* 28.9* 28.4* 28.0*  MCV 87.7 87.2 85.5 87.4 87.8  PLT 579* 727* 714* 681* 710*    Cardiac Enzymes:  No results for input(s): "CKTOTAL", "CKMB", "CKMBINDEX", "TROPONINI" in the last 168 hours.  BNP: BNP (last 3 results) Recent Labs    05/04/23 0620 05/23/23 0330  BNP 123.2* 917.9*    ProBNP (last 3 results) No results for input(s): "PROBNP" in the last 8760 hours.    Other results:  Imaging: DG CHEST PORT 1 VIEW  Result Date: 05/23/2023 CLINICAL DATA:  Pleural effusion, chest pain EXAM: PORTABLE CHEST 1 VIEW COMPARISON:  Prior chest x-ray 05/21/2023 FINDINGS: Stable cardiomegaly. Mediastinal contours are unchanged. Left upper extremity PICC in stable position with the tip at the cavoatrial junction. Mild vascular congestion without edema. Interval resolution of left-sided pleural effusion and associated basilar airspace opacity. There may be a small persistent right-sided pleural effusion with associated atelectasis. No pneumothorax. IMPRESSION: 1.  Improved left basilar airspace opacity likely reflecting resolved pleural effusion and atelectasis. 2. Persistent mild blunting of the right costophrenic angle. Difficult to exclude a small right-sided pleural effusion and associated atelectasis. 3. Well-positioned left upper extremity PICC. 4. Cardiomegaly and pulmonary vascular congestion without edema. Electronically Signed   By: Malachy Moan M.D.   On: 05/23/2023 13:17     Medications:     Scheduled Medications:  alteplase  2 mg Intracatheter Once   amiodarone  200 mg Oral BID   amitriptyline  50 mg Oral QHS   apixaban  5 mg Oral BID   bisacodyl  10 mg Oral BID   Or   bisacodyl  10 mg Rectal BID   budesonide (PULMICORT) nebulizer solution  0.25 mg Nebulization BID   Chlorhexidine Gluconate Cloth  6 each Topical Daily   chlorpheniramine-HYDROcodone  5 mL Oral BID   clopidogrel  75 mg Oral Daily   [facility] Patient [facility] Family Education   Does not apply Once   docusate sodium  200 mg Oral BID   DULoxetine  60 mg Oral BID   feeding supplement  237 mL Oral BID BM   gabapentin  300 mg Oral BID   insulin aspart  0-9 Units Subcutaneous TID WC & HS   insulin glargine-yfgn  12 Units Subcutaneous BID   levothyroxine  25 mcg Oral Daily   linaclotide  145 mcg Oral QAC breakfast   mouth rinse  15 mL Mouth Rinse 4 times per day   pantoprazole  40 mg Oral Daily   polyethylene glycol  17 g Oral Daily   rosuvastatin  40 mg Oral Daily   sodium chloride flush  10-40 mL Intracatheter Q12H   spironolactone  25 mg Oral Daily    Infusions:  ceFEPime (MAXIPIME) IV Stopped (05/23/23 2205)   vancomycin Stopped (05/24/23 0441)     PRN Medications: dextrose, guaiFENesin, HYDROmorphone, ipratropium-albuterol, lidocaine, methocarbamol, ondansetron (ZOFRAN) IV, mouth rinse, mouth rinse, oxyCODONE, traMADol    Patient Profile    [name] is a 57 y.o. female with osteomyelitis, neurocardiogenic syncope , wegener's disease,  chronic pain, DVT and PE 6/23 s/p IVC filter + eliquis, obesity and diabetes. Admitted with STEMI s/p PCI to LAD c/b cardiogenic shock and infarct related apical VSD.    Assessment/Plan:   Cardiogenic shock s/p VSD repair  - Echo 6/23 EF 55%, no RWMA, normal RV, trivial MR - Echo 7/12,  EF 45% - Echo 7/14: EF 60% with apical VSD, normal RV. Apical VSD creates large shunt by RHC QpQs 3.3.  - TEE 7/15 showed EF 55% with apical akinesis and apical VSD with significant L>R flow, RV mildly dilated and dysfunctional.   -  OR 7/19 for surgical repair of apical VSD.  - Below pre-op weight. CVP 4-5 - Continue spiro 25 mg daily  - No SGLT2i for now with risk of infection d/t limited mobility - PO Lasix 40 mg daily   Anterior STEMI, CAD - HsTrop >24K on admission - s/p PCI to LAD 7/12, post DES 0% residual stenosis. Medical management of 40% stenosed RCA.  - LDL unable to be calculated with high TGD, continue Crestor 40.  - Complicated by infarct-related VSD. Now s/p repair  - On Plavix + apixaban + statin  DM II - A1c last 7.6 - SSI while admitted per primary - has diabetic foot ulcers. Large wound on right side. Reconsult WOC. - ABI 12/22 were normal  4. PAF - in NSR on po amio - Continue Eliquis   5. Obesity - Body mass index is 37.76 kg/m.  - consider GLP-1 as OP   6. OSA - Using BIPAP at night  7. AKI - Improving  1.36>1.78>>1.43>>1.42.  Holding diuretics for now - monitor   8. Hyponatremia - Na 128 - ?Hypervolemic hyponatremia. Check BNP  - Fluid restrict.   9. Anemia - 1 u RBCs on 07/20 - Hgb stable 8.9  - Transfuse Hgb < 7  10. Thrombocytopenia - IABP now out - Resolved  11. Acute hypoxemic respiratory failure - Now off BiPAP - O2 stable, now on RA - Encourage IS  12. Hypokalemia - resolved, K 4.8   13. Leukocytosis / ? HCAP  - WBC 11>>16>>18K. Reports productive cough, green sputum, + subjective f/c. CXR w atelectasis vs PNA. PCT 0.15.  - started on  Levaquin 750 mg daily 7/31 - PCCM following    Continue to mobilize w/ PT. Plan to d/c to CIR once medically stable   Mahlon Gabrielle PA-C 05/24/2023 7:15 AM

## 2023-05-24 NOTE — Progress Notes (Addendum)
      [address]       [place] [address]             [phone]        13 Days Post-Op Procedure(s) (LRB): VENTRICULAR SEPTAL DEFECT (VSD) REPAIR (N/A) TRANSESOPHAGEAL ECHOCARDIOGRAM (N/A)  Subjective: Patient states it sometimes hard to expectorate sputum. She would "like to stay ahead of her cough". She thinks her breathing is a little better this am.  Objective: Vital signs in last 24 hours: Temp:  [98.1 F (36.7 C)-98.8 F (37.1 C)] 98.1 F (36.7 C) (08/01 0257) Pulse Rate:  [68-75] 75 (08/01 0257) Cardiac Rhythm: Normal sinus rhythm (07/31 1950) Resp:  [15-24] 24 (08/01 0257) BP: (99-137)/(59-87) 112/66 (08/01 0257) SpO2:  [92 %-99 %] 97 % (08/01 0257) Weight:  [96.7 kg] 96.7 kg (08/01 0600)  Pre op weight 96.8 kg Current Weight  05/24/23 96.7 kg   CVP:  [6 mmHg-8 mmHg] 6 mmHg  Intake/Output from previous day: 07/31 0701 - 08/01 0700 In: 1350 [P.O.:600; IV Piggyback:750] Out: 1800 [Urine:1800]   Physical Exam:  Cardiovascular: RRR, no murmur Pulmonary: Slightly diminished bibasilar breath sounds  Abdomen: Soft, non tender, bowel sounds present. Extremities: Trace LE edema. Bilateral TMA.  Wound: Sternal wound is clean and dry.  No erythema or signs of infection. Dressing on right TMA.  Lab Results: CBC: Recent Labs    05/23/23 0330 05/24/23 0300  WBC 18.6* 18.7*  HGB 8.9* 8.6*  HCT 28.4* 28.0*  PLT 681* 710*   BMET:  Recent Labs    05/23/23 0330 05/24/23 0300  NA 128* 131*  K 4.8 3.7  CL 90* 91*  CO2 25 25  GLUCOSE 148* 145*  BUN 26* 23*  CREATININE 1.43* 1.42*  CALCIUM 8.6* 8.3*    PT/INR:  Lab Results  Component Value Date   INR 1.5 (H) 05/11/2023   INR 1.0 05/04/2023   INR 1.8 (H) 04/17/2022   ABG:  INR: Will add last result for INR, ABG once components are confirmed Will add last 4 CBG results once components are confirmed  Assessment/Plan:  1. CV - S/p anterior STEMI, cardiogenic shock, PAF- SR,  first degree heart block. On Amiodarone 200 mg bid,  Apixaban 5 mg bid, and Plavix 75 mg daily. 2.  Pulmonary - History of OSA-does not use CPAP;on bi pap at night. On room air. She on Robitussin PRN. CXR this am appears to show small right pleural effusion/atelectasis, cardiomegaly. Encourage incentive spirometer 3.  Expected post op acute blood loss anemia - H and H this am slightly decreased to 8.6 and 28 4. DM-CBGs 101/161/123. On Insulin. Pre op HGA1C 7.6 5. Hypervolemic hyponatremia-has had post op. Sodium this am remains 128. 6. AKI-Creatinine this am 1.42 7. Thrombocytosis -platelets this am decreased to 710,000. 8. ID-on Maxipime and Vancomycin. Leukocytosis-WBC this am remains 18,700. She remains afebrile, pro calcitonin 0.15. Per pulmonary, possible  Increasing shortness of breath and sputum production which could be consistent with either HCAP or GPA flare No sternal wound infection, UA negative, CXR negative. She has right PICC and right TMA wound. 9. History of hypothyroidism-continue Levothyroxine 10. Right foot wound-wound care following 11. Deconditioned-continue PT/OT. Will need CIR when ready for discharge  Donielle M ZimmermanPA-C 6:57 AM   Agree with above

## 2023-05-24 NOTE — Significant Event (Addendum)
Rapid Response Event Note   Reason for Call :  Chest pain  Initial Focused Assessment:  Patient in bed with eyes closed, responds to voice, A&O x3. Lungs clear/diminished. Heart tones normal. Skin warm, slightly clammy. No edema present. Noted chest incision with dressing CDI and bilateral toes amputated. Patient states CP started while she was in the chair approximately one hour prior with epigastric pain and CP 7/10, worsened after in the bed to now 9/10. Remains 9/10 mid sternal chest pain upon entry. Patient with non rhythmic jerks, not consistent with seizure activity.   147/115 initially, 108/91 (99) HR 68 RR 19 O2 100% 2L [place] Temp 97.8, rectal 98.9 CBG 126  At 1609 patient states pain radiating to back/left shoulder, SL nitroglycerin given. Pain improved to 7/10, BP dropped to 89/61.   Interventions/Plan of Care:  EKG grossly unchanged from previous (7/20) Nitroglycerin SL x1 PA to bedside Albumin  CT chest/abd/pelv with contrast Labs  Event Summary:  MD Notified: B. Stehler PA Call Time: 1553 Arrival Time: 1557 End Time: 1640  Truddie Crumble, RN

## 2023-05-24 NOTE — Plan of Care (Signed)
  Problem: Education: Goal: Knowledge of General Education information will improve Description: Including pain rating scale, medication(s)/side effects and non-pharmacologic comfort measures Outcome: Progressing   Problem: Health Behavior/Discharge Planning: Goal: Ability to manage health-related needs will improve Outcome: Progressing   Problem: Clinical Measurements: Goal: Ability to maintain clinical measurements within normal limits will improve Outcome: Progressing Goal: Will remain free from infection Outcome: Progressing Goal: Diagnostic test results will improve Outcome: Progressing Goal: Respiratory complications will improve Outcome: Progressing Goal: Cardiovascular complication will be avoided Outcome: Progressing   Problem: Activity: Goal: Risk for activity intolerance will decrease Outcome: Progressing   Problem: Nutrition: Goal: Adequate nutrition will be maintained Outcome: Progressing   Problem: Coping: Goal: Level of anxiety will decrease Outcome: Progressing   Problem: Elimination: Goal: Will not experience complications related to bowel motility Outcome: Progressing Goal: Will not experience complications related to urinary retention Outcome: Progressing   Problem: Pain Managment: Goal: General experience of comfort will improve Outcome: Progressing   Problem: Safety: Goal: Ability to remain free from injury will improve Outcome: Progressing   Problem: Skin Integrity: Goal: Risk for impaired skin integrity will decrease Outcome: Progressing   Problem: Education: Goal: Understanding of CV disease, CV risk reduction, and recovery process will improve Outcome: Progressing Goal: Individualized Educational Video(s) Outcome: Progressing   Problem: Activity: Goal: Ability to return to baseline activity level will improve Outcome: Progressing   Problem: Cardiovascular: Goal: Ability to achieve and maintain adequate cardiovascular perfusion  will improve Outcome: Progressing Goal: Vascular access site(s) Level 0-1 will be maintained Outcome: Progressing   Problem: Health Behavior/Discharge Planning: Goal: Ability to safely manage health-related needs after discharge will improve Outcome: Progressing   Problem: Education: Goal: Ability to describe self-care measures that may prevent or decrease complications (Diabetes Survival Skills Education) will improve Outcome: Progressing Goal: Individualized Educational Video(s) Outcome: Progressing   Problem: Coping: Goal: Ability to adjust to condition or change in health will improve Outcome: Progressing   Problem: Fluid Volume: Goal: Ability to maintain a balanced intake and output will improve Outcome: Progressing   Problem: Health Behavior/Discharge Planning: Goal: Ability to identify and utilize available resources and services will improve Outcome: Progressing Goal: Ability to manage health-related needs will improve Outcome: Progressing   Problem: Metabolic: Goal: Ability to maintain appropriate glucose levels will improve Outcome: Progressing   Problem: Nutritional: Goal: Maintenance of adequate nutrition will improve Outcome: Progressing Goal: Progress toward achieving an optimal weight will improve Outcome: Progressing

## 2023-05-24 NOTE — Progress Notes (Signed)
Critical Troponin   Value 197  Notified [facility] PA No new chest pain. Vitals stable

## 2023-05-24 NOTE — Progress Notes (Signed)
Patient called nurse and stated she did not feel well. She stated having 9/10 chest/abd pain. Vitals 161/140, 97.13F, 67, 23. Pt also having non rhythmic jerks. Paged Rapid Response and Cardiothoracic PA. Both at bedside. Stat CT chest/abd placed. New orders see MAR.

## 2023-05-24 DEATH — deceased

## 2023-05-25 DIAGNOSIS — I2102 ST elevation (STEMI) myocardial infarction involving left anterior descending coronary artery: Secondary | ICD-10-CM | POA: Diagnosis not present

## 2023-05-25 LAB — GLUCOSE, CAPILLARY
Glucose-Capillary: 94 mg/dL (ref 70–99)
Glucose-Capillary: 108 mg/dL — ABNORMAL HIGH (ref 70–99)
Glucose-Capillary: 146 mg/dL — ABNORMAL HIGH (ref 70–99)
Glucose-Capillary: 358 mg/dL — ABNORMAL HIGH (ref 70–99)
Glucose-Capillary: 369 mg/dL — ABNORMAL HIGH (ref 70–99)
Glucose-Capillary: 258 mg/dL — ABNORMAL HIGH (ref 70–99)

## 2023-05-25 LAB — APTT: aPTT: 55 seconds — ABNORMAL HIGH (ref 24–36)

## 2023-05-25 LAB — VANCOMYCIN, TROUGH: Vancomycin Tr: 23 ug/mL (ref 15–20)

## 2023-05-25 MED ORDER — PREDNISONE 20 MG PO TABS
40.0000 mg | ORAL_TABLET | Freq: Every day | ORAL | Status: DC
  Administered 2023-05-26 – 2023-05-27 (×2): 40 mg via ORAL
  Filled 2023-05-25 (×2): qty 2

## 2023-05-25 MED ORDER — VANCOMYCIN HCL 500 MG/100ML IV SOLN
500.0000 mg | Freq: Two times a day (BID) | INTRAVENOUS | Status: DC
  Administered 2023-05-25 – 2023-05-29 (×8): 500 mg via INTRAVENOUS
  Filled 2023-05-25 (×10): qty 100

## 2023-05-25 MED ORDER — VANCOMYCIN HCL 500 MG/100ML IV SOLN
500.0000 mg | Freq: Two times a day (BID) | INTRAVENOUS | Status: DC
  Filled 2023-05-25: qty 100

## 2023-05-25 MED ORDER — CLOPIDOGREL BISULFATE 75 MG PO TABS
75.0000 mg | ORAL_TABLET | Freq: Every day | ORAL | Status: DC
  Administered 2023-05-25 – 2023-05-29 (×5): 75 mg via ORAL
  Filled 2023-05-25 (×5): qty 1

## 2023-05-25 MED ORDER — HEPARIN (PORCINE) 25000 UT/250ML-% IV SOLN
1200.0000 [IU]/h | INTRAVENOUS | Status: DC
  Administered 2023-05-25: 1000 [IU]/h via INTRAVENOUS
  Administered 2023-05-26 – 2023-05-27 (×2): 1200 [IU]/h via INTRAVENOUS
  Filled 2023-05-25 (×3): qty 250

## 2023-05-25 NOTE — Progress Notes (Signed)
ANTICOAGULATION CONSULT NOTE-Follow Up  Pharmacy Consult for apixaban > heparin  Indication: Afib/ s/p VSD repair  Allergies  Allergen Reactions   Ibuprofen Other (See Comments)    Pt is unable to take this due to kidney problems.      Penicillins Rash and Other (See Comments)    Tolerated ceftriaxone and cefepime in past Has patient had a PCN reaction causing immediate rash, facial/tongue/throat swelling, SOB or lightheadedness with hypotension: No Has patient had a PCN reaction causing severe rash involving mucus membranes or skin necrosis: No Has patient had a PCN reaction that required hospitalization No Has patient had a PCN reaction occurring within the last 10 years: No If all of the above answers are "NO", then may proceed with Cephalosporin use.    Patient Measurements: Height: 5\' 4"  (162.6 cm) Weight: 96.3 kg (212 lb 4.9 oz) IBW/kg (Calculated) : 54.7 HEPARIN DW (KG): 77.8    Vital Signs: Temp: 98.1 F (36.7 C) (08/02 1113) Temp Source: Oral (08/02 1113) BP: 118/87 (08/02 1113) Pulse Rate: 68 (08/02 1113)  Labs: Recent Labs    05/23/23 0330 05/24/23 0300 05/24/23 1641 05/24/23 1855 05/25/23 0320  HGB 8.9* 8.6*  --   --  8.0*  HCT 28.4* 28.0*  --   --  25.9*  PLT 681* 710*  --   --  666*  CREATININE 1.43* 1.42*  --   --  1.22*  TROPONINIHS  --   --  197* 130*  --     Estimated Creatinine Clearance: 57.3 mL/min (A) (by C-G formula based on SCr of 1.22 mg/dL (H)).   Medical History: Past Medical History:  Diagnosis Date   Chronic cough    Chronic pain    Diabetes mellitus without complication (HCC)    Dyspnea    GERD (gastroesophageal reflux disease)    Hypokalemia    Hypothyroidism    Left wrist pain 06/01/2021   Myonecrosis (HCC) 07/01/2021   Neurocardiogenic syncope    OSA (obstructive sleep apnea)    does not use CPAP   Osteomyelitis (HCC)    bilateral feet   Peripheral neuropathy    Presence of intrathecal pump    recieves  Prialt/bupivicaine   Tear of gluteus medius tendon 07/01/2021   Wegener's granulomatosis 2009   Wegner's disease (congenital syphilitic osteochondritis)     Medications:   ceFEPime (MAXIPIME) IV 2 g (05/25/23 1002)   heparin     vancomycin 750 mg (05/25/23 1538)     Assessment: 57 yo female s/p DES to LAD 7/12, s/p IABP >  VSD repair and PAF in SR on amiodarone  May need to return to OR next week with sternal wound dehisced  Patient has been on apixaban - last dose 8/1 am  Will begin heparin drip  Monitor via aptt with falsely elevated heparin level from apixaban CBC stable.  Goal of Therapy:  Heparin level 0.2-0.5 units/ml Monitor platelets by anticoagulation protocol: Yes   Plan:  Heparin drip 1000 uts/hr  Aptt 6hr from start and daily aptt and cbc    Leota Sauers Pharm.D. CPP, BCPS Clinical Pharmacist [phone] 05/25/2023 3:51 PM   [facility] pharmacy phone numbers are listed on [email or web address]

## 2023-05-25 NOTE — Progress Notes (Signed)
Patient ID: [name], female   DOB: 1965-12-26, 57 y.o.   MRN: [id number]    Advanced Heart Failure Rounding Note   Subjective:    7/14: RHC mean RA 21, PA 50/25 mean 35, mean PCWP 28, CI 1.8 F/2.6 T, QpQs 3.3.  IABP placed.  7/19: Surgical repair of apical VSD.  7/20: IABP removed, extubated.   Post op day 14. Now w/ Dehisced sternum. On Maxipime and Vancomycin. WBCs downtrending. Eliquis now on hold w/ plans for OR next week for I&D.   C/w some mild sternal discomfort but able to rest better overnight.    Objective:     Vital Signs:   Temp:  [97.6 F (36.4 C)-98.9 F (37.2 C)] 97.6 F (36.4 C) (08/02 0734) Pulse Rate:  [64-84] 64 (08/02 0734) Resp:  [16-24] 16 (08/02 0734) BP: (89-161)/(51-140) 108/51 (08/02 0734) SpO2:  [94 %-100 %] 99 % (08/02 0734) Last BM Date : 05/24/23  Weight change: Filed Weights   05/22/23 0541 05/23/23 0600 05/24/23 0600  Weight: 96.4 kg 96.8 kg 96.7 kg    Intake/Output:   Intake/Output Summary (Last 24 hours) at 05/25/2023 0923 Last data filed at 05/25/2023 0618 Gross per 24 hour  Intake 500 ml  Output 1000 ml  Net -500 ml     PHYSICAL EXAM: General:  fatigued appearing. No respiratory difficulty HEENT: normal Neck: supple. no JVD. Carotids 2+ bilat; no bruits. No lymphadenopathy or thyromegaly appreciated. Cor: PMI nondisplaced. Regular rate & rhythm. No rubs, gallops or murmurs. Lungs: clear Abdomen: soft, nontender, nondistended. No hepatosplenomegaly. No bruits or masses. Good bowel sounds. Extremities: no cyanosis, clubbing, rash, edema Neuro: alert & oriented x 3, cranial nerves grossly intact. moves all 4 extremities w/o difficulty. Affect pleasant.   Telemetry: NSR w/ 1st deg AVG, 65 bpm Personally reviewed  Labs: Basic Metabolic Panel: Recent Labs  Lab 05/21/23 0120 05/21/23 0605 05/22/23 0155 05/22/23 0909 05/23/23 0330 05/24/23 0300 05/25/23 0320  NA 131*   < > 130* 128* 128* 131* 132*  K 2.9*   < > 3.7  3.2* 4.8 3.7 3.6  CL 87*   < > 85* 84* 90* 91* 93*  CO2 31   < > 29 29 25 25 25   GLUCOSE 111*   < > 128* 192* 148* 145* 103*  BUN 26*   < > 28* 29* 26* 23* 23*  CREATININE 1.55*   < > 1.78* 1.67* 1.43* 1.42* 1.22*  CALCIUM 8.4*   < > 8.4* 8.5* 8.6* 8.3* 8.4*  MG 2.2  --  1.8  --  2.9* 2.1 2.3   < > = values in this interval not displayed.    Liver Function Tests: No results for input(s): "AST", "ALT", "ALKPHOS", "BILITOT", "PROT", "ALBUMIN" in the last 168 hours.   No results for input(s): "LIPASE", "AMYLASE" in the last 168 hours. No results for input(s): "AMMONIA" in the last 168 hours.  CBC: Recent Labs  Lab 05/21/23 0431 05/22/23 0155 05/23/23 0330 05/24/23 0300 05/25/23 0320  WBC 16.3* 17.8* 18.6* 18.7* 15.1*  HGB 9.3* 9.3* 8.9* 8.6* 8.0*  HCT 30.1* 28.9* 28.4* 28.0* 25.9*  MCV 87.2 85.5 87.4 87.8 88.1  PLT 727* 714* 681* 710* 666*    Cardiac Enzymes: No results for input(s): "CKTOTAL", "CKMB", "CKMBINDEX", "TROPONINI" in the last 168 hours.  BNP: BNP (last 3 results) Recent Labs    05/15/2023 0620 05/23/23 0330  BNP 123.2* 917.9*    ProBNP (last 3 results) No results for input(s): "PROBNP" in  the last 8760 hours.    Other results:  Imaging: CT CHEST ABDOMEN PELVIS W CONTRAST  Result Date: 05/24/2023 CLINICAL DATA:  Sepsis, VSD repair EXAM: CT CHEST, ABDOMEN, AND PELVIS WITH CONTRAST TECHNIQUE: Multidetector CT imaging of the chest, abdomen and pelvis was performed following the standard protocol during bolus administration of intravenous contrast. RADIATION DOSE REDUCTION: This exam was performed according to the departmental dose-optimization program which includes automated exposure control, adjustment of the mA and/or kV according to patient size and/or use of iterative reconstruction technique. CONTRAST:  75mL OMNIPAQUE IOHEXOL 350 MG/ML SOLN COMPARISON:  05/20/2023, 05/24/2023 FINDINGS: CT CHEST FINDINGS Cardiovascular: Radiopaque patch involving the  distal margin of the interventricular septum extends into the pericardium, stable since prior study. Right-sided pericardial fluid is again identified measuring up to 17 mm in thickness, slightly increased since prior study where it had measured up to 15 mm in thickness. This fluid is contiguous with fluid along the inferior margin of the sternotomy and within the subcutaneous tissues, where there is a 3.0 x 2.0 cm fluid collection image 43/3. Fluid extends through the subcutaneous fat along the tracks of presumed prior indwelling surgical drains. There is mild rim enhancement of the pericardial fluid, but no rim enhancement of the subcutaneous fluid collection. Underlying infection cannot be excluded. No evidence of thoracic aortic aneurysm or dissection. Stable atherosclerosis. Left-sided central venous catheter tip within the superior vena cava. Mediastinum/Nodes: Postsurgical changes from median sternotomy. There is fat stranding in the anterior mediastinum and subcutaneous tissues consistent with recent surgical intervention. Please see discussion above regarding pericardial and subcutaneous fluid collections. Thyroid, trachea, and esophagus are unremarkable. No pathologic adenopathy. Lungs/Pleura: Trace bilateral pleural effusions with dependent atelectasis. No airspace disease or pneumothorax. Central airways are patent. Musculoskeletal: No acute displaced fracture. Reconstructed images demonstrate no additional findings. CT ABDOMEN PELVIS FINDINGS Hepatobiliary: No focal liver abnormality is seen. Status post cholecystectomy. No biliary dilatation. Pancreas: Unremarkable. No pancreatic ductal dilatation or surrounding inflammatory changes. Spleen: Stable splenic scarring.  No focal parenchymal abnormality. Adrenals/Urinary Tract: Adrenal glands are unremarkable. Kidneys are normal, without renal calculi, focal lesion, or hydronephrosis. Bladder is unremarkable. Stomach/Bowel: No bowel obstruction or ileus.  Diverticulosis of the distal colon without evidence of diverticulitis. No bowel wall thickening or inflammatory change. Vascular/Lymphatic: IVC filter. Stable atherosclerosis of the aorta. No pathologic adenopathy. Reproductive: Status post hysterectomy. No adnexal masses. Other: No free fluid or free intraperitoneal gas. No abdominal wall hernia. Nerve stimulator is identified within the left lower quadrant anterior abdominal wall, lead entering the thecal sac at the L2 level and extending cephalad terminating at the T10 level unchanged. Musculoskeletal: No acute or destructive bony abnormalities. Reconstructed images demonstrate no additional findings. IMPRESSION: 1. Postsurgical changes from median sternotomy and VSD repair as above. 2. Slight increase in the right pericardial fluid collection, which extends into the inferior margin of the sternotomy and subcutaneous tissues as above. Fluid extending through the subcutaneous tissues along the tracks of prior surgical drains. There is slight rim enhancement of the pericardial portion of the fluid collection, and superimposed infection cannot be excluded. 3. Trace bilateral pleural effusions. 4. No acute intra-abdominal or intrapelvic process. 5. Distal colonic diverticulosis without diverticulitis. 6.  Aortic Atherosclerosis (ICD10-I70.0). Electronically Signed   By: Sharlet Salina M.D.   On: 05/24/2023 17:45   ECHOCARDIOGRAM LIMITED  Result Date: 05/24/2023    ECHOCARDIOGRAM LIMITED REPORT   Patient Name:   ILYA ESS Date of Exam: 05/24/2023 Medical Rec #:  [id number]  Height:       64.0 in Accession #:    [id number]  Weight:       213.2 lb Date of Birth:  03/19/1966   BSA:          2.010 m Patient Age:    57 years    BP:           115/67 mmHg Patient Gender: F           HR:           76 bpm. Exam Location:  Inpatient Procedure: Limited Echo, Cardiac Doppler and Color Doppler Indications:    Status post ventricul  History:        Patient has prior history of  Echocardiogram examinations, most                 recent 05/07/2023. STEMI, surgical VSD closure, chronic kidney                 disease, Arrythmias:WPW; Risk Factors:Sleep Apnea.  Sonographer:    Delcie Roch RDCS Referring Phys: [phone] ADITYA SABHARWAL  Sonographer Comments: Patient is obese and Technically difficult study due to poor echo windows. Image acquisition challenging due to patient body habitus. IMPRESSIONS  1. Technically limited images, but no clear residual VSD flow seen . Left ventricular ejection fraction, by estimation, is 60 to 65%. The left ventricle has normal function. The left ventricle demonstrates regional wall motion abnormalities (see scoring  diagram/findings for description). There is akinesis of the left ventricular, apical apical segment and septal wall.  2. The inferior vena cava is normal in size with greater than 50% respiratory variability, suggesting right atrial pressure of 3 mmHg. FINDINGS  Left Ventricle: Technically limited images, but no clear residual VSD flow seen. Left ventricular ejection fraction, by estimation, is 60 to 65%. The left ventricle has normal function. The left ventricle demonstrates regional wall motion abnormalities. Pericardium: There is no evidence of pericardial effusion. Venous: The inferior vena cava is normal in size with greater than 50% respiratory variability, suggesting right atrial pressure of 3 mmHg. Additional Comments: Spectral Doppler performed. Color Doppler performed.  LEFT VENTRICLE PLAX 2D LVIDd:         3.90 cm LVIDs:         2.60 cm LV IVS:        0.90 cm  IVC IVC diam: 1.80 cm LEFT ATRIUM         Index LA diam:    3.70 cm 1.84 cm/m   AORTA Ao Asc diam: 3.30 cm MITRAL VALVE MV Area (PHT): 4.68 cm MV Decel Time: 162 msec MV E velocity: 87.00 cm/s MV A velocity: 71.30 cm/s MV E/A ratio:  1.22 Jodelle Red MD Electronically signed by Jodelle Red MD Signature Date/Time: 05/24/2023/4:55:53 PM    Final    DG Chest  Port 1 View  Result Date: 05/24/2023 CLINICAL DATA:  [id number] CHF following cardiac surgery, postop [id number] EXAM: PORTABLE CHEST - 1 VIEW COMPARISON:  05/23/2023 FINDINGS: Lordotic projection. The mediastinal contours are within normal limits. Unchanged cardiomegaly. Similar appearance of left upper extremity PICC with the catheter tip near the cavoatrial junction. Low lung volumes. The lungs are otherwise clear bilaterally without evidence of focal consolidation, pleural effusion, or pneumothorax. No acute osseous abnormality. Similar appearance of median sternotomy wires. IMPRESSION: 1. Examination is limited by lordotic positioning. 2. Low lung volumes. No acute cardiopulmonary process. 3. Unchanged cardiomegaly. Electronically Signed   By: Jae Dire.D.  On: 05/24/2023 08:11     Medications:     Scheduled Medications:  alteplase  2 mg Intracatheter Once   amiodarone  200 mg Oral BID   amitriptyline  50 mg Oral QHS   budesonide (PULMICORT) nebulizer solution  0.25 mg Nebulization BID   Chlorhexidine Gluconate Cloth  6 each Topical Daily   chlorpheniramine-HYDROcodone  5 mL Oral BID   clopidogrel  75 mg Oral Daily   [facility], Patient & Family Education   Does not apply Once   DULoxetine  60 mg Oral BID   feeding supplement  237 mL Oral BID BM   furosemide  40 mg Oral Daily   gabapentin  300 mg Oral BID   insulin aspart  0-9 Units Subcutaneous TID WC & HS   insulin glargine-yfgn  12 Units Subcutaneous BID   levothyroxine  25 mcg Oral Daily   mouth rinse  15 mL Mouth Rinse 4 times per day   pantoprazole  40 mg Oral Daily   potassium chloride  20 mEq Oral Daily   [START ON 05/26/2023] predniSONE  40 mg Oral Q breakfast   rosuvastatin  40 mg Oral Daily   sodium chloride flush  10-40 mL Intracatheter Q12H   spironolactone  25 mg Oral Daily    Infusions:  ceFEPime (MAXIPIME) IV Stopped (05/24/23 2212)   vancomycin Stopped (05/25/23 0417)     PRN  Medications: acetaminophen, bisacodyl **OR** [DISCONTINUED] bisacodyl, dextrose, docusate sodium, guaiFENesin, HYDROmorphone, ipratropium-albuterol, lidocaine, methocarbamol, nitroGLYCERIN, ondansetron (ZOFRAN) IV, mouth rinse, mouth rinse, oxyCODONE, traMADol    Patient Profile    Shaughnessy Gethers is a 57 y.o. 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 Admitted with STEMI s/p PCI to LAD c/b cardiogenic shock and infarct related apical VSD. S/p VAD repair. Post op course c/b sternal dehiscence.    Assessment/Plan:   Cardiogenic shock s/p VSD repair  - Echo 6/23 EF 55%, no RWMA, normal RV, trivial MR - Echo 7/12,  EF 45% - Echo 7/14: EF 60% with apical VSD, normal RV. Apical VSD creates large shunt by RHC QpQs 3.3.  - TEE 7/15 showed EF 55% with apical akinesis and apical VSD with significant L>R flow, RV mildly dilated and dysfunctional.   - OR 7/19 for surgical repair of apical VSD.  - Volume stable, continue PO Lasix 40 mg daily  - Continue spiro 25 mg daily  - No SGLT2i for now with risk of infection d/t limited mobility  Anterior STEMI, CAD - HsTrop >24K on admission - s/p PCI to LAD 7/12, post DES 0% residual stenosis. Medical management of 40% stenosed RCA.  - LDL unable to be calculated with high TGD, continue Crestor 40.  - Complicated by infarct-related VSD. Now s/p repair  - On Plavix + statin  DM II - A1c last 7.6 - SSI while admitted per primary - has diabetic foot ulcers. Large wound on right side. Reconsult WOC. - ABI 12/22 were normal  4. PAF - in NSR on po amio - Holding Eliquis for planned I&D for Dehisced sternum next wk    5. Obesity - Body mass index is 37.76 kg/m.  - consider GLP-1 as OP   6. OSA - Using BIPAP at night  7. AKI - Improving  1.36>1.78>>1.43>>1.42>>1.22.  Holding diuretics for now - monitor   8. Hyponatremia - Na 132 - Fluid  restrict.   9. Anemia -  1 u RBCs on 07/20 - Hgb stable 8.0 - Transfuse Hgb < 7  10. Thrombocytopenia - IABP now out - Resolved  11. Acute hypoxemic respiratory failure - Now off BiPAP - O2 stable, now on RA - Encourage IS  12. Hypokalemia - resolved, K 3.6 - monitor   13. Dehisced Sternum - noted on CT of chest 8/1 - On Maxipime and Vancomycin - Holding Eliquis. Plan OR next wk for I&D  14. Leukocytosis - improving w/ abx, in setting of Dehisced Sternum +/- HCAP  - continue abx     Robbie Lis PA-C 05/25/2023 9:23 AM

## 2023-05-25 NOTE — H&P (View-Only) (Signed)
[address]       [place] [address]             [phone]      14 Days Post-Op  Procedure(s) (LRB): VENTRICULAR SEPTAL DEFECT (VSD) REPAIR (N/A) TRANSESOPHAGEAL ECHOCARDIOGRAM (N/A)   Total Length of Stay:  LOS: 21 days    SUBJECTIVE: Was able to sleep some last night Discomfort not disabling at this point [facility] pending  Vitals:   05/25/23 0307 05/25/23 0734  BP: (!) 148/108 (!) 108/51  Pulse: 84 64  Resp: (!) 21 16  Temp: 97.9 F (36.6 C) 97.6 F (36.4 C)  SpO2: 94% 99%    Intake/Output      08/01 0701 08/02 0700 08/02 0701 08/03 0700   P.O.     IV Piggyback 500    Total Intake(mL/kg) 500 (5.2)    Urine (mL/kg/hr) 1000 (0.4)    Stool     Total Output 1000    Net -500         Urine Occurrence 1 x        ceFEPime (MAXIPIME) IV Stopped (05/24/23 2212)   vancomycin Stopped (05/25/23 0417)    CBC    Component Value Date/Time   WBC 15.1 (H) 05/25/2023 0320   RBC 2.94 (L) 05/25/2023 0320   HGB 8.0 (L) 05/25/2023 0320   HGB 12.7 01/04/2023 1312   HCT 25.9 (L) 05/25/2023 0320   HCT 39.6 01/04/2023 1312   PLT 666 (H) 05/25/2023 0320   PLT 269 01/04/2023 1312   MCV 88.1 05/25/2023 0320   MCV 93 01/04/2023 1312   MCH 27.2 05/25/2023 0320   MCHC 30.9 05/25/2023 0320   RDW 15.3 05/25/2023 0320   RDW 14.3 01/04/2023 1312   LYMPHSABS 2.8 05/13/2023 0908   LYMPHSABS 4.2 (H) 01/04/2023 1312   MONOABS 1.8 (H) 05/13/2023 0908   EOSABS 0.3 05/13/2023 0908   EOSABS 0.4 01/04/2023 1312   BASOSABS 0.1 05/13/2023 0908   BASOSABS 0.1 01/04/2023 1312   CMP     Component Value Date/Time   NA 132 (L) 05/25/2023 0320   NA 141 01/04/2023 1312   K 3.6 05/25/2023 0320   CL 93 (L) 05/25/2023 0320   CO2 25 05/25/2023 0320   GLUCOSE 103 (H) 05/25/2023 0320   BUN 23 (H) 05/25/2023 0320   BUN 14 01/04/2023 1312   CREATININE 1.22 (H) 05/25/2023 0320   CREATININE 1.07 (H) 07/01/2021 0909   CALCIUM 8.4 (L) 05/25/2023 0320   PROT 4.3 (L)  05/11/2023 1936   PROT 5.9 (L) 01/04/2023 1312   ALBUMIN 2.8 (L) 05/11/2023 1936   ALBUMIN 4.1 01/04/2023 1312   AST 51 (H) 05/11/2023 1936   ALT 15 05/11/2023 1936   ALKPHOS 64 05/11/2023 1936   BILITOT 0.5 05/11/2023 1936   BILITOT 0.3 01/04/2023 1312   GFRNONAA 52 (L) 05/25/2023 0320   GFRAA 30 (L) 01/12/2017 1208   ABG    Component Value Date/Time   PHART 7.417 05/14/2023 0955   PCO2ART 49.4 (H) 05/14/2023 0955   PO2ART 116 (H) 05/14/2023 0955   HCO3 32.0 (H) 05/14/2023 0955   TCO2 33 (H) 05/14/2023 0955   ACIDBASEDEF 3.0 (H) 05/13/2023 2259   O2SAT 57.3 05/20/2023 0343   CBG (last 3)  Recent Labs    05/24/23 2136 05/25/23 0313 05/25/23 0614  GLUCAP 267* 94 108*     ASSESSMENT: Dehisced sternum: appears to have occurred about 5 days ago by CXR, will hold  just eliquis and leave on plavix(fresh PCI stent) and if bc remain negative, to explore sternum early in the week.  Pt may ambulate as much as tolerated Will need to move back to ICU after debridement   Eugenio Hoes, MD 05/25/2023

## 2023-05-25 NOTE — Progress Notes (Signed)
Inpatient Rehabilitation Admissions Coordinator   Noted Plans for surgery next week. We will follow.  Ottie Glazier, RN, MSN Rehab Admissions Coordinator [phone] 05/25/2023 8:37 AM

## 2023-05-25 NOTE — Progress Notes (Signed)
[address]       [place] [address]             [phone]      14 Days Post-Op  Procedure(s) (LRB): VENTRICULAR SEPTAL DEFECT (VSD) REPAIR (N/A) TRANSESOPHAGEAL ECHOCARDIOGRAM (N/A)   Total Length of Stay:  LOS: 21 days    SUBJECTIVE: Was able to sleep some last night Discomfort not disabling at this point [facility] pending  Vitals:   05/25/23 0307 05/25/23 0734  BP: (!) 148/108 (!) 108/51  Pulse: 84 64  Resp: (!) 21 16  Temp: 97.9 F (36.6 C) 97.6 F (36.4 C)  SpO2: 94% 99%    Intake/Output      08/01 0701 08/02 0700 08/02 0701 08/03 0700   P.O.     IV Piggyback 500    Total Intake(mL/kg) 500 (5.2)    Urine (mL/kg/hr) 1000 (0.4)    Stool     Total Output 1000    Net -500         Urine Occurrence 1 x        ceFEPime (MAXIPIME) IV Stopped (05/24/23 2212)   vancomycin Stopped (05/25/23 0417)    CBC    Component Value Date/Time   WBC 15.1 (H) 05/25/2023 0320   RBC 2.94 (L) 05/25/2023 0320   HGB 8.0 (L) 05/25/2023 0320   HGB 12.7 01/04/2023 1312   HCT 25.9 (L) 05/25/2023 0320   HCT 39.6 01/04/2023 1312   PLT 666 (H) 05/25/2023 0320   PLT 269 01/04/2023 1312   MCV 88.1 05/25/2023 0320   MCV 93 01/04/2023 1312   MCH 27.2 05/25/2023 0320   MCHC 30.9 05/25/2023 0320   RDW 15.3 05/25/2023 0320   RDW 14.3 01/04/2023 1312   LYMPHSABS 2.8 05/13/2023 0908   LYMPHSABS 4.2 (H) 01/04/2023 1312   MONOABS 1.8 (H) 05/13/2023 0908   EOSABS 0.3 05/13/2023 0908   EOSABS 0.4 01/04/2023 1312   BASOSABS 0.1 05/13/2023 0908   BASOSABS 0.1 01/04/2023 1312   CMP     Component Value Date/Time   NA 132 (L) 05/25/2023 0320   NA 141 01/04/2023 1312   K 3.6 05/25/2023 0320   CL 93 (L) 05/25/2023 0320   CO2 25 05/25/2023 0320   GLUCOSE 103 (H) 05/25/2023 0320   BUN 23 (H) 05/25/2023 0320   BUN 14 01/04/2023 1312   CREATININE 1.22 (H) 05/25/2023 0320   CREATININE 1.07 (H) 07/01/2021 0909   CALCIUM 8.4 (L) 05/25/2023 0320   PROT 4.3 (L)  05/11/2023 1936   PROT 5.9 (L) 01/04/2023 1312   ALBUMIN 2.8 (L) 05/11/2023 1936   ALBUMIN 4.1 01/04/2023 1312   AST 51 (H) 05/11/2023 1936   ALT 15 05/11/2023 1936   ALKPHOS 64 05/11/2023 1936   BILITOT 0.5 05/11/2023 1936   BILITOT 0.3 01/04/2023 1312   GFRNONAA 52 (L) 05/25/2023 0320   GFRAA 30 (L) 01/12/2017 1208   ABG    Component Value Date/Time   PHART 7.417 05/14/2023 0955   PCO2ART 49.4 (H) 05/14/2023 0955   PO2ART 116 (H) 05/14/2023 0955   HCO3 32.0 (H) 05/14/2023 0955   TCO2 33 (H) 05/14/2023 0955   ACIDBASEDEF 3.0 (H) 05/13/2023 2259   O2SAT 57.3 05/20/2023 0343   CBG (last 3)  Recent Labs    05/24/23 2136 05/25/23 0313 05/25/23 0614  GLUCAP 267* 94 108*     ASSESSMENT: Dehisced sternum: appears to have occurred about 5 days ago by CXR, will hold  just eliquis and leave on plavix(fresh PCI stent) and if bc remain negative, to explore sternum early in the week.  Pt may ambulate as much as tolerated Will need to move back to ICU after debridement   Eugenio Hoes, MD 05/25/2023

## 2023-05-25 NOTE — Progress Notes (Signed)
PT Cancellation Note  Patient Details Name: [name] MRN: [id number] DOB: 11-30-1965   Cancelled Treatment:    Reason Eval/Treat Not Completed: Patient declined, no reason specified Per RN, pt just receiving pain meds and wanting to take a nap. PT will continue to f/u with pt acutely as available and appropriate.   Alessandra Bevels  05/25/2023, 10:08 AM

## 2023-05-25 NOTE — Progress Notes (Addendum)
[address]       [place] [address]             [phone]        14 Days Post-Op Procedure(s) (LRB): VENTRICULAR SEPTAL DEFECT (VSD) REPAIR (N/A) TRANSESOPHAGEAL ECHOCARDIOGRAM (N/A)  Subjective: Patient lying in bed. She has some sternal pain, but not like yesterday.  Objective: Vital signs in last 24 hours: Temp:  [97.6 F (36.4 C)-98.9 F (37.2 C)] 97.6 F (36.4 C) (08/02 0734) Pulse Rate:  [64-84] 64 (08/02 0734) Cardiac Rhythm: Normal sinus rhythm (08/02 0700) Resp:  [16-24] 16 (08/02 0734) BP: (89-161)/(51-140) 108/51 (08/02 0734) SpO2:  [94 %-100 %] 99 % (08/02 0734)  Pre op weight 96.8 kg Current Weight  05/24/23 96.7 kg   CVP:  [2 mmHg-7 mmHg] 7 mmHg  Intake/Output from previous day: 08/01 0701 - 08/02 0700 In: 500 [IV Piggyback:500] Out: 1000 [Urine:1000]   Physical Exam:  Cardiovascular: RRR, no murmur Pulmonary: Slightly diminished bibasilar breath sounds  Abdomen: Soft, non tender, bowel sounds present. Extremities: Trace LE edema. Bilateral TMA.  Wound: Sternal wound is clean and dry.   Left TMA wound has been clean, dry, well healed. Small wound bottom of right TMA wound. Not draining this am.  Lab Results: CBC: Recent Labs    05/24/23 0300 05/25/23 0320  WBC 18.7* 15.1*  HGB 8.6* 8.0*  HCT 28.0* 25.9*  PLT 710* 666*   BMET:  Recent Labs    05/24/23 0300 05/25/23 0320  NA 131* 132*  K 3.7 3.6  CL 91* 93*  CO2 25 25  GLUCOSE 145* 103*  BUN 23* 23*  CREATININE 1.42* 1.22*  CALCIUM 8.3* 8.4*    PT/INR:  Lab Results  Component Value Date   INR 1.5 (H) 05/11/2023   INR 1.0 05/14/2023   INR 1.8 (H) 04/17/2022   ABG:  INR: Will add last result for INR, ABG once components are confirmed Will add last 4 CBG results once components are confirmed  Assessment/Plan:  1. CV - S/p anterior STEMI, cardiogenic shock, PAF- SR, first degree heart block. On Amiodarone 200 mg bid. Plavix and Apixaban stopped  in anticipation of I and D of sternum next week. 2.  Pulmonary - History of OSA-does not use CPAP;on bi pap at night. On room air. She on Robitussin PRN, Prednisone 80 at breakfast.Encourage incentive spirometer 3.  Expected post op acute blood loss anemia - H and H this am slightly decreased to 8 and 25.9 4. DM-CBGs 267/94/108. On Insulin. Pre op HGA1C 7.6 5. Hypervolemic hyponatremia-has had post op. Sodium this am remains up to 132. 6. AKI -Creatinine this am 1.22 7. Thrombocytosis -platelets this am decreased to 666,000. 8. ID-on Maxipime and Vancomycin. Leukocytosis-WBC this am decreased to 15,100. She remains afebrile, pro calcitonin 0.15. Blood culture pending. Event of chest pain yesterday with RR involvement. CT of chest/abd/pelvis showed slight increase in pericardial fluid collection, fluid extending through the subcutaneous tissues, slight rim enhancement of the pericardial portion of the fluid collection, and superimposed infection cannot be excluded. She will need to go to OR next week for I and D of sternum for infection/dehiscence 9. History of hypothyroidism-continue Levothyroxine 10. Right foot wound-wound care following   Maxmilian Trostel M ZimmermanPA-C 7:59 AM

## 2023-05-25 NOTE — Progress Notes (Signed)
NAME:  [name], MRN:  [id number], DOB:  01-14-1966, LOS: 21 ADMISSION DATE:  05/16/2023, CONSULTATION DATE: 05/11/2023 REFERRING MD: Dr. Cliffton Asters, CHIEF COMPLAINT: Chest pain  History of Present Illness:  57 year old female with a diabetes melitis, complicated with neuropathy and Wegener's granulomatosis who was initially admitted with acute anterior STEMI underwent PCI to LAD, who continues to require vasopressor support and IABP, echocardiogram showed mechanical complication including apical VSD, underwent VSD repair, remained intubated, on multiple vasopressor support, PCCM was consulted for help evaluation medical management  Pertinent  Medical History   Past Medical History:  Diagnosis Date   Chronic cough    Chronic pain    Diabetes mellitus without complication (HCC)    Dyspnea    GERD (gastroesophageal reflux disease)    Hypokalemia    Hypothyroidism    Left wrist pain 06/01/2021   Myonecrosis (HCC) 07/01/2021   Neurocardiogenic syncope    OSA (obstructive sleep apnea)    does not use CPAP   Osteomyelitis (HCC)    bilateral feet   Peripheral neuropathy    Presence of intrathecal pump    recieves Prialt/bupivicaine   Tear of gluteus medius tendon 07/01/2021   Wegener's granulomatosis 2009   Wegner's disease (congenital syphilitic osteochondritis)      Significant Hospital Events: Including procedures, antibiotic start and stop dates in addition to other pertinent events   Repair of small VSD following myocardial infarction.  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  Had been on immunosuppressive therapy until March of this year.  It was stop due to good symptom  control and evidence of immunosuppression. Continues to follow-up with rheumatologist on a regular basis  Objective   Blood pressure (!) 108/51, pulse 64, temperature 97.6 F (36.4 C), temperature source Oral, resp. rate 16, height 5\' 4"  (1.626 m), weight 96.7 kg, SpO2 99%. CVP:  [7 mmHg] 7 mmHg      Intake/Output Summary (Last 24 hours) at 05/25/2023 7829 Last data filed at 05/25/2023 5621 Gross per 24 hour  Intake 500 ml  Output 1000 ml  Net -500 ml   Filed Weights   05/22/23 0541 05/23/23 0600 05/24/23 0600  Weight: 96.4 kg 96.8 kg 96.7 kg    Examination: General: Middle-age, chronically ill-appearing HEENT: Moist oral mucosa Neuro: Awake alert oriented x 3 Chest: Clear breath sounds Heart: S1-S2 appreciated Abdomen: Soft, nontender Skin: No rash  Ancillary tests personally reviewed:  Labs reviewed White count 15.1-stable H&H of 8/26 Creatinine 1.22  Chest x-ray with bilateral infiltrates more prominent on the left, reviewed by myself  Assessment & Plan:   Coronary artery disease, acute anterior STEMI complicated with apical VSD Apical VSD s/p surgical repair -Unfortunately will require surgery for unstable sternum  Acute heart failure with reduced ejection fraction with cardiogenic shock  Acute respiratory failure with hypoxia/hypercapnia -Has been stable  Acute kidney injury due to ischemic ATN -Appears to be stabilizing -Continue to avoid nephrotoxic medications  Type 2 diabetes Continue SSI  Anemia -Related to ongoing acute problems  Morbid obesity History of obstructive sleep apnea  History of granulomatosis with polyangiitis -Treated will Rituxan previously  Possibility of HCAP -On antibiotics -Antibiotics can be discontinued after 5 days if cultures  remain negative -Blood culture 05/24/2023-negative to date  With increasing shortness of breath for last few days there was concern for possible GPA flare Was started on steroids -I believe we can  treat with just 40 mg of prednisone -Expectation is to continue prednisone at 40 mg for about 4 weeks -I encouraged patient to make sure that she is able to follow-up with rheumatology for follow-up -MPO and ANCA titers were ordered-they are not reliable markers for following progression of disease activity -Markers of inflammation were also not reliable as patient has recently had surgery   Best Practice (right click and "Reselect all SmartList Selections" daily)   Diet/type: Regular consistency DVT prophylaxis: Apixaban GI prophylaxis: PPI Lines: Central line, yes still needed  Foley:  Yes, and it is still needed Code Status:  full code Last date of multidisciplinary goals of care discussion [Per primary team]   Virl Diamond, MD [place] [facility] Pager: See Loretha Stapler

## 2023-05-25 NOTE — Progress Notes (Signed)
ANTICOAGULATION CONSULT NOTE-Follow Up  Pharmacy Consult for apixaban > heparin  Indication: Afib/ s/p VSD repair  Allergies  Allergen Reactions   Ibuprofen Other (See Comments)    Pt is unable to take this due to kidney problems.      Penicillins Rash and Other (See Comments)    Tolerated ceftriaxone and cefepime in past Has patient had a PCN reaction causing immediate rash, facial/tongue/throat swelling, SOB or lightheadedness with hypotension: No Has patient had a PCN reaction causing severe rash involving mucus membranes or skin necrosis: No Has patient had a PCN reaction that required hospitalization No Has patient had a PCN reaction occurring within the last 10 years: No If all of the above answers are "NO", then may proceed with Cephalosporin use.    Patient Measurements: Height: 5\' 4"  (162.6 cm) Weight: 96.3 kg (212 lb 4.9 oz) IBW/kg (Calculated) : 54.7 HEPARIN DW (KG): 77.8    Vital Signs: Temp: 98.4 F (36.9 C) (08/02 1911) Temp Source: Oral (08/02 1911) BP: 120/71 (08/02 1911) Pulse Rate: 70 (08/02 2043)  Labs: Recent Labs    05/23/23 0330 05/24/23 0300 05/24/23 1641 05/24/23 1855 05/25/23 0320 05/25/23 2115  HGB 8.9* 8.6*  --   --  8.0*  --   HCT 28.4* 28.0*  --   --  25.9*  --   PLT 681* 710*  --   --  666*  --   APTT  --   --   --   --   --  55*  CREATININE 1.43* 1.42*  --   --  1.22*  --   TROPONINIHS  --   --  197* 130*  --   --     Estimated Creatinine Clearance: 57.3 mL/min (A) (by C-G formula based on SCr of 1.22 mg/dL (H)).   Medical History: Past Medical History:  Diagnosis Date   Chronic cough    Chronic pain    Diabetes mellitus without complication (HCC)    Dyspnea    GERD (gastroesophageal reflux disease)    Hypokalemia    Hypothyroidism    Left wrist pain 06/01/2021   Myonecrosis (HCC) 07/01/2021   Neurocardiogenic syncope    OSA (obstructive sleep apnea)    does not use CPAP   Osteomyelitis (HCC)    bilateral feet    Peripheral neuropathy    Presence of intrathecal pump    recieves Prialt/bupivicaine   Tear of gluteus medius tendon 07/01/2021   Wegener's granulomatosis 2009   Wegner's disease (congenital syphilitic osteochondritis)     Medications:   ceFEPime (MAXIPIME) IV 2 g (05/25/23 2105)   heparin 1,000 Units/hr (05/25/23 1543)   vancomycin 500 mg (05/25/23 2155)     Assessment: 57 yo female s/p DES to LAD 7/12, s/p IABP >  VSD repair and PAF in SR on amiodarone  May need to return to OR next week with sternal wound dehisced  Patient has been on apixaban - last dose 8/1 am  Will begin heparin drip  Monitor via aptt with falsely elevated heparin level from apixaban CBC stable.  Initial aPTT is subtherapeutic at 55 seconds,   Goal of Therapy:  Heparin level 0.3-0.7 units/ml aPTT 66-102 Monitor platelets by anticoagulation protocol: Yes   Plan:  Increase heparin to 1200 units/h Recheck aPTT and heparin level in 6h  Fredonia Highland, PharmD, [place], [facility] Clinical Pharmacist [phone] Please check AMION for all [facility] Pharmacy numbers 05/25/2023

## 2023-05-25 NOTE — Progress Notes (Signed)
Pharmacy Antibiotic Note  [name] is a 57 y.o. female admitted on 05/12/2023 with STEMI> PCI, post op VSD repair.   WBC elevated, afebrile cough with yellow sputum. Pharmacy has been consulted for vancomycin and cefepime  dosing.  Vancomycin level above goal at 23 mcg/ml.  Plan: Adjust vancomycin to 500mg  IV q12h Cefepime 2gm q12h   Height: 5\' 4"  (162.6 cm) Weight: 96.3 kg (212 lb 4.9 oz) IBW/kg (Calculated) : 54.7  Temp (24hrs), Avg:98.2 F (36.8 C), Min:97.6 F (36.4 C), Max:98.9 F (37.2 C)  Recent Labs  Lab 05/21/23 0431 05/21/23 1717 05/22/23 0155 05/22/23 0909 05/23/23 0330 05/24/23 0300 05/25/23 0320 05/25/23 1410  WBC 16.3*  --  17.8*  --  18.6* 18.7* 15.1*  --   CREATININE  --    < > 1.78* 1.67* 1.43* 1.42* 1.22*  --   VANCOTROUGH  --   --   --   --   --   --   --  23*   < > = values in this interval not displayed.    Estimated Creatinine Clearance: 57.3 mL/min (A) (by C-G formula based on SCr of 1.22 mg/dL (H)).    Allergies  Allergen Reactions   Ibuprofen Other (See Comments)    Pt is unable to take this due to kidney problems.      Penicillins Rash and Other (See Comments)    Tolerated ceftriaxone and cefepime in past Has patient had a PCN reaction causing immediate rash, facial/tongue/throat swelling, SOB or lightheadedness with hypotension: No Has patient had a PCN reaction causing severe rash involving mucus membranes or skin necrosis: No Has patient had a PCN reaction that required hospitalization No Has patient had a PCN reaction occurring within the last 10 years: No If all of the above answers are "NO", then may proceed with Cephalosporin use.    Antimicrobials this admission: Post op abx vancomycin + levofloxacin  Dose adjustments this admission:   Microbiology results:  7/18 MRSA pcr neg, staph areus +   Fredonia Highland, PharmD, BCPS, [facility] Clinical Pharmacist [phone] Please check AMION for all [facility] Pharmacy  numbers 05/25/2023

## 2023-05-26 LAB — GLUCOSE, CAPILLARY
Glucose-Capillary: 96 mg/dL (ref 70–99)
Glucose-Capillary: 160 mg/dL — ABNORMAL HIGH (ref 70–99)
Glucose-Capillary: 209 mg/dL — ABNORMAL HIGH (ref 70–99)
Glucose-Capillary: 349 mg/dL — ABNORMAL HIGH (ref 70–99)

## 2023-05-26 LAB — APTT
aPTT: >200 seconds (ref 24–36)
aPTT: 84 seconds — ABNORMAL HIGH (ref 24–36)

## 2023-05-26 LAB — HEPARIN LEVEL (UNFRACTIONATED): Heparin Unfractionated: 1.07 IU/mL — ABNORMAL HIGH (ref 0.30–0.70)

## 2023-05-26 MED ORDER — ROPINIROLE HCL 0.25 MG PO TABS
0.2500 mg | ORAL_TABLET | Freq: Every day | ORAL | Status: DC
  Administered 2023-05-26: 0.25 mg via ORAL
  Filled 2023-05-26: qty 1

## 2023-05-26 MED ORDER — POTASSIUM CHLORIDE CRYS ER 20 MEQ PO TBCR
40.0000 meq | EXTENDED_RELEASE_TABLET | Freq: Four times a day (QID) | ORAL | Status: AC
  Administered 2023-05-26 (×2): 40 meq via ORAL
  Filled 2023-05-26 (×2): qty 2

## 2023-05-26 MED ORDER — INSULIN GLARGINE-YFGN 100 UNIT/ML ~~LOC~~ SOLN
18.0000 [IU] | Freq: Two times a day (BID) | SUBCUTANEOUS | Status: DC
  Administered 2023-05-26 (×2): 18 [IU] via SUBCUTANEOUS
  Filled 2023-05-26 (×4): qty 0.18

## 2023-05-26 MED ORDER — MORPHINE SULFATE (PF) 2 MG/ML IV SOLN
1.0000 mg | INTRAVENOUS | Status: DC | PRN
  Administered 2023-05-28 – 2023-05-29 (×3): 2 mg via INTRAVENOUS
  Filled 2023-05-26 (×4): qty 1

## 2023-05-26 MED ORDER — POTASSIUM CHLORIDE CRYS ER 20 MEQ PO TBCR: 20.0000 meq | EXTENDED_RELEASE_TABLET | Freq: Every day | ORAL | Status: DC

## 2023-05-26 NOTE — Progress Notes (Signed)
ANTICOAGULATION CONSULT NOTE-Follow Up  Pharmacy Consult for apixaban > heparin  Indication: Afib/ s/p VSD repair  Allergies  Allergen Reactions   Ibuprofen Other (See Comments)    Pt is unable to take this due to kidney problems.      Penicillins Rash and Other (See Comments)    Tolerated ceftriaxone and cefepime in past Has patient had a PCN reaction causing immediate rash, facial/tongue/throat swelling, SOB or lightheadedness with hypotension: No Has patient had a PCN reaction causing severe rash involving mucus membranes or skin necrosis: No Has patient had a PCN reaction that required hospitalization No Has patient had a PCN reaction occurring within the last 10 years: No If all of the above answers are "NO", then may proceed with Cephalosporin use.    Patient Measurements: Height: 5\' 4"  (162.6 cm) Weight: 96.3 kg (212 lb 4.9 oz) IBW/kg (Calculated) : 54.7 HEPARIN DW (KG): 77.8    Vital Signs: Temp: 98.5 F (36.9 C) (08/03 0724) Temp Source: Oral (08/03 0724) BP: 109/61 (08/03 0724) Pulse Rate: 66 (08/03 0310)  Labs: Recent Labs    05/24/23 0300 05/24/23 1641 05/24/23 1855 05/25/23 0320 05/25/23 2115 05/26/23 0550  HGB 8.6*  --   --  8.0*  --  8.0*  HCT 28.0*  --   --  25.9*  --  25.9*  PLT 710*  --   --  666*  --  636*  APTT  --   --   --   --  55* 97*  HEPARINUNFRC  --   --   --   --   --  1.07*  CREATININE 1.42*  --   --  1.22*  --  1.27*  TROPONINIHS  --  197* 130*  --   --   --     Estimated Creatinine Clearance: 55 mL/min (A) (by C-G formula based on SCr of 1.27 mg/dL (H)).   Medical History: Past Medical History:  Diagnosis Date   Chronic cough    Chronic pain    Diabetes mellitus without complication (HCC)    Dyspnea    GERD (gastroesophageal reflux disease)    Hypokalemia    Hypothyroidism    Left wrist pain 06/01/2021   Myonecrosis (HCC) 07/01/2021   Neurocardiogenic syncope    OSA (obstructive sleep apnea)    does not use CPAP    Osteomyelitis (HCC)    bilateral feet   Peripheral neuropathy    Presence of intrathecal pump    recieves Prialt/bupivicaine   Tear of gluteus medius tendon 07/01/2021   Wegener's granulomatosis 2009   Wegner's disease (congenital syphilitic osteochondritis)     Medications:   ceFEPime (MAXIPIME) IV 2 g (05/25/23 2105)   heparin 1,200 Units/hr (05/26/23 0400)   vancomycin 500 mg (05/25/23 2155)     Assessment: 57 yo female s/p DES to LAD 7/12, s/p IABP >  VSD repair and PAF in SR on amiodarone  May need to return to OR next week with sternal wound dehisced  Patient has been on apixaban - last dose 8/1 am  Will begin heparin drip  Monitor via aptt with falsely elevated heparin level from apixaban CBC stable.  APTT this AM is at goal at 97 seconds.  No overt bleeding or complications noted.  Goal of Therapy:  Heparin level 0.3-0.7 units/ml aPTT 66-102 Monitor platelets by anticoagulation protocol: Yes   Plan:  Continue IV heparin at current rate. Recheck aPTT in 6h Daily CBC, HL and aPTT.   Reece Leader,  Colon Flattery, BCCP Clinical Pharmacist  05/26/2023 7:39 AM   [facility] pharmacy phone numbers are listed on [email or web address]

## 2023-05-26 NOTE — Progress Notes (Addendum)
ANTICOAGULATION CONSULT NOTE-Follow Up  Pharmacy Consult for apixaban > heparin  Indication: Afib/ s/p VSD repair  Allergies  Allergen Reactions   Ibuprofen Other (See Comments)    Pt is unable to take this due to kidney problems.      Penicillins Rash and Other (See Comments)    Tolerated ceftriaxone and cefepime in past Has patient had a PCN reaction causing immediate rash, facial/tongue/throat swelling, SOB or lightheadedness with hypotension: No Has patient had a PCN reaction causing severe rash involving mucus membranes or skin necrosis: No Has patient had a PCN reaction that required hospitalization No Has patient had a PCN reaction occurring within the last 10 years: No If all of the above answers are "NO", then may proceed with Cephalosporin use.    Patient Measurements: Height: 5\' 4"  (162.6 cm) Weight: 96.3 kg (212 lb 4.9 oz) IBW/kg (Calculated) : 54.7 HEPARIN DW (KG): 77.8    Vital Signs: Temp: 98.2 F (36.8 C) (08/03 1552) Temp Source: Oral (08/03 1552) BP: 111/66 (08/03 1552)  Labs: Recent Labs    05/24/23 0300 05/24/23 1641 05/24/23 1855 05/25/23 0320 05/25/23 2115 05/26/23 0550 05/26/23 1555 05/26/23 1735  HGB 8.6*  --   --  8.0*  --  8.0*  --   --   HCT 28.0*  --   --  25.9*  --  25.9*  --   --   PLT 710*  --   --  666*  --  636*  --   --   APTT  --   --   --   --    < > 97* >200* 84*  HEPARINUNFRC  --   --   --   --   --  1.07*  --   --   CREATININE 1.42*  --   --  1.22*  --  1.27*  --   --   TROPONINIHS  --  197* 130*  --   --   --   --   --    < > = values in this interval not displayed.    Estimated Creatinine Clearance: 55 mL/min (A) (by C-G formula based on SCr of 1.27 mg/dL (H)).   Medical History: Past Medical History:  Diagnosis Date   Chronic cough    Chronic pain    Diabetes mellitus without complication (HCC)    Dyspnea    GERD (gastroesophageal reflux disease)    Hypokalemia    Hypothyroidism    Left wrist pain 06/01/2021    Myonecrosis (HCC) 07/01/2021   Neurocardiogenic syncope    OSA (obstructive sleep apnea)    does not use CPAP   Osteomyelitis (HCC)    bilateral feet   Peripheral neuropathy    Presence of intrathecal pump    recieves Prialt/bupivicaine   Tear of gluteus medius tendon 07/01/2021   Wegener's granulomatosis 2009   Wegner's disease (congenital syphilitic osteochondritis)     Medications:   ceFEPime (MAXIPIME) IV 2 g (05/26/23 1000)   heparin 1,200 Units/hr (05/26/23 1223)   vancomycin 500 mg (05/26/23 1225)     Assessment: 57 yo female s/p DES to LAD 7/12, s/p IABP >  VSD repair and PAF in SR on amiodarone  May need to return to OR next week with sternal wound dehisced  Patient has been on apixaban - last dose 8/1 am. Pharmacy dosing heparin  -aPTT was 200 tonight but repeat was 84 on 1200 units/hr -initial draw was error   Goal of Therapy:  Heparin level 0.3-0.7 units/ml aPTT 66-102 Monitor platelets by anticoagulation protocol: Yes   Plan:  Continue IV heparin at current rate. Daily CBC, HL and aPTT.   Harland German, PharmD Clinical Pharmacist **Pharmacist phone directory can now be found on [email or web address] ([email or web address]).  Listed under [facility] Pharmacy.

## 2023-05-26 NOTE — Plan of Care (Signed)
  Problem: Education: Goal: Knowledge of General Education information will improve Description: Including pain rating scale, medication(s)/side effects and non-pharmacologic comfort measures Outcome: Progressing   Problem: Health Behavior/Discharge Planning: Goal: Ability to manage health-related needs will improve Outcome: Progressing   Problem: Clinical Measurements: Goal: Ability to maintain clinical measurements within normal limits will improve Outcome: Progressing Goal: Will remain free from infection Outcome: Progressing Goal: Diagnostic test results will improve Outcome: Progressing Goal: Respiratory complications will improve Outcome: Progressing Goal: Cardiovascular complication will be avoided Outcome: Progressing   Problem: Activity: Goal: Risk for activity intolerance will decrease Outcome: Progressing   Problem: Nutrition: Goal: Adequate nutrition will be maintained Outcome: Progressing   Problem: Coping: Goal: Level of anxiety will decrease Outcome: Progressing   Problem: Elimination: Goal: Will not experience complications related to bowel motility Outcome: Progressing Goal: Will not experience complications related to urinary retention Outcome: Progressing   Problem: Pain Managment: Goal: General experience of comfort will improve Outcome: Progressing   Problem: Safety: Goal: Ability to remain free from injury will improve Outcome: Progressing   Problem: Skin Integrity: Goal: Risk for impaired skin integrity will decrease Outcome: Progressing   Problem: Education: Goal: Understanding of CV disease, CV risk reduction, and recovery process will improve Outcome: Progressing Goal: Individualized Educational Video(s) Outcome: Progressing   Problem: Activity: Goal: Ability to return to baseline activity level will improve Outcome: Progressing   Problem: Cardiovascular: Goal: Ability to achieve and maintain adequate cardiovascular perfusion  will improve Outcome: Progressing Goal: Vascular access site(s) Level 0-1 will be maintained Outcome: Progressing   Problem: Health Behavior/Discharge Planning: Goal: Ability to safely manage health-related needs after discharge will improve Outcome: Progressing   Problem: Education: Goal: Ability to describe self-care measures that may prevent or decrease complications (Diabetes Survival Skills Education) will improve Outcome: Progressing Goal: Individualized Educational Video(s) Outcome: Progressing   Problem: Coping: Goal: Ability to adjust to condition or change in health will improve Outcome: Progressing   Problem: Fluid Volume: Goal: Ability to maintain a balanced intake and output will improve Outcome: Progressing   Problem: Health Behavior/Discharge Planning: Goal: Ability to identify and utilize available resources and services will improve Outcome: Progressing Goal: Ability to manage health-related needs will improve Outcome: Progressing   Problem: Metabolic: Goal: Ability to maintain appropriate glucose levels will improve Outcome: Progressing   Problem: Nutritional: Goal: Maintenance of adequate nutrition will improve Outcome: Progressing Goal: Progress toward achieving an optimal weight will improve Outcome: Progressing   Problem: Skin Integrity: Goal: Risk for impaired skin integrity will decrease Outcome: Progressing   Problem: Tissue Perfusion: Goal: Adequacy of tissue perfusion will improve Outcome: Progressing   Problem: Education: Goal: Will demonstrate proper wound care and an understanding of methods to prevent future damage Outcome: Progressing Goal: Knowledge of disease or condition will improve Outcome: Progressing Goal: Knowledge of the prescribed therapeutic regimen will improve Outcome: Progressing Goal: Individualized Educational Video(s) Outcome: Progressing   Problem: Activity: Goal: Risk for activity intolerance will  decrease Outcome: Progressing   Problem: Cardiac: Goal: Will achieve and/or maintain hemodynamic stability Outcome: Progressing   Problem: Clinical Measurements: Goal: Postoperative complications will be avoided or minimized Outcome: Progressing   Problem: Respiratory: Goal: Respiratory status will improve Outcome: Progressing   Problem: Skin Integrity: Goal: Wound healing without signs and symptoms of infection Outcome: Progressing Goal: Risk for impaired skin integrity will decrease Outcome: Progressing

## 2023-05-26 NOTE — Progress Notes (Addendum)
[address]       [place] [address]             [phone]        15 Days Post-Op Procedure(s) (LRB): VENTRICULAR SEPTAL DEFECT (VSD) REPAIR (N/A) TRANSESOPHAGEAL ECHOCARDIOGRAM (N/A)  Subjective: Patient with sternal/incisional pain this am as well as complaints of muscle spasms.  Objective: Vital signs in last 24 hours: Temp:  [97.7 F (36.5 C)-98.5 F (36.9 C)] 98.5 F (36.9 C) (08/03 0724) Pulse Rate:  [66-71] 66 (08/03 0310) Cardiac Rhythm: Normal sinus rhythm (08/03 0701) Resp:  [14-21] 19 (08/03 0310) BP: (105-120)/(61-87) 109/61 (08/03 0724) SpO2:  [92 %-98 %] 92 % (08/03 0310) Weight:  [96.3 kg] 96.3 kg (08/02 1048)  Pre op weight 96.8 kg Current Weight  05/25/23 96.3 kg   CVP:  [4 mmHg] 4 mmHg  Intake/Output from previous day: 08/02 0701 - 08/03 0700 In: 130.5 [I.V.:130.5] Out: 450 [Urine:450]   Physical Exam:  Cardiovascular: RRR, no murmur Pulmonary: Clear  Abdomen: Soft, non tender, bowel sounds present. Extremities: No LE edema. Bilateral TMA.  Wound: Sternal wound is clean and dry.   Left TMA wound has been mostly clean, dry, . Small wound bottom of right TMA wound. Not draining this am.  Lab Results: CBC: Recent Labs    05/25/23 0320 05/26/23 0550  WBC 15.1* 17.4*  HGB 8.0* 8.0*  HCT 25.9* 25.9*  PLT 666* 636*   BMET:  Recent Labs    05/25/23 0320 05/26/23 0550  NA 132* 132*  K 3.6 3.2*  CL 93* 95*  CO2 25 25  GLUCOSE 103* 116*  BUN 23* 25*  CREATININE 1.22* 1.27*  CALCIUM 8.4* 8.5*    PT/INR:  Lab Results  Component Value Date   INR 1.5 (H) 05/11/2023   INR 1.0 05/08/2023   INR 1.8 (H) 04/17/2022   ABG:  INR: Will add last result for INR, ABG once components are confirmed Will add last 4 CBG results once components are confirmed  Assessment/Plan:  1. CV - S/p anterior STEMI, cardiogenic shock, PAF- SR, first degree heart block. On Amiodarone 200 mg bid. Plavix and Apixaban stopped in  anticipation of I and D of sternum this week. On Heparin drip per pharmacy. 2.  Pulmonary - History of OSA-does not use CPAP;on bi pap at night. On room air. She on Robitussin PRN, Prednisone at breakfast.Encourage incentive spirometer 3.  Expected post op acute blood loss anemia - H and H this am slightly decreased to 8 and 25.9 4. DM-CBGs 358/258/96. On Insulin. She was started on Prednisone. Will increase Insulin for better glucose control.Pre op HGA1C 7.6 5. Hypervolemic hyponatremia-has had post op. Sodium this am remains 132. 6. AKI -Creatinine this am 1.27 7. Thrombocytosis -platelets this am decreased to 636,000. 8. ID-on Maxipime and Vancomycin. Leukocytosis-WBC this am increased to 17,400. She remains afebrile, pro calcitonin 0.15. Blood culture shows no growth for 2 days. She will need to go to OR next week for I and D of sternum for infection/wire dehiscence 9. History of hypothyroidism-continue Levothyroxine 10. Right foot wound-wound care as ordered by wound care team 11. She is on Robaxin for muscle spasms PRN but states it does not help much 12. Regarding pain control, on Dilaudid and Oxy po PRN and Lidocaine patch. Will give IV Morphine PRN breakthrough pain  Stacey M ZimmermanPA-C 7:46 AM  Patient seen and examined, agree with findings and assessment above C/o involuntary muscle contractions-  will add low dose ropinirole  Stacey Spare C. Dorris Fetch, MD [facility] [phone]

## 2023-05-27 LAB — APTT
aPTT: 188 s (ref 24–36)
aPTT: 68 seconds — ABNORMAL HIGH (ref 24–36)

## 2023-05-27 LAB — MAGNESIUM: Magnesium: 2.3 mg/dL (ref 1.7–2.4)

## 2023-05-27 LAB — BASIC METABOLIC PANEL WITH GFR
Anion gap: 8 (ref 5–15)
BUN: 25 mg/dL — ABNORMAL HIGH (ref 6–20)
CO2: 26 mmol/L (ref 22–32)
Calcium: 8.3 mg/dL — ABNORMAL LOW (ref 8.9–10.3)
Chloride: 102 mmol/L (ref 98–111)
Creatinine, Ser: 1.21 mg/dL — ABNORMAL HIGH (ref 0.44–1.00)
GFR, Estimated: 52 mL/min — ABNORMAL LOW (ref >60)
Glucose, Bld: 95 mg/dL (ref 70–99)
Potassium: 3.6 mmol/L (ref 3.5–5.1)
Sodium: 136 mmol/L (ref 135–145)

## 2023-05-27 LAB — GLUCOSE, CAPILLARY
Glucose-Capillary: 71 mg/dL (ref 70–99)
Glucose-Capillary: 204 mg/dL — ABNORMAL HIGH (ref 70–99)
Glucose-Capillary: 322 mg/dL — ABNORMAL HIGH (ref 70–99)
Glucose-Capillary: 260 mg/dL — ABNORMAL HIGH (ref 70–99)

## 2023-05-27 LAB — TYPE AND SCREEN
ABO/RH(D): O POS
Antibody Screen: NEGATIVE

## 2023-05-27 LAB — CBC
HCT: 25.4 % — ABNORMAL LOW (ref 36.0–46.0)
Hemoglobin: 7.7 g/dL — ABNORMAL LOW (ref 12.0–15.0)
MCH: 27.3 pg (ref 26.0–34.0)
MCHC: 30.3 g/dL (ref 30.0–36.0)
MCV: 90.1 fL (ref 80.0–100.0)
Platelets: 613 10*3/uL — ABNORMAL HIGH (ref 150–400)
RBC: 2.82 MIL/uL — ABNORMAL LOW (ref 3.87–5.11)
RDW: 15.5 % (ref 11.5–15.5)
WBC: 15 10*3/uL — ABNORMAL HIGH (ref 4.0–10.5)
nRBC: 0 % (ref 0.0–0.2)

## 2023-05-27 MED ORDER — INSULIN ASPART 100 UNIT/ML IJ SOLN
6.0000 [IU] | Freq: Two times a day (BID) | INTRAMUSCULAR | Status: DC
  Administered 2023-05-27: 6 [IU] via SUBCUTANEOUS

## 2023-05-27 MED ORDER — METOPROLOL TARTRATE 12.5 MG HALF TABLET
12.5000 mg | ORAL_TABLET | Freq: Once | ORAL | Status: AC
  Administered 2023-05-28: 12.5 mg via ORAL
  Filled 2023-05-27: qty 1

## 2023-05-27 MED ORDER — ROPINIROLE HCL 0.25 MG PO TABS
0.2500 mg | ORAL_TABLET | Freq: Two times a day (BID) | ORAL | Status: DC
  Administered 2023-05-27 – 2023-05-29 (×4): 0.25 mg via ORAL
  Filled 2023-05-27 (×6): qty 1

## 2023-05-27 MED ORDER — TEMAZEPAM 15 MG PO CAPS: 15.0000 mg | ORAL_CAPSULE | Freq: Once | ORAL | Status: DC | PRN

## 2023-05-27 MED ORDER — CHLORHEXIDINE GLUCONATE CLOTH 2 % EX PADS
6.0000 | MEDICATED_PAD | Freq: Once | CUTANEOUS | Status: AC
  Administered 2023-05-27: 6 via TOPICAL

## 2023-05-27 MED ORDER — POTASSIUM CHLORIDE CRYS ER 20 MEQ PO TBCR
40.0000 meq | EXTENDED_RELEASE_TABLET | Freq: Every day | ORAL | Status: AC
  Administered 2023-05-27: 40 meq via ORAL
  Filled 2023-05-27: qty 2

## 2023-05-27 MED ORDER — INSULIN GLARGINE-YFGN 100 UNIT/ML ~~LOC~~ SOLN
15.0000 [IU] | Freq: Two times a day (BID) | SUBCUTANEOUS | Status: DC
  Administered 2023-05-27 (×2): 15 [IU] via SUBCUTANEOUS
  Filled 2023-05-27 (×4): qty 0.15

## 2023-05-27 MED ORDER — CHLORHEXIDINE GLUCONATE 0.12 % MT SOLN
15.0000 mL | Freq: Once | OROMUCOSAL | Status: AC
  Administered 2023-05-28: 15 mL via OROMUCOSAL
  Filled 2023-05-27: qty 15

## 2023-05-27 MED ORDER — CHLORHEXIDINE GLUCONATE CLOTH 2 % EX PADS
6.0000 | MEDICATED_PAD | Freq: Once | CUTANEOUS | Status: AC
  Administered 2023-05-28: 6 via TOPICAL

## 2023-05-27 MED ORDER — BISACODYL 5 MG PO TBEC
5.0000 mg | DELAYED_RELEASE_TABLET | Freq: Once | ORAL | Status: AC
  Administered 2023-05-27: 5 mg via ORAL
  Filled 2023-05-27: qty 1

## 2023-05-27 MED ORDER — POTASSIUM CHLORIDE CRYS ER 20 MEQ PO TBCR
20.0000 meq | EXTENDED_RELEASE_TABLET | Freq: Every day | ORAL | Status: DC
  Administered 2023-05-28 – 2023-05-29 (×2): 20 meq via ORAL
  Filled 2023-05-27 (×2): qty 1

## 2023-05-27 NOTE — Plan of Care (Signed)
  Problem: Education: Goal: Knowledge of General Education information will improve Description: Including pain rating scale, medication(s)/side effects and non-pharmacologic comfort measures Outcome: Progressing   Problem: Health Behavior/Discharge Planning: Goal: Ability to manage health-related needs will improve Outcome: Progressing   Problem: Clinical Measurements: Goal: Ability to maintain clinical measurements within normal limits will improve Outcome: Progressing Goal: Diagnostic test results will improve Outcome: Progressing   Problem: Activity: Goal: Risk for activity intolerance will decrease Outcome: Progressing   Problem: Nutrition: Goal: Adequate nutrition will be maintained Outcome: Progressing   Problem: Coping: Goal: Level of anxiety will decrease Outcome: Progressing   Problem: Elimination: Goal: Will not experience complications related to bowel motility Outcome: Progressing Goal: Will not experience complications related to urinary retention Outcome: Progressing   Problem: Safety: Goal: Ability to remain free from injury will improve Outcome: Progressing   Problem: Skin Integrity: Goal: Risk for impaired skin integrity will decrease Outcome: Progressing   Problem: Education: Goal: Knowledge of disease or condition will improve Outcome: Progressing Goal: Knowledge of the prescribed therapeutic regimen will improve Outcome: Progressing   Problem: Activity: Goal: Risk for activity intolerance will decrease Outcome: Progressing   Problem: Cardiac: Goal: Will achieve and/or maintain hemodynamic stability Outcome: Progressing   Problem: Respiratory: Goal: Respiratory status will improve Outcome: Progressing

## 2023-05-27 NOTE — Progress Notes (Signed)
ANTICOAGULATION CONSULT NOTE-Follow Up  Pharmacy Consult for apixaban > heparin  Indication: Afib/ s/p VSD repair  Allergies  Allergen Reactions   Ibuprofen Other (See Comments)    Pt is unable to take this due to kidney problems.      Penicillins Rash and Other (See Comments)    Tolerated ceftriaxone and cefepime in past Has patient had a PCN reaction causing immediate rash, facial/tongue/throat swelling, SOB or lightheadedness with hypotension: No Has patient had a PCN reaction causing severe rash involving mucus membranes or skin necrosis: No Has patient had a PCN reaction that required hospitalization No Has patient had a PCN reaction occurring within the last 10 years: No If all of the above answers are "NO", then may proceed with Cephalosporin use.    Patient Measurements: Height: 5\' 4"  (162.6 cm) Weight: 96.3 kg (212 lb 4.9 oz) IBW/kg (Calculated) : 54.7 HEPARIN DW (KG): 77.8    Vital Signs: Temp: 98.5 F (36.9 C) (08/04 0310) Temp Source: Oral (08/04 0310) BP: 106/91 (08/04 0310) Pulse Rate: 61 (08/04 0310)  Labs: Recent Labs     0000 05/24/23 1641 05/24/23 1855 05/25/23 0320 05/25/23 2115 05/26/23 0550 05/26/23 1555 05/26/23 1735 05/27/23 0446 05/27/23 0641  HGB   < >  --   --  8.0*  --  8.0*  --   --  7.7*  --   HCT  --   --   --  25.9*  --  25.9*  --   --  25.4*  --   PLT  --   --   --  666*  --  636*  --   --  613*  --   APTT  --   --   --   --    < > 97*   < > 84* 188* 68*  HEPARINUNFRC  --   --   --   --   --  1.07*  --   --   --   --   CREATININE  --   --   --  1.22*  --  1.27*  --   --  1.21*  --   TROPONINIHS  --  197* 130*  --   --   --   --   --   --   --    < > = values in this interval not displayed.    Estimated Creatinine Clearance: 57.7 mL/min (A) (by C-G formula based on SCr of 1.21 mg/dL (H)).   Medical History: Past Medical History:  Diagnosis Date   Chronic cough    Chronic pain    Diabetes mellitus without complication  (HCC)    Dyspnea    GERD (gastroesophageal reflux disease)    Hypokalemia    Hypothyroidism    Left wrist pain 06/01/2021   Myonecrosis (HCC) 07/01/2021   Neurocardiogenic syncope    OSA (obstructive sleep apnea)    does not use CPAP   Osteomyelitis (HCC)    bilateral feet   Peripheral neuropathy    Presence of intrathecal pump    recieves Prialt/bupivicaine   Tear of gluteus medius tendon 07/01/2021   Wegener's granulomatosis 2009   Wegner's disease (congenital syphilitic osteochondritis)     Medications:   ceFEPime (MAXIPIME) IV 2 g (05/26/23 2138)   heparin 1,200 Units/hr (05/26/23 1223)   vancomycin 500 mg (05/26/23 2236)     Assessment: 57 yo female s/p DES to LAD 7/12, s/p IABP >  VSD repair and PAF in  SR on amiodarone  May need to return to OR next week with sternal wound dehisced  Patient has been on apixaban - last dose 8/1 am. Pharmacy dosing heparin  -aPTT was 200 tonight but repeat was 84 on 1200 units/hr -initial draw was error  8/4 AM: aPTT 188, per RN drawn in error, but repeat aPTT 68 (therapeutic) on 1200 units/hr. No issues with drawing the aPTT or with heparin running. Hgb 8>7.7  Goal of Therapy:  Heparin level 0.3-0.7 units/ml aPTT 66-102 Monitor platelets by anticoagulation protocol: Yes   Plan:  Continue IV heparin at current rate. Check aPTT daily Check aPTT and heparin level daily until levels correlate Daily CBC Monitor for signs/symptoms of bleeding   Arabella Merles, PharmD. Clinical Pharmacist 05/27/2023 7:17 AM

## 2023-05-27 NOTE — Progress Notes (Addendum)
[address]       [place] [address]             [phone]        16 Days Post-Op Procedure(s) (LRB): VENTRICULAR SEPTAL DEFECT (VSD) REPAIR (N/A) TRANSESOPHAGEAL ECHOCARDIOGRAM (N/A)  Subjective: Patient states medicine last night helped with muscle contractions  Objective: Vital signs in last 24 hours: Temp:  [97.9 F (36.6 C)-98.5 F (36.9 C)] 98.5 F (36.9 C) (08/04 0736) Pulse Rate:  [61-77] 61 (08/04 0310) Cardiac Rhythm: Normal sinus rhythm (08/03 1919) Resp:  [13-22] 17 (08/04 0736) BP: (104-111)/(54-91) 109/70 (08/04 0736) SpO2:  [93 %-98 %] 93 % (08/04 0310) Weight:  [96.3 kg] 96.3 kg (08/04 0500)  Pre op weight 96.8 kg Current Weight  05/27/23 96.3 kg   CVP:  [4 mmHg-6 mmHg] 6 mmHg  Intake/Output from previous day: 08/03 0701 - 08/04 0700 In: 1304.3 [P.O.:720; I.V.:284.3; IV Piggyback:300] Out: 700 [Urine:700]   Physical Exam:  Cardiovascular: RRR, no murmur Pulmonary: Clear  Abdomen: Soft, non tender, bowel sounds present. Extremities: No LE edema. Bilateral TMA.  Wound: Sternal wound is clean and dry.   Left TMA wound has been mostly clean, dry . Small wound bottom of right TMA wound. Not draining this am.  Lab Results: CBC: Recent Labs    05/26/23 0550 05/27/23 0446  WBC 17.4* 15.0*  HGB 8.0* 7.7*  HCT 25.9* 25.4*  PLT 636* 613*   BMET:  Recent Labs    05/26/23 0550 05/27/23 0446  NA 132* 136  K 3.2* 3.6  CL 95* 102  CO2 25 26  GLUCOSE 116* 95  BUN 25* 25*  CREATININE 1.27* 1.21*  CALCIUM 8.5* 8.3*    PT/INR:  Lab Results  Component Value Date   INR 1.5 (H) 05/11/2023   INR 1.0 05/07/2023   INR 1.8 (H) 04/17/2022   ABG:  INR: Will add last result for INR, ABG once components are confirmed Will add last 4 CBG results once components are confirmed  Assessment/Plan:  1. CV - S/p anterior STEMI, cardiogenic shock, PAF- SR, first degree heart block. On Amiodarone 200 mg bid, Spironolactone 25  mg daily, and Plavix (DES to LAD) 75 mg daily. Apixaban stopped in anticipation of I and D of sternum this week. On Heparin drip per pharmacy. 2.  Pulmonary - History of OSA-does not use CPAP;on bi pap at night. On room air. She on Robitussin PRN, Prednisone at breakfast.Encourage incentive spirometer 3.  Expected post op acute blood loss anemia - H and H this am slightly decreased to 7.7 and 25.4 4. DM-CBGs 209/349/71. On Insulin but will add scheduled meal coverage bid for better glucose control. She was started on Prednisone. Will increase Insulin for better glucose control.Pre op HGA1C 7.6 5. Hypervolemic hyponatremia-has had post op. Sodium this am remains 132. 6. AKI -Creatinine this am 1.21 7. Thrombocytosis -platelets this am decreased to 636,000. 8. ID-on Maxipime and Vancomycin. Leukocytosis-WBC this am decreased to 15,000. She remains afebrile, pro calcitonin 0.15. Blood culture shows no growth for 2 days. She will need to go to OR this week for I and D of sternum for infection/wire dehiscence 9. History of hypothyroidism-continue Levothyroxine 10. Right foot wound-wound care as ordered by wound care team 11. Supplement potassium   Stacey M ZimmermanPA-C 8:02 AM Patient seen and examined, agree with above Requip helped but spasms started around 4 PM- will increase to BID Afebrile and blood cultures negative Mobility limited  by foot ulcer  [name]. Dorris Fetch, MD [facility] [phone]

## 2023-05-27 NOTE — Progress Notes (Signed)
Physical Therapy Treatment Patient Details Name: [name] MRN: [id number] DOB: 1966/04/15 Today's Date: 05/27/2023   History of Present Illness Pt is 57 year old presented to [facility] on  05/20/2023 for STEMI and underwent PCI to LAD on 7/12. Pt with cardiogenic shock and acute heart failure. Pt continued with chest pain and found to have apical ventral septal defect. Pt underwent rt heart cath and IABP placed on 7/14. Underwent surgical ventral septal defect repair on 7/19. IABP removed and pt extubated 7/20; imaging shows migration of sternal suture, increase in right pericardial fluid collection contiguous with inferior margin of sternotomy site; plan for OR week of 8/4 for I&D/revision of sternotomy.  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    PT Comments  Continuing work on functional mobility and activity tolerance;  session focused on functional transfers and gentle progression of gait with RW; Good progress noted, needing less assist with bed mobility especially; Able to also work on using muscle power to stand, rather than depending on momentum (anticipate needing more work on this); Noting plans for return to OR sometime this coming week for I&D of sternum; I still anticipate good progress postop; Patient will benefit from intensive inpatient follow up therapy, >3 hours/day;   Worth considering ordering a PRAFO with a walking sole for her R foot for protection with bouts of gait, and to keep functional ankle ROM R foot and ankle while at rest     If plan is discharge home, recommend the following: A lot of help with walking and/or transfers;A lot of help with bathing/dressing/bathroom;Assistance with cooking/housework;Assist for transportation;Help with stairs or ramp for entrance   Can travel by  private vehicle        Equipment Recommendations  Other (comment) (TBA)    Recommendations for Other Services Rehab consult     Precautions / Restrictions Precautions Precautions: Fall;Sternal Precaution Booklet Issued: Yes (comment) Precaution Comments: pt able to recall precautions; occasional cues for hands on knees for sit<>stand Restrictions RLE Weight Bearing: Weight bearing as tolerated Other Position/Activity Restrictions: sternal prec     Mobility  Bed Mobility Overal bed mobility: Needs Assistance Bed Mobility: Rolling, Sidelying to Sit Rolling: Supervision Sidelying to sit: Min guard       General bed mobility comments: Close guard for safety and precautions    Transfers Overall transfer level: Needs assistance Equipment used: Rolling walker (2 wheels) Transfers: Sit to/from Stand, Bed to chair/wheelchair/BSC Sit to Stand: Min assist   Step pivot transfers: Min assist       General transfer comment: pt dependent on momentum with initial stand from bed, got to standing very quickly; min assist to steady with pivot steps bed to [facility]; Better control with sit to stand from [facility], and less dependence on momentum    Ambulation/Gait Ambulation/Gait assistance: Min assist, +2 safety/equipment Gait Distance (Feet): 4 Feet Assistive device: Rolling walker (2 wheels) Gait Pattern/deviations: Step-through pattern, Decreased step length - right, Decreased step length - left       General Gait Details: Opted to walk 4 ft from [facility] to recliner; heavy dependence on UE support from [employer] Mobility     Tilt Bed    Modified Rankin (Stroke Patients Only)       Balance  Sitting balance-Leahy Scale: Fair       Standing balance-Leahy Scale: Poor                              Cognition Arousal/Alertness: Awake/alert Behavior During Therapy: WFL for tasks assessed/performed Overall Cognitive Status: Within  Functional Limits for tasks assessed                                 General Comments: can be tangential, able to follow commands with incresaed response time        Exercises      General Comments General comments (skin integrity, edema, etc.): VSS on Room Air      Pertinent Vitals/Pain Pain Assessment Pain Assessment: Faces Faces Pain Scale: Hurts even more Pain Location: chest incision with coughing Pain Descriptors / Indicators: Operative site guarding, Grimacing Pain Intervention(s): Limited activity within patient's tolerance, Monitored during session, Other (comment) (pt used heart pillow to splint while coughing)    Home Living                          Prior Function            PT Goals (current goals can now be found in the care plan section) Acute Rehab PT Goals Patient Stated Goal: immediate request to get to [facility] PT Goal Formulation: With patient Time For Goal Achievement: 06/11/2023 Potential to Achieve Goals: Fair Progress towards PT goals: Progressing toward goals    Frequency    Min 1X/week      PT Plan Current plan remains appropriate    Co-evaluation              AM-PAC PT "6 Clicks" Mobility   Outcome Measure  Help needed turning from your back to your side while in a flat bed without using bedrails?: A Little Help needed moving from lying on your back to sitting on the side of a flat bed without using bedrails?: A Little Help needed moving to and from a bed to a chair (including a wheelchair)?: A Little Help needed standing up from a chair using your arms (e.g., wheelchair or bedside chair)?: A Lot Help needed to walk in hospital room?: A Lot Help needed climbing 3-5 steps with a railing? : Total 6 Click Score: 14    End of Session Equipment Utilized During Treatment: Gait belt Activity Tolerance: Patient tolerated treatment well Patient left: in chair;with call bell/phone within reach;with nursing/sitter in  room;with chair alarm set Nurse Communication: Mobility status PT Visit Diagnosis: Other abnormalities of gait and mobility (R26.89);Muscle weakness (generalized) (M62.81);Difficulty in walking, not elsewhere classified (R26.2);Pain Pain - part of body:  (chest)     Time: 9562-1308 PT Time Calculation (min) (ACUTE ONLY): 25 min  Charges:    $Gait Training: 8-22 mins $Therapeutic Activity: 8-22 mins PT General Charges $$ ACUTE PT VISIT: 1 Visit                     Van Clines, PT  Acute Rehabilitation Services Office [phone] Secure Chat welcomed    Levi Aland 05/27/2023, 12:07 PM

## 2023-05-28 ENCOUNTER — Inpatient Hospital Stay (HOSPITAL_COMMUNITY): Payer: BC Managed Care – PPO | Admitting: Certified Registered Nurse Anesthetist

## 2023-05-28 ENCOUNTER — Encounter (HOSPITAL_COMMUNITY): Payer: Self-pay | Admitting: Thoracic Surgery (Cardiothoracic Vascular Surgery)

## 2023-05-28 ENCOUNTER — Inpatient Hospital Stay (HOSPITAL_COMMUNITY): Payer: BC Managed Care – PPO

## 2023-05-28 ENCOUNTER — Other Ambulatory Visit: Payer: Self-pay

## 2023-05-28 ENCOUNTER — Encounter (HOSPITAL_COMMUNITY)
Admission: EM | Disposition: E | Payer: Self-pay | Source: Home / Self Care | Attending: Thoracic Surgery (Cardiothoracic Vascular Surgery)

## 2023-05-28 DIAGNOSIS — I2102 ST elevation (STEMI) myocardial infarction involving left anterior descending coronary artery: Secondary | ICD-10-CM | POA: Diagnosis not present

## 2023-05-28 DIAGNOSIS — T8132XA Disruption of internal operation (surgical) wound, not elsewhere classified, initial encounter: Secondary | ICD-10-CM

## 2023-05-28 DIAGNOSIS — I232 Ventricular septal defect as current complication following acute myocardial infarction: Secondary | ICD-10-CM | POA: Diagnosis not present

## 2023-05-28 DIAGNOSIS — T8131XA Disruption of external operation (surgical) wound, not elsewhere classified, initial encounter: Secondary | ICD-10-CM

## 2023-05-28 HISTORY — PX: IRRIGATION AND DEBRIDEMENT STERNOCLAVICULAR JOINT-STERNUM AND RIBS: SHX6785

## 2023-05-28 LAB — GLUCOSE, CAPILLARY
Glucose-Capillary: 102 mg/dL — ABNORMAL HIGH (ref 70–99)
Glucose-Capillary: 81 mg/dL (ref 70–99)
Glucose-Capillary: 94 mg/dL (ref 70–99)
Glucose-Capillary: 283 mg/dL — ABNORMAL HIGH (ref 70–99)

## 2023-05-28 LAB — PREALBUMIN: Prealbumin: 22 mg/dL (ref 18–38)

## 2023-05-28 LAB — AEROBIC/ANAEROBIC CULTURE W GRAM STAIN (SURGICAL/DEEP WOUND)

## 2023-05-28 LAB — HEPARIN LEVEL (UNFRACTIONATED): Heparin Unfractionated: 0.47 IU/mL (ref 0.30–0.70)

## 2023-05-28 SURGERY — IRRIGATION AND DEBRIDEMENT OF STERNOCLAVICULAR JOINT-STERNUM AND RIBS
Anesthesia: General

## 2023-05-28 MED ORDER — JUVEN PO PACK
1.0000 | PACK | Freq: Two times a day (BID) | ORAL | Status: DC
  Administered 2023-05-28 – 2023-05-29 (×2): 1 via ORAL
  Filled 2023-05-28 (×2): qty 1

## 2023-05-28 MED ORDER — OXYCODONE HCL 5 MG/5ML PO SOLN: 5.0000 mg | Freq: Once | ORAL | Status: DC | PRN

## 2023-05-28 MED ORDER — PROPOFOL 10 MG/ML IV BOLUS
INTRAVENOUS | Status: AC
  Filled 2023-05-28: qty 20

## 2023-05-28 MED ORDER — MIDAZOLAM HCL 2 MG/2ML IJ SOLN
INTRAMUSCULAR | Status: AC
  Filled 2023-05-28: qty 2

## 2023-05-28 MED ORDER — AMIODARONE HCL 200 MG PO TABS
200.0000 mg | ORAL_TABLET | Freq: Every day | ORAL | Status: DC
  Administered 2023-05-29: 200 mg via ORAL
  Filled 2023-05-28: qty 1

## 2023-05-28 MED ORDER — EPHEDRINE 5 MG/ML INJ
INTRAVENOUS | Status: AC
  Filled 2023-05-28: qty 5

## 2023-05-28 MED ORDER — ADULT MULTIVITAMIN W/MINERALS CH
1.0000 | ORAL_TABLET | Freq: Every day | ORAL | Status: DC
  Administered 2023-05-28 – 2023-05-29 (×2): 1 via ORAL
  Filled 2023-05-28 (×2): qty 1

## 2023-05-28 MED ORDER — INSULIN ASPART 100 UNIT/ML IJ SOLN
0.0000 [IU] | INTRAMUSCULAR | Status: DC
  Administered 2023-05-28 (×2): 5 [IU] via SUBCUTANEOUS
  Administered 2023-05-29: 2 [IU] via SUBCUTANEOUS

## 2023-05-28 MED ORDER — 0.9 % SODIUM CHLORIDE (POUR BTL) OPTIME
TOPICAL | Status: DC | PRN
Start: 2023-05-28 — End: 2023-05-28
  Administered 2023-05-28: 2000 mL

## 2023-05-28 MED ORDER — EPHEDRINE SULFATE-NACL 50-0.9 MG/10ML-% IV SOSY
PREFILLED_SYRINGE | INTRAVENOUS | Status: DC | PRN
  Administered 2023-05-28 (×2): 5 mg via INTRAVENOUS

## 2023-05-28 MED ORDER — VITAMIN C 500 MG PO TABS
500.0000 mg | ORAL_TABLET | Freq: Every day | ORAL | Status: DC
  Administered 2023-05-28 – 2023-05-29 (×2): 500 mg via ORAL
  Filled 2023-05-28 (×2): qty 1

## 2023-05-28 MED ORDER — ZINC SULFATE 220 (50 ZN) MG PO CAPS
220.0000 mg | ORAL_CAPSULE | Freq: Every day | ORAL | Status: DC
  Administered 2023-05-28 – 2023-05-29 (×2): 220 mg via ORAL
  Filled 2023-05-28 (×2): qty 1

## 2023-05-28 MED ORDER — MIDAZOLAM HCL 2 MG/2ML IJ SOLN
INTRAMUSCULAR | Status: DC | PRN
  Administered 2023-05-28: 2 mg via INTRAVENOUS

## 2023-05-28 MED ORDER — FENTANYL CITRATE (PF) 100 MCG/2ML IJ SOLN
25.0000 ug | INTRAMUSCULAR | Status: DC | PRN
  Administered 2023-05-28: 50 ug via INTRAVENOUS

## 2023-05-28 MED ORDER — FENTANYL CITRATE (PF) 250 MCG/5ML IJ SOLN
INTRAMUSCULAR | Status: DC | PRN
  Administered 2023-05-28: 50 ug via INTRAVENOUS
  Administered 2023-05-28: 100 ug via INTRAVENOUS

## 2023-05-28 MED ORDER — SODIUM CHLORIDE 0.9 % IV SOLN: INTRAVENOUS | Status: DC | PRN

## 2023-05-28 MED ORDER — FENTANYL CITRATE (PF) 250 MCG/5ML IJ SOLN
INTRAMUSCULAR | Status: AC
  Filled 2023-05-28: qty 5

## 2023-05-28 MED ORDER — ONDANSETRON HCL 4 MG/2ML IJ SOLN
INTRAMUSCULAR | Status: DC | PRN
Start: 2023-05-28 — End: 2023-05-28
  Administered 2023-05-28: 4 mg via INTRAVENOUS

## 2023-05-28 MED ORDER — HEPARIN (PORCINE) 25000 UT/250ML-% IV SOLN
1200.0000 [IU]/h | INTRAVENOUS | Status: DC
  Administered 2023-05-28: 1200 [IU]/h via INTRAVENOUS
  Filled 2023-05-28: qty 250

## 2023-05-28 MED ORDER — MIDAZOLAM HCL 2 MG/2ML IJ SOLN: 0.5000 mg | Freq: Once | INTRAMUSCULAR | Status: DC | PRN

## 2023-05-28 MED ORDER — INSULIN GLARGINE-YFGN 100 UNIT/ML ~~LOC~~ SOLN
12.0000 [IU] | Freq: Two times a day (BID) | SUBCUTANEOUS | Status: DC
  Administered 2023-05-28 – 2023-05-29 (×2): 12 [IU] via SUBCUTANEOUS
  Filled 2023-05-28 (×3): qty 0.12

## 2023-05-28 MED ORDER — PHENYLEPHRINE 80 MCG/ML (10ML) SYRINGE FOR IV PUSH (FOR BLOOD PRESSURE SUPPORT)
PREFILLED_SYRINGE | INTRAVENOUS | Status: DC | PRN
  Administered 2023-05-28: 240 ug via INTRAVENOUS

## 2023-05-28 MED ORDER — DEXAMETHASONE SODIUM PHOSPHATE 10 MG/ML IJ SOLN
INTRAMUSCULAR | Status: DC | PRN
  Administered 2023-05-28: 5 mg via INTRAVENOUS

## 2023-05-28 MED ORDER — MEPERIDINE HCL 25 MG/ML IJ SOLN: 6.2500 mg | INTRAMUSCULAR | Status: DC | PRN

## 2023-05-28 MED ORDER — PHENYLEPHRINE HCL-NACL 20-0.9 MG/250ML-% IV SOLN
INTRAVENOUS | Status: DC | PRN
  Administered 2023-05-28: 60 ug/min via INTRAVENOUS

## 2023-05-28 MED ORDER — PROMETHAZINE HCL 25 MG/ML IJ SOLN: 6.2500 mg | INTRAMUSCULAR | Status: DC | PRN

## 2023-05-28 MED ORDER — OXYCODONE HCL 5 MG PO TABS: 5.0000 mg | ORAL_TABLET | Freq: Once | ORAL | Status: DC | PRN

## 2023-05-28 MED ORDER — LIDOCAINE 2% (20 MG/ML) 5 ML SYRINGE
INTRAMUSCULAR | Status: DC | PRN
  Administered 2023-05-28: 40 mg via INTRAVENOUS

## 2023-05-28 MED ORDER — FENTANYL CITRATE (PF) 100 MCG/2ML IJ SOLN
INTRAMUSCULAR | Status: AC
  Filled 2023-05-28: qty 2

## 2023-05-28 MED ORDER — PROPOFOL 10 MG/ML IV BOLUS
INTRAVENOUS | Status: DC | PRN
Start: 2023-05-28 — End: 2023-05-28
  Administered 2023-05-28: 80 mg via INTRAVENOUS

## 2023-05-28 SURGICAL SUPPLY — 53 items
BLADE CLIPPER SURG (BLADE) ×1 IMPLANT
BLADE SURG 10 STRL SS (BLADE) ×1 IMPLANT
CANISTER SUCT 3000ML PPV (MISCELLANEOUS) ×1 IMPLANT
CANISTER WOUNDNEG PRESSURE 500 (CANNISTER) IMPLANT
CLIP TI MEDIUM 6 (CLIP) IMPLANT
CLIP TI WIDE RED SMALL 6 (CLIP) IMPLANT
CNTNR URN SCR LID CUP LEK RST (MISCELLANEOUS) IMPLANT
CONT SPEC 4OZ STRL OR WHT (MISCELLANEOUS)
DRAPE HALF SHEET 40X57 (DRAPES) IMPLANT
DRAPE LAPAROSCOPIC ABDOMINAL (DRAPES) ×1 IMPLANT
DRAPE WARM FLUID 44X44 (DRAPES) IMPLANT
DRSG VAC GRANUFOAM LG (GAUZE/BANDAGES/DRESSINGS) IMPLANT
ELECT REM PT RETURN 9FT ADLT (ELECTROSURGICAL) ×1
ELECT SOLID GEL RDN PRO-PADZ (MISCELLANEOUS) ×1
ELECTRODE REM PT RTRN 9FT ADLT (ELECTROSURGICAL) ×1 IMPLANT
ELECTRODE SOLI GEL RDN PROPADZ (MISCELLANEOUS) IMPLANT
GAUZE 4X4 16PLY ~~LOC~~+RFID DBL (SPONGE) ×1 IMPLANT
GAUZE PAD ABD 8X10 STRL (GAUZE/BANDAGES/DRESSINGS) IMPLANT
GAUZE SPONGE 4X4 12PLY STRL (GAUZE/BANDAGES/DRESSINGS) ×1 IMPLANT
GLOVE BIO SURGEON STRL SZ7.5 (GLOVE) ×2 IMPLANT
GLOVE BIOGEL PI IND STRL 7.0 (GLOVE) IMPLANT
GLOVE BIOGEL PI IND STRL 7.5 (GLOVE) IMPLANT
GLOVE ECLIPSE 7.5 STRL STRAW (GLOVE) IMPLANT
GOWN STRL REUS W/ TWL LRG LVL3 (GOWN DISPOSABLE) ×4 IMPLANT
GOWN STRL REUS W/ TWL XL LVL3 (GOWN DISPOSABLE) ×1 IMPLANT
GOWN STRL REUS W/TWL LRG LVL3 (GOWN DISPOSABLE)
GOWN STRL REUS W/TWL XL LVL3 (GOWN DISPOSABLE) ×3
HANDPIECE INTERPULSE COAX TIP (DISPOSABLE)
KIT BASIN OR (CUSTOM PROCEDURE TRAY) ×1 IMPLANT
KIT TURNOVER KIT B (KITS) ×1 IMPLANT
NS IRRIG 1000ML POUR BTL (IV SOLUTION) ×2 IMPLANT
PACK CHEST (CUSTOM PROCEDURE TRAY) ×1 IMPLANT
PAD ARMBOARD 7.5X6 YLW CONV (MISCELLANEOUS) ×2 IMPLANT
PENCIL BUTTON HOLSTER BLD 10FT (ELECTRODE) IMPLANT
SET HNDPC FAN SPRY TIP SCT (DISPOSABLE) IMPLANT
SPONGE T-LAP 18X18 ~~LOC~~+RFID (SPONGE) ×5 IMPLANT
SPONGE T-LAP 4X18 ~~LOC~~+RFID (SPONGE) ×1 IMPLANT
SUT MNCRL AB 4-0 PS2 18 (SUTURE) ×2 IMPLANT
SUT PERMA SILK 0 CT1 (SUTURE) ×2 IMPLANT
SUT STEEL 6MS V (SUTURE) IMPLANT
SUT STEEL STERNAL CCS#1 18IN (SUTURE) IMPLANT
SUT STEEL SZ 6 DBL 3X14 BALL (SUTURE) IMPLANT
SUT VIC AB 0 CTX 36 (SUTURE)
SUT VIC AB 0 CTX36XBRD ANTBCTR (SUTURE) ×2 IMPLANT
SUT VIC AB 2-0 CT1 27 (SUTURE)
SUT VIC AB 2-0 CT1 TAPERPNT 27 (SUTURE) ×2 IMPLANT
SWAB COLLECTION DEVICE MRSA (MISCELLANEOUS) IMPLANT
SWAB CULTURE ESWAB REG 1ML (MISCELLANEOUS) IMPLANT
SYR 5ML LL (SYRINGE) IMPLANT
TOWEL GREEN STERILE (TOWEL DISPOSABLE) ×1 IMPLANT
TOWEL GREEN STERILE FF (TOWEL DISPOSABLE) ×1 IMPLANT
TRAY FOLEY MTR SLVR 14FR STAT (SET/KITS/TRAYS/PACK) IMPLANT
WATER STERILE IRR 1000ML POUR (IV SOLUTION) ×1 IMPLANT

## 2023-05-28 NOTE — Progress Notes (Signed)
PT Cancellation Note  Patient Details Name: [name] MRN: [id number] DOB: [birth year]   Cancelled Treatment:    Reason Eval/Treat Not Completed: Patient not medically ready (pt s/p OR today with [facility] and RN states hold this date)   Cristine Polio 06/11/2023, 12:26 PM Merryl Hacker, PT Acute Rehabilitation Services Office: [phone]

## 2023-05-28 NOTE — Plan of Care (Signed)
progressing 

## 2023-05-28 NOTE — Progress Notes (Signed)
ANTICOAGULATION CONSULT NOTE-Follow Up  Pharmacy Consult for apixaban > heparin  Indication: Afib/ s/p VSD repair  Allergies  Allergen Reactions   Ibuprofen Other (See Comments)    Pt is unable to take this due to kidney problems.      Penicillins Rash and Other (See Comments)    Tolerated ceftriaxone and cefepime in past Has patient had a PCN reaction causing immediate rash, facial/tongue/throat swelling, SOB or lightheadedness with hypotension: No Has patient had a PCN reaction causing severe rash involving mucus membranes or skin necrosis: No Has patient had a PCN reaction that required hospitalization No Has patient had a PCN reaction occurring within the last 10 years: No If all of the above answers are "NO", then may proceed with Cephalosporin use.    Patient Measurements: Height: 5\' 4"  (162.6 cm) Weight: 99.2 kg (218 lb 11.1 oz) IBW/kg (Calculated) : 54.7 HEPARIN DW (KG): 77.6    Vital Signs: Temp: 98.5 F (36.9 C) (08/05 0930) Temp Source: Oral (08/05 0514) BP: 111/67 (08/05 1004) Pulse Rate: 56 (08/05 1004)  Labs: Recent Labs    05/26/23 0550 05/26/23 1555 05/27/23 0446 05/27/23 0641 06/06/2023 0500  HGB 8.0*  --  7.7*  --  8.4*  HCT 25.9*  --  25.4*  --  28.3*  PLT 636*  --  613*  --  587*  APTT 97*   < > 188* 68* 76*  HEPARINUNFRC 1.07*  --   --   --  0.47  CREATININE 1.27*  --  1.21*  --  1.24*   < > = values in this interval not displayed.    Estimated Creatinine Clearance: 57.3 mL/min (A) (by C-G formula based on SCr of 1.24 mg/dL (H)).   Medical History: Past Medical History:  Diagnosis Date   Chronic cough    Chronic pain    Diabetes mellitus without complication (HCC)    Dyspnea    GERD (gastroesophageal reflux disease)    Hypokalemia    Hypothyroidism    Left wrist pain 06/01/2021   Myonecrosis (HCC) 07/01/2021   Neurocardiogenic syncope    OSA (obstructive sleep apnea)    does not use CPAP   Osteomyelitis (HCC)    bilateral feet    Peripheral neuropathy    Presence of intrathecal pump    recieves Prialt/bupivicaine   Tear of gluteus medius tendon 07/01/2021   Wegener's granulomatosis 2009   Wegner's disease (congenital syphilitic osteochondritis)     Medications:   ceFEPime (MAXIPIME) IV 2 g (06/18/2023 0102)   vancomycin 500 mg (05/27/23 2231)     Assessment: 57 yo female s/p DES to LAD 7/12, s/p IABP >  VSD repair and PAF in SR on amiodarone. Pt with sternal wound dehiscence so apixaban switched to heparin for planned debridements.  Pt s/p OR for debridement today. Heparin stopped preop, ok to resume at 3p today. Heparin level and aPTT both therapeutic this morning and correlating so will stop aPTT checks.  Goal of Therapy:  Heparin level 0.3-0.7 units/ml aPTT 66-102 Monitor platelets by anticoagulation protocol: Yes   Plan:  Resume heparin 1200 units/h no bolus at 1500 Recheck aPTT and heparin level with am labs  Fredonia Highland, PharmD, BCPS, [facility] Clinical Pharmacist [phone] Please check AMION for all [facility] Pharmacy numbers 06/19/2023

## 2023-05-28 NOTE — Op Note (Signed)
CARDIOVASCULAR SURGERY OPERATIVE NOTE  06/17/2023 [name] [id number]  Surgeon:  Ashley Akin, MD  Preoperative Diagnosis:  Sternal Dehiscence  Postoperative Diagnosis:  Same   Procedure: Sternal wound exploration with debridement of sternal edges and removal of 7 sternal wires and placement of a VAC dressing  Anesthesia:  General Endotracheal   Clinical History/Surgical Indication: Pt is sp VSD repair who was found to have increasing sternal pain and a completely disrupted sternum on CT scan. Pt has been on antibiotics and concern for sternal wound infection was also a concern  Findings:  The complete sternum had non union at the manubrium and below that it was apart the entire body of the sternum with multiple fractures from pulled through wires. There was no evidence of loculated purulence at the base but tissues were not healing. Cultures taken  Preparation:  The patient was seen in the preoperative holding area and the correct patient, correct operation were confirmed with the patient after reviewing the medical record and catheterization. The consent was signed by me. Preoperative antibiotics were given. A pulmonary arterial line and radial arterial line were placed by the anesthesia team. The patient was taken back to the operating room and positioned supine on the operating room table. After being placed under general endotracheal anesthesia by the anesthesia team a foley catheter was placed. The neck, chest, abdomen, and both legs were prepped with betadine soap and solution and draped in the usual sterile manner. A surgical time-out was taken and the correct patient and operative procedure were confirmed with the nursing and anesthesia staff.  Operation: Previous sternal incision was reopened and carried down through the subcutaneous tissues.  A large pocket was then entered around the xiphoid region and cultures were taken.  At this point it was evident that there was the  entire body of this during him being dehisced with multiple wires having being pulled through.  The wires were removed and the opening up towards the manubrium the manubrium was also had nonunion and these wires were also removed.  2 areas of necrotic sternum were then removed and copious irrigation of the area was performed.  A 20 cm x 10 cm VAC was then fashioned to fill in the area with an additional 3 x 20 cm piece of foam placed between the remaining sternal edges.  The adhesive dressings were applied and good suction was achieved.  Patient tolerated the procedure well.

## 2023-05-28 NOTE — Progress Notes (Signed)
Initial Nutrition Assessment  DOCUMENTATION CODES:   Obesity unspecified  INTERVENTION:   Add Juven BID, each packet provides 80 calories, 8 grams of carbohydrate, 2.5  grams of protein (collagen), 7 grams of L-arginine and 7 grams of L-glutamine; supplement contains CaHMB, Vitamins C, E, B12 and Zinc to promote wound healing  Add MVI with Minerals daily, 500 mg Vitamin C daily for 30 days and Zinc Sulfate 220 mg daily for 14 days  Pt is not drinking Ensure shakes; plan to discontinue for now  NUTRITION DIAGNOSIS:   Increased nutrient needs related to wound healing, post-op healing as evidenced by estimated needs.  GOAL:   Patient will meet greater than or equal to 90% of their needs  MONITOR:   PO intake, Supplement acceptance, Labs, Weight trends  REASON FOR ASSESSMENT:   Consult Wound healing  ASSESSMENT:   57 yo female admitted with acute anterior STEMI with apical VSD requiring surgical repair on 7/19. Pt developed sternal wound dehiscence requiring debridement and wound VAC. PMH includes DM, GERD, Wegner's Disease  7/14 IABP placed 7/19 Surgical Repair of apical VSD 7/20 IABP removed, extubated 8/01 Dehisced sternum on CT 8/05 OR sternal re-exploration with debridement of sternal edges and removal of 7 sternal wires and wound VAC placement  Noted Plastic Surgery Consulted  NPO this AM for surgery. Limited documentation of po intake since admission but recorded po intake 25-100% with pt reporting improved appetite recently  Pt reports throat is sore this morning after extubation post OR.  Bilateral toe amputations; chronic wounds to both feet  Need tight glucose control to promote wound healing. Noted CBGs 81-322 in last 24 hours; noted insulin regimen adjusted today  Labs: CBGs 81-322, Creatinine 1.24, BUN 21, prealbumin 22 (wdl) Meds: KCl, ss novolog, semglee, lasix   Diet Order:   Diet Order     None       EDUCATION NEEDS:   Education needs  have been addressed  Skin:  Skin Assessment: Skin Integrity Issues: Skin Integrity Issues:: Wound VAC Wound Vac: sternal, plastic surgery consulted  Last BM:  8/5  Height:   Ht Readings from Last 1 Encounters:  06/05/2023 5\' 4"  (1.626 m)    Weight:   Wt Readings from Last 1 Encounters:  05/27/2023 99.2 kg    BMI:  Body mass index is 37.54 kg/m.  Estimated Nutritional Needs:   Kcal:  1800-2000 kcals  Protein:  100-115 g  Fluid:  1.8 L  Romelle Starcher MS, RDN, LDN, CNSC Registered Dietitian 3 Clinical Nutrition RD Pager and On-Call Pager Number Located in [place]

## 2023-05-28 NOTE — Transfer of Care (Signed)
Immediate Anesthesia Transfer of Care Note  Patient: [name]  Procedure(s) Performed: STERNAL RE-EXPLORATION AND DEBRIDEMENT, WOUND VAC PLACEMENT  Patient Location: PACU  Anesthesia Type:General  Level of Consciousness: awake and drowsy  Airway & Oxygen Therapy: Patient Spontanous Breathing  Post-op Assessment: Report given to RN, Post -op Vital signs reviewed and stable, and Patient moving all extremities X 4  Post vital signs: Reviewed and stable  Last Vitals:  Vitals Value Taken Time  BP 126/68   Temp    Pulse 60 06/22/2023 0851  Resp 18 06/12/2023 0851  SpO2 95 % 05/27/2023 0851  Vitals shown include unfiled device data.  Last Pain:  Vitals:   06/19/2023 0514  TempSrc: Oral  PainSc:       Patients Stated Pain Goal: 0 (05/27/23 0615)  Complications: No notable events documented.

## 2023-05-28 NOTE — Anesthesia Postprocedure Evaluation (Signed)
Anesthesia Post Note  Patient: [name]  Procedure(s) Performed: STERNAL RE-EXPLORATION AND DEBRIDEMENT, WOUND VAC PLACEMENT     Patient location during evaluation: PACU Anesthesia Type: General Level of consciousness: awake and alert, patient cooperative and oriented Pain management: pain level controlled Vital Signs Assessment: post-procedure vital signs reviewed and stable Respiratory status: spontaneous breathing, nonlabored ventilation and respiratory function stable Cardiovascular status: blood pressure returned to baseline and stable Postop Assessment: no apparent nausea or vomiting Anesthetic complications: no   No notable events documented.  Last Vitals:  Vitals:   05/25/2023 0915 06/07/2023 0930  BP: 110/80 104/69  Pulse: (!) 55 (!) 55  Resp: 14 14  Temp:  36.9 C  SpO2: 93% 94%    Last Pain:  Vitals:   06/11/2023 0930  TempSrc:   PainSc: Asleep                 ,E. 

## 2023-05-28 NOTE — Interval H&P Note (Signed)
History and Physical Interval Note:  06/14/2023 6:32 AM  [name]  has presented today for surgery, with the diagnosis of STERNAL DEHISCENCE.  The various methods of treatment have been discussed with the patient and family. After consideration of risks, benefits and other options for treatment, the patient has consented to  Procedure(s): STERNAL RE-EXPLORATION AND DEBRIDEMENT, WOUND VAC PLACEMENT (N/A) as a surgical intervention.  The patient's history has been reviewed, patient examined, no change in status, stable for surgery.  I have reviewed the patient's chart and labs.  Questions were answered to the patient's satisfaction.     Eugenio Hoes

## 2023-05-28 NOTE — Progress Notes (Signed)
Pharmacy Antibiotic Note  [name] is a 57 y.o. female admitted on 05/10/2023 with STEMI> PCI, post op VSD repair.   WBC elevated, afebrile cough with yellow sputum. Pharmacy has been consulted for vancomycin and cefepime  dosing. Cr stable, cultures negative. Stop dates in place for 7 day total.  Plan: Vancomycin to 500mg  IV q12h Cefepime 2gm q12h   Height: 5\' 4"  (162.6 cm) Weight: 99.2 kg (218 lb 11.1 oz) IBW/kg (Calculated) : 54.7  Temp (24hrs), Avg:98.3 F (36.8 C), Min:97.9 F (36.6 C), Max:98.5 F (36.9 C)  Recent Labs  Lab 05/24/23 0300 05/25/23 0320 05/25/23 1410 05/26/23 0550 05/27/23 0446 06/16/2023 0500  WBC 18.7* 15.1*  --  17.4* 15.0* 14.8*  CREATININE 1.42* 1.22*  --  1.27* 1.21* 1.24*  VANCOTROUGH  --   --  23*  --   --   --     Estimated Creatinine Clearance: 57.3 mL/min (A) (by C-G formula based on SCr of 1.24 mg/dL (H)).    Allergies  Allergen Reactions   Ibuprofen Other (See Comments)    Pt is unable to take this due to kidney problems.      Penicillins Rash and Other (See Comments)    Tolerated ceftriaxone and cefepime in past Has patient had a PCN reaction causing immediate rash, facial/tongue/throat swelling, SOB or lightheadedness with hypotension: No Has patient had a PCN reaction causing severe rash involving mucus membranes or skin necrosis: No Has patient had a PCN reaction that required hospitalization No Has patient had a PCN reaction occurring within the last 10 years: No If all of the above answers are "NO", then may proceed with Cephalosporin use.      Fredonia Highland, PharmD, BCPS, [facility] Clinical Pharmacist [phone] Please check AMION for all [facility] Pharmacy numbers 06/21/2023

## 2023-05-28 NOTE — Consult Note (Signed)
[facility]  Reason for Consult:Sternal dehiscence Referring Physician: Eugenio Hoes, MD  [name] is an 57 y.o. female.  HPI: Patient is a 57 year old female with a history of diabetes, Wegener's granulomatosis who presented to the ED with an anterior STEMI on 05/17/2023.  Patient had DES placed.  She then subsequently underwent VSD repair on 05/11/2023 with Dr. Cliffton Asters and Dr. Maren Beach.  Patient found to have completely disrupted sternum on CT scan, This a.m. patient underwent sternal wound exploration with debridement of sternal edges and removal of sternal wires with application of wound VAC with CT surgery.  Plastic surgery consulted for assistance with wound management and consideration for flap.  Patient's husband and father at bedside today, she reports she is doing okay. She reports mild surgical site pain. Her husband reports she has a chronic cough which concerns them due to her sternal dehiscence.   Past Medical History:  Diagnosis Date   Chronic cough    Chronic pain    Diabetes mellitus without complication (HCC)    Dyspnea    GERD (gastroesophageal reflux disease)    Hypokalemia    Hypothyroidism    Left wrist pain 06/01/2021   Myonecrosis (HCC) 07/01/2021   Neurocardiogenic syncope    OSA (obstructive sleep apnea)    does not use CPAP   Osteomyelitis (HCC)    bilateral feet   Peripheral neuropathy    Presence of intrathecal pump    recieves Prialt/bupivicaine   Tear of gluteus medius tendon 07/01/2021   Wegener's granulomatosis 2009   Wegner's disease (congenital syphilitic osteochondritis)     Past Surgical History:  Procedure Laterality Date   AMPUTATION Right 10/21/2020   Procedure: Right hallux amputation;  Surgeon: Toni Arthurs, MD;  Location: [facility];  Service: Orthopedics;  Laterality: Right;   AMPUTATION TOE Bilateral 08/12/2020   Procedure: Right 3rd toe amputation; left hallux and 3rd toe amputation;   Surgeon: Toni Arthurs, MD;  Location: [facility];  Service: Orthopedics;  Laterality: Bilateral;    AMPUTATION TOE Right 03/24/2021   Procedure: AMPUTATION TOE;  Surgeon: Toni Arthurs, MD;  [facility];  Service: Orthopedics;  Laterality: Right;   APPENDECTOMY     BACK SURGERY     BIOPSY  03/30/2022   Procedure: BIOPSY;  Surgeon: Tomma Lightning, MD;  Location: [facility];  Service: Pulmonary;;   BRONCHIAL WASHINGS  03/30/2022   Procedure: BRONCHIAL WASHINGS;  Surgeon: Tomma Lightning, MD;  [facility];  Service: Pulmonary;;   CHOLECYSTECTOMY     COLONOSCOPY N/A 06/02/2013   Procedure: COLONOSCOPY;  Surgeon: Graylin Shiver, MD;  Location: [facility]COPY;  Service: Endoscopy;  Laterality: N/A;   CORONARY/GRAFT ACUTE MI REVASCULARIZATION N/A 05/12/2023   Procedure: Coronary/Graft Acute MI Revascularization;  Surgeon: Tonny Bollman, MD;  Location: [facility] INVASIVE CV LAB;  Service: Cardiovascular;  Laterality: N/A;   HERNIA REPAIR  2000   IABP INSERTION N/A 05/02/2023   Procedure: IABP Insertion;  Surgeon: Dolores Patty, MD;  [facility];  Service: Cardiovascular;  Laterality: N/A;   INCISION AND DRAINAGE ABSCESS Left 07/07/2016   Procedure: DEBRIDMENT LEFT THIGH ABSCESS, EXISION ACUTE SKIN RASH LEFT THIGH(1CM LESION);  Surgeon: Claud Kelp, MD;  Location: [facility];  Service: General;  Laterality: Left;   IR FLUORO GUIDE CV LINE RIGHT  04/24/2022   IR IVC FILTER PLMT / S&I /IMG GUID/MOD SED  05/17/2022   IR US GUIDE VASC ACCESS RIGHT  04/24/2022  LEFT HEART CATH AND CORONARY ANGIOGRAPHY N/A 05/03/2023   Procedure: LEFT HEART CATH AND CORONARY ANGIOGRAPHY;  Surgeon: Tonny Bollman, MD;  Location: [facility] INVASIVE CV LAB;  Service: Cardiovascular;  Laterality: N/A;   RIGHT HEART CATH N/A 05/01/2023   Procedure: RIGHT HEART CATH;  Surgeon: Dolores Patty, MD;  [facility];  Service: Cardiovascular;  Laterality: N/A;   SEPTOPLASTY   02/2013   spleen anuyism     splenic aneurysm     TEE WITHOUT CARDIOVERSION N/A 05/11/2023   Procedure: TRANSESOPHAGEAL ECHOCARDIOGRAM;  Surgeon: Corliss Skains, MD;  [facility];  Service: Open Heart Surgery;  Laterality: N/A;   TRANSMETATARSAL AMPUTATION Bilateral 10/21/2020   Procedure: Left transmetatarsal amputation;  Surgeon: Toni Arthurs, MD;  Location: [facility];  Service: Orthopedics;  Laterality: Bilateral;   TRANSMETATARSAL AMPUTATION Left 03/24/2021   Procedure: TRANSMETATARSAL AMPUTATION;  Surgeon: Toni Arthurs, MD;  Location: [facility] OR;  Service: Orthopedics;  Laterality: Left;   VAGINAL HYSTERECTOMY     VIDEO BRONCHOSCOPY Right 03/30/2022   Procedure: VIDEO BRONCHOSCOPY WITH FLUORO;  Surgeon: Tomma Lightning, MD;  [facility];  Service: Pulmonary;  Laterality: Right;  atypical pneumonia   VSD REPAIR N/A 05/11/2023   Procedure: VENTRICULAR SEPTAL DEFECT (VSD) REPAIR;  Surgeon: Corliss Skains, MD;  [facility];  Service: Open Heart Surgery;  Laterality: N/A;    Family History  Problem Relation Age of Onset   Diabetes Mother    Heart disease Mother    Diabetes Father     Social History:  reports that she has never smoked. She has never used smokeless tobacco. She reports current alcohol use. She reports that she does not use drugs.  Allergies:  Allergies  Allergen Reactions   Ibuprofen Other (See Comments)    Pt is unable to take this due to kidney problems.      Penicillins Rash and Other (See Comments)    Tolerated ceftriaxone and cefepime in past Has patient had a PCN reaction causing immediate rash, facial/tongue/throat swelling, SOB or lightheadedness with hypotension: No Has patient had a PCN reaction causing severe rash involving mucus membranes or skin necrosis: No Has patient had a PCN reaction that required hospitalization No Has patient had a PCN reaction occurring within the last 10 years: No If all of the above  answers are "NO", then may proceed with Cephalosporin use.    Medications: I have reviewed the patient's current medications.  Results for orders placed or performed during the hospital encounter of 04/26/2023 (from the past 48 hour(s))  Glucose, capillary     Status: Abnormal   Collection Time: 05/26/23 11:20 AM  Result Value Ref Range   Glucose-Capillary 160 (H) 70 - 99 mg/dL    Comment: Glucose reference range applies only to samples taken after fasting for at least 8 hours.  APTT     Status: Abnormal   Collection Time: 05/26/23  3:55 PM  Result Value Ref Range   aPTT >200 (HH) 24 - 36 seconds    Comment:        IF BASELINE aPTT IS ELEVATED, SUGGEST PATIENT RISK ASSESSMENT BE USED TO DETERMINE APPROPRIATE ANTICOAGULANT THERAPY. REPEATED TO VERIFY CRITICAL RESULT CALLED TO, READ BACK BY AND VERIFIED WITH: MONICA Sharyn Blitz, RN ON [id number] AT 1650 BY SWEETSELL CUSTODIO Performed at [facility], [address], [place], [place] [address]   Glucose, capillary     Status: Abnormal   Collection Time: 05/26/23  4:14 PM  Result Value Ref Range   Glucose-Capillary 209 (H) 70 - 99 mg/dL    Comment: Glucose reference range applies only to samples taken after fasting for at least 8 hours.  APTT     Status: Abnormal   Collection Time: 05/26/23  5:35 PM  Result Value Ref Range   aPTT 84 (H) 24 - 36 seconds    Comment:        IF BASELINE aPTT IS ELEVATED, SUGGEST PATIENT RISK ASSESSMENT BE USED TO DETERMINE APPROPRIATE ANTICOAGULANT THERAPY. Performed at [facility], [address] [place], [place] [address]   Glucose, capillary     Status: Abnormal   Collection Time: 05/26/23  9:03 PM  Result Value Ref Range   Glucose-Capillary 349 (H) 70 - 99 mg/dL    Comment: Glucose reference range applies only to samples taken after fasting for at least 8 hours.   Comment 1 Document in Chart   CBC     Status: Abnormal   Collection Time: 05/27/23  4:46 AM  Result Value Ref Range    WBC 15.0 (H) 4.0 - 10.5 K/uL   RBC 2.82 (L) 3.87 - 5.11 MIL/uL   Hemoglobin 7.7 (L) 12.0 - 15.0 g/dL   HCT 87.5 (L) 64.3 - 32.9 %   MCV 90.1 80.0 - 100.0 fL   MCH 27.3 26.0 - 34.0 pg   MCHC 30.3 30.0 - 36.0 g/dL   RDW 51.8 84.1 - 66.0 %   Platelets 613 (H) 150 - 400 K/uL   nRBC 0.0 0.0 - 0.2 %    Comment: Performed at [facility], [address], [place], [place] [address]  Basic metabolic panel     Status: Abnormal   Collection Time: 05/27/23  4:46 AM  Result Value Ref Range   Sodium 136 135 - 145 mmol/L   Potassium 3.6 3.5 - 5.1 mmol/L   Chloride 102 98 - 111 mmol/L   CO2 26 22 - 32 mmol/L   Glucose, Bld 95 70 - 99 mg/dL    Comment: Glucose reference range applies only to samples taken after fasting for at least 8 hours.   BUN 25 (H) 6 - 20 mg/dL   Creatinine, Ser 0.10 (H) 0.44 - 1.00 mg/dL   Calcium 8.3 (L) 8.9 - 10.3 mg/dL   GFR, Estimated 52 (L) >60 mL/min    Comment: (NOTE) Calculated using the CKD-EPI Creatinine Equation (2021)    Anion gap 8 5 - 15    Comment: Performed at [facility], [address] [place], [place] [address]  Magnesium     Status: None   Collection Time: 05/27/23  4:46 AM  Result Value Ref Range   Magnesium 2.3 1.7 - 2.4 mg/dL    Comment: Performed at [facility], [address], [place], [place] [address]  APTT     Status: Abnormal   Collection Time: 05/27/23  4:46 AM  Result Value Ref Range   aPTT 188 (HH) 24 - 36 seconds    Comment:        IF BASELINE aPTT IS ELEVATED, SUGGEST PATIENT RISK ASSESSMENT BE USED TO DETERMINE APPROPRIATE ANTICOAGULANT THERAPY. REPEATED TO VERIFY CRITICAL RESULT CALLED TO, READ BACK BY AND VERIFIED WITH: APRIL COOPER RN 05/27/23 0556 SGALLOWAY Performed at [facility], [address] [place], [place] [address]   Glucose, capillary     Status: None   Collection Time: 05/27/23  6:01 AM  Result  Value Ref Range   Glucose-Capillary 71 70 - 99 mg/dL    Comment: Glucose reference range  applies only to samples taken after fasting for at least 8 hours.   Comment 1 Document in Chart   APTT     Status: Abnormal   Collection Time: 05/27/23  6:41 AM  Result Value Ref Range   aPTT 68 (H) 24 - 36 seconds    Comment:        IF BASELINE aPTT IS ELEVATED, SUGGEST PATIENT RISK ASSESSMENT BE USED TO DETERMINE APPROPRIATE ANTICOAGULANT THERAPY. Performed at [facility], [address] [place], [place] [address]   Glucose, capillary     Status: Abnormal   Collection Time: 05/27/23 11:12 AM  Result Value Ref Range   Glucose-Capillary 204 (H) 70 - 99 mg/dL    Comment: Glucose reference range applies only to samples taken after fasting for at least 8 hours.  Glucose, capillary     Status: Abnormal   Collection Time: 05/27/23  3:32 PM  Result Value Ref Range   Glucose-Capillary 322 (H) 70 - 99 mg/dL    Comment: Glucose reference range applies only to samples taken after fasting for at least 8 hours.  Type and screen [facility]     Status: None   Collection Time: 05/27/23  8:57 PM  Result Value Ref Range   ABO/RH(D) O POS    Antibody Screen NEG    Sample Expiration      05/30/2023,2359 Performed at [facility] Lab, [address], [place], [place] [address]   Glucose, capillary     Status: Abnormal   Collection Time: 05/27/23  9:15 PM  Result Value Ref Range   Glucose-Capillary 260 (H) 70 - 99 mg/dL    Comment: Glucose reference range applies only to samples taken after fasting for at least 8 hours.   Comment 1 Notify RN   CBC     Status: Abnormal   Collection Time: 06/23/2023  5:00 AM  Result Value Ref Range   WBC 14.8 (H) 4.0 - 10.5 K/uL   RBC 3.04 (L) 3.87 - 5.11 MIL/uL   Hemoglobin 8.4 (L) 12.0 - 15.0 g/dL   HCT 47.8 (L) 29.5 - 62.1 %   MCV 93.1 80.0 - 100.0 fL   MCH 27.6 26.0 - 34.0 pg   MCHC 29.7 (L) 30.0 - 36.0 g/dL   RDW 30.8 (H) 65.7 - 84.6 %   Platelets 587 (H) 150 - 400 K/uL   nRBC 0.0 0.0 - 0.2 %    Comment: Performed at [facility], [address] [place], [place] [address]  Basic metabolic panel     Status: Abnormal   Collection Time: 06/04/2023  5:00 AM  Result Value Ref Range   Sodium 137 135 - 145 mmol/L   Potassium 3.9 3.5 - 5.1 mmol/L   Chloride 100 98 - 111 mmol/L   CO2 24 22 - 32 mmol/L   Glucose, Bld 119 (H) 70 - 99 mg/dL    Comment: Glucose reference range applies only to samples taken after fasting for at least 8 hours.   BUN 21 (H) 6 - 20 mg/dL   Creatinine, Ser 2.84 (H) 0.44 - 1.00 mg/dL   Calcium 8.7 (L) 8.9 - 10.3 mg/dL   GFR, Estimated 51 (L) >60 mL/min    Comment: (NOTE) Calculated using the CKD-EPI Creatinine Equation (2021)    Anion gap 13 5 - 15    Comment:  Performed at [facility], [address] [place], [place] [address]  Magnesium     Status: None   Collection Time: 05/27/2023  5:00 AM  Result Value Ref Range   Magnesium 2.1 1.7 - 2.4 mg/dL    Comment: Performed at [facility], [address], [place], [place] [address]  APTT     Status: Abnormal   Collection Time: 06/03/2023  5:00 AM  Result Value Ref Range   aPTT 76 (H) 24 - 36 seconds    Comment:        IF BASELINE aPTT IS ELEVATED, SUGGEST PATIENT RISK ASSESSMENT BE USED TO DETERMINE APPROPRIATE ANTICOAGULANT THERAPY. Performed at [facility], [address], [place], [place] [address]   Heparin level (unfractionated)     Status: None   Collection Time: 06/17/2023  5:00 AM  Result Value Ref Range   Heparin Unfractionated 0.47 0.30 - 0.70 IU/mL    Comment: (NOTE) The clinical reportable range upper limit is being lowered to >1.10 to align with the FDA approved guidance for the current laboratory assay.  If heparin results are below expected values, and patient dosage has  been confirmed, suggest follow up testing of antithrombin III levels. Performed at [facility], [address], [place], [place] [address]   Glucose, capillary     Status: Abnormal   Collection Time: 06/06/2023  6:19  AM  Result Value Ref Range   Glucose-Capillary 102 (H) 70 - 99 mg/dL    Comment: Glucose reference range applies only to samples taken after fasting for at least 8 hours.   Comment 1 Notify RN   Glucose, capillary     Status: None   Collection Time: 06/12/2023  8:52 AM  Result Value Ref Range   Glucose-Capillary 81 70 - 99 mg/dL    Comment: Glucose reference range applies only to samples taken after fasting for at least 8 hours.    No results found.  Review of Systems  Constitutional:  Positive for malaise/fatigue. Negative for chills and fever.  Respiratory:  Positive for cough.   Cardiovascular:  Positive for chest pain (post surgical) and leg swelling.  Gastrointestinal: Negative.   Neurological: Negative.    Blood pressure 126/68, pulse (!) 59, temperature 98.5 F (36.9 C), resp. rate (!) 22, height 5\' 4"  (1.626 m), weight 99.2 kg, SpO2 95%. Physical Exam Constitutional:      General: She is not in acute distress.    Appearance: She is ill-appearing.  HENT:     Head: Normocephalic and atraumatic.  Chest:       Comments: Sternal wound present, wound vac in place, good seal noted. Mild tenderness noted surrounding sponge. Normal rise and fall of chest. Breathing unlabored.   Wound vac canister with serosanguinous drainage noted.   Musculoskeletal:       Arms:     Comments: BL LE distal foot amputation, bandages present.   Neurological:     Mental Status: She is alert.     Assessment/Plan:  Sternal dehiscence:  Plan for possible OR Wednesday with CT surgery for observation under anesthesia, debridement, possible application of wound matrix, application of wound vac.  Optimize nutritional status with protein, vitamins. Diabetic nutrition consult. Check prealbumin.  Discussed surgical plan with patient and patient's family today. All of their questions were answered to their content. We discussed possibility for pectoralis muscle flap in ~ 1 week pending eval in OR  Wednesday 05/30/23.  NPO after midnight  05/31/2023. DVT ppx per primary. Will need to hold blood thinners if able    Leslee Home, PA-C 05/31/2023, 9:01 AM

## 2023-05-28 NOTE — Anesthesia Procedure Notes (Addendum)
Procedure Name: LMA Insertion Date/Time: 06/12/2023 7:45 AM  Performed by: Nils Pyle, CRNAPre-anesthesia Checklist: Patient identified, Emergency Drugs available, Suction available and Patient being monitored Patient Re-evaluated:Patient Re-evaluated prior to induction Oxygen Delivery Method: Circle System Utilized Preoxygenation: Pre-oxygenation with 100% oxygen Induction Type: IV induction LMA: LMA inserted LMA Size: 4.0 Number of attempts: 1 Placement Confirmation: positive ETCO2 Tube secured with: Tape Dental Injury: Teeth and Oropharynx as per pre-operative assessment

## 2023-05-28 NOTE — Progress Notes (Addendum)
Patient ID: [name], female   DOB: 09-14-1966, 57 y.o.   MRN: [id number]    Advanced Heart Failure Rounding Note   Subjective:    7/14: RHC mean RA 21, PA 50/25 mean 35, mean PCWP 28, CI 1.8 F/2.6 T, QpQs 3.3.  IABP placed.  7/19: Surgical repair of apical VSD.  7/20: IABP removed, extubated.  8/05: Sternal wound exploration with debridement, removal of wires and placement of VAC   Seen in ICU post-op.   Sleeping at time of my arrival. Husband at bedside.  Objective:     Vital Signs:   Temp:  [97.9 F (36.6 C)-98.5 F (36.9 C)] 98.5 F (36.9 C) (08/05 0930) Pulse Rate:  [55-76] 56 (08/05 1045) Resp:  [11-22] 15 (08/05 1045) BP: (100-143)/(60-83) 100/80 (08/05 1045) SpO2:  [93 %-99 %] 95 % (08/05 1045) Weight:  [99.2 kg] 99.2 kg (08/05 0655) Last BM Date : 06/03/2023  Weight change: Filed Weights   05/27/23 0500 05/31/2023 0514 06/14/2023 0655  Weight: 96.3 kg 99.2 kg 99.2 kg    Intake/Output:   Intake/Output Summary (Last 24 hours) at 06/19/2023 1059 Last data filed at 05/26/2023 0845 Gross per 24 hour  Intake 540 ml  Output 1395 ml  Net -855 ml     PHYSICAL EXAM: General: No distress. Lying in bed. HEENT: normal Neck: supple. no JVD. Carotids 2+ bilat; no bruits.  Cor: PMI nondisplaced. Regular rate & rhythm. No rubs, gallops or murmurs. VAC over sternum Lungs: clear Abdomen: obese, soft, nontender, nondistended.  Extremities: no cyanosis, clubbing, rash, edema, b/l TMA Neuro: Sleeping but arrousable    Telemetry: Sinus brady 50s  Labs: Basic Metabolic Panel: Recent Labs  Lab 05/24/23 0300 05/25/23 0320 05/26/23 0550 05/27/23 0446 06/22/2023 0500  NA 131* 132* 132* 136 137  K 3.7 3.6 3.2* 3.6 3.9  CL 91* 93* 95* 102 100  CO2 25 25 25 26 24   GLUCOSE 145* 103* 116* 95 119*  BUN 23* 23* 25* 25* 21*  CREATININE 1.42* 1.22* 1.27* 1.21* 1.24*  CALCIUM 8.3* 8.4* 8.5* 8.3* 8.7*  MG 2.1 2.3 2.4 2.3 2.1    Liver Function Tests: No results for input(s):  "AST", "ALT", "ALKPHOS", "BILITOT", "PROT", "ALBUMIN" in the last 168 hours.   No results for input(s): "LIPASE", "AMYLASE" in the last 168 hours. No results for input(s): "AMMONIA" in the last 168 hours.  CBC: Recent Labs  Lab 05/24/23 0300 05/25/23 0320 05/26/23 0550 05/27/23 0446 06/22/2023 0500  WBC 18.7* 15.1* 17.4* 15.0* 14.8*  HGB 8.6* 8.0* 8.0* 7.7* 8.4*  HCT 28.0* 25.9* 25.9* 25.4* 28.3*  MCV 87.8 88.1 90.6 90.1 93.1  PLT 710* 666* 636* 613* 587*    Cardiac Enzymes: No results for input(s): "CKTOTAL", "CKMB", "CKMBINDEX", "TROPONINI" in the last 168 hours.  BNP: BNP (last 3 results) Recent Labs    05/09/2023 0620 05/23/23 0330  BNP 123.2* 917.9*    ProBNP (last 3 results) No results for input(s): "PROBNP" in the last 8760 hours.    Other results:  Imaging: No results found.   Medications:     Scheduled Medications:  amiodarone  200 mg Oral BID   amitriptyline  50 mg Oral QHS   Chlorhexidine Gluconate Cloth  6 each Topical Daily   chlorpheniramine-HYDROcodone  5 mL Oral BID   clopidogrel  75 mg Oral Daily   DULoxetine  60 mg Oral BID   feeding supplement  237 mL Oral BID BM   fentaNYL  furosemide  40 mg Oral Daily   gabapentin  300 mg Oral BID   insulin aspart  0-9 Units Subcutaneous Q4H   insulin glargine-yfgn  12 Units Subcutaneous BID   levothyroxine  25 mcg Oral Daily   mouth rinse  15 mL Mouth Rinse 4 times per day   pantoprazole  40 mg Oral Daily   potassium chloride  20 mEq Oral Daily   rOPINIRole  0.25 mg Oral BID   rosuvastatin  40 mg Oral Daily   sodium chloride flush  10-40 mL Intracatheter Q12H   spironolactone  25 mg Oral Daily    Infusions:  ceFEPime (MAXIPIME) IV 2 g (06/14/2023 1038)   heparin     vancomycin 500 mg (05/27/23 2231)     PRN Medications: acetaminophen, bisacodyl **OR** [DISCONTINUED] bisacodyl, dextrose, docusate sodium, fentaNYL, guaiFENesin, HYDROmorphone, ipratropium-albuterol, lidocaine,  methocarbamol, morphine injection, nitroGLYCERIN, ondansetron (ZOFRAN) IV, mouth rinse, oxyCODONE, traMADol    Patient Profile    [name] is a 57 y.o. 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 Admitted with STEMI s/p PCI to LAD c/b cardiogenic shock and infarct related apical VSD. S/p VAD repair. Post op course c/b sternal dehiscence.    Assessment/Plan:   Cardiogenic shock s/p VSD repair  - Echo 6/23 EF 55%, no RWMA, normal RV, trivial MR - Echo 7/12,  EF 45% - Echo 7/14: EF 60% with apical VSD, normal RV. Apical VSD creates large shunt by RHC QpQs 3.3.  - TEE 7/15 showed EF 55% with apical akinesis and apical VSD with significant L>R flow, RV mildly dilated and dysfunctional.   - OR 7/19 for surgical repair of apical VSD.  - Volume stable, continue PO Lasix 40 mg daily  - Continue spiro 25 mg daily  - No SGLT2i for now with risk of infection d/t limited mobility - With sinus brady will reduce po amiodarone to 200 mg daily  Anterior STEMI, CAD - HsTrop >24K on admission - s/p PCI to LAD 7/12, post DES 0% residual stenosis. Medical management of 40% stenosed RCA.  - LDL unable to be calculated with high TGD, continue Crestor 40.  - Complicated by infarct-related VSD. Now s/p repair  - On Plavix + statin  DM II - A1c last 7.6 - SSI while admitted per primary - has diabetic foot ulcers.  - ABI 12/22 were normal  4. PAF - in NSR on po amio - Eliquis on hold post-op   5. Obesity - Body mass index is 37 - consider GLP-1 as OP   6. OSA - Using BIPAP at night  7. AKI - Improved. Scr stable at 1.24. - monitor   8. Hyponatremia - Resolved  - Fluid restrict.   9. Anemia - 1 u RBCs on 07/20 - Hgb stable 8.4 - Transfuse Hgb < 7  10. Thrombocytopenia - IABP now out - Resolved  11. Acute hypoxemic respiratory failure - Now off BiPAP - O2 stable, now on  RA - Encourage IS  12. Hypokalemia - resolved, K 3.6 - monitor   13. Dehisced Sternum - noted on CT of chest 8/1 - On Maxipime and Vancomycin - S/p sternal wound exploration and debridement, removal of sternal wires and VAC on 08/05  14. Leukocytosis - improving w/ abx, in setting of sternal wound dehiscence +/- HCAP  - continue abx     FINCH, LINDSAY N PA-C 06/02/2023 10:59 AM  Agree  with above.  Was in OR today for sternal dehisence. Underwent debridement and removal of sternal wires. VAC placed.   Plastics has seen. Planning for possible pec flap.   Denies fevers or chills. Chest sore.   General:  Lying in bed  HEENT: normal Neck: supple. no JVD. Carotids 2+ bilat; no bruits. No lymphadenopathy or thryomegaly appreciated. Cor: wound vac in place. Regular  Lungs: clear Abdomen: obese soft, nontender, nondistended. No hepatosplenomegaly. No bruits or masses. Good bowel sounds. Extremities: no cyanosis, clubbing, rash, edema s/p bilat transmet  Neuro:sleepy but arousable post-op   Case d/w TCTS. Continue wound vac. Off Eliquis on heparin. Continue Plavix.   Appreciate Plastics input. Will need to optimize nutritional status. Continue vanc/cefepime.   CRITICAL CARE Performed by: Arvilla Meres  Total critical care time: 40 minutes  Critical care time was exclusive of separately billable procedures and treating other patients.  Critical care was necessary to treat or prevent imminent or life-threatening deterioration.  Critical care was time spent personally by me (independent of midlevel providers or residents) on the following activities: development of treatment plan with patient and/or surrogate as well as nursing, discussions with consultants, evaluation of patient's response to treatment, examination of patient, obtaining history from patient or surrogate, ordering and performing treatments and interventions, ordering and review of laboratory studies, ordering  and review of radiographic studies, pulse oximetry and re-evaluation of patient's condition.  Arvilla Meres, MD  12:35 PM

## 2023-05-28 NOTE — Progress Notes (Signed)
NAME:  [name], MRN:  [id number], DOB:  21-Jun-1966, LOS: 24 ADMISSION DATE:  05/18/2023, CONSULTATION DATE: 05/11/2023 REFERRING MD: Dr. Cliffton Asters, CHIEF COMPLAINT: Chest pain  History of Present Illness:  57 year old female with a diabetes melitis, complicated with neuropathy and Wegener's granulomatosis who was initially admitted with acute anterior STEMI underwent PCI to LAD, who continues to require vasopressor support and IABP, echocardiogram showed mechanical complication including apical VSD, underwent VSD repair, remained intubated, on multiple vasopressor support, PCCM was consulted for help evaluation medical management  Pertinent  Medical History   Past Medical History:  Diagnosis Date   Chronic cough    Chronic pain    Diabetes mellitus without complication (HCC)    Dyspnea    GERD (gastroesophageal reflux disease)    Hypokalemia    Hypothyroidism    Left wrist pain 06/01/2021   Myonecrosis (HCC) 07/01/2021   Neurocardiogenic syncope    OSA (obstructive sleep apnea)    does not use CPAP   Osteomyelitis (HCC)    bilateral feet   Peripheral neuropathy    Presence of intrathecal pump    recieves Prialt/bupivicaine   Tear of gluteus medius tendon 07/01/2021   Wegener's granulomatosis 2009   Wegner's disease (congenital syphilitic osteochondritis)      Significant Hospital Events: Including procedures, antibiotic start and stop dates in addition to other pertinent events   Repair of small VSD following myocardial infarction.  Interim History / Subjective:  Patient underwent sternal wound exploration and debridement of sternal ages with removal of sternal wires.  Wound VAC was placed  Denies any complaint at this time   Objective   Blood pressure 111/67, pulse (!) 56, temperature 98.5 F (36.9 C), resp. rate 11, height 5\' 4"  (1.626 m), weight 99.2 kg, SpO2 95%. CVP:  [2 mmHg] 2 mmHg      Intake/Output Summary (Last 24 hours) at 06/12/2023 1023 Last data filed at  06/17/2023 0845 Gross per 24 hour  Intake 540 ml  Output 1395 ml  Net -855 ml   Filed Weights   05/27/23 0500 06/16/2023 0514 06/15/2023 0655  Weight: 96.3 kg 99.2 kg 99.2 kg    Examination: General: Middle-aged female, lying on the bed HEENT: [place]/AT, eyes anicteric.  moist mucus membranes Neuro: Alert, awake following commands Chest: Coarse breath sounds, no wheezes or rhonchi.  Wound VAC in place Heart: Regular rate and rhythm, no murmurs or gallops Abdomen: Soft, nontender, nondistended, bowel sounds present Skin: No rash  Labs and images were reviewed   Assessment & Plan:  Coronary artery disease, presented with acute anterior STEMI, complicated with apical VSD. Apical VSD s/p surgical repair Sternal wound dehiscence s/p debridement and wound VAC placement S/p PCI to LAD with DES  Continue Plavix  Continue statin Continue pain control multimodality agents Closely monitor wound VAC   Acute HFrEF S/p IABP removal on 7/20 Patient ejection fraction is 45% Advanced heart failure team is following Continue  spironolactone, GDMT as tolerated  Acute respiratory failure with hypoxia/hypercapnia, due to pulmonary edema Mucous plugging Continue to titrate nasal cannula oxygen Encourage incentive spirometry  Possible HCAP Continue vancomycin and cefepime Cultures have been negative   Acute kidney injury due to ischemic ATN Serum creatinine stable around 1.2-1.4 Avoid nephrotoxic agent Closely monitor electrolytes Serum sodium remained at 131 Serum K remain 5.6 Continue aggressive electrolyte replacement   Diabetes type 2 Patient hemoglobin A1c is 7.6 Continue sliding scale insulin Hold long-acting insulin for this morning as her blood sugar is in  56s   Expected perioperative blood loss anemia H&H and platelet counts are stable now S/p1 unit PRBC few days ago Monitor H&H and platelet count   Morbid obesity OSA Diet and exercise counseling provided BiPAP  overnight   Best Practice (right click and "Reselect all SmartList Selections" daily)   Diet/type: Regular consistency DVT prophylaxis: Systemic heparin GI prophylaxis: PPI Lines: PICC Foley:  Yes, and it is still needed Code Status:  full code Last date of multidisciplinary goals of care discussion [Per primary team]    Cheri Fowler, MD [facility] See Amion for pager If no response to pager, please call [phone] until 7pm After 7pm, Please call E-link [phone]

## 2023-05-28 NOTE — Anesthesia Preprocedure Evaluation (Signed)
Anesthesia Evaluation    Airway Mallampati: II       Dental  (+) Dental Advisory Given   Pulmonary shortness of breath, sleep apnea (does not use CPAP)    breath sounds clear to auscultation       Cardiovascular  Rhythm:Regular Rate:Normal  05/24/2023 ECHO: EF 60-65%, normal LVF, akinesis of the apex and septum  S/P VSD repair   Neuro/Psych  Headaches    GI/Hepatic   Endo/Other  diabetesHypothyroidism  BMI 37.5  Renal/GU Renal InsufficiencyRenal disease     Musculoskeletal   Abdominal  (+) + obese  Peds  Hematology  (+) Blood dyscrasia (Hb 8.4, plt 587K), anemia   Anesthesia Other Findings   Reproductive/Obstetrics                             Anesthesia Physical Anesthesia Plan  ASA: 3  Anesthesia Plan: General   Post-op Pain Management: Minimal or no pain anticipated   Induction: Intravenous  PONV Risk Score and Plan: 3 and Ondansetron, Dexamethasone and Treatment may vary due to age or medical condition  Airway Management Planned: LMA  Additional Equipment: None  Intra-op Plan:   Post-operative Plan:   Informed Consent: I have reviewed the patients History and Physical, chart, labs and discussed the procedure including the risks, benefits and alternatives for the proposed anesthesia with the patient or authorized representative who has indicated his/her understanding and acceptance.     Dental advisory given  Plan Discussed with: CRNA and Surgeon  Anesthesia Plan Comments:        Anesthesia Quick Evaluation

## 2023-05-28 NOTE — Plan of Care (Signed)
  Problem: Education: Goal: Knowledge of General Education information will improve Description: Including pain rating scale, medication(s)/side effects and non-pharmacologic comfort measures Outcome: Progressing   Problem: Health Behavior/Discharge Planning: Goal: Ability to manage health-related needs will improve Outcome: Progressing   Problem: Clinical Measurements: Goal: Ability to maintain clinical measurements within normal limits will improve Outcome: Progressing Goal: Will remain free from infection Outcome: Progressing Goal: Diagnostic test results will improve Outcome: Progressing Goal: Respiratory complications will improve Outcome: Progressing Goal: Cardiovascular complication will be avoided Outcome: Progressing   Problem: Activity: Goal: Risk for activity intolerance will decrease Outcome: Progressing   Problem: Nutrition: Goal: Adequate nutrition will be maintained Outcome: Progressing   Problem: Coping: Goal: Level of anxiety will decrease Outcome: Progressing   Problem: Elimination: Goal: Will not experience complications related to bowel motility Outcome: Progressing Goal: Will not experience complications related to urinary retention Outcome: Progressing   Problem: Pain Managment: Goal: General experience of comfort will improve Outcome: Progressing   Problem: Safety: Goal: Ability to remain free from injury will improve Outcome: Progressing   Problem: Skin Integrity: Goal: Risk for impaired skin integrity will decrease Outcome: Progressing   Problem: Education: Goal: Understanding of CV disease, CV risk reduction, and recovery process will improve Outcome: Progressing Goal: Individualized Educational Video(s) Outcome: Progressing   Problem: Activity: Goal: Ability to return to baseline activity level will improve Outcome: Progressing   Problem: Cardiovascular: Goal: Ability to achieve and maintain adequate cardiovascular perfusion  will improve Outcome: Progressing Goal: Vascular access site(s) Level 0-1 will be maintained Outcome: Progressing   Problem: Health Behavior/Discharge Planning: Goal: Ability to safely manage health-related needs after discharge will improve Outcome: Progressing   Problem: Education: Goal: Ability to describe self-care measures that may prevent or decrease complications (Diabetes Survival Skills Education) will improve Outcome: Progressing Goal: Individualized Educational Video(s) Outcome: Progressing   Problem: Coping: Goal: Ability to adjust to condition or change in health will improve Outcome: Progressing   Problem: Fluid Volume: Goal: Ability to maintain a balanced intake and output will improve Outcome: Progressing   Problem: Health Behavior/Discharge Planning: Goal: Ability to identify and utilize available resources and services will improve Outcome: Progressing Goal: Ability to manage health-related needs will improve Outcome: Progressing   Problem: Metabolic: Goal: Ability to maintain appropriate glucose levels will improve Outcome: Progressing   Problem: Nutritional: Goal: Maintenance of adequate nutrition will improve Outcome: Progressing Goal: Progress toward achieving an optimal weight will improve Outcome: Progressing   Problem: Skin Integrity: Goal: Risk for impaired skin integrity will decrease Outcome: Progressing   Problem: Tissue Perfusion: Goal: Adequacy of tissue perfusion will improve Outcome: Progressing   Problem: Education: Goal: Will demonstrate proper wound care and an understanding of methods to prevent future damage Outcome: Progressing Goal: Knowledge of disease or condition will improve Outcome: Progressing Goal: Knowledge of the prescribed therapeutic regimen will improve Outcome: Progressing Goal: Individualized Educational Video(s) Outcome: Progressing   Problem: Activity: Goal: Risk for activity intolerance will  decrease Outcome: Progressing   Problem: Cardiac: Goal: Will achieve and/or maintain hemodynamic stability Outcome: Progressing

## 2023-05-29 ENCOUNTER — Encounter (HOSPITAL_COMMUNITY): Payer: Self-pay | Admitting: Thoracic Surgery (Cardiothoracic Vascular Surgery)

## 2023-05-29 DIAGNOSIS — J9601 Acute respiratory failure with hypoxia: Secondary | ICD-10-CM | POA: Diagnosis not present

## 2023-05-29 DIAGNOSIS — I469 Cardiac arrest, cause unspecified: Secondary | ICD-10-CM | POA: Diagnosis not present

## 2023-05-29 DIAGNOSIS — I2102 ST elevation (STEMI) myocardial infarction involving left anterior descending coronary artery: Secondary | ICD-10-CM | POA: Diagnosis not present

## 2023-05-29 DIAGNOSIS — I232 Ventricular septal defect as current complication following acute myocardial infarction: Secondary | ICD-10-CM | POA: Diagnosis not present

## 2023-05-29 LAB — BPAM FFP
Blood Product Expiration Date: 202408062359
Blood Product Expiration Date: 202408062359
Blood Product Expiration Date: 202408062359
Blood Product Expiration Date: 202408062359
Blood Product Expiration Date: 202408072359
Blood Product Expiration Date: 202408122359
Blood Product Expiration Date: 202408122359
ISSUE DATE / TIME: 202407301420
ISSUE DATE / TIME: 202407301420
ISSUE DATE / TIME: 202407301420
ISSUE DATE / TIME: 202407301420
ISSUE DATE / TIME: 202407301420
ISSUE DATE / TIME: 202407301420
ISSUE DATE / TIME: 202407301420
Unit Type and Rh: 6200
Unit Type and Rh: 6200
Unit Type and Rh: 6200
Unit Type and Rh: 6200
Unit Type and Rh: 6200
Unit Type and Rh: 6200
Unit Type and Rh: 6200

## 2023-05-29 LAB — PREPARE FRESH FROZEN PLASMA
Unit division: 0
Unit division: 0
Unit division: 0
Unit division: 0
Unit division: 0
Unit division: 0
Unit division: 0

## 2023-05-29 LAB — APTT: aPTT: 53 seconds — ABNORMAL HIGH (ref 24–36)

## 2023-05-29 LAB — PREPARE RBC (CROSSMATCH)

## 2023-05-29 LAB — PROTIME-INR
INR: 1.2 (ref 0.8–1.2)
Prothrombin Time: 15.4 seconds — ABNORMAL HIGH (ref 11.4–15.2)

## 2023-05-29 LAB — GLUCOSE, CAPILLARY
Glucose-Capillary: 174 mg/dL — ABNORMAL HIGH (ref 70–99)
Glucose-Capillary: 118 mg/dL — ABNORMAL HIGH (ref 70–99)
Glucose-Capillary: 122 mg/dL — ABNORMAL HIGH (ref 70–99)
Glucose-Capillary: 114 mg/dL — ABNORMAL HIGH (ref 70–99)

## 2023-05-29 MED ORDER — EPINEPHRINE 1 MG/10ML IJ SOSY
PREFILLED_SYRINGE | INTRAMUSCULAR | Status: AC | PRN
Start: 2023-05-29 — End: 2023-05-29
  Administered 2023-05-29: 1 mg via INTRAVENOUS

## 2023-05-29 MED ORDER — DEXTROMETHORPHAN POLISTIREX ER 30 MG/5ML PO SUER
60.0000 mg | Freq: Two times a day (BID) | ORAL | Status: DC
  Administered 2023-05-29: 60 mg via ORAL
  Filled 2023-05-29 (×2): qty 10

## 2023-05-29 MED ORDER — SODIUM BICARBONATE 8.4 % IV SOLN
INTRAVENOUS | Status: AC | PRN
  Administered 2023-05-29 (×2): 50 meq via INTRAVENOUS

## 2023-05-29 MED ORDER — SILVER NITRATE-POT NITRATE 75-25 % EX MISC
1.0000 | CUTANEOUS | Status: DC | PRN
  Administered 2023-05-29: 1 via TOPICAL

## 2023-05-29 MED ORDER — MORPHINE SULFATE (PF) 2 MG/ML IV SOLN
2.0000 mg | Freq: Once | INTRAVENOUS | Status: AC
  Administered 2023-05-29: 2 mg via INTRAVENOUS

## 2023-05-29 MED ORDER — EPINEPHRINE 1 MG/10ML IJ SOSY
PREFILLED_SYRINGE | INTRAMUSCULAR | Status: AC
  Filled 2023-05-29: qty 60

## 2023-05-29 MED ORDER — ALBUMIN HUMAN 5 % IV SOLN
INTRAVENOUS | Status: AC
  Filled 2023-05-29: qty 250

## 2023-05-29 MED ORDER — EPINEPHRINE HCL 5 MG/250ML IV SOLN IN NS
INTRAVENOUS | Status: AC
  Filled 2023-05-29: qty 250

## 2023-05-29 MED ORDER — EPINEPHRINE 1 MG/10ML IJ SOSY
PREFILLED_SYRINGE | INTRAMUSCULAR | Status: AC | PRN
  Administered 2023-05-29 (×5): 1 mg via INTRAVENOUS

## 2023-05-29 MED ORDER — SODIUM BICARBONATE 8.4 % IV SOLN
INTRAVENOUS | Status: AC
  Filled 2023-05-29: qty 100

## 2023-05-29 MED ORDER — NOREPINEPHRINE 4 MG/250ML-% IV SOLN
INTRAVENOUS | Status: AC
  Filled 2023-05-29: qty 250

## 2023-05-29 MED ORDER — SODIUM CHLORIDE 0.9% IV SOLUTION: Freq: Once | INTRAVENOUS | Status: DC

## 2023-05-29 MED ORDER — EPINEPHRINE 1 MG/10ML IJ SOSY
PREFILLED_SYRINGE | INTRAMUSCULAR | Status: AC | PRN
  Administered 2023-05-29: 1 mg via INTRAVENOUS

## 2023-05-30 SURGERY — DEBRIDEMENT, WOUND, WITH CLOSURE
Anesthesia: Choice | Site: Chest

## 2023-06-01 ENCOUNTER — Telehealth: Payer: BC Managed Care – PPO | Admitting: Thoracic Surgery (Cardiothoracic Vascular Surgery)

## 2023-06-04 ENCOUNTER — Encounter (HOSPITAL_COMMUNITY): Payer: BC Managed Care – PPO

## 2023-06-24 NOTE — Progress Notes (Addendum)
H) 05/26/2023   CHOL 165 05/10/2023   TRIG 401 (H) 05/15/2023   HDL 36 (L) 04/24/2023   LDLDIRECT 65 04/24/2023   LDLCALC UNABLE TO CALCULATE IF TRIGLYCERIDE OVER 400 mg/dL 16/07/9603   ALT 15 54/06/8118   AST 51 (H) 05/11/2023   NA 134 (L) 06/04/2023   K 4.6 06/02/2023   CL 102 06/02/2023   CREATININE 1.25 (H) 06/09/2023   BUN 26 (H) 06/21/2023   CO2 24 06/23/2023   TSH 4.210 01/04/2023   INR 1.5 (H) 05/11/2023   HGBA1C 7.6 (H) 05/12/2023   Results for orders placed or performed during the hospital encounter of 04/29/2023  MRSA Next Gen by PCR, Nasal     Status: None   Collection Time: 05/12/2023  8:20 AM   Specimen: Nasal Mucosa; Nasal Swab  Result Value Ref Range Status   MRSA by PCR Next Gen NOT DETECTED NOT DETECTED Final    Comment: (NOTE) The GeneXpert MRSA  Assay (FDA approved for NASAL specimens only), is one component of a comprehensive MRSA colonization surveillance program. It is not intended to diagnose MRSA infection nor to guide or monitor treatment for MRSA infections. Test performance is not FDA approved in patients less than 62 years old. Performed at [facility], [address] [place], [place] [address]   SARS Coronavirus 2 by RT PCR (hospital order, performed in [facility] lab) *cepheid single result test* Anterior Nasal Swab     Status: None   Collection Time: 05/10/23  6:58 PM   Specimen: Anterior Nasal Swab  Result Value Ref Range Status   SARS Coronavirus 2 by RT PCR NEGATIVE NEGATIVE Final    Comment: Performed at [facility], [address], [place], [place] [address]  Surgical pcr screen     Status: Abnormal   Collection Time: 05/10/23  8:40 PM   Specimen: Nasal Mucosa; Nasal Swab  Result Value Ref Range Status   MRSA, PCR NEGATIVE NEGATIVE Final   Staphylococcus aureus POSITIVE (A) NEGATIVE Final    Comment: (NOTE) The Xpert SA Assay (FDA approved for NASAL specimens in patients 54 years of age and older), is one component of a comprehensive surveillance program. It is not intended to diagnose infection nor to guide or monitor treatment. Performed at [facility], [address], [place], [place] [address]   Culture, blood (Routine X 2) w Reflex to ID Panel     Status: None (Preliminary result)   Collection Time: 05/24/23  5:57 PM   Specimen: BLOOD RIGHT ARM  Result Value Ref Range Status   Specimen Description BLOOD RIGHT ARM  Final   Special Requests   Final    BOTTLES DRAWN AEROBIC AND ANAEROBIC Blood Culture adequate volume   Culture   Final    NO GROWTH 4 DAYS Performed at [facility], [address], [place], [place] [address]    Report Status PENDING  Incomplete  Culture, blood (Routine X 2) w Reflex to ID Panel     Status: None (Preliminary result)   Collection  Time: 05/24/23  5:57 PM   Specimen: BLOOD RIGHT ARM  Result Value Ref Range Status   Specimen Description BLOOD RIGHT ARM  Final   Special Requests   Final    BOTTLES DRAWN AEROBIC AND ANAEROBIC Blood Culture adequate volume   Culture   Final    NO GROWTH 4 DAYS Performed at [facility], [address] [place], [place]  TCTS DAILY ICU PROGRESS NOTE                   [address]            [place] [address]          [phone]   1 Day Post-Op Procedure(s) (LRB): STERNAL RE-EXPLORATION AND DEBRIDEMENT, WOUND VAC PLACEMENT (N/A)  Total Length of Stay:  LOS: 25 days   Subjective: Moderate pain   Objective: Vital signs in last 24 hours: Temp:  [98 F (36.7 C)-98.5 F (36.9 C)] 98 F (36.7 C) (08/05 1638) Pulse Rate:  [55-68] 59 (08/06 0600) Cardiac Rhythm: Normal sinus rhythm (08/05 2000) Resp:  [10-22] 10 (08/06 0600) BP: (94-149)/(54-128) 112/81 (08/06 0600) SpO2:  [93 %-99 %] 98 % (08/06 0600) Weight:  [96.7 kg] 96.7 kg (08/06 0500)  Filed Weights   06/09/2023 0514 06/10/2023 0655 05/24/2023 0500  Weight: 99.2 kg 99.2 kg 96.7 kg    Weight change: -2.5 kg   Hemodynamic parameters for last 24 hours:    Intake/Output from previous day: 08/05 0701 - 08/06 0700 In: 1814.1 [P.O.:520; I.V.:494.1; IV Piggyback:799.9] Out: 3745 [Urine:3150; Drains:575; Blood:20]  Intake/Output this shift: No intake/output data recorded.  Current Meds: Scheduled Meds:  amiodarone  200 mg Oral Daily   amitriptyline  50 mg Oral QHS   ascorbic acid  500 mg Oral Daily   Chlorhexidine Gluconate Cloth  6 each Topical Daily   chlorpheniramine-HYDROcodone  5 mL Oral BID   clopidogrel  75 mg Oral Daily   DULoxetine  60 mg Oral BID   furosemide  40 mg Oral Daily   gabapentin  300 mg Oral BID   insulin aspart  0-9 Units Subcutaneous Q4H   insulin glargine-yfgn  12 Units Subcutaneous BID   levothyroxine  25 mcg Oral Daily   multivitamin with minerals  1 tablet Oral Daily   nutrition supplement (JUVEN)  1 packet Oral BID BM   mouth rinse  15 mL Mouth Rinse 4 times per day   pantoprazole  40 mg Oral Daily   potassium chloride  20 mEq Oral Daily   rOPINIRole  0.25 mg Oral BID   rosuvastatin  40 mg Oral Daily   sodium chloride flush  10-40 mL Intracatheter Q12H   spironolactone  25 mg Oral  Daily   zinc sulfate  220 mg Oral Daily   Continuous Infusions:  ceFEPime (MAXIPIME) IV Stopped (05/26/2023 2215)   heparin 1,200 Units/hr (06/09/2023 0600)   vancomycin Stopped (05/30/2023 0005)   PRN Meds:.acetaminophen, bisacodyl **OR** [DISCONTINUED] bisacodyl, dextrose, docusate sodium, guaiFENesin, HYDROmorphone, ipratropium-albuterol, lidocaine, methocarbamol, morphine injection, nitroGLYCERIN, ondansetron (ZOFRAN) IV, mouth rinse, oxyCODONE, traMADol  General appearance: alert, cooperative, and no distress Heart: regular rate and rhythm Lungs: clear anteriorly Abdomen: benign Extremities: no edema Wound: VAC in place, bloody drainage  Lab Results: CBC: Recent Labs    06/16/2023 0500 06/21/2023 0248  WBC 14.8* 12.2*  HGB 8.4* 8.1*  HCT 28.3* 27.7*  PLT 587* 486*   BMET:  Recent Labs    05/26/2023 0500 05/24/2023 0248  NA 137 134*  K 3.9 4.6  CL 100 102  CO2 24 24  GLUCOSE 119* 150*  BUN 21* 26*  CREATININE 1.24* 1.25*  CALCIUM 8.7* 8.4*    CMET: Lab Results  Component Value Date   WBC 12.2 (H) 06/11/2023   HGB 8.1 (L) 06/07/2023   HCT 27.7 (L) 06/18/2023   PLT 486 (H) 05/25/2023   GLUCOSE 150 (  TCTS DAILY ICU PROGRESS NOTE                   [address]            [place] [address]          [phone]   1 Day Post-Op Procedure(s) (LRB): STERNAL RE-EXPLORATION AND DEBRIDEMENT, WOUND VAC PLACEMENT (N/A)  Total Length of Stay:  LOS: 25 days   Subjective: Moderate pain   Objective: Vital signs in last 24 hours: Temp:  [98 F (36.7 C)-98.5 F (36.9 C)] 98 F (36.7 C) (08/05 1638) Pulse Rate:  [55-68] 59 (08/06 0600) Cardiac Rhythm: Normal sinus rhythm (08/05 2000) Resp:  [10-22] 10 (08/06 0600) BP: (94-149)/(54-128) 112/81 (08/06 0600) SpO2:  [93 %-99 %] 98 % (08/06 0600) Weight:  [96.7 kg] 96.7 kg (08/06 0500)  Filed Weights   06/09/2023 0514 06/10/2023 0655 05/24/2023 0500  Weight: 99.2 kg 99.2 kg 96.7 kg    Weight change: -2.5 kg   Hemodynamic parameters for last 24 hours:    Intake/Output from previous day: 08/05 0701 - 08/06 0700 In: 1814.1 [P.O.:520; I.V.:494.1; IV Piggyback:799.9] Out: 3745 [Urine:3150; Drains:575; Blood:20]  Intake/Output this shift: No intake/output data recorded.  Current Meds: Scheduled Meds:  amiodarone  200 mg Oral Daily   amitriptyline  50 mg Oral QHS   ascorbic acid  500 mg Oral Daily   Chlorhexidine Gluconate Cloth  6 each Topical Daily   chlorpheniramine-HYDROcodone  5 mL Oral BID   clopidogrel  75 mg Oral Daily   DULoxetine  60 mg Oral BID   furosemide  40 mg Oral Daily   gabapentin  300 mg Oral BID   insulin aspart  0-9 Units Subcutaneous Q4H   insulin glargine-yfgn  12 Units Subcutaneous BID   levothyroxine  25 mcg Oral Daily   multivitamin with minerals  1 tablet Oral Daily   nutrition supplement (JUVEN)  1 packet Oral BID BM   mouth rinse  15 mL Mouth Rinse 4 times per day   pantoprazole  40 mg Oral Daily   potassium chloride  20 mEq Oral Daily   rOPINIRole  0.25 mg Oral BID   rosuvastatin  40 mg Oral Daily   sodium chloride flush  10-40 mL Intracatheter Q12H   spironolactone  25 mg Oral  Daily   zinc sulfate  220 mg Oral Daily   Continuous Infusions:  ceFEPime (MAXIPIME) IV Stopped (05/26/2023 2215)   heparin 1,200 Units/hr (06/09/2023 0600)   vancomycin Stopped (05/30/2023 0005)   PRN Meds:.acetaminophen, bisacodyl **OR** [DISCONTINUED] bisacodyl, dextrose, docusate sodium, guaiFENesin, HYDROmorphone, ipratropium-albuterol, lidocaine, methocarbamol, morphine injection, nitroGLYCERIN, ondansetron (ZOFRAN) IV, mouth rinse, oxyCODONE, traMADol  General appearance: alert, cooperative, and no distress Heart: regular rate and rhythm Lungs: clear anteriorly Abdomen: benign Extremities: no edema Wound: VAC in place, bloody drainage  Lab Results: CBC: Recent Labs    06/16/2023 0500 06/21/2023 0248  WBC 14.8* 12.2*  HGB 8.4* 8.1*  HCT 28.3* 27.7*  PLT 587* 486*   BMET:  Recent Labs    05/26/2023 0500 05/24/2023 0248  NA 137 134*  K 3.9 4.6  CL 100 102  CO2 24 24  GLUCOSE 119* 150*  BUN 21* 26*  CREATININE 1.24* 1.25*  CALCIUM 8.7* 8.4*    CMET: Lab Results  Component Value Date   WBC 12.2 (H) 06/11/2023   HGB 8.1 (L) 06/07/2023   HCT 27.7 (L) 06/18/2023   PLT 486 (H) 05/25/2023   GLUCOSE 150 (

## 2023-06-24 NOTE — Progress Notes (Signed)
While exiting another patient room heard the Wound Vac alarming.  Entered [id number] to address wound vac status to find the patient on the bedpan, aspirating food, bradycardic and bleeding from the wound vac site.  Called code see code narrator.  Patient expired at 1304 pronounced by MD.

## 2023-06-24 NOTE — Progress Notes (Signed)
06/18/2023 1427  Spiritual Encounters  Type of Visit Initial  Care provided to: Pt and family  Conversation partners present during encounter Nurse;Physician  Referral source Code page  Reason for visit Code  OnCall Visit No   Chaplain responded to Code Blue and there was initially no family present.  Family was located in the cafeteria and chaplain established relationship of care with them through compassionate speech and touch. Chaplain stayed with Pt's husband and Pt's father as Code Blue progressed and offered prayer as a comforting intervention. When medical team informed family that Pt has died, chaplain remained with family to help comfort them in their grief, practicing compassionate touch, words of affirmation, and prayer.  Chaplain accompanied family into the room to view Pt after the nursing staff prepared the body for viewing.  Chaplain remained until Pt's pastor arrived.  Chaplain services remain available by Spiritual Consult or for emergent cases, paging [phone]  Chaplain Raelene Bott, MDiv [email or web address] [phone]

## 2023-06-24 NOTE — Procedures (Signed)
     [address]       [place] [address]             [phone]       Procedure: VAC dressing change Indication: Bleeding Surgeon: Leafy Ro Findings: Skin edge with bleeder and also mediastinal tissue with mid bleeding Description: Sterile conditions the dressing removed and clot removed from surface and skin bleeder noted and also tissues between sternal edges controlled with AgNO3. VAC dressing replaced.  Pt tolerated procedure well Morphine  4mg  given.

## 2023-06-24 NOTE — Progress Notes (Addendum)
Patient ID: Giannah Kuilan, female   DOB: 08/22/1966, 57 y.o.   MRN: [id number]    Advanced Heart Failure Rounding Note   Subjective:    7/14: RHC mean RA 21, PA 50/25 mean 35, mean PCWP 28, CI 1.8 F/2.6 T, QpQs 3.3.  IABP placed.  7/19: Surgical repair of apical VSD.  7/20: IABP removed, extubated.  8/05: Sternal wound exploration with debridement, removal of wires and placement of VAC. Amio cut back to 200 mg daily.    Hgb 8.4>8.1  Coughing over night. Increased drainage from VAC 300 cc bloody exudate.  Complaining of chest discomfort.     Objective:     Vital Signs:   Temp:  [98 F (36.7 C)-98.5 F (36.9 C)] 98 F (36.7 C) (08/05 1638) Pulse Rate:  [55-68] 59 (08/06 0600) Resp:  [10-22] 10 (08/06 0600) BP: (94-149)/(54-128) 112/81 (08/06 0600) SpO2:  [93 %-99 %] 98 % (08/06 0600) Weight:  [96.7 kg] 96.7 kg (08/06 0500) Last BM Date : 06/20/2023  Weight change: Filed Weights   06/10/2023 0514 05/27/2023 0655 06/10/2023 0500  Weight: 99.2 kg 99.2 kg 96.7 kg    Intake/Output:   Intake/Output Summary (Last 24 hours) at 06/22/2023 0657 Last data filed at 06/08/2023 0630 Gross per 24 hour  Intake 1814.05 ml  Output 3745 ml  Net -1930.95 ml  CVP 7    PHYSICAL EXAM: General: In bed.  Neck: supple. JVP 6-7 . Carotids 2+ bilat; no bruits. No lymphadenopathy or thryomegaly appreciated. Cor: PMI nondisplaced. Regular rate & rhythm. No rubs, gallops or murmurs. Sternal wound/VAC with bloody exudate.  Lungs: clear Abdomen: soft, nontender, nondistended. No hepatosplenomegaly. No bruits or masses. Good bowel sounds. Extremities: no cyanosis, clubbing, rash, edema. RUE PICC Neuro: alert & orientedx3, cranial nerves grossly intact. moves all 4 extremities w/o difficulty. Affect flat    Telemetry:SR  60s personally checked.  Labs: Basic Metabolic Panel: Recent Labs  Lab 05/25/23 0320 05/26/23 0550 05/27/23 0446 06/14/2023 0500 06/13/2023 0248  NA 132* 132* 136 137 134*  K 3.6 3.2*  3.6 3.9 4.6  CL 93* 95* 102 100 102  CO2 25 25 26 24 24   GLUCOSE 103* 116* 95 119* 150*  BUN 23* 25* 25* 21* 26*  CREATININE 1.22* 1.27* 1.21* 1.24* 1.25*  CALCIUM 8.4* 8.5* 8.3* 8.7* 8.4*  MG 2.3 2.4 2.3 2.1 2.0    Liver Function Tests: No results for input(s): "AST", "ALT", "ALKPHOS", "BILITOT", "PROT", "ALBUMIN" in the last 168 hours.   No results for input(s): "LIPASE", "AMYLASE" in the last 168 hours. No results for input(s): "AMMONIA" in the last 168 hours.  CBC: Recent Labs  Lab 05/25/23 0320 05/26/23 0550 05/27/23 0446 06/23/2023 0500 05/25/2023 0248  WBC 15.1* 17.4* 15.0* 14.8* 12.2*  HGB 8.0* 8.0* 7.7* 8.4* 8.1*  HCT 25.9* 25.9* 25.4* 28.3* 27.7*  MCV 88.1 90.6 90.1 93.1 92.3  PLT 666* 636* 613* 587* 486*    Cardiac Enzymes: No results for input(s): "CKTOTAL", "CKMB", "CKMBINDEX", "TROPONINI" in the last 168 hours.  BNP: BNP (last 3 results) Recent Labs    05/21/2023 0620 05/23/23 0330  BNP 123.2* 917.9*    ProBNP (last 3 results) No results for input(s): "PROBNP" in the last 8760 hours.    Other results:  Imaging: DG CHEST PORT 1 VIEW  Result Date: 06/17/2023 CLINICAL DATA:  [id number] Sternal wound dehiscence [id number] EXAM: PORTABLE CHEST 1 VIEW COMPARISON:  05/24/2023 FINDINGS: Previously seen sternotomy wires have been removed. Stable cardiomediastinal contours. Slightly low lung  volumes. No focal airspace consolidation, pleural effusion, or pneumothorax. Left PICC line in stable positioning. IMPRESSION: Low lung volumes. No acute cardiopulmonary findings. Electronically Signed   By: Duanne Guess D.O.   On: 06/07/2023 14:16     Medications:     Scheduled Medications:  amiodarone  200 mg Oral Daily   amitriptyline  50 mg Oral QHS   ascorbic acid  500 mg Oral Daily   Chlorhexidine Gluconate Cloth  6 each Topical Daily   chlorpheniramine-HYDROcodone  5 mL Oral BID   clopidogrel  75 mg Oral Daily   DULoxetine  60 mg Oral BID   furosemide  40 mg  Oral Daily   gabapentin  300 mg Oral BID   insulin aspart  0-9 Units Subcutaneous Q4H   insulin glargine-yfgn  12 Units Subcutaneous BID   levothyroxine  25 mcg Oral Daily   multivitamin with minerals  1 tablet Oral Daily   nutrition supplement (JUVEN)  1 packet Oral BID BM   mouth rinse  15 mL Mouth Rinse 4 times per day   pantoprazole  40 mg Oral Daily   potassium chloride  20 mEq Oral Daily   rOPINIRole  0.25 mg Oral BID   rosuvastatin  40 mg Oral Daily   sodium chloride flush  10-40 mL Intracatheter Q12H   spironolactone  25 mg Oral Daily   zinc sulfate  220 mg Oral Daily    Infusions:  ceFEPime (MAXIPIME) IV Stopped (06/12/2023 2215)   heparin 1,200 Units/hr (06/12/2023 0600)   vancomycin Stopped (06/01/2023 0005)     PRN Medications: acetaminophen, bisacodyl **OR** [DISCONTINUED] bisacodyl, dextrose, docusate sodium, guaiFENesin, HYDROmorphone, ipratropium-albuterol, lidocaine, methocarbamol, morphine injection, nitroGLYCERIN, ondansetron (ZOFRAN) IV, mouth rinse, oxyCODONE, traMADol    Patient Profile    Sonya Fegely is a 57 y.o. 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 Admitted with STEMI s/p PCI to LAD c/b cardiogenic shock and infarct related apical VSD. S/p VAD repair. Post op course c/b sternal dehiscence.    Assessment/Plan:   Cardiogenic shock s/p VSD repair  - Echo 6/23 EF 55%, no RWMA, normal RV, trivial MR - Echo 7/12,  EF 45% - Echo 7/14: EF 60% with apical VSD, normal RV. Apical VSD creates large shunt by RHC QpQs 3.3.  - TEE 7/15 showed EF 55% with apical akinesis and apical VSD with significant L>R flow, RV mildly dilated and dysfunctional.   - OR 7/19 for surgical repair of apical VSD. - CVP 6-7. Volume status stable. Continue PO Lasix 40 mg daily  - Continue spiro 25 mg daily  - No SGLT2i for now with risk of infection d/t limited mobility -  Continue amiodarone to 200 mg daily  Anterior STEMI, CAD - HsTrop >24K on admission - s/p PCI to LAD 7/12, post DES 0% residual stenosis. Medical management of 40% stenosed RCA.  - LDL unable to be calculated with high TGD, continue Crestor 40.  - Complicated by infarct-related VSD. Now s/p repair  - On Plavix + statin  DM II - A1c last 7.6 - SSI while admitted per primary - has diabetic foot ulcers.  - ABI 12/22 were normal  4. PAF - Maintaining SR. Continue amio - Eliquis on hold post-op - on Heparin drip. May need to hold with bleeding.    5. Obesity - Body mass index is 37 - consider GLP-1 as OP   6. OSA - Using  BIPAP at night  7. AKI - Improved. Scr stable at 1.25  - monitor   8. Hyponatremia - Resolved  - Fluid restrict.   9. Anemia - 1 u RBCs on 07/20 - Hgb stable 8.1 - Transfuse Hgb < 7  10. Thrombocytopenia - IABP now out - Resolved  11. Acute hypoxemic respiratory failure - Now off BiPAP - O2 stable, on 2 liters [place].  - Encourage IS  12. Hypokalemia - resolved, K 4.6  - monitor   13. Dehisced Sternum - noted on CT of chest 8/1 - On Maxipime and Vancomycin - S/p sternal wound exploration and debridement, removal of sternal wires and VAC on 08/05 - Plastic Reconstruction Team following. Plan for surgery debridement tomorrow.  - Increased  bloody exudate from sternal wound.  -CT surgery aware. Watch closely. May need to hold Heparin drip.   14. Leukocytosis - improving w/ abx, in setting of sternal wound dehiscence +/- HCAP  - continue abx     Amy Clegg NP-C  06/20/2023 6:57 AM  Agree with above.  Bleeding from sternal wound vac overnight after coughing spell. Chest sore.   General:  Weak appearing. No resp difficulty HEENT: normal Neck: supple. no JVD. Carotids 2+ bilat; no bruits. No lymphadenopathy or thryomegaly appreciated. Cor: + wound vac with bloody drainage Lungs: clear Abdomen: soft, nontender, nondistended. No  hepatosplenomegaly. No bruits or masses. Good bowel sounds. Extremities: no cyanosis, clubbing, rash, edema s/p bilat TMAs Neuro: alert & orientedx3, cranial nerves grossly intact. moves all 4 extremities w/o difficulty. Affect pleasant  Now with sternal wound dehisence. Wound vac in place. Continue broad spectrum abx. Back to OR tomorrow. Possible PEC Flap next week. HF stable on current regimen. D/w TCTS personally.   CRITICAL CARE Performed by: Arvilla Meres  Total critical care time: 40  minutes  Critical care time was exclusive of separately billable procedures and treating other patients.  Critical care was necessary to treat or prevent imminent or life-threatening deterioration.  Critical care was time spent personally by me (independent of midlevel providers or residents) on the following activities: development of treatment plan with patient and/or surrogate as well as nursing, discussions with consultants, evaluation of patient's response to treatment, examination of patient, obtaining history from patient or surrogate, ordering and performing treatments and interventions, ordering and review of laboratory studies, ordering and review of radiographic studies, pulse oximetry and re-evaluation of patient's condition.  Arvilla Meres, MD  9:35 AM

## 2023-06-24 NOTE — Progress Notes (Addendum)
ANTICOAGULATION CONSULT NOTE-Follow Up  Pharmacy Consult for apixaban > heparin  Indication: Afib/ s/p VSD repair  Allergies  Allergen Reactions   Ibuprofen Other (See Comments)    Pt is unable to take this due to kidney problems.      Penicillins Rash and Other (See Comments)    Tolerated ceftriaxone and cefepime in past Has patient had a PCN reaction causing immediate rash, facial/tongue/throat swelling, SOB or lightheadedness with hypotension: No Has patient had a PCN reaction causing severe rash involving mucus membranes or skin necrosis: No Has patient had a PCN reaction that required hospitalization No Has patient had a PCN reaction occurring within the last 10 years: No If all of the above answers are "NO", then may proceed with Cephalosporin use.    Patient Measurements: Height: 5\' 4"  (162.6 cm) Weight: 96.7 kg (213 lb 3 oz) IBW/kg (Calculated) : 54.7 HEPARIN DW (KG): 77.6    Vital Signs: BP: 112/81 (08/06 0600) Pulse Rate: 59 (08/06 0600)  Labs: Recent Labs    05/27/23 0446 05/27/23 0641 06/11/2023 0500 06/05/2023 0248  HGB 7.7*  --  8.4* 8.1*  HCT 25.4*  --  28.3* 27.7*  PLT 613*  --  587* 486*  APTT 188* 68* 76*  --   HEPARINUNFRC  --   --  0.47 0.37  CREATININE 1.21*  --  1.24* 1.25*    Estimated Creatinine Clearance: 56 mL/min (A) (by C-G formula based on SCr of 1.25 mg/dL (H)).   Medical History: Past Medical History:  Diagnosis Date   Chronic cough    Chronic pain    Diabetes mellitus without complication (HCC)    Dyspnea    GERD (gastroesophageal reflux disease)    Hypokalemia    Hypothyroidism    Left wrist pain 06/01/2021   Myonecrosis (HCC) 07/01/2021   Neurocardiogenic syncope    OSA (obstructive sleep apnea)    does not use CPAP   Osteomyelitis (HCC)    bilateral feet   Peripheral neuropathy    Presence of intrathecal pump    recieves Prialt/bupivicaine   Tear of gluteus medius tendon 07/01/2021   Wegener's granulomatosis 2009    Wegner's disease (congenital syphilitic osteochondritis)     Medications:   ceFEPime (MAXIPIME) IV Stopped (05/30/2023 2215)   heparin 1,200 Units/hr (06/11/2023 0600)   vancomycin Stopped (06/20/2023 0005)     Assessment: 57 yo female s/p DES to LAD 7/12, s/p IABP >  VSD repair and PAF in SR on amiodarone. Pt with sternal wound dehiscence so apixaban switched to heparin for planned debridements.  Heparin level therapeutic 0.37, CBC stable. Wound VAC drainage overnight noted, TCTS aware.  Goal of Therapy:  Heparin level 0.3-0.7 units/ml Monitor platelets by anticoagulation protocol: Yes   Plan:  Continue heparin 1200 units/h  Daily heparin level and CBC  ADDENDUM: heparin stopped with copious bleeding from wound vac.  Fredonia Highland, PharmD, BCPS, [facility] Clinical Pharmacist [phone] Please check AMION for all [facility] Pharm[facility] numbers 06/16/2023

## 2023-06-24 NOTE — Progress Notes (Signed)
  Called to bedside for Code Blue.   Patient with open chest due to sternal dehisence. She had coughing episode with frank aspiration. Developed PEA arrest.   Dr. Merrily Pew at bedside as first responder. CPR in progress. Intubated emergently. Copious food products in airway.   On my arrival in refractory arrest despite excellent CPR, epi and bicarb.. Asystolic on monitor.  Wound vac bulging tensely.   I removed wound vac due to concern for tamponade. We suction nearly 1L of blood from chest. Unable to get my hand around the heart for cardiac massage.  Dr. Dorris Fetch from TCTS arrived shortly thereafter. Sternal retractor placed. Immediate evidence of RV rupture with large defect.   Patients condition felt to be unrecoverable. . Code called at 104p. Family notified at bedside.   CCT 50 mins.   Arvilla Meres, MD  1:44 PM

## 2023-06-24 NOTE — Code Documentation (Addendum)
Compressions stopped/restarted per MD Bensimhon and Hendrickson at bedside due to open chest.

## 2023-06-24 NOTE — Death Summary Note (Signed)
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 She had a DES placed to the LAD on 2023-05-23, and is currently on brilinta. Echo was reviewed, and the VSD is very apical. She remains symptomatic with some shortness of breath, and chest pain. I would like to discuss with structural team to see if she is a candidate for a percutaneous closure device. If not, we would need to consider open repair, which will be difficult given her new stent. We also would need to consider options for mechanical support if she develops worsening LV failure after open repair.   Diagnoses  Preliminary cause of death:  Secondary Diagnoses (including complications and co-morbidities):  Principal Problem:   ST elevation myocardial infarction involving left anterior descending (LAD) coronary artery [facility]) Active Problems:   Ventricular septal defect as complication of acute myocardial infarction [facility])   Cardiac arrest [facility])   Brief Hospital Course (including significant findings, care, treatment, and services provided and events leading to death)  [name] is a 57 y.o. year old female who was admitted with a post infarct VSD.  She underwent surgical repair on 7/19 and progressed well from this.  She was transferred to the floor on POD 10.  While on the floor, she developed new onset chest pain, and was noted to have a malpositioned sternal wire, and subsequently taken to the OR for sternal wire removal.  She was then transferred back to the ICU.  On 06/17/2023, she began vomiting and has an aspiration event, and then went into PEA.  While performing chest compressions, she likely sustained an RV laceration and subsequently died  from hypovolemic shock.    Pertinent Labs and Studies  Significant Diagnostic Studies DG CHEST PORT 1 VIEW  Result Date: 06/01/2023 CLINICAL DATA:  [id number] Sternal wound dehiscence [id number] EXAM: PORTABLE CHEST 1 VIEW COMPARISON:  05/24/2023 FINDINGS: Previously seen sternotomy wires have been removed. Stable cardiomediastinal contours. Slightly low lung volumes. No focal airspace consolidation, pleural effusion, or pneumothorax. Left PICC line in stable positioning. IMPRESSION: Low lung volumes. No acute cardiopulmonary findings. Electronically Signed   By: Duanne Guess D.O.   On: 06/06/2023 14:16   CT CHEST ABDOMEN PELVIS W CONTRAST  Result Date: 05/24/2023 CLINICAL DATA:  Sepsis, VSD repair EXAM: CT CHEST, ABDOMEN, AND PELVIS WITH CONTRAST TECHNIQUE: Multidetector CT imaging of the chest, abdomen and pelvis was performed following the standard protocol during bolus administration of intravenous contrast. RADIATION DOSE REDUCTION: This exam was performed according to the departmental dose-optimization program which includes automated exposure control, adjustment of the mA and/or kV according to patient size and/or use of iterative reconstruction technique. CONTRAST:  75mL OMNIPAQUE IOHEXOL 350 MG/ML SOLN COMPARISON:  05/20/2023, 05/24/2023 FINDINGS: CT CHEST FINDINGS Cardiovascular: Radiopaque patch involving the distal margin of the interventricular septum extends into the pericardium, stable since prior study. Right-sided pericardial fluid is again identified measuring up to 17 mm in thickness, slightly increased since prior study where it had measured up to 15 mm in thickness. This fluid is contiguous with fluid along the inferior margin of the sternotomy and within the subcutaneous tissues, where there is a 3.0 x 2.0 cm fluid collection image 43/3. Fluid extends through the  subcutaneous fat along the tracks of presumed prior indwelling surgical drains. There is mild rim enhancement of the  pericardial fluid, but no rim enhancement of the subcutaneous fluid collection. Underlying infection cannot be excluded. No evidence of thoracic aortic aneurysm or dissection. Stable atherosclerosis. Left-sided central venous catheter tip within the superior vena cava. Mediastinum/Nodes: Postsurgical changes from median sternotomy. There is fat stranding in the anterior mediastinum and subcutaneous tissues consistent with recent surgical intervention. Please see discussion above regarding pericardial and subcutaneous fluid collections. Thyroid, trachea, and esophagus are unremarkable. No pathologic adenopathy. Lungs/Pleura: Trace bilateral pleural effusions with dependent atelectasis. No airspace disease or pneumothorax. Central airways are patent. Musculoskeletal: No acute displaced fracture. Reconstructed images demonstrate no additional findings. CT ABDOMEN PELVIS FINDINGS Hepatobiliary: No focal liver abnormality is seen. Status post cholecystectomy. No biliary dilatation. Pancreas: Unremarkable. No pancreatic ductal dilatation or surrounding inflammatory changes. Spleen: Stable splenic scarring.  No focal parenchymal abnormality. Adrenals/Urinary Tract: Adrenal glands are unremarkable. Kidneys are normal, without renal calculi, focal lesion, or hydronephrosis. Bladder is unremarkable. Stomach/Bowel: No bowel obstruction or ileus. Diverticulosis of the distal colon without evidence of diverticulitis. No bowel wall thickening or inflammatory change. Vascular/Lymphatic: IVC filter. Stable atherosclerosis of the aorta. No pathologic adenopathy. Reproductive: Status post hysterectomy. No adnexal masses. Other: No free fluid or free intraperitoneal gas. No abdominal wall hernia. Nerve stimulator is identified within the left lower quadrant anterior abdominal wall, lead entering the thecal sac at the L2 level and extending cephalad terminating at the T10 level unchanged. Musculoskeletal: No acute or destructive bony  abnormalities. Reconstructed images demonstrate no additional findings. IMPRESSION: 1. Postsurgical changes from median sternotomy and VSD repair as above. 2. Slight increase in the right pericardial fluid collection, which extends into the inferior margin of the sternotomy and subcutaneous tissues as above. Fluid extending through the subcutaneous tissues along the tracks of prior surgical drains. There is slight rim enhancement of the pericardial portion of the fluid collection, and superimposed infection cannot be excluded. 3. Trace bilateral pleural effusions. 4. No acute intra-abdominal or intrapelvic process. 5. Distal colonic diverticulosis without diverticulitis. 6.  Aortic Atherosclerosis (ICD10-I70.0). Electronically Signed   By: Sharlet Salina M.D.   On: 05/24/2023 17:45   ECHOCARDIOGRAM LIMITED  Result Date: 05/24/2023    ECHOCARDIOGRAM LIMITED REPORT   Patient Name:   Briza Annunziato Date of Exam: 05/24/2023 Medical Rec #:  [id number]   Height:       64.0 in Accession #:    [id number]  Weight:       213.2 lb Date of Birth:  October 02, 1966   BSA:          2.010 m Patient Age:    57 years    BP:           115/67 mmHg Patient Gender: F           HR:           76 bpm. Exam Location:  Inpatient Procedure: Limited Echo, Cardiac Doppler and Color Doppler Indications:    Status post ventricul  History:        Patient has prior history of Echocardiogram examinations, most                 recent 05/07/2023. STEMI, surgical VSD closure, chronic kidney                 disease, Arrythmias:WPW; Risk Factors:Sleep Apnea.  Sonographer:    Delcie Roch RDCS Referring Phys: [phone] ADITYA Gasper Lloyd  Sonographer Comments: Patient is obese and Technically difficult study due to poor echo windows. Image acquisition challenging due to patient body habitus. IMPRESSIONS  1. Technically limited images, but no clear residual VSD flow seen . Left ventricular ejection fraction, by estimation, is 60 to 65%. The left ventricle has  normal function. The left ventricle demonstrates regional wall motion abnormalities (see scoring  diagram/findings for description). There is akinesis of the left ventricular, apical apical segment and septal wall.  2. The inferior vena cava is normal in size with greater than 50% respiratory variability, suggesting right atrial pressure of 3 mmHg. FINDINGS  Left Ventricle: Technically limited images, but no clear residual VSD flow seen. Left ventricular ejection fraction, by estimation, is 60 to 65%. The left ventricle has normal function. The left ventricle demonstrates regional wall motion abnormalities. Pericardium: There is no evidence of pericardial effusion. Venous: The inferior vena cava is normal in size with greater than 50% respiratory variability, suggesting right atrial pressure of 3 mmHg. Additional Comments: Spectral Doppler performed. Color Doppler performed.  LEFT VENTRICLE PLAX 2D LVIDd:         3.90 cm LVIDs:         2.60 cm LV IVS:        0.90 cm  IVC IVC diam: 1.80 cm LEFT ATRIUM         Index LA diam:    3.70 cm 1.84 cm/m   AORTA Ao Asc diam: 3.30 cm MITRAL VALVE MV Area (PHT): 4.68 cm MV Decel Time: 162 msec MV E velocity: 87.00 cm/s MV A velocity: 71.30 cm/s MV E/A ratio:  1.22 Jodelle Red MD Electronically signed by Jodelle Red MD Signature Date/Time: 05/24/2023/4:55:53 PM    Final    DG Chest Port 1 View  Result Date: 05/24/2023 CLINICAL DATA:  [id number] CHF following cardiac surgery, postop [id number] EXAM: PORTABLE CHEST - 1 VIEW COMPARISON:  05/23/2023 FINDINGS: Lordotic projection. The mediastinal contours are within normal limits. Unchanged cardiomegaly. Similar appearance of left upper extremity PICC with the catheter tip near the cavoatrial junction. Low lung volumes. The lungs are otherwise clear bilaterally without evidence of focal consolidation, pleural effusion, or pneumothorax. No acute osseous abnormality. Similar appearance of median sternotomy wires.  IMPRESSION: 1. Examination is limited by lordotic positioning. 2. Low lung volumes. No acute cardiopulmonary process. 3. Unchanged cardiomegaly. Electronically Signed   By: Marliss Coots M.D.   On: 05/24/2023 08:11   DG CHEST PORT 1 VIEW  Result Date: 05/23/2023 CLINICAL DATA:  Pleural effusion, chest pain EXAM: PORTABLE CHEST 1 VIEW COMPARISON:  Prior chest x-ray 05/21/2023 FINDINGS: Stable cardiomegaly. Mediastinal contours are unchanged. Left upper extremity PICC in stable position with the tip at the cavoatrial junction. Mild vascular congestion without edema. Interval resolution of left-sided pleural effusion and associated basilar airspace opacity. There may be a small persistent right-sided pleural effusion with associated atelectasis. No pneumothorax. IMPRESSION: 1. Improved left basilar airspace opacity likely reflecting resolved pleural effusion and atelectasis. 2. Persistent mild blunting of the right costophrenic angle. Difficult to exclude a small right-sided pleural effusion and associated atelectasis. 3. Well-positioned left upper extremity PICC. 4. Cardiomegaly and pulmonary vascular congestion without edema. Electronically Signed   By: Malachy Moan M.D.   On: 05/23/2023 13:17   DG Chest Port 1 View  Result Date: 05/21/2023 CLINICAL DATA:  Status post device closure of VSD. EXAM: PORTABLE CHEST 1 VIEW COMPARISON:  Chest x-ray dated 05/19/2023. FINDINGS: Heart size and mediastinal contours are stable. Hazy opacity at the  LEFT lung base. No pneumothorax is seen. Median sternotomy wires appear intact and stable in alignment. LEFT-sided PICC line is stable in position with tip at the level of the lower SVC. IMPRESSION: 1. Hazy opacity at the LEFT lung base, atelectasis versus pneumonia, not significantly changed compared to the chest x-ray of 05/19/2023. 2. Probable small LEFT pleural effusion. Electronically Signed   By: Bary Richard M.D.   On: 05/21/2023 10:11   VAS [place] LOWER EXTREMITY  VENOUS (DVT)  Result Date: 05/20/2023  Lower Venous DVT Study Patient Name:  STASSI CARDOSI  Date of Exam:   05/20/2023 Medical Rec #: [id number]    Accession #:    [id number] Date of Birth: 1966/10/11    Patient Gender: F Patient Age:   49 years Exam Location:  [facility] Procedure:      VAS [place] LOWER EXTREMITY VENOUS (DVT) Referring Phys: Theron Arista VANTRIGT --------------------------------------------------------------------------------  Indications: Heaviness and weakness of right lower extremity. Other Indications: Patient had balloon pump in right groin prior to VSD repair. Limitations: Body habitus. Comparison Study: Prior negative bilateral LEV done 05/16/22 Performing Technologist: Sherren Kerns RVS  Examination Guidelines: A complete evaluation includes B-mode imaging, spectral Doppler, color Doppler, and power Doppler as needed of all accessible portions of each vessel. Bilateral testing is considered an integral part of a complete examination. Limited examinations for reoccurring indications may be performed as noted. The reflux portion of the exam is performed with the patient in reverse Trendelenburg.  +---------+---------------+---------+-----------+----------+-------------------+ RIGHT    CompressibilityPhasicitySpontaneityPropertiesThrombus Aging      +---------+---------------+---------+-----------+----------+-------------------+ CFV      Full           Yes      Yes                                      +---------+---------------+---------+-----------+----------+-------------------+ SFJ      Full                                                             +---------+---------------+---------+-----------+----------+-------------------+ FV Prox  Full                                                             +---------+---------------+---------+-----------+----------+-------------------+ FV Mid   Full                                                              +---------+---------------+---------+-----------+----------+-------------------+ FV DistalFull                                                             +---------+---------------+---------+-----------+----------+-------------------+ PFV      Full                                                             +---------+---------------+---------+-----------+----------+-------------------+  POP      Full           Yes                                               +---------+---------------+---------+-----------+----------+-------------------+ PTV                                                   Not well visualized +---------+---------------+---------+-----------+----------+-------------------+ PERO                                                  Not well visualized +---------+---------------+---------+-----------+----------+-------------------+   +----+---------------+---------+-----------+----------+--------------+ LEFTCompressibilityPhasicitySpontaneityPropertiesThrombus Aging +----+---------------+---------+-----------+----------+--------------+ CFV Full           Yes      Yes                                 +----+---------------+---------+-----------+----------+--------------+     Summary: RIGHT: - There is no evidence of deep vein thrombosis in the lower extremity.  - No cystic structure found in the popliteal fossa.  LEFT: - No evidence of common femoral vein obstruction.  *See table(s) above for measurements and observations. Electronically signed by Waverly Ferrari MD on 05/20/2023 at 5:02:23 PM.    Final    CT ABDOMEN PELVIS WO CONTRAST  Result Date: 05/20/2023 CLINICAL DATA:  Retroperitoneal hematoma, follow-up. Right leg pain. Question yeah EXAM: CT ABDOMEN AND PELVIS WITHOUT CONTRAST TECHNIQUE: Multidetector CT imaging of the abdomen and pelvis was performed following the standard protocol without IV contrast. RADIATION DOSE REDUCTION: This exam was  performed according to the departmental dose-optimization program which includes automated exposure control, adjustment of the mA and/or kV according to patient size and/or use of iterative reconstruction technique. COMPARISON:  CT of the abdomen and pelvis without contrast 05/10/2022 FINDINGS: Lower chest: Left greater than right pleural effusions are present. Associated atelectasis is present. The VSD repair is noted. Radiopaque patch is present along the distal ventricular septum and extends into the pericardial space. A small pericardial effusion is present. Hepatobiliary: No focal liver abnormality is seen. Status post cholecystectomy. No biliary dilatation. Pancreas: Unremarkable. No pancreatic ductal dilatation or surrounding inflammatory changes. Spleen: Normal in size without focal abnormality. Adrenals/Urinary Tract: Adrenal glands are unremarkable. Kidneys are normal, without renal calculi, focal lesion, or hydronephrosis. Bladder is unremarkable. Stomach/Bowel: The stomach and duodenum are within normal limits. The small bowel is unremarkable. Terminal ileum is within normal limits. Appendectomy noted. The ascending and transverse colon are within normal limits. The descending and sigmoid colon are normal. Vascular/Lymphatic: Atherosclerotic calcifications are present in the aorta and branch vessels without aneurysm. Minimal stranding is present about the catheter insertion site of the proximal right femoral artery. No associated hematoma is present. Reproductive: Status post hysterectomy. No adnexal masses. Other: No abdominal wall hernia or abnormality. No abdominopelvic ascites. Musculoskeletal: Mild subcutaneous edema is present. Asymmetric edema is noted adjacent to the right flank. No focal collection is present. The vertebral body heights and alignment are normal. No focal osseous lesions are present.  IMPRESSION: 1. Minimal stranding about the catheter insertion site of the proximal right femoral  artery. No associated hematoma is present. 2. Left greater than right pleural effusions with associated atelectasis. 3. VSD repair as described. 4. Small pericardial effusion likely within normal limits following surgery. 5.  Aortic Atherosclerosis (ICD10-I70.0). Electronically Signed   By: Marin Roberts M.D.   On: 05/20/2023 12:24   DG Chest Port 1 View  Result Date: 05/19/2023 CLINICAL DATA:  57 year old female status post median sternotomy. EXAM: PORTABLE CHEST 1 VIEW COMPARISON:  Chest x-ray 05/16/2023. FINDINGS: There is a left upper extremity PICC with tip terminating in the superior cavoatrial junction. Lung volumes are very low. Opacity at the left base which may reflect atelectasis and/or consolidation, with superimposed moderate left pleural effusion. Right lung appears clear. No right pleural effusion. No pneumothorax. No evidence of pulmonary edema. Heart size is mildly enlarged. The patient is rotated to the right on today's exam, resulting in distortion of the mediastinal contours and reduced diagnostic sensitivity and specificity for mediastinal pathology. Status post median sternotomy. IMPRESSION: 1. Support apparatus, as above. 2. Persistent atelectasis and/or consolidation in the left lung base with moderate left pleural effusion. 3. Mild cardiomegaly. Electronically Signed   By: Trudie Reed M.D.   On: 05/19/2023 09:32   DG Chest Port 1 View  Result Date: 05/16/2023 CLINICAL DATA:  Chest pain EXAM: PORTABLE CHEST 1 VIEW COMPARISON:  05/15/2023 FINDINGS: Left IJ sheath and left-sided PICC line remain in place. Mediastinal drains have been removed. Stable cardiomegaly. Persistent left greater than right airspace opacities with left pleural effusion. No pneumothorax. IMPRESSION: Stable radiographic appearance of the chest with persistent left greater than right airspace opacities with left pleural effusion. Electronically Signed   By: Duanne Guess D.O.   On: 05/16/2023 13:42    DG CHEST PORT 1 VIEW  Result Date: 05/15/2023 CLINICAL DATA:  Ventricular septal defect repair, pleural effusion EXAM: PORTABLE CHEST 1 VIEW COMPARISON:  Previous studies including the examination of 05/13/2023 FINDINGS: Transverse diameter of heart is increased. Increased density in left mid and left lower lung fields may be due to pleural effusion and underlying infiltrates. Central pulmonary vessels are less prominent. There is improvement in aeration in right lung. Mediastinal drains are noted. Tip of PICC line is noted in the course of superior vena cava. IMPRESSION: Cardiomegaly. There is interval decrease in pulmonary vascular congestion. Homogeneous opacity in left mid and left lower lung fields may be due to pleural effusion and underlying atelectasis/pneumonia. Electronically Signed   By: Ernie Avena M.D.   On: 05/15/2023 08:29   DG CHEST PORT 1 VIEW  Result Date: 05/13/2023 CLINICAL DATA:  Hypoxia. EXAM: PORTABLE CHEST 1 VIEW COMPARISON:  Chest radiograph dated 05/13/2023. FINDINGS: Interval removal of the Swan-Ganz catheter. Additional support lines as seen on the prior radiograph. Overall slight worsening of the vascular congestion since the prior radiograph. Similar left pleural effusion and left lung base atelectasis or infiltrate. No pneumothorax. Stable cardiomegaly. Median sternotomy wires. No acute osseous pathology. IMPRESSION: 1. Interval removal of the Swan-Ganz catheter. 2. Slight worsening of the vascular congestion. 3. Similar left pleural effusion and left lung base atelectasis or infiltrate. Electronically Signed   By: Elgie Collard M.D.   On: 05/13/2023 22:47   DG CHEST PORT 1 VIEW  Result Date: 05/13/2023 CLINICAL DATA:  97360 VSD (ventricular septal defect) [id number] Encounter for central line placement [id number] EXAM: PORTABLE CHEST - 1 VIEW COMPARISON:  the previous day's study FINDINGS:  Patient has been extubated and the gastric tube removed. Left IJ sheath,  left arm PICC, and right IJ Swan-Ganz stable position. Mediastinal drains stable. Low lung volumes with slightly improved right lung aeration but some worsening of left basilar consolidation/atelectasis. Mild cardiomegaly stable. Sternotomy wires. Small left effusion. IMPRESSION: 1. Interval extubation and removal of gastric tube. 2. Slightly improved right lung aeration but worsening left basilar consolidation/atelectasis. Electronically Signed   By: Corlis Leak M.D.   On: 05/13/2023 09:38    Microbiology No results found for this or any previous visit (from the past 240 hour(s)).  Lab Basic Metabolic Panel: No results for input(s): "NA", "K", "CL", "CO2", "GLUCOSE", "BUN", "CREATININE", "CALCIUM", "MG", "PHOS" in the last 168 hours. Liver Function Tests: No results for input(s): "AST", "ALT", "ALKPHOS", "BILITOT", "PROT", "ALBUMIN" in the last 168 hours. No results for input(s): "LIPASE", "AMYLASE" in the last 168 hours. No results for input(s): "AMMONIA" in the last 168 hours. CBC: No results for input(s): "WBC", "NEUTROABS", "HGB", "HCT", "MCV", "PLT" in the last 168 hours. Cardiac Enzymes: No results for input(s): "CKTOTAL", "CKMB", "CKMBINDEX", "TROPONINI" in the last 168 hours. Sepsis Labs: No results for input(s): "PROCALCITON", "WBC", "LATICACIDVEN" in the last 168 hours.   Corliss Skains 06/11/2023, 8:17 AM

## 2023-06-24 NOTE — Progress Notes (Signed)
      [address]       [place] [address]             [phone]      CTSP for code with massive bleeding  [name] had an episode of emesis accompanied by aspiration.  She went into PEA and CPR was initiated.  When Dr. Gala Romney arrived the [facility] was distended and when removed massive amount of blood noted. CPR continued without any pulse.  When I arrived CPR still ongoing.  Sternal retractor placed and the RV was ruptured along the entire anterior wall from the acute margin to just below the valve.  RV was completely empty and there were no cardiac contractions. This was a fatal event with no realistic chance of a successful repair and recovery.  Family was present in the ICU and are aware of futility of further attempts.   Salvatore Decent Dorris Fetch, MD [facility] [phone]

## 2023-06-24 NOTE — Progress Notes (Signed)
RT responded to Code Blue. CPR started by RNs. RT bag ventilated patient and assisted CCM MD with intubation. RTs continued to manually ventilate patient until TOD called.

## 2023-06-24 NOTE — Progress Notes (Signed)
I was called at bedside, after patient started vomiting, aspirated and became bradycardic leading to PEA arrest. CPR was in progress, patient is significantly critical, I immediately intubated patient, noticed food particles in airway, I suctioned airway and intubated the patient.  Advanced heart failure team came to the bedside, wound VAC was removed, patient was noted to have large amount of blood pooling into the surgical wound, blood was suctioned, TCTS came to the bedside, after placing a sternal retractor, it was noted patient's anterior wall of right ventricle was ruptured, unfortunately ROSC was not achieved and patient was declared dead at 1:04 PM.  Patient family was at bedside     Cheri Fowler, MD [facility] Pulmonary Critical Care See Amion for pager If no response to pager, please call [phone] until 7pm After 7pm, Please call E-link [phone]

## 2023-06-24 NOTE — Procedures (Signed)
Intubation Procedure Note  [name]  [id number]  1966-08-08  Date:06/23/2023  Time:1:14 PM   Provider Performing:     Procedure: Intubation (31500)  Indication(s) Respiratory Failure  Consent Risks of the procedure as well as the alternatives and risks of each were explained to the patient and/or caregiver.  Consent for the procedure was obtained and is signed in the bedside chart   Time Out Verified patient identification, verified procedure, site/side was marked, verified correct patient position, special equipment/implants available, medications/allergies/relevant history reviewed, required imaging and test results available.   Sterile Technique Usual hand hygeine, masks, and gloves were used   Procedure Description Patient positioned in bed supine.  Sedation given as noted above.  Patient was intubated with endotracheal tube using  MAC 4 .  View was Grade 1 full glottis .  Number of attempts was 1.  Colorimetric CO2 detector was consistent with tracheal placement.   Complications/Tolerance None; patient tolerated the procedure well. Chest X-ray is ordered to verify placement.   EBL Minimal   Specimen(s) None

## 2023-06-24 NOTE — Progress Notes (Signed)
NAME:  [name], MRN:  [id number], DOB:  September 30, 1966, LOS: 25 ADMISSION DATE:  05/01/2023, CONSULTATION DATE: 05/11/2023 REFERRING MD: Dr. Cliffton Asters, CHIEF COMPLAINT: Chest pain  History of Present Illness:  57 year old female with a diabetes melitis, complicated with neuropathy and Wegener's granulomatosis who was initially admitted with acute anterior STEMI underwent PCI to LAD, who continues to require vasopressor support and IABP, echocardiogram showed mechanical complication including apical VSD, underwent VSD repair, remained intubated, on multiple vasopressor support, PCCM was consulted for help evaluation medical management  Pertinent  Medical History   Past Medical History:  Diagnosis Date   Chronic cough    Chronic pain    Diabetes mellitus without complication (HCC)    Dyspnea    GERD (gastroesophageal reflux disease)    Hypokalemia    Hypothyroidism    Left wrist pain 06/01/2021   Myonecrosis (HCC) 07/01/2021   Neurocardiogenic syncope    OSA (obstructive sleep apnea)    does not use CPAP   Osteomyelitis (HCC)    bilateral feet   Peripheral neuropathy    Presence of intrathecal pump    recieves Prialt/bupivicaine   Tear of gluteus medius tendon 07/01/2021   Wegener's granulomatosis 2009   Wegner's disease (congenital syphilitic osteochondritis)      Significant Hospital Events: Including procedures, antibiotic start and stop dates in addition to other pertinent events   Repair of small VSD following myocardial infarction.  Interim History / Subjective:  Complaining of cough, unable to bring up phlegm and surgical site pain Wound VAC is 575 cc bloody drainage, unfortunately malfunctioned  Objective   Blood pressure 112/81, pulse (!) 59, temperature 98 F (36.7 C), temperature source Oral, resp. rate 10, height 5\' 4"  (1.626 m), weight 96.7 kg, SpO2 98%.        Intake/Output Summary (Last 24 hours) at 06/20/2023 0746 Last data filed at 06/15/2023 0630 Gross per 24  hour  Intake 1814.05 ml  Output 3745 ml  Net -1930.95 ml   Filed Weights   06/10/2023 0514 05/31/2023 0655 05/30/2023 0500  Weight: 99.2 kg 99.2 kg 96.7 kg    Examination: General: Middle-age obese female, lying on the bed HEENT: [place]/AT, eyes anicteric.  moist mucus membranes Neuro: Alert, awake following commands Chest: Wound VAC in place with bloody drainage, reduced air entry at the bases bilaterally Heart: Regular rate and rhythm, no murmurs or gallops Abdomen: Soft, nontender, nondistended, bowel sounds present Skin: No rash  Labs and images were reviewed  Assessment & Plan:  Coronary artery disease, presented with acute anterior STEMI, complicated with apical VSD. Apical VSD s/p surgical repair Sternal wound dehiscence s/p debridement and wound VAC placement S/p PCI to LAD with DES  Continue Plavix  Continue statin Continue pain control multimodality agents Closely monitor wound VAC   Acute HFrEF S/p IABP removal on 7/20 Patient ejection fraction is 45% Advanced heart failure team is following Continue spironolactone Currently not a candidate for for SGLT2i due to risk of infection  Paroxysmal A-fib Remain in sinus rhythm Continue amiodarone Continue heparin infusion  Acute respiratory failure with hypoxia/hypercapnia, due to pulmonary edema Continue to titrate nasal cannula oxygen Encourage incentive spirometry and ambulation  Possible HCAP Continue vancomycin and cefepime Cultures have been negative She remained afebrile White count is trending down   Acute kidney injury due to ischemic ATN Serum creatinine stable around 1.2-1.4 Avoid nephrotoxic agent Closely monitor electrolytes Serum sodium remained at 134 Hyperkalemia has resolved Continue aggressive electrolyte replacement   Diabetes type 2  Patient hemoglobin A1c is 7.6 Continue sliding scale insulin Continue Semglee 12 units twice daily   Expected perioperative blood loss anemia H&H and  platelet counts are stable now Patient has having increasing bloody discharge and wound VAC Monitor H&H and platelet count and transfuse per protocol   Morbid obesity OSA Diet and exercise counseling provided BiPAP overnight   Best Practice (right click and "Reselect all SmartList Selections" daily)   Diet/type: Regular consistency DVT prophylaxis: Systemic heparin GI prophylaxis: PPI Lines: PICC Foley:  Yes, and it is still needed Code Status:  full code Last date of multidisciplinary goals of care discussion [Per primary team]    Cheri Fowler, MD [place] [facility] See Amion for pager If no response to pager, please call [phone] until 7pm After 7pm, Please call E-link [phone]

## 2023-06-24 NOTE — Progress Notes (Signed)
OT Cancellation Note  Patient Details Name: [name] MRN: [id number] DOB: 1966-01-08   Cancelled Treatment:    Reason Eval/Treat Not Completed: Medical issues which prohibited therapy (Code Blue called for pt this PM, not appropriate for therapy at this time. Will follow up for OT tx as appropriate.)  Carver Fila, OTD, OTR/L SecureChat Preferred Acute Rehab [phone]   Dorene Grebe K Koonce 06/19/2023, 1:32 PM

## 2023-06-24 NOTE — Procedures (Signed)
Cardiopulmonary Resuscitation Note  [name]  [id number]  [birth year]  Date:06/07/2023  Time:1:15 PM   Provider Performing:    Procedure: Cardiopulmonary Resuscitation (92950)  Indication(s) Loss of Pulse  Consent N/A  Anesthesia N/A   Time Out N/A   Sterile Technique Hand hygiene, gloves   Procedure Description Called to patient's room for CODE BLUE. Initial rhythm was PEA/Asystole. Patient received high quality chest compressions for 12 minutes with defibrillation or cardioversion when appropriate. Epinephrine was administered every 3 minutes as directed by time Biomedical engineer. Additional pharmacologic interventions included sodium bicarbonate.  Return of spontaneous circulation was not achieved.  Family at bedside.   Complications/Tolerance N/A   EBL N/A   Specimen(s) N/A

## 2023-06-24 NOTE — Progress Notes (Signed)
PT Cancellation Note  Patient Details Name: [name] MRN: [id number] DOB: 28-Apr-1966   Cancelled Treatment:    Reason Eval/Treat Not Completed: Patient not medically ready. Pt underwent bedside procedure and wound vac change today. RN asked to hold this date. PT to return as able to progress mobility.  Lewis Shock, PT, DPT Acute Rehabilitation Services Secure chat preferred Office #: [phone]    Iona Hansen 06/20/2023, 10:45 AM

## 2023-06-24 DEATH — deceased
